# Patient Record
Sex: Male | Born: 1960
Health system: Southern US, Community
[De-identification: ages and names within clinical notes are randomized; demographics above are authoritative.]

## PROBLEM LIST (undated history)

## (undated) DIAGNOSIS — F419 Anxiety disorder, unspecified: Secondary | ICD-10-CM

## (undated) DIAGNOSIS — Z9889 Other specified postprocedural states: Secondary | ICD-10-CM

## (undated) DIAGNOSIS — F1010 Alcohol abuse, uncomplicated: Secondary | ICD-10-CM

## (undated) DIAGNOSIS — F10931 Alcohol use, unspecified with withdrawal delirium: Secondary | ICD-10-CM

## (undated) DIAGNOSIS — R251 Tremor, unspecified: Secondary | ICD-10-CM

## (undated) DIAGNOSIS — I491 Atrial premature depolarization: Secondary | ICD-10-CM

## (undated) DIAGNOSIS — I340 Nonrheumatic mitral (valve) insufficiency: Secondary | ICD-10-CM

## (undated) DIAGNOSIS — C8590 Non-Hodgkin lymphoma, unspecified, unspecified site: Secondary | ICD-10-CM

## (undated) DIAGNOSIS — F10231 Alcohol dependence with withdrawal delirium: Secondary | ICD-10-CM

## (undated) DIAGNOSIS — R011 Cardiac murmur, unspecified: Secondary | ICD-10-CM

## (undated) DIAGNOSIS — M1990 Unspecified osteoarthritis, unspecified site: Secondary | ICD-10-CM

## (undated) DIAGNOSIS — I2699 Other pulmonary embolism without acute cor pulmonale: Secondary | ICD-10-CM

## (undated) DIAGNOSIS — R0600 Dyspnea, unspecified: Secondary | ICD-10-CM

## (undated) DIAGNOSIS — I471 Supraventricular tachycardia: Secondary | ICD-10-CM

## (undated) DIAGNOSIS — F32A Depression, unspecified: Secondary | ICD-10-CM

## (undated) DIAGNOSIS — F329 Major depressive disorder, single episode, unspecified: Secondary | ICD-10-CM

## (undated) DIAGNOSIS — I441 Atrioventricular block, second degree: Secondary | ICD-10-CM

## (undated) DIAGNOSIS — N529 Male erectile dysfunction, unspecified: Secondary | ICD-10-CM

## (undated) DIAGNOSIS — I10 Essential (primary) hypertension: Secondary | ICD-10-CM

## (undated) HISTORY — DX: Other pulmonary embolism without acute cor pulmonale: I26.99

## (undated) HISTORY — DX: Non-Hodgkin lymphoma, unspecified, unspecified site: C85.90

## (undated) HISTORY — DX: Cardiac murmur, unspecified: R01.1

## (undated) HISTORY — DX: Male erectile dysfunction, unspecified: N52.9

## (undated) HISTORY — DX: Atrioventricular block, second degree: I44.1

## (undated) HISTORY — DX: Nonrheumatic mitral (valve) insufficiency: I34.0

## (undated) HISTORY — DX: Supraventricular tachycardia: I47.1

## (undated) HISTORY — PX: TRIGGER FINGER RELEASE: SHX641

## (undated) HISTORY — DX: Atrial premature depolarization: I49.1

## (undated) SURGERY — ECHOCARDIOGRAM, TRANSESOPHAGEAL
Anesthesia: Moderate Sedation

---

## 2003-10-21 ENCOUNTER — Encounter: Admission: RE | Admit: 2003-10-21 | Discharge: 2003-10-21 | Payer: Self-pay | Admitting: Family Medicine

## 2006-11-16 ENCOUNTER — Ambulatory Visit: Payer: Self-pay | Admitting: Family Medicine

## 2007-02-17 ENCOUNTER — Ambulatory Visit: Payer: Self-pay | Admitting: Family Medicine

## 2007-06-19 ENCOUNTER — Ambulatory Visit: Payer: Self-pay | Admitting: Family Medicine

## 2007-06-27 ENCOUNTER — Ambulatory Visit: Payer: Self-pay | Admitting: Family Medicine

## 2008-01-01 ENCOUNTER — Ambulatory Visit: Payer: Self-pay | Admitting: Family Medicine

## 2008-02-07 ENCOUNTER — Ambulatory Visit: Payer: Self-pay | Admitting: Family Medicine

## 2011-03-23 ENCOUNTER — Ambulatory Visit (INDEPENDENT_AMBULATORY_CARE_PROVIDER_SITE_OTHER): Payer: BC Managed Care – PPO | Admitting: Family Medicine

## 2011-03-23 ENCOUNTER — Encounter: Payer: Self-pay | Admitting: Family Medicine

## 2011-03-23 VITALS — BP 120/80 | HR 78 | Wt 186.0 lb

## 2011-03-23 DIAGNOSIS — N529 Male erectile dysfunction, unspecified: Secondary | ICD-10-CM

## 2011-03-23 DIAGNOSIS — H6120 Impacted cerumen, unspecified ear: Secondary | ICD-10-CM

## 2011-03-23 DIAGNOSIS — H9190 Unspecified hearing loss, unspecified ear: Secondary | ICD-10-CM

## 2011-03-23 NOTE — Progress Notes (Addendum)
  Subjective:    Patient ID: Leonard Bentley, male    DOB: 01/19/1961, 50 y.o.   MRN: [id number]  HPI he complains of decreased ability to here in the past has had cerumen impaction.  Would also like a refill on his Cialis.  Review of Systems     Objective:   Physical Exam Cerumen is noted in both canals and was easily removed. The TMs and canals are normal       Assessment & Plan:  Cerumen impaction ED Return here as needed.

## 2011-03-26 ENCOUNTER — Ambulatory Visit (INDEPENDENT_AMBULATORY_CARE_PROVIDER_SITE_OTHER): Payer: BC Managed Care – PPO | Admitting: Family Medicine

## 2011-03-26 ENCOUNTER — Encounter: Payer: Self-pay | Admitting: Family Medicine

## 2011-03-26 VITALS — BP 116/80 | HR 74 | Wt 186.0 lb

## 2011-03-26 DIAGNOSIS — E291 Testicular hypofunction: Secondary | ICD-10-CM

## 2011-03-26 DIAGNOSIS — M839 Adult osteomalacia, unspecified: Secondary | ICD-10-CM

## 2011-03-26 NOTE — Progress Notes (Signed)
  Subjective:    Patient ID: Leonard Bentley, male    DOB: 10-26-1961, 50 y.o.   MRN: [id number]  HPI he is here for a consultation concerning recent blood work which showed a low vitamin D level as well as a testosterone level of 300. He does complain of decreased energy as well as libido. He was placed on vitamin D 50,000 units. He also last weekend had a rather large amount of alcohol.   Review of Systems     Objective:   Physical Exam alert and in no distress otherwise not examined        Assessment & Plan:  Possible hypogonadism. Vitamin D. Deficiency. I discussed the diagnosis and treatment of hypogonadism. He will have this checked again in several weeks and have a morning reading. 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 He is to continue on vitamin D supplement as well as a multivitamin. Encouraged him to avoid excessive alcohol consumption. Over 20 minutes spent discussing these issues with him.

## 2011-03-26 NOTE — Patient Instructions (Signed)
Have your blood drawn at work for testosterone percent free testosterone. Use a good multivitamin. Don't waster money on protein supplements

## 2011-06-02 ENCOUNTER — Other Ambulatory Visit: Payer: BC Managed Care – PPO

## 2011-06-03 LAB — TESTOSTERONE, FREE, TOTAL, SHBG
Sex Hormone Binding: 44 nmol/L (ref 13–71)
Testosterone, Free: 52.1 pg/mL (ref 47.0–244.0)
Testosterone-% Free: 1.6 % (ref 1.6–2.9)
Testosterone: 316.63 ng/dL (ref 250–890)

## 2011-07-29 ENCOUNTER — Encounter: Payer: BC Managed Care – PPO | Admitting: Internal Medicine

## 2011-08-03 ENCOUNTER — Other Ambulatory Visit: Payer: Self-pay | Admitting: Internal Medicine

## 2011-08-03 ENCOUNTER — Encounter (HOSPITAL_BASED_OUTPATIENT_CLINIC_OR_DEPARTMENT_OTHER): Payer: BC Managed Care – PPO | Admitting: Internal Medicine

## 2011-08-03 DIAGNOSIS — C8190 Hodgkin lymphoma, unspecified, unspecified site: Secondary | ICD-10-CM

## 2011-08-03 DIAGNOSIS — C8581 Other specified types of non-Hodgkin lymphoma, lymph nodes of head, face, and neck: Secondary | ICD-10-CM

## 2011-08-03 LAB — CBC WITH DIFFERENTIAL/PLATELET
BASO%: 1.1 % (ref 0.0–2.0)
Basophils Absolute: 0 10*3/uL (ref 0.0–0.1)
EOS%: 0.4 % (ref 0.0–7.0)
Eosinophils Absolute: 0 10*3/uL (ref 0.0–0.5)
HCT: 37.2 % — ABNORMAL LOW (ref 38.4–49.9)
HGB: 12.3 g/dL — ABNORMAL LOW (ref 13.0–17.1)
LYMPH%: 22.1 % (ref 14.0–49.0)
MCH: 26.4 pg — ABNORMAL LOW (ref 27.2–33.4)
MCHC: 33.2 g/dL (ref 32.0–36.0)
MCV: 79.7 fL (ref 79.3–98.0)
MONO#: 0.5 10*3/uL (ref 0.1–0.9)
MONO%: 15.7 % — ABNORMAL HIGH (ref 0.0–14.0)
NEUT#: 2.1 10*3/uL (ref 1.5–6.5)
NEUT%: 60.7 % (ref 39.0–75.0)
Platelets: 233 10*3/uL (ref 140–400)
RBC: 4.66 10*6/uL (ref 4.20–5.82)
RDW: 15.8 % — ABNORMAL HIGH (ref 11.0–14.6)
WBC: 3.5 10*3/uL — ABNORMAL LOW (ref 4.0–10.3)
lymph#: 0.8 10*3/uL — ABNORMAL LOW (ref 0.9–3.3)

## 2011-08-03 LAB — LACTATE DEHYDROGENASE: LDH: 195 U/L (ref 94–250)

## 2011-08-03 LAB — COMPREHENSIVE METABOLIC PANEL
ALT: 32 U/L (ref 0–53)
AST: 52 U/L — ABNORMAL HIGH (ref 0–37)
Albumin: 4.3 g/dL (ref 3.5–5.2)
Alkaline Phosphatase: 34 U/L — ABNORMAL LOW (ref 39–117)
BUN: 12 mg/dL (ref 6–23)
CO2: 28 mEq/L (ref 19–32)
Calcium: 9.1 mg/dL (ref 8.4–10.5)
Chloride: 104 mEq/L (ref 96–112)
Creatinine, Ser: 0.92 mg/dL (ref 0.50–1.35)
Glucose, Bld: 83 mg/dL (ref 70–99)
Potassium: 4.2 mEq/L (ref 3.5–5.3)
Sodium: 142 mEq/L (ref 135–145)
Total Bilirubin: 0.5 mg/dL (ref 0.3–1.2)
Total Protein: 7 g/dL (ref 6.0–8.3)

## 2011-08-03 LAB — URIC ACID: Uric Acid, Serum: 4.6 mg/dL (ref 4.0–7.8)

## 2011-08-09 ENCOUNTER — Encounter (HOSPITAL_COMMUNITY)
Admission: RE | Admit: 2011-08-09 | Discharge: 2011-08-09 | Disposition: A | Discharge status: Home / Self Care | Payer: BC Managed Care – PPO | Source: Ambulatory Visit | Attending: Internal Medicine | Admitting: Internal Medicine

## 2011-08-09 DIAGNOSIS — C8190 Hodgkin lymphoma, unspecified, unspecified site: Secondary | ICD-10-CM

## 2011-08-09 DIAGNOSIS — C8589 Other specified types of non-Hodgkin lymphoma, extranodal and solid organ sites: Secondary | ICD-10-CM | POA: Insufficient documentation

## 2011-08-09 DIAGNOSIS — J984 Other disorders of lung: Secondary | ICD-10-CM | POA: Insufficient documentation

## 2011-08-09 DIAGNOSIS — C8590 Non-Hodgkin lymphoma, unspecified, unspecified site: Secondary | ICD-10-CM

## 2011-08-09 DIAGNOSIS — R599 Enlarged lymph nodes, unspecified: Secondary | ICD-10-CM | POA: Insufficient documentation

## 2011-08-09 MED ORDER — IOHEXOL 300 MG/ML  SOLN
100.0000 mL | Freq: Once | INTRAMUSCULAR | Status: AC | PRN
  Administered 2011-08-09: 100 mL via INTRAVENOUS

## 2011-08-09 MED ORDER — FLUDEOXYGLUCOSE F - 18 (FDG) INJECTION
15.2000 | Freq: Once | INTRAVENOUS | Status: AC | PRN
  Administered 2011-08-09: 15.2 via INTRAVENOUS

## 2011-08-10 ENCOUNTER — Encounter (HOSPITAL_BASED_OUTPATIENT_CLINIC_OR_DEPARTMENT_OTHER): Payer: BC Managed Care – PPO | Admitting: Internal Medicine

## 2011-08-10 ENCOUNTER — Other Ambulatory Visit: Payer: Self-pay | Admitting: Internal Medicine

## 2011-08-10 DIAGNOSIS — C8581 Other specified types of non-Hodgkin lymphoma, lymph nodes of head, face, and neck: Secondary | ICD-10-CM

## 2011-08-10 LAB — CBC WITH DIFFERENTIAL/PLATELET
BASO%: 0.6 % (ref 0.0–2.0)
Basophils Absolute: 0 10*3/uL (ref 0.0–0.1)
EOS%: 0.6 % (ref 0.0–7.0)
Eosinophils Absolute: 0 10*3/uL (ref 0.0–0.5)
HCT: 38.8 % (ref 38.4–49.9)
HGB: 12.7 g/dL — ABNORMAL LOW (ref 13.0–17.1)
LYMPH%: 14.1 % (ref 14.0–49.0)
MCH: 26 pg — ABNORMAL LOW (ref 27.2–33.4)
MCHC: 32.7 g/dL (ref 32.0–36.0)
MCV: 79.4 fL (ref 79.3–98.0)
MONO#: 0.5 10*3/uL (ref 0.1–0.9)
MONO%: 11.7 % (ref 0.0–14.0)
NEUT#: 3.1 10*3/uL (ref 1.5–6.5)
NEUT%: 73 % (ref 39.0–75.0)
Platelets: 181 10*3/uL (ref 140–400)
RBC: 4.89 10*6/uL (ref 4.20–5.82)
RDW: 15.5 % — ABNORMAL HIGH (ref 11.0–14.6)
WBC: 4.2 10*3/uL (ref 4.0–10.3)
lymph#: 0.6 10*3/uL — ABNORMAL LOW (ref 0.9–3.3)

## 2011-08-10 LAB — COMPREHENSIVE METABOLIC PANEL
ALT: 43 U/L (ref 0–53)
AST: 56 U/L — ABNORMAL HIGH (ref 0–37)
Albumin: 4.8 g/dL (ref 3.5–5.2)
Alkaline Phosphatase: 39 U/L (ref 39–117)
BUN: 17 mg/dL (ref 6–23)
CO2: 26 mEq/L (ref 19–32)
Calcium: 10 mg/dL (ref 8.4–10.5)
Chloride: 98 mEq/L (ref 96–112)
Creatinine, Ser: 1.06 mg/dL (ref 0.50–1.35)
Glucose, Bld: 94 mg/dL (ref 70–99)
Potassium: 4.3 mEq/L (ref 3.5–5.3)
Sodium: 140 mEq/L (ref 135–145)
Total Bilirubin: 1.1 mg/dL (ref 0.3–1.2)
Total Protein: 7.5 g/dL (ref 6.0–8.3)

## 2011-08-10 LAB — GLUCOSE, CAPILLARY: Glucose-Capillary: 104 mg/dL — ABNORMAL HIGH (ref 70–99)

## 2011-08-10 LAB — LACTATE DEHYDROGENASE: LDH: 224 U/L (ref 94–250)

## 2011-08-11 ENCOUNTER — Encounter: Payer: Self-pay | Admitting: Family Medicine

## 2011-08-11 ENCOUNTER — Ambulatory Visit (HOSPITAL_COMMUNITY)
Admission: RE | Admit: 2011-08-11 | Discharge: 2011-08-11 | Disposition: A | Discharge status: Home / Self Care | Payer: BC Managed Care – PPO | Source: Ambulatory Visit | Attending: Family Medicine | Admitting: Family Medicine

## 2011-08-11 DIAGNOSIS — I517 Cardiomegaly: Secondary | ICD-10-CM | POA: Insufficient documentation

## 2011-08-13 ENCOUNTER — Other Ambulatory Visit: Payer: Self-pay | Admitting: Internal Medicine

## 2011-08-13 ENCOUNTER — Ambulatory Visit (HOSPITAL_COMMUNITY): Payer: BC Managed Care – PPO | Attending: Internal Medicine

## 2011-08-13 DIAGNOSIS — C8589 Other specified types of non-Hodgkin lymphoma, extranodal and solid organ sites: Secondary | ICD-10-CM

## 2011-08-13 LAB — DIFFERENTIAL
Basophils Absolute: 0 10*3/uL (ref 0.0–0.1)
Basophils Relative: 1 % (ref 0–1)
Eosinophils Absolute: 0.1 10*3/uL (ref 0.0–0.7)
Eosinophils Relative: 2 % (ref 0–5)
Lymphocytes Relative: 20 % (ref 12–46)
Lymphs Abs: 0.9 10*3/uL (ref 0.7–4.0)
Monocytes Absolute: 0.6 10*3/uL (ref 0.1–1.0)
Monocytes Relative: 13 % — ABNORMAL HIGH (ref 3–12)
Neutro Abs: 2.8 10*3/uL (ref 1.7–7.7)
Neutrophils Relative %: 65 % (ref 43–77)

## 2011-08-13 LAB — CBC
HCT: 37.2 % — ABNORMAL LOW (ref 39.0–52.0)
Hemoglobin: 12.4 g/dL — ABNORMAL LOW (ref 13.0–17.0)
MCH: 25.7 pg — ABNORMAL LOW (ref 26.0–34.0)
MCHC: 33.3 g/dL (ref 30.0–36.0)
MCV: 77.2 fL — ABNORMAL LOW (ref 78.0–100.0)
Platelets: 179 10*3/uL (ref 150–400)
RBC: 4.82 MIL/uL (ref 4.22–5.81)
RDW: 15.8 % — ABNORMAL HIGH (ref 11.5–15.5)
WBC: 4.3 10*3/uL (ref 4.0–10.5)

## 2011-08-17 ENCOUNTER — Other Ambulatory Visit: Payer: Self-pay | Admitting: Internal Medicine

## 2011-08-17 ENCOUNTER — Encounter (HOSPITAL_BASED_OUTPATIENT_CLINIC_OR_DEPARTMENT_OTHER): Payer: BC Managed Care – PPO | Admitting: Internal Medicine

## 2011-08-17 DIAGNOSIS — C8581 Other specified types of non-Hodgkin lymphoma, lymph nodes of head, face, and neck: Secondary | ICD-10-CM

## 2011-08-17 DIAGNOSIS — Z5111 Encounter for antineoplastic chemotherapy: Secondary | ICD-10-CM

## 2011-08-17 LAB — CBC WITH DIFFERENTIAL/PLATELET
BASO%: 0.6 % (ref 0.0–2.0)
Basophils Absolute: 0 10*3/uL (ref 0.0–0.1)
EOS%: 0.4 % (ref 0.0–7.0)
Eosinophils Absolute: 0 10*3/uL (ref 0.0–0.5)
HCT: 38.9 % (ref 38.4–49.9)
HGB: 12.9 g/dL — ABNORMAL LOW (ref 13.0–17.1)
LYMPH%: 14.3 % (ref 14.0–49.0)
MCH: 25.5 pg — ABNORMAL LOW (ref 27.2–33.4)
MCHC: 33.2 g/dL (ref 32.0–36.0)
MCV: 77 fL — ABNORMAL LOW (ref 79.3–98.0)
MONO#: 0.6 10*3/uL (ref 0.1–0.9)
MONO%: 12 % (ref 0.0–14.0)
NEUT#: 3.5 10*3/uL (ref 1.5–6.5)
NEUT%: 72.7 % (ref 39.0–75.0)
Platelets: 158 10*3/uL (ref 140–400)
RBC: 5.05 10*6/uL (ref 4.20–5.82)
RDW: 15.6 % — ABNORMAL HIGH (ref 11.0–14.6)
WBC: 4.8 10*3/uL (ref 4.0–10.3)
lymph#: 0.7 10*3/uL — ABNORMAL LOW (ref 0.9–3.3)

## 2011-08-18 ENCOUNTER — Encounter (HOSPITAL_BASED_OUTPATIENT_CLINIC_OR_DEPARTMENT_OTHER): Payer: BC Managed Care – PPO | Admitting: Internal Medicine

## 2011-08-18 DIAGNOSIS — C8581 Other specified types of non-Hodgkin lymphoma, lymph nodes of head, face, and neck: Secondary | ICD-10-CM

## 2011-08-18 LAB — COMPREHENSIVE METABOLIC PANEL
ALT: 37 U/L (ref 0–53)
AST: 70 U/L — ABNORMAL HIGH (ref 0–37)
Albumin: 5.2 g/dL (ref 3.5–5.2)
Alkaline Phosphatase: 41 U/L (ref 39–117)
BUN: 16 mg/dL (ref 6–23)
CO2: 27 mEq/L (ref 19–32)
Calcium: 10.4 mg/dL (ref 8.4–10.5)
Chloride: 96 mEq/L (ref 96–112)
Creatinine, Ser: 1.09 mg/dL (ref 0.50–1.35)
Glucose, Bld: 85 mg/dL (ref 70–99)
Potassium: 4.4 mEq/L (ref 3.5–5.3)
Sodium: 136 mEq/L (ref 135–145)
Total Bilirubin: 1.2 mg/dL (ref 0.3–1.2)
Total Protein: 8.3 g/dL (ref 6.0–8.3)

## 2011-08-18 LAB — HEPATITIS PANEL, ACUTE
HCV Ab: NEGATIVE
Hep A IgM: NEGATIVE
Hep B C IgM: NEGATIVE
Hepatitis B Surface Ag: NEGATIVE

## 2011-08-18 LAB — URIC ACID: Uric Acid, Serum: 5.3 mg/dL (ref 4.0–7.8)

## 2011-08-18 LAB — HIV ANTIBODY (ROUTINE TESTING W REFLEX): HIV: NONREACTIVE

## 2011-08-18 LAB — LACTATE DEHYDROGENASE: LDH: 299 U/L — ABNORMAL HIGH (ref 94–250)

## 2011-08-24 ENCOUNTER — Encounter (HOSPITAL_BASED_OUTPATIENT_CLINIC_OR_DEPARTMENT_OTHER): Payer: BC Managed Care – PPO | Admitting: Internal Medicine

## 2011-08-24 ENCOUNTER — Other Ambulatory Visit: Payer: Self-pay | Admitting: Internal Medicine

## 2011-08-24 DIAGNOSIS — C8581 Other specified types of non-Hodgkin lymphoma, lymph nodes of head, face, and neck: Secondary | ICD-10-CM

## 2011-08-24 LAB — COMPREHENSIVE METABOLIC PANEL
ALT: 30 U/L (ref 0–53)
AST: 34 U/L (ref 0–37)
Albumin: 3.5 g/dL (ref 3.5–5.2)
Alkaline Phosphatase: 52 U/L (ref 39–117)
BUN: 28 mg/dL — ABNORMAL HIGH (ref 6–23)
CO2: 30 mEq/L (ref 19–32)
Calcium: 9.9 mg/dL (ref 8.4–10.5)
Chloride: 98 mEq/L (ref 96–112)
Creatinine, Ser: 1.21 mg/dL (ref 0.50–1.35)
Glucose, Bld: 110 mg/dL — ABNORMAL HIGH (ref 70–99)
Potassium: 3.7 mEq/L (ref 3.5–5.3)
Sodium: 137 mEq/L (ref 135–145)
Total Bilirubin: 0.7 mg/dL (ref 0.3–1.2)
Total Protein: 6.8 g/dL (ref 6.0–8.3)

## 2011-08-24 LAB — CBC WITH DIFFERENTIAL/PLATELET
BASO%: 0.8 % (ref 0.0–2.0)
Basophils Absolute: 0 10*3/uL (ref 0.0–0.1)
EOS%: 3.5 % (ref 0.0–7.0)
Eosinophils Absolute: 0.1 10*3/uL (ref 0.0–0.5)
HCT: 31.9 % — ABNORMAL LOW (ref 38.4–49.9)
HGB: 10.5 g/dL — ABNORMAL LOW (ref 13.0–17.1)
LYMPH%: 41.2 % (ref 14.0–49.0)
MCH: 26.4 pg — ABNORMAL LOW (ref 27.2–33.4)
MCHC: 32.9 g/dL (ref 32.0–36.0)
MCV: 80.2 fL (ref 79.3–98.0)
MONO#: 0.4 10*3/uL (ref 0.1–0.9)
MONO%: 18.3 % — ABNORMAL HIGH (ref 0.0–14.0)
NEUT#: 0.8 10*3/uL — ABNORMAL LOW (ref 1.5–6.5)
NEUT%: 36.2 % — ABNORMAL LOW (ref 39.0–75.0)
Platelets: 111 10*3/uL — ABNORMAL LOW (ref 140–400)
RBC: 3.98 10*6/uL — ABNORMAL LOW (ref 4.20–5.82)
RDW: 14.1 % (ref 11.0–14.6)
WBC: 2.1 10*3/uL — ABNORMAL LOW (ref 4.0–10.3)
lymph#: 0.9 10*3/uL (ref 0.9–3.3)

## 2011-08-24 LAB — URIC ACID: Uric Acid, Serum: 3.8 mg/dL — ABNORMAL LOW (ref 4.0–7.8)

## 2011-08-24 LAB — LACTATE DEHYDROGENASE: LDH: 195 U/L (ref 94–250)

## 2011-08-31 ENCOUNTER — Other Ambulatory Visit: Payer: Self-pay | Admitting: Internal Medicine

## 2011-08-31 ENCOUNTER — Encounter (HOSPITAL_BASED_OUTPATIENT_CLINIC_OR_DEPARTMENT_OTHER): Payer: BC Managed Care – PPO | Admitting: Internal Medicine

## 2011-08-31 DIAGNOSIS — C8581 Other specified types of non-Hodgkin lymphoma, lymph nodes of head, face, and neck: Secondary | ICD-10-CM

## 2011-08-31 LAB — COMPREHENSIVE METABOLIC PANEL
ALT: 32 U/L (ref 0–53)
AST: 46 U/L — ABNORMAL HIGH (ref 0–37)
Albumin: 4.4 g/dL (ref 3.5–5.2)
Alkaline Phosphatase: 62 U/L (ref 39–117)
BUN: 18 mg/dL (ref 6–23)
CO2: 28 mEq/L (ref 19–32)
Calcium: 9.4 mg/dL (ref 8.4–10.5)
Chloride: 103 mEq/L (ref 96–112)
Creatinine, Ser: 1.13 mg/dL (ref 0.50–1.35)
Glucose, Bld: 84 mg/dL (ref 70–99)
Potassium: 4.4 mEq/L (ref 3.5–5.3)
Sodium: 143 mEq/L (ref 135–145)
Total Bilirubin: 0.3 mg/dL (ref 0.3–1.2)
Total Protein: 6.6 g/dL (ref 6.0–8.3)

## 2011-08-31 LAB — CBC WITH DIFFERENTIAL/PLATELET
BASO%: 1 % (ref 0.0–2.0)
Basophils Absolute: 0.1 10*3/uL (ref 0.0–0.1)
EOS%: 0 % (ref 0.0–7.0)
Eosinophils Absolute: 0 10*3/uL (ref 0.0–0.5)
HCT: 34.1 % — ABNORMAL LOW (ref 38.4–49.9)
HGB: 11.1 g/dL — ABNORMAL LOW (ref 13.0–17.1)
LYMPH%: 11.4 % — ABNORMAL LOW (ref 14.0–49.0)
MCH: 25.5 pg — ABNORMAL LOW (ref 27.2–33.4)
MCHC: 32.6 g/dL (ref 32.0–36.0)
MCV: 78.2 fL — ABNORMAL LOW (ref 79.3–98.0)
MONO#: 0.8 10*3/uL (ref 0.1–0.9)
MONO%: 11.3 % (ref 0.0–14.0)
NEUT#: 5.3 10*3/uL (ref 1.5–6.5)
NEUT%: 76.3 % — ABNORMAL HIGH (ref 39.0–75.0)
Platelets: 154 10*3/uL (ref 140–400)
RBC: 4.36 10*6/uL (ref 4.20–5.82)
RDW: 14.6 % (ref 11.0–14.6)
WBC: 7 10*3/uL (ref 4.0–10.3)
lymph#: 0.8 10*3/uL — ABNORMAL LOW (ref 0.9–3.3)
nRBC: 22 % — ABNORMAL HIGH (ref 0–0)

## 2011-08-31 LAB — URIC ACID: Uric Acid, Serum: 3.2 mg/dL — ABNORMAL LOW (ref 4.0–7.8)

## 2011-08-31 LAB — LACTATE DEHYDROGENASE: LDH: 279 U/L — ABNORMAL HIGH (ref 94–250)

## 2011-09-07 ENCOUNTER — Encounter (HOSPITAL_BASED_OUTPATIENT_CLINIC_OR_DEPARTMENT_OTHER): Payer: BC Managed Care – PPO | Admitting: Internal Medicine

## 2011-09-07 ENCOUNTER — Other Ambulatory Visit: Payer: Self-pay | Admitting: Internal Medicine

## 2011-09-07 DIAGNOSIS — C8581 Other specified types of non-Hodgkin lymphoma, lymph nodes of head, face, and neck: Secondary | ICD-10-CM

## 2011-09-07 DIAGNOSIS — Z5111 Encounter for antineoplastic chemotherapy: Secondary | ICD-10-CM

## 2011-09-07 LAB — CBC WITH DIFFERENTIAL/PLATELET
BASO%: 1.2 % (ref 0.0–2.0)
Basophils Absolute: 0.1 10*3/uL (ref 0.0–0.1)
EOS%: 0 % (ref 0.0–7.0)
Eosinophils Absolute: 0 10*3/uL (ref 0.0–0.5)
HCT: 35.8 % — ABNORMAL LOW (ref 38.4–49.9)
HGB: 11.6 g/dL — ABNORMAL LOW (ref 13.0–17.1)
LYMPH%: 16.5 % (ref 14.0–49.0)
MCH: 25.2 pg — ABNORMAL LOW (ref 27.2–33.4)
MCHC: 32.4 g/dL (ref 32.0–36.0)
MCV: 77.7 fL — ABNORMAL LOW (ref 79.3–98.0)
MONO#: 0.8 10*3/uL (ref 0.1–0.9)
MONO%: 16.5 % — ABNORMAL HIGH (ref 0.0–14.0)
NEUT#: 3.2 10*3/uL (ref 1.5–6.5)
NEUT%: 65.8 % (ref 39.0–75.0)
Platelets: 209 10*3/uL (ref 140–400)
RBC: 4.61 10*6/uL (ref 4.20–5.82)
RDW: 15.2 % — ABNORMAL HIGH (ref 11.0–14.6)
WBC: 4.9 10*3/uL (ref 4.0–10.3)
lymph#: 0.8 10*3/uL — ABNORMAL LOW (ref 0.9–3.3)
nRBC: 2 % — ABNORMAL HIGH (ref 0–0)

## 2011-09-07 LAB — COMPREHENSIVE METABOLIC PANEL
ALT: 24 U/L (ref 0–53)
AST: 33 U/L (ref 0–37)
Albumin: 4.1 g/dL (ref 3.5–5.2)
Alkaline Phosphatase: 46 U/L (ref 39–117)
BUN: 14 mg/dL (ref 6–23)
CO2: 25 mEq/L (ref 19–32)
Calcium: 9.5 mg/dL (ref 8.4–10.5)
Chloride: 101 mEq/L (ref 96–112)
Creatinine, Ser: 0.88 mg/dL (ref 0.50–1.35)
Glucose, Bld: 137 mg/dL — ABNORMAL HIGH (ref 70–99)
Potassium: 4.3 mEq/L (ref 3.5–5.3)
Sodium: 137 mEq/L (ref 135–145)
Total Bilirubin: 0.4 mg/dL (ref 0.3–1.2)
Total Protein: 6.6 g/dL (ref 6.0–8.3)

## 2011-09-07 LAB — URIC ACID: Uric Acid, Serum: 3.3 mg/dL — ABNORMAL LOW (ref 4.0–7.8)

## 2011-09-07 LAB — LACTATE DEHYDROGENASE: LDH: 213 U/L (ref 94–250)

## 2011-09-08 ENCOUNTER — Encounter (HOSPITAL_BASED_OUTPATIENT_CLINIC_OR_DEPARTMENT_OTHER): Payer: BC Managed Care – PPO | Admitting: Internal Medicine

## 2011-09-08 DIAGNOSIS — C8581 Other specified types of non-Hodgkin lymphoma, lymph nodes of head, face, and neck: Secondary | ICD-10-CM

## 2011-09-13 NOTE — Progress Notes (Unsigned)
CC:   Rocco Serene, M.D. Sharlot Gowda, M.D.  PRINCIPAL DIAGNOSIS:  Stage II, large B-cell non-Hodgkin's lymphoma diagnosed in September 2012 with a scalp lesion.  In addition, mediastinal lymphadenopathy.  CURRENT THERAPY:  Systemic chemotherapy with CHOP/Rituxan given every 3 weeks with Neulasta support, status post 1 cycle.  INTERIM HISTORY:  Mr. Legate presents to proceed with cycle #2 of his systemic chemotherapy with CHOP/Rituxan.  Overall, he has been doing well.  He states that the scalp lesion is now flush to his scalp.  There is no more lump and it is nontender.  He is having some issues with constipation but is doing some dietary changes to adjust for that.  He has not had any problems with night sweats, fever, or chills.  He is still able to work out but is working at about 2/3 effort.  He is accompanied by his significant other, Bonita Quin, today.  He does report a pain that after the Neulasta injection that lasted for approximately 8 days but was well handled with ibuprofen.  REVIEW OF SYMPTOMS:  Significant for constipation as well as some, some generalized pain after the Neulasta injection.  Remainder of the review of systems is negative.  PERFORMANCE STATUS:  ECOG 1.  PHYSICAL EXAMINATION:  Blood pressure 138/92 with a pulse of 68, temperature 98.2.  The patient is seen in the infusion area. General: Exam reveals a very pleasant 50 year old [place] American male who is awake, alert, no acute distress.  HEENT:  Normocephalic, atraumatic. Pupils equal, round, reactive to light.  Sclerae anicteric.  Oropharynx clear.  No evidence of thrush or mucositis.  Neck:  Supple without palpable lymphadenopathy.  No jugular venous distention is present. Respiratory:  Clear to auscultation.  No wheezes, rales or rhonchi. Cardiovascular:  Normal S1, S2 with a regular rate and rhythm.  No appreciable murmurs, gallops, rubs.  Gastrointestinal:  Soft, nontender, nondistended abdomen  without masses or organomegaly.  Lower extremities: Without point tenderness, pitting edema or palpable cords.  LABORATORY DATA:  White count of 4.9 with an ANC of 3.2, hemoglobin 11.6, hematocrit 35.8, platelets 209.  CMET, LDH and uric acid are pending.  ASSESSMENT AND PLAN:  This is a very pleasant 50 year old African American male recently diagnosed with stage II, large B-cell non- Hodgkin's lymphoma from a biopsy of the scalp nodule.  He also had mediastinal and bilateral hilar adenopathy.  The patient was discussed with Dr. Arbutus Ped.  He will proceed with cycle #2 of his systemic chemotherapy with CHOP/Rituxan as scheduled.  He will return in 3 weeks prior to cycle #3, at which time we will set him up to have a CT of the brain, chest, abdomen and pelvis with contrast to re-evaluate his disease after she has completed his 3rd cycle of systemic chemotherapy with CHOP/Rituxan.  He will continued to have weekly labs drawn consisting of a CBC, differential, and CMET and when he returns in 3 weeks with a CBC, differential, CMET, LDH and uric acid.    ______________________________ Tiana Loft, PA-C AJ/MEDQ  D:  09/09/2011  T:  09/13/2011  Job:  [id number]

## 2011-09-14 ENCOUNTER — Other Ambulatory Visit: Payer: Self-pay | Admitting: Internal Medicine

## 2011-09-14 ENCOUNTER — Telehealth: Payer: Self-pay | Admitting: Internal Medicine

## 2011-09-14 ENCOUNTER — Other Ambulatory Visit (HOSPITAL_BASED_OUTPATIENT_CLINIC_OR_DEPARTMENT_OTHER): Payer: BC Managed Care – PPO

## 2011-09-14 DIAGNOSIS — C8581 Other specified types of non-Hodgkin lymphoma, lymph nodes of head, face, and neck: Secondary | ICD-10-CM

## 2011-09-14 LAB — CBC WITH DIFFERENTIAL/PLATELET
BASO%: 2.2 % — ABNORMAL HIGH (ref 0.0–2.0)
Basophils Absolute: 0 10*3/uL (ref 0.0–0.1)
EOS%: 1.7 % (ref 0.0–7.0)
Eosinophils Absolute: 0 10*3/uL (ref 0.0–0.5)
HCT: 30.9 % — ABNORMAL LOW (ref 38.4–49.9)
HGB: 10.2 g/dL — ABNORMAL LOW (ref 13.0–17.1)
LYMPH%: 31.1 % (ref 14.0–49.0)
MCH: 25.3 pg — ABNORMAL LOW (ref 27.2–33.4)
MCHC: 33 g/dL (ref 32.0–36.0)
MCV: 76.7 fL — ABNORMAL LOW (ref 79.3–98.0)
MONO#: 0.2 10*3/uL (ref 0.1–0.9)
MONO%: 10.6 % (ref 0.0–14.0)
NEUT#: 1 10*3/uL — ABNORMAL LOW (ref 1.5–6.5)
NEUT%: 54.4 % (ref 39.0–75.0)
Platelets: 98 10*3/uL — ABNORMAL LOW (ref 140–400)
RBC: 4.03 10*6/uL — ABNORMAL LOW (ref 4.20–5.82)
RDW: 14.4 % (ref 11.0–14.6)
WBC: 1.8 10*3/uL — ABNORMAL LOW (ref 4.0–10.3)
lymph#: 0.6 10*3/uL — ABNORMAL LOW (ref 0.9–3.3)
nRBC: 0 % (ref 0–0)

## 2011-09-14 LAB — COMPREHENSIVE METABOLIC PANEL
ALT: 22 U/L (ref 0–53)
AST: 21 U/L (ref 0–37)
Albumin: 3.5 g/dL (ref 3.5–5.2)
Alkaline Phosphatase: 67 U/L (ref 39–117)
BUN: 23 mg/dL (ref 6–23)
CO2: 32 mEq/L (ref 19–32)
Calcium: 9.4 mg/dL (ref 8.4–10.5)
Chloride: 100 mEq/L (ref 96–112)
Creatinine, Ser: 1.02 mg/dL (ref 0.50–1.35)
Glucose, Bld: 89 mg/dL (ref 70–99)
Potassium: 3.5 mEq/L (ref 3.5–5.3)
Sodium: 140 mEq/L (ref 135–145)
Total Bilirubin: 0.4 mg/dL (ref 0.3–1.2)
Total Protein: 6.7 g/dL (ref 6.0–8.3)

## 2011-09-14 LAB — URIC ACID: Uric Acid, Serum: 3.9 mg/dL — ABNORMAL LOW (ref 4.0–7.8)

## 2011-09-14 LAB — LACTATE DEHYDROGENASE: LDH: 198 U/L (ref 94–250)

## 2011-09-14 NOTE — Progress Notes (Signed)
Quick Note:  Call patient with the result and advise for Neutropenic precautions. ______

## 2011-09-14 NOTE — Telephone Encounter (Signed)
Left message for pt

## 2011-09-21 ENCOUNTER — Other Ambulatory Visit: Payer: BC Managed Care – PPO

## 2011-09-25 ENCOUNTER — Other Ambulatory Visit: Payer: Self-pay | Admitting: Physician Assistant

## 2011-09-25 ENCOUNTER — Other Ambulatory Visit: Payer: Self-pay | Admitting: Internal Medicine

## 2011-09-25 ENCOUNTER — Encounter: Payer: Self-pay | Admitting: Physician Assistant

## 2011-09-25 DIAGNOSIS — C8590 Non-Hodgkin lymphoma, unspecified, unspecified site: Secondary | ICD-10-CM

## 2011-09-25 DIAGNOSIS — Z8572 Personal history of non-Hodgkin lymphomas: Secondary | ICD-10-CM | POA: Insufficient documentation

## 2011-09-25 HISTORY — DX: Non-Hodgkin lymphoma, unspecified, unspecified site: C85.90

## 2011-09-27 ENCOUNTER — Other Ambulatory Visit: Payer: Self-pay | Admitting: Internal Medicine

## 2011-09-27 ENCOUNTER — Other Ambulatory Visit (HOSPITAL_BASED_OUTPATIENT_CLINIC_OR_DEPARTMENT_OTHER): Payer: BC Managed Care – PPO

## 2011-09-27 ENCOUNTER — Ambulatory Visit (HOSPITAL_BASED_OUTPATIENT_CLINIC_OR_DEPARTMENT_OTHER): Payer: BC Managed Care – PPO | Admitting: Physician Assistant

## 2011-09-27 VITALS — BP 137/90 | HR 69 | Temp 98.8°F | Ht 70.0 in | Wt 184.1 lb

## 2011-09-27 DIAGNOSIS — R599 Enlarged lymph nodes, unspecified: Secondary | ICD-10-CM

## 2011-09-27 DIAGNOSIS — C8581 Other specified types of non-Hodgkin lymphoma, lymph nodes of head, face, and neck: Secondary | ICD-10-CM

## 2011-09-27 DIAGNOSIS — C8590 Non-Hodgkin lymphoma, unspecified, unspecified site: Secondary | ICD-10-CM

## 2011-09-27 LAB — COMPREHENSIVE METABOLIC PANEL
ALT: 24 U/L (ref 0–53)
AST: 31 U/L (ref 0–37)
Albumin: 3.9 g/dL (ref 3.5–5.2)
Alkaline Phosphatase: 59 U/L (ref 39–117)
BUN: 20 mg/dL (ref 6–23)
CO2: 28 mEq/L (ref 19–32)
Calcium: 9.6 mg/dL (ref 8.4–10.5)
Chloride: 99 mEq/L (ref 96–112)
Creatinine, Ser: 0.87 mg/dL (ref 0.50–1.35)
Glucose, Bld: 86 mg/dL (ref 70–99)
Potassium: 3.9 mEq/L (ref 3.5–5.3)
Sodium: 136 mEq/L (ref 135–145)
Total Bilirubin: 0.2 mg/dL — ABNORMAL LOW (ref 0.3–1.2)
Total Protein: 7.1 g/dL (ref 6.0–8.3)

## 2011-09-27 LAB — URIC ACID: Uric Acid, Serum: 3.8 mg/dL — ABNORMAL LOW (ref 4.0–7.8)

## 2011-09-27 LAB — CBC WITH DIFFERENTIAL/PLATELET
BASO%: 0.5 % (ref 0.0–2.0)
Basophils Absolute: 0 10*3/uL (ref 0.0–0.1)
EOS%: 0 % (ref 0.0–7.0)
Eosinophils Absolute: 0 10*3/uL (ref 0.0–0.5)
HCT: 32.5 % — ABNORMAL LOW (ref 38.4–49.9)
HGB: 10.6 g/dL — ABNORMAL LOW (ref 13.0–17.1)
LYMPH%: 12 % — ABNORMAL LOW (ref 14.0–49.0)
MCH: 26 pg — ABNORMAL LOW (ref 27.2–33.4)
MCHC: 32.7 g/dL (ref 32.0–36.0)
MCV: 79.7 fL (ref 79.3–98.0)
MONO#: 0.7 10*3/uL (ref 0.1–0.9)
MONO%: 11.9 % (ref 0.0–14.0)
NEUT#: 4.2 10*3/uL (ref 1.5–6.5)
NEUT%: 75.6 % — ABNORMAL HIGH (ref 39.0–75.0)
Platelets: 203 10*3/uL (ref 140–400)
RBC: 4.08 10*6/uL — ABNORMAL LOW (ref 4.20–5.82)
RDW: 16.3 % — ABNORMAL HIGH (ref 11.0–14.6)
WBC: 5.6 10*3/uL (ref 4.0–10.3)
lymph#: 0.7 10*3/uL — ABNORMAL LOW (ref 0.9–3.3)

## 2011-09-27 LAB — LACTATE DEHYDROGENASE: LDH: 279 U/L — ABNORMAL HIGH (ref 94–250)

## 2011-09-28 ENCOUNTER — Ambulatory Visit (HOSPITAL_BASED_OUTPATIENT_CLINIC_OR_DEPARTMENT_OTHER): Payer: BC Managed Care – PPO

## 2011-09-28 VITALS — BP 121/82 | HR 75 | Temp 98.6°F | Ht 70.0 in | Wt 184.0 lb

## 2011-09-28 DIAGNOSIS — C8581 Other specified types of non-Hodgkin lymphoma, lymph nodes of head, face, and neck: Secondary | ICD-10-CM

## 2011-09-28 DIAGNOSIS — Z5111 Encounter for antineoplastic chemotherapy: Secondary | ICD-10-CM

## 2011-09-28 DIAGNOSIS — Z5112 Encounter for antineoplastic immunotherapy: Secondary | ICD-10-CM

## 2011-09-28 DIAGNOSIS — C8590 Non-Hodgkin lymphoma, unspecified, unspecified site: Secondary | ICD-10-CM

## 2011-09-28 MED ORDER — ONDANSETRON 16 MG/50ML IVPB (CHCC)
16.0000 mg | Freq: Once | INTRAVENOUS | Status: AC
  Administered 2011-09-28: 16 mg via INTRAVENOUS

## 2011-09-28 MED ORDER — VINCRISTINE SULFATE CHEMO INJECTION 1 MG/ML
2.0000 mg | Freq: Once | INTRAVENOUS | Status: AC
  Administered 2011-09-28: 2 mg via INTRAVENOUS
  Filled 2011-09-28: qty 2

## 2011-09-28 MED ORDER — SODIUM CHLORIDE 0.9 % IV SOLN
750.0000 mg/m2 | Freq: Once | INTRAVENOUS | Status: AC
  Administered 2011-09-28: 1520 mg via INTRAVENOUS
  Filled 2011-09-28: qty 76

## 2011-09-28 MED ORDER — DIPHENHYDRAMINE HCL 25 MG PO CAPS
50.0000 mg | ORAL_CAPSULE | Freq: Once | ORAL | Status: AC
  Administered 2011-09-28: 50 mg via ORAL

## 2011-09-28 MED ORDER — SODIUM CHLORIDE 0.9 % IV SOLN: 375.0000 mg/m2 | Freq: Once | INTRAVENOUS | Status: DC

## 2011-09-28 MED ORDER — SODIUM CHLORIDE 0.9 % IV SOLN
375.0000 mg/m2 | Freq: Once | INTRAVENOUS | Status: AC
  Administered 2011-09-28: 800 mg via INTRAVENOUS
  Filled 2011-09-28: qty 80

## 2011-09-28 MED ORDER — SODIUM CHLORIDE 0.9 % IV SOLN
Freq: Once | INTRAVENOUS | Status: AC
  Administered 2011-09-28: 09:00:00 via INTRAVENOUS

## 2011-09-28 MED ORDER — DOXORUBICIN HCL CHEMO IV INJECTION 2 MG/ML
50.0000 mg/m2 | Freq: Once | INTRAVENOUS | Status: AC
  Administered 2011-09-28: 102 mg via INTRAVENOUS
  Filled 2011-09-28: qty 51

## 2011-09-28 MED ORDER — DEXAMETHASONE SODIUM PHOSPHATE 4 MG/ML IJ SOLN
20.0000 mg | Freq: Once | INTRAMUSCULAR | Status: AC
Start: 2011-09-28 — End: 2011-09-28
  Administered 2011-09-28: 20 mg via INTRAVENOUS

## 2011-09-28 MED ORDER — ACETAMINOPHEN 325 MG PO TABS
650.0000 mg | ORAL_TABLET | Freq: Once | ORAL | Status: AC
  Administered 2011-09-28: 650 mg via ORAL

## 2011-09-28 NOTE — Patient Instructions (Signed)
Pt instructed to call for any concerns

## 2011-09-28 NOTE — Progress Notes (Signed)
No images are attached to the encounter. No scans are attached to the encounter. No scans are attached to the encounter. [facility] OFFICE PROGRESS NOTE  Carollee Herter, MD, MD [address] [place] [address]  DIAGNOSIS: Stage II, large B-cell non-Hodgkin's lymphoma diagnosed in September 2012 with a scalp lesion. In addition he had mediastinal lymphadenopathy.  PRIOR THERAPY: None   CURRENT THERAPY: Systemic chemotherapy with CHOP/Rituxan, given every 3 weeks with Neulasta support, status post 2 cycles  INTERVAL HISTORY: Leonard Bentley 50 y.o. male returns for a scheduled regular office visit for followup of his stage II large B cell non-Hodgkin's lymphoma. In general he is tolerating his systemic chemotherapy with CHOP/Rituxan relatively well. He has noticed over the past month that his nose is running more with clear secretions. This is not associated with any fever or chills. He also is aware that his personal body scent has changed since he began chemotherapy. He will be due for restaging scans after this cycle of CHOP/Rituxan. He states he does get some nervousness last anxiousness with the imaging scanning.   MEDICAL HISTORY: Past Medical History  Diagnosis Date  . ED (erectile dysfunction)   . Cancer 07/2011    STAGE II NON- HODGKIN LYMPHOMA  . Lymphoma, non Hodgkin's 09/25/2011    ALLERGIES:   has no known allergies.  MEDICATIONS:  Current Outpatient Prescriptions  Medication Sig Dispense Refill  . allopurinol (ZYLOPRIM) 100 MG tablet Take 100 mg by mouth 3 (three) times daily.        . predniSONE (DELTASONE) 50 MG tablet Take 50 mg by mouth 2 (two) times daily. TAKE 100 MG PO DAILY FOR FIRST 5 DAYS OF EACH CHEM. CYCLE.       Marland Kitchen prochlorperazine (COMPAZINE) 10 MG tablet Take 10 mg by mouth every 6 (six) hours as needed.        . tadalafil (CIALIS) 20 MG tablet Take 20 mg by mouth daily as needed.        No current facility-administered  medications for this visit.   Facility-Administered Medications Ordered in Other Visits  Medication Dose Route Frequency Provider Last Rate Last Dose  . 0.9 %  sodium chloride infusion   Intravenous Once Mohamed K. Mohamed, MD      . acetaminophen (TYLENOL) tablet 650 mg  650 mg Oral Once Mohamed K. Mohamed, MD   650 mg at 09/28/11 1005  . cyclophosphamide (CYTOXAN) 1,520 mg in sodium chloride 0.9 % 250 mL chemo infusion  750 mg/m2 (Treatment Plan Actual) Intravenous Once Mohamed K. Mohamed, MD   1,520 mg at 09/28/11 1045  . dexamethasone (DECADRON) injection 20 mg  20 mg Intravenous Once Mohamed K. Mohamed, MD   20 mg at 09/28/11 1914  . diphenhydrAMINE (BENADRYL) capsule 50 mg  50 mg Oral Once Mohamed K. Mohamed, MD   50 mg at 09/28/11 1054  . DOXOrubicin (ADRIAMYCIN) chemo injection 102 mg  50 mg/m2 (Treatment Plan Actual) Intravenous Once Mohamed K. Mohamed, MD   102 mg at 09/28/11 1021  . ondansetron (ZOFRAN) IVPB 16 mg  16 mg Intravenous Once Mohamed K. Mohamed, MD   16 mg at 09/28/11 7829  . riTUXimab (RITUXAN) 800 mg in sodium chloride 0.9 % 170 mL chemo infusion  375 mg/m2 Intravenous Once Mohamed K. Mohamed, MD   800 mg at 09/28/11 1155  . vinCRIStine (ONCOVIN) 2 mg in sodium chloride 0.9 % 18 mL chemo injection  2 mg Intravenous Once Mohamed K. Arbutus Ped, MD  2 mg at 09/28/11 1040  . DISCONTD: riTUXimab (RITUXAN) 800 mg in sodium chloride 0.9 % 250 mL chemo infusion  375 mg/m2 (Treatment Plan Actual) Intravenous Once Mohamed K. Arbutus Ped, MD        SURGICAL HISTORY: No past surgical history on file.  REVIEW OF SYSTEMS:  A comprehensive review of systems was negative except for: Ears, nose, mouth, throat, and face: positive for Increase in clear ready secretions from the nose   PHYSICAL EXAMINATION: General appearance: alert, cooperative and no distress Head: Normocephalic, without obvious abnormality, atraumatic Neck: no adenopathy, no carotid bruit, no JVD, supple, symmetrical, trachea  midline and thyroid not enlarged, symmetric, no tenderness/mass/nodules Lymph nodes: Cervical, supraclavicular, and axillary nodes normal. Resp: clear to auscultation bilaterally Cardio: regular rate and rhythm, S1, S2 normal, no murmur, click, rub or gallop GI: soft, non-tender; bowel sounds normal; no masses,  no organomegaly Extremities: extremities normal, atraumatic, no cyanosis or edema  ECOG PERFORMANCE STATUS: 1 - Symptomatic but completely ambulatory  Blood pressure 137/90, pulse 69, temperature 98.8 F (37.1 C), height 5\' 10"  (1.778 m), weight 83.507 kg (184 lb 1.6 oz).  LABORATORY DATA: Lab Results  Component Value Date   WBC 5.6 09/27/2011   HGB 10.6* 09/27/2011   HCT 32.5* 09/27/2011   MCV 79.7 09/27/2011   PLT 203 09/27/2011      Chemistry      Component Value Date/Time   NA 136 09/27/2011 1452   NA 136 09/27/2011 1452   K 3.9 09/27/2011 1452   K 3.9 09/27/2011 1452   CL 99 09/27/2011 1452   CL 99 09/27/2011 1452   CO2 28 09/27/2011 1452   CO2 28 09/27/2011 1452   BUN 20 09/27/2011 1452   BUN 20 09/27/2011 1452   CREATININE 0.87 09/27/2011 1452   CREATININE 0.87 09/27/2011 1452      Component Value Date/Time   CALCIUM 9.6 09/27/2011 1452   CALCIUM 9.6 09/27/2011 1452   ALKPHOS 59 09/27/2011 1452   ALKPHOS 59 09/27/2011 1452   AST 31 09/27/2011 1452   AST 31 09/27/2011 1452   ALT 24 09/27/2011 1452   ALT 24 09/27/2011 1452   BILITOT 0.2* 09/27/2011 1452   BILITOT 0.2* 09/27/2011 1452       RADIOGRAPHIC STUDIES:  No results found.  ASSESSMENT/PLAN:  This is a pleasant 50 year old African American male really diagnosed with stage II, large B-cell non-Hodgkin's lymphoma from a biopsy of a scalp nodule. He also had mediastinal and bilateral hilar adenopathy. Patient was discussed with Dr. Arbutus Ped. He will proceed with his third cycle of systemic chemotherapy with CHOP/Rituxan with Neulasta support as scheduled. He'll followup with Dr. Gwenyth Bouillon in 3  weeks with repeat CBC differential C. met and LDH and uric acid as well as a CT of the head with and without contrast CT of the neck chest abdomen and pelvis with contrast to reevaluate his disease. Recommend that he take his Ativan 0.5 mg tablet one hour before his scans and if needed another tablet 30 minutes before his scans for his anxiety.Marland Kitchen     Conni Slipper, PA-C     All questions were answered. The patient knows to call the clinic with any problems, questions or concerns. We can certainly see the patient much sooner if necessary.

## 2011-09-28 NOTE — Progress Notes (Signed)
Excellent blood return before , during and after adriamycin and vincristine

## 2011-09-29 ENCOUNTER — Ambulatory Visit (HOSPITAL_BASED_OUTPATIENT_CLINIC_OR_DEPARTMENT_OTHER): Payer: BC Managed Care – PPO

## 2011-09-29 VITALS — BP 126/78 | HR 75 | Temp 99.5°F

## 2011-09-29 DIAGNOSIS — C8590 Non-Hodgkin lymphoma, unspecified, unspecified site: Secondary | ICD-10-CM

## 2011-09-29 DIAGNOSIS — C8581 Other specified types of non-Hodgkin lymphoma, lymph nodes of head, face, and neck: Secondary | ICD-10-CM

## 2011-09-29 MED ORDER — PEGFILGRASTIM INJECTION 6 MG/0.6ML
6.0000 mg | Freq: Once | SUBCUTANEOUS | Status: AC
  Administered 2011-09-29: 6 mg via SUBCUTANEOUS

## 2011-10-04 ENCOUNTER — Other Ambulatory Visit: Payer: Self-pay | Admitting: Internal Medicine

## 2011-10-05 ENCOUNTER — Other Ambulatory Visit: Payer: Self-pay | Admitting: Internal Medicine

## 2011-10-05 ENCOUNTER — Other Ambulatory Visit (HOSPITAL_BASED_OUTPATIENT_CLINIC_OR_DEPARTMENT_OTHER): Payer: BC Managed Care – PPO | Admitting: Lab

## 2011-10-05 DIAGNOSIS — C8581 Other specified types of non-Hodgkin lymphoma, lymph nodes of head, face, and neck: Secondary | ICD-10-CM

## 2011-10-05 LAB — COMPREHENSIVE METABOLIC PANEL
ALT: 14 U/L (ref 0–53)
AST: 17 U/L (ref 0–37)
Albumin: 3.8 g/dL (ref 3.5–5.2)
Alkaline Phosphatase: 58 U/L (ref 39–117)
BUN: 32 mg/dL — ABNORMAL HIGH (ref 6–23)
CO2: 31 mEq/L (ref 19–32)
Calcium: 9.8 mg/dL (ref 8.4–10.5)
Chloride: 98 mEq/L (ref 96–112)
Creatinine, Ser: 1.13 mg/dL (ref 0.50–1.35)
Glucose, Bld: 96 mg/dL (ref 70–99)
Potassium: 4.4 mEq/L (ref 3.5–5.3)
Sodium: 139 mEq/L (ref 135–145)
Total Bilirubin: 0.5 mg/dL (ref 0.3–1.2)
Total Protein: 6.2 g/dL (ref 6.0–8.3)

## 2011-10-05 LAB — CBC WITH DIFFERENTIAL/PLATELET
BASO%: 1.1 % (ref 0.0–2.0)
Basophils Absolute: 0 10*3/uL (ref 0.0–0.1)
EOS%: 1.5 % (ref 0.0–7.0)
Eosinophils Absolute: 0 10*3/uL (ref 0.0–0.5)
HCT: 28.1 % — ABNORMAL LOW (ref 38.4–49.9)
HGB: 9.2 g/dL — ABNORMAL LOW (ref 13.0–17.1)
LYMPH%: 24.1 % (ref 14.0–49.0)
MCH: 26.1 pg — ABNORMAL LOW (ref 27.2–33.4)
MCHC: 32.7 g/dL (ref 32.0–36.0)
MCV: 79.8 fL (ref 79.3–98.0)
MONO#: 0.1 10*3/uL (ref 0.1–0.9)
MONO%: 4.5 % (ref 0.0–14.0)
NEUT#: 0.9 10*3/uL — ABNORMAL LOW (ref 1.5–6.5)
NEUT%: 68.8 % (ref 39.0–75.0)
Platelets: 81 10*3/uL — ABNORMAL LOW (ref 140–400)
RBC: 3.52 10*6/uL — ABNORMAL LOW (ref 4.20–5.82)
RDW: 16.4 % — ABNORMAL HIGH (ref 11.0–14.6)
WBC: 1.4 10*3/uL — ABNORMAL LOW (ref 4.0–10.3)
lymph#: 0.3 10*3/uL — ABNORMAL LOW (ref 0.9–3.3)

## 2011-10-05 LAB — LACTATE DEHYDROGENASE: LDH: 191 U/L (ref 94–250)

## 2011-10-05 LAB — URIC ACID: Uric Acid, Serum: 4.2 mg/dL (ref 4.0–7.8)

## 2011-10-12 ENCOUNTER — Other Ambulatory Visit: Payer: BC Managed Care – PPO | Admitting: Lab

## 2011-10-14 ENCOUNTER — Other Ambulatory Visit: Payer: Self-pay | Admitting: Internal Medicine

## 2011-10-14 ENCOUNTER — Ambulatory Visit (HOSPITAL_COMMUNITY)
Admission: RE | Admit: 2011-10-14 | Discharge: 2011-10-14 | Disposition: A | Discharge status: Home / Self Care | Payer: BC Managed Care – PPO | Source: Ambulatory Visit | Attending: Physician Assistant | Admitting: Physician Assistant

## 2011-10-14 ENCOUNTER — Other Ambulatory Visit (HOSPITAL_BASED_OUTPATIENT_CLINIC_OR_DEPARTMENT_OTHER): Payer: BC Managed Care – PPO | Admitting: Lab

## 2011-10-14 DIAGNOSIS — C8589 Other specified types of non-Hodgkin lymphoma, extranodal and solid organ sites: Secondary | ICD-10-CM | POA: Insufficient documentation

## 2011-10-14 DIAGNOSIS — R599 Enlarged lymph nodes, unspecified: Secondary | ICD-10-CM | POA: Insufficient documentation

## 2011-10-14 DIAGNOSIS — C8581 Other specified types of non-Hodgkin lymphoma, lymph nodes of head, face, and neck: Secondary | ICD-10-CM

## 2011-10-14 DIAGNOSIS — Z79899 Other long term (current) drug therapy: Secondary | ICD-10-CM | POA: Insufficient documentation

## 2011-10-14 DIAGNOSIS — C8590 Non-Hodgkin lymphoma, unspecified, unspecified site: Secondary | ICD-10-CM

## 2011-10-14 DIAGNOSIS — L989 Disorder of the skin and subcutaneous tissue, unspecified: Secondary | ICD-10-CM | POA: Insufficient documentation

## 2011-10-14 DIAGNOSIS — K7689 Other specified diseases of liver: Secondary | ICD-10-CM | POA: Insufficient documentation

## 2011-10-14 DIAGNOSIS — R918 Other nonspecific abnormal finding of lung field: Secondary | ICD-10-CM | POA: Insufficient documentation

## 2011-10-14 DIAGNOSIS — M899 Disorder of bone, unspecified: Secondary | ICD-10-CM | POA: Insufficient documentation

## 2011-10-14 DIAGNOSIS — M949 Disorder of cartilage, unspecified: Secondary | ICD-10-CM | POA: Insufficient documentation

## 2011-10-14 LAB — CMP (CANCER CENTER ONLY)
ALT(SGPT): 26 U/L (ref 10–47)
AST: 32 U/L (ref 11–38)
Albumin: 3.9 g/dL (ref 3.3–5.5)
Alkaline Phosphatase: 81 U/L (ref 26–84)
BUN, Bld: 16 mg/dL (ref 7–22)
CO2: 30 mEq/L (ref 18–33)
Calcium: 8.7 mg/dL (ref 8.0–10.3)
Chloride: 99 mEq/L (ref 98–108)
Creat: 1 mg/dl (ref 0.6–1.2)
Glucose, Bld: 101 mg/dL (ref 73–118)
Potassium: 4.4 mEq/L (ref 3.3–4.7)
Sodium: 141 mEq/L (ref 128–145)
Total Bilirubin: 0.5 mg/dl (ref 0.20–1.60)
Total Protein: 7.9 g/dL (ref 6.4–8.1)

## 2011-10-14 LAB — CBC WITH DIFFERENTIAL/PLATELET
BASO%: 0.6 % (ref 0.0–2.0)
Basophils Absolute: 0 10*3/uL (ref 0.0–0.1)
EOS%: 0 % (ref 0.0–7.0)
Eosinophils Absolute: 0 10*3/uL (ref 0.0–0.5)
HCT: 33.4 % — ABNORMAL LOW (ref 38.4–49.9)
HGB: 10.9 g/dL — ABNORMAL LOW (ref 13.0–17.1)
LYMPH%: 7.5 % — ABNORMAL LOW (ref 14.0–49.0)
MCH: 25.4 pg — ABNORMAL LOW (ref 27.2–33.4)
MCHC: 32.6 g/dL (ref 32.0–36.0)
MCV: 77.9 fL — ABNORMAL LOW (ref 79.3–98.0)
MONO#: 0.7 10*3/uL (ref 0.1–0.9)
MONO%: 9.2 % (ref 0.0–14.0)
NEUT#: 5.9 10*3/uL (ref 1.5–6.5)
NEUT%: 82.7 % — ABNORMAL HIGH (ref 39.0–75.0)
Platelets: 211 10*3/uL (ref 140–400)
RBC: 4.29 10*6/uL (ref 4.20–5.82)
RDW: 17 % — ABNORMAL HIGH (ref 11.0–14.6)
WBC: 7.1 10*3/uL (ref 4.0–10.3)
lymph#: 0.5 10*3/uL — ABNORMAL LOW (ref 0.9–3.3)
nRBC: 7 % — ABNORMAL HIGH (ref 0–0)

## 2011-10-14 LAB — LACTATE DEHYDROGENASE: LDH: 295 U/L — ABNORMAL HIGH (ref 94–250)

## 2011-10-14 LAB — URIC ACID: Uric Acid, Serum: 3.8 mg/dL — ABNORMAL LOW (ref 4.0–7.8)

## 2011-10-14 MED ORDER — IOHEXOL 300 MG/ML  SOLN
125.0000 mL | Freq: Once | INTRAMUSCULAR | Status: AC | PRN
  Administered 2011-10-14: 125 mL via INTRAVENOUS

## 2011-10-15 ENCOUNTER — Other Ambulatory Visit: Payer: Self-pay | Admitting: Family Medicine

## 2011-10-18 ENCOUNTER — Other Ambulatory Visit: Payer: Self-pay | Admitting: Internal Medicine

## 2011-10-18 ENCOUNTER — Telehealth: Payer: Self-pay | Admitting: *Deleted

## 2011-10-18 ENCOUNTER — Other Ambulatory Visit: Payer: Self-pay

## 2011-10-18 DIAGNOSIS — C8590 Non-Hodgkin lymphoma, unspecified, unspecified site: Secondary | ICD-10-CM

## 2011-10-18 NOTE — Telephone Encounter (Signed)
Pt called stating he is noticing she is more and more fatigued and wants to know if that is normal.  Informed him while on chemotherapy people can become more fatigued and to make sure she does not overdue his daily activities while on active chemo.  He verbalized understanding.  SLJ

## 2011-10-18 NOTE — Telephone Encounter (Signed)
Called med in 

## 2011-10-18 NOTE — Telephone Encounter (Signed)
Is this ok?

## 2011-10-19 ENCOUNTER — Other Ambulatory Visit: Payer: Self-pay | Admitting: Internal Medicine

## 2011-10-19 ENCOUNTER — Other Ambulatory Visit (HOSPITAL_BASED_OUTPATIENT_CLINIC_OR_DEPARTMENT_OTHER): Payer: BC Managed Care – PPO | Admitting: Lab

## 2011-10-19 ENCOUNTER — Ambulatory Visit (HOSPITAL_BASED_OUTPATIENT_CLINIC_OR_DEPARTMENT_OTHER): Payer: BC Managed Care – PPO

## 2011-10-19 VITALS — BP 125/95 | HR 63 | Temp 97.1°F | Ht 70.0 in | Wt 184.0 lb

## 2011-10-19 DIAGNOSIS — C8590 Non-Hodgkin lymphoma, unspecified, unspecified site: Secondary | ICD-10-CM

## 2011-10-19 DIAGNOSIS — C8581 Other specified types of non-Hodgkin lymphoma, lymph nodes of head, face, and neck: Secondary | ICD-10-CM

## 2011-10-19 DIAGNOSIS — Z5112 Encounter for antineoplastic immunotherapy: Secondary | ICD-10-CM

## 2011-10-19 DIAGNOSIS — Z5111 Encounter for antineoplastic chemotherapy: Secondary | ICD-10-CM

## 2011-10-19 LAB — CBC WITH DIFFERENTIAL/PLATELET
BASO%: 1.5 % (ref 0.0–2.0)
Basophils Absolute: 0.1 10*3/uL (ref 0.0–0.1)
EOS%: 0.2 % (ref 0.0–7.0)
Eosinophils Absolute: 0 10*3/uL (ref 0.0–0.5)
HCT: 32.9 % — ABNORMAL LOW (ref 38.4–49.9)
HGB: 10.7 g/dL — ABNORMAL LOW (ref 13.0–17.1)
LYMPH%: 12.3 % — ABNORMAL LOW (ref 14.0–49.0)
MCH: 25.3 pg — ABNORMAL LOW (ref 27.2–33.4)
MCHC: 32.5 g/dL (ref 32.0–36.0)
MCV: 77.8 fL — ABNORMAL LOW (ref 79.3–98.0)
MONO#: 0.6 10*3/uL (ref 0.1–0.9)
MONO%: 12.3 % (ref 0.0–14.0)
NEUT#: 3.3 10*3/uL (ref 1.5–6.5)
NEUT%: 73.7 % (ref 39.0–75.0)
Platelets: 227 10*3/uL (ref 140–400)
RBC: 4.23 10*6/uL (ref 4.20–5.82)
RDW: 17.6 % — ABNORMAL HIGH (ref 11.0–14.6)
WBC: 4.5 10*3/uL (ref 4.0–10.3)
lymph#: 0.6 10*3/uL — ABNORMAL LOW (ref 0.9–3.3)
nRBC: 4 % — ABNORMAL HIGH (ref 0–0)

## 2011-10-19 LAB — COMPREHENSIVE METABOLIC PANEL
ALT: 21 U/L (ref 0–53)
AST: 42 U/L — ABNORMAL HIGH (ref 0–37)
Albumin: 4.3 g/dL (ref 3.5–5.2)
Alkaline Phosphatase: 55 U/L (ref 39–117)
BUN: 17 mg/dL (ref 6–23)
CO2: 25 mEq/L (ref 19–32)
Calcium: 9.8 mg/dL (ref 8.4–10.5)
Chloride: 101 mEq/L (ref 96–112)
Creatinine, Ser: 0.97 mg/dL (ref 0.50–1.35)
Glucose, Bld: 94 mg/dL (ref 70–99)
Potassium: 4 mEq/L (ref 3.5–5.3)
Sodium: 141 mEq/L (ref 135–145)
Total Bilirubin: 0.5 mg/dL (ref 0.3–1.2)
Total Protein: 6.8 g/dL (ref 6.0–8.3)

## 2011-10-19 LAB — URIC ACID: Uric Acid, Serum: 5.3 mg/dL (ref 4.0–7.8)

## 2011-10-19 LAB — LACTATE DEHYDROGENASE: LDH: 292 U/L — ABNORMAL HIGH (ref 94–250)

## 2011-10-19 MED ORDER — ONDANSETRON 16 MG/50ML IVPB (CHCC)
16.0000 mg | Freq: Once | INTRAVENOUS | Status: AC
  Administered 2011-10-19: 16 mg via INTRAVENOUS

## 2011-10-19 MED ORDER — VINCRISTINE SULFATE CHEMO INJECTION 1 MG/ML
2.0000 mg | Freq: Once | INTRAVENOUS | Status: AC
  Administered 2011-10-19: 2 mg via INTRAVENOUS
  Filled 2011-10-19: qty 2

## 2011-10-19 MED ORDER — SODIUM CHLORIDE 0.9 % IV SOLN: 375.0000 mg/m2 | Freq: Once | INTRAVENOUS | Status: DC

## 2011-10-19 MED ORDER — SODIUM CHLORIDE 0.9 % IV SOLN
750.0000 mg/m2 | Freq: Once | INTRAVENOUS | Status: AC
  Administered 2011-10-19: 1520 mg via INTRAVENOUS
  Filled 2011-10-19: qty 76

## 2011-10-19 MED ORDER — DOXORUBICIN HCL CHEMO IV INJECTION 2 MG/ML
50.0000 mg/m2 | Freq: Once | INTRAVENOUS | Status: AC
  Administered 2011-10-19: 102 mg via INTRAVENOUS
  Filled 2011-10-19: qty 51

## 2011-10-19 MED ORDER — ACETAMINOPHEN 325 MG PO TABS
650.0000 mg | ORAL_TABLET | Freq: Once | ORAL | Status: AC
Start: 2011-10-19 — End: 2011-10-19
  Administered 2011-10-19: 650 mg via ORAL

## 2011-10-19 MED ORDER — SODIUM CHLORIDE 0.9 % IV SOLN
Freq: Once | INTRAVENOUS | Status: AC
  Administered 2011-10-19: 09:00:00 via INTRAVENOUS

## 2011-10-19 MED ORDER — DIPHENHYDRAMINE HCL 25 MG PO CAPS
50.0000 mg | ORAL_CAPSULE | Freq: Once | ORAL | Status: AC
  Administered 2011-10-19: 50 mg via ORAL

## 2011-10-19 MED ORDER — DEXAMETHASONE SODIUM PHOSPHATE 4 MG/ML IJ SOLN
20.0000 mg | Freq: Once | INTRAMUSCULAR | Status: AC
  Administered 2011-10-19: 20 mg via INTRAVENOUS

## 2011-10-19 MED ORDER — SODIUM CHLORIDE 0.9 % IV SOLN
375.0000 mg/m2 | Freq: Once | INTRAVENOUS | Status: AC
  Administered 2011-10-19: 800 mg via INTRAVENOUS
  Filled 2011-10-19: qty 80

## 2011-10-20 ENCOUNTER — Telehealth: Payer: Self-pay | Admitting: Internal Medicine

## 2011-10-20 ENCOUNTER — Ambulatory Visit: Payer: BC Managed Care – PPO | Admitting: Internal Medicine

## 2011-10-20 ENCOUNTER — Ambulatory Visit (HOSPITAL_BASED_OUTPATIENT_CLINIC_OR_DEPARTMENT_OTHER): Payer: BC Managed Care – PPO | Admitting: Internal Medicine

## 2011-10-20 DIAGNOSIS — C8590 Non-Hodgkin lymphoma, unspecified, unspecified site: Secondary | ICD-10-CM

## 2011-10-20 DIAGNOSIS — Z5189 Encounter for other specified aftercare: Secondary | ICD-10-CM

## 2011-10-20 DIAGNOSIS — C8589 Other specified types of non-Hodgkin lymphoma, extranodal and solid organ sites: Secondary | ICD-10-CM

## 2011-10-20 MED ORDER — PEGFILGRASTIM INJECTION 6 MG/0.6ML
6.0000 mg | Freq: Once | SUBCUTANEOUS | Status: AC
  Administered 2011-10-20: 6 mg via SUBCUTANEOUS
  Filled 2011-10-20: qty 0.6

## 2011-10-20 NOTE — Telephone Encounter (Signed)
Pt is aware to pick up his dec,jan 2013 appt calendars

## 2011-10-20 NOTE — Progress Notes (Signed)
[facility] OFFICE PROGRESS NOTE  Carollee Herter, MD, MD [address] [place] [address]  DIAGNOSIS: Stage II, large B-cell non-Hodgkin's lymphoma diagnosed in September 2012 with a scalp lesion. In addition he had mediastinal lymphadenopathy.   PRIOR THERAPY: None.   CURRENT THERAPY: Systemic chemotherapy with CHOP/Rituxan, given every 3 weeks with Neulasta support, status post 3 cycles.   INTERVAL HISTORY: Leonard Bentley 50 y.o. male returns to the clinic today for followup visit accompanied by his father. The patient related to previous 3 cycles of systemic chemotherapy with CHOP/Rituxan fairly well. He denied having any significant chest pain or shortness breath. He denied having any weight loss or night sweats, no cough or hemoptysis. He has no nausea or vomiting. The patient has repeat CT scan of the head, chest, abdomen and pelvis performed recently and he is here today for evaluation and discussion of his scan results.  MEDICAL HISTORY: Past Medical History  Diagnosis Date  . ED (erectile dysfunction)   . Cancer 07/2011    STAGE II NON- HODGKIN LYMPHOMA  . Lymphoma, non Hodgkin's 09/25/2011    ALLERGIES:   has no known allergies.  MEDICATIONS:  Current Outpatient Prescriptions  Medication Sig Dispense Refill  . allopurinol (ZYLOPRIM) 100 MG tablet TAKE ONE TABLET BY MOUTH THREE TIMES DAILY  90 tablet  1  . CIALIS 20 MG tablet TAKE 1 TABLET BY MOUTH EVERY Crail AS NEEDED  6 tablet  0  . predniSONE (DELTASONE) 50 MG tablet Take 50 mg by mouth 2 (two) times daily. TAKE 100 MG PO DAILY FOR FIRST 5 DAYS OF EACH CHEM. CYCLE.       Marland Kitchen prochlorperazine (COMPAZINE) 10 MG tablet Take 10 mg by mouth every 6 (six) hours as needed.         No current facility-administered medications for this visit.   Facility-Administered Medications Ordered in Other Visits  Medication Dose Route Frequency Provider Last Rate Last Dose  . 0.9 %  sodium chloride infusion    Intravenous Once Azoria Abbett K. Arbutus Ped, MD      . cyclophosphamide (CYTOXAN) 1,520 mg in sodium chloride 0.9 % 250 mL chemo infusion  750 mg/m2 (Treatment Plan Actual) Intravenous Once Korban Shearer K. Laureano Hetzer, MD   1,520 mg at 10/19/11 1025  . DOXOrubicin (ADRIAMYCIN) chemo injection 102 mg  50 mg/m2 (Treatment Plan Actual) Intravenous Once Rexanna Louthan K. Kasin Tonkinson, MD   102 mg at 10/19/11 0950  . riTUXimab (RITUXAN) 800 mg in sodium chloride 0.9 % 170 mL chemo infusion  375 mg/m2 Intravenous Once Maxmillian Carsey K. Damaso Laday, MD   800 mg at 10/19/11 1102  . vinCRIStine (ONCOVIN) 2 mg in sodium chloride 0.9 % 18 mL chemo injection  2 mg Intravenous Once Lupie Sawa K. Takerra Lupinacci, MD   2 mg at 10/19/11 1015    REVIEW OF SYSTEMS:  A comprehensive review of systems was negative.   PHYSICAL EXAMINATION: General appearance: alert, cooperative and no distress Head: Normocephalic, without obvious abnormality, atraumatic Neck: no adenopathy Lymph nodes: Cervical, supraclavicular, and axillary nodes normal. Resp: clear to auscultation bilaterally Cardio: regular rate and rhythm, S1, S2 normal, no murmur, click, rub or gallop GI: soft, non-tender; bowel sounds normal; no masses,  no organomegaly Extremities: extremities normal, atraumatic, no cyanosis or edema Neurologic: Alert and oriented X 3, normal strength and tone. Normal symmetric reflexes. Normal coordination and gait  ECOG PERFORMANCE STATUS: 0 - Asymptomatic  Blood pressure 143/87, pulse 91, temperature 96.9 F (36.1 C), temperature source Oral, height 5'  10" (1.778 m), weight 187 lb 12.8 oz (85.186 kg).  LABORATORY DATA: Lab Results  Component Value Date   WBC 4.5 10/19/2011   HGB 10.7* 10/19/2011   HCT 32.9* 10/19/2011   MCV 77.8* 10/19/2011   PLT 227 10/19/2011      Chemistry      Component Value Date/Time   NA 141 10/19/2011 0816   NA 141 10/14/2011 0757   K 4.0 10/19/2011 0816   K 4.4 10/14/2011 0757   CL 101 10/19/2011 0816   CL 99 10/14/2011 0757    CO2 25 10/19/2011 0816   CO2 30 10/14/2011 0757   BUN 17 10/19/2011 0816   BUN 16 10/14/2011 0757   CREATININE 0.97 10/19/2011 0816   CREATININE 1.0 10/14/2011 0757      Component Value Date/Time   CALCIUM 9.8 10/19/2011 0816   CALCIUM 8.7 10/14/2011 0757   ALKPHOS 55 10/19/2011 0816   ALKPHOS 81 10/14/2011 0757   AST 42* 10/19/2011 0816   AST 32 10/14/2011 0757   ALT 21 10/19/2011 0816   BILITOT 0.5 10/19/2011 0816   BILITOT 0.50 10/14/2011 0757       RADIOGRAPHIC STUDIES: Ct Head [name]  10/14/2011  *RADIOLOGY REPORT*  Clinical Data:  Restaging non Hodgkin's lymphoma.  Initial diagnosis of September 2012.  Chemotherapy ongoing.  CT HEAD WITH AND WITHOUT CONTRAST CT NECK, CHEST, ABDOMEN, AND PELVIS WITH CONTRAST  Technique:  Multidetector CT imaging of the head was performed following the standard protocol before and during bolus administration of intravenous contrast. Multidetector CT imaging of the neck, chest, abdomen and pelvis was performed following the standard protocol following bolus administration of intravenous contrast.  Contrast: OMNIPAQUE IOHEXOL 300 MG/ML IV SOLN  Comparison:  PET CT scan 08/09/2011  CT HEAD  Findings: There is no enhancing lesions within the brain parenchyma, meningeal surfaces, or ventricles to suggest metastasis.  Orbits are normal.  No aggressive skull lesion.  Within the subcutaneous/cutaneous tissue of the left lateral scalp there is a flattened lesion which is thinned compared to prior measuring 13 x 5 mm compared to 12 x 7 mm.  IMPRESSION: No evidence of brain metastasis.  Flattened subcutaneous lesion in the left lateral scalp is decreased in size.  CT NECK  Findings: There is no cervical lymphadenopathy.  The salivary glands are normal.  The pharyngeal mucosa is symmetric.  Tonsillar pillars are normal.  Orbits are normal.  No aggressive osseous lesions.  IMPRESSION: No evidence of lymphoma recurrence within the neck.  CT CHEST  Findings: No  axillary or supraclavicular lymphadenopathy. Mediastinal lymphadenopathy is similar to prior.  For example,  17 mm precarinal lymph node (image 23) is similar to 19 mm on prior. 9 mm prevascular lymph node (image 23) is similar to 10 mm on prior.  22 mm right hilar lymph node similar to slightly decreased from 23 mm on prior.  No new adenopathy.  No pericardial fluid. The esophagus is normal.  Review of the lung parenchyma demonstrates a 5 mm nodule along the right oblique fissure (image 35).  This is unchanged from comparison.  There is a 6 mm nodule in the inferior lingula which is not seen on prior room and has a fuzzy margin and may be inflammatory.  IMPRESSION:  1.  No evidence of lymphoma progression. 2.  Stable mediastinal hilar lymphadenopathy. 3.  New pulmonary nodule in the inferior lingula is likely infectious or inflammatory. 4.  Stable intrafissural nodule along the right oblique fissure.  CT ABDOMEN AND PELVIS  Findings: Multiple hepatic cysts are unchanged.  Gallbladder, pancreas, spleen, adrenal glands, and kidneys are normal.  Stomach, small bowel, colon are normal.  Abdominal aorta normal caliber.  No retroperitoneal periportal lymphadenopathy.  No free fluid in the pelvis.  Prostate gland and  bladder are normal.  No pelvic lymphadenopathy.  Review of bone windows demonstrates a benign-appearing chondroid lesion within the proximal left femur (image 123).  Lucent lesion within the left femoral head is unchanged (image 103).  This is likely a degenerative cyst.  Likewise a well-circumscribed cystic lesion within the L5 vertebral body (image 84) is also unchanged and likely related to degenerative cystic change.  IMPRESSION:  1.  No evidence of lymphoma within the abdomen or pelvis. 2.  Stable hepatic cysts. 3.  Stable benign appearing bone lesions as described above.  Original Report Authenticated By: Genevive Bi, M.D.   Ct Soft Tissue Neck W Contrast  10/14/2011  *RADIOLOGY REPORT*   Clinical Data:  Restaging non Hodgkin's lymphoma.  Initial diagnosis of September 2012.  Chemotherapy ongoing.  CT HEAD WITH AND WITHOUT CONTRAST CT NECK, CHEST, ABDOMEN, AND PELVIS WITH CONTRAST  Technique:  Multidetector CT imaging of the head was performed following the standard protocol before and during bolus administration of intravenous contrast. Multidetector CT imaging of the neck, chest, abdomen and pelvis was performed following the standard protocol following bolus administration of intravenous contrast.  Contrast: OMNIPAQUE IOHEXOL 300 MG/ML IV SOLN  Comparison:  PET CT scan 08/09/2011  CT HEAD  Findings: There is no enhancing lesions within the brain parenchyma, meningeal surfaces, or ventricles to suggest metastasis.  Orbits are normal.  No aggressive skull lesion.  Within the subcutaneous/cutaneous tissue of the left lateral scalp there is a flattened lesion which is thinned compared to prior measuring 13 x 5 mm compared to 12 x 7 mm.  IMPRESSION: No evidence of brain metastasis.  Flattened subcutaneous lesion in the left lateral scalp is decreased in size.  CT NECK  Findings: There is no cervical lymphadenopathy.  The salivary glands are normal.  The pharyngeal mucosa is symmetric.  Tonsillar pillars are normal.  Orbits are normal.  No aggressive osseous lesions.  IMPRESSION: No evidence of lymphoma recurrence within the neck.  CT CHEST  Findings: No axillary or supraclavicular lymphadenopathy. Mediastinal lymphadenopathy is similar to prior.  For example,  17 mm precarinal lymph node (image 23) is similar to 19 mm on prior. 9 mm prevascular lymph node (image 23) is similar to 10 mm on prior.  22 mm right hilar lymph node similar to slightly decreased from 23 mm on prior.  No new adenopathy.  No pericardial fluid. The esophagus is normal.  Review of the lung parenchyma demonstrates a 5 mm nodule along the right oblique fissure (image 35).  This is unchanged from comparison.  There is a 6 mm  nodule in the inferior lingula which is not seen on prior room and has a fuzzy margin and may be inflammatory.  IMPRESSION:  1.  No evidence of lymphoma progression. 2.  Stable mediastinal hilar lymphadenopathy. 3.  New pulmonary nodule in the inferior lingula is likely infectious or inflammatory. 4.  Stable intrafissural nodule along the right oblique fissure.  CT ABDOMEN AND PELVIS  Findings: Multiple hepatic cysts are unchanged.  Gallbladder, pancreas, spleen, adrenal glands, and kidneys are normal.  Stomach, small bowel, colon are normal.  Abdominal aorta normal caliber.  No retroperitoneal periportal lymphadenopathy.  No free fluid in  the pelvis.  Prostate gland and  bladder are normal.  No pelvic lymphadenopathy.  Review of bone windows demonstrates a benign-appearing chondroid lesion within the proximal left femur (image 123).  Lucent lesion within the left femoral head is unchanged (image 103).  This is likely a degenerative cyst.  Likewise a well-circumscribed cystic lesion within the L5 vertebral body (image 84) is also unchanged and likely related to degenerative cystic change.  IMPRESSION:  1.  No evidence of lymphoma within the abdomen or pelvis. 2.  Stable hepatic cysts. 3.  Stable benign appearing bone lesions as described above.  Original Report Authenticated By: Genevive Bi, M.D.   Ct Chest W Contrast  10/14/2011  *RADIOLOGY REPORT*  Clinical Data:  Restaging non Hodgkin's lymphoma.  Initial diagnosis of September 2012.  Chemotherapy ongoing.  CT HEAD WITH AND WITHOUT CONTRAST CT NECK, CHEST, ABDOMEN, AND PELVIS WITH CONTRAST  Technique:  Multidetector CT imaging of the head was performed following the standard protocol before and during bolus administration of intravenous contrast. Multidetector CT imaging of the neck, chest, abdomen and pelvis was performed following the standard protocol following bolus administration of intravenous contrast.  Contrast: OMNIPAQUE IOHEXOL 300 MG/ML IV  SOLN  Comparison:  PET CT scan 08/09/2011  CT HEAD  Findings: There is no enhancing lesions within the brain parenchyma, meningeal surfaces, or ventricles to suggest metastasis.  Orbits are normal.  No aggressive skull lesion.  Within the subcutaneous/cutaneous tissue of the left lateral scalp there is a flattened lesion which is thinned compared to prior measuring 13 x 5 mm compared to 12 x 7 mm.  IMPRESSION: No evidence of brain metastasis.  Flattened subcutaneous lesion in the left lateral scalp is decreased in size.  CT NECK  Findings: There is no cervical lymphadenopathy.  The salivary glands are normal.  The pharyngeal mucosa is symmetric.  Tonsillar pillars are normal.  Orbits are normal.  No aggressive osseous lesions.  IMPRESSION: No evidence of lymphoma recurrence within the neck.  CT CHEST  Findings: No axillary or supraclavicular lymphadenopathy. Mediastinal lymphadenopathy is similar to prior.  For example,  17 mm precarinal lymph node (image 23) is similar to 19 mm on prior. 9 mm prevascular lymph node (image 23) is similar to 10 mm on prior.  22 mm right hilar lymph node similar to slightly decreased from 23 mm on prior.  No new adenopathy.  No pericardial fluid. The esophagus is normal.  Review of the lung parenchyma demonstrates a 5 mm nodule along the right oblique fissure (image 35).  This is unchanged from comparison.  There is a 6 mm nodule in the inferior lingula which is not seen on prior room and has a fuzzy margin and may be inflammatory.  IMPRESSION:  1.  No evidence of lymphoma progression. 2.  Stable mediastinal hilar lymphadenopathy. 3.  New pulmonary nodule in the inferior lingula is likely infectious or inflammatory. 4.  Stable intrafissural nodule along the right oblique fissure.  CT ABDOMEN AND PELVIS  Findings: Multiple hepatic cysts are unchanged.  Gallbladder, pancreas, spleen, adrenal glands, and kidneys are normal.  Stomach, small bowel, colon are normal.  Abdominal aorta normal  caliber.  No retroperitoneal periportal lymphadenopathy.  No free fluid in the pelvis.  Prostate gland and  bladder are normal.  No pelvic lymphadenopathy.  Review of bone windows demonstrates a benign-appearing chondroid lesion within the proximal left femur (image 123).  Lucent lesion within the left femoral head is unchanged (image 103).  This  is likely a degenerative cyst.  Likewise a well-circumscribed cystic lesion within the L5 vertebral body (image 84) is also unchanged and likely related to degenerative cystic change.  IMPRESSION:  1.  No evidence of lymphoma within the abdomen or pelvis. 2.  Stable hepatic cysts. 3.  Stable benign appearing bone lesions as described above.  Original Report Authenticated By: Genevive Bi, M.D.    ASSESSMENT: This is a very pleasant 50 years old [place] American male with stage II large B-cell non-Hodgkin lymphoma diagnosed in September of 2012 current on systemic chemotherapy with CHOP/Rituxan status post 3 cycles. The patient is doing fine and tolerated his chemotherapy fairly well. He has significant improvement in his disease based on the CT scan. I discussed the scan results with the patient.  PLAN: I recommend for him to proceed with 3 more cycles of the same regimen. The patient come back for followup visit in 3 weeks with the next cycle of his chemotherapy.  All questions were answered. The patient knows to call the clinic with any problems, questions or concerns. We can certainly see the patient much sooner if necessary.

## 2011-10-26 ENCOUNTER — Other Ambulatory Visit: Payer: Self-pay | Admitting: Physician Assistant

## 2011-10-26 ENCOUNTER — Telehealth: Payer: Self-pay | Admitting: *Deleted

## 2011-10-26 ENCOUNTER — Other Ambulatory Visit (HOSPITAL_BASED_OUTPATIENT_CLINIC_OR_DEPARTMENT_OTHER): Payer: BC Managed Care – PPO

## 2011-10-26 DIAGNOSIS — C8589 Other specified types of non-Hodgkin lymphoma, extranodal and solid organ sites: Secondary | ICD-10-CM

## 2011-10-26 DIAGNOSIS — C8590 Non-Hodgkin lymphoma, unspecified, unspecified site: Secondary | ICD-10-CM

## 2011-10-26 LAB — CBC WITH DIFFERENTIAL/PLATELET
BASO%: 1.2 % (ref 0.0–2.0)
Basophils Absolute: 0 10*3/uL (ref 0.0–0.1)
EOS%: 1.2 % (ref 0.0–7.0)
Eosinophils Absolute: 0 10*3/uL (ref 0.0–0.5)
HCT: 27.1 % — ABNORMAL LOW (ref 38.4–49.9)
HGB: 8.8 g/dL — ABNORMAL LOW (ref 13.0–17.1)
LYMPH%: 32.9 % (ref 14.0–49.0)
MCH: 25.3 pg — ABNORMAL LOW (ref 27.2–33.4)
MCHC: 32.5 g/dL (ref 32.0–36.0)
MCV: 77.9 fL — ABNORMAL LOW (ref 79.3–98.0)
MONO#: 0 10*3/uL — ABNORMAL LOW (ref 0.1–0.9)
MONO%: 3.7 % (ref 0.0–14.0)
NEUT#: 0.5 10*3/uL — ABNORMAL LOW (ref 1.5–6.5)
NEUT%: 61 % (ref 39.0–75.0)
Platelets: 57 10*3/uL — ABNORMAL LOW (ref 140–400)
RBC: 3.48 10*6/uL — ABNORMAL LOW (ref 4.20–5.82)
RDW: 16.9 % — ABNORMAL HIGH (ref 11.0–14.6)
WBC: 0.8 10*3/uL — CL (ref 4.0–10.3)
lymph#: 0.3 10*3/uL — ABNORMAL LOW (ref 0.9–3.3)
nRBC: 0 % (ref 0–0)

## 2011-10-26 LAB — COMPREHENSIVE METABOLIC PANEL
ALT: 17 U/L (ref 0–53)
AST: 21 U/L (ref 0–37)
Albumin: 3.9 g/dL (ref 3.5–5.2)
Alkaline Phosphatase: 58 U/L (ref 39–117)
BUN: 21 mg/dL (ref 6–23)
CO2: 31 mEq/L (ref 19–32)
Calcium: 8.8 mg/dL (ref 8.4–10.5)
Chloride: 99 mEq/L (ref 96–112)
Creatinine, Ser: 0.96 mg/dL (ref 0.50–1.35)
Glucose, Bld: 76 mg/dL (ref 70–99)
Potassium: 4.2 mEq/L (ref 3.5–5.3)
Sodium: 139 mEq/L (ref 135–145)
Total Bilirubin: 0.5 mg/dL (ref 0.3–1.2)
Total Protein: 6.2 g/dL (ref 6.0–8.3)

## 2011-10-26 NOTE — Telephone Encounter (Signed)
12/18--ANC 0.5, WBC 0.8.  Per AJ, neutropenic pxs.  Called and spoke with pt, he verbalized understanding.  SLJ

## 2011-11-03 ENCOUNTER — Other Ambulatory Visit: Payer: BC Managed Care – PPO

## 2011-11-04 ENCOUNTER — Telehealth: Payer: Self-pay | Admitting: Internal Medicine

## 2011-11-04 NOTE — Telephone Encounter (Signed)
I returned pts call who said he missed a call from our office. I did not call him but I confirmed his schedule .

## 2011-11-09 ENCOUNTER — Other Ambulatory Visit: Payer: Self-pay | Admitting: Internal Medicine

## 2011-11-10 ENCOUNTER — Ambulatory Visit (HOSPITAL_BASED_OUTPATIENT_CLINIC_OR_DEPARTMENT_OTHER): Payer: BC Managed Care – PPO

## 2011-11-10 ENCOUNTER — Ambulatory Visit (HOSPITAL_BASED_OUTPATIENT_CLINIC_OR_DEPARTMENT_OTHER): Payer: BC Managed Care – PPO | Admitting: Physician Assistant

## 2011-11-10 ENCOUNTER — Telehealth: Payer: Self-pay | Admitting: Internal Medicine

## 2011-11-10 ENCOUNTER — Other Ambulatory Visit: Payer: BC Managed Care – PPO

## 2011-11-10 VITALS — BP 136/93 | HR 65 | Temp 98.7°F | Ht 70.0 in | Wt 187.0 lb

## 2011-11-10 DIAGNOSIS — C8590 Non-Hodgkin lymphoma, unspecified, unspecified site: Secondary | ICD-10-CM

## 2011-11-10 DIAGNOSIS — C8589 Other specified types of non-Hodgkin lymphoma, extranodal and solid organ sites: Secondary | ICD-10-CM

## 2011-11-10 LAB — COMPREHENSIVE METABOLIC PANEL
ALT: 24 U/L (ref 0–53)
AST: 39 U/L — ABNORMAL HIGH (ref 0–37)
Albumin: 4.6 g/dL (ref 3.5–5.2)
Alkaline Phosphatase: 55 U/L (ref 39–117)
BUN: 20 mg/dL (ref 6–23)
CO2: 27 mEq/L (ref 19–32)
Calcium: 9.3 mg/dL (ref 8.4–10.5)
Chloride: 100 mEq/L (ref 96–112)
Creatinine, Ser: 1.03 mg/dL (ref 0.50–1.35)
Glucose, Bld: 88 mg/dL (ref 70–99)
Potassium: 4.3 mEq/L (ref 3.5–5.3)
Sodium: 137 mEq/L (ref 135–145)
Total Bilirubin: 0.5 mg/dL (ref 0.3–1.2)
Total Protein: 6.9 g/dL (ref 6.0–8.3)

## 2011-11-10 LAB — CBC WITH DIFFERENTIAL/PLATELET
BASO%: 0.5 % (ref 0.0–2.0)
Basophils Absolute: 0 10*3/uL (ref 0.0–0.1)
EOS%: 0 % (ref 0.0–7.0)
Eosinophils Absolute: 0 10*3/uL (ref 0.0–0.5)
HCT: 31.4 % — ABNORMAL LOW (ref 38.4–49.9)
HGB: 10.1 g/dL — ABNORMAL LOW (ref 13.0–17.1)
LYMPH%: 5 % — ABNORMAL LOW (ref 14.0–49.0)
MCH: 25.7 pg — ABNORMAL LOW (ref 27.2–33.4)
MCHC: 32.2 g/dL (ref 32.0–36.0)
MCV: 79.9 fL (ref 79.3–98.0)
MONO#: 1.3 10*3/uL — ABNORMAL HIGH (ref 0.1–0.9)
MONO%: 15.4 % — ABNORMAL HIGH (ref 0.0–14.0)
NEUT#: 6.6 10*3/uL — ABNORMAL HIGH (ref 1.5–6.5)
NEUT%: 79.1 % — ABNORMAL HIGH (ref 39.0–75.0)
Platelets: 244 10*3/uL (ref 140–400)
RBC: 3.93 10*6/uL — ABNORMAL LOW (ref 4.20–5.82)
RDW: 19.7 % — ABNORMAL HIGH (ref 11.0–14.6)
WBC: 8.4 10*3/uL (ref 4.0–10.3)
lymph#: 0.4 10*3/uL — ABNORMAL LOW (ref 0.9–3.3)
nRBC: 1 % — ABNORMAL HIGH (ref 0–0)

## 2011-11-10 MED ORDER — DOXORUBICIN HCL CHEMO IV INJECTION 2 MG/ML
50.0000 mg/m2 | Freq: Once | INTRAVENOUS | Status: AC
  Administered 2011-11-10: 102 mg via INTRAVENOUS
  Filled 2011-11-10: qty 51

## 2011-11-10 MED ORDER — SODIUM CHLORIDE 0.9 % IV SOLN: Freq: Once | INTRAVENOUS | Status: DC

## 2011-11-10 MED ORDER — DEXAMETHASONE SODIUM PHOSPHATE 4 MG/ML IJ SOLN
20.0000 mg | Freq: Once | INTRAMUSCULAR | Status: AC
  Administered 2011-11-10: 20 mg via INTRAVENOUS

## 2011-11-10 MED ORDER — SODIUM CHLORIDE 0.9 % IV SOLN: 375.0000 mg/m2 | Freq: Once | INTRAVENOUS | Status: DC

## 2011-11-10 MED ORDER — RITUXIMAB CHEMO INJECTION 10 MG/ML
375.0000 mg/m2 | Freq: Once | INTRAVENOUS | Status: AC
  Administered 2011-11-10: 800 mg via INTRAVENOUS
  Filled 2011-11-10: qty 80

## 2011-11-10 MED ORDER — ONDANSETRON 16 MG/50ML IVPB (CHCC)
16.0000 mg | Freq: Once | INTRAVENOUS | Status: AC
  Administered 2011-11-10: 16 mg via INTRAVENOUS

## 2011-11-10 MED ORDER — VINCRISTINE SULFATE CHEMO INJECTION 1 MG/ML
2.0000 mg | Freq: Once | INTRAVENOUS | Status: AC
  Administered 2011-11-10: 2 mg via INTRAVENOUS
  Filled 2011-11-10: qty 2

## 2011-11-10 MED ORDER — SODIUM CHLORIDE 0.9 % IV SOLN
750.0000 mg/m2 | Freq: Once | INTRAVENOUS | Status: AC
  Administered 2011-11-10: 1520 mg via INTRAVENOUS
  Filled 2011-11-10: qty 76

## 2011-11-10 MED ORDER — ACETAMINOPHEN 325 MG PO TABS
650.0000 mg | ORAL_TABLET | Freq: Once | ORAL | Status: AC
  Administered 2011-11-10: 650 mg via ORAL

## 2011-11-10 MED ORDER — DIPHENHYDRAMINE HCL 25 MG PO CAPS
50.0000 mg | ORAL_CAPSULE | Freq: Once | ORAL | Status: AC
  Administered 2011-11-10: 50 mg via ORAL

## 2011-11-10 NOTE — Telephone Encounter (Signed)
appts made 1/23 rx and adrena,printed for pt   aom

## 2011-11-11 ENCOUNTER — Ambulatory Visit (HOSPITAL_BASED_OUTPATIENT_CLINIC_OR_DEPARTMENT_OTHER): Payer: BC Managed Care – PPO

## 2011-11-11 ENCOUNTER — Encounter: Payer: Self-pay | Admitting: Physician Assistant

## 2011-11-11 VITALS — BP 146/85 | HR 91 | Temp 97.9°F

## 2011-11-11 DIAGNOSIS — C8581 Other specified types of non-Hodgkin lymphoma, lymph nodes of head, face, and neck: Secondary | ICD-10-CM

## 2011-11-11 DIAGNOSIS — Z5189 Encounter for other specified aftercare: Secondary | ICD-10-CM

## 2011-11-11 DIAGNOSIS — C8590 Non-Hodgkin lymphoma, unspecified, unspecified site: Secondary | ICD-10-CM

## 2011-11-11 MED ORDER — PEGFILGRASTIM INJECTION 6 MG/0.6ML
6.0000 mg | Freq: Once | SUBCUTANEOUS | Status: AC
  Administered 2011-11-11: 6 mg via SUBCUTANEOUS
  Filled 2011-11-11: qty 0.6

## 2011-11-11 NOTE — Progress Notes (Signed)
[facility] OFFICE PROGRESS NOTE  Carollee Herter, MD, MD [address] [place] [address]  DIAGNOSIS: Stage II, large B-cell non-Hodgkin's lymphoma diagnosed in September 2012 with a scalp lesion. In addition he had mediastinal lymphadenopathy.   PRIOR THERAPY: None.   CURRENT THERAPY: Systemic chemotherapy with CHOP/Rituxan, given every 3 weeks with Neulasta support, status post 4 cycles.   INTERVAL HISTORY: Leonard Bentley 51 y.o. male returns to the clinic today for followup visit accompanied by his father. Thus far he has tolerated his chemotherapy without difficulty. He has of late with noticed decreased energy and decreased endurance. He is now only able to run approximately 45 minutes although he is still able to run up and down hills. Reports that his appetite remains good. He reports he is less tolerant to the cold.   MEDICAL HISTORY: Past Medical History  Diagnosis Date  . ED (erectile dysfunction)   . Cancer 07/2011    STAGE II NON- HODGKIN LYMPHOMA  . Lymphoma, non Hodgkin's 09/25/2011    ALLERGIES:   has no known allergies.  MEDICATIONS:  Current Outpatient Prescriptions  Medication Sig Dispense Refill  . allopurinol (ZYLOPRIM) 100 MG tablet TAKE ONE TABLET BY MOUTH THREE TIMES DAILY  90 tablet  1  . CIALIS 20 MG tablet TAKE 1 TABLET BY MOUTH EVERY Reasons AS NEEDED  6 tablet  0  . predniSONE (DELTASONE) 50 MG tablet Take 50 mg by mouth 2 (two) times daily. TAKE 100 MG PO DAILY FOR FIRST 5 DAYS OF EACH CHEM. CYCLE.       Marland Kitchen prochlorperazine (COMPAZINE) 10 MG tablet Take 10 mg by mouth every 6 (six) hours as needed.         No current facility-administered medications for this visit.   Facility-Administered Medications Ordered in Other Visits  Medication Dose Route Frequency Provider Last Rate Last Dose  . pegfilgrastim (NEULASTA) injection 6 mg  6 mg Subcutaneous Once Mohamed K. Arbutus Ped, MD   6 mg at 11/11/11 1427    REVIEW OF SYSTEMS:  A  comprehensive review of systems was negative except for: Constitutional: positive for fatigue and Decreased endurance, and decreased energy   PHYSICAL EXAMINATION: General appearance: alert, cooperative and no distress Head: Normocephalic, without obvious abnormality, atraumatic Neck: no adenopathy Lymph nodes: Cervical, supraclavicular, and axillary nodes normal. Resp: clear to auscultation bilaterally Cardio: regular rate and rhythm, S1, S2 normal, no murmur, click, rub or gallop GI: soft, non-tender; bowel sounds normal; no masses,  no organomegaly Extremities: extremities normal, atraumatic, no cyanosis or edema Neurologic: Alert and oriented X 3, normal strength and tone. Normal symmetric reflexes. Normal coordination and gait  ECOG PERFORMANCE STATUS: 0 - Asymptomatic  There were no vitals taken for this visit.  LABORATORY DATA: Lab Results  Component Value Date   WBC 8.4 11/10/2011   HGB 10.1* 11/10/2011   HCT 31.4* 11/10/2011   MCV 79.9 11/10/2011   PLT 244 11/10/2011      Chemistry      Component Value Date/Time   NA 137 11/10/2011 0803   NA 141 10/14/2011 0757   K 4.3 11/10/2011 0803   K 4.4 10/14/2011 0757   CL 100 11/10/2011 0803   CL 99 10/14/2011 0757   CO2 27 11/10/2011 0803   CO2 30 10/14/2011 0757   BUN 20 11/10/2011 0803   BUN 16 10/14/2011 0757   CREATININE 1.03 11/10/2011 0803   CREATININE 1.0 10/14/2011 0757      Component Value Date/Time  CALCIUM 9.3 11/10/2011 0803   CALCIUM 8.7 10/14/2011 0757   ALKPHOS 55 11/10/2011 0803   ALKPHOS 81 10/14/2011 0757   AST 39* 11/10/2011 0803   AST 32 10/14/2011 0757   ALT 24 11/10/2011 0803   BILITOT 0.5 11/10/2011 0803   BILITOT 0.50 10/14/2011 0757       RADIOGRAPHIC STUDIES: Ct Head [name]  10/14/2011  *RADIOLOGY REPORT*  Clinical Data:  Restaging non Hodgkin's lymphoma.  Initial diagnosis of September 2012.  Chemotherapy ongoing.  CT HEAD WITH AND WITHOUT CONTRAST CT NECK, CHEST, ABDOMEN, AND PELVIS WITH CONTRAST  Technique:   Multidetector CT imaging of the head was performed following the standard protocol before and during bolus administration of intravenous contrast. Multidetector CT imaging of the neck, chest, abdomen and pelvis was performed following the standard protocol following bolus administration of intravenous contrast.  Contrast: OMNIPAQUE IOHEXOL 300 MG/ML IV SOLN  Comparison:  PET CT scan 08/09/2011  CT HEAD  Findings: There is no enhancing lesions within the brain parenchyma, meningeal surfaces, or ventricles to suggest metastasis.  Orbits are normal.  No aggressive skull lesion.  Within the subcutaneous/cutaneous tissue of the left lateral scalp there is a flattened lesion which is thinned compared to prior measuring 13 x 5 mm compared to 12 x 7 mm.  IMPRESSION: No evidence of brain metastasis.  Flattened subcutaneous lesion in the left lateral scalp is decreased in size.  CT NECK  Findings: There is no cervical lymphadenopathy.  The salivary glands are normal.  The pharyngeal mucosa is symmetric.  Tonsillar pillars are normal.  Orbits are normal.  No aggressive osseous lesions.  IMPRESSION: No evidence of lymphoma recurrence within the neck.  CT CHEST  Findings: No axillary or supraclavicular lymphadenopathy. Mediastinal lymphadenopathy is similar to prior.  For example,  17 mm precarinal lymph node (image 23) is similar to 19 mm on prior. 9 mm prevascular lymph node (image 23) is similar to 10 mm on prior.  22 mm right hilar lymph node similar to slightly decreased from 23 mm on prior.  No new adenopathy.  No pericardial fluid. The esophagus is normal.  Review of the lung parenchyma demonstrates a 5 mm nodule along the right oblique fissure (image 35).  This is unchanged from comparison.  There is a 6 mm nodule in the inferior lingula which is not seen on prior room and has a fuzzy margin and may be inflammatory.  IMPRESSION:  1.  No evidence of lymphoma progression. 2.  Stable mediastinal hilar lymphadenopathy.  3.  New pulmonary nodule in the inferior lingula is likely infectious or inflammatory. 4.  Stable intrafissural nodule along the right oblique fissure.  CT ABDOMEN AND PELVIS  Findings: Multiple hepatic cysts are unchanged.  Gallbladder, pancreas, spleen, adrenal glands, and kidneys are normal.  Stomach, small bowel, colon are normal.  Abdominal aorta normal caliber.  No retroperitoneal periportal lymphadenopathy.  No free fluid in the pelvis.  Prostate gland and  bladder are normal.  No pelvic lymphadenopathy.  Review of bone windows demonstrates a benign-appearing chondroid lesion within the proximal left femur (image 123).  Lucent lesion within the left femoral head is unchanged (image 103).  This is likely a degenerative cyst.  Likewise a well-circumscribed cystic lesion within the L5 vertebral body (image 84) is also unchanged and likely related to degenerative cystic change.  IMPRESSION:  1.  No evidence of lymphoma within the abdomen or pelvis. 2.  Stable hepatic cysts. 3.  Stable benign  appearing bone lesions as described above.  Original Report Authenticated By: Genevive Bi, M.D.   Ct Soft Tissue Neck W Contrast  10/14/2011  *RADIOLOGY REPORT*  Clinical Data:  Restaging non Hodgkin's lymphoma.  Initial diagnosis of September 2012.  Chemotherapy ongoing.  CT HEAD WITH AND WITHOUT CONTRAST CT NECK, CHEST, ABDOMEN, AND PELVIS WITH CONTRAST  Technique:  Multidetector CT imaging of the head was performed following the standard protocol before and during bolus administration of intravenous contrast. Multidetector CT imaging of the neck, chest, abdomen and pelvis was performed following the standard protocol following bolus administration of intravenous contrast.  Contrast: OMNIPAQUE IOHEXOL 300 MG/ML IV SOLN  Comparison:  PET CT scan 08/09/2011  CT HEAD  Findings: There is no enhancing lesions within the brain parenchyma, meningeal surfaces, or ventricles to suggest metastasis.  Orbits are normal.   No aggressive skull lesion.  Within the subcutaneous/cutaneous tissue of the left lateral scalp there is a flattened lesion which is thinned compared to prior measuring 13 x 5 mm compared to 12 x 7 mm.  IMPRESSION: No evidence of brain metastasis.  Flattened subcutaneous lesion in the left lateral scalp is decreased in size.  CT NECK  Findings: There is no cervical lymphadenopathy.  The salivary glands are normal.  The pharyngeal mucosa is symmetric.  Tonsillar pillars are normal.  Orbits are normal.  No aggressive osseous lesions.  IMPRESSION: No evidence of lymphoma recurrence within the neck.  CT CHEST  Findings: No axillary or supraclavicular lymphadenopathy. Mediastinal lymphadenopathy is similar to prior.  For example,  17 mm precarinal lymph node (image 23) is similar to 19 mm on prior. 9 mm prevascular lymph node (image 23) is similar to 10 mm on prior.  22 mm right hilar lymph node similar to slightly decreased from 23 mm on prior.  No new adenopathy.  No pericardial fluid. The esophagus is normal.  Review of the lung parenchyma demonstrates a 5 mm nodule along the right oblique fissure (image 35).  This is unchanged from comparison.  There is a 6 mm nodule in the inferior lingula which is not seen on prior room and has a fuzzy margin and may be inflammatory.  IMPRESSION:  1.  No evidence of lymphoma progression. 2.  Stable mediastinal hilar lymphadenopathy. 3.  New pulmonary nodule in the inferior lingula is likely infectious or inflammatory. 4.  Stable intrafissural nodule along the right oblique fissure.  CT ABDOMEN AND PELVIS  Findings: Multiple hepatic cysts are unchanged.  Gallbladder, pancreas, spleen, adrenal glands, and kidneys are normal.  Stomach, small bowel, colon are normal.  Abdominal aorta normal caliber.  No retroperitoneal periportal lymphadenopathy.  No free fluid in the pelvis.  Prostate gland and  bladder are normal.  No pelvic lymphadenopathy.  Review of bone windows demonstrates a  benign-appearing chondroid lesion within the proximal left femur (image 123).  Lucent lesion within the left femoral head is unchanged (image 103).  This is likely a degenerative cyst.  Likewise a well-circumscribed cystic lesion within the L5 vertebral body (image 84) is also unchanged and likely related to degenerative cystic change.  IMPRESSION:  1.  No evidence of lymphoma within the abdomen or pelvis. 2.  Stable hepatic cysts. 3.  Stable benign appearing bone lesions as described above.  Original Report Authenticated By: Genevive Bi, M.D.   Ct Chest W Contrast  10/14/2011  *RADIOLOGY REPORT*  Clinical Data:  Restaging non Hodgkin's lymphoma.  Initial diagnosis of September 2012.  Chemotherapy ongoing.  CT HEAD WITH AND WITHOUT CONTRAST CT NECK, CHEST, ABDOMEN, AND PELVIS WITH CONTRAST  Technique:  Multidetector CT imaging of the head was performed following the standard protocol before and during bolus administration of intravenous contrast. Multidetector CT imaging of the neck, chest, abdomen and pelvis was performed following the standard protocol following bolus administration of intravenous contrast.  Contrast: OMNIPAQUE IOHEXOL 300 MG/ML IV SOLN  Comparison:  PET CT scan 08/09/2011  CT HEAD  Findings: There is no enhancing lesions within the brain parenchyma, meningeal surfaces, or ventricles to suggest metastasis.  Orbits are normal.  No aggressive skull lesion.  Within the subcutaneous/cutaneous tissue of the left lateral scalp there is a flattened lesion which is thinned compared to prior measuring 13 x 5 mm compared to 12 x 7 mm.  IMPRESSION: No evidence of brain metastasis.  Flattened subcutaneous lesion in the left lateral scalp is decreased in size.  CT NECK  Findings: There is no cervical lymphadenopathy.  The salivary glands are normal.  The pharyngeal mucosa is symmetric.  Tonsillar pillars are normal.  Orbits are normal.  No aggressive osseous lesions.  IMPRESSION: No evidence of  lymphoma recurrence within the neck.  CT CHEST  Findings: No axillary or supraclavicular lymphadenopathy. Mediastinal lymphadenopathy is similar to prior.  For example,  17 mm precarinal lymph node (image 23) is similar to 19 mm on prior. 9 mm prevascular lymph node (image 23) is similar to 10 mm on prior.  22 mm right hilar lymph node similar to slightly decreased from 23 mm on prior.  No new adenopathy.  No pericardial fluid. The esophagus is normal.  Review of the lung parenchyma demonstrates a 5 mm nodule along the right oblique fissure (image 35).  This is unchanged from comparison.  There is a 6 mm nodule in the inferior lingula which is not seen on prior room and has a fuzzy margin and may be inflammatory.  IMPRESSION:  1.  No evidence of lymphoma progression. 2.  Stable mediastinal hilar lymphadenopathy. 3.  New pulmonary nodule in the inferior lingula is likely infectious or inflammatory. 4.  Stable intrafissural nodule along the right oblique fissure.  CT ABDOMEN AND PELVIS  Findings: Multiple hepatic cysts are unchanged.  Gallbladder, pancreas, spleen, adrenal glands, and kidneys are normal.  Stomach, small bowel, colon are normal.  Abdominal aorta normal caliber.  No retroperitoneal periportal lymphadenopathy.  No free fluid in the pelvis.  Prostate gland and  bladder are normal.  No pelvic lymphadenopathy.  Review of bone windows demonstrates a benign-appearing chondroid lesion within the proximal left femur (image 123).  Lucent lesion within the left femoral head is unchanged (image 103).  This is likely a degenerative cyst.  Likewise a well-circumscribed cystic lesion within the L5 vertebral body (image 84) is also unchanged and likely related to degenerative cystic change.  IMPRESSION:  1.  No evidence of lymphoma within the abdomen or pelvis. 2.  Stable hepatic cysts. 3.  Stable benign appearing bone lesions as described above.  Original Report Authenticated By: Genevive Bi, M.D.     ASSESSMENT/PLAN: This is a very pleasant 51 years old [place] American male with stage II large B-cell non-Hodgkin lymphoma diagnosed in September of 2012 current on systemic chemotherapy with CHOP/Rituxan status post 4 cycles. The patient is doing fine and tolerated his chemotherapy fairly well. He has significant improvement in his disease based on his most recent  CT scan. He'll proceed with cycle #5 of his systemic chemotherapy with CHOP/Rituxan  as scheduled. He'll continue with weekly labs a scheduled return in 3 weeks prior to cycle #6 of his chemotherapy as scheduled he will also have a symptom management visit when he returns in 3 weeks. He mentioned wanting to start tumeric as a supplement. We recommended that he hold off initiating starting the supplement until he has completed all of his chemotherapy. He voices understanding and was in agreement with waiting.  Conni Slipper, PA-C   All questions were answered. The patient knows to call the clinic with any problems, questions or concerns. We can certainly see the patient much sooner if necessary.

## 2011-11-12 ENCOUNTER — Encounter: Payer: Self-pay | Admitting: Family Medicine

## 2011-11-12 ENCOUNTER — Ambulatory Visit (INDEPENDENT_AMBULATORY_CARE_PROVIDER_SITE_OTHER): Payer: BC Managed Care – PPO | Admitting: Family Medicine

## 2011-11-12 VITALS — BP 142/88 | Temp 97.7°F | Ht 69.0 in | Wt 191.0 lb

## 2011-11-12 DIAGNOSIS — H6120 Impacted cerumen, unspecified ear: Secondary | ICD-10-CM

## 2011-11-12 DIAGNOSIS — C8589 Other specified types of non-Hodgkin lymphoma, extranodal and solid organ sites: Secondary | ICD-10-CM

## 2011-11-12 DIAGNOSIS — C8590 Non-Hodgkin lymphoma, unspecified, unspecified site: Secondary | ICD-10-CM

## 2011-11-12 NOTE — Patient Instructions (Signed)
call if further problems

## 2011-11-12 NOTE — Progress Notes (Signed)
  Subjective:    Patient ID: Leonard Bentley, male    DOB: Apr 18, 1961, 52 y.o.   MRN: [id number]  HPI He is here for consult concerning difficulty hearing from the right ear. He has a previous history of cerumen impaction and thinks it is causing trouble. He has had no fever, chills or earache. He is also being followed and treated at the present time for non-Hodgkin's lymphoma. Presently he is on steroids which he has noted interfere with sleep and have made him gain weight   Review of Systems     Objective:   Physical Exam Alert and in no distress. Cerumen is noted in the right canal and was lavaged. The canal and TM appear normal. Left canal and TM are normal.      Assessment & Plan:   1. Cerumen impaction   2. Non Hodgkin's lymphoma    the year was lavaged without difficulty. Recommend Afrin nasal spray he also notes some continued difficulty with fullness and decreased hearing. If this does not resolve, he is to return to

## 2011-11-17 ENCOUNTER — Telehealth: Payer: Self-pay | Admitting: Internal Medicine

## 2011-11-17 ENCOUNTER — Other Ambulatory Visit (HOSPITAL_BASED_OUTPATIENT_CLINIC_OR_DEPARTMENT_OTHER): Payer: BC Managed Care – PPO

## 2011-11-17 DIAGNOSIS — C8590 Non-Hodgkin lymphoma, unspecified, unspecified site: Secondary | ICD-10-CM

## 2011-11-17 DIAGNOSIS — C8581 Other specified types of non-Hodgkin lymphoma, lymph nodes of head, face, and neck: Secondary | ICD-10-CM

## 2011-11-17 LAB — CBC WITH DIFFERENTIAL/PLATELET
BASO%: 0 % (ref 0.0–2.0)
Basophils Absolute: 0 10*3/uL (ref 0.0–0.1)
EOS%: 0.8 % (ref 0.0–7.0)
Eosinophils Absolute: 0 10*3/uL (ref 0.0–0.5)
HCT: 26.2 % — ABNORMAL LOW (ref 38.4–49.9)
HGB: 8.6 g/dL — ABNORMAL LOW (ref 13.0–17.1)
LYMPH%: 49.2 % — ABNORMAL HIGH (ref 14.0–49.0)
MCH: 27.2 pg (ref 27.2–33.4)
MCHC: 32.8 g/dL (ref 32.0–36.0)
MCV: 82.9 fL (ref 79.3–98.0)
MONO#: 0.1 10*3/uL (ref 0.1–0.9)
MONO%: 7.1 % (ref 0.0–14.0)
NEUT#: 0.3 10*3/uL — CL (ref 1.5–6.5)
NEUT%: 42.9 % (ref 39.0–75.0)
Platelets: 70 10*3/uL — ABNORMAL LOW (ref 140–400)
RBC: 3.16 10*6/uL — ABNORMAL LOW (ref 4.20–5.82)
RDW: 19.7 % — ABNORMAL HIGH (ref 11.0–14.6)
WBC: 0.7 10*3/uL — CL (ref 4.0–10.3)
lymph#: 0.4 10*3/uL — ABNORMAL LOW (ref 0.9–3.3)

## 2011-11-17 LAB — COMPREHENSIVE METABOLIC PANEL
ALT: 16 U/L (ref 0–53)
AST: 21 U/L (ref 0–37)
Albumin: 4.1 g/dL (ref 3.5–5.2)
Alkaline Phosphatase: 61 U/L (ref 39–117)
BUN: 24 mg/dL — ABNORMAL HIGH (ref 6–23)
CO2: 28 mEq/L (ref 19–32)
Calcium: 9.4 mg/dL (ref 8.4–10.5)
Chloride: 98 mEq/L (ref 96–112)
Creatinine, Ser: 1.05 mg/dL (ref 0.50–1.35)
Glucose, Bld: 91 mg/dL (ref 70–99)
Potassium: 4.4 mEq/L (ref 3.5–5.3)
Sodium: 136 mEq/L (ref 135–145)
Total Bilirubin: 0.8 mg/dL (ref 0.3–1.2)
Total Protein: 6.3 g/dL (ref 6.0–8.3)

## 2011-11-17 NOTE — Telephone Encounter (Signed)
Notified patient of Neutrapenic  precautions- patient  understanding. Pt understands when to call provider

## 2011-11-24 ENCOUNTER — Other Ambulatory Visit (HOSPITAL_BASED_OUTPATIENT_CLINIC_OR_DEPARTMENT_OTHER): Payer: BC Managed Care – PPO

## 2011-11-24 DIAGNOSIS — C8590 Non-Hodgkin lymphoma, unspecified, unspecified site: Secondary | ICD-10-CM

## 2011-11-24 DIAGNOSIS — C8581 Other specified types of non-Hodgkin lymphoma, lymph nodes of head, face, and neck: Secondary | ICD-10-CM

## 2011-11-24 LAB — CBC WITH DIFFERENTIAL/PLATELET
BASO%: 1 % (ref 0.0–2.0)
Basophils Absolute: 0.1 10*3/uL (ref 0.0–0.1)
EOS%: 0 % (ref 0.0–7.0)
Eosinophils Absolute: 0 10*3/uL (ref 0.0–0.5)
HCT: 29.9 % — ABNORMAL LOW (ref 38.4–49.9)
HGB: 9.4 g/dL — ABNORMAL LOW (ref 13.0–17.1)
LYMPH%: 4.3 % — ABNORMAL LOW (ref 14.0–49.0)
MCH: 25.8 pg — ABNORMAL LOW (ref 27.2–33.4)
MCHC: 31.4 g/dL — ABNORMAL LOW (ref 32.0–36.0)
MCV: 82.1 fL (ref 79.3–98.0)
MONO#: 1.2 10*3/uL — ABNORMAL HIGH (ref 0.1–0.9)
MONO%: 12 % (ref 0.0–14.0)
NEUT#: 8.4 10*3/uL — ABNORMAL HIGH (ref 1.5–6.5)
NEUT%: 82.7 % — ABNORMAL HIGH (ref 39.0–75.0)
Platelets: 185 10*3/uL (ref 140–400)
RBC: 3.64 10*6/uL — ABNORMAL LOW (ref 4.20–5.82)
RDW: 20.1 % — ABNORMAL HIGH (ref 11.0–14.6)
WBC: 10.2 10*3/uL (ref 4.0–10.3)
lymph#: 0.4 10*3/uL — ABNORMAL LOW (ref 0.9–3.3)
nRBC: 15 % — ABNORMAL HIGH (ref 0–0)

## 2011-11-24 LAB — COMPREHENSIVE METABOLIC PANEL
ALT: 18 U/L (ref 0–53)
AST: 29 U/L (ref 0–37)
Albumin: 4.3 g/dL (ref 3.5–5.2)
Alkaline Phosphatase: 70 U/L (ref 39–117)
BUN: 22 mg/dL (ref 6–23)
CO2: 28 mEq/L (ref 19–32)
Calcium: 8.7 mg/dL (ref 8.4–10.5)
Chloride: 104 mEq/L (ref 96–112)
Creatinine, Ser: 1.08 mg/dL (ref 0.50–1.35)
Glucose, Bld: 87 mg/dL (ref 70–99)
Potassium: 4.4 mEq/L (ref 3.5–5.3)
Sodium: 140 mEq/L (ref 135–145)
Total Bilirubin: 0.3 mg/dL (ref 0.3–1.2)
Total Protein: 6.5 g/dL (ref 6.0–8.3)

## 2011-12-01 ENCOUNTER — Other Ambulatory Visit (HOSPITAL_BASED_OUTPATIENT_CLINIC_OR_DEPARTMENT_OTHER): Payer: BC Managed Care – PPO

## 2011-12-01 ENCOUNTER — Ambulatory Visit (HOSPITAL_BASED_OUTPATIENT_CLINIC_OR_DEPARTMENT_OTHER): Payer: BC Managed Care – PPO | Admitting: Physician Assistant

## 2011-12-01 ENCOUNTER — Encounter: Payer: Self-pay | Admitting: Physician Assistant

## 2011-12-01 ENCOUNTER — Ambulatory Visit (HOSPITAL_BASED_OUTPATIENT_CLINIC_OR_DEPARTMENT_OTHER): Payer: BC Managed Care – PPO

## 2011-12-01 VITALS — BP 130/82 | HR 74 | Temp 97.7°F

## 2011-12-01 VITALS — BP 130/83 | HR 70 | Temp 97.0°F | Ht 69.0 in | Wt 187.7 lb

## 2011-12-01 DIAGNOSIS — C8589 Other specified types of non-Hodgkin lymphoma, extranodal and solid organ sites: Secondary | ICD-10-CM

## 2011-12-01 DIAGNOSIS — C8590 Non-Hodgkin lymphoma, unspecified, unspecified site: Secondary | ICD-10-CM

## 2011-12-01 DIAGNOSIS — Z5111 Encounter for antineoplastic chemotherapy: Secondary | ICD-10-CM

## 2011-12-01 DIAGNOSIS — Z09 Encounter for follow-up examination after completed treatment for conditions other than malignant neoplasm: Secondary | ICD-10-CM

## 2011-12-01 LAB — CBC WITH DIFFERENTIAL/PLATELET
BASO%: 0.6 % (ref 0.0–2.0)
Basophils Absolute: 0 10*3/uL (ref 0.0–0.1)
EOS%: 0 % (ref 0.0–7.0)
Eosinophils Absolute: 0 10*3/uL (ref 0.0–0.5)
HCT: 29.4 % — ABNORMAL LOW (ref 38.4–49.9)
HGB: 9.5 g/dL — ABNORMAL LOW (ref 13.0–17.1)
LYMPH%: 6.8 % — ABNORMAL LOW (ref 14.0–49.0)
MCH: 26.2 pg — ABNORMAL LOW (ref 27.2–33.4)
MCHC: 32.3 g/dL (ref 32.0–36.0)
MCV: 81.2 fL (ref 79.3–98.0)
MONO#: 0.7 10*3/uL (ref 0.1–0.9)
MONO%: 13.6 % (ref 0.0–14.0)
NEUT#: 4.2 10*3/uL (ref 1.5–6.5)
NEUT%: 79 % — ABNORMAL HIGH (ref 39.0–75.0)
Platelets: 251 10*3/uL (ref 140–400)
RBC: 3.62 10*6/uL — ABNORMAL LOW (ref 4.20–5.82)
RDW: 19.8 % — ABNORMAL HIGH (ref 11.0–14.6)
WBC: 5.3 10*3/uL (ref 4.0–10.3)
lymph#: 0.4 10*3/uL — ABNORMAL LOW (ref 0.9–3.3)
nRBC: 3 % — ABNORMAL HIGH (ref 0–0)

## 2011-12-01 LAB — COMPREHENSIVE METABOLIC PANEL
ALT: 16 U/L (ref 0–53)
AST: 28 U/L (ref 0–37)
Albumin: 4.5 g/dL (ref 3.5–5.2)
Alkaline Phosphatase: 55 U/L (ref 39–117)
BUN: 14 mg/dL (ref 6–23)
CO2: 25 mEq/L (ref 19–32)
Calcium: 8.8 mg/dL (ref 8.4–10.5)
Chloride: 104 mEq/L (ref 96–112)
Creatinine, Ser: 0.95 mg/dL (ref 0.50–1.35)
Glucose, Bld: 130 mg/dL — ABNORMAL HIGH (ref 70–99)
Potassium: 4.6 mEq/L (ref 3.5–5.3)
Sodium: 140 mEq/L (ref 135–145)
Total Bilirubin: 0.5 mg/dL (ref 0.3–1.2)
Total Protein: 6.7 g/dL (ref 6.0–8.3)

## 2011-12-01 LAB — URIC ACID: Uric Acid, Serum: 4.1 mg/dL (ref 4.0–7.8)

## 2011-12-01 LAB — LACTATE DEHYDROGENASE: LDH: 282 U/L — ABNORMAL HIGH (ref 94–250)

## 2011-12-01 MED ORDER — SODIUM CHLORIDE 0.9 % IV SOLN: 375.0000 mg/m2 | Freq: Once | INTRAVENOUS | Status: DC

## 2011-12-01 MED ORDER — PREDNISONE 50 MG PO TABS: 50.0000 mg | ORAL_TABLET | Freq: Two times a day (BID) | ORAL | Status: DC

## 2011-12-01 MED ORDER — ONDANSETRON 16 MG/50ML IVPB (CHCC)
16.0000 mg | Freq: Once | INTRAVENOUS | Status: AC
  Administered 2011-12-01: 16 mg via INTRAVENOUS

## 2011-12-01 MED ORDER — ACETAMINOPHEN 325 MG PO TABS
650.0000 mg | ORAL_TABLET | Freq: Once | ORAL | Status: AC
  Administered 2011-12-01: 650 mg via ORAL

## 2011-12-01 MED ORDER — DIPHENHYDRAMINE HCL 25 MG PO CAPS
50.0000 mg | ORAL_CAPSULE | Freq: Once | ORAL | Status: AC
  Administered 2011-12-01: 50 mg via ORAL

## 2011-12-01 MED ORDER — LORAZEPAM 0.5 MG PO TABS: ORAL_TABLET | ORAL | Status: DC

## 2011-12-01 MED ORDER — VINCRISTINE SULFATE CHEMO INJECTION 1 MG/ML
2.0000 mg | Freq: Once | INTRAVENOUS | Status: AC
  Administered 2011-12-01: 2 mg via INTRAVENOUS
  Filled 2011-12-01: qty 2

## 2011-12-01 MED ORDER — SODIUM CHLORIDE 0.9 % IV SOLN
750.0000 mg/m2 | Freq: Once | INTRAVENOUS | Status: AC
  Administered 2011-12-01: 1520 mg via INTRAVENOUS
  Filled 2011-12-01: qty 76

## 2011-12-01 MED ORDER — DEXAMETHASONE SODIUM PHOSPHATE 4 MG/ML IJ SOLN
20.0000 mg | Freq: Once | INTRAMUSCULAR | Status: AC
  Administered 2011-12-01: 20 mg via INTRAVENOUS

## 2011-12-01 MED ORDER — DOXORUBICIN HCL CHEMO IV INJECTION 2 MG/ML
50.0000 mg/m2 | Freq: Once | INTRAVENOUS | Status: AC
  Administered 2011-12-01: 102 mg via INTRAVENOUS
  Filled 2011-12-01: qty 51

## 2011-12-01 MED ORDER — SODIUM CHLORIDE 0.9 % IV SOLN
800.0000 mg | Freq: Once | INTRAVENOUS | Status: AC
  Administered 2011-12-01: 800 mg via INTRAVENOUS
  Filled 2011-12-01: qty 80

## 2011-12-01 MED ORDER — SODIUM CHLORIDE 0.9 % IV SOLN
Freq: Once | INTRAVENOUS | Status: AC
  Administered 2011-12-01: 11:00:00 via INTRAVENOUS

## 2011-12-01 NOTE — Progress Notes (Signed)
[facility] OFFICE PROGRESS NOTE  Carollee Herter, MD, MD [address] [place] [address]  DIAGNOSIS: Stage II, large B-cell non-Hodgkin's lymphoma diagnosed in September 2012 with a scalp lesion. In addition he had mediastinal lymphadenopathy.   PRIOR THERAPY: None.   CURRENT THERAPY: Systemic chemotherapy with CHOP/Rituxan, given every 3 weeks with Neulasta support, status post 5 cycles.   INTERVAL HISTORY: Leonard Bentley 51 y.o. male returns to the clinic today for followup visit.  Thus far he has tolerated his chemotherapy without difficulty. He continues to notice decreased endurance particularly with running. He is able to lift the same amount of weight and do the same number pushups however. His appetite remains good. He presents to proceed with his sixth cycle of CHOP Rituxan with Neulasta support. He has noticed some skin discoloration on his hands with some minor numbness/tingling in the fourth and fifth fingertip on the right hand.   MEDICAL HISTORY: Past Medical History  Diagnosis Date  . ED (erectile dysfunction)   . Cancer 07/2011    STAGE II NON- HODGKIN LYMPHOMA  . Lymphoma, non Hodgkin's 09/25/2011    ALLERGIES:   has no known allergies.  MEDICATIONS:  Current Outpatient Prescriptions  Medication Sig Dispense Refill  . allopurinol (ZYLOPRIM) 100 MG tablet TAKE ONE TABLET BY MOUTH THREE TIMES DAILY  90 tablet  1  . CIALIS 20 MG tablet TAKE 1 TABLET BY MOUTH EVERY Norem AS NEEDED  6 tablet  0  . LORazepam (ATIVAN) 0.5 MG tablet Take 1 tablet by mouth 2 hours before PET scan, may repeat 1 hour before scan if needed  2 tablet  0  . predniSONE (DELTASONE) 50 MG tablet Take 1 tablet (50 mg total) by mouth 2 (two) times daily. TAKE 100 MG PO DAILY FOR FIRST 5 DAYS OF EACH CHEM. CYCLE.  8 tablet  0  . prochlorperazine (COMPAZINE) 10 MG tablet Take 10 mg by mouth every 6 (six) hours as needed.         No current facility-administered medications  for this visit.   Facility-Administered Medications Ordered in Other Visits  Medication Dose Route Frequency Provider Last Rate Last Dose  . 0.9 %  sodium chloride infusion   Intravenous Once Conni Slipper, PA 20 mL/hr at 12/01/11 1030    . acetaminophen (TYLENOL) tablet 650 mg  650 mg Oral Once [employer], PA   650 mg at 12/01/11 1037  . cyclophosphamide (CYTOXAN) 1,520 mg in sodium chloride 0.9 % 250 mL chemo infusion  750 mg/m2 (Treatment Plan Actual) Intravenous Once Conni Slipper, PA      . dexamethasone (DECADRON) injection 20 mg  20 mg Intravenous Once Conni Slipper, PA   20 mg at 12/01/11 1039  . diphenhydrAMINE (BENADRYL) capsule 50 mg  50 mg Oral Once Conni Slipper, PA   50 mg at 12/01/11 1037  . DOXOrubicin (ADRIAMYCIN) chemo injection 102 mg  50 mg/m2 (Treatment Plan Actual) Intravenous Once Conni Slipper, PA   102 mg at 12/01/11 1119  . ondansetron (ZOFRAN) IVPB 16 mg  16 mg Intravenous Once Conni Slipper, PA   16 mg at 12/01/11 1039  . riTUXimab (RITUXAN) 800 mg in sodium chloride 0.9 % 170 mL chemo infusion  800 mg Intravenous Once Mohamed K. Mohamed, MD      . vinCRIStine (ONCOVIN) 2 mg in sodium chloride 0.9 % 18 mL chemo injection  2 mg Intravenous Once Conni Slipper, PA      .  DISCONTD: riTUXimab (RITUXAN) 800 mg in sodium chloride 0.9 % 250 mL chemo infusion  375 mg/m2 (Treatment Plan Actual) Intravenous Once Conni Slipper, PA        REVIEW OF SYSTEMS:  A comprehensive review of systems was negative except for: Constitutional: positive for fatigue and Decreased endurance, and decreased energy Neurological: positive for paresthesia Skin discoloration affecting his hands specifically hyperpigmentation   PHYSICAL EXAMINATION: General appearance: alert, cooperative and no distress Head: Normocephalic, without obvious abnormality, atraumatic Neck: no adenopathy Lymph nodes: Cervical, supraclavicular, and axillary nodes normal. Resp: clear to  auscultation bilaterally Cardio: regular rate and rhythm, S1, S2 normal, no murmur, click, rub or gallop GI: soft, non-tender; bowel sounds normal; no masses,  no organomegaly Extremities: extremities normal, atraumatic, no cyanosis or edema Neurologic: Alert and oriented X 3, normal strength and tone. Normal symmetric reflexes. Normal coordination and gait Skin: hyperpigmentation of the palmar and extensor surfaces of the hand  ECOG PERFORMANCE STATUS: 0 - Asymptomatic  Blood pressure 130/83, pulse 70, temperature 97 F (36.1 C), temperature source Oral, height 5\' 9"  (1.753 m), weight 187 lb 11.2 oz (85.14 kg).  LABORATORY DATA: Lab Results  Component Value Date   WBC 5.3 12/01/2011   HGB 9.5* 12/01/2011   HCT 29.4* 12/01/2011   MCV 81.2 12/01/2011   PLT 251 12/01/2011      Chemistry      Component Value Date/Time   NA 140 11/24/2011 0805   NA 141 10/14/2011 0757   K 4.4 11/24/2011 0805   K 4.4 10/14/2011 0757   CL 104 11/24/2011 0805   CL 99 10/14/2011 0757   CO2 28 11/24/2011 0805   CO2 30 10/14/2011 0757   BUN 22 11/24/2011 0805   BUN 16 10/14/2011 0757   CREATININE 1.08 11/24/2011 0805   CREATININE 1.0 10/14/2011 0757      Component Value Date/Time   CALCIUM 8.7 11/24/2011 0805   CALCIUM 8.7 10/14/2011 0757   ALKPHOS 70 11/24/2011 0805   ALKPHOS 81 10/14/2011 0757   AST 29 11/24/2011 0805   AST 32 10/14/2011 0757   ALT 18 11/24/2011 0805   BILITOT 0.3 11/24/2011 0805   BILITOT 0.50 10/14/2011 0757       RADIOGRAPHIC STUDIES: Ct Head [name]  10/14/2011  *RADIOLOGY REPORT*  Clinical Data:  Restaging non Hodgkin's lymphoma.  Initial diagnosis of September 2012.  Chemotherapy ongoing.  CT HEAD WITH AND WITHOUT CONTRAST CT NECK, CHEST, ABDOMEN, AND PELVIS WITH CONTRAST  Technique:  Multidetector CT imaging of the head was performed following the standard protocol before and during bolus administration of intravenous contrast. Multidetector CT imaging of the neck, chest, abdomen and  pelvis was performed following the standard protocol following bolus administration of intravenous contrast.  Contrast: OMNIPAQUE IOHEXOL 300 MG/ML IV SOLN  Comparison:  PET CT scan 08/09/2011  CT HEAD  Findings: There is no enhancing lesions within the brain parenchyma, meningeal surfaces, or ventricles to suggest metastasis.  Orbits are normal.  No aggressive skull lesion.  Within the subcutaneous/cutaneous tissue of the left lateral scalp there is a flattened lesion which is thinned compared to prior measuring 13 x 5 mm compared to 12 x 7 mm.  IMPRESSION: No evidence of brain metastasis.  Flattened subcutaneous lesion in the left lateral scalp is decreased in size.  CT NECK  Findings: There is no cervical lymphadenopathy.  The salivary glands are normal.  The pharyngeal mucosa is symmetric.  Tonsillar pillars are normal.  Orbits  are normal.  No aggressive osseous lesions.  IMPRESSION: No evidence of lymphoma recurrence within the neck.  CT CHEST  Findings: No axillary or supraclavicular lymphadenopathy. Mediastinal lymphadenopathy is similar to prior.  For example,  17 mm precarinal lymph node (image 23) is similar to 19 mm on prior. 9 mm prevascular lymph node (image 23) is similar to 10 mm on prior.  22 mm right hilar lymph node similar to slightly decreased from 23 mm on prior.  No new adenopathy.  No pericardial fluid. The esophagus is normal.  Review of the lung parenchyma demonstrates a 5 mm nodule along the right oblique fissure (image 35).  This is unchanged from comparison.  There is a 6 mm nodule in the inferior lingula which is not seen on prior room and has a fuzzy margin and may be inflammatory.  IMPRESSION:  1.  No evidence of lymphoma progression. 2.  Stable mediastinal hilar lymphadenopathy. 3.  New pulmonary nodule in the inferior lingula is likely infectious or inflammatory. 4.  Stable intrafissural nodule along the right oblique fissure.  CT ABDOMEN AND PELVIS  Findings: Multiple hepatic  cysts are unchanged.  Gallbladder, pancreas, spleen, adrenal glands, and kidneys are normal.  Stomach, small bowel, colon are normal.  Abdominal aorta normal caliber.  No retroperitoneal periportal lymphadenopathy.  No free fluid in the pelvis.  Prostate gland and  bladder are normal.  No pelvic lymphadenopathy.  Review of bone windows demonstrates a benign-appearing chondroid lesion within the proximal left femur (image 123).  Lucent lesion within the left femoral head is unchanged (image 103).  This is likely a degenerative cyst.  Likewise a well-circumscribed cystic lesion within the L5 vertebral body (image 84) is also unchanged and likely related to degenerative cystic change.  IMPRESSION:  1.  No evidence of lymphoma within the abdomen or pelvis. 2.  Stable hepatic cysts. 3.  Stable benign appearing bone lesions as described above.  Original Report Authenticated By: Genevive Bi, M.D.   Ct Soft Tissue Neck W Contrast  10/14/2011  *RADIOLOGY REPORT*  Clinical Data:  Restaging non Hodgkin's lymphoma.  Initial diagnosis of September 2012.  Chemotherapy ongoing.  CT HEAD WITH AND WITHOUT CONTRAST CT NECK, CHEST, ABDOMEN, AND PELVIS WITH CONTRAST  Technique:  Multidetector CT imaging of the head was performed following the standard protocol before and during bolus administration of intravenous contrast. Multidetector CT imaging of the neck, chest, abdomen and pelvis was performed following the standard protocol following bolus administration of intravenous contrast.  Contrast: OMNIPAQUE IOHEXOL 300 MG/ML IV SOLN  Comparison:  PET CT scan 08/09/2011  CT HEAD  Findings: There is no enhancing lesions within the brain parenchyma, meningeal surfaces, or ventricles to suggest metastasis.  Orbits are normal.  No aggressive skull lesion.  Within the subcutaneous/cutaneous tissue of the left lateral scalp there is a flattened lesion which is thinned compared to prior measuring 13 x 5 mm compared to 12 x 7 mm.   IMPRESSION: No evidence of brain metastasis.  Flattened subcutaneous lesion in the left lateral scalp is decreased in size.  CT NECK  Findings: There is no cervical lymphadenopathy.  The salivary glands are normal.  The pharyngeal mucosa is symmetric.  Tonsillar pillars are normal.  Orbits are normal.  No aggressive osseous lesions.  IMPRESSION: No evidence of lymphoma recurrence within the neck.  CT CHEST  Findings: No axillary or supraclavicular lymphadenopathy. Mediastinal lymphadenopathy is similar to prior.  For example,  17 mm precarinal lymph node (image  23) is similar to 19 mm on prior. 9 mm prevascular lymph node (image 23) is similar to 10 mm on prior.  22 mm right hilar lymph node similar to slightly decreased from 23 mm on prior.  No new adenopathy.  No pericardial fluid. The esophagus is normal.  Review of the lung parenchyma demonstrates a 5 mm nodule along the right oblique fissure (image 35).  This is unchanged from comparison.  There is a 6 mm nodule in the inferior lingula which is not seen on prior room and has a fuzzy margin and may be inflammatory.  IMPRESSION:  1.  No evidence of lymphoma progression. 2.  Stable mediastinal hilar lymphadenopathy. 3.  New pulmonary nodule in the inferior lingula is likely infectious or inflammatory. 4.  Stable intrafissural nodule along the right oblique fissure.  CT ABDOMEN AND PELVIS  Findings: Multiple hepatic cysts are unchanged.  Gallbladder, pancreas, spleen, adrenal glands, and kidneys are normal.  Stomach, small bowel, colon are normal.  Abdominal aorta normal caliber.  No retroperitoneal periportal lymphadenopathy.  No free fluid in the pelvis.  Prostate gland and  bladder are normal.  No pelvic lymphadenopathy.  Review of bone windows demonstrates a benign-appearing chondroid lesion within the proximal left femur (image 123).  Lucent lesion within the left femoral head is unchanged (image 103).  This is likely a degenerative cyst.  Likewise a  well-circumscribed cystic lesion within the L5 vertebral body (image 84) is also unchanged and likely related to degenerative cystic change.  IMPRESSION:  1.  No evidence of lymphoma within the abdomen or pelvis. 2.  Stable hepatic cysts. 3.  Stable benign appearing bone lesions as described above.  Original Report Authenticated By: Genevive Bi, M.D.   Ct Chest W Contrast  10/14/2011  *RADIOLOGY REPORT*  Clinical Data:  Restaging non Hodgkin's lymphoma.  Initial diagnosis of September 2012.  Chemotherapy ongoing.  CT HEAD WITH AND WITHOUT CONTRAST CT NECK, CHEST, ABDOMEN, AND PELVIS WITH CONTRAST  Technique:  Multidetector CT imaging of the head was performed following the standard protocol before and during bolus administration of intravenous contrast. Multidetector CT imaging of the neck, chest, abdomen and pelvis was performed following the standard protocol following bolus administration of intravenous contrast.  Contrast: OMNIPAQUE IOHEXOL 300 MG/ML IV SOLN  Comparison:  PET CT scan 08/09/2011  CT HEAD  Findings: There is no enhancing lesions within the brain parenchyma, meningeal surfaces, or ventricles to suggest metastasis.  Orbits are normal.  No aggressive skull lesion.  Within the subcutaneous/cutaneous tissue of the left lateral scalp there is a flattened lesion which is thinned compared to prior measuring 13 x 5 mm compared to 12 x 7 mm.  IMPRESSION: No evidence of brain metastasis.  Flattened subcutaneous lesion in the left lateral scalp is decreased in size.  CT NECK  Findings: There is no cervical lymphadenopathy.  The salivary glands are normal.  The pharyngeal mucosa is symmetric.  Tonsillar pillars are normal.  Orbits are normal.  No aggressive osseous lesions.  IMPRESSION: No evidence of lymphoma recurrence within the neck.  CT CHEST  Findings: No axillary or supraclavicular lymphadenopathy. Mediastinal lymphadenopathy is similar to prior.  For example,  17 mm precarinal lymph node  (image 23) is similar to 19 mm on prior. 9 mm prevascular lymph node (image 23) is similar to 10 mm on prior.  22 mm right hilar lymph node similar to slightly decreased from 23 mm on prior.  No new adenopathy.  No pericardial  fluid. The esophagus is normal.  Review of the lung parenchyma demonstrates a 5 mm nodule along the right oblique fissure (image 35).  This is unchanged from comparison.  There is a 6 mm nodule in the inferior lingula which is not seen on prior room and has a fuzzy margin and may be inflammatory.  IMPRESSION:  1.  No evidence of lymphoma progression. 2.  Stable mediastinal hilar lymphadenopathy. 3.  New pulmonary nodule in the inferior lingula is likely infectious or inflammatory. 4.  Stable intrafissural nodule along the right oblique fissure.  CT ABDOMEN AND PELVIS  Findings: Multiple hepatic cysts are unchanged.  Gallbladder, pancreas, spleen, adrenal glands, and kidneys are normal.  Stomach, small bowel, colon are normal.  Abdominal aorta normal caliber.  No retroperitoneal periportal lymphadenopathy.  No free fluid in the pelvis.  Prostate gland and  bladder are normal.  No pelvic lymphadenopathy.  Review of bone windows demonstrates a benign-appearing chondroid lesion within the proximal left femur (image 123).  Lucent lesion within the left femoral head is unchanged (image 103).  This is likely a degenerative cyst.  Likewise a well-circumscribed cystic lesion within the L5 vertebral body (image 84) is also unchanged and likely related to degenerative cystic change.  IMPRESSION:  1.  No evidence of lymphoma within the abdomen or pelvis. 2.  Stable hepatic cysts. 3.  Stable benign appearing bone lesions as described above.  Original Report Authenticated By: Genevive Bi, M.D.    ASSESSMENT/PLAN: This is a very pleasant 51 years old [place] American male with stage II large B-cell non-Hodgkin lymphoma diagnosed in September of 2012 current on systemic chemotherapy with CHOP/Rituxan  status post 4 cycles. The patient is doing fine and tolerated his chemotherapy fairly well. The patient was discussed with Dr. Arbutus Ped. He'll proceed with cycle #6 of his systemic chemotherapy with CHOP/Rituxan as scheduled. He'll continue with weekly labs a scheduled return in 3 weeks with a repeat CBC differential C. met and LDH as well as a CT of the head with and without contrast and a restaging PET scan to reevaluate his disease. A prescription for an additional 8 tablets of prednisone to complete this current cycle was sent to his pharmacy of record via [facility] and he was given a prescription for 2 tablets of 0.5 mg Ativan to be taken prior to his PET scan for anxiety related to the procedure.  Conni Slipper, PA-C   All questions were answered. The patient knows to call the clinic with any problems, questions or concerns. We can certainly see the patient much sooner if necessary.

## 2011-12-02 ENCOUNTER — Ambulatory Visit (HOSPITAL_BASED_OUTPATIENT_CLINIC_OR_DEPARTMENT_OTHER): Payer: BC Managed Care – PPO

## 2011-12-02 VITALS — BP 116/73 | HR 76 | Temp 98.7°F

## 2011-12-02 DIAGNOSIS — C8590 Non-Hodgkin lymphoma, unspecified, unspecified site: Secondary | ICD-10-CM

## 2011-12-02 DIAGNOSIS — Z5189 Encounter for other specified aftercare: Secondary | ICD-10-CM

## 2011-12-02 DIAGNOSIS — C8589 Other specified types of non-Hodgkin lymphoma, extranodal and solid organ sites: Secondary | ICD-10-CM

## 2011-12-02 MED ORDER — PEGFILGRASTIM INJECTION 6 MG/0.6ML
6.0000 mg | Freq: Once | SUBCUTANEOUS | Status: AC
  Administered 2011-12-02: 6 mg via SUBCUTANEOUS
  Filled 2011-12-02: qty 0.6

## 2011-12-08 ENCOUNTER — Telehealth: Payer: Self-pay | Admitting: *Deleted

## 2011-12-08 ENCOUNTER — Other Ambulatory Visit (HOSPITAL_BASED_OUTPATIENT_CLINIC_OR_DEPARTMENT_OTHER): Payer: BC Managed Care – PPO

## 2011-12-08 DIAGNOSIS — C8590 Non-Hodgkin lymphoma, unspecified, unspecified site: Secondary | ICD-10-CM

## 2011-12-08 DIAGNOSIS — C8589 Other specified types of non-Hodgkin lymphoma, extranodal and solid organ sites: Secondary | ICD-10-CM

## 2011-12-08 LAB — COMPREHENSIVE METABOLIC PANEL
ALT: 13 U/L (ref 0–53)
AST: 20 U/L (ref 0–37)
Albumin: 3.7 g/dL (ref 3.5–5.2)
Alkaline Phosphatase: 51 U/L (ref 39–117)
BUN: 21 mg/dL (ref 6–23)
CO2: 27 mEq/L (ref 19–32)
Calcium: 9.1 mg/dL (ref 8.4–10.5)
Chloride: 100 mEq/L (ref 96–112)
Creatinine, Ser: 0.94 mg/dL (ref 0.50–1.35)
Glucose, Bld: 94 mg/dL (ref 70–99)
Potassium: 3.7 mEq/L (ref 3.5–5.3)
Sodium: 141 mEq/L (ref 135–145)
Total Bilirubin: 0.6 mg/dL (ref 0.3–1.2)
Total Protein: 5.8 g/dL — ABNORMAL LOW (ref 6.0–8.3)

## 2011-12-08 LAB — CBC WITH DIFFERENTIAL/PLATELET
BASO%: 5.4 % — ABNORMAL HIGH (ref 0.0–2.0)
Basophils Absolute: 0 10*3/uL (ref 0.0–0.1)
EOS%: 2.7 % (ref 0.0–7.0)
Eosinophils Absolute: 0 10*3/uL (ref 0.0–0.5)
HCT: 23.3 % — ABNORMAL LOW (ref 38.4–49.9)
HGB: 7.4 g/dL — ABNORMAL LOW (ref 13.0–17.1)
LYMPH%: 56.8 % — ABNORMAL HIGH (ref 14.0–49.0)
MCH: 25.8 pg — ABNORMAL LOW (ref 27.2–33.4)
MCHC: 31.8 g/dL — ABNORMAL LOW (ref 32.0–36.0)
MCV: 81.2 fL (ref 79.3–98.0)
MONO#: 0 10*3/uL — ABNORMAL LOW (ref 0.1–0.9)
MONO%: 5.4 % (ref 0.0–14.0)
NEUT#: 0.1 10*3/uL — CL (ref 1.5–6.5)
NEUT%: 29.7 % — ABNORMAL LOW (ref 39.0–75.0)
Platelets: 24 10*3/uL — ABNORMAL LOW (ref 140–400)
RBC: 2.87 10*6/uL — ABNORMAL LOW (ref 4.20–5.82)
RDW: 17.8 % — ABNORMAL HIGH (ref 11.0–14.6)
WBC: 0.4 10*3/uL — CL (ref 4.0–10.3)
lymph#: 0.2 10*3/uL — ABNORMAL LOW (ref 0.9–3.3)
nRBC: 0 % (ref 0–0)

## 2011-12-08 NOTE — Telephone Encounter (Signed)
Hbg 7.4, WBC 0.4, ANC 0.1, and plts 24.  Called and spoke to pt, no SOB or fatigue above baseline, denies fever, or any bruising/bleeding.  Per Dr Donnald Garre, pt need 2 units PRBC's.  Scheduled pt to be typed and crossed and transfused on Friday.  Pt is to call if he has any questions or concerns before then.  SLJ

## 2011-12-10 ENCOUNTER — Other Ambulatory Visit: Payer: Self-pay | Admitting: Physician Assistant

## 2011-12-10 ENCOUNTER — Encounter (HOSPITAL_COMMUNITY)
Admission: RE | Admit: 2011-12-10 | Discharge: 2011-12-10 | Disposition: A | Discharge status: Home / Self Care | Payer: BC Managed Care – PPO | Source: Ambulatory Visit | Attending: Internal Medicine | Admitting: Internal Medicine

## 2011-12-10 ENCOUNTER — Other Ambulatory Visit: Payer: Self-pay | Admitting: Internal Medicine

## 2011-12-10 ENCOUNTER — Ambulatory Visit (HOSPITAL_BASED_OUTPATIENT_CLINIC_OR_DEPARTMENT_OTHER): Payer: BC Managed Care – PPO

## 2011-12-10 ENCOUNTER — Other Ambulatory Visit: Payer: Self-pay | Admitting: *Deleted

## 2011-12-10 ENCOUNTER — Other Ambulatory Visit (HOSPITAL_BASED_OUTPATIENT_CLINIC_OR_DEPARTMENT_OTHER): Payer: BC Managed Care – PPO

## 2011-12-10 VITALS — BP 137/91 | HR 78 | Temp 98.3°F | Resp 20

## 2011-12-10 DIAGNOSIS — D649 Anemia, unspecified: Secondary | ICD-10-CM

## 2011-12-10 DIAGNOSIS — C8589 Other specified types of non-Hodgkin lymphoma, extranodal and solid organ sites: Secondary | ICD-10-CM

## 2011-12-10 DIAGNOSIS — C8590 Non-Hodgkin lymphoma, unspecified, unspecified site: Secondary | ICD-10-CM

## 2011-12-10 LAB — MANUAL DIFFERENTIAL
ALC: 0.1 10*3/uL — ABNORMAL LOW (ref 0.9–3.3)
ANC (CHCC manual diff): 0.1 10*3/uL — CL (ref 1.5–6.5)
Band Neutrophils: 4 % (ref 0–10)
Basophil: 10 % — ABNORMAL HIGH (ref 0–2)
Blasts: 1 % — ABNORMAL HIGH (ref 0–0)
EOS: 0 % (ref 0–7)
LYMPH: 39 % (ref 14–49)
MONO: 29 % — ABNORMAL HIGH (ref 0–14)
Metamyelocytes: 0 % (ref 0–0)
Myelocytes: 0 % (ref 0–0)
Other Cell: 0 % (ref 0–0)
PLT EST: DECREASED
PROMYELO: 1 % — ABNORMAL HIGH (ref 0–0)
SEG: 16 % — ABNORMAL LOW (ref 38–77)
Variant Lymph: 0 % (ref 0–0)
nRBC: 118 % — ABNORMAL HIGH (ref 0–0)

## 2011-12-10 LAB — CBC WITH DIFFERENTIAL/PLATELET
HCT: 23.7 % — ABNORMAL LOW (ref 38.4–49.9)
HGB: 7.6 g/dL — ABNORMAL LOW (ref 13.0–17.1)
MCH: 26 pg — ABNORMAL LOW (ref 27.2–33.4)
MCHC: 32.1 g/dL (ref 32.0–36.0)
MCV: 81.2 fL (ref 79.3–98.0)
Platelets: 40 10*3/uL — ABNORMAL LOW (ref 140–400)
RBC: 2.92 10*6/uL — ABNORMAL LOW (ref 4.20–5.82)
RDW: 17.4 % — ABNORMAL HIGH (ref 11.0–14.6)
WBC: 0.3 10*3/uL — CL (ref 4.0–10.3)

## 2011-12-10 LAB — PREPARE RBC (CROSSMATCH)

## 2011-12-10 LAB — ABO/RH: ABO/RH(D): A POS

## 2011-12-10 MED ORDER — ACETAMINOPHEN 325 MG PO TABS
650.0000 mg | ORAL_TABLET | Freq: Once | ORAL | Status: AC
  Administered 2011-12-10: 650 mg via ORAL

## 2011-12-10 MED ORDER — DIPHENHYDRAMINE HCL 50 MG/ML IJ SOLN
25.0000 mg | Freq: Once | INTRAMUSCULAR | Status: AC
  Administered 2011-12-10: 25 mg via INTRAVENOUS

## 2011-12-10 MED ORDER — ACETAMINOPHEN 325 MG PO TABS: 650.0000 mg | ORAL_TABLET | Freq: Once | ORAL | Status: DC

## 2011-12-10 MED ORDER — CIPROFLOXACIN HCL 500 MG PO TABS: 500.0000 mg | ORAL_TABLET | Freq: Two times a day (BID) | ORAL | Status: AC

## 2011-12-10 MED ORDER — DIPHENHYDRAMINE HCL 50 MG/ML IJ SOLN: 25.0000 mg | Freq: Once | INTRAMUSCULAR | Status: DC

## 2011-12-10 MED ORDER — SODIUM CHLORIDE 0.9 % IV SOLN: 500.0000 mL | Freq: Once | INTRAVENOUS | Status: DC

## 2011-12-11 LAB — TYPE AND SCREEN
ABO/RH(D): A POS
Antibody Screen: NEGATIVE
Unit division: 0
Unit division: 0

## 2011-12-14 ENCOUNTER — Telehealth: Payer: Self-pay | Admitting: Oncology

## 2011-12-14 NOTE — Telephone Encounter (Signed)
pt called and needed to r/s his 2/6 appt to 2/8  aom

## 2011-12-15 ENCOUNTER — Other Ambulatory Visit: Payer: BC Managed Care – PPO

## 2011-12-17 ENCOUNTER — Other Ambulatory Visit: Payer: BC Managed Care – PPO

## 2011-12-17 DIAGNOSIS — C8589 Other specified types of non-Hodgkin lymphoma, extranodal and solid organ sites: Secondary | ICD-10-CM

## 2011-12-17 DIAGNOSIS — C8590 Non-Hodgkin lymphoma, unspecified, unspecified site: Secondary | ICD-10-CM

## 2011-12-17 LAB — CBC WITH DIFFERENTIAL/PLATELET
BASO%: 1.2 % (ref 0.0–2.0)
Basophils Absolute: 0.1 10*3/uL (ref 0.0–0.1)
EOS%: 0.2 % (ref 0.0–7.0)
Eosinophils Absolute: 0 10*3/uL (ref 0.0–0.5)
HCT: 32.3 % — ABNORMAL LOW (ref 38.4–49.9)
HGB: 10.4 g/dL — ABNORMAL LOW (ref 13.0–17.1)
LYMPH%: 6.8 % — ABNORMAL LOW (ref 14.0–49.0)
MCH: 27.9 pg (ref 27.2–33.4)
MCHC: 32 g/dL (ref 32.0–36.0)
MCV: 87.1 fL (ref 79.3–98.0)
MONO#: 0.6 10*3/uL (ref 0.1–0.9)
MONO%: 11.1 % (ref 0.0–14.0)
NEUT#: 4.7 10*3/uL (ref 1.5–6.5)
NEUT%: 80.7 % — ABNORMAL HIGH (ref 39.0–75.0)
Platelets: 186 10*3/uL (ref 140–400)
RBC: 3.71 10*6/uL — ABNORMAL LOW (ref 4.20–5.82)
RDW: 20 % — ABNORMAL HIGH (ref 11.0–14.6)
WBC: 5.9 10*3/uL (ref 4.0–10.3)
lymph#: 0.4 10*3/uL — ABNORMAL LOW (ref 0.9–3.3)

## 2011-12-17 LAB — COMPREHENSIVE METABOLIC PANEL
ALT: 17 U/L (ref 0–53)
AST: 30 U/L (ref 0–37)
Albumin: 4.1 g/dL (ref 3.5–5.2)
Alkaline Phosphatase: 61 U/L (ref 39–117)
BUN: 16 mg/dL (ref 6–23)
CO2: 25 mEq/L (ref 19–32)
Calcium: 9.3 mg/dL (ref 8.4–10.5)
Chloride: 103 mEq/L (ref 96–112)
Creatinine, Ser: 1.08 mg/dL (ref 0.50–1.35)
Glucose, Bld: 85 mg/dL (ref 70–99)
Potassium: 4.3 mEq/L (ref 3.5–5.3)
Sodium: 141 mEq/L (ref 135–145)
Total Bilirubin: 0.3 mg/dL (ref 0.3–1.2)
Total Protein: 6.4 g/dL (ref 6.0–8.3)

## 2011-12-22 ENCOUNTER — Other Ambulatory Visit (HOSPITAL_COMMUNITY): Payer: BC Managed Care – PPO

## 2011-12-22 ENCOUNTER — Other Ambulatory Visit: Payer: BC Managed Care – PPO

## 2011-12-23 ENCOUNTER — Other Ambulatory Visit: Payer: Self-pay | Admitting: *Deleted

## 2011-12-23 ENCOUNTER — Telehealth: Payer: Self-pay | Admitting: Internal Medicine

## 2011-12-23 NOTE — Telephone Encounter (Signed)
Pt called as he missed his 2/13 pet and ct,r/s to 2/26 and md 2/27  aom

## 2011-12-24 ENCOUNTER — Telehealth: Payer: Self-pay | Admitting: Internal Medicine

## 2011-12-24 NOTE — Telephone Encounter (Signed)
aware of 2/26 lab  aom

## 2011-12-27 ENCOUNTER — Ambulatory Visit: Payer: BC Managed Care – PPO | Admitting: Internal Medicine

## 2012-01-04 ENCOUNTER — Other Ambulatory Visit (HOSPITAL_BASED_OUTPATIENT_CLINIC_OR_DEPARTMENT_OTHER): Payer: BC Managed Care – PPO

## 2012-01-04 ENCOUNTER — Encounter (HOSPITAL_COMMUNITY)
Admission: RE | Admit: 2012-01-04 | Discharge: 2012-01-04 | Disposition: A | Discharge status: Home / Self Care | Payer: BC Managed Care – PPO | Source: Ambulatory Visit | Attending: Physician Assistant | Admitting: Physician Assistant

## 2012-01-04 ENCOUNTER — Ambulatory Visit (HOSPITAL_COMMUNITY)
Admission: RE | Admit: 2012-01-04 | Discharge: 2012-01-04 | Disposition: A | Discharge status: Home / Self Care | Payer: BC Managed Care – PPO | Source: Ambulatory Visit | Attending: Physician Assistant | Admitting: Physician Assistant

## 2012-01-04 ENCOUNTER — Telehealth: Payer: Self-pay | Admitting: Internal Medicine

## 2012-01-04 DIAGNOSIS — G319 Degenerative disease of nervous system, unspecified: Secondary | ICD-10-CM | POA: Insufficient documentation

## 2012-01-04 DIAGNOSIS — E876 Hypokalemia: Secondary | ICD-10-CM

## 2012-01-04 DIAGNOSIS — L989 Disorder of the skin and subcutaneous tissue, unspecified: Secondary | ICD-10-CM | POA: Insufficient documentation

## 2012-01-04 DIAGNOSIS — C8590 Non-Hodgkin lymphoma, unspecified, unspecified site: Secondary | ICD-10-CM

## 2012-01-04 DIAGNOSIS — Z9221 Personal history of antineoplastic chemotherapy: Secondary | ICD-10-CM | POA: Insufficient documentation

## 2012-01-04 DIAGNOSIS — C8589 Other specified types of non-Hodgkin lymphoma, extranodal and solid organ sites: Secondary | ICD-10-CM

## 2012-01-04 LAB — COMPREHENSIVE METABOLIC PANEL
ALT: 24 U/L (ref 0–53)
AST: 41 U/L — ABNORMAL HIGH (ref 0–37)
Albumin: 4.3 g/dL (ref 3.5–5.2)
Alkaline Phosphatase: 44 U/L (ref 39–117)
BUN: 12 mg/dL (ref 6–23)
CO2: 28 mEq/L (ref 19–32)
Calcium: 9.5 mg/dL (ref 8.4–10.5)
Chloride: 101 mEq/L (ref 96–112)
Creatinine, Ser: 0.85 mg/dL (ref 0.50–1.35)
Glucose, Bld: 123 mg/dL — ABNORMAL HIGH (ref 70–99)
Potassium: 2.9 mEq/L — ABNORMAL LOW (ref 3.5–5.3)
Sodium: 140 mEq/L (ref 135–145)
Total Bilirubin: 1 mg/dL (ref 0.3–1.2)
Total Protein: 7 g/dL (ref 6.0–8.3)

## 2012-01-04 LAB — CBC WITH DIFFERENTIAL/PLATELET
BASO%: 1.9 % (ref 0.0–2.0)
Basophils Absolute: 0.1 10*3/uL (ref 0.0–0.1)
EOS%: 1.2 % (ref 0.0–7.0)
Eosinophils Absolute: 0 10*3/uL (ref 0.0–0.5)
HCT: 32.3 % — ABNORMAL LOW (ref 38.4–49.9)
HGB: 10.5 g/dL — ABNORMAL LOW (ref 13.0–17.1)
LYMPH%: 11.5 % — ABNORMAL LOW (ref 14.0–49.0)
MCH: 27 pg — ABNORMAL LOW (ref 27.2–33.4)
MCHC: 32.5 g/dL (ref 32.0–36.0)
MCV: 83 fL (ref 79.3–98.0)
MONO#: 0.9 10*3/uL (ref 0.1–0.9)
MONO%: 34.2 % — ABNORMAL HIGH (ref 0.0–14.0)
NEUT#: 1.3 10*3/uL — ABNORMAL LOW (ref 1.5–6.5)
NEUT%: 51.2 % (ref 39.0–75.0)
Platelets: 161 10*3/uL (ref 140–400)
RBC: 3.89 10*6/uL — ABNORMAL LOW (ref 4.20–5.82)
RDW: 17.5 % — ABNORMAL HIGH (ref 11.0–14.6)
WBC: 2.6 10*3/uL — ABNORMAL LOW (ref 4.0–10.3)
lymph#: 0.3 10*3/uL — ABNORMAL LOW (ref 0.9–3.3)
nRBC: 0 % (ref 0–0)

## 2012-01-04 LAB — LACTATE DEHYDROGENASE: LDH: 301 U/L — ABNORMAL HIGH (ref 94–250)

## 2012-01-04 LAB — GLUCOSE, CAPILLARY: Glucose-Capillary: 129 mg/dL — ABNORMAL HIGH (ref 70–99)

## 2012-01-04 MED ORDER — POTASSIUM CHLORIDE CRYS ER 20 MEQ PO TBCR: 20.0000 meq | EXTENDED_RELEASE_TABLET | Freq: Two times a day (BID) | ORAL | Status: DC

## 2012-01-04 MED ORDER — FLUDEOXYGLUCOSE F - 18 (FDG) INJECTION
19.1000 | Freq: Once | INTRAVENOUS | Status: AC | PRN
  Administered 2012-01-04: 19.1 via INTRAVENOUS

## 2012-01-04 MED ORDER — IOHEXOL 300 MG/ML  SOLN
100.0000 mL | Freq: Once | INTRAMUSCULAR | Status: AC | PRN
  Administered 2012-01-04: 100 mL via INTRAVENOUS

## 2012-01-04 NOTE — Telephone Encounter (Signed)
Called pt about potssium and need for k+ supplement. I called in rx to [facility] for same

## 2012-01-05 ENCOUNTER — Telehealth: Payer: Self-pay | Admitting: Internal Medicine

## 2012-01-05 ENCOUNTER — Ambulatory Visit (HOSPITAL_BASED_OUTPATIENT_CLINIC_OR_DEPARTMENT_OTHER): Payer: BC Managed Care – PPO | Admitting: Internal Medicine

## 2012-01-05 ENCOUNTER — Encounter: Payer: Self-pay | Admitting: Internal Medicine

## 2012-01-05 VITALS — BP 148/81 | HR 81 | Temp 97.0°F | Ht 69.0 in | Wt 191.5 lb

## 2012-01-05 DIAGNOSIS — C8589 Other specified types of non-Hodgkin lymphoma, extranodal and solid organ sites: Secondary | ICD-10-CM

## 2012-01-05 DIAGNOSIS — C8590 Non-Hodgkin lymphoma, unspecified, unspecified site: Secondary | ICD-10-CM

## 2012-01-05 NOTE — Progress Notes (Signed)
Put fmla forms on nurse's desk. °

## 2012-01-05 NOTE — Progress Notes (Signed)
[facility] Telephone:(336) [phone]   Fax:([phone]  OFFICE PROGRESS NOTE  Carollee Herter, MD, MD [address] [place] [address]  DIAGNOSIS: Stage II, large B-cell non-Hodgkin's lymphoma diagnosed in September 2012 with a scalp lesion. In addition he had mediastinal lymphadenopathy.   PRIOR THERAPY: Systemic chemotherapy with CHOP/Rituxan, given every 3 weeks with Neulasta support, status post 6 cycles.   CURRENT THERAPY: None   INTERVAL HISTORY: Leonard Bentley 51 y.o. male returns to the clinic today for followup visit accompanied his father. The patient tolerated the last cycle of his chemotherapy fairly well with no significant complaints. He now exercises at regular basis, but still has mild fatigue. He denied having any significant weight loss or night sweats, no palpable lymphadenopathy. He has no chest pain or shortness of breath. The patient has repeat PET scan performed recently and he is here today for evaluation and discussion of his scan results  MEDICAL HISTORY: Past Medical History  Diagnosis Date  . ED (erectile dysfunction)   . Cancer 07/2011    STAGE II NON- HODGKIN LYMPHOMA  . Lymphoma, non Hodgkin's 09/25/2011    ALLERGIES:   has no known allergies.  MEDICATIONS:  Current Outpatient Prescriptions  Medication Sig Dispense Refill  . allopurinol (ZYLOPRIM) 100 MG tablet TAKE ONE TABLET BY MOUTH THREE TIMES DAILY  90 tablet  1  . CIALIS 20 MG tablet TAKE 1 TABLET BY MOUTH EVERY Dollar AS NEEDED  6 tablet  0  . potassium chloride SA (K-DUR,KLOR-CON) 20 MEQ tablet Take 1 tablet (20 mEq total) by mouth 2 (two) times daily.  14 tablet  0  . predniSONE (DELTASONE) 50 MG tablet Take 1 tablet (50 mg total) by mouth 2 (two) times daily. TAKE 100 MG PO DAILY FOR FIRST 5 DAYS OF EACH CHEM. CYCLE.  8 tablet  0  . prochlorperazine (COMPAZINE) 10 MG tablet Take 10 mg by mouth every 6 (six) hours as needed.        Marland Kitchen LORazepam (ATIVAN) 0.5  MG tablet Take 1 tablet by mouth 2 hours before PET scan, may repeat 1 hour before scan if needed  2 tablet  0   No current facility-administered medications for this visit.   Facility-Administered Medications Ordered in Other Visits  Medication Dose Route Frequency Provider Last Rate Last Dose  . fludeoxyglucose F - 18 (FDG) injection 19.1 milli Curie  19.1 milli Curie Intravenous Once PRN Medication Radiologist, MD   19.1 milli Curie at 01/04/12 1116  . iohexol (OMNIPAQUE) 300 MG/ML solution 100 mL  100 mL Intravenous Once PRN Medication Radiologist, MD   100 mL at 01/04/12 1308    REVIEW OF SYSTEMS:  A comprehensive review of systems was negative.   PHYSICAL EXAMINATION: General appearance: alert, cooperative and no distress Head: Normocephalic, without obvious abnormality, atraumatic Neck: no adenopathy Lymph nodes: Cervical, supraclavicular, and axillary nodes normal. Resp: clear to auscultation bilaterally Cardio: regular rate and rhythm, S1, S2 normal, no murmur, click, rub or gallop GI: soft, non-tender; bowel sounds normal; no masses,  no organomegaly Extremities: extremities normal, atraumatic, no cyanosis or edema Neurologic: Alert and oriented X 3, normal strength and tone. Normal symmetric reflexes. Normal coordination and gait  ECOG PERFORMANCE STATUS: 0 - Asymptomatic  Blood pressure 148/81, pulse 81, temperature 97 F (36.1 C), temperature source Oral, height 5\' 9"  (1.753 m), weight 191 lb 8 oz (86.864 kg).  LABORATORY DATA: Lab Results  Component Value Date   WBC  2.6* 01/04/2012   HGB 10.5* 01/04/2012   HCT 32.3* 01/04/2012   MCV 83.0 01/04/2012   PLT 161 01/04/2012      Chemistry      Component Value Date/Time   NA 140 01/04/2012 1027   NA 141 10/14/2011 0757   K 2.9* 01/04/2012 1027   K 4.4 10/14/2011 0757   CL 101 01/04/2012 1027   CL 99 10/14/2011 0757   CO2 28 01/04/2012 1027   CO2 30 10/14/2011 0757   BUN 12 01/04/2012 1027   BUN 16 10/14/2011 0757    CREATININE 0.85 01/04/2012 1027   CREATININE 1.0 10/14/2011 0757      Component Value Date/Time   CALCIUM 9.5 01/04/2012 1027   CALCIUM 8.7 10/14/2011 0757   ALKPHOS 44 01/04/2012 1027   ALKPHOS 81 10/14/2011 0757   AST 41* 01/04/2012 1027   AST 32 10/14/2011 0757   ALT 24 01/04/2012 1027   BILITOT 1.0 01/04/2012 1027   BILITOT 0.50 10/14/2011 0757       RADIOGRAPHIC STUDIES: Ct Head [name]  01/04/2012  *RADIOLOGY REPORT*  Clinical Data: Restaging non-Hodgkins lymphoma.  CT HEAD WITHOUT AND WITH CONTRAST  Technique:  Contiguous axial images were obtained from the base of the skull through the vertex without and with intravenous contrast.  Contrast:  Omnipaque-300, 100 ml.  Comparison: Prior CT scans from 08/09/2011 and 10/14/2011.  Findings: There is no evidence for acute infarction, intracranial hemorrhage, mass lesion, hydrocephalus, or extra-axial fluid.  Mild atrophy is present, with mild chronic microvascular ischemic change.  Calvarium appears intact.  There is no sinus or mastoid disease.  Post infusion, there is no abnormal intracranial enhancement. There is no acute sinus or mastoid disease.  Redemonstrated is a 12 x 5 mm left temporal scalp lesion,  similar to priors.  Previous 4 mm scalp lesion has essentially resolved.  IMPRESSION: Mild atrophy and chronic microvascular ischemic change.  No acute intracranial findings.  Stable left temporal scalp lesion, 12 x 5 mm. cross-section.  Original Report Authenticated By: Elsie Stain, M.D.   Nm Pet Image Restag (ps) Skull Base To Thigh  01/04/2012  *RADIOLOGY REPORT*  Clinical Data: Subsequent treatment strategy for large B-cell non Hodgkin's lymphoma.  Initial scalp lesion.  Additional mediastinal metastasis.  The patient is completed four cycles of chemotherapy.  NUCLEAR MEDICINE PET SKULL BASE TO THIGH  Fasting Blood Glucose:  129  Technique:  19.1 mCi F-18 FDG was injected intravenously.   CT data was obtained and used for attenuation  correction and anatomic localization only.  (This was not acquired as a diagnostic CT examination.) Additional exam technical data entered  on technologist worksheet.  Comparison: PET CT scan 08/09/2011, CT scan 10/14/2011  Findings:  Neck: No hypermetabolic nodes in the neck.  Chest:   Elongated prevascular lymph nodes are not changed in size measuring 10 mm short axis (image 82).  These do not have significant metabolic activity above blood pool.  Likewise enlarged paratracheal lymph node in the precarinal location measures 15 mm not changed 16 mm on prior.  This also does not have metabolic activity blood above blood pool ( SUV max = 2.5 ).  No suspicious pulmonary nodules.  Abdomen / Pelvis:No abnormal hypermetabolic activity within the solid organs.  No evidence of abdominal or pelvic hypermetabolic nodes.  Multiple low-density cysts within the liver.  Skeleton:No focal hypermetabolic activity to suggest skeletal metastasis.  IMPRESSION:  Complete response to chemotherapy with no evidence of active residual  lymphoma.  Original Report Authenticated By: Genevive Bi, M.D.    ASSESSMENT: This is a very pleasant 51 years old African American male with history of stage II large B-cell non-Hodgkin lymphoma status post 6 cycles of systemic chemotherapy with CHOP/Rituxan. The patient has complete response to this treatment based on the recent PET scan results. I discussed the scan results with the patient and his father.  PLAN: I recommended for him continuous observation for now with repeat CT scan of the head, chest, abdomen and pelvis in 6 months. He would come back for followup visit at that time. I advised addition to call me immediately if he has any concerning symptoms in the interval.  All questions were answered. The patient knows to call the clinic with any problems, questions or concerns. We can certainly see the patient much sooner if necessary.

## 2012-01-05 NOTE — Telephone Encounter (Signed)
Renew both of these. Make sure he has a recent testosterone done.

## 2012-01-06 ENCOUNTER — Telehealth: Payer: Self-pay | Admitting: Internal Medicine

## 2012-01-06 ENCOUNTER — Other Ambulatory Visit: Payer: Self-pay

## 2012-01-06 DIAGNOSIS — C8590 Non-Hodgkin lymphoma, unspecified, unspecified site: Secondary | ICD-10-CM

## 2012-01-06 MED ORDER — LORAZEPAM 0.5 MG PO TABS: ORAL_TABLET | ORAL | Status: DC

## 2012-01-06 NOTE — Telephone Encounter (Signed)
Left message for pt to make appt.

## 2012-01-06 NOTE — Telephone Encounter (Signed)
Called med in 

## 2012-01-06 NOTE — Telephone Encounter (Signed)
gv pt appt schedule for aug including ct scan prior to leaving yesterday.

## 2012-01-07 ENCOUNTER — Telehealth: Payer: Self-pay | Admitting: Internal Medicine

## 2012-01-07 DIAGNOSIS — C8590 Non-Hodgkin lymphoma, unspecified, unspecified site: Secondary | ICD-10-CM

## 2012-01-07 MED ORDER — LORAZEPAM 0.5 MG PO TABS: ORAL_TABLET | ORAL | Status: DC

## 2012-01-07 NOTE — Telephone Encounter (Signed)
Called to say his FMLA papers need to be faxed today and no later than Monday 01/10/12. I told him I will pass on to [facility]

## 2012-01-07 NOTE — Telephone Encounter (Signed)
Called # 30 PILLS IN 0 REFILL PER [facility]

## 2012-01-07 NOTE — Telephone Encounter (Signed)
Go ahead and give him the Ativan

## 2012-01-10 ENCOUNTER — Encounter: Payer: Self-pay | Admitting: Internal Medicine

## 2012-01-10 NOTE — Progress Notes (Signed)
Faxed fmla papers to [employer] attn Kathrynn Running @ [phone]; put originals in registration desk.

## 2012-01-27 ENCOUNTER — Telehealth: Payer: Self-pay | Admitting: Medical Oncology

## 2012-01-27 NOTE — Telephone Encounter (Signed)
Short of breath and dizziness this week a couple of times a [place] and achy when he works out. Per Dr Donnald Garre I told pt that he needs to rest and give his body time to heal . Pt voices understanding.

## 2012-03-08 ENCOUNTER — Ambulatory Visit (INDEPENDENT_AMBULATORY_CARE_PROVIDER_SITE_OTHER): Payer: BC Managed Care – PPO | Admitting: Medical

## 2012-03-08 ENCOUNTER — Encounter: Payer: Self-pay | Admitting: Medical

## 2012-03-08 VITALS — BP 122/80 | HR 64 | Temp 98.1°F | Resp 16 | Wt 186.0 lb

## 2012-03-08 DIAGNOSIS — Z1211 Encounter for screening for malignant neoplasm of colon: Secondary | ICD-10-CM

## 2012-03-08 DIAGNOSIS — K649 Unspecified hemorrhoids: Secondary | ICD-10-CM | POA: Insufficient documentation

## 2012-03-08 DIAGNOSIS — Z125 Encounter for screening for malignant neoplasm of prostate: Secondary | ICD-10-CM

## 2012-03-08 DIAGNOSIS — K921 Melena: Secondary | ICD-10-CM

## 2012-03-08 MED ORDER — HYDROCORTISONE ACETATE 25 MG RE SUPP: 25.0000 mg | Freq: Two times a day (BID) | RECTAL | Status: AC

## 2012-03-08 NOTE — Progress Notes (Signed)
Subjective: Here for c/o hemorrhoids for sevearl months.  4 wk had really big one that lasted 2 wk.  Today when he went to the bathroom, saw blood on the tissue, had soreness in the rectum, and had lot of blood in the toilet.  Blood was bright red.  Thinks he has had hemorrhoids for few years.  Usually gets some external hemorrhoids.  Gets itching, but has only been painful once.  Today had some constipation.   But usually not constipated.  Usually has BMs twice daily.  He runs early in the morning daily for exercise.   Usually goes for BM first in the mornings, then after his run has to go again.  He also does weight lifting, strains some with this. He has never had a colonoscopy.    He has hx/o non hodgkin's lymphoma in remission, sees oncology q42mo, next visit August 2013.    Past Medical History  Diagnosis Date  . ED (erectile dysfunction)   . Cancer 07/2011    STAGE II NON- HODGKIN LYMPHOMA  . Lymphoma, non Hodgkin's 09/25/2011    ROS Gen: no fever, chills, weight changes Heart: no CP, palpitations Lungs: no sob, wheezing GI: no belly pain, NVD GU: no dysuria    Objective:   Physical Exam  Filed Vitals:   03/08/12 1530  BP: 122/80  Pulse: 64  Temp: 98.1 F (36.7 C)  Resp: 16    General appearance: alert, no distress, WD/WN, black male Abdomen: +bs, soft, non tender, non distended, no masses, no hepatomegaly, no splenomegaly Rectal: small inferior external hemorrhoids, internal hemorrhoids palpated, prostate nontender WNL, occult negative stool   Assessment and Plan :    Encounter Diagnoses  Name Primary?  . Hemorrhoid Yes  . Blood in stool   . Prostate cancer screening   . Colon cancer screening    Hemorrhoids - discussed prevention, diet, symptomatic measures, script for Anusol.     Blood in stool - most likely hemorrhoids, but given age over 46yo, hx/o non hodgkin, will refer for first screening colonoscopy  Prostate cancer screening - discussed risk/benefits  of testing, PSA today  Referral to Dr. Bernarda Caffey for first screening colonoscopy  Follow-up pending lab

## 2012-03-08 NOTE — Patient Instructions (Signed)
Hemorrhoids     The key is prevention.  Eat plenty of fiber in your diet, drink plenty of water daily, avoid straining or heavy lifting.   You can use Preparation H OTC for itching.  But for bleeding, worse irritation or pain, use Anusol suppository prescription  Keep the anus clean with soap and water  You can also use Epsom Salt warm water soaks in the bath to help with irritated  Hemorrhoids Hemorrhoids are enlarged (dilated) veins around the rectum. There are 2 types of hemorrhoids, and the type of hemorrhoid is determined by its location. Internal hemorrhoids occur in the veins just inside the rectum.They are usually not painful, but they may bleed.However, they may poke through to the outside and become irritated and painful. External hemorrhoids involve the veins outside the anus and can be felt as a painful swelling or hard lump near the anus.They are often itchy and may crack and bleed. Sometimes clots will form in the veins. This makes them swollen and painful. These are called thrombosed hemorrhoids. CAUSES Causes of hemorrhoids include:  Pregnancy. This increases the pressure in the hemorrhoidal veins.   Constipation.   Straining to have a bowel movement.   Obesity.   Heavy lifting or other activity that caused you to strain.  TREATMENT Most of the time hemorrhoids improve in 1 to 2 weeks. However, if symptoms do not seem to be getting better or if you have a lot of rectal bleeding, your caregiver may perform a procedure to help make the hemorrhoids get smaller or remove them completely.Possible treatments include:  Rubber band ligation. A rubber band is placed at the base of the hemorrhoid to cut off the circulation.   Sclerotherapy. A chemical is injected to shrink the hemorrhoid.   Infrared light therapy. Tools are used to burn the hemorrhoid.   Hemorrhoidectomy. This is surgical removal of the hemorrhoid.  HOME CARE INSTRUCTIONS   Increase fiber in your diet.  Ask your caregiver about using fiber supplements.   Drink enough water and fluids to keep your urine clear or pale yellow.   Exercise regularly.   Go to the bathroom when you have the urge to have a bowel movement. Do not wait.   Avoid straining to have bowel movements.   Keep the anal area dry and clean.   Only take over-the-counter or prescription medicines for pain, discomfort, or fever as directed by your caregiver.  If your hemorrhoids are thrombosed:  Take warm sitz baths for 20 to 30 minutes, 3 to 4 times per [place].   If the hemorrhoids are very tender and swollen, place ice packs on the area as tolerated. Using ice packs between sitz baths may be helpful. Fill a plastic bag with ice. Place a towel between the bag of ice and your skin.   Medicated creams and suppositories may be used or applied as directed.   Do not use a donut-shaped pillow or sit on the toilet for long periods. This increases blood pooling and pain.  SEEK MEDICAL CARE IF:   You have increasing pain and swelling that is not controlled with your medicine.   You have uncontrolled bleeding.   You have difficulty or you are unable to have a bowel movement.   You have pain or inflammation outside the area of the hemorrhoids.   You have chills or an oral temperature above 102 F (38.9 C).  MAKE SURE YOU:   Understand these instructions.   Will watch your condition.  Will get help right away if you are not doing well or get worse.  Document Released: 10/22/2000 Document Revised: 10/14/2011 Document Reviewed: 02/27/2008 [facility] Patient Information 2012 [name], [place].

## 2012-03-09 LAB — PSA: PSA: 1.29 ng/mL (ref <=4.00)

## 2012-04-10 ENCOUNTER — Telehealth: Payer: Self-pay | Admitting: Medical Oncology

## 2012-04-10 NOTE — Telephone Encounter (Signed)
Pt left me a message to return his call.

## 2012-04-10 NOTE — Telephone Encounter (Signed)
Still has numbness/ tingling  in feet and fingertips -  He is a little unbalanced in the am, but as Boniface progresses this seems better.  He denies pain in feet/hands . " Does it take a long time to get better?" I told him I will consult with Dr Donnald Garre and call him back.

## 2012-04-11 ENCOUNTER — Telehealth: Payer: Self-pay | Admitting: Medical Oncology

## 2012-04-11 NOTE — Telephone Encounter (Signed)
Left message for pt with information about numbness and tingling.

## 2012-04-11 NOTE — Telephone Encounter (Signed)
Unable to reach pt

## 2012-04-21 ENCOUNTER — Encounter: Payer: Self-pay | Admitting: Medical

## 2012-05-01 ENCOUNTER — Encounter: Payer: Self-pay | Admitting: Medical

## 2012-06-30 ENCOUNTER — Other Ambulatory Visit: Payer: Self-pay | Admitting: Internal Medicine

## 2012-06-30 ENCOUNTER — Other Ambulatory Visit (HOSPITAL_BASED_OUTPATIENT_CLINIC_OR_DEPARTMENT_OTHER): Payer: BC Managed Care – PPO | Admitting: Lab

## 2012-06-30 ENCOUNTER — Ambulatory Visit (HOSPITAL_COMMUNITY)
Admission: RE | Admit: 2012-06-30 | Discharge: 2012-06-30 | Disposition: A | Discharge status: Home / Self Care | Payer: BC Managed Care – PPO | Source: Ambulatory Visit | Attending: Internal Medicine | Admitting: Internal Medicine

## 2012-06-30 DIAGNOSIS — C8590 Non-Hodgkin lymphoma, unspecified, unspecified site: Secondary | ICD-10-CM

## 2012-06-30 DIAGNOSIS — K7689 Other specified diseases of liver: Secondary | ICD-10-CM | POA: Insufficient documentation

## 2012-06-30 DIAGNOSIS — I771 Stricture of artery: Secondary | ICD-10-CM | POA: Insufficient documentation

## 2012-06-30 DIAGNOSIS — C8589 Other specified types of non-Hodgkin lymphoma, extranodal and solid organ sites: Secondary | ICD-10-CM | POA: Insufficient documentation

## 2012-06-30 DIAGNOSIS — R599 Enlarged lymph nodes, unspecified: Secondary | ICD-10-CM | POA: Insufficient documentation

## 2012-06-30 DIAGNOSIS — Q619 Cystic kidney disease, unspecified: Secondary | ICD-10-CM | POA: Insufficient documentation

## 2012-06-30 LAB — CMP (CANCER CENTER ONLY)
ALT(SGPT): 39 U/L (ref 10–47)
AST: 62 U/L — ABNORMAL HIGH (ref 11–38)
Albumin: 4.4 g/dL (ref 3.3–5.5)
Alkaline Phosphatase: 43 U/L (ref 26–84)
BUN, Bld: 14 mg/dL (ref 7–22)
CO2: 30 mEq/L (ref 18–33)
Calcium: 8.7 mg/dL (ref 8.0–10.3)
Chloride: 97 mEq/L — ABNORMAL LOW (ref 98–108)
Creat: 1 mg/dl (ref 0.6–1.2)
Glucose, Bld: 107 mg/dL (ref 73–118)
Potassium: 3.8 mEq/L (ref 3.3–4.7)
Sodium: 137 mEq/L (ref 128–145)
Total Bilirubin: 1.6 mg/dl (ref 0.20–1.60)
Total Protein: 6.9 g/dL (ref 6.4–8.1)

## 2012-06-30 LAB — CBC WITH DIFFERENTIAL/PLATELET
BASO%: 1.4 % (ref 0.0–2.0)
Basophils Absolute: 0 10*3/uL (ref 0.0–0.1)
EOS%: 1.5 % (ref 0.0–7.0)
Eosinophils Absolute: 0.1 10*3/uL (ref 0.0–0.5)
HCT: 37 % — ABNORMAL LOW (ref 38.4–49.9)
HGB: 11.9 g/dL — ABNORMAL LOW (ref 13.0–17.1)
LYMPH%: 17.2 % (ref 14.0–49.0)
MCH: 26.6 pg — ABNORMAL LOW (ref 27.2–33.4)
MCHC: 32.3 g/dL (ref 32.0–36.0)
MCV: 82.3 fL (ref 79.3–98.0)
MONO#: 0.6 10*3/uL (ref 0.1–0.9)
MONO%: 17.1 % — ABNORMAL HIGH (ref 0.0–14.0)
NEUT#: 2.2 10*3/uL (ref 1.5–6.5)
NEUT%: 62.8 % (ref 39.0–75.0)
Platelets: 155 10*3/uL (ref 140–400)
RBC: 4.49 10*6/uL (ref 4.20–5.82)
RDW: 14.2 % (ref 11.0–14.6)
WBC: 3.6 10*3/uL — ABNORMAL LOW (ref 4.0–10.3)
lymph#: 0.6 10*3/uL — ABNORMAL LOW (ref 0.9–3.3)

## 2012-06-30 MED ORDER — IOHEXOL 300 MG/ML  SOLN
100.0000 mL | Freq: Once | INTRAMUSCULAR | Status: AC | PRN
  Administered 2012-06-30: 100 mL via INTRAVENOUS

## 2012-07-04 ENCOUNTER — Ambulatory Visit (HOSPITAL_BASED_OUTPATIENT_CLINIC_OR_DEPARTMENT_OTHER): Payer: BC Managed Care – PPO | Admitting: Internal Medicine

## 2012-07-04 VITALS — BP 163/109 | HR 90 | Temp 96.7°F | Resp 20 | Ht 69.0 in | Wt 180.2 lb

## 2012-07-04 DIAGNOSIS — C8590 Non-Hodgkin lymphoma, unspecified, unspecified site: Secondary | ICD-10-CM

## 2012-07-04 DIAGNOSIS — C8581 Other specified types of non-Hodgkin lymphoma, lymph nodes of head, face, and neck: Secondary | ICD-10-CM

## 2012-07-04 NOTE — Patient Instructions (Signed)
Your lab and scan results are good today except for mild anemia but better than the previous lab results. Followup in 6 months with repeat CT scan of the head, chest, abdomen and pelvis. Please call if you have any concerning symptoms in the interval.

## 2012-07-04 NOTE — Progress Notes (Signed)
[facility] Telephone:(336) [phone]   Fax:(336) [phone]  OFFICE PROGRESS NOTE  Carollee Herter, MD [address] [place] [address]  DIAGNOSIS: Stage II, large B-cell non-Hodgkin's lymphoma diagnosed in September 2012 with a scalp lesion. In addition he had mediastinal lymphadenopathy.   PRIOR THERAPY: Systemic chemotherapy with CHOP/Rituxan, given every 3 weeks with Neulasta support, status post 6 cycles, last dose was given 12/01/2011 with complete response.   CURRENT THERAPY: None  INTERVAL HISTORY: Leonard Bentley 51 y.o. male returns to the clinic today for routine six-month followup visit. The patient is doing fine with no specific complaints. He started having more stamina and was able to do more physical activity. He denied having any significant weight loss or night sweats. He has no palpable lymphadenopathy. The patient denied having any significant chest pain, shortness breath, cough or hemoptysis. He has repeat CT scan of the head, chest, abdomen and pelvis and he is here today for evaluation and discussion of his scan results.  MEDICAL HISTORY: Past Medical History  Diagnosis Date  . ED (erectile dysfunction)   . Cancer 07/2011    STAGE II NON- HODGKIN LYMPHOMA  . Lymphoma, non Hodgkin's 09/25/2011    ALLERGIES:   has no known allergies.  MEDICATIONS:  Current Outpatient Prescriptions  Medication Sig Dispense Refill  . CIALIS 20 MG tablet TAKE 1 TABLET BY MOUTH EVERY [place] AS NEEDED  6 tablet  0    REVIEW OF SYSTEMS:  A comprehensive review of systems was negative.   PHYSICAL EXAMINATION: General appearance: alert, cooperative and no distress Head: Normocephalic, without obvious abnormality, atraumatic Neck: no adenopathy Lymph nodes: Cervical, supraclavicular, and axillary nodes normal. Resp: clear to auscultation bilaterally Cardio: regular rate and rhythm, S1, S2 normal, no murmur, click, rub or gallop GI: soft, non-tender;  bowel sounds normal; no masses,  no organomegaly Extremities: extremities normal, atraumatic, no cyanosis or edema Neurologic: Alert and oriented X 3, normal strength and tone. Normal symmetric reflexes. Normal coordination and gait  ECOG PERFORMANCE STATUS: 1 - Symptomatic but completely ambulatory  Blood pressure 163/109, pulse 90, temperature 96.7 F (35.9 C), temperature source Oral, resp. rate 20, height 5\' 9"  (1.753 m), weight 180 lb 3.2 oz (81.738 kg).  LABORATORY DATA: Lab Results  Component Value Date   WBC 3.6* 06/30/2012   HGB 11.9* 06/30/2012   HCT 37.0* 06/30/2012   MCV 82.3 06/30/2012   PLT 155 06/30/2012      Chemistry      Component Value Date/Time   NA 137 06/30/2012 0754   NA 140 01/04/2012 1027   K 3.8 06/30/2012 0754   K 2.9* 01/04/2012 1027   CL 97* 06/30/2012 0754   CL 101 01/04/2012 1027   CO2 30 06/30/2012 0754   CO2 28 01/04/2012 1027   BUN 14 06/30/2012 0754   BUN 12 01/04/2012 1027   CREATININE 1.0 06/30/2012 0754   CREATININE 0.85 01/04/2012 1027      Component Value Date/Time   CALCIUM 8.7 06/30/2012 0754   CALCIUM 9.5 01/04/2012 1027   ALKPHOS 43 06/30/2012 0754   ALKPHOS 44 01/04/2012 1027   AST 62* 06/30/2012 0754   AST 41* 01/04/2012 1027   ALT 24 01/04/2012 1027   BILITOT 1.60 06/30/2012 0754   BILITOT 1.0 01/04/2012 1027       RADIOGRAPHIC STUDIES: Ct Head [name]  06/30/2012  *RADIOLOGY REPORT*  Clinical Data: Non-Hodgkins lymphoma.  CT HEAD WITHOUT AND WITH CONTRAST  Technique:  Contiguous axial images were obtained from the base of the skull through the vertex without and with intravenous contrast.  Contrast: OMNIPAQUE IOHEXOL 300 MG/ML  SOLN  Comparison: CT of the head without and with contrast 01/04/2012.  Findings: No acute cortical infarct, hemorrhage, mass lesion is present.  The postcontrast images demonstrate no pathologic enhancement to suggest metastatic disease of the brain or meninges.  The ventricles are of normal size.  No  significant extra-axial fluid collection is present.  The focal soft tissue lesion within the left temporal scalp is less prominent on today's exam.  No other significant extracranial lesions are evident.  IMPRESSION:  1.  Normal CT appearance of the brain without evidence for metastatic disease. 2.  Persistent this soft tissue skin lesion in the left temporal scalp is slightly less prominent than on the prior study.   Original Report Authenticated By: Jamesetta Orleans. MATTERN, M.D.    Ct Chest W Contrast  06/30/2012  *RADIOLOGY REPORT*  Clinical Data:  Non-Hodgkins lymphoma diagnosed in left temporal area.  Chemotherapy complete 02/13.  No chest or abdominal complaints.  CT CHEST, ABDOMEN AND PELVIS WITH CONTRAST  Technique: Contiguous axial images of the chest abdomen and pelvis were obtained after IV contrast administration.  Contrast: 100  ml Omnipaque-300  Comparison: P E T of 01/04/2012.  Chest, abdomen, and pelvic CTs of 10/14/2011.   CT CHEST  Findings: Lung windows demonstrate similar or decrease size of 4 mm perifissural right upper lobe nodule on image 36, which is likely a subpleural lymph node.  Clear left lung.  Soft tissue windows demonstrate no supraclavicular adenopathy.  No axillary adenopathy.  Tortuous descending thoracic aorta.  Heart size upper normal, without pericardial or pleural effusion.  No central pulmonary embolism, on this non-dedicated study.  A pretracheal node measures 1.0 cm on image 18 versus 1.1 cm on 10/14/2011.  1.5 cm on 01/04/2012. Subcarinal node measures 1.7 cm on image 29 versus 1.8 cm on 10/14/2011. Right hilar node measures 1.7 cm on image 27 versus 1.8 cm on the prior (when remeasured).  Left suprahilar adenopathy is similar. Prevascular nodes measure  9 mm short axis on image 25 and is similar to on both prior exams.  IMPRESSION: Similar to minimal improvement in thoracic adenopathy as detailed above.  No evidence of new or progressive disease.   CT ABDOMEN AND  PELVIS  Findings:  Well-circumscribed hypoattenuating liver lesions, consistent with cysts.  Minimal complexity within the right hepatic cyst on image 66.  Normal spleen, stomach.  The pancreatic duct is borderline prominent, unchanged.  No cause identified.  Normal gallbladder, biliary tract, adrenal glands.  Interpolar left renal cyst.  Early excretion of contrast in the renal collecting systems bilaterally.  Too small to characterize left renal lesion.  No retroperitoneal or retrocrural adenopathy.  Normal colon, appendix, and terminal ileum.  Normal small bowel without abdominal ascites.    No evidence of omental or peritoneal disease.  No pelvic adenopathy.  The external iliac arteries are both mildly aneurysmal at 1.6 cm on the left and 1.7 cm on the right.  This may be slightly increased since 10/14/2011.  Left common iliac artery is markedly tortuous and aneurysmal.  2.4 cm on coronal image 43. Unchanged.  The right common iliac artery is mildly aneurysmal 1.5 cm on coronal image 37, also unchanged.  Normal urinary bladder and prostate.  No significant free fluid. No acute osseous abnormality.  IMPRESSION:  1. No acute process or evidence  of active lymphoma within the abdomen. 2.  Aneurysmal dilatation of the pelvic arteries, as detailed above.   Original Report Authenticated By: Consuello Bossier, M.D.     ASSESSMENT: This is a very pleasant 51 years old [place] American male with history of stage II large B-cell non-Hodgkin lymphoma status post 6 cycles of systemic chemotherapy with CHOP/Rituxan and the patient has been observation since January of 2013. The patient is doing fine and he has no evidence for disease recurrence.  PLAN: I discussed the scan results with the patient. I recommended for him to continue on observation for now with repeat CT scan of the head, chest, abdomen and pelvis in 6 months. He would come back for followup visit at that time. He was advised to call me immediately she has  any concerning symptoms in the interval.  All questions were answered. The patient knows to call the clinic with any problems, questions or concerns. We can certainly see the patient much sooner if necessary.

## 2012-07-06 ENCOUNTER — Telehealth: Payer: Self-pay | Admitting: Internal Medicine

## 2012-07-06 NOTE — Telephone Encounter (Signed)
lmonvm for pt re appt for 12/28/2012 lb/ct and 01/01/2013 MM. Schedule mailed.

## 2012-08-10 ENCOUNTER — Telehealth: Payer: Self-pay | Admitting: Medical Oncology

## 2012-08-10 DIAGNOSIS — G629 Polyneuropathy, unspecified: Secondary | ICD-10-CM

## 2012-08-10 MED ORDER — GABAPENTIN 300 MG PO CAPS: 300.0000 mg | ORAL_CAPSULE | Freq: Three times a day (TID) | ORAL | Status: DC

## 2012-08-10 NOTE — Telephone Encounter (Signed)
Last chemo 6 months ago and his feet are still numb. " Is this common?"

## 2012-08-10 NOTE — Telephone Encounter (Signed)
Per Adrena -pt may have residual neuropathy -If he wants,  Adrena will order Neurontin 100 mg po tid. I was unable to reach pt or leave a message with this information

## 2012-08-10 NOTE — Telephone Encounter (Signed)
Pt currently taking flexeril and ibuprophen for 5 days for back injury. i told  Him to start neurontin after he finishes the flexeril and ibuprophen. Called in rx to [facility]

## 2012-08-23 ENCOUNTER — Inpatient Hospital Stay (HOSPITAL_COMMUNITY)
Admission: EM | Admit: 2012-08-23 | Discharge: 2012-08-25 | DRG: 750 | Disposition: A | Discharge status: Home / Self Care | Payer: BC Managed Care – PPO | Attending: Family Medicine | Admitting: Family Medicine

## 2012-08-23 ENCOUNTER — Encounter (HOSPITAL_COMMUNITY): Payer: Self-pay | Admitting: Emergency Medicine

## 2012-08-23 DIAGNOSIS — R112 Nausea with vomiting, unspecified: Secondary | ICD-10-CM | POA: Diagnosis present

## 2012-08-23 DIAGNOSIS — F1010 Alcohol abuse, uncomplicated: Secondary | ICD-10-CM

## 2012-08-23 DIAGNOSIS — F10939 Alcohol use, unspecified with withdrawal, unspecified: Principal | ICD-10-CM | POA: Diagnosis present

## 2012-08-23 DIAGNOSIS — C8589 Other specified types of non-Hodgkin lymphoma, extranodal and solid organ sites: Secondary | ICD-10-CM | POA: Diagnosis present

## 2012-08-23 DIAGNOSIS — F17200 Nicotine dependence, unspecified, uncomplicated: Secondary | ICD-10-CM | POA: Diagnosis present

## 2012-08-23 DIAGNOSIS — F1020 Alcohol dependence, uncomplicated: Secondary | ICD-10-CM | POA: Diagnosis present

## 2012-08-23 DIAGNOSIS — R7402 Elevation of levels of lactic acid dehydrogenase (LDH): Secondary | ICD-10-CM | POA: Diagnosis present

## 2012-08-23 DIAGNOSIS — R7401 Elevation of levels of liver transaminase levels: Secondary | ICD-10-CM

## 2012-08-23 DIAGNOSIS — D6959 Other secondary thrombocytopenia: Secondary | ICD-10-CM | POA: Diagnosis present

## 2012-08-23 DIAGNOSIS — F10231 Alcohol dependence with withdrawal delirium: Secondary | ICD-10-CM

## 2012-08-23 DIAGNOSIS — F10239 Alcohol dependence with withdrawal, unspecified: Principal | ICD-10-CM

## 2012-08-23 DIAGNOSIS — C8590 Non-Hodgkin lymphoma, unspecified, unspecified site: Secondary | ICD-10-CM

## 2012-08-23 DIAGNOSIS — F17210 Nicotine dependence, cigarettes, uncomplicated: Secondary | ICD-10-CM

## 2012-08-23 MED ORDER — LORAZEPAM 2 MG/ML IJ SOLN
1.0000 mg | Freq: Four times a day (QID) | INTRAMUSCULAR | Status: DC | PRN
  Administered 2012-08-23 – 2012-08-25 (×4): 1 mg via INTRAVENOUS
  Filled 2012-08-23 (×5): qty 1

## 2012-08-23 MED ORDER — ADULT MULTIVITAMIN W/MINERALS CH
1.0000 | ORAL_TABLET | Freq: Every day | ORAL | Status: DC
  Filled 2012-08-23: qty 1

## 2012-08-23 MED ORDER — ONDANSETRON HCL 4 MG/2ML IJ SOLN
INTRAMUSCULAR | Status: AC
  Filled 2012-08-23: qty 2

## 2012-08-23 MED ORDER — LORAZEPAM 1 MG PO TABS: 1.0000 mg | ORAL_TABLET | Freq: Four times a day (QID) | ORAL | Status: DC | PRN

## 2012-08-23 MED ORDER — VITAMIN B-1 100 MG PO TABS
100.0000 mg | ORAL_TABLET | Freq: Every day | ORAL | Status: DC
  Administered 2012-08-24 – 2012-08-25 (×3): 100 mg via ORAL
  Filled 2012-08-23 (×3): qty 1

## 2012-08-23 MED ORDER — FOLIC ACID 1 MG PO TABS
1.0000 mg | ORAL_TABLET | Freq: Every day | ORAL | Status: DC
  Administered 2012-08-24 – 2012-08-25 (×3): 1 mg via ORAL
  Filled 2012-08-23 (×3): qty 1

## 2012-08-23 MED ORDER — THIAMINE HCL 100 MG/ML IJ SOLN
100.0000 mg | Freq: Every day | INTRAMUSCULAR | Status: DC
  Filled 2012-08-23 (×2): qty 1

## 2012-08-23 NOTE — ED Notes (Signed)
ZOX:[id number]<AV> Expected date:<BR> Expected time:<BR> Means of arrival:<BR> Comments:<BR> EMS/alcohol withdrawal-vomiting/SI

## 2012-08-23 NOTE — ED Notes (Signed)
Per EMS pt transported from vehicle on side of road after vomiting, last drink noon today. 02 sat 95% initially, then 99% on 2L [place]. IV est #18L AC, NS infusing, Zofran 4mg  IVP given. Pt states he feels SHOB.

## 2012-08-24 ENCOUNTER — Encounter (HOSPITAL_COMMUNITY): Payer: Self-pay | Admitting: Internal Medicine

## 2012-08-24 ENCOUNTER — Emergency Department (HOSPITAL_COMMUNITY): Payer: BC Managed Care – PPO

## 2012-08-24 DIAGNOSIS — F10931 Alcohol use, unspecified with withdrawal delirium: Secondary | ICD-10-CM | POA: Insufficient documentation

## 2012-08-24 DIAGNOSIS — F17200 Nicotine dependence, unspecified, uncomplicated: Secondary | ICD-10-CM

## 2012-08-24 DIAGNOSIS — R7401 Elevation of levels of liver transaminase levels: Secondary | ICD-10-CM

## 2012-08-24 DIAGNOSIS — F1010 Alcohol abuse, uncomplicated: Secondary | ICD-10-CM

## 2012-08-24 DIAGNOSIS — F10231 Alcohol dependence with withdrawal delirium: Secondary | ICD-10-CM

## 2012-08-24 DIAGNOSIS — F17210 Nicotine dependence, cigarettes, uncomplicated: Secondary | ICD-10-CM

## 2012-08-24 DIAGNOSIS — R7402 Elevation of levels of lactic acid dehydrogenase (LDH): Secondary | ICD-10-CM

## 2012-08-24 DIAGNOSIS — F10239 Alcohol dependence with withdrawal, unspecified: Principal | ICD-10-CM

## 2012-08-24 LAB — RAPID URINE DRUG SCREEN, HOSP PERFORMED
Amphetamines: NOT DETECTED
Barbiturates: NOT DETECTED
Benzodiazepines: NOT DETECTED
Cocaine: NOT DETECTED
Opiates: NOT DETECTED
Tetrahydrocannabinol: NOT DETECTED

## 2012-08-24 LAB — CBC WITH DIFFERENTIAL/PLATELET
Basophils Absolute: 0 10*3/uL (ref 0.0–0.1)
Basophils Relative: 1 % (ref 0–1)
Eosinophils Absolute: 0 10*3/uL (ref 0.0–0.7)
Eosinophils Relative: 0 % (ref 0–5)
HCT: 36.9 % — ABNORMAL LOW (ref 39.0–52.0)
Hemoglobin: 12.3 g/dL — ABNORMAL LOW (ref 13.0–17.0)
Lymphocytes Relative: 8 % — ABNORMAL LOW (ref 12–46)
Lymphs Abs: 0.4 10*3/uL — ABNORMAL LOW (ref 0.7–4.0)
MCH: 25.9 pg — ABNORMAL LOW (ref 26.0–34.0)
MCHC: 33.3 g/dL (ref 30.0–36.0)
MCV: 77.7 fL — ABNORMAL LOW (ref 78.0–100.0)
Monocytes Absolute: 0.4 10*3/uL (ref 0.1–1.0)
Monocytes Relative: 10 % (ref 3–12)
Neutro Abs: 3.6 10*3/uL (ref 1.7–7.7)
Neutrophils Relative %: 81 % — ABNORMAL HIGH (ref 43–77)
Platelets: 135 10*3/uL — ABNORMAL LOW (ref 150–400)
RBC: 4.75 MIL/uL (ref 4.22–5.81)
RDW: 15.6 % — ABNORMAL HIGH (ref 11.5–15.5)
WBC: 4.4 10*3/uL (ref 4.0–10.5)

## 2012-08-24 LAB — COMPREHENSIVE METABOLIC PANEL
ALT: 63 U/L — ABNORMAL HIGH (ref 0–53)
ALT: 77 U/L — ABNORMAL HIGH (ref 0–53)
AST: 103 U/L — ABNORMAL HIGH (ref 0–37)
AST: 158 U/L — ABNORMAL HIGH (ref 0–37)
Albumin: 4 g/dL (ref 3.5–5.2)
Albumin: 3.7 g/dL (ref 3.5–5.2)
Alkaline Phosphatase: 48 U/L (ref 39–117)
Alkaline Phosphatase: 45 U/L (ref 39–117)
BUN: 11 mg/dL (ref 6–23)
BUN: 11 mg/dL (ref 6–23)
CO2: 25 mEq/L (ref 19–32)
CO2: 29 mEq/L (ref 19–32)
Calcium: 8.6 mg/dL (ref 8.4–10.5)
Calcium: 8.4 mg/dL (ref 8.4–10.5)
Chloride: 97 mEq/L (ref 96–112)
Chloride: 96 mEq/L (ref 96–112)
Creatinine, Ser: 0.95 mg/dL (ref 0.50–1.35)
Creatinine, Ser: 0.9 mg/dL (ref 0.50–1.35)
GFR calc Af Amer: >90 mL/min (ref >90)
GFR calc Af Amer: >90 mL/min (ref >90)
GFR calc non Af Amer: >90 mL/min (ref >90)
GFR calc non Af Amer: >90 mL/min (ref >90)
Glucose, Bld: 91 mg/dL (ref 70–99)
Glucose, Bld: 95 mg/dL (ref 70–99)
Potassium: 4 mEq/L (ref 3.5–5.1)
Potassium: 4.3 mEq/L (ref 3.5–5.1)
Sodium: 141 mEq/L (ref 135–145)
Sodium: 137 mEq/L (ref 135–145)
Total Bilirubin: 0.7 mg/dL (ref 0.3–1.2)
Total Bilirubin: 1.1 mg/dL (ref 0.3–1.2)
Total Protein: 6.7 g/dL (ref 6.0–8.3)
Total Protein: 6.7 g/dL (ref 6.0–8.3)

## 2012-08-24 LAB — CBC
HCT: 35 % — ABNORMAL LOW (ref 39.0–52.0)
Hemoglobin: 11.7 g/dL — ABNORMAL LOW (ref 13.0–17.0)
MCH: 26.1 pg (ref 26.0–34.0)
MCHC: 33.4 g/dL (ref 30.0–36.0)
MCV: 78.1 fL (ref 78.0–100.0)
Platelets: 127 10*3/uL — ABNORMAL LOW (ref 150–400)
RBC: 4.48 MIL/uL (ref 4.22–5.81)
RDW: 15.8 % — ABNORMAL HIGH (ref 11.5–15.5)
WBC: 3.8 10*3/uL — ABNORMAL LOW (ref 4.0–10.5)

## 2012-08-24 LAB — ETHANOL: Alcohol, Ethyl (B): 98 mg/dL — ABNORMAL HIGH (ref 0–11)

## 2012-08-24 MED ORDER — LORAZEPAM 2 MG/ML IJ SOLN
1.0000 mg | Freq: Once | INTRAMUSCULAR | Status: AC
  Administered 2012-08-24: 1 mg via INTRAVENOUS

## 2012-08-24 MED ORDER — SODIUM CHLORIDE 0.9 % IV SOLN
1000.0000 mL | INTRAVENOUS | Status: DC
  Administered 2012-08-24: 1000 mL via INTRAVENOUS

## 2012-08-24 MED ORDER — ADULT MULTIVITAMIN W/MINERALS CH
1.0000 | ORAL_TABLET | Freq: Every day | ORAL | Status: DC
  Administered 2012-08-24 – 2012-08-25 (×2): 1 via ORAL
  Filled 2012-08-24 (×2): qty 1

## 2012-08-24 MED ORDER — NICOTINE 14 MG/24HR TD PT24
14.0000 mg | MEDICATED_PATCH | Freq: Every day | TRANSDERMAL | Status: DC
  Administered 2012-08-24 – 2012-08-25 (×2): 14 mg via TRANSDERMAL
  Filled 2012-08-24 (×2): qty 1

## 2012-08-24 MED ORDER — LORAZEPAM 2 MG/ML IJ SOLN
2.0000 mg | Freq: Once | INTRAMUSCULAR | Status: AC
  Administered 2012-08-24: 2 mg via INTRAVENOUS
  Filled 2012-08-24: qty 1

## 2012-08-24 MED ORDER — HYDRALAZINE HCL 20 MG/ML IJ SOLN
5.0000 mg | Freq: Four times a day (QID) | INTRAMUSCULAR | Status: DC | PRN
  Administered 2012-08-25: 5 mg via INTRAVENOUS
  Filled 2012-08-24: qty 1

## 2012-08-24 MED ORDER — HEPARIN SODIUM (PORCINE) 5000 UNIT/ML IJ SOLN
5000.0000 [IU] | Freq: Three times a day (TID) | INTRAMUSCULAR | Status: DC
  Administered 2012-08-24 – 2012-08-25 (×4): 5000 [IU] via SUBCUTANEOUS
  Filled 2012-08-24 (×7): qty 1

## 2012-08-24 MED ORDER — SODIUM CHLORIDE 0.9 % IV SOLN
1000.0000 mL | Freq: Once | INTRAVENOUS | Status: AC
  Administered 2012-08-24: 1000 mL via INTRAVENOUS

## 2012-08-24 MED ORDER — TESTOSTERONE 50 MG/5GM (1%) TD GEL
5.0000 g | Freq: Every day | TRANSDERMAL | Status: DC
  Administered 2012-08-24 – 2012-08-25 (×2): 5 g via TRANSDERMAL
  Filled 2012-08-24 (×2): qty 5

## 2012-08-24 NOTE — Progress Notes (Signed)
[facility] PROGRESS NOTE  Leonard Bentley UXL:[id number] DOB: 1961-01-20 DOA: 08/23/2012 PCP: Carollee Herter, MD  Assessment/Plan: 1. Alcohol withdrawal--no evidence of delirium tremens. Improved with 5 mg Ativan last 12 hours. Continue CIWA. Interested in quitting. Consult CSW for treatment options. 2. Alcohol abuse--plan as above 3. Mild transaminitis--presumed secondary to alcohol use. Hepatitis panel and HIV negative 08/17/2011. 4. Cigarette smoker--nicotine patch. I recommended cessation.  Code Status: full code Family Communication: none Disposition Plan: home when improved, likely 10/18  Brendia Sacks, MD  [facility] Team 6 Pager [phone]. If 8PM-8AM, please contact night-coverage at [email or web address], password [facility] 08/24/2012, 11:03 AM  LOS: 1 Leonard Bentley   Brief narrative: 51 year old man PMH alcohol abuse 1/5 liquor/[place] presented to ED approximately 12 hours after last alcoholic drink with complaints of n/v. Admitted for alcohol withdrawal.  Consultants:    Procedures:    HPI/Subjective: Feels better, less tremulous. No n/v.   Objective: Filed Vitals:   08/24/12 0430 08/24/12 0500 08/24/12 0525 08/24/12 0949  BP: 137/116 137/95 152/94 145/94  Pulse: 75 74 77 73  Temp:   98.5 F (36.9 C) 98.4 F (36.9 C)  TempSrc:   Oral Oral  Resp: 21 22 20 20   Height:   5\' 10"  (1.778 m)   Weight:   83.689 kg (184 lb 8 oz)   SpO2: 95% 96% 96% 98%    Intake/Output Summary (Last 24 hours) at 08/24/12 1103 Last data filed at 08/24/12 0829  Gross per 24 hour  Intake    240 ml  Output      0 ml  Net    240 ml   Filed Weights   08/24/12 0525  Weight: 83.689 kg (184 lb 8 oz)    Exam:  General:  Appears calm and comfortable Cardiovascular: RRR, no m/r/g. No LE edema. Respiratory: CTA bilaterally, no w/r/r. Normal respiratory effort. Psychiatric: grossly normal mood and affect, speech fluent and appropriate, mild tremors BUE noted  Data Reviewed: Basic  Metabolic Panel:  Lab 08/24/12 3474 08/24/12 0045  NA 137 141  K 4.3 4.0  CL 96 97  CO2 29 25  GLUCOSE 95 91  BUN 11 11  CREATININE 0.90 0.95  CALCIUM 8.4 8.6  MG -- --  PHOS -- --   Liver Function Tests:  Lab 08/24/12 0618 08/24/12 0045  AST 158* 103*  ALT 77* 63*  ALKPHOS 45 48  BILITOT 1.1 0.7  PROT 6.7 6.7  ALBUMIN 3.7 4.0   CBC:  Lab 08/24/12 0618 08/24/12 0045  WBC 3.8* 4.4  NEUTROABS -- 3.6  HGB 11.7* 12.3*  HCT 35.0* 36.9*  MCV 78.1 77.7*  PLT 127* 135*   Studies: Dg Chest 2 View  08/24/2012  *RADIOLOGY REPORT*  Clinical Data: Delirium tremens. History of lymphoma.  CHEST - 2 VIEW  Comparison: Chest CT of 06/30/2012 and PET of 01/04/2012.  No prior plain films.  Findings: Mild hyperinflation. Lateral view degraded by patient arm position.  Midline trachea.  Normal heart size and mediastinal contours. No pleural effusion or pneumothorax.  Clear lungs.  IMPRESSION: No acute cardiopulmonary disease.   Original Report Authenticated By: Consuello Bossier, M.D.     Scheduled Meds:   . sodium chloride  1,000 mL Intravenous Once  . folic acid  1 mg Oral Daily  . heparin  5,000 Units Subcutaneous Q8H  . LORazepam  1 mg Intravenous Once  . LORazepam  2 mg Intravenous Once  . multivitamin with minerals  1 tablet Oral Daily  .  nicotine  14 mg Transdermal Daily  . ondansetron      . testosterone  5 g Transdermal Daily  . thiamine  100 mg Oral Daily   Or  . thiamine  100 mg Intravenous Daily  . DISCONTD: multivitamin with minerals  1 tablet Oral Daily   Continuous Infusions:   . sodium chloride 1,000 mL (08/24/12 0433)    Principal Problem:  *Alcohol withdrawal Active Problems:  Alcohol abuse  Transaminitis  Cigarette smoker     Brendia Sacks, MD  [facility] Team 6 Pager [phone]. If 8PM-8AM, please contact night-coverage at [email or web address], password [facility] 08/24/2012, 11:03 AM  LOS: 1 Leonard Bentley   Time spent: 20 minutes

## 2012-08-24 NOTE — ED Provider Notes (Signed)
History     CSN: [id number]  Arrival date & time 08/23/12  2253   First MD Initiated Contact with Patient 08/24/12 0002      Chief Complaint  Patient presents with  . Delirium Tremens (DTS)   HPI Pt's last drink was about 12pm.  He has been trying to cut back.  He started feeling poorly later in the [place] today.  He developed nausea and vomiting and started feeling shaky.  Pt drinks a 5th per [place] for at least 3-4 years.  No seizures in the past.  He has had shakes in the past when he tries to stop drinking.  Today was worse.  He also felt lightheaded.  He felt short of breath earlier as well but that has resolved.  No abdominal pain.  NO headaches.  No blood in stool or his emesis.  He has been having some issues with depression but denies any suicidal ideation Past Medical History  Diagnosis Date  . ED (erectile dysfunction)   . Cancer 07/2011    STAGE II NON- HODGKIN LYMPHOMA  . Lymphoma, non Hodgkin's 09/25/2011    History reviewed. No pertinent past surgical history.  Family History  Problem Relation Age of Onset  . Cancer Mother     BREAST(BONE)  . Cancer Father     PANCREATIC    History  Substance Use Topics  . Smoking status: Current Every [place] Smoker -- 1.0 packs/[name] for 32 years    Types: Cigarettes  . Smokeless tobacco: Never Used  . Alcohol Use: Not on file      Review of Systems  All other systems reviewed and are negative.    Allergies  Review of patient's allergies indicates no known allergies.  Home Medications   Current Outpatient Rx  Name Route Sig Dispense Refill  . IBUPROFEN 200 MG PO TABS Oral Take 400 mg by mouth every 8 (eight) hours as needed. For pain    . ADULT MULTIVITAMIN W/MINERALS CH Oral Take 1 tablet by mouth daily.    . TESTOSTERONE 50 MG/5GM TD GEL Transdermal Place 5 g onto the skin daily.    Marland Kitchen GABAPENTIN 300 MG PO CAPS Oral Take 1 capsule (300 mg total) by mouth 3 (three) times daily. 90 capsule 0    BP 102/80  Pulse 66   Temp 97.6 F (36.4 C)  Resp 24  Ht 5\' 10"  (1.778 m)  SpO2 100%  Physical Exam  Nursing note and vitals reviewed. Constitutional: He appears well-developed and well-nourished. No distress.  HENT:  Head: Normocephalic and atraumatic.  Right Ear: External ear normal.  Left Ear: External ear normal.  Eyes: Conjunctivae normal are normal. Right eye exhibits no discharge. Left eye exhibits no discharge. No scleral icterus.  Neck: Neck supple. No tracheal deviation present.  Cardiovascular: Normal rate, regular rhythm and intact distal pulses.   Pulmonary/Chest: Effort normal and breath sounds normal. No stridor. No respiratory distress. He has no wheezes. He has no rales.  Abdominal: Soft. Bowel sounds are normal. He exhibits no distension. There is no tenderness. There is no rebound and no guarding.  Musculoskeletal: He exhibits no edema and no tenderness.  Neurological: He is alert. He has normal strength. He displays tremor. No sensory deficit. Cranial nerve deficit:  no gross defecits noted. He exhibits normal muscle tone. He displays no seizure activity (moderate to severe in all extremities). Coordination normal.  Skin: Skin is warm and dry. No rash noted.  Psychiatric: He has a normal mood  and affect.    ED Course  Procedures (including critical care time)  Medications  ondansetron (ZOFRAN) 4 MG/2ML injection (not administered)  Multiple Vitamin (MULTIVITAMIN WITH MINERALS) TABS (not administered)  ibuprofen (ADVIL,MOTRIN) 200 MG tablet (not administered)  LORazepam (ATIVAN) tablet 1 mg (  Oral See Alternative 08/23/12 2328)    Or  LORazepam (ATIVAN) injection 1 mg (1 mg Intravenous Given 08/23/12 2328)  thiamine (VITAMIN B-1) tablet 100 mg (100 mg Oral Given 08/24/12 0116)    Or  thiamine (B-1) injection 100 mg (  Intravenous See Alternative 08/24/12 0116)  folic acid (FOLVITE) tablet 1 mg (1 mg Oral Given 08/24/12 0116)  multivitamin with minerals tablet 1 tablet (not  administered)  0.9 %  sodium chloride infusion (1000 mL Intravenous New Bag/Given 08/24/12 0124)    Followed by  0.9 %  sodium chloride infusion (not administered)  LORazepam (ATIVAN) injection 2 mg (not administered)  LORazepam (ATIVAN) injection 1 mg (1 mg Intravenous Given 08/24/12 0123)    Labs Reviewed  CBC WITH DIFFERENTIAL - Abnormal; Notable for the following:    Hemoglobin 12.3 (*)     HCT 36.9 (*)     MCV 77.7 (*)     MCH 25.9 (*)     RDW 15.6 (*)     Platelets 135 (*)     Neutrophils Relative 81 (*)     Lymphocytes Relative 8 (*)     Lymphs Abs 0.4 (*)     All other components within normal limits  COMPREHENSIVE METABOLIC PANEL - Abnormal; Notable for the following:    AST 103 (*)     ALT 63 (*)     All other components within normal limits  ETHANOL - Abnormal; Notable for the following:    Alcohol, Ethyl (B) 98 (*)     All other components within normal limits  URINE RAPID DRUG SCREEN (HOSP PERFORMED)  DRUG SCREEN, URINE   Dg Chest 2 View  08/24/2012  *RADIOLOGY REPORT*  Clinical Data: Delirium tremens. History of lymphoma.  CHEST - 2 VIEW  Comparison: Chest CT of 06/30/2012 and PET of 01/04/2012.  No prior plain films.  Findings: Mild hyperinflation. Lateral view degraded by patient arm position.  Midline trachea.  Normal heart size and mediastinal contours. No pleural effusion or pneumothorax.  Clear lungs.  IMPRESSION: No acute cardiopulmonary disease.   Original Report Authenticated By: Consuello Bossier, M.D.      1. Alcohol withdrawal       MDM  The patient has been given several doses of Ativan IV. He has noted some improvement but at the bedside he is mildly tachycardic with a heart rate in the low 100s.  The patient is also very tremulous. Considering his several doses of benzodiazepine and persistent tremor associated with mild tachycardia I feel that he would be best monitored overnight in an inpatient setting for his alcohol withdrawal. Patient is  interested in detox and additional treatment for his alcohol abuse.  At this time he remained stable. There is no evidence of delirium tremens       Celene Kras, MD 08/24/12 0200

## 2012-08-24 NOTE — ED Notes (Signed)
Report given to [facility] on 5th Floor

## 2012-08-24 NOTE — H&P (Signed)
[facility] History and Physical  Leonard Bentley AVW:[id number] DOB: 1961/10/08 DOA: 08/23/2012  Referring physician: ED PCP: Leonard Herter, MD  Specialists: None  Chief Complaint: Delirium Tremens  HPI: Leonard Bentley is a 51 y.o. male who has made the decision to try and quit drinking EtOH.  He drinks about a 5th per [place] for the past 3-4 years, no seizures in the past but has had shakes in the past when he tried to stop drinking or cut down on drinking.  He says he stopped drinking at noon yesterday.  Today his symptoms were severe, associated with lightheadedness.  No abdominal pain, no headaches, no bloody stool or emesis.  Because of his symptoms he presented to the ED where a diagnosis of EtOH withdrawal was confirmed, this despite the patient still having a BAL of 98.  Hospitalist service has been asked to admit the patient for CIWA and detox especially in light of the fact that his already bad withdrawal symptoms are likely to get worse in the coming days.  Review of Systems: 12 systems reviewed and otherwise negative.  Past Medical History  Diagnosis Date  . ED (erectile dysfunction)   . Cancer 07/2011    STAGE II NON- HODGKIN LYMPHOMA  . Lymphoma, non Hodgkin's 09/25/2011   History reviewed. No pertinent past surgical history. Social History:  reports that he has been smoking Cigarettes.  He has a 32 pack-year smoking history. He has never used smokeless tobacco. He reports that he drinks alcohol. He reports that he does not use illicit drugs. 5th a [place] of hard liquor.  No Known Allergies  Family History  Problem Relation Age of Onset  . Cancer Mother     BREAST(BONE)  . Cancer Father     PANCREATIC   Prior to Admission medications   Medication Sig Start Date End Date Taking? Authorizing Provider  ibuprofen (ADVIL,MOTRIN) 200 MG tablet Take 400 mg by mouth every 8 (eight) hours as needed. For pain   Yes Historical Provider, MD  Multiple Vitamin (MULTIVITAMIN  WITH MINERALS) TABS Take 1 tablet by mouth daily.   Yes Historical Provider, MD  testosterone (ANDROGEL) 50 MG/5GM GEL Place 5 g onto the skin daily.   Yes Historical Provider, MD  gabapentin (NEURONTIN) 300 MG capsule Take 1 capsule (300 mg total) by mouth 3 (three) times daily. 08/10/12   Conni Slipper, PA   Physical Exam: Filed Vitals:   08/23/12 2330 08/24/12 0035 08/24/12 0230 08/24/12 0300  BP: 117/71 130/81 117/87 122/80  Pulse: 79 78 93 96  Temp:      Resp: 12  22 22   Height:      SpO2: 100% 100% 97% 97%     General:  NAD, resting comfortably  Eyes: PEERLA EOMI  ENT: mucous membranes moist  Neck: supple w/o JVD  Cardiovascular: RRR w/o MRG  Respiratory: CTA B  Abdomen: soft, nt, nd, bs+  Skin: w/o lesion or rash  Musculoskeletal: MAE full ROM all 4 extremities  Psychiatric: normal mood and affect  Neurologic: AAOx3, diffuse tremors noted on exam  Labs on Admission:  Basic Metabolic Panel:  Lab 08/24/12 8295  NA 141  K 4.0  CL 97  CO2 25  GLUCOSE 91  BUN 11  CREATININE 0.95  CALCIUM 8.6  MG --  PHOS --   Liver Function Tests:  Lab 08/24/12 0045  AST 103*  ALT 63*  ALKPHOS 48  BILITOT 0.7  PROT 6.7  ALBUMIN 4.0  No results found for this basename: LIPASE:5,AMYLASE:5 in the last 168 hours No results found for this basename: AMMONIA:5 in the last 168 hours CBC:  Lab 08/24/12 0045  WBC 4.4  NEUTROABS 3.6  HGB 12.3*  HCT 36.9*  MCV 77.7*  PLT 135*   Cardiac Enzymes: No results found for this basename: CKTOTAL:5,CKMB:5,CKMBINDEX:5,TROPONINI:5 in the last 168 hours  BNP (last 3 results) No results found for this basename: PROBNP:3 in the last 8760 hours CBG: No results found for this basename: GLUCAP:5 in the last 168 hours  Radiological Exams on Admission: Dg Chest 2 View  08/24/2012  *RADIOLOGY REPORT*  Clinical Data: Delirium tremens. History of lymphoma.  CHEST - 2 VIEW  Comparison: Chest CT of 06/30/2012 and PET of  01/04/2012.  No prior plain films.  Findings: Mild hyperinflation. Lateral view degraded by patient arm position.  Midline trachea.  Normal heart size and mediastinal contours. No pleural effusion or pneumothorax.  Clear lungs.  IMPRESSION: No acute cardiopulmonary disease.   Original Report Authenticated By: Consuello Bossier, M.D.     EKG: Independently reviewed.  Assessment/Plan Principal Problem:  *Delirium tremens   1. Delirium Tremens - plan to admit patient for Detox, CIWA per protocol, thiamine, folate, MV per protocol, ativan for symptoms. 2. Mild transaminitis - monitor with AM CMP 3. Stage 2 NHL - in remission last dose chemo 12/01/11 with complete response, as of 8/27 office visit no evidence of recurrence.  Code Status: Full Code Family Communication: No family in room Disposition Plan: Admit to inpatient, likely to need several days to complete detox  Time spent: 30 min  [name] M. [facility] Pager [phone]  If 7PM-7AM, please contact night-coverage [email or web address] Password [email or web address] 08/24/2012, 3:47 AM

## 2012-08-24 NOTE — ED Notes (Signed)
Pt. States that his usual consumption of alcohol is usually 1 fifth of gin per [place]eo.  Pt last drink was 1000 am 08/23/2012

## 2012-08-25 MED ORDER — HYDRALAZINE HCL 20 MG/ML IJ SOLN: 10.0000 mg | Freq: Four times a day (QID) | INTRAMUSCULAR | Status: DC | PRN

## 2012-08-25 MED ORDER — HYDRALAZINE HCL 20 MG/ML IJ SOLN
INTRAMUSCULAR | Status: AC
  Filled 2012-08-25: qty 1

## 2012-08-25 MED ORDER — HYDRALAZINE HCL 20 MG/ML IJ SOLN
5.0000 mg | Freq: Once | INTRAMUSCULAR | Status: AC
  Administered 2012-08-25: 5 mg via INTRAVENOUS

## 2012-08-25 NOTE — Progress Notes (Signed)
Clinical Social Work Department BRIEF PSYCHOSOCIAL ASSESSMENT 08/25/2012  Patient:  Vazguez,Zaydn A     Account Number:  [id number]     Admit date:  08/23/2012  Clinical Social Worker:  Doroteo Glassman  Date/Time:  08/25/2012 10:54 AM  Referred by:  Physician  Date Referred:  08/25/2012 Referred for  Substance Abuse   Other Referral:   Interview type:  Patient Other interview type:    PSYCHOSOCIAL DATA Living Status:  ALONE Admitted from facility:   Level of care:   Primary support name:  Natasha Primary support relationship to patient:  CHILD, ADULT Degree of support available:   adequate    CURRENT CONCERNS Current Concerns  Substance Abuse   Other Concerns:    SOCIAL WORK ASSESSMENT / PLAN Pt reports that he has been drinking a fifth daily for approx 3-4 years and that he decided to abruptly stop drinking prior to admission.    Pt states that he has attended AA meeting by hx but has never received treatment in any form.    Pt reports that he has been employed at [employer] for 25 years and is highly respected in his field, although he minimizes this.  He states that, at his recent performance review, he was upset that his employer rated his quality as "average."  Pt states that he takes his work seriously and felt that this was an unfair assessment.    Pt lights up when talking about his daughter, Marcelle Smiling, who lives in [place] with her 2 children.  Pt is touched that she would drive up here to be with him during this time.    Pt has Non-Hodgkins Lymphoma and was informed in August that he is in remission.  While this is good news for Pt, he is struggling with the financially due to medical bills related to his cancer treatment.    Pt intends to return to AA.  CSW provided Pt with numerous outpt SA information and provided Pt with IOP resources.    CSW thanked Pt for his time.   Assessment/plan status:  No Further Intervention Required Other assessment/  plan:   Information/referral to community resources:   IOP information, outpt SA information, outpt mental health information    PATIENT'S/FAMILY'S RESPONSE TO PLAN OF CARE: Pt thanked CSW for time and assistance.  CSW to sign off.  Providence Crosby, LCSWA Clinical Social Work [phone]

## 2012-08-25 NOTE — Discharge Summary (Signed)
Physician Discharge Summary  Brennden Masten Nokes ZOX:[id number] DOB: January 31, 1961 DOA: 08/23/2012  PCP: Carollee Herter, MD Oncologist: Si Gaul, MD  Admit date: 08/23/2012 Discharge date: 08/25/2012  Recommendations for Outpatient Follow-up:  1. Continue to encourage abstinence from alcohol. 2. Encourage cessation from smoking 3. Consider repeat CBC, LFTs to follow-up mild leukopenia and thrombocytopenia, elevated AST/ALT. Defer to PCP. (include homehealth, outpatient follow-up instructions, specific recommendations for PCP to follow-up on, etc.)  Follow-up Information    Follow up with Carollee Herter, MD. In 1 week.   Contact information:   [address] [place] [address] [phone]         Discharge Diagnoses:  1. Alcohol withdrawal 2. Alcohol abuse 3. Mild transaminitis 4. Cigarette smoker 5. History of stage II large B-cell non-Hodgkin lymphoma   Discharge Condition: improved Disposition: home  Diet recommendation: regular  Filed Weights   08/24/12 0525  Weight: 83.689 kg (184 lb 8 oz)    History of present illness:  51 year old man PMH alcohol abuse 1/5 liquor/[place]ho presented to ED approximately 12 hours after last alcoholic drink with complaints of n/v. Admitted for alcohol withdrawal.  Hospital Course:  Mr. Favor was treated with CIWA/Ativan for alcohol withdrawal. There was no evidence of DTs and he displays no symptoms of alcohol withdrawal now. He has good insight into his abuse of alcohol and is committed to quitting. He was seen by social work and provided with outpatient resources to assist in his determination to remain abstinent.  1. Alcohol withdrawal--resolved. No evidence of delirium tremens. CSW provided treatment options. 2. Alcohol abuse--plan as above, committed to stopping drinking.  3. Mild transaminitis--presumed secondary to alcohol use. Hepatitis panel and HIV negative 08/17/2011.  4. Cigarette smoker--nicotine patch. I  recommended cessation.  5. History of stage II large B-cell non-Hodgkin lymphoma   Suspect mild leukopenia and thrombocytopenia secondary to alcohol abuse. There was no evidence of disease recurrence as of 07/04/3012 office visit.  Consultants:  CSW  Procedures:  None  Discharge Instructions  Discharge Orders    Future Appointments: Provider: Department: Dept Phone: Center:   12/28/2012 8:00 AM [facility] [phone] None   12/28/2012 9:00 AM [facility] [phone] [place]   01/01/2013 9:00 AM Si Gaul, MD [facility] [phone] None     Future Orders Please Complete By Expires   Diet general      Discharge instructions      Comments:   Congratulations on your committment to stopping alcohol use. Please follow-up with resources provided by Child psychotherapist. Call physician or seek immediate medical attention for hallucinations, tremors or worsening of condition.   Activity as tolerated - No restrictions          Medication List     As of 08/25/2012 11:04 AM    TAKE these medications         gabapentin 300 MG capsule   Commonly known as: NEURONTIN   Take 1 capsule (300 mg total) by mouth 3 (three) times daily.      ibuprofen 200 MG tablet   Commonly known as: ADVIL,MOTRIN   Take 400 mg by mouth every 8 (eight) hours as needed. For pain      multivitamin with minerals Tabs   Take 1 tablet by mouth daily.      testosterone 50 MG/5GM Gel   Commonly known as: ANDROGEL   Place 5 g onto the skin daily.       The results of significant diagnostics  from this hospitalization (including imaging, microbiology, ancillary and laboratory) are listed below for reference.    Significant Diagnostic Studies: Dg Chest 2 View  08/24/2012  *RADIOLOGY REPORT*  Clinical Data: Delirium tremens. History of lymphoma.  CHEST - 2 VIEW  Comparison: Chest CT of 06/30/2012 and PET of 01/04/2012.  No prior plain films.  Findings: Mild  hyperinflation. Lateral view degraded by patient arm position.  Midline trachea.  Normal heart size and mediastinal contours. No pleural effusion or pneumothorax.  Clear lungs.  IMPRESSION: No acute cardiopulmonary disease.   Original Report Authenticated By: Consuello Bossier, M.D.    Labs: Basic Metabolic Panel:  Lab 08/24/12 5784 08/24/12 0045  NA 137 141  K 4.3 4.0  CL 96 97  CO2 29 25  GLUCOSE 95 91  BUN 11 11  CREATININE 0.90 0.95  CALCIUM 8.4 8.6  MG -- --  PHOS -- --   Liver Function Tests:  Lab 08/24/12 0618 08/24/12 0045  AST 158* 103*  ALT 77* 63*  ALKPHOS 45 48  BILITOT 1.1 0.7  PROT 6.7 6.7  ALBUMIN 3.7 4.0   CBC:  Lab 08/24/12 0618 08/24/12 0045  WBC 3.8* 4.4  NEUTROABS -- 3.6  HGB 11.7* 12.3*  HCT 35.0* 36.9*  MCV 78.1 77.7*  PLT 127* 135*   Principal Problem:  *Alcohol withdrawal Active Problems:  Alcohol abuse  Transaminitis  Cigarette smoker   Time coordinating discharge: 20 minutes  Signed:  Brendia Sacks, MD [facility] 08/25/2012, 11:04 AM

## 2012-08-25 NOTE — Progress Notes (Signed)
[facility] PROGRESS NOTE  Leonard Bentley WUJ:[id number] DOB: [birth year] DOA: 08/23/2012 PCP: Carollee Herter, MD Oncologist: Si Gaul, MD  Assessment/Plan: 1. Alcohol withdrawal--resolved. No evidence of delirium tremens. CSW provided treatment options. 2. Alcohol abuse--plan as above, committed to stopping drinking. 3. Mild transaminitis--presumed secondary to alcohol use. Hepatitis panel and HIV negative 08/17/2011. 4. Cigarette smoker--nicotine patch. I recommended cessation. 5. History of stage II large B-cell non-Hodgkin lymphoma   Suspect mild leukopenia and thrombocytopenia secondary to alcohol abuse. There was no evidence of disease recurrence as of 07/04/3012 office visit.  Code Status: full code Family Communication: none Disposition Plan: home today  Brendia Sacks, MD  [facility] Team 6 Pager [phone]. If 8PM-8AM, please contact night-coverage at [email or web address], password [facility] 08/25/2012, 10:57 AM  LOS: 2 days   Brief narrative: 51 year old man PMH alcohol abuse 1/5 liquor/[place] presented to ED approximately 12 hours after last alcoholic drink with complaints of n/v. Admitted for alcohol withdrawal.  Consultants:  CSW  Procedures:    HPI/Subjective: Feels well, ready to go home.  Objective: Filed Vitals:   08/25/12 0035 08/25/12 0329 08/25/12 0428 08/25/12 0538  BP: 141/96 152/116 139/81 129/93  Pulse:  0 91 79  Temp:    98.1 F (36.7 C)  TempSrc:    Oral  Resp:    20  Height:      Weight:      SpO2:    99%    Intake/Output Summary (Last 24 hours) at 08/25/12 1057 Last data filed at 08/24/12 1200  Gross per 24 hour  Intake    762 ml  Output      0 ml  Net    762 ml   Filed Weights   08/24/12 0525  Weight: 83.689 kg (184 lb 8 oz)    Exam:  General:  Appears calm and comfortable Cardiovascular: RRR, no m/r/g. No LE edema. Respiratory: CTA bilaterally, no w/r/r. Normal respiratory effort. Psychiatric: grossly normal mood  and affect, speech fluent and appropriate  Data Reviewed: Basic Metabolic Panel:  Lab 08/24/12 8657 08/24/12 0045  NA 137 141  K 4.3 4.0  CL 96 97  CO2 29 25  GLUCOSE 95 91  BUN 11 11  CREATININE 0.90 0.95  CALCIUM 8.4 8.6  MG -- --  PHOS -- --   Liver Function Tests:  Lab 08/24/12 0618 08/24/12 0045  AST 158* 103*  ALT 77* 63*  ALKPHOS 45 48  BILITOT 1.1 0.7  PROT 6.7 6.7  ALBUMIN 3.7 4.0   CBC:  Lab 08/24/12 0618 08/24/12 0045  WBC 3.8* 4.4  NEUTROABS -- 3.6  HGB 11.7* 12.3*  HCT 35.0* 36.9*  MCV 78.1 77.7*  PLT 127* 135*   Studies: Dg Chest 2 View  08/24/2012  *RADIOLOGY REPORT*  Clinical Data: Delirium tremens. History of lymphoma.  CHEST - 2 VIEW  Comparison: Chest CT of 06/30/2012 and PET of 01/04/2012.  No prior plain films.  Findings: Mild hyperinflation. Lateral view degraded by patient arm position.  Midline trachea.  Normal heart size and mediastinal contours. No pleural effusion or pneumothorax.  Clear lungs.  IMPRESSION: No acute cardiopulmonary disease.   Original Report Authenticated By: Consuello Bossier, M.D.     Scheduled Meds:    . folic acid  1 mg Oral Daily  . heparin  5,000 Units Subcutaneous Q8H  . hydrALAZINE      . hydrALAZINE  5 mg Intravenous Once  . multivitamin with minerals  1 tablet Oral Daily  .  nicotine  14 mg Transdermal Daily  . ondansetron      . testosterone  5 g Transdermal Daily  . thiamine  100 mg Oral Daily   Or  . thiamine  100 mg Intravenous Daily   Continuous Infusions:    . DISCONTD: sodium chloride 1,000 mL (08/24/12 0433)    Principal Problem:  *Alcohol withdrawal Active Problems:  Alcohol abuse  Transaminitis  Cigarette smoker     Brendia Sacks, MD  [facility] Team 6 Pager [phone]. If 8PM-8AM, please contact night-coverage at [email or web address], password [facility] 08/25/2012, 10:57 AM  LOS: 2 days

## 2012-08-31 ENCOUNTER — Inpatient Hospital Stay: Payer: BC Managed Care – PPO | Admitting: Family Medicine

## 2012-09-08 ENCOUNTER — Ambulatory Visit (INDEPENDENT_AMBULATORY_CARE_PROVIDER_SITE_OTHER): Payer: BC Managed Care – PPO | Admitting: Family Medicine

## 2012-09-08 ENCOUNTER — Encounter: Payer: Self-pay | Admitting: Family Medicine

## 2012-09-08 VITALS — BP 122/72 | HR 90 | Wt 178.0 lb

## 2012-09-08 DIAGNOSIS — C8590 Non-Hodgkin lymphoma, unspecified, unspecified site: Secondary | ICD-10-CM

## 2012-09-08 DIAGNOSIS — IMO0002 Reserved for concepts with insufficient information to code with codable children: Secondary | ICD-10-CM

## 2012-09-08 DIAGNOSIS — G622 Polyneuropathy due to other toxic agents: Secondary | ICD-10-CM

## 2012-09-08 DIAGNOSIS — F1010 Alcohol abuse, uncomplicated: Secondary | ICD-10-CM

## 2012-09-08 DIAGNOSIS — C8589 Other specified types of non-Hodgkin lymphoma, extranodal and solid organ sites: Secondary | ICD-10-CM

## 2012-09-08 NOTE — Progress Notes (Signed)
  Subjective:    Patient ID: Leonard Bentley, male    DOB: [birth year], 51 y.o.   MRN: [id number]  HPI He is here for consultation after recent emergency room visit for alcohol withdrawal. The ER record was reviewed. He has a long history of drinking wine and recently switching to liquor. This started prior to his diagnosis of a non-Hodgkin's lymphoma but did get worse after this. Presently he is planning in getting involved in a plans to go to his first meeting Sunday. He continues to drink. He has been in counseling [facility] Pacini he plans to check up another counseling service at will be free. He also brought in a less of a least 12 things that he would like to discuss. He is on Neurontin for his peripheral neuropathy secondary to his chemotherapy. He also asked for something for sleep. He admits to using alcohol to help with sleep. He did have a lesion present on his back that he would like me to look at.   Review of Systems     Objective:   Physical Exam Alert and in no distress. Exam of his upper back showed a 1 cm subcutaneous nodule that is quite benign.       Assessment & Plan:   1. Peripheral neuropathy, secondary to drugs or chemicals   2. Alcohol abuse   3. Stage IIB non Hodgkin's lymphoma    at the end of the encounter he then asked that I fill out an FMLA. He states he needs it because sometimes he misses work due to anxiety. I explained that I could not fill it out at this time because it did not have enough information. Discussed the fact that he needs to start to taper off of alcohol and not just abruptly stop. He will check into counseling. Recommend he return here in 2 weeks. Discussed possibly referring him to a psychiatrist especially if we need intervention with antidepressants etc. I explained that I did not feel comfortable giving him an antidepressant, antianxiety or sleep meds since he continues to drink. Sleep hygiene information given.

## 2012-09-08 NOTE — Patient Instructions (Addendum)
Insomnia Insomnia is frequent trouble falling and/or staying asleep. Insomnia can be a long term problem or a short term problem. Both are common. Insomnia can be a short term problem when the wakefulness is related to a certain stress or worry. Long term insomnia is often related to ongoing stress during waking hours and/or poor sleeping habits. Overtime, sleep deprivation itself can make the problem worse. Every little thing feels more severe because you are overtired and your ability to cope is decreased. CAUSES   Stress, anxiety, and depression.  Poor sleeping habits.  Distractions such as TV in the bedroom.  Naps close to bedtime.  Engaging in emotionally charged conversations before bed.  Technical reading before sleep.  Alcohol and other sedatives. They may make the problem worse. They can hurt normal sleep patterns and normal dream activity.  Stimulants such as caffeine for several hours prior to bedtime.  Pain syndromes and shortness of breath can cause insomnia.  Exercise late at night.  Changing time zones may cause sleeping problems (jet lag). It is sometimes helpful to have someone observe your sleeping patterns. They should look for periods of not breathing during the night (sleep apnea). They should also look to see how long those periods last. If you live alone or observers are uncertain, you can also be observed at a sleep clinic where your sleep patterns will be professionally monitored. Sleep apnea requires a checkup and treatment. Give your caregivers your medical history. Give your caregivers observations your family has made about your sleep.  SYMPTOMS   Not feeling rested in the morning.  Anxiety and restlessness at bedtime.  Difficulty falling and staying asleep. TREATMENT   Your caregiver may prescribe treatment for an underlying medical disorders. Your caregiver can give advice or help if you are using alcohol or other drugs for self-medication. Treatment  of underlying problems will usually eliminate insomnia problems.  Medications can be prescribed for short time use. They are generally not recommended for lengthy use.  Over-the-counter sleep medicines are not recommended for lengthy use. They can be habit forming.  You can promote easier sleeping by making lifestyle changes such as:  Using relaxation techniques that help with breathing and reduce muscle tension.  Exercising earlier in the [place].  Changing your diet and the time of your last meal. No night time snacks.  Establish a regular time to go to bed.  Counseling can help with stressful problems and worry.  Soothing music and white noise may be helpful if there are background noises you cannot remove.  Stop tedious detailed work at least one hour before bedtime. HOME CARE INSTRUCTIONS   Keep a diary. Inform your caregiver about your progress. This includes any medication side effects. See your caregiver regularly. Take note of:  Times when you are asleep.  Times when you are awake during the night.  The quality of your sleep.  How you feel the next [place]. This information will help your caregiver care for you.  Get out of bed if you are still awake after 15 minutes. Read or do some quiet activity. Keep the lights down. Wait until you feel sleepy and go back to bed.  Keep regular sleeping and waking hours. Avoid naps.  Exercise regularly.  Avoid distractions at bedtime. Distractions include watching television or engaging in any intense or detailed activity like attempting to balance the household checkbook.  Develop a bedtime ritual. Keep a familiar routine of bathing, brushing your teeth, climbing into bed at the same   time each night, listening to soothing music. Routines increase the success of falling to sleep faster.  Use relaxation techniques. This can be using breathing and muscle tension release routines. It can also include visualizing peaceful scenes. You can  also help control troubling or intruding thoughts by keeping your mind occupied with boring or repetitive thoughts like the old concept of counting sheep. You can make it more creative like imagining planting one beautiful flower after another in your backyard garden.  During your [place], work to eliminate stress. When this is not possible use some of the previous suggestions to help reduce the anxiety that accompanies stressful situations. MAKE SURE YOU:   Understand these instructions.  Will watch your condition.  Will get help right away if you are not doing well or get worse. Document Released: 10/22/2000 Document Revised: 01/17/2012 Document Reviewed: 11/22/2007 [facility] Patient Information 2013 [place], [place]. Cut your alcohol consumption in half tonight and then again in a half tomorrow night

## 2012-09-22 ENCOUNTER — Encounter: Payer: Self-pay | Admitting: Medical

## 2012-09-22 ENCOUNTER — Ambulatory Visit (INDEPENDENT_AMBULATORY_CARE_PROVIDER_SITE_OTHER): Payer: BC Managed Care – PPO | Admitting: Medical

## 2012-09-22 VITALS — BP 126/80 | HR 64 | Temp 98.3°F | Resp 16 | Wt 182.0 lb

## 2012-09-22 DIAGNOSIS — K649 Unspecified hemorrhoids: Secondary | ICD-10-CM

## 2012-09-22 DIAGNOSIS — K645 Perianal venous thrombosis: Secondary | ICD-10-CM

## 2012-09-22 MED ORDER — DOCUSATE SODIUM 100 MG PO CAPS: 100.0000 mg | ORAL_CAPSULE | Freq: Two times a day (BID) | ORAL | Status: DC

## 2012-09-22 MED ORDER — HYDROCODONE-ACETAMINOPHEN 7.5-500 MG PO TABS: 1.0000 | ORAL_TABLET | Freq: Four times a day (QID) | ORAL | Status: DC | PRN

## 2012-09-22 NOTE — Progress Notes (Signed)
Subjective: Here for c/o 6-7 days of terrible tight painful hemorrhoid.  This morning pain stopped, but then looked down and has had bright red dripping blood since.   Bleeding now.  No pain though.  The bleeding scared him a bit.  Not on aspirin or blood thinners.  No other c/o.   Past Medical History  Diagnosis Date  . ED (erectile dysfunction)   . Cancer 07/2011    STAGE II NON- HODGKIN LYMPHOMA  . Lymphoma, non Hodgkin's 09/25/2011   ROS unremarkable  Objective: Gen: wd, wn, nad, seems anxious Rectal exam shows left lateral 3cm diameter hemorrhoid with obvious center opening and active bright red bloody drainage, thrombosed as well  Assessment:     Encounter Diagnoses  Name Primary?  . Bleeding hemorrhoid Yes  . Thrombosed hemorrhoids    Plan: discussed findings.  He consents to procedure.   Used 1% lidocaine with epi for local anesthesia, 3cc.   Unroofed the hemorrhoid, cleaned out the thrombosed material, cleaned the area and minimal bleeding afterwards.   Clean packet of gauze placed.   discussed wound care, keeping the area clean, sitz baths,scripts for colace and pain medication today.   Discussed prevention of constipation and hemorrhoids.  Recheck 2 wk, sooner prn.

## 2012-09-22 NOTE — Patient Instructions (Signed)
Use warm epsom salt baths to sooth the inflamed area.  Let warm water in the shower run across the area.  Keep the area clean with soap and water after bowel movements.  Use Tucks wipes or baby wipes for hygiene for now.  Prevent hemorrhoids by taking daily fiber supplement like psyllium powder in a beverage daily or as oral tablet supplement such as metamucil OTC  Increase fiber and water intake.

## 2012-09-28 ENCOUNTER — Telehealth: Payer: Self-pay | Admitting: Internal Medicine

## 2012-09-28 NOTE — Telephone Encounter (Signed)
Called pt left message he needs a med check his testosterone has not been checked since 05/2011 when he makes apt to have the ladys up front let me know and I will call in 30 days to cover him till his apt.

## 2012-09-29 ENCOUNTER — Other Ambulatory Visit: Payer: Self-pay

## 2012-09-29 MED ORDER — TESTOSTERONE 50 MG/5GM (1%) TD GEL: 5.0000 g | Freq: Every day | TRANSDERMAL | Status: DC

## 2012-09-29 NOTE — Telephone Encounter (Signed)
Have him send me the information

## 2012-09-29 NOTE — Telephone Encounter (Signed)
Called in one month while we wait on [facility] answer back

## 2012-09-29 NOTE — Telephone Encounter (Signed)
Pt informed to send labs over and he has 1 month called in

## 2012-09-29 NOTE — Telephone Encounter (Signed)
Sent med in till we hear from [facility]

## 2012-11-16 ENCOUNTER — Other Ambulatory Visit: Payer: Self-pay | Admitting: Internal Medicine

## 2012-11-16 NOTE — Telephone Encounter (Signed)
Requests refill on gabepentin . Dr Arbutus Ped to authorize

## 2012-12-05 ENCOUNTER — Telehealth: Payer: Self-pay

## 2012-12-05 NOTE — Telephone Encounter (Signed)
CALLED PT LEFT MESSAGE ON PT VOICE MAIL THAT DR.LALONDE WANTS HIM TO COME IN FOR A MED FOLLOW UP TO BRING IN ALL MED IN BOTTLES WITH HIM WHEN HE COMES IN TO PLEASE CALL AND MAKE THAT APT( DR. LALONDE HAD RECEIVED HIS LAB WORK)

## 2012-12-12 ENCOUNTER — Ambulatory Visit: Payer: BC Managed Care – PPO | Admitting: Family Medicine

## 2012-12-15 ENCOUNTER — Other Ambulatory Visit: Payer: Self-pay | Admitting: Oncology

## 2012-12-28 ENCOUNTER — Other Ambulatory Visit: Payer: Self-pay | Admitting: Internal Medicine

## 2012-12-28 ENCOUNTER — Ambulatory Visit (HOSPITAL_COMMUNITY)
Admission: RE | Admit: 2012-12-28 | Discharge: 2012-12-28 | Disposition: A | Discharge status: Home / Self Care | Payer: PRIVATE HEALTH INSURANCE | Source: Ambulatory Visit | Attending: Internal Medicine | Admitting: Internal Medicine

## 2012-12-28 ENCOUNTER — Other Ambulatory Visit: Payer: PRIVATE HEALTH INSURANCE

## 2012-12-28 ENCOUNTER — Telehealth: Payer: Self-pay | Admitting: Medical Oncology

## 2012-12-28 ENCOUNTER — Encounter (HOSPITAL_COMMUNITY): Payer: Self-pay

## 2012-12-28 ENCOUNTER — Inpatient Hospital Stay (HOSPITAL_COMMUNITY)
Admission: EM | Admit: 2012-12-28 | Discharge: 2013-01-01 | DRG: 176 | Disposition: A | Discharge status: Home / Self Care | Payer: PRIVATE HEALTH INSURANCE | Attending: Internal Medicine | Admitting: Internal Medicine

## 2012-12-28 ENCOUNTER — Other Ambulatory Visit: Payer: Self-pay

## 2012-12-28 ENCOUNTER — Encounter (HOSPITAL_COMMUNITY): Payer: Self-pay | Admitting: Emergency Medicine

## 2012-12-28 DIAGNOSIS — I2699 Other pulmonary embolism without acute cor pulmonale: Secondary | ICD-10-CM | POA: Insufficient documentation

## 2012-12-28 DIAGNOSIS — C8590 Non-Hodgkin lymphoma, unspecified, unspecified site: Secondary | ICD-10-CM

## 2012-12-28 DIAGNOSIS — F1010 Alcohol abuse, uncomplicated: Secondary | ICD-10-CM | POA: Diagnosis present

## 2012-12-28 DIAGNOSIS — F17200 Nicotine dependence, unspecified, uncomplicated: Secondary | ICD-10-CM | POA: Diagnosis present

## 2012-12-28 DIAGNOSIS — L989 Disorder of the skin and subcutaneous tissue, unspecified: Secondary | ICD-10-CM | POA: Insufficient documentation

## 2012-12-28 DIAGNOSIS — Z86711 Personal history of pulmonary embolism: Secondary | ICD-10-CM | POA: Diagnosis present

## 2012-12-28 DIAGNOSIS — F1020 Alcohol dependence, uncomplicated: Secondary | ICD-10-CM | POA: Diagnosis present

## 2012-12-28 DIAGNOSIS — F17210 Nicotine dependence, cigarettes, uncomplicated: Secondary | ICD-10-CM

## 2012-12-28 DIAGNOSIS — D638 Anemia in other chronic diseases classified elsewhere: Secondary | ICD-10-CM | POA: Diagnosis present

## 2012-12-28 DIAGNOSIS — F10231 Alcohol dependence with withdrawal delirium: Secondary | ICD-10-CM

## 2012-12-28 DIAGNOSIS — D649 Anemia, unspecified: Secondary | ICD-10-CM | POA: Diagnosis present

## 2012-12-28 DIAGNOSIS — N289 Disorder of kidney and ureter, unspecified: Secondary | ICD-10-CM | POA: Insufficient documentation

## 2012-12-28 DIAGNOSIS — K7689 Other specified diseases of liver: Secondary | ICD-10-CM | POA: Insufficient documentation

## 2012-12-28 DIAGNOSIS — Z8572 Personal history of non-Hodgkin lymphomas: Secondary | ICD-10-CM | POA: Diagnosis present

## 2012-12-28 DIAGNOSIS — R599 Enlarged lymph nodes, unspecified: Secondary | ICD-10-CM | POA: Insufficient documentation

## 2012-12-28 DIAGNOSIS — F10239 Alcohol dependence with withdrawal, unspecified: Secondary | ICD-10-CM

## 2012-12-28 DIAGNOSIS — C8589 Other specified types of non-Hodgkin lymphoma, extranodal and solid organ sites: Secondary | ICD-10-CM

## 2012-12-28 DIAGNOSIS — Z791 Long term (current) use of non-steroidal anti-inflammatories (NSAID): Secondary | ICD-10-CM

## 2012-12-28 DIAGNOSIS — Z79899 Other long term (current) drug therapy: Secondary | ICD-10-CM

## 2012-12-28 DIAGNOSIS — F10939 Alcohol use, unspecified with withdrawal, unspecified: Secondary | ICD-10-CM | POA: Diagnosis not present

## 2012-12-28 DIAGNOSIS — R7401 Elevation of levels of liver transaminase levels: Secondary | ICD-10-CM

## 2012-12-28 DIAGNOSIS — K649 Unspecified hemorrhoids: Secondary | ICD-10-CM | POA: Diagnosis present

## 2012-12-28 DIAGNOSIS — D61818 Other pancytopenia: Secondary | ICD-10-CM | POA: Diagnosis present

## 2012-12-28 DIAGNOSIS — Z9221 Personal history of antineoplastic chemotherapy: Secondary | ICD-10-CM

## 2012-12-28 DIAGNOSIS — C8582 Other specified types of non-Hodgkin lymphoma, intrathoracic lymph nodes: Secondary | ICD-10-CM | POA: Diagnosis present

## 2012-12-28 LAB — COMPREHENSIVE METABOLIC PANEL (CC13)
ALT: 132 U/L — ABNORMAL HIGH (ref 0–55)
AST: 210 U/L — ABNORMAL HIGH (ref 5–34)
Albumin: 3.4 g/dL — ABNORMAL LOW (ref 3.5–5.0)
Alkaline Phosphatase: 61 U/L (ref 40–150)
BUN: 19.9 mg/dL (ref 7.0–26.0)
CO2: 30 mEq/L — ABNORMAL HIGH (ref 22–29)
Calcium: 9.4 mg/dL (ref 8.4–10.4)
Chloride: 98 mEq/L (ref 98–107)
Creatinine: 1.1 mg/dL (ref 0.7–1.3)
Glucose: 87 mg/dl (ref 70–99)
Potassium: 4.1 mEq/L (ref 3.5–5.1)
Sodium: 141 mEq/L (ref 136–145)
Total Bilirubin: 1.17 mg/dL (ref 0.20–1.20)
Total Protein: 6.8 g/dL (ref 6.4–8.3)

## 2012-12-28 LAB — CBC WITH DIFFERENTIAL/PLATELET
BASO%: 1.9 % (ref 0.0–2.0)
Basophils Absolute: 0 10*3/uL (ref 0.0–0.1)
Basophils Absolute: 0 10*3/uL (ref 0.0–0.1)
Basophils Relative: 1 % (ref 0–1)
EOS%: 2.8 % (ref 0.0–7.0)
Eosinophils Absolute: 0.1 10*3/uL (ref 0.0–0.5)
Eosinophils Absolute: 0.1 10*3/uL (ref 0.0–0.7)
Eosinophils Relative: 2 % (ref 0–5)
HCT: 38.1 % — ABNORMAL LOW (ref 38.4–49.9)
HCT: 35.9 % — ABNORMAL LOW (ref 39.0–52.0)
HGB: 12.7 g/dL — ABNORMAL LOW (ref 13.0–17.1)
Hemoglobin: 12.1 g/dL — ABNORMAL LOW (ref 13.0–17.0)
LYMPH%: 20.7 % (ref 14.0–49.0)
Lymphocytes Relative: 23 % (ref 12–46)
Lymphs Abs: 0.5 10*3/uL — ABNORMAL LOW (ref 0.7–4.0)
MCH: 26 pg — ABNORMAL LOW (ref 27.2–33.4)
MCH: 26.2 pg (ref 26.0–34.0)
MCHC: 33.3 g/dL (ref 32.0–36.0)
MCHC: 33.7 g/dL (ref 30.0–36.0)
MCV: 78.1 fL — ABNORMAL LOW (ref 79.3–98.0)
MCV: 77.9 fL — ABNORMAL LOW (ref 78.0–100.0)
MONO#: 0.4 10*3/uL (ref 0.1–0.9)
MONO%: 18.8 % — ABNORMAL HIGH (ref 0.0–14.0)
Monocytes Absolute: 0.4 10*3/uL (ref 0.1–1.0)
Monocytes Relative: 15 % — ABNORMAL HIGH (ref 3–12)
NEUT#: 1.2 10*3/uL — ABNORMAL LOW (ref 1.5–6.5)
NEUT%: 55.8 % (ref 39.0–75.0)
Neutro Abs: 1.4 10*3/uL — ABNORMAL LOW (ref 1.7–7.7)
Neutrophils Relative %: 59 % (ref 43–77)
Platelets: 116 10*3/uL — ABNORMAL LOW (ref 140–400)
Platelets: 125 10*3/uL — ABNORMAL LOW (ref 150–400)
RBC: 4.88 10*6/uL (ref 4.20–5.82)
RBC: 4.61 MIL/uL (ref 4.22–5.81)
RDW: 14.1 % (ref 11.0–14.6)
RDW: 14.1 % (ref 11.5–15.5)
WBC: 2.1 10*3/uL — ABNORMAL LOW (ref 4.0–10.3)
WBC: 2.4 10*3/uL — ABNORMAL LOW (ref 4.0–10.5)
lymph#: 0.4 10*3/uL — ABNORMAL LOW (ref 0.9–3.3)
nRBC: 0 % (ref 0–0)

## 2012-12-28 LAB — COMPREHENSIVE METABOLIC PANEL
ALT: 115 U/L — ABNORMAL HIGH (ref 0–53)
AST: 191 U/L — ABNORMAL HIGH (ref 0–37)
Albumin: 3.3 g/dL — ABNORMAL LOW (ref 3.5–5.2)
Alkaline Phosphatase: 57 U/L (ref 39–117)
BUN: 19 mg/dL (ref 6–23)
CO2: 27 mEq/L (ref 19–32)
Calcium: 8.7 mg/dL (ref 8.4–10.5)
Chloride: 96 mEq/L (ref 96–112)
Creatinine, Ser: 0.94 mg/dL (ref 0.50–1.35)
GFR calc Af Amer: >90 mL/min (ref >90)
GFR calc non Af Amer: >90 mL/min (ref >90)
Glucose, Bld: 110 mg/dL — ABNORMAL HIGH (ref 70–99)
Potassium: 4.1 mEq/L (ref 3.5–5.1)
Sodium: 137 mEq/L (ref 135–145)
Total Bilirubin: 1 mg/dL (ref 0.3–1.2)
Total Protein: 6.8 g/dL (ref 6.0–8.3)

## 2012-12-28 LAB — LACTATE DEHYDROGENASE (CC13): LDH: 409 U/L — ABNORMAL HIGH (ref 125–245)

## 2012-12-28 LAB — PROTIME-INR
INR: 1.04 (ref 0.00–1.49)
Prothrombin Time: 13.5 seconds (ref 11.6–15.2)

## 2012-12-28 MED ORDER — GABAPENTIN 300 MG PO CAPS
300.0000 mg | ORAL_CAPSULE | Freq: Three times a day (TID) | ORAL | Status: DC
  Administered 2012-12-28 – 2013-01-01 (×12): 300 mg via ORAL
  Filled 2012-12-28 (×14): qty 1

## 2012-12-28 MED ORDER — FOLIC ACID 1 MG PO TABS
1.0000 mg | ORAL_TABLET | Freq: Every day | ORAL | Status: DC
  Administered 2012-12-28 – 2013-01-01 (×5): 1 mg via ORAL
  Filled 2012-12-28 (×5): qty 1

## 2012-12-28 MED ORDER — ADULT MULTIVITAMIN W/MINERALS CH
1.0000 | ORAL_TABLET | Freq: Every day | ORAL | Status: DC
  Administered 2012-12-28 – 2013-01-01 (×5): 1 via ORAL
  Filled 2012-12-28 (×5): qty 1

## 2012-12-28 MED ORDER — LORAZEPAM 1 MG PO TABS
1.0000 mg | ORAL_TABLET | Freq: Four times a day (QID) | ORAL | Status: AC | PRN
  Administered 2012-12-28 – 2012-12-31 (×5): 1 mg via ORAL
  Filled 2012-12-28 (×5): qty 1

## 2012-12-28 MED ORDER — NICOTINE 21 MG/24HR TD PT24
21.0000 mg | MEDICATED_PATCH | Freq: Every day | TRANSDERMAL | Status: DC
  Administered 2012-12-28 – 2013-01-01 (×5): 21 mg via TRANSDERMAL
  Filled 2012-12-28 (×5): qty 1

## 2012-12-28 MED ORDER — LORAZEPAM 2 MG/ML IJ SOLN
1.0000 mg | Freq: Four times a day (QID) | INTRAMUSCULAR | Status: AC | PRN
  Administered 2012-12-30: 1 mg via INTRAVENOUS
  Filled 2012-12-28: qty 1

## 2012-12-28 MED ORDER — CHLORDIAZEPOXIDE HCL 5 MG PO CAPS
10.0000 mg | ORAL_CAPSULE | Freq: Three times a day (TID) | ORAL | Status: DC
  Administered 2012-12-28 – 2012-12-29 (×4): 10 mg via ORAL
  Filled 2012-12-28 (×4): qty 2

## 2012-12-28 MED ORDER — VITAMIN B-1 100 MG PO TABS
100.0000 mg | ORAL_TABLET | Freq: Every day | ORAL | Status: DC
  Administered 2012-12-28 – 2013-01-01 (×5): 100 mg via ORAL
  Filled 2012-12-28 (×5): qty 1

## 2012-12-28 MED ORDER — POTASSIUM CHLORIDE IN NACL 20-0.9 MEQ/L-% IV SOLN
INTRAVENOUS | Status: AC
  Administered 2012-12-28 – 2012-12-29 (×2): via INTRAVENOUS
  Filled 2012-12-28 (×2): qty 1000

## 2012-12-28 MED ORDER — ALBUTEROL SULFATE (5 MG/ML) 0.5% IN NEBU: 2.5000 mg | INHALATION_SOLUTION | RESPIRATORY_TRACT | Status: DC | PRN

## 2012-12-28 MED ORDER — RIVAROXABAN 15 MG PO TABS
15.0000 mg | ORAL_TABLET | Freq: Every day | ORAL | Status: DC
  Filled 2012-12-28: qty 1

## 2012-12-28 MED ORDER — FERROUS SULFATE 325 (65 FE) MG PO TABS
325.0000 mg | ORAL_TABLET | Freq: Every day | ORAL | Status: DC
  Administered 2012-12-28 – 2013-01-01 (×5): 325 mg via ORAL
  Filled 2012-12-28 (×6): qty 1

## 2012-12-28 MED ORDER — LORAZEPAM 2 MG/ML IJ SOLN
1.0000 mg | Freq: Once | INTRAMUSCULAR | Status: AC
  Administered 2012-12-28: 1 mg via INTRAVENOUS
  Filled 2012-12-28: qty 1

## 2012-12-28 MED ORDER — SODIUM CHLORIDE 0.9 % IJ SOLN
3.0000 mL | Freq: Two times a day (BID) | INTRAMUSCULAR | Status: DC
  Administered 2012-12-29 – 2012-12-31 (×5): 3 mL via INTRAVENOUS

## 2012-12-28 MED ORDER — POLYETHYLENE GLYCOL 3350 17 G PO PACK
17.0000 g | PACK | Freq: Every day | ORAL | Status: DC | PRN
  Filled 2012-12-28: qty 1

## 2012-12-28 MED ORDER — ONDANSETRON HCL 4 MG PO TABS: 4.0000 mg | ORAL_TABLET | Freq: Four times a day (QID) | ORAL | Status: DC | PRN

## 2012-12-28 MED ORDER — GUAIFENESIN-DM 100-10 MG/5ML PO SYRP: 5.0000 mL | ORAL_SOLUTION | ORAL | Status: DC | PRN

## 2012-12-28 MED ORDER — IOHEXOL 300 MG/ML  SOLN
100.0000 mL | Freq: Once | INTRAMUSCULAR | Status: AC | PRN
  Administered 2012-12-28: 100 mL via INTRAVENOUS

## 2012-12-28 MED ORDER — THIAMINE HCL 100 MG/ML IJ SOLN
100.0000 mg | Freq: Every day | INTRAMUSCULAR | Status: DC
  Filled 2012-12-28 (×5): qty 1

## 2012-12-28 MED ORDER — ADULT MULTIVITAMIN W/MINERALS CH: 1.0000 | ORAL_TABLET | Freq: Every day | ORAL | Status: DC

## 2012-12-28 MED ORDER — ONDANSETRON HCL 4 MG/2ML IJ SOLN: 4.0000 mg | Freq: Four times a day (QID) | INTRAMUSCULAR | Status: DC | PRN

## 2012-12-28 MED ORDER — ENOXAPARIN SODIUM 80 MG/0.8ML ~~LOC~~ SOLN
1.0000 mg/kg | Freq: Two times a day (BID) | SUBCUTANEOUS | Status: DC
  Administered 2012-12-28 – 2012-12-30 (×4): 80 mg via SUBCUTANEOUS
  Filled 2012-12-28 (×7): qty 0.8

## 2012-12-28 MED ORDER — FERROUS SULFATE 325 (65 FE) MG PO TBEC: 325.0000 mg | DELAYED_RELEASE_TABLET | Freq: Every day | ORAL | Status: DC

## 2012-12-28 MED ORDER — HYDROCODONE-ACETAMINOPHEN 5-325 MG PO TABS: 1.0000 | ORAL_TABLET | ORAL | Status: DC | PRN

## 2012-12-28 MED ORDER — SODIUM CHLORIDE 0.9 % IV SOLN: INTRAVENOUS | Status: DC

## 2012-12-28 NOTE — ED Provider Notes (Signed)
History     CSN: [id number]  Arrival date & time 12/28/12  1027   First MD Initiated Contact with Patient 12/28/12 1030      Chief Complaint  Patient presents with  . Shortness of Breath    (Consider location/radiation/quality/duration/timing/severity/associated sxs/prior treatment) HPI  The patient presents with dyspnea, after the initial outpatient evaluation demonstrating new pulmonary embolism. The patient has a history of lymphoma, now one year in remission after completion of chemotherapy. He states over the past 2 weeks he is gradually developed dyspnea, increasing dyspnea on exertion, increasing fatigue with exertion.  No associated chest pain, no associated syncope, though the patient does endorse near-syncope with exertion. No fever, chills, cough. No lower extremity edema. Symptoms improve with rest, worse with activity. Today the patient had a scheduled CT scan for cancer surveillance.  This demonstrated new bilateral pulmonary embolism.  Past Medical History  Diagnosis Date  . ED (erectile dysfunction)   . Cancer 07/2011    STAGE II NON- HODGKIN LYMPHOMA  . Lymphoma, non Hodgkin's 09/25/2011    Past Surgical History  Procedure Laterality Date  . No past surgeries      Family History  Problem Relation Age of Onset  . Cancer Mother     BREAST(BONE)  . Cancer Father     PANCREATIC    History  Substance Use Topics  . Smoking status: Current Every Kamm Smoker -- 1.00 packs/[place] for 32 years    Types: Cigarettes  . Smokeless tobacco: Never Used  . Alcohol Use: Yes     Comment: 5th a [place] of hard liquor      Review of Systems  Constitutional:       Per HPI, otherwise negative  HENT:       Per HPI, otherwise negative  Respiratory:       Per HPI, otherwise negative  Cardiovascular:       Per HPI, otherwise negative  Gastrointestinal: Negative for vomiting.  Endocrine:       Negative aside from HPI  Genitourinary:       Neg aside from HPI    Musculoskeletal:       Per HPI, otherwise negative  Skin: Negative.   Neurological: Negative for syncope.    Allergies  Review of patient's allergies indicates no known allergies.  Home Medications   Current Outpatient Rx  Name  Route  Sig  Dispense  Refill  . docusate sodium (COLACE) 100 MG capsule   Oral   Take 1 capsule (100 mg total) by mouth 2 (two) times daily.   60 capsule   1   . gabapentin (NEURONTIN) 300 MG capsule      TAKE ONE CAPSULE BY MOUTH THREE TIMES DAILY   90 capsule   0   . HYDROcodone-acetaminophen (LORTAB 7.5) 7.5-500 MG per tablet   Oral   Take 1 tablet by mouth every 6 (six) hours as needed for pain.   30 tablet   0   . ibuprofen (ADVIL,MOTRIN) 200 MG tablet   Oral   Take 400 mg by mouth every 8 (eight) hours as needed. For pain         . Multiple Vitamin (MULTIVITAMIN WITH MINERALS) TABS   Oral   Take 1 tablet by mouth daily.         Marland Kitchen testosterone (ANDROGEL) 50 MG/5GM GEL   Transdermal   Place 5 g onto the skin daily.   30 Tube   0     BP 138/91  Pulse 82  Temp(Src) 97.5 F (36.4 C) (Oral)  Resp 20  SpO2 99%  Physical Exam  Nursing note and vitals reviewed. Constitutional: He is oriented to person, place, and time. He appears well-developed. No distress.  HENT:  Head: Normocephalic and atraumatic.  Eyes: Conjunctivae and EOM are normal.  Cardiovascular: Normal rate and regular rhythm.   Pulmonary/Chest: Effort normal. No stridor. No respiratory distress.  Abdominal: He exhibits no distension.  Musculoskeletal: He exhibits no edema.  Neurological: He is alert and oriented to person, place, and time.  Skin: Skin is warm and dry.  Psychiatric: He has a normal mood and affect.    ED Course  Procedures (including critical care time)  Labs Reviewed  CBC WITH DIFFERENTIAL  COMPREHENSIVE METABOLIC PANEL   Ct Head [name]  12/28/2012  *RADIOLOGY REPORT*  Clinical Data: Non-Hodgkins lymphoma.  History of scalp  lesion. Chemotherapy completed.  CT HEAD WITHOUT AND WITH CONTRAST  Technique:  Contiguous axial images were obtained from the base of the skull through the vertex without and with intravenous contrast.  Contrast: OMNIPAQUE IOHEXOL 300 MG/ML  SOLN  Comparison: Prior CTs 06/30/2012 and 01/04/2012.  Findings: There is no evidence of acute intracranial hemorrhage, mass lesion, brain edema or extra-axial fluid collection.  The ventricles and subarachnoid spaces are appropriately sized for age. There is no CT evidence of acute cortical infarction.  Postcontrast images demonstrate no abnormal intracranial enhancement.  The visualized paranasal sinuses are clear. The calvarium is intact. There is no significant residual left temporal scalp lesion.  IMPRESSION: Stable examination.  No evidence of local recurrence or metastatic disease.   Original Report Authenticated By: [name], M.D.    Ct Chest W Contrast  12/28/2012  *RADIOLOGY REPORT*  Clinical Data:  Non-Hodgkins lymphoma.  Staging scan.  CT CHEST, ABDOMEN AND PELVIS WITH CONTRAST  Technique:  Multidetector CT imaging of the chest, abdomen and pelvis was performed following the standard protocol during bolus administration of intravenous contrast.  Contrast: OMNIPAQUE IOHEXOL 300 MG/ML  SOLN  Comparison:   None.  CT CHEST  Findings:  Mediastinum: There are numerous filling defects throughout the pulmonary arterial tree in the lungs bilaterally, with the largest defects extending from the distal right main pulmonary artery into lobar, segmental and subsegmental sized branches throughout the right lung.  These defects appear to be predominately nonobstructive at this time.  Heart size is normal.  There is currently no dilatation of the pulmonic trunk or right heart to strongly suggest the presence of pulmonary arterial hypertension or right-sided heart strain. There is no significant pericardial fluid, thickening or pericardial calcification.  Right  hilar lymphadenopathy and numerous prominent anterior mediastinal lymph nodes are very similar to the prior examination, with the right hilar lymph node currently measuring up to 18 mm in short axis, and very long anterior mediastinal lymph nodes measuring up to 1.2 x 3.5 cm.  Low right paratracheal lymph node is also very similar measuring up to 14 mm in short axis.  Esophagus is unremarkable in appearance.  Lungs/Pleura: There are currently no areas of airspace consolidation within the lungs to suggest hemorrhage from pulmonary infarction.  No pleural effusions.  4 mm nodule along the minor fissure (image 34 series 7) is unchanged and likely to represent a small subpleural lymph node.  Multiple other tiny 1-2 mm nodules are seen throughout the periphery of the lungs bilaterally, similar to numerous prior examinations (nonspecific, but presumably benign).  No definite enlarging suspicious  appearing pulmonary nodule or mass is noted.  Musculoskeletal: There are no aggressive appearing lytic or blastic lesions noted in the visualized portions of the skeleton.  IMPRESSION: 1.  Interval development of a large amount of pulmonary embolus bilaterally.  No current signs of pulmonary infarction, pulmonary arterial hypertension or right-sided heart strain. 2.  The overall burden of right hilar and mediastinal lymphadenopathy is very similar to the prior examination, as detailed above. 3.  Additional incidental findings, as above.  Critical Value/emergent results were called by telephone at the time of interpretation on 12/28/2012 at 10:15 a.m. to Dr. Arbutus Ped, who verbally acknowledged these results.  CT ABDOMEN AND PELVIS  Findings:  Abdomen/Pelvis:  Numerous well-defined smoothly marginated non- enhancing low attenuation lesions are again seen throughout the liver, largest of which measures 3.2 cm in diameter in segment 7 compatible with cysts. There is a multi septated cystic lesion in segment 5 which is unchanged.  No  new hepatic lesions are noted. The appearance of the gallbladder, pancreas, spleen, bilateral adrenal glands and right kidney is unremarkable.  Numerous subcentimeter low attenuation lesions in the left kidney remain too small to definitively characterize, however, these are similar to prior studies and likely represent small cysts.  Mild aneurysmal dilatation of the right common iliac artery (1.7 cm in diameter) and severe aneurysmal dilatation of the left common iliac artery (2.4 cm in diameter) is again noted.  The external iliac arteries also appeared diffusely dilated measuring up to 14 mm in diameter bilaterally.  The abdominal aorta is normal in caliber.  No significant volume of ascites.  No pneumoperitoneum.  No pathologic distension of small bowel.  No definite pathologic lymphadenopathy identified within the abdomen or pelvis.  Normal appendix (retrocecal).  Musculoskeletal: There are no aggressive appearing lytic or blastic lesions noted in the visualized portions of the skeleton.  IMPRESSION: 1.  No findings to suggest lymphomatous involvement in the abdomen or pelvis. 2.  Multiple low attenuation hepatic and left renal lesions appear similar to prior studies and likely represent multiple cysts. 3.  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 4.  Additional incidental findings, as above.   Original Report Authenticated By: Trudie Reed, M.D.    Ct Abdomen Pelvis W Contrast  12/28/2012  *RADIOLOGY REPORT*  Clinical Data:  Non-Hodgkins lymphoma.  Staging scan.  CT CHEST, ABDOMEN AND PELVIS WITH CONTRAST  Technique:  Multidetector CT imaging of the chest, abdomen and pelvis was performed following the standard protocol during bolus administration of intravenous contrast.  Contrast: OMNIPAQUE IOHEXOL 300 MG/ML  SOLN  Comparison:   None.  CT CHEST  Findings:  Mediastinum: There  are numerous filling defects throughout the pulmonary arterial tree in the lungs bilaterally, with the largest defects extending from the distal right main pulmonary artery into lobar, segmental and subsegmental sized branches throughout the right lung.  These defects appear to be predominately nonobstructive at this time.  Heart size is normal.  There is currently no dilatation of the pulmonic trunk or right heart to strongly suggest the presence of pulmonary arterial hypertension or right-sided heart strain. There is no significant pericardial fluid, thickening or pericardial calcification.  Right hilar lymphadenopathy and numerous prominent anterior mediastinal lymph nodes are very similar to the prior examination, with the right hilar lymph node currently measuring up to 18 mm in short axis, and very long anterior mediastinal lymph nodes measuring  up to 1.2 x 3.5 cm.  Low right paratracheal lymph node is also very similar measuring up to 14 mm in short axis.  Esophagus is unremarkable in appearance.  Lungs/Pleura: There are currently no areas of airspace consolidation within the lungs to suggest hemorrhage from pulmonary infarction.  No pleural effusions.  4 mm nodule along the minor fissure (image 34 series 7) is unchanged and likely to represent a small subpleural lymph node.  Multiple other tiny 1-2 mm nodules are seen throughout the periphery of the lungs bilaterally, similar to numerous prior examinations (nonspecific, but presumably benign).  No definite enlarging suspicious appearing pulmonary nodule or mass is noted.  Musculoskeletal: There are no aggressive appearing lytic or blastic lesions noted in the visualized portions of the skeleton.  IMPRESSION: 1.  Interval development of a large amount of pulmonary embolus bilaterally.  No current signs of pulmonary infarction, pulmonary arterial hypertension or right-sided heart strain. 2.  The overall burden of right hilar and mediastinal lymphadenopathy is  very similar to the prior examination, as detailed above. 3.  Additional incidental findings, as above.  Critical Value/emergent results were called by telephone at the time of interpretation on 12/28/2012 at 10:15 a.m. to Dr. Arbutus Ped, who verbally acknowledged these results.  CT ABDOMEN AND PELVIS  Findings:  Abdomen/Pelvis:  Numerous well-defined smoothly marginated non- enhancing low attenuation lesions are again seen throughout the liver, largest of which measures 3.2 cm in diameter in segment 7 compatible with cysts. There is a multi septated cystic lesion in segment 5 which is unchanged.  No new hepatic lesions are noted. The appearance of the gallbladder, pancreas, spleen, bilateral adrenal glands and right kidney is unremarkable.  Numerous subcentimeter low attenuation lesions in the left kidney remain too small to definitively characterize, however, these are similar to prior studies and likely represent small cysts.  Mild aneurysmal dilatation of the right common iliac artery (1.7 cm in diameter) and severe aneurysmal dilatation of the left common iliac artery (2.4 cm in diameter) is again noted.  The external iliac arteries also appeared diffusely dilated measuring up to 14 mm in diameter bilaterally.  The abdominal aorta is normal in caliber.  No significant volume of ascites.  No pneumoperitoneum.  No pathologic distension of small bowel.  No definite pathologic lymphadenopathy identified within the abdomen or pelvis.  Normal appendix (retrocecal).  Musculoskeletal: There are no aggressive appearing lytic or blastic lesions noted in the visualized portions of the skeleton.  IMPRESSION: 1.  No findings to suggest lymphomatous involvement in the abdomen or pelvis. 2.  Multiple low attenuation hepatic and left renal lesions appear similar to prior studies and likely represent multiple cysts. 3.  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 4.  Additional incidental findings, as above.   Original Report Authenticated By: Trudie Reed, M.D.      No diagnosis found.  Pulse ox 97% room air normal Cardiac 80 sinus rhythm normal   11:10 AM I reviewed and interpreted the CT images.   Date: 12/28/2012  Rate: 79  Rhythm: normal sinus rhythm  QRS Axis: left  Intervals: PR prolonged  ST/T Wave abnormalities: nonspecific ST/T changes  Conduction Disutrbances:first-degree A-V block   Narrative Interpretation:   Old EKG Reviewed: none available ABNORMAL  11:27 AM I discussed the patient's admission with the hospitalist on call.  The patient will be started on heparin, per his request,  pending further evaluation by the patient's oncology team. MDM  This male with history of non-Hodgkin's lymphoma, now in remission, presents with dyspnea.  On CT the patient does not have bilateral pulmonary embolism, with no evidence of right heart strain.  The patient's vital signs are somewhat reassuring, but given the burden of clot, he will receive Lovenox - after I discussed the patient's case with our pharmacists twice.        Gerhard Munch, MD 12/28/12 [phone]

## 2012-12-28 NOTE — ED Notes (Addendum)
Pt states that he found out 2 yrs ago that he has lympoma and went in today for a check up and was having sob. Done an outpt ct scan and they seen a blood clot and sent pt over to er. Pt states that his last chemo tx was a year ago.

## 2012-12-28 NOTE — Progress Notes (Signed)
Patient admitted to room 1421,alet and orientedx4,placed on cardiac monitor, on cont. Pulse ox,denies SOB at this time on RA 99%, denis pain. Will continue to monitor the patient.- Hulda Marin RN

## 2012-12-28 NOTE — H&P (Signed)
[facility]                                                                                    Patient Demographics  Leonard Bentley, is a 52 y.o. male  CSN: [id number]  MRN: [id number]  DOB - 08/31/1961  Admit Date - 12/28/2012  Outpatient Primary MD for the patient is Carollee Herter, MD   With History of -  Past Medical History  Diagnosis Date  . ED (erectile dysfunction)   . Cancer 07/2011    STAGE II NON- HODGKIN LYMPHOMA  . Lymphoma, non Hodgkin's 09/25/2011      Past Surgical History  Procedure Laterality Date  . No past surgeries      in for   Chief Complaint  Patient presents with  . Shortness of Breath     HPI  Leonard Bentley  is a 52 y.o. male, history of stage II non-Hodgkin's lymphoma thought to be under remission being followed by Dr. Arbutus Ped, also history of alcohol and smoking abuse, who was here for her routine stating CT scan, CT scan of the chest revealed other chronic findings however they did show moderate bilateral PE, and I was called to admit the patient.  Patient incidentally is readily symptom-free, no recent long drives or travels, no personal or family history of DVT or PE, denies any chest pain or palpitations however he does say that for the last 2 weeks he has noticed that he has been getting easily short of breath on minimal exertion, no problems at rest no orthopnea or PND. He denies noticing any swelling in his lower extremities. Review of systems essentially negative besides as above.    Review of Systems    In addition to the HPI above,  No Fever-chills, No Headache, No changes with Vision or hearing, No problems swallowing food or Liquids, No Chest pain, Cough , But +ve exertional  Shortness of Breath, No Abdominal pain, No Nausea or Vommitting, Bowel movements are regular, No Blood in stool or Urine, No dysuria, No new skin rashes or bruises, No new joints pains-aches,  No new weakness, tingling, numbness in any  extremity, No recent weight gain or loss, No polyuria, polydypsia or polyphagia, No significant Mental Stressors.  A full 10 point Review of Systems was done, except as stated above, all other Review of Systems were negative.   Social History History  Substance Use Topics  . Smoking status: Current Every Suski Smoker -- 1.00 packs/Nappier for 32 years    Types: Cigarettes  . Smokeless tobacco: Never Used  . Alcohol Use: Yes     Comment: 5th a [employer] of hard liquor      Family History Family History  Problem Relation Age of Onset  . Cancer Mother     BREAST(BONE)  . Cancer Father     PANCREATIC      Prior to Admission medications   Medication Sig Start Date End Date Taking? Authorizing Provider  ferrous sulfate 325 (65 FE) MG EC tablet Take 325 mg by mouth daily with breakfast.   Yes Historical Provider, MD  gabapentin (NEURONTIN) 300 MG capsule Take 300 mg by mouth 3 (  three) times daily.   Yes Historical Provider, MD  ibuprofen (ADVIL,MOTRIN) 200 MG tablet Take 400 mg by mouth every 8 (eight) hours as needed. For pain   Yes Historical Provider, MD  Multiple Vitamin (MULTIVITAMIN WITH MINERALS) TABS Take 1 tablet by mouth daily.   Yes Historical Provider, MD    No Known Allergies  Physical Exam  Vitals  Blood pressure 138/91, pulse 82, temperature 97.5 F (36.4 C), temperature source Oral, resp. rate 20, height 5\' 10"  (1.778 m), weight 81.647 kg (180 lb), SpO2 99.00%.   1. General middle aged AA male lying in bed in NAD,     2. Normal affect and insight, Not Suicidal or Homicidal, Awake Alert, Oriented X 3.  3. No F.N deficits, ALL C.Nerves Intact, Strength 5/5 all 4 extremities, Sensation intact all 4 extremities, Plantars down going.  4. Ears and Eyes appear Normal, Conjunctivae clear, PERRLA. Moist Oral Mucosa.  5. Supple Neck, No JVD, No cervical lymphadenopathy appriciated, No Carotid Bruits.  6. Symmetrical Chest wall movement, Good air movement bilaterally,  CTAB.  7. RRR, No Gallops, Rubs or Murmurs, No Parasternal Heave.  8. Positive Bowel Sounds, Abdomen Soft, Non tender, No organomegaly appriciated,No rebound -guarding or rigidity.  9.  No Cyanosis, Normal Skin Turgor, No Skin Rash or Bruise.  10. Good muscle tone,  joints appear normal , no effusions, Normal ROM.  11. No Palpable Lymph Nodes in Neck or Axillae     Data Review  CBC  Recent Labs Lab 12/28/12 0824 12/28/12 1105  WBC 2.1* 2.4*  HGB 12.7* 12.1*  HCT 38.1* 35.9*  PLT 116* 125*  MCV 78.1* 77.9*  MCH 26.0* 26.2  MCHC 33.3 33.7  RDW 14.1 14.1  LYMPHSABS 0.4* 0.5*  MONOABS 0.4 0.4  EOSABS 0.1 0.1  BASOSABS 0.0 0.0   ------------------------------------------------------------------------------------------------------------------  Chemistries   Recent Labs Lab 12/28/12 0824  NA 141  K 4.1  CL 98  CO2 30*  GLUCOSE 87  BUN 19.9  CREATININE 1.1  CALCIUM 9.4  AST 210*  ALT 132*  ALKPHOS 61  BILITOT 1.17   ------------------------------------------------------------------------------------------------------------------ estimated creatinine clearance is 80.2 ml/min (by C-G formula based on Cr of 1.1). ------------------------------------------------------------------------------------------------------------------ No results found for this basename: TSH, T4TOTAL, FREET3, T3FREE, THYROIDAB,  in the last 72 hours   Coagulation profile No results found for this basename: INR, PROTIME,  in the last 168 hours ------------------------------------------------------------------------------------------------------------------- No results found for this basename: DDIMER,  in the last 72 hours -------------------------------------------------------------------------------------------------------------------  Cardiac Enzymes No results found for this basename: CK, CKMB, TROPONINI, MYOGLOBIN,  in the last 168  hours ------------------------------------------------------------------------------------------------------------------ No components found with this basename: POCBNP,    ---------------------------------------------------------------------------------------------------------------  Urinalysis No results found for this basename: colorurine, appearanceur, labspec, phurine, glucoseu, hgbur, bilirubinur, ketonesur, proteinur, urobilinogen, nitrite, leukocytesur    ----------------------------------------------------------------------------------------------------------------  Imaging results:   Ct Head [name]  12/28/2012  *RADIOLOGY REPORT*  Clinical Data: Non-Hodgkins lymphoma.  History of scalp lesion. Chemotherapy completed.  CT HEAD WITHOUT AND WITH CONTRAST  Technique:  Contiguous axial images were obtained from the base of the skull through the vertex without and with intravenous contrast.  Contrast: OMNIPAQUE IOHEXOL 300 MG/ML  SOLN  Comparison: Prior CTs 06/30/2012 and 01/04/2012.  Findings: There is no evidence of acute intracranial hemorrhage, mass lesion, brain edema or extra-axial fluid collection.  The ventricles and subarachnoid spaces are appropriately sized for age. There is no CT evidence of acute cortical infarction.  Postcontrast images demonstrate no abnormal intracranial  enhancement.  The visualized paranasal sinuses are clear. The calvarium is intact. There is no significant residual left temporal scalp lesion.  IMPRESSION: Stable examination.  No evidence of local recurrence or metastatic disease.   Original Report Authenticated By: [name], M.D.    Ct Chest W Contrast  12/28/2012  *RADIOLOGY REPORT*  Clinical Data:  Non-Hodgkins lymphoma.  Staging scan.  CT CHEST, ABDOMEN AND PELVIS WITH CONTRAST  Technique:  Multidetector CT imaging of the chest, abdomen and pelvis was performed following the standard protocol during bolus administration of intravenous  contrast.  Contrast: OMNIPAQUE IOHEXOL 300 MG/ML  SOLN  Comparison:   None.  CT CHEST  Findings:  Mediastinum: There are numerous filling defects throughout the pulmonary arterial tree in the lungs bilaterally, with the largest defects extending from the distal right main pulmonary artery into lobar, segmental and subsegmental sized branches throughout the right lung.  These defects appear to be predominately nonobstructive at this time.  Heart size is normal.  There is currently no dilatation of the pulmonic trunk or right heart to strongly suggest the presence of pulmonary arterial hypertension or right-sided heart strain. There is no significant pericardial fluid, thickening or pericardial calcification.  Right hilar lymphadenopathy and numerous prominent anterior mediastinal lymph nodes are very similar to the prior examination, with the right hilar lymph node currently measuring up to 18 mm in short axis, and very long anterior mediastinal lymph nodes measuring up to 1.2 x 3.5 cm.  Low right paratracheal lymph node is also very similar measuring up to 14 mm in short axis.  Esophagus is unremarkable in appearance.  Lungs/Pleura: There are currently no areas of airspace consolidation within the lungs to suggest hemorrhage from pulmonary infarction.  No pleural effusions.  4 mm nodule along the minor fissure (image 34 series 7) is unchanged and likely to represent a small subpleural lymph node.  Multiple other tiny 1-2 mm nodules are seen throughout the periphery of the lungs bilaterally, similar to numerous prior examinations (nonspecific, but presumably benign).  No definite enlarging suspicious appearing pulmonary nodule or mass is noted.  Musculoskeletal: There are no aggressive appearing lytic or blastic lesions noted in the visualized portions of the skeleton.  IMPRESSION: 1.  Interval development of a large amount of pulmonary embolus bilaterally.  No current signs of pulmonary infarction, pulmonary  arterial hypertension or right-sided heart strain. 2.  The overall burden of right hilar and mediastinal lymphadenopathy is very similar to the prior examination, as detailed above. 3.  Additional incidental findings, as above.  Critical Value/emergent results were called by telephone at the time of interpretation on 12/28/2012 at 10:15 a.m. to Dr. Arbutus Ped, who verbally acknowledged these results.  CT ABDOMEN AND PELVIS  Findings:  Abdomen/Pelvis:  Numerous well-defined smoothly marginated non- enhancing low attenuation lesions are again seen throughout the liver, largest of which measures 3.2 cm in diameter in segment 7 compatible with cysts. There is a multi septated cystic lesion in segment 5 which is unchanged.  No new hepatic lesions are noted. The appearance of the gallbladder, pancreas, spleen, bilateral adrenal glands and right kidney is unremarkable.  Numerous subcentimeter low attenuation lesions in the left kidney remain too small to definitively characterize, however, these are similar to prior studies and likely represent small cysts.  Mild aneurysmal dilatation of the right common iliac artery (1.7 cm in diameter) and severe aneurysmal dilatation of the left common iliac artery (2.4 cm in diameter) is again noted.  The external iliac  arteries also appeared diffusely dilated measuring up to 14 mm in diameter bilaterally.  The abdominal aorta is normal in caliber.  No significant volume of ascites.  No pneumoperitoneum.  No pathologic distension of small bowel.  No definite pathologic lymphadenopathy identified within the abdomen or pelvis.  Normal appendix (retrocecal).  Musculoskeletal: There are no aggressive appearing lytic or blastic lesions noted in the visualized portions of the skeleton.  IMPRESSION: 1.  No findings to suggest lymphomatous involvement in the abdomen or pelvis. 2.  Multiple low attenuation hepatic and left renal lesions appear similar to prior studies and likely represent multiple  cysts. 3.  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 4.  Additional incidental findings, as above.   Original Report Authenticated By: Trudie Reed, M.D.    Ct Abdomen Pelvis W Contrast  12/28/2012  *RADIOLOGY REPORT*  Clinical Data:  Non-Hodgkins lymphoma.  Staging scan.  CT CHEST, ABDOMEN AND PELVIS WITH CONTRAST  Technique:  Multidetector CT imaging of the chest, abdomen and pelvis was performed following the standard protocol during bolus administration of intravenous contrast.  Contrast: OMNIPAQUE IOHEXOL 300 MG/ML  SOLN  Comparison:   None.  CT CHEST  Findings:  Mediastinum: There are numerous filling defects throughout the pulmonary arterial tree in the lungs bilaterally, with the largest defects extending from the distal right main pulmonary artery into lobar, segmental and subsegmental sized branches throughout the right lung.  These defects appear to be predominately nonobstructive at this time.  Heart size is normal.  There is currently no dilatation of the pulmonic trunk or right heart to strongly suggest the presence of pulmonary arterial hypertension or right-sided heart strain. There is no significant pericardial fluid, thickening or pericardial calcification.  Right hilar lymphadenopathy and numerous prominent anterior mediastinal lymph nodes are very similar to the prior examination, with the right hilar lymph node currently measuring up to 18 mm in short axis, and very long anterior mediastinal lymph nodes measuring up to 1.2 x 3.5 cm.  Low right paratracheal lymph node is also very similar measuring up to 14 mm in short axis.  Esophagus is unremarkable in appearance.  Lungs/Pleura: There are currently no areas of airspace consolidation within the lungs to suggest hemorrhage from pulmonary infarction.  No pleural effusions.  4 mm nodule along the minor fissure (image  34 series 7) is unchanged and likely to represent a small subpleural lymph node.  Multiple other tiny 1-2 mm nodules are seen throughout the periphery of the lungs bilaterally, similar to numerous prior examinations (nonspecific, but presumably benign).  No definite enlarging suspicious appearing pulmonary nodule or mass is noted.  Musculoskeletal: There are no aggressive appearing lytic or blastic lesions noted in the visualized portions of the skeleton.  IMPRESSION: 1.  Interval development of a large amount of pulmonary embolus bilaterally.  No current signs of pulmonary infarction, pulmonary arterial hypertension or right-sided heart strain. 2.  The overall burden of right hilar and mediastinal lymphadenopathy is very similar to the prior examination, as detailed above. 3.  Additional incidental findings, as above.  Critical Value/emergent results were called by telephone at the time of interpretation on 12/28/2012 at 10:15 a.m. to Dr. Arbutus Ped, who verbally acknowledged these results.  CT ABDOMEN AND PELVIS  Findings:  Abdomen/Pelvis:  Numerous well-defined smoothly marginated non- enhancing low attenuation lesions are again seen throughout the liver, largest of which measures 3.2 cm  in diameter in segment 7 compatible with cysts. There is a multi septated cystic lesion in segment 5 which is unchanged.  No new hepatic lesions are noted. The appearance of the gallbladder, pancreas, spleen, bilateral adrenal glands and right kidney is unremarkable.  Numerous subcentimeter low attenuation lesions in the left kidney remain too small to definitively characterize, however, these are similar to prior studies and likely represent small cysts.  Mild aneurysmal dilatation of the right common iliac artery (1.7 cm in diameter) and severe aneurysmal dilatation of the left common iliac artery (2.4 cm in diameter) is again noted.  The external iliac arteries also appeared diffusely dilated measuring up to 14 mm in diameter  bilaterally.  The abdominal aorta is normal in caliber.  No significant volume of ascites.  No pneumoperitoneum.  No pathologic distension of small bowel.  No definite pathologic lymphadenopathy identified within the abdomen or pelvis.  Normal appendix (retrocecal).  Musculoskeletal: There are no aggressive appearing lytic or blastic lesions noted in the visualized portions of the skeleton.  IMPRESSION: 1.  No findings to suggest lymphomatous involvement in the abdomen or pelvis. 2.  Multiple low attenuation hepatic and left renal lesions appear similar to prior studies and likely represent multiple cysts. 3.  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 4.  Additional incidental findings, as above.   Original Report Authenticated By: Trudie Reed, M.D.     My personal review of EKG: Rhythm NSR, Rate  79 /min,  no Acute ST changes    Assessment & Plan   1. Incidental finding of bilateral moderate to large PE - no RV strain noted on CT scan, patient not tachycardic on EKG or telemetry, symptom-free at rest, no swelling in lower extremities, no recent travel. His hypercoagulable due to his underlying malignancy. He will be admitted to the hospital, Lovenox per pharmacy, we'll request social work to evaluate him for xaralto, if he qualifies he could easily switch to xaralto and discharged home, have requested Dr. Arbutus Ped to evaluate him while he is in the hospital.   2. History of non-Hodgkin's lymphoma. Thought to be in remission. Dr. Arbutus Ped to evaluate in followup.   3. History of smoking and alcohol abuse, counseled to quit both, will place him on scheduled Librium along with when necessary IV Ativan per CIWA protocol, thiamine and folic acid replacement, nicotine patch.   4. Anemia of chronic disease, thrombocytopenia and leukopenia. Likely due to Hodgkin's lymphoma and also  due to his alcohol abuse. Clinically monitor, any interventions per Dr. Arbutus Ped.   5. Incidental finding of pelvic artery aneurysms, liver and renal cysts, Lung nodules. Outpatient followup by PCP, pulmonary and vascular surgeon.   DVT Prophylaxis    Lovenox    AM Labs Ordered, also please review Full Orders  Family Communication: Admission, patients condition and plan of care including tests being ordered have been discussed with the patient   who indicates understanding and agree with the plan and Code Status.  Code Status Full  Likely DC to Home  Time spent in minutes : 35  Condition Marinell Blight K M.D on 12/28/2012 at 11:45 AM  Between 7am to 7pm - Pager - [phone]  After 7pm go to [email or web address] - password [email or web address]  And look for the night coverage person covering me after hours  [facility] Office  [phone]

## 2012-12-28 NOTE — Telephone Encounter (Signed)
Pt was seen for CT and has PE - I called pt and instructed him to go to [facility] for evaluation. He denies chest pain but does report SOB

## 2012-12-28 NOTE — Progress Notes (Signed)
ANTICOAGULATION CONSULT NOTE - Initial Consult  Pharmacy Consult for Lovenox Indication: pulmonary embolus  No Known Allergies  Patient Measurements:   Heparin Dosing Weight:  RN reports Weight 180 lbs, Height 5'9"  Vital Signs: Temp: 97.5 F (36.4 C) (02/20 1039) Temp src: Oral (02/20 1039) BP: 138/91 mmHg (02/20 1039) Pulse Rate: 82 (02/20 1045)  Labs:  Recent Labs  12/28/12 0824  HGB 12.7*  HCT 38.1*  PLT 116*  CREATININE 1.1    The CrCl is unknown because both a height and weight (above a minimum accepted value) are required for this calculation.   Medical History: Past Medical History  Diagnosis Date  . ED (erectile dysfunction)   . Cancer 07/2011    STAGE II NON- HODGKIN LYMPHOMA  . Lymphoma, non Hodgkin's 09/25/2011    Medications:  Scheduled:  . [DISCONTINUED] rivaroxaban  15 mg Oral Daily   Infusions:    Assessment:  52 yom with history of lymphoma.  During CT scan 2/20 for cancer surveillance, found to have new bilateral pulmonary embolism.  SCr 1.1, CrCl (N) ~ 79 ml/min  CBC shows Hgb 12.7 and Plt 116.  Platelets are chronically low/normal, and pt has hx of thrombocytopenia (with chemo 11/2011).   Goal of Therapy:  Anti-Xa level 0.6-1.2 units/ml 4hrs after LMWH dose given Monitor platelets by anticoagulation protocol: Yes   Plan:   Lovenox 80mg  SQ q12h  Follow up SCr and CBC while on Lovenox.  Lynann Beaver PharmD, BCPS Pager [phone] 12/28/2012 11:34 AM

## 2012-12-28 NOTE — Progress Notes (Signed)
VASCULAR LAB PRELIMINARY  PRELIMINARY  PRELIMINARY  PRELIMINARY  Bilateral lower extremity venous duplex  completed.    Preliminary report:  Right:  DVT noted in the proximal PTV .  No evidence of superficial thrombosis.  No Baker's cyst.  Left:  No evidence of DVT, superficial thrombosis, or Baker's cyst.  Ozell Ferrera, RVT 12/28/2012, 2:30 PM

## 2012-12-28 NOTE — Care Management Note (Signed)
    Page 1 of 1   01/01/2013     11:30:53 AM   CARE MANAGEMENT NOTE 01/01/2013  Patient:  Leonard Bentley,Leonard Bentley   Account Number:  [id number]  Date Initiated:  12/28/2012  Documentation initiated by:  Lanier Clam  Subjective/Objective Assessment:   ADMITTED W/BILAT PULMONARY EMBOLISM.HX:DVT,NON HODGKIN'S LYMPHOMA.     Action/Plan:   FROM HOME.HAS PCP,PHARMACY.   Anticipated DC Date:  01/01/2013   Anticipated DC Plan:  HOME/SELF CARE      DC Planning Services  CM consult      Choice offered to / List presented to:             Status of service:  Completed, signed off Medicare Important Message given?   (If response is "NO", the following Medicare IM given date fields will be blank) Date Medicare IM given:   Date Additional Medicare IM given:    Discharge Disposition:  HOME/SELF CARE  Per UR Regulation:  Reviewed for med. necessity/level of care/duration of stay  If discussed at Long Length of Stay Meetings, dates discussed:    Comments:  01/01/13 Faylinn Schwenn RN,BSN NCM [phone] D/C HOME NO NEEDS. 12/29/12 Victorian Gunn RN,BSN NCM [phone] CHECKED BENEFIT FOR XARELTO-CO-PAY $50.00 FOR 30DAY SUPPLY.LOVENOX $68.00 CO PAY.GENERIC LOVENOX-$10 CO PAY.

## 2012-12-29 DIAGNOSIS — D649 Anemia, unspecified: Secondary | ICD-10-CM

## 2012-12-29 LAB — CBC
HCT: 33.2 % — ABNORMAL LOW (ref 39.0–52.0)
Hemoglobin: 11 g/dL — ABNORMAL LOW (ref 13.0–17.0)
MCH: 26.3 pg (ref 26.0–34.0)
MCHC: 33.1 g/dL (ref 30.0–36.0)
MCV: 79.2 fL (ref 78.0–100.0)
Platelets: ADEQUATE 10*3/uL (ref 150–400)
RBC: 4.19 MIL/uL — ABNORMAL LOW (ref 4.22–5.81)
RDW: 14.1 % (ref 11.5–15.5)
WBC: 2.3 10*3/uL — ABNORMAL LOW (ref 4.0–10.5)

## 2012-12-29 LAB — BASIC METABOLIC PANEL
BUN: 16 mg/dL (ref 6–23)
CO2: 28 mEq/L (ref 19–32)
Calcium: 8.3 mg/dL — ABNORMAL LOW (ref 8.4–10.5)
Chloride: 100 mEq/L (ref 96–112)
Creatinine, Ser: 0.86 mg/dL (ref 0.50–1.35)
GFR calc Af Amer: >90 mL/min (ref >90)
GFR calc non Af Amer: >90 mL/min (ref >90)
Glucose, Bld: 96 mg/dL (ref 70–99)
Potassium: 4.1 mEq/L (ref 3.5–5.1)
Sodium: 136 mEq/L (ref 135–145)

## 2012-12-29 NOTE — Progress Notes (Signed)
[facility] PROGRESS NOTE  Leonard Bentley Leonard Bentley:[id number] DOB: [birth year] DOA: 12/28/2012 PCP: Carollee Herter, MD  Assessment/Plan: Pulmonary embolus -No RV strain on CT or EKG -Dr. Arbutus Ped was consulted to assist with recommendations of anticoagulation--rivaroxaban versus Coumadin -Remained hemodynamically stable Pancytopenia -Check iron studies -Anemia is microcytic -Continue current dose of ferrous sulfate for now Alcohol and tobacco abuse -Cessation discussed -Continue Librium -Nicoderm patch Non-Hodgkin's lymphoma -Patient has been on observation since January 2013 -Status post 6 cycles of CHOP and Rituxan    Family Communication:   Pt at beside Disposition Plan:   Home when medically stable     Procedures/Studies: Ct Head [name]  12/28/2012  *RADIOLOGY REPORT*  Clinical Data: Non-Hodgkins lymphoma.  History of scalp lesion. Chemotherapy completed.  CT HEAD WITHOUT AND WITH CONTRAST  Technique:  Contiguous axial images were obtained from the base of the skull through the vertex without and with intravenous contrast.  Contrast: OMNIPAQUE IOHEXOL 300 MG/ML  SOLN  Comparison: Prior CTs 06/30/2012 and 01/04/2012.  Findings: There is no evidence of acute intracranial hemorrhage, mass lesion, brain edema or extra-axial fluid collection.  The ventricles and subarachnoid spaces are appropriately sized for age. There is no CT evidence of acute cortical infarction.  Postcontrast images demonstrate no abnormal intracranial enhancement.  The visualized paranasal sinuses are clear. The calvarium is intact. There is no significant residual left temporal scalp lesion.  IMPRESSION: Stable examination.  No evidence of local recurrence or metastatic disease.   Original Report Authenticated By: Leonard Bentley, M.D.    Ct Chest W Contrast  12/28/2012  *RADIOLOGY REPORT*  Clinical Data:  Non-Hodgkins lymphoma.  Staging scan.  CT CHEST, ABDOMEN AND PELVIS WITH CONTRAST   Technique:  Multidetector CT imaging of the chest, abdomen and pelvis was performed following the standard protocol during bolus administration of intravenous contrast.  Contrast: OMNIPAQUE IOHEXOL 300 MG/ML  SOLN  Comparison:   None.  CT CHEST  Findings:  Mediastinum: There are numerous filling defects throughout the pulmonary arterial tree in the lungs bilaterally, with the largest defects extending from the distal right main pulmonary artery into lobar, segmental and subsegmental sized branches throughout the right lung.  These defects appear to be predominately nonobstructive at this time.  Heart size is normal.  There is currently no dilatation of the pulmonic trunk or right heart to strongly suggest the presence of pulmonary arterial hypertension or right-sided heart strain. There is no significant pericardial fluid, thickening or pericardial calcification.  Right hilar lymphadenopathy and numerous prominent anterior mediastinal lymph nodes are very similar to the prior examination, with the right hilar lymph node currently measuring up to 18 mm in short axis, and very long anterior mediastinal lymph nodes measuring up to 1.2 x 3.5 cm.  Low right paratracheal lymph node is also very similar measuring up to 14 mm in short axis.  Esophagus is unremarkable in appearance.  Lungs/Pleura: There are currently no areas of airspace consolidation within the lungs to suggest hemorrhage from pulmonary infarction.  No pleural effusions.  4 mm nodule along the minor fissure (image 34 series 7) is unchanged and likely to represent a small subpleural lymph node.  Multiple other tiny 1-2 mm nodules are seen throughout the periphery of the lungs bilaterally, similar to numerous prior examinations (nonspecific, but presumably benign).  No definite enlarging suspicious appearing pulmonary nodule or mass is noted.  Musculoskeletal: There are no aggressive appearing lytic or blastic lesions noted in the visualized portions of  the skeleton.  IMPRESSION: 1.  Interval development of a large amount of pulmonary embolus bilaterally.  No current signs of pulmonary infarction, pulmonary arterial hypertension or right-sided heart strain. 2.  The overall burden of right hilar and mediastinal lymphadenopathy is very similar to the prior examination, as detailed above. 3.  Additional incidental findings, as above.  Critical Value/emergent results were called by telephone at the time of interpretation on 12/28/2012 at 10:15 a.m. to Dr. Arbutus Ped, who verbally acknowledged these results.  CT ABDOMEN AND PELVIS  Findings:  Abdomen/Pelvis:  Numerous well-defined smoothly marginated non- enhancing low attenuation lesions are again seen throughout the liver, largest of which measures 3.2 cm in diameter in segment 7 compatible with cysts. There is a multi septated cystic lesion in segment 5 which is unchanged.  No new hepatic lesions are noted. The appearance of the gallbladder, pancreas, spleen, bilateral adrenal glands and right kidney is unremarkable.  Numerous subcentimeter low attenuation lesions in the left kidney remain too small to definitively characterize, however, these are similar to prior studies and likely represent small cysts.  Mild aneurysmal dilatation of the right common iliac artery (1.7 cm in diameter) and severe aneurysmal dilatation of the left common iliac artery (2.4 cm in diameter) is again noted.  The external iliac arteries also appeared diffusely dilated measuring up to 14 mm in diameter bilaterally.  The abdominal aorta is normal in caliber.  No significant volume of ascites.  No pneumoperitoneum.  No pathologic distension of small bowel.  No definite pathologic lymphadenopathy identified within the abdomen or pelvis.  Normal appendix (retrocecal).  Musculoskeletal: There are no aggressive appearing lytic or blastic lesions noted in the visualized portions of the skeleton.  IMPRESSION: 1.  No findings to suggest lymphomatous  involvement in the abdomen or pelvis. 2.  Multiple low attenuation hepatic and left renal lesions appear similar to prior studies and likely represent multiple cysts. 3.  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 4.  Additional incidental findings, as above.   Original Report Authenticated By: Trudie Reed, M.D.    Ct Abdomen Pelvis W Contrast  12/28/2012  *RADIOLOGY REPORT*  Clinical Data:  Non-Hodgkins lymphoma.  Staging scan.  CT CHEST, ABDOMEN AND PELVIS WITH CONTRAST  Technique:  Multidetector CT imaging of the chest, abdomen and pelvis was performed following the standard protocol during bolus administration of intravenous contrast.  Contrast: OMNIPAQUE IOHEXOL 300 MG/ML  SOLN  Comparison:   None.  CT CHEST  Findings:  Mediastinum: There are numerous filling defects throughout the pulmonary arterial tree in the lungs bilaterally, with the largest defects extending from the distal right main pulmonary artery into lobar, segmental and subsegmental sized branches throughout the right lung.  These defects appear to be predominately nonobstructive at this time.  Heart size is normal.  There is currently no dilatation of the pulmonic trunk or right heart to strongly suggest the presence of pulmonary arterial hypertension or right-sided heart strain. There is no significant pericardial fluid, thickening or pericardial calcification.  Right hilar lymphadenopathy and numerous prominent anterior mediastinal lymph nodes are very similar to the prior examination, with the right hilar lymph node currently measuring up to 18 mm in short axis, and very long anterior mediastinal lymph nodes measuring up to 1.2 x 3.5 cm.  Low right paratracheal lymph node is also very similar measuring up to 14 mm in short axis.  Esophagus is unremarkable in appearance.  Lungs/Pleura: There are currently no areas  of airspace consolidation within the lungs to suggest hemorrhage from pulmonary infarction.  No pleural effusions.  4 mm nodule along the minor fissure (image 34 series 7) is unchanged and likely to represent a small subpleural lymph node.  Multiple other tiny 1-2 mm nodules are seen throughout the periphery of the lungs bilaterally, similar to numerous prior examinations (nonspecific, but presumably benign).  No definite enlarging suspicious appearing pulmonary nodule or mass is noted.  Musculoskeletal: There are no aggressive appearing lytic or blastic lesions noted in the visualized portions of the skeleton.  IMPRESSION: 1.  Interval development of a large amount of pulmonary embolus bilaterally.  No current signs of pulmonary infarction, pulmonary arterial hypertension or right-sided heart strain. 2.  The overall burden of right hilar and mediastinal lymphadenopathy is very similar to the prior examination, as detailed above. 3.  Additional incidental findings, as above.  Critical Value/emergent results were called by telephone at the time of interpretation on 12/28/2012 at 10:15 a.m. to Dr. Arbutus Ped, who verbally acknowledged these results.  CT ABDOMEN AND PELVIS  Findings:  Abdomen/Pelvis:  Numerous well-defined smoothly marginated non- enhancing low attenuation lesions are again seen throughout the liver, largest of which measures 3.2 cm in diameter in segment 7 compatible with cysts. There is a multi septated cystic lesion in segment 5 which is unchanged.  No new hepatic lesions are noted. The appearance of the gallbladder, pancreas, spleen, bilateral adrenal glands and right kidney is unremarkable.  Numerous subcentimeter low attenuation lesions in the left kidney remain too small to definitively characterize, however, these are similar to prior studies and likely represent small cysts.  Mild aneurysmal dilatation of the right common iliac artery (1.7 cm in diameter) and severe aneurysmal dilatation of the  left common iliac artery (2.4 cm in diameter) is again noted.  The external iliac arteries also appeared diffusely dilated measuring up to 14 mm in diameter bilaterally.  The abdominal aorta is normal in caliber.  No significant volume of ascites.  No pneumoperitoneum.  No pathologic distension of small bowel.  No definite pathologic lymphadenopathy identified within the abdomen or pelvis.  Normal appendix (retrocecal).  Musculoskeletal: There are no aggressive appearing lytic or blastic lesions noted in the visualized portions of the skeleton.  IMPRESSION: 1.  No findings to suggest lymphomatous involvement in the abdomen or pelvis. 2.  Multiple low attenuation hepatic and left renal lesions appear similar to prior studies and likely represent multiple cysts. 3.  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 4.  Additional incidental findings, as above.   Original Report Authenticated By: Trudie Reed, M.D.          Subjective: Patient denies fevers, chills, chest pain, shortness breath, nausea, vomiting, diarrhea, dizziness, headache, dysuria, hematuria, rashes.  Objective: Filed Vitals:   12/29/12 0510 12/29/12 1300 12/29/12 1414 12/29/12 1500  BP: 132/92 154/80 125/101 128/92  Pulse: 71 72 77   Temp: 97.6 F (36.4 C)  97.6 F (36.4 C)   TempSrc: Oral  Oral   Resp: 16  18   Height: 5\' 10"  (1.778 m)     Weight: 82.4 kg (181 lb 10.5 oz)     SpO2: 100%  100%     Intake/Output Summary (Last 24 hours) at 12/29/12 1628 Last data filed at 12/29/12 0500  Gross per 24 hour  Intake      0 ml  Output    575 ml  Net   -575 ml   Weight change:  Exam:   General:  Pt is alert, follows commands appropriately, not in acute distress  HEENT: No icterus, No thrush,  [place]/AT  Cardiovascular: RRR, S1/S2, no rubs, no gallops  Respiratory: CTA bilaterally, no wheezing, no  crackles, no rhonchi  Abdomen: Soft/+BS, non tender, non distended, no guarding  Extremities: No edema, No lymphangitis, No petechiae, No rashes, no synovitis  Data Reviewed: Basic Metabolic Panel:  Recent Labs Lab 12/28/12 0824 12/28/12 1105 12/29/12 0446  NA 141 137 136  K 4.1 4.1 4.1  CL 98 96 100  CO2 30* 27 28  GLUCOSE 87 110* 96  BUN 19.9 19 16   CREATININE 1.1 0.94 0.86  CALCIUM 9.4 8.7 8.3*   Liver Function Tests:  Recent Labs Lab 12/28/12 0824 12/28/12 1105  AST 210* 191*  ALT 132* 115*  ALKPHOS 61 57  BILITOT 1.17 1.0  PROT 6.8 6.8  ALBUMIN 3.4* 3.3*   No results found for this basename: LIPASE, AMYLASE,  in the last 168 hours No results found for this basename: AMMONIA,  in the last 168 hours CBC:  Recent Labs Lab 12/28/12 0824 12/28/12 1105 12/29/12 0446  WBC 2.1* 2.4* 2.3*  NEUTROABS 1.2* 1.4*  --   HGB 12.7* 12.1* 11.0*  HCT 38.1* 35.9* 33.2*  MCV 78.1* 77.9* 79.2  PLT 116* 125* PLATELET CLUMPS NOTED ON SMEAR, COUNT APPEARS ADEQUATE   Cardiac Enzymes: No results found for this basename: CKTOTAL, CKMB, CKMBINDEX, TROPONINI,  in the last 168 hours BNP: No components found with this basename: POCBNP,  CBG: No results found for this basename: GLUCAP,  in the last 168 hours  No results found for this or any previous visit (from the past 240 hour(s)).   Scheduled Meds: . chlordiazePOXIDE  10 mg Oral TID  . enoxaparin (LOVENOX) injection  1 mg/kg Subcutaneous Q12H  . ferrous sulfate  325 mg Oral Q breakfast  . folic acid  1 mg Oral Daily  . gabapentin  300 mg Oral TID  . multivitamin with minerals  1 tablet Oral Daily  . nicotine  21 mg Transdermal Daily  . sodium chloride  3 mL Intravenous Q12H  . thiamine  100 mg Oral Daily   Or  . thiamine  100 mg Intravenous Daily   Continuous Infusions:    Rodriquez Thorner, DO  [facility] Pager [phone]  If 7PM-7AM, please contact night-coverage [email or web address] Password [facility] 12/29/2012,  4:28 PM   LOS: 1 [place]

## 2012-12-30 DIAGNOSIS — F10239 Alcohol dependence with withdrawal, unspecified: Secondary | ICD-10-CM

## 2012-12-30 DIAGNOSIS — R7402 Elevation of levels of lactic acid dehydrogenase (LDH): Secondary | ICD-10-CM

## 2012-12-30 DIAGNOSIS — R7401 Elevation of levels of liver transaminase levels: Secondary | ICD-10-CM

## 2012-12-30 LAB — HEPATIC FUNCTION PANEL
ALT: 88 U/L — ABNORMAL HIGH (ref 0–53)
AST: 110 U/L — ABNORMAL HIGH (ref 0–37)
Albumin: 3.6 g/dL (ref 3.5–5.2)
Alkaline Phosphatase: 55 U/L (ref 39–117)
Bilirubin, Direct: 0.3 mg/dL (ref 0.0–0.3)
Indirect Bilirubin: 0.6 mg/dL (ref 0.3–0.9)
Total Bilirubin: 0.9 mg/dL (ref 0.3–1.2)
Total Protein: 6.7 g/dL (ref 6.0–8.3)

## 2012-12-30 MED ORDER — LORAZEPAM 2 MG/ML IJ SOLN
1.0000 mg | Freq: Once | INTRAMUSCULAR | Status: AC
  Administered 2012-12-30: 1 mg via INTRAVENOUS
  Filled 2012-12-30: qty 1

## 2012-12-30 MED ORDER — RIVAROXABAN 15 MG PO TABS
15.0000 mg | ORAL_TABLET | Freq: Two times a day (BID) | ORAL | Status: DC
  Administered 2012-12-30 – 2013-01-01 (×5): 15 mg via ORAL
  Filled 2012-12-30 (×9): qty 1

## 2012-12-30 MED ORDER — CHLORDIAZEPOXIDE HCL 5 MG PO CAPS
10.0000 mg | ORAL_CAPSULE | Freq: Two times a day (BID) | ORAL | Status: DC
  Filled 2012-12-30: qty 2

## 2012-12-30 MED ORDER — CHLORDIAZEPOXIDE HCL 5 MG PO CAPS
10.0000 mg | ORAL_CAPSULE | Freq: Three times a day (TID) | ORAL | Status: DC
  Administered 2012-12-30 – 2012-12-31 (×4): 10 mg via ORAL
  Filled 2012-12-30 (×3): qty 2

## 2012-12-30 NOTE — Progress Notes (Signed)
Pt's HR was sustaining in 140's, per CMT. Pt was up and walking around room, asymptomatic upon assessment. Pt sat down and HR went down to upper 90's. Notified MD of event. Will continue to monitor pt. - Christell Faith, RN

## 2012-12-30 NOTE — Progress Notes (Signed)
Patient alert, follows commands but restless walking around, hand tremors noted PRN ativan and scheduled librium given. Elevated heart rate with activity. Bed alarm in use for safety.

## 2012-12-30 NOTE — Progress Notes (Signed)
[facility] PROGRESS NOTE  Lem Peary Bunker ZOX:[id number] DOB: Nov 29, 1960 DOA: 12/28/2012 PCP: Carollee Herter, MD  Assessment/Plan: Pulmonary embolus  -No RV strain on CT or EKG  -Discussed with Dr. Myna Hidalgo today - Discontinue Lovenox - Start rivaroxaban -Remained hemodynamically stable  Pancytopenia   -Check iron studies  -Anemia is microcytic  -Continue current dose of ferrous sulfate for now  Alcohol withdrawal - Patient had anxiety and tachycardia last night, required one dose of Ativan this morning - Continue Librium Alcohol and tobacco abuse  -Cessation discussed  -Continue Librium  -Nicoderm patch  Transaminasemia -due to acute Etoh ingestion Non-Hodgkin's lymphoma  -Patient has been on observation since January 2013  -Status post 6 cycles of CHOP and Rituxan  Family Communication: Pt at beside  Disposition Plan: Home when medically stable        Procedures/Studies: Ct Head [name]  12/28/2012  *RADIOLOGY REPORT*  Clinical Data: Non-Hodgkins lymphoma.  History of scalp lesion. Chemotherapy completed.  CT HEAD WITHOUT AND WITH CONTRAST  Technique:  Contiguous axial images were obtained from the base of the skull through the vertex without and with intravenous contrast.  Contrast: OMNIPAQUE IOHEXOL 300 MG/ML  SOLN  Comparison: Prior CTs 06/30/2012 and 01/04/2012.  Findings: There is no evidence of acute intracranial hemorrhage, mass lesion, brain edema or extra-axial fluid collection.  The ventricles and subarachnoid spaces are appropriately sized for age. There is no CT evidence of acute cortical infarction.  Postcontrast images demonstrate no abnormal intracranial enhancement.  The visualized paranasal sinuses are clear. The calvarium is intact. There is no significant residual left temporal scalp lesion.  IMPRESSION: Stable examination.  No evidence of local recurrence or metastatic disease.   Original Report Authenticated By: [name], M.D.     Ct Chest W Contrast  12/28/2012  *RADIOLOGY REPORT*  Clinical Data:  Non-Hodgkins lymphoma.  Staging scan.  CT CHEST, ABDOMEN AND PELVIS WITH CONTRAST  Technique:  Multidetector CT imaging of the chest, abdomen and pelvis was performed following the standard protocol during bolus administration of intravenous contrast.  Contrast: OMNIPAQUE IOHEXOL 300 MG/ML  SOLN  Comparison:   None.  CT CHEST  Findings:  Mediastinum: There are numerous filling defects throughout the pulmonary arterial tree in the lungs bilaterally, with the largest defects extending from the distal right main pulmonary artery into lobar, segmental and subsegmental sized branches throughout the right lung.  These defects appear to be predominately nonobstructive at this time.  Heart size is normal.  There is currently no dilatation of the pulmonic trunk or right heart to strongly suggest the presence of pulmonary arterial hypertension or right-sided heart strain. There is no significant pericardial fluid, thickening or pericardial calcification.  Right hilar lymphadenopathy and numerous prominent anterior mediastinal lymph nodes are very similar to the prior examination, with the right hilar lymph node currently measuring up to 18 mm in short axis, and very long anterior mediastinal lymph nodes measuring up to 1.2 x 3.5 cm.  Low right paratracheal lymph node is also very similar measuring up to 14 mm in short axis.  Esophagus is unremarkable in appearance.  Lungs/Pleura: There are currently no areas of airspace consolidation within the lungs to suggest hemorrhage from pulmonary infarction.  No pleural effusions.  4 mm nodule along the minor fissure (image 34 series 7) is unchanged and likely to represent a small subpleural lymph node.  Multiple other tiny 1-2 mm nodules are seen throughout the periphery of the lungs bilaterally, similar to  numerous prior examinations (nonspecific, but presumably benign).  No definite enlarging suspicious  appearing pulmonary nodule or mass is noted.  Musculoskeletal: There are no aggressive appearing lytic or blastic lesions noted in the visualized portions of the skeleton.  IMPRESSION: 1.  Interval development of a large amount of pulmonary embolus bilaterally.  No current signs of pulmonary infarction, pulmonary arterial hypertension or right-sided heart strain. 2.  The overall burden of right hilar and mediastinal lymphadenopathy is very similar to the prior examination, as detailed above. 3.  Additional incidental findings, as above.  Critical Value/emergent results were called by telephone at the time of interpretation on 12/28/2012 at 10:15 a.m. to Dr. Arbutus Ped, who verbally acknowledged these results.  CT ABDOMEN AND PELVIS  Findings:  Abdomen/Pelvis:  Numerous well-defined smoothly marginated non- enhancing low attenuation lesions are again seen throughout the liver, largest of which measures 3.2 cm in diameter in segment 7 compatible with cysts. There is a multi septated cystic lesion in segment 5 which is unchanged.  No new hepatic lesions are noted. The appearance of the gallbladder, pancreas, spleen, bilateral adrenal glands and right kidney is unremarkable.  Numerous subcentimeter low attenuation lesions in the left kidney remain too small to definitively characterize, however, these are similar to prior studies and likely represent small cysts.  Mild aneurysmal dilatation of the right common iliac artery (1.7 cm in diameter) and severe aneurysmal dilatation of the left common iliac artery (2.4 cm in diameter) is again noted.  The external iliac arteries also appeared diffusely dilated measuring up to 14 mm in diameter bilaterally.  The abdominal aorta is normal in caliber.  No significant volume of ascites.  No pneumoperitoneum.  No pathologic distension of small bowel.  No definite pathologic lymphadenopathy identified within the abdomen or pelvis.  Normal appendix (retrocecal).  Musculoskeletal: There  are no aggressive appearing lytic or blastic lesions noted in the visualized portions of the skeleton.  IMPRESSION: 1.  No findings to suggest lymphomatous involvement in the abdomen or pelvis. 2.  Multiple low attenuation hepatic and left renal lesions appear similar to prior studies and likely represent multiple cysts. 3.  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 4.  Additional incidental findings, as above.   Original Report Authenticated By: Trudie Reed, M.D.    Ct Abdomen Pelvis W Contrast  12/28/2012  *RADIOLOGY REPORT*  Clinical Data:  Non-Hodgkins lymphoma.  Staging scan.  CT CHEST, ABDOMEN AND PELVIS WITH CONTRAST  Technique:  Multidetector CT imaging of the chest, abdomen and pelvis was performed following the standard protocol during bolus administration of intravenous contrast.  Contrast: OMNIPAQUE IOHEXOL 300 MG/ML  SOLN  Comparison:   None.  CT CHEST  Findings:  Mediastinum: There are numerous filling defects throughout the pulmonary arterial tree in the lungs bilaterally, with the largest defects extending from the distal right main pulmonary artery into lobar, segmental and subsegmental sized branches throughout the right lung.  These defects appear to be predominately nonobstructive at this time.  Heart size is normal.  There is currently no dilatation of the pulmonic trunk or right heart to strongly suggest the presence of pulmonary arterial hypertension or right-sided heart strain. There is no significant pericardial fluid, thickening or pericardial calcification.  Right hilar lymphadenopathy and numerous prominent anterior mediastinal lymph nodes are very similar to the prior examination, with the right hilar lymph node currently measuring up to 18  mm in short axis, and very long anterior mediastinal lymph nodes measuring up to 1.2 x 3.5 cm.  Low right  paratracheal lymph node is also very similar measuring up to 14 mm in short axis.  Esophagus is unremarkable in appearance.  Lungs/Pleura: There are currently no areas of airspace consolidation within the lungs to suggest hemorrhage from pulmonary infarction.  No pleural effusions.  4 mm nodule along the minor fissure (image 34 series 7) is unchanged and likely to represent a small subpleural lymph node.  Multiple other tiny 1-2 mm nodules are seen throughout the periphery of the lungs bilaterally, similar to numerous prior examinations (nonspecific, but presumably benign).  No definite enlarging suspicious appearing pulmonary nodule or mass is noted.  Musculoskeletal: There are no aggressive appearing lytic or blastic lesions noted in the visualized portions of the skeleton.  IMPRESSION: 1.  Interval development of a large amount of pulmonary embolus bilaterally.  No current signs of pulmonary infarction, pulmonary arterial hypertension or right-sided heart strain. 2.  The overall burden of right hilar and mediastinal lymphadenopathy is very similar to the prior examination, as detailed above. 3.  Additional incidental findings, as above.  Critical Value/emergent results were called by telephone at the time of interpretation on 12/28/2012 at 10:15 a.m. to Dr. Arbutus Ped, who verbally acknowledged these results.  CT ABDOMEN AND PELVIS  Findings:  Abdomen/Pelvis:  Numerous well-defined smoothly marginated non- enhancing low attenuation lesions are again seen throughout the liver, largest of which measures 3.2 cm in diameter in segment 7 compatible with cysts. There is a multi septated cystic lesion in segment 5 which is unchanged.  No new hepatic lesions are noted. The appearance of the gallbladder, pancreas, spleen, bilateral adrenal glands and right kidney is unremarkable.  Numerous subcentimeter low attenuation lesions in the left kidney remain too small to definitively characterize, however, these are similar to  prior studies and likely represent small cysts.  Mild aneurysmal dilatation of the right common iliac artery (1.7 cm in diameter) and severe aneurysmal dilatation of the left common iliac artery (2.4 cm in diameter) is again noted.  The external iliac arteries also appeared diffusely dilated measuring up to 14 mm in diameter bilaterally.  The abdominal aorta is normal in caliber.  No significant volume of ascites.  No pneumoperitoneum.  No pathologic distension of small bowel.  No definite pathologic lymphadenopathy identified within the abdomen or pelvis.  Normal appendix (retrocecal).  Musculoskeletal: There are no aggressive appearing lytic or blastic lesions noted in the visualized portions of the skeleton.  IMPRESSION: 1.  No findings to suggest lymphomatous involvement in the abdomen or pelvis. 2.  Multiple low attenuation hepatic and left renal lesions appear similar to prior studies and likely represent multiple cysts. 3.  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 4.  Additional incidental findings, as above.   Original Report Authenticated By: Trudie Reed, M.D.          Subjective: Patient continues to feel anxious and tremulous, but is improved from the time of admission. Denies fevers, chills, chest pain, shortness breath, nausea, vomiting, diarrhea, abdominal pain, dysuria, hematuria  Objective: Filed Vitals:   12/29/12 1500 12/29/12 2025 12/30/12 0330 12/30/12 0410  BP: 128/92 126/109  116/82  Pulse:  85  100  Temp:  97.7 F (36.5 C)  98.3 F (36.8 C)  TempSrc:  Oral  Oral  Resp:  18  18  Height:   5\' 10"  (1.778 m)   Weight:   83.553 kg (184 lb 3.2 oz)   SpO2:  100%  100%    Intake/Output Summary (Last 24 hours) at 12/30/12 1024 Last data filed at 12/29/12 1806  Gross per 24 hour  Intake    480 ml  Output      0 ml  Net    480 ml   Weight change:  1.905 kg (4 lb 3.2 oz) Exam:   General:  Pt is alert, follows commands appropriately, not in acute distress  HEENT: No icterus, No thrush, No neck mass, [place]/AT  Cardiovascular: RRR, S1/S2, no rubs, no gallops  Respiratory: CTA bilaterally, no wheezing, no crackles, no rhonchi  Abdomen: Soft/+BS, non tender, non distended, no guarding  Extremities: No edema, No lymphangitis, No petechiae, No rashes, no synovitis  Data Reviewed: Basic Metabolic Panel:  Recent Labs Lab 12/28/12 0824 12/28/12 1105 12/29/12 0446  NA 141 137 136  K 4.1 4.1 4.1  CL 98 96 100  CO2 30* 27 28  GLUCOSE 87 110* 96  BUN 19.9 19 16   CREATININE 1.1 0.94 0.86  CALCIUM 9.4 8.7 8.3*   Liver Function Tests:  Recent Labs Lab 12/28/12 0824 12/28/12 1105 12/30/12 0823  AST 210* 191* 110*  ALT 132* 115* 88*  ALKPHOS 61 57 55  BILITOT 1.17 1.0 0.9  PROT 6.8 6.8 6.7  ALBUMIN 3.4* 3.3* 3.6   No results found for this basename: LIPASE, AMYLASE,  in the last 168 hours No results found for this basename: AMMONIA,  in the last 168 hours CBC:  Recent Labs Lab 12/28/12 0824 12/28/12 1105 12/29/12 0446  WBC 2.1* 2.4* 2.3*  NEUTROABS 1.2* 1.4*  --   HGB 12.7* 12.1* 11.0*  HCT 38.1* 35.9* 33.2*  MCV 78.1* 77.9* 79.2  PLT 116* 125* PLATELET CLUMPS NOTED ON SMEAR, COUNT APPEARS ADEQUATE   Cardiac Enzymes: No results found for this basename: CKTOTAL, CKMB, CKMBINDEX, TROPONINI,  in the last 168 hours BNP: No components found with this basename: POCBNP,  CBG: No results found for this basename: GLUCAP,  in the last 168 hours  No results found for this or any previous visit (from the past 240 hour(s)).   Scheduled Meds: . chlordiazePOXIDE  10 mg Oral TID  . ferrous sulfate  325 mg Oral Q breakfast  . folic acid  1 mg Oral Daily  . gabapentin  300 mg Oral TID  . multivitamin with minerals  1 tablet Oral Daily  . nicotine  21 mg Transdermal Daily  . rivaroxaban  15 mg Oral BID WC  . sodium  chloride  3 mL Intravenous Q12H  . thiamine  100 mg Oral Daily   Or  . thiamine  100 mg Intravenous Daily   Continuous Infusions:    Sarena Jezek, DO  [facility] Pager [phone]  If 7PM-7AM, please contact night-coverage [email or web address] Password [facility] 12/30/2012, 10:24 AM   LOS: 2 days

## 2012-12-31 LAB — CBC WITH DIFFERENTIAL/PLATELET
Basophils Absolute: 0 10*3/uL (ref 0.0–0.1)
Basophils Relative: 1 % (ref 0–1)
Eosinophils Absolute: 0.1 10*3/uL (ref 0.0–0.7)
Eosinophils Relative: 3 % (ref 0–5)
HCT: 32 % — ABNORMAL LOW (ref 39.0–52.0)
Hemoglobin: 10.4 g/dL — ABNORMAL LOW (ref 13.0–17.0)
Lymphocytes Relative: 27 % (ref 12–46)
Lymphs Abs: 0.7 10*3/uL (ref 0.7–4.0)
MCH: 25.9 pg — ABNORMAL LOW (ref 26.0–34.0)
MCHC: 32.5 g/dL (ref 30.0–36.0)
MCV: 79.8 fL (ref 78.0–100.0)
Monocytes Absolute: 0.4 10*3/uL (ref 0.1–1.0)
Monocytes Relative: 17 % — ABNORMAL HIGH (ref 3–12)
Neutro Abs: 1.3 10*3/uL — ABNORMAL LOW (ref 1.7–7.7)
Neutrophils Relative %: 52 % (ref 43–77)
Platelets: 127 10*3/uL — ABNORMAL LOW (ref 150–400)
RBC: 4.01 MIL/uL — ABNORMAL LOW (ref 4.22–5.81)
RDW: 13.9 % (ref 11.5–15.5)
WBC: 2.4 10*3/uL — ABNORMAL LOW (ref 4.0–10.5)

## 2012-12-31 LAB — BASIC METABOLIC PANEL
BUN: 10 mg/dL (ref 6–23)
CO2: 28 mEq/L (ref 19–32)
Calcium: 8.5 mg/dL (ref 8.4–10.5)
Chloride: 101 mEq/L (ref 96–112)
Creatinine, Ser: 0.87 mg/dL (ref 0.50–1.35)
GFR calc Af Amer: >90 mL/min (ref >90)
GFR calc non Af Amer: >90 mL/min (ref >90)
Glucose, Bld: 101 mg/dL — ABNORMAL HIGH (ref 70–99)
Potassium: 3.7 mEq/L (ref 3.5–5.1)
Sodium: 136 mEq/L (ref 135–145)

## 2012-12-31 NOTE — Progress Notes (Signed)
[facility] PROGRESS NOTE  Fabyan Loughmiller Burks KGM:[id number] DOB: 1961-08-29 DOA: 12/28/2012 PCP: Carollee Herter, MD  Assessment/Plan: Pulmonary embolus  -No RV strain on CT or EKG  -Discussed with Dr. Myna Hidalgo today  - Discontinue Lovenox  - Start rivaroxaban  -Remained hemodynamically stable  Pancytopenia  -Check iron studies  -Anemia is microcytic  -Continue current dose of ferrous sulfate for now  Alcohol withdrawal  - Patient had anxiety  last night, required one dose of Ativan-->CIWA was 8 last night - much improved this am -d/c librium and observe Alcohol and tobacco abuse  -Cessation discussed  -Continue Librium  -Nicoderm patch  Transaminasemia  -due to acute Etoh ingestion  -continues to improve -continue to monitor Non-Hodgkin's lymphoma  -Patient has been on observation since January 2013  -Status post 6 cycles of CHOP and Rituxan  Family Communication: Pt at beside  Disposition Plan: Home when medically stable         Procedures/Studies: Ct Head [name]  12/28/2012  *RADIOLOGY REPORT*  Clinical Data: Non-Hodgkins lymphoma.  History of scalp lesion. Chemotherapy completed.  CT HEAD WITHOUT AND WITH CONTRAST  Technique:  Contiguous axial images were obtained from the base of the skull through the vertex without and with intravenous contrast.  Contrast: OMNIPAQUE IOHEXOL 300 MG/ML  SOLN  Comparison: Prior CTs 06/30/2012 and 01/04/2012.  Findings: There is no evidence of acute intracranial hemorrhage, mass lesion, brain edema or extra-axial fluid collection.  The ventricles and subarachnoid spaces are appropriately sized for age. There is no CT evidence of acute cortical infarction.  Postcontrast images demonstrate no abnormal intracranial enhancement.  The visualized paranasal sinuses are clear. The calvarium is intact. There is no significant residual left temporal scalp lesion.  IMPRESSION: Stable examination.  No evidence of local recurrence or  metastatic disease.   Original Report Authenticated By: [name], M.D.    Ct Chest W Contrast  12/28/2012  *RADIOLOGY REPORT*  Clinical Data:  Non-Hodgkins lymphoma.  Staging scan.  CT CHEST, ABDOMEN AND PELVIS WITH CONTRAST  Technique:  Multidetector CT imaging of the chest, abdomen and pelvis was performed following the standard protocol during bolus administration of intravenous contrast.  Contrast: OMNIPAQUE IOHEXOL 300 MG/ML  SOLN  Comparison:   None.  CT CHEST  Findings:  Mediastinum: There are numerous filling defects throughout the pulmonary arterial tree in the lungs bilaterally, with the largest defects extending from the distal right main pulmonary artery into lobar, segmental and subsegmental sized branches throughout the right lung.  These defects appear to be predominately nonobstructive at this time.  Heart size is normal.  There is currently no dilatation of the pulmonic trunk or right heart to strongly suggest the presence of pulmonary arterial hypertension or right-sided heart strain. There is no significant pericardial fluid, thickening or pericardial calcification.  Right hilar lymphadenopathy and numerous prominent anterior mediastinal lymph nodes are very similar to the prior examination, with the right hilar lymph node currently measuring up to 18 mm in short axis, and very long anterior mediastinal lymph nodes measuring up to 1.2 x 3.5 cm.  Low right paratracheal lymph node is also very similar measuring up to 14 mm in short axis.  Esophagus is unremarkable in appearance.  Lungs/Pleura: There are currently no areas of airspace consolidation within the lungs to suggest hemorrhage from pulmonary infarction.  No pleural effusions.  4 mm nodule along the minor fissure (image 34 series 7) is unchanged and likely to represent a small subpleural lymph  node.  Multiple other tiny 1-2 mm nodules are seen throughout the periphery of the lungs bilaterally, similar to numerous prior  examinations (nonspecific, but presumably benign).  No definite enlarging suspicious appearing pulmonary nodule or mass is noted.  Musculoskeletal: There are no aggressive appearing lytic or blastic lesions noted in the visualized portions of the skeleton.  IMPRESSION: 1.  Interval development of a large amount of pulmonary embolus bilaterally.  No current signs of pulmonary infarction, pulmonary arterial hypertension or right-sided heart strain. 2.  The overall burden of right hilar and mediastinal lymphadenopathy is very similar to the prior examination, as detailed above. 3.  Additional incidental findings, as above.  Critical Value/emergent results were called by telephone at the time of interpretation on 12/28/2012 at 10:15 a.m. to Dr. Arbutus Ped, who verbally acknowledged these results.  CT ABDOMEN AND PELVIS  Findings:  Abdomen/Pelvis:  Numerous well-defined smoothly marginated non- enhancing low attenuation lesions are again seen throughout the liver, largest of which measures 3.2 cm in diameter in segment 7 compatible with cysts. There is a multi septated cystic lesion in segment 5 which is unchanged.  No new hepatic lesions are noted. The appearance of the gallbladder, pancreas, spleen, bilateral adrenal glands and right kidney is unremarkable.  Numerous subcentimeter low attenuation lesions in the left kidney remain too small to definitively characterize, however, these are similar to prior studies and likely represent small cysts.  Mild aneurysmal dilatation of the right common iliac artery (1.7 cm in diameter) and severe aneurysmal dilatation of the left common iliac artery (2.4 cm in diameter) is again noted.  The external iliac arteries also appeared diffusely dilated measuring up to 14 mm in diameter bilaterally.  The abdominal aorta is normal in caliber.  No significant volume of ascites.  No pneumoperitoneum.  No pathologic distension of small bowel.  No definite pathologic lymphadenopathy identified  within the abdomen or pelvis.  Normal appendix (retrocecal).  Musculoskeletal: There are no aggressive appearing lytic or blastic lesions noted in the visualized portions of the skeleton.  IMPRESSION: 1.  No findings to suggest lymphomatous involvement in the abdomen or pelvis. 2.  Multiple low attenuation hepatic and left renal lesions appear similar to prior studies and likely represent multiple cysts. 3.  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 4.  Additional incidental findings, as above.   Original Report Authenticated By: Trudie Reed, M.D.    Ct Abdomen Pelvis W Contrast  12/28/2012  *RADIOLOGY REPORT*  Clinical Data:  Non-Hodgkins lymphoma.  Staging scan.  CT CHEST, ABDOMEN AND PELVIS WITH CONTRAST  Technique:  Multidetector CT imaging of the chest, abdomen and pelvis was performed following the standard protocol during bolus administration of intravenous contrast.  Contrast: OMNIPAQUE IOHEXOL 300 MG/ML  SOLN  Comparison:   None.  CT CHEST  Findings:  Mediastinum: There are numerous filling defects throughout the pulmonary arterial tree in the lungs bilaterally, with the largest defects extending from the distal right main pulmonary artery into lobar, segmental and subsegmental sized branches throughout the right lung.  These defects appear to be predominately nonobstructive at this time.  Heart size is normal.  There is currently no dilatation of the pulmonic trunk or right heart to strongly suggest the presence of pulmonary arterial hypertension or right-sided heart strain. There is no significant pericardial fluid, thickening or pericardial calcification.  Right hilar lymphadenopathy and numerous prominent anterior mediastinal lymph  nodes are very similar to the prior examination, with the right hilar lymph node currently measuring up to 18 mm in short axis, and very long  anterior mediastinal lymph nodes measuring up to 1.2 x 3.5 cm.  Low right paratracheal lymph node is also very similar measuring up to 14 mm in short axis.  Esophagus is unremarkable in appearance.  Lungs/Pleura: There are currently no areas of airspace consolidation within the lungs to suggest hemorrhage from pulmonary infarction.  No pleural effusions.  4 mm nodule along the minor fissure (image 34 series 7) is unchanged and likely to represent a small subpleural lymph node.  Multiple other tiny 1-2 mm nodules are seen throughout the periphery of the lungs bilaterally, similar to numerous prior examinations (nonspecific, but presumably benign).  No definite enlarging suspicious appearing pulmonary nodule or mass is noted.  Musculoskeletal: There are no aggressive appearing lytic or blastic lesions noted in the visualized portions of the skeleton.  IMPRESSION: 1.  Interval development of a large amount of pulmonary embolus bilaterally.  No current signs of pulmonary infarction, pulmonary arterial hypertension or right-sided heart strain. 2.  The overall burden of right hilar and mediastinal lymphadenopathy is very similar to the prior examination, as detailed above. 3.  Additional incidental findings, as above.  Critical Value/emergent results were called by telephone at the time of interpretation on 12/28/2012 at 10:15 a.m. to Dr. Arbutus Ped, who verbally acknowledged these results.  CT ABDOMEN AND PELVIS  Findings:  Abdomen/Pelvis:  Numerous well-defined smoothly marginated non- enhancing low attenuation lesions are again seen throughout the liver, largest of which measures 3.2 cm in diameter in segment 7 compatible with cysts. There is a multi septated cystic lesion in segment 5 which is unchanged.  No new hepatic lesions are noted. The appearance of the gallbladder, pancreas, spleen, bilateral adrenal glands and right kidney is unremarkable.  Numerous subcentimeter low attenuation lesions in the left kidney remain  too small to definitively characterize, however, these are similar to prior studies and likely represent small cysts.  Mild aneurysmal dilatation of the right common iliac artery (1.7 cm in diameter) and severe aneurysmal dilatation of the left common iliac artery (2.4 cm in diameter) is again noted.  The external iliac arteries also appeared diffusely dilated measuring up to 14 mm in diameter bilaterally.  The abdominal aorta is normal in caliber.  No significant volume of ascites.  No pneumoperitoneum.  No pathologic distension of small bowel.  No definite pathologic lymphadenopathy identified within the abdomen or pelvis.  Normal appendix (retrocecal).  Musculoskeletal: There are no aggressive appearing lytic or blastic lesions noted in the visualized portions of the skeleton.  IMPRESSION: 1.  No findings to suggest lymphomatous involvement in the abdomen or pelvis. 2.  Multiple low attenuation hepatic and left renal lesions appear similar to prior studies and likely represent multiple cysts. 3.  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 4.  Additional incidental findings, as above.   Original Report Authenticated By: Trudie Reed, M.D.          Subjective: Patient required one dose of Ativan last night for CIWA score of 8.  Feeling better this morning. Less jittery. He was asleep when I entered room. Awoke easily. Denies fevers, chills, chest pain, shortness breath, nausea, vomiting, diarrhea, abdominal pain.  Objective: Filed Vitals:   12/31/12 0300 12/31/12 0441 12/31/12 0500 12/31/12 0849  BP:  130/96  118/82  Pulse: 89 63  85  Temp:  98.1 F (36.7 C)    TempSrc:  Oral    Resp:  20    Height:      Weight:   83.9 kg (184 lb 15.5 oz)   SpO2: 100% 100%      Intake/Output Summary (Last 24 hours) at 12/31/12 1151 Last data filed at 12/31/12 0557  Gross per 24 hour   Intake      0 ml  Output    350 ml  Net   -350 ml   Weight change: 0.348 kg (12.3 oz) Exam:   General:  Pt is alert, follows commands appropriately, not in acute distress  HEENT: No icterus, No thrush, [place]/AT  Cardiovascular: RRR, S1/S2, no rubs, no gallops  Respiratory: CTA bilaterally, no wheezing, no crackles, no rhonchi  Abdomen: Soft/+BS, non tender, non distended, no guarding  Extremities: No edema, No lymphangitis, No petechiae, No rashes, no synovitis  Data Reviewed: Basic Metabolic Panel:  Recent Labs Lab 12/28/12 0824 12/28/12 1105 12/29/12 0446 12/31/12 0535  NA 141 137 136 136  K 4.1 4.1 4.1 3.7  CL 98 96 100 101  CO2 30* 27 28 28   GLUCOSE 87 110* 96 101*  BUN 19.9 19 16 10   CREATININE 1.1 0.94 0.86 0.87  CALCIUM 9.4 8.7 8.3* 8.5   Liver Function Tests:  Recent Labs Lab 12/28/12 0824 12/28/12 1105 12/30/12 0823  AST 210* 191* 110*  ALT 132* 115* 88*  ALKPHOS 61 57 55  BILITOT 1.17 1.0 0.9  PROT 6.8 6.8 6.7  ALBUMIN 3.4* 3.3* 3.6   No results found for this basename: LIPASE, AMYLASE,  in the last 168 hours No results found for this basename: AMMONIA,  in the last 168 hours CBC:  Recent Labs Lab 12/28/12 0824 12/28/12 1105 12/29/12 0446 12/31/12 0535  WBC 2.1* 2.4* 2.3* 2.4*  NEUTROABS 1.2* 1.4*  --  1.3*  HGB 12.7* 12.1* 11.0* 10.4*  HCT 38.1* 35.9* 33.2* 32.0*  MCV 78.1* 77.9* 79.2 79.8  PLT 116* 125* PLATELET CLUMPS NOTED ON SMEAR, COUNT APPEARS ADEQUATE 127*   Cardiac Enzymes: No results found for this basename: CKTOTAL, CKMB, CKMBINDEX, TROPONINI,  in the last 168 hours BNP: No components found with this basename: POCBNP,  CBG: No results found for this basename: GLUCAP,  in the last 168 hours  No results found for this or any previous visit (from the past 240 hour(s)).   Scheduled Meds: . ferrous sulfate  325 mg Oral Q breakfast  . folic acid  1 mg Oral Daily  . gabapentin  300 mg Oral TID  . multivitamin with minerals   1 tablet Oral Daily  . nicotine  21 mg Transdermal Daily  . rivaroxaban  15 mg Oral BID WC  . sodium chloride  3 mL Intravenous Q12H  . thiamine  100 mg Oral Daily   Or  . thiamine  100 mg Intravenous Daily   Continuous Infusions:    Heman Que, DO  [facility] Pager [phone]  If 7PM-7AM, please contact night-coverage [email or web address] Password [facility] 12/31/2012, 11:51 AM   LOS: 3 days

## 2013-01-01 ENCOUNTER — Telehealth: Payer: Self-pay | Admitting: Internal Medicine

## 2013-01-01 ENCOUNTER — Encounter: Payer: Self-pay | Admitting: Internal Medicine

## 2013-01-01 ENCOUNTER — Ambulatory Visit: Payer: BC Managed Care – PPO | Admitting: Internal Medicine

## 2013-01-01 ENCOUNTER — Other Ambulatory Visit: Payer: Self-pay | Admitting: Internal Medicine

## 2013-01-01 DIAGNOSIS — C8590 Non-Hodgkin lymphoma, unspecified, unspecified site: Secondary | ICD-10-CM

## 2013-01-01 LAB — BASIC METABOLIC PANEL
BUN: 8 mg/dL (ref 6–23)
CO2: 27 mEq/L (ref 19–32)
Calcium: 8.8 mg/dL (ref 8.4–10.5)
Chloride: 102 mEq/L (ref 96–112)
Creatinine, Ser: 0.86 mg/dL (ref 0.50–1.35)
GFR calc Af Amer: >90 mL/min (ref >90)
GFR calc non Af Amer: >90 mL/min (ref >90)
Glucose, Bld: 101 mg/dL — ABNORMAL HIGH (ref 70–99)
Potassium: 3.9 mEq/L (ref 3.5–5.1)
Sodium: 136 mEq/L (ref 135–145)

## 2013-01-01 LAB — FERRITIN: Ferritin: 870 ng/mL — ABNORMAL HIGH (ref 22–322)

## 2013-01-01 LAB — VITAMIN B12: Vitamin B-12: 1168 pg/mL — ABNORMAL HIGH (ref 211–911)

## 2013-01-01 LAB — IRON AND TIBC
Iron: 72 ug/dL (ref 42–135)
Saturation Ratios: 28 % (ref 20–55)
TIBC: 256 ug/dL (ref 215–435)
UIBC: 184 ug/dL (ref 125–400)

## 2013-01-01 MED ORDER — RIVAROXABAN 15 MG PO TABS: 15.0000 mg | ORAL_TABLET | Freq: Two times a day (BID) | ORAL | Status: DC

## 2013-01-01 MED ORDER — UNABLE TO FIND: Status: DC

## 2013-01-01 MED ORDER — THIAMINE HCL 100 MG PO TABS: 100.0000 mg | ORAL_TABLET | Freq: Every day | ORAL | Status: DC

## 2013-01-01 MED ORDER — RIVAROXABAN 20 MG PO TABS: 20.0000 mg | ORAL_TABLET | Freq: Every day | ORAL | Status: DC

## 2013-01-01 MED ORDER — NICOTINE 21 MG/24HR TD PT24: 1.0000 | MEDICATED_PATCH | Freq: Every day | TRANSDERMAL | Status: DC

## 2013-01-01 NOTE — Telephone Encounter (Signed)
Patient called reporting he was discharged from the hospital today.  Called earlier apologizing for missing appointment and rescheduled as instructed by admitting physician.  Also asked for Gabapentin refill.  Called pharmacist who has this order and will prepare for patient.

## 2013-01-01 NOTE — Progress Notes (Signed)
Put fmla form on nurse's desk °

## 2013-01-01 NOTE — Telephone Encounter (Signed)
FMLA paperwork that has been dropped off by pt given to [facility] in medical management to process.  SLJ

## 2013-01-01 NOTE — Discharge Summary (Signed)
Physician Discharge Summary  Leonard Bentley BJY:[id number] DOB: 27-Jan-1961 DOA: 12/28/2012  PCP: Leonard Herter, MD  Admit date: 12/28/2012 Discharge date: 01/01/2013  Recommendations for Outpatient Follow-up:  1. Pt will need to follow up with PCP in 2 weeks post discharge 2. Please obtain BMP to evaluate electrolytes and kidney function 3. Please also check CBC to evaluate Hg and Hct levels 4. Follow up with Dr. Arbutus Bentley  Discharge Diagnoses:  Principal Problem:   PE (pulmonary embolism) Active Problems:   Lymphoma, non-Hodgkin's   Anemia   Hemorrhoid   Alcohol abuse   Cigarette smoker Pulmonary embolus  -No RV strain on CT or EKG  -Patient was initially started on Lovenox 1 mg per kilogram every 12 hours; this was discontinued after the patient's rivaroxaban was started -Discussed with Dr. Blake Divine who will notify Dr. Arbutus Bentley after which [facility] will contact patient for outpatient followup -Med/Onc agreed with Xarelto tx - Discontinue Lovenox  - Start rivaroxaban (12/30/12)--patient was instructed to continue rivaroxaban 15 mg twice a [place] for 19 additional days, then he will start rivaroxaban 20 mg daily. -Remained hemodynamically stable through hospitalization Pancytopenia  -Check iron studies pending results at time of d/c-->can follow up during appt with Dr. Arbutus Bentley -hemoglobin remained stable through hospitalization -Anemia is microcytic  -Continue current dose of ferrous sulfate for now  Alcohol withdrawal  - Patient had anxiety with symptoms of withdrawal early in the hospitalization the patient was placed on scheduled Librium as well as when necessary Ativan per [facility] protocol - The patient's anxiousness and tremors gradually improved throughout the hospitalization -At baseline, the patient states that he normally has a resting tremor, and he states that this has also improved -Librium was discontinued the [place] prior to discharge. The patient remained  stable without worsening of his tremors nor did he exhibit any additional signs of alcohol withdrawal -Patient went approximately 24 hours without requiring any additional benzodiazepines and remained clinically stable. Alcohol and tobacco abuse  -Cessation discussed  -Nicoderm patch  Transaminasemia  -Initial liver enzymes showed AST 191, ALT 115, alk phosphatase 57, total bilirubin 1.0 -due to acute Etoh ingestion--his last drink was on 12/27/2012 -continues to improve as they were trended throughout the hospitalization -continue to monitor  Non-Hodgkin's lymphoma  -Patient has been on observation since January 2013  -Status post 6 cycles of CHOP and Rituxan  -He had recent staging surveillance CTs on 12/28/2012 -He will followup with Dr. Francee Bentley -CT chest revealed bilateral pulmonary embolus with unchanged overall burden of lymphadenopathy -CT abdomen did not reveal any lymphomatous involvement in the abdomen or pelvis Family Communication: Pt at beside  Disposition Plan: Home when medically stable   Discharge Condition: stable   Disposition:  Follow-up Information   Follow up with [facility] MD In 1 week.   Contact information:   [address] [place] [address] [phone]     home  Diet:regular Wt Readings from Last 3 Encounters:  12/31/12 83.9 kg (184 lb 15.5 oz)  09/22/12 82.555 kg (182 lb)  09/08/12 80.74 kg (178 lb)    History of present illness:   52 y.o. male, history of stage II non-Hodgkin's lymphoma thought to be under remission being followed by Dr. Arbutus Bentley, also history of alcohol and smoking abuse, who was here for her routine stating CT scan, CT scan of the chest revealed other chronic findings however they did show moderate bilateral PE, and [facility] was called to admit the patient.  Patient incidentally is  readily symptom-free, no recent long drives or travels, no personal or family history of DVT or PE, denies any chest pain or  palpitations however he does say that for the last 2 weeks he has noticed that he has been getting easily short of breath on moderate exertion, no problems at rest no orthopnea or PND. He noted decreased exercise tolerance as he was exercising and jogging. He denies noticing any swelling in his lower extremities. There was no hemoptysis, fevers, chills, rashes.   Discharge Exam: Filed Vitals:   01/01/13 0609  BP: 123/98  Pulse: 66  Temp: 97.6 F (36.4 C)  Resp: 18   Filed Vitals:   12/31/12 1218 12/31/12 1357 12/31/12 1949 01/01/13 0609  BP: 113/82 109/93 113/73 123/98  Pulse: 66 80 67 66  Temp:  98.3 F (36.8 C) 97.3 F (36.3 C) 97.6 F (36.4 C)  TempSrc:  Oral Oral Oral  Resp:    18  Height:      Weight:      SpO2:  100% 100% 100%   General: A&O x 3, NAD, pleasant, cooperative Cardiovascular: RRR, no rub, no gallop, no S3 Respiratory: CTAB, no wheeze, no rhonchi Abdomen:soft, nontender, nondistended, positive bowel sounds Extremities: No edema, No lymphangitis, no petechiae  Discharge Instructions      Discharge Orders   Future Appointments Provider Department Dept Phone   01/08/2013 11:30 AM Leonard Bentley Leonard Bentley [facility] [phone]   01/08/2013 12:00 PM Leonard Gaul, MD [place] [phone]   Future Orders Complete By Expires     Diet - low sodium heart healthy  As directed     Discharge instructions  As directed     Comments:      Please call Dr. Arbutus Bentley (oncology) for follow up appointment    Increase activity slowly  As directed         Medication List    STOP taking these medications       ibuprofen 200 MG tablet  Commonly known as:  ADVIL,MOTRIN      TAKE these medications       ferrous sulfate 325 (65 FE) MG EC tablet  Take 325 mg by mouth daily with breakfast.     gabapentin 300 MG capsule  Commonly known as:  NEURONTIN  Take 300 mg by mouth 3 (three) times daily.      multivitamin with minerals Tabs  Take 1 tablet by mouth daily.     nicotine 21 mg/24hr patch  Commonly known as:  NICODERM CQ - dosed in mg/24 hours  Place 1 patch onto the skin daily.     Rivaroxaban 15 MG Tabs tablet  Commonly known as:  XARELTO  Take 1 tablet (15 mg total) by mouth 2 (two) times daily with a meal.     Rivaroxaban 20 MG Tabs  Commonly known as:  XARELTO  Take 1 tablet (20 mg total) by mouth daily with supper. Start AFTER finished with Xarelto 15mg  tablets     thiamine 100 MG tablet  Take 1 tablet (100 mg total) by mouth daily.     UNABLE TO FIND  Mr. Cornelio was admitted to [facility] from 12/28/12 until 01/01/13.  He is able to return to work.         The results of significant diagnostics from this hospitalization (including imaging, microbiology, ancillary and laboratory) are listed below for reference.    Significant Diagnostic Studies: Ct Head W Wo  Contrast  12/28/2012  *RADIOLOGY REPORT*  Clinical Data: Non-Hodgkins lymphoma.  History of scalp lesion. Chemotherapy completed.  CT HEAD WITHOUT AND WITH CONTRAST  Technique:  Contiguous axial images were obtained from the base of the skull through the vertex without and with intravenous contrast.  Contrast: OMNIPAQUE IOHEXOL 300 MG/ML  SOLN  Comparison: Prior CTs 06/30/2012 and 01/04/2012.  Findings: There is no evidence of acute intracranial hemorrhage, mass lesion, brain edema or extra-axial fluid collection.  The ventricles and subarachnoid spaces are appropriately sized for age. There is no CT evidence of acute cortical infarction.  Postcontrast images demonstrate no abnormal intracranial enhancement.  The visualized paranasal sinuses are clear. The calvarium is intact. There is no significant residual left temporal scalp lesion.  IMPRESSION: Stable examination.  No evidence of local recurrence or metastatic disease.   Original Report Authenticated By: [name], M.D.    Ct Chest W  Contrast  12/28/2012  *RADIOLOGY REPORT*  Clinical Data:  Non-Hodgkins lymphoma.  Staging scan.  CT CHEST, ABDOMEN AND PELVIS WITH CONTRAST  Technique:  Multidetector CT imaging of the chest, abdomen and pelvis was performed following the standard protocol during bolus administration of intravenous contrast.  Contrast: OMNIPAQUE IOHEXOL 300 MG/ML  SOLN  Comparison:   None.  CT CHEST  Findings:  Mediastinum: There are numerous filling defects throughout the pulmonary arterial tree in the lungs bilaterally, with the largest defects extending from the distal right main pulmonary artery into lobar, segmental and subsegmental sized branches throughout the right lung.  These defects appear to be predominately nonobstructive at this time.  Heart size is normal.  There is currently no dilatation of the pulmonic trunk or right heart to strongly suggest the presence of pulmonary arterial hypertension or right-sided heart strain. There is no significant pericardial fluid, thickening or pericardial calcification.  Right hilar lymphadenopathy and numerous prominent anterior mediastinal lymph nodes are very similar to the prior examination, with the right hilar lymph node currently measuring up to 18 mm in short axis, and very long anterior mediastinal lymph nodes measuring up to 1.2 x 3.5 cm.  Low right paratracheal lymph node is also very similar measuring up to 14 mm in short axis.  Esophagus is unremarkable in appearance.  Lungs/Pleura: There are currently no areas of airspace consolidation within the lungs to suggest hemorrhage from pulmonary infarction.  No pleural effusions.  4 mm nodule along the minor fissure (image 34 series 7) is unchanged and likely to represent a small subpleural lymph node.  Multiple other tiny 1-2 mm nodules are seen throughout the periphery of the lungs bilaterally, similar to numerous prior examinations (nonspecific, but presumably benign).  No definite enlarging suspicious appearing  pulmonary nodule or mass is noted.  Musculoskeletal: There are no aggressive appearing lytic or blastic lesions noted in the visualized portions of the skeleton.  IMPRESSION: 1.  Interval development of a large amount of pulmonary embolus bilaterally.  No current signs of pulmonary infarction, pulmonary arterial hypertension or right-sided heart strain. 2.  The overall burden of right hilar and mediastinal lymphadenopathy is very similar to the prior examination, as detailed above. 3.  Additional incidental findings, as above.  Critical Value/emergent results were called by telephone at the time of interpretation on 12/28/2012 at 10:15 a.m. to Dr. Arbutus Bentley, who verbally acknowledged these results.  CT ABDOMEN AND PELVIS  Findings:  Abdomen/Pelvis:  Numerous well-defined smoothly marginated non- enhancing low attenuation lesions are again seen throughout the liver, largest of which measures  3.2 cm in diameter in segment 7 compatible with cysts. There is a multi septated cystic lesion in segment 5 which is unchanged.  No new hepatic lesions are noted. The appearance of the gallbladder, pancreas, spleen, bilateral adrenal glands and right kidney is unremarkable.  Numerous subcentimeter low attenuation lesions in the left kidney remain too small to definitively characterize, however, these are similar to prior studies and likely represent small cysts.  Mild aneurysmal dilatation of the right common iliac artery (1.7 cm in diameter) and severe aneurysmal dilatation of the left common iliac artery (2.4 cm in diameter) is again noted.  The external iliac arteries also appeared diffusely dilated measuring up to 14 mm in diameter bilaterally.  The abdominal aorta is normal in caliber.  No significant volume of ascites.  No pneumoperitoneum.  No pathologic distension of small bowel.  No definite pathologic lymphadenopathy identified within the abdomen or pelvis.  Normal appendix (retrocecal).  Musculoskeletal: There are no  aggressive appearing lytic or blastic lesions noted in the visualized portions of the skeleton.  IMPRESSION: 1.  No findings to suggest lymphomatous involvement in the abdomen or pelvis. 2.  Multiple low attenuation hepatic and left renal lesions appear similar to prior studies and likely represent multiple cysts. 3.  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 4.  Additional incidental findings, as above.   Original Report Authenticated By: Trudie Reed, M.D.    Ct Abdomen Pelvis W Contrast  12/28/2012  *RADIOLOGY REPORT*  Clinical Data:  Non-Hodgkins lymphoma.  Staging scan.  CT CHEST, ABDOMEN AND PELVIS WITH CONTRAST  Technique:  Multidetector CT imaging of the chest, abdomen and pelvis was performed following the standard protocol during bolus administration of intravenous contrast.  Contrast: OMNIPAQUE IOHEXOL 300 MG/ML  SOLN  Comparison:   None.  CT CHEST  Findings:  Mediastinum: There are numerous filling defects throughout the pulmonary arterial tree in the lungs bilaterally, with the largest defects extending from the distal right main pulmonary artery into lobar, segmental and subsegmental sized branches throughout the right lung.  These defects appear to be predominately nonobstructive at this time.  Heart size is normal.  There is currently no dilatation of the pulmonic trunk or right heart to strongly suggest the presence of pulmonary arterial hypertension or right-sided heart strain. There is no significant pericardial fluid, thickening or pericardial calcification.  Right hilar lymphadenopathy and numerous prominent anterior mediastinal lymph nodes are very similar to the prior examination, with the right hilar lymph node currently measuring up to 18 mm in short axis, and very long anterior mediastinal lymph nodes measuring up to 1.2 x 3.5 cm.  Low right paratracheal lymph  node is also very similar measuring up to 14 mm in short axis.  Esophagus is unremarkable in appearance.  Lungs/Pleura: There are currently no areas of airspace consolidation within the lungs to suggest hemorrhage from pulmonary infarction.  No pleural effusions.  4 mm nodule along the minor fissure (image 34 series 7) is unchanged and likely to represent a small subpleural lymph node.  Multiple other tiny 1-2 mm nodules are seen throughout the periphery of the lungs bilaterally, similar to numerous prior examinations (nonspecific, but presumably benign).  No definite enlarging suspicious appearing pulmonary nodule or mass is noted.  Musculoskeletal: There are no aggressive appearing lytic or blastic lesions noted in the visualized portions of the skeleton.  IMPRESSION: 1.  Interval  development of a large amount of pulmonary embolus bilaterally.  No current signs of pulmonary infarction, pulmonary arterial hypertension or right-sided heart strain. 2.  The overall burden of right hilar and mediastinal lymphadenopathy is very similar to the prior examination, as detailed above. 3.  Additional incidental findings, as above.  Critical Value/emergent results were called by telephone at the time of interpretation on 12/28/2012 at 10:15 a.m. to Dr. Arbutus Bentley, who verbally acknowledged these results.  CT ABDOMEN AND PELVIS  Findings:  Abdomen/Pelvis:  Numerous well-defined smoothly marginated non- enhancing low attenuation lesions are again seen throughout the liver, largest of which measures 3.2 cm in diameter in segment 7 compatible with cysts. There is a multi septated cystic lesion in segment 5 which is unchanged.  No new hepatic lesions are noted. The appearance of the gallbladder, pancreas, spleen, bilateral adrenal glands and right kidney is unremarkable.  Numerous subcentimeter low attenuation lesions in the left kidney remain too small to definitively characterize, however, these are similar to prior studies and  likely represent small cysts.  Mild aneurysmal dilatation of the right common iliac artery (1.7 cm in diameter) and severe aneurysmal dilatation of the left common iliac artery (2.4 cm in diameter) is again noted.  The external iliac arteries also appeared diffusely dilated measuring up to 14 mm in diameter bilaterally.  The abdominal aorta is normal in caliber.  No significant volume of ascites.  No pneumoperitoneum.  No pathologic distension of small bowel.  No definite pathologic lymphadenopathy identified within the abdomen or pelvis.  Normal appendix (retrocecal).  Musculoskeletal: There are no aggressive appearing lytic or blastic lesions noted in the visualized portions of the skeleton.  IMPRESSION: 1.  No findings to suggest lymphomatous involvement in the abdomen or pelvis. 2.  Multiple low attenuation hepatic and left renal lesions appear similar to prior studies and likely represent multiple cysts. 3.  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 4.  Additional incidental findings, as above.   Original Report Authenticated By: Trudie Reed, M.D.      Microbiology: No results found for this or any previous visit (from the past 240 hour(s)).   Labs: Basic Metabolic Panel:  Recent Labs Lab 12/28/12 0824  12/28/12 1105 12/29/12 0446 12/31/12 0535 01/01/13 0458  NA 141  --  137 136 136 136  K 4.1  < > 4.1 4.1 3.7 3.9  CL 98  --  96 100 101 102  CO2 30*  --  27 28 28 27   GLUCOSE 87  --  110* 96 101* 101*  BUN 19.9  --  19 16 10 8   CREATININE 1.1  --  0.94 0.86 0.87 0.86  CALCIUM 9.4  --  8.7 8.3* 8.5 8.8  < > = values in this interval not displayed. Liver Function Tests:  Recent Labs Lab 12/28/12 0824 12/28/12 1105 12/30/12 0823  AST 210* 191* 110*  ALT 132* 115* 88*  ALKPHOS 61 57 55  BILITOT 1.17 1.0 0.9  PROT 6.8 6.8 6.7  ALBUMIN 3.4* 3.3* 3.6   No  results found for this basename: LIPASE, AMYLASE,  in the last 168 hours No results found for this basename: AMMONIA,  in the last 168 hours CBC:  Recent Labs Lab 12/28/12 0824 12/28/12 1105 12/29/12 0446 12/31/12 0535  WBC 2.1* 2.4* 2.3* 2.4*  NEUTROABS 1.2* 1.4*  --  1.3*  HGB 12.7* 12.1* 11.0* 10.4*  HCT 38.1* 35.9* 33.2* 32.0*  MCV 78.1* 77.9* 79.2 79.8  PLT 116* 125* PLATELET CLUMPS NOTED ON SMEAR, COUNT APPEARS ADEQUATE 127*   Cardiac Enzymes: No results found for this basename: CKTOTAL, CKMB, CKMBINDEX, TROPONINI,  in the last 168 hours BNP: No components found with this basename: POCBNP,  CBG: No results found for this basename: GLUCAP,  in the last 168 hours  Time coordinating discharge:  Greater than 30 minutes  Signed:  Lianna Sitzmann, DO [facility] Pager: [phone] 01/01/2013, 9:01 AM

## 2013-01-04 ENCOUNTER — Encounter: Payer: Self-pay | Admitting: Internal Medicine

## 2013-01-04 ENCOUNTER — Telehealth: Payer: Self-pay | Admitting: *Deleted

## 2013-01-04 NOTE — Telephone Encounter (Signed)
FMLA papers signed by Dr MKM and returned to [place] in medical management.  SLJ 

## 2013-01-04 NOTE — Progress Notes (Signed)
Mailed fmla form to [employer], [place].

## 2013-01-08 ENCOUNTER — Telehealth: Payer: Self-pay | Admitting: Internal Medicine

## 2013-01-08 ENCOUNTER — Encounter: Payer: Self-pay | Admitting: Internal Medicine

## 2013-01-08 ENCOUNTER — Ambulatory Visit (HOSPITAL_BASED_OUTPATIENT_CLINIC_OR_DEPARTMENT_OTHER): Payer: PRIVATE HEALTH INSURANCE | Admitting: Internal Medicine

## 2013-01-08 ENCOUNTER — Other Ambulatory Visit (HOSPITAL_BASED_OUTPATIENT_CLINIC_OR_DEPARTMENT_OTHER): Payer: PRIVATE HEALTH INSURANCE

## 2013-01-08 VITALS — BP 150/105 | HR 83 | Temp 97.7°F | Resp 18 | Ht 70.0 in | Wt 184.2 lb

## 2013-01-08 DIAGNOSIS — C8581 Other specified types of non-Hodgkin lymphoma, lymph nodes of head, face, and neck: Secondary | ICD-10-CM

## 2013-01-08 DIAGNOSIS — I2699 Other pulmonary embolism without acute cor pulmonale: Secondary | ICD-10-CM

## 2013-01-08 DIAGNOSIS — C8590 Non-Hodgkin lymphoma, unspecified, unspecified site: Secondary | ICD-10-CM

## 2013-01-08 LAB — LACTATE DEHYDROGENASE (CC13): LDH: 344 U/L — ABNORMAL HIGH (ref 125–245)

## 2013-01-08 NOTE — Patient Instructions (Signed)
No significant evidence for disease recurrence on the recent scan. Your scan showed bilateral pulmonary embolism and you are currently on anticoagulation with Xarelto. Followup in 6 months with repeat CT scan of the head, chest, abdomen and pelvis.

## 2013-01-08 NOTE — Progress Notes (Signed)
[facility] [phone]   Fax:([phone]  OFFICE PROGRESS NOTE  Carollee Herter, MD [address] [place] [address]  DIAGNOSIS:  1) Stage II, large B-cell non-Hodgkin's lymphoma diagnosed in September 2012 with a scalp lesion. In addition he had mediastinal lymphadenopathy.  2) bilateral pulmonary embolism diagnosed on CT scan of the chest on 12/28/2012.  PRIOR THERAPY: Systemic chemotherapy with CHOP/Rituxan, given every 3 weeks with Neulasta support, status post 6 cycles, last dose was given 12/01/2011 with complete response.   CURRENT THERAPY: Xarelto 15 mg by mouth twice a [place].  INTERVAL HISTORY: Leonard Bentley 52 y.o. male returns to the clinic today for routine six-month followup visit. The patient was recently diagnosed with extensive bilateral pulmonary emboli on recent routine CT scan of the chest. He did not have any symptoms at that time. He was admitted to [facility] and was started on anticoagulation. He is currently on Xarelto 15 mg by mouth twice a [place] for 10 more days before switching to 20 mg by mouth daily. He is feeling fine with no specific complaints. He was on treatment with testosterone for body building and I asked him to discontinue this treatment as may have contributed to his bilateral pulmonary emboli. He denied having any other significant complaints today. He has no significant weight loss or night sweats. He has no palpable lymphadenopathy. He denied having any significant chest pain, shortness breath, cough or hemoptysis.  MEDICAL HISTORY: Past Medical History  Diagnosis Date  . ED (erectile dysfunction)   . Cancer 07/2011    STAGE II NON- HODGKIN LYMPHOMA  . Lymphoma, non Hodgkin's 09/25/2011    ALLERGIES:  has No Known Allergies.  MEDICATIONS:  Current Outpatient Prescriptions  Medication Sig Dispense Refill  . azithromycin (ZITHROMAX) 250 MG tablet Take 250 mg by mouth daily.      .  ferrous sulfate 325 (65 FE) MG EC tablet Take 325 mg by mouth daily with breakfast.      . gabapentin (NEURONTIN) 300 MG capsule TAKE ONE CAPSULE BY MOUTH THREE TIMES DAILY  90 capsule  0  . Multiple Vitamin (MULTIVITAMIN WITH MINERALS) TABS Take 1 tablet by mouth daily.      . nicotine (NICODERM CQ - DOSED IN MG/24 HOURS) 21 mg/24hr patch Place 1 patch onto the skin daily.  28 patch    . Rivaroxaban (XARELTO) 15 MG TABS tablet Take 1 tablet (15 mg total) by mouth 2 (two) times daily with a meal.  38 tablet  0  . Rivaroxaban (XARELTO) 20 MG TABS Take 1 tablet (20 mg total) by mouth daily with supper. Start AFTER finished with Xarelto 15mg  tablets  30 tablet  2  . thiamine 100 MG tablet Take 1 tablet (100 mg total) by mouth daily.  30 tablet  0  . UNABLE TO FIND [name] was admitted to [facility] from 12/28/12 until 01/01/13.  He is able to return to work.  1 Units  0   No current facility-administered medications for this visit.    SURGICAL HISTORY:  Past Surgical History  Procedure Laterality Date  . No past surgeries      REVIEW OF SYSTEMS:  A comprehensive review of systems was negative.   PHYSICAL EXAMINATION: General appearance: alert, cooperative and no distress Head: Normocephalic, without obvious abnormality, atraumatic Neck: no adenopathy Lymph nodes: Cervical, supraclavicular, and axillary nodes normal. Resp: clear to auscultation bilaterally Cardio: regular rate and rhythm, S1,  S2 normal, no murmur, click, rub or gallop GI: soft, non-tender; bowel sounds normal; no masses,  no organomegaly Extremities: extremities normal, atraumatic, no cyanosis or edema  ECOG PERFORMANCE STATUS: 0 - Asymptomatic  Blood pressure 150/105, pulse 83, temperature 97.7 F (36.5 C), temperature source Oral, resp. rate 18, height 5\' 10"  (1.778 m), weight 184 lb 3.2 oz (83.553 kg).  LABORATORY DATA: Lab Results  Component Value Date   WBC 2.4* 12/31/2012   HGB 10.4* 12/31/2012   HCT  32.0* 12/31/2012   MCV 79.8 12/31/2012   PLT 127* 12/31/2012      Chemistry      Component Value Date/Time   NA 136 01/01/2013 0458   NA 141 12/28/2012 0824   NA 137 06/30/2012 0754   K 3.9 01/01/2013 0458   K 4.1 12/28/2012 0824   K 3.8 06/30/2012 0754   CL 102 01/01/2013 0458   CL 98 12/28/2012 0824   CL 97* 06/30/2012 0754   CO2 27 01/01/2013 0458   CO2 30* 12/28/2012 0824   CO2 30 06/30/2012 0754   BUN 8 01/01/2013 0458   BUN 19.9 12/28/2012 0824   BUN 14 06/30/2012 0754   CREATININE 0.86 01/01/2013 0458   CREATININE 1.1 12/28/2012 0824   CREATININE 1.0 06/30/2012 0754      Component Value Date/Time   CALCIUM 8.8 01/01/2013 0458   CALCIUM 9.4 12/28/2012 0824   CALCIUM 8.7 06/30/2012 0754   ALKPHOS 55 12/30/2012 0823   ALKPHOS 61 12/28/2012 0824   ALKPHOS 43 06/30/2012 0754   AST 110* 12/30/2012 0823   AST 210* 12/28/2012 0824   AST 62* 06/30/2012 0754   ALT 88* 12/30/2012 0823   ALT 132* 12/28/2012 0824   BILITOT 0.9 12/30/2012 0823   BILITOT 1.17 12/28/2012 0824   BILITOT 1.60 06/30/2012 0754       RADIOGRAPHIC STUDIES: Ct Head [name]  12/28/2012  *RADIOLOGY REPORT*  Clinical Data: Non-Hodgkins lymphoma.  History of scalp lesion. Chemotherapy completed.  CT HEAD WITHOUT AND WITH CONTRAST  Technique:  Contiguous axial images were obtained from the base of the skull through the vertex without and with intravenous contrast.  Contrast: OMNIPAQUE IOHEXOL 300 MG/ML  SOLN  Comparison: Prior CTs 06/30/2012 and 01/04/2012.  Findings: There is no evidence of acute intracranial hemorrhage, mass lesion, brain edema or extra-axial fluid collection.  The ventricles and subarachnoid spaces are appropriately sized for age. There is no CT evidence of acute cortical infarction.  Postcontrast images demonstrate no abnormal intracranial enhancement.  The visualized paranasal sinuses are clear. The calvarium is intact. There is no significant residual left temporal scalp lesion.  IMPRESSION: Stable  examination.  No evidence of local recurrence or metastatic disease.   Original Report Authenticated By: [name], M.D.    Ct Chest W Contrast  12/28/2012  *RADIOLOGY REPORT*  Clinical Data:  Non-Hodgkins lymphoma.  Staging scan.  CT CHEST, ABDOMEN AND PELVIS WITH CONTRAST  Technique:  Multidetector CT imaging of the chest, abdomen and pelvis was performed following the standard protocol during bolus administration of intravenous contrast.  Contrast: OMNIPAQUE IOHEXOL 300 MG/ML  SOLN  Comparison:   None.  CT CHEST  Findings:  Mediastinum: There are numerous filling defects throughout the pulmonary arterial tree in the lungs bilaterally, with the largest defects extending from the distal right main pulmonary artery into lobar, segmental and subsegmental sized branches throughout the right lung.  These defects appear to be predominately nonobstructive at this time.  Heart  size is normal.  There is currently no dilatation of the pulmonic trunk or right heart to strongly suggest the presence of pulmonary arterial hypertension or right-sided heart strain. There is no significant pericardial fluid, thickening or pericardial calcification.  Right hilar lymphadenopathy and numerous prominent anterior mediastinal lymph nodes are very similar to the prior examination, with the right hilar lymph node currently measuring up to 18 mm in short axis, and very long anterior mediastinal lymph nodes measuring up to 1.2 x 3.5 cm.  Low right paratracheal lymph node is also very similar measuring up to 14 mm in short axis.  Esophagus is unremarkable in appearance.  Lungs/Pleura: There are currently no areas of airspace consolidation within the lungs to suggest hemorrhage from pulmonary infarction.  No pleural effusions.  4 mm nodule along the minor fissure (image 34 series 7) is unchanged and likely to represent a small subpleural lymph node.  Multiple other tiny 1-2 mm nodules are seen throughout the periphery of the  lungs bilaterally, similar to numerous prior examinations (nonspecific, but presumably benign).  No definite enlarging suspicious appearing pulmonary nodule or mass is noted.  Musculoskeletal: There are no aggressive appearing lytic or blastic lesions noted in the visualized portions of the skeleton.  IMPRESSION: 1.  Interval development of a large amount of pulmonary embolus bilaterally.  No current signs of pulmonary infarction, pulmonary arterial hypertension or right-sided heart strain. 2.  The overall burden of right hilar and mediastinal lymphadenopathy is very similar to the prior examination, as detailed above. 3.  Additional incidental findings, as above.  Critical Value/emergent results were called by telephone at the time of interpretation on 12/28/2012 at 10:15 a.m. to Dr. Arbutus Ped, who verbally acknowledged these results.  CT ABDOMEN AND PELVIS  Findings:  Abdomen/Pelvis:  Numerous well-defined smoothly marginated non- enhancing low attenuation lesions are again seen throughout the liver, largest of which measures 3.2 cm in diameter in segment 7 compatible with cysts. There is a multi septated cystic lesion in segment 5 which is unchanged.  No new hepatic lesions are noted. The appearance of the gallbladder, pancreas, spleen, bilateral adrenal glands and right kidney is unremarkable.  Numerous subcentimeter low attenuation lesions in the left kidney remain too small to definitively characterize, however, these are similar to prior studies and likely represent small cysts.  Mild aneurysmal dilatation of the right common iliac artery (1.7 cm in diameter) and severe aneurysmal dilatation of the left common iliac artery (2.4 cm in diameter) is again noted.  The external iliac arteries also appeared diffusely dilated measuring up to 14 mm in diameter bilaterally.  The abdominal aorta is normal in caliber.  No significant volume of ascites.  No pneumoperitoneum.  No pathologic distension of small bowel.  No  definite pathologic lymphadenopathy identified within the abdomen or pelvis.  Normal appendix (retrocecal).  Musculoskeletal: There are no aggressive appearing lytic or blastic lesions noted in the visualized portions of the skeleton.  IMPRESSION: 1.  No findings to suggest lymphomatous involvement in the abdomen or pelvis. 2.  Multiple low attenuation hepatic and left renal lesions appear similar to prior studies and likely represent multiple cysts. 3.  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 4.  Additional incidental findings, as above.   Original Report Authenticated By: Trudie Reed, M.D.    Ct Abdomen Pelvis W Contrast  12/28/2012  *RADIOLOGY REPORT*  Clinical Data:  Non-Hodgkins lymphoma.  Staging scan.  CT CHEST, ABDOMEN AND PELVIS WITH CONTRAST  Technique:  Multidetector CT imaging of the chest, abdomen and pelvis was performed following the standard protocol during bolus administration of intravenous contrast.  Contrast: OMNIPAQUE IOHEXOL 300 MG/ML  SOLN  Comparison:   None.  CT CHEST  Findings:  Mediastinum: There are numerous filling defects throughout the pulmonary arterial tree in the lungs bilaterally, with the largest defects extending from the distal right main pulmonary artery into lobar, segmental and subsegmental sized branches throughout the right lung.  These defects appear to be predominately nonobstructive at this time.  Heart size is normal.  There is currently no dilatation of the pulmonic trunk or right heart to strongly suggest the presence of pulmonary arterial hypertension or right-sided heart strain. There is no significant pericardial fluid, thickening or pericardial calcification.  Right hilar lymphadenopathy and numerous prominent anterior mediastinal lymph nodes are very similar to the prior examination, with the right hilar lymph node currently  measuring up to 18 mm in short axis, and very long anterior mediastinal lymph nodes measuring up to 1.2 x 3.5 cm.  Low right paratracheal lymph node is also very similar measuring up to 14 mm in short axis.  Esophagus is unremarkable in appearance.  Lungs/Pleura: There are currently no areas of airspace consolidation within the lungs to suggest hemorrhage from pulmonary infarction.  No pleural effusions.  4 mm nodule along the minor fissure (image 34 series 7) is unchanged and likely to represent a small subpleural lymph node.  Multiple other tiny 1-2 mm nodules are seen throughout the periphery of the lungs bilaterally, similar to numerous prior examinations (nonspecific, but presumably benign).  No definite enlarging suspicious appearing pulmonary nodule or mass is noted.  Musculoskeletal: There are no aggressive appearing lytic or blastic lesions noted in the visualized portions of the skeleton.  IMPRESSION: 1.  Interval development of a large amount of pulmonary embolus bilaterally.  No current signs of pulmonary infarction, pulmonary arterial hypertension or right-sided heart strain. 2.  The overall burden of right hilar and mediastinal lymphadenopathy is very similar to the prior examination, as detailed above. 3.  Additional incidental findings, as above.  Critical Value/emergent results were called by telephone at the time of interpretation on 12/28/2012 at 10:15 a.m. to Dr. Arbutus Ped, who verbally acknowledged these results.  CT ABDOMEN AND PELVIS  Findings:  Abdomen/Pelvis:  Numerous well-defined smoothly marginated non- enhancing low attenuation lesions are again seen throughout the liver, largest of which measures 3.2 cm in diameter in segment 7 compatible with cysts. There is a multi septated cystic lesion in segment 5 which is unchanged.  No new hepatic lesions are noted. The appearance of the gallbladder, pancreas, spleen, bilateral adrenal glands and right kidney is unremarkable.  Numerous  subcentimeter low attenuation lesions in the left kidney remain too small to definitively characterize, however, these are similar to prior studies and likely represent small cysts.  Mild aneurysmal dilatation of the right common iliac artery (1.7 cm in diameter) and severe aneurysmal dilatation of the left common iliac artery (2.4 cm in diameter) is again noted.  The external iliac arteries also appeared diffusely dilated measuring up to 14 mm in diameter bilaterally.  The abdominal aorta is normal in caliber.  No significant volume of ascites.  No pneumoperitoneum.  No pathologic distension of small bowel.  No definite pathologic lymphadenopathy identified within the abdomen or pelvis.  Normal appendix (retrocecal).  Musculoskeletal: There are no aggressive appearing lytic or blastic  lesions noted in the visualized portions of the skeleton.  IMPRESSION: 1.  No findings to suggest lymphomatous involvement in the abdomen or pelvis. 2.  Multiple low attenuation hepatic and left renal lesions appear similar to prior studies and likely represent multiple cysts. 3.  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 4.  Additional incidental findings, as above.   Original Report Authenticated By: Trudie Reed, M.D.     ASSESSMENT: This is a very pleasant 52 years old African American male with history of stage II large B-cell non-Hodgkin lymphoma involving the scalp status post systemic chemotherapy with CHOP/Rituxan for a total of 6 cycles with complete response. The patient was recently diagnosed with extensive bilateral pulmonary embolism most likely secondary to his hormonal treatment for muscle building.  PLAN: I discussed the scan results with the patient today and recommended for him to continue on observation for the lymphoma with repeat CT scan of the head, chest, abdomen and pelvis in 6  months. For the recently diagnosed extensive bilateral pulmonary emboli, the patient will continue on treatment with Xarelto. He would be followed by his primary care physician Dr. Susann Givens.  He was advised to call me immediately if he has any concerning symptoms in the interval. I advise him to discontinue his treatment with testosterone at this time.  All questions were answered. The patient knows to call the clinic with any problems, questions or concerns. We can certainly see the patient much sooner if necessary.

## 2013-01-08 NOTE — Telephone Encounter (Signed)
Gave pt appt for lab and MD on September 2014 , lab before CT, pt will come by later to get oral contrast

## 2013-01-10 ENCOUNTER — Telehealth: Payer: Self-pay | Admitting: Medical Oncology

## 2013-01-10 NOTE — Telephone Encounter (Signed)
I left message -Per Dr Arbutus Ped take 2 more weeks off from exercising then may do elliptical and light weight lifting.

## 2013-01-19 ENCOUNTER — Telehealth: Payer: Self-pay | Admitting: *Deleted

## 2013-01-19 ENCOUNTER — Ambulatory Visit: Payer: PRIVATE HEALTH INSURANCE | Admitting: Family Medicine

## 2013-01-19 NOTE — Telephone Encounter (Signed)
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  [employer]

## 2013-01-24 ENCOUNTER — Ambulatory Visit: Payer: Self-pay | Admitting: Family Medicine

## 2013-01-25 ENCOUNTER — Encounter (HOSPITAL_COMMUNITY): Payer: Self-pay | Admitting: *Deleted

## 2013-01-25 ENCOUNTER — Emergency Department (HOSPITAL_COMMUNITY): Payer: PRIVATE HEALTH INSURANCE

## 2013-01-25 ENCOUNTER — Ambulatory Visit: Payer: Self-pay | Admitting: Family Medicine

## 2013-01-25 ENCOUNTER — Emergency Department (HOSPITAL_COMMUNITY)
Admission: EM | Admit: 2013-01-25 | Discharge: 2013-01-25 | Disposition: A | Discharge status: Home / Self Care | Payer: PRIVATE HEALTH INSURANCE | Attending: Emergency Medicine | Admitting: Emergency Medicine

## 2013-01-25 DIAGNOSIS — F17200 Nicotine dependence, unspecified, uncomplicated: Secondary | ICD-10-CM | POA: Insufficient documentation

## 2013-01-25 DIAGNOSIS — Z87898 Personal history of other specified conditions: Secondary | ICD-10-CM | POA: Insufficient documentation

## 2013-01-25 DIAGNOSIS — F329 Major depressive disorder, single episode, unspecified: Secondary | ICD-10-CM | POA: Insufficient documentation

## 2013-01-25 DIAGNOSIS — Z79899 Other long term (current) drug therapy: Secondary | ICD-10-CM | POA: Insufficient documentation

## 2013-01-25 DIAGNOSIS — Z87448 Personal history of other diseases of urinary system: Secondary | ICD-10-CM | POA: Insufficient documentation

## 2013-01-25 DIAGNOSIS — F419 Anxiety disorder, unspecified: Secondary | ICD-10-CM

## 2013-01-25 DIAGNOSIS — F411 Generalized anxiety disorder: Secondary | ICD-10-CM | POA: Insufficient documentation

## 2013-01-25 DIAGNOSIS — F3289 Other specified depressive episodes: Secondary | ICD-10-CM | POA: Insufficient documentation

## 2013-01-25 DIAGNOSIS — R0602 Shortness of breath: Secondary | ICD-10-CM | POA: Insufficient documentation

## 2013-01-25 DIAGNOSIS — Z86711 Personal history of pulmonary embolism: Secondary | ICD-10-CM | POA: Insufficient documentation

## 2013-01-25 HISTORY — DX: Depression, unspecified: F32.A

## 2013-01-25 HISTORY — DX: Major depressive disorder, single episode, unspecified: F32.9

## 2013-01-25 LAB — COMPREHENSIVE METABOLIC PANEL
ALT: 101 U/L — ABNORMAL HIGH (ref 0–53)
AST: 181 U/L — ABNORMAL HIGH (ref 0–37)
Albumin: 3.4 g/dL — ABNORMAL LOW (ref 3.5–5.2)
Alkaline Phosphatase: 55 U/L (ref 39–117)
BUN: 19 mg/dL (ref 6–23)
CO2: 26 mEq/L (ref 19–32)
Calcium: 8.7 mg/dL (ref 8.4–10.5)
Chloride: 96 mEq/L (ref 96–112)
Creatinine, Ser: 0.85 mg/dL (ref 0.50–1.35)
GFR calc Af Amer: >90 mL/min (ref >90)
GFR calc non Af Amer: >90 mL/min (ref >90)
Glucose, Bld: 96 mg/dL (ref 70–99)
Potassium: 4.2 mEq/L (ref 3.5–5.1)
Sodium: 135 mEq/L (ref 135–145)
Total Bilirubin: 0.7 mg/dL (ref 0.3–1.2)
Total Protein: 6.7 g/dL (ref 6.0–8.3)

## 2013-01-25 LAB — URINALYSIS, ROUTINE W REFLEX MICROSCOPIC
Bilirubin Urine: NEGATIVE
Glucose, UA: NEGATIVE mg/dL
Hgb urine dipstick: NEGATIVE
Ketones, ur: NEGATIVE mg/dL
Leukocytes, UA: NEGATIVE
Nitrite: NEGATIVE
Protein, ur: NEGATIVE mg/dL
Specific Gravity, Urine: 1.03 (ref 1.005–1.030)
Urobilinogen, UA: 1 mg/dL (ref 0.0–1.0)
pH: 5.5 (ref 5.0–8.0)

## 2013-01-25 LAB — RAPID URINE DRUG SCREEN, HOSP PERFORMED
Amphetamines: NOT DETECTED
Barbiturates: NOT DETECTED
Benzodiazepines: NOT DETECTED
Cocaine: NOT DETECTED
Opiates: NOT DETECTED
Tetrahydrocannabinol: NOT DETECTED

## 2013-01-25 LAB — CBC WITH DIFFERENTIAL/PLATELET
Basophils Absolute: 0.1 10*3/uL (ref 0.0–0.1)
Basophils Relative: 3 % — ABNORMAL HIGH (ref 0–1)
Eosinophils Absolute: 0 10*3/uL (ref 0.0–0.7)
Eosinophils Relative: 2 % (ref 0–5)
HCT: 36.6 % — ABNORMAL LOW (ref 39.0–52.0)
Hemoglobin: 12.1 g/dL — ABNORMAL LOW (ref 13.0–17.0)
Lymphocytes Relative: 27 % (ref 12–46)
Lymphs Abs: 0.6 10*3/uL — ABNORMAL LOW (ref 0.7–4.0)
MCH: 25.8 pg — ABNORMAL LOW (ref 26.0–34.0)
MCHC: 33.1 g/dL (ref 30.0–36.0)
MCV: 78 fL (ref 78.0–100.0)
Monocytes Absolute: 0.3 10*3/uL (ref 0.1–1.0)
Monocytes Relative: 15 % — ABNORMAL HIGH (ref 3–12)
Neutro Abs: 1.1 10*3/uL — ABNORMAL LOW (ref 1.7–7.7)
Neutrophils Relative %: 53 % (ref 43–77)
Platelets: 119 10*3/uL — ABNORMAL LOW (ref 150–400)
RBC: 4.69 MIL/uL (ref 4.22–5.81)
RDW: 15.1 % (ref 11.5–15.5)
WBC: 2.1 10*3/uL — ABNORMAL LOW (ref 4.0–10.5)

## 2013-01-25 LAB — ETHANOL: Alcohol, Ethyl (B): 165 mg/dL — ABNORMAL HIGH (ref 0–11)

## 2013-01-25 NOTE — ED Notes (Signed)
Pt from home with reports of intermittent generalized weakness and shortness of breath that has worsened over the last couple days. Pt endorses hx of being hospitalized about 3 weeks ago for PE and reports not feeling "himself" since.

## 2013-01-25 NOTE — ED Notes (Signed)
Patient transported to X-ray 

## 2013-01-25 NOTE — ED Notes (Signed)
Pt states yesterday started feeling weak and like he was going to pass out, but would go away and he would feel fine, stated at one point he did have some shortness of breath, states this morning feeling the same way, denies dizziness, states he just feels weak like he's going to pass out, denies pain, denies shortness of breath at this time. States also has been having some itching on abdomen since taking blood thinner for recently diagnosed PE's.

## 2013-01-25 NOTE — ED Provider Notes (Signed)
History     CSN: [id number]  Arrival date & time 01/25/13  0719   First MD Initiated Contact with Patient 01/25/13 [phone]      Chief Complaint  Patient presents with  . Weakness  . Shortness of Breath    (Consider location/radiation/quality/duration/timing/severity/associated sxs/prior treatment) HPI Comments: Leonard Bentley is a 52 y.o. Male who prevents for evaluation of weakness. The weakness is intermittent, but ongoing. It has been present since his recent diagnosis of PE, and starting on Xarelto. There is no persistent SOB. He has not been sleeping well. He continues to use alcohol, 1 pint of liquor each [place]. He smokes cigarettes. He denies chest pain, headache, blurred vision, paresthesias, abdominal pain, nausea, vomiting, dysuria, or change in bowel habits. There no specific modifying factors.  Patient is a 52 y.o. male presenting with weakness and shortness of breath. The history is provided by the patient.  Weakness Associated symptoms include shortness of breath.  Shortness of Breath   Past Medical History  Diagnosis Date  . ED (erectile dysfunction)   . Cancer 07/2011    STAGE II NON- HODGKIN LYMPHOMA  . Lymphoma, non Hodgkin's 09/25/2011  . Pulmonary emboli   . Depression     Past Surgical History  Procedure Laterality Date  . No past surgeries      Family History  Problem Relation Age of Onset  . Cancer Mother     BREAST(BONE)  . Cancer Father     PANCREATIC    History  Substance Use Topics  . Smoking status: Current Every [place] Smoker -- 1.00 packs/[place] for 32 years    Types: Cigarettes  . Smokeless tobacco: Never Used  . Alcohol Use: Yes     Comment: 5th a [place] of hard liquor      Review of Systems  Respiratory: Positive for shortness of breath.   Neurological: Positive for weakness.  All other systems reviewed and are negative.    Allergies  Review of patient's allergies indicates no known allergies.  Home Medications   Current Outpatient  Rx  Name  Route  Sig  Dispense  Refill  . ferrous sulfate 325 (65 FE) MG EC tablet   Oral   Take 325 mg by mouth daily with breakfast.         . gabapentin (NEURONTIN) 300 MG capsule      TAKE ONE CAPSULE BY MOUTH THREE TIMES DAILY   90 capsule   0   . Multiple Vitamin (MULTIVITAMIN WITH MINERALS) TABS   Oral   Take 1 tablet by mouth daily.         . Rivaroxaban (XARELTO) 15 MG TABS tablet   Oral   Take 1 tablet (15 mg total) by mouth 2 (two) times daily with a meal.   38 tablet   0   . thiamine 100 MG tablet   Oral   Take 1 tablet (100 mg total) by mouth daily.   30 tablet   0     BP 108/82  Pulse 95  Temp(Src) 97.5 F (36.4 C) (Oral)  Resp 18  SpO2 98%  Physical Exam  Nursing note and vitals reviewed. Constitutional: He is oriented to person, place, and time. He appears well-developed and well-nourished.  HENT:  Head: Normocephalic and atraumatic.  Right Ear: External ear normal.  Left Ear: External ear normal.  Eyes: Conjunctivae and EOM are normal. Pupils are equal, round, and reactive to light.  Neck: Normal range of motion and phonation normal.  Neck supple.  Cardiovascular: Normal rate, regular rhythm, normal heart sounds and intact distal pulses.   Pulmonary/Chest: Effort normal and breath sounds normal. He exhibits no bony tenderness.  Abdominal: Soft. Normal appearance. There is no tenderness.  Musculoskeletal: Normal range of motion.  Neurological: He is alert and oriented to person, place, and time. He has normal strength. No cranial nerve deficit or sensory deficit. He exhibits normal muscle tone. Coordination normal.  Skin: Skin is warm, dry and intact.  Psychiatric: His behavior is normal. Judgment and thought content normal.  He is moderately anxious    ED Course  Procedures (including critical care time)   Reeval: 10:30- no further complaints. Vital signs stable. He continues to be anxious. He has a PCP, to see for followup care.      Date: 08/25/2012  Rate: 73  Rhythm: normal sinus rhythm  QRS Axis: left  PR and QT Intervals: normal  ST/T Wave abnormalities: normal  PR and QRS Conduction Disutrbances:none  Narrative Interpretation:   Old EKG Reviewed: unchanged- 12/28/12   Labs Reviewed  CBC WITH DIFFERENTIAL - Abnormal; Notable for the following:    WBC 2.1 (*)    Hemoglobin 12.1 (*)    HCT 36.6 (*)    MCH 25.8 (*)    Platelets 119 (*)    Monocytes Relative 15 (*)    Basophils Relative 3 (*)    Neutro Abs 1.1 (*)    Lymphs Abs 0.6 (*)    All other components within normal limits  COMPREHENSIVE METABOLIC PANEL - Abnormal; Notable for the following:    Albumin 3.4 (*)    AST 181 (*)    ALT 101 (*)    All other components within normal limits  ETHANOL - Abnormal; Notable for the following:    Alcohol, Ethyl (B) 165 (*)    All other components within normal limits  URINALYSIS, ROUTINE W REFLEX MICROSCOPIC  URINE RAPID DRUG SCREEN (HOSP PERFORMED)   Dg Chest 2 View  01/25/2013  *RADIOLOGY REPORT*  Clinical Data: Weak, short of breath  CHEST - 2 VIEW  Comparison: Prior chest x-ray 08/24/2012  Findings: The lungs are well-aerated and free from pulmonary edema, focal airspace consolidation or pulmonary nodule.  Cardiac and mediastinal contours are within normal limits.  No pneumothorax, or pleural effusion. No acute osseous findings.  IMPRESSION:  No acute cardiopulmonary disease.   Original Report Authenticated By: Malachy Moan, M.D.    Nursing Notes Reviewed/ Care Coordinated, and agree without changes. Applicable Imaging Reviewed Interpretation of Laboratory Data incorporated into ED treatment  1. Anxiety       MDM  Nonspecific complaints with normal evaluation. Doubt exacerbation of venous tunnel embolic disease or pulmonary embolus. The ACAs, pneumonia. Doubt metabolic instability, serious bacterial infection or impending vascular collapse; the patient is stable for discharge.     Plan: Home  Medications- usual; Home Treatments- rest; Recommended follow up- PCP for f/u of anxiety asap   Flint Melter, MD 01/25/13 1047

## 2013-01-30 ENCOUNTER — Inpatient Hospital Stay (HOSPITAL_COMMUNITY)
Admission: EM | Admit: 2013-01-30 | Discharge: 2013-02-02 | DRG: 897 | Disposition: A | Discharge status: Home / Self Care | Payer: PRIVATE HEALTH INSURANCE | Attending: Internal Medicine | Admitting: Internal Medicine

## 2013-01-30 ENCOUNTER — Encounter (HOSPITAL_COMMUNITY): Payer: Self-pay | Admitting: Emergency Medicine

## 2013-01-30 ENCOUNTER — Emergency Department (HOSPITAL_COMMUNITY): Payer: PRIVATE HEALTH INSURANCE

## 2013-01-30 DIAGNOSIS — F10939 Alcohol use, unspecified with withdrawal, unspecified: Principal | ICD-10-CM | POA: Diagnosis present

## 2013-01-30 DIAGNOSIS — F329 Major depressive disorder, single episode, unspecified: Secondary | ICD-10-CM | POA: Diagnosis present

## 2013-01-30 DIAGNOSIS — D72819 Decreased white blood cell count, unspecified: Secondary | ICD-10-CM

## 2013-01-30 DIAGNOSIS — I2699 Other pulmonary embolism without acute cor pulmonale: Secondary | ICD-10-CM

## 2013-01-30 DIAGNOSIS — D649 Anemia, unspecified: Secondary | ICD-10-CM

## 2013-01-30 DIAGNOSIS — F3289 Other specified depressive episodes: Secondary | ICD-10-CM | POA: Diagnosis present

## 2013-01-30 DIAGNOSIS — R7401 Elevation of levels of liver transaminase levels: Secondary | ICD-10-CM

## 2013-01-30 DIAGNOSIS — R45851 Suicidal ideations: Secondary | ICD-10-CM

## 2013-01-30 DIAGNOSIS — F10239 Alcohol dependence with withdrawal, unspecified: Principal | ICD-10-CM

## 2013-01-30 DIAGNOSIS — F17210 Nicotine dependence, cigarettes, uncomplicated: Secondary | ICD-10-CM | POA: Diagnosis present

## 2013-01-30 DIAGNOSIS — F1020 Alcohol dependence, uncomplicated: Secondary | ICD-10-CM | POA: Diagnosis present

## 2013-01-30 DIAGNOSIS — F10988 Alcohol use, unspecified with other alcohol-induced disorder: Secondary | ICD-10-CM | POA: Diagnosis present

## 2013-01-30 DIAGNOSIS — D61818 Other pancytopenia: Secondary | ICD-10-CM | POA: Diagnosis present

## 2013-01-30 DIAGNOSIS — C8589 Other specified types of non-Hodgkin lymphoma, extranodal and solid organ sites: Secondary | ICD-10-CM | POA: Diagnosis present

## 2013-01-30 DIAGNOSIS — Z8572 Personal history of non-Hodgkin lymphomas: Secondary | ICD-10-CM | POA: Diagnosis present

## 2013-01-30 DIAGNOSIS — Z86711 Personal history of pulmonary embolism: Secondary | ICD-10-CM

## 2013-01-30 DIAGNOSIS — F1010 Alcohol abuse, uncomplicated: Secondary | ICD-10-CM

## 2013-01-30 DIAGNOSIS — E875 Hyperkalemia: Secondary | ICD-10-CM | POA: Diagnosis present

## 2013-01-30 DIAGNOSIS — F17200 Nicotine dependence, unspecified, uncomplicated: Secondary | ICD-10-CM | POA: Diagnosis present

## 2013-01-30 LAB — COMPREHENSIVE METABOLIC PANEL
ALT: 248 U/L — ABNORMAL HIGH (ref 0–53)
AST: 452 U/L — ABNORMAL HIGH (ref 0–37)
Albumin: 3.7 g/dL (ref 3.5–5.2)
Alkaline Phosphatase: 80 U/L (ref 39–117)
BUN: 21 mg/dL (ref 6–23)
CO2: 27 mEq/L (ref 19–32)
Calcium: 9.1 mg/dL (ref 8.4–10.5)
Chloride: 98 mEq/L (ref 96–112)
Creatinine, Ser: 1.08 mg/dL (ref 0.50–1.35)
GFR calc Af Amer: 89 mL/min — ABNORMAL LOW (ref >90)
GFR calc non Af Amer: 77 mL/min — ABNORMAL LOW (ref >90)
Glucose, Bld: 118 mg/dL — ABNORMAL HIGH (ref 70–99)
Potassium: 6 mEq/L — ABNORMAL HIGH (ref 3.5–5.1)
Sodium: 139 mEq/L (ref 135–145)
Total Bilirubin: 1.1 mg/dL (ref 0.3–1.2)
Total Protein: 7.6 g/dL (ref 6.0–8.3)

## 2013-01-30 LAB — RAPID URINE DRUG SCREEN, HOSP PERFORMED
Amphetamines: NOT DETECTED
Barbiturates: NOT DETECTED
Benzodiazepines: NOT DETECTED
Cocaine: NOT DETECTED
Opiates: NOT DETECTED
Tetrahydrocannabinol: NOT DETECTED

## 2013-01-30 LAB — CBC
HCT: 41.3 % (ref 39.0–52.0)
Hemoglobin: 13.1 g/dL (ref 13.0–17.0)
MCH: 24.7 pg — ABNORMAL LOW (ref 26.0–34.0)
MCHC: 31.7 g/dL (ref 30.0–36.0)
MCV: 77.8 fL — ABNORMAL LOW (ref 78.0–100.0)
Platelets: 166 10*3/uL (ref 150–400)
RBC: 5.31 MIL/uL (ref 4.22–5.81)
RDW: 15.2 % (ref 11.5–15.5)
WBC: 2 10*3/uL — ABNORMAL LOW (ref 4.0–10.5)

## 2013-01-30 LAB — ETHANOL: Alcohol, Ethyl (B): 191 mg/dL — ABNORMAL HIGH (ref 0–11)

## 2013-01-30 LAB — POTASSIUM: Potassium: 4.6 mEq/L (ref 3.5–5.1)

## 2013-01-30 LAB — SALICYLATE LEVEL: Salicylate Lvl: <2 mg/dL — ABNORMAL LOW (ref 2.8–20.0)

## 2013-01-30 LAB — ACETAMINOPHEN LEVEL: Acetaminophen (Tylenol), Serum: <15 ug/mL (ref 10–30)

## 2013-01-30 MED ORDER — ADULT MULTIVITAMIN W/MINERALS CH
1.0000 | ORAL_TABLET | Freq: Every day | ORAL | Status: DC
  Administered 2013-01-30: 1 via ORAL
  Filled 2013-01-30 (×2): qty 1

## 2013-01-30 MED ORDER — LORAZEPAM 2 MG/ML IJ SOLN
2.0000 mg | Freq: Once | INTRAMUSCULAR | Status: AC
  Administered 2013-01-30: 2 mg via INTRAVENOUS
  Filled 2013-01-30: qty 1

## 2013-01-30 MED ORDER — IBUPROFEN 600 MG PO TABS
600.0000 mg | ORAL_TABLET | Freq: Three times a day (TID) | ORAL | Status: DC | PRN
  Filled 2013-01-30: qty 1

## 2013-01-30 MED ORDER — ONDANSETRON HCL 4 MG PO TABS: 4.0000 mg | ORAL_TABLET | Freq: Three times a day (TID) | ORAL | Status: DC | PRN

## 2013-01-30 MED ORDER — THIAMINE HCL 100 MG/ML IJ SOLN
100.0000 mg | Freq: Every day | INTRAMUSCULAR | Status: DC
  Filled 2013-01-30: qty 1

## 2013-01-30 MED ORDER — LORAZEPAM 1 MG PO TABS: 0.0000 mg | ORAL_TABLET | Freq: Two times a day (BID) | ORAL | Status: DC

## 2013-01-30 MED ORDER — ALUM & MAG HYDROXIDE-SIMETH 200-200-20 MG/5ML PO SUSP: 30.0000 mL | ORAL | Status: DC | PRN

## 2013-01-30 MED ORDER — LORAZEPAM 1 MG PO TABS
1.0000 mg | ORAL_TABLET | Freq: Three times a day (TID) | ORAL | Status: DC | PRN
  Administered 2013-01-31: 1 mg via ORAL
  Filled 2013-01-30: qty 1

## 2013-01-30 MED ORDER — ONDANSETRON HCL 4 MG/2ML IJ SOLN
4.0000 mg | Freq: Once | INTRAMUSCULAR | Status: AC
  Administered 2013-01-30: 4 mg via INTRAVENOUS
  Filled 2013-01-30: qty 2

## 2013-01-30 MED ORDER — LORAZEPAM 1 MG PO TABS
0.0000 mg | ORAL_TABLET | Freq: Four times a day (QID) | ORAL | Status: DC
  Administered 2013-01-30 – 2013-01-31 (×2): 2 mg via ORAL
  Filled 2013-01-30 (×2): qty 2

## 2013-01-30 MED ORDER — FOLIC ACID 1 MG PO TABS
1.0000 mg | ORAL_TABLET | Freq: Every day | ORAL | Status: DC
  Administered 2013-01-30: 1 mg via ORAL
  Filled 2013-01-30 (×2): qty 1

## 2013-01-30 MED ORDER — VITAMIN B-1 100 MG PO TABS
100.0000 mg | ORAL_TABLET | Freq: Every day | ORAL | Status: DC
  Administered 2013-01-30: 100 mg via ORAL
  Filled 2013-01-30 (×2): qty 1

## 2013-01-30 NOTE — ED Notes (Signed)
Rolly Salter, RN called [facility] for sitter.

## 2013-01-30 NOTE — ED Notes (Signed)
MD at bedside. 

## 2013-01-30 NOTE — ED Notes (Signed)
Pt from home requesting detox from alcohol. Pt reports SI but denies HI or AV hallucinations. Pt noted to have tremors of arms and upper body during triage and reports that he took medicine for tremors but threw it up. Pt reports last drink was of wine about 12 hours ago. Pt noted to be pale with cold hands/fingers which pt reports is not new. Pt endorses hx of lymphoma.

## 2013-01-30 NOTE — ED Provider Notes (Signed)
History     CSN: [id number]  Arrival date & time 01/30/13  4098   First MD Initiated Contact with Patient 01/30/13 1837      Chief Complaint  Patient presents with  . Medical Clearance    (Consider location/radiation/quality/duration/timing/severity/associated sxs/prior treatment) The history is provided by the patient.   patient is requesting detox off of alcohol. He states he drinks about a fifth digit a Filler. He's been drinking at this level for years. He is not on a significant period without drinking. He states that in the early afternoon he started to feel tremulous. His shaking now. He states he's had some nausea and feelings can throw up. No previous seizures. He was recently diagnosed with pulmonary embolism and is on treatment. He has a history of non-Hodgkin's lymphoma, but is reportedly cancer free. No suicidal thoughts. No depression.  Past Medical History  Diagnosis Date  . ED (erectile dysfunction)   . Cancer 07/2011    STAGE II NON- HODGKIN LYMPHOMA  . Lymphoma, non Hodgkin's 09/25/2011  . Pulmonary emboli   . Depression     Past Surgical History  Procedure Laterality Date  . No past surgeries      Family History  Problem Relation Age of Onset  . Cancer Mother     BREAST(BONE)  . Cancer Father     PANCREATIC    History  Substance Use Topics  . Smoking status: Current Every Stecher Smoker -- 1.00 packs/Manke for 32 years    Types: Cigarettes  . Smokeless tobacco: Never Used  . Alcohol Use: Yes     Comment: 5th a Coey of hard liquor      Review of Systems  Constitutional: Negative for activity change and appetite change.  HENT: Negative for neck stiffness.   Eyes: Negative for pain.  Respiratory: Negative for chest tightness and shortness of breath.   Cardiovascular: Negative for chest pain and leg swelling.  Gastrointestinal: Positive for nausea and vomiting. Negative for abdominal pain and diarrhea.  Genitourinary: Negative for flank pain.   Musculoskeletal: Negative for back pain.  Skin: Negative for rash.  Neurological: Positive for tremors. Negative for weakness, numbness and headaches.  Psychiatric/Behavioral: Negative for behavioral problems.    Allergies  Review of patient's allergies indicates no known allergies.  Home Medications   Current Outpatient Rx  Name  Route  Sig  Dispense  Refill  . gabapentin (NEURONTIN) 300 MG capsule   Oral   Take 300 mg by mouth 3 (three) times daily.         . Rivaroxaban (XARELTO) 15 MG TABS tablet   Oral   Take 15 mg by mouth 2 (two) times daily with a meal.         . thiamine 100 MG tablet   Oral   Take 1 tablet (100 mg total) by mouth daily.   30 tablet   0     BP 135/74  Pulse 90  Temp(Src) 97 F (36.1 C) (Tympanic)  Resp 18  SpO2 %  Physical Exam  Nursing note and vitals reviewed. Constitutional: He is oriented to person, place, and time. He appears well-developed and well-nourished.  HENT:  Head: Normocephalic and atraumatic.  Eyes: EOM are normal. Pupils are equal, round, and reactive to light.  Neck: Normal range of motion. Neck supple.  Cardiovascular: Normal rate, regular rhythm and normal heart sounds.   No murmur heard. Pulmonary/Chest: Effort normal and breath sounds normal.  Abdominal: Soft. Bowel sounds are normal. He  exhibits no distension and no mass. There is no tenderness. There is no rebound and no guarding.  Musculoskeletal: Normal range of motion. He exhibits no edema.  Neurological: He is alert and oriented to person, place, and time. No cranial nerve deficit.  Mild tremors to bilateral upper extremities. Sensation intact.  Skin: Skin is warm and dry.  Psychiatric: He has a normal mood and affect.    ED Course  Procedures (including critical care time)  Labs Reviewed  CBC - Abnormal; Notable for the following:    WBC 2.0 (*)    MCV 77.8 (*)    MCH 24.7 (*)    All other components within normal limits  COMPREHENSIVE  METABOLIC PANEL - Abnormal; Notable for the following:    Potassium 6.0 (*)    Glucose, Bld 118 (*)    AST 452 (*)    ALT 248 (*)    GFR calc non Af Amer 77 (*)    GFR calc Af Amer 89 (*)    All other components within normal limits  ETHANOL - Abnormal; Notable for the following:    Alcohol, Ethyl (B) 191 (*)    All other components within normal limits  SALICYLATE LEVEL - Abnormal; Notable for the following:    Salicylate Lvl <2.0 (*)    All other components within normal limits  ACETAMINOPHEN LEVEL  URINE RAPID DRUG SCREEN (HOSP PERFORMED)  POTASSIUM   Dg Chest 2 View  01/30/2013  *RADIOLOGY REPORT*  Clinical Data: shortness of breath  CHEST - 2 VIEW  Comparison:  01/25/13  Findings:  The heart size and mediastinal contours are within normal limits.  Both lungs are clear.  The visualized skeletal structures are unremarkable.  IMPRESSION: No active cardiopulmonary disease.   Original Report Authenticated By: Judie Petit. Shick, M.D.      1. Alcohol abuse   2. Transaminitis      Date: 01/30/2013  Rate: 77  Rhythm: normal sinus rhythm and premature atrial contractions (PAC)  QRS Axis: normal  Intervals: normal  ST/T Wave abnormalities: normal  Conduction Disutrbances:none  Narrative Interpretation:   Old EKG Reviewed: unchanged    MDM  Patient is requesting detox off alcohol. He drinks a fifth a [place]. Lab work is overall reassuring but has elevated liver enzymes which appears to be chronic for him. He was somewhat tremulous but is improved with Ativan. He appears to be medically cleared pending continued control of withdrawal symptoms with Ativan and will be seen by the ACT team.        Juliet Rude. Rubin Payor, MD 01/30/13 2329

## 2013-01-30 NOTE — ED Notes (Signed)
Pt reports that he has vomited about 5 times since this morning.

## 2013-01-30 NOTE — ED Notes (Signed)
One book bag and a personal belonging bag in locker 36

## 2013-01-31 ENCOUNTER — Encounter (HOSPITAL_COMMUNITY): Payer: Self-pay | Admitting: Internal Medicine

## 2013-01-31 DIAGNOSIS — R45851 Suicidal ideations: Secondary | ICD-10-CM

## 2013-01-31 DIAGNOSIS — R7401 Elevation of levels of liver transaminase levels: Secondary | ICD-10-CM

## 2013-01-31 DIAGNOSIS — F10239 Alcohol dependence with withdrawal, unspecified: Principal | ICD-10-CM

## 2013-01-31 DIAGNOSIS — F1010 Alcohol abuse, uncomplicated: Secondary | ICD-10-CM

## 2013-01-31 DIAGNOSIS — I2699 Other pulmonary embolism without acute cor pulmonale: Secondary | ICD-10-CM

## 2013-01-31 DIAGNOSIS — F1994 Other psychoactive substance use, unspecified with psychoactive substance-induced mood disorder: Secondary | ICD-10-CM

## 2013-01-31 DIAGNOSIS — R7402 Elevation of levels of lactic acid dehydrogenase (LDH): Secondary | ICD-10-CM

## 2013-01-31 DIAGNOSIS — F1020 Alcohol dependence, uncomplicated: Secondary | ICD-10-CM

## 2013-01-31 DIAGNOSIS — D72819 Decreased white blood cell count, unspecified: Secondary | ICD-10-CM

## 2013-01-31 LAB — CBC WITH DIFFERENTIAL/PLATELET
Basophils Absolute: 0 10*3/uL (ref 0.0–0.1)
Basophils Relative: 0 % (ref 0–1)
Eosinophils Absolute: 0.1 10*3/uL (ref 0.0–0.7)
Eosinophils Relative: 2 % (ref 0–5)
HCT: 34.6 % — ABNORMAL LOW (ref 39.0–52.0)
Hemoglobin: 11.5 g/dL — ABNORMAL LOW (ref 13.0–17.0)
Lymphocytes Relative: 26 % (ref 12–46)
Lymphs Abs: 0.7 10*3/uL (ref 0.7–4.0)
MCH: 25.8 pg — ABNORMAL LOW (ref 26.0–34.0)
MCHC: 33.2 g/dL (ref 30.0–36.0)
MCV: 77.8 fL — ABNORMAL LOW (ref 78.0–100.0)
Monocytes Absolute: 0.4 10*3/uL (ref 0.1–1.0)
Monocytes Relative: 16 % — ABNORMAL HIGH (ref 3–12)
Neutro Abs: 1.5 10*3/uL — ABNORMAL LOW (ref 1.7–7.7)
Neutrophils Relative %: 56 % (ref 43–77)
Platelets: 136 10*3/uL — ABNORMAL LOW (ref 150–400)
RBC: 4.45 MIL/uL (ref 4.22–5.81)
RDW: 15.2 % (ref 11.5–15.5)
WBC: 2.7 10*3/uL — ABNORMAL LOW (ref 4.0–10.5)

## 2013-01-31 LAB — BASIC METABOLIC PANEL
BUN: 18 mg/dL (ref 6–23)
CO2: 29 mEq/L (ref 19–32)
Calcium: 8.9 mg/dL (ref 8.4–10.5)
Chloride: 93 mEq/L — ABNORMAL LOW (ref 96–112)
Creatinine, Ser: 0.87 mg/dL (ref 0.50–1.35)
GFR calc Af Amer: >90 mL/min (ref >90)
GFR calc non Af Amer: >90 mL/min (ref >90)
Glucose, Bld: 104 mg/dL — ABNORMAL HIGH (ref 70–99)
Potassium: 4.3 mEq/L (ref 3.5–5.1)
Sodium: 131 mEq/L — ABNORMAL LOW (ref 135–145)

## 2013-01-31 LAB — ETHANOL: Alcohol, Ethyl (B): 57 mg/dL — ABNORMAL HIGH (ref 0–11)

## 2013-01-31 LAB — LIPASE, BLOOD: Lipase: 131 U/L — ABNORMAL HIGH (ref 11–59)

## 2013-01-31 MED ORDER — RIVAROXABAN 15 MG PO TABS: 15.0000 mg | ORAL_TABLET | Freq: Two times a day (BID) | ORAL | Status: DC

## 2013-01-31 MED ORDER — ADULT MULTIVITAMIN W/MINERALS CH
1.0000 | ORAL_TABLET | Freq: Every day | ORAL | Status: DC
  Administered 2013-01-31 – 2013-02-02 (×3): 1 via ORAL
  Filled 2013-01-31 (×3): qty 1

## 2013-01-31 MED ORDER — LORAZEPAM 1 MG PO TABS: 1.0000 mg | ORAL_TABLET | Freq: Four times a day (QID) | ORAL | Status: DC | PRN

## 2013-01-31 MED ORDER — DIAZEPAM 5 MG/ML IJ SOLN
5.0000 mg | Freq: Four times a day (QID) | INTRAMUSCULAR | Status: DC
  Administered 2013-01-31 – 2013-02-02 (×8): 5 mg via INTRAVENOUS
  Filled 2013-01-31 (×8): qty 2

## 2013-01-31 MED ORDER — SODIUM CHLORIDE 0.9 % IJ SOLN
3.0000 mL | Freq: Two times a day (BID) | INTRAMUSCULAR | Status: DC
  Administered 2013-01-31 – 2013-02-02 (×4): 3 mL via INTRAVENOUS

## 2013-01-31 MED ORDER — VITAMIN B-1 100 MG PO TABS
100.0000 mg | ORAL_TABLET | Freq: Every day | ORAL | Status: DC
  Administered 2013-01-31 – 2013-02-02 (×3): 100 mg via ORAL
  Filled 2013-01-31 (×3): qty 1

## 2013-01-31 MED ORDER — LORAZEPAM 2 MG/ML IJ SOLN
1.0000 mg | Freq: Four times a day (QID) | INTRAMUSCULAR | Status: DC | PRN
  Filled 2013-01-31 (×5): qty 1

## 2013-01-31 MED ORDER — NICOTINE 21 MG/24HR TD PT24
21.0000 mg | MEDICATED_PATCH | Freq: Every morning | TRANSDERMAL | Status: DC
  Administered 2013-01-31: 21 mg via TRANSDERMAL
  Filled 2013-01-31: qty 1

## 2013-01-31 MED ORDER — PANTOPRAZOLE SODIUM 40 MG PO TBEC
40.0000 mg | DELAYED_RELEASE_TABLET | Freq: Every day | ORAL | Status: DC
  Administered 2013-01-31 – 2013-02-01 (×2): 40 mg via ORAL
  Filled 2013-01-31 (×5): qty 1

## 2013-01-31 MED ORDER — LORAZEPAM 2 MG/ML IJ SOLN
0.0000 mg | Freq: Four times a day (QID) | INTRAMUSCULAR | Status: AC
  Administered 2013-01-31 (×3): 1 mg via INTRAVENOUS
  Administered 2013-02-01: 0 mg via INTRAVENOUS
  Administered 2013-02-01: 1 mg via INTRAVENOUS
  Administered 2013-02-01: 2 mg via INTRAVENOUS
  Administered 2013-02-01: 1 mg via INTRAVENOUS
  Filled 2013-01-31: qty 1

## 2013-01-31 MED ORDER — ACETAMINOPHEN 650 MG RE SUPP: 650.0000 mg | Freq: Four times a day (QID) | RECTAL | Status: DC | PRN

## 2013-01-31 MED ORDER — LORAZEPAM 2 MG/ML IJ SOLN: 0.0000 mg | Freq: Two times a day (BID) | INTRAMUSCULAR | Status: DC

## 2013-01-31 MED ORDER — RIVAROXABAN 20 MG PO TABS
20.0000 mg | ORAL_TABLET | Freq: Every day | ORAL | Status: DC
  Administered 2013-01-31 – 2013-02-02 (×3): 20 mg via ORAL
  Filled 2013-01-31 (×3): qty 1

## 2013-01-31 MED ORDER — FOLIC ACID 1 MG PO TABS
1.0000 mg | ORAL_TABLET | Freq: Every day | ORAL | Status: DC
  Administered 2013-01-31 – 2013-02-02 (×3): 1 mg via ORAL
  Filled 2013-01-31 (×3): qty 1

## 2013-01-31 MED ORDER — ACETAMINOPHEN 325 MG PO TABS: 650.0000 mg | ORAL_TABLET | Freq: Four times a day (QID) | ORAL | Status: DC | PRN

## 2013-01-31 MED ORDER — ONDANSETRON HCL 4 MG/2ML IJ SOLN: 4.0000 mg | Freq: Four times a day (QID) | INTRAMUSCULAR | Status: DC | PRN

## 2013-01-31 MED ORDER — GABAPENTIN 300 MG PO CAPS
300.0000 mg | ORAL_CAPSULE | Freq: Three times a day (TID) | ORAL | Status: DC
  Administered 2013-01-31 – 2013-02-02 (×7): 300 mg via ORAL
  Filled 2013-01-31 (×9): qty 1

## 2013-01-31 MED ORDER — PANTOPRAZOLE SODIUM 40 MG IV SOLR
40.0000 mg | Freq: Every day | INTRAVENOUS | Status: DC
  Filled 2013-01-31: qty 40

## 2013-01-31 MED ORDER — ONDANSETRON HCL 4 MG PO TABS: 4.0000 mg | ORAL_TABLET | Freq: Four times a day (QID) | ORAL | Status: DC | PRN

## 2013-01-31 MED ORDER — SODIUM CHLORIDE 0.9 % IV SOLN
INTRAVENOUS | Status: AC
  Administered 2013-01-31 – 2013-02-01 (×3): via INTRAVENOUS

## 2013-01-31 MED ORDER — THIAMINE HCL 100 MG/ML IJ SOLN
100.0000 mg | Freq: Every day | INTRAMUSCULAR | Status: DC
  Filled 2013-01-31 (×3): qty 1

## 2013-01-31 MED ORDER — ONDANSETRON HCL 4 MG/2ML IJ SOLN
4.0000 mg | Freq: Three times a day (TID) | INTRAMUSCULAR | Status: DC | PRN
Start: 2013-01-31 — End: 2013-01-31

## 2013-01-31 MED ORDER — VITAMIN B-1 100 MG PO TABS: 100.0000 mg | ORAL_TABLET | Freq: Every day | ORAL | Status: DC

## 2013-01-31 MED ORDER — SODIUM CHLORIDE 0.9 % IV SOLN: INTRAVENOUS | Status: DC

## 2013-01-31 NOTE — ED Notes (Signed)
Patient complains anxiety. Respirations and unlabored. Skin warm and dry. No acute distress noted.

## 2013-01-31 NOTE — Care Management Note (Addendum)
    Page 1 of 1   02/02/2013     11:02:48 AM   CARE MANAGEMENT NOTE 02/02/2013  Patient:  Leonard Bentley,Leonard Bentley   Account Number:  [id number]  Date Initiated:  01/31/2013  Documentation initiated by:  Lanier Clam  Subjective/Objective Assessment:   ADMITTED W/ALCOHOL ABUSE.ZO:XWRUEAVW IDEATION,PE,NHL.     Action/Plan:   DECLINED FROM [facility] D/T MEDICAL ACUITY.   Anticipated DC Date:  02/02/2013   Anticipated DC Plan:  HOME/SELF CARE      DC Planning Services  CM consult      Choice offered to / List presented to:             Status of service:  Completed, signed off Medicare Important Message given?   (If response is "NO", the following Medicare IM given date fields will be blank) Date Medicare IM given:   Date Additional Medicare IM given:    Discharge Disposition:  HOME/SELF CARE  Per UR Regulation:  Reviewed for med. necessity/level of care/duration of stay  If discussed at Long Length of Stay Meetings, dates discussed:    Comments:  02/02/13 Wilfrido Luedke RN,BSN NCM [phone] D/C HOME W/OTPT REHAB F/U.  02/01/13 Ledarius Leeson RN,BSN NCM [phone] PSYCH SW FOLLOWING-IOP RESOURCES/AA.AWAIT PSYCH CONS.  01/31/13 Grae Cannata RN,BSN NCM [phone] 1:1 SITTER,PSYCH CONS.

## 2013-01-31 NOTE — Progress Notes (Signed)
Chaplain responded to spiritual care consult - pt detox.    Provided emotional and spiritual support around pt's hospitalization, plans for discharge and plans for support.   Pt reported his use of ETOH increases around time of follow up scans for lymphoma.  Pt and chaplain discussed grief around lymphoma and ways in which he felt he had pushed others away and not been able to invest in things that are important to his well-being.  Pt and chaplain spoke about pt's desire to reclaim some of these activities.  Also his tendency to over-invest in interests to the detriment of relationships.   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  Hoping to invest in relationship with daughter.   Leonard Bentley  12:43 PM

## 2013-01-31 NOTE — Progress Notes (Signed)
PHARMACIST - PHYSICIAN COMMUNICATION CONCERNING:  IV to Oral Route Change Policy  RECOMMENDATION: This patient is receiving Protonix by the intravenous route.  Based on criteria approved by the Pharmacy and Therapeutics Committee,Protonix is being converted to the equivalent oral dose form(s).  DESCRIPTION: These criteria include:  Has a documented ability to take oral medications (tolerating diet of full liquids or better or  Gastric tube feedings for >24 hours OR taking other scheduled oral medications for >24 hours).  Expected plan for continued treatment for at least 1 Tippin.  If you have questions about this conversion, please contact the Pharmacy Department  []   [phone] )  [facility] []   [phone] )  [facility]  []   [phone] )  [facility] [x]   [phone] )  [facility]   Geoffry Paradise, PharmD, BCPS Pager: [phone] 7:58 AM Pharmacy #: [phone]

## 2013-01-31 NOTE — BH Assessment (Signed)
Assessment Note   Leonard Bentley is an 52 y.o. male, states that he is here for alcohol poisoning and withdrawal.  Pt reports a twenty year history of drinking wine daily.  For the last two years he reports2/3 of a fifth of GIN daily.  Pt reports his last drink of 1/2 bottle of wine(1.5 liter) this morning.  Pt stated that his drinking begun at the age of 64.  Pt. denied any other substances used.  Pt. denied any substance abuse treatment. Pt. presently denies SI/HI and Visual and auditory hallucinations. Pt appeared disheveled cooperative and pleasant.   Pt appeared logical and coherent.  Pt orientated to person, place, time and situation.  No delusions or loose associations noted.   Pt endorses the following withdrawal symptoms:  hopelessness, guilt, chills, nausea, vomiting and agitation.  Pt initially presented with a  potassium level of 6.6, but now it has decreased to 4.6.  Pt BAL at admission was 191 with elevated LFT.  Pt reports that he detoxed at [facility] in August 2013.  He reports that he has a supportive brother.  Pt denies any criminal charges. Pt reports family history of alcoholism (Father).  Pt reported that his sleep has decreased and that he only get 3 hours of sleep. Pt reports that he stays up drinking and smoking.   Pt denies any history of abuse (verbal and physical).  Pt reported that he has a medical history of lymphoma, which is currently in remission.  Pt requested to be detox in ER, but doesn't want inpatient admission.  ACT assessor spoke with Dr. Maren Reamer regarding disposition and decided to obtain another BAL and then D/C home if patient is refusing inpatient substance abuse treatment.     Axis I: Alcohol dependence and MDD Axis II: Deferred Axis III:  Past Medical History  Diagnosis Date  . ED (erectile dysfunction)   . Cancer 07/2011    STAGE II NON- HODGKIN LYMPHOMA  . Lymphoma, non Hodgkin's 09/25/2011  . Pulmonary emboli   . Depression    Axis IV: other psychosocial or  environmental problems and problems related to social environment Axis V: 41-50 serious symptoms  Past Medical History:  Past Medical History  Diagnosis Date  . ED (erectile dysfunction)   . Cancer 07/2011    STAGE II NON- HODGKIN LYMPHOMA  . Lymphoma, non Hodgkin's 09/25/2011  . Pulmonary emboli   . Depression     Past Surgical History  Procedure Laterality Date  . No past surgeries      Family History:  Family History  Problem Relation Age of Onset  . Cancer Mother     BREAST(BONE)  . Cancer Father     PANCREATIC    Social History:  reports that he has been smoking Cigarettes.  He has a 32 pack-year smoking history. He has never used smokeless tobacco. He reports that  drinks alcohol. He reports that he does not use illicit drugs.  Additional Social History:     CIWA: CIWA-Ar BP: 117/88 mmHg Pulse Rate: 80 Nausea and Vomiting: no nausea and no vomiting Tactile Disturbances: none Tremor: five Auditory Disturbances: not present Paroxysmal Sweats: no sweat visible Visual Disturbances: not present Anxiety: five Headache, Fullness in Head: very mild Agitation: normal activity Orientation and Clouding of Sensorium: oriented and can do serial additions CIWA-Ar Total: 11 COWS:    Allergies: No Known Allergies  Home Medications:  (Not in a hospital admission)  OB/GYN Status:  No LMP for male patient.  General Assessment Data Location of Assessment: [facility] ED ACT Assessment: Yes Living Arrangements: Alone Can pt return to current living arrangement?: Yes Admission Status: Involuntary Is patient capable of signing voluntary admission?: Yes Transfer from: Home Referral Source: Self/Family/Friend     Risk to self Suicidal Ideation: No Suicidal Intent: No Is patient at risk for suicide?: No Suicidal Plan?: No Access to Means: No What has been your use of drugs/alcohol within the last 12 months?:  (Alcohol) Previous Attempts/Gestures: No How many times?:   (none) Triggers for Past Attempts: None known Intentional Self Injurious Behavior: None Family Suicide History: No Recent stressful life event(s): Other (Comment) Persecutory voices/beliefs?: No Depression: Yes Depression Symptoms: Insomnia;Guilt;Feeling worthless/self pity;Feeling angry/irritable Substance abuse history and/or treatment for substance abuse?: Yes Suicide prevention information given to non-admitted patients: Not applicable  Risk to Others Homicidal Ideation: No Thoughts of Harm to Others: No Current Homicidal Intent: No Current Homicidal Plan: No Access to Homicidal Means:  (none noted) Identified Victim:  (none noted) History of harm to others?: No Assessment of Violence: None Noted Violent Behavior Description:  ( none reported) Does patient have access to weapons?: No Criminal Charges Pending?: No Does patient have a court date: No  Psychosis Hallucinations: None noted Delusions: None noted  Mental Status Report Appear/Hygiene: Disheveled Eye Contact: Fair Motor Activity: Tremors Speech: Logical/coherent Level of Consciousness: Alert Mood: Depressed Affect: Anxious Anxiety Level: Moderate Thought Processes: Coherent Judgement: Impaired Orientation: Person;Place;Time;Situation Obsessive Compulsive Thoughts/Behaviors: None  Cognitive Functioning Concentration: Normal Memory: Recent Intact;Remote Intact IQ: Average Insight: Fair Impulse Control: Poor Appetite: Fair Weight Loss:  (none reported) Weight Gain:  (none reported) Sleep: Decreased Total Hours of Sleep:  (3 hours) Vegetative Symptoms: Decreased grooming  ADLScreening [facility] Assessment Services) Patient's cognitive ability adequate to safely complete daily activities?: Yes Patient able to express need for assistance with ADLs?: Yes Independently performs ADLs?: Yes (appropriate for developmental age)  Abuse/Neglect [facility]) Physical Abuse: Denies Verbal Abuse: Denies Sexual Abuse:  Denies  Prior Inpatient Therapy Prior Inpatient Therapy: Yes Prior Therapy Dates:  [facility] in 2013) Prior Therapy Facilty/Provider(s):  ([facility]) Reason for Treatment:  (Alcohol detox)  Prior Outpatient Therapy Prior Outpatient Therapy: Yes Prior Therapy Dates:  (current) Prior Therapy Facilty/Provider(s):  (Pt is seeing a [facility] [employer] [facility]) Reason for Treatment:  (depression and alcohol)  ADL Screening (condition at time of admission) Patient's cognitive ability adequate to safely complete daily activities?: Yes Patient able to express need for assistance with ADLs?: Yes Independently performs ADLs?: Yes (appropriate for developmental age)       Abuse/Neglect Assessment (Assessment to be complete while patient is alone) Physical Abuse: Denies Verbal Abuse: Denies Sexual Abuse: Denies Values / Beliefs Cultural Requests During Hospitalization: None Spiritual Requests During Hospitalization: None        Additional Information 1:1 In Past 12 Months?: No CIRT Risk: No Elopement Risk: No Does patient have medical clearance?: Yes     Disposition:  Disposition Initial Assessment Completed for this Encounter:  (yes) Disposition of Patient: Referred to [facility]  On Site Evaluation by:   Reviewed with Physician:     Tennis Must 01/31/2013 12:44 AM

## 2013-01-31 NOTE — Progress Notes (Signed)
[facility] PROGRESS NOTE  Leonard Bentley ZOX:[id number] DOB: [birth year] DOA: 01/30/2013 PCP: Leonard Herter, MD  Brief narrative: 52 year old male with history of non-Hodgkin's lymphoma she is in remission with a recently diagnosed PE and being treated on xarelto  presented to the ED requesting detox for alcohol. he also had suicidal ideations on  Presentation.  Assessment/Plan: Acute alcohol withdrawal Patient showing early signs of withdrawal. Continue CIWA. I have started him on scheduled Valium. Continue thiamine, folate and multivitamin. Has high risk for wound into DTs. We'll closely monitor on telemetry. I have discussed with him and the option for inpatient alcohol rehabilitation once he is clinically stable and he agrees to discuss with the Child psychotherapist.  Suicidal ideation Patient denies any suicidal thoughts at the present. However feels depressed.psychiatry consult called. Will DC sitter.  Recently diagnosed PE Continue on xarelto . Change dose to 20 mg once a [place]. Given history of heavy alcohol abuse he is at a high risk for fall and injury with bleeding. I haven't discussed about this in detail with him. He informs that there have been instances where he almost fell and hit his head.  Nausea and vomiting Possibly in the setting of alcohol withdrawal. Noted for transaminitis on labs. Exam is benign. Asymptomatic at present. Will trend LFTs.  Leukopenia Likely in the setting off alcohol use. Continue to monitor  Hyperkalemia Present on admission and now resolved.  History of non-Hodgkin's lymphoma in admission. Follows with Leonard Bentley  Code Status: Full code Family Communication: None at bedside Disposition Plan: Discussed option for inpatient alcohol rehabilitation and he agrees to discuss with the social worker.   Consultants:  Psychiatry  Procedures:   none  Antibiotics:  None  HPI/Subjective: Patient denies any suicidal ideations however feels  a depressed. He informs drinking 2/5-3/5 of liquor every Rester. Denies any withdrawal symptoms.  Objective: Filed Vitals:   01/30/13 2348 01/31/13 0422 01/31/13 0520 01/31/13 0604  BP: 117/88 117/81 117/81 120/85  Pulse: 80 98 98 110  Temp: 97.5 F (36.4 C)  97.5 F (36.4 C) 98.2 F (36.8 C)  TempSrc: Oral  Oral Oral  Resp: 18  18 19   Height:    5\' 10"  (1.778 m)  Weight:    77 kg (169 lb 12.1 oz)  SpO2: 99%  97% 100%    Intake/Output Summary (Last 24 hours) at 01/31/13 1357 Last data filed at 01/31/13 0803  Gross per 24 hour  Intake    240 ml  Output      0 ml  Net    240 ml   Filed Weights   01/31/13 0604  Weight: 77 kg (169 lb 12.1 oz)    Exam:   General:  52 year old male appears restless in bed  Cardiovascular: S1&S2 tachycardic, no murmurs, rubs or gallop  Respiratory: Clear to auscultation bilaterally, no added sounds  Abdomen: Soft, nontender, nondistended, bowel sounds present  Musculoskeletal: Warm, no edema  CNS: AAO x3, fine tremors  Data Reviewed: Basic Metabolic Panel:  Recent Labs Lab 01/25/13 0740 01/30/13 1840 01/30/13 2022 01/31/13 0740  NA 135 139  --  131*  K 4.2 6.0* 4.6 4.3  CL 96 98  --  93*  CO2 26 27  --  29  GLUCOSE 96 118*  --  104*  BUN 19 21  --  18  CREATININE 0.85 1.08  --  0.87  CALCIUM 8.7 9.1  --  8.9   Liver Function Tests:  Recent Labs Lab  01/25/13 0740 01/30/13 1840  AST 181* 452*  ALT 101* 248*  ALKPHOS 55 80  BILITOT 0.7 1.1  PROT 6.7 7.6  ALBUMIN 3.4* 3.7    Recent Labs Lab 01/31/13 0740  LIPASE 131*   No results found for this basename: AMMONIA,  in the last 168 hours CBC:  Recent Labs Lab 01/25/13 0740 01/30/13 1840 01/31/13 0740  WBC 2.1* 2.0* 2.7*  NEUTROABS 1.1*  --  1.5*  HGB 12.1* 13.1 11.5*  HCT 36.6* 41.3 34.6*  MCV 78.0 77.8* 77.8*  PLT 119* 166 136*   Cardiac Enzymes: No results found for this basename: CKTOTAL, CKMB, CKMBINDEX, TROPONINI,  in the last 168 hours BNP (last  3 results) No results found for this basename: PROBNP,  in the last 8760 hours CBG: No results found for this basename: GLUCAP,  in the last 168 hours  No results found for this or any previous visit (from the past 240 hour(s)).   Studies: Dg Chest 2 View  01/30/2013  *RADIOLOGY REPORT*  Clinical Data: shortness of breath  CHEST - 2 VIEW  Comparison:  01/25/13  Findings:  The heart size and mediastinal contours are within normal limits.  Both lungs are clear.  The visualized skeletal structures are unremarkable.  IMPRESSION: No active cardiopulmonary disease.   Original Report Authenticated By: Judie Petit. Shick, M.D.     Scheduled Meds: . diazepam  5 mg Intravenous Q6H  . folic acid  1 mg Oral Daily  . gabapentin  300 mg Oral TID  . LORazepam  0-4 mg Intravenous Q6H   Followed by  . [START ON 02/02/2013] LORazepam  0-4 mg Intravenous Q12H  . multivitamin with minerals  1 tablet Oral Daily  . pantoprazole  40 mg Oral QHS  . Rivaroxaban  20 mg Oral Daily  . sodium chloride  3 mL Intravenous Q12H  . thiamine  100 mg Oral Daily   Or  . thiamine  100 mg Intravenous Daily   Continuous Infusions: . sodium chloride 125 mL/hr at 01/31/13 0719       Time spent: 20 minutes    Leonard Bentley  [facility] Pager [phone] If 7PM-7AM, please contact night-coverage at [email or web address], password [email or web address] 01/31/2013, 1:57 PM  LOS: 1 [place]

## 2013-01-31 NOTE — ED Provider Notes (Signed)
Patient declined at behavioral health due to medical acuity. His last drink was 6 PM last night and he has had continuous shakes and tremors. CIWA scores have been 8-12.  Denies any previous history of seizures. Patient has had 7 mg ativan since 730 pm. Dr. Toniann Fail agrees to admit patient for alcohol withdrawal and detox.  BP 117/81  Pulse 98  Temp(Src) 97.5 F (36.4 C) (Oral)  Resp 18  SpO2 99%   Glynn Octave, MD 01/31/13 [phone]

## 2013-01-31 NOTE — H&P (Signed)
[facility] History and Physical  Percy Winterrowd Unterreiner WJX:[id number] DOB: [birth year] DOA: 01/30/2013  Referring physician: Dr.Rancour. PCP: Carollee Herter, MD  Specialists: None.  Chief Complaint: Alcohol detox.  HPI: Leonard Bentley is a 52 y.o. male with history of non-Hodgkin's lymphoma in remission and recently diagnosed pulmonary embolism on xarelto presented to the ER for alcohol detox. Patient has been drinking daily usually gin but recently has been drinking wine. Patient on admission in the ER did have suicidal ideation but had no plans to execute it. Behavioral health was consulted but felt that due to patient's acuity patient is to be managed by medical team. Patient had initially 2-3 episodes of nausea and vomiting but denies any abdominal pain chest pain or shortness of breath. Patient is mildly tremulous but does not have any hallucination or is not agitated at this time. Patient will be admitted for alcohol detox with close monitoring for delirium.  Review of Systems: As presented in the history of presenting illness, rest negative.  Past Medical History  Diagnosis Date  . ED (erectile dysfunction)   . Cancer 07/2011    STAGE II NON- HODGKIN LYMPHOMA  . Lymphoma, non Hodgkin's 09/25/2011  . Pulmonary emboli   . Depression    Past Surgical History  Procedure Laterality Date  . No past surgeries     Social History:  reports that he has been smoking Cigarettes.  He has a 32 pack-year smoking history. He has never used smokeless tobacco. He reports that  drinks alcohol. He reports that he does not use illicit drugs. Lives at home. where does patient live-- Can do ADLs. Can patient participate in ADLs?  No Known Allergies  Family History  Problem Relation Age of Onset  . Cancer Mother     BREAST(BONE)  . Cancer Father     PANCREATIC      Prior to Admission medications   Medication Sig Start Date End Date Taking? Authorizing Provider  gabapentin (NEURONTIN) 300 MG  capsule Take 300 mg by mouth 3 (three) times daily.   Yes Historical Provider, MD  Rivaroxaban (XARELTO) 15 MG TABS tablet Take 15 mg by mouth 2 (two) times daily with a meal. 01/01/13  Yes Catarina Hartshorn, MD  thiamine 100 MG tablet Take 1 tablet (100 mg total) by mouth daily. 01/01/13  Yes Catarina Hartshorn, MD   Physical Exam: Filed Vitals:   01/30/13 2335 01/30/13 2348 01/31/13 0422 01/31/13 0520  BP: 117/88 117/88 117/81 117/81  Pulse: 80 80 98 98  Temp:  97.5 F (36.4 C)  97.5 F (36.4 C)  TempSrc:  Oral  Oral  Resp:  18  18  SpO2:  99%  97%     General:  Well developed and nourished.  Eyes: Anicteric no pallor.  ENT: No discharge from the ears eyes nose or mouth.  Neck: No mass or JVD felt.  Cardiovascular: S1-S2 heard.  Respiratory: No rhonchi or crepitations.  Abdomen: Soft nontender bowel sounds present.  Skin: No rash.  Musculoskeletal: No edema.  Psychiatric: Had suicidal ideations initially.  Neurologic: Alert awake oriented to time place and person. Moves all extremities. Mildly tremulous.  Labs on Admission:  Basic Metabolic Panel:  Recent Labs Lab 01/25/13 0740 01/30/13 1840 01/30/13 2022  NA 135 139  --   K 4.2 6.0* 4.6  CL 96 98  --   CO2 26 27  --   GLUCOSE 96 118*  --   BUN 19 21  --   CREATININE  0.85 1.08  --   CALCIUM 8.7 9.1  --    Liver Function Tests:  Recent Labs Lab 01/25/13 0740 01/30/13 1840  AST 181* 452*  ALT 101* 248*  ALKPHOS 55 80  BILITOT 0.7 1.1  PROT 6.7 7.6  ALBUMIN 3.4* 3.7   No results found for this basename: LIPASE, AMYLASE,  in the last 168 hours No results found for this basename: AMMONIA,  in the last 168 hours CBC:  Recent Labs Lab 01/25/13 0740 01/30/13 1840  WBC 2.1* 2.0*  NEUTROABS 1.1*  --   HGB 12.1* 13.1  HCT 36.6* 41.3  MCV 78.0 77.8*  PLT 119* 166   Cardiac Enzymes: No results found for this basename: CKTOTAL, CKMB, CKMBINDEX, TROPONINI,  in the last 168 hours  BNP (last 3 results) No  results found for this basename: PROBNP,  in the last 8760 hours CBG: No results found for this basename: GLUCAP,  in the last 168 hours  Radiological Exams on Admission: Dg Chest 2 View  01/30/2013  *RADIOLOGY REPORT*  Clinical Data: shortness of breath  CHEST - 2 VIEW  Comparison:  01/25/13  Findings:  The heart size and mediastinal contours are within normal limits.  Both lungs are clear.  The visualized skeletal structures are unremarkable.  IMPRESSION: No active cardiopulmonary disease.   Original Report Authenticated By: Judie Petit. Miles Costain, M.D.      Assessment/Plan Principal Problem:   Alcohol withdrawal Active Problems:   Lymphoma, non-Hodgkin's   Alcohol abuse   Cigarette smoker   PE (pulmonary embolism)   Suicidal ideation   Leucopenia   1. Alcohol withdrawal - closely watch out for delirium tremens. Continue with alcohol withdrawal protocol with IV Ativan. Thiamine. 2. Suicidal ideation - patient is placed on suicidal precaution with sitter. Consult psychiatry in a.m. 3. Recently diagnosed pulmonary embolism -  Continue xarelto. 4. Nausea vomiting - patient's abdomen appears benign on exam. Check LFTs and lipase. 5. Non-Hodgkin's lymphoma in remission - further recommendations per oncology. 6. Leukopenia - recent CBC with differential showed absolute neutrophil count more than 500. Recheck CBC with differential. Most likely leukopenia could be from alcoholism.    Code Status: Full code.  Family Communication: None.  Disposition Plan: Admit to inpatient.    Saagar Tortorella N. [facility] Pager [phone].  If 7PM-7AM, please contact night-coverage [email or web address] Password [facility] 01/31/2013, 6:05 AM

## 2013-01-31 NOTE — Progress Notes (Signed)
Decline admission to [facility] due to marked medical acuity to include acute alcoholic live d/z, neutropenia in setting of pulmonary embolus and non hodgkins lymphoma. Donell Sievert, [facility] from [address] recommend 5 east ,medical for placement and needed psychiatric intervention.  Assessor collaborated with Dr. Maren Reamer regarding the recommendations via phone.  At 0450, Dr. Maren Reamer discussed with the Assessor that he would try and get him medically admitted.

## 2013-01-31 NOTE — Progress Notes (Signed)
INITIAL NUTRITION ASSESSMENT  DOCUMENTATION CODES Per approved criteria  -Not Applicable   INTERVENTION: Encourage PO intake >75% of meals Continue Multivitamin with mineral, Folvite, and thiamine supplements daily  NUTRITION DIAGNOSIS: Inadequate oral intake related to lack of desire to eat as evidenced by 15 lb wt loss (8% wt loss) in 1 month.   Goal: Pt to consume >75% of meals Wt maintenance  Monitor:  PO intake Wt  Reason for Assessment: MST  52 y.o. male  Admitting Dx: Alcohol withdrawal  ASSESSMENT: 52 y.o. male with history of non-Hodgkin's lymphoma in remission and recently diagnosed pulmonary embolism on xarelto presented to the ER for alcohol detox. Patient had initially 2-3 episodes of nausea and vomiting but denies any abdominal pain chest pain or shortness of breath. Patient admitted for alcohol detox with close monitoring for delirium.  Pt reports that he had only been drinking water and alcohol for the past 3 days and has had a decreased appetite for the past month; pt had no desire to eat. Pt reports his usual body weight is 170 lbs but, wt history shows pt usually weighs around 184 lbs. Pt states he wants to lose weight but, feels he his losing muscle mass too. Told pt he is at a healthy weight and 15 lb wt loss in 1 month is unhealthy rate of wt loss. Encouraged pt to eat 3 balanced meals daily with some protein-rich food at each meal. Pt reports having a normal appetite today and eating 100% of meals.   Height: Ht Readings from Last 1 Encounters:  01/31/13 5\' 10"  (1.778 m)    Weight: Wt Readings from Last 1 Encounters:  01/31/13 169 lb 12.1 oz (77 kg)    Ideal Body Weight: 166 lbs  % Ideal Body Weight: 102%  Wt Readings from Last 10 Encounters:  01/31/13 169 lb 12.1 oz (77 kg)  01/08/13 184 lb 3.2 oz (83.553 kg)  12/31/12 184 lb 15.5 oz (83.9 kg)  09/22/12 182 lb (82.555 kg)  09/08/12 178 lb (80.74 kg)  08/24/12 184 lb 8 oz (83.689 kg)   07/04/12 180 lb 3.2 oz (81.738 kg)  03/08/12 186 lb (84.369 kg)  01/05/12 191 lb 8 oz (86.864 kg)  12/01/11 187 lb 11.2 oz (85.14 kg)    Usual Body Weight: 170 lb per pt  % Usual Body Weight: 99%  BMI:  Body mass index is 24.36 kg/(m^2).  Estimated Nutritional Needs: Kcal: 1925-2310 Protein: 77-93 grams Fluid: 2.3 L  Skin: WDL  Diet Order: Cardiac  EDUCATION NEEDS: -No education needs identified at this time   Intake/Output Summary (Last 24 hours) at 01/31/13 1020 Last data filed at 01/31/13 0803  Gross per 24 hour  Intake    240 ml  Output      0 ml  Net    240 ml    Last BM: 3/26   Labs:   Recent Labs Lab 01/25/13 0740 01/30/13 1840 01/30/13 2022 01/31/13 0740  NA 135 139  --  131*  K 4.2 6.0* 4.6 4.3  CL 96 98  --  93*  CO2 26 27  --  29  BUN 19 21  --  18  CREATININE 0.85 1.08  --  0.87  CALCIUM 8.7 9.1  --  8.9  GLUCOSE 96 118*  --  104*    CBG (last 3)  No results found for this basename: GLUCAP,  in the last 72 hours  Scheduled Meds: . diazepam  5 mg Intravenous Q6H  .  folic acid  1 mg Oral Daily  . gabapentin  300 mg Oral TID  . LORazepam  0-4 mg Intravenous Q6H   Followed by  . [START ON 02/02/2013] LORazepam  0-4 mg Intravenous Q12H  . multivitamin with minerals  1 tablet Oral Daily  . pantoprazole  40 mg Oral QHS  . Rivaroxaban  20 mg Oral Daily  . sodium chloride  3 mL Intravenous Q12H  . thiamine  100 mg Oral Daily   Or  . thiamine  100 mg Intravenous Daily    Continuous Infusions: . sodium chloride 125 mL/hr at 01/31/13 0865    Past Medical History  Diagnosis Date  . ED (erectile dysfunction)   . Cancer 07/2011    STAGE II NON- HODGKIN LYMPHOMA  . Lymphoma, non Hodgkin's 09/25/2011  . Pulmonary emboli   . Depression     Past Surgical History  Procedure Laterality Date  . No past surgeries      Ian Malkin RD, LDN Inpatient Clinical Dietitian Pager: [phone] After Hours Pager: [phone]

## 2013-01-31 NOTE — Progress Notes (Signed)
Clinical Social Work Department CLINICAL SOCIAL WORK PSYCHIATRY SERVICE LINE ASSESSMENT 01/31/2013  Patient:  Leonard Bentley  Account:  [id number]  Admit Date:  01/30/2013  Clinical Social Worker:  Unk Lightning, LCSW  Date/Time:  01/31/2013 03:45 PM Referred by:  Physician  Date referred:  01/31/2013 Reason for Referral  Substance Abuse   Presenting Symptoms/Problems (In the person's/family's own words):   Psych consulted due to SI and alcohol abuse   Abuse/Neglect/Trauma History (check all that apply)  Denies history   Abuse/Neglect/Trauma Comments:   Psychiatric History (check all that apply)  Outpatient treatment   Psychiatric medications:  Ativan   Current Mental Health Hospitalizations/Previous Mental Health History:   Patient denies any previous diagnosis. Patient reports feels anxious and depressed after being diagnosed with cancer. Patient reports that he was prescribed Ativan in March 2013 and took pills occassionally before scans and tests.   Current provider:   None   Place and Date:   N/A   Current Medications:   acetaminophen, acetaminophen, LORazepam, LORazepam, ondansetron (ZOFRAN) IV, ondansetron            . diazepam  5 mg Intravenous Q6H  . folic acid  1 mg Oral Daily  . gabapentin  300 mg Oral TID  . LORazepam  0-4 mg Intravenous Q6H   Followed by     . [START ON 02/02/2013] LORazepam  0-4 mg Intravenous Q12H  . multivitamin with minerals  1 tablet Oral Daily  . pantoprazole  40 mg Oral QHS  . Rivaroxaban  20 mg Oral Daily  . sodium chloride  3 mL Intravenous Q12H  . thiamine  100 mg Oral Daily   Or     . thiamine  100 mg Intravenous Daily   Previous Impatient Admission/Date/Reason:   None reported   Emotional Health / Current Symptoms    Suicide/Self Harm  None reported   Suicide attempt in the past:   Patient denies any SI or HI at this time. Patient reports no previous suicide attempts. Patient denied ever SI during hospitalization.   Other  harmful behavior:   None reported   Psychotic/Dissociative Symptoms  None reported   Other Psychotic/Dissociative Symptoms:    Attention/Behavioral Symptoms  Withdrawn   Other Attention / Behavioral Symptoms:   Patient maintained good eye contact and was engaged throughout the entire assessment.    Cognitive Impairment  Within Normal Limits   Other Cognitive Impairment:    Mood and Adjustment  Flat    Stress, Anxiety, Trauma, Any Recent Loss/Stressor  None reported   Anxiety (frequency):   Phobia (specify):   Compulsive behavior (specify):   Obsessive behavior (specify):   Other:   Substance Abuse/Use  Current substance use  Substance abuse treatment needed   SBIRT completed (please refer for detailed history):  Y  Self-reported substance use:   Patient reports he drinks on a daily basis. Patient consumes between half of a fifth to a fifth of liquor daily. Patient has been drinking heavily for the past two years but started drinking around the age of 68 originally. Patient denies any other substance use.   Urinary Drug Screen Completed:  Y Alcohol level:   57    Environmental/Housing/Living Arrangement  Stable housing   Who is in the home:   Alone   Emergency contact:  Engineer, water   Patient's Strengths and Goals (patient's own words):   Patient admitted for detox and desires treatment.   Clinical Social Worker's  Interpretive Summary:   CSW received referral due to patient admitted for detox and for SI. CSW reviewed chart and met with patient at bedside. Patient agreeable to assessment. CSW introduced myself and explained role.    Patient reports he was admitted to the hospital after two friends encouraged him to come and get detox from alcohol. Patient reports that he started drinking around age 38 but started drinking heavily about 2 years ago. Patient reports he was diagnosed with cancer and is currently in remission.  Patient reports increased stress after chemo and getting bills from the hospital.    Patient has been sober for a few days at a time but never for extended amounts of time. Patient reports that he came to [facility] about 6 months ago and detoxed but did not follow through with treatment after detox. Patient has been to [facility] but not on a consistent basis.    Patient is interested in treatment. CSW and patient discussed treatment options. CSW explained outpatient therapy, intensive outpatient therapy and inpatient treatment. Patient reports that due to work he cannot complete an inpatient program. Patient is interested in intensive outpatient but only if there is an evening program. CSW called agencies and left messages to determine who could accept his insurance for night groups. CSW will provide patient with AA meetings listing as well.    CSW and patient discussed if patient's alcohol use was related to depression or anxiety. Patient reports an increase in depression lately. Patient identified symptoms such as decreased sleep and food intake. Patient reports no interest in activities he once enjoyed such as writing music or martial arts. Patient also reports increased fear of dying. Patient reports he wants to talk with psych MD regarding medication options for depression.    Patient engaged throughout assessment and motivated for treatment. Patient able to identify triggers and motivation for remaining sober. Patient able to identify positive informal supports via friends but a possible barrier is that dtr is moving to [place] soon. Patient wants to maintain employment and follow through with treatment in the evenings. Patient does not appear to be a harm to himself or others at this time. Although inpatient treatment would be beneficial for patient, patient cannot afford this option at this time. Patient agreeable to CSW follow up.    CSW will staff case with psych MD and will provide  outpatient resources for patient.   Disposition:  Outpatient referral made/needed

## 2013-01-31 NOTE — Treatment Plan (Cosign Needed)
Decline admission to [facility] due to marked medical acuity to include acute alcoholic live d/z, neutropenia in setting of pulmonary embolus and non hodgkins lymphoma. Recommend [facility] east ,medical for placement and needed psychiatric intervention.

## 2013-01-31 NOTE — Consult Note (Signed)
Reason for Consult: depression, suicidal thoughts and alcohol intoxication Referring Physician: Dr. Waneta Martins Leonard Bentley is an 52 y.o. male.  HPI: Patient was seen and chart reviewed. Patient reported he was brought in by his best friend when he suffering with themultiple medical problems and alcohol dependence. Patient reported he has been drinking over 20 years. He drinks gin 150 during weekdays and more than one fifth of wine on weekends. Patient reportedly lives by himself and has no significant others. Patient reported he has Leonard brother who lives in [place] and able to spend some time if needed. Patient reportedly works for [employer] over 24 years and is proud of his work. Patient has been getting better, without nausea and vomiting. He continued to have fatigue and generalized weakness. He denied symptoms of depression, anxiety and other drug of abuse other than alcohol. Patient was diagnosed with non-Hodgkin's lymphoma about 2 years ago, received chemotherapy and currently in remission. He had one episode of pulmonary embolism.  MSE: Patient mood is fine and affect is appropriate. He has normal speech and thoughts. He denied SI/HI and no evidence of psychosis.   Past Medical History  Diagnosis Date  . ED (erectile dysfunction)   . Cancer 07/2011    STAGE II NON- HODGKIN LYMPHOMA  . Lymphoma, non Hodgkin's 09/25/2011  . Pulmonary emboli   . Depression     Past Surgical History  Procedure Laterality Date  . No past surgeries      Family History  Problem Relation Age of Onset  . Cancer Mother     BREAST(BONE)  . Cancer Father     PANCREATIC    Social History:  reports that he has been smoking Cigarettes.  He has Leonard 32 pack-year smoking history. He has never used smokeless tobacco. He reports that  drinks alcohol. He reports that he does not use illicit drugs.  Allergies: No Known Allergies  Medications: I have reviewed the patient's current  medications.  Results for orders placed during the hospital encounter of 01/30/13 (from the past 48 hour(s))  ACETAMINOPHEN LEVEL     Status: None   Collection Time    01/30/13  6:40 PM      Result Value Range   Acetaminophen (Tylenol), Serum <15.0  10 - 30 ug/mL   Comment:            THERAPEUTIC CONCENTRATIONS VARY     SIGNIFICANTLY. Leonard RANGE OF 10-30     ug/mL MAY BE AN EFFECTIVE     CONCENTRATION FOR MANY PATIENTS.     HOWEVER, SOME ARE BEST TREATED     AT CONCENTRATIONS OUTSIDE THIS     RANGE.     ACETAMINOPHEN CONCENTRATIONS     >150 ug/mL AT 4 HOURS AFTER     INGESTION AND >50 ug/mL AT 12     HOURS AFTER INGESTION ARE     OFTEN ASSOCIATED WITH TOXIC     REACTIONS.  CBC     Status: Abnormal   Collection Time    01/30/13  6:40 PM      Result Value Range   WBC 2.0 (*) 4.0 - 10.5 K/uL   RBC 5.31  4.22 - 5.81 MIL/uL   Hemoglobin 13.1  13.0 - 17.0 g/dL   HCT 16.1  09.6 - 04.5 %   MCV 77.8 (*) 78.0 - 100.0 fL   MCH 24.7 (*) 26.0 - 34.0 pg   MCHC 31.7  30.0 - 36.0 g/dL  RDW 15.2  11.5 - 15.5 %   Platelets 166  150 - 400 K/uL  COMPREHENSIVE METABOLIC PANEL     Status: Abnormal   Collection Time    01/30/13  6:40 PM      Result Value Range   Sodium 139  135 - 145 mEq/L   Potassium 6.0 (*) 3.5 - 5.1 mEq/L   Chloride 98  96 - 112 mEq/L   CO2 27  19 - 32 mEq/L   Glucose, Bld 118 (*) 70 - 99 mg/dL   BUN 21  6 - 23 mg/dL   Creatinine, Ser 1.61  0.50 - 1.35 mg/dL   Calcium 9.1  8.4 - 09.6 mg/dL   Total Protein 7.6  6.0 - 8.3 g/dL   Albumin 3.7  3.5 - 5.2 g/dL   AST 045 (*) 0 - 37 U/L   ALT 248 (*) 0 - 53 U/L   Alkaline Phosphatase 80  39 - 117 U/L   Total Bilirubin 1.1  0.3 - 1.2 mg/dL   GFR calc non Af Amer 77 (*) >90 mL/min   GFR calc Af Amer 89 (*) >90 mL/min   Comment:            The eGFR has been calculated     using the CKD EPI equation.     This calculation has not been     validated in all clinical     situations.     eGFR's persistently     <90 mL/min  signify     possible Chronic Kidney Disease.  ETHANOL     Status: Abnormal   Collection Time    01/30/13  6:40 PM      Result Value Range   Alcohol, Ethyl (B) 191 (*) 0 - 11 mg/dL   Comment:            LOWEST DETECTABLE LIMIT FOR     SERUM ALCOHOL IS 11 mg/dL     FOR MEDICAL PURPOSES ONLY  SALICYLATE LEVEL     Status: Abnormal   Collection Time    01/30/13  6:40 PM      Result Value Range   Salicylate Lvl <2.0 (*) 2.8 - 20.0 mg/dL  URINE RAPID DRUG SCREEN (HOSP PERFORMED)     Status: None   Collection Time    01/30/13  7:16 PM      Result Value Range   Opiates NONE DETECTED  NONE DETECTED   Cocaine NONE DETECTED  NONE DETECTED   Benzodiazepines NONE DETECTED  NONE DETECTED   Amphetamines NONE DETECTED  NONE DETECTED   Tetrahydrocannabinol NONE DETECTED  NONE DETECTED   Barbiturates NONE DETECTED  NONE DETECTED   Comment:            DRUG SCREEN FOR MEDICAL PURPOSES     ONLY.  IF CONFIRMATION IS NEEDED     FOR ANY PURPOSE, NOTIFY LAB     WITHIN 5 DAYS.                LOWEST DETECTABLE LIMITS     FOR URINE DRUG SCREEN     Drug Class       Cutoff (ng/mL)     Amphetamine      1000     Barbiturate      200     Benzodiazepine   200     Tricyclics       300     Opiates  300     Cocaine          300     THC              50  POTASSIUM     Status: None   Collection Time    01/30/13  8:22 PM      Result Value Range   Potassium 4.6  3.5 - 5.1 mEq/L   Comment: DELTA CHECK NOTED  ETHANOL     Status: Abnormal   Collection Time    01/31/13  1:10 AM      Result Value Range   Alcohol, Ethyl (B) 57 (*) 0 - 11 mg/dL   Comment:            LOWEST DETECTABLE LIMIT FOR     SERUM ALCOHOL IS 11 mg/dL     FOR MEDICAL PURPOSES ONLY  BASIC METABOLIC PANEL     Status: Abnormal   Collection Time    01/31/13  7:40 AM      Result Value Range   Sodium 131 (*) 135 - 145 mEq/L   Comment: DELTA CHECK NOTED     REPEATED TO VERIFY   Potassium 4.3  3.5 - 5.1 mEq/L   Chloride 93 (*) 96  - 112 mEq/L   CO2 29  19 - 32 mEq/L   Glucose, Bld 104 (*) 70 - 99 mg/dL   BUN 18  6 - 23 mg/dL   Creatinine, Ser 9.60  0.50 - 1.35 mg/dL   Calcium 8.9  8.4 - 45.4 mg/dL   GFR calc non Af Amer >90  >90 mL/min   GFR calc Af Amer >90  >90 mL/min   Comment:            The eGFR has been calculated     using the CKD EPI equation.     This calculation has not been     validated in all clinical     situations.     eGFR's persistently     <90 mL/min signify     possible Chronic Kidney Disease.  CBC WITH DIFFERENTIAL     Status: Abnormal   Collection Time    01/31/13  7:40 AM      Result Value Range   WBC 2.7 (*) 4.0 - 10.5 K/uL   RBC 4.45  4.22 - 5.81 MIL/uL   Hemoglobin 11.5 (*) 13.0 - 17.0 g/dL   HCT 09.8 (*) 11.9 - 14.7 %   MCV 77.8 (*) 78.0 - 100.0 fL   MCH 25.8 (*) 26.0 - 34.0 pg   MCHC 33.2  30.0 - 36.0 g/dL   RDW 82.9  56.2 - 13.0 %   Platelets 136 (*) 150 - 400 K/uL   Neutrophils Relative 56  43 - 77 %   Neutro Abs 1.5 (*) 1.7 - 7.7 K/uL   Lymphocytes Relative 26  12 - 46 %   Lymphs Abs 0.7  0.7 - 4.0 K/uL   Monocytes Relative 16 (*) 3 - 12 %   Monocytes Absolute 0.4  0.1 - 1.0 K/uL   Eosinophils Relative 2  0 - 5 %   Eosinophils Absolute 0.1  0.0 - 0.7 K/uL   Basophils Relative 0  0 - 1 %   Basophils Absolute 0.0  0.0 - 0.1 K/uL  LIPASE, BLOOD     Status: Abnormal   Collection Time    01/31/13  7:40 AM      Result Value Range   Lipase  131 (*) 11 - 59 U/L    Dg Chest 2 View  01/30/2013  *RADIOLOGY REPORT*  Clinical Data: shortness of breath  CHEST - 2 VIEW  Comparison:  01/25/13  Findings:  The heart size and mediastinal contours are within normal limits.  Both lungs are clear.  The visualized skeletal structures are unremarkable.  IMPRESSION: No active cardiopulmonary disease.   Original Report Authenticated By: Judie Petit. Miles Costain, M.D.     Positive for anxiety, bad mood and excessive alcohol consumption Blood pressure 120/85, pulse 110, temperature 98.2 F (36.8 C),  temperature source Oral, resp. rate 19, height 5\' 10"  (1.778 m), weight 169 lb 12.1 oz (77 kg), SpO2 100.00%.   Assessment/Plan: Substance induced mood disorder Alcohol dependence and withdrawal   Recommendations: Recommended intensive outpatient substance abuse treatment at [facility]. Patient does not need inpatient hospitalization as he has completing alcohol detox treatment in the medical floor. Patient agreed with this plan. Social worker will be requested to make appropriate and admits at the time of discharge. Appreciated Psychiatric consultation on this patient. Will sign off on him and may contact if he needs further assistance.  Ithan Touhey,JANARDHAHA R. 01/31/2013, 2:28 PM

## 2013-01-31 NOTE — ED Notes (Signed)
Sitter now available to transport patient upstairs.

## 2013-01-31 NOTE — ED Notes (Signed)
Maralyn Sago, [facility] contacted and informed that patient needs a sitter due to SI. AC reported to writer that she would not have a sitter until 7:00 am.

## 2013-02-01 DIAGNOSIS — D649 Anemia, unspecified: Secondary | ICD-10-CM

## 2013-02-01 LAB — COMPREHENSIVE METABOLIC PANEL
ALT: 164 U/L — ABNORMAL HIGH (ref 0–53)
AST: 253 U/L — ABNORMAL HIGH (ref 0–37)
Albumin: 2.9 g/dL — ABNORMAL LOW (ref 3.5–5.2)
Alkaline Phosphatase: 55 U/L (ref 39–117)
BUN: 12 mg/dL (ref 6–23)
CO2: 28 mEq/L (ref 19–32)
Calcium: 8.6 mg/dL (ref 8.4–10.5)
Chloride: 100 mEq/L (ref 96–112)
Creatinine, Ser: 0.82 mg/dL (ref 0.50–1.35)
GFR calc Af Amer: >90 mL/min (ref >90)
GFR calc non Af Amer: >90 mL/min (ref >90)
Glucose, Bld: 103 mg/dL — ABNORMAL HIGH (ref 70–99)
Potassium: 4.5 mEq/L (ref 3.5–5.1)
Sodium: 136 mEq/L (ref 135–145)
Total Bilirubin: 1.4 mg/dL — ABNORMAL HIGH (ref 0.3–1.2)
Total Protein: 5.5 g/dL — ABNORMAL LOW (ref 6.0–8.3)

## 2013-02-01 LAB — CBC
HCT: 29.9 % — ABNORMAL LOW (ref 39.0–52.0)
Hemoglobin: 10 g/dL — ABNORMAL LOW (ref 13.0–17.0)
MCH: 26.6 pg (ref 26.0–34.0)
MCHC: 33.4 g/dL (ref 30.0–36.0)
MCV: 79.5 fL (ref 78.0–100.0)
Platelets: 93 10*3/uL — ABNORMAL LOW (ref 150–400)
RBC: 3.76 MIL/uL — ABNORMAL LOW (ref 4.22–5.81)
RDW: 14.9 % (ref 11.5–15.5)
WBC: 2 10*3/uL — ABNORMAL LOW (ref 4.0–10.5)

## 2013-02-01 MED ORDER — NICOTINE 21 MG/24HR TD PT24
21.0000 mg | MEDICATED_PATCH | Freq: Every day | TRANSDERMAL | Status: DC
  Administered 2013-02-01 – 2013-02-02 (×2): 21 mg via TRANSDERMAL
  Filled 2013-02-01 (×2): qty 1

## 2013-02-01 NOTE — Progress Notes (Signed)
[facility] PROGRESS NOTE  Leonard Bentley WUJ:[id number] DOB: 1960/11/13 DOA: 01/30/2013 PCP: Carollee Herter, MD  Brief narrative:  52 year old male with history of non-Hodgkin's lymphoma she is in remission with a recently diagnosed PE and being treated on xarelto presented to the ED requesting detox for alcohol. he also had suicidal ideations on Presentation.    Assessment/Plan:  Acute alcohol withdrawal  Patient showing early signs of withdrawal. Continue CIWA. Continue  on scheduled Valium. Continue thiamine, folate and multivitamin. Has high risk for going  into DTs. We'll closely monitor on telemetry. SW  provided patient with list of AA meetings and patient reports he will follow up with meetings after being dced..  depression  Patient denies any suicidal thoughts at the present. Marland Kitchenpsychiatry consult called.   Recently diagnosed PE  Continue on xarelto . Change dose to 20 mg once a [place]ger. Given history of heavy alcohol abuse he is at a high risk for fall and injury with bleeding. I haven't discussed about this in detail with him. He informs that there have been instances where he almost fell and hit his head.   Nausea and vomiting  Possibly in the setting of alcohol withdrawal. Noted for transaminitis on labs. Exam is benign. Asymptomatic at present. LFTs trending down.   pancytopenia  Likely in the setting of alcohol use. Continue to monitor   Hyperkalemia  Present on admission and now resolved.   History of non-Hodgkin's lymphoma  In remission.  Follows with Dr. Arbutus Ped   Code Status: Full code  Family Communication: None at bedside  Disposition Plan: home once stable Consultants:  Psychiatry Procedures:  none Antibiotics:  None HPI/Subjective:  Patient denies any suicidal ideations however feels a depressed. He informs drinking 2/5-3/5 of liquor every Vizzini. Denies any withdrawal symptoms   Objective: Filed Vitals:   01/31/13 2000 01/31/13 2100 01/31/13 2300  02/01/13 0235  BP: 113/78  115/86 113/80  Pulse: 77 94 73 82  Temp: 98 F (36.7 C)  97.7 F (36.5 C) 98.4 F (36.9 C)  TempSrc: Oral  Oral Oral  Resp: 16  16 20   Height:      Weight:      SpO2: 100%   100%    Intake/Output Summary (Last 24 hours) at 02/01/13 1238 Last data filed at 02/01/13 0900  Gross per 24 hour  Intake 2130.42 ml  Output      0 ml  Net 2130.42 ml   Filed Weights   01/31/13 0604  Weight: 77 kg (169 lb 12.1 oz)    Exam:  General: Middle aged male appears restless in bed  Cardiovascular: S1&S2 tachycardic, no murmurs, rubs or gallop  Respiratory: Clear to auscultation bilaterally, no added sounds  Abdomen: Soft, nontender, nondistended, bowel sounds present  Musculoskeletal: Warm, no edema  CNS: AAO x3, fine tremors  Data Reviewed: Basic Metabolic Panel:  Recent Labs Lab 01/30/13 1840 01/30/13 2022 01/31/13 0740 02/01/13 0512  NA 139  --  131* 136  K 6.0* 4.6 4.3 4.5  CL 98  --  93* 100  CO2 27  --  29 28  GLUCOSE 118*  --  104* 103*  BUN 21  --  18 12  CREATININE 1.08  --  0.87 0.82  CALCIUM 9.1  --  8.9 8.6   Liver Function Tests:  Recent Labs Lab 01/30/13 1840 02/01/13 0512  AST 452* 253*  ALT 248* 164*  ALKPHOS 80 55  BILITOT 1.1 1.4*  PROT 7.6 5.5*  ALBUMIN  3.7 2.9*    Recent Labs Lab 01/31/13 0740  LIPASE 131*   No results found for this basename: AMMONIA,  in the last 168 hours CBC:  Recent Labs Lab 01/30/13 1840 01/31/13 0740 02/01/13 0512  WBC 2.0* 2.7* 2.0*  NEUTROABS  --  1.5*  --   HGB 13.1 11.5* 10.0*  HCT 41.3 34.6* 29.9*  MCV 77.8* 77.8* 79.5  PLT 166 136* 93*   Cardiac Enzymes: No results found for this basename: CKTOTAL, CKMB, CKMBINDEX, TROPONINI,  in the last 168 hours BNP (last 3 results) No results found for this basename: PROBNP,  in the last 8760 hours CBG: No results found for this basename: GLUCAP,  in the last 168 hours  No results found for this or any previous visit (from the  past 240 hour(s)).   Studies: Dg Chest 2 View  01/30/2013  *RADIOLOGY REPORT*  Clinical Data: shortness of breath  CHEST - 2 VIEW  Comparison:  01/25/13  Findings:  The heart size and mediastinal contours are within normal limits.  Both lungs are clear.  The visualized skeletal structures are unremarkable.  IMPRESSION: No active cardiopulmonary disease.   Original Report Authenticated By: Judie Petit. Shick, M.D.     Scheduled Meds: . diazepam  5 mg Intravenous Q6H  . folic acid  1 mg Oral Daily  . gabapentin  300 mg Oral TID  . LORazepam  0-4 mg Intravenous Q6H   Followed by  . [START ON 02/02/2013] LORazepam  0-4 mg Intravenous Q12H  . multivitamin with minerals  1 tablet Oral Daily  . nicotine  21 mg Transdermal Daily  . pantoprazole  40 mg Oral QHS  . Rivaroxaban  20 mg Oral Daily  . sodium chloride  3 mL Intravenous Q12H  . thiamine  100 mg Oral Daily   Or  . thiamine  100 mg Intravenous Daily   Continuous Infusions:     Time spent: 25 minutes    Leonard Bentley  [facility] Pager [phone]. If 7PM-7AM, please contact night-coverage at [email or web address], password [facility] 02/01/2013, 12:38 PM  LOS: 2 days

## 2013-02-01 NOTE — Progress Notes (Signed)
Clinical Social Work Progress Note PSYCHIATRY SERVICE LINE 02/01/2013  Patient:  Leonard Bentley  Account:  [id number]  Admit Date:  01/30/2013  Clinical Social Worker:  Unk Lightning, LCSW  Date/Time:  02/01/2013 11:30 AM  Review of Patient  Overall Medical Condition:   Patient reports he is feeling better today.   Participation Level:  Active  Participation Quality  Appropriate   Other Participation Quality:   Affect  Appropriate   Cognitive  Appropriate   Reaction to Medications/Concerns:   None reported   Modes of Intervention  Support  Problem-solving   Summary of Progress/Plan at Discharge   CSW met with patient at bedside in order to provide support and evaluate patient's mood today. Patient laying in bed when CSW arrived but agreeable to session.    Patient reports that he is feeling better today and has been thinking about plans. Patient reports no depression today and denies any SI or HI. Patient reports he feels scared and anxious about plans to remain sober. CSW and patient discussed available options and patient continues to be interested in intensive outpatient therapy. CSW spoke with [facility] who accepts patient's insurance and provides IOP on M, W, F from 6:30pm-9pm. Patient reports that agency is close to his home and feels it would be best for treatment. CSW also provided patient with list of AA meetings and patient reports he will follow up with meetings at dc.    CSW and patient discussed finding a routine and following through with treatment. Patient feels confident that with working, going to IOP and AA meetings that he can remain sober. CSW placed information for [facility] on AVS and will continue to follow.

## 2013-02-02 DIAGNOSIS — D61818 Other pancytopenia: Secondary | ICD-10-CM | POA: Diagnosis present

## 2013-02-02 LAB — CBC WITH DIFFERENTIAL/PLATELET
Basophils Absolute: 0 10*3/uL (ref 0.0–0.1)
Basophils Relative: 1 % (ref 0–1)
Eosinophils Absolute: 0.1 10*3/uL (ref 0.0–0.7)
Eosinophils Relative: 4 % (ref 0–5)
HCT: 31.1 % — ABNORMAL LOW (ref 39.0–52.0)
Hemoglobin: 10.1 g/dL — ABNORMAL LOW (ref 13.0–17.0)
Lymphocytes Relative: 21 % (ref 12–46)
Lymphs Abs: 0.5 10*3/uL — ABNORMAL LOW (ref 0.7–4.0)
MCH: 26.1 pg (ref 26.0–34.0)
MCHC: 32.5 g/dL (ref 30.0–36.0)
MCV: 80.4 fL (ref 78.0–100.0)
Monocytes Absolute: 0.5 10*3/uL (ref 0.1–1.0)
Monocytes Relative: 21 % — ABNORMAL HIGH (ref 3–12)
Neutro Abs: 1.3 10*3/uL — ABNORMAL LOW (ref 1.7–7.7)
Neutrophils Relative %: 53 % (ref 43–77)
Platelets: 110 10*3/uL — ABNORMAL LOW (ref 150–400)
RBC: 3.87 MIL/uL — ABNORMAL LOW (ref 4.22–5.81)
RDW: 15.1 % (ref 11.5–15.5)
WBC: 2.4 10*3/uL — ABNORMAL LOW (ref 4.0–10.5)

## 2013-02-02 LAB — HEPATIC FUNCTION PANEL
ALT: 134 U/L — ABNORMAL HIGH (ref 0–53)
AST: 162 U/L — ABNORMAL HIGH (ref 0–37)
Albumin: 2.8 g/dL — ABNORMAL LOW (ref 3.5–5.2)
Alkaline Phosphatase: 51 U/L (ref 39–117)
Bilirubin, Direct: 0.3 mg/dL (ref 0.0–0.3)
Indirect Bilirubin: 0.6 mg/dL (ref 0.3–0.9)
Total Bilirubin: 0.9 mg/dL (ref 0.3–1.2)
Total Protein: 5.5 g/dL — ABNORMAL LOW (ref 6.0–8.3)

## 2013-02-02 MED ORDER — RIVAROXABAN 20 MG PO TABS: 20.0000 mg | ORAL_TABLET | Freq: Every day | ORAL | Status: DC

## 2013-02-02 MED ORDER — NICOTINE 21 MG/24HR TD PT24: 1.0000 | MEDICATED_PATCH | Freq: Every day | TRANSDERMAL | Status: DC

## 2013-02-02 MED ORDER — LORAZEPAM 1 MG PO TABS: 1.0000 mg | ORAL_TABLET | Freq: Four times a day (QID) | ORAL | Status: DC | PRN

## 2013-02-02 MED ORDER — ADULT MULTIVITAMIN W/MINERALS CH: 1.0000 | ORAL_TABLET | Freq: Every day | ORAL | Status: DC

## 2013-02-02 NOTE — Progress Notes (Signed)
Clinical Social Work  Per chart review, patient to [employer] home today. Patient has resources for outpatient SA treatment options. CSW is signing off but available if further needs arise.  [place], [place] [phone]

## 2013-02-02 NOTE — Progress Notes (Signed)
Patient discharged home, discharge instructions given and explained to patient and he verbalized understanding, denies any pain/distress. Transported to the car be staff via wheelchair. Skin intact, no wound. Transported home via taxi.

## 2013-02-02 NOTE — Discharge Summary (Addendum)
Physician Discharge Summary  Leonard Bentley ZOX:[id number] DOB: October 11, 1961 DOA: 01/30/2013  PCP: Carollee Herter, MD  Admit date: 01/30/2013 Discharge date: 02/02/2013  Time spent: 40 minutes  Recommendations for Outpatient Follow-up:  Home with outpatient PCP follow up. Needs CBC checked in 1 week for pancytopenia. Referral for outpatient AA programs   Discharge Diagnoses:  Principal Problem:   Alcohol withdrawal symptoms  Active Problems:   Alcohol abuse   Cigarette smoker   Lymphoma, non-Hodgkin's   PE (pulmonary embolism)   Suicidal ideation   Leucopenia   Other pancytopenia   transaminitis   hyperkalemia   Discharge Condition: fair  Diet recommendation: *regular  Filed Weights   01/31/13 0604  Weight: 77 kg (169 lb 12.1 oz)    History of present illness:  Please refer to admission H&P for details , but in brief, 52 year old male with history of non-Hodgkin's lymphoma she is in remission with a recently diagnosed PE and being treated on xarelto presented to the ED requesting detox for alcohol. he also had suicidal ideations on Presentation.    Hospital Course:  Acute alcohol withdrawal  Patient presented with early signs of withdrawal. Placed on CIWA. Continued on scheduled Valium. Continue thiamine, folate and multivitamin. No signs of DTs although has mild tremors. Stable on telemetry. SW provided patient with list of AA meetings and patient reports he will follow up with meetings after being dced.Marland Kitchen  He is clinically stable an can be discharged home with outpt PCP follow up. i will give him a prescription for ativan for withdrawal symptoms. He promises not to drink again.   ?depression  Patient had some suicidal thoughts on presentation . Patient denied further  suicidal thoughts or depressive symptoms.  Recently diagnosed PE  Continue on xarelto . Change dose to 20 mg once a [place]. Given history of heavy alcohol abuse he is at a high risk for fall and injury  with bleeding    Nausea and vomiting  Possibly in the setting of alcohol withdrawal. Noted for transaminitis on labs. Exam is benign. Asymptomatic at present. LFTs trending down. Recommend outpatient follow up.   pancytopenia  Likely in the setting of alcohol use. Slowly improved. Needs outpatient monitoring  Hyperkalemia  Present on admission and now resolved.   History of non-Hodgkin's lymphoma  In remission. Follows with Dr. Arbutus Ped   Tobacco abuse  counseled on cessation. Will prescribe nicotine patch   Procedures:  none  Consultations:  none  Discharge Exam: Filed Vitals:   02/01/13 1300 02/01/13 2055 02/02/13 0628 02/02/13 0855  BP: 110/75 115/80 119/89 119/79  Pulse: 68 70 77 112  Temp: 98.7 F (37.1 C) 98 F (36.7 C) 98.4 F (36.9 C) 97.3 F (36.3 C)  TempSrc: Oral Oral Oral Oral  Resp: 17 16 16 16   Height:      Weight:      SpO2: 100% 100% 100% 100%   General: Middle aged male in no acute distress Cardiovascular: normal S1&S2 , no murmurs, rubs or gallop  Respiratory: Clear to auscultation bilaterally, no added sounds  Abdomen: Soft, nontender, nondistended, bowel sounds present  Musculoskeletal: Warm, no edema CNS: AAO x3, fine tremors   Discharge Instructions   Future Appointments Provider Department Dept Phone   07/11/2013 8:00 AM [facility] Only [facility] [phone]   07/11/2013 9:00 AM [facility] 2 [facility] [phone]   Patient to arrive 15 minutes prior to appointment time. Patient to pick up oral contrast  at least 1 [place] prior to exam. No solid food 4 hours prior to exam. Liquids and Medicines are okay.   07/12/2013 9:15 AM Si Gaul, MD [facility] [phone]       Medication List    TAKE these medications       gabapentin 300 MG capsule  Commonly known as:  NEURONTIN  Take 300 mg by mouth 3 (three) times daily.     LORazepam 1 MG  tablet  Commonly known as:  ATIVAN  Take 1 tablet (1 mg total) by mouth every 6 (six) hours as needed for anxiety.     multivitamin with minerals Tabs  Take 1 tablet by mouth daily.     nicotine 21 mg/24hr patch  Commonly known as:  NICODERM CQ - dosed in mg/24 hours  Place 1 patch onto the skin daily.     Rivaroxaban 20 MG Tabs  Commonly known as:  XARELTO  Take 1 tablet (20 mg total) by mouth daily.     thiamine 100 MG tablet  Take 1 tablet (100 mg total) by mouth daily.           Follow-up Information   Call [facility]. (To schedule an appointment for intensive outpatient therapy (Meets Mon, Wed, Fri from 6:30pm-9:00pm))    Contact information:   [address] [place], [place] [address] [phone]      Follow up with Carollee Herter, MD In 1 week. (needs cbc checked for pancytopenia)    Contact information:   [address] [place] [address] [phone]        The results of significant diagnostics from this hospitalization (including imaging, microbiology, ancillary and laboratory) are listed below for reference.    Significant Diagnostic Studies: Dg Chest 2 View  01/30/2013  *RADIOLOGY REPORT*  Clinical Data: shortness of breath  CHEST - 2 VIEW  Comparison:  01/25/13  Findings:  The heart size and mediastinal contours are within normal limits.  Both lungs are clear.  The visualized skeletal structures are unremarkable.  IMPRESSION: No active cardiopulmonary disease.   Original Report Authenticated By: Judie Petit. Miles Costain, M.D.    Dg Chest 2 View  01/25/2013  *RADIOLOGY REPORT*  Clinical Data: Weak, short of breath  CHEST - 2 VIEW  Comparison: Prior chest x-ray 08/24/2012  Findings: The lungs are well-aerated and free from pulmonary edema, focal airspace consolidation or pulmonary nodule.  Cardiac and mediastinal contours are within normal limits.  No pneumothorax, or pleural effusion. No acute osseous findings.  IMPRESSION:  No acute cardiopulmonary  disease.   Original Report Authenticated By: Malachy Moan, M.D.     Microbiology: No results found for this or any previous visit (from the past 240 hour(s)).   Labs: Basic Metabolic Panel:  Recent Labs Lab 01/30/13 1840 01/30/13 2022 01/31/13 0740 02/01/13 0512  NA 139  --  131* 136  K 6.0* 4.6 4.3 4.5  CL 98  --  93* 100  CO2 27  --  29 28  GLUCOSE 118*  --  104* 103*  BUN 21  --  18 12  CREATININE 1.08  --  0.87 0.82  CALCIUM 9.1  --  8.9 8.6   Liver Function Tests:  Recent Labs Lab 01/30/13 1840 02/01/13 0512 02/02/13 0435  AST 452* 253* 162*  ALT 248* 164* 134*  ALKPHOS 80 55 51  BILITOT 1.1 1.4* 0.9  PROT 7.6 5.5* 5.5*  ALBUMIN 3.7 2.9* 2.8*    Recent  Labs Lab 01/31/13 0740  LIPASE 131*   No results found for this basename: AMMONIA,  in the last 168 hours CBC:  Recent Labs Lab 01/30/13 1840 01/31/13 0740 02/01/13 0512 02/02/13 0435  WBC 2.0* 2.7* 2.0* 2.4*  NEUTROABS  --  1.5*  --  1.3*  HGB 13.1 11.5* 10.0* 10.1*  HCT 41.3 34.6* 29.9* 31.1*  MCV 77.8* 77.8* 79.5 80.4  PLT 166 136* 93* 110*   Cardiac Enzymes: No results found for this basename: CKTOTAL, CKMB, CKMBINDEX, TROPONINI,  in the last 168 hours BNP: BNP (last 3 results) No results found for this basename: PROBNP,  in the last 8760 hours CBG: No results found for this basename: GLUCAP,  in the last 168 hours     Signed:  Eddie North  [facility] 02/02/2013, 9:38 AM

## 2013-02-12 ENCOUNTER — Inpatient Hospital Stay: Payer: Self-pay | Admitting: Family Medicine

## 2013-02-19 ENCOUNTER — Ambulatory Visit: Payer: Self-pay | Admitting: Family Medicine

## 2013-02-21 ENCOUNTER — Ambulatory Visit (INDEPENDENT_AMBULATORY_CARE_PROVIDER_SITE_OTHER): Payer: PRIVATE HEALTH INSURANCE | Admitting: Family Medicine

## 2013-02-21 ENCOUNTER — Encounter: Payer: Self-pay | Admitting: Family Medicine

## 2013-02-21 VITALS — BP 110/70 | HR 74 | Wt 180.0 lb

## 2013-02-21 DIAGNOSIS — F1010 Alcohol abuse, uncomplicated: Secondary | ICD-10-CM

## 2013-02-21 DIAGNOSIS — C8590 Non-Hodgkin lymphoma, unspecified, unspecified site: Secondary | ICD-10-CM

## 2013-02-21 DIAGNOSIS — C8589 Other specified types of non-Hodgkin lymphoma, extranodal and solid organ sites: Secondary | ICD-10-CM

## 2013-02-21 NOTE — Progress Notes (Signed)
  Subjective:    Patient ID: Leonard Bentley, male    DOB: [birth year], 52 y.o.   MRN: [id number]  HPI He is here for consult. He did have a rash several days ago but now the rash has diminished. He also notes that Neurontin does help with his neuropathy secondary to chemotherapy. He has concerns over efficacy of the medication. He is also had a great deal of difficulty in the last several months dealing with alcohol abuse. He is now in counseling for this and has seen psychiatry. He is taking Klonopin. He is also being seen by Felix Pacini.   Review of Systems     Objective:   Physical Exam Alert and in no distress. Exam of his skin shows no obvious lesions.       Assessment & Plan:  Lymphoma, non-Hodgkin's  Alcohol abuse I reassured him that I found nothing to be concerned about in regard to his skin. Talked to him at length concerning the diagnosis of Hodgkin's disease and how he is handled it. He said he was quite calm during oral deal with after was over is when the drinking to place. I discussed his thoughts and feelings in regard to this explaining that anger, bargaining, depression etc. were normal. He seems to understand that he was using alcohol as a way to escape dealing with these thoughts and feelings. He will continue to followup with his psychiatrist and with counseling.

## 2013-02-27 ENCOUNTER — Other Ambulatory Visit: Payer: Self-pay | Admitting: Internal Medicine

## 2013-03-09 ENCOUNTER — Ambulatory Visit (INDEPENDENT_AMBULATORY_CARE_PROVIDER_SITE_OTHER): Payer: PRIVATE HEALTH INSURANCE | Admitting: Family Medicine

## 2013-03-09 DIAGNOSIS — T451X5A Adverse effect of antineoplastic and immunosuppressive drugs, initial encounter: Secondary | ICD-10-CM | POA: Insufficient documentation

## 2013-03-09 DIAGNOSIS — G620 Drug-induced polyneuropathy: Secondary | ICD-10-CM | POA: Insufficient documentation

## 2013-03-09 DIAGNOSIS — K602 Anal fissure, unspecified: Secondary | ICD-10-CM

## 2013-03-09 NOTE — Progress Notes (Signed)
  Subjective:    Patient ID: Leonard Bentley, male    DOB: Mar 23, 1961, 51 y.o.   MRN: [id number]  HPI He has a history of for the last 2 days seeing bright red blood per rectum. The initial encounter occurred after he had a episode of constipation. He did feel some stinging sensation in the anal area. Since then he has noted some blood in the stool. He has had a previous colonoscopy. He continues on medications listed in the chart. His had no bleeding from the nose, gums or hematuria. He continues on his gabapentin but does note that around 5:00 in the afternoon his stinging sensation reoccurs in his hands however later on in the evening it goes away.   Review of Systems     Objective:   Physical Exam aalert and in no distress. Exam of the anal area shows no hemorrhoids. Anoscopy showed no internal hemorrhoids or evidence of fissure.       Assessment & Plan:  Neuropathy due to chemotherapeutic drug, sequela  Rectal fissure recommend he continue present gabapentin dosing and be vigilant concerning his symptoms. Even though I didn't see a fissure his symptoms are certainly suggestive of it. Recommend softer BMs with fluids, bulk in diet and exercise. Return here symptoms continue.

## 2013-03-09 NOTE — Patient Instructions (Signed)
Make sure you have softer bowel movements by drinking plenty of fluids, bulk in your diet, exercise will keep you regular and also listen to your body meaning when you have to go go

## 2013-03-27 ENCOUNTER — Telehealth: Payer: Self-pay | Admitting: Family Medicine

## 2013-03-27 NOTE — Telephone Encounter (Signed)
Left pt message word for word He will be on the XARELTO for a total of 6 months to reduce his risk of recurrent clot. He may start working out but start out slowly.

## 2013-03-27 NOTE — Telephone Encounter (Signed)
He will be on the [facility] for a total of 6 months to reduce his risk of recurrent clot. He may start working out but start out slowly.

## 2013-03-27 NOTE — Telephone Encounter (Signed)
PT CALLED AND WANTED TO KNOW HOW MUCH LONGER HE WOULD BE ON XARELTO. HE ALSO STATES THAT HE LIKES TO WORK OUT AND IS UNSURE OF HOW HARD TO PUSH HIMSELF. HE WANTS TO KNOW IF HE IS STILL AT RISK FROM THE BLOOD CLOTS IN HIS LUNGS. PLEASE CALL PT. IF HE DOESN'T ANSWER LEAVE A MESSAGE ON VOICE MAIL.

## 2013-04-06 ENCOUNTER — Other Ambulatory Visit: Payer: Self-pay | Admitting: Internal Medicine

## 2013-04-27 ENCOUNTER — Telehealth: Payer: Self-pay | Admitting: Family Medicine

## 2013-04-27 ENCOUNTER — Other Ambulatory Visit: Payer: Self-pay

## 2013-04-27 MED ORDER — RIVAROXABAN 20 MG PO TABS: 20.0000 mg | ORAL_TABLET | Freq: Every day | ORAL | Status: DC

## 2013-04-27 NOTE — Telephone Encounter (Signed)
DR.LALONDE IS THIS OK 

## 2013-04-27 NOTE — Telephone Encounter (Signed)
Pt needs refill on xarelto sent to [facility] [address]

## 2013-04-27 NOTE — Telephone Encounter (Signed)
Called xarelto in because jcl printed it

## 2013-05-10 ENCOUNTER — Telehealth: Payer: Self-pay | Admitting: Family Medicine

## 2013-05-10 MED ORDER — GABAPENTIN 300 MG PO CAPS: ORAL_CAPSULE | ORAL | Status: DC

## 2013-05-10 NOTE — Telephone Encounter (Signed)
It appears that this may have been discussed with Dr. Susann Givens at recent visit.  Directions in chart state TID, so insurance will only allow 1 month supply (unless he can get 90 [place] supply locally, in which case we can rx for 270).  Call pt and see if Dr. Susann Givens mentioned titrating up; if he recommended change in dosing, then we can change directions, and alter quantity for monthly amount

## 2013-07-11 ENCOUNTER — Other Ambulatory Visit: Payer: Self-pay | Admitting: Internal Medicine

## 2013-07-11 ENCOUNTER — Ambulatory Visit: Payer: PRIVATE HEALTH INSURANCE | Admitting: Internal Medicine

## 2013-07-11 ENCOUNTER — Other Ambulatory Visit (HOSPITAL_BASED_OUTPATIENT_CLINIC_OR_DEPARTMENT_OTHER): Payer: PRIVATE HEALTH INSURANCE

## 2013-07-11 ENCOUNTER — Encounter (HOSPITAL_COMMUNITY): Payer: Self-pay

## 2013-07-11 ENCOUNTER — Ambulatory Visit (HOSPITAL_COMMUNITY)
Admission: RE | Admit: 2013-07-11 | Discharge: 2013-07-11 | Disposition: A | Discharge status: Home / Self Care | Payer: PRIVATE HEALTH INSURANCE | Source: Ambulatory Visit | Attending: Internal Medicine | Admitting: Internal Medicine

## 2013-07-11 DIAGNOSIS — R918 Other nonspecific abnormal finding of lung field: Secondary | ICD-10-CM | POA: Insufficient documentation

## 2013-07-11 DIAGNOSIS — K7689 Other specified diseases of liver: Secondary | ICD-10-CM | POA: Insufficient documentation

## 2013-07-11 DIAGNOSIS — C8581 Other specified types of non-Hodgkin lymphoma, lymph nodes of head, face, and neck: Secondary | ICD-10-CM

## 2013-07-11 DIAGNOSIS — C8590 Non-Hodgkin lymphoma, unspecified, unspecified site: Secondary | ICD-10-CM

## 2013-07-11 DIAGNOSIS — I2699 Other pulmonary embolism without acute cor pulmonale: Secondary | ICD-10-CM | POA: Insufficient documentation

## 2013-07-11 DIAGNOSIS — C8589 Other specified types of non-Hodgkin lymphoma, extranodal and solid organ sites: Secondary | ICD-10-CM | POA: Insufficient documentation

## 2013-07-11 LAB — CBC WITH DIFFERENTIAL/PLATELET
BASO%: 1.3 % (ref 0.0–2.0)
Basophils Absolute: 0 10*3/uL (ref 0.0–0.1)
EOS%: 3.4 % (ref 0.0–7.0)
Eosinophils Absolute: 0.1 10*3/uL (ref 0.0–0.5)
HCT: 34.3 % — ABNORMAL LOW (ref 38.4–49.9)
HGB: 10.9 g/dL — ABNORMAL LOW (ref 13.0–17.1)
LYMPH%: 19.5 % (ref 14.0–49.0)
MCH: 26.1 pg — ABNORMAL LOW (ref 27.2–33.4)
MCHC: 31.8 g/dL — ABNORMAL LOW (ref 32.0–36.0)
MCV: 82.3 fL (ref 79.3–98.0)
MONO#: 0.4 10*3/uL (ref 0.1–0.9)
MONO%: 14.1 % — ABNORMAL HIGH (ref 0.0–14.0)
NEUT#: 1.8 10*3/uL (ref 1.5–6.5)
NEUT%: 61.7 % (ref 39.0–75.0)
Platelets: 194 10*3/uL (ref 140–400)
RBC: 4.17 10*6/uL — ABNORMAL LOW (ref 4.20–5.82)
RDW: 15.4 % — ABNORMAL HIGH (ref 11.0–14.6)
WBC: 3 10*3/uL — ABNORMAL LOW (ref 4.0–10.3)
lymph#: 0.6 10*3/uL — ABNORMAL LOW (ref 0.9–3.3)
nRBC: 1 % — ABNORMAL HIGH (ref 0–0)

## 2013-07-11 LAB — COMPREHENSIVE METABOLIC PANEL (CC13)
ALT: 80 U/L — ABNORMAL HIGH (ref 0–55)
AST: 66 U/L — ABNORMAL HIGH (ref 5–34)
Albumin: 3.8 g/dL (ref 3.5–5.0)
Alkaline Phosphatase: 56 U/L (ref 40–150)
BUN: 17.3 mg/dL (ref 7.0–26.0)
CO2: 23 mEq/L (ref 22–29)
Calcium: 9.6 mg/dL (ref 8.4–10.4)
Chloride: 104 mEq/L (ref 98–109)
Creatinine: 1.3 mg/dL (ref 0.7–1.3)
Glucose: 100 mg/dl (ref 70–140)
Potassium: 4.2 mEq/L (ref 3.5–5.1)
Sodium: 141 mEq/L (ref 136–145)
Total Bilirubin: 0.5 mg/dL (ref 0.20–1.20)
Total Protein: 7.2 g/dL (ref 6.4–8.3)

## 2013-07-11 MED ORDER — IOHEXOL 300 MG/ML  SOLN
100.0000 mL | Freq: Once | INTRAMUSCULAR | Status: AC | PRN
  Administered 2013-07-11: 100 mL via INTRAVENOUS

## 2013-07-12 ENCOUNTER — Encounter: Payer: Self-pay | Admitting: Internal Medicine

## 2013-07-12 ENCOUNTER — Ambulatory Visit (HOSPITAL_BASED_OUTPATIENT_CLINIC_OR_DEPARTMENT_OTHER): Payer: PRIVATE HEALTH INSURANCE | Admitting: Internal Medicine

## 2013-07-12 ENCOUNTER — Telehealth: Payer: Self-pay | Admitting: Internal Medicine

## 2013-07-12 VITALS — BP 115/78 | HR 71 | Temp 96.8°F | Resp 18 | Ht 70.0 in | Wt 176.9 lb

## 2013-07-12 DIAGNOSIS — C8581 Other specified types of non-Hodgkin lymphoma, lymph nodes of head, face, and neck: Secondary | ICD-10-CM

## 2013-07-12 DIAGNOSIS — I2699 Other pulmonary embolism without acute cor pulmonale: Secondary | ICD-10-CM

## 2013-07-12 DIAGNOSIS — C8590 Non-Hodgkin lymphoma, unspecified, unspecified site: Secondary | ICD-10-CM

## 2013-07-12 NOTE — Telephone Encounter (Signed)
gv and printed appt sched and avs for pt for March 2015...he did not want barium at this time will come back to get.

## 2013-07-12 NOTE — Progress Notes (Signed)
[facility] [phone]   [phone]  OFFICE PROGRESS NOTE  Carollee Herter, MD [address] [place] [address]  DIAGNOSIS:  1) Stage II, large B-cell non-Hodgkin's lymphoma diagnosed in September 2012 with a scalp lesion. In addition he had mediastinal lymphadenopathy.  2) bilateral pulmonary embolism diagnosed on CT scan of the chest on 12/28/2012.   PRIOR THERAPY: Systemic chemotherapy with CHOP/Rituxan, given every 3 weeks with Neulasta support, status post 6 cycles, last dose was given 12/01/2011 with complete response.   CURRENT THERAPY: Xarelto 20 mg by mouth a [place].   INTERVAL HISTORY: Leonard Bentley 52 y.o. male returns to the clinic today for six-month followup visit. The patient is feeling fine today with no specific complaints. He denied having any significant fatigue weakness. He denied having any weight loss or night sweats. The patient exercises at regular basis. He denied having any chest pain, shortness breath, cough or hemoptysis. He has repeat CT scan of the head, chest, abdomen and pelvis performed recently and he is here for evaluation and discussion of his scan results.  MEDICAL HISTORY: Past Medical History  Diagnosis Date  . ED (erectile dysfunction)   . Cancer 07/2011    STAGE II NON- HODGKIN LYMPHOMA  . Lymphoma, non Hodgkin's 09/25/2011  . Pulmonary emboli   . Depression     ALLERGIES:  has No Known Allergies.  MEDICATIONS:  Current Outpatient Prescriptions  Medication Sig Dispense Refill  . Cyanocobalamin (VITAMIN B 12 PO) Take 1 tablet by mouth daily.      Marland Kitchen gabapentin (NEURONTIN) 300 MG capsule TAKE ONE CAPSULE BY MOUTH THREE TIMES DAILY  90 capsule  5  . Multiple Vitamin (MULTIVITAMIN WITH MINERALS) TABS Take 1 tablet by mouth daily.  30 tablet  0  . Rivaroxaban (XARELTO) 20 MG TABS Take 1 tablet (20 mg total) by mouth daily.  30 tablet  1  . clonazePAM (KLONOPIN) 1 MG tablet Take 1 mg by  mouth 2 (two) times daily as needed for anxiety.      Marland Kitchen LORazepam (ATIVAN) 1 MG tablet Take 1 tablet (1 mg total) by mouth every 6 (six) hours as needed for anxiety.  12 tablet  0   No current facility-administered medications for this visit.    SURGICAL HISTORY:  Past Surgical History  Procedure Laterality Date  . No past surgeries      REVIEW OF SYSTEMS:  A comprehensive review of systems was negative.   PHYSICAL EXAMINATION: General appearance: alert, cooperative and no distress Head: Normocephalic, without obvious abnormality, atraumatic Neck: no adenopathy Lymph nodes: Cervical, supraclavicular, and axillary nodes normal. Resp: clear to auscultation bilaterally Cardio: regular rate and rhythm, S1, S2 normal, no murmur, click, rub or gallop GI: soft, non-tender; bowel sounds normal; no masses,  no organomegaly Extremities: extremities normal, atraumatic, no cyanosis or edema  ECOG PERFORMANCE STATUS: 0 - Asymptomatic  Blood pressure 115/78, pulse 71, temperature 96.8 F (36 C), temperature source Oral, resp. rate 18, height 5\' 10"  (1.778 m), weight 176 lb 14.4 oz (80.241 kg).  LABORATORY DATA: Lab Results  Component Value Date   WBC 3.0* 07/11/2013   HGB 10.9* 07/11/2013   HCT 34.3* 07/11/2013   MCV 82.3 07/11/2013   PLT 194 07/11/2013      Chemistry      Component Value Date/Time   NA 141 07/11/2013 0825   NA 136 02/01/2013 0512   NA 137 06/30/2012 0754   K 4.2 07/11/2013  0825   K 4.5 02/01/2013 0512   K 3.8 06/30/2012 0754   CL 100 02/01/2013 0512   CL 98 12/28/2012 0824   CL 97* 06/30/2012 0754   CO2 23 07/11/2013 0825   CO2 28 02/01/2013 0512   CO2 30 06/30/2012 0754   BUN 17.3 07/11/2013 0825   BUN 12 02/01/2013 0512   BUN 14 06/30/2012 0754   CREATININE 1.3 07/11/2013 0825   CREATININE 0.82 02/01/2013 0512   CREATININE 1.0 06/30/2012 0754      Component Value Date/Time   CALCIUM 9.6 07/11/2013 0825   CALCIUM 8.6 02/01/2013 0512   CALCIUM 8.7 06/30/2012 0754   ALKPHOS 56 07/11/2013  0825   ALKPHOS 51 02/02/2013 0435   ALKPHOS 43 06/30/2012 0754   AST 66* 07/11/2013 0825   AST 162* 02/02/2013 0435   AST 62* 06/30/2012 0754   ALT 80* 07/11/2013 0825   ALT 134* 02/02/2013 0435   ALT 39 06/30/2012 0754   BILITOT 0.50 07/11/2013 0825   BILITOT 0.9 02/02/2013 0435   BILITOT 1.60 06/30/2012 0754       RADIOGRAPHIC STUDIES: Ct Head [name]  07/11/2013   CLINICAL DATA:  52 year old male with lymphoma. Restaging. History of scalp lesion.  EXAM: CT HEAD WITHOUT AND WITH CONTRAST  TECHNIQUE: Contiguous axial images were obtained from the base of the skull through the vertex without and with intravenous contrast  CONTRAST:  OMNIPAQUE IOHEXOL 300 MG/ML SOLN. In conjunction with CTs of the chest, abdomen, and pelvis from the same [facility] reported separately.  COMPARISON:  Head CTs without and with contrast 12/2012 and earlier.  FINDINGS: Left lateral convexity superficial scalp soft tissue lesions seen in 2012 have regressed, and are virtually resolved. Faint residual on series 11, image 18. Other scalp soft tissues are within normal limits. Visualized orbit soft tissues are within normal limits.  Visualized paranasal sinuses and mastoids are clear.  Stable cerebral volume. No midline shift, ventriculomegaly, mass effect, evidence of mass lesion, intracranial hemorrhage or evidence of cortically based acute infarction. Gray-white matter differentiation is within normal limits throughout the brain. No abnormal enhancement identified.  IMPRESSION: 1. Stable and negative CT appearance of the brain.  2. Small left lateral scalp convexity soft tissue lesions described on 08/09/2011 are virtually resolved.  3. Chest, abdomen, and pelvis reported separately.   Electronically Signed   By: Augusto Gamble   On: 07/11/2013 09:52   Ct Chest W Contrast  07/11/2013   *RADIOLOGY REPORT*  Clinical Data:  Lymphoma restaging  CT CHEST, ABDOMEN AND PELVIS WITH CONTRAST  Technique:  Multidetector CT imaging of the chest,  abdomen and pelvis was performed following the standard protocol during bolus administration of intravenous contrast.  Contrast: OMNIPAQUE IOHEXOL 300 MG/ML  SOLN  Comparison:  CT to 22,014    CT CHEST  Findings:  No axillary or supraclavicular lymphadenopathy. Mediastinal lymphadenopathy is similar to prior.  For example 17 mm precarinal lymph node (image 21) compares to 16 mm on prior.  11 mm prevascular lymph node compares to 10 mm on prior.  24 mm right hilar lymph node compares to 23 mm on prior.  There is interval clearing of the pulmonary emboli within the right pulmonary arteries.  There is some residual embolus in the right lower lobe pulmonary artery.  Review of the lung parenchyma shows a 4 mm nodule in the right middle lobe (image 33) not changed.  No new pulmonary nodules  IMPRESSION:  1.  Stable  mediastinal lymphadenopathy. 2.  Interval clearing of the  pulmonary emboli with some chronic emboli.    CT ABDOMEN AND PELVIS  Findings:  Multiple low-density lesions within the liver which are not changed in size or number compared to prior most consistent with benign hepatic cysts.  Gallbladder, pancreas, spleen, adrenal glands, and kidneys are normal.  The stomach, small bowel, and colon unremarkable.  Abdominal aorta normal caliber.  No retroperitoneal periportal lymphadenopathy.  No free fluid the pelvis.  No pelvic lymphadenopathy.  Prostate gland and bladder are normal. Review of  bone windows demonstrates no aggressive osseous lesions.  IMPRESSION:  1.  No evidence of abdominal or  pelvic lymphadenopathy. 2.  Multiple benign hepatic cysts.   Original Report Authenticated By: Genevive Bi, M.D.   ASSESSMENT AND PLAN: This is a very pleasant 52 years old African American male with history of stage II large B-cell non-Hodgkin lymphoma status post systemic chemotherapy with CHOP/Rituxan with complete response and has been observation since January of 2013 was no evidence for disease  recurrence. I discussed the scan results with the patient today. I recommended for him to continue on observation with repeat CT scan of the head, neck, chest, abdomen and pelvis in 6 months. Regarding the history of pulmonary embolism, I recommended for the patient to complete a total of one year of treatment with Xarelto,  He was advised to call immediately if he has any concerning symptoms in the interval.  The patient voices understanding of current disease status and treatment options and is in agreement with the current care plan.  All questions were answered. The patient knows to call the clinic with any problems, questions or concerns. We can certainly see the patient much sooner if necessary.

## 2013-07-14 NOTE — Patient Instructions (Signed)
Followup visit in 6 months with repeat CT scan of the head, neck, chest, abdomen and pelvis.

## 2013-11-22 ENCOUNTER — Telehealth: Payer: Self-pay | Admitting: *Deleted

## 2013-11-22 NOTE — Telephone Encounter (Signed)
Pt called stating that he found a bump that is sore on his neck last night that felt like the one he originally found when he was first diagnosed.  It is smaller in size.  However, now it appears to be getting better.  Per Dr Vista Mink, if needed patient can have a f/u with AJ to assess the bump to see if we need to move up his CT scan and f/u that is scheduled in March.  Pt states that since it seems better today he will keep watching it and if it seems to be getting worse or changes he will call back and let us know.  SLJ

## 2013-12-20 ENCOUNTER — Encounter: Payer: Self-pay | Admitting: Family Medicine

## 2013-12-20 ENCOUNTER — Ambulatory Visit (INDEPENDENT_AMBULATORY_CARE_PROVIDER_SITE_OTHER): Payer: PRIVATE HEALTH INSURANCE | Admitting: Family Medicine

## 2013-12-20 VITALS — BP 112/76 | HR 72 | Wt 189.0 lb

## 2013-12-20 DIAGNOSIS — M6530 Trigger finger, unspecified finger: Secondary | ICD-10-CM

## 2013-12-20 DIAGNOSIS — M65312 Trigger thumb, left thumb: Principal | ICD-10-CM

## 2013-12-20 DIAGNOSIS — M65311 Trigger thumb, right thumb: Secondary | ICD-10-CM

## 2013-12-20 NOTE — Progress Notes (Signed)
   Subjective:    Patient ID: Leonard Bentley, male    DOB: 08/24/1961, 53 y.o.   MRN: [id number]  HPI  he has a two-week history of bilateral thumb pain. 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 He describes a clicking sensation bilaterally   Review of Systems     Objective:   Physical Exam Exam of the thumb shows good motion however clicking sensation is noted in the DIPs joint bilaterally. No joint swelling noted. Full motion of the wrist.       Assessment & Plan:  Trigger thumb of both thumbs - Plan: Ambulatory referral to Orthopedic Surgery  also discussed possibly changing the mechanics of doing the grinding in order to take pressure off of the thumb.

## 2013-12-21 ENCOUNTER — Telehealth: Payer: Self-pay | Admitting: Internal Medicine

## 2013-12-21 NOTE — Telephone Encounter (Signed)
lvm for pt regarding to 3.5.15 appt moved to 3.9 per MD request on pal

## 2014-01-07 ENCOUNTER — Telehealth: Payer: Self-pay | Admitting: Family Medicine

## 2014-01-07 MED ORDER — RIVAROXABAN 20 MG PO TABS: 20.0000 mg | ORAL_TABLET | Freq: Every day | ORAL | Status: DC

## 2014-01-07 NOTE — Telephone Encounter (Signed)
Pt needs refill on Xarelto sent to [facility] [address]

## 2014-01-07 NOTE — Telephone Encounter (Signed)
XARELTO renewed

## 2014-01-08 ENCOUNTER — Telehealth: Payer: Self-pay | Admitting: Internal Medicine

## 2014-01-08 NOTE — Telephone Encounter (Signed)
lvm for pt regarding to all March appts

## 2014-01-09 ENCOUNTER — Other Ambulatory Visit: Payer: PRIVATE HEALTH INSURANCE

## 2014-01-09 ENCOUNTER — Ambulatory Visit (HOSPITAL_COMMUNITY): Payer: PRIVATE HEALTH INSURANCE

## 2014-01-10 ENCOUNTER — Ambulatory Visit: Payer: PRIVATE HEALTH INSURANCE | Admitting: Internal Medicine

## 2014-01-14 ENCOUNTER — Ambulatory Visit: Payer: PRIVATE HEALTH INSURANCE | Admitting: Internal Medicine

## 2014-01-14 ENCOUNTER — Other Ambulatory Visit: Payer: PRIVATE HEALTH INSURANCE

## 2014-01-16 ENCOUNTER — Other Ambulatory Visit: Payer: Self-pay | Admitting: Internal Medicine

## 2014-01-16 ENCOUNTER — Encounter (HOSPITAL_COMMUNITY): Payer: Self-pay

## 2014-01-16 ENCOUNTER — Other Ambulatory Visit (HOSPITAL_BASED_OUTPATIENT_CLINIC_OR_DEPARTMENT_OTHER): Payer: PRIVATE HEALTH INSURANCE

## 2014-01-16 ENCOUNTER — Ambulatory Visit (HOSPITAL_COMMUNITY)
Admission: RE | Admit: 2014-01-16 | Discharge: 2014-01-16 | Disposition: A | Discharge status: Home / Self Care | Payer: PRIVATE HEALTH INSURANCE | Source: Ambulatory Visit | Attending: Internal Medicine | Admitting: Internal Medicine

## 2014-01-16 DIAGNOSIS — C8590 Non-Hodgkin lymphoma, unspecified, unspecified site: Secondary | ICD-10-CM

## 2014-01-16 DIAGNOSIS — C8589 Other specified types of non-Hodgkin lymphoma, extranodal and solid organ sites: Secondary | ICD-10-CM | POA: Insufficient documentation

## 2014-01-16 DIAGNOSIS — C8581 Other specified types of non-Hodgkin lymphoma, lymph nodes of head, face, and neck: Secondary | ICD-10-CM

## 2014-01-16 DIAGNOSIS — R918 Other nonspecific abnormal finding of lung field: Secondary | ICD-10-CM | POA: Insufficient documentation

## 2014-01-16 DIAGNOSIS — Z9221 Personal history of antineoplastic chemotherapy: Secondary | ICD-10-CM | POA: Insufficient documentation

## 2014-01-16 DIAGNOSIS — I2782 Chronic pulmonary embolism: Secondary | ICD-10-CM | POA: Insufficient documentation

## 2014-01-16 LAB — CBC WITH DIFFERENTIAL/PLATELET
BASO%: 1.8 % (ref 0.0–2.0)
Basophils Absolute: 0.1 10*3/uL (ref 0.0–0.1)
EOS%: 5.4 % (ref 0.0–7.0)
Eosinophils Absolute: 0.2 10*3/uL (ref 0.0–0.5)
HCT: 36.9 % — ABNORMAL LOW (ref 38.4–49.9)
HGB: 11.7 g/dL — ABNORMAL LOW (ref 13.0–17.1)
LYMPH%: 13.2 % — ABNORMAL LOW (ref 14.0–49.0)
MCH: 25.5 pg — ABNORMAL LOW (ref 27.2–33.4)
MCHC: 31.8 g/dL — ABNORMAL LOW (ref 32.0–36.0)
MCV: 80 fL (ref 79.3–98.0)
MONO#: 0.7 10*3/uL (ref 0.1–0.9)
MONO%: 21.1 % — ABNORMAL HIGH (ref 0.0–14.0)
NEUT#: 1.8 10*3/uL (ref 1.5–6.5)
NEUT%: 58.5 % (ref 39.0–75.0)
Platelets: 181 10*3/uL (ref 140–400)
RBC: 4.61 10*6/uL (ref 4.20–5.82)
RDW: 15.3 % — ABNORMAL HIGH (ref 11.0–14.6)
WBC: 3.1 10*3/uL — ABNORMAL LOW (ref 4.0–10.3)
lymph#: 0.4 10*3/uL — ABNORMAL LOW (ref 0.9–3.3)

## 2014-01-16 LAB — COMPREHENSIVE METABOLIC PANEL (CC13)
ALT: 35 U/L (ref 0–55)
AST: 45 U/L — ABNORMAL HIGH (ref 5–34)
Albumin: 4.1 g/dL (ref 3.5–5.0)
Alkaline Phosphatase: 49 U/L (ref 40–150)
Anion Gap: 14 mEq/L — ABNORMAL HIGH (ref 3–11)
BUN: 17.1 mg/dL (ref 7.0–26.0)
CO2: 23 mEq/L (ref 22–29)
Calcium: 9.6 mg/dL (ref 8.4–10.4)
Chloride: 104 mEq/L (ref 98–109)
Creatinine: 1 mg/dL (ref 0.7–1.3)
Glucose: 97 mg/dl (ref 70–140)
Potassium: 4.2 mEq/L (ref 3.5–5.1)
Sodium: 141 mEq/L (ref 136–145)
Total Bilirubin: 0.34 mg/dL (ref 0.20–1.20)
Total Protein: 7.4 g/dL (ref 6.4–8.3)

## 2014-01-16 LAB — LACTATE DEHYDROGENASE (CC13): LDH: 262 U/L — ABNORMAL HIGH (ref 125–245)

## 2014-01-16 MED ORDER — IOHEXOL 300 MG/ML  SOLN: 100.0000 mL | Freq: Once | INTRAMUSCULAR | Status: DC | PRN

## 2014-01-16 MED ORDER — IOHEXOL 300 MG/ML  SOLN
125.0000 mL | Freq: Once | INTRAMUSCULAR | Status: AC | PRN
  Administered 2014-01-16: 125 mL via INTRAVENOUS

## 2014-01-28 ENCOUNTER — Ambulatory Visit (HOSPITAL_BASED_OUTPATIENT_CLINIC_OR_DEPARTMENT_OTHER): Payer: PRIVATE HEALTH INSURANCE | Admitting: Internal Medicine

## 2014-01-28 ENCOUNTER — Other Ambulatory Visit: Payer: Self-pay

## 2014-01-28 ENCOUNTER — Encounter: Payer: Self-pay | Admitting: Internal Medicine

## 2014-01-28 ENCOUNTER — Telehealth: Payer: Self-pay | Admitting: Internal Medicine

## 2014-01-28 VITALS — BP 118/74 | HR 68 | Temp 97.2°F | Resp 18 | Ht 70.0 in | Wt 191.8 lb

## 2014-01-28 DIAGNOSIS — F329 Major depressive disorder, single episode, unspecified: Secondary | ICD-10-CM

## 2014-01-28 DIAGNOSIS — F3289 Other specified depressive episodes: Secondary | ICD-10-CM

## 2014-01-28 DIAGNOSIS — C8581 Other specified types of non-Hodgkin lymphoma, lymph nodes of head, face, and neck: Secondary | ICD-10-CM

## 2014-01-28 DIAGNOSIS — C8590 Non-Hodgkin lymphoma, unspecified, unspecified site: Secondary | ICD-10-CM

## 2014-01-28 DIAGNOSIS — I2699 Other pulmonary embolism without acute cor pulmonale: Secondary | ICD-10-CM

## 2014-01-28 NOTE — Progress Notes (Signed)
[place] [phone]   Fax:([phone]) [place], [place] [address]  DIAGNOSIS:  1) Stage II, large B-cell non-Hodgkin's lymphoma diagnosed in September 2012 with a scalp lesion. In addition he had mediastinal lymphadenopathy.  2) bilateral pulmonary embolism diagnosed on CT scan of the chest on 12/28/2012.   PRIOR THERAPY: Systemic chemotherapy with CHOP/Rituxan, given every 3 weeks with Neulasta support, status post 6 cycles, last dose was given 12/01/2011 with complete response.   CURRENT THERAPY: Xarelto 20 mg by mouth a Gesner.  INTERVAL HISTORY: Leonard Bentley 53 y.o. male returns to the clinic today for six-month followup visit. The patient is feeling fine today with no specific complaints. He denied having any significant fatigue weakness. He denied having any weight loss or night sweats. The patient exercises at regular basis and he is currently using steroids for body building. He denied having any chest pain, shortness breath, cough or hemoptysis. He has repeat CT scan of the head, chest, abdomen and pelvis performed recently and he is here for evaluation and discussion of his scan results.  MEDICAL HISTORY: Past Medical History  Diagnosis Date  . ED (erectile dysfunction)   . Pulmonary emboli   . Depression   . Cancer 07/2011    STAGE II NON- HODGKIN LYMPHOMA  . Lymphoma, non Hodgkin's 09/25/2011    ALLERGIES:  has No Known Allergies.  MEDICATIONS:  Current Outpatient Prescriptions  Medication Sig Dispense Refill  . Cyanocobalamin (VITAMIN B 12 PO) Take 1 tablet by mouth daily.      . Multiple Vitamin (MULTIVITAMIN WITH MINERALS) TABS Take 1 tablet by mouth daily.  30 tablet  0  . clonazePAM (KLONOPIN) 1 MG tablet Take 1 mg by mouth 2 (two) times daily as needed for anxiety.      . gabapentin (NEURONTIN) 300 MG capsule TAKE ONE CAPSULE BY MOUTH THREE TIMES DAILY  90  capsule  5  . LORazepam (ATIVAN) 1 MG tablet Take 1 tablet (1 mg total) by mouth every 6 (six) hours as needed for anxiety.  12 tablet  0  . Rivaroxaban (XARELTO) 20 MG TABS tablet Take 1 tablet (20 mg total) by mouth daily.  30 tablet  5   No current facility-administered medications for this visit.    SURGICAL HISTORY:  Past Surgical History  Procedure Laterality Date  . No past surgeries      REVIEW OF SYSTEMS:  A comprehensive review of systems was negative.   PHYSICAL EXAMINATION: General appearance: alert, cooperative and no distress Head: Normocephalic, without obvious abnormality, atraumatic Neck: no adenopathy Lymph nodes: Cervical, supraclavicular, and axillary nodes normal. Resp: clear to auscultation bilaterally Cardio: regular rate and rhythm, S1, S2 normal, no murmur, click, rub or gallop GI: soft, non-tender; bowel sounds normal; no masses,  no organomegaly Extremities: extremities normal, atraumatic, no cyanosis or edema  ECOG PERFORMANCE STATUS: 0 - Asymptomatic  Blood pressure 118/74, pulse 68, temperature 97.2 F (36.2 C), temperature source Oral, resp. rate 18, height 5\' 10"  (1.778 m), weight 191 lb 12.8 oz (87 kg), SpO2 100.00%.  LABORATORY DATA: Lab Results  Component Value Date   WBC 3.1* 01/16/2014   HGB 11.7* 01/16/2014   HCT 36.9* 01/16/2014   MCV 80.0 01/16/2014   PLT 181 01/16/2014      Chemistry      Component Value Date/Time   NA 141 01/16/2014 0814   NA 136 02/01/2013 0512  NA 137 06/30/2012 0754   K 4.2 01/16/2014 0814   K 4.5 02/01/2013 0512   K 3.8 06/30/2012 0754   CL 100 02/01/2013 0512   CL 98 12/28/2012 0824   CL 97* 06/30/2012 0754   CO2 23 01/16/2014 0814   CO2 28 02/01/2013 0512   CO2 30 06/30/2012 0754   BUN 17.1 01/16/2014 0814   BUN 12 02/01/2013 0512   BUN 14 06/30/2012 0754   CREATININE 1.0 01/16/2014 0814   CREATININE 0.82 02/01/2013 0512   CREATININE 1.0 06/30/2012 0754      Component Value Date/Time   CALCIUM 9.6 01/16/2014 0814     CALCIUM 8.6 02/01/2013 0512   CALCIUM 8.7 06/30/2012 0754   ALKPHOS 49 01/16/2014 0814   ALKPHOS 51 02/02/2013 0435   ALKPHOS 43 06/30/2012 0754   AST 45* 01/16/2014 0814   AST 162* 02/02/2013 0435   AST 62* 06/30/2012 0754   ALT 35 01/16/2014 0814   ALT 134* 02/02/2013 0435   ALT 39 06/30/2012 0754   BILITOT 0.34 01/16/2014 0814   BILITOT 0.9 02/02/2013 0435   BILITOT 1.60 06/30/2012 0754       RADIOGRAPHIC STUDIES: Ct Head [name]  01/16/2014   CLINICAL DATA Non-Hodgkin's lymphoma diagnosed 9/12 with chemotherapy completed.  EXAM CT HEAD WITHOUT AND WITH CONTRAST  TECHNIQUE Contiguous axial images were obtained from the base of the skull through the vertex without and with intravenous contrast  CONTRAST 182mL OMNIPAQUE IOHEXOL 300 MG/ML  SOLN  COMPARISON 07/11/2013  FINDINGS Sinuses/Soft tissues: No residual scalp soft tissue lesion identified. Clear paranasal sinuses and mastoid air cells.  Intracranial: No mass lesion, hemorrhage, hydrocephalus, acute infarct, intra-axial, or extra-axial fluid collection. Post-contrast images demonstrate no enhancing lesions.  IMPRESSION No acute intracranial abnormality.  No evidence of active lymphoma.  SIGNATURE  Electronically Signed   By: Abigail Miyamoto M.D.   On: 01/16/2014 09:57   Ct Soft Tissue Neck W Contrast  01/16/2014   CLINICAL DATA Lymphoma.  Restaging.  Non-Hodgkin's type.  EXAM CT NECK WITH CONTRAST  TECHNIQUE Multidetector CT imaging of the neck was performed using the standard protocol following the bolus administration of intravenous contrast.  CONTRAST 183mL OMNIPAQUE IOHEXOL 300 MG/ML  SOLN  COMPARISON CT HEAD WO/W CM dated 07/11/2013; NM PET IMAGE RESTAG (PS) SKULL BASE TO THIGH dated 01/04/2012; CT NECK W/CM dated 10/14/2011  FINDINGS Limited intracranial imaging is unremarkable. Normal orbits and globes. Normal nasopharynx, oral pharynx, hypopharynx, and larynx.  The thyroid gland, submandibular glands, and parotid glands enhance  symmetrically.  No cervical adenopathy.  All vascular structures enhance normally.  Bone windows demonstrate mild right maxillary sinus mucosal thickening.  IMPRESSION No cervical adenopathy to suggest residual or recurrent lymphoma.  SIGNATURE  Electronically Signed   By: Abigail Miyamoto M.D.   On: 01/16/2014 10:02   Ct Chest W Contrast  01/16/2014   CLINICAL DATA Non-Hodgkin's lymphoma.  Diagnosed in 2012.  Restaging.  EXAM CT CHEST, ABDOMEN, AND PELVIS WITH CONTRAST  TECHNIQUE Multidetector CT imaging of the chest, abdomen and pelvis was performed following the standard protocol during bolus administration of intravenous contrast.  CONTRAST 167mL OMNIPAQUE IOHEXOL 300 MG/ML  SOLN  COMPARISON CT CHEST W/CM dated 07/11/2013; CT ABD/PELVIS W CM dated 07/11/2013  FINDINGS   CT CHEST FINDINGS  Lungs/Pleura: Stable 1 mm right upper lobe lung nodule on image 22.  Perifissural 4 mm nodule along the right minor fissure on image 35 is unchanged.  No pleural fluid.  Heart/Mediastinum: No axillary adenopathy. Tortuous descending thoracic aorta. Mild cardiomegaly with left atrial enlargement. No pericardial effusion. Minimal residual chronic thrombus suspected in the right lower lobe bronchus on image 33/ series 5. No evidence of acute superimposed embolism.  1.6 cm precarinal node is unchanged on image 27.  A subcarinal node measures 1.6 cm on image 29 versus 1.8 cm on the prior exam (when remeasured).  Right hilar node measures 2.4 cm on image 26 and is similar. Prevascular node measures 1.1 cm on image 24 and is not significantly changed.    CT ABDOMEN AND PELVIS FINDINGS  Abdomen/Pelvis: Hepatic cysts and bile duct hamartomas are again identified. No suspicious liver lesion. Normal spleen, stomach, pancreas, gallbladder, biliary tract, adrenal glands. No retroperitoneal or retrocrural adenopathy. Normal colon and terminal ileum. Normal appendix. Normal small bowel without abdominal ascites.  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 Similar to minimally enlarged from 2.2 cm on the prior. The left external iliac is tortuous and mildly ectatic at 1.3 cm. Right common iliac artery ectasia at 1.7 cm. This is unchanged. Vascular calcifications within the scrotum. No pelvic adenopathy. Normal urinary bladder and prostate.  Bones/Musculoskeletal: No acute osseous abnormality. Thoracic degenerative disc disease at multiple levels. Disc bulges at L4-5 and L3-4.    IMPRESSION CT CHEST IMPRESSION  1. Similar thoracic adenopathy. 2. Similar low volume chronic pulmonary embolism. 3. Similar tiny nonspecific pulmonary nodules.  CT ABDOMEN AND PELVIS IMPRESSION  1. No acute process or evidence of active lymphoma within the abdomen or pelvis. 2. Pelvic aneurysms and ectasia, similar to minimally progressive.  SIGNATURE  Electronically Signed   By: Abigail Miyamoto M.D.   On: 01/16/2014 10:31   ASSESSMENT AND PLAN: This is a very pleasant 53 years old [place] American male with history of stage II large B-cell non-Hodgkin lymphoma status post systemic chemotherapy with CHOP/Rituxan with complete response and has been observation since January of 2013 with no evidence for disease recurrence. I discussed the scan results with the patient today. I recommended for him to continue on observation with repeat CT scan of the head, neck, chest, abdomen and pelvis in 1 year. Regarding the history of pulmonary embolism, he completed one year of treatment with Xarelto, and he can discontinue his treatment at this point. He was advised to call immediately if he has any concerning symptoms in the interval.  The patient voices understanding of current disease status and treatment options and is in agreement with the current care plan.  All questions were answered. The patient knows to call the clinic with any problems, questions or concerns. We can certainly see the patient much sooner if necessary.

## 2014-01-28 NOTE — Telephone Encounter (Signed)
gv and printed appt sched and avs for pt for March 2016...Marland Kitchenpt did not want barium at this time

## 2014-01-30 ENCOUNTER — Telehealth: Payer: Self-pay | Admitting: Family Medicine

## 2014-01-30 NOTE — Telephone Encounter (Signed)
He needs to get the note from orthopedics

## 2014-01-31 NOTE — Telephone Encounter (Signed)
Lm notifing pt of where he needs to get note from

## 2014-02-01 ENCOUNTER — Encounter: Payer: Self-pay | Admitting: Family Medicine

## 2014-02-01 ENCOUNTER — Ambulatory Visit (INDEPENDENT_AMBULATORY_CARE_PROVIDER_SITE_OTHER): Payer: PRIVATE HEALTH INSURANCE | Admitting: Family Medicine

## 2014-02-01 VITALS — BP 110/90 | HR 68 | Wt 187.0 lb

## 2014-02-01 DIAGNOSIS — L0291 Cutaneous abscess, unspecified: Secondary | ICD-10-CM

## 2014-02-01 DIAGNOSIS — L0390 Cellulitis, unspecified: Secondary | ICD-10-CM

## 2014-02-01 MED ORDER — DOXYCYCLINE HYCLATE 100 MG PO TABS: 100.0000 mg | ORAL_TABLET | Freq: Two times a day (BID) | ORAL | Status: DC

## 2014-02-01 NOTE — Progress Notes (Signed)
   Subjective:    Patient ID: Leonard Bentley, male    DOB: December 07, 1960, 53 y.o.   MRN: [id number]  HPI He apparently got too closehearing caused some damage to his left lateral calf area and now has an area that is slightly open, red and tender. He was seen by the company nurse and told to come here.   Review of Systems     Objective:   Physical Exam Mottled appearance is present in the left lateral calf area. There is an area of approximately 3 cm it is erythematous warm and tender.       Assessment & Plan:  Cellulitis - Plan: doxycycline (VIBRA-TABS) 100 MG tablet  he is to keep me informed as to how this is progressing and if this gets worse, he will return here.

## 2014-02-14 ENCOUNTER — Telehealth: Payer: Self-pay | Admitting: Internal Medicine

## 2014-02-14 DIAGNOSIS — L0390 Cellulitis, unspecified: Secondary | ICD-10-CM

## 2014-02-14 MED ORDER — DOXYCYCLINE HYCLATE 100 MG PO TABS: 100.0000 mg | ORAL_TABLET | Freq: Two times a day (BID) | ORAL | Status: DC

## 2014-02-14 NOTE — Telephone Encounter (Signed)
Let him know that I called in another ten-Blunck course of antibiotic and let me know if you still having difficulty when he finishes that.

## 2014-02-14 NOTE — Telephone Encounter (Signed)
LM on pt VM notifying him of message

## 2014-02-14 NOTE — Telephone Encounter (Signed)
Pt called stating that the sore on the leg is better and he finished his antibiotic but his leg is still tenderness and some soreness. Pt wants to know did you need to send in another rx. If so send to [facility] [place]

## 2014-02-25 ENCOUNTER — Telehealth: Payer: Self-pay | Admitting: *Deleted

## 2014-02-25 NOTE — Telephone Encounter (Signed)
Pt called wanting to know if he needs to continue xarelto.  Per Dr Vista Mink office note from 01/28/14, he completed a year of tx and it is okay to d/c tx with xarelto at this time.  Called and left a vm with information.  SLJ

## 2014-03-21 ENCOUNTER — Ambulatory Visit: Payer: PRIVATE HEALTH INSURANCE | Admitting: Family Medicine

## 2014-03-21 ENCOUNTER — Telehealth: Payer: Self-pay | Admitting: *Deleted

## 2014-03-21 NOTE — Telephone Encounter (Signed)
Patient calling in for advice about a knot he has noticed on his back. He states it is the size of a dime, not red, hot or draining. Patient had recent visit with Dr Julien Nordmann in March with scans. Patient has also been seen by PCP Dr Redmond School for this, who he said did not seemed too concerned. Patient asked if he was just being paranoid about the Lymphoma, so I educated patient on signs/symptoms of problems with lymphoma, none of which he currently has. Informed patient that this phone triage assessment would be for him to apply warm compresses several times a [place]. This could be a lipoma or sebaceous cysts that would not be related to his cancer. He then tells me he will be seeing Dr Redmond School again tomorrow for re-assessment. Instructed him that if there is any concern when he sees Dr Redmond School to have him call our office. Patient seemed relieved with given information and will keep appt with Dr Redmond School tomorrow.

## 2014-03-22 ENCOUNTER — Encounter: Payer: Self-pay | Admitting: Family Medicine

## 2014-03-22 ENCOUNTER — Ambulatory Visit (INDEPENDENT_AMBULATORY_CARE_PROVIDER_SITE_OTHER): Payer: PRIVATE HEALTH INSURANCE | Admitting: Family Medicine

## 2014-03-22 VITALS — BP 110/78 | HR 64 | Resp 16 | Ht 70.0 in | Wt 193.0 lb

## 2014-03-22 DIAGNOSIS — Z711 Person with feared health complaint in whom no diagnosis is made: Secondary | ICD-10-CM

## 2014-03-22 NOTE — Progress Notes (Signed)
   Subjective:    Patient ID: Leonard Bentley, male    DOB: 1960/12/04, 53 y.o.   MRN: [id number]  HPI He is here for evaluation of a lump that he feels in his left upper back area. He is very concerned because of his previous history of lymphoma. He also had a fleeting episode of sharp upper thoracic pain that lasted just a few seconds and now is gone.   Review of Systems     Objective:   Physical Exam Exam of his upper back area shows no palpable lesions or skin changes.      Assessment & Plan:  I reassured him that I found nothing to be concerned about. Concern about cancer without diagnosis

## 2014-06-27 ENCOUNTER — Encounter: Payer: Self-pay | Admitting: Family Medicine

## 2014-06-27 ENCOUNTER — Ambulatory Visit (INDEPENDENT_AMBULATORY_CARE_PROVIDER_SITE_OTHER): Payer: PRIVATE HEALTH INSURANCE | Admitting: Family Medicine

## 2014-06-27 VITALS — BP 112/68 | HR 86 | Wt 191.0 lb

## 2014-06-27 DIAGNOSIS — M7710 Lateral epicondylitis, unspecified elbow: Secondary | ICD-10-CM

## 2014-06-27 DIAGNOSIS — D229 Melanocytic nevi, unspecified: Secondary | ICD-10-CM

## 2014-06-27 DIAGNOSIS — M7711 Lateral epicondylitis, right elbow: Secondary | ICD-10-CM

## 2014-06-27 DIAGNOSIS — D239 Other benign neoplasm of skin, unspecified: Secondary | ICD-10-CM

## 2014-06-27 NOTE — Progress Notes (Signed)
   Subjective:    Patient ID: Leonard Bentley, male    DOB: 03/17/1961, 53 y.o.   MRN: [id number]  HPI He is here for evaluation of a lesion present on the right side of the face in the area of the zygomatic arch. He also complains of right lateral elbow pain especially with repetitive motion that occurs at work. He did demonstrate the motion which does place a lot of strain on the extensor tendon mechanism.   Review of Systems     Objective:   Physical Exam He has a 0.5 mm slightly raised pigmented lesion on the right zygomatic arch area. Exam of the right elbow does show some slight tenderness to palpation over the lateral upper condyle with provocative testing being positive.       Assessment & Plan:  Lateral epicondylitis, right  Benign mole  I explained that the mole is benign and no further intervention however if it does change, he will call me. Also discussed treatment of the lateral epicondyle by tying to do as many things as he can palms up and open and also keep his wrist in the neutral position. We also discussed the use of bracing and I demonstrated proper use of the brace.

## 2014-06-27 NOTE — Patient Instructions (Signed)
If the mole does change a look again. Keep your wrist in a flat position. If you have more trouble, he

## 2014-09-04 ENCOUNTER — Other Ambulatory Visit: Payer: Self-pay | Admitting: Nurse Practitioner

## 2014-11-20 ENCOUNTER — Encounter: Payer: Self-pay | Admitting: Family Medicine

## 2014-11-20 ENCOUNTER — Ambulatory Visit (INDEPENDENT_AMBULATORY_CARE_PROVIDER_SITE_OTHER): Payer: PRIVATE HEALTH INSURANCE | Admitting: Family Medicine

## 2014-11-20 VITALS — BP 110/70 | HR 74 | Wt 186.0 lb

## 2014-11-20 DIAGNOSIS — E559 Vitamin D deficiency, unspecified: Secondary | ICD-10-CM

## 2014-11-20 MED ORDER — VITAMIN D (ERGOCALCIFEROL) 1.25 MG (50000 UNIT) PO CAPS: 50000.0000 [IU] | ORAL_CAPSULE | ORAL | Status: DC

## 2014-11-20 NOTE — Patient Instructions (Signed)
You need 11-1998 international units per [employer] of vitamin D.

## 2014-11-20 NOTE — Progress Notes (Signed)
   Subjective:    Patient ID: Leonard Bentley, male    DOB: October 16, 1961, 54 y.o.   MRN: [id number]  HPI He is here for evaluation of a five-Stites history of difficulty with fatigue. His had no fever, chills, cough, sore throat, earache. He states that he is feeling slightly better today. He did have blood drawn at work on January 10. The blood work was reviewed and does show elevated LDH as well as a very low vitamin D level. Previous history indicates she was apparently given 50,000 units of vitamin D weekly several years ago but did not follow-up with continued supplementation.   Review of Systems     Objective:   Physical Exam alert and in no distress. Tympanic membranes and canals are normal. Throat is clear. Tonsils are normal. Neck is supple without adenopathy or thyromegaly. Cardiac exam shows a regular sinus rhythm without murmurs or gallops. Lungs are clear to auscultation. Review of his blood work did show a low vitamin D level as well as elevated LDH. No CBC was seen.       Assessment & Plan:  Vitamin D deficiency - Plan: Vitamin D, Ergocalciferol, (DRISDOL) 50000 UNITS CAPS capsule  I will have him check a vitamin D level as well as a Cmet in several months. The CBC report is supposed to be sent to me.

## 2014-11-21 ENCOUNTER — Encounter: Payer: Self-pay | Admitting: Family Medicine

## 2015-01-07 ENCOUNTER — Other Ambulatory Visit: Payer: Self-pay | Admitting: Medical Oncology

## 2015-01-07 DIAGNOSIS — C8590 Non-Hodgkin lymphoma, unspecified, unspecified site: Secondary | ICD-10-CM

## 2015-01-08 ENCOUNTER — Other Ambulatory Visit (HOSPITAL_BASED_OUTPATIENT_CLINIC_OR_DEPARTMENT_OTHER): Payer: PRIVATE HEALTH INSURANCE

## 2015-01-08 ENCOUNTER — Encounter (HOSPITAL_COMMUNITY): Payer: Self-pay

## 2015-01-08 ENCOUNTER — Other Ambulatory Visit: Payer: Self-pay | Admitting: Internal Medicine

## 2015-01-08 ENCOUNTER — Ambulatory Visit (HOSPITAL_COMMUNITY)
Admission: RE | Admit: 2015-01-08 | Discharge: 2015-01-08 | Disposition: A | Discharge status: Home / Self Care | Payer: No Typology Code available for payment source | Source: Ambulatory Visit | Attending: Internal Medicine | Admitting: Internal Medicine

## 2015-01-08 DIAGNOSIS — C8591 Non-Hodgkin lymphoma, unspecified, lymph nodes of head, face, and neck: Secondary | ICD-10-CM

## 2015-01-08 DIAGNOSIS — C8590 Non-Hodgkin lymphoma, unspecified, unspecified site: Secondary | ICD-10-CM

## 2015-01-08 DIAGNOSIS — R200 Anesthesia of skin: Secondary | ICD-10-CM | POA: Insufficient documentation

## 2015-01-08 LAB — CBC WITH DIFFERENTIAL/PLATELET
BASO%: 1.2 % (ref 0.0–2.0)
Basophils Absolute: 0 10*3/uL (ref 0.0–0.1)
EOS%: 1.6 % (ref 0.0–7.0)
Eosinophils Absolute: 0.1 10*3/uL (ref 0.0–0.5)
HCT: 40.6 % (ref 38.4–49.9)
HGB: 12.5 g/dL — ABNORMAL LOW (ref 13.0–17.1)
LYMPH%: 16.2 % (ref 14.0–49.0)
MCH: 24.7 pg — ABNORMAL LOW (ref 27.2–33.4)
MCHC: 30.9 g/dL — ABNORMAL LOW (ref 32.0–36.0)
MCV: 79.9 fL (ref 79.3–98.0)
MONO#: 0.5 10*3/uL (ref 0.1–0.9)
MONO%: 13.3 % (ref 0.0–14.0)
NEUT#: 2.4 10*3/uL (ref 1.5–6.5)
NEUT%: 67.7 % (ref 39.0–75.0)
Platelets: 147 10*3/uL (ref 140–400)
RBC: 5.08 10*6/uL (ref 4.20–5.82)
RDW: 14.6 % (ref 11.0–14.6)
WBC: 3.5 10*3/uL — ABNORMAL LOW (ref 4.0–10.3)
lymph#: 0.6 10*3/uL — ABNORMAL LOW (ref 0.9–3.3)

## 2015-01-08 LAB — COMPREHENSIVE METABOLIC PANEL (CC13)
ALT: 35 U/L (ref 0–55)
AST: 63 U/L — ABNORMAL HIGH (ref 5–34)
Albumin: 4.3 g/dL (ref 3.5–5.0)
Alkaline Phosphatase: 42 U/L (ref 40–150)
Anion Gap: 13 mEq/L — ABNORMAL HIGH (ref 3–11)
BUN: 19 mg/dL (ref 7.0–26.0)
CO2: 26 mEq/L (ref 22–29)
Calcium: 9.9 mg/dL (ref 8.4–10.4)
Chloride: 102 mEq/L (ref 98–109)
Creatinine: 1.3 mg/dL (ref 0.7–1.3)
EGFR: 74 mL/min/{1.73_m2} — ABNORMAL LOW (ref >90)
Glucose: 99 mg/dl (ref 70–140)
Potassium: 4.4 mEq/L (ref 3.5–5.1)
Sodium: 141 mEq/L (ref 136–145)
Total Bilirubin: 0.75 mg/dL (ref 0.20–1.20)
Total Protein: 7.5 g/dL (ref 6.4–8.3)

## 2015-01-08 MED ORDER — IOHEXOL 300 MG/ML  SOLN
100.0000 mL | Freq: Once | INTRAMUSCULAR | Status: AC | PRN
  Administered 2015-01-08: 100 mL via INTRAVENOUS

## 2015-01-10 ENCOUNTER — Other Ambulatory Visit: Payer: Self-pay

## 2015-01-10 ENCOUNTER — Telehealth: Payer: Self-pay | Admitting: Family Medicine

## 2015-01-10 MED ORDER — GABAPENTIN 300 MG PO CAPS: 300.0000 mg | ORAL_CAPSULE | Freq: Three times a day (TID) | ORAL | Status: DC

## 2015-01-10 NOTE — Telephone Encounter (Signed)
Okay to renew. Check with the pharmacy on the dosing regimen and give a six-month course

## 2015-01-10 NOTE — Telephone Encounter (Signed)
done

## 2015-01-10 NOTE — Telephone Encounter (Signed)
Requesting refill on Gabapentin. 

## 2015-01-27 ENCOUNTER — Other Ambulatory Visit (HOSPITAL_BASED_OUTPATIENT_CLINIC_OR_DEPARTMENT_OTHER): Payer: PRIVATE HEALTH INSURANCE

## 2015-01-27 ENCOUNTER — Other Ambulatory Visit (HOSPITAL_BASED_OUTPATIENT_CLINIC_OR_DEPARTMENT_OTHER): Payer: PRIVATE HEALTH INSURANCE | Admitting: *Deleted

## 2015-01-27 ENCOUNTER — Other Ambulatory Visit: Payer: Self-pay | Admitting: Medical Oncology

## 2015-01-27 ENCOUNTER — Ambulatory Visit (HOSPITAL_COMMUNITY): Payer: PRIVATE HEALTH INSURANCE

## 2015-01-27 DIAGNOSIS — C8590 Non-Hodgkin lymphoma, unspecified, unspecified site: Secondary | ICD-10-CM

## 2015-01-27 DIAGNOSIS — C8591 Non-Hodgkin lymphoma, unspecified, lymph nodes of head, face, and neck: Secondary | ICD-10-CM

## 2015-01-27 LAB — COMPREHENSIVE METABOLIC PANEL (CC13)
ALT: 34 U/L (ref 0–55)
AST: 50 U/L — ABNORMAL HIGH (ref 5–34)
Albumin: 4 g/dL (ref 3.5–5.0)
Alkaline Phosphatase: 41 U/L (ref 40–150)
Anion Gap: 12 mEq/L — ABNORMAL HIGH (ref 3–11)
BUN: 17.8 mg/dL (ref 7.0–26.0)
CO2: 25 mEq/L (ref 22–29)
Calcium: 9.6 mg/dL (ref 8.4–10.4)
Chloride: 105 mEq/L (ref 98–109)
Creatinine: 1.3 mg/dL (ref 0.7–1.3)
EGFR: 70 mL/min/{1.73_m2} — ABNORMAL LOW (ref >90)
Glucose: 92 mg/dl (ref 70–140)
Potassium: 4.5 mEq/L (ref 3.5–5.1)
Sodium: 141 mEq/L (ref 136–145)
Total Bilirubin: 0.54 mg/dL (ref 0.20–1.20)
Total Protein: 7.1 g/dL (ref 6.4–8.3)

## 2015-01-27 LAB — CBC WITH DIFFERENTIAL/PLATELET
BASO%: 1.5 % (ref 0.0–2.0)
Basophils Absolute: 0 10*3/uL (ref 0.0–0.1)
EOS%: 4.8 % (ref 0.0–7.0)
Eosinophils Absolute: 0.1 10*3/uL (ref 0.0–0.5)
HCT: 40.7 % (ref 38.4–49.9)
HGB: 12.4 g/dL — ABNORMAL LOW (ref 13.0–17.1)
LYMPH%: 20.3 % (ref 14.0–49.0)
MCH: 24.6 pg — ABNORMAL LOW (ref 27.2–33.4)
MCHC: 30.4 g/dL — ABNORMAL LOW (ref 32.0–36.0)
MCV: 80.9 fL (ref 79.3–98.0)
MONO#: 0.5 10*3/uL (ref 0.1–0.9)
MONO%: 16.4 % — ABNORMAL HIGH (ref 0.0–14.0)
NEUT#: 1.6 10*3/uL (ref 1.5–6.5)
NEUT%: 57 % (ref 39.0–75.0)
Platelets: 161 10*3/uL (ref 140–400)
RBC: 5.04 10*6/uL (ref 4.20–5.82)
RDW: 14.8 % — ABNORMAL HIGH (ref 11.0–14.6)
WBC: 2.8 10*3/uL — ABNORMAL LOW (ref 4.0–10.3)
lymph#: 0.6 10*3/uL — ABNORMAL LOW (ref 0.9–3.3)

## 2015-01-27 LAB — LACTATE DEHYDROGENASE (CC13): LDH: 238 U/L (ref 125–245)

## 2015-02-03 ENCOUNTER — Encounter: Payer: Self-pay | Admitting: Internal Medicine

## 2015-02-03 ENCOUNTER — Ambulatory Visit (HOSPITAL_BASED_OUTPATIENT_CLINIC_OR_DEPARTMENT_OTHER): Payer: PRIVATE HEALTH INSURANCE | Admitting: Internal Medicine

## 2015-02-03 ENCOUNTER — Telehealth: Payer: Self-pay | Admitting: Internal Medicine

## 2015-02-03 VITALS — BP 116/87 | HR 78 | Temp 97.8°F | Resp 19 | Ht 70.0 in | Wt 191.9 lb

## 2015-02-03 DIAGNOSIS — G622 Polyneuropathy due to other toxic agents: Secondary | ICD-10-CM | POA: Diagnosis not present

## 2015-02-03 DIAGNOSIS — C8330 Diffuse large B-cell lymphoma, unspecified site: Secondary | ICD-10-CM

## 2015-02-03 DIAGNOSIS — C8590 Non-Hodgkin lymphoma, unspecified, unspecified site: Secondary | ICD-10-CM

## 2015-02-03 NOTE — Telephone Encounter (Signed)
Gave avs & calendar for March 2017. °

## 2015-02-03 NOTE — Progress Notes (Signed)
[place] Telephone:([phone]   Fax:([phone]) [place], MD [place] [address]  DIAGNOSIS:  1) Stage II, large B-cell non-Hodgkin's lymphoma diagnosed in September 2012 with a scalp lesion. In addition he had mediastinal lymphadenopathy.  2) bilateral pulmonary embolism diagnosed on CT scan of the chest on 12/28/2012.   PRIOR THERAPY: Systemic chemotherapy with CHOP/Rituxan, given every 3 weeks with Neulasta support, status post 6 cycles, last dose was given 12/01/2011 with complete response.   CURRENT THERAPY: Xarelto 20 mg by mouth a [place].  INTERVAL HISTORY: Leonard Bentley 54 y.o. male returns to the clinic today for annual followup visit. The patient is feeling fine today with no specific complaints. He denied having any significant fatigue or weakness. He still has peripheral neuropathy in the feet. He denied having any weight loss or night sweats. He denied having any chest pain, shortness of breath, cough or hemoptysis. He has repeat CT scan of the head, chest, abdomen and pelvis performed recently and he is here for evaluation and discussion of his scan results.  MEDICAL HISTORY: Past Medical History  Diagnosis Date  . ED (erectile dysfunction)   . Pulmonary emboli   . Depression   . Cancer 07/2011    STAGE II NON- HODGKIN LYMPHOMA  . Lymphoma, non Hodgkin's 09/25/2011    ALLERGIES:  has No Known Allergies.  MEDICATIONS:  Current Outpatient Prescriptions  Medication Sig Dispense Refill  . Cyanocobalamin (VITAMIN B 12 PO) Take 1 tablet by mouth daily.    Marland Kitchen gabapentin (NEURONTIN) 300 MG capsule Take 1 capsule (300 mg total) by mouth 3 (three) times daily. 90 capsule 5  . Multiple Vitamin (MULTIVITAMIN WITH MINERALS) TABS Take 1 tablet by mouth daily. (Patient not taking: Reported on 11/20/2014) 30 tablet 0  . Vitamin D, Ergocalciferol, (DRISDOL) 50000 UNITS CAPS capsule Take 1 capsule  (50,000 Units total) by mouth every 7 (seven) days. 8 capsule 0   No current facility-administered medications for this visit.    SURGICAL HISTORY:  Past Surgical History  Procedure Laterality Date  . No past surgeries    . Trigger finger release Bilateral     REVIEW OF SYSTEMS:  A comprehensive review of systems was negative.   PHYSICAL EXAMINATION: General appearance: alert, cooperative and no distress Head: Normocephalic, without obvious abnormality, atraumatic Neck: no adenopathy Lymph nodes: Cervical, supraclavicular, and axillary nodes normal. Resp: clear to auscultation bilaterally Cardio: regular rate and rhythm, S1, S2 normal, no murmur, click, rub or gallop GI: soft, non-tender; bowel sounds normal; no masses,  no organomegaly Extremities: extremities normal, atraumatic, no cyanosis or edema  ECOG PERFORMANCE STATUS: 0 - Asymptomatic  Blood pressure 116/87, pulse 78, temperature 97.8 F (36.6 C), temperature source Oral, resp. rate 19, height 5\' 10"  (1.778 m), weight 191 lb 14.4 oz (87.045 kg), SpO2 100 %.  LABORATORY DATA: Lab Results  Component Value Date   WBC 2.8* 01/27/2015   HGB 12.4* 01/27/2015   HCT 40.7 01/27/2015   MCV 80.9 01/27/2015   PLT 161 01/27/2015      Chemistry      Component Value Date/Time   NA 141 01/27/2015 0927   NA 136 02/01/2013 0512   NA 137 06/30/2012 0754   K 4.5 01/27/2015 0927   K 4.5 02/01/2013 0512   K 3.8 06/30/2012 0754   CL 100 02/01/2013 0512   CL 98 12/28/2012 0824   CL 97* 06/30/2012 0754  CO2 25 01/27/2015 0927   CO2 28 02/01/2013 0512   CO2 30 06/30/2012 0754   BUN 17.8 01/27/2015 0927   BUN 12 02/01/2013 0512   BUN 14 06/30/2012 0754   CREATININE 1.3 01/27/2015 0927   CREATININE 0.82 02/01/2013 0512   CREATININE 1.0 06/30/2012 0754      Component Value Date/Time   CALCIUM 9.6 01/27/2015 0927   CALCIUM 8.6 02/01/2013 0512   CALCIUM 8.7 06/30/2012 0754   ALKPHOS 41 01/27/2015 0927   ALKPHOS 51  02/02/2013 0435   ALKPHOS 43 06/30/2012 0754   AST 50* 01/27/2015 0927   AST 162* 02/02/2013 0435   AST 62* 06/30/2012 0754   ALT 34 01/27/2015 0927   ALT 134* 02/02/2013 0435   ALT 39 06/30/2012 0754   BILITOT 0.54 01/27/2015 0927   BILITOT 0.9 02/02/2013 0435   BILITOT 1.60 06/30/2012 0754       RADIOGRAPHIC STUDIES: Ct Head [name]  01/08/2015   CLINICAL DATA:  Follow up non-Hodgkin's lymphoma. Chemotherapy completed in 12/2011. Numbness in the hands and feet.  EXAM: CT HEAD WITHOUT AND WITH CONTRAST  TECHNIQUE: Contiguous axial images were obtained from the base of the skull through the vertex without and with intravenous contrast  CONTRAST:  15mL OMNIPAQUE IOHEXOL 300 MG/ML  SOLN  COMPARISON:  01/16/2014  FINDINGS: There is mild generalized cerebral atrophy, unchanged. There is no evidence of acute cortical infarct, intracranial hemorrhage, mass, midline shift, or extra-axial fluid collection. Gray-white differentiation is preserved. No abnormal enhancement is identified.  Visualized orbits are unremarkable. Mastoid air cells and visualized paranasal sinuses are clear. No destructive skull lesions are identified.  IMPRESSION: No evidence of acute intracranial abnormality or mass.   Electronically Signed   By: Logan Bores   On: 01/08/2015 10:27   Ct Chest W Contrast  01/08/2015   CLINICAL DATA:  Followup non-Hodgkin's lymphoma  EXAM: CT CHEST, ABDOMEN, AND PELVIS WITH CONTRAST  TECHNIQUE: Multidetector CT imaging of the chest, abdomen and pelvis was performed following the standard protocol during bolus administration of intravenous contrast.  CONTRAST:  18mL OMNIPAQUE IOHEXOL 300 MG/ML  SOLN  COMPARISON:  01/16/2014.  FINDINGS: CT CHEST FINDINGS  Mediastinum: Normal heart size. No pericardial effusion. Chronic residual thrombus within the right lower lobe pulmonary artery is again noted, image 49/series 603. The trachea appears patent and midline. Normal appearance of the esophagus.  Index right paratracheal lymph node measures 1.8 cm, image 23/series 5. Previously 1.6 cm. Index pre-vascular lymph node is stable measuring 1.1 cm. The index right hilar lymph node measures 2.2 cm, image 26/ series 5. Previously 2.4 cm. The index sub- carinal lymph node measures 1.7 cm, image 28/series 5. Previously 1.6 cm. No axillary or supraclavicular adenopathy noted.  Lungs/Pleura: No pleural effusion identified. Small nonspecific nodule within the right upper lobe measuring 2 mm is unchanged, image 21/series 7. Stable subpleural nodule along the minor fissure measuring 4 mm, image 36/series 7.  Musculoskeletal: Review of the visualized bony structures is negative for aggressive lytic or sclerotic bone lesions.  CT ABDOMEN AND PELVIS FINDINGS  Hepatobiliary: Multifocal liver cysts are again noted and appear unchanged from the previous exam. The gallbladder appears normal. No biliary dilatation.  Pancreas: Normal appearance of the pancreas.  Spleen: The spleen is unremarkable.  Adrenals/Urinary Tract: The adrenal glands are both normal. Normal appearance of the kidneys.  Stomach/Bowel: The stomach is normal. The small bowel loops have a normal course and caliber. No obstruction. Normal appearance of the  appendix. The colon is unremarkable  Vascular/Lymphatic: Aortic tortuosity noted. Ectasia of bilateral iliac arteries. No enlarged retroperitoneal or mesenteric adenopathy. No enlarged pelvic or inguinal lymph nodes.  Reproductive: Normal appearance of the prostate gland and seminal vesicles.  Other: There is no ascites or focal fluid collections within the abdomen or pelvis.  Musculoskeletal: Review of visualized osseous structures is unremarkable. No aggressive lytic or sclerotic bone lesions.  IMPRESSION: 1. Similar appearance of thoracic adenopathy. 2. Stable small nonspecific nodules identified in the right lung. 3. No mass or adenopathy identified within the abdomen or pelvis.   Electronically Signed   By:  Kerby Moors M.D.   On: 01/08/2015 11:06   Ct Abdomen Pelvis W Contrast  01/08/2015   CLINICAL DATA:  Followup non-Hodgkin's lymphoma  EXAM: CT CHEST, ABDOMEN, AND PELVIS WITH CONTRAST  TECHNIQUE: Multidetector CT imaging of the chest, abdomen and pelvis was performed following the standard protocol during bolus administration of intravenous contrast.  CONTRAST:  184mL OMNIPAQUE IOHEXOL 300 MG/ML  SOLN  COMPARISON:  01/16/2014.  FINDINGS: CT CHEST FINDINGS  Mediastinum: Normal heart size. No pericardial effusion. Chronic residual thrombus within the right lower lobe pulmonary artery is again noted, image 49/series 603. The trachea appears patent and midline. Normal appearance of the esophagus. Index right paratracheal lymph node measures 1.8 cm, image 23/series 5. Previously 1.6 cm. Index pre-vascular lymph node is stable measuring 1.1 cm. The index right hilar lymph node measures 2.2 cm, image 26/ series 5. Previously 2.4 cm. The index sub- carinal lymph node measures 1.7 cm, image 28/series 5. Previously 1.6 cm. No axillary or supraclavicular adenopathy noted.  Lungs/Pleura: No pleural effusion identified. Small nonspecific nodule within the right upper lobe measuring 2 mm is unchanged, image 21/series 7. Stable subpleural nodule along the minor fissure measuring 4 mm, image 36/series 7.  Musculoskeletal: Review of the visualized bony structures is negative for aggressive lytic or sclerotic bone lesions.  CT ABDOMEN AND PELVIS FINDINGS  Hepatobiliary: Multifocal liver cysts are again noted and appear unchanged from the previous exam. The gallbladder appears normal. No biliary dilatation.  Pancreas: Normal appearance of the pancreas.  Spleen: The spleen is unremarkable.  Adrenals/Urinary Tract: The adrenal glands are both normal. Normal appearance of the kidneys.  Stomach/Bowel: The stomach is normal. The small bowel loops have a normal course and caliber. No obstruction. Normal appearance of the appendix. The  colon is unremarkable  Vascular/Lymphatic: Aortic tortuosity noted. Ectasia of bilateral iliac arteries. No enlarged retroperitoneal or mesenteric adenopathy. No enlarged pelvic or inguinal lymph nodes.  Reproductive: Normal appearance of the prostate gland and seminal vesicles.  Other: There is no ascites or focal fluid collections within the abdomen or pelvis.  Musculoskeletal: Review of visualized osseous structures is unremarkable. No aggressive lytic or sclerotic bone lesions.  IMPRESSION: 1. Similar appearance of thoracic adenopathy. 2. Stable small nonspecific nodules identified in the right lung. 3. No mass or adenopathy identified within the abdomen or pelvis.   Electronically Signed   By: Kerby Moors M.D.   On: 01/08/2015 11:06   ASSESSMENT AND PLAN: This is a very pleasant 54 years old [place] American male with history of stage II large B-cell non-Hodgkin lymphoma status post systemic chemotherapy with CHOP/Rituxan with complete response and has been observation since January of 2013 with no evidence for disease recurrence. I discussed the scan results with the patient today. I recommended for him to continue on observation with repeat CBC, comprehensive metabolic panel and LDH in 1 year.  I will consider him for repeat scan only of the patient has any concerning symptoms. He was advised to call immediately if he has any concerning symptoms in the interval.  The patient voices understanding of current disease status and treatment options and is in agreement with the current care plan.  All questions were answered. The patient knows to call the clinic with any problems, questions or concerns. We can certainly see the patient much sooner if necessary.  Disclaimer: This note was dictated with voice recognition software. Similar sounding words can inadvertently be transcribed and may be missed upon review.

## 2015-02-26 ENCOUNTER — Telehealth: Payer: Self-pay | Admitting: Family Medicine

## 2015-02-26 MED ORDER — TADALAFIL 20 MG PO TABS: 20.0000 mg | ORAL_TABLET | Freq: Every day | ORAL | Status: DC | PRN

## 2015-02-26 NOTE — Telephone Encounter (Signed)
Pt req refill CIALIS #8 to [facility] (diff pharm).   PT ph [phone]

## 2015-05-08 ENCOUNTER — Ambulatory Visit (INDEPENDENT_AMBULATORY_CARE_PROVIDER_SITE_OTHER): Payer: PRIVATE HEALTH INSURANCE | Admitting: Family Medicine

## 2015-05-08 ENCOUNTER — Encounter: Payer: Self-pay | Admitting: Family Medicine

## 2015-05-08 VITALS — BP 120/82 | Wt 189.0 lb

## 2015-05-08 DIAGNOSIS — E162 Hypoglycemia, unspecified: Secondary | ICD-10-CM

## 2015-05-08 DIAGNOSIS — T451X5A Adverse effect of antineoplastic and immunosuppressive drugs, initial encounter: Secondary | ICD-10-CM | POA: Diagnosis not present

## 2015-05-08 DIAGNOSIS — G620 Drug-induced polyneuropathy: Secondary | ICD-10-CM

## 2015-05-08 NOTE — Patient Instructions (Signed)
Call [employer] and asked them for help with finances. Sit properly when your texting Take the Lyrica

## 2015-05-08 NOTE — Progress Notes (Signed)
   Subjective:    Patient ID: Leonard Bentley, male    DOB: 1961/10/20, 54 y.o.   MRN: [id number]  HPI He is here for consult concerning multiple issues. He does note of the last 4 months that when he gets stressed, his speech becomes more difficult. He has difficulty expressing himself. When he takes himself out of stressful situations The problem goes away. Stress right now is mainly due to financial burden from his previous difficulty with lymphoma. He also describes intermittent weakness and dizziness as well as slight month nausea. This can occur throughout the [place]. He does note that eating does help this. He does not eat breakfast. He also is had difficulty with bilateral lower extremity numbness and tingling. He was told by his oncologist that this was a neuropathy. He was also seen by another physician and Lyrica was recommended however he has not started this medication. Then mentions something about tightness in his neck area when he leans forward while text thing.  Review of Systems     Objective:   Physical Exam Alert and in no distress otherwise not examined       Assessment & Plan:  Neuropathy due to chemotherapeutic drug  Hypoglycemia I strongly encouraged him to use the Lyrica to see if this will help with his symptoms. We discussed the stress that he is under. Recommended that he call [facility] to get their help with his financial issues. Also encouraged him to eat more regular meals as his weakness and dizziness does seem to be blood sugar related. Over 25 minutes, greater than 50% of this spent in coordination of care and counseling. I explained that the symptoms of the tightness is more related to muscle contraction and nothing to be concerned about. Strongly encouraged him to sit properly.

## 2015-06-26 ENCOUNTER — Telehealth: Payer: Self-pay | Admitting: Family Medicine

## 2015-06-26 NOTE — Telephone Encounter (Signed)
Pt will send labs over

## 2015-06-26 NOTE — Telephone Encounter (Signed)
Have him come in for a vitamin D level

## 2015-06-26 NOTE — Telephone Encounter (Signed)
Pt would like refill on vitamin D to [facility]

## 2015-07-26 ENCOUNTER — Emergency Department (HOSPITAL_COMMUNITY)
Admission: EM | Admit: 2015-07-26 | Discharge: 2015-07-26 | Disposition: A | Discharge status: Home / Self Care | Payer: No Typology Code available for payment source | Source: Home / Self Care | Attending: Family Medicine | Admitting: Family Medicine

## 2015-07-26 ENCOUNTER — Encounter (HOSPITAL_COMMUNITY): Payer: Self-pay

## 2015-07-26 DIAGNOSIS — K625 Hemorrhage of anus and rectum: Secondary | ICD-10-CM

## 2015-07-26 LAB — POCT I-STAT, CHEM 8
BUN: 17 mg/dL (ref 6–20)
Calcium, Ion: 1.18 mmol/L (ref 1.12–1.23)
Chloride: 99 mmol/L — ABNORMAL LOW (ref 101–111)
Creatinine, Ser: 1.1 mg/dL (ref 0.61–1.24)
Glucose, Bld: 101 mg/dL — ABNORMAL HIGH (ref 65–99)
HCT: 49 % (ref 39.0–52.0)
Hemoglobin: 16.7 g/dL (ref 13.0–17.0)
Potassium: 4.7 mmol/L (ref 3.5–5.1)
Sodium: 139 mmol/L (ref 135–145)
TCO2: 29 mmol/L (ref 0–100)

## 2015-07-26 NOTE — Discharge Instructions (Signed)
It is a pleasure to meet you today.    The lab work that we did today shows that your hemoglobin is 16.7 (not decreased).    I believe it is important for you to follow up with your family doctor, Dr. Redmond School, for the fatigue and low energy.  I believe the stressors you are encountering are playing a large role in this, and the increased alcohol intake is both a cause and a symptom of this.   As we discussed, reducing the amount of pressure placed on your forearms during the [place] and while you sleep, may be helpful in reducing the numbness in the fourth and fifth fingers of both hands.

## 2015-07-26 NOTE — ED Provider Notes (Addendum)
CSN: [id number]     Arrival date & time 07/26/15  1613 History   None    Chief Complaint  Patient presents with  . Dizziness   (Consider location/radiation/quality/duration/timing/severity/associated sxs/prior Treatment) Patient is a 54 y.o. male presenting with dizziness. The history is provided by the patient. No language interpreter was used.  Dizziness Associated symptoms: no chest pain   Patient presents with chief complaint of vague feeling of being unwell, tired during the [place] and with low energy.  Poor sleep, goes to bed at 10pm and cannot sleep. Worries about financial stressors. Awakens at 3am and cannot go back to sleep. No nocturia.  Usually exercises in morning, however this week has been unable to do this due to feeling tired.   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 Had another BM when he arrived in the [facility], was formed and without blood.  Denies pain with defecation.  Had BM earlier this morning that was formed and non-bloody.   Patient reports a history of lymphoma diagnosed in 2011, underwent chemotherapy until 2012.  Has maintained follow up with his hematologist.  Has had a history of neuropathy in hands and feet since that time, reports tingling in both arms/hands along 4th and 5th digits that persists.   Reports feeling "depressed", endorses loss of interest in things that used to bring him pleasure. More withdrawn.  Denies SI.   Social Hx; Formerly smoked cigarettes, quit 1-2 years ago.  Drinks 1 bottle of wine a [place]tagirone on average, however during recent stress has increased this somewhat.  No other drugs or substances.  He reports that he has not tried to stop alcohol, feels that he ought to try.  Works replacing glass during the week; security work on weekends.  During the week he works in a position that requires him to brace his arms on a metal bar while he works on the glass;  applies fair amount of pressure to forearms in the process.   ROS: No fevers or chills.   Past Medical History  Diagnosis Date  . ED (erectile dysfunction)   . Pulmonary emboli   . Depression   . Cancer 07/2011    STAGE II NON- HODGKIN LYMPHOMA  . Lymphoma, non Hodgkin's 09/25/2011   Past Surgical History  Procedure Laterality Date  . No past surgeries    . Trigger finger release Bilateral    Family History  Problem Relation Age of Onset  . Cancer Mother     BREAST(BONE)  . Cancer Father     PANCREATIC   Social History  Substance Use Topics  . Smoking status: Former Smoker -- 1.00 packs/[place] for 32 years    Types: Cigarettes    Quit date: 07/06/2013  . Smokeless tobacco: Never Used  . Alcohol Use: Yes     Comment: 5th a [place] of hard liquor    Review of Systems  Constitutional: Positive for activity change and fatigue. Negative for fever, chills, diaphoresis and appetite change.  HENT: Negative for congestion.   Respiratory: Negative for chest tightness.   Cardiovascular: Negative for chest pain.  Gastrointestinal: Positive for abdominal pain. Negative for abdominal distention.       Mild periumbilical abdominal pain today. Some mild nausea, no emesis.  Had screening colonoscopy with Dr Benson Norway 1-2 years ago and he reports he was instructed for repeat C-scope in 5 years due to polyp.  Genitourinary: Negative for dysuria, frequency, hematuria, flank pain, discharge and difficulty urinating.  Neurological: Positive for dizziness.  Psychiatric/Behavioral: Positive for dysphoric mood. Negative for hallucinations and decreased concentration.    Allergies  Review of patient's allergies indicates no known allergies.  Home Medications   Prior to Admission medications   Medication Sig Start Date End Date Taking? Authorizing Provider  Cyanocobalamin (VITAMIN B 12 PO) Take 1 tablet by mouth daily.    Historical Provider, MD  gabapentin (NEURONTIN) 300 MG capsule Take 1 capsule  (300 mg total) by mouth 3 (three) times daily. Patient not taking: Reported on 05/08/2015 01/10/15   [name], MD  Multiple Vitamin (MULTIVITAMIN WITH MINERALS) TABS Take 1 tablet by mouth daily. Patient not taking: Reported on 05/08/2015 02/02/13   Nishant Dhungel, MD  pregabalin (LYRICA) 100 MG capsule Take 100 mg by mouth 2 (two) times daily.    Historical Provider, MD  tadalafil (CIALIS) 20 MG tablet Take 1 tablet (20 mg total) by mouth daily as needed for erectile dysfunction. Patient not taking: Reported on 05/08/2015 02/26/15   [name], MD  Vitamin D, Ergocalciferol, (DRISDOL) 50000 UNITS CAPS capsule Take 1 capsule (50,000 Units total) by mouth every 7 (seven) days. Patient not taking: Reported on 05/08/2015 11/20/14   [name], MD   Meds Ordered and Administered this Visit  Medications - No data to display  BP 144/88 mmHg  Pulse 64  Temp(Src) 98.1 F (36.7 C) (Oral)  Resp 18  SpO2 97% No data found.   Physical Exam  Constitutional: He appears well-developed and well-nourished. No distress.  Appears with mild emotional lability during conversation about stressors. Pleasant and responds appropriately to questions.   Neck: Neck supple.  Cardiovascular: Normal rate, regular rhythm and normal heart sounds.   No murmur heard. Pulmonary/Chest: Effort normal and breath sounds normal. No respiratory distress. He has no wheezes. He has no rales. He exhibits no tenderness.  Abdominal: Soft. Bowel sounds are normal. He exhibits no distension and no mass. There is no tenderness. There is no rebound and no guarding.  Genitourinary:  DRE with no findings on inspection. No masses palpated on digital exam. No blood. Good sphincter tone. Prostate symmetric and not enlarged, nontender.   Lymphadenopathy:    He has no cervical adenopathy.  Neurological:  Full hand grip bilat. Decreased sensation ulnar distribution both hands.   Skin: Skin is warm and dry. He is not diaphoretic.   Psychiatric: His behavior is normal. Judgment and thought content normal.  Mildly depressed mood.     ED Course  Procedures (including critical care time)  Labs Review Labs Reviewed - No data to display  Imaging Review No results found.   Visual Acuity Review  Right Eye Distance:   Left Eye Distance:   Bilateral Distance:    Right Eye Near:   Left Eye Near:    Bilateral Near:         MDM   1. Bright red blood per rectum       Willeen Niece, MD 07/26/15 1847  Willeen Niece, MD 07/26/15 [phone]

## 2015-07-26 NOTE — ED Notes (Signed)
Patient complains of having a salty taste in his mouth Bouts of feeling dizzy Patient also states he has been having some bloody stools Patient does have a history of lymphoma that was diagnosed in 2011

## 2015-07-30 ENCOUNTER — Ambulatory Visit
Admission: RE | Admit: 2015-07-30 | Discharge: 2015-07-30 | Disposition: A | Discharge status: Home / Self Care | Payer: No Typology Code available for payment source | Source: Ambulatory Visit | Attending: Physician Assistant | Admitting: Physician Assistant

## 2015-07-30 ENCOUNTER — Other Ambulatory Visit: Payer: Self-pay | Admitting: Physician Assistant

## 2015-07-30 DIAGNOSIS — R202 Paresthesia of skin: Secondary | ICD-10-CM

## 2015-08-14 ENCOUNTER — Ambulatory Visit (INDEPENDENT_AMBULATORY_CARE_PROVIDER_SITE_OTHER): Payer: PRIVATE HEALTH INSURANCE | Admitting: Family Medicine

## 2015-08-14 ENCOUNTER — Encounter: Payer: Self-pay | Admitting: Family Medicine

## 2015-08-14 VITALS — Ht 70.0 in | Wt 192.0 lb

## 2015-08-14 DIAGNOSIS — M1990 Unspecified osteoarthritis, unspecified site: Secondary | ICD-10-CM | POA: Diagnosis not present

## 2015-08-14 NOTE — Progress Notes (Signed)
   Subjective:    Patient ID: Leonard Bentley, male    DOB: 1961-09-28, 54 y.o.   MRN: [id number]  HPI He is here for consultation. Apparently he has been having some neck and foot discomfort and did have recent x-rays of his back which did show evidence of some arthritic changes. He does run regularly. He does not complain of any distinct aches and pains other than generalized aching that does tend to get better as the [place] goes on. He was given diclofenac to help with his foot pain.   Review of Systems     Objective:   Physical Exam Alert and in no distress otherwise not examined       Assessment & Plan:  Arthritis  I reviewed the x-ray report and discussed arthritis care with him. Encouraged him to start with Tylenol and then proceed onto diclofenac or another NSAID. Discussed the fact that x-ray evidence of arthritis does not indicate clinically with the symptoms would be. Strongly encouraged him to continue with his range of motion and strengthening exercises.

## 2015-08-15 ENCOUNTER — Telehealth: Payer: Self-pay

## 2015-08-15 NOTE — Telephone Encounter (Signed)
You had called in Diclofenac for him a few days ago. He said he heard there was a gel from of this that he could like to try. If this is ok, he would like it sent to the [facility] on [address]

## 2015-08-15 NOTE — Telephone Encounter (Signed)
The gel is used only on one joint so the pill is better

## 2015-08-18 ENCOUNTER — Other Ambulatory Visit: Payer: Self-pay | Admitting: Family Medicine

## 2015-08-18 NOTE — Telephone Encounter (Signed)
Pt informed and verbalized understanding

## 2015-08-19 LAB — CMP12+LP+TP+TSH+6AC+PSA+CBC…
ALT: 47 IU/L — ABNORMAL HIGH (ref 0–44)
AST: 70 IU/L — ABNORMAL HIGH (ref 0–40)
Albumin/Globulin Ratio: 1.9 (ref 1.1–2.5)
Albumin: 4.5 g/dL (ref 3.5–5.5)
Alkaline Phosphatase: 36 IU/L — ABNORMAL LOW (ref 39–117)
BUN/Creatinine Ratio: 14 (ref 9–20)
BUN: 14 mg/dL (ref 6–24)
Basophils Absolute: 0.1 10*3/uL (ref 0.0–0.2)
Basos: 3 %
Bilirubin Total: 0.4 mg/dL (ref 0.0–1.2)
Calcium: 9.6 mg/dL (ref 8.7–10.2)
Chloride: 98 mmol/L (ref 97–108)
Chol/HDL Ratio: 1.9 ratio units (ref 0.0–5.0)
Cholesterol, Total: 238 mg/dL — ABNORMAL HIGH (ref 100–199)
Creatinine, Ser: 0.99 mg/dL (ref 0.76–1.27)
EOS (ABSOLUTE): 0.1 10*3/uL (ref 0.0–0.4)
Eos: 4 %
Estimated CHD Risk: <0.5 times avg. (ref 0.0–1.0)
Free Thyroxine Index: 1.5 (ref 1.2–4.9)
GFR calc Af Amer: 99 mL/min/{1.73_m2} (ref >59)
GFR calc non Af Amer: 85 mL/min/{1.73_m2} (ref >59)
GGT: 156 IU/L — ABNORMAL HIGH (ref 0–65)
Globulin, Total: 2.4 g/dL (ref 1.5–4.5)
Glucose: 92 mg/dL (ref 65–99)
HDL: 124 mg/dL (ref >39)
Hematocrit: 39.8 % (ref 37.5–51.0)
Hemoglobin: 12.3 g/dL — ABNORMAL LOW (ref 12.6–17.7)
Immature Grans (Abs): 0 10*3/uL (ref 0.0–0.1)
Immature Granulocytes: 0 %
Iron: 106 ug/dL (ref 38–169)
LDH: 272 IU/L — ABNORMAL HIGH (ref 121–224)
LDL Calculated: 94 mg/dL (ref 0–99)
Lymphocytes Absolute: 0.6 10*3/uL — ABNORMAL LOW (ref 0.7–3.1)
Lymphs: 27 %
MCH: 25.3 pg — ABNORMAL LOW (ref 26.6–33.0)
MCHC: 30.9 g/dL — ABNORMAL LOW (ref 31.5–35.7)
MCV: 82 fL (ref 79–97)
Monocytes Absolute: 0.4 10*3/uL (ref 0.1–0.9)
Monocytes: 15 %
Neutrophils Absolute: 1.2 10*3/uL — ABNORMAL LOW (ref 1.4–7.0)
Neutrophils: 51 %
Phosphorus: 3.7 mg/dL (ref 2.5–4.5)
Platelets: 162 10*3/uL (ref 150–379)
Potassium: 4.9 mmol/L (ref 3.5–5.2)
Prostate Specific Ag, Serum: 2.3 ng/mL (ref 0.0–4.0)
RBC: 4.87 x10E6/uL (ref 4.14–5.80)
RDW: 16.8 % — ABNORMAL HIGH (ref 12.3–15.4)
Sodium: 140 mmol/L (ref 134–144)
T3 Uptake Ratio: 36 % (ref 24–39)
T4, Total: 4.2 ug/dL — ABNORMAL LOW (ref 4.5–12.0)
TSH: 1.59 u[IU]/mL (ref 0.450–4.500)
Total Protein: 6.9 g/dL (ref 6.0–8.5)
Triglycerides: 102 mg/dL (ref 0–149)
Uric Acid: 7.4 mg/dL (ref 3.7–8.6)
VLDL Cholesterol Cal: 20 mg/dL (ref 5–40)
WBC: 2.3 10*3/uL — CL (ref 3.4–10.8)

## 2015-08-19 LAB — HGB A1C W/O EAG: Hgb A1c MFr Bld: 5.9 % — ABNORMAL HIGH (ref 4.8–5.6)

## 2015-08-21 ENCOUNTER — Telehealth: Payer: Self-pay | Admitting: Family Medicine

## 2015-08-21 NOTE — Telephone Encounter (Signed)
I did reviewed his chart and labs.   I didn't see anything urgent, so I placed in Dr. Redmond School' folder to review when he gets back.

## 2015-08-21 NOTE — Telephone Encounter (Signed)
Pt called questioning if he should be concerned about the labs that his job sent over to [place] for Dr Redmond School to review

## 2015-08-21 NOTE — Telephone Encounter (Signed)
Called pt and gave him message from [facility] regarding labs.

## 2015-08-25 ENCOUNTER — Telehealth: Payer: Self-pay

## 2015-08-25 NOTE — Telephone Encounter (Signed)
Left message on labs pt job sent over and Dr.Lalonde said yearly BS checks and Audelia Acton committed nothing urgent after reviewing chart

## 2015-09-04 ENCOUNTER — Telehealth: Payer: Self-pay | Admitting: Family Medicine

## 2015-09-04 MED ORDER — ALPRAZOLAM 0.25 MG PO TABS: 0.2500 mg | ORAL_TABLET | Freq: Two times a day (BID) | ORAL | Status: DC | PRN

## 2015-09-04 NOTE — Telephone Encounter (Signed)
Give him xanax .25mg  #12 one twice a [place] when necessary

## 2015-09-04 NOTE — Telephone Encounter (Signed)
Called med out to pharmacy for pt

## 2015-09-04 NOTE — Telephone Encounter (Signed)
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 Pt uses [facility] at [place].

## 2015-09-08 ENCOUNTER — Encounter: Payer: PRIVATE HEALTH INSURANCE | Admitting: Family Medicine

## 2015-09-15 ENCOUNTER — Ambulatory Visit (INDEPENDENT_AMBULATORY_CARE_PROVIDER_SITE_OTHER): Payer: PRIVATE HEALTH INSURANCE | Admitting: Family Medicine

## 2015-09-15 ENCOUNTER — Encounter: Payer: Self-pay | Admitting: Family Medicine

## 2015-09-15 VITALS — BP 120/70 | HR 60 | Ht 70.0 in | Wt 192.0 lb

## 2015-09-15 DIAGNOSIS — Z8572 Personal history of non-Hodgkin lymphomas: Secondary | ICD-10-CM

## 2015-09-15 DIAGNOSIS — I441 Atrioventricular block, second degree: Secondary | ICD-10-CM | POA: Diagnosis not present

## 2015-09-15 DIAGNOSIS — Z0000 Encounter for general adult medical examination without abnormal findings: Secondary | ICD-10-CM | POA: Diagnosis not present

## 2015-09-15 DIAGNOSIS — Z23 Encounter for immunization: Secondary | ICD-10-CM

## 2015-09-15 DIAGNOSIS — F17210 Nicotine dependence, cigarettes, uncomplicated: Secondary | ICD-10-CM

## 2015-09-15 DIAGNOSIS — M1990 Unspecified osteoarthritis, unspecified site: Secondary | ICD-10-CM | POA: Diagnosis not present

## 2015-09-15 DIAGNOSIS — Z720 Tobacco use: Secondary | ICD-10-CM

## 2015-09-15 DIAGNOSIS — Z1159 Encounter for screening for other viral diseases: Secondary | ICD-10-CM

## 2015-09-15 LAB — CBC WITH DIFFERENTIAL/PLATELET
Basophils Absolute: 0 10*3/uL (ref 0.0–0.1)
Basophils Relative: 1 % (ref 0–1)
Eosinophils Absolute: 0.1 10*3/uL (ref 0.0–0.7)
Eosinophils Relative: 2 % (ref 0–5)
HCT: 38.9 % — ABNORMAL LOW (ref 39.0–52.0)
Hemoglobin: 12.3 g/dL — ABNORMAL LOW (ref 13.0–17.0)
Lymphocytes Relative: 34 % (ref 12–46)
Lymphs Abs: 0.9 10*3/uL (ref 0.7–4.0)
MCH: 25.6 pg — ABNORMAL LOW (ref 26.0–34.0)
MCHC: 31.6 g/dL (ref 30.0–36.0)
MCV: 80.9 fL (ref 78.0–100.0)
MPV: 10.1 fL (ref 8.6–12.4)
Monocytes Absolute: 0.4 10*3/uL (ref 0.1–1.0)
Monocytes Relative: 15 % — ABNORMAL HIGH (ref 3–12)
Neutro Abs: 1.2 10*3/uL — ABNORMAL LOW (ref 1.7–7.7)
Neutrophils Relative %: 48 % (ref 43–77)
Platelets: 153 10*3/uL (ref 150–400)
RBC: 4.81 MIL/uL (ref 4.22–5.81)
RDW: 15.6 % — ABNORMAL HIGH (ref 11.5–15.5)
WBC: 2.6 10*3/uL — ABNORMAL LOW (ref 4.0–10.5)

## 2015-09-15 LAB — COMPREHENSIVE METABOLIC PANEL
ALT: 35 U/L (ref 9–46)
AST: 48 U/L — ABNORMAL HIGH (ref 10–35)
Albumin: 4.1 g/dL (ref 3.6–5.1)
Alkaline Phosphatase: 35 U/L — ABNORMAL LOW (ref 40–115)
BUN: 14 mg/dL (ref 7–25)
CO2: 28 mmol/L (ref 20–31)
Calcium: 8.9 mg/dL (ref 8.6–10.3)
Chloride: 99 mmol/L (ref 98–110)
Creat: 1.1 mg/dL (ref 0.70–1.33)
Glucose, Bld: 81 mg/dL (ref 65–99)
Potassium: 4.5 mmol/L (ref 3.5–5.3)
Sodium: 138 mmol/L (ref 135–146)
Total Bilirubin: 0.9 mg/dL (ref 0.2–1.2)
Total Protein: 6.6 g/dL (ref 6.1–8.1)

## 2015-09-15 LAB — LIPID PANEL
Cholesterol: 211 mg/dL — ABNORMAL HIGH (ref 125–200)
HDL: 125 mg/dL (ref >=40)
LDL Cholesterol: 75 mg/dL (ref <130)
Total CHOL/HDL Ratio: 1.7 Ratio (ref <=5.0)
Triglycerides: 54 mg/dL (ref <150)
VLDL: 11 mg/dL (ref <30)

## 2015-09-15 NOTE — Patient Instructions (Signed)
The next time you get anxious and when she use serenity prayer

## 2015-09-15 NOTE — Progress Notes (Signed)
Subjective:    Patient ID: Leonard Bentley, male    DOB: 08-12-1961, 54 y.o.   MRN: [id number]  HPI He is here for complete examination. Today his main concern is  his anxiety. He does have difficulty with crowds. He also has anxiety issues concerning work and paying bills. He's been using one Xanax roughly every other [place] for this. He also said some difficulty with minor aches and pains and does have a previous history of arthritis. He also has a previous history of non-Hodgkin's lymphoma. He has had chemotherapy for this and has had some difficulty with neuropathy however presently he is not on any medication. He is presently using E cig instead of smoking. He has had no difficulty with chest pain, shortness of breath, abdominal pain. Health maintenance, immunizations, social and family history was reviewed.   Review of Systems  All other systems reviewed and are negative.      Objective:   Physical Exam BP 120/70 mmHg  Pulse 60  Ht 5\' 10"  (1.778 m)  Wt 192 lb (87.091 kg)  BMI 27.55 kg/m2  SpO2 97%  General Appearance:    Alert, cooperative, no distress, appears stated age  Head:    Normocephalic, without obvious abnormality, atraumatic  Eyes:    PERRL, conjunctiva/corneas clear, EOM's intact, fundi    benign  Ears:    Normal TM's and external ear canals  Nose:   Nares normal, mucosa normal, no drainage or sinus   tenderness  Throat:   Lips, mucosa, and tongue normal; teeth  and gums normal  Neck:   Supple, no lymphadenopathy;  thyroid:  no   enlargement/tenderness/nodules; no carotid   bruit or JVD  Back:    Spine nontender, no curvature, ROM normal, no CVA     tenderness  Lungs:     Clear to auscultation bilaterally without wheezes, rales or     ronchi; respirations unlabored  Chest Wall:    No tenderness or defomity   Heart:   irregular rhythm is noted , no murmur, rub   or gallop  Breast Exam:    No chest wall tenderness, masses or gynecomastia  Abdomen:     Soft, non-tender,  nondistended, normoactive bowel sounds,    no masses, no hepatosplenomegaly  Genitalia:    Normal male external genitalia without lesions.  Testicles without masses.  No inguinal hernias.  Rectal:    Normal sphincter tone, no masses or tenderness; guaiac negative stool.  Prostate smooth, no nodules, not enlarged.  Extremities:   No clubbing, cyanosis or edema  Pulses:   2+ and symmetric all extremities  Skin:   Skin color, texture, turgor normal, no rashes or lesions  Lymph nodes:   Cervical, supraclavicular, and axillary nodes normal  Neurologic:   CNII-XII intact, normal strength, sensation and gait; reflexes 2+ and symmetric throughout          Psych:   Normal mood, affect, hygiene and grooming.    EKG shows second-degree block      Assessment & Plan:  Routine general medical examination at a health care facility - Plan: EKG 12-Lead, CBC with Differential/Platelet, Comprehensive metabolic panel, Lipid panel  Need for prophylactic vaccination with combined diphtheria-tetanus-pertussis (DTP) vaccine - Plan: Tdap vaccine greater than or equal to 7yo IM  Need for hepatitis C screening test - Plan: Hepatitis C Antibody  Arthritis  History of non-Hodgkin's lymphoma - Plan: CBC with Differential/Platelet, Comprehensive metabolic panel  Cigarette smoker  Second degree AV block,  Mobitz type I  I will discuss further evaluation of his EKG with cardiology. Presently he is having no other cardiac symptoms. I also discussed the stress that he is under and strongly encouraged him to reevaluate the stresses that he has placed himself under and apply the serenity prayer to it. I will continue to provide Xanax for him but monitor the situation closely

## 2015-09-16 LAB — HEPATITIS C ANTIBODY: HCV Ab: NEGATIVE

## 2015-09-18 ENCOUNTER — Other Ambulatory Visit: Payer: Self-pay

## 2015-09-18 DIAGNOSIS — R9431 Abnormal electrocardiogram [ECG] [EKG]: Secondary | ICD-10-CM

## 2015-10-08 NOTE — Progress Notes (Signed)
Cardiology Office Note   Date:  10/09/2015   ID:  Leonard Bentley, DOB 1961/02/04, MRN [id number]  PCP:  Wyatt Haste, MD  Cardiologist:   Sharol Harness, MD   Chief Complaint  Patient presents with  . New Evaluation    pt states no Sx.//abnormal EKG with Dr. Redmond School      History of Present Illness: Leonard Bentley is a 54 y.o. male with tobacco abuse and non-Hodgkin's lymphoma s/p chemotherapy who presents for and evaluation of second degree heart block.  [name] saw his PCP, [name], on 09/15/15.  At that appointment he was noted to have Mobitz I second degree heart block.  He was referred to cardiology for further evaluation.  [name] has been feeling well.  He runs several miles 5 days per week. He denies any chest pain or shortness of breath with this activity.  He denies lightheadedness, dizziness, lower extremity edema, orthopnea or PND. His only complaint is aches and pains from degenerative joint disease.  He has noticed that he is not quite as fast on his runs as he used to be. However he attributes this to decreasing his running regimen from daily to 5 days per week.     Past Medical History  Diagnosis Date  . ED (erectile dysfunction)   . Pulmonary emboli ([place])   . Depression   . Cancer ([place]) 07/2011    STAGE II NON- HODGKIN LYMPHOMA  . Lymphoma, non Hodgkin's 09/25/2011  . Mobitz I 10/09/2015  . PAC (premature atrial contraction) 10/09/2015    Past Surgical History  Procedure Laterality Date  . No past surgeries    . Trigger finger release Bilateral      Current Outpatient Prescriptions  Medication Sig Dispense Refill  . ALPRAZolam (XANAX) 0.25 MG tablet Take 1 tablet (0.25 mg total) by mouth 2 (two) times daily as needed for anxiety. 15 tablet 0  . DICLOFENAC SODIUM PO Take 1 tablet by mouth daily.     No current facility-administered medications for this visit.    Allergies:   Review of patient's allergies indicates no known allergies.     Social History:  The patient  reports that he quit smoking about 2 years ago. His smoking use included Cigarettes. He has a 32 pack-year smoking history. He has never used smokeless tobacco. He reports that he drinks alcohol. He reports that he does not use illicit drugs.   Family History:  The patient's family history includes Cancer in his father and mother.    ROS:  Please see the history of present illness.   Otherwise, review of systems are positive for none.   All other systems are reviewed and negative.    PHYSICAL EXAM: VS:  BP 132/90 mmHg  Pulse 64  Ht 5\' 8"  (1.727 m)  Wt 85.866 kg (189 lb 4.8 oz)  BMI 28.79 kg/m2 , BMI Body mass index is 28.79 kg/(m^2). GENERAL:  Well appearing HEENT:  Pupils equal round and reactive, fundi not visualized, oral mucosa unremarkable NECK:  No jugular venous distention, waveform within normal limits, carotid upstroke brisk and symmetric, no bruits, no thyromegaly LYMPHATICS:  No cervical adenopathy LUNGS:  Clear to auscultation bilaterally HEART:  RRR.  PMI not displaced or sustained,S1 and S2 within normal limits, no S3, no S4, no clicks, no rubs, no murmurs ABD:  Flat, positive bowel sounds normal in frequency in pitch, no bruits, no rebound, no guarding, no midline pulsatile mass, no hepatomegaly,  no splenomegaly EXT:  2 plus pulses throughout, no edema, no cyanosis no clubbing SKIN:  No rashes no nodules NEURO:  Cranial nerves II through XII grossly intact, motor grossly intact throughout PSYCH:  Cognitively intact, oriented to person place and time    EKG:  EKG is ordered today. The ekg ordered today demonstrates sinus rhythm rate 64 bpm. One PAC.LVH with repolarization abnormalities.LAFB. 09/15/15: sinus rhythm rate 87 bpm. PACs. Second degree heart block, Mobitz I.  LAFB  Recent Labs: 08/18/2015: TSH 1.590 09/15/2015: ALT 35; BUN 14; Creat 1.10; Hemoglobin 12.3*; Platelets 153; Potassium 4.5; Sodium 138    Lipid Panel     Component Value Date/Time   CHOL 211* 09/15/2015 0001   CHOL 238* 08/18/2015 0825   TRIG 54 09/15/2015 0001   HDL 125 09/15/2015 0001   HDL 124 08/18/2015 0825   CHOLHDL 1.7 09/15/2015 0001   CHOLHDL 1.9 08/18/2015 0825   VLDL 11 09/15/2015 0001   LDLCALC 75 09/15/2015 0001   LDLCALC 94 08/18/2015 0825      Wt Readings from Last 3 Encounters:  10/09/15 85.866 kg (189 lb 4.8 oz)  09/15/15 87.091 kg (192 lb)  08/14/15 87.091 kg (192 lb)      ASSESSMENT AND PLAN:  # Mobitz I/LAFB: [name] last EKG showed Mobitz I and LAFB (bifascicular block). He is asymptomatic and denies any lightheadedness or dizziness. He able to exercise at a high level without symptoms.  However he has noticed that his running times are not as good as they were in the past.  We will obtain an exercise Cardiolite to evaluate for ischemia. We will also check a TSH.  Assuming these are normal, we will follow him annually for his conduction system disease. I've advised him that if he develops any lightheadedness or dizziness should be evaluated by a medical professional, as this could be a sign of progressive conduction system disease.    # CV Disease Prevention: Mr. Byars was congratulated on his healthy lifestyle. His lipid panel is excellent.   Current medicines are reviewed at length with the patient today.  The patient does not have concerns regarding medicines.  The following changes have been made:  no change  Labs/ tests ordered today include:   Orders Placed This Encounter  Procedures  . TSH  . Myocardial Perfusion Imaging  . EKG 12-Lead     Disposition:   FU with Juliet Vasbinder C. Oval Linsey, MD, [facility] in 1 year    Signed, Ercole Georg C. Oval Linsey, MD, [facility]  10/09/2015 2:34 PM    [place]

## 2015-10-09 ENCOUNTER — Ambulatory Visit (INDEPENDENT_AMBULATORY_CARE_PROVIDER_SITE_OTHER): Payer: No Typology Code available for payment source | Admitting: Cardiovascular Disease

## 2015-10-09 ENCOUNTER — Encounter: Payer: Self-pay | Admitting: Cardiovascular Disease

## 2015-10-09 VITALS — BP 132/90 | HR 64 | Ht 68.0 in | Wt 189.3 lb

## 2015-10-09 DIAGNOSIS — I491 Atrial premature depolarization: Secondary | ICD-10-CM | POA: Diagnosis not present

## 2015-10-09 DIAGNOSIS — I441 Atrioventricular block, second degree: Secondary | ICD-10-CM | POA: Diagnosis not present

## 2015-10-09 DIAGNOSIS — R9431 Abnormal electrocardiogram [ECG] [EKG]: Secondary | ICD-10-CM | POA: Diagnosis not present

## 2015-10-09 HISTORY — DX: Atrial premature depolarization: I49.1

## 2015-10-09 HISTORY — DX: Atrioventricular block, second degree: I44.1

## 2015-10-09 NOTE — Patient Instructions (Signed)
Medication Instructions:  Your physician recommends that you continue on your current medications as directed. Please refer to the Current Medication list given to you today.  Labwork: NONE  Testing/Procedures: NONE  Follow-Up: Your physician wants you to follow-up in: 1 YEAR OV You will receive a reminder letter in the mail two months in advance. If you don't receive a letter, please call our office to schedule the follow-up appointment.  If you need a refill on your cardiac medications before your next appointment, please call your pharmacy.  

## 2015-10-10 ENCOUNTER — Telehealth: Payer: Self-pay

## 2015-10-10 ENCOUNTER — Other Ambulatory Visit: Payer: Self-pay | Admitting: Family Medicine

## 2015-10-10 NOTE — Telephone Encounter (Signed)
Pt called wanting refills on Diclofenac Sodium PO (no # or dosage given on last refill in chart) and Xanax 0.25mg  #15. He uses the [facility] on [address]

## 2015-10-11 ENCOUNTER — Other Ambulatory Visit: Payer: Self-pay | Admitting: Family Medicine

## 2015-10-11 LAB — T4, FREE: Free T4: 1 ng/dL (ref 0.82–1.77)

## 2015-10-11 LAB — TSH: TSH: 0.687 u[IU]/mL (ref 0.450–4.500)

## 2015-10-13 MED ORDER — DICLOFENAC SODIUM 75 MG PO TBEC: 75.0000 mg | DELAYED_RELEASE_TABLET | Freq: Two times a day (BID) | ORAL | Status: DC

## 2015-10-13 NOTE — Telephone Encounter (Signed)
Okay to renew

## 2015-10-14 ENCOUNTER — Telehealth (HOSPITAL_COMMUNITY): Payer: Self-pay

## 2015-10-14 ENCOUNTER — Telehealth: Payer: Self-pay | Admitting: *Deleted

## 2015-10-14 NOTE — Telephone Encounter (Signed)
Received labs on patient from 10/10/15 done by his employer  TSH 0.687 FT4 1.00  Will forward to Dr Oval Linsey for review

## 2015-10-14 NOTE — Telephone Encounter (Signed)
Normal thyroid function.  No need to repeat.

## 2015-10-14 NOTE — Telephone Encounter (Signed)
Called med into pharmacy

## 2015-10-14 NOTE — Telephone Encounter (Signed)
Encounter complete. 

## 2015-10-15 ENCOUNTER — Ambulatory Visit (HOSPITAL_COMMUNITY)
Admission: RE | Admit: 2015-10-15 | Discharge: 2015-10-15 | Disposition: A | Discharge status: Home / Self Care | Payer: No Typology Code available for payment source | Source: Ambulatory Visit | Attending: Cardiovascular Disease | Admitting: Cardiovascular Disease

## 2015-10-15 DIAGNOSIS — R9431 Abnormal electrocardiogram [ECG] [EKG]: Secondary | ICD-10-CM | POA: Diagnosis not present

## 2015-10-15 DIAGNOSIS — F17200 Nicotine dependence, unspecified, uncomplicated: Secondary | ICD-10-CM | POA: Diagnosis not present

## 2015-10-15 LAB — MYOCARDIAL PERFUSION IMAGING
Estimated workload: 9.3 METS
Exercise duration (min): 7 min
Exercise duration (sec): 31 s
LV dias vol: 161 mL
LV sys vol: 77 mL
MPHR: 165 {beats}/min
Peak HR: 166 {beats}/min
Percent HR: 100 %
RPE: 17
Rest HR: 66 {beats}/min
SDS: 2
SRS: 2
SSS: 4
TID: 1.05

## 2015-10-15 MED ORDER — TECHNETIUM TC 99M SESTAMIBI GENERIC - CARDIOLITE
31.6000 | Freq: Once | INTRAVENOUS | Status: AC | PRN
  Administered 2015-10-15: 31.6 via INTRAVENOUS

## 2015-10-15 MED ORDER — TECHNETIUM TC 99M SESTAMIBI GENERIC - CARDIOLITE
10.9000 | Freq: Once | INTRAVENOUS | Status: AC | PRN
  Administered 2015-10-15: 10.9 via INTRAVENOUS

## 2015-10-16 NOTE — Telephone Encounter (Signed)
Spoke to patient.  STRESS TESTResult given . Verbalized understanding PATIENT AWARE  TSH LAB IS NOT NEEDED.

## 2015-10-16 NOTE — Telephone Encounter (Signed)
-----   Message from [name], MD sent at 10/15/2015  5:53 PM EST ----- Normal stress test.

## 2015-12-09 ENCOUNTER — Ambulatory Visit (INDEPENDENT_AMBULATORY_CARE_PROVIDER_SITE_OTHER): Payer: PRIVATE HEALTH INSURANCE | Admitting: Family Medicine

## 2015-12-09 ENCOUNTER — Encounter: Payer: Self-pay | Admitting: Family Medicine

## 2015-12-09 VITALS — BP 132/90 | HR 68 | Wt 188.0 lb

## 2015-12-09 DIAGNOSIS — L723 Sebaceous cyst: Secondary | ICD-10-CM | POA: Diagnosis not present

## 2015-12-09 DIAGNOSIS — Z566 Other physical and mental strain related to work: Secondary | ICD-10-CM | POA: Diagnosis not present

## 2015-12-09 NOTE — Progress Notes (Signed)
   Subjective:    Patient ID: Leonard Bentley, male    DOB: June 02, 1961, 55 y.o.   MRN: [id number]  HPI Original reason for the exam was to evaluate lesions on his back that he thinks are going. He has a previous history of lymphoma. He then mentioned that he is under a lot of stress at work. After further discussion with him I find me then realized he was also seeing Lona Kettle in counseling and has seen him several times in the last year. At the end of the encounter he then brought up getting an FMLA because he apparently has missed a lot of work because of stress.   Review of Systems     Objective:   Physical Exam [place] and in no distress. Slightly raised round nondescript lesion is noted in his left upper back.       Assessment & Plan:  Sebaceous cyst  Work-related stress Explained that the lesion is most likely a cyst and if it gives him more trouble we can excise it but at this point I would leave it alone. Do not feel that it is indicative any major problem. The majority the encounter was spent in discussing stress and stress management. It wasn't until later into the discussion and he mentioned that he had been seeing Lona Kettle in counseling for this and it wasn't until the very end that he brought up getting an FMLA form filled out because of his being on probation due to missed work from stress. I explained that I could not fill out the FMLA without further consultation with Lona Kettle. Giving someone an FMLA for stress would be difficult if not impossible to do. Recommended he have Gerald Stabs call me so we can discuss this further. Over 25 minutes, greater than 50% spent in counseling and coordination of care.

## 2015-12-24 ENCOUNTER — Telehealth: Payer: Self-pay | Admitting: Family Medicine

## 2015-12-24 NOTE — Telephone Encounter (Signed)
Okay to refill both.

## 2015-12-24 NOTE — Telephone Encounter (Signed)
Pt called and requested Xanax and Dicolfenac to [facility] at [address]

## 2015-12-25 MED ORDER — ALPRAZOLAM 0.25 MG PO TABS: ORAL_TABLET | ORAL | Status: DC

## 2015-12-25 MED ORDER — DICLOFENAC SODIUM 75 MG PO TBEC: 75.0000 mg | DELAYED_RELEASE_TABLET | Freq: Two times a day (BID) | ORAL | Status: DC

## 2015-12-25 NOTE — Telephone Encounter (Signed)
Patient notified medication was sent to pharmacy.

## 2016-01-05 ENCOUNTER — Encounter: Payer: Self-pay | Admitting: Cardiovascular Disease

## 2016-01-05 ENCOUNTER — Ambulatory Visit (INDEPENDENT_AMBULATORY_CARE_PROVIDER_SITE_OTHER): Payer: No Typology Code available for payment source | Admitting: Cardiovascular Disease

## 2016-01-05 ENCOUNTER — Telehealth: Payer: Self-pay | Admitting: *Deleted

## 2016-01-05 VITALS — BP 128/88 | HR 60 | Ht 68.0 in | Wt 186.0 lb

## 2016-01-05 DIAGNOSIS — R0602 Shortness of breath: Secondary | ICD-10-CM | POA: Diagnosis not present

## 2016-01-05 DIAGNOSIS — I471 Supraventricular tachycardia: Secondary | ICD-10-CM | POA: Diagnosis not present

## 2016-01-05 DIAGNOSIS — R011 Cardiac murmur, unspecified: Secondary | ICD-10-CM

## 2016-01-05 MED ORDER — METOPROLOL TARTRATE 25 MG PO TABS: 12.5000 mg | ORAL_TABLET | Freq: Two times a day (BID) | ORAL | Status: DC

## 2016-01-05 NOTE — Patient Instructions (Addendum)
Medication Instructions: Dr Oval Linsey has recommended making the following medication changes: START Metoprolol Tartrate 25 mg - take 0.5 tablet (12.5 mg total) by mouth twice daily  >>A new prescription has been sent to your pharmacy electronically  Labwork: NONE  Testing/Procedures: 1. Echocardiogram - Your physician has requested that you have an echocardiogram. Echocardiography is a painless test that uses sound waves to create images of your heart. It provides your doctor with information about the size and shape of your heart and how well your heart's chambers and valves are working. This procedure takes approximately one hour. There are no restrictions for this procedure.  Follow-up: Dr Oval Linsey recommends that you schedule a follow-up appointment in 1 month.  If you need a refill on your cardiac medications before your next appointment, please call your pharmacy.

## 2016-01-05 NOTE — Telephone Encounter (Signed)
Pt went to pharmacy, notes he was supposed to pick up new Rx but not there when he arrived. Review of chart indicates pt to start on metoprolol tart 12.5mg  BID  Rx sent to [place] at [facility].

## 2016-01-05 NOTE — Progress Notes (Signed)
Cardiology Office Note   Date:  01/06/2016   ID:  Sonya Gim Shi, DOB 1960/12/31, MRN [id number]  PCP:  Wyatt Haste, MD  Cardiologist:   Sharol Harness, MD   Chief Complaint  Patient presents with  . Follow-up    no chest pain, some shortness of breath, no swelling, increased numbness in limbs, cramping in AM, some dizziness & lightheadedness      Patient ID: Leonard Bentley is a 55 y.o. male with tobacco abuse and non-Hodgkin's lymphoma s/p chemotherapy who presents for and evaluation of second degree heart block.    Interval History 01/05/16: After his last appointment  Leonard Bentley underwent exercise Cardiolite that revealed LVEF 52% and no ischemia.  Leonard Bentley reports feeling fatigued lately.  He notes that he is not able to exercise or push himself as much as he used to.  Some days he feels tired for the entire [place] and other days he feels OK.  He denies chest pain but does sometimes feel short of breath with exertion.  He noticed this when walking up stairs recently.  He typically would not have any trouble with this level of exertion.   He also noted some pain in his legs when walking up the stairs.  He has occasionally noted some lightheadedness and dizziness.  He denies lower extremity edema, orthopnea or PND.  History of Present Illness 10/09/15: Leonard Bentley saw his PCP, [name], on 09/15/15.  At that appointment he was noted to have Mobitz I second degree heart block.  He was referred to cardiology for further evaluation.  Leonard Bentley has been feeling well.  He runs several miles 5 days per week. He denies any chest pain or shortness of breath with this activity.  He denies lightheadedness, dizziness, lower extremity edema, orthopnea or PND. His only complaint is aches and pains from degenerative joint disease.  He has noticed that he is not quite as fast on his runs as he used to be. However he attributes this to decreasing his running regimen from daily to 5 days per week.      Past Medical History  Diagnosis Date  . ED (erectile dysfunction)   . Pulmonary emboli ([place])   . Depression   . Cancer ([place]) 07/2011    STAGE II NON- HODGKIN LYMPHOMA  . Lymphoma, non Hodgkin's 09/25/2011  . Mobitz I 10/09/2015  . PAC (premature atrial contraction) 10/09/2015  . Atrial tachycardia ([place]) 01/06/2016  . Murmur 01/06/2016    Past Surgical History  Procedure Laterality Date  . No past surgeries    . Trigger finger release Bilateral      Current Outpatient Prescriptions  Medication Sig Dispense Refill  . ALPRAZolam (XANAX) 0.25 MG tablet TAKE ONE TABLET BY MOUTH TWICE DAILY AS NEEDED FOR ANXIETY. 15 tablet 0  . diclofenac (VOLTAREN) 75 MG EC tablet Take 1 tablet (75 mg total) by mouth 2 (two) times daily. 30 tablet 0  . metoprolol tartrate (LOPRESSOR) 25 MG tablet Take 0.5 tablets (12.5 mg total) by mouth 2 (two) times daily. 60 tablet 3   No current facility-administered medications for this visit.    Allergies:   Review of patient's allergies indicates no known allergies.    Social History:  The patient  reports that he quit smoking about 2 years ago. His smoking use included Cigarettes. He has a 32 pack-year smoking history. He uses smokeless tobacco. He reports that he drinks alcohol. He reports that he does  not use illicit drugs.   Family History:  The patient's family history includes Cancer in his father and mother.    ROS:  Please see the history of present illness.   Otherwise, review of systems are positive for none.   All other systems are reviewed and negative.    PHYSICAL EXAM: VS:  BP 128/88 mmHg  Pulse 60  Ht 5\' 8"  (1.727 m)  Wt 84.369 kg (186 lb)  BMI 28.29 kg/m2 , BMI Body mass index is 28.29 kg/(m^2). GENERAL:  Well appearing HEENT:  Pupils equal round and reactive, fundi not visualized, oral mucosa unremarkable NECK:  No jugular venous distention, waveform within normal limits, carotid upstroke brisk and symmetric, no bruits, no  thyromegaly LYMPHATICS:  No cervical adenopathy LUNGS:  Clear to auscultation bilaterally HEART:  RRR.  PMI not displaced or sustained,S1 and S2 within normal limits, no S3, no S4, no clicks, no rubs, II/VI holosystolic murmur at the apex ABD:  Flat, positive bowel sounds normal in frequency in pitch, no bruits, no rebound, no guarding, no midline pulsatile mass, no hepatomegaly, no splenomegaly EXT:  2 plus pulses throughout, no edema, no cyanosis no clubbing SKIN:  No rashes no nodules NEURO:  Cranial nerves II through XII grossly intact, motor grossly intact throughout PSYCH:  Cognitively intact, oriented to person place and time   EKG:  EKG is ordered today. The ekg ordered today demonstrates sinus rhythm rate ~50 bpm, PAT rate ~100 bpm.  LAFB.  09/15/15: sinus rhythm rate 87 bpm. PACs. Second degree heart block, Mobitz I.  LAFB  Exercise Cardiolite 10/15/15:  Nuclear stress EF: 52%.  There was no ST segment deviation noted during stress.  No T wave inversion was noted during stress.  The study is normal.  This is a low risk study.  Low risk stress nuclear study with normal perfusion and normal left ventricular regional systolic function. Borderline global LVEF 52%.  Recent Labs: 09/15/2015: ALT 35; BUN 14; Creat 1.10; Hemoglobin 12.3*; Platelets 153; Potassium 4.5; Sodium 138 10/10/2015: TSH 0.687    Lipid Panel    Component Value Date/Time   CHOL 211* 09/15/2015 0001   CHOL 238* 08/18/2015 0825   TRIG 54 09/15/2015 0001   HDL 125 09/15/2015 0001   HDL 124 08/18/2015 0825   CHOLHDL 1.7 09/15/2015 0001   CHOLHDL 1.9 08/18/2015 0825   VLDL 11 09/15/2015 0001   LDLCALC 75 09/15/2015 0001   LDLCALC 94 08/18/2015 0825      Wt Readings from Last 3 Encounters:  01/05/16 84.369 kg (186 lb)  12/09/15 85.276 kg (188 lb)  10/15/15 85.73 kg (189 lb)      ASSESSMENT AND PLAN:  # PAT, PACs: In retrospect it appears that Leonard Bentley last EKG may actually show multiple  non-conducted P waves from PACs rather than Mobitz I.  Today he has a rhythm strip that clearly shows sinus rhythm with multiple runs of PAT and frequent PACs.  Given that he reports dizziness and exertional dyspnea, we will try metoprolol 12.5 mg q12h.    # Fatigue: Leonard Bentley had a stress test that was negative for ischemia, normal thyroid function and BMP.  His CBC revealed mild anemia and mild leukocytosis.  He did not have chronotropic incompetence.  It is possible that his arrhythmia is contributing to these symptoms.  We are starting metoprolol as above.   # Murmur: Leonard Bentley has a systolic murmur on exam that is consistent with mitral regurgitation.  We will obtain  an echo to better assess.  He does not have heart failure on exam.   Current medicines are reviewed at length with the patient today.  The patient does not have concerns regarding medicines.  The following changes have been made:  Start metoprolol 12.5 mg bid  Labs/ tests ordered today include:   Orders Placed This Encounter  Procedures  . EKG 12-Lead  . ECHOCARDIOGRAM COMPLETE     Disposition:   FU with Brave Dack C. Oval Linsey, MD, [facility] in 1 month   Signed, Blanchie Zeleznik C. Oval Linsey, MD, [facility]  01/06/2016 8:02 AM    [place]

## 2016-01-06 ENCOUNTER — Encounter: Payer: Self-pay | Admitting: Cardiovascular Disease

## 2016-01-06 DIAGNOSIS — I471 Supraventricular tachycardia: Secondary | ICD-10-CM

## 2016-01-06 DIAGNOSIS — I4719 Other supraventricular tachycardia: Secondary | ICD-10-CM

## 2016-01-06 DIAGNOSIS — R011 Cardiac murmur, unspecified: Secondary | ICD-10-CM

## 2016-01-06 HISTORY — DX: Cardiac murmur, unspecified: R01.1

## 2016-01-06 HISTORY — DX: Other supraventricular tachycardia: I47.19

## 2016-01-06 HISTORY — DX: Supraventricular tachycardia: I47.1

## 2016-01-20 ENCOUNTER — Other Ambulatory Visit (HOSPITAL_COMMUNITY): Payer: No Typology Code available for payment source

## 2016-01-20 ENCOUNTER — Other Ambulatory Visit: Payer: Self-pay

## 2016-01-20 ENCOUNTER — Ambulatory Visit (HOSPITAL_COMMUNITY): Payer: No Typology Code available for payment source | Attending: Internal Medicine

## 2016-01-20 DIAGNOSIS — I071 Rheumatic tricuspid insufficiency: Secondary | ICD-10-CM | POA: Diagnosis not present

## 2016-01-20 DIAGNOSIS — R011 Cardiac murmur, unspecified: Secondary | ICD-10-CM | POA: Diagnosis not present

## 2016-01-20 DIAGNOSIS — I340 Nonrheumatic mitral (valve) insufficiency: Secondary | ICD-10-CM | POA: Insufficient documentation

## 2016-01-20 DIAGNOSIS — I358 Other nonrheumatic aortic valve disorders: Secondary | ICD-10-CM | POA: Diagnosis not present

## 2016-01-20 DIAGNOSIS — Z720 Tobacco use: Secondary | ICD-10-CM | POA: Diagnosis not present

## 2016-01-20 DIAGNOSIS — I517 Cardiomegaly: Secondary | ICD-10-CM | POA: Diagnosis not present

## 2016-01-20 DIAGNOSIS — I341 Nonrheumatic mitral (valve) prolapse: Secondary | ICD-10-CM | POA: Diagnosis not present

## 2016-01-20 DIAGNOSIS — R0602 Shortness of breath: Secondary | ICD-10-CM | POA: Diagnosis not present

## 2016-01-20 DIAGNOSIS — I351 Nonrheumatic aortic (valve) insufficiency: Secondary | ICD-10-CM | POA: Insufficient documentation

## 2016-01-20 DIAGNOSIS — R0600 Dyspnea, unspecified: Secondary | ICD-10-CM | POA: Diagnosis present

## 2016-01-20 HISTORY — DX: Nonrheumatic mitral (valve) insufficiency: I34.0

## 2016-01-23 ENCOUNTER — Telehealth: Payer: Self-pay | Admitting: Family Medicine

## 2016-01-23 MED ORDER — DICLOFENAC SODIUM 75 MG PO TBEC: 75.0000 mg | DELAYED_RELEASE_TABLET | Freq: Two times a day (BID) | ORAL | Status: DC

## 2016-01-23 NOTE — Telephone Encounter (Signed)
Let Him know that I called it in

## 2016-01-23 NOTE — Telephone Encounter (Signed)
Pt called requesting a refill on his rx voltaren pt uses [facility] [place], [place] - [address] pt would like a call after it is sent to the pharmacy since it was so late in the [place] pt can be reached at  [phone] (H) did tell pt to check with the pharmacy late in the [place] to see if it is there.

## 2016-01-23 NOTE — Telephone Encounter (Signed)
Dr. Redmond School this was sent to cheri's basket as it needed to go to you for a refill?

## 2016-01-26 NOTE — Telephone Encounter (Signed)
Pt has already picked up

## 2016-01-29 ENCOUNTER — Telehealth: Payer: Self-pay | Admitting: Cardiovascular Disease

## 2016-01-29 NOTE — Telephone Encounter (Signed)
Advised of results, has ov next week

## 2016-01-29 NOTE — Telephone Encounter (Signed)
-----   Message from [name], MD sent at 01/23/2016 10:52 AM EDT ----- Echo shows that there is significant leaking of his mitral valve.  This is likely the cause of his shortness of breath and why he has a harder time exercising.  Please schedule follow up to discuss.

## 2016-01-29 NOTE — Telephone Encounter (Signed)
F/u  Pt returning RN phone call- echo results. Please call back and discuss.   

## 2016-02-02 ENCOUNTER — Other Ambulatory Visit (HOSPITAL_BASED_OUTPATIENT_CLINIC_OR_DEPARTMENT_OTHER): Payer: No Typology Code available for payment source

## 2016-02-02 ENCOUNTER — Telehealth: Payer: Self-pay | Admitting: Internal Medicine

## 2016-02-02 ENCOUNTER — Ambulatory Visit (HOSPITAL_BASED_OUTPATIENT_CLINIC_OR_DEPARTMENT_OTHER): Payer: No Typology Code available for payment source | Admitting: Internal Medicine

## 2016-02-02 ENCOUNTER — Encounter: Payer: Self-pay | Admitting: Internal Medicine

## 2016-02-02 VITALS — BP 110/72 | HR 66 | Temp 97.9°F | Resp 18 | Ht 68.0 in | Wt 187.2 lb

## 2016-02-02 DIAGNOSIS — D709 Neutropenia, unspecified: Secondary | ICD-10-CM | POA: Diagnosis not present

## 2016-02-02 DIAGNOSIS — D72819 Decreased white blood cell count, unspecified: Secondary | ICD-10-CM

## 2016-02-02 DIAGNOSIS — Z8572 Personal history of non-Hodgkin lymphomas: Secondary | ICD-10-CM

## 2016-02-02 DIAGNOSIS — C8590 Non-Hodgkin lymphoma, unspecified, unspecified site: Secondary | ICD-10-CM

## 2016-02-02 DIAGNOSIS — D630 Anemia in neoplastic disease: Secondary | ICD-10-CM

## 2016-02-02 LAB — CBC WITH DIFFERENTIAL/PLATELET
BASO%: 2.3 % — ABNORMAL HIGH (ref 0.0–2.0)
Basophils Absolute: 0 10*3/uL (ref 0.0–0.1)
EOS%: 6.6 % (ref 0.0–7.0)
Eosinophils Absolute: 0.1 10*3/uL (ref 0.0–0.5)
HCT: 37.2 % — ABNORMAL LOW (ref 38.4–49.9)
HGB: 11.7 g/dL — ABNORMAL LOW (ref 13.0–17.1)
LYMPH%: 17.4 % (ref 14.0–49.0)
MCH: 26 pg — ABNORMAL LOW (ref 27.2–33.4)
MCHC: 31.3 g/dL — ABNORMAL LOW (ref 32.0–36.0)
MCV: 82.8 fL (ref 79.3–98.0)
MONO#: 0.4 10*3/uL (ref 0.1–0.9)
MONO%: 16.8 % — ABNORMAL HIGH (ref 0.0–14.0)
NEUT#: 1.2 10*3/uL — ABNORMAL LOW (ref 1.5–6.5)
NEUT%: 56.9 % (ref 39.0–75.0)
Platelets: 141 10*3/uL (ref 140–400)
RBC: 4.49 10*6/uL (ref 4.20–5.82)
RDW: 15.4 % — ABNORMAL HIGH (ref 11.0–14.6)
WBC: 2.1 10*3/uL — ABNORMAL LOW (ref 4.0–10.3)
lymph#: 0.4 10*3/uL — ABNORMAL LOW (ref 0.9–3.3)

## 2016-02-02 LAB — COMPREHENSIVE METABOLIC PANEL
ALT: 40 U/L (ref 0–55)
AST: 53 U/L — ABNORMAL HIGH (ref 5–34)
Albumin: 3.7 g/dL (ref 3.5–5.0)
Alkaline Phosphatase: 34 U/L — ABNORMAL LOW (ref 40–150)
Anion Gap: 9 mEq/L (ref 3–11)
BUN: 16 mg/dL (ref 7.0–26.0)
CO2: 26 mEq/L (ref 22–29)
Calcium: 8.9 mg/dL (ref 8.4–10.4)
Chloride: 107 mEq/L (ref 98–109)
Creatinine: 1.1 mg/dL (ref 0.7–1.3)
EGFR: 87 mL/min/{1.73_m2} — ABNORMAL LOW (ref >90)
Glucose: 76 mg/dl (ref 70–140)
Potassium: 4.5 mEq/L (ref 3.5–5.1)
Sodium: 142 mEq/L (ref 136–145)
Total Bilirubin: 0.64 mg/dL (ref 0.20–1.20)
Total Protein: 6.7 g/dL (ref 6.4–8.3)

## 2016-02-02 LAB — LACTATE DEHYDROGENASE: LDH: 228 U/L (ref 125–245)

## 2016-02-02 NOTE — Progress Notes (Signed)
Cardiology Office Note   Date:  02/03/2016   ID:  Leonard Bentley, DOB 05/09/1961, MRN [id number]  PCP:  Leonard Haste, MD  Cardiologist:   Leonard Harness, MD   Chief Complaint  Patient presents with  . Follow-up    weak after exercise, feels Metoprolol helping       Patient ID: Leonard Bentley is a 55 y.o. male with tobacco abuse and non-Hodgkin's lymphoma s/p chemotherapy who presents for and evaluation of second degree heart block.    Interval History 02/04/16: At the last appointment Leonard Bentley was started on metoprolol 12.5 mg twice a [place] for symptomatic, non-conducted PACs.  He continues to have some palpitations but thinks that they are significantly improved. They have been less frequent and the duration is shorter since starting metoprolol.  After his last appointment Leonard Bentley had an echo that showed prolapse of the posterior leaflet of the mitral valve with moderate to severe mitral regurgitation.  LVEF was 60-65% and his left ventricle was not dilated.  His left atrium was massively enlarged.  Pulmonary pressures were normal.   He continues to run for exercise and thinks that his endurance is slightly improved since starting metoprolol. However he is not back to baseline. He denies any lower extremity edema, orthopnea, or PND. He has not noted any chest pain or pressure.   Interval History 01/05/16: After his last appointment  Leonard Bentley underwent exercise Cardiolite that revealed LVEF 52% and no ischemia.  Leonard Bentley reports feeling fatigued lately.  He notes that he is not able to exercise or push himself as much as he used to.  Some days he feels tired for the entire [place] and other days he feels OK.  He denies chest pain but does sometimes feel short of breath with exertion.  He noticed this when walking up stairs recently.  He typically would not have any trouble with this level of exertion.   He also noted some pain in his legs when walking up the stairs.  He has occasionally noted  some lightheadedness and dizziness.  He denies lower extremity edema, orthopnea or PND.  History of Present Illness 10/09/15: Leonard Bentley saw his PCP, Leonard Bentley, on 09/15/15.  At that appointment he was noted to have Mobitz I second degree heart block.  He was referred to cardiology for further evaluation.  Leonard Bentley has been feeling well.  He runs several miles 5 days per week. He denies any chest pain or shortness of breath with this activity.  He denies lightheadedness, dizziness, lower extremity edema, orthopnea or PND. His only complaint is aches and pains from degenerative joint disease.  He has noticed that he is not quite as fast on his runs as he used to be. However he attributes this to decreasing his running regimen from daily to 5 days per week.     Past Medical History  Diagnosis Date  . ED (erectile dysfunction)   . Pulmonary emboli ([place])   . Depression   . Cancer ([place]) 07/2011    STAGE II NON- HODGKIN LYMPHOMA  . Lymphoma, non Hodgkin's 09/25/2011  . Mobitz I 10/09/2015  . PAC (premature atrial contraction) 10/09/2015  . Atrial tachycardia ([place]) 01/06/2016  . Murmur 01/06/2016    Past Surgical History  Procedure Laterality Date  . No past surgeries    . Trigger finger release Bilateral      Current Outpatient Prescriptions  Medication Sig Dispense Refill  . ALPRAZolam (  XANAX) 0.25 MG tablet TAKE ONE TABLET BY MOUTH TWICE DAILY AS NEEDED FOR ANXIETY. 15 tablet 0  . metoprolol tartrate (LOPRESSOR) 25 MG tablet Take 0.5 tablets (12.5 mg total) by mouth 2 (two) times daily. 60 tablet 3   No current facility-administered medications for this visit.    Allergies:   Review of patient's allergies indicates no known allergies.    Social History:  The patient  reports that he quit smoking about 2 years ago. His smoking use included Cigarettes. He has a 32 pack-year smoking history. He uses smokeless tobacco. He reports that he drinks alcohol. He reports that he does not use  illicit drugs.   Family History:  The patient's family history includes Cancer in his father and mother.    ROS:  Please see the history of present illness.   Otherwise, review of systems are positive for none.   All other systems are reviewed and negative.    PHYSICAL EXAM: VS:  BP 112/72 mmHg  Pulse 68  Ht 5\' 9"  (1.753 m)  Wt 84.641 kg (186 lb 9.6 oz)  BMI 27.54 kg/m2 , BMI Body mass index is 27.54 kg/(m^2). GENERAL:  Well appearing HEENT:  Pupils equal round and reactive, fundi not visualized, oral mucosa unremarkable NECK:  No jugular venous distention, waveform within normal limits, carotid upstroke brisk and symmetric, no bruits LYMPHATICS:  No cervical adenopathy LUNGS:  Clear to auscultation bilaterally HEART:  RRR.  PMI not displaced or sustained,S1 and S2 within normal limits, no S3, no S4, no clicks, no rubs, II/VI holosystolic murmur at the apex ABD:  Flat, positive bowel sounds normal in frequency in pitch, no bruits, no rebound, no guarding, no midline pulsatile mass, no hepatomegaly, no splenomegaly EXT:  2 plus pulses throughout, no edema, no cyanosis no clubbing SKIN:  No rashes no nodules NEURO:  Cranial nerves II through XII grossly intact, motor grossly intact throughout PSYCH:  Cognitively intact, oriented to person place and time   EKG:  EKG is not ordered today.  09/15/15: sinus rhythm rate 87 bpm. PACs. Second degree heart block, Mobitz I.  LAFB  Exercise Cardiolite 10/15/15:  Nuclear stress EF: 52%.  There was no ST segment deviation noted during stress.  No T wave inversion was noted during stress.  The study is normal.  This is a low risk study.  Low risk stress nuclear study with normal perfusion and normal left ventricular regional systolic function. Borderline global LVEF 52%.  Echo 01/20/16: Study Conclusions  - Left ventricle: The cavity size was normal. Wall thickness was  increased in a pattern of moderate LVH. Systolic function  was  normal. The estimated ejection fraction was in the range of 60%  to 65%. Wall motion was normal; there were no regional wall  motion abnormalities. The study is not technically sufficient to  allow evaluation of LV diastolic function. - Aortic valve: Sclerosis without stenosis. There was trivial  regurgitation. - Mitral valve: Mildly thickened leaflets with predominate prolapse  of the posterior leaflet and anteriorly directed mitral  regurgitation. There was moderate to severe eccentric  regurgitation directed at the atrial septum. - Left atrium: Massively dilated at 85 ml/m2. - Tricuspid valve: There was mild regurgitation. - Pulmonary arteries: PA peak pressure: 27 mm Hg (S). - Inferior vena cava: The vessel was normal in size. The  respirophasic diameter changes were in the normal range (>= 50%),  consistent with normal central venous pressure.  Impressions:  - LVEF 60-65%, moderate LVH,  trivial AI, moderate to more likely  severe eccentric MR directed anteriorly and to the interatrial  septum, posterior mitral valve leaflet prolpase, massive LAE,  mild TR, RVSP 27 mmHg, normal IVC.   Recent Labs: 10/10/2015: TSH 0.687 02/02/2016: ALT 40; BUN 16.0; Creatinine 1.1; HGB 11.7*; Platelets 141; Potassium 4.5; Sodium 142    Lipid Panel    Component Value Date/Time   CHOL 211* 09/15/2015 0001   CHOL 238* 08/18/2015 0825   TRIG 54 09/15/2015 0001   HDL 125 09/15/2015 0001   HDL 124 08/18/2015 0825   CHOLHDL 1.7 09/15/2015 0001   CHOLHDL 1.9 08/18/2015 0825   VLDL 11 09/15/2015 0001   LDLCALC 75 09/15/2015 0001   LDLCALC 94 08/18/2015 0825      Wt Readings from Last 3 Encounters:  02/03/16 84.641 kg (186 lb 9.6 oz)  02/02/16 84.913 kg (187 lb 3.2 oz)  01/05/16 84.369 kg (186 lb)      ASSESSMENT AND PLAN:  # PAT, PACs: Symptoms have improved since starting metoprolol 12.5 mg q12h.    # Severe Mitral Regurgitation: Echo revealed severe mitral valve  regurgitation secondary to prolapse of the posterior leaflet. It also showed left atrial enlargement but he had normal LV systolic function and size. There is no pulmonary hypertension. Given that he has severe, symptomatic mitral regurgitation, we will refer him to Dr. Roxy Manns for consideration of mitral valve repair.  He is very concerned about the financial implications of being out of work for a prolonged period of time. We discussed the importance of doing the surgery sooner than later, as over time his left ventricular function will begin to deteriorate.  In the interim, we will obtain a transesophageal echo. He will need at least a right heart catheterization and possibly a left heart catheterization at the discretion of Dr. Roxy Manns. He did have a normal stress test in December.   Current medicines are reviewed at length with the patient today.  The patient does not have concerns regarding medicines.  The following changes have been made:  Start metoprolol 12.5 mg bid  Labs/ tests ordered today include:   Orders Placed This Encounter  Procedures  . Ambulatory referral to Cardiothoracic Surgery     Disposition:   FU with Metzli Pollick C. Oval Linsey, MD, [facility] in 2 months   Signed, Jeoffrey Eleazer C. Oval Linsey, MD, [facility]  02/03/2016 9:50 AM    [facility]

## 2016-02-02 NOTE — Telephone Encounter (Signed)
per pof to sch pt appt-gave pt copy of avs °

## 2016-02-02 NOTE — Progress Notes (Signed)
[place] Telephone:([phone]) [phone]   Fax:([phone]) [place], [place] [address]  DIAGNOSIS:  1) Stage II, large B-cell non-Hodgkin's lymphoma diagnosed in September 2012 with a scalp lesion. In addition he had mediastinal lymphadenopathy.  2) bilateral pulmonary embolism diagnosed on CT scan of the chest on 12/28/2012.   PRIOR THERAPY:  1) Systemic chemotherapy with CHOP/Rituxan, given every 3 weeks with Neulasta support, status post 6 cycles, last dose was given 12/01/2011 with complete response.  2) Xarelto 20 mg by mouth a [place].   CURRENT THERAPY: Observation.  INTERVAL HISTORY: Leonard Bentley 55 y.o. male returns to the clinic today for annual followup visit. The patient is feeling fine today with no specific complaints. He is currently on diclofenac and Tylenol for arthritis. He denied having any significant fatigue or weakness. He denied having any weight loss or night sweats. He denied having any chest pain, shortness of breath, cough or hemoptysis. He has no nausea or vomiting. He has no fever or chills. The patient had repeat CBC, comprehensive metabolic panel and LDH performed recently and he is here for evaluation and discussion of his lab results.  MEDICAL HISTORY: Past Medical History  Diagnosis Date  . ED (erectile dysfunction)   . Pulmonary emboli ([place])   . Depression   . Cancer ([place]) 07/2011    STAGE II NON- HODGKIN LYMPHOMA  . Lymphoma, non Hodgkin's 09/25/2011  . Mobitz I 10/09/2015  . PAC (premature atrial contraction) 10/09/2015  . Atrial tachycardia ([place]) 01/06/2016  . Murmur 01/06/2016    ALLERGIES:  has No Known Allergies.  MEDICATIONS:  Current Outpatient Prescriptions  Medication Sig Dispense Refill  . ALPRAZolam (XANAX) 0.25 MG tablet TAKE ONE TABLET BY MOUTH TWICE DAILY AS NEEDED FOR ANXIETY. 15 tablet 0  . diclofenac (VOLTAREN) 75 MG EC tablet Take 1 tablet (75 mg  total) by mouth 2 (two) times daily. 30 tablet 1  . metoprolol tartrate (LOPRESSOR) 25 MG tablet Take 0.5 tablets (12.5 mg total) by mouth 2 (two) times daily. 60 tablet 3   No current facility-administered medications for this visit.    SURGICAL HISTORY:  Past Surgical History  Procedure Laterality Date  . No past surgeries    . Trigger finger release Bilateral     REVIEW OF SYSTEMS:  A comprehensive review of systems was negative except for: Musculoskeletal: positive for arthralgias   PHYSICAL EXAMINATION: General appearance: alert, cooperative and no distress Head: Normocephalic, without obvious abnormality, atraumatic Neck: no adenopathy Lymph nodes: Cervical, supraclavicular, and axillary nodes normal. Resp: clear to auscultation bilaterally Cardio: regular rate and rhythm, S1, S2 normal, no murmur, click, rub or gallop GI: soft, non-tender; bowel sounds normal; no masses,  no organomegaly Extremities: extremities normal, atraumatic, no cyanosis or edema  ECOG PERFORMANCE STATUS: 0 - Asymptomatic  Blood pressure 110/72, pulse 66, temperature 97.9 F (36.6 C), temperature source Oral, resp. rate 18, height 5\' 8"  (1.727 m), weight 187 lb 3.2 oz (84.913 kg), SpO2 100 %.  LABORATORY DATA: Lab Results  Component Value Date   WBC 2.1* 02/02/2016   HGB 11.7* 02/02/2016   HCT 37.2* 02/02/2016   MCV 82.8 02/02/2016   PLT 141 02/02/2016      Chemistry      Component Value Date/Time   NA 142 02/02/2016 0840   NA 138 09/15/2015 0001   NA 140 08/18/2015 0825   NA 137 06/30/2012 0754  K 4.5 02/02/2016 0840   K 4.5 09/15/2015 0001   K 3.8 06/30/2012 0754   CL 99 09/15/2015 0001   CL 98 12/28/2012 0824   CL 97* 06/30/2012 0754   CO2 26 02/02/2016 0840   CO2 28 09/15/2015 0001   CO2 30 06/30/2012 0754   BUN 16.0 02/02/2016 0840   BUN 14 09/15/2015 0001   BUN 14 08/18/2015 0825   BUN 14 06/30/2012 0754   CREATININE 1.1 02/02/2016 0840   CREATININE 1.10 09/15/2015 0001     CREATININE 0.99 08/18/2015 0825      Component Value Date/Time   CALCIUM 8.9 02/02/2016 0840   CALCIUM 8.9 09/15/2015 0001   CALCIUM 8.7 06/30/2012 0754   ALKPHOS 34* 02/02/2016 0840   ALKPHOS 35* 09/15/2015 0001   ALKPHOS 43 06/30/2012 0754   AST 53* 02/02/2016 0840   AST 48* 09/15/2015 0001   AST 62* 06/30/2012 0754   ALT 40 02/02/2016 0840   ALT 35 09/15/2015 0001   ALT 39 06/30/2012 0754   BILITOT 0.64 02/02/2016 0840   BILITOT 0.9 09/15/2015 0001   BILITOT 0.4 08/18/2015 0825   BILITOT 1.60 06/30/2012 0754       RADIOGRAPHIC STUDIES: No results found. ASSESSMENT AND PLAN: This is a very pleasant 55 years old African American male with history of stage II large B-cell non-Hodgkin lymphoma status post systemic chemotherapy with CHOP/Rituxan with complete response and has been observation since January of 2013 with no evidence for disease recurrence. His CBC today showed persistent leukocytopenia and neutropenia most likely secondary to treatment with diclofenac. I strongly recommended for the patient to decrease the amount of diclofenac that he takes on daily basis and use more Tylenol if needed. I will see him back for follow-up visit in 6 months for reevaluation with repeat CBC, comprehensive metabolic panel and LDH. I will consider him for repeat scan only of the patient has any concerning symptoms. He was advised to call immediately if he has any concerning symptoms in the interval.  The patient voices understanding of current disease status and treatment options and is in agreement with the current care plan.  All questions were answered. The patient knows to call the clinic with any problems, questions or concerns. We can certainly see the patient much sooner if necessary.  Disclaimer: This note was dictated with voice recognition software. Similar sounding words can inadvertently be transcribed and may be missed upon review.

## 2016-02-03 ENCOUNTER — Other Ambulatory Visit: Payer: Self-pay | Admitting: Family Medicine

## 2016-02-03 ENCOUNTER — Encounter: Payer: Self-pay | Admitting: Cardiovascular Disease

## 2016-02-03 ENCOUNTER — Telehealth: Payer: Self-pay | Admitting: Family Medicine

## 2016-02-03 ENCOUNTER — Ambulatory Visit (INDEPENDENT_AMBULATORY_CARE_PROVIDER_SITE_OTHER): Payer: No Typology Code available for payment source | Admitting: Cardiovascular Disease

## 2016-02-03 VITALS — BP 112/72 | HR 68 | Ht 69.0 in | Wt 186.6 lb

## 2016-02-03 DIAGNOSIS — I340 Nonrheumatic mitral (valve) insufficiency: Secondary | ICD-10-CM | POA: Diagnosis not present

## 2016-02-03 DIAGNOSIS — I491 Atrial premature depolarization: Secondary | ICD-10-CM | POA: Diagnosis not present

## 2016-02-03 NOTE — Telephone Encounter (Signed)
Renew the Xanax. Explained that at this point the best thing to do for his pain is Tylenol until after the heart issue is resolved.

## 2016-02-03 NOTE — Patient Instructions (Addendum)
Medication Instructions:  Your physician recommends that you continue on your current medications as directed. Please refer to the Current Medication list given to you today.  Labwork: none  Your physician recommends that you schedule a follow-up appointment in: 2 month ov  Will arrange for you to see Dr Ria Bush are scheduled for a TEE on 02-11-16 with Dr. Oval Linsey or associate.  Go to [facility] [place] on 02-11-16 at 9:30am Enter thru the [facility] entrance A No food or drink after midnight the night before. You may take your medications with a sip of water on the Large of your procedure.    Transesophageal Echocardiogram Transesophageal echocardiography (TEE) is a picture test of your heart using sound waves. The pictures taken can give very detailed pictures of your heart. This can help your doctor see if there are problems with your heart. TEE can check:  If your heart has blood clots in it.  How well your heart valves are working.  If you have an infection on the inside of your heart.  Some of the major arteries of your heart.  If your heart valve is working after a Office manager.  Your heart before a procedure that uses a shock to your heart to get the rhythm back to normal. BEFORE THE PROCEDURE  Do not eat or drink for 6 hours before the procedure or as told by your doctor.  Make plans to have someone drive you home after the procedure. Do not drive yourself home.  An IV tube will be put in your arm. PROCEDURE  You will be given a medicine to help you relax (sedative). It will be given through the IV tube.  A numbing medicine will be sprayed or gargled in the back of your throat to help numb it.  The tip of the probe is placed into the back of your mouth. You will be asked to swallow. This helps to pass the probe into your esophagus.  Once the tip of the probe is in the right place, your doctor can take pictures of your heart.  You may feel pressure at  the back of your throat. AFTER THE PROCEDURE  You will be taken to a recovery area so the sedative can wear off.  Your throat may be sore and scratchy. This will go away slowly over time.  You will go home when you are fully awake and able to swallow liquids.  You should have someone stay with you for the next 24 hours.  Do not drive or operate machinery for the next 24 hours.   This information is not intended to replace advice given to you by your health care provider. Make sure you discuss any questions you have with your health care provider.   Document Released: 08/22/2009 Document Revised: 10/30/2013 Document Reviewed: 04/26/2013 Elsevier Interactive Patient Education [employer].

## 2016-02-03 NOTE — Telephone Encounter (Signed)
Xanax called in. Patient notified to only take Tylenol for pain if needed.

## 2016-02-03 NOTE — Telephone Encounter (Signed)
Pt come by and states that he went to the cardiologist and that they took him off of the [place], and states that they his primary dr needs to find him a alternative to that medicine, cardiologist is aprt of [facility]. And pt also needs xanax refill, pt uses [facility] [place], [place] - [address] and pt can be reached at [phone] (H)

## 2016-02-04 ENCOUNTER — Other Ambulatory Visit: Payer: Self-pay | Admitting: Family Medicine

## 2016-02-04 NOTE — Telephone Encounter (Signed)
Okay to renew

## 2016-02-04 NOTE — Telephone Encounter (Signed)
Is this ok to refill?  

## 2016-02-05 ENCOUNTER — Institutional Professional Consult (permissible substitution) (INDEPENDENT_AMBULATORY_CARE_PROVIDER_SITE_OTHER): Payer: No Typology Code available for payment source | Admitting: Thoracic Surgery (Cardiothoracic Vascular Surgery)

## 2016-02-05 ENCOUNTER — Encounter: Payer: Self-pay | Admitting: Thoracic Surgery (Cardiothoracic Vascular Surgery)

## 2016-02-05 VITALS — BP 130/84 | HR 72 | Resp 16 | Ht 69.0 in | Wt 187.0 lb

## 2016-02-05 DIAGNOSIS — I340 Nonrheumatic mitral (valve) insufficiency: Secondary | ICD-10-CM

## 2016-02-05 NOTE — Progress Notes (Signed)
[address]       ,[place] [id number]             [place] REPORT  Referring Provider is [name], MD PCP is Wyatt Haste, MD  Chief Complaint  Patient presents with  . Mitral Regurgitation    EVAL FOR SURGERY...ECHO 01/20/16, STRESS TEST 10/15/15    HPI:  Patient is a 55 year old African-American male who was recently found to have mitral regurgitation and was referred for elective surgical consultation. The patient states that he first began to experience a subtle decrease in his exercise tolerance approximately 1 year ago. The patient exercises regularly and noticed that he tended to get fatigued earlier. This gradually progressed. Over the last 3 months the patient has developed further progression of symptoms with frequent tachycardia palpitations, dizzy spells, and occasional episodes of exertional shortness of breath. The patient states that if he had walked up 3 flights of stairs he would be slightly winded. This has occurred despite the fact that the patient exercises regularly and runs several times a week.  Last November he was seen by his primary care physician for a routine examination. 12-lead electrocardiogram revealed Mobitz type I second-degree AV block.  He was referred for cardiology consultation and initially evaluated by Dr. Oval Linsey on 10/09/2015.  He underwent a nuclear stress test that was felt to be low risk with resting ejection fraction 52% and no significant EKG changes or perfusion abnormalities during stress.  He was seen in follow-up by Dr. Oval Linsey in February and reported more symptoms of fatigue and exertional shortness of breath. He was complaining of more palpitations and EKG revealed non-conducted PACs. He was started on low-dose beta blocker at that time. He was also noted have a systolic murmur on physical exam and a transthoracic echocardiogram was performed 01/20/2016.  Echocardiogram revealed mitral valve prolapse with moderate to severe mitral regurgitation. Left ventricular systolic function was normal with ejection fraction estimated 60-65%. There was severe left atrial enlargement.  He was seen in follow-up by Dr. Oval Linsey on 02/03/2016 reported feeling some better since he had started taking metoprolol. He was scheduled for transesophageal echocardiogram and referred for surgical consultation.  The patient is single and lives locally by himself in [place]. He has 1 adult daughter who lives in [place]. He works full-time doing Dealer at [employer]. He has remained physically active all of his adult life.  He enjoys running and typically runs at least 3 or 4 times every week. He reports no significant physical limitations up until the past year. He has mild arthritis in his back that does not limit his activity to any significant degree. 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 He states that he can still go running and does not typically get short of breath with running. However, if he walks up 3 flights of stairs he will be out of breath. He has never had any resting shortness of breath, PND, orthopnea, or lower extremity edema. He has not had any chest pain or chest tightness either with activity or at rest.  He still has palpitations although the frequency of palpitations has decreased since he has been taking metoprolol. He has had some dizzy spells without syncope.  Past Medical History  Diagnosis Date  . ED (erectile dysfunction)   . Pulmonary emboli ([place])   . Depression   . Cancer (  [facility]) 07/2011    STAGE II NON- HODGKIN LYMPHOMA  . Lymphoma, non Hodgkin's 09/25/2011  . Mobitz I 10/09/2015  . PAC (premature atrial contraction) 10/09/2015  . Atrial tachycardia ([facility]) 01/06/2016  . Murmur 01/06/2016  . Mitral regurgitation 01/20/2016    Past Surgical History  Procedure Laterality Date    . No past surgeries    . Trigger finger release Bilateral     Family History  Problem Relation Age of Onset  . Cancer Mother     BREAST(BONE)  . Cancer Father     PANCREATIC    Social History   Social History  . Marital Status: Single    Spouse Name: N/A  . Number of Children: N/A  . Years of Education: N/A   Occupational History  . Not on file.   Social History Main Topics  . Smoking status: Former Smoker -- 1.00 packs/Goodpasture for 32 years    Types: Cigarettes    Quit date: 07/06/2013  . Smokeless tobacco: Current User  . Alcohol Use: 0.0 oz/week    0 Standard drinks or equivalent per week     Comment: 5th a [place] of hard liquor  . Drug Use: No  . Sexual Activity: No   Other Topics Concern  . Not on file   Social History Narrative   Epworth sleepiness scale as of 10/09/15 a 1    Current Outpatient Prescriptions  Medication Sig Dispense Refill  . ALPRAZolam (XANAX) 0.25 MG tablet TAKE ONE TABLET BY MOUTH TWICE DAILY AS NEEDED FOR ANXIETY 15 tablet 0  . metoprolol tartrate (LOPRESSOR) 25 MG tablet Take 0.5 tablets (12.5 mg total) by mouth 2 (two) times daily. 60 tablet 3   No current facility-administered medications for this visit.    No Known Allergies    Review of Systems:   General:  normal appetite, decreased energy, no weight gain, no weight loss, no fever  Cardiac:  no chest pain with exertion, no chest pain at rest, + SOB with exertion, no resting SOB, no PND, no orthopnea, + palpitations, + arrhythmia, no atrial fibrillation, no LE edema, + dizzy spells, no syncope  Respiratory:  + slight exertional shortness of breath, no home oxygen, no productive cough, no dry cough, no bronchitis, no wheezing, no hemoptysis, no asthma, no pain with inspiration or cough, no sleep apnea, no CPAP at night  GI:   no difficulty swallowing, no reflux, no frequent heartburn, no hiatal hernia, no abdominal pain, no constipation, no diarrhea, no hematochezia, no hematemesis, no  melena  GU:   no dysuria,  no frequency, no urinary tract infection, no hematuria, no enlarged prostate, no kidney stones, no kidney disease  Vascular:  no pain suggestive of claudication, + pain in feet, + occasional leg cramps, no varicose veins, no DVT, no non-healing foot ulcer  Neuro:   no stroke, no TIA's, no seizures, no headaches, no temporary blindness one eye,  no slurred speech, + peripheral neuropathy, no chronic pain, no instability of gait, no memory/cognitive dysfunction  Musculoskeletal: + mild arthritis, no joint swelling, no myalgias, no difficulty walking, normal mobility   Skin:   no rash, no itching, no skin infections, no pressure sores or ulcerations  Psych:   + anxiety, no depression, + nervousness, + unusual recent stress  Eyes:   + blurry vision, + floaters, no recent vision changes, + wears glasses or contacts  ENT:   no hearing loss, no loose or painful teeth, no dentures, last saw dentist December  2016  Hematologic:  no easy bruising, no abnormal bleeding, no clotting disorder, no frequent epistaxis  Endocrine:  no diabetes, does not check CBG's at home     Physical Exam:   BP 130/84 mmHg  Pulse 72  Resp 16  Ht 5\' 9"  (1.753 m)  Wt 187 lb (84.823 kg)  BMI 27.60 kg/m2  SpO2 98%  General:    well-appearing  HEENT:  Unremarkable   Neck:   no JVD, no bruits, no adenopathy   Chest:   clear to auscultation, symmetrical breath sounds, no wheezes, no rhonchi   CV:   RRR, blowing IV/VI holosystolic murmur   Abdomen:  soft, non-tender, no masses   Extremities:  warm, well-perfused, pulses palpable, no LE edema  Rectal/GU  Deferred  Neuro:   Grossly non-focal and symmetrical throughout  Skin:   Clean and dry, no rashes, no breakdown   Diagnostic Tests:  Transthoracic Echocardiography  Patient: Rainier, Binger MR #: [id number] Study Date: 01/20/2016 Gender: M Age: 65 Height: 172.7 cm Weight: 84.4 kg BSA: 2.03 m^2 Pt.  Status: Room:  SONOGRAPHER [place], Will ATTENDING Lyman Bishop MD PERFORMING [facility] Outpatient ORDERING [name], MD [place], MD  cc:  ------------------------------------------------------------------- LV EF: 60% - 65%  ------------------------------------------------------------------- Indications: (R06.00).  ------------------------------------------------------------------- History: PMH: Acquired from the patient and from the patient&'s chart. Dyspnea and murmur. Risk factors: Current tobacco use.  ------------------------------------------------------------------- Study Conclusions  - Left ventricle: The cavity size was normal. Wall thickness was  increased in a pattern of moderate LVH. Systolic function was  normal. The estimated ejection fraction was in the range of 60%  to 65%. Wall motion was normal; there were no regional wall  motion abnormalities. The study is not technically sufficient to  allow evaluation of LV diastolic function. - Aortic valve: Sclerosis without stenosis. There was trivial  regurgitation. - Mitral valve: Mildly thickened leaflets with predominate prolapse  of the posterior leaflet and anteriorly directed mitral  regurgitation. There was moderate to severe eccentric  regurgitation directed at the atrial septum. - Left atrium: Massively dilated at 85 ml/m2. - Tricuspid valve: There was mild regurgitation. - Pulmonary arteries: PA peak pressure: 27 mm Hg (S). - Inferior vena cava: The vessel was normal in size. The  respirophasic diameter changes were in the normal range (>= 50%),  consistent with normal central venous pressure.  Impressions:  - LVEF 60-65%, moderate LVH, trivial AI, moderate to more likely  severe eccentric MR directed anteriorly and to the interatrial  septum, posterior mitral valve leaflet prolpase, massive LAE,  mild TR, RVSP 27 mmHg,  normal IVC.  Transthoracic echocardiography. M-mode, complete 2D, spectral Doppler, and color Doppler. Birthdate: Patient birthdate: 08/21/1961. Age: Patient is 55 yr old. Sex: Gender: male. BMI: 28.3 kg/m^2. Blood pressure: 128/88 Patient status: Outpatient. Study date: Study date: 01/20/2016. Study time: 01:22 PM. Location: [facility]  -------------------------------------------------------------------  ------------------------------------------------------------------- Left ventricle: The cavity size was normal. Wall thickness was increased in a pattern of moderate LVH. Systolic function was normal. The estimated ejection fraction was in the range of 60% to 65%. Wall motion was normal; there were no regional wall motion abnormalities. The study is not technically sufficient to allow evaluation of LV diastolic function.  ------------------------------------------------------------------- Aortic valve: Sclerosis without stenosis. Doppler: There was trivial regurgitation.  ------------------------------------------------------------------- Aorta: Aortic root: The aortic root was normal in size. Ascending aorta: The ascending aorta was normal in size.  ------------------------------------------------------------------- Mitral valve: Mildly thickened leaflets with predominate prolapse of  the posterior leaflet and anteriorly directed mitral regurgitation. Doppler: There was moderate to severe eccentric regurgitation directed at the atrial septum. Peak gradient (D): 4 mm Hg.  ------------------------------------------------------------------- Left atrium: Massively dilated at 85 ml/m2.  ------------------------------------------------------------------- Right ventricle: The cavity size was normal. Wall thickness was normal. Systolic function was normal.  ------------------------------------------------------------------- Pulmonic  valve: The valve appears to be grossly normal. Doppler: There was trivial regurgitation.  ------------------------------------------------------------------- Tricuspid valve: Doppler: There was mild regurgitation.  ------------------------------------------------------------------- Pulmonary artery: The main pulmonary artery was normal-sized.  ------------------------------------------------------------------- Right atrium: The atrium was normal in size.  ------------------------------------------------------------------- Pericardium: There was no pericardial effusion.  ------------------------------------------------------------------- Systemic veins: Inferior vena cava: The vessel was normal in size. The respirophasic diameter changes were in the normal range (>= 50%), consistent with normal central venous pressure.  ------------------------------------------------------------------- Measurements  Left ventricle Value Reference LV ID, ED, PLAX chordal 48.4 mm 43 - 52 LV ID, ES, PLAX chordal 30.9 mm 23 - 38 LV fx shortening, PLAX chordal 36 % >=29 LV PW thickness, ED 13.7 mm --------- IVS/LV PW ratio, ED 1 <=1.3  Ventricular septum Value Reference IVS thickness, ED 13.7 mm ---------  LVOT Value Reference LVOT ID, S 27 mm --------- LVOT area 5.73 cm^2 --------- LVOT ID 27 mm --------- LVOT peak velocity, S 66.2 cm/s --------- LVOT mean velocity, S 42.7 cm/s --------- LVOT VTI, S 11.6 cm --------- LVOT peak gradient, S  2 mm Hg --------- Stroke volume (SV), LVOT DP 66.4 ml --------- Stroke index (SV/bsa), LVOT DP 32.7 ml/m^2 ---------  Aorta Value Reference Aortic root ID, ED 38 mm --------- Ascending aorta ID, A-P, S 35 mm ---------  Left atrium Value Reference LA ID, A-P, ES 49 mm --------- LA ID/bsa, A-P (H) 2.41 cm/m^2 <=2.2 LA volume, S 148 ml --------- LA volume/bsa, S 72.9 ml/m^2 --------- LA volume, ES, 1-p A4C 168 ml --------- LA volume/bsa, ES, 1-p A4C 82.7 ml/m^2 --------- LA volume, ES, 1-p A2C 116 ml --------- LA volume/bsa, ES, 1-p A2C 57.1 ml/m^2 ---------  Mitral valve Value Reference Mitral E-wave peak velocity 94.4 cm/s --------- Mitral A-wave peak velocity 42.9 cm/s --------- Mitral deceleration time 194 ms 150 - 230 Mitral peak gradient, D 4 mm Hg --------- Mitral E/A ratio, peak 2.4 ---------  Pulmonary arteries Value Reference PA pressure, S, DP 27 mm Hg <=30  Tricuspid valve Value Reference Tricuspid regurg peak velocity 247 cm/s --------- Tricuspid peak RV-RA gradient 24 mm Hg ---------  Systemic veins Value Reference Estimated CVP 3 mm Hg ---------  Right ventricle Value Reference RV pressure, S, DP 27 mm Hg <=30  Legend: (L) and (H) mark values outside  specified reference range.  ------------------------------------------------------------------- Prepared and Electronically Authenticated by  Lyman Bishop MD 2017-03-14T21:11:38   Impression:  I personally reviewed the patient's recent transthoracic echocardiogram. The patient has mitral valve prolapse with at least moderate if not severe mitral regurgitation. Left ventricular systolic function remains preserved. There is severe left atrial enlargement. The patient presents with very mild symptoms of exertional shortness of breath and decreased exercise tolerance as well as frequent palpitations that have diminished on beta blocker therapy. I feel the patient likely has stage D severe symptomatic aortic stenosis and agree with plans to proceed with transesophageal echocardiogram to better characterize the severity of mitral regurgitation and the likelihood of valve repair with elective surgery.   Plan:  The patient was counseled at length regarding the indications, risks and potential benefits of mitral valve repair.  The rationale for elective surgery has been  explained, including a comparison between surgery and continued medical therapy with close follow-up.  We discussed the role for transesophageal echocardiogram and how findings might affect our recommendations.  The patient would like to return for routine follow-up in 2 weeks once his TEE has been performed.  If TEE confirms the presence of severe mitral regurgitation I would favor proceeding with left and right heart catheterization.  All of his questions have been addressed.   I spent in excess of 90 minutes during the conduct of this office consultation and >50% of this time involved direct face-to-face encounter with the patient for counseling and/or coordination of their care.   Valentina Gu. Roxy Manns, MD 02/05/2016 6:07 PM

## 2016-02-05 NOTE — Patient Instructions (Signed)
Continue all previous medications without any changes at this time  

## 2016-02-06 ENCOUNTER — Ambulatory Visit: Payer: No Typology Code available for payment source | Admitting: Cardiovascular Disease

## 2016-02-06 NOTE — Addendum Note (Signed)
Addended by: [name] C on: 02/06/2016 12:28 AM   Modules accepted: Orders

## 2016-02-11 ENCOUNTER — Ambulatory Visit (HOSPITAL_COMMUNITY)
Admission: RE | Admit: 2016-02-11 | Discharge: 2016-02-11 | Disposition: A | Discharge status: Home / Self Care | Payer: No Typology Code available for payment source | Source: Ambulatory Visit | Attending: Cardiovascular Disease | Admitting: Cardiovascular Disease

## 2016-02-11 ENCOUNTER — Ambulatory Visit (HOSPITAL_BASED_OUTPATIENT_CLINIC_OR_DEPARTMENT_OTHER): Payer: No Typology Code available for payment source

## 2016-02-11 ENCOUNTER — Encounter (HOSPITAL_COMMUNITY)
Admission: RE | Disposition: A | Discharge status: Home / Self Care | Payer: Self-pay | Source: Ambulatory Visit | Attending: Cardiovascular Disease

## 2016-02-11 ENCOUNTER — Encounter (HOSPITAL_COMMUNITY): Payer: Self-pay | Admitting: Cardiovascular Disease

## 2016-02-11 DIAGNOSIS — I441 Atrioventricular block, second degree: Secondary | ICD-10-CM | POA: Diagnosis not present

## 2016-02-11 DIAGNOSIS — I340 Nonrheumatic mitral (valve) insufficiency: Secondary | ICD-10-CM | POA: Diagnosis not present

## 2016-02-11 DIAGNOSIS — I341 Nonrheumatic mitral (valve) prolapse: Secondary | ICD-10-CM | POA: Insufficient documentation

## 2016-02-11 DIAGNOSIS — Z87891 Personal history of nicotine dependence: Secondary | ICD-10-CM | POA: Diagnosis not present

## 2016-02-11 DIAGNOSIS — Z86711 Personal history of pulmonary embolism: Secondary | ICD-10-CM | POA: Insufficient documentation

## 2016-02-11 DIAGNOSIS — F329 Major depressive disorder, single episode, unspecified: Secondary | ICD-10-CM | POA: Insufficient documentation

## 2016-02-11 DIAGNOSIS — Z8572 Personal history of non-Hodgkin lymphomas: Secondary | ICD-10-CM | POA: Diagnosis not present

## 2016-02-11 DIAGNOSIS — Z79899 Other long term (current) drug therapy: Secondary | ICD-10-CM | POA: Diagnosis not present

## 2016-02-11 HISTORY — PX: TEE WITHOUT CARDIOVERSION: SHX5443

## 2016-02-11 SURGERY — ECHOCARDIOGRAM, TRANSESOPHAGEAL
Anesthesia: Moderate Sedation

## 2016-02-11 MED ORDER — MIDAZOLAM HCL 5 MG/ML IJ SOLN
INTRAMUSCULAR | Status: AC
  Filled 2016-02-11: qty 2

## 2016-02-11 MED ORDER — BUTAMBEN-TETRACAINE-BENZOCAINE 2-2-14 % EX AERO
INHALATION_SPRAY | CUTANEOUS | Status: DC | PRN
  Administered 2016-02-11: 2 via TOPICAL

## 2016-02-11 MED ORDER — MIDAZOLAM HCL 10 MG/2ML IJ SOLN
INTRAMUSCULAR | Status: DC | PRN
  Administered 2016-02-11 (×4): 2 mg via INTRAVENOUS

## 2016-02-11 MED ORDER — FENTANYL CITRATE (PF) 100 MCG/2ML IJ SOLN
INTRAMUSCULAR | Status: DC | PRN
  Administered 2016-02-11 (×2): 25 ug via INTRAVENOUS
  Administered 2016-02-11: 50 ug via INTRAVENOUS

## 2016-02-11 MED ORDER — FENTANYL CITRATE (PF) 100 MCG/2ML IJ SOLN
INTRAMUSCULAR | Status: AC
  Filled 2016-02-11: qty 2

## 2016-02-11 MED ORDER — SODIUM CHLORIDE 0.9 % IV SOLN
INTRAVENOUS | Status: DC
  Administered 2016-02-11: 500 mL via INTRAVENOUS

## 2016-02-11 NOTE — Progress Notes (Signed)
  Echocardiogram Echocardiogram Transesophageal has been performed.  Darlina Sicilian M 02/11/2016, 11:52 AM

## 2016-02-11 NOTE — H&P (View-Only) (Signed)
Cardiology Office Note   Date:  02/03/2016   ID:  Leonard Bentley, DOB Jul 15, 1961, MRN [id number]  PCP:  Wyatt Haste, MD  Cardiologist:   Sharol Harness, MD   Chief Complaint  Patient presents with  . Follow-up    weak after exercise, feels Metoprolol helping       Patient ID: Leonard Bentley is a 55 y.o. male with tobacco abuse and non-Hodgkin's lymphoma s/p chemotherapy who presents for and evaluation of second degree heart block.    Interval History 02/04/16: At the last appointment Leonard Bentley was started on metoprolol 12.5 mg twice a [place] for symptomatic, non-conducted PACs.  He continues to have some palpitations but thinks that they are significantly improved. They have been less frequent and the duration is shorter since starting metoprolol.  After his last appointment Leonard Bentley had an echo that showed prolapse of the posterior leaflet of the mitral valve with moderate to severe mitral regurgitation.  LVEF was 60-65% and his left ventricle was not dilated.  His left atrium was massively enlarged.  Pulmonary pressures were normal.   He continues to run for exercise and thinks that his endurance is slightly improved since starting metoprolol. However he is not back to baseline. He denies any lower extremity edema, orthopnea, or PND. He has not noted any chest pain or pressure.   Interval History 01/05/16: After his last appointment  Leonard Bentley underwent exercise Cardiolite that revealed LVEF 52% and no ischemia.  Leonard Bentley reports feeling fatigued lately.  He notes that he is not able to exercise or push himself as much as he used to.  Some days he feels tired for the entire [place] and other days he feels OK.  He denies chest pain but does sometimes feel short of breath with exertion.  He noticed this when walking up stairs recently.  He typically would not have any trouble with this level of exertion.   He also noted some pain in his legs when walking up the stairs.  He has occasionally noted  some lightheadedness and dizziness.  He denies lower extremity edema, orthopnea or PND.  History of Present Illness 10/09/15: Leonard Bentley saw his PCP, Leonard Bentley, on 09/15/15.  At that appointment he was noted to have Mobitz I second degree heart block.  He was referred to cardiology for further evaluation.  Leonard Bentley has been feeling well.  He runs several miles 5 days per week. He denies any chest pain or shortness of breath with this activity.  He denies lightheadedness, dizziness, lower extremity edema, orthopnea or PND. His only complaint is aches and pains from degenerative joint disease.  He has noticed that he is not quite as fast on his runs as he used to be. However he attributes this to decreasing his running regimen from daily to 5 days per week.     Past Medical History  Diagnosis Date  . ED (erectile dysfunction)   . Pulmonary emboli ([place])   . Depression   . Cancer ([place]) 07/2011    STAGE II NON- HODGKIN LYMPHOMA  . Lymphoma, non Hodgkin's 09/25/2011  . Mobitz I 10/09/2015  . PAC (premature atrial contraction) 10/09/2015  . Atrial tachycardia ([place]) 01/06/2016  . Murmur 01/06/2016    Past Surgical History  Procedure Laterality Date  . No past surgeries    . Trigger finger release Bilateral      Current Outpatient Prescriptions  Medication Sig Dispense Refill  . ALPRAZolam (  XANAX) 0.25 MG tablet TAKE ONE TABLET BY MOUTH TWICE DAILY AS NEEDED FOR ANXIETY. 15 tablet 0  . metoprolol tartrate (LOPRESSOR) 25 MG tablet Take 0.5 tablets (12.5 mg total) by mouth 2 (two) times daily. 60 tablet 3   No current facility-administered medications for this visit.    Allergies:   Review of patient's allergies indicates no known allergies.    Social History:  The patient  reports that he quit smoking about 2 years ago. His smoking use included Cigarettes. He has a 32 pack-year smoking history. He uses smokeless tobacco. He reports that he drinks alcohol. He reports that he does not use  illicit drugs.   Family History:  The patient's family history includes Cancer in his father and mother.    ROS:  Please see the history of present illness.   Otherwise, review of systems are positive for none.   All other systems are reviewed and negative.    PHYSICAL EXAM: VS:  BP 112/72 mmHg  Pulse 68  Ht 5\' 9"  (1.753 m)  Wt 84.641 kg (186 lb 9.6 oz)  BMI 27.54 kg/m2 , BMI Body mass index is 27.54 kg/(m^2). GENERAL:  Well appearing HEENT:  Pupils equal round and reactive, fundi not visualized, oral mucosa unremarkable NECK:  No jugular venous distention, waveform within normal limits, carotid upstroke brisk and symmetric, no bruits LYMPHATICS:  No cervical adenopathy LUNGS:  Clear to auscultation bilaterally HEART:  RRR.  PMI not displaced or sustained,S1 and S2 within normal limits, no S3, no S4, no clicks, no rubs, II/VI holosystolic murmur at the apex ABD:  Flat, positive bowel sounds normal in frequency in pitch, no bruits, no rebound, no guarding, no midline pulsatile mass, no hepatomegaly, no splenomegaly EXT:  2 plus pulses throughout, no edema, no cyanosis no clubbing SKIN:  No rashes no nodules NEURO:  Cranial nerves II through XII grossly intact, motor grossly intact throughout PSYCH:  Cognitively intact, oriented to person place and time   EKG:  EKG is not ordered today.  09/15/15: sinus rhythm rate 87 bpm. PACs. Second degree heart block, Mobitz I.  LAFB  Exercise Cardiolite 10/15/15:  Nuclear stress EF: 52%.  There was no ST segment deviation noted during stress.  No T wave inversion was noted during stress.  The study is normal.  This is a low risk study.  Low risk stress nuclear study with normal perfusion and normal left ventricular regional systolic function. Borderline global LVEF 52%.  Echo 01/20/16: Study Conclusions  - Left ventricle: The cavity size was normal. Wall thickness was  increased in a pattern of moderate LVH. Systolic function  was  normal. The estimated ejection fraction was in the range of 60%  to 65%. Wall motion was normal; there were no regional wall  motion abnormalities. The study is not technically sufficient to  allow evaluation of LV diastolic function. - Aortic valve: Sclerosis without stenosis. There was trivial  regurgitation. - Mitral valve: Mildly thickened leaflets with predominate prolapse  of the posterior leaflet and anteriorly directed mitral  regurgitation. There was moderate to severe eccentric  regurgitation directed at the atrial septum. - Left atrium: Massively dilated at 85 ml/m2. - Tricuspid valve: There was mild regurgitation. - Pulmonary arteries: PA peak pressure: 27 mm Hg (S). - Inferior vena cava: The vessel was normal in size. The  respirophasic diameter changes were in the normal range (>= 50%),  consistent with normal central venous pressure.  Impressions:  - LVEF 60-65%, moderate LVH,  trivial AI, moderate to more likely  severe eccentric MR directed anteriorly and to the interatrial  septum, posterior mitral valve leaflet prolpase, massive LAE,  mild TR, RVSP 27 mmHg, normal IVC.   Recent Labs: 10/10/2015: TSH 0.687 02/02/2016: ALT 40; BUN 16.0; Creatinine 1.1; HGB 11.7*; Platelets 141; Potassium 4.5; Sodium 142    Lipid Panel    Component Value Date/Time   CHOL 211* 09/15/2015 0001   CHOL 238* 08/18/2015 0825   TRIG 54 09/15/2015 0001   HDL 125 09/15/2015 0001   HDL 124 08/18/2015 0825   CHOLHDL 1.7 09/15/2015 0001   CHOLHDL 1.9 08/18/2015 0825   VLDL 11 09/15/2015 0001   LDLCALC 75 09/15/2015 0001   LDLCALC 94 08/18/2015 0825      Wt Readings from Last 3 Encounters:  02/03/16 84.641 kg (186 lb 9.6 oz)  02/02/16 84.913 kg (187 lb 3.2 oz)  01/05/16 84.369 kg (186 lb)      ASSESSMENT AND PLAN:  # PAT, PACs: Symptoms have improved since starting metoprolol 12.5 mg q12h.    # Severe Mitral Regurgitation: Echo revealed severe mitral valve  regurgitation secondary to prolapse of the posterior leaflet. It also showed left atrial enlargement but he had normal LV systolic function and size. There is no pulmonary hypertension. Given that he has severe, symptomatic mitral regurgitation, we will refer him to Dr. Roxy Manns for consideration of mitral valve repair.  He is very concerned about the financial implications of being out of work for a prolonged period of time. We discussed the importance of doing the surgery sooner than later, as over time his left ventricular function will begin to deteriorate.  In the interim, we will obtain a transesophageal echo. He will need at least a right heart catheterization and possibly a left heart catheterization at the discretion of Dr. Roxy Manns. He did have a normal stress test in December.   Current medicines are reviewed at length with the patient today.  The patient does not have concerns regarding medicines.  The following changes have been made:  Start metoprolol 12.5 mg bid  Labs/ tests ordered today include:   Orders Placed This Encounter  Procedures  . Ambulatory referral to Cardiothoracic Surgery     Disposition:   FU with Maddoxx Burkitt C. Oval Linsey, MD, [facility] in 2 months   Signed, Omkar Stratmann C. Oval Linsey, MD, [facility]  02/03/2016 9:50 AM    [facility]

## 2016-02-11 NOTE — Interval H&P Note (Signed)
History and Physical Interval Note:  02/11/2016 9:20 AM  Leonard Bentley  has presented today for surgery, with the diagnosis of Severe Mitral Valve  The various methods of treatment have been discussed with the patient and family. After consideration of risks, benefits and other options for treatment, the patient has consented to  Procedure(s): TRANSESOPHAGEAL ECHOCARDIOGRAM (TEE) (N/A) as a surgical intervention .  The patient's history has been reviewed, patient examined, no change in status, stable for surgery.  I have reviewed the patient's chart and labs.  Questions were answered to the patient's satisfaction.     [name] C. Oval Linsey, MD, [facility]

## 2016-02-11 NOTE — CV Procedure (Signed)
Brief TEE Note  LVEF >55% Moderate-severe mitral regurgitation due to posterior prolapse. LA enlargement  Mr. Creason had several runs of atrial tachycardia throughout the procedure.  He tolerated the procedure well.  Sedation: Fentanyl 100 mcg, Versed 8mg  I was present throughout the entire procedure. Sedation time was 9 minutes.  For additional details see full report.   Kensly Bowmer C. Oval Linsey, MD, [facility]  02/11/2016  11:36 AM

## 2016-02-11 NOTE — Discharge Instructions (Signed)
Moderate Conscious Sedation, Adult °Sedation is the use of medicines to promote relaxation and relieve discomfort and anxiety. Moderate conscious sedation is a type of sedation. Under moderate conscious sedation you are less alert than normal but are still able to respond to instructions or stimulation. Moderate conscious sedation is used during short medical and dental procedures. It is milder than deep sedation or general anesthesia and allows you to return to your regular activities sooner. °LET YOUR HEALTH CARE PROVIDER KNOW ABOUT:  °· Any allergies you have. °· All medicines you are taking, including vitamins, herbs, eye drops, creams, and over-the-counter medicines. °· Use of steroids (by mouth or creams). °· Previous problems you or members of your family have had with the use of anesthetics. °· Any blood disorders you have. °· Previous surgeries you have had. °· Medical conditions you have. °· Possibility of pregnancy, if this applies. °· Use of cigarettes, alcohol, or illegal drugs. °RISKS AND COMPLICATIONS °Generally, this is a safe procedure. However, as with any procedure, problems can occur. Possible problems include: °· Oversedation. °· Trouble breathing on your own. You may need to have a breathing tube until you are awake and breathing on your own. °· Allergic reaction to any of the medicines used for the procedure. °BEFORE THE PROCEDURE °· You may have blood tests done. These tests can help show how well your kidneys and liver are working. They can also show how well your blood clots. °· A physical exam will be done.   °· Only take medicines as directed by your health care provider. You may need to stop taking medicines (such as blood thinners, aspirin, or nonsteroidal anti-inflammatory drugs) before the procedure.   °· Do not eat or drink at least 6 hours before the procedure or as directed by your health care provider. °· Arrange for a responsible adult, family member, or friend to take you home  after the procedure. He or she should stay with you for at least 24 hours after the procedure, until the medicine has worn off. °PROCEDURE  °· An intravenous (IV) catheter will be inserted into one of your veins. Medicine will be able to flow directly into your body through this catheter. You may be given medicine through this tube to help prevent pain and help you relax. °· The medical or dental procedure will be done. °AFTER THE PROCEDURE °· You will stay in a recovery area until the medicine has worn off. Your blood pressure and pulse will be checked.   °·  Depending on the procedure you had, you may be allowed to go home when you can tolerate liquids and your pain is under control. °  °This information is not intended to replace advice given to you by your health care provider. Make sure you discuss any questions you have with your health care provider. °  °Document Released: 07/20/2001 Document Revised: 11/15/2014 Document Reviewed: 07/02/2013 °Elsevier Interactive Patient Education ©2016 [employer] Inc[employer] ° °

## 2016-02-12 ENCOUNTER — Encounter (HOSPITAL_COMMUNITY): Payer: Self-pay | Admitting: Cardiovascular Disease

## 2016-02-23 ENCOUNTER — Ambulatory Visit (INDEPENDENT_AMBULATORY_CARE_PROVIDER_SITE_OTHER): Payer: No Typology Code available for payment source | Admitting: Thoracic Surgery (Cardiothoracic Vascular Surgery)

## 2016-02-23 ENCOUNTER — Encounter: Payer: Self-pay | Admitting: Thoracic Surgery (Cardiothoracic Vascular Surgery)

## 2016-02-23 VITALS — BP 120/83 | HR 76 | Resp 16 | Ht 69.0 in | Wt 187.0 lb

## 2016-02-23 DIAGNOSIS — I340 Nonrheumatic mitral (valve) insufficiency: Secondary | ICD-10-CM | POA: Diagnosis not present

## 2016-02-23 NOTE — Progress Notes (Signed)
[address]       [place] [address]             [phone]     CARDIOTHORACIC SURGERY OFFICE NOTE  Referring Provider is [name], MD PCP is Wyatt Haste, MD   HPI:  Patient returns to the office today for follow-up of severe mitral regurgitation. He was originally seen in consultation on 02/05/2016. Since then he underwent transesophageal echocardiogram to further characterize the severity of mitral regurgitation and the likelihood of valve repair with elective surgery. He reports no new problems or complaints over the last few weeks. He continues to exercise regularly although he states that he does not push himself is hard as he used to in the past. He reports no symptoms at all with ordinary activities but he does note a slight decrease in his exercise tolerance and occasional palpitations. The remainder of his review of systems is unchanged from previously.   Current Outpatient Prescriptions  Medication Sig Dispense Refill  . ALPRAZolam (XANAX) 0.25 MG tablet TAKE ONE TABLET BY MOUTH TWICE DAILY AS NEEDED FOR ANXIETY 15 tablet 0  . metoprolol tartrate (LOPRESSOR) 25 MG tablet Take 0.5 tablets (12.5 mg total) by mouth 2 (two) times daily. 60 tablet 3   No current facility-administered medications for this visit.      Physical Exam:   BP 120/83 mmHg  Pulse 76  Resp 16  Ht 5\' 9"  (1.753 m)  Wt 187 lb (84.823 kg)  BMI 27.60 kg/m2  SpO2 98%  General:  All appearing  Chest:   Clear to auscultation  CV:   Regular rate and rhythm with holosystolic murmur  Incisions:  n/a  Abdomen:  Soft nontender  Extremities:  Warm and well-perfused  Diagnostic Tests:  Transesophageal Echocardiography  Patient: Leonard, Bentley MR #: [id number] Study Date: 02/11/2016 Gender: M Age: 55 Height: 175.3 cm Weight: 84.8 kg BSA: 2.05 m^2 Pt. Status: Room:  SONOGRAPHER Darlina Sicilian, RDCS ADMITTING  [name], MD ATTENDING [name], MD ORDERING [name], MD PERFORMING [name], MD [place], MD  cc:  ------------------------------------------------------------------- LV EF: 60% - 65%  ------------------------------------------------------------------- Indications: Mitral regurgitation 424.0.  ------------------------------------------------------------------- History: PMH: Non-Hodgkin Lymphoma. Atrial Tachycardia. Murmur. PMH: Pulmonary Emboli.  ------------------------------------------------------------------- Study Conclusions  - Left ventricle: Systolic function was normal. The estimated  ejection fraction was in the range of 60% to 65%. Wall motion was  normal; there were no regional wall motion abnormalities. - Aortic valve: There was no regurgitation. - Mitral valve: Mild prolapse, involving the middle scallop of the  posterior leaflet. There was moderate to severe regurgitation  directed eccentrically and anteriorly. Valve area by continuity  equation (using LVOT flow): 2.55 cm^2. Effective regurgitant  orifice (PISA): 0.33 cm^2. Regurgitant volume (PISA): 48 ml. - Left atrium: No evidence of thrombus in the atrial cavity or  appendage. No evidence of thrombus in the atrial cavity or  appendage. - Right atrium: No evidence of thrombus in the atrial cavity or  appendage. - Atrial septum: No defect or patent foramen ovale was identified  on saline microcavitation study. - Tricuspid valve: There was no significant regurgitation.  Diagnostic transesophageal echocardiography. 2D and color Doppler. Birthdate: Patient birthdate: August 10, 1961. Age: Patient is 55 yr old. Sex: Gender: male. BMI: 27.6 kg/m^2. Blood pressure: 130/93 Patient status: Inpatient. Study date: Study date: 02/11/2016. Study time: 10:57 AM. Location:  Endoscopy.  -------------------------------------------------------------------  ------------------------------------------------------------------- Left ventricle: Systolic function was normal. The estimated ejection  fraction was in the range of 60% to 65%. Wall motion was normal; there were no regional wall motion abnormalities.  ------------------------------------------------------------------- Aortic valve: Structurally normal valve. Trileaflet; normal thickness leaflets. Cusp separation was normal. Doppler: There was no regurgitation.  ------------------------------------------------------------------- Aorta: The aorta was normal, not dilated, and non-diseased. There was no atheroma. There was no evidence for dissection. Aortic root: The aortic root was not dilated. Ascending aorta: The ascending aorta was normal in size. Aortic arch: The aortic arch was normal in size. Descending aorta: The descending aorta was normal in size.  ------------------------------------------------------------------- Mitral valve: Leaflet separation was normal. Mild prolapse, involving the middle scallop of the posterior leaflet. Doppler: There was moderate to severe regurgitation directed eccentrically and anteriorly. Valve area by continuity equation (using LVOT flow): 2.55 cm^2. Indexed valve area by continuity equation (using LVOT flow): 1.24 cm^2/m^2. Mean gradient (D): 1 mm Hg.  ------------------------------------------------------------------- Left atrium: The atrium was normal in size. No evidence of thrombus in the atrial cavity or appendage. No evidence of thrombus in the atrial cavity or appendage. The appendage was morphologically a left appendage, multilobulated, and of normal size. Emptying velocity was normal.  ------------------------------------------------------------------- Atrial septum: No defect or patent foramen ovale was identified on saline  microcavitation study.  ------------------------------------------------------------------- Right ventricle: The cavity size was normal. Wall thickness was normal. Systolic function was normal.  ------------------------------------------------------------------- Pulmonic valve: Structurally normal valve.  ------------------------------------------------------------------- Tricuspid valve: Structurally normal valve. Leaflet separation was normal. Doppler: There was no significant regurgitation.  ------------------------------------------------------------------- Pulmonary artery: The main pulmonary artery was normal-sized.  ------------------------------------------------------------------- Right atrium: The atrium was normal in size. No evidence of thrombus in the atrial cavity or appendage. The appendage was morphologically a right appendage.  ------------------------------------------------------------------- Pericardium: There was no pericardial effusion.  ------------------------------------------------------------------- Post procedure conclusions Ascending Aorta:  - The aorta was normal, not dilated, and non-diseased.  ------------------------------------------------------------------- Measurements  Left ventricle Value Stroke volume, 2D 52 ml Stroke volume/bsa, 2D 25 ml/m^2  LVOT Value LVOT ID, S 28 mm LVOT area 6.16 cm^2 LVOT peak velocity, S 44.2 cm/s LVOT mean velocity, S 29.3 cm/s LVOT VTI, S 8.41 cm  Mitral valve Value Mitral mean velocity, D 45.4 cm/s Mitral mean gradient, D  1 mm Hg Mitral valve area, LVOT continuity 2.55 cm^2 Mitral valve area/bsa, LVOT continuity 1.24 cm^2/m^2 Mitral annulus VTI, D 20.3 cm Mitral regurg VTI, PISA 144 cm Mitral ERO, PISA 0.33 cm^2 Mitral regurg volume, PISA 48 ml  Legend: (L) and (H) mark values outside specified reference range.  ------------------------------------------------------------------- Prepared and Electronically Authenticated by  [name], MD 2017-04-05T17:33:13   Impression:  Patient has at least stage C and possibly early stage D severe primary mitral regurgitation. I have personally reviewed the transesophageal echocardiogram performed recently by Dr. Oval Linsey. There is mild bileaflet prolapse with severe prolapse involving the posterior leaflet. I do not see any clear evidence for ruptured chordae tendineae and the jet of regurgitation is both eccentric and bidirectional.  The severity of regurgitation meets criteria for being severe with regurgitant volume estimated 48 mL per beat.  Left ventricular size and systolic function remains normal. There is no significant leaflet or subvalvular calcification. Anatomical characteristics appear favorable for valve repair.  Options include continued medical therapy with close follow-up versus proceeding with elective valve repair in the near future. Although the patient has not yet undergone diagnostic cardiac catheterization I would anticipate that he would likely be a good candidate for minimally invasive approach for surgery.    Plan:  The  patient was counseled at length regarding the indications, risks and potential benefits of mitral valve repair.  The rationale for elective surgery has been explained, including a comparison between surgery and continued medical therapy with close follow-up.  The likelihood of successful and durable  valve repair has been discussed with particular reference to the findings of their recent echocardiogram.  Based upon these findings and previous experience, I have quoted them a greater than 95 percent likelihood of successful valve repair.  All of his questions have been addressed. The patient wants to consider matters further but is contemplating proceeding with elective surgery at some point early this summer. He will look into personal arrangements and call us back when he is ready to make final plans. Once a decision has been made to proceed with surgery we will make arrangements for him to undergo diagnostic cardiac catheterization and CT angiography. All of his questions have been addressed.  I spent in excess of 15 minutes during the conduct of this office consultation and >50% of this time involved direct face-to-face encounter with the patient for counseling and/or coordination of their care.    Valentina Gu. Roxy Manns, MD 02/23/2016 11:51 AM

## 2016-03-03 ENCOUNTER — Other Ambulatory Visit: Payer: Self-pay | Admitting: *Deleted

## 2016-03-03 DIAGNOSIS — I340 Nonrheumatic mitral (valve) insufficiency: Secondary | ICD-10-CM

## 2016-03-10 ENCOUNTER — Other Ambulatory Visit: Payer: Self-pay | Admitting: Family Medicine

## 2016-03-10 NOTE — Telephone Encounter (Signed)
OK 

## 2016-03-10 NOTE — Telephone Encounter (Signed)
Is this okay to refill? 

## 2016-03-11 NOTE — Telephone Encounter (Signed)
Called in med to pharmacy  

## 2016-03-12 ENCOUNTER — Telehealth: Payer: Self-pay | Admitting: Cardiovascular Disease

## 2016-03-12 NOTE — Telephone Encounter (Signed)
Spoke with pt and she said he was supposed to call Lauren back to schedule a heart cath with Dr Burt Knack.  Will forward to Theodosia Quay for clarification is possible.

## 2016-03-12 NOTE — Telephone Encounter (Signed)
New Message  Pt stated he was calling to sched heart cath. Please call back and discuss.

## 2016-03-15 NOTE — Telephone Encounter (Signed)
Left message on machine for pt to contact the office in regards to scheduling cardiac catheterization.

## 2016-03-17 ENCOUNTER — Telehealth: Payer: Self-pay | Admitting: Cardiovascular Disease

## 2016-03-17 NOTE — Telephone Encounter (Signed)
OK thank you.  If he is unable to pay the fee I will take care of it.

## 2016-03-17 NOTE — Telephone Encounter (Signed)
Message routed to [facility] Dr. Oval Linsey If so, patient will need to pay fee for FMLA to be completed thru healthport - arranged with medical records

## 2016-03-17 NOTE — Telephone Encounter (Signed)
Pt wants to know if Dr Oval Linsey will do his FMLA papers? If he is not there,please leave him a message.

## 2016-03-18 ENCOUNTER — Encounter: Payer: Self-pay | Admitting: Cardiovascular Disease

## 2016-03-18 NOTE — Telephone Encounter (Signed)
This encounter was created in error - please disregard.

## 2016-03-18 NOTE — Telephone Encounter (Signed)
Leonard Bentley at 03/18/2016 12:07 PM     Status: Signed       Expand All Collapse All   Pt calling c/o scheduling Cath, would like after June 2's CT and prefers afternoons? pls call            I spoke with the pt and he would like to have procedure on 6/8/217.  Cardiac catheterization scheduled and pt given pre-procedure instructions by phone.  Procedure also explained to the patient.

## 2016-03-18 NOTE — Telephone Encounter (Signed)
Pt calling c/o scheduling Cath, would like after June 2's CT and prefers afternoons? pls call

## 2016-03-19 NOTE — Telephone Encounter (Signed)
Left message to call back  

## 2016-03-23 ENCOUNTER — Telehealth: Payer: Self-pay | Admitting: Cardiovascular Disease

## 2016-03-23 NOTE — Telephone Encounter (Signed)
Pt calling to see is he needs to keep 04-07-16 appt or reschedule to after his test-also checking on status of FMLA

## 2016-03-24 NOTE — Telephone Encounter (Signed)
Left message to call back  

## 2016-04-02 ENCOUNTER — Observation Stay (HOSPITAL_COMMUNITY): Payer: No Typology Code available for payment source

## 2016-04-02 ENCOUNTER — Encounter (HOSPITAL_COMMUNITY): Payer: Self-pay | Admitting: *Deleted

## 2016-04-02 ENCOUNTER — Emergency Department (HOSPITAL_COMMUNITY): Payer: No Typology Code available for payment source

## 2016-04-02 ENCOUNTER — Observation Stay (HOSPITAL_COMMUNITY)
Admission: EM | Admit: 2016-04-02 | Discharge: 2016-04-03 | Disposition: A | Discharge status: Home / Self Care | Payer: No Typology Code available for payment source | Attending: Internal Medicine | Admitting: Internal Medicine

## 2016-04-02 ENCOUNTER — Other Ambulatory Visit: Payer: Self-pay

## 2016-04-02 DIAGNOSIS — I340 Nonrheumatic mitral (valve) insufficiency: Secondary | ICD-10-CM | POA: Insufficient documentation

## 2016-04-02 DIAGNOSIS — I341 Nonrheumatic mitral (valve) prolapse: Secondary | ICD-10-CM | POA: Diagnosis not present

## 2016-04-02 DIAGNOSIS — D72819 Decreased white blood cell count, unspecified: Secondary | ICD-10-CM | POA: Diagnosis present

## 2016-04-02 DIAGNOSIS — F411 Generalized anxiety disorder: Secondary | ICD-10-CM | POA: Diagnosis not present

## 2016-04-02 DIAGNOSIS — I441 Atrioventricular block, second degree: Secondary | ICD-10-CM | POA: Diagnosis not present

## 2016-04-02 DIAGNOSIS — F410 Panic disorder [episodic paroxysmal anxiety] without agoraphobia: Secondary | ICD-10-CM | POA: Diagnosis not present

## 2016-04-02 DIAGNOSIS — Z8572 Personal history of non-Hodgkin lymphomas: Secondary | ICD-10-CM

## 2016-04-02 DIAGNOSIS — Z9221 Personal history of antineoplastic chemotherapy: Secondary | ICD-10-CM | POA: Insufficient documentation

## 2016-04-02 DIAGNOSIS — R200 Anesthesia of skin: Secondary | ICD-10-CM | POA: Diagnosis not present

## 2016-04-02 DIAGNOSIS — Z86711 Personal history of pulmonary embolism: Secondary | ICD-10-CM | POA: Insufficient documentation

## 2016-04-02 DIAGNOSIS — G459 Transient cerebral ischemic attack, unspecified: Secondary | ICD-10-CM | POA: Diagnosis not present

## 2016-04-02 DIAGNOSIS — I471 Supraventricular tachycardia: Secondary | ICD-10-CM | POA: Diagnosis present

## 2016-04-02 DIAGNOSIS — I4891 Unspecified atrial fibrillation: Secondary | ICD-10-CM | POA: Insufficient documentation

## 2016-04-02 DIAGNOSIS — E785 Hyperlipidemia, unspecified: Secondary | ICD-10-CM | POA: Diagnosis not present

## 2016-04-02 DIAGNOSIS — D61818 Other pancytopenia: Secondary | ICD-10-CM | POA: Diagnosis not present

## 2016-04-02 DIAGNOSIS — M1990 Unspecified osteoarthritis, unspecified site: Secondary | ICD-10-CM | POA: Diagnosis present

## 2016-04-02 DIAGNOSIS — Z87891 Personal history of nicotine dependence: Secondary | ICD-10-CM | POA: Diagnosis not present

## 2016-04-02 DIAGNOSIS — T451X5A Adverse effect of antineoplastic and immunosuppressive drugs, initial encounter: Secondary | ICD-10-CM | POA: Diagnosis not present

## 2016-04-02 DIAGNOSIS — I10 Essential (primary) hypertension: Secondary | ICD-10-CM | POA: Insufficient documentation

## 2016-04-02 DIAGNOSIS — D649 Anemia, unspecified: Secondary | ICD-10-CM | POA: Diagnosis present

## 2016-04-02 DIAGNOSIS — G620 Drug-induced polyneuropathy: Secondary | ICD-10-CM | POA: Diagnosis present

## 2016-04-02 DIAGNOSIS — I480 Paroxysmal atrial fibrillation: Secondary | ICD-10-CM | POA: Diagnosis not present

## 2016-04-02 DIAGNOSIS — F419 Anxiety disorder, unspecified: Secondary | ICD-10-CM | POA: Insufficient documentation

## 2016-04-02 DIAGNOSIS — G629 Polyneuropathy, unspecified: Secondary | ICD-10-CM | POA: Diagnosis not present

## 2016-04-02 LAB — BASIC METABOLIC PANEL
Anion gap: 10 (ref 5–15)
BUN: 13 mg/dL (ref 6–20)
CO2: 27 mmol/L (ref 22–32)
Calcium: 9.4 mg/dL (ref 8.9–10.3)
Chloride: 102 mmol/L (ref 101–111)
Creatinine, Ser: 0.96 mg/dL (ref 0.61–1.24)
GFR calc Af Amer: >60 mL/min (ref >60)
GFR calc non Af Amer: >60 mL/min (ref >60)
Glucose, Bld: 109 mg/dL — ABNORMAL HIGH (ref 65–99)
Potassium: 4.2 mmol/L (ref 3.5–5.1)
Sodium: 139 mmol/L (ref 135–145)

## 2016-04-02 LAB — RAPID URINE DRUG SCREEN, HOSP PERFORMED
Amphetamines: NOT DETECTED
Barbiturates: NOT DETECTED
Benzodiazepines: NOT DETECTED
Cocaine: NOT DETECTED
Opiates: NOT DETECTED
Tetrahydrocannabinol: NOT DETECTED

## 2016-04-02 LAB — CBC
HCT: 37.8 % — ABNORMAL LOW (ref 39.0–52.0)
Hemoglobin: 12.1 g/dL — ABNORMAL LOW (ref 13.0–17.0)
MCH: 25.5 pg — ABNORMAL LOW (ref 26.0–34.0)
MCHC: 32 g/dL (ref 30.0–36.0)
MCV: 79.6 fL (ref 78.0–100.0)
Platelets: 125 10*3/uL — ABNORMAL LOW (ref 150–400)
RBC: 4.75 MIL/uL (ref 4.22–5.81)
RDW: 14.6 % (ref 11.5–15.5)
WBC: 3.3 10*3/uL — ABNORMAL LOW (ref 4.0–10.5)

## 2016-04-02 LAB — I-STAT TROPONIN, ED: Troponin i, poc: 0 ng/mL (ref 0.00–0.08)

## 2016-04-02 LAB — VITAMIN B12: Vitamin B-12: 901 pg/mL (ref 180–914)

## 2016-04-02 LAB — FOLATE: Folate: 11.1 ng/mL (ref >5.9)

## 2016-04-02 MED ORDER — LORAZEPAM 2 MG/ML IJ SOLN
1.0000 mg | Freq: Four times a day (QID) | INTRAMUSCULAR | Status: DC | PRN
  Administered 2016-04-02: 1 mg via INTRAVENOUS
  Filled 2016-04-02: qty 1

## 2016-04-02 MED ORDER — LORAZEPAM 1 MG PO TABS
1.0000 mg | ORAL_TABLET | Freq: Once | ORAL | Status: AC
  Administered 2016-04-02: 1 mg via ORAL
  Filled 2016-04-02: qty 1

## 2016-04-02 MED ORDER — LORAZEPAM 1 MG PO TABS: 1.0000 mg | ORAL_TABLET | Freq: Four times a day (QID) | ORAL | Status: DC | PRN

## 2016-04-02 MED ORDER — FOLIC ACID 1 MG PO TABS
1.0000 mg | ORAL_TABLET | Freq: Every day | ORAL | Status: DC
  Administered 2016-04-03: 1 mg via ORAL
  Filled 2016-04-02: qty 1

## 2016-04-02 MED ORDER — STROKE: EARLY STAGES OF RECOVERY BOOK: Freq: Once | Status: DC

## 2016-04-02 MED ORDER — ADULT MULTIVITAMIN W/MINERALS CH
1.0000 | ORAL_TABLET | Freq: Every day | ORAL | Status: DC
Start: 2016-04-02 — End: 2016-04-03
  Administered 2016-04-03: 1 via ORAL
  Filled 2016-04-02: qty 1

## 2016-04-02 MED ORDER — LORAZEPAM 1 MG PO TABS
0.0000 mg | ORAL_TABLET | Freq: Two times a day (BID) | ORAL | Status: DC
Start: 2016-04-04 — End: 2016-04-03

## 2016-04-02 MED ORDER — ENOXAPARIN SODIUM 40 MG/0.4ML ~~LOC~~ SOLN
40.0000 mg | SUBCUTANEOUS | Status: DC
  Administered 2016-04-02: 40 mg via SUBCUTANEOUS
  Filled 2016-04-02: qty 0.4

## 2016-04-02 MED ORDER — LORAZEPAM 1 MG PO TABS
0.0000 mg | ORAL_TABLET | Freq: Four times a day (QID) | ORAL | Status: DC
  Administered 2016-04-02: 1 mg via ORAL
  Filled 2016-04-02: qty 1

## 2016-04-02 MED ORDER — VITAMIN B-1 100 MG PO TABS
100.0000 mg | ORAL_TABLET | Freq: Every day | ORAL | Status: DC
Start: 2016-04-02 — End: 2016-04-03
  Administered 2016-04-03: 100 mg via ORAL
  Filled 2016-04-02: qty 1

## 2016-04-02 MED ORDER — LORAZEPAM 1 MG PO TABS: 1.0000 mg | ORAL_TABLET | Freq: Once | ORAL | Status: DC

## 2016-04-02 MED ORDER — BUSPIRONE HCL 15 MG PO TABS
7.5000 mg | ORAL_TABLET | Freq: Two times a day (BID) | ORAL | Status: DC
  Administered 2016-04-02 – 2016-04-03 (×2): 7.5 mg via ORAL
  Filled 2016-04-02 (×4): qty 1

## 2016-04-02 MED ORDER — THIAMINE HCL 100 MG/ML IJ SOLN: 100.0000 mg | Freq: Every day | INTRAMUSCULAR | Status: DC

## 2016-04-02 MED ORDER — NICOTINE 21 MG/24HR TD PT24
21.0000 mg | MEDICATED_PATCH | Freq: Every day | TRANSDERMAL | Status: DC
  Filled 2016-04-02: qty 1

## 2016-04-02 MED ORDER — METOPROLOL TARTRATE 12.5 MG HALF TABLET
12.5000 mg | ORAL_TABLET | Freq: Two times a day (BID) | ORAL | Status: DC
  Administered 2016-04-02 – 2016-04-03 (×2): 12.5 mg via ORAL
  Filled 2016-04-02 (×2): qty 1

## 2016-04-02 MED ORDER — LORAZEPAM 2 MG/ML IJ SOLN
1.0000 mg | Freq: Once | INTRAMUSCULAR | Status: AC
  Administered 2016-04-02: 1 mg via INTRAVENOUS
  Filled 2016-04-02: qty 1

## 2016-04-02 NOTE — ED Provider Notes (Signed)
CSN: [id number]     Arrival date & time 04/02/16  [id number] History   First MD Initiated Contact with Patient 04/02/16 0940     Chief Complaint  Patient presents with  . Chest Pain  . Numbness    L ARM and FACE     (Consider location/radiation/quality/duration/timing/severity/associated sxs/prior Treatment) Patient is a 55 y.o. male presenting with chest pain. The history is provided by the patient.  Chest Pain Associated symptoms: diaphoresis, fatigue, palpitations and weakness   Associated symptoms: no abdominal pain, no back pain and no shortness of breath   Patient presents with feeling like he was walking through water. States he had some shortness of breath and fatigue with it too. He has bad mitral regurg and is due for likely surgery but is due to have a catheterization first. Slight chest pain episode today. Also felt his heart racing. Was reportedly diaphoretic. States he has had over the last few weeks episodes of his left side feeling numb. Since he felt numb everywhere today. History of atrial tachycardia. Patient drinks about 1 bottle wine a Rufer.Patient states he tends to get anxious and have the left-sided tingling.  Past Medical History  Diagnosis Date  . ED (erectile dysfunction)   . Pulmonary emboli ([place])   . Depression   . Cancer ([place]) 07/2011    STAGE II NON- HODGKIN LYMPHOMA  . Lymphoma, non Hodgkin's 09/25/2011  . Mobitz I 10/09/2015  . PAC (premature atrial contraction) 10/09/2015  . Atrial tachycardia ([place]) 01/06/2016  . Murmur 01/06/2016  . Mitral regurgitation 01/20/2016   Past Surgical History  Procedure Laterality Date  . No past surgeries    . Trigger finger release Bilateral   . Tee without cardioversion N/A 02/11/2016    Procedure: TRANSESOPHAGEAL ECHOCARDIOGRAM (TEE);  Surgeon: [name], MD;  Location: [facility] ENDOSCOPY;  Service: Cardiovascular;  Laterality: N/A;   Family History  Problem Relation Age of Onset  . Cancer Mother     BREAST(BONE)  .  Cancer Father     PANCREATIC   Social History  Substance Use Topics  . Smoking status: Former Smoker -- 1.00 packs/[place] for 32 years    Types: Cigarettes    Quit date: 07/06/2013  . Smokeless tobacco: Current User  . Alcohol Use: 0.0 oz/week    0 Standard drinks or equivalent per week     Comment:  1 bottle of wine    Review of Systems  Constitutional: Positive for diaphoresis and fatigue. Negative for appetite change.  Respiratory: Negative for shortness of breath.   Cardiovascular: Positive for chest pain and palpitations.  Gastrointestinal: Negative for abdominal pain.  Musculoskeletal: Negative for back pain.  Skin: Negative for wound.  Neurological: Positive for tremors and weakness. Negative for seizures.      Allergies  Review of patient's allergies indicates no known allergies.  Home Medications   Prior to Admission medications   Medication Sig Start Date End Date Taking? Authorizing Provider  ALPRAZolam Duanne Moron) 0.25 MG tablet TAKE ONE TABLET BY MOUTH TWICE DAILY AS NEEDED FOR ANXIETY 03/10/16  Yes [name], MD  metoprolol tartrate (LOPRESSOR) 25 MG tablet Take 0.5 tablets (12.5 mg total) by mouth 2 (two) times daily. 01/05/16  Yes [name], MD   BP 109/83 mmHg  Pulse 68  Temp(Src) 98 F (36.7 C) (Oral)  Resp 18  Ht 5\' 9"  (1.753 m)  Wt 190 lb (86.183 kg)  BMI 28.05 kg/m2  SpO2 97% Physical Exam  Constitutional: He is oriented  to person, place, and time. He appears well-developed.  HENT:  Head: Atraumatic.  Neck: Neck supple.  Cardiovascular: Normal rate.   Pulmonary/Chest: Effort normal.  Abdominal: Soft. There is no tenderness.  Musculoskeletal: He exhibits no edema or tenderness.  Neurological: He is alert and oriented to person, place, and time.  Bilateral upper extremity tremors. Good grips bilaterally. Good flexion-extension upper extremity's. Finger-nose intact bilaterally.  Skin: Skin is warm.    ED Course  Procedures (including  critical care time) Labs Review Labs Reviewed  BASIC METABOLIC PANEL - Abnormal; Notable for the following:    Glucose, Bld 109 (*)    All other components within normal limits  CBC - Abnormal; Notable for the following:    WBC 3.3 (*)    Hemoglobin 12.1 (*)    HCT 37.8 (*)    MCH 25.5 (*)    Platelets 125 (*)    All other components within normal limits  URINE RAPID DRUG SCREEN, HOSP PERFORMED  I-STAT TROPOININ, ED    Imaging Review Dg Chest 2 View  04/02/2016  CLINICAL DATA:  Chest pain, palpitations. EXAM: CHEST  2 VIEW COMPARISON:  Chest CT 01/08/2015.  Chest x-ray 01/30/2013. FINDINGS: Heart and mediastinal contours are within normal limits. No focal opacities or effusions. No acute bony abnormality. IMPRESSION: No active cardiopulmonary disease. Electronically Signed   By: Rolm Baptise M.D.   On: 04/02/2016 10:56   I have personally reviewed and evaluated these images and lab results as part of my medical decision-making.   EKG Interpretation   Date/Time:  Friday Apr 02 2016 09:52:16 EDT Ventricular Rate:  125 PR Interval:  141 QRS Duration: 89 QT Interval:  339 QTC Calculation: 489 R Axis:   -59 Text Interpretation:  Atrial fibrillation  vs atrial tacnycardia and sinus  rhythm LAD, consider left anterior fascicular block Abnormal R-wave  progression, early transition Borderline prolonged QT interval Reconfirmed  by Alvino Chapel  MD, Ovid Curd [phone]) on 04/02/2016 10:04:32 AM      MDM   Final diagnoses:  Multifocal atrial tachycardia (HCC)  Left sided numbness    Patient presented with the fast heart rate episodes of anxiety. At times he does have left-sided numbness. Now he has some diffuse neuropathies which is not unusual for the patient. Seen by cardiology neurology. Some question of A. fib versus multifocal atrial tachycardia. May need increased beta blocker. Also has a severe mitral regurg and is likely due for surgery. Admit to medicine for TIA rule  out.    Davonna Belling, MD 04/02/16 1257

## 2016-04-02 NOTE — ED Notes (Signed)
Per EMS- Pt arrives from home c/o left sided chest pain, pt recently diagnosed with A-Fib and is on metoprolol for rate control, pt also c/o intermittent left facial and arm numbness for the past two weeks. Pt c/o n/v, diaphoresis, back pain, weakness but denies diarrhea.

## 2016-04-02 NOTE — Care Management Note (Signed)
Case Management Note  Patient Details  Name: Leonard Bentley MRN: [id number] Date of Birth: 02/04/1961  Subjective/Objective:    Pt arrives from home c/o left sided chest pain, pt recently diagnosed with A-Fib and is on metoprolol for rate control, pt also c/o intermittent left facial and arm numbness for the past two weeks. Leonard Bentley alone.                Action/Plan: Follow for disposition needs.   Expected Discharge Date:  04/04/16               Expected Discharge Plan:  Home/Self Care  In-House Referral:     Discharge planning Services  CM Consult  Post Acute Care Choice:    Choice offered to:     DME Arranged:    DME Agency:     HH Arranged:    HH Agency:     Status of Service:  In process, will continue to follow  Medicare Important Message Given:    Date Medicare IM Given:    Medicare IM give by:    Date Additional Medicare IM Given:    Additional Medicare Important Message give by:     If discussed at [place] of Stay Meetings, dates discussed:    Additional Comments:  Fuller Mandril, RN 04/02/2016, 1:54 PM

## 2016-04-02 NOTE — ED Notes (Signed)
Pt went to MRI.

## 2016-04-02 NOTE — Consult Note (Signed)
CARDIOLOGY CONSULT NOTE   Patient ID: Leonard Bentley MRN: [id number] DOB/AGE: 56-Jun-1962 56 y.o.  Admit date: 04/02/2016  Primary Physician   Wyatt Haste, MD Primary Cardiologist: Dr. Oval Linsey Requesting MD: Dr. Alvino Chapel Reason for Consultation: Afib  HPI: Leonard Bentley is a 55 year old male with a past medical history of atrial tachycardia and mitral regurgitation. No prior history of coronary artery disease.  He presents today to the emergency department with complaints of general malaise, and left sided extremity numbness. He woke up this morning to go on his usual run and noticed that his left foot was numb. He suffers from neuropathy so having numbness in his foot is not unusual however it was more pronounced today. Also his left arm was numb. He got up and went for his run and ran for about 20 minutes but stopped due to the numbness. He also felt nauseous and vomited. He says that he feels like he was moving through water and that his reactions were delayed. He attributes this to a panic attack that he had this morning. He says that his chest hurt briefly when he would take a deep breath but this is no longer present. No diaphoresis or shortness of breath.  Upon arrival to the emergency department, his EKG showed sinus tachycardia versus atrial tachycardia versus atrial fibrillation. He reports feeling palpitations all morning.   He also has a history of severe mitral regurgitation for which he was seen in March of this year for surgical consultation with Dr. Roxy Manns. Dr. Roxy Manns notes that patient had reported frequent episodes of palpitations, dizzy spells and exertional shortness of breath. In November 2016 he was seen by his primary care physician and was noted to be in a Mobitz type I second-degree AV block. He was referred to cardiology and underwent a nuclear stress test that was found to be low risk with a resting ejection fraction of 52% and no perfusion abnormalities during stress.  He was seen by Dr. Oval Linsey in February of this year and reported fatigue and increasing palpitations at that time his EKG showed nonconducted PACs. He was started on metoprolol. Echo was done in March 2017 and revealed mitral valve prolapse with moderate to severe mitral regurgitation. His EF was 60-65%, and there was severe left atrial enlargement. Surgical intervention was offered to the patient and he plans to have surgery. He has cardiac catheterization planned for early June.  He remains fairly active at home, gets exercise every Venkatesh. He works at [employer]. Currently, he is highly anxious, tearful and shaking. He suffers from anxiety and panic attacks.    Past Medical History  Diagnosis Date  . ED (erectile dysfunction)   . Pulmonary emboli ([place])   . Depression   . Cancer ([place]) 07/2011    STAGE II NON- HODGKIN LYMPHOMA  . Lymphoma, non Hodgkin's 09/25/2011  . Mobitz I 10/09/2015  . PAC (premature atrial contraction) 10/09/2015  . Atrial tachycardia ([place]) 01/06/2016  . Murmur 01/06/2016  . Mitral regurgitation 01/20/2016     Past Surgical History  Procedure Laterality Date  . No past surgeries    . Trigger finger release Bilateral   . Tee without cardioversion N/A 02/11/2016    Procedure: TRANSESOPHAGEAL ECHOCARDIOGRAM (TEE);  Surgeon: [name], MD;  Location: [facility] ENDOSCOPY;  Service: Cardiovascular;  Laterality: N/A;    No Known Allergies  I have reviewed the patient's current medications     Prior to Admission medications   Medication  Sig Start Date End Date Taking? Authorizing Provider  ALPRAZolam Duanne Moron) 0.25 MG tablet TAKE ONE TABLET BY MOUTH TWICE DAILY AS NEEDED FOR ANXIETY 03/10/16  Yes [name], MD  metoprolol tartrate (LOPRESSOR) 25 MG tablet Take 0.5 tablets (12.5 mg total) by mouth 2 (two) times daily. 01/05/16  Yes [name], MD     Social History   Social History  . Marital Status: Single    Spouse Name: N/A    . Number of Children: N/A  . Years of Education: N/A   Occupational History  . Not on file.   Social History Main Topics  . Smoking status: Former Smoker -- 1.00 packs/[place] for 32 years    Types: Cigarettes    Quit date: 07/06/2013  . Smokeless tobacco: Current User  . Alcohol Use: 0.0 oz/week    0 Standard drinks or equivalent per week     Comment:  1 bottle of wine  . Drug Use: No  . Sexual Activity: No   Other Topics Concern  . Not on file   Social History Narrative   Epworth sleepiness scale as of 10/09/15 a 1    Family Status  Relation Status Death Age  . Mother Deceased 25  . Father Deceased 85  . Maternal Grandmother Deceased   . Maternal Grandfather Deceased   . Sister Deceased UNKNOWN    UNKNOWN  . Brother Alive    Family History  Problem Relation Age of Onset  . Cancer Mother     BREAST(BONE)  . Cancer Father     PANCREATIC     ROS:  Full 14 point review of systems complete and found to be negative unless listed above.  Physical Exam: Blood pressure 102/67, pulse 52, temperature 98 F (36.7 C), temperature source Oral, resp. rate 16, height 5\' 9"  (1.753 m), weight 190 lb (86.183 kg), SpO2 99 %.  General: Well developed, well nourished, male in no acute distress Head: Eyes PERRLA, No xanthomas.   Normocephalic and atraumatic, oropharynx without edema or exudate.  Lungs: CTA Heart: Irregular rate and rhythm with S1, S2. No S3 or S4. 2/6 systolic murmur heard. Pulses +2.    Neck: No carotid bruits. No lymphadenopathy.  No JVD. Abdomen: Bowel sounds present, abdomen soft and non-tender without masses or hernias noted. Msk:  No spine or cva tenderness. No weakness, no joint deformities or effusions. Extremities: No clubbing or cyanosis.  No edema.  Neuro: Alert and oriented X 3. No focal deficits noted. Psych:  Good affect, responds appropriately Skin: No rashes or lesions noted.  Labs:   Lab Results  Component Value Date   WBC 3.3* 04/02/2016   HGB  12.1* 04/02/2016   HCT 37.8* 04/02/2016   MCV 79.6 04/02/2016   PLT 125* 04/02/2016    Recent Labs Lab 04/02/16 0958  NA 139  K 4.2  CL 102  CO2 27  BUN 13  CREATININE 0.96  CALCIUM 9.4  GLUCOSE 109*    Recent Labs  04/02/16 1005  TROPIPOC 0.00    Echo: 01/20/16 - Left ventricle: The cavity size was normal. Wall thickness was  increased in a pattern of moderate LVH. Systolic function was  normal. The estimated ejection fraction was in the range of 60%  to 65%. Wall motion was normal; there were no regional wall  motion abnormalities. The study is not technically sufficient to  allow evaluation of LV diastolic function. - Aortic valve: Sclerosis without stenosis. There was trivial  regurgitation. -  Mitral valve: Mildly thickened leaflets with predominate prolapse  of the posterior leaflet and anteriorly directed mitral  regurgitation. There was moderate to severe eccentric  regurgitation directed at the atrial septum. - Left atrium: Massively dilated at 85 ml/m2. - Tricuspid valve: There was mild regurgitation. - Pulmonary arteries: PA peak pressure: 27 mm Hg (S). - Inferior vena cava: The vessel was normal in size. The  respirophasic diameter changes were in the normal range (>= 50%),  consistent with normal central venous pressure.  Impressions:  - LVEF 60-65%, moderate LVH, trivial AI, moderate to more likely  severe eccentric MR directed anteriorly and to the interatrial  septum, posterior mitral valve leaflet prolpase, massive LAE,  mild TR, RVSP 27 mmHg, normal IVC.   ECG:  Atrial tachycardia   Radiology:  Dg Chest 2 View  04/02/2016  CLINICAL DATA:  Chest pain, palpitations. EXAM: CHEST  2 VIEW COMPARISON:  Chest CT 01/08/2015.  Chest x-ray 01/30/2013. FINDINGS: Heart and mediastinal contours are within normal limits. No focal opacities or effusions. No acute bony abnormality. IMPRESSION: No active cardiopulmonary disease. Electronically  Signed   By: Rolm Baptise M.D.   On: 04/02/2016 10:56    ASSESSMENT AND PLAN:    Active Problems:   * No active hospital problems. *  1. Multifocal atrial tachycardia: Patient presents with palpitations, general malaise. His EKG shows atrial tachycardia, some strips reviewed and there is some question of atrial fibrillation. His EKG from Feb. 2017, shows the same multifocal atrial tachycardia with rate of 66, however currently his rate is in the 100-120's. His left atrium is massively dilated, which is likely contributory to his atrial arrhythmias. He was started on metoprolol in February of this year and has taken his dose today.   His BP is soft with SBP in 90-100's. MD to advise on increasing beta blocker or adding anticoagulation.   2. Left sided numbness: Patient woke up with left foot and left arm numbness, he has a history of neuropathy and review of his chart shows that he has a history of left sided numbness, neurology has been consulted.   3. Mitral valve regurgitation: Patient has been seen by CVTS and has elected for surgical intervention with mitral valve repair. He suffers from dizziness, DOE and frequent feelings of palpitations according to surgical consult note. He has planned cardiac catheterization for 04/15/16 as preoperative evaluation.   Signed: Arbutus Leas, NP 04/02/2016 11:32 AM Pager [phone]  Co-Sign MD Patient seen and examined and history reviewed. Agree with above findings and plan. Patient presented to ED today with complaints of left foot numbness. He tells me it is weak as well but he was able to run on it this am. History of neuropathy. Very anxious. Recently diagnosed with MVP with severe MR. Giant left atrium. Prior history of palpitations and PAT. Noted by EMS to have tachycardia. I have reviewed all EMS tracings and Ecg in ED. He has frequent PACs. There are several tracings with sudden onset tachycardia with P waves preceding QRS consistent with atrial  tachycardia. There is one EMS strip consistent with Afib with irregular rhythm without organized P waves.  Awaiting Neuro evaluation  Impression: Predominant atrial tachycardia with one documented episode of paroxysmal Afib. If Neuro evaluation is benign I would increase metoprolol to 25 mg bid to help with atrial tachycardia. His [place] vasc score is 0 so I would not recommend anticoagulation unless he has a definite Neuro event. He is scheduled for CT chest and  Abd on 6/2 and cardiac cath on 6/8 as prep for MV repair. He has follow up appointment with Dr. Oval Linsey on 5/31. OK to proceed with this evaluation. It would not be surprising for him to develop persistent Afib given his marked LA enlargement.  If Neuro feels this is an acute neurologic event we would need to reassess the above recommendation.  Chalyn Amescua [place], [place] 04/02/2016 12:42 PM

## 2016-04-02 NOTE — Consult Note (Signed)
Requesting Physician: Dr. Alvino Chapel    Chief Complaint: TIA  History obtained from:  Patient     HPI:                                                                                                                                         Leonard Bentley is an 55 y.o. male with new onset Afib while in ED, history of PE, PAC, Atrial tachycardia, mitral regurgitation and anxiety. He states for the last moth he has had intermittent tingling of his face (unilateral and bilateral) left arm (hand) and left foot. At times the tingling will move from face to arm to hand. He notes the sensation often occurs when he is anxious and if this occurs in the morning he will go running which will reslove the sensation. He cannot recall the episodes ever occuring when he is calm. He feels there is associated generalized weakness but at times he feels his left sided is weaker than right --these episodes last for about 30 minutes and have occurred for a few months. . Currently he is asymptomatic. He is very anxious and tremulous.  Upon mentioning MRI he became very anxious and states he will not be able to do this unless he is placed asleep.   States he drinks a bottle of wine a night and no illicit drugs.   Date last known well: Unable to determine Time last known well: Unable to determine tPA Given: No: no symptoms   Past Medical History  Diagnosis Date  . ED (erectile dysfunction)   . Pulmonary emboli ([place])   . Depression   . Cancer ([place]) 07/2011    STAGE II NON- HODGKIN LYMPHOMA  . Lymphoma, non Hodgkin's 09/25/2011  . Mobitz I 10/09/2015  . PAC (premature atrial contraction) 10/09/2015  . Atrial tachycardia ([place]) 01/06/2016  . Murmur 01/06/2016  . Mitral regurgitation 01/20/2016    Past Surgical History  Procedure Laterality Date  . No past surgeries    . Trigger finger release Bilateral   . Tee without cardioversion N/A 02/11/2016    Procedure: TRANSESOPHAGEAL ECHOCARDIOGRAM (TEE);  Surgeon: [name], MD;  Location: [facility] ENDOSCOPY;  Service: Cardiovascular;  Laterality: N/A;    Family History  Problem Relation Age of Onset  . Cancer Mother     BREAST(BONE)  . Cancer Father     PANCREATIC   Social History:  reports that he quit smoking about 2 years ago. His smoking use included Cigarettes. He has a 32 pack-year smoking history. He uses smokeless tobacco. He reports that he drinks alcohol. He reports that he does not use illicit drugs.  Allergies: No Known Allergies  Medications:  Current Facility-Administered Medications  Medication Dose Route Frequency Provider Last Rate Last Dose  . LORazepam (ATIVAN) injection 1 mg  1 mg Intravenous Once Davonna Belling, MD       Current Outpatient Prescriptions  Medication Sig Dispense Refill  . ALPRAZolam (XANAX) 0.25 MG tablet TAKE ONE TABLET BY MOUTH TWICE DAILY AS NEEDED FOR ANXIETY 15 tablet 0  . metoprolol tartrate (LOPRESSOR) 25 MG tablet Take 0.5 tablets (12.5 mg total) by mouth 2 (two) times daily. 60 tablet 3     ROS:                                                                                                                                       History obtained from the patient  General ROS: negative for - chills, fatigue, fever, night sweats, weight gain or weight loss Psychological ROS: negative for - behavioral disorder, hallucinations, memory difficulties, mood swings or suicidal ideation Ophthalmic ROS: negative for - blurry vision, double vision, eye pain or loss of vision ENT ROS: negative for - epistaxis, nasal discharge, oral lesions, sore throat, tinnitus or vertigo Allergy and Immunology ROS: negative for - hives or itchy/watery eyes Hematological and Lymphatic ROS: negative for - bleeding problems, bruising or swollen lymph nodes Endocrine ROS: negative for - galactorrhea, hair pattern  changes, polydipsia/polyuria or temperature intolerance Respiratory ROS: negative for - cough, hemoptysis, shortness of breath or wheezing Cardiovascular ROS: negative for - chest pain, dyspnea on exertion, edema or irregular heartbeat Gastrointestinal ROS: negative for - abdominal pain, diarrhea, hematemesis, nausea/vomiting or stool incontinence Genito-Urinary ROS: negative for - dysuria, hematuria, incontinence or urinary frequency/urgency Musculoskeletal ROS: negative for - joint swelling or muscular weakness Neurological ROS: as noted in HPI Dermatological ROS: negative for rash and skin lesion changes  Neurologic Examination:                                                                                                      Blood pressure 102/67, pulse 52, temperature 98 F (36.7 C), temperature source Oral, resp. rate 16, height 5\' 9"  (1.753 m), weight 86.183 kg (190 lb), SpO2 99 %.  HEENT-  Normocephalic, no lesions, without obvious abnormality.  Normal external eye and conjunctiva.  Normal TM's bilaterally.  Normal auditory canals and external ears. Normal external nose, mucus membranes and septum.  Normal pharynx. Cardiovascular- irregularly irregular rhythm, pulses palpable throughout   Lungs- chest clear, no wheezing, rales, normal symmetric air entry Abdomen- normal findings: bowel sounds normal Extremities-  no edema Lymph-no adenopathy palpable Musculoskeletal-no joint tenderness, deformity or swelling Skin-warm and dry, no hyperpigmentation, vitiligo, or suspicious lesions  Neurological Examination Mental Status: Alert, oriented, thought content appropriate.  Speech fluent without evidence of aphasia.  Able to follow 3 step commands without difficulty. Cranial Nerves: II:  Visual fields grossly normal, pupils equal, round, reactive to light and accommodation III,IV, VI: ptosis not present, extra-ocular motions intact bilaterally V,VII: smile symmetric, facial light touch  sensation normal bilaterally VIII: hearing normal bilaterally IX,X: uvula rises symmetrically XI: bilateral shoulder shrug XII: midline tongue extension Motor: Right : Upper extremity   5/5    Left:     Upper extremity   5/5  Lower extremity   5/5     Lower extremity   5/5 Very anxious and tremulous Sensory: Pinprick and light touch intact throughout, bilaterally Deep Tendon Reflexes: 2+ and symmetric throughout Plantars: Right: downgoing   Left: downgoing Cerebellar: normal finger-to-nose with tremor but no past pointing, and normal heel-to-shin test with tremor Gait: normal gait and station       Lab Results: Basic Metabolic Panel:  Recent Labs Lab 04/02/16 0958  NA 139  K 4.2  CL 102  CO2 27  GLUCOSE 109*  BUN 13  CREATININE 0.96  CALCIUM 9.4    Liver Function Tests: No results for input(s): AST, ALT, ALKPHOS, BILITOT, PROT, ALBUMIN in the last 168 hours. No results for input(s): LIPASE, AMYLASE in the last 168 hours. No results for input(s): AMMONIA in the last 168 hours.  CBC:  Recent Labs Lab 04/02/16 0958  WBC 3.3*  HGB 12.1*  HCT 37.8*  MCV 79.6  PLT 125*    Cardiac Enzymes: No results for input(s): CKTOTAL, CKMB, CKMBINDEX, TROPONINI in the last 168 hours.  Lipid Panel: No results for input(s): CHOL, TRIG, HDL, CHOLHDL, VLDL, LDLCALC in the last 168 hours.  CBG: No results for input(s): GLUCAP in the last 168 hours.  Microbiology: No results found for this or any previous visit.  Coagulation Studies: No results for input(s): LABPROT, INR in the last 72 hours.  Imaging: Dg Chest 2 View  04/02/2016  CLINICAL DATA:  Chest pain, palpitations. EXAM: CHEST  2 VIEW COMPARISON:  Chest CT 01/08/2015.  Chest x-ray 01/30/2013. FINDINGS: Heart and mediastinal contours are within normal limits. No focal opacities or effusions. No acute bony abnormality. IMPRESSION: No active cardiopulmonary disease. Electronically Signed   By: Rolm Baptise M.D.   On:  04/02/2016 10:56       Assessment and plan discussed with with attending physician and they are in agreement.    [name] PA-C [facility] [phone]  04/02/2016, 11:55 AM   Assessment: 55 y.o. male with intermittent left-sided numbness and weakness over the past several weeks. 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 He describes numbness and bilateral ulnar distributions as well as bilateral feet, left worse than right. This is been long-standing issue. He complains today of generalized sensation of malaise and weakness, and in this setting his left foot appears weaker. I suspect that all of this is essentially worsening of chronic neurological conditions due to physiological stressor.  The events that I find more concerning are intermittent episodes that occurred several times in the past couple of months focal left arm leg and face weakness that last for 20-30 minutes before improving. I do think that these are concerning for TIA.  Stroke Risk Factors - atrial fibrillation  1. HgbA1c, fasting lipid panel 2. MRI of  the brain without contrast 3. Frequent neuro checks 4. Echocardiogram 5. MR angiogram of the head and neck 6. Prophylactic therapy-Antiplatelet med: Aspirin - dose 325mg  PO or 300mg  PR 7. Risk factor modification 8. Telemetry monitoring 9. PT consult, OT consult, Speech consult 10. please page stroke NP  Or  PA  Or MD  M-F from 8am -4 pm starting 5/27 as this patient will be followed by the stroke team at this point.   You can look them up on [email or web address]    Roland Rack, MD [facility] [phone]  If 7pm- 7am, please page neurology on call as listed in [place].

## 2016-04-02 NOTE — Progress Notes (Signed)
Pt arrived to 55M via stretcher.  Pt ambulated from stretcher to bed.  No c/o pain at this time.  Telemetry applied and CCMD notified.  VSS.  Will continue to monitor. Cori Razor, RN

## 2016-04-02 NOTE — ED Notes (Signed)
Pt also c/o stomach cramping

## 2016-04-02 NOTE — ED Notes (Signed)
MD at bedside. 

## 2016-04-02 NOTE — H&P (Signed)
History and Physical    Leonard Bentley DOB: 07-23-1961 DOA: 04/02/2016   PCP: Wyatt Haste, MD   Patient coming from/Resides with: Private residence/lives alone  Chief Complaint: Chest pain numbness left arm and face  HPI: Leonard Bentley is a 55 y.o. male with medical history significant for non-Hodgkin's lymphoma status post systemic chemotherapy with CHOP/Rituxan with last dose January 2013, known history of severe mitral regurgitation evaluated as an outpatient by CVTS, no nature of tachycardia, recent issues of fatigue and increasing palpitations, history of PE, depression and anxiety who presented to the ER with reports of chest pain but primarily complaining of left-sided numbness. 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 He reported to the neurology PA that for the past month he has been experiencing intermittent tingling of his face with both unilateral and bilateral well as similar sensations in the left hand and left foot and the symptoms appeared to be associated with onset of increased anxiety. While in the ER he has been evaluated not only by the EDP but by neurology and cardiology and all providers have documented the patient to be very anxious and tremulous. He reports he will need to have some sort of sedation to undergo an MRI was endorsed drinking a bottle of one at night to help him go to sleep.  Per minute discussion with the patient, patient began developing neuropathy of the hands and feet about 6 months after he completed his chemotherapy. He began his tingling with the left hand worse than the right as well as the left foot worse than the right. Patient reports that for the past 2-4 weeks the left leg has felt a little more unsteady when he tries to walk within after he goes for his usual run both legs appear to be more weak than usual. Because of this he has  decreased the speed at which he runs to 3 miles per hour. When he is running he does not express chest pain or shortness of breath. He reports a long-standing history of anxiety and panic attacks and for 2-3 years has been drinking at least one 750 mL bottle of wine per [place] in the evenings.  ED Course:  PO 98.3-119/87-73 and regular (although initial EKG demonstrated atrial tachycardia with peaked T waves and a ventricular response of 125 bpm without ischemic changes)-18-room air 99% CT head without contrast: Pending at time EDP called request for patient evaluation at 1310; resulted as no acute findings at 1324 Two-view chest x-ray: No active cardio pulmonary disease Lab data: Sodium 139, potassium 4.2, BUN 13, creatinine 0.96, glucose 109, troponin point-of-care 0.00 this appears, WBCs 3300 and differential obtained, platelets 125,000, hemoglobin 12.1 Formal neurology and cardiology consultations obtained while in the ER Ativan 1 mg by mouth 1 Ativan 1 mg IV 1  Review of Systems:  In addition to the HPI above,  No Fever-chills, myalgias or other constitutional symptoms No Headache, changes with Vision or hearing No problems swallowing food or Liquids, indigestion/reflux No Chest pain, Cough or Shortness of Breath, orthopnea or DOE No Abdominal pain, N/V; no melena or hematochezia, no dark tarry stools, Bowel movements are regular, No dysuria, hematuria or flank pain No new skin rashes, lesions, masses or bruises, No new joints pains-aches No recent weight gain or loss No polyuria, polydypsia or polyphagia,   Past Medical History  Diagnosis Date  . ED (erectile dysfunction)   .  Pulmonary emboli ([name])   . Depression   . Cancer ([place]) 07/2011    STAGE II NON- HODGKIN LYMPHOMA  . Lymphoma, non Hodgkin's 09/25/2011  . Mobitz I 10/09/2015  . PAC (premature atrial contraction) 10/09/2015  . Atrial tachycardia ([place]) 01/06/2016  . Murmur 01/06/2016  . Mitral regurgitation 01/20/2016     Past Surgical History  Procedure Laterality Date  . No past surgeries    . Trigger finger release Bilateral   . Tee without cardioversion N/A 02/11/2016    Procedure: TRANSESOPHAGEAL ECHOCARDIOGRAM (TEE);  Surgeon: [name], MD;  Location: [facility] ENDOSCOPY;  Service: Cardiovascular;  Laterality: N/A;     reports that he quit smoking about 2 years ago. His smoking use included Cigarettes. He has a 32 pack-year smoking history. He uses smokeless tobacco (Vapes with 3% nicotine-containing products). He reports that he drinks alcohol-one 750 mL bottle of wine nightly. He reports that he does not use illicit drugs.  Mobility: Without assistive devices Work history: Works at [employer]   No Known Allergies  Family History  Problem Relation Age of Onset  . Cancer Mother     BREAST(BONE)  . Cancer Father     PANCREATIC      Prior to Admission medications   Medication Sig Start Date End Date Taking? Authorizing Provider  ALPRAZolam Duanne Moron) 0.25 MG tablet TAKE ONE TABLET BY MOUTH TWICE DAILY AS NEEDED FOR ANXIETY 03/10/16  Yes [name], MD  metoprolol tartrate (LOPRESSOR) 25 MG tablet Take 0.5 tablets (12.5 mg total) by mouth 2 (two) times daily. 01/05/16  Yes [name], MD    Physical Exam: Filed Vitals:   04/02/16 1115 04/02/16 1130 04/02/16 1215 04/02/16 1245  BP: 121/81 133/83 109/83 98/77  Pulse: 55 94 68 89  Temp:      TempSrc:      Resp: 19 18 18 16   Height:      Weight:      SpO2: 96% 98% 97% 97%      Constitutional: NAD, calm, comfortable Eyes: PERRL, lids and conjunctivae normal ENMT: Mucous membranes are moist. Posterior pharynx clear of any exudate or lesions.Normal dentition.  Neck: normal, supple, no masses, no thyromegaly Respiratory: clear to auscultation bilaterally, no wheezing, no crackles. Normal respiratory effort. No accessory muscle use.  Cardiovascular: Regular rate and rhythm, no murmurs / rubs / gallops. No extremity  edema. 2+ pedal pulses. No carotid bruits.  Abdomen: no tenderness, no masses palpated. No hepatosplenomegaly. Bowel sounds positive.  Musculoskeletal: no clubbing / cyanosis. No joint deformity upper and lower extremities. Good ROM, no contractures. Normal muscle tone.  Skin: no rashes, lesions, ulcers. No induration Neurologic: CN 2-12 grossly intact. Sensation intact, DTR normal. Strength 5/5 x all 4 extremities.  Psychiatric: Normal judgment and insight. Alert and oriented x 3. Normal mood.    Labs on Admission: I have personally reviewed following labs and imaging studies  CBC:  Recent Labs Lab 04/02/16 0958  WBC 3.3*  HGB 12.1*  HCT 37.8*  MCV 79.6  PLT 0000000*   Basic Metabolic Panel:  Recent Labs Lab 04/02/16 0958  NA 139  K 4.2  CL 102  CO2 27  GLUCOSE 109*  BUN 13  CREATININE 0.96  CALCIUM 9.4   GFR: Estimated Creatinine Clearance: 93.5 mL/min (by C-G formula based on Cr of 0.96). Liver Function Tests: No results for input(s): AST, ALT, ALKPHOS, BILITOT, PROT, ALBUMIN in the last 168 hours. No results for input(s): LIPASE, AMYLASE in the last 168  hours. No results for input(s): AMMONIA in the last 168 hours. Coagulation Profile: No results for input(s): INR, PROTIME in the last 168 hours. Cardiac Enzymes: No results for input(s): CKTOTAL, CKMB, CKMBINDEX, TROPONINI in the last 168 hours. BNP (last 3 results) No results for input(s): PROBNP in the last 8760 hours. HbA1C: No results for input(s): HGBA1C in the last 72 hours. CBG: No results for input(s): GLUCAP in the last 168 hours. Lipid Profile: No results for input(s): CHOL, HDL, LDLCALC, TRIG, CHOLHDL, LDLDIRECT in the last 72 hours. Thyroid Function Tests: No results for input(s): TSH, T4TOTAL, FREET4, T3FREE, THYROIDAB in the last 72 hours. Anemia Panel: No results for input(s): VITAMINB12, FOLATE, FERRITIN, TIBC, IRON, RETICCTPCT in the last 72 hours. Urine analysis:    Component Value Date/Time    COLORURINE YELLOW 01/25/2013 0825   APPEARANCEUR CLEAR 01/25/2013 0825   LABSPEC 1.030 01/25/2013 0825   PHURINE 5.5 01/25/2013 0825   GLUCOSEU NEGATIVE 01/25/2013 0825   HGBUR NEGATIVE 01/25/2013 0825   BILIRUBINUR NEGATIVE 01/25/2013 0825   KETONESUR NEGATIVE 01/25/2013 0825   PROTEINUR NEGATIVE 01/25/2013 0825   UROBILINOGEN 1.0 01/25/2013 0825   NITRITE NEGATIVE 01/25/2013 0825   LEUKOCYTESUR NEGATIVE 01/25/2013 0825   Sepsis Labs: @LABRCNTIP (procalcitonin:4,lacticidven:4) )No results found for this or any previous visit (from the past 240 hour(s)).   Radiological Exams on Admission: Dg Chest 2 View  04/02/2016  CLINICAL DATA:  Chest pain, palpitations. EXAM: CHEST  2 VIEW COMPARISON:  Chest CT 01/08/2015.  Chest x-ray 01/30/2013. FINDINGS: Heart and mediastinal contours are within normal limits. No focal opacities or effusions. No acute bony abnormality. IMPRESSION: No active cardiopulmonary disease. Electronically Signed   By: Rolm Baptise M.D.   On: 04/02/2016 10:56    EKG: (Independently reviewed) atrial tachycardia with peaked P waves with various morphology, ventricular rate 125 bpm, eventual conversion to sinus rhythm versus slow multifocal atrial tachycardia, QTC 489 ms, no ST segment elevation or T-wave changes that would be concerning for underlying ischemia  Assessment/Plan Principal Problem:   Worsened left-sided weakness and numbness/?? TIA (transient ischemic attack) vs 2/2 to known Neuropathy due to chemotherapeutic drug -Has been experiencing symptoms intermittently for the past 30 days; many of these episodes have been associated with increased anxiety levels -Neurology following -CT head pending -Had 2-D echocardiogram as well as TEE April 2017 and at this juncture repeating does not appear to be indicated -PT/OT/SLP evaluation -Carotid duplex -Hemoglobin A1c and lipid panel -MRI/MRA likely to be recommended by neurology patient will require sedation before  can proceed -Patient has underlying mild leukocytopenia and at previous visit with oncologist it was felt this was related to his diclofenac usage; unclear if candidate for low-dose aspirin  Active Problems:   Mobitz I/Atrial tachycardia  -Long-standing medical problem and is undergoing outpatient evaluation -Recent echo demonstrated massively dilated left atrium which is contributing to his underlying arrhythmia -Tachycardia seems to be worsened by patient's underlying anxiety state -Currently rate controlled -Continue preadmission Lopressor -Appreciate cardiology assistance    Anemia/Leucopenia -Valuated by oncologists 02/02/2016 who suspected these lab abnormalities secondary to diclofenac and recommended to the patient he decreased the amount of diclofenac he takes on a daily basis and utilize Tylenol instead; patient reports that on date of visit with oncologist in March he stopped taking diclofenac -Current platelets are slightly lower than previous reading of 141,000 and now at 125,000 so we'll need to follow closely during the hospitalization    Mitral regurgitation -After evaluation by thoracic surgeon documented as  stage C and possible early stage D to be her primary mitral regurgitation -Options including continued medical therapy with close follow-up versus proceeding with elective valve repair in the near future were discussed with the patient including recommendation that he would be a good candidate for minimally invasive approach to surgery -According to the office documentation patient was considering proceeding with elective surgery at some point this summer and he planned to notify the thoracic surgeon of his decision regarding timing so he could proceed with diagnostic cardiac catheterization and CT angiography (tentatively scheduled for 04/15/16)    History of non-Hodgkin's lymphoma -Completed systemic chemotherapy as above in 2013 -Last evaluated March 2017 with plans to  follow up again in 6 months    Personal history of pulmonary embolism -Not been on Xarelto for at least one year since had solitary episode of PE    Anxiety disorder with panic attacks -Patient has been having significant issues with anxiety and panic attacks are several years and they seemed to have worsened -He reports typically taking Xanax at least once a Filosa but he tries to limit the dosages as much as possible -He is also drinking one 750 mL bottle of wine every night to assist in his anxiety -Suspect recurrent palpitations and likely episodes of MAT at home are contributing physiologically to his anxiety and panic episodes i.e. increased catecholamine release which is contributed to patient's anxiety symptoms -Continue prn benzodiazepines -Initiate BuSpar at 7.5 mg twice a [place] with recommendations to increase by 5 mg per [place] divided every 2-3 days with a max end dose of 30 mg twice a [place]    Daily alcohol use -CIWA protocol    Former tobacco abuse -Patient now states that the solution he utilizes contains a 3% nicotine formula so doubt sold regarding need to taper and discontinue bathing    Arthritis -Continue Tylenol      DVT prophylaxis: Lovenox-we'll need to discontinue if platelets drop below 100,000  Code Status: Full code  Family Communication: No family at bedside Disposition Plan: Anticipate discharge back to preadmission home environment Consults called: Cardiology/[place]; neurology/K[place]patrick  Admission status: Observation/telemetry     Ares Cardozo L. ANP-BC [facility] Pager [phone]   If 7PM-7AM, please contact night-coverage [email or web address] Password [email or web address]  04/02/2016, 1:06 PM

## 2016-04-03 ENCOUNTER — Observation Stay (HOSPITAL_COMMUNITY): Payer: No Typology Code available for payment source

## 2016-04-03 DIAGNOSIS — F411 Generalized anxiety disorder: Secondary | ICD-10-CM | POA: Diagnosis not present

## 2016-04-03 DIAGNOSIS — R200 Anesthesia of skin: Secondary | ICD-10-CM | POA: Insufficient documentation

## 2016-04-03 DIAGNOSIS — I471 Supraventricular tachycardia: Secondary | ICD-10-CM | POA: Diagnosis not present

## 2016-04-03 LAB — LIPID PANEL
Cholesterol: 239 mg/dL — ABNORMAL HIGH (ref 0–200)
HDL: 126 mg/dL (ref >40)
LDL Cholesterol: 100 mg/dL — ABNORMAL HIGH (ref 0–99)
Total CHOL/HDL Ratio: 1.9 RATIO
Triglycerides: 63 mg/dL (ref <150)
VLDL: 13 mg/dL (ref 0–40)

## 2016-04-03 MED ORDER — THIAMINE HCL 100 MG PO TABS
100.0000 mg | ORAL_TABLET | Freq: Every day | ORAL | Status: DC
Start: 2016-04-03 — End: 2016-04-03

## 2016-04-03 MED ORDER — BUSPIRONE HCL 7.5 MG PO TABS: 7.5000 mg | ORAL_TABLET | Freq: Two times a day (BID) | ORAL | Status: DC

## 2016-04-03 MED ORDER — FOLIC ACID 1 MG PO TABS: 1.0000 mg | ORAL_TABLET | Freq: Every day | ORAL | Status: DC

## 2016-04-03 MED ORDER — ASPIRIN EC 325 MG PO TBEC: 325.0000 mg | DELAYED_RELEASE_TABLET | Freq: Every day | ORAL | Status: DC

## 2016-04-03 MED ORDER — IOPAMIDOL (ISOVUE-370) INJECTION 76%
INTRAVENOUS | Status: AC
  Administered 2016-04-03: 50 mL
  Filled 2016-04-03: qty 50

## 2016-04-03 MED ORDER — ATORVASTATIN CALCIUM 20 MG PO TABS: 20.0000 mg | ORAL_TABLET | Freq: Every day | ORAL | Status: DC

## 2016-04-03 MED ORDER — THIAMINE HCL 100 MG PO TABS: 100.0000 mg | ORAL_TABLET | Freq: Every day | ORAL | Status: DC

## 2016-04-03 NOTE — Progress Notes (Signed)
Occupational Therapy Evaluation Patient Details Name: Leonard Bentley MRN: [id number] DOB: 02-07-1961 Today's Date: 04/03/2016    History of Present Illness 55 y.o. male admitted for chest pain and numbness to L arm and face. PMH significant for non-Hodgkin lymphoma stage II, PAC, atrial tachycardia, murmur, mitral reguritation, hx of PE, and depression.    Clinical Impression   Patient has been evaluated by Occupational Therapy with no acute OT needs identified. Pt completed all ADLs and transfers at mod I level and completed standardized balance assessment with no deficits observed. Pt educated on fall prevention and signs/symptoms of a stroke. All education has been completed and pt has no further questions. Pt with no further acute OT needs. OT signing off.    Follow Up Recommendations  No OT follow up    Equipment Recommendations  None recommended by OT    Recommendations for Other Services       Precautions / Restrictions Precautions Precautions: None Restrictions Weight Bearing Restrictions: No      Mobility Bed Mobility               General bed mobility comments: Pt up in chair on OT arrival  Transfers Overall transfer level: Modified independent Equipment used: None             General transfer comment: Mild dizziness when ambulating in hallway - resolved after 1 min break    Balance Overall balance assessment: Needs assistance Sitting-balance support: Feet supported;No upper extremity supported Sitting balance-Leahy Scale: Normal     Standing balance support: No upper extremity supported;During functional activity Standing balance-Leahy Scale: Normal                   Standardized Balance Assessment Standardized Balance Assessment : Dynamic Gait Index   Dynamic Gait Index Level Surface: Normal Change in Gait Speed: Normal Gait with Horizontal Head Turns: Normal Gait with Vertical Head Turns: Normal Gait and Pivot Turn: Normal Step  Over Obstacle: Normal Step Around Obstacles: Normal Steps: Normal Total Score: 24      ADL Overall ADL's : Modified independent                                             Vision Vision Assessment?: Yes Eye Alignment: Within Functional Limits Ocular Range of Motion: Within Functional Limits Alignment/Gaze Preference: Within Defined Limits Tracking/Visual Pursuits: Able to track stimulus in all quads without difficulty Saccades: Additional eye shifts occurred during testing;Additional head turns occurred during testing Convergence: Within functional limits Visual Fields: No apparent deficits   Agricultural engineer Tested?: Yes Perception Deficits: Inattention/neglect Inattention/Neglect: Appears intact   Praxis      Pertinent Vitals/Pain Pain Assessment: No/denies pain     Hand Dominance Right   Extremity/Trunk Assessment Upper Extremity Assessment Upper Extremity Assessment: Overall WFL for tasks assessed;RUE deficits/detail;LUE deficits/detail (Bilateral tremors L>R noted - pt reports this is baseline) RUE Deficits / Details: reports numbness/tingling in 3rd and 4th digit, light touch intact LUE Deficits / Details: reports numbness/tingling in 3rd and 4th digit, light touch intact   Lower Extremity Assessment Lower Extremity Assessment: Overall WFL for tasks assessed   Cervical / Trunk Assessment Cervical / Trunk Assessment: Normal   Communication Communication Communication: No difficulties   Cognition Arousal/Alertness: Awake/alert Behavior During Therapy: Anxious Overall Cognitive Status: Within Functional Limits for tasks assessed  General Comments       Exercises       Shoulder Instructions      Home Living Family/patient expects to be discharged to:: Private residence Living Arrangements: Alone Available Help at Discharge: Family;Available PRN/intermittently Type of Home: House Home  Access: Stairs to enter [employer] of Steps: 3 Entrance Stairs-Rails: Left Home Layout: One level     Bathroom Shower/Tub: Tub/shower unit Shower/tub characteristics: Architectural technologist: Standard     Home Equipment: Shower seat;Hand held shower head          Prior Functioning/Environment Level of Independence: Independent        Comments: Works Retail buyer    OT Diagnosis:     OT Problem List: Impaired sensation;Impaired vision/perception   OT Treatment/Interventions:      OT Goals(Current goals can be found in the care plan section) Acute Rehab OT Goals Patient Stated Goal: to have energy again to be able to run OT Goal Formulation: With patient Time For Goal Achievement: 04/17/16 Potential to Achieve Goals: Good  OT Frequency:     Barriers to D/C:            Co-evaluation              End of Session Equipment Utilized During Treatment: Gait belt Nurse Communication: Mobility status  Activity Tolerance: Patient tolerated treatment well Patient left: in chair;with call bell/phone within reach;with chair alarm set   Time: [id number] OT Time Calculation (min): 33 min Charges:  OT General Charges $OT Visit: 1 Procedure OT Evaluation $OT Eval Low Complexity: 1 Procedure OT Treatments $Self Care/Home Management : 8-22 mins G-Codes: OT G-codes **NOT FOR INPATIENT CLASS** Functional Assessment Tool Used: clinical judgement Functional Limitation: Self care Self Care Current Status [id number]): 0 percent impaired, limited or restricted Self Care Goal Status [id number]): 0 percent impaired, limited or restricted Self Care Discharge Status [id number]): 0 percent impaired, limited or restricted  Redmond Baseman, OTR/L Pager: [phone] 04/03/2016, 3:08 PM

## 2016-04-03 NOTE — Discharge Instructions (Signed)
Follow with Primary MD Wyatt Haste, MD in 7 days   Get CBC, CMP,  checked  by Primary MD next visit.    Activity: As tolerated with Full fall precautions use walker/cane & assistance as needed   Disposition Home    Diet: Heart Healthy  , with feeding assistance and aspiration precautions.  For Heart failure patients - Check your Weight same time everyday, if you gain over 2 pounds, or you develop in leg swelling, experience more shortness of breath or chest pain, call your Primary MD immediately. Follow Cardiac Low Salt Diet and 1.5 lit/Primo fluid restriction.   On your next visit with your primary care physician please Get Medicines reviewed and adjusted.   Please request your Prim.MD to go over all Hospital Tests and Procedure/Radiological results at the follow up, please get all Hospital records sent to your Prim MD by signing hospital release before you go home.   If you experience worsening of your admission symptoms, develop shortness of breath, life threatening emergency, suicidal or homicidal thoughts you must seek medical attention immediately by calling 911 or calling your MD immediately  if symptoms less severe.  You Must read complete instructions/literature along with all the possible adverse reactions/side effects for all the Medicines you take and that have been prescribed to you. Take any new Medicines after you have completely understood and accpet all the possible adverse reactions/side effects.   Do not drive, operating heavy machinery, perform activities at heights, swimming or participation in water activities or provide baby sitting services if your were admitted for syncope or siezures until you have seen by Primary MD or a Neurologist and advised to do so again.  Do not drive when taking Pain medications.    Do not take more than prescribed Pain, Sleep and Anxiety Medications  Special Instructions: If you have smoked or chewed Tobacco  in the last 2  yrs please stop smoking, stop any regular Alcohol  and or any Recreational drug use.  Wear Seat belts while driving.   Please note  You were cared for by a hospitalist during your hospital stay. If you have any questions about your discharge medications or the care you received while you were in the hospital after you are discharged, you can call the unit and asked to speak with the hospitalist on call if the hospitalist that took care of you is not available. Once you are discharged, your primary care physician will handle any further medical issues. Please note that NO REFILLS for any discharge medications will be authorized once you are discharged, as it is imperative that you return to your primary care physician (or establish a relationship with a primary care physician if you do not have one) for your aftercare needs so that they can reassess your need for medications and monitor your lab values.

## 2016-04-03 NOTE — Progress Notes (Signed)
Physical Therapy Evaluation & Discharge Patient Details Name: Eddi Tatar Hollars MRN: [id number] DOB: May 16, 1961 Today's Date: 04/03/2016   History of Present Illness  55 y.o. male admitted for chest pain and numbness to L arm and face. PMH significant for non-Hodgkin lymphoma stage II, PAC, atrial tachycardia, murmur, mitral reguritation, hx of PE, and depression.   Clinical Impression  Patient seen with MD in room, preparing for hospital discharge.  Left side weakness symptoms resolved at this time, MD discussed with patient pending cardiac issues.  Patient has questions about high level balance martial arts and physical activity.  Noted Bilateral tremor in hands and decreased sensation L plantar foot, mild impairment in balance from high level function, but Mod I for overall mobility.  PT evaluation only at this time, did review general balance training principles.  PT not further indicated at this time, but may need rehab services following cardiac care planning.    Follow Up Recommendations      Equipment Recommendations  None recommended by PT    Recommendations for Other Services       Precautions / Restrictions Precautions Precautions: None Restrictions Weight Bearing Restrictions: No      Mobility  Bed Mobility               General bed mobility comments: Pt up in chair on arrival with MD present.  Transfers Overall transfer level: Modified independent Equipment used: None             General transfer comment: Mild dizziness when ambulating in hallway - resolved after 1 min break  Ambulation/Gait Ambulation/Gait assistance: Modified independent (Device/Increase time) Ambulation Distance (Feet): 50 Feet (in room and in hallway without limitation) Assistive device: None Gait Pattern/deviations: Trunk flexed;Decreased stride length (nervous/agitated looking)   Gait velocity interpretation: Below normal speed for age/gender    Stairs            Wheelchair  Mobility    Modified Rankin (Stroke Patients Only) Modified Rankin (Stroke Patients Only) Pre-Morbid Rankin Score: No significant disability Modified Rankin: No significant disability     Balance Overall balance assessment: Needs assistance Sitting-balance support: Feet supported;No upper extremity supported Sitting balance-Leahy Scale: Normal     Standing balance support: No upper extremity supported;During functional activity Standing balance-Leahy Scale: Normal   Single Leg Stance - Right Leg: 10 Single Leg Stance - Left Leg: 3           High Level Balance Comments: Nervous / anxious posture and tremor in standing Standardized Balance Assessment Standardized Balance Assessment : Dynamic Gait Index   Dynamic Gait Index Level Surface: Normal Change in Gait Speed: Normal Gait with Horizontal Head Turns: Normal Gait with Vertical Head Turns: Normal Gait and Pivot Turn: Normal Step Over Obstacle: Normal Step Around Obstacles: Normal Steps: Normal Total Score: 24       Pertinent Vitals/Pain Pain Assessment: No/denies pain    Home Living Family/patient expects to be discharged to:: Private residence Living Arrangements: Alone Available Help at Discharge: Family;Available PRN/intermittently Type of Home: House Home Access: Stairs to enter Entrance Stairs-Rails: Left Entrance Stairs-Number of Steps: 3 Home Layout: One level Home Equipment: Shower seat;Hand held shower head      Prior Function Level of Independence: Independent         Comments: Works Administrator Dominance   Dominant Hand: Right    Extremity/Trunk Assessment   Upper Extremity Assessment: Overall WFL for tasks assessed;Defer to OT evaluation (Tremor  noted Bilaterally, reports present for years) RUE Deficits / Details: reports numbness/tingling in 3rd and 4th digit, light touch intact     LUE Deficits / Details: reports numbness/tingling in 3rd and 4th digit,  light touch intact   Lower Extremity Assessment: Overall WFL for tasks assessed;LLE deficits/detail      Cervical / Trunk Assessment: Normal  Communication   Communication: No difficulties  Cognition Arousal/Alertness: Awake/alert Behavior During Therapy: Anxious Overall Cognitive Status: Within Functional Limits for tasks assessed                      General Comments      Exercises        Assessment/Plan    PT Assessment Patent does not need any further PT services  PT Diagnosis Difficulty walking   PT Problem List    PT Treatment Interventions     PT Goals (Current goals can be found in the Care Plan section) Acute Rehab PT Goals Patient Stated Goal: to have energy again to be able to run    Frequency     Barriers to discharge        Co-evaluation               End of Session     Patient left: in chair Nurse Communication: Mobility status    Functional Assessment Tool Used: Clinical judgement Functional Limitation: Mobility: Walking and moving around Mobility: Walking and Moving Around Current Status [id number]): At least 1 percent but less than 20 percent impaired, limited or restricted Mobility: Walking and Moving Around Goal Status [phone]): 0 percent impaired, limited or restricted Mobility: Walking and Moving Around Discharge Status [phone]): At least 1 percent but less than 20 percent impaired, limited or restricted    Time: 1415-1450 PT Time Calculation (min) (ACUTE ONLY): 35 min   Charges:   PT Evaluation $PT Eval Low Complexity: 1 Procedure PT Treatments $Therapeutic Activity: 8-22 mins   PT G Codes:   PT G-Codes **NOT FOR INPATIENT CLASS** Functional Assessment Tool Used: Clinical judgement Functional Limitation: Mobility: Walking and moving around Mobility: Walking and Moving Around Current Status [id number]): At least 1 percent but less than 20 percent impaired, limited or restricted Mobility: Walking and Moving Around Goal Status  [id number]): 0 percent impaired, limited or restricted Mobility: Walking and Moving Around Discharge Status [phone]): At least 1 percent but less than 20 percent impaired, limited or restricted    Zenia Resides, Lazlo Tunney L 04/03/2016, 3:29 PM

## 2016-04-03 NOTE — Progress Notes (Signed)
Pt for discharge home today. Discharge orders received. IV and telemetry dcd. Discharge instructions and 5 prescriptions given with verbalized understanding. Staff brought patient to lobby at 1519. Pt refused to contact family for ride home and refused further assistance with transportation.

## 2016-04-03 NOTE — Progress Notes (Signed)
STROKE TEAM PROGRESS NOTE   HISTORY OF PRESENT ILLNESS (per record) Leonard Bentley is an 55 y.o. male with new onset Afib while in ED, history of PE, PAC, Atrial tachycardia, mitral regurgitation and anxiety. He states for the last moth he has had intermittent tingling of his face (unilateral and bilateral) left arm (hand) and left foot. At times the tingling will move from face to arm to hand. He notes the sensation often occurs when he is anxious and if this occurs in the morning he will go running which will reslove the sensation. He cannot recall the episodes ever occuring when he is calm. He feels there is associated generalized weakness but at times he feels his left sided is weaker than right --these episodes last for about 30 minutes and have occurred for a few months. . Currently he is asymptomatic. He is very anxious and tremulous.  Upon mentioning MRI he became very anxious and states he will not be able to do this unless he is placed asleep.   States he drinks a bottle of wine a night and no illicit drugs.   Date last known well: Unable to determine Time last known well: Unable to determine tPA Given: No: no symptoms   SUBJECTIVE (INTERVAL HISTORY) His family is not at the bedside.  Overall he feels his condition is completely resolved. He states he a;lways has numbness and that remains but the additional numbness and heaviness in the left foot is gone   OBJECTIVE Temp:  [98 F (36.7 C)-98.4 F (36.9 C)] 98 F (36.7 C) (05/27 0600) Pulse Rate:  [38-110] 61 (05/27 0600) Cardiac Rhythm:  [-] Atrial fibrillation (05/27 0600) Resp:  [16-27] 18 (05/27 0600) BP: (95-133)/(62-105) 107/62 mmHg (05/27 0600) SpO2:  [96 %-100 %] 99 % (05/27 0600) Weight:  [86.183 kg (190 lb)] 86.183 kg (190 lb) (05/26 0927)  CBC:  Recent Labs Lab 04/02/16 0958  WBC 3.3*  HGB 12.1*  HCT 37.8*  MCV 79.6  PLT 125*    Basic Metabolic Panel:  Recent Labs Lab 04/02/16 0958  NA 139  K 4.2  CL  102  CO2 27  GLUCOSE 109*  BUN 13  CREATININE 0.96  CALCIUM 9.4    Lipid Panel:    Component Value Date/Time   CHOL 239* 04/03/2016 0323   CHOL 238* 08/18/2015 0825   TRIG 63 04/03/2016 0323   HDL 126 04/03/2016 0323   HDL 124 08/18/2015 0825   CHOLHDL 1.9 04/03/2016 0323   CHOLHDL 1.9 08/18/2015 0825   VLDL 13 04/03/2016 0323   LDLCALC 100* 04/03/2016 0323   LDLCALC 94 08/18/2015 0825   HgbA1c:  Lab Results  Component Value Date   HGBA1C 5.9* 08/18/2015   Urine Drug Screen:    Component Value Date/Time   LABOPIA NONE DETECTED 04/02/2016 1257   COCAINSCRNUR NONE DETECTED 04/02/2016 1257   LABBENZ NONE DETECTED 04/02/2016 1257   AMPHETMU NONE DETECTED 04/02/2016 1257   THCU NONE DETECTED 04/02/2016 1257   LABBARB NONE DETECTED 04/02/2016 1257      IMAGING  Dg Chest 2 View 04/02/2016   No active cardiopulmonary disease.   Ct Head Wo Contrast 04/02/2016   No acute intracranial findings.   Mr Jodene Nam Head Wo Contrast 04/02/2016    MRI HEAD  No acute infarct or intracranial hemorrhage. No intracranial mass lesion noted on this unenhanced exam. Global atrophy most prominent parietal lobe region. No hydrocephalus. Mild paranasal sinus mucosal thickening most notable inferior aspect right maxillary sinus  measuring up to 6 mm.   MRA HEAD Anterior circulation without medium or large size vessel significant stenosis or occlusion. Mild ectasia portions of the carotid arteries. Middle cerebral artery mild branch vessel irregularity. No significant stenosis of either distal vertebral artery or basilar artery. Minimal irregularity portions of the posterior inferior cerebellar arteries. Anterior inferior cerebellar arteries not visualized.    PHYSICAL EXAM  HEENT- Normocephalic, no lesions, without obvious abnormality. Normal external eye and conjunctiva. Normal TM's bilaterally. Normal auditory canals and external ears. Normal external nose, mucus membranes and septum.  Normal pharynx. Cardiovascular- irregularly irregular rhythm, pulses palpable throughout  Lungs- chest clear, no wheezing, rales, normal symmetric air entry Abdomen- normal findings: bowel sounds normal Extremities- no edema Musculoskeletal-no joint tenderness, deformity or swelling Skin-warm and dry, no hyperpigmentation, vitiligo, or suspicious lesions  Neurological Examination Mental Status: Alert, oriented, thought content appropriate. Speech fluent without evidence of aphasia. Able to follow 3 step commands without difficulty.  Speech slightly pressured  Cranial Nerves: II: Visual fields grossly normal, pupils equal, round, reactive to light and accommodation III,IV, VI: ptosis not present, extra-ocular motions intact bilaterally V,VII: smile symmetric, facial light touch sensation normal bilaterally VIII: hearing normal bilaterally IX,X: uvula rises symmetrically XI: bilateral shoulder shrug XII: midline tongue extension Motor:   Right :Upper extremity 5/5Left: Upper extremity 5/5 Lower extremity 5/5Lower extremity 5/5 Very anxious and tremulous Sensory: Pinprick and light touch intact throughout, bilaterally cerebellar: normal finger-to-nose with tremor but no past pointing, and normal heel-to-shin test with tremor Gait: normal gait and station   ASSESSMENT/PLAN Mr. Leonard Bentley is a 55 y.o. male with history of pulmonary emboli, depression, anxiety, panic attacks, alcohol use, moderate to severe mitral regurgitation, stage II non-Hodgkin's lymphoma, atrial tachycardia, and possible new onset of atrial fibrillation while in the emergency department presenting with facial numbness, left arm, and left foot numbness. He did not receive IV t-PA due to resolution of deficits.  Possible TIA:  Non-dominant - unknown etiology.  Resultant resolution of deficits  MRI  - no acute infarct  MRA  unremarkable  Carotid Doppler - not performed  TEE - 02/11/2016 - EF 60-65%. No cardiac source of emboli identified. Moderate to severe mitral regurgitation.  LDL - 100  HgbA1c pending  VTE prophylaxis - Lovenox  Diet Heart Room service appropriate?: Yes; Fluid consistency:: Thin  No antithrombotic prior to admission, now on No antithrombotic  Ongoing aggressive stroke risk factor management  Therapy recommendations: Pending  Disposition:  Pending  Hypertension  Stable - blood pressure tends to run low  Permissive hypertension (OK if < 220/120) but gradually normalize in 5-7 days  Hyperlipidemia  Home meds: No lipid lowering medications prior to admission  LDL 100, goal < 70  Consider low-dose statin  Continue statin at discharge   New-Onset Atrial Fibrillation  Not anticoagulated  Other Stroke Risk Factors  Cigarette smoker - quit smoking 2 years ago.  ETOH use  Atrial fibrillation   Other Active Problems  Pancytopenia  Cardiac catheterization and possible mitral valve surgery tentatively scheduled for June.  [facility] #   Mikey Bussing PA-C [facility] Pager [phone] 04/03/2016, 11:10 AM  ATTENDING NOTE: Patient was seen and examined by me personally. Documentation reflects findings. The laboratory and radiographic studies reviewed by me. ROS completed by me personally and pertinent positives include chronic bilateral leg numbness left greater than right  Condition: stable  Assessment and plan:  Event less likely to ne cerebrovascular in nature.  After  lengthy review of history, symptoms appear to be in the spectrum of worsning and remitting neuropathic discomfort.  I did discuss the rick of stroke with patient given his heart condition.  Per cardiology consult patient does not need anticoagulation for current hear condition since this event was not clearly cerebrovascular.   ASA 325mg  daily  (given arhythmia)  until cardiology determines otherwise  Recommend statin therapy  Wrote Work excuse for admission dates   SIGNED BY: Dr. Elissa Hefty      To contact Stroke Continuity provider, please refer to [email or web address] After hours, contact General Neurology

## 2016-04-03 NOTE — Discharge Summary (Addendum)
Leonard Bentley, is a 55 y.o. male  DOB 1961/06/29  MRN [id number].  Admission date:  04/02/2016  Admitting Physician  Kinnie Feil, MD  Discharge Date:  04/03/2016   Primary MD  Wyatt Haste, MD  Recommendations for primary care physician for things to follow:  - Check CBC, BMP during next visit - Patient to follow with cardiology as an outpatient   Admission Diagnosis  multifocal atrial tachycardia Left side numbness   Discharge Diagnosis   Multifocal atrial tachycardia Left Side numbness    Active Problems:   History of non-Hodgkin's lymphoma   Anemia   Personal history of pulmonary embolism   Leucopenia   Neuropathy due to chemotherapeutic drug (HCC)   Arthritis   Mobitz I   Atrial tachycardia (HCC)   Mitral regurgitation   Anxiety disorder   Panic attacks   Left sided numbness      Past Medical History  Diagnosis Date  . ED (erectile dysfunction)   . Pulmonary emboli ([place])   . Depression   . Cancer ([place]) 07/2011    STAGE II NON- HODGKIN LYMPHOMA  . Lymphoma, non Hodgkin's 09/25/2011  . Mobitz I 10/09/2015  . PAC (premature atrial contraction) 10/09/2015  . Atrial tachycardia ([place]) 01/06/2016  . Murmur 01/06/2016  . Mitral regurgitation 01/20/2016    Past Surgical History  Procedure Laterality Date  . No past surgeries    . Trigger finger release Bilateral   . Tee without cardioversion N/A 02/11/2016    Procedure: TRANSESOPHAGEAL ECHOCARDIOGRAM (TEE);  Surgeon: [name], MD;  Location: [facility];  Service: Cardiovascular;  Laterality: N/A;       History of present illness and  Hospital Course:     Kindly see H&P for history of present illness and admission details, please review complete Labs, Consult reports and Test reports for all details in brief  HPI  from the history and physical done on the [place] of admission 04/02/2016 Leonard Bentley is a 55 y.o.  male with medical history significant for non-Hodgkin's lymphoma status post systemic chemotherapy with CHOP/Rituxan with last dose January 2013, known history of severe mitral regurgitation evaluated as an outpatient by CVTS, no nature of tachycardia, recent issues of fatigue and increasing palpitations, history of PE, depression and anxiety who presented to the ER with reports of chest pain but primarily complaining of left-sided numbness. 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 He reported to the neurology PA that for the past month he has been experiencing intermittent tingling of his face with both unilateral and bilateral well as similar sensations in the left hand and left foot and the symptoms appeared to be associated with onset of increased anxiety. While in the ER he has been evaluated not only by the EDP but by neurology and cardiology and all providers have documented the patient to be very anxious and tremulous. He reports he will need to have some sort of sedation  to undergo an MRI was endorsed drinking a bottle of one at night to help him go to sleep.  Per minute discussion with the patient, patient began developing neuropathy of the hands and feet about 6 months after he completed his chemotherapy. He began his tingling with the left hand worse than the right as well as the left foot worse than the right. Patient reports that for the past 2-4 weeks the left leg has felt a little more unsteady when he tries to walk within after he goes for his usual run both legs appear to be more weak than usual. Because of this he has decreased the speed at which he runs to 3 miles per hour. When he is running he does not express chest pain or shortness of breath. He reports a long-standing history of anxiety and panic attacks and for 2-3 years has been drinking at least one 750 mL bottle of wine per [place] in  the evenings.  ED Course:  PO 98.3-119/87-73 and regular (although initial EKG demonstrated atrial tachycardia with peaked T waves and a ventricular response of 125 bpm without ischemic changes)-18-room air 99% CT head without contrast: Pending at time EDP called request for patient evaluation at 1310; resulted as no acute findings at 1324 Two-view chest x-ray: No active cardio pulmonary disease Lab data: Sodium 139, potassium 4.2, BUN 13, creatinine 0.96, glucose 109, troponin point-of-care 0.00 this appears, WBCs 3300 and differential obtained, platelets 125,000, hemoglobin 12.1 Formal neurology and cardiology consultations obtained while in the ER Ativan 1 mg by mouth 1 Ativan 1 mg IV 1   Hospital Course   Left side tingling and numbness - Neurology consult appreciated, This is most likely related to worsening  peripheral neuropathy post chemotherapy, , event is less likely cerebrovascular in nature ,no evidence of acute CVA , MRI brain with no acute infarct or intracranial hemorrhage, MRA head with no significant stenosis, Negative CTA of the neck. No significant carotid or vertebral artery disease. - Start on full dose aspirin giving his arrhythmia - Start on low dose statin  Multifocal atrial tachycardia - Cardiology input appreciated, this is most likely related to predominant atrial tachycardia, with one documented episode of atrial fibrillation, per cards [place] Vasc score is  0, so no recommendation for anticoagulation, especially his left side numbness thought to be secondary to peripheral neuropathy and not related to a vascular event. - On beta blockers, blood pressure soft, so will continue on current home dose.  Mitral valve regurgitation - seen by CVTS and has elected for surgical intervention with mitral valve repair, He is scheduled for CT chest and Abd on 6/2 and cardiac cath on 6/8 as prep for MV repair. He has follow up appointment with Dr. Oval Linsey on 5/31  History of  non-Hodgkin's lymphoma -Completed systemic chemotherapy as above in 2013 -Last evaluated March 2017 with plans to follow up again in 6 months   Personal history of pulmonary embolism -Not been on Xarelto for at least one year since had solitary episode of PE  Anxiety disorder with panic attacks - Started on BuSpar  Discharge Condition: stable   Follow UP  Follow-up Information    Follow up with Wyatt Haste, MD.   Specialty:  Family Medicine   Contact information:   [place] [address] [phone]       Follow up with [name], MD. Schedule an appointment as soon as possible for a visit in 1 week.   Specialty:  Cardiology   Contact information:   [address] [place] [address] [phone]         Discharge Instructions  and  Discharge Medications         Discharge Instructions    Discharge instructions    Complete by:  As directed   Follow with Primary MD Wyatt Haste, MD in 7 days   Get CBC, CMP,  checked  by Primary MD next visit.    Activity: As tolerated with Full fall precautions use walker/cane & assistance as needed   Disposition Home    Diet: Heart Healthy  , with feeding assistance and aspiration precautions.  For Heart failure patients - Check your Weight same time everyday, if you gain over 2 pounds, or you develop in leg swelling, experience more shortness of breath or chest pain, call your Primary MD immediately. Follow Cardiac Low Salt Diet and 1.5 lit/Milles fluid restriction.   On your next visit with your primary care physician please Get Medicines reviewed and adjusted.   Please request your Prim.MD to go over all Hospital Tests and Procedure/Radiological results at the follow up, please get all Hospital records sent to your Prim MD by signing hospital release before you go home.   If you experience worsening of your admission symptoms, develop shortness of breath, life  threatening emergency, suicidal or homicidal thoughts you must seek medical attention immediately by calling 911 or calling your MD immediately  if symptoms less severe.  You Must read complete instructions/literature along with all the possible adverse reactions/side effects for all the Medicines you take and that have been prescribed to you. Take any new Medicines after you have completely understood and accpet all the possible adverse reactions/side effects.   Do not drive, operating heavy machinery, perform activities at heights, swimming or participation in water activities or provide baby sitting services if your were admitted for syncope or siezures until you have seen by Primary MD or a Neurologist and advised to do so again.  Do not drive when taking Pain medications.    Do not take more than prescribed Pain, Sleep and Anxiety Medications  Special Instructions: If you have smoked or chewed Tobacco  in the last 2 yrs please stop smoking, stop any regular Alcohol  and or any Recreational drug use.  Wear Seat belts while driving.   Please note  You were cared for by a hospitalist during your hospital stay. If you have any questions about your discharge medications or the care you received while you were in the hospital after you are discharged, you can call the unit and asked to speak with the hospitalist on call if the hospitalist that took care of you is not available. Once you are discharged, your primary care physician will handle any further medical issues. Please note that NO REFILLS for any discharge medications will be authorized once you are discharged, as it is imperative that you return to your primary care physician (or establish a relationship with a primary care physician if you do not have one) for your aftercare needs so that they can reassess your need for medications and monitor your lab values.            Medication List    TAKE these medications        ALPRAZolam  0.25 MG tablet  Commonly known as:  XANAX  TAKE ONE TABLET BY MOUTH TWICE DAILY AS NEEDED FOR ANXIETY     aspirin EC 325  MG tablet  Take 1 tablet (325 mg total) by mouth daily.     atorvastatin 20 MG tablet  Commonly known as:  LIPITOR  Take 1 tablet (20 mg total) by mouth daily.     busPIRone 7.5 MG tablet  Commonly known as:  BUSPAR  Take 1 tablet (7.5 mg total) by mouth 2 (two) times daily.     folic acid 1 MG tablet  Commonly known as:  FOLVITE  Take 1 tablet (1 mg total) by mouth daily.     metoprolol tartrate 25 MG tablet  Commonly known as:  LOPRESSOR  Take 0.5 tablets (12.5 mg total) by mouth 2 (two) times daily.     thiamine 100 MG tablet  Take 1 tablet (100 mg total) by mouth daily.          Diet and Activity recommendation: See Discharge Instructions above   Consults obtained -  Neurology Cardiology  Major procedures and Radiology Reports - PLEASE review detailed and final reports for all details, in brief -     Dg Chest 2 View  04/02/2016  CLINICAL DATA:  Chest pain, palpitations. EXAM: CHEST  2 VIEW COMPARISON:  Chest CT 01/08/2015.  Chest x-ray 01/30/2013. FINDINGS: Heart and mediastinal contours are within normal limits. No focal opacities or effusions. No acute bony abnormality. IMPRESSION: No active cardiopulmonary disease. Electronically Signed   By: Rolm Baptise M.D.   On: 04/02/2016 10:56   Ct Head Wo Contrast  04/02/2016  CLINICAL DATA:  Left-sided arm and leg weakness with some facial numbness and tingling 2 weeks. Nausea, vomiting and diaphoresis today. EXAM: CT HEAD WITHOUT CONTRAST TECHNIQUE: Contiguous axial images were obtained from the base of the skull through the vertex without intravenous contrast. COMPARISON:  01/08/2015 and 01/16/2014 FINDINGS: Ventricles, cisterns and other CSF spaces are normal. There is no mass, mass effect, shift of midline structures or acute hemorrhage. No evidence of acute infarction. Remaining bones and soft  tissues are within normal. IMPRESSION: No acute intracranial findings. Electronically Signed   By: Marin Olp M.D.   On: 04/02/2016 13:19   Ct Angio Neck W/cm &/or Wo/cm  04/03/2016  CLINICAL DATA:  TIA.  Atrial fibrillation. EXAM: CT ANGIOGRAPHY NECK TECHNIQUE: Multidetector CT imaging of the neck was performed using the standard protocol during bolus administration of intravenous contrast. Multiplanar CT image reconstructions and MIPs were obtained to evaluate the vascular anatomy. Carotid stenosis measurements (when applicable) are obtained utilizing NASCET criteria, using the distal internal carotid diameter as the denominator. CONTRAST:  50 mL Isovue 370 IV COMPARISON:  MRI 04/02/2016 FINDINGS: Aortic arch: Normal aortic arch. Proximal great vessels widely patent. Right carotid system: Normal right carotid. Negative for atherosclerotic disease or dissection. Left carotid system: Normal left carotid. Negative for atherosclerotic disease or dissection. Vertebral arteries:Normal vertebral arteries bilaterally. Skeleton: Mild cervical spine degenerative change. No fracture or mass lesion. Multiple dental caries. Other neck: Negative for mass or adenopathy. Lung apices are clear.  Mild subpleural scarring bilaterally. IMPRESSION: Negative CTA of the neck. No significant carotid or vertebral artery disease. Electronically Signed   By: Franchot Gallo M.D.   On: 04/03/2016 10:29   Mr Virgel Paling Wo Contrast  04/02/2016  CLINICAL DATA:  55 year old male with history of pulmonary embolus, new onset atrial fibrillation and intermittent tingling of face bilaterally, left arm and foot. Subsequent encounter. EXAM: MRI HEAD WITHOUT CONTRAST MRA HEAD WITHOUT CONTRAST TECHNIQUE: Multiplanar, multiecho pulse sequences of the brain and surrounding structures were obtained without intravenous  contrast. Angiographic images of the head were obtained using MRA technique without contrast. COMPARISON:  04/02/2016 CT.  No  comparison MR. FINDINGS: MRI HEAD FINDINGS No acute infarct or intracranial hemorrhage. No intracranial mass lesion noted on this unenhanced exam. Global atrophy most prominent parietal lobe region. No hydrocephalus. Major intracranial vascular structures are patent. No acute orbital abnormality.  Minimal exophthalmos. Minimal to mild paranasal sinus mucosal thickening most notable inferior aspect right maxillary sinus measuring up to 6 mm. Cervical medullary junction unremarkable. MRA HEAD FINDINGS Anterior circulation without medium or large size vessel significant stenosis or occlusion. Mild ectasia portions of the carotid arteries. Middle cerebral artery mild branch vessel irregularity. Left vertebral artery is dominant. No significant stenosis of either distal vertebral artery or basilar artery. Minimal irregularity portions of the posterior inferior cerebellar arteries. Anterior inferior cerebellar arteries not visualized. IMPRESSION: MRI HEAD No acute infarct or intracranial hemorrhage. No intracranial mass lesion noted on this unenhanced exam. Global atrophy most prominent parietal lobe region. No hydrocephalus. Mild paranasal sinus mucosal thickening most notable inferior aspect right maxillary sinus measuring up to 6 mm. MRA HEAD FINDINGS Anterior circulation without medium or large size vessel significant stenosis or occlusion. Mild ectasia portions of the carotid arteries. Middle cerebral artery mild branch vessel irregularity. No significant stenosis of either distal vertebral artery or basilar artery. Minimal irregularity portions of the posterior inferior cerebellar arteries. Anterior inferior cerebellar arteries not visualized. Electronically Signed   By: Genia Del M.D.   On: 04/02/2016 17:17   Mr Brain Wo Contrast  04/02/2016  CLINICAL DATA:  55 year old male with history of pulmonary embolus, new onset atrial fibrillation and intermittent tingling of face bilaterally, left arm and foot.  Subsequent encounter. EXAM: MRI HEAD WITHOUT CONTRAST MRA HEAD WITHOUT CONTRAST TECHNIQUE: Multiplanar, multiecho pulse sequences of the brain and surrounding structures were obtained without intravenous contrast. Angiographic images of the head were obtained using MRA technique without contrast. COMPARISON:  04/02/2016 CT.  No comparison MR. FINDINGS: MRI HEAD FINDINGS No acute infarct or intracranial hemorrhage. No intracranial mass lesion noted on this unenhanced exam. Global atrophy most prominent parietal lobe region. No hydrocephalus. Major intracranial vascular structures are patent. No acute orbital abnormality.  Minimal exophthalmos. Minimal to mild paranasal sinus mucosal thickening most notable inferior aspect right maxillary sinus measuring up to 6 mm. Cervical medullary junction unremarkable. MRA HEAD FINDINGS Anterior circulation without medium or large size vessel significant stenosis or occlusion. Mild ectasia portions of the carotid arteries. Middle cerebral artery mild branch vessel irregularity. Left vertebral artery is dominant. No significant stenosis of either distal vertebral artery or basilar artery. Minimal irregularity portions of the posterior inferior cerebellar arteries. Anterior inferior cerebellar arteries not visualized. IMPRESSION: MRI HEAD No acute infarct or intracranial hemorrhage. No intracranial mass lesion noted on this unenhanced exam. Global atrophy most prominent parietal lobe region. No hydrocephalus. Mild paranasal sinus mucosal thickening most notable inferior aspect right maxillary sinus measuring up to 6 mm. MRA HEAD FINDINGS Anterior circulation without medium or large size vessel significant stenosis or occlusion. Mild ectasia portions of the carotid arteries. Middle cerebral artery mild branch vessel irregularity. No significant stenosis of either distal vertebral artery or basilar artery. Minimal irregularity portions of the posterior inferior cerebellar arteries.  Anterior inferior cerebellar arteries not visualized. Electronically Signed   By: Genia Del M.D.   On: 04/02/2016 17:17    Micro Results     No results found for this or any previous visit (from the past 240  hour(s)).     Today   Subjective:   Jamey Keeven today has no headache,no chest abdominal pain, feels much better wants to go home today.   Objective:   Blood pressure 111/76, pulse 60, temperature 97.9 F (36.6 C), temperature source Oral, resp. rate 16, height 5\' 9"  (1.753 m), weight 86.183 kg (190 lb), SpO2 100 %.   Intake/Output Summary (Last 24 hours) at 04/03/16 1501 Last data filed at 04/03/16 0818  Gross per 24 hour  Intake    860 ml  Output      0 ml  Net    860 ml    Exam Awake Alert, Oriented x 3, No new F.N deficits, Normal affect Supple Neck,No JVD,  Symmetrical Chest wall movement, Good air movement bilaterally,  Irregular,No Gallops,Rubs , No Parasternal Heave +ve B.Sounds, Abd Soft, Non tender,  No Cyanosis, Clubbing or edema, No new Rash or bruise  Data Review   CBC w Diff:  Lab Results  Component Value Date   WBC 3.3* 04/02/2016   WBC 2.1* 02/02/2016   WBC 2.3* 08/18/2015   HGB 12.1* 04/02/2016   HGB 11.7* 02/02/2016   HCT 37.8* 04/02/2016   HCT 37.2* 02/02/2016   HCT 39.8 08/18/2015   PLT 125* 04/02/2016   PLT 141 02/02/2016   PLT 162 08/18/2015   LYMPHOPCT 17.4 02/02/2016   LYMPHOPCT 34 09/15/2015   MONOPCT 16.8* 02/02/2016   MONOPCT 15* 09/15/2015   EOSPCT 6.6 02/02/2016   EOSPCT 2 09/15/2015   EOSPCT 0 12/10/2011   BASOPCT 2.3* 02/02/2016   BASOPCT 1 09/15/2015    CMP:  Lab Results  Component Value Date   NA 139 04/02/2016   NA 142 02/02/2016   NA 140 08/18/2015   NA 137 06/30/2012   K 4.2 04/02/2016   K 4.5 02/02/2016   K 3.8 06/30/2012   CL 102 04/02/2016   CL 98 12/28/2012   CL 97* 06/30/2012   CO2 27 04/02/2016   CO2 26 02/02/2016   CO2 30 06/30/2012   BUN 13 04/02/2016   BUN 16.0 02/02/2016   BUN 14  08/18/2015   BUN 14 06/30/2012   CREATININE 0.96 04/02/2016   CREATININE 1.1 02/02/2016   CREATININE 1.10 09/15/2015   PROT 6.7 02/02/2016   PROT 6.6 09/15/2015   PROT 6.9 08/18/2015   PROT 6.9 06/30/2012   ALBUMIN 3.7 02/02/2016   ALBUMIN 4.1 09/15/2015   ALBUMIN 4.5 08/18/2015   BILITOT 0.64 02/02/2016   BILITOT 0.9 09/15/2015   BILITOT 0.4 08/18/2015   BILITOT 1.60 06/30/2012   ALKPHOS 34* 02/02/2016   ALKPHOS 35* 09/15/2015   ALKPHOS 43 06/30/2012   AST 53* 02/02/2016   AST 48* 09/15/2015   AST 62* 06/30/2012   ALT 40 02/02/2016   ALT 35 09/15/2015   ALT 39 06/30/2012  .   Total Time in preparing paper work, data evaluation and todays exam - 35 minutes  Dois Juarbe M.D on 04/03/2016 at 3:01 PM  [facility]   Office  [phone]

## 2016-04-06 LAB — HEMOGLOBIN A1C
Hgb A1c MFr Bld: 6.1 % — ABNORMAL HIGH (ref 4.8–5.6)
Mean Plasma Glucose: 128 mg/dL

## 2016-04-07 ENCOUNTER — Ambulatory Visit: Payer: No Typology Code available for payment source | Admitting: Cardiovascular Disease

## 2016-04-07 NOTE — Telephone Encounter (Signed)
Leonard Bentley at 03/24/2016 4:37 PM     Status: Signed       Expand All Collapse All   Left message to call back

## 2016-04-08 ENCOUNTER — Ambulatory Visit: Payer: No Typology Code available for payment source | Admitting: Physician Assistant

## 2016-04-08 ENCOUNTER — Other Ambulatory Visit: Payer: Self-pay | Admitting: Thoracic Surgery (Cardiothoracic Vascular Surgery)

## 2016-04-09 ENCOUNTER — Ambulatory Visit
Admission: RE | Admit: 2016-04-09 | Discharge: 2016-04-09 | Disposition: A | Discharge status: Home / Self Care | Payer: No Typology Code available for payment source | Source: Ambulatory Visit | Attending: Thoracic Surgery (Cardiothoracic Vascular Surgery) | Admitting: Thoracic Surgery (Cardiothoracic Vascular Surgery)

## 2016-04-09 ENCOUNTER — Telehealth: Payer: Self-pay | Admitting: Cardiovascular Disease

## 2016-04-09 ENCOUNTER — Ambulatory Visit (INDEPENDENT_AMBULATORY_CARE_PROVIDER_SITE_OTHER): Payer: No Typology Code available for payment source | Admitting: Cardiovascular Disease

## 2016-04-09 ENCOUNTER — Telehealth: Payer: Self-pay | Admitting: *Deleted

## 2016-04-09 ENCOUNTER — Encounter: Payer: Self-pay | Admitting: Cardiovascular Disease

## 2016-04-09 VITALS — Ht 69.0 in | Wt 186.6 lb

## 2016-04-09 DIAGNOSIS — I340 Nonrheumatic mitral (valve) insufficiency: Secondary | ICD-10-CM

## 2016-04-09 DIAGNOSIS — I2699 Other pulmonary embolism without acute cor pulmonale: Secondary | ICD-10-CM | POA: Diagnosis not present

## 2016-04-09 DIAGNOSIS — I491 Atrial premature depolarization: Secondary | ICD-10-CM | POA: Diagnosis not present

## 2016-04-09 DIAGNOSIS — I441 Atrioventricular block, second degree: Secondary | ICD-10-CM | POA: Diagnosis not present

## 2016-04-09 DIAGNOSIS — I2609 Other pulmonary embolism with acute cor pulmonale: Secondary | ICD-10-CM | POA: Diagnosis not present

## 2016-04-09 MED ORDER — IOPAMIDOL (ISOVUE-370) INJECTION 76%
75.0000 mL | Freq: Once | INTRAVENOUS | Status: AC | PRN
  Administered 2016-04-09: 75 mL via INTRAVENOUS

## 2016-04-09 MED ORDER — RIVAROXABAN 20 MG PO TABS: 20.0000 mg | ORAL_TABLET | Freq: Every day | ORAL | Status: DC

## 2016-04-09 NOTE — Telephone Encounter (Signed)
Mr. Leonard Bentley was noted to have pulmonary embolism on CT today.  He reports some shortness of breath and episodes of dizziness. He has a history of prior pulmonary embolism that was thought to be due to taking testosterone at that time. He will come today for a nurse visit so that we can assess vital signs and determine whether he is safe to start outpatient anticoagulation.  Leonard Mattern C. Oval Linsey, MD, [facility]  04/09/2016  1:43 PM

## 2016-04-09 NOTE — Progress Notes (Signed)
Cardiology Office Note   Date:  04/11/2016   ID:  Leonard Bentley, DOB [birth year], MRN [id number]  PCP:  Leonard Haste, MD  Cardiologist:   Leonard Latch, MD   No chief complaint on file.     Patient ID: Leonard Bentley is a 55 y.o. male with prior tobacco abuse, prior PE, and non-Hodgkin's lymphoma s/p chemotherapy who presents for follow up.  History of Present Illness:  Leonard Bentley was initially evaluated 10/2015 after his PCP, Leonard Bentley, noted that he had Mobitz I.  He was referred to cardiology, where he reported that he wasn't able to run as long as he used to.  He was referred for exercise Cardiolite that revealed LVEF 52% and no ischemia. On 01/20/16 he had an echo that was revealed LVEF 60-65% with moderate LVH and posterior mitral valve leaflet prolapse. There was moderate to severe mitral regurgitation.  He was referred for transesophageal echocardiography which confirmed moderate to severe mitral regurgitation.  He was referred to Leonard Bentley for consideration of mitral valve replacement/repair.  He had a chest CT for preoperative assessment and was noted to have a pulmonary embolism in the right lower lobe.  Leonard Bentley had a PE several years ago in the setting of testosterone therapy.   Leonard Bentley reports  that for the last 2 weeks he has noted sharp chest pain. Recur several times daily. He continues to run but has had to slow down. He is now only able to run for 2 miles and it takes him 30 minutes. In the past he was able to run for 5 miles. He denies any lower extremity edema, orthopnea or PND. He notes some mild shortness of breath.  He also notes dizziness that is worse with positional changes.    Past Medical History  Diagnosis Date  . ED (erectile dysfunction)   . Pulmonary emboli ([place])   . Depression   . Cancer ([place]) 07/2011    STAGE II NON- HODGKIN LYMPHOMA  . Lymphoma, non Hodgkin's 09/25/2011  . Mobitz I 10/09/2015  . PAC (premature atrial contraction) 10/09/2015    . Atrial tachycardia ([place]) 01/06/2016  . Murmur 01/06/2016  . Mitral regurgitation 01/20/2016  . Acute pulmonary embolism ([place]) 04/11/2016    Past Surgical History  Procedure Laterality Date  . No past surgeries    . Trigger finger release Bilateral   . Tee without cardioversion N/A 02/11/2016    Procedure: TRANSESOPHAGEAL ECHOCARDIOGRAM (TEE);  Surgeon: Leonard Latch, MD;  Location: [facility] ENDOSCOPY;  Service: Cardiovascular;  Laterality: N/A;     Current Outpatient Prescriptions  Medication Sig Dispense Refill  . ALPRAZolam (XANAX) 0.25 MG tablet TAKE ONE TABLET BY MOUTH TWICE DAILY AS NEEDED FOR ANXIETY 15 tablet 0  . atorvastatin (LIPITOR) 20 MG tablet Take 1 tablet (20 mg total) by mouth daily. (Patient taking differently: Take 20 mg by mouth daily. Has not started yet.) 30 tablet 0  . busPIRone (BUSPAR) 7.5 MG tablet Take 1 tablet (7.5 mg total) by mouth 2 (two) times daily. (Patient taking differently: Take 7.5 mg by mouth 2 (two) times daily. Has not started yet.) 60 tablet 0  . folic acid (FOLVITE) 1 MG tablet Take 1 tablet (1 mg total) by mouth daily. (Patient taking differently: Take 1 mg by mouth daily. Has not started yet.) 30 tablet 0  . Rivaroxaban (XARELTO) 15 MG TABS tablet Take 15 mg by mouth 2 (two) times daily with a meal. FOR 21 DAYS AND  THEN START THE XARELTO 20 MG DAILY    . thiamine 100 MG tablet Take 1 tablet (100 mg total) by mouth daily. 30 tablet 0  . rivaroxaban (XARELTO) 20 MG TABS tablet Take 1 tablet (20 mg total) by mouth daily with supper. 30 tablet 5   No current facility-administered medications for this visit.    Allergies:   Review of patient's allergies indicates no known allergies.    Social History:  The patient  reports that he quit smoking about 2 years ago. His smoking use included Cigarettes. He has a 32 pack-year smoking history. He uses smokeless tobacco. He reports that he drinks alcohol. He reports that he does not use illicit drugs.    Family History:  The patient's family history includes Cancer in his father and mother.    ROS:  Please see the history of present illness.   Otherwise, review of systems are positive for none.   All other systems are reviewed and negative.    PHYSICAL EXAM: VS:  BP   Ht 5\' 9"  (1.753 m)  Wt 186 lb 9.6 oz (84.641 kg)  BMI 27.54 kg/m2  SpO2 99% , BMI Body mass index is 27.54 kg/(m^2). GENERAL:  Well appearing HEENT:  Pupils equal round and reactive, fundi not visualized, oral mucosa unremarkable NECK:  No jugular venous distention, waveform within normal limits, carotid upstroke brisk and symmetric, no bruits LYMPHATICS:  No cervical adenopathy LUNGS:  Clear to auscultation bilaterally HEART:  RRR.  PMI not displaced or sustained,S1 and S2 within normal limits, + S3, no S4, no clicks, no rubs, II/VI holosystolic murmur at the apex ABD:  Flat, positive bowel sounds normal in frequency in pitch, no bruits, no rebound, no guarding, no midline pulsatile mass, no hepatomegaly, no splenomegaly EXT:  2 plus pulses throughout, no edema, no cyanosis no clubbing SKIN:  No rashes no nodules NEURO:  Cranial nerves II through XII grossly intact, motor grossly intact throughout PSYCH:  Cognitively intact, oriented to person place and time   EKG:  EKG is ordered today. 04/09/16: Sinus arrhythmia. Rate 92 bpm. First degree AV block. 09/15/15: sinus rhythm rate 87 bpm. PACs. Second degree heart block, Mobitz I.  LAFB  Exercise Cardiolite 10/15/15:  Nuclear stress EF: 52%.  There was no ST segment deviation noted during stress.  No T wave inversion was noted during stress.  The study is normal.  This is a low risk study.  Low risk stress nuclear study with normal perfusion and normal left ventricular regional systolic function. Borderline global LVEF 52%.  Echo 01/20/16: Study Conclusions  - Left ventricle: The cavity size was normal. Wall thickness was  increased in a pattern of  moderate LVH. Systolic function was  normal. The estimated ejection fraction was in the range of 60%  to 65%. Wall motion was normal; there were no regional wall  motion abnormalities. The study is not technically sufficient to  allow evaluation of LV diastolic function. - Aortic valve: Sclerosis without stenosis. There was trivial  regurgitation. - Mitral valve: Mildly thickened leaflets with predominate prolapse  of the posterior leaflet and anteriorly directed mitral  regurgitation. There was moderate to severe eccentric  regurgitation directed at the atrial septum. - Left atrium: Massively dilated at 85 ml/m2. - Tricuspid valve: There was mild regurgitation. - Pulmonary arteries: PA peak pressure: 27 mm Hg (S). - Inferior vena cava: The vessel was normal in size. The  respirophasic diameter changes were in the normal range (>= 50%),  consistent with normal central venous pressure.  Impressions:  - LVEF 60-65%, moderate LVH, trivial AI, moderate to more likely  severe eccentric MR directed anteriorly and to the interatrial  septum, posterior mitral valve leaflet prolpase, massive LAE,  mild TR, RVSP 27 mmHg, normal IVC.  TEE 02/11/16: - Left ventricle: Systolic function was normal. The estimated  ejection fraction was in the range of 60% to 65%. Wall motion was  normal; there were no regional wall motion abnormalities. - Aortic valve: There was no regurgitation. - Mitral valve: Mild prolapse, involving the middle scallop of the  posterior leaflet. There was moderate to severe regurgitation  directed eccentrically and anteriorly. Valve area by continuity  equation (using LVOT flow): 2.55 cm^2. Effective regurgitant  orifice (PISA): 0.33 cm^2. Regurgitant volume (PISA): 48 ml. - Left atrium: No evidence of thrombus in the atrial cavity or  appendage. No evidence of thrombus in the atrial cavity or  appendage. - Right atrium: No evidence of thrombus in the  atrial cavity or  appendage. - Atrial septum: No defect or patent foramen ovale was identified  on saline microcavitation study. - Tricuspid valve: There was no significant regurgitation.   Recent Labs: 10/10/2015: TSH 0.687 02/02/2016: ALT 40 04/02/2016: BUN 13; Creatinine, Ser 0.96; Hemoglobin 12.1*; Platelets 125*; Potassium 4.2; Sodium 139    Lipid Panel    Component Value Date/Time   CHOL 239* 04/03/2016 0323   CHOL 238* 08/18/2015 0825   TRIG 63 04/03/2016 0323   HDL 126 04/03/2016 0323   HDL 124 08/18/2015 0825   CHOLHDL 1.9 04/03/2016 0323   CHOLHDL 1.9 08/18/2015 0825   VLDL 13 04/03/2016 0323   LDLCALC 100* 04/03/2016 0323   LDLCALC 94 08/18/2015 0825      Wt Readings from Last 3 Encounters:  04/09/16 186 lb 9.6 oz (84.641 kg)  04/02/16 190 lb (86.183 kg)  02/23/16 187 lb (84.823 kg)      ASSESSMENT AND PLAN:  # Pulmonary Embolism: Mr. Leeder was incidentally noted to have pulmonary embolism on CT scan today.  This is his second time having a PE.  The first occurred in the setting of testosterone use.  There is no clear cause this time.  He will need lifelong anticoagulation.  We will start Xarelto 15 mg bid x21 days followed by 20 mg daily.  His oxygen saturation is 99% and he does not have evidence of heart failure on exam.  Therefore, It is safe for him to be treated as an outpatient. We will stop his metoprolol as he was profoundly orthostatic.  # PAT, PACs: Symptoms have improved since starting metoprolol 12.5 mg q12h.  However, he is very orthostatic on exam today. Therefore we will stop metoprolol.  # Moderate-Severe Mitral Regurgitation: Mr. Jelks was in the process of being worked up for mitral valve repair/replacement. However, given that he has a new pulmonary embolism he will need to be anticoagulated. We have canceled his right and left heart catheterization. We discussed the fact the surgery will be deferred until after he is treated for the PE.  RHC is not  a good idea at this time.  Surgery can be reconsidered in 3 months or when deemed safe by Leonard Bentley.    Current medicines are reviewed at length with the patient today.  The patient does not have concerns regarding medicines.  The following changes have been made:  Stop metoprolol.  Labs/ tests ordered today include:   Orders Placed This Encounter  Procedures  .  EKG 12-Lead     Disposition:   FU in1 week.   Signed, Shaneice Barsanti C. Oval Linsey, MD, [facility]  04/11/2016 12:20 AM    [place] and

## 2016-04-09 NOTE — Telephone Encounter (Signed)
Spoke to Dr Oval Linsey, Per Dr Hampton Abbot states she will call patient.

## 2016-04-09 NOTE — Telephone Encounter (Signed)
xarelto 15 mg #1 LOT [id number] EXP 10/17             #1 LOT #[id number] EXP 3/18             #1 LOT #[id number] EXP 10/17

## 2016-04-09 NOTE — Patient Instructions (Signed)
Medication Instructions:  STOP METOPROLOL   STOP ASPIRIN   START XARELTO 15 MG TWICE A [place] FOR 21 DAYS THEN START XARELTO 20 MG DAILY   Labwork: NONE  Testing/Procedures: NONE  Follow-Up: 1 WEEK WITH NP/PA  Any Other Special Instructions Will Be Listed Below (If Applicable). YOUR CATH HAS BEEN CANCELLED FOR NOW   If you need a refill on your cardiac medications before your next appointment, please call your pharmacy.

## 2016-04-09 NOTE — Telephone Encounter (Signed)
FOLLOW UP   Pt calling he verbalized that he is returning call for Dr.[place]

## 2016-04-11 ENCOUNTER — Encounter: Payer: Self-pay | Admitting: Cardiovascular Disease

## 2016-04-11 DIAGNOSIS — Z86711 Personal history of pulmonary embolism: Secondary | ICD-10-CM | POA: Insufficient documentation

## 2016-04-11 DIAGNOSIS — I2699 Other pulmonary embolism without acute cor pulmonale: Secondary | ICD-10-CM

## 2016-04-11 HISTORY — DX: Other pulmonary embolism without acute cor pulmonale: I26.99

## 2016-04-14 NOTE — Telephone Encounter (Signed)
Patient seen by Dr Oval Linsey on 6/2

## 2016-04-14 NOTE — Telephone Encounter (Signed)
Patient seen by Dr Oval Linsey

## 2016-04-15 ENCOUNTER — Encounter: Payer: Self-pay | Admitting: Physician Assistant

## 2016-04-15 ENCOUNTER — Ambulatory Visit (INDEPENDENT_AMBULATORY_CARE_PROVIDER_SITE_OTHER): Payer: No Typology Code available for payment source | Admitting: Physician Assistant

## 2016-04-15 ENCOUNTER — Encounter (HOSPITAL_COMMUNITY): Admission: RE | Payer: Self-pay | Source: Ambulatory Visit

## 2016-04-15 ENCOUNTER — Ambulatory Visit (HOSPITAL_COMMUNITY)
Admission: RE | Admit: 2016-04-15 | Payer: No Typology Code available for payment source | Source: Ambulatory Visit | Admitting: Cardiovascular Disease

## 2016-04-15 VITALS — BP 116/78 | HR 109 | Ht 69.0 in | Wt 186.0 lb

## 2016-04-15 DIAGNOSIS — R0789 Other chest pain: Secondary | ICD-10-CM | POA: Diagnosis not present

## 2016-04-15 DIAGNOSIS — I340 Nonrheumatic mitral (valve) insufficiency: Secondary | ICD-10-CM | POA: Diagnosis not present

## 2016-04-15 DIAGNOSIS — I2699 Other pulmonary embolism without acute cor pulmonale: Secondary | ICD-10-CM

## 2016-04-15 SURGERY — RIGHT/LEFT HEART CATH AND CORONARY ANGIOGRAPHY

## 2016-04-15 NOTE — Patient Instructions (Signed)
Medication Instructions:  1) RESTART your Lipitor 20mg  once daily  Labwork: Your physician recommends that you return for lab work in: 1 week (CBC)   Testing/Procedures: Non-Cardiac CT Angiography (CTA), is a special type of CT scan that uses a computer to produce multi-dimensional views of major blood vessels throughout the body. In CT angiography, a contrast material is injected through an IV to help visualize the blood vessels   Follow-Up: Your physician recommends that you schedule a follow-up appointment in: 3 months with Dr. Oval Linsey.   Any Other Special Instructions Will Be Listed Below (If Applicable).     If you need a refill on your cardiac medications before your next appointment, please call your pharmacy.

## 2016-04-15 NOTE — Progress Notes (Addendum)
CARDIOLOGY OFFICE NOTE  Date:  04/15/2016    Leonard Bentley Date of Birth: Apr 25, 1961 Medical Record [id number]  PCP:  Wyatt Haste, MD  Cardiologist:    Dr. Oval Linsey  Chief Complaint  Patient presents with  . Follow-up    seen for Dr. Oval Linsey    History of Present Illness: Leonard Bentley is a 55 y.o. male who presents today for a cardiology visit. He was referred to cardiology group in December 2016 after his PCP noted he had Mobitz 1. During the visit, he complained of exercise intolerance, he was referred for exercise Cardiolite which revealed EF 52% with no ischemia. Echocardiogram obtained on 01/20/2016 showed EF 60-65%, moderate LVH and posterior mitral of leaflet prolapse, moderate to severe mitral regurg. He was referred for TEE which confirmed moderate to severe mitral regurg. He was initially referred to Dr. Roxy Manns for mitral valve replacement/repair. However he had a preoperative chest CT which noted to have PE in the right lower lobe. During the mean time, he has been having occasional sharp chest pains for the past 2 weeks. Given the presence of PE, left and right heart cath was canceled. He was placed on Xarelto. Valve replacement surgery has been delayed until after PE resolved especially since he is unable to have right heart cath. Note, patient had a prior history of PE, given this recurrent episode, he will likely require long-term systemic anticoagulation.  He presents today for 1 week follow-up. He says he has been taking the Xarelto, however did miss last night dose. Otherwise, he continued to have some degree of very atypical chest discomfort, he says it moves around in the upper chest, in the abdomen, the flank area. Only appears to occur 5-10 minutes each. Does not seems to be related to exertion and is not exacerbated by any deep inspiration, body palpation or rotation. He still has dizziness. His heart rate is borderline elevated today.    Past Medical History    Diagnosis Date  . ED (erectile dysfunction)   . Pulmonary emboli ([place])   . Depression   . Cancer ([place]) 07/2011    STAGE II NON- HODGKIN LYMPHOMA  . Lymphoma, non Hodgkin's 09/25/2011  . Mobitz I 10/09/2015  . PAC (premature atrial contraction) 10/09/2015  . Atrial tachycardia ([place]) 01/06/2016  . Murmur 01/06/2016  . Mitral regurgitation 01/20/2016  . Acute pulmonary embolism ([place]) 04/11/2016    Past Surgical History  Procedure Laterality Date  . No past surgeries    . Trigger finger release Bilateral   . Tee without cardioversion N/A 02/11/2016    Procedure: TRANSESOPHAGEAL ECHOCARDIOGRAM (TEE);  Surgeon: [name], MD;  Location: [facility] ENDOSCOPY;  Service: Cardiovascular;  Laterality: N/A;     Medications: Current Outpatient Prescriptions  Medication Sig Dispense Refill  . ALPRAZolam (XANAX) 0.25 MG tablet TAKE ONE TABLET BY MOUTH TWICE DAILY AS NEEDED FOR ANXIETY 15 tablet 0  . atorvastatin (LIPITOR) 20 MG tablet Take 1 tablet (20 mg total) by mouth daily. (Patient taking differently: Take 20 mg by mouth daily. Has not started yet.) 30 tablet 0  . busPIRone (BUSPAR) 7.5 MG tablet Take 1 tablet (7.5 mg total) by mouth 2 (two) times daily. (Patient taking differently: Take 7.5 mg by mouth 2 (two) times daily. Has not started yet.) 60 tablet 0  . folic acid (FOLVITE) 1 MG tablet Take 1 tablet (1 mg total) by mouth daily. (Patient taking differently: Take 1 mg by mouth daily. Has not started yet.)  30 tablet 0  . Rivaroxaban (XARELTO) 15 MG TABS tablet Take 15 mg by mouth 2 (two) times daily with a meal. FOR 21 DAYS AND THEN START THE XARELTO 20 MG DAILY    . rivaroxaban (XARELTO) 20 MG TABS tablet Take 1 tablet (20 mg total) by mouth daily with supper. 30 tablet 5  . thiamine 100 MG tablet Take 1 tablet (100 mg total) by mouth daily. 30 tablet 0   No current facility-administered medications for this visit.    Allergies: No Known Allergies  Social History: The patient  reports  that he quit smoking about 2 years ago. His smoking use included Cigarettes. He has a 32 pack-year smoking history. He uses smokeless tobacco. He reports that he drinks alcohol. He reports that he does not use illicit drugs.   Family History: The patient's family history includes Cancer in his father and mother; Hypertension in his maternal grandmother; Stroke in his maternal aunt. There is no history of Heart attack.   Review of Systems: Please see the history of present illness.   Otherwise, the review of systems is positive for dizziness, SOB.   All other systems are reviewed and negative.   Physical Exam: VS:  BP 116/78 mmHg  Pulse 109  Ht 5\' 9"  (1.753 m)  Wt 186 lb (84.369 kg)  BMI 27.45 kg/m2 .  BMI Body mass index is 27.45 kg/(m^2).  Wt Readings from Last 3 Encounters:  04/15/16 186 lb (84.369 kg)  04/09/16 186 lb 9.6 oz (84.641 kg)  04/02/16 190 lb (86.183 kg)    General: Pleasant. Well developed, well nourished and in no acute distress.  HEENT: Normal. Neck: Supple, no JVD, carotid bruits, or masses noted.  Cardiac: tachycardic, regular. No murmurs, rubs, or gallops. No edema.  Respiratory:  Lungs are clear to auscultation bilaterally with normal work of breathing.  GI: Soft and nontender.  MS: No deformity or atrophy. Gait and ROM intact. Skin: Warm and dry. Color is normal.  Neuro:  Strength and sensation are intact and no gross focal deficits noted.  Psych: Alert, appropriate and with normal affect.   LABORATORY DATA:  EKG:  EKG is not ordered today.   Lab Results  Component Value Date   WBC 3.3* 04/02/2016   HGB 12.1* 04/02/2016   HCT 37.8* 04/02/2016   PLT 125* 04/02/2016   GLUCOSE 109* 04/02/2016   CHOL 239* 04/03/2016   TRIG 63 04/03/2016   HDL 126 04/03/2016   LDLCALC 100* 04/03/2016   ALT 40 02/02/2016   AST 53* 02/02/2016   NA 139 04/02/2016   K 4.2 04/02/2016   CL 102 04/02/2016   CREATININE 0.96 04/02/2016   BUN 13 04/02/2016   CO2 27  04/02/2016   TSH 0.687 10/10/2015   PSA 1.29 03/08/2012   INR 1.04 12/28/2012   HGBA1C 6.1* 04/03/2016     Other Studies Reviewed Today:  CTA of chest   IMPRESSION: 1. Right lower lobe acute pulmonary embolus, new since previous exam.  2. Progression of some mediastinal adenopathy and miliary opacities in the right lung since previous study, suggesting residual/recurrent disease given history of lymphoma.  These results will be called to the ordering clinician or representative by the Radiologist Assistant, and communication documented in the PACS or zVision Dashboard.  3. Tortuous aortoiliac arterial system without dissection or stenosis. 4. 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 5. 17 mm ectatic external iliac arteries bilaterally.   Echo 01/20/2016 LV EF:  60% - 65%  ------------------------------------------------------------------- Indications: (R06.00).  ------------------------------------------------------------------- History: PMH: Acquired from the patient and from the patient&'s chart. Dyspnea and murmur. Risk factors: Current tobacco use.  ------------------------------------------------------------------- Study Conclusions  - Left ventricle: The cavity size was normal. Wall thickness was  increased in a pattern of moderate LVH. Systolic function was  normal. The estimated ejection fraction was in the range of 60%  to 65%. Wall motion was normal; there were no regional wall  motion abnormalities. The study is not technically sufficient to  allow evaluation of LV diastolic function. - Aortic valve: Sclerosis without stenosis. There was trivial  regurgitation. - Mitral valve: Mildly thickened leaflets with predominate prolapse  of the posterior leaflet and anteriorly directed mitral  regurgitation. There was moderate to severe eccentric  regurgitation directed at the atrial septum. - Left atrium:  Massively dilated at 85 ml/m2. - Tricuspid valve: There was mild regurgitation. - Pulmonary arteries: PA peak pressure: 27 mm Hg (S). - Inferior vena cava: The vessel was normal in size. The  respirophasic diameter changes were in the normal range (>= 50%),  consistent with normal central venous pressure.  Impressions:  - LVEF 60-65%, moderate LVH, trivial AI, moderate to more likely  severe eccentric MR directed anteriorly and to the interatrial  septum, posterior mitral valve leaflet prolpase, massive LAE,  mild TR, RVSP 27 mmHg, normal IVC.   Myoview 10/15/2015 Study Highlights     Nuclear stress EF: 52%.  There was no ST segment deviation noted during stress.  No T wave inversion was noted during stress.  The study is normal.  This is a low risk study.  Low risk stress nuclear study with normal perfusion and normal left ventricular regional systolic function. Borderline global LVEF 52%.     Assessment/Plan:   1. Recurrent PE:  - appears to be incidental finding during workup for valve surgery. RLL PE  - discussed with causes of PE, he is very active, and has not had prolonged period where he stayed still. Very surprising to find recurrent PE. He had previous PE in the setting of testosterone therapy  - continue on Xarelto, he is still on 15mg  BID dosing, and plan to switch to 20mg  daily dosing after 21 days  - given no obvious trigger for PE, he will need chronic systemic anticoagulation. May also require genetic workup Factor V Leidon and Protein C&S deficiency  - I will repeat CTA in 3 month, if negative, will arrange L&RHC again. Can't do RHC given pulm artery clot  - he is mildly tachycardic today, but given his dizziness, I am hesitant to add metoprolol which may cause worsening dizziness. Expect tachycardia to improve with resolution of PE.  - given use of Xarelto, recheck CBC in 1 week  2. Moderate to severe MR  - evaluated by Dr. Roxy Manns, previously  planned for [facility], however delayed given new PE. Continue to followup with Dr. Roxy Manns. Workup delayed by recurrent PE  3.  Chest pain, atypical  - he continue to have atypical chest pain, which tend to occur at several locations. Recent negative myoview reassuring.    Current medicines are reviewed with the patient today.  The patient does not have concerns regarding medicines other than what has been noted above.  The following changes have been made:  See above.  Labs/ tests ordered today include:    Orders Placed This Encounter  Procedures  . CT Angio Chest PE W/Cm &/Or Wo Cm  . CBC  Disposition:   FU with Dr. Oval Linsey in 3 months.   Patient is agreeable to this plan and will call if any problems develop in the interim.   Signed: Almyra Deforest PA-C 04/15/2016 10:27 AM  [place] [address] [place], [place]  [address] Phone: [phone] Fax: [phone]

## 2016-04-19 ENCOUNTER — Ambulatory Visit: Payer: No Typology Code available for payment source | Admitting: Thoracic Surgery (Cardiothoracic Vascular Surgery)

## 2016-04-20 ENCOUNTER — Telehealth: Payer: Self-pay | Admitting: Cardiovascular Disease

## 2016-04-20 NOTE — Telephone Encounter (Signed)
Returned call, notified pt fax would be sent to number provided. Asked if I could address anything further for him. Pt had a question about FMLA and I advised to contact employer HR. Pt verbalized understanding.

## 2016-04-20 NOTE — Telephone Encounter (Signed)
New Message   Pt called, states that he is due to have labs on Thursday and he has a way to get his labs completed with the occupational nurse at his place of employment.  Please fax orders to: [phone]  Please call the pt to confirm that the fax was sent.

## 2016-04-20 NOTE — Telephone Encounter (Signed)
Patient brought in [place] for Dr Oval Linsey to sign.  Received signed AUTH/FMLA Forms/Check and forwarded to [employer] @ [place] for processing.  Sent Via Courier 04/20/16. lp

## 2016-04-22 ENCOUNTER — Other Ambulatory Visit: Payer: Self-pay | Admitting: Family Medicine

## 2016-04-22 ENCOUNTER — Other Ambulatory Visit: Payer: No Typology Code available for payment source

## 2016-04-22 ENCOUNTER — Telehealth: Payer: Self-pay | Admitting: Family Medicine

## 2016-04-22 MED ORDER — ALPRAZOLAM 0.25 MG PO TABS: 0.2500 mg | ORAL_TABLET | Freq: Two times a day (BID) | ORAL | Status: DC | PRN

## 2016-04-22 NOTE — Telephone Encounter (Signed)
ok 

## 2016-04-22 NOTE — Telephone Encounter (Signed)
Is this okay to refill? 

## 2016-04-22 NOTE — Telephone Encounter (Signed)
Pls call it out, no refill

## 2016-04-22 NOTE — Telephone Encounter (Signed)
Pt needs refill Xanax

## 2016-04-22 NOTE — Telephone Encounter (Signed)
Called in med to pharmacy  

## 2016-04-23 ENCOUNTER — Telehealth: Payer: Self-pay | Admitting: Cardiovascular Disease

## 2016-04-23 ENCOUNTER — Other Ambulatory Visit: Payer: Self-pay

## 2016-04-23 LAB — CBC WITH DIFFERENTIAL/PLATELET
Basophils Absolute: 0.1 x10E3/uL (ref 0.0–0.2)
Basos: 2 %
EOS (ABSOLUTE): 0.1 x10E3/uL (ref 0.0–0.4)
Eos: 5 %
Hematocrit: 37.9 % (ref 37.5–51.0)
Hemoglobin: 11.7 g/dL — ABNORMAL LOW (ref 12.6–17.7)
Immature Grans (Abs): 0 x10E3/uL (ref 0.0–0.1)
Immature Granulocytes: 0 %
Lymphocytes Absolute: 0.7 x10E3/uL (ref 0.7–3.1)
Lymphs: 25 %
MCH: 25.4 pg — ABNORMAL LOW (ref 26.6–33.0)
MCHC: 30.9 g/dL — ABNORMAL LOW (ref 31.5–35.7)
MCV: 82 fL (ref 79–97)
Monocytes Absolute: 0.6 x10E3/uL (ref 0.1–0.9)
Monocytes: 22 %
Neutrophils Absolute: 1.4 x10E3/uL (ref 1.4–7.0)
Neutrophils: 46 %
Platelets: 158 x10E3/uL (ref 150–379)
RBC: 4.6 x10E6/uL (ref 4.14–5.80)
RDW: 16.3 % — ABNORMAL HIGH (ref 12.3–15.4)
WBC: 2.9 x10E3/uL — ABNORMAL LOW (ref 3.4–10.8)

## 2016-04-23 LAB — PLEASE NOTE

## 2016-04-23 NOTE — Telephone Encounter (Signed)
Received FMLA paperwork back from [facility] for Dr Oval Linsey to review and sign.  Forms given to Dr Oval Linsey.

## 2016-04-29 ENCOUNTER — Telehealth: Payer: Self-pay | Admitting: Cardiovascular Disease

## 2016-04-29 NOTE — Telephone Encounter (Signed)
04/23/16 FMLA forms signed by DR Oval Linsey and Faxed to patients Employer per patient's request. lp

## 2016-04-30 ENCOUNTER — Telehealth: Payer: Self-pay | Admitting: Cardiovascular Disease

## 2016-04-30 NOTE — Telephone Encounter (Signed)
Received call from patient.Stated he has been talking to freinds and he wanted to make sure Xarelto is best to take instead of a aspirin or warfarin.Stated he is not having any problems with Xarelto.Advised Xarelto is appropriate.I will send message to [place] for review.

## 2016-04-30 NOTE — Telephone Encounter (Signed)
04-30-16 1213pm pt returned call-pls call  [phone]

## 2016-04-30 NOTE — Telephone Encounter (Signed)
Left message for pt to call.

## 2016-04-30 NOTE — Telephone Encounter (Signed)
New message   Pt call requesting to speak wit RN. Pt had some concerning questions about blood thinner Xarelto. Please call back to discuss

## 2016-05-02 NOTE — Telephone Encounter (Signed)
Yes, Xarelto is best.  Aspirin is not adequate.  Warfarin is harder to manage and bleeding risk is higher.

## 2016-05-03 ENCOUNTER — Other Ambulatory Visit: Payer: Self-pay

## 2016-05-03 ENCOUNTER — Encounter (HOSPITAL_COMMUNITY): Payer: Self-pay | Admitting: Emergency Medicine

## 2016-05-03 ENCOUNTER — Emergency Department (HOSPITAL_COMMUNITY)
Admission: EM | Admit: 2016-05-03 | Discharge: 2016-05-03 | Disposition: A | Discharge status: Home / Self Care | Payer: No Typology Code available for payment source | Attending: Emergency Medicine | Admitting: Emergency Medicine

## 2016-05-03 ENCOUNTER — Emergency Department (HOSPITAL_COMMUNITY): Payer: No Typology Code available for payment source

## 2016-05-03 DIAGNOSIS — M545 Low back pain, unspecified: Secondary | ICD-10-CM

## 2016-05-03 DIAGNOSIS — Z7901 Long term (current) use of anticoagulants: Secondary | ICD-10-CM | POA: Insufficient documentation

## 2016-05-03 DIAGNOSIS — R42 Dizziness and giddiness: Secondary | ICD-10-CM | POA: Insufficient documentation

## 2016-05-03 DIAGNOSIS — F10129 Alcohol abuse with intoxication, unspecified: Secondary | ICD-10-CM | POA: Diagnosis not present

## 2016-05-03 DIAGNOSIS — F17210 Nicotine dependence, cigarettes, uncomplicated: Secondary | ICD-10-CM | POA: Insufficient documentation

## 2016-05-03 DIAGNOSIS — Z8572 Personal history of non-Hodgkin lymphomas: Secondary | ICD-10-CM | POA: Insufficient documentation

## 2016-05-03 DIAGNOSIS — F10920 Alcohol use, unspecified with intoxication, uncomplicated: Secondary | ICD-10-CM

## 2016-05-03 DIAGNOSIS — M549 Dorsalgia, unspecified: Secondary | ICD-10-CM | POA: Diagnosis present

## 2016-05-03 DIAGNOSIS — F419 Anxiety disorder, unspecified: Secondary | ICD-10-CM | POA: Diagnosis not present

## 2016-05-03 LAB — COMPREHENSIVE METABOLIC PANEL
ALT: 39 U/L (ref 17–63)
AST: 62 U/L — ABNORMAL HIGH (ref 15–41)
Albumin: 3.9 g/dL (ref 3.5–5.0)
Alkaline Phosphatase: 35 U/L — ABNORMAL LOW (ref 38–126)
Anion gap: 9 (ref 5–15)
BUN: 14 mg/dL (ref 6–20)
CO2: 28 mmol/L (ref 22–32)
Calcium: 8.9 mg/dL (ref 8.9–10.3)
Chloride: 103 mmol/L (ref 101–111)
Creatinine, Ser: 1 mg/dL (ref 0.61–1.24)
GFR calc Af Amer: >60 mL/min (ref >60)
GFR calc non Af Amer: >60 mL/min (ref >60)
Glucose, Bld: 92 mg/dL (ref 65–99)
Potassium: 3.9 mmol/L (ref 3.5–5.1)
Sodium: 140 mmol/L (ref 135–145)
Total Bilirubin: 0.6 mg/dL (ref 0.3–1.2)
Total Protein: 7 g/dL (ref 6.5–8.1)

## 2016-05-03 LAB — CBC WITH DIFFERENTIAL/PLATELET
Basophils Absolute: 0 10*3/uL (ref 0.0–0.1)
Basophils Relative: 1 %
Eosinophils Absolute: 0.2 10*3/uL (ref 0.0–0.7)
Eosinophils Relative: 6 %
HCT: 38.1 % — ABNORMAL LOW (ref 39.0–52.0)
Hemoglobin: 12.3 g/dL — ABNORMAL LOW (ref 13.0–17.0)
Lymphocytes Relative: 21 %
Lymphs Abs: 0.7 10*3/uL (ref 0.7–4.0)
MCH: 25.3 pg — ABNORMAL LOW (ref 26.0–34.0)
MCHC: 32.3 g/dL (ref 30.0–36.0)
MCV: 78.2 fL (ref 78.0–100.0)
Monocytes Absolute: 0.4 10*3/uL (ref 0.1–1.0)
Monocytes Relative: 13 %
Neutro Abs: 1.8 10*3/uL (ref 1.7–7.7)
Neutrophils Relative %: 59 %
Platelets: 142 10*3/uL — ABNORMAL LOW (ref 150–400)
RBC: 4.87 MIL/uL (ref 4.22–5.81)
RDW: 15.6 % — ABNORMAL HIGH (ref 11.5–15.5)
WBC: 3.1 10*3/uL — ABNORMAL LOW (ref 4.0–10.5)

## 2016-05-03 LAB — URINALYSIS, ROUTINE W REFLEX MICROSCOPIC
Bilirubin Urine: NEGATIVE
Glucose, UA: NEGATIVE mg/dL
Hgb urine dipstick: NEGATIVE
Ketones, ur: NEGATIVE mg/dL
Leukocytes, UA: NEGATIVE
Nitrite: NEGATIVE
Protein, ur: 30 mg/dL — AB
Specific Gravity, Urine: 1.022 (ref 1.005–1.030)
pH: 6.5 (ref 5.0–8.0)

## 2016-05-03 LAB — URINE MICROSCOPIC-ADD ON: RBC / HPF: NONE SEEN RBC/hpf (ref 0–5)

## 2016-05-03 LAB — ETHANOL: Alcohol, Ethyl (B): 127 mg/dL — ABNORMAL HIGH (ref <5)

## 2016-05-03 MED ORDER — OXYCODONE-ACETAMINOPHEN 5-325 MG PO TABS: 2.0000 | ORAL_TABLET | Freq: Once | ORAL | Status: DC

## 2016-05-03 MED ORDER — METOPROLOL SUCCINATE ER 25 MG PO TB24: ORAL_TABLET | ORAL | Status: DC

## 2016-05-03 MED ORDER — DIAZEPAM 5 MG PO TABS
5.0000 mg | ORAL_TABLET | Freq: Once | ORAL | Status: AC
  Administered 2016-05-03: 5 mg via ORAL
  Filled 2016-05-03: qty 1

## 2016-05-03 MED ORDER — LORAZEPAM 2 MG/ML IJ SOLN
1.0000 mg | Freq: Once | INTRAMUSCULAR | Status: AC
  Administered 2016-05-03: 1 mg via INTRAVENOUS
  Filled 2016-05-03: qty 1

## 2016-05-03 MED ORDER — OXYCODONE-ACETAMINOPHEN 5-325 MG PO TABS
1.0000 | ORAL_TABLET | Freq: Once | ORAL | Status: AC
  Administered 2016-05-03: 1 via ORAL
  Filled 2016-05-03: qty 1

## 2016-05-03 MED ORDER — SODIUM CHLORIDE 0.9 % IV BOLUS (SEPSIS)
500.0000 mL | Freq: Once | INTRAVENOUS | Status: AC
  Administered 2016-05-03: 500 mL via INTRAVENOUS

## 2016-05-03 NOTE — ED Notes (Signed)
Pt ambulatory at discharge without any difficulty. Wheeled to waiting room to wait for ride home. A/o x4. Verbalizes understanding of discharge instructions.

## 2016-05-03 NOTE — ED Notes (Signed)
PER [facility]: Patient to ED from home c/o lower back pain. Denies urinary symptoms, was ambulatory on scene - met EMS outside of his house. Upon arrival, patient now reports that he passed out about an hour ago and he has been feeling very weak. Pt did not tell this to EMS, only when this RN entered room to triage patient, so no EKG or CBG had been done. Patient A&O x 4.

## 2016-05-03 NOTE — Telephone Encounter (Signed)
Advised patient and he will get a blood pressure monitor for home use Will call back if any further issues

## 2016-05-03 NOTE — ED Notes (Signed)
MD at bedside. 

## 2016-05-03 NOTE — Telephone Encounter (Signed)
OK to start metoprolol succinate 12.5mg  daily.  If his BP gets low, he is lightheaded or dizzy, will need to stop.

## 2016-05-03 NOTE — ED Provider Notes (Signed)
CSN: [id number]     Arrival date & time 05/03/16  0404 History   First MD Initiated Contact with Patient 05/03/16 [phone]     Chief Complaint  Patient presents with  . Back Pain  . Weakness     (Consider location/radiation/quality/duration/timing/severity/associated sxs/prior Treatment) HPI Comments: Patient with a complicated medical history including lymphoma (completed chemo in 2013), severe mitral valve regurgitation pending surgical repair, PE (off Xarelto for the last year) with new diagnosis of recurrent PE on 04/09/16 on Xarelto now, atrial tachycardia, anxiety who presents via EMS called out for complaint of back pain. The patient reports back pain on waking tonight causing difficulty with getting out of bed. No LE weakness, urinary incontinence. Here the patient reports weakness that is generalized and also present when he woke tonight. He then states he "might have passed out" and woke up on the kitchen floor and states "I didn't remember going to the kitchen". He was able to get up, shower, get dressed and then called for EMS transportation to the emergency department to be evaluated. He denies chest pain, SOB, fever, nausea, vomiting, cough, headache.   Patient is a 55 y.o. male presenting with back pain and weakness. The history is provided by the patient. No language interpreter was used.  Back Pain Associated symptoms: weakness   Associated symptoms: no abdominal pain, no chest pain, no fever and no headaches   Weakness Associated symptoms include weakness. Pertinent negatives include no abdominal pain, chest pain, chills, coughing, fever, headaches, myalgias, nausea, neck pain or vomiting.    Past Medical History  Diagnosis Date  . ED (erectile dysfunction)   . Pulmonary emboli ([place])   . Depression   . Cancer ([place]) 07/2011    STAGE II NON- HODGKIN LYMPHOMA  . Lymphoma, non Hodgkin's 09/25/2011  . [place] I 10/09/2015  . PAC (premature atrial contraction) 10/09/2015  . Atrial  tachycardia ([place]) 01/06/2016  . Murmur 01/06/2016  . Mitral regurgitation 01/20/2016  . Acute pulmonary embolism ([place]) 04/11/2016   Past Surgical History  Procedure Laterality Date  . No past surgeries    . Trigger finger release Bilateral   . Tee without cardioversion N/A 02/11/2016    Procedure: TRANSESOPHAGEAL ECHOCARDIOGRAM (TEE);  Surgeon: [name], MD;  Location: [facility] ENDOSCOPY;  Service: Cardiovascular;  Laterality: N/A;   Family History  Problem Relation Age of Onset  . Cancer Mother     BREAST(BONE)  . Cancer Father     PANCREATIC  . Heart attack Neg Hx   . Hypertension Maternal Grandmother   . Stroke Maternal Aunt    Social History  Substance Use Topics  . Smoking status: Former Smoker -- 1.00 packs/[place] for 32 years    Types: Cigarettes    Quit date: 07/06/2013  . Smokeless tobacco: Current User  . Alcohol Use: 0.0 oz/week    0 Standard drinks or equivalent per week     Comment:  1 bottle of wine    Review of Systems  Constitutional: Negative for fever and chills.  Respiratory: Negative.  Negative for cough and shortness of breath.   Cardiovascular: Negative.  Negative for chest pain.  Gastrointestinal: Negative.  Negative for nausea, vomiting and abdominal pain.  Genitourinary: Negative.   Musculoskeletal: Positive for back pain. Negative for myalgias and neck pain.  Skin: Negative.  Negative for wound.  Neurological: Positive for syncope and weakness. Negative for headaches.      Allergies  Review of patient's allergies indicates no known allergies.  Home  Medications   Prior to Admission medications   Medication Sig Start Date End Date Taking? Authorizing Provider  ALPRAZolam (XANAX) 0.25 MG tablet Take 1 tablet (0.25 mg total) by mouth 2 (two) times daily as needed. for anxiety 04/22/16  Yes Camelia Eng Tysinger, PA-C  Rivaroxaban (XARELTO) 15 MG TABS tablet Take 15 mg by mouth 2 (two) times daily with a meal. FOR 21 DAYS AND THEN START THE XARELTO 20 MG  DAILY   Yes Historical Provider, MD  rivaroxaban (XARELTO) 20 MG TABS tablet Take 1 tablet (20 mg total) by mouth daily with supper. Patient not taking: Reported on 05/03/2016 04/09/16   [name], MD   BP 117/95 mmHg  Pulse 69  Temp(Src) 97.6 F (36.4 C) (Oral)  Resp 17  SpO2 97% Physical Exam  Constitutional: He is oriented to person, place, and time. He appears well-developed and well-nourished. No distress.  HENT:  Head: Normocephalic.  Neck: Normal range of motion. Neck supple.  Cardiovascular: Normal rate and regular rhythm.   Pulmonary/Chest: Effort normal and breath sounds normal. He has no wheezes. He has no rales. He exhibits no tenderness.  Abdominal: Soft. Bowel sounds are normal. There is no tenderness. There is no rebound and no guarding.  Musculoskeletal: Normal range of motion. He exhibits no edema.  Back atraumatic in appearance, no swelling.  Neurological: He is alert and oriented to person, place, and time.  Patient has sporadic tremor to bilateral upper extremities. No tremor in lower extremities. No lateralizing weakness or facial asymmetry. CN's 3-12 grossly intact. Speech clear and focused.   Skin: Skin is warm and dry. No rash noted.  Psychiatric: He has a normal mood and affect.    ED Course  Procedures (including critical care time) Labs Review Labs Reviewed  COMPREHENSIVE METABOLIC PANEL  CBC WITH DIFFERENTIAL/PLATELET  URINALYSIS, ROUTINE W REFLEX MICROSCOPIC (NOT AT [facility])   Results for orders placed or performed during the hospital encounter of 05/03/16  Comprehensive metabolic panel  Result Value Ref Range   Sodium 140 135 - 145 mmol/L   Potassium 3.9 3.5 - 5.1 mmol/L   Chloride 103 101 - 111 mmol/L   CO2 28 22 - 32 mmol/L   Glucose, Bld 92 65 - 99 mg/dL   BUN 14 6 - 20 mg/dL   Creatinine, Ser 1.00 0.61 - 1.24 mg/dL   Calcium 8.9 8.9 - 10.3 mg/dL   Total Protein 7.0 6.5 - 8.1 g/dL   Albumin 3.9 3.5 - 5.0 g/dL   AST 62 (H) 15 - 41 U/L    ALT 39 17 - 63 U/L   Alkaline Phosphatase 35 (L) 38 - 126 U/L   Total Bilirubin 0.6 0.3 - 1.2 mg/dL   GFR calc non Af Amer >60 >60 mL/min   GFR calc Af Amer >60 >60 mL/min   Anion gap 9 5 - 15  CBC with Differential  Result Value Ref Range   WBC 3.1 (L) 4.0 - 10.5 K/uL   RBC 4.87 4.22 - 5.81 MIL/uL   Hemoglobin 12.3 (L) 13.0 - 17.0 g/dL   HCT 38.1 (L) 39.0 - 52.0 %   MCV 78.2 78.0 - 100.0 fL   MCH 25.3 (L) 26.0 - 34.0 pg   MCHC 32.3 30.0 - 36.0 g/dL   RDW 15.6 (H) 11.5 - 15.5 %   Platelets 142 (L) 150 - 400 K/uL   Neutrophils Relative % 59 %   Neutro Abs 1.8 1.7 - 7.7 K/uL   Lymphocytes Relative 21 %  Lymphs Abs 0.7 0.7 - 4.0 K/uL   Monocytes Relative 13 %   Monocytes Absolute 0.4 0.1 - 1.0 K/uL   Eosinophils Relative 6 %   Eosinophils Absolute 0.2 0.0 - 0.7 K/uL   Basophils Relative 1 %   Basophils Absolute 0.0 0.0 - 0.1 K/uL  Urinalysis, Routine w reflex microscopic  Result Value Ref Range   Color, Urine YELLOW YELLOW   APPearance CLEAR CLEAR   Specific Gravity, Urine 1.022 1.005 - 1.030   pH 6.5 5.0 - 8.0   Glucose, UA NEGATIVE NEGATIVE mg/dL   Hgb urine dipstick NEGATIVE NEGATIVE   Bilirubin Urine NEGATIVE NEGATIVE   Ketones, ur NEGATIVE NEGATIVE mg/dL   Protein, ur 30 (A) NEGATIVE mg/dL   Nitrite NEGATIVE NEGATIVE   Leukocytes, UA NEGATIVE NEGATIVE  Ethanol  Result Value Ref Range   Alcohol, Ethyl (B) 127 (H) <5 mg/dL  Urine microscopic-add on  Result Value Ref Range   Squamous Epithelial / LPF 0-5 (A) NONE SEEN   WBC, UA 0-5 0 - 5 WBC/hpf   RBC / HPF NONE SEEN 0 - 5 RBC/hpf   Bacteria, UA RARE (A) NONE SEEN   Casts HYALINE CASTS (A) NEGATIVE   Dg Lumbar Spine Complete  05/03/2016  CLINICAL DATA:  Awakened by low back pain.  No trauma. EXAM: LUMBAR SPINE - COMPLETE 4+ VIEW COMPARISON:  None. FINDINGS: The lumbar vertebrae are normal in height. There is no bone lesion or bony destruction. There is no spondylolisthesis. There is no spondylolysis. Good  preservation of the intervertebral disc spaces. IMPRESSION: Negative. Electronically Signed   By: Andreas Newport M.D.   On: 05/03/2016 05:40   Ct Angio Chest Aorta W/cm &/or Wo/cm  04/09/2016  CLINICAL DATA:  Mitral regurgitation for 2 months, fatigue for one year, former smoker quit 2 years ago, cough, SOB, nausea, upper abdomen pain, no hx of surgery, hx of non hodgkin's lymphoma 07-2011, prev in pacs, EXAM: CT ANGIOGRAPHY CHEST, ABDOMEN, PELVIS WITH CONTRAST TECHNIQUE: Multidetector CT imaging of the chest, abdomen, pelvis was performed using the standard protocol during bolus administration of intravenous contrast. Multiplanar CT image reconstructions and MIPs were obtained to evaluate the vascular anatomy. CONTRAST:  75 ml of Isovue 370 COMPARISON:  01/08/2015 and previous FINDINGS: CHEST Vascular: Left arm IV contrast injection. Innominate vein and SVC patent. Four-chamber cardiac enlargement. Mildly dilated central pulmonary arteries. Fairly good contrast opacification of pulmonary artery branches. There is a partially occlusive central embolus in right lower lobe branch of the pulmonary artery extending into superior segment and basilar segmental branches, this is new and separate from the minimal chronic PE seen on the prior study. Patent pulmonary veins drain into the left atrium. No left atrial thrombus is identified. Adequate contrast opacification of the thoracic aorta with no evidence of dissection, aneurysm, or stenosis. There is classic 3-vessel brachiocephalic arch anatomy without proximal stenosis. Mediastinum/Lymph Nodes: Multiple enlarged lymph nodes including prevascular 12 mm short axis diameter, previously 11 mm ; precarinal 17 mm previously 18; right hilar 22 mm, stable ; AP window 16 mm, previously 11; sub carinal 11 mm, previously 17. Subcentimeter left hilar nodes are stable. No pericardial effusion. Lungs/Pleura: Miliary opacities in right lower lobe and anterior right upper lobe,  slightly more conspicuous than on previous exam. No pleural effusion. No pneumothorax. Intrapulmonary Lymph node image 70/5, previously 4 mm. Musculoskeletal: Mild spondylitic changes at multiple levels in the mid thoracic spine. Sternum intact. ABDOMEN Arterial Aorta: Moderate tortuosity. No aneurysm, dissection, or stenosis.  No significant atheromatous plaque. Celiac axis:          Patent Superior mesenteric:  Patent Left renal:           Duplicated, Co dominant, both widely patent. Right renal:          Duplicated, Co dominant, both widely patent. Inferior mesenteric:  Patent Left iliac: Moderate tortuosity. Minimal scattered calcified plaque in the common iliac. Fusiform dilatation of the distal common iliac 26 mm diameter. Patent internal iliac. Ectatic external iliac 17 mm diameter proximally. Common femoral and proximal SFA patent. Right iliac: Short common iliac. Patent internal iliac. Marked tortuosity of the external iliac external iliac which is ectatic up to 17 mm diameter, without significant atheromatous plaque. No dissection or stenosis. Common femoral and proximal SFA widely patent. Venous Dedicated venous phase imaging not obtained. Patent portal vein, bilateral renal veins, IVC. Bilateral pelvic phleboliths. Review of the MIP images confirms the above findings. Nonvasular Hepatobiliary: Multiple hepatic cysts as previously demonstrated. No definite new lesion. Gallbladder is nondistended. Pancreas: No mass, inflammatory changes, or other significant abnormality. Spleen: Within normal limits in size and appearance. Adrenals/Urinary Tract: Normal adrenal glands. Sub cm probable cyst, upper pole left kidney, stable since 06/30/2012. No hydronephrosis. Urinary bladder incompletely distended. Stomach/Bowel: Stomach, small bowel, colon nondilated. Normal appendix. Lymphatic: No pathologically enlarged lymph nodes. Reproductive: No mass or other significant abnormality. Other: No ascites.  No free air.  Musculoskeletal: Bilateral sacroiliitis. Degenerative disc disease L5-S1. Facet DJD bilaterally L4-5. Mild degenerative changes in bilateral hips. IMPRESSION: 1. Right lower lobe acute pulmonary embolus, new since previous exam. 2. Progression of some mediastinal adenopathy and miliary opacities in the right lung since previous study, suggesting residual/recurrent disease given history of lymphoma. These results will be called to the ordering clinician or representative by the Radiologist Assistant, and communication documented in the PACS or zVision Dashboard. 3. Tortuous aortoiliac arterial system without dissection or stenosis. 4. 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 5. 17 mm ectatic external iliac arteries bilaterally. Electronically Signed   By: Lucrezia Europe M.D.   On: 04/09/2016 10:16   Ct Angio Abd/pel W/ And/or W/o  04/09/2016  CLINICAL DATA:  Mitral regurgitation for 2 months, fatigue for one year, former smoker quit 2 years ago, cough, SOB, nausea, upper abdomen pain, no hx of surgery, hx of non hodgkin's lymphoma 07-2011, prev in pacs, EXAM: CT ANGIOGRAPHY CHEST, ABDOMEN, PELVIS WITH CONTRAST TECHNIQUE: Multidetector CT imaging of the chest, abdomen, pelvis was performed using the standard protocol during bolus administration of intravenous contrast. Multiplanar CT image reconstructions and MIPs were obtained to evaluate the vascular anatomy. CONTRAST:  75 ml of Isovue 370 COMPARISON:  01/08/2015 and previous FINDINGS: CHEST Vascular: Left arm IV contrast injection. Innominate vein and SVC patent. Four-chamber cardiac enlargement. Mildly dilated central pulmonary arteries. Fairly good contrast opacification of pulmonary artery branches. There is a partially occlusive central embolus in right lower lobe branch of the pulmonary artery extending into superior segment and basilar segmental branches, this is new and separate from the minimal chronic PE seen on the prior study. Patent  pulmonary veins drain into the left atrium. No left atrial thrombus is identified. Adequate contrast opacification of the thoracic aorta with no evidence of dissection, aneurysm, or stenosis. There is classic 3-vessel brachiocephalic arch anatomy without proximal stenosis. Mediastinum/Lymph Nodes: Multiple enlarged lymph nodes including prevascular 12 mm short axis diameter, previously 11 mm ; precarinal 17 mm previously 18; right hilar 22 mm,  stable ; AP window 16 mm, previously 11; sub carinal 11 mm, previously 17. Subcentimeter left hilar nodes are stable. No pericardial effusion. Lungs/Pleura: Miliary opacities in right lower lobe and anterior right upper lobe, slightly more conspicuous than on previous exam. No pleural effusion. No pneumothorax. Intrapulmonary Lymph node image 70/5, previously 4 mm. Musculoskeletal: Mild spondylitic changes at multiple levels in the mid thoracic spine. Sternum intact. ABDOMEN Arterial Aorta: Moderate tortuosity. No aneurysm, dissection, or stenosis. No significant atheromatous plaque. Celiac axis:          Patent Superior mesenteric:  Patent Left renal:           Duplicated, Co dominant, both widely patent. Right renal:          Duplicated, Co dominant, both widely patent. Inferior mesenteric:  Patent Left iliac: Moderate tortuosity. Minimal scattered calcified plaque in the common iliac. Fusiform dilatation of the distal common iliac 26 mm diameter. Patent internal iliac. Ectatic external iliac 17 mm diameter proximally. Common femoral and proximal SFA patent. Right iliac: Short common iliac. Patent internal iliac. Marked tortuosity of the external iliac external iliac which is ectatic up to 17 mm diameter, without significant atheromatous plaque. No dissection or stenosis. Common femoral and proximal SFA widely patent. Venous Dedicated venous phase imaging not obtained. Patent portal vein, bilateral renal veins, IVC. Bilateral pelvic phleboliths. Review of the MIP images  confirms the above findings. Nonvasular Hepatobiliary: Multiple hepatic cysts as previously demonstrated. No definite new lesion. Gallbladder is nondistended. Pancreas: No mass, inflammatory changes, or other significant abnormality. Spleen: Within normal limits in size and appearance. Adrenals/Urinary Tract: Normal adrenal glands. Sub cm probable cyst, upper pole left kidney, stable since 06/30/2012. No hydronephrosis. Urinary bladder incompletely distended. Stomach/Bowel: Stomach, small bowel, colon nondilated. Normal appendix. Lymphatic: No pathologically enlarged lymph nodes. Reproductive: No mass or other significant abnormality. Other: No ascites.  No free air. Musculoskeletal: Bilateral sacroiliitis. Degenerative disc disease L5-S1. Facet DJD bilaterally L4-5. Mild degenerative changes in bilateral hips. IMPRESSION: 1. Right lower lobe acute pulmonary embolus, new since previous exam. 2. Progression of some mediastinal adenopathy and miliary opacities in the right lung since previous study, suggesting residual/recurrent disease given history of lymphoma. These results will be called to the ordering clinician or representative by the Radiologist Assistant, and communication documented in the PACS or zVision Dashboard. 3. Tortuous aortoiliac arterial system without dissection or stenosis. 4. 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 5. 17 mm ectatic external iliac arteries bilaterally. Electronically Signed   By: Lucrezia Europe M.D.   On: 04/09/2016 10:16    Imaging Review No results found. I have personally reviewed and evaluated these images and lab results as part of my medical decision-making.   EKG Interpretation None      MDM   Final diagnoses:  None    1. Back pain 2. Anxiety  The patient presents with multiple complaints, initially to EMS, back pain. Unclear history of back pain. He then states he believes he had a syncopal episode, waking up on the kitchen floor.  Doesn't remember having walked to kitchen. He reports a tremor in UE's "I think from anxiety".   Patient has labs pending and will need reassessment. Patient care signed out to Jola Schmidt, MD, for recheck and disposition.     Charlann Lange, PA-C 05/03/16 [place], MD 05/04/16 [phone]

## 2016-05-03 NOTE — ED Notes (Signed)
Tremors noted to patient extremities with arms extended. Pt reports daily ETOH consumption (a couple of glasses of wine) for "many years" but denies consuming yesterday.

## 2016-05-03 NOTE — ED Provider Notes (Signed)
Medical screening examination/treatment/procedure(s) were conducted as a shared visit with non-physician practitioner(s) and myself.  I personally evaluated the patient during the encounter.   EKG Interpretation   Date/Time:  Monday May 03 2016 05:40:20 EDT Ventricular Rate:  57 PR Interval:    QRS Duration: 174 QT Interval:  456 QTC Calculation: 444 R Axis:   -40 Text Interpretation:  Atrial fibrillation Nonspecific IVCD with LAD Left  ventricular hypertrophy Anterior Q waves, possibly due to LVH No  significant change was found Confirmed by Kyrese Gartman  MD, Lennette Bihari ([id number]) on  05/03/2016 6:43:38 AM Also confirmed by Venora Maples  MD, Lennette Bihari ([id number]), editor  [name], CCT, [place] ([id number])  on 05/03/2016 7:05:27 AM       Patient admits to ongoing alcohol abuse.  He doesn't drinking wine last night.  He has had no recent withdrawal of his alcohol consumption.  His back pain seems more musculoskeletal as he is tender in the left paraspinal muscles.  No weakness of his arms or legs.  EKG is A. fib.  Jola Schmidt, MD 05/03/16 0800

## 2016-05-03 NOTE — Telephone Encounter (Signed)
Spoke with patient and reviewed Dr Blenda Mounts recommendations  He was asking if he could restart the Metoprolol Feels like his heart racing several times a [place]. When he took Metoprolol felt his heart rate was under better control  He does not check his blood pressure or heart rate at home  Will forward to Dr Oval Linsey for review

## 2016-05-03 NOTE — ED Notes (Signed)
Ambulated pt in hallway, tolerated well.

## 2016-05-13 ENCOUNTER — Telehealth: Payer: Self-pay

## 2016-05-13 NOTE — Telephone Encounter (Signed)
Message rcvd to cancel appt on 05/14/2016 with JCL.  LM on pt VCM that appt has been cancelled and to please call the office back to r/s. Victorino December

## 2016-05-14 ENCOUNTER — Ambulatory Visit: Payer: PRIVATE HEALTH INSURANCE | Admitting: Family Medicine

## 2016-06-14 ENCOUNTER — Encounter (HOSPITAL_COMMUNITY): Payer: Self-pay

## 2016-06-14 ENCOUNTER — Emergency Department (HOSPITAL_COMMUNITY)
Admission: EM | Admit: 2016-06-14 | Discharge: 2016-06-14 | Disposition: A | Discharge status: Home / Self Care | Payer: No Typology Code available for payment source | Attending: Emergency Medicine | Admitting: Emergency Medicine

## 2016-06-14 DIAGNOSIS — Z7901 Long term (current) use of anticoagulants: Secondary | ICD-10-CM | POA: Diagnosis not present

## 2016-06-14 DIAGNOSIS — Z87891 Personal history of nicotine dependence: Secondary | ICD-10-CM | POA: Insufficient documentation

## 2016-06-14 DIAGNOSIS — R42 Dizziness and giddiness: Secondary | ICD-10-CM

## 2016-06-14 DIAGNOSIS — Z8572 Personal history of non-Hodgkin lymphomas: Secondary | ICD-10-CM | POA: Diagnosis not present

## 2016-06-14 DIAGNOSIS — R319 Hematuria, unspecified: Secondary | ICD-10-CM | POA: Diagnosis present

## 2016-06-14 LAB — CBC WITH DIFFERENTIAL/PLATELET
Basophils Absolute: 0.1 10*3/uL (ref 0.0–0.1)
Basophils Relative: 2 %
Eosinophils Absolute: 0.1 10*3/uL (ref 0.0–0.7)
Eosinophils Relative: 2 %
HCT: 39 % (ref 39.0–52.0)
Hemoglobin: 12.6 g/dL — ABNORMAL LOW (ref 13.0–17.0)
Lymphocytes Relative: 17 %
Lymphs Abs: 0.6 10*3/uL — ABNORMAL LOW (ref 0.7–4.0)
MCH: 25.9 pg — ABNORMAL LOW (ref 26.0–34.0)
MCHC: 32.3 g/dL (ref 30.0–36.0)
MCV: 80.1 fL (ref 78.0–100.0)
Monocytes Absolute: 0.3 10*3/uL (ref 0.1–1.0)
Monocytes Relative: 10 %
Neutro Abs: 2.2 10*3/uL (ref 1.7–7.7)
Neutrophils Relative %: 69 %
Platelets: 148 10*3/uL — ABNORMAL LOW (ref 150–400)
RBC: 4.87 MIL/uL (ref 4.22–5.81)
RDW: 15.7 % — ABNORMAL HIGH (ref 11.5–15.5)
WBC: 3.2 10*3/uL — ABNORMAL LOW (ref 4.0–10.5)

## 2016-06-14 LAB — COMPREHENSIVE METABOLIC PANEL
ALT: 45 U/L (ref 17–63)
AST: 64 U/L — ABNORMAL HIGH (ref 15–41)
Albumin: 4 g/dL (ref 3.5–5.0)
Alkaline Phosphatase: 39 U/L (ref 38–126)
Anion gap: 12 (ref 5–15)
BUN: 12 mg/dL (ref 6–20)
CO2: 28 mmol/L (ref 22–32)
Calcium: 9.2 mg/dL (ref 8.9–10.3)
Chloride: 100 mmol/L — ABNORMAL LOW (ref 101–111)
Creatinine, Ser: 0.95 mg/dL (ref 0.61–1.24)
GFR calc Af Amer: >60 mL/min (ref >60)
GFR calc non Af Amer: >60 mL/min (ref >60)
Glucose, Bld: 95 mg/dL (ref 65–99)
Potassium: 4.2 mmol/L (ref 3.5–5.1)
Sodium: 140 mmol/L (ref 135–145)
Total Bilirubin: 0.5 mg/dL (ref 0.3–1.2)
Total Protein: 6.8 g/dL (ref 6.5–8.1)

## 2016-06-14 LAB — URINALYSIS, ROUTINE W REFLEX MICROSCOPIC
Bilirubin Urine: NEGATIVE
Glucose, UA: NEGATIVE mg/dL
Hgb urine dipstick: NEGATIVE
Ketones, ur: NEGATIVE mg/dL
Nitrite: NEGATIVE
Protein, ur: 100 mg/dL — AB
Specific Gravity, Urine: 1.026 (ref 1.005–1.030)
pH: 7 (ref 5.0–8.0)

## 2016-06-14 LAB — URINE MICROSCOPIC-ADD ON

## 2016-06-14 LAB — SAMPLE TO BLOOD BANK

## 2016-06-14 LAB — POC OCCULT BLOOD, ED: Fecal Occult Bld: NEGATIVE

## 2016-06-14 MED ORDER — DICYCLOMINE HCL 10 MG PO CAPS
10.0000 mg | ORAL_CAPSULE | Freq: Once | ORAL | Status: AC
  Administered 2016-06-14: 10 mg via ORAL
  Filled 2016-06-14: qty 1

## 2016-06-14 MED ORDER — LORAZEPAM 2 MG/ML IJ SOLN
1.0000 mg | Freq: Once | INTRAMUSCULAR | Status: AC
  Administered 2016-06-14: 1 mg via INTRAVENOUS
  Filled 2016-06-14: qty 1

## 2016-06-14 MED ORDER — SODIUM CHLORIDE 0.9 % IV SOLN
8.0000 mg/h | INTRAVENOUS | Status: DC
  Filled 2016-06-14 (×2): qty 80

## 2016-06-14 MED ORDER — SODIUM CHLORIDE 0.9 % IV SOLN
80.0000 mg | Freq: Once | INTRAVENOUS | Status: DC
  Filled 2016-06-14: qty 80

## 2016-06-14 NOTE — ED Notes (Signed)
Patient verbalized understanding of discharge instructions and denies any further needs or questions at this time. VS stable. Patient ambulatory with steady gait.  

## 2016-06-14 NOTE — ED Notes (Signed)
Helyn Numbers, PA for rectal examination and collection of stool on patient; sample was sent to mini lab for testing; patient tolerated well

## 2016-06-14 NOTE — ED Triage Notes (Addendum)
Pt presents to the ed with complaints of blood in his urine that started this morning, patient takes xarelto, pt denies pain, vs wnl, alert oriented ambulatory, patient also reports dark stools and feeling dizzy

## 2016-06-14 NOTE — ED Notes (Signed)
Patient undressed, in gown, on continuous pulse oximetry and blood pressure cuff 

## 2016-06-14 NOTE — ED Provider Notes (Signed)
[facility] DEPT Provider Note   CSN: [id number] Arrival date & time: 06/14/16  1304  First Provider Contact:  First MD Initiated Contact with Patient 06/14/16 1306        History   Chief Complaint Chief Complaint  Patient presents with  . Hematuria    HPI  Blood pressure 128/100, pulse 81, temperature 98.9 F (37.2 C), temperature source Oral, resp. rate 18, SpO2 96 %.  Leonard Bentley is a 55 y.o. male with past medical history significant for non-Hodgkin's lymphoma, pulmonary embolism complaining of hematuria which she noticed this morning. No dysuria but he does endorse urinary frequency. Patient is anticoagulated with Eliquis for pulmonary embolism. No fever, chills, flank pain, shortness of breath, nausea or vomiting. Patient reports darker than normal, normally formed stool without diarrhea onset 2 days ago. He also has a cramping abdominal pain which she's had in the past he says that there is a piece of bowel that can sometimes get stuck in his abdomen. He denies fever, chills, vomiting. She states that he drinks approximately every Kreisler. His never had any seizures or DTs from alcohol withdrawals. He does not take any NSAIDs including aspirin. Does not have a gastroenterologist. Safety doesn't have any chest pain or shortness of breath but he feels lightheaded. Has not syncopized.  HPI  Past Medical History:  Diagnosis Date  . Acute pulmonary embolism ([place]) 04/11/2016  . Atrial tachycardia ([place]) 01/06/2016  . Cancer ([place]) 07/2011   STAGE II NON- HODGKIN LYMPHOMA  . Depression   . ED (erectile dysfunction)   . Lymphoma, non Hodgkin's 09/25/2011  . Mitral regurgitation 01/20/2016  . Mobitz I 10/09/2015  . Murmur 01/06/2016  . PAC (premature atrial contraction) 10/09/2015  . Pulmonary emboli [facility])     Patient Active Problem List   Diagnosis Date Noted  . Acute pulmonary embolism ([place]) 04/11/2016  . Left sided numbness   . Anxiety disorder 04/02/2016  . Panic attacks  04/02/2016  . Mitral valve prolapse   . Mitral regurgitation 01/20/2016  . Atrial tachycardia ([place]) 01/06/2016  . Murmur 01/06/2016  . Mobitz I 10/09/2015  . PAC (premature atrial contraction) 10/09/2015  . Arthritis 08/14/2015  . Neuropathy due to chemotherapeutic drug ([place]) 03/09/2013  . Leucopenia 01/31/2013  . Personal history of pulmonary embolism 12/28/2012  . Cigarette smoker 08/24/2012  . Hemorrhoid 03/08/2012  . Anemia 12/10/2011  . History of non-Hodgkin's lymphoma 09/25/2011    Past Surgical History:  Procedure Laterality Date  . NO PAST SURGERIES    . TEE WITHOUT CARDIOVERSION N/A 02/11/2016   Procedure: TRANSESOPHAGEAL ECHOCARDIOGRAM (TEE);  Surgeon: [name], MD;  Location: [facility] ENDOSCOPY;  Service: Cardiovascular;  Laterality: N/A;  . TRIGGER FINGER RELEASE Bilateral       Home Medications    Prior to Admission medications   Medication Sig Start Date End Date Taking? Authorizing Provider  ALPRAZolam (XANAX) 0.25 MG tablet Take 1 tablet (0.25 mg total) by mouth 2 (two) times daily as needed. for anxiety 04/22/16   Camelia Eng Tysinger, PA-C  metoprolol succinate (TOPROL XL) 25 MG 24 hr tablet 1/2 tablet by mouth daily 05/03/16   [name], MD  Rivaroxaban (XARELTO) 15 MG TABS tablet Take 15 mg by mouth 2 (two) times daily with a meal. FOR 21 DAYS AND THEN START THE XARELTO 20 MG DAILY    Historical Provider, MD  rivaroxaban (XARELTO) 20 MG TABS tablet Take 1 tablet (20 mg total) by mouth daily with supper. Patient not taking: Reported  on 05/03/2016 04/09/16   [name], MD    Family History Family History  Problem Relation Age of Onset  . Cancer Mother     BREAST(BONE)  . Cancer Father     PANCREATIC  . Hypertension Maternal Grandmother   . Stroke Maternal Aunt   . Heart attack Neg Hx     Social History Social History  Substance Use Topics  . Smoking status: Former Smoker    [place]: 1.00    Years: 32.00    Types: Cigarettes    Quit  date: 07/06/2013  . Smokeless tobacco: Current User  . Alcohol use 0.0 oz/week     Comment:  1 bottle of wine     Allergies   Review of patient's allergies indicates no known allergies.   Review of Systems Review of Systems  10 systems reviewed and found to be negative, except as noted in the HPI.   Physical Exam Updated Vital Signs BP 128/100 (BP Location: Right Arm)   Pulse 81   Temp 98.9 F (37.2 C) (Oral)   Resp 18   SpO2 96%   Physical Exam  Constitutional: He is oriented to person, place, and time. He appears well-developed and well-nourished. No distress.  HENT:  Head: Normocephalic and atraumatic.  Mouth/Throat: Oropharynx is clear and moist.  Eyes: Conjunctivae and EOM are normal. Pupils are equal, round, and reactive to light.  Neck: Normal range of motion.  Cardiovascular: Normal rate, regular rhythm and intact distal pulses.   Pulmonary/Chest: Effort normal and breath sounds normal.  Abdominal: Soft. There is no tenderness.  Genitourinary: Rectal exam shows guaiac negative stool.  Genitourinary Comments: Digital rectal exam chaperoned by technician: No rashes or lesions, normally formed dark stool  Musculoskeletal: Normal range of motion.  Neurological: He is alert and oriented to person, place, and time.  Skin: He is not diaphoretic.  Psychiatric: He has a normal mood and affect.  Nursing note and vitals reviewed.    ED Treatments / Results  Labs (all labs ordered are listed, but only abnormal results are displayed) Labs Reviewed - No data to display  EKG  EKG Interpretation None       Radiology No results found.  Procedures Procedures (including critical care time)  Medications Ordered in ED Medications - No data to display   Initial Impression / Assessment and Plan / ED Course  I have reviewed the triage vital signs and the nursing notes.  Pertinent labs & imaging results that were available during my care of the patient were  reviewed by me and considered in my medical decision making (see chart for details).  Clinical Course    Vitals:   06/14/16 1305  BP: 128/100  Pulse: 81  Resp: 18  Temp: 98.9 F (37.2 C)  TempSrc: Oral  SpO2: 96%     Leonard Bentley is 55 y.o. male presenting with hematuria and urinary frequency, he also reports dark stools onset 2 days ago normally formed, digital rectal exam is performed and patient does have a dark stool however the guaiac is negative. Blood work reassuring with no anemia or significant electrolyte abnormality, urinalysis is without hematuria. Patient states that he still feels light headed, denies chest pain or shortness of breath, will check EKG.  EKG unchanged.  Evaluation does not show pathology that would require ongoing emergent intervention or inpatient treatment. Pt is hemodynamically stable and mentating appropriately. Discussed findings and plan with patient/guardian, who agrees with care plan. All questions answered. Return  precautions discussed and outpatient follow up given.    Final Clinical Impressions(s) / ED Diagnoses   Final diagnoses:  Light headedness      Monico Blitz, PA-C 06/14/16 [place], MD 06/14/16 [phone]

## 2016-06-14 NOTE — Discharge Instructions (Signed)
Please follow with your primary care doctor in the next 2 days for a check-up. They must obtain records for further management.  ° °Do not hesitate to return to the Emergency Department for any new, worsening or concerning symptoms.  ° °

## 2016-06-17 ENCOUNTER — Other Ambulatory Visit: Payer: Self-pay | Admitting: Medical

## 2016-06-17 NOTE — Telephone Encounter (Signed)
Okay to refill? 

## 2016-06-17 NOTE — Telephone Encounter (Signed)
Dr.Lalonde is this okay to refill 

## 2016-07-13 ENCOUNTER — Other Ambulatory Visit: Payer: No Typology Code available for payment source

## 2016-07-14 ENCOUNTER — Telehealth: Payer: Self-pay | Admitting: *Deleted

## 2016-07-14 NOTE — Telephone Encounter (Signed)
Returning call,would like to have appointment next week after Tuesday. He is scheduled to have a scan next Tuesday.

## 2016-07-14 NOTE — Telephone Encounter (Signed)
Dr Oval Linsey requested patients follow up appointment be moved from Friday morning to Thursday morning secondary to scheduling conflict.  Left message to call back to see if he could come tomorrow at 9:15

## 2016-07-14 NOTE — Telephone Encounter (Signed)
Spoke with patient and rescheduled his appointment until after his follow up CT scan

## 2016-07-16 ENCOUNTER — Ambulatory Visit: Payer: No Typology Code available for payment source | Admitting: Cardiovascular Disease

## 2016-07-20 ENCOUNTER — Ambulatory Visit
Admission: RE | Admit: 2016-07-20 | Discharge: 2016-07-20 | Disposition: A | Discharge status: Home / Self Care | Payer: No Typology Code available for payment source | Source: Ambulatory Visit | Attending: Physician Assistant | Admitting: Physician Assistant

## 2016-07-20 ENCOUNTER — Telehealth: Payer: Self-pay | Admitting: *Deleted

## 2016-07-20 DIAGNOSIS — I2699 Other pulmonary embolism without acute cor pulmonale: Secondary | ICD-10-CM

## 2016-07-20 MED ORDER — IOPAMIDOL (ISOVUE-370) INJECTION 76%
100.0000 mL | Freq: Once | INTRAVENOUS | Status: AC | PRN
  Administered 2016-07-20: 100 mL via INTRAVENOUS

## 2016-07-20 NOTE — Telephone Encounter (Signed)
Received call from CT department at [facility] with call report  IMPRESSION: 1. The burden of pulmonary embolus in the right lower lobe has significantly diminished with only minimal linear thrombus remaining. The findings are consistent with chronic thrombus with no acute or new thrombus identified. 2. Persistent adenopathy in the mediastinum and hila correlates with the patient's history of non-Hodgkin's lymphoma. There has been no definitive interval change. 3. Persistent miliary pattern of nodularity in the lungs as well as numerous tiny nodules abutting the pleural surfaces. These findings are nonspecific. Sequela of malignancy including lymphoma could result in these findings. However, a variety of atypical infectious and inflammatory causes could present with a miliary pattern as well. Again, these findings are unchanged since June of 2017. Recommend clinical correlation and follow-up as clinically warranted. These results will be called to the ordering clinician or representative by the Radiologist Assistant, and communication documented in the PACS or zVision Dashboard   Will forward to Dr Oval Linsey for review

## 2016-07-27 ENCOUNTER — Encounter: Payer: Self-pay | Admitting: Cardiovascular Disease

## 2016-07-27 ENCOUNTER — Ambulatory Visit (INDEPENDENT_AMBULATORY_CARE_PROVIDER_SITE_OTHER): Payer: No Typology Code available for payment source | Admitting: Cardiovascular Disease

## 2016-07-27 VITALS — BP 124/80 | HR 64 | Ht 69.0 in | Wt 192.0 lb

## 2016-07-27 DIAGNOSIS — Z79899 Other long term (current) drug therapy: Secondary | ICD-10-CM

## 2016-07-27 DIAGNOSIS — D689 Coagulation defect, unspecified: Secondary | ICD-10-CM | POA: Diagnosis not present

## 2016-07-27 DIAGNOSIS — I491 Atrial premature depolarization: Secondary | ICD-10-CM

## 2016-07-27 DIAGNOSIS — I340 Nonrheumatic mitral (valve) insufficiency: Secondary | ICD-10-CM | POA: Diagnosis not present

## 2016-07-27 DIAGNOSIS — Z01818 Encounter for other preprocedural examination: Secondary | ICD-10-CM

## 2016-07-27 NOTE — Patient Instructions (Addendum)
Medication Instructions:  Hold Xarelto 3 days before Heart Cath and Take an 81 mg aspirin in place of Xarelto  Labwork: Pre Op Labs  Testing/Procedures: Your physician has requested that you have a Left and Right Cardiac Catheterization. Cardiac catheterization is used to diagnose and/or treat various heart conditions. Doctors may recommend this procedure for a number of different reasons. The most common reason is to evaluate chest pain. Chest pain can be a symptom of coronary artery disease (CAD), and cardiac catheterization can show whether plaque is narrowing or blocking your heart's arteries. This procedure is also used to evaluate the valves, as well as measure the blood flow and oxygen levels in different parts of your heart. For further information please visit [email or web address]. Please follow instruction sheet, as given.   Follow-Up: Your physician recommends that you schedule a follow-up appointment in: 3 Months   Any Other Special Instructions Will Be Listed Below (If Applicable).   If you need a refill on your cardiac medications before your next appointment, please call your pharmacy.

## 2016-07-27 NOTE — Progress Notes (Signed)
Cardiology Office Note   Date:  07/28/2016   ID:  Leonard Bentley, DOB 06-01-1961, MRN [id number]  PCP:  Wyatt Haste, MD  Cardiologist:   [name], MD   No chief complaint on file.     Patient ID: Leonard Bentley is a 55 y.o. male with prior tobacco abuse, prior PE, and non-Hodgkin's lymphoma s/p chemotherapy who presents for follow up.   History of Present Illness:  Leonard Bentley was initially evaluated 10/2015 after his PCP, [name], noted that he had Mobitz I.  He was referred to cardiology, where he reported that he wasn't able to run as long as he used to.  He was referred for exercise Cardiolite that revealed LVEF 52% and no ischemia. On 01/20/16 he had an echo that was revealed LVEF 60-65% with moderate LVH and posterior mitral valve leaflet prolapse. There was moderate to severe mitral regurgitation.  He was referred for transesophageal echocardiography which confirmed moderate to severe mitral regurgitation. The left atrium was massively dilated but there was no left ventricle dilation.  Pulmonary pressures were normal.   He was referred to Dr. [name] for consideration of mitral valve replacement/repair.  He had a chest CT on 04/09/16 for preoperative assessment and was noted to have a pulmonary embolism in the right lower lobe.  Leonard Bentley had a PE several years ago in the setting of testosterone therapy.  He was started on Xarelto anticoagulation indefinitely.  He had a follow up CT 07/20/16 that showed no acute thrombus and resolution of most of the prior PE.  There was a minimal, chronic linear thrombus in the RLL.  Since his last appointment Leonard Bentley has been doing well.  He went to the ED 06/2016 with concern for hematuria and melena, but his blood counts were stable, his stool was guaiac negative and there was not blood on urinalysis.  He continues to exercise regularly.  However, he is only able to run 1.5 miles due to fatigue and shortness of breath.  He also likes to do  pushups and does 150 daily.  He feels week and notes shortness of breath with exertion.  He denies lower extremity edema, orthopnea or PND.  He restarted metoprolol and notes that his palpitations are better.  He only takes it as needed.   Past Medical History:  Diagnosis Date  . Acute pulmonary embolism ([place]) 04/11/2016  . Atrial tachycardia ([place]) 01/06/2016  . Cancer ([place]) 07/2011   STAGE II NON- HODGKIN LYMPHOMA  . Depression   . ED (erectile dysfunction)   . Lymphoma, non Hodgkin's 09/25/2011  . Mitral regurgitation 01/20/2016  . Mobitz I 10/09/2015  . Murmur 01/06/2016  . PAC (premature atrial contraction) 10/09/2015  . Pulmonary emboli [facility])     Past Surgical History:  Procedure Laterality Date  . NO PAST SURGERIES    . TEE WITHOUT CARDIOVERSION N/A 02/11/2016   Procedure: TRANSESOPHAGEAL ECHOCARDIOGRAM (TEE);  Surgeon: [name], MD;  Location: [facility] ENDOSCOPY;  Service: Cardiovascular;  Laterality: N/A;  . TRIGGER FINGER RELEASE Bilateral      Current Outpatient Prescriptions  Medication Sig Dispense Refill  . metoprolol succinate (TOPROL XL) 25 MG 24 hr tablet 1/2 tablet by mouth daily 15 tablet 3  . rivaroxaban (XARELTO) 20 MG TABS tablet Take 1 tablet (20 mg total) by mouth daily with supper. 30 tablet 5   No current facility-administered medications for this visit.     Allergies:   Review of patient's allergies indicates  no known allergies.    Social History:  The patient  reports that he quit smoking about 3 years ago. His smoking use included Cigarettes. He has a 32.00 pack-year smoking history. He uses smokeless tobacco. He reports that he drinks alcohol. He reports that he does not use drugs.   Family History:  The patient's family history includes Cancer in his father and mother; Hypertension in his maternal grandmother; Stroke in his maternal aunt.    ROS:  Please see the history of present illness.   Otherwise, review of systems are positive for none.   All  other systems are reviewed and negative.    PHYSICAL EXAM: VS:  BP 124/80   Pulse 64   Ht 5\' 9"  (1.753 m)   Wt 192 lb (87.1 kg)   BMI 28.35 kg/m  , BMI Body mass index is 28.35 kg/m. GENERAL:  Well appearing HEENT:  Pupils equal round and reactive, fundi not visualized, oral mucosa unremarkable NECK:  No jugular venous distention, waveform within normal limits, carotid upstroke brisk and symmetric, no bruits LYMPHATICS:  No cervical adenopathy LUNGS:  Clear to auscultation bilaterally HEART:  RRR.  PMI not displaced or sustained,S1 and S2 within normal limits, + S3, no S4, no clicks, no rubs, II/VI holosystolic murmur at the apex ABD:  Flat, positive bowel sounds normal in frequency in pitch, no bruits, no rebound, no guarding, no midline pulsatile mass, no hepatomegaly, no splenomegaly EXT:  2 plus pulses throughout, no edema, no cyanosis no clubbing SKIN:  No rashes no nodules NEURO:  Cranial nerves II through XII grossly intact, motor grossly intact throughout PSYCH:  Cognitively intact, oriented to person place and time   EKG:  EKG is ordered today. 04/09/16: Sinus arrhythmia. Rate 92 bpm. First degree AV block. 09/15/15: sinus rhythm rate 87 bpm. PACs. Second degree heart block, Mobitz I.  LAFB  Exercise Cardiolite 10/15/15:  Nuclear stress EF: 52%.  There was no ST segment deviation noted during stress.  No T wave inversion was noted during stress.  The study is normal.  This is a low risk study.  Low risk stress nuclear study with normal perfusion and normal left ventricular regional systolic function. Borderline global LVEF 52%.  Echo 01/20/16: Study Conclusions  - Left ventricle: The cavity size was normal. Wall thickness was  increased in a pattern of moderate LVH. Systolic function was  normal. The estimated ejection fraction was in the range of 60%  to 65%. Wall motion was normal; there were no regional wall  motion abnormalities. The study is not  technically sufficient to  allow evaluation of LV diastolic function. - Aortic valve: Sclerosis without stenosis. There was trivial  regurgitation. - Mitral valve: Mildly thickened leaflets with predominate prolapse  of the posterior leaflet and anteriorly directed mitral  regurgitation. There was moderate to severe eccentric  regurgitation directed at the atrial septum. - Left atrium: Massively dilated at 85 ml/m2. - Tricuspid valve: There was mild regurgitation. - Pulmonary arteries: PA peak pressure: 27 mm Hg (S). - Inferior vena cava: The vessel was normal in size. The  respirophasic diameter changes were in the normal range (>= 50%),  consistent with normal central venous pressure.  Impressions:  - LVEF 60-65%, moderate LVH, trivial AI, moderate to more likely  severe eccentric MR directed anteriorly and to the interatrial  septum, posterior mitral valve leaflet prolpase, massive LAE,  mild TR, RVSP 27 mmHg, normal IVC.  TEE 02/11/16: - Left ventricle: Systolic function was  normal. The estimated  ejection fraction was in the range of 60% to 65%. Wall motion was  normal; there were no regional wall motion abnormalities. - Aortic valve: There was no regurgitation. - Mitral valve: Mild prolapse, involving the middle scallop of the  posterior leaflet. There was moderate to severe regurgitation  directed eccentrically and anteriorly. Valve area by continuity  equation (using LVOT flow): 2.55 cm^2. Effective regurgitant  orifice (PISA): 0.33 cm^2. Regurgitant volume (PISA): 48 ml. - Left atrium: No evidence of thrombus in the atrial cavity or  appendage. No evidence of thrombus in the atrial cavity or  appendage. - Right atrium: No evidence of thrombus in the atrial cavity or  appendage. - Atrial septum: No defect or patent foramen ovale was identified  on saline microcavitation study. - Tricuspid valve: There was no significant regurgitation.   Recent  Labs: 10/10/2015: TSH 0.687 06/14/2016: ALT 45; BUN 12; Creatinine, Ser 0.95; Hemoglobin 12.6; Platelets 148; Potassium 4.2; Sodium 140    Lipid Panel    Component Value Date/Time   CHOL 239 (H) 04/03/2016 0323   CHOL 238 (H) 08/18/2015 0825   TRIG 63 04/03/2016 0323   HDL 126 04/03/2016 0323   HDL 124 08/18/2015 0825   CHOLHDL 1.9 04/03/2016 0323   VLDL 13 04/03/2016 0323   LDLCALC 100 (H) 04/03/2016 0323   LDLCALC 94 08/18/2015 0825      Wt Readings from Last 3 Encounters:  07/27/16 192 lb (87.1 kg)  04/15/16 186 lb (84.4 kg)  04/09/16 186 lb 9.6 oz (84.6 kg)      ASSESSMENT AND PLAN:  # Pulmonary Embolism: Mr. Doenges had a recurrent PE and will therefore require lifelong anticoagulation.  He has an upcoming appointment with his Oncologist, at which time secondary causes may be considered.  There was no evidence of recurrent malignancy on his chest CT.  This CT only showed minimal chronic thrombus in the RLL.  # Moderate-Severe Mitral Regurgitation: Mr. Avellino was in the process of being worked up for mitral valve repair/replacement when he was noted to have a PE.  He continues to have exertional symptoms.  We will refer him for LHC/RHC to advance his pre-surgical planning.  He has an appointment with Dr. Roxy Manns next month.  Although he has only minimal chronic thrombus, would avoid the RLL during his RHC.  He will hold Xarelto 3 days prior to cath.  He will take  Aspirin 81mg  daily while holding Xarelto.  # PAT, PACs: Symptoms have improved since restarting metoprolol 12.5 mg prn.  He had orthostatic hypotension on bid dosing.     Current medicines are reviewed at length with the patient today.  The patient does not have concerns regarding medicines.  The following changes have been made:  none  Labs/ tests ordered today include:   Orders Placed This Encounter  Procedures  . Procedural/ Surgical Case Request: Right/Left Heart Cath and Coronary Angiography  . COMPLETE METABOLIC  PANEL WITH GFR  . CBC  . INR/PT  . APTT  . TSH  . LEFT AND RIGHT HEART CATHETERIZATION WITH CORONARY ANGIOGRAM     Disposition:   FU in 3 months.   Signed, Sherica Paternostro C. Oval Linsey, MD, [facility]  07/28/2016 6:01 AM    [place] and

## 2016-07-28 SURGERY — RIGHT/LEFT HEART CATH AND CORONARY ANGIOGRAPHY
Anesthesia: Moderate Sedation

## 2016-08-02 ENCOUNTER — Telehealth: Payer: Self-pay | Admitting: Internal Medicine

## 2016-08-02 ENCOUNTER — Telehealth: Payer: Self-pay | Admitting: *Deleted

## 2016-08-02 ENCOUNTER — Ambulatory Visit: Payer: No Typology Code available for payment source | Admitting: Internal Medicine

## 2016-08-02 ENCOUNTER — Other Ambulatory Visit: Payer: No Typology Code available for payment source

## 2016-08-02 NOTE — Telephone Encounter (Signed)
09/25 Appointments rescheduled per patient request to 10/04.

## 2016-08-02 NOTE — Telephone Encounter (Signed)
"  I've been trying to call to reschedule today's appointments.  I have transportation problems and can't get there this morning."  Call transferred to scheduling.

## 2016-08-11 ENCOUNTER — Telehealth: Payer: Self-pay | Admitting: Internal Medicine

## 2016-08-11 ENCOUNTER — Telehealth: Payer: Self-pay | Admitting: *Deleted

## 2016-08-11 ENCOUNTER — Encounter: Payer: Self-pay | Admitting: Internal Medicine

## 2016-08-11 ENCOUNTER — Other Ambulatory Visit (HOSPITAL_BASED_OUTPATIENT_CLINIC_OR_DEPARTMENT_OTHER): Payer: No Typology Code available for payment source

## 2016-08-11 ENCOUNTER — Ambulatory Visit (HOSPITAL_BASED_OUTPATIENT_CLINIC_OR_DEPARTMENT_OTHER): Payer: No Typology Code available for payment source | Admitting: Internal Medicine

## 2016-08-11 VITALS — BP 133/97 | HR 78 | Temp 97.9°F | Resp 20 | Ht 69.0 in | Wt 192.1 lb

## 2016-08-11 DIAGNOSIS — I2699 Other pulmonary embolism without acute cor pulmonale: Secondary | ICD-10-CM

## 2016-08-11 DIAGNOSIS — Z8572 Personal history of non-Hodgkin lymphomas: Secondary | ICD-10-CM

## 2016-08-11 DIAGNOSIS — T451X5A Adverse effect of antineoplastic and immunosuppressive drugs, initial encounter: Secondary | ICD-10-CM

## 2016-08-11 DIAGNOSIS — Z7901 Long term (current) use of anticoagulants: Secondary | ICD-10-CM | POA: Diagnosis not present

## 2016-08-11 DIAGNOSIS — D630 Anemia in neoplastic disease: Secondary | ICD-10-CM

## 2016-08-11 DIAGNOSIS — I2782 Chronic pulmonary embolism: Secondary | ICD-10-CM | POA: Diagnosis not present

## 2016-08-11 DIAGNOSIS — G620 Drug-induced polyneuropathy: Secondary | ICD-10-CM

## 2016-08-11 DIAGNOSIS — D72819 Decreased white blood cell count, unspecified: Secondary | ICD-10-CM

## 2016-08-11 LAB — COMPREHENSIVE METABOLIC PANEL
ALT: 38 U/L (ref 0–55)
AST: 72 U/L — ABNORMAL HIGH (ref 5–34)
Albumin: 4.3 g/dL (ref 3.5–5.0)
Alkaline Phosphatase: 46 U/L (ref 40–150)
Anion Gap: 11 mEq/L (ref 3–11)
BUN: 13.7 mg/dL (ref 7.0–26.0)
CO2: 28 mEq/L (ref 22–29)
Calcium: 9.9 mg/dL (ref 8.4–10.4)
Chloride: 102 mEq/L (ref 98–109)
Creatinine: 1 mg/dL (ref 0.7–1.3)
EGFR: >90 mL/min/{1.73_m2} (ref >90)
Glucose: 96 mg/dl (ref 70–140)
Potassium: 4.7 mEq/L (ref 3.5–5.1)
Sodium: 141 mEq/L (ref 136–145)
Total Bilirubin: 1.09 mg/dL (ref 0.20–1.20)
Total Protein: 7.8 g/dL (ref 6.4–8.3)

## 2016-08-11 LAB — CBC WITH DIFFERENTIAL/PLATELET
BASO%: 1 % (ref 0.0–2.0)
Basophils Absolute: 0 10*3/uL (ref 0.0–0.1)
EOS%: 2.3 % (ref 0.0–7.0)
Eosinophils Absolute: 0.1 10*3/uL (ref 0.0–0.5)
HCT: 36.7 % — ABNORMAL LOW (ref 38.4–49.9)
HGB: 12 g/dL — ABNORMAL LOW (ref 13.0–17.1)
LYMPH%: 25.2 % (ref 14.0–49.0)
MCH: 26.5 pg — ABNORMAL LOW (ref 27.2–33.4)
MCHC: 32.7 g/dL (ref 32.0–36.0)
MCV: 81 fL (ref 79.3–98.0)
MONO#: 0.6 10*3/uL (ref 0.1–0.9)
MONO%: 18.8 % — ABNORMAL HIGH (ref 0.0–14.0)
NEUT#: 1.6 10*3/uL (ref 1.5–6.5)
NEUT%: 52.7 % (ref 39.0–75.0)
Platelets: 162 10*3/uL (ref 140–400)
RBC: 4.53 10*6/uL (ref 4.20–5.82)
RDW: 16.1 % — ABNORMAL HIGH (ref 11.0–14.6)
WBC: 3 10*3/uL — ABNORMAL LOW (ref 4.0–10.3)
lymph#: 0.8 10*3/uL — ABNORMAL LOW (ref 0.9–3.3)
nRBC: 1 % — ABNORMAL HIGH (ref 0–0)

## 2016-08-11 LAB — LACTATE DEHYDROGENASE: LDH: 314 U/L — ABNORMAL HIGH (ref 125–245)

## 2016-08-11 NOTE — Telephone Encounter (Signed)
"  I need to check on an appointment."  Provided today's appt times for lab at 1:30 and MD at 2:00.  No further questions.

## 2016-08-11 NOTE — Telephone Encounter (Signed)
Avs report and appointment schedule given to patient, per 08/11/16 los. °

## 2016-08-11 NOTE — Progress Notes (Signed)
[facility] [phone]   Fax:([phone]) [place], [place] [address]  DIAGNOSIS:  1) Stage II, large B-cell non-Hodgkin's lymphoma diagnosed in September 2012 with a scalp lesion. In addition he had mediastinal lymphadenopathy.  2) bilateral pulmonary embolism diagnosed on CT scan of the chest on 12/28/2012.   PRIOR THERAPY:  1) Systemic chemotherapy with CHOP/Rituxan, given every 3 weeks with Neulasta support, status post 6 cycles, last dose was given 12/01/2011 with complete response.  2) Xarelto 20 mg by mouth a [place].   CURRENT THERAPY: Observation.  INTERVAL HISTORY: Leonard Bentley 55 y.o. male returns to the clinic today for annual followup visit. The patient is feeling fine today with no specific complaints. He was diagnosed in June 2017 with recurrent pulmonary embolism and the patient is currently on Xarelto. He denied having any significant fatigue or weakness. He denied having any weight loss or night sweats. He denied having any chest pain, shortness of breath, cough or hemoptysis. He has no nausea or vomiting. He has no fever or chills. He had repeat CT scan of the chest last month in addition to lab work earlier today and he is here for evaluation and discussion of his scan and lab results.  MEDICAL HISTORY: Past Medical History:  Diagnosis Date  . Acute pulmonary embolism ([facility]) 04/11/2016  . Atrial tachycardia ([facility]) 01/06/2016  . Cancer ([facility]) 07/2011   STAGE II NON- HODGKIN LYMPHOMA  . Depression   . ED (erectile dysfunction)   . Lymphoma, non Hodgkin's 09/25/2011  . Mitral regurgitation 01/20/2016  . Mobitz I 10/09/2015  . Murmur 01/06/2016  . PAC (premature atrial contraction) 10/09/2015  . Pulmonary emboli ([facility])     ALLERGIES:  has No Known Allergies.  MEDICATIONS:  Current Outpatient Prescriptions  Medication Sig Dispense Refill  . acetaminophen (TYLENOL) 325 MG  tablet Take 650 mg by mouth every 6 (six) hours as needed for mild pain.    Marland Kitchen aspirin EC 81 MG tablet Take 81 mg by mouth daily.    . metoprolol succinate (TOPROL XL) 25 MG 24 hr tablet 1/2 tablet by mouth daily (Patient taking differently: Take 12.5 mg by mouth daily as needed. ) 15 tablet 3  . rivaroxaban (XARELTO) 20 MG TABS tablet Take 1 tablet (20 mg total) by mouth daily with supper. (Patient taking differently: Take 20 mg by mouth daily with supper. Will stop prior to surgery) 30 tablet 5   No current facility-administered medications for this visit.     SURGICAL HISTORY:  Past Surgical History:  Procedure Laterality Date  . NO PAST SURGERIES    . TEE WITHOUT CARDIOVERSION N/A 02/11/2016   Procedure: TRANSESOPHAGEAL ECHOCARDIOGRAM (TEE);  Surgeon: [name], MD;  Location: [facility] ENDOSCOPY;  Service: Cardiovascular;  Laterality: N/A;  . TRIGGER FINGER RELEASE Bilateral     REVIEW OF SYSTEMS:  A comprehensive review of systems was negative.   PHYSICAL EXAMINATION: General appearance: alert, cooperative and no distress Head: Normocephalic, without obvious abnormality, atraumatic Neck: no adenopathy Lymph nodes: Cervical, supraclavicular, and axillary nodes normal. Resp: clear to auscultation bilaterally Cardio: regular rate and rhythm, S1, S2 normal, no murmur, click, rub or gallop GI: soft, non-tender; bowel sounds normal; no masses,  no organomegaly Extremities: extremities normal, atraumatic, no cyanosis or edema  ECOG PERFORMANCE STATUS: 0 - Asymptomatic  Blood pressure (!) 133/97, pulse 78, temperature 97.9 F (36.6 C), temperature source Oral, resp. rate  20, height 5\' 9"  (1.753 m), weight 192 lb 1.6 oz (87.1 kg), SpO2 100 %.  LABORATORY DATA: Lab Results  Component Value Date   WBC 3.0 (L) 08/11/2016   HGB 12.0 (L) 08/11/2016   HCT 36.7 (L) 08/11/2016   MCV 81.0 08/11/2016   PLT 162 08/11/2016      Chemistry      Component Value Date/Time   NA 140 06/14/2016  1424   NA 142 02/02/2016 0840   K 4.2 06/14/2016 1424   K 4.5 02/02/2016 0840   CL 100 (L) 06/14/2016 1424   CL 98 12/28/2012 0824   CO2 28 06/14/2016 1424   CO2 26 02/02/2016 0840   BUN 12 06/14/2016 1424   BUN 16.0 02/02/2016 0840   CREATININE 0.95 06/14/2016 1424   CREATININE 1.1 02/02/2016 0840      Component Value Date/Time   CALCIUM 9.2 06/14/2016 1424   CALCIUM 8.9 02/02/2016 0840   ALKPHOS 39 06/14/2016 1424   ALKPHOS 34 (L) 02/02/2016 0840   AST 64 (H) 06/14/2016 1424   AST 53 (H) 02/02/2016 0840   ALT 45 06/14/2016 1424   ALT 40 02/02/2016 0840   BILITOT 0.5 06/14/2016 1424   BILITOT 0.64 02/02/2016 0840       RADIOGRAPHIC STUDIES: Ct Angio Chest Pe W/cm &/or Wo Cm  Result Date: 07/20/2016 CLINICAL DATA:  Pulmonary embolus in the right lower lobe in June of 2017. Follow-up. History of non-Hodgkin's lymphoma in remission. EXAM: CT ANGIOGRAPHY CHEST WITH CONTRAST TECHNIQUE: Multidetector CT imaging of the chest was performed using the standard protocol during bolus administration of intravenous contrast. Multiplanar CT image reconstructions and MIPs were obtained to evaluate the vascular anatomy. CONTRAST:  100 mL of Isovue 370 COMPARISON:  April 09, 2016 FINDINGS: Cardiovascular: The thoracic aorta is normal in caliber with no aneurysm or dissection. The previously seen pulmonary embolus in the right lower lobe is much smaller in the interval with only a small amount of residual linear thrombus remaining. No new emboli. The heart is unchanged. Mediastinum/Nodes: No adenopathy in the bilateral axilla, retropectoral regions, or base of neck. Adenopathy persists within the mediastinum and hila, similar in the interval. A representative precarinal node on series 5, image 36 measures 18 mm in AP dimension, stable. An AP window node on image 37 measures 15 mm in short axis versus 16 mm previously. A prevascular node on image 39 measures 11 mm, unchanged in the interval. A right hilar  node measures 2.4 cm in greatest dimension versus 2.2 cm previously, similar in the interval. A subcarinal node measures 1.8 cm today versus 1.7 cm previously. No definitive new or enlarging nodes. The esophagus and trachea are unremarkable. Lungs/Pleura: The central airways are normal. No pneumothorax. Innumerable tiny pulmonary nodules seen in the lungs similar in the interval. Most of these mild nodules measure 2 or 3 mm. There is also nodularity along the pleural surfaces which is stable. The nodularity does not extend along the fissures and is not in a perivascular distribution. No mass or focal infiltrate. Upper Abdomen: Numerous hepatic cysts with no suspicious hepatic masses. Evaluation of the upper abdomen is otherwise limited due to timing of contrast but no other abnormalities are seen. No enlarged lymph nodes are identified in the upper abdomen. Musculoskeletal: No chest wall mass or suspicious bone lesions identified. Review of the MIP images confirms the above findings. IMPRESSION: 1. The burden of pulmonary embolus in the right lower lobe has significantly diminished with only minimal linear  thrombus remaining. The findings are consistent with chronic thrombus with no acute or new thrombus identified. 2. Persistent adenopathy in the mediastinum and hila correlates with the patient's history of non-Hodgkin's lymphoma. There has been no definitive interval change. 3. Persistent miliary pattern of nodularity in the lungs as well as numerous tiny nodules abutting the pleural surfaces. These findings are nonspecific. Sequela of malignancy including lymphoma could result in these findings. However, a variety of atypical infectious and inflammatory causes could present with a miliary pattern as well. Again, these findings are unchanged since June of 2017. Recommend clinical correlation and follow-up as clinically warranted. These results will be called to the ordering clinician or representative by the  Radiologist Assistant, and communication documented in the PACS or zVision Dashboard. Electronically Signed   By: Dorise Bullion III M.D   On: 07/20/2016 16:25   ASSESSMENT AND PLAN: This is a very pleasant 55 years old African American male with history of stage II large B-cell non-Hodgkin lymphoma status post systemic chemotherapy with CHOP/Rituxan with complete response and has been observation since January of 2013 with no evidence for disease recurrence. His CBC today showed persistent leukocytopenia, neutropenia and anemia most likely secondary to treatment with diclofenac as well as previous chemotherapy.  I recommended for the patient to continue on observation for now. I will see him back for follow-up visit in 6 months for reevaluation with repeat CBC, comprehensive metabolic panel and LDH and CT scan of the chest, abdomen and pelvis for restaging of his disease. For the recurrent pulmonary embolism, he will continue on Xarelto. I will consider him for repeat scan only of the patient has any concerning symptoms. He was advised to call immediately if he has any concerning symptoms in the interval.  The patient voices understanding of current disease status and treatment options and is in agreement with the current care plan.  All questions were answered. The patient knows to call the clinic with any problems, questions or concerns. We can certainly see the patient much sooner if necessary.  Disclaimer: This note was dictated with voice recognition software. Similar sounding words can inadvertently be transcribed and may be missed upon review.

## 2016-08-13 ENCOUNTER — Telehealth: Payer: Self-pay | Admitting: Cardiovascular Disease

## 2016-08-13 ENCOUNTER — Ambulatory Visit (HOSPITAL_COMMUNITY)
Admission: RE | Admit: 2016-08-13 | Discharge: 2016-08-13 | Disposition: A | Discharge status: Home / Self Care | Payer: No Typology Code available for payment source | Source: Ambulatory Visit | Attending: Interventional Cardiology | Admitting: Interventional Cardiology

## 2016-08-13 ENCOUNTER — Encounter (HOSPITAL_COMMUNITY)
Admission: RE | Disposition: A | Discharge status: Home / Self Care | Payer: Self-pay | Source: Ambulatory Visit | Attending: Interventional Cardiology

## 2016-08-13 ENCOUNTER — Encounter (HOSPITAL_COMMUNITY): Payer: Self-pay | Admitting: Interventional Cardiology

## 2016-08-13 DIAGNOSIS — Z87891 Personal history of nicotine dependence: Secondary | ICD-10-CM | POA: Insufficient documentation

## 2016-08-13 DIAGNOSIS — Z823 Family history of stroke: Secondary | ICD-10-CM | POA: Diagnosis not present

## 2016-08-13 DIAGNOSIS — Z86711 Personal history of pulmonary embolism: Secondary | ICD-10-CM | POA: Insufficient documentation

## 2016-08-13 DIAGNOSIS — I491 Atrial premature depolarization: Secondary | ICD-10-CM | POA: Insufficient documentation

## 2016-08-13 DIAGNOSIS — F329 Major depressive disorder, single episode, unspecified: Secondary | ICD-10-CM | POA: Insufficient documentation

## 2016-08-13 DIAGNOSIS — I340 Nonrheumatic mitral (valve) insufficiency: Secondary | ICD-10-CM | POA: Diagnosis not present

## 2016-08-13 DIAGNOSIS — Z9221 Personal history of antineoplastic chemotherapy: Secondary | ICD-10-CM | POA: Insufficient documentation

## 2016-08-13 DIAGNOSIS — Z8572 Personal history of non-Hodgkin lymphomas: Secondary | ICD-10-CM | POA: Insufficient documentation

## 2016-08-13 DIAGNOSIS — I441 Atrioventricular block, second degree: Secondary | ICD-10-CM | POA: Diagnosis not present

## 2016-08-13 DIAGNOSIS — I341 Nonrheumatic mitral (valve) prolapse: Secondary | ICD-10-CM | POA: Insufficient documentation

## 2016-08-13 DIAGNOSIS — I951 Orthostatic hypotension: Secondary | ICD-10-CM | POA: Insufficient documentation

## 2016-08-13 DIAGNOSIS — Z7901 Long term (current) use of anticoagulants: Secondary | ICD-10-CM | POA: Insufficient documentation

## 2016-08-13 DIAGNOSIS — Z8249 Family history of ischemic heart disease and other diseases of the circulatory system: Secondary | ICD-10-CM | POA: Insufficient documentation

## 2016-08-13 DIAGNOSIS — N529 Male erectile dysfunction, unspecified: Secondary | ICD-10-CM | POA: Diagnosis not present

## 2016-08-13 HISTORY — PX: CARDIAC CATHETERIZATION: SHX172

## 2016-08-13 LAB — POCT I-STAT 3, ART BLOOD GAS (G3+)
Bicarbonate: 24.9 mmol/L (ref 20.0–28.0)
O2 Saturation: 94 %
TCO2: 26 mmol/L (ref 0–100)
pCO2 arterial: 39.9 mmHg (ref 32.0–48.0)
pH, Arterial: 7.403 (ref 7.350–7.450)
pO2, Arterial: 70 mmHg — ABNORMAL LOW (ref 83.0–108.0)

## 2016-08-13 LAB — POCT I-STAT 3, VENOUS BLOOD GAS (G3P V)
Acid-Base Excess: 1 mmol/L (ref 0.0–2.0)
Bicarbonate: 26.2 mmol/L (ref 20.0–28.0)
O2 Saturation: 65 %
TCO2: 27 mmol/L (ref 0–100)
pCO2, Ven: 43.7 mmHg — ABNORMAL LOW (ref 44.0–60.0)
pH, Ven: 7.385 (ref 7.250–7.430)
pO2, Ven: 35 mmHg (ref 32.0–45.0)

## 2016-08-13 SURGERY — RIGHT/LEFT HEART CATH AND CORONARY ANGIOGRAPHY

## 2016-08-13 MED ORDER — FENTANYL CITRATE (PF) 100 MCG/2ML IJ SOLN
INTRAMUSCULAR | Status: DC | PRN
  Administered 2016-08-13: 50 ug via INTRAVENOUS

## 2016-08-13 MED ORDER — MIDAZOLAM HCL 2 MG/2ML IJ SOLN
INTRAMUSCULAR | Status: AC
  Filled 2016-08-13: qty 2

## 2016-08-13 MED ORDER — SODIUM CHLORIDE 0.9 % IV SOLN: 250.0000 mL | INTRAVENOUS | Status: DC | PRN

## 2016-08-13 MED ORDER — SODIUM CHLORIDE 0.9 % WEIGHT BASED INFUSION
3.0000 mL/kg/h | INTRAVENOUS | Status: DC
  Administered 2016-08-13: 3 mL/kg/h via INTRAVENOUS

## 2016-08-13 MED ORDER — LIDOCAINE HCL (PF) 1 % IJ SOLN
INTRAMUSCULAR | Status: DC | PRN
  Administered 2016-08-13 (×2): 2 mL via INTRADERMAL

## 2016-08-13 MED ORDER — HEPARIN SODIUM (PORCINE) 1000 UNIT/ML IJ SOLN
INTRAMUSCULAR | Status: AC
  Filled 2016-08-13: qty 1

## 2016-08-13 MED ORDER — ASPIRIN 81 MG PO CHEW
CHEWABLE_TABLET | ORAL | Status: AC
  Filled 2016-08-13: qty 1

## 2016-08-13 MED ORDER — RIVAROXABAN 20 MG PO TABS: 20.0000 mg | ORAL_TABLET | Freq: Every day | ORAL | 5 refills | Status: DC

## 2016-08-13 MED ORDER — HEPARIN SODIUM (PORCINE) 1000 UNIT/ML IJ SOLN
INTRAMUSCULAR | Status: DC | PRN
  Administered 2016-08-13: 4500 [IU] via INTRAVENOUS

## 2016-08-13 MED ORDER — HEPARIN (PORCINE) IN NACL 2-0.9 UNIT/ML-% IJ SOLN
INTRAMUSCULAR | Status: AC
  Filled 2016-08-13: qty 500

## 2016-08-13 MED ORDER — HEPARIN (PORCINE) IN NACL 2-0.9 UNIT/ML-% IJ SOLN
INTRAMUSCULAR | Status: DC | PRN
  Administered 2016-08-13: 1000 mL

## 2016-08-13 MED ORDER — MIDAZOLAM HCL 2 MG/2ML IJ SOLN
INTRAMUSCULAR | Status: DC | PRN
  Administered 2016-08-13: 2 mg via INTRAVENOUS

## 2016-08-13 MED ORDER — SODIUM CHLORIDE 0.9 % WEIGHT BASED INFUSION: 1.0000 mL/kg/h | INTRAVENOUS | Status: AC

## 2016-08-13 MED ORDER — SODIUM CHLORIDE 0.9% FLUSH: 3.0000 mL | INTRAVENOUS | Status: DC | PRN

## 2016-08-13 MED ORDER — HEPARIN (PORCINE) IN NACL 2-0.9 UNIT/ML-% IJ SOLN
INTRAMUSCULAR | Status: DC | PRN
  Administered 2016-08-13: 10 mL via INTRA_ARTERIAL

## 2016-08-13 MED ORDER — IOPAMIDOL (ISOVUE-370) INJECTION 76%
INTRAVENOUS | Status: DC | PRN
  Administered 2016-08-13: 100 mL via INTRA_ARTERIAL

## 2016-08-13 MED ORDER — SODIUM CHLORIDE 0.9 % WEIGHT BASED INFUSION: 1.0000 mL/kg/h | INTRAVENOUS | Status: DC

## 2016-08-13 MED ORDER — SODIUM CHLORIDE 0.9% FLUSH: 3.0000 mL | Freq: Two times a day (BID) | INTRAVENOUS | Status: DC

## 2016-08-13 MED ORDER — ASPIRIN 81 MG PO CHEW
81.0000 mg | CHEWABLE_TABLET | ORAL | Status: AC
  Administered 2016-08-13: 81 mg via ORAL

## 2016-08-13 MED ORDER — LIDOCAINE HCL (PF) 1 % IJ SOLN
INTRAMUSCULAR | Status: AC
  Filled 2016-08-13: qty 30

## 2016-08-13 MED ORDER — IOPAMIDOL (ISOVUE-370) INJECTION 76%
INTRAVENOUS | Status: AC
  Filled 2016-08-13: qty 100

## 2016-08-13 MED ORDER — VERAPAMIL HCL 2.5 MG/ML IV SOLN
INTRAVENOUS | Status: AC
  Filled 2016-08-13: qty 2

## 2016-08-13 MED ORDER — FENTANYL CITRATE (PF) 100 MCG/2ML IJ SOLN
INTRAMUSCULAR | Status: AC
  Filled 2016-08-13: qty 2

## 2016-08-13 SURGICAL SUPPLY — 12 items
CATH 5FR JL3.5 JR4 ANG PIG MP (CATHETERS) ×1 IMPLANT
CATH BALLN WEDGE 5F 110CM (CATHETERS) ×1 IMPLANT
DEVICE RAD COMP TR BAND LRG (VASCULAR PRODUCTS) ×1 IMPLANT
GLIDESHEATH SLEND SS 6F .021 (SHEATH) ×1 IMPLANT
KIT HEART LEFT (KITS) ×2 IMPLANT
PACK CARDIAC CATHETERIZATION (CUSTOM PROCEDURE TRAY) ×2 IMPLANT
SHEATH FAST CATH BRACH 5F 5CM (SHEATH) ×1 IMPLANT
SYR MEDRAD MARK V 150ML (SYRINGE) ×2 IMPLANT
TRANSDUCER W/STOPCOCK (MISCELLANEOUS) ×3 IMPLANT
TUBING CIL FLEX 10 FLL-RA (TUBING) ×2 IMPLANT
WIRE HI TORQ VERSACORE-J 145CM (WIRE) ×1 IMPLANT
WIRE SAFE-T 1.5MM-J .035X260CM (WIRE) ×1 IMPLANT

## 2016-08-13 NOTE — Progress Notes (Signed)
Site area: right brachial  Site Prior to Removal:  Level 0  Pressure Applied For 20 MINUTES    Minutes Beginning a [id number]  Manual:   yes  Patient Status During Pull :excellent  Post Pull Groin Site:  Level 0  Post Pull Instructions Given:  yes  Post Pull Pulses Present:  yes  Dressing Applied:  yes  Comments:  Pt. Tolerated procedure well.

## 2016-08-13 NOTE — Interval H&P Note (Signed)
Cath Lab Visit (complete for each Cath Lab visit)  Clinical Evaluation Leading to the Procedure:   ACS: No.  Non-ACS:    Anginal Classification: CCS II  Anti-ischemic medical therapy: Minimal Therapy (1 class of medications)  Non-Invasive Test Results: No non-invasive testing performed  Prior CABG: No previous CABG      History and Physical Interval Note:  08/13/2016 7:41 AM  Leonard Bentley  has presented today for surgery, with the diagnosis of cp  The various methods of treatment have been discussed with the patient and family. After consideration of risks, benefits and other options for treatment, the patient has consented to  Procedure(s): Right/Left Heart Cath and Coronary Angiography (N/A) as a surgical intervention .  The patient's history has been reviewed, patient examined, no change in status, stable for surgery.  I have reviewed the patient's chart and labs.  Questions were answered to the patient's satisfaction.     Larae Grooms

## 2016-08-13 NOTE — Discharge Instructions (Signed)
Radial Site Care °Refer to this sheet in the next few weeks. These instructions provide you with information about caring for yourself after your procedure. Your health care provider may also give you more specific instructions. Your treatment has been planned according to current medical practices, but problems sometimes occur. Call your health care provider if you have any problems or questions after your procedure. °WHAT TO EXPECT AFTER THE PROCEDURE °After your procedure, it is typical to have the following: °· Bruising at the radial site that usually fades within 1-2 weeks. °· Blood collecting in the tissue (hematoma) that may be painful to the touch. It should usually decrease in size and tenderness within 1-2 weeks. °HOME CARE INSTRUCTIONS °· Take medicines only as directed by your health care provider. °· You may shower 24-48 hours after the procedure or as directed by your health care provider. Remove the bandage (dressing) and gently wash the site with plain soap and water. Pat the area dry with a clean towel. Do not rub the site, because this may cause bleeding. °· Do not take baths, swim, or use a hot tub until your health care provider approves. °· Check your insertion site every [place] for redness, swelling, or drainage. °· Do not apply powder or lotion to the site. °· Do not flex or bend the affected arm for 24 hours or as directed by your health care provider. °· Do not push or pull heavy objects with the affected arm for 24 hours or as directed by your health care provider. °· Do not lift over 10 lb (4.5 kg) for 5 days after your procedure or as directed by your health care provider. °· Ask your health care provider when it is okay to: °¨ Return to work or school. °¨ Resume usual physical activities or sports. °¨ Resume sexual activity. °· Do not drive home if you are discharged the same [place] as the procedure. Have someone else drive you. °· You may drive 24 hours after the procedure unless otherwise  instructed by your health care provider. °· Do not operate machinery or power tools for 24 hours after the procedure. °· If your procedure was done as an outpatient procedure, which means that you went home the same [place] as your procedure, a responsible adult should be with you for the first 24 hours after you arrive home. °· Keep all follow-up visits as directed by your health care provider. This is important. °SEEK MEDICAL CARE IF: °· You have a fever. °· You have chills. °· You have increased bleeding from the radial site. Hold pressure on the site. °SEEK IMMEDIATE MEDICAL CARE IF: °· You have unusual pain at the radial site. °· You have redness, warmth, or swelling at the radial site. °· You have drainage (other than a small amount of blood on the dressing) from the radial site. °· The radial site is bleeding, and the bleeding does not stop after 30 minutes of holding steady pressure on the site. °· Your arm or hand becomes pale, cool, tingly, or numb. °  °This information is not intended to replace advice given to you by your health care provider. Make sure you discuss any questions you have with your health care provider. °  °Document Released: 11/27/2010 Document Revised: 11/15/2014 Document Reviewed: 05/13/2014 °Elsevier Interactive Patient Education ©2016 [employer] ° ° ° ° °Angiogram, Care After °Refer to this sheet in the next few weeks. These instructions provide you with information about caring for yourself after your procedure.   Your health care provider may also give you more specific instructions. Your treatment has been planned according to current medical practices, but problems sometimes occur. Call your health care provider if you have any problems or questions after your procedure. °WHAT TO EXPECT AFTER THE PROCEDURE °After your procedure, it is typical to have the following: °· Bruising at the catheter insertion site that usually fades within 1-2 weeks. °· Blood collecting in the tissue  (hematoma) that may be painful to the touch. It should usually decrease in size and tenderness within 1-2 weeks. °HOME CARE INSTRUCTIONS °· Take medicines only as directed by your health care provider. °· You may shower 24-48 hours after the procedure or as directed by your health care provider. Remove the bandage (dressing) and gently wash the site with plain soap and water. Pat the area dry with a clean towel. Do not rub the site, because this may cause bleeding. °· Do not take baths, swim, or use a hot tub until your health care provider approves. °· Check your insertion site every [place] for redness, swelling, or drainage. °· Do not apply powder or lotion to the site. °· Do not lift over 10 lb (4.5 kg) for 5 days after your procedure or as directed by your health care provider. °· Ask your health care provider when it is okay to: °¨ Return to work or school. °¨ Resume usual physical activities or sports. °¨ Resume sexual activity. °· Do not drive home if you are discharged the same [place] as the procedure. Have someone else drive you. °· You may drive 24 hours after the procedure unless otherwise instructed by your health care provider. °· Do not operate machinery or power tools for 24 hours after the procedure or as directed by your health care provider. °· If your procedure was done as an outpatient procedure, which means that you went home the same [place] as your procedure, a responsible adult should be with you for the first 24 hours after you arrive home. °· Keep all follow-up visits as directed by your health care provider. This is important. °SEEK MEDICAL CARE IF: °· You have a fever. °· You have chills. °· You have increased bleeding from the catheter insertion site. Hold pressure on the site. °SEEK IMMEDIATE MEDICAL CARE IF: °· You have unusual pain at the catheter insertion site. °· You have redness, warmth, or swelling at the catheter insertion site. °· You have drainage (other than a small amount of blood on  the dressing) from the catheter insertion site. °· The catheter insertion site is bleeding, and the bleeding does not stop after 30 minutes of holding steady pressure on the site. °· The area near or just beyond the catheter insertion site becomes pale, cool, tingly, or numb. °  °This information is not intended to replace advice given to you by your health care provider. Make sure you discuss any questions you have with your health care provider. °  °Document Released: 05/13/2005 Document Revised: 11/15/2014 Document Reviewed: 03/28/2013 °Elsevier Interactive Patient Education ©2016 [employer] ° ° °

## 2016-08-13 NOTE — Telephone Encounter (Signed)
New message  Pt call requesting to peak with RN to reschedule appt on 10/9. Pt also asked for a information about bp appt. Please call back to discuss

## 2016-08-13 NOTE — H&P (View-Only) (Signed)
Cardiology Office Note   Date:  07/28/2016   ID:  Bailor Dargin Lorson, DOB 1961-09-21, MRN [id number]  PCP:  Wyatt Haste, MD  Cardiologist:   [name], MD   No chief complaint on file.     Patient ID: Leonard Bentley is a 55 y.o. male with prior tobacco abuse, prior PE, and non-Hodgkin's lymphoma s/p chemotherapy who presents for follow up.   History of Present Illness:  Leonard Bentley was initially evaluated 10/2015 after his PCP, [name], noted that he had Mobitz I.  He was referred to cardiology, where he reported that he wasn't able to run as long as he used to.  He was referred for exercise Cardiolite that revealed LVEF 52% and no ischemia. On 01/20/16 he had an echo that was revealed LVEF 60-65% with moderate LVH and posterior mitral valve leaflet prolapse. There was moderate to severe mitral regurgitation.  He was referred for transesophageal echocardiography which confirmed moderate to severe mitral regurgitation. The left atrium was massively dilated but there was no left ventricle dilation.  Pulmonary pressures were normal.   He was referred to Dr. [name] for consideration of mitral valve replacement/repair.  He had a chest CT on 04/09/16 for preoperative assessment and was noted to have a pulmonary embolism in the right lower lobe.  Leonard Bentley had a PE several years ago in the setting of testosterone therapy.  He was started on Xarelto anticoagulation indefinitely.  He had a follow up CT 07/20/16 that showed no acute thrombus and resolution of most of the prior PE.  There was a minimal, chronic linear thrombus in the RLL.  Since his last appointment Leonard Bentley has been doing well.  He went to the ED 06/2016 with concern for hematuria and melena, but his blood counts were stable, his stool was guaiac negative and there was not blood on urinalysis.  He continues to exercise regularly.  However, he is only able to run 1.5 miles due to fatigue and shortness of breath.  He also likes to do  pushups and does 150 daily.  He feels week and notes shortness of breath with exertion.  He denies lower extremity edema, orthopnea or PND.  He restarted metoprolol and notes that his palpitations are better.  He only takes it as needed.   Past Medical History:  Diagnosis Date  . Acute pulmonary embolism ([place]) 04/11/2016  . Atrial tachycardia ([place]) 01/06/2016  . Cancer ([place]) 07/2011   STAGE II NON- HODGKIN LYMPHOMA  . Depression   . ED (erectile dysfunction)   . Lymphoma, non Hodgkin's 09/25/2011  . Mitral regurgitation 01/20/2016  . Mobitz I 10/09/2015  . Murmur 01/06/2016  . PAC (premature atrial contraction) 10/09/2015  . Pulmonary emboli [facility])     Past Surgical History:  Procedure Laterality Date  . NO PAST SURGERIES    . TEE WITHOUT CARDIOVERSION N/A 02/11/2016   Procedure: TRANSESOPHAGEAL ECHOCARDIOGRAM (TEE);  Surgeon: [name], MD;  Location[facility] ENDOSCOPY;  Service: Cardiovascular;  Laterality: N/A;  . TRIGGER FINGER RELEASE Bilateral      Current Outpatient Prescriptions  Medication Sig Dispense Refill  . metoprolol succinate (TOPROL XL) 25 MG 24 hr tablet 1/2 tablet by mouth daily 15 tablet 3  . rivaroxaban (XARELTO) 20 MG TABS tablet Take 1 tablet (20 mg total) by mouth daily with supper. 30 tablet 5   No current facility-administered medications for this visit.     Allergies:   Review of patient's allergies indicates  no known allergies.    Social History:  The patient  reports that he quit smoking about 3 years ago. His smoking use included Cigarettes. He has a 32.00 pack-year smoking history. He uses smokeless tobacco. He reports that he drinks alcohol. He reports that he does not use drugs.   Family History:  The patient's family history includes Cancer in his father and mother; Hypertension in his maternal grandmother; Stroke in his maternal aunt.    ROS:  Please see the history of present illness.   Otherwise, review of systems are positive for none.   All  other systems are reviewed and negative.    PHYSICAL EXAM: VS:  BP 124/80   Pulse 64   Ht 5\' 9"  (1.753 m)   Wt 192 lb (87.1 kg)   BMI 28.35 kg/m  , BMI Body mass index is 28.35 kg/m. GENERAL:  Well appearing HEENT:  Pupils equal round and reactive, fundi not visualized, oral mucosa unremarkable NECK:  No jugular venous distention, waveform within normal limits, carotid upstroke brisk and symmetric, no bruits LYMPHATICS:  No cervical adenopathy LUNGS:  Clear to auscultation bilaterally HEART:  RRR.  PMI not displaced or sustained,S1 and S2 within normal limits, + S3, no S4, no clicks, no rubs, II/VI holosystolic murmur at the apex ABD:  Flat, positive bowel sounds normal in frequency in pitch, no bruits, no rebound, no guarding, no midline pulsatile mass, no hepatomegaly, no splenomegaly EXT:  2 plus pulses throughout, no edema, no cyanosis no clubbing SKIN:  No rashes no nodules NEURO:  Cranial nerves II through XII grossly intact, motor grossly intact throughout PSYCH:  Cognitively intact, oriented to person place and time   EKG:  EKG is ordered today. 04/09/16: Sinus arrhythmia. Rate 92 bpm. First degree AV block. 09/15/15: sinus rhythm rate 87 bpm. PACs. Second degree heart block, Mobitz I.  LAFB  Exercise Cardiolite 10/15/15:  Nuclear stress EF: 52%.  There was no ST segment deviation noted during stress.  No T wave inversion was noted during stress.  The study is normal.  This is a low risk study.  Low risk stress nuclear study with normal perfusion and normal left ventricular regional systolic function. Borderline global LVEF 52%.  Echo 01/20/16: Study Conclusions  - Left ventricle: The cavity size was normal. Wall thickness was  increased in a pattern of moderate LVH. Systolic function was  normal. The estimated ejection fraction was in the range of 60%  to 65%. Wall motion was normal; there were no regional wall  motion abnormalities. The study is not  technically sufficient to  allow evaluation of LV diastolic function. - Aortic valve: Sclerosis without stenosis. There was trivial  regurgitation. - Mitral valve: Mildly thickened leaflets with predominate prolapse  of the posterior leaflet and anteriorly directed mitral  regurgitation. There was moderate to severe eccentric  regurgitation directed at the atrial septum. - Left atrium: Massively dilated at 85 ml/m2. - Tricuspid valve: There was mild regurgitation. - Pulmonary arteries: PA peak pressure: 27 mm Hg (S). - Inferior vena cava: The vessel was normal in size. The  respirophasic diameter changes were in the normal range (>= 50%),  consistent with normal central venous pressure.  Impressions:  - LVEF 60-65%, moderate LVH, trivial AI, moderate to more likely  severe eccentric MR directed anteriorly and to the interatrial  septum, posterior mitral valve leaflet prolpase, massive LAE,  mild TR, RVSP 27 mmHg, normal IVC.  TEE 02/11/16: - Left ventricle: Systolic function was  normal. The estimated  ejection fraction was in the range of 60% to 65%. Wall motion was  normal; there were no regional wall motion abnormalities. - Aortic valve: There was no regurgitation. - Mitral valve: Mild prolapse, involving the middle scallop of the  posterior leaflet. There was moderate to severe regurgitation  directed eccentrically and anteriorly. Valve area by continuity  equation (using LVOT flow): 2.55 cm^2. Effective regurgitant  orifice (PISA): 0.33 cm^2. Regurgitant volume (PISA): 48 ml. - Left atrium: No evidence of thrombus in the atrial cavity or  appendage. No evidence of thrombus in the atrial cavity or  appendage. - Right atrium: No evidence of thrombus in the atrial cavity or  appendage. - Atrial septum: No defect or patent foramen ovale was identified  on saline microcavitation study. - Tricuspid valve: There was no significant regurgitation.   Recent  Labs: 10/10/2015: TSH 0.687 06/14/2016: ALT 45; BUN 12; Creatinine, Ser 0.95; Hemoglobin 12.6; Platelets 148; Potassium 4.2; Sodium 140    Lipid Panel    Component Value Date/Time   CHOL 239 (H) 04/03/2016 0323   CHOL 238 (H) 08/18/2015 0825   TRIG 63 04/03/2016 0323   HDL 126 04/03/2016 0323   HDL 124 08/18/2015 0825   CHOLHDL 1.9 04/03/2016 0323   VLDL 13 04/03/2016 0323   LDLCALC 100 (H) 04/03/2016 0323   LDLCALC 94 08/18/2015 0825      Wt Readings from Last 3 Encounters:  07/27/16 192 lb (87.1 kg)  04/15/16 186 lb (84.4 kg)  04/09/16 186 lb 9.6 oz (84.6 kg)      ASSESSMENT AND PLAN:  # Pulmonary Embolism: Leonard Bentley had a recurrent PE and will therefore require lifelong anticoagulation.  He has an upcoming appointment with his Oncologist, at which time secondary causes may be considered.  There was no evidence of recurrent malignancy on his chest CT.  This CT only showed minimal chronic thrombus in the RLL.  # Moderate-Severe Mitral Regurgitation: Leonard Bentley was in the process of being worked up for mitral valve repair/replacement when he was noted to have a PE.  He continues to have exertional symptoms.  We will refer him for LHC/RHC to advance his pre-surgical planning.  He has an appointment with Dr. Roxy Manns next month.  Although he has only minimal chronic thrombus, would avoid the RLL during his RHC.  He will hold Xarelto 3 days prior to cath.  He will take  Aspirin 81mg  daily while holding Xarelto.  # PAT, PACs: Symptoms have improved since restarting metoprolol 12.5 mg prn.  He had orthostatic hypotension on bid dosing.     Current medicines are reviewed at length with the patient today.  The patient does not have concerns regarding medicines.  The following changes have been made:  none  Labs/ tests ordered today include:   Orders Placed This Encounter  Procedures  . Procedural/ Surgical Case Request: Right/Left Heart Cath and Coronary Angiography  . COMPLETE METABOLIC  PANEL WITH GFR  . CBC  . INR/PT  . APTT  . TSH  . LEFT AND RIGHT HEART CATHETERIZATION WITH CORONARY ANGIOGRAM     Disposition:   FU in 3 months.   Signed, Dessirae Scarola C. Oval Linsey, MD, [facility]  07/28/2016 6:01 AM    [place] and

## 2016-08-13 NOTE — Telephone Encounter (Signed)
BP appt cancelled at patient request. He is aware his BP was good at today's cath procedure. Will call if he wants to reschedule a visit.

## 2016-08-23 ENCOUNTER — Encounter: Payer: Self-pay | Admitting: Thoracic Surgery (Cardiothoracic Vascular Surgery)

## 2016-08-23 ENCOUNTER — Ambulatory Visit (INDEPENDENT_AMBULATORY_CARE_PROVIDER_SITE_OTHER): Payer: No Typology Code available for payment source | Admitting: Thoracic Surgery (Cardiothoracic Vascular Surgery)

## 2016-08-23 ENCOUNTER — Other Ambulatory Visit: Payer: Self-pay | Admitting: *Deleted

## 2016-08-23 VITALS — BP 130/90 | HR 100 | Resp 20 | Ht 69.0 in | Wt 195.0 lb

## 2016-08-23 DIAGNOSIS — I340 Nonrheumatic mitral (valve) insufficiency: Secondary | ICD-10-CM

## 2016-08-23 DIAGNOSIS — I341 Nonrheumatic mitral (valve) prolapse: Secondary | ICD-10-CM | POA: Diagnosis not present

## 2016-08-23 NOTE — Progress Notes (Signed)
[address]       [place] [address]             [phone]     CARDIOTHORACIC SURGERY OFFICE NOTE  Referring Provider is [name], MD PCP is Wyatt Haste, MD   HPI:  Patient is a 55 year old male with history of mitral valve prolapse and severe mitral regurgitation, stage II large B cell non-Hodgkin's lymphoma diagnosed in 2012 treated with systemic chemotherapy, recurrent pulmonary embolism on long-term anticoagulation, and Mobitz type I second-degree AV block with frequent non-conducted PAC's associated with symptoms of palpitations who returns to the office today for follow-up of severe mitral regurgitation. He was originally seen in consultation on 02/05/2016 after he had undergone transesophageal echocardiogram demonstrating the presence of mitral valve prolapse with severe mitral regurgitation. He underwent CT angiogram of the chest, abdomen, and pelvis as part of his routine preoperative workup for possible elective namely invasive mitral valve repair. He was found to have a recurrent pulmonary embolus and was placed back on anticoagulation using Xarelto, and plans for elective mitral valve repair were postponed.  He was seen in follow-up recently by Dr. Oval Linsey and reportedly doing quite well, although the patient does complain of mild exertional shortness of breath associated with more strenuous exertion. The patient exercises routinely and has noticed a significant change in his exercise tolerance. Palpitations have been much improved on beta blocker therapy.  Follow-up CT angiogram of the chest revealed resolution of the pulmonary embolus with only minimal residual linear atelectasis and clot at the right lung base. The patient is now interested in proceeding with elective mitral valve repair and he recently underwent routine left and right heart catheterization. He was found to have no significant coronary artery disease and normal pulmonary  artery pressures.  The patient has also been seen recently by Dr. Inda Merlin follows him because of his history of non-Hodgkin's lymphoma. The patient was noted to have stable leukocytopenia, neutropenia, and anemia that were felt to be secondary to his treatment with diclofenac and previous chemotherapy. The patient returns to our office and reports that he feels well. He exercises routinely on a regular basis. He states that he gets short of breath with strenuous activity only. He denies any resting shortness of breath, PND, orthopnea, or lower extremity edema. He has not been having palpitations recently. He does report occasional cramps in his hands and legs that do not occur with activity but typically occur only at rest.  The remainder of the patient's review of systems is unremarkable in the remainder of his past medical history is unchanged from previously.   Current Outpatient Prescriptions  Medication Sig Dispense Refill  . acetaminophen (TYLENOL) 325 MG tablet Take 650 mg by mouth every 6 (six) hours as needed for mild pain.    Marland Kitchen aspirin EC 81 MG tablet Take 81 mg by mouth daily.    . metoprolol succinate (TOPROL XL) 25 MG 24 hr tablet 1/2 tablet by mouth daily (Patient not taking: Reported on 08/23/2016) 15 tablet 3  . rivaroxaban (XARELTO) 20 MG TABS tablet Take 1 tablet (20 mg total) by mouth daily with supper. 30 tablet 5   No current facility-administered medications for this visit.       Physical Exam:   BP 130/90   Pulse 100   Resp 20   Ht 5\' 9"  (1.753 m)   Wt 195 lb (88.5 kg)   BMI 28.80 kg/m   General:  Well-appearing  Chest:   Clear to auscultation  CV:   Regular rate and rhythm with holosystolic murmur  Incisions:  n/a  Abdomen:  Soft nontender  Extremities:  Warm and well-perfused, no edema  Diagnostic Tests:  CT ANGIOGRAPHY CHEST WITH CONTRAST  TECHNIQUE: Multidetector CT imaging of the chest was performed using the standard protocol during bolus  administration of intravenous contrast. Multiplanar CT image reconstructions and MIPs were obtained to evaluate the vascular anatomy.  CONTRAST:  100 mL of Isovue 370  COMPARISON:  April 09, 2016  FINDINGS: Cardiovascular: The thoracic aorta is normal in caliber with no aneurysm or dissection. The previously seen pulmonary embolus in the right lower lobe is much smaller in the interval with only a small amount of residual linear thrombus remaining. No new emboli. The heart is unchanged.  Mediastinum/Nodes: No adenopathy in the bilateral axilla, retropectoral regions, or base of neck. Adenopathy persists within the mediastinum and hila, similar in the interval. A representative precarinal node on series 5, image 36 measures 18 mm in AP dimension, stable. An AP window node on image 37 measures 15 mm in short axis versus 16 mm previously. A prevascular node on image 39 measures 11 mm, unchanged in the interval. A right hilar node measures 2.4 cm in greatest dimension versus 2.2 cm previously, similar in the interval. A subcarinal node measures 1.8 cm today versus 1.7 cm previously. No definitive new or enlarging nodes. The esophagus and trachea are unremarkable.  Lungs/Pleura: The central airways are normal. No pneumothorax. Innumerable tiny pulmonary nodules seen in the lungs similar in the interval. Most of these mild nodules measure 2 or 3 mm. There is also nodularity along the pleural surfaces which is stable. The nodularity does not extend along the fissures and is not in a perivascular distribution. No mass or focal infiltrate.  Upper Abdomen: Numerous hepatic cysts with no suspicious hepatic masses. Evaluation of the upper abdomen is otherwise limited due to timing of contrast but no other abnormalities are seen. No enlarged lymph nodes are identified in the upper abdomen.  Musculoskeletal: No chest wall mass or suspicious bone lesions identified.  Review of the  MIP images confirms the above findings.  IMPRESSION: 1. The burden of pulmonary embolus in the right lower lobe has significantly diminished with only minimal linear thrombus remaining. The findings are consistent with chronic thrombus with no acute or new thrombus identified. 2. Persistent adenopathy in the mediastinum and hila correlates with the patient's history of non-Hodgkin's lymphoma. There has been no definitive interval change. 3. Persistent miliary pattern of nodularity in the lungs as well as numerous tiny nodules abutting the pleural surfaces. These findings are nonspecific. Sequela of malignancy including lymphoma could result in these findings. However, a variety of atypical infectious and inflammatory causes could present with a miliary pattern as well. Again, these findings are unchanged since June of 2017. Recommend clinical correlation and follow-up as clinically warranted. These results will be called to the ordering clinician or representative by the Radiologist Assistant, and communication documented in the PACS or zVision Dashboard.   Electronically Signed   By: Dorise Bullion III M.D   On: 07/20/2016 16:25    Right/Left Heart Cath and Coronary Angiography  Conclusion     The left ventricular systolic function is normal.  LV end diastolic pressure is normal.  The left ventricular ejection fraction is 55-65% by visual estimate.  There is moderate mitral valve prolapse.  LV end diastolic pressure is normal.  Normal right heart  pressures. CO 5.7 L/min. Cardiac index 2.8. PA sat 65%.  Tortuous abdominal aorta without aneurysm.  No significant coronary artery disease. 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   Restart Xarelto tomorrow.  Aspirin also continued per his home meds.  Followup with Dr. Oval Linsey.    Indications   Mitral valve insufficiency, unspecified etiology [I34.0 (ICD-10-CM)]  Procedural  Details/Technique   Technical Details The risks, benefits, and details of the procedure were explained to the patient. The patient verbalized understanding and wanted to proceed. Informed written consent was obtained.  PROCEDURE TECHNIQUE: After Xylocaine anesthesia, a 5 French sheath was placed in the right antecubital area in exchange for a peripheral IV. A 5 French balloontipped Swan-Ganz catheter was advanced to the pulmonary artery under fluoroscopic guidance. Hemodynamic pressures were obtained. Oxygen saturations were obtained. After Xylocaine anesthesia, a 46F sheath was placed in the right radial artery with a single anterior needle wall stick. Left coronary angiography was done using a Judkins L3.5 guide catheter. Right coronary angiography was done using a Judkins R4 guide catheter. Left heart cath/ventriculogram was done using a pigtail catheter and power injector. Abdominal aortogram was done using a pigtail catheter and power injector.    Contrast: 100 cc     Estimated blood loss <50 mL.  During this procedure the patient was administered the following to achieve and maintain moderate conscious sedation: Versed 2 mg, Fentanyl 50 mcg, while the patient's heart rate, blood pressure, and oxygen saturation were continuously monitored. The period of conscious sedation was 44 minutes, of which I was present face-to-face 100% of this time.    Complications   Complications documented before study signed (08/13/2016 8:58 AM EDT)    No complications were associated with this study.  Documented by [name], MD - 08/13/2016 8:46 AM EDT    Coronary Findings   Dominance: Right  Left Main  Vessel is large. Aneurysmal in the mid to distal vessel. There is a bend at the ostium. There is no pressure dampening.  Right Coronary Artery  Vessel is large.  Right Heart   Right Heart Pressures LV EDP is normal.    Right Atrium Right atrial pressure is normal.    Left Heart    Left Ventricle The left ventricular size is in the upper limits of normal. The left ventricular systolic function is normal. LV end diastolic pressure is normal. The left ventricular ejection fraction is 55-65% by visual estimate. No regional wall motion abnormalities.    Mitral Valve There is moderate mitral valve prolapse. significant mitral regurgitation noted.    Coronary Diagrams   Diagnostic Diagram     Implants     No implant documentation for this case.  PACS Images   Show images for Cardiac catheterization   Link to Procedure Log   Procedure Log    Hemo Data   Flowsheet Row Most Recent Value  Fick Cardiac Output 5.7 L/min  Fick Cardiac Output Index 2.81 (L/min)/BSA  RA A Wave 5 mmHg  RA V Wave 2 mmHg  RA Mean 1 mmHg  RV Systolic Pressure 29 mmHg  RV Diastolic Pressure -1 mmHg  RV EDP 4 mmHg  PA Systolic Pressure 24 mmHg  PA Diastolic Pressure 7 mmHg  PA Mean 15 mmHg  PW A Wave 6 mmHg  PW V Wave 6 mmHg  PW Mean 4 mmHg  AO Systolic Pressure 89 mmHg  AO Diastolic Pressure 65 mmHg  AO Mean  78 mmHg  LV Systolic Pressure [id number] mmHg  LV Diastolic Pressure 1 mmHg  LV EDP 9 mmHg  Arterial Occlusion Pressure Extended Systolic Pressure [id number] mmHg  Arterial Occlusion Pressure Extended Diastolic Pressure 76 mmHg  Arterial Occlusion Pressure Extended Mean Pressure 92 mmHg  Left Ventricular Apex Extended Systolic Pressure 123XX123 mmHg  Left Ventricular Apex Extended Diastolic Pressure 4 mmHg  Left Ventricular Apex Extended EDP Pressure 10 mmHg  QP/QS 1  TPVR Index 5.34 HRUI  TSVR Index 27.76 HRUI  PVR SVR Ratio 0.14  TPVR/TSVR Ratio 0.19      Impression:  Patient has at least stage C and possibly early stage D severe primary mitral regurgitation. I have previoiusly reviewed the transesophageal echocardiogram performed recently by Dr. Oval Linsey. There is mild bileaflet prolapse with severe prolapse involving the posterior leaflet. I do not see any clear evidence for  ruptured chordae tendineae and the jet of regurgitation is both eccentric and bidirectional.  The severity of regurgitation meets criteria for being severe with regurgitant volume estimated 48 mL per beat.  Left ventricular size and systolic function remains normal. There is no significant leaflet or subvalvular calcification. Anatomical characteristics appear favorable for valve repair.  Options include continued medical therapy with close follow-up versus proceeding with elective valve repair.  Risks associated with surgery should be very low. The patient appears to be a good candidate for minimally invasive approach for surgery.    Plan:  The patient is interested in proceeding with surgery soon after Thanksgiving. We tented we plan to proceed with minimally invasive mitral valve repair on Wednesday, 10/06/2016. The patient will return to our office for follow-up on Monday, 09/27/2016.  At that time we will make final plans with regards to stopping his anticoagulation using Xarelto.  All of his questions have been addressed.  I spent in excess of 15 minutes during the conduct of this office consultation and >50% of this time involved direct face-to-face encounter with the patient for counseling and/or coordination of their care.   Valentina Gu. Roxy Manns, MD 08/23/2016 1:21 PM

## 2016-08-23 NOTE — Patient Instructions (Signed)
Continue all previous medications without any changes at this time  

## 2016-08-25 ENCOUNTER — Telehealth: Payer: Self-pay | Admitting: Cardiovascular Disease

## 2016-08-25 NOTE — Telephone Encounter (Signed)
Pt wants Dr Oval Linsey to extend his FMLA ,pt having surgery on 10-06-16. He needs it extended to the end of January please.

## 2016-08-25 NOTE — Telephone Encounter (Signed)
Will forward to Dr [place] for review  

## 2016-08-26 NOTE — Telephone Encounter (Signed)
No problem.  This is very reasonable.

## 2016-08-27 NOTE — Telephone Encounter (Signed)
Advised patient and he will call his benefits department to find out what needs to submit

## 2016-09-08 ENCOUNTER — Encounter: Payer: Self-pay | Admitting: *Deleted

## 2016-09-20 NOTE — Telephone Encounter (Signed)
Patient seen 07-27-2016

## 2016-09-24 ENCOUNTER — Telehealth: Payer: Self-pay | Admitting: Family Medicine

## 2016-09-24 NOTE — Telephone Encounter (Signed)
Pt called and is requesting something for pain, states he normally takes xarelto for his arthritis, states he is in a lot of pain, esp in his feet, informed him you may want to see him for this but he wanted me to send you a message first, pt uses [place], [place] - [address] and pt can be reached at [phone]

## 2016-09-24 NOTE — Telephone Encounter (Signed)
Since he is on Xarelto he can mainly use Tylenol. If this doesn't work, he will need an appointment

## 2016-09-24 NOTE — Telephone Encounter (Signed)
Pt informed and verbalized understanding

## 2016-09-27 ENCOUNTER — Encounter: Payer: No Typology Code available for payment source | Admitting: Thoracic Surgery (Cardiothoracic Vascular Surgery)

## 2016-10-04 ENCOUNTER — Other Ambulatory Visit (HOSPITAL_COMMUNITY): Payer: No Typology Code available for payment source

## 2016-10-04 ENCOUNTER — Encounter (HOSPITAL_COMMUNITY): Payer: No Typology Code available for payment source

## 2016-10-18 ENCOUNTER — Other Ambulatory Visit: Payer: Self-pay | Admitting: *Deleted

## 2016-10-18 ENCOUNTER — Encounter: Payer: Self-pay | Admitting: Thoracic Surgery (Cardiothoracic Vascular Surgery)

## 2016-10-18 ENCOUNTER — Ambulatory Visit (INDEPENDENT_AMBULATORY_CARE_PROVIDER_SITE_OTHER): Payer: No Typology Code available for payment source | Admitting: Thoracic Surgery (Cardiothoracic Vascular Surgery)

## 2016-10-18 VITALS — BP 130/88 | HR 123 | Resp 16 | Ht 69.0 in | Wt 195.0 lb

## 2016-10-18 DIAGNOSIS — I340 Nonrheumatic mitral (valve) insufficiency: Secondary | ICD-10-CM

## 2016-10-18 DIAGNOSIS — I341 Nonrheumatic mitral (valve) prolapse: Secondary | ICD-10-CM

## 2016-10-18 MED ORDER — ENOXAPARIN SODIUM 150 MG/ML ~~LOC~~ SOLN: 1.0000 mg/kg | SUBCUTANEOUS | Status: DC

## 2016-10-18 MED ORDER — ENOXAPARIN (LOVENOX) PATIENT EDUCATION KIT: PACK | Freq: Once | Status: DC

## 2016-10-18 NOTE — Patient Instructions (Signed)
Resume taking Toprol as previously prescribed  Stop taking Xarelto after you take your dose on Wednesday 12/13  Begin Lovenox injections on Friday 12/15 and take them daily through Monday 12/18.  Do not take Lovenox on Tuesday 12/19   Continue taking all other medications without change through the [facility] before surgery.  Have nothing to eat or drink after midnight the night before surgery.  On the morning of surgery take only Toprol with a sip of water.

## 2016-10-18 NOTE — Progress Notes (Signed)
[address]       [place] [address]             [phone]     CARDIOTHORACIC SURGERY OFFICE NOTE  Referring Provider is [name], MD PCP is Wyatt Haste, MD   HPI:  Patient returns to the office today for follow-up of mitral valve prolapse with severe mitral regurgitation. He was originally seen in consultation on 02/05/2016 at which time he remained asymptomatic. More recently he was seen in follow-up on 08/23/2016.  At that time he reported a significant decrease in his exercise tolerance with a tendency to get short of breath with more strenuous physical exertion. He previously had problems with palpitations, but that had improved on low-dose beta blocker therapy. After our last office visit the patient made a decision to proceed with elective mitral valve repair over the Christmas holiday. He returns to the office today with tentative plans to proceed with surgery next week. He states that over the last few weeks he has not been taking his beta blocker, and he has had increased palpitations. He denies any resting shortness of breath or chest discomfort. He denies any fevers, chills, or productive cough. The remainder of his review of systems is unchanged from previously.   Current Outpatient Prescriptions  Medication Sig Dispense Refill  . acetaminophen (TYLENOL) 325 MG tablet Take 650 mg by mouth every 6 (six) hours as needed for mild pain.    Marland Kitchen aspirin EC 81 MG tablet Take 81 mg by mouth daily.    . rivaroxaban (XARELTO) 20 MG TABS tablet Take 1 tablet (20 mg total) by mouth daily with supper. 30 tablet 5  . metoprolol succinate (TOPROL XL) 25 MG 24 hr tablet 1/2 tablet by mouth daily (Patient not taking: Reported on 10/18/2016) 15 tablet 3   No current facility-administered medications for this visit.       Physical Exam:   BP 130/88 (BP Location: Right Arm, Patient Position: Sitting, Cuff Size: Large)   Pulse (!) 123   Resp 16    Ht 5\' 9"  (1.753 m)   Wt 195 lb (88.5 kg)   SpO2 99% Comment: RA  BMI 28.80 kg/m   General:  Well-appearing  Chest:   Clear to auscultation  CV:   Tachycardic with holosystolic murmur  Incisions:  n/a  Abdomen:  Soft and nontender  Extremities:  Warm and well-perfused  Diagnostic Tests:  2 channel telemetry rhythm strip demonstrates sinus tachycardia   Impression:  Patient has at least stage C and possibly early stage D severe primary mitral regurgitation. I have previoiusly reviewed the transesophageal echocardiogram performed recently by Dr. Oval Linsey. There is mild bileaflet prolapse with severe prolapse involving the posterior leaflet. I do not see any clear evidence for ruptured chordae tendineae and the jet of regurgitation is both eccentric and bidirectional. The severity of regurgitation meets criteria for being severe with regurgitant volume estimated 48 mL per beat. Left ventricular size and systolic function remains normal. There is no significant leaflet or subvalvular calcification. Anatomical characteristics appear favorable for valve repair. Options include continued medical therapy with close follow-up versus proceeding with elective valve repair.  Risks associated with surgery should be very low. The patient appears to be a good candidate for minimally invasive approach for surgery.  Symptoms of tachycardia and palpitations have recurred since the patient stop taking his beta blocker recently.  Plan:  The patient was again counseled at length regarding the indications,  risks and potential benefits of mitral valve repair.  The rationale for elective surgery has been explained, including a comparison between surgery and continued medical therapy with close follow-up.  The likelihood of successful and durable valve repair has been discussed with particular reference to the findings of their recent echocardiogram.  Based upon these findings and previous experience, I have quoted  them a greater than 95 percent likelihood of successful valve repair.  In the unlikely event that their valve cannot be successfully repaired, we discussed the possibility of replacing the mitral valve using a mechanical prosthesis with the attendant need for long-term anticoagulation versus the alternative of replacing it using a bioprosthetic tissue valve with its potential for late structural valve deterioration and failure, depending upon the patient's longevity.  The patient specifically requests that if the mitral valve must be replaced that it be done using a mechanical valve.   The patient understands and accepts all potential risks of surgery including but not limited to risk of death, stroke or other neurologic complication, myocardial infarction, congestive heart failure, respiratory failure, renal failure, bleeding requiring transfusion and/or reexploration, arrhythmia, infection or other wound complications, pneumonia, pleural and/or pericardial effusion, pulmonary embolus, aortic dissection or other major vascular complication, or delayed complications related to valve repair or replacement including but not limited to structural valve deterioration and failure, thrombosis, embolization, endocarditis, or paravalvular leak.  Alternative surgical approaches have been discussed including a comparison between conventional sternotomy and minimally-invasive techniques.  The relative risks and benefits of each have been reviewed as they pertain to the patient's specific circumstances, and all of their questions have been addressed.  Specific risks potentially related to the minimally-invasive approach were discussed at length, including but not limited to risk of conversion to full or partial sternotomy, aortic dissection or other major vascular complication, unilateral acute lung injury or pulmonary edema, phrenic nerve dysfunction or paralysis, rib fracture, chronic pain, lung hernia, or lymphocele.   Expectations for his postoperative convalescence have been discussed. All of his questions have been answered.  We plan to proceed with surgery on Wednesday, 10/27/2016. The patient has been instructed to stop taking Xarelto after he takes his dose on Wednesday, 10/20/2016. He has been given a prescription for Lovenox injections for bridging therapy. The patient has also been instructed to resume taking Toprol-XL as previously prescribed.   I spent in excess of 30 minutes during the conduct of this office consultation and >50% of this time involved direct face-to-face encounter with the patient for counseling and/or coordination of their care.   Valentina Gu. Roxy Manns, MD 10/18/2016 4:06 PM

## 2016-10-20 ENCOUNTER — Other Ambulatory Visit: Payer: Self-pay | Admitting: *Deleted

## 2016-10-20 DIAGNOSIS — Z7901 Long term (current) use of anticoagulants: Secondary | ICD-10-CM

## 2016-10-20 MED ORDER — ENOXAPARIN SODIUM 150 MG/ML ~~LOC~~ SOLN: 130.0000 mg | SUBCUTANEOUS | 0 refills | Status: DC

## 2016-10-20 MED ORDER — ENOXAPARIN (LOVENOX) PATIENT EDUCATION KIT: 1.0000 | PACK | Freq: Once | 0 refills | Status: AC

## 2016-10-25 ENCOUNTER — Ambulatory Visit (HOSPITAL_COMMUNITY)
Admission: RE | Admit: 2016-10-25 | Discharge: 2016-10-25 | Disposition: A | Discharge status: Home / Self Care | Payer: No Typology Code available for payment source | Source: Ambulatory Visit | Attending: Thoracic Surgery (Cardiothoracic Vascular Surgery) | Admitting: Thoracic Surgery (Cardiothoracic Vascular Surgery)

## 2016-10-25 ENCOUNTER — Ambulatory Visit (HOSPITAL_BASED_OUTPATIENT_CLINIC_OR_DEPARTMENT_OTHER)
Admission: RE | Admit: 2016-10-25 | Discharge: 2016-10-25 | Disposition: A | Discharge status: Home / Self Care | Payer: No Typology Code available for payment source | Source: Ambulatory Visit | Attending: Thoracic Surgery (Cardiothoracic Vascular Surgery) | Admitting: Thoracic Surgery (Cardiothoracic Vascular Surgery)

## 2016-10-25 ENCOUNTER — Encounter (HOSPITAL_COMMUNITY)
Admission: RE | Admit: 2016-10-25 | Discharge: 2016-10-25 | Disposition: A | Discharge status: Home / Self Care | Payer: No Typology Code available for payment source | Source: Ambulatory Visit | Attending: Thoracic Surgery (Cardiothoracic Vascular Surgery) | Admitting: Thoracic Surgery (Cardiothoracic Vascular Surgery)

## 2016-10-25 ENCOUNTER — Encounter (HOSPITAL_COMMUNITY): Payer: Self-pay

## 2016-10-25 DIAGNOSIS — Z01818 Encounter for other preprocedural examination: Secondary | ICD-10-CM | POA: Insufficient documentation

## 2016-10-25 DIAGNOSIS — Z01812 Encounter for preprocedural laboratory examination: Secondary | ICD-10-CM

## 2016-10-25 DIAGNOSIS — Z01810 Encounter for preprocedural cardiovascular examination: Secondary | ICD-10-CM | POA: Insufficient documentation

## 2016-10-25 DIAGNOSIS — R9431 Abnormal electrocardiogram [ECG] [EKG]: Secondary | ICD-10-CM | POA: Insufficient documentation

## 2016-10-25 DIAGNOSIS — I340 Nonrheumatic mitral (valve) insufficiency: Secondary | ICD-10-CM

## 2016-10-25 HISTORY — DX: Dyspnea, unspecified: R06.00

## 2016-10-25 HISTORY — DX: Tremor, unspecified: R25.1

## 2016-10-25 HISTORY — DX: Unspecified osteoarthritis, unspecified site: M19.90

## 2016-10-25 HISTORY — DX: Anxiety disorder, unspecified: F41.9

## 2016-10-25 LAB — VAS US DOPPLER PRE CABG
LEFT ECA DIAS: -11 cm/s
LEFT VERTEBRAL DIAS: 11 cm/s
Left CCA dist dias: -21 cm/s
Left CCA dist sys: -69 cm/s
Left CCA prox dias: 9 cm/s
Left CCA prox sys: 67 cm/s
Left ICA dist dias: -17 cm/s
Left ICA dist sys: -55 cm/s
Left ICA prox dias: -28 cm/s
Left ICA prox sys: -60 cm/s
RIGHT ECA DIAS: -9 cm/s
RIGHT VERTEBRAL DIAS: -12 cm/s
Right CCA prox dias: 12 cm/s
Right CCA prox sys: 61 cm/s
Right cca dist sys: -53 cm/s

## 2016-10-25 LAB — PULMONARY FUNCTION TEST
DL/VA % pred: 85 %
DL/VA: 3.9 ml/min/mmHg/L
DLCO unc % pred: 73 %
DLCO unc: 22.65 ml/min/mmHg
FEF 25-75 Post: 2.81 L/sec
FEF 25-75 Pre: 2.14 L/sec
FEF2575-%Change-Post: 31 %
FEF2575-%Pred-Post: 95 %
FEF2575-%Pred-Pre: 72 %
FEV1-%Change-Post: 8 %
FEV1-%Pred-Post: 101 %
FEV1-%Pred-Pre: 93 %
FEV1-Post: 3.14 L
FEV1-Pre: 2.89 L
FEV1FVC-%Change-Post: 4 %
FEV1FVC-%Pred-Pre: 92 %
FEV6-%Change-Post: 4 %
FEV6-%Pred-Post: 107 %
FEV6-%Pred-Pre: 102 %
FEV6-Post: 4.08 L
FEV6-Pre: 3.9 L
FEV6FVC-%Change-Post: 0 %
FEV6FVC-%Pred-Post: 102 %
FEV6FVC-%Pred-Pre: 101 %
FVC-%Change-Post: 4 %
FVC-%Pred-Post: 104 %
FVC-%Pred-Pre: 100 %
FVC-Post: 4.11 L
FVC-Pre: 3.94 L
Post FEV1/FVC ratio: 76 %
Post FEV6/FVC ratio: 99 %
Pre FEV1/FVC ratio: 73 %
Pre FEV6/FVC Ratio: 99 %

## 2016-10-25 LAB — BLOOD GAS, ARTERIAL
Acid-base deficit: 0.8 mmol/L (ref 0.0–2.0)
Bicarbonate: 22.7 mmol/L (ref 20.0–28.0)
Drawn by: 449841
FIO2: 21
O2 Saturation: 95.9 %
Patient temperature: 98.6
pCO2 arterial: 33.5 mmHg (ref 32.0–48.0)
pH, Arterial: 7.445 (ref 7.350–7.450)
pO2, Arterial: 77.7 mmHg — ABNORMAL LOW (ref 83.0–108.0)

## 2016-10-25 LAB — URINALYSIS, ROUTINE W REFLEX MICROSCOPIC
Bilirubin Urine: NEGATIVE
Glucose, UA: NEGATIVE mg/dL
Hgb urine dipstick: NEGATIVE
Ketones, ur: 20 mg/dL — AB
Leukocytes, UA: NEGATIVE
Nitrite: NEGATIVE
Protein, ur: 30 mg/dL — AB
Specific Gravity, Urine: 1.025 (ref 1.005–1.030)
pH: 5 (ref 5.0–8.0)

## 2016-10-25 LAB — COMPREHENSIVE METABOLIC PANEL
ALT: 58 U/L (ref 17–63)
AST: 64 U/L — ABNORMAL HIGH (ref 15–41)
Albumin: 4 g/dL (ref 3.5–5.0)
Alkaline Phosphatase: 36 U/L — ABNORMAL LOW (ref 38–126)
Anion gap: 12 (ref 5–15)
BUN: 9 mg/dL (ref 6–20)
CO2: 23 mmol/L (ref 22–32)
Calcium: 9.3 mg/dL (ref 8.9–10.3)
Chloride: 106 mmol/L (ref 101–111)
Creatinine, Ser: 0.98 mg/dL (ref 0.61–1.24)
GFR calc Af Amer: >60 mL/min (ref >60)
GFR calc non Af Amer: >60 mL/min (ref >60)
Glucose, Bld: 95 mg/dL (ref 65–99)
Potassium: 3.7 mmol/L (ref 3.5–5.1)
Sodium: 141 mmol/L (ref 135–145)
Total Bilirubin: 1.4 mg/dL — ABNORMAL HIGH (ref 0.3–1.2)
Total Protein: 6.6 g/dL (ref 6.5–8.1)

## 2016-10-25 LAB — CBC
HCT: 35.7 % — ABNORMAL LOW (ref 39.0–52.0)
Hemoglobin: 11.4 g/dL — ABNORMAL LOW (ref 13.0–17.0)
MCH: 26.5 pg (ref 26.0–34.0)
MCHC: 31.9 g/dL (ref 30.0–36.0)
MCV: 83 fL (ref 78.0–100.0)
Platelets: 180 10*3/uL (ref 150–400)
RBC: 4.3 MIL/uL (ref 4.22–5.81)
RDW: 16 % — ABNORMAL HIGH (ref 11.5–15.5)
WBC: 3.1 10*3/uL — ABNORMAL LOW (ref 4.0–10.5)

## 2016-10-25 LAB — ABO/RH: ABO/RH(D): A POS

## 2016-10-25 LAB — APTT: aPTT: 34 seconds (ref 24–36)

## 2016-10-25 LAB — PROTIME-INR
INR: 1.08
Prothrombin Time: 14.1 seconds (ref 11.4–15.2)

## 2016-10-25 LAB — SURGICAL PCR SCREEN
MRSA, PCR: NEGATIVE
Staphylococcus aureus: NEGATIVE

## 2016-10-25 MED ORDER — ALBUTEROL SULFATE (2.5 MG/3ML) 0.083% IN NEBU
2.5000 mg | INHALATION_SOLUTION | Freq: Once | RESPIRATORY_TRACT | Status: AC
Start: 2016-10-25 — End: 2016-10-25
  Administered 2016-10-25: 2.5 mg via RESPIRATORY_TRACT

## 2016-10-25 NOTE — Progress Notes (Signed)
Pre-op Cardiac Surgery  Carotid Findings:  No evidence if extracranial ICA stenosis. Vertebral artery flow is antegrade.  Upper Extremity Right Left  Brachial Pressures    Radial Waveforms    Ulnar Waveforms    Palmar Arch (Allen's Test)     Findings:  Doppler waveforms remained normal bilaterally with both radial and ulnar compressions.   [facility], [place] 10/25/2016 11:40 AM

## 2016-10-25 NOTE — Pre-Procedure Instructions (Signed)
    Leonard Bentley  10/25/2016      [facility] [place], [place] - [name] [place] [address] Phone: [phone] Fax: [phone]    Your procedure is scheduled on 10/27/16.  Report to [facility] Admitting at 630 A.M.  Call this number if you have problems the morning of surgery:  [phone]   Remember:  Do not eat food or drink liquids after midnight.  Take these medicines the morning of surgery with A SIP OF WATER --tylenol,metoprolol   Do not wear jewelry, make-up or nail polish.  Do not wear lotions, powders, or perfumes, or deoderant.  Do not shave 48 hours prior to surgery.  Men may shave face and neck.  Do not bring valuables to the hospital.  [facility] is not responsible for any belongings or valuables.  Contacts, dentures or bridgework may not be worn into surgery.  Leave your suitcase in the car.  After surgery it may be brought to your room.  For patients admitted to the hospital, discharge time will be determined by your treatment team.  Patients discharged the [facility] of surgery will not be allowed to drive home.   Name and phone number of your driver:   Special instructions:   Please read over the following fact sheets that you were given. MRSA Information

## 2016-10-26 LAB — HEMOGLOBIN A1C
Hgb A1c MFr Bld: 5.2 % (ref 4.8–5.6)
Mean Plasma Glucose: 103 mg/dL

## 2016-10-26 MED ORDER — PHENYLEPHRINE HCL 10 MG/ML IJ SOLN
30.0000 ug/min | INTRAMUSCULAR | Status: DC
  Filled 2016-10-26 (×2): qty 2

## 2016-10-26 MED ORDER — TRANEXAMIC ACID (OHS) BOLUS VIA INFUSION
15.0000 mg/kg | INTRAVENOUS | Status: AC
  Administered 2016-10-27: 1261.5 mg via INTRAVENOUS
  Filled 2016-10-26 (×2): qty 1262

## 2016-10-26 MED ORDER — NITROGLYCERIN IN D5W 200-5 MCG/ML-% IV SOLN
2.0000 ug/min | INTRAVENOUS | Status: DC
  Filled 2016-10-26 (×2): qty 250

## 2016-10-26 MED ORDER — VANCOMYCIN HCL 1000 MG IV SOLR
INTRAVENOUS | Status: AC
  Administered 2016-10-27: 1000 mL
  Filled 2016-10-26 (×2): qty 1000

## 2016-10-26 MED ORDER — POTASSIUM CHLORIDE 2 MEQ/ML IV SOLN
80.0000 meq | INTRAVENOUS | Status: DC
  Filled 2016-10-26 (×2): qty 40

## 2016-10-26 MED ORDER — TRANEXAMIC ACID 1000 MG/10ML IV SOLN
1.5000 mg/kg/h | INTRAVENOUS | Status: AC
  Administered 2016-10-27: 1.5 mg/kg/h via INTRAVENOUS
  Filled 2016-10-26 (×2): qty 25

## 2016-10-26 MED ORDER — DEXTROSE 5 % IV SOLN
1.5000 g | INTRAVENOUS | Status: AC
  Administered 2016-10-27: 1.5 g via INTRAVENOUS
  Administered 2016-10-27: .75 g via INTRAVENOUS
  Filled 2016-10-26 (×2): qty 1.5

## 2016-10-26 MED ORDER — DEXMEDETOMIDINE HCL IN NACL 400 MCG/100ML IV SOLN
0.1000 ug/kg/h | INTRAVENOUS | Status: AC
  Administered 2016-10-27: .3 ug/kg/h via INTRAVENOUS
  Filled 2016-10-26 (×2): qty 100

## 2016-10-26 MED ORDER — GLUTARALDEHYDE 0.625% SOAKING SOLUTION
TOPICAL | Status: DC | PRN
  Filled 2016-10-26: qty 50

## 2016-10-26 MED ORDER — DEXTROSE 5 % IV SOLN
750.0000 mg | INTRAVENOUS | Status: DC
  Filled 2016-10-26 (×2): qty 750

## 2016-10-26 MED ORDER — VANCOMYCIN HCL 10 G IV SOLR
1250.0000 mg | INTRAVENOUS | Status: DC
  Filled 2016-10-26 (×3): qty 1250

## 2016-10-26 MED ORDER — DOPAMINE-DEXTROSE 3.2-5 MG/ML-% IV SOLN
0.0000 ug/kg/min | INTRAVENOUS | Status: DC
  Filled 2016-10-26 (×2): qty 250

## 2016-10-26 MED ORDER — SODIUM CHLORIDE 0.9 % IV SOLN
INTRAVENOUS | Status: DC
  Filled 2016-10-26 (×2): qty 2.5

## 2016-10-26 MED ORDER — MAGNESIUM SULFATE 50 % IJ SOLN
40.0000 meq | INTRAMUSCULAR | Status: DC
  Filled 2016-10-26 (×2): qty 10

## 2016-10-26 MED ORDER — TRANEXAMIC ACID (OHS) PUMP PRIME SOLUTION
2.0000 mg/kg | INTRAVENOUS | Status: DC
  Filled 2016-10-26 (×2): qty 1.68

## 2016-10-26 MED ORDER — EPINEPHRINE PF 1 MG/ML IJ SOLN
0.0000 ug/min | INTRAMUSCULAR | Status: DC
  Filled 2016-10-26 (×2): qty 4

## 2016-10-26 MED ORDER — PLASMA-LYTE 148 IV SOLN
INTRAVENOUS | Status: DC
  Filled 2016-10-26 (×2): qty 2.5

## 2016-10-26 MED ORDER — SODIUM CHLORIDE 0.9 % IV SOLN
INTRAVENOUS | Status: DC
  Filled 2016-10-26 (×2): qty 30

## 2016-10-26 NOTE — H&P (Signed)
[address]       [place] [address]             [phone]          CARDIOTHORACIC SURGERY HISTORY AND PHYSICAL EXAM  Referring Provider is [name], MD PCP is Wyatt Haste, MD   HPI:  Patient is a 55 year old male with history of mitral valve prolapse and severe mitral regurgitation, stage II large B cell non-Hodgkin's lymphoma diagnosed in 2012 treated with systemic chemotherapy, recurrent pulmonary embolism on long-term anticoagulation, and Mobitz type I second-degree AV block with frequent non-conducted PAC's associated with symptoms of palpitations who returns to the office today for follow-up of severe mitral regurgitation. He was originally seen in consultation on 02/05/2016 after he had undergone transesophageal echocardiogram demonstrating the presence of mitral valve prolapse with severe mitral regurgitation. He underwent CT angiogram of the chest, abdomen, and pelvis as part of his routine preoperative workup for possible elective namely invasive mitral valve repair. He was found to have a recurrent pulmonary embolus and was placed back on anticoagulation using Xarelto, and plans for elective mitral valve repair were postponed.  He was seen in follow-up recently by Dr. Oval Linsey and reportedly doing quite well, although the patient does complain of mild exertional shortness of breath associated with more strenuous exertion. The patient exercises routinely and has noticed a significant change in his exercise tolerance. Palpitations have been much improved on beta blocker therapy.  Follow-up CT angiogram of the chest revealed resolution of the pulmonary embolus with only minimal residual linear atelectasis and clot at the right lung base. The patient is now interested in proceeding with elective mitral valve repair and he recently underwent routine left and right heart catheterization. He was found to have no significant coronary artery disease  and normal pulmonary artery pressures.  The patient has also been seen recently by Dr. Inda Merlin follows him because of his history of non-Hodgkin's lymphoma. The patient was noted to have stable leukocytopenia, neutropenia, and anemia that were felt to be secondary to his treatment with diclofenac and previous chemotherapy. The patient returns to our office and reports that he feels well. He exercises routinely on a regular basis. He states that he gets short of breath with strenuous activity only. He denies any resting shortness of breath, PND, orthopnea, or lower extremity edema. He has not been having palpitations recently. He does report occasional cramps in his hands and legs that do not occur with activity but typically occur only at rest.  The remainder of the patient's review of systems is unremarkable in the remainder of his past medical history is unchanged from previously.  Patient returns to the office today for follow-up of mitral valve prolapse with severe mitral regurgitation. He was originally seen in consultation on 02/05/2016 at which time he remained asymptomatic. More recently he was seen in follow-up on 08/23/2016.  At that time he reported a significant decrease in his exercise tolerance with a tendency to get short of breath with more strenuous physical exertion. He previously had problems with palpitations, but that had improved on low-dose beta blocker therapy. After our last office visit the patient made a decision to proceed with elective mitral valve repair over the Christmas holiday. He returns to the office today with tentative plans to proceed with surgery next week. He states that over the last few weeks he has not been taking his beta blocker, and he has had increased palpitations. He denies  any resting shortness of breath or chest discomfort. He denies any fevers, chills, or productive cough. The remainder of his review of systems is unchanged from previously.  The patient is  single and lives locally by himself in [place]. He has 1 adult daughter who lives in [place]. He works full-time doing Dealer at [employer]. He has remained physically active all of his adult life.  He enjoys running and typically runs at least 3 or 4 times every week. He reports no significant physical limitations up until the past year. He has mild arthritis in his back that does not limit his activity to any significant degree. 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 He states that he can still go running and does not typically get short of breath with running. However, if he walks up 3 flights of stairs he will be out of breath. He has never had any resting shortness of breath, PND, orthopnea, or lower extremity edema. He has not had any chest pain or chest tightness either with activity or at rest.  He still has palpitations although the frequency of palpitations has decreased since he has been taking metoprolol. He has had some dizzy spells without syncope.    Past Medical History:  Diagnosis Date  . Acute pulmonary embolism ([facility]) 04/11/2016  . Anxiety   . Arthritis   . Atrial tachycardia ([facility]) 01/06/2016  . Cancer ([facility]) 07/2011   STAGE II NON- HODGKIN LYMPHOMA  . Depression   . Dyspnea   . ED (erectile dysfunction)   . Lymphoma, non Hodgkin's 09/25/2011  . Mitral regurgitation 01/20/2016  . Mobitz I 10/09/2015  . Murmur 01/06/2016  . Occasional tremors   . PAC (premature atrial contraction) 10/09/2015  . Pulmonary emboli [facility])     Past Surgical History:  Procedure Laterality Date  . CARDIAC CATHETERIZATION N/A 08/13/2016   Procedure: Right/Left Heart Cath and Coronary Angiography;  Surgeon: [name], MD;  Location: [facility];  Service: Cardiovascular;  Laterality: N/A;  . NO PAST SURGERIES    . TEE WITHOUT CARDIOVERSION N/A 02/11/2016   Procedure: TRANSESOPHAGEAL ECHOCARDIOGRAM (TEE);  Surgeon:  [name], MD;  Location: [facility] ENDOSCOPY;  Service: Cardiovascular;  Laterality: N/A;  . TRIGGER FINGER RELEASE Bilateral     Family History  Problem Relation Age of Onset  . Cancer Mother     BREAST(BONE)  . Cancer Father     PANCREATIC  . Hypertension Maternal Grandmother   . Stroke Maternal Aunt   . Heart attack Neg Hx     Social History Social History  Substance Use Topics  . Smoking status: Former Smoker    Packs/Cecere: 1.00    Years: 32.00    Types: Cigarettes, E-cigarettes    Quit date: 07/06/2013  . Smokeless tobacco: Current User     Comment: smokes vapor cig. 3 times [place]  . Alcohol use 0.0 oz/week     Comment:  1 bottle of wine    Prior to Admission medications   Medication Sig Start Date End Date Taking? Authorizing Provider  acetaminophen (TYLENOL) 500 MG tablet Take 1,000 mg by mouth every 6 (six) hours as needed (for arthritis pain.).   Yes Historical Provider, MD  fluticasone (FLONASE) 50 MCG/ACT nasal spray Place 1 spray into both nostrils 2 (two) times daily as needed (for nasal symptoms.).   Yes Historical Provider, MD  metoprolol succinate (TOPROL XL) 25 MG 24 hr tablet 1/2 tablet  by mouth daily Patient taking differently: Take 12.5 mg by mouth 2 (two) times daily.  05/03/16  Yes [name], MD  rivaroxaban (XARELTO) 20 MG TABS tablet Take 1 tablet (20 mg total) by mouth daily with supper. 08/14/16  Yes [name], MD  enoxaparin (LOVENOX) 150 MG/ML injection Inject 0.87 mLs (130 mg total) into the skin daily. 10/22/16 10/26/16  [name], MD    No Known Allergies  Review of Systems:              General:                      normal appetite, decreased energy, no weight gain, no weight loss, no fever             Cardiac:                       no chest pain with exertion, no chest pain at rest, + SOB with exertion, no resting SOB, no PND, no orthopnea, + palpitations, + arrhythmia, no atrial fibrillation, no LE edema, + dizzy spells,  no syncope             Respiratory:                 + slight exertional shortness of breath, no home oxygen, no productive cough, no dry cough, no bronchitis, no wheezing, no hemoptysis, no asthma, no pain with inspiration or cough, no sleep apnea, no CPAP at night             GI:                               no difficulty swallowing, no reflux, no frequent heartburn, no hiatal hernia, no abdominal pain, no constipation, no diarrhea, no hematochezia, no hematemesis, no melena             GU:                              no dysuria,  no frequency, no urinary tract infection, no hematuria, no enlarged prostate, no kidney stones, no kidney disease             Vascular:                     no pain suggestive of claudication, + pain in feet, + occasional leg cramps, no varicose veins, no DVT, no non-healing foot ulcer             Neuro:                         no stroke, no TIA's, no seizures, no headaches, no temporary blindness one eye,  no slurred speech, + peripheral neuropathy, no chronic pain, no instability of gait, no memory/cognitive dysfunction             Musculoskeletal:         + mild arthritis, no joint swelling, no myalgias, no difficulty walking, normal mobility              Skin:                            no rash, no itching, no skin infections, no pressure sores  or ulcerations             Psych:                         + anxiety, no depression, + nervousness, + unusual recent stress             Eyes:                           + blurry vision, + floaters, no recent vision changes, + wears glasses or contacts             ENT:                            no hearing loss, no loose or painful teeth, no dentures, last saw dentist December 2016             Hematologic:               no easy bruising, no abnormal bleeding, no clotting disorder, no frequent epistaxis             Endocrine:                   no diabetes, does not check CBG's at home                           Physical Exam:               BP 130/84 mmHg  Pulse 72  Resp 16  Ht 5\' 9"  (1.753 m)  Wt 187 lb (84.823 kg)  BMI 27.60 kg/m2  SpO2 98%             General:                        well-appearing             HEENT:                       Unremarkable              Neck:                           no JVD, no bruits, no adenopathy              Chest:                          clear to auscultation, symmetrical breath sounds, no wheezes, no rhonchi              CV:                              RRR, blowing IV/VI holosystolic murmur              Abdomen:                    soft, non-tender, no masses              Extremities:                 warm, well-perfused, pulses palpable, no LE edema  Rectal/GU                   Deferred             Neuro:                         Grossly non-focal and symmetrical throughout             Skin:                            Clean and dry, no rashes, no breakdown   Diagnostic Tests:  Transthoracic Echocardiography  Patient: Kevins, Jafri MR #: [id number] Study Date: 01/20/2016 Gender: M Age: 79 Height: 172.7 cm Weight: 84.4 kg BSA: 2.03 m^2 Pt. Status: Room:  SONOGRAPHER [place], Will ATTENDING Lyman Bishop MD PERFORMING [facility] Outpatient ORDERING [name], MD [place], MD  cc:  ------------------------------------------------------------------- LV EF: 60% - 65%  ------------------------------------------------------------------- Indications: (R06.00).  ------------------------------------------------------------------- History: PMH: Acquired from the patient and from the patient&'s chart. Dyspnea and murmur. Risk factors: Current tobacco use.  ------------------------------------------------------------------- Study Conclusions  - Left ventricle: The cavity size was normal. Wall thickness was  increased in a pattern of moderate LVH. Systolic  function was  normal. The estimated ejection fraction was in the range of 60%  to 65%. Wall motion was normal; there were no regional wall  motion abnormalities. The study is not technically sufficient to  allow evaluation of LV diastolic function. - Aortic valve: Sclerosis without stenosis. There was trivial  regurgitation. - Mitral valve: Mildly thickened leaflets with predominate prolapse  of the posterior leaflet and anteriorly directed mitral  regurgitation. There was moderate to severe eccentric  regurgitation directed at the atrial septum. - Left atrium: Massively dilated at 85 ml/m2. - Tricuspid valve: There was mild regurgitation. - Pulmonary arteries: PA peak pressure: 27 mm Hg (S). - Inferior vena cava: The vessel was normal in size. The  respirophasic diameter changes were in the normal range (>= 50%),  consistent with normal central venous pressure.  Impressions:  - LVEF 60-65%, moderate LVH, trivial AI, moderate to more likely  severe eccentric MR directed anteriorly and to the interatrial  septum, posterior mitral valve leaflet prolpase, massive LAE,  mild TR, RVSP 27 mmHg, normal IVC.  Transthoracic echocardiography. M-mode, complete 2D, spectral Doppler, and color Doppler. Birthdate: Patient birthdate: July 05, 1961. Age: Patient is 55 yr old. Sex: Gender: male. BMI: 28.3 kg/m^2. Blood pressure: 128/88 Patient status: Outpatient. Study date: Study date: 01/20/2016. Study time: 01:22 PM. Location: [facility]  -------------------------------------------------------------------  ------------------------------------------------------------------- Left ventricle: The cavity size was normal. Wall thickness was increased in a pattern of moderate LVH. Systolic function was normal. The estimated ejection fraction was in the range of 60% to 65%. Wall motion was normal; there were no regional wall motion abnormalities. The study is  not technically sufficient to allow evaluation of LV diastolic function.  ------------------------------------------------------------------- Aortic valve: Sclerosis without stenosis. Doppler: There was trivial regurgitation.  ------------------------------------------------------------------- Aorta: Aortic root: The aortic root was normal in size. Ascending aorta: The ascending aorta was normal in size.  ------------------------------------------------------------------- Mitral valve: Mildly thickened leaflets with predominate prolapse of the posterior leaflet and anteriorly directed mitral regurgitation. Doppler: There was moderate to severe eccentric regurgitation directed at the atrial septum. Peak gradient (D): 4 mm Hg.  ------------------------------------------------------------------- Left atrium: Massively dilated at 85 ml/m2.  ------------------------------------------------------------------- Right ventricle: The cavity size  was normal. Wall thickness was normal. Systolic function was normal.  ------------------------------------------------------------------- Pulmonic valve: The valve appears to be grossly normal. Doppler: There was trivial regurgitation.  ------------------------------------------------------------------- Tricuspid valve: Doppler: There was mild regurgitation.  ------------------------------------------------------------------- Pulmonary artery: The main pulmonary artery was normal-sized.  ------------------------------------------------------------------- Right atrium: The atrium was normal in size.  ------------------------------------------------------------------- Pericardium: There was no pericardial effusion.  ------------------------------------------------------------------- Systemic veins: Inferior vena cava: The vessel was normal in size. The respirophasic diameter changes were in the normal range  (>= 50%), consistent with normal central venous pressure.  ------------------------------------------------------------------- Measurements  Left ventricle Value Reference LV ID, ED, PLAX chordal 48.4 mm 43 - 52 LV ID, ES, PLAX chordal 30.9 mm 23 - 38 LV fx shortening, PLAX chordal 36 % >=29 LV PW thickness, ED 13.7 mm --------- IVS/LV PW ratio, ED 1 <=1.3  Ventricular septum Value Reference IVS thickness, ED 13.7 mm ---------  LVOT Value Reference LVOT ID, S 27 mm --------- LVOT area 5.73 cm^2 --------- LVOT ID 27 mm --------- LVOT peak velocity, S 66.2 cm/s --------- LVOT mean velocity, S 42.7 cm/s --------- LVOT VTI, S 11.6 cm --------- LVOT peak gradient, S 2 mm Hg --------- Stroke volume (SV), LVOT DP 66.4 ml --------- Stroke index (SV/bsa), LVOT DP 32.7 ml/m^2 ---------  Aorta Value Reference Aortic root ID, ED 38 mm --------- Ascending aorta ID, A-P, S 35 mm ---------  Left atrium Value Reference LA ID, A-P, ES 49 mm --------- LA ID/bsa, A-P (H) 2.41 cm/m^2 <=2.2 LA volume, S 148 ml --------- LA volume/bsa, S 72.9 ml/m^2 --------- LA volume, ES, 1-p A4C 168 ml --------- LA volume/bsa, ES, 1-p A4C 82.7 ml/m^2 --------- LA  volume, ES, 1-p A2C 116 ml --------- LA volume/bsa, ES, 1-p A2C 57.1 ml/m^2 ---------  Mitral valve Value Reference Mitral E-wave peak velocity 94.4 cm/s --------- Mitral A-wave peak velocity 42.9 cm/s --------- Mitral deceleration time 194 ms 150 - 230 Mitral peak gradient, D 4 mm Hg --------- Mitral E/A ratio, peak 2.4 ---------  Pulmonary arteries Value Reference PA pressure, S, DP 27 mm Hg <=30  Tricuspid valve Value Reference Tricuspid regurg peak velocity 247 cm/s --------- Tricuspid peak RV-RA gradient 24 mm Hg ---------  Systemic veins Value Reference Estimated CVP 3 mm Hg ---------  Right ventricle Value Reference RV pressure, S, DP 27 mm Hg <=30  Legend: (L) and (H) mark values outside specified reference range.  ------------------------------------------------------------------- Prepared and Electronically Authenticated by  Lyman Bishop MD 2017-03-14T21:11:38    Transesophageal Echocardiography  Patient: Randol, Bermel MR #: [id number] Study Date: 02/11/2016 Gender: M Age: 66 Height: 175.3 cm Weight: 84.8 kg BSA: 2.05 m^2 Pt. Status: Room:  SONOGRAPHER Darlina Sicilian, RDCS ADMITTING [name], MD ATTENDING [name], MD ORDERING [name], MD PERFORMING [name], MD [place], MD  cc:  ------------------------------------------------------------------- LV EF: 60% -  65%  ------------------------------------------------------------------- Indications: Mitral regurgitation 424.0.  ------------------------------------------------------------------- History: PMH: Non-Hodgkin Lymphoma. Atrial Tachycardia. Murmur. PMH: Pulmonary Emboli.  ------------------------------------------------------------------- Study Conclusions  - Left ventricle: Systolic function was normal. The estimated  ejection fraction was in the range of 60% to 65%. Wall motion was  normal; there were no regional wall motion abnormalities. - Aortic valve: There was no regurgitation. - Mitral valve: Mild prolapse, involving the middle scallop of the  posterior leaflet. There was moderate to severe regurgitation  directed eccentrically and anteriorly. Valve area by continuity  equation (using LVOT flow): 2.55 cm^2. Effective regurgitant  orifice (PISA): 0.33 cm^2. Regurgitant volume (PISA): 48 ml. -  Left atrium: No evidence of thrombus in the atrial cavity or  appendage. No evidence of thrombus in the atrial cavity or  appendage. - Right atrium: No evidence of thrombus in the atrial cavity or  appendage. - Atrial septum: No defect or patent foramen ovale was identified  on saline microcavitation study. - Tricuspid valve: There was no significant regurgitation.  Diagnostic transesophageal echocardiography. 2D and color Doppler. Birthdate: Patient birthdate: [birth year]. Age: Patient is 55 yr old. Sex: Gender: male. BMI: 27.6 kg/m^2. Blood pressure: 130/93 Patient status: Inpatient. Study date: Study date: 02/11/2016. Study time: 10:57 AM. Location: Endoscopy.  -------------------------------------------------------------------  ------------------------------------------------------------------- Left ventricle: Systolic function was normal. The estimated ejection fraction was in the range of 60% to 65%. Wall motion was normal; there  were no regional wall motion abnormalities.  ------------------------------------------------------------------- Aortic valve: Structurally normal valve. Trileaflet; normal thickness leaflets. Cusp separation was normal. Doppler: There was no regurgitation.  ------------------------------------------------------------------- Aorta: The aorta was normal, not dilated, and non-diseased. There was no atheroma. There was no evidence for dissection. Aortic root: The aortic root was not dilated. Ascending aorta: The ascending aorta was normal in size. Aortic arch: The aortic arch was normal in size. Descending aorta: The descending aorta was normal in size.  ------------------------------------------------------------------- Mitral valve: Leaflet separation was normal. Mild prolapse, involving the middle scallop of the posterior leaflet. Doppler: There was moderate to severe regurgitation directed eccentrically and anteriorly. Valve area by continuity equation (using LVOT flow): 2.55 cm^2. Indexed valve area by continuity equation (using LVOT flow): 1.24 cm^2/m^2. Mean gradient (D): 1 mm Hg.  ------------------------------------------------------------------- Left atrium: The atrium was normal in size. No evidence of thrombus in the atrial cavity or appendage. No evidence of thrombus in the atrial cavity or appendage. The appendage was morphologically a left appendage, multilobulated, and of normal size. Emptying velocity was normal.  ------------------------------------------------------------------- Atrial septum: No defect or patent foramen ovale was identified on saline microcavitation study.  ------------------------------------------------------------------- Right ventricle: The cavity size was normal. Wall thickness was normal. Systolic function was normal.  ------------------------------------------------------------------- Pulmonic valve:  Structurally normal valve.  ------------------------------------------------------------------- Tricuspid valve: Structurally normal valve. Leaflet separation was normal. Doppler: There was no significant regurgitation.  ------------------------------------------------------------------- Pulmonary artery: The main pulmonary artery was normal-sized.  ------------------------------------------------------------------- Right atrium: The atrium was normal in size. No evidence of thrombus in the atrial cavity or appendage. The appendage was morphologically a right appendage.  ------------------------------------------------------------------- Pericardium: There was no pericardial effusion.  ------------------------------------------------------------------- Post procedure conclusions Ascending Aorta:  - The aorta was normal, not dilated, and non-diseased.  ------------------------------------------------------------------- Measurements  Left ventricle Value Stroke volume, 2D 52 ml Stroke volume/bsa, 2D 25 ml/m^2  LVOT Value LVOT ID, S 28 mm LVOT area 6.16 cm^2 LVOT peak velocity, S 44.2 cm/s LVOT mean velocity, S 29.3 cm/s LVOT VTI, S 8.41 cm  Mitral valve Value Mitral mean velocity, D 45.4 cm/s Mitral mean gradient, D 1 mm Hg Mitral valve area, LVOT continuity 2.55 cm^2 Mitral valve area/bsa, LVOT continuity 1.24 cm^2/m^2 Mitral annulus VTI, D 20.3 cm Mitral regurg VTI, PISA 144 cm Mitral ERO, PISA 0.33  cm^2 Mitral regurg volume, PISA 48 ml  Legend: (L) and (H) mark values outside specified reference range.  ------------------------------------------------------------------- Prepared and Electronically Authenticated by  [name], MD 2017-04-05T17:33:13   CT ANGIOGRAPHY CHEST WITH CONTRAST  TECHNIQUE: Multidetector CT imaging of the chest was performed using the standard protocol during bolus administration of intravenous contrast. Multiplanar CT image reconstructions and MIPs were obtained to evaluate the vascular anatomy.  CONTRAST: 100 mL of Isovue 370  COMPARISON: April 09, 2016  FINDINGS: Cardiovascular: The thoracic aorta is normal in caliber with no aneurysm or dissection. The previously seen pulmonary embolus in the right lower lobe is much smaller in the interval with only a small amount of residual linear thrombus remaining. No new emboli. The heart is unchanged.  Mediastinum/Nodes: No adenopathy in the bilateral axilla, retropectoral regions, or base of neck. Adenopathy persists within the mediastinum and hila, similar in the interval. A representative precarinal node on series 5, image 36 measures 18 mm in AP dimension, stable. An AP window node on image 37 measures 15 mm in short axis versus 16 mm previously. A prevascular node on image 39 measures 11 mm, unchanged in the interval. A right hilar node measures 2.4 cm in greatest dimension versus 2.2 cm previously, similar in the interval. A subcarinal node measures 1.8 cm today versus 1.7 cm previously. No definitive new or enlarging nodes. The esophagus and trachea are unremarkable.  Lungs/Pleura: The central airways are normal. No pneumothorax. Innumerable tiny pulmonary nodules seen in the lungs similar in the interval. Most of these mild nodules measure 2 or 3 mm. There is also nodularity along the pleural surfaces which is stable. The nodularity does not extend  along the fissures and is not in a perivascular distribution. No mass or focal infiltrate.  Upper Abdomen: Numerous hepatic cysts with no suspicious hepatic masses. Evaluation of the upper abdomen is otherwise limited due to timing of contrast but no other abnormalities are seen. No enlarged lymph nodes are identified in the upper abdomen.  Musculoskeletal: No chest wall mass or suspicious bone lesions identified.  Review of the MIP images confirms the above findings.  IMPRESSION: 1. The burden of pulmonary embolus in the right lower lobe has significantly diminished with only minimal linear thrombus remaining. The findings are consistent with chronic thrombus with no acute or new thrombus identified. 2. Persistent adenopathy in the mediastinum and hila correlates with the patient's history of non-Hodgkin's lymphoma. There has been no definitive interval change. 3. Persistent miliary pattern of nodularity in the lungs as well as numerous tiny nodules abutting the pleural surfaces. These findings are nonspecific. Sequela of malignancy including lymphoma could result in these findings. However, a variety of atypical infectious and inflammatory causes could present with a miliary pattern as well. Again, these findings are unchanged since June of 2017. Recommend clinical correlation and follow-up as clinically warranted. These results will be called to the ordering clinician or representative by the Radiologist Assistant, and communication documented in the PACS or zVision Dashboard.   Electronically Signed By: Dorise Bullion III M.D On: 07/20/2016 16:25    Right/Left Heart Cath and Coronary Angiography  Conclusion     The left ventricular systolic function is normal.  LV end diastolic pressure is normal.  The left ventricular ejection fraction is 55-65% by visual estimate.  There is moderate mitral valve prolapse.  LV end diastolic pressure is  normal.  Normal right heart pressures. CO 5.7 L/min. Cardiac index 2.8. PA sat 65%.  Tortuous abdominal aorta without aneurysm.  No significant coronary artery disease. 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  Restart Xarelto tomorrow. Aspirin also continued per his home meds. Followup with Dr. Oval Linsey.    Indications   Mitral valve insufficiency, unspecified etiology [I34.0 (ICD-10-CM)]  Procedural Details/Technique   Technical Details The risks, benefits, and details of the procedure were explained to the  patient. The patient verbalized understanding and wanted to proceed. Informed written consent was obtained.  PROCEDURE TECHNIQUE: After Xylocaine anesthesia, a 5 French sheath was placed in the right antecubital area in exchange for a peripheral IV. A 5 French balloontipped Swan-Ganz catheter was advanced to the pulmonary artery under fluoroscopic guidance. Hemodynamic pressures were obtained. Oxygen saturations were obtained. After Xylocaine anesthesia, a 52F sheath was placed in the right radial artery with a single anterior needle wall stick. Left coronary angiography was done using a Judkins L3.5 guide catheter. Right coronary angiography was done using a Judkins R4 guide catheter. Left heart cath/ventriculogram was done using a pigtail catheter and power injector. Abdominal aortogram was done using a pigtail catheter and power injector.    Contrast: 100 cc     Estimated blood loss <50 mL.  During this procedure the patient was administered the following to achieve and maintain moderate conscious sedation: Versed 2 mg, Fentanyl 50 mcg, while the patient's heart rate, blood pressure, and oxygen saturation were continuously monitored. The period of conscious sedation was 44 minutes, of which I was present face-to-face 100% of this time.    Complications   Complications documented before study signed (08/13/2016 8:58 AM EDT)   No  complications were associated with this study.  Documented by [name], MD - 08/13/2016 8:46 AM EDT    Coronary Findings   Dominance: Right  Left Main  Vessel is large. Aneurysmal in the mid to distal vessel. There is a bend at the ostium. There is no pressure dampening.  Right Coronary Artery  Vessel is large.  Right Heart   Right Heart Pressures LV EDP is normal.    Right Atrium Right atrial pressure is normal.    Left Heart   Left Ventricle The left ventricular size is in the upper limits of normal. The left ventricular systolic function is normal. LV end diastolic pressure is normal. The left ventricular ejection fraction is 55-65% by visual estimate. No regional wall motion abnormalities.    Mitral Valve There is moderate mitral valve prolapse. significant mitral regurgitation noted.    Coronary Diagrams   Diagnostic Diagram     Implants        No implant documentation for this case.  PACS Images   Show images for Cardiac catheterization   Link to Procedure Log   Procedure Log    Hemo Data   Flowsheet Row Most Recent Value  Fick Cardiac Output 5.7 L/min  Fick Cardiac Output Index 2.81 (L/min)/BSA  RA A Wave 5 mmHg  RA V Wave 2 mmHg  RA Mean 1 mmHg  RV Systolic Pressure 29 mmHg  RV Diastolic Pressure -1 mmHg  RV EDP 4 mmHg  PA Systolic Pressure 24 mmHg  PA Diastolic Pressure 7 mmHg  PA Mean 15 mmHg  PW A Wave 6 mmHg  PW V Wave 6 mmHg  PW Mean 4 mmHg  AO Systolic Pressure 89 mmHg  AO Diastolic Pressure 65 mmHg  AO Mean 78 mmHg  LV Systolic Pressure [id number] mmHg  LV Diastolic Pressure 1 mmHg  LV EDP 9 mmHg  Arterial Occlusion Pressure Extended Systolic Pressure [id number] mmHg  Arterial Occlusion Pressure Extended Diastolic Pressure 76 mmHg  Arterial Occlusion Pressure Extended Mean Pressure 92 mmHg  Left Ventricular Apex Extended Systolic Pressure 123XX123 mmHg  Left Ventricular Apex Extended Diastolic Pressure 4 mmHg  Left Ventricular Apex  Extended EDP Pressure 10 mmHg  QP/QS 1  TPVR Index 5.34 HRUI  TSVR Index 27.76  HRUI  PVR SVR Ratio 0.14  TPVR/TSVR Ratio 0.19   Patient has at least stage C and possibly early stage D severe primary mitral regurgitation. I have previoiusly reviewed the transesophageal echocardiogram performed recently by Dr. Oval Linsey. There is mild bileaflet prolapse with severe prolapse involving the posterior leaflet. I do not see any clear evidence for ruptured chordae tendineae and the jet of regurgitation is both eccentric and bidirectional. The severity of regurgitation meets criteria for being severe with regurgitant volume estimated 48 mL per beat. Left ventricular size and systolic function remains normal. There is no significant leaflet or subvalvular calcification. Anatomical characteristics appear favorable for valve repair. Options include continued medical therapy with close follow-up versus proceeding with elective valve repair. Risks associated with surgery should be very low. The patient appears to be a good candidate for minimally invasive approach for surgery.  Symptoms of tachycardia and palpitations have recurred since the patient stop taking his beta blocker recently.  Plan:  The patient was again counseled at length regarding the indications, risks and potential benefits of mitral valve repair.  The rationale for elective surgery has been explained, including a comparison between surgery and continued medical therapy with close follow-up.  The likelihood of successful and durable valve repair has been discussed with particular reference to the findings of their recent echocardiogram.  Based upon these findings and previous experience, I have quoted them a greater than 95 percent likelihood of successful valve repair.  In the unlikely event that their valve cannot be successfully repaired, we discussed the possibility of replacing the mitral valve using a mechanical prosthesis with the  attendant need for long-term anticoagulation versus the alternative of replacing it using a bioprosthetic tissue valve with its potential for late structural valve deterioration and failure, depending upon the patient's longevity.  The patient specifically requests that if the mitral valve must be replaced that it be done using a mechanical valve.   The patient understands and accepts all potential risks of surgery including but not limited to risk of death, stroke or other neurologic complication, myocardial infarction, congestive heart failure, respiratory failure, renal failure, bleeding requiring transfusion and/or reexploration, arrhythmia, infection or other wound complications, pneumonia, pleural and/or pericardial effusion, pulmonary embolus, aortic dissection or other major vascular complication, or delayed complications related to valve repair or replacement including but not limited to structural valve deterioration and failure, thrombosis, embolization, endocarditis, or paravalvular leak.  Alternative surgical approaches have been discussed including a comparison between conventional sternotomy and minimally-invasive techniques.  The relative risks and benefits of each have been reviewed as they pertain to the patient's specific circumstances, and all of their questions have been addressed.  Specific risks potentially related to the minimally-invasive approach were discussed at length, including but not limited to risk of conversion to full or partial sternotomy, aortic dissection or other major vascular complication, unilateral acute lung injury or pulmonary edema, phrenic nerve dysfunction or paralysis, rib fracture, chronic pain, lung hernia, or lymphocele.  Expectations for his postoperative convalescence have been discussed. All of his questions have been answered.  We plan to proceed with surgery on Wednesday, 10/27/2016. The patient has been instructed to stop taking Xarelto after he takes his  dose on Wednesday, 10/20/2016. He has been given a prescription for Lovenox injections for bridging therapy. The patient has also been instructed to resume taking Toprol-XL as previously prescribed.   Valentina Gu. Roxy Manns, MD 10/18/2016 4:06 PM

## 2016-10-27 ENCOUNTER — Encounter (HOSPITAL_COMMUNITY): Payer: Self-pay | Admitting: *Deleted

## 2016-10-27 ENCOUNTER — Inpatient Hospital Stay (HOSPITAL_COMMUNITY): Payer: No Typology Code available for payment source | Admitting: Anesthesiology

## 2016-10-27 ENCOUNTER — Inpatient Hospital Stay (HOSPITAL_COMMUNITY): Payer: No Typology Code available for payment source

## 2016-10-27 ENCOUNTER — Encounter (HOSPITAL_COMMUNITY)
Admission: RE | Disposition: A | Discharge status: Home / Self Care | Payer: Self-pay | Source: Ambulatory Visit | Attending: Thoracic Surgery (Cardiothoracic Vascular Surgery)

## 2016-10-27 ENCOUNTER — Inpatient Hospital Stay (HOSPITAL_COMMUNITY)
Admission: RE | Admit: 2016-10-27 | Discharge: 2016-10-31 | DRG: 220 | Disposition: A | Discharge status: Home / Self Care | Payer: No Typology Code available for payment source | Source: Ambulatory Visit | Attending: Thoracic Surgery (Cardiothoracic Vascular Surgery) | Admitting: Thoracic Surgery (Cardiothoracic Vascular Surgery)

## 2016-10-27 DIAGNOSIS — Z8249 Family history of ischemic heart disease and other diseases of the circulatory system: Secondary | ICD-10-CM | POA: Diagnosis not present

## 2016-10-27 DIAGNOSIS — Z01818 Encounter for other preprocedural examination: Secondary | ICD-10-CM | POA: Diagnosis not present

## 2016-10-27 DIAGNOSIS — Z79899 Other long term (current) drug therapy: Secondary | ICD-10-CM

## 2016-10-27 DIAGNOSIS — Z01810 Encounter for preprocedural cardiovascular examination: Secondary | ICD-10-CM

## 2016-10-27 DIAGNOSIS — I340 Nonrheumatic mitral (valve) insufficiency: Principal | ICD-10-CM | POA: Diagnosis present

## 2016-10-27 DIAGNOSIS — T451X5A Adverse effect of antineoplastic and immunosuppressive drugs, initial encounter: Secondary | ICD-10-CM | POA: Diagnosis present

## 2016-10-27 DIAGNOSIS — F329 Major depressive disorder, single episode, unspecified: Secondary | ICD-10-CM | POA: Diagnosis present

## 2016-10-27 DIAGNOSIS — Z86711 Personal history of pulmonary embolism: Secondary | ICD-10-CM | POA: Diagnosis present

## 2016-10-27 DIAGNOSIS — F419 Anxiety disorder, unspecified: Secondary | ICD-10-CM | POA: Diagnosis present

## 2016-10-27 DIAGNOSIS — Z9889 Other specified postprocedural states: Secondary | ICD-10-CM

## 2016-10-27 DIAGNOSIS — I341 Nonrheumatic mitral (valve) prolapse: Secondary | ICD-10-CM | POA: Diagnosis present

## 2016-10-27 DIAGNOSIS — Z720 Tobacco use: Secondary | ICD-10-CM | POA: Diagnosis not present

## 2016-10-27 DIAGNOSIS — Z4682 Encounter for fitting and adjustment of non-vascular catheter: Secondary | ICD-10-CM

## 2016-10-27 DIAGNOSIS — D709 Neutropenia, unspecified: Secondary | ICD-10-CM | POA: Diagnosis present

## 2016-10-27 DIAGNOSIS — G629 Polyneuropathy, unspecified: Secondary | ICD-10-CM | POA: Diagnosis present

## 2016-10-27 DIAGNOSIS — Z7901 Long term (current) use of anticoagulants: Secondary | ICD-10-CM

## 2016-10-27 DIAGNOSIS — E8770 Fluid overload, unspecified: Secondary | ICD-10-CM | POA: Diagnosis not present

## 2016-10-27 DIAGNOSIS — D649 Anemia, unspecified: Secondary | ICD-10-CM | POA: Diagnosis present

## 2016-10-27 DIAGNOSIS — C8590 Non-Hodgkin lymphoma, unspecified, unspecified site: Secondary | ICD-10-CM | POA: Diagnosis present

## 2016-10-27 DIAGNOSIS — I441 Atrioventricular block, second degree: Secondary | ICD-10-CM | POA: Diagnosis present

## 2016-10-27 DIAGNOSIS — Z01812 Encounter for preprocedural laboratory examination: Secondary | ICD-10-CM

## 2016-10-27 DIAGNOSIS — J9811 Atelectasis: Secondary | ICD-10-CM

## 2016-10-27 HISTORY — DX: Other specified postprocedural states: Z98.890

## 2016-10-27 HISTORY — PX: TEE WITHOUT CARDIOVERSION: SHX5443

## 2016-10-27 HISTORY — PX: MITRAL VALVE REPAIR: SHX2039

## 2016-10-27 LAB — POCT I-STAT 3, ART BLOOD GAS (G3+)
Acid-Base Excess: 2 mmol/L (ref 0.0–2.0)
Acid-base deficit: 1 mmol/L (ref 0.0–2.0)
Acid-base deficit: 1 mmol/L (ref 0.0–2.0)
Acid-base deficit: 4 mmol/L — ABNORMAL HIGH (ref 0.0–2.0)
Acid-base deficit: 4 mmol/L — ABNORMAL HIGH (ref 0.0–2.0)
Bicarbonate: 25.4 mmol/L (ref 20.0–28.0)
Bicarbonate: 26.9 mmol/L (ref 20.0–28.0)
Bicarbonate: 24 mmol/L (ref 20.0–28.0)
Bicarbonate: 23 mmol/L (ref 20.0–28.0)
Bicarbonate: 21.8 mmol/L (ref 20.0–28.0)
Bicarbonate: 21.7 mmol/L (ref 20.0–28.0)
O2 Saturation: 100 %
O2 Saturation: 100 %
O2 Saturation: 95 %
O2 Saturation: 99 %
O2 Saturation: 97 %
O2 Saturation: 96 %
Patient temperature: 34.5
Patient temperature: 36.9
Patient temperature: 37.2
TCO2: 27 mmol/L (ref 0–100)
TCO2: 28 mmol/L (ref 0–100)
TCO2: 25 mmol/L (ref 0–100)
TCO2: 24 mmol/L (ref 0–100)
TCO2: 23 mmol/L (ref 0–100)
TCO2: 23 mmol/L (ref 0–100)
pCO2 arterial: 44.7 mmHg (ref 32.0–48.0)
pCO2 arterial: 44.9 mmHg (ref 32.0–48.0)
pCO2 arterial: 38.6 mmHg (ref 32.0–48.0)
pCO2 arterial: 32.8 mmHg (ref 32.0–48.0)
pCO2 arterial: 40.4 mmHg (ref 32.0–48.0)
pCO2 arterial: 40.3 mmHg (ref 32.0–48.0)
pH, Arterial: 7.362 (ref 7.350–7.450)
pH, Arterial: 7.387 (ref 7.350–7.450)
pH, Arterial: 7.403 (ref 7.350–7.450)
pH, Arterial: 7.443 (ref 7.350–7.450)
pH, Arterial: 7.34 — ABNORMAL LOW (ref 7.350–7.450)
pH, Arterial: 7.34 — ABNORMAL LOW (ref 7.350–7.450)
pO2, Arterial: 400 mmHg — ABNORMAL HIGH (ref 83.0–108.0)
pO2, Arterial: 515 mmHg — ABNORMAL HIGH (ref 83.0–108.0)
pO2, Arterial: 77 mmHg — ABNORMAL LOW (ref 83.0–108.0)
pO2, Arterial: 143 mmHg — ABNORMAL HIGH (ref 83.0–108.0)
pO2, Arterial: 91 mmHg (ref 83.0–108.0)
pO2, Arterial: 89 mmHg (ref 83.0–108.0)

## 2016-10-27 LAB — CBC
HCT: 31.3 % — ABNORMAL LOW (ref 39.0–52.0)
HCT: 26.1 % — ABNORMAL LOW (ref 39.0–52.0)
Hemoglobin: 10.2 g/dL — ABNORMAL LOW (ref 13.0–17.0)
Hemoglobin: 8.3 g/dL — ABNORMAL LOW (ref 13.0–17.0)
MCH: 27 pg (ref 26.0–34.0)
MCH: 26.9 pg (ref 26.0–34.0)
MCHC: 32.6 g/dL (ref 30.0–36.0)
MCHC: 31.8 g/dL (ref 30.0–36.0)
MCV: 82.8 fL (ref 78.0–100.0)
MCV: 84.7 fL (ref 78.0–100.0)
Platelets: 103 10*3/uL — ABNORMAL LOW (ref 150–400)
Platelets: 103 10*3/uL — ABNORMAL LOW (ref 150–400)
RBC: 3.78 MIL/uL — ABNORMAL LOW (ref 4.22–5.81)
RBC: 3.08 MIL/uL — ABNORMAL LOW (ref 4.22–5.81)
RDW: 15.7 % — ABNORMAL HIGH (ref 11.5–15.5)
RDW: 16.3 % — ABNORMAL HIGH (ref 11.5–15.5)
WBC: 3.8 10*3/uL — ABNORMAL LOW (ref 4.0–10.5)
WBC: 3.5 10*3/uL — ABNORMAL LOW (ref 4.0–10.5)

## 2016-10-27 LAB — POCT I-STAT, CHEM 8
BUN: 9 mg/dL (ref 6–20)
BUN: 8 mg/dL (ref 6–20)
BUN: 7 mg/dL (ref 6–20)
BUN: 7 mg/dL (ref 6–20)
BUN: 7 mg/dL (ref 6–20)
BUN: 5 mg/dL — ABNORMAL LOW (ref 6–20)
Calcium, Ion: 1.24 mmol/L (ref 1.15–1.40)
Calcium, Ion: 1.2 mmol/L (ref 1.15–1.40)
Calcium, Ion: 1.08 mmol/L — ABNORMAL LOW (ref 1.15–1.40)
Calcium, Ion: 1.08 mmol/L — ABNORMAL LOW (ref 1.15–1.40)
Calcium, Ion: 1.11 mmol/L — ABNORMAL LOW (ref 1.15–1.40)
Calcium, Ion: 1.27 mmol/L (ref 1.15–1.40)
Chloride: 106 mmol/L (ref 101–111)
Chloride: 105 mmol/L (ref 101–111)
Chloride: 104 mmol/L (ref 101–111)
Chloride: 103 mmol/L (ref 101–111)
Chloride: 104 mmol/L (ref 101–111)
Chloride: 110 mmol/L (ref 101–111)
Creatinine, Ser: 0.6 mg/dL — ABNORMAL LOW (ref 0.61–1.24)
Creatinine, Ser: 0.8 mg/dL (ref 0.61–1.24)
Creatinine, Ser: 0.5 mg/dL — ABNORMAL LOW (ref 0.61–1.24)
Creatinine, Ser: 0.6 mg/dL — ABNORMAL LOW (ref 0.61–1.24)
Creatinine, Ser: 0.7 mg/dL (ref 0.61–1.24)
Creatinine, Ser: 0.8 mg/dL (ref 0.61–1.24)
Glucose, Bld: 105 mg/dL — ABNORMAL HIGH (ref 65–99)
Glucose, Bld: 133 mg/dL — ABNORMAL HIGH (ref 65–99)
Glucose, Bld: 124 mg/dL — ABNORMAL HIGH (ref 65–99)
Glucose, Bld: 167 mg/dL — ABNORMAL HIGH (ref 65–99)
Glucose, Bld: 184 mg/dL — ABNORMAL HIGH (ref 65–99)
Glucose, Bld: 92 mg/dL (ref 65–99)
HCT: 32 % — ABNORMAL LOW (ref 39.0–52.0)
HCT: 32 % — ABNORMAL LOW (ref 39.0–52.0)
HCT: 30 % — ABNORMAL LOW (ref 39.0–52.0)
HCT: 26 % — ABNORMAL LOW (ref 39.0–52.0)
HCT: 27 % — ABNORMAL LOW (ref 39.0–52.0)
HCT: 27 % — ABNORMAL LOW (ref 39.0–52.0)
Hemoglobin: 10.9 g/dL — ABNORMAL LOW (ref 13.0–17.0)
Hemoglobin: 10.9 g/dL — ABNORMAL LOW (ref 13.0–17.0)
Hemoglobin: 10.2 g/dL — ABNORMAL LOW (ref 13.0–17.0)
Hemoglobin: 8.8 g/dL — ABNORMAL LOW (ref 13.0–17.0)
Hemoglobin: 9.2 g/dL — ABNORMAL LOW (ref 13.0–17.0)
Hemoglobin: 9.2 g/dL — ABNORMAL LOW (ref 13.0–17.0)
Potassium: 3.5 mmol/L (ref 3.5–5.1)
Potassium: 3.5 mmol/L (ref 3.5–5.1)
Potassium: 3.7 mmol/L (ref 3.5–5.1)
Potassium: 4.6 mmol/L (ref 3.5–5.1)
Potassium: 3.6 mmol/L (ref 3.5–5.1)
Potassium: 4 mmol/L (ref 3.5–5.1)
Sodium: 141 mmol/L (ref 135–145)
Sodium: 140 mmol/L (ref 135–145)
Sodium: 140 mmol/L (ref 135–145)
Sodium: 137 mmol/L (ref 135–145)
Sodium: 138 mmol/L (ref 135–145)
Sodium: 142 mmol/L (ref 135–145)
TCO2: 27 mmol/L (ref 0–100)
TCO2: 23 mmol/L (ref 0–100)
TCO2: 26 mmol/L (ref 0–100)
TCO2: 25 mmol/L (ref 0–100)
TCO2: 23 mmol/L (ref 0–100)
TCO2: 21 mmol/L (ref 0–100)

## 2016-10-27 LAB — GLUCOSE, CAPILLARY
Glucose-Capillary: 107 mg/dL — ABNORMAL HIGH (ref 65–99)
Glucose-Capillary: 57 mg/dL — ABNORMAL LOW (ref 65–99)
Glucose-Capillary: 58 mg/dL — ABNORMAL LOW (ref 65–99)
Glucose-Capillary: 91 mg/dL (ref 65–99)
Glucose-Capillary: 76 mg/dL (ref 65–99)
Glucose-Capillary: 90 mg/dL (ref 65–99)
Glucose-Capillary: 76 mg/dL (ref 65–99)

## 2016-10-27 LAB — MAGNESIUM: Magnesium: 2.3 mg/dL (ref 1.7–2.4)

## 2016-10-27 LAB — PREPARE RBC (CROSSMATCH)

## 2016-10-27 LAB — CREATININE, SERUM
Creatinine, Ser: 0.88 mg/dL (ref 0.61–1.24)
GFR calc Af Amer: >60 mL/min (ref >60)
GFR calc non Af Amer: >60 mL/min (ref >60)

## 2016-10-27 LAB — PROTIME-INR
INR: 1.42
Prothrombin Time: 17.4 seconds — ABNORMAL HIGH (ref 11.4–15.2)

## 2016-10-27 LAB — HEMOGLOBIN AND HEMATOCRIT, BLOOD
HCT: 24.6 % — ABNORMAL LOW (ref 39.0–52.0)
Hemoglobin: 7.9 g/dL — ABNORMAL LOW (ref 13.0–17.0)

## 2016-10-27 LAB — ECHO INTRAOPERATIVE TEE

## 2016-10-27 LAB — PLATELET COUNT: Platelets: 119 10*3/uL — ABNORMAL LOW (ref 150–400)

## 2016-10-27 LAB — APTT: aPTT: 33 seconds (ref 24–36)

## 2016-10-27 SURGERY — REPAIR, MITRAL VALVE, MINIMALLY INVASIVE
Anesthesia: General | Site: Chest | Laterality: Right

## 2016-10-27 MED ORDER — METOPROLOL TARTRATE 12.5 MG HALF TABLET: 12.5000 mg | ORAL_TABLET | Freq: Once | ORAL | Status: DC

## 2016-10-27 MED ORDER — FENTANYL CITRATE (PF) 250 MCG/5ML IJ SOLN
INTRAMUSCULAR | Status: AC
  Filled 2016-10-27: qty 5

## 2016-10-27 MED ORDER — METOPROLOL TARTRATE 25 MG/10 ML ORAL SUSPENSION: 12.5000 mg | Freq: Two times a day (BID) | ORAL | Status: DC

## 2016-10-27 MED ORDER — PHENYLEPHRINE HCL 10 MG/ML IJ SOLN
0.0000 ug/min | INTRAVENOUS | Status: DC
  Filled 2016-10-27 (×2): qty 2

## 2016-10-27 MED ORDER — LACTATED RINGERS IV SOLN: 500.0000 mL | Freq: Once | INTRAVENOUS | Status: DC | PRN

## 2016-10-27 MED ORDER — ALBUMIN HUMAN 5 % IV SOLN
INTRAVENOUS | Status: DC | PRN
  Administered 2016-10-27: 13:00:00 via INTRAVENOUS

## 2016-10-27 MED ORDER — ACETAMINOPHEN 160 MG/5ML PO SOLN: 1000.0000 mg | Freq: Four times a day (QID) | ORAL | Status: DC

## 2016-10-27 MED ORDER — SODIUM CHLORIDE 0.9 % IV SOLN
INTRAVENOUS | Status: DC
  Administered 2016-10-27: 15:00:00 via INTRAVENOUS

## 2016-10-27 MED ORDER — VECURONIUM BROMIDE 10 MG IV SOLR
INTRAVENOUS | Status: AC
  Filled 2016-10-27: qty 20

## 2016-10-27 MED ORDER — DEXMEDETOMIDINE HCL IN NACL 200 MCG/50ML IV SOLN
0.0000 ug/kg/h | INTRAVENOUS | Status: DC
  Administered 2016-10-28: 0.2 ug/kg/h via INTRAVENOUS
  Filled 2016-10-27 (×2): qty 50

## 2016-10-27 MED ORDER — ACETAMINOPHEN 500 MG PO TABS
1000.0000 mg | ORAL_TABLET | Freq: Four times a day (QID) | ORAL | Status: DC
  Administered 2016-10-27 – 2016-10-30 (×10): 1000 mg via ORAL
  Filled 2016-10-27 (×13): qty 2

## 2016-10-27 MED ORDER — MORPHINE SULFATE (PF) 2 MG/ML IV SOLN
1.0000 mg | INTRAVENOUS | Status: DC | PRN
  Administered 2016-10-27 – 2016-10-28 (×5): 2 mg via INTRAVENOUS
  Filled 2016-10-27 (×5): qty 1

## 2016-10-27 MED ORDER — SODIUM CHLORIDE 0.9 % IV SOLN: 250.0000 mL | INTRAVENOUS | Status: DC

## 2016-10-27 MED ORDER — VANCOMYCIN HCL 1000 MG IV SOLR
INTRAVENOUS | Status: DC | PRN
  Administered 2016-10-27: 1250 mg via INTRAVENOUS

## 2016-10-27 MED ORDER — MIDAZOLAM HCL 2 MG/2ML IJ SOLN
2.0000 mg | INTRAMUSCULAR | Status: DC | PRN
  Filled 2016-10-27: qty 2

## 2016-10-27 MED ORDER — CHLORHEXIDINE GLUCONATE 0.12 % MT SOLN
OROMUCOSAL | Status: AC
  Administered 2016-10-27: 15 mL via OROMUCOSAL
  Filled 2016-10-27: qty 15

## 2016-10-27 MED ORDER — ONDANSETRON HCL 4 MG/2ML IJ SOLN: 4.0000 mg | Freq: Four times a day (QID) | INTRAMUSCULAR | Status: DC | PRN

## 2016-10-27 MED ORDER — ACETAMINOPHEN 650 MG RE SUPP
650.0000 mg | Freq: Once | RECTAL | Status: AC
  Administered 2016-10-27: 650 mg via RECTAL

## 2016-10-27 MED ORDER — PROPOFOL 10 MG/ML IV BOLUS
INTRAVENOUS | Status: DC | PRN
  Administered 2016-10-27: 70 mg via INTRAVENOUS

## 2016-10-27 MED ORDER — INSULIN ASPART 100 UNIT/ML ~~LOC~~ SOLN
0.0000 [IU] | SUBCUTANEOUS | Status: DC
  Administered 2016-10-28 (×3): 2 [IU] via SUBCUTANEOUS

## 2016-10-27 MED ORDER — ORAL CARE MOUTH RINSE: 15.0000 mL | Freq: Four times a day (QID) | OROMUCOSAL | Status: DC

## 2016-10-27 MED ORDER — PROTAMINE SULFATE 10 MG/ML IV SOLN
INTRAVENOUS | Status: AC
  Filled 2016-10-27: qty 10

## 2016-10-27 MED ORDER — INSULIN REGULAR HUMAN 100 UNIT/ML IJ SOLN
INTRAMUSCULAR | Status: DC | PRN
  Administered 2016-10-27: 2.1 [IU]/h via INTRAVENOUS

## 2016-10-27 MED ORDER — PROTAMINE SULFATE 10 MG/ML IV SOLN
INTRAVENOUS | Status: DC | PRN
  Administered 2016-10-27: 340 mg via INTRAVENOUS

## 2016-10-27 MED ORDER — FENTANYL CITRATE (PF) 250 MCG/5ML IJ SOLN
INTRAMUSCULAR | Status: AC
  Filled 2016-10-27: qty 25

## 2016-10-27 MED ORDER — ALBUMIN HUMAN 5 % IV SOLN
250.0000 mL | INTRAVENOUS | Status: AC | PRN
  Administered 2016-10-27 (×4): 250 mL via INTRAVENOUS
  Filled 2016-10-27: qty 250

## 2016-10-27 MED ORDER — PHENYLEPHRINE HCL 10 MG/ML IJ SOLN
INTRAVENOUS | Status: DC | PRN
  Administered 2016-10-27: 25 ug/min via INTRAVENOUS

## 2016-10-27 MED ORDER — SODIUM CHLORIDE 0.45 % IV SOLN: INTRAVENOUS | Status: DC | PRN

## 2016-10-27 MED ORDER — SODIUM CHLORIDE 0.9% FLUSH
3.0000 mL | Freq: Two times a day (BID) | INTRAVENOUS | Status: DC
  Administered 2016-10-28 – 2016-10-30 (×3): 3 mL via INTRAVENOUS

## 2016-10-27 MED ORDER — HEPARIN SODIUM (PORCINE) 1000 UNIT/ML IJ SOLN
INTRAMUSCULAR | Status: AC
  Filled 2016-10-27: qty 1

## 2016-10-27 MED ORDER — ORAL CARE MOUTH RINSE
15.0000 mL | Freq: Two times a day (BID) | OROMUCOSAL | Status: DC
  Administered 2016-10-28 – 2016-10-30 (×4): 15 mL via OROMUCOSAL

## 2016-10-27 MED ORDER — LACTATED RINGERS IV SOLN
INTRAVENOUS | Status: DC
  Administered 2016-10-27: 15:00:00 via INTRAVENOUS

## 2016-10-27 MED ORDER — LACTATED RINGERS IV SOLN: INTRAVENOUS | Status: DC

## 2016-10-27 MED ORDER — TRAMADOL HCL 50 MG PO TABS
50.0000 mg | ORAL_TABLET | ORAL | Status: DC | PRN
  Administered 2016-10-30: 50 mg via ORAL
  Filled 2016-10-27: qty 1

## 2016-10-27 MED ORDER — PANTOPRAZOLE SODIUM 40 MG PO TBEC
40.0000 mg | DELAYED_RELEASE_TABLET | Freq: Every day | ORAL | Status: DC
  Administered 2016-10-28 – 2016-10-31 (×4): 40 mg via ORAL
  Filled 2016-10-27 (×4): qty 1

## 2016-10-27 MED ORDER — PROPOFOL 10 MG/ML IV BOLUS
INTRAVENOUS | Status: AC
  Filled 2016-10-27: qty 20

## 2016-10-27 MED ORDER — CHLORHEXIDINE GLUCONATE 0.12% ORAL RINSE (MEDLINE KIT)
15.0000 mL | Freq: Two times a day (BID) | OROMUCOSAL | Status: DC
  Administered 2016-10-27: 15 mL via OROMUCOSAL

## 2016-10-27 MED ORDER — METOPROLOL TARTRATE 12.5 MG HALF TABLET
12.5000 mg | ORAL_TABLET | Freq: Two times a day (BID) | ORAL | Status: DC
  Administered 2016-10-29 (×2): 12.5 mg via ORAL
  Filled 2016-10-27 (×4): qty 1

## 2016-10-27 MED ORDER — VECURONIUM BROMIDE 10 MG IV SOLR
INTRAVENOUS | Status: DC | PRN
  Administered 2016-10-27 (×4): 4 mg via INTRAVENOUS

## 2016-10-27 MED ORDER — ALBUMIN HUMAN 5 % IV SOLN
250.0000 mL | INTRAVENOUS | Status: DC | PRN
  Administered 2016-10-27: 250 mL via INTRAVENOUS
  Filled 2016-10-27: qty 250

## 2016-10-27 MED ORDER — MAGNESIUM SULFATE 4 GM/100ML IV SOLN
4.0000 g | Freq: Once | INTRAVENOUS | Status: AC
  Administered 2016-10-27: 4 g via INTRAVENOUS
  Filled 2016-10-27: qty 100

## 2016-10-27 MED ORDER — METOPROLOL TARTRATE 5 MG/5ML IV SOLN: 2.5000 mg | INTRAVENOUS | Status: DC | PRN

## 2016-10-27 MED ORDER — HEPARIN SODIUM (PORCINE) 1000 UNIT/ML IJ SOLN
INTRAMUSCULAR | Status: DC | PRN
  Administered 2016-10-27: 34000 [IU] via INTRAVENOUS
  Administered 2016-10-27: 10000 [IU] via INTRAVENOUS

## 2016-10-27 MED ORDER — FENTANYL CITRATE (PF) 250 MCG/5ML IJ SOLN
INTRAMUSCULAR | Status: DC | PRN
  Administered 2016-10-27: 500 ug via INTRAVENOUS
  Administered 2016-10-27: 200 ug via INTRAVENOUS
  Administered 2016-10-27: 50 ug via INTRAVENOUS
  Administered 2016-10-27 (×4): 250 ug via INTRAVENOUS

## 2016-10-27 MED ORDER — ALBUMIN HUMAN 5 % IV SOLN: 250.0000 mL | INTRAVENOUS | Status: AC | PRN

## 2016-10-27 MED ORDER — 0.9 % SODIUM CHLORIDE (POUR BTL) OPTIME
TOPICAL | Status: DC | PRN
  Administered 2016-10-27: 6000 mL

## 2016-10-27 MED ORDER — SODIUM CHLORIDE 0.9 % IJ SOLN
INTRAMUSCULAR | Status: AC
  Filled 2016-10-27: qty 10

## 2016-10-27 MED ORDER — SODIUM CHLORIDE 0.9 % IV SOLN
1.0000 g | Freq: Once | INTRAVENOUS | Status: AC
  Administered 2016-10-27: 1 g via INTRAVENOUS

## 2016-10-27 MED ORDER — DEXTROSE 5 % IV SOLN
1.5000 g | Freq: Two times a day (BID) | INTRAVENOUS | Status: AC
Start: 2016-10-27 — End: 2016-10-29
  Administered 2016-10-27 – 2016-10-29 (×4): 1.5 g via INTRAVENOUS
  Filled 2016-10-27 (×4): qty 1.5

## 2016-10-27 MED ORDER — NITROGLYCERIN IN D5W 200-5 MCG/ML-% IV SOLN
0.0000 ug/min | INTRAVENOUS | Status: DC
  Administered 2016-10-27: 10 ug/min via INTRAVENOUS
  Filled 2016-10-27: qty 250

## 2016-10-27 MED ORDER — SODIUM CHLORIDE 0.9 % IV SOLN
INTRAVENOUS | Status: DC
  Filled 2016-10-27 (×2): qty 2.5

## 2016-10-27 MED ORDER — DEXTROSE 50 % IV SOLN
INTRAVENOUS | Status: AC
  Administered 2016-10-27: 17 mL
  Filled 2016-10-27: qty 50

## 2016-10-27 MED ORDER — LACTATED RINGERS IV SOLN
INTRAVENOUS | Status: DC | PRN
  Administered 2016-10-27: 08:00:00 via INTRAVENOUS

## 2016-10-27 MED ORDER — MIDAZOLAM HCL 10 MG/2ML IJ SOLN
INTRAMUSCULAR | Status: AC
  Filled 2016-10-27: qty 2

## 2016-10-27 MED ORDER — BISACODYL 10 MG RE SUPP: 10.0000 mg | Freq: Every day | RECTAL | Status: DC

## 2016-10-27 MED ORDER — FAMOTIDINE IN NACL 20-0.9 MG/50ML-% IV SOLN
20.0000 mg | Freq: Two times a day (BID) | INTRAVENOUS | Status: DC
  Administered 2016-10-27: 20 mg via INTRAVENOUS

## 2016-10-27 MED ORDER — SODIUM CHLORIDE 0.9% FLUSH: 3.0000 mL | INTRAVENOUS | Status: DC | PRN

## 2016-10-27 MED ORDER — MORPHINE SULFATE (PF) 2 MG/ML IV SOLN: 1.0000 mg | INTRAVENOUS | Status: DC | PRN

## 2016-10-27 MED ORDER — ASPIRIN 81 MG PO CHEW: 324.0000 mg | CHEWABLE_TABLET | Freq: Every day | ORAL | Status: DC

## 2016-10-27 MED ORDER — OXYCODONE HCL 5 MG PO TABS
5.0000 mg | ORAL_TABLET | ORAL | Status: DC | PRN
  Administered 2016-10-28 (×4): 10 mg via ORAL
  Administered 2016-10-29: 5 mg via ORAL
  Administered 2016-10-29 – 2016-10-30 (×3): 10 mg via ORAL
  Filled 2016-10-27 (×6): qty 2
  Filled 2016-10-27: qty 1
  Filled 2016-10-27: qty 2

## 2016-10-27 MED ORDER — BUPIVACAINE 0.5 % ON-Q PUMP SINGLE CATH 400 ML
400.0000 mL | INJECTION | Status: DC
  Filled 2016-10-27: qty 400

## 2016-10-27 MED ORDER — SODIUM CHLORIDE 0.9 % IV SOLN
INTRAVENOUS | Status: DC | PRN
  Administered 2016-10-27: 13:00:00 via INTRAVENOUS

## 2016-10-27 MED ORDER — ASPIRIN EC 325 MG PO TBEC
325.0000 mg | DELAYED_RELEASE_TABLET | Freq: Every day | ORAL | Status: DC
  Administered 2016-10-28 – 2016-10-29 (×2): 325 mg via ORAL
  Filled 2016-10-27 (×2): qty 1

## 2016-10-27 MED ORDER — ROCURONIUM BROMIDE 100 MG/10ML IV SOLN
INTRAVENOUS | Status: DC | PRN
  Administered 2016-10-27: 50 mg via INTRAVENOUS

## 2016-10-27 MED ORDER — ROCURONIUM BROMIDE 50 MG/5ML IV SOSY
PREFILLED_SYRINGE | INTRAVENOUS | Status: AC
  Filled 2016-10-27: qty 5

## 2016-10-27 MED ORDER — VANCOMYCIN HCL IN DEXTROSE 1-5 GM/200ML-% IV SOLN
1000.0000 mg | Freq: Once | INTRAVENOUS | Status: AC
  Administered 2016-10-27: 1000 mg via INTRAVENOUS
  Filled 2016-10-27: qty 200

## 2016-10-27 MED ORDER — CHLORHEXIDINE GLUCONATE 0.12 % MT SOLN
15.0000 mL | Freq: Once | OROMUCOSAL | Status: AC
  Administered 2016-10-27: 15 mL via OROMUCOSAL

## 2016-10-27 MED ORDER — SODIUM CHLORIDE 0.9 % IR SOLN
Status: DC | PRN
  Administered 2016-10-27: 3000 mL

## 2016-10-27 MED ORDER — DOCUSATE SODIUM 100 MG PO CAPS
200.0000 mg | ORAL_CAPSULE | Freq: Every day | ORAL | Status: DC
  Administered 2016-10-28 – 2016-10-30 (×2): 200 mg via ORAL
  Filled 2016-10-27 (×2): qty 2

## 2016-10-27 MED ORDER — BISACODYL 5 MG PO TBEC
10.0000 mg | DELAYED_RELEASE_TABLET | Freq: Every day | ORAL | Status: DC
  Administered 2016-10-28 – 2016-10-30 (×2): 10 mg via ORAL
  Filled 2016-10-27 (×2): qty 2

## 2016-10-27 MED ORDER — INSULIN REGULAR BOLUS VIA INFUSION
0.0000 [IU] | Freq: Three times a day (TID) | INTRAVENOUS | Status: DC
  Filled 2016-10-27: qty 10

## 2016-10-27 MED ORDER — CHLORHEXIDINE GLUCONATE 4 % EX LIQD: 30.0000 mL | CUTANEOUS | Status: DC

## 2016-10-27 MED ORDER — BUPIVACAINE 0.5 % ON-Q PUMP SINGLE CATH 400 ML
INJECTION | Status: DC | PRN
  Administered 2016-10-27: 400 mL

## 2016-10-27 MED ORDER — SODIUM CHLORIDE 0.9 % IV SOLN
30.0000 meq | Freq: Once | INTRAVENOUS | Status: AC
  Administered 2016-10-27: 30 meq via INTRAVENOUS
  Filled 2016-10-27: qty 15

## 2016-10-27 MED ORDER — CHLORHEXIDINE GLUCONATE 0.12 % MT SOLN
15.0000 mL | OROMUCOSAL | Status: AC
  Administered 2016-10-27: 15 mL via OROMUCOSAL

## 2016-10-27 MED ORDER — ACETAMINOPHEN 160 MG/5ML PO SOLN: 650.0000 mg | Freq: Once | ORAL | Status: AC

## 2016-10-27 MED ORDER — MIDAZOLAM HCL 5 MG/5ML IJ SOLN
INTRAMUSCULAR | Status: DC | PRN
  Administered 2016-10-27: 1 mg via INTRAVENOUS
  Administered 2016-10-27 (×2): 2 mg via INTRAVENOUS
  Administered 2016-10-27: 3 mg via INTRAVENOUS
  Administered 2016-10-27: 2 mg via INTRAVENOUS

## 2016-10-27 MED ORDER — PROTAMINE SULFATE 10 MG/ML IV SOLN
INTRAVENOUS | Status: AC
  Filled 2016-10-27: qty 25

## 2016-10-27 MED FILL — Magnesium Sulfate Inj 50%: INTRAMUSCULAR | Qty: 10 | Status: AC

## 2016-10-27 MED FILL — Potassium Chloride Inj 2 mEq/ML: INTRAVENOUS | Qty: 40 | Status: AC

## 2016-10-27 MED FILL — Heparin Sodium (Porcine) Inj 1000 Unit/ML: INTRAMUSCULAR | Qty: 30 | Status: AC

## 2016-10-27 SURGICAL SUPPLY — 100 items
ADAPTER CARDIO PERF ANTE/RETRO (ADAPTER) ×3 IMPLANT
ADH SKN CLS APL DERMABOND .7 (GAUZE/BANDAGES/DRESSINGS) ×2
ADPR PRFSN 84XANTGRD RTRGD (ADAPTER) ×2
BAG DECANTER FOR FLEXI CONT (MISCELLANEOUS) ×5 IMPLANT
BLADE STERNUM SYSTEM 6 (BLADE) ×1 IMPLANT
BLADE SURG 11 STRL SS (BLADE) ×3 IMPLANT
CANISTER SUCTION 2500CC (MISCELLANEOUS) ×6 IMPLANT
CANNULA FEM VENOUS REMOTE 22FR (CANNULA) ×1 IMPLANT
CANNULA FEMORAL ART 14 SM (MISCELLANEOUS) ×3 IMPLANT
CANNULA GUNDRY RCSP 15FR (MISCELLANEOUS) ×3 IMPLANT
CANNULA OPTISITE PERFUSION 16F (CANNULA) IMPLANT
CANNULA OPTISITE PERFUSION 18F (CANNULA) ×1 IMPLANT
CANNULA SUMP PERICARDIAL (CANNULA) ×6 IMPLANT
CATH KIT ON Q 5IN SLV (PAIN MANAGEMENT) IMPLANT
CATH KIT ON-Q SILVERSOAK 5 (CATHETERS) IMPLANT
CATH KIT ON-Q SILVERSOAK 5IN (CATHETERS) ×3 IMPLANT
CONN ST 1/4X3/8  BEN (MISCELLANEOUS) ×2
CONN ST 1/4X3/8 BEN (MISCELLANEOUS) ×4 IMPLANT
CONNECTOR 1/2X3/8X1/2 3 WAY (MISCELLANEOUS) ×1
CONNECTOR 1/2X3/8X1/2 3WAY (MISCELLANEOUS) ×2 IMPLANT
CONT SPEC STER OR (MISCELLANEOUS) ×3 IMPLANT
COVER BACK TABLE 24X17X13 BIG (DRAPES) ×3 IMPLANT
CRADLE DONUT ADULT HEAD (MISCELLANEOUS) ×3 IMPLANT
DERMABOND ADVANCED (GAUZE/BANDAGES/DRESSINGS) ×1
DERMABOND ADVANCED .7 DNX12 (GAUZE/BANDAGES/DRESSINGS) ×4 IMPLANT
DEVICE PMI PUNCTURE CLOSURE (MISCELLANEOUS) ×3 IMPLANT
DEVICE SUT CK QUICK LOAD MINI (Prosthesis & Implant Heart) ×2 IMPLANT
DEVICE TROCAR PUNCTURE CLOSURE (ENDOMECHANICALS) ×4 IMPLANT
DRAIN CHANNEL 28F RND 3/8 FF (WOUND CARE) ×6 IMPLANT
DRAPE BILATERAL SPLIT (DRAPES) ×3 IMPLANT
DRAPE C-ARM 42X72 X-RAY (DRAPES) ×3 IMPLANT
DRAPE CV SPLIT W-CLR ANES SCRN (DRAPES) ×3 IMPLANT
DRAPE INCISE IOBAN 66X45 STRL (DRAPES) ×9 IMPLANT
DRAPE SLUSH/WARMER DISC (DRAPES) ×3 IMPLANT
DRSG COVADERM 4X10 (GAUZE/BANDAGES/DRESSINGS) ×1 IMPLANT
DRSG COVADERM 4X8 (GAUZE/BANDAGES/DRESSINGS) ×3 IMPLANT
ELECT BLADE 6.5 EXT (BLADE) ×3 IMPLANT
ELECT REM PT RETURN 9FT ADLT (ELECTROSURGICAL) ×6
ELECTRODE REM PT RTRN 9FT ADLT (ELECTROSURGICAL) ×4 IMPLANT
FELT TEFLON 1X6 (MISCELLANEOUS) ×5 IMPLANT
FEMORAL VENOUS CANN RAP (CANNULA) IMPLANT
GAUZE SPONGE 4X4 12PLY STRL (GAUZE/BANDAGES/DRESSINGS) ×1 IMPLANT
GLOVE BIOGEL PI IND STRL 6.5 (GLOVE) IMPLANT
GLOVE BIOGEL PI INDICATOR 6.5 (GLOVE) ×2
GLOVE ORTHO TXT STRL SZ7.5 (GLOVE) ×9 IMPLANT
GOWN STRL REUS W/ TWL LRG LVL3 (GOWN DISPOSABLE) ×8 IMPLANT
GOWN STRL REUS W/TWL LRG LVL3 (GOWN DISPOSABLE) ×24
KIT BASIN OR (CUSTOM PROCEDURE TRAY) ×3 IMPLANT
KIT DILATOR VASC 18G NDL (KITS) ×3 IMPLANT
KIT DRAINAGE VACCUM ASSIST (KITS) ×1 IMPLANT
KIT ROOM TURNOVER OR (KITS) ×3 IMPLANT
KIT SUCTION CATH 14FR (SUCTIONS) ×3 IMPLANT
KIT SUT CK MINI COMBO 4X17 (Prosthesis & Implant Heart) ×1 IMPLANT
LEAD PACING MYOCARDI (MISCELLANEOUS) ×3 IMPLANT
LINE VENT (MISCELLANEOUS) ×1 IMPLANT
NDL AORTIC ROOT 14G 7F (CATHETERS) ×2 IMPLANT
NEEDLE AORTIC ROOT 14G 7F (CATHETERS) ×3 IMPLANT
NS IRRIG 1000ML POUR BTL (IV SOLUTION) ×16 IMPLANT
PACK OPEN HEART (CUSTOM PROCEDURE TRAY) ×3 IMPLANT
PAD ARMBOARD 7.5X6 YLW CONV (MISCELLANEOUS) ×6 IMPLANT
PAD ELECT DEFIB RADIOL ZOLL (MISCELLANEOUS) ×3 IMPLANT
PATCH CORMATRIX 4CMX7CM (Prosthesis & Implant Heart) ×1 IMPLANT
RING ANLPLS CARP-EDW PHY II 34 (Prosthesis & Implant Heart) IMPLANT
RING ANNULOPLASTY PHY II (Prosthesis & Implant Heart) ×3 IMPLANT
RING HOLDER ANNULOPLASTY (MISCELLANEOUS) ×1 IMPLANT
SET CANNULATION TOURNIQUET (MISCELLANEOUS) ×3 IMPLANT
SET CARDIOPLEGIA MPS 5001102 (MISCELLANEOUS) ×1 IMPLANT
SET IRRIG TUBING LAPAROSCOPIC (IRRIGATION / IRRIGATOR) ×3 IMPLANT
SOLUTION ANTI FOG 6CC (MISCELLANEOUS) ×3 IMPLANT
SPONGE GAUZE 4X4 12PLY STER LF (GAUZE/BANDAGES/DRESSINGS) ×3 IMPLANT
STOPCOCK 4 WAY LG BORE MALE ST (IV SETS) ×1 IMPLANT
SUT BONE WAX W31G (SUTURE) ×3 IMPLANT
SUT E-PACK MINIMALLY INVASIVE (SUTURE) ×3 IMPLANT
SUT ETHIBOND (SUTURE) ×2 IMPLANT
SUT ETHIBOND 2 0 SH (SUTURE) ×1 IMPLANT
SUT ETHIBOND 2-0 RB-1 WHT (SUTURE) ×2 IMPLANT
SUT ETHIBOND X763 2 0 SH 1 (SUTURE) ×3 IMPLANT
SUT GORETEX CV 4 TH 22 36 (SUTURE) ×3 IMPLANT
SUT GORETEX CV4 TH-18 (SUTURE) ×6 IMPLANT
SUT PROLENE 3 0 SH1 36 (SUTURE) ×12 IMPLANT
SUT PTFE CHORD X 24MM (SUTURE) ×1 IMPLANT
SUT SILK  1 MH (SUTURE) ×2
SUT SILK 1 MH (SUTURE) IMPLANT
SUT SILK 2 0 SH CR/8 (SUTURE) IMPLANT
SUT SILK 3 0 SH CR/8 (SUTURE) IMPLANT
SUT VIC AB 2-0 CTX 36 (SUTURE) IMPLANT
SUT VIC AB 3-0 SH 8-18 (SUTURE) ×1 IMPLANT
SUT VICRYL 2 TP 1 (SUTURE) IMPLANT
SYRINGE 10CC LL (SYRINGE) ×3 IMPLANT
SYSTEM SAHARA CHEST DRAIN ATS (WOUND CARE) ×3 IMPLANT
TOWEL OR 17X24 6PK STRL BLUE (TOWEL DISPOSABLE) ×6 IMPLANT
TOWEL OR 17X26 10 PK STRL BLUE (TOWEL DISPOSABLE) ×6 IMPLANT
TRAY FOLEY IC TEMP SENS 16FR (CATHETERS) ×3 IMPLANT
TROCAR XCEL BLADELESS 5X75MML (TROCAR) ×3 IMPLANT
TROCAR XCEL NON-BLD 11X100MML (ENDOMECHANICALS) ×6 IMPLANT
TUBE SUCT INTRACARD DLP 20F (MISCELLANEOUS) ×3 IMPLANT
TUNNELER SHEATH ON-Q 11GX8 DSP (PAIN MANAGEMENT) ×1 IMPLANT
UNDERPAD 30X30 (UNDERPADS AND DIAPERS) ×3 IMPLANT
WATER STERILE IRR 1000ML POUR (IV SOLUTION) ×6 IMPLANT
WIRE .035 3MM-J 145CM (WIRE) ×3 IMPLANT

## 2016-10-27 NOTE — Procedures (Signed)
Extubation Procedure Note  Patient Details:   Name: Leonard Bentley Seat DOB: 1961/11/04 MRN: [id number]   Airway Documentation:     Evaluation  O2 sats: stable throughout Complications: No apparent complications Patient did tolerate procedure well. Bilateral Breath Sounds: Diminished, Clear   Yes  Estill Bamberg 10/27/2016, 8:53 PM

## 2016-10-27 NOTE — CV Procedure (Signed)
Intra-operative Transesophageal Echocardiography Report:  Leonard Bentley is a 55 year old male with a history of non-Hodgkin's lymphoma treated in 2012 with chemotherapy, recurrent pulmonary embolism on long-term anticoagulation, and Mobitz type I second-degree AV block  with nonconducted PACs associated with palpitations. He was found to have mitral valve prolapse and severe mitral regurgitation and is now scheduled to undergo elective mitral valve repair by Dr. Roxy Manns. Previous left heart catheterization showed no significant coronary artery disease and normal pulmonary artery pressures. Intraoperative transesophageal echocardiography was indicated to evaluate the mitral valve and assist with the repair, to assess for any other valvular pathology, and to serve as a monitor for right and left ventricular function and intraoperative volume status.   The patient was brought to the operating room at [facility] and general anesthesia was induced without difficulty. Following endotracheal intubation and orogastric suctioning, the transesophageal echocardiography probe was inserted into the esophagus without difficulty.  Impression: Pre-bypass findings:  1. Aortic valve: The aortic valve was trileaflet. The leaflets were thin and pliable and opened normally without restriction. There was no aortic insufficiency.  2. Mitral valve: There was moderate bileaflet thickening. There was redundancy of the leaflets which resulted in billowing of the leaflets upon coaptation. It appeared that the posterior leaflet was overriding the anterior leaflet in some views. There was a jet of mitral regurgitation which was anteriorly directed and another jet which was centrally directed. There was some suggestion in some views of  a possible small flail segment of the P2 scallop of the posterior leaflet. This could not be conclusively confirmed. The degree of mitral regurgitation was graded as moderate to severe. The vena  contracta measured 0.72 cm and the right and left upper pulmonary vein PW Doppler tracings showed blunting of the systolic component of flow.  3. Left ventricle: The left ventricular cavity was dilated and measured 6.42 cm at end diastole at the mid-papillary level in the transgastric short axis view. Left ventricular wall thickness was normal. The posterior wall measured 0.78 cm in the anterior wall measured 0.76 cm at end diastole at the mid-papillary level transgastric short axis view. There was normal appearing systolic function with an ejection fraction was estimated 55-60%. No regional wall motion abnormalities were appreciated.   4. Right ventricle: The right ventricular cavity was moderately dilated. There was decreased contractility of the right ventricular free wall and infundibulum consistent with moderate right ventricular dysfunction.  5. Tricuspid valve: Tricuspid valve appeared structurally normal and there was trace tricuspid insufficiency.  6. Interatrial septum: Interatrial septum was intact without evidence of patent foramen ovale or atrial septal defect by color Doppler and bubble study.   7. Left atrium: There was no thrombus noted within the left atrium or left atrial appendage.   8. The ascending aorta: There was a well-defined aortic root and sinotubular ridge without dilatation or effacement. The proximal ascending aorta measured 3.38 cm. The aortic root at the sinotubular ridge measured 3.61 cm and at the sinuses of Valsalva measured 3.67 cm. There was no atheromatous disease noted in the ascending aorta.   9. Descending aorta: The descending aorta appeared normal and measured 2.4 cm in diameter. There was no atheromatous disease noted.  Post-bypass findings:  1. Aortic valve: The aortic valve was unchanged from the pre-bypass study. The leaflets were thin and pliable and opened normally without restriction. There was no aortic insufficiency.  2. Mitral valve: There was  an annuloplasty ring in the mitral position. The mean transmitral gradient was  3 mmHg.   3. Left ventricle: Left ventricular cavity was reduced in size and measured 5.1 cm at end diastole at the mid-papillary level in the transgastric view. There was normal appearing systolic function and the ejection fraction was estimated at 55-60%. There were no regional wall motion abnormalities detected.   4. Right ventricle: The right ventricular cavity again appeared moderately mild to moderately enlarged. There was  reduced contractility the right ventricular free wall and infundibulum which appeared unchanged from the pre-bypass study.   5. Tricuspid valve: The tricuspid valve appeared structurally normal with trace tricuspid insufficiency.    Roberts Gaudy, M.D.

## 2016-10-27 NOTE — Procedures (Signed)
NIF (-45) and VC (1.5L) obtain from pt with great effort.

## 2016-10-27 NOTE — Brief Op Note (Signed)
10/27/2016  1:32 PM  PATIENT:  Leonard Bentley  55 y.o. 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

## 2016-10-27 NOTE — Anesthesia Preprocedure Evaluation (Signed)
Anesthesia Evaluation    Airway        Dental   Pulmonary former smoker,          Cardiovascular     Neuro/Psych    GI/Hepatic   Endo/Other    Renal/GU      Musculoskeletal   Abdominal   Peds  Hematology   Anesthesia Other Findings   Reproductive/Obstetrics                             Anesthesia Physical Anesthesia Plan Anesthesia Quick Evaluation  

## 2016-10-27 NOTE — Anesthesia Procedure Notes (Signed)
Central Venous Catheter Insertion Performed by: Roberts Gaudy, anesthesiologist Start/End12/20/2017 9:09 AM, 10/27/2016 9:15 AM Patient location: Pre-op. Preanesthetic checklist: patient identified, IV checked, site marked, risks and benefits discussed, surgical consent, monitors and equipment checked, pre-op evaluation, timeout performed and anesthesia consent Position: Trendelenburg Hand hygiene performed  and maximum sterile barriers used  Catheter size: 8.5 Fr PA cath was placed.Sheath introducer Swan type:thermodilution PA Cath depth:45 Procedure performed without using ultrasound guided technique. Attempts: 1 Following insertion, line sutured, dressing applied and Biopatch. Post procedure assessment: blood return through all ports, free fluid flow and no air  Patient tolerated the procedure well with no immediate complications.

## 2016-10-27 NOTE — Anesthesia Preprocedure Evaluation (Addendum)
Anesthesia Evaluation  Patient identified by MRN, date of birth, ID band Patient awake    Reviewed: Allergy & Precautions, NPO status , Patient's Chart, lab work & pertinent test results  Airway Mallampati: II  TM Distance: >3 FB Neck ROM: Full    Dental  (+) Teeth Intact, Dental Advisory Given   Pulmonary former smoker,    breath sounds clear to auscultation       Cardiovascular  Rhythm:Regular Rate:Normal     Neuro/Psych    GI/Hepatic   Endo/Other    Renal/GU      Musculoskeletal   Abdominal   Peds  Hematology   Anesthesia Other Findings   Reproductive/Obstetrics                             Anesthesia Physical Anesthesia Plan  ASA: III  Anesthesia Plan: General   Post-op Pain Management:    Induction: Intravenous  Airway Management Planned: Double Lumen EBT  Additional Equipment: Arterial line  Intra-op Plan:   Post-operative Plan: Extubation in OR  Informed Consent: I have reviewed the patients History and Physical, chart, labs and discussed the procedure including the risks, benefits and alternatives for the proposed anesthesia with the patient or authorized representative who has indicated his/her understanding and acceptance.   Dental advisory given  Plan Discussed with: CRNA and Anesthesiologist  Anesthesia Plan Comments:        Anesthesia Quick Evaluation

## 2016-10-27 NOTE — Anesthesia Procedure Notes (Signed)
Procedure Name: Intubation Date/Time: 10/27/2016 8:46 AM Performed by: Rebekah Chesterfield L Pre-anesthesia Checklist: Patient identified, Emergency Drugs available, Suction available, Patient being monitored and Timeout performed Patient Re-evaluated:Patient Re-evaluated prior to inductionOxygen Delivery Method: Circle system utilized Preoxygenation: Pre-oxygenation with 100% oxygen Intubation Type: IV induction Ventilation: Oral airway inserted - appropriate to patient size and Mask ventilation with difficulty Laryngoscope Size: Mac and 4 Grade View: Grade I Endobronchial tube: EBT position confirmed by auscultation, EBT position confirmed by fiberoptic bronchoscope and Double lumen EBT and 39 Fr Number of attempts: 1 Airway Equipment and Method: Stylet and Fiberoptic brochoscope Placement Confirmation: ETT inserted through vocal cords under direct vision,  positive ETCO2 and breath sounds checked- equal and bilateral Dental Injury: Teeth and Oropharynx as per pre-operative assessment

## 2016-10-27 NOTE — OR Nursing (Signed)
Twenty minute call to SICU charge nurse at 1326. Spoke to Leonard Bentley.

## 2016-10-27 NOTE — Progress Notes (Signed)
CT surgery p.m. Rounds  Stable after mitral valve repair Sinus rhythm atrially paced Cardiac index 1.9 Patient comfortable on ventilator with adequate oxygenation

## 2016-10-27 NOTE — Interval H&P Note (Signed)
History and Physical Interval Note:  10/27/2016 6:19 AM  Leonard Bentley  has presented today for surgery, with the diagnosis of MR  The various methods of treatment have been discussed with the patient and family. After consideration of risks, benefits and other options for treatment, the patient has consented to  Procedure(s): MINIMALLY INVASIVE MITRAL VALVE REPAIR (MVR) (Right) TRANSESOPHAGEAL ECHOCARDIOGRAM (TEE) (N/A) as a surgical intervention .  The patient's history has been reviewed, patient examined, no change in status, stable for surgery.  I have reviewed the patient's chart and labs.  Questions were answered to the patient's satisfaction.     [name]

## 2016-10-27 NOTE — Op Note (Signed)
CARDIOTHORACIC SURGERY OPERATIVE NOTE  Date of Procedure:  10/27/2016  Preoperative Diagnosis: Severe Mitral Regurgitation  Postoperative Diagnosis: Same  Procedure:    Minimally-Invasive Mitral Valve Repair  Complex valvuloplasty including artificial Gore-tex neochord placement x6  Edwards Physio II Ring Annuloplasty (size 109mm, model # [id number], serial # [id number])    Surgeon: Valentina Gu. 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  Bileaflet billowing and prolapse with severe prolapse of P2 segment of posterior leaflet  Type II dysfunction with severe mitral regurgitation  LV chamber enlargement with normal LV systolic function  No residual mitral regurgitation after successful valve repair                BRIEF CLINICAL NOTE AND INDICATIONS FOR SURGERY  Patient is a 55 year old male with history of mitral valve prolapse and severe mitral regurgitation, stage II large B cell non-Hodgkin's lymphoma diagnosed in 2012 treated with systemic chemotherapy, recurrent pulmonary embolism on long-term anticoagulation, and Mobitz type I second-degree AV block with frequent non-conducted PAC's associated with symptoms of palpitationswho returns to the office today for follow-up of severe mitral regurgitation. He was originally seen in consultation on 02/05/2016 after he had undergone transesophageal echocardiogram demonstrating the presence of mitral valve prolapse with severe mitral regurgitation. He underwent CT angiogram of the chest, abdomen, and pelvis as part of his routine preoperative workup for possible elective namely invasive mitral valve repair. He was found to have a recurrent pulmonary embolus and was placed back on anticoagulation using Xarelto, and plans for elective mitral valve repair were postponed. He was seen in follow-up recently by Dr. Oval Linsey  and reportedly doing quite well, although the patient does complain of mild exertional shortness of breath associated with more strenuous exertion. The patient exercises routinely and has noticed a significant change in his exercise tolerance. Palpitations have been much improved on beta blocker therapy. The patient has been seen in consultation and counseled at length regarding the indications, risks and potential benefits of surgery.  All questions have been answered, and the patient provides full informed consent for the operation as described.   DETAILS OF THE OPERATIVE PROCEDURE  Preparation:  The patient is brought to the operating room on the above mentioned date and central monitoring was established by the anesthesia team including placement of Swan-Ganz catheter through the left internal jugular vein.  A radial arterial line is placed. The patient is placed in the supine position on the operating table.  Intravenous antibiotics are administered. General endotracheal anesthesia is induced uneventfully. The patient is initially intubated using a dual lumen endotracheal tube.  A Foley catheter is placed.  Baseline transesophageal echocardiogram was performed.  Findings were notable for Bileaflet prolapse of the mitral valve with severe prolapse involving a small portion of the middle scallop of the posterior leaflet. There was an eccentric jet of mitral regurgitation directed anteriorly. There was severe mitral regurgitation. There was suggestion of possible ruptured chordae tendineae but no obvious flail segment. There was no calcification. The left ventricle was dilated. There was normal left ventricular systolic function. The aortic valve appeared normal. No other abnormalities were noted.  A soft roll is placed behind the patient's left scapula and the neck gently extended and turned to the left.   The patient's right neck, chest, abdomen, both groins, and both lower extremities are prepared  and draped in a sterile manner. A time out procedure  is performed.  Surgical Approach:  A right miniature anterolateral thoracotomy incision is performed. The incision is placed just lateral to and superior to the right nipple. The pectoralis major muscle is retracted medially and completely preserved. The right pleural space is entered through the 3rd intercostal space. A soft tissue retractor is placed.  Two 11 mm ports are placed through separate stab incisions inferiorly. The right pleural space is insufflated continuously with carbon dioxide gas through the posterior port during the remainder of the operation.  A pledgeted sutures placed through the dome of the right hemidiaphragm and retracted inferiorly to facilitate exposure.  A longitudinal incision is made in the pericardium 3 cm anterior to the phrenic nerve and silk traction sutures are placed on either side of the incision for exposure.   Extracorporeal Cardiopulmonary Bypass and Myocardial Protection:  A small incision is made in the right inguinal crease and the anterior surface of the right common femoral artery and right common femoral vein are identified.  The patient is placed in Trendelenburg position. The right internal jugular vein is cannulated with Seldinger technique and a guidewire advanced into the right atrium. The patient is heparinized systemically. The right internal jugular vein is cannulated with a 14 [place] pediatric femoral venous cannula. Pursestring sutures are placed on the anterior surface of the right common femoral vein and right common femoral artery. The right common femoral vein is cannulated with the Seldinger technique and a guidewire is advanced under transesophageal echocardiogram guidance through the right atrium. The femoral vein is cannulated with a long 22 French femoral venous cannula. The right common femoral artery is cannulated with Seldinger technique and a flexible guidewire is advanced until it can  be appreciated intraluminally in the descending thoracic aorta on transesophageal echocardiogram. The femoral artery is cannulated with an 18 French femoral arterial cannula.  Adequate heparinization is verified.     The entire pre-bypass portion of the operation was notable for stable hemodynamics.  Cardiopulmonary bypass was begun.  Vacuum assist venous drainage is utilized. The incision in the pericardium is extended in both directions. Venous drainage and exposure are notably excellent. A retrograde cardioplegia cannula is placed through the right atrium into the coronary sinus using transesophageal echocardiogram guidance.  An antegrade cardioplegia cannula is placed in the ascending aorta.    The patient is cooled to 28C systemic temperature.  The aortic cross clamp is applied and cold blood cardioplegia is delivered initially in an antegrade fashion through the aortic root.   Supplemental cardioplegia is given retrograde through the coronary sinus catheter. The initial cardioplegic arrest is rapid with early diastolic arrest.  Repeat doses of cardioplegia are administered intermittently every 20 to 30 minutes throughout the entire cross clamp portion of the operation through the aortic root and through the coronary sinus catheter in order to maintain completely flat electrocardiogram.  Myocardial protection was felt to be excellent.   Mitral Valve Repair:  A left atriotomy incision was performed through the interatrial groove and extended partially across the back wall of the left atrium after opening the oblique sinus inferiorly.  The mitral valve is exposed using a self-retaining retractor.  The mitral valve was inspected and notable for forme fruste variant of Barlow's type myxomatous degenerative disease.  The mitral valve was relatively large with somewhat redundant leaflet tissue. There was Bileaflet billowing with severe prolapse involving a portion of the middle scallop of the posterior  leaflet. There were no ruptured chordae tendineae but there were several elongated  chordae tendineae. There was no calcification. The annulus was dilated.  Interrupted 2-0 Ethibond horizontal mattress sutures are placed circumferentially around the entire mitral valve annulus. The sutures will ultimately be utilized for ring annuloplasty, and at this juncture there are utilized to suspend the valve symmetrically.  Artificial neochord placement was performed using Chord-X multi-strand CV-4 Goretex pre-measured loops.  The appropriate cord length was measured from corresponding normal length primary cords from the P1 segment of the posterior leaflet. The papillary muscle suture of the Chord-X multi-strand suture was placed through the head of the posterior papillary muscle in a horizontal mattress fashion and tied over Teflon felt pledgets. Each of the three pre-measured loops were then reimplanted into the free margin of the P2 segment of the posterior leaflet.    The valve was tested with saline and appeared competent even without ring annuloplasty complete. The valve was sized to a 34 mm annuloplasty ring, based upon the transverse distance between the left and right commissures and the height of the anterior leaflet, corresponding to a size just slightly larger than the overall surface area of the anterior leaflet.  An Edwards Physio II annuloplasty ring (size 71mm, model #5200, serial [id number]) was secured in place uneventfully. All ring sutures were secured using a Cor-knot device.  The valve was tested with saline and appeared competent.   The valve is again tested with saline and appears to be perfectly competent with a broad symmetrical line of coaptation of the anterior and posterior leaflet. There is no residual leak. There was a broad, symmetrical line of coaptation of the anterior and posterior leaflet which was confirmed using the blue ink test.  Rewarming is begun.   Procedure  Completion:  The atriotomy was closed using a 2-layer closure of running 3-0 Prolene suture after placing a sump drain across the mitral valve to serve as a left ventricular vent.  One final dose of warm retrograde "hot shot" cardioplegia was administered retrograde through the coronary sinus catheter while all air was evacuated through the aortic root.  The aortic cross clamp was removed after a total cross clamp time of 91 minutes.  Epicardial pacing wires are fixed to the inferior wall of the right ventricule and to the right atrial appendage. The patient is rewarmed to 37C temperature. The left ventricular vent is removed.  The patient is ventilated and flow volumes turndown while the mitral valve repair is inspected using transesophageal echocardiogram. The valve repair appears intact with no residual leak. The antegrade cardioplegia cannula is now removed. The patient is weaned and disconnected from cardiopulmonary bypass.  The patient's rhythm at separation from bypass was sinus.  The patient was weaned from bypass without any inotropic support. Total cardiopulmonary bypass time for the operation was 127 minutes.  Followup transesophageal echocardiogram performed after separation from bypass revealed a well-seated annuloplasty ring in the mitral position with a normal functioning mitral valve. There was no residual leak.  Left ventricular function was unchanged from preoperatively.  The mean gradient across the mitral valve was estimated to be 3 mmHg.  The femoral arterial and venous cannulae were removed uneventfully. There was a palpable pulse in the distal right common femoral artery after removal of the cannula. Protamine was administered to reverse the anticoagulation. The right internal jugular cannula was removed and manual pressure held on the neck for 15 minutes.  Single lung ventilation was begun. The atriotomy closure was inspected for hemostasis. The pericardial sac was drained using a 28  [place]  Bard drain placed through the anterior port incision.  The pericardium was closed using a patch of core matrix bovine submucosal tissue patch. The right pleural space is irrigated with saline solution and inspected for hemostasis. The right pleural space was drained using a 28 French Bard drain placed through the posterior port incision. The miniature thoracotomy incision was closed in multiple layers in routine fashion. The right groin incision was inspected for hemostasis and closed in multiple layers in routine fashion.  The post-bypass portion of the operation was notable for stable rhythm and hemodynamics.  No blood products were administered during the operation.   Disposition:  The patient tolerated the procedure well.  The patient was reintubated using a single lumen endotracheal tube and subsequently transported to the surgical intensive care unit in stable condition. There were no intraoperative complications. All sponge instrument and needle counts are verified correct at completion of the operation.     Valentina Gu. Roxy Manns MD 10/27/2016 1:38 PM

## 2016-10-27 NOTE — OR Nursing (Signed)
Forty-five minute call to SICU charge nurse at 1305. Spoke to [place].

## 2016-10-27 NOTE — Anesthesia Procedure Notes (Signed)
Procedure Name: Intubation Date/Time: 10/27/2016 1:53 PM Performed by: Greggory Stallion, [place] L Pre-anesthesia Checklist: Patient identified, Emergency Drugs available, Suction available and Patient being monitored Patient Re-evaluated:Patient Re-evaluated prior to inductionOxygen Delivery Method: Circle System Utilized Preoxygenation: Pre-oxygenation with 100% oxygen Laryngoscope Size: Mac and 4 Grade View: Grade I Tube type: Oral Tube size: 8.0 mm Number of attempts: 1 Airway Equipment and Method: Stylet Placement Confirmation: ETT inserted through vocal cords under direct vision,  positive ETCO2 and breath sounds checked- equal and bilateral Secured at: 22 cm Tube secured with: Tape Dental Injury: Teeth and Oropharynx as per pre-operative assessment

## 2016-10-27 NOTE — Progress Notes (Signed)
Dr. Roxy Manns paged about pt's CO continuing to not meet protocol parameters despite Albumin given x3. Orders received for another albumin and 1 gram of Calcium Chloride mixed in.  MD to come to bedside to address need for additional albumin.  Orders carried out. Will continue to monitor pt closely.

## 2016-10-27 NOTE — Anesthesia Procedure Notes (Addendum)
Central Venous Catheter Insertion Performed by: Roderic Palau, anesthesiologist Start/End12/20/2017 7:50 AM, 10/27/2016 8:00 AM Patient location: Pre-op. Preanesthetic checklist: patient identified, IV checked, site marked, risks and benefits discussed, surgical consent, monitors and equipment checked, pre-op evaluation, timeout performed and anesthesia consent Position: Trendelenburg Lidocaine 1% used for infiltration and patient sedated Hand hygiene performed , maximum sterile barriers used  and Seldinger technique used Catheter size: 9 Fr Total catheter length 10. Central line and PA cath was placed.MAC introducer Swan type:thermodilution PA Cath depth:50 Procedure performed using ultrasound guided technique. Ultrasound Notes:anatomy identified, needle tip was noted to be adjacent to the nerve/plexus identified, no ultrasound evidence of intravascular and/or intraneural injection and image(s) printed for medical record Attempts: 1 Following insertion, line sutured and dressing applied. Post procedure assessment: blood return through all ports, free fluid flow and no air  Patient tolerated the procedure well with no immediate complications.

## 2016-10-27 NOTE — Transfer of Care (Signed)
Immediate Anesthesia Transfer of Care Note  Patient: Leonard Bentley  Procedure(s) Performed: Procedure(s): MINIMALLY INVASIVE MITRAL VALVE REPAIR (MVR) USING 34 PHYSIO II ANNULOPLASTY RING (Right) TRANSESOPHAGEAL ECHOCARDIOGRAM (TEE) (N/A)  Patient Location: SICU  Anesthesia Type:General  Level of Consciousness: unresponsive and Patient remains intubated per anesthesia plan  Airway & Oxygen Therapy: Patient remains intubated per anesthesia plan and Patient placed on Ventilator (see vital sign flow sheet for setting)  Post-op Assessment: Report given to RN and Post -op Vital signs reviewed and stable  Post vital signs: Reviewed and stable  Last Vitals:  Vitals:   10/27/16 0640  BP: 129/83  Pulse: 72  Resp: 20  Temp: 36.2 C    Last Pain: There were no vitals filed for this visit.       Complications: No apparent anesthesia complications

## 2016-10-28 ENCOUNTER — Encounter (HOSPITAL_COMMUNITY): Payer: Self-pay | Admitting: Thoracic Surgery (Cardiothoracic Vascular Surgery)

## 2016-10-28 ENCOUNTER — Inpatient Hospital Stay (HOSPITAL_COMMUNITY): Payer: No Typology Code available for payment source

## 2016-10-28 LAB — POCT I-STAT 4, (NA,K, GLUC, HGB,HCT)
Glucose, Bld: 100 mg/dL — ABNORMAL HIGH (ref 65–99)
HCT: 35 % — ABNORMAL LOW (ref 39.0–52.0)
Hemoglobin: 11.9 g/dL — ABNORMAL LOW (ref 13.0–17.0)
Potassium: 3.5 mmol/L (ref 3.5–5.1)
Sodium: 143 mmol/L (ref 135–145)

## 2016-10-28 LAB — POCT I-STAT, CHEM 8
BUN: 8 mg/dL (ref 6–20)
Calcium, Ion: 1.23 mmol/L (ref 1.15–1.40)
Chloride: 101 mmol/L (ref 101–111)
Creatinine, Ser: 1 mg/dL (ref 0.61–1.24)
Glucose, Bld: 104 mg/dL — ABNORMAL HIGH (ref 65–99)
HCT: 32 % — ABNORMAL LOW (ref 39.0–52.0)
Hemoglobin: 10.9 g/dL — ABNORMAL LOW (ref 13.0–17.0)
Potassium: 4.1 mmol/L (ref 3.5–5.1)
Sodium: 138 mmol/L (ref 135–145)
TCO2: 25 mmol/L (ref 0–100)

## 2016-10-28 LAB — BASIC METABOLIC PANEL
Anion gap: 4 — ABNORMAL LOW (ref 5–15)
BUN: 6 mg/dL (ref 6–20)
CO2: 23 mmol/L (ref 22–32)
Calcium: 8.3 mg/dL — ABNORMAL LOW (ref 8.9–10.3)
Chloride: 111 mmol/L (ref 101–111)
Creatinine, Ser: 0.86 mg/dL (ref 0.61–1.24)
GFR calc Af Amer: >60 mL/min (ref >60)
GFR calc non Af Amer: >60 mL/min (ref >60)
Glucose, Bld: 106 mg/dL — ABNORMAL HIGH (ref 65–99)
Potassium: 4.4 mmol/L (ref 3.5–5.1)
Sodium: 138 mmol/L (ref 135–145)

## 2016-10-28 LAB — CBC
HCT: 28 % — ABNORMAL LOW (ref 39.0–52.0)
HCT: 28.7 % — ABNORMAL LOW (ref 39.0–52.0)
Hemoglobin: 8.9 g/dL — ABNORMAL LOW (ref 13.0–17.0)
Hemoglobin: 9.2 g/dL — ABNORMAL LOW (ref 13.0–17.0)
MCH: 26.6 pg (ref 26.0–34.0)
MCH: 27 pg (ref 26.0–34.0)
MCHC: 31.8 g/dL (ref 30.0–36.0)
MCHC: 32.1 g/dL (ref 30.0–36.0)
MCV: 83.8 fL (ref 78.0–100.0)
MCV: 84.2 fL (ref 78.0–100.0)
Platelets: 121 10*3/uL — ABNORMAL LOW (ref 150–400)
Platelets: 112 10*3/uL — ABNORMAL LOW (ref 150–400)
RBC: 3.34 MIL/uL — ABNORMAL LOW (ref 4.22–5.81)
RBC: 3.41 MIL/uL — ABNORMAL LOW (ref 4.22–5.81)
RDW: 15.9 % — ABNORMAL HIGH (ref 11.5–15.5)
RDW: 16 % — ABNORMAL HIGH (ref 11.5–15.5)
WBC: 4.1 10*3/uL (ref 4.0–10.5)
WBC: 6.5 10*3/uL (ref 4.0–10.5)

## 2016-10-28 LAB — GLUCOSE, CAPILLARY
Glucose-Capillary: 102 mg/dL — ABNORMAL HIGH (ref 65–99)
Glucose-Capillary: 108 mg/dL — ABNORMAL HIGH (ref 65–99)
Glucose-Capillary: 111 mg/dL — ABNORMAL HIGH (ref 65–99)
Glucose-Capillary: 124 mg/dL — ABNORMAL HIGH (ref 65–99)
Glucose-Capillary: 146 mg/dL — ABNORMAL HIGH (ref 65–99)
Glucose-Capillary: 152 mg/dL — ABNORMAL HIGH (ref 65–99)
Glucose-Capillary: 95 mg/dL (ref 65–99)

## 2016-10-28 LAB — MAGNESIUM
Magnesium: 1.8 mg/dL (ref 1.7–2.4)
Magnesium: 1.7 mg/dL (ref 1.7–2.4)

## 2016-10-28 LAB — CREATININE, SERUM
Creatinine, Ser: 1.01 mg/dL (ref 0.61–1.24)
GFR calc Af Amer: >60 mL/min (ref >60)
GFR calc non Af Amer: >60 mL/min (ref >60)

## 2016-10-28 MED ORDER — LORAZEPAM 0.5 MG PO TABS
0.5000 mg | ORAL_TABLET | Freq: Four times a day (QID) | ORAL | Status: DC | PRN
Start: 2016-10-28 — End: 2016-10-31
  Administered 2016-10-28 (×2): 0.5 mg via ORAL
  Filled 2016-10-28 (×2): qty 1

## 2016-10-28 MED ORDER — LACTATED RINGERS IV SOLN: INTRAVENOUS | Status: DC

## 2016-10-28 MED ORDER — FUROSEMIDE 10 MG/ML IJ SOLN
20.0000 mg | Freq: Two times a day (BID) | INTRAMUSCULAR | Status: AC
  Administered 2016-10-28 – 2016-10-29 (×3): 20 mg via INTRAVENOUS
  Filled 2016-10-28 (×3): qty 2

## 2016-10-28 MED ORDER — KETOROLAC TROMETHAMINE 15 MG/ML IJ SOLN
15.0000 mg | Freq: Four times a day (QID) | INTRAMUSCULAR | Status: AC
  Administered 2016-10-28 – 2016-10-29 (×5): 15 mg via INTRAVENOUS
  Filled 2016-10-28 (×5): qty 1

## 2016-10-28 MED ORDER — ENOXAPARIN SODIUM 30 MG/0.3ML ~~LOC~~ SOLN
30.0000 mg | SUBCUTANEOUS | Status: DC
  Administered 2016-10-29 – 2016-10-30 (×2): 30 mg via SUBCUTANEOUS
  Filled 2016-10-28 (×2): qty 0.3

## 2016-10-28 MED ORDER — WARFARIN SODIUM 2.5 MG PO TABS
2.5000 mg | ORAL_TABLET | Freq: Every day | ORAL | Status: DC
  Administered 2016-10-28: 2.5 mg via ORAL
  Filled 2016-10-28: qty 1

## 2016-10-28 MED ORDER — WARFARIN - PHYSICIAN DOSING INPATIENT: Freq: Every day | Status: DC

## 2016-10-28 NOTE — Progress Notes (Signed)
[address]       [place] [address]             [phone]        CARDIOTHORACIC SURGERY PROGRESS NOTE   R1 Reznik Post-Op Procedure(s) (LRB): MINIMALLY INVASIVE MITRAL VALVE REPAIR (MVR) USING 34 PHYSIO II ANNULOPLASTY RING (Right) TRANSESOPHAGEAL ECHOCARDIOGRAM (TEE) (N/A)  Subjective: Looks good and feels well.  Mild soreness in chest.  No SOB.  Hungry.  Objective: Vital signs: BP Readings from Last 1 Encounters:  10/28/16 102/77   Pulse Readings from Last 1 Encounters:  10/28/16 (!) 126   Resp Readings from Last 1 Encounters:  10/28/16 (!) 25   Temp Readings from Last 1 Encounters:  10/28/16 99 F (37.2 C)    Hemodynamics: PAP: (18-46)/(10-24) 39/12 CO:  [2.7 L/min-5 L/min] 4.6 L/min CI:  [1.4 L/min/m2-2.5 L/min/m2] 2.3 L/min/m2  Physical Exam:  Rhythm:   sinus  Breath sounds: clear  Heart sounds:  RRR  Incisions:  Dressings dry, intact  Abdomen:  Soft, non-distended, non-tender  Extremities:  Warm, well-perfused  Chest tubes:  low volume thin serosanguinous output, no air leak    Intake/Output from previous [place]: 12/20 0701 - 12/21 0700 In: 7714.8 [I.V.:4593.8; Blood:846; IV Piggyback:2275] Out: [id number] [Urine:4530; Blood:1200; Chest Tube:790] Intake/Output this shift: Total I/O In: 506.4 [I.V.:506.4] Out: -   Lab Results:  CBC: Recent Labs  10/27/16 2020 10/27/16 2022 10/28/16 0355  WBC 3.5*  --  4.1  HGB 8.3* 9.2* 8.9*  HCT 26.1* 27.0* 28.0*  PLT 103*  --  121*    BMET:  Recent Labs  10/25/16 1224  10/27/16 2022 10/28/16 0355  NA 141  < > 142 138  K 3.7  < > 4.0 4.4  CL 106  < > 110 111  CO2 23  --   --  23  GLUCOSE 95  < > 92 106*  BUN 9  < > 5* 6  CREATININE 0.98  < > 0.80 0.86  CALCIUM 9.3  --   --  8.3*  < > = values in this interval not displayed.   PT/INR:   Recent Labs  10/27/16 1415  LABPROT 17.4*  INR 1.42    CBG (last 3)   Recent Labs  10/27/16 1949 10/27/16 2339 10/28/16 0358    GLUCAP 76 111* 102*    ABG    Component Value Date/Time   PHART 7.340 (L) 10/27/2016 2156   PCO2ART 40.3 10/27/2016 2156   PO2ART 89.0 10/27/2016 2156   HCO3 21.7 10/27/2016 2156   TCO2 23 10/27/2016 2156   ACIDBASEDEF 4.0 (H) 10/27/2016 2156   O2SAT 96.0 10/27/2016 2156    CXR: PORTABLE CHEST 1 VIEW  COMPARISON:  10/27/2016  FINDINGS: Endotracheal and nasogastric tubes of been removed. Mitral valve prosthesis noted. Left subclavian Swan-Ganz catheter tip is in the right pulmonary artery. Right-sided chest tube noted, a second right-sided tube crosses the midline, as before, and could represent a mediastinal drain. Left IJ introducer sheath is noted.  Improved aeration in the right mid lung. Persistent low lung volumes. Mild enlargement of the cardiopericardial silhouette. No visible pneumothorax. Epicardial pacer leads noted. Degenerative glenohumeral arthropathy bilaterally.  IMPRESSION: 1. Improved aeration in the right mid lung. Removal of the endotracheal and nasogastric tubes. No visible pneumothorax.   Electronically Signed   By: Van Clines M.D.   On: 10/28/2016 07:33   Assessment/Plan: S/P Procedure(s) (LRB): MINIMALLY INVASIVE MITRAL VALVE REPAIR (MVR) USING 34  PHYSIO II ANNULOPLASTY RING (Right) TRANSESOPHAGEAL ECHOCARDIOGRAM (TEE) (N/A)  Doing well POD1 Maintaining NSR w/ stable hemodynamics, on NTG for hypertension Breathing comfortably w/ O2 sats 95% on room air Expected post op acute blood loss anemia, Hgb stable 8.9 Expected post op volume excess, mild Expected post op atelectasis, mild Post op thrombocytopenia, mild History of PE in past   Mobilize  Diuresis  D/C lines and foley  Leave chest tubes in   Start coumadin   [name], MD 10/28/2016 8:15 AM

## 2016-10-28 NOTE — Progress Notes (Signed)
Patient ID: Leonard Bentley, male   DOB: 10/29/1961, 55 y.o.   MRN: [id number] EVENING ROUNDS NOTE :     [address]       [place] [address]             [phone]                 1 [place] Post-Op Procedure(s) (LRB): MINIMALLY INVASIVE MITRAL VALVE REPAIR (MVR) USING 34 PHYSIO II ANNULOPLASTY RING (Right) TRANSESOPHAGEAL ECHOCARDIOGRAM (TEE) (N/A)  Total Length of Stay:  LOS: 1 Leonard Bentley  BP 115/84   Pulse 72   Temp 98 F (36.7 C)   Resp (!) 28   Ht 5\' 9"  (1.753 m)   Wt 200 lb 2.8 oz (90.8 kg)   SpO2 99%   BMI 29.56 kg/m   .Intake/Output      12/20 0701 - 12/21 0700 12/21 0701 - 12/22 0700   P.O.  1020   I.V. (mL/kg) 4593.8 (50.6) 306.4 (3.4)   Blood 846    IV Piggyback 2275 50   Total Intake(mL/kg) 7714.8 (85) 1376.4 (15.2)   Urine (mL/kg/hr) 4530 (2.1) 875 (0.8)   Blood 1200 (0.6)    Chest Tube 790 (0.4) 450 (0.4)   Total Output 6520 1325   Net +1194.8 +51.4          . sodium chloride Stopped (10/28/16 0600)  . lactated ringers    . nitroGLYCERIN Stopped (10/28/16 0928)     Lab Results  Component Value Date   WBC 6.5 10/28/2016   HGB 9.2 (L) 10/28/2016   HCT 28.7 (L) 10/28/2016   PLT 112 (L) 10/28/2016   GLUCOSE 104 (H) 10/28/2016   CHOL 239 (H) 04/03/2016   TRIG 63 04/03/2016   HDL 126 04/03/2016   LDLCALC 100 (H) 04/03/2016   ALT 58 10/25/2016   AST 64 (H) 10/25/2016   NA 138 10/28/2016   K 4.1 10/28/2016   CL 101 10/28/2016   CREATININE 1.01 10/28/2016   BUN 8 10/28/2016   CO2 23 10/28/2016   TSH 0.687 10/10/2015   PSA 1.29 03/08/2012   INR 1.42 10/27/2016   HGBA1C 5.2 10/25/2016   Stable Leonard Bentley , feels well  Grace Isaac MD  Beeper [phone] Office [phone] 10/28/2016 6:46 PM

## 2016-10-29 ENCOUNTER — Inpatient Hospital Stay (HOSPITAL_COMMUNITY): Payer: No Typology Code available for payment source

## 2016-10-29 LAB — BASIC METABOLIC PANEL
Anion gap: 4 — ABNORMAL LOW (ref 5–15)
BUN: 10 mg/dL (ref 6–20)
CO2: 27 mmol/L (ref 22–32)
Calcium: 8.3 mg/dL — ABNORMAL LOW (ref 8.9–10.3)
Chloride: 107 mmol/L (ref 101–111)
Creatinine, Ser: 0.98 mg/dL (ref 0.61–1.24)
GFR calc Af Amer: >60 mL/min (ref >60)
GFR calc non Af Amer: >60 mL/min (ref >60)
Glucose, Bld: 72 mg/dL (ref 65–99)
Potassium: 3.5 mmol/L (ref 3.5–5.1)
Sodium: 138 mmol/L (ref 135–145)

## 2016-10-29 LAB — GLUCOSE, CAPILLARY
Glucose-Capillary: 218 mg/dL — ABNORMAL HIGH (ref 65–99)
Glucose-Capillary: 85 mg/dL (ref 65–99)

## 2016-10-29 LAB — CBC
HCT: 27.3 % — ABNORMAL LOW (ref 39.0–52.0)
Hemoglobin: 8.8 g/dL — ABNORMAL LOW (ref 13.0–17.0)
MCH: 27 pg (ref 26.0–34.0)
MCHC: 32.2 g/dL (ref 30.0–36.0)
MCV: 83.7 fL (ref 78.0–100.0)
Platelets: 104 10*3/uL — ABNORMAL LOW (ref 150–400)
RBC: 3.26 MIL/uL — ABNORMAL LOW (ref 4.22–5.81)
RDW: 16 % — ABNORMAL HIGH (ref 11.5–15.5)
WBC: 4.2 10*3/uL (ref 4.0–10.5)

## 2016-10-29 LAB — PROTIME-INR
INR: 1.27
Prothrombin Time: 16 seconds — ABNORMAL HIGH (ref 11.4–15.2)

## 2016-10-29 MED ORDER — POTASSIUM CHLORIDE CRYS ER 20 MEQ PO TBCR
40.0000 meq | EXTENDED_RELEASE_TABLET | Freq: Once | ORAL | Status: AC
  Administered 2016-10-29: 40 meq via ORAL

## 2016-10-29 MED ORDER — SODIUM CHLORIDE 0.9 % IV SOLN
30.0000 meq | Freq: Once | INTRAVENOUS | Status: AC
  Administered 2016-10-29: 30 meq via INTRAVENOUS
  Filled 2016-10-29: qty 15

## 2016-10-29 MED ORDER — SODIUM CHLORIDE 0.9% FLUSH: 3.0000 mL | INTRAVENOUS | Status: DC | PRN

## 2016-10-29 MED ORDER — MOVING RIGHT ALONG BOOK
Freq: Once | Status: DC
  Filled 2016-10-29: qty 1

## 2016-10-29 MED ORDER — POTASSIUM CHLORIDE CRYS ER 20 MEQ PO TBCR
40.0000 meq | EXTENDED_RELEASE_TABLET | Freq: Once | ORAL | Status: AC
  Administered 2016-10-29: 40 meq via ORAL
  Filled 2016-10-29: qty 2

## 2016-10-29 MED ORDER — SODIUM CHLORIDE 0.9 % IV SOLN: 250.0000 mL | INTRAVENOUS | Status: DC | PRN

## 2016-10-29 MED ORDER — SODIUM CHLORIDE 0.9% FLUSH
3.0000 mL | Freq: Two times a day (BID) | INTRAVENOUS | Status: DC
  Administered 2016-10-29 – 2016-10-30 (×4): 3 mL via INTRAVENOUS

## 2016-10-29 MED ORDER — POLYSACCHARIDE IRON COMPLEX 150 MG PO CAPS
150.0000 mg | ORAL_CAPSULE | Freq: Every day | ORAL | Status: DC
  Administered 2016-10-29 – 2016-10-31 (×3): 150 mg via ORAL
  Filled 2016-10-29 (×3): qty 1

## 2016-10-29 MED ORDER — FA-PYRIDOXINE-CYANOCOBALAMIN 2.5-25-2 MG PO TABS
1.0000 | ORAL_TABLET | Freq: Every day | ORAL | Status: DC
Start: 2016-10-29 — End: 2016-10-31
  Administered 2016-10-29 – 2016-10-31 (×3): 1 via ORAL
  Filled 2016-10-29 (×3): qty 1

## 2016-10-29 MED ORDER — FUROSEMIDE 10 MG/ML IJ SOLN
20.0000 mg | Freq: Once | INTRAMUSCULAR | Status: AC
  Administered 2016-10-29: 20 mg via INTRAVENOUS
  Filled 2016-10-29: qty 2

## 2016-10-29 MED ORDER — FUROSEMIDE 40 MG PO TABS
40.0000 mg | ORAL_TABLET | Freq: Every day | ORAL | Status: DC
  Administered 2016-10-30 – 2016-10-31 (×2): 40 mg via ORAL
  Filled 2016-10-29 (×2): qty 1

## 2016-10-29 MED ORDER — POTASSIUM CHLORIDE CRYS ER 20 MEQ PO TBCR
20.0000 meq | EXTENDED_RELEASE_TABLET | Freq: Every day | ORAL | Status: DC
  Administered 2016-10-30 – 2016-10-31 (×2): 20 meq via ORAL
  Filled 2016-10-29 (×2): qty 1

## 2016-10-29 MED ORDER — WARFARIN SODIUM 5 MG PO TABS
5.0000 mg | ORAL_TABLET | Freq: Every day | ORAL | Status: DC
  Administered 2016-10-29 – 2016-10-30 (×2): 5 mg via ORAL
  Filled 2016-10-29 (×2): qty 1

## 2016-10-29 NOTE — Progress Notes (Signed)
      [address]       [place], [address]             [phone]        CARDIOTHORACIC SURGERY PROGRESS NOTE   R2 Days Post-Op Procedure(s) (LRB): MINIMALLY INVASIVE MITRAL VALVE REPAIR (MVR) USING 34 PHYSIO II ANNULOPLASTY RING (Right) TRANSESOPHAGEAL ECHOCARDIOGRAM (TEE) (N/A)  Subjective: Feels well.  Minimal pain.  Already walked 3 laps around SICU  Objective: Vital signs: BP Readings from Last 1 Encounters:  10/29/16 110/72   Pulse Readings from Last 1 Encounters:  10/29/16 72   Resp Readings from Last 1 Encounters:  10/29/16 19   Temp Readings from Last 1 Encounters:  10/29/16 98 F (36.7 C) (Oral)    Hemodynamics: PAP: (52)/(24) 52/24  Physical Exam:  Rhythm:   sinus  Breath sounds: clear  Heart sounds:  RRR  Incisions:  Dressings dry, intact  Abdomen:  Soft, non-distended, non-tender  Extremities:  Warm, well-perfused  Chest tubes:  Decreasing but significant volume thin serosanguinous output, no air leak    Intake/Output from previous [place]: 12/21 0701 - 12/22 0700 In: 1866.4 [P.O.:1260; I.V.:506.4; IV Piggyback:100] Out: 1925 [id number]; Chest Tube:550] Intake/Output this shift: No intake/output data recorded.  Lab Results:  CBC: Recent Labs  10/28/16 1559 10/29/16 0328  WBC 6.5 4.2  HGB 9.2* 8.8*  HCT 28.7* 27.3*  PLT 112* 104*    BMET:  Recent Labs  10/28/16 0355 10/28/16 1553 10/28/16 1559 10/29/16 0328  NA 138 138  --  138  K 4.4 4.1  --  3.5  CL 111 101  --  107  CO2 23  --   --  27  GLUCOSE 106* 104*  --  72  BUN 6 8  --  10  CREATININE 0.86 1.00 1.01 0.98  CALCIUM 8.3*  --   --  8.3*     PT/INR:   Recent Labs  10/29/16 0328  LABPROT 16.0*  INR 1.27    CBG (last 3)   Recent Labs  10/28/16 2318 10/29/16 0349 10/29/16 0454  GLUCAP 124* 218* 85    ABG    Component Value Date/Time   PHART 7.340 (L) 10/27/2016 2156   PCO2ART 40.3 10/27/2016 2156   PO2ART 89.0 10/27/2016 2156   HCO3  21.7 10/27/2016 2156   TCO2 25 10/28/2016 1553   ACIDBASEDEF 4.0 (H) 10/27/2016 2156   O2SAT 96.0 10/27/2016 2156    CXR: PORTABLE CHEST 1 VIEW  COMPARISON:  Chest radiograph from one [place] prior.  FINDINGS: Right chest tube terminates over the right lung apex. Mitral valve prosthesis is in place. Stable cardiomediastinal silhouette with mild cardiomegaly. No pneumothorax. No significant pleural effusions. No overt pulmonary edema. Stable mild bibasilar atelectasis. No new consolidative airspace disease.  IMPRESSION: 1. Right apical chest tube in place with no pneumothorax . 2. Stable mild bibasilar atelectasis. 3. Stable mild cardiomegaly without pulmonary edema.   Electronically Signed   By: Ilona Sorrel M.D.   On: 10/29/2016 07:19   Assessment/Plan: S/P Procedure(s) (LRB): MINIMALLY INVASIVE MITRAL VALVE REPAIR (MVR) USING 34 PHYSIO II ANNULOPLASTY RING (Right) TRANSESOPHAGEAL ECHOCARDIOGRAM (TEE) (N/A)  Doing well POD1 Maintaining NSR w/ stable BP Breathing comfortably w/ O2 sats 100% on RA   Mobilize  Diuresis  Coumadin  Leave chest tubes in at least 1 more [place]  Transfer step down  [name], MD 10/29/2016 8:29 AM

## 2016-10-29 NOTE — Discharge Summary (Signed)
Physician Discharge Summary  Patient ID: Leonard Bentley MRN: [id number] DOB/AGE: 07/15/1961 55 y.o.  Admit date: 10/27/2016 Discharge date: 10/31/2016  Admission Diagnoses: Patient Active Problem List   Diagnosis Date Noted    10/27/2016  . Acute pulmonary embolism ([place]) 04/11/2016  . Left sided numbness   . Anxiety disorder 04/02/2016  . Panic attacks 04/02/2016  . Mitral valve prolapse   . Mitral regurgitation 01/20/2016  . Atrial tachycardia ([place]) 01/06/2016  . Murmur 01/06/2016  . Mobitz I 10/09/2015  . PAC (premature atrial contraction) 10/09/2015  . Arthritis 08/14/2015  . Neuropathy due to chemotherapeutic drug ([place]) 03/09/2013  . Leucopenia 01/31/2013  . Personal history of pulmonary embolism 12/28/2012  . Cigarette smoker 08/24/2012  . Hemorrhoid 03/08/2012  . Anemia 12/10/2011  . History of non-Hodgkin's lymphoma 09/25/2011    Discharge Diagnoses:  Principal Problem:   S/P minimally-invasive mitral valve repair Active Problems:   Anemia   Personal history of pulmonary embolism   Mitral regurgitation   Mitral valve prolapse   Anxiety disorder   Discharged Condition: good  HPI:  Patient is a 55 year old male with history of mitral valve prolapse and severe mitral regurgitation, stage II large B cell non-Hodgkin's lymphoma diagnosed in 2012 treated with systemic chemotherapy, recurrent pulmonary embolism on long-term anticoagulation, and Mobitz type I second-degree AV block with frequent non-conducted PAC's associated with symptoms of palpitationswho returns to the office today for follow-up of severe mitral regurgitation. He was originally seen in consultation on 02/05/2016 after he had undergone transesophageal echocardiogram demonstrating the presence of mitral valve prolapse with severe mitral regurgitation. He underwent CT angiogram of the chest, abdomen, and pelvis as part of his routine preoperative workup for possible elective namely invasive mitral valve  repair. He was found to have a recurrent pulmonary embolus and was placed back on anticoagulation using Xarelto, and plans for elective mitral valve repair were postponed. He was seen in follow-up recently by Dr. Oval Bentley and reportedly doing quite well, although the patient does complain of mild exertional shortness of breath associated with more strenuous exertion. The patient exercises routinely and has noticed a significant change in his exercise tolerance. Palpitations have been much improved on beta blocker therapy. Follow-up CT angiogram of the chest revealed resolution of the pulmonary embolus with only minimal residual linear atelectasis and clot at the right lung base. The patient is now interested in proceeding with elective mitral valve repair and he recently underwent routine left and right heart catheterization. He was found to have no significant coronary artery disease and normal pulmonary artery pressures. The patient has also been seen recently by Dr. Inda Bentley follows him because of his history of non-Hodgkin's lymphoma. The patient was noted to have stable leukocytopenia, neutropenia, and anemia that were felt to be secondary to his treatment with diclofenac and previous chemotherapy. The patient returns to our office and reports that he feels well. He exercises routinely on a regular basis. He states that he gets short of breath with strenuous activity only. He denies any resting shortness of breath, PND, orthopnea, or lower extremity edema. He has not been having palpitations recently. He does report occasional cramps in his hands and legs that do not occur with activity but typically occur only at rest. The remainder of the patient's review of systems is unremarkable in the remainder of his past medical history is unchanged from previously.  Patient returns to the office today for follow-up of mitral valve prolapse with severe mitral regurgitation. He was originally  seen in consultation  on 02/05/2016 at which time he remained asymptomatic. More recently he was seen in follow-up on 08/23/2016. At that time he reported a significant decrease in his exercise tolerance with a tendency to get short of breath with more strenuous physical exertion. He previously had problems with palpitations, but that had improved on low-dose beta blocker therapy. After our last office visit the patient made a decision to proceed with elective mitral valve repair over the Christmas holiday. He returns to the office today with tentative plans to proceed with surgery next week. He states that over the last few weeks he has not been taking his beta blocker, and he has had increased palpitations. He denies any resting shortness of breath or chest discomfort. He denies any fevers, chills, or productive cough. The remainder of his review of systems is unchanged from previously.  The patient is single and lives locally by himself in [place]. He has 1 adult daughter who lives in [place]. He works full-time doing Dealer at [employer]. He has remained physically active all of his adult life. He enjoys running and typically runs at least 3 or 4 times every week. He reports no significant physical limitations up until the past year. He has mild arthritis in his back that does not limit his activity to any significant degree. 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 He states that he can still go running and does not typically get short of breath with running. However, if he walks up 3 flights of stairs he will be out of breath. He has never had any resting shortness of breath, PND, orthopnea, or lower extremity edema. He has not had any chest pain or chest tightness either with activity or at rest. He still has palpitations although the frequency of palpitations has decreased since he has been taking metoprolol. He has had some dizzy spells without  syncope.  Hospital Course:  On 10/27/2016 Leonard Bentley underwent a minimally invasive mitral valve repair with Dr. Roxy Manns. He tolerated the procedure well and was transferred to ICU. He was extubated in a timely manner. POD 1 he remained hemodynamically stable. We worked on mobilization. We continued to wean his oxygen to room air.  We discontinued his line and foley. We initiated coumadin for his mitral valve repair.  His chest tube output remained low and they were successfully removed on POD #3.  He is in NSR, but does have occasional PVCs.  Repeat EKG was obtained and is stable from his baseline EKGs.  He will be restarted on Toprol XL at 12.5 mg daily.  He remains on coumadin but has not shown much response.  His INR is 1.06 and he will be discharged home on 5 mg daily.  He will also be discharged home on Lovenox to bridge until INR is therapeutic.  He is ambulating independently.  His pain is well controlled.  He is medically stable for discharge home today.      Consults: respiratory therapy  Significant Diagnostic Studies:  CLINICAL DATA:  Atelectasis.  EXAM: PORTABLE CHEST 1 VIEW  COMPARISON:  Chest radiograph from one [place] prior.  FINDINGS: Right chest tube terminates over the right lung apex. Mitral valve prosthesis is in place. Stable cardiomediastinal silhouette with mild cardiomegaly. No pneumothorax. No significant pleural effusions. No overt pulmonary edema. Stable mild bibasilar atelectasis. No new consolidative airspace disease.  IMPRESSION: 1. Right apical chest tube in place with no pneumothorax .  2. Stable mild bibasilar atelectasis. 3. Stable mild cardiomegaly without pulmonary edema.   Electronically Signed   By: Ilona Sorrel M.D.   On: 10/29/2016 07:19  Treatments:  Date of Procedure:                10/27/2016  Preoperative Diagnosis:      Severe Mitral Regurgitation  Postoperative Diagnosis:    Same  Procedure:        Minimally-Invasive Mitral  Valve Repair             Complex valvuloplasty including artificial Gore-tex neochord placement x6             Edwards Physio II Ring Annuloplasty (size 32mm, model # [id number], serial # [id number])               Surgeon:        Valentina Gu. 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  Bileaflet billowing and prolapse with severe prolapse of P2 segment of posterior leaflet  Type II dysfunction with severe mitral regurgitation  LV chamber enlargement with normal LV systolic function  No residual mitral regurgitation after successful valve repair  Disposition: 01-Home or Self Care   Discharge Medications:  The patient has been discharged on:   1.Beta Blocker:  Yes [ x  ]                              No   [   ]                              If No, reason:  2.Ace Inhibitor/ARB: Yes [   ]                                     No  [ x   ]                                     If No, reason: labile BP  3.Statin:   Yes [   ]                  No  [x   ]                  If No, reason: NO CAD  4.[place]:  Yes  [ x  ]                  No   [   ]                  If No, reason:  Allergies as of 10/31/2016   No Known Allergies     Medication List    STOP taking these medications   enoxaparin 150 MG/ML injection Commonly known as:  LOVENOX Replaced by:  enoxaparin 40 MG/0.4ML injection   rivaroxaban 20 MG Tabs tablet Commonly known as:  XARELTO     TAKE these medications   acetaminophen 500 MG tablet Commonly known as:  TYLENOL Take 1,000 mg by mouth every 6 (six) hours as  needed (for arthritis pain.).   aspirin 81 MG EC tablet Take 1 tablet (81 mg total) by mouth daily. Start taking on:  11/01/2016   enoxaparin 40 MG/0.4ML injection Commonly known as:  LOVENOX Inject 0.4 mLs (40 mg total) into the skin daily. Replaces:  enoxaparin 150 MG/ML  injection   fluticasone 50 MCG/ACT nasal spray Commonly known as:  FLONASE Place 1 spray into both nostrils 2 (two) times daily as needed (for nasal symptoms.).   folic acid-pyridoxine-cyancobalamin 2.5-25-2 MG Tabs tablet Commonly known as:  FOLTX Take 1 tablet by mouth daily. Start taking on:  11/01/2016   furosemide 40 MG tablet Commonly known as:  LASIX Take 1 tablet (40 mg total) by mouth daily. For 3 days Start taking on:  11/01/2016   iron polysaccharides 150 MG capsule Commonly known as:  NIFEREX Take 1 capsule (150 mg total) by mouth daily. Start taking on:  11/01/2016   metoprolol succinate 25 MG 24 hr tablet Commonly known as:  TOPROL XL Take 0.5 tablets (12.5 mg total) by mouth daily. What changed:  how much to take  how to take this  when to take this  additional instructions   oxyCODONE 5 MG immediate release tablet Commonly known as:  Oxy IR/ROXICODONE Take 1-2 tablets (5-10 mg total) by mouth every 3 (three) hours as needed for severe pain.   potassium chloride SA 20 MEQ tablet Commonly known as:  K-DUR,KLOR-CON Take 1 tablet (20 mEq total) by mouth daily. For 3 Days Start taking on:  11/01/2016   warfarin 5 MG tablet Commonly known as:  COUMADIN Take 1 tablet (5 mg total) by mouth daily at 6 PM.        Allergies as of 10/31/2016   No Known Allergies           Discharge Instructions    Amb Referral to Cardiac Rehabilitation    Complete by:  As directed    Diagnosis:  Valve Repair   Valve:  Mitral     Allergies as of 10/31/2016   No Known Allergies       Follow-up Information    Almyra Deforest, PA Follow up.   Specialties:  Cardiology, Radiology Why:  Cardiology Hospital Follow-up on 11/27/2015 at 11:30AM. (Dr. Blenda Mounts Office). Contact information: [address] [place] [address] [phone]          The patient has been discharged on:   1.Beta Blocker:  Yes [ x  ]                               No   [   ]                              If No, reason:  2.Ace Inhibitor/ARB: Yes [   ]                                     No  [ x   ]                                     If No, reason: titration of BB  3.Statin:   Yes [   ]  No  [ x  ]                  If No, reason:  4.[place]:  Yes  [ x  ]                  No   [   ]                  If No, reason:   Signed: Chamika Cunanan 10/31/2016, 10:24 AM

## 2016-10-29 NOTE — Progress Notes (Signed)
Anesthesiology Follow-up:  Awake and alert in good spirits, feels better with activity, no palpitaions,  good pain relief with On-Q pump. Moved to 2W today.  VS: T-36.9 BP-90/68 HR- 71(SR) RR 18 O2 sat 98% on RA  Na- 138 K-3.5 BUN/Cr 10/0.98 glucose- 103 H/H-  8.8/27 Platelets- 104,000  Extubated 6 hours post-op.  55 year old male, 2 days S/P minimally invasive MV repair, doing well. No apparent complications.  Roberts Gaudy, MD

## 2016-10-30 LAB — CBC
HCT: 27.7 % — ABNORMAL LOW (ref 39.0–52.0)
Hemoglobin: 8.8 g/dL — ABNORMAL LOW (ref 13.0–17.0)
MCH: 26.6 pg (ref 26.0–34.0)
MCHC: 31.8 g/dL (ref 30.0–36.0)
MCV: 83.7 fL (ref 78.0–100.0)
Platelets: 112 10*3/uL — ABNORMAL LOW (ref 150–400)
RBC: 3.31 MIL/uL — ABNORMAL LOW (ref 4.22–5.81)
RDW: 15.8 % — ABNORMAL HIGH (ref 11.5–15.5)
WBC: 4.9 10*3/uL (ref 4.0–10.5)

## 2016-10-30 LAB — BASIC METABOLIC PANEL
Anion gap: 6 (ref 5–15)
BUN: 11 mg/dL (ref 6–20)
CO2: 26 mmol/L (ref 22–32)
Calcium: 8.6 mg/dL — ABNORMAL LOW (ref 8.9–10.3)
Chloride: 108 mmol/L (ref 101–111)
Creatinine, Ser: 0.99 mg/dL (ref 0.61–1.24)
GFR calc Af Amer: >60 mL/min (ref >60)
GFR calc non Af Amer: >60 mL/min (ref >60)
Glucose, Bld: 102 mg/dL — ABNORMAL HIGH (ref 65–99)
Potassium: 4.6 mmol/L (ref 3.5–5.1)
Sodium: 140 mmol/L (ref 135–145)

## 2016-10-30 LAB — PROTIME-INR
INR: 1.14
Prothrombin Time: 14.7 seconds (ref 11.4–15.2)

## 2016-10-30 MED ORDER — WARFARIN VIDEO: Freq: Once | Status: DC

## 2016-10-30 MED ORDER — METOPROLOL SUCCINATE ER 25 MG PO TB24: 12.5000 mg | ORAL_TABLET | Freq: Every day | ORAL | Status: DC

## 2016-10-30 MED ORDER — COUMADIN BOOK
Freq: Once | Status: AC
  Administered 2016-10-30: 20:00:00
  Filled 2016-10-30: qty 1

## 2016-10-30 MED ORDER — ASPIRIN EC 81 MG PO TBEC
81.0000 mg | DELAYED_RELEASE_TABLET | Freq: Every day | ORAL | Status: DC
  Administered 2016-10-30 – 2016-10-31 (×2): 81 mg via ORAL
  Filled 2016-10-30 (×2): qty 1

## 2016-10-30 NOTE — Progress Notes (Signed)
CARDIAC REHAB PHASE I   PRE:  Rate/Rhythm: 70 SR  BP:  Supine:   Sitting: 127/97  Standing:    SaO2: 100% RA  MODE:  Ambulation: 550 ft   POST:  Rate/Rhythm: 80 SR  BP:  Supine:   Sitting: 140/101  Standing:    SaO2: 100% RA  1040-1120 Pt tolerated ambulation well with assist x1. Gait steady, c/o mild burning sensation in upper right thigh during walk, informed patient's nurse Jeneen Rinks. Blood pressure elevated before and after ambulation. To bed after ambulation, CT intact, call bell within reach. Reviewed restrictions, IS use, risk factor modification and activity guidelines with patient, and pt verbalizes understanding of instructions given. Discussed Phase 2 cardiac rehab, and pt is interested in the program at [facility], will send referral.  Sol Passer, MS, ACSM CEP

## 2016-10-30 NOTE — Progress Notes (Signed)
dc'ed pacing wires pt. tolerated well 

## 2016-10-30 NOTE — Progress Notes (Signed)
dc'ed both chest tubes pt tolerated well

## 2016-10-30 NOTE — Progress Notes (Addendum)
      [address]       [place] [address]             [phone]      3 Days Post-Op Procedure(s) (LRB): MINIMALLY INVASIVE MITRAL VALVE REPAIR (MVR) USING 34 PHYSIO II ANNULOPLASTY RING (Right) TRANSESOPHAGEAL ECHOCARDIOGRAM (TEE) (N/A)   Subjective:  Complains of feeling "bloated"  Otherwise has no complaints.    Objective: Vital signs in last 24 hours: Temp:  [98 F (36.7 C)-98.7 F (37.1 C)] 98.7 F (37.1 C) (12/23 0522) Pulse Rate:  [71-74] 71 (12/23 0522) Cardiac Rhythm: Normal sinus rhythm;Heart block (12/22 1900) Resp:  [17-18] 18 (12/23 0522) BP: (90-148)/(68-114) 114/90 (12/23 0522) SpO2:  [92 %-100 %] 100 % (12/23 0522) Weight:  [196 lb 6.4 oz (89.1 kg)] 196 lb 6.4 oz (89.1 kg) (12/23 0522)  Intake/Output from previous [place]: 12/22 0701 - 12/23 0700 In: 890 [P.O.:840; IV Piggyback:50] Out: 820 [Urine:700; Chest Tube:120]  General appearance: alert, cooperative and no distress Heart: regular rate and rhythm  Lungs: clear to auscultation bilaterally Abdomen: soft, non-tender; bowel sounds normal; no masses,  no organomegaly Extremities: edema trace Wound: clean and dry  Lab Results:  Recent Labs  10/29/16 0328 10/30/16 0422  WBC 4.2 4.9  HGB 8.8* 8.8*  HCT 27.3* 27.7*  PLT 104* 112*   BMET:  Recent Labs  10/29/16 0328 10/30/16 0422  NA 138 140  K 3.5 4.6  CL 107 108  CO2 27 26  GLUCOSE 72 102*  BUN 10 11  CREATININE 0.98 0.99  CALCIUM 8.3* 8.6*    PT/INR:  Recent Labs  10/30/16 0422  LABPROT 14.7  INR 1.14   ABG    Component Value Date/Time   PHART 7.340 (L) 10/27/2016 2156   HCO3 21.7 10/27/2016 2156   TCO2 25 10/28/2016 1553   ACIDBASEDEF 4.0 (H) 10/27/2016 2156   O2SAT 96.0 10/27/2016 2156   CBG (last 3)   Recent Labs  10/28/16 2318 10/29/16 0349 10/29/16 0454  GLUCAP 124* 218* 85    Assessment/Plan: S/P Procedure(s) (LRB): MINIMALLY INVASIVE MITRAL VALVE REPAIR (MVR) USING 34 PHYSIO II  ANNULOPLASTY RING (Right) TRANSESOPHAGEAL ECHOCARDIOGRAM (TEE) (N/A)  1. CV- NSR with 1st degree AV block, occasional dropped complex- will d/c Lopressor, will discuss pacing wire removal with Dr. Roxy Manns 2. INR 1.14, repeat coumadin at 5 mg daily 3. Pulm- chest tube with 120cc output yesterday- will plan to remove today if ok with Dr. Roxy Manns, good use of IS, off oxygen 4. Renal- creatinine WNL, + hypervolemia will continue Lasix 5. Dispo- will discuss rhythm with Dr. Roxy Manns, continue Coumadin, likely remove chest tubes today   LOS: 3 days    BARRETT, ERIN 10/30/2016  I have seen and examined the patient and agree with the assessment and plan as outlined.  D/C chest tubes.  [name], MD 10/30/2016 9:36 AM

## 2016-10-31 ENCOUNTER — Inpatient Hospital Stay (HOSPITAL_COMMUNITY): Payer: No Typology Code available for payment source

## 2016-10-31 LAB — PROTIME-INR
INR: 1.06
Prothrombin Time: 13.9 seconds (ref 11.4–15.2)

## 2016-10-31 MED ORDER — OXYCODONE HCL 5 MG PO TABS: 5.0000 mg | ORAL_TABLET | ORAL | 0 refills | Status: DC | PRN

## 2016-10-31 MED ORDER — METOPROLOL SUCCINATE ER 25 MG PO TB24: 12.5000 mg | ORAL_TABLET | Freq: Every day | ORAL | Status: DC

## 2016-10-31 MED ORDER — METOPROLOL SUCCINATE ER 25 MG PO TB24: 12.5000 mg | ORAL_TABLET | Freq: Every day | ORAL | 3 refills | Status: DC

## 2016-10-31 MED ORDER — WARFARIN SODIUM 7.5 MG PO TABS: 7.5000 mg | ORAL_TABLET | Freq: Every day | ORAL | Status: DC

## 2016-10-31 MED ORDER — POTASSIUM CHLORIDE CRYS ER 20 MEQ PO TBCR: 20.0000 meq | EXTENDED_RELEASE_TABLET | Freq: Every day | ORAL | 0 refills | Status: DC

## 2016-10-31 MED ORDER — ASPIRIN 81 MG PO TBEC: 81.0000 mg | DELAYED_RELEASE_TABLET | Freq: Every day | ORAL | Status: DC

## 2016-10-31 MED ORDER — FUROSEMIDE 40 MG PO TABS: 40.0000 mg | ORAL_TABLET | Freq: Every day | ORAL | 0 refills | Status: DC

## 2016-10-31 MED ORDER — ENOXAPARIN SODIUM 40 MG/0.4ML ~~LOC~~ SOLN: 40.0000 mg | SUBCUTANEOUS | 1 refills | Status: DC

## 2016-10-31 MED ORDER — POLYSACCHARIDE IRON COMPLEX 150 MG PO CAPS: 150.0000 mg | ORAL_CAPSULE | Freq: Every day | ORAL | 0 refills | Status: DC

## 2016-10-31 MED ORDER — FA-PYRIDOXINE-CYANOCOBALAMIN 2.5-25-2 MG PO TABS: 1.0000 | ORAL_TABLET | Freq: Every day | ORAL | 0 refills | Status: DC

## 2016-10-31 MED ORDER — ENOXAPARIN SODIUM 40 MG/0.4ML ~~LOC~~ SOLN
40.0000 mg | SUBCUTANEOUS | Status: DC
  Administered 2016-10-31: 40 mg via SUBCUTANEOUS
  Filled 2016-10-31: qty 0.4
  Filled 2016-10-31: qty 0.27

## 2016-10-31 MED ORDER — WARFARIN SODIUM 5 MG PO TABS: 5.0000 mg | ORAL_TABLET | Freq: Every day | ORAL | 1 refills | Status: DC

## 2016-10-31 NOTE — Progress Notes (Addendum)
      [address]       [place] [address]             [phone]      4 Days Post-Op Procedure(s) (LRB): MINIMALLY INVASIVE MITRAL VALVE REPAIR (MVR) USING 34 PHYSIO II ANNULOPLASTY RING (Right) TRANSESOPHAGEAL ECHOCARDIOGRAM (TEE) (N/A)   Subjective:  Complains of burning pain in right leg with ambulation in upper portion of thigh.  He also states his right breast is tender and swollen  Objective: Vital signs in last 24 hours: Temp:  [98.1 F (36.7 C)-98.7 F (37.1 C)] 98.1 F (36.7 C) (12/24 0500) Pulse Rate:  [69-80] 70 (12/24 0500) Cardiac Rhythm: Normal sinus rhythm (12/23 1900) Resp:  [18-20] 18 (12/24 0500) BP: (108-132)/(55-90) 117/84 (12/24 0500) SpO2:  [100 %] 100 % (12/24 0500) Weight:  [191 lb 11.2 oz (87 kg)] 191 lb 11.2 oz (87 kg) (12/24 0500)  Intake/Output from previous [place]: 12/23 0701 - 12/24 0700 In: 360 [P.O.:360] Out: -   General appearance: alert, cooperative and no distress Heart: regular rate and rhythm Lungs: clear to auscultation bilaterally Abdomen: soft, non-tender; bowel sounds normal; no masses,  no organomegaly Extremities: edema trace Wound: clean and dry, edema of right breast present  Lab Results:  Recent Labs  10/29/16 0328 10/30/16 0422  WBC 4.2 4.9  HGB 8.8* 8.8*  HCT 27.3* 27.7*  PLT 104* 112*   BMET:  Recent Labs  10/29/16 0328 10/30/16 0422  NA 138 140  K 3.5 4.6  CL 107 108  CO2 27 26  GLUCOSE 72 102*  BUN 10 11  CREATININE 0.98 0.99  CALCIUM 8.3* 8.6*    PT/INR:  Recent Labs  10/31/16 0306  LABPROT 13.9  INR 1.06   ABG    Component Value Date/Time   PHART 7.340 (L) 10/27/2016 2156   HCO3 21.7 10/27/2016 2156   TCO2 25 10/28/2016 1553   ACIDBASEDEF 4.0 (H) 10/27/2016 2156   O2SAT 96.0 10/27/2016 2156   CBG (last 3)   Recent Labs  10/28/16 2318 10/29/16 0349 10/29/16 0454  GLUCAP 124* 218* 85    Assessment/Plan: S/P Procedure(s) (LRB): MINIMALLY INVASIVE MITRAL  VALVE REPAIR (MVR) USING 34 PHYSIO II ANNULOPLASTY RING (Right) TRANSESOPHAGEAL ECHOCARDIOGRAM (TEE) (N/A)  1. CV- NSR, with occasional pause-no BB for now, 12 lead EKG ordered 2. INR 1.06, will increase coumadin to 7.5 mg daily...  3. Pulm- no acute issues, chest tubes removed yesterday, will get CXR for baseline  4. Renal- weight improving, continue diuretics 5. Dispo- patient stable, continues to drop complexes, awaiting 12 lead EKG, increase coumadin to 7.5 mg daily, patient needs to be closer to therapeutic with history of PE, continue current care   LOS: 4 days    BARRETT, ERIN 10/31/2016  I have seen and examined the patient and agree with the assessment and plan as outlined.  D/C home today if CXR okay - still pending.  Will continue daily lovenox injections and ASA until INR > 2.0   [name], MD 10/31/2016 10:33 AM

## 2016-10-31 NOTE — Discharge Instructions (Signed)
1. Please take 7.5 mg of Coumadin tonight 12/24, then resume 5 mg daily.   2. Must get PT/INR checked on Wednesday 12/27, please contact coumadin clinic first thing Tuesday Morning to schedule appointment  Mitral Valve Repair, Care After   This sheet gives you information about how to care for yourself after your procedure. Your health care provider may also give you more specific instructions. If you have problems or questions, contact your health care provider. What can I expect after the procedure? After the procedure, it is common to have:  Pain at the incision area that may last for several weeks. Follow these instructions at home: Incision care  Follow instructions from your health care provider about how to take care of your incision. Make sure you:  Wash your hands with soap and water before you change your bandage (dressing). If soap and water are not available, use hand sanitizer.  Change your dressing as told by your health care provider.  Leave stitches (sutures), skin glue, or adhesive strips in place. These skin closures may need to stay in place for 2 weeks or longer. If adhesive strip edges start to loosen and curl up, you may trim the loose edges. Do not remove adhesive strips completely unless your health care provider tells you to do that.  Check your incision area every Bouler for signs of infection. Check for:  More redness, swelling, or pain.  More fluid or blood.  Warmth.  Pus or a bad smell.  Do not apply powder or lotion to the area. Driving  Do not drive until your health care provider approves.  Do not drive or use heavy machinery while taking prescription pain medicines. Bathing  Do not take baths, swim, or use a hot tub for 2-4 weeks after surgery, or until your health care provider approves. Ask your health care provider if you may take showers.  To wash the incision site, gently wash with soap and water and pat the area dry with a clean towel. Do  not rub the incision area. That may cause bleeding. Activity  Rest as told by your health care provider. Ask your health care provider when you can resume normal activities, including sexual activity.  Avoid the following activities for 6-8 weeks, or as long as directed:  Lifting anything that is heavier than 10 lb (4.5 kg), or the limit that your health care provider tells you.  Pushing or pulling things with your arms.  Avoid climbing stairs and using the handrail to pull yourself up for the first 2-3 weeks after surgery.  Avoid airplane travel for 4-6 weeks, or as long as directed.  Avoid sitting for long periods of time and crossing your legs. Get up and move around at least once every 1-2 hours.  If you are taking blood thinners (anticoagulants), avoid activities that have a high risk of injury. Ask your health care provider what activities are safe for you. Lifestyle  Limit alcohol intake to no more than 1 drink a Vilardi for nonpregnant women and 2 drinks a Masek for men. One drink equals 12 oz of beer, 5 oz of wine, or 1 oz of hard liquor.  Do not use any products that contain nicotine or tobacco, such as cigarettes and e-cigarettes. If you need help quitting, ask your health care provider. General instructions  Take your temperature every Orman and weigh yourself every morning for the first 7 days after surgery. Write your temperatures and weight down and take this record  with you to any follow-up visits.  Take over-the-counter and prescription medicines only as told by your health care provider.  To prevent or treat constipation while you are taking prescription pain medicine, your health care provider may recommend that you:  Drink enough fluid to keep your urine clear or pale yellow.  Take over-the-counter or prescription medicines.  Eat foods that are high in fiber, such as fresh fruits and vegetables, whole grains, and beans.  Limit foods that are high in fat and processed  sugars, such as fried and sweet foods.  Follow instructions from your health care provider about eating or drinking restrictions.  Wear compression stockings for at least 2 weeks, or as long as told by your health care provider. These stockings help to prevent blood clots and reduce swelling in your legs. If your ankles are swollen after 2 weeks, continue to wear the stockings.  Keep all follow-up visits as told by your health care provider. This is important. Contact a health care provider if:  You develop a skin rash.  Your weight is increasing each Hagner over 2-3 days.  You gain 2 lb (1 kg) or more in a single Rac[place].  You have a fever. Get help right away if:  You develop chest pain that feels different from the pain caused by your incision.  You develop shortness of breath or difficulty breathing.  You have more redness, swelling, or pain around your incision.  You have more fluid or blood coming from your incision.  Your incision feels warm to the touch.  You have pus or a bad smell coming from your incision.  You feel light-headed. This information is not intended to replace advice given to you by your health care provider. Make sure you discuss any questions you have with your health care provider. Document Released: 05/14/2005 Document Revised: 08/06/2016 Document Reviewed: 08/06/2016 Elsevier Interactive Patient Education  2017 [employer] and Warfarin Warfarin is a blood thinner (anticoagulant). Anticoagulant medicines help prevent the formation of blood clots. These medicines work by decreasing the activity of vitamin K, which promotes normal blood clotting. When you take warfarin, problems can occur from suddenly increasing or decreasing the amount of vitamin K that you eat from one Leavelle to the next. Problems may include:  Blood clots.  Bleeding. What general guidelines do I need to follow? To avoid problems when taking warfarin:  Eat a  balanced diet that includes:  Fresh fruits and vegetables.  Whole grains.  Low-fat dairy products.  Lean proteins, such as fish, eggs, and lean cuts of meat.  Keep your intake of vitamin K consistent from [place] to [place]. To do this:  Avoid eating large amounts of vitamin K one F[place]an and low amounts of vitamin K the next [place].  If you take a multivitamin that contains vitamin K, be sure to take it every [place].  Know which foods contain vitamin K. Use the lists below to understand serving sizes and the amount of vitamin K in one serving.  Avoid major changes in your diet. If you are going to change your diet, talk with your health care provider before making changes.  Work with a Financial planner (dietitian) to develop a meal plan that works best for you. High vitamin K foods Foods that are high in vitamin K contain more than 100 mcg (micrograms) per serving. These include:  Broccoli (cooked) -  cup has 110 mcg.  Brussels sprouts (cooked) -  cup has  109 mcg.  Greens, beet (cooked) -  cup has 350 mcg.  Greens, collard (cooked) -  cup has 418 mcg.  Greens, turnip (cooked) -  cup has 265 mcg.  Green onions or scallions -  cup has 105 mcg.  Kale (fresh or frozen) -  cup has 531 mcg.  Parsley (raw) - 10 sprigs has 164 mcg.  Spinach (cooked) -  cup has 444 mcg.  Swiss chard (cooked) -  cup has 287 mcg. Moderate vitamin K foods Foods that have a moderate amount of vitamin K contain 25-100 mcg per serving. These include:  Asparagus (cooked) - 5 spears have 38 mcg.  Black-eyed peas (dried) -  cup has 32 mcg.  Cabbage (cooked) -  cup has 37 mcg.  Kiwi fruit - 1 medium has 31 mcg.  Lettuce - 1 cup has 57-63 mcg.  Okra (frozen) -  cup has 44 mcg.  Prunes (dried) - 5 prunes have 25 mcg.  Watercress (raw) - 1 cup has 85 mcg. Low vitamin K foods Foods low in vitamin K contain less than 25 mcg per serving. These include:  Artichoke - 1 medium has 18  mcg.  Avocado - 1 oz. has 6 mcg.  Blueberries -  cup has 14 mcg.  Cabbage (raw) -  cup has 21 mcg.  Carrots (cooked) -  cup has 11 mcg.  Cauliflower (raw) -  cup has 11 mcg.  Cucumber with peel (raw) -  cup has 9 mcg.  Grapes -  cup has 12 mcg.  Mango - 1 medium has 9 mcg.  Nuts - 1 oz. has 15 mcg.  Pear - 1 medium has 8 mcg.  Peas (cooked) -  cup has 19 mcg.  Pickles - 1 spear has 14 mcg.  Pumpkin seeds - 1 oz. has 13 mcg.  Sauerkraut (canned) -  cup has 16 mcg.  Soybeans (cooked) -  cup has 16 mcg.  Tomato (raw) - 1 medium has 10 mcg.  Tomato sauce -  cup has 17 mcg. Vitamin K-free foods If a food contain less than 5 mcg per serving, it is considered to have no vitamin K. These foods include:  Bread and cereal products.  Cheese.  Eggs.  Fish and shellfish.  Meat and poultry.  Milk and dairy products.  Sunflower seeds. Actual amounts of vitamin K in foods may be different depending on processing. Talk with your dietitian about what foods you can eat and what foods you should avoid. This information is not intended to replace advice given to you by your health care provider. Make sure you discuss any questions you have with your health care provider. Document Released: 08/22/2009 Document Revised: 05/16/2016 Document Reviewed: 01/28/2016 Elsevier Interactive Patient Education  2017 [employer].

## 2016-10-31 NOTE — Progress Notes (Signed)
Pt declines to watch the coumadin video tonight and prefers to watch it in the morning.

## 2016-11-02 ENCOUNTER — Telehealth: Payer: Self-pay | Admitting: Pharmacist

## 2016-11-02 LAB — TYPE AND SCREEN
Blood Product Expiration Date: 201801032359
Blood Product Expiration Date: 201801032359
Blood Product Expiration Date: 201801032359
Blood Product Expiration Date: 201801032359
ISSUE DATE / TIME: 201712200933
ISSUE DATE / TIME: 201712200933
Unit Type and Rh: 6200
Unit Type and Rh: 6200
Unit Type and Rh: 6200
Unit Type and Rh: 6200

## 2016-11-02 NOTE — Telephone Encounter (Signed)
Pt called unsure what to do about his coumadin. He states he was started on lovenox and coumadin and has been taking both since discharge on 12.24.17. He started on warfarin on 12.21.17 inpatient.  Advised he continue both until INR check in office. He is unable to come to our [address] location for a check today. He will come for INR check tomorrow at our [facility] location. Appt made. Pt states understanding and appreciation.

## 2016-11-02 NOTE — Anesthesia Postprocedure Evaluation (Signed)
Anesthesia Post Note  Patient: Leonard Bentley  Procedure(s) Performed: Procedure(s) (LRB): MINIMALLY INVASIVE MITRAL VALVE REPAIR (MVR) USING 34 PHYSIO II ANNULOPLASTY RING (Right) TRANSESOPHAGEAL ECHOCARDIOGRAM (TEE) (N/A)  Patient location during evaluation: SICU Anesthesia Type: General Level of consciousness: awake, awake and alert and oriented Pain management: pain level controlled Vital Signs Assessment: post-procedure vital signs reviewed and stable Respiratory status: spontaneous breathing, nonlabored ventilation and respiratory function stable Cardiovascular status: blood pressure returned to baseline Anesthetic complications: no       Last Vitals:  Vitals:   10/30/16 1951 10/31/16 0500  BP: 118/81 117/84  Pulse: 69 70  Resp: 18 18  Temp: 36.8 C 36.7 C    Last Pain:  Vitals:   10/31/16 0500  TempSrc: Oral  PainSc:                  Leonard Bentley

## 2016-11-03 ENCOUNTER — Ambulatory Visit (INDEPENDENT_AMBULATORY_CARE_PROVIDER_SITE_OTHER): Payer: No Typology Code available for payment source | Admitting: Pharmacist

## 2016-11-03 DIAGNOSIS — Z9889 Other specified postprocedural states: Secondary | ICD-10-CM | POA: Diagnosis not present

## 2016-11-03 DIAGNOSIS — Z86711 Personal history of pulmonary embolism: Secondary | ICD-10-CM | POA: Diagnosis not present

## 2016-11-03 LAB — POCT INR: INR: 1.4

## 2016-11-03 MED FILL — Lidocaine HCl IV Inj 20 MG/ML: INTRAVENOUS | Qty: 5 | Status: AC

## 2016-11-03 MED FILL — Heparin Sodium (Porcine) Inj 1000 Unit/ML: INTRAMUSCULAR | Qty: 30 | Status: AC

## 2016-11-03 MED FILL — Heparin Sodium (Porcine) Inj 1000 Unit/ML: INTRAMUSCULAR | Qty: 2500 | Status: AC

## 2016-11-03 MED FILL — Mannitol IV Soln 20%: INTRAVENOUS | Qty: 500 | Status: AC

## 2016-11-03 MED FILL — Sodium Chloride IV Soln 0.9%: INTRAVENOUS | Qty: 2000 | Status: AC

## 2016-11-03 MED FILL — Electrolyte-R (PH 7.4) Solution: INTRAVENOUS | Qty: 3000 | Status: AC

## 2016-11-03 MED FILL — Heparin Sodium (Porcine) Inj 1000 Unit/ML: INTRAMUSCULAR | Qty: 10 | Status: AC

## 2016-11-09 ENCOUNTER — Ambulatory Visit (INDEPENDENT_AMBULATORY_CARE_PROVIDER_SITE_OTHER)
Payer: No Typology Code available for payment source | Admitting: Pharmacist Clinician (PhC)/ Clinical Pharmacy Specialist

## 2016-11-09 DIAGNOSIS — Z9889 Other specified postprocedural states: Secondary | ICD-10-CM

## 2016-11-09 DIAGNOSIS — Z86711 Personal history of pulmonary embolism: Secondary | ICD-10-CM

## 2016-11-09 LAB — POCT INR: INR: 2.5

## 2016-11-10 ENCOUNTER — Other Ambulatory Visit: Payer: Self-pay | Admitting: *Deleted

## 2016-11-10 DIAGNOSIS — G8918 Other acute postprocedural pain: Secondary | ICD-10-CM

## 2016-11-10 MED ORDER — OXYCODONE HCL 5 MG PO TABS: 5.0000 mg | ORAL_TABLET | ORAL | 0 refills | Status: DC | PRN

## 2016-11-12 ENCOUNTER — Other Ambulatory Visit: Payer: Self-pay | Admitting: *Deleted

## 2016-11-12 DIAGNOSIS — R0989 Other specified symptoms and signs involving the circulatory and respiratory systems: Secondary | ICD-10-CM

## 2016-11-12 MED ORDER — GUAIFENESIN ER 600 MG PO TB12: 600.0000 mg | ORAL_TABLET | Freq: Two times a day (BID) | ORAL | 0 refills | Status: DC

## 2016-11-19 ENCOUNTER — Emergency Department (HOSPITAL_COMMUNITY)
Admission: EM | Admit: 2016-11-19 | Discharge: 2016-11-19 | Disposition: A | Discharge status: Home / Self Care | Payer: No Typology Code available for payment source | Attending: Emergency Medicine | Admitting: Emergency Medicine

## 2016-11-19 ENCOUNTER — Other Ambulatory Visit: Payer: Self-pay | Admitting: Cardiology

## 2016-11-19 ENCOUNTER — Emergency Department (HOSPITAL_COMMUNITY): Payer: No Typology Code available for payment source

## 2016-11-19 ENCOUNTER — Other Ambulatory Visit: Payer: Self-pay | Admitting: Thoracic Surgery (Cardiothoracic Vascular Surgery)

## 2016-11-19 ENCOUNTER — Encounter (HOSPITAL_COMMUNITY): Payer: Self-pay

## 2016-11-19 DIAGNOSIS — Z7901 Long term (current) use of anticoagulants: Secondary | ICD-10-CM | POA: Insufficient documentation

## 2016-11-19 DIAGNOSIS — Z87891 Personal history of nicotine dependence: Secondary | ICD-10-CM | POA: Diagnosis not present

## 2016-11-19 DIAGNOSIS — Z7982 Long term (current) use of aspirin: Secondary | ICD-10-CM | POA: Insufficient documentation

## 2016-11-19 DIAGNOSIS — I471 Supraventricular tachycardia: Secondary | ICD-10-CM | POA: Diagnosis not present

## 2016-11-19 DIAGNOSIS — Z8571 Personal history of Hodgkin lymphoma: Secondary | ICD-10-CM | POA: Diagnosis not present

## 2016-11-19 DIAGNOSIS — Z79899 Other long term (current) drug therapy: Secondary | ICD-10-CM | POA: Insufficient documentation

## 2016-11-19 DIAGNOSIS — R0602 Shortness of breath: Secondary | ICD-10-CM | POA: Insufficient documentation

## 2016-11-19 DIAGNOSIS — Z952 Presence of prosthetic heart valve: Secondary | ICD-10-CM

## 2016-11-19 LAB — BASIC METABOLIC PANEL
Anion gap: 14 (ref 5–15)
BUN: 7 mg/dL (ref 6–20)
CO2: 26 mmol/L (ref 22–32)
Calcium: 9.4 mg/dL (ref 8.9–10.3)
Chloride: 96 mmol/L — ABNORMAL LOW (ref 101–111)
Creatinine, Ser: 0.89 mg/dL (ref 0.61–1.24)
GFR calc Af Amer: >60 mL/min (ref >60)
GFR calc non Af Amer: >60 mL/min (ref >60)
Glucose, Bld: 111 mg/dL — ABNORMAL HIGH (ref 65–99)
Potassium: 5.8 mmol/L — ABNORMAL HIGH (ref 3.5–5.1)
Sodium: 136 mmol/L (ref 135–145)

## 2016-11-19 LAB — CBC
HCT: 37.8 % — ABNORMAL LOW (ref 39.0–52.0)
Hemoglobin: 12 g/dL — ABNORMAL LOW (ref 13.0–17.0)
MCH: 25.1 pg — ABNORMAL LOW (ref 26.0–34.0)
MCHC: 31.7 g/dL (ref 30.0–36.0)
MCV: 78.9 fL (ref 78.0–100.0)
Platelets: 360 10*3/uL (ref 150–400)
RBC: 4.79 MIL/uL (ref 4.22–5.81)
RDW: 14.6 % (ref 11.5–15.5)
WBC: 4.3 10*3/uL (ref 4.0–10.5)

## 2016-11-19 LAB — BRAIN NATRIURETIC PEPTIDE: B Natriuretic Peptide: 71.3 pg/mL (ref 0.0–100.0)

## 2016-11-19 LAB — MAGNESIUM: Magnesium: 1.6 mg/dL — ABNORMAL LOW (ref 1.7–2.4)

## 2016-11-19 LAB — PROTIME-INR
INR: 3.86
Prothrombin Time: 38.9 seconds — ABNORMAL HIGH (ref 11.4–15.2)

## 2016-11-19 LAB — I-STAT TROPONIN, ED: Troponin i, poc: 0 ng/mL (ref 0.00–0.08)

## 2016-11-19 MED ORDER — METOPROLOL SUCCINATE ER 25 MG PO TB24: 25.0000 mg | ORAL_TABLET | Freq: Every day | ORAL | 3 refills | Status: DC

## 2016-11-19 MED ORDER — DILTIAZEM HCL 25 MG/5ML IV SOLN
10.0000 mg | Freq: Once | INTRAVENOUS | Status: DC
  Filled 2016-11-19: qty 5

## 2016-11-19 MED ORDER — DEXTROMETHORPHAN HBR 15 MG/5ML PO SYRP: 10.0000 mL | ORAL_SOLUTION | Freq: Four times a day (QID) | ORAL | 0 refills | Status: DC | PRN

## 2016-11-19 MED ORDER — MAGNESIUM SULFATE 2 GM/50ML IV SOLN
2.0000 g | Freq: Once | INTRAVENOUS | Status: AC
  Administered 2016-11-19: 2 g via INTRAVENOUS
  Filled 2016-11-19: qty 50

## 2016-11-19 MED ORDER — SODIUM CHLORIDE 0.9 % IV BOLUS (SEPSIS)
1000.0000 mL | Freq: Once | INTRAVENOUS | Status: AC
  Administered 2016-11-19: 1000 mL via INTRAVENOUS

## 2016-11-19 MED ORDER — LORAZEPAM 2 MG/ML IJ SOLN
1.0000 mg | Freq: Once | INTRAMUSCULAR | Status: AC
  Administered 2016-11-19: 1 mg via INTRAVENOUS
  Filled 2016-11-19: qty 1

## 2016-11-19 MED ORDER — METOPROLOL TARTRATE 25 MG PO TABS
12.5000 mg | ORAL_TABLET | Freq: Once | ORAL | Status: AC
  Administered 2016-11-19: 12.5 mg via ORAL
  Filled 2016-11-19: qty 1

## 2016-11-19 NOTE — Consult Note (Signed)
Consult note    Patient ID: Leonard Bentley MRN: [id number], DOB/AGE: 57/17/1962   Admit date: 11/19/2016   Primary Physician: Wyatt Haste, MD Primary Cardiologist: Dr. Oval Linsey  Patient Profile    56 yo male with PMH of tobacco abuse, prior PE, and non-Hodgkin's lymphoma s/p chemotherapy and MVR (10/27/16) who presented to the ED with reports of sudden onset of tachycardia and dyspnea at 4am this morning.   Past Medical History    Past Medical History:  Diagnosis Date  . Acute pulmonary embolism ([place]) 04/11/2016  . Anxiety   . Arthritis   . Atrial tachycardia ([place]) 01/06/2016  . Cancer ([place]) 07/2011   STAGE II NON- HODGKIN LYMPHOMA  . Depression   . Dyspnea   . ED (erectile dysfunction)   . Lymphoma, non Hodgkin's 09/25/2011  . Mitral regurgitation 01/20/2016  . Mobitz I 10/09/2015  . Murmur 01/06/2016  . Occasional tremors   . PAC (premature atrial contraction) 10/09/2015  . Pulmonary emboli ([place])   . S/P minimally-invasive mitral valve repair 10/27/2016   Complex valvuloplasty including artificial Gore-tex neochord placement x6 and 34 mm Edwards Physio II ring annuloplasty via right mini thoracotomy approach    Past Surgical History:  Procedure Laterality Date  . CARDIAC CATHETERIZATION N/A 08/13/2016   Procedure: Right/Left Heart Cath and Coronary Angiography;  Surgeon: [name], MD;  Location: [place] [facility];  Service: Cardiovascular;  Laterality: N/A;  . MITRAL VALVE REPAIR Right 10/27/2016   Procedure: MINIMALLY INVASIVE MITRAL VALVE REPAIR (MVR) USING 34 PHYSIO II ANNULOPLASTY RING;  Surgeon: [name], MD;  Location: [place];  Service: Open Heart Surgery;  Laterality: Right;  . NO PAST SURGERIES    . TEE WITHOUT CARDIOVERSION N/A 02/11/2016   Procedure: TRANSESOPHAGEAL ECHOCARDIOGRAM (TEE);  Surgeon: [name], MD;  Location: [place];  Service: Cardiovascular;  Laterality: N/A;  . TEE WITHOUT CARDIOVERSION N/A 10/27/2016   Procedure: TRANSESOPHAGEAL ECHOCARDIOGRAM (TEE);  Surgeon: [name], MD;  Location: [place];  Service: Open Heart Surgery;  Laterality: N/A;  . TRIGGER FINGER RELEASE Bilateral      Allergies  No Known Allergies  History of Present Illness    [name] is a 56 yo male with PMH of  tobacco abuse, prior PE, and non-Hodgkin's lymphoma s/p chemotherapy and MVR (10/27/16). He was initially referred to cardiology after found to have Morbitz I and reported activity fatigue. He was referred for exercise Cardiolite that revealed LVEF 52% and no ischemia. On 01/20/16 he had an echo that was revealed LVEF 60-65% with moderate LVH and posterior mitral valve leaflet prolapse. There was moderate to severe mitral regurgitation.  He was referred for transesophageal echocardiography which confirmed moderate to severe mitral regurgitation. The left atrium was massively dilated but there was no left ventricle dilation.  Pulmonary pressures were normal.   He was referred to Dr. [name] for consideration of mitral valve replacement/repair.  He had a chest CT on 04/09/16 for preoperative assessment and was noted to have a pulmonary embolism in the right lower lobe.  Mr. Teixeira had a PE several years ago in the setting of testosterone therapy.  He was started on Xarelto anticoagulation indefinitely.  He had a follow up CT 07/20/16 that showed no acute thrombus and resolution of most of the prior PE.     Had [facility] 10/17 showing no CAD, EF of 55-65% with normal LVEDP.  He saw Dr. Roxy Manns and underwent successful MVR on 10/27/16, at this time he  was placed back on coumadin. Reports he has been doing well since surgery. Energy level has improved, breathing has been good.   Reports this morning at 4am he was getting up and developed a sudden onset of palpitations with dyspnea. Called EMS. In the ED his labs showed K 5.8, Cr 0.89, BNP 71.3, trop 0, Hgb 12.0, INR 3.86, and CXR was negative. He was given ativan and mag sulfate in the ED.     Home Medications    Prior to Admission medications   Medication Sig Start Date End Date Taking? Authorizing Provider  fluticasone (FLONASE) 50 MCG/ACT nasal spray Place 1 spray into both nostrils 2 (two) times daily as needed (for nasal symptoms.).   Yes Historical Provider, MD  metoprolol succinate (TOPROL XL) 25 MG 24 hr tablet Take 0.5 tablets (12.5 mg total) by mouth daily. 10/31/16  Yes Erin R Barrett, PA-C  oxyCODONE (OXY IR/ROXICODONE) 5 MG immediate release tablet Take 1-2 tablets (5-10 mg total) by mouth every 4 (four) hours as needed for severe pain. 11/10/16  Yes Ivin Poot, MD  warfarin (COUMADIN) 5 MG tablet Take 1 tablet (5 mg total) by mouth daily at 6 PM. 10/31/16  Yes Erin R Barrett, PA-C  aspirin EC 81 MG EC tablet Take 1 tablet (81 mg total) by mouth daily. Patient not taking: Reported on 11/19/2016 11/01/16   Erin R Barrett, PA-C  enoxaparin (LOVENOX) 40 MG/0.4ML injection Inject 0.4 mLs (40 mg total) into the skin daily. Patient not taking: Reported on 11/19/2016 10/31/16   Erin R Barrett, PA-C  folic acid-pyridoxine-cyancobalamin (FOLTX) 2.5-25-2 MG TABS tablet Take 1 tablet by mouth daily. Patient not taking: Reported on 11/19/2016 11/01/16   Erin R Barrett, PA-C  guaiFENesin (MUCINEX) 600 MG 12 hr tablet Take 1 tablet (600 mg total) by mouth 2 (two) times daily. Patient not taking: Reported on 11/19/2016 11/12/16   [name], MD  iron polysaccharides (NIFEREX) 150 MG capsule Take 1 capsule (150 mg total) by mouth daily. Patient not taking: Reported on 11/19/2016 11/01/16   Lodema Hong Barrett, PA-C    Family History    Family History  Problem Relation Age of Onset  . Cancer Mother     BREAST(BONE)  . Cancer Father     PANCREATIC  . Hypertension Maternal Grandmother   . Stroke Maternal Aunt   . Heart attack Neg Hx     Social History    Social History   Social History  . Marital status: Single    Spouse name: N/A  . Number of children: N/A  . Years of  education: N/A   Occupational History  . Not on file.   Social History Main Topics  . Smoking status: Former Smoker    Packs/Gonzalo: 1.00    Years: 32.00    Types: Cigarettes, E-cigarettes    Quit date: 07/06/2013  . Smokeless tobacco: Current User     Comment: smokes vapor cig. 3 times [place]  . Alcohol use 0.0 oz/week     Comment:  1 bottle of wine  . Drug use: No  . Sexual activity: No   Other Topics Concern  . Not on file   Social History Narrative   Epworth sleepiness scale as of 10/09/15 a 1     Review of Systems    General:  No chills, fever, night sweats or weight changes.  Cardiovascular: See HPI Dermatological: No rash, lesions/masses Respiratory: No cough, dyspnea Urologic: No hematuria, dysuria Abdominal:   No  nausea, vomiting, diarrhea, bright red blood per rectum, melena, or hematemesis Neurologic:  No visual changes, wkns, changes in mental status. All other systems reviewed and are otherwise negative except as noted above.  Physical Exam    Blood pressure 115/94, pulse 110, temperature 98.1 F (36.7 C), temperature source Oral, resp. rate 24, SpO2 99 %.  General: Pleasant AAM, NAD Psych: Normal affect. Neuro: Alert and oriented X 3. Moves all extremities spontaneously. HEENT: Normal  Neck: Supple without bruits or JVD. Lungs:  Resp regular and unlabored, Expiratory wheezing. Heart: RRR no s3, s4, prosthetic valve click or murmurs. Abdomen: Soft, non-tender, non-distended, BS + x 4.  Extremities: No clubbing, cyanosis or edema. DP/PT/Radials 2+ and equal bilaterally.  Labs    Troponin [facility] Test)  Recent Labs  11/19/16 1032  TROPIPOC 0.00   No results for input(s): CKTOTAL, CKMB, TROPONINI in the last 72 hours. Lab Results  Component Value Date   WBC 4.3 11/19/2016   HGB 12.0 (L) 11/19/2016   HCT 37.8 (L) 11/19/2016   MCV 78.9 11/19/2016   PLT 360 11/19/2016    Recent Labs Lab 11/19/16 1013  NA 136  K 5.8*  CL 96*  CO2 26  BUN  7  CREATININE 0.89  CALCIUM 9.4  GLUCOSE 111*   Lab Results  Component Value Date   CHOL 239 (H) 04/03/2016   HDL 126 04/03/2016   LDLCALC 100 (H) 04/03/2016   TRIG 63 04/03/2016   No results found for: [facility]   Radiology Studies    Dg Chest Portable 1 View  Result Date: 11/19/2016 CLINICAL DATA:  Tachycardia.  Shortness of breath. EXAM: PORTABLE CHEST 1 VIEW COMPARISON:  Previous examinations, the most recent dated 10/31/2016. Chest CTA dated 04/09/2016. FINDINGS: Normal sized heart. Stable prosthetic mitral valve. Small nodular densities in the right lung, most pronounced in the mid and lower lung zones lung zones and in the left lower lung zone, similar to previous examinations. Interval linear atelectasis or scarring in the right lower lung zone. Mild right glenohumeral joint degenerative changes. IMPRESSION: 1. Stable miliary pattern in both lungs, remaining greater on the right. 2. Interval linear atelectasis or scarring in the right lower lung zone. Electronically Signed   By: Claudie Revering M.D.   On: 11/19/2016 11:02   ECG & Cardiac Imaging    EKG: Initial shows SVT rate-138. Follow up with slower rate SR with 1st degree AV block.   Assessment & Plan    56 yo male with PMH of tobacco abuse, prior PE, and non-Hodgkin's lymphoma s/p chemotherapy and MVR (10/27/16) who presented to the ED with reports of sudden onset of tachycardia and dyspnea at 4am this morning.   1. Palpitations/dyspnea: Reports he was getting up around 4am and developed a sudden onset of palpitations and dyspnea. No chest pain, n/v, dizziness, or lightheadedness. In the ED initial EKG showed SVT, with follow up EKG with slower rate noting SR with known 1 degree AV block. Reports he has been compliant with home medications.  -- currently on metoprolol 12.5mg  BID, recommend increasing to 25mg  BID and having him follow up in the office next week.   2. MR s/p MVR: underwent valve replacement in 12/17 with Dr. Roxy Manns.  Has been feeling well since that time. Reports being compliant with medications.  -- does not appear to be volume overloaded on exam, BNP 71. INR today 3.8. States he has been compliant. Will have him hold his dose today. Take 2.5mg  tomorrow,  and Tuesday and 5mg  other days.  -- Will arrange for INR check next week.   3. Cough: Reports a productive cough over the past 2-3 weeks. Will send Rx for cough.   4. Hx of PE: on coumadin  Signed, Reino Bellis, NP-C Pager [phone] 11/19/2016, 3:49 PM

## 2016-11-19 NOTE — ED Provider Notes (Signed)
Cardiology has evaluated patient, feels stable for discharge.  Metoprolol increased to 25mg  daily.  INR is supratherapeutic so adjustments to coumadin dosing made.  Will follow-up in clinic.  Discharged home in stable condition.   Larene Pickett, PA-C 11/19/16 Yevette Edwards    Virgel Manifold, MD 11/29/16 [phone]

## 2016-11-19 NOTE — ED Notes (Signed)
EKG given to Dr. Isaacs 

## 2016-11-19 NOTE — ED Provider Notes (Signed)
[facility] DEPT Provider Note   CSN: [id number] Arrival date & time: 11/19/16  1001     History   Chief Complaint Chief Complaint  Patient presents with  . Shortness of Breath  . Tachycardia    HPI Leonard Bentley is a 56 y.o. male who presents with SOB and tachycardia. PMH significant for mitral valve prolapse and severe mitral regurgitation s/p recent repair (10/27/2016), non-Hodgkin's lymphoma diagnosed in 2012 treated with systemic chemotherapy, recurrent pulmonary embolism on Coumadin, and Mobitz type I second-degree AV block with frequent non-conducted PAC's. INR was 2.5 on Jan 2. He states he woke up this morning at 4 am and felt very lightheaded, short of breath, and had palpitations. He reports a headache as well and right sided chest soreness from where his incision is. Denies syncope. Some possible swelling in his right leg. No fever, chills, abdominal pain. He has been compliant with his medicines, including coumadin. Dr. Roxy Manns is cardiothoracic surgeon. Dr. Oval Linsey is primary cardiologist. Cath in Oct 2017 showed EF 55-65%   HPI  Past Medical History:  Diagnosis Date  . Acute pulmonary embolism ([place]) 04/11/2016  . Anxiety   . Arthritis   . Atrial tachycardia ([place]) 01/06/2016  . Cancer ([place]) 07/2011   STAGE II NON- HODGKIN LYMPHOMA  . Depression   . Dyspnea   . ED (erectile dysfunction)   . Lymphoma, non Hodgkin's 09/25/2011  . Mitral regurgitation 01/20/2016  . Mobitz I 10/09/2015  . Murmur 01/06/2016  . Occasional tremors   . PAC (premature atrial contraction) 10/09/2015  . Pulmonary emboli ([place])   . S/P minimally-invasive mitral valve repair 10/27/2016   Complex valvuloplasty including artificial Gore-tex neochord placement x6 and 34 mm Edwards Physio II ring annuloplasty via right mini thoracotomy approach    Patient Active Problem List   Diagnosis Date Noted  . S/P minimally-invasive mitral valve repair 10/27/2016  . Acute pulmonary embolism ([place]) 04/11/2016    . Left sided numbness   . Anxiety disorder 04/02/2016  . Panic attacks 04/02/2016  . Mitral valve prolapse   . Mitral regurgitation 01/20/2016  . Atrial tachycardia ([place]) 01/06/2016  . Murmur 01/06/2016  . Mobitz I 10/09/2015  . PAC (premature atrial contraction) 10/09/2015  . Arthritis 08/14/2015  . Neuropathy due to chemotherapeutic drug ([place]) 03/09/2013  . Leucopenia 01/31/2013  . Personal history of pulmonary embolism 12/28/2012  . Cigarette smoker 08/24/2012  . Hemorrhoid 03/08/2012  . Anemia 12/10/2011  . History of non-Hodgkin's lymphoma 09/25/2011    Past Surgical History:  Procedure Laterality Date  . CARDIAC CATHETERIZATION N/A 08/13/2016   Procedure: Right/Left Heart Cath and Coronary Angiography;  Surgeon: [name], MD;  Location: [facility];  Service: Cardiovascular;  Laterality: N/A;  . MITRAL VALVE REPAIR Right 10/27/2016   Procedure: MINIMALLY INVASIVE MITRAL VALVE REPAIR (MVR) USING 34 PHYSIO II ANNULOPLASTY RING;  Surgeon: [name], MD;  Location: [facility];  Service: Open Heart Surgery;  Laterality: Right;  . NO PAST SURGERIES    . TEE WITHOUT CARDIOVERSION N/A 02/11/2016   Procedure: TRANSESOPHAGEAL ECHOCARDIOGRAM (TEE);  Surgeon: [name], MD;  Location: [facility];  Service: Cardiovascular;  Laterality: N/A;  . TEE WITHOUT CARDIOVERSION N/A 10/27/2016   Procedure: TRANSESOPHAGEAL ECHOCARDIOGRAM (TEE);  Surgeon: [name], MD;  Location: [facility];  Service: Open Heart Surgery;  Laterality: N/A;  . TRIGGER FINGER RELEASE Bilateral      Home Medications    Prior to Admission medications   Medication Sig Start  Date End Date Taking? Authorizing Provider  acetaminophen (TYLENOL) 500 MG tablet Take 1,000 mg by mouth every 6 (six) hours as needed (for arthritis pain.).    Historical Provider, MD  aspirin EC 81 MG EC tablet Take 1 tablet (81 mg total) by mouth daily. 11/01/16   Erin R Barrett, PA-C  enoxaparin (LOVENOX) 40  MG/0.4ML injection Inject 0.4 mLs (40 mg total) into the skin daily. 10/31/16   Erin R Barrett, PA-C  fluticasone (FLONASE) 50 MCG/ACT nasal spray Place 1 spray into both nostrils 2 (two) times daily as needed (for nasal symptoms.).    Historical Provider, MD  folic acid-pyridoxine-cyancobalamin (FOLTX) 2.5-25-2 MG TABS tablet Take 1 tablet by mouth daily. 11/01/16   Erin R Barrett, PA-C  furosemide (LASIX) 40 MG tablet Take 1 tablet (40 mg total) by mouth daily. For 3 days 11/01/16   Lodema Hong Barrett, PA-C  guaiFENesin (MUCINEX) 600 MG 12 hr tablet Take 1 tablet (600 mg total) by mouth 2 (two) times daily. 11/12/16   [name], MD  iron polysaccharides (NIFEREX) 150 MG capsule Take 1 capsule (150 mg total) by mouth daily. 11/01/16   Erin R Barrett, PA-C  metoprolol succinate (TOPROL XL) 25 MG 24 hr tablet Take 0.5 tablets (12.5 mg total) by mouth daily. 10/31/16   Erin R Barrett, PA-C  oxyCODONE (OXY IR/ROXICODONE) 5 MG immediate release tablet Take 1-2 tablets (5-10 mg total) by mouth every 4 (four) hours as needed for severe pain. 11/10/16   Ivin Poot, MD  potassium chloride SA (K-DUR,KLOR-CON) 20 MEQ tablet Take 1 tablet (20 mEq total) by mouth daily. For 3 Days 11/01/16   Lodema Hong Barrett, PA-C  warfarin (COUMADIN) 5 MG tablet Take 1 tablet (5 mg total) by mouth daily at 6 PM. 10/31/16   Erin R Barrett, PA-C    Family History Family History  Problem Relation Age of Onset  . Cancer Mother     BREAST(BONE)  . Cancer Father     PANCREATIC  . Hypertension Maternal Grandmother   . Stroke Maternal Aunt   . Heart attack Neg Hx     Social History Social History  Substance Use Topics  . Smoking status: Former Smoker    Packs/Ju: 1.00    Years: 32.00    Types: Cigarettes, E-cigarettes    Quit date: 07/06/2013  . Smokeless tobacco: Current User     Comment: smokes vapor cig. 3 times [place]  . Alcohol use 0.0 oz/week     Comment:  1 bottle of wine     Allergies   Patient has no  known allergies.   Review of Systems Review of Systems  Constitutional: Negative for chills and fever.  Respiratory: Positive for shortness of breath. Negative for cough and wheezing.   Cardiovascular: Positive for chest pain and palpitations. Negative for leg swelling.  Gastrointestinal: Positive for nausea and vomiting. Negative for abdominal pain.  Neurological: Positive for light-headedness. Negative for syncope.     Physical Exam Updated Vital Signs There were no vitals taken for this visit.  Physical Exam  Constitutional: He is oriented to person, place, and time. He appears well-developed and well-nourished. He appears distressed (mild).  HENT:  Head: Normocephalic and atraumatic.  Eyes: Conjunctivae are normal. Pupils are equal, round, and reactive to light. Right eye exhibits no discharge. Left eye exhibits no discharge. No scleral icterus.  Neck: Normal range of motion.  Cardiovascular: Regular rhythm and intact distal pulses.  Tachycardia present.  Exam reveals  no gallop and no friction rub.   No murmur heard. Pulmonary/Chest: Effort normal and breath sounds normal. No respiratory distress. He has no wheezes. He has no rales. He exhibits tenderness.  Inicisonal site appears to be healing well. Some mild tenderness over site and just above it  Abdominal: Soft. Bowel sounds are normal. He exhibits no distension and no mass. There is no tenderness. There is no rebound and no guarding. No hernia.  Neurological: He is alert and oriented to person, place, and time.  Skin: Skin is warm and dry.  Psychiatric: He has a normal mood and affect. His behavior is normal.  Nursing note and vitals reviewed.    ED Treatments / Results  Labs (all labs ordered are listed, but only abnormal results are displayed) Labs Reviewed  BASIC METABOLIC PANEL - Abnormal; Notable for the following:       Result Value   Potassium 5.8 (*)    Chloride 96 (*)    Glucose, Bld 111 (*)    All other  components within normal limits  CBC - Abnormal; Notable for the following:    Hemoglobin 12.0 (*)    HCT 37.8 (*)    MCH 25.1 (*)    All other components within normal limits  PROTIME-INR - Abnormal; Notable for the following:    Prothrombin Time 38.9 (*)    All other components within normal limits  MAGNESIUM - Abnormal; Notable for the following:    Magnesium 1.6 (*)    All other components within normal limits  BRAIN NATRIURETIC PEPTIDE  I-STAT TROPOININ, ED    EKG  EKG Interpretation None       Radiology No results found.  Procedures Procedures (including critical care time)  Medications Ordered in ED Medications  sodium chloride 0.9 % bolus 1,000 mL (0 mLs Intravenous Stopped 11/19/16 1235)  magnesium sulfate IVPB 2 g 50 mL (0 g Intravenous Stopped 11/19/16 1250)  LORazepam (ATIVAN) injection 1 mg (1 mg Intravenous Given 11/19/16 1157)  metoprolol tartrate (LOPRESSOR) tablet 12.5 mg (12.5 mg Oral Given 11/19/16 1654)     Initial Impression / Assessment and Plan / ED Course  I have reviewed the triage vital signs and the nursing notes.  Pertinent labs & imaging results that were available during my care of the patient were reviewed by me and considered in my medical decision making (see chart for details).  Clinical Course    56 year old male presents with SVT vs A. Fib with initial HR in the 140s. Otherwise labs are normal. Possibly due to recent procedure. IVF, mg sulfate, ativan given. CBC remarkable for mild anemia, improved from baseline. CMP remarkable for hyperkalemia however sample appears hemolyzed. Troponin is 0. BNP is normal. After rate was slowed with vagal techniques it appears he is in SR. CXR negative. INR is supratherapeutic so low suspicion for PE. Consult to Cardiology placed. Pt reports subjective improvement after rate was slowed.  Cardiology consult pending. Will sign out patient to L Sanders PA-C at shift change who will determine dispo.  Final  Clinical Impressions(s) / ED Diagnoses   Final diagnoses:  SVT (supraventricular tachycardia) [facility])    New Prescriptions New Prescriptions   No medications on file     Recardo Evangelist, PA-C 11/20/16 0715    Duffy Bruce, MD 11/20/16 1306

## 2016-11-19 NOTE — Discharge Instructions (Signed)
Increase your metoprolol from 12.5 mg to 25 mg  Drink plenty of fluids  Follow-up with your doctor for INR check, as it was elevated today  Return to the ED with any new or concerning symptoms

## 2016-11-19 NOTE — ED Triage Notes (Signed)
Pt arrives [facility] with c/o sudden onset SHOB with exertion at 0400. Pt had mitral valve repair in December 2017. Pt had rate change of 112-140 per EMS with atrial fibrilation and no previous hx of same.

## 2016-11-22 ENCOUNTER — Ambulatory Visit: Payer: Self-pay

## 2016-11-23 ENCOUNTER — Encounter: Payer: Self-pay | Admitting: *Deleted

## 2016-11-23 ENCOUNTER — Ambulatory Visit: Payer: No Typology Code available for payment source | Admitting: Physician Assistant

## 2016-11-23 NOTE — Progress Notes (Unsigned)
This encounter was created in error - please disregard.

## 2016-11-23 NOTE — Progress Notes (Deleted)
Cardiology Office Note   Date:  11/23/2016   ID:  Leonard Bentley, DOB [birth year], MRN [id number]  PCP:  Wyatt Haste, MD  Cardiologist:  Dr. Barbara Cower, Suanne Marker, PA-C   No chief complaint on file.   History of Present Illness: Leonard Bentley is a 56 y.o. male with a history of tobacco abuse, prior PE on Coumadin, PAT/PACs, non-Hodgkin's lymphoma s/p chemotherapy and MV repair (10/27/16)   ER visit 11/19/2016 for atrial tach, metoprolol increased, Coumadin adjusted  Leonard Bentley presents for ***   Past Medical History:  Diagnosis Date  . Acute pulmonary embolism ([place]) 04/11/2016  . Anxiety   . Arthritis   . Atrial tachycardia ([place]) 01/06/2016  . Cancer ([place]) 07/2011   STAGE II NON- HODGKIN LYMPHOMA  . Depression   . Dyspnea   . ED (erectile dysfunction)   . Lymphoma, non Hodgkin's 09/25/2011  . Mitral regurgitation 01/20/2016  . Mobitz I 10/09/2015  . Murmur 01/06/2016  . Occasional tremors   . PAC (premature atrial contraction) 10/09/2015  . Pulmonary emboli ([place])   . S/P minimally-invasive mitral valve repair 10/27/2016   Complex valvuloplasty including artificial Gore-tex neochord placement x6 and 34 mm Edwards Physio II ring annuloplasty via right mini thoracotomy approach    Past Surgical History:  Procedure Laterality Date  . CARDIAC CATHETERIZATION N/A 08/13/2016   Procedure: Right/Left Heart Cath and Coronary Angiography;  Surgeon: [name], MD;  Location: [facility];  Service: Cardiovascular;  Laterality: N/A;  . MITRAL VALVE REPAIR Right 10/27/2016   Procedure: MINIMALLY INVASIVE MITRAL VALVE REPAIR (MVR) USING 34 PHYSIO II ANNULOPLASTY RING;  Surgeon: [name], MD;  Location: [facility];  Service: Open Heart Surgery;  Laterality: Right;  . NO PAST SURGERIES    . TEE WITHOUT CARDIOVERSION N/A 02/11/2016   Procedure: TRANSESOPHAGEAL ECHOCARDIOGRAM (TEE);  Surgeon: [name], MD;  Location: [facility];  Service: Cardiovascular;   Laterality: N/A;  . TEE WITHOUT CARDIOVERSION N/A 10/27/2016   Procedure: TRANSESOPHAGEAL ECHOCARDIOGRAM (TEE);  Surgeon: [name], MD;  Location: [facility];  Service: Open Heart Surgery;  Laterality: N/A;  . TRIGGER FINGER RELEASE Bilateral     Current Outpatient Prescriptions  Medication Sig Dispense Refill  . aspirin EC 81 MG EC tablet Take 1 tablet (81 mg total) by mouth daily. (Patient not taking: Reported on 11/19/2016)    . dextromethorphan 15 MG/5ML syrup Take 10 mLs (30 mg total) by mouth 4 (four) times daily as needed for cough. 120 mL 0  . enoxaparin (LOVENOX) 40 MG/0.4ML injection Inject 0.4 mLs (40 mg total) into the skin daily. (Patient not taking: Reported on 11/19/2016) 10 Syringe 1  . fluticasone (FLONASE) 50 MCG/ACT nasal spray Place 1 spray into both nostrils 2 (two) times daily as needed (for nasal symptoms.).    Marland Kitchen folic acid-pyridoxine-cyancobalamin (FOLTX) 2.5-25-2 MG TABS tablet Take 1 tablet by mouth daily. (Patient not taking: Reported on 11/19/2016) 30 each 0  . guaiFENesin (MUCINEX) 600 MG 12 hr tablet Take 1 tablet (600 mg total) by mouth 2 (two) times daily. (Patient not taking: Reported on 11/19/2016) 20 tablet 0  . iron polysaccharides (NIFEREX) 150 MG capsule Take 1 capsule (150 mg total) by mouth daily. (Patient not taking: Reported on 11/19/2016) 30 capsule 0  . metoprolol succinate (TOPROL XL) 25 MG 24 hr tablet Take 1 tablet (25 mg total) by mouth daily. 30 tablet 3  . oxyCODONE (OXY IR/ROXICODONE) 5 MG immediate release  tablet Take 1-2 tablets (5-10 mg total) by mouth every 4 (four) hours as needed for severe pain. 30 tablet 0  . warfarin (COUMADIN) 5 MG tablet Take 1 tablet (5 mg total) by mouth daily at 6 PM. 90 tablet 1   No current facility-administered medications for this visit.     Allergies:   Patient has no known allergies.    Social History:  The patient  reports that he quit smoking about 3 years ago. His smoking use included Cigarettes and  E-cigarettes. He has a 32.00 pack-year smoking history. He uses smokeless tobacco. He reports that he drinks alcohol. He reports that he does not use drugs.   Family History:  The patient's family history includes Cancer in his father and mother; Hypertension in his maternal grandmother; Stroke in his maternal aunt.    ROS:  Please see the history of present illness. All other systems are reviewed and negative.    PHYSICAL EXAM: VS:  There were no vitals taken for this visit. , BMI There is no height or weight on file to calculate BMI. GEN: Well nourished, well developed, male in no acute distress  HEENT: normal for age  Neck: no JVD, no carotid bruit, no masses Cardiac: RRR; no murmur, no rubs, or gallops Respiratory:  clear to auscultation bilaterally, normal work of breathing GI: soft, nontender, nondistended, + BS MS: no deformity or atrophy; no edema; distal pulses are 2+ in all 4 extremities   Skin: warm and dry, no rash Neuro:  Strength and sensation are intact Psych: euthymic mood, full affect   EKG:  EKG {ACTION; IS/IS GI:[id number] ordered today. The ekg ordered today demonstrates ***   Recent Labs: 10/25/2016: ALT 58 11/19/2016: B Natriuretic Peptide 71.3; BUN 7; Creatinine, Ser 0.89; Hemoglobin 12.0; Magnesium 1.6; Platelets 360; Potassium 5.8; Sodium 136    Lipid Panel    Component Value Date/Time   CHOL 239 (H) 04/03/2016 0323   CHOL 238 (H) 08/18/2015 0825   TRIG 63 04/03/2016 0323   HDL 126 04/03/2016 0323   HDL 124 08/18/2015 0825   CHOLHDL 1.9 04/03/2016 0323   VLDL 13 04/03/2016 0323   LDLCALC 100 (H) 04/03/2016 0323   LDLCALC 94 08/18/2015 0825     Wt Readings from Last 3 Encounters:  11/22/16 192 lb (87.1 kg)  10/31/16 191 lb 11.2 oz (87 kg)  10/25/16 185 lb 6.4 oz (84.1 kg)     Other studies Reviewed: Additional studies/ records that were reviewed today include: ***.  ASSESSMENT AND PLAN:  1.  ***   Current medicines are reviewed at  length with the patient today.  The patient {ACTIONS; HAS/DOES NOT HAVE:19233} concerns regarding medicines.  The following changes have been made:  {PLAN; NO CHANGE:13088:s}  Labs/ tests ordered today include: *** No orders of the defined types were placed in this encounter.    Disposition:   FU with ***  Signed, [name], PA-C  11/23/2016 8:25 AM    [facility] Phone: [phone]; Fax: [phone]  This note was written with the assistance of speech recognition software. Please excuse any transcriptional errors.

## 2016-11-26 ENCOUNTER — Ambulatory Visit: Payer: No Typology Code available for payment source | Admitting: Physician Assistant

## 2016-11-26 NOTE — Progress Notes (Deleted)
Cardiology Office Note    Date:  11/26/2016   ID:  Leonard Bentley, DOB 21-Aug-1961, MRN [id number]  PCP:  Wyatt Haste, MD  Cardiologist:  Dr. Oval Linsey  No chief complaint on file.   History of Present Illness:  Leonard Bentley is a 56 y.o. male with PMH of non-hodgkin lymphoma, h/o Mobitz I, h/o PE, and severe MR s/p MVR. He was initially evaluated by his PCP in December 2016 were noted he had Mobitz 1. He was referred for exercise Cardiolite that revealed EF 52% and no ischemia. He had echocardiogram in March 2017 showed EF 60-65% with moderate LVH and posterior mitral valve leaflet prolapse, and also showed moderate to severe mitral regurgitation. TEE confirmed moderate to severe MR. Left atrium was massively dilated. He was referred to Dr. [name] for consideration of mitral valve replacement/repair. Chest CT obtained as part of preoperative assessment in June 2017 noted PE in the right lower lobe. Apparently he had another episode of PE several years ago in the setting of testosterone therapy. He was started on Xarelto and accommodation indefinitely. He had follow-up CT in September that showed no further PE. She had a cardiac catheterization on 08/13/2016 showed EF 55-65%, moderate mitral valve prolapse, cardiac index 2.8, cardiac output 5.7, no significant coronary artery disease left main was aneurysmal in the mid to distal segment with a bend at the ostium. He eventually underwent minimally invasive complex valvuloplasty of mitral valve including artificial Gore-Tex neochord placement and Edwards Physio II Ring annuloplasty (size 47mm). Intraoperative TEE showed postop, his mean transmitral gradient was 3 mmHg. Postop, his Xarelto was switched to Coumadin instead. He was eventually discharged on 10/29/2016. He presented back to the hospital on 11/19/2016 with dyspnea and palpitation. He was found to have sinus versus atrial tachycardia with heart rate 1:30. This was subdued without any  intervention other than lorazepam. His metoprolol was increased to 25 mg BID. INR was noted to be supratherapeutic, his Coumadin level was reduced.  PT/INR Yes EKG    Past Medical History:  Diagnosis Date  . Acute pulmonary embolism ([place]) 04/11/2016  . Anxiety   . Arthritis   . Atrial tachycardia ([place]) 01/06/2016  . Cancer ([place]) 07/2011   STAGE II NON- HODGKIN LYMPHOMA  . Depression   . Dyspnea   . ED (erectile dysfunction)   . Lymphoma, non Hodgkin's 09/25/2011  . Mitral regurgitation 01/20/2016  . Mobitz I 10/09/2015  . Murmur 01/06/2016  . Occasional tremors   . PAC (premature atrial contraction) 10/09/2015  . Pulmonary emboli ([place])   . S/P minimally-invasive mitral valve repair 10/27/2016   Complex valvuloplasty including artificial Gore-tex neochord placement x6 and 34 mm Edwards Physio II ring annuloplasty via right mini thoracotomy approach    Past Surgical History:  Procedure Laterality Date  . CARDIAC CATHETERIZATION N/A 08/13/2016   Procedure: Right/Left Heart Cath and Coronary Angiography;  Surgeon: [name], MD;  Location: [facility];  Service: Cardiovascular;  Laterality: N/A;  . MITRAL VALVE REPAIR Right 10/27/2016   Procedure: MINIMALLY INVASIVE MITRAL VALVE REPAIR (MVR) USING 34 PHYSIO II ANNULOPLASTY RING;  Surgeon: [name], MD;  Location: [facility];  Service: Open Heart Surgery;  Laterality: Right;  . NO PAST SURGERIES    . TEE WITHOUT CARDIOVERSION N/A 02/11/2016   Procedure: TRANSESOPHAGEAL ECHOCARDIOGRAM (TEE);  Surgeon: [name], MD;  Location: [facility];  Service: Cardiovascular;  Laterality: N/A;  . TEE WITHOUT CARDIOVERSION N/A 10/27/2016   Procedure: TRANSESOPHAGEAL  ECHOCARDIOGRAM (TEE);  Surgeon: [name], MD;  Location: [facility];  Service: Open Heart Surgery;  Laterality: N/A;  . TRIGGER FINGER RELEASE Bilateral     Current Medications: Outpatient Medications Prior to Visit  Medication Sig Dispense Refill  . aspirin  EC 81 MG EC tablet Take 1 tablet (81 mg total) by mouth daily. (Patient not taking: Reported on 11/19/2016)    . dextromethorphan 15 MG/5ML syrup Take 10 mLs (30 mg total) by mouth 4 (four) times daily as needed for cough. 120 mL 0  . enoxaparin (LOVENOX) 40 MG/0.4ML injection Inject 0.4 mLs (40 mg total) into the skin daily. (Patient not taking: Reported on 11/19/2016) 10 Syringe 1  . fluticasone (FLONASE) 50 MCG/ACT nasal spray Place 1 spray into both nostrils 2 (two) times daily as needed (for nasal symptoms.).    Marland Kitchen folic acid-pyridoxine-cyancobalamin (FOLTX) 2.5-25-2 MG TABS tablet Take 1 tablet by mouth daily. (Patient not taking: Reported on 11/19/2016) 30 each 0  . guaiFENesin (MUCINEX) 600 MG 12 hr tablet Take 1 tablet (600 mg total) by mouth 2 (two) times daily. (Patient not taking: Reported on 11/19/2016) 20 tablet 0  . iron polysaccharides (NIFEREX) 150 MG capsule Take 1 capsule (150 mg total) by mouth daily. (Patient not taking: Reported on 11/19/2016) 30 capsule 0  . metoprolol succinate (TOPROL XL) 25 MG 24 hr tablet Take 1 tablet (25 mg total) by mouth daily. 30 tablet 3  . oxyCODONE (OXY IR/ROXICODONE) 5 MG immediate release tablet Take 1-2 tablets (5-10 mg total) by mouth every 4 (four) hours as needed for severe pain. 30 tablet 0  . warfarin (COUMADIN) 5 MG tablet Take 1 tablet (5 mg total) by mouth daily at 6 PM. 90 tablet 1   No facility-administered medications prior to visit.      Allergies:   Patient has no known allergies.   Social History   Social History  . Marital status: Single    Spouse name: N/A  . Number of children: N/A  . Years of education: N/A   Social History Main Topics  . Smoking status: Former Smoker    Packs/Fatula: 1.00    Years: 32.00    Types: Cigarettes, E-cigarettes    Quit date: 07/06/2013  . Smokeless tobacco: Current User     Comment: smokes vapor cig. 3 times [place]  . Alcohol use 0.0 oz/week     Comment:  1 bottle of wine  . Drug use: No  .  Sexual activity: No   Other Topics Concern  . Not on file   Social History Narrative   Epworth sleepiness scale as of 10/09/15 a 1     Family History:  The patient's ***family history includes Cancer in his father and mother; Hypertension in his maternal grandmother; Stroke in his maternal aunt.   ROS:   Please see the history of present illness.    ROS All other systems reviewed and are negative.   PHYSICAL EXAM:   VS:  There were no vitals taken for this visit.   GEN: Well nourished, well developed, in no acute distress  HEENT: normal  Neck: no JVD, carotid bruits, or masses Cardiac: ***RRR; no murmurs, rubs, or gallops,no edema  Respiratory:  clear to auscultation bilaterally, normal work of breathing GI: soft, nontender, nondistended, + BS MS: no deformity or atrophy  Skin: warm and dry, no rash Neuro:  Alert and Oriented x 3, Strength and sensation are intact Psych: euthymic mood, full affect  Wt Readings  from Last 3 Encounters:  10/31/16 191 lb 11.2 oz (87 kg)  10/25/16 185 lb 6.4 oz (84.1 kg)  10/18/16 195 lb (88.5 kg)      Studies/Labs Reviewed:   EKG:  EKG is*** ordered today.  The ekg ordered today demonstrates ***  Recent Labs: 10/25/2016: ALT 58 11/19/2016: B Natriuretic Peptide 71.3; BUN 7; Creatinine, Ser 0.89; Hemoglobin 12.0; Magnesium 1.6; Platelets 360; Potassium 5.8; Sodium 136   Lipid Panel    Component Value Date/Time   CHOL 239 (H) 04/03/2016 0323   CHOL 238 (H) 08/18/2015 0825   TRIG 63 04/03/2016 0323   HDL 126 04/03/2016 0323   HDL 124 08/18/2015 0825   CHOLHDL 1.9 04/03/2016 0323   VLDL 13 04/03/2016 0323   LDLCALC 100 (H) 04/03/2016 0323   LDLCALC 94 08/18/2015 0825    Additional studies/ records that were reviewed today include:   Cath 08/13/2016 Conclusion     The left ventricular systolic function is normal.  LV end diastolic pressure is normal.  The left ventricular ejection fraction is 55-65% by visual  estimate.  There is moderate mitral valve prolapse.  LV end diastolic pressure is normal.  Normal right heart pressures. CO 5.7 L/min. Cardiac index 2.8. PA sat 65%.  Tortuous abdominal aorta without aneurysm.  No significant coronary artery disease. 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   Restart Xarelto tomorrow.  Aspirin also continued per his home meds.  Followup with Dr. Oval Linsey.      Mitral valve repair by Dr. Roxy Manns 10/27/2016  Minimally-Invasive Mitral Valve Repair             Complex valvuloplasty including artificial Gore-tex neochord placement x6             Edwards Physio II Ring Annuloplasty (size 98mm, model # [id number], serial # [id number])               Surgeon:        Valentina Gu. 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 Aortic valve: The aortic valve was unchanged from the pre-bypass study. The leaflets were thin and pliable and opened normally without restriction. There was no aortic insufficiency.  2. Mitral valve: There was an annuloplasty ring in the mitral position. The mean transmitral gradient was 3 mmHg.   3. Left ventricle: Left ventricular cavity was reduced in size and measured 5.1 cm at end diastole at the mid-papillary level in the transgastric view. There was normal appearing systolic function and the ejection fraction was estimated at 55-60%. There were no regional wall motion abnormalities detected.   4. Right ventricle: The right ventricular cavity again  appeared moderately mild to moderately enlarged. There was  reduced contractility the right ventricular free wall and infundibulum which appeared unchanged from the pre-bypass study.   5. Tricuspid valve: The tricuspid valve appeared structurally normal with trace tricuspid insufficiency.   ASSESSMENT:    No diagnosis found.   PLAN:  In order of problems listed above:  1. ***  Medication Adjustments/Labs and Tests Ordered: Current medicines are reviewed at length with the patient today.  Concerns regarding medicines are outlined above.  Medication changes, Labs and Tests ordered today are listed in the Patient Instructions below. There are no Patient Instructions on file for this visit.   Hilbert Corrigan, [place]  11/26/2016 7:09 AM    [address], [place], [place]  [address] Phone: [phone]; Fax: [phone]

## 2016-11-27 ENCOUNTER — Emergency Department (HOSPITAL_COMMUNITY): Payer: No Typology Code available for payment source

## 2016-11-27 ENCOUNTER — Encounter (HOSPITAL_COMMUNITY): Payer: Self-pay | Admitting: Emergency Medicine

## 2016-11-27 ENCOUNTER — Observation Stay (HOSPITAL_COMMUNITY)
Admission: EM | Admit: 2016-11-27 | Discharge: 2016-11-28 | Disposition: A | Discharge status: Home / Self Care | Payer: No Typology Code available for payment source | Attending: Internal Medicine | Admitting: Internal Medicine

## 2016-11-27 DIAGNOSIS — Z86711 Personal history of pulmonary embolism: Secondary | ICD-10-CM | POA: Diagnosis present

## 2016-11-27 DIAGNOSIS — Z8249 Family history of ischemic heart disease and other diseases of the circulatory system: Secondary | ICD-10-CM | POA: Insufficient documentation

## 2016-11-27 DIAGNOSIS — E860 Dehydration: Secondary | ICD-10-CM | POA: Diagnosis present

## 2016-11-27 DIAGNOSIS — Z7901 Long term (current) use of anticoagulants: Secondary | ICD-10-CM | POA: Insufficient documentation

## 2016-11-27 DIAGNOSIS — F17210 Nicotine dependence, cigarettes, uncomplicated: Secondary | ICD-10-CM | POA: Diagnosis not present

## 2016-11-27 DIAGNOSIS — Z9221 Personal history of antineoplastic chemotherapy: Secondary | ICD-10-CM | POA: Diagnosis not present

## 2016-11-27 DIAGNOSIS — I441 Atrioventricular block, second degree: Secondary | ICD-10-CM | POA: Diagnosis not present

## 2016-11-27 DIAGNOSIS — Z7951 Long term (current) use of inhaled steroids: Secondary | ICD-10-CM | POA: Insufficient documentation

## 2016-11-27 DIAGNOSIS — F329 Major depressive disorder, single episode, unspecified: Secondary | ICD-10-CM | POA: Diagnosis not present

## 2016-11-27 DIAGNOSIS — K7689 Other specified diseases of liver: Secondary | ICD-10-CM | POA: Insufficient documentation

## 2016-11-27 DIAGNOSIS — F419 Anxiety disorder, unspecified: Secondary | ICD-10-CM | POA: Diagnosis not present

## 2016-11-27 DIAGNOSIS — T451X5A Adverse effect of antineoplastic and immunosuppressive drugs, initial encounter: Secondary | ICD-10-CM | POA: Diagnosis present

## 2016-11-27 DIAGNOSIS — R112 Nausea with vomiting, unspecified: Secondary | ICD-10-CM | POA: Diagnosis not present

## 2016-11-27 DIAGNOSIS — G620 Drug-induced polyneuropathy: Secondary | ICD-10-CM | POA: Diagnosis present

## 2016-11-27 DIAGNOSIS — N529 Male erectile dysfunction, unspecified: Secondary | ICD-10-CM | POA: Diagnosis not present

## 2016-11-27 DIAGNOSIS — Z803 Family history of malignant neoplasm of breast: Secondary | ICD-10-CM | POA: Insufficient documentation

## 2016-11-27 DIAGNOSIS — F1010 Alcohol abuse, uncomplicated: Secondary | ICD-10-CM | POA: Diagnosis not present

## 2016-11-27 DIAGNOSIS — I2782 Chronic pulmonary embolism: Secondary | ICD-10-CM | POA: Diagnosis not present

## 2016-11-27 DIAGNOSIS — R791 Abnormal coagulation profile: Secondary | ICD-10-CM | POA: Diagnosis present

## 2016-11-27 DIAGNOSIS — F10231 Alcohol dependence with withdrawal delirium: Secondary | ICD-10-CM | POA: Diagnosis not present

## 2016-11-27 DIAGNOSIS — Z7982 Long term (current) use of aspirin: Secondary | ICD-10-CM | POA: Insufficient documentation

## 2016-11-27 DIAGNOSIS — Z8572 Personal history of non-Hodgkin lymphomas: Secondary | ICD-10-CM

## 2016-11-27 DIAGNOSIS — I951 Orthostatic hypotension: Principal | ICD-10-CM | POA: Insufficient documentation

## 2016-11-27 DIAGNOSIS — F411 Generalized anxiety disorder: Secondary | ICD-10-CM | POA: Diagnosis not present

## 2016-11-27 DIAGNOSIS — M1990 Unspecified osteoarthritis, unspecified site: Secondary | ICD-10-CM | POA: Diagnosis not present

## 2016-11-27 DIAGNOSIS — Z952 Presence of prosthetic heart valve: Secondary | ICD-10-CM | POA: Insufficient documentation

## 2016-11-27 DIAGNOSIS — I491 Atrial premature depolarization: Secondary | ICD-10-CM | POA: Diagnosis not present

## 2016-11-27 DIAGNOSIS — Z823 Family history of stroke: Secondary | ICD-10-CM | POA: Insufficient documentation

## 2016-11-27 DIAGNOSIS — Z808 Family history of malignant neoplasm of other organs or systems: Secondary | ICD-10-CM | POA: Diagnosis not present

## 2016-11-27 DIAGNOSIS — I471 Supraventricular tachycardia: Secondary | ICD-10-CM | POA: Diagnosis not present

## 2016-11-27 LAB — RAPID URINE DRUG SCREEN, HOSP PERFORMED
Amphetamines: NOT DETECTED
Barbiturates: NOT DETECTED
Benzodiazepines: NOT DETECTED
Cocaine: NOT DETECTED
Opiates: POSITIVE — AB
Tetrahydrocannabinol: NOT DETECTED

## 2016-11-27 LAB — COMPREHENSIVE METABOLIC PANEL
ALT: 19 U/L (ref 17–63)
AST: 30 U/L (ref 15–41)
Albumin: 3.6 g/dL (ref 3.5–5.0)
Alkaline Phosphatase: 57 U/L (ref 38–126)
Anion gap: 14 (ref 5–15)
BUN: 5 mg/dL — ABNORMAL LOW (ref 6–20)
CO2: 21 mmol/L — ABNORMAL LOW (ref 22–32)
Calcium: 9.4 mg/dL (ref 8.9–10.3)
Chloride: 103 mmol/L (ref 101–111)
Creatinine, Ser: 0.89 mg/dL (ref 0.61–1.24)
GFR calc Af Amer: >60 mL/min (ref >60)
GFR calc non Af Amer: >60 mL/min (ref >60)
Glucose, Bld: 133 mg/dL — ABNORMAL HIGH (ref 65–99)
Potassium: 4.6 mmol/L (ref 3.5–5.1)
Sodium: 138 mmol/L (ref 135–145)
Total Bilirubin: 0.6 mg/dL (ref 0.3–1.2)
Total Protein: 7.1 g/dL (ref 6.5–8.1)

## 2016-11-27 LAB — CBC
HCT: 38.9 % — ABNORMAL LOW (ref 39.0–52.0)
Hemoglobin: 12.1 g/dL — ABNORMAL LOW (ref 13.0–17.0)
MCH: 25.3 pg — ABNORMAL LOW (ref 26.0–34.0)
MCHC: 31.1 g/dL (ref 30.0–36.0)
MCV: 81.4 fL (ref 78.0–100.0)
Platelets: 246 10*3/uL (ref 150–400)
RBC: 4.78 MIL/uL (ref 4.22–5.81)
RDW: 16.4 % — ABNORMAL HIGH (ref 11.5–15.5)
WBC: 4.4 10*3/uL (ref 4.0–10.5)

## 2016-11-27 LAB — I-STAT TROPONIN, ED: Troponin i, poc: 0.01 ng/mL (ref 0.00–0.08)

## 2016-11-27 LAB — ETHANOL: Alcohol, Ethyl (B): 6 mg/dL — ABNORMAL HIGH (ref <5)

## 2016-11-27 LAB — PROTIME-INR
INR: 10.32
Prothrombin Time: 85.4 seconds — ABNORMAL HIGH (ref 11.4–15.2)

## 2016-11-27 LAB — TROPONIN I: Troponin I: <0.03 ng/mL (ref <0.03)

## 2016-11-27 MED ORDER — THIAMINE HCL 100 MG/ML IJ SOLN
Freq: Once | INTRAVENOUS | Status: AC
  Administered 2016-11-27: 15:00:00 via INTRAVENOUS
  Filled 2016-11-27: qty 1000

## 2016-11-27 MED ORDER — SODIUM CHLORIDE 0.9 % IV SOLN
INTRAVENOUS | Status: DC
  Administered 2016-11-27: 23:00:00 via INTRAVENOUS

## 2016-11-27 MED ORDER — ONDANSETRON HCL 4 MG/2ML IJ SOLN
4.0000 mg | Freq: Four times a day (QID) | INTRAMUSCULAR | Status: DC | PRN
Start: 2016-11-27 — End: 2016-11-28

## 2016-11-27 MED ORDER — LORAZEPAM 1 MG PO TABS: 1.0000 mg | ORAL_TABLET | Freq: Four times a day (QID) | ORAL | Status: DC | PRN

## 2016-11-27 MED ORDER — LORAZEPAM 2 MG/ML IJ SOLN
0.0000 mg | Freq: Four times a day (QID) | INTRAMUSCULAR | Status: DC
  Administered 2016-11-28: 1 mg via INTRAVENOUS
  Filled 2016-11-27: qty 1

## 2016-11-27 MED ORDER — SODIUM CHLORIDE 0.9 % IV BOLUS (SEPSIS)
1000.0000 mL | Freq: Once | INTRAVENOUS | Status: AC
  Administered 2016-11-27: 1000 mL via INTRAVENOUS

## 2016-11-27 MED ORDER — METOPROLOL SUCCINATE ER 25 MG PO TB24: 25.0000 mg | ORAL_TABLET | Freq: Every day | ORAL | Status: DC

## 2016-11-27 MED ORDER — ONDANSETRON HCL 4 MG PO TABS: 4.0000 mg | ORAL_TABLET | Freq: Four times a day (QID) | ORAL | Status: DC | PRN

## 2016-11-27 MED ORDER — CHLORDIAZEPOXIDE HCL 25 MG PO CAPS
25.0000 mg | ORAL_CAPSULE | Freq: Once | ORAL | Status: AC
  Administered 2016-11-27: 25 mg via ORAL
  Filled 2016-11-27: qty 1

## 2016-11-27 MED ORDER — VITAMIN B-1 100 MG PO TABS
100.0000 mg | ORAL_TABLET | Freq: Every day | ORAL | Status: DC
  Administered 2016-11-28: 100 mg via ORAL
  Filled 2016-11-27: qty 1

## 2016-11-27 MED ORDER — THIAMINE HCL 100 MG/ML IJ SOLN: 100.0000 mg | Freq: Every day | INTRAMUSCULAR | Status: DC

## 2016-11-27 MED ORDER — ACETAMINOPHEN 325 MG PO TABS: 650.0000 mg | ORAL_TABLET | Freq: Four times a day (QID) | ORAL | Status: DC | PRN

## 2016-11-27 MED ORDER — OXYCODONE HCL 5 MG PO TABS: 5.0000 mg | ORAL_TABLET | ORAL | Status: DC | PRN

## 2016-11-27 MED ORDER — LORAZEPAM 2 MG/ML IJ SOLN: 1.0000 mg | Freq: Four times a day (QID) | INTRAMUSCULAR | Status: DC | PRN

## 2016-11-27 MED ORDER — LORAZEPAM 2 MG/ML IJ SOLN: 0.0000 mg | Freq: Two times a day (BID) | INTRAMUSCULAR | Status: DC

## 2016-11-27 MED ORDER — SODIUM CHLORIDE 0.9% FLUSH: 3.0000 mL | Freq: Two times a day (BID) | INTRAVENOUS | Status: DC

## 2016-11-27 MED ORDER — FOLIC ACID 1 MG PO TABS
1.0000 mg | ORAL_TABLET | Freq: Every day | ORAL | Status: DC
  Administered 2016-11-28: 1 mg via ORAL
  Filled 2016-11-27: qty 1

## 2016-11-27 MED ORDER — IOPAMIDOL (ISOVUE-370) INJECTION 76%
INTRAVENOUS | Status: AC
  Administered 2016-11-27: 100 mL
  Filled 2016-11-27: qty 100

## 2016-11-27 MED ORDER — ACETAMINOPHEN 650 MG RE SUPP: 650.0000 mg | Freq: Four times a day (QID) | RECTAL | Status: DC | PRN

## 2016-11-27 MED ORDER — ADULT MULTIVITAMIN W/MINERALS CH
1.0000 | ORAL_TABLET | Freq: Every day | ORAL | Status: DC
  Administered 2016-11-28: 1 via ORAL
  Filled 2016-11-27: qty 1

## 2016-11-27 MED ORDER — LORAZEPAM 2 MG/ML IJ SOLN
1.0000 mg | Freq: Once | INTRAMUSCULAR | Status: AC
  Administered 2016-11-27: 1 mg via INTRAVENOUS
  Filled 2016-11-27: qty 1

## 2016-11-27 NOTE — H&P (Signed)
Leonard Bentley [id number] DOB: [birth year] DOA: 11/27/2016     PCP: Leonard Haste, MD   Outpatient Specialists: CT surgery Leonard Bentley Patient coming from: home Lives alone,        Chief Complaint: lightheaded  HPI: Leonard Bentley is a 56 y.o. male with medical history significant of mitral valve prolapse and severe mitral regurgitation sp Angioplasty, stage II large B cell non-Hodgkin's lymphoma diagnosed in 2012 treated with systemic chemotherapy, recurrent pulmonary embolism on long-term anticoagulation, and Mobitz type I second-degree AV block with frequent non-conducted PAC's      Presented with drinking 1-1/2 L of wine yesterday reports vomiting and being anxious reporting some tremors. He have had similar presentation secondary to chronic setting of past patient usually takes Xanax and add above as needed but has been out of his medications for a few months. Feels lightheaded especially when he stands up. This morning he woke up vomiting 2 he's been having right-sided chest pain ever since his procedure when he had his mitral valve repair. Otherwise no shortness of breath abdominal pain   Denies diarrhea.  Regarding pertinent Chronic problems:  In December 2016 He was referred for exercise Cardiolite that revealed LVEF 52% and no ischemia. On 01/20/16 he had an echo that was revealed LVEF 60-65% with moderate LVH and posterior mitral valve leaflet prolapse. There was moderate to severe mitral regurgitation. He was referred for transesophageal echocardiography which confirmed moderate to severe mitral regurgitation. The left atrium was massively dilated but there was no left ventricle dilation. Pulmonary pressures were normal. He was referred to Dr. Ricard Bentley for consideration of mitral valve replacement/repair. He had a chest CT on 04/09/16 for preoperative assessment and was noted to have a pulmonary embolism in the right lower lobe. Leonard Bentley had a PE several years ago in the setting of  testosterone therapy. He was started on Xarelto anticoagulation indefinitely. He had a follow up CT 07/20/16 that showed no acute thrombus and resolution of most of the prior PE. There was a minimal, chronic linear thrombus in the RLL On 10/27/2016 Leonard Bentley underwent a minimally invasive mitral valve repair with Dr. Roxy Bentley Cardiac cath was negative.  Patient has known history of lymphoma followed by oncology sp chemotherapy now in remission.  IN ER:  Temp (24hrs), Avg:97.6 F (36.4 C), Min:97.6 F (36.4 C), Max:97.6 F (36.4 C)    Noted to be orthostatic while in emergency department BP 108/84 Troponin 0.01 INR 10.32 U tox positive for opioids Sodium 138 BUN 5  WBC 4.4 hemoglobin 12.1   CT chest showing no acute PE but there is a chronic P within the right lower lobe pulmonary artery evidence of lymphadenopathy compatible with history lymphoma  Following Medications were ordered in ER: Medications  LORazepam (ATIVAN) injection 1 mg (not administered)  sodium chloride 0.9 % 1,000 mL with thiamine [id number] mg, folic acid 1 mg, multivitamins adult 10 mL infusion ( Intravenous New Bag/Given 11/27/16 1512)  chlordiazePOXIDE (LIBRIUM) capsule 25 mg (25 mg Oral Given 11/27/16 1504)  sodium chloride 0.9 % bolus 1,000 mL (0 mLs Intravenous Stopped 11/27/16 1654)  iopamidol (ISOVUE-370) 76 % injection (100 mLs  Contrast Given 11/27/16 1705)     discussed case with:  Cardiology stated that given mitral valve repair patient should be on anticoagulation with Coumadin for 6 weeks but in case of bleeding complication this could be discontinued  Hospitalist was called for admission for dehydration and lightheadedness and supratherapeutic INR  Review of Systems:  Pertinent positives include: lightheaded, nausea, vomiting,  Constitutional:  No weight loss, night sweats, Fevers, chills, fatigue, weight loss  HEENT:  No headaches, Difficulty swallowing,Tooth/dental problems,Sore throat,  No sneezing, itching,  ear ache, nasal congestion, post nasal drip,  Cardio-vascular:  No chest pain, Orthopnea, PND, anasarca, dizziness, palpitations.no Bilateral lower extremity swelling  GI:  No heartburn, indigestion, abdominal pain diarrhea, change in bowel habits, loss of appetite, melena, blood in stool, hematemesis Resp:  no shortness of breath at rest. No dyspnea on exertion, No excess mucus, no productive cough, No non-productive cough, No coughing up of blood.No change in color of mucus.No wheezing. Skin:  no rash or lesions. No jaundice GU:  no dysuria, change in color of urine, no urgency or frequency. No straining to urinate.  No flank pain.  Musculoskeletal:  No joint pain or no joint swelling. No decreased range of motion. No back pain.  Psych:  No change in mood or affect. No depression or anxiety. No memory loss.  Neuro: no localizing neurological complaints, no tingling, no weakness, no double vision, no gait abnormality, no slurred speech, no confusion  As per HPI otherwise 10 point review of systems negative.   Past Medical History: Past Medical History:  Diagnosis Date  . Acute pulmonary embolism ([facility]) 04/11/2016  . Anxiety   . Arthritis   . Atrial tachycardia ([facility]) 01/06/2016  . Cancer ([facility]) 07/2011   STAGE II NON- HODGKIN LYMPHOMA  . Depression   . Dyspnea   . ED (erectile dysfunction)   . Lymphoma, non Hodgkin's 09/25/2011  . Mitral regurgitation 01/20/2016  . Mobitz I 10/09/2015  . Murmur 01/06/2016  . Occasional tremors   . PAC (premature atrial contraction) 10/09/2015  . Pulmonary emboli ([facility])   . S/P minimally-invasive mitral valve repair 10/27/2016   Complex valvuloplasty including artificial Gore-tex neochord placement x6 and 34 mm Edwards Physio II ring annuloplasty via right mini thoracotomy approach   Past Surgical History:  Procedure Laterality Date  . CARDIAC CATHETERIZATION N/A 08/13/2016   Procedure: Right/Left Heart Cath and Coronary Angiography;  Surgeon:  Leonard Booze, MD;  Location: [facility];  Service: Cardiovascular;  Laterality: N/A;  . MITRAL VALVE REPAIR Right 10/27/2016   Procedure: MINIMALLY INVASIVE MITRAL VALVE REPAIR (MVR) USING 34 PHYSIO II ANNULOPLASTY RING;  Surgeon: [name], MD;  Location: [facility];  Service: Open Heart Surgery;  Laterality: Right;  . NO PAST SURGERIES    . TEE WITHOUT CARDIOVERSION N/A 02/11/2016   Procedure: TRANSESOPHAGEAL ECHOCARDIOGRAM (TEE);  Surgeon: [name], MD;  Location: [facility];  Service: Cardiovascular;  Laterality: N/A;  . TEE WITHOUT CARDIOVERSION N/A 10/27/2016   Procedure: TRANSESOPHAGEAL ECHOCARDIOGRAM (TEE);  Surgeon: [name], MD;  Location: [name];  Service: Open Heart Surgery;  Laterality: N/A;  . TRIGGER FINGER RELEASE Bilateral      Social History:  Ambulatory  independently      reports that he quit smoking about 3 years ago. His smoking use included Cigarettes and E-cigarettes. He has a 32.00 pack-year smoking history. He uses smokeless tobacco. He reports that he drinks alcohol. He reports that he does not use drugs.  Allergies:  No Known Allergies     Family History:   Family History  Problem Relation Age of Onset  . Cancer Mother     BREAST(BONE)  . Cancer Father     PANCREATIC  . Hypertension Maternal Grandmother   . Stroke Maternal Aunt   . Heart attack Neg Hx  Medications: Prior to Admission medications   Medication Sig Start Date End Date Taking? Authorizing Provider  Chlorphen-Pseudoephed-APAP (CORICIDIN D PO) Take 1 tablet by mouth daily as needed (cold).   Yes Historical Provider, MD  fluticasone (FLONASE) 50 MCG/ACT nasal spray Place 1 spray into both nostrils 2 (two) times daily as needed (for nasal symptoms.).   Yes Historical Provider, MD  metoprolol succinate (TOPROL XL) 25 MG 24 hr tablet Take 1 tablet (25 mg total) by mouth daily. 11/19/16  Yes Duffy Bruce, MD  oxyCODONE (OXY IR/ROXICODONE) 5 MG immediate  release tablet Take 1-2 tablets (5-10 mg total) by mouth every 4 (four) hours as needed for severe pain. 11/10/16  Yes Ivin Poot, MD  warfarin (COUMADIN) 5 MG tablet Take 1 tablet (5 mg total) by mouth daily at 6 PM. 10/31/16  Yes Erin R Barrett, PA-C  aspirin EC 81 MG EC tablet Take 1 tablet (81 mg total) by mouth daily. Patient not taking: Reported on 11/19/2016 11/01/16   Erin R Barrett, PA-C  dextromethorphan 15 MG/5ML syrup Take 10 mLs (30 mg total) by mouth 4 (four) times daily as needed for cough. Patient not taking: Reported on 11/27/2016 11/19/16   Cheryln Manly, NP  enoxaparin (LOVENOX) 40 MG/0.4ML injection Inject 0.4 mLs (40 mg total) into the skin daily. Patient not taking: Reported on 11/19/2016 10/31/16   Erin R Barrett, PA-C  folic acid-pyridoxine-cyancobalamin (FOLTX) 2.5-25-2 MG TABS tablet Take 1 tablet by mouth daily. Patient not taking: Reported on 11/19/2016 11/01/16   Erin R Barrett, PA-C  guaiFENesin (MUCINEX) 600 MG 12 hr tablet Take 1 tablet (600 mg total) by mouth 2 (two) times daily. Patient not taking: Reported on 11/19/2016 11/12/16   [name], MD  iron polysaccharides (NIFEREX) 150 MG capsule Take 1 capsule (150 mg total) by mouth daily. Patient not taking: Reported on 11/19/2016 11/01/16   Freddrick March, PA-C    Physical Exam: Patient Vitals for the past 24 hrs:  BP Temp Temp src Pulse Resp SpO2 Height Weight  11/27/16 1846 - - - (!) 56 14 100 % - -  11/27/16 1801 - - - - - 100 % - -  11/27/16 1800 108/84 - - 72 12 100 % - -  11/27/16 1545 - - - 88 12 98 % - -  11/27/16 1544 120/87 - - - - - - -  11/27/16 1500 114/93 - - 84 19 100 % - -  11/27/16 1400 114/83 - - 87 12 100 % - -  11/27/16 1354 110/84 - - 97 12 94 % - -  11/27/16 1350 110/84 - - 113 - - - -  11/27/16 1243 109/75 - - 102 12 100 % - -  11/27/16 1120 - - - - - - 5\' 9"  (1.753 m) 86.2 kg (190 lb)  11/27/16 1119 - - - - - 98 % - -  11/27/16 1109 (!) 229/203 97.6 F (36.4 C) Oral (!) 58  16 100 % - -    1. General:  in No Acute distress 2. Psychological: Alert and  Oriented 3. Head/ENT:    Dry Mucous Membranes                          Head Non traumatic, neck supple                          Normal   Dentition  4. SKIN:   decreased Skin turgor,  Skin clean Dry and intact no rash Well. Incision site sutures in place 5. Heart: Regular rate and rhythm Murmur, Rub or gallop 6. Lungs:  clear to auscultation bilaterally, no wheezes or crackles   7. Abdomen: Soft,  non-tender, Non distended 8. Lower extremities: no clubbing, cyanosis, or edema 9. Neurologically Grossly intact, moving all 4 extremities equally  10. MSK: Normal range of motion   body mass index is 28.06 kg/m.  Labs on Admission:   Labs on Admission: I have personally reviewed following labs and imaging studies  CBC:  Recent Labs Lab 11/27/16 1124  WBC 4.4  HGB 12.1*  HCT 38.9*  MCV 81.4  PLT 0000000   Basic Metabolic Panel:  Recent Labs Lab 11/27/16 1124  NA 138  K 4.6  CL 103  CO2 21*  GLUCOSE 133*  BUN 5*  CREATININE 0.89  CALCIUM 9.4   GFR: Estimated Creatinine Clearance: 99.6 mL/min (by C-G formula based on SCr of 0.89 mg/dL). Liver Function Tests:  Recent Labs Lab 11/27/16 1124  AST 30  ALT 19  ALKPHOS 57  BILITOT 0.6  PROT 7.1  ALBUMIN 3.6   No results for input(s): LIPASE, AMYLASE in the last 168 hours. No results for input(s): AMMONIA in the last 168 hours. Coagulation Profile:  Recent Labs Lab 11/27/16 1620  INR 10.32*   Cardiac Enzymes: No results for input(s): CKTOTAL, CKMB, CKMBINDEX, TROPONINI in the last 168 hours. BNP (last 3 results) No results for input(s): PROBNP in the last 8760 hours. HbA1C: No results for input(s): HGBA1C in the last 72 hours. CBG: No results for input(s): GLUCAP in the last 168 hours. Lipid Profile: No results for input(s): CHOL, HDL, LDLCALC, TRIG, CHOLHDL, LDLDIRECT in the last 72 hours. Thyroid Function Tests: No results for  input(s): TSH, T4TOTAL, FREET4, T3FREE, THYROIDAB in the last 72 hours. Anemia Panel: No results for input(s): VITAMINB12, FOLATE, FERRITIN, TIBC, IRON, RETICCTPCT in the last 72 hours.  Sepsis Labs: @LABRCNTIP (procalcitonin:4,lacticidven:4) )No results found for this or any previous visit (from the past 240 hour(s)).    UA  not ordered  Lab Results  Component Value Date   HGBA1C 5.2 10/25/2016    Estimated Creatinine Clearance: 99.6 mL/min (by C-G formula based on SCr of 0.89 mg/dL).  BNP (last 3 results) No results for input(s): PROBNP in the last 8760 hours.   ECG REPORT  Independently reviewed Rate: 56  Rhythm: Sinus rhythm ST&T Change: No acute ischemic changes   QTC 451   Filed Weights   11/27/16 1120  Weight: 86.2 kg (190 lb)     Cultures: No results found for: SDES, SPECREQUEST, CULT, REPTSTATUS   Radiological Exams on Admission: Ct Head Wo Contrast  Result Date: 11/27/2016 CLINICAL DATA:  Patient has had a headache for the past couple of days, with N/V. "he has increased anxiety and tremors today" Patient unable to hold still for exam EXAM: CT HEAD WITHOUT CONTRAST TECHNIQUE: Contiguous axial images were obtained from the base of the skull through the vertex without intravenous contrast. COMPARISON:  Head CT 04/02/2016 FINDINGS: Brain: No acute intracranial hemorrhage. No focal mass lesion. No CT evidence of acute infarction. No midline shift or mass effect. No hydrocephalus. Basilar cisterns are patent. Vascular: No hyperdense vessel or unexpected calcification. Skull: Normal. Negative for fracture or focal lesion. Sinuses/Orbits: Paranasal sinuses and mastoid air cells are clear. Orbits are clear. Other: None. IMPRESSION: No acute intracranial findings.  No change from prior  head CT. Electronically Signed   By: Suzy Bouchard M.D.   On: 11/27/2016 16:58   Ct Angio Chest Pe W/cm &/or Wo Cm  Result Date: 11/27/2016 CLINICAL DATA:  Chest pain, shortness of breath  and dizziness EXAM: CT ANGIOGRAPHY CHEST WITH CONTRAST TECHNIQUE: Multidetector CT imaging of the chest was performed using the standard protocol during bolus administration of intravenous contrast. Multiplanar CT image reconstructions and MIPs were obtained to evaluate the vascular anatomy. CONTRAST:  100 cc of Isovue 370 COMPARISON:  07/20/2016 FINDINGS: Cardiovascular: The heart size is mildly enlarged. No pericardial effusion. Linear filling defect within the right lower lobe pulmonary artery is identified compatible with a Weber secondary to chronic pulmonary embolus no focal pulmonary artery filling defects characteristics of acute embolus identified. Mediastinum/Nodes: Mediastinal and bilateral hilar adenopathy identified compatible with history of lymphoma. Index right hilar lymph node measures 1.6 cm, image 117 of series 6. Unchanged from previous exam. Index right paratracheal lymph node measures 1.6 cm, image 102 of series 6. Also unchanged from previous exam. Left hilar lymph node measures 1.6 cm, image 112 of series 6. Previously 1.1 cm. Lungs/Pleura: No pleural fluid identified. No atelectasis or airspace consolidation. Miliary pattern of pulmonary nodularity within both lungs is again noted, right greater than left. There also multiple small nodules abutting the pleural surface bilaterally. Upper Abdomen: Multiple liver cysts are again identified. No acute abnormality noted within the upper abdomen. Musculoskeletal: Degenerative disc disease is identified within the thoracic spine. No aggressive lytic or sclerotic bone lesions identified. Review of the MIP images confirms the above findings. IMPRESSION: 1. No evidence for acute pulmonary embolus. Linear filling defect within the right lower lobe pulmonary artery compatible with chronic pulmonary embolus. 2. Similar appearance of mediastinal and hilar adenopathy compatible with known lymphoma. 3. Again noted is a miliary pattern pulmonary nodularity,  favored to represent sequelae of an atypical infectious or inflammatory process. 4. Liver cysts. Electronically Signed   By: Kerby Moors M.D.   On: 11/27/2016 18:34    Chart has been reviewed    Assessment/Plan  56 y.o. male with medical history significant of mitral valve prolapse and severe mitral regurgitation sp Angioplasty, stage II large B cell non-Hodgkin's lymphoma diagnosed in 2012 treated with systemic chemotherapy, recurrent pulmonary embolism on long-term anticoagulation, and Mobitz type I second-degree AV block with frequent non-conducted PAC's  admitted for dehydration supratherapeutic INR  Present on Admission: . Chronic pulmonary embolism ([place]) - continue to monitor patient endorses lightheadedness CTA did not show any new PE patient on long-term anticoagulation . Anxiety disorder Ativan when necessary . Cigarette smoker spoke about importance of quitting . Neuropathy due to chemotherapeutic drug ([place]) . Elevated INR -spoke to cardiology and no evidence of bleeding. We will allow to trend down in case patient develops life-threatening bleeding complication anticoagulation can be reversed . Alcohol abuse spoke about importance of quitting patient currently [place] is unclear if this is an anxiety related resident trough will order CIWA protocol . Dehydration secondary to nausea and vomiting though rehydrate and provide supportive care  Mitral valve repair - patient and her daughter endorsing lightheadedness most likely secondary to dehydration cardiac etiology. Less likely with given recent procedure well check echo gram. We'll need to consult CT surgery to see if okay to remove sutures prior to discharge  Other plan as per orders.  DVT prophylaxis:  SCD   Code Status:  FULL CODE  as per patient    Family Communication:   Family not  at  Bedside    Disposition Plan:    To home once workup is complete and patient is stable                             Consults  called: discussed with cardiology     Admission status:  obs   Level of care     tele       I have spent a total of 56 min on this admission  Eldrige Pitkin 11/27/2016, 9:20 PM    [facility]  Pager [phone]   after 2 AM please page floor coverage PA If 7AM-7PM, please contact the [facility] team taking care of the patient  [email or web address]  Password [email or web address]

## 2016-11-27 NOTE — Progress Notes (Signed)
Pt arrived to unit from ED.  Telemetry applied and CCMD notified x 2.  Pt oriented to room including call light and telephone.  Discussed Pt current plan of care.  Pt indicates understanding.  Pt with no s/s of distress.  Will cont to monitor.

## 2016-11-27 NOTE — ED Triage Notes (Signed)
Received pt from home with c/o drank 1 1/2 liters of wine yesterday. Today pt is vomiting and having anxiety. Pt with tremors in triage. Pt had similar episode couple weeks ago and had open heart surgery 1 month ago.

## 2016-11-27 NOTE — ED Provider Notes (Signed)
Leonard Bentley   CSN: [id number] Arrival date & time: 11/27/16  1104     History   Chief Complaint Chief Complaint  Patient presents with  . Delirium Tremens (DTS)  . Anxiety  . Emesis    HPI Leonard Bentley is a 56 y.o. male with history of pulmonary embolism, recent mitral valve repair, alcohol abuse who presents with tremors, emesis, anxiety, headache, and lightheadedness with ambulation. Patient reports he has felt this way in the past from his anxiety. Patient used to take Xanax and Ativan as needed for his anxiety, however has been out of his prescriptions for a few months. Patient reports he feels like he's going to pass out when he walks around. Patient reports 2 episodes of large amounts of emesis this morning. Patient also reports continued right-sided chest pain, which she has had since his mitral valve repair over his incision. Patient denies any shortness of breath, abdominal pain, urinary symptoms, blood in stools, or hemoptysis.  HPI  Past Medical History:  Diagnosis Date  . Acute pulmonary embolism ([place]) 04/11/2016  . Anxiety   . Arthritis   . Atrial tachycardia ([place]) 01/06/2016  . Cancer ([place]) 07/2011   STAGE II NON- HODGKIN LYMPHOMA  . Depression   . Dyspnea   . ED (erectile dysfunction)   . Lymphoma, non Hodgkin's 09/25/2011  . Mitral regurgitation 01/20/2016  . Mobitz I 10/09/2015  . Murmur 01/06/2016  . Occasional tremors   . PAC (premature atrial contraction) 10/09/2015  . Pulmonary emboli ([place])   . S/P minimally-invasive mitral valve repair 10/27/2016   Complex valvuloplasty including artificial Gore-tex neochord placement x6 and 34 mm Edwards Physio II ring annuloplasty via right mini thoracotomy approach    Patient Active Problem List   Diagnosis Date Noted  . SVT (supraventricular tachycardia) ([place])   . S/P minimally-invasive mitral valve repair 10/27/2016  . Acute pulmonary embolism ([place]) 04/11/2016  . Left sided numbness   .  Anxiety disorder 04/02/2016  . Panic attacks 04/02/2016  . Mitral valve prolapse   . Mitral regurgitation 01/20/2016  . Atrial tachycardia ([place]) 01/06/2016  . Murmur 01/06/2016  . Mobitz I 10/09/2015  . PAC (premature atrial contraction) 10/09/2015  . Arthritis 08/14/2015  . Neuropathy due to chemotherapeutic drug ([place]) 03/09/2013  . Leucopenia 01/31/2013  . Personal history of pulmonary embolism 12/28/2012  . Cigarette smoker 08/24/2012  . Hemorrhoid 03/08/2012  . Anemia 12/10/2011  . History of non-Hodgkin's lymphoma 09/25/2011    Past Surgical History:  Procedure Laterality Date  . CARDIAC CATHETERIZATION N/A 08/13/2016   Procedure: Right/Left Heart Cath and Coronary Angiography;  Surgeon: [name], MD;  Location: [facility];  Service: Cardiovascular;  Laterality: N/A;  . MITRAL VALVE REPAIR Right 10/27/2016   Procedure: MINIMALLY INVASIVE MITRAL VALVE REPAIR (MVR) USING 34 PHYSIO II ANNULOPLASTY RING;  Surgeon: [name], MD;  Location: [facility];  Service: Open Heart Surgery;  Laterality: Right;  . NO PAST SURGERIES    . TEE WITHOUT CARDIOVERSION N/A 02/11/2016   Procedure: TRANSESOPHAGEAL ECHOCARDIOGRAM (TEE);  Surgeon: [name], MD;  Location: [facility];  Service: Cardiovascular;  Laterality: N/A;  . TEE WITHOUT CARDIOVERSION N/A 10/27/2016   Procedure: TRANSESOPHAGEAL ECHOCARDIOGRAM (TEE);  Surgeon: [name], MD;  Location: [facility];  Service: Open Heart Surgery;  Laterality: N/A;  . TRIGGER FINGER RELEASE Bilateral        Home Medications    Prior to Admission medications   Medication Sig Start  Date End Date Taking? Authorizing Provider  Chlorphen-Pseudoephed-APAP (CORICIDIN D PO) Take 1 tablet by mouth daily as needed (cold).   Yes Historical Provider, MD  fluticasone (FLONASE) 50 MCG/ACT nasal spray Place 1 spray into both nostrils 2 (two) times daily as needed (for nasal symptoms.).   Yes Historical Provider, MD  metoprolol  succinate (TOPROL XL) 25 MG 24 hr tablet Take 1 tablet (25 mg total) by mouth daily. 11/19/16  Yes Duffy Bruce, MD  oxyCODONE (OXY IR/ROXICODONE) 5 MG immediate release tablet Take 1-2 tablets (5-10 mg total) by mouth every 4 (four) hours as needed for severe pain. 11/10/16  Yes Ivin Poot, MD  warfarin (COUMADIN) 5 MG tablet Take 1 tablet (5 mg total) by mouth daily at 6 PM. 10/31/16  Yes Erin R Barrett, PA-C  aspirin EC 81 MG EC tablet Take 1 tablet (81 mg total) by mouth daily. Patient not taking: Reported on 11/19/2016 11/01/16   Erin R Barrett, PA-C  dextromethorphan 15 MG/5ML syrup Take 10 mLs (30 mg total) by mouth 4 (four) times daily as needed for cough. Patient not taking: Reported on 11/27/2016 11/19/16   Cheryln Manly, NP  enoxaparin (LOVENOX) 40 MG/0.4ML injection Inject 0.4 mLs (40 mg total) into the skin daily. Patient not taking: Reported on 11/19/2016 10/31/16   Erin R Barrett, PA-C  folic acid-pyridoxine-cyancobalamin (FOLTX) 2.5-25-2 MG TABS tablet Take 1 tablet by mouth daily. Patient not taking: Reported on 11/19/2016 11/01/16   Erin R Barrett, PA-C  guaiFENesin (MUCINEX) 600 MG 12 hr tablet Take 1 tablet (600 mg total) by mouth 2 (two) times daily. Patient not taking: Reported on 11/19/2016 11/12/16   [name], MD  iron polysaccharides (NIFEREX) 150 MG capsule Take 1 capsule (150 mg total) by mouth daily. Patient not taking: Reported on 11/19/2016 11/01/16   Lodema Hong Barrett, PA-C    Family History Family History  Problem Relation Age of Onset  . Cancer Mother     BREAST(BONE)  . Cancer Father     PANCREATIC  . Hypertension Maternal Grandmother   . Stroke Maternal Aunt   . Heart attack Neg Hx     Social History Social History  Substance Use Topics  . Smoking status: Former Smoker    Packs/Unrein: 1.00    Years: 32.00    Types: Cigarettes, E-cigarettes    Quit date: 07/06/2013  . Smokeless tobacco: Current User     Comment: smokes vapor cig. 3 times [place]  .  Alcohol use 0.0 oz/week     Comment:  1 bottle of wine     Allergies   Patient has no known allergies.   Review of Systems Review of Systems  Constitutional: Negative for chills and fever.  HENT: Negative for facial swelling and sore throat.   Respiratory: Negative for shortness of breath.   Cardiovascular: Positive for chest pain.  Gastrointestinal: Positive for nausea and vomiting. Negative for abdominal pain.  Genitourinary: Negative for dysuria.  Musculoskeletal: Negative for back pain.  Skin: Negative for rash and wound.  Neurological: Positive for tremors and light-headedness. Negative for headaches.  Psychiatric/Behavioral: The patient is not nervous/anxious.      Physical Exam Updated Vital Signs BP 110/84 (BP Location: Right Arm)   Pulse 97   Temp 97.6 F (36.4 C) (Oral)   Resp 12   Ht 5\' 9"  (1.753 m)   Wt 86.2 kg   SpO2 94%   BMI 28.06 kg/m   Physical Exam  Constitutional: He appears  well-developed and well-nourished. No distress.  HENT:  Head: Normocephalic and atraumatic.  Mouth/Throat: Oropharynx is clear and moist. No oropharyngeal exudate.  Eyes: Conjunctivae are normal. Pupils are equal, round, and reactive to light. Right eye exhibits no discharge. Left eye exhibits no discharge. No scleral icterus.  Neck: Normal range of motion. Neck supple. No thyromegaly present.  Cardiovascular: Normal rate, regular rhythm, normal heart sounds and intact distal pulses.  Exam reveals no gallop and no friction rub.   No murmur heard. Pulmonary/Chest: Effort normal and breath sounds normal. No stridor. No respiratory distress. He has no wheezes. He has no rales. He exhibits tenderness (R sided over incision).  Abdominal: Soft. Bowel sounds are normal. He exhibits no distension. There is no tenderness. There is no rebound and no guarding.  Musculoskeletal: He exhibits no edema.  Lymphadenopathy:    He has no cervical adenopathy.  Neurological: He is alert.  Coordination normal.  CN 3-12 intact; normal sensation throughout; 5/5 strength in all 4 extremities; equal bilateral grip strength; ataxia with finger-to-nose due to patient's tremors  Skin: Skin is warm and dry. No rash noted. He is not diaphoretic. No pallor.  Psychiatric: He has a normal mood and affect.  Nursing Bentley and vitals reviewed.    ED Treatments / Results  Labs (all labs ordered are listed, but only abnormal results are displayed) Labs Reviewed  COMPREHENSIVE METABOLIC PANEL - Abnormal; Notable for the following:       Result Value   CO2 21 (*)    Glucose, Bld 133 (*)    BUN 5 (*)    All other components within normal limits  ETHANOL - Abnormal; Notable for the following:    Alcohol, Ethyl (B) 6 (*)    All other components within normal limits  CBC - Abnormal; Notable for the following:    Hemoglobin 12.1 (*)    HCT 38.9 (*)    MCH 25.3 (*)    RDW 16.4 (*)    All other components within normal limits  RAPID URINE DRUG SCREEN, HOSP PERFORMED  PROTIME-INR  I-STAT TROPOININ, ED    EKG  EKG Interpretation None       Radiology No results found.  Procedures Procedures (including critical care time)  Medications Ordered in ED Medications  sodium chloride 0.9 % 1,000 mL with thiamine 123XX123 mg, folic acid 1 mg, multivitamins adult 10 mL infusion (not administered)  chlordiazePOXIDE (LIBRIUM) capsule 25 mg (not administered)     Initial Impression / Assessment and Plan / ED Course  I have reviewed the triage vital signs and the nursing notes.  Pertinent labs & imaging results that were available during my care of the patient were reviewed by me and considered in my medical decision making (see chart for details).     Patient with Coumadin toxicity, INR 10.32. CBC shows hemoglobin 12.1. CMP shows CO2 21, glucose 133, BUN 5. Troponin negative. UDS shows positive opiates. Ethanol 6. Patient with orthostatic hypotension and tachycardia. CT head negative. CT  angio shows no acute pulmonary embolus, however linear filling defect within the right lower lobe pulmonary artery compatible with chronic PE. Patient given 1 L of normal saline, banana bag, PO Librium, 1 mg Ativan in the ED. Patient's tremulousness improved, however patient continues to be lightheaded and orthostatic. I discussed case with hospitalist, Dr. Roel Cluck, who will admit the patient for further evaluation and treatment.  Final Clinical Impressions(s) / ED Diagnoses   Final diagnoses:  None    New  Prescriptions New Prescriptions   No medications on file     Caryl Ada 11/27/16 2006    Quintella Reichert, MD 11/29/16 1227

## 2016-11-27 NOTE — ED Notes (Signed)
EKG given to Dr. Knapp. 

## 2016-11-27 NOTE — ED Notes (Signed)
Pt to CT

## 2016-11-27 NOTE — ED Notes (Signed)
Patient transported to CT 

## 2016-11-28 ENCOUNTER — Observation Stay (HOSPITAL_BASED_OUTPATIENT_CLINIC_OR_DEPARTMENT_OTHER): Payer: No Typology Code available for payment source

## 2016-11-28 DIAGNOSIS — F411 Generalized anxiety disorder: Secondary | ICD-10-CM | POA: Diagnosis not present

## 2016-11-28 DIAGNOSIS — I2782 Chronic pulmonary embolism: Secondary | ICD-10-CM | POA: Diagnosis not present

## 2016-11-28 DIAGNOSIS — I371 Nonrheumatic pulmonary valve insufficiency: Secondary | ICD-10-CM

## 2016-11-28 DIAGNOSIS — F1010 Alcohol abuse, uncomplicated: Secondary | ICD-10-CM | POA: Diagnosis not present

## 2016-11-28 DIAGNOSIS — F17210 Nicotine dependence, cigarettes, uncomplicated: Secondary | ICD-10-CM | POA: Diagnosis not present

## 2016-11-28 LAB — COMPREHENSIVE METABOLIC PANEL
ALT: 16 U/L — ABNORMAL LOW (ref 17–63)
AST: 26 U/L (ref 15–41)
Albumin: 3 g/dL — ABNORMAL LOW (ref 3.5–5.0)
Alkaline Phosphatase: 49 U/L (ref 38–126)
Anion gap: 12 (ref 5–15)
BUN: 6 mg/dL (ref 6–20)
CO2: 25 mmol/L (ref 22–32)
Calcium: 8.5 mg/dL — ABNORMAL LOW (ref 8.9–10.3)
Chloride: 102 mmol/L (ref 101–111)
Creatinine, Ser: 0.85 mg/dL (ref 0.61–1.24)
GFR calc Af Amer: >60 mL/min (ref >60)
GFR calc non Af Amer: >60 mL/min (ref >60)
Glucose, Bld: 115 mg/dL — ABNORMAL HIGH (ref 65–99)
Potassium: 3.8 mmol/L (ref 3.5–5.1)
Sodium: 139 mmol/L (ref 135–145)
Total Bilirubin: 0.4 mg/dL (ref 0.3–1.2)
Total Protein: 5.7 g/dL — ABNORMAL LOW (ref 6.5–8.1)

## 2016-11-28 LAB — ECHOCARDIOGRAM COMPLETE
E decel time: 132 msec
FS: 26 % — AB (ref 28–44)
Height: 69 in
IVS/LV PW RATIO, ED: 1.07
LA ID, A-P, ES: 44 mm
LA diam end sys: 44 mm
LA diam index: 2.22 cm/m2
LA vol A4C: 73.7 ml
LA vol index: 34.8 mL/m2
LA vol: 68.9 mL
LV PW d: 12.2 mm — AB (ref 0.6–1.1)
LV dias vol index: 41 mL/m2
LV dias vol: 80 mL (ref 62–150)
LV sys vol index: 23 mL/m2
LV sys vol: 45 mL (ref 21–61)
LVOT area: 4.91 cm2
LVOT diameter: 25 mm
Lateral S' vel: 5.46 cm/s
MV Dec: 132
MV Peak grad: 5 mmHg
MV pk A vel: 114 m/s
MV pk E vel: 115 m/s
RV sys press: 27 mmHg
Reg peak vel: 244 cm/s
Simpson's disk: 45
Stroke v: 36 ml
TR max vel: 244 cm/s
Weight: 2907.2 oz

## 2016-11-28 LAB — CBC
HCT: 32.8 % — ABNORMAL LOW (ref 39.0–52.0)
Hemoglobin: 10.2 g/dL — ABNORMAL LOW (ref 13.0–17.0)
MCH: 25.2 pg — ABNORMAL LOW (ref 26.0–34.0)
MCHC: 31.1 g/dL (ref 30.0–36.0)
MCV: 81 fL (ref 78.0–100.0)
Platelets: 189 10*3/uL (ref 150–400)
RBC: 4.05 MIL/uL — ABNORMAL LOW (ref 4.22–5.81)
RDW: 15.9 % — ABNORMAL HIGH (ref 11.5–15.5)
WBC: 4.5 10*3/uL (ref 4.0–10.5)

## 2016-11-28 LAB — URINALYSIS, ROUTINE W REFLEX MICROSCOPIC
Bilirubin Urine: NEGATIVE
Glucose, UA: NEGATIVE mg/dL
Hgb urine dipstick: NEGATIVE
Ketones, ur: NEGATIVE mg/dL
Leukocytes, UA: NEGATIVE
Nitrite: NEGATIVE
Protein, ur: NEGATIVE mg/dL
Specific Gravity, Urine: >1.046 — ABNORMAL HIGH (ref 1.005–1.030)
pH: 7 (ref 5.0–8.0)

## 2016-11-28 LAB — PHOSPHORUS: Phosphorus: 3.5 mg/dL (ref 2.5–4.6)

## 2016-11-28 LAB — PROTIME-INR
INR: 6.84
Prothrombin Time: 61.4 seconds — ABNORMAL HIGH (ref 11.4–15.2)

## 2016-11-28 LAB — MAGNESIUM: Magnesium: 1.8 mg/dL (ref 1.7–2.4)

## 2016-11-28 LAB — TSH: TSH: 0.586 u[IU]/mL (ref 0.350–4.500)

## 2016-11-28 MED ORDER — WARFARIN SODIUM 5 MG PO TABS: 5.0000 mg | ORAL_TABLET | Freq: Every day | ORAL | 0 refills | Status: DC

## 2016-11-28 MED ORDER — METOPROLOL SUCCINATE ER 25 MG PO TB24
25.0000 mg | ORAL_TABLET | Freq: Every day | ORAL | Status: DC
  Administered 2016-11-28: 25 mg via ORAL
  Filled 2016-11-28: qty 1

## 2016-11-28 MED ORDER — SODIUM CHLORIDE 0.9 % IV BOLUS (SEPSIS)
500.0000 mL | Freq: Once | INTRAVENOUS | Status: AC
  Administered 2016-11-28: 500 mL via INTRAVENOUS

## 2016-11-28 NOTE — Progress Notes (Signed)
  Echocardiogram 2D Echocardiogram has been performed.  Johny Chess 11/28/2016, 8:53 AM

## 2016-11-28 NOTE — Progress Notes (Addendum)
Patient heart rate up to 150-170 on monitor  ST on monitor, patient had recently been in rest room and was standing up in room, Patient sitting in bed now resting and heart rate back down to 108 will continue to monitor patient. Isaiha Asare, Bettina Gavia RN  Dr. Candiss Norse made aware of this through [facility] paging system. Zion Ta, Bettina Gavia RN

## 2016-11-28 NOTE — Discharge Instructions (Signed)
Follow with Primary MD Wyatt Haste, MD in 2 days   Get CBC, CMP, INR checked  by Primary MD  in 2 days ( we routinely change or add medications that can affect your baseline labs and fluid status, therefore we recommend that you get the mentioned basic workup next visit with your PCP, your PCP may decide not to get them or add new tests based on their clinical decision)   Activity: As tolerated with Full fall precautions use walker/cane & assistance as needed   Disposition Home     Diet:   Heart Healthy   For Heart failure patients - Check your Weight same time everyday, if you gain over 2 pounds, or you develop in leg swelling, experience more shortness of breath or chest pain, call your Primary MD immediately. Follow Cardiac Low Salt Diet and 1.5 lit/[place] fluid restriction.   On your next visit with your primary care physician please Get Medicines reviewed and adjusted.   Please request your Prim.MD to go over all Hospital Tests and Procedure/Radiological results at the follow up, please get all Hospital records sent to your Prim MD by signing hospital release before you go home.   If you experience worsening of your admission symptoms, develop shortness of breath, life threatening emergency, suicidal or homicidal thoughts you must seek medical attention immediately by calling 911 or calling your MD immediately  if symptoms less severe.  You Must read complete instructions/literature along with all the possible adverse reactions/side effects for all the Medicines you take and that have been prescribed to you. Take any new Medicines after you have completely understood and accpet all the possible adverse reactions/side effects.   Do not drive, operate heavy machinery, perform activities at heights, swimming or participation in water activities or provide baby sitting services if your were admitted for syncope or siezures until you have seen by Primary MD or a Neurologist and  advised to do so again.  Do not drive when taking Pain medications.    Do not take more than prescribed Pain, Sleep and Anxiety Medications  Special Instructions: If you have smoked or chewed Tobacco  in the last 2 yrs please stop smoking, stop any regular Alcohol  and or any Recreational drug use.  Wear Seat belts while driving.   Please note  You were cared for by a hospitalist during your hospital stay. If you have any questions about your discharge medications or the care you received while you were in the hospital after you are discharged, you can call the unit and asked to speak with the hospitalist on call if the hospitalist that took care of you is not available. Once you are discharged, your primary care physician will handle any further medical issues. Please note that NO REFILLS for any discharge medications will be authorized once you are discharged, as it is imperative that you return to your primary care physician (or establish a relationship with a primary care physician if you do not have one) for your aftercare needs so that they can reassess your need for medications and monitor your lab values.

## 2016-11-28 NOTE — Progress Notes (Signed)
Call received from lab with critical INR 6.84.  Value much improved from admission, attending aware of increased values.  No s/s of bleeding.  Will continue to closely monitor.

## 2016-11-28 NOTE — Discharge Summary (Signed)
Leonard Bentley [id number] DOB: 1961/08/03 DOA: 11/27/2016  PCP: Leonard Haste, MD  Admit date: 11/27/2016  Discharge date: 11/28/2016  Admitted From: Home   Disposition:  Home   Recommendations for Outpatient Follow-up:   Follow up with PCP in 1-2 weeks  PCP Please obtain BMP/CBC, 2 view CXR in 1week,  (see Discharge instructions)   PCP Please follow up on the following pending results: Monitor echo report and check INR in 1-2 days   Home Health: None  Equipment/Devices: None  Consultations: None Discharge Condition: Stable CODE STATUS: Full  Diet Recommendation:  Heart Healthy    Chief Complaint  Patient presents with  . Delirium Tremens (DTS)  . Anxiety  . Emesis     Brief history of present illness from the [place] of admission and additional interim summary    Leonard Bentley is a 56 y.o. male with medical history significant of mitral valve prolapse and severe mitral regurgitation sp Angioplasty, stage II large B cell non-Hodgkin's lymphoma diagnosed in 2012 treated with systemic chemotherapy, recurrent pulmonary embolism on long-term anticoagulation, and Mobitz type I second-degree AV block with frequent non-conducted PAC's, ongoing alcohol abuse, was admitted for dehydration causing light headed sensation.    Hospital issues addressed     1. Lightheadedness. Caused by dehydration and mild orthostatic hypotension, hydrated with IV fluids TED stockings applied, orthostatics are stable and he is symptom free. Will be discharged home.  2. Recent history of mitral valve repair. Echo as belowShowed EF of 45% with some global hypokinesis, this was discussed with cardiologist Dr. Luther Bentley, this is expected change post recent mitral valve repair, I have requested the patient to follow with his primary  cardiologist and to follow with his cardiothoracic surgeon within the next few days for postop follow-up. No acute issues.  3. History of recurrent PE. Supratherapeutic INR this admission. No bleeding. As to hold Coumadin one more Butzin and follow with Coumadin clinic tomorrow. INR now is close to 6.7 down from 10.  4. Alcohol abuse, anxiety, depression, smoking. Counseled to quit all. Stable continue home regimen and follow with PCP. No signs of DTs.   Discharge diagnosis     Active Problems:   History of non-Hodgkin's lymphoma   Alcohol abuse   Cigarette smoker   Neuropathy due to chemotherapeutic drug ([place])   Anxiety disorder   Acute pulmonary embolism (HCC)   Elevated INR   Dehydration    Discharge instructions    Discharge Instructions    Diet - low sodium heart healthy    Complete by:  As directed    Discharge instructions    Complete by:  As directed    Follow with Primary MD Leonard Haste, MD in 2 days   Get CBC, CMP, INR checked  by Primary MD  in 2 days ( we routinely change or add medications that can affect your baseline labs and fluid status, therefore we recommend that you get the mentioned basic workup next visit with your PCP, your PCP may decide not  to get them or add new tests based on their clinical decision)   Activity: As tolerated with Full fall precautions use walker/cane & assistance as needed   Disposition Home     Diet:   Heart Healthy   For Heart failure patients - Check your Weight same time everyday, if you gain over 2 pounds, or you develop in leg swelling, experience more shortness of breath or chest pain, call your Primary MD immediately. Follow Cardiac Low Salt Diet and 1.5 lit/Stead fluid restriction.   On your next visit with your primary care physician please Get Medicines reviewed and adjusted.   Please request your Prim.MD to go over all Hospital Tests and Procedure/Radiological results at the follow up, please get all Hospital  records sent to your Prim MD by signing hospital release before you go home.   If you experience worsening of your admission symptoms, develop shortness of breath, life threatening emergency, suicidal or homicidal thoughts you must seek medical attention immediately by calling 911 or calling your MD immediately  if symptoms less severe.  You Must read complete instructions/literature along with all the possible adverse reactions/side effects for all the Medicines you take and that have been prescribed to you. Take any new Medicines after you have completely understood and accpet all the possible adverse reactions/side effects.   Do not drive, operate heavy machinery, perform activities at heights, swimming or participation in water activities or provide baby sitting services if your were admitted for syncope or siezures until you have seen by Primary MD or a Neurologist and advised to do so again.  Do not drive when taking Pain medications.    Do not take more than prescribed Pain, Sleep and Anxiety Medications  Special Instructions: If you have smoked or chewed Tobacco  in the last 2 yrs please stop smoking, stop any regular Alcohol  and or any Recreational drug use.  Wear Seat belts while driving.   Please note  You were cared for by a hospitalist during your hospital stay. If you have any questions about your discharge medications or the care you received while you were in the hospital after you are discharged, you can call the unit and asked to speak with the hospitalist on call if the hospitalist that took care of you is not available. Once you are discharged, your primary care physician will handle any further medical issues. Please note that NO REFILLS for any discharge medications will be authorized once you are discharged, as it is imperative that you return to your primary care physician (or establish a relationship with a primary care physician if you do not have one) for your  aftercare needs so that they can reassess your need for medications and monitor your lab values.   Increase activity slowly    Complete by:  As directed       Discharge Medications   Allergies as of 11/28/2016   No Known Allergies     Medication List    STOP taking these medications   dextromethorphan 15 MG/5ML syrup   enoxaparin 40 MG/0.4ML injection Commonly known as:  LOVENOX   guaiFENesin 600 MG 12 hr tablet Commonly known as:  MUCINEX     TAKE these medications   aspirin 81 MG EC tablet Take 1 tablet (81 mg total) by mouth daily.   CORICIDIN D PO Take 1 tablet by mouth daily as needed (cold).   fluticasone 50 MCG/ACT nasal spray Commonly known as:  FLONASE Place 1 spray  into both nostrils 2 (two) times daily as needed (for nasal symptoms.).   folic acid-pyridoxine-cyancobalamin 2.5-25-2 MG Tabs tablet Commonly known as:  FOLTX Take 1 tablet by mouth daily.   iron polysaccharides 150 MG capsule Commonly known as:  NIFEREX Take 1 capsule (150 mg total) by mouth daily.   metoprolol succinate 25 MG 24 hr tablet Commonly known as:  TOPROL XL Take 1 tablet (25 mg total) by mouth daily.   oxyCODONE 5 MG immediate release tablet Commonly known as:  Oxy IR/ROXICODONE Take 1-2 tablets (5-10 mg total) by mouth every 4 (four) hours as needed for severe pain.   warfarin 5 MG tablet Commonly known as:  COUMADIN Take 1 tablet (5 mg total) by mouth daily at 6 PM. Get your INR checked on 11/29/2016 at the Coumadin clinic and then restart Coumadin as instructed by the Coumadin clinic Start taking on:  11/29/2016 What changed:  additional instructions       Follow-up Information    Leonard Haste, MD. Schedule an appointment as soon as possible for a visit in 2 [place](s).   Specialty:  Family Medicine Contact information: [place] [address] [phone]        [name], MD. Schedule an appointment as soon as possible for a  visit in 1 [place](s).   Specialty:  Cardiology Why:  INR check and follow on Echo results Contact information: [address] [place] [address] [phone]        Clarence H Owen, MD. Schedule an appointment as soon as possible for a visit in 2 [place](s).   Specialty:  Cardiothoracic Surgery Why:  post op follow up Contact information: [place] [address] [place] [address] [phone]           Major procedures and Radiology Reports - PLEASE review detailed and final reports thoroughly  -       TTE  - - Left ventricle: The cavity size was normal. There was mild concentric hypertrophy. Systolic function was mildly reduced. The estimated ejection fraction was in the range of 45% to 50%. Diffuse hypokinesis. The study is not technically sufficient to allow evaluation of LV diastolic function. - Aorta: Aortic root dimension: 38 mm (ED). - Ascending aorta: The ascending aorta was mildly dilated. - Left atrium: The atrium was mildly dilated.   Dg Chest 2 View  Result Date: 10/31/2016 CLINICAL DATA:  Chest tube removal yesterday.  Mitral valve repair. EXAM: CHEST  2 VIEW COMPARISON:  10/29/2016 FINDINGS: The right chest tubes have been removed. Negative for a pneumothorax. Surgical changes compatible with mitral valve repair. Mild blunting at the costophrenic angles could represent tiny effusions. Slightly prominent interstitial markings in the right lung could represent area of atelectasis or mild asymmetric edema. Heart size is within normal limits and stable. The trachea is midline. IMPRESSION: Negative for pneumothorax. Question tiny pleural effusions. Asymmetric interstitial densities in the right lung as described. Electronically Signed   By: Markus Daft M.D.   On: 10/31/2016 11:22      Micro Results     No results found for this or any previous visit (from the past 240 hour(s)).  Today   Subjective    Kaveh Rahal today has no headache,no chest  abdominal pain,no new weakness tingling or numbness, feels much better wants to go home today.     Objective   Blood pressure 113/79, pulse (!) 120, temperature 98.1 F (36.7 C), temperature source Oral, resp. rate  18, height 5\' 9"  (1.753 m), weight 82.4 kg (181 lb 11.2 oz), SpO2 100 %.   Intake/Output Summary (Last 24 hours) at 11/28/16 0956 Last data filed at 11/27/16 1654  Gross per 24 hour  Intake             1000 ml  Output                0 ml  Net             1000 ml    Exam Awake Alert, Oriented x 3, No new F.N deficits, Normal affect [place].AT,PERRAL Supple Neck,No JVD, No cervical lymphadenopathy appriciated.  Symmetrical Chest wall movement, Good air movement bilaterally, CTAB RRR,No Gallops,Rubs, +ve mitral valve Murmur, No Parasternal Heave +ve B.Sounds, Abd Soft, Non tender, No organomegaly appriciated, No rebound -guarding or rigidity. No Cyanosis, Clubbing or edema, No new Rash or bruise   Data Review   CBC w Diff: Lab Results  Component Value Date   WBC 4.5 11/28/2016   HGB 10.2 (L) 11/28/2016   HGB 12.0 (L) 08/11/2016   HCT 32.8 (L) 11/28/2016   HCT 36.7 (L) 08/11/2016   PLT 189 11/28/2016   PLT 162 08/11/2016   PLT 158 04/22/2016   LYMPHOPCT 25.2 08/11/2016   MONOPCT 18.8 (H) 08/11/2016   EOSPCT 2.3 08/11/2016   BASOPCT 1.0 08/11/2016    CMP: Lab Results  Component Value Date   NA 139 11/28/2016   NA 141 08/11/2016   K 3.8 11/28/2016   K 4.7 08/11/2016   CL 102 11/28/2016   CL 98 12/28/2012   CO2 25 11/28/2016   CO2 28 08/11/2016   BUN 6 11/28/2016   BUN 13.7 08/11/2016   CREATININE 0.85 11/28/2016   CREATININE 1.0 08/11/2016   PROT 5.7 (L) 11/28/2016   PROT 7.8 08/11/2016   ALBUMIN 3.0 (L) 11/28/2016   ALBUMIN 4.3 08/11/2016   BILITOT 0.4 11/28/2016   BILITOT 1.09 08/11/2016   ALKPHOS 49 11/28/2016   ALKPHOS 46 08/11/2016   AST 26 11/28/2016   AST 72 (H) 08/11/2016   ALT 16 (L) 11/28/2016   ALT 38 08/11/2016  .   Total Time in  preparing paper work, data evaluation and todays exam - 35 minutes  [name] M.D on 11/28/2016 at 9:56 AM  [facility]   Office  [phone]

## 2016-11-28 NOTE — Progress Notes (Signed)
Patient given discharge instructions, medication list, and follow up appointments. Along with AVS given to patient. IV and tele dcd and all questions answered will discharge home as ordered. Alli Jasmer, Bettina Gavia rN

## 2016-11-29 ENCOUNTER — Encounter: Payer: Self-pay | Admitting: *Deleted

## 2016-11-29 ENCOUNTER — Ambulatory Visit: Payer: Self-pay

## 2016-11-29 ENCOUNTER — Telehealth: Payer: Self-pay | Admitting: Family Medicine

## 2016-11-29 NOTE — Progress Notes (Unsigned)
This encounter was created in error - please disregard.

## 2016-11-29 NOTE — Telephone Encounter (Signed)
Left message for pt to call needs a TOC visit.

## 2016-12-16 ENCOUNTER — Encounter: Payer: Self-pay | Admitting: Family Medicine

## 2016-12-16 ENCOUNTER — Other Ambulatory Visit: Payer: Self-pay

## 2016-12-16 ENCOUNTER — Ambulatory Visit (INDEPENDENT_AMBULATORY_CARE_PROVIDER_SITE_OTHER): Payer: PRIVATE HEALTH INSURANCE | Admitting: Family Medicine

## 2016-12-16 VITALS — BP 120/82 | HR 142 | Wt 183.0 lb

## 2016-12-16 DIAGNOSIS — K2100 Gastro-esophageal reflux disease with esophagitis, without bleeding: Secondary | ICD-10-CM

## 2016-12-16 DIAGNOSIS — F418 Other specified anxiety disorders: Secondary | ICD-10-CM

## 2016-12-16 DIAGNOSIS — Z4802 Encounter for removal of sutures: Secondary | ICD-10-CM

## 2016-12-16 DIAGNOSIS — I471 Supraventricular tachycardia: Secondary | ICD-10-CM

## 2016-12-16 DIAGNOSIS — R066 Hiccough: Secondary | ICD-10-CM | POA: Diagnosis not present

## 2016-12-16 MED ORDER — OMEPRAZOLE 40 MG PO CPDR: 40.0000 mg | DELAYED_RELEASE_CAPSULE | Freq: Every day | ORAL | 1 refills | Status: DC

## 2016-12-16 MED ORDER — ALPRAZOLAM 0.25 MG PO TABS: 0.2500 mg | ORAL_TABLET | Freq: Two times a day (BID) | ORAL | 0 refills | Status: DC | PRN

## 2016-12-16 NOTE — Telephone Encounter (Signed)
Called in xanax per jcl 

## 2016-12-16 NOTE — Patient Instructions (Addendum)
Take the Prilosec every night next week or 2 and see if that will quiet down Make an appointment with Dr. Oval Linsey to follow-up on your heart rate

## 2016-12-16 NOTE — Progress Notes (Signed)
   Subjective:    Patient ID: Leonard Bentley, male    DOB: [birth year], 56 y.o.   MRN: [id number]  HPI He states that yesterday evening he started noting difficulty with hiccups that were intermittent in nature. He did note that the hiccups occurred when he had reflux type symptoms. He states that he has had difficulty with slight cough since his surgery December 20 does occasionally get dizzy when he coughs. No fever, chills, weakness. He presently is on metoprolol for treatment of SVT. He also is on Coumadin. He states that he is having difficulty with anxiety especially DM with all the medical issues that he has had 2 into her over the last several years. He has used Xanax in the past and would like a refill on that. Also of note was 2 sutures that were still in place and surgery December 20.   Review of Systems     Objective:   Physical Exam Alert and shaking. Tachycardia is noted. Lungs are clear to auscultation.       Assessment & Plan:  Hiccoughs  Gastroesophageal reflux disease with esophagitis - Plan: omeprazole (PRILOSEC) 40 MG capsule  SVT (supraventricular tachycardia) (HCC)  Anxiety about health - Plan: ALPRAZolam (XANAX) 0.25 MG tablet  Encounter for removal of sutures He was given some Maalox which helped soothe his esophageal distress and help get rid of the hiccups. Recommend that he use Prilosec regularly for the next week or so to see if this will quiet down and stop the hiccups. Also he is to increase his metoprolol to 50 mg and follow-up with Dr. Oval Linsey concerning this.  2 sutures were removed from the right lateral chest wall. We'll follow him closely concerning Xanax as he does have an alcohol abuse history.

## 2016-12-17 ENCOUNTER — Ambulatory Visit (INDEPENDENT_AMBULATORY_CARE_PROVIDER_SITE_OTHER): Payer: No Typology Code available for payment source | Admitting: Pharmacist

## 2016-12-17 DIAGNOSIS — Z9889 Other specified postprocedural states: Secondary | ICD-10-CM | POA: Diagnosis not present

## 2016-12-17 DIAGNOSIS — Z86711 Personal history of pulmonary embolism: Secondary | ICD-10-CM

## 2016-12-17 LAB — POCT INR: INR: 1.8

## 2016-12-20 ENCOUNTER — Ambulatory Visit: Payer: No Typology Code available for payment source | Admitting: Cardiovascular Disease

## 2016-12-20 NOTE — Progress Notes (Deleted)
Cardiology Office Note   Date:  12/20/2016   ID:  Leonard Bentley, DOB December 06, 1960, MRN [id number]  PCP:  Leonard Haste, MD  Cardiologist:   Leonard Latch, MD   No chief complaint on file.     History of Present Illness:  Leonard Bentley is a 56 y.o. male with severe mitral regurgitation s/p mitral valve repair, chronic systolic heart failure LVEF 45-50%, alcohol abuse, tobacco abuse, recurrent PE, and non-Hodgkin's lymphoma s/p chemotherapy who presents for follow up.  Leonard Bentley was initially evaluated 10/2015 after his PCP, Dr. Jill Bentley, noted that he had Mobitz I.  He was referred to cardiology, where he reported that he wasn't able to run as long as he used to.  He was referred for exercise Cardiolite that revealed LVEF 52% and no ischemia. On 01/20/16 he had an echo that was revealed LVEF 60-65% with moderate LVH and posterior mitral valve leaflet prolapse. There was moderate to severe mitral regurgitation.  He was referred for transesophageal echocardiography which confirmed moderate to severe mitral regurgitation. The left atrium was massively dilated but there was no left ventricle dilation.  Pulmonary pressures were normal.   He was referred to Dr. [name] for consideration of mitral valve replacement/repair.  He had a chest CT on 04/09/16 for preoperative assessment and was noted to have a pulmonary embolism in the right lower lobe.  Leonard Bentley had a PE several years ago in the setting of testosterone therapy.  He was started on Xarelto anticoagulation indefinitely.  He had a follow up CT 07/20/16 that showed no acute thrombus and resolution of most of the prior PE.  There was a minimal, chronic linear thrombus in the RLL.  On 10/27/16 he underwent minimally invasive mitral valve repair with a 34 mm Edwards ring.  Echo 11/28/16 revealed LVEF 45-50% with diffuse hypokinesis and a mildly dilated ascending aorta.  Since the surgery Leonard Bentley was seen in the ED 11/19/16 with an episode of SVT.  He  was noted ot have sinus tachycardia vs atrial tachycardia with rates in the 130s that improved with lorazepam.  There was no evidence of heart failure and metoprolol was increased to 25 mg daily.   ?EtOH, tob  Past Medical History:  Diagnosis Date  . Acute pulmonary embolism ([place]) 04/11/2016  . Anxiety   . Arthritis   . Atrial tachycardia ([place]) 01/06/2016  . Cancer ([place]) 07/2011   STAGE II NON- HODGKIN LYMPHOMA  . Depression   . Dyspnea   . ED (erectile dysfunction)   . Lymphoma, non Hodgkin's 09/25/2011  . Mitral regurgitation 01/20/2016  . Mobitz I 10/09/2015  . Murmur 01/06/2016  . Occasional tremors   . PAC (premature atrial contraction) 10/09/2015  . Pulmonary emboli ([place])   . S/P minimally-invasive mitral valve repair 10/27/2016   Complex valvuloplasty including artificial Gore-tex neochord placement x6 and 34 mm Edwards Physio II ring annuloplasty via right mini thoracotomy approach    Past Surgical History:  Procedure Laterality Date  . CARDIAC CATHETERIZATION N/A 08/13/2016   Procedure: Right/Left Heart Cath and Coronary Angiography;  Surgeon: Leonard Booze, MD;  Location: [place] [facility];  Service: Cardiovascular;  Laterality: N/A;  . MITRAL VALVE REPAIR Right 10/27/2016   Procedure: MINIMALLY INVASIVE MITRAL VALVE REPAIR (MVR) USING 34 PHYSIO II ANNULOPLASTY RING;  Surgeon: Leonard Alberts, MD;  Location: [place];  Service: Open Heart Surgery;  Laterality: Right;  . NO PAST SURGERIES    . TEE WITHOUT  CARDIOVERSION N/A 02/11/2016   Procedure: TRANSESOPHAGEAL ECHOCARDIOGRAM (TEE);  Surgeon: Leonard Latch, MD;  Location: [place];  Service: Cardiovascular;  Laterality: N/A;  . TEE WITHOUT CARDIOVERSION N/A 10/27/2016   Procedure: TRANSESOPHAGEAL ECHOCARDIOGRAM (TEE);  Surgeon: Leonard Alberts, MD;  Location: [place];  Service: Open Heart Surgery;  Laterality: N/A;  . TRIGGER FINGER RELEASE Bilateral      Current Outpatient Prescriptions  Medication Sig Dispense  Refill  . ALPRAZolam (XANAX) 0.25 MG tablet Take 1 tablet (0.25 mg total) by mouth 2 (two) times daily as needed for anxiety. 30 tablet 0  . metoprolol succinate (TOPROL XL) 25 MG 24 hr tablet Take 1 tablet (25 mg total) by mouth daily. 30 tablet 3  . omeprazole (PRILOSEC) 40 MG capsule Take 1 capsule (40 mg total) by mouth daily. 30 capsule 1  . warfarin (COUMADIN) 5 MG tablet Take 1 tablet (5 mg total) by mouth daily at 6 PM. Get your INR checked on 11/29/2016 at the Coumadin clinic and then restart Coumadin as instructed by the Coumadin clinic 1 tablet 0   No current facility-administered medications for this visit.     Allergies:   Patient has no known allergies.    Social History:  The patient  reports that he quit smoking about 3 years ago. His smoking use included Cigarettes and E-cigarettes. He has a 32.00 pack-year smoking history. He uses smokeless tobacco. He reports that he drinks alcohol. He reports that he does not use drugs.   Family History:  The patient's family history includes Cancer in his father and mother; Hypertension in his maternal grandmother; Stroke in his maternal aunt.    ROS:  Please see the history of present illness.   Otherwise, review of systems are positive for none.   All other systems are reviewed and negative.    PHYSICAL EXAM: VS:  There were no vitals taken for this visit. , BMI There is no height or weight on file to calculate BMI. GENERAL:  Well appearing HEENT:  Pupils equal round and reactive, fundi not visualized, oral mucosa unremarkable NECK:  No jugular venous distention, waveform within normal limits, carotid upstroke brisk and symmetric, no bruits LYMPHATICS:  No cervical adenopathy LUNGS:  Clear to auscultation bilaterally HEART:  RRR.  PMI not displaced or sustained,S1 and S2 within normal limits, + S3, no S4, no clicks, no rubs, II/VI holosystolic murmur at the apex ABD:  Flat, positive bowel sounds normal in frequency in pitch, no  bruits, no rebound, no guarding, no midline pulsatile mass, no hepatomegaly, no splenomegaly EXT:  2 plus pulses throughout, no edema, no cyanosis no clubbing SKIN:  No rashes no nodules NEURO:  Cranial nerves II through XII grossly intact, motor grossly intact throughout PSYCH:  Cognitively intact, oriented to person place and time   EKG:  EKG is ordered today. 04/09/16: Sinus arrhythmia. Rate 92 bpm. First degree AV block. 09/15/15: sinus rhythm rate 87 bpm. PACs. Second degree heart block, Mobitz I.  LAFB  Exercise Cardiolite 10/15/15:  Nuclear stress EF: 52%.  There was no ST segment deviation noted during stress.  No T wave inversion was noted during stress.  The study is normal.  This is a low risk study.  Low risk stress nuclear study with normal perfusion and normal left ventricular regional systolic function. Borderline global LVEF 52%.  Echo 01/20/16: Study Conclusions  - Left ventricle: The cavity size was normal. Wall thickness was  increased in a pattern of moderate LVH. Systolic  function was  normal. The estimated ejection fraction was in the range of 60%  to 65%. Wall motion was normal; there were no regional wall  motion abnormalities. The study is not technically sufficient to  allow evaluation of LV diastolic function. - Aortic valve: Sclerosis without stenosis. There was trivial  regurgitation. - Mitral valve: Mildly thickened leaflets with predominate prolapse  of the posterior leaflet and anteriorly directed mitral  regurgitation. There was moderate to severe eccentric  regurgitation directed at the atrial septum. - Left atrium: Massively dilated at 85 ml/m2. - Tricuspid valve: There was mild regurgitation. - Pulmonary arteries: PA peak pressure: 27 mm Hg (S). - Inferior vena cava: The vessel was normal in size. The  respirophasic diameter changes were in the normal range (>= 50%),  consistent with normal central venous  pressure.  Impressions:  - LVEF 60-65%, moderate LVH, trivial AI, moderate to more likely  severe eccentric MR directed anteriorly and to the interatrial  septum, posterior mitral valve leaflet prolpase, massive LAE,  mild TR, RVSP 27 mmHg, normal IVC.  Echo 1/21/118:  - Left ventricle: The cavity size was normal. There was mild   concentric hypertrophy. Systolic function was mildly reduced. The   estimated ejection fraction was in the range of 45% to 50%.   Diffuse hypokinesis. The study is not technically sufficient to   allow evaluation of LV diastolic function. - Aorta: Aortic root dimension: 38 mm (ED). - Ascending aorta: The ascending aorta was mildly dilated. - Left atrium: The atrium was mildly dilated.  Recent Labs: 11/19/2016: B Natriuretic Peptide 71.3 11/28/2016: ALT 16; BUN 6; Creatinine, Ser 0.85; Hemoglobin 10.2; Magnesium 1.8; Platelets 189; Potassium 3.8; Sodium 139; TSH 0.586    Lipid Panel    Component Value Date/Time   CHOL 239 (H) 04/03/2016 0323   CHOL 238 (H) 08/18/2015 0825   TRIG 63 04/03/2016 0323   HDL 126 04/03/2016 0323   HDL 124 08/18/2015 0825   CHOLHDL 1.9 04/03/2016 0323   VLDL 13 04/03/2016 0323   LDLCALC 100 (H) 04/03/2016 0323   LDLCALC 94 08/18/2015 0825      Wt Readings from Last 3 Encounters:  12/16/16 83 kg (183 lb)  11/27/16 82.4 kg (181 lb 11.2 oz)  10/31/16 87 kg (191 lb 11.2 oz)      ASSESSMENT AND PLAN:  # Pulmonary Embolism: Mr. Canepa had a recurrent PE and will therefore require lifelong anticoagulation.  He has an upcoming appointment with his Oncologist, at which time secondary causes may be considered.  There was no evidence of recurrent malignancy on his chest CT.  This CT only showed minimal chronic thrombus in the RLL.  # Moderate-Severe Mitral Regurgitation: Mr. Hasman was in the process of being worked up for mitral valve repair/replacement when he was noted to have a PE.  He continues to have exertional  symptoms.  We will refer him for LHC/RHC to advance his pre-surgical planning.  He has an appointment with Dr. Roxy Manns next month.  Although he has only minimal chronic thrombus, would avoid the RLL during his RHC.  He will hold Xarelto 3 days prior to cath.  He will take  Aspirin 81mg  daily while holding Xarelto.  # PAT, PACs: Symptoms have improved since restarting metoprolol 12.5 mg prn.  He had orthostatic hypotension on bid dosing.     Current medicines are reviewed at length with the patient today.  The patient does not have concerns regarding medicines.  The following changes have  been made:  none  Labs/ tests ordered today include:   No orders of the defined types were placed in this encounter.    Disposition:   FU in 3 months.   Signed, Liannah Yarbough C. Oval Linsey, MD, [facility]  12/20/2016 5:21 AM    [place] and

## 2016-12-31 ENCOUNTER — Encounter (HOSPITAL_COMMUNITY): Payer: Self-pay | Admitting: Family Medicine

## 2016-12-31 NOTE — Progress Notes (Signed)
Mailed patient letter with information about Cardiac Rehab program. MW °

## 2017-01-07 ENCOUNTER — Telehealth: Payer: Self-pay | Admitting: Cardiovascular Disease

## 2017-01-07 ENCOUNTER — Ambulatory Visit (INDEPENDENT_AMBULATORY_CARE_PROVIDER_SITE_OTHER)
Payer: No Typology Code available for payment source | Admitting: Pharmacist Clinician (PhC)/ Clinical Pharmacy Specialist

## 2017-01-07 ENCOUNTER — Telehealth: Payer: Self-pay | Admitting: *Deleted

## 2017-01-07 ENCOUNTER — Encounter: Payer: Self-pay | Admitting: Physician Assistant

## 2017-01-07 ENCOUNTER — Ambulatory Visit (INDEPENDENT_AMBULATORY_CARE_PROVIDER_SITE_OTHER): Payer: No Typology Code available for payment source | Admitting: Physician Assistant

## 2017-01-07 VITALS — BP 98/74 | HR 141 | Ht 69.0 in | Wt 188.0 lb

## 2017-01-07 DIAGNOSIS — Z7901 Long term (current) use of anticoagulants: Secondary | ICD-10-CM | POA: Diagnosis not present

## 2017-01-07 DIAGNOSIS — Z86711 Personal history of pulmonary embolism: Secondary | ICD-10-CM

## 2017-01-07 DIAGNOSIS — Z9889 Other specified postprocedural states: Secondary | ICD-10-CM | POA: Diagnosis not present

## 2017-01-07 DIAGNOSIS — I471 Supraventricular tachycardia: Secondary | ICD-10-CM | POA: Diagnosis not present

## 2017-01-07 LAB — POCT INR: INR: 2.2

## 2017-01-07 MED ORDER — FLECAINIDE ACETATE 50 MG PO TABS
50.0000 mg | ORAL_TABLET | Freq: Two times a day (BID) | ORAL | 3 refills | Status: DC
Start: 2017-01-07 — End: 2017-06-10

## 2017-01-07 MED ORDER — METOPROLOL SUCCINATE ER 50 MG PO TB24: 50.0000 mg | ORAL_TABLET | Freq: Every day | ORAL | 5 refills | Status: DC

## 2017-01-07 NOTE — Progress Notes (Signed)
Cardiology Office Note   Date:  01/07/2017   ID:  Leonard Bentley, DOB 04-25-1961, MRN [id number]  PCP:  Wyatt Haste, MD  Cardiologist:  Dr. Oval Linsey 07/28/2016  [name], PA-C   Chief Complaint  Patient presents with  . Follow-up    had some chest pressure and chest tightness, had some shortness of breath, no edema, had some cramping in legs, occassional lightheaded and dizziness    History of Present Illness: Leonard Bentley is a 56 y.o. male with a history of prior tobacco abuse, recurrent PE>>lifelong anticoag, and non-Hodgkin's lymphoma s/p chemotherapy, atrial tach, MR, Mobitz I, MVP s/p repair 10/2016, ongoing EtOH abuse; cath 08/2016 w/ nl cors, L main was aneurysmal.   Admit 01/20-01/21/2018 for dehydration, orthostatic hypotension, echo performed with results below, patient to follow-up with cardiology and TCTS   Leonard Bentley presents for cardiology follow up  He was not completely consistent with coumadin but has not missed but one dose in the last week. No bleeding issues.   His PCP increased the Toprol XL to 50 mg qd, he has been on that dose for 3 weeks. His HR has been 80 at rest recently, was 48 at rest a long time ago.   He is exercising as he is able, today he was able to do 3/4 mile and some isometrics. He had chest discomfort with jogging today, it was tightness and a "pinch". He did not check his HR during this time. He does not have an awareness of a rapid HR,Does not routinely get lightheaded or dizzy.   He wonders if he is able to travel, he heard that he could not.   Past Medical History:  Diagnosis Date  . Acute pulmonary embolism ([place]) 04/11/2016  . Anxiety   . Arthritis   . Atrial tachycardia ([place]) 01/06/2016  . Cancer ([place]) 07/2011   STAGE II NON- HODGKIN LYMPHOMA  . Depression   . Dyspnea   . ED (erectile dysfunction)   . Lymphoma, non Hodgkin's 09/25/2011  . Mitral regurgitation 01/20/2016  . Mobitz I 10/09/2015  . Murmur 01/06/2016  .  Occasional tremors   . PAC (premature atrial contraction) 10/09/2015  . S/P minimally-invasive mitral valve repair 10/27/2016   Complex valvuloplasty including artificial Gore-tex neochord placement x6 and 34 mm Edwards Physio II ring annuloplasty via right mini thoracotomy approach    Past Surgical History:  Procedure Laterality Date  . CARDIAC CATHETERIZATION N/A 08/13/2016   Procedure: Right/Left Heart Cath and Coronary Angiography;  Surgeon: [name], MD;  Location: [place] [facility];  Service: Cardiovascular;  Laterality: N/A;  . MITRAL VALVE REPAIR Right 10/27/2016   Procedure: MINIMALLY INVASIVE MITRAL VALVE REPAIR (MVR) USING 34 PHYSIO II ANNULOPLASTY RING;  Surgeon: [name], MD;  Location: [place];  Service: Open Heart Surgery;  Laterality: Right;  . NO PAST SURGERIES    . TEE WITHOUT CARDIOVERSION N/A 02/11/2016   Procedure: TRANSESOPHAGEAL ECHOCARDIOGRAM (TEE);  Surgeon: [name], MD;  Location: [place];  Service: Cardiovascular;  Laterality: N/A;  . TEE WITHOUT CARDIOVERSION N/A 10/27/2016   Procedure: TRANSESOPHAGEAL ECHOCARDIOGRAM (TEE);  Surgeon: [name], MD;  Location: [place];  Service: Open Heart Surgery;  Laterality: N/A;  . TRIGGER FINGER RELEASE Bilateral     Current Outpatient Prescriptions  Medication Sig Dispense Refill  . ALPRAZolam (XANAX) 0.25 MG tablet Take 1 tablet (0.25 mg total) by mouth 2 (two) times daily as needed for anxiety. 30 tablet 0  .  metoprolol succinate (TOPROL XL) 25 MG 24 hr tablet Take 1 tablet (25 mg total) by mouth daily. 30 tablet 3  . omeprazole (PRILOSEC) 40 MG capsule Take 1 capsule (40 mg total) by mouth daily. 30 capsule 1  . warfarin (COUMADIN) 5 MG tablet Take 1 tablet (5 mg total) by mouth daily at 6 PM. Get your INR checked on 11/29/2016 at the Coumadin clinic and then restart Coumadin as instructed by the Coumadin clinic 1 tablet 0   No current facility-administered medications for this visit.      Allergies:   Patient has no known allergies.    Social History:  The patient  reports that he quit smoking about 3 years ago. His smoking use included Cigarettes and E-cigarettes. He has a 32.00 pack-year smoking history. He uses smokeless tobacco. He reports that he drinks alcohol. He reports that he does not use drugs.   Family History:  The patient's family history includes Cancer in his father and mother; Hypertension in his maternal grandmother; Stroke in his maternal aunt.    ROS:  Please see the history of present illness. All other systems are reviewed and negative.    PHYSICAL EXAM: VS:  BP 98/74   Pulse (!) 141   Ht 5\' 9"  (1.753 m)   Wt 188 lb (85.3 kg)   BMI 27.76 kg/m  , BMI Body mass index is 27.76 kg/m. GEN: Well nourished, well developed, male in no acute distress  HEENT: normal for age  Neck: no JVD, no carotid bruit, no masses, bounding R carotid pulse Cardiac: rapid RRR; + murmur but difficult to assess 2nd rapid HR, no rubs, or gallops Respiratory:  clear to auscultation bilaterally, normal work of breathing GI: soft, nontender, nondistended, + BS MS: no deformity or atrophy; no edema; distal pulses are 2+ in all 4 extremities   Skin: warm and dry, no rash Neuro:  Strength and sensation are intact Psych: euthymic mood, full affect   EKG:  EKG is ordered today. The ekg ordered today demonstrates atrial tach, HR 141.  ECHO: 11/28/2016 - Left ventricle: The cavity size was normal. There was mild   concentric hypertrophy. Systolic function was mildly reduced. The   estimated ejection fraction was in the range of 45% to 50%.   Diffuse hypokinesis. The study is not technically sufficient to   allow evaluation of LV diastolic function. - Aorta: Aortic root dimension: 38 mm (ED). - Ascending aorta: The ascending aorta was mildly dilated. - Left atrium: The atrium was mildly dilated.   Recent Labs: 11/19/2016: B Natriuretic Peptide 71.3 11/28/2016: ALT 16;  BUN 6; Creatinine, Ser 0.85; Hemoglobin 10.2; Magnesium 1.8; Platelets 189; Potassium 3.8; Sodium 139; TSH 0.586    Lipid Panel    Component Value Date/Time   CHOL 239 (H) 04/03/2016 0323   CHOL 238 (H) 08/18/2015 0825   TRIG 63 04/03/2016 0323   HDL 126 04/03/2016 0323   HDL 124 08/18/2015 0825   CHOLHDL 1.9 04/03/2016 0323   VLDL 13 04/03/2016 0323   LDLCALC 100 (H) 04/03/2016 0323   LDLCALC 94 08/18/2015 0825     Wt Readings from Last 3 Encounters:  01/07/17 188 lb (85.3 kg)  12/16/16 183 lb (83 kg)  11/27/16 181 lb 11.2 oz (82.4 kg)     Other studies Reviewed: Additional studies/ records that were reviewed today include: office notes and hospital records.  ASSESSMENT AND PLAN: Dr. Oval Linsey reviewed the the data and evaluated the patient  1.  Atrial  tachycardia: He took his metoprolol XL 50 mg this morning but his heart rate is still 140. He is not aware of what it was this morning before he took the medication or when it got to be this fast. I believe that the tachycardia was what caused him to be symptomatic with chest pain when he was exercising today.  Dr. Oval Linsey was in to see Mr. Mignogna and prescribed flecainide 50 mg twice a [place]. Hopefully, this will help him maintain SR  He should be out of work for another week, to make sure he can increase his activity and maintain HR/BP. Ok to increase activity as tolerated. He is encouraged in some kind of a device to track his heart rate as an outpatient.  On the Flecainide, he will need a treadmill in <10 days. He should get this before he goes back to work.  2. Chronic anticoagulation: This should be lifelong, he is tolerating Coumadin well. Dr. Oval Linsey stated that it was okay for him to fly because the acute PE had resolved on his most recent chest CT. This to now stat checked his Coumadin today and is managing it.   Current medicines are reviewed at length with the patient today.  The patient does not have concerns regarding  medicines.  The following changes have been made:  Add flecainide  Labs/ tests ordered today include:   Orders Placed This Encounter  Procedures  . Exercise Tolerance Test     Disposition:   FU with Dr. Oval Linsey  Signed, Marvelle Caudill, Suanne Marker, PA-C  01/07/2017 4:04 PM    [place] Phone: [phone]; Fax: [phone]  This note was written with the assistance of speech recognition software. Please excuse any transcriptional errors.

## 2017-01-07 NOTE — Patient Instructions (Addendum)
Medication Instructions:  START Flecainide 50 mg Take 1 tablet by mouth twice a [place]   Labwork: None   Testing/Procedures: Your physician has requested that you have an exercise tolerance test. For further information please visit [email or web address]. Please also follow instruction sheet, as given.  SCHEDULE IN 2 WEEKS  Follow-Up: Your physician recommends that you schedule a follow-up appointment in: [place], [place]  Any Other Special Instructions Will Be Listed Below (If Applicable).  If you need a refill on your cardiac medications before your next appointment, please call your pharmacy.

## 2017-01-07 NOTE — Telephone Encounter (Signed)
New Message  Pt voiced wanting to know if we can write a brief note as to why he cannot return to work and he'll pick it up on Tuesday.  Please f/u

## 2017-01-07 NOTE — Telephone Encounter (Signed)
Will ask T to do this, it will be ready. Thanks

## 2017-01-07 NOTE — Telephone Encounter (Signed)
New message   Per pt is at [place] now waiting  *STAT* If patient is at the pharmacy, call can be transferred to refill team.   1. Which medications need to be refilled? (please list name of each medication and dose if known)   metoprolol succinate (TOPROL XL) 25 MG 24 hr tablet   2. Which pharmacy/location (including street and city if local pharmacy) is medication to be sent to?[facility], [facility] - [address]  3. Do they need a [address] or [address] supply? [address]

## 2017-01-07 NOTE — Telephone Encounter (Signed)
Patient seen in the office today by Donnie Aho PA and Dr Oval Linsey Patient had been instructed by PCP in increase Metoprolol to 50 mg daily and requested refill  Ok per Dr Oval Linsey to refill at 50 mg daily  Rx sent to pharmacy

## 2017-01-10 ENCOUNTER — Ambulatory Visit: Payer: Self-pay

## 2017-01-10 VITALS — Ht 69.0 in | Wt 188.0 lb

## 2017-01-11 ENCOUNTER — Ambulatory Visit (INDEPENDENT_AMBULATORY_CARE_PROVIDER_SITE_OTHER): Payer: No Typology Code available for payment source | Admitting: Cardiovascular Disease

## 2017-01-11 ENCOUNTER — Encounter: Payer: Self-pay | Admitting: Cardiovascular Disease

## 2017-01-11 VITALS — BP 135/91 | HR 95 | Ht 69.0 in | Wt 189.4 lb

## 2017-01-11 DIAGNOSIS — Z5181 Encounter for therapeutic drug level monitoring: Secondary | ICD-10-CM | POA: Diagnosis not present

## 2017-01-11 DIAGNOSIS — I471 Supraventricular tachycardia: Secondary | ICD-10-CM | POA: Diagnosis not present

## 2017-01-11 DIAGNOSIS — I5042 Chronic combined systolic (congestive) and diastolic (congestive) heart failure: Secondary | ICD-10-CM

## 2017-01-11 DIAGNOSIS — Z9889 Other specified postprocedural states: Secondary | ICD-10-CM | POA: Diagnosis not present

## 2017-01-11 LAB — MAGNESIUM: Magnesium: 1.6 mg/dL (ref 1.5–2.5)

## 2017-01-11 LAB — BASIC METABOLIC PANEL
BUN: 8 mg/dL (ref 7–25)
CO2: 27 mmol/L (ref 20–31)
Calcium: 9.4 mg/dL (ref 8.6–10.3)
Chloride: 100 mmol/L (ref 98–110)
Creat: 1.2 mg/dL (ref 0.70–1.33)
Glucose, Bld: 88 mg/dL (ref 65–99)
Potassium: 4.5 mmol/L (ref 3.5–5.3)
Sodium: 138 mmol/L (ref 135–146)

## 2017-01-11 NOTE — Progress Notes (Signed)
Cardiology Office Note   Date:  01/12/2017   ID:  Leonard Bentley, DOB 1961/10/03, MRN [id number]  PCP:  Wyatt Haste, MD  Cardiologist:   [name], MD   Chief Complaint  Patient presents with  . Follow-up    1 week;      History of Present Illness:  Leonard Bentley is a 56 y.o. male with severe mitral regurgitation s/p mitral valve repair, chronic systolic heart failure LVEF 45-50%, alcohol abuse, tobacco abuse, recurrent PE, and non-Hodgkin's lymphoma s/p chemotherapy who presents for follow up.  Mr. Leonard Bentley was initially evaluated 10/2015 after his PCP, Dr. Jill Bentley, noted that he had Mobitz I.  He was referred to cardiology, where he reported that he wasn't able to run as long as he used to.  He was referred for exercise Cardiolite that revealed LVEF 52% and no ischemia. On 01/20/16 he had an echo that was revealed LVEF 60-65% with moderate LVH and posterior mitral valve leaflet prolapse. There was moderate to severe mitral regurgitation.  He was referred for transesophageal echocardiography which confirmed moderate to severe mitral regurgitation. The left atrium was massively dilated but there was no left ventricle dilation.  Pulmonary pressures were normal.   He was referred to Dr. Ricard Bentley for consideration of mitral valve replacement/repair.  He had a chest CT on 04/09/16 for preoperative assessment and was noted to have a pulmonary embolism in the right lower lobe.  Leonard Bentley had a PE several years ago in the setting of testosterone therapy.  He was started on Xarelto anticoagulation indefinitely.  He had a follow up CT 07/20/16 that showed no acute thrombus and resolution of most of the prior PE.  There was a minimal, chronic linear thrombus in the RLL.  On 10/27/16 he underwent minimally invasive mitral valve repair with a 34 mm Edwards ring.  Echo 11/28/16 revealed LVEF 45-50% with diffuse hypokinesis and a mildly dilated ascending aorta.  Since the surgery Leonard Bentley was seen in the ED  11/19/16 with an episode of SVT.  He was noted ot have sinus tachycardia vs atrial tachycardia with rates in the 130s that improved with lorazepam.  There was no evidence of heart failure and metoprolol was increased to 50 mg daily.  He followed up with [name], PA-C on 01/07/17 and was noted to be in atrial tachycardia with a heart rate of 141 bpm.  His BP was 98/74 so he was started on flecainide 50 mg bid. Since starting flecainide he has been feeling much better. He no longer hears his heart racing when he sleeps. He hasn't had any episodes of breath. Heart rates that he is aware of. He is feeling better overall and has started back exercising. He notes that he is deconditioned and isn't able to run as long as he used to in the past.  Prior to starting flecainide his exercise was limited by tachycardia. He denies any recent lower extremity edema, orthopnea, or PND.   Past Medical History:  Diagnosis Date  . Acute pulmonary embolism ([place]) 04/11/2016  . Anxiety   . Arthritis   . Atrial tachycardia ([place]) 01/06/2016  . Cancer ([place]) 07/2011   STAGE II NON- HODGKIN LYMPHOMA  . Depression   . Dyspnea   . ED (erectile dysfunction)   . Lymphoma, non Hodgkin's 09/25/2011  . Mitral regurgitation 01/20/2016  . Mobitz I 10/09/2015  . Murmur 01/06/2016  . Occasional tremors   . PAC (premature atrial contraction) 10/09/2015  .  S/P minimally-invasive mitral valve repair 10/27/2016   Complex valvuloplasty including artificial Gore-tex neochord placement x6 and 34 mm Edwards Physio II ring annuloplasty via right mini thoracotomy approach    Past Surgical History:  Procedure Laterality Date  . CARDIAC CATHETERIZATION N/A 08/13/2016   Procedure: Right/Left Heart Cath and Coronary Angiography;  Surgeon: [name], MD;  Location: [facility];  Service: Cardiovascular;  Laterality: N/A;  . MITRAL VALVE REPAIR Right 10/27/2016   Procedure: MINIMALLY INVASIVE MITRAL VALVE REPAIR (MVR) USING 34  PHYSIO II ANNULOPLASTY RING;  Surgeon: [name], MD;  Location: [facility];  Service: Open Heart Surgery;  Laterality: Right;  . NO PAST SURGERIES    . TEE WITHOUT CARDIOVERSION N/A 02/11/2016   Procedure: TRANSESOPHAGEAL ECHOCARDIOGRAM (TEE);  Surgeon: [name], MD;  Location: [facility];  Service: Cardiovascular;  Laterality: N/A;  . TEE WITHOUT CARDIOVERSION N/A 10/27/2016   Procedure: TRANSESOPHAGEAL ECHOCARDIOGRAM (TEE);  Surgeon: [name], MD;  Location: [facility];  Service: Open Heart Surgery;  Laterality: N/A;  . TRIGGER FINGER RELEASE Bilateral      Current Outpatient Prescriptions  Medication Sig Dispense Refill  . ALPRAZolam (XANAX) 0.25 MG tablet Take 1 tablet (0.25 mg total) by mouth 2 (two) times daily as needed for anxiety. 30 tablet 0  . flecainide (TAMBOCOR) 50 MG tablet Take 1 tablet (50 mg total) by mouth 2 (two) times daily. 60 tablet 3  . metoprolol succinate (TOPROL-XL) 50 MG 24 hr tablet Take 1 tablet (50 mg total) by mouth daily. 30 tablet 5  . omeprazole (PRILOSEC) 40 MG capsule Take 1 capsule (40 mg total) by mouth daily. 30 capsule 1  . warfarin (COUMADIN) 5 MG tablet Take 1 tablet (5 mg total) by mouth daily at 6 PM. Get your INR checked on 11/29/2016 at the Coumadin clinic and then restart Coumadin as instructed by the Coumadin clinic 1 tablet 0   No current facility-administered medications for this visit.     Allergies:   Patient has no known allergies.    Social History:  The patient  reports that he quit smoking about 3 years ago. His smoking use included Cigarettes and E-cigarettes. He has a 32.00 pack-year smoking history. He uses smokeless tobacco. He reports that he drinks alcohol. He reports that he does not use drugs.   Family History:  The patient's family history includes Cancer in his father and mother; Hypertension in his maternal grandmother; Stroke in his maternal aunt.    ROS:  Please see the history of present illness.    Otherwise, review of systems are positive for anxiety.   All other systems are reviewed and negative.    PHYSICAL EXAM: VS:  BP (!) 135/91   Pulse 95   Ht 5\' 9"  (1.753 m)   Wt 85.9 kg (189 lb 6.4 oz)   BMI 27.97 kg/m  , BMI Body mass index is 27.97 kg/m. GENERAL:  Well appearing HEENT:  Pupils equal round and reactive, fundi not visualized, oral mucosa unremarkable NECK:  No jugular venous distention, waveform within normal limits, carotid upstroke brisk and symmetric, no bruits LYMPHATICS:  No cervical adenopathy LUNGS:  Clear to auscultation bilaterally HEART:  RRR.  PMI not displaced or sustained,S1 and S2 within normal limits, + S3, no S4, no clicks, no rubs, II/VI holosystolic murmur at the apex ABD:  Flat, positive bowel sounds normal in frequency in pitch, no bruits, no rebound, no guarding, no midline pulsatile mass, no hepatomegaly, no splenomegaly  EXT:  2 plus pulses throughout, no edema, no cyanosis no clubbing SKIN:  No rashes no nodules NEURO:  Cranial nerves II through XII grossly intact, motor grossly intact throughout PSYCH:  Cognitively intact, oriented to person place and time   EKG:  EKG is ordered today. 04/09/16: Sinus arrhythmia. Rate 92 bpm. First degree AV block. 09/15/15: sinus rhythm rate 87 bpm. PACs. Second degree heart block, Mobitz I.  LAFB 01/11/17: Sinus rhythm.  Rate 74 bpm.  PACs.  LAFB.  Non-specific T wave abnormalities  Exercise Cardiolite 10/15/15:  Nuclear stress EF: 52%.  There was no ST segment deviation noted during stress.  No T wave inversion was noted during stress.  The study is normal.  This is a low risk study.  Low risk stress nuclear study with normal perfusion and normal left ventricular regional systolic function. Borderline global LVEF 52%.  Echo 01/20/16: Study Conclusions  - Left ventricle: The cavity size was normal. Wall thickness was  increased in a pattern of moderate LVH. Systolic function was  normal. The  estimated ejection fraction was in the range of 60%  to 65%. Wall motion was normal; there were no regional wall  motion abnormalities. The study is not technically sufficient to  allow evaluation of LV diastolic function. - Aortic valve: Sclerosis without stenosis. There was trivial  regurgitation. - Mitral valve: Mildly thickened leaflets with predominate prolapse  of the posterior leaflet and anteriorly directed mitral  regurgitation. There was moderate to severe eccentric  regurgitation directed at the atrial septum. - Left atrium: Massively dilated at 85 ml/m2. - Tricuspid valve: There was mild regurgitation. - Pulmonary arteries: PA peak pressure: 27 mm Hg (S). - Inferior vena cava: The vessel was normal in size. The  respirophasic diameter changes were in the normal range (>= 50%),  consistent with normal central venous pressure.  Impressions:  - LVEF 60-65%, moderate LVH, trivial AI, moderate to more likely  severe eccentric MR directed anteriorly and to the interatrial  septum, posterior mitral valve leaflet prolpase, massive LAE,  mild TR, RVSP 27 mmHg, normal IVC.  Echo 1/21/118:  - Left ventricle: The cavity size was normal. There was mild   concentric hypertrophy. Systolic function was mildly reduced. The   estimated ejection fraction was in the range of 45% to 50%.   Diffuse hypokinesis. The study is not technically sufficient to   allow evaluation of LV diastolic function. - Aorta: Aortic root dimension: 38 mm (ED). - Ascending aorta: The ascending aorta was mildly dilated. - Left atrium: The atrium was mildly dilated.  Recent Labs: 11/19/2016: B Natriuretic Peptide 71.3 11/28/2016: ALT 16; Hemoglobin 10.2; Platelets 189; TSH 0.586 01/11/2017: BUN 8; Creat 1.20; Magnesium 1.6; Potassium 4.5; Sodium 138    Lipid Panel    Component Value Date/Time   CHOL 239 (H) 04/03/2016 0323   CHOL 238 (H) 08/18/2015 0825   TRIG 63 04/03/2016 0323   HDL 126  04/03/2016 0323   HDL 124 08/18/2015 0825   CHOLHDL 1.9 04/03/2016 0323   VLDL 13 04/03/2016 0323   LDLCALC 100 (H) 04/03/2016 0323   LDLCALC 94 08/18/2015 0825      Wt Readings from Last 3 Encounters:  01/11/17 85.9 kg (189 lb 6.4 oz)  01/10/17 85.3 kg (188 lb)  01/07/17 85.3 kg (188 lb)      ASSESSMENT AND PLAN:  # PAT, PACs: Much better-controlled since starting flecainide. Continue metoprolol and flecainide. We will check a basic metabolic panel and magnesium today.  #  Chronic systolic and diastolic heart failure:  LVEF was reduced to 45-50% after mitral valve surgery. This is likely result of the mitral regurgitation. Hopefully this will improve over time. We will plan to repeat his echocardiogram 02/2017. He does not have any evidence of heart failure on examination this time. If his heart failure does not improve and he continues to have atrial tachyarrhythmias we will likely need to switch flecainide to an alternative agent.  # Pulmonary Embolism: Mr. Snook had a recurrent PE and will therefore require lifelong anticoagulation.    # Mitral Regurgitation s/p repair: Mr. Wible underwent successful minimally invasive mitral valve repair with Dr. Roxy Manns on 10/27/16. He has been doing well clinically but continues to struggle with atrial arrhythmias.  Postoperative echo showed only trivial mitral regurgitation.  # Anxiety: Mr. Malmberg struggles with significant anxiety and is only taking Xanax as needed. Recommended that he discuss this with his PCP. He would likely benefit from a preventative medication.      Current medicines are reviewed at length with the patient today.  The patient does not have concerns regarding medicines.  The following changes have been made:  none  Labs/ tests ordered today include:   Orders Placed This Encounter  Procedures  . Basic metabolic panel  . Magnesium  . EKG 12-Lead  . ECHOCARDIOGRAM COMPLETE     Disposition:   FU in 3  months.   Signed, Roselyn Doby C. Oval Linsey, MD, [facility]  01/12/2017 8:31 AM    [place] and

## 2017-01-11 NOTE — Patient Instructions (Addendum)
Medication Instructions:  Your physician recommends that you continue on your current medications as directed. Please refer to the Current Medication list given to you today.  Labwork: BMET/MAGNESIUM AT [facility] ON THE FIRST FLOOR  Testing/Procedures: Your physician has requested that you have an echocardiogram. Echocardiography is a painless test that uses sound waves to create images of your heart. It provides your doctor with information about the size and shape of your heart and how well your heart's chambers and valves are working. This procedure takes approximately one hour. There are no restrictions for this procedure. [address]  END OF MARCH OR FIRST OF April   Follow-Up: Your physician recommends that you schedule a follow-up appointment in: [place]  If you need a refill on your cardiac medications before your next appointment, please call your pharmacy.

## 2017-01-12 ENCOUNTER — Telehealth (HOSPITAL_COMMUNITY): Payer: Self-pay

## 2017-01-12 NOTE — Telephone Encounter (Signed)
Encounter complete. 

## 2017-01-13 ENCOUNTER — Telehealth (HOSPITAL_COMMUNITY): Payer: Self-pay

## 2017-01-13 NOTE — Telephone Encounter (Signed)
Encounter complete. 

## 2017-01-13 NOTE — Addendum Note (Signed)
Addended by: Vennie Homans on: 01/13/2017 08:13 AM   Modules accepted: Orders

## 2017-01-14 ENCOUNTER — Inpatient Hospital Stay (HOSPITAL_COMMUNITY): Admission: RE | Admit: 2017-01-14 | Payer: No Typology Code available for payment source | Source: Ambulatory Visit

## 2017-01-14 ENCOUNTER — Telehealth: Payer: Self-pay

## 2017-01-14 NOTE — Telephone Encounter (Signed)
Pt request refill of alprazolam to [facility]. He knows you are out of town until Monday. Victor[place] December

## 2017-01-14 NOTE — Telephone Encounter (Signed)
ok 

## 2017-01-17 ENCOUNTER — Other Ambulatory Visit: Payer: Self-pay

## 2017-01-17 DIAGNOSIS — F418 Other specified anxiety disorders: Secondary | ICD-10-CM

## 2017-01-17 MED ORDER — ALPRAZOLAM 0.25 MG PO TABS: 0.2500 mg | ORAL_TABLET | Freq: Two times a day (BID) | ORAL | 0 refills | Status: DC | PRN

## 2017-01-17 NOTE — Telephone Encounter (Signed)
Called in xanax per jlc

## 2017-01-19 ENCOUNTER — Telehealth: Payer: Self-pay | Admitting: Cardiovascular Disease

## 2017-01-19 NOTE — Telephone Encounter (Signed)
Patient states he is returning call in regards to results. Please call, thanks.

## 2017-01-19 NOTE — Telephone Encounter (Signed)
-----   Message from [name], MD sent at 01/12/2017 10:34 PM EST ----- Labs are all OK

## 2017-01-19 NOTE — Telephone Encounter (Signed)
Notes Recorded by Earvin Hansen on 01/19/2017 at 9:03 AM EDT Advised patient of lab results ------

## 2017-01-20 ENCOUNTER — Encounter (HOSPITAL_COMMUNITY): Payer: No Typology Code available for payment source

## 2017-01-24 ENCOUNTER — Encounter: Payer: Self-pay | Admitting: Thoracic Surgery (Cardiothoracic Vascular Surgery)

## 2017-01-26 NOTE — Progress Notes (Unsigned)
This encounter was created in error - please disregard.

## 2017-01-27 NOTE — Progress Notes (Signed)
This encounter was created in error - please disregard.

## 2017-02-01 ENCOUNTER — Encounter (HOSPITAL_COMMUNITY): Payer: Self-pay | Admitting: Physician Assistant

## 2017-02-01 ENCOUNTER — Encounter: Payer: Self-pay | Admitting: Thoracic Surgery (Cardiothoracic Vascular Surgery)

## 2017-02-08 ENCOUNTER — Other Ambulatory Visit (HOSPITAL_COMMUNITY): Payer: No Typology Code available for payment source

## 2017-02-09 ENCOUNTER — Ambulatory Visit (HOSPITAL_COMMUNITY): Admission: RE | Admit: 2017-02-09 | Payer: No Typology Code available for payment source | Source: Ambulatory Visit

## 2017-02-09 ENCOUNTER — Telehealth: Payer: Self-pay | Admitting: Internal Medicine

## 2017-02-09 ENCOUNTER — Other Ambulatory Visit: Payer: No Typology Code available for payment source

## 2017-02-09 NOTE — Telephone Encounter (Signed)
Rescheduled lab appt per Rn Diane sch message to go before CT appt

## 2017-02-14 ENCOUNTER — Other Ambulatory Visit: Payer: No Typology Code available for payment source

## 2017-02-14 ENCOUNTER — Ambulatory Visit (HOSPITAL_COMMUNITY): Admission: RE | Admit: 2017-02-14 | Payer: No Typology Code available for payment source | Source: Ambulatory Visit

## 2017-02-16 ENCOUNTER — Ambulatory Visit: Payer: No Typology Code available for payment source | Admitting: Internal Medicine

## 2017-02-16 ENCOUNTER — Telehealth (HOSPITAL_COMMUNITY): Payer: Self-pay | Admitting: Physician Assistant

## 2017-02-22 ENCOUNTER — Encounter (INDEPENDENT_AMBULATORY_CARE_PROVIDER_SITE_OTHER): Payer: Self-pay

## 2017-02-22 ENCOUNTER — Other Ambulatory Visit: Payer: Self-pay

## 2017-02-22 ENCOUNTER — Telehealth: Payer: Self-pay | Admitting: Cardiovascular Disease

## 2017-02-22 ENCOUNTER — Ambulatory Visit (HOSPITAL_COMMUNITY): Payer: No Typology Code available for payment source | Attending: Cardiovascular Disease

## 2017-02-22 DIAGNOSIS — I441 Atrioventricular block, second degree: Secondary | ICD-10-CM | POA: Insufficient documentation

## 2017-02-22 DIAGNOSIS — I071 Rheumatic tricuspid insufficiency: Secondary | ICD-10-CM | POA: Insufficient documentation

## 2017-02-22 DIAGNOSIS — Z720 Tobacco use: Secondary | ICD-10-CM | POA: Diagnosis not present

## 2017-02-22 DIAGNOSIS — I371 Nonrheumatic pulmonary valve insufficiency: Secondary | ICD-10-CM | POA: Insufficient documentation

## 2017-02-22 DIAGNOSIS — I5042 Chronic combined systolic (congestive) and diastolic (congestive) heart failure: Secondary | ICD-10-CM

## 2017-02-22 NOTE — Telephone Encounter (Signed)
  02/16/2017 02:23 PM Phone ([place]) [place], Giorgio Chabot (Self) [phone] (H)   Left Message - Called pt and lmsg for him to CB to sch ETT.    By Verdene Rio

## 2017-02-22 NOTE — Telephone Encounter (Signed)
Pt is having an echo today. He wants to know if it is alright for him to take his Metoprolol and Flecainide before the test? If he does not answer,please leave a message to let him know.

## 2017-02-22 NOTE — Telephone Encounter (Signed)
Returned call to patient-advised no restrictions for echo-verbalized understanding.

## 2017-02-24 ENCOUNTER — Encounter: Payer: Self-pay | Admitting: Family Medicine

## 2017-02-24 ENCOUNTER — Ambulatory Visit (INDEPENDENT_AMBULATORY_CARE_PROVIDER_SITE_OTHER): Payer: PRIVATE HEALTH INSURANCE | Admitting: Family Medicine

## 2017-02-24 VITALS — BP 116/80 | HR 85 | Ht 69.0 in | Wt 191.0 lb

## 2017-02-24 DIAGNOSIS — B351 Tinea unguium: Secondary | ICD-10-CM | POA: Diagnosis not present

## 2017-02-24 DIAGNOSIS — S31809A Unspecified open wound of unspecified buttock, initial encounter: Secondary | ICD-10-CM | POA: Diagnosis not present

## 2017-02-24 NOTE — Progress Notes (Signed)
   Subjective:    Patient ID: Leonard Bentley, male    DOB: 1961-06-15, 56 y.o.   MRN: [id number]  HPI He noted a lesion in the midportion of his gluteal area approximately one week ago that did get large but then the swelling went away. He noted no drainage from that area. He is here to be checked to make sure there is nothing to worry about. He also has noted scaling and thickening of his nails on his feet.   Review of Systems     Objective:   Physical Exam  Alert and in no distress. Exam of the gluteal cleft does show an area of slight erythema and rawness but no palpable induration or drainage.       Assessment & Plan:  Wound of gluteal cleft, unspecified laterality, initial encounter  Onychomycosis I explained that this could've been a small little cyst but since it went away, no further intervention necessary. Also discussed treatment of the onychomycosis and under the present circumstances do not feel as if placing him on a medication at this point would be appropriate. He was comfortable with that.

## 2017-02-28 ENCOUNTER — Telehealth (HOSPITAL_COMMUNITY): Payer: Self-pay | Admitting: Cardiovascular Disease

## 2017-02-28 NOTE — Telephone Encounter (Signed)
User: Verdene Rio Date/time: 02/16/2017 2:24 PM  Comment: Called pt and lmsg for him to CB to sch ETT  Context: [place] Schedule Orders/Appt Requests Outcome: Left Message  Phone number: [phone] Phone Type: Home Phone  Comm. type: Telephone Call type: Outgoing  Contact: Moralez, Lina Sayre Relation to patient: Self  Letter:      User: Verdene Rio Date/time: 02/01/2017 10:12 AM  Comment: Called pt but his mailbox was full. Will send letter remove after 02/15/17.Marland KitchenRG   Context: [place] Schedule Orders/Appt Requests Outcome: Not Available  Phone number: [phone] Phone Type: Home Phone  Comm. type: Telephone Call type: Outgoing  Contact: Conklin, Lina Sayre Relation to patient: Self  Letter:

## 2017-03-14 ENCOUNTER — Ambulatory Visit: Payer: PRIVATE HEALTH INSURANCE | Admitting: Family Medicine

## 2017-03-15 ENCOUNTER — Ambulatory Visit: Payer: Self-pay | Admitting: Registered Nurse

## 2017-03-15 ENCOUNTER — Other Ambulatory Visit: Payer: Self-pay | Admitting: Medical Oncology

## 2017-03-15 VITALS — BP 120/90 | HR 76 | Temp 98.3°F

## 2017-03-15 DIAGNOSIS — L089 Local infection of the skin and subcutaneous tissue, unspecified: Secondary | ICD-10-CM

## 2017-03-15 DIAGNOSIS — Z8572 Personal history of non-Hodgkin lymphomas: Secondary | ICD-10-CM

## 2017-03-15 MED ORDER — TRIPLE ANTIBIOTIC 5-400-5000 EX OINT: TOPICAL_OINTMENT | Freq: Four times a day (QID) | CUTANEOUS | 0 refills | Status: AC | PRN

## 2017-03-15 MED ORDER — HYDROCORTISONE 1 % EX LOTN: 1.0000 "application " | TOPICAL_LOTION | Freq: Two times a day (BID) | CUTANEOUS | 0 refills | Status: AC | PRN

## 2017-03-15 NOTE — Progress Notes (Signed)
Subjective:    Patient ID: Leonard Bentley, male    DOB: 12/13/1960, 56 y.o.   MRN: [id number]  HPI 56y/o african american male Pt presents with small bump to L temple area x6 days. Reports he has been placing alcohol on it and it has been getting smaller. Non painful. Non-hardened. Non-fluid filled. Slight erythema to central bump but not extending. Denied fever/headache, facial swelling, discharge, visual changes, nausea, vomiting, and/or ear discharge   Review of Systems  Constitutional: Negative for chills, diaphoresis and fever.  HENT: Negative for congestion, dental problem, ear pain and trouble swallowing.   Eyes: Negative for pain and discharge.  Respiratory: Negative for cough and wheezing.   Cardiovascular: Negative for chest pain.  Gastrointestinal: Negative for nausea and vomiting.  Genitourinary: Negative for difficulty urinating.  Musculoskeletal: Negative for neck pain and neck stiffness.  Skin: Positive for color change and rash. Negative for pallor and wound.  Allergic/Immunologic: Negative for environmental allergies and food allergies.  Neurological: Negative for dizziness, tremors, seizures, syncope, facial asymmetry, speech difficulty, weakness, light-headedness, numbness and headaches.  Hematological: Negative for adenopathy. Does not bruise/bleed easily.  Psychiatric/Behavioral: Negative for agitation and confusion. The patient is not nervous/anxious.        Objective:   Physical Exam  Constitutional: He is oriented to person, place, and time. Vital signs are normal. He appears well-developed and well-nourished. He is cooperative.  Non-toxic appearance. He does not have a sickly appearance. He does not appear ill. No distress.  HENT:  Head: Normocephalic and atraumatic.    Right Ear: Hearing and external ear normal.  Left Ear: Hearing and external ear normal.  Nose: Nose normal. Right sinus exhibits no maxillary sinus tenderness and no frontal sinus tenderness.  Left sinus exhibits no maxillary sinus tenderness and no frontal sinus tenderness.  Mouth/Throat: Uvula is midline and mucous membranes are normal. No oropharyngeal exudate, posterior oropharyngeal edema, posterior oropharyngeal erythema or tonsillar abscesses.  68mm pustule/papule left temple surrounded by macular erythema 19mm no fluctuance not TTP dry  Eyes: Conjunctivae, EOM and lids are normal. Pupils are equal, round, and reactive to light. Right eye exhibits no discharge. Left eye exhibits no discharge. No scleral icterus.  Neck: Trachea normal, normal range of motion and phonation normal. Neck supple. No spinous process tenderness and no muscular tenderness present. No neck rigidity. No tracheal deviation, no edema, no erythema and normal range of motion present.  Cardiovascular: Normal rate, regular rhythm, normal heart sounds and intact distal pulses.   Pulmonary/Chest: Effort normal and breath sounds normal. No stridor. No respiratory distress. He has no wheezes.  Abdominal: Soft. Bowel sounds are normal.  Musculoskeletal: Normal range of motion. He exhibits no edema, tenderness or deformity.       Right shoulder: Normal.       Left shoulder: Normal.       Right elbow: Normal.      Left elbow: Normal.       Right hip: Normal.       Left hip: Normal.       Right knee: Normal.       Left knee: Normal.       Cervical back: Normal.       Right hand: Normal.       Left hand: Normal.  Lymphadenopathy:       Head (right side): No submental, no submandibular, no tonsillar, no preauricular, no posterior auricular and no occipital adenopathy present.       Head (left  side): No submental, no submandibular, no tonsillar, no preauricular, no posterior auricular and no occipital adenopathy present.    He has no cervical adenopathy.       Right cervical: No superficial cervical, no deep cervical and no posterior cervical adenopathy present.      Left cervical: No superficial cervical, no deep  cervical and no posterior cervical adenopathy present.  Neurological: He is alert and oriented to person, place, and time. He has normal strength. He is not disoriented. He displays no atrophy and no tremor. No cranial nerve deficit or sensory deficit. He exhibits normal muscle tone. He displays no seizure activity. Coordination and gait normal. GCS eye subscore is 4. GCS verbal subscore is 5. GCS motor subscore is 6.  Skin: Skin is warm, dry and intact. Rash noted. No abrasion, no bruising, no burn, no ecchymosis, no laceration, no lesion, no petechiae and no purpura noted. Rash is macular, papular, maculopapular and pustular. Rash is not nodular, not vesicular and not urticarial. He is not diaphoretic. There is erythema. No cyanosis. No pallor. Nails show no clubbing.     Psychiatric: He has a normal mood and affect. His speech is normal and behavior is normal. Judgment and thought content normal. Cognition and memory are normal.  Nursing note and vitals reviewed.         Assessment & Plan:  A-pustule facial  P- Do not pick/squeeze/scratch pimple Wash twice a [place] with soap and water May apply hydrocortisone 1% lotion twice a [place] for itching/inflammation x 1 week given 3 UD packets from clinic stock May apply triple antibiotic to affected area if drainage/opens up four times a [place] for 7-10 days until healed given 4 UD packets from clinic stock May apply ice as needed for swelling/pain 15 minutes up to three times per [place] Follow up re-evaluation if  facial swelling, purulent discharge, fever, headache.  Patient given exitcare handout on cellulitis.  Patient verbalized understanding information/instructions, agreed with plan of care and had no further questions at this time.

## 2017-03-15 NOTE — Patient Instructions (Signed)
Do not pick/squeeze/scratch pimple Wash twice a [place] with soap and water May apply hydrocortisone 1% lotion twice a [place] for itching/inflammation x 1 week May apply triple antibiotic to affected area if drainage/opens up four times a [place] for 7-10 days until healed May apply ice as needed for swelling/pain 15 minutes up to three times per [place] Follow up re-evaluation facial swelling, purulent discharge, fever, headache  Cellulitis, Adult Cellulitis is a skin infection. The infected area is usually red and tender. This condition occurs most often in the arms and lower legs. The infection can travel to the muscles, blood, and underlying tissue and become serious. It is very important to get treated for this condition. What are the causes? Cellulitis is caused by bacteria. The bacteria enter through a break in the skin, such as a cut, burn, insect bite, open sore, or crack. What increases the risk? This condition is more likely to occur in people who:  Have a weak defense system (immune system).  Have open wounds on the skin such as cuts, burns, bites, and scrapes. Bacteria can enter the body through these open wounds.  Are older.  Have diabetes.  Have a type of long-lasting (chronic) liver disease (cirrhosis) or kidney disease.  Use IV drugs. What are the signs or symptoms? Symptoms of this condition include:  Redness, streaking, or spotting on the skin.  Swollen area of the skin.  Tenderness or pain when an area of the skin is touched.  Warm skin.  Fever.  Chills.  Blisters. How is this diagnosed? This condition is diagnosed based on a medical history and physical exam. You may also have tests, including:  Blood tests.  Lab tests.  Imaging tests. How is this treated? Treatment for this condition may include:  Medicines, such as antibiotic medicines or antihistamines.  Supportive care, such as rest and application of cold or warm cloths (cold or warm compresses) to the  skin.  Hospital care, if the condition is severe. The infection usually gets better within 1-2 days of treatment. Follow these instructions at home:  Take over-the-counter and prescription medicines only as told by your health care provider.  If you were prescribed an antibiotic medicine, take it as told by your health care provider. Do not stop taking the antibiotic even if you start to feel better.  Drink enough fluid to keep your urine clear or pale yellow.  Do not touch or rub the infected area.  Raise (elevate) the infected area above the level of your heart while you are sitting or lying down.  Apply warm or cold compresses to the affected area as told by your health care provider.  Keep all follow-up visits as told by your health care provider. This is important. These visits let your health care provider make sure a more serious infection is not developing. Contact a health care provider if:  You have a fever.  Your symptoms do not improve within 1-2 days of starting treatment.  Your bone or joint underneath the infected area becomes painful after the skin has healed.  Your infection returns in the same area or another area.  You notice a swollen bump in the infected area.  You develop new symptoms.  You have a general ill feeling (malaise) with muscle aches and pains. Get help right away if:  Your symptoms get worse.  You feel very sleepy.  You develop vomiting or diarrhea that persists.  You notice red streaks coming from the infected area.  Your  red area gets larger or turns dark in color. This information is not intended to replace advice given to you by your health care provider. Make sure you discuss any questions you have with your health care provider. Document Released: 08/04/2005 Document Revised: 03/04/2016 Document Reviewed: 09/03/2015 Elsevier Interactive Patient Education  2017 [employer].

## 2017-03-16 ENCOUNTER — Encounter: Payer: Self-pay | Admitting: Family Medicine

## 2017-03-21 ENCOUNTER — Other Ambulatory Visit (HOSPITAL_BASED_OUTPATIENT_CLINIC_OR_DEPARTMENT_OTHER): Payer: No Typology Code available for payment source

## 2017-03-21 ENCOUNTER — Encounter (HOSPITAL_COMMUNITY): Payer: Self-pay

## 2017-03-21 ENCOUNTER — Ambulatory Visit (HOSPITAL_COMMUNITY)
Admission: RE | Admit: 2017-03-21 | Discharge: 2017-03-21 | Disposition: A | Discharge status: Home / Self Care | Payer: No Typology Code available for payment source | Source: Ambulatory Visit | Attending: Internal Medicine | Admitting: Internal Medicine

## 2017-03-21 DIAGNOSIS — R590 Localized enlarged lymph nodes: Secondary | ICD-10-CM | POA: Insufficient documentation

## 2017-03-21 DIAGNOSIS — I2699 Other pulmonary embolism without acute cor pulmonale: Secondary | ICD-10-CM

## 2017-03-21 DIAGNOSIS — G620 Drug-induced polyneuropathy: Secondary | ICD-10-CM

## 2017-03-21 DIAGNOSIS — T451X5A Adverse effect of antineoplastic and immunosuppressive drugs, initial encounter: Secondary | ICD-10-CM

## 2017-03-21 DIAGNOSIS — C8590 Non-Hodgkin lymphoma, unspecified, unspecified site: Secondary | ICD-10-CM | POA: Diagnosis not present

## 2017-03-21 DIAGNOSIS — R918 Other nonspecific abnormal finding of lung field: Secondary | ICD-10-CM | POA: Insufficient documentation

## 2017-03-21 DIAGNOSIS — Z8572 Personal history of non-Hodgkin lymphomas: Secondary | ICD-10-CM | POA: Diagnosis not present

## 2017-03-21 HISTORY — DX: Essential (primary) hypertension: I10

## 2017-03-21 LAB — COMPREHENSIVE METABOLIC PANEL
ALT: 41 U/L (ref 0–55)
AST: 62 U/L — ABNORMAL HIGH (ref 5–34)
Albumin: 3.9 g/dL (ref 3.5–5.0)
Alkaline Phosphatase: 61 U/L (ref 40–150)
Anion Gap: 9 mEq/L (ref 3–11)
BUN: 10.5 mg/dL (ref 7.0–26.0)
CO2: 24 mEq/L (ref 22–29)
Calcium: 9.3 mg/dL (ref 8.4–10.4)
Chloride: 104 mEq/L (ref 98–109)
Creatinine: 0.9 mg/dL (ref 0.7–1.3)
EGFR: >90 mL/min/{1.73_m2} (ref >90)
Glucose: 96 mg/dl (ref 70–140)
Potassium: 4.2 mEq/L (ref 3.5–5.1)
Sodium: 138 mEq/L (ref 136–145)
Total Bilirubin: 0.52 mg/dL (ref 0.20–1.20)
Total Protein: 7.8 g/dL (ref 6.4–8.3)

## 2017-03-21 LAB — CBC WITH DIFFERENTIAL/PLATELET
BASO%: 2 % (ref 0.0–2.0)
Basophils Absolute: 0.1 10*3/uL (ref 0.0–0.1)
EOS%: 2.8 % (ref 0.0–7.0)
Eosinophils Absolute: 0.1 10*3/uL (ref 0.0–0.5)
HCT: 40.4 % (ref 38.4–49.9)
HGB: 13.2 g/dL (ref 13.0–17.1)
LYMPH%: 22.1 % (ref 14.0–49.0)
MCH: 26.5 pg — ABNORMAL LOW (ref 27.2–33.4)
MCHC: 32.7 g/dL (ref 32.0–36.0)
MCV: 81.1 fL (ref 79.3–98.0)
MONO#: 0.5 10*3/uL (ref 0.1–0.9)
MONO%: 20.9 % — ABNORMAL HIGH (ref 0.0–14.0)
NEUT#: 1.3 10*3/uL — ABNORMAL LOW (ref 1.5–6.5)
NEUT%: 52.2 % (ref 39.0–75.0)
Platelets: 164 10*3/uL (ref 140–400)
RBC: 4.98 10*6/uL (ref 4.20–5.82)
RDW: 17.6 % — ABNORMAL HIGH (ref 11.0–14.6)
WBC: 2.5 10*3/uL — ABNORMAL LOW (ref 4.0–10.3)
lymph#: 0.6 10*3/uL — ABNORMAL LOW (ref 0.9–3.3)
nRBC: 0 % (ref 0–0)

## 2017-03-21 LAB — LACTATE DEHYDROGENASE: LDH: 285 U/L — ABNORMAL HIGH (ref 125–245)

## 2017-03-21 LAB — TECHNOLOGIST REVIEW

## 2017-03-21 MED ORDER — IOPAMIDOL (ISOVUE-300) INJECTION 61%
INTRAVENOUS | Status: AC
  Filled 2017-03-21: qty 100

## 2017-03-21 MED ORDER — IOPAMIDOL (ISOVUE-300) INJECTION 61%
100.0000 mL | Freq: Once | INTRAVENOUS | Status: AC | PRN
  Administered 2017-03-21: 100 mL via INTRAVENOUS

## 2017-03-24 ENCOUNTER — Ambulatory Visit (HOSPITAL_BASED_OUTPATIENT_CLINIC_OR_DEPARTMENT_OTHER): Payer: No Typology Code available for payment source | Admitting: Internal Medicine

## 2017-03-24 ENCOUNTER — Encounter: Payer: Self-pay | Admitting: Internal Medicine

## 2017-03-24 ENCOUNTER — Telehealth: Payer: Self-pay | Admitting: Internal Medicine

## 2017-03-24 VITALS — BP 120/84 | HR 85 | Temp 97.9°F | Resp 20 | Ht 69.0 in | Wt 195.1 lb

## 2017-03-24 DIAGNOSIS — Z86711 Personal history of pulmonary embolism: Secondary | ICD-10-CM | POA: Diagnosis not present

## 2017-03-24 DIAGNOSIS — Z8572 Personal history of non-Hodgkin lymphomas: Secondary | ICD-10-CM | POA: Diagnosis not present

## 2017-03-24 NOTE — Telephone Encounter (Signed)
Gave patient AVS and calender per 5/17 los. - [facility] to contact patient with Ct schedule.  

## 2017-03-24 NOTE — Progress Notes (Signed)
[place] Telephone:([phone]) [phone]   Fax:([phone]  OFFICE PROGRESS NOTE  Leonard Bentley, Leonard Bentley [id number]  DIAGNOSIS:  1) Stage II, large B-cell non-Hodgkin's lymphoma diagnosed in September 2012 with a scalp lesion. In addition he had mediastinal lymphadenopathy.  2) bilateral pulmonary embolism diagnosed on CT scan of the chest on 12/28/2012.   PRIOR THERAPY:  1) Systemic chemotherapy with CHOP/Rituxan, given every 3 weeks with Neulasta support, status post 6 cycles, last dose was given 12/01/2011 with complete response.  2) Xarelto 20 mg by mouth a Farrier.  CURRENT THERAPY: Observation.  INTERVAL HISTORY: Leonard Bentley 56 y.o. male returns to the clinic today for follow-up visit. The patient is feeling fine today was no specific complaints. He had mitral valve repair under the care of Dr. Roxy Manns in December 2017. He is feeling much better. He denied having any recent weight loss or night sweats. He has no nausea, vomiting, diarrhea or constipation. He has no fever or chills. He has no chest pain, shortness of breath, cough or hemoptysis. He is here today for evaluation with repeat CT scan of the chest, abdomen and pelvis for restaging of his disease.  MEDICAL HISTORY: Past Medical History:  Diagnosis Date  . Acute pulmonary embolism ([place]) 04/11/2016  . Anxiety   . Arthritis   . Atrial tachycardia ([name]) 01/06/2016  . Cancer ([place]) 07/2011   STAGE II NON- HODGKIN LYMPHOMA  . Depression   . Dyspnea   . ED (erectile dysfunction)   . Hypertension   . Lymphoma, non Hodgkin's 09/25/2011  . Mitral regurgitation 01/20/2016  . Mobitz I 10/09/2015  . Murmur 01/06/2016  . Occasional tremors   . PAC (premature atrial contraction) 10/09/2015  . S/P minimally-invasive mitral valve repair 10/27/2016   Complex valvuloplasty including artificial Gore-tex neochord placement x6 and 34 mm Edwards Physio II ring annuloplasty via right mini  thoracotomy approach    ALLERGIES:  has No Known Allergies.  MEDICATIONS:  Current Outpatient Prescriptions  Medication Sig Dispense Refill  . ALPRAZolam (XANAX) 0.25 MG tablet Take 1 tablet (0.25 mg total) by mouth 2 (two) times daily as needed for anxiety. 30 tablet 0  . flecainide (TAMBOCOR) 50 MG tablet Take 1 tablet (50 mg total) by mouth 2 (two) times daily. 60 tablet 3  . metoprolol succinate (TOPROL-XL) 50 MG 24 hr tablet Take 1 tablet (50 mg total) by mouth daily. 30 tablet 5  . neomycin-bacitracin-polymyxin (NEOSPORIN) [phone] ointment Apply topically 4 (four) times daily as needed. 28.3 g 0  . omeprazole (PRILOSEC) 40 MG capsule Take 1 capsule (40 mg total) by mouth daily. 30 capsule 1  . warfarin (COUMADIN) 5 MG tablet Take 1 tablet (5 mg total) by mouth daily at 6 PM. Get your INR checked on 11/29/2016 at the Coumadin clinic and then restart Coumadin as instructed by the Coumadin clinic 1 tablet 0   No current facility-administered medications for this visit.     SURGICAL HISTORY:  Past Surgical History:  Procedure Laterality Date  . CARDIAC CATHETERIZATION N/A 08/13/2016   Procedure: Right/Left Heart Cath and Coronary Angiography;  Surgeon: [name], MD;  Location: [facility];  Service: Cardiovascular;  Laterality: N/A;  . MITRAL VALVE REPAIR Right 10/27/2016   Procedure: MINIMALLY INVASIVE MITRAL VALVE REPAIR (MVR) USING 34 PHYSIO II ANNULOPLASTY RING;  Surgeon: [name], MD;  Location: [facility];  Service: Open Heart Surgery;  Laterality: Right;  .  NO PAST SURGERIES    . TEE WITHOUT CARDIOVERSION N/A 02/11/2016   Procedure: TRANSESOPHAGEAL ECHOCARDIOGRAM (TEE);  Surgeon: [name], MD;  Location: [facility];  Service: Cardiovascular;  Laterality: N/A;  . TEE WITHOUT CARDIOVERSION N/A 10/27/2016   Procedure: TRANSESOPHAGEAL ECHOCARDIOGRAM (TEE);  Surgeon: [name], MD;  Location: [facility];  Service: Open Heart Surgery;  Laterality: N/A;    . TRIGGER FINGER RELEASE Bilateral     REVIEW OF SYSTEMS:  A comprehensive review of systems was negative.   PHYSICAL EXAMINATION: General appearance: alert, cooperative and no distress Head: Normocephalic, without obvious abnormality, atraumatic Neck: no adenopathy Lymph nodes: Cervical, supraclavicular, and axillary nodes normal. Resp: clear to auscultation bilaterally Back: symmetric, no curvature. ROM normal. No CVA tenderness. Cardio: regular rate and rhythm, S1, S2 normal, no murmur, click, rub or gallop GI: soft, non-tender; bowel sounds normal; no masses,  no organomegaly Extremities: extremities normal, atraumatic, no cyanosis or edema  ECOG PERFORMANCE STATUS: 0 - Asymptomatic  Blood pressure 120/84, pulse 85, temperature 97.9 F (36.6 C), temperature source Oral, resp. rate 20, height 5\' 9"  (1.753 m), weight 195 lb 1.6 oz (88.5 kg), SpO2 99 %.  LABORATORY DATA: Lab Results  Component Value Date   WBC 2.5 (L) 03/21/2017   HGB 13.2 03/21/2017   HCT 40.4 03/21/2017   MCV 81.1 03/21/2017   PLT 164 03/21/2017      Chemistry      Component Value Date/Time   NA 138 03/21/2017 1236   K 4.2 03/21/2017 1236   CL 100 01/11/2017 1111   CL 98 12/28/2012 0824   CO2 24 03/21/2017 1236   BUN 10.5 03/21/2017 1236   CREATININE 0.9 03/21/2017 1236      Component Value Date/Time   CALCIUM 9.3 03/21/2017 1236   ALKPHOS 61 03/21/2017 1236   AST 62 (H) 03/21/2017 1236   ALT 41 03/21/2017 1236   BILITOT 0.52 03/21/2017 1236       RADIOGRAPHIC STUDIES: Ct Chest W Contrast  Result Date: 03/21/2017 CLINICAL DATA:  Non-Hodgkin's lymphoma. Chemotherapy complete. Pain around chest incision site. Mitral valve prolapse repair. Short of breath on exertion. Neuropathy. History of pulmonary embolism. EXAM: CT CHEST, ABDOMEN, AND PELVIS WITH CONTRAST TECHNIQUE: Multidetector CT imaging of the chest, abdomen and pelvis was performed following the standard protocol during bolus  administration of intravenous contrast. CONTRAST:  138mL ISOVUE-300 IOPAMIDOL (ISOVUE-300) INJECTION 61% COMPARISON:  Chest CT 11/27/2016. Most recent abdominopelvic CT of 04/09/2016. FINDINGS: CT CHEST FINDINGS Cardiovascular: Tortuous thoracic aorta. Mild cardiomegaly with mitral valve repair. No pericardial effusion. No central pulmonary embolism, on this non-dedicated study. Mediastinum/Nodes: No supraclavicular adenopathy. No axillary adenopathy. Index AP window node measures 1.3 cm on image 26/series 2 versus 1.5 cm on the prior. Precarinal node measures 1.7 cm on image 26/series 2 versus 1.8 cm on the prior. Subcarinal node measures 1.8 cm on image 30/series 2 versus similar. Right hilar node measures 2.4 cm on image 28/series 2 and is not significantly changed. Prevascular nodes measure maximally 11 mm on image 27/series 2 and are similar. Lungs/Pleura: No pleural fluid. Re- demonstration of pulmonary micro nodularity. This appears more distinct today, favored to be due to thinner slice collimation compared to the prior. Slightly upper lobe and peribronchovascular predominant. Musculoskeletal: No acute osseous abnormality. CT ABDOMEN PELVIS FINDINGS Hepatobiliary: Hepatic cysts. Probable mild hepatic steatosis. Normal gallbladder, without biliary ductal dilatation. Pancreas: Pancreatic duct upper normal, similar. No cause identified. No acute pancreatitis. Spleen: Normal in size, without focal abnormality.  Adrenals/Urinary Tract: Normal adrenal glands. Too small to characterize lesions in the left kidney. Normal right kidney, without hydronephrosis. Normal urinary bladder. Stomach/Bowel: Portions of the proximal stomach are underdistended. Normal colon, appendix, and terminal ileum. Normal small bowel. Vascular/Lymphatic: Normal aortic caliber. Tortuous common iliac arteries bilaterally. Ectasia on the right at 17 mm. The left common iliac is aneurysmal at 2.0 cm this is chronic. Both external iliac arteries  are dilated at 1.6 cm. No abdominopelvic adenopathy. Reproductive: Mild prostatomegaly. Calcifications in the left hemiscrotum. Other: No significant free fluid. Musculoskeletal: Degenerative disc disease at the lumbosacral junction. Disc bulge at L3-4 and L4-5. IMPRESSION: 1. Similar to minimal improvement in thoracic adenopathy. 2. Pulmonary nodularity which is more distinct today, primarily felt to be secondary to thinner slice collimation compared to 07/20/16. Etiologies include prior infection or pulmonary sarcoidosis. 3. No abdominopelvic adenopathy to suggest subdiaphragmatic lymphoma. 4. Chronic dilatation of the pelvic vasculature. Electronically Signed   By: Abigail Miyamoto M.D.   On: 03/21/2017 17:01   Ct Abdomen Pelvis W Contrast  Result Date: 03/21/2017 CLINICAL DATA:  Non-Hodgkin's lymphoma. Chemotherapy complete. Pain around chest incision site. Mitral valve prolapse repair. Short of breath on exertion. Neuropathy. History of pulmonary embolism. EXAM: CT CHEST, ABDOMEN, AND PELVIS WITH CONTRAST TECHNIQUE: Multidetector CT imaging of the chest, abdomen and pelvis was performed following the standard protocol during bolus administration of intravenous contrast. CONTRAST:  130mL ISOVUE-300 IOPAMIDOL (ISOVUE-300) INJECTION 61% COMPARISON:  Chest CT 11/27/2016. Most recent abdominopelvic CT of 04/09/2016. FINDINGS: CT CHEST FINDINGS Cardiovascular: Tortuous thoracic aorta. Mild cardiomegaly with mitral valve repair. No pericardial effusion. No central pulmonary embolism, on this non-dedicated study. Mediastinum/Nodes: No supraclavicular adenopathy. No axillary adenopathy. Index AP window node measures 1.3 cm on image 26/series 2 versus 1.5 cm on the prior. Precarinal node measures 1.7 cm on image 26/series 2 versus 1.8 cm on the prior. Subcarinal node measures 1.8 cm on image 30/series 2 versus similar. Right hilar node measures 2.4 cm on image 28/series 2 and is not significantly changed. Prevascular  nodes measure maximally 11 mm on image 27/series 2 and are similar. Lungs/Pleura: No pleural fluid. Re- demonstration of pulmonary micro nodularity. This appears more distinct today, favored to be due to thinner slice collimation compared to the prior. Slightly upper lobe and peribronchovascular predominant. Musculoskeletal: No acute osseous abnormality. CT ABDOMEN PELVIS FINDINGS Hepatobiliary: Hepatic cysts. Probable mild hepatic steatosis. Normal gallbladder, without biliary ductal dilatation. Pancreas: Pancreatic duct upper normal, similar. No cause identified. No acute pancreatitis. Spleen: Normal in size, without focal abnormality. Adrenals/Urinary Tract: Normal adrenal glands. Too small to characterize lesions in the left kidney. Normal right kidney, without hydronephrosis. Normal urinary bladder. Stomach/Bowel: Portions of the proximal stomach are underdistended. Normal colon, appendix, and terminal ileum. Normal small bowel. Vascular/Lymphatic: Normal aortic caliber. Tortuous common iliac arteries bilaterally. Ectasia on the right at 17 mm. The left common iliac is aneurysmal at 2.0 cm this is chronic. Both external iliac arteries are dilated at 1.6 cm. No abdominopelvic adenopathy. Reproductive: Mild prostatomegaly. Calcifications in the left hemiscrotum. Other: No significant free fluid. Musculoskeletal: Degenerative disc disease at the lumbosacral junction. Disc bulge at L3-4 and L4-5. IMPRESSION: 1. Similar to minimal improvement in thoracic adenopathy. 2. Pulmonary nodularity which is more distinct today, primarily felt to be secondary to thinner slice collimation compared to 07/20/16. Etiologies include prior infection or pulmonary sarcoidosis. 3. No abdominopelvic adenopathy to suggest subdiaphragmatic lymphoma. 4. Chronic dilatation of the pelvic vasculature. Electronically Signed   By: Marylyn Ishihara  Jobe Igo M.D.   On: 03/21/2017 17:01   ASSESSMENT AND PLAN:  This is a very pleasant 55 years old  African-American male with history of stage II large B-cell non-Hodgkin lymphoma status post chemotherapy with CHOP/Rituxan with complete response. Has been observation since 2013 and the patient has no evidence for disease recurrence but continues to have persistent mediastinal lymph nodes. His CT scan of the chest, abdomen and pelvis showed no evidence for disease progression. The patient also continues to have mild leukocytopenia and neutropenia likely drug-induced. I recommended for him to continue on observation with repeat CT scan of the chest, abdomen and pelvis in 6 months for reevaluation of his disease. He was advised to call immediately if he has any concerning symptoms in the interval. The patient voices understanding of current disease status and treatment options and is in agreement with the current care plan. All questions were answered. The patient knows to call the clinic with any problems, questions or concerns. We can certainly see the patient much sooner if necessary. I spent 10 minutes counseling the patient face to face. The total time spent in the appointment was 15 minutes.  Disclaimer: This note was dictated with voice recognition software. Similar sounding words can inadvertently be transcribed and may be missed upon review.

## 2017-04-15 ENCOUNTER — Other Ambulatory Visit: Payer: Self-pay | Admitting: Family Medicine

## 2017-04-15 DIAGNOSIS — F418 Other specified anxiety disorders: Secondary | ICD-10-CM

## 2017-04-15 NOTE — Telephone Encounter (Signed)
Called in xanax per [employer]

## 2017-04-15 NOTE — Telephone Encounter (Signed)
Is this okay to refill? 

## 2017-04-15 NOTE — Telephone Encounter (Signed)
ok 

## 2017-04-19 ENCOUNTER — Ambulatory Visit (INDEPENDENT_AMBULATORY_CARE_PROVIDER_SITE_OTHER): Payer: No Typology Code available for payment source | Admitting: Cardiovascular Disease

## 2017-04-19 ENCOUNTER — Ambulatory Visit (INDEPENDENT_AMBULATORY_CARE_PROVIDER_SITE_OTHER)
Payer: No Typology Code available for payment source | Admitting: Pharmacist Clinician (PhC)/ Clinical Pharmacy Specialist

## 2017-04-19 ENCOUNTER — Encounter: Payer: Self-pay | Admitting: Cardiovascular Disease

## 2017-04-19 VITALS — BP 124/87 | HR 73 | Ht 69.0 in | Wt 192.0 lb

## 2017-04-19 DIAGNOSIS — Z9889 Other specified postprocedural states: Secondary | ICD-10-CM

## 2017-04-19 DIAGNOSIS — I4719 Other supraventricular tachycardia: Secondary | ICD-10-CM

## 2017-04-19 DIAGNOSIS — I5042 Chronic combined systolic (congestive) and diastolic (congestive) heart failure: Secondary | ICD-10-CM | POA: Diagnosis not present

## 2017-04-19 DIAGNOSIS — Z86711 Personal history of pulmonary embolism: Secondary | ICD-10-CM

## 2017-04-19 DIAGNOSIS — I471 Supraventricular tachycardia: Secondary | ICD-10-CM | POA: Diagnosis not present

## 2017-04-19 DIAGNOSIS — Z7901 Long term (current) use of anticoagulants: Secondary | ICD-10-CM

## 2017-04-19 LAB — POCT INR: INR: 2.1

## 2017-04-19 NOTE — Progress Notes (Signed)
Cardiology Office Note   Date:  04/20/2017   ID:  Leonard Bentley, DOB Nov 03, 1961, MRN [id number]  PCP:  [name], MD  Cardiologist:   [name], MD   Chief Complaint  Patient presents with  . Follow-up    3 MONTHS;      History of Present Illness:  Leonard Bentley is a 56 y.o. male with severe mitral regurgitation s/p mitral valve repair, chronic systolic heart failure LVEF 45-50%, alcohol abuse, tobacco abuse, recurrent PE, and non-Hodgkin's lymphoma s/p chemotherapy who presents for follow up.  Mr. Leonard Bentley was initially evaluated 10/2015 after his PCP, Dr. Jill Bentley, noted that he had Mobitz I.  He was referred to cardiology, where he reported that he wasn't able to run as long as he used to.  He was referred for exercise Cardiolite that revealed LVEF 52% and no ischemia. On 01/20/16 he had an echo that was revealed LVEF 60-65% with moderate LVH and posterior mitral valve leaflet prolapse. There was moderate to severe mitral regurgitation.  He was referred for transesophageal echocardiography which confirmed moderate to severe mitral regurgitation. The left atrium was massively dilated but there was no left ventricle dilation.  Pulmonary pressures were normal.   He was referred to Dr. [name] for consideration of mitral valve replacement/repair.  He had a chest CT on 04/09/16 for preoperative assessment and was noted to have a pulmonary embolism in the right lower lobe.  Mr. Leonard Bentley had a PE several years ago in the setting of testosterone therapy.  He was started on Xarelto anticoagulation indefinitely.  He had a follow up CT 07/20/16 that showed no acute thrombus and resolution of most of the prior PE.  There was a minimal, chronic linear thrombus in the RLL.  On 10/27/16 he underwent minimally invasive mitral valve repair with a 34 mm Edwards ring.  Echo 11/28/16 revealed LVEF 45-50% with diffuse hypokinesis and a mildly dilated ascending aorta.  Since the surgery Mr. Leonard Bentley was seen in the ED  11/19/16 with an episode of SVT.  He was noted ot have sinus tachycardia vs atrial tachycardia with rates in the 130s that improved with lorazepam.  There was no evidence of heart failure and metoprolol was increased to 50 mg daily.  He followed up with [name], PA-C on 01/07/17 and was noted to be in atrial tachycardia with a heart rate of 141 bpm.  He notes that his breathing has improved.  He is able to run two miles, though at a much slower pace than prior to surgery.  He also does 180 push ups daily.  He hasn't experienced any heart racing.  He has occasional sharp pain at his incision site.  This is worse at rest and never with exertion.  He denies chest pain, shortness of breath, lower extremity edema, orthopnea or PND.  He continues to struggle with anxiety and has not discussed this with his PCP.   Past Medical History:  Diagnosis Date  . Acute pulmonary embolism ([place]) 04/11/2016  . Anxiety   . Arthritis   . Atrial tachycardia ([place]) 01/06/2016  . Cancer ([place]) 07/2011   STAGE II NON- HODGKIN LYMPHOMA  . Depression   . Dyspnea   . ED (erectile dysfunction)   . Hypertension   . Lymphoma, non Hodgkin's 09/25/2011  . Mitral regurgitation 01/20/2016  . Mobitz I 10/09/2015  . Murmur 01/06/2016  . Occasional tremors   . PAC (premature atrial contraction) 10/09/2015  . S/P minimally-invasive  mitral valve repair 10/27/2016   Complex valvuloplasty including artificial Gore-tex neochord placement x6 and 34 mm Edwards Physio II ring annuloplasty via right mini thoracotomy approach    Past Surgical History:  Procedure Laterality Date  . CARDIAC CATHETERIZATION N/A 08/13/2016   Procedure: Right/Left Heart Cath and Coronary Angiography;  Surgeon: [name], MD;  Location: [facility];  Service: Cardiovascular;  Laterality: N/A;  . MITRAL VALVE REPAIR Right 10/27/2016   Procedure: MINIMALLY INVASIVE MITRAL VALVE REPAIR (MVR) USING 34 PHYSIO II ANNULOPLASTY RING;  Surgeon: [name], MD;  Location: [facility];  Service: Open Heart Surgery;  Laterality: Right;  . NO PAST SURGERIES    . TEE WITHOUT CARDIOVERSION N/A 02/11/2016   Procedure: TRANSESOPHAGEAL ECHOCARDIOGRAM (TEE);  Surgeon: [name], MD;  Location: [facility];  Service: Cardiovascular;  Laterality: N/A;  . TEE WITHOUT CARDIOVERSION N/A 10/27/2016   Procedure: TRANSESOPHAGEAL ECHOCARDIOGRAM (TEE);  Surgeon: [name], MD;  Location: [facility];  Service: Open Heart Surgery;  Laterality: N/A;  . TRIGGER FINGER RELEASE Bilateral      Current Outpatient Prescriptions  Medication Sig Dispense Refill  . ALPRAZolam (XANAX) 0.25 MG tablet TAKE 1 TABLET BY MOUTH TWICE DAILY AS NEEDED FOR ANXIETY 30 tablet 0  . flecainide (TAMBOCOR) 50 MG tablet Take 1 tablet (50 mg total) by mouth 2 (two) times daily. 60 tablet 3  . metoprolol succinate (TOPROL-XL) 50 MG 24 hr tablet Take 1 tablet (50 mg total) by mouth daily. 30 tablet 5  . omeprazole (PRILOSEC) 40 MG capsule Take 1 capsule (40 mg total) by mouth daily. 30 capsule 1  . warfarin (COUMADIN) 5 MG tablet Take 1 tablet (5 mg total) by mouth daily at 6 PM. Get your INR checked on 11/29/2016 at the Coumadin clinic and then restart Coumadin as instructed by the Coumadin clinic 1 tablet 0   No current facility-administered medications for this visit.     Allergies:   Patient has no known allergies.    Social History:  The patient  reports that he quit smoking about 3 years ago. His smoking use included Cigarettes and E-cigarettes. He has a 32.00 pack-year smoking history. He uses smokeless tobacco. He reports that he drinks alcohol. He reports that he does not use drugs.   Family History:  The patient's family history includes Cancer in his father and mother; Hypertension in his maternal grandmother; Stroke in his maternal aunt.    ROS:  Please see the history of present illness.  Otherwise, review of systems are positive for anxiety.   All other systems are  reviewed and negative.    PHYSICAL EXAM: VS:  BP 124/87   Pulse 73   Ht 5\' 9"  (1.753 m)   Wt 87.1 kg (192 lb)   BMI 28.35 kg/m  , BMI Body mass index is 28.35 kg/m. GENERAL:  Well appearing.  No acute distress. HEENT:  Pupils equal round and reactive, fundi not visualized, oral mucosa unremarkable NECK:  No jugular venous distention, waveform within normal limits, carotid upstroke brisk and symmetric, no bruits LUNGS:  Clear to auscultation bilaterally.  No crackles, wheezes or rhonchi HEART:  RRR.  PMI not displaced or sustained,S1 and S2 within normal limits, + S3, no S4, no clicks, no rubs, II/VI holosystolic murmur at the apex ABD:  Flat, positive bowel sounds normal in frequency in pitch, no bruits, no rebound, no guarding, no midline pulsatile mass, no hepatomegaly, no splenomegaly EXT:  2 plus pulses throughout,  no edema, no cyanosis no clubbing SKIN:  No rashes no nodules NEURO:  Cranial nerves II through XII grossly intact, motor grossly intact throughout PSYCH:  Cognitively intact, oriented to person place and time   EKG:  EKG is ordered today. 04/09/16: Sinus arrhythmia. Rate 92 bpm. First degree AV block. 09/15/15: sinus rhythm rate 87 bpm. PACs. Second degree heart block, Mobitz I.  LAFB 01/11/17: Sinus rhythm.  Rate 74 bpm.  PACs.  LAFB.  Non-specific T wave abnormalities  Exercise Cardiolite 10/15/15:  Nuclear stress EF: 52%.  There was no ST segment deviation noted during stress.  No T wave inversion was noted during stress.  The study is normal.  This is a low risk study.  Low risk stress nuclear study with normal perfusion and normal left ventricular regional systolic function. Borderline global LVEF 52%.  Echo 01/20/16: Study Conclusions  - Left ventricle: The cavity size was normal. Wall thickness was  increased in a pattern of moderate LVH. Systolic function was  normal. The estimated ejection fraction was in the range of 60%  to 65%. Wall motion was  normal; there were no regional wall  motion abnormalities. The study is not technically sufficient to  allow evaluation of LV diastolic function. - Aortic valve: Sclerosis without stenosis. There was trivial  regurgitation. - Mitral valve: Mildly thickened leaflets with predominate prolapse  of the posterior leaflet and anteriorly directed mitral  regurgitation. There was moderate to severe eccentric  regurgitation directed at the atrial septum. - Left atrium: Massively dilated at 85 ml/m2. - Tricuspid valve: There was mild regurgitation. - Pulmonary arteries: PA peak pressure: 27 mm Hg (S). - Inferior vena cava: The vessel was normal in size. The  respirophasic diameter changes were in the normal range (>= 50%),  consistent with normal central venous pressure.  Impressions:  - LVEF 60-65%, moderate LVH, trivial AI, moderate to more likely  severe eccentric MR directed anteriorly and to the interatrial  septum, posterior mitral valve leaflet prolpase, massive LAE,  mild TR, RVSP 27 mmHg, normal IVC.  Echo 1/21/118:  - Left ventricle: The cavity size was normal. There was mild   concentric hypertrophy. Systolic function was mildly reduced. The   estimated ejection fraction was in the range of 45% to 50%.   Diffuse hypokinesis. The study is not technically sufficient to   allow evaluation of LV diastolic function. - Aorta: Aortic root dimension: 38 mm (ED). - Ascending aorta: The ascending aorta was mildly dilated. - Left atrium: The atrium was mildly dilated.  Recent Labs: 11/19/2016: B Natriuretic Peptide 71.3 11/28/2016: TSH 0.586 01/11/2017: Magnesium 1.6 03/21/2017: ALT 41; BUN 10.5; Creatinine 0.9; HGB 13.2; Platelets 164; Potassium 4.2; Sodium 138    Lipid Panel    Component Value Date/Time   CHOL 239 (H) 04/03/2016 0323   CHOL 238 (H) 08/18/2015 0825   TRIG 63 04/03/2016 0323   HDL 126 04/03/2016 0323   HDL 124 08/18/2015 0825   CHOLHDL 1.9 04/03/2016  0323   VLDL 13 04/03/2016 0323   LDLCALC 100 (H) 04/03/2016 0323   LDLCALC 94 08/18/2015 0825      Wt Readings from Last 3 Encounters:  04/19/17 87.1 kg (192 lb)  03/24/17 88.5 kg (195 lb 1.6 oz)  02/24/17 86.6 kg (191 lb)      ASSESSMENT AND PLAN:  # PAT, PACs: Controlled on flecainide. Continue metoprolol and flecainide.   # Chronic systolic and diastolic heart failure:  LVEF was reduced to 45-50% after mitral  valve surgery. This is likely result of the mitral regurgitation.  He is clinically stable and has no evidence of heart failure on examination this time.   # Pulmonary Embolism: Mr. Witman had a recurrent PE and will therefore require lifelong anticoagulation.    # Mitral Regurgitation s/p repair: Mr. Revelo underwent successful minimally invasive mitral valve repair with Dr. Roxy Manns on 10/27/16.  Postoperative echo showed only trivial mitral regurgitation.    Current medicines are reviewed at length with the patient today.  The patient does not have concerns regarding medicines.  The following changes have been made:  none  Labs/ tests ordered today include:   No orders of the defined types were placed in this encounter.    Disposition:   FU in 6 months.   Signed, Lain Tetterton C. Oval Linsey, MD, [facility]  04/20/2017 10:37 PM    [place] [facility]

## 2017-04-19 NOTE — Patient Instructions (Signed)
Medication Instructions:  Your physician recommends that you continue on your current medications as directed. Please refer to the Current Medication list given to you today.  Labwork: NONE  Testing/Procedures: NONE  Follow-Up: Your physician wants you to follow-up in: 6 MONTH OV  You will receive a reminder letter in the mail two months in advance. If you don't receive a letter, please call our office to schedule the follow-up appointment.  If you need a refill on your cardiac medications before your next appointment, please call your pharmacy.  

## 2017-04-26 ENCOUNTER — Ambulatory Visit (INDEPENDENT_AMBULATORY_CARE_PROVIDER_SITE_OTHER): Payer: PRIVATE HEALTH INSURANCE | Admitting: Family Medicine

## 2017-04-26 ENCOUNTER — Encounter: Payer: Self-pay | Admitting: Family Medicine

## 2017-04-26 VITALS — BP 122/82 | HR 73 | Wt 192.4 lb

## 2017-04-26 DIAGNOSIS — M549 Dorsalgia, unspecified: Secondary | ICD-10-CM

## 2017-04-26 NOTE — Progress Notes (Signed)
   Subjective:    Patient ID: Leonard Bentley, male    DOB: [birth year], 56 y.o.   MRN: [id number]  HPI He states that Friday after work he experienced some left upper shoulder and neck discomfort lasted roughly 3 hours. This occurred again on Monday any noted some numbness in the neck and upper back area. Today he is feeling better.   Review of Systems     Objective:   Physical Exam Alert and in no distress. Full motion of the neck without pain. No palpable tenderness to his neck but a trigger point was noted in the mid trapezius area. Normal motor, sensory and DTRs.       Assessment & Plan:  Upper back pain on left side  I reassured him that I found nothing of any significance specifically no evidence of a pinched nerve. Recommend stretching exercises for his neck. He is to return here as needed.

## 2017-06-03 ENCOUNTER — Other Ambulatory Visit: Payer: Self-pay | Admitting: Medical

## 2017-06-03 ENCOUNTER — Other Ambulatory Visit: Payer: Self-pay | Admitting: Physician Assistant

## 2017-06-03 ENCOUNTER — Telehealth: Payer: Self-pay | Admitting: Family Medicine

## 2017-06-03 MED ORDER — WARFARIN SODIUM 5 MG PO TABS: 5.0000 mg | ORAL_TABLET | Freq: Every day | ORAL | 0 refills | Status: DC

## 2017-06-03 NOTE — Telephone Encounter (Signed)
done

## 2017-06-03 NOTE — Telephone Encounter (Signed)
Pt out of his Warfarin 5 mg #90 & needs refilled today to [facility]

## 2017-06-03 NOTE — Telephone Encounter (Signed)
Left message for pt

## 2017-06-10 ENCOUNTER — Telehealth: Payer: Self-pay | Admitting: Medical

## 2017-06-10 ENCOUNTER — Other Ambulatory Visit: Payer: Self-pay | Admitting: Cardiovascular Disease

## 2017-06-10 ENCOUNTER — Other Ambulatory Visit: Payer: Self-pay | Admitting: Physician Assistant

## 2017-06-10 MED ORDER — FLECAINIDE ACETATE 50 MG PO TABS: 50.0000 mg | ORAL_TABLET | Freq: Two times a day (BID) | ORAL | 5 refills | Status: DC

## 2017-06-10 NOTE — Telephone Encounter (Signed)
It appears cardiology refilled this today per chart record

## 2017-06-10 NOTE — Telephone Encounter (Signed)
Rx(s) sent to pharmacy electronically.  

## 2017-06-10 NOTE — Telephone Encounter (Signed)
New message     *STAT* If patient is at the pharmacy, call can be transferred to refill team.   1. Which medications need to be refilled? (please list name of each medication and dose if known) Flecainide 50 mg  2. Which pharmacy/location (including street and city if local pharmacy) is medication to be sent to? [facility]   3. Do they need a [address] [place] or [address] [place] supply? [place]

## 2017-06-10 NOTE — Telephone Encounter (Signed)
Pt called and requested a refill on Tambocor. Pt was informed that this medication was filled by cardio. Pt states he still wanted me to send message as he always has trouble getting in touch with them. Pt was advised to contact them in the meantime.   Pt can be reached at [phone] and uses [facility] on [address].

## 2017-07-26 ENCOUNTER — Encounter: Payer: Self-pay | Admitting: Family Medicine

## 2017-07-26 ENCOUNTER — Ambulatory Visit (INDEPENDENT_AMBULATORY_CARE_PROVIDER_SITE_OTHER): Payer: PRIVATE HEALTH INSURANCE | Admitting: Family Medicine

## 2017-07-26 VITALS — BP 110/70 | HR 80 | Resp 18 | Wt 193.2 lb

## 2017-07-26 DIAGNOSIS — R0982 Postnasal drip: Secondary | ICD-10-CM | POA: Diagnosis not present

## 2017-07-26 DIAGNOSIS — T148XXA Other injury of unspecified body region, initial encounter: Secondary | ICD-10-CM

## 2017-07-26 NOTE — Progress Notes (Signed)
   Subjective:    Patient ID: Leonard Bentley, male    DOB: [birth year], 56 y.o.   MRN: [id number]  HPI Approximately one month ago while stretching he noted some pain in the right medial thigh area. He didn't pay much attention to it after that but yesterday he noted swelling in that same area. Also over the last month he complains of some slight chest congestion, slight cough but no sore throat, earache, fever, chills. He does not smoke but is gaping.   Review of Systems     Objective:   Physical Exam Alert and in no distress. Tympanic membranes and canals are normal. Pharyngeal area is normal. Neck is supple without adenopathy or thyromegaly. Cardiac exam shows a regular sinus rhythm without murmurs or gallops. Lungs are clear to auscultation. Exam of the right medial thigh does show a 9 x 11 cm firm slightly tender smooth round movable lesion. No inguinal adenopathy. Penis and testes normal.       Assessment & Plan:  Hematoma  PND (post-nasal drip) I explained that I think is is having difficulty with PND causing the slight coughing. I then discussed the fact that I think this is a hematoma from abductor strain. Recommend heat for 20 minutes 3 times per [place] and recheck this in 2 weeks. If need be we can get an ultrasound on this.

## 2017-08-09 ENCOUNTER — Encounter: Payer: Self-pay | Admitting: Family Medicine

## 2017-08-09 ENCOUNTER — Ambulatory Visit (INDEPENDENT_AMBULATORY_CARE_PROVIDER_SITE_OTHER): Payer: PRIVATE HEALTH INSURANCE | Admitting: Family Medicine

## 2017-08-09 VITALS — BP 108/68 | HR 114 | Wt 195.0 lb

## 2017-08-09 DIAGNOSIS — T148XXA Other injury of unspecified body region, initial encounter: Secondary | ICD-10-CM | POA: Diagnosis not present

## 2017-08-09 NOTE — Patient Instructions (Signed)
Call Dr. Katy Fitch [phone]

## 2017-08-09 NOTE — Progress Notes (Signed)
   Subjective:    Patient ID: Leonard Bentley, male    DOB: Jan 12, 1961, 56 y.o.   MRN: [id number]  HPI He is here for a recheck. He has not been using heat on the area but does note that the medial right thigh is much smaller than on his last visit.   Review of Systems     Objective:   Physical Exam Alert and in no distress. Exam of the area shows that it has diminished by at least one half in size. It is now roughly 4 x 5 cm.       Assessment & Plan:  Hematoma  I told him that it is definitely shrinking but want him to use heat on this several times a [place] and it should slowly go away and cause no further trouble.

## 2017-08-29 ENCOUNTER — Other Ambulatory Visit: Payer: Self-pay | Admitting: Medical

## 2017-08-29 ENCOUNTER — Telehealth: Payer: Self-pay | Admitting: Family Medicine

## 2017-08-29 ENCOUNTER — Other Ambulatory Visit: Payer: Self-pay | Admitting: Cardiovascular Disease

## 2017-08-29 NOTE — Telephone Encounter (Signed)
Refill Request.  

## 2017-08-29 NOTE — Telephone Encounter (Signed)
REFILL 

## 2017-08-29 NOTE — Telephone Encounter (Signed)
Recv'd refill request for Warfarin 5mg  #90 to [facility] (tried to fax back several times today but their fax wasn't working)

## 2017-08-30 MED ORDER — WARFARIN SODIUM 5 MG PO TABS: 5.0000 mg | ORAL_TABLET | Freq: Every day | ORAL | 0 refills | Status: DC

## 2017-08-31 ENCOUNTER — Ambulatory Visit (INDEPENDENT_AMBULATORY_CARE_PROVIDER_SITE_OTHER)
Payer: No Typology Code available for payment source | Admitting: Pharmacist Clinician (PhC)/ Clinical Pharmacy Specialist

## 2017-08-31 DIAGNOSIS — Z86711 Personal history of pulmonary embolism: Secondary | ICD-10-CM | POA: Diagnosis not present

## 2017-08-31 DIAGNOSIS — Z7901 Long term (current) use of anticoagulants: Secondary | ICD-10-CM

## 2017-08-31 DIAGNOSIS — Z9889 Other specified postprocedural states: Secondary | ICD-10-CM | POA: Diagnosis not present

## 2017-08-31 LAB — POCT INR: INR: 2.9

## 2017-09-21 ENCOUNTER — Other Ambulatory Visit (HOSPITAL_BASED_OUTPATIENT_CLINIC_OR_DEPARTMENT_OTHER): Payer: No Typology Code available for payment source

## 2017-09-21 ENCOUNTER — Encounter (HOSPITAL_COMMUNITY): Payer: Self-pay

## 2017-09-21 ENCOUNTER — Ambulatory Visit (HOSPITAL_COMMUNITY)
Admission: RE | Admit: 2017-09-21 | Discharge: 2017-09-21 | Disposition: A | Discharge status: Home / Self Care | Payer: No Typology Code available for payment source | Source: Ambulatory Visit | Attending: Internal Medicine | Admitting: Internal Medicine

## 2017-09-21 DIAGNOSIS — R918 Other nonspecific abnormal finding of lung field: Secondary | ICD-10-CM | POA: Diagnosis not present

## 2017-09-21 DIAGNOSIS — K7689 Other specified diseases of liver: Secondary | ICD-10-CM | POA: Diagnosis not present

## 2017-09-21 DIAGNOSIS — Z8572 Personal history of non-Hodgkin lymphomas: Secondary | ICD-10-CM | POA: Diagnosis not present

## 2017-09-21 DIAGNOSIS — R590 Localized enlarged lymph nodes: Secondary | ICD-10-CM | POA: Diagnosis not present

## 2017-09-21 LAB — COMPREHENSIVE METABOLIC PANEL
ALT: 37 U/L (ref 0–55)
AST: 58 U/L — ABNORMAL HIGH (ref 5–34)
Albumin: 3.7 g/dL (ref 3.5–5.0)
Alkaline Phosphatase: 63 U/L (ref 40–150)
Anion Gap: 11 mEq/L (ref 3–11)
BUN: 18.1 mg/dL (ref 7.0–26.0)
CO2: 27 mEq/L (ref 22–29)
Calcium: 9.8 mg/dL (ref 8.4–10.4)
Chloride: 102 mEq/L (ref 98–109)
Creatinine: 1 mg/dL (ref 0.7–1.3)
EGFR: >60 mL/min/{1.73_m2} (ref >60)
Glucose: 86 mg/dl (ref 70–140)
Potassium: 4.5 mEq/L (ref 3.5–5.1)
Sodium: 140 mEq/L (ref 136–145)
Total Bilirubin: 0.62 mg/dL (ref 0.20–1.20)
Total Protein: 8 g/dL (ref 6.4–8.3)

## 2017-09-21 LAB — CBC WITH DIFFERENTIAL/PLATELET
BASO%: 0.6 % (ref 0.0–2.0)
Basophils Absolute: 0 10*3/uL (ref 0.0–0.1)
EOS%: 6.4 % (ref 0.0–7.0)
Eosinophils Absolute: 0.2 10*3/uL (ref 0.0–0.5)
HCT: 40.2 % (ref 38.4–49.9)
HGB: 12.8 g/dL — ABNORMAL LOW (ref 13.0–17.1)
LYMPH%: 17.5 % (ref 14.0–49.0)
MCH: 25.9 pg — ABNORMAL LOW (ref 27.2–33.4)
MCHC: 31.8 g/dL — ABNORMAL LOW (ref 32.0–36.0)
MCV: 81.4 fL (ref 79.3–98.0)
MONO#: 0.5 10*3/uL (ref 0.1–0.9)
MONO%: 15.6 % — ABNORMAL HIGH (ref 0.0–14.0)
NEUT#: 1.9 10*3/uL (ref 1.5–6.5)
NEUT%: 59.9 % (ref 39.0–75.0)
Platelets: 221 10*3/uL (ref 140–400)
RBC: 4.94 10*6/uL (ref 4.20–5.82)
RDW: 15.2 % — ABNORMAL HIGH (ref 11.0–14.6)
WBC: 3.1 10*3/uL — ABNORMAL LOW (ref 4.0–10.3)
lymph#: 0.6 10*3/uL — ABNORMAL LOW (ref 0.9–3.3)

## 2017-09-21 LAB — LACTATE DEHYDROGENASE: LDH: 289 U/L — ABNORMAL HIGH (ref 125–245)

## 2017-09-21 MED ORDER — IOPAMIDOL (ISOVUE-300) INJECTION 61%
INTRAVENOUS | Status: AC
  Administered 2017-09-21: 100 mL via INTRAVENOUS
  Filled 2017-09-21: qty 100

## 2017-09-21 MED ORDER — IOPAMIDOL (ISOVUE-300) INJECTION 61%
100.0000 mL | Freq: Once | INTRAVENOUS | Status: AC | PRN
  Administered 2017-09-21: 100 mL via INTRAVENOUS

## 2017-09-26 ENCOUNTER — Ambulatory Visit: Payer: Self-pay | Admitting: Internal Medicine

## 2017-10-03 ENCOUNTER — Ambulatory Visit (INDEPENDENT_AMBULATORY_CARE_PROVIDER_SITE_OTHER)
Payer: No Typology Code available for payment source | Admitting: Pharmacist Clinician (PhC)/ Clinical Pharmacy Specialist

## 2017-10-03 DIAGNOSIS — I2699 Other pulmonary embolism without acute cor pulmonale: Secondary | ICD-10-CM

## 2017-10-03 DIAGNOSIS — Z7901 Long term (current) use of anticoagulants: Secondary | ICD-10-CM | POA: Diagnosis not present

## 2017-10-03 DIAGNOSIS — Z9889 Other specified postprocedural states: Secondary | ICD-10-CM

## 2017-10-03 DIAGNOSIS — Z86711 Personal history of pulmonary embolism: Secondary | ICD-10-CM | POA: Diagnosis not present

## 2017-10-03 LAB — POCT INR: INR: 5.3

## 2017-10-06 ENCOUNTER — Encounter: Payer: Self-pay | Admitting: Internal Medicine

## 2017-10-06 ENCOUNTER — Telehealth: Payer: Self-pay | Admitting: Internal Medicine

## 2017-10-06 ENCOUNTER — Ambulatory Visit (HOSPITAL_BASED_OUTPATIENT_CLINIC_OR_DEPARTMENT_OTHER): Payer: No Typology Code available for payment source | Admitting: Internal Medicine

## 2017-10-06 VITALS — BP 133/95 | HR 100 | Temp 98.2°F | Resp 18 | Ht 69.0 in | Wt 200.4 lb

## 2017-10-06 DIAGNOSIS — Z8572 Personal history of non-Hodgkin lymphomas: Secondary | ICD-10-CM | POA: Diagnosis not present

## 2017-10-06 DIAGNOSIS — Z86711 Personal history of pulmonary embolism: Secondary | ICD-10-CM

## 2017-10-06 DIAGNOSIS — T451X5A Adverse effect of antineoplastic and immunosuppressive drugs, initial encounter: Secondary | ICD-10-CM

## 2017-10-06 DIAGNOSIS — Z7901 Long term (current) use of anticoagulants: Secondary | ICD-10-CM

## 2017-10-06 DIAGNOSIS — G620 Drug-induced polyneuropathy: Secondary | ICD-10-CM

## 2017-10-06 NOTE — Telephone Encounter (Signed)
Gave avs and calendar for November 2019 °

## 2017-10-06 NOTE — Progress Notes (Signed)
[facility] [phone]   [phone]  OFFICE PROGRESS NOTE  Leonard Bentley, [place] [address]  DIAGNOSIS:  1) Stage II, large B-cell non-Hodgkin's lymphoma diagnosed in September 2012 with a scalp lesion. In addition he had mediastinal lymphadenopathy.  2) bilateral pulmonary embolism diagnosed on CT scan of the chest on 12/28/2012.   PRIOR THERAPY:  1) Systemic chemotherapy with CHOP/Rituxan, given every 3 weeks with Neulasta support, status post 6 cycles, last dose was given 12/01/2011 with complete response.  2) Xarelto 20 mg by mouth a Bednarz.  CURRENT THERAPY: Observation.  INTERVAL HISTORY: Leonard Bentley 56 y.o. male returns to the clinic today for annual follow-up visit.  The patient is feeling fine with no specific complaints.  He denied having any chest pain, shortness of breath except with exertion, cough or hemoptysis.  He denied having any fever or chills.  He has no nausea, vomiting, diarrhea or constipation.  He has no significant weight loss or night sweats.  He has no palpable lymphadenopathy.  He is here today for evaluation with repeat CT scan of the chest, abdomen and pelvis for restaging of his disease.  MEDICAL HISTORY: Past Medical History:  Diagnosis Date  . Acute pulmonary embolism ([facility]) 04/11/2016  . Anxiety   . Arthritis   . Atrial tachycardia ([facility]) 01/06/2016  . Cancer ([facility]) 07/2011   STAGE II NON- HODGKIN LYMPHOMA  . Depression   . Dyspnea   . ED (erectile dysfunction)   . Hypertension   . Lymphoma, non Hodgkin's 09/25/2011  . Mitral regurgitation 01/20/2016  . Mobitz I 10/09/2015  . Murmur 01/06/2016  . Occasional tremors   . PAC (premature atrial contraction) 10/09/2015  . S/P minimally-invasive mitral valve repair 10/27/2016   Complex valvuloplasty including artificial Gore-tex neochord placement x6 and 34 mm Edwards Physio II ring annuloplasty via right mini thoracotomy approach     ALLERGIES:  has No Known Allergies.  MEDICATIONS:  Current Outpatient Medications  Medication Sig Dispense Refill  . metoprolol succinate (TOPROL-XL) 50 MG 24 hr tablet TAKE 1 TABLET BY MOUTH ONCE DAILY (NEW  DOSE) 90 tablet 2  . warfarin (COUMADIN) 5 MG tablet Take 1 tablet (5 mg total) by mouth daily at 6 PM. 90 tablet 0  . ALPRAZolam (XANAX) 0.25 MG tablet TAKE 1 TABLET BY MOUTH TWICE DAILY AS NEEDED FOR ANXIETY (Patient not taking: Reported on 10/06/2017) 30 tablet 0  . LORATADINE PO Take by mouth.    Marland Kitchen omeprazole (PRILOSEC) 40 MG capsule Take 1 capsule (40 mg total) by mouth daily. (Patient not taking: Reported on 10/06/2017) 30 capsule 1   No current facility-administered medications for this visit.     SURGICAL HISTORY:  Past Surgical History:  Procedure Laterality Date  . CARDIAC CATHETERIZATION N/A 08/13/2016   Procedure: Right/Left Heart Cath and Coronary Angiography;  Surgeon: [name], MD;  Location: [facility];  Service: Cardiovascular;  Laterality: N/A;  . MITRAL VALVE REPAIR Right 10/27/2016   Procedure: MINIMALLY INVASIVE MITRAL VALVE REPAIR (MVR) USING 34 PHYSIO II ANNULOPLASTY RING;  Surgeon: [name], MD;  Location: [facility];  Service: Open Heart Surgery;  Laterality: Right;  . NO PAST SURGERIES    . TEE WITHOUT CARDIOVERSION N/A 02/11/2016   Procedure: TRANSESOPHAGEAL ECHOCARDIOGRAM (TEE);  Surgeon: [name], MD;  Location: [facility];  Service: Cardiovascular;  Laterality: N/A;  . TEE WITHOUT CARDIOVERSION N/A 10/27/2016   Procedure: TRANSESOPHAGEAL  ECHOCARDIOGRAM (TEE);  Surgeon: [name], MD;  Location: [facility];  Service: Open Heart Surgery;  Laterality: N/A;  . TRIGGER FINGER RELEASE Bilateral     REVIEW OF SYSTEMS:  A comprehensive review of systems was negative.   PHYSICAL EXAMINATION: General appearance: alert, cooperative and no distress Head: Normocephalic, without obvious abnormality, atraumatic Neck: no  adenopathy Lymph nodes: Cervical, supraclavicular, and axillary nodes normal. Resp: clear to auscultation bilaterally Back: symmetric, no curvature. ROM normal. No CVA tenderness. Cardio: regular rate and rhythm, S1, S2 normal, no murmur, click, rub or gallop GI: soft, non-tender; bowel sounds normal; no masses,  no organomegaly Extremities: extremities normal, atraumatic, no cyanosis or edema  ECOG PERFORMANCE STATUS: 0 - Asymptomatic  Blood pressure (!) 133/95, pulse 100, temperature 98.2 F (36.8 C), temperature source Oral, resp. rate 18, height 5\' 9"  (1.753 m), weight 200 lb 6.4 oz (90.9 kg), SpO2 98 %.  LABORATORY DATA: Lab Results  Component Value Date   WBC 3.1 (L) 09/21/2017   HGB 12.8 (L) 09/21/2017   HCT 40.2 09/21/2017   MCV 81.4 09/21/2017   PLT 221 09/21/2017      Chemistry      Component Value Date/Time   NA 140 09/21/2017 0825   K 4.5 09/21/2017 0825   CL 100 01/11/2017 1111   CL 98 12/28/2012 0824   CO2 27 09/21/2017 0825   BUN 18.1 09/21/2017 0825   CREATININE 1.0 09/21/2017 0825      Component Value Date/Time   CALCIUM 9.8 09/21/2017 0825   ALKPHOS 63 09/21/2017 0825   AST 58 (H) 09/21/2017 0825   ALT 37 09/21/2017 0825   BILITOT 0.62 09/21/2017 0825       RADIOGRAPHIC STUDIES: Ct Chest W Contrast  Result Date: 09/21/2017 CLINICAL DATA:  Non-Hodgkin's lymphoma diagnosed 2011, chemotherapy complete EXAM: CT CHEST, ABDOMEN, AND PELVIS WITH CONTRAST TECHNIQUE: Multidetector CT imaging of the chest, abdomen and pelvis was performed following the standard protocol during bolus administration of intravenous contrast. CONTRAST:  100 cc Isovue COMPARISON:  CT 03/21/2017 FINDINGS: CT CHEST FINDINGS Cardiovascular: No significant vascular findings. Normal heart size. No pericardial effusion. Mediastinum/Nodes: No axillary supraclavicular adenopathy. Mild mediastinal lymphadenopathy. For example 14 mm RIGHT paratracheal lymph node compares to 14 mm on prior 21  mm RIGHT hilar lymph node compares 23 mm node 17 mm LEFT hilar node compares 17 mm Lungs/Pleura: No lobe pulmonary nodules in the RIGHT upper lobe and RIGHT lower lobe in a peripheral pattern and not changed from comparison exam. Similar nodularity in LEFT Bentley but to a lesser degree Musculoskeletal: No aggressive osseous lesion. CT ABDOMEN AND PELVIS FINDINGS Hepatobiliary: Multiple low-density lesions in the liver. Normal gallbladder. Pancreas: Pancreas is normal. No ductal dilatation. No pancreatic inflammation. Spleen: Normal spleen Adrenals/urinary tract: Adrenal glands and kidneys are normal. The ureters and bladder normal. Stomach/Bowel: Stomach, small bowel, appendix, and cecum are normal. The colon and rectosigmoid colon are normal. Vascular/Lymphatic: Abdominal aorta is normal caliber. There is no retroperitoneal or periportal lymphadenopathy. No pelvic lymphadenopathy. Reproductive: Prostate normal Other: No free fluid. Musculoskeletal: No aggressive osseous lesion. IMPRESSION: Chest Impression: 1. Mild mediastinal lymphadenopathy is stable. 2. Multiple tiny bilateral pulmonary nodules greater within the RIGHT Bentley are not changed. Abdomen / Pelvis Impression: 1. No evidence of lymphadenopathy in the abdomen pelvis. 2. Normal volume spleen. 3. Benign cysts in the liver. Electronically Signed   By: Suzy Bouchard M.D.   On: 09/21/2017 14:23   Ct Abdomen Pelvis W Contrast  Result  Date: 09/21/2017 CLINICAL DATA:  Non-Hodgkin's lymphoma diagnosed 2011, chemotherapy complete EXAM: CT CHEST, ABDOMEN, AND PELVIS WITH CONTRAST TECHNIQUE: Multidetector CT imaging of the chest, abdomen and pelvis was performed following the standard protocol during bolus administration of intravenous contrast. CONTRAST:  100 cc Isovue COMPARISON:  CT 03/21/2017 FINDINGS: CT CHEST FINDINGS Cardiovascular: No significant vascular findings. Normal heart size. No pericardial effusion. Mediastinum/Nodes: No axillary  supraclavicular adenopathy. Mild mediastinal lymphadenopathy. For example 14 mm RIGHT paratracheal lymph node compares to 14 mm on prior 21 mm RIGHT hilar lymph node compares 23 mm node 17 mm LEFT hilar node compares 17 mm Lungs/Pleura: No lobe pulmonary nodules in the RIGHT upper lobe and RIGHT lower lobe in a peripheral pattern and not changed from comparison exam. Similar nodularity in LEFT Bentley but to a lesser degree Musculoskeletal: No aggressive osseous lesion. CT ABDOMEN AND PELVIS FINDINGS Hepatobiliary: Multiple low-density lesions in the liver. Normal gallbladder. Pancreas: Pancreas is normal. No ductal dilatation. No pancreatic inflammation. Spleen: Normal spleen Adrenals/urinary tract: Adrenal glands and kidneys are normal. The ureters and bladder normal. Stomach/Bowel: Stomach, small bowel, appendix, and cecum are normal. The colon and rectosigmoid colon are normal. Vascular/Lymphatic: Abdominal aorta is normal caliber. There is no retroperitoneal or periportal lymphadenopathy. No pelvic lymphadenopathy. Reproductive: Prostate normal Other: No free fluid. Musculoskeletal: No aggressive osseous lesion. IMPRESSION: Chest Impression: 1. Mild mediastinal lymphadenopathy is stable. 2. Multiple tiny bilateral pulmonary nodules greater within the RIGHT Bentley are not changed. Abdomen / Pelvis Impression: 1. No evidence of lymphadenopathy in the abdomen pelvis. 2. Normal volume spleen. 3. Benign cysts in the liver. Electronically Signed   By: Suzy Bouchard M.D.   On: 09/21/2017 14:23   ASSESSMENT AND PLAN:  This is a very pleasant 56 years old African-American male with history of stage II large B-cell non-Hodgkin lymphoma status post chemotherapy with CHOP/Rituxan with complete response. Has been observation since 2013. The patient is feeling well today with no specific complaints. Repeat CT scan of the chest, abdomen and pelvis showed no concerning findings for disease recurrence. I discussed the scan  results with the patient and recommended for him to continue on observation with repeat CBC, comprehensive metabolic panel and LDH in 1 year. He was advised to call immediately if he has any concerning symptoms in the interval. The patient voices understanding of current disease status and treatment options and is in agreement with the current care plan. All questions were answered. The patient knows to call the clinic with any problems, questions or concerns. We can certainly see the patient much sooner if necessary. I spent 10 minutes counseling the patient face to face. The total time spent in the appointment was 15 minutes.  Disclaimer: This note was dictated with voice recognition software. Similar sounding words can inadvertently be transcribed and may be missed upon review.

## 2017-11-02 ENCOUNTER — Ambulatory Visit: Payer: PRIVATE HEALTH INSURANCE | Admitting: Family Medicine

## 2017-11-09 ENCOUNTER — Ambulatory Visit (INDEPENDENT_AMBULATORY_CARE_PROVIDER_SITE_OTHER): Payer: No Typology Code available for payment source | Admitting: Pharmacist

## 2017-11-09 DIAGNOSIS — Z9889 Other specified postprocedural states: Secondary | ICD-10-CM | POA: Diagnosis not present

## 2017-11-09 DIAGNOSIS — Z7901 Long term (current) use of anticoagulants: Secondary | ICD-10-CM

## 2017-11-09 DIAGNOSIS — Z86711 Personal history of pulmonary embolism: Secondary | ICD-10-CM | POA: Diagnosis not present

## 2017-11-09 LAB — POCT INR: INR: 3

## 2017-11-25 ENCOUNTER — Ambulatory Visit: Payer: PRIVATE HEALTH INSURANCE | Admitting: Family Medicine

## 2017-11-25 ENCOUNTER — Encounter: Payer: Self-pay | Admitting: Family Medicine

## 2017-11-25 VITALS — BP 110/86 | HR 100 | Wt 198.4 lb

## 2017-11-25 DIAGNOSIS — L739 Follicular disorder, unspecified: Secondary | ICD-10-CM | POA: Diagnosis not present

## 2017-11-25 DIAGNOSIS — R609 Edema, unspecified: Secondary | ICD-10-CM

## 2017-11-25 DIAGNOSIS — R229 Localized swelling, mass and lump, unspecified: Secondary | ICD-10-CM

## 2017-11-25 NOTE — Progress Notes (Signed)
   Subjective:    Patient ID: Leonard Bentley, male    DOB: Nov 10, 1960, 57 y.o.   MRN: [id number]  HPI He is here for consult concerning multiple issues.  He complains of scattered reddish slightly tender lesions that develop and then go away relatively quickly.  These occur mainly on his legs.  He also notes a lesion present on the bottom of his left foot that he would like evaluated.  He also states that he sees swelling in his lower extremities especially when he stands for long periods of time.   Review of Systems     Objective:   Physical Exam Alert and in no distress.  One small erythematous follicular type lesion is noted on his right thigh.  Lower extremities do show some 1+ pitting edema.  He does have a nodule present on the palmar surface of his left foot.       Assessment & Plan:  Folliculitis  Peripheral edema  Nodule, subcutaneous I explained that he probably has a very low-grade folliculitis and did recommend either using Lever 2000 or Dial soap.  Also mention the possibility of using Hibiclens periodically to help this.  Discussed that if this gets worse and develops into a larger more painful lesion, surgical intervention may be needed.  Recommended support hose for the peripheral edema.  Explained that this is not unusual when standing for long periods of time. I explained that the nodule is benign and probably an early Dupuytren's type lesion.  No intervention at the present time.

## 2017-11-25 NOTE — Patient Instructions (Signed)
Use either Dial soap or Lever 2000 .  You can also use Hibiclens when he have an outbreak

## 2017-12-07 ENCOUNTER — Other Ambulatory Visit: Payer: Self-pay | Admitting: Family Medicine

## 2017-12-07 NOTE — Telephone Encounter (Signed)
Pt called for refill too

## 2017-12-26 ENCOUNTER — Encounter (HOSPITAL_COMMUNITY): Payer: Self-pay | Admitting: Emergency Medicine

## 2017-12-26 ENCOUNTER — Ambulatory Visit (HOSPITAL_COMMUNITY)
Admission: EM | Admit: 2017-12-26 | Discharge: 2017-12-26 | Disposition: A | Discharge status: Home / Self Care | Payer: No Typology Code available for payment source | Attending: Family Medicine | Admitting: Family Medicine

## 2017-12-26 ENCOUNTER — Other Ambulatory Visit: Payer: Self-pay

## 2017-12-26 ENCOUNTER — Telehealth: Payer: Self-pay

## 2017-12-26 DIAGNOSIS — J0100 Acute maxillary sinusitis, unspecified: Secondary | ICD-10-CM

## 2017-12-26 MED ORDER — FLUTICASONE PROPIONATE 50 MCG/ACT NA SUSP: 2.0000 | Freq: Every day | NASAL | 12 refills | Status: DC

## 2017-12-26 MED ORDER — BENZONATATE 100 MG PO CAPS: 100.0000 mg | ORAL_CAPSULE | Freq: Three times a day (TID) | ORAL | 0 refills | Status: DC

## 2017-12-26 NOTE — ED Provider Notes (Signed)
[facility]   [id number] 12/26/17 Arrival Time: 1859   SUBJECTIVE:  Leonard Bentley is a 57 y.o. male who presents to the urgent care with complaint of Sinus issues for a week.  Reports symptoms of green sinus congestion, feeling hot and cold, weakness.  Patient has a cough and coughing episodes.    Patient thinks he saw bright red streaks of blood in stool.  Reports stool as soft.  Patient is on Coumadin and drinks heavily on the weekends.  He has fallen recently and abraded his right chest.    Past Medical History:  Diagnosis Date  . Acute pulmonary embolism ([facility]) 04/11/2016  . Anxiety   . Arthritis   . Atrial tachycardia ([facility]) 01/06/2016  . Cancer ([facility]) 07/2011   STAGE II NON- HODGKIN LYMPHOMA  . Depression   . Dyspnea   . ED (erectile dysfunction)   . Hypertension   . Lymphoma, non Hodgkin's 09/25/2011  . Mitral regurgitation 01/20/2016  . Mobitz I 10/09/2015  . Murmur 01/06/2016  . Occasional tremors   . PAC (premature atrial contraction) 10/09/2015  . S/P minimally-invasive mitral valve repair 10/27/2016   Complex valvuloplasty including artificial Gore-tex neochord placement x6 and 34 mm Edwards Physio II ring annuloplasty via right mini thoracotomy approach   Family History  Problem Relation Age of Onset  . Cancer Mother        BREAST(BONE)  . Cancer Father        PANCREATIC  . Hypertension Maternal Grandmother   . Stroke Maternal Aunt   . Heart attack Neg Hx    Social History   Socioeconomic History  . Marital status: Single    Spouse name: Not on file  . Number of children: Not on file  . Years of education: Not on file  . Highest education level: Not on file  Social Needs  . Financial resource strain: Not on file  . Food insecurity - worry: Not on file  . Food insecurity - inability: Not on file  . Transportation needs - medical: Not on file  . Transportation needs - non-medical: Not on file  Occupational History  . Not on file  Tobacco Use  .  Smoking status: Former Smoker    Packs/Tomaro: 1.00    Years: 32.00    Pack years: 32.00    Types: Cigarettes, E-cigarettes    Last attempt to quit: 07/06/2013    Years since quitting: 4.4  . Smokeless tobacco: Current User  . Tobacco comment: smokes vapor cig. 3 times [place]  Substance and Sexual Activity  . Alcohol use: Yes    Alcohol/week: 0.0 oz    Comment:  1/2 bottle of wine a [place]  . Drug use: No  . Sexual activity: No  Other Topics Concern  . Not on file  Social History Narrative   Epworth sleepiness scale as of 10/09/15 a 1   Current Meds  Medication Sig  . warfarin (COUMADIN) 5 MG tablet TAKE 1 TABLET BY MOUTH ONCE DAILY AT  6PM.   No Known Allergies    ROS: As per HPI, remainder of ROS negative.   OBJECTIVE:   Vitals:   12/26/17 1946  BP: 134/86  Pulse: 80  Resp: 18  Temp: 98.3 F (36.8 C)  TempSrc: Oral  SpO2: 96%     General appearance: alert; no distress Eyes: PERRL; EOMI; conjunctiva normal HENT: normocephalic; atraumatic; TMs normal, canal normal, external ears normal without trauma; nasal mucosa normal; oral mucosa normal Neck: supple  Lungs: clear to auscultation bilaterally Heart: regular rate and rhythm Abdomen: soft, non-tender; bowel sounds normal; no masses or organomegaly; no guarding or rebound tenderness Back: no CVA tenderness Extremities: no cyanosis or edema; symmetrical with no gross deformities Skin: warm and dry Neurologic: normal gait; grossly normal; very coarse tremor Psychological: alert and cooperative; normal mood and affect      Labs:  Results for orders placed or performed in visit on 11/09/17  POCT INR  Result Value Ref Range   INR 3.0     Labs Reviewed - No data to display  No results found.     ASSESSMENT & PLAN:  1. Acute maxillary sinusitis, recurrence not specified     Meds ordered this encounter  Medications  . fluticasone (FLONASE) 50 MCG/ACT nasal spray    Sig: Place 2 sprays into both nostrils  daily.    Dispense:  16 g    Refill:  12  . benzonatate (TESSALON) 100 MG capsule    Sig: Take 1 capsule (100 mg total) by mouth every 8 (eight) hours.    Dispense:  21 capsule    Refill:  0    Reviewed expectations re: course of current medical issues. Questions answered. Outlined signs and symptoms indicating need for more acute intervention. Patient verbalized understanding. After Visit Summary given.    Procedures:      Robyn Haber, MD 12/26/17 2001

## 2017-12-26 NOTE — Discharge Instructions (Signed)
Avoid alcohol while you are taking Coumadin.  I advise you to get your ProTime checked with Dr. Redmond School at your earliest convenience.

## 2017-12-26 NOTE — Telephone Encounter (Signed)
Pt is taking warfarin having some bleeding issues. Bleeding is coming from his bottom during a bowel movement. Pt is going to urgent care just wanted you to know .

## 2017-12-26 NOTE — ED Triage Notes (Signed)
Sinus issues for a week.  Reports symptoms of green sinus congestion, feeling hot and cold, weakness.  Patient has a cough and coughing episodes.    Patient thinks he saw bright red streaks of blood in stool.  Reports stool as soft

## 2017-12-27 ENCOUNTER — Telehealth: Payer: Self-pay | Admitting: Family Medicine

## 2017-12-27 NOTE — Telephone Encounter (Signed)
Pt called and stated that he sees Dr. [name] over at [facility] care and they take care of his coumadin. He would like to know if that is something we can handle for him. Please advise pt at [phone].

## 2017-12-29 NOTE — Telephone Encounter (Signed)
He can switch over to [place] but make sure he knows that there will still be a charge

## 2017-12-30 NOTE — Telephone Encounter (Signed)
Pt called and voice mail was left . [place]

## 2018-01-09 ENCOUNTER — Encounter: Payer: Self-pay | Admitting: Family Medicine

## 2018-01-09 ENCOUNTER — Ambulatory Visit: Payer: PRIVATE HEALTH INSURANCE | Admitting: Family Medicine

## 2018-01-09 VITALS — BP 124/90 | HR 68 | Ht 69.0 in | Wt 191.0 lb

## 2018-01-09 DIAGNOSIS — B349 Viral infection, unspecified: Secondary | ICD-10-CM

## 2018-01-09 DIAGNOSIS — J302 Other seasonal allergic rhinitis: Secondary | ICD-10-CM | POA: Diagnosis not present

## 2018-01-09 MED ORDER — LORATADINE 10 MG PO TABS: 10.0000 mg | ORAL_TABLET | Freq: Every day | ORAL | 11 refills | Status: DC

## 2018-01-09 NOTE — Patient Instructions (Signed)
  You do not have any evidence of ongoing infection. Consider also resuming claritin/loratidine, if needed for ongoing runny nose/congestion/drainage.

## 2018-01-09 NOTE — Progress Notes (Signed)
Chief Complaint  Patient presents with  . Consult    had sinusitis and missed more than 3 days of work-needs note to return to work.   . Other    would also like to know if we can check is PT/INR-normally gets checked by his cardiologist Dr [name].   . Flu Vaccine    declined.    Went to the Urgent Care on 2/18 for a sinus infection. He was prescribed flonase and tessalon.  ABX were not prescribed (pt states due to him being on coumadin, I suspect it was felt to be viral in origin).  The following week he continued to feel badly--He felt weak, dizzy, ongoing nasal drainage, postnasal drainage and cough.  He also had some nausea, vomiting and diarrhea last week. He stayed home 4 days that week--he needs a note if he misses more than 3. He missed 2/25-28.  He wasn't allowed back to work on 3/1 without a note.  He reports he is not 100% better, but significantly improved.  Some residual PND. Some intermittent cough, productive of white phlegm (sometimes yellow-green in the morning only).  Nasal drainage is also clear, but discolored in mornings.  Denies fevers. Denies chest pain, shortness of breath  He took tessalon the first week, no longer needing. Continues to use Flonase. Not currently using claritin.  PMH, PSH, SH reviewed   Outpatient Encounter Medications as of 01/09/2018  Medication Sig Note  . ALPRAZolam (XANAX) 0.25 MG tablet TAKE 1 TABLET BY MOUTH TWICE DAILY AS NEEDED FOR ANXIETY   . benzonatate (TESSALON) 100 MG capsule Take 1 capsule (100 mg total) by mouth every 8 (eight) hours.   . fluticasone (FLONASE) 50 MCG/ACT nasal spray Place 2 sprays into both nostrils daily.   Marland Kitchen warfarin (COUMADIN) 5 MG tablet TAKE 1 TABLET BY MOUTH ONCE DAILY AT  6PM.   . LORATADINE PO Take by mouth.   . metoprolol succinate (TOPROL-XL) 50 MG 24 hr tablet TAKE 1 TABLET BY MOUTH ONCE DAILY (NEW  DOSE) (Patient not taking: Reported on 01/09/2018) 10/06/2017: Takes prn   No  facility-administered encounter medications on file as of 01/09/2018.    No Known Allergies  ROS: He is on coumadin.  Denies bleeding. No fever, chills, no headaches.  Intermittent dizziness (off balance, denies lightheadedness).  Denies falls.  No nausea, vomiting, diarrhea--he did have these last week, now resolved. No chest pain, shortness of breath, rash.  PHYSICAL EXAM:  BP 124/90   Pulse 68   Ht 5\' 9"  (1.753 m)   Wt 191 lb (86.6 kg)   BMI 28.21 kg/m    Well appearing ,pleasant male in no distress HEENT: PERRL, EOMI, conjunctiva and sclera are clear. TM's and EAC's normal. OP is clear. Nasal mucosa is normal, no erythema or purulence. Sinuses are nontender Neck: no lymphadenopathy or mass Heart: regular rate and rhythm Lungs: clear bilaterally Abdomen: soft, normal bowel sounds, nontender, no mass Extremities: no edema Skin: normal turgor, no rash Neuro: alert and oriented, cranial nerves intact, normal gait Psych: normal mood, affect, hygiene and grooming   ASSESSMENT/PLAN:  Acute viral syndrome - resolved; note to return to work without restrictions  Seasonal allergies - Plan: loratadine (CLARITIN) 10 MG tablet     You do not have any evidence of ongoing infection. Consider also resuming claritin/loratidine, if needed for ongoing runny nose/congestion/drainage.

## 2018-01-20 ENCOUNTER — Ambulatory Visit: Payer: PRIVATE HEALTH INSURANCE | Admitting: Medical

## 2018-01-20 ENCOUNTER — Encounter: Payer: Self-pay | Admitting: Medical

## 2018-01-20 VITALS — BP 122/84 | HR 95 | Temp 98.0°F | Wt 192.4 lb

## 2018-01-20 DIAGNOSIS — R21 Rash and other nonspecific skin eruption: Secondary | ICD-10-CM | POA: Diagnosis not present

## 2018-01-20 DIAGNOSIS — M25572 Pain in left ankle and joints of left foot: Secondary | ICD-10-CM

## 2018-01-20 DIAGNOSIS — M21962 Unspecified acquired deformity of left lower leg: Secondary | ICD-10-CM | POA: Diagnosis not present

## 2018-01-20 DIAGNOSIS — M25472 Effusion, left ankle: Secondary | ICD-10-CM

## 2018-01-20 MED ORDER — TRIAMCINOLONE ACETONIDE 0.1 % EX CREA: 1.0000 "application " | TOPICAL_CREAM | Freq: Two times a day (BID) | CUTANEOUS | 0 refills | Status: DC

## 2018-01-20 NOTE — Progress Notes (Signed)
Subjective: Chief Complaint  Patient presents with  . other    hair folliculiti issues , ankle issues   Here for 2 issues.  He reports seeing Dr. Redmond School back in January for a rash on his forearms and thighs, they come and go.  They are not itchy.  They start out as bumps with surrounding red brown coloration but not hot.  No drainage.  He has been using the Hibiclens washes and Dial soap baths advised by Dr. Redmond School but not improving.  No other aggravating or relieving factors.  No prior similar.   Has had these ongoing for a few months now.  He reports several month history of localized swelling of left medial ankle.   Denies trauma, fall, injury.   He notes in remote past had pain in left ankle relieved with shoe inserts per ortho.  He doesn't recall having bony growth or swelling like this prior.  He runs for exercise somewhat regularly and running seems to help.  No other aggravating or relieving factors. No other complaint.  Past Medical History:  Diagnosis Date  . Acute pulmonary embolism ([place]) 04/11/2016  . Anxiety   . Arthritis   . Atrial tachycardia ([place]) 01/06/2016  . Cancer ([place]) 07/2011   STAGE II NON- HODGKIN LYMPHOMA  . Depression   . Dyspnea   . ED (erectile dysfunction)   . Hypertension   . Lymphoma, non Hodgkin's 09/25/2011  . Mitral regurgitation 01/20/2016  . Mobitz I 10/09/2015  . Murmur 01/06/2016  . Occasional tremors   . PAC (premature atrial contraction) 10/09/2015  . S/P minimally-invasive mitral valve repair 10/27/2016   Complex valvuloplasty including artificial Gore-tex neochord placement x6 and 34 mm Edwards Physio II ring annuloplasty via right mini thoracotomy approach   Current Outpatient Medications on File Prior to Visit  Medication Sig Dispense Refill  . ALPRAZolam (XANAX) 0.25 MG tablet TAKE 1 TABLET BY MOUTH TWICE DAILY AS NEEDED FOR ANXIETY 30 tablet 0  . fluticasone (FLONASE) 50 MCG/ACT nasal spray Place 2 sprays into both nostrils daily. 16 g 12   . loratadine (CLARITIN) 10 MG tablet Take 1 tablet (10 mg total) by mouth daily. 30 tablet 11  . metoprolol succinate (TOPROL-XL) 50 MG 24 hr tablet TAKE 1 TABLET BY MOUTH ONCE DAILY (NEW  DOSE) 90 tablet 2  . warfarin (COUMADIN) 5 MG tablet TAKE 1 TABLET BY MOUTH ONCE DAILY AT  6PM. 90 tablet 0  . benzonatate (TESSALON) 100 MG capsule Take 1 capsule (100 mg total) by mouth every 8 (eight) hours. (Patient not taking: Reported on 01/09/2018) 21 capsule 0   No current facility-administered medications on file prior to visit.    ROS as in subjective  Objective: BP 122/84 (BP Location: Left Arm, Patient Position: Sitting)   Pulse 95   Temp 98 F (36.7 C)   Wt 192 lb 6.4 oz (87.3 kg)   SpO2 98%   BMI 28.41 kg/m   Wt Readings from Last 3 Encounters:  01/20/18 192 lb 6.4 oz (87.3 kg)  01/09/18 191 lb (86.6 kg)  11/25/17 198 lb 6.4 oz (90 kg)   Gen: wd, wn, nad Skin: several scattered individual patches of brownish coloration with slightly raised central area of the lesions.  They are located on right forearm, less so left forearm, right medial knee and medial thigh and medial lower leg.   Most are 4-9 mm diameter.  Lesions are non tender, no induration, no warmth, no fluctuance, no drainage, no vesicles, no  pustules.    Left medial ankle with what appears to be bony deformity/enlargement, slight puffy swelling inferior to left medial malleolus.  The prominence protrudes medially.  There is somewhat loss of arch of both feet.   Area of bony deformity left ankle medially is tender to touch.  Left ankle ROM somewhat reduced due to bony prominence but otherwise ankle normal ROM Pedal pulses 1+ bilat, cap refill normal, no skin lesions of feet Neuro: slight action tremor noted, otherwise non focal exam   Assessment: Encounter Diagnoses  Name Primary?  . Acute left ankle pain Yes  . Ankle swelling, left   . Foot deformity, acquired, left   . Rash     Plan: Ankle pain and swelling,  deformity - will send for xray.  I suspect arthritis changes and deformity possibly related to acquired foot deformity changes, flattening of the arch.   Advised he wear shoes with supportive arch.  Rash - unclear etiology.    Begin TAC cream below, and if not improving or resolved in the next 10-14 days, then refer to dermatology  Edger was seen today for other.  Diagnoses and all orders for this visit:  Acute left ankle pain -     DG Ankle Complete Left; Future  Ankle swelling, left -     DG Ankle Complete Left; Future  Foot deformity, acquired, left -     DG Ankle Complete Left; Future  Rash  Other orders -     triamcinolone cream (KENALOG) 0.1 %; Apply 1 application topically 2 (two) times daily.

## 2018-02-10 ENCOUNTER — Ambulatory Visit
Admission: RE | Admit: 2018-02-10 | Discharge: 2018-02-10 | Disposition: A | Discharge status: Home / Self Care | Payer: No Typology Code available for payment source | Source: Ambulatory Visit | Attending: Medical | Admitting: Medical

## 2018-02-10 ENCOUNTER — Telehealth: Payer: Self-pay | Admitting: Family Medicine

## 2018-02-10 DIAGNOSIS — M25472 Effusion, left ankle: Secondary | ICD-10-CM

## 2018-02-10 DIAGNOSIS — M25572 Pain in left ankle and joints of left foot: Secondary | ICD-10-CM

## 2018-02-10 DIAGNOSIS — M21962 Unspecified acquired deformity of left lower leg: Secondary | ICD-10-CM

## 2018-02-10 NOTE — Telephone Encounter (Signed)
Pt called and is requesting a refill on his kenalog cream, pt uses [facility], [place] - [address] pt can be reached at [phone]

## 2018-02-13 ENCOUNTER — Other Ambulatory Visit: Payer: Self-pay | Admitting: Medical

## 2018-02-13 ENCOUNTER — Telehealth: Payer: Self-pay

## 2018-02-13 DIAGNOSIS — M1990 Unspecified osteoarthritis, unspecified site: Secondary | ICD-10-CM

## 2018-02-13 MED ORDER — TRIAMCINOLONE ACETONIDE 0.1 % EX CREA: 1.0000 "application " | TOPICAL_CREAM | Freq: Two times a day (BID) | CUTANEOUS | 0 refills | Status: DC

## 2018-02-13 NOTE — Telephone Encounter (Signed)
Called patient, LVM to call back to discuss lab work. Left call back number. Referral placed.

## 2018-02-13 NOTE — Telephone Encounter (Signed)
-----   Message from Carlena Hurl, PA-C sent at 02/13/2018 11:04 AM EDT ----- There is soft tissue swelling and some degenerative changes noted.  I suspect there is some issues with alignment of ankle and foot and this is likely contributing to inflammation and swelling.   I recommend a referral to orthopedist for additional eval and treatment.  In the meantime, I recommend a shoe with good arch support.

## 2018-02-14 ENCOUNTER — Ambulatory Visit: Payer: PRIVATE HEALTH INSURANCE | Admitting: Family Medicine

## 2018-02-14 ENCOUNTER — Encounter: Payer: Self-pay | Admitting: Family Medicine

## 2018-02-14 VITALS — BP 116/68 | HR 82 | Temp 97.6°F | Ht 69.0 in | Wt 190.6 lb

## 2018-02-14 DIAGNOSIS — M2142 Flat foot [pes planus] (acquired), left foot: Secondary | ICD-10-CM | POA: Diagnosis not present

## 2018-02-14 DIAGNOSIS — M720 Palmar fascial fibromatosis [Dupuytren]: Secondary | ICD-10-CM | POA: Diagnosis not present

## 2018-02-14 DIAGNOSIS — Z86711 Personal history of pulmonary embolism: Secondary | ICD-10-CM

## 2018-02-14 DIAGNOSIS — M2141 Flat foot [pes planus] (acquired), right foot: Secondary | ICD-10-CM

## 2018-02-14 NOTE — Progress Notes (Signed)
   Subjective:    Patient ID: Axton Cihlar Co, male    DOB: 09-13-1961, 57 y.o.   MRN: [id number]  HPI He is here for consult concerning multiple issues.  He does have several brownish lesions present on his legs that he states starts out usually is ready slightly tender lesions.  He presently is on Coumadin but admits not having it tested in since January.  He also complains of a nodule in his left hand near the MCP joint.  It intermittently causes difficulty.  He also continues have left ankle discomfort.  Recent x-ray did show some medial swelling.   Review of Systems     Objective:   Physical Exam Exam of the skin does show several lesions that look like easy bruising probably from his Coumadin.  He does have a 0.5 cm slightly tender easily movable lesion present at the base of the fourth MCP joint.  Exam of both feet does show a flat feet with some swelling to the medial aspect of the left ankle but no tenderness to palpation.      Assessment & Plan:  Dupuytren's contracture of hand  Pes planus of both feet  Left sided numbness I explained that the contracture is not a big issue until it starts to cause difficulty with ADLs on a regular basis then I would recommend getting it removed but not at the present time.  He was comfortable with that. He is to go to the Coumadin clinic tomorrow as I think that the lesions present on his skin I really probably related to Coumadin therapy. Recommend he get well fitting shoes that give good arch supports.  He has done this in the past and it did help.  Recommend over-the-counter supports rather than getting a more expensive ones but will ship to podiatry if need be.

## 2018-02-15 ENCOUNTER — Ambulatory Visit (INDEPENDENT_AMBULATORY_CARE_PROVIDER_SITE_OTHER)
Payer: No Typology Code available for payment source | Admitting: Pharmacist Clinician (PhC)/ Clinical Pharmacy Specialist

## 2018-02-15 DIAGNOSIS — Z86711 Personal history of pulmonary embolism: Secondary | ICD-10-CM | POA: Diagnosis not present

## 2018-02-15 DIAGNOSIS — Z7901 Long term (current) use of anticoagulants: Secondary | ICD-10-CM | POA: Diagnosis not present

## 2018-02-15 DIAGNOSIS — Z9889 Other specified postprocedural states: Secondary | ICD-10-CM | POA: Diagnosis not present

## 2018-02-15 LAB — POCT INR: INR: 3.3

## 2018-02-21 ENCOUNTER — Encounter (INDEPENDENT_AMBULATORY_CARE_PROVIDER_SITE_OTHER): Payer: Self-pay | Admitting: Orthopaedic Surgery

## 2018-02-21 ENCOUNTER — Ambulatory Visit (INDEPENDENT_AMBULATORY_CARE_PROVIDER_SITE_OTHER): Payer: PRIVATE HEALTH INSURANCE | Admitting: Orthopaedic Surgery

## 2018-02-21 DIAGNOSIS — M76822 Posterior tibial tendinitis, left leg: Secondary | ICD-10-CM

## 2018-02-21 DIAGNOSIS — M76829 Posterior tibial tendinitis, unspecified leg: Secondary | ICD-10-CM | POA: Insufficient documentation

## 2018-02-21 MED ORDER — DICLOFENAC SODIUM 1 % TD GEL: 2.0000 g | Freq: Four times a day (QID) | TRANSDERMAL | Status: DC | PRN

## 2018-02-21 MED ORDER — DICLOFENAC SODIUM 1 % TD GEL: TRANSDERMAL | 0 refills | Status: DC

## 2018-02-21 NOTE — Progress Notes (Signed)
Office Visit Note   Patient: Leonard Bentley           Date of Birth: 03/30/1961           MRN: [id number] Visit Date: 02/21/2018              Requested by: [name], MD [place], [place] [address] PCP: [name], MD   Assessment & Plan: Visit Diagnoses:  1. Posterior tibial tendon dysfunction     Plan: Impression is left foot posterior tibial tendon dysfunction.  At this point, we will place the patient in a cam walker weightbearing as tolerated for the next 2 to 3 weeks.  I will also call in a prescription of Voltaren gel to use as needed.  He will follow-up with [place] in 2 to 3 weeks time for repeat evaluation.  Call if concerns or questions in the meantime  Follow-Up Instructions: Return in about 3 weeks (around 03/14/2018).   Orders:  No orders of the defined types were placed in this encounter.  No orders of the defined types were placed in this encounter.     Procedures: No procedures performed   Clinical Data: No additional findings.   Subjective: Chief Complaint  Patient presents with  . Left Ankle - Pain  . Left Foot - Pain    HPI patient is a pleasant 57 year old gentleman who presents to our clinic today with left foot and ankle pain.  This began 3 to 4 months ago without any known injury or change in activity.  All the pain is over the medial side of his left ankle/foot.  This is worse with ambulation and at the end of the [place].  Rest seems to make it a little better.  He has been given orthotics in the past which have seemed to help then but are not helping now.  No numbness tingling burning.  Review of Systems as detailed in HPI.  All others are negative.   Objective: Vital Signs: There were no vitals taken for this visit.  Physical Exam well-developed well-nourished gentleman no acute distress.  Alert and oriented x3.  Ortho Exam examination of the left foot reveals rigid pes planus.  Marked tenderness along the posterior  tibial tendon.  Increased pain with resisted inversion and eversion.  Also of note, he has a nontender plantar fibromatosis.  He is neurovascularly intact distally.  Specialty Comments:  No specialty comments available.  Imaging: No new imaging today   PMFS History: Patient Active Problem List   Diagnosis Date Noted  . Posterior tibial tendon dysfunction 02/21/2018  . Elevated INR 11/27/2016  . Dehydration 11/27/2016  . SVT (supraventricular tachycardia) ([place])   . S/P minimally-invasive mitral valve repair 10/27/2016  . History of pulmonary embolus (PE) 04/11/2016  . Left sided numbness   . Anxiety disorder 04/02/2016  . Panic attacks 04/02/2016  . Mitral valve prolapse   . Mitral regurgitation 01/20/2016  . Atrial tachycardia ([place]) 01/06/2016  . Murmur 01/06/2016  . Mobitz I 10/09/2015  . PAC (premature atrial contraction) 10/09/2015  . Arthritis 08/14/2015  . Neuropathy due to chemotherapeutic drug ([facility]) 03/09/2013  . Leucopenia 01/31/2013  . Personal history of pulmonary embolism 12/28/2012  . Alcohol abuse 08/24/2012  . Cigarette smoker 08/24/2012  . Hemorrhoid 03/08/2012  . Anemia 12/10/2011  . History of non-Hodgkin's lymphoma 09/25/2011   Past Medical History:  Diagnosis Date  . Acute pulmonary embolism ([place]) 04/11/2016  . Anxiety   . Arthritis   .  Atrial tachycardia ([place]) 01/06/2016  . Cancer ([facility]) 07/2011   STAGE II NON- HODGKIN LYMPHOMA  . Depression   . Dyspnea   . ED (erectile dysfunction)   . Hypertension   . Lymphoma, non Hodgkin's 09/25/2011  . Mitral regurgitation 01/20/2016  . Mobitz I 10/09/2015  . Murmur 01/06/2016  . Occasional tremors   . PAC (premature atrial contraction) 10/09/2015  . S/P minimally-invasive mitral valve repair 10/27/2016   Complex valvuloplasty including artificial Gore-tex neochord placement x6 and 34 mm Edwards Physio II ring annuloplasty via right mini thoracotomy approach    Family History  Problem Relation Age of Onset    . Cancer Mother        BREAST(BONE)  . Cancer Father        PANCREATIC  . Hypertension Maternal Grandmother   . Stroke Maternal Aunt   . Heart attack Neg Hx     Past Surgical History:  Procedure Laterality Date  . CARDIAC CATHETERIZATION N/A 08/13/2016   Procedure: Right/Left Heart Cath and Coronary Angiography;  Surgeon: [name], MD;  Location: [facility];  Service: Cardiovascular;  Laterality: N/A;  . MITRAL VALVE REPAIR Right 10/27/2016   Procedure: MINIMALLY INVASIVE MITRAL VALVE REPAIR (MVR) USING 34 PHYSIO II ANNULOPLASTY RING;  Surgeon: [name], MD;  Location: [facility];  Service: Open Heart Surgery;  Laterality: Right;  . NO PAST SURGERIES    . TEE WITHOUT CARDIOVERSION N/A 02/11/2016   Procedure: TRANSESOPHAGEAL ECHOCARDIOGRAM (TEE);  Surgeon: [name], MD;  Location: [facility];  Service: Cardiovascular;  Laterality: N/A;  . TEE WITHOUT CARDIOVERSION N/A 10/27/2016   Procedure: TRANSESOPHAGEAL ECHOCARDIOGRAM (TEE);  Surgeon: [name], MD;  Location: [facility];  Service: Open Heart Surgery;  Laterality: N/A;  . TRIGGER FINGER RELEASE Bilateral    Social History   Occupational History  . Not on file  Tobacco Use  . Smoking status: Former Smoker    Packs/Weltz: 1.00    Years: 32.00    Pack years: 32.00    Types: Cigarettes, E-cigarettes    Last attempt to quit: 07/06/2013    Years since quitting: 4.6  . Smokeless tobacco: Current User  . Tobacco comment: smokes vapor cig. 3 times [place]  Substance and Sexual Activity  . Alcohol use: Yes    Alcohol/week: 0.0 oz    Comment:  1/2 bottle of wine a [place]  . Drug use: No  . Sexual activity: Never

## 2018-03-13 ENCOUNTER — Telehealth: Payer: Self-pay | Admitting: Cardiovascular Disease

## 2018-03-13 MED ORDER — WARFARIN SODIUM 5 MG PO TABS: ORAL_TABLET | ORAL | 0 refills | Status: DC

## 2018-03-13 NOTE — Telephone Encounter (Signed)
New Message:       *STAT* If patient is at the pharmacy, call can be transferred to refill team.   1. Which medications need to be refilled? (please list name of each medication and dose if known) warfarin (COUMADIN) 5 MG tablet  2. Which pharmacy/location (including street and city if local pharmacy) is medication to be sent to?[place], [place] - [address]  3. Do they need a [address] or [address] supply? [place]

## 2018-03-14 ENCOUNTER — Encounter (INDEPENDENT_AMBULATORY_CARE_PROVIDER_SITE_OTHER): Payer: Self-pay | Admitting: Orthopaedic Surgery

## 2018-03-14 ENCOUNTER — Ambulatory Visit (INDEPENDENT_AMBULATORY_CARE_PROVIDER_SITE_OTHER): Payer: PRIVATE HEALTH INSURANCE | Admitting: Orthopaedic Surgery

## 2018-03-14 DIAGNOSIS — M76829 Posterior tibial tendinitis, unspecified leg: Secondary | ICD-10-CM

## 2018-03-14 NOTE — Progress Notes (Signed)
Office Visit Note   Patient: Leonard Bentley           Date of Birth: [birth year]           MRN: [id number] Visit Date: 03/14/2018              Requested by: [name], MD [address] [place], [place] [address] PCP: [name], MD   Assessment & Plan: Visit Diagnoses:  1. Posterior tibial tendon dysfunction     Plan: Impression is posterior tibial tendinitis left foot.  I have reinforced the need to stabilize the left ankle to reduce the severity of the symptoms.  He states that he would like to continue wearing the full foot orthotic.  We will start him in outpatient physical therapy for range of motion and modalities.  I have given him samples of pen said he will follow-up with [place] in 4 weeks time for recheck.  Follow-Up Instructions: Return in about 1 month (around 04/11/2018).   Orders:  No orders of the defined types were placed in this encounter.  No orders of the defined types were placed in this encounter.     Procedures: No procedures performed   Clinical Data: No additional findings.   Subjective: Chief Complaint  Patient presents with  . Left Foot - Pain  . Left Ankle - Pain    HPI patient comes in for follow-up of his left ankle.  I saw him about a month ago for posterior tibial tendinitis.  I placed him in a cam walker weightbearing as tolerated.  He really did not wear this up very much but did go out my full foot orthotic.  He has been wearing this for the past 3 to 4 weeks with some relief of symptoms.  He notes pain only when standing or walking.  This is resolved when he sits down to rest.  All the pain is still to the medial aspect of the ankle along the posterior tibial tendon.  He has also been using Voltaren gel which has been helping somewhat.  He has been unable to take oral anti-inflammatories as he is on Coumadin for mitral valve repair.  Review of Systems as detailed in HPI.  All others reviewed and are  negative.   Objective: Vital Signs: There were no vitals taken for this visit.  Physical Exam well-developed well-nourished gentleman no acute distress.  Alert and oriented x3.  Ortho Exam examination of his left foot shows moderate tenderness along the posterior tibial tendon.  Pes planus.  Pain with inversion and eversion of the ankle.  Specialty Comments:  No specialty comments available.  Imaging: No new imaging   PMFS History: Patient Active Problem List   Diagnosis Date Noted  . Posterior tibial tendon dysfunction 02/21/2018  . Elevated INR 11/27/2016  . Dehydration 11/27/2016  . SVT (supraventricular tachycardia) ([facility])   . S/P minimally-invasive mitral valve repair 10/27/2016  . History of pulmonary embolus (PE) 04/11/2016  . Left sided numbness   . Anxiety disorder 04/02/2016  . Panic attacks 04/02/2016  . Mitral valve prolapse   . Mitral regurgitation 01/20/2016  . Atrial tachycardia ([facility]) 01/06/2016  . Murmur 01/06/2016  . Mobitz I 10/09/2015  . PAC (premature atrial contraction) 10/09/2015  . Arthritis 08/14/2015  . Neuropathy due to chemotherapeutic drug ([place]) 03/09/2013  . Leucopenia 01/31/2013  . Personal history of pulmonary embolism 12/28/2012  . Alcohol abuse 08/24/2012  . Cigarette smoker 08/24/2012  . Hemorrhoid 03/08/2012  .  Anemia 12/10/2011  . History of non-Hodgkin's lymphoma 09/25/2011   Past Medical History:  Diagnosis Date  . Acute pulmonary embolism ([facility]) 04/11/2016  . Anxiety   . Arthritis   . Atrial tachycardia ([place]) 01/06/2016  . Cancer ([facility]) 07/2011   STAGE II NON- HODGKIN LYMPHOMA  . Depression   . Dyspnea   . ED (erectile dysfunction)   . Hypertension   . Lymphoma, non Hodgkin's 09/25/2011  . Mitral regurgitation 01/20/2016  . Mobitz I 10/09/2015  . Murmur 01/06/2016  . Occasional tremors   . PAC (premature atrial contraction) 10/09/2015  . S/P minimally-invasive mitral valve repair 10/27/2016   Complex valvuloplasty including  artificial Gore-tex neochord placement x6 and 34 mm Edwards Physio II ring annuloplasty via right mini thoracotomy approach    Family History  Problem Relation Age of Onset  . Cancer Mother        BREAST(BONE)  . Cancer Father        PANCREATIC  . Hypertension Maternal Grandmother   . Stroke Maternal Aunt   . Heart attack Neg Hx     Past Surgical History:  Procedure Laterality Date  . CARDIAC CATHETERIZATION N/A 08/13/2016   Procedure: Right/Left Heart Cath and Coronary Angiography;  Surgeon: [name], MD;  Location: [facility];  Service: Cardiovascular;  Laterality: N/A;  . MITRAL VALVE REPAIR Right 10/27/2016   Procedure: MINIMALLY INVASIVE MITRAL VALVE REPAIR (MVR) USING 34 PHYSIO II ANNULOPLASTY RING;  Surgeon: [name], MD;  Location: [facility];  Service: Open Heart Surgery;  Laterality: Right;  . NO PAST SURGERIES    . TEE WITHOUT CARDIOVERSION N/A 02/11/2016   Procedure: TRANSESOPHAGEAL ECHOCARDIOGRAM (TEE);  Surgeon: [name], MD;  Location: [facility];  Service: Cardiovascular;  Laterality: N/A;  . TEE WITHOUT CARDIOVERSION N/A 10/27/2016   Procedure: TRANSESOPHAGEAL ECHOCARDIOGRAM (TEE);  Surgeon: [name], MD;  Location: [facility];  Service: Open Heart Surgery;  Laterality: N/A;  . TRIGGER FINGER RELEASE Bilateral    Social History   Occupational History  . Not on file  Tobacco Use  . Smoking status: Former Smoker    Packs/[place]oopmann: 1.00    Years: 32.00    Pack years: 32.00    Types: Cigarettes, E-cigarettes    Last attempt to quit: 07/06/2013    Years since quitting: 4.6  . Smokeless tobacco: Current User  . Tobacco comment: smokes vapor cig. 3 times [place]  Substance and Sexual Activity  . Alcohol use: Yes    Alcohol/week: 0.0 oz    Comment:  1/2 bottle of wine a [place]  . Drug use: No  . Sexual activity: Never

## 2018-03-15 ENCOUNTER — Ambulatory Visit (INDEPENDENT_AMBULATORY_CARE_PROVIDER_SITE_OTHER): Payer: No Typology Code available for payment source | Admitting: Pharmacist

## 2018-03-15 DIAGNOSIS — Z86711 Personal history of pulmonary embolism: Secondary | ICD-10-CM

## 2018-03-15 DIAGNOSIS — Z9889 Other specified postprocedural states: Secondary | ICD-10-CM | POA: Diagnosis not present

## 2018-03-15 DIAGNOSIS — Z7901 Long term (current) use of anticoagulants: Secondary | ICD-10-CM | POA: Diagnosis not present

## 2018-03-15 LAB — POCT INR: INR: 2.4

## 2018-03-17 ENCOUNTER — Telehealth (INDEPENDENT_AMBULATORY_CARE_PROVIDER_SITE_OTHER): Payer: Self-pay | Admitting: Physician Assistant

## 2018-03-17 NOTE — Telephone Encounter (Signed)
Ok to fill 

## 2018-03-17 NOTE — Telephone Encounter (Signed)
Patient called needing Rx called in for him (Pennsaid) The number to contact patient is [phone]

## 2018-03-17 NOTE — Telephone Encounter (Signed)
yes

## 2018-03-20 NOTE — Telephone Encounter (Signed)
Did you want to fax a form for this or try it electronically?

## 2018-03-20 NOTE — Telephone Encounter (Signed)
FAXED FORM

## 2018-03-22 ENCOUNTER — Telehealth (INDEPENDENT_AMBULATORY_CARE_PROVIDER_SITE_OTHER): Payer: Self-pay | Admitting: Physician Assistant

## 2018-03-22 NOTE — Telephone Encounter (Signed)
Patient called advised he called the pharmacy and they do not have the Rx yet. Patient asked if the Rx can be sent again. The number to contact patient is [phone]

## 2018-03-22 NOTE — Telephone Encounter (Signed)
Filled out form for pennsaid. How ling will it take for them to contact patient or [place]?   In the meantime left sample at the front desk for patient. He is aware of all.

## 2018-03-22 NOTE — Telephone Encounter (Signed)
Call them to check on

## 2018-03-24 NOTE — Telephone Encounter (Signed)
Called to check and they state fax was not received so I refaxed Rx through [facility] fax

## 2018-03-29 ENCOUNTER — Ambulatory Visit: Payer: PRIVATE HEALTH INSURANCE | Admitting: Family Medicine

## 2018-03-29 ENCOUNTER — Encounter: Payer: Self-pay | Admitting: Family Medicine

## 2018-03-29 VITALS — BP 110/76 | HR 86 | Temp 98.0°F | Ht 69.0 in | Wt 187.8 lb

## 2018-03-29 DIAGNOSIS — H1132 Conjunctival hemorrhage, left eye: Secondary | ICD-10-CM | POA: Diagnosis not present

## 2018-03-29 NOTE — Progress Notes (Signed)
   Subjective:    Patient ID: Leonard Bentley, male    DOB: 1961-07-26, 57 y.o.   MRN: [id number]  HPI He woke up today and noted blood in his left eye medially.  No history of injury to the eye.  No other bruising noted.  He is on Coumadin.  No headache, blurred vision, double vision or weakness.  He states that his PT/INR numbers usually run between 2.4 and 3.5   Review of Systems     Objective:   Physical Exam Alert and in no distress.  Extraocular muscles intact.  Conjunctival hemorrhages noted medially in the eye.  Cornea is normal.  No purpuric lesions noted on his arms or face.       Assessment & Plan:  Subconjunctival hemorrhage of left eye I explained that this is probably spontaneous and not related to his Coumadin dosing.  Reassured him that this should go away on its own.

## 2018-03-30 ENCOUNTER — Ambulatory Visit: Payer: Self-pay | Admitting: Registered Nurse

## 2018-03-30 VITALS — BP 124/93 | HR 96 | Temp 97.1°F

## 2018-03-30 DIAGNOSIS — B360 Pityriasis versicolor: Secondary | ICD-10-CM

## 2018-03-30 DIAGNOSIS — H1132 Conjunctival hemorrhage, left eye: Secondary | ICD-10-CM

## 2018-03-30 MED ORDER — SALICYLIC ACID 3 % EX SHAM: 1.0000 | MEDICATED_SHAMPOO | Freq: Every day | CUTANEOUS | 0 refills | Status: DC | PRN

## 2018-03-30 NOTE — Patient Instructions (Signed)
Tinea Versicolor Tinea versicolor is a common fungal infection of the skin. It causes a rash that appears as light or dark patches on the skin. The rash most often occurs on the chest, back, neck, or upper arms. This condition is more common during warm weather. Other than affecting how your skin looks, tinea versicolor usually does not cause other problems. In most cases, the infection goes away in a few weeks with treatment. It may take a few months for the patches on your skin to clear up. What are the causes? Tinea versicolor occurs when a type of fungus that is normally present on the skin starts to overgrow. This fungus is a kind of yeast. The exact cause of the overgrowth is not known. This condition cannot be passed from one person to another (noncontagious). What increases the risk? This condition is more likely to develop when certain factors are present, such as:  Heat and humidity.  Sweating too much.  Hormone changes.  Oily skin.  A weak defense (immune) system.  What are the signs or symptoms? Symptoms of this condition may include:  A rash on your skin that is made up of light or dark patches. The rash may have: ? Patches of tan or pink spots on light skin. ? Patches of white or brown spots on dark skin. ? Patches of skin that do not tan. ? Well-marked edges. ? Scales on the discolored areas.  Mild itching.  How is this diagnosed? A health care provider can usually diagnose this condition by looking at your skin. During the exam, he or she may use ultraviolet light to help determine the extent of the infection. In some cases, a skin sample may be taken by scraping the rash. This sample will be viewed under a microscope to check for yeast overgrowth. How is this treated? Treatment for this condition may include:  Dandruff shampoo that is applied to the affected skin during showers or bathing.  Over-the-counter medicated skin cream, lotion, or soaps.  Prescription  antifungal medicine in the form of skin cream or pills.  Medicine to help reduce itching.  Follow these instructions at home:  Take medicines only as directed by your health care provider.  Apply dandruff shampoo to the affected area if told to do so by your health care provider. You may be instructed to scrub the affected skin for several minutes each [place].  Do not scratch the affected area of skin.  Avoid hot and humid conditions.  Do not use tanning booths.  Try to avoid sweating a lot. Contact a health care provider if:  Your symptoms get worse.  You have a fever.  You have redness, swelling, or pain at the site of your rash.  You have fluid, blood, or pus coming from your rash.  Your rash returns after treatment. This information is not intended to replace advice given to you by your health care provider. Make sure you discuss any questions you have with your health care provider. Document Released: 10/22/2000 Document Revised: 06/27/2016 Document Reviewed: 08/06/2014 Elsevier Interactive Patient Education  2018 [employer]  

## 2018-03-30 NOTE — Progress Notes (Signed)
Subjective:    Patient ID: Leonard Bentley, male    DOB: Jan 23, 1961, 57 y.o.   MRN: [id number]  57 y/o African-American established male pt c/o erythemic rash to chest and abd that he noticed this morning. Not itchy or painful. No new shampoos, lotions, foods. New body soap a few weeks ago, no issues until now. Was in heat and sweaty clothes yesterday and wonder if that is related to rash.  Put some hydrocortisone cream he had at home on rash and it worsened the next [place].  Also with subconjunctival hemorrhage to L eye that he woke up with yesterday. Saw pcp yesterday as well. On coumadin, INR within goal 2.5-3.5. Reports he did have a coughing fit the [place] before this occurred. Is nonpainful, not affecting vision.      Review of Systems  Constitutional: Negative for activity change, appetite change, chills, diaphoresis, fatigue, fever and unexpected weight change.  HENT: Negative for trouble swallowing and voice change.   Eyes: Positive for redness. Negative for photophobia, pain, discharge, itching and visual disturbance.  Respiratory: Positive for cough. Negative for shortness of breath, wheezing and stridor.   Cardiovascular: Negative for chest pain.  Gastrointestinal: Negative for diarrhea, nausea and vomiting.  Endocrine: Negative for cold intolerance and heat intolerance.  Genitourinary: Negative for difficulty urinating.  Musculoskeletal: Negative for arthralgias, back pain, gait problem, joint swelling, myalgias, neck pain and neck stiffness.  Skin: Positive for color change and rash. Negative for pallor and wound.  Allergic/Immunologic: Negative for environmental allergies and food allergies.  Neurological: Negative for dizziness, tremors, seizures, syncope, facial asymmetry, speech difficulty, weakness, light-headedness, numbness and headaches.  Hematological: Negative for adenopathy. Does not bruise/bleed easily.  Psychiatric/Behavioral: Negative for agitation, confusion and sleep  disturbance.       Objective:   Physical Exam  Constitutional: He is oriented to person, place, and time. Vital signs are normal. He appears well-developed and well-nourished. He is active and cooperative.  Non-toxic appearance. He does not have a sickly appearance. He does not appear ill. No distress.  HENT:  Head: Normocephalic and atraumatic.  Right Ear: Hearing and external ear normal.  Left Ear: Hearing and external ear normal.  Nose: Nose normal.  Mouth/Throat: Uvula is midline, oropharynx is clear and moist and mucous membranes are normal. No oropharyngeal exudate.  Eyes: Pupils are equal, round, and reactive to light. EOM and lids are normal. Right eye exhibits no chemosis, no discharge, no exudate and no hordeolum. No foreign body present in the right eye. Left eye exhibits no chemosis, no discharge, no exudate and no hordeolum. No foreign body present in the left eye. Right conjunctiva is not injected. Right conjunctiva has no hemorrhage. Left conjunctiva is not injected. Left conjunctiva has a hemorrhage. No scleral icterus. Right eye exhibits normal extraocular motion and no nystagmus. Left eye exhibits normal extraocular motion and no nystagmus. Right pupil is round and reactive. Left pupil is round and reactive. Pupils are equal.    Medial left subconjunctival hemorrhage not covering pupil or iris  Neck: Trachea normal, normal range of motion and phonation normal. Neck supple. No neck rigidity. No tracheal deviation, no edema, no erythema and normal range of motion present.  Cardiovascular: Normal rate, regular rhythm, normal heart sounds and intact distal pulses.  Pulmonary/Chest: Effort normal and breath sounds normal. No stridor. No respiratory distress. He has no decreased breath sounds. He has no wheezes. He has no rhonchi. He has no rales.  Abdominal: Soft. Normal appearance. He  exhibits no distension, no fluid wave and no ascites. There is no rigidity and no guarding.   Musculoskeletal: Normal range of motion. He exhibits no edema, tenderness or deformity.       Right shoulder: Normal.       Left shoulder: Normal.       Right elbow: Normal.      Left elbow: Normal.       Right hip: Normal.       Left hip: Normal.       Right knee: Normal.       Left knee: Normal.       Cervical back: Normal.       Right hand: Normal.       Left hand: Normal.  Lymphadenopathy:       Head (right side): No submental, no submandibular, no tonsillar, no preauricular, no posterior auricular and no occipital adenopathy present.       Head (left side): No submental, no submandibular, no tonsillar, no preauricular, no posterior auricular and no occipital adenopathy present.    He has no cervical adenopathy.       Right cervical: No superficial cervical, no deep cervical and no posterior cervical adenopathy present.      Left cervical: No superficial cervical, no deep cervical and no posterior cervical adenopathy present.  Neurological: He is alert and oriented to person, place, and time. He has normal strength. He is not disoriented. He displays no atrophy and no tremor. No cranial nerve deficit or sensory deficit. He exhibits normal muscle tone. He displays no seizure activity. Coordination and gait normal. GCS eye subscore is 4. GCS verbal subscore is 5. GCS motor subscore is 6.  In/out of chair; on/off exam table without difficulty; gait sure and steady in hallway  Skin: Skin is warm, dry and intact. Capillary refill takes less than 2 seconds. Rash noted. No abrasion, no bruising, no burn, no ecchymosis, no laceration, no petechiae and no purpura noted. Rash is macular, papular and maculopapular. Rash is not nodular, not pustular, not vesicular and not urticarial. He is not diaphoretic. No cyanosis or erythema. No pallor. Nails show no clubbing.     Grouped macular papular hyperpigmented lesions anterior upper chest not coalescing no erythema or discharge no induration no  fluctuance  Psychiatric: He has a normal mood and affect. His speech is normal and behavior is normal. Judgment and thought content normal. Cognition and memory are normal.  Nursing note and vitals reviewed.    Chest macular hyperpgimented anterior from armpit to armpit to rib line.      Assessment & Plan:  A-tinea versicolor chest; subconjunctival hemorrhage left  P-Apply selsum blue shampoo daily in shower; change out shirt mid Howerter with new shirt if sweat soaked. Medication as directed. Call or return to clinic as needed if these symptoms worsen or fail to improve as anticipated. Exitcare handout on tinea versicolor printed and given to patient. Discussed hygiene with patient e.g. do not stay in sweat soaked clothes and shower with selsun blue shampoo prn. Reoccurrence of condition common and may require retreatment. Do not apply hydrocortisone to rash as it could worsen pain/spread/itching.  May use blow dryer to help dry skin thoroughly after shower  Consider oral meds if no improvement with topical and hygiene changes.  Patient verbalized agreement and understanding of treatment plan and had no further questions at this time.  P2: Avoidance and hand washing.   If vision changes, bleeding from eye seek follow up evaluation.  Patient verbalized understanding information/instructions and had no further questions at this time.

## 2018-04-04 HISTORY — PX: SKIN BIOPSY: SHX1

## 2018-04-10 ENCOUNTER — Encounter: Payer: Self-pay | Admitting: Family Medicine

## 2018-04-11 ENCOUNTER — Ambulatory Visit (INDEPENDENT_AMBULATORY_CARE_PROVIDER_SITE_OTHER): Payer: PRIVATE HEALTH INSURANCE | Admitting: Orthopaedic Surgery

## 2018-04-14 ENCOUNTER — Ambulatory Visit (INDEPENDENT_AMBULATORY_CARE_PROVIDER_SITE_OTHER): Payer: No Typology Code available for payment source | Admitting: Pharmacist

## 2018-04-14 DIAGNOSIS — Z9889 Other specified postprocedural states: Secondary | ICD-10-CM

## 2018-04-14 DIAGNOSIS — Z86711 Personal history of pulmonary embolism: Secondary | ICD-10-CM

## 2018-04-14 DIAGNOSIS — Z7901 Long term (current) use of anticoagulants: Secondary | ICD-10-CM

## 2018-04-14 LAB — POCT INR: INR: 3 (ref 2.0–3.0)

## 2018-04-14 MED ORDER — WARFARIN SODIUM 5 MG PO TABS: ORAL_TABLET | ORAL | 0 refills | Status: DC

## 2018-04-18 ENCOUNTER — Ambulatory Visit (INDEPENDENT_AMBULATORY_CARE_PROVIDER_SITE_OTHER): Payer: PRIVATE HEALTH INSURANCE | Admitting: Orthopaedic Surgery

## 2018-04-24 ENCOUNTER — Other Ambulatory Visit: Payer: Self-pay

## 2018-04-24 ENCOUNTER — Emergency Department (HOSPITAL_COMMUNITY): Payer: No Typology Code available for payment source

## 2018-04-24 ENCOUNTER — Emergency Department (HOSPITAL_COMMUNITY)
Admission: EM | Admit: 2018-04-24 | Discharge: 2018-04-24 | Disposition: A | Discharge status: Home / Self Care | Payer: No Typology Code available for payment source | Attending: Emergency Medicine | Admitting: Emergency Medicine

## 2018-04-24 ENCOUNTER — Encounter (HOSPITAL_COMMUNITY): Payer: Self-pay | Admitting: *Deleted

## 2018-04-24 DIAGNOSIS — I10 Essential (primary) hypertension: Secondary | ICD-10-CM | POA: Insufficient documentation

## 2018-04-24 DIAGNOSIS — Z7901 Long term (current) use of anticoagulants: Secondary | ICD-10-CM | POA: Insufficient documentation

## 2018-04-24 DIAGNOSIS — Z79899 Other long term (current) drug therapy: Secondary | ICD-10-CM | POA: Insufficient documentation

## 2018-04-24 DIAGNOSIS — R361 Hematospermia: Secondary | ICD-10-CM

## 2018-04-24 DIAGNOSIS — Z87891 Personal history of nicotine dependence: Secondary | ICD-10-CM | POA: Insufficient documentation

## 2018-04-24 LAB — URINALYSIS, ROUTINE W REFLEX MICROSCOPIC
Bilirubin Urine: NEGATIVE
Glucose, UA: NEGATIVE mg/dL
Ketones, ur: NEGATIVE mg/dL
Nitrite: NEGATIVE
Protein, ur: 100 mg/dL — AB
Specific Gravity, Urine: 1.023 (ref 1.005–1.030)
pH: 6 (ref 5.0–8.0)

## 2018-04-24 LAB — CBC
HCT: 38.8 % — ABNORMAL LOW (ref 39.0–52.0)
Hemoglobin: 13 g/dL (ref 13.0–17.0)
MCH: 25.4 pg — ABNORMAL LOW (ref 26.0–34.0)
MCHC: 33.5 g/dL (ref 30.0–36.0)
MCV: 75.8 fL — ABNORMAL LOW (ref 78.0–100.0)
Platelets: 182 10*3/uL (ref 150–400)
RBC: 5.12 MIL/uL (ref 4.22–5.81)
RDW: 16.7 % — ABNORMAL HIGH (ref 11.5–15.5)
WBC: 2.8 10*3/uL — ABNORMAL LOW (ref 4.0–10.5)

## 2018-04-24 LAB — BASIC METABOLIC PANEL
Anion gap: 14 (ref 5–15)
BUN: 11 mg/dL (ref 6–20)
CO2: 24 mmol/L (ref 22–32)
Calcium: 8.9 mg/dL (ref 8.9–10.3)
Chloride: 100 mmol/L — ABNORMAL LOW (ref 101–111)
Creatinine, Ser: 0.9 mg/dL (ref 0.61–1.24)
GFR calc Af Amer: >60 mL/min (ref >60)
GFR calc non Af Amer: >60 mL/min (ref >60)
Glucose, Bld: 104 mg/dL — ABNORMAL HIGH (ref 65–99)
Potassium: 4.4 mmol/L (ref 3.5–5.1)
Sodium: 138 mmol/L (ref 135–145)

## 2018-04-24 LAB — PROTIME-INR
INR: 3.01
Prothrombin Time: 31 seconds — ABNORMAL HIGH (ref 11.4–15.2)

## 2018-04-24 MED ORDER — DOXYCYCLINE HYCLATE 100 MG PO CAPS: 100.0000 mg | ORAL_CAPSULE | Freq: Two times a day (BID) | ORAL | 0 refills | Status: AC

## 2018-04-24 MED ORDER — MECLIZINE HCL 25 MG PO TABS: 25.0000 mg | ORAL_TABLET | Freq: Three times a day (TID) | ORAL | 0 refills | Status: DC | PRN

## 2018-04-24 NOTE — Discharge Planning (Signed)
[facility] consulted regarding uninsured pt.  [facility] met with pt at bedside to discuss orange card procedure and placed resources on AVS.

## 2018-04-24 NOTE — ED Notes (Signed)
Urinal at bedside.  

## 2018-04-24 NOTE — ED Provider Notes (Signed)
[facility] [place] [facility] EMERGENCY DEPARTMENT Provider Note   CSN: [id number] Arrival date & time: 04/24/18  8850     History   Chief Complaint Chief Complaint  Patient presents with  . Hematuria  . Dizziness    HPI Leonard Bentley is a 57 y.o. male with a h/o of PE on coumadin (goal 2.0-3.0), mitral valve regurgitation s/p minimally invasive mitral valve repair, and non-Hodgkin lymphoma in remission since 20012, and depression who presents to the emergency department with a chief complaint of hematospermia with associated dizziness and lightheadedness.   He endorses painless bleeding after ejaculation that has progressively worsened since onset 4 days ago. He noticed a faint pink discoloration for the last 3 days that became red this morning.   He denies fever, chills, back pain, hematuria, dysuria, urinary frequency or hesitancy, abdominal pain, melena, hematochezia, penile or testicular pain or swelling, penile discharge, chest pain, dyspnea, headaches, or syncope.  He reports associated intermittent dizziness, which he describes as feeling off balance.  He reports this is been present since he had chemotherapy several years ago, but has gradually gotten worse.  He denies tinnitus, otalgia, headache, visual changes  He denies recent falls, injuries, or trauma to the GU area.   He ejaculates 2-3, on average, per [place]. No history or similar. He reports that he drank a bottle of wine last night.  He reports that he will typically drink a bottle of wine nightly on the weekends, but does not drink for the week.  No recent increase in alcohol use.  He reports that he lost his job last week and no longer has insurance coverage.  The history is provided by the patient. No language interpreter was used.  Dizziness  Associated symptoms: no blood in stool, no chest pain, no diarrhea, no headaches, no nausea, no shortness of breath, no vomiting and no weakness     Past Medical History:    Diagnosis Date  . Acute pulmonary embolism ([place]) 04/11/2016  . Anxiety   . Arthritis   . Atrial tachycardia ([place]) 01/06/2016  . Cancer ([place]) 07/2011   STAGE II NON- HODGKIN LYMPHOMA  . Depression   . Dyspnea   . ED (erectile dysfunction)   . Hypertension   . Lymphoma, non Hodgkin's 09/25/2011  . Mitral regurgitation 01/20/2016  . Mobitz I 10/09/2015  . Murmur 01/06/2016  . Occasional tremors   . PAC (premature atrial contraction) 10/09/2015  . S/P minimally-invasive mitral valve repair 10/27/2016   Complex valvuloplasty including artificial Gore-tex neochord placement x6 and 34 mm Edwards Physio II ring annuloplasty via right mini thoracotomy approach    Patient Active Problem List   Diagnosis Date Noted  . Posterior tibial tendon dysfunction 02/21/2018  . Elevated INR 11/27/2016  . Dehydration 11/27/2016  . SVT (supraventricular tachycardia) ([place])   . S/P minimally-invasive mitral valve repair 10/27/2016  . History of pulmonary embolus (PE) 04/11/2016  . Left sided numbness   . Anxiety disorder 04/02/2016  . Panic attacks 04/02/2016  . Mitral valve prolapse   . Mitral regurgitation 01/20/2016  . Atrial tachycardia ([place]) 01/06/2016  . Murmur 01/06/2016  . Mobitz I 10/09/2015  . PAC (premature atrial contraction) 10/09/2015  . Arthritis 08/14/2015  . Neuropathy due to chemotherapeutic drug ([place]) 03/09/2013  . Leucopenia 01/31/2013  . Personal history of pulmonary embolism 12/28/2012  . Alcohol abuse 08/24/2012  . Cigarette smoker 08/24/2012  . Hemorrhoid 03/08/2012  . Anemia 12/10/2011  . History of non-Hodgkin's lymphoma 09/25/2011  Past Surgical History:  Procedure Laterality Date  . CARDIAC CATHETERIZATION N/A 08/13/2016   Procedure: Right/Left Heart Cath and Coronary Angiography;  Surgeon: [name], MD;  Location: [facility];  Service: Cardiovascular;  Laterality: N/A;  . MITRAL VALVE REPAIR Right 10/27/2016   Procedure: MINIMALLY INVASIVE  MITRAL VALVE REPAIR (MVR) USING 34 PHYSIO II ANNULOPLASTY RING;  Surgeon: [name], MD;  Location: [facility];  Service: Open Heart Surgery;  Laterality: Right;  . NO PAST SURGERIES    . SKIN BIOPSY Right 04/04/2018   right mid medial anterior tibial shave  see report in chart  . TEE WITHOUT CARDIOVERSION N/A 02/11/2016   Procedure: TRANSESOPHAGEAL ECHOCARDIOGRAM (TEE);  Surgeon: [name], MD;  Location: [facility];  Service: Cardiovascular;  Laterality: N/A;  . TEE WITHOUT CARDIOVERSION N/A 10/27/2016   Procedure: TRANSESOPHAGEAL ECHOCARDIOGRAM (TEE);  Surgeon: [name], MD;  Location: [facility];  Service: Open Heart Surgery;  Laterality: N/A;  . TRIGGER FINGER RELEASE Bilateral         Home Medications    Prior to Admission medications   Medication Sig Start Date End Date Taking? Authorizing Provider  diclofenac sodium (VOLTAREN) 1 % GEL Apply two grams. Use four times daily for pain Patient taking differently: Apply 1 g topically 2 (two) times daily as needed (for pain).  02/21/18  Yes Aundra Dubin, PA-C  POTASSIUM PO Take 3 tablets by mouth daily.   Yes [provider]  warfarin (COUMADIN) 5 MG tablet Take 1 tablet by mouth daily or as directed by coumadin clinic Patient taking differently: Take 5 mg by mouth daily. clinic 04/14/18  Yes [name], MD  doxycycline (VIBRAMYCIN) 100 MG capsule Take 1 capsule (100 mg total) by mouth 2 (two) times daily for 10 days. 04/24/18 05/04/18  Joniel Graumann A, PA-C  meclizine (ANTIVERT) 25 MG tablet Take 1 tablet (25 mg total) by mouth 3 (three) times daily as needed for dizziness. 04/24/18   Jahvier Aldea A, PA-C  triamcinolone cream (KENALOG) 0.1 % Apply 1 application topically 2 (two) times daily. Patient not taking: Reported on 02/21/2018 02/13/18   Tysinger, Camelia Eng, PA-C    Family History Family History  Problem Relation Age of Onset  . Cancer Mother        BREAST(BONE)  . Cancer Father        PANCREATIC  .  Hypertension Maternal Grandmother   . Stroke Maternal Aunt   . Heart attack Neg Hx     Social History Social History   Tobacco Use  . Smoking status: Former Smoker    Packs/Suit: 1.00    Years: 32.00    Pack years: 32.00    Types: Cigarettes, E-cigarettes    Last attempt to quit: 07/06/2013    Years since quitting: 4.8  . Smokeless tobacco: Current User  . Tobacco comment: smokes vapor cig. 3 times [place]  Substance Use Topics  . Alcohol use: Yes    Alcohol/week: 0.0 oz    Comment:  1/2 bottle of wine a [place]  . Drug use: No     Allergies   Patient has no known allergies.   Review of Systems Review of Systems  Constitutional: Negative for appetite change and fever.  HENT: Negative for congestion, postnasal drip and sore throat.   Eyes: Negative for visual disturbance.  Respiratory: Negative for shortness of breath.   Cardiovascular: Negative for chest pain.  Gastrointestinal: Negative for abdominal distention, abdominal pain, anal bleeding, blood  in stool, constipation, diarrhea, nausea, rectal pain and vomiting.  Endocrine: Negative for polyuria.  Genitourinary: Negative for difficulty urinating, dysuria, frequency, hematuria, penile pain, penile swelling, scrotal swelling, testicular pain and urgency.       Hematospermia   Musculoskeletal: Negative for back pain.  Skin: Negative for rash.  Allergic/Immunologic: Negative for immunocompromised state.  Neurological: Positive for dizziness. Negative for syncope, weakness, light-headedness and headaches.  Hematological: Bruises/bleeds easily.  Psychiatric/Behavioral: Negative for confusion.   Physical Exam Updated Vital Signs BP (!) 136/105 (BP Location: Right Arm)   Pulse 93   Temp 98.4 F (36.9 C) (Oral)   Resp 18   SpO2 97%   Physical Exam  Constitutional: He appears well-developed.  HENT:  Head: Normocephalic.  Eyes: Conjunctivae are normal.  Neck: Neck supple.  Cardiovascular: Normal rate, regular rhythm and  intact distal pulses. Exam reveals no gallop and no friction rub.  Murmur heard. Pulmonary/Chest: Effort normal and breath sounds normal. No stridor. No respiratory distress. He has no wheezes. He has no rales. He exhibits no tenderness.  Abdominal: Soft. Bowel sounds are normal. He exhibits no distension and no mass. There is no tenderness. There is no rebound and no guarding. No hernia.  Abdomen is soft, nondistended.  No CVA tenderness bilaterally.  Genitourinary: Rectal exam shows external hemorrhoid. Rectal exam shows no fissure, no mass, no tenderness and anal tone normal.  Genitourinary Comments: Chaperoned exam.  There is one non-thrombosed external hemorrhoid in the 6-8 o'clock area. Prostate not able to be palpated on exam. Light brown stool noted.   Neurological: He is alert.  Skin: Skin is warm and dry. Capillary refill takes less than 2 seconds. No erythema.  Psychiatric: His behavior is normal.  Nursing note and vitals reviewed.    ED Treatments / Results  Labs (all labs ordered are listed, but only abnormal results are displayed) Labs Reviewed  BASIC METABOLIC PANEL - Abnormal; Notable for the following components:      Result Value   Chloride 100 (*)    Glucose, Bld 104 (*)    All other components within normal limits  CBC - Abnormal; Notable for the following components:   WBC 2.8 (*)    HCT 38.8 (*)    MCV 75.8 (*)    MCH 25.4 (*)    RDW 16.7 (*)    All other components within normal limits  URINALYSIS, ROUTINE W REFLEX MICROSCOPIC - Abnormal; Notable for the following components:   APPearance HAZY (*)    Hgb urine dipstick SMALL (*)    Protein, ur 100 (*)    Leukocytes, UA TRACE (*)    Bacteria, UA RARE (*)    All other components within normal limits  PROTIME-INR - Abnormal; Notable for the following components:   Prothrombin Time 31.0 (*)    All other components within normal limits  URINE CULTURE    EKG None  Radiology US Scrotum  W/doppler  Result Date: 04/24/2018 CLINICAL DATA:  Bleeding after ejaculation. EXAM: SCROTAL ULTRASOUND DOPPLER ULTRASOUND OF THE TESTICLES TECHNIQUE: Complete ultrasound examination of the testicles, epididymis, and other scrotal structures was performed. Color and spectral Doppler ultrasound were also utilized to evaluate blood flow to the testicles. COMPARISON:  None. FINDINGS: Right testicle Measurements: 4.4 x 2.3 x 3.0 cm. No mass or microlithiasis visualized. Left testicle Measurements: 4.5 x 2.0 x 2.6 cm. No mass or microlithiasis visualized. Right epididymis: Several small echogenic foci in the epididymal head without increased epididymal vascularity. Left epididymis:  Normal in size and appearance. Hydrocele:  None visualized. Varicocele:  None visualized. Pulsed Doppler interrogation of both testes demonstrates normal low resistance arterial and venous waveforms bilaterally. IMPRESSION: 1. No acute abnormality identified. 2. Small calcifications in the right epididymal head, nonspecific but can be seen with chronic epididymitis and prior trauma. Electronically Signed   By: Logan Bores M.D.   On: 04/24/2018 08:26    Procedures Procedures (including critical care time)  Medications Ordered in ED Medications - No data to display   Initial Impression / Assessment and Plan / ED Course  I have reviewed the triage vital signs and the nursing notes.  Pertinent labs & imaging results that were available during my care of the patient were reviewed by me and considered in my medical decision making (see chart for details).     57 year old male with a h/o of PE on coumadin (goal 2.0-3.0), mitral valve regurgitation s/p minimally invasive mitral valve repair, and non-Hodgkin lymphoma in remission since 20012, and depression who presents to the emergency department with a chief complaint of hematospermia x4 days.  Differential diagnosis includes hemorrhage, infection, and malignancy.  The  patient was discussed with Dr. Betsey Holiday, attending physician.  On physical exam, prostate is not able to be adequately examined.  No edema, warmth, redness to the penis or scrotum.  Normal scrotum exam.   Scrotal ultrasound with small calcifications in the right epididymal head, which can be seen with chronic epididymis and prior trauma.  Labs are notable for UA with hemoglobinuria, mild proteinuria, and leukocyte esterase.  Urine culture sent.  INR is therapeutic at 3.01.  Will cover the patient for possible early prostatitis.  Spoke with Nicole Kindred from pharmacy who recommended doxycycline given that the patient is on Coumadin.  He has been given a referral to urology and strict return precautions to the ED.  He also reports that he was terminated from his job last week and no longer has medical insurance.  Case management consult placed.    The patient is otherwise hemodynamically stable and in no acute distress.  He is safe for discharge to home with outpatient follow-up at this time.  Final Clinical Impressions(s) / ED Diagnoses   Final diagnoses:  Hematospermia    ED Discharge Orders        Ordered    meclizine (ANTIVERT) 25 MG tablet  3 times daily PRN     04/24/18 1029    doxycycline (VIBRAMYCIN) 100 MG capsule  2 times daily     04/24/18 1029       Laveda Demedeiros A, PA-C 04/24/18 1707    Orpah Greek, MD 04/25/18 [phone]

## 2018-04-24 NOTE — Discharge Instructions (Addendum)
Thank you for allowing me to provide your care today in the emergency department.  There are many different causes of bleeding after ejaculation.  Sometimes this can happen after an early infection.  Take 1 tablet of doxycycline 2 times daily for the next 10 days.  It is important that you keep close follow-up with the Coumadin clinic as this medication can sometimes cause your Coumadin levels to fluctuate.  Sometimes you may need to be on a longer course of doxycycline so it is important follow-up.  Take 1 tablet of meclizine every 8 hours as needed for dizziness.  Please call and schedule a follow-up appointment with urology.  This is Dr. Jackson Latino office.   Use the resources provided by case management to get established with primary care.  Return to the emergency department if you develop new or worsening symptoms including blood in your urine or stool, lightheadedness, shortness of breath, fever, chills, or other new concerning symptoms.

## 2018-04-24 NOTE — ED Notes (Signed)
Patient transported to Ultrasound 

## 2018-04-24 NOTE — ED Triage Notes (Signed)
Pt arrived by EMS for hematuria and dizziness for the past 4 days. Reports dark red blood, more after ejaculation. Dizziness worse with ambulation, is on coumadin, last INR 2 weeks ago and was at 3 per pt. Also c/o feeling shaky. Drinks a bottle of wine per night, last drink was last night. EMS reported normal orthostatics

## 2018-04-25 LAB — URINE CULTURE: Culture: <10000 — AB

## 2018-06-04 ENCOUNTER — Inpatient Hospital Stay (HOSPITAL_COMMUNITY)
Admission: EM | Admit: 2018-06-04 | Discharge: 2018-06-12 | DRG: 896 | Disposition: A | Discharge status: Home Health | Payer: Self-pay | Attending: Family Medicine | Admitting: Family Medicine

## 2018-06-04 ENCOUNTER — Encounter (HOSPITAL_COMMUNITY): Payer: Self-pay

## 2018-06-04 ENCOUNTER — Other Ambulatory Visit: Payer: Self-pay

## 2018-06-04 DIAGNOSIS — K7010 Alcoholic hepatitis without ascites: Secondary | ICD-10-CM | POA: Diagnosis present

## 2018-06-04 DIAGNOSIS — I480 Paroxysmal atrial fibrillation: Secondary | ICD-10-CM | POA: Diagnosis present

## 2018-06-04 DIAGNOSIS — R7989 Other specified abnormal findings of blood chemistry: Secondary | ICD-10-CM

## 2018-06-04 DIAGNOSIS — H6122 Impacted cerumen, left ear: Secondary | ICD-10-CM | POA: Diagnosis present

## 2018-06-04 DIAGNOSIS — F10231 Alcohol dependence with withdrawal delirium: Principal | ICD-10-CM | POA: Diagnosis present

## 2018-06-04 DIAGNOSIS — F17290 Nicotine dependence, other tobacco product, uncomplicated: Secondary | ICD-10-CM | POA: Diagnosis present

## 2018-06-04 DIAGNOSIS — R0609 Other forms of dyspnea: Secondary | ICD-10-CM

## 2018-06-04 DIAGNOSIS — R945 Abnormal results of liver function studies: Secondary | ICD-10-CM

## 2018-06-04 DIAGNOSIS — Z7901 Long term (current) use of anticoagulants: Secondary | ICD-10-CM

## 2018-06-04 DIAGNOSIS — Z8572 Personal history of non-Hodgkin lymphomas: Secondary | ICD-10-CM

## 2018-06-04 DIAGNOSIS — R443 Hallucinations, unspecified: Secondary | ICD-10-CM | POA: Diagnosis present

## 2018-06-04 DIAGNOSIS — F1010 Alcohol abuse, uncomplicated: Secondary | ICD-10-CM | POA: Diagnosis present

## 2018-06-04 DIAGNOSIS — Z8249 Family history of ischemic heart disease and other diseases of the circulatory system: Secondary | ICD-10-CM

## 2018-06-04 DIAGNOSIS — R0600 Dyspnea, unspecified: Secondary | ICD-10-CM

## 2018-06-04 DIAGNOSIS — I110 Hypertensive heart disease with heart failure: Secondary | ICD-10-CM | POA: Diagnosis present

## 2018-06-04 DIAGNOSIS — Z86711 Personal history of pulmonary embolism: Secondary | ICD-10-CM

## 2018-06-04 DIAGNOSIS — F10939 Alcohol use, unspecified with withdrawal, unspecified: Secondary | ICD-10-CM | POA: Diagnosis present

## 2018-06-04 DIAGNOSIS — F10931 Alcohol use, unspecified with withdrawal delirium: Secondary | ICD-10-CM

## 2018-06-04 DIAGNOSIS — M76829 Posterior tibial tendinitis, unspecified leg: Secondary | ICD-10-CM

## 2018-06-04 DIAGNOSIS — Z952 Presence of prosthetic heart valve: Secondary | ICD-10-CM

## 2018-06-04 DIAGNOSIS — R42 Dizziness and giddiness: Secondary | ICD-10-CM

## 2018-06-04 DIAGNOSIS — R3129 Other microscopic hematuria: Secondary | ICD-10-CM | POA: Diagnosis present

## 2018-06-04 DIAGNOSIS — C8590 Non-Hodgkin lymphoma, unspecified, unspecified site: Secondary | ICD-10-CM | POA: Diagnosis present

## 2018-06-04 DIAGNOSIS — F419 Anxiety disorder, unspecified: Secondary | ICD-10-CM | POA: Diagnosis present

## 2018-06-04 DIAGNOSIS — I5042 Chronic combined systolic (congestive) and diastolic (congestive) heart failure: Secondary | ICD-10-CM | POA: Diagnosis present

## 2018-06-04 DIAGNOSIS — F10239 Alcohol dependence with withdrawal, unspecified: Secondary | ICD-10-CM | POA: Diagnosis present

## 2018-06-04 DIAGNOSIS — D61818 Other pancytopenia: Secondary | ICD-10-CM | POA: Diagnosis present

## 2018-06-04 DIAGNOSIS — R319 Hematuria, unspecified: Secondary | ICD-10-CM

## 2018-06-04 DIAGNOSIS — G9341 Metabolic encephalopathy: Secondary | ICD-10-CM | POA: Diagnosis present

## 2018-06-04 DIAGNOSIS — Z560 Unemployment, unspecified: Secondary | ICD-10-CM

## 2018-06-04 DIAGNOSIS — F329 Major depressive disorder, single episode, unspecified: Secondary | ICD-10-CM | POA: Diagnosis present

## 2018-06-04 DIAGNOSIS — Z9889 Other specified postprocedural states: Secondary | ICD-10-CM

## 2018-06-04 DIAGNOSIS — I2699 Other pulmonary embolism without acute cor pulmonale: Secondary | ICD-10-CM

## 2018-06-04 DIAGNOSIS — Z79899 Other long term (current) drug therapy: Secondary | ICD-10-CM

## 2018-06-04 DIAGNOSIS — R9431 Abnormal electrocardiogram [ECG] [EKG]: Secondary | ICD-10-CM

## 2018-06-04 LAB — URINALYSIS, ROUTINE W REFLEX MICROSCOPIC
Bacteria, UA: NONE SEEN
Bilirubin Urine: NEGATIVE
Glucose, UA: NEGATIVE mg/dL
Ketones, ur: NEGATIVE mg/dL
Leukocytes, UA: NEGATIVE
Nitrite: NEGATIVE
Protein, ur: 100 mg/dL — AB
RBC / HPF: >50 RBC/hpf — ABNORMAL HIGH (ref 0–5)
Specific Gravity, Urine: 1.025 (ref 1.005–1.030)
pH: 6 (ref 5.0–8.0)

## 2018-06-04 LAB — I-STAT TROPONIN, ED: Troponin i, poc: 0.01 ng/mL (ref 0.00–0.08)

## 2018-06-04 LAB — CBC
HCT: 36.6 % — ABNORMAL LOW (ref 39.0–52.0)
Hemoglobin: 12.4 g/dL — ABNORMAL LOW (ref 13.0–17.0)
MCH: 26.1 pg (ref 26.0–34.0)
MCHC: 33.9 g/dL (ref 30.0–36.0)
MCV: 76.9 fL — ABNORMAL LOW (ref 78.0–100.0)
Platelets: 111 10*3/uL — ABNORMAL LOW (ref 150–400)
RBC: 4.76 MIL/uL (ref 4.22–5.81)
RDW: 17.8 % — ABNORMAL HIGH (ref 11.5–15.5)
WBC: 3.3 10*3/uL — ABNORMAL LOW (ref 4.0–10.5)

## 2018-06-04 LAB — HEPATIC FUNCTION PANEL
ALT: 110 U/L — ABNORMAL HIGH (ref 0–44)
AST: 172 U/L — ABNORMAL HIGH (ref 15–41)
Albumin: 3.7 g/dL (ref 3.5–5.0)
Alkaline Phosphatase: 66 U/L (ref 38–126)
Bilirubin, Direct: 0.3 mg/dL — ABNORMAL HIGH (ref 0.0–0.2)
Indirect Bilirubin: 0.9 mg/dL (ref 0.3–0.9)
Total Bilirubin: 1.2 mg/dL (ref 0.3–1.2)
Total Protein: 7.1 g/dL (ref 6.5–8.1)

## 2018-06-04 LAB — BASIC METABOLIC PANEL
Anion gap: 15 (ref 5–15)
BUN: 9 mg/dL (ref 6–20)
CO2: 22 mmol/L (ref 22–32)
Calcium: 8.6 mg/dL — ABNORMAL LOW (ref 8.9–10.3)
Chloride: 102 mmol/L (ref 98–111)
Creatinine, Ser: 0.89 mg/dL (ref 0.61–1.24)
GFR calc Af Amer: >60 mL/min (ref >60)
GFR calc non Af Amer: >60 mL/min (ref >60)
Glucose, Bld: 91 mg/dL (ref 70–99)
Potassium: 4.1 mmol/L (ref 3.5–5.1)
Sodium: 139 mmol/L (ref 135–145)

## 2018-06-04 LAB — CBG MONITORING, ED: Glucose-Capillary: 92 mg/dL (ref 70–99)

## 2018-06-04 LAB — CK: Total CK: 262 U/L (ref 49–397)

## 2018-06-04 LAB — PROTIME-INR
INR: 3.73
Prothrombin Time: 36.6 seconds — ABNORMAL HIGH (ref 11.4–15.2)

## 2018-06-04 LAB — LIPASE, BLOOD: Lipase: 59 U/L — ABNORMAL HIGH (ref 11–51)

## 2018-06-04 LAB — ETHANOL: Alcohol, Ethyl (B): 15 mg/dL — ABNORMAL HIGH (ref <10)

## 2018-06-04 LAB — TROPONIN I: Troponin I: <0.03 ng/mL (ref <0.03)

## 2018-06-04 LAB — POC OCCULT BLOOD, ED: Fecal Occult Bld: NEGATIVE

## 2018-06-04 MED ORDER — ONDANSETRON HCL 4 MG PO TABS: 4.0000 mg | ORAL_TABLET | Freq: Four times a day (QID) | ORAL | Status: DC | PRN

## 2018-06-04 MED ORDER — ONDANSETRON HCL 4 MG/2ML IJ SOLN: 4.0000 mg | Freq: Four times a day (QID) | INTRAMUSCULAR | Status: DC | PRN

## 2018-06-04 MED ORDER — LORAZEPAM 2 MG/ML IJ SOLN
1.0000 mg | Freq: Once | INTRAMUSCULAR | Status: AC
  Administered 2018-06-04: 1 mg via INTRAVENOUS
  Filled 2018-06-04: qty 1

## 2018-06-04 MED ORDER — LORAZEPAM 1 MG PO TABS: 0.0000 mg | ORAL_TABLET | Freq: Two times a day (BID) | ORAL | Status: DC

## 2018-06-04 MED ORDER — LORAZEPAM 2 MG/ML IJ SOLN
1.0000 mg | Freq: Once | INTRAMUSCULAR | Status: AC
  Administered 2018-06-04: 1 mg via INTRAVENOUS

## 2018-06-04 MED ORDER — VITAMIN B-1 100 MG PO TABS
100.0000 mg | ORAL_TABLET | Freq: Every day | ORAL | Status: DC
  Administered 2018-06-05 – 2018-06-12 (×7): 100 mg via ORAL
  Filled 2018-06-04 (×7): qty 1

## 2018-06-04 MED ORDER — FOLIC ACID 1 MG PO TABS
1.0000 mg | ORAL_TABLET | Freq: Every day | ORAL | Status: DC
  Administered 2018-06-05 – 2018-06-12 (×7): 1 mg via ORAL
  Filled 2018-06-04 (×8): qty 1

## 2018-06-04 MED ORDER — THIAMINE HCL 100 MG/ML IJ SOLN
100.0000 mg | Freq: Every day | INTRAMUSCULAR | Status: DC
  Administered 2018-06-06: 100 mg via INTRAVENOUS
  Filled 2018-06-04 (×2): qty 2

## 2018-06-04 MED ORDER — LORAZEPAM 1 MG PO TABS: 1.0000 mg | ORAL_TABLET | Freq: Four times a day (QID) | ORAL | Status: DC | PRN

## 2018-06-04 MED ORDER — SODIUM CHLORIDE 0.9 % IV BOLUS
1000.0000 mL | Freq: Once | INTRAVENOUS | Status: AC
  Administered 2018-06-04: 1000 mL via INTRAVENOUS

## 2018-06-04 MED ORDER — LORAZEPAM 2 MG/ML IJ SOLN
1.0000 mg | Freq: Once | INTRAMUSCULAR | Status: AC
Start: 2018-06-04 — End: 2018-06-04
  Administered 2018-06-04: 1 mg via INTRAVENOUS
  Filled 2018-06-04: qty 1

## 2018-06-04 MED ORDER — ONDANSETRON HCL 4 MG/2ML IJ SOLN
4.0000 mg | Freq: Once | INTRAMUSCULAR | Status: AC
  Administered 2018-06-04: 4 mg via INTRAVENOUS
  Filled 2018-06-04: qty 2

## 2018-06-04 MED ORDER — ADULT MULTIVITAMIN W/MINERALS CH
1.0000 | ORAL_TABLET | Freq: Every day | ORAL | Status: DC
  Administered 2018-06-05 – 2018-06-12 (×7): 1 via ORAL
  Filled 2018-06-04 (×7): qty 1

## 2018-06-04 MED ORDER — SODIUM CHLORIDE 0.9 % IV BOLUS
500.0000 mL | Freq: Once | INTRAVENOUS | Status: AC
  Administered 2018-06-04: 500 mL via INTRAVENOUS

## 2018-06-04 MED ORDER — LORAZEPAM 2 MG/ML IJ SOLN
1.0000 mg | Freq: Four times a day (QID) | INTRAMUSCULAR | Status: DC | PRN
  Administered 2018-06-06: 1 mg via INTRAVENOUS
  Filled 2018-06-04: qty 1

## 2018-06-04 MED ORDER — SODIUM CHLORIDE 0.9 % IV SOLN
INTRAVENOUS | Status: AC
  Administered 2018-06-04 – 2018-06-05 (×2): via INTRAVENOUS

## 2018-06-04 MED ORDER — DICLOFENAC SODIUM 1 % TD GEL
2.0000 g | Freq: Four times a day (QID) | TRANSDERMAL | Status: DC | PRN
  Filled 2018-06-04: qty 100

## 2018-06-04 MED ORDER — LORAZEPAM 1 MG PO TABS
0.0000 mg | ORAL_TABLET | Freq: Four times a day (QID) | ORAL | Status: DC
  Administered 2018-06-04 – 2018-06-05 (×4): 1 mg via ORAL
  Filled 2018-06-04: qty 2
  Filled 2018-06-04 (×4): qty 1

## 2018-06-04 NOTE — ED Notes (Signed)
While doing Orthostatic Vitals on Pt he stated he was weak and dizzy while standing. Pt still continuing to shake and is having to hold on to a table to hold himself up. Did not keep Pt standing longer than his 1st standing orthostatic

## 2018-06-04 NOTE — Progress Notes (Signed)
Patient arrived to unit, no complaints of pain. Still with tremors and states he thinks it is due to anxiety and he's had this happen before. Patient A&O x4, no n/v or pain.  Cardiac monitoring intiated and verified, call bell in reach, patient updated on plan of care. MD will be in to see patient. Pharmacy notified of coumadin and home coumadin taken down to pharmacy.

## 2018-06-04 NOTE — ED Provider Notes (Signed)
Patient increasingly tremulous and tachycardic.  Starting to have sensory hallucinations.  Started on CIWA protocol.  Hospitalist to admit.   Julianne Rice, MD 06/04/18 2217

## 2018-06-04 NOTE — ED Notes (Signed)
oob to bathroom via wc.

## 2018-06-04 NOTE — ED Notes (Addendum)
Left ear irrigated with H2O2 . Minmal return of wax, Pt tolerates well and denies pain at present.

## 2018-06-04 NOTE — ED Notes (Signed)
Pt CBG 92.  

## 2018-06-04 NOTE — Progress Notes (Signed)
ANTICOAGULATION CONSULT NOTE - Initial Consult  Pharmacy Consult for Coumadin Indication: h/o PE and mitral valve repair  No Known Allergies  Patient Measurements: Height: 5\' 9"  (175.3 cm) Weight: 190 lb 14.4 oz (86.6 kg) IBW/kg (Calculated) : 70.7  Vital Signs: Temp: 98.3 F (36.8 C) (07/28 2305) Temp Source: Oral (07/28 2305) BP: 125/94 (07/28 2305) Pulse Rate: 101 (07/28 2305)  Labs: Recent Labs    06/04/18 1519 06/04/18 1528  HGB 12.4*  --   HCT 36.6*  --   PLT 111*  --   LABPROT 36.6*  --   INR 3.73  --   CREATININE 0.89  --   CKTOTAL  --  262  TROPONINI <0.03  --     Estimated Creatinine Clearance: 98.7 mL/min (by C-G formula based on SCr of 0.89 mg/dL).   Medical History: Past Medical History:  Diagnosis Date  . Acute pulmonary embolism ([place]) 04/11/2016  . Anxiety   . Arthritis   . Atrial tachycardia ([place]) 01/06/2016  . Cancer ([place]) 07/2011   STAGE II NON- HODGKIN LYMPHOMA  . Depression   . Dyspnea   . ED (erectile dysfunction)   . Hypertension   . Lymphoma, non Hodgkin's 09/25/2011  . Mitral regurgitation 01/20/2016  . Mobitz I 10/09/2015  . Murmur 01/06/2016  . Occasional tremors   . PAC (premature atrial contraction) 10/09/2015  . S/P minimally-invasive mitral valve repair 10/27/2016   Complex valvuloplasty including artificial Gore-tex neochord placement x6 and 34 mm Edwards Physio II ring annuloplasty via right mini thoracotomy approach    Medications:  Facility-Administered Medications Prior to Admission  Medication Dose Route Frequency Provider Last Rate Last Dose  . diclofenac sodium (VOLTAREN) 1 % transdermal gel 2 g  2 g Topical QID PRN Aundra Dubin, PA-C       Medications Prior to Admission  Medication Sig Dispense Refill Last Dose  . diclofenac sodium (VOLTAREN) 1 % GEL Apply two grams. Use four times daily for pain (Patient taking differently: Apply 1 g topically 2 (two) times daily as needed (for pain). ) 1 Tube 0 Past Month at  Unknown time  . tetrahydrozoline (REDNESS RELIEVER EYE DROPS) 0.05 % ophthalmic solution Place 1 drop into both eyes daily as needed (red eyes).   unk at Unknown time  . warfarin (COUMADIN) 5 MG tablet Take 1 tablet by mouth daily or as directed by coumadin clinic (Patient taking differently: Take 5 mg by mouth every other [place]. clinic) 90 tablet 0 06/03/2018 at 2100  . meclizine (ANTIVERT) 25 MG tablet Take 1 tablet (25 mg total) by mouth 3 (three) times daily as needed for dizziness. (Patient not taking: Reported on 06/04/2018) 30 tablet 0 Not Taking at Unknown time  . triamcinolone cream (KENALOG) 0.1 % Apply 1 application topically 2 (two) times daily. (Patient not taking: Reported on 02/21/2018) 454 g 0 Not Taking at Unknown time   Scheduled:  . [START ON 3/61/4431] folic acid  1 mg Oral Daily  . LORazepam  0-4 mg Oral Q6H   Followed by  . [START ON 06/06/2018] LORazepam  0-4 mg Oral Q12H  . [START ON 06/05/2018] multivitamin with minerals  1 tablet Oral Daily  . [START ON 06/05/2018] thiamine  100 mg Oral Daily   Or  . [START ON 06/05/2018] thiamine  100 mg Intravenous Daily   Infusions:  . sodium chloride 75 mL/hr at 06/04/18 2356    Assessment: 57yo male c/o tremors, dizziness, and nausea, admitted for further w/u, to  continue Coumadin for h/o PE and mitral valve repair; current INR above goal w/ last dose of Coumadin taken 7/27.  Goal of Therapy:  INR2-3 per outpt [facility] clinic notes   Plan:  Will hold Coumadin for now and monitor INR for dose adjustments.  Wynona Neat, PharmD, BCPS  06/04/2018,11:57 PM
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
## 2018-06-04 NOTE — ED Notes (Signed)
Pt ambulated with unsteady gait. Pt was shaking while ambulating. Asked pt why he was shaking pt stated "im just nervous." Pt needed 1 assist.

## 2018-06-04 NOTE — ED Notes (Signed)
Assisted Pt to bathroom in wheelchair.

## 2018-06-04 NOTE — ED Provider Notes (Signed)
[place] EMERGENCY DEPARTMENT Provider Note   CSN: [id number] Arrival date & time: 06/04/18  1407     History   Chief Complaint Chief Complaint  Patient presents with  . Dizziness  . Nausea    HPI Leonard Bentley is a 57 y.o. male.  Pt presents to the ED today with dizziness and nausea.  The pt said sx started last night, but are worse today.  The pt has had periodic dizziness since he had chemo several years ago.  The pt said he drinks alcohol frequently, last dose last night.  The pt has a hx of PE and is on coumadin.  The pt has been compliant with his meds.  Pt denies cp or sob.     Past Medical History:  Diagnosis Date  . Acute pulmonary embolism ([place]) 04/11/2016  . Anxiety   . Arthritis   . Atrial tachycardia ([place]) 01/06/2016  . Cancer ([place]) 07/2011   STAGE II NON- HODGKIN LYMPHOMA  . Depression   . Dyspnea   . ED (erectile dysfunction)   . Hypertension   . Lymphoma, non Hodgkin's 09/25/2011  . Mitral regurgitation 01/20/2016  . Mobitz I 10/09/2015  . Murmur 01/06/2016  . Occasional tremors   . PAC (premature atrial contraction) 10/09/2015  . S/P minimally-invasive mitral valve repair 10/27/2016   Complex valvuloplasty including artificial Gore-tex neochord placement x6 and 34 mm Edwards Physio II ring annuloplasty via right mini thoracotomy approach    Patient Active Problem List   Diagnosis Date Noted  . Posterior tibial tendon dysfunction 02/21/2018  . Elevated INR 11/27/2016  . Dehydration 11/27/2016  . SVT (supraventricular tachycardia) ([place])   . S/P minimally-invasive mitral valve repair 10/27/2016  . History of pulmonary embolus (PE) 04/11/2016  . Left sided numbness   . Anxiety disorder 04/02/2016  . Panic attacks 04/02/2016  . Mitral valve prolapse   . Mitral regurgitation 01/20/2016  . Atrial tachycardia ([place]) 01/06/2016  . Murmur 01/06/2016  . Mobitz I 10/09/2015  . PAC (premature atrial contraction) 10/09/2015  . Arthritis  08/14/2015  . Neuropathy due to chemotherapeutic drug ([place]) 03/09/2013  . Leucopenia 01/31/2013  . Personal history of pulmonary embolism 12/28/2012  . Alcohol abuse 08/24/2012  . Cigarette smoker 08/24/2012  . Hemorrhoid 03/08/2012  . Anemia 12/10/2011  . History of non-Hodgkin's lymphoma 09/25/2011    Past Surgical History:  Procedure Laterality Date  . CARDIAC CATHETERIZATION N/A 08/13/2016   Procedure: Right/Left Heart Cath and Coronary Angiography;  Surgeon: [name], MD;  Location: [place] [facility];  Service: Cardiovascular;  Laterality: N/A;  . MITRAL VALVE REPAIR Right 10/27/2016   Procedure: MINIMALLY INVASIVE MITRAL VALVE REPAIR (MVR) USING 34 PHYSIO II ANNULOPLASTY RING;  Surgeon: [name], MD;  Location: [place];  Service: Open Heart Surgery;  Laterality: Right;  . NO PAST SURGERIES    . SKIN BIOPSY Right 04/04/2018   right mid medial anterior tibial shave  see report in chart  . TEE WITHOUT CARDIOVERSION N/A 02/11/2016   Procedure: TRANSESOPHAGEAL ECHOCARDIOGRAM (TEE);  Surgeon: [name], MD;  Location: [place];  Service: Cardiovascular;  Laterality: N/A;  . TEE WITHOUT CARDIOVERSION N/A 10/27/2016   Procedure: TRANSESOPHAGEAL ECHOCARDIOGRAM (TEE);  Surgeon: [name], MD;  Location: [place];  Service: Open Heart Surgery;  Laterality: N/A;  . TRIGGER FINGER RELEASE Bilateral         Home Medications    Prior to Admission medications   Medication Sig Start Date  End Date Taking? Authorizing Provider  diclofenac sodium (VOLTAREN) 1 % GEL Apply two grams. Use four times daily for pain Patient taking differently: Apply 1 g topically 2 (two) times daily as needed (for pain).  02/21/18  Yes Aundra Dubin, PA-C  tetrahydrozoline (REDNESS RELIEVER EYE DROPS) 0.05 % ophthalmic solution Place 1 drop into both eyes daily as needed (red eyes).   Yes [provider]  warfarin (COUMADIN) 5 MG tablet Take 1 tablet by mouth daily or as  directed by coumadin clinic Patient taking differently: Take 5 mg by mouth every other [place]. clinic 04/14/18  Yes [name], MD  meclizine (ANTIVERT) 25 MG tablet Take 1 tablet (25 mg total) by mouth 3 (three) times daily as needed for dizziness. Patient not taking: Reported on 06/04/2018 04/24/18   McDonald, Mia A, PA-C  triamcinolone cream (KENALOG) 0.1 % Apply 1 application topically 2 (two) times daily. Patient not taking: Reported on 02/21/2018 02/13/18   Tysinger, Camelia Eng, PA-C    Family History Family History  Problem Relation Age of Onset  . Cancer Mother        BREAST(BONE)  . Cancer Father        PANCREATIC  . Hypertension Maternal Grandmother   . Stroke Maternal Aunt   . Heart attack Neg Hx     Social History Social History   Tobacco Use  . Smoking status: Former Smoker    Packs/Saadeh: 1.00    Years: 32.00    Pack years: 32.00    Types: Cigarettes, E-cigarettes    Last attempt to quit: 07/06/2013    Years since quitting: 4.9  . Smokeless tobacco: Current User  . Tobacco comment: smokes vapor cig. 3 times [place]  Substance Use Topics  . Alcohol use: Yes    Alcohol/week: 0.0 oz    Comment:  1/2 bottle of wine a [place]  . Drug use: No     Allergies   Patient has no known allergies.   Review of Systems Review of Systems  Gastrointestinal: Positive for nausea.  Neurological: Positive for tremors.  All other systems reviewed and are negative.    Physical Exam Updated Vital Signs BP 128/90   Pulse (!) 106   Temp 98.3 F (36.8 C) (Oral)   Resp 20   Ht 5\' 9"  (1.753 m)   Wt 86.2 kg (190 lb)   SpO2 99%   BMI 28.06 kg/m   Physical Exam  Constitutional: He is oriented to person, place, and time. He appears well-developed and well-nourished.  HENT:  Head: Normocephalic and atraumatic.  Right Ear: External ear normal.  Left Ear: External ear normal.  Nose: Nose normal.  Mouth/Throat: Mucous membranes are dry.  Eyes: Pupils are equal, round, and reactive to  light. Conjunctivae and EOM are normal.  Neck: Normal range of motion. Neck supple.  Cardiovascular: Normal rate, regular rhythm, normal heart sounds and intact distal pulses.  Pulmonary/Chest: Effort normal and breath sounds normal.  Abdominal: Soft. Bowel sounds are normal.  Musculoskeletal: Normal range of motion.  Neurological: He is alert and oriented to person, place, and time. He displays tremor.  Skin: Skin is warm. Capillary refill takes less than 2 seconds.  Psychiatric: His behavior is normal. Judgment and thought content normal. His mood appears anxious.  Nursing note and vitals reviewed.    ED Treatments / Results  Labs (all labs ordered are listed, but only abnormal results are displayed) Labs Reviewed  BASIC METABOLIC PANEL - Abnormal; Notable for the  following components:      Result Value   Calcium 8.6 (*)    All other components within normal limits  CBC - Abnormal; Notable for the following components:   WBC 3.3 (*)    Hemoglobin 12.4 (*)    HCT 36.6 (*)    MCV 76.9 (*)    RDW 17.8 (*)    Platelets 111 (*)    All other components within normal limits  PROTIME-INR - Abnormal; Notable for the following components:   Prothrombin Time 36.6 (*)    All other components within normal limits  ETHANOL - Abnormal; Notable for the following components:   Alcohol, Ethyl (B) 15 (*)    All other components within normal limits  TROPONIN I  URINALYSIS, ROUTINE W REFLEX MICROSCOPIC  CBG MONITORING, ED  I-STAT TROPONIN, ED    EKG EKG Interpretation  Date/Time:  Sunday June 04 2018 14:08:39 EDT Ventricular Rate:  94 PR Interval:    QRS Duration: 84 QT Interval:  391 QTC Calculation: 489 R Axis:   -48 Text Interpretation:  Sinus rhythm Atrial premature complex LAD, consider left anterior fascicular block Borderline prolonged QT interval No significant change since last tracing Confirmed by Isla Pence [phone]) on 06/04/2018 2:23:44 PM Also confirmed by Isla Pence [phone]), editor Philomena Doheny [phone])  on 06/04/2018 3:45:11 PM   Radiology No results found.  Procedures Procedures (including critical care time)  Medications Ordered in ED Medications  sodium chloride 0.9 % bolus 1,000 mL (1,000 mLs Intravenous New Bag/Given 06/04/18 1619)  LORazepam (ATIVAN) injection 1 mg (has no administration in time range)  sodium chloride 0.9 % bolus 1,000 mL (0 mLs Intravenous Stopped 06/04/18 1609)  LORazepam (ATIVAN) injection 1 mg (1 mg Intravenous Given 06/04/18 1501)  ondansetron (ZOFRAN) injection 4 mg (4 mg Intravenous Given 06/04/18 1500)     Initial Impression / Assessment and Plan / ED Course  I have reviewed the triage vital signs and the nursing notes.  Pertinent labs & imaging results that were available during my care of the patient were reviewed by me and considered in my medical decision making (see chart for details).   Pt is feeling slightly better.  Waiting on labs/ua.  Pt signed out to Dr. Lita Mains.   Final Clinical Impressions(s) / ED Diagnoses   Final diagnoses:  Dizziness  Impacted cerumen of left ear    ED Discharge Orders    None       Isla Pence, MD 06/05/18 [phone]

## 2018-06-04 NOTE — ED Triage Notes (Addendum)
Pt arrives EMS from [facility]me with c/o tremors, dizziness and nauseA. A&O x 3 maew. NAD

## 2018-06-05 ENCOUNTER — Inpatient Hospital Stay (HOSPITAL_COMMUNITY): Payer: Self-pay

## 2018-06-05 ENCOUNTER — Encounter (HOSPITAL_COMMUNITY): Payer: Self-pay | Admitting: Nephrology

## 2018-06-05 DIAGNOSIS — F419 Anxiety disorder, unspecified: Secondary | ICD-10-CM

## 2018-06-05 DIAGNOSIS — F10230 Alcohol dependence with withdrawal, uncomplicated: Secondary | ICD-10-CM

## 2018-06-05 DIAGNOSIS — R945 Abnormal results of liver function studies: Secondary | ICD-10-CM

## 2018-06-05 DIAGNOSIS — R7989 Other specified abnormal findings of blood chemistry: Secondary | ICD-10-CM

## 2018-06-05 DIAGNOSIS — K7010 Alcoholic hepatitis without ascites: Secondary | ICD-10-CM

## 2018-06-05 LAB — HEPATIC FUNCTION PANEL
ALT: 113 U/L — ABNORMAL HIGH (ref 0–44)
AST: 192 U/L — ABNORMAL HIGH (ref 15–41)
Albumin: 3.8 g/dL (ref 3.5–5.0)
Alkaline Phosphatase: 68 U/L (ref 38–126)
Bilirubin, Direct: 0.4 mg/dL — ABNORMAL HIGH (ref 0.0–0.2)
Indirect Bilirubin: 1.5 mg/dL — ABNORMAL HIGH (ref 0.3–0.9)
Total Bilirubin: 1.9 mg/dL — ABNORMAL HIGH (ref 0.3–1.2)
Total Protein: 6.8 g/dL (ref 6.5–8.1)

## 2018-06-05 LAB — BASIC METABOLIC PANEL
Anion gap: 14 (ref 5–15)
BUN: 8 mg/dL (ref 6–20)
CO2: 26 mmol/L (ref 22–32)
Calcium: 8.7 mg/dL — ABNORMAL LOW (ref 8.9–10.3)
Chloride: 98 mmol/L (ref 98–111)
Creatinine, Ser: 1.04 mg/dL (ref 0.61–1.24)
GFR calc Af Amer: >60 mL/min (ref >60)
GFR calc non Af Amer: >60 mL/min (ref >60)
Glucose, Bld: 88 mg/dL (ref 70–99)
Potassium: 3.6 mmol/L (ref 3.5–5.1)
Sodium: 138 mmol/L (ref 135–145)

## 2018-06-05 LAB — CBC
HCT: 37.7 % — ABNORMAL LOW (ref 39.0–52.0)
Hemoglobin: 12.6 g/dL — ABNORMAL LOW (ref 13.0–17.0)
MCH: 25.9 pg — ABNORMAL LOW (ref 26.0–34.0)
MCHC: 33.4 g/dL (ref 30.0–36.0)
MCV: 77.4 fL — ABNORMAL LOW (ref 78.0–100.0)
Platelets: 104 10*3/uL — ABNORMAL LOW (ref 150–400)
RBC: 4.87 MIL/uL (ref 4.22–5.81)
RDW: 18.4 % — ABNORMAL HIGH (ref 11.5–15.5)
WBC: 2.6 10*3/uL — ABNORMAL LOW (ref 4.0–10.5)

## 2018-06-05 LAB — PROTIME-INR
INR: 2.62
Prothrombin Time: 27.8 seconds — ABNORMAL HIGH (ref 11.4–15.2)

## 2018-06-05 LAB — TROPONIN I
Troponin I: <0.03 ng/mL (ref <0.03)
Troponin I: 0.03 ng/mL (ref <0.03)
Troponin I: <0.03 ng/mL (ref <0.03)

## 2018-06-05 LAB — HIV ANTIBODY (ROUTINE TESTING W REFLEX): HIV Screen 4th Generation wRfx: NONREACTIVE

## 2018-06-05 LAB — MAGNESIUM: Magnesium: 1.5 mg/dL — ABNORMAL LOW (ref 1.7–2.4)

## 2018-06-05 LAB — ACETAMINOPHEN LEVEL: Acetaminophen (Tylenol), Serum: <10 ug/mL — ABNORMAL LOW (ref 10–30)

## 2018-06-05 LAB — TSH: TSH: 1.553 u[IU]/mL (ref 0.350–4.500)

## 2018-06-05 MED ORDER — LORAZEPAM 2 MG/ML IJ SOLN
2.0000 mg | Freq: Once | INTRAMUSCULAR | Status: AC
Start: 2018-06-05 — End: 2018-06-05
  Administered 2018-06-05: 2 mg via INTRAVENOUS
  Filled 2018-06-05: qty 1

## 2018-06-05 MED ORDER — ESCITALOPRAM OXALATE 10 MG PO TABS
10.0000 mg | ORAL_TABLET | Freq: Every day | ORAL | Status: DC
  Administered 2018-06-05 – 2018-06-12 (×7): 10 mg via ORAL
  Filled 2018-06-05 (×7): qty 1

## 2018-06-05 MED ORDER — IOPAMIDOL (ISOVUE-370) INJECTION 76%
INTRAVENOUS | Status: AC
  Administered 2018-06-05: 100 mL
  Filled 2018-06-05: qty 100

## 2018-06-05 MED ORDER — WARFARIN - PHARMACIST DOSING INPATIENT
Freq: Every day | Status: DC
  Administered 2018-06-05 – 2018-06-07 (×2)

## 2018-06-05 MED ORDER — LOPERAMIDE HCL 2 MG PO CAPS
4.0000 mg | ORAL_CAPSULE | Freq: Once | ORAL | Status: AC
  Administered 2018-06-05: 4 mg via ORAL
  Filled 2018-06-05: qty 2

## 2018-06-05 MED ORDER — ALUM & MAG HYDROXIDE-SIMETH 200-200-20 MG/5ML PO SUSP
15.0000 mL | Freq: Once | ORAL | Status: AC
  Administered 2018-06-05: 15 mL via ORAL
  Filled 2018-06-05: qty 30

## 2018-06-05 MED ORDER — WARFARIN SODIUM 5 MG PO TABS
5.0000 mg | ORAL_TABLET | Freq: Once | ORAL | Status: AC
  Administered 2018-06-05: 5 mg via ORAL
  Filled 2018-06-05: qty 1

## 2018-06-05 NOTE — Progress Notes (Signed)
Patient back from CT.

## 2018-06-05 NOTE — Progress Notes (Signed)
Patient's HR is 150bpm,  I checked on pt he is in the bathroom but he answered he was alright, told him to call if he needed help.MD on call paged,will continue to monitor.

## 2018-06-05 NOTE — Consult Note (Signed)
Cardiology Consult    Patient ID: Leonard Bentley MRN: [id number], DOB/AGE: October 08, 1961   Admit date: 06/04/2018 Date of Consult: 06/05/2018  Primary Physician: Leonard Lung, MD Primary Cardiologist: Dr. Oval Bentley Requesting Provider: Dr. Hal Bentley Reason for Consultation: Shortness of breath, MVR  Leonard Bentley is a 57 y.o. male who is being seen today for the evaluation of shortness of breath/MVR at the request of Dr. Hal Bentley.   Patient Profile    57 yo male with PMH of severe mitral regurg s/p mitral valve repair, Chronic systolic HF, ETOH/Tobacco abuse, PE and non-Hodgkin's lymphoma who presented with weakness, pain in his legs and shortness of breath.   Past Medical History   Past Medical History:  Diagnosis Date  . Acute pulmonary embolism ([place]) 04/11/2016  . Anxiety   . Arthritis   . Atrial tachycardia ([place]) 01/06/2016  . Cancer ([place]) 07/2011   STAGE II NON- HODGKIN LYMPHOMA  . Depression   . Dyspnea   . ED (erectile dysfunction)   . Hypertension   . Lymphoma, non Hodgkin's 09/25/2011  . Mitral regurgitation 01/20/2016  . Mobitz I 10/09/2015  . Murmur 01/06/2016  . Occasional tremors   . PAC (premature atrial contraction) 10/09/2015  . S/P minimally-invasive mitral valve repair 10/27/2016   Complex valvuloplasty including artificial Gore-tex neochord placement x6 and 34 mm Edwards Physio II ring annuloplasty via right mini thoracotomy approach    Past Surgical History:  Procedure Laterality Date  . CARDIAC CATHETERIZATION N/A 08/13/2016   Procedure: Right/Left Heart Cath and Coronary Angiography;  Surgeon: Leonard Booze, MD;  Location: [facility];  Service: Cardiovascular;  Laterality: N/A;  . MITRAL VALVE REPAIR Right 10/27/2016   Procedure: MINIMALLY INVASIVE MITRAL VALVE REPAIR (MVR) USING 34 PHYSIO II ANNULOPLASTY RING;  Surgeon: Leonard Alberts, MD;  Location: [facility];  Service: Open Heart Surgery;  Laterality: Right;  . NO PAST SURGERIES    . SKIN  BIOPSY Right 04/04/2018   right mid medial anterior tibial shave  see report in chart  . TEE WITHOUT CARDIOVERSION N/A 02/11/2016   Procedure: TRANSESOPHAGEAL ECHOCARDIOGRAM (TEE);  Surgeon: Leonard Latch, MD;  Location: [facility];  Service: Cardiovascular;  Laterality: N/A;  . TEE WITHOUT CARDIOVERSION N/A 10/27/2016   Procedure: TRANSESOPHAGEAL ECHOCARDIOGRAM (TEE);  Surgeon: Leonard Alberts, MD;  Location: [facility];  Service: Open Heart Surgery;  Laterality: N/A;  . TRIGGER FINGER RELEASE Bilateral      Allergies  No Known Allergies  History of Present Illness    [name] is a 57 yo male with PMH of severe mitral regurg s/p mitral valve repair, Chronic systolic HF, ETOH/Tobacco abuse, PE and non-Hodgkin's lymphoma. He was initally referred by his PCP after finding he had Mobitz 1 on EKG. Underwent exercise stress test back in 2016 that showed a normal EF and no ischemia. Back in 3/17 he had an echo showing normal EF with a posterior mitral valve leaflet prolapse. Referred to Dr. Roxy Bentley and underwent a pre op CT that showed a PE in the right lower lobe. Hx of PE back several years prior after being on testosterone. He was placed on Xarelto. Had a follow up CT showing resolution of PE in 9/17 and underwent minimally invasive mitral valve repair back in 12/17.   Saw seen in the ED on 1/18 with SVT, along with ST and atrial tachycardia. His metoprolol was increased to 50mg  daily. Was last seen in the office on 6/18 by Dr. Oval Bentley and reported  overall doing well at that time. Did note he struggled with anxiety but had not discussed this with his PCP. Last echo on 02/22/17 showed EF of 50-55% with no WMA, and no mitral regurg noted. He reports being transitioned from Xarelto to coumadin 2/2 to cost.   In talking with the patient he was fired from his job of 29 years about a month ago and has not been coping well with this. He lost his insurance and has not been able to follow up with his MDs as a  result. Appears that [facility] has been following his INR with last visit was from back in 04/14/18. Presented to the ED with dizziness, weakness and nausea. History of his symptoms are somewhat vague and scattered. In review of ED notes, he was tachycardiac and had sensory hallucinations. He was started on the CIWA protocol. He denies any chest pain. Labs showed stable electrolytes, AST 192/ALT 113, Hgb 12.6, Trop 0.03 x3. EKG showed SR with 1st degree AVB, and prolonged QTC unchanged from previous tracings. There was concern over him possibly missing coumadin doses and he underwent a CT chest which was negative for PE. INR was actually 3.73 on admission.   Inpatient Medications    . folic acid  1 mg Oral Daily  . LORazepam  0-4 mg Oral Q6H   Followed by  . [START ON 06/06/2018] LORazepam  0-4 mg Oral Q12H  . multivitamin with minerals  1 tablet Oral Daily  . thiamine  100 mg Oral Daily   Or  . thiamine  100 mg Intravenous Daily  . warfarin  5 mg Oral ONCE-1800  . Warfarin - Pharmacist Dosing Inpatient   Does not apply q1800    Family History    Family History  Problem Relation Age of Onset  . Cancer Mother        BREAST(BONE)  . Cancer Father        PANCREATIC  . Hypertension Maternal Grandmother   . Stroke Maternal Aunt   . Heart attack Neg Hx     Social History    Social History   Socioeconomic History  . Marital status: Single    Spouse name: Not on file  . Number of children: Not on file  . Years of education: Not on file  . Highest education level: Not on file  Occupational History  . Not on file  Social Needs  . Financial resource strain: Not on file  . Food insecurity:    Worry: Not on file    Inability: Not on file  . Transportation needs:    Medical: Not on file    Non-medical: Not on file  Tobacco Use  . Smoking status: Former Smoker    Packs/Cromley: 1.00    Years: 32.00    Pack years: 32.00    Types: Cigarettes, E-cigarettes    Last attempt to quit:  07/06/2013    Years since quitting: 4.9  . Smokeless tobacco: Current User  . Tobacco comment: smokes vapor cig. 3 times [place]  Substance and Sexual Activity  . Alcohol use: Yes    Alcohol/week: 0.0 oz    Comment:  1/2 bottle of wine a [place]  . Drug use: No  . Sexual activity: Never  Lifestyle  . Physical activity:    Days per week: Not on file    Minutes per session: Not on file  . Stress: Not on file  Relationships  . Social connections:    Talks on phone: Not  on file    Gets together: Not on file    Attends religious service: Not on file    Active member of club or organization: Not on file    Attends meetings of clubs or organizations: Not on file    Relationship status: Not on file  . Intimate partner violence:    Fear of current or ex partner: Not on file    Emotionally abused: Not on file    Physically abused: Not on file    Forced sexual activity: Not on file  Other Topics Concern  . Not on file  Social History Narrative   Epworth sleepiness scale as of 10/09/15 a 1     Review of Systems    See HPI  All other systems reviewed and are otherwise negative except as noted above.  Physical Exam    Blood pressure 101/78, pulse 97, temperature 98.4 F (36.9 C), temperature source Oral, resp. rate 18, height 5\' 9"  (1.753 m), weight 187 lb 12.8 oz (85.2 kg), SpO2 100 %.  General: Pleasant, AAM, NAD Psych: Normal affect. Neuro: Alert and oriented X 3. Moves all extremities spontaneously. HEENT: Normal  Neck: Supple, no JVD. Lungs:  Resp regular and unlabored, CTA. Heart: RRR no murmurs. Abdomen: Soft, non-tender, non-distended, BS + x 4.  Extremities: No clubbing, cyanosis or edema. DP/PT/Radials 2+ and equal bilaterally.  Labs    Troponin [facility] Test) Recent Labs    06/04/18 1528  TROPIPOC 0.01   Recent Labs    06/04/18 1519 06/04/18 1528 06/05/18 0203 06/05/18 0618  CKTOTAL  --  262  --   --   TROPONINI <0.03  --  <0.03 0.03*   Lab Results    Component Value Date   WBC 2.6 (L) 06/05/2018   HGB 12.6 (L) 06/05/2018   HCT 37.7 (L) 06/05/2018   MCV 77.4 (L) 06/05/2018   PLT 104 (L) 06/05/2018    Recent Labs  Lab 06/05/18 0618  NA 138  K 3.6  CL 98  CO2 26  BUN 8  CREATININE 1.04  CALCIUM 8.7*  PROT 6.8  BILITOT 1.9*  ALKPHOS 68  ALT 113*  AST 192*  GLUCOSE 88   Lab Results  Component Value Date   CHOL 239 (H) 04/03/2016   HDL 126 04/03/2016   LDLCALC 100 (H) 04/03/2016   TRIG 63 04/03/2016   No results found for: [facility]   Radiology Studies    Ct Angio Chest Pe W Or Wo Contrast  Result Date: 06/05/2018 CLINICAL DATA:  Shortness of breath. History of pulmonary embolism, mitral valve repair, non-Hodgkin's lymphoma. EXAM: CT ANGIOGRAPHY CHEST WITH CONTRAST TECHNIQUE: Multidetector CT imaging of the chest was performed using the standard protocol during bolus administration of intravenous contrast. Multiplanar CT image reconstructions and MIPs were obtained to evaluate the vascular anatomy. CONTRAST:  <See Chart> ISOVUE-370 IOPAMIDOL (ISOVUE-370) INJECTION 76% COMPARISON:  Chest CT September 21, 2017 FINDINGS: CARDIOVASCULAR: Adequate contrast opacification of the pulmonary artery's. Main pulmonary artery is not enlarged. No pulmonary arterial filling defects to the level of the subsegmental branches. Moderate cardiomegaly. No pericardial effusion. Mitral valve replacement. No pericardial effusion. Thoracic aorta is normal course and caliber, unremarkable. MEDIASTINUM/NODES: Similar mediastinal and hilar lymphadenopathy. LUNGS/PLEURA: Tracheobronchial tree is patent, no pneumothorax. Innumerable subcentimeter pulmonary nodules similar to prior CT. No pleural effusion or focal consolidation. RIGHT middle lobe atelectasis/scarring. UPPER ABDOMEN: Nonacute.  Hepatic steatosis and calcified granuloma. MUSCULOSKELETAL: Nonacute.  Moderate thoracic spondylosis. Review of the MIP images confirms  the above findings. IMPRESSION: 1. No  acute pulmonary embolism. 2. Stable mediastinal and hilar lymphadenopathy. 3. Similar innumerable small pulmonary nodules. Electronically Signed   By: Elon Alas M.D.   On: 06/05/2018 04:19    ECG & Cardiac Imaging    EKG:  The EKG was personally reviewed and demonstrates SR with 1st degree AVB, and prolonged QTc  Echo: 02/22/17  Study Conclusions  - Left ventricle: The cavity size was normal. There was mild   concentric hypertrophy. Systolic function was normal. The   estimated ejection fraction was in the range of 50% to 55%. Wall   motion was normal; there were no regional wall motion   abnormalities. Doppler parameters are consistent with abnormal   left ventricular relaxation (grade 1 diastolic dysfunction).   Doppler parameters are consistent with indeterminate ventricular   filling pressure. - Aortic valve: Transvalvular velocity was within the normal range.   There was no stenosis. There was no regurgitation. - Mitral valve: Prior procedures included surgical repair.   Transvalvular velocity was within the normal range. There was no   evidence for stenosis. There was no regurgitation. - Left atrium: The atrium was moderately dilated. - Right ventricle: Systolic function was normal. - Atrial septum: No defect or patent foramen ovale was identified   by color flow Doppler. - Tricuspid valve: There was mild regurgitation. - Pulmonary arteries: Systolic pressure was within the normal   range. PA peak pressure: 15 mm Hg (S).  Impressions:  - Since echo 01/8328, systolic function has improved and there is no   evidence of ascending aorta aneurysm.   Assessment & Plan    57 yo male with PMH of severe mitral regurg s/p mitral valve repair, Chronic systolic HF, ETOH/Tobacco abuse, PE and non-Hodgkin's lymphoma who presented with weakness, pain in his legs and shortness of breath.   1. Dizziness/weakness/tremors: his symptoms are somewhat vague. Difficult to discern  what actually brought him to the ED. Notes mention concern for ETOH withdrawal. He does report recently losing his job about a month ago and has not been doing well since that time. He is currently on the CIWA protocol.   2. S/p mitral valve repair (12/17): Last echo was back in 4/18 and noted EF of 50-55% with no WMA and no mitral regurg. He has no signs of volume overload on exam. Chest CT with no edema. Does report some dyspnea but he continues to use a vape as he has for the past 5 years.   3. Chronic systolic HF: Last echo in 4/18 with essential normal EF. No signs of volume overload on exam, or chest CT.   4. Hx of PE: Had been on Xarelto in the past, but unable to afford. Was switched to Coumadin, but INR has not been followed since 04/14/18. There was concern over missed doses, but INR was 3.78 on admission. Lost his job about a month ago. Will need CM to assistance as he has lost his insurance.  -- coumadin per PharmD  5. ETOH/tobacco use: cessation advised.   6. Elevated LFTs: Denies any abd pain. Further work up per primary.   Barnet Pall, NP-C Pager [phone] 06/05/2018, 12:47 PM
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
## 2018-06-05 NOTE — Care Management Note (Signed)
Case Management Note  Patient Details  Name: Leonard Bentley MRN: [id number] Date of Birth: May 01, 1961  Subjective/Objective:  Alcohol Withdrawl                Action/Plan: CM following for progression of care; on CIWA protocol for withdrawal. PCP: [name], MD; noted no medical insurance; Financial Counselor to see the patient to determine what he might qualify for.  Expected Discharge Date:  Possibly 8/2/109              Expected Discharge Plan:  Home/Self Care  Discharge planning Services  CM Consult  Status of Service:  In process, will continue to follow  Sherrilyn Rist [phone] 06/05/2018, 11:05 AM

## 2018-06-05 NOTE — Progress Notes (Signed)
ANTICOAGULATION CONSULT NOTE   Pharmacy Consult for Coumadin Indication: h/o PE and mitral valve repair  No Known Allergies  Patient Measurements: Height: 5\' 9"  (175.3 cm) Weight: 187 lb 12.8 oz (85.2 kg) IBW/kg (Calculated) : 70.7  Vital Signs: Temp: 97.7 F (36.5 C) (07/29 0826) Temp Source: Oral (07/29 0826) BP: 116/88 (07/29 0826) Pulse Rate: 74 (07/29 0826)  Labs: Recent Labs    06/04/18 1519 06/04/18 1528 06/05/18 0203 06/05/18 0618  HGB 12.4*  --   --  12.6*  HCT 36.6*  --   --  37.7*  PLT 111*  --   --  104*  LABPROT 36.6*  --   --  27.8*  INR 3.73  --   --  2.62  CREATININE 0.89  --   --  1.04  CKTOTAL  --  262  --   --   TROPONINI <0.03  --  <0.03 0.03*   Assessment: 57yo male c/o tremors, dizziness, and nausea, admitted for further w/u, to continue Coumadin for h/o PE and mitral valve repair.  Admitting INR above goal (3.77) but dow to 2.62 today.  Dose was held last evening.  His CBC is stable but platelets have dropped (182K a month ago and 104K today).  Goal of Therapy:  INR2-3 per outpt [facility] clinic notes   Plan:  Will give Warfarin 5mg  x 1  F/U AM labs and adjust as needed.  Rober Minion, PharmD., MS Clinical Pharmacist Pager:  [phone] Thank you for allowing pharmacy to be part of this patients care team. 06/05/2018,10:04 AM

## 2018-06-05 NOTE — Progress Notes (Signed)
Patient having loose stool, says he has loose stools quite often and asked for something to help relieve this. NP paged.

## 2018-06-05 NOTE — Plan of Care (Signed)
  Problem: Education: Goal: Knowledge of General Education information will improve Description Including pain rating scale, medication(s)/side effects and non-pharmacologic comfort measures Outcome: Progressing   Problem: Health Behavior/Discharge Planning: Goal: Ability to manage health-related needs will improve Outcome: Progressing   

## 2018-06-05 NOTE — Evaluation (Signed)
Physical Therapy Evaluation Patient Details Name: Leonard Bentley MRN: [id number] DOB: 1961/03/10 Today's Date: 06/05/2018   History of Present Illness  57 yo admitted with ETOH withdrawal and SOB. PMHx: MVR, CHF, PE, tremors, non Hodgkin lymphoma  Clinical Impression  Pt very pleasant able to move and walk without assist but states he has had 1 fall in the last year and enjoys running short distances. Pt reports he has been feeling unsteady for some time at least a year and maybe even as long as 6 years. Pt does not demonstrate vestibular dysfunction but rather balance disturbance with underlying neuropathy with decreased function, balance and independence who will benefit from acute therapy to maximize independence prior to D/C.      Follow Up Recommendations Outpatient PT    Equipment Recommendations       Recommendations for Other Services       Precautions / Restrictions Precautions Precautions: Fall Restrictions Weight Bearing Restrictions: No      Mobility  Bed Mobility Overal bed mobility: Modified Independent                Transfers Overall transfer level: Modified independent                  Ambulation/Gait Ambulation/Gait assistance: Supervision Gait Distance (Feet): 250 Feet Assistive device: None Gait Pattern/deviations: Step-through pattern   Gait velocity interpretation: >2.62 ft/sec, indicative of community ambulatory General Gait Details: pt with good stride and functional speed with slight imbalance x 1 with pt able to recover without assist  Stairs            Wheelchair Mobility    Modified Rankin (Stroke Patients Only)       Balance Overall balance assessment: Needs assistance   Sitting balance-Leahy Scale: Good       Standing balance-Leahy Scale: Good   Single Leg Stance - Right Leg: 4 Single Leg Stance - Left Leg: 2 Tandem Stance - Right Leg: 2 Tandem Stance - Left Leg: 0 Rhomberg - Eyes Opened: 60 Rhomberg -  Eyes Closed: 15                 Pertinent Vitals/Pain Pain Assessment: No/denies pain    Home Living Family/patient expects to be discharged to:: Private residence Living Arrangements: Alone Available Help at Discharge: Friend(s);Available PRN/intermittently Type of Home: House Home Access: Stairs to enter Entrance Stairs-Rails: Left Entrance Stairs-Number of Steps: 3 Home Layout: One level Home Equipment: None      Prior Function Level of Independence: Independent         Comments: pt just laid off from [employer] limited     Hand Dominance        Extremity/Trunk Assessment   Upper Extremity Assessment Upper Extremity Assessment: Overall WFL for tasks assessed    Lower Extremity Assessment Lower Extremity Assessment: Overall WFL for tasks assessed    Cervical / Trunk Assessment Cervical / Trunk Assessment: Normal  Communication   Communication: No difficulties  Cognition Arousal/Alertness: Awake/alert Behavior During Therapy: WFL for tasks assessed/performed Overall Cognitive Status: Within Functional Limits for tasks assessed                                        General Comments      Exercises     Assessment/Plan    PT Assessment Patient needs continued PT services  PT Problem List Decreased mobility;Decreased activity  tolerance;Decreased balance;Decreased knowledge of use of DME       PT Treatment Interventions Gait training;Therapeutic exercise;Patient/family education;Stair training;Balance training;Functional mobility training;Neuromuscular re-education;DME instruction;Therapeutic activities    PT Goals (Current goals can be found in the Care Plan section)  Acute Rehab PT Goals Patient Stated Goal: return home PT Goal Formulation: With patient Time For Goal Achievement: 06/19/18 Potential to Achieve Goals: Good    Frequency Min 3X/week   Barriers to discharge Decreased caregiver support      Co-evaluation                AM-PAC PT "6 Clicks" Daily Activity  Outcome Measure Difficulty turning over in bed (including adjusting bedclothes, sheets and blankets)?: None Difficulty moving from lying on back to sitting on the side of the bed? : None Difficulty sitting down on and standing up from a chair with arms (e.g., wheelchair, bedside commode, etc,.)?: A Little Help needed moving to and from a bed to chair (including a wheelchair)?: A Little Help needed walking in hospital room?: A Little Help needed climbing 3-5 steps with a railing? : A Little 6 Click Score: 20    End of Session Equipment Utilized During Treatment: Gait belt Activity Tolerance: Patient tolerated treatment well Patient left: in chair;with call bell/phone within reach Nurse Communication: Mobility status PT Visit Diagnosis: Other abnormalities of gait and mobility (R26.89);Unsteadiness on feet (R26.81)    Time: 0942-1000 PT Time Calculation (min) (ACUTE ONLY): 18 min   Charges:   PT Evaluation $PT Eval Moderate Complexity: 1 [address], [place]   Atiyah Bauer B Rosmery Duggin 06/05/2018, 11:01 AM

## 2018-06-05 NOTE — Progress Notes (Signed)
Troponin 0.03, no chest pain, MD paged.

## 2018-06-06 DIAGNOSIS — Z9889 Other specified postprocedural states: Secondary | ICD-10-CM

## 2018-06-06 DIAGNOSIS — F1010 Alcohol abuse, uncomplicated: Secondary | ICD-10-CM

## 2018-06-06 DIAGNOSIS — F10231 Alcohol dependence with withdrawal delirium: Secondary | ICD-10-CM

## 2018-06-06 DIAGNOSIS — Z8572 Personal history of non-Hodgkin lymphomas: Secondary | ICD-10-CM

## 2018-06-06 DIAGNOSIS — F10931 Alcohol use, unspecified with withdrawal delirium: Secondary | ICD-10-CM

## 2018-06-06 DIAGNOSIS — R9431 Abnormal electrocardiogram [ECG] [EKG]: Secondary | ICD-10-CM

## 2018-06-06 DIAGNOSIS — F10239 Alcohol dependence with withdrawal, unspecified: Secondary | ICD-10-CM

## 2018-06-06 DIAGNOSIS — Z86711 Personal history of pulmonary embolism: Secondary | ICD-10-CM

## 2018-06-06 DIAGNOSIS — R319 Hematuria, unspecified: Secondary | ICD-10-CM

## 2018-06-06 DIAGNOSIS — R42 Dizziness and giddiness: Secondary | ICD-10-CM

## 2018-06-06 LAB — PROTIME-INR
INR: 2.26
Prothrombin Time: 24.8 seconds — ABNORMAL HIGH (ref 11.4–15.2)

## 2018-06-06 LAB — GLUCOSE, CAPILLARY: Glucose-Capillary: 93 mg/dL (ref 70–99)

## 2018-06-06 LAB — BASIC METABOLIC PANEL
Anion gap: 10 (ref 5–15)
BUN: 10 mg/dL (ref 6–20)
CO2: 24 mmol/L (ref 22–32)
Calcium: 8.6 mg/dL — ABNORMAL LOW (ref 8.9–10.3)
Chloride: 103 mmol/L (ref 98–111)
Creatinine, Ser: 0.99 mg/dL (ref 0.61–1.24)
GFR calc Af Amer: >60 mL/min (ref >60)
GFR calc non Af Amer: >60 mL/min (ref >60)
Glucose, Bld: 129 mg/dL — ABNORMAL HIGH (ref 70–99)
Potassium: 4.1 mmol/L (ref 3.5–5.1)
Sodium: 137 mmol/L (ref 135–145)

## 2018-06-06 LAB — CBC
HCT: 34.7 % — ABNORMAL LOW (ref 39.0–52.0)
Hemoglobin: 11.7 g/dL — ABNORMAL LOW (ref 13.0–17.0)
MCH: 26.1 pg (ref 26.0–34.0)
MCHC: 33.7 g/dL (ref 30.0–36.0)
MCV: 77.3 fL — ABNORMAL LOW (ref 78.0–100.0)
Platelets: 89 10*3/uL — ABNORMAL LOW (ref 150–400)
RBC: 4.49 MIL/uL (ref 4.22–5.81)
RDW: 17.5 % — ABNORMAL HIGH (ref 11.5–15.5)
WBC: 4.1 10*3/uL (ref 4.0–10.5)

## 2018-06-06 LAB — HEPATITIS PANEL, ACUTE
HCV Ab: <0.1 s/co ratio (ref 0.0–0.9)
Hep A IgM: NEGATIVE
Hep B C IgM: NEGATIVE
Hepatitis B Surface Ag: NEGATIVE

## 2018-06-06 LAB — MAGNESIUM: Magnesium: 1.5 mg/dL — ABNORMAL LOW (ref 1.7–2.4)

## 2018-06-06 LAB — MRSA PCR SCREENING: MRSA by PCR: NEGATIVE

## 2018-06-06 MED ORDER — LORAZEPAM 2 MG/ML IJ SOLN
2.0000 mg | INTRAMUSCULAR | Status: AC
  Administered 2018-06-06: 2 mg via INTRAVENOUS
  Filled 2018-06-06: qty 1

## 2018-06-06 MED ORDER — LORAZEPAM 2 MG/ML IJ SOLN
2.0000 mg | INTRAMUSCULAR | Status: DC | PRN
  Administered 2018-06-06 (×3): 3 mg via INTRAVENOUS
  Administered 2018-06-06: 2 mg via INTRAVENOUS
  Administered 2018-06-07: 3 mg via INTRAVENOUS
  Administered 2018-06-07: 2 mg via INTRAVENOUS
  Filled 2018-06-06 (×3): qty 2
  Filled 2018-06-06: qty 1
  Filled 2018-06-06 (×2): qty 2
  Filled 2018-06-06: qty 1
  Filled 2018-06-06: qty 2

## 2018-06-06 MED ORDER — MAGNESIUM SULFATE 2 GM/50ML IV SOLN
2.0000 g | Freq: Once | INTRAVENOUS | Status: AC
  Administered 2018-06-06: 2 g via INTRAVENOUS
  Filled 2018-06-06: qty 50

## 2018-06-06 MED ORDER — HALOPERIDOL LACTATE 5 MG/ML IJ SOLN
5.0000 mg | Freq: Once | INTRAMUSCULAR | Status: AC
  Administered 2018-06-06: 5 mg via INTRAVENOUS
  Filled 2018-06-06: qty 1

## 2018-06-06 MED ORDER — DEXMEDETOMIDINE HCL IN NACL 400 MCG/100ML IV SOLN
0.4000 ug/kg/h | INTRAVENOUS | Status: DC
  Administered 2018-06-06: 1.2 ug/kg/h via INTRAVENOUS
  Administered 2018-06-06 (×3): 1 ug/kg/h via INTRAVENOUS
  Administered 2018-06-06: 0.6 ug/kg/h via INTRAVENOUS
  Administered 2018-06-07: 0.8 ug/kg/h via INTRAVENOUS
  Administered 2018-06-07: 1 ug/kg/h via INTRAVENOUS
  Administered 2018-06-07: 1.2 ug/kg/h via INTRAVENOUS
  Filled 2018-06-06 (×6): qty 100

## 2018-06-06 MED ORDER — DEXMEDETOMIDINE HCL IN NACL 400 MCG/100ML IV SOLN
INTRAVENOUS | Status: AC
  Administered 2018-06-06: 1 ug/kg/h via INTRAVENOUS
  Filled 2018-06-06: qty 100

## 2018-06-06 MED ORDER — METOPROLOL TARTRATE 5 MG/5ML IV SOLN
5.0000 mg | Freq: Three times a day (TID) | INTRAVENOUS | Status: DC
  Administered 2018-06-06 – 2018-06-08 (×6): 5 mg via INTRAVENOUS
  Filled 2018-06-06 (×6): qty 5

## 2018-06-06 MED ORDER — SODIUM CHLORIDE 0.45 % IV SOLN
INTRAVENOUS | Status: DC
Start: 2018-06-06 — End: 2018-06-09
  Administered 2018-06-06 – 2018-06-09 (×5): via INTRAVENOUS

## 2018-06-06 MED ORDER — ORAL CARE MOUTH RINSE
15.0000 mL | Freq: Two times a day (BID) | OROMUCOSAL | Status: DC
  Administered 2018-06-06 – 2018-06-11 (×10): 15 mL via OROMUCOSAL

## 2018-06-06 MED ORDER — WARFARIN SODIUM 5 MG PO TABS: 5.0000 mg | ORAL_TABLET | Freq: Once | ORAL | Status: DC

## 2018-06-06 MED ORDER — WARFARIN SODIUM 7.5 MG PO TABS
7.5000 mg | ORAL_TABLET | Freq: Once | ORAL | Status: AC
  Administered 2018-06-06: 7.5 mg via ORAL
  Filled 2018-06-06: qty 1

## 2018-06-06 NOTE — Care Management (Signed)
CM provided [facility] information on AVS for assistance with orange card.   Pt currently on Precedex drip - CM will follow back up with pt when pt is able to discuss needs

## 2018-06-06 NOTE — Progress Notes (Addendum)
ANTICOAGULATION CONSULT NOTE   Pharmacy Consult for Coumadin Indication: h/o PE and mitral valve repair  No Known Allergies  Patient Measurements: Height: 5\' 9"  (175.3 cm) Weight: 187 lb 12.8 oz (85.2 kg) IBW/kg (Calculated) : 70.7  Vital Signs: BP: 133/99 (07/30 0800) Pulse Rate: 72 (07/30 0800)  Labs: Recent Labs    06/04/18 1519 06/04/18 1528 06/05/18 0203 06/05/18 0618 06/05/18 1219 06/06/18 0413  HGB 12.4*  --   --  12.6*  --  11.7*  HCT 36.6*  --   --  37.7*  --  34.7*  PLT 111*  --   --  104*  --  89*  LABPROT 36.6*  --   --  27.8*  --  24.8*  INR 3.73  --   --  2.62  --  2.26  CREATININE 0.89  --   --  1.04  --  0.99  CKTOTAL  --  262  --   --   --   --   TROPONINI <0.03  --  <0.03 0.03* <0.03  --    Assessment: 57yo male c/o tremors, dizziness, and nausea, admitted for further w/u, to continue Coumadin for h/o PE and mitral valve repair.   Admitting INR above goal (3.77) but dow to 2.2 today. Dose was held 7/28. His CBC is stable. No bleeding issues noted overnight.   Goal of Therapy:  INR 2-3 per outpt [facility] clinic notes   Plan:  Will give Warfarin 5mg  x 1  F/U AM labs and adjust as needed.  Erin Hearing PharmD., BCPS Clinical Pharmacist 06/06/2018 9:30 AM

## 2018-06-06 NOTE — Progress Notes (Signed)
CSW received consult to assist pt with getting orange card- CSW not involved in this process.  CSW requested assistance from [facility] who is following up regarding how pt would get orange card and will discuss with pt.  CSW signing off- please reconsult if CSW needs arise  Jorge Ny, LCSW Clinical Social Worker [phone]

## 2018-06-06 NOTE — Progress Notes (Signed)
PT Cancellation Note  Patient Details Name: Leonard Bentley MRN: [id number] DOB: 1961/05/18   Cancelled Treatment:    Reason Eval/Treat Not Completed: Medical issues which prohibited therapy(pt remains on precedex drip and unresponsive, not appropriate at this time)   Meagen Limones B Toriann Spadoni 06/06/2018, 11:43 AM  Elwyn Reach, [place]

## 2018-06-06 NOTE — Progress Notes (Addendum)
Patient is very anxious and agitated MD notified, another 2mg  ativan IV given. Patient still having  tachypnea Will continue to monitor.

## 2018-06-06 NOTE — Consult Note (Signed)
PULMONARY / CRITICAL CARE MEDICINE  Name: Leonard Bentley MRN: [id number] DOB: 1961-03-11    ADMISSION DATE:  06/04/2018 CONSULTATION DATE:  06/06/18  REFERRING MD :  Jonnie Finner  CHIEF COMPLAINT:  EtOH withdrawal   HISTORY OF PRESENT ILLNESS:  Robinson Brinkley Fritcher is a 57 y.o. male with a PMH as outlined below including but not limited to recurrent PE (on coumadin), MV repair in Dec 2017, EtOH abuse, non-hodgkins lymphoma currently in remision.  He was admitted 7/28 with EtOH withdrawal and exertional dyspnea.  He apparently lost a job 1 month ago and has not been doing well since that time / event.  He was treated with ativan per CIWA; however, early AM 7/30 he became extremely agitated and required transfer to ICU with initiation of precedex.  PCCM subsequently consulted.   PAST MEDICAL HISTORY :   has a past medical history of Acute pulmonary embolism ([place]) (04/11/2016), Anxiety, Arthritis, Atrial tachycardia ([place]) (01/06/2016), Cancer ([place]) (07/2011), Depression, Dyspnea, ED (erectile dysfunction), Hypertension, Lymphoma, non Hodgkin's (09/25/2011), Mitral regurgitation (01/20/2016), [place]itz I (10/09/2015), Murmur (01/06/2016), Occasional tremors, PAC (premature atrial contraction) (10/09/2015), and S/P minimally-invasive mitral valve repair (10/27/2016).  has a past surgical history that includes No past surgeries; Trigger finger release (Bilateral); TEE without cardioversion (N/A, 02/11/2016); Cardiac catheterization (N/A, 08/13/2016); Mitral valve repair (Right, 10/27/2016); TEE without cardioversion (N/A, 10/27/2016); and Skin biopsy (Right, 04/04/2018). Prior to Admission medications   Medication Sig Start Date End Date Taking? Authorizing Provider  diclofenac sodium (VOLTAREN) 1 % GEL Apply two grams. Use four times daily for pain Patient taking differently: Apply 1 g topically 2 (two) times daily as needed (for pain).  02/21/18  Yes Aundra Dubin, PA-C  tetrahydrozoline (REDNESS RELIEVER EYE DROPS) 0.05 %  ophthalmic solution Place 1 drop into both eyes daily as needed (red eyes).   Yes [provider]  warfarin (COUMADIN) 5 MG tablet Take 1 tablet by mouth daily or as directed by coumadin clinic Patient taking differently: Take 5 mg by mouth every other Depaz. clinic 04/14/18  Yes [name], MD  meclizine (ANTIVERT) 25 MG tablet Take 1 tablet (25 mg total) by mouth 3 (three) times daily as needed for dizziness. Patient not taking: Reported on 06/04/2018 04/24/18   McDonald, Mia A, PA-C  triamcinolone cream (KENALOG) 0.1 % Apply 1 application topically 2 (two) times daily. Patient not taking: Reported on 02/21/2018 02/13/18   Tysinger, Camelia Eng, PA-C   No Known Allergies  FAMILY HISTORY:  family history includes Cancer in his father and mother; Hypertension in his maternal grandmother; Stroke in his maternal aunt. SOCIAL HISTORY:  reports that he quit smoking about 4 years ago. His smoking use included cigarettes and e-cigarettes. He has a 32.00 pack-year smoking history. He uses smokeless tobacco. He reports that he drinks alcohol. He reports that he does not use drugs.  REVIEW OF SYSTEMS:  Unable to obtain as pt is asleep.   SUBJECTIVE: Precedex started, currently at 1.  Pt resting comfortably.  VITAL SIGNS: Temp:  [97.7 F (36.5 C)-98.4 F (36.9 C)] 97.8 F (36.6 C) (07/29 1940) Pulse Rate:  [72-144] 113 (07/30 0330) Resp:  [18-27] 22 (07/30 0330) BP: (101-147)/(78-107) 127/107 (07/30 0300) SpO2:  [97 %-100 %] 99 % (07/30 0330) Weight:  [85.2 kg (187 lb 12.8 oz)] 85.2 kg (187 lb 12.8 oz) (07/29 0616)  PHYSICAL EXAMINATION: General: Adult male, resting in bed, in NAD. Neuro: Asleep. HEENT: [place]/AT, MMM. Cardiovascular: RRR, no M/R/G.  Lungs: Respirations even and  unlabored.  CTA bilaterally, No W/R/R. Abdomen: BS x 4, soft, NT/ND.  Musculoskeletal: No gross deformities, no edema.  Skin: Intact, warm, no rashes.   Recent Labs  Lab 06/04/18 1519 06/05/18 0618  NA 139  138  K 4.1 3.6  CL 102 98  CO2 22 26  BUN 9 8  CREATININE 0.89 1.04  GLUCOSE 91 88   Recent Labs  Lab 06/04/18 1519 06/05/18 0618 06/06/18 0413  HGB 12.4* 12.6* 11.7*  HCT 36.6* 37.7* 34.7*  WBC 3.3* 2.6* 4.1  PLT 111* 104* 89*   Ct Angio Chest Pe W Or Wo Contrast  Result Date: 06/05/2018 CLINICAL DATA:  Shortness of breath. History of pulmonary embolism, mitral valve repair, non-Hodgkin's lymphoma. EXAM: CT ANGIOGRAPHY CHEST WITH CONTRAST TECHNIQUE: Multidetector CT imaging of the chest was performed using the standard protocol during bolus administration of intravenous contrast. Multiplanar CT image reconstructions and MIPs were obtained to evaluate the vascular anatomy. CONTRAST:  <See Chart> ISOVUE-370 IOPAMIDOL (ISOVUE-370) INJECTION 76% COMPARISON:  Chest CT September 21, 2017 FINDINGS: CARDIOVASCULAR: Adequate contrast opacification of the pulmonary artery's. Main pulmonary artery is not enlarged. No pulmonary arterial filling defects to the level of the subsegmental branches. Moderate cardiomegaly. No pericardial effusion. Mitral valve replacement. No pericardial effusion. Thoracic aorta is normal course and caliber, unremarkable. MEDIASTINUM/NODES: Similar mediastinal and hilar lymphadenopathy. LUNGS/PLEURA: Tracheobronchial tree is patent, no pneumothorax. Innumerable subcentimeter pulmonary nodules similar to prior CT. No pleural effusion or focal consolidation. RIGHT middle lobe atelectasis/scarring. UPPER ABDOMEN: Nonacute.  Hepatic steatosis and calcified granuloma. MUSCULOSKELETAL: Nonacute.  Moderate thoracic spondylosis. Review of the MIP images confirms the above findings. IMPRESSION: 1. No acute pulmonary embolism. 2. Stable mediastinal and hilar lymphadenopathy. 3. Similar innumerable small pulmonary nodules. Electronically Signed   By: Elon Alas M.D.   On: 06/05/2018 04:19    STUDIES:  CTA chest 7/28 > neg for PE  SIGNIFICANT EVENTS  7/28 > admit. 7/30 >  transfer to ICU and started on precedex.  ASSESSMENT / PLAN:  EtOH withdrawal - initially treated with ativan per CIWA; however, transitioned to precedex early Am 7/30 due to extreme agitation. Plan: Cont precedex gtt with PRN ativan. Thiamine / Folate. EtOH cessation counseling.  Hx recurrent PE - on coumadin.  CTA on admit neg for PE. Plan: Continue coumadin.  Chronic combined heart failure - echo from April 2018 with 50 - 55%, G1DD. Hx MV repair. Plan: Cardiology following.  Hypomagnesemia. Plan: 2g Mag.  Hematuria. Plan: F/u with urology as an outpatient.   Montey Hora, [place] Pager: [phone]  or [phone] 06/06/2018, 4:47 AM

## 2018-06-06 NOTE — Progress Notes (Signed)
Progress Note  Patient Name: Leonard Bentley Date of Encounter: 06/06/2018  Primary Cardiologist: [name], MD   Subjective   Unable to obtain.  Patient sedated and sleeping.   Inpatient Medications    Scheduled Meds: . escitalopram  10 mg Oral Daily  . folic acid  1 mg Oral Daily  . multivitamin with minerals  1 tablet Oral Daily  . thiamine  100 mg Oral Daily   Or  . thiamine  100 mg Intravenous Daily  . Warfarin - Pharmacist Dosing Inpatient   Does not apply q1800   Continuous Infusions: . dexmedetomidine (PRECEDEX) IV infusion 1.2 mcg/kg/hr (06/06/18 0800)   PRN Meds: diclofenac sodium, LORazepam **OR** LORazepam, LORazepam, ondansetron **OR** ondansetron (ZOFRAN) IV   Vital Signs    Vitals:   06/06/18 0620 06/06/18 0630 06/06/18 0700 06/06/18 0800  BP:  (!) 159/119 (!) 150/113 (!) 133/99  Pulse: (!) 131 63 75 72  Resp:  (!) 37 (!) 24 (!) 27  Temp:      TempSrc:      SpO2:  100% 96% 94%  Weight:      Height:        Intake/Output Summary (Last 24 hours) at 06/06/2018 0919 Last data filed at 06/06/2018 0800 Gross per 24 hour  Intake 1596.56 ml  Output 140 ml  Net 1456.56 ml   Filed Weights   06/04/18 1422 06/04/18 2300 06/05/18 0616  Weight: 190 lb (86.2 kg) 190 lb 14.4 oz (86.6 kg) 187 lb 12.8 oz (85.2 kg)    Telemetry    Sinus rhythm, sinus tachycardia.  Rate 60s-140s.  Blocked PACs.   - Personally Reviewed  ECG    n/a  Physical Exam   VS:  BP (!) 133/99 (BP Location: Right Arm)   Pulse 72   Temp 97.8 F (36.6 C) (Oral)   Resp (!) 27   Ht 5\' 9"  (1.753 m)   Wt 187 lb 12.8 oz (85.2 kg)   SpO2 94%   BMI 27.73 kg/m  , BMI Body mass index is 27.73 kg/m. GENERAL:  Lying flat in bed sleeping and minimally arousable. HEENT: NCAT NECK:  No jugular venous distention, waveform within normal limits, carotid upstroke brisk and symmetric, no bruits LUNGS:  Clear to auscultation bilaterally HEART:  RRR.  PMI not displaced or sustained,S1 and S2  within normal limits, no S3, no S4, no clicks, no rubs, no murmurs ABD:  Flat, positive bowel sounds normal in frequency in pitch, no bruits, no rebound, no guarding, no midline pulsatile mass, no hepatomegaly, no splenomegaly EXT:  2 plus pulses throughout, no edema, no cyanosis no clubbing SKIN:  No rashes no nodules NEURO:  Unable to assess.  Sedated. PSYCH:  Unable to assess.  Sedated.   Labs    Chemistry Recent Labs  Lab 06/04/18 1519 06/04/18 1528 06/05/18 0618 06/06/18 0413  NA 139  --  138 137  K 4.1  --  3.6 4.1  CL 102  --  98 103  CO2 22  --  26 24  GLUCOSE 91  --  88 129*  BUN 9  --  8 10  CREATININE 0.89  --  1.04 0.99  CALCIUM 8.6*  --  8.7* 8.6*  PROT  --  7.1 6.8  --   ALBUMIN  --  3.7 3.8  --   AST  --  172* 192*  --   ALT  --  110* 113*  --   ALKPHOS  --  66 68  --   BILITOT  --  1.2 1.9*  --   GFRNONAA >60  --  >60 >60  GFRAA >60  --  >60 >60  ANIONGAP 15  --  14 10     Hematology Recent Labs  Lab 06/04/18 1519 06/05/18 0618 06/06/18 0413  WBC 3.3* 2.6* 4.1  RBC 4.76 4.87 4.49  HGB 12.4* 12.6* 11.7*  HCT 36.6* 37.7* 34.7*  MCV 76.9* 77.4* 77.3*  MCH 26.1 25.9* 26.1  MCHC 33.9 33.4 33.7  RDW 17.8* 18.4* 17.5*  PLT 111* 104* 89*    Cardiac Enzymes Recent Labs  Lab 06/04/18 1519 06/05/18 0203 06/05/18 0618 06/05/18 1219  TROPONINI <0.03 <0.03 0.03* <0.03    Recent Labs  Lab 06/04/18 1528  TROPIPOC 0.01     BNPNo results for input(s): BNP, PROBNP in the last 168 hours.   DDimer No results for input(s): DDIMER in the last 168 hours.   Radiology    Ct Angio Chest Pe W Or Wo Contrast  Result Date: 06/05/2018 CLINICAL DATA:  Shortness of breath. History of pulmonary embolism, mitral valve repair, non-Hodgkin's lymphoma. EXAM: CT ANGIOGRAPHY CHEST WITH CONTRAST TECHNIQUE: Multidetector CT imaging of the chest was performed using the standard protocol during bolus administration of intravenous contrast. Multiplanar CT image  reconstructions and MIPs were obtained to evaluate the vascular anatomy. CONTRAST:  <See Chart> ISOVUE-370 IOPAMIDOL (ISOVUE-370) INJECTION 76% COMPARISON:  Chest CT September 21, 2017 FINDINGS: CARDIOVASCULAR: Adequate contrast opacification of the pulmonary artery's. Main pulmonary artery is not enlarged. No pulmonary arterial filling defects to the level of the subsegmental branches. Moderate cardiomegaly. No pericardial effusion. Mitral valve replacement. No pericardial effusion. Thoracic aorta is normal course and caliber, unremarkable. MEDIASTINUM/NODES: Similar mediastinal and hilar lymphadenopathy. LUNGS/PLEURA: Tracheobronchial tree is patent, no pneumothorax. Innumerable subcentimeter pulmonary nodules similar to prior CT. No pleural effusion or focal consolidation. RIGHT middle lobe atelectasis/scarring. UPPER ABDOMEN: Nonacute.  Hepatic steatosis and calcified granuloma. MUSCULOSKELETAL: Nonacute.  Moderate thoracic spondylosis. Review of the MIP images confirms the above findings. IMPRESSION: 1. No acute pulmonary embolism. 2. Stable mediastinal and hilar lymphadenopathy. 3. Similar innumerable small pulmonary nodules. Electronically Signed   By: Elon Alas M.D.   On: 06/05/2018 04:19    Cardiac Studies   Echo 02/22/17: Study Conclusions  - Left ventricle: The cavity size was normal. There was mild   concentric hypertrophy. Systolic function was normal. The   estimated ejection fraction was in the range of 50% to 55%. Wall   motion was normal; there were no regional wall motion   abnormalities. Doppler parameters are consistent with abnormal   left ventricular relaxation (grade 1 diastolic dysfunction).   Doppler parameters are consistent with indeterminate ventricular   filling pressure. - Aortic valve: Transvalvular velocity was within the normal range.   There was no stenosis. There was no regurgitation. - Mitral valve: Prior procedures included surgical repair.    Transvalvular velocity was within the normal range. There was no   evidence for stenosis. There was no regurgitation. - Left atrium: The atrium was moderately dilated. - Right ventricle: Systolic function was normal. - Atrial septum: No defect or patent foramen ovale was identified   by color flow Doppler. - Tricuspid valve: There was mild regurgitation. - Pulmonary arteries: Systolic pressure was within the normal   range. PA peak pressure: 15 mm Hg (S).  Impressions:  - Since echo 0/8676, systolic function has improved and there is no   evidence of ascending aorta  aneurysm.  Patient Profile     Mr. Warf is a 8M with chronic systolic and diastolic heart failure (LVEF improved to 50-55%), s/p mitral valve repair, prior PE on warfarin, non-Hodgkin's lymphoma in remission, and EtOH/tobaccos abuse here with weakness, leg pain and shortness of breath in the setting of EtOH withdrawal.  Assessment & Plan    # EtOH withdrawal: He became acutely agitated and combative overnight Requiring sedation with Haldol, lorazepam, and Precedex. Per IM.  # Anxiety/depression: Started on lexapro this admission.  # s/p mitral valve repair: Mr. Hulsebus appears stable on exam but reports increasing dyspnea on exertion.  Echo pending.  # Prior PE on warfarin:  Will need to ensure he is getting INR checks at follow up.  He was missing appointments due to financial cost.  Case management is involved.  ?Orange card.   [facility] will follow from [place]. Medication Recommendations:  n/a Other recommendations (labs, testing, etc):  Echo pending Follow up as an outpatient:  As scheduled  For questions or updates, please contact [facility] Please consult [email or web address] for contact info under Cardiology/STEMI.      Signed, [name], MD  06/06/2018, 9:19 AM

## 2018-06-06 NOTE — Progress Notes (Addendum)
Shift event: NP paged by RRRN due to pt's agitation and signs of ETOH withdrawal. CIWA 35. Total of Ativan 4mg  and 5mg  haldol given and pt calmed down. Transferred pt to [facility] step down overflow in case a Precedex drip was needed. SDU CIWA Ativan orders placed. VSS.  Pt sleeping when NP went to room. Did not wake him. Asked the RN to call for any issues or if Ativan is not helping.  KJKG, NP [facility] Addendum: 10mg  Ativan and 10 mg Haldol have not helped for much time. Pt awakened and 4 RNs were trying to keep him in the bed. He is tremulous and HR goes up with CIWA score. PCCM called for Precedex orders and will see the pt at some point.   Total critical care time: 15 minutes Critical care time was exclusive of separately billable procedures and treating other patients. Critical care was necessary to treat or prevent imminent or life-threatening deterioration. Critical care was time spent personally by me on the following activities: development of treatment plan with patient and/or surrogate as well as nursing, discussions with consultants, evaluation of patient's response to treatment, examination of patient, obtaining history from patient or surrogate, ordering and performing treatments and interventions, ordering and review of laboratory studies, ordering and review of radiographic studies, pulse oximetry and re-evaluation of patient's condition.

## 2018-06-06 NOTE — Significant Event (Signed)
Rapid Response Event Note  Overview: CIWA   Initial Focused Assessment: Called by 3E RN about patient being very altered, tachypneic, and confused. I was with another patient and asked that RN can TRH NP and make NP aware and that I would come as soon as I could. When I arrived, CIWA was 35 (see flowsheet), HR 150-160s, only VS I could obtain, patient was given total of 3mg  Ativan IV with no improvement. TRH NP ordered 5mg  Haldol IV and transfer orders to PCU for SDU CIWA orders.  Interventions: - Transfer to ICU as SDU overflow  Plan of Care:  - Transfer to ICU as SDU overflow  Event Summary:   at     Call Time 0012 Arrival Time 0045 End Time [place], [place]

## 2018-06-06 NOTE — Plan of Care (Signed)
Patient becoming increasing agitated.  Able to state that he is in [place], and that he drinks at least 2 bottles of red wine a [place].  Keeps saying that he has to get out of here.  Exhibiting severe tremors, with mild hallucinations.   Patient given Ativan and increased Precidex as charted.  Sitter at bedside to maintain safety.  Will continue to monitor and evaluate.
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
## 2018-06-07 ENCOUNTER — Encounter (HOSPITAL_COMMUNITY): Payer: Self-pay | Admitting: *Deleted

## 2018-06-07 DIAGNOSIS — I2699 Other pulmonary embolism without acute cor pulmonale: Secondary | ICD-10-CM

## 2018-06-07 DIAGNOSIS — K7010 Alcoholic hepatitis without ascites: Secondary | ICD-10-CM

## 2018-06-07 DIAGNOSIS — F10231 Alcohol dependence with withdrawal delirium: Principal | ICD-10-CM

## 2018-06-07 LAB — AMMONIA: Ammonia: 26 umol/L (ref 9–35)

## 2018-06-07 LAB — GLUCOSE, CAPILLARY
Glucose-Capillary: 122 mg/dL — ABNORMAL HIGH (ref 70–99)
Glucose-Capillary: 100 mg/dL — ABNORMAL HIGH (ref 70–99)
Glucose-Capillary: 108 mg/dL — ABNORMAL HIGH (ref 70–99)
Glucose-Capillary: 123 mg/dL — ABNORMAL HIGH (ref 70–99)
Glucose-Capillary: 103 mg/dL — ABNORMAL HIGH (ref 70–99)
Glucose-Capillary: 96 mg/dL (ref 70–99)
Glucose-Capillary: 109 mg/dL — ABNORMAL HIGH (ref 70–99)

## 2018-06-07 LAB — BASIC METABOLIC PANEL
Anion gap: 13 (ref 5–15)
BUN: 10 mg/dL (ref 6–20)
CO2: 20 mmol/L — ABNORMAL LOW (ref 22–32)
Calcium: 8.7 mg/dL — ABNORMAL LOW (ref 8.9–10.3)
Chloride: 105 mmol/L (ref 98–111)
Creatinine, Ser: 0.91 mg/dL (ref 0.61–1.24)
GFR calc Af Amer: >60 mL/min (ref >60)
GFR calc non Af Amer: >60 mL/min (ref >60)
Glucose, Bld: 105 mg/dL — ABNORMAL HIGH (ref 70–99)
Potassium: 4.3 mmol/L (ref 3.5–5.1)
Sodium: 138 mmol/L (ref 135–145)

## 2018-06-07 LAB — CBC
HCT: 36.6 % — ABNORMAL LOW (ref 39.0–52.0)
Hemoglobin: 12.3 g/dL — ABNORMAL LOW (ref 13.0–17.0)
MCH: 26.2 pg (ref 26.0–34.0)
MCHC: 33.6 g/dL (ref 30.0–36.0)
MCV: 77.9 fL — ABNORMAL LOW (ref 78.0–100.0)
Platelets: 101 10*3/uL — ABNORMAL LOW (ref 150–400)
RBC: 4.7 MIL/uL (ref 4.22–5.81)
RDW: 18.9 % — ABNORMAL HIGH (ref 11.5–15.5)
WBC: 4 10*3/uL (ref 4.0–10.5)

## 2018-06-07 LAB — PROTIME-INR
INR: 2.03
Prothrombin Time: 22.8 seconds — ABNORMAL HIGH (ref 11.4–15.2)

## 2018-06-07 LAB — PHOSPHORUS: Phosphorus: 2.9 mg/dL (ref 2.5–4.6)

## 2018-06-07 LAB — MAGNESIUM: Magnesium: 1.7 mg/dL (ref 1.7–2.4)

## 2018-06-07 MED ORDER — CHLORDIAZEPOXIDE HCL 5 MG PO CAPS
25.0000 mg | ORAL_CAPSULE | Freq: Four times a day (QID) | ORAL | Status: AC
  Administered 2018-06-07 – 2018-06-08 (×4): 25 mg via ORAL
  Filled 2018-06-07 (×4): qty 5

## 2018-06-07 MED ORDER — CHLORDIAZEPOXIDE HCL 25 MG PO CAPS
25.0000 mg | ORAL_CAPSULE | ORAL | Status: AC
  Administered 2018-06-09 – 2018-06-10 (×2): 25 mg via ORAL
  Filled 2018-06-07 (×2): qty 1

## 2018-06-07 MED ORDER — CHLORDIAZEPOXIDE HCL 5 MG PO CAPS
50.0000 mg | ORAL_CAPSULE | Freq: Once | ORAL | Status: AC
  Administered 2018-06-07: 50 mg via ORAL
  Filled 2018-06-07: qty 10

## 2018-06-07 MED ORDER — WARFARIN SODIUM 7.5 MG PO TABS
7.5000 mg | ORAL_TABLET | Freq: Once | ORAL | Status: AC
  Administered 2018-06-07: 7.5 mg via ORAL
  Filled 2018-06-07: qty 1

## 2018-06-07 MED ORDER — CHLORDIAZEPOXIDE HCL 5 MG PO CAPS
25.0000 mg | ORAL_CAPSULE | Freq: Three times a day (TID) | ORAL | Status: AC
  Administered 2018-06-08 – 2018-06-09 (×3): 25 mg via ORAL
  Filled 2018-06-07 (×3): qty 5

## 2018-06-07 MED ORDER — CHLORDIAZEPOXIDE HCL 25 MG PO CAPS
25.0000 mg | ORAL_CAPSULE | Freq: Every day | ORAL | Status: AC
  Administered 2018-06-10: 25 mg via ORAL
  Filled 2018-06-07: qty 1

## 2018-06-07 MED ORDER — CHLORDIAZEPOXIDE HCL 5 MG PO CAPS: 25.0000 mg | ORAL_CAPSULE | Freq: Every day | ORAL | Status: DC

## 2018-06-07 MED ORDER — CHLORDIAZEPOXIDE HCL 5 MG PO CAPS: 25.0000 mg | ORAL_CAPSULE | Freq: Four times a day (QID) | ORAL | Status: DC

## 2018-06-07 MED ORDER — CHLORDIAZEPOXIDE HCL 25 MG PO CAPS: 25.0000 mg | ORAL_CAPSULE | Freq: Four times a day (QID) | ORAL | Status: AC | PRN

## 2018-06-07 MED ORDER — CHLORDIAZEPOXIDE HCL 5 MG PO CAPS: 25.0000 mg | ORAL_CAPSULE | Freq: Three times a day (TID) | ORAL | Status: DC

## 2018-06-07 MED ORDER — CHLORDIAZEPOXIDE HCL 5 MG PO CAPS: 25.0000 mg | ORAL_CAPSULE | ORAL | Status: DC

## 2018-06-07 NOTE — Plan of Care (Signed)

## 2018-06-07 NOTE — Progress Notes (Signed)
PT Cancellation Note  Patient Details Name: Leonard Bentley MRN: [id number] DOB: August 11, 1961   Cancelled Treatment:     Pt remains lethargic, will cont to follow. RN requests we re-attempt tomorrow.   Reinaldo Berber, PT, DPT Acute Rehab Services Pager: [phone]     Reinaldo Berber 06/07/2018, 4:56 PM

## 2018-06-07 NOTE — Progress Notes (Signed)
Leonard Bentley for Coumadin Indication: h/o PE and mitral valve repair  No Known Allergies  Patient Measurements: Height: 5\' 9"  (175.3 cm) Weight: 184 lb 4.9 oz (83.6 kg) IBW/kg (Calculated) : 70.7  Vital Signs: Temp: 98.2 F (36.8 C) (07/31 0731) Temp Source: Oral (07/31 0731) BP: 117/95 (07/31 0800) Pulse Rate: 82 (07/31 0800)  Labs: Recent Labs    06/04/18 1528 06/05/18 0203 06/05/18 0618 06/05/18 1219 06/06/18 0413 06/07/18 0410 06/07/18 0730  HGB  --   --  12.6*  --  11.7*  --  12.3*  HCT  --   --  37.7*  --  34.7*  --  36.6*  PLT  --   --  104*  --  89*  --  101*  LABPROT  --   --  27.8*  --  24.8* 22.8*  --   INR  --   --  2.62  --  2.26 2.03  --   CREATININE  --   --  1.04  --  0.99 0.91  --   CKTOTAL 262  --   --   --   --   --   --   TROPONINI  --  <0.03 0.03* <0.03  --   --   --    Assessment: 57yo male c/o tremors, dizziness, and nausea, admitted for further w/u, to continue Coumadin for h/o PE and mitral valve repair.   Admitting INR above goal (3.77) but dow to 2.0 today. Dose was held 7/28. His CBC is stable. Higher dose given last night. No bleeding issues noted overnight.   Goal of Therapy:  INR 2-3 per outpt [facility] clinic notes   Plan:  Will give Warfarin 7.5mg  again tonight  F/U AM labs and adjust as needed.  Erin Hearing PharmD., BCPS Clinical Pharmacist 06/07/2018 9:16 AM

## 2018-06-07 NOTE — Progress Notes (Signed)
PROGRESS NOTE    Leonard Bentley  UYQ:[id number] DOB: July 26, 1961 DOA: 06/04/2018 PCP: [name], MD      Brief Narrative:  Mr. Yearwood is a 57 y.o. M with hx alcoholism, PE on warfarin, chronic systolic and diastolic CHF and MVR 6387 who presents with 3 weeks progressive dyspnea on exertion, found to be withdrawing heavily from alcohol.  Overnight 7/29, patient developed delirium, required initiation of Precedex and ICU transfer.     Assessment & Plan:  Delirium tremens Alcohol withdrawal -Continue Precedex -Continue Librium taper  -Stop SSRI, this is not a home med   Acute on chronic systolic and diastolic CHF EF 50 to 56% in April 2018.  Appears euvolemic at this time. -Strict I/Os, daily weights  Transaminitis Tylenol undetectable, hepatitis panel negative, HIV negative.  Presumably from alcohol. -Trend LFTs  Hypertension -Continue IV metoprolol for now  History of recurrent VTE -Continue warfarin -Daily INR  Smoking -We will recommend smoking cessation when patient more alert  Remote history of lymphoma CT angiogram shows unchanged hilar and mediastinal lymphadenopathy and pulmonary nodules.  Pancytopenia Suspect this is from alcoholism.  No signs of blood loss.  Hemoglobin stable.   DVT prophylaxis: SCDs Code Status: FULL Family Communication: None present MDM and disposition Plan: The below labs and imaging reports were reviewed and summarized above.    The patient was admitted with dyspnea, found to have alcohol withdrawal progressing to delirium.  Now on Precedex.  Will need PT eval after off Precedex, then possible placement.   Consultants:   CCM  Procedures:   None  Antimicrobials:   None    Subjective: No fever overnight, no respiratory distress.  Disoriented still, as he is in the hospital, thinks that he has something in his hand, cannot feel his feet, unsure of who I am, what he is doing.  No cough, sputum, dyspnea.  No vmoiting,  diarrhea.  Objective: Vitals:   06/07/18 1100 06/07/18 1200 06/07/18 1300 06/07/18 1400  BP: 95/82 105/90 (!) 107/94 (!) 109/97  Pulse: 70 67 66 65  Resp: (!) 28 (!) 27 (!) 31 (!) 27  Temp:  99.2 F (37.3 C)    TempSrc:  Axillary    SpO2: 97% 98% 96% 99%  Weight:      Height:        Intake/Output Summary (Last 24 hours) at 06/07/2018 1519 Last data filed at 06/07/2018 1400 Gross per 24 hour  Intake 2018.64 ml  Output 975 ml  Net 1043.64 ml   Filed Weights   06/04/18 2300 06/05/18 0616 06/07/18 0500  Weight: 86.6 kg (190 lb 14.4 oz) 85.2 kg (187 lb 12.8 oz) 83.6 kg (184 lb 4.9 oz)    Examination: General appearance:  adult male, sleeping, rousable, in no obvious distress.   HEENT: Anicteric, conjunctiva pink, lids and lashes normal. No nasal deformity, discharge, epistaxis.  Lips moist, teeth normal, oropharynx dry, no oral lesions, hearing normal.   Skin: Warm and dry.  no jaundice.  No suspicious rashes or lesions. Cardiac: Tachycardic, regular, nl S1-S2, no murmurs appreciated.  Capillary refill is brisk.  JVP normal.  No LE edema.  Radia  pulses 2+ and symmetric. Respiratory: Normal respiratory rate and rhythm.  CTAB without rales or wheezes. Abdomen: Abdomen soft.  no TTP. No ascites, distension, hepatosplenomegaly.   MSK: No deformities or effusions. Neuro: Sleeping but rousable, oriented to hospital, year, that is all.  Disoriented about why he is here, who I am, who is in  room with him, what he has in his hands (nothing).  PERRL, moves upper extremities symmetrically but globally weak. Speech fluent.    Psych: Responding to questions, but sedated.  Attention diminished.  Affect blunted.      Data Reviewed: I have personally reviewed following labs and imaging studies:  CBC: Recent Labs  Lab 06/04/18 1519 06/05/18 0618 06/06/18 0413 06/07/18 0730  WBC 3.3* 2.6* 4.1 4.0  HGB 12.4* 12.6* 11.7* 12.3*  HCT 36.6* 37.7* 34.7* 36.6*  MCV 76.9* 77.4* 77.3* 77.9*  PLT  111* 104* 89* 829*   Basic Metabolic Panel: Recent Labs  Lab 06/04/18 1519 06/05/18 0618 06/06/18 0413 06/06/18 0613 06/07/18 0410  NA 139 138 137  --  138  K 4.1 3.6 4.1  --  4.3  CL 102 98 103  --  105  CO2 22 26 24   --  20*  GLUCOSE 91 88 129*  --  105*  BUN 9 8 10   --  10  CREATININE 0.89 1.04 0.99  --  0.91  CALCIUM 8.6* 8.7* 8.6*  --  8.7*  MG  --  1.5*  --  1.5*  --    GFR: Estimated Creatinine Clearance: 88.5 mL/min (by C-G formula based on SCr of 0.91 mg/dL). Liver Function Tests: Recent Labs  Lab 06/04/18 1528 06/05/18 0618  AST 172* 192*  ALT 110* 113*  ALKPHOS 66 68  BILITOT 1.2 1.9*  PROT 7.1 6.8  ALBUMIN 3.7 3.8   Recent Labs  Lab 06/04/18 1528  LIPASE 59*   No results for input(s): AMMONIA in the last 168 hours. Coagulation Profile: Recent Labs  Lab 06/04/18 1519 06/05/18 0618 06/06/18 0413 06/07/18 0410  INR 3.73 2.62 2.26 2.03   Cardiac Enzymes: Recent Labs  Lab 06/04/18 1519 06/04/18 1528 06/05/18 0203 06/05/18 0618 06/05/18 1219  CKTOTAL  --  262  --   --   --   TROPONINI <0.03  --  <0.03 0.03* <0.03   BNP (last 3 results) No results for input(s): PROBNP in the last 8760 hours. HbA1C: No results for input(s): HGBA1C in the last 72 hours. CBG: Recent Labs  Lab 06/06/18 2049 06/06/18 2358 06/07/18 0424 06/07/18 0738 06/07/18 1216  GLUCAP 93 108* 122* 100* 103*   Lipid Profile: No results for input(s): CHOL, HDL, LDLCALC, TRIG, CHOLHDL, LDLDIRECT in the last 72 hours. Thyroid Function Tests: Recent Labs    06/05/18 0203  TSH 1.553   Anemia Panel: No results for input(s): VITAMINB12, FOLATE, FERRITIN, TIBC, IRON, RETICCTPCT in the last 72 hours. Urine analysis:    Component Value Date/Time   COLORURINE AMBER (A) 06/04/2018 1818   APPEARANCEUR CLEAR 06/04/2018 1818   LABSPEC 1.025 06/04/2018 1818   PHURINE 6.0 06/04/2018 1818   GLUCOSEU NEGATIVE 06/04/2018 1818   HGBUR LARGE (A) 06/04/2018 1818   BILIRUBINUR  NEGATIVE 06/04/2018 1818   KETONESUR NEGATIVE 06/04/2018 1818   PROTEINUR 100 (A) 06/04/2018 1818   UROBILINOGEN 1.0 01/25/2013 0825   NITRITE NEGATIVE 06/04/2018 1818   LEUKOCYTESUR NEGATIVE 06/04/2018 1818   Sepsis Labs: @LABRCNTIP (procalcitonin:4,lacticacidven:4)  ) Recent Results (from the past 240 hour(s))  MRSA PCR Screening     Status: None   Collection Time: 06/06/18  1:47 AM  Result Value Ref Range Status   MRSA by PCR NEGATIVE NEGATIVE Final    Comment:        The GeneXpert MRSA Assay (FDA approved for NASAL specimens only), is one component of a comprehensive MRSA colonization surveillance program. It  is not intended to diagnose MRSA infection nor to guide or monitor treatment for MRSA infections. Performed at [facility], [place] [address] [place], [place] [address]          Radiology Studies: No results found.      Scheduled Meds: . chlordiazePOXIDE  25 mg Oral QID   Followed by  . [START ON 06/08/2018] chlordiazePOXIDE  25 mg Oral TID   Followed by  . [START ON 06/09/2018] chlordiazePOXIDE  25 mg Oral BH-qamhs   Followed by  . [START ON 06/10/2018] chlordiazePOXIDE  25 mg Oral Daily  . escitalopram  10 mg Oral Daily  . folic acid  1 mg Oral Daily  . mouth rinse  15 mL Mouth Rinse BID  . metoprolol tartrate  5 mg Intravenous Q8H  . multivitamin with minerals  1 tablet Oral Daily  . thiamine  100 mg Oral Daily   Or  . thiamine  100 mg Intravenous Daily  . warfarin  7.5 mg Oral ONCE-1800  . Warfarin - Pharmacist Dosing Inpatient   Does not apply q1800   Continuous Infusions: . sodium chloride 75 mL/hr at 06/07/18 1509  . dexmedetomidine (PRECEDEX) IV infusion 0.8 mcg/kg/hr (06/07/18 1100)     LOS: 3 days    Time spent: 25 minutes    Edwin Dada, MD [facility] 06/07/2018, 3:19 PM     Pager [phone] --- please page though AMION:  [email or web address] Password [email or web address] If 7PM-7AM, please contact night-coverage

## 2018-06-07 NOTE — Progress Notes (Signed)
PULMONARY / CRITICAL CARE MEDICINE  Name: Leonard Bentley MRN: [id number] DOB: Feb 11, 1961    ADMISSION DATE:  06/04/2018 CONSULTATION DATE:  06/06/18  REFERRING MD :  Jonnie Finner  CHIEF COMPLAINT:  EtOH withdrawal   HISTORY OF PRESENT ILLNESS:   Leonard Bentley is a 57 y.o. male with a PMH as outlined below including but not limited to recurrent PE (on coumadin), MV repair in Dec 2017, EtOH abuse, non-hodgkins lymphoma currently in remision.  He was admitted 7/28 with EtOH withdrawal and exertional dyspnea.  He apparently lost a job 1 month ago and has not been doing well since that time / event.  He was treated with ativan per CIWA; however, early AM 7/30 he became extremely agitated and required transfer to ICU with initiation of precedex. PCCM subsequently consulted.    SUBJECTIVE/INTERVAL 7/30 placed on precedex infusion 7/31 still intermittently agitated and tremulous.  Required PRN Ativan this a.m.  VITAL SIGNS: Temp:  [93.5 F (34.2 C)-98.9 F (37.2 C)] 98.2 F (36.8 C) (07/31 0731) Pulse Rate:  [62-86] 82 (07/31 0800) Resp:  [13-35] 35 (07/31 0800) BP: (103-128)/(82-115) 117/95 (07/31 0800) SpO2:  [89 %-100 %] 98 % (07/31 0800) Weight:  [184 lb 4.9 oz (83.6 kg)] 184 lb 4.9 oz (83.6 kg) (07/31 0500)  Intake/Output Summary (Last 24 hours) at 06/07/2018 0932 Last data filed at 06/07/2018 0849 Gross per 24 hour  Intake 1705.79 ml  Output 1075 ml  Net 630.79 ml    PHYSICAL EXAMINATION: General: This is a 57 year old African-American male is currently sedated on Precedex infusion.  He just received IV Ativan about half an hour prior to my evaluation HEENT normocephalic atraumatic his mucous membranes are dry Pulmonary: Clear to auscultation no accessory use Cardiac: Regular rate and rhythm Abdomen: Soft nontender Extremities: Warm, dry, no edema, brisk cap refill Neuro: Opens eyes to verbal stimulus, speaks in one-word sentences/phrases.  Still confused.   Recent Labs  Lab  06/05/18 0618 06/06/18 0413 06/07/18 0410  NA 138 137 138  K 3.6 4.1 4.3  CL 98 103 105  CO2 26 24 20*  BUN 8 10 10   CREATININE 1.04 0.99 0.91  GLUCOSE 88 129* 105*   Recent Labs  Lab 06/05/18 0618 06/06/18 0413 06/07/18 0730  HGB 12.6* 11.7* 12.3*  HCT 37.7* 34.7* 36.6*  WBC 2.6* 4.1 4.0  PLT 104* 89* 101*   No results found.  STUDIES:  CTA chest 7/28 > neg for PE  SIGNIFICANT EVENTS  7/28 > admit. 7/30 > transfer to ICU and started on precedex.  ASSESSMENT / PLAN:  EtOH withdrawal - initially treated with ativan per CIWA; however, transitioned to precedex early Am 7/30 due to extreme agitation.  Plan: Continue Precedex, will overlap with Librium protocol  Thiamine and folate Supportive care  Hx recurrent PE - on coumadin.  CTA on admit neg for PE. Plan: Continue warfarin  Chronic combined heart failure - echo from April 2018 with 50 - 55%, G1DD. Hx MV repair. Plan: Per cardiology  Fluid and electrolyte imbalance: Hypomagnesemia Plan Recheck following replacement  Hematuria. Plan:  Follow-up with urology as outpatient   DVT prophylaxis: coumadin SUP: NA  Diet: advance as able Activity: BR Disposition : ICU   Erick Colace ACNP-BC [place] Pager # [phone] OR # [phone] if no answer

## 2018-06-08 LAB — CBC
HCT: 37.2 % — ABNORMAL LOW (ref 39.0–52.0)
Hemoglobin: 12.1 g/dL — ABNORMAL LOW (ref 13.0–17.0)
MCH: 25.6 pg — ABNORMAL LOW (ref 26.0–34.0)
MCHC: 32.5 g/dL (ref 30.0–36.0)
MCV: 78.6 fL (ref 78.0–100.0)
Platelets: 100 10*3/uL — ABNORMAL LOW (ref 150–400)
RBC: 4.73 MIL/uL (ref 4.22–5.81)
RDW: 18 % — ABNORMAL HIGH (ref 11.5–15.5)
WBC: 3.4 10*3/uL — ABNORMAL LOW (ref 4.0–10.5)

## 2018-06-08 LAB — GLUCOSE, CAPILLARY
Glucose-Capillary: 109 mg/dL — ABNORMAL HIGH (ref 70–99)
Glucose-Capillary: 83 mg/dL (ref 70–99)
Glucose-Capillary: 89 mg/dL (ref 70–99)
Glucose-Capillary: 94 mg/dL (ref 70–99)
Glucose-Capillary: 95 mg/dL (ref 70–99)
Glucose-Capillary: 97 mg/dL (ref 70–99)

## 2018-06-08 LAB — HEPATIC FUNCTION PANEL
ALT: 63 U/L — ABNORMAL HIGH (ref 0–44)
AST: 72 U/L — ABNORMAL HIGH (ref 15–41)
Albumin: 3 g/dL — ABNORMAL LOW (ref 3.5–5.0)
Alkaline Phosphatase: 58 U/L (ref 38–126)
Bilirubin, Direct: 0.3 mg/dL — ABNORMAL HIGH (ref 0.0–0.2)
Indirect Bilirubin: 0.9 mg/dL (ref 0.3–0.9)
Total Bilirubin: 1.2 mg/dL (ref 0.3–1.2)
Total Protein: 5.8 g/dL — ABNORMAL LOW (ref 6.5–8.1)

## 2018-06-08 LAB — BASIC METABOLIC PANEL
Anion gap: 10 (ref 5–15)
BUN: 11 mg/dL (ref 6–20)
CO2: 24 mmol/L (ref 22–32)
Calcium: 8.7 mg/dL — ABNORMAL LOW (ref 8.9–10.3)
Chloride: 103 mmol/L (ref 98–111)
Creatinine, Ser: 0.87 mg/dL (ref 0.61–1.24)
GFR calc Af Amer: >60 mL/min (ref >60)
GFR calc non Af Amer: >60 mL/min (ref >60)
Glucose, Bld: 93 mg/dL (ref 70–99)
Potassium: 3.6 mmol/L (ref 3.5–5.1)
Sodium: 137 mmol/L (ref 135–145)

## 2018-06-08 LAB — PROTIME-INR
INR: 2.5
Prothrombin Time: 26.8 seconds — ABNORMAL HIGH (ref 11.4–15.2)

## 2018-06-08 MED ORDER — WARFARIN SODIUM 5 MG PO TABS
5.0000 mg | ORAL_TABLET | Freq: Once | ORAL | Status: AC
  Administered 2018-06-08: 5 mg via ORAL
  Filled 2018-06-08: qty 1

## 2018-06-08 NOTE — Plan of Care (Signed)
Pt currently in bed eating dinner. Reports feeling much better today and having less tremors. More alert and answering questions appropriately this shift. Vital signs are stable and no new complaints at this time. Meds given as scheduled. Will continue to monitor.   Problem: Education: Goal: Knowledge of General Education information will improve Description Including pain rating scale, medication(s)/side effects and non-pharmacologic comfort measures Outcome: Progressing   Problem: Health Behavior/Discharge Planning: Goal: Ability to manage health-related needs will improve Outcome: Progressing   Problem: Clinical Measurements: Goal: Ability to maintain clinical measurements within normal limits will improve Outcome: Progressing Goal: Will remain free from infection Outcome: Progressing Goal: Diagnostic test results will improve Outcome: Progressing Goal: Respiratory complications will improve Outcome: Progressing Goal: Cardiovascular complication will be avoided Outcome: Progressing   Problem: Activity: Goal: Risk for activity intolerance will decrease Outcome: Progressing   Problem: Nutrition: Goal: Adequate nutrition will be maintained Outcome: Progressing   Problem: Coping: Goal: Level of anxiety will decrease Outcome: Progressing   Problem: Elimination: Goal: Will not experience complications related to bowel motility Outcome: Progressing Goal: Will not experience complications related to urinary retention Outcome: Progressing   Problem: Pain Managment: Goal: General experience of comfort will improve Outcome: Progressing   Problem: Safety: Goal: Ability to remain free from injury will improve Outcome: Progressing   Problem: Skin Integrity: Goal: Risk for impaired skin integrity will decrease Outcome: Progressing

## 2018-06-08 NOTE — Progress Notes (Signed)
Physical Therapy Treatment Patient Details Name: Leonard Bentley [place] MRN: [id number] DOB: [birth year] Today's Date: 06/08/2018    History of Present Illness 57 yo admitted with ETOH withdrawal and SOB. PT with acute agitation and encephalopathy with withdrawal and precedex drip. PMHx: MVR, CHF, PE, tremors, non Hodgkin lymphoma    PT Comments    Arrived to patient sitting up in bed, appeared pleasant and motivated for therapy. Pt had some residual tremor and generalized weakness with standing and ambulation, required assistance to maintain steady balance. Will benefit from further physical therapy to address balance and strength deficits to continue toward independence, increased functional mobility, and decreased fall risk. Decreased balance and gait since evaluation, discharge plan updated to reflect changes   Follow Up Recommendations  SNF;Supervision for mobility/OOB     Equipment Recommendations       Recommendations for Other Services       Precautions / Restrictions Precautions Precautions: Fall Restrictions Weight Bearing Restrictions: No    Mobility  Bed Mobility Overal bed mobility: Modified Independent                Transfers Overall transfer level: Needs assistance Equipment used: Rolling walker (2 wheeled) Transfers: Sit to/from Stand Sit to Stand: Min assist         General transfer comment: requires assist for balance on standing, cues for hand placement  Ambulation/Gait Ambulation/Gait assistance: Min assist;+2 safety/equipment Gait Distance (Feet): 160 Feet Assistive device: Rolling walker (2 wheeled);IV Pole Gait Pattern/deviations: Step-to pattern;Decreased stride length;Trunk flexed   Gait velocity interpretation: <1.8 ft/sec, indicate of risk for recurrent falls General Gait Details: slight imbalance secondary to tremor, slow and mild apprehension due to feeling shaky. Additional assistance needed during turn/change of direction needed mod cues for  feet placement within walker    Stairs             Wheelchair Mobility    Modified Rankin (Stroke Patients Only)       Balance Overall balance assessment: Needs assistance Sitting-balance support: Single extremity supported Sitting balance-Leahy Scale: Fair     Standing balance support: Bilateral upper extremity supported Standing balance-Leahy Scale: Poor                              Cognition Arousal/Alertness: Awake/alert Behavior During Therapy: WFL for tasks assessed/performed Overall Cognitive Status: Impaired/Different from baseline Area of Impairment: Orientation                 Orientation Level: Place;Time                    Exercises      General Comments        Pertinent Vitals/Pain Pain Assessment: No/denies pain    Home Living                      Prior Function            PT Goals (current goals can now be found in the care plan section) Progress towards PT goals: Progressing toward goals    Frequency    Min 3X/week      PT Plan Discharge plan needs to be updated    Co-evaluation              AM-PAC PT "6 Clicks" Daily Activity  Outcome Measure  Difficulty turning over in bed (including adjusting bedclothes, sheets and blankets)?: A Little Difficulty moving from  lying on back to sitting on the side of the bed? : A Little Difficulty sitting down on and standing up from a chair with arms (e.g., wheelchair, bedside commode, etc,.)?: Unable Help needed moving to and from a bed to chair (including a wheelchair)?: A Little Help needed walking in hospital room?: A Little Help needed climbing 3-5 steps with a railing? : A Lot 6 Click Score: 15    End of Session Equipment Utilized During Treatment: Gait belt Activity Tolerance: Patient tolerated treatment well;Patient limited by fatigue Patient left: in chair;with call bell/phone within reach;with nursing/sitter in room Nurse  Communication: Mobility status PT Visit Diagnosis: Other abnormalities of gait and mobility (R26.89);Unsteadiness on feet (R26.81)     Time: 0071-2197 PT Time Calculation (min) (ACUTE ONLY): 26 min  Charges:  $Gait Training: 8-22 mins $Therapeutic Activity: 8-22 mins                        Samuella Bruin, SPT 06/08/2018, 12:15 PM  Acute Rehab [phone]

## 2018-06-08 NOTE — Clinical Social Work Note (Signed)
Clinical Social Work Assessment  Patient Details  Name: Leonard Bentley MRN: [id number] Date of Birth: 02-13-1961  Date of referral:  06/08/18               Reason for consult:  Facility Placement, Substance Use/ETOH Abuse                Permission sought to share information with:  Chartered certified accountant granted to share information::  Yes, Verbal Permission Granted  Name::        Agency::  [facility]  Relationship::     Contact Information:     Housing/Transportation Living arrangements for the past 2 months:  Single Family Home Source of Information:  Patient Patient Interpreter Needed:  None Criminal Activity/Legal Involvement Pertinent to Current Situation/Hospitalization:  No - Comment as needed Significant Relationships:  Siblings Lives with:  Self Do you feel safe going back to the place where you live?  Yes Need for family participation in patient care:  No (Coment)  Care giving concerns:  Pt lives at home alone- currently somewhat unsteady with mobility.  Also concerns of ETOH abuse.   Social Worker assessment / plan:  CSW met with pt to discuss above.  First discussed how pt did with PT today.  Pt feels as if he did well and thinks he could manage at home in a few more days.  CSW informed pt of recommendation for SNF but barriers to placement due to lack of insurance- pt expressed understanding and states he feels as if he can return home safely.  CSW then spoke with pt about concerns with ETOH abuse.  Pt reports that he has been drinking regularly for past 6 years- states he drinks 1 bottle of wine every other Camps.  Pt states he has attempted to quit before and went to meetings but started back after a month or so.   CSW provided with a list of outpatient and inpatient options and discussed ones that accept pts without insurance.  Employment status:  Unemployed Forensic scientist:  Self Pay (Medicaid Pending) PT Recommendations:  [facility] / Referral to community resources:  Residential Substance Abuse Treatment Options  Patient/Family's Response to care:  At this time pt is agreeable to rehab for his alcohol use- states that this current episode has scared him and he understands the importance of stopping.  Pt is agreeable to inpatient rehab referral at [facility] made appt for next available (august 22nd).  CSW also informed pt that other local inpatient options, [facility], would require him to do phone assessment and then set up in person assessment.  Patient/Family's Understanding of and Emotional Response to Diagnosis, Current Treatment, and Prognosis:  Pt seems to have good understanding of his condition and need to make lifestyle changes- hopeful that he can go to rehab and be successful in quitting alcohol.  Emotional Assessment Appearance:  Appears stated age Attitude/Demeanor/Rapport:    Affect (typically observed):  Appropriate, Pleasant, Accepting Orientation:  Oriented to Self, Oriented to Place, Oriented to  Time, Oriented to Situation Alcohol / Substance use:  Alcohol Use Psych involvement (Current and /or in the community):  No (Comment)  Discharge Needs  Concerns to be addressed:  Substance Abuse Concerns Readmission within the last 30 days:  No Current discharge risk:  Substance Abuse Barriers to Discharge:  Continued Medical Work up   [employer], LCSW 06/08/2018, 3:10 PM

## 2018-06-08 NOTE — Progress Notes (Signed)
PROGRESS NOTE    Leonard Bentley  WUJ:[id number] DOB: 07-Jul-1961 DOA: 06/04/2018 PCP: [name], MD      Brief Narrative:  Leonard Bentley is a 57 y.o. M with hx alcoholism, PE on warfarin, chronic systolic and diastolic CHF and MVR 9562 who presents with 3 weeks progressive dyspnea on exertion, found to be withdrawing heavily from alcohol.  Overnight 7/29, patient developed delirium, required initiation of Precedex and ICU transfer.     Assessment & Plan:  Delirium tremens Alcohol withdrawal Precedex stopped yesterday afternoon, patient no delirium overnight. -Continue Librium taper -Consult SW re: alcohol  Acute on chronic systolic and diastolic CHF EF 50 to 13% in April 2018.  Cr stable, appears euvolemic.   -Strict I/Os, daily weights  Transaminitis Tylenol undetectable, hepatitis panel negative, HIV negative.  Presumably from alcohol. -Trend LFTs  Hypertension -Stop IV metoprolol  History of recurrent VTE -Continue warfarin -Daily INR  Smoking -Smoking cessation recommended, modalities discussed  Remote history of lymphoma CT angiogram shows unchanged hilar and mediastinal lymphadenopathy and pulmonary nodules.  Pancytopenia Suspect this is from alcoholism.  No signs of blood loss.  Hemoglobin stable.   DVT prophylaxis: SCDs Code Status: FULL Family Communication: None present MDM and disposition Plan: The below labs and imaging reports were reviewed and summarized above.    The patient was admitted with dyspnea, found to have EtOH withdrawal that progressed to delirium, transferred to ICU on Precedex.    Now precedex weaned, PT eval today, dispo pending PT eval. If strong enough, home.  Otherwise to SNF.        Consultants:   CCM  Cardiology  Procedures:   None  Antimicrobials:   None    Subjective: No fever overnight. No cough, sputum.  No dyspnea. Still dizzy, unsteady, but no chest symptoms.  More oriented today.  Objective: Vitals:     06/08/18 0700 06/08/18 0800 06/08/18 0945 06/08/18 1000  BP: (!) 117/104 105/78 (!) 151/100 (!) 167/113  Pulse: 73 (!) 57    Resp: (!) 24 (!) 22 (!) 28 (!) 32  Temp:  98.4 F (36.9 C)    TempSrc:  Oral    SpO2: 100% 100%    Weight:      Height:        Intake/Output Summary (Last 24 hours) at 06/08/2018 1017 Last data filed at 06/08/2018 1000 Gross per 24 hour  Intake 2094.56 ml  Output 850 ml  Net 1244.56 ml   Filed Weights   06/05/18 0616 06/07/18 0500 06/08/18 0500  Weight: 85.2 kg (187 lb 12.8 oz) 83.6 kg (184 lb 4.9 oz) 88.9 kg (195 lb 15.8 oz)    Examination: General appearance: Adult male, sitting up, interactive, no acute distress.   HEENT: Anicteric, conjunctiva normal, lids and lashes normal, Lips moist, teeth normal, no oral lesions, OP moist, hearing normal.    Skin: Warm and dry, no suspicious rashes. Cardiac: RRR, no murmurs, no LE edema.   JVP normal. Respiratory: Respirations normal, no rales or wheezes. Abdomen: Abdomen soft without TTP, gaurding.  No HSM. MSK: No deformities or effusions. Neuro: Awake, alert.  Oriented to person, place time.  Strength 5/5 in upper and lower extremities, appears a little discoordinated.    Psych: Responding to questions, slightly sedated.  No hallucinations.  Attention normal.    Data Reviewed: I have personally reviewed following labs and imaging studies:  CBC: Recent Labs  Lab 06/04/18 1519 06/05/18 0618 06/06/18 0413 06/07/18 0730 06/08/18 0525  WBC  3.3* 2.6* 4.1 4.0 3.4*  HGB 12.4* 12.6* 11.7* 12.3* 12.1*  HCT 36.6* 37.7* 34.7* 36.6* 37.2*  MCV 76.9* 77.4* 77.3* 77.9* 78.6  PLT 111* 104* 89* 101* 235*   Basic Metabolic Panel: Recent Labs  Lab 06/04/18 1519 06/05/18 0618 06/06/18 0413 06/06/18 0613 06/07/18 0410 06/08/18 0525  NA 139 138 137  --  138 137  K 4.1 3.6 4.1  --  4.3 3.6  CL 102 98 103  --  105 103  CO2 22 26 24   --  20* 24  GLUCOSE 91 88 129*  --  105* 93  BUN 9 8 10   --  10 11   CREATININE 0.89 1.04 0.99  --  0.91 0.87  CALCIUM 8.6* 8.7* 8.6*  --  8.7* 8.7*  MG  --  1.5*  --  1.5* 1.7  --   PHOS  --   --   --   --  2.9  --    GFR: Estimated Creatinine Clearance: 102.1 mL/min (by C-G formula based on SCr of 0.87 mg/dL). Liver Function Tests: Recent Labs  Lab 06/04/18 1528 06/05/18 0618  AST 172* 192*  ALT 110* 113*  ALKPHOS 66 68  BILITOT 1.2 1.9*  PROT 7.1 6.8  ALBUMIN 3.7 3.8   Recent Labs  Lab 06/04/18 1528  LIPASE 59*   Recent Labs  Lab 06/07/18 1643  AMMONIA 26   Coagulation Profile: Recent Labs  Lab 06/04/18 1519 06/05/18 0618 06/06/18 0413 06/07/18 0410 06/08/18 0525  INR 3.73 2.62 2.26 2.03 2.50   Cardiac Enzymes: Recent Labs  Lab 06/04/18 1519 06/04/18 1528 06/05/18 0203 06/05/18 0618 06/05/18 1219  CKTOTAL  --  262  --   --   --   TROPONINI <0.03  --  <0.03 0.03* <0.03   BNP (last 3 results) No results for input(s): PROBNP in the last 8760 hours. HbA1C: No results for input(s): HGBA1C in the last 72 hours. CBG: Recent Labs  Lab 06/07/18 1556 06/07/18 1959 06/07/18 2355 06/08/18 0402 06/08/18 0740  GLUCAP 96 109* 109* 97 83   Lipid Profile: No results for input(s): CHOL, HDL, LDLCALC, TRIG, CHOLHDL, LDLDIRECT in the last 72 hours. Thyroid Function Tests: No results for input(s): TSH, T4TOTAL, FREET4, T3FREE, THYROIDAB in the last 72 hours. Anemia Panel: No results for input(s): VITAMINB12, FOLATE, FERRITIN, TIBC, IRON, RETICCTPCT in the last 72 hours. Urine analysis:    Component Value Date/Time   COLORURINE AMBER (A) 06/04/2018 1818   APPEARANCEUR CLEAR 06/04/2018 1818   LABSPEC 1.025 06/04/2018 1818   PHURINE 6.0 06/04/2018 1818   GLUCOSEU NEGATIVE 06/04/2018 1818   HGBUR LARGE (A) 06/04/2018 1818   BILIRUBINUR NEGATIVE 06/04/2018 1818   KETONESUR NEGATIVE 06/04/2018 1818   PROTEINUR 100 (A) 06/04/2018 1818   UROBILINOGEN 1.0 01/25/2013 0825   NITRITE NEGATIVE 06/04/2018 1818   LEUKOCYTESUR  NEGATIVE 06/04/2018 1818   Sepsis Labs: @LABRCNTIP (procalcitonin:4,lacticacidven:4)  ) Recent Results (from the past 240 hour(s))  MRSA PCR Screening     Status: None   Collection Time: 06/06/18  1:47 AM  Result Value Ref Range Status   MRSA by PCR NEGATIVE NEGATIVE Final    Comment:        The GeneXpert MRSA Assay (FDA approved for NASAL specimens only), is one component of a comprehensive MRSA colonization surveillance program. It is not intended to diagnose MRSA infection nor to guide or monitor treatment for MRSA infections. Performed at [facility], [place] [address], [place], [place]  [place]          Radiology Studies: No results found.      Scheduled Meds: . chlordiazePOXIDE  25 mg Oral TID   Followed by  . [START ON 06/09/2018] chlordiazePOXIDE  25 mg Oral BH-qamhs   Followed by  . [START ON 06/10/2018] chlordiazePOXIDE  25 mg Oral Daily  . escitalopram  10 mg Oral Daily  . folic acid  1 mg Oral Daily  . mouth rinse  15 mL Mouth Rinse BID  . metoprolol tartrate  5 mg Intravenous Q8H  . multivitamin with minerals  1 tablet Oral Daily  . thiamine  100 mg Oral Daily   Or  . thiamine  100 mg Intravenous Daily  . Warfarin - Pharmacist Dosing Inpatient   Does not apply q1800   Continuous Infusions: . sodium chloride 75 mL/hr at 06/08/18 1000  . dexmedetomidine (PRECEDEX) IV infusion Stopped (06/07/18 1600)     LOS: 4 days    Time spent: 25 minmutes   Edwin Dada, MD [facility] 06/08/2018, 10:17 AM     Pager [phone] --- please page though AMION:  [email or web address] Password [email or web address] If 7PM-7AM, please contact night-coverage

## 2018-06-08 NOTE — Progress Notes (Signed)
CSW met with pt to discuss rehab and substance abuse resources.  Pt walking 89f with PT and has no insurance- not eligible for LOG SNF placement.  PT unable to afford private pay SNF.  Pt reports feelings safe returning home after a few more days to recover.  CSW provided pt with substance abuse resources and [facility] added to AVS  See full assessment for more details  CSW signing off  JJorge Ny LSobieskiSocial Worker [phone]

## 2018-06-08 NOTE — Progress Notes (Signed)
[facility] for Coumadin Indication: h/o PE and mitral valve repair  No Known Allergies  Patient Measurements: Height: 5\' 9"  (175.3 cm) Weight: 195 lb 15.8 oz (88.9 kg) IBW/kg (Calculated) : 70.7  Vital Signs: Temp: 98.4 F (36.9 C) (08/01 0800) Temp Source: Oral (08/01 0800) BP: 167/113 (08/01 1000) Pulse Rate: 57 (08/01 0800)  Labs: Recent Labs    06/05/18 1219  06/06/18 0413 06/07/18 0410 06/07/18 0730 06/08/18 0525  HGB  --    < > 11.7*  --  12.3* 12.1*  HCT  --   --  34.7*  --  36.6* 37.2*  PLT  --   --  89*  --  101* 100*  LABPROT  --   --  24.8* 22.8*  --  26.8*  INR  --   --  2.26 2.03  --  2.50  CREATININE  --   --  0.99 0.91  --  0.87  TROPONINI <0.03  --   --   --   --   --    < > = values in this interval not displayed.   Assessment: 57yo male c/o tremors, dizziness, and nausea, admitted for further w/u, to continue Coumadin for h/o PE and mitral valve repair. On warfarin 5 mg daily PTA.   Admitting INR above goal (3.73) decreased to 2.0 yesterday. Dose was held 7/28. INR therapeutic at 2.5 s/p 5 mg then 7.5 mg x2. H/H stable. No bleeding issues noted.   Goal of Therapy:  INR 2-3 per outpt [facility] clinic notes   Plan: Will give Warfarin 5mg  tonight  F/U AM labs and adjust as needed.  Claiborne Billings, PharmD PGY2 Cardiology Pharmacy Resident Phone [phone] 06/08/2018 11:21 AM

## 2018-06-08 NOTE — Progress Notes (Signed)
[facility] will sign off.   Medication Recommendations:  Continue warfarin Other recommendations (labs, testing, etc):  n/a Follow up as an outpatient:  As scheduled   Eliazer Hemphill C. Oval Linsey, MD, [facility] 06/08/2018 8:32 AM

## 2018-06-08 NOTE — Progress Notes (Signed)
Off from Precedex.  Calm and cooperative.  CCM available as needed  Erick Colace ACNP-BC [place] Pager # [phone] OR # [phone] if no answer

## 2018-06-08 NOTE — Plan of Care (Signed)
  Problem: Education: ?Goal: Knowledge of General Education information will improve ?Description: Including pain rating scale, medication(s)/side effects and non-pharmacologic comfort measures ?Outcome: Progressing ?  ?Problem: Health Behavior/Discharge Planning: ?Goal: Ability to manage health-related needs will improve ?Outcome: Progressing ?  ?Problem: Clinical Measurements: ?Goal: Ability to maintain clinical measurements within normal limits will improve ?Outcome: Progressing ?  ?Problem: Activity: ?Goal: Risk for activity intolerance will decrease ?Outcome: Progressing ?  ?Problem: Coping: ?Goal: Level of anxiety will decrease ?Outcome: Progressing ?  ?Problem: Pain Managment: ?Goal: General experience of comfort will improve ?Outcome: Progressing ?  ?

## 2018-06-09 LAB — GLUCOSE, CAPILLARY
Glucose-Capillary: 110 mg/dL — ABNORMAL HIGH (ref 70–99)
Glucose-Capillary: 110 mg/dL — ABNORMAL HIGH (ref 70–99)
Glucose-Capillary: 87 mg/dL (ref 70–99)
Glucose-Capillary: 92 mg/dL (ref 70–99)
Glucose-Capillary: 95 mg/dL (ref 70–99)
Glucose-Capillary: 95 mg/dL (ref 70–99)

## 2018-06-09 LAB — BASIC METABOLIC PANEL
Anion gap: 11 (ref 5–15)
BUN: 12 mg/dL (ref 6–20)
CO2: 22 mmol/L (ref 22–32)
Calcium: 8.6 mg/dL — ABNORMAL LOW (ref 8.9–10.3)
Chloride: 102 mmol/L (ref 98–111)
Creatinine, Ser: 0.88 mg/dL (ref 0.61–1.24)
GFR calc Af Amer: >60 mL/min (ref >60)
GFR calc non Af Amer: >60 mL/min (ref >60)
Glucose, Bld: 88 mg/dL (ref 70–99)
Potassium: 3.6 mmol/L (ref 3.5–5.1)
Sodium: 135 mmol/L (ref 135–145)

## 2018-06-09 LAB — CBC
HCT: 35.1 % — ABNORMAL LOW (ref 39.0–52.0)
Hemoglobin: 11.9 g/dL — ABNORMAL LOW (ref 13.0–17.0)
MCH: 26.1 pg (ref 26.0–34.0)
MCHC: 33.9 g/dL (ref 30.0–36.0)
MCV: 77 fL — ABNORMAL LOW (ref 78.0–100.0)
Platelets: 113 10*3/uL — ABNORMAL LOW (ref 150–400)
RBC: 4.56 MIL/uL (ref 4.22–5.81)
RDW: 17.5 % — ABNORMAL HIGH (ref 11.5–15.5)
WBC: 3.6 10*3/uL — ABNORMAL LOW (ref 4.0–10.5)

## 2018-06-09 LAB — PROTIME-INR
INR: 2.46
Prothrombin Time: 26.5 seconds — ABNORMAL HIGH (ref 11.4–15.2)

## 2018-06-09 MED ORDER — POLYVINYL ALCOHOL 1.4 % OP SOLN: 1.0000 [drp] | OPHTHALMIC | Status: DC | PRN

## 2018-06-09 MED ORDER — HYPROMELLOSE (GONIOSCOPIC) 2.5 % OP SOLN
1.0000 [drp] | OPHTHALMIC | Status: DC | PRN
  Filled 2018-06-09: qty 15

## 2018-06-09 MED ORDER — METOPROLOL TARTRATE 25 MG PO TABS
25.0000 mg | ORAL_TABLET | Freq: Two times a day (BID) | ORAL | Status: DC
  Administered 2018-06-09 – 2018-06-12 (×6): 25 mg via ORAL
  Filled 2018-06-09 (×7): qty 1

## 2018-06-09 MED ORDER — WARFARIN SODIUM 5 MG PO TABS
5.0000 mg | ORAL_TABLET | Freq: Once | ORAL | Status: AC
  Administered 2018-06-09: 5 mg via ORAL
  Filled 2018-06-09: qty 1

## 2018-06-09 NOTE — Progress Notes (Signed)
PROGRESS NOTE    Leonard Bentley  YIR:[id number] DOB: 09-Feb-1961 DOA: 06/04/2018 PCP: Leonard Lung, MD      Brief Narrative:  Leonard Bentley is a 57 y.o. M with hx alcoholism, PE on warfarin, chronic systolic and diastolic CHF and MVR 5009 who presents with 3 weeks progressive dyspnea on exertion, found to be withdrawing heavily from alcohol.  Overnight 7/29, patient developed delirium, required initiation of Precedex and ICU transfer.     Assessment & Plan:  Delirium tremens Alcohol withdrawal Precedex stopped two days ago, patient improving.   -Finish Librium taper -Consult SW re: alcohol -Alcohol cessation complete recommended and dsicussed   Acute on chronic systolic and diastolic CHF EF 50 to 38% in April 2018.  Cr stable, appears euvolemic.   -Strict I/Os, daily weights  Alcoholic hepatitis Tylenol undetectable, hepatitis panel negative, HIV negative.  Presumably from alcohol.  Improved considerably by yesterday. -Repeat LFTs as an outpatient  Hypertension -Restart metoprolol  Atrial fibrillation, paroxysmal Mitral valve replacement, Edwards Physio II ring This was noted on tele overnight.  He has a history of atrial tachycardia before, although I don't see this being called "AFib" by his Cardiologist.  Patient reports he has been diagnosed with 'afib' in the past.  CHA2DS2-Vasc at least 2.  On AC anyway for recurrent VTE. -Continue warfarin  History of recurrent VTE -Continue warfarin -Daily INR  Smoking -Smoking cessation recommended, modalities discussed  Remote history of lymphoma CT angiogram shows unchanged hilar and mediastinal lymphadenopathy and pulmonary nodules.  Alcoholic pancytopenia Suspect this is from alcoholism.  No signs of blood loss.  Blood counts stable.      DVT prophylaxis: SCDs Code Status: FULL Family Communication: None present MDM and disposition Plan: The below labs and imaging reports were reviewed and summarized above.  The  patient was admitted with dyspnea, found to have alcohol withdrawal progressed to delirium requiring ICU admission and Precedex.  He is now been weaned off of Precedex and is improving, but is significantly debilitated from baseline (he runs 2 miles several times a week at baseline, at present he can barely get out of bed or brush his teeth).  He will continue work with nursing and physical therapy on strengthening, then home with home health.     Consultants:   CCM  Cardiology  Procedures:   None  Antimicrobials:   None    Subjective: No new fever, cough, sputum, dyspnea, dizziness.  No confusion, disorientation.  No chest pain, palpitations.  When he walks he is unsteady, but not terribly dyspneic, or with severe palpitatations (nursing note his HR jumpts to 150s with exertion, but he is asymptomatic, monitor reads irregular narrow complex tachcyardia).        Objective: Vitals:   06/09/18 1200 06/09/18 1300 06/09/18 1400 06/09/18 1500  BP: (!) 133/102 (!) 142/88 113/86 (!) 117/98  Pulse: 86 89 86 85  Resp: (!) 25 (!) 23 20 (!) 23  Temp: 98.1 F (36.7 C)   98.5 F (36.9 C)  TempSrc: Oral   Oral  SpO2: 100% 100% 99% 99%  Weight:      Height:        Intake/Output Summary (Last 24 hours) at 06/09/2018 1641 Last data filed at 06/09/2018 1425 Gross per 24 hour  Intake 1360.58 ml  Output -  Net 1360.58 ml   Filed Weights   06/07/18 0500 06/08/18 0500 06/09/18 0600  Weight: 83.6 kg (184 lb 4.9 oz) 88.9 kg (195 lb 15.8 oz) 89  kg (196 lb 3.4 oz)    Examination: General appearance: Thin adult male, sitting up in bed, interactive, no acute distress, psychomotor slowing noted. HEENT: Anicteric, conjunctive are normal, lids and lashes normal.  Lips moist, teeth normal.  No oral lesions, oropharynx moist, hearing normal.    Skin: Skin warm and dry, no suspicious rashes or lesions.  No spider telangiectasias, no stigmata of chronic liver disease. Cardiac: Regular rate and  rhythm, no murmurs, no lower extremity edema.  JVP normal. Respiratory: Respiratory rate is normal, lungs clear to auscultation without rales or wheezes. Abdomen: Abdomen soft without tenderness to palpation or guarding.  No hepatospleno megaly. MSK: No deformities or effusions. Neuro: Awake, responsive to questions, oriented to person, place, and time.  Strength 5/5 in the upper and lower extremities bilaterally.  Still appears very tremulous and discoordinated.   Gait unsteady, shuffling and tremulous. Psych: Sensorium intact and responding to questions, but marked psychomotor slowing, no hallucinations, attention normal.    Data Reviewed: I have personally reviewed following labs and imaging studies:  CBC: Recent Labs  Lab 06/05/18 0618 06/06/18 0413 06/07/18 0730 06/08/18 0525 06/09/18 0306  WBC 2.6* 4.1 4.0 3.4* 3.6*  HGB 12.6* 11.7* 12.3* 12.1* 11.9*  HCT 37.7* 34.7* 36.6* 37.2* 35.1*  MCV 77.4* 77.3* 77.9* 78.6 77.0*  PLT 104* 89* 101* 100* 412*   Basic Metabolic Panel: Recent Labs  Lab 06/05/18 0618 06/06/18 0413 06/06/18 0613 06/07/18 0410 06/08/18 0525 06/09/18 0306  NA 138 137  --  138 137 135  K 3.6 4.1  --  4.3 3.6 3.6  CL 98 103  --  105 103 102  CO2 26 24  --  20* 24 22  GLUCOSE 88 129*  --  105* 93 88  BUN 8 10  --  10 11 12   CREATININE 1.04 0.99  --  0.91 0.87 0.88  CALCIUM 8.7* 8.6*  --  8.7* 8.7* 8.6*  MG 1.5*  --  1.5* 1.7  --   --   PHOS  --   --   --  2.9  --   --    GFR: Estimated Creatinine Clearance: 100.9 mL/min (by C-G formula based on SCr of 0.88 mg/dL). Liver Function Tests: Recent Labs  Lab 06/04/18 1528 06/05/18 0618 06/08/18 0525  AST 172* 192* 72*  ALT 110* 113* 63*  ALKPHOS 66 68 58  BILITOT 1.2 1.9* 1.2  PROT 7.1 6.8 5.8*  ALBUMIN 3.7 3.8 3.0*   Recent Labs  Lab 06/04/18 1528  LIPASE 59*   Recent Labs  Lab 06/07/18 1643  AMMONIA 26   Coagulation Profile: Recent Labs  Lab 06/05/18 0618 06/06/18 0413  06/07/18 0410 06/08/18 0525 06/09/18 0306  INR 2.62 2.26 2.03 2.50 2.46   Cardiac Enzymes: Recent Labs  Lab 06/04/18 1519 06/04/18 1528 06/05/18 0203 06/05/18 0618 06/05/18 1219  CKTOTAL  --  262  --   --   --   TROPONINI <0.03  --  <0.03 0.03* <0.03   BNP (last 3 results) No results for input(s): PROBNP in the last 8760 hours. HbA1C: No results for input(s): HGBA1C in the last 72 hours. CBG: Recent Labs  Lab 06/09/18 0023 06/09/18 0315 06/09/18 0727 06/09/18 1258 06/09/18 1559  GLUCAP 92 87 95 95 110*   Lipid Profile: No results for input(s): CHOL, HDL, LDLCALC, TRIG, CHOLHDL, LDLDIRECT in the last 72 hours. Thyroid Function Tests: No results for input(s): TSH, T4TOTAL, FREET4, T3FREE, THYROIDAB in the last 72 hours. Anemia  Panel: No results for input(s): VITAMINB12, FOLATE, FERRITIN, TIBC, IRON, RETICCTPCT in the last 72 hours. Urine analysis:    Component Value Date/Time   COLORURINE AMBER (A) 06/04/2018 1818   APPEARANCEUR CLEAR 06/04/2018 1818   LABSPEC 1.025 06/04/2018 1818   PHURINE 6.0 06/04/2018 1818   GLUCOSEU NEGATIVE 06/04/2018 1818   HGBUR LARGE (A) 06/04/2018 1818   BILIRUBINUR NEGATIVE 06/04/2018 1818   KETONESUR NEGATIVE 06/04/2018 1818   PROTEINUR 100 (A) 06/04/2018 1818   UROBILINOGEN 1.0 01/25/2013 0825   NITRITE NEGATIVE 06/04/2018 1818   LEUKOCYTESUR NEGATIVE 06/04/2018 1818   Sepsis Labs: @LABRCNTIP (procalcitonin:4,lacticacidven:4)  ) Recent Results (from the past 240 hour(s))  MRSA PCR Screening     Status: None   Collection Time: 06/06/18  1:47 AM  Result Value Ref Range Status   MRSA by PCR NEGATIVE NEGATIVE Final    Comment:        The GeneXpert MRSA Assay (FDA approved for NASAL specimens only), is one component of a comprehensive MRSA colonization surveillance program. It is not intended to diagnose MRSA infection nor to guide or monitor treatment for MRSA infections. Performed at [facility], [place] [address] [place], [place] [address]          Radiology Studies: No results found.      Scheduled Meds: . chlordiazePOXIDE  25 mg Oral BH-qamhs   Followed by  . [START ON 06/10/2018] chlordiazePOXIDE  25 mg Oral Daily  . escitalopram  10 mg Oral Daily  . folic acid  1 mg Oral Daily  . mouth rinse  15 mL Mouth Rinse BID  . metoprolol tartrate  25 mg Oral BID  . multivitamin with minerals  1 tablet Oral Daily  . thiamine  100 mg Oral Daily   Or  . thiamine  100 mg Intravenous Daily  . warfarin  5 mg Oral ONCE-1800  . Warfarin - Pharmacist Dosing Inpatient   Does not apply q1800   Continuous Infusions: . dexmedetomidine (PRECEDEX) IV infusion Stopped (06/07/18 1600)     LOS: 5 days    Time spent: 25 minutes   Edwin Dada, MD [facility] 06/09/2018, 4:41 PM     Pager [phone] --- please page though AMION:  [email or web address] Password [email or web address] If 7PM-7AM, please contact night-coverage

## 2018-06-09 NOTE — Progress Notes (Signed)
[facility] for Coumadin Indication: h/o PE and mitral valve repair  No Known Allergies  Patient Measurements: Height: 5\' 9"  (175.3 cm) Weight: 196 lb 3.4 oz (89 kg) IBW/kg (Calculated) : 70.7  Vital Signs: Temp: 97.9 F (36.6 C) (08/02 0700) Temp Source: Oral (08/02 0700) BP: 133/102 (08/02 1200) Pulse Rate: 86 (08/02 1200)  Labs: Recent Labs    06/07/18 0410  06/07/18 0730 06/08/18 0525 06/09/18 0306  HGB  --    < > 12.3* 12.1* 11.9*  HCT  --   --  36.6* 37.2* 35.1*  PLT  --   --  101* 100* 113*  LABPROT 22.8*  --   --  26.8* 26.5*  INR 2.03  --   --  2.50 2.46  CREATININE 0.91  --   --  0.87 0.88   < > = values in this interval not displayed.   Assessment: 57yo male c/o tremors, dizziness, and nausea, admitted for further w/u, to continue Coumadin for h/o PE and mitral valve repair. On warfarin 5 mg daily PTA.   Admitting INR above goal (3.73) decreased to 2.0 yesterday. Dose was held 7/28. INR therapeutic at 2.46 s/p 5 mg then 7.5 mg x2 then 5 mg x1. H/H stable. No bleeding issues noted.   Goal of Therapy:  INR 2-3 per outpt [facility] clinic notes   Plan: Will give Warfarin 5mg  tonight  F/U AM labs and adjust as needed.  Claiborne Billings, PharmD PGY2 Cardiology Pharmacy Resident Phone [phone] 06/09/2018 12:50 PM

## 2018-06-09 NOTE — Progress Notes (Signed)
Patient trasfered from [facility] to [phone] via wheelchair; alert and oriented x 4; no complaints of pain; IV saline locked in RFA; skin intact. Orient patient to room and unit; instructed how to use the call bell and  fall risk precautions. Will continue to monitor the patient.

## 2018-06-10 LAB — CBC
HCT: 33.4 % — ABNORMAL LOW (ref 39.0–52.0)
Hemoglobin: 11.1 g/dL — ABNORMAL LOW (ref 13.0–17.0)
MCH: 25.9 pg — ABNORMAL LOW (ref 26.0–34.0)
MCHC: 33.2 g/dL (ref 30.0–36.0)
MCV: 77.9 fL — ABNORMAL LOW (ref 78.0–100.0)
Platelets: 137 10*3/uL — ABNORMAL LOW (ref 150–400)
RBC: 4.29 MIL/uL (ref 4.22–5.81)
RDW: 17.7 % — ABNORMAL HIGH (ref 11.5–15.5)
WBC: 3.6 10*3/uL — ABNORMAL LOW (ref 4.0–10.5)

## 2018-06-10 LAB — BASIC METABOLIC PANEL
Anion gap: 8 (ref 5–15)
BUN: 8 mg/dL (ref 6–20)
CO2: 25 mmol/L (ref 22–32)
Calcium: 8.6 mg/dL — ABNORMAL LOW (ref 8.9–10.3)
Chloride: 105 mmol/L (ref 98–111)
Creatinine, Ser: 0.84 mg/dL (ref 0.61–1.24)
GFR calc Af Amer: >60 mL/min (ref >60)
GFR calc non Af Amer: >60 mL/min (ref >60)
Glucose, Bld: 109 mg/dL — ABNORMAL HIGH (ref 70–99)
Potassium: 3.5 mmol/L (ref 3.5–5.1)
Sodium: 138 mmol/L (ref 135–145)

## 2018-06-10 LAB — PROTIME-INR
INR: 2.47
Prothrombin Time: 26.5 seconds — ABNORMAL HIGH (ref 11.4–15.2)

## 2018-06-10 LAB — GLUCOSE, CAPILLARY
Glucose-Capillary: 96 mg/dL (ref 70–99)
Glucose-Capillary: 142 mg/dL — ABNORMAL HIGH (ref 70–99)

## 2018-06-10 MED ORDER — CHLORDIAZEPOXIDE HCL 25 MG PO CAPS: 25.0000 mg | ORAL_CAPSULE | Freq: Once | ORAL | Status: DC

## 2018-06-10 MED ORDER — WARFARIN SODIUM 5 MG PO TABS
5.0000 mg | ORAL_TABLET | Freq: Once | ORAL | Status: AC
  Administered 2018-06-10: 5 mg via ORAL
  Filled 2018-06-10: qty 1

## 2018-06-10 MED ORDER — GABAPENTIN 300 MG PO CAPS
300.0000 mg | ORAL_CAPSULE | Freq: Three times a day (TID) | ORAL | Status: DC
  Administered 2018-06-10 – 2018-06-11 (×4): 300 mg via ORAL
  Filled 2018-06-10 (×4): qty 1

## 2018-06-10 NOTE — Progress Notes (Signed)
PROGRESS NOTE    Ronav Furney Sailors  HMC:[id number] DOB: 09/02/1961 DOA: 06/04/2018 PCP: [name], MD      Brief Narrative:  Mr. Panas is a 57 y.o. M with hx alcoholism, PE on warfarin, chronic systolic and diastolic CHF and MVR 6629 who presents with 3 weeks progressive dyspnea on exertion, found to be withdrawing heavily from alcohol.  Overnight 7/29, patient developed delirium, required initiation of Precedex and ICU transfer.     Assessment & Plan:  Delirium tremens Alcohol withdrawal Precedex stopped two days ago, patient improving.   -Complete Librium taper tomorrow -Start gabapentin 3 times daily -Consult SW re: alcohol -Alcohol cessation complete recommended and dsicussed   Acute on chronic systolic and diastolic CHF EF 50 to 47% in April 2018.  Cr stable, appears euvolemic.   -Strict I/Os, daily weights  Alcoholic hepatitis Tylenol undetectable, hepatitis panel negative, HIV negative.  Presumably from alcohol.  LFTs were improving -Complete alcohol abstinence -Repeat LFTs as an outpatient  Hypertension -Continue metoprolol  Atrial fibrillation, paroxysmal Mitral valve replacement, Edwards Physio II ring This was noted on tele overnight.  He has a history of atrial tachycardia before, although I don't see this being called "AFib" by his Cardiologist.  Patient reports he has been diagnosed with 'afib' in the past.  CHA2DS2-Vasc at least 2.  On AC anyway for recurrent VTE. -Continue warfarin  History of recurrent VTE -Continue warfarin -Daily INR  Smoking -Smoking cessation recommended, modalities discussed  Remote history of lymphoma CT angiogram shows unchanged hilar and mediastinal lymphadenopathy and pulmonary nodules.  Alcoholic pancytopenia Suspect this is from alcoholism.  No signs of blood loss.  Blood counts no change      DVT prophylaxis: SCDs Code Status: FULL Family Communication: None present MDM and disposition Plan: The below labs  and imaging reports were reviewed and summarized above.  Medication adjustments as above.  The patient was admitted with dyspnea, found to have alcohol withdrawal progressed to delirium requiring ICU admission and Precedex.  He is now been weaned off of Precedex and is improving, but is significantly debilitated from baseline (he runs 2 miles several times a week at baseline, but at present he can barely get out of bed or precious teeth).  He will continue working with nursing and physical therapy and strengthening, then likely home in the next 1 to 2 days.   Consultants:   CCM  Cardiology  Procedures:   None  Antimicrobials:   None    Subjective: No new fever, cough, sputum, dyspnea, dizziness, confusion, disorientation.  His tremor persists, but he has no change in his global weakness, and is actually feeling stronger.      Objective: Vitals:   06/10/18 0008 06/10/18 0602 06/10/18 0828 06/10/18 1407  BP: 98/83 107/78 (!) 119/95 102/83  Pulse: 74 78 94 (!) 55  Resp: 15 16  18   Temp: 98.3 F (36.8 C) 98.3 F (36.8 C)  98 F (36.7 C)  TempSrc: Oral Oral  Oral  SpO2:  100%  99%  Weight:  88.1 kg (194 lb 3.6 oz)    Height:        Intake/Output Summary (Last 24 hours) at 06/10/2018 1531 Last data filed at 06/09/2018 1922 Gross per 24 hour  Intake 300 ml  Output 250 ml  Net 50 ml   Filed Weights   06/08/18 0500 06/09/18 0600 06/10/18 0602  Weight: 88.9 kg (195 lb 15.8 oz) 89 kg (196 lb 3.4 oz) 88.1 kg (194 lb  3.6 oz)    Examination: General appearance: Thin adult male, no acute distress, sitting up in bed, interactive. HEENT: Anicteric, conjunctival normal, lids and lashes normal.  Lips moist, teeth normal, no oral lesions, oropharynx moist, hearing normal. Skin: Skin warm and dry, no suspicious rashes or lesions.  No spider telangiectasias, no stigmata of chronic liver disease. Cardiac: Regular rate and rhythm, no murmurs, no lower extremity edema Respiratory:  Respiratory rate and rhythm, lungs clear to auscultation without rales or wheezes Abdomen: Abdomen soft without tenderness to palpation or guarding.  No hepatospleno megaly. MSK: No deformities or effusions. Neuro: Awake, responsive to questions, oriented to person, place, and time.  Strength 5/5 in the upper and lower extremities bilaterally.  Still appears very tremulous and discoordinated.   Gait unsteady, shuffling and tremulous. Psych: Sensorium intact and responding to questions, but marked psychomotor slowing, no hallucinations, attention normal.    Data Reviewed: I have personally reviewed following labs and imaging studies:  CBC: Recent Labs  Lab 06/06/18 0413 06/07/18 0730 06/08/18 0525 06/09/18 0306 06/10/18 0422  WBC 4.1 4.0 3.4* 3.6* 3.6*  HGB 11.7* 12.3* 12.1* 11.9* 11.1*  HCT 34.7* 36.6* 37.2* 35.1* 33.4*  MCV 77.3* 77.9* 78.6 77.0* 77.9*  PLT 89* 101* 100* 113* 094*   Basic Metabolic Panel: Recent Labs  Lab 06/05/18 0618 06/06/18 0413 06/06/18 7096 06/07/18 0410 06/08/18 0525 06/09/18 0306 06/10/18 0422  NA 138 137  --  138 137 135 138  K 3.6 4.1  --  4.3 3.6 3.6 3.5  CL 98 103  --  105 103 102 105  CO2 26 24  --  20* 24 22 25   GLUCOSE 88 129*  --  105* 93 88 109*  BUN 8 10  --  10 11 12 8   CREATININE 1.04 0.99  --  0.91 0.87 0.88 0.84  CALCIUM 8.7* 8.6*  --  8.7* 8.7* 8.6* 8.6*  MG 1.5*  --  1.5* 1.7  --   --   --   PHOS  --   --   --  2.9  --   --   --    GFR: Estimated Creatinine Clearance: 105.3 mL/min (by C-G formula based on SCr of 0.84 mg/dL). Liver Function Tests: Recent Labs  Lab 06/04/18 1528 06/05/18 0618 06/08/18 0525  AST 172* 192* 72*  ALT 110* 113* 63*  ALKPHOS 66 68 58  BILITOT 1.2 1.9* 1.2  PROT 7.1 6.8 5.8*  ALBUMIN 3.7 3.8 3.0*   Recent Labs  Lab 06/04/18 1528  LIPASE 59*   Recent Labs  Lab 06/07/18 1643  AMMONIA 26   Coagulation Profile: Recent Labs  Lab 06/06/18 0413 06/07/18 0410 06/08/18 0525  06/09/18 0306 06/10/18 0422  INR 2.26 2.03 2.50 2.46 2.47   Cardiac Enzymes: Recent Labs  Lab 06/04/18 1519 06/04/18 1528 06/05/18 0203 06/05/18 0618 06/05/18 1219  CKTOTAL  --  262  --   --   --   TROPONINI <0.03  --  <0.03 0.03* <0.03   BNP (last 3 results) No results for input(s): PROBNP in the last 8760 hours. HbA1C: No results for input(s): HGBA1C in the last 72 hours. CBG: Recent Labs  Lab 06/09/18 0727 06/09/18 1258 06/09/18 1559 06/09/18 2224 06/10/18 0633  GLUCAP 95 95 110* 110* 96   Lipid Profile: No results for input(s): CHOL, HDL, LDLCALC, TRIG, CHOLHDL, LDLDIRECT in the last 72 hours. Thyroid Function Tests: No results for input(s): TSH, T4TOTAL, FREET4, T3FREE, THYROIDAB in the last 72  hours. Anemia Panel: No results for input(s): VITAMINB12, FOLATE, FERRITIN, TIBC, IRON, RETICCTPCT in the last 72 hours. Urine analysis:    Component Value Date/Time   COLORURINE AMBER (A) 06/04/2018 1818   APPEARANCEUR CLEAR 06/04/2018 1818   LABSPEC 1.025 06/04/2018 1818   PHURINE 6.0 06/04/2018 1818   GLUCOSEU NEGATIVE 06/04/2018 1818   HGBUR LARGE (A) 06/04/2018 1818   BILIRUBINUR NEGATIVE 06/04/2018 1818   KETONESUR NEGATIVE 06/04/2018 1818   PROTEINUR 100 (A) 06/04/2018 1818   UROBILINOGEN 1.0 01/25/2013 0825   NITRITE NEGATIVE 06/04/2018 1818   LEUKOCYTESUR NEGATIVE 06/04/2018 1818   Sepsis Labs: @LABRCNTIP (procalcitonin:4,lacticacidven:4)  ) Recent Results (from the past 240 hour(s))  MRSA PCR Screening     Status: None   Collection Time: 06/06/18  1:47 AM  Result Value Ref Range Status   MRSA by PCR NEGATIVE NEGATIVE Final    Comment:        The GeneXpert MRSA Assay (FDA approved for NASAL specimens only), is one component of a comprehensive MRSA colonization surveillance program. It is not intended to diagnose MRSA infection nor to guide or monitor treatment for MRSA infections. Performed at [facility], [place] [address],  [place], [place] [address]          Radiology Studies: No results found.      Scheduled Meds: . chlordiazePOXIDE  25 mg Oral Daily  . escitalopram  10 mg Oral Daily  . folic acid  1 mg Oral Daily  . gabapentin  300 mg Oral TID  . mouth rinse  15 mL Mouth Rinse BID  . metoprolol tartrate  25 mg Oral BID  . multivitamin with minerals  1 tablet Oral Daily  . thiamine  100 mg Oral Daily   Or  . thiamine  100 mg Intravenous Daily  . warfarin  5 mg Oral ONCE-1800  . Warfarin - Pharmacist Dosing Inpatient   Does not apply q1800   Continuous Infusions:    LOS: 6 days    Time spent: 25 minutes   Edwin Dada, MD [facility] 06/10/2018, 3:31 PM     Pager [phone] --- please page though AMION:  [email or web address] Password [email or web address] If 7PM-7AM, please contact night-coverage

## 2018-06-10 NOTE — Progress Notes (Signed)
ANTICOAGULATION CONSULT NOTE   Pharmacy Consult for Coumadin Indication: h/o PE and mitral valve repair  No Known Allergies  Patient Measurements: Height: 5\' 9"  (175.3 cm) Weight: 194 lb 3.6 oz (88.1 kg) IBW/kg (Calculated) : 70.7  Vital Signs: Temp: 98.3 F (36.8 C) (08/03 0602) Temp Source: Oral (08/03 0602) BP: 119/95 (08/03 0828) Pulse Rate: 94 (08/03 0828)  Labs: Recent Labs    06/08/18 0525 06/09/18 0306 06/10/18 0422  HGB 12.1* 11.9* 11.1*  HCT 37.2* 35.1* 33.4*  PLT 100* 113* 137*  LABPROT 26.8* 26.5* 26.5*  INR 2.50 2.46 2.47  CREATININE 0.87 0.88 0.84   Assessment: 57yo male c/o tremors, dizziness, and nausea, admitted for further w/u, to continue Coumadin for h/o PE and mitral valve repair. On warfarin 5 mg daily PTA.   Admitting INR above goal (3.73) decreased to 2.0 yesterday. Dose was held 7/28. INR therapeutic at 2.47. H/H stable. No bleeding issues noted.   Goal of Therapy:  INR 2-3 per outpt [facility] clinic notes   Plan: Will give Warfarin 5mg  tonight (home dose) F/U AM labs and adjust as needed.  Marlon Suleiman A. Levada Dy, PharmD, [place] Pager: [phone] Please utilize Amion for appropriate phone number to reach the unit pharmacist ([place])   06/10/2018 11:01 AM

## 2018-06-11 LAB — BASIC METABOLIC PANEL
Anion gap: 8 (ref 5–15)
BUN: 9 mg/dL (ref 6–20)
CO2: 26 mmol/L (ref 22–32)
Calcium: 8.5 mg/dL — ABNORMAL LOW (ref 8.9–10.3)
Chloride: 105 mmol/L (ref 98–111)
Creatinine, Ser: 0.91 mg/dL (ref 0.61–1.24)
GFR calc Af Amer: >60 mL/min (ref >60)
GFR calc non Af Amer: >60 mL/min (ref >60)
Glucose, Bld: 97 mg/dL (ref 70–99)
Potassium: 3.5 mmol/L (ref 3.5–5.1)
Sodium: 139 mmol/L (ref 135–145)

## 2018-06-11 LAB — CBC
HCT: 32.7 % — ABNORMAL LOW (ref 39.0–52.0)
Hemoglobin: 10.5 g/dL — ABNORMAL LOW (ref 13.0–17.0)
MCH: 25.5 pg — ABNORMAL LOW (ref 26.0–34.0)
MCHC: 32.1 g/dL (ref 30.0–36.0)
MCV: 79.6 fL (ref 78.0–100.0)
Platelets: 158 10*3/uL (ref 150–400)
RBC: 4.11 MIL/uL — ABNORMAL LOW (ref 4.22–5.81)
RDW: 18 % — ABNORMAL HIGH (ref 11.5–15.5)
WBC: 3.2 10*3/uL — ABNORMAL LOW (ref 4.0–10.5)

## 2018-06-11 LAB — GLUCOSE, CAPILLARY
Glucose-Capillary: 114 mg/dL — ABNORMAL HIGH (ref 70–99)
Glucose-Capillary: 119 mg/dL — ABNORMAL HIGH (ref 70–99)
Glucose-Capillary: 122 mg/dL — ABNORMAL HIGH (ref 70–99)
Glucose-Capillary: 123 mg/dL — ABNORMAL HIGH (ref 70–99)
Glucose-Capillary: 132 mg/dL — ABNORMAL HIGH (ref 70–99)
Glucose-Capillary: 99 mg/dL (ref 70–99)

## 2018-06-11 LAB — PROTIME-INR
INR: 2.22
Prothrombin Time: 24.4 seconds — ABNORMAL HIGH (ref 11.4–15.2)

## 2018-06-11 MED ORDER — WARFARIN SODIUM 5 MG PO TABS
5.0000 mg | ORAL_TABLET | Freq: Once | ORAL | Status: AC
  Administered 2018-06-11: 5 mg via ORAL
  Filled 2018-06-11: qty 1

## 2018-06-11 MED ORDER — GABAPENTIN 300 MG PO CAPS: 300.0000 mg | ORAL_CAPSULE | Freq: Every day | ORAL | Status: DC

## 2018-06-11 NOTE — Progress Notes (Signed)
[place] for Coumadin Indication: h/o PE and mitral valve repair  No Known Allergies  Patient Measurements: Height: 5\' 9"  (175.3 cm) Weight: 200 lb 13.4 oz (91.1 kg) IBW/kg (Calculated) : 70.7  Vital Signs: Temp: 97.7 F (36.5 C) (08/04 0519) Temp Source: Oral (08/04 0519) BP: 115/95 (08/04 0804) Pulse Rate: 81 (08/04 0804)  Labs: Recent Labs    06/09/18 0306 06/10/18 0422 06/11/18 0455  HGB 11.9* 11.1* 10.5*  HCT 35.1* 33.4* 32.7*  PLT 113* 137* 158  LABPROT 26.5* 26.5* 24.4*  INR 2.46 2.47 2.22  CREATININE 0.88 0.84 0.91   Assessment: 57yo male c/o tremors, dizziness, and nausea, admitted for further w/u, to continue Coumadin for h/o PE and mitral valve repair. On warfarin 5 mg daily PTA.   Admitting INR above goal (3.73) decreased to 2.0 yesterday. Dose was held 7/28. INR therapeutic at 2.22. H/H stable. No bleeding issues noted.   Goal of Therapy:  INR 2-3 per outpt [facility] clinic notes   Plan: Will give Warfarin 5mg  tonight (home dose) F/U AM labs and adjust as needed.  Kahlen Morais A. Levada Dy, PharmD, [place] Pager: [phone] Please utilize Amion for appropriate phone number to reach the unit pharmacist ([place])   06/11/2018 11:29 AM

## 2018-06-11 NOTE — Progress Notes (Signed)
PROGRESS NOTE    Leonard Bentley  YFV:[id number] DOB: July 26, 1961 DOA: 06/04/2018 PCP: [name], MD      Brief Narrative:  Leonard Bentley is a 57 y.o. M with hx alcoholism, PE on warfarin, chronic systolic and diastolic CHF and MVR 1638 who presents with 3 weeks progressive dyspnea on exertion, found to be withdrawing heavily from alcohol.  Overnight 7/29, patient developed delirium, required initiation of Precedex and ICU transfer.     Assessment & Plan:  Delirium tremens Alcohol withdrawal Precedex weaned several days ago.  Patient improving.  Completed Librium taper today.  Gabapentin made him very sleepy. -Taper gabapentin to 300 mg nightly -Consult SW re: alcohol -Alcohol cessation complete recommended and dsicussed   Acute on chronic systolic and diastolic CHF EF 50 to 46% in April 2018.  Cr stable.  Still appears euvolemic. -Strict I/Os, daily weights  Alcoholic hepatitis Tylenol undetectable, hepatitis panel negative, HIV negative.  Presumably from alcohol.  LFTs were improving -Complete alcohol abstinence -Repeat LFTs as an outpatient  Hypertension Blood pressure normal.     Atrial fibrillation, paroxysmal Mitral valve replacement, Edwards Physio II ring He has a history of atrial tachycardia before, although I don't see this being called "AFib" by his Cardiologist.  Patient reports he has been diagnosed with 'afib' in the past.  CHA2DS2-Vasc at least 2.  On AC anyway for recurrent VTE. -Continue metoprolol -Continue warfarin  History of recurrent VTE -Continue warfarin -Daily INR  Smoking -Smoking cessation recommended, modalities discussed  Remote history of lymphoma CT angiogram shows unchanged hilar and mediastinal lymphadenopathy and pulmonary nodules.  Alcoholic pancytopenia Suspect this is from alcoholism.  No signs of blood loss.        DVT prophylaxis: SCDs Code Status: FULL Family Communication: None present MDM and disposition Plan: Labs  and imaging reports were reviewed and summarized above, medication management as above.  The patient was admitted with dyspnea, found to have alcohol withdrawal progressed to delirium requiring ICU admission and Precedex.  He is now been weaned off of Precedex and is improving, but is significantly debilitated from baseline (he runs 2 miles several times a week at baseline, but at present he can barely get out of bed or precious teeth).  He will continue working with nursing and physical therapy and strengthening, probably home tomorrow.   Consultants:   CCM  Cardiology  Procedures:   None  Antimicrobials:   None    Subjective: No new fever, cough, sputum, dyspnea, dizziness, confusion, disorientation, orthopnea, leg swelling, palpitations.  He is slowly improving in his strength.  Will walk in the hall today with sitting.  Per nursing, still unable to walk to the bathroom from bed without assistance.      Objective: Vitals:   06/10/18 2025 06/11/18 0519 06/11/18 0804 06/11/18 1337  BP: 109/82 110/83 (!) 115/95 (!) 117/96  Pulse: 94 67 81 80  Resp: 17 17  16   Temp: 98 F (36.7 C) 97.7 F (36.5 C)  98.2 F (36.8 C)  TempSrc: Oral Oral  Oral  SpO2: 98% 98%  98%  Weight:  91.1 kg (200 lb 13.4 oz)    Height:        Intake/Output Summary (Last 24 hours) at 06/11/2018 1615 Last data filed at 06/11/2018 1435 Gross per 24 hour  Intake 800 ml  Output -  Net 800 ml   Filed Weights   06/09/18 0600 06/10/18 0602 06/11/18 0519  Weight: 89 kg (196 lb 3.4 oz) 88.1  kg (194 lb 3.6 oz) 91.1 kg (200 lb 13.4 oz)    Examination: General appearance: Thin adult male, no acute distress, sitting up in bed, interactive HEENT: Anicteric, conjunctival pink, lids and lashes normal.  No teeth, no oral lesions, oropharynx moist, hearing normal Skin: Skin warm and dry, no suspicious rashes or lesions.  No spider telling ectasias or other stigmata of chronic liver disease Cardiac: Regular rate  and rhythm, no murmurs, no lower extremity edema Respiratory: Respiratory rate and rhythm are normal, lungs clear to auscultation without rales or wheezes  abdomen: No right upper quadrant tenderness, guarding. MSK: No deformities or effusions. Neuro: Awake and responsive to questions, oriented to person, place, and time.  Strength 5/5 in upper extremities, lower extremities are weak, with ambulation he is tremulous shuffling and unsteady. Psych: Sensorium intact responding to questions, but psychomoto slowing is noted, no hallucinations, no confusion.    Data Reviewed: I have personally reviewed following labs and imaging studies:  CBC: Recent Labs  Lab 06/07/18 0730 06/08/18 0525 06/09/18 0306 06/10/18 0422 06/11/18 0455  WBC 4.0 3.4* 3.6* 3.6* 3.2*  HGB 12.3* 12.1* 11.9* 11.1* 10.5*  HCT 36.6* 37.2* 35.1* 33.4* 32.7*  MCV 77.9* 78.6 77.0* 77.9* 79.6  PLT 101* 100* 113* 137* 008   Basic Metabolic Panel: Recent Labs  Lab 06/05/18 0618  06/06/18 6761 06/07/18 0410 06/08/18 0525 06/09/18 0306 06/10/18 0422 06/11/18 0455  NA 138   < >  --  138 137 135 138 139  K 3.6   < >  --  4.3 3.6 3.6 3.5 3.5  CL 98   < >  --  105 103 102 105 105  CO2 26   < >  --  20* 24 22 25 26   GLUCOSE 88   < >  --  105* 93 88 109* 97  BUN 8   < >  --  10 11 12 8 9   CREATININE 1.04   < >  --  0.91 0.87 0.88 0.84 0.91  CALCIUM 8.7*   < >  --  8.7* 8.7* 8.6* 8.6* 8.5*  MG 1.5*  --  1.5* 1.7  --   --   --   --   PHOS  --   --   --  2.9  --   --   --   --    < > = values in this interval not displayed.   GFR: Estimated Creatinine Clearance: 98.7 mL/min (by C-G formula based on SCr of 0.91 mg/dL). Liver Function Tests: Recent Labs  Lab 06/05/18 0618 06/08/18 0525  AST 192* 72*  ALT 113* 63*  ALKPHOS 68 58  BILITOT 1.9* 1.2  PROT 6.8 5.8*  ALBUMIN 3.8 3.0*   No results for input(s): LIPASE, AMYLASE in the last 168 hours. Recent Labs  Lab 06/07/18 1643  AMMONIA 26   Coagulation  Profile: Recent Labs  Lab 06/07/18 0410 06/08/18 0525 06/09/18 0306 06/10/18 0422 06/11/18 0455  INR 2.03 2.50 2.46 2.47 2.22   Cardiac Enzymes: Recent Labs  Lab 06/05/18 0203 06/05/18 0618 06/05/18 1219  TROPONINI <0.03 0.03* <0.03   BNP (last 3 results) No results for input(s): PROBNP in the last 8760 hours. HbA1C: No results for input(s): HGBA1C in the last 72 hours. CBG: Recent Labs  Lab 06/11/18 0004 06/11/18 0613 06/11/18 0749 06/11/18 1203 06/11/18 1609  GLUCAP 119* 99 122* 114* 123*   Lipid Profile: No results for input(s): CHOL, HDL, LDLCALC, TRIG, CHOLHDL, LDLDIRECT in  the last 72 hours. Thyroid Function Tests: No results for input(s): TSH, T4TOTAL, FREET4, T3FREE, THYROIDAB in the last 72 hours. Anemia Panel: No results for input(s): VITAMINB12, FOLATE, FERRITIN, TIBC, IRON, RETICCTPCT in the last 72 hours. Urine analysis:    Component Value Date/Time   COLORURINE AMBER (A) 06/04/2018 1818   APPEARANCEUR CLEAR 06/04/2018 1818   LABSPEC 1.025 06/04/2018 1818   PHURINE 6.0 06/04/2018 1818   GLUCOSEU NEGATIVE 06/04/2018 1818   HGBUR LARGE (A) 06/04/2018 1818   BILIRUBINUR NEGATIVE 06/04/2018 1818   KETONESUR NEGATIVE 06/04/2018 1818   PROTEINUR 100 (A) 06/04/2018 1818   UROBILINOGEN 1.0 01/25/2013 0825   NITRITE NEGATIVE 06/04/2018 1818   LEUKOCYTESUR NEGATIVE 06/04/2018 1818   Sepsis Labs: @LABRCNTIP (procalcitonin:4,lacticacidven:4)  ) Recent Results (from the past 240 hour(s))  MRSA PCR Screening     Status: None   Collection Time: 06/06/18  1:47 AM  Result Value Ref Range Status   MRSA by PCR NEGATIVE NEGATIVE Final    Comment:        The GeneXpert MRSA Assay (FDA approved for NASAL specimens only), is one component of a comprehensive MRSA colonization surveillance program. It is not intended to diagnose MRSA infection nor to guide or monitor treatment for MRSA infections. Performed at [facility], [place] [address]  [place], [place] [address]          Radiology Studies: No results found.      Scheduled Meds: . escitalopram  10 mg Oral Daily  . folic acid  1 mg Oral Daily  . [START ON 06/12/2018] gabapentin  300 mg Oral QHS  . mouth rinse  15 mL Mouth Rinse BID  . metoprolol tartrate  25 mg Oral BID  . multivitamin with minerals  1 tablet Oral Daily  . thiamine  100 mg Oral Daily   Or  . thiamine  100 mg Intravenous Daily  . warfarin  5 mg Oral ONCE-1800  . Warfarin - Pharmacist Dosing Inpatient   Does not apply q1800   Continuous Infusions:    LOS: 7 days    Time spent: 25 minutes   Edwin Dada, MD [facility] 06/11/2018, 4:15 PM     Pager [phone] --- please page though AMION:  [email or web address] Password [email or web address] If 7PM-7AM, please contact night-coverage

## 2018-06-12 LAB — GLUCOSE, CAPILLARY
Glucose-Capillary: 102 mg/dL — ABNORMAL HIGH (ref 70–99)
Glucose-Capillary: 105 mg/dL — ABNORMAL HIGH (ref 70–99)
Glucose-Capillary: 109 mg/dL — ABNORMAL HIGH (ref 70–99)
Glucose-Capillary: 93 mg/dL (ref 70–99)
Glucose-Capillary: 98 mg/dL (ref 70–99)

## 2018-06-12 LAB — CBC
HCT: 35.3 % — ABNORMAL LOW (ref 39.0–52.0)
Hemoglobin: 11.4 g/dL — ABNORMAL LOW (ref 13.0–17.0)
MCH: 25.7 pg — ABNORMAL LOW (ref 26.0–34.0)
MCHC: 32.3 g/dL (ref 30.0–36.0)
MCV: 79.5 fL (ref 78.0–100.0)
Platelets: 190 10*3/uL (ref 150–400)
RBC: 4.44 MIL/uL (ref 4.22–5.81)
RDW: 18.1 % — ABNORMAL HIGH (ref 11.5–15.5)
WBC: 3.2 10*3/uL — ABNORMAL LOW (ref 4.0–10.5)

## 2018-06-12 LAB — BASIC METABOLIC PANEL
Anion gap: 8 (ref 5–15)
BUN: 8 mg/dL (ref 6–20)
CO2: 28 mmol/L (ref 22–32)
Calcium: 8.8 mg/dL — ABNORMAL LOW (ref 8.9–10.3)
Chloride: 104 mmol/L (ref 98–111)
Creatinine, Ser: 0.85 mg/dL (ref 0.61–1.24)
GFR calc Af Amer: >60 mL/min (ref >60)
GFR calc non Af Amer: >60 mL/min (ref >60)
Glucose, Bld: 119 mg/dL — ABNORMAL HIGH (ref 70–99)
Potassium: 3.6 mmol/L (ref 3.5–5.1)
Sodium: 140 mmol/L (ref 135–145)

## 2018-06-12 LAB — PROTIME-INR
INR: 1.98
Prothrombin Time: 22.3 seconds — ABNORMAL HIGH (ref 11.4–15.2)

## 2018-06-12 MED ORDER — CHLORDIAZEPOXIDE HCL 5 MG PO CAPS
10.0000 mg | ORAL_CAPSULE | Freq: Once | ORAL | Status: AC | PRN
Start: 2018-06-12 — End: 2018-06-12
  Administered 2018-06-12: 10 mg via ORAL
  Filled 2018-06-12: qty 2

## 2018-06-12 MED ORDER — GABAPENTIN 300 MG PO CAPS: 300.0000 mg | ORAL_CAPSULE | Freq: Every day | ORAL | 3 refills | Status: DC

## 2018-06-12 MED ORDER — WARFARIN SODIUM 7.5 MG PO TABS: 7.5000 mg | ORAL_TABLET | Freq: Once | ORAL | Status: DC

## 2018-06-12 MED ORDER — WARFARIN SODIUM 7.5 MG PO TABS: ORAL_TABLET | ORAL | 3 refills | Status: DC

## 2018-06-12 MED ORDER — WARFARIN SODIUM 7.5 MG PO TABS
7.5000 mg | ORAL_TABLET | ORAL | Status: AC
  Administered 2018-06-12: 7.5 mg via ORAL
  Filled 2018-06-12: qty 1

## 2018-06-12 MED ORDER — METOPROLOL TARTRATE 25 MG PO TABS: 25.0000 mg | ORAL_TABLET | Freq: Two times a day (BID) | ORAL | 3 refills | Status: DC

## 2018-06-12 NOTE — Discharge Summary (Addendum)
Physician Discharge Summary  Leonard Bentley XVQ:[id number] DOB: 1961/05/01 DOA: 06/04/2018  PCP: [name], MD  Admit date: 06/04/2018 Discharge date: 06/12/2018  Admitted From: Home  Disposition:  Home    Recommendations for Outpatient Follow-up:  1. Follow up with new PCP in 1 week 2. Please obtain INR on 06/22/18; INR has been stable in hospital, repeat INR can be at intervals as determined by outpatient clinic 3. Follow up anxiety, and consider initiating SSRI 4. Wean gabapentin in 1-2 months 5. Please repeat CBC and LFTs in 4-6 weeks 6. Please repeat UA for microscopic hematuria in 6 weeks    Home Health: Yes  Equipment/Devices: None  Discharge Condition: Fair  CODE STATUS: FULL Diet recommendation: Regular  Brief/Interim Summary: Leonard Bentley is a 57 y.o. 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  Overnight 7/29, patient developed delirium, required initiation of Precedex and ICU transfer.       Discharge Diagnoses:   Delirium tremens Alcohol withdrawal Patient has no history of delirium from alcohol withdrawal.  Presented with tremors, anxiety, withdrawal symptoms, progressed during the second night in hospital to agitation, confusion.  Transferred to ICU, started on Precedex.  Precedex weaned after 48 hours.  Treated with 4 [place] Librium taper.  Complete alcohol cessation recommended and discussed.   Started on gabapentin given residual withdrawal symptoms, recommended outpatient taper.   Anxiety Long-standing problem.  Not previously treated except with benzodiazepines on PRN basis before, per patient.  Was started briefly on escitalopram in hospital by Cardiology, this was stopped at discharge, although certainly could be  considered again if proper follow up in place.    Chronic systolic and diastolic CHF EF 50 to 67% in April 2018.  Evaluated by Cardiology, not felt to be in HF.  Cr stable.    Alcoholic hepatitis Tylenol undetectable at admission, hepatitis panel negative, HIV negative.  Presumably from alcohol.  LFTs were improving.   Recommended alcohol abstinence.  Atrial fibrillation, paroxysmal Mitral valve replacement, Edwards Physio II ring He has a history of atrial tachycardia before, although I don't see this being called "AFib" by his Cardiologist.  Patient reports he has been diagnosed with 'afib' in the past.  CHA2DS2-Vasc at least 2.  On AC anyway for recurrent VTE.  Rates controlled with metoprolol here.   History of recurrent VTE On warfarin.  To have post-hospital INR checked now at [facility] given loss of insurance.  Smoking Smoking cessation recommended, modalities discussed  Remote history of lymphoma CT angiogram shows unchanged hilar and mediastinal lymphadenopathy and pulmonary nodules.  Alcoholic pancytopenia Suspect this is from alcoholism.  No signs of blood loss.     Microscopic hematuria Needs repeat UA in 6 weeks.  If still positive, referral to Urology.         Discharge Instructions  Discharge Instructions    Diet general   Complete by:  As directed    Discharge instructions   Complete by:  As directed    From Dr. Loleta Books: You were admitted for trouble breathing and dizziness, and developed delirium tremens (DTs) from alcohol withdrawal. Your trouble breathing and dizziness were from mild congestive heart failure we believed, perhaps precipitated by withdrawal and you were treated for this and back to normal.  The delirium required high doses of  sedatives, called Precedex, and ICU care.  This also resolved.  You were treated then with Librium until your symptoms were more manageable.   For the next few weeks or months, you should take Gabapentin 300 mg at  night for withdrawal symptoms.   Eventually, you should stop taking this medicine and use over the counter sleep aids for sleep.  It is absolutely necessary that you stop any and all alcohol use. If needed, I recommend treatment through [facility] or [facility], but I encourage you to follow up with Francis Dowse at your next appointment for this.  See the [facility] in 1 week August 15 for appointment and INR check. Take warfarin 5 mg daily except Mondays take 7.5 mg that [place]. Follow up with the [facility] for INR checks.   Increase activity slowly   Complete by:  As directed      Allergies as of 06/12/2018   No Known Allergies     Medication List    STOP taking these medications   meclizine 25 MG tablet Commonly known as:  ANTIVERT     TAKE these medications   diclofenac sodium 1 % Gel Commonly known as:  VOLTAREN Apply two grams. Use four times daily for pain What changed:    how much to take  how to take this  when to take this  reasons to take this  additional instructions   gabapentin 300 MG capsule Commonly known as:  NEURONTIN Take 1 capsule (300 mg total) by mouth at bedtime.   metoprolol tartrate 25 MG tablet Commonly known as:  LOPRESSOR Take 1 tablet (25 mg total) by mouth 2 (two) times daily.   REDNESS RELIEVER EYE DROPS 0.05 % ophthalmic solution Generic drug:  tetrahydrozoline Place 1 drop into both eyes daily as needed (red eyes).   triamcinolone cream 0.1 % Commonly known as:  KENALOG Apply 1 application topically 2 (two) times daily.   warfarin 7.5 MG tablet Commonly known as:  COUMADIN Take as directed. If you are unsure how to take this medication, talk to your nurse or doctor. Original instructions:  Take 5 mg (one tablet) once daily except Mondays take 7.5 mg (1 1/2 tabs). What changed:    medication strength  additional instructions      Follow-up Information    [name], MD. Schedule  an appointment as soon as possible for a visit in 2 days.   Specialty:  Family Medicine Why:  As needed Contact information: [place] [address] [phone]        Network, [facility] Follow up.   Why:  Please contact or go to office to apply for orange card. Contact information: [place] [address] [phone]        Services, [facility]. Go on 06/29/2018.   Why:  be at facility by 8am- bring [place] ID, 30 [address] supply of medication plus refill, hospital DC paperwork, clothing.  Please call and cancel if you obtain placement elsewhere. Contact information: [address] [place] [address] [phone]        [facility]. Go on 06/22/2018.   Why:  10:30 am with Freeman Caldron PA Contact information: [place] [address] [phone]         No Known Allergies  Consultations:  Cardiology  Critical care   Procedures/Studies: Ct Angio Chest Pe W  Or Wo Contrast  Result Date: 06/05/2018 CLINICAL DATA:  Shortness of breath. History of pulmonary embolism, mitral valve repair, non-Hodgkin's lymphoma. EXAM: CT ANGIOGRAPHY CHEST WITH CONTRAST TECHNIQUE: Multidetector CT imaging of the chest was performed using the standard protocol during bolus administration of intravenous contrast. Multiplanar CT image reconstructions and MIPs were obtained to evaluate the vascular anatomy. CONTRAST:  <See Chart> ISOVUE-370 IOPAMIDOL (ISOVUE-370) INJECTION 76% COMPARISON:  Chest CT September 21, 2017 FINDINGS: CARDIOVASCULAR: Adequate contrast opacification of the pulmonary artery's. Main pulmonary artery is not enlarged. No pulmonary arterial filling defects to the level of the subsegmental branches. Moderate cardiomegaly. No pericardial effusion. Mitral valve replacement. No pericardial effusion. Thoracic aorta is normal course and caliber,  unremarkable. MEDIASTINUM/NODES: Similar mediastinal and hilar lymphadenopathy. LUNGS/PLEURA: Tracheobronchial tree is patent, no pneumothorax. Innumerable subcentimeter pulmonary nodules similar to prior CT. No pleural effusion or focal consolidation. RIGHT middle lobe atelectasis/scarring. UPPER ABDOMEN: Nonacute.  Hepatic steatosis and calcified granuloma. MUSCULOSKELETAL: Nonacute.  Moderate thoracic spondylosis. Review of the MIP images confirms the above findings. IMPRESSION: 1. No acute pulmonary embolism. 2. Stable mediastinal and hilar lymphadenopathy. 3. Similar innumerable small pulmonary nodules. Electronically Signed   By: Elon Alas M.D.   On: 06/05/2018 04:19       Subjective: Feeling better today.  Still tremor, still anxious, but no confusion.  No headache, chest pain, dyspnea on exertion, leg sewlling.  Discharge Exam: Vitals:   06/12/18 0806 06/12/18 1424  BP: (!) 116/94 109/90  Pulse: 66 61  Resp: 19 19  Temp: 98.3 F (36.8 C) (!) 97.5 F (36.4 C)  SpO2: 100% 100%   Vitals:   06/11/18 2133 06/12/18 0437 06/12/18 0806 06/12/18 1424  BP: 107/90 (!) 116/94 (!) 116/94 109/90  Pulse: 78 68 66 61  Resp:  18 19 19   Temp:  97.9 F (36.6 C) 98.3 F (36.8 C) (!) 97.5 F (36.4 C)  TempSrc:  Oral Oral Oral  SpO2:  100% 100% 100%  Weight:  87.5 kg (192 lb 14.4 oz)    Height:        General: Pt is alert, awake, not in acute distress, sitting on edge of bed, stands easily Cardiovascular: RRR, S1/S2 +, no rubs, no gallops Respiratory: CTA bilaterally, no wheezing, no rhonchi Abdominal: Soft, NT, ND, bowel sounds + Extremities: no edema, no cyanosis    The results of significant diagnostics from this hospitalization (including imaging, microbiology, ancillary and laboratory) are listed below for reference.     Microbiology: Recent Results (from the past 240 hour(s))  MRSA PCR Screening     Status: None   Collection Time: 06/06/18  1:47 AM  Result Value Ref  Range Status   MRSA by PCR NEGATIVE NEGATIVE Final    Comment:        The GeneXpert MRSA Assay (FDA approved for NASAL specimens only), is one component of a comprehensive MRSA colonization surveillance program. It is not intended to diagnose MRSA infection nor to guide or monitor treatment for MRSA infections. Performed at [facility], [place] [address], [place], [place] [address]      Labs: BNP (last 3 results) No results for input(s): BNP in the last 8760 hours. Basic Metabolic Panel: Recent Labs  Lab 06/06/18 0613 06/07/18 0410 06/08/18 0525 06/09/18 0306 06/10/18 0422 06/11/18 0455 06/12/18 0500  NA  --  138 137 135 138 139 140  K  --  4.3 3.6 3.6 3.5 3.5 3.6  CL  --  105 103 102 105  105 104  CO2  --  20* 24 22 25 26 28   GLUCOSE  --  105* 93 88 109* 97 119*  BUN  --  10 11 12 8 9 8   CREATININE  --  0.91 0.87 0.88 0.84 0.91 0.85  CALCIUM  --  8.7* 8.7* 8.6* 8.6* 8.5* 8.8*  MG 1.5* 1.7  --   --   --   --   --   PHOS  --  2.9  --   --   --   --   --    Liver Function Tests: Recent Labs  Lab 06/08/18 0525  AST 72*  ALT 63*  ALKPHOS 58  BILITOT 1.2  PROT 5.8*  ALBUMIN 3.0*   No results for input(s): LIPASE, AMYLASE in the last 168 hours. Recent Labs  Lab 06/07/18 1643  AMMONIA 26   CBC: Recent Labs  Lab 06/08/18 0525 06/09/18 0306 06/10/18 0422 06/11/18 0455 06/12/18 0500  WBC 3.4* 3.6* 3.6* 3.2* 3.2*  HGB 12.1* 11.9* 11.1* 10.5* 11.4*  HCT 37.2* 35.1* 33.4* 32.7* 35.3*  MCV 78.6 77.0* 77.9* 79.6 79.5  PLT 100* 113* 137* 158 190   Cardiac Enzymes: No results for input(s): CKTOTAL, CKMB, CKMBINDEX, TROPONINI in the last 168 hours. BNP: Invalid input(s): POCBNP CBG: Recent Labs  Lab 06/12/18 0034 06/12/18 0433 06/12/18 0742 06/12/18 1230 06/12/18 1603  GLUCAP 102* 109* 105* 93 98   D-Dimer No results for input(s): DDIMER in the last 72 hours. Hgb A1c No results for input(s): HGBA1C in the last 72 hours. Lipid Profile No  results for input(s): CHOL, HDL, LDLCALC, TRIG, CHOLHDL, LDLDIRECT in the last 72 hours. Thyroid function studies No results for input(s): TSH, T4TOTAL, T3FREE, THYROIDAB in the last 72 hours.  Invalid input(s): FREET3 Anemia work up No results for input(s): VITAMINB12, FOLATE, FERRITIN, TIBC, IRON, RETICCTPCT in the last 72 hours. Urinalysis    Component Value Date/Time   COLORURINE AMBER (A) 06/04/2018 1818   APPEARANCEUR CLEAR 06/04/2018 1818   LABSPEC 1.025 06/04/2018 1818   PHURINE 6.0 06/04/2018 1818   GLUCOSEU NEGATIVE 06/04/2018 1818   HGBUR LARGE (A) 06/04/2018 1818   BILIRUBINUR NEGATIVE 06/04/2018 1818   KETONESUR NEGATIVE 06/04/2018 1818   PROTEINUR 100 (A) 06/04/2018 1818   UROBILINOGEN 1.0 01/25/2013 0825   NITRITE NEGATIVE 06/04/2018 1818   LEUKOCYTESUR NEGATIVE 06/04/2018 1818   Sepsis Labs Invalid input(s): PROCALCITONIN,  WBC,  LACTICIDVEN Microbiology Recent Results (from the past 240 hour(s))  MRSA PCR Screening     Status: None   Collection Time: 06/06/18  1:47 AM  Result Value Ref Range Status   MRSA by PCR NEGATIVE NEGATIVE Final    Comment:        The GeneXpert MRSA Assay (FDA approved for NASAL specimens only), is one component of a comprehensive MRSA colonization surveillance program. It is not intended to diagnose MRSA infection nor to guide or monitor treatment for MRSA infections. Performed at [facility], [place] [address], [place], [place] [address]      Time coordinating discharge: 50 minutes       SIGNED:   Edwin Dada, MD  [facility] 06/12/2018, 5:00 PM

## 2018-06-12 NOTE — Care Management Note (Signed)
Case Management Note  Patient Details  Name: Leonard Bentley MRN: [id number] Date of Birth: 1961-06-12  Subjective/Objective:     Admitted with ETOH withdrawal and SOB. PT with acute agitation and encephalopathy with withdrawal and precedex drip. PMHx: MVR, CHF, PE, tremors, non Hodgkin lymphoma.              Action/Plan: Transition to home. Pt declined home health services.Pt f/u with [facility] 06/22/2018  @ 10:30am. Pt states brother to provide transportation to home.  Expected Discharge Date:  06/12/18               Expected Discharge Plan:  Home/Self Care  In-House Referral:     Discharge planning Services  CM Consult, Follow-up appt scheduled, [facility]  Post Acute Care Choice:    Choice offered to:     DME Arranged:    DME Agency:     HH Arranged:   declined [place] Agency:     Status of Service:  Completed, signed off  If discussed at [employer], dates discussed:    Additional Comments:  Sharin Mons, RN 06/12/2018, 3:42 PM

## 2018-06-12 NOTE — Progress Notes (Signed)
Patient discharged to home. IV removed, AVS discussed with pt, pt verbalized understanding. Patient taken down to main lobby in wheelchair and assisted into taxi ride home. Patient in stable condition.

## 2018-06-12 NOTE — Progress Notes (Signed)
Pt resting in bed, no needs voiced. Pt scored 5 CIWA for tremors and anxiety. Pt reports that "I've got a lot on my mind". VS as below. Telesitter at bedside, bed alarm on. Discussed plan of care with patient. Will continue to monitor.  BP (!) 116/94 (BP Location: Right Arm)   Pulse 66   Temp 98.3 F (36.8 C) (Oral)   Resp 19   Ht 5\' 9"  (1.753 m)   Wt 87.5 kg (192 lb 14.4 oz)   SpO2 100%   BMI 28.49 kg/m

## 2018-06-12 NOTE — Progress Notes (Signed)
CSW received another consult regarding SNF placement. CSW staffed case with [place] Surveyor, quantity. He stated that physically patient does not qualify for a Letter of Guarantee. Cognitively, patient is alert and oriented and does not qualify for an Assisted Living Letter of Guarantee either. RNCM aware and asking patient if he needs home health.   CSW signing off.  Percell Locus Alexie Samson LCSW [phone]

## 2018-06-12 NOTE — Progress Notes (Signed)
Messaged  Traid MD per pt c/o tremorous hands, BP 116/94., HR 68, anxiety self desribed at 6/10

## 2018-06-12 NOTE — Progress Notes (Signed)
[facility] for Coumadin Indication: h/o PE and mitral valve repair  No Known Allergies  Patient Measurements: Height: 5\' 9"  (175.3 cm) Weight: 192 lb 14.4 oz (87.5 kg) IBW/kg (Calculated) : 70.7  Vital Signs: Temp: 98.3 F (36.8 C) (08/05 0806) Temp Source: Oral (08/05 0806) BP: 116/94 (08/05 0806) Pulse Rate: 66 (08/05 0806)  Labs: Recent Labs    06/10/18 0422 06/11/18 0455 06/12/18 0500  HGB 11.1* 10.5* 11.4*  HCT 33.4* 32.7* 35.3*  PLT 137* 158 190  LABPROT 26.5* 24.4* 22.3*  INR 2.47 2.22 1.98  CREATININE 0.84 0.91 0.85   Assessment: 57yo male c/o tremors, dizziness, and nausea, admitted for further w/u, to continue Coumadin for h/o PE and mitral valve repair. On warfarin 5 mg daily PTA.  Admitting INR above goal - 3.73   Current INR below goal at 1.98, patient previously within goal past 7 days.  H/H stable. No bleeding issues noted.   Goal of Therapy:  INR 2-3 per outpt AC clinic notes   Plan: Warfarin 7.5mg  tonight F/U AM labs and adjust as needed.  Tamela Gammon, PharmD PGY1 Pharmacy Resident Direct Phone: [phone] 06/12/2018  12:57 PM

## 2018-06-12 NOTE — Progress Notes (Signed)
Physical Therapy Treatment Patient Details Name: Leonard Bentley MRN: [id number] DOB: 24-Jan-1961 Today's Date: 06/12/2018    History of Present Illness 57 yo admitted with ETOH withdrawal and SOB. PT with acute agitation and encephalopathy with withdrawal and precedex drip. PMHx: MVR, CHF, PE, tremors, non Hodgkin lymphoma    PT Comments    Pt ambulated in hall with RW and performed high level balance activities at hand rail. Pt with obvious balance deficits requiring min guard for safety with gait. No overt LOB noted. Pt reports he lives alone and would only have family assist 1-2x a week. Continue to feel short term SNF placement would be appropriate as pt is a fall risk.     Follow Up Recommendations  SNF;Supervision for mobility/OOB     Equipment Recommendations       Recommendations for Other Services       Precautions / Restrictions Precautions Precautions: Fall Restrictions Weight Bearing Restrictions: No    Mobility  Bed Mobility Overal bed mobility: Modified Independent                Transfers Overall transfer level: Needs assistance   Transfers: Sit to/from Stand Sit to Stand: Min guard         General transfer comment: min guard for safety  Ambulation/Gait Ambulation/Gait assistance: Min guard Gait Distance (Feet): 150 Feet Assistive device: Rolling walker (2 wheeled);None Gait Pattern/deviations: Decreased stride length;Trunk flexed;Step-through pattern     General Gait Details: Pt ambulated 10 ft in room w/o AD. Obvious balance deficits noted. Transitioned pt to RW for stability and safety. Cues for RW proximity and postural control.    Stairs             Wheelchair Mobility    Modified Rankin (Stroke Patients Only)       Balance Overall balance assessment: Needs assistance Sitting-balance support: Single extremity supported Sitting balance-Leahy Scale: Fair     Standing balance support: Bilateral upper extremity  supported Standing balance-Leahy Scale: Fair Standing balance comment: able to stand statically w/o UE support             High level balance activites: Side stepping;Braiding;Backward walking;Other (comment)(Tandem talking, Toe walking) High Level Balance Comments: High level balance activities performed at hand rail with either single or bil UE support. Instability noted, though no overt LOB. min guard for safety.            Cognition Arousal/Alertness: Awake/alert Behavior During Therapy: WFL for tasks assessed/performed Overall Cognitive Status: Within Functional Limits for tasks assessed                                        Exercises      General Comments        Pertinent Vitals/Pain Pain Assessment: No/denies pain    Home Living                      Prior Function            PT Goals (current goals can now be found in the care plan section) Acute Rehab PT Goals Patient Stated Goal: return home PT Goal Formulation: With patient Time For Goal Achievement: 06/19/18 Potential to Achieve Goals: Good Progress towards PT goals: Progressing toward goals    Frequency    Min 3X/week      PT Plan Current plan remains appropriate  Co-evaluation              AM-PAC PT "6 Clicks" Daily Activity  Outcome Measure  Difficulty turning over in bed (including adjusting bedclothes, sheets and blankets)?: A Little Difficulty moving from lying on back to sitting on the side of the bed? : A Little Difficulty sitting down on and standing up from a chair with arms (e.g., wheelchair, bedside commode, etc,.)?: Unable Help needed moving to and from a bed to chair (including a wheelchair)?: A Little Help needed walking in hospital room?: A Little Help needed climbing 3-5 steps with a railing? : A Lot 6 Click Score: 15    End of Session Equipment Utilized During Treatment: Gait belt Activity Tolerance: Patient tolerated treatment  well Patient left: with call bell/phone within reach;in bed(sitting EOB) Nurse Communication: Mobility status PT Visit Diagnosis: Other abnormalities of gait and mobility (R26.89);Unsteadiness on feet (R26.81)     Time: 9826-4158 PT Time Calculation (min) (ACUTE ONLY): 20 min  Charges:  $Gait Training: 8-22 mins                     Benjiman Core, [place] Pager [phone] Acute Rehab   Allena Katz 06/12/2018, 2:17 PM

## 2018-06-21 NOTE — Progress Notes (Deleted)
Patient ID: Leonard Bentley, male   DOB: Oct 28, 1961, 57 y.o.   MRN: [id number]    After hospitalization 7/28-8/03/2018: From discharge summary: Patient has no history of delirium from alcohol withdrawal.  Presented with tremors, anxiety, withdrawal symptoms, progressed during the second night in hospital to agitation, confusion.  Transferred to ICU, started on Precedex.  Precedex weaned after 48 hours. Treated with 4 Lung Librium taper. Complete alcohol cessation recommended and discussed.   Started on gabapentin given residual withdrawal symptoms, recommended outpatient taper.   Anxiety Long-standing problem.  Not previously treated except with benzodiazepines on PRN basis before, per patient.  Was started briefly on escitalopram in hospital by Cardiology, this was stopped at discharge, although certainly could be considered again if proper follow up in place.    Chronic systolic and diastolic CHF EF 50 to 21% in April 2018. Evaluated by Cardiology, not felt to be in HF.  Cr stable.    Alcoholic hepatitis Tylenol undetectable at admission, hepatitis panel negative, HIV negative. Presumably from alcohol. LFTs were improving.   Recommended alcohol abstinence.  Atrial fibrillation, paroxysmal Mitral valve replacement, Edwards Physio II ring He has a history of atrial tachycardia before, although I don't see this being called "AFib" by his Cardiologist. Patient reports he has been diagnosed with 'afib' in the past. CHA2DS2-Vasc at least 2. On AC anyway for recurrent VTE.  Rates controlled with metoprolol here.   History of recurrent VTE On warfarin.  To have post-hospital INR checked now at [facility] given loss of insurance.  Smoking Smoking cessation recommended, modalities discussed  Remote history of lymphoma CT angiogram shows unchanged hilar and mediastinal lymphadenopathy and pulmonary nodules.  Alcoholic pancytopenia Suspect this is from alcoholism. No signs of blood loss.    Microscopic hematuria Needs repeat UA in 6 weeks.  If still positive, referral to Urology.

## 2018-06-22 ENCOUNTER — Inpatient Hospital Stay: Payer: Self-pay

## 2018-07-25 ENCOUNTER — Other Ambulatory Visit: Payer: Self-pay

## 2018-07-25 ENCOUNTER — Inpatient Hospital Stay (HOSPITAL_COMMUNITY)
Admission: EM | Admit: 2018-07-25 | Discharge: 2018-07-30 | DRG: 897 | Disposition: A | Discharge status: Home Health | Payer: Self-pay | Attending: Internal Medicine | Admitting: Internal Medicine

## 2018-07-25 ENCOUNTER — Emergency Department (HOSPITAL_COMMUNITY): Payer: Self-pay

## 2018-07-25 ENCOUNTER — Encounter (HOSPITAL_COMMUNITY): Payer: Self-pay | Admitting: Emergency Medicine

## 2018-07-25 DIAGNOSIS — Z8249 Family history of ischemic heart disease and other diseases of the circulatory system: Secondary | ICD-10-CM

## 2018-07-25 DIAGNOSIS — I4581 Long QT syndrome: Secondary | ICD-10-CM | POA: Diagnosis present

## 2018-07-25 DIAGNOSIS — D6959 Other secondary thrombocytopenia: Secondary | ICD-10-CM | POA: Diagnosis present

## 2018-07-25 DIAGNOSIS — R791 Abnormal coagulation profile: Secondary | ICD-10-CM | POA: Diagnosis present

## 2018-07-25 DIAGNOSIS — K7010 Alcoholic hepatitis without ascites: Secondary | ICD-10-CM | POA: Diagnosis present

## 2018-07-25 DIAGNOSIS — M1990 Unspecified osteoarthritis, unspecified site: Secondary | ICD-10-CM | POA: Diagnosis present

## 2018-07-25 DIAGNOSIS — I110 Hypertensive heart disease with heart failure: Secondary | ICD-10-CM | POA: Diagnosis present

## 2018-07-25 DIAGNOSIS — F10230 Alcohol dependence with withdrawal, uncomplicated: Secondary | ICD-10-CM

## 2018-07-25 DIAGNOSIS — I5042 Chronic combined systolic (congestive) and diastolic (congestive) heart failure: Secondary | ICD-10-CM | POA: Diagnosis present

## 2018-07-25 DIAGNOSIS — R9431 Abnormal electrocardiogram [ECG] [EKG]: Secondary | ICD-10-CM | POA: Diagnosis present

## 2018-07-25 DIAGNOSIS — Z79899 Other long term (current) drug therapy: Secondary | ICD-10-CM

## 2018-07-25 DIAGNOSIS — F17210 Nicotine dependence, cigarettes, uncomplicated: Secondary | ICD-10-CM | POA: Diagnosis present

## 2018-07-25 DIAGNOSIS — Z86711 Personal history of pulmonary embolism: Secondary | ICD-10-CM | POA: Diagnosis present

## 2018-07-25 DIAGNOSIS — Z7901 Long term (current) use of anticoagulants: Secondary | ICD-10-CM

## 2018-07-25 DIAGNOSIS — F10931 Alcohol use, unspecified with withdrawal delirium: Secondary | ICD-10-CM | POA: Diagnosis present

## 2018-07-25 DIAGNOSIS — F10231 Alcohol dependence with withdrawal delirium: Principal | ICD-10-CM | POA: Diagnosis present

## 2018-07-25 DIAGNOSIS — Z8572 Personal history of non-Hodgkin lymphomas: Secondary | ICD-10-CM

## 2018-07-25 DIAGNOSIS — R441 Visual hallucinations: Secondary | ICD-10-CM | POA: Diagnosis present

## 2018-07-25 DIAGNOSIS — F10930 Alcohol use, unspecified with withdrawal, uncomplicated: Secondary | ICD-10-CM

## 2018-07-25 DIAGNOSIS — I471 Supraventricular tachycardia: Secondary | ICD-10-CM | POA: Diagnosis present

## 2018-07-25 HISTORY — DX: Alcohol use, unspecified with withdrawal delirium: F10.931

## 2018-07-25 HISTORY — DX: Alcohol dependence with withdrawal delirium: F10.231

## 2018-07-25 LAB — CBC WITH DIFFERENTIAL/PLATELET
Abs Immature Granulocytes: 0 10*3/uL (ref 0.0–0.1)
Basophils Absolute: 0 10*3/uL (ref 0.0–0.1)
Basophils Relative: 1 %
Eosinophils Absolute: 0 10*3/uL (ref 0.0–0.7)
Eosinophils Relative: 1 %
HCT: 35.1 % — ABNORMAL LOW (ref 39.0–52.0)
Hemoglobin: 11.9 g/dL — ABNORMAL LOW (ref 13.0–17.0)
Immature Granulocytes: 0 %
Lymphocytes Relative: 7 %
Lymphs Abs: 0.3 10*3/uL — ABNORMAL LOW (ref 0.7–4.0)
MCH: 26.6 pg (ref 26.0–34.0)
MCHC: 33.9 g/dL (ref 30.0–36.0)
MCV: 78.3 fL (ref 78.0–100.0)
Monocytes Absolute: 0.5 10*3/uL (ref 0.1–1.0)
Monocytes Relative: 15 %
Neutro Abs: 2.7 10*3/uL (ref 1.7–7.7)
Neutrophils Relative %: 76 %
Platelets: 81 10*3/uL — ABNORMAL LOW (ref 150–400)
RBC: 4.48 MIL/uL (ref 4.22–5.81)
RDW: 18 % — ABNORMAL HIGH (ref 11.5–15.5)
WBC: 3.6 10*3/uL — ABNORMAL LOW (ref 4.0–10.5)

## 2018-07-25 LAB — PROTIME-INR
INR: 8.38
Prothrombin Time: 68.9 seconds — ABNORMAL HIGH (ref 11.4–15.2)

## 2018-07-25 LAB — I-STAT TROPONIN, ED: Troponin i, poc: 0 ng/mL (ref 0.00–0.08)

## 2018-07-25 LAB — URINALYSIS, ROUTINE W REFLEX MICROSCOPIC
Bacteria, UA: NONE SEEN
Bilirubin Urine: NEGATIVE
Glucose, UA: NEGATIVE mg/dL
Ketones, ur: 5 mg/dL — AB
Leukocytes, UA: NEGATIVE
Nitrite: NEGATIVE
Protein, ur: 100 mg/dL — AB
Specific Gravity, Urine: 1.025 (ref 1.005–1.030)
pH: 5 (ref 5.0–8.0)

## 2018-07-25 LAB — RAPID URINE DRUG SCREEN, HOSP PERFORMED
Amphetamines: NOT DETECTED
Barbiturates: NOT DETECTED
Benzodiazepines: POSITIVE — AB
Cocaine: NOT DETECTED
Opiates: NOT DETECTED
Tetrahydrocannabinol: NOT DETECTED

## 2018-07-25 LAB — COMPREHENSIVE METABOLIC PANEL
ALT: 79 U/L — ABNORMAL HIGH (ref 0–44)
AST: 157 U/L — ABNORMAL HIGH (ref 15–41)
Albumin: 4 g/dL (ref 3.5–5.0)
Alkaline Phosphatase: 52 U/L (ref 38–126)
Anion gap: 18 — ABNORMAL HIGH (ref 5–15)
BUN: 11 mg/dL (ref 6–20)
CO2: 26 mmol/L (ref 22–32)
Calcium: 9.6 mg/dL (ref 8.9–10.3)
Chloride: 93 mmol/L — ABNORMAL LOW (ref 98–111)
Creatinine, Ser: 1.35 mg/dL — ABNORMAL HIGH (ref 0.61–1.24)
GFR calc Af Amer: >60 mL/min (ref >60)
GFR calc non Af Amer: 56 mL/min — ABNORMAL LOW (ref >60)
Glucose, Bld: 103 mg/dL — ABNORMAL HIGH (ref 70–99)
Potassium: 3.4 mmol/L — ABNORMAL LOW (ref 3.5–5.1)
Sodium: 137 mmol/L (ref 135–145)
Total Bilirubin: 2.3 mg/dL — ABNORMAL HIGH (ref 0.3–1.2)
Total Protein: 7.1 g/dL (ref 6.5–8.1)

## 2018-07-25 LAB — CK: Total CK: 507 U/L — ABNORMAL HIGH (ref 49–397)

## 2018-07-25 LAB — TSH: TSH: 1.318 u[IU]/mL (ref 0.350–4.500)

## 2018-07-25 LAB — LIPASE, BLOOD: Lipase: 71 U/L — ABNORMAL HIGH (ref 11–51)

## 2018-07-25 MED ORDER — LORAZEPAM 2 MG/ML IJ SOLN
2.0000 mg | Freq: Once | INTRAMUSCULAR | Status: AC
  Administered 2018-07-25: 2 mg via INTRAVENOUS
  Filled 2018-07-25: qty 1

## 2018-07-25 MED ORDER — ONDANSETRON HCL 4 MG/2ML IJ SOLN: 4.0000 mg | Freq: Four times a day (QID) | INTRAMUSCULAR | Status: DC | PRN

## 2018-07-25 MED ORDER — FOLIC ACID 1 MG PO TABS
1.0000 mg | ORAL_TABLET | Freq: Every day | ORAL | Status: DC
  Administered 2018-07-26 – 2018-07-30 (×5): 1 mg via ORAL
  Filled 2018-07-25 (×5): qty 1

## 2018-07-25 MED ORDER — ONDANSETRON HCL 4 MG PO TABS: 4.0000 mg | ORAL_TABLET | Freq: Four times a day (QID) | ORAL | Status: DC | PRN

## 2018-07-25 MED ORDER — DIAZEPAM 5 MG PO TABS
5.0000 mg | ORAL_TABLET | Freq: Four times a day (QID) | ORAL | Status: DC
  Administered 2018-07-25 – 2018-07-26 (×2): 5 mg via ORAL
  Filled 2018-07-25 (×2): qty 1

## 2018-07-25 MED ORDER — METOPROLOL TARTRATE 25 MG PO TABS
25.0000 mg | ORAL_TABLET | Freq: Two times a day (BID) | ORAL | Status: DC
  Administered 2018-07-26: 25 mg via ORAL
  Filled 2018-07-25 (×2): qty 1

## 2018-07-25 MED ORDER — DOCUSATE SODIUM 100 MG PO CAPS
100.0000 mg | ORAL_CAPSULE | Freq: Two times a day (BID) | ORAL | Status: DC
  Administered 2018-07-25 – 2018-07-30 (×9): 100 mg via ORAL
  Filled 2018-07-25 (×10): qty 1

## 2018-07-25 MED ORDER — LACTATED RINGERS IV BOLUS
1000.0000 mL | Freq: Once | INTRAVENOUS | Status: AC
  Administered 2018-07-25: 1000 mL via INTRAVENOUS

## 2018-07-25 MED ORDER — NICOTINE 14 MG/24HR TD PT24
14.0000 mg | MEDICATED_PATCH | Freq: Every day | TRANSDERMAL | Status: DC
  Administered 2018-07-26 – 2018-07-30 (×5): 14 mg via TRANSDERMAL
  Filled 2018-07-25 (×6): qty 1

## 2018-07-25 MED ORDER — SODIUM CHLORIDE 0.9 % IV BOLUS
1000.0000 mL | Freq: Once | INTRAVENOUS | Status: AC
  Administered 2018-07-25: 1000 mL via INTRAVENOUS

## 2018-07-25 MED ORDER — LACTATED RINGERS IV SOLN
INTRAVENOUS | Status: DC
  Administered 2018-07-26: 06:00:00 via INTRAVENOUS

## 2018-07-25 MED ORDER — OXYCODONE HCL 5 MG PO TABS
5.0000 mg | ORAL_TABLET | ORAL | Status: DC | PRN
  Administered 2018-07-28 – 2018-07-29 (×2): 5 mg via ORAL
  Filled 2018-07-25 (×2): qty 1

## 2018-07-25 MED ORDER — VITAMIN B-1 100 MG PO TABS
100.0000 mg | ORAL_TABLET | Freq: Every day | ORAL | Status: DC
  Administered 2018-07-26 – 2018-07-30 (×5): 100 mg via ORAL
  Filled 2018-07-25 (×5): qty 1

## 2018-07-25 MED ORDER — SODIUM CHLORIDE 0.9% FLUSH
3.0000 mL | Freq: Two times a day (BID) | INTRAVENOUS | Status: DC
  Administered 2018-07-25 – 2018-07-30 (×6): 3 mL via INTRAVENOUS

## 2018-07-25 MED ORDER — POTASSIUM CHLORIDE CRYS ER 20 MEQ PO TBCR
40.0000 meq | EXTENDED_RELEASE_TABLET | Freq: Once | ORAL | Status: AC
  Administered 2018-07-25: 40 meq via ORAL
  Filled 2018-07-25: qty 2

## 2018-07-25 MED ORDER — DIAZEPAM 5 MG PO TABS
10.0000 mg | ORAL_TABLET | Freq: Once | ORAL | Status: AC
  Administered 2018-07-25: 10 mg via ORAL
  Filled 2018-07-25: qty 2

## 2018-07-25 MED ORDER — LORAZEPAM 2 MG/ML IJ SOLN
2.0000 mg | INTRAMUSCULAR | Status: DC | PRN
  Administered 2018-07-25 – 2018-07-26 (×8): 2 mg via INTRAVENOUS
  Filled 2018-07-25 (×9): qty 1

## 2018-07-25 MED ORDER — ADULT MULTIVITAMIN W/MINERALS CH
1.0000 | ORAL_TABLET | Freq: Every day | ORAL | Status: DC
  Administered 2018-07-26 – 2018-07-30 (×5): 1 via ORAL
  Filled 2018-07-25 (×5): qty 1

## 2018-07-25 MED ORDER — THIAMINE HCL 100 MG/ML IJ SOLN
Freq: Once | INTRAVENOUS | Status: AC
  Administered 2018-07-25: 18:00:00 via INTRAVENOUS
  Filled 2018-07-25: qty 1000
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
## 2018-07-25 NOTE — ED Notes (Addendum)
Pt ambulated to bathroom with some assistance to give urine sample. Told pt to pull call light when completed to help him back to his room.

## 2018-07-25 NOTE — Progress Notes (Signed)
Pt arrived to [facility]. Pt identified appropriately. Alert and oriented x 4, VS stable, HR elevated to 120s but not sustained. Pt placed on progressive care monitor, CCMD notified and verified. Pt very agitated, tremor present in entire body. Ativan given as ordered.  Pt oriented to room and equipment, advised about valuable policy, instructed to use call bell for assistance and call bell left with in reach.  Due to agitation and anxiety, pt placed on low bed with floor mats on both sides of the bed. Will continue to monitor and treat pt.

## 2018-07-25 NOTE — ED Notes (Signed)
Admitting MD is here to see pt 

## 2018-07-25 NOTE — ED Notes (Addendum)
Pt aware he needs to give urine

## 2018-07-25 NOTE — ED Notes (Signed)
EMT Ericson Nafziger noted that pt was not shaky nor off balance when walking to the bathroom. Felt comfortable walking with assistance, checked on him in the bathroom and he said he was fine he was just going to the bathroom still.

## 2018-07-25 NOTE — Significant Event (Addendum)
Rapid Response Event Note  Overview: Other - CIWA  Initial Focused Assessment: Called by RN about patient having a CIWA score of 18. Since arrival at the hospital, after reviewing the record, patient has received a total of 15mg  Valium and 14mg  of Ativan. Patient is alert, oriented 2-3, confused at times about the situation. I remember him from another admission (for DTs), he is not combative, but is hallucinating visual and auditory, significant tremors, and is anxious. RN just administered 2 mg Ativan IV. Previously patient has required Precedex infusion in the ICU. VS: HR low 100s, not in acute respiratory distress, saturations are maintaining > 98% RA.  Interventions: - None  Plan of Care: - Monitor CIWA every hour and treat as needed.  - I did paged [place] NP, updated her on patient's condition  Event Summary:  Call Time 2249 Arrival Time 2305 End Time 2325  Sp[place]er Cong R

## 2018-07-25 NOTE — ED Triage Notes (Signed)
Pt reports substernal CP and SOB off and on for the last several months. Patient reports the pain is muscular, does not radiate. Pt c/o generalized weakness. Denies recent fever/cough. Reports recent heart valve repair. Reports stopped drinking alcohol 2 weeks ago, was drinking approx 1 bottle of wine/[place].  Pt alert, oriented x4, shaking noted in upper extremities. Lungs CTA, CBG 104. 18g LAC, on coumadin for hx of PE.

## 2018-07-25 NOTE — ED Provider Notes (Signed)
[facility] EMERGENCY DEPARTMENT Provider Note   CSN: [id number] Arrival date & time: 07/25/18  1136     History   Chief Complaint Chief Complaint  Patient presents with  . Chest Pain    HPI Leonard Bentley is a 57 y.o. male.  HPI  57 year old male with a history of PE currently on warfarin, non-Hodgkin lymphoma, atrial tachycardia presents with increased heart rate.  He is been feeling his heart racing for at least a couple weeks.  Significantly worse over the last few days including especially today.  He does have some shortness of breath whenever his heart seems to race.  However he denies chest pain.  He is noted to have a tremor which he states is due to being anxious being in the ED.  He typically drinks 1 wine bottle per [place] but has not had any alcohol in about 2 weeks.  He denies any focal weakness but has been having some dizziness.  However no headache.  Some on and off blurry vision.  He states he had some abdominal pain over the last 2 days that is periumbilical and seems to wax and wane.  Currently has no abdominal pain.  Vomited once a couple days ago but none since.  Past Medical History:  Diagnosis Date  . Acute pulmonary embolism ([place]) 04/11/2016  . Anxiety   . Arthritis   . Atrial tachycardia ([place]) 01/06/2016  . Depression   . DTs (delirium tremens) ([place])   . Dyspnea   . ED (erectile dysfunction)   . Hypertension   . Lymphoma, non Hodgkin's 09/25/2011   Stage 2  . Mitral regurgitation 01/20/2016  . Mobitz I 10/09/2015  . Murmur 01/06/2016  . Occasional tremors   . PAC (premature atrial contraction) 10/09/2015  . S/P minimally-invasive mitral valve repair 10/27/2016   Complex valvuloplasty including artificial Gore-tex neochord placement x6 and 34 mm Edwards Physio II ring annuloplasty via right mini thoracotomy approach    Patient Active Problem List   Diagnosis Date Noted  . Recurrent pulmonary emboli ([place])   . Hematuria   . Prolonged Q-T  interval on ECG   . Delirium tremens ([place])   . LFTs abnormal   . Alcoholic hepatitis without ascites   . Alcohol withdrawal ([place]) 06/04/2018  . Dizziness   . Posterior tibial tendon dysfunction 02/21/2018  . Elevated INR 11/27/2016  . Dehydration 11/27/2016  . SVT (supraventricular tachycardia) ([place])   . S/P minimally-invasive mitral valve repair 10/27/2016  . History of pulmonary embolus (PE) 04/11/2016  . Left sided numbness   . Anxiety 04/02/2016  . Panic attacks 04/02/2016  . Mitral valve prolapse   . Mitral regurgitation 01/20/2016  . Atrial tachycardia ([place]) 01/06/2016  . Murmur 01/06/2016  . Mobitz I 10/09/2015  . PAC (premature atrial contraction) 10/09/2015  . Arthritis 08/14/2015  . Neuropathy due to chemotherapeutic drug ([place]) 03/09/2013  . Leucopenia 01/31/2013  . Personal history of pulmonary embolism 12/28/2012  . Alcohol abuse 08/24/2012  . Cigarette smoker 08/24/2012  . Hemorrhoid 03/08/2012  . Anemia 12/10/2011  . History of non-Hodgkin's lymphoma 09/25/2011    Past Surgical History:  Procedure Laterality Date  . CARDIAC CATHETERIZATION N/A 08/13/2016   Procedure: Right/Left Heart Cath and Coronary Angiography;  Surgeon: [name], MD;  Location: [facility];  Service: Cardiovascular;  Laterality: N/A;  . MITRAL VALVE REPAIR Right 10/27/2016   Procedure: MINIMALLY INVASIVE MITRAL VALVE REPAIR (MVR) USING 34 PHYSIO II ANNULOPLASTY RING;  Surgeon: [name], MD;  Location: [facility];  Service: Open Heart Surgery;  Laterality: Right;  . NO PAST SURGERIES    . SKIN BIOPSY Right 04/04/2018   right mid medial anterior tibial shave  see report in chart  . TEE WITHOUT CARDIOVERSION N/A 02/11/2016   Procedure: TRANSESOPHAGEAL ECHOCARDIOGRAM (TEE);  Surgeon: [name], MD;  Location: [facility];  Service: Cardiovascular;  Laterality: N/A;  . TEE WITHOUT CARDIOVERSION N/A 10/27/2016   Procedure: TRANSESOPHAGEAL ECHOCARDIOGRAM (TEE);  Surgeon:  [name], MD;  Location: [facility];  Service: Open Heart Surgery;  Laterality: N/A;  . TRIGGER FINGER RELEASE Bilateral         Home Medications    Prior to Admission medications   Medication Sig Start Date End Date Taking? Authorizing Provider  metoprolol tartrate (LOPRESSOR) 25 MG tablet Take 1 tablet (25 mg total) by mouth 2 (two) times daily. 06/12/18  Yes Danford, Suann Larry, MD  tetrahydrozoline (REDNESS RELIEVER EYE DROPS) 0.05 % ophthalmic solution Place 1 drop into both eyes daily as needed (red eyes).   Yes [provider]  triamcinolone cream (KENALOG) 0.1 % Apply 1 application topically 2 (two) times daily. 02/13/18  Yes Tysinger, Camelia Eng, PA-C  warfarin (COUMADIN) 7.5 MG tablet Take 5 mg (one tablet) once daily except Mondays take 7.5 mg (1 1/2 tabs). Patient taking differently: Take 5 mg by mouth one time only at 6 PM.  06/12/18  Yes Danford, Suann Larry, MD  diclofenac sodium (VOLTAREN) 1 % GEL Apply two grams. Use four times daily for pain Patient not taking: Reported on 07/25/2018 02/21/18   Aundra Dubin, PA-C  gabapentin (NEURONTIN) 300 MG capsule Take 1 capsule (300 mg total) by mouth at bedtime. Patient not taking: Reported on 07/25/2018 06/12/18   Edwin Dada, MD    Family History Family History  Problem Relation Age of Onset  . Cancer Mother        BREAST(BONE)  . Cancer Father        PANCREATIC  . Hypertension Maternal Grandmother   . Stroke Maternal Aunt   . Heart attack Neg Hx     Social History Social History   Tobacco Use  . Smoking status: Current Every [place] Smoker    Packs/[place]: 1.00    Years: 40.00    Pack years: 40.00    Types: Cigarettes, E-cigarettes  . Smokeless tobacco: Former Systems developer    Quit date: 1985  . Tobacco comment: vapes all [place]  Substance Use Topics  . Alcohol use: Yes    Alcohol/week: 0.0 standard drinks    Comment: 1-2 bottles of red wine a [place]  . Drug use: No     Allergies   Patient has no known  allergies.   Review of Systems Review of Systems  Constitutional: Negative for fever.  Respiratory: Positive for shortness of breath. Negative for cough.   Cardiovascular: Positive for palpitations. Negative for chest pain.  Gastrointestinal: Positive for abdominal pain and vomiting. Negative for diarrhea.  Genitourinary: Negative for dysuria.  Musculoskeletal: Negative for back pain.  Neurological: Positive for dizziness and tremors.  All other systems reviewed and are negative.    Physical Exam Updated Vital Signs BP 96/82   Pulse (!) 135   Temp (!) 97.5 F (36.4 C) (Oral)   Resp (!) 21   Ht 5\' 9"  (1.753 m)   Wt 93 kg   SpO2 100%   BMI 30.27 kg/m   Physical Exam  Constitutional: He  is oriented to person, place, and time. He appears well-developed and well-nourished.  HENT:  Head: Normocephalic and atraumatic.  Right Ear: External ear normal.  Left Ear: External ear normal.  Nose: Nose normal.  Eyes: Pupils are equal, round, and reactive to light. EOM are normal. Right eye exhibits no discharge. Left eye exhibits no discharge.  Neck: Neck supple.  Cardiovascular: Regular rhythm and normal heart sounds. Tachycardia present.  Pulmonary/Chest: Effort normal and breath sounds normal.  Abdominal: Soft. There is no tenderness.  Musculoskeletal: He exhibits no edema.  Neurological: He is alert and oriented to person, place, and time.  CN 3-12 grossly intact. 5/5 strength in all 4 extremities. Grossly normal sensation. Normal finger to nose. Significant tremor with movement, mild tremor at rest in BUE  Skin: Skin is warm and dry.  Nursing note and vitals reviewed.    ED Treatments / Results  Labs (all labs ordered are listed, but only abnormal results are displayed) Labs Reviewed  COMPREHENSIVE METABOLIC PANEL - Abnormal; Notable for the following components:      Result Value   Potassium 3.4 (*)    Chloride 93 (*)    Glucose, Bld 103 (*)    Creatinine, Ser 1.35 (*)     AST 157 (*)    ALT 79 (*)    Total Bilirubin 2.3 (*)    GFR calc non Af Amer 56 (*)    Anion gap 18 (*)    All other components within normal limits  LIPASE, BLOOD - Abnormal; Notable for the following components:   Lipase 71 (*)    All other components within normal limits  CBC WITH DIFFERENTIAL/PLATELET - Abnormal; Notable for the following components:   WBC 3.6 (*)    Hemoglobin 11.9 (*)    HCT 35.1 (*)    RDW 18.0 (*)    Platelets 81 (*)    Lymphs Abs 0.3 (*)    All other components within normal limits  PROTIME-INR - Abnormal; Notable for the following components:   Prothrombin Time 68.9 (*)    INR 8.38 (*)    All other components within normal limits  CK - Abnormal; Notable for the following components:   Total CK 507 (*)    All other components within normal limits  URINALYSIS, ROUTINE W REFLEX MICROSCOPIC  RAPID URINE DRUG SCREEN, HOSP PERFORMED  I-STAT TROPONIN, ED    EKG EKG Interpretation  Date/Time:  Tuesday July 25 2018 11:51:20 EDT Ventricular Rate:  137 PR Interval:    QRS Duration: 86 QT Interval:  327 QTC Calculation: 494 R Axis:   -39 Text Interpretation:  Sinus tachycardia Atrial premature complexes Left axis deviation Borderline prolonged QT interval Artifact rate increased compared to July 2019 Confirmed by Sherwood Gambler [phone]) on 07/25/2018 11:57:43 AM   Radiology Dg Chest 2 View  Result Date: 07/25/2018 CLINICAL DATA:  57 year old male with shortness of breath, palpitations. EXAM: CHEST - 2 VIEW COMPARISON:  CTA chest 06/05/2018 and earlier. FINDINGS: Stable cardiac and mediastinal contours with prostatic mitral valve. Lung volumes are within normal limits. Visualized tracheal air column is within normal limits. No pneumothorax, pleural effusion or consolidation. Chronic pulmonary reticulonodular opacity, greater in the right lung, stable since 2017. No acute or confluent pulmonary opacity. Negative visible bowel gas pattern. No acute  osseous abnormality identified. IMPRESSION: No acute findings. Chronic small miliary lung nodules, more apparent in the right lung. Electronically Signed   By: Genevie Ann M.D.   On: 07/25/2018 13:04  Procedures .Critical Care Performed by: Sherwood Gambler, MD Authorized by: Sherwood Gambler, MD   Critical care provider statement:    Critical care time (minutes):  30   Critical care was necessary to treat or prevent imminent or life-threatening deterioration of the following conditions:  Metabolic crisis and CNS failure or compromise   Critical care was time spent personally by me on the following activities:  Development of treatment plan with patient or surrogate, discussions with consultants, evaluation of patient's response to treatment, examination of patient, obtaining history from patient or surrogate, ordering and performing treatments and interventions, ordering and review of laboratory studies, ordering and review of radiographic studies, pulse oximetry, re-evaluation of patient's condition and review of old charts   (including critical care time)  Medications Ordered in ED Medications  lactated ringers bolus 1,000 mL (1,000 mLs Intravenous New Bag/Given 07/25/18 1539)  sodium chloride 0.9 % bolus 1,000 mL (0 mLs Intravenous Stopped 07/25/18 1534)  LORazepam (ATIVAN) injection 2 mg (2 mg Intravenous Given 07/25/18 1234)  potassium chloride SA (K-DUR,KLOR-CON) CR tablet 40 mEq (40 mEq Oral Given 07/25/18 1355)  LORazepam (ATIVAN) injection 2 mg (2 mg Intravenous Given 07/25/18 1406)  diazepam (VALIUM) tablet 10 mg (10 mg Oral Given 07/25/18 1548)     Initial Impression / Assessment and Plan / ED Course  I have reviewed the triage vital signs and the nursing notes.  Pertinent labs & imaging results that were available during my care of the patient were reviewed by me and considered in my medical decision making (see chart for details).     Patient later confirms/admits to recent  alcohol use only 2 days ago.  This makes more sense concerning his acute alcohol withdrawal presentation.  Initially quite hypertensive with tachycardia and the tremors.  Was given to IV Ativan twice with significant improvement however he is still quite tachycardic.  Appears to have a mild acute kidney injury as well.  Potassium repleted.  He does not appear to have an acute infection.  He is noted to have a supratherapeutic INR but no headache or focal neuro deficits.  I do not think CT head needed.  Abdominal exam is benign and he continues to have no abdominal pain on repeat exam.  At this point, he has improved but still needs further management of his acute alcohol withdrawal syndrome.  Dr. Lorin Mercy to admit.  Final Clinical Impressions(s) / ED Diagnoses   Final diagnoses:  Alcohol withdrawal syndrome without complication ([place])  Supratherapeutic INR    ED Discharge Orders    None       Sherwood Gambler, MD 07/25/18 1558

## 2018-07-25 NOTE — H&P (Signed)
History and Physical    Leonard Bentley NKN:[id number] DOB: 11-04-1961 DOA: 07/25/2018  PCP: Argentina Donovan, PA-C Consultants:  Oval Linsey - cardiology; [place] - hand surgery Patient coming from:  Home - lives alone; [place]:  Brother, [phone]  Chief Complaint: chest pain  HPI: Leonard Bentley is a 57 y.o. male with medical history significant of MVP; NHL; HTN; depression; ETOH dependence; chronic combined heart failure; and PE on Coumadin presenting with chest pain.  He started his AM really good but he was having difficulty standing up by the time he got out of the shower due to weakness in his legs and lack of balance.  Slight pinpoint chest pain.  +SOB, even while trying to get dressed.  He has been feeling anxious/nervous.  +tremors when he gets aggravated.  He endorses visual hallucinations.  His last drink was 3-4 days ago.  He wants to quit drinking.  He was admitted from 7/28-8/5 for DTs from ETOH withdrawal; he required Precedex infusion in the ICU at that time.  ED Course:  ETOH withdrawal.  Complains of heart racing - has happened before.  HTN, tachycardia, tremors.  Reports no drinking x 2 weeks - but then says 2 days ago.  Initial CIWA 14.  Now less tremulous, still with tachycardia.  Mild lab abnormalities.  INR is 8, no headache, transient abdominal pain (resolved), was not scanned.  Review of Systems: As per HPI; otherwise review of systems reviewed and negative.   Ambulatory Status:  Ambulates without assistance  Past Medical History:  Diagnosis Date  . Acute pulmonary embolism ([place]) 04/11/2016  . Anxiety   . Arthritis   . Atrial tachycardia ([place]) 01/06/2016  . Depression   . DTs (delirium tremens) ([place])   . Dyspnea   . ED (erectile dysfunction)   . Hypertension   . Lymphoma, non Hodgkin's 09/25/2011   Stage 2  . Mitral regurgitation 01/20/2016  . Mobitz I 10/09/2015  . Murmur 01/06/2016  . Occasional tremors   . PAC (premature atrial contraction) 10/09/2015  . S/P  minimally-invasive mitral valve repair 10/27/2016   Complex valvuloplasty including artificial Gore-tex neochord placement x6 and 34 mm Edwards Physio II ring annuloplasty via right mini thoracotomy approach    Past Surgical History:  Procedure Laterality Date  . CARDIAC CATHETERIZATION N/A 08/13/2016   Procedure: Right/Left Heart Cath and Coronary Angiography;  Surgeon: [name], MD;  Location: [facility];  Service: Cardiovascular;  Laterality: N/A;  . MITRAL VALVE REPAIR Right 10/27/2016   Procedure: MINIMALLY INVASIVE MITRAL VALVE REPAIR (MVR) USING 34 PHYSIO II ANNULOPLASTY RING;  Surgeon: [name], MD;  Location: [facility];  Service: Open Heart Surgery;  Laterality: Right;  . NO PAST SURGERIES    . SKIN BIOPSY Right 04/04/2018   right mid medial anterior tibial shave  see report in chart  . TEE WITHOUT CARDIOVERSION N/A 02/11/2016   Procedure: TRANSESOPHAGEAL ECHOCARDIOGRAM (TEE);  Surgeon: [name], MD;  Location: [facility];  Service: Cardiovascular;  Laterality: N/A;  . TEE WITHOUT CARDIOVERSION N/A 10/27/2016   Procedure: TRANSESOPHAGEAL ECHOCARDIOGRAM (TEE);  Surgeon: [name], MD;  Location: [facility];  Service: Open Heart Surgery;  Laterality: N/A;  . TRIGGER FINGER RELEASE Bilateral     Social History   Socioeconomic History  . Marital status: Single    Spouse name: Not on file  . Number of children: Not on file  . Years of education: Not on file  . Highest education level: Not  on file  Occupational History  . Occupation: unemployed  Social Needs  . Financial resource strain: Not on file  . Food insecurity:    Worry: Not on file    Inability: Not on file  . Transportation needs:    Medical: Not on file    Non-medical: Not on file  Tobacco Use  . Smoking status: Current Every Lesch Smoker    Packs/Deutschman: 1.00    Years: 40.00    Pack years: 40.00    Types: Cigarettes, E-cigarettes  . Smokeless tobacco: Former Systems developer    Quit date: 1985    . Tobacco comment: vapes all Dauphine  Substance and Sexual Activity  . Alcohol use: Yes    Alcohol/week: 0.0 standard drinks    Comment: 1-2 bottles of red wine a [place]  . Drug use: No  . Sexual activity: Never  Lifestyle  . Physical activity:    Days per week: Not on file    Minutes per session: Not on file  . Stress: Not on file  Relationships  . Social connections:    Talks on phone: Not on file    Gets together: Not on file    Attends religious service: Not on file    Active member of club or organization: Not on file    Attends meetings of clubs or organizations: Not on file    Relationship status: Not on file  . Intimate partner violence:    Fear of current or ex partner: Not on file    Emotionally abused: Not on file    Physically abused: Not on file    Forced sexual activity: Not on file  Other Topics Concern  . Not on file  Social History Narrative   Epworth sleepiness scale as of 10/09/15 a 1    No Known Allergies  Family History  Problem Relation Age of Onset  . Cancer Mother        BREAST(BONE)  . Cancer Father        PANCREATIC  . Hypertension Maternal Grandmother   . Stroke Maternal Aunt   . Heart attack Neg Hx     Prior to Admission medications   Medication Sig Start Date End Date Taking? Authorizing Provider  metoprolol tartrate (LOPRESSOR) 25 MG tablet Take 1 tablet (25 mg total) by mouth 2 (two) times daily. 06/12/18  Yes Danford, Suann Larry, MD  tetrahydrozoline (REDNESS RELIEVER EYE DROPS) 0.05 % ophthalmic solution Place 1 drop into both eyes daily as needed (red eyes).   Yes [provider]  triamcinolone cream (KENALOG) 0.1 % Apply 1 application topically 2 (two) times daily. 02/13/18  Yes Tysinger, Camelia Eng, PA-C  warfarin (COUMADIN) 7.5 MG tablet Take 5 mg (one tablet) once daily except Mondays take 7.5 mg (1 1/2 tabs). Patient taking differently: Take 5 mg by mouth one time only at 6 PM.  06/12/18  Yes Danford, Suann Larry, MD  diclofenac  sodium (VOLTAREN) 1 % GEL Apply two grams. Use four times daily for pain Patient not taking: Reported on 07/25/2018 02/21/18   Aundra Dubin, PA-C  gabapentin (NEURONTIN) 300 MG capsule Take 1 capsule (300 mg total) by mouth at bedtime. Patient not taking: Reported on 07/25/2018 06/12/18   Edwin Dada, MD    Physical Exam: Vitals:   07/25/18 1301 07/25/18 1333 07/25/18 1534 07/25/18 1537  BP: 101/82 95/67 96/82    Pulse:   (!) 133 (!) 135  Resp: (!) 31 (!) 26 (!) 21   Temp:   Marland Kitchen)  97.5 F (36.4 C)   TempSrc:   Oral   SpO2:   100%   Weight:      Height:         General:  Appears disheveled, anxious, agitated Eyes:  PERRL, EOMI, normal lids, iris ENT:  grossly normal hearing, lips & tongue, mmm; appropriate dentition Neck:  no LAD, masses or thyromegaly Cardiovascular:  Significant persistent tachycardia, no m/r/g. No LE edema.  Respiratory:   CTA bilaterally with no wheezes/rales/rhonchi.  Normal to mildly increased respiratory effort. Abdomen:  soft, NT other than mild TTP in RUQ, ND, NABS Back:   normal alignment, no CVAT Skin:  no rash or induration seen on limited exam Musculoskeletal:  grossly normal tone BUE/BLE, good ROM, no bony abnormality Psychiatric: anxious mood and affect, speech fluent and appropriate, AOx3, very tremulous, occasionally responding to internal stimuli (saw his legs and thought they belonged to someone else, and was agitated that there was another person in the room) Neurologic:  CN 2-12 grossly intact, moves all extremities in coordinated fashion, sensation intact    Radiological Exams on Admission: Dg Chest 2 View  Result Date: 07/25/2018 CLINICAL DATA:  57 year old male with shortness of breath, palpitations. EXAM: CHEST - 2 VIEW COMPARISON:  CTA chest 06/05/2018 and earlier. FINDINGS: Stable cardiac and mediastinal contours with prostatic mitral valve. Lung volumes are within normal limits. Visualized tracheal air column is within normal  limits. No pneumothorax, pleural effusion or consolidation. Chronic pulmonary reticulonodular opacity, greater in the right lung, stable since 2017. No acute or confluent pulmonary opacity. Negative visible bowel gas pattern. No acute osseous abnormality identified. IMPRESSION: No acute findings. Chronic small miliary lung nodules, more apparent in the right lung. Electronically Signed   By: Genevie Ann M.D.   On: 07/25/2018 13:04    EKG: Independently reviewed.  NSR with rate 137;rate related changes with no evidence of acute ischemia   Labs on Admission: I have personally reviewed the available labs and imaging studies at the time of the admission.  Pertinent labs:   BUN 11/Creatinine 1.35/GFR 56; 8/0.85/>60 on 8/5 Anion gap 18 Lipase 71 AST 157/ALT 79/Bili 2.3 CK 507 Troponin 0.00 WBC 3.6 Hgb 11.9 Platelets 81 INR 8.38; 1.98 on 8/5   Assessment/Plan Principal Problem:   Delirium tremens (HCC) Active Problems:   Cigarette smoker   History of pulmonary embolus (PE)   Elevated INR   Alcoholic hepatitis without ascites   Prolonged Q-T interval on ECG   Delirium tremens -Patient with h/o DTs on prior admission in late July -He stopped drinking 2-3 days ago and appears to be in frank DTs at this time -He has tachycardia, tremors, and hallucinations -Will observe in SDU for now -Will give a 1-time dose of Valium 10 mg PO -I have spoken with PCCM and if the Valium does not curtail his symptoms, he appears likely to need transfer to the ICU with Precedex infusion (note: he DID require this during his last admission) -CIWA protocol with PRN BZD as well as standing PO Valium for now -He seems to have a high risk for needing Precedex to help him get through DTs -Folate and thiamine ordered -Patient reports desire to quit.  Will likely need long-term treatment program.  I have provided him with the contact information for [facility] [phone]) and (with his  permission) have notified them that he might be in touch. -SW consult requested. -Will also check UDS.  Alcoholic hepatitis -Mild alcoholic hepatitis -Patient with  chronic ETOH dependence -Thrombocytopenia due to bone marrow suppression from alcohol abuse  Elevated INR with h/o PE, on Coumadin -He has had more than 1 prior clot and so should be on lifelong anticoagulation -That said, if he continues to drink ETOH, with his current baseline liver dysfunction, Coumadin is a problem in and of itself -For now, his INR is quite elevated but he is not actively bleeding -Will hold Coumadin without additional therapy and trend INR  Borderline prolonged QT on EKG -It is not high enough at this time to justify holding medications that can prolong the QT interval -Will repeat tomorrow  Tobacco dependence -Encourage cessation.  This was discussed with the patient and should be reviewed on an ongoing basis.   -Patch ordered at patient request.    DVT prophylaxis: Supratherapeutic INR - not needed at this time Code Status:  Full - confirmed with patient Family Communication: None present  Disposition Plan:  To be determined Consults called: Social work; Education officer, museum by telephone only  Admission status: It is my clinical opinion that referral for OBSERVATION is reasonable and necessary in this patient based on the above information provided. The aforementioned taken together are felt to place the patient at high risk for further clinical deterioration. However it is anticipated that the patient may be medically stable for discharge from the hospital within 24 to 48 hours.    [name] MD [facility]  If note is complete, please contact covering daytime or nighttime physician. [email or web address] Password [email or web address]  07/25/2018, 4:02 PM

## 2018-07-25 NOTE — ED Notes (Signed)
Pt unable to give urine sample at this time 

## 2018-07-26 ENCOUNTER — Other Ambulatory Visit: Payer: Self-pay

## 2018-07-26 LAB — CBC
HCT: 32.2 % — ABNORMAL LOW (ref 39.0–52.0)
Hemoglobin: 10.6 g/dL — ABNORMAL LOW (ref 13.0–17.0)
MCH: 26 pg (ref 26.0–34.0)
MCHC: 32.9 g/dL (ref 30.0–36.0)
MCV: 79.1 fL (ref 78.0–100.0)
Platelets: 67 10*3/uL — ABNORMAL LOW (ref 150–400)
RBC: 4.07 MIL/uL — ABNORMAL LOW (ref 4.22–5.81)
RDW: 17.7 % — ABNORMAL HIGH (ref 11.5–15.5)
WBC: 2.4 10*3/uL — ABNORMAL LOW (ref 4.0–10.5)

## 2018-07-26 LAB — COMPREHENSIVE METABOLIC PANEL
ALT: 65 U/L — ABNORMAL HIGH (ref 0–44)
AST: 109 U/L — ABNORMAL HIGH (ref 15–41)
Albumin: 3.2 g/dL — ABNORMAL LOW (ref 3.5–5.0)
Alkaline Phosphatase: 45 U/L (ref 38–126)
Anion gap: 11 (ref 5–15)
BUN: 10 mg/dL (ref 6–20)
CO2: 26 mmol/L (ref 22–32)
Calcium: 8.3 mg/dL — ABNORMAL LOW (ref 8.9–10.3)
Chloride: 101 mmol/L (ref 98–111)
Creatinine, Ser: 0.95 mg/dL (ref 0.61–1.24)
GFR calc Af Amer: >60 mL/min (ref >60)
GFR calc non Af Amer: >60 mL/min (ref >60)
Glucose, Bld: 85 mg/dL (ref 70–99)
Potassium: 3.4 mmol/L — ABNORMAL LOW (ref 3.5–5.1)
Sodium: 138 mmol/L (ref 135–145)
Total Bilirubin: 1.7 mg/dL — ABNORMAL HIGH (ref 0.3–1.2)
Total Protein: 5.7 g/dL — ABNORMAL LOW (ref 6.5–8.1)

## 2018-07-26 LAB — PROTIME-INR
INR: 6.51
Prothrombin Time: 56.6 seconds — ABNORMAL HIGH (ref 11.4–15.2)

## 2018-07-26 LAB — MRSA PCR SCREENING: MRSA by PCR: NEGATIVE

## 2018-07-26 MED ORDER — VITAMIN K1 10 MG/ML IJ SOLN: 1.0000 mg | Freq: Once | INTRAVENOUS | Status: DC

## 2018-07-26 MED ORDER — LACTATED RINGERS IV SOLN: INTRAVENOUS | Status: DC

## 2018-07-26 MED ORDER — CLONIDINE HCL 0.1 MG PO TABS
0.1000 mg | ORAL_TABLET | Freq: Two times a day (BID) | ORAL | Status: DC
  Administered 2018-07-26 – 2018-07-27 (×2): 0.1 mg via ORAL
  Filled 2018-07-26 (×2): qty 1

## 2018-07-26 MED ORDER — HALOPERIDOL LACTATE 5 MG/ML IJ SOLN
5.0000 mg | Freq: Once | INTRAMUSCULAR | Status: AC
  Administered 2018-07-26: 5 mg via INTRAVENOUS
  Filled 2018-07-26: qty 1

## 2018-07-26 MED ORDER — CLONIDINE HCL 0.1 MG PO TABS: 0.1000 mg | ORAL_TABLET | Freq: Three times a day (TID) | ORAL | Status: DC

## 2018-07-26 MED ORDER — CHLORDIAZEPOXIDE HCL 5 MG PO CAPS
15.0000 mg | ORAL_CAPSULE | Freq: Three times a day (TID) | ORAL | Status: DC
  Administered 2018-07-26 – 2018-07-28 (×7): 15 mg via ORAL
  Filled 2018-07-26 (×7): qty 3

## 2018-07-26 MED ORDER — CLONIDINE HCL 0.2 MG PO TABS: 0.2000 mg | ORAL_TABLET | Freq: Three times a day (TID) | ORAL | Status: DC

## 2018-07-26 MED ORDER — SODIUM CHLORIDE 0.9 % IV BOLUS
1000.0000 mL | Freq: Once | INTRAVENOUS | Status: AC
  Administered 2018-07-26: 1000 mL via INTRAVENOUS

## 2018-07-26 MED ORDER — LACTATED RINGERS IV SOLN
INTRAVENOUS | Status: DC
  Administered 2018-07-26 – 2018-07-27 (×2): via INTRAVENOUS
  Administered 2018-07-27: 1000 mL via INTRAVENOUS

## 2018-07-26 MED ORDER — WARFARIN - PHARMACIST DOSING INPATIENT
Freq: Every day | Status: DC
  Administered 2018-07-29: 18:00:00

## 2018-07-26 NOTE — Progress Notes (Signed)
ANTICOAGULATION CONSULT NOTE - Initial Consult  Pharmacy Consult for Coumadin Indication: hx recurrent PE  No Known Allergies  Patient Measurements: Height: 5\' 9"  (175.3 cm) Weight: 205 lb (93 kg) IBW/kg (Calculated) : 70.7  Vital Signs: Temp: 97.5 F (36.4 C) (09/18 0858) Temp Source: Oral (09/18 0858) BP: 85/70 (09/18 1430) Pulse Rate: 126 (09/18 1430)  Labs: Recent Labs    07/25/18 1230 07/25/18 1343 07/26/18 0652  HGB 11.9*  --  10.6*  HCT 35.1*  --  32.2*  PLT 81*  --  67*  LABPROT 68.9*  --  56.6*  INR 8.38*  --  6.51*  CREATININE 1.35*  --  0.95  CKTOTAL  --  507*  --     Estimated Creatinine Clearance: 95.4 mL/min (by C-G formula based on SCr of 0.95 mg/dL).   Medical History: Past Medical History:  Diagnosis Date  . Acute pulmonary embolism ([place]) 04/11/2016  . Anxiety   . Arthritis   . Atrial tachycardia ([place]) 01/06/2016  . Depression   . DTs (delirium tremens) ([place])   . Dyspnea   . ED (erectile dysfunction)   . Hypertension   . Lymphoma, non Hodgkin's 09/25/2011   Stage 2  . Mitral regurgitation 01/20/2016  . Mobitz I 10/09/2015  . Murmur 01/06/2016  . Occasional tremors   . PAC (premature atrial contraction) 10/09/2015  . S/P minimally-invasive mitral valve repair 10/27/2016   Complex valvuloplasty including artificial Gore-tex neochord placement x6 and 34 mm Edwards Physio II ring annuloplasty via right mini thoracotomy approach   Assessment:  57 yr old male on Coumadin prior to admission for recurrent PE.  Admitted 9/17 with chest pain.  INR 8.38 on admit, down to 6.51 today. Hx ETOH and trying to quit. Platelet count low as noted. No bleeding noted.   Home Coumadin regimen:  5 mg daily except 7.5 mg on Mondays.   Last dose 9/16 (Monday)  Goal of Therapy:  INR 2-3 Monitor platelets by anticoagulation protocol: Yes   Plan:   No Coumadin again today.  Daily PT/INR.  Confirmed home Coumadin regimen with patient.  Last filled with 5 mg tablets  on 04/14/18. He reports that he only was 6 or 7 tablets left and will need a new prescription at discharge.  Arty Baumgartner, [place] Pager: [phone] or phone: [phone] 07/26/2018,3:57 PM

## 2018-07-26 NOTE — Discharge Instructions (Addendum)
Follow with Primary MD Argentina Donovan, PA-C in 3 days   Get CBC, CMP, Magnesium, INR checked  by Primary MD in 3 days   Activity: As tolerated with Full fall precautions use walker/cane & assistance as needed  Disposition Home    Diet: Heart Healthy    For Heart failure patients - Check your Weight same time everyday, if you gain over 2 pounds, or you develop in leg swelling, experience more shortness of breath or chest pain, call your Primary MD immediately. Follow Cardiac Low Salt Diet and 1.5 lit/Lejeune fluid restriction.  Special Instructions: If you have smoked or chewed Tobacco  in the last 2 yrs please stop smoking, stop any regular Alcohol  and or any Recreational drug use.  On your next visit with your primary care physician please Get Medicines reviewed and adjusted.  Please request your Prim.MD to go over all Hospital Tests and Procedure/Radiological results at the follow up, please get all Hospital records sent to your Prim MD by signing hospital release before you go home.  If you experience worsening of your admission symptoms, develop shortness of breath, life threatening emergency, suicidal or homicidal thoughts you must seek medical attention immediately by calling 911 or calling your MD immediately  if symptoms less severe.  You Must read complete instructions/literature along with all the possible adverse reactions/side effects for all the Medicines you take and that have been prescribed to you. Take any new Medicines after you have completely understood and accpet all the possible adverse reactions/side effects.   Do not drive when taking Pain medications or on Librium. Do not take more than prescribed Pain, Sleep and Anxiety Medications     Information on my medicine - Coumadin   (Warfarin)  Why was Coumadin prescribed for you? Coumadin was prescribed for you because you have a blood clot or a medical condition that can cause an increased risk of forming blood  clots. Blood clots can cause serious health problems by blocking the flow of blood to the heart, lung, or brain. Coumadin can prevent harmful blood clots from forming. As a reminder your indication for Coumadin is:   Pulmonary Embolism Treatment pulmonary embolism in the past.  What test will check on my response to Coumadin? While on Coumadin (warfarin) you will need to have an INR test regularly to ensure that your dose is keeping you in the desired range. The INR (international normalized ratio) number is calculated from the result of the laboratory test called prothrombin time (PT).  If an INR APPOINTMENT HAS NOT ALREADY BEEN MADE FOR YOU please schedule an appointment to have this lab work done by your health care provider within 7 days. Your INR goal is usually a number between:  2 to 3 or your provider may give you a more narrow range like 2-2.5.  Ask your health care provider during an office visit what your goal INR is.  What  do you need to  know  About  COUMADIN? Take Coumadin (warfarin) exactly as prescribed by your healthcare provider about the same time each [place].  DO NOT stop taking without talking to the doctor who prescribed the medication.  Stopping without other blood clot prevention medication to take the place of Coumadin may increase your risk of developing a new clot or stroke.  Get refills before you run out.  What do you do if you miss a dose? If you miss a dose, take it as soon as you remember on the  same [place] then continue your regularly scheduled regimen the next Sk[place].  Do not take two doses of Coumadin at the same time.  Important Safety Information A possible side effect of Coumadin (Warfarin) is an increased risk of bleeding. You should call your healthcare provider right away if you experience any of the following: ? Bleeding from an injury or your nose that does not stop. ? Unusual colored urine (red or dark brown) or unusual colored stools (red or black). ? Unusual  bruising for unknown reasons. ? A serious fall or if you hit your head (even if there is no bleeding).  Some foods or medicines interact with Coumadin (warfarin) and might alter your response to warfarin. To help avoid this: ? Eat a balanced diet, maintaining a consistent amount of Vitamin K. ? Notify your provider about major diet changes you plan to make. ? Avoid alcohol or limit your intake to 1 drink for women and 2 drinks for men per Artz. (1 drink is 5 oz. wine, 12 oz. beer, or 1.5 oz. liquor.)  Make sure that ANY health care provider who prescribes medication for you knows that you are taking Coumadin (warfarin).  Also make sure the healthcare provider who is monitoring your Coumadin knows when you have started a new medication including herbals and non-prescription products.  Coumadin (Warfarin)  Major Drug Interactions  Increased Warfarin Effect Decreased Warfarin Effect  Alcohol (large quantities) Antibiotics (esp. Septra/Bactrim, Flagyl, Cipro) Amiodarone (Cordarone) Aspirin (ASA) Cimetidine (Tagamet) Megestrol (Megace) NSAIDs (ibuprofen, naproxen, etc.) Piroxicam (Feldene) Propafenone (Rythmol SR) Propranolol (Inderal) Isoniazid (INH) Posaconazole (Noxafil) Barbiturates (Phenobarbital) Carbamazepine (Tegretol) Chlordiazepoxide (Librium) Cholestyramine (Questran) Griseofulvin Oral Contraceptives Rifampin Sucralfate (Carafate) Vitamin K   Coumadin (Warfarin) Major Herbal Interactions  Increased Warfarin Effect Decreased Warfarin Effect  Garlic Ginseng Ginkgo biloba Coenzyme Q10 Green tea St. Johns wort    Coumadin (Warfarin) FOOD Interactions  Eat a consistent number of servings per week of foods HIGH in Vitamin K (1 serving =  cup)  Collards (cooked, or boiled & drained) Kale (cooked, or boiled & drained) Mustard greens (cooked, or boiled & drained) Parsley *serving size only =  cup Spinach (cooked, or boiled & drained) Swiss chard (cooked, or boiled  & drained) Turnip greens (cooked, or boiled & drained)  Eat a consistent number of servings per week of foods MEDIUM-HIGH in Vitamin K (1 serving = 1 cup)  Asparagus (cooked, or boiled & drained) Broccoli (cooked, boiled & drained, or raw & chopped) Brussel sprouts (cooked, or boiled & drained) *serving size only =  cup Lettuce, raw (green leaf, endive, romaine) Spinach, raw Turnip greens, raw & chopped   These websites have more information on Coumadin (warfarin):  FailFactory.se; VeganReport.com.au;

## 2018-07-26 NOTE — Progress Notes (Addendum)
@IPLOG @        PROGRESS NOTE                                                                                                                                                                                                             Patient Demographics:    Leonard Bentley, is a 57 y.o. male, DOB - 1961-09-10, SWF:[id number]  Admit date - 07/25/2018   Admitting Physician [name], MD  Outpatient Primary MD for the patient is Argentina Donovan, PA-C  LOS - 0  Chief Complaint  Patient presents with  . Chest Pain       Brief Narrative  Leonard Bentley is a 57 y.o. male with medical history significant of MVP; NHL; HTN; depression; ETOH dependence; chronic combined heart failure; and PE on Coumadin presenting with chest pain.  He started his AM really good but he was having difficulty standing up by the time he got out of the shower due to weakness in his legs and lack of balance.  Slight pinpoint chest pain.  +SOB, even while trying to get dressed.  He has been feeling anxious/nervous.  +tremors when he gets aggravated.  He endorses visual hallucinations.  His last drink was 3-4 days ago.  He wants to quit drinking.   Subjective:    Caid Kaczmarczyk today has, No headache, No chest pain, No abdominal pain - No Nausea, No new weakness tingling or numbness, No Cough - SOB.     Assessment  & Plan :     1.  Severe DTs.  Placed on Librium scheduled along with clonidine scheduled low-dose, continue CIWA protocol.  Counseled to quit alcohol.  2.  Mild alcoholic hepatitis.  Improving.  3.  HX of PE.  On Coumadin.  INR supratherapeutic.  Trend improving we will continue to monitor, likely skip Coumadin for the next few days.  Pharmacy will be consulted.  4.  Hypertension with tachycardia.  Treat DTs, give IV fluid bolus and maintenance, stop Lopressor for now, low-dose clonidine with holding parameters.  5.  HX of alcohol abuse and smoking.  NicoDerm patch, counseled to quit both.  CIWA protocol as a  #1 above.    Family Communication  :  None  Code Status :  Full  Disposition Plan  :  Tele  Consults  :  None  Procedures  :  None  DVT Prophylaxis  : SCDs   Lab Results  Component Value Date   PLT 67 (L) 07/26/2018    Diet :  Diet Order            Diet Heart Room service appropriate? Yes; Fluid consistency: Thin  Diet effective now               Inpatient Medications Scheduled Meds: . chlordiazePOXIDE  15 mg Oral TID  . docusate sodium  100 mg Oral BID  . folic acid  1 mg Oral Daily  . metoprolol tartrate  25 mg Oral BID  . multivitamin with minerals  1 tablet Oral Daily  . nicotine  14 mg Transdermal Daily  . sodium chloride flush  3 mL Intravenous Q12H  . thiamine  100 mg Oral Daily   Continuous Infusions: . lactated ringers    . phytonadione (VITAMIN K) IV     PRN Meds:.LORazepam, [DISCONTINUED] ondansetron **OR** ondansetron (ZOFRAN) IV, oxyCODONE  Antibiotics  :   Anti-infectives (From admission, onward)   None          Objective:   Vitals:   07/26/18 0844 07/26/18 0858 07/26/18 1100 07/26/18 1144  BP: (!) 88/73   96/76  Pulse: 81   (!) 112  Resp:   19   Temp:  (!) 97.5 F (36.4 C)    TempSrc:  Oral    SpO2:    98%  Weight:      Height:        Wt Readings from Last 3 Encounters:  07/25/18 93 kg  06/12/18 87.5 kg  03/29/18 85.2 kg     Intake/Output Summary (Last 24 hours) at 07/26/2018 1251 Last data filed at 07/26/2018 1000 Gross per 24 hour  Intake 2307.38 ml  Output 50 ml  Net 2257.38 ml     Physical Exam  Awake Alert, Oriented X 3, No new F.N deficits, ++ tremors .AT,PERRAL Supple Neck,No JVD, No cervical lymphadenopathy appriciated.  Symmetrical Chest wall movement, Good air movement bilaterally, CTAB RRR,No Gallops,Rubs or new Murmurs, No Parasternal Heave +ve B.Sounds, Abd Soft, No tenderness, No organomegaly appriciated, No rebound - guarding or rigidity. No Cyanosis, Clubbing or edema, No new Rash or bruise       Data Review:    CBC Recent Labs  Lab 07/25/18 1230 07/26/18 0652  WBC 3.6* 2.4*  HGB 11.9* 10.6*  HCT 35.1* 32.2*  PLT 81* 67*  MCV 78.3 79.1  MCH 26.6 26.0  MCHC 33.9 32.9  RDW 18.0* 17.7*  LYMPHSABS 0.3*  --   MONOABS 0.5  --   EOSABS 0.0  --   BASOSABS 0.0  --     Chemistries  Recent Labs  Lab 07/25/18 1230 07/26/18 0652  NA 137 138  K 3.4* 3.4*  CL 93* 101  CO2 26 26  GLUCOSE 103* 85  BUN 11 10  CREATININE 1.35* 0.95  CALCIUM 9.6 8.3*  AST 157* 109*  ALT 79* 65*  ALKPHOS 52 45  BILITOT 2.3* 1.7*   ------------------------------------------------------------------------------------------------------------------ No results for input(s): CHOL, HDL, LDLCALC, TRIG, CHOLHDL, LDLDIRECT in the last 72 hours.  Lab Results  Component Value Date   HGBA1C 5.2 10/25/2016   ------------------------------------------------------------------------------------------------------------------ Recent Labs    07/25/18 2002  TSH 1.318   ------------------------------------------------------------------------------------------------------------------ No results for input(s): VITAMINB12, FOLATE, FERRITIN, TIBC, IRON, RETICCTPCT in the last 72 hours.  Coagulation profile Recent Labs  Lab 07/25/18 1230 07/26/18 0652  INR 8.38* 6.51*    No results for input(s): DDIMER in the last 72 hours.  Cardiac Enzymes No results for input(s): CKMB, TROPONINI, MYOGLOBIN in the last 168 hours.  Invalid input(s):  CK ------------------------------------------------------------------------------------------------------------------    Component Value Date/Time   BNP 71.3 11/19/2016 1013    Micro Results Recent Results (from the past 240 hour(s))  MRSA PCR Screening     Status: None   Collection Time: 07/26/18  5:43 AM  Result Value Ref Range Status   MRSA by PCR NEGATIVE NEGATIVE Final    Comment:        The GeneXpert MRSA Assay (FDA approved for NASAL  specimens only), is one component of a comprehensive MRSA colonization surveillance program. It is not intended to diagnose MRSA infection nor to guide or monitor treatment for MRSA infections. Performed at [facility], [place] [address], [place], [place] [address]     Radiology Reports Dg Chest 2 View  Result Date: 07/25/2018 CLINICAL DATA:  57 year old male with shortness of breath, palpitations. EXAM: CHEST - 2 VIEW COMPARISON:  CTA chest 06/05/2018 and earlier. FINDINGS: Stable cardiac and mediastinal contours with prostatic mitral valve. Lung volumes are within normal limits. Visualized tracheal air column is within normal limits. No pneumothorax, pleural effusion or consolidation. Chronic pulmonary reticulonodular opacity, greater in the right lung, stable since 2017. No acute or confluent pulmonary opacity. Negative visible bowel gas pattern. No acute osseous abnormality identified. IMPRESSION: No acute findings. Chronic small miliary lung nodules, more apparent in the right lung. Electronically Signed   By: Genevie Ann M.D.   On: 07/25/2018 13:04    Time Spent in minutes  30   Lala Lund M.D on 07/26/2018 at 12:51 PM  To page go to [email or web address] - password [facility]

## 2018-07-26 NOTE — Progress Notes (Signed)
Pt very agitated, attempting to get out of bed. Alert and oriented to self and time only. Taking telemetry leads off and trying to pull IV out. Baltazar Najjar, NP notified and order for haldol received.  Pt medicated, will continue to monitor

## 2018-07-27 LAB — PROTIME-INR
INR: 5.43
Prothrombin Time: 49.1 seconds — ABNORMAL HIGH (ref 11.4–15.2)

## 2018-07-27 LAB — COMPREHENSIVE METABOLIC PANEL
ALT: 73 U/L — ABNORMAL HIGH (ref 0–44)
AST: 121 U/L — ABNORMAL HIGH (ref 15–41)
Albumin: 3.1 g/dL — ABNORMAL LOW (ref 3.5–5.0)
Alkaline Phosphatase: 50 U/L (ref 38–126)
Anion gap: 8 (ref 5–15)
BUN: 9 mg/dL (ref 6–20)
CO2: 27 mmol/L (ref 22–32)
Calcium: 8.2 mg/dL — ABNORMAL LOW (ref 8.9–10.3)
Chloride: 105 mmol/L (ref 98–111)
Creatinine, Ser: 0.92 mg/dL (ref 0.61–1.24)
GFR calc Af Amer: >60 mL/min (ref >60)
GFR calc non Af Amer: >60 mL/min (ref >60)
Glucose, Bld: 109 mg/dL — ABNORMAL HIGH (ref 70–99)
Potassium: 3.4 mmol/L — ABNORMAL LOW (ref 3.5–5.1)
Sodium: 140 mmol/L (ref 135–145)
Total Bilirubin: 1.2 mg/dL (ref 0.3–1.2)
Total Protein: 5.6 g/dL — ABNORMAL LOW (ref 6.5–8.1)

## 2018-07-27 LAB — CBC
HCT: 32.6 % — ABNORMAL LOW (ref 39.0–52.0)
Hemoglobin: 10.8 g/dL — ABNORMAL LOW (ref 13.0–17.0)
MCH: 26.5 pg (ref 26.0–34.0)
MCHC: 33.1 g/dL (ref 30.0–36.0)
MCV: 79.9 fL (ref 78.0–100.0)
Platelets: 79 10*3/uL — ABNORMAL LOW (ref 150–400)
RBC: 4.08 MIL/uL — ABNORMAL LOW (ref 4.22–5.81)
RDW: 18.1 % — ABNORMAL HIGH (ref 11.5–15.5)
WBC: 3 10*3/uL — ABNORMAL LOW (ref 4.0–10.5)

## 2018-07-27 LAB — MAGNESIUM: Magnesium: 1.1 mg/dL — ABNORMAL LOW (ref 1.7–2.4)

## 2018-07-27 MED ORDER — LACTATED RINGERS IV SOLN
INTRAVENOUS | Status: DC
  Administered 2018-07-27: 1000 mL via INTRAVENOUS
  Administered 2018-07-28: via INTRAVENOUS

## 2018-07-27 MED ORDER — CLONIDINE HCL 0.2 MG PO TABS
0.2000 mg | ORAL_TABLET | Freq: Three times a day (TID) | ORAL | Status: DC
  Administered 2018-07-27 – 2018-07-28 (×5): 0.2 mg via ORAL
  Filled 2018-07-27 (×5): qty 1

## 2018-07-27 MED ORDER — POTASSIUM CHLORIDE CRYS ER 20 MEQ PO TBCR
40.0000 meq | EXTENDED_RELEASE_TABLET | Freq: Once | ORAL | Status: AC
  Administered 2018-07-27: 40 meq via ORAL
  Filled 2018-07-27: qty 2

## 2018-07-27 MED ORDER — MAGNESIUM SULFATE 4 GM/100ML IV SOLN
4.0000 g | Freq: Once | INTRAVENOUS | Status: AC
  Administered 2018-07-27: 4 g via INTRAVENOUS
  Filled 2018-07-27: qty 100

## 2018-07-27 NOTE — Evaluation (Signed)
Occupational Therapy Evaluation Patient Details Name: Leonard Bentley MRN: [id number] DOB: December 17, 1960 Today's Date: 07/27/2018    History of Present Illness Leonard Bentley is a 57 y.o. male with medical history significant of MVP; NHL; HTN; depression; ETOH dependence; chronic combined heart failure; and PE on Coumadin presenting with chest pain.  He started his AM really good but he was having difficulty standing up by the time he got out of the shower due to weakness in his legs and lack of balance.  Slight pinpoint chest pain.  +SOB, even while trying to get dressed.  He has been feeling anxious/nervous.  +tremors when he gets aggravated.  He endorses visual hallucinations.  His last drink was 3-4+ days ago.  He wants to quit drinking.   Clinical Impression   Pt admitted as above with deficits as listed below (please see OT problem list) impacting his ability to perform ADL's and functional mobility related to ADL's. Tremors in both upper and lower extremities are causing increased risk of falls and deficits with fm/coordination. He lives alone & reports that he does not have family assist and is unsure of level of assist that friends may or may not be able to provide at d/c. Currently recommend acute OT followed by 24/7 assistance PRN. ? Consider weighted utensils and ADL's next therapy session.    Follow Up Recommendations  SNF;Supervision/Assistance - 24 hour    Equipment Recommendations  Other (comment)(Defer to next venue of care)    Recommendations for Other Services       Precautions / Restrictions Precautions Precautions: Fall Precaution Comments: Bilateral upper and lower body tremors noted Restrictions Weight Bearing Restrictions: No      Mobility Bed Mobility Overal bed mobility: Needs Assistance Bed Mobility: Supine to Sit;Sit to Supine     Supine to sit: Modified independent (Device/Increase time);Supervision Sit to supine: Supervision   General bed mobility comments:  VC's for safety and sequencing.  Transfers Overall transfer level: Needs assistance Equipment used: Rolling walker (2 wheeled) Transfers: Sit to/from Stand Sit to Stand: Min assist         General transfer comment: [place] for safety, sequencing & hand placement secondary to bilateral upper and lower body tremors impacting functional mobility.    Balance Overall balance assessment: Needs assistance Sitting-balance support: Single extremity supported Sitting balance-Leahy Scale: Fair     Standing balance support: No upper extremity supported Standing balance-Leahy Scale: Poor Standing balance comment: Pt with increased support and fair minus assist with use of RW in standing                           ADL either performed or assessed with clinical judgement   ADL Overall ADL's : Needs assistance/impaired Eating/Feeding: Set up;Bed level Eating/Feeding Details (indicate cue type and reason): Pt requires assistance for set up (opening packages, cutting meat etc secondary to tremors. ??consider weighted utensils for increased independence. Grooming: Set up;Sitting;Minimal assistance   Upper Body Bathing: Set up;Minimal assistance;Sitting   Lower Body Bathing: Sit to/from stand;Minimal assistance   Upper Body Dressing : Minimal assistance;Sitting Upper Body Dressing Details (indicate cue type and reason): FM/coordination tasks are difficult for pt secondary to tremors Lower Body Dressing: Minimal assistance;Sit to/from stand   Toilet Transfer: Minimal assistance;RW;Ambulation;BSC(Simulated with sit to stand from bed and taking several steps and then back to bed using RW. Pt was assisted back to bed secondary to INR >5 noted per chart  review. Pt also declined need to use 3:1 at bedside )   Toileting- Clothing Manipulation and Hygiene: Minimal assistance;Sit to/from stand;Sitting/lateral lean       Functional mobility during ADLs: Minimal assistance;Rolling  walker;Cueing for safety General ADL Comments: Pt was seen for OT evaluation followed by brief participation in ADL retraining session with focus on bed mobility & functional transfers in preparation for increased participation in ADL's. Pt was also educated verbally in role of OT and plan of care at bedside. Note limited functional mobility during OT Eval secondary to elevated INR >5.     Vision Baseline Vision/History: Wears glasses Wears Glasses: Distance only(wears glasses when driving) Patient Visual Report: Blurring of vision       Perception     Praxis      Pertinent Vitals/Pain Pain Assessment: No/denies pain     Hand Dominance Right   Extremity/Trunk Assessment Upper Extremity Assessment Upper Extremity Assessment: Overall WFL for tasks assessed(WFL's for strength however Bilateral UE tremors impacting pt ability to perform ADL's, FM/coordination tasks noted)   Lower Extremity Assessment Lower Extremity Assessment: Defer to PT evaluation   Cervical / Trunk Assessment Cervical / Trunk Assessment: Normal   Communication Communication Communication: No difficulties   Cognition Arousal/Alertness: Awake/alert Behavior During Therapy: WFL for tasks assessed/performed Overall Cognitive Status: Within Functional Limits for tasks assessed Area of Impairment: Orientation                 Orientation Level: Time             General Comments: Pt stated correct [place], date but said 1999 for year, was able to correct with verbal cues.   General Comments  Limited functional mobility performed this date secondary to bilateral upper and lower body tremor combined with INR >5 noted upon chart review and bleeding precautions.    Exercises     Shoulder Instructions      Home Living Family/patient expects to be discharged to:: Private residence Living Arrangements: Alone Available Help at Discharge: Friend(s)(Possibly available, pt unsure) Type of Home: House Home  Access: Stairs to enter [employer] of Steps: 3 Entrance Stairs-Rails: Left Home Layout: One level     Bathroom Shower/Tub: Teacher, early years/pre: Standard     Home Equipment: None          Prior Functioning/Environment Level of Independence: Independent        Comments: Pt reports that he took increased time for all ADL tasks especially FM/coordination. States that he does not have any DME or A/E        OT Problem List: Decreased activity tolerance;Decreased knowledge of use of DME or AE;Impaired UE functional use;Decreased coordination;Impaired balance (sitting and/or standing);Decreased knowledge of precautions      OT Treatment/Interventions: Self-care/ADL training;DME and/or AE instruction;Therapeutic activities;Patient/family education(Balance to be addressed in functional context of ADL's)    OT Goals(Current goals can be found in the care plan section) Acute Rehab OT Goals Patient Stated Goal: Go home OT Goal Formulation: With patient Time For Goal Achievement: 08/10/18 Potential to Achieve Goals: Good  OT Frequency: Min 2X/week   Barriers to D/C: Other (comment)(Lives alone and is uncertain if friends can assist him at d/c)          Co-evaluation              AM-PAC PT "6 Clicks" Daily Activity     Outcome Measure Help from another person eating meals?: A Little Help from another person  taking care of personal grooming?: A Little Help from another person toileting, which includes using toliet, bedpan, or urinal?: A Little Help from another person bathing (including washing, rinsing, drying)?: A Little Help from another person to put on and taking off regular upper body clothing?: A Little Help from another person to put on and taking off regular lower body clothing?: A Little 6 Click Score: 18   End of Session Equipment Utilized During Treatment: Rolling walker  Activity Tolerance: Patient tolerated treatment well Patient  left: in bed;with call bell/phone within reach;with bed alarm set;with nursing/sitter in room  OT Visit Diagnosis: Unsteadiness on feet (R26.81);Muscle weakness (generalized) (M62.81);Other (comment)(Increased Fall risk)                Time: 1005-1030 OT Time Calculation (min): 25 min Charges:  OT General Charges $OT Visit: 1 Visit OT Evaluation $OT Eval Moderate Complexity: 1 Mod OT Treatments $Self Care/Home Management : 8-22 mins   Raysa Bosak Beth Dixon, OTR/L 07/27/2018, 10:52 AM

## 2018-07-27 NOTE — Progress Notes (Signed)
INR 5.35. No signs of active bleeding. Dr. Candiss Norse made aware.

## 2018-07-27 NOTE — Progress Notes (Signed)
@IPLOG @        PROGRESS NOTE                                                                                                                                                                                                             Patient Demographics:    Leonard Bentley, is a 57 y.o. male, DOB - 26-Sep-1961, KNL:[id number]  Admit date - 07/25/2018   Admitting Physician [name], MD  Outpatient Primary MD for the patient is Argentina Donovan, PA-C  LOS - 1  Chief Complaint  Patient presents with  . Chest Pain       Brief Narrative  Leonard Bentley is a 57 y.o. male with medical history significant of MVP; NHL; HTN; depression; ETOH dependence; chronic combined heart failure; and PE on Coumadin presenting with chest pain.  He started his AM really good but he was having difficulty standing up by the time he got out of the shower due to weakness in his legs and lack of balance.  Slight pinpoint chest pain.  +SOB, even while trying to get dressed.  He has been feeling anxious/nervous.  +tremors when he gets aggravated.  He endorses visual hallucinations.  His last drink was 3-4 days ago.  He wants to quit drinking.   Subjective:   Patient in bed, appears comfortable, denies any headache, no fever, no chest pain or pressure, no shortness of breath , no abdominal pain. No focal weakness.   Assessment  & Plan :     1.  Severe DTs.  Placed on Librium scheduled along with clonidine scheduled low-dose, continue CIWA protocol.  Counseled to quit alcohol.  2.  Mild alcoholic hepatitis.  Proving with supportive care.  3.  HX of PE.  On Coumadin.  INR supratherapeutic.  Trend improving we will continue to monitor, likely skip Coumadin for the next few days.  Pharmacy has been consulted.  4.  Hypertension with tachycardia.  Continue to treat DTs, continue hydration, placed on clonidine and dose increased on 07/27/2018 for better control.  5.  HX of alcohol abuse and smoking.  NicoDerm patch,  counseled to quit both.  CIWA protocol as a #1 above.    Family Communication  :  None  Code Status :  Full  Disposition Plan  :  Med  Consults  :  None  Procedures  :  None  DVT Prophylaxis  : SCDs   Lab Results  Component Value Date   PLT 79 (L) 07/27/2018    Diet :  Diet Order            DIET SOFT Room service appropriate? Yes; Fluid consistency: Thin  Diet effective now               Inpatient Medications Scheduled Meds: . chlordiazePOXIDE  15 mg Oral TID  . cloNIDine  0.1 mg Oral BID  . docusate sodium  100 mg Oral BID  . folic acid  1 mg Oral Daily  . multivitamin with minerals  1 tablet Oral Daily  . nicotine  14 mg Transdermal Daily  . sodium chloride flush  3 mL Intravenous Q12H  . thiamine  100 mg Oral Daily  . Warfarin - Pharmacist Dosing Inpatient   Does not apply q1800   Continuous Infusions: . lactated ringers 1,000 mL (07/27/18 1004)   PRN Meds:.LORazepam, [DISCONTINUED] ondansetron **OR** ondansetron (ZOFRAN) IV, oxyCODONE  Antibiotics  :   Anti-infectives (From admission, onward)   None          Objective:   Vitals:   07/26/18 1751 07/26/18 2048 07/26/18 2325 07/27/18 0800  BP: 127/89 (!) 137/114 97/79 105/87  Pulse: 80 90  72  Resp: 20 18  20   Temp:  33.0 F (07.6 C)  (!) 97.5 F (36.4 C)  TempSrc:  Oral  Oral  SpO2: 100% 100%  99%  Weight:      Height:        Wt Readings from Last 3 Encounters:  07/25/18 93 kg  06/12/18 87.5 kg  03/29/18 85.2 kg     Intake/Output Summary (Last 24 hours) at 07/27/2018 1020 Last data filed at 07/27/2018 1010 Gross per 24 hour  Intake 2009.75 ml  Output -  Net 2009.75 ml     Physical Exam  Awake Alert, Oriented X 3, No new F.N deficits, Normal affect, improved tremors Lloyd.AT,PERRAL Supple Neck,No JVD, No cervical lymphadenopathy appriciated.  Symmetrical Chest wall movement, Good air movement bilaterally, CTAB RRR,No Gallops, Rubs or new Murmurs, No Parasternal Heave +ve  B.Sounds, Abd Soft, No tenderness, No organomegaly appriciated, No rebound - guarding or rigidity. No Cyanosis, Clubbing or edema, No new Rash or bruise     Data Review:    CBC Recent Labs  Lab 07/25/18 1230 07/26/18 0652 07/27/18 0458  WBC 3.6* 2.4* 3.0*  HGB 11.9* 10.6* 10.8*  HCT 35.1* 32.2* 32.6*  PLT 81* 67* 79*  MCV 78.3 79.1 79.9  MCH 26.6 26.0 26.5  MCHC 33.9 32.9 33.1  RDW 18.0* 17.7* 18.1*  LYMPHSABS 0.3*  --   --   MONOABS 0.5  --   --   EOSABS 0.0  --   --   BASOSABS 0.0  --   --     Chemistries  Recent Labs  Lab 07/25/18 1230 07/26/18 0652 07/27/18 0458  NA 137 138 140  K 3.4* 3.4* 3.4*  CL 93* 101 105  CO2 26 26 27   GLUCOSE 103* 85 109*  BUN 11 10 9   CREATININE 1.35* 0.95 0.92  CALCIUM 9.6 8.3* 8.2*  MG  --   --  1.1*  AST 157* 109* 121*  ALT 79* 65* 73*  ALKPHOS 52 45 50  BILITOT 2.3* 1.7* 1.2   ------------------------------------------------------------------------------------------------------------------ No results for input(s): CHOL, HDL, LDLCALC, TRIG, CHOLHDL, LDLDIRECT in the last 72 hours.  Lab Results  Component Value Date   HGBA1C 5.2 10/25/2016   ------------------------------------------------------------------------------------------------------------------ Recent Labs    07/25/18 2002  TSH 1.318   ------------------------------------------------------------------------------------------------------------------ No results for input(s): VITAMINB12, FOLATE, FERRITIN,  TIBC, IRON, RETICCTPCT in the last 72 hours.  Coagulation profile Recent Labs  Lab 07/25/18 1230 07/26/18 0652 07/27/18 0458  INR 8.38* 6.51* 5.43*    No results for input(s): DDIMER in the last 72 hours.  Cardiac Enzymes No results for input(s): CKMB, TROPONINI, MYOGLOBIN in the last 168 hours.  Invalid input(s): CK ------------------------------------------------------------------------------------------------------------------    Component  Value Date/Time   BNP 71.3 11/19/2016 1013    Micro Results Recent Results (from the past 240 hour(s))  MRSA PCR Screening     Status: None   Collection Time: 07/26/18  5:43 AM  Result Value Ref Range Status   MRSA by PCR NEGATIVE NEGATIVE Final    Comment:        The GeneXpert MRSA Assay (FDA approved for NASAL specimens only), is one component of a comprehensive MRSA colonization surveillance program. It is not intended to diagnose MRSA infection nor to guide or monitor treatment for MRSA infections. Performed at [facility], [place] [address], [place], [place] [address]     Radiology Reports Dg Chest 2 View  Result Date: 07/25/2018 CLINICAL DATA:  57 year old male with shortness of breath, palpitations. EXAM: CHEST - 2 VIEW COMPARISON:  CTA chest 06/05/2018 and earlier. FINDINGS: Stable cardiac and mediastinal contours with prostatic mitral valve. Lung volumes are within normal limits. Visualized tracheal air column is within normal limits. No pneumothorax, pleural effusion or consolidation. Chronic pulmonary reticulonodular opacity, greater in the right lung, stable since 2017. No acute or confluent pulmonary opacity. Negative visible bowel gas pattern. No acute osseous abnormality identified. IMPRESSION: No acute findings. Chronic small miliary lung nodules, more apparent in the right lung. Electronically Signed   By: Genevie Ann M.D.   On: 07/25/2018 13:04    Time Spent in minutes  30   Lala Lund M.D on 07/27/2018 at 10:20 AM  To page go to [email or web address] - password [facility]

## 2018-07-27 NOTE — Progress Notes (Signed)
ANTICOAGULATION CONSULT NOTE - Follow Up Consult  Pharmacy Consult for Coumadin Indication: atrial fibrillation  No Known Allergies  Patient Measurements: Height: 5\' 9"  (175.3 cm) Weight: 205 lb (93 kg) IBW/kg (Calculated) : 70.7  Vital Signs: Temp: 97.5 F (36.4 C) (09/19 0800) Temp Source: Oral (09/19 0800) BP: 92/82 (09/19 1100) Pulse Rate: 105 (09/19 1100)  Labs: Recent Labs    07/25/18 1230 07/25/18 1343 07/26/18 0652 07/27/18 0458  HGB 11.9*  --  10.6* 10.8*  HCT 35.1*  --  32.2* 32.6*  PLT 81*  --  67* 79*  LABPROT 68.9*  --  56.6* 49.1*  INR 8.38*  --  6.51* 5.43*  CREATININE 1.35*  --  0.95 0.92  CKTOTAL  --  507*  --   --     Estimated Creatinine Clearance: 98.5 mL/min (by C-G formula based on SCr of 0.92 mg/dL).  Assessment:   57 yr old male on Coumadin prior to admission for recurrent PE.  Admitted 9/17 with chest pain.  INR 8.38 on admit, down to 5.43 after holding Coumadin for 2 days. Platelet count low as noted. No bleeding noted.   Home Coumadin regimen:  5 mg daily except 7.5 mg on Mondays.  Confirmed with patient. Last dose 9/16 (Monday)  Goal of Therapy:  INR 2-3 Monitor platelets by anticoagulation protocol: Yes   Plan:   No Coumadin again today.  Daily PT/INR.  Arty Baumgartner, [place] Pager: [phone] or phone: [phone] 07/27/2018,11:58 AM

## 2018-07-27 NOTE — Evaluation (Signed)
Physical Therapy Evaluation Patient Details Name: Leonard Bentley MRN: [id number] DOB: 29-Jan-1961 Today's Date: 07/27/2018   History of Present Illness  Leonard Bentley is a 57 y.o. male with medical history significant of MVP; NHL; HTN; depression; ETOH dependence; chronic combined heart failure; and PE on Coumadin presenting with chest pain.  He started his AM really good but he was having difficulty standing up by the time he got out of the shower due to weakness in his legs and lack of balance.  Slight pinpoint chest pain.  +SOB, even while trying to get dressed.  He has been feeling anxious/nervous.  +tremors when he gets aggravated.  He endorses visual hallucinations.  His last drink was 3-4+ days ago.  He wants to quit drinking.    Clinical Impression  Leonard Bentley admitted with the above listed diagnosis. Patient reports independence with mobility prior to admission. Patient today requiring general min A/guard level assist for safety and stability. B UE/LE tremors affecting safety with mobility increasing patients risk for falls. Will recommend SNF at discharge as patient with limited social support and need for higher level of care currently rather than returning home independently. PT to follow acutely to maximize safe functional mobility.     Follow Up Recommendations SNF;Supervision/Assistance - 24 hour    Equipment Recommendations  Other (comment)(defer)    Recommendations for Other Services OT consult     Precautions / Restrictions Precautions Precautions: Fall Precaution Comments: Bilateral upper and lower body tremors noted Restrictions Weight Bearing Restrictions: No      Mobility  Bed Mobility Overal bed mobility: Needs Assistance Bed Mobility: Supine to Sit     Supine to sit: Modified independent (Device/Increase time) Sit to supine: Supervision   General bed mobility comments: increased time and effort  Transfers Overall transfer level: Needs assistance Equipment used:  Rolling walker (2 wheeled) Transfers: Sit to/from Stand Sit to Stand: Min assist         General transfer comment: Min A to power up at bedside and for immedaite standing balance  Ambulation/Gait Ambulation/Gait assistance: Min assist Gait Distance (Feet): (hallway ambulation) Assistive device: Rolling walker (2 wheeled);None Gait Pattern/deviations: Step-to pattern;Step-through pattern;Decreased stride length;Trunk flexed Gait velocity: decreased   General Gait Details: ambulated in hallway with RW - very unsteady with B UE/LE tremors increasing fall risk  Stairs            Wheelchair Mobility    Modified Rankin (Stroke Patients Only)       Balance Overall balance assessment: Needs assistance Sitting-balance support: Single extremity supported Sitting balance-Leahy Scale: Fair     Standing balance support: Bilateral upper extremity supported;During functional activity Standing balance-Leahy Scale: Poor Standing balance comment: reliant on UE support                             Pertinent Vitals/Pain Pain Assessment: No/denies pain    Home Living Family/patient expects to be discharged to:: Private residence Living Arrangements: Alone Available Help at Discharge: Friend(s) Type of Home: House Home Access: Stairs to enter Entrance Stairs-Rails: Left Entrance Stairs-Number of Steps: 3 Home Layout: One level Home Equipment: None      Prior Function Level of Independence: Independent         Comments: Pt reports that he took increased time for all ADL tasks especially FM/coordination. States that he does not have any DME or A/E     Hand Dominance   Dominant Hand:  Right    Extremity/Trunk Assessment   Upper Extremity Assessment Upper Extremity Assessment: Defer to OT evaluation    Lower Extremity Assessment Lower Extremity Assessment: Generalized weakness(noted tremors, instability)    Cervical / Trunk Assessment Cervical / Trunk  Assessment: Normal  Communication   Communication: No difficulties  Cognition Arousal/Alertness: Awake/alert Behavior During Therapy: WFL for tasks assessed/performed Overall Cognitive Status: Within Functional Limits for tasks assessed Area of Impairment: Orientation                 Orientation Level: Person;Place;Time;Situation             General Comments: A&O x 4      General Comments General comments (skin integrity, edema, etc.): nursing stating PT allowance to see patient; performed mobility with increased caution due to elevated INR    Exercises     Assessment/Plan    PT Assessment Patient needs continued PT services  PT Problem List Decreased mobility;Decreased activity tolerance;Decreased balance;Decreased knowledge of use of DME;Decreased coordination       PT Treatment Interventions Gait training;Therapeutic exercise;Patient/family education;Stair training;Balance training;Functional mobility training;Neuromuscular re-education;DME instruction;Therapeutic activities    PT Goals (Current goals can be found in the Care Plan section)  Acute Rehab PT Goals Patient Stated Goal: Go home PT Goal Formulation: With patient Time For Goal Achievement: 08/10/18 Potential to Achieve Goals: Good    Frequency Min 3X/week   Barriers to discharge Decreased caregiver support      Co-evaluation               AM-PAC PT "6 Clicks" Daily Activity  Outcome Measure Difficulty turning over in bed (including adjusting bedclothes, sheets and blankets)?: A Little Difficulty moving from lying on back to sitting on the side of the bed? : A Little Difficulty sitting down on and standing up from a chair with arms (e.g., wheelchair, bedside commode, etc,.)?: Unable Help needed moving to and from a bed to chair (including a wheelchair)?: A Little Help needed walking in hospital room?: A Little Help needed climbing 3-5 steps with a railing? : A Lot 6 Click Score: 15     End of Session Equipment Utilized During Treatment: Gait belt Activity Tolerance: Patient tolerated treatment well Patient left: in chair;with call bell/phone within reach;with chair alarm set;with nursing/sitter in room Nurse Communication: Mobility status PT Visit Diagnosis: Other abnormalities of gait and mobility (R26.89);Unsteadiness on feet (R26.81)    Time: 6811-5726 PT Time Calculation (min) (ACUTE ONLY): 32 min   Charges:   PT Evaluation $PT Eval Moderate Complexity: 1 Mod PT Treatments $Gait Training: 8-22 mins       Lanney Gins, PT, DPT Supplemental Physical Therapist 07/27/18 1:32 PM Pager: [phone] Office: [phone]

## 2018-07-28 LAB — CBC
HCT: 30.8 % — ABNORMAL LOW (ref 39.0–52.0)
Hemoglobin: 10.2 g/dL — ABNORMAL LOW (ref 13.0–17.0)
MCH: 26.4 pg (ref 26.0–34.0)
MCHC: 33.1 g/dL (ref 30.0–36.0)
MCV: 79.6 fL (ref 78.0–100.0)
Platelets: 86 10*3/uL — ABNORMAL LOW (ref 150–400)
RBC: 3.87 MIL/uL — ABNORMAL LOW (ref 4.22–5.81)
RDW: 18.1 % — ABNORMAL HIGH (ref 11.5–15.5)
WBC: 3 10*3/uL — ABNORMAL LOW (ref 4.0–10.5)

## 2018-07-28 LAB — COMPREHENSIVE METABOLIC PANEL
ALT: 85 U/L — ABNORMAL HIGH (ref 0–44)
AST: 115 U/L — ABNORMAL HIGH (ref 15–41)
Albumin: 3 g/dL — ABNORMAL LOW (ref 3.5–5.0)
Alkaline Phosphatase: 43 U/L (ref 38–126)
Anion gap: 8 (ref 5–15)
BUN: 8 mg/dL (ref 6–20)
CO2: 25 mmol/L (ref 22–32)
Calcium: 8.6 mg/dL — ABNORMAL LOW (ref 8.9–10.3)
Chloride: 107 mmol/L (ref 98–111)
Creatinine, Ser: 0.79 mg/dL (ref 0.61–1.24)
GFR calc Af Amer: >60 mL/min (ref >60)
GFR calc non Af Amer: >60 mL/min (ref >60)
Glucose, Bld: 119 mg/dL — ABNORMAL HIGH (ref 70–99)
Potassium: 3.8 mmol/L (ref 3.5–5.1)
Sodium: 140 mmol/L (ref 135–145)
Total Bilirubin: 1.4 mg/dL — ABNORMAL HIGH (ref 0.3–1.2)
Total Protein: 5.4 g/dL — ABNORMAL LOW (ref 6.5–8.1)

## 2018-07-28 LAB — PROTIME-INR
INR: 3.93
Prothrombin Time: 38.2 seconds — ABNORMAL HIGH (ref 11.4–15.2)

## 2018-07-28 LAB — MAGNESIUM: Magnesium: 1.6 mg/dL — ABNORMAL LOW (ref 1.7–2.4)

## 2018-07-28 MED ORDER — CHLORDIAZEPOXIDE HCL 5 MG PO CAPS
10.0000 mg | ORAL_CAPSULE | Freq: Three times a day (TID) | ORAL | Status: DC
  Administered 2018-07-28 – 2018-07-29 (×3): 10 mg via ORAL
  Filled 2018-07-28 (×3): qty 2

## 2018-07-28 NOTE — Progress Notes (Signed)
@IPLOG @        PROGRESS NOTE                                                                                                                                                                                                             Patient Demographics:    Leonard Bentley, is a 57 y.o. male, DOB - [birth year], QJJ:[id number]  Admit date - 07/25/2018   Admitting Physician [name], MD  Outpatient Primary MD for the patient is Mathis Dad  LOS - 2  Chief Complaint  Patient presents with  . Chest Pain       Brief Narrative  Leonard Bentley is a 57 y.o. male with medical history significant of MVP; NHL; HTN; depression; ETOH dependence; chronic combined heart failure; and PE on Coumadin presenting with chest pain.  He started his AM really good but he was having difficulty standing up by the time he got out of the shower due to weakness in his legs and lack of balance.  Slight pinpoint chest pain.  +SOB, even while trying to get dressed.  He has been feeling anxious/nervous.  +tremors when he gets aggravated.  He endorses visual hallucinations.  His last drink was 3-4 days ago.  He wants to quit drinking.   Subjective:   Patient in bed, appears comfortable, denies any headache, no fever, no chest pain or pressure, no shortness of breath , no abdominal pain. No focal weakness.   Assessment  & Plan :     1.  Severe DTs.  Placed on Librium scheduled along with clonidine scheduled low-dose, continue CIWA protocol.  Counseled to quit alcohol.  He still has significant weakness and poor balance, working with PT, likely discharge in the next 1 to 2 days.  Will continue tapering off Librium.  His tremors have improved.  2.  Mild alcoholic hepatitis.  Proving with supportive care.  3.  HX of PE.  On Coumadin.  INR supratherapeutic.  Trend improving we will continue to monitor, likely skip Coumadin for the next few days.  Pharmacy has been consulted.  4.  Hypertension with tachycardia.   Continue to treat DTs, continue hydration, placed on clonidine and dose increased on 07/27/2018 for better control.  5.  HX of alcohol abuse and smoking.  NicoDerm patch, counseled to quit both.  CIWA protocol as a #1 above.    Family Communication  :  None  Code Status :  Full  Disposition Plan  :  Med  Consults  :  None  Procedures  :  None  DVT Prophylaxis  : SCDs   Lab Results  Component Value Date   PLT 86 (L) 07/28/2018    Diet :  Diet Order            DIET SOFT Room service appropriate? Yes; Fluid consistency: Thin  Diet effective now               Inpatient Medications Scheduled Meds: . chlordiazePOXIDE  10 mg Oral TID  . cloNIDine  0.2 mg Oral TID  . docusate sodium  100 mg Oral BID  . folic acid  1 mg Oral Daily  . multivitamin with minerals  1 tablet Oral Daily  . nicotine  14 mg Transdermal Daily  . sodium chloride flush  3 mL Intravenous Q12H  . thiamine  100 mg Oral Daily  . Warfarin - Pharmacist Dosing Inpatient   Does not apply q1800   Continuous Infusions:  PRN Meds:.LORazepam, [DISCONTINUED] ondansetron **OR** ondansetron (ZOFRAN) IV, oxyCODONE  Antibiotics  :   Anti-infectives (From admission, onward)   None          Objective:   Vitals:   07/27/18 1416 07/27/18 1536 07/27/18 2108 07/28/18 0558  BP: 99/75 101/75 (!) 119/97 (!) 127/100  Pulse: 73 82 74 67  Resp: 18   19  Temp: 98.7 F (37.1 C)  98.3 F (36.8 C) 98.4 F (36.9 C)  TempSrc:      SpO2: 100%  100% 99%  Weight:      Height:        Wt Readings from Last 3 Encounters:  07/25/18 93 kg  06/12/18 87.5 kg  03/29/18 85.2 kg     Intake/Output Summary (Last 24 hours) at 07/28/2018 1018 Last data filed at 07/28/2018 1000 Gross per 24 hour  Intake 1430.85 ml  Output 1225 ml  Net 205.85 ml     Physical Exam  Awake Alert, Oriented X 3, No new F.N deficits, Normal affect, improved tremors Stuart.AT,PERRAL Supple Neck,No JVD, No cervical lymphadenopathy appriciated.   Symmetrical Chest wall movement, Good air movement bilaterally, CTAB RRR,No Gallops, Rubs or new Murmurs, No Parasternal Heave +ve B.Sounds, Abd Soft, No tenderness, No organomegaly appriciated, No rebound - guarding or rigidity. No Cyanosis, Clubbing or edema, No new Rash or bruise    Data Review:    CBC Recent Labs  Lab 07/25/18 1230 07/26/18 0652 07/27/18 0458 07/28/18 0508  WBC 3.6* 2.4* 3.0* 3.0*  HGB 11.9* 10.6* 10.8* 10.2*  HCT 35.1* 32.2* 32.6* 30.8*  PLT 81* 67* 79* 86*  MCV 78.3 79.1 79.9 79.6  MCH 26.6 26.0 26.5 26.4  MCHC 33.9 32.9 33.1 33.1  RDW 18.0* 17.7* 18.1* 18.1*  LYMPHSABS 0.3*  --   --   --   MONOABS 0.5  --   --   --   EOSABS 0.0  --   --   --   BASOSABS 0.0  --   --   --     Chemistries  Recent Labs  Lab 07/25/18 1230 07/26/18 0652 07/27/18 0458 07/28/18 0508  NA 137 138 140 140  K 3.4* 3.4* 3.4* 3.8  CL 93* 101 105 107  CO2 26 26 27 25   GLUCOSE 103* 85 109* 119*  BUN 11 10 9 8   CREATININE 1.35* 0.95 0.92 0.79  CALCIUM 9.6 8.3* 8.2* 8.6*  MG  --   --  1.1* 1.6*  AST 157* 109* 121* 115*  ALT 79* 65* 73* 85*  ALKPHOS 52 45 50 43  BILITOT 2.3* 1.7* 1.2 1.4*   ------------------------------------------------------------------------------------------------------------------ No results for input(s): CHOL, HDL, LDLCALC, TRIG, CHOLHDL, LDLDIRECT in the last 72 hours.  Lab Results  Component Value Date   HGBA1C 5.2 10/25/2016   ------------------------------------------------------------------------------------------------------------------ Recent Labs    07/25/18 2002  TSH 1.318   ------------------------------------------------------------------------------------------------------------------ No results for input(s): VITAMINB12, FOLATE, FERRITIN, TIBC, IRON, RETICCTPCT in the last 72 hours.  Coagulation profile Recent Labs  Lab 07/25/18 1230 07/26/18 0652 07/27/18 0458 07/28/18 0508  INR 8.38* 6.51* 5.43* 3.93    No  results for input(s): DDIMER in the last 72 hours.  Cardiac Enzymes No results for input(s): CKMB, TROPONINI, MYOGLOBIN in the last 168 hours.  Invalid input(s): CK ------------------------------------------------------------------------------------------------------------------    Component Value Date/Time   BNP 71.3 11/19/2016 1013    Micro Results Recent Results (from the past 240 hour(s))  MRSA PCR Screening     Status: None   Collection Time: 07/26/18  5:43 AM  Result Value Ref Range Status   MRSA by PCR NEGATIVE NEGATIVE Final    Comment:        The GeneXpert MRSA Assay (FDA approved for NASAL specimens only), is one component of a comprehensive MRSA colonization surveillance program. It is not intended to diagnose MRSA infection nor to guide or monitor treatment for MRSA infections. Performed at [facility], [place] [address] [place], [place] [address]     Radiology Reports Dg Chest 2 View  Result Date: 07/25/2018 CLINICAL DATA:  57 year old male with shortness of breath, palpitations. EXAM: CHEST - 2 VIEW COMPARISON:  CTA chest 06/05/2018 and earlier. FINDINGS: Stable cardiac and mediastinal contours with prostatic mitral valve. Lung volumes are within normal limits. Visualized tracheal air column is within normal limits. No pneumothorax, pleural effusion or consolidation. Chronic pulmonary reticulonodular opacity, greater in the right lung, stable since 2017. No acute or confluent pulmonary opacity. Negative visible bowel gas pattern. No acute osseous abnormality identified. IMPRESSION: No acute findings. Chronic small miliary lung nodules, more apparent in the right lung. Electronically Signed   By: Genevie Ann M.D.   On: 07/25/2018 13:04    Time Spent in minutes  30   Lala Lund M.D on 07/28/2018 at 10:18 AM  To page go to [email or web address] - password [email or web address]

## 2018-07-28 NOTE — Progress Notes (Signed)
ANTICOAGULATION CONSULT NOTE - Follow Up Consult  Pharmacy Consult for Coumadin Indication: atrial fibrillation  No Known Allergies  Patient Measurements: Height: 5\' 9"  (175.3 cm) Weight: 205 lb (93 kg) IBW/kg (Calculated) : 70.7  Vital Signs: Temp: 98.4 F (36.9 C) (09/20 0558) BP: 127/100 (09/20 0558) Pulse Rate: 67 (09/20 0558)  Labs: Recent Labs    07/25/18 1343 07/26/18 0652 07/27/18 0458 07/28/18 0508  HGB  --  10.6* 10.8* 10.2*  HCT  --  32.2* 32.6* 30.8*  PLT  --  67* 79* 86*  LABPROT  --  56.6* 49.1* 38.2*  INR  --  6.51* 5.43* 3.93  CREATININE  --  0.95 0.92 0.79  CKTOTAL 507*  --   --   --     Estimated Creatinine Clearance: 113.3 mL/min (by C-G formula based on SCr of 0.79 mg/dL).  Assessment:   57 yr old male on Coumadin prior to admission for recurrent PE.  Admitted 9/17 with chest pain.  INR 8.38 on admit, down to 3.93 after holding Coumadin for 3 days. Platelet count low as noted. No bleeding noted.  LFTs slightly elevated, trended down from admit.   Home Coumadin regimen:  5 mg daily except 7.5 mg on Mondays.  Confirmed with patient. Last dose 9/16 (Monday)  Goal of Therapy:  INR 2-3 Monitor platelets by anticoagulation protocol: Yes   Plan:   No Coumadin again today.  Daily PT/INR.  Arty Baumgartner, [place] Pager: [phone] or phone: [phone] 07/28/2018,12:28 PM

## 2018-07-28 NOTE — Progress Notes (Signed)
Occupational Therapy Treatment Patient Details Name: Leonard Bentley MRN: [id number] DOB: 10-25-1961 Today's Date: 07/28/2018    History of present illness Leonard Bentley is a 57 y.o. male with medical history significant of MVP; NHL; HTN; depression; ETOH dependence; chronic combined heart failure; and PE on Coumadin presenting with chest pain.  He started his AM really good but he was having difficulty standing up by the time he got out of the shower due to weakness in his legs and lack of balance.  Slight pinpoint chest pain.  +SOB, even while trying to get dressed.  He has been feeling anxious/nervous.  +tremors when he gets aggravated.  He endorses visual hallucinations.  His last drink was 3-4+ days ago.  He wants to quit drinking.   OT comments  Pt progressing towards OT therapy goals, presents supine in bed agreeable to therapy session. Pt requires minA for functional mobility within room using RW during toileting and standing grooming ADLs. He continues to require hands on assist throughout for balance as pt continues to demonstrate unsteadiness with dynamic tasks. Issued pt weighted utensil to trial during self-feeding as pt continues to have difficulty due to bil UE tremors; pt reporting improvements with use of AE. Given pt lives alone and his current level of assist continue to recommend SNF level therapies at time of discharge to maximize his overall safety and independence with ADLs and mobility. Will continue to follow acutely to progress pt towards established OT goals.    Follow Up Recommendations  SNF;Supervision/Assistance - 24 hour    Equipment Recommendations  3 in 1 bedside commode;Other (comment)(TBD in next venue )          Precautions / Restrictions Precautions Precautions: Fall Precaution Comments: Bilateral upper and lower body tremors noted Restrictions Weight Bearing Restrictions: No       Mobility Bed Mobility Overal bed mobility: Needs Assistance Bed Mobility:  Supine to Sit     Supine to sit: Modified independent (Device/Increase time)     General bed mobility comments: increased time and effort  Transfers Overall transfer level: Needs assistance Equipment used: Rolling walker (2 wheeled) Transfers: Sit to/from Stand Sit to Stand: Min assist         General transfer comment: Min A to power up at bedside and for immedaite standing balance    Balance Overall balance assessment: Needs assistance Sitting-balance support: Single extremity supported Sitting balance-Leahy Scale: Fair     Standing balance support: Bilateral upper extremity supported;During functional activity Standing balance-Leahy Scale: Poor Standing balance comment: reliant on UE support                           ADL either performed or assessed with clinical judgement   ADL Overall ADL's : Needs assistance/impaired Eating/Feeding: Set up;Sitting Eating/Feeding Details (indicate cue type and reason): trialed weighted fork during this session; pt continue to demonstrate tremors with bringing hand to mouth using utensil, but pt reports increased ease of task completion with AE. Will continue to follow up  Grooming: Wash/dry hands;Minimal assistance;Standing;Set up;Sitting;Wash/dry face Grooming Details (indicate cue type and reason): pt washing hands standing at sink with minA for standing balance; setup assist for brushing teeth and washing face after sitting in recliner                  Toilet Transfer: Minimal assistance;Ambulation;Regular Toilet;Grab bars;RW   Toileting- Clothing Manipulation and Hygiene: Minimal assistance;Sit to/from stand Electrical engineer  Details (indicate cue type and reason): assist for gown management     Functional mobility during ADLs: Minimal assistance;Rolling walker;Cueing for safety General ADL Comments: pt ambulating within room using RW with overall minA, requires consistent hands on assist due to  unsteadiness; pt also noted to bump into items on the L with RW requiring increased cues for safety      Vision   Additional Comments: pt noted to occasionally bump into items on the L during mobility; no apparent peripheral visual deficits noted during formal testing, will continue to assess    Perception     Praxis      Cognition Arousal/Alertness: Awake/alert Behavior During Therapy: WFL for tasks assessed/performed Overall Cognitive Status: Within Functional Limits for tasks assessed                                                            Pertinent Vitals/ Pain       Pain Assessment: No/denies pain  Home Living                                          Prior Functioning/Environment              Frequency  Min 2X/week        Progress Toward Goals  OT Goals(current goals can now be found in the care plan section)  Progress towards OT goals: Progressing toward goals  Acute Rehab OT Goals Patient Stated Goal: Go home OT Goal Formulation: With patient Time For Goal Achievement: 08/10/18 Potential to Achieve Goals: Good  Plan Discharge plan remains appropriate    Co-evaluation                 AM-PAC PT "6 Clicks" Daily Activity     Outcome Measure   Help from another person eating meals?: A Little Help from another person taking care of personal grooming?: A Little Help from another person toileting, which includes using toliet, bedpan, or urinal?: A Little Help from another person bathing (including washing, rinsing, drying)?: A Little Help from another person to put on and taking off regular upper body clothing?: A Little Help from another person to put on and taking off regular lower body clothing?: A Little 6 Click Score: 18    End of Session Equipment Utilized During Treatment: Rolling walker;Gait belt  OT Visit Diagnosis: Unsteadiness on feet (R26.81);Muscle weakness (generalized) (M62.81)    Activity Tolerance Patient tolerated treatment well   Patient Left in chair;with call bell/phone within reach;with chair alarm set   Nurse Communication Mobility status        Time: 1610-9604 OT Time Calculation (min): 36 min  Charges: OT General Charges $OT Visit: 1 Visit OT Treatments $Self Care/Home Management : 23-37 mins  Lou Cal, OT Supplemental Rehabilitation Services Pager [phone] Office [phone]     Raymondo Band 07/28/2018, 1:19 PM

## 2018-07-29 LAB — COMPREHENSIVE METABOLIC PANEL
ALT: 95 U/L — ABNORMAL HIGH (ref 0–44)
AST: 115 U/L — ABNORMAL HIGH (ref 15–41)
Albumin: 3 g/dL — ABNORMAL LOW (ref 3.5–5.0)
Alkaline Phosphatase: 45 U/L (ref 38–126)
Anion gap: 8 (ref 5–15)
BUN: 8 mg/dL (ref 6–20)
CO2: 25 mmol/L (ref 22–32)
Calcium: 8.7 mg/dL — ABNORMAL LOW (ref 8.9–10.3)
Chloride: 105 mmol/L (ref 98–111)
Creatinine, Ser: 0.84 mg/dL (ref 0.61–1.24)
GFR calc Af Amer: >60 mL/min (ref >60)
GFR calc non Af Amer: >60 mL/min (ref >60)
Glucose, Bld: 115 mg/dL — ABNORMAL HIGH (ref 70–99)
Potassium: 3.7 mmol/L (ref 3.5–5.1)
Sodium: 138 mmol/L (ref 135–145)
Total Bilirubin: 1.4 mg/dL — ABNORMAL HIGH (ref 0.3–1.2)
Total Protein: 5.8 g/dL — ABNORMAL LOW (ref 6.5–8.1)

## 2018-07-29 LAB — CBC
HCT: 31.4 % — ABNORMAL LOW (ref 39.0–52.0)
Hemoglobin: 10.5 g/dL — ABNORMAL LOW (ref 13.0–17.0)
MCH: 26.7 pg (ref 26.0–34.0)
MCHC: 33.4 g/dL (ref 30.0–36.0)
MCV: 79.9 fL (ref 78.0–100.0)
Platelets: 108 10*3/uL — ABNORMAL LOW (ref 150–400)
RBC: 3.93 MIL/uL — ABNORMAL LOW (ref 4.22–5.81)
RDW: 18.4 % — ABNORMAL HIGH (ref 11.5–15.5)
WBC: 3.4 10*3/uL — ABNORMAL LOW (ref 4.0–10.5)

## 2018-07-29 LAB — PROTIME-INR
INR: 2.17
Prothrombin Time: 24 seconds — ABNORMAL HIGH (ref 11.4–15.2)

## 2018-07-29 MED ORDER — WARFARIN SODIUM 6 MG PO TABS
6.0000 mg | ORAL_TABLET | Freq: Once | ORAL | Status: AC
  Administered 2018-07-29: 6 mg via ORAL
  Filled 2018-07-29: qty 1

## 2018-07-29 MED ORDER — CLONIDINE HCL 0.1 MG PO TABS
0.1000 mg | ORAL_TABLET | Freq: Two times a day (BID) | ORAL | Status: DC
  Administered 2018-07-29 (×2): 0.1 mg via ORAL
  Filled 2018-07-29 (×2): qty 1

## 2018-07-29 MED ORDER — MAGNESIUM SULFATE 2 GM/50ML IV SOLN
2.0000 g | Freq: Once | INTRAVENOUS | Status: AC
  Administered 2018-07-29: 2 g via INTRAVENOUS
  Filled 2018-07-29: qty 50

## 2018-07-29 MED ORDER — CHLORDIAZEPOXIDE HCL 5 MG PO CAPS
5.0000 mg | ORAL_CAPSULE | Freq: Three times a day (TID) | ORAL | Status: DC
  Administered 2018-07-29 (×2): 5 mg via ORAL
  Filled 2018-07-29 (×2): qty 1

## 2018-07-29 NOTE — Progress Notes (Signed)
@IPLOG @        PROGRESS NOTE                                                                                                                                                                                                             Patient Demographics:    Leonard Bentley, is a 57 y.o. male, DOB - [birth year], IHK:[id number]  Admit date - 07/25/2018   Admitting Physician [name], MD  Outpatient Primary MD for the patient is Mathis Dad  LOS - 3  Chief Complaint  Patient presents with  . Chest Pain       Brief Narrative  Leonard Bentley is a 57 y.o. male with medical history significant of MVP; NHL; HTN; depression; ETOH dependence; chronic combined heart failure; and PE on Coumadin presenting with chest pain.  He started his AM really good but he was having difficulty standing up by the time he got out of the shower due to weakness in his legs and lack of balance.  Slight pinpoint chest pain.  +SOB, even while trying to get dressed.  He has been feeling anxious/nervous.  +tremors when he gets aggravated.  He endorses visual hallucinations.  His last drink was 3-4 days ago.  He wants to quit drinking.   Subjective:   Patient in bed, appears comfortable, denies any headache, no fever, no chest pain or pressure, no shortness of breath , no abdominal pain. No focal weakness.   Assessment  & Plan :     1.  Severe DTs.  Placed on Librium scheduled along with clonidine scheduled low-dose, continue CIWA protocol.  Counseled to quit alcohol.  He still has significant weakness and poor balance, working with PT, likely discharge in the next 1 to 2 days.  Will continue tapering off Librium.  Balance and tremors have improved working with PT, likely discharge in the next 1 to 2 days to home with supervision versus SNF.  2.  Mild alcoholic hepatitis.  Proving with supportive care.  3.  HX of PE.  On Coumadin.  INR supratherapeutic.  Trend improving we will continue to monitor, likely  skip Coumadin for the next few days.  Pharmacy has been consulted.  4.  Hypertension with tachycardia.  Continue to treat DTs, continue hydration, placed on clonidine and dose increased on 07/27/2018 for better control.  5.  HX of alcohol abuse and smoking.  NicoDerm patch, counseled to quit both.  CIWA protocol as a #1 above.  6.  Hypomagnesemia.  Replaced will  monitor.    Family Communication  :  None  Code Status :  Full  Disposition Plan  :  Med  Consults  :  None  Procedures  :  None  DVT Prophylaxis  : SCDs   Lab Results  Component Value Date   PLT 108 (L) 07/29/2018   Lab Results  Component Value Date   INR 2.17 07/29/2018   INR 3.93 07/28/2018   INR 5.43 (HH) 07/27/2018     Diet :  Diet Order            DIET SOFT Room service appropriate? Yes; Fluid consistency: Thin  Diet effective now               Inpatient Medications Scheduled Meds: . chlordiazePOXIDE  10 mg Oral TID  . cloNIDine  0.1 mg Oral BID  . docusate sodium  100 mg Oral BID  . folic acid  1 mg Oral Daily  . multivitamin with minerals  1 tablet Oral Daily  . nicotine  14 mg Transdermal Daily  . sodium chloride flush  3 mL Intravenous Q12H  . thiamine  100 mg Oral Daily  . Warfarin - Pharmacist Dosing Inpatient   Does not apply q1800   Continuous Infusions:  PRN Meds:.LORazepam, [DISCONTINUED] ondansetron **OR** ondansetron (ZOFRAN) IV, oxyCODONE  Antibiotics  :   Anti-infectives (From admission, onward)   None          Objective:   Vitals:   07/28/18 2027 07/28/18 2256 07/29/18 0616 07/29/18 0829  BP: (!) 115/99 (!) 111/96 109/70 113/87  Pulse: 61 63 68 66  Resp: 18  18   Temp: 98.2 F (36.8 C)  98.2 F (36.8 C) 98.3 F (36.8 C)  TempSrc: Oral  Oral Oral  SpO2: 100%  100% 100%  Weight:      Height:        Wt Readings from Last 3 Encounters:  07/25/18 93 kg  06/12/18 87.5 kg  03/29/18 85.2 kg     Intake/Output Summary (Last 24 hours) at 07/29/2018 1149 Last  data filed at 07/29/2018 1100 Gross per 24 hour  Intake 170 ml  Output 600 ml  Net -430 ml     Physical Exam  Awake Alert, Oriented X 3, No new F.N deficits, improving balance and tremors [place].AT,PERRAL Supple Neck,No JVD, No cervical lymphadenopathy appriciated.  Symmetrical Chest wall movement, Good air movement bilaterally, CTAB RRR,No Gallops, Rubs or new Murmurs, No Parasternal Heave +ve B.Sounds, Abd Soft, No tenderness, No organomegaly appriciated, No rebound - guarding or rigidity. No Cyanosis, Clubbing or edema, No new Rash or bruise     Data Review:    CBC Recent Labs  Lab 07/25/18 1230 07/26/18 0652 07/27/18 0458 07/28/18 0508 07/29/18 0517  WBC 3.6* 2.4* 3.0* 3.0* 3.4*  HGB 11.9* 10.6* 10.8* 10.2* 10.5*  HCT 35.1* 32.2* 32.6* 30.8* 31.4*  PLT 81* 67* 79* 86* 108*  MCV 78.3 79.1 79.9 79.6 79.9  MCH 26.6 26.0 26.5 26.4 26.7  MCHC 33.9 32.9 33.1 33.1 33.4  RDW 18.0* 17.7* 18.1* 18.1* 18.4*  LYMPHSABS 0.3*  --   --   --   --   MONOABS 0.5  --   --   --   --   EOSABS 0.0  --   --   --   --   BASOSABS 0.0  --   --   --   --     Chemistries  Recent Labs  Lab 07/25/18 1230  07/26/18 0347 07/27/18 0458 07/28/18 0508 07/29/18 0517  NA 137 138 140 140 138  K 3.4* 3.4* 3.4* 3.8 3.7  CL 93* 101 105 107 105  CO2 26 26 27 25 25   GLUCOSE 103* 85 109* 119* 115*  BUN 11 10 9 8 8   CREATININE 1.35* 0.95 0.92 0.79 0.84  CALCIUM 9.6 8.3* 8.2* 8.6* 8.7*  MG  --   --  1.1* 1.6*  --   AST 157* 109* 121* 115* 115*  ALT 79* 65* 73* 85* 95*  ALKPHOS 52 45 50 43 45  BILITOT 2.3* 1.7* 1.2 1.4* 1.4*   ------------------------------------------------------------------------------------------------------------------ No results for input(s): CHOL, HDL, LDLCALC, TRIG, CHOLHDL, LDLDIRECT in the last 72 hours.  Lab Results  Component Value Date   HGBA1C 5.2 10/25/2016    ------------------------------------------------------------------------------------------------------------------ No results for input(s): TSH, T4TOTAL, T3FREE, THYROIDAB in the last 72 hours.  Invalid input(s): FREET3 ------------------------------------------------------------------------------------------------------------------ No results for input(s): VITAMINB12, FOLATE, FERRITIN, TIBC, IRON, RETICCTPCT in the last 72 hours.  Coagulation profile Recent Labs  Lab 07/25/18 1230 07/26/18 0652 07/27/18 0458 07/28/18 0508 07/29/18 0517  INR 8.38* 6.51* 5.43* 3.93 2.17    No results for input(s): DDIMER in the last 72 hours.  Cardiac Enzymes No results for input(s): CKMB, TROPONINI, MYOGLOBIN in the last 168 hours.  Invalid input(s): CK ------------------------------------------------------------------------------------------------------------------    Component Value Date/Time   BNP 71.3 11/19/2016 1013    Micro Results Recent Results (from the past 240 hour(s))  MRSA PCR Screening     Status: None   Collection Time: 07/26/18  5:43 AM  Result Value Ref Range Status   MRSA by PCR NEGATIVE NEGATIVE Final    Comment:        The GeneXpert MRSA Assay (FDA approved for NASAL specimens only), is one component of a comprehensive MRSA colonization surveillance program. It is not intended to diagnose MRSA infection nor to guide or monitor treatment for MRSA infections. Performed at [facility], [place] [address], [place], [place] [address]     Radiology Reports Dg Chest 2 View  Result Date: 07/25/2018 CLINICAL DATA:  57 year old male with shortness of breath, palpitations. EXAM: CHEST - 2 VIEW COMPARISON:  CTA chest 06/05/2018 and earlier. FINDINGS: Stable cardiac and mediastinal contours with prostatic mitral valve. Lung volumes are within normal limits. Visualized tracheal air column is within normal limits. No pneumothorax, pleural effusion or consolidation.  Chronic pulmonary reticulonodular opacity, greater in the right lung, stable since 2017. No acute or confluent pulmonary opacity. Negative visible bowel gas pattern. No acute osseous abnormality identified. IMPRESSION: No acute findings. Chronic small miliary lung nodules, more apparent in the right lung. Electronically Signed   By: Genevie Ann M.D.   On: 07/25/2018 13:04    Time Spent in minutes  30   Lala Lund M.D on 07/29/2018 at 11:49 AM  To page go to [email or web address] - password [email or web address]

## 2018-07-29 NOTE — Progress Notes (Signed)
ANTICOAGULATION CONSULT NOTE - Follow Up Consult  Pharmacy Consult for Coumadin Indication: h/o PE  No Known Allergies  Patient Measurements: Height: 5\' 9"  (175.3 cm) Weight: 205 lb (93 kg) IBW/kg (Calculated) : 70.7  Vital Signs: Temp: 98.3 F (36.8 C) (09/21 0829) Temp Source: Oral (09/21 0829) BP: 113/87 (09/21 0829) Pulse Rate: 66 (09/21 0829)  Labs: Recent Labs    07/27/18 0458 07/28/18 0508 07/29/18 0517  HGB 10.8* 10.2* 10.5*  HCT 32.6* 30.8* 31.4*  PLT 79* 86* 108*  LABPROT 49.1* 38.2* 24.0*  INR 5.43* 3.93 2.17  CREATININE 0.92 0.79 0.84    Estimated Creatinine Clearance: 107.9 mL/min (by C-G formula based on SCr of 0.84 mg/dL).  Assessment:  Anticoag: warfarin for hx recurrent PE (12/28/12),  INR 8.38 on admit 9/17>LFTs up a little. No reversal given. INR down to 2.17. Hgb stable and plts improving 108. - PTA Coumadin: 5 mg daily except 7.5 mg on Mondays, with admit INR 8.28.  Goal of Therapy:  INR 2-3 Monitor platelets by anticoagulation protocol: Yes   Plan:  Resume Coumadin 6mg  po x 1 tonight (none since 9/16)  Daily PT/INR  Montrail Mehrer S. Alford Highland, PharmD, BCPS Clinical Staff Pharmacist  [address], [place] 07/29/2018,1:55 PM

## 2018-07-29 NOTE — Progress Notes (Signed)
Patient ID: Leonard Bentley, male   DOB: [birth year], 57 y.o.   MRN: [id number] Patient ambulated approximately 160 feet from his room with the assistance of the tech and a gate belt. Patient was very unsteady at the beginning of the walk but improved with distance. Patient tolerated the walk well.

## 2018-07-29 NOTE — Progress Notes (Signed)
Physical Therapy Treatment Patient Details Name: Leonard Bentley MRN: [id number] DOB: [birth year] Today's Date: 07/29/2018    History of Present Illness Leonard Bentley is a 57 y.o. male with medical history significant of MVP; NHL; HTN; depression; ETOH dependence; chronic combined heart failure; and PE on Coumadin presenting with chest pain.  He started his AM really good but he was having difficulty standing up by the time he got out of the shower due to weakness in his legs and lack of balance.  Slight pinpoint chest pain.  +SOB, even while trying to get dressed.  He has been feeling anxious/nervous.  +tremors when he gets aggravated.  He endorses visual hallucinations.  His last drink was 3-4+ days ago.  He wants to quit drinking.    PT Comments    Pt making fair progress with functional mobility; however, he remains very unsteady and appears to be at an increased risk for falls. Pt would greatly benefit from further intensive therapy services at a SNF prior to returning home. Pt would continue to benefit from skilled physical therapy services at this time while admitted and after d/c to address the below listed limitations in order to improve overall safety and independence with functional mobility.    Follow Up Recommendations  SNF;Supervision/Assistance - 24 hour     Equipment Recommendations  None recommended by PT    Recommendations for Other Services       Precautions / Restrictions Precautions Precautions: Fall Restrictions Weight Bearing Restrictions: No    Mobility  Bed Mobility               General bed mobility comments: pt OOB in recliner chair upon arrival  Transfers Overall transfer level: Needs assistance Equipment used: None Transfers: Sit to/from Stand Sit to Stand: Min guard         General transfer comment: min guard for safety as pt was tremulous and very unsteady  Ambulation/Gait Ambulation/Gait assistance: Min assist Gait Distance (Feet): 100  Feet Assistive device: None Gait Pattern/deviations: Step-through pattern;Decreased step length - right;Decreased step length - left;Decreased stride length;Ataxic;Drifts right/left Gait velocity: decreased Gait velocity interpretation: <1.31 ft/sec, indicative of household ambulator General Gait Details: pt ambulated in hallway without use of an AD per pt request. pt very unsteady and ataxic throughout requiring min A for balance   Stairs             Wheelchair Mobility    Modified Rankin (Stroke Patients Only)       Balance Overall balance assessment: Needs assistance Sitting-balance support: Feet supported Sitting balance-Leahy Scale: Fair     Standing balance support: During functional activity;No upper extremity supported Standing balance-Leahy Scale: Poor Standing balance comment: static is fair, dynamic is poor                            Cognition Arousal/Alertness: Awake/alert Behavior During Therapy: WFL for tasks assessed/performed Overall Cognitive Status: Impaired/Different from baseline Area of Impairment: Safety/judgement                         Safety/Judgement: Decreased awareness of deficits;Decreased awareness of safety            Exercises Total Joint Exercises Knee Flexion: AROM;Strengthening;Both;10 reps;Seated;Other (comment)(with therapist providing resistance) General Exercises - Lower Extremity Long Arc Quad: AROM;Strengthening;Both;10 reps;Other (comment)(with resistance provided by therapist) Mini-Sqauts: AROM;Strengthening;15 reps    General Comments  Pertinent Vitals/Pain Pain Assessment: No/denies pain    Home Living                      Prior Function            PT Goals (current goals can now be found in the care plan section) Acute Rehab PT Goals PT Goal Formulation: With patient Time For Goal Achievement: 08/10/18 Potential to Achieve Goals: Good Progress towards PT goals:  Progressing toward goals    Frequency    Min 3X/week      PT Plan Current plan remains appropriate    Co-evaluation              AM-PAC PT "6 Clicks" Daily Activity  Outcome Measure  Difficulty turning over in bed (including adjusting bedclothes, sheets and blankets)?: None Difficulty moving from lying on back to sitting on the side of the bed? : None Difficulty sitting down on and standing up from a chair with arms (e.g., wheelchair, bedside commode, etc,.)?: Unable Help needed moving to and from a bed to chair (including a wheelchair)?: None Help needed walking in hospital room?: A Little Help needed climbing 3-5 steps with a railing? : A Little 6 Click Score: 19    End of Session Equipment Utilized During Treatment: Gait belt Activity Tolerance: Patient tolerated treatment well Patient left: in chair;with call bell/phone within reach;with chair alarm set Nurse Communication: Mobility status PT Visit Diagnosis: Other abnormalities of gait and mobility (R26.89);Unsteadiness on feet (R26.81)     Time: 6256-3893 PT Time Calculation (min) (ACUTE ONLY): 23 min  Charges:  $Gait Training: 8-22 mins $Therapeutic Activity: 8-22 mins                     Sherie Don, [place], DPT  Acute Rehabilitation Services Pager [phone] Office [place] 07/29/2018, 12:53 PM

## 2018-07-30 DIAGNOSIS — F10231 Alcohol dependence with withdrawal delirium: Principal | ICD-10-CM

## 2018-07-30 LAB — COMPREHENSIVE METABOLIC PANEL
ALT: 86 U/L — ABNORMAL HIGH (ref 0–44)
AST: 82 U/L — ABNORMAL HIGH (ref 15–41)
Albumin: 3.1 g/dL — ABNORMAL LOW (ref 3.5–5.0)
Alkaline Phosphatase: 46 U/L (ref 38–126)
Anion gap: 9 (ref 5–15)
BUN: 14 mg/dL (ref 6–20)
CO2: 25 mmol/L (ref 22–32)
Calcium: 8.4 mg/dL — ABNORMAL LOW (ref 8.9–10.3)
Chloride: 102 mmol/L (ref 98–111)
Creatinine, Ser: 0.84 mg/dL (ref 0.61–1.24)
GFR calc Af Amer: >60 mL/min (ref >60)
GFR calc non Af Amer: >60 mL/min (ref >60)
Glucose, Bld: 99 mg/dL (ref 70–99)
Potassium: 3.5 mmol/L (ref 3.5–5.1)
Sodium: 136 mmol/L (ref 135–145)
Total Bilirubin: 1.4 mg/dL — ABNORMAL HIGH (ref 0.3–1.2)
Total Protein: 6 g/dL — ABNORMAL LOW (ref 6.5–8.1)

## 2018-07-30 LAB — CBC
HCT: 31.6 % — ABNORMAL LOW (ref 39.0–52.0)
Hemoglobin: 10.2 g/dL — ABNORMAL LOW (ref 13.0–17.0)
MCH: 26.2 pg (ref 26.0–34.0)
MCHC: 32.3 g/dL (ref 30.0–36.0)
MCV: 81.2 fL (ref 78.0–100.0)
Platelets: 156 10*3/uL (ref 150–400)
RBC: 3.89 MIL/uL — ABNORMAL LOW (ref 4.22–5.81)
RDW: 18.3 % — ABNORMAL HIGH (ref 11.5–15.5)
WBC: 3.8 10*3/uL — ABNORMAL LOW (ref 4.0–10.5)

## 2018-07-30 LAB — PROTIME-INR
INR: 1.38
Prothrombin Time: 16.9 seconds — ABNORMAL HIGH (ref 11.4–15.2)

## 2018-07-30 LAB — MAGNESIUM: Magnesium: 1.6 mg/dL — ABNORMAL LOW (ref 1.7–2.4)

## 2018-07-30 MED ORDER — NICOTINE 14 MG/24HR TD PT24: 14.0000 mg | MEDICATED_PATCH | Freq: Every day | TRANSDERMAL | 0 refills | Status: DC

## 2018-07-30 MED ORDER — FOLIC ACID 1 MG PO TABS: 1.0000 mg | ORAL_TABLET | Freq: Every day | ORAL | 0 refills | Status: DC

## 2018-07-30 MED ORDER — THIAMINE HCL 100 MG PO TABS: 100.0000 mg | ORAL_TABLET | Freq: Every day | ORAL | 0 refills | Status: DC

## 2018-07-30 MED ORDER — CHLORDIAZEPOXIDE HCL 5 MG PO CAPS: ORAL_CAPSULE | ORAL | 0 refills | Status: DC

## 2018-07-30 MED ORDER — CHLORDIAZEPOXIDE HCL 5 MG PO CAPS
5.0000 mg | ORAL_CAPSULE | Freq: Two times a day (BID) | ORAL | Status: DC
  Administered 2018-07-30: 5 mg via ORAL
  Filled 2018-07-30: qty 1

## 2018-07-30 MED ORDER — MAGNESIUM SULFATE 2 GM/50ML IV SOLN
2.0000 g | Freq: Once | INTRAVENOUS | Status: AC
  Administered 2018-07-30: 2 g via INTRAVENOUS
  Filled 2018-07-30: qty 50

## 2018-07-30 NOTE — Care Management Note (Signed)
Case Management Note  Patient Details  Name: Leonard Bentley MRN: [id number] Date of Birth: 07-Aug-1961  Subjective/Objective:     Pt presented for DT. Pt inappropriate for SNF due to substance abuse.  Pt is uninsured.                 Action/Plan: Jermaine with [facility] notified of patient needs for walker and [place] services.  RW will be delivered to room prior to d/c.  Charity application for [facility] will be initiated.   Expected Discharge Date:  07/30/18               Expected Discharge Plan:  [place]  In-House Referral:  NA  Discharge planning Services  CM Consult  Post Acute Care Choice:  Durable Medical Equipment, Home Health Choice offered to:  NA(Charity care is only through [place])  DME Arranged:  [name] rolling DME Agency:  [place]:  RN, PT, OT [facility] Agency:  [place]  Status of Service:  Completed, signed off  If discussed at [place] of Stay Meetings, dates discussed:    Additional Comments:  Claudie Leach, RN 07/30/2018, 11:39 AM

## 2018-07-30 NOTE — Progress Notes (Signed)
ANTICOAGULATION CONSULT NOTE - Follow Up Consult  Pharmacy Consult for Coumadin Indication: h/o PE  No Known Allergies  Patient Measurements: Height: 5\' 9"  (175.3 cm) Weight: 205 lb (93 kg) IBW/kg (Calculated) : 70.7  Vital Signs: Temp: 97.7 F (36.5 C) (09/22 1327) Temp Source: Oral (09/22 1327) BP: 91/70 (09/22 1327) Pulse Rate: 82 (09/22 1327)  Labs: Recent Labs    07/28/18 0508 07/29/18 0517 07/30/18 0518  HGB 10.2* 10.5* 10.2*  HCT 30.8* 31.4* 31.6*  PLT 86* 108* 156  LABPROT 38.2* 24.0* 16.9*  INR 3.93 2.17 1.38  CREATININE 0.79 0.84 0.84    Estimated Creatinine Clearance: 107.9 mL/min (by C-G formula based on SCr of 0.84 mg/dL).  Assessment:  Anticoag: warfarin for hx recurrent PE (12/28/12),  INR 8.38 on admit 9/17>LFTs up a little. No reversal given. INR down to 1.38. Dc home on home dose today and f/u with primary in 3d - PTA Coumadin: 5 mg daily except 7.5 mg on Mondays, with admit INR 8.28.  Goal of Therapy:  INR 2-3 Monitor platelets by anticoagulation protocol: Yes   Plan:   Dc on home dose then f/u with primary in 3d  Calina Patrie, PharmD, [place], AAHIVP, CPP Infectious Disease Pharmacist Pager: [phone] 07/30/2018 2:00 PM

## 2018-07-30 NOTE — Progress Notes (Signed)
Nsg Discharge Note  Admit Date:  07/25/2018 Discharge date: 07/30/2018   Richardson Landry A [place] to be D/C'd Home per MD order.  AVS completed.  Copy for chart, and copy for patient signed, and dated. Patient/caregiver able to verbalize understanding.  Discharge Medication: Allergies as of 07/30/2018   No Known Allergies     Medication List    TAKE these medications   chlordiazePOXIDE 5 MG capsule Commonly known as:  LIBRIUM Please dispense 9 pills -  Take 1 pill two times a [place] for 3 days, then Take 1 pill once a [place] for 3 days and stop.   folic acid 1 MG tablet Commonly known as:  FOLVITE Take 1 tablet (1 mg total) by mouth daily. Start taking on:  07/31/2018   metoprolol tartrate 25 MG tablet Commonly known as:  LOPRESSOR Take 1 tablet (25 mg total) by mouth 2 (two) times daily.   nicotine 14 mg/24hr patch Commonly known as:  NICODERM CQ - dosed in mg/24 hours Place 1 patch (14 mg total) onto the skin daily. Start taking on:  07/31/2018 Notes to patient:  Remove patch from right arm. Please alternate patches between locations. Leave patch on for 24 hours.    REDNESS RELIEVER EYE DROPS 0.05 % ophthalmic solution Generic drug:  tetrahydrozoline Place 1 drop into both eyes daily as needed (red eyes).   thiamine 100 MG tablet Take 1 tablet (100 mg total) by mouth daily. Start taking on:  07/31/2018   triamcinolone cream 0.1 % Commonly known as:  KENALOG Apply 1 application topically 2 (two) times daily.   warfarin 5 MG tablet Commonly known as:  COUMADIN Take as directed. If you are unsure how to take this medication, talk to your nurse or doctor. Original instructions:  Take 5-7.5 mg by mouth daily. 5 mg daily except 7.5 mg on Mondays            Durable Medical Equipment  (From admission, onward)         Start     Ordered   07/30/18 1046  For home use only DME Walker rolling  Once    Question:  Patient needs a walker to treat with the following condition  Answer:   Weakness   07/30/18 1045          Discharge Assessment: Vitals:   07/29/18 2136 07/30/18 0553  BP: (!) 130/96 114/81  Pulse: 69 (!) 57  Resp: 18 18  Temp: 98.5 F (36.9 C) 98.7 F (37.1 C)  SpO2: 100% 100%   Skin clean, dry and intact without evidence of skin break down, no evidence of skin tears noted. IV catheter discontinued intact. Site without signs and symptoms of complications - no redness or edema noted at insertion site, patient denies c/o pain - only slight tenderness at site.  Dressing with slight pressure applied.  D/c Instructions-Education: Discharge instructions given to patient/family with verbalized understanding. D/c education completed with patient/family including follow up instructions, medication list, d/c activities limitations if indicated, with other d/c instructions as indicated by MD - patient able to verbalize understanding, all questions fully answered. Patient instructed to return to ED, call 911, or call MD for any changes in condition.  Patient escorted via [place], and D/C home via private auto.  Hiram Comber, RN 07/30/2018 11:21 AM

## 2018-07-30 NOTE — Discharge Summary (Signed)
[name] UDJ:[id number] DOB: 1961-06-02 DOA: 07/25/2018  PCP: Argentina Donovan, PA-C  Admit date: 07/25/2018  Discharge date: 07/30/2018  Admitted From: Home   Disposition:  Home   Recommendations for Outpatient Follow-up:   Follow up with PCP in 1-2 weeks  PCP Please obtain BMP/CBC, 2 view CXR in 1week,  (see Discharge instructions)   PCP Please follow up on the following pending results:    Home Health: PT,RN   Equipment/Devices: Rolling walker  Consultations: None Discharge Condition: Stable   CODE STATUS: Full   Diet Recommendation: Heart Healthy    Diet Order            Diet Heart Room service appropriate? Yes; Fluid consistency: Thin  Diet effective now               Chief Complaint  Patient presents with  . Chest Pain     Brief history of present illness from the [place] of admission and additional interim summary    Leonard Bentley a 57 y.o.malewith medical history significant ofMVP; NHL; HTN; depression; ETOH dependence; chronic combined heart failure; and PE on Coumadin presenting with chest pain.He started his AM really good but he was having difficulty standing up by the time he got out of the shower due to weakness in his legs and lack of balance. Slight pinpoint chest pain. +SOB, even while trying to get dressed. He has been feeling anxious/nervous. +tremors when he gets aggravated. He endorses visual hallucinations.His last drink was 3-4 days ago. He wants to quit drinking.                                                                 Hospital Course    1.  Severe DTs.    He was counseled to quit alcohol, he was placed on scheduled Librium along with CIWA protocol with good results, DTs almost completely resolved, balance has considerably improved.  Does not want to go to  SNF and wants to be discharged home today as he is close to baseline, will be discharged on folic acid and thiamine along with a low-dose Librium taper, does not want referral to AA.  Home PT and RN also ordered upon discharge along with a rolling walker.  2.  Mild alcoholic hepatitis.    Improved with supportive care, follow with PCP.  3.  HX of PE.  On Coumadin.  INR was supratherapeutic upon admission now improved, requested to resume home Coumadin dose and follow with PCP in 3 days for repeat INR check.  4.  Hypertension with tachycardia.  Due to DTs resolved completely.  5.  HX of alcohol abuse and smoking. Counseled to quit both.  CIWA protocol as a #1 above.  6.  Hypomagnesemia.    Replaced, PCP to recheck  and monitor.   Discharge diagnosis     Principal Problem:   Delirium tremens ([place]) Active Problems:   Cigarette smoker   History of pulmonary embolus (PE)   Elevated INR   Alcoholic hepatitis without ascites   Prolonged Q-T interval on ECG    Discharge instructions    Discharge Instructions    Discharge instructions   Complete by:  As directed    Follow with Primary MD Argentina Donovan, PA-C in 3 days   Get CBC, CMP, Magnesium, INR checked  by Primary MD in 3 days   Activity: As tolerated with Full fall precautions use walker/cane & assistance as needed  Disposition Home    Diet: Heart Healthy    For Heart failure patients - Check your Weight same time everyday, if you gain over 2 pounds, or you develop in leg swelling, experience more shortness of breath or chest pain, call your Primary MD immediately. Follow Cardiac Low Salt Diet and 1.5 lit/[name].  Special Instructions: If you have smoked or chewed Tobacco  in the last 2 yrs please stop smoking, stop any regular Alcohol  and or any Recreational drug use.  On your next visit with your primary care physician please Get Medicines reviewed and adjusted.  Please request your Prim.MD to go  over all Hospital Tests and Procedure/Radiological results at the follow up, please get all Hospital records sent to your Prim MD by signing hospital release before you go home.  If you experience worsening of your admission symptoms, develop shortness of breath, life threatening emergency, suicidal or homicidal thoughts you must seek medical attention immediately by calling 911 or calling your MD immediately  if symptoms less severe.  You Must read complete instructions/literature along with all the possible adverse reactions/side effects for all the Medicines you take and that have been prescribed to you. Take any new Medicines after you have completely understood and accpet all the possible adverse reactions/side effects.   Do not drive when taking Pain medications or on Librium. Do not take more than prescribed Pain, Sleep and Anxiety Medications   Increase activity slowly   Complete by:  As directed       Discharge Medications   Allergies as of 07/30/2018   No Known Allergies     Medication List    TAKE these medications   chlordiazePOXIDE 5 MG capsule Commonly known as:  LIBRIUM Please dispense 9 pills -  Take 1 pill two times a [place] for 3 days, then Take 1 pill once a B[place] for 3 days and stop.   folic acid 1 MG tablet Commonly known as:  FOLVITE Take 1 tablet (1 mg total) by mouth daily. Start taking on:  07/31/2018   metoprolol tartrate 25 MG tablet Commonly known as:  LOPRESSOR Take 1 tablet (25 mg total) by mouth 2 (two) times daily.   nicotine 14 mg/24hr patch Commonly known as:  NICODERM CQ - dosed in mg/24 hours Place 1 patch (14 mg total) onto the skin daily. Start taking on:  07/31/2018   REDNESS RELIEVER EYE DROPS 0.05 % ophthalmic solution Generic drug:  tetrahydrozoline Place 1 drop into both eyes daily as needed (red eyes).   thiamine 100 MG tablet Take 1 tablet (100 mg total) by mouth daily. Start taking on:  07/31/2018   triamcinolone cream 0.1  % Commonly known as:  KENALOG Apply 1 application topically 2 (two) times daily.   warfarin 5 MG tablet Commonly known as:  COUMADIN  Take as directed. If you are unsure how to take this medication, talk to your nurse or doctor. Original instructions:  Take 5-7.5 mg by mouth daily. 5 mg daily except 7.5 mg on Mondays            Durable Medical Equipment  (From admission, onward)         Start     Ordered   07/30/18 1046  For home use only DME Walker rolling  Once    Question:  Patient needs a walker to treat with the following condition  Answer:  Weakness   07/30/18 1045          Follow-up Information    [facility]. Call on 07/31/2018.   Why:  call to arrange hospital follow up appointment on 07/31/2018 at 8:30 am Contact information: [address] [place] [address] [phone]          Major procedures and Radiology Reports - PLEASE review detailed and final reports thoroughly  -        Dg Chest 2 View  Result Date: 07/25/2018 CLINICAL DATA:  57 year old male with shortness of breath, palpitations. EXAM: CHEST - 2 VIEW COMPARISON:  CTA chest 06/05/2018 and earlier. FINDINGS: Stable cardiac and mediastinal contours with prostatic mitral valve. Lung volumes are within normal limits. Visualized tracheal air column is within normal limits. No pneumothorax, pleural effusion or consolidation. Chronic pulmonary reticulonodular opacity, greater in the right lung, stable since 2017. No acute or confluent pulmonary opacity. Negative visible bowel gas pattern. No acute osseous abnormality identified. IMPRESSION: No acute findings. Chronic small miliary lung nodules, more apparent in the right lung. Electronically Signed   By: Genevie Ann M.D.   On: 07/25/2018 13:04    Micro Results    Recent Results (from the past 240 hour(s))  MRSA PCR Screening     Status: None   Collection Time: 07/26/18  5:43 AM  Result Value Ref  Range Status   MRSA by PCR NEGATIVE NEGATIVE Final    Comment:        The GeneXpert MRSA Assay (FDA approved for NASAL specimens only), is one component of a comprehensive MRSA colonization surveillance program. It is not intended to diagnose MRSA infection nor to guide or monitor treatment for MRSA infections. Performed at [facility], [place] [address] [place], [place] [address]     Today   Subjective    Leonard Bentley today has no headache,no chest abdominal pain,no new weakness tingling or numbness, feels much better wants to go home today.    Objective   Blood pressure 114/81, pulse (!) 57, temperature 98.7 F (37.1 C), temperature source Oral, resp. rate 18, height 5\' 9"  (1.753 m), weight 93 kg, SpO2 100 %.   Intake/Output Summary (Last 24 hours) at 07/30/2018 1046 Last data filed at 07/30/2018 0746 Gross per 24 hour  Intake 450 ml  Output 300 ml  Net 150 ml    Exam Awake Alert, Oriented x 3, No new F.N deficits, Normal affect Orient.AT,PERRAL Supple Neck,No JVD, No cervical lymphadenopathy appriciated.  Symmetrical Chest wall movement, Good air movement bilaterally, CTAB RRR,No Gallops,Rubs or new Murmurs, No Parasternal Heave +ve B.Sounds, Abd Soft, Non tender, No organomegaly appriciated, No rebound -guarding or rigidity. No Cyanosis, Clubbing or edema, No new Rash or bruise   Data Review   CBC w Diff:  Lab Results  Component Value Date   WBC 3.8 (L) 07/30/2018   HGB  10.2 (L) 07/30/2018   HGB 12.8 (L) 09/21/2017   HCT 31.6 (L) 07/30/2018   HCT 40.2 09/21/2017   PLT 156 07/30/2018   PLT 221 09/21/2017   PLT 158 04/22/2016   LYMPHOPCT 7 07/25/2018   LYMPHOPCT 17.5 09/21/2017   MONOPCT 15 07/25/2018   MONOPCT 15.6 (H) 09/21/2017   EOSPCT 1 07/25/2018   EOSPCT 6.4 09/21/2017   BASOPCT 1 07/25/2018   BASOPCT 0.6 09/21/2017    CMP:  Lab Results  Component Value Date   NA 136 07/30/2018   NA 140 09/21/2017   K 3.5 07/30/2018   K 4.5 09/21/2017    CL 102 07/30/2018   CL 98 12/28/2012   CO2 25 07/30/2018   CO2 27 09/21/2017   BUN 14 07/30/2018   BUN 18.1 09/21/2017   CREATININE 0.84 07/30/2018   CREATININE 1.0 09/21/2017   PROT 6.0 (L) 07/30/2018   PROT 8.0 09/21/2017   ALBUMIN 3.1 (L) 07/30/2018   ALBUMIN 3.7 09/21/2017   BILITOT 1.4 (H) 07/30/2018   BILITOT 0.62 09/21/2017   ALKPHOS 46 07/30/2018   ALKPHOS 63 09/21/2017   AST 82 (H) 07/30/2018   AST 58 (H) 09/21/2017   ALT 86 (H) 07/30/2018   ALT 37 09/21/2017  .   Total Time in preparing paper work, data evaluation and todays exam - 67 minutes  Lala Lund M.D on 07/30/2018 at 10:46 AM  [facility]   Office  [phone]

## 2018-07-30 NOTE — Progress Notes (Signed)
Patient ambulated 175 feet in hallway. Patient unsteady at times but required minimal assistance from staff. Patient tolerated walk well.

## 2018-08-15 ENCOUNTER — Telehealth: Payer: Self-pay | Admitting: Pharmacist Clinician (PhC)/ Clinical Pharmacy Specialist

## 2018-08-15 NOTE — Telephone Encounter (Signed)
Pt on warfarin, last INR in May 2019, [facility] for him to return call

## 2018-08-17 NOTE — Telephone Encounter (Signed)
Appointment scheduled for Monday.  Patient without insurance, will check [place] x 1 while he works out Insurance underwriter

## 2018-08-30 ENCOUNTER — Ambulatory Visit: Payer: Self-pay | Attending: Nurse Practitioner | Admitting: Nurse Practitioner

## 2018-08-30 ENCOUNTER — Other Ambulatory Visit: Payer: Self-pay

## 2018-08-30 ENCOUNTER — Encounter (HOSPITAL_COMMUNITY): Payer: Self-pay | Admitting: Emergency Medicine

## 2018-08-30 ENCOUNTER — Inpatient Hospital Stay (HOSPITAL_COMMUNITY): Payer: Self-pay

## 2018-08-30 ENCOUNTER — Inpatient Hospital Stay (HOSPITAL_COMMUNITY)
Admission: EM | Admit: 2018-08-30 | Discharge: 2018-08-31 | DRG: 894 | Discharge status: Against Medical Advice | Payer: Self-pay | Attending: Internal Medicine | Admitting: Internal Medicine

## 2018-08-30 ENCOUNTER — Encounter: Payer: Self-pay | Admitting: Nurse Practitioner

## 2018-08-30 ENCOUNTER — Encounter (HOSPITAL_COMMUNITY): Payer: Self-pay

## 2018-08-30 VITALS — BP 122/82 | HR 164 | Temp 97.8°F | Ht 69.5 in | Wt 187.4 lb

## 2018-08-30 DIAGNOSIS — Z5329 Procedure and treatment not carried out because of patient's decision for other reasons: Secondary | ICD-10-CM | POA: Diagnosis not present

## 2018-08-30 DIAGNOSIS — R220 Localized swelling, mass and lump, head: Secondary | ICD-10-CM | POA: Insufficient documentation

## 2018-08-30 DIAGNOSIS — Z8249 Family history of ischemic heart disease and other diseases of the circulatory system: Secondary | ICD-10-CM | POA: Insufficient documentation

## 2018-08-30 DIAGNOSIS — F10930 Alcohol use, unspecified with withdrawal, uncomplicated: Secondary | ICD-10-CM

## 2018-08-30 DIAGNOSIS — E860 Dehydration: Secondary | ICD-10-CM | POA: Diagnosis present

## 2018-08-30 DIAGNOSIS — Z7901 Long term (current) use of anticoagulants: Secondary | ICD-10-CM

## 2018-08-30 DIAGNOSIS — R9431 Abnormal electrocardiogram [ECG] [EKG]: Secondary | ICD-10-CM | POA: Diagnosis present

## 2018-08-30 DIAGNOSIS — I444 Left anterior fascicular block: Secondary | ICD-10-CM | POA: Insufficient documentation

## 2018-08-30 DIAGNOSIS — Z952 Presence of prosthetic heart valve: Secondary | ICD-10-CM

## 2018-08-30 DIAGNOSIS — T451X5A Adverse effect of antineoplastic and immunosuppressive drugs, initial encounter: Secondary | ICD-10-CM | POA: Diagnosis present

## 2018-08-30 DIAGNOSIS — R000 Tachycardia, unspecified: Secondary | ICD-10-CM | POA: Diagnosis present

## 2018-08-30 DIAGNOSIS — Z598 Other problems related to housing and economic circumstances: Secondary | ICD-10-CM

## 2018-08-30 DIAGNOSIS — Z9114 Patient's other noncompliance with medication regimen: Secondary | ICD-10-CM

## 2018-08-30 DIAGNOSIS — I493 Ventricular premature depolarization: Secondary | ICD-10-CM | POA: Insufficient documentation

## 2018-08-30 DIAGNOSIS — Z8572 Personal history of non-Hodgkin lymphomas: Secondary | ICD-10-CM

## 2018-08-30 DIAGNOSIS — Z79899 Other long term (current) drug therapy: Secondary | ICD-10-CM | POA: Insufficient documentation

## 2018-08-30 DIAGNOSIS — F419 Anxiety disorder, unspecified: Secondary | ICD-10-CM | POA: Insufficient documentation

## 2018-08-30 DIAGNOSIS — F1010 Alcohol abuse, uncomplicated: Secondary | ICD-10-CM | POA: Diagnosis present

## 2018-08-30 DIAGNOSIS — Z86711 Personal history of pulmonary embolism: Secondary | ICD-10-CM | POA: Diagnosis present

## 2018-08-30 DIAGNOSIS — I110 Hypertensive heart disease with heart failure: Secondary | ICD-10-CM | POA: Diagnosis present

## 2018-08-30 DIAGNOSIS — I440 Atrioventricular block, first degree: Secondary | ICD-10-CM | POA: Diagnosis present

## 2018-08-30 DIAGNOSIS — I5042 Chronic combined systolic (congestive) and diastolic (congestive) heart failure: Secondary | ICD-10-CM | POA: Diagnosis present

## 2018-08-30 DIAGNOSIS — K047 Periapical abscess without sinus: Secondary | ICD-10-CM | POA: Diagnosis present

## 2018-08-30 DIAGNOSIS — R791 Abnormal coagulation profile: Secondary | ICD-10-CM | POA: Diagnosis present

## 2018-08-30 DIAGNOSIS — F17210 Nicotine dependence, cigarettes, uncomplicated: Secondary | ICD-10-CM | POA: Diagnosis present

## 2018-08-30 DIAGNOSIS — R251 Tremor, unspecified: Secondary | ICD-10-CM | POA: Insufficient documentation

## 2018-08-30 DIAGNOSIS — F10239 Alcohol dependence with withdrawal, unspecified: Secondary | ICD-10-CM | POA: Diagnosis present

## 2018-08-30 DIAGNOSIS — G620 Drug-induced polyneuropathy: Secondary | ICD-10-CM | POA: Diagnosis present

## 2018-08-30 DIAGNOSIS — R079 Chest pain, unspecified: Secondary | ICD-10-CM

## 2018-08-30 DIAGNOSIS — F410 Panic disorder [episodic paroxysmal anxiety] without agoraphobia: Secondary | ICD-10-CM | POA: Insufficient documentation

## 2018-08-30 DIAGNOSIS — M1990 Unspecified osteoarthritis, unspecified site: Secondary | ICD-10-CM | POA: Diagnosis present

## 2018-08-30 DIAGNOSIS — F329 Major depressive disorder, single episode, unspecified: Secondary | ICD-10-CM | POA: Diagnosis present

## 2018-08-30 DIAGNOSIS — I10 Essential (primary) hypertension: Secondary | ICD-10-CM | POA: Insufficient documentation

## 2018-08-30 DIAGNOSIS — F10230 Alcohol dependence with withdrawal, uncomplicated: Secondary | ICD-10-CM

## 2018-08-30 DIAGNOSIS — I471 Supraventricular tachycardia: Secondary | ICD-10-CM | POA: Diagnosis present

## 2018-08-30 DIAGNOSIS — F10231 Alcohol dependence with withdrawal delirium: Principal | ICD-10-CM | POA: Diagnosis present

## 2018-08-30 DIAGNOSIS — F10939 Alcohol use, unspecified with withdrawal, unspecified: Secondary | ICD-10-CM | POA: Diagnosis present

## 2018-08-30 LAB — COMPREHENSIVE METABOLIC PANEL
ALT: 47 U/L — ABNORMAL HIGH (ref 0–44)
AST: 119 U/L — ABNORMAL HIGH (ref 15–41)
Albumin: 4.3 g/dL (ref 3.5–5.0)
Alkaline Phosphatase: 57 U/L (ref 38–126)
Anion gap: 12 (ref 5–15)
BUN: 8 mg/dL (ref 6–20)
CO2: 23 mmol/L (ref 22–32)
Calcium: 9.3 mg/dL (ref 8.9–10.3)
Chloride: 100 mmol/L (ref 98–111)
Creatinine, Ser: 1.04 mg/dL (ref 0.61–1.24)
GFR calc Af Amer: >60 mL/min (ref >60)
GFR calc non Af Amer: >60 mL/min (ref >60)
Glucose, Bld: 95 mg/dL (ref 70–99)
Potassium: 4.9 mmol/L (ref 3.5–5.1)
Sodium: 135 mmol/L (ref 135–145)
Total Bilirubin: 1.4 mg/dL — ABNORMAL HIGH (ref 0.3–1.2)
Total Protein: 7.3 g/dL (ref 6.5–8.1)

## 2018-08-30 LAB — PROTIME-INR
INR: >10
Prothrombin Time: >90 seconds — ABNORMAL HIGH (ref 11.4–15.2)

## 2018-08-30 LAB — MAGNESIUM: Magnesium: 1.6 mg/dL — ABNORMAL LOW (ref 1.7–2.4)

## 2018-08-30 LAB — CBC
HCT: 38.2 % — ABNORMAL LOW (ref 39.0–52.0)
Hemoglobin: 12.2 g/dL — ABNORMAL LOW (ref 13.0–17.0)
MCH: 25.4 pg — ABNORMAL LOW (ref 26.0–34.0)
MCHC: 31.9 g/dL (ref 30.0–36.0)
MCV: 79.6 fL — ABNORMAL LOW (ref 80.0–100.0)
Platelets: UNDETERMINED 10*3/uL (ref 150–400)
RBC: 4.8 MIL/uL (ref 4.22–5.81)
RDW: 18.5 % — ABNORMAL HIGH (ref 11.5–15.5)
WBC: 3.5 10*3/uL — ABNORMAL LOW (ref 4.0–10.5)
nRBC: 2 % — ABNORMAL HIGH (ref 0.0–0.2)

## 2018-08-30 LAB — RAPID URINE DRUG SCREEN, HOSP PERFORMED
Amphetamines: NOT DETECTED
Barbiturates: NOT DETECTED
Benzodiazepines: POSITIVE — AB
Cocaine: NOT DETECTED
Opiates: NOT DETECTED
Tetrahydrocannabinol: NOT DETECTED

## 2018-08-30 LAB — TROPONIN I: Troponin I: <0.03 ng/mL (ref <0.03)

## 2018-08-30 LAB — PHOSPHORUS: Phosphorus: 3.8 mg/dL (ref 2.5–4.6)

## 2018-08-30 LAB — TSH: TSH: 0.806 u[IU]/mL (ref 0.350–4.500)

## 2018-08-30 LAB — ETHANOL: Alcohol, Ethyl (B): 10 mg/dL — ABNORMAL HIGH (ref <10)

## 2018-08-30 MED ORDER — NICOTINE 14 MG/24HR TD PT24
14.0000 mg | MEDICATED_PATCH | Freq: Every day | TRANSDERMAL | Status: DC
  Administered 2018-08-31: 14 mg via TRANSDERMAL
  Filled 2018-08-30: qty 1

## 2018-08-30 MED ORDER — THIAMINE HCL 100 MG/ML IJ SOLN
100.0000 mg | Freq: Every day | INTRAMUSCULAR | Status: DC
  Filled 2018-08-30: qty 2

## 2018-08-30 MED ORDER — VITAMIN B-1 100 MG PO TABS
100.0000 mg | ORAL_TABLET | Freq: Every day | ORAL | Status: DC
  Administered 2018-08-30 – 2018-08-31 (×2): 100 mg via ORAL
  Filled 2018-08-30 (×2): qty 1

## 2018-08-30 MED ORDER — HYDROCODONE-ACETAMINOPHEN 5-325 MG PO TABS: 1.0000 | ORAL_TABLET | ORAL | Status: DC | PRN

## 2018-08-30 MED ORDER — METOPROLOL TARTRATE 25 MG PO TABS
25.0000 mg | ORAL_TABLET | Freq: Two times a day (BID) | ORAL | Status: DC
  Administered 2018-08-31 (×3): 25 mg via ORAL
  Filled 2018-08-30 (×3): qty 1

## 2018-08-30 MED ORDER — ONDANSETRON HCL 4 MG/2ML IJ SOLN: 4.0000 mg | Freq: Four times a day (QID) | INTRAMUSCULAR | Status: DC | PRN

## 2018-08-30 MED ORDER — LORAZEPAM 2 MG/ML IJ SOLN: 0.0000 mg | Freq: Two times a day (BID) | INTRAMUSCULAR | Status: DC

## 2018-08-30 MED ORDER — PHYTONADIONE 5 MG PO TABS
5.0000 mg | ORAL_TABLET | Freq: Once | ORAL | Status: AC
  Administered 2018-08-30: 5 mg via ORAL
  Filled 2018-08-30: qty 1

## 2018-08-30 MED ORDER — LORAZEPAM 1 MG PO TABS
0.0000 mg | ORAL_TABLET | Freq: Four times a day (QID) | ORAL | Status: DC
  Administered 2018-08-30 (×2): 1 mg via ORAL
  Administered 2018-08-31: 2 mg via ORAL
  Filled 2018-08-30 (×3): qty 1
  Filled 2018-08-30: qty 2

## 2018-08-30 MED ORDER — LORAZEPAM 1 MG PO TABS: 0.0000 mg | ORAL_TABLET | Freq: Two times a day (BID) | ORAL | Status: DC

## 2018-08-30 MED ORDER — IOHEXOL 300 MG/ML  SOLN
75.0000 mL | Freq: Once | INTRAMUSCULAR | Status: AC | PRN
  Administered 2018-08-30: 75 mL via INTRAVENOUS

## 2018-08-30 MED ORDER — ONDANSETRON HCL 4 MG PO TABS: 4.0000 mg | ORAL_TABLET | Freq: Four times a day (QID) | ORAL | Status: DC | PRN

## 2018-08-30 MED ORDER — SODIUM CHLORIDE 0.9 % IV SOLN
INTRAVENOUS | Status: DC
  Administered 2018-08-31: 01:00:00 via INTRAVENOUS

## 2018-08-30 MED ORDER — LORAZEPAM 2 MG/ML IJ SOLN
0.0000 mg | Freq: Four times a day (QID) | INTRAMUSCULAR | Status: DC
  Filled 2018-08-30: qty 1

## 2018-08-30 MED ORDER — AMOXICILLIN-POT CLAVULANATE 875-125 MG PO TABS
1.0000 | ORAL_TABLET | Freq: Two times a day (BID) | ORAL | Status: DC
  Administered 2018-08-30 – 2018-08-31 (×3): 1 via ORAL
  Filled 2018-08-30 (×3): qty 1

## 2018-08-30 MED ORDER — MAGNESIUM SULFATE 2 GM/50ML IV SOLN
2.0000 g | Freq: Once | INTRAVENOUS | Status: AC
  Administered 2018-08-30: 2 g via INTRAVENOUS
  Filled 2018-08-30: qty 50

## 2018-08-30 MED ORDER — ACETAMINOPHEN 650 MG RE SUPP: 650.0000 mg | Freq: Four times a day (QID) | RECTAL | Status: DC | PRN

## 2018-08-30 MED ORDER — ACETAMINOPHEN 325 MG PO TABS: 650.0000 mg | ORAL_TABLET | Freq: Four times a day (QID) | ORAL | Status: DC | PRN

## 2018-08-30 NOTE — ED Notes (Signed)
Admitting just left room

## 2018-08-30 NOTE — Progress Notes (Addendum)
Assessment & Plan:  Leonard Bentley was seen today for hospitalization follow-up.  Diagnoses and all orders for this visit:  Tachycardia Sent to ED by ambulance  Patient has been counseled on age-appropriate routine health concerns for screening and prevention. These are reviewed and up-to-date. Referrals have been placed accordingly. Immunizations are up-to-date or declined.    Subjective:   Chief Complaint  Patient presents with  . Hospitalization Follow-up    Pt. is here for HFU. Pt. stated he have a lump on the side of his face and his feet feel numb.    HPI Leonard Bentley 57 y.o. male presents to office today to establish care. He has a history of PE (on coumadin), MVR, depression, HTN, anxiety with panic attacks, ETOH abuse, and NHL (remission since 2012). Currently seeing cardiology however he lost his job and is now uninsured. Last PT/INR check was 07-30-2018 16.9/1.38 during a hospital admission related to  DTs secondary to alcohol withdrawal. At the time of discharge he declined referral to [facility] and SNF.   Today Leonard Bentley is experiencing severe tremulousness of the bilateral upper extremities. Tremors are so severe that they are preventing accurate readings from BP monitor. Manual pulse is 164. EKG shows sinus tachycardia 116. He does endorses increased shortness of breath but denies chest pain. He is compliant with coumadin. Due to his history of PE, ETOH abuse (he is aware he should not consume alcohol and take coumadin), cardiac history and tremors; he was transferred by ambulance to the hospital. He is not taking librium or thiamine.  He reports his last drink was yesterday. The longest time frame he has not had a drink was 2 weeks.  Patient has been advised to apply for financial assistance and schedule to see our financial counselor.   Review of Systems  Constitutional: Negative for fever, malaise/fatigue and weight loss.  HENT: Negative.  Negative for nosebleeds.   Eyes: Negative.   Negative for blurred vision, double vision and photophobia.  Respiratory: Positive for shortness of breath. Negative for cough.   Cardiovascular: Negative.  Negative for chest pain, palpitations and leg swelling.  Gastrointestinal: Negative.  Negative for heartburn, nausea and vomiting.  Musculoskeletal: Negative.  Negative for myalgias.  Skin:       Soft tissue mass on right temple  Neurological: Negative.  Negative for dizziness, focal weakness, seizures and headaches.  Psychiatric/Behavioral: Positive for depression. Negative for suicidal ideas. The patient is nervous/anxious.     Past Medical History:  Diagnosis Date  . Acute pulmonary embolism ([place]) 04/11/2016  . Anxiety   . Arthritis   . Atrial tachycardia ([place]) 01/06/2016  . Depression   . DTs (delirium tremens) ([place])   . Dyspnea   . ED (erectile dysfunction)   . Hypertension   . Lymphoma, non Hodgkin's 09/25/2011   Stage 2  . Mitral regurgitation 01/20/2016  . Mobitz I 10/09/2015  . Murmur 01/06/2016  . Occasional tremors   . PAC (premature atrial contraction) 10/09/2015  . S/P minimally-invasive mitral valve repair 10/27/2016   Complex valvuloplasty including artificial Gore-tex neochord placement x6 and 34 mm Edwards Physio II ring annuloplasty via right mini thoracotomy approach    Past Surgical History:  Procedure Laterality Date  . CARDIAC CATHETERIZATION N/A 08/13/2016   Procedure: Right/Left Heart Cath and Coronary Angiography;  Surgeon: [name], MD;  Location: [facility];  Service: Cardiovascular;  Laterality: N/A;  . MITRAL VALVE REPAIR Right 10/27/2016   Procedure: MINIMALLY INVASIVE MITRAL VALVE  REPAIR (MVR) USING 34 PHYSIO II ANNULOPLASTY RING;  Surgeon: [name], MD;  Location: [facility];  Service: Open Heart Surgery;  Laterality: Right;  . NO PAST SURGERIES    . SKIN BIOPSY Right 04/04/2018   right mid medial anterior tibial shave  see report in chart  . TEE WITHOUT CARDIOVERSION N/A  02/11/2016   Procedure: TRANSESOPHAGEAL ECHOCARDIOGRAM (TEE);  Surgeon: [name], MD;  Location: [facility];  Service: Cardiovascular;  Laterality: N/A;  . TEE WITHOUT CARDIOVERSION N/A 10/27/2016   Procedure: TRANSESOPHAGEAL ECHOCARDIOGRAM (TEE);  Surgeon: [name], MD;  Location: [facility];  Service: Open Heart Surgery;  Laterality: N/A;  . TRIGGER FINGER RELEASE Bilateral     Family History  Problem Relation Age of Onset  . Cancer Mother        BREAST(BONE)  . Cancer Father        PANCREATIC  . Hypertension Maternal Grandmother   . Stroke Maternal Aunt   . Heart attack Neg Hx     Social History Reviewed with no changes to be made today.   Outpatient Medications Prior to Visit  Medication Sig Dispense Refill  . chlordiazePOXIDE (LIBRIUM) 5 MG capsule Please dispense 9 pills -  Take 1 pill two times a [place]ke for 3 days, then Take 1 pill once a Offer for 3 days and stop. 9 capsule 0  . metoprolol tartrate (LOPRESSOR) 25 MG tablet Take 1 tablet (25 mg total) by mouth 2 (two) times daily. 60 tablet 3  . warfarin (COUMADIN) 5 MG tablet Take 5-7.5 mg by mouth daily. 5 mg daily except 7.5 mg on Mondays    . folic acid (FOLVITE) 1 MG tablet Take 1 tablet (1 mg total) by mouth daily. (Patient not taking: Reported on 08/30/2018) 30 tablet 0  . nicotine (NICODERM CQ - DOSED IN MG/24 HOURS) 14 mg/24hr patch Place 1 patch (14 mg total) onto the skin daily. (Patient not taking: Reported on 08/30/2018) 28 patch 0  . tetrahydrozoline (REDNESS RELIEVER EYE DROPS) 0.05 % ophthalmic solution Place 1 drop into both eyes daily as needed (red eyes).    . thiamine 100 MG tablet Take 1 tablet (100 mg total) by mouth daily. (Patient not taking: Reported on 08/30/2018) 30 tablet 0  . triamcinolone cream (KENALOG) 0.1 % Apply 1 application topically 2 (two) times daily. (Patient not taking: Reported on 08/30/2018) 454 g 0   No facility-administered medications prior to visit.     No Known  Allergies     Objective:    BP 122/82 (BP Location: Right Arm, Patient Position: Sitting, Cuff Size: Normal)   Pulse (!) 200   Temp 97.8 F (36.6 C) (Oral)   Ht 5' 9.5" (1.765 m)   Wt 187 lb 6.4 oz (85 kg)   SpO2 96%   BMI 27.28 kg/m  Wt Readings from Last 3 Encounters:  08/30/18 187 lb (84.8 kg)  08/30/18 187 lb 6.4 oz (85 kg)  07/25/18 205 lb (93 kg)    Physical Exam  Constitutional: He is oriented to person, place, and time. He appears well-developed and well-nourished. He is cooperative.  HENT:  Head: Normocephalic and atraumatic.    Mouth/Throat: Abnormal dentition. Dental caries present.  Eyes: EOM are normal.  Neck: Normal range of motion.  Cardiovascular: Regular rhythm. Tachycardia present. Exam reveals no gallop and no friction rub.  No murmur heard. Pulmonary/Chest: No tachypnea. No respiratory distress. He has no decreased breath sounds. He has no wheezes. He has no  rhonchi. He has no rales. He exhibits no tenderness.  Abdominal: Soft. Bowel sounds are normal.  Musculoskeletal: Normal range of motion. He exhibits no edema.  Neurological: He is oriented to person, place, and time. He displays tremor. Coordination normal.  Skin: Skin is warm and dry.  Psychiatric: He has a normal mood and affect. His behavior is normal. Judgment and thought content normal.  Nursing note and vitals reviewed.        Patient has been counseled extensively about nutrition and exercise as well as the importance of adherence with medications and regular follow-up. The patient was given clear instructions to go to ER or return to medical center if symptoms don't improve, worsen or new problems develop. The patient verbalized understanding.   Follow-up: No follow-ups on file.   [name], FNP-BC [facility], [place]   08/30/2018, 12:39 PM

## 2018-08-30 NOTE — ED Notes (Signed)
Pt returned from CT °

## 2018-08-30 NOTE — H&P (Signed)
Leonard Bentley FKC:[id number] DOB: Feb 23, 1961 DOA: 08/30/2018     PCP: Argentina Donovan, PA-C   Outpatient Specialists:   CARDS:   Dr. Oval Linsey    Patient arrived to ER on 08/30/18 at 1215  Patient coming from: home Lives alone,   Chief Complaint:  Chief Complaint  Patient presents with  . Alcohol Intoxication  . Oral Swelling    HPI: Leonard Bentley is a 57 y.o. male with medical history significant of MVP; NHL; HTN; depression; ETOH dependence; chronic combined heart failure; and PE on Coumadin     Presented today to his primary care provider for hospitalization follow-up.  He has not been taking his Librium or thiamine but has been compliant with Coumadin. Continues to drink last alcoholic drink was yesterday continues to drink 3 to 4 glasses of wine a January He was found to have significantly tremulous pulse was noted to be obtuse 164 EKG showed sinus tachycardia in 116's he has been having worsening shortness of breath and chest pain states he has been taking his Coumadin after being evaluated by his PCP this was of note his first visit to them.  They called ambulance for his transfer to the hospital Patient also endorses he been having tooth pain and have been spitting up blood around his tooth. Has chronic neuropathy in that has not been changed Endorsing significant jaw pain secondary to bed to from the upper jaw has been having trouble opening his mouth completely secondary to pain , Lightheaded and his usual self started to have tremors today as he has not had any alcohol On arrival to emergency department patient denied any chest pain   Stem admitted in September with shortness of breath and chest pain was found to have severe DTs requiring scheduled Librium and CIWA protocol patient refused admission to SNF and wanted to be discharged to home on Librium taper used referral to AA.  Home PT was ordered.  Reports hx of Non Hodkinds lymphoma with facial swelling  reports had  treatment but now have swelling of his face back reports chills and difficulty opening his mouth.    Regarding pertinent Chronic problems: Patient has alcoholic hepatitis History of pulmonary embolism on Coumadin has had supratherapeutic INRs in the past History of mitral regurgitation had a surgery for repair in 2017  Used to see cardiologist but lost insurance 1 year ago Had a stress test done  He was told he had irregular beat and Mitral valve disease needing repair  While in ER: Noted to have elevated LFTs INR up to 10 CIWA protocol started Given Vitamin k 5 mg oral   The following Work up has been ordered so far:  Orders Placed This Encounter  Procedures  . Comprehensive metabolic panel  . Ethanol  . cbc  . Rapid urine drug screen (hospital performed)  . Protime-INR  . Magnesium  . Phosphorus  . Diet regular Room service appropriate? Yes; Fluid consistency: Thin  . Clinical institute withdrawal assessment  . Vital signs  . Notify physician - Call MD / PA if:  . Initiate Carrier Fluid Protocol  . Clinical institute withdrawal assessment every 6 hours X 48 hours, then every 12 hours x 48 hours  . Notify MD if CIWA-AR is greater than 10 for 2 consecutive assessments.  . May administer Ativan PO versus IV if patient is tolerating PO intake well  . Vital signs every 6 hours X 48 hours, then per unit protocol  .  Cardiac monitoring  . Full code  . Consult for [facility] Admission  . ED EKG  . EKG 12-Lead  . EKG 12-Lead  . EKG 12-Lead  . EKG 12-Lead     Following Medications were ordered in ER: Medications  LORazepam (ATIVAN) injection 0-4 mg ( Intravenous See Alternative 08/30/18 1725)    Or  LORazepam (ATIVAN) tablet 0-4 mg (1 mg Oral Given 08/30/18 1725)  LORazepam (ATIVAN) injection 0-4 mg (has no administration in time range)    Or  LORazepam (ATIVAN) tablet 0-4 mg (has no administration in time range)  thiamine (VITAMIN B-1) tablet 100 mg (100 mg  Oral Given 08/30/18 1725)    Or  thiamine (B-1) injection 100 mg ( Intravenous See Alternative 08/30/18 1725)  phytonadione (VITAMIN K) tablet 5 mg (has no administration in time range)  magnesium sulfate IVPB 2 g 50 mL (has no administration in time range)    Significant initial  Findings: Abnormal Labs Reviewed  COMPREHENSIVE METABOLIC PANEL - Abnormal; Notable for the following components:      Result Value   AST 119 (*)    ALT 47 (*)    Total Bilirubin 1.4 (*)    All other components within normal limits  ETHANOL - Abnormal; Notable for the following components:   Alcohol, Ethyl (B) 10 (*)    All other components within normal limits  CBC - Abnormal; Notable for the following components:   WBC 3.5 (*)    Hemoglobin 12.2 (*)    HCT 38.2 (*)    MCV 79.6 (*)    MCH 25.4 (*)    RDW 18.5 (*)    nRBC 2.0 (*)    All other components within normal limits  PROTIME-INR - Abnormal; Notable for the following components:   Prothrombin Time >90.0 (*)    INR >10.00 (*)    All other components within normal limits  MAGNESIUM - Abnormal; Notable for the following components:   Magnesium 1.6 (*)    All other components within normal limits     Na 135 K 4.9  Cr Up from baseline see below Lab Results  Component Value Date   CREATININE 1.04 08/30/2018   CREATININE 0.84 07/30/2018   CREATININE 0.84 07/29/2018      WBC 3.5  HG/HCT stable,     Component Value Date/Time   HGB 12.2 (L) 08/30/2018 1238   HGB 12.8 (L) 09/21/2017 0825   HCT 38.2 (L) 08/30/2018 1238   HCT 40.2 09/21/2017 0825   Mag 1.6    Troponin (Point of Care Test) No results for input(s): TROPIPOC in the last 72 hours.     BNP (last 3 results) No results for input(s): BNP in the last 8760 hours.  ProBNP (last 3 results) No results for input(s): PROBNP in the last 8760 hours.  Lactic Acid, Venous No results found for: LATICACIDVEN    UA   ordered  CT  maxillofacial  CXR - Stable miliary  nodules  CT maxilo facial Inflammation in the right parapharyngeal space, masticator space, superficial facial soft tissues, and the right submandibular space  odontogenic infection no abscess  ECG:  Personally reviewed by me showing: HR : 106 Rhythm:  Sinus tachycardia, prolonged PR   no evidence of ischemic changes QTC 456     ED Triage Vitals  Enc Vitals Group     BP 08/30/18 1218 (!) 132/94     Pulse Rate 08/30/18 1218 97     Resp 08/30/18 1218 (!)  22     Temp 08/30/18 1218 97.7 F (36.5 C)     Temp Source 08/30/18 1218 Oral     SpO2 08/30/18 1218 97 %     Weight 08/30/18 1225 187 lb (84.8 kg)     Height 08/30/18 1225 5\' 9"  (1.753 m)     Head Circumference --      Peak Flow --      Pain Score 08/30/18 1225 0     Pain Loc --      Pain Edu? --      Excl. in [place]? --   TMAX(24)@       Latest  Blood pressure (!) 128/102, pulse (!) 105, temperature 97.7 F (36.5 C), temperature source Oral, resp. rate 20, height 5\' 9"  (1.753 m), weight 84.8 kg, SpO2 98 %.    Hospitalist was called for admission for Alcohol withdrawal   Review of Systems:    Pertinent positives include:  chills, fatigue,  Constitutional:  No weight loss, night sweats, Fevers, weight loss  HEENT:  No headaches, Difficulty swallowing,Tooth/dental problems, Sore throat,  No sneezing, itching, ear ache, nasal congestion, post nasal drip,  Cardio-vascular:  No chest pain, Orthopnea, PND, anasarca, dizziness, palpitations.no Bilateral lower extremity swelling  GI:  No heartburn, indigestion, abdominal pain, nausea, vomiting, diarrhea, change in bowel habits, loss of appetite, melena, blood in stool, hematemesis Resp:  no shortness of breath at rest. No dyspnea on exertion, No excess mucus, no productive cough, No non-productive cough, No coughing up of blood.No change in color of mucus.No wheezing. Skin:  no rash or lesions. No jaundice GU:  no dysuria, change in color of urine, no urgency or  frequency. No straining to urinate.  No flank pain.  Musculoskeletal:  No joint pain or no joint swelling. No decreased range of motion. No back pain.  Psych:  No change in mood or affect. No depression or anxiety. No memory loss.  Neuro: no localizing neurological complaints, no tingling, no weakness, no double vision, no gait abnormality, no slurred speech, no confusion  All systems reviewed and apart from [place] all are negative  Past Medical History:   Past Medical History:  Diagnosis Date  . Acute pulmonary embolism ([place]) 04/11/2016  . Anxiety   . Arthritis   . Atrial tachycardia ([place]) 01/06/2016  . Depression   . DTs (delirium tremens) ([place])   . Dyspnea   . ED (erectile dysfunction)   . Hypertension   . Lymphoma, non Hodgkin's 09/25/2011   Stage 2  . Mitral regurgitation 01/20/2016  . Mobitz I 10/09/2015  . Murmur 01/06/2016  . Occasional tremors   . PAC (premature atrial contraction) 10/09/2015  . S/P minimally-invasive mitral valve repair 10/27/2016   Complex valvuloplasty including artificial Gore-tex neochord placement x6 and 34 mm Edwards Physio II ring annuloplasty via right mini thoracotomy approach    Past Surgical History:  Procedure Laterality Date  . CARDIAC CATHETERIZATION N/A 08/13/2016   Procedure: Right/Left Heart Cath and Coronary Angiography;  Surgeon: [name], MD;  Location: [facility];  Service: Cardiovascular;  Laterality: N/A;  . MITRAL VALVE REPAIR Right 10/27/2016   Procedure: MINIMALLY INVASIVE MITRAL VALVE REPAIR (MVR) USING 34 PHYSIO II ANNULOPLASTY RING;  Surgeon: [name], MD;  Location: [facility];  Service: Open Heart Surgery;  Laterality: Right;  . NO PAST SURGERIES    . SKIN BIOPSY Right 04/04/2018   right mid medial anterior tibial shave  see report in chart  .  TEE WITHOUT CARDIOVERSION N/A 02/11/2016   Procedure: TRANSESOPHAGEAL ECHOCARDIOGRAM (TEE);  Surgeon: [name], MD;  Location: [facility];  Service:  Cardiovascular;  Laterality: N/A;  . TEE WITHOUT CARDIOVERSION N/A 10/27/2016   Procedure: TRANSESOPHAGEAL ECHOCARDIOGRAM (TEE);  Surgeon: [name], MD;  Location: [name];  Service: Open Heart Surgery;  Laterality: N/A;  . TRIGGER FINGER RELEASE Bilateral     Social History:  Ambulatory  independently      reports that he has been smoking cigarettes and e-cigarettes. He has a 40.00 pack-year smoking history. He quit smokeless tobacco use about 34 years ago. He reports that he drinks alcohol. He reports that he does not use drugs.   Family History:   Family History  Problem Relation Age of Onset  . Cancer Mother        BREAST(BONE)  . Cancer Father        PANCREATIC  . Hypertension Maternal Grandmother   . Stroke Maternal Aunt   . Heart attack Neg Hx     Allergies: Allergies  Allergen Reactions  . Other Other (See Comments)    "Cactus" blisters on tongue     Prior to Admission medications   Medication Sig Start Date End Date Taking? Authorizing Provider  metoprolol tartrate (LOPRESSOR) 25 MG tablet Take 1 tablet (25 mg total) by mouth 2 (two) times daily. Patient taking differently: Take 25 mg by mouth 2 (two) times a week.  06/12/18  Yes Danford, Suann Larry, MD  tetrahydrozoline (REDNESS RELIEVER EYE DROPS) 0.05 % ophthalmic solution Place 1 drop into both eyes daily as needed (red eyes).    Yes [provider]  warfarin (COUMADIN) 5 MG tablet Take 5 mg by mouth daily.    Yes [provider]  chlordiazePOXIDE (LIBRIUM) 5 MG capsule Please dispense 9 pills -  Take 1 pill two times a [place] for 3 days, then Take 1 pill once a [place] for 3 days and stop. Patient not taking: Reported on 08/30/2018 07/30/18   [name], MD  folic acid (FOLVITE) 1 MG tablet Take 1 tablet (1 mg total) by mouth daily. Patient not taking: Reported on 08/30/2018 07/31/18   [name], MD  nicotine (NICODERM CQ - DOSED IN MG/24 HOURS) 14 mg/24hr patch Place 1 patch (14  mg total) onto the skin daily. Patient not taking: Reported on 08/30/2018 07/31/18   [name], MD  thiamine 100 MG tablet Take 1 tablet (100 mg total) by mouth daily. Patient not taking: Reported on 08/30/2018 07/31/18   [name], MD  triamcinolone cream (KENALOG) 0.1 % Apply 1 application topically 2 (two) times daily. Patient not taking: Reported on 08/30/2018 02/13/18   Tysinger, Camelia Eng, PA-C   Physical Exam: Blood pressure (!) 128/102, pulse (!) 105, temperature 97.7 F (36.5 C), temperature source Oral, resp. rate 20, height 5\' 9"  (1.753 m), weight 84.8 kg, SpO2 98 %. 1. General:  in No Acute distress   Chronically ill  -appearing Right facial swelling  2. Psychological: Alert and Oriented 3. Head/ENT:   Dry Mucous Membranes, right facial swelling                          Head Non traumatic, neck supple                           Poor Dentition 4. SKIN: normal   Skin turgor,  Skin  clean Dry and intact no rash 5. Heart: Regular rate and rhythm no  Murmur, no Rub or gallop 6. Lungs:  Clear to auscultation bilaterally, no wheezes or crackles   7. Abdomen: Soft,  non-tender,    distended   obese  8. Lower extremities: no clubbing, cyanosis, or edema 9. Neurologically Grossly intact, moving all 4 extremities equally  tremoulus 10. MSK: Normal range of motion   LABS:     Recent Labs  Lab 08/30/18 1238  WBC 3.5*  HGB 12.2*  HCT 38.2*  MCV 79.6*  PLT PLATELET CLUMPS NOTED ON SMEAR, UNABLE TO ESTIMATE   Basic Metabolic Panel: Recent Labs  Lab 08/30/18 1238  NA 135  K 4.9  CL 100  CO2 23  GLUCOSE 95  BUN 8  CREATININE 1.04  CALCIUM 9.3  MG 1.6*  PHOS 3.8      Recent Labs  Lab 08/30/18 1238  AST 119*  ALT 47*  ALKPHOS 57  BILITOT 1.4*  PROT 7.3  ALBUMIN 4.3   No results for input(s): LIPASE, AMYLASE in the last 168 hours. No results for input(s): AMMONIA in the last 168 hours.    HbA1C: No results for input(s): HGBA1C in the last 72  hours. CBG: No results for input(s): GLUCAP in the last 168 hours.    Urine analysis:    Component Value Date/Time   COLORURINE AMBER (A) 07/25/2018 1929   APPEARANCEUR HAZY (A) 07/25/2018 1929   LABSPEC 1.025 07/25/2018 1929   PHURINE 5.0 07/25/2018 1929   GLUCOSEU NEGATIVE 07/25/2018 1929   HGBUR MODERATE (A) 07/25/2018 1929   BILIRUBINUR NEGATIVE 07/25/2018 1929   KETONESUR 5 (A) 07/25/2018 1929   PROTEINUR 100 (A) 07/25/2018 1929   UROBILINOGEN 1.0 01/25/2013 0825   NITRITE NEGATIVE 07/25/2018 1929   LEUKOCYTESUR NEGATIVE 07/25/2018 1929   Cultures:    Component Value Date/Time   SDES URINE, CLEAN CATCH 04/24/2018 1016   SPECREQUEST NONE 04/24/2018 1016   CULT (A) 04/24/2018 1016    <10,000 COLONIES/mL INSIGNIFICANT GROWTH Performed at [facility], [place] [address], [place], [place] [id number]    REPTSTATUS 04/25/2018 FINAL 04/24/2018 1016     Radiological Exams on Admission: No results found.  Chart has been reviewed    Assessment/Plan  57 y.o. male with medical history significant of MVP; NHL; HTN; depression; ETOH dependence; chronic combined heart failure; and PE on Coumadin   Admitted for ET [facility] withdrawal severe tachycardia possibly intermittent SVT  Present on Admission: . Alcohol withdrawal ([place]) -treat severe DTs in the past will order CIWA protocol social work consult at this point patient is interested in placement . Personal history of pulmonary embolism -currently supratherapeutic on Coumadin has had recurrent episodes of being supratherapeutic in the setting of ongoing alcohol abuse Spoke again about importance of abstaining . Neuropathy due to chemotherapeutic drug ([place]) stable continue home medications as . Elevated INR -given vitamin K in the emergency department will repeat INR in a.m. . Dehydration -rehydrate and follow fluid status . Cigarette smoker -  - Spoke about importance of quitting spent 5 minutes discussing options for treatment,  prior attempts at quitting, and dangers of smoking  -At this point patient is   interested in quitting  - order nicotine patch   - nursing tobacco cessation protocol   . Tachyarrhythmia -EKG showing sinus tachycardia but also other unclear tachyarrhythmia with narrow complex and regular suspect SVT patient has history of SVT.  Will restart home medications would benefit from  cardiology consult doubt A. fib given regular rhythm.  Patient is currently supra therapeutic on anticoagulation . Hypomagnesemia-we will replace and follow Poor dentition -patient will likely need to follow-up with dentist for tooth extractions once INR has come down. Given significant inflammation and and other infectious process will cover with oral antibiotics for now - Augmentin  Other plan as per orders.  DVT prophylaxis: super therapeutic on coumadin  Code Status:  FULL CODE   as per patient  I had personally discussed CODE STATUS with patient    Family Communication:   Family not  at  Bedside    Disposition Plan:     likely will need placement for rehabilitation                                               Would benefit from PT/OT eval prior to DC  Ordered                                      Social Work  consulted                   Nutrition    consulted                                      Consults called: email cardiology     Admission status:    inpatient     Expect 2 midnight stay secondary to severity of patient's current illness including hemodynamic instability despite optimal treatment (tachycardia  )   and extensive comorbidities including:  substance abuse     malignancy,   That are currently affecting medical management.  I expect  patient to be hospitalized for 2 midnights requiring inpatient medical care.  Patient is at high risk for adverse outcome (such as loss of life or disability) if not treated.  Indication for inpatient stay as follows:  Hemodynamic instability despite  maximal medical therapy,    Need for IV fluids,  IV medications      Level of care   SDU tele indefinitely please discontinue once patient no longer qualifies     Leonard Bentley 08/30/2018, 9:26 PM    [facility]  Pager [phone]   after 2 AM please page floor coverage PA If 7AM-7PM, please contact the [facility] team taking care of the patient  [email or web address]  Password [email or web address]

## 2018-08-30 NOTE — ED Provider Notes (Signed)
[place] [facility] EMERGENCY DEPARTMENT Provider Note   CSN: [id number] Arrival date & time: 08/30/18  1215     History   Chief Complaint Chief Complaint  Patient presents with  . Alcohol Intoxication  . Oral Swelling    HPI Leonard Bentley is a 57 y.o. male with history of hypertension, pulmonary embolism anticoagulated on Coumadin, mitral regurgitation with surgery for repair in 2017 [facility] of tachycardia and tremors.  Patient also has multiple complaints that he plan to see his PCP about today.  He reports 2 to 3-Amorin history of a painless lump on the right temple, a new pain in bilateral feet, as well as some bleeding and swelling from a right upper molar.  Patient reports his feet began hurting a couple weeks ago.  He recently had been working a job where he was on his feet all the time.  His pain is worse in the morning and gets better as he walks on it.  The pain is from his heel to his toes.  He has baseline neuropathy and this is unchanged.  Patient had a dental appointment scheduled for today, however he was unable to go because he was sent here for further evaluation.  Patient has had some pain in his jaw and trouble opening his mouth completely due to the pain.  Patient has had a mild headache.  Patient drinks a bottle of wine a night and has not changed that.  He reports he feels weak and lightheaded and having a little more tremor than usual.  He reports he always has some tremor from his anxiety, however it is worse when he has alcohol withdrawal.  He has baseline shortness of breath.  He denies any new chest pain.  He denies any recent long trips, surgeries, new leg pain or swelling.  HPI  Past Medical History:  Diagnosis Date  . Acute pulmonary embolism ([place]) 04/11/2016  . Anxiety   . Arthritis   . Atrial tachycardia ([place]) 01/06/2016  . Depression   . DTs (delirium tremens) ([place])   . Dyspnea   . ED  (erectile dysfunction)   . Hypertension   . Lymphoma, non Hodgkin's 09/25/2011   Stage 2  . Mitral regurgitation 01/20/2016  . Mobitz I 10/09/2015  . Murmur 01/06/2016  . Occasional tremors   . PAC (premature atrial contraction) 10/09/2015  . S/P minimally-invasive mitral valve repair 10/27/2016   Complex valvuloplasty including artificial Gore-tex neochord placement x6 and 34 mm [place] Physio II ring annuloplasty via right mini thoracotomy approach    Patient Active Problem List   Diagnosis Date Noted  . Tachyarrhythmia 08/30/2018  . Hypomagnesemia 08/30/2018  . Recurrent pulmonary emboli ([place])   . Hematuria   . Prolonged Q-T interval on ECG   . Delirium tremens ([place])   . LFTs abnormal   . Alcoholic hepatitis without ascites   . Alcohol withdrawal ([place]) 06/04/2018  . Dizziness   . Posterior tibial tendon dysfunction 02/21/2018  . Elevated INR 11/27/2016  . Dehydration 11/27/2016  . SVT (supraventricular tachycardia) ([place])   . S/P minimally-invasive mitral valve repair 10/27/2016  . History of pulmonary embolus (PE) 04/11/2016  . Left sided numbness   . Anxiety 04/02/2016  . Panic attacks 04/02/2016  . Mitral valve prolapse   . Mitral regurgitation 01/20/2016  . Atrial tachycardia ([place]) 01/06/2016  . Murmur 01/06/2016  . Mobitz I 10/09/2015  . PAC (premature atrial contraction) 10/09/2015  .  Arthritis 08/14/2015  . Neuropathy due to chemotherapeutic drug ([place]) 03/09/2013  . Leucopenia 01/31/2013  . Personal history of pulmonary embolism 12/28/2012  . Alcohol abuse 08/24/2012  . Cigarette smoker 08/24/2012  . Hemorrhoid 03/08/2012  . Anemia 12/10/2011  . History of non-Hodgkin's lymphoma 09/25/2011    Past Surgical History:  Procedure Laterality Date  . CARDIAC CATHETERIZATION N/A 08/13/2016   Procedure: Right/Left Heart Cath and Coronary Angiography;  Surgeon: [name], MD;  Location: [facility];  Service: Cardiovascular;  Laterality: N/A;  .  MITRAL VALVE REPAIR Right 10/27/2016   Procedure: MINIMALLY INVASIVE MITRAL VALVE REPAIR (MVR) USING 34 PHYSIO II ANNULOPLASTY RING;  Surgeon: [name], MD;  Location: [name];  Service: Open Heart Surgery;  Laterality: Right;  . NO PAST SURGERIES    . SKIN BIOPSY Right 04/04/2018   right mid medial anterior tibial shave  see report in chart  . TEE WITHOUT CARDIOVERSION N/A 02/11/2016   Procedure: TRANSESOPHAGEAL ECHOCARDIOGRAM (TEE);  Surgeon: [name], MD;  Location: [facility];  Service: Cardiovascular;  Laterality: N/A;  . TEE WITHOUT CARDIOVERSION N/A 10/27/2016   Procedure: TRANSESOPHAGEAL ECHOCARDIOGRAM (TEE);  Surgeon: [name], MD;  Location: [facility];  Service: Open Heart Surgery;  Laterality: N/A;  . TRIGGER FINGER RELEASE Bilateral         Home Medications    Prior to Admission medications   Medication Sig Start Date End Date Taking? Authorizing Provider  metoprolol tartrate (LOPRESSOR) 25 MG tablet Take 1 tablet (25 mg total) by mouth 2 (two) times daily. Patient taking differently: Take 25 mg by mouth 2 (two) times a week.  06/12/18  Yes Danford, Suann Larry, MD  tetrahydrozoline (REDNESS RELIEVER EYE DROPS) 0.05 % ophthalmic solution Place 1 drop into both eyes daily as needed (red eyes).    Yes [provider]  warfarin (COUMADIN) 5 MG tablet Take 5 mg by mouth daily.    Yes [provider]  chlordiazePOXIDE (LIBRIUM) 5 MG capsule Please dispense 9 pills -  Take 1 pill two times a [place] for 3 days, then Take 1 pill once a [place] for 3 days and stop. Patient not taking: Reported on 08/30/2018 07/30/18   [name], MD  folic acid (FOLVITE) 1 MG tablet Take 1 tablet (1 mg total) by mouth daily. Patient not taking: Reported on 08/30/2018 07/31/18   [name], MD  nicotine (NICODERM CQ - DOSED IN MG/24 HOURS) 14 mg/24hr patch Place 1 patch (14 mg total) onto the skin daily. Patient not taking: Reported on 08/30/2018 07/31/18   [name], MD  thiamine 100 MG tablet Take 1 tablet (100 mg total) by mouth daily. Patient not taking: Reported on 08/30/2018 07/31/18   [name], MD  triamcinolone cream (KENALOG) 0.1 % Apply 1 application topically 2 (two) times daily. Patient not taking: Reported on 08/30/2018 02/13/18   Tysinger, Camelia Eng, PA-C    Family History Family History  Problem Relation Age of Onset  . Cancer Mother        BREAST(BONE)  . Cancer Father        PANCREATIC  . Hypertension Maternal Grandmother   . Stroke Maternal Aunt   . Heart attack Neg Hx     Social History Social History   Tobacco Use  . Smoking status: Current Every Rimmer Smoker    Packs/Bringle: 1.00    Years: 40.00    Pack years: 40.00    Types: Cigarettes,  E-cigarettes  . Smokeless tobacco: Former Systems developer    Quit date: 1985  . Tobacco comment: vapes all [place]  Substance Use Topics  . Alcohol use: Yes    Alcohol/week: 0.0 standard drinks    Comment: 1-2 bottles of red wine a [place]  . Drug use: No     Allergies   Other   Review of Systems Review of Systems  Constitutional: Negative for chills and fever.  HENT: Positive for dental problem and facial swelling. Negative for sore throat.   Respiratory: Positive for shortness of breath (baseline, no worse).   Cardiovascular: Positive for chest pain (intermittnet since 2017, none now and not new).  Gastrointestinal: Negative for abdominal pain, nausea and vomiting.  Genitourinary: Negative for dysuria.  Musculoskeletal: Positive for arthralgias. Negative for back pain.  Skin: Negative for rash and wound.  Neurological: Positive for tremors and numbness. Negative for headaches.  Psychiatric/Behavioral: The patient is nervous/anxious.      Physical Exam Updated Vital Signs BP (!) 128/102   Pulse (!) 106   Temp 97.7 F (36.5 C) (Oral)   Resp 19   Ht 5\' 9"  (1.753 m)   Wt 84.8 kg   SpO2 100%   BMI 27.62 kg/m   Physical Exam  Constitutional: He appears well-developed  and well-nourished. No distress.  HENT:  Head: Normocephalic and atraumatic.    Mouth/Throat: Oropharynx is clear and moist. No oropharyngeal exudate.    Eyes: Pupils are equal, round, and reactive to light. Conjunctivae are normal. Right eye exhibits no discharge. Left eye exhibits no discharge. No scleral icterus.  Neck: Normal range of motion. Neck supple. No thyromegaly present.  Cardiovascular: Normal rate, regular rhythm, normal heart sounds and intact distal pulses. Exam reveals no gallop and no friction rub.  No murmur heard. Pulmonary/Chest: Effort normal and breath sounds normal. No stridor. No respiratory distress. He has no wheezes. He has no rales.  Abdominal: Soft. Bowel sounds are normal. He exhibits no distension. There is no tenderness. There is no rebound and no guarding.  Musculoskeletal: He exhibits no edema.  Lymphadenopathy:    He has no cervical adenopathy.  Neurological: He is alert. Coordination normal.  Skin: Skin is warm and dry. No rash noted. He is not diaphoretic. No pallor.  Psychiatric: He has a normal mood and affect.  Nursing note and vitals reviewed.    ED Treatments / Results  Labs (all labs ordered are listed, but only abnormal results are displayed) Labs Reviewed  COMPREHENSIVE METABOLIC PANEL - Abnormal; Notable for the following components:      Result Value   AST 119 (*)    ALT 47 (*)    Total Bilirubin 1.4 (*)    All other components within normal limits  ETHANOL - Abnormal; Notable for the following components:   Alcohol, Ethyl (B) 10 (*)    All other components within normal limits  CBC - Abnormal; Notable for the following components:   WBC 3.5 (*)    Hemoglobin 12.2 (*)    HCT 38.2 (*)    MCV 79.6 (*)    MCH 25.4 (*)    RDW 18.5 (*)    nRBC 2.0 (*)    All other components within normal limits  RAPID URINE DRUG SCREEN, HOSP PERFORMED - Abnormal; Notable for the following components:   Benzodiazepines POSITIVE (*)    All  other components within normal limits  PROTIME-INR - Abnormal; Notable for the following components:   Prothrombin Time >90.0 (*)  INR >10.00 (*)    All other components within normal limits  MAGNESIUM - Abnormal; Notable for the following components:   Magnesium 1.6 (*)    All other components within normal limits  PHOSPHORUS  TROPONIN I  TROPONIN I  TROPONIN I  TSH  URINALYSIS, ROUTINE W REFLEX MICROSCOPIC  HIV-1 RNA QUANT-NO REFLEX-BLD  PROTIME-INR    EKG EKG Interpretation  Date/Time:  Wednesday August 30 2018 16:54:35 EDT Ventricular Rate:  85 PR Interval:    QRS Duration: 82 QT Interval:  382 QTC Calculation: 455 R Axis:   -44 Text Interpretation:  Sinus rhythm Atrial premature complex Left anterior fascicular block Minimal ST elevation, anterior leads Baseline wander in lead(s) V1 V6 When compared to prior, no significant changes seen.  No STEMI Confirmed by Antony Blackbird [phone]) on 08/30/2018 5:05:03 PM   Radiology No results found.  Procedures Procedures (including critical care time)  EMERGENCY DEPARTMENT US SOFT TISSUE INTERPRETATION "Study: Limited Soft Tissue Ultrasound"  INDICATIONS: Soft tissue infection Multiple views of the body part were obtained in real-time with a multi-frequency linear probe  PERFORMED BY: Myself IMAGES ARCHIVED?: Yes SIDE:Right  BODY PART:R temple INTERPRETATION:  No abcess noted and No cellulitis noted  Medications Ordered in ED Medications  LORazepam (ATIVAN) injection 0-4 mg ( Intravenous See Alternative 08/30/18 1725)    Or  LORazepam (ATIVAN) tablet 0-4 mg (1 mg Oral Given 08/30/18 1725)  LORazepam (ATIVAN) injection 0-4 mg (has no administration in time range)    Or  LORazepam (ATIVAN) tablet 0-4 mg (has no administration in time range)  thiamine (VITAMIN B-1) tablet 100 mg (100 mg Oral Given 08/30/18 1725)    Or  thiamine (B-1) injection 100 mg ( Intravenous See Alternative 08/30/18 1725)  magnesium sulfate  IVPB 2 g 50 mL (2 g Intravenous New Bag/Given 08/30/18 1939)  phytonadione (VITAMIN K) tablet 5 mg (5 mg Oral Given 08/30/18 1926)     Initial Impression / Assessment and Plan / ED Course  I have reviewed the triage vital signs and the nursing notes.  Pertinent labs & imaging results that were available during my care of the patient were reviewed by me and considered in my medical decision making (see chart for details).     Patient presenting with multiple complaints from his PCP office.  Reportedly, he had severe tremulousness and tachycardia.  Patient reports he has been feeling weak.  He also reports an area of swelling on his right temple, bilateral feet pain, as well as dental pain with bleeding from his teeth, and ongoing neuropathy.  Patient found to have an INR greater than 10 today.  Patient's given 5 mg vitamin K.  Bedside ultrasound shows no abscess on the temple.  It is not pulsatile.  Patient also found to have runs of tachycardia up to 140s in the ED.  He quickly resolved back to normal sinus rhythm. I consulted Dr. Roel Cluck with [facility] who accepts patient for admission.  Patient case discussed with my attending, Dr. Sherry Ruffing, who guided the patient's management and agrees with plan.  Regarding patient's foot pain, suspect plantar fasciitis.  I discussed ice, massage, and orthotics.  Referral to podiatry after discharge from hospital.  Final Clinical Impressions(s) / ED Diagnoses   Final diagnoses:  Elevated INR  Alcohol withdrawal syndrome without complication [facility])  Tachycardia    ED Discharge Orders    None       [name], PA-C 08/30/18 2025    Tegeler, Gwenyth Allegra,  MD 08/30/18 2209

## 2018-08-30 NOTE — ED Triage Notes (Signed)
Pt arrived via [facility]. Pt was called r/t concern about ETOH withdrawal. Pt has mild tremor, reports last drink was last night 3-4 glasses of red wine. Pt states he only drinks at night, also tried to detox about 4 weeks ago. Pt was going to be evaluated for a lump on the right side of his face, tingling and dry skin on his feet, oral swelling with dental pain, and spitting some blood "from the bad tooth".

## 2018-08-31 ENCOUNTER — Inpatient Hospital Stay (HOSPITAL_COMMUNITY): Payer: Self-pay

## 2018-08-31 DIAGNOSIS — Z9119 Patient's noncompliance with other medical treatment and regimen: Secondary | ICD-10-CM

## 2018-08-31 DIAGNOSIS — I4891 Unspecified atrial fibrillation: Secondary | ICD-10-CM

## 2018-08-31 DIAGNOSIS — R791 Abnormal coagulation profile: Secondary | ICD-10-CM

## 2018-08-31 DIAGNOSIS — F1010 Alcohol abuse, uncomplicated: Secondary | ICD-10-CM

## 2018-08-31 DIAGNOSIS — F10230 Alcohol dependence with withdrawal, uncomplicated: Secondary | ICD-10-CM

## 2018-08-31 DIAGNOSIS — Z86711 Personal history of pulmonary embolism: Secondary | ICD-10-CM

## 2018-08-31 DIAGNOSIS — F10231 Alcohol dependence with withdrawal delirium: Principal | ICD-10-CM

## 2018-08-31 DIAGNOSIS — I471 Supraventricular tachycardia: Secondary | ICD-10-CM

## 2018-08-31 LAB — URINALYSIS, ROUTINE W REFLEX MICROSCOPIC
Bacteria, UA: NONE SEEN
Bilirubin Urine: NEGATIVE
Glucose, UA: NEGATIVE mg/dL
Ketones, ur: 5 mg/dL — AB
Leukocytes, UA: NEGATIVE
Nitrite: NEGATIVE
Protein, ur: 30 mg/dL — AB
Specific Gravity, Urine: 1.039 — ABNORMAL HIGH (ref 1.005–1.030)
pH: 6 (ref 5.0–8.0)

## 2018-08-31 LAB — CBC
HCT: 33.1 % — ABNORMAL LOW (ref 39.0–52.0)
Hemoglobin: 11.1 g/dL — ABNORMAL LOW (ref 13.0–17.0)
MCH: 26.3 pg (ref 26.0–34.0)
MCHC: 33.5 g/dL (ref 30.0–36.0)
MCV: 78.4 fL — ABNORMAL LOW (ref 80.0–100.0)
Platelets: DECREASED 10*3/uL (ref 150–400)
RBC: 4.22 MIL/uL (ref 4.22–5.81)
RDW: 17.5 % — ABNORMAL HIGH (ref 11.5–15.5)
WBC: 3.1 10*3/uL — ABNORMAL LOW (ref 4.0–10.5)
nRBC: 1.3 % — ABNORMAL HIGH (ref 0.0–0.2)

## 2018-08-31 LAB — COMPREHENSIVE METABOLIC PANEL
ALT: 48 U/L — ABNORMAL HIGH (ref 0–44)
AST: 92 U/L — ABNORMAL HIGH (ref 15–41)
Albumin: 3.8 g/dL (ref 3.5–5.0)
Alkaline Phosphatase: 52 U/L (ref 38–126)
Anion gap: 10 (ref 5–15)
BUN: 11 mg/dL (ref 6–20)
CO2: 22 mmol/L (ref 22–32)
Calcium: 8.8 mg/dL — ABNORMAL LOW (ref 8.9–10.3)
Chloride: 101 mmol/L (ref 98–111)
Creatinine, Ser: 0.94 mg/dL (ref 0.61–1.24)
GFR calc Af Amer: >60 mL/min (ref >60)
GFR calc non Af Amer: >60 mL/min (ref >60)
Glucose, Bld: 100 mg/dL — ABNORMAL HIGH (ref 70–99)
Potassium: 3.9 mmol/L (ref 3.5–5.1)
Sodium: 133 mmol/L — ABNORMAL LOW (ref 135–145)
Total Bilirubin: 1.5 mg/dL — ABNORMAL HIGH (ref 0.3–1.2)
Total Protein: 6.6 g/dL (ref 6.5–8.1)

## 2018-08-31 LAB — CBC WITH DIFFERENTIAL/PLATELET
Abs Immature Granulocytes: 0.01 10*3/uL (ref 0.00–0.07)
Basophils Absolute: 0 10*3/uL (ref 0.0–0.1)
Basophils Relative: 1 %
Eosinophils Absolute: 0.1 10*3/uL (ref 0.0–0.5)
Eosinophils Relative: 2 %
HCT: 36.3 % — ABNORMAL LOW (ref 39.0–52.0)
Hemoglobin: 11.6 g/dL — ABNORMAL LOW (ref 13.0–17.0)
Immature Granulocytes: 0 %
Lymphocytes Relative: 17 %
Lymphs Abs: 0.6 10*3/uL — ABNORMAL LOW (ref 0.7–4.0)
MCH: 25.2 pg — ABNORMAL LOW (ref 26.0–34.0)
MCHC: 32 g/dL (ref 30.0–36.0)
MCV: 78.9 fL — ABNORMAL LOW (ref 80.0–100.0)
Monocytes Absolute: 0.7 10*3/uL (ref 0.1–1.0)
Monocytes Relative: 18 %
Neutro Abs: 2.2 10*3/uL (ref 1.7–7.7)
Neutrophils Relative %: 62 %
Platelets: 97 10*3/uL — ABNORMAL LOW (ref 150–400)
RBC: 4.6 MIL/uL (ref 4.22–5.81)
RDW: 17.8 % — ABNORMAL HIGH (ref 11.5–15.5)
WBC: 3.6 10*3/uL — ABNORMAL LOW (ref 4.0–10.5)
nRBC: 1.4 % — ABNORMAL HIGH (ref 0.0–0.2)

## 2018-08-31 LAB — TROPONIN I
Troponin I: <0.03 ng/mL (ref <0.03)
Troponin I: <0.03 ng/mL (ref <0.03)

## 2018-08-31 LAB — PHOSPHORUS: Phosphorus: 3.2 mg/dL (ref 2.5–4.6)

## 2018-08-31 LAB — MRSA PCR SCREENING: MRSA by PCR: NEGATIVE

## 2018-08-31 LAB — ECHOCARDIOGRAM COMPLETE
Height: 69.5 in
Weight: 2952.4 oz

## 2018-08-31 LAB — MAGNESIUM: Magnesium: 1.8 mg/dL (ref 1.7–2.4)

## 2018-08-31 MED ORDER — LORAZEPAM 2 MG/ML IJ SOLN
2.0000 mg | INTRAMUSCULAR | Status: DC | PRN
  Administered 2018-08-31 (×2): 2 mg via INTRAVENOUS
  Filled 2018-08-31: qty 1

## 2018-08-31 MED ORDER — VITAMIN B-1 100 MG PO TABS: 100.0000 mg | ORAL_TABLET | Freq: Every day | ORAL | Status: DC

## 2018-08-31 MED ORDER — MAGNESIUM SULFATE 2 GM/50ML IV SOLN
2.0000 g | Freq: Once | INTRAVENOUS | Status: AC
  Administered 2018-08-31: 2 g via INTRAVENOUS
  Filled 2018-08-31: qty 50

## 2018-08-31 MED ORDER — FOLIC ACID 1 MG PO TABS: 1.0000 mg | ORAL_TABLET | Freq: Every day | ORAL | Status: DC

## 2018-08-31 NOTE — Care Management Note (Addendum)
Case Management Note  Patient Details  Name: Leonard Bentley MRN: [id number] Date of Birth: 1960-12-01  Subjective/Objective:   From home presents with etoh withdrawal, etoh intoxication, supra therapeutic inr of 10.17. He is established at the [facility] clinic, he has a follow up apt scheduled for 10/30 at 11 am, per MD he will be on eliquis, NCM gave him the 30 [place] savings coupon and the patient ast application to take to apt at [facility] clinic for MD to fill out.  Also if dc tomorrow , he will need to go to [facility] clinic for his medications. If dc over weekend will need a Match letter.    10/25 Leonard Bamberger RN, BSN - patient left AMA on 10/24.               Action/Plan: DC home when ready   Expected Discharge Date:                  Expected Discharge Plan:  Home/Self Care  In-House Referral:     Discharge planning Services  CM Consult, Follow-up appt scheduled, Medication Assistance, [facility]  Post Acute Care Choice:    Choice offered to:     DME Arranged:    DME Agency:     HH Arranged:    HH Agency:     Status of Service:  Completed, signed off  If discussed at [employer], dates discussed:    Additional Comments:  Leonard Mayo, RN 08/31/2018, 4:01 PM

## 2018-08-31 NOTE — Progress Notes (Signed)
   08/31/18 1050  Clinical Encounter Type  Visited With Patient  Visit Type Initial  Referral From Chaplain  Consult/Referral To Chaplain  Chaplain responded to spiritual care consult  for Pt. AD. Chaplain was welcomed into Pt. room. Chaplain confirmed Pt. interest in AD.  Chaplain completed AD education and left brochure with Pt. Pt. indicated to chaplain he wanted time to contact his daughter with the information Pt. and chaplain discussed.  The chaplain received the Pt.daughter's contact information, Natasha Simmons-[phone]. The chaplain shared the daughter's contact info with RN for updating in Pt. emergency contacts. The chaplain shared her availability to support Pt. in his decision through the end of the work [place].

## 2018-08-31 NOTE — Progress Notes (Signed)
  Echocardiogram 2D Echocardiogram has been performed.  Leonard Bentley 08/31/2018, 3:14 PM

## 2018-08-31 NOTE — Consult Note (Addendum)
Cardiology Consultation:   Patient ID: Leonard Bentley Tech MRN: [id number]; DOB: [birth year]  Admit date: 08/30/2018 Date of Consult: 08/31/2018  Primary Care Provider: Argentina Donovan, PA-C Primary Cardiologist: [name], MD  Primary Electrophysiologist:  None    Patient Profile:   Leonard Bentley is a 57 y.o. male with a hx of chronic systolic and diastolic heart failure (LVEF improved to 50-55%), hx of SVT 11/2016, s/p mitral valve repair (10/2016), prior PE on coumadin for cost, non-Hodgkin's lymphoma in remission, and alcohol and tobacco abuse  who is being seen today for the evaluation of abnormal EKG at the request of Dr. Posey Pronto.  History of Present Illness:   Leonard Bentley was last hospitalized on 06/05/18, cardiology was consulted for leg pain, weakness, tremors, and SOB. CTA negative for PE. He had lost his job of 29 years and was not coping well at that time, battling anxiety and depression previously untreated. Due to loss of insurance, he was not keeping follow up appt or following up with INR checks. EKG with first degree heart block and prolonged QTc, unchanged from prior. He was treated for alcohol withdrawal and anxiety and depression (lexapro). He refused discharge to SNF and chose to D/C home with librium taper.   He presented to PCP for follow up on 08/30/18 with chest pain and SOB. EKG with sinus tachycardia 116 bpm. EMS was dispatched to transport to ER. On arrival, INR was supratherapeutic > 10, elevated LFTs. CIWA was started and vitamin K administered.   On my interview, he has upper extremity tremors, but is alert and oriented. He denies palpitations, feelings of heart racing, chest pain, dizziness, and syncope. He reports shortness of breath when he runs - he runs 40 min most days of the week. No other complaints. He admits to missing INR checks.    Past Medical History:  Diagnosis Date  . Acute pulmonary embolism ([place]) 04/11/2016  . Anxiety   . Arthritis   . Atrial  tachycardia ([place]) 01/06/2016  . Depression   . DTs (delirium tremens) ([place])   . Dyspnea   . ED (erectile dysfunction)   . Hypertension   . Lymphoma, non Hodgkin's 09/25/2011   Stage 2  . Mitral regurgitation 01/20/2016  . Mobitz I 10/09/2015  . Murmur 01/06/2016  . Occasional tremors   . PAC (premature atrial contraction) 10/09/2015  . S/P minimally-invasive mitral valve repair 10/27/2016   Complex valvuloplasty including artificial Gore-tex neochord placement x6 and 34 mm Edwards Physio II ring annuloplasty via right mini thoracotomy approach    Past Surgical History:  Procedure Laterality Date  . CARDIAC CATHETERIZATION N/A 08/13/2016   Procedure: Right/Left Heart Cath and Coronary Angiography;  Surgeon: [name], MD;  Location: [facility];  Service: Cardiovascular;  Laterality: N/A;  . MITRAL VALVE REPAIR Right 10/27/2016   Procedure: MINIMALLY INVASIVE MITRAL VALVE REPAIR (MVR) USING 34 PHYSIO II ANNULOPLASTY RING;  Surgeon: [name], MD;  Location: [facility];  Service: Open Heart Surgery;  Laterality: Right;  . NO PAST SURGERIES    . SKIN BIOPSY Right 04/04/2018   right mid medial anterior tibial shave  see report in chart  . TEE WITHOUT CARDIOVERSION N/A 02/11/2016   Procedure: TRANSESOPHAGEAL ECHOCARDIOGRAM (TEE);  Surgeon: [name], MD;  Location: [facility];  Service: Cardiovascular;  Laterality: N/A;  . TEE WITHOUT CARDIOVERSION N/A 10/27/2016   Procedure: TRANSESOPHAGEAL ECHOCARDIOGRAM (TEE);  Surgeon: [name], MD;  Location: [facility];  Service: Open Heart  Surgery;  Laterality: N/A;  . TRIGGER FINGER RELEASE Bilateral      Home Medications:  Prior to Admission medications   Medication Sig Start Date End Date Taking? Authorizing Provider  metoprolol tartrate (LOPRESSOR) 25 MG tablet Take 1 tablet (25 mg total) by mouth 2 (two) times daily. Patient taking differently: Take 25 mg by mouth 2 (two) times a week.  06/12/18  Yes Danford, Suann Larry, MD  tetrahydrozoline (REDNESS RELIEVER EYE DROPS) 0.05 % ophthalmic solution Place 1 drop into both eyes daily as needed (red eyes).    Yes [provider]  warfarin (COUMADIN) 5 MG tablet Take 5 mg by mouth daily.    Yes [provider]  chlordiazePOXIDE (LIBRIUM) 5 MG capsule Please dispense 9 pills -  Take 1 pill two times a [place] for 3 days, then Take 1 pill once a [place] for 3 days and stop. Patient not taking: Reported on 08/30/2018 07/30/18   [name], MD  folic acid (FOLVITE) 1 MG tablet Take 1 tablet (1 mg total) by mouth daily. Patient not taking: Reported on 08/30/2018 07/31/18   [name], MD  nicotine (NICODERM CQ - DOSED IN MG/24 HOURS) 14 mg/24hr patch Place 1 patch (14 mg total) onto the skin daily. Patient not taking: Reported on 08/30/2018 07/31/18   [name], MD  thiamine 100 MG tablet Take 1 tablet (100 mg total) by mouth daily. Patient not taking: Reported on 08/30/2018 07/31/18   [name], MD  triamcinolone cream (KENALOG) 0.1 % Apply 1 application topically 2 (two) times daily. Patient not taking: Reported on 08/30/2018 02/13/18   Carlena Hurl, PA-C    Inpatient Medications: Scheduled Meds: . amoxicillin-clavulanate  1 tablet Oral Q12H  . LORazepam  0-4 mg Intravenous Q6H   Or  . LORazepam  0-4 mg Oral Q6H  . [START ON 09/02/2018] LORazepam  0-4 mg Intravenous Q12H   Or  . [START ON 09/02/2018] LORazepam  0-4 mg Oral Q12H  . metoprolol tartrate  25 mg Oral BID  . nicotine  14 mg Transdermal Daily  . thiamine  100 mg Oral Daily   Or  . thiamine  100 mg Intravenous Daily   Continuous Infusions: . sodium chloride 100 mL/hr at 08/31/18 0400   PRN Meds: acetaminophen **OR** acetaminophen, HYDROcodone-acetaminophen, ondansetron **OR** ondansetron (ZOFRAN) IV  Allergies:    Allergies  Allergen Reactions  . Other Other (See Comments)    "Cactus" blisters on tongue    Social History:   Social History    Socioeconomic History  . Marital status: Single    Spouse name: Not on file  . Number of children: Not on file  . Years of education: Not on file  . Highest education level: Not on file  Occupational History  . Occupation: unemployed  Social Needs  . Financial resource strain: Not on file  . Food insecurity:    Worry: Not on file    Inability: Not on file  . Transportation needs:    Medical: Not on file    Non-medical: Not on file  Tobacco Use  . Smoking status: Current Every [place] Smoker    Packs/[place]: 1.00    Years: 40.00    Pack years: 40.00    Types: Cigarettes, E-cigarettes  . Smokeless tobacco: Former Systems developer    Quit date: 1985  . Tobacco comment: vapes all [place]  Substance and Sexual Activity  . Alcohol use: Yes    Alcohol/week: 0.0 standard  drinks    Comment: 1-2 bottles of red wine a [place]  . Drug use: No  . Sexual activity: Never  Lifestyle  . Physical activity:    Days per week: Not on file    Minutes per session: Not on file  . Stress: Not on file  Relationships  . Social connections:    Talks on phone: Not on file    Gets together: Not on file    Attends religious service: Not on file    Active member of club or organization: Not on file    Attends meetings of clubs or organizations: Not on file    Relationship status: Not on file  . Intimate partner violence:    Fear of current or ex partner: Not on file    Emotionally abused: Not on file    Physically abused: Not on file    Forced sexual activity: Not on file  Other Topics Concern  . Not on file  Social History Narrative   Epworth sleepiness scale as of 10/09/15 a 1    Family History:    Family History  Problem Relation Age of Onset  . Cancer Mother        BREAST(BONE)  . Cancer Father        PANCREATIC  . Hypertension Maternal Grandmother   . Stroke Maternal Aunt   . Heart attack Neg Hx      ROS:  Please see the history of present illness.   All other ROS reviewed and negative.      Physical Exam/Data:   Vitals:   08/31/18 0509 08/31/18 0600 08/31/18 0740 08/31/18 1005  BP: 115/89  113/88 110/76  Pulse: (!) 102 97 98 (!) 104  Resp:      Temp:   98.1 F (36.7 C)   TempSrc:   Oral   SpO2: 99%  97%   Weight:      Height:        Intake/Output Summary (Last 24 hours) at 08/31/2018 1010 Last data filed at 08/31/2018 0600 Gross per 24 hour  Intake 824.66 ml  Output 200 ml  Net 624.66 ml   Filed Weights   08/30/18 1225 08/30/18 2234  Weight: 84.8 kg 83.7 kg   Body mass index is 26.86 kg/m.  General:  Well nourished, well developed, in no acute distress HEENT: normal Neck: no JVD Vascular: No carotid bruits Cardiac:  Irregular rhythm, regular rate, soft murmur Lungs:  clear to auscultation bilaterally, no wheezing, rhonchi or rales  Abd: soft, nontender, no hepatomegaly  Ext: no edema Musculoskeletal:  No deformities, BUE and BLE strength normal and equal Skin: warm and dry  Neuro:  CNs 2-12 intact, no focal abnormalities noted Psych:  Normal affect   EKG:  The EKG was personally reviewed and demonstrates:  Sinus tachcyardia with PVCs, PACs, repeat with 2:1 flutter vs Afib Telemetry:  Telemetry was personally reviewed and demonstrates:  Sinus 1st degree heart block, bouts of SVT yesterday in the afternoon - rate controlled  Relevant CV Studies:  Echo 02/22/17: Study Conclusions - Left ventricle: The cavity size was normal. There was mild   concentric hypertrophy. Systolic function was normal. The   estimated ejection fraction was in the range of 50% to 55%. Wall   motion was normal; there were no regional wall motion   abnormalities. Doppler parameters are consistent with abnormal   left ventricular relaxation (grade 1 diastolic dysfunction).   Doppler parameters are consistent with indeterminate ventricular   filling  pressure. - Aortic valve: Transvalvular velocity was within the normal range.   There was no stenosis. There was no  regurgitation. - Mitral valve: Prior procedures included surgical repair.   Transvalvular velocity was within the normal range. There was no   evidence for stenosis. There was no regurgitation. - Left atrium: The atrium was moderately dilated. - Right ventricle: Systolic function was normal. - Atrial septum: No defect or patent foramen ovale was identified   by color flow Doppler. - Tricuspid valve: There was mild regurgitation. - Pulmonary arteries: Systolic pressure was within the normal   range. PA peak pressure: 15 mm Hg (S).  Impressions: - Since echo 04/453, systolic function has improved and there is no   evidence of ascending aorta aneurysm.  Right and left heart cath 08/13/16:  The left ventricular systolic function is normal.  LV end diastolic pressure is normal.  The left ventricular ejection fraction is 55-65% by visual estimate.  There is moderate mitral valve prolapse.  LV end diastolic pressure is normal.  Normal right heart pressures. CO 5.7 L/min. Cardiac index 2.8. PA sat 65%.  Tortuous abdominal aorta without aneurysm.  No significant coronary artery disease. 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   Restart Xarelto tomorrow.  Aspirin also continued per his home meds.  Followup with Dr. Oval Linsey.   Laboratory Data:  Chemistry Recent Labs  Lab 08/30/18 1238 08/31/18 0710  NA 135 133*  K 4.9 3.9  CL 100 101  CO2 23 22  GLUCOSE 95 100*  BUN 8 11  CREATININE 1.04 0.94  CALCIUM 9.3 8.8*  GFRNONAA >60 >60  GFRAA >60 >60  ANIONGAP 12 10    Recent Labs  Lab 08/30/18 1238 08/31/18 0710  PROT 7.3 6.6  ALBUMIN 4.3 3.8  AST 119* 92*  ALT 47* 48*  ALKPHOS 57 52  BILITOT 1.4* 1.5*   Hematology Recent Labs  Lab 08/30/18 1238 08/31/18 0228 08/31/18 0710  WBC 3.5* 3.6* 3.1*  RBC 4.80 4.60 4.22  HGB 12.2* 11.6* 11.1*  HCT 38.2* 36.3* 33.1*  MCV 79.6* 78.9* 78.4*  MCH 25.4* 25.2* 26.3  MCHC 31.9 32.0  33.5  RDW 18.5* 17.8* 17.5*  PLT PLATELET CLUMPS NOTED ON SMEAR, UNABLE TO ESTIMATE 97* PLATELET CLUMPS NOTED ON SMEAR, COUNT APPEARS DECREASED   Cardiac Enzymes Recent Labs  Lab 08/30/18 1937 08/31/18 0228 08/31/18 0710  TROPONINI <0.03 <0.03 <0.03   No results for input(s): TROPIPOC in the last 168 hours.  BNPNo results for input(s): BNP, PROBNP in the last 168 hours.  DDimer No results for input(s): DDIMER in the last 168 hours.  Radiology/Studies:  Dg Chest 2 View  Result Date: 08/30/2018 CLINICAL DATA:  Chest pain EXAM: CHEST - 2 VIEW COMPARISON:  07/25/2018 FINDINGS: Prior valve replacement. Heart is borderline in size. Reticulonodular/miliary nodules again noted throughout the lungs, stable since prior studies. No effusions or acute bony abnormality. No acute confluent opacity. IMPRESSION: Stable miliary nodules throughout the lungs. Cardiomegaly. No acute findings. Electronically Signed   By: Rolm Baptise M.D.   On: 08/30/2018 21:17   Ct Maxillofacial W Contrast  Result Date: 08/30/2018 CLINICAL DATA:  57 y/o  M; lump on the right-sided face. EXAM: CT MAXILLOFACIAL WITH CONTRAST TECHNIQUE: Multidetector CT imaging of the maxillofacial structures was performed with intravenous contrast. Multiplanar CT image reconstructions were also generated. CONTRAST:  64mL OMNIPAQUE IOHEXOL 300 MG/ML  SOLN COMPARISON:  11/27/2016 CT head. FINDINGS: Osseous: Left  C3-4 advanced facet degenerative changes. Dental disease with multiple caries and absent crowns. No acute fracture or mandibular dislocation. Orbits: Negative. No traumatic or inflammatory finding of the orbital compartments. Sinuses: Right maxillary sinus mucosal thickening and fluid level. Normal aeration of mastoid air cells. Right frontal sinus mucous retention cyst. Soft tissues: There is inflammation in the right parapharyngeal space, masticator compartment, superficial right facial soft tissues, and the right submandibular  compartment. No discrete rim enhancing abscess. Mild asymmetric enhancement of the right parotid gland. Limited intracranial: No significant or unexpected finding. IMPRESSION: 1. Inflammation in the right parapharyngeal space, masticator space, superficial facial soft tissues, and the right submandibular space. No discrete rim enhancing abscess at this time. Findings may represent an odontogenic infection as there is extensive dental disease or possibly parotiditis given mild enhancement of the right parotid gland. 2. Right maxillary sinus sinus disease with a small fluid level. Electronically Signed   By: Kristine Garbe M.D.   On: 08/30/2018 21:09     Assessment and Plan:   1. Abnormal EKGs  - EKG with sinus tachycardia and frequent PVCs, repeat with sinus and PACs, another repeat appears to be SVT with long PR - continue lopressor 25 mg BID - anticoagulated with coumadin as below  - no medication changes, SVT will resolve as alcohol withdrawal resolves - now in sinus tachycardia - will repeat an echocardiogram - TSH WNL, Mg and K WNL  2. Supratherapeutic INR - admitted with INR > 10 - given vitamin K  - coumadin held  3. Hx of PE - was on coumadin - did not follow up with INR checks or doc appts  4. Chronic systolic and diastolic heart failure, EF since improved - BNP pending - CXR with cardiomegaly, but no signs of CHF exacerbation  For questions or updates, please contact [place] Please consult [email or web address] for contact info under   Signed, Ledora Bottcher, PA  08/31/2018 10:10 AM  The patient was seen, examined and discussed with Minette Brine , PA-C and I agree with the above.   57 y.o. male with a hx of chronic systolic and diastolic heart failure (LVEF improved to 50-55%), hx of SVT 11/2016, s/p mitral valve repair (10/2016), prior PE on coumadin for cost, non-Hodgkin's lymphoma in remission, and alcohol and tobacco abuse  who is being seen today for  the evaluation of abnormal EKG. Due to recent loss of insurance, he was not keeping follow up appt or following up with INR checks. EKG with first degree heart block and prolonged QTc, unchanged from prior. He was treated for alcohol withdrawal and anxiety and depression (lexapro). He refused discharge to SNF and chose to D/C home. He presented to PCP for follow up on 08/30/18 with chest pain and SOB. EKG with sinus tachycardia 116 bpm. EMS was dispatched to transport to ER. On arrival, INR was supratherapeutic > 10, elevated LFTs. CIWA was started and vitamin K administered.  On physical exam patient has no JVDs, his regular rate and rhythm, mild systolic murmur, lungs are clear to auscultation, he has no lower extremity edema good peripheral pulses. On review of EKGs patient has baseline sinus tachycardia and yesterday was in long PR SVT with ventricular rate 138 bpm, the patient states that he felt slightly dizzy and he felt some palpitations at the time. This has now resolved.  I would recommend to continue metoprolol 25 mg p.o. twice daily, continue hydration and treatment for DTs, replace potassium.  Hold Coumadin as  per pharmacy recommendations until INR less than 2.  Ena Dawley, MD 08/31/2018

## 2018-08-31 NOTE — Progress Notes (Signed)
Patient agitated restless pulled telemetry monitor off and refused to put it back on, CIWA 18 ativan 2mg  IV given. Educated on importance of telemetry monitoring.  Patient changed into his street clothes and packing his belongings. Alert  Oriented to person, place, time and situation.

## 2018-08-31 NOTE — Progress Notes (Addendum)
CRITICAL VALUE ALERT  Critical Value:  INR 10.7  Date & Time Notied:  08/31/18 8:31 AM   Provider Notified: Dr. Posey Pronto  08/31/18 8:31 AM   Orders Received/Actions taken: pending  Kinnie Scales, RN 08/31/18 8:31 AM

## 2018-08-31 NOTE — Progress Notes (Signed)
[facility] Progress Note  Patient: Leonard Bentley Dimon PXT:[id number]   PCP: [place] Donovan, PA-C DOB: 1961/10/21   DOA: 08/30/2018   DOS: 08/31/2018   Date of Service: the patient was seen and examined on 08/31/2018  Brief hospital course: Pt. with PMH of mitral valve repair, non-Hodgkin's lymphoma, HTN, depression, EtOH abuse, chronic combined CHF, PE on Coumadin; admitted on 08/30/2018, presented with complaint of alcohol withdrawal, was found to have alcohol withdrawals with high CIWA. Currently further plan is monitor closely.  Subjective: Feeling better.  No nausea no vomiting no fever no chills.  No chest pain no abdominal pain.  Assessment and Plan: 1.  Alcohol withdrawal. CIWA less than 10 and last 12 hours. Continue CIWA protocol. Continue to monitor on telemetry. Discontinue IV fluids. Patient mentioned that he has a plan to go to alcohol rehabilitation and he also has alcohol Anonymous close to his house where he plans to go on discharge.  2.  History of pulmonary embolism. It appears from the chart review that patient had pulmonary embolism in 2014 and 2017. Patient was initially on Xarelto, was taken off Xarelto as PE was suspected to be a provoked PE in the setting of testosterone use. After second episode of PE in 2017 patient was placed back on Xarelto and was recommended to be on lifelong anticoagulation. Back in December 2017 patient underwent mitral valve repair and following the procedure the patient was placed on Coumadin. I could not find any documentation regarding patient having any problems with him being on Xarelto and I also do not see any documentation of Xarelto failure. Suspect that the Coumadin was preferred after mitral valve repair as NOAC are not recommended for that condition. Discussed with patient whether the patient will be willing to go back on NOAC and patient is currently agreeable. Discussing with case manager and the pharmacy regarding safest  option. Given patient's concern for compliance with oral intake and medicines due to his ongoing alcohol abuse problem would recommend Eliquis.  3. Tachyarrhythmia Hypomagnesemia   EKG showing sinus tachycardia but also other unclear tachyarrhythmia with narrow complex and regular suspect SVT patient has history of SVT.  Will restart home medications would benefit from cardiology consult doubt A. fib given regular rhythm.  Patient is currently supra therapeutic on anticoagulation Appreciate cardiology consultation Replace mag  4. Poor dentition  Concern for dental abscess Pt was started on oral augmentin  Will continue for 5 days   5. Smoker Nicotine patch   6. Chronic systolic and diastolic heart failure, EF since improved - BNP pending - CXR with cardiomegaly, but no signs of CHF exacerbation  Diet: regular diet   DVT Prophylaxis: on therapeutic anticoagulation.  Advance goals of care discussion: full code  Family Communication: no family was present at bedside, at the time of interview.   Disposition:  Discharge to home tomorrow.  Consultants: cardiology  Procedures: Echocardiogram   Scheduled Meds: . amoxicillin-clavulanate  1 tablet Oral Q12H  . LORazepam  0-4 mg Intravenous Q6H   Or  . LORazepam  0-4 mg Oral Q6H  . [START ON 09/02/2018] LORazepam  0-4 mg Intravenous Q12H   Or  . [START ON 09/02/2018] LORazepam  0-4 mg Oral Q12H  . metoprolol tartrate  25 mg Oral BID  . nicotine  14 mg Transdermal Daily  . thiamine  100 mg Oral Daily   Or  . thiamine  100 mg Intravenous Daily   Continuous Infusions: . sodium chloride 100 mL/hr at  08/31/18 0900  . magnesium sulfate 1 - 4 g bolus IVPB     PRN Meds: acetaminophen **OR** acetaminophen, HYDROcodone-acetaminophen, ondansetron **OR** ondansetron (ZOFRAN) IV Antibiotics: Anti-infectives (From admission, onward)   Start     Dose/Rate Route Frequency Ordered Stop   08/30/18 2200  amoxicillin-clavulanate (AUGMENTIN)  875-125 MG per tablet 1 tablet     1 tablet Oral Every 12 hours 08/30/18 2136         Objective: Physical Exam: Vitals:   08/31/18 0600 08/31/18 0740 08/31/18 1005 08/31/18 1242  BP:  113/88 110/76   Pulse: 97 98 (!) 104   Resp:      Temp:  98.1 F (36.7 C)  (!) 97.5 F (36.4 C)  TempSrc:  Oral  Oral  SpO2:  97%    Weight:      Height:        Intake/Output Summary (Last 24 hours) at 08/31/2018 1736 Last data filed at 08/31/2018 0600 Gross per 24 hour  Intake 824.66 ml  Output 200 ml  Net 624.66 ml   Filed Weights   08/30/18 1225 08/30/18 2234  Weight: 84.8 kg 83.7 kg   General: Alert, Awake and Oriented to Time, Place and Person. Appear in mild distress, affect appropriate Eyes: PERRL, Conjunctiva normal ENT: Oral Mucosa clear moist. Neck: no JVD, no Abnormal Mass Or lumps Cardiovascular: S1 and S2 Present, no Murmur, Peripheral Pulses Present Respiratory: normal respiratory effort, Bilateral Air entry equal and Decreased, no use of accessory muscle, Clear to Auscultation, no Crackles, no wheezes Abdomen: Bowel Sound present, Soft and no tenderness, no hernia Skin: no redness, no Rash, no induration Extremities: no Pedal edema, no calf tenderness Neurologic: Grossly no focal neuro deficit. Bilaterally Equal motor strength  Data Reviewed: CBC: Recent Labs  Lab 08/30/18 1238 08/31/18 0228 08/31/18 0710  WBC 3.5* 3.6* 3.1*  NEUTROABS  --  2.2  --   HGB 12.2* 11.6* 11.1*  HCT 38.2* 36.3* 33.1*  MCV 79.6* 78.9* 78.4*  PLT PLATELET CLUMPS NOTED ON SMEAR, UNABLE TO ESTIMATE 97* PLATELET CLUMPS NOTED ON SMEAR, COUNT APPEARS DECREASED   Basic Metabolic Panel: Recent Labs  Lab 08/30/18 1238 08/31/18 0710  NA 135 133*  K 4.9 3.9  CL 100 101  CO2 23 22  GLUCOSE 95 100*  BUN 8 11  CREATININE 1.04 0.94  CALCIUM 9.3 8.8*  MG 1.6* 1.8  PHOS 3.8 3.2    Liver Function Tests: Recent Labs  Lab 08/30/18 1238 08/31/18 0710  AST 119* 92*  ALT 47* 48*  ALKPHOS  57 52  BILITOT 1.4* 1.5*  PROT 7.3 6.6  ALBUMIN 4.3 3.8   No results for input(s): LIPASE, AMYLASE in the last 168 hours. No results for input(s): AMMONIA in the last 168 hours. Coagulation Profile: Recent Labs  Lab 08/30/18 1238 08/31/18 0710  INR >10.00* >10.00*   Cardiac Enzymes: Recent Labs  Lab 08/30/18 1937 08/31/18 0228 08/31/18 0710  TROPONINI <0.03 <0.03 <0.03   BNP (last 3 results) No results for input(s): PROBNP in the last 8760 hours. CBG: No results for input(s): GLUCAP in the last 168 hours. Studies: Dg Chest 2 View  Result Date: 08/30/2018 CLINICAL DATA:  Chest pain EXAM: CHEST - 2 VIEW COMPARISON:  07/25/2018 FINDINGS: Prior valve replacement. Heart is borderline in size. Reticulonodular/miliary nodules again noted throughout the lungs, stable since prior studies. No effusions or acute bony abnormality. No acute confluent opacity. IMPRESSION: Stable miliary nodules throughout the lungs. Cardiomegaly. No acute findings. Electronically Signed  By: Rolm Baptise M.D.   On: 08/30/2018 21:17   Ct Maxillofacial W Contrast  Result Date: 08/30/2018 CLINICAL DATA:  57 y/o  M; lump on the right-sided face. EXAM: CT MAXILLOFACIAL WITH CONTRAST TECHNIQUE: Multidetector CT imaging of the maxillofacial structures was performed with intravenous contrast. Multiplanar CT image reconstructions were also generated. CONTRAST:  22mL OMNIPAQUE IOHEXOL 300 MG/ML  SOLN COMPARISON:  11/27/2016 CT head. FINDINGS: Osseous: Left C3-4 advanced facet degenerative changes. Dental disease with multiple caries and absent crowns. No acute fracture or mandibular dislocation. Orbits: Negative. No traumatic or inflammatory finding of the orbital compartments. Sinuses: Right maxillary sinus mucosal thickening and fluid level. Normal aeration of mastoid air cells. Right frontal sinus mucous retention cyst. Soft tissues: There is inflammation in the right parapharyngeal space, masticator compartment,  superficial right facial soft tissues, and the right submandibular compartment. No discrete rim enhancing abscess. Mild asymmetric enhancement of the right parotid gland. Limited intracranial: No significant or unexpected finding. IMPRESSION: 1. Inflammation in the right parapharyngeal space, masticator space, superficial facial soft tissues, and the right submandibular space. No discrete rim enhancing abscess at this time. Findings may represent an odontogenic infection as there is extensive dental disease or possibly parotiditis given mild enhancement of the right parotid gland. 2. Right maxillary sinus sinus disease with a small fluid level. Electronically Signed   By: Kristine Garbe M.D.   On: 08/30/2018 21:09     Time spent: 35 minutes  Author: Berle Mull, MD [facility] Pager: [phone] 08/31/2018 5:36 PM  Between 7PM-7AM, please contact night-coverage at [email or web address], password [facility]

## 2018-08-31 NOTE — Progress Notes (Signed)
PT Cancellation Note  Patient Details Name: Leonard Bentley MRN: [id number] DOB: Apr 03, 1961   Cancelled Treatment:     Pt noted to have INR=10, per OT pt unsteady on feet, will cont to follow.   Reinaldo Berber, PT, DPT Acute Rehabilitation Services Pager: [phone] Office: [phone]     Reinaldo Berber 08/31/2018, 1:35 PM

## 2018-08-31 NOTE — Evaluation (Signed)
Occupational Therapy Evaluation Patient Details Name: Leonard Bentley MRN: [id number] DOB: 03-24-1961 Today's Date: 08/31/2018    History of Present Illness 57 y.o. male with medical history significant of MVP; NHL; HTN; depression; ETOH dependence; chronic combined heart failure; and PE on Coumadin   Admitted for ETOH withdrawal, severe tachycardia, possibly intermittent SVT   Clinical Impression   This 57 yo male admitted with above seen today with pt just getting back to side of bed with RN A from bathroom. Noted that INR 10 in chart so was possibly going to hold eval, but with pt already up proceeded with eval in room only. Pt at an overall S level for basic ADLs except for min guard A level with ambulating (a little off balance but without LOB in room environment). Feel he will progress back to his baseline without further OT intervention needed so we will sign off.    Follow Up Recommendations  No OT follow up;Supervision - Intermittent    Equipment Recommendations  None recommended by OT       Precautions / Restrictions Precautions Precautions: Fall Restrictions Weight Bearing Restrictions: No      Mobility Bed Mobility Overal bed mobility: Independent                Transfers Overall transfer level: Needs assistance Equipment used: None Transfers: Sit to/from Stand Sit to Stand: Supervision         General transfer comment: A little unsteady with ambulation in room requiring min guard A     Balance Overall balance assessment: Needs assistance Sitting-balance support: No upper extremity supported;Feet supported Sitting balance-Leahy Scale: Good     Standing balance support: No upper extremity supported;During functional activity Standing balance-Leahy Scale: Fair                             ADL either performed or assessed with clinical judgement   ADL Overall ADL's : Needs assistance/impaired Eating/Feeding: Independent;Sitting    Grooming: Set up;Supervision/safety;Standing;Oral care   Upper Body Bathing: Supervision/ safety;Set up;Sitting;Standing   Lower Body Bathing: Supervison/ safety;Set up;Sit to/from stand   Upper Body Dressing : Set up;Supervision/safety;Standing;Sitting   Lower Body Dressing: Supervision/safety;Set up;Sit to/from stand   Toilet Transfer: Min guard A;Ambulation;Regular Toilet;Comfort height toilet   Toileting- Clothing Manipulation and Hygiene: Supervision/safety;Sit to/from stand               Vision Patient Visual Report: No change from baseline              Pertinent Vitals/Pain Pain Assessment: No/denies pain     Hand Dominance Right   Extremity/Trunk Assessment Upper Extremity Assessment Upper Extremity Assessment: (Bil hand tremors (pt reports they interfere sometimes more than others)           Communication Communication Communication: No difficulties   Cognition Arousal/Alertness: Awake/alert Behavior During Therapy: WFL for tasks assessed/performed Overall Cognitive Status: Within Functional Limits for tasks assessed                                                Home Living Family/patient expects to be discharged to:: Private residence Living Arrangements: Alone Available Help at Discharge: Family(can call brother for help if needed) Type of Home: House Home Access: Stairs to enter [employer] of Steps: 3 Entrance Stairs-Rails: Right Home  Layout: One level     Bathroom Shower/Tub: Teacher, early years/pre: Standard     Home Equipment: None          Prior Functioning/Environment Level of Independence: Independent        Comments: Sometimes takes increased time for tasks due to tremors in hands (worse sometimes over others), drives        OT Problem List: Impaired balance (sitting and/or standing)      OT Treatment/Interventions: Self-care/ADL training;Patient/family education    OT  Goals(Current goals can be found in the care plan section) Acute Rehab OT Goals Patient Stated Goal: to go home OT Goal Formulation: With patient Time For Goal Achievement: 09/14/18 Potential to Achieve Goals: Good  OT Frequency: Min 2X/week              AM-PAC PT "6 Clicks" Daily Activity     Outcome Measure Help from another person eating meals?: None Help from another person taking care of personal grooming?: A Little Help from another person toileting, which includes using toliet, bedpan, or urinal?: A Little Help from another person bathing (including washing, rinsing, drying)?: A Little Help from another person to put on and taking off regular upper body clothing?: A Little Help from another person to put on and taking off regular lower body clothing?: A Little 6 Click Score: 19   End of Session Nurse Communication: Mobility status(pt not happy I turned bed alarm on)  Activity Tolerance: Patient tolerated treatment well Patient left: with call bell/phone within reach;with bed alarm set(sitting EOB (pt not happy I turned bed alarm on))  OT Visit Diagnosis: Unsteadiness on feet (R26.81)                Time: 0037-0488 OT Time Calculation (min): 19 min Charges:  OT General Charges $OT Visit: 1 Visit OT Evaluation $OT Eval Moderate Complexity: 1 Mod  Golden Circle, OTR/L Acute [employer] Pager [phone] Office [phone]     Almon Register 08/31/2018, 1:34 PM

## 2018-08-31 NOTE — Progress Notes (Signed)
Patient left AMA. Signed AMA form. IV taken out prior to leaving.

## 2018-09-01 LAB — PROTIME-INR
INR: >10
Prothrombin Time: 82.2 seconds — ABNORMAL HIGH (ref 11.4–15.2)

## 2018-09-01 LAB — HIV-1 RNA QUANT-NO REFLEX-BLD
HIV 1 RNA Quant: <20 {copies}/mL
LOG10 HIV-1 RNA: UNDETERMINED {Log_copies}/mL

## 2018-09-02 NOTE — Progress Notes (Signed)
Pt called the [name] after D/C and requested that his medications be sent to the [facility] at [facility] because he "lost them".  CSW after verifying pt's identity called pt's provider from 10/25 and provider stated pt left AMA in the middle of the night and now being the following [place] the provider can't prescribe the pt medications after such a duration of time following the pt's D/C'ing AMA on 10/25.  CSW informed pt who voiced understanding and thanked the CSW.  CSW will continue to follow for D/C needs.  Leonard Guild. Altariq Goodall, LCSW, LCAS, CSI Clinical Social Worker Ph: [phone]

## 2018-09-02 NOTE — Discharge Summary (Addendum)
[facility] Discharge Summary   Patient: Leonard Bentley OVF:[id number]   PCP: [place] Donovan, PA-C DOB: 1961/10/20   Date of admission: 08/30/2018   Date of discharge: 09/02/2018    Discharge Diagnoses:  Principal diagnosis Alcohol withdrawal Active Problems:   History of non-Hodgkin's lymphoma   Alcohol abuse   Cigarette smoker   Personal history of pulmonary embolism   Neuropathy due to chemotherapeutic drug ([place])   Elevated INR   Dehydration   Alcohol withdrawal (HCC)   Tachyarrhythmia   Hypomagnesemia   Discharge Condition: fair  History of present illness: As per the H and P dictated on admission, "Leonard Bentley is a 57 y.o. male with medical history significant of MVP; NHL; HTN; depression; ETOH dependence; chronic combined heart failure; and PE on Coumadin     Presented today to his primary care provider for hospitalization follow-up.  He has not been taking his Librium or thiamine but has been compliant with Coumadin. Continues to drink last alcoholic drink was yesterday continues to drink 3 to 4 glasses of wine a [place] He was found to have significantly tremulous pulse was noted to be obtuse 164 EKG showed sinus tachycardia in 116's he has been having worsening shortness of breath and chest pain states he has been taking his Coumadin after being evaluated by his PCP this was of note his first visit to them.  They called ambulance for his transfer to the hospital Patient also endorses he been having tooth pain and have been spitting up blood around his tooth. Has chronic neuropathy in that has not been changed Endorsing significant jaw pain secondary to bed to from the upper jaw has been having trouble opening his mouth completely secondary to pain , Lightheaded and his usual self started to have tremors today as he has not had any alcohol On arrival to emergency department patient denied any chest pain   Stem admitted in September with shortness of breath and chest  pain was found to have severe DTs requiring scheduled Librium and CIWA protocol patient refused admission to SNF and wanted to be discharged to home on Librium taper used referral to AA.  Home PT was ordered.  Reports hx of Non Hodkinds lymphoma with facial swelling  reports had treatment but now have swelling of his face back reports chills and difficulty opening his mouth. "  Hospital Course:  Patient presented with complaints of tremors and agitation.  Found to have alcohol withdrawal.  Also had subtherapeutic INR.  Patient was treated with vitamin K.  Overnight on 08/31/2018 patient becomes agitated per documentation and left AMA.  Summary of his active problems in the hospital is as following. 1.  Alcohol withdrawal. CIWA less than 10 and last 12 hours.  Reportedly 18 before leaving AMA. Continue CIWA protocol. Continue to monitor on telemetry. Discontinue IV fluids. Patient mentioned that he has a plan to go to alcohol rehabilitation and he also has alcohol Anonymous close to his house where he plans to go on discharge.  2.  History of pulmonary embolism. It appears from the chart review that patient had pulmonary embolism in 2014 and 2017. Patient was initially on Xarelto, was taken off Xarelto as PE was suspected to be a provoked PE in the setting of testosterone use. After second episode of PE in 2017 patient was placed back on Xarelto and was recommended to be on lifelong anticoagulation. Back in December 2017 patient underwent mitral valve repair and following the procedure the patient  was placed on Coumadin. I could not find any documentation regarding patient having any problems with him being on Xarelto and I also do not see any documentation of Xarelto failure. Suspect that the Coumadin was preferred after mitral valve repair as NOAC are not recommended for that condition. Discussed with patient whether the patient will be willing to go back on NOAC and patient is currently  agreeable. Discussing with case manager and the pharmacy regarding safest option. Given patient's concern for compliance with oral intake and medicines due to his ongoing alcohol abuse problem would recommend Eliquis.  3. Tachyarrhythmia Hypomagnesemia   EKG showing sinus tachycardia but also other unclear tachyarrhythmia with narrow complex and regular suspect SVT patient has history of SVT. Will restart home medications  Appreciate cardiology consultation Replace mag  4. Poor dentition  Concern for dental abscess Pt was started on oral augmentin  Plan was to continue for 5 days   5. Smoker Nicotine patch   6. Chronic systolic and diastolic heart failure, EF since improved - BNP pending - CXR with cardiomegaly, but no signs of CHF exacerbation  patient left AMA during night hours, this summary is for documentation purposes only.  Procedures and Results:  none   Consultations:  Cardiology  The results of significant diagnostics from this hospitalization (including imaging, microbiology, ancillary and laboratory) are listed below for reference.    Significant Diagnostic Studies: Dg Chest 2 View  Result Date: 08/30/2018 CLINICAL DATA:  Chest pain EXAM: CHEST - 2 VIEW COMPARISON:  07/25/2018 FINDINGS: Prior valve replacement. Heart is borderline in size. Reticulonodular/miliary nodules again noted throughout the lungs, stable since prior studies. No effusions or acute bony abnormality. No acute confluent opacity. IMPRESSION: Stable miliary nodules throughout the lungs. Cardiomegaly. No acute findings. Electronically Signed   By: Rolm Baptise M.D.   On: 08/30/2018 21:17   Ct Maxillofacial W Contrast  Result Date: 08/30/2018 CLINICAL DATA:  57 y/o  M; lump on the right-sided face. EXAM: CT MAXILLOFACIAL WITH CONTRAST TECHNIQUE: Multidetector CT imaging of the maxillofacial structures was performed with intravenous contrast. Multiplanar CT image reconstructions were also  generated. CONTRAST:  71mL OMNIPAQUE IOHEXOL 300 MG/ML  SOLN COMPARISON:  11/27/2016 CT head. FINDINGS: Osseous: Left C3-4 advanced facet degenerative changes. Dental disease with multiple caries and absent crowns. No acute fracture or mandibular dislocation. Orbits: Negative. No traumatic or inflammatory finding of the orbital compartments. Sinuses: Right maxillary sinus mucosal thickening and fluid level. Normal aeration of mastoid air cells. Right frontal sinus mucous retention cyst. Soft tissues: There is inflammation in the right parapharyngeal space, masticator compartment, superficial right facial soft tissues, and the right submandibular compartment. No discrete rim enhancing abscess. Mild asymmetric enhancement of the right parotid gland. Limited intracranial: No significant or unexpected finding. IMPRESSION: 1. Inflammation in the right parapharyngeal space, masticator space, superficial facial soft tissues, and the right submandibular space. No discrete rim enhancing abscess at this time. Findings may represent an odontogenic infection as there is extensive dental disease or possibly parotiditis given mild enhancement of the right parotid gland. 2. Right maxillary sinus sinus disease with a small fluid level. Electronically Signed   By: Kristine Garbe M.D.   On: 08/30/2018 21:09    Microbiology: Recent Results (from the past 240 hour(s))  MRSA PCR Screening     Status: None   Collection Time: 08/30/18 10:48 PM  Result Value Ref Range Status   MRSA by PCR NEGATIVE NEGATIVE Final    Comment:  The GeneXpert MRSA Assay (FDA approved for NASAL specimens only), is one component of a comprehensive MRSA colonization surveillance program. It is not intended to diagnose MRSA infection nor to guide or monitor treatment for MRSA infections. Performed at [facility], [place] [address], [place], [place] [address]      Labs: CBC: Recent Labs  Lab 08/30/18 1238  08/31/18 0228 08/31/18 0710  WBC 3.5* 3.6* 3.1*  NEUTROABS  --  2.2  --   HGB 12.2* 11.6* 11.1*  HCT 38.2* 36.3* 33.1*  MCV 79.6* 78.9* 78.4*  PLT PLATELET CLUMPS NOTED ON SMEAR, UNABLE TO ESTIMATE 97* PLATELET CLUMPS NOTED ON SMEAR, COUNT APPEARS DECREASED   Basic Metabolic Panel: Recent Labs  Lab 08/30/18 1238 08/31/18 0710  NA 135 133*  K 4.9 3.9  CL 100 101  CO2 23 22  GLUCOSE 95 100*  BUN 8 11  CREATININE 1.04 0.94  CALCIUM 9.3 8.8*  MG 1.6* 1.8  PHOS 3.8 3.2   Liver Function Tests: Recent Labs  Lab 08/30/18 1238 08/31/18 0710  AST 119* 92*  ALT 47* 48*  ALKPHOS 57 52  BILITOT 1.4* 1.5*  PROT 7.3 6.6  ALBUMIN 4.3 3.8   No results for input(s): LIPASE, AMYLASE in the last 168 hours. No results for input(s): AMMONIA in the last 168 hours. Cardiac Enzymes: Recent Labs  Lab 08/30/18 1937 08/31/18 0228 08/31/18 0710  TROPONINI <0.03 <0.03 <0.03   BNP (last 3 results) No results for input(s): BNP in the last 8760 hours. CBG: No results for input(s): GLUCAP in the last 168 hours. Time spent: 15 minutes,  Signed:  Berle Mull  [facility] 09/02/2018, 4:35 PM

## 2018-09-06 ENCOUNTER — Inpatient Hospital Stay: Payer: Self-pay | Admitting: Critical Care Medicine

## 2018-09-27 ENCOUNTER — Telehealth: Payer: Self-pay

## 2018-09-29 ENCOUNTER — Ambulatory Visit: Payer: Self-pay | Admitting: Pharmacist

## 2018-09-29 DIAGNOSIS — Z9889 Other specified postprocedural states: Secondary | ICD-10-CM

## 2018-09-29 DIAGNOSIS — Z86711 Personal history of pulmonary embolism: Secondary | ICD-10-CM

## 2018-09-29 NOTE — Telephone Encounter (Signed)
Patient not able to come to clinic today 11/22. Next visit scheduled for 10/02/18

## 2018-09-29 NOTE — Telephone Encounter (Signed)
Called x 2; no answer and unable to leave message due to "mailbox full"

## 2018-10-03 ENCOUNTER — Inpatient Hospital Stay: Payer: Self-pay | Attending: Family Medicine

## 2018-10-03 ENCOUNTER — Inpatient Hospital Stay: Payer: Self-pay | Admitting: Internal Medicine

## 2018-10-09 ENCOUNTER — Ambulatory Visit: Payer: Self-pay | Admitting: Internal Medicine

## 2018-10-09 ENCOUNTER — Other Ambulatory Visit: Payer: Self-pay

## 2018-10-11 NOTE — Telephone Encounter (Signed)
Called left msg overdue inr and to come in even for free

## 2018-10-11 NOTE — Telephone Encounter (Signed)
No problem. Okay to see patient once at "no charge" to assure safety.

## 2018-10-30 ENCOUNTER — Telehealth: Payer: Self-pay

## 2018-10-30 NOTE — Telephone Encounter (Signed)
Schedule an appointment.

## 2018-10-30 NOTE — Telephone Encounter (Signed)
Patient called requesting an appointment to have INR check due to Hematoma. Please advise scheduling this appointment.

## 2018-10-30 NOTE — Telephone Encounter (Signed)
lvm for pt to call back to make an appt. [place]

## 2018-10-30 NOTE — Telephone Encounter (Signed)
Patient called back to say that he would like to see you. He don't have insurance but he will pay for the appt.

## 2018-11-01 ENCOUNTER — Other Ambulatory Visit: Payer: Self-pay

## 2018-11-01 ENCOUNTER — Inpatient Hospital Stay (HOSPITAL_COMMUNITY)
Admission: EM | Admit: 2018-11-01 | Discharge: 2018-11-08 | DRG: 897 | Disposition: A | Discharge status: Home / Self Care | Payer: Self-pay | Attending: Internal Medicine | Admitting: Internal Medicine

## 2018-11-01 ENCOUNTER — Emergency Department (HOSPITAL_COMMUNITY): Payer: Self-pay

## 2018-11-01 ENCOUNTER — Encounter (HOSPITAL_COMMUNITY): Payer: Self-pay | Admitting: Emergency Medicine

## 2018-11-01 DIAGNOSIS — Z8249 Family history of ischemic heart disease and other diseases of the circulatory system: Secondary | ICD-10-CM

## 2018-11-01 DIAGNOSIS — Z7901 Long term (current) use of anticoagulants: Secondary | ICD-10-CM

## 2018-11-01 DIAGNOSIS — F10239 Alcohol dependence with withdrawal, unspecified: Secondary | ICD-10-CM | POA: Diagnosis present

## 2018-11-01 DIAGNOSIS — R58 Hemorrhage, not elsewhere classified: Secondary | ICD-10-CM

## 2018-11-01 DIAGNOSIS — Z8572 Personal history of non-Hodgkin lymphomas: Secondary | ICD-10-CM

## 2018-11-01 DIAGNOSIS — E872 Acidosis: Secondary | ICD-10-CM | POA: Diagnosis present

## 2018-11-01 DIAGNOSIS — S7011XA Contusion of right thigh, initial encounter: Secondary | ICD-10-CM | POA: Diagnosis present

## 2018-11-01 DIAGNOSIS — Z9889 Other specified postprocedural states: Secondary | ICD-10-CM

## 2018-11-01 DIAGNOSIS — R000 Tachycardia, unspecified: Secondary | ICD-10-CM | POA: Diagnosis present

## 2018-11-01 DIAGNOSIS — F10231 Alcohol dependence with withdrawal delirium: Principal | ICD-10-CM | POA: Diagnosis present

## 2018-11-01 DIAGNOSIS — M25572 Pain in left ankle and joints of left foot: Secondary | ICD-10-CM

## 2018-11-01 DIAGNOSIS — N390 Urinary tract infection, site not specified: Secondary | ICD-10-CM | POA: Diagnosis present

## 2018-11-01 DIAGNOSIS — R5381 Other malaise: Secondary | ICD-10-CM | POA: Diagnosis present

## 2018-11-01 DIAGNOSIS — N179 Acute kidney failure, unspecified: Secondary | ICD-10-CM | POA: Diagnosis present

## 2018-11-01 DIAGNOSIS — F17210 Nicotine dependence, cigarettes, uncomplicated: Secondary | ICD-10-CM | POA: Diagnosis present

## 2018-11-01 DIAGNOSIS — R791 Abnormal coagulation profile: Secondary | ICD-10-CM | POA: Diagnosis present

## 2018-11-01 DIAGNOSIS — D649 Anemia, unspecified: Secondary | ICD-10-CM | POA: Diagnosis present

## 2018-11-01 DIAGNOSIS — I10 Essential (primary) hypertension: Secondary | ICD-10-CM | POA: Diagnosis present

## 2018-11-01 DIAGNOSIS — F329 Major depressive disorder, single episode, unspecified: Secondary | ICD-10-CM | POA: Diagnosis present

## 2018-11-01 DIAGNOSIS — F419 Anxiety disorder, unspecified: Secondary | ICD-10-CM | POA: Diagnosis present

## 2018-11-01 DIAGNOSIS — Z79899 Other long term (current) drug therapy: Secondary | ICD-10-CM

## 2018-11-01 DIAGNOSIS — I340 Nonrheumatic mitral (valve) insufficiency: Secondary | ICD-10-CM | POA: Diagnosis present

## 2018-11-01 DIAGNOSIS — F10939 Alcohol use, unspecified with withdrawal, unspecified: Secondary | ICD-10-CM | POA: Diagnosis present

## 2018-11-01 DIAGNOSIS — D500 Iron deficiency anemia secondary to blood loss (chronic): Secondary | ICD-10-CM | POA: Diagnosis present

## 2018-11-01 DIAGNOSIS — Z86711 Personal history of pulmonary embolism: Secondary | ICD-10-CM | POA: Diagnosis present

## 2018-11-01 LAB — BASIC METABOLIC PANEL
Anion gap: 15 (ref 5–15)
BUN: 9 mg/dL (ref 6–20)
CO2: 23 mmol/L (ref 22–32)
Calcium: 9.2 mg/dL (ref 8.9–10.3)
Chloride: 100 mmol/L (ref 98–111)
Creatinine, Ser: 0.92 mg/dL (ref 0.61–1.24)
GFR calc Af Amer: >60 mL/min (ref >60)
GFR calc non Af Amer: >60 mL/min (ref >60)
Glucose, Bld: 109 mg/dL — ABNORMAL HIGH (ref 70–99)
Potassium: 4.6 mmol/L (ref 3.5–5.1)
Sodium: 138 mmol/L (ref 135–145)

## 2018-11-01 LAB — URINALYSIS, ROUTINE W REFLEX MICROSCOPIC
Bilirubin Urine: NEGATIVE
Glucose, UA: NEGATIVE mg/dL
Ketones, ur: NEGATIVE mg/dL
Leukocytes, UA: NEGATIVE
Nitrite: NEGATIVE
Protein, ur: 100 mg/dL — AB
RBC / HPF: >50 RBC/hpf — ABNORMAL HIGH (ref 0–5)
Specific Gravity, Urine: 1.018 (ref 1.005–1.030)
pH: 7 (ref 5.0–8.0)

## 2018-11-01 LAB — CBC WITH DIFFERENTIAL/PLATELET
Basophils Absolute: 0 10*3/uL (ref 0.0–0.1)
Basophils Relative: 1 %
Eosinophils Absolute: 0 10*3/uL (ref 0.0–0.5)
Eosinophils Relative: 1 %
HCT: 33.2 % — ABNORMAL LOW (ref 39.0–52.0)
Hemoglobin: 10.7 g/dL — ABNORMAL LOW (ref 13.0–17.0)
Lymphocytes Relative: 7 %
Lymphs Abs: 0.3 10*3/uL — ABNORMAL LOW (ref 0.7–4.0)
MCH: 25.6 pg — ABNORMAL LOW (ref 26.0–34.0)
MCHC: 32.2 g/dL (ref 30.0–36.0)
MCV: 79.4 fL — ABNORMAL LOW (ref 80.0–100.0)
Monocytes Absolute: 0.5 10*3/uL (ref 0.1–1.0)
Monocytes Relative: 12 %
Neutro Abs: 3.1 10*3/uL (ref 1.7–7.7)
Neutrophils Relative %: 79 %
Platelets: 167 10*3/uL (ref 150–400)
RBC: 4.18 MIL/uL — ABNORMAL LOW (ref 4.22–5.81)
RDW: 18.1 % — ABNORMAL HIGH (ref 11.5–15.5)
WBC: 3.9 10*3/uL — ABNORMAL LOW (ref 4.0–10.5)
nRBC: 4.6 % — ABNORMAL HIGH (ref 0.0–0.2)

## 2018-11-01 LAB — ETHANOL: Alcohol, Ethyl (B): 34 mg/dL — ABNORMAL HIGH (ref <10)

## 2018-11-01 LAB — MAGNESIUM: Magnesium: 1.6 mg/dL — ABNORMAL LOW (ref 1.7–2.4)

## 2018-11-01 MED ORDER — MAGNESIUM OXIDE 400 (241.3 MG) MG PO TABS
800.0000 mg | ORAL_TABLET | Freq: Once | ORAL | Status: AC
  Administered 2018-11-01: 800 mg via ORAL
  Filled 2018-11-01: qty 2

## 2018-11-01 MED ORDER — LORAZEPAM 2 MG/ML IJ SOLN
1.0000 mg | Freq: Once | INTRAMUSCULAR | Status: AC
  Administered 2018-11-01: 1 mg via INTRAVENOUS
  Filled 2018-11-01: qty 1

## 2018-11-01 MED ORDER — LACTATED RINGERS IV BOLUS
1000.0000 mL | Freq: Once | INTRAVENOUS | Status: AC
  Administered 2018-11-01: 1000 mL via INTRAVENOUS

## 2018-11-01 MED ORDER — SODIUM CHLORIDE 0.9 % IV BOLUS
1000.0000 mL | Freq: Once | INTRAVENOUS | Status: AC
  Administered 2018-11-01: 1000 mL via INTRAVENOUS

## 2018-11-01 MED ORDER — SODIUM CHLORIDE 0.9 % IV SOLN
1.0000 g | Freq: Once | INTRAVENOUS | Status: AC
  Administered 2018-11-01: 1 g via INTRAVENOUS
  Filled 2018-11-01: qty 10

## 2018-11-01 NOTE — ED Provider Notes (Signed)
[facility] EMERGENCY DEPARTMENT Provider Note   CSN: [id number] Arrival date & time: 11/01/18  1807     History   Chief Complaint No chief complaint on file.   HPI Leonard Bentley is a 57 y.o. male.  HPI   58 year old male with numerous complaints.  He is primarily concerned about some ecchymosis he has to his right proximal/medial thigh.  First noticed about 3 days ago.  Denies any acute trauma or strain.  He is concerned he may potentially have a blood clot she has a past history of them.  Per review of records, let us most recent discharge 2 months ago they recommended Eliquis given his history of alcohol abuse and questionable compliance with regards to taking his medicine and INR checks.  Patient tells me that he is taking Coumadin though.  He reports compliance.  His INR has not been checked in quite some time though.  Complains subjective fevers.  Feels shaky.  Patient drinks alcohol daily.  Past Medical History:  Diagnosis Date  . Acute pulmonary embolism ([place]) 04/11/2016  . Anxiety   . Arthritis   . Atrial tachycardia ([place]) 01/06/2016  . Depression   . DTs (delirium tremens) ([place])   . Dyspnea   . ED (erectile dysfunction)   . Hypertension   . Lymphoma, non Hodgkin's 09/25/2011   Stage 2  . Mitral regurgitation 01/20/2016  . Mobitz I 10/09/2015  . Murmur 01/06/2016  . Occasional tremors   . PAC (premature atrial contraction) 10/09/2015  . S/P minimally-invasive mitral valve repair 10/27/2016   Complex valvuloplasty including artificial Gore-tex neochord placement x6 and 34 mm Edwards Physio II ring annuloplasty via right mini thoracotomy approach    Patient Active Problem List   Diagnosis Date Noted  . Tachyarrhythmia 08/30/2018  . Hypomagnesemia 08/30/2018  . Recurrent pulmonary emboli ([place])   . Hematuria   . Prolonged Q-T interval on ECG   . Delirium tremens ([place])   . LFTs abnormal   . Alcoholic hepatitis without ascites   . Alcohol withdrawal  ([place]) 06/04/2018  . Dizziness   . Posterior tibial tendon dysfunction 02/21/2018  . Elevated INR 11/27/2016  . Dehydration 11/27/2016  . SVT (supraventricular tachycardia) ([place])   . S/P minimally-invasive mitral valve repair 10/27/2016  . History of pulmonary embolus (PE) 04/11/2016  . Left sided numbness   . Anxiety 04/02/2016  . Panic attacks 04/02/2016  . Mitral valve prolapse   . Mitral regurgitation 01/20/2016  . Atrial tachycardia ([place]) 01/06/2016  . Murmur 01/06/2016  . Mobitz I 10/09/2015  . PAC (premature atrial contraction) 10/09/2015  . Arthritis 08/14/2015  . Neuropathy due to chemotherapeutic drug ([place]) 03/09/2013  . Leucopenia 01/31/2013  . Personal history of pulmonary embolism 12/28/2012  . Alcohol abuse 08/24/2012  . Cigarette smoker 08/24/2012  . Hemorrhoid 03/08/2012  . Anemia 12/10/2011  . History of non-Hodgkin's lymphoma 09/25/2011    Past Surgical History:  Procedure Laterality Date  . CARDIAC CATHETERIZATION N/A 08/13/2016   Procedure: Right/Left Heart Cath and Coronary Angiography;  Surgeon: [name], MD;  Location: [facility];  Service: Cardiovascular;  Laterality: N/A;  . MITRAL VALVE REPAIR Right 10/27/2016   Procedure: MINIMALLY INVASIVE MITRAL VALVE REPAIR (MVR) USING 34 PHYSIO II ANNULOPLASTY RING;  Surgeon: [name], MD;  Location: [facility];  Service: Open Heart Surgery;  Laterality: Right;  . NO PAST SURGERIES    . SKIN BIOPSY Right 04/04/2018   right mid medial anterior tibial  shave  see report in chart  . TEE WITHOUT CARDIOVERSION N/A 02/11/2016   Procedure: TRANSESOPHAGEAL ECHOCARDIOGRAM (TEE);  Surgeon: [name], MD;  Location: [facility];  Service: Cardiovascular;  Laterality: N/A;  . TEE WITHOUT CARDIOVERSION N/A 10/27/2016   Procedure: TRANSESOPHAGEAL ECHOCARDIOGRAM (TEE);  Surgeon: [name], MD;  Location: [facility];  Service: Open Heart Surgery;  Laterality: N/A;  . TRIGGER FINGER RELEASE Bilateral          Home Medications    Prior to Admission medications   Medication Sig Start Date End Date Taking? Authorizing Provider  acetaminophen (TYLENOL) 650 MG CR tablet Take 650 mg by mouth every 8 (eight) hours as needed for pain.   Yes [provider]  metoprolol tartrate (LOPRESSOR) 25 MG tablet Take 1 tablet (25 mg total) by mouth 2 (two) times daily. Patient taking differently: Take 25 mg by mouth daily as needed (weakness/dizziness).  06/12/18  Yes Danford, Suann Larry, MD  tadalafil (ADCIRCA/CIALIS) 20 MG tablet Take 20 mg by mouth daily as needed for erectile dysfunction.   Yes [provider]  tetrahydrozoline (REDNESS RELIEVER EYE DROPS) 0.05 % ophthalmic solution Place 1 drop into both eyes daily as needed (red eyes).    Yes [provider]  triamcinolone cream (KENALOG) 0.1 % Apply 1 application topically 2 (two) times daily. Patient taking differently: Apply 1 application topically every 4 (four) hours as needed (bruising).  02/13/18  Yes Tysinger, Camelia Eng, PA-C  warfarin (COUMADIN) 5 MG tablet Take 5 mg by mouth at bedtime.    Yes [provider]  chlordiazePOXIDE (LIBRIUM) 5 MG capsule Please dispense 9 pills -  Take 1 pill two times a [place] for 3 days, then Take 1 pill once a [place] for 3 days and stop. Patient not taking: Reported on 08/30/2018 07/30/18   [name], MD  folic acid (FOLVITE) 1 MG tablet Take 1 tablet (1 mg total) by mouth daily. Patient not taking: Reported on 08/30/2018 07/31/18   [name], MD  nicotine (NICODERM CQ - DOSED IN MG/24 HOURS) 14 mg/24hr patch Place 1 patch (14 mg total) onto the skin daily. Patient not taking: Reported on 08/30/2018 07/31/18   [name], MD  thiamine 100 MG tablet Take 1 tablet (100 mg total) by mouth daily. Patient not taking: Reported on 08/30/2018 07/31/18   [name], MD    Family History Family History  Problem Relation Age of Onset  . Cancer Mother         BREAST(BONE)  . Cancer Father        PANCREATIC  . Hypertension Maternal Grandmother   . Stroke Maternal Aunt   . Heart attack Neg Hx     Social History Social History   Tobacco Use  . Smoking status: Current Every [place] Smoker    Packs/[place]: 1.00    Years: 40.00    Pack years: 40.00    Types: Cigarettes, E-cigarettes  . Smokeless tobacco: Former Systems developer    Quit date: 1985  . Tobacco comment: vapes all [place]  Substance Use Topics  . Alcohol use: Yes    Alcohol/week: 0.0 standard drinks    Comment: 1-2 bottles of red wine a [place]  . Drug use: No     Allergies   Other   Review of Systems Review of Systems  All systems reviewed and negative, other than as noted in HPI.  Physical Exam Updated Vital Signs BP 97/78   Pulse (!) 101  Temp 98.3 F (36.8 C) (Oral)   Resp 18   Ht 5\' 9"  (1.753 m)   Wt 86.2 kg   SpO2 96%   BMI 28.06 kg/m   Physical Exam Vitals signs and nursing note reviewed.  Constitutional:      General: He is not in acute distress.    Appearance: He is well-developed.  HENT:     Head: Normocephalic and atraumatic.  Eyes:     General:        Right eye: No discharge.        Left eye: No discharge.     Conjunctiva/sclera: Conjunctivae normal.  Neck:     Musculoskeletal: Neck supple.  Cardiovascular:     Rate and Rhythm: Regular rhythm. Tachycardia present.     Heart sounds: Normal heart sounds. No murmur. No friction rub. No gallop.   Pulmonary:     Effort: Pulmonary effort is normal. No respiratory distress.     Breath sounds: Normal breath sounds.  Abdominal:     General: There is no distension.     Palpations: Abdomen is soft.     Tenderness: There is no abdominal tenderness.  Musculoskeletal:     Comments: Ecchymosis noted to proximal/medial right thigh.  No obviously palpable hematoma.  No appreciable swelling as compared to the left.  No calf tenderness.  Skin:    General: Skin is warm and dry.  Neurological:     Mental Status: He is  alert.     Comments: Mild tremor  Psychiatric:        Behavior: Behavior normal.        Thought Content: Thought content normal.      ED Treatments / Results  Labs (all labs ordered are listed, but only abnormal results are displayed) Labs Reviewed  CBC WITH DIFFERENTIAL/PLATELET - Abnormal; Notable for the following components:      Result Value   WBC 3.9 (*)    RBC 4.18 (*)    Hemoglobin 10.7 (*)    HCT 33.2 (*)    MCV 79.4 (*)    MCH 25.6 (*)    RDW 18.1 (*)    nRBC 4.6 (*)    Lymphs Abs 0.3 (*)    All other components within normal limits  BASIC METABOLIC PANEL - Abnormal; Notable for the following components:   Glucose, Bld 109 (*)    All other components within normal limits  MAGNESIUM - Abnormal; Notable for the following components:   Magnesium 1.6 (*)    All other components within normal limits  URINALYSIS, ROUTINE W REFLEX MICROSCOPIC - Abnormal; Notable for the following components:   Color, Urine RED (*)    APPearance TURBID (*)    Hgb urine dipstick MODERATE (*)    Protein, ur 100 (*)    RBC / HPF >50 (*)    Bacteria, UA FEW (*)    All other components within normal limits  ETHANOL - Abnormal; Notable for the following components:   Alcohol, Ethyl (B) 34 (*)    All other components within normal limits  URINE CULTURE  PROTIME-INR    EKG EKG Interpretation  Date/Time:  Wednesday November 01 2018 19:01:53 EST Ventricular Rate:  117 PR Interval:    QRS Duration: 83 QT Interval:  330 QTC Calculation: 461 R Axis:   -26 Text Interpretation:  Sinus tachycardia Atrial premature complex Borderline left axis deviation Low voltage, precordial leads Confirmed by Virgel Manifold [phone]) on 11/01/2018 7:24:41 PM   Radiology Dg  Chest 2 View  Result Date: 11/01/2018 CLINICAL DATA:  Right leg groin pain.  History of blood clots. EXAM: CHEST - 2 VIEW COMPARISON:  08/30/2018, 11/19/2016 and CT 11/27/2016 as well as CT 06/05/2018 FINDINGS: Lungs are adequately  inflated without focal airspace consolidation or effusion. Subtle hazy reticulonodular density over the mid to lower lungs without significant change as demonstrated on previous CT scans. Stable prominence of the right hilar region. Borderline stable cardiomegaly. Remainder of the exam is unchanged. IMPRESSION: No acute cardiopulmonary disease. Mild stable hazy reticulonodular density over the mid to lower lungs as demonstrated on previous CT scans thought to represent chronic inflammatory versus atypical infectious process. Electronically Signed   By: Marin Olp M.D.   On: 11/01/2018 20:01    Procedures Procedures (including critical care time)  Medications Ordered in ED Medications  sodium chloride 0.9 % bolus 1,000 mL (1,000 mLs Intravenous New Bag/Given 11/01/18 2242)  lactated ringers bolus 1,000 mL (0 mLs Intravenous Stopped 11/01/18 2030)  LORazepam (ATIVAN) injection 1 mg (1 mg Intravenous Given 11/01/18 1924)  magnesium oxide (MAG-OX) tablet 800 mg (800 mg Oral Given 11/01/18 2139)  LORazepam (ATIVAN) injection 1 mg (1 mg Intravenous Given 11/01/18 2244)  cefTRIAXone (ROCEPHIN) 1 g in sodium chloride 0.9 % 100 mL IVPB (1 g Intravenous New Bag/Given 11/01/18 2246)     Initial Impression / Assessment and Plan / ED Course  I have reviewed the triage vital signs and the nursing notes.  Pertinent labs & imaging results that were available during my care of the patient were reviewed by me and considered in my medical decision making (see chart for details).     57 year old male with numerous complaints but primarily concerned about atraumatic ecchymosis to right proximal thigh.  He is concerned for possible DVT although he says he is anticoagulated.  Unsure as to what exactly he takes.  He reports warfarin although recent discharge summary recommended Eliquis given history noncompliance with medicines and INR checks.  INR level is pending.  Clinically, I suspect he is having some  symptoms of alcohol withdrawal.  He is very tachycardic and mildly tremulous.  He is not encephalopathic though.  Blood pressure is fine.  He denies any hallucinations.  Improvement of symptoms with Ativan.  Also complains of mild dysuria and hematuria.  UA noted.  Will send urine culture.  Rocephin for possible UTI.  Unable to obtain duplex ultrasound to evaluate for possible DVT.  If he is truly compliant with his medications, this makes it less likely.  We will have him return tomorrow though for an ultrasound if discharged. Care signed out to oncoming provider with INR pending. Disposition per their discretion.  Final Clinical Impressions(s) / ED Diagnoses   Final diagnoses:  Ecchymosis    ED Discharge Orders    None       Virgel Manifold, MD 11/03/18 1810

## 2018-11-01 NOTE — ED Notes (Signed)
Patient transported to X-ray 

## 2018-11-01 NOTE — ED Triage Notes (Signed)
Pt presents to ED from home via [facility]. Pt has a history of blood clots and notices bruising when he begins to get one. Pt complains of right leg and groin pain. Pt states he cant walk because his balance is awful.   BP 176/86 HR 122 O2 96%RA

## 2018-11-01 NOTE — ED Notes (Signed)
In addition to leg/groin pain and bruising, pt c/o L arm pain, numbness in hands and feet, sob (sob for approx 3 weeks w/ exertion)

## 2018-11-02 ENCOUNTER — Observation Stay (HOSPITAL_COMMUNITY): Payer: Self-pay

## 2018-11-02 ENCOUNTER — Ambulatory Visit (HOSPITAL_BASED_OUTPATIENT_CLINIC_OR_DEPARTMENT_OTHER)
Admit: 2018-11-02 | Discharge: 2018-11-02 | Disposition: A | Discharge status: Home / Self Care | Payer: Self-pay | Attending: Emergency Medicine | Admitting: Emergency Medicine

## 2018-11-02 DIAGNOSIS — R58 Hemorrhage, not elsewhere classified: Secondary | ICD-10-CM

## 2018-11-02 DIAGNOSIS — F17210 Nicotine dependence, cigarettes, uncomplicated: Secondary | ICD-10-CM

## 2018-11-02 DIAGNOSIS — F1010 Alcohol abuse, uncomplicated: Secondary | ICD-10-CM

## 2018-11-02 DIAGNOSIS — I10 Essential (primary) hypertension: Secondary | ICD-10-CM | POA: Diagnosis present

## 2018-11-02 DIAGNOSIS — N390 Urinary tract infection, site not specified: Secondary | ICD-10-CM | POA: Diagnosis present

## 2018-11-02 DIAGNOSIS — R52 Pain, unspecified: Secondary | ICD-10-CM

## 2018-11-02 DIAGNOSIS — Z86711 Personal history of pulmonary embolism: Secondary | ICD-10-CM

## 2018-11-02 DIAGNOSIS — N3001 Acute cystitis with hematuria: Secondary | ICD-10-CM

## 2018-11-02 DIAGNOSIS — Z8572 Personal history of non-Hodgkin lymphomas: Secondary | ICD-10-CM

## 2018-11-02 LAB — CBC
HCT: 27.9 % — ABNORMAL LOW (ref 39.0–52.0)
Hemoglobin: 9.1 g/dL — ABNORMAL LOW (ref 13.0–17.0)
MCH: 25.8 pg — ABNORMAL LOW (ref 26.0–34.0)
MCHC: 32.6 g/dL (ref 30.0–36.0)
MCV: 79 fL — ABNORMAL LOW (ref 80.0–100.0)
Platelets: 141 10*3/uL — ABNORMAL LOW (ref 150–400)
RBC: 3.53 MIL/uL — ABNORMAL LOW (ref 4.22–5.81)
RDW: 17.4 % — ABNORMAL HIGH (ref 11.5–15.5)
WBC: 3.5 10*3/uL — ABNORMAL LOW (ref 4.0–10.5)
nRBC: 5.1 % — ABNORMAL HIGH (ref 0.0–0.2)

## 2018-11-02 LAB — BASIC METABOLIC PANEL
Anion gap: 9 (ref 5–15)
BUN: 8 mg/dL (ref 6–20)
CO2: 24 mmol/L (ref 22–32)
Calcium: 8.4 mg/dL — ABNORMAL LOW (ref 8.9–10.3)
Chloride: 103 mmol/L (ref 98–111)
Creatinine, Ser: 0.89 mg/dL (ref 0.61–1.24)
GFR calc Af Amer: >60 mL/min (ref >60)
GFR calc non Af Amer: >60 mL/min (ref >60)
Glucose, Bld: 96 mg/dL (ref 70–99)
Potassium: 4.7 mmol/L (ref 3.5–5.1)
Sodium: 136 mmol/L (ref 135–145)

## 2018-11-02 LAB — LACTIC ACID, PLASMA
Lactic Acid, Venous: 1.5 mmol/L (ref 0.5–1.9)
Lactic Acid, Venous: 2.2 mmol/L (ref 0.5–1.9)

## 2018-11-02 LAB — PROTIME-INR
INR: 1.4
INR: >10
INR: >10
Prothrombin Time: 17 seconds — ABNORMAL HIGH (ref 11.4–15.2)
Prothrombin Time: >90 seconds — ABNORMAL HIGH (ref 11.4–15.2)
Prothrombin Time: >90 seconds — ABNORMAL HIGH (ref 11.4–15.2)

## 2018-11-02 LAB — PROCALCITONIN: Procalcitonin: <0.1 ng/mL

## 2018-11-02 MED ORDER — ZOLPIDEM TARTRATE 5 MG PO TABS: 5.0000 mg | ORAL_TABLET | Freq: Every evening | ORAL | Status: DC | PRN

## 2018-11-02 MED ORDER — ACETAMINOPHEN 325 MG PO TABS: 650.0000 mg | ORAL_TABLET | Freq: Four times a day (QID) | ORAL | Status: DC | PRN

## 2018-11-02 MED ORDER — ACETAMINOPHEN 325 MG PO TABS
650.0000 mg | ORAL_TABLET | Freq: Four times a day (QID) | ORAL | Status: DC | PRN
  Administered 2018-11-02: 650 mg via ORAL
  Filled 2018-11-02: qty 2

## 2018-11-02 MED ORDER — ACETAMINOPHEN 650 MG RE SUPP: 650.0000 mg | Freq: Four times a day (QID) | RECTAL | Status: DC | PRN

## 2018-11-02 MED ORDER — VITAMIN K1 10 MG/ML IJ SOLN
10.0000 mg | Freq: Once | INTRAVENOUS | Status: AC
  Administered 2018-11-02: 10 mg via INTRAVENOUS
  Filled 2018-11-02: qty 1

## 2018-11-02 MED ORDER — LORAZEPAM 1 MG PO TABS
0.0000 mg | ORAL_TABLET | Freq: Four times a day (QID) | ORAL | Status: DC
  Administered 2018-11-02: 2 mg via ORAL
  Filled 2018-11-02: qty 2

## 2018-11-02 MED ORDER — ADULT MULTIVITAMIN W/MINERALS CH
1.0000 | ORAL_TABLET | Freq: Every day | ORAL | Status: DC
  Administered 2018-11-02 – 2018-11-08 (×7): 1 via ORAL
  Filled 2018-11-02 (×7): qty 1

## 2018-11-02 MED ORDER — MAGNESIUM SULFATE 2 GM/50ML IV SOLN
2.0000 g | Freq: Once | INTRAVENOUS | Status: AC
  Administered 2018-11-02: 2 g via INTRAVENOUS
  Filled 2018-11-02: qty 50

## 2018-11-02 MED ORDER — CHLORDIAZEPOXIDE HCL 5 MG PO CAPS
10.0000 mg | ORAL_CAPSULE | Freq: Once | ORAL | Status: AC
  Administered 2018-11-02: 10 mg via ORAL
  Filled 2018-11-02: qty 2

## 2018-11-02 MED ORDER — CHLORDIAZEPOXIDE HCL 5 MG PO CAPS: 5.0000 mg | ORAL_CAPSULE | Freq: Three times a day (TID) | ORAL | Status: DC

## 2018-11-02 MED ORDER — HYDRALAZINE HCL 20 MG/ML IJ SOLN: 5.0000 mg | INTRAMUSCULAR | Status: DC | PRN

## 2018-11-02 MED ORDER — PROTHROMBIN COMPLEX CONC HUMAN 500 UNITS IV KIT
2146.0000 [IU] | PACK | Freq: Once | Status: AC
  Administered 2018-11-02: 2146 [IU] via INTRAVENOUS
  Filled 2018-11-02: qty 2146

## 2018-11-02 MED ORDER — PHYTONADIONE 5 MG PO TABS
5.0000 mg | ORAL_TABLET | Freq: Once | ORAL | Status: AC
  Administered 2018-11-02: 5 mg via ORAL
  Filled 2018-11-02: qty 1

## 2018-11-02 MED ORDER — VITAMIN B-1 100 MG PO TABS
100.0000 mg | ORAL_TABLET | Freq: Every day | ORAL | Status: DC
  Administered 2018-11-02 – 2018-11-08 (×7): 100 mg via ORAL
  Filled 2018-11-02 (×7): qty 1

## 2018-11-02 MED ORDER — NICOTINE 21 MG/24HR TD PT24
21.0000 mg | MEDICATED_PATCH | Freq: Every day | TRANSDERMAL | Status: DC
  Administered 2018-11-02 – 2018-11-05 (×4): 21 mg via TRANSDERMAL
  Filled 2018-11-02 (×4): qty 1

## 2018-11-02 MED ORDER — ONDANSETRON HCL 4 MG/2ML IJ SOLN: 4.0000 mg | Freq: Four times a day (QID) | INTRAMUSCULAR | Status: DC | PRN

## 2018-11-02 MED ORDER — LORAZEPAM 2 MG/ML IJ SOLN
2.0000 mg | Freq: Four times a day (QID) | INTRAMUSCULAR | Status: DC | PRN
  Filled 2018-11-02: qty 1

## 2018-11-02 MED ORDER — LORAZEPAM 2 MG/ML IJ SOLN
0.0000 mg | Freq: Four times a day (QID) | INTRAMUSCULAR | Status: DC
  Administered 2018-11-02 (×3): 2 mg via INTRAVENOUS
  Filled 2018-11-02: qty 2
  Filled 2018-11-02 (×2): qty 1

## 2018-11-02 MED ORDER — FOLIC ACID 1 MG PO TABS
1.0000 mg | ORAL_TABLET | Freq: Every day | ORAL | Status: DC
  Administered 2018-11-02: 1 mg via ORAL
  Filled 2018-11-02: qty 1

## 2018-11-02 MED ORDER — TRIAMCINOLONE ACETONIDE 0.1 % EX CREA
1.0000 "application " | TOPICAL_CREAM | CUTANEOUS | Status: DC | PRN
  Filled 2018-11-02: qty 15

## 2018-11-02 MED ORDER — OXYCODONE HCL 5 MG PO TABS: 5.0000 mg | ORAL_TABLET | Freq: Four times a day (QID) | ORAL | Status: DC | PRN

## 2018-11-02 MED ORDER — SODIUM CHLORIDE 0.9 % IV SOLN
1.0000 g | INTRAVENOUS | Status: DC
  Administered 2018-11-02 – 2018-11-04 (×3): 1 g via INTRAVENOUS
  Filled 2018-11-02 (×3): qty 10

## 2018-11-02 MED ORDER — THIAMINE HCL 100 MG/ML IJ SOLN: 100.0000 mg | Freq: Every day | INTRAMUSCULAR | Status: DC

## 2018-11-02 MED ORDER — LORAZEPAM 1 MG PO TABS: 0.0000 mg | ORAL_TABLET | Freq: Two times a day (BID) | ORAL | Status: DC

## 2018-11-02 MED ORDER — SODIUM CHLORIDE 0.9 % IV BOLUS
1500.0000 mL | Freq: Once | INTRAVENOUS | Status: AC
  Administered 2018-11-02: 1500 mL via INTRAVENOUS

## 2018-11-02 MED ORDER — LORAZEPAM 2 MG/ML IJ SOLN: 0.0000 mg | Freq: Two times a day (BID) | INTRAMUSCULAR | Status: DC

## 2018-11-02 MED ORDER — METOPROLOL TARTRATE 25 MG PO TABS
25.0000 mg | ORAL_TABLET | Freq: Two times a day (BID) | ORAL | Status: DC
  Administered 2018-11-02 (×3): 25 mg via ORAL
  Filled 2018-11-02 (×3): qty 1

## 2018-11-02 MED ORDER — SODIUM CHLORIDE 0.9 % IV SOLN
INTRAVENOUS | Status: DC
  Administered 2018-11-02 – 2018-11-03 (×4): via INTRAVENOUS

## 2018-11-02 MED ORDER — ONDANSETRON HCL 4 MG PO TABS: 4.0000 mg | ORAL_TABLET | Freq: Four times a day (QID) | ORAL | Status: DC | PRN

## 2018-11-02 MED ORDER — NAPHAZOLINE-GLYCERIN 0.012-0.2 % OP SOLN
1.0000 [drp] | Freq: Four times a day (QID) | OPHTHALMIC | Status: DC | PRN
  Filled 2018-11-02: qty 15

## 2018-11-02 MED ORDER — CHLORDIAZEPOXIDE HCL 5 MG PO CAPS
10.0000 mg | ORAL_CAPSULE | Freq: Three times a day (TID) | ORAL | Status: DC
  Administered 2018-11-02: 10 mg via ORAL
  Filled 2018-11-02: qty 2

## 2018-11-02 MED ORDER — LORAZEPAM 2 MG/ML IJ SOLN
2.0000 mg | INTRAMUSCULAR | Status: DC | PRN
  Administered 2018-11-02 (×3): 2 mg via INTRAVENOUS
  Administered 2018-11-02 – 2018-11-03 (×2): 3 mg via INTRAVENOUS
  Administered 2018-11-03: 2 mg via INTRAVENOUS
  Administered 2018-11-03 (×5): 3 mg via INTRAVENOUS
  Filled 2018-11-02 (×2): qty 2
  Filled 2018-11-02 (×3): qty 1
  Filled 2018-11-02 (×5): qty 2
  Filled 2018-11-02: qty 1

## 2018-11-02 NOTE — ED Provider Notes (Signed)
I assumed care of this patient from Dr. Wilson Singer at [facility].  Please see their note for further details of Hx, PE.  Briefly patient is a 57 y.o. male who presented with right thigh ecchymosis noted earlier this morning.  Patient is on Coumadin due to history of prior PEs.  Hemoglobin appears to be stable.  Patient noted to have hematuria. currently pending INR  Additionally patient is daily alcoholic and currently tremulous and tachycardic appears to be in early withdrawal.  He was given Ativan.  Current plan is to follow-up INR and reassess patient  INR greater than 10.  Will give patient of vitamin K and admit for hemoglobin and INR trending, as well as management of withdrawal symptoms.        [name], MD 11/02/18 [phone]

## 2018-11-02 NOTE — Progress Notes (Signed)
Right lower leg venous study completed. See preliminary findings under CV PROC. Oda Cogan, BS, RDMS, RVT

## 2018-11-02 NOTE — Progress Notes (Signed)
CRITICAL VALUE ALERT  Critical Value:  Lactic acid 2.2  Date & Time Notied:  11/02/18 @ 05:00  Provider Notified: Baltazar Najjar, NP  Orders Received/Actions taken: No orders received

## 2018-11-02 NOTE — ED Notes (Signed)
Delay in lab draw,  Admitting MD at bedside. 

## 2018-11-02 NOTE — Progress Notes (Signed)
Patient becoming more confused and restless as Leonard Bentley progressed. Called rapid response to assess as CIWA protocol was not working. MD aware and CIWA protocol updated to stepdown CIWA, etoh withdrawal. Pt initially trying to refuse bed alarm. Stated this is why he walked home last time because we used bed alarm. Patient continues to have tremors which he says is not new. They come and go at home? Patient aware that he needs sitter for safety as he continues to try to get out of bed. Per MD patient needs to stay in bed today and will assess tomorrow for PT needs. Pt is maximum assist when trying to get out of bed. Sitter ordered and patient has Air cabin crew at present

## 2018-11-02 NOTE — Progress Notes (Signed)
Pt received from ED,  AO x4. Denied any pain, severe tremors present, unsteady gait. Pt receiving NS bolus. CHG bath completed, ccmd notified. orinted pt to call bell system and room. Call light within reach. Will continue to monitor.

## 2018-11-02 NOTE — H&P (Signed)
History and Physical    Leonard Bentley GGE:[id number] DOB: 1961-09-15 DOA: 11/01/2018  Referring MD/NP/PA:   PCP: [name], NP   Patient coming from:  The patient is coming from home.  At baseline, pt is independent for most of ADL.        Chief Complaint: pain and ecchymosis in right medial thigh  HPI: Leonard Bentley is a 57 y.o. male with medical history significant of PE on Coumadin, non-Hodgkin lymphoma, mitral valve regurgitation (s/p of mitral valve repair), tobacco and alcohol abuse, depression, anxiety, hypertension, who presents with pain and ecchymosis in the right medial thigh.  Pt states that he had hematoma in the right medial thigh one month ago and is under observation.  He states that he feels worsening pain in this area and slightly enlarged ecchymosis area.  Denies any injury.  He also reports left ankle pain after possible injury.  He states that he has left arm pain after he stretched muscle in left arm.  He states that he has exertional shortness of breath sometimes, currently no chest pain or shortness of breath at rest.  No cough, fever or chills.  He has nausea, no vomiting, diarrhea or abdominal pain.  He said he noted a little blood in urine today.  Denies dysuria or burning on urination.  No unilateral weakness.  Patient is tremulous.  He states last drink was in Christmas Eve.  ED Course: pt was found to have INR>10 WBC 3.9, hemoglobin 10.7 (11.1 on 08/31/2018), alcohol level 34, positive urinalysis (turbid appearance, no leukocytes, few bacteria, WBC 11-20), electrolytes renal function okay, temperature 99.9, soft blood pressure, tachycardia with heart rate up to 140s, tachypnea, oxygen saturation 95% on room air.  Chest x-ray showed mild stable hazy reticulonodular density over the mid to lower lungs. Pt is placed on telemetry bed of observation.  Review of Systems:   General: no fevers, chills, no body weight gain, has fatigue HEENT: no blurry vision, hearing  changes or sore throat Respiratory: has dyspnea, no coughing, wheezing CV: no chest pain, no palpitations GI: has nausea, no vomiting, abdominal pain, diarrhea, constipation GU: no dysuria, burning on urination, increased urinary frequency, has hematuria  Ext: no leg edema Neuro: no unilateral weakness, numbness, or tingling, no vision change or hearing loss.  Skin: no skin tear. Has large area of ecchymosis in the right medial thigh, small area of ecchymosis in both forearms. MSK: has left arm pain, left ankle pain and pain in right medial thigh. Heme: No easy bruising.  Travel history: No recent long distant travel.  Allergy:  Allergies  Allergen Reactions  . Other Other (See Comments)    "Cactus" blisters on tongue    Past Medical History:  Diagnosis Date  . Acute pulmonary embolism ([place]) 04/11/2016  . Anxiety   . Arthritis   . Atrial tachycardia ([place]) 01/06/2016  . Depression   . DTs (delirium tremens) ([place])   . Dyspnea   . ED (erectile dysfunction)   . Hypertension   . Lymphoma, non Hodgkin's 09/25/2011   Stage 2  . Mitral regurgitation 01/20/2016  . [place] I 10/09/2015  . Murmur 01/06/2016  . Occasional tremors   . PAC (premature atrial contraction) 10/09/2015  . S/P minimally-invasive mitral valve repair 10/27/2016   Complex valvuloplasty including artificial Gore-tex neochord placement x6 and 34 mm Edwards Physio II ring annuloplasty via right mini thoracotomy approach    Past Surgical History:  Procedure Laterality Date  . CARDIAC CATHETERIZATION  N/A 08/13/2016   Procedure: Right/Left Heart Cath and Coronary Angiography;  Surgeon: [name], MD;  Location: [facility];  Service: Cardiovascular;  Laterality: N/A;  . MITRAL VALVE REPAIR Right 10/27/2016   Procedure: MINIMALLY INVASIVE MITRAL VALVE REPAIR (MVR) USING 34 PHYSIO II ANNULOPLASTY RING;  Surgeon: [name], MD;  Location: [facility];  Service: Open Heart Surgery;  Laterality: Right;  . NO  PAST SURGERIES    . SKIN BIOPSY Right 04/04/2018   right mid medial anterior tibial shave  see report in chart  . TEE WITHOUT CARDIOVERSION N/A 02/11/2016   Procedure: TRANSESOPHAGEAL ECHOCARDIOGRAM (TEE);  Surgeon: [name], MD;  Location: [facility];  Service: Cardiovascular;  Laterality: N/A;  . TEE WITHOUT CARDIOVERSION N/A 10/27/2016   Procedure: TRANSESOPHAGEAL ECHOCARDIOGRAM (TEE);  Surgeon: [name], MD;  Location: [facility];  Service: Open Heart Surgery;  Laterality: N/A;  . TRIGGER FINGER RELEASE Bilateral     Social History:  reports that he has been smoking cigarettes and e-cigarettes. He has a 40.00 pack-year smoking history. He quit smokeless tobacco use about 35 years ago. He reports current alcohol use. He reports that he does not use drugs.  Family History:  Family History  Problem Relation Age of Onset  . Cancer Mother        BREAST(BONE)  . Cancer Father        PANCREATIC  . Hypertension Maternal Grandmother   . Stroke Maternal Aunt   . Heart attack Neg Hx      Prior to Admission medications   Medication Sig Start Date End Date Taking? Authorizing Provider  acetaminophen (TYLENOL) 650 MG CR tablet Take 650 mg by mouth every 8 (eight) hours as needed for pain.   Yes [provider]  metoprolol tartrate (LOPRESSOR) 25 MG tablet Take 1 tablet (25 mg total) by mouth 2 (two) times daily. Patient taking differently: Take 25 mg by mouth daily as needed (weakness/dizziness).  06/12/18  Yes Danford, Suann Larry, MD  tadalafil (ADCIRCA/CIALIS) 20 MG tablet Take 20 mg by mouth daily as needed for erectile dysfunction.   Yes [provider]  tetrahydrozoline (REDNESS RELIEVER EYE DROPS) 0.05 % ophthalmic solution Place 1 drop into both eyes daily as needed (red eyes).    Yes [provider]  triamcinolone cream (KENALOG) 0.1 % Apply 1 application topically 2 (two) times daily. Patient taking differently: Apply 1 application topically every  4 (four) hours as needed (bruising).  02/13/18  Yes Tysinger, Camelia Eng, PA-C  warfarin (COUMADIN) 5 MG tablet Take 5 mg by mouth at bedtime.    Yes [provider]  chlordiazePOXIDE (LIBRIUM) 5 MG capsule Please dispense 9 pills -  Take 1 pill two times a [place] for 3 days, then Take 1 pill once a [place] for 3 days and stop. Patient not taking: Reported on 08/30/2018 07/30/18   [name], MD  folic acid (FOLVITE) 1 MG tablet Take 1 tablet (1 mg total) by mouth daily. Patient not taking: Reported on 08/30/2018 07/31/18   [name], MD  nicotine (NICODERM CQ - DOSED IN MG/24 HOURS) 14 mg/24hr patch Place 1 patch (14 mg total) onto the skin daily. Patient not taking: Reported on 08/30/2018 07/31/18   [name], MD  thiamine 100 MG tablet Take 1 tablet (100 mg total) by mouth daily. Patient not taking: Reported on 08/30/2018 07/31/18   [name], MD    Physical Exam: Vitals:   11/02/18  0045 11/02/18 0115 11/02/18 0126 11/02/18 0130  BP: 118/80 112/80 112/80 105/89  Pulse: (!) 121 (!) 116 (!) 117 (!) 113  Resp: (!) 30 18  (!) 26  Temp:      TempSrc:      SpO2: 100% 98%  100%  Weight:      Height:       General: Not in acute distress.  Patient is tremulous. HEENT:       Eyes: PERRL, EOMI, no scleral icterus.       ENT: No discharge from the ears and nose, no pharynx injection, no tonsillar enlargement.        Neck: No JVD, no bruit, no mass felt. Heme: No neck lymph node enlargement. Cardiac: S1/S2, RRR, tachycardia, no murmurs, No gallops or rubs. Respiratory:  No rales, wheezing, rhonchi or rubs. GI: Soft, nondistended, nontender, no rebound pain, no organomegaly, BS present. GU: No hematuria Ext: No pitting leg edema bilaterally. 2+DP/PT pulse bilaterally. Musculoskeletal: left ankle is tender and mildly swelling. Skin:  Has large area of ecchymosis in the right medial thigh, small area of ecchymosis in both forearms. Neuro: Alert, oriented X3, cranial  nerves II-XII grossly intact, moves all extremities normally. Psych: Patient is not psychotic, no suicidal or hemocidal ideation.  Labs on Admission: I have personally reviewed following labs and imaging studies  CBC: Recent Labs  Lab 11/01/18 1917 11/02/18 0152  WBC 3.9* 3.5*  NEUTROABS 3.1  --   HGB 10.7* 9.1*  HCT 33.2* 27.9*  MCV 79.4* 79.0*  PLT 167 062*   Basic Metabolic Panel: Recent Labs  Lab 11/01/18 1917  NA 138  K 4.6  CL 100  CO2 23  GLUCOSE 109*  BUN 9  CREATININE 0.92  CALCIUM 9.2  MG 1.6*   GFR: Estimated Creatinine Clearance: 95.2 mL/min (by C-G formula based on SCr of 0.92 mg/dL). Liver Function Tests: No results for input(s): AST, ALT, ALKPHOS, BILITOT, PROT, ALBUMIN in the last 168 hours. No results for input(s): LIPASE, AMYLASE in the last 168 hours. No results for input(s): AMMONIA in the last 168 hours. Coagulation Profile: Recent Labs  Lab 11/01/18 2349  INR >10.00*   Cardiac Enzymes: No results for input(s): CKTOTAL, CKMB, CKMBINDEX, TROPONINI in the last 168 hours. BNP (last 3 results) No results for input(s): PROBNP in the last 8760 hours. HbA1C: No results for input(s): HGBA1C in the last 72 hours. CBG: No results for input(s): GLUCAP in the last 168 hours. Lipid Profile: No results for input(s): CHOL, HDL, LDLCALC, TRIG, CHOLHDL, LDLDIRECT in the last 72 hours. Thyroid Function Tests: No results for input(s): TSH, T4TOTAL, FREET4, T3FREE, THYROIDAB in the last 72 hours. Anemia Panel: No results for input(s): VITAMINB12, FOLATE, FERRITIN, TIBC, IRON, RETICCTPCT in the last 72 hours. Urine analysis:    Component Value Date/Time   COLORURINE RED (A) 11/01/2018 2149   APPEARANCEUR TURBID (A) 11/01/2018 2149   LABSPEC 1.018 11/01/2018 2149   PHURINE 7.0 11/01/2018 2149   GLUCOSEU NEGATIVE 11/01/2018 2149   HGBUR MODERATE (A) 11/01/2018 2149   BILIRUBINUR NEGATIVE 11/01/2018 2149   [place] NEGATIVE 11/01/2018 2149   PROTEINUR  100 (A) 11/01/2018 2149   UROBILINOGEN 1.0 01/25/2013 0825   NITRITE NEGATIVE 11/01/2018 2149   LEUKOCYTESUR NEGATIVE 11/01/2018 2149   Sepsis Labs: @LABRCNTIP (procalcitonin:4,lacticidven:4) )No results found for this or any previous visit (from the past 240 hour(s)).   Radiological Exams on Admission: Dg Chest 2 View  Result Date: 11/01/2018 CLINICAL DATA:  Right leg groin  pain.  History of blood clots. EXAM: CHEST - 2 VIEW COMPARISON:  08/30/2018, 11/19/2016 and CT 11/27/2016 as well as CT 06/05/2018 FINDINGS: Lungs are adequately inflated without focal airspace consolidation or effusion. Subtle hazy reticulonodular density over the mid to lower lungs without significant change as demonstrated on previous CT scans. Stable prominence of the right hilar region. Borderline stable cardiomegaly. Remainder of the exam is unchanged. IMPRESSION: No acute cardiopulmonary disease. Mild stable hazy reticulonodular density over the mid to lower lungs as demonstrated on previous CT scans thought to represent chronic inflammatory versus atypical infectious process. Electronically Signed   By: Marin Olp M.D.   On: 11/01/2018 20:01     EKG: Independently reviewed.  Sinus rhythm, tachycardia, QTC 461, LAD, low voltage.  Assessment/Plan Principal Problem:   Supratherapeutic INR Active Problems:   Anemia   Cigarette smoker   History of pulmonary embolus (PE)   S/P minimally-invasive mitral valve repair   Alcohol withdrawal (HCC)   Tachyarrhythmia   Hypomagnesemia   HTN (hypertension)   UTI (urinary tract infection)   Supratherapeutic INR: INR>10. Pt has a large area of ecchymosis in the right medial thigh, small area of ecchymosis in both forearms. He had hematoma in right medial thigh recently. He may have a small hematoma in this area now. His hemoglobin slightly dropped from 11.1-10.7.  Patient has hematuria, otherwise no active bleeding. -will place on tele bed for obs -5 mg of Vk was  ordered by EDP ( I also discussed with pharmacist, who agreed with oral vitamin K, 5 mg at this time, and recheck INR in the morning. -Daily INR -Observe left medial thigh closely to make sure patient does not have expanding hematoma. -f/u X-ray of left ankle - prn oxycodone for pain  UTI: has hematuria and positive UA -IV rocephin -f/u Bx and Ux  Anemia: hgb 11.1-->10.7 -f/u by CBC  Cigarette smoker: -Nicotine pathc  Alcohol withdrawal: pt is tremulous, tachycardic.  At risk of developing DT. -CiWA protocol -Librium 10 mg 3 times daily -IV fluid hydration  History of pulmonary embolus (PE): INR>10. -hold coumadin -check INR daily  S/P minimally-invasive mitral valve repair: -no acute issue  Tachyarrhythmia: has hx of SVT. Now HR is 100-140s. Partially due to alcohol withdrawal. Mg =1.6. K=4.6 -continue metoprolol -Replete low magnesium  Hypomagnesemia: Mg 1.6 -replete with 2 g magnesium sulfate  HTN (hypertension): -on metoprolol -prn hydralazine IV    DVT ppx: none  (INR>10) Code Status: Full code Family Communication: None at bed side.    Disposition Plan:  Anticipate discharge back to previous home environment Consults called:  none Admission status: Obs / tele   Date of Service 11/02/2018    [name] [facility] Pager [phone]  If 7PM-7AM, please contact night-coverage [email or web address] Password [email or web address] 11/02/2018, 2:28 AM

## 2018-11-02 NOTE — ED Notes (Signed)
Patient transported to X-ray 

## 2018-11-02 NOTE — Progress Notes (Addendum)
PROGRESS NOTE    Leonard Bentley  NIO:[id number] DOB: 08-13-1961 DOA: 11/01/2018 PCP: [name], NP   Brief Narrative: Patient is a 57 year old male with past medical history of pulmonary embolism on Coumadin, non-Hodgkin's lymphoma, mitral valve regurgitation status post repair, tobacco and alcohol abuse, depression, anxiety, hypertension who presented to the emergency room with complaints of pain and ecchymosis on his right medial thigh.  He was found to have supratherapeutic INR on presentation.  His INR was more than 10 .  He was given 5 mg vitamin K oral on presentation.  This morning his INR is still more than 10.  We have ordered K Centra and IV 10 mg vitamin K today.  Assessment & Plan:   Principal Problem:   Supratherapeutic INR Active Problems:   Anemia   Cigarette smoker   History of pulmonary embolus (PE)   S/P minimally-invasive mitral valve repair   Alcohol withdrawal (HCC)   Tachyarrhythmia   Hypomagnesemia   HTN (hypertension)   UTI (urinary tract infection)  Supratherapeutic INR:Has large area of ecchymosis in the right medial thigh suggestive of hematoma.  He has this for last 1 month.  As per him it is under observation.  INR this morning is still more than 10.  Ordered Kcentra and vitamin K 10 mg IV.  We will continue to monitor daily INR. Also was found to have hematuria. Patient on Coumadin at home for history of PE.  Looks like he is not compliant with his medications, and might have taken additional doses accidentally which he does not remember. He is has history of alcohol abuse.  Acute kidney injury: Expect improvement with IV fluids.  Will check BMP tomorrow  Lactic acidosis: Mild.  Continue IV fluids  History of pulmonary embolism: On Coumadin at home.  Will continue on discharge.  Chronic alcohol abuse: Drinks half a bottle of wine every [place].  Found to be tremulous and tachycardic.  Most likely withdrawing.  Continue CIWA protocol.  Continue thiamine  and folic acid.  Suspected urinary tract infection: Denies any dysuria, but patient is a poor historian .Urinalysis suggestive of  urinary tract infection.  Started on Rocephin.  Follow-up blood culture, urine culture.Wil consider discontinuing  the antibiotics soon.  Anemia: Mild.  Continue to monitor on the background of supratherapeutic INR.  Tobacco abuse: Continue nicotine patch  Mitral regurgitation: Status post minimally invasive mitral valve repair.  Sinus tachycardia: Secondary to alcohol withdrawal.  On metoprolol.  Hypomagnesemia: Supplemented  Hypertension: Compounded by alcohol withdrawal.  Continue current medications along with as needed meds .  Debility/deconditioning: Requested for PT evaluation            DVT prophylaxis:INR is supratherapeutic Code Status: Full Family Communication: None present at the bedside Disposition Plan: Home after reversal of INR, resolution of alcohol withdrawal   Consultants: None  Procedures:None  Antimicrobials: Ceftriaxone  Subjective: Patient seen and examined the bedside this morning.  Remains hemodynamically stable.  Hematoma of the left thigh has not progressed.  Looks like he is going into DTs.  Remains tremulous, tachycardic.  Objective: Vitals:   11/02/18 0230 11/02/18 0338 11/02/18 0918 11/02/18 1200  BP:  110/87 109/75   Pulse: (!) 140 (!) 101 (!) 127 (!) 109  Resp: (!) 32 20    Temp:  97.8 F (36.6 C)    TempSrc:  Oral    SpO2: 94% 100%    Weight:  86.1 kg    Height:  5' 9.5" (1.765 m)  Intake/Output Summary (Last 24 hours) at 11/02/2018 1323 Last data filed at 11/02/2018 0600 Gross per 24 hour  Intake 4617.37 ml  Output 500 ml  Net 4117.37 ml   Filed Weights   11/01/18 1809 11/02/18 0338  Weight: 86.2 kg 86.1 kg    Examination:  General exam:Not in distress,average built, anxious HEENT:PERRL,Oral mucosa moist, Ear/Nose normal on gross exam Respiratory system: Bilateral equal air  entry, normal vesicular breath sounds, no wheezes or crackles  Cardiovascular system: Sinus tachycardia, RRR. No JVD, murmurs, rubs, gallops or clicks. No pedal edema. Gastrointestinal system: Abdomen is nondistended, soft and nontender. No organomegaly or masses felt. Normal bowel sounds heard. Central nervous system: Alert and oriented. No focal neurological deficits. Extremities: No edema, no clubbing ,no cyanosis, distal peripheral pulses palpable. Ecchymosis on the left medial thigh Skin: No ulcers,no icterus ,no pallor MSK: Normal muscle bulk,tone ,power  Data Reviewed: I have personally reviewed following labs and imaging studies  CBC: Recent Labs  Lab 11/01/18 1917 11/02/18 0152  WBC 3.9* 3.5*  NEUTROABS 3.1  --   HGB 10.7* 9.1*  HCT 33.2* 27.9*  MCV 79.4* 79.0*  PLT 167 338*   Basic Metabolic Panel: Recent Labs  Lab 11/01/18 1917 11/02/18 0152  NA 138 136  K 4.6 4.7  CL 100 103  CO2 23 24  GLUCOSE 109* 96  BUN 9 8  CREATININE 0.92 0.89  CALCIUM 9.2 8.4*  MG 1.6*  --    GFR: Estimated Creatinine Clearance: 92 mL/min (by C-G formula based on SCr of 0.89 mg/dL). Liver Function Tests: No results for input(s): AST, ALT, ALKPHOS, BILITOT, PROT, ALBUMIN in the last 168 hours. No results for input(s): LIPASE, AMYLASE in the last 168 hours. No results for input(s): AMMONIA in the last 168 hours. Coagulation Profile: Recent Labs  Lab 11/01/18 2349 11/02/18 0539  INR >10.00* >10.00*   Cardiac Enzymes: No results for input(s): CKTOTAL, CKMB, CKMBINDEX, TROPONINI in the last 168 hours. BNP (last 3 results) No results for input(s): PROBNP in the last 8760 hours. HbA1C: No results for input(s): HGBA1C in the last 72 hours. CBG: No results for input(s): GLUCAP in the last 168 hours. Lipid Profile: No results for input(s): CHOL, HDL, LDLCALC, TRIG, CHOLHDL, LDLDIRECT in the last 72 hours. Thyroid Function Tests: No results for input(s): TSH, T4TOTAL, FREET4,  T3FREE, THYROIDAB in the last 72 hours. Anemia Panel: No results for input(s): VITAMINB12, FOLATE, FERRITIN, TIBC, IRON, RETICCTPCT in the last 72 hours. Sepsis Labs: Recent Labs  Lab 11/02/18 0152 11/02/18 0424  PROCALCITON <0.10  --   LATICACIDVEN 1.5 2.2*    No results found for this or any previous visit (from the past 240 hour(s)).       Radiology Studies: Dg Chest 2 View  Result Date: 11/01/2018 CLINICAL DATA:  Right leg groin pain.  History of blood clots. EXAM: CHEST - 2 VIEW COMPARISON:  08/30/2018, 11/19/2016 and CT 11/27/2016 as well as CT 06/05/2018 FINDINGS: Lungs are adequately inflated without focal airspace consolidation or effusion. Subtle hazy reticulonodular density over the mid to lower lungs without significant change as demonstrated on previous CT scans. Stable prominence of the right hilar region. Borderline stable cardiomegaly. Remainder of the exam is unchanged. IMPRESSION: No acute cardiopulmonary disease. Mild stable hazy reticulonodular density over the mid to lower lungs as demonstrated on previous CT scans thought to represent chronic inflammatory versus atypical infectious process. Electronically Signed   By: Marin Olp M.D.   On: 11/01/2018 20:01  Dg Ankle 2 Views Left  Result Date: 11/02/2018 CLINICAL DATA:  Subacute onset of left medial ankle pain, swelling and bruising. EXAM: LEFT ANKLE - 2 VIEW COMPARISON:  Left ankle radiographs performed 02/10/2018 FINDINGS: A tiny osseous fragment dorsal to the anterior talus is new from April and may reflect an avulsion fracture. The ankle mortise is intact; the interosseous space is within normal limits. No talar tilt or subluxation is seen. The joint spaces are preserved. Soft tissue swelling is noted dorsal to the anterior talus. IMPRESSION: Tiny osseous fragment dorsal to the anterior talus is new from April and may reflect an avulsion fracture. Electronically Signed   By: Garald Balding M.D.   On: 11/02/2018  03:10   Vas [place] Lower Extremity Venous (dvt)  Result Date: 11/02/2018  Lower Venous Study Indications: Swelling, and Pain.  Risk Factors: History of PE and non-Hodgkin lymphoma. Anticoagulation: Coumadin. Performing Technologist: Oda Cogan RDMS, RVT  Examination Guidelines: A complete evaluation includes B-mode imaging, spectral Doppler, color Doppler, and power Doppler as needed of all accessible portions of each vessel. Bilateral testing is considered an integral part of a complete examination. Limited examinations for reoccurring indications may be performed as noted.  Right Venous Findings: +---------+---------------+---------+-----------+----------+-------+          CompressibilityPhasicitySpontaneityPropertiesSummary +---------+---------------+---------+-----------+----------+-------+ CFV      Full           Yes      Yes                          +---------+---------------+---------+-----------+----------+-------+ SFJ      Full                                                 +---------+---------------+---------+-----------+----------+-------+ FV Prox  Full                                                 +---------+---------------+---------+-----------+----------+-------+ FV Mid   Full                                                 +---------+---------------+---------+-----------+----------+-------+ FV DistalFull                                                 +---------+---------------+---------+-----------+----------+-------+ PFV      Full                                                 +---------+---------------+---------+-----------+----------+-------+ POP      Full           Yes      Yes                          +---------+---------------+---------+-----------+----------+-------+ PTV      Full                                                 +---------+---------------+---------+-----------+----------+-------+  PERO     Full                                                  +---------+---------------+---------+-----------+----------+-------+ GSV      Full                                                 +---------+---------------+---------+-----------+----------+-------+    Summary: Right: There is no evidence of deep vein thrombosis in the lower extremity. No cystic structure found in the popliteal fossa.  *See table(s) above for measurements and observations.    Preliminary         Scheduled Meds: . chlordiazePOXIDE  10 mg Oral TID  . folic acid  1 mg Oral Daily  . LORazepam  0-4 mg Intravenous Q6H   Or  . LORazepam  0-4 mg Oral Q6H  . [START ON 11/04/2018] LORazepam  0-4 mg Intravenous Q12H   Or  . [START ON 11/04/2018] LORazepam  0-4 mg Oral Q12H  . metoprolol tartrate  25 mg Oral BID  . multivitamin with minerals  1 tablet Oral Daily  . nicotine  21 mg Transdermal Daily  . thiamine  100 mg Oral Daily   Or  . thiamine  100 mg Intravenous Daily   Continuous Infusions: . sodium chloride 125 mL/hr at 11/02/18 0338  . cefTRIAXone (ROCEPHIN)  IV       LOS: 0 days    Time spent:35 mins. More than 50% of that time was spent in counseling and/or coordination of care.      Shelly Coss, MD [facility] Pager [phone]  If 7PM-7AM, please contact night-coverage [email or web address] Password [email or web address] 11/02/2018, 1:23 PM

## 2018-11-03 ENCOUNTER — Ambulatory Visit: Payer: Self-pay | Admitting: Family Medicine

## 2018-11-03 DIAGNOSIS — R791 Abnormal coagulation profile: Secondary | ICD-10-CM

## 2018-11-03 DIAGNOSIS — F10231 Alcohol dependence with withdrawal delirium: Principal | ICD-10-CM

## 2018-11-03 LAB — GLUCOSE, CAPILLARY: Glucose-Capillary: 148 mg/dL — ABNORMAL HIGH (ref 70–99)

## 2018-11-03 LAB — HEMOGLOBIN AND HEMATOCRIT, BLOOD
HCT: 22.3 % — ABNORMAL LOW (ref 39.0–52.0)
Hemoglobin: 7.3 g/dL — ABNORMAL LOW (ref 13.0–17.0)

## 2018-11-03 LAB — CBC WITH DIFFERENTIAL/PLATELET
Abs Immature Granulocytes: 0.03 10*3/uL (ref 0.00–0.07)
Basophils Absolute: 0 10*3/uL (ref 0.0–0.1)
Basophils Relative: 0 %
Eosinophils Absolute: 0 10*3/uL (ref 0.0–0.5)
Eosinophils Relative: 1 %
HCT: 24.6 % — ABNORMAL LOW (ref 39.0–52.0)
Hemoglobin: 7.8 g/dL — ABNORMAL LOW (ref 13.0–17.0)
Immature Granulocytes: 1 %
Lymphocytes Relative: 11 %
Lymphs Abs: 0.6 10*3/uL — ABNORMAL LOW (ref 0.7–4.0)
MCH: 25.6 pg — ABNORMAL LOW (ref 26.0–34.0)
MCHC: 31.7 g/dL (ref 30.0–36.0)
MCV: 80.7 fL (ref 80.0–100.0)
Monocytes Absolute: 0.8 10*3/uL (ref 0.1–1.0)
Monocytes Relative: 15 %
Neutro Abs: 3.8 10*3/uL (ref 1.7–7.7)
Neutrophils Relative %: 72 %
Platelets: 144 10*3/uL — ABNORMAL LOW (ref 150–400)
RBC: 3.05 MIL/uL — ABNORMAL LOW (ref 4.22–5.81)
RDW: 17.1 % — ABNORMAL HIGH (ref 11.5–15.5)
WBC: 5.3 10*3/uL (ref 4.0–10.5)
nRBC: 6.2 % — ABNORMAL HIGH (ref 0.0–0.2)

## 2018-11-03 LAB — URINE CULTURE

## 2018-11-03 LAB — MRSA PCR SCREENING: MRSA by PCR: NEGATIVE

## 2018-11-03 LAB — PROTIME-INR
INR: 1.24
Prothrombin Time: 15.5 seconds — ABNORMAL HIGH (ref 11.4–15.2)

## 2018-11-03 LAB — LACTIC ACID, PLASMA: Lactic Acid, Venous: 3.1 mmol/L (ref 0.5–1.9)

## 2018-11-03 MED ORDER — FOLIC ACID 5 MG/ML IJ SOLN
1.0000 mg | Freq: Every day | INTRAMUSCULAR | Status: DC
  Filled 2018-11-03: qty 0.2

## 2018-11-03 MED ORDER — FENTANYL CITRATE (PF) 100 MCG/2ML IJ SOLN: 25.0000 ug | INTRAMUSCULAR | Status: DC | PRN

## 2018-11-03 MED ORDER — DEXTROSE-NACL 5-0.9 % IV SOLN
INTRAVENOUS | Status: DC
  Administered 2018-11-03 (×2): via INTRAVENOUS

## 2018-11-03 MED ORDER — FOLIC ACID 1 MG PO TABS
1.0000 mg | ORAL_TABLET | Freq: Every day | ORAL | Status: DC
  Administered 2018-11-03 – 2018-11-08 (×6): 1 mg via ORAL
  Filled 2018-11-03 (×6): qty 1

## 2018-11-03 MED ORDER — DEXMEDETOMIDINE HCL IN NACL 200 MCG/50ML IV SOLN
0.4000 ug/kg/h | INTRAVENOUS | Status: DC
  Filled 2018-11-03: qty 50

## 2018-11-03 MED ORDER — LORAZEPAM 2 MG/ML IJ SOLN: 1.0000 mg | INTRAMUSCULAR | Status: DC | PRN

## 2018-11-03 MED ORDER — DEXMEDETOMIDINE HCL IN NACL 400 MCG/100ML IV SOLN
0.4000 ug/kg/h | INTRAVENOUS | Status: DC
  Administered 2018-11-03: 0.5 ug/kg/h via INTRAVENOUS
  Administered 2018-11-03: 0.4 ug/kg/h via INTRAVENOUS
  Filled 2018-11-03 (×2): qty 100

## 2018-11-03 MED ORDER — LORAZEPAM 2 MG/ML IJ SOLN
1.0000 mg | INTRAMUSCULAR | Status: DC | PRN
  Administered 2018-11-03: 4 mg via INTRAVENOUS
  Administered 2018-11-05: 1 mg via INTRAVENOUS
  Filled 2018-11-03: qty 1
  Filled 2018-11-03: qty 2

## 2018-11-03 NOTE — Consult Note (Signed)
NAME:  Leonard Bentley, MRN:  [id number], DOB:  10-17-1961, LOS: 1 ADMISSION DATE:  11/01/2018, CONSULTATION DATE:  11/03/2018 REFERRING MD:  Dr. Tawanna Solo, [facility], CHIEF COMPLAINT:  Alcohol withdrawal   History   57 yo male smoker drinks one bottle of wine per [place] present with bruise on Rt thigh.  He is on chronic coumadin for hx of PE and MVR.  Found to have elevated INR.  Developed progressive agitation and tremors from alcohol withdrawal.  Not controlled with ativan and PCCM consulted to transfer to ICU.  Past Medical History  PE on coumadin, MVR, Non Hodgkin's lymphoma, Depression, Anxiety, HTN  Significant Hospital Events   12/26 Admit, [place] 12/27 transfer to ICU  Consults:    Procedures:    Significant Diagnostic Tests:  Rt leg doppler 12/26 >> no DVT  Micro Data:  Urine 12/25 >> multiple species Blood 12/26 >>   Antimicrobials:  Rocephin 12/25 >>   Interim history/subjective:    Objective   Blood pressure 106/79, pulse (!) 135, temperature 98 F (36.7 C), temperature source Oral, resp. rate (!) 33, height 5' 9.5" (1.765 m), weight 86.1 kg, SpO2 95 %.        Intake/Output Summary (Last 24 hours) at 11/03/2018 0851 Last data filed at 11/03/2018 9983 Gross per 24 hour  Intake -  Output 475 ml  Net -475 ml   Filed Weights   11/01/18 1809 11/02/18 0338  Weight: 86.2 kg 86.1 kg    Examination:  General - alert Eyes - pupils reactive ENT - no sinus tenderness, no stridor Cardiac - regular, tachycardic, 2/6 SM Chest - equal breath sounds b/l, no wheezing or rales Abdomen - soft, non tender, + bowel sounds, no hepatosplenomegaly GU - no lesions noted Extremities - bruise on Rt thigh Skin - no rashes Lymphatics - no lymphadenopathy Neuro - tremulous, follows simple commands, oriented to person and place but not time     Resolved Hospital Problem list     Assessment & Plan:   Acute alcohol withdrawal. Plan - transfer to ICU - add precedex - prn  ativan, fentanyl - RASS goal 0 to -1 - thiamine, folic acid, MVI - monitor electrolytes - continue IV fluids with dextrose  Anemia with Rt thigh bruise. Plan - f/u CBC - monitor size of Rt thigh bruise  Supra-therapeutic INR from coumadin therapy s/p Kcentra. Plan - f/u INR  Hx of HTN, PE, MVR. Plan - hold lopressor - hold coumadin  UTI. Plan - [place] 3/3 of rocephin >> d/c after dose on 12/27  Hx of Non Hodgkin's lymphoma. Plan - will need outpt f/u with oncology   Best practice:  Diet: clear liquids DVT prophylaxis: SCDs GI prophylaxis: not indicated Mobility: bed rest Code Status: full code Family Communication: no family at bedside   Labs   CBC: Recent Labs  Lab 11/01/18 1917 11/02/18 0152 11/03/18 0310  WBC 3.9* 3.5* 5.3  NEUTROABS 3.1  --  3.8  HGB 10.7* 9.1* 7.8*  HCT 33.2* 27.9* 24.6*  MCV 79.4* 79.0* 80.7  PLT 167 141* 144*    Basic Metabolic Panel: Recent Labs  Lab 11/01/18 1917 11/02/18 0152  NA 138 136  K 4.6 4.7  CL 100 103  CO2 23 24  GLUCOSE 109* 96  BUN 9 8  CREATININE 0.92 0.89  CALCIUM 9.2 8.4*  MG 1.6*  --    GFR: Estimated Creatinine Clearance: 92 mL/min (by C-G formula based on SCr of 0.89 mg/dL). Recent Labs  Lab 11/01/18 1917 11/02/18 0152 11/02/18 0424 11/03/18 0310  PROCALCITON  --  <0.10  --   --   WBC 3.9* 3.5*  --  5.3  LATICACIDVEN  --  1.5 2.2* 3.1*    Liver Function Tests: No results for input(s): AST, ALT, ALKPHOS, BILITOT, PROT, ALBUMIN in the last 168 hours. No results for input(s): LIPASE, AMYLASE in the last 168 hours. No results for input(s): AMMONIA in the last 168 hours.  ABG    Component Value Date/Time   PHART 7.340 (L) 10/27/2016 2156   PCO2ART 40.3 10/27/2016 2156   PO2ART 89.0 10/27/2016 2156   HCO3 21.7 10/27/2016 2156   TCO2 25 10/28/2016 1553   ACIDBASEDEF 4.0 (H) 10/27/2016 2156   O2SAT 96.0 10/27/2016 2156     Coagulation Profile: Recent Labs  Lab 11/01/18 2349  11/02/18 0539 11/02/18 1332 11/03/18 0310  INR >10.00* >10.00* 1.40 1.24    Cardiac Enzymes: No results for input(s): CKTOTAL, CKMB, CKMBINDEX, TROPONINI in the last 168 hours.  HbA1C: Hgb A1c MFr Bld  Date/Time Value Ref Range Status  10/25/2016 12:23 PM 5.2 4.8 - 5.6 % Final    Comment:    (NOTE)         Pre-diabetes: 5.7 - 6.4         Diabetes: >6.4         Glycemic control for adults with diabetes: <7.0   04/03/2016 03:25 AM 6.1 (H) 4.8 - 5.6 % Final    Comment:    (NOTE)         Pre-diabetes: 5.7 - 6.4         Diabetes: >6.4         Glycemic control for adults with diabetes: <7.0     CBG: No results for input(s): GLUCAP in the last 168 hours.  Review of Systems:   Unable to obtain due to altered mental status  Past Medical History  He,  has a past medical history of Acute pulmonary embolism ([place]) (04/11/2016), Anxiety, Arthritis, Atrial tachycardia ([place]) (01/06/2016), Depression, DTs (delirium tremens) ([place]), Dyspnea, ED (erectile dysfunction), Hypertension, Lymphoma, non Hodgkin's (09/25/2011), Mitral regurgitation (01/20/2016), [place] I (10/09/2015), Murmur (01/06/2016), Occasional tremors, PAC (premature atrial contraction) (10/09/2015), and S/P minimally-invasive mitral valve repair (10/27/2016).   Surgical History    Past Surgical History:  Procedure Laterality Date  . CARDIAC CATHETERIZATION N/A 08/13/2016   Procedure: Right/Left Heart Cath and Coronary Angiography;  Surgeon: [name], MD;  Location: [facility];  Service: Cardiovascular;  Laterality: N/A;  . MITRAL VALVE REPAIR Right 10/27/2016   Procedure: MINIMALLY INVASIVE MITRAL VALVE REPAIR (MVR) USING 34 PHYSIO II ANNULOPLASTY RING;  Surgeon: [name], MD;  Location: [facility];  Service: Open Heart Surgery;  Laterality: Right;  . NO PAST SURGERIES    . SKIN BIOPSY Right 04/04/2018   right mid medial anterior tibial shave  see report in chart  . TEE WITHOUT CARDIOVERSION N/A 02/11/2016    Procedure: TRANSESOPHAGEAL ECHOCARDIOGRAM (TEE);  Surgeon: [name], MD;  Location: [facility];  Service: Cardiovascular;  Laterality: N/A;  . TEE WITHOUT CARDIOVERSION N/A 10/27/2016   Procedure: TRANSESOPHAGEAL ECHOCARDIOGRAM (TEE);  Surgeon: [name], MD;  Location: [facility];  Service: Open Heart Surgery;  Laterality: N/A;  . TRIGGER FINGER RELEASE Bilateral      Social History   reports that he has been smoking cigarettes and e-cigarettes. He has a 40.00 pack-year smoking history. He quit smokeless tobacco use about 35 years ago.  He reports current alcohol use. He reports that he does not use drugs.   Family History   His family history includes Cancer in his father and mother; Hypertension in his maternal grandmother; Stroke in his maternal aunt. There is no history of Heart attack.   Allergies Allergies  Allergen Reactions  . Other Other (See Comments)    "Cactus" blisters on tongue     Home Medications  Prior to Admission medications   Medication Sig Start Date End Date Taking? Authorizing Provider  acetaminophen (TYLENOL) 650 MG CR tablet Take 650 mg by mouth every 8 (eight) hours as needed for pain.   Yes [provider]  metoprolol tartrate (LOPRESSOR) 25 MG tablet Take 1 tablet (25 mg total) by mouth 2 (two) times daily. Patient taking differently: Take 25 mg by mouth daily as needed (weakness/dizziness).  06/12/18  Yes Danford, Suann Larry, MD  tadalafil (ADCIRCA/CIALIS) 20 MG tablet Take 20 mg by mouth daily as needed for erectile dysfunction.   Yes [provider]  tetrahydrozoline (REDNESS RELIEVER EYE DROPS) 0.05 % ophthalmic solution Place 1 drop into both eyes daily as needed (red eyes).    Yes [provider]  triamcinolone cream (KENALOG) 0.1 % Apply 1 application topically 2 (two) times daily. Patient taking differently: Apply 1 application topically every 4 (four) hours as needed (bruising).  02/13/18  Yes Tysinger, Camelia Eng,  PA-C  warfarin (COUMADIN) 5 MG tablet Take 5 mg by mouth at bedtime.    Yes [provider]  chlordiazePOXIDE (LIBRIUM) 5 MG capsule Please dispense 9 pills -  Take 1 pill two times a [place] for 3 days, then Take 1 pill once a [place] for 3 days and stop. Patient not taking: Reported on 08/30/2018 07/30/18   [name], MD  folic acid (FOLVITE) 1 MG tablet Take 1 tablet (1 mg total) by mouth daily. Patient not taking: Reported on 08/30/2018 07/31/18   [name], MD  nicotine (NICODERM CQ - DOSED IN MG/24 HOURS) 14 mg/24hr patch Place 1 patch (14 mg total) onto the skin daily. Patient not taking: Reported on 08/30/2018 07/31/18   [name], MD  thiamine 100 MG tablet Take 1 tablet (100 mg total) by mouth daily. Patient not taking: Reported on 08/30/2018 07/31/18   [name], MD     Critical care time: 58 minutes    Chesley Mires, MD [place] 11/03/2018, 9:03 AM

## 2018-11-03 NOTE — Progress Notes (Signed)
Patient having varying episodes of orientation as well as anxiety and is sometimes more calm and sometimes very agitated, cussing and trying to pull restraints loose and get out of bed. States he is going home or to various random places in the community. Bilateral wrists and arms are red due to pulling on restraints. Have instructed patient multiple times on skin and tissue damage potential and at times he verbalizes understanding will remain calm but does return to agitated and pulling state soon after.

## 2018-11-03 NOTE — Progress Notes (Signed)
Patient continues trying to get out on bed,becoming more confused,agitated and become aggressive to staff inspite of PRN ativan was given. Patient family (broher) called and Leonard Killings NP.made aware with order to put on restraint. Bilateral both upper and lower extremities restraints applied.Will continue to monitor closely.

## 2018-11-03 NOTE — Progress Notes (Signed)
PROGRESS NOTE    Leonard Bentley  IRW:[id number] DOB: 08/05/1961 DOA: 11/01/2018 PCP: [name], NP   Brief Narrative: Patient is a 57 year old male with past medical history of pulmonary embolism on Coumadin, non-Hodgkin's lymphoma, mitral valve regurgitation status post repair, tobacco and alcohol abuse, depression, anxiety, hypertension who presented to the emergency room with complaints of pain and ecchymosis on his right medial thigh.  He was found to have supratherapeutic INR on presentation.  His INR was more than 10 .  Reversed with Kcentra and vitamin K.  This morning he was found to be in persistent delirium tremens despite receiving several doses of Ativan.  I requested for PCCM consultation and he was moved to ICU for Precedex drip.  Assessment & Plan:   Principal Problem:   Supratherapeutic INR Active Problems:   Anemia   Cigarette smoker   History of pulmonary embolus (PE)   S/P minimally-invasive mitral valve repair   Alcohol withdrawal (HCC)   Tachyarrhythmia   Hypomagnesemia   HTN (hypertension)   UTI (urinary tract infection)  Delirium tremens: History of chronic alcohol abuse.  Found to be withdrawing since yesterday.  Remained agitated despite several dose of Ativan.  Moved to ICU for Precedex.  ICU team taking over.  Supratherapeutic INR:Has large area of ecchymosis in the right medial thigh suggestive of hematoma.  He has this for last 1 month.  INR reversed with Kcentra and vitamin K IV Also was found to have hematuria. Patient on Coumadin at home for history of PE.  Looks like he is not compliant with his medications, and might have taken additional doses accidentally which he does not remember. He is has history of alcohol abuse.  Acute kidney injury: Expect improvement with IV fluids.  Will check BMP tomorrow  Lactic acidosis: Slightly worsened today.  Continue IV fluids  History of pulmonary embolism: On Coumadin at home.  Continue on  discharge.  Chronic alcohol abuse: Drinks half a bottle of wine every [place].  Continue thiamine and folic acid.  Suspected urinary tract infection: Denies any dysuria, but patient is a poor historian .Urinalysis suggestive of  urinary tract infection.  Started on Rocephin.  Follow-up blood culture, urine culture.Consider discontinuing  the antibiotics soon.  Anemia: Mild.  Could be associated  with left thigh hematoma.  Continue to monitor.  Tobacco abuse: Continue nicotine patch  Mitral regurgitation: Status post minimally invasive mitral valve repair.  Sinus tachycardia: Secondary to alcohol withdrawal.  On metoprolol.  Hypomagnesemia: Supplemented  Hypertension: Compounded by alcohol withdrawal.  Continue current medications along with as needed meds .  Debility/deconditioning: Requested for PT evaluation  We will sign off. Patient is under PCCM service now .Please call us when he is appropriate for [facility] transfer.          DVT prophylaxis:INR was supratherapeutic Code Status: Full Family Communication: None present at the bedside Disposition Plan: Likely home after resolution of alcohol withdrawal/DTs  Procedures:None  Antimicrobials: Ceftriaxone  Subjective: Patient seen and examined the bedside this morning.He is on full blown DTs.  Remains tremulous, tachycardic.Agitated  Objective: Vitals:   11/03/18 0805 11/03/18 0946 11/03/18 1015 11/03/18 1100  BP: 106/79  (!) 135/95 109/84  Pulse: (!) 135 (!) 115 (!) 112 100  Resp: (!) 33  (!) 26 (!) 22  Temp: 98 F (36.7 C)  98.6 F (37 C)   TempSrc: Oral  Oral   SpO2: 95%  99% 99%  Weight:      Height:  Intake/Output Summary (Last 24 hours) at 11/03/2018 1136 Last data filed at 11/03/2018 1100 Gross per 24 hour  Intake 3265.36 ml  Output 475 ml  Net 2790.36 ml   Filed Weights   11/01/18 1809 11/02/18 0338  Weight: 86.2 kg 86.1 kg    Examination:  General exam:Not in  distress,agitated,anxious HEENT:PERRL,Oral mucosa moist, Ear/Nose normal on gross exam Respiratory system: Bilateral equal air entry, normal vesicular breath sounds, no wheezes or crackles  Cardiovascular system: Sinus tachycardia, RRR. No JVD, murmurs, rubs, gallops or clicks. No pedal edema. Gastrointestinal system: Abdomen is nondistended, soft and nontender. No organomegaly or masses felt. Normal bowel sounds heard. Central nervous system: Alert and oriented. No focal neurological deficits. Extremities: No edema, no clubbing ,no cyanosis, distal peripheral pulses palpable. Ecchymosis on the left medial thigh Skin: No ulcers,no icterus ,no pallor MSK: Normal muscle bulk,tone ,power  Data Reviewed: I have personally reviewed following labs and imaging studies  CBC: Recent Labs  Lab 11/01/18 1917 11/02/18 0152 11/03/18 0310  WBC 3.9* 3.5* 5.3  NEUTROABS 3.1  --  3.8  HGB 10.7* 9.1* 7.8*  HCT 33.2* 27.9* 24.6*  MCV 79.4* 79.0* 80.7  PLT 167 141* 270*   Basic Metabolic Panel: Recent Labs  Lab 11/01/18 1917 11/02/18 0152  NA 138 136  K 4.6 4.7  CL 100 103  CO2 23 24  GLUCOSE 109* 96  BUN 9 8  CREATININE 0.92 0.89  CALCIUM 9.2 8.4*  MG 1.6*  --    GFR: Estimated Creatinine Clearance: 92 mL/min (by C-G formula based on SCr of 0.89 mg/dL). Liver Function Tests: No results for input(s): AST, ALT, ALKPHOS, BILITOT, PROT, ALBUMIN in the last 168 hours. No results for input(s): LIPASE, AMYLASE in the last 168 hours. No results for input(s): AMMONIA in the last 168 hours. Coagulation Profile: Recent Labs  Lab 11/01/18 2349 11/02/18 0539 11/02/18 1332 11/03/18 0310  INR >10.00* >10.00* 1.40 1.24   Cardiac Enzymes: No results for input(s): CKTOTAL, CKMB, CKMBINDEX, TROPONINI in the last 168 hours. BNP (last 3 results) No results for input(s): PROBNP in the last 8760 hours. HbA1C: No results for input(s): HGBA1C in the last 72 hours. CBG: No results for input(s):  GLUCAP in the last 168 hours. Lipid Profile: No results for input(s): CHOL, HDL, LDLCALC, TRIG, CHOLHDL, LDLDIRECT in the last 72 hours. Thyroid Function Tests: No results for input(s): TSH, T4TOTAL, FREET4, T3FREE, THYROIDAB in the last 72 hours. Anemia Panel: No results for input(s): VITAMINB12, FOLATE, FERRITIN, TIBC, IRON, RETICCTPCT in the last 72 hours. Sepsis Labs: Recent Labs  Lab 11/02/18 0152 11/02/18 0424 11/03/18 0310  PROCALCITON <0.10  --   --   LATICACIDVEN 1.5 2.2* 3.1*    Recent Results (from the past 240 hour(s))  Urine culture     Status: Abnormal   Collection Time: 11/01/18 10:48 PM  Result Value Ref Range Status   Specimen Description URINE, RANDOM  Final   Special Requests   Final    NONE Performed at [facility], [address] [place], [place] [address]    Culture MULTIPLE SPECIES PRESENT, SUGGEST RECOLLECTION (A)  Final   Report Status 11/03/2018 FINAL  Final         Radiology Studies: Dg Chest 2 View  Result Date: 11/01/2018 CLINICAL DATA:  Right leg groin pain.  History of blood clots. EXAM: CHEST - 2 VIEW COMPARISON:  08/30/2018, 11/19/2016 and CT 11/27/2016 as well as CT 06/05/2018 FINDINGS: Lungs are adequately inflated without  focal airspace consolidation or effusion. Subtle hazy reticulonodular density over the mid to lower lungs without significant change as demonstrated on previous CT scans. Stable prominence of the right hilar region. Borderline stable cardiomegaly. Remainder of the exam is unchanged. IMPRESSION: No acute cardiopulmonary disease. Mild stable hazy reticulonodular density over the mid to lower lungs as demonstrated on previous CT scans thought to represent chronic inflammatory versus atypical infectious process. Electronically Signed   By: Marin Olp M.D.   On: 11/01/2018 20:01   Dg Ankle 2 Views Left  Result Date: 11/02/2018 CLINICAL DATA:  Subacute onset of left medial ankle pain, swelling and bruising. EXAM:  LEFT ANKLE - 2 VIEW COMPARISON:  Left ankle radiographs performed 02/10/2018 FINDINGS: A tiny osseous fragment dorsal to the anterior talus is new from April and may reflect an avulsion fracture. The ankle mortise is intact; the interosseous space is within normal limits. No talar tilt or subluxation is seen. The joint spaces are preserved. Soft tissue swelling is noted dorsal to the anterior talus. IMPRESSION: Tiny osseous fragment dorsal to the anterior talus is new from April and may reflect an avulsion fracture. Electronically Signed   By: Garald Balding M.D.   On: 11/02/2018 03:10   Vas [place] Lower Extremity Venous (dvt)  Result Date: 11/02/2018  Lower Venous Study Indications: Swelling, and Pain.  Risk Factors: History of PE and non-Hodgkin lymphoma. Anticoagulation: Coumadin. Performing Technologist: Oda Cogan RDMS, RVT  Examination Guidelines: A complete evaluation includes B-mode imaging, spectral Doppler, color Doppler, and power Doppler as needed of all accessible portions of each vessel. Bilateral testing is considered an integral part of a complete examination. Limited examinations for reoccurring indications may be performed as noted.  Right Venous Findings: +---------+---------------+---------+-----------+----------+-------+          CompressibilityPhasicitySpontaneityPropertiesSummary +---------+---------------+---------+-----------+----------+-------+ CFV      Full           Yes      Yes                          +---------+---------------+---------+-----------+----------+-------+ SFJ      Full                                                 +---------+---------------+---------+-----------+----------+-------+ FV Prox  Full                                                 +---------+---------------+---------+-----------+----------+-------+ FV Mid   Full                                                  +---------+---------------+---------+-----------+----------+-------+ FV DistalFull                                                 +---------+---------------+---------+-----------+----------+-------+ PFV      Full                                                 +---------+---------------+---------+-----------+----------+-------+  POP      Full           Yes      Yes                          +---------+---------------+---------+-----------+----------+-------+ PTV      Full                                                 +---------+---------------+---------+-----------+----------+-------+ PERO     Full                                                 +---------+---------------+---------+-----------+----------+-------+ GSV      Full                                                 +---------+---------------+---------+-----------+----------+-------+    Summary: Right: There is no evidence of deep vein thrombosis in the lower extremity. No cystic structure found in the popliteal fossa.  *See table(s) above for measurements and observations. Electronically signed by Monica Martinez MD on 11/02/2018 at 2:06:32 PM.    Final         Scheduled Meds: . folic acid  1 mg Oral Daily  . multivitamin with minerals  1 tablet Oral Daily  . nicotine  21 mg Transdermal Daily  . thiamine  100 mg Oral Daily   Continuous Infusions: . cefTRIAXone (ROCEPHIN)  IV Stopped (11/02/18 2202)  . dexmedetomidine (PRECEDEX) IV infusion 0.5 mcg/kg/hr (11/03/18 1100)  . dextrose 5 % and 0.9% NaCl 100 mL/hr at 11/03/18 1100     LOS: 1 [place]    Time spent:35 mins. More than 50% of that time was spent in counseling and/or coordination of care.      Shelly Coss, MD [facility] Pager [phone]  If 7PM-7AM, please contact night-coverage [email or web address] Password [facility] 11/03/2018, 11:36 AM

## 2018-11-03 NOTE — Progress Notes (Signed)
Patient transferred to [facility] ICU with all personal belongings.

## 2018-11-04 DIAGNOSIS — I10 Essential (primary) hypertension: Secondary | ICD-10-CM

## 2018-11-04 DIAGNOSIS — D630 Anemia in neoplastic disease: Secondary | ICD-10-CM

## 2018-11-04 LAB — PROTIME-INR
INR: 1.23
Prothrombin Time: 15.4 seconds — ABNORMAL HIGH (ref 11.4–15.2)

## 2018-11-04 LAB — COMPREHENSIVE METABOLIC PANEL
ALT: 104 U/L — ABNORMAL HIGH (ref 0–44)
AST: 129 U/L — ABNORMAL HIGH (ref 15–41)
Albumin: 3.1 g/dL — ABNORMAL LOW (ref 3.5–5.0)
Alkaline Phosphatase: 76 U/L (ref 38–126)
Anion gap: 9 (ref 5–15)
BUN: 5 mg/dL — ABNORMAL LOW (ref 6–20)
CO2: 26 mmol/L (ref 22–32)
Calcium: 8.6 mg/dL — ABNORMAL LOW (ref 8.9–10.3)
Chloride: 104 mmol/L (ref 98–111)
Creatinine, Ser: 0.81 mg/dL (ref 0.61–1.24)
GFR calc Af Amer: >60 mL/min (ref >60)
GFR calc non Af Amer: >60 mL/min (ref >60)
Glucose, Bld: 123 mg/dL — ABNORMAL HIGH (ref 70–99)
Potassium: 3.8 mmol/L (ref 3.5–5.1)
Sodium: 139 mmol/L (ref 135–145)
Total Bilirubin: 1.5 mg/dL — ABNORMAL HIGH (ref 0.3–1.2)
Total Protein: 5.9 g/dL — ABNORMAL LOW (ref 6.5–8.1)

## 2018-11-04 LAB — CBC
HCT: 25.4 % — ABNORMAL LOW (ref 39.0–52.0)
Hemoglobin: 8.2 g/dL — ABNORMAL LOW (ref 13.0–17.0)
MCH: 26.5 pg (ref 26.0–34.0)
MCHC: 32.3 g/dL (ref 30.0–36.0)
MCV: 82.2 fL (ref 80.0–100.0)
Platelets: 170 10*3/uL (ref 150–400)
RBC: 3.09 MIL/uL — ABNORMAL LOW (ref 4.22–5.81)
RDW: 17.8 % — ABNORMAL HIGH (ref 11.5–15.5)
WBC: 4.6 10*3/uL (ref 4.0–10.5)
nRBC: 8.3 % — ABNORMAL HIGH (ref 0.0–0.2)

## 2018-11-04 LAB — PHOSPHORUS: Phosphorus: 3.4 mg/dL (ref 2.5–4.6)

## 2018-11-04 LAB — MAGNESIUM: Magnesium: 1.7 mg/dL (ref 1.7–2.4)

## 2018-11-04 LAB — GLUCOSE, CAPILLARY: Glucose-Capillary: 109 mg/dL — ABNORMAL HIGH (ref 70–99)

## 2018-11-04 MED ORDER — MAGNESIUM SULFATE 2 GM/50ML IV SOLN
2.0000 g | Freq: Once | INTRAVENOUS | Status: AC
  Administered 2018-11-04: 2 g via INTRAVENOUS
  Filled 2018-11-04: qty 50

## 2018-11-04 MED ORDER — WHITE PETROLATUM EX OINT
TOPICAL_OINTMENT | CUTANEOUS | Status: AC
  Administered 2018-11-04: 18:00:00
  Filled 2018-11-04: qty 28.35

## 2018-11-04 MED ORDER — METOPROLOL TARTRATE 12.5 MG HALF TABLET
12.5000 mg | ORAL_TABLET | Freq: Two times a day (BID) | ORAL | Status: DC
  Administered 2018-11-04 – 2018-11-08 (×8): 12.5 mg via ORAL
  Filled 2018-11-04 (×9): qty 1

## 2018-11-04 MED ORDER — CHLORDIAZEPOXIDE HCL 5 MG PO CAPS
5.0000 mg | ORAL_CAPSULE | Freq: Four times a day (QID) | ORAL | Status: DC
  Administered 2018-11-04 (×4): 5 mg via ORAL
  Filled 2018-11-04 (×4): qty 1

## 2018-11-04 NOTE — Progress Notes (Signed)
NAME:  Leonard Bentley, MRN:  [id number], DOB:  May 13, 1961, LOS: 2 ADMISSION DATE:  11/01/2018, CONSULTATION DATE:  11/03/2018 REFERRING MD:  Dr. Tawanna Solo, [facility], CHIEF COMPLAINT:  Alcohol withdrawal   History   57 yo male smoker drinks one bottle of wine per [place] present with bruise on Rt thigh.  He is on chronic coumadin for hx of PE and MVR.  Found to have elevated INR.  Developed progressive agitation and tremors from alcohol withdrawal.  Not controlled with ativan and PCCM consulted to transfer to ICU.  Past Medical History  PE on coumadin, MVR, Non Hodgkin's lymphoma, Depression, Anxiety, HTN  Significant Hospital Events   12/26 Admit, [facility] 12/27 transfer to ICU  Consults:    Procedures:    Significant Diagnostic Tests:  Rt leg doppler 12/26 >> no DVT  Micro Data:  Urine 12/25 >> multiple species Blood 12/26 >>   Antimicrobials:  Rocephin 12/25 >>   Interim history/subjective:  Agitated overnight and started on precedex  Objective   Blood pressure (!) 116/98, pulse 67, temperature 98.9 F (37.2 C), temperature source Oral, resp. rate (!) 33, height 5' 9.5" (1.765 m), weight 87.7 kg, SpO2 100 %.        Intake/Output Summary (Last 24 hours) at 11/04/2018 0930 Last data filed at 11/04/2018 0800 Gross per 24 hour  Intake 5562.28 ml  Output 1605 ml  Net 3957.28 ml   Filed Weights   11/01/18 1809 11/02/18 0338 11/04/18 0603  Weight: 86.2 kg 86.1 kg 87.7 kg    Examination:  General - Sedate, acutely ill appearing, NAD HEENT: [place]/AT, PERRL, EOM-I and MMM Cardiac - RRR, Nl S1/S2 and -M/R/G Chest - CTA bilaterally Abdomen - Soft, NT, ND and +BS Extremities - bruise on Rt thigh Skin - Intact Lymphatics - no lymphadenopathy Neuro - Arousable, moving all ext to command  I reviewed CXR myself, no acute disease noted  Resolved Hospital Problem list     Assessment & Plan:   Acute alcohol withdrawal. Plan - Transfer to progressive care - D/C precedex -  PRN ativan, fentanyl - RASS goal 0 to -1 - Thiamine, folic acid, MVI - Monitor electrolytes - IVF as ordered - Schedule librium added low dose  Anemia with Rt thigh bruise. Plan - F/U CBC - Monitor bruising of the right thigh  Supra-therapeutic INR from coumadin therapy s/p Kcentra. Plan - INR in AM  Hx of HTN, PE, MVR. Plan - Restart PO lopressor at a low dose - hold coumadin  UTI. Plan - [place] 3/3 of rocephin >> d/c after dose on 12/27  Hx of Non Hodgkin's lymphoma. Plan - Will need outpt f/u with oncology  Labs   CBC: Recent Labs  Lab 11/01/18 1917 11/02/18 0152 11/03/18 0310 11/03/18 1650 11/04/18 0241  WBC 3.9* 3.5* 5.3  --  4.6  NEUTROABS 3.1  --  3.8  --   --   HGB 10.7* 9.1* 7.8* 7.3* 8.2*  HCT 33.2* 27.9* 24.6* 22.3* 25.4*  MCV 79.4* 79.0* 80.7  --  82.2  PLT 167 141* 144*  --  378   Basic Metabolic Panel: Recent Labs  Lab 11/01/18 1917 11/02/18 0152 11/04/18 0241  NA 138 136 139  K 4.6 4.7 3.8  CL 100 103 104  CO2 23 24 26   GLUCOSE 109* 96 123*  BUN 9 8 5*  CREATININE 0.92 0.89 0.81  CALCIUM 9.2 8.4* 8.6*  MG 1.6*  --  1.7  PHOS  --   --  3.4   GFR: Estimated Creatinine Clearance: 110 mL/min (by C-G formula based on SCr of 0.81 mg/dL). Recent Labs  Lab 11/01/18 1917 11/02/18 0152 11/02/18 0424 11/03/18 0310 11/04/18 0241  PROCALCITON  --  <0.10  --   --   --   WBC 3.9* 3.5*  --  5.3 4.6  LATICACIDVEN  --  1.5 2.2* 3.1*  --    Liver Function Tests: Recent Labs  Lab 11/04/18 0241  AST 129*  ALT 104*  ALKPHOS 76  BILITOT 1.5*  PROT 5.9*  ALBUMIN 3.1*   No results for input(s): LIPASE, AMYLASE in the last 168 hours. No results for input(s): AMMONIA in the last 168 hours.  ABG    Component Value Date/Time   PHART 7.340 (L) 10/27/2016 2156   PCO2ART 40.3 10/27/2016 2156   PO2ART 89.0 10/27/2016 2156   HCO3 21.7 10/27/2016 2156   TCO2 25 10/28/2016 1553   ACIDBASEDEF 4.0 (H) 10/27/2016 2156   O2SAT 96.0 10/27/2016 2156    Coagulation Profile: Recent Labs  Lab 11/01/18 2349 11/02/18 0539 11/02/18 1332 11/03/18 0310 11/04/18 0241  INR >10.00* >10.00* 1.40 1.24 1.23   Cardiac Enzymes: No results for input(s): CKTOTAL, CKMB, CKMBINDEX, TROPONINI in the last 168 hours.  HbA1C: Hgb A1c MFr Bld  Date/Time Value Ref Range Status  10/25/2016 12:23 PM 5.2 4.8 - 5.6 % Final    Comment:    (NOTE)         Pre-diabetes: 5.7 - 6.4         Diabetes: >6.4         Glycemic control for adults with diabetes: <7.0   04/03/2016 03:25 AM 6.1 (H) 4.8 - 5.6 % Final    Comment:    (NOTE)         Pre-diabetes: 5.7 - 6.4         Diabetes: >6.4         Glycemic control for adults with diabetes: <7.0    CBG: Recent Labs  Lab 11/03/18 2025 11/04/18 0342  GLUCAP 148* 109*   Review of Systems:   Unable to obtain due to altered mental status  Past Medical History  He,  has a past medical history of Acute pulmonary embolism ([place]) (04/11/2016), Anxiety, Arthritis, Atrial tachycardia ([place]) (01/06/2016), Depression, DTs (delirium tremens) ([place]), Dyspnea, ED (erectile dysfunction), Hypertension, Lymphoma, non Hodgkin's (09/25/2011), Mitral regurgitation (01/20/2016), Mobitz I (10/09/2015), Murmur (01/06/2016), Occasional tremors, PAC (premature atrial contraction) (10/09/2015), and S/P minimally-invasive mitral valve repair (10/27/2016).   Surgical History    Past Surgical History:  Procedure Laterality Date  . CARDIAC CATHETERIZATION N/A 08/13/2016   Procedure: Right/Left Heart Cath and Coronary Angiography;  Surgeon: [name], MD;  Location: [facility];  Service: Cardiovascular;  Laterality: N/A;  . MITRAL VALVE REPAIR Right 10/27/2016   Procedure: MINIMALLY INVASIVE MITRAL VALVE REPAIR (MVR) USING 34 PHYSIO II ANNULOPLASTY RING;  Surgeon: [name], MD;  Location: [facility];  Service: Open Heart Surgery;  Laterality: Right;  . NO PAST SURGERIES    . SKIN BIOPSY Right 04/04/2018   right mid medial  anterior tibial shave  see report in chart  . TEE WITHOUT CARDIOVERSION N/A 02/11/2016   Procedure: TRANSESOPHAGEAL ECHOCARDIOGRAM (TEE);  Surgeon: [name], MD;  Location: [facility];  Service: Cardiovascular;  Laterality: N/A;  . TEE WITHOUT CARDIOVERSION N/A 10/27/2016   Procedure: TRANSESOPHAGEAL ECHOCARDIOGRAM (TEE);  Surgeon: [name], MD;  Location: [facility];  Service: Open Heart Surgery;  Laterality: N/A;  . TRIGGER FINGER RELEASE Bilateral    Social History   reports that he has been smoking cigarettes and e-cigarettes. He has a 40.00 pack-year smoking history. He quit smokeless tobacco use about 35 years ago. He reports current alcohol use. He reports that he does not use drugs.   Family History   His family history includes Cancer in his father and mother; Hypertension in his maternal grandmother; Stroke in his maternal aunt. There is no history of Heart attack.   Allergies Allergies  Allergen Reactions  . Other Other (See Comments)    "Cactus" blisters on tongue   Home Medications  Prior to Admission medications   Medication Sig Start Date End Date Taking? Authorizing Provider  acetaminophen (TYLENOL) 650 MG CR tablet Take 650 mg by mouth every 8 (eight) hours as needed for pain.   Yes [provider]  metoprolol tartrate (LOPRESSOR) 25 MG tablet Take 1 tablet (25 mg total) by mouth 2 (two) times daily. Patient taking differently: Take 25 mg by mouth daily as needed (weakness/dizziness).  06/12/18  Yes Danford, Suann Larry, MD  tadalafil (ADCIRCA/CIALIS) 20 MG tablet Take 20 mg by mouth daily as needed for erectile dysfunction.   Yes [provider]  tetrahydrozoline (REDNESS RELIEVER EYE DROPS) 0.05 % ophthalmic solution Place 1 drop into both eyes daily as needed (red eyes).    Yes [provider]  triamcinolone cream (KENALOG) 0.1 % Apply 1 application topically 2 (two) times daily. Patient taking differently: Apply 1 application  topically every 4 (four) hours as needed (bruising).  02/13/18  Yes Tysinger, Camelia Eng, PA-C  warfarin (COUMADIN) 5 MG tablet Take 5 mg by mouth at bedtime.    Yes [provider]  chlordiazePOXIDE (LIBRIUM) 5 MG capsule Please dispense 9 pills -  Take 1 pill two times a [place] for 3 days, then Take 1 pill once a [place] for 3 days and stop. Patient not taking: Reported on 08/30/2018 07/30/18   [name], MD  folic acid (FOLVITE) 1 MG tablet Take 1 tablet (1 mg total) by mouth daily. Patient not taking: Reported on 08/30/2018 07/31/18   [name], MD  nicotine (NICODERM CQ - DOSED IN MG/24 HOURS) 14 mg/24hr patch Place 1 patch (14 mg total) onto the skin daily. Patient not taking: Reported on 08/30/2018 07/31/18   [name], MD  thiamine 100 MG tablet Take 1 tablet (100 mg total) by mouth daily. Patient not taking: Reported on 08/30/2018 07/31/18   [name], MD    Discussed with TRH-MD Transfer to SDU and to [facility] service with PCCM off 12/29  Rush Farmer, M.D. [facility] Pulmonary/Critical Care Medicine. Pager: [phone]. After hours pager: [phone].

## 2018-11-05 LAB — MAGNESIUM: Magnesium: 1.8 mg/dL (ref 1.7–2.4)

## 2018-11-05 LAB — CBC
HCT: 24.8 % — ABNORMAL LOW (ref 39.0–52.0)
Hemoglobin: 7.8 g/dL — ABNORMAL LOW (ref 13.0–17.0)
MCH: 25.9 pg — ABNORMAL LOW (ref 26.0–34.0)
MCHC: 31.5 g/dL (ref 30.0–36.0)
MCV: 82.4 fL (ref 80.0–100.0)
Platelets: 184 10*3/uL (ref 150–400)
RBC: 3.01 MIL/uL — ABNORMAL LOW (ref 4.22–5.81)
RDW: 18.1 % — ABNORMAL HIGH (ref 11.5–15.5)
WBC: 3.9 10*3/uL — ABNORMAL LOW (ref 4.0–10.5)
nRBC: 9.9 % — ABNORMAL HIGH (ref 0.0–0.2)

## 2018-11-05 LAB — BASIC METABOLIC PANEL
Anion gap: 9 (ref 5–15)
BUN: 8 mg/dL (ref 6–20)
CO2: 25 mmol/L (ref 22–32)
Calcium: 8.5 mg/dL — ABNORMAL LOW (ref 8.9–10.3)
Chloride: 105 mmol/L (ref 98–111)
Creatinine, Ser: 0.98 mg/dL (ref 0.61–1.24)
GFR calc Af Amer: >60 mL/min (ref >60)
GFR calc non Af Amer: >60 mL/min (ref >60)
Glucose, Bld: 96 mg/dL (ref 70–99)
Potassium: 3.5 mmol/L (ref 3.5–5.1)
Sodium: 139 mmol/L (ref 135–145)

## 2018-11-05 LAB — PHOSPHORUS: Phosphorus: 3.4 mg/dL (ref 2.5–4.6)

## 2018-11-05 LAB — PROTIME-INR
INR: 1.35
Prothrombin Time: 16.6 seconds — ABNORMAL HIGH (ref 11.4–15.2)

## 2018-11-05 MED ORDER — WARFARIN - PHARMACIST DOSING INPATIENT
Freq: Every day | Status: DC
  Administered 2018-11-05: 19:00:00

## 2018-11-05 MED ORDER — WARFARIN SODIUM 5 MG PO TABS
5.0000 mg | ORAL_TABLET | Freq: Once | ORAL | Status: AC
  Administered 2018-11-05: 5 mg via ORAL
  Filled 2018-11-05: qty 1

## 2018-11-05 MED ORDER — NICOTINE 21 MG/24HR TD PT24
21.0000 mg | MEDICATED_PATCH | Freq: Every day | TRANSDERMAL | Status: DC
  Administered 2018-11-05 – 2018-11-07 (×3): 21 mg via TRANSDERMAL
  Filled 2018-11-05 (×4): qty 1

## 2018-11-05 MED ORDER — CHLORDIAZEPOXIDE HCL 5 MG PO CAPS
5.0000 mg | ORAL_CAPSULE | Freq: Two times a day (BID) | ORAL | Status: DC
  Administered 2018-11-05 – 2018-11-08 (×7): 5 mg via ORAL
  Filled 2018-11-05 (×7): qty 1

## 2018-11-05 NOTE — Progress Notes (Signed)
Pt transferred to 5W without complication.

## 2018-11-05 NOTE — Progress Notes (Signed)
ANTICOAGULATION CONSULT NOTE - Initial Consult  Pharmacy Consult for warfarin Indication: pulmonary embolus and MVR  Allergies  Allergen Reactions  . Other Other (See Comments)    "Cactus" blisters on tongue    Patient Measurements: Height: 5' 9.5" (176.5 cm) Weight: 193 lb 5.5 oz (87.7 kg) IBW/kg (Calculated) : 71.85  Vital Signs: Temp: 98.2 F (36.8 C) (12/29 0700) Temp Source: Oral (12/29 0700) BP: 108/88 (12/29 0800) Pulse Rate: 103 (12/29 0800)  Labs: Recent Labs    11/02/18 1332  11/03/18 0310 11/03/18 1650 11/04/18 0241 11/05/18 0433  HGB  --    < > 7.8* 7.3* 8.2* 7.8*  HCT  --    < > 24.6* 22.3* 25.4* 24.8*  PLT  --   --  144*  --  170 184  LABPROT 17.0*  --  15.5*  --  15.4*  --   INR 1.40  --  1.24  --  1.23  --   CREATININE  --   --   --   --  0.81 0.98   < > = values in this interval not displayed.    Estimated Creatinine Clearance: 90.9 mL/min (by C-G formula based on SCr of 0.98 mg/dL).  Assessment: CC/HPI: 57 yo m presenting initially with hematoma to right thigh, then developed tremors from etoh withdrawal  PMH: PE MVR, NHL, Depression, HTN  Anticoag:   Coumadin PTA for PE history (2014/2017), placed on Warfarin after MVR repair (no issues with Xarelto)  -12/26: INR > 10 on admit and repeat INR > 10, s/p Vit K 5 mg po, Kcentra, Vitamin K Reversed INR = 1.4- > 1.24  Renal: SCr 0.98  Heme/Onc: H&H 7.8/24.8, Plt 184  Goal of Therapy:  INR 2-3 Monitor platelets by anticoagulation protocol: Yes   Plan:  Warfarin 5 mg x 1  Daily INR  Levester Fresh, PharmD, BCPS, BCCCP Clinical Pharmacist [phone]  Please check AMION for all [place] numbers  11/05/2018 9:34 AM

## 2018-11-05 NOTE — Progress Notes (Addendum)
Pt transferred to unit from 47M via wheelchair w/ staff. Pt in stable condition and oriented to room. Telemetry transferred to box 29. IV flushed and saline locked. Bed in low position, call light within reach, and pt instructed to call for help when needed. Will continue to monitor.

## 2018-11-05 NOTE — Progress Notes (Signed)
PROGRESS NOTE    Leonard Bentley  WSF:[id number] DOB: September 29, 1961 DOA: 11/01/2018 PCP: [name], NP   Brief Narrative: Patient is a 57 year old male with past medical history of pulmonary embolism on Coumadin, non-Hodgkin's lymphoma, mitral valve regurgitation status post repair, tobacco and alcohol abuse, depression, anxiety, hypertension who presented to the emergency room with complaints of pain and ecchymosis on his right medial thigh.  He was found to have supratherapeutic INR on presentation.  His INR was more than 10 .  Reversed with Kcentra and vitamin K.  He developed  delirium tremens and remained agitated despite receiving several doses of Ativan.  He was moved to ICU for Precedex drip.TRH resuming care on 11/05/18.  Assessment & Plan:   Principal Problem:   Supratherapeutic INR Active Problems:   Anemia   Cigarette smoker   History of pulmonary embolus (PE)   S/P minimally-invasive mitral valve repair   Alcohol withdrawal (HCC)   Tachyarrhythmia   Hypomagnesemia   HTN (hypertension)   UTI (urinary tract infection)  Delirium tremens: History of chronic alcohol abuse.  Remained agitated despite several dose of Ativan.  Moved to ICU for Precedex.  Currently his mental status stable.  Alert and oriented. He was started on oral Librium.  Continue to taper and stop. Continue to monitor CIWA.  Supratherapeutic INR:Has large area of ecchymosis in the right medial thigh suggestive of hematoma.  He has this for last 1 month.  INR reversed with Kcentra and vitamin K IV Also was found to have hematuria. Patient on Coumadin at home for history of PE.  Looks like he is not compliant with his medications, and might have taken additional doses accidentally which he does not remember. He is has history of alcohol abuse. Will restart Coumadin today.  Acute kidney injury: Resolved  History of pulmonary embolism: On Coumadin at home.  Continue on discharge.  Chronic alcohol abuse:  Drinks half a bottle of wine every [place].  Continue thiamine and folic acid.Counselled for cessation.  Suspected urinary tract infection: Denied any dysuria, but patient is a poor historian .Urinalysis suggestive of  urinary tract infection.  Started on Rocephin.  Culture grew mixed organisms.  Antibiotics stopped.  Normocytic Anemia:  Could be associated  with left thigh hematoma.Also received lots of fluids ,so might be dilutional.  Will check iron panel.  Tobacco abuse: Continue nicotine patch  Mitral regurgitation: Status post minimally invasive mitral valve repair.  Sinus tachycardia: Secondary to alcohol withdrawal.  On metoprolol.  Hypomagnesemia: Supplemented  Hypertension: Currently blood pressure is soft.  Continue to monitor.  Debility/deconditioning: Requested for PT evaluation          DVT prophylaxis:Warfarin Code Status: Full Family Communication: None present at the bedside Disposition Plan: Likely home in 1-2 days after complete resolution of alcohol withdrawal syndrome.  Needs PT evaluation.  Procedures:None  Antimicrobials: None  Subjective: Patient seen and examined the bedside this morning.mental status has improved.  Currently alert and oriented.  Still has generalized weakness.  In sinus tachycardia.  Denies any chest pain or shortness of breath. Objective: Vitals:   11/05/18 0023 11/05/18 0410 11/05/18 0700 11/05/18 0800  BP:    108/88  Pulse:    (!) 103  Resp:    (!) 33  Temp: 98.3 F (36.8 C) 98.1 F (36.7 C) 98.2 F (36.8 C)   TempSrc: Oral Oral Oral   SpO2:    98%  Weight:      Height:  Intake/Output Summary (Last 24 hours) at 11/05/2018 0907 Last data filed at 11/05/2018 0407 Gross per 24 hour  Intake 308.79 ml  Output 125 ml  Net 183.79 ml   Filed Weights   11/01/18 1809 11/02/18 0338 11/04/18 0603  Weight: 86.2 kg 86.1 kg 87.7 kg    Examination:  General exam:Not in distress,calmer HEENT:PERRL,Oral mucosa moist,  Ear/Nose normal on gross exam Respiratory system: Bilateral equal air entry, normal vesicular breath sounds, no wheezes or crackles  Cardiovascular system: Sinus tachycardia, RRR. No JVD, murmurs, rubs, gallops or clicks. No pedal edema. Gastrointestinal system: Abdomen is nondistended, soft and nontender. No organomegaly or masses felt. Normal bowel sounds heard. Central nervous system: Alert and oriented. No focal neurological deficits. Extremities: No edema, no clubbing ,no cyanosis, distal peripheral pulses palpable. Ecchymosis on the left medial thigh Skin: No ulcers,no icterus ,no pallor   Data Reviewed: I have personally reviewed following labs and imaging studies  CBC: Recent Labs  Lab 11/01/18 1917 11/02/18 0152 11/03/18 0310 11/03/18 1650 11/04/18 0241 11/05/18 0433  WBC 3.9* 3.5* 5.3  --  4.6 3.9*  NEUTROABS 3.1  --  3.8  --   --   --   HGB 10.7* 9.1* 7.8* 7.3* 8.2* 7.8*  HCT 33.2* 27.9* 24.6* 22.3* 25.4* 24.8*  MCV 79.4* 79.0* 80.7  --  82.2 82.4  PLT 167 141* 144*  --  170 154   Basic Metabolic Panel: Recent Labs  Lab 11/01/18 1917 11/02/18 0152 11/04/18 0241 11/05/18 0433  NA 138 136 139 139  K 4.6 4.7 3.8 3.5  CL 100 103 104 105  CO2 23 24 26 25   GLUCOSE 109* 96 123* 96  BUN 9 8 5* 8  CREATININE 0.92 0.89 0.81 0.98  CALCIUM 9.2 8.4* 8.6* 8.5*  MG 1.6*  --  1.7 1.8  PHOS  --   --  3.4 3.4   GFR: Estimated Creatinine Clearance: 90.9 mL/min (by C-G formula based on SCr of 0.98 mg/dL). Liver Function Tests: Recent Labs  Lab 11/04/18 0241  AST 129*  ALT 104*  ALKPHOS 76  BILITOT 1.5*  PROT 5.9*  ALBUMIN 3.1*   No results for input(s): LIPASE, AMYLASE in the last 168 hours. No results for input(s): AMMONIA in the last 168 hours. Coagulation Profile: Recent Labs  Lab 11/01/18 2349 11/02/18 0539 11/02/18 1332 11/03/18 0310 11/04/18 0241  INR >10.00* >10.00* 1.40 1.24 1.23   Cardiac Enzymes: No results for input(s): CKTOTAL, CKMB,  CKMBINDEX, TROPONINI in the last 168 hours. BNP (last 3 results) No results for input(s): PROBNP in the last 8760 hours. HbA1C: No results for input(s): HGBA1C in the last 72 hours. CBG: Recent Labs  Lab 11/03/18 2025 11/04/18 0342  GLUCAP 148* 109*   Lipid Profile: No results for input(s): CHOL, HDL, LDLCALC, TRIG, CHOLHDL, LDLDIRECT in the last 72 hours. Thyroid Function Tests: No results for input(s): TSH, T4TOTAL, FREET4, T3FREE, THYROIDAB in the last 72 hours. Anemia Panel: No results for input(s): VITAMINB12, FOLATE, FERRITIN, TIBC, IRON, RETICCTPCT in the last 72 hours. Sepsis Labs: Recent Labs  Lab 11/02/18 0152 11/02/18 0424 11/03/18 0310  PROCALCITON <0.10  --   --   LATICACIDVEN 1.5 2.2* 3.1*    Recent Results (from the past 240 hour(s))  Urine culture     Status: Abnormal   Collection Time: 11/01/18 10:48 PM  Result Value Ref Range Status   Specimen Description URINE, RANDOM  Final   Special Requests   Final    NONE  Performed at [facility], [place] [address] [place], [place] [address]    Culture MULTIPLE SPECIES PRESENT, SUGGEST RECOLLECTION (A)  Final   Report Status 11/03/2018 FINAL  Final  Culture, blood (x 2)     Status: None (Preliminary result)   Collection Time: 11/02/18  1:50 AM  Result Value Ref Range Status   Specimen Description BLOOD RIGHT HAND  Final   Special Requests   Final    BOTTLES DRAWN AEROBIC AND ANAEROBIC Blood Culture results may not be optimal due to an excessive volume of blood received in culture bottles   Culture   Final    NO GROWTH 2 DAYS Performed at [facility], [place] [address], [place], [place] [address]    Report Status PENDING  Incomplete  Culture, blood (x 2)     Status: None (Preliminary result)   Collection Time: 11/02/18  2:10 AM  Result Value Ref Range Status   Specimen Description BLOOD RIGHT HAND  Final   Special Requests   Final    BOTTLES DRAWN AEROBIC AND ANAEROBIC Blood Culture adequate volume    Culture   Final    NO GROWTH 2 DAYS Performed at [facility], [place] [address] [place], [place] [address]    Report Status PENDING  Incomplete  MRSA PCR Screening     Status: None   Collection Time: 11/03/18  8:50 PM  Result Value Ref Range Status   MRSA by PCR NEGATIVE NEGATIVE Final    Comment:        The GeneXpert MRSA Assay (FDA approved for NASAL specimens only), is one component of a comprehensive MRSA colonization surveillance program. It is not intended to diagnose MRSA infection nor to guide or monitor treatment for MRSA infections. Performed at [facility], [place] [address] [place], [name] [address]          Radiology Studies: No results found.      Scheduled Meds: . chlordiazePOXIDE  5 mg Oral [id number]  . folic acid  1 mg Oral Daily  . metoprolol tartrate  12.5 mg Oral BID  . multivitamin with minerals  1 tablet Oral Daily  . nicotine  21 mg Transdermal Daily  . thiamine  100 mg Oral Daily   Continuous Infusions:    LOS: 3 days    Time spent:35 mins. More than 50% of that time was spent in counseling and/or coordination of care.      Shelly Coss, MD [facility] Pager [phone]  If 7PM-7AM, please contact night-coverage [email or web address] Password [email or web address] 11/05/2018, 9:07 AM

## 2018-11-05 NOTE — Progress Notes (Signed)
I agree with previous RN's assessment. 

## 2018-11-06 LAB — IRON AND TIBC
Iron: 85 ug/dL (ref 45–182)
Saturation Ratios: 31 % (ref 17.9–39.5)
TIBC: 279 ug/dL (ref 250–450)
UIBC: 194 ug/dL

## 2018-11-06 LAB — CBC WITH DIFFERENTIAL/PLATELET
Abs Immature Granulocytes: 0.02 10*3/uL (ref 0.00–0.07)
Basophils Absolute: 0 10*3/uL (ref 0.0–0.1)
Basophils Relative: 1 %
Eosinophils Absolute: 0.1 10*3/uL (ref 0.0–0.5)
Eosinophils Relative: 2 %
HCT: 25 % — ABNORMAL LOW (ref 39.0–52.0)
Hemoglobin: 7.8 g/dL — ABNORMAL LOW (ref 13.0–17.0)
Immature Granulocytes: 1 %
Lymphocytes Relative: 12 %
Lymphs Abs: 0.5 10*3/uL — ABNORMAL LOW (ref 0.7–4.0)
MCH: 25.7 pg — ABNORMAL LOW (ref 26.0–34.0)
MCHC: 31.2 g/dL (ref 30.0–36.0)
MCV: 82.5 fL (ref 80.0–100.0)
Monocytes Absolute: 0.9 10*3/uL (ref 0.1–1.0)
Monocytes Relative: 23 %
Neutro Abs: 2.3 10*3/uL (ref 1.7–7.7)
Neutrophils Relative %: 61 %
Platelets: 200 10*3/uL (ref 150–400)
RBC: 3.03 MIL/uL — ABNORMAL LOW (ref 4.22–5.81)
RDW: 18.4 % — ABNORMAL HIGH (ref 11.5–15.5)
WBC: 3.7 10*3/uL — ABNORMAL LOW (ref 4.0–10.5)
nRBC: 9.2 % — ABNORMAL HIGH (ref 0.0–0.2)

## 2018-11-06 LAB — FERRITIN: Ferritin: 705 ng/mL — ABNORMAL HIGH (ref 24–336)

## 2018-11-06 LAB — PROTIME-INR
INR: 1.25
Prothrombin Time: 15.6 seconds — ABNORMAL HIGH (ref 11.4–15.2)

## 2018-11-06 LAB — LACTIC ACID, PLASMA: Lactic Acid, Venous: 1.2 mmol/L (ref 0.5–1.9)

## 2018-11-06 LAB — GLUCOSE, CAPILLARY: Glucose-Capillary: 125 mg/dL — ABNORMAL HIGH (ref 70–99)

## 2018-11-06 MED ORDER — LORAZEPAM 2 MG/ML IJ SOLN: 1.0000 mg | Freq: Four times a day (QID) | INTRAMUSCULAR | Status: DC | PRN

## 2018-11-06 MED ORDER — WARFARIN SODIUM 5 MG PO TABS
5.0000 mg | ORAL_TABLET | Freq: Once | ORAL | Status: AC
  Administered 2018-11-06: 5 mg via ORAL
  Filled 2018-11-06: qty 1

## 2018-11-06 MED ORDER — ENOXAPARIN SODIUM 40 MG/0.4ML ~~LOC~~ SOLN
40.0000 mg | SUBCUTANEOUS | Status: DC
  Administered 2018-11-06 – 2018-11-08 (×3): 40 mg via SUBCUTANEOUS
  Filled 2018-11-06 (×3): qty 0.4

## 2018-11-06 MED ORDER — LORAZEPAM 1 MG PO TABS
1.0000 mg | ORAL_TABLET | Freq: Four times a day (QID) | ORAL | Status: DC | PRN
  Administered 2018-11-06 – 2018-11-08 (×3): 1 mg via ORAL
  Filled 2018-11-06 (×4): qty 1

## 2018-11-06 NOTE — Progress Notes (Signed)
ANTICOAGULATION CONSULT NOTE - Initial Consult  Pharmacy Consult for warfarin Indication: pulmonary embolus and MVR  Allergies  Allergen Reactions  . Other Other (See Comments)    "Cactus" blisters on tongue    Patient Measurements: Height: 5' 9.5" (176.5 cm) Weight: 193 lb 5.5 oz (87.7 kg) IBW/kg (Calculated) : 71.85  Vital Signs: Temp: 98.5 F (36.9 C) (12/30 0517) Temp Source: Oral (12/30 0517) BP: 114/81 (12/30 0903) Pulse Rate: 87 (12/30 0903)  Labs: Recent Labs    11/04/18 0241 11/05/18 0433 11/06/18 0450  HGB 8.2* 7.8* 7.8*  HCT 25.4* 24.8* 25.0*  PLT 170 184 200  LABPROT 15.4* 16.6* 15.6*  INR 1.23 1.35 1.25  CREATININE 0.81 0.98  --     Estimated Creatinine Clearance: 90.9 mL/min (by C-G formula based on SCr of 0.98 mg/dL).  Assessment: CC/HPI: 57 yo m presenting initially with hematoma to right thigh, then developed tremors from etoh withdrawal  PMH: PE MVR, NHL, Depression, HTN  Anticoag:   Coumadin PTA for PE history (2014/2017), placed on Warfarin after MVR repair (no issues with Xarelto)  -12/26: INR > 10 on admit and repeat INR > 10, s/p Vit K 5 mg po, Kcentra, Vitamin K Reversed INR = 1.25  Renal: SCr 0.98  Heme/Onc: H&H 7.8/25, Plt 200  Goal of Therapy:  INR 2-3 Monitor platelets by anticoagulation protocol: Yes   Plan:  Warfarin 5 mg x 1  Daily INR  Onnie Boer, PharmD, BCIDP, AAHIVP, CPP Infectious Disease Pharmacist 11/06/2018 10:58 AM

## 2018-11-06 NOTE — Evaluation (Signed)
Physical Therapy Evaluation Patient Details Name: Leonard Bentley MRN: [id number] DOB: 28-Jan-1961 Today's Date: 11/06/2018   History of Present Illness  57yo male with pain and ecchymosis R medial thigh, found to have INR >10, tachycardic in ED. Admitted for supratherapeutic INR, UTI, alcohol withdrawal. PMH hx PE, tachycardia, HTN, non Hodgkins lymphoma, mitral valve repair, cardiac cath   Clinical Impression   Patient received in bed, A&Ox4 and willing to participate in PT, states "I usually run 3 miles a [place] so I want to work out!". Able to complete bed mobility with S and extended time, functional transfers with MinA fading to Min guard, and gait approximately 168f with RW and min guard, Mod cues for safety and sequencing. HR to 150BPM following gait, recovered to 90-100BPM with seated rest following activity. Cues provided throughout session for safety as patient is impulsive at times, poor awareness of safety. He was left up in the recliner with all needs met, chair alarm active and educated not to get up without staff assistance. He will continue to benefit from skilled PT services in the acute setting, recommend HHPT and 24/7 S moving forward; if he does not have access to 24/7 S, recommend ST-SNF due to poor safety awareness.     Follow Up Recommendations Home health PT;Supervision/Assistance - 24 hour;Other (comment)(if does not have 24/7 S, may need ST-SNF )    Equipment Recommendations  Rolling walker with 5" wheels;3in1 (PT)    Recommendations for Other Services       Precautions / Restrictions Precautions Precautions: Fall;Other (comment) Precaution Comments: watch HR  Restrictions Weight Bearing Restrictions: No      Mobility  Bed Mobility Overal bed mobility: Needs Assistance Bed Mobility: Supine to Sit     Supine to sit: Supervision     General bed mobility comments: S for safety   Transfers Overall transfer level: Needs assistance Equipment used: Rolling  walker (2 wheeled);None Transfers: Sit to/from Stand Sit to Stand: Min guard;Min assist         General transfer comment: initially MinA fading to Min guard with repeated sit to stands at edge of bed and from chair; able to perform with and without RW   Ambulation/Gait Ambulation/Gait assistance: Min guard Gait Distance (Feet): 150 Feet Assistive device: Rolling walker (2 wheeled) Gait Pattern/deviations: Step-through pattern;Decreased step length - right;Decreased step length - left;Decreased stride length;Decreased dorsiflexion - right;Decreased dorsiflexion - left;Drifts right/left;Trunk flexed;Wide base of support Gait velocity: decreased    General Gait Details: wide BOS, some tremors noted possibly from DTs, very motivated to walk in hallway and keeps requesting to go further. Cues to maintain safe distance from RW and for obstacle navigation in hallway   Stairs            Wheelchair Mobility    Modified Rankin (Stroke Patients Only)       Balance Overall balance assessment: Needs assistance Sitting-balance support: Bilateral upper extremity supported;Feet supported Sitting balance-Leahy Scale: Good Sitting balance - Comments: SBA for safety    Standing balance support: During functional activity;No upper extremity supported Standing balance-Leahy Scale: Fair Standing balance comment: able to pull underwear up above hips with no UE support and close min guard for balance                              Pertinent Vitals/Pain Pain Assessment: No/denies pain    Home Living Family/patient expects to be discharged to:: Private residence Living  Arrangements: Alone Available Help at Discharge: Family;Available PRN/intermittently Type of Home: House Home Access: Stairs to enter Entrance Stairs-Rails: Right Entrance Stairs-Number of Steps: 3 Home Layout: One level Home Equipment: None      Prior Function Level of Independence: Independent          Comments: Sometimes takes increased time for tasks due to tremors in hands (worse sometimes over others), drives, runs 3 miles daily      Hand Dominance   Dominant Hand: Right    Extremity/Trunk Assessment   Upper Extremity Assessment Upper Extremity Assessment: Defer to OT evaluation    Lower Extremity Assessment Lower Extremity Assessment: Overall WFL for tasks assessed    Cervical / Trunk Assessment Cervical / Trunk Assessment: Normal  Communication   Communication: No difficulties  Cognition Arousal/Alertness: Awake/alert Behavior During Therapy: Impulsive;Flat affect Overall Cognitive Status: No family/caregiver present to determine baseline cognitive functioning                                 General Comments: A&Ox4 but poor safety awareness, requires cues to reduce impulsivity and for safety/sequencing       General Comments General comments (skin integrity, edema, etc.): HR WNL at rest in bed, up to 150BPM with gait, recovered to 90-100BPM seated in chair     Exercises     Assessment/Plan    PT Assessment Patient needs continued PT services  PT Problem List Decreased balance;Decreased mobility;Decreased knowledge of use of DME;Decreased activity tolerance;Decreased coordination;Decreased safety awareness       PT Treatment Interventions DME instruction;Functional mobility training;Balance training;Patient/family education;Gait training;Therapeutic activities;Neuromuscular re-education;Stair training;Therapeutic exercise;Cognitive remediation    PT Goals (Current goals can be found in the Care Plan section)  Acute Rehab PT Goals Patient Stated Goal: to go home, start running again  PT Goal Formulation: With patient Time For Goal Achievement: 11/20/18 Potential to Achieve Goals: Good    Frequency Min 3X/week   Barriers to discharge Decreased caregiver support right now would recommend 24/7 S, unsure if family can provide this      Co-evaluation               AM-PAC PT "6 Clicks" Mobility  Outcome Measure Help needed turning from your back to your side while in a flat bed without using bedrails?: None Help needed moving from lying on your back to sitting on the side of a flat bed without using bedrails?: A Little Help needed moving to and from a bed to a chair (including a wheelchair)?: A Little Help needed standing up from a chair using your arms (e.g., wheelchair or bedside chair)?: A Little Help needed to walk in hospital room?: A Lot Help needed climbing 3-5 steps with a railing? : A Lot 6 Click Score: 17    End of Session Equipment Utilized During Treatment: Gait belt Activity Tolerance: Patient tolerated treatment well Patient left: in chair;with call bell/phone within reach;with chair alarm set   PT Visit Diagnosis: Unsteadiness on feet (R26.81);Muscle weakness (generalized) (M62.81);Difficulty in walking, not elsewhere classified (R26.2)    Time: 4818-5631 PT Time Calculation (min) (ACUTE ONLY): 29 min   Charges:   PT Evaluation $PT Eval Moderate Complexity: 1 Mod PT Treatments $Gait Training: 8-22 mins        Deniece Ree PT, DPT, CBIS  Supplemental Physical Therapist [place]    Pager [phone] Acute Rehab Office [phone]

## 2018-11-06 NOTE — Telephone Encounter (Signed)
Patient no showed appt he made.

## 2018-11-06 NOTE — Progress Notes (Signed)
PROGRESS NOTE    Leonard Bentley  ZOX:[id number] DOB: [birth year] DOA: 11/01/2018 PCP: [name], NP   Brief Narrative: Patient is a 57 year old male with past medical history of pulmonary embolism on Coumadin, non-Hodgkin's lymphoma, mitral valve regurgitation status post repair, tobacco and alcohol abuse, depression, anxiety, hypertension who presented to the emergency room with complaints of pain and ecchymosis on his right medial thigh.  He was found to have supratherapeutic INR on presentation.  His INR was more than 10 .  Reversed with Kcentra and vitamin K.  He developed  delirium tremens and remained agitated despite receiving several doses of Ativan.  He was moved to ICU for Precedex drip.TRH resuming care on 11/05/18.  Assessment & Plan:   Principal Problem:   Supratherapeutic INR Active Problems:   Anemia   Cigarette smoker   History of pulmonary embolus (PE)   S/P minimally-invasive mitral valve repair   Alcohol withdrawal (HCC)   Tachyarrhythmia   Hypomagnesemia   HTN (hypertension)   UTI (urinary tract infection)  Delirium tremens: History of chronic alcohol abuse.  Remained agitated despite several dose of Ativan.  Moved to ICU for Precedex.  Currently his mental status stable.  Alert and oriented. He was started on oral Librium.  Continue to taper and stop. Continue to monitor CIWA.  Supratherapeutic INR Right thigh hematoma. Has large area of ecchymosis in the right medial thigh suggestive of hematoma.  He has this for last 1 month.   Patient's INR reversed with Kcentra and vitamin K IV (15 mg) Also was found to have hematuria. Patient on Coumadin at home for history of PE Definitely his alcohol abuse is primarily behind his supratherapeutic INR. INR is now subtherapeutic.  Monitor.  Acute kidney injury: Resolved  History of pulmonary embolism: On Coumadin at home.  Continue on discharge. Monitor for therapeutic INR prior to discharge given the patient  history of right thigh hematoma requiring reversal with K Centra  Chronic alcohol abuse: Drinks half a bottle of wine every Billig.  Continue thiamine and folic acid.Counselled for cessation.  Suspected urinary tract infection: Denied any dysuria, but patient is a poor historian .Urinalysis suggestive of  urinary tract infection.  Started on Rocephin.  Culture grew mixed organisms.  Antibiotics stopped.  Normocytic Anemia:  Could be associated  with left thigh hematoma.Also received lots of fluids ,so might be dilutional.   Tobacco abuse: Continue nicotine patch  Mitral regurgitation:  Status post minimally invasive mitral valve repair. Does not require anticoagulation for this.  Sinus tachycardia: Secondary to alcohol withdrawal.  On metoprolol.  Hypomagnesemia: Supplemented  Hypertension: Currently blood pressure is soft.  Continue to monitor.  Debility/deconditioning: Requested for PT evaluation   DVT prophylaxis:Warfarin Code Status: Full Family Communication: None present at the bedside Disposition Plan: Likely home in 1-2 days after complete resolution of alcohol withdrawal syndrome.  Needs PT evaluation.  Procedures:None  Antimicrobials: None  Subjective: Anxious.  No hallucination.  Unable to sleep last night.  No nausea no vomiting no fever no chills.  No pain in the thigh area.  Objective: Vitals:   11/06/18 0517 11/06/18 0542 11/06/18 0903 11/06/18 1423  BP: (!) 121/92  114/81 (!) 136/104  Pulse: (!) 124 84 87 93  Resp: 18     Temp: 98.5 F (36.9 C)   98 F (36.7 C)  TempSrc: Oral   Oral  SpO2: 99%     Weight:      Height:  No intake or output data in the 24 hours ending 11/06/18 1757 Filed Weights   11/01/18 1809 11/02/18 0338 11/04/18 0603  Weight: 86.2 kg 86.1 kg 87.7 kg    Examination:  General exam:Not in distress,calmer HEENT:PERRL,Oral mucosa moist, Ear/Nose normal on gross exam Respiratory system: Bilateral equal air entry, normal  vesicular breath sounds, no wheezes or crackles  Cardiovascular system: Sinus tachycardia, RRR. No JVD, murmurs, rubs, gallops or clicks. No pedal edema. Gastrointestinal system: Abdomen is nondistended, soft and nontender. No organomegaly or masses felt. Normal bowel sounds heard. Central nervous system: Alert and oriented. No focal neurological deficits. Extremities: No edema, no clubbing ,no cyanosis, distal peripheral pulses palpable. Ecchymosis on the left medial thigh, no hematoma identified. Skin: No ulcers,no icterus ,no pallor   Data Reviewed: I have personally reviewed following labs and imaging studies  CBC: Recent Labs  Lab 11/01/18 1917 11/02/18 0152 11/03/18 0310 11/03/18 1650 11/04/18 0241 11/05/18 0433 11/06/18 0450  WBC 3.9* 3.5* 5.3  --  4.6 3.9* 3.7*  NEUTROABS 3.1  --  3.8  --   --   --  2.3  HGB 10.7* 9.1* 7.8* 7.3* 8.2* 7.8* 7.8*  HCT 33.2* 27.9* 24.6* 22.3* 25.4* 24.8* 25.0*  MCV 79.4* 79.0* 80.7  --  82.2 82.4 82.5  PLT 167 141* 144*  --  170 184 086   Basic Metabolic Panel: Recent Labs  Lab 11/01/18 1917 11/02/18 0152 11/04/18 0241 11/05/18 0433  NA 138 136 139 139  K 4.6 4.7 3.8 3.5  CL 100 103 104 105  CO2 23 24 26 25   GLUCOSE 109* 96 123* 96  BUN 9 8 5* 8  CREATININE 0.92 0.89 0.81 0.98  CALCIUM 9.2 8.4* 8.6* 8.5*  MG 1.6*  --  1.7 1.8  PHOS  --   --  3.4 3.4   GFR: Estimated Creatinine Clearance: 90.9 mL/min (by C-G formula based on SCr of 0.98 mg/dL). Liver Function Tests: Recent Labs  Lab 11/04/18 0241  AST 129*  ALT 104*  ALKPHOS 76  BILITOT 1.5*  PROT 5.9*  ALBUMIN 3.1*   No results for input(s): LIPASE, AMYLASE in the last 168 hours. No results for input(s): AMMONIA in the last 168 hours. Coagulation Profile: Recent Labs  Lab 11/02/18 1332 11/03/18 0310 11/04/18 0241 11/05/18 0433 11/06/18 0450  INR 1.40 1.24 1.23 1.35 1.25   Cardiac Enzymes: No results for input(s): CKTOTAL, CKMB, CKMBINDEX, TROPONINI in the  last 168 hours. BNP (last 3 results) No results for input(s): PROBNP in the last 8760 hours. HbA1C: No results for input(s): HGBA1C in the last 72 hours. CBG: Recent Labs  Lab 11/03/18 2025 11/04/18 0342 11/06/18 1229  GLUCAP 148* 109* 125*   Lipid Profile: No results for input(s): CHOL, HDL, LDLCALC, TRIG, CHOLHDL, LDLDIRECT in the last 72 hours. Thyroid Function Tests: No results for input(s): TSH, T4TOTAL, FREET4, T3FREE, THYROIDAB in the last 72 hours. Anemia Panel: Recent Labs    11/06/18 0450  FERRITIN 705*  TIBC 279  IRON 85   Sepsis Labs: Recent Labs  Lab 11/02/18 0152 11/02/18 0424 11/03/18 0310 11/06/18 0450  PROCALCITON <0.10  --   --   --   LATICACIDVEN 1.5 2.2* 3.1* 1.2    Recent Results (from the past 240 hour(s))  Urine culture     Status: Abnormal   Collection Time: 11/01/18 10:48 PM  Result Value Ref Range Status   Specimen Description URINE, RANDOM  Final   Special Requests   Final  NONE Performed at [facility], [place] [address], [place], [place] [address]    Culture MULTIPLE SPECIES PRESENT, SUGGEST RECOLLECTION (A)  Final   Report Status 11/03/2018 FINAL  Final  Culture, blood (x 2)     Status: None (Preliminary result)   Collection Time: 11/02/18  1:50 AM  Result Value Ref Range Status   Specimen Description BLOOD RIGHT HAND  Final   Special Requests   Final    BOTTLES DRAWN AEROBIC AND ANAEROBIC Blood Culture results may not be optimal due to an excessive volume of blood received in culture bottles   Culture   Final    NO GROWTH 4 DAYS Performed at [facility], [place] [address] [place], [place] [address]    Report Status PENDING  Incomplete  Culture, blood (x 2)     Status: None (Preliminary result)   Collection Time: 11/02/18  2:10 AM  Result Value Ref Range Status   Specimen Description BLOOD RIGHT HAND  Final   Special Requests   Final    BOTTLES DRAWN AEROBIC AND ANAEROBIC Blood Culture adequate volume   Culture    Final    NO GROWTH 4 DAYS Performed at [facility], [place] [address] [place], [place] [address]    Report Status PENDING  Incomplete  MRSA PCR Screening     Status: None   Collection Time: 11/03/18  8:50 PM  Result Value Ref Range Status   MRSA by PCR NEGATIVE NEGATIVE Final    Comment:        The GeneXpert MRSA Assay (FDA approved for NASAL specimens only), is one component of a comprehensive MRSA colonization surveillance program. It is not intended to diagnose MRSA infection nor to guide or monitor treatment for MRSA infections. Performed at [facility], [place] [address], [place], [place] [address]          Radiology Studies: No results found.      Scheduled Meds: . chlordiazePOXIDE  5 mg Oral q12n4p  . enoxaparin (LOVENOX) injection  40 mg Subcutaneous Q24H  . folic acid  1 mg Oral Daily  . metoprolol tartrate  12.5 mg Oral BID  . multivitamin with minerals  1 tablet Oral Daily  . nicotine  21 mg Transdermal Daily  . thiamine  100 mg Oral Daily  . Warfarin - Pharmacist Dosing Inpatient   Does not apply q1800   Continuous Infusions:    LOS: 4 days    Time spent:35 mins. More than 50% of that time was spent in counseling and/or coordination of care.    Author:  Berle Mull, MD [facility] Pager: [phone] 11/06/2018 6:01 PM     If 7PM-7AM, please contact night-coverage [email or web address] Password [facility] 11/06/2018, 5:57 PM

## 2018-11-07 LAB — COMPREHENSIVE METABOLIC PANEL
ALT: 84 U/L — ABNORMAL HIGH (ref 0–44)
AST: 90 U/L — ABNORMAL HIGH (ref 15–41)
Albumin: 3.3 g/dL — ABNORMAL LOW (ref 3.5–5.0)
Alkaline Phosphatase: 72 U/L (ref 38–126)
Anion gap: 9 (ref 5–15)
BUN: 7 mg/dL (ref 6–20)
CO2: 24 mmol/L (ref 22–32)
Calcium: 9.1 mg/dL (ref 8.9–10.3)
Chloride: 105 mmol/L (ref 98–111)
Creatinine, Ser: 0.78 mg/dL (ref 0.61–1.24)
GFR calc Af Amer: >60 mL/min (ref >60)
GFR calc non Af Amer: >60 mL/min (ref >60)
Glucose, Bld: 109 mg/dL — ABNORMAL HIGH (ref 70–99)
Potassium: 3.8 mmol/L (ref 3.5–5.1)
Sodium: 138 mmol/L (ref 135–145)
Total Bilirubin: 1.7 mg/dL — ABNORMAL HIGH (ref 0.3–1.2)
Total Protein: 6.4 g/dL — ABNORMAL LOW (ref 6.5–8.1)

## 2018-11-07 LAB — CBC WITH DIFFERENTIAL/PLATELET
Abs Immature Granulocytes: 0.03 10*3/uL (ref 0.00–0.07)
Basophils Absolute: 0 10*3/uL (ref 0.0–0.1)
Basophils Relative: 1 %
Eosinophils Absolute: 0.1 10*3/uL (ref 0.0–0.5)
Eosinophils Relative: 3 %
HCT: 27.5 % — ABNORMAL LOW (ref 39.0–52.0)
Hemoglobin: 8.4 g/dL — ABNORMAL LOW (ref 13.0–17.0)
Immature Granulocytes: 1 %
Lymphocytes Relative: 14 %
Lymphs Abs: 0.6 10*3/uL — ABNORMAL LOW (ref 0.7–4.0)
MCH: 25.4 pg — ABNORMAL LOW (ref 26.0–34.0)
MCHC: 30.5 g/dL (ref 30.0–36.0)
MCV: 83.1 fL (ref 80.0–100.0)
Monocytes Absolute: 0.9 10*3/uL (ref 0.1–1.0)
Monocytes Relative: 20 %
Neutro Abs: 2.7 10*3/uL (ref 1.7–7.7)
Neutrophils Relative %: 61 %
Platelets: 243 10*3/uL (ref 150–400)
RBC: 3.31 MIL/uL — ABNORMAL LOW (ref 4.22–5.81)
RDW: 18.7 % — ABNORMAL HIGH (ref 11.5–15.5)
WBC: 4.4 10*3/uL (ref 4.0–10.5)
nRBC: 11.5 % — ABNORMAL HIGH (ref 0.0–0.2)

## 2018-11-07 LAB — CULTURE, BLOOD (ROUTINE X 2)
Culture: NO GROWTH
Culture: NO GROWTH
Special Requests: ADEQUATE

## 2018-11-07 LAB — PROTIME-INR
INR: 1.25
Prothrombin Time: 15.6 seconds — ABNORMAL HIGH (ref 11.4–15.2)

## 2018-11-07 LAB — MAGNESIUM: Magnesium: 1.6 mg/dL — ABNORMAL LOW (ref 1.7–2.4)

## 2018-11-07 MED ORDER — MAGNESIUM SULFATE 2 GM/50ML IV SOLN
2.0000 g | Freq: Once | INTRAVENOUS | Status: AC
  Administered 2018-11-07: 2 g via INTRAVENOUS
  Filled 2018-11-07: qty 50

## 2018-11-07 MED ORDER — WARFARIN SODIUM 7.5 MG PO TABS
7.5000 mg | ORAL_TABLET | Freq: Once | ORAL | Status: AC
  Administered 2018-11-07: 7.5 mg via ORAL
  Filled 2018-11-07: qty 1

## 2018-11-07 NOTE — Progress Notes (Signed)
Physical Therapy Treatment Patient Details Name: Leonard Bentley MRN: [id number] DOB: 1961/08/20 Today's Date: 11/07/2018    History of Present Illness 57yo male with pain and ecchymosis R medial thigh, found to have INR >10, tachycardic in ED. Admitted for supratherapeutic INR, UTI, alcohol withdrawal. PMH hx PE, tachycardia, HTN, non Hodgkins lymphoma, mitral valve repair, cardiac cath     PT Comments    Progressing with mobility. Pt remains unsteady and at risk for falls. Discussed the need for 24 hour supervision/assist with pt. He stated he could arrange for some help at home. Recommend continued RW use for ambulation safety. If pt cannot arrange for assistance in home, he may need to d/c to a SNF until he is safe to be at home alone.    Follow Up Recommendations  Home health PT;Supervision/Assistance - 24 hour     Equipment Recommendations  Rolling walker with 5" wheels    Recommendations for Other Services       Precautions / Restrictions Precautions Precautions: Fall Precaution Comments: watch HR  Restrictions Weight Bearing Restrictions: No    Mobility  Bed Mobility   Bed Mobility: Supine to Sit     Supine to sit: Supervision     General bed mobility comments:  for safety   Transfers Overall transfer level: Needs assistance Equipment used: Rolling walker (2 wheeled);None Transfers: Sit to/from Stand Sit to Stand: Min assist         General transfer comment: Assist to steady. VCs safety. [place] with RW use.   Ambulation/Gait Ambulation/Gait assistance: Min assist;Min guard Gait Distance (Feet): 300 Feet Assistive device: Rolling walker (2 wheeled) Gait Pattern/deviations: Step-through pattern;Decreased stride length     General Gait Details: Intermittent assist to stabilize-mostly Min guard. Fair gait speed. Pt tolerated increased distance   Stairs             Wheelchair Mobility    Modified Rankin (Stroke Patients Only)        Balance Overall balance assessment: Needs assistance   Sitting balance-Leahy Scale: Good       Standing balance-Leahy Scale: Fair Standing balance comment: pt can stand statically unsupported but loses balance when trying to mobilize without RW                            Cognition Arousal/Alertness: Awake/alert Behavior During Therapy: WFL for tasks assessed/performed Overall Cognitive Status: Impaired/Different from baseline Area of Impairment: Safety/judgement                         Safety/Judgement: Decreased awareness of safety;Decreased awareness of deficits            Exercises General Exercises - Lower Extremity Hip ABduction/ADduction: AROM;Both;10 reps;Standing Hip Flexion/Marching: AROM;Both;10 reps;Standing Heel Raises: AROM;Both;10 reps;Standing Mini-Sqauts: 10 reps;Standing    General Comments        Pertinent Vitals/Pain Pain Assessment: No/denies pain    Home Living                      Prior Function            PT Goals (current goals can now be found in the care plan section) Progress towards PT goals: Progressing toward goals    Frequency    Min 3X/week      PT Plan Current plan remains appropriate    Co-evaluation  AM-PAC PT "6 Clicks" Mobility   Outcome Measure  Help needed turning from your back to your side while in a flat bed without using bedrails?: None Help needed moving from lying on your back to sitting on the side of a flat bed without using bedrails?: None Help needed moving to and from a bed to a chair (including a wheelchair)?: A Little Help needed standing up from a chair using your arms (e.g., wheelchair or bedside chair)?: A Little Help needed to walk in hospital room?: A Little Help needed climbing 3-5 steps with a railing? : A Lot 6 Click Score: 19    End of Session Equipment Utilized During Treatment: Gait belt Activity Tolerance: Patient tolerated treatment  well Patient left: in bed;with call bell/phone within reach;with bed alarm set   PT Visit Diagnosis: Unsteadiness on feet (R26.81);Muscle weakness (generalized) (M62.81);Difficulty in walking, not elsewhere classified (R26.2)     Time: 7619-5093 PT Time Calculation (min) (ACUTE ONLY): 25 min  Charges:  $Gait Training: 8-22 mins $Therapeutic Exercise: 8-22 mins                        Weston Anna, PT Acute Rehabilitation Services Pager: [phone] Office: [phone]

## 2018-11-07 NOTE — Progress Notes (Signed)
ANTICOAGULATION CONSULT NOTE - Initial Consult  Pharmacy Consult for warfarin Indication: pulmonary embolus and MVR repair  Allergies  Allergen Reactions  . Other Other (See Comments)    "Cactus" blisters on tongue    Patient Measurements: Height: 5' 9.5" (176.5 cm) Weight: 193 lb 5.5 oz (87.7 kg) IBW/kg (Calculated) : 71.85  Vital Signs: Temp: 97.9 F (36.6 C) (12/31 0550) Temp Source: Oral (12/31 0550) BP: 141/114 (12/31 0550) Pulse Rate: 83 (12/31 0550)  Labs: Recent Labs    11/05/18 0433 11/06/18 0450 11/07/18 0408  HGB 7.8* 7.8* 8.4*  HCT 24.8* 25.0* 27.5*  PLT 184 200 243  LABPROT 16.6* 15.6* 15.6*  INR 1.35 1.25 1.25  CREATININE 0.98  --  0.78    Estimated Creatinine Clearance: 111.3 mL/min (by C-G formula based on SCr of 0.78 mg/dL).  Assessment: CC/HPI: 57 yo m presenting initially with hematoma to right thigh, then developed tremors from etoh withdrawal  Anticoag:   Coumadin PTA for PE history (2014/2017), placed on Warfarin after MVR repair (no issues with Xarelto)  -12/26: INR > 10 on admit and repeat INR > 10, s/p Vit K 5 mg po, Kcentra, Vitamin K  INR 1.25 today. This probably from the effect of the large dose of vit k. We will give a slightly higher dose today.    Goal of Therapy:  INR 2-3 Monitor platelets by anticoagulation protocol: Yes   Plan:  Warfarin 7.5 mg x 1  Lovenox 40mg  SQ qday Daily INR  Onnie Boer, PharmD, BCIDP, AAHIVP, CPP Infectious Disease Pharmacist 11/07/2018 1:13 PM

## 2018-11-07 NOTE — Plan of Care (Signed)
  Problem: Education: Goal: Knowledge of General Education information will improve Description Including pain rating scale, medication(s)/side effects and non-pharmacologic comfort measures Outcome: Progressing Note:  POC reviewed with.

## 2018-11-07 NOTE — Progress Notes (Signed)
PROGRESS NOTE    Sai Zinn Coyne  TIW:[id number] DOB: Mar 24, 1961 DOA: 11/01/2018 PCP: [name], NP   Brief Narrative:   Patient is a 57 year old male with past medical history of pulmonary embolism on Coumadin, non-Hodgkin's lymphoma, mitral valve regurgitation status post repair, tobacco and alcohol abuse, depression, anxiety, hypertension who presented to the emergency room with complaints of pain and ecchymosis on his right medial thigh.  He was found to have supratherapeutic INR on presentation. His INR was more than 10 . Reversed with Kcentra and vitamin K. He developed  delirium tremens and remained agitated despite receiving several doses of Ativan.  He was moved to ICU for Precedex drip.TRH resuming care on 11/05/18.   Assessment & Plan:    Delirium tremens: History of chronic alcohol abuse.  Multiple admissions for DTs in the past, currently on CIWA protocol along with scheduled Librium, gradually improving.  Was counseled to quit alcohol.  Not motivated to seek any help at this time.  Supratherapeutic INR  Right thigh hematoma.  INR was earlier this admission with Kcentra and vitamin K, he is not a ideal candidate for long-term anticoagulation unfortunately he provides a history of recurrent PE.  He has been clearly told that if he does not quit alcohol and stop going into DTs he is at risk for hurting his head and killing himself due to bleed.  He understands it.  He accepts the risks and benefits and wants to continue Coumadin with caution.  Lab Results  Component Value Date   INR 1.25 11/07/2018   INR 1.25 11/06/2018   INR 1.35 11/05/2018     Acute kidney injury: Resolved  History of pulmonary embolism: see above.  Chronic alcohol abuse: Drinks half a bottle of wine every Krahn.  Continue thiamine and folic acid.Counselled for cessation.  Suspected urinary tract infection: Denied any dysuria, but patient is a poor historian .Urinalysis suggestive of  urinary tract  infection.  Started on Rocephin.  Culture grew mixed organisms.  Antibiotics stopped.  Normocytic Anemia:  Could be associated  with left thigh hematoma.Also received lots of fluids ,so might be dilutional.   Tobacco abuse: Continue nicotine patch  Mitral regurgitation:  Status post minimally invasive mitral valve repair.  Does not require anticoagulation for this.  Sinus tachycardia: Secondary to alcohol withdrawal.  On metoprolol.  Hypomagnesemia: Placed will recheck in the morning.  Hypertension: Able on low-dose beta-blocker.  Debility/deconditioning: Requested for PT evaluation     DVT prophylaxis:Warfarin Code Status: Full Family Communication: None present at the bedside Disposition Plan: Likely home in the morning if stable.  Procedures:None  Antimicrobials: None  Subjective: Anxious.  No hallucination.  Unable to sleep last night.  No nausea no vomiting no fever no chills.  No pain in the thigh area.  Objective: Vitals:   11/06/18 0903 11/06/18 1423 11/06/18 1857 11/07/18 0550  BP: 114/81 (!) 136/104 (!) 115/93 (!) 141/114  Pulse: 87 93 90 83  Resp:   16 18  Temp:  98 F (36.7 C) 97.8 F (36.6 C) 97.9 F (36.6 C)  TempSrc:  Oral Oral Oral  SpO2:   100% 100%  Weight:      Height:       No intake or output data in the 24 hours ending 11/07/18 1143 Filed Weights   11/01/18 1809 11/02/18 0338 11/04/18 0603  Weight: 86.2 kg 86.1 kg 87.7 kg    Examination:  Awake mildly confused, No new F.N deficits, Normal affect [place].AT,PERRAL Supple  Neck,No JVD, No cervical lymphadenopathy appriciated.  Symmetrical Chest wall movement, Good air movement bilaterally, CTAB RRR,No Gallops, Rubs or new Murmurs, No Parasternal Heave +ve B.Sounds, Abd Soft, No tenderness, No organomegaly appriciated, No rebound - guarding or rigidity. No Cyanosis, Clubbing or edema, Large R thigh bruise    Data Reviewed: I have personally reviewed following labs and imaging  studies  CBC: Recent Labs  Lab 11/01/18 1917  11/03/18 0310 11/03/18 1650 11/04/18 0241 11/05/18 0433 11/06/18 0450 11/07/18 0408  WBC 3.9*   < > 5.3  --  4.6 3.9* 3.7* 4.4  NEUTROABS 3.1  --  3.8  --   --   --  2.3 2.7  HGB 10.7*   < > 7.8* 7.3* 8.2* 7.8* 7.8* 8.4*  HCT 33.2*   < > 24.6* 22.3* 25.4* 24.8* 25.0* 27.5*  MCV 79.4*   < > 80.7  --  82.2 82.4 82.5 83.1  PLT 167   < > 144*  --  170 184 200 243   < > = values in this interval not displayed.   Basic Metabolic Panel: Recent Labs  Lab 11/01/18 1917 11/02/18 0152 11/04/18 0241 11/05/18 0433 11/07/18 0408  NA 138 136 139 139 138  K 4.6 4.7 3.8 3.5 3.8  CL 100 103 104 105 105  CO2 23 24 26 25 24   GLUCOSE 109* 96 123* 96 109*  BUN 9 8 5* 8 7  CREATININE 0.92 0.89 0.81 0.98 0.78  CALCIUM 9.2 8.4* 8.6* 8.5* 9.1  MG 1.6*  --  1.7 1.8 1.6*  PHOS  --   --  3.4 3.4  --    GFR: Estimated Creatinine Clearance: 111.3 mL/min (by C-G formula based on SCr of 0.78 mg/dL). Liver Function Tests: Recent Labs  Lab 11/04/18 0241 11/07/18 0408  AST 129* 90*  ALT 104* 84*  ALKPHOS 76 72  BILITOT 1.5* 1.7*  PROT 5.9* 6.4*  ALBUMIN 3.1* 3.3*   No results for input(s): LIPASE, AMYLASE in the last 168 hours. No results for input(s): AMMONIA in the last 168 hours. Coagulation Profile: Recent Labs  Lab 11/03/18 0310 11/04/18 0241 11/05/18 0433 11/06/18 0450 11/07/18 0408  INR 1.24 1.23 1.35 1.25 1.25   Cardiac Enzymes: No results for input(s): CKTOTAL, CKMB, CKMBINDEX, TROPONINI in the last 168 hours. BNP (last 3 results) No results for input(s): PROBNP in the last 8760 hours. HbA1C: No results for input(s): HGBA1C in the last 72 hours. CBG: Recent Labs  Lab 11/03/18 2025 11/04/18 0342 11/06/18 1229  GLUCAP 148* 109* 125*   Lipid Profile: No results for input(s): CHOL, HDL, LDLCALC, TRIG, CHOLHDL, LDLDIRECT in the last 72 hours. Thyroid Function Tests: No results for input(s): TSH, T4TOTAL, FREET4, T3FREE,  THYROIDAB in the last 72 hours. Anemia Panel: Recent Labs    11/06/18 0450  FERRITIN 705*  TIBC 279  IRON 85   Sepsis Labs: Recent Labs  Lab 11/02/18 0152 11/02/18 0424 11/03/18 0310 11/06/18 0450  PROCALCITON <0.10  --   --   --   LATICACIDVEN 1.5 2.2* 3.1* 1.2    Recent Results (from the past 240 hour(s))  Urine culture     Status: Abnormal   Collection Time: 11/01/18 10:48 PM  Result Value Ref Range Status   Specimen Description URINE, RANDOM  Final   Special Requests   Final    NONE Performed at [facility], [place] [address], [place], [name] [address]    Culture MULTIPLE SPECIES PRESENT, SUGGEST RECOLLECTION (  A)  Final   Report Status 11/03/2018 FINAL  Final  Culture, blood (x 2)     Status: None   Collection Time: 11/02/18  1:50 AM  Result Value Ref Range Status   Specimen Description BLOOD RIGHT HAND  Final   Special Requests   Final    BOTTLES DRAWN AEROBIC AND ANAEROBIC Blood Culture results may not be optimal due to an excessive volume of blood received in culture bottles   Culture   Final    NO GROWTH 5 DAYS Performed at [facility], [place] [address], [place], [place] [address]    Report Status 11/07/2018 FINAL  Final  Culture, blood (x 2)     Status: None   Collection Time: 11/02/18  2:10 AM  Result Value Ref Range Status   Specimen Description BLOOD RIGHT HAND  Final   Special Requests   Final    BOTTLES DRAWN AEROBIC AND ANAEROBIC Blood Culture adequate volume   Culture   Final    NO GROWTH 5 DAYS Performed at [facility], [place] [address] [place], [name] [address]    Report Status 11/07/2018 FINAL  Final  MRSA PCR Screening     Status: None   Collection Time: 11/03/18  8:50 PM  Result Value Ref Range Status   MRSA by PCR NEGATIVE NEGATIVE Final    Comment:        The GeneXpert MRSA Assay (FDA approved for NASAL specimens only), is one component of a comprehensive MRSA colonization surveillance program. It is not intended  to diagnose MRSA infection nor to guide or monitor treatment for MRSA infections. Performed at [facility], [place] [address], [place], [place] [address]       Radiology Studies: No results found.  Scheduled Meds: . chlordiazePOXIDE  5 mg Oral q12n4p  . enoxaparin (LOVENOX) injection  40 mg Subcutaneous Q24H  . folic acid  1 mg Oral Daily  . metoprolol tartrate  12.5 mg Oral BID  . multivitamin with minerals  1 tablet Oral Daily  . nicotine  21 mg Transdermal Daily  . thiamine  100 mg Oral Daily  . Warfarin - Pharmacist Dosing Inpatient   Does not apply q1800   Continuous Infusions:    LOS: 5 days    Time spent:35 mins. More than 50% of that time was spent in counseling and/or coordination of care.  Signature  Lala Lund M.D on 11/07/2018 at 11:43 AM   -  To page go to [email or web address] - password [facility]

## 2018-11-07 NOTE — Care Management Note (Addendum)
Case Management Note  Patient Details  Name: Leonard Bentley MRN: [id number] Date of Birth: Jan 25, 1961  Subjective/Objective:                 ETOH, fell, hematoma to leg   Action/Plan:  Patient from home alone, PT eval rec HH PT and RW. Spoke w patient. He states that he does not need a RW, he uses canes at home. Provided with Medicare ratings for [facility]. Placed referral to [facility] to evaluate if patient will qualify for [facility] pt through his Medicaid coverage. Awaiting to hear back.  Addendum- [facility] declined providing [place] services, as patient was referred before and they could not ever get ahold of him or any family members to initiate therapy.   Spoke w patient to update him that [facility] services were not available. He is agreeable to Outpatient PT. Referral placed to [facility].    Expected Discharge Date:                  Expected Discharge Plan:  Home/Self Care  In-House Referral:     Discharge planning Services  CM Consult  Post Acute Care Choice:    Choice offered to:     DME Arranged:    DME Agency:     HH Arranged:    [place] Agency:     Status of Service:  In process, will continue to follow  If discussed at Long Length of Stay Meetings, dates discussed:    Additional Comments:  [name], RN 11/07/2018, 1:57 PM

## 2018-11-08 LAB — MAGNESIUM: Magnesium: 1.8 mg/dL (ref 1.7–2.4)

## 2018-11-08 LAB — PROTIME-INR
INR: 1.15
Prothrombin Time: 14.6 seconds (ref 11.4–15.2)

## 2018-11-08 MED ORDER — WARFARIN SODIUM 7.5 MG PO TABS: 7.5000 mg | ORAL_TABLET | Freq: Once | ORAL | Status: DC

## 2018-11-08 MED ORDER — CHLORDIAZEPOXIDE HCL 5 MG PO CAPS: ORAL_CAPSULE | ORAL | 0 refills | Status: DC

## 2018-11-08 NOTE — Progress Notes (Signed)
ANTICOAGULATION CONSULT NOTE - Consult  Pharmacy Consult for warfarin Indication: pulmonary embolus and MVR repair  Allergies  Allergen Reactions  . Other Other (See Comments)    "Cactus" blisters on tongue    Patient Measurements: Height: 5' 9.5" (176.5 cm) Weight: 193 lb 5.5 oz (87.7 kg) IBW/kg (Calculated) : 71.85  Vital Signs: Temp: 98 F (36.7 C) (01/01 0603) Temp Source: Oral (01/01 0603) BP: 131/109 (01/01 0603) Pulse Rate: 83 (01/01 0603)  Labs: Recent Labs    11/06/18 0450 11/07/18 0408 11/08/18 0357  HGB 7.8* 8.4*  --   HCT 25.0* 27.5*  --   PLT 200 243  --   LABPROT 15.6* 15.6* 14.6  INR 1.25 1.25 1.15  CREATININE  --  0.78  --     Estimated Creatinine Clearance: 111.3 mL/min (by C-G formula based on SCr of 0.78 mg/dL).  Assessment: CC/HPI: 58 yo m presenting initially with hematoma to right thigh, then developed tremors from etoh withdrawal.  Anticoag:   Warfarin PTA for PE history (2014/2017), placed on Warfarin after MVR repair (no issues with Xarelto)  -12/26: INR > 10 on admit, s/p Vit K 5 mg po and 10 mg IV, Kcentra  INR 1.15 today. This probably from the effect of the large dose of vit k. Consider effect of long term EtOH abuse as well  Goal of Therapy:  INR 2-3 Monitor platelets by anticoagulation protocol: Yes   Plan:  Warfarin 7.5 mg x 1  Lovenox 40mg  SQ qday Monitor daily INR, CBC, clinical course, s/sx of bleed, PO intake, DDI   Thank you for allowing [place] to participate in this patients care.   Jens Som, PharmD Please utilize Amion (under [place]) for appropriate number for your unit pharmacist. 11/08/2018 9:40 AM

## 2018-11-08 NOTE — Progress Notes (Signed)
Patient informed of discharge by doctor. Patient given discharge instructions and prescriptions. IVs removed without complications. Patient dressed and rolled down in wheelchair by SWOT nurse. Patient took belongings with him at discharge and taking taxi home.

## 2018-11-08 NOTE — Discharge Instructions (Signed)
Follow with Primary MD [name], NP in 2 days   Get CBC, CMP, INR checked  by Primary MD in 2 days    Activity: As tolerated with Full fall precautions use walker/cane & assistance as needed  Disposition Home    Diet: Heart Healthy    Special Instructions: If you have smoked or chewed Tobacco  in the last 2 yrs please stop smoking, stop any regular Alcohol  and or any Recreational drug use.  On your next visit with your primary care physician please Get Medicines reviewed and adjusted.  Please request your Prim.MD to go over all Hospital Tests and Procedure/Radiological results at the follow up, please get all Hospital records sent to your Prim MD by signing hospital release before you go home.  If you experience worsening of your admission symptoms, develop shortness of breath, life threatening emergency, suicidal or homicidal thoughts you must seek medical attention immediately by calling 911 or calling your MD immediately  if symptoms less severe.  You Must read complete instructions/literature along with all the possible adverse reactions/side effects for all the Medicines you take and that have been prescribed to you. Take any new Medicines after you have completely understood and accpet all the possible adverse reactions/side effects.   Do not drive, operate heavy machinery, perform activities at heights, swimming or participation in water activities or provide baby sitting services if your were admitted for syncope or siezures until you have seen by Primary MD or a Neurologist and advised to do so again.

## 2018-11-08 NOTE — Discharge Summary (Signed)
Leonard Bentley ZHG:[id number] DOB: 27-Feb-1961 DOA: 11/01/2018  PCP: [name], NP  Admit date: 11/01/2018  Discharge date: 11/08/2018  Admitted From: Home   Disposition:  Home   Recommendations for Outpatient Follow-up:   Follow up with PCP in 1-2 weeks  PCP Please obtain BMP/CBC, 2 view CXR in 1week,  (see Discharge instructions)   PCP Please follow up on the following pending results: Monitor INR closely   Home Health: None   Equipment/Devices: None  Consultations: None Discharge Condition: Fair   CODE STATUS: Full   Diet Recommendation: Heart Healthy     CC - AMS  Brief history of present illness from the [place] of admission and additional interim summary    Patient is a 58 year old male with past medical history of pulmonary embolism on Coumadin, non-Hodgkin's lymphoma, mitral valve regurgitation status post repair, tobacco and alcohol abuse, depression, anxiety, hypertension who presented to the emergency room with complaints of pain and ecchymosis on his right medial thigh.  He was found to have supratherapeutic INR on presentation. His INR was more than 10 . Reversed with Kcentra and vitamin K. He developed  delirium tremens and remained agitated despite receiving several doses of Ativan.  He was moved to ICU for Precedex drip.TRH resuming care on 11/05/18.                                                                  Hospital Course   Delirium tremens: History of chronic alcohol abuse.  Multiple admissions for DTs in the past, he was placed here on Librium along with CIWA protocol with good effect, DTs have resolved, he has been counseled to quit alcohol, he has been counseled in detail about the risk of being on Coumadin, not getting his INR checked and drinking alcohol, he refused any home PT RN, he  refused any help like AA for alcohol abuse, he was taking his Coumadin without getting his INR checked for several weeks he has been counseled not to do that again and has been suggested to follow with outpatient clinic, appointment was set for him for tomorrow, he agrees with the plan and says he is not going to be drinking alcohol anymore.  Supratherapeutic INR  Right thigh hematoma.  INR was earlier this admission with Kcentra and vitamin K, he is not a ideal candidate for long-term anticoagulation unfortunately he provides a history of recurrent PE.  He has been clearly told that if he does not quit alcohol and stop going into DTs he is at risk for hurting his head and killing himself due to bleed.  He understands it.  He accepts the risks and benefits and wants to continue Coumadin with caution.  Lab Results  Component Value Date   INR 1.15 11/08/2018   INR  1.25 11/07/2018   INR 1.25 11/06/2018    Acute kidney injury: Resolved  History of pulmonary embolism: see above.  Chronic alcohol abuse: Drinks half a bottle of wine every Kerstein.  Continue thiamine and folic acid.Counselled for cessation.  Suspected urinary tract infection: Denied any dysuria, but patient is a poor historian .Urinalysis suggestive of  urinary tract infection.  Started on Rocephin.  Culture grew mixed organisms.  Antibiotics stopped.  Normocytic Anemia:  Could be associated  with left thigh hematoma.Also received lots of fluids ,so might be dilutional.   Tobacco abuse: Continue nicotine patch  Mitral regurgitation:  Status post minimally invasive mitral valve repair.  Does not require anticoagulation for this.  Sinus tachycardia: due to DTs, stable.  Hypomagnesemia:  replaced in stable.  Hypertension: Continue home regimen.  Debility/deconditioning: Requested for PT evaluation, patient refused home PT RN,  Discharge diagnosis     Principal Problem:   Supratherapeutic INR Active Problems:    Anemia   Cigarette smoker   History of pulmonary embolus (PE)   S/P minimally-invasive mitral valve repair   Alcohol withdrawal (HCC)   Tachyarrhythmia   Hypomagnesemia   HTN (hypertension)   UTI (urinary tract infection)    Discharge instructions    Discharge Instructions    Ambulatory referral to Physical Therapy   Complete by:  As directed    Diet - low sodium heart healthy   Complete by:  As directed    Discharge instructions   Complete by:  As directed    Follow with Primary MD [name], NP in 2 days   Get CBC, CMP, INR checked  by Primary MD in 2 days    Activity: As tolerated with Full fall precautions use walker/cane & assistance as needed  Disposition Home    Diet: Heart Healthy    Special Instructions: If you have smoked or chewed Tobacco  in the last 2 yrs please stop smoking, stop any regular Alcohol  and or any Recreational drug use.  On your next visit with your primary care physician please Get Medicines reviewed and adjusted.  Please request your Prim.MD to go over all Hospital Tests and Procedure/Radiological results at the follow up, please get all Hospital records sent to your Prim MD by signing hospital release before you go home.  If you experience worsening of your admission symptoms, develop shortness of breath, life threatening emergency, suicidal or homicidal thoughts you must seek medical attention immediately by calling 911 or calling your MD immediately  if symptoms less severe.  You Must read complete instructions/literature along with all the possible adverse reactions/side effects for all the Medicines you take and that have been prescribed to you. Take any new Medicines after you have completely understood and accpet all the possible adverse reactions/side effects.   Do not drive, operate heavy machinery, perform activities at heights, swimming or participation in water activities or provide baby sitting services if your were admitted  for syncope or siezures until you have seen by Primary MD or a Neurologist and advised to do so again.   Increase activity slowly   Complete by:  As directed       Discharge Medications   Allergies as of 11/08/2018      Reactions   Other Other (See Comments)   "Cactus" blisters on tongue      Medication List    TAKE these medications   acetaminophen 650 MG CR tablet Commonly known as:  TYLENOL Take 650 mg by  mouth every 8 (eight) hours as needed for pain.   chlordiazePOXIDE 5 MG capsule Commonly known as:  LIBRIUM Please dispense 9 pills -  Take 1 pill two times a Frix for 3 days, then Take 1 pill once a Kirschenbaum for 3 days and stop.   folic acid 1 MG tablet Commonly known as:  FOLVITE Take 1 tablet (1 mg total) by mouth daily.   metoprolol tartrate 25 MG tablet Commonly known as:  LOPRESSOR Take 1 tablet (25 mg total) by mouth 2 (two) times daily. What changed:    when to take this  reasons to take this   nicotine 14 mg/24hr patch Commonly known as:  NICODERM CQ - dosed in mg/24 hours Place 1 patch (14 mg total) onto the skin daily.   REDNESS RELIEVER EYE DROPS 0.05 % ophthalmic solution Generic drug:  tetrahydrozoline Place 1 drop into both eyes daily as needed (red eyes).   tadalafil 20 MG tablet Commonly known as:  ADCIRCA/CIALIS Take 20 mg by mouth daily as needed for erectile dysfunction.   thiamine 100 MG tablet Take 1 tablet (100 mg total) by mouth daily.   triamcinolone cream 0.1 % Commonly known as:  KENALOG Apply 1 application topically 2 (two) times daily. What changed:    when to take this  reasons to take this   warfarin 5 MG tablet Commonly known as:  COUMADIN Take as directed. If you are unsure how to take this medication, talk to your nurse or doctor. Original instructions:  Take 5 mg by mouth at bedtime.       Follow-up Information    [facility] Follow up.   Why:  Follow up appointment Thursday November 09, 2018 at 3:45 pm  - INR check Contact information: [place] Follow up.   Specialty:  Rehabilitation Why:  They will call you this week to schedule an appointment.  Contact information: [address] [id number] mc [place] [phone]       [facility]. Schedule an appointment as soon as possible for a visit in 2 Weyers(s).   Contact information: [address] [place] [address] [phone]          Major procedures and Radiology Reports - PLEASE review detailed and final reports thoroughly  -         Dg Chest 2 View  Result Date: 11/01/2018 CLINICAL DATA:  Right leg groin pain.  History of blood clots. EXAM: CHEST - 2 VIEW COMPARISON:  08/30/2018, 11/19/2016 and CT 11/27/2016 as well as CT 06/05/2018 FINDINGS: Lungs are adequately inflated without focal airspace consolidation or effusion. Subtle hazy reticulonodular density over the mid to lower lungs without significant change as demonstrated on previous CT scans. Stable prominence of the right hilar region. Borderline stable cardiomegaly. Remainder of the exam is unchanged. IMPRESSION: No acute cardiopulmonary disease. Mild stable hazy reticulonodular density over the mid to lower lungs as demonstrated on previous CT scans thought to represent chronic inflammatory versus atypical infectious process. Electronically Signed   By: Marin Olp M.D.   On: 11/01/2018 20:01   Dg Ankle 2 Views Left  Result Date: 11/02/2018 CLINICAL DATA:  Subacute onset of left medial ankle pain, swelling and bruising. EXAM: LEFT ANKLE - 2 VIEW COMPARISON:  Left ankle radiographs performed 02/10/2018 FINDINGS: A tiny osseous fragment dorsal to the anterior talus is  new from April and may reflect an avulsion fracture. The ankle mortise is intact; the interosseous space is  within normal limits. No talar tilt or subluxation is seen. The joint spaces are preserved. Soft tissue swelling is noted dorsal to the anterior talus. IMPRESSION: Tiny osseous fragment dorsal to the anterior talus is new from April and may reflect an avulsion fracture. Electronically Signed   By: Garald Balding M.D.   On: 11/02/2018 03:10   Vas [place] Lower Extremity Venous (dvt)  Result Date: 11/02/2018  Lower Venous Study Indications: Swelling, and Pain.  Risk Factors: History of PE and non-Hodgkin lymphoma. Anticoagulation: Coumadin. Performing Technologist: Oda Cogan RDMS, RVT  Examination Guidelines: A complete evaluation includes B-mode imaging, spectral Doppler, color Doppler, and power Doppler as needed of all accessible portions of each vessel. Bilateral testing is considered an integral part of a complete examination. Limited examinations for reoccurring indications may be performed as noted.  Right Venous Findings: +---------+---------------+---------+-----------+----------+-------+          CompressibilityPhasicitySpontaneityPropertiesSummary +---------+---------------+---------+-----------+----------+-------+ CFV      Full           Yes      Yes                          +---------+---------------+---------+-----------+----------+-------+ SFJ      Full                                                 +---------+---------------+---------+-----------+----------+-------+ FV Prox  Full                                                 +---------+---------------+---------+-----------+----------+-------+ FV Mid   Full                                                 +---------+---------------+---------+-----------+----------+-------+ FV DistalFull                                                 +---------+---------------+---------+-----------+----------+-------+ PFV      Full                                                  +---------+---------------+---------+-----------+----------+-------+ POP      Full           Yes      Yes                          +---------+---------------+---------+-----------+----------+-------+ PTV      Full                                                 +---------+---------------+---------+-----------+----------+-------+  PERO     Full                                                 +---------+---------------+---------+-----------+----------+-------+ GSV      Full                                                 +---------+---------------+---------+-----------+----------+-------+    Summary: Right: There is no evidence of deep vein thrombosis in the lower extremity. No cystic structure found in the popliteal fossa.  *See table(s) above for measurements and observations. Electronically signed by Monica Martinez MD on 11/02/2018 at 2:06:32 PM.    Final     Micro Results     Recent Results (from the past 240 hour(s))  Urine culture     Status: Abnormal   Collection Time: 11/01/18 10:48 PM  Result Value Ref Range Status   Specimen Description URINE, RANDOM  Final   Special Requests   Final    NONE Performed at [facility], [address], [place], [place] [address]    Culture MULTIPLE SPECIES PRESENT, SUGGEST RECOLLECTION (A)  Final   Report Status 11/03/2018 FINAL  Final  Culture, blood (x 2)     Status: None   Collection Time: 11/02/18  1:50 AM  Result Value Ref Range Status   Specimen Description BLOOD RIGHT HAND  Final   Special Requests   Final    BOTTLES DRAWN AEROBIC AND ANAEROBIC Blood Culture results may not be optimal due to an excessive volume of blood received in culture bottles   Culture   Final    NO GROWTH 5 DAYS Performed at [facility], [place] [address], [place], [place] [address]    Report Status 11/07/2018 FINAL  Final  Culture, blood (x 2)     Status: None   Collection Time: 11/02/18  2:10 AM  Result Value Ref Range Status     Specimen Description BLOOD RIGHT HAND  Final   Special Requests   Final    BOTTLES DRAWN AEROBIC AND ANAEROBIC Blood Culture adequate volume   Culture   Final    NO GROWTH 5 DAYS Performed at [facility], [place] [address], [place], [place] [address]    Report Status 11/07/2018 FINAL  Final  MRSA PCR Screening     Status: None   Collection Time: 11/03/18  8:50 PM  Result Value Ref Range Status   MRSA by PCR NEGATIVE NEGATIVE Final    Comment:        The GeneXpert MRSA Assay (FDA approved for NASAL specimens only), is one component of a comprehensive MRSA colonization surveillance program. It is not intended to diagnose MRSA infection nor to guide or monitor treatment for MRSA infections. Performed at [facility], [place] [address] [place],  [address]     Today   Subjective    Fardeen Folkes today has no headache,no chest abdominal pain,no new weakness tingling or numbness, feels much better wants to go home today.     Objective   Blood pressure 125/90, pulse 83, temperature 98 F (36.7 C), temperature source Oral, resp. rate 18, height 5' 9.5" (1.765 m), weight 87.7 kg, SpO2  100 %.  No intake or output data in the 24 hours ending 11/08/18 1018  Exam  Awake Alert, Oriented x 3, No new F.N deficits, Normal affect [place].AT,PERRAL Supple Neck,No JVD, No cervical lymphadenopathy appriciated.  Symmetrical Chest wall movement, Good air movement bilaterally, CTAB RRR,No Gallops,Rubs or new Murmurs, No Parasternal Heave +ve B.Sounds, Abd Soft, Non tender, No organomegaly appriciated, No rebound -guarding or rigidity. No Cyanosis, Clubbing or edema, No new Rash, Large R thigh bruise - improving   Data Review   CBC w Diff:  Lab Results  Component Value Date   WBC 4.4 11/07/2018   HGB 8.4 (L) 11/07/2018   HGB 12.8 (L) 09/21/2017   HCT 27.5 (L) 11/07/2018   HCT 40.2 09/21/2017   PLT 243 11/07/2018   PLT 221 09/21/2017   PLT 158 04/22/2016   LYMPHOPCT 14  11/07/2018   LYMPHOPCT 17.5 09/21/2017   MONOPCT 20 11/07/2018   MONOPCT 15.6 (H) 09/21/2017   EOSPCT 3 11/07/2018   EOSPCT 6.4 09/21/2017   BASOPCT 1 11/07/2018   BASOPCT 0.6 09/21/2017    CMP:  Lab Results  Component Value Date   NA 138 11/07/2018   NA 140 09/21/2017   K 3.8 11/07/2018   K 4.5 09/21/2017   CL 105 11/07/2018   CL 98 12/28/2012   CO2 24 11/07/2018   CO2 27 09/21/2017   BUN 7 11/07/2018   BUN 18.1 09/21/2017   CREATININE 0.78 11/07/2018   CREATININE 1.0 09/21/2017   PROT 6.4 (L) 11/07/2018   PROT 8.0 09/21/2017   ALBUMIN 3.3 (L) 11/07/2018   ALBUMIN 3.7 09/21/2017   BILITOT 1.7 (H) 11/07/2018   BILITOT 0.62 09/21/2017   ALKPHOS 72 11/07/2018   ALKPHOS 63 09/21/2017   AST 90 (H) 11/07/2018   AST 58 (H) 09/21/2017   ALT 84 (H) 11/07/2018   ALT 37 09/21/2017  . Lab Results  Component Value Date   INR 1.15 11/08/2018   INR 1.25 11/07/2018   INR 1.25 11/06/2018     Total Time in preparing paper work, data evaluation and todays exam - 70 minutes  Lala Lund M.D on 11/08/2018 at 10:18 AM  [facility]   Office  [phone]

## 2018-11-09 ENCOUNTER — Encounter: Payer: Self-pay | Admitting: Family Medicine

## 2018-11-09 ENCOUNTER — Inpatient Hospital Stay: Payer: Self-pay | Admitting: Family Medicine

## 2018-11-13 ENCOUNTER — Encounter: Payer: Self-pay | Admitting: Family Medicine

## 2018-11-13 ENCOUNTER — Ambulatory Visit (INDEPENDENT_AMBULATORY_CARE_PROVIDER_SITE_OTHER): Payer: Self-pay | Admitting: Family Medicine

## 2018-11-13 VITALS — BP 106/68 | HR 123 | Temp 97.3°F | Wt 189.6 lb

## 2018-11-13 DIAGNOSIS — M1990 Unspecified osteoarthritis, unspecified site: Secondary | ICD-10-CM

## 2018-11-13 DIAGNOSIS — F1010 Alcohol abuse, uncomplicated: Secondary | ICD-10-CM

## 2018-11-13 DIAGNOSIS — Z7901 Long term (current) use of anticoagulants: Secondary | ICD-10-CM

## 2018-11-13 DIAGNOSIS — N529 Male erectile dysfunction, unspecified: Secondary | ICD-10-CM

## 2018-11-13 LAB — PROTIME-INR
INR: 2.6 — ABNORMAL HIGH (ref 0.8–1.2)
Prothrombin Time: 25 s — ABNORMAL HIGH (ref 9.1–12.0)

## 2018-11-13 MED ORDER — TADALAFIL 20 MG PO TABS: 20.0000 mg | ORAL_TABLET | Freq: Every day | ORAL | 0 refills | Status: DC | PRN

## 2018-11-13 MED ORDER — DICLOFENAC SODIUM 1 % TD CREA: 1.0000 "application " | TOPICAL_CREAM | Freq: Two times a day (BID) | TRANSDERMAL | 1 refills | Status: DC

## 2018-11-13 NOTE — Progress Notes (Addendum)
   Subjective:    Patient ID: Leonard Bentley, male    DOB: January 15, 1961, 58 y.o.   MRN: [id number]  HPI He is here for a recheck on his PT/INR.  He was recently seen in the hospital and admitted due to supratherapeutic PT/INR.  He also continued to have difficulty with alcohol consumption.  He has had no difficulty with excessive bleeding or bruising but has had difficulty with anxiety.  He plans to get involved with AA and get involved with alcohol rehab.  Also he is without insurance and plans to go to [facility] and wellness.  He continues have difficulty with some foot pain that is probably arthritis in nature.  He apparently has used diclofenac topical for that.  He would also like a refill on Cialis.   Review of Systems     Objective:   Physical Exam Alert and in no distress.  Ecchymotic changes noted in the right mid arm area.       Assessment & Plan:  Current use of long term anticoagulation - Plan: Protime-INR  Alcohol abuse  Arthritis - Plan: Diclofenac Sodium 1 % CREA  Erectile dysfunction, unspecified erectile dysfunction type - Plan: tadalafil (ADCIRCA/CIALIS) 20 MG tablet Strongly encouraged him to get involved in AA as well as counseling for his alcohol abuse.  Cautioned with the use of diclofenac and his Coumadin.  He is to follow-up with the [facility] wellness center for further care since he does not have insurance.  He is to set up an appointment for another PT/INR in probably 2 weeks based on his results today.  PT/INR is 2.6.  He is to stay on his present regimen and recheck this in 2 weeks

## 2018-11-13 NOTE — Patient Instructions (Signed)
Good Rx Take tylenol for pain

## 2018-11-17 NOTE — Telephone Encounter (Signed)
Called pt to let them know that inr overdue pt stated no insurance and wants to be checked for free but has already been given one free visit

## 2018-11-22 ENCOUNTER — Other Ambulatory Visit: Payer: Self-pay

## 2018-11-22 DIAGNOSIS — I471 Supraventricular tachycardia: Secondary | ICD-10-CM

## 2018-11-22 NOTE — Telephone Encounter (Signed)
Patient scheduled for free charge f/u.

## 2018-11-22 NOTE — Telephone Encounter (Signed)
Called pt to schedule overdue inr free last one 1/22

## 2018-11-27 ENCOUNTER — Other Ambulatory Visit: Payer: Self-pay

## 2018-11-27 ENCOUNTER — Inpatient Hospital Stay (HOSPITAL_COMMUNITY)
Admission: EM | Admit: 2018-11-27 | Discharge: 2018-11-28 | DRG: 896 | Disposition: A | Discharge status: Home / Self Care | Payer: Self-pay | Attending: Internal Medicine | Admitting: Internal Medicine

## 2018-11-27 ENCOUNTER — Encounter (HOSPITAL_COMMUNITY): Payer: Self-pay | Admitting: Emergency Medicine

## 2018-11-27 DIAGNOSIS — H9222 Otorrhagia, left ear: Secondary | ICD-10-CM

## 2018-11-27 DIAGNOSIS — F10230 Alcohol dependence with withdrawal, uncomplicated: Secondary | ICD-10-CM

## 2018-11-27 DIAGNOSIS — I959 Hypotension, unspecified: Secondary | ICD-10-CM | POA: Diagnosis present

## 2018-11-27 DIAGNOSIS — Z9889 Other specified postprocedural states: Secondary | ICD-10-CM

## 2018-11-27 DIAGNOSIS — F329 Major depressive disorder, single episode, unspecified: Secondary | ICD-10-CM | POA: Diagnosis present

## 2018-11-27 DIAGNOSIS — I10 Essential (primary) hypertension: Secondary | ICD-10-CM | POA: Diagnosis present

## 2018-11-27 DIAGNOSIS — F10231 Alcohol dependence with withdrawal delirium: Principal | ICD-10-CM | POA: Diagnosis present

## 2018-11-27 DIAGNOSIS — F419 Anxiety disorder, unspecified: Secondary | ICD-10-CM | POA: Diagnosis present

## 2018-11-27 DIAGNOSIS — D72819 Decreased white blood cell count, unspecified: Secondary | ICD-10-CM | POA: Diagnosis present

## 2018-11-27 DIAGNOSIS — R791 Abnormal coagulation profile: Secondary | ICD-10-CM | POA: Diagnosis present

## 2018-11-27 DIAGNOSIS — R9431 Abnormal electrocardiogram [ECG] [EKG]: Secondary | ICD-10-CM | POA: Diagnosis present

## 2018-11-27 DIAGNOSIS — Z7901 Long term (current) use of anticoagulants: Secondary | ICD-10-CM

## 2018-11-27 DIAGNOSIS — Z86711 Personal history of pulmonary embolism: Secondary | ICD-10-CM

## 2018-11-27 DIAGNOSIS — F10239 Alcohol dependence with withdrawal, unspecified: Secondary | ICD-10-CM | POA: Diagnosis present

## 2018-11-27 DIAGNOSIS — F10930 Alcohol use, unspecified with withdrawal, uncomplicated: Secondary | ICD-10-CM

## 2018-11-27 DIAGNOSIS — Z8249 Family history of ischemic heart disease and other diseases of the circulatory system: Secondary | ICD-10-CM

## 2018-11-27 DIAGNOSIS — Z8572 Personal history of non-Hodgkin lymphomas: Secondary | ICD-10-CM

## 2018-11-27 DIAGNOSIS — Z888 Allergy status to other drugs, medicaments and biological substances status: Secondary | ICD-10-CM

## 2018-11-27 DIAGNOSIS — F17210 Nicotine dependence, cigarettes, uncomplicated: Secondary | ICD-10-CM | POA: Diagnosis present

## 2018-11-27 DIAGNOSIS — I340 Nonrheumatic mitral (valve) insufficiency: Secondary | ICD-10-CM | POA: Diagnosis present

## 2018-11-27 DIAGNOSIS — I2699 Other pulmonary embolism without acute cor pulmonale: Secondary | ICD-10-CM | POA: Diagnosis present

## 2018-11-27 DIAGNOSIS — Z79899 Other long term (current) drug therapy: Secondary | ICD-10-CM

## 2018-11-27 DIAGNOSIS — Z823 Family history of stroke: Secondary | ICD-10-CM

## 2018-11-27 DIAGNOSIS — F10939 Alcohol use, unspecified with withdrawal, unspecified: Secondary | ICD-10-CM | POA: Diagnosis present

## 2018-11-27 HISTORY — DX: Alcohol abuse, uncomplicated: F10.10

## 2018-11-27 LAB — COMPREHENSIVE METABOLIC PANEL
ALT: 59 U/L — ABNORMAL HIGH (ref 0–44)
AST: 114 U/L — ABNORMAL HIGH (ref 15–41)
Albumin: 4 g/dL (ref 3.5–5.0)
Alkaline Phosphatase: 62 U/L (ref 38–126)
Anion gap: 15 (ref 5–15)
BUN: 7 mg/dL (ref 6–20)
CO2: 25 mmol/L (ref 22–32)
Calcium: 9.6 mg/dL (ref 8.9–10.3)
Chloride: 99 mmol/L (ref 98–111)
Creatinine, Ser: 0.91 mg/dL (ref 0.61–1.24)
GFR calc Af Amer: >60 mL/min (ref >60)
GFR calc non Af Amer: >60 mL/min (ref >60)
Glucose, Bld: 109 mg/dL — ABNORMAL HIGH (ref 70–99)
Potassium: 4.2 mmol/L (ref 3.5–5.1)
Sodium: 139 mmol/L (ref 135–145)
Total Bilirubin: 0.7 mg/dL (ref 0.3–1.2)
Total Protein: 7.5 g/dL (ref 6.5–8.1)

## 2018-11-27 LAB — CBC
HCT: 39.2 % (ref 39.0–52.0)
Hemoglobin: 12.7 g/dL — ABNORMAL LOW (ref 13.0–17.0)
MCH: 27.3 pg (ref 26.0–34.0)
MCHC: 32.4 g/dL (ref 30.0–36.0)
MCV: 84.1 fL (ref 80.0–100.0)
Platelets: 185 10*3/uL (ref 150–400)
RBC: 4.66 MIL/uL (ref 4.22–5.81)
RDW: 18.2 % — ABNORMAL HIGH (ref 11.5–15.5)
WBC: 2.5 10*3/uL — ABNORMAL LOW (ref 4.0–10.5)
nRBC: 5.2 % — ABNORMAL HIGH (ref 0.0–0.2)

## 2018-11-27 LAB — PROTIME-INR
INR: >10
Prothrombin Time: >90 seconds — ABNORMAL HIGH (ref 11.4–15.2)

## 2018-11-27 LAB — RAPID URINE DRUG SCREEN, HOSP PERFORMED
Amphetamines: NOT DETECTED
Barbiturates: NOT DETECTED
Benzodiazepines: POSITIVE — AB
Cocaine: NOT DETECTED
Opiates: NOT DETECTED
Tetrahydrocannabinol: NOT DETECTED

## 2018-11-27 LAB — ETHANOL: Alcohol, Ethyl (B): 97 mg/dL — ABNORMAL HIGH (ref <10)

## 2018-11-27 MED ORDER — LORAZEPAM 2 MG/ML IJ SOLN
0.0000 mg | Freq: Four times a day (QID) | INTRAMUSCULAR | Status: DC
  Administered 2018-11-27: 2 mg via INTRAVENOUS
  Filled 2018-11-27: qty 1

## 2018-11-27 MED ORDER — SODIUM CHLORIDE 0.9 % IV SOLN
INTRAVENOUS | Status: DC
  Administered 2018-11-27: 08:00:00 via INTRAVENOUS

## 2018-11-27 MED ORDER — ACETAMINOPHEN 500 MG PO TABS: 500.0000 mg | ORAL_TABLET | Freq: Four times a day (QID) | ORAL | Status: DC | PRN

## 2018-11-27 MED ORDER — LORAZEPAM 2 MG/ML IJ SOLN: 0.0000 mg | Freq: Two times a day (BID) | INTRAMUSCULAR | Status: DC

## 2018-11-27 MED ORDER — VITAMIN B-1 100 MG PO TABS
100.0000 mg | ORAL_TABLET | Freq: Every day | ORAL | Status: DC
  Administered 2018-11-27 – 2018-11-28 (×2): 100 mg via ORAL
  Filled 2018-11-27 (×2): qty 1

## 2018-11-27 MED ORDER — NICOTINE 21 MG/24HR TD PT24
21.0000 mg | MEDICATED_PATCH | Freq: Every day | TRANSDERMAL | Status: DC
  Administered 2018-11-27 – 2018-11-28 (×2): 21 mg via TRANSDERMAL
  Filled 2018-11-27 (×2): qty 1

## 2018-11-27 MED ORDER — PHYTONADIONE 5 MG PO TABS
5.0000 mg | ORAL_TABLET | Freq: Once | ORAL | Status: AC
  Administered 2018-11-27: 5 mg via ORAL
  Filled 2018-11-27: qty 1

## 2018-11-27 MED ORDER — CHLORDIAZEPOXIDE HCL 25 MG PO CAPS
25.0000 mg | ORAL_CAPSULE | Freq: Three times a day (TID) | ORAL | Status: DC
  Administered 2018-11-27 – 2018-11-28 (×4): 25 mg via ORAL
  Filled 2018-11-27 (×4): qty 1

## 2018-11-27 MED ORDER — SODIUM CHLORIDE 0.9 % IV BOLUS
1000.0000 mL | Freq: Once | INTRAVENOUS | Status: AC
  Administered 2018-11-27: 1000 mL via INTRAVENOUS

## 2018-11-27 MED ORDER — LORAZEPAM 1 MG PO TABS
0.0000 mg | ORAL_TABLET | Freq: Four times a day (QID) | ORAL | Status: DC
  Administered 2018-11-27 – 2018-11-28 (×4): 1 mg via ORAL
  Filled 2018-11-27 (×4): qty 1

## 2018-11-27 MED ORDER — LORAZEPAM 1 MG PO TABS: 0.0000 mg | ORAL_TABLET | Freq: Two times a day (BID) | ORAL | Status: DC

## 2018-11-27 MED ORDER — THIAMINE HCL 100 MG/ML IJ SOLN: 100.0000 mg | Freq: Every day | INTRAMUSCULAR | Status: DC

## 2018-11-27 NOTE — H&P (Signed)
History and Physical    Leonard Bentley SAY:[id number] DOB: Feb 02, 1961 DOA: 11/27/2018  PCP: [name], NP  Patient coming from: home  Chief Complaint:  etoh withdrawal  HPI: Leonard Bentley is a 58 y.o. male with medical history significant of recurrent PE on Coumadin, alcohol abuse drinks a bottle of wine a [place], anxiety comes in because he scratches ear and it was bleeding.  He continue to bleed and would not stop so therefore he came to the ED.  He denies any bleeding from elsewhere.  No melanotic stool no bright red blood per rectum no vomiting blood.  He is on Coumadin he reports he takes his Coumadin as prescribed.  His INR was checked and is over 10.  While he was waiting in the ED however patient started to go through some mild alcohol withdrawal so was therefore referred for admission for alcohol withdrawal.  Review of Systems: As per HPI otherwise 10 point review of systems negative.   Past Medical History:  Diagnosis Date  . Acute pulmonary embolism ([place]) 04/11/2016  . Anxiety   . Arthritis   . Atrial tachycardia ([place]) 01/06/2016  . Depression   . DTs (delirium tremens) ([place])   . Dyspnea   . ED (erectile dysfunction)   . ETOH abuse   . Hypertension   . Lymphoma, non Hodgkin's 09/25/2011   Stage 2  . Mitral regurgitation 01/20/2016  . Mobitz I 10/09/2015  . Murmur 01/06/2016  . Occasional tremors   . PAC (premature atrial contraction) 10/09/2015  . S/P minimally-invasive mitral valve repair 10/27/2016   Complex valvuloplasty including artificial Gore-tex neochord placement x6 and 34 mm Edwards Physio II ring annuloplasty via right mini thoracotomy approach    Past Surgical History:  Procedure Laterality Date  . CARDIAC CATHETERIZATION N/A 08/13/2016   Procedure: Right/Left Heart Cath and Coronary Angiography;  Surgeon: [name], MD;  Location: [facility];  Service: Cardiovascular;  Laterality: N/A;  . MITRAL VALVE REPAIR Right 10/27/2016   Procedure:  MINIMALLY INVASIVE MITRAL VALVE REPAIR (MVR) USING 34 PHYSIO II ANNULOPLASTY RING;  Surgeon: [name], MD;  Location: [facility];  Service: Open Heart Surgery;  Laterality: Right;  . NO PAST SURGERIES    . SKIN BIOPSY Right 04/04/2018   right mid medial anterior tibial shave  see report in chart  . TEE WITHOUT CARDIOVERSION N/A 02/11/2016   Procedure: TRANSESOPHAGEAL ECHOCARDIOGRAM (TEE);  Surgeon: [name], MD;  Location: [facility];  Service: Cardiovascular;  Laterality: N/A;  . TEE WITHOUT CARDIOVERSION N/A 10/27/2016   Procedure: TRANSESOPHAGEAL ECHOCARDIOGRAM (TEE);  Surgeon: [name], MD;  Location: [facility];  Service: Open Heart Surgery;  Laterality: N/A;  . TRIGGER FINGER RELEASE Bilateral      reports that he has been smoking cigarettes and e-cigarettes. He has a 40.00 pack-year smoking history. He quit smokeless tobacco use about 35 years ago. He reports current alcohol use. He reports that he does not use drugs.  Allergies  Allergen Reactions  . Other Other (See Comments)    "Cactus" blisters on tongue    Family History  Problem Relation Age of Onset  . Cancer Mother        BREAST(BONE)  . Cancer Father        PANCREATIC  . Hypertension Maternal Grandmother   . Stroke Maternal Aunt   . Heart attack Neg Hx     Prior to Admission medications   Medication Sig Start Date End Date  Taking? Authorizing Provider  acetaminophen (TYLENOL) 650 MG CR tablet Take 650 mg by mouth every 8 (eight) hours as needed for pain.    [provider]  chlordiazePOXIDE (LIBRIUM) 5 MG capsule Please dispense 9 pills -  Take 1 pill two times a [place] for 3 days, then Take 1 pill once a [place] for 3 days and stop. 11/08/18   [name], MD  Diclofenac Sodium 1 % CREA Place 1 application onto the skin 2 (two) times daily. 11/13/18   [name], MD  folic acid (FOLVITE) 1 MG tablet Take 1 tablet (1 mg total) by mouth daily. Patient not taking: Reported on 08/30/2018 07/31/18    [name], MD  metoprolol tartrate (LOPRESSOR) 25 MG tablet Take 1 tablet (25 mg total) by mouth 2 (two) times daily. Patient taking differently: Take 25 mg by mouth daily as needed (weakness/dizziness).  06/12/18   Danford, Suann Larry, MD  nicotine (NICODERM CQ - DOSED IN MG/24 HOURS) 14 mg/24hr patch Place 1 patch (14 mg total) onto the skin daily. Patient not taking: Reported on 08/30/2018 07/31/18   [name], MD  tadalafil (ADCIRCA/CIALIS) 20 MG tablet Take 1 tablet (20 mg total) by mouth daily as needed for erectile dysfunction. 11/13/18   [name], MD  tetrahydrozoline (REDNESS RELIEVER EYE DROPS) 0.05 % ophthalmic solution Place 1 drop into both eyes daily as needed (red eyes).     [provider]  thiamine 100 MG tablet Take 1 tablet (100 mg total) by mouth daily. 07/31/18   [name], MD  triamcinolone cream (KENALOG) 0.1 % Apply 1 application topically 2 (two) times daily. Patient taking differently: Apply 1 application topically every 4 (four) hours as needed (bruising).  02/13/18   Tysinger, Camelia Eng, PA-C  warfarin (COUMADIN) 5 MG tablet Take 5 mg by mouth at bedtime.     [provider]    Physical Exam: Vitals:   11/27/18 0315 11/27/18 0330 11/27/18 0415 11/27/18 0445  BP: 112/85 126/77 114/90 (!) 115/93  Pulse: 91 87 84 (!) 134  Resp: 12 15 (!) 22 (!) 23  Temp:      TempSrc:      SpO2: 100% 99% 100% 98%  Weight:      Height:          Constitutional: NAD, calm, comfortable Vitals:   11/27/18 0315 11/27/18 0330 11/27/18 0415 11/27/18 0445  BP: 112/85 126/77 114/90 (!) 115/93  Pulse: 91 87 84 (!) 134  Resp: 12 15 (!) 22 (!) 23  Temp:      TempSrc:      SpO2: 100% 99% 100% 98%  Weight:      Height:       Eyes: PERRL, lids and conjunctivae normal ENMT: Mucous membranes are moist. Posterior pharynx clear of any exudate or lesions.Normal dentition.  Neck: normal, supple, no masses, no thyromegaly Respiratory: clear to  auscultation bilaterally, no wheezing, no crackles. Normal respiratory effort. No accessory muscle use.  Cardiovascular: Regular rate and rhythm, no murmurs / rubs / gallops. No extremity edema. 2+ pedal pulses. No carotid bruits.  Abdomen: no tenderness, no masses palpated. No hepatosplenomegaly. Bowel sounds positive.  Musculoskeletal: no clubbing / cyanosis. No joint deformity upper and lower extremities. Good ROM, no contractures. Normal muscle tone.  Skin: no rashes, lesions, ulcers. No induration Neurologic: CN 2-12 grossly intact. Sensation intact, DTR normal. Strength 5/5 in all 4.  Psychiatric: Normal judgment and insight. Alert and oriented  x 3. Normal mood.    Labs on Admission: I have personally reviewed following labs and imaging studies  CBC: Recent Labs  Lab 11/27/18 0241  WBC 2.5*  HGB 12.7*  HCT 39.2  MCV 84.1  PLT 734   Basic Metabolic Panel: Recent Labs  Lab 11/27/18 0241  NA 139  K 4.2  CL 99  CO2 25  GLUCOSE 109*  BUN 7  CREATININE 0.91  CALCIUM 9.6   GFR: Estimated Creatinine Clearance: 95.1 mL/min (by C-G formula based on SCr of 0.91 mg/dL). Liver Function Tests: Recent Labs  Lab 11/27/18 0241  AST 114*  ALT 59*  ALKPHOS 62  BILITOT 0.7  PROT 7.5  ALBUMIN 4.0   No results for input(s): LIPASE, AMYLASE in the last 168 hours. No results for input(s): AMMONIA in the last 168 hours. Coagulation Profile: Recent Labs  Lab 11/27/18 0240  INR >10.00*   Cardiac Enzymes: No results for input(s): CKTOTAL, CKMB, CKMBINDEX, TROPONINI in the last 168 hours. BNP (last 3 results) No results for input(s): PROBNP in the last 8760 hours. HbA1C: No results for input(s): HGBA1C in the last 72 hours. CBG: No results for input(s): GLUCAP in the last 168 hours. Lipid Profile: No results for input(s): CHOL, HDL, LDLCALC, TRIG, CHOLHDL, LDLDIRECT in the last 72 hours. Thyroid Function Tests: No results for input(s): TSH, T4TOTAL, FREET4, T3FREE,  THYROIDAB in the last 72 hours. Anemia Panel: No results for input(s): VITAMINB12, FOLATE, FERRITIN, TIBC, IRON, RETICCTPCT in the last 72 hours. Urine analysis:    Component Value Date/Time   COLORURINE RED (A) 11/01/2018 2149   APPEARANCEUR TURBID (A) 11/01/2018 2149   LABSPEC 1.018 11/01/2018 2149   PHURINE 7.0 11/01/2018 2149   GLUCOSEU NEGATIVE 11/01/2018 2149   HGBUR MODERATE (A) 11/01/2018 2149   BILIRUBINUR NEGATIVE 11/01/2018 2149   [place] NEGATIVE 11/01/2018 2149   PROTEINUR 100 (A) 11/01/2018 2149   UROBILINOGEN 1.0 01/25/2013 0825   NITRITE NEGATIVE 11/01/2018 2149   LEUKOCYTESUR NEGATIVE 11/01/2018 2149   Sepsis Labs: !!!!!!!!!!!!!!!!!!!!!!!!!!!!!!!!!!!!!!!!!!!! @LABRCNTIP (procalcitonin:4,lacticidven:4) )No results found for this or any previous visit (from the past 240 hour(s)).   Radiological Exams on Admission: No results found.  Old chart reviewed Case discussed with ms sanders in the ED PA  Assessment/Plan 58 year old male with supratherapeutic  INR with some mild alcohol withdrawal Principal Problem:   Alcohol withdrawal (HCC)-heart rate is improved already with some Ativan given in the ED.  Start him on oral Librium and place him on CIWA protocol.  No tremors right now and mental status is normal.  Possibly be able to discharge later today.  Active Problems:   Supratherapeutic INR-hold Coumadin.  Patient given vitamin K 5 mg p.o. in the ED.  Repeat INR daily.  No overt bleeding.  Reports compliance.    Anxiety-noted    Recurrent pulmonary emboli (HCC)-as above holding Coumadin at this time due to INR over 10   DVT prophylaxis: None INR over 10 Code Status: Full Family Communication: None Disposition Plan: 24 hours or less Consults called: None Admission status: Observation   Dorethia Jeanmarie A MD [facility]  If 7PM-7AM, please contact night-coverage [email or web address] Password [email or web address]  11/27/2018, 5:04 AM

## 2018-11-27 NOTE — ED Notes (Signed)
Lattie Haw, [place] notified of critical INR>10.

## 2018-11-27 NOTE — ED Provider Notes (Signed)
[facility] EMERGENCY DEPARTMENT Provider Note   CSN: [id number] Arrival date & time: 11/27/18  3300     History   Chief Complaint Chief Complaint  Patient presents with  . Bleeding from Ear  . Tremors    HPI Leonard Bentley is a 58 y.o. male.  The history is provided by the patient and medical records.    58 year old male with history of anxiety, depression, hypertension, alcohol abuse with history of DTs, history of PE on Coumadin, presenting to the ED for bleeding from his left ear.  Patient states he noticed this around noon yesterday, was very mild but states he has continued to notice a few drops of blood in his ear throughout the [place].  Reports he did cut his nails yesterday and is concerned he may have scratched the inside of his ear, but wanted to be sure nothing more severe had happened.  He has not had any changes in his hearing.  No headache or dizziness.  He is not have any significant ear pain.  He is on Coumadin, last INR check was about 2 weeks ago and was 2.6.    No adjustments to medications.  Of note, patient is very tremulous here in the ED.  States he has been like this since last evening.  He has a history of chronic alcohol abuse, has cut back significantly on his drinking.  He now drinks about a bottle of wine a [place], not at one sitting but sips on it throughout the [place].  States he is trying to quit.  He does have history of withdrawal seizures in the past but denies any seizures recently.  He was admitted earlier in the month for similar clinical picture.  He has not had any hallucinations.  Past Medical History:  Diagnosis Date  . Acute pulmonary embolism ([place]) 04/11/2016  . Anxiety   . Arthritis   . Atrial tachycardia ([place]) 01/06/2016  . Depression   . DTs (delirium tremens) ([place])   . Dyspnea   . ED (erectile dysfunction)   . ETOH abuse   . Hypertension   . Lymphoma, non Hodgkin's 09/25/2011   Stage 2  . Mitral regurgitation 01/20/2016  .  Mobitz I 10/09/2015  . Murmur 01/06/2016  . Occasional tremors   . PAC (premature atrial contraction) 10/09/2015  . S/P minimally-invasive mitral valve repair 10/27/2016   Complex valvuloplasty including artificial Gore-tex neochord placement x6 and 34 mm Edwards Physio II ring annuloplasty via right mini thoracotomy approach    Patient Active Problem List   Diagnosis Date Noted  . HTN (hypertension) 11/02/2018  . UTI (urinary tract infection) 11/02/2018  . Ecchymosis   . Tachyarrhythmia 08/30/2018  . Hypomagnesemia 08/30/2018  . Recurrent pulmonary emboli ([place])   . Hematuria   . Prolonged Q-T interval on ECG   . Delirium tremens ([place])   . LFTs abnormal   . Alcoholic hepatitis without ascites   . Alcohol withdrawal ([place]) 06/04/2018  . Dizziness   . Posterior tibial tendon dysfunction 02/21/2018  . Supratherapeutic INR 11/27/2016  . Dehydration 11/27/2016  . SVT (supraventricular tachycardia) ([place])   . S/P minimally-invasive mitral valve repair 10/27/2016  . History of pulmonary embolus (PE) 04/11/2016  . Left sided numbness   . Anxiety 04/02/2016  . Panic attacks 04/02/2016  . Mitral valve prolapse   . Mitral regurgitation 01/20/2016  . Atrial tachycardia ([place]) 01/06/2016  . Murmur 01/06/2016  . Mobitz I 10/09/2015  . PAC (premature atrial contraction)  10/09/2015  . Arthritis 08/14/2015  . Neuropathy due to chemotherapeutic drug ([place]) 03/09/2013  . Leucopenia 01/31/2013  . Personal history of pulmonary embolism 12/28/2012  . Alcohol abuse 08/24/2012  . Cigarette smoker 08/24/2012  . Hemorrhoid 03/08/2012  . Anemia 12/10/2011  . History of non-Hodgkin's lymphoma 09/25/2011    Past Surgical History:  Procedure Laterality Date  . CARDIAC CATHETERIZATION N/A 08/13/2016   Procedure: Right/Left Heart Cath and Coronary Angiography;  Surgeon: [name], MD;  Location: [facility];  Service: Cardiovascular;  Laterality: N/A;  . MITRAL VALVE REPAIR Right  10/27/2016   Procedure: MINIMALLY INVASIVE MITRAL VALVE REPAIR (MVR) USING 34 PHYSIO II ANNULOPLASTY RING;  Surgeon: [name], MD;  Location: [facility];  Service: Open Heart Surgery;  Laterality: Right;  . NO PAST SURGERIES    . SKIN BIOPSY Right 04/04/2018   right mid medial anterior tibial shave  see report in chart  . TEE WITHOUT CARDIOVERSION N/A 02/11/2016   Procedure: TRANSESOPHAGEAL ECHOCARDIOGRAM (TEE);  Surgeon: [name], MD;  Location: [facility];  Service: Cardiovascular;  Laterality: N/A;  . TEE WITHOUT CARDIOVERSION N/A 10/27/2016   Procedure: TRANSESOPHAGEAL ECHOCARDIOGRAM (TEE);  Surgeon: [name], MD;  Location: [facility];  Service: Open Heart Surgery;  Laterality: N/A;  . TRIGGER FINGER RELEASE Bilateral         Home Medications    Prior to Admission medications   Medication Sig Start Date End Date Taking? Authorizing Provider  acetaminophen (TYLENOL) 650 MG CR tablet Take 650 mg by mouth every 8 (eight) hours as needed for pain.    [provider]  chlordiazePOXIDE (LIBRIUM) 5 MG capsule Please dispense 9 pills -  Take 1 pill two times a [place] for 3 days, then Take 1 pill once a [place] for 3 days and stop. 11/08/18   [name], MD  Diclofenac Sodium 1 % CREA Place 1 application onto the skin 2 (two) times daily. 11/13/18   [name], MD  folic acid (FOLVITE) 1 MG tablet Take 1 tablet (1 mg total) by mouth daily. Patient not taking: Reported on 08/30/2018 07/31/18   [name], MD  metoprolol tartrate (LOPRESSOR) 25 MG tablet Take 1 tablet (25 mg total) by mouth 2 (two) times daily. Patient taking differently: Take 25 mg by mouth daily as needed (weakness/dizziness).  06/12/18   Danford, Suann Larry, MD  nicotine (NICODERM CQ - DOSED IN MG/24 HOURS) 14 mg/24hr patch Place 1 patch (14 mg total) onto the skin daily. Patient not taking: Reported on 08/30/2018 07/31/18   [name], MD  tadalafil (ADCIRCA/CIALIS) 20 MG tablet Take 1  tablet (20 mg total) by mouth daily as needed for erectile dysfunction. 11/13/18   [name], MD  tetrahydrozoline (REDNESS RELIEVER EYE DROPS) 0.05 % ophthalmic solution Place 1 drop into both eyes daily as needed (red eyes).     [provider]  thiamine 100 MG tablet Take 1 tablet (100 mg total) by mouth daily. 07/31/18   [name], MD  triamcinolone cream (KENALOG) 0.1 % Apply 1 application topically 2 (two) times daily. Patient taking differently: Apply 1 application topically every 4 (four) hours as needed (bruising).  02/13/18   Tysinger, Camelia Eng, PA-C  warfarin (COUMADIN) 5 MG tablet Take 5 mg by mouth at bedtime.     [provider]    Family History Family History  Problem Relation Age of Onset  . Cancer Mother  BREAST(BONE)  . Cancer Father        PANCREATIC  . Hypertension Maternal Grandmother   . Stroke Maternal Aunt   . Heart attack Neg Hx     Social History Social History   Tobacco Use  . Smoking status: Current Every [place] Smoker    Packs/[place]: 1.00    Years: 40.00    Pack years: 40.00    Types: Cigarettes, E-cigarettes  . Smokeless tobacco: Former Systems developer    Quit date: 1985  . Tobacco comment: vapes all [place]  Substance Use Topics  . Alcohol use: Yes    Alcohol/week: 0.0 standard drinks    Comment: 1-2 bottles of red wine a [place]  . Drug use: No     Allergies   Other   Review of Systems Review of Systems  HENT: Positive for ear pain.   Neurological: Positive for tremors.  All other systems reviewed and are negative.    Physical Exam Updated Vital Signs BP 115/89   Pulse (!) 134   Temp 97.8 F (36.6 C) (Oral)   Resp (!) 22   Ht 5\' 9"  (1.753 m)   Wt 86.2 kg   SpO2 100%   BMI 28.06 kg/m   Physical Exam Vitals signs and nursing note reviewed.  Constitutional:      Appearance: He is well-developed.  HENT:     Head: Normocephalic and atraumatic.     Right Ear: Tympanic membrane normal.     Left Ear: Tympanic  membrane normal.     Ears:     Comments: Both TM's are normal in appearance without signs of rupture; there is a small abrasion just inside the left ear canal; there is no active bleeding but dried blood present surrounding area and along tragus Eyes:     Conjunctiva/sclera: Conjunctivae normal.     Pupils: Pupils are equal, round, and reactive to light.  Neck:     Musculoskeletal: Normal range of motion.  Cardiovascular:     Rate and Rhythm: Regular rhythm. Tachycardia present.     Heart sounds: Normal heart sounds.  Pulmonary:     Effort: Pulmonary effort is normal.     Breath sounds: Normal breath sounds.  Abdominal:     General: Bowel sounds are normal.     Palpations: Abdomen is soft.  Musculoskeletal: Normal range of motion.  Skin:    General: Skin is warm and dry.  Neurological:     Mental Status: He is alert and oriented to person, place, and time.     Motor: Tremor present.     Comments: AAOx3, tremulous throughout exam in all 4 extremities, no seizure activity      ED Treatments / Results  Labs (all labs ordered are listed, but only abnormal results are displayed) Labs Reviewed  COMPREHENSIVE METABOLIC PANEL - Abnormal; Notable for the following components:      Result Value   Glucose, Bld 109 (*)    AST 114 (*)    ALT 59 (*)    All other components within normal limits  ETHANOL - Abnormal; Notable for the following components:   Alcohol, Ethyl (B) 97 (*)    All other components within normal limits  CBC - Abnormal; Notable for the following components:   WBC 2.5 (*)    Hemoglobin 12.7 (*)    RDW 18.2 (*)    nRBC 5.2 (*)    All other components within normal limits  PROTIME-INR - Abnormal; Notable for the following components:  Prothrombin Time >90.0 (*)    INR >10.00 (*)    All other components within normal limits  RAPID URINE DRUG SCREEN, HOSP PERFORMED    EKG EKG Interpretation  Date/Time:  Monday November 27 2018 02:59:18 EST Ventricular Rate:    89 PR Interval:    QRS Duration: 84 QT Interval:  393 QTC Calculation: 479 R Axis:   -37 Text Interpretation:  Sinus rhythm Left axis deviation ST elevation, consider inferior injury Borderline prolonged QT interval No significant change since last tracing Confirmed by Pryor Curia [phone]) on 11/27/2018 3:21:53 AM   Radiology No results found.  Procedures Procedures (including critical care time)  CRITICAL CARE Performed by: Larene Pickett   Total critical care time: 45 minutes  Critical care time was exclusive of separately billable procedures and treating other patients.  Critical care was necessary to treat or prevent imminent or life-threatening deterioration.  Critical care was time spent personally by me on the following activities: development of treatment plan with patient and/or surrogate as well as nursing, discussions with consultants, evaluation of patient's response to treatment, examination of patient, obtaining history from patient or surrogate, ordering and performing treatments and interventions, ordering and review of laboratory studies, ordering and review of radiographic studies, pulse oximetry and re-evaluation of patient's condition.   Medications Ordered in ED Medications  LORazepam (ATIVAN) injection 0-4 mg (2 mg Intravenous Given 11/27/18 0349)    Or  LORazepam (ATIVAN) tablet 0-4 mg ( Oral See Alternative 11/27/18 0349)  LORazepam (ATIVAN) injection 0-4 mg (has no administration in time range)    Or  LORazepam (ATIVAN) tablet 0-4 mg (has no administration in time range)  thiamine (VITAMIN B-1) tablet 100 mg (has no administration in time range)    Or  thiamine (B-1) injection 100 mg (has no administration in time range)  phytonadione (VITAMIN K) tablet 5 mg (has no administration in time range)  sodium chloride 0.9 % bolus 1,000 mL (0 mLs Intravenous Stopped 11/27/18 0425)     Initial Impression / Assessment and Plan / ED Course  I have reviewed  the triage vital signs and the nursing notes.  Pertinent labs & imaging results that were available during my care of the patient were reviewed by me and considered in my medical decision making (see chart for details).  58 year old male here with bleeding from his left ear canal.  Reports this began around noon yesterday.  Does report trimming his nails and thinks he scratched the inside of his left ear.  On exam, patient does have small abrasion inside the left ear canal but there is no active bleeding.  There is dried blood in the canal and surrounding the tragus.  TM appears intact without signs of rupture.  Patient is notably tremulous.  He has a longstanding history of alcohol abuse, reports he is trying to cut back.  He is tachycardic here but blood pressure is stable.  He is not hallucinating.  He is tremulous but no reported seizure activity.  Last drink was yesterday afternoon, drinks approx 1 bottle of wine daily.  Clinical concern for withdrawal.  Labs pending including INR.  Initial CIWA score 16, started on CIWA protocol, given IVF.  Will monitor.  4:46 AM INR > 10.  Was WNL 2 weeks ago.  Patient reassessed--he is adamant that he has been taking his medications only as directed at 6 PM every night, states sometimes he is late but never doubles up his dose.  He denies any  other bleeding such as nosebleeds, blood in stool, bleeding of the gums, etc.  during prior admission, felt this was due to his alcohol abuse.  He still remains tachycardic into the 130's after IVF and ativan.  Still tremulous but not as bad as initial evaluation.  No hallucinations.  With INR > 10 and continued withdrawal symptoms, feel he will require admission.  Discussed with Dr. Shanon Brow-- will admit for ongoing care.  Final Clinical Impressions(s) / ED Diagnoses   Final diagnoses:  Supratherapeutic INR  Alcohol withdrawal syndrome without complication [facility])    ED Discharge Orders    None       Larene Pickett,  PA-C 11/27/18 0603    Ward, Delice Bison, DO 11/27/18 [phone]

## 2018-11-27 NOTE — Progress Notes (Signed)
[facility]  This patient was admitted by my colleague earlier this morning. I have reviewed the chart and examined the patient.   Today's Vitals   11/27/18 0730 11/27/18 0814 11/27/18 0843 11/27/18 1131  BP: 109/87 (!) 127/92  107/85  Pulse: (!) 130 (!) 128  (!) 131  Resp: (!) 21 (!) 21    Temp:  98.6 F (37 C)    TempSrc:  Oral    SpO2: 99% 100%    Weight:      Height:      PainSc:   3     Body mass index is 28.06 kg/m.   Principal Problem:   Alcohol withdrawal  - drinks 1 bottle of wine a [place] - in active withdrawal at this time- cont CIWA scale - social work consult  Active Problems:      Recurrent pulmonary emboli -   Supratherapeutic INR with bleeding from ear - INR > 10- it was > 10 on 1/1 at [facility] when he was admitted to the hospital for a right thigh hematoma - recommend closer follow ups with PCP to check INR (weekly for now) - he states he has had PEs twice in his life (2014 and 2017) and was told by Dr Julien Nordmann that he needed life long anticoagulation-  - bleeding has stopped  - given vit K - f/u INR tomorrow - he states DOACs were too expensive for him- states he is compliant with Coumadin   Nicotine abuse - start Nicotine patch  H/o HTN - BP low- hold off on Metoprolol  Chronic leukopenia - follow  Chronic anemia - interestingly Hb is up to 12.7 from a baseline of 7-8 (last checked on 12/19)-may be inaccurate- will recheck tomorrow  H/o Non Hodgkin's Lymphoma - treated   h/o minimally invasive mitral valve repair  Debbe Odea, MD Pager : [email or web address]

## 2018-11-27 NOTE — ED Triage Notes (Signed)
BIB EMS from home. Called out for small amount of blood coming from ear. Pt noted to be significantly tremoring in triage, pt has hx of ETOH abuse. Pt states he last drank yesterday.

## 2018-11-28 ENCOUNTER — Telehealth: Payer: Self-pay

## 2018-11-28 DIAGNOSIS — F419 Anxiety disorder, unspecified: Secondary | ICD-10-CM

## 2018-11-28 DIAGNOSIS — I340 Nonrheumatic mitral (valve) insufficiency: Secondary | ICD-10-CM

## 2018-11-28 DIAGNOSIS — Z8572 Personal history of non-Hodgkin lymphomas: Secondary | ICD-10-CM

## 2018-11-28 DIAGNOSIS — Z9889 Other specified postprocedural states: Secondary | ICD-10-CM

## 2018-11-28 DIAGNOSIS — F17210 Nicotine dependence, cigarettes, uncomplicated: Secondary | ICD-10-CM

## 2018-11-28 DIAGNOSIS — H9222 Otorrhagia, left ear: Secondary | ICD-10-CM

## 2018-11-28 LAB — BASIC METABOLIC PANEL
Anion gap: 9 (ref 5–15)
BUN: 12 mg/dL (ref 6–20)
CO2: 26 mmol/L (ref 22–32)
Calcium: 9.2 mg/dL (ref 8.9–10.3)
Chloride: 103 mmol/L (ref 98–111)
Creatinine, Ser: 0.95 mg/dL (ref 0.61–1.24)
GFR calc Af Amer: >60 mL/min (ref >60)
GFR calc non Af Amer: >60 mL/min (ref >60)
Glucose, Bld: 105 mg/dL — ABNORMAL HIGH (ref 70–99)
Potassium: 4.2 mmol/L (ref 3.5–5.1)
Sodium: 138 mmol/L (ref 135–145)

## 2018-11-28 LAB — PROTIME-INR
INR: 6.19
Prothrombin Time: 53.8 seconds — ABNORMAL HIGH (ref 11.4–15.2)

## 2018-11-28 LAB — CBC
HCT: 33.3 % — ABNORMAL LOW (ref 39.0–52.0)
Hemoglobin: 10.6 g/dL — ABNORMAL LOW (ref 13.0–17.0)
MCH: 26.7 pg (ref 26.0–34.0)
MCHC: 31.8 g/dL (ref 30.0–36.0)
MCV: 83.9 fL (ref 80.0–100.0)
Platelets: 142 10*3/uL — ABNORMAL LOW (ref 150–400)
RBC: 3.97 MIL/uL — ABNORMAL LOW (ref 4.22–5.81)
RDW: 17.9 % — ABNORMAL HIGH (ref 11.5–15.5)
WBC: 2.5 10*3/uL — ABNORMAL LOW (ref 4.0–10.5)
nRBC: 2.4 % — ABNORMAL HIGH (ref 0.0–0.2)

## 2018-11-28 MED ORDER — NICOTINE 21 MG/24HR TD PT24: 21.0000 mg | MEDICATED_PATCH | Freq: Every day | TRANSDERMAL | 0 refills | Status: DC

## 2018-11-28 MED ORDER — THIAMINE HCL 100 MG PO TABS: 100.0000 mg | ORAL_TABLET | Freq: Every day | ORAL | 0 refills | Status: DC

## 2018-11-28 MED ORDER — CHLORDIAZEPOXIDE HCL 25 MG PO CAPS
25.0000 mg | ORAL_CAPSULE | Freq: Four times a day (QID) | ORAL | Status: DC
  Administered 2018-11-28: 25 mg via ORAL
  Filled 2018-11-28: qty 1

## 2018-11-28 MED ORDER — CHLORDIAZEPOXIDE HCL 10 MG PO CAPS: ORAL_CAPSULE | ORAL | 0 refills | Status: DC

## 2018-11-28 NOTE — Telephone Encounter (Signed)
Pharmacy was called to advise. [place]

## 2018-11-28 NOTE — Discharge Summary (Signed)
Physician Discharge Summary  Alden Bensinger Pennie WER:[id number] DOB: 07-Apr-1961 DOA: 11/27/2018  PCP: [name], NP  Admit date: 11/27/2018 Discharge date: 11/28/2018  Admitted From: home  Disposition:  home   Recommendations for Outpatient Follow-up:  1. F/u INR on Friday- Coumadin is on hold 2. F/u BP on Friday- Metoprolol is on hold due to hypotension 3. F/u on left ear on Friday- see below  Home Health:  none    Discharge Condition:  stable   CODE STATUS:  Full code   Diet recommendation:  Heart healthy Consultations:  none    Discharge Diagnoses:  Principal Problem:   Alcohol withdrawal ([place]) Active Problems:   Supratherapeutic INR   Recurrent pulmonary emboli (HCC)   Bleeding from left ear   History of non-Hodgkin's lymphoma   Cigarette smoker   Mitral regurgitation   Anxiety   S/P minimally-invasive mitral valve repair    HPI: Leonard Bentley is a 58 y.o. male with medical history significant of recurrent PE on Coumadin, alcohol abuse drinks a bottle of wine a [place], anxiety comes in because he scratches ear and it was bleeding.  He continue to bleed and would not stop so therefore he came to the ED.  He denies any bleeding from elsewhere.  No melanotic stool no bright red blood per rectum no vomiting blood.  He is on Coumadin he reports he takes his Coumadin as prescribed.  His INR was checked and is over 10.  While he was waiting in the ED however patient started to go through some mild alcohol withdrawal so was therefore referred for admission for alcohol withdrawal.  Hospital Course:  Acquired coagulopathy- h/o pulmonary emboli - INR > 10 on admission and now 6.19 after 5 mg of Vit K - of note, INR was > 10 on 1/1 at [facility] when he was admitted to the hospital for a right thigh hematoma - states he last had his INR checked 3 weeks ago by Dr Redmond School and it was in the 2-3 range - have asked him to hold Coumadin for now and have INR checked in 3 days by Dr Redmond School -  recommend closer follow ups with PCP to check INR (maybe weekly for now) - he states he has had PEs twice in his life (2014 and 2017) and was told by Dr Julien Nordmann that he needed life long anticoagulation-  - he states DOACs were too expensive for him-   Alcohol withdrawal - has nearly resolved- will d/c with Librium taper  Left ear bleeding - has resolved- he still has a dried clot in the ear canal which I have advised him not to disturb for now and allow to fall off on its own  Hypotension with h/o HTN - holding Metoprolol for now- will need BP check on Friday and resumption of Metoprolol if needed  Anxiety - he is asking me for Xanax for his underlying anxiety- discussed risks of mixing Xanax with Alcohol -  I feel he needs to to start an SSIR or SNRI once his withdrawal resolves- should be followed by PCP  Nicotine abuse -21 mg  Nicotine patches prescribed- I have told the patient that further can be prescribed by PCP if he is serious about quitting smoking  Discharge Exam: Vitals:   11/27/18 2107 11/28/18 0501  BP: (!) 119/93 (!) 116/96  Pulse: (!) 114 (!) 129  Resp: (!) 30 (!) 22  Temp: 98.8 F (37.1 C) 98.2 F (36.8 C)  SpO2: 99% 98%  Vitals:   11/27/18 0814 11/27/18 1131 11/27/18 2107 11/28/18 0501  BP: (!) 127/92 107/85 (!) 119/93 (!) 116/96  Pulse: (!) 128 (!) 131 (!) 114 (!) 129  Resp: (!) 21  (!) 30 (!) 22  Temp: 98.6 F (37 C)  98.8 F (37.1 C) 98.2 F (36.8 C)  TempSrc: Oral  Oral Oral  SpO2: 100%  99% 98%  Weight:      Height:        General: Pt is alert, awake, not in acute distress Cardiovascular: RRR, S1/S2 +, no rubs, no gallops Respiratory: CTA bilaterally, no wheezing, no rhonchi Abdominal: Soft, NT, ND, bowel sounds + Extremities: no edema, no cyanosis   Discharge Instructions  Discharge Instructions    Diet - low sodium heart healthy   Complete by:  As directed    Increase activity slowly   Complete by:  As directed      Allergies as  of 11/28/2018      Reactions   Other Other (See Comments)   "Cactus" blisters on tongue      Medication List    STOP taking these medications   metoprolol tartrate 25 MG tablet Commonly known as:  LOPRESSOR   nicotine 14 mg/24hr patch Commonly known as:  NICODERM CQ - dosed in mg/24 hours Replaced by:  nicotine 21 mg/24hr patch   tadalafil 20 MG tablet Commonly known as:  ADCIRCA/CIALIS   warfarin 5 MG tablet Commonly known as:  COUMADIN     TAKE these medications   acetaminophen 650 MG CR tablet Commonly known as:  TYLENOL Take 650 mg by mouth every 8 (eight) hours as needed for pain.   chlordiazePOXIDE 10 MG capsule Commonly known as:  LIBRIUM Take 2 tab 4 x a [place] as needed for withdrawal symptoms for the next 2 days Decrease to 1 tab every 4 hrs as needed for the following 2 days Stop after 4 days of use. What changed:    medication strength  additional instructions   Diclofenac Sodium 1 % Crea Place 1 application onto the skin 2 (two) times daily.   nicotine 21 mg/24hr patch Commonly known as:  NICODERM CQ - dosed in mg/24 hours Place 1 patch (21 mg total) onto the skin daily. Your physician will need to prescribe subsequent patches Replaces:  nicotine 14 mg/24hr patch   REDNESS RELIEVER EYE DROPS 0.05 % ophthalmic solution Generic drug:  tetrahydrozoline Place 1 drop into both eyes daily as needed (red eyes).   thiamine 100 MG tablet Take 1 tablet (100 mg total) by mouth daily.   triamcinolone cream 0.1 % Commonly known as:  KENALOG Apply 1 application topically 2 (two) times daily. What changed:    when to take this  reasons to take this      Follow-up Information    [name], MD Follow up.   Specialty:  Family Medicine Why:  see on Friday.  Contact information: [address] [place] [address] [phone]          Allergies  Allergen Reactions  . Other Other (See Comments)    "Cactus" blisters on tongue      Procedures/Studies:    Dg Chest 2 View  Result Date: 11/01/2018 CLINICAL DATA:  Right leg groin pain.  History of blood clots. EXAM: CHEST - 2 VIEW COMPARISON:  08/30/2018, 11/19/2016 and CT 11/27/2016 as well as CT 06/05/2018 FINDINGS: Lungs are adequately inflated without focal airspace consolidation or effusion. Subtle hazy reticulonodular density over the  mid to lower lungs without significant change as demonstrated on previous CT scans. Stable prominence of the right hilar region. Borderline stable cardiomegaly. Remainder of the exam is unchanged. IMPRESSION: No acute cardiopulmonary disease. Mild stable hazy reticulonodular density over the mid to lower lungs as demonstrated on previous CT scans thought to represent chronic inflammatory versus atypical infectious process. Electronically Signed   By: Marin Olp M.D.   On: 11/01/2018 20:01   Dg Ankle 2 Views Left  Result Date: 11/02/2018 CLINICAL DATA:  Subacute onset of left medial ankle pain, swelling and bruising. EXAM: LEFT ANKLE - 2 VIEW COMPARISON:  Left ankle radiographs performed 02/10/2018 FINDINGS: A tiny osseous fragment dorsal to the anterior talus is new from April and may reflect an avulsion fracture. The ankle mortise is intact; the interosseous space is within normal limits. No talar tilt or subluxation is seen. The joint spaces are preserved. Soft tissue swelling is noted dorsal to the anterior talus. IMPRESSION: Tiny osseous fragment dorsal to the anterior talus is new from April and may reflect an avulsion fracture. Electronically Signed   By: Garald Balding M.D.   On: 11/02/2018 03:10   Vas [place] Lower Extremity Venous (dvt)  Result Date: 11/02/2018  Lower Venous Study Indications: Swelling, and Pain.  Risk Factors: History of PE and non-Hodgkin lymphoma. Anticoagulation: Coumadin. Performing Technologist: Oda Cogan RDMS, RVT  Examination Guidelines: A complete evaluation includes B-mode imaging, spectral  Doppler, color Doppler, and power Doppler as needed of all accessible portions of each vessel. Bilateral testing is considered an integral part of a complete examination. Limited examinations for reoccurring indications may be performed as noted.  Right Venous Findings: +---------+---------------+---------+-----------+----------+-------+          CompressibilityPhasicitySpontaneityPropertiesSummary +---------+---------------+---------+-----------+----------+-------+ CFV      Full           Yes      Yes                          +---------+---------------+---------+-----------+----------+-------+ SFJ      Full                                                 +---------+---------------+---------+-----------+----------+-------+ FV Prox  Full                                                 +---------+---------------+---------+-----------+----------+-------+ FV Mid   Full                                                 +---------+---------------+---------+-----------+----------+-------+ FV DistalFull                                                 +---------+---------------+---------+-----------+----------+-------+ PFV      Full                                                 +---------+---------------+---------+-----------+----------+-------+  POP      Full           Yes      Yes                          +---------+---------------+---------+-----------+----------+-------+ PTV      Full                                                 +---------+---------------+---------+-----------+----------+-------+ PERO     Full                                                 +---------+---------------+---------+-----------+----------+-------+ GSV      Full                                                 +---------+---------------+---------+-----------+----------+-------+    Summary: Right: There is no evidence of deep vein thrombosis in the lower extremity. No  cystic structure found in the popliteal fossa.  *See table(s) above for measurements and observations. Electronically signed by Monica Martinez MD on 11/02/2018 at 2:06:32 PM.    Final      The results of significant diagnostics from this hospitalization (including imaging, microbiology, ancillary and laboratory) are listed below for reference.     Microbiology: No results found for this or any previous visit (from the past 240 hour(s)).   Labs: BNP (last 3 results) No results for input(s): BNP in the last 8760 hours. Basic Metabolic Panel: Recent Labs  Lab 11/27/18 0241 11/28/18 0642  NA 139 138  K 4.2 4.2  CL 99 103  CO2 25 26  GLUCOSE 109* 105*  BUN 7 12  CREATININE 0.91 0.95  CALCIUM 9.6 9.2   Liver Function Tests: Recent Labs  Lab 11/27/18 0241  AST 114*  ALT 59*  ALKPHOS 62  BILITOT 0.7  PROT 7.5  ALBUMIN 4.0   No results for input(s): LIPASE, AMYLASE in the last 168 hours. No results for input(s): AMMONIA in the last 168 hours. CBC: Recent Labs  Lab 11/27/18 0241 11/28/18 0642  WBC 2.5* 2.5*  HGB 12.7* 10.6*  HCT 39.2 33.3*  MCV 84.1 83.9  PLT 185 142*   Cardiac Enzymes: No results for input(s): CKTOTAL, CKMB, CKMBINDEX, TROPONINI in the last 168 hours. BNP: Invalid input(s): POCBNP CBG: No results for input(s): GLUCAP in the last 168 hours. D-Dimer No results for input(s): DDIMER in the last 72 hours. Hgb A1c No results for input(s): HGBA1C in the last 72 hours. Lipid Profile No results for input(s): CHOL, HDL, LDLCALC, TRIG, CHOLHDL, LDLDIRECT in the last 72 hours. Thyroid function studies No results for input(s): TSH, T4TOTAL, T3FREE, THYROIDAB in the last 72 hours.  Invalid input(s): FREET3 Anemia work up No results for input(s): VITAMINB12, FOLATE, FERRITIN, TIBC, IRON, RETICCTPCT in the last 72 hours. Urinalysis    Component Value Date/Time   COLORURINE RED (A) 11/01/2018 2149   APPEARANCEUR TURBID (A) 11/01/2018 2149   LABSPEC  1.018 11/01/2018 2149   PHURINE 7.0 11/01/2018 2149   GLUCOSEU NEGATIVE 11/01/2018 2149  HGBUR MODERATE (A) 11/01/2018 2149   BILIRUBINUR NEGATIVE 11/01/2018 2149   [place] NEGATIVE 11/01/2018 2149   PROTEINUR 100 (A) 11/01/2018 2149   UROBILINOGEN 1.0 01/25/2013 0825   NITRITE NEGATIVE 11/01/2018 2149   LEUKOCYTESUR NEGATIVE 11/01/2018 2149   Sepsis Labs Invalid input(s): PROCALCITONIN,  WBC,  LACTICIDVEN Microbiology No results found for this or any previous visit (from the past 240 hour(s)).   Time coordinating discharge in minutes: 65  SIGNED:   Debbe Odea, MD  [facility] 11/28/2018, 9:15 AM Pager   If 7PM-7AM, please contact night-coverage [email or web address] Password [email or web address]

## 2018-11-28 NOTE — Telephone Encounter (Signed)
Patient called stating that pharmacy would not release Diclofenac sodium because it did not have the measure of how much he should be using. Please advise.

## 2018-11-28 NOTE — Telephone Encounter (Signed)
Pharmacy wants to know how much diclofenac cream should pt use. One application could either be 2 or 4 grams . Please advise [facility]

## 2018-11-28 NOTE — Telephone Encounter (Signed)
2 grams

## 2018-11-28 NOTE — Progress Notes (Signed)
Discharged home today. [place] belongings ,discharged instructions given to patient.Verbalized understanding of instructions

## 2018-11-28 NOTE — Discharge Instructions (Signed)
It is imperative that you see your PCP by Friday. You will need your INR checked and advice on how to take your Coumadin. You will need your BP checked and advice on when to resume your Metoprolol. Please discuss your anxiety issues with your PCP as well. Do not use Tadalafil until withdrawal has resolved. Your PCP will also need to prescribe you subsequent Nicotine patches at lower dose (if you continue to abstain from smoking).   You were cared for by a hospitalist during your hospital stay. If you have any questions about your discharge medications or the care you received while you were in the hospital after you are discharged, you can call the unit and asked to speak with the hospitalist on call if the hospitalist that took care of you is not available. Once you are discharged, your primary care physician will handle any further medical issues.   Please note that NO REFILLS for any discharge medications will be authorized once you are discharged, as it is imperative that you return to your primary care physician (or establish a relationship with a primary care physician if you do not have one) for your aftercare needs so that they can reassess your need for medications and monitor your lab values.  Please take all your medications with you for your next visit with your Primary MD. Please ask your Primary MD to get all Hospital records sent to his/her office. Please request your Primary MD to go over all hospital test results at the follow up.   If you experience worsening of your admission symptoms, develop shortness of breath, chest pain, suicidal or homicidal thoughts or a life threatening emergency, you must seek medical attention immediately by calling 911 or calling your MD.   Dennis Bast must read the complete instructions/literature along with all the possible adverse reactions/side effects for all the medicines you take including new medications that have been prescribed to you. Take new medicines  after you have completely understood and accpet all the possible adverse reactions/side effects.    Do not drive when taking pain medications or sedatives.     Do not take more than prescribed Pain, Sleep and Anxiety Medications   If you have smoked or chewed Tobacco in the last 2 yrs please stop. Stop any regular alcohol  and or recreational drug use.   Wear Seat belts while driving.

## 2018-11-28 NOTE — Telephone Encounter (Signed)
Waiting for Dr. Redmond School to reply to message and pharmacy will be called. [place]

## 2018-11-29 NOTE — Telephone Encounter (Signed)
Other messages stated that pharmacy was contacted.

## 2018-12-06 NOTE — Progress Notes (Signed)
Patient ID: Leonard Bentley, male   DOB: [birth year], 58 y.o.   MRN: [id number]   Leonard Bentley, is a 58 y.o. male  WPY:[id number]  KNL:[id number]  DOB - 1961-09-10  Subjective:  Chief Complaint and HPI: Leonard Bentley is a 58 y.o. male here today  for a follow up visit After hospitalization 1/20-1/21/2020.  Presents with palpitations, tremor, chest pressure.  Last drink was last night.  INR >8 here in office.  I was brought in emergently due to how the patient looked.  H/o PE.  On coumadin. No radiating pain. Felt acutely worse this afternoon.  L foot pain.  H/O DT  From discharge summary: Discharge Diagnoses:  Principal Problem:   Alcohol withdrawal ([place]) Active Problems:   Supratherapeutic INR   Recurrent pulmonary emboli (HCC)   Bleeding from left ear   History of non-Hodgkin's lymphoma   Cigarette smoker   Mitral regurgitation   Anxiety   S/P minimally-invasive mitral valve repair    HPI: Leonard Bentley a 58 y.o.malewith medical history significant ofrecurrent PE on Coumadin, alcohol abuse drinks a bottle of wine a [place], anxiety comes in because he scratches ear and it was bleeding. He continue to bleed and would not stop so therefore he came to the ED. He denies any bleeding from elsewhere. No melanotic stool no bright red blood per rectum no vomiting blood. He is on Coumadin he reports he takes his Coumadin as prescribed. His INR was checked and is over 10. While he was waiting in the ED however patient started to go through some mild alcohol withdrawal so was therefore referred for admission for alcohol withdrawal.  Hospital Course:  Acquired coagulopathy- h/o pulmonary emboli - INR > 10 on admission and now 6.19 after 5 mg of Vit K - of note, INR was > 10 on 1/1 at well when he was admitted to the hospital for a right thigh hematoma - states he last had his INR checked 3 weeks ago by Dr Redmond School and it was in the 2-3 range - have asked him to hold Coumadin for now and have INR  checked in 3 days by Dr Redmond School - recommend closer follow ups with PCP to check INR (maybe weekly for now) - he states he has had PEs twice in his life (2014 and 2017) and was told by Dr Julien Nordmann that he needed life long anticoagulation-  - he states DOACs were too expensive for him-   Alcohol withdrawal - has nearly resolved- will d/c with Librium taper  Left ear bleeding - has resolved- he still has a dried clot in the ear canal which I have advised him not to disturb for now and allow to fall off on its own  Hypotension with h/o HTN - holding Metoprolol for now- will need BP check on Friday and resumption of Metoprolol if needed  Anxiety - he is asking me for Xanax for his underlying anxiety- discussed risks of mixing Xanax with Alcohol -  I feel he needs to to start an SSIR or SNRI once his withdrawal resolves- should be followed by PCP  Nicotine abuse -21 mg  Nicotine patches prescribed- I have told the patient that further can be prescribed by PCP if he is serious about quitting smoking  ED/Hospital notes reviewed.   Social History:  No drugs, drinks alcohol daily  ROS:   Constitutional:  No f/c, No night sweats, No unexplained weight loss. EENT:  No vision changes, No blurry vision, No hearing changes.  No mouth, throat, or ear problems.  Respiratory: No cough, + SOB Cardiac: + CP, + palpitations GI:  No abd pain, No N/V/D. GU: No Urinary s/sx Musculoskeletal: No joint pain Neuro: No headache, + dizziness, no motor weakness.  Skin: No rash Endocrine:  No polydipsia. No polyuria.  Psych: Denies SI/HI  No problems updated.  ALLERGIES: Allergies  Allergen Reactions  . Other Other (See Comments)    "Cactus" blisters on tongue    PAST MEDICAL HISTORY: Past Medical History:  Diagnosis Date  . Acute pulmonary embolism ([place]) 04/11/2016  . Anxiety   . Arthritis   . Atrial tachycardia ([place]) 01/06/2016  . Depression   . DTs (delirium tremens) ([place])   . Dyspnea   .  ED (erectile dysfunction)   . ETOH abuse   . Hypertension   . Lymphoma, non Hodgkin's 09/25/2011   Stage 2  . Mitral regurgitation 01/20/2016  . Mobitz I 10/09/2015  . Murmur 01/06/2016  . Occasional tremors   . PAC (premature atrial contraction) 10/09/2015  . S/P minimally-invasive mitral valve repair 10/27/2016   Complex valvuloplasty including artificial Gore-tex neochord placement x6 and 34 mm Edwards Physio II ring annuloplasty via right mini thoracotomy approach    MEDICATIONS AT HOME: Prior to Admission medications   Medication Sig Start Date End Date Taking? Authorizing Provider  warfarin (COUMADIN) 7.5 MG tablet TAKE ONE TABLET BY MOUTH ONCE DAILY EXCEPT MONDAYS TAKE 7.5 MG (1 AND ONE HALF TABS). 10/20/18  Yes [provider]  acetaminophen (TYLENOL) 650 MG CR tablet Take 650 mg by mouth every 8 (eight) hours as needed for pain.    [provider]  chlordiazePOXIDE (LIBRIUM) 10 MG capsule Take 2 tab 4 x a [place] as needed for withdrawal symptoms for the next 2 days Decrease to 1 tab every 4 hrs as needed for the following 2 days Stop after 4 days of use. Patient not taking: Reported on 12/07/2018 11/28/18   Debbe Odea, MD  Diclofenac Sodium 1 % CREA Place 1 application onto the skin 2 (two) times daily. Patient not taking: Reported on 11/27/2018 11/13/18   [name], MD  nicotine (NICODERM CQ - DOSED IN MG/24 HOURS) 21 mg/24hr patch Place 1 patch (21 mg total) onto the skin daily. Your physician will need to prescribe subsequent patches Patient not taking: Reported on 12/07/2018 11/28/18   Debbe Odea, MD  tetrahydrozoline (REDNESS RELIEVER EYE DROPS) 0.05 % ophthalmic solution Place 1 drop into both eyes daily as needed (red eyes).     [provider]  thiamine 100 MG tablet Take 1 tablet (100 mg total) by mouth daily. Patient not taking: Reported on 12/07/2018 11/28/18   Debbe Odea, MD  triamcinolone cream (KENALOG) 0.1 % Apply 1 application topically  2 (two) times daily. Patient not taking: Reported on 12/07/2018 02/13/18   Carlena Hurl, PA-C     Objective:  EXAM:   Vitals:   12/07/18 1525  BP: 108/77  Pulse: (!) 135  Temp: 98.2 F (36.8 C)  TempSrc: Oral  SpO2: 97%    General appearance : A&OX3. NAD. Appears tremulous/anxious HEENT: Atraumatic and Normocephalic.  PERRLA. EOM intact.  Mouth-MMM, post pharynx WNL w/o erythema, No PND. Neck: supple, no JVD. No cervical lymphadenopathy. No thyromegaly Chest/Lungs:  Breathing-non-labored, Good air entry bilaterally, breath sounds normal without rales, rhonchi, or wheezing  CVS: increased rate, regular rhythm Abdomen: Bowel sounds present, Non tender and not distended with no gaurding, rigidity or rebound. Extremities: Bilateral Lower  Ext shows no edema, both legs are warm to touch with = pulse throughout Neurology:  CN II-XII grossly intact, Non focal.   Psych:  TP linear. J/I WNL. Normal speech. Appropriate eye contact and affect.  Skin:  No Rash  Data Review Lab Results  Component Value Date   HGBA1C 5.2 10/25/2016   HGBA1C 6.1 (H) 04/03/2016   HGBA1C 5.9 (H) 08/18/2015     Assessment & Plan   1. Palpitations EKG with sinus tachycardia, 1st degree A-V block-EMS called.  No ST changes  2. Personal history of pulmonary embolism - INR>8-risk of bleeding/ h/o PE-likely needs Vitamin K  3. Tremors of nervous system Concern may precipitate DT  4. Tachycardia Likely dehydrated  5. Dizziness  6. Hospital discharge follow-up Worsened and sent back to hospital today   Patient have been counseled extensively about nutrition and exercise  Return in about 2 weeks (around 12/21/2018) for [place]-hospital follow-up.  The patient was given clear instructions to go to ER or return to medical center if symptoms don't improve, worsen or new problems develop. The patient verbalized understanding. The patient was told to call to get lab results if they haven't heard  anything in the next week.     Freeman Caldron, PA-C [facility] and [place], [place]   12/07/2018, 4:06 PM

## 2018-12-07 ENCOUNTER — Inpatient Hospital Stay (HOSPITAL_COMMUNITY)
Admission: EM | Admit: 2018-12-07 | Discharge: 2018-12-10 | DRG: 603 | Disposition: A | Discharge status: Home / Self Care | Payer: Self-pay | Attending: Internal Medicine | Admitting: Internal Medicine

## 2018-12-07 ENCOUNTER — Encounter (HOSPITAL_COMMUNITY): Payer: Self-pay | Admitting: Emergency Medicine

## 2018-12-07 ENCOUNTER — Emergency Department (HOSPITAL_COMMUNITY): Payer: Self-pay

## 2018-12-07 ENCOUNTER — Ambulatory Visit: Payer: Self-pay | Attending: Family Medicine | Admitting: Physician Assistant

## 2018-12-07 ENCOUNTER — Other Ambulatory Visit: Payer: Self-pay

## 2018-12-07 VITALS — BP 108/77 | HR 135 | Temp 98.2°F

## 2018-12-07 DIAGNOSIS — F17210 Nicotine dependence, cigarettes, uncomplicated: Secondary | ICD-10-CM | POA: Diagnosis present

## 2018-12-07 DIAGNOSIS — F419 Anxiety disorder, unspecified: Secondary | ICD-10-CM | POA: Diagnosis present

## 2018-12-07 DIAGNOSIS — Z9221 Personal history of antineoplastic chemotherapy: Secondary | ICD-10-CM

## 2018-12-07 DIAGNOSIS — F10239 Alcohol dependence with withdrawal, unspecified: Secondary | ICD-10-CM | POA: Diagnosis present

## 2018-12-07 DIAGNOSIS — F10931 Alcohol use, unspecified with withdrawal delirium: Secondary | ICD-10-CM

## 2018-12-07 DIAGNOSIS — L03119 Cellulitis of unspecified part of limb: Secondary | ICD-10-CM

## 2018-12-07 DIAGNOSIS — Z09 Encounter for follow-up examination after completed treatment for conditions other than malignant neoplasm: Secondary | ICD-10-CM

## 2018-12-07 DIAGNOSIS — R000 Tachycardia, unspecified: Secondary | ICD-10-CM | POA: Insufficient documentation

## 2018-12-07 DIAGNOSIS — Z823 Family history of stroke: Secondary | ICD-10-CM

## 2018-12-07 DIAGNOSIS — Z8572 Personal history of non-Hodgkin lymphomas: Secondary | ICD-10-CM

## 2018-12-07 DIAGNOSIS — Z79899 Other long term (current) drug therapy: Secondary | ICD-10-CM

## 2018-12-07 DIAGNOSIS — Z808 Family history of malignant neoplasm of other organs or systems: Secondary | ICD-10-CM

## 2018-12-07 DIAGNOSIS — Z7901 Long term (current) use of anticoagulants: Secondary | ICD-10-CM

## 2018-12-07 DIAGNOSIS — Z86711 Personal history of pulmonary embolism: Secondary | ICD-10-CM

## 2018-12-07 DIAGNOSIS — F1010 Alcohol abuse, uncomplicated: Secondary | ICD-10-CM | POA: Diagnosis present

## 2018-12-07 DIAGNOSIS — I10 Essential (primary) hypertension: Secondary | ICD-10-CM | POA: Insufficient documentation

## 2018-12-07 DIAGNOSIS — R251 Tremor, unspecified: Secondary | ICD-10-CM | POA: Insufficient documentation

## 2018-12-07 DIAGNOSIS — H6122 Impacted cerumen, left ear: Secondary | ICD-10-CM | POA: Diagnosis present

## 2018-12-07 DIAGNOSIS — F10939 Alcohol use, unspecified with withdrawal, unspecified: Secondary | ICD-10-CM | POA: Diagnosis present

## 2018-12-07 DIAGNOSIS — F10231 Alcohol dependence with withdrawal delirium: Secondary | ICD-10-CM | POA: Diagnosis present

## 2018-12-07 DIAGNOSIS — M1990 Unspecified osteoarthritis, unspecified site: Secondary | ICD-10-CM | POA: Insufficient documentation

## 2018-12-07 DIAGNOSIS — I2699 Other pulmonary embolism without acute cor pulmonale: Secondary | ICD-10-CM | POA: Diagnosis present

## 2018-12-07 DIAGNOSIS — C8590 Non-Hodgkin lymphoma, unspecified, unspecified site: Secondary | ICD-10-CM | POA: Diagnosis present

## 2018-12-07 DIAGNOSIS — L03032 Cellulitis of left toe: Principal | ICD-10-CM | POA: Diagnosis present

## 2018-12-07 DIAGNOSIS — I440 Atrioventricular block, first degree: Secondary | ICD-10-CM | POA: Insufficient documentation

## 2018-12-07 DIAGNOSIS — F329 Major depressive disorder, single episode, unspecified: Secondary | ICD-10-CM | POA: Insufficient documentation

## 2018-12-07 DIAGNOSIS — R002 Palpitations: Secondary | ICD-10-CM | POA: Insufficient documentation

## 2018-12-07 DIAGNOSIS — Z8249 Family history of ischemic heart disease and other diseases of the circulatory system: Secondary | ICD-10-CM

## 2018-12-07 DIAGNOSIS — R791 Abnormal coagulation profile: Secondary | ICD-10-CM | POA: Diagnosis present

## 2018-12-07 DIAGNOSIS — L0390 Cellulitis, unspecified: Secondary | ICD-10-CM | POA: Diagnosis present

## 2018-12-07 DIAGNOSIS — Z800 Family history of malignant neoplasm of digestive organs: Secondary | ICD-10-CM

## 2018-12-07 DIAGNOSIS — R42 Dizziness and giddiness: Secondary | ICD-10-CM | POA: Insufficient documentation

## 2018-12-07 LAB — COMPREHENSIVE METABOLIC PANEL
ALT: 40 U/L (ref 0–44)
AST: 63 U/L — ABNORMAL HIGH (ref 15–41)
Albumin: 3.5 g/dL (ref 3.5–5.0)
Alkaline Phosphatase: 46 U/L (ref 38–126)
Anion gap: 12 (ref 5–15)
BUN: 9 mg/dL (ref 6–20)
CO2: 22 mmol/L (ref 22–32)
Calcium: 8.7 mg/dL — ABNORMAL LOW (ref 8.9–10.3)
Chloride: 102 mmol/L (ref 98–111)
Creatinine, Ser: 0.86 mg/dL (ref 0.61–1.24)
GFR calc Af Amer: >60 mL/min (ref >60)
GFR calc non Af Amer: >60 mL/min (ref >60)
Glucose, Bld: 83 mg/dL (ref 70–99)
Potassium: 4.2 mmol/L (ref 3.5–5.1)
Sodium: 136 mmol/L (ref 135–145)
Total Bilirubin: 0.8 mg/dL (ref 0.3–1.2)
Total Protein: 6.5 g/dL (ref 6.5–8.1)

## 2018-12-07 LAB — CBC
HCT: 36.7 % — ABNORMAL LOW (ref 39.0–52.0)
Hemoglobin: 12 g/dL — ABNORMAL LOW (ref 13.0–17.0)
MCH: 26.5 pg (ref 26.0–34.0)
MCHC: 32.7 g/dL (ref 30.0–36.0)
MCV: 81 fL (ref 80.0–100.0)
Platelets: 321 10*3/uL (ref 150–400)
RBC: 4.53 MIL/uL (ref 4.22–5.81)
RDW: 17.6 % — ABNORMAL HIGH (ref 11.5–15.5)
WBC: 4.5 10*3/uL (ref 4.0–10.5)
nRBC: 0.9 % — ABNORMAL HIGH (ref 0.0–0.2)

## 2018-12-07 LAB — PROTIME-INR
INR: 6.68
Prothrombin Time: 57.1 seconds — ABNORMAL HIGH (ref 11.4–15.2)

## 2018-12-07 LAB — LACTIC ACID, PLASMA: Lactic Acid, Venous: 1.8 mmol/L (ref 0.5–1.9)

## 2018-12-07 LAB — ETHANOL: Alcohol, Ethyl (B): <10 mg/dL (ref <10)

## 2018-12-07 LAB — POCT INR: INR: 8 — AB (ref 2.0–3.0)

## 2018-12-07 LAB — TROPONIN I: Troponin I: <0.03 ng/mL (ref <0.03)

## 2018-12-07 MED ORDER — THIAMINE HCL 100 MG/ML IJ SOLN
100.0000 mg | Freq: Every day | INTRAMUSCULAR | Status: DC
  Administered 2018-12-08: 100 mg via INTRAVENOUS
  Filled 2018-12-07: qty 2

## 2018-12-07 MED ORDER — SODIUM CHLORIDE 0.9 % IV BOLUS
1000.0000 mL | Freq: Once | INTRAVENOUS | Status: AC
  Administered 2018-12-07: 1000 mL via INTRAVENOUS

## 2018-12-07 MED ORDER — THIAMINE HCL 100 MG/ML IJ SOLN
Freq: Once | INTRAVENOUS | Status: AC
  Administered 2018-12-08: 01:00:00 via INTRAVENOUS
  Filled 2018-12-07: qty 1000

## 2018-12-07 MED ORDER — PIPERACILLIN-TAZOBACTAM 3.375 G IVPB 30 MIN
3.3750 g | Freq: Once | INTRAVENOUS | Status: AC
  Administered 2018-12-07: 3.375 g via INTRAVENOUS
  Filled 2018-12-07: qty 50

## 2018-12-07 MED ORDER — VANCOMYCIN HCL 10 G IV SOLR
1500.0000 mg | Freq: Once | INTRAVENOUS | Status: AC
  Administered 2018-12-07: 1500 mg via INTRAVENOUS
  Filled 2018-12-07: qty 1500

## 2018-12-07 MED ORDER — LORAZEPAM 2 MG/ML IJ SOLN
1.0000 mg | Freq: Four times a day (QID) | INTRAMUSCULAR | Status: DC | PRN
  Administered 2018-12-08 (×2): 1 mg via INTRAVENOUS
  Filled 2018-12-07 (×2): qty 1

## 2018-12-07 MED ORDER — LORAZEPAM 2 MG/ML IJ SOLN
0.0000 mg | Freq: Two times a day (BID) | INTRAMUSCULAR | Status: DC
  Administered 2018-12-10 (×2): 1 mg via INTRAVENOUS
  Filled 2018-12-07 (×2): qty 1

## 2018-12-07 MED ORDER — ACETAMINOPHEN 325 MG PO TABS
650.0000 mg | ORAL_TABLET | Freq: Three times a day (TID) | ORAL | Status: DC
  Administered 2018-12-08 (×3): 650 mg via ORAL
  Filled 2018-12-07 (×3): qty 2

## 2018-12-07 MED ORDER — LORAZEPAM 1 MG PO TABS: 1.0000 mg | ORAL_TABLET | Freq: Four times a day (QID) | ORAL | Status: DC | PRN

## 2018-12-07 MED ORDER — MORPHINE SULFATE (PF) 4 MG/ML IV SOLN
4.0000 mg | Freq: Once | INTRAVENOUS | Status: AC
  Administered 2018-12-07: 4 mg via INTRAVENOUS
  Filled 2018-12-07: qty 1

## 2018-12-07 MED ORDER — LORAZEPAM 2 MG/ML IJ SOLN
2.0000 mg | Freq: Once | INTRAMUSCULAR | Status: AC
  Administered 2018-12-07: 2 mg via INTRAVENOUS
  Filled 2018-12-07: qty 1

## 2018-12-07 MED ORDER — FOLIC ACID 1 MG PO TABS
1.0000 mg | ORAL_TABLET | Freq: Every day | ORAL | Status: DC
  Administered 2018-12-08 – 2018-12-10 (×3): 1 mg via ORAL
  Filled 2018-12-07 (×3): qty 1

## 2018-12-07 MED ORDER — ADULT MULTIVITAMIN W/MINERALS CH
1.0000 | ORAL_TABLET | Freq: Every day | ORAL | Status: DC
  Administered 2018-12-08 – 2018-12-10 (×3): 1 via ORAL
  Filled 2018-12-07 (×3): qty 1

## 2018-12-07 MED ORDER — VITAMIN B-1 100 MG PO TABS
100.0000 mg | ORAL_TABLET | Freq: Every day | ORAL | Status: DC
  Administered 2018-12-09 – 2018-12-10 (×2): 100 mg via ORAL
  Filled 2018-12-07 (×2): qty 1

## 2018-12-07 MED ORDER — LORAZEPAM 2 MG/ML IJ SOLN
1.0000 mg | Freq: Once | INTRAMUSCULAR | Status: AC
  Administered 2018-12-07: 1 mg via INTRAVENOUS
  Filled 2018-12-07: qty 1

## 2018-12-07 MED ORDER — LORAZEPAM 2 MG/ML IJ SOLN
0.0000 mg | Freq: Four times a day (QID) | INTRAMUSCULAR | Status: AC
  Administered 2018-12-08: 1 mg via INTRAVENOUS
  Administered 2018-12-09 (×2): 2 mg via INTRAVENOUS
  Administered 2018-12-09: 1 mg via INTRAVENOUS
  Filled 2018-12-07 (×5): qty 1

## 2018-12-07 NOTE — Patient Instructions (Signed)
Out via EMS today

## 2018-12-07 NOTE — ED Triage Notes (Signed)
Per [facility], Pt had valve replacement the end of last year. Pt reports central Chest tightness with no radiation starting around 2 pm. Pt reports feeling nauseous, short of breath and intermittent lightheadedness. Pt reports he drinks alcohol everyday 1L of wine a [place]. Pt reports his last drink was 16 hours ago. Pt received 324 ASA en route.

## 2018-12-07 NOTE — H&P (Signed)
History and Physical    Xaden Kaufman [place] JGG:[id number] DOB: November 10, 1960 DOA: 12/07/2018  PCP: [name], NP  Patient coming from: Home  Chief Complaint: Multiple including left great toe swelling and red feeling anxious and his heart racing, left index finger smashed in a car door, drainage from his ear  HPI: Rutherford Alarie Shaul is a 58 y.o. male with medical history significant of alcohol abuse, multiple PEs on Coumadin, lymphoma status post chemotherapy comes in with multiple complaints.  Patient found to have infection of his left great toe and tachycardic in the ED suspected to be withdrawing from alcohol.  He drinks daily.  He is a extremely poor historian and provides very vague answers.  He denies any other drug use.  He denies any fevers.  He denies any nausea vomiting or diarrhea.  He says he feels very anxious and he feels like his heart is racing.  He denies stopping any of his medications recently he is not on any cardiac medicines except Coumadin.  Patient is being referred for admission for his tachycardia which is suspected to be due to some alcohol withdrawal in addition to left toe cellulitis.  Is not a diabetic.  Review of Systems: As per HPI otherwise 10 point review of systems negative.   Past Medical History:  Diagnosis Date  . Acute pulmonary embolism ([place]) 04/11/2016  . Anxiety   . Arthritis   . Atrial tachycardia ([place]) 01/06/2016  . Depression   . DTs (delirium tremens) ([place])   . Dyspnea   . ED (erectile dysfunction)   . ETOH abuse   . Hypertension   . Lymphoma, non Hodgkin's 09/25/2011   Stage 2  . Mitral regurgitation 01/20/2016  . Mobitz I 10/09/2015  . Murmur 01/06/2016  . Occasional tremors   . PAC (premature atrial contraction) 10/09/2015  . S/P minimally-invasive mitral valve repair 10/27/2016   Complex valvuloplasty including artificial Gore-tex neochord placement x6 and 34 mm Edwards Physio II ring annuloplasty via right mini thoracotomy approach    Past  Surgical History:  Procedure Laterality Date  . CARDIAC CATHETERIZATION N/A 08/13/2016   Procedure: Right/Left Heart Cath and Coronary Angiography;  Surgeon: [name], MD;  Location: [facility];  Service: Cardiovascular;  Laterality: N/A;  . MITRAL VALVE REPAIR Right 10/27/2016   Procedure: MINIMALLY INVASIVE MITRAL VALVE REPAIR (MVR) USING 34 PHYSIO II ANNULOPLASTY RING;  Surgeon: [name], MD;  Location: [facility];  Service: Open Heart Surgery;  Laterality: Right;  . SKIN BIOPSY Right 04/04/2018   right mid medial anterior tibial shave  see report in chart  . TEE WITHOUT CARDIOVERSION N/A 02/11/2016   Procedure: TRANSESOPHAGEAL ECHOCARDIOGRAM (TEE);  Surgeon: [name], MD;  Location: [facility];  Service: Cardiovascular;  Laterality: N/A;  . TEE WITHOUT CARDIOVERSION N/A 10/27/2016   Procedure: TRANSESOPHAGEAL ECHOCARDIOGRAM (TEE);  Surgeon: [name], MD;  Location: [facility];  Service: Open Heart Surgery;  Laterality: N/A;  . TRIGGER FINGER RELEASE Bilateral      reports that he has been smoking cigarettes and e-cigarettes. He has a 40.00 pack-year smoking history. He quit smokeless tobacco use about 35 years ago. He reports current alcohol use. He reports that he does not use drugs.  Allergies  Allergen Reactions  . Other Other (See Comments)    "Cactus" blisters on tongue    Family History  Problem Relation Age of Onset  . Cancer Mother        BREAST(BONE)  .  Cancer Father        PANCREATIC  . Hypertension Maternal Grandmother   . Stroke Maternal Aunt   . Heart attack Neg Hx     Prior to Admission medications   Medication Sig Start Date End Date Taking? Authorizing Provider  acetaminophen (TYLENOL) 650 MG CR tablet Take 650 mg by mouth every 8 (eight) hours as needed for pain.   Yes [provider]  warfarin (COUMADIN) 5 MG tablet Take 2.5-5 mg by mouth See admin instructions. Take 1/2 tablet by mouth every Sun Take 1 tablet by mouth  all other days   Yes [provider]  chlordiazePOXIDE (LIBRIUM) 10 MG capsule Take 2 tab 4 x a [place] as needed for withdrawal symptoms for the next 2 days Decrease to 1 tab every 4 hrs as needed for the following 2 days Stop after 4 days of use. Patient not taking: Reported on 12/07/2018 11/28/18   Debbe Odea, MD  Diclofenac Sodium 1 % CREA Place 1 application onto the skin 2 (two) times daily. Patient not taking: Reported on 11/27/2018 11/13/18   [name], MD  nicotine (NICODERM CQ - DOSED IN MG/24 HOURS) 21 mg/24hr patch Place 1 patch (21 mg total) onto the skin daily. Your physician will need to prescribe subsequent patches Patient not taking: Reported on 12/07/2018 11/28/18   Debbe Odea, MD  thiamine 100 MG tablet Take 1 tablet (100 mg total) by mouth daily. Patient not taking: Reported on 12/07/2018 11/28/18   Debbe Odea, MD  triamcinolone cream (KENALOG) 0.1 % Apply 1 application topically 2 (two) times daily. Patient not taking: Reported on 12/07/2018 02/13/18   Carlena Hurl, PA-C    Physical Exam: Vitals:   12/07/18 2045 12/07/18 2100 12/07/18 2115 12/07/18 2218  BP: 121/90 121/85 122/87 105/86  Pulse: (!) 128 (!) 127 (!) 129 (!) 128  Resp: 17 (!) 21 18   Temp:      TempSrc:      SpO2: 97% 96% 97%       Constitutional: NAD, calm, comfortable mildly tremulous Vitals:   12/07/18 2045 12/07/18 2100 12/07/18 2115 12/07/18 2218  BP: 121/90 121/85 122/87 105/86  Pulse: (!) 128 (!) 127 (!) 129 (!) 128  Resp: 17 (!) 21 18   Temp:      TempSrc:      SpO2: 97% 96% 97%    Eyes: PERRL, lids and conjunctivae normal ENMT: Mucous membranes are moist. Posterior pharynx clear of any exudate or lesions.Normal dentition.  Neck: normal, supple, no masses, no thyromegaly Respiratory: clear to auscultation bilaterally, no wheezing, no crackles. Normal respiratory effort. No accessory muscle use.  Cardiovascular: Tacky rate and regular rhythm, no murmurs / rubs / gallops.  No extremity edema. 2+ pedal pulses. No carotid bruits.  Abdomen: no tenderness, no masses palpated. No hepatosplenomegaly. Bowel sounds positive.  Musculoskeletal: no clubbing / cyanosis. No joint deformity upper and lower extremities. Good ROM, no contractures. Normal muscle tone.  Skin: no rashes, lesions, ulcers. No induration except left great toe swollen and red with no drainage Neurologic: CN 2-12 grossly intact. Sensation intact, DTR normal. Strength 5/5 in all 4.  Psychiatric: Normal judgment and insight. Alert and oriented x 3. Normal mood.    Labs on Admission: I have personally reviewed following labs and imaging studies  CBC: Recent Labs  Lab 12/07/18 1644  WBC 4.5  HGB 12.0*  HCT 36.7*  MCV 81.0  PLT 836   Basic Metabolic Panel: Recent Labs  Lab 12/07/18 1910  NA 136  K 4.2  CL 102  CO2 22  GLUCOSE 83  BUN 9  CREATININE 0.86  CALCIUM 8.7*   GFR: Estimated Creatinine Clearance: 100.6 mL/min (by C-G formula based on SCr of 0.86 mg/dL). Liver Function Tests: Recent Labs  Lab 12/07/18 1910  AST 63*  ALT 40  ALKPHOS 46  BILITOT 0.8  PROT 6.5  ALBUMIN 3.5   No results for input(s): LIPASE, AMYLASE in the last 168 hours. No results for input(s): AMMONIA in the last 168 hours. Coagulation Profile: Recent Labs  Lab 12/07/18 1603 12/07/18 1910  INR 8.0* 6.68*   Cardiac Enzymes: Recent Labs  Lab 12/07/18 1910  TROPONINI <0.03   BNP (last 3 results) No results for input(s): PROBNP in the last 8760 hours. HbA1C: No results for input(s): HGBA1C in the last 72 hours. CBG: No results for input(s): GLUCAP in the last 168 hours. Lipid Profile: No results for input(s): CHOL, HDL, LDLCALC, TRIG, CHOLHDL, LDLDIRECT in the last 72 hours. Thyroid Function Tests: No results for input(s): TSH, T4TOTAL, FREET4, T3FREE, THYROIDAB in the last 72 hours. Anemia Panel: No results for input(s): VITAMINB12, FOLATE, FERRITIN, TIBC, IRON, RETICCTPCT in the last 72  hours. Urine analysis:    Component Value Date/Time   COLORURINE RED (A) 11/01/2018 2149   APPEARANCEUR TURBID (A) 11/01/2018 2149   LABSPEC 1.018 11/01/2018 2149   PHURINE 7.0 11/01/2018 2149   GLUCOSEU NEGATIVE 11/01/2018 2149   HGBUR MODERATE (A) 11/01/2018 2149   BILIRUBINUR NEGATIVE 11/01/2018 2149   [place] NEGATIVE 11/01/2018 2149   PROTEINUR 100 (A) 11/01/2018 2149   UROBILINOGEN 1.0 01/25/2013 0825   NITRITE NEGATIVE 11/01/2018 2149   LEUKOCYTESUR NEGATIVE 11/01/2018 2149   Sepsis Labs: !!!!!!!!!!!!!!!!!!!!!!!!!!!!!!!!!!!!!!!!!!!! @LABRCNTIP (procalcitonin:4,lacticidven:4) )No results found for this or any previous visit (from the past 240 hour(s)).   Radiological Exams on Admission: Dg Chest 2 View  Result Date: 12/07/2018 CLINICAL DATA:  Great toe infection.  Shortness of breath. EXAM: CHEST - 2 VIEW COMPARISON:  11/01/2018 FINDINGS: Prior mitral valve replacement. Heart is upper limits normal in size. Continued reticulonodular densities project over the mid and lower lungs, right greater than left as shown on prior study and CT. No confluent airspace opacities or effusions. No acute bony abnormality. IMPRESSION: Stable chronic reticulonodular densities in the lungs, right greater than left, favor chronic infectious process or inflammation. No acute cardiopulmonary disease. Electronically Signed   By: Rolm Baptise M.D.   On: 12/07/2018 18:09   Dg Foot Complete Left  Result Date: 12/07/2018 CLINICAL DATA:  Great toe pain. EXAM: LEFT FOOT - COMPLETE 3+ VIEW COMPARISON:  None. FINDINGS: There is no evidence of fracture or dislocation. There is no evidence of arthropathy or other focal bone abnormality. Soft tissues are unremarkable. IMPRESSION: Negative. Electronically Signed   By: Rolm Baptise M.D.   On: 12/07/2018 18:10    EKG: Independently reviewed.  Sinus tachycardia Old chart reviewed Case discussed with Dr. Venora Maples in the ED Chest x-ray reviewed no edema or  infiltrate  Assessment/Plan 58 year old male with left lower extremity cellulitis likely early alcohol withdrawal Principal Problem:   Cellulitis-patient not septic.  Lactic acid normal.  Patient IV Vanco and Zosyn.  Likely will improve in the next 24 to 48 hours.  Active Problems:   Alcohol withdrawal (HCC)-placed on CIWA protocol.  Give banana bag.  Monitor closely for worsening alcohol withdrawal.    Supratherapeutic INR-Coumadin over 6.  Patient reports he is taking Coumadin again.  Will  allow to trend down on its own in the setting of no evidence of active bleeding from anywhere.    History of non-Hodgkin's lymphoma-noted    Alcohol abuse-CIWA as above    Anxiety-noted Ativan will help with this    Recurrent pulmonary emboli (HCC)-INR over 6 holding Coumadin at this time    DVT prophylaxis: INR over 6 Code Status: Full Family Communication: None Disposition Plan: 1 to 2 days Consults called: None Admission status: Admission   Pacer Dorn A MD [facility]  If 7PM-7AM, please contact night-coverage [email or web address] Password [email or web address]  12/07/2018, 10:50 PM

## 2018-12-07 NOTE — ED Notes (Signed)
Recollected blue and light green and sent to lab

## 2018-12-07 NOTE — ED Provider Notes (Signed)
[facility] EMERGENCY DEPARTMENT Provider Note   CSN: [id number] Arrival date & time: 12/07/18  1627     History   Chief Complaint Chief Complaint  Patient presents with  . Chest Pain    HPI Leonard Bentley is a 58 y.o. male.  HPI Patient is a 58 year old male who presents to the emergency department with complaints of increasing left great toe and left foot pain over the past 4 to 5 days.  He states it is painful and presents with a erythematous warm and swollen left great toe.  He also reports generalized joint pain and transient chest tightness without radiation.  His chest tightness began around 2 PM.  He had some nausea today and lightheadedness.  He drinks wine every Miah.  He reports his last drink was 17 hours ago.  He presents tachycardic and tremulous.  He was given aspirin by EMS.  He does have a history of pulmonary embolism for which he takes Coumadin.  He was recently hospitalized for alcohol withdrawal and supratherapeutic INR with an INR greater than 9.  He denies bleeding.  Denies melena or hematochezia.  Chills without documented fever.  Symptoms are moderate in severity.  No active chest pain at this time   Past Medical History:  Diagnosis Date  . Acute pulmonary embolism ([place]) 04/11/2016  . Anxiety   . Arthritis   . Atrial tachycardia ([place]) 01/06/2016  . Depression   . DTs (delirium tremens) ([place])   . Dyspnea   . ED (erectile dysfunction)   . ETOH abuse   . Hypertension   . Lymphoma, non Hodgkin's 09/25/2011   Stage 2  . Mitral regurgitation 01/20/2016  . Mobitz I 10/09/2015  . Murmur 01/06/2016  . Occasional tremors   . PAC (premature atrial contraction) 10/09/2015  . S/P minimally-invasive mitral valve repair 10/27/2016   Complex valvuloplasty including artificial Gore-tex neochord placement x6 and 34 mm Edwards Physio II ring annuloplasty via right mini thoracotomy approach    Patient Active Problem List   Diagnosis Date Noted  . Cellulitis  12/07/2018  . Bleeding from left ear 11/28/2018  . HTN (hypertension) 11/02/2018  . UTI (urinary tract infection) 11/02/2018  . Ecchymosis   . Tachyarrhythmia 08/30/2018  . Hypomagnesemia 08/30/2018  . Recurrent pulmonary emboli ([place])   . Hematuria   . Delirium tremens ([place])   . LFTs abnormal   . Alcoholic hepatitis without ascites   . Alcohol withdrawal ([place]) 06/04/2018  . Dizziness   . Posterior tibial tendon dysfunction 02/21/2018  . Supratherapeutic INR 11/27/2016  . Dehydration 11/27/2016  . SVT (supraventricular tachycardia) ([place])   . S/P minimally-invasive mitral valve repair 10/27/2016  . History of pulmonary embolus ([place]) 04/11/2016  . Left sided numbness   . Anxiety 04/02/2016  . Panic attacks 04/02/2016  . Mitral valve prolapse   . Mitral regurgitation 01/20/2016  . Atrial tachycardia ([place]) 01/06/2016  . Murmur 01/06/2016  . Mobitz I 10/09/2015  . PAC (premature atrial contraction) 10/09/2015  . Arthritis 08/14/2015  . Neuropathy due to chemotherapeutic drug ([place]) 03/09/2013  . Leucopenia 01/31/2013  . Personal history of pulmonary embolism 12/28/2012  . Alcohol abuse 08/24/2012  . Cigarette smoker 08/24/2012  . Hemorrhoid 03/08/2012  . Anemia 12/10/2011  . History of non-Hodgkin's lymphoma 09/25/2011    Past Surgical History:  Procedure Laterality Date  . CARDIAC CATHETERIZATION N/A 08/13/2016   Procedure: Right/Left Heart Cath and Coronary Angiography;  Surgeon: [name], MD;  Location: [facility]  CV LAB;  Service: Cardiovascular;  Laterality: N/A;  . MITRAL VALVE REPAIR Right 10/27/2016   Procedure: MINIMALLY INVASIVE MITRAL VALVE REPAIR (MVR) USING 34 PHYSIO II ANNULOPLASTY RING;  Surgeon: [name], MD;  Location: [facility];  Service: Open Heart Surgery;  Laterality: Right;  . SKIN BIOPSY Right 04/04/2018   right mid medial anterior tibial shave  see report in chart  . TEE WITHOUT CARDIOVERSION N/A 02/11/2016   Procedure: TRANSESOPHAGEAL  ECHOCARDIOGRAM (TEE);  Surgeon: [name], MD;  Location: [facility];  Service: Cardiovascular;  Laterality: N/A;  . TEE WITHOUT CARDIOVERSION N/A 10/27/2016   Procedure: TRANSESOPHAGEAL ECHOCARDIOGRAM (TEE);  Surgeon: [name], MD;  Location: [facility];  Service: Open Heart Surgery;  Laterality: N/A;  . TRIGGER FINGER RELEASE Bilateral         Home Medications    Prior to Admission medications   Medication Sig Start Date End Date Taking? Authorizing Provider  acetaminophen (TYLENOL) 650 MG CR tablet Take 650 mg by mouth every 8 (eight) hours as needed for pain.   Yes [provider]  warfarin (COUMADIN) 5 MG tablet Take 2.5-5 mg by mouth See admin instructions. Take 1/2 tablet by mouth every Sun Take 1 tablet by mouth all other days   Yes [provider]  chlordiazePOXIDE (LIBRIUM) 10 MG capsule Take 2 tab 4 x a [place] as needed for withdrawal symptoms for the next 2 days Decrease to 1 tab every 4 hrs as needed for the following 2 days Stop after 4 days of use. Patient not taking: Reported on 12/07/2018 11/28/18   Debbe Odea, MD  Diclofenac Sodium 1 % CREA Place 1 application onto the skin 2 (two) times daily. Patient not taking: Reported on 11/27/2018 11/13/18   [name], MD  nicotine (NICODERM CQ - DOSED IN MG/24 HOURS) 21 mg/24hr patch Place 1 patch (21 mg total) onto the skin daily. Your physician will need to prescribe subsequent patches Patient not taking: Reported on 12/07/2018 11/28/18   Debbe Odea, MD  thiamine 100 MG tablet Take 1 tablet (100 mg total) by mouth daily. Patient not taking: Reported on 12/07/2018 11/28/18   Debbe Odea, MD  triamcinolone cream (KENALOG) 0.1 % Apply 1 application topically 2 (two) times daily. Patient not taking: Reported on 12/07/2018 02/13/18   Tysinger, Camelia Eng, PA-C    Family History Family History  Problem Relation Age of Onset  . Cancer Mother        BREAST(BONE)  . Cancer Father        PANCREATIC  .  Hypertension Maternal Grandmother   . Stroke Maternal Aunt   . Heart attack Neg Hx     Social History Social History   Tobacco Use  . Smoking status: Current Every [place] Smoker    Packs/Ruttan: 1.00    Years: 40.00    Pack years: 40.00    Types: Cigarettes, E-cigarettes  . Smokeless tobacco: Former Systems developer    Quit date: 1985  . Tobacco comment: vapes all [place]  Substance Use Topics  . Alcohol use: Yes    Alcohol/week: 0.0 standard drinks    Comment: 1-2 bottles of red wine a [place]  . Drug use: No     Allergies   Other   Review of Systems Review of Systems  All other systems reviewed and are negative.    Physical Exam Updated Vital Signs BP 105/86   Pulse (!) 128   Temp 98.5 F (36.9 C) (Oral)   Resp  18   SpO2 97%   Physical Exam Vitals signs and nursing note reviewed.  Constitutional:      Appearance: He is well-developed.  HENT:     Head: Normocephalic and atraumatic.  Neck:     Musculoskeletal: Normal range of motion.  Cardiovascular:     Rate and Rhythm: Regular rhythm. Tachycardia present.     Heart sounds: Normal heart sounds.  Pulmonary:     Effort: Pulmonary effort is normal. No respiratory distress.     Breath sounds: Normal breath sounds.  Abdominal:     General: There is no distension.     Palpations: Abdomen is soft.     Tenderness: There is no abdominal tenderness.  Musculoskeletal: Normal range of motion.     Comments: Erythema and swelling of the left great toe with erythema warmth and swelling extending onto the dorsum of his left foot.  Skin:    General: Skin is warm and dry.  Neurological:     Mental Status: He is alert and oriented to person, place, and time.     Comments: Tremulous  Psychiatric:        Judgment: Judgment normal.      ED Treatments / Results  Labs (all labs ordered are listed, but only abnormal results are displayed) Labs Reviewed  CBC - Abnormal; Notable for the following components:      Result Value    Hemoglobin 12.0 (*)    HCT 36.7 (*)    RDW 17.6 (*)    nRBC 0.9 (*)    All other components within normal limits  COMPREHENSIVE METABOLIC PANEL - Abnormal; Notable for the following components:   Calcium 8.7 (*)    AST 63 (*)    All other components within normal limits  PROTIME-INR - Abnormal; Notable for the following components:   Prothrombin Time 57.1 (*)    INR 6.68 (*)    All other components within normal limits  CULTURE, BLOOD (ROUTINE X 2)  CULTURE, BLOOD (ROUTINE X 2)  ETHANOL  LACTIC ACID, PLASMA  TROPONIN I    EKG None  Radiology Dg Chest 2 View  Result Date: 12/07/2018 CLINICAL DATA:  Great toe infection.  Shortness of breath. EXAM: CHEST - 2 VIEW COMPARISON:  11/01/2018 FINDINGS: Prior mitral valve replacement. Heart is upper limits normal in size. Continued reticulonodular densities project over the mid and lower lungs, right greater than left as shown on prior study and CT. No confluent airspace opacities or effusions. No acute bony abnormality. IMPRESSION: Stable chronic reticulonodular densities in the lungs, right greater than left, favor chronic infectious process or inflammation. No acute cardiopulmonary disease. Electronically Signed   By: Rolm Baptise M.D.   On: 12/07/2018 18:09   Dg Foot Complete Left  Result Date: 12/07/2018 CLINICAL DATA:  Great toe pain. EXAM: LEFT FOOT - COMPLETE 3+ VIEW COMPARISON:  None. FINDINGS: There is no evidence of fracture or dislocation. There is no evidence of arthropathy or other focal bone abnormality. Soft tissues are unremarkable. IMPRESSION: Negative. Electronically Signed   By: Rolm Baptise M.D.   On: 12/07/2018 18:10    Procedures .Critical Care Performed by: Jola Schmidt, MD Authorized by: Jola Schmidt, MD   Critical care provider statement:    Critical care time (minutes):  32   Critical care was time spent personally by me on the following activities:  Discussions with consultants, evaluation of patient's  response to treatment, examination of patient, ordering and performing treatments and interventions, ordering and  review of laboratory studies, ordering and review of radiographic studies, pulse oximetry, re-evaluation of patient's condition, obtaining history from patient or surrogate and review of old charts   (including critical care time)  Medications Ordered in ED Medications  sodium chloride 0.9 % bolus 1,000 mL (0 mLs Intravenous Stopped 12/07/18 1910)  LORazepam (ATIVAN) injection 1 mg (1 mg Intravenous Given 12/07/18 1716)  piperacillin-tazobactam (ZOSYN) IVPB 3.375 g (0 g Intravenous Stopped 12/07/18 1807)  vancomycin (VANCOCIN) 1,500 mg in sodium chloride 0.9 % 500 mL IVPB (0 mg Intravenous Stopped 12/07/18 2116)  morphine 4 MG/ML injection 4 mg (4 mg Intravenous Given 12/07/18 2042)  LORazepam (ATIVAN) injection 2 mg (2 mg Intravenous Given 12/07/18 2218)  sodium chloride 0.9 % bolus 1,000 mL (1,000 mLs Intravenous New Bag/Given 12/07/18 2215)     Initial Impression / Assessment and Plan / ED Course  I have reviewed the triage vital signs and the nursing notes.  Pertinent labs & imaging results that were available during my care of the patient were reviewed by me and considered in my medical decision making (see chart for details).     Presentation concerning for developing and worsening cellulitis of the left great toe extending up to the left foot.  He is tachycardic on arrival to emergency department but much of this I think is more related to alcohol withdrawal.  His heart rate improved initially with IV fluids and a dose of Ativan and now he is tachycardic again.  He continues to have tremor.  Additional IV Ativan and initiation of CIWA protocol.  Additional IV fluids at this time.  I am not convinced that his tachycardia is secondary to sepsis given the reassuring lactate. Blood cultures to be obtained. Will need hospitalization and CIWA protocol. Pt to managed in stepdown  Final  Clinical Impressions(s) / ED Diagnoses   Final diagnoses:  Cellulitis of great toe of left foot  Alcohol withdrawal delirium, acute, hyperactive [facility])    ED Discharge Orders    None       Jola Schmidt, MD 12/07/18 2246

## 2018-12-07 NOTE — ED Notes (Signed)
INR results given to Dr. Venora Maples

## 2018-12-08 DIAGNOSIS — F10231 Alcohol dependence with withdrawal delirium: Secondary | ICD-10-CM

## 2018-12-08 LAB — BLOOD CULTURE ID PANEL (REFLEXED)

## 2018-12-08 LAB — BASIC METABOLIC PANEL
Anion gap: 8 (ref 5–15)
BUN: 10 mg/dL (ref 6–20)
CO2: 23 mmol/L (ref 22–32)
Calcium: 8.5 mg/dL — ABNORMAL LOW (ref 8.9–10.3)
Chloride: 109 mmol/L (ref 98–111)
Creatinine, Ser: 0.89 mg/dL (ref 0.61–1.24)
GFR calc Af Amer: >60 mL/min (ref >60)
GFR calc non Af Amer: >60 mL/min (ref >60)
Glucose, Bld: 91 mg/dL (ref 70–99)
Potassium: 3.9 mmol/L (ref 3.5–5.1)
Sodium: 140 mmol/L (ref 135–145)

## 2018-12-08 LAB — CBC
HCT: 32.3 % — ABNORMAL LOW (ref 39.0–52.0)
Hemoglobin: 10.1 g/dL — ABNORMAL LOW (ref 13.0–17.0)
MCH: 26.2 pg (ref 26.0–34.0)
MCHC: 31.3 g/dL (ref 30.0–36.0)
MCV: 83.7 fL (ref 80.0–100.0)
Platelets: 179 10*3/uL (ref 150–400)
RBC: 3.86 MIL/uL — ABNORMAL LOW (ref 4.22–5.81)
RDW: 17.3 % — ABNORMAL HIGH (ref 11.5–15.5)
WBC: 2.5 10*3/uL — ABNORMAL LOW (ref 4.0–10.5)
nRBC: 1.6 % — ABNORMAL HIGH (ref 0.0–0.2)

## 2018-12-08 LAB — PROTIME-INR
INR: 4.07
Prothrombin Time: 38.9 seconds — ABNORMAL HIGH (ref 11.4–15.2)

## 2018-12-08 MED ORDER — NICOTINE 14 MG/24HR TD PT24
14.0000 mg | MEDICATED_PATCH | Freq: Every day | TRANSDERMAL | Status: DC
  Administered 2018-12-08 – 2018-12-09 (×2): 14 mg via TRANSDERMAL
  Filled 2018-12-08 (×2): qty 1

## 2018-12-08 MED ORDER — PIPERACILLIN-TAZOBACTAM 3.375 G IVPB
3.3750 g | Freq: Three times a day (TID) | INTRAVENOUS | Status: DC
  Administered 2018-12-08 (×2): 3.375 g via INTRAVENOUS
  Filled 2018-12-08 (×2): qty 50

## 2018-12-08 MED ORDER — ACETAMINOPHEN 325 MG PO TABS
650.0000 mg | ORAL_TABLET | Freq: Four times a day (QID) | ORAL | Status: DC | PRN
  Administered 2018-12-10: 650 mg via ORAL
  Filled 2018-12-08: qty 2

## 2018-12-08 MED ORDER — CARBAMIDE PEROXIDE 6.5 % OT SOLN
5.0000 [drp] | Freq: Two times a day (BID) | OTIC | Status: DC
  Administered 2018-12-08 – 2018-12-10 (×4): 5 [drp] via OTIC
  Filled 2018-12-08: qty 15

## 2018-12-08 MED ORDER — VANCOMYCIN HCL 10 G IV SOLR
1250.0000 mg | Freq: Two times a day (BID) | INTRAVENOUS | Status: DC
  Administered 2018-12-08: 1250 mg via INTRAVENOUS
  Filled 2018-12-08 (×2): qty 1250

## 2018-12-08 MED ORDER — TRAMADOL HCL 50 MG PO TABS: 50.0000 mg | ORAL_TABLET | Freq: Four times a day (QID) | ORAL | Status: DC | PRN

## 2018-12-08 MED ORDER — CEFAZOLIN SODIUM-DEXTROSE 1-4 GM/50ML-% IV SOLN
1.0000 g | Freq: Three times a day (TID) | INTRAVENOUS | Status: DC
  Administered 2018-12-08 – 2018-12-10 (×6): 1 g via INTRAVENOUS
  Filled 2018-12-08 (×7): qty 50

## 2018-12-08 MED ORDER — CLONIDINE HCL 0.1 MG PO TABS
0.1000 mg | ORAL_TABLET | Freq: Three times a day (TID) | ORAL | Status: DC
  Administered 2018-12-08 – 2018-12-10 (×6): 0.1 mg via ORAL
  Filled 2018-12-08 (×6): qty 1

## 2018-12-08 NOTE — ED Notes (Signed)
Attempted report x1. 

## 2018-12-08 NOTE — Progress Notes (Signed)
PHARMACY - PHYSICIAN COMMUNICATION CRITICAL VALUE ALERT - BLOOD CULTURE IDENTIFICATION (BCID)  Leonard Bentley is an 58 y.o. male who presented to [facility] on 12/07/2018 with a chief complaint of multiple complaints.  Assessment:  58 yo M presents with multiple complaints. Has L great toe swelling and cellulitis. Blood cx is 2/4 with staph species. Probable contaminant.   Name of physician (or Provider) Contacted: Sheral Flow  Current antibiotics: Vancomycin and Zosyn  Changes to prescribed antibiotics recommended:  Depending on cellulitis, could consider stopping Zosyn and vancomycin and just starting cefazolin if found to be non-purulent cellulitis Otherwise could consider vancomycin and cefazolin combination for now  Results for orders placed or performed during the hospital encounter of 12/07/18  Blood Culture ID Panel (Reflexed) (Collected: 12/07/2018  5:09 PM)  Result Value Ref Range   Enterococcus species NOT DETECTED NOT DETECTED   Listeria monocytogenes NOT DETECTED NOT DETECTED   Staphylococcus species DETECTED (A) NOT DETECTED   Staphylococcus aureus (BCID) NOT DETECTED NOT DETECTED   Methicillin resistance DETECTED (A) NOT DETECTED   Streptococcus species NOT DETECTED NOT DETECTED   Streptococcus agalactiae NOT DETECTED NOT DETECTED   Streptococcus pneumoniae NOT DETECTED NOT DETECTED   Streptococcus pyogenes NOT DETECTED NOT DETECTED   Acinetobacter baumannii NOT DETECTED NOT DETECTED   Enterobacteriaceae species NOT DETECTED NOT DETECTED   Enterobacter cloacae complex NOT DETECTED NOT DETECTED   Escherichia coli NOT DETECTED NOT DETECTED   Klebsiella oxytoca NOT DETECTED NOT DETECTED   Klebsiella pneumoniae NOT DETECTED NOT DETECTED   Proteus species NOT DETECTED NOT DETECTED   Serratia marcescens NOT DETECTED NOT DETECTED   Haemophilus influenzae NOT DETECTED NOT DETECTED   Neisseria meningitidis NOT DETECTED NOT DETECTED   Pseudomonas aeruginosa NOT DETECTED NOT  DETECTED   Candida albicans NOT DETECTED NOT DETECTED   Candida glabrata NOT DETECTED NOT DETECTED   Candida krusei NOT DETECTED NOT DETECTED   Candida parapsilosis NOT DETECTED NOT DETECTED   Candida tropicalis NOT DETECTED NOT DETECTED    Kennadie Brenner J 12/08/2018  10:56 AM

## 2018-12-08 NOTE — Progress Notes (Signed)
Pharmacy Antibiotic Note  Leonard Bentley is a 58 y.o. male admitted on 12/07/2018 with cellulitis.  Pharmacy has been consulted for Vancomycin/Zosyn dosing. WBC WNL. Renal function OK.   Plan: Vancomycin 1250 mg IV q12h >>Esimated AUC: 498 Zosyn 3.375G IV q8h to be infused over 4 hours Trend WBC, temp, renal function  F/U infectious work-up Drug levels as indicated  Temp (24hrs), Avg:98.4 F (36.9 C), Min:98.2 F (36.8 C), Max:98.5 F (36.9 C)  Recent Labs  Lab 12/07/18 1644 12/07/18 1718 12/07/18 1910  WBC 4.5  --   --   CREATININE  --   --  0.86  LATICACIDVEN  --  1.8  --     Estimated Creatinine Clearance: 100.6 mL/min (by C-G formula based on SCr of 0.86 mg/dL).    Allergies  Allergen Reactions  . Other Other (See Comments)    "Cactus" blisters on tongue    Narda Bonds 12/08/2018 1:23 AM

## 2018-12-08 NOTE — ED Notes (Signed)
ED TO INPATIENT HANDOFF REPORT  Name/Age/Gender Leonard Bentley 58 y.o. male  Code Status    Code Status Orders  (From admission, onward)         Start     Ordered   12/07/18 2252  Full code  Continuous     12/07/18 2252        Code Status History    Date Active Date Inactive Code Status Order ID Comments User Context   11/27/2018 0528 11/28/2018 1548 Full Code [id number]  [name], MD ED   11/02/2018 0135 11/08/2018 1511 Full Code [id number]  [name], MD ED   11/02/2018 0053 11/02/2018 0135 Full Code [id number]  [name], MD ED   08/30/2018 2305 09/01/2018 0042 Full Code [id number]  [name], MD Inpatient   08/30/2018 1620 08/30/2018 2305 Full Code [id number]  [name], PA-C ED   07/25/2018 1557 07/30/2018 1714 Full Code [id number]  [name], MD ED   06/04/2018 2302 06/13/2018 0204 Full Code [id number]  [name], MD Inpatient   11/27/2016 2247 11/28/2016 1715 Full Code [id number]  [name], MD Inpatient   10/27/2016 1420 10/31/2016 1519 Full Code [id number]  [name], MD Inpatient   08/13/2016 0855 08/13/2016 1559 Full Code [id number]  [name], MD Inpatient   04/02/2016 1736 04/03/2016 1820 Full Code [id number]  [name], NP Inpatient   01/31/2013 0615 02/02/2013 1800 Full Code [id number]  [name], MD Inpatient   01/30/2013 2133 01/31/2013 0615 Full Code [id number]  [name], MD ED   12/28/2012 1235 01/01/2013 1336 Full Code [id number]  [name], MD ED   08/24/2012 0423 08/25/2012 1426 Full Code [id number]  [name]., DO ED      Home/SNF/Other Home  Chief Complaint cp  Level of Care/Admitting Diagnosis ED Disposition    ED Disposition Condition [facility]: [place] [[id number]]  Level of Care: Medical Telemetry [104]  Diagnosis: Alcohol withdrawal ([place]) [291.81.ICD-9-CM]  Admitting Physician: Thereasa Solo, JEFFREY T [[id number]]  Attending  Physician: Thereasa Solo, JEFFREY T [[id number]]  Estimated length of stay: past midnight tomorrow  Certification:: I certify this patient will need inpatient services for at least 2 midnights  PT Class (Do Not Modify): Inpatient [101]  PT Acc Code (Do Not Modify): Private [1]       Medical History Past Medical History:  Diagnosis Date  . Acute pulmonary embolism ([place]) 04/11/2016  . Anxiety   . Arthritis   . Atrial tachycardia ([place]) 01/06/2016  . Depression   . DTs (delirium tremens) ([place])   . Dyspnea   . ED (erectile dysfunction)   . ETOH abuse   . Hypertension   . Lymphoma, non Hodgkin's 09/25/2011   Stage 2  . Mitral regurgitation 01/20/2016  . Mobitz I 10/09/2015  . Murmur 01/06/2016  . Occasional tremors   . PAC (premature atrial contraction) 10/09/2015  . S/P minimally-invasive mitral valve repair 10/27/2016   Complex valvuloplasty including artificial Gore-tex neochord placement x6 and 34 mm Edwards Physio II ring annuloplasty via right mini thoracotomy approach    Allergies Allergies  Allergen Reactions  . Other Other (See Comments)    "Cactus" blisters on tongue    IV Location/Drains/Wounds Patient Lines/Drains/Airways Status   Active Line/Drains/Airways    Name:   Placement date:   Placement time:   Site:   Days:   Peripheral IV 12/07/18 Left Forearm   12/07/18  1630    Forearm   1   Peripheral IV 12/07/18 Right Forearm   12/07/18    1905    Forearm   1          Labs/Imaging Results for orders placed or performed during the hospital encounter of 12/07/18 (from the past 48 hour(s))  CBC     Status: Abnormal   Collection Time: 12/07/18  4:44 PM  Result Value Ref Range   WBC 4.5 4.0 - 10.5 K/uL   RBC 4.53 4.22 - 5.81 MIL/uL   Hemoglobin 12.0 (L) 13.0 - 17.0 g/dL   HCT 36.7 (L) 39.0 - 52.0 %   MCV 81.0 80.0 - 100.0 fL   MCH 26.5 26.0 - 34.0 pg   MCHC 32.7 30.0 - 36.0 g/dL   RDW 17.6 (H) 11.5 - 15.5 %   Platelets 321 150 - 400 K/uL   nRBC 0.9 (H) 0.0 - 0.2 %     Comment: Performed at [facility], [address] [place], [place] [address]  Ethanol     Status: None   Collection Time: 12/07/18  4:44 PM  Result Value Ref Range   Alcohol, Ethyl (B) <10 <10 mg/dL    Comment: (NOTE) Lowest detectable limit for serum alcohol is 10 mg/dL. For medical purposes only. Performed at [facility], [place] [address] [place], [place] [address]   Blood culture (routine x 2)     Status: None (Preliminary result)   Collection Time: 12/07/18  5:09 PM  Result Value Ref Range   Specimen Description BLOOD LEFT FOREARM    Special Requests      BOTTLES DRAWN AEROBIC AND ANAEROBIC Blood Culture adequate volume Performed at [facility], [place] [address], [place], [place] [address]    Culture  Setup Time      GRAM POSITIVE COCCI IN BOTH AEROBIC AND ANAEROBIC BOTTLES CRITICAL RESULT CALLED TO, READ BACK BY AND VERIFIED WITH: PHARMD BATCHELDER, N 1056 [id number] FCP    Culture GRAM POSITIVE COCCI    Report Status PENDING   Blood Culture ID Panel (Reflexed)     Status: Abnormal   Collection Time: 12/07/18  5:09 PM  Result Value Ref Range   Enterococcus species NOT DETECTED NOT DETECTED   Listeria monocytogenes NOT DETECTED NOT DETECTED   Staphylococcus species DETECTED (A) NOT DETECTED    Comment: Methicillin (oxacillin) resistant coagulase negative staphylococcus. Possible blood culture contaminant (unless isolated from more than one blood culture draw or clinical case suggests pathogenicity). No antibiotic treatment is indicated for blood  culture contaminants. CRITICAL RESULT CALLED TO, READ BACK BY AND VERIFIED WITH: PHARMD BATCHELDER, N [phone] FCP    Staphylococcus aureus (BCID) NOT DETECTED NOT DETECTED   Methicillin resistance DETECTED (A) NOT DETECTED    Comment: CRITICAL RESULT CALLED TO, READ BACK BY AND VERIFIED WITH: PHARMD BATCHELDER, N 1056 [id number] FCP    Streptococcus species NOT DETECTED NOT DETECTED   Streptococcus agalactiae NOT  DETECTED NOT DETECTED   Streptococcus pneumoniae NOT DETECTED NOT DETECTED   Streptococcus pyogenes NOT DETECTED NOT DETECTED   Acinetobacter baumannii NOT DETECTED NOT DETECTED   Enterobacteriaceae species NOT DETECTED NOT DETECTED   Enterobacter cloacae complex NOT DETECTED NOT DETECTED   Escherichia coli NOT DETECTED NOT DETECTED   Klebsiella oxytoca NOT DETECTED NOT DETECTED   Klebsiella pneumoniae NOT DETECTED NOT DETECTED   Proteus species NOT DETECTED NOT DETECTED   Serratia marcescens NOT DETECTED NOT DETECTED   Haemophilus  influenzae NOT DETECTED NOT DETECTED   Neisseria meningitidis NOT DETECTED NOT DETECTED   Pseudomonas aeruginosa NOT DETECTED NOT DETECTED   Candida albicans NOT DETECTED NOT DETECTED   Candida glabrata NOT DETECTED NOT DETECTED   Candida krusei NOT DETECTED NOT DETECTED   Candida parapsilosis NOT DETECTED NOT DETECTED   Candida tropicalis NOT DETECTED NOT DETECTED  Lactic acid, plasma     Status: None   Collection Time: 12/07/18  5:18 PM  Result Value Ref Range   Lactic Acid, Venous 1.8 0.5 - 1.9 mmol/L    Comment: Performed at [facility] [address] [place], [place] [address]  Blood culture (routine x 2)     Status: None (Preliminary result)   Collection Time: 12/07/18  5:19 PM  Result Value Ref Range   Specimen Description BLOOD LEFT FOREARM    Special Requests      BOTTLES DRAWN AEROBIC AND ANAEROBIC Blood Culture adequate volume   Culture      NO GROWTH < 24 HOURS Performed at [facility], [place] [address] [place], [place] [address]    Report Status PENDING   Comprehensive metabolic panel     Status: Abnormal   Collection Time: 12/07/18  7:10 PM  Result Value Ref Range   Sodium 136 135 - 145 mmol/L   Potassium 4.2 3.5 - 5.1 mmol/L   Chloride 102 98 - 111 mmol/L   CO2 22 22 - 32 mmol/L   Glucose, Bld 83 70 - 99 mg/dL   BUN 9 6 - 20 mg/dL   Creatinine, Ser 0.86 0.61 - 1.24 mg/dL   Calcium 8.7 (L) 8.9 - 10.3 mg/dL   Total  Protein 6.5 6.5 - 8.1 g/dL   Albumin 3.5 3.5 - 5.0 g/dL   AST 63 (H) 15 - 41 U/L   ALT 40 0 - 44 U/L   Alkaline Phosphatase 46 38 - 126 U/L   Total Bilirubin 0.8 0.3 - 1.2 mg/dL   GFR calc non Af Amer >60 >60 mL/min   GFR calc Af Amer >60 >60 mL/min   Anion gap 12 5 - 15    Comment: Performed at [facility], [place] [address], [place], [place] [address]  Protime-INR     Status: Abnormal   Collection Time: 12/07/18  7:10 PM  Result Value Ref Range   Prothrombin Time 57.1 (H) 11.4 - 15.2 seconds    Comment: REPEATED TO VERIFY   INR 6.68 (HH)     Comment: REPEATED TO VERIFY CRITICAL RESULT CALLED TO, READ BACK BY AND VERIFIED WITH: Liana Gerold 7209 12/07/2018 D BRADLEY Performed at [facility], [place] [address], [place], [place] [address]   Troponin I -     Status: None   Collection Time: 12/07/18  7:10 PM  Result Value Ref Range   Troponin I <0.03 <0.03 ng/mL    Comment: Performed at [facility] [address] [place], [place] [address]  Basic metabolic panel     Status: Abnormal   Collection Time: 12/08/18  3:39 AM  Result Value Ref Range   Sodium 140 135 - 145 mmol/L   Potassium 3.9 3.5 - 5.1 mmol/L   Chloride 109 98 - 111 mmol/L   CO2 23 22 - 32 mmol/L   Glucose, Bld 91 70 - 99 mg/dL   BUN 10 6 - 20 mg/dL   Creatinine, Ser 0.89 0.61 - 1.24 mg/dL   Calcium 8.5 (L) 8.9 -  10.3 mg/dL   GFR calc non Af Amer >60 >60 mL/min   GFR calc Af Amer >60 >60 mL/min   Anion gap 8 5 - 15    Comment: Performed at [facility] [address] [place], [place] [address]  CBC     Status: Abnormal   Collection Time: 12/08/18  3:39 AM  Result Value Ref Range   WBC 2.5 (L) 4.0 - 10.5 K/uL   RBC 3.86 (L) 4.22 - 5.81 MIL/uL   Hemoglobin 10.1 (L) 13.0 - 17.0 g/dL   HCT 32.3 (L) 39.0 - 52.0 %   MCV 83.7 80.0 - 100.0 fL   MCH 26.2 26.0 - 34.0 pg   MCHC 31.3 30.0 - 36.0 g/dL   RDW 17.3 (H) 11.5 - 15.5 %   Platelets 179 150 - 400 K/uL   nRBC 1.6 (H) 0.0 - 0.2 %     Comment: Performed at [facility] [address] [place], [place] [address]   Dg Chest 2 View  Result Date: 12/07/2018 CLINICAL DATA:  Great toe infection.  Shortness of breath. EXAM: CHEST - 2 VIEW COMPARISON:  11/01/2018 FINDINGS: Prior mitral valve replacement. Heart is upper limits normal in size. Continued reticulonodular densities project over the mid and lower lungs, right greater than left as shown on prior study and CT. No confluent airspace opacities or effusions. No acute bony abnormality. IMPRESSION: Stable chronic reticulonodular densities in the lungs, right greater than left, favor chronic infectious process or inflammation. No acute cardiopulmonary disease. Electronically Signed   By: Rolm Baptise M.D.   On: 12/07/2018 18:09   Dg Foot Complete Left  Result Date: 12/07/2018 CLINICAL DATA:  Great toe pain. EXAM: LEFT FOOT - COMPLETE 3+ VIEW COMPARISON:  None. FINDINGS: There is no evidence of fracture or dislocation. There is no evidence of arthropathy or other focal bone abnormality. Soft tissues are unremarkable. IMPRESSION: Negative. Electronically Signed   By: Rolm Baptise M.D.   On: 12/07/2018 18:10    Pending Labs Unresulted Labs (From admission, onward)    Start     Ordered   12/08/18 1004  Protime-INR  Daily,   R     12/08/18 1003          Vitals/Pain Today's Vitals   12/08/18 1045 12/08/18 1100 12/08/18 1115 12/08/18 1130  BP: 105/78 105/78 111/72 (!) 136/98  Pulse: 77 76 81 87  Resp: 17 17 20  (!) 21  Temp:      TempSrc:      SpO2: 99% 97% 98% 99%  PainSc:        Isolation Precautions No active isolations  Medications Medications  acetaminophen (TYLENOL) tablet 650 mg (650 mg Oral Given 12/08/18 0700)  LORazepam (ATIVAN) tablet 1 mg ( Oral See Alternative 12/08/18 0935)    Or  LORazepam (ATIVAN) injection 1 mg (1 mg Intravenous Given 12/08/18 0935)  thiamine (VITAMIN B-1) tablet 100 mg ( Oral See Alternative 12/08/18 0935)    Or  thiamine (B-1)  injection 100 mg (100 mg Intravenous Given 6/96/78 9381)  folic acid (FOLVITE) tablet 1 mg (1 mg Oral Given 12/08/18 0934)  multivitamin with minerals tablet 1 tablet (1 tablet Oral Given 12/08/18 0934)  LORazepam (ATIVAN) injection 0-4 mg (0 mg Intravenous Not Given 12/08/18 0600)    Followed by  LORazepam (ATIVAN) injection 0-4 mg (has no administration in time range)  piperacillin-tazobactam (ZOSYN) IVPB 3.375 g (0 g Intravenous Stopped 12/08/18 0551)  vancomycin (VANCOCIN) 1,250 mg  in sodium chloride 0.9 % 250 mL IVPB (0 mg Intravenous Stopped 12/08/18 1133)  sodium chloride 0.9 % bolus 1,000 mL (0 mLs Intravenous Stopped 12/07/18 1910)  LORazepam (ATIVAN) injection 1 mg (1 mg Intravenous Given 12/07/18 1716)  piperacillin-tazobactam (ZOSYN) IVPB 3.375 g (0 g Intravenous Stopped 12/07/18 1807)  vancomycin (VANCOCIN) 1,500 mg in sodium chloride 0.9 % 500 mL IVPB (0 mg Intravenous Stopped 12/07/18 2116)  morphine 4 MG/ML injection 4 mg (4 mg Intravenous Given 12/07/18 2042)  LORazepam (ATIVAN) injection 2 mg (2 mg Intravenous Given 12/07/18 2218)  sodium chloride 0.9 % bolus 1,000 mL (0 mLs Intravenous Stopped 12/07/18 2352)  sodium chloride 0.9 % 1,000 mL with thiamine 103 mg, folic acid 1 mg, multivitamins adult 10 mL infusion ( Intravenous Stopped 12/08/18 1049)    Mobility walks

## 2018-12-08 NOTE — Progress Notes (Signed)
Leonard Bentley TEAM 1 - Stepdown/ICU TEAM  Leonard Bentley  VOJ:[id number] DOB: Mar 24, 1961 DOA: 12/07/2018 PCP: [name], NP    Brief Narrative:  58 y.o. male with a hx of alcohol abuse, multiple PEs on Coumadin, and lymphoma status post chemotherapy who presented w/ tachycardia, infection of his left great toe, and suspected withdrawal from alcohol.    Significant Events: 1/30 admit   Subjective: The pt is tremulous, but alert and conversant. He denies cp, n/v, or abdom pain. He reports ongoing pain in his L great toe, which is erythematous.   Assessment & Plan:  Cellulitis of L halux  not septic - lactic acid normal - cont empiric abx and follow - narrow abx coverage   Alcohol withdrawal on CIWA protocol - tremulous but not yet delirious - follow   Supratherapeutic INR INR over 6 - holding warfarin for now but not reversing as pt has hx of recurrent PE and no present evidence of bleeding   Non-Hodgkin's lymphoma  Recurrent pulmonary emboli  Cont warfarin per Pharmacy - no sx to suggest recurrence, and pt is no chronic anticoag   L ear cerumen impaction Pt c/o fullness in the L ear w/ diminished auditory acuity - inspected w/ otoscope which noted a dense cerumen impaction - will give trial of debrox to soften up impaction    DVT prophylaxis: warfarin  Code Status: FULL CODE Family Communication: no family present at time of exam  Disposition Plan: stable for med tele bed   Consultants:  none  Antimicrobials:  Zosyn Vanc    Objective: Blood pressure 101/73, pulse 86, temperature 98.5 F (36.9 C), temperature source Oral, resp. rate 19, SpO2 97 %.  Intake/Output Summary (Last 24 hours) at 12/08/2018 0955 Last data filed at 12/07/2018 2116 Gross per 24 hour  Intake 1600 ml  Output -  Net 1600 ml   There were no vitals filed for this visit.  Examination: General: No acute respiratory distress Lungs: Clear to auscultation bilaterally without wheezes or  crackles Cardiovascular: Regular rate and rhythm without murmur gallop or rub normal S1 and S2 Abdomen: Nontender, nondistended, soft, bowel sounds positive, no rebound, no ascites, no appreciable mass Extremities: 1+ B LE edema - L great toe red and warm but w/o purulent drainage or obvious acute wounds   CBC: Recent Labs  Lab 12/07/18 1644 12/08/18 0339  WBC 4.5 2.5*  HGB 12.0* 10.1*  HCT 36.7* 32.3*  MCV 81.0 83.7  PLT 321 993   Basic Metabolic Panel: Recent Labs  Lab 12/07/18 1910 12/08/18 0339  NA 136 140  K 4.2 3.9  CL 102 109  CO2 22 23  GLUCOSE 83 91  BUN 9 10  CREATININE 0.86 0.89  CALCIUM 8.7* 8.5*   GFR: Estimated Creatinine Clearance: 97.2 mL/min (by C-G formula based on SCr of 0.89 mg/dL).  Liver Function Tests: Recent Labs  Lab 12/07/18 1910  AST 63*  ALT 40  ALKPHOS 46  BILITOT 0.8  PROT 6.5  ALBUMIN 3.5    Coagulation Profile: Recent Labs  Lab 12/07/18 1603 12/07/18 1910  INR 8.0* 6.68*    Cardiac Enzymes: Recent Labs  Lab 12/07/18 1910  TROPONINI <0.03    HbA1C: Hgb A1c MFr Bld  Date/Time Value Ref Range Status  10/25/2016 12:23 PM 5.2 4.8 - 5.6 % Final    Comment:    (NOTE)         Pre-diabetes: 5.7 - 6.4  Diabetes: >6.4         Glycemic control for adults with diabetes: <7.0   04/03/2016 03:25 AM 6.1 (H) 4.8 - 5.6 % Final    Comment:    (NOTE)         Pre-diabetes: 5.7 - 6.4         Diabetes: >6.4         Glycemic control for adults with diabetes: <7.0      Recent Results (from the past 240 hour(s))  Blood culture (routine x 2)     Status: None (Preliminary result)   Collection Time: 12/07/18  5:09 PM  Result Value Ref Range Status   Specimen Description BLOOD LEFT FOREARM  Final   Special Requests   Final    BOTTLES DRAWN AEROBIC AND ANAEROBIC Blood Culture adequate volume   Culture  Setup Time   Final    GRAM POSITIVE COCCI AEROBIC BOTTLE ONLY Organism ID to follow    Culture   Final    NO GROWTH  < 24 HOURS Performed at [facility], [address] [place], [place] [address]    Report Status PENDING  Incomplete  Blood culture (routine x 2)     Status: None (Preliminary result)   Collection Time: 12/07/18  5:19 PM  Result Value Ref Range Status   Specimen Description BLOOD LEFT FOREARM  Final   Special Requests   Final    BOTTLES DRAWN AEROBIC AND ANAEROBIC Blood Culture adequate volume   Culture   Final    NO GROWTH < 24 HOURS Performed at [facility], [place] [address] [place], [place] [address]    Report Status PENDING  Incomplete     Scheduled Meds: . acetaminophen  650 mg Oral R7E  . folic acid  1 mg Oral Daily  . LORazepam  0-4 mg Intravenous Q6H   Followed by  . [START ON 12/10/2018] LORazepam  0-4 mg Intravenous Q12H  . multivitamin with minerals  1 tablet Oral Daily  . thiamine  100 mg Oral Daily   Or  . thiamine  100 mg Intravenous Daily     LOS: 1 [place]   Cherene Altes, MD [facility] Office  [phone] Pager - Text Page per Amion  If 7PM-7AM, please contact night-coverage per Amion 12/08/2018, 9:55 AM

## 2018-12-09 LAB — COMPREHENSIVE METABOLIC PANEL
ALT: 31 U/L (ref 0–44)
AST: 51 U/L — ABNORMAL HIGH (ref 15–41)
Albumin: 3 g/dL — ABNORMAL LOW (ref 3.5–5.0)
Alkaline Phosphatase: 40 U/L (ref 38–126)
Anion gap: 10 (ref 5–15)
BUN: 7 mg/dL (ref 6–20)
CO2: 20 mmol/L — ABNORMAL LOW (ref 22–32)
Calcium: 8.9 mg/dL (ref 8.9–10.3)
Chloride: 107 mmol/L (ref 98–111)
Creatinine, Ser: 0.79 mg/dL (ref 0.61–1.24)
GFR calc Af Amer: >60 mL/min (ref >60)
GFR calc non Af Amer: >60 mL/min (ref >60)
Glucose, Bld: 97 mg/dL (ref 70–99)
Potassium: 4.1 mmol/L (ref 3.5–5.1)
Sodium: 137 mmol/L (ref 135–145)
Total Bilirubin: 1.1 mg/dL (ref 0.3–1.2)
Total Protein: 5.7 g/dL — ABNORMAL LOW (ref 6.5–8.1)

## 2018-12-09 LAB — CBC
HCT: 32.4 % — ABNORMAL LOW (ref 39.0–52.0)
Hemoglobin: 10.2 g/dL — ABNORMAL LOW (ref 13.0–17.0)
MCH: 26.1 pg (ref 26.0–34.0)
MCHC: 31.5 g/dL (ref 30.0–36.0)
MCV: 82.9 fL (ref 80.0–100.0)
Platelets: 176 10*3/uL (ref 150–400)
RBC: 3.91 MIL/uL — ABNORMAL LOW (ref 4.22–5.81)
RDW: 16.9 % — ABNORMAL HIGH (ref 11.5–15.5)
WBC: 2.9 10*3/uL — ABNORMAL LOW (ref 4.0–10.5)
nRBC: 1 % — ABNORMAL HIGH (ref 0.0–0.2)

## 2018-12-09 LAB — URIC ACID: Uric Acid, Serum: 5.2 mg/dL (ref 3.7–8.6)

## 2018-12-09 LAB — PHOSPHORUS: Phosphorus: 3.1 mg/dL (ref 2.5–4.6)

## 2018-12-09 LAB — PROTIME-INR
INR: 2.42
Prothrombin Time: 26 seconds — ABNORMAL HIGH (ref 11.4–15.2)

## 2018-12-09 LAB — MAGNESIUM: Magnesium: 1.5 mg/dL — ABNORMAL LOW (ref 1.7–2.4)

## 2018-12-09 MED ORDER — MAGNESIUM SULFATE 2 GM/50ML IV SOLN
2.0000 g | Freq: Once | INTRAVENOUS | Status: AC
  Administered 2018-12-09: 2 g via INTRAVENOUS
  Filled 2018-12-09: qty 50

## 2018-12-09 NOTE — Progress Notes (Signed)
Patient ID: Leonard Bentley, male   DOB: [birth year], 58 y.o.   MRN: [id number]  PROGRESS NOTE    Leonard Bentley  KXF:[id number] DOB: 1961-04-05 DOA: 12/07/2018 PCP: [name], NP   Brief Narrative:  58 year old male with history of alcohol abuse, multiple PEs on Coumadin and lymphoma status post chemotherapy presented with left great toe redness and swelling and was admitted for cellulitis and alcohol withdrawal.  Assessment & Plan:   Principal Problem:   Cellulitis Active Problems:   History of non-Hodgkin's lymphoma   Alcohol abuse   Anxiety   Supratherapeutic INR   Alcohol withdrawal (HCC)   Recurrent pulmonary emboli (HCC)   Left hallux cellulitis -Continue Ancef.  Cellulitis is improving.  Pain is improving.  Will start ambulating the patient.  Alcohol withdrawal -On CIWA protocol.  Improving.  Only very minimally tremulous.  DC telemetry.  Continue thiamine, multivitamin and folate.  Counseled about abstinence from alcohol.  Social worker consult.  Supratherapeutic INR -From Coumadin use.  Resolved.  INR is 2.4 today.  Resume Coumadin.  No signs of bleeding  Recurrent pulmonary embolism  -Resume Coumadin.  Monitor INR.  Non-Hodgkin's lymphoma -Status post chemotherapy.  Outpatient follow-up  Hypomagnesemia -Replace.  Repeat a.m. labs  DVT prophylaxis: Resume Coumadin Code Status: Full Family Communication: None at bedside Disposition Plan: Probable discharge tomorrow  Consultants: None  Procedures: None  Antimicrobials:  Vanco and Zosyn 12/07/2018-12/08/2018 Ancef from 12/08/2018 onwards   Subjective: Patient seen and examined at bedside.  He feels slightly better.  His left toe swelling and pain is improving.  He has not walked much.  He is a poor historian but denies any hallucinations.  No overnight fever or vomiting.  Objective: Vitals:   12/08/18 1531 12/08/18 1818 12/08/18 2126 12/09/18 0500  BP: (!) 127/94 (!) 112/93 113/89 (!) 119/97  Pulse: 80  (!) 131 86 69  Resp: 18  16 16   Temp: 98.1 F (36.7 C)  (!) 97.5 F (36.4 C) 97.7 F (36.5 C)  TempSrc: Oral  Oral Oral  SpO2: 100%  99%     Intake/Output Summary (Last 24 hours) at 12/09/2018 1120 Last data filed at 12/09/2018 0600 Gross per 24 hour  Intake -  Output 250 ml  Net -250 ml   There were no vitals filed for this visit.  Examination:  General exam: Appears calm and comfortable.  Poor historian.  Very minimal tremors Respiratory system: Bilateral decreased breath sounds at bases Cardiovascular system: S1 & S2 heard, Rate controlled currently Gastrointestinal system: Abdomen is nondistended, soft and nontender. Normal bowel sounds heard. Extremities: No cyanosis, clubbing; left great toe is only slightly warm, erythema is improving.  No purulent discharge.  Data Reviewed: I have personally reviewed following labs and imaging studies  CBC: Recent Labs  Lab 12/07/18 1644 12/08/18 0339 12/09/18 0906  WBC 4.5 2.5* 2.9*  HGB 12.0* 10.1* 10.2*  HCT 36.7* 32.3* 32.4*  MCV 81.0 83.7 82.9  PLT 321 179 696   Basic Metabolic Panel: Recent Labs  Lab 12/07/18 1910 12/08/18 0339 12/09/18 0702  NA 136 140 137  K 4.2 3.9 4.1  CL 102 109 107  CO2 22 23 20*  GLUCOSE 83 91 97  BUN 9 10 7   CREATININE 0.86 0.89 0.79  CALCIUM 8.7* 8.5* 8.9  MG  --   --  1.5*  PHOS  --   --  3.1   GFR: Estimated Creatinine Clearance: 108.1 mL/min (by C-G formula based on SCr of  0.79 mg/dL). Liver Function Tests: Recent Labs  Lab 12/07/18 1910 12/09/18 0702  AST 63* 51*  ALT 40 31  ALKPHOS 46 40  BILITOT 0.8 1.1  PROT 6.5 5.7*  ALBUMIN 3.5 3.0*   No results for input(s): LIPASE, AMYLASE in the last 168 hours. No results for input(s): AMMONIA in the last 168 hours. Coagulation Profile: Recent Labs  Lab 12/07/18 1603 12/07/18 1910 12/08/18 1210 12/09/18 0702  INR 8.0* 6.68* 4.07* 2.42   Cardiac Enzymes: Recent Labs  Lab 12/07/18 1910  TROPONINI <0.03   BNP (last 3  results) No results for input(s): PROBNP in the last 8760 hours. HbA1C: No results for input(s): HGBA1C in the last 72 hours. CBG: No results for input(s): GLUCAP in the last 168 hours. Lipid Profile: No results for input(s): CHOL, HDL, LDLCALC, TRIG, CHOLHDL, LDLDIRECT in the last 72 hours. Thyroid Function Tests: No results for input(s): TSH, T4TOTAL, FREET4, T3FREE, THYROIDAB in the last 72 hours. Anemia Panel: No results for input(s): VITAMINB12, FOLATE, FERRITIN, TIBC, IRON, RETICCTPCT in the last 72 hours. Sepsis Labs: Recent Labs  Lab 12/07/18 1718  LATICACIDVEN 1.8    Recent Results (from the past 240 hour(s))  Blood culture (routine x 2)     Status: Abnormal (Preliminary result)   Collection Time: 12/07/18  5:09 PM  Result Value Ref Range Status   Specimen Description BLOOD LEFT FOREARM  Final   Special Requests   Final    BOTTLES DRAWN AEROBIC AND ANAEROBIC Blood Culture adequate volume Performed at [facility], [place] [address] [place], [place] [address]    Culture  Setup Time   Final    GRAM POSITIVE COCCI IN BOTH AEROBIC AND ANAEROBIC BOTTLES CRITICAL RESULT CALLED TO, READ BACK BY AND VERIFIED WITH: [place], N 1056 [id number] FCP    Culture (A)  Final    STAPHYLOCOCCUS SPECIES (COAGULASE NEGATIVE) THE SIGNIFICANCE OF ISOLATING THIS ORGANISM FROM A SINGLE SET OF BLOOD CULTURES WHEN MULTIPLE SETS ARE DRAWN IS UNCERTAIN. PLEASE NOTIFY THE MICROBIOLOGY DEPARTMENT WITHIN ONE WEEK IF SPECIATION AND SENSITIVITIES ARE REQUIRED.    Report Status PENDING  Incomplete  Blood Culture ID Panel (Reflexed)     Status: Abnormal   Collection Time: 12/07/18  5:09 PM  Result Value Ref Range Status   Enterococcus species NOT DETECTED NOT DETECTED Final   Listeria monocytogenes NOT DETECTED NOT DETECTED Final   Staphylococcus species DETECTED (A) NOT DETECTED Final    Comment: Methicillin (oxacillin) resistant coagulase negative staphylococcus. Possible blood culture  contaminant (unless isolated from more than one blood culture draw or clinical case suggests pathogenicity). No antibiotic treatment is indicated for blood  culture contaminants. CRITICAL RESULT CALLED TO, READ BACK BY AND VERIFIED WITH: PHARMD BATCHELDER, N [phone] FCP    Staphylococcus aureus (BCID) NOT DETECTED NOT DETECTED Final   Methicillin resistance DETECTED (A) NOT DETECTED Final    Comment: CRITICAL RESULT CALLED TO, READ BACK BY AND VERIFIED WITH: PHARMD BATCHELDER, N [id number] FCP    Streptococcus species NOT DETECTED NOT DETECTED Final   Streptococcus agalactiae NOT DETECTED NOT DETECTED Final   Streptococcus pneumoniae NOT DETECTED NOT DETECTED Final   Streptococcus pyogenes NOT DETECTED NOT DETECTED Final   Acinetobacter baumannii NOT DETECTED NOT DETECTED Final   Enterobacteriaceae species NOT DETECTED NOT DETECTED Final   Enterobacter cloacae complex NOT DETECTED NOT DETECTED Final   Escherichia coli NOT DETECTED NOT DETECTED Final   Klebsiella oxytoca NOT DETECTED NOT DETECTED Final  Klebsiella pneumoniae NOT DETECTED NOT DETECTED Final   Proteus species NOT DETECTED NOT DETECTED Final   Serratia marcescens NOT DETECTED NOT DETECTED Final   Haemophilus influenzae NOT DETECTED NOT DETECTED Final   Neisseria meningitidis NOT DETECTED NOT DETECTED Final   Pseudomonas aeruginosa NOT DETECTED NOT DETECTED Final   Candida albicans NOT DETECTED NOT DETECTED Final   Candida glabrata NOT DETECTED NOT DETECTED Final   Candida krusei NOT DETECTED NOT DETECTED Final   Candida parapsilosis NOT DETECTED NOT DETECTED Final   Candida tropicalis NOT DETECTED NOT DETECTED Final  Blood culture (routine x 2)     Status: None (Preliminary result)   Collection Time: 12/07/18  5:19 PM  Result Value Ref Range Status   Specimen Description BLOOD LEFT FOREARM  Final   Special Requests   Final    BOTTLES DRAWN AEROBIC AND ANAEROBIC Blood Culture adequate volume Performed at [facility], [address], [place], [place] [address]    Culture NO GROWTH 2 DAYS  Final   Report Status PENDING  Incomplete         Radiology Studies: Dg Chest 2 View  Result Date: 12/07/2018 CLINICAL DATA:  Great toe infection.  Shortness of breath. EXAM: CHEST - 2 VIEW COMPARISON:  11/01/2018 FINDINGS: Prior mitral valve replacement. Heart is upper limits normal in size. Continued reticulonodular densities project over the mid and lower lungs, right greater than left as shown on prior study and CT. No confluent airspace opacities or effusions. No acute bony abnormality. IMPRESSION: Stable chronic reticulonodular densities in the lungs, right greater than left, favor chronic infectious process or inflammation. No acute cardiopulmonary disease. Electronically Signed   By: Rolm Baptise M.D.   On: 12/07/2018 18:09   Dg Foot Complete Left  Result Date: 12/07/2018 CLINICAL DATA:  Great toe pain. EXAM: LEFT FOOT - COMPLETE 3+ VIEW COMPARISON:  None. FINDINGS: There is no evidence of fracture or dislocation. There is no evidence of arthropathy or other focal bone abnormality. Soft tissues are unremarkable. IMPRESSION: Negative. Electronically Signed   By: Rolm Baptise M.D.   On: 12/07/2018 18:10        Scheduled Meds: . carbamide peroxide  5 drop Left EAR BID  . cloNIDine  0.1 mg Oral TID  . folic acid  1 mg Oral Daily  . LORazepam  0-4 mg Intravenous Q6H   Followed by  . [START ON 12/10/2018] LORazepam  0-4 mg Intravenous Q12H  . multivitamin with minerals  1 tablet Oral Daily  . nicotine  14 mg Transdermal QHS  . thiamine  100 mg Oral Daily   Continuous Infusions: .  ceFAZolin (ANCEF) IV 1 g (12/09/18 0541)  . magnesium sulfate 1 - 4 g bolus IVPB 2 g (12/09/18 1049)     LOS: 2 days        Aline August, MD [facility] Pager [phone]  If 7PM-7AM, please contact night-coverage [email or web address] Password [email or web address] 12/09/2018, 11:20 AM

## 2018-12-10 DIAGNOSIS — F1010 Alcohol abuse, uncomplicated: Secondary | ICD-10-CM

## 2018-12-10 DIAGNOSIS — I2699 Other pulmonary embolism without acute cor pulmonale: Secondary | ICD-10-CM

## 2018-12-10 DIAGNOSIS — F10239 Alcohol dependence with withdrawal, unspecified: Secondary | ICD-10-CM

## 2018-12-10 DIAGNOSIS — L03032 Cellulitis of left toe: Principal | ICD-10-CM

## 2018-12-10 DIAGNOSIS — Z8572 Personal history of non-Hodgkin lymphomas: Secondary | ICD-10-CM

## 2018-12-10 DIAGNOSIS — R791 Abnormal coagulation profile: Secondary | ICD-10-CM

## 2018-12-10 LAB — BASIC METABOLIC PANEL
Anion gap: 8 (ref 5–15)
BUN: 6 mg/dL (ref 6–20)
CO2: 22 mmol/L (ref 22–32)
Calcium: 8.8 mg/dL — ABNORMAL LOW (ref 8.9–10.3)
Chloride: 108 mmol/L (ref 98–111)
Creatinine, Ser: 0.84 mg/dL (ref 0.61–1.24)
GFR calc Af Amer: >60 mL/min (ref >60)
GFR calc non Af Amer: >60 mL/min (ref >60)
Glucose, Bld: 101 mg/dL — ABNORMAL HIGH (ref 70–99)
Potassium: 3.7 mmol/L (ref 3.5–5.1)
Sodium: 138 mmol/L (ref 135–145)

## 2018-12-10 LAB — CULTURE, BLOOD (ROUTINE X 2): Special Requests: ADEQUATE

## 2018-12-10 LAB — CBC WITH DIFFERENTIAL/PLATELET
Abs Immature Granulocytes: 0.01 10*3/uL (ref 0.00–0.07)
Basophils Absolute: 0 10*3/uL (ref 0.0–0.1)
Basophils Relative: 1 %
Eosinophils Absolute: 0.1 10*3/uL (ref 0.0–0.5)
Eosinophils Relative: 4 %
HCT: 30.4 % — ABNORMAL LOW (ref 39.0–52.0)
Hemoglobin: 9.7 g/dL — ABNORMAL LOW (ref 13.0–17.0)
Immature Granulocytes: 0 %
Lymphocytes Relative: 14 %
Lymphs Abs: 0.4 10*3/uL — ABNORMAL LOW (ref 0.7–4.0)
MCH: 26.2 pg (ref 26.0–34.0)
MCHC: 31.9 g/dL (ref 30.0–36.0)
MCV: 82.2 fL (ref 80.0–100.0)
Monocytes Absolute: 0.6 10*3/uL (ref 0.1–1.0)
Monocytes Relative: 21 %
Neutro Abs: 1.6 10*3/uL — ABNORMAL LOW (ref 1.7–7.7)
Neutrophils Relative %: 60 %
Platelets: 172 10*3/uL (ref 150–400)
RBC: 3.7 MIL/uL — ABNORMAL LOW (ref 4.22–5.81)
RDW: 16.5 % — ABNORMAL HIGH (ref 11.5–15.5)
WBC: 2.8 10*3/uL — ABNORMAL LOW (ref 4.0–10.5)
nRBC: 1.4 % — ABNORMAL HIGH (ref 0.0–0.2)

## 2018-12-10 LAB — MAGNESIUM: Magnesium: 1.6 mg/dL — ABNORMAL LOW (ref 1.7–2.4)

## 2018-12-10 LAB — PROTIME-INR
INR: 1.76
Prothrombin Time: 20.3 seconds — ABNORMAL HIGH (ref 11.4–15.2)

## 2018-12-10 MED ORDER — MAGNESIUM SULFATE 2 GM/50ML IV SOLN
2.0000 g | Freq: Once | INTRAVENOUS | Status: AC
  Administered 2018-12-10: 2 g via INTRAVENOUS
  Filled 2018-12-10: qty 50

## 2018-12-10 MED ORDER — CARBAMIDE PEROXIDE 6.5 % OT SOLN: 5.0000 [drp] | Freq: Two times a day (BID) | OTIC | 0 refills | Status: DC

## 2018-12-10 MED ORDER — CEPHALEXIN 500 MG PO CAPS: 500.0000 mg | ORAL_CAPSULE | Freq: Three times a day (TID) | ORAL | 0 refills | Status: AC

## 2018-12-10 MED ORDER — ADULT MULTIVITAMIN W/MINERALS CH: 1.0000 | ORAL_TABLET | Freq: Every day | ORAL | Status: DC

## 2018-12-10 MED ORDER — TRAMADOL HCL 50 MG PO TABS: 50.0000 mg | ORAL_TABLET | Freq: Four times a day (QID) | ORAL | 0 refills | Status: DC | PRN

## 2018-12-10 MED ORDER — THIAMINE HCL 100 MG PO TABS: 100.0000 mg | ORAL_TABLET | Freq: Every day | ORAL | 0 refills | Status: DC

## 2018-12-10 MED ORDER — FOLIC ACID 1 MG PO TABS: 1.0000 mg | ORAL_TABLET | Freq: Every day | ORAL | 0 refills | Status: DC

## 2018-12-10 NOTE — Discharge Summary (Signed)
Physician Discharge Summary  Leonard Bentley YWV:[id number] DOB: Mar 19, 1961 DOA: 12/07/2018  PCP: [name], NP  Admit date: 12/07/2018 Discharge date: 12/10/2018  Admitted From: Home Disposition:  Home  Recommendations for Outpatient Follow-up:  1. Follow up with PCP in 1 week with repeat INR 2. followup with podiatrist as an outpatient 3. Follow-up in the ED if symptoms worsen or new. 4. Comply with medications and follow-up   Home Health: No Equipment/Devices: None  Discharge Condition: Stable CODE STATUS: Full Diet recommendation: Heart Healthy  Brief/Interim Summary:58 year old male with history of alcohol abuse, multiple PEs on Coumadin and lymphoma status post chemotherapy presented with left great toe redness and swelling and was admitted for cellulitis and alcohol withdrawal.  He was treated with IV antibiotics which were changed to IV Ancef.  His alcohol withdrawal is improving.  His cellulitis is improving.  He will be discharged on oral Keflex.    Discharge Diagnoses:  Principal Problem:   Cellulitis Active Problems:   History of non-Hodgkin's lymphoma   Alcohol abuse   Anxiety   Supratherapeutic INR   Alcohol withdrawal (HCC)   Recurrent pulmonary emboli (HCC)  Left hallux cellulitis -Started on broad-spectrum antibiotics which were changed to Ancef.  Cellulitis is improving.  Pain is improving.    Ambulating better.  Discharged on oral Keflex.  Outpatient follow-up.  Might need podiatry evaluation.  Alcohol withdrawal -On CIWA protocol.  Improving.    Currently not tremulous. Continue thiamine, multivitamin and folate.  Counseled about abstinence from alcohol.  Social worker consult.  Supratherapeutic INR -From Coumadin use.  Resolved.    Continue Coumadin on discharge.  Recurrent pulmonary embolism  -Continue Coumadin.  Outpatient INR monitoring.  Non-Hodgkin's lymphoma -Status post chemotherapy.  Outpatient follow-up  Hypomagnesemia -Replace.    Discharge Instructions  Discharge Instructions    Call MD for:  extreme fatigue   Complete by:  As directed    Call MD for:  persistant nausea and vomiting   Complete by:  As directed    Call MD for:  severe uncontrolled pain   Complete by:  As directed    Call MD for:  temperature >100.4   Complete by:  As directed    Diet - low sodium heart healthy   Complete by:  As directed    Increase activity slowly   Complete by:  As directed      Allergies as of 12/10/2018      Reactions   Other Other (See Comments)   "Cactus" blisters on tongue      Medication List    STOP taking these medications   warfarin 5 MG tablet Commonly known as:  COUMADIN     TAKE these medications   acetaminophen 650 MG CR tablet Commonly known as:  TYLENOL Take 650 mg by mouth every 8 (eight) hours as needed for pain.   carbamide peroxide 6.5 % OTIC solution Commonly known as:  DEBROX Place 5 drops into the left ear 2 (two) times daily.   cephALEXin 500 MG capsule Commonly known as:  KEFLEX Take 1 capsule (500 mg total) by mouth 3 (three) times daily for 5 days.   folic acid 1 MG tablet Commonly known as:  FOLVITE Take 1 tablet (1 mg total) by mouth daily.   multivitamin with minerals Tabs tablet Take 1 tablet by mouth daily.   thiamine 100 MG tablet Take 1 tablet (100 mg total) by mouth daily.   traMADol 50 MG tablet Commonly known as:  ULTRAM Take 1 tablet (50 mg total) by mouth every 6 (six) hours as needed for moderate pain.      Follow-up Information    [name], NP. Schedule an appointment as soon as possible for a visit in 1 week(s).   Specialty:  Nurse Practitioner Contact information: [address] [address] [phone]        Podiatrist. Schedule an appointment as soon as possible for a visit in 1 week(s).          Allergies  Allergen Reactions  . Other Other (See Comments)    "Cactus" blisters on tongue     Consultations:  None   Procedures/Studies: Dg Chest 2 View  Result Date: 12/07/2018 CLINICAL DATA:  Great toe infection.  Shortness of breath. EXAM: CHEST - 2 VIEW COMPARISON:  11/01/2018 FINDINGS: Prior mitral valve replacement. Heart is upper limits normal in size. Continued reticulonodular densities project over the mid and lower lungs, right greater than left as shown on prior study and CT. No confluent airspace opacities or effusions. No acute bony abnormality. IMPRESSION: Stable chronic reticulonodular densities in the lungs, right greater than left, favor chronic infectious process or inflammation. No acute cardiopulmonary disease. Electronically Signed   By: Rolm Baptise M.D.   On: 12/07/2018 18:09   Dg Foot Complete Left  Result Date: 12/07/2018 CLINICAL DATA:  Great toe pain. EXAM: LEFT FOOT - COMPLETE 3+ VIEW COMPARISON:  None. FINDINGS: There is no evidence of fracture or dislocation. There is no evidence of arthropathy or other focal bone abnormality. Soft tissues are unremarkable. IMPRESSION: Negative. Electronically Signed   By: Rolm Baptise M.D.   On: 12/07/2018 18:10     Subjective: Patient seen and examined at bedside.  He denies worsening fever, nausea, vomiting or foot pain.  Discharge Exam: Vitals:   12/09/18 2213 12/10/18 0521  BP: 116/88 (!) 133/107  Pulse: 75 84  Resp: 18 18  Temp: 98.4 F (36.9 C) 98.2 F (36.8 C)  SpO2: 100% 98%   Vitals:   12/09/18 0500 12/09/18 1401 12/09/18 2213 12/10/18 0521  BP: (!) 119/97 105/80 116/88 (!) 133/107  Pulse: 69 84 75 84  Resp: 16 18 18 18   Temp: 97.7 F (36.5 C) 98.8 F (37.1 C) 98.4 F (36.9 C) 98.2 F (36.8 C)  TempSrc: Oral Oral Oral Oral  SpO2:  100% 100% 98%  Weight:    87.2 kg    General: Pt is alert, awake, not in acute distress Cardiovascular: rate controlled, S1/S2 + Respiratory: bilateral decreased breath sounds at bases Abdominal: Soft, NT, ND, bowel sounds + Extremities: Trace edema, no  cyanosis. left great toe is only slightly warm, erythema is improving.  No purulent discharge.    The results of significant diagnostics from this hospitalization (including imaging, microbiology, ancillary and laboratory) are listed below for reference.     Microbiology: Recent Results (from the past 240 hour(s))  Blood culture (routine x 2)     Status: Abnormal (Preliminary result)   Collection Time: 12/07/18  5:09 PM  Result Value Ref Range Status   Specimen Description BLOOD LEFT FOREARM  Final   Special Requests   Final    BOTTLES DRAWN AEROBIC AND ANAEROBIC Blood Culture adequate volume Performed at [facility], [address], [place], [place] [address]    Culture  Setup Time   Final    GRAM POSITIVE COCCI IN BOTH AEROBIC AND ANAEROBIC BOTTLES CRITICAL RESULT CALLED TO, READ BACK BY  AND VERIFIED WITH: [place], N 1056 [id number] FCP    Culture (A)  Final    STAPHYLOCOCCUS SPECIES (COAGULASE NEGATIVE) THE SIGNIFICANCE OF ISOLATING THIS ORGANISM FROM A SINGLE SET OF BLOOD CULTURES WHEN MULTIPLE SETS ARE DRAWN IS UNCERTAIN. PLEASE NOTIFY THE MICROBIOLOGY DEPARTMENT WITHIN ONE WEEK IF SPECIATION AND SENSITIVITIES ARE REQUIRED.    Report Status PENDING  Incomplete  Blood Culture ID Panel (Reflexed)     Status: Abnormal   Collection Time: 12/07/18  5:09 PM  Result Value Ref Range Status   Enterococcus species NOT DETECTED NOT DETECTED Final   Listeria monocytogenes NOT DETECTED NOT DETECTED Final   Staphylococcus species DETECTED (A) NOT DETECTED Final    Comment: Methicillin (oxacillin) resistant coagulase negative staphylococcus. Possible blood culture contaminant (unless isolated from more than one blood culture draw or clinical case suggests pathogenicity). No antibiotic treatment is indicated for blood  culture contaminants. CRITICAL RESULT CALLED TO, READ BACK BY AND VERIFIED WITH: PHARMD BATCHELDER, N 1056 [id number] FCP    Staphylococcus aureus (BCID) NOT  DETECTED NOT DETECTED Final   Methicillin resistance DETECTED (A) NOT DETECTED Final    Comment: CRITICAL RESULT CALLED TO, READ BACK BY AND VERIFIED WITH: PHARMD BATCHELDER, N [phone] FCP    Streptococcus species NOT DETECTED NOT DETECTED Final   Streptococcus agalactiae NOT DETECTED NOT DETECTED Final   Streptococcus pneumoniae NOT DETECTED NOT DETECTED Final   Streptococcus pyogenes NOT DETECTED NOT DETECTED Final   Acinetobacter baumannii NOT DETECTED NOT DETECTED Final   Enterobacteriaceae species NOT DETECTED NOT DETECTED Final   Enterobacter cloacae complex NOT DETECTED NOT DETECTED Final   Escherichia coli NOT DETECTED NOT DETECTED Final   Klebsiella oxytoca NOT DETECTED NOT DETECTED Final   Klebsiella pneumoniae NOT DETECTED NOT DETECTED Final   Proteus species NOT DETECTED NOT DETECTED Final   Serratia marcescens NOT DETECTED NOT DETECTED Final   Haemophilus influenzae NOT DETECTED NOT DETECTED Final   Neisseria meningitidis NOT DETECTED NOT DETECTED Final   Pseudomonas aeruginosa NOT DETECTED NOT DETECTED Final   Candida albicans NOT DETECTED NOT DETECTED Final   Candida glabrata NOT DETECTED NOT DETECTED Final   Candida krusei NOT DETECTED NOT DETECTED Final   Candida parapsilosis NOT DETECTED NOT DETECTED Final   Candida tropicalis NOT DETECTED NOT DETECTED Final  Blood culture (routine x 2)     Status: None (Preliminary result)   Collection Time: 12/07/18  5:19 PM  Result Value Ref Range Status   Specimen Description BLOOD LEFT FOREARM  Final   Special Requests   Final    BOTTLES DRAWN AEROBIC AND ANAEROBIC Blood Culture adequate volume Performed at [facility], [address], [place], [place] [address]    Culture NO GROWTH 3 DAYS  Final   Report Status PENDING  Incomplete     Labs: BNP (last 3 results) No results for input(s): BNP in the last 8760 hours. Basic Metabolic Panel: Recent Labs  Lab 12/07/18 1910 12/08/18 0339 12/09/18 0702  12/10/18 0430  NA 136 140 137 138  K 4.2 3.9 4.1 3.7  CL 102 109 107 108  CO2 22 23 20* 22  GLUCOSE 83 91 97 101*  BUN 9 10 7 6   CREATININE 0.86 0.89 0.79 0.84  CALCIUM 8.7* 8.5* 8.9 8.8*  MG  --   --  1.5* 1.6*  PHOS  --   --  3.1  --    Liver Function Tests: Recent Labs  Lab 12/07/18 1910 12/09/18 2831  AST 63* 51*  ALT 40 31  ALKPHOS 46 40  BILITOT 0.8 1.1  PROT 6.5 5.7*  ALBUMIN 3.5 3.0*   No results for input(s): LIPASE, AMYLASE in the last 168 hours. No results for input(s): AMMONIA in the last 168 hours. CBC: Recent Labs  Lab 12/07/18 1644 12/08/18 0339 12/09/18 0906 12/10/18 0430  WBC 4.5 2.5* 2.9* 2.8*  NEUTROABS  --   --   --  1.6*  HGB 12.0* 10.1* 10.2* 9.7*  HCT 36.7* 32.3* 32.4* 30.4*  MCV 81.0 83.7 82.9 82.2  PLT 321 179 176 172   Cardiac Enzymes: Recent Labs  Lab 12/07/18 1910  TROPONINI <0.03   BNP: Invalid input(s): POCBNP CBG: No results for input(s): GLUCAP in the last 168 hours. D-Dimer No results for input(s): DDIMER in the last 72 hours. Hgb A1c No results for input(s): HGBA1C in the last 72 hours. Lipid Profile No results for input(s): CHOL, HDL, LDLCALC, TRIG, CHOLHDL, LDLDIRECT in the last 72 hours. Thyroid function studies No results for input(s): TSH, T4TOTAL, T3FREE, THYROIDAB in the last 72 hours.  Invalid input(s): FREET3 Anemia work up No results for input(s): VITAMINB12, FOLATE, FERRITIN, TIBC, IRON, RETICCTPCT in the last 72 hours. Urinalysis    Component Value Date/Time   COLORURINE RED (A) 11/01/2018 2149   APPEARANCEUR TURBID (A) 11/01/2018 2149   LABSPEC 1.018 11/01/2018 2149   PHURINE 7.0 11/01/2018 2149   GLUCOSEU NEGATIVE 11/01/2018 2149   HGBUR MODERATE (A) 11/01/2018 2149   BILIRUBINUR NEGATIVE 11/01/2018 2149   [place] NEGATIVE 11/01/2018 2149   PROTEINUR 100 (A) 11/01/2018 2149   UROBILINOGEN 1.0 01/25/2013 0825   NITRITE NEGATIVE 11/01/2018 2149   LEUKOCYTESUR NEGATIVE 11/01/2018 2149    Sepsis Labs Invalid input(s): PROCALCITONIN,  WBC,  LACTICIDVEN Microbiology Recent Results (from the past 240 hour(s))  Blood culture (routine x 2)     Status: Abnormal (Preliminary result)   Collection Time: 12/07/18  5:09 PM  Result Value Ref Range Status   Specimen Description BLOOD LEFT FOREARM  Final   Special Requests   Final    BOTTLES DRAWN AEROBIC AND ANAEROBIC Blood Culture adequate volume Performed at [facility], [place] [address], [place], [place] [address]    Culture  Setup Time   Final    GRAM POSITIVE COCCI IN BOTH AEROBIC AND ANAEROBIC BOTTLES CRITICAL RESULT CALLED TO, READ BACK BY AND VERIFIED WITH: [place], N 1056 [id number] FCP    Culture (A)  Final    STAPHYLOCOCCUS SPECIES (COAGULASE NEGATIVE) THE SIGNIFICANCE OF ISOLATING THIS ORGANISM FROM A SINGLE SET OF BLOOD CULTURES WHEN MULTIPLE SETS ARE DRAWN IS UNCERTAIN. PLEASE NOTIFY THE MICROBIOLOGY DEPARTMENT WITHIN ONE WEEK IF SPECIATION AND SENSITIVITIES ARE REQUIRED.    Report Status PENDING  Incomplete  Blood Culture ID Panel (Reflexed)     Status: Abnormal   Collection Time: 12/07/18  5:09 PM  Result Value Ref Range Status   Enterococcus species NOT DETECTED NOT DETECTED Final   Listeria monocytogenes NOT DETECTED NOT DETECTED Final   Staphylococcus species DETECTED (A) NOT DETECTED Final    Comment: Methicillin (oxacillin) resistant coagulase negative staphylococcus. Possible blood culture contaminant (unless isolated from more than one blood culture draw or clinical case suggests pathogenicity). No antibiotic treatment is indicated for blood  culture contaminants. CRITICAL RESULT CALLED TO, READ BACK BY AND VERIFIED WITH: [place], N 1056 [id number] FCP    Staphylococcus aureus (BCID) NOT DETECTED NOT DETECTED Final   Methicillin resistance DETECTED (A) NOT  DETECTED Final    Comment: CRITICAL RESULT CALLED TO, READ BACK BY AND VERIFIED WITH: PHARMD BATCHELDER, N [id number] FCP     Streptococcus species NOT DETECTED NOT DETECTED Final   Streptococcus agalactiae NOT DETECTED NOT DETECTED Final   Streptococcus pneumoniae NOT DETECTED NOT DETECTED Final   Streptococcus pyogenes NOT DETECTED NOT DETECTED Final   Acinetobacter baumannii NOT DETECTED NOT DETECTED Final   Enterobacteriaceae species NOT DETECTED NOT DETECTED Final   Enterobacter cloacae complex NOT DETECTED NOT DETECTED Final   Escherichia coli NOT DETECTED NOT DETECTED Final   Klebsiella oxytoca NOT DETECTED NOT DETECTED Final   Klebsiella pneumoniae NOT DETECTED NOT DETECTED Final   Proteus species NOT DETECTED NOT DETECTED Final   Serratia marcescens NOT DETECTED NOT DETECTED Final   Haemophilus influenzae NOT DETECTED NOT DETECTED Final   Neisseria meningitidis NOT DETECTED NOT DETECTED Final   Pseudomonas aeruginosa NOT DETECTED NOT DETECTED Final   Candida albicans NOT DETECTED NOT DETECTED Final   Candida glabrata NOT DETECTED NOT DETECTED Final   Candida krusei NOT DETECTED NOT DETECTED Final   Candida parapsilosis NOT DETECTED NOT DETECTED Final   Candida tropicalis NOT DETECTED NOT DETECTED Final  Blood culture (routine x 2)     Status: None (Preliminary result)   Collection Time: 12/07/18  5:19 PM  Result Value Ref Range Status   Specimen Description BLOOD LEFT FOREARM  Final   Special Requests   Final    BOTTLES DRAWN AEROBIC AND ANAEROBIC Blood Culture adequate volume Performed at [facility], [address], [place], [place] [address]    Culture NO GROWTH 3 DAYS  Final   Report Status PENDING  Incomplete     Time coordinating discharge: 35 minutes  SIGNED:   Aline August, MD  [facility] 12/10/2018, 9:03 AM Pager: [phone]  If 7PM-7AM, please contact night-coverage [email or web address] Password [email or web address]

## 2018-12-10 NOTE — Progress Notes (Signed)
Pt given discharge instructions, prescriptions, and care notes. Pt verbalized understanding AEB no further questions or concerns at this time. IV was discontinued, no redness, pain, or swelling noted at this time. Pt left the floor via wheelchair with staff in stable condition. 

## 2018-12-10 NOTE — Progress Notes (Signed)
Patient has follow up [facility], provided with [facility] letter. No further CM needs.

## 2018-12-12 LAB — CULTURE, BLOOD (ROUTINE X 2)
Culture: NO GROWTH
Special Requests: ADEQUATE

## 2018-12-15 ENCOUNTER — Ambulatory Visit: Payer: Self-pay | Admitting: Family Medicine

## 2018-12-18 ENCOUNTER — Ambulatory Visit: Payer: Self-pay | Admitting: Family Medicine

## 2018-12-19 ENCOUNTER — Ambulatory Visit: Payer: Self-pay | Admitting: Family Medicine

## 2018-12-21 ENCOUNTER — Encounter: Payer: Self-pay | Admitting: Family Medicine

## 2018-12-27 ENCOUNTER — Other Ambulatory Visit: Payer: Self-pay

## 2018-12-27 ENCOUNTER — Inpatient Hospital Stay (HOSPITAL_COMMUNITY): Payer: Self-pay

## 2018-12-27 ENCOUNTER — Inpatient Hospital Stay (HOSPITAL_COMMUNITY)
Admission: EM | Admit: 2018-12-27 | Discharge: 2018-12-31 | DRG: 603 | Disposition: A | Discharge status: Home / Self Care | Payer: Self-pay | Attending: Internal Medicine | Admitting: Internal Medicine

## 2018-12-27 ENCOUNTER — Emergency Department (HOSPITAL_COMMUNITY): Payer: Self-pay

## 2018-12-27 ENCOUNTER — Encounter (HOSPITAL_COMMUNITY): Payer: Self-pay | Admitting: Emergency Medicine

## 2018-12-27 ENCOUNTER — Ambulatory Visit: Payer: Self-pay | Admitting: Family Medicine

## 2018-12-27 ENCOUNTER — Ambulatory Visit (INDEPENDENT_AMBULATORY_CARE_PROVIDER_SITE_OTHER): Payer: Self-pay | Admitting: Family Medicine

## 2018-12-27 ENCOUNTER — Encounter: Payer: Self-pay | Admitting: Family Medicine

## 2018-12-27 VITALS — BP 110/80 | HR 80 | Temp 98.2°F | Ht 69.5 in | Wt 187.8 lb

## 2018-12-27 DIAGNOSIS — Z8249 Family history of ischemic heart disease and other diseases of the circulatory system: Secondary | ICD-10-CM

## 2018-12-27 DIAGNOSIS — D61818 Other pancytopenia: Secondary | ICD-10-CM | POA: Diagnosis present

## 2018-12-27 DIAGNOSIS — I341 Nonrheumatic mitral (valve) prolapse: Secondary | ICD-10-CM | POA: Diagnosis present

## 2018-12-27 DIAGNOSIS — S6000XA Contusion of unspecified finger without damage to nail, initial encounter: Secondary | ICD-10-CM

## 2018-12-27 DIAGNOSIS — R569 Unspecified convulsions: Secondary | ICD-10-CM | POA: Diagnosis present

## 2018-12-27 DIAGNOSIS — Z79899 Other long term (current) drug therapy: Secondary | ICD-10-CM

## 2018-12-27 DIAGNOSIS — Z823 Family history of stroke: Secondary | ICD-10-CM

## 2018-12-27 DIAGNOSIS — Z952 Presence of prosthetic heart valve: Secondary | ICD-10-CM

## 2018-12-27 DIAGNOSIS — F10239 Alcohol dependence with withdrawal, unspecified: Secondary | ICD-10-CM | POA: Diagnosis present

## 2018-12-27 DIAGNOSIS — I10 Essential (primary) hypertension: Secondary | ICD-10-CM | POA: Diagnosis present

## 2018-12-27 DIAGNOSIS — L03032 Cellulitis of left toe: Principal | ICD-10-CM | POA: Diagnosis present

## 2018-12-27 DIAGNOSIS — Z809 Family history of malignant neoplasm, unspecified: Secondary | ICD-10-CM

## 2018-12-27 DIAGNOSIS — I959 Hypotension, unspecified: Secondary | ICD-10-CM

## 2018-12-27 DIAGNOSIS — L97529 Non-pressure chronic ulcer of other part of left foot with unspecified severity: Secondary | ICD-10-CM

## 2018-12-27 DIAGNOSIS — Z86711 Personal history of pulmonary embolism: Secondary | ICD-10-CM

## 2018-12-27 DIAGNOSIS — Z8572 Personal history of non-Hodgkin lymphomas: Secondary | ICD-10-CM

## 2018-12-27 DIAGNOSIS — Z7901 Long term (current) use of anticoagulants: Secondary | ICD-10-CM

## 2018-12-27 DIAGNOSIS — L03119 Cellulitis of unspecified part of limb: Secondary | ICD-10-CM

## 2018-12-27 DIAGNOSIS — L97523 Non-pressure chronic ulcer of other part of left foot with necrosis of muscle: Secondary | ICD-10-CM

## 2018-12-27 DIAGNOSIS — G629 Polyneuropathy, unspecified: Secondary | ICD-10-CM | POA: Diagnosis present

## 2018-12-27 DIAGNOSIS — S60122A Contusion of left index finger with damage to nail, initial encounter: Secondary | ICD-10-CM | POA: Diagnosis present

## 2018-12-27 DIAGNOSIS — Z79891 Long term (current) use of opiate analgesic: Secondary | ICD-10-CM

## 2018-12-27 DIAGNOSIS — R791 Abnormal coagulation profile: Secondary | ICD-10-CM

## 2018-12-27 DIAGNOSIS — F1010 Alcohol abuse, uncomplicated: Secondary | ICD-10-CM

## 2018-12-27 DIAGNOSIS — F17210 Nicotine dependence, cigarettes, uncomplicated: Secondary | ICD-10-CM | POA: Diagnosis present

## 2018-12-27 DIAGNOSIS — X58XXXA Exposure to other specified factors, initial encounter: Secondary | ICD-10-CM | POA: Diagnosis present

## 2018-12-27 LAB — CBC WITH DIFFERENTIAL/PLATELET
Abs Immature Granulocytes: 0.03 10*3/uL (ref 0.00–0.07)
Basophils Absolute: 0 10*3/uL (ref 0.0–0.1)
Basophils Relative: 1 %
Eosinophils Absolute: 0 10*3/uL (ref 0.0–0.5)
Eosinophils Relative: 1 %
HCT: 38.6 % — ABNORMAL LOW (ref 39.0–52.0)
Hemoglobin: 12.6 g/dL — ABNORMAL LOW (ref 13.0–17.0)
Immature Granulocytes: 1 %
Lymphocytes Relative: 17 %
Lymphs Abs: 0.9 10*3/uL (ref 0.7–4.0)
MCH: 26.8 pg (ref 26.0–34.0)
MCHC: 32.6 g/dL (ref 30.0–36.0)
MCV: 82 fL (ref 80.0–100.0)
Monocytes Absolute: 0.6 10*3/uL (ref 0.1–1.0)
Monocytes Relative: 12 %
Neutro Abs: 3.6 10*3/uL (ref 1.7–7.7)
Neutrophils Relative %: 68 %
Platelets: 139 10*3/uL — ABNORMAL LOW (ref 150–400)
RBC: 4.71 MIL/uL (ref 4.22–5.81)
RDW: 16.4 % — ABNORMAL HIGH (ref 11.5–15.5)
WBC: 5.1 10*3/uL (ref 4.0–10.5)
nRBC: 1.4 % — ABNORMAL HIGH (ref 0.0–0.2)

## 2018-12-27 LAB — PROTIME-INR
INR: >10
Prothrombin Time: >90 seconds — ABNORMAL HIGH (ref 11.4–15.2)

## 2018-12-27 LAB — COMPREHENSIVE METABOLIC PANEL
ALT: 52 U/L — ABNORMAL HIGH (ref 0–44)
AST: 85 U/L — ABNORMAL HIGH (ref 15–41)
Albumin: 4 g/dL (ref 3.5–5.0)
Alkaline Phosphatase: 54 U/L (ref 38–126)
Anion gap: 16 — ABNORMAL HIGH (ref 5–15)
BUN: 9 mg/dL (ref 6–20)
CO2: 20 mmol/L — ABNORMAL LOW (ref 22–32)
Calcium: 9.2 mg/dL (ref 8.9–10.3)
Chloride: 102 mmol/L (ref 98–111)
Creatinine, Ser: 0.89 mg/dL (ref 0.61–1.24)
GFR calc Af Amer: >60 mL/min (ref >60)
GFR calc non Af Amer: >60 mL/min (ref >60)
Glucose, Bld: 108 mg/dL — ABNORMAL HIGH (ref 70–99)
Potassium: 4.6 mmol/L (ref 3.5–5.1)
Sodium: 138 mmol/L (ref 135–145)
Total Bilirubin: 0.5 mg/dL (ref 0.3–1.2)
Total Protein: 7.3 g/dL (ref 6.5–8.1)

## 2018-12-27 LAB — ETHANOL: Alcohol, Ethyl (B): 54 mg/dL — ABNORMAL HIGH

## 2018-12-27 LAB — LIPASE, BLOOD: Lipase: 85 U/L — ABNORMAL HIGH (ref 11–51)

## 2018-12-27 MED ORDER — THIAMINE HCL 100 MG/ML IJ SOLN
100.0000 mg | Freq: Every day | INTRAMUSCULAR | Status: DC
  Administered 2018-12-27: 100 mg via INTRAVENOUS
  Filled 2018-12-27: qty 2

## 2018-12-27 MED ORDER — LORAZEPAM 2 MG/ML IJ SOLN
1.0000 mg | Freq: Four times a day (QID) | INTRAMUSCULAR | Status: AC | PRN
  Administered 2018-12-27 – 2018-12-28 (×2): 1 mg via INTRAVENOUS
  Filled 2018-12-27 (×3): qty 1

## 2018-12-27 MED ORDER — ONDANSETRON HCL 4 MG PO TABS: 4.0000 mg | ORAL_TABLET | Freq: Four times a day (QID) | ORAL | Status: DC | PRN

## 2018-12-27 MED ORDER — PIPERACILLIN-TAZOBACTAM 3.375 G IVPB 30 MIN
3.3750 g | Freq: Once | INTRAVENOUS | Status: AC
  Administered 2018-12-28: 3.375 g via INTRAVENOUS

## 2018-12-27 MED ORDER — SODIUM CHLORIDE 0.9 % IV BOLUS
500.0000 mL | Freq: Once | INTRAVENOUS | Status: AC
  Administered 2018-12-27: 500 mL via INTRAVENOUS

## 2018-12-27 MED ORDER — PIPERACILLIN-TAZOBACTAM 3.375 G IVPB
3.3750 g | Freq: Three times a day (TID) | INTRAVENOUS | Status: DC
  Administered 2018-12-28 – 2018-12-31 (×11): 3.375 g via INTRAVENOUS
  Filled 2018-12-27 (×13): qty 50

## 2018-12-27 MED ORDER — VITAMIN B-1 100 MG PO TABS: 100.0000 mg | ORAL_TABLET | Freq: Every day | ORAL | Status: DC

## 2018-12-27 MED ORDER — GADOBUTROL 1 MMOL/ML IV SOLN
8.0000 mL | Freq: Once | INTRAVENOUS | Status: AC | PRN
  Administered 2018-12-27: 8 mL via INTRAVENOUS

## 2018-12-27 MED ORDER — VITAMIN K1 10 MG/ML IJ SOLN
5.0000 mg | Freq: Once | INTRAVENOUS | Status: AC
  Administered 2018-12-27: 5 mg via INTRAVENOUS
  Filled 2018-12-27: qty 0.5

## 2018-12-27 MED ORDER — VANCOMYCIN HCL 10 G IV SOLR
1250.0000 mg | Freq: Two times a day (BID) | INTRAVENOUS | Status: DC
  Administered 2018-12-28 – 2018-12-31 (×7): 1250 mg via INTRAVENOUS
  Filled 2018-12-27 (×9): qty 1250

## 2018-12-27 MED ORDER — LORAZEPAM 2 MG/ML IJ SOLN: 0.0000 mg | Freq: Four times a day (QID) | INTRAMUSCULAR | Status: DC

## 2018-12-27 MED ORDER — CHLORDIAZEPOXIDE HCL 25 MG PO CAPS
100.0000 mg | ORAL_CAPSULE | Freq: Once | ORAL | Status: AC
  Administered 2018-12-27: 100 mg via ORAL
  Filled 2018-12-27: qty 4

## 2018-12-27 MED ORDER — THIAMINE HCL 100 MG/ML IJ SOLN
100.0000 mg | Freq: Every day | INTRAMUSCULAR | Status: DC
  Filled 2018-12-27: qty 2

## 2018-12-27 MED ORDER — FOLIC ACID 1 MG PO TABS
1.0000 mg | ORAL_TABLET | Freq: Every day | ORAL | Status: DC
  Administered 2018-12-27 – 2018-12-31 (×5): 1 mg via ORAL
  Filled 2018-12-27 (×5): qty 1

## 2018-12-27 MED ORDER — LORAZEPAM 1 MG PO TABS
1.0000 mg | ORAL_TABLET | Freq: Four times a day (QID) | ORAL | Status: AC | PRN
  Administered 2018-12-28 – 2018-12-30 (×7): 1 mg via ORAL
  Filled 2018-12-27 (×7): qty 1

## 2018-12-27 MED ORDER — LORAZEPAM 2 MG/ML IJ SOLN: 0.0000 mg | Freq: Two times a day (BID) | INTRAMUSCULAR | Status: DC

## 2018-12-27 MED ORDER — ONDANSETRON HCL 4 MG/2ML IJ SOLN: 4.0000 mg | Freq: Four times a day (QID) | INTRAMUSCULAR | Status: DC | PRN

## 2018-12-27 MED ORDER — LORAZEPAM 1 MG PO TABS: 0.0000 mg | ORAL_TABLET | Freq: Four times a day (QID) | ORAL | Status: DC

## 2018-12-27 MED ORDER — MORPHINE SULFATE (PF) 2 MG/ML IV SOLN
1.0000 mg | INTRAVENOUS | Status: DC | PRN
  Administered 2018-12-27 – 2018-12-28 (×3): 1 mg via INTRAVENOUS
  Filled 2018-12-27 (×3): qty 1

## 2018-12-27 MED ORDER — VITAMIN B-1 100 MG PO TABS
100.0000 mg | ORAL_TABLET | Freq: Every day | ORAL | Status: DC
  Administered 2018-12-28 – 2018-12-31 (×4): 100 mg via ORAL
  Filled 2018-12-27 (×4): qty 1

## 2018-12-27 MED ORDER — VANCOMYCIN HCL 10 G IV SOLR
1500.0000 mg | Freq: Once | INTRAVENOUS | Status: AC
  Administered 2018-12-27: 1500 mg via INTRAVENOUS
  Filled 2018-12-27: qty 1500

## 2018-12-27 MED ORDER — LORAZEPAM 2 MG/ML IJ SOLN
2.0000 mg | Freq: Once | INTRAMUSCULAR | Status: AC
  Administered 2018-12-27: 2 mg via INTRAVENOUS
  Filled 2018-12-27: qty 1

## 2018-12-27 MED ORDER — SODIUM CHLORIDE 0.9 % IV SOLN
INTRAVENOUS | Status: DC
  Administered 2018-12-27 – 2018-12-29 (×3): via INTRAVENOUS

## 2018-12-27 MED ORDER — ADULT MULTIVITAMIN W/MINERALS CH
1.0000 | ORAL_TABLET | Freq: Every day | ORAL | Status: DC
  Administered 2018-12-27 – 2018-12-31 (×5): 1 via ORAL
  Filled 2018-12-27 (×5): qty 1

## 2018-12-27 MED ORDER — LORAZEPAM 1 MG PO TABS: 0.0000 mg | ORAL_TABLET | Freq: Two times a day (BID) | ORAL | Status: DC

## 2018-12-27 MED ORDER — MORPHINE SULFATE (PF) 4 MG/ML IV SOLN
4.0000 mg | Freq: Once | INTRAVENOUS | Status: AC
  Administered 2018-12-27: 4 mg via INTRAVENOUS
  Filled 2018-12-27: qty 1

## 2018-12-27 NOTE — Consult Note (Signed)
ORTHOPAEDIC CONSULTATION  REQUESTING PHYSICIAN: Oswald Hillock, MD  Chief Complaint: Ulceration left great toe medial border.  HPI: Leonard Bentley is a 58 y.o. male who presents with acute ulceration left great toe medial border.  Patient is status post chemotherapy for non-Hodgkin's lymphoma.  Has developed a neuropathy from this complains of burning pain in his feet and has developed a blister over the medial border from his insensate neuropathy.  Past Medical History:  Diagnosis Date  . Acute pulmonary embolism ([place]) 04/11/2016  . Anxiety   . Arthritis   . Atrial tachycardia ([place]) 01/06/2016  . Depression   . DTs (delirium tremens) ([place])   . Dyspnea   . ED (erectile dysfunction)   . ETOH abuse   . Hypertension   . Lymphoma, non Hodgkin's 09/25/2011   Stage 2  . Mitral regurgitation 01/20/2016  . Mobitz I 10/09/2015  . Murmur 01/06/2016  . Occasional tremors   . PAC (premature atrial contraction) 10/09/2015  . S/P minimally-invasive mitral valve repair 10/27/2016   Complex valvuloplasty including artificial Gore-tex neochord placement x6 and 34 mm Edwards Physio II ring annuloplasty via right mini thoracotomy approach   Past Surgical History:  Procedure Laterality Date  . CARDIAC CATHETERIZATION N/A 08/13/2016   Procedure: Right/Left Heart Cath and Coronary Angiography;  Surgeon: [name], MD;  Location: [place] [facility];  Service: Cardiovascular;  Laterality: N/A;  . MITRAL VALVE REPAIR Right 10/27/2016   Procedure: MINIMALLY INVASIVE MITRAL VALVE REPAIR (MVR) USING 34 PHYSIO II ANNULOPLASTY RING;  Surgeon: [name], MD;  Location: [place];  Service: Open Heart Surgery;  Laterality: Right;  . SKIN BIOPSY Right 04/04/2018   right mid medial anterior tibial shave  see report in chart  . TEE WITHOUT CARDIOVERSION N/A 02/11/2016   Procedure: TRANSESOPHAGEAL ECHOCARDIOGRAM (TEE);  Surgeon: [name], MD;  Location: [place];  Service: Cardiovascular;   Laterality: N/A;  . TEE WITHOUT CARDIOVERSION N/A 10/27/2016   Procedure: TRANSESOPHAGEAL ECHOCARDIOGRAM (TEE);  Surgeon: [name], MD;  Location: [place];  Service: Open Heart Surgery;  Laterality: N/A;  . TRIGGER FINGER RELEASE Bilateral    Social History   Socioeconomic History  . Marital status: Single    Spouse name: Not on file  . Number of children: Not on file  . Years of education: Not on file  . Highest education level: Not on file  Occupational History  . Occupation: unemployed  Social Needs  . Financial resource strain: Not on file  . Food insecurity:    Worry: Not on file    Inability: Not on file  . Transportation needs:    Medical: Not on file    Non-medical: Not on file  Tobacco Use  . Smoking status: Current Every Hodgkin Smoker    Packs/Cassity: 1.00    Years: 40.00    Pack years: 40.00    Types: Cigarettes, E-cigarettes  . Smokeless tobacco: Former Systems developer    Quit date: 1985  . Tobacco comment: vapes all [place]  Substance and Sexual Activity  . Alcohol use: Yes    Alcohol/week: 0.0 standard drinks    Comment: 1-2 bottles of red wine a [place]  . Drug use: No  . Sexual activity: Not Currently  Lifestyle  . Physical activity:    Days per week: Not on file    Minutes per session: Not on file  . Stress: Not on file  Relationships  . Social connections:    Talks on  phone: Not on file    Gets together: Not on file    Attends religious service: Not on file    Active member of club or organization: Not on file    Attends meetings of clubs or organizations: Not on file    Relationship status: Not on file  Other Topics Concern  . Not on file  Social History Narrative   Epworth sleepiness scale as of 10/09/15 a 1   Family History  Problem Relation Age of Onset  . Cancer Mother        BREAST(BONE)  . Cancer Father        PANCREATIC  . Hypertension Maternal Grandmother   . Stroke Maternal Aunt   . Heart attack Neg Hx    - negative except otherwise stated in  the family history section Allergies  Allergen Reactions  . Other Other (See Comments)    "Cactus" blisters on tongue   Prior to Admission medications   Medication Sig Start Date End Date Taking? Authorizing Provider  acetaminophen (TYLENOL) 650 MG CR tablet Take 650 mg by mouth every 8 (eight) hours as needed for pain.   Yes [provider]  warfarin (COUMADIN) 5 MG tablet Take 5 mg by mouth daily.    Yes [provider]  traMADol (ULTRAM) 50 MG tablet Take 1 tablet (50 mg total) by mouth every 6 (six) hours as needed for moderate pain. Patient not taking: Reported on 12/27/2018 12/10/18   Aline August, MD   Dg Toe Great Left  Result Date: 12/27/2018 CLINICAL DATA:  necrotic great toe, please eval for possible osteomyelitis/gasPatient reports pain in left great toe for 3 weeks. Patient states it started as blister along dorsal surface of great toe 3 weeks ago, he placed a band-aid on it and 2 weeks ago his distal great toe started to turn black. Pain is localized to great toe and does not radiate into rest of foot. No prior surgery or injury to great toe. EXAM: LEFT GREAT TOE COMPARISON:  12/07/2018 FINDINGS: There is no evidence of fracture or dislocation. No focal cortical erosion to suggest osteomyelitis. There is no evidence of arthropathy or other focal bone abnormality. Soft tissue swelling about the distal phalanx. No subcutaneous gas or radiodense foreign body. IMPRESSION: 1. Soft tissue swelling without bony abnormality. 2. No radiographic evidence of osteomyelitis. The Electronically Signed   By: Lucrezia Europe M.D.   On: 12/27/2018 14:55   - pertinent xrays, CT, MRI studies were reviewed and independently interpreted  Positive ROS: All other systems have been reviewed and were otherwise negative with the exception of those mentioned in the HPI and as above.  Physical Exam: General: Alert, no acute distress Psychiatric: Patient is competent for consent with normal mood  and affect Lymphatic: No axillary or cervical lymphadenopathy Cardiovascular: No pedal edema Respiratory: No cyanosis, no use of accessory musculature GI: No organomegaly, abdomen is soft and non-tender    Images:  @ENCIMAGES @  Labs:  Lab Results  Component Value Date   HGBA1C 5.2 10/25/2016   HGBA1C 6.1 (H) 04/03/2016   HGBA1C 5.9 (H) 08/18/2015   LABURIC 5.2 12/09/2018   LABURIC 7.4 08/18/2015   LABURIC 4.1 12/01/2011   REPTSTATUS 12/12/2018 FINAL 12/07/2018   CULT  12/07/2018    NO GROWTH 5 DAYS Performed at [facility], [place] [address], [place], [place] [address]     Lab Results  Component Value Date   ALBUMIN 4.0 12/27/2018   ALBUMIN 3.0 (L)  12/09/2018   ALBUMIN 3.5 12/07/2018   LABURIC 5.2 12/09/2018   LABURIC 7.4 08/18/2015   LABURIC 4.1 12/01/2011    Neurologic: Patient does not have protective sensation bilateral lower extremities.   MUSCULOSKELETAL:   Skin: Examination patient has a blood blister over the medial border of the left great toe.  After informed consent this was unroofed and patient has some ischemic changes beneath the blood blister but there is no cellulitis no abscess no exposed bone or tendon.  Patient has palpable dorsalis pedis pulses bilaterally.  Patient does not have sausage digit swelling of the toe there is no tenderness to palpation no clinical signs of infection.  Review of the radiographs shows no signs of osteomyelitis.  Patient is on vancomycin and Zosyn and an MRI scan is ordered.  Assessment: Assessment: Insensate neuropathy bilateral feet secondary to chemotherapy for non-Hodgkin's lymphoma with a blood blister over the medial border of the left great toe with ischemic soft tissue changes.  Plan: Plan: I will write orders for a postoperative shoe patient may be weightbearing as tolerated I will follow-up as an outpatient.  I will review the MRI scan.  Anticipate patient could be discharged on oral antibiotics after a  few days of IV antibiotics.  Thank you for the consult and the opportunity to see Mr. [place], MD [facility] [phone] 5:44 PM

## 2018-12-27 NOTE — Progress Notes (Signed)
Pharmacy Antibiotic Note  Leonard Bentley is a 58 y.o. male admitted on 12/27/2018 with cellulitis.  Pharmacy has been consulted for vancomycin and zosyn dosing. Pt is afebrile and WBc is WNL. SCr is WNL.   Plan: Vancomycin 1500mg  IV x 1 then 1250mg  IV Q12H Zosyn 3.375gm IV Q8H (4 hr inf) F/u renal fxn, C&S, clinical status and peak/trough at SS  Height: 5' 9.5" (176.5 cm) Weight: 187 lb 12.8 oz (85.2 kg) IBW/kg (Calculated) : 71.85  Temp (24hrs), Avg:98.2 F (36.8 C), Min:98.2 F (36.8 C), Max:98.2 F (36.8 C)  Recent Labs  Lab 12/27/18 1306  WBC 5.1  CREATININE 0.89    Estimated Creatinine Clearance: 90.9 mL/min (by C-G formula based on SCr of 0.89 mg/dL).    Allergies  Allergen Reactions  . Other Other (See Comments)    "Cactus" blisters on tongue    Antimicrobials this admission: Vanc 2/19>> Zosyn 2/19>>  Dose adjustments this admission: N/A  Microbiology results: Pending  Thank you for allowing pharmacy to be a part of this patient's care.  Pragya Lofaso, Rande Lawman 12/27/2018 4:35 PM

## 2018-12-27 NOTE — Plan of Care (Signed)

## 2018-12-27 NOTE — Progress Notes (Addendum)
Chief Complaint  Patient presents with  . Wound Infection    left foot, great toe being the worst. He states that both feet are numb. Symptoms started about 3 weeks ago. Has been using Athlete's Foot spray but not working, tramadol not helping.  . Other    would like to know if he can get his INR done here today.     Admitted 2/2 with left great toe cellulitis and alcohol withdrawal.  Treated with IV ABX. Was discharged on Keflex. He reports he completed the antibiotic course, reports it never got completely better. He missed a couple of appointments last week, hasn't been seen since hospital discharge.   2 weeks ago noticed a flap of skin on the top of the left great toe--since then has gotten worse.  He is in a lot of pain, related to his toe, and across the top of both feet.  He doesn't have any feeling in his toes.  Alcohol--1 bottle of wine/night.  He is on warfarin, taking 5mg  daily. He denies bleeding. Lab Results  Component Value Date   INR 1.76 12/10/2018   INR 2.42 12/09/2018   INR 4.07 ([place]) 12/08/2018   PMH, PSH, SH reviewed  Outpatient Encounter Medications as of 12/27/2018  Medication Sig  . warfarin (COUMADIN) 5 MG tablet Take 5 mg by mouth daily. Take 1/2 tablet by mouth every Sun Take 1 tablet by mouth all other days   . [DISCONTINUED] folic acid (FOLVITE) 1 MG tablet Take 1 tablet (1 mg total) by mouth daily.  Marland Kitchen acetaminophen (TYLENOL) 650 MG CR tablet Take 650 mg by mouth every 8 (eight) hours as needed for pain.  . traMADol (ULTRAM) 50 MG tablet Take 1 tablet (50 mg total) by mouth every 6 (six) hours as needed for moderate pain. (Patient not taking: Reported on 12/27/2018)  . [DISCONTINUED] carbamide peroxide (DEBROX) 6.5 % OTIC solution Place 5 drops into the left ear 2 (two) times daily.  . [DISCONTINUED] Multiple Vitamin (MULTIVITAMIN WITH MINERALS) TABS tablet Take 1 tablet by mouth daily.  . [DISCONTINUED] thiamine 100 MG tablet Take 1 tablet (100 mg total)  by mouth daily.   No facility-administered encounter medications on file as of 12/27/2018.    Not taking MVI, folate, thiamine  Allergies  Allergen Reactions  . Other Other (See Comments)    "Cactus" blisters on tongue    ROS:  Subjective fever/chills. +pain in foot, as well as numbness. Some nausea, decreased appetite.  Eating once/[place]. Denies shortness of breath, chest pain.  PHYSICAL EXAM:  BP 110/80   Pulse 80   Temp 98.2 F (36.8 C) (Tympanic)   Ht 5' 9.5" (1.765 m)   Wt 187 lb 12.8 oz (85.2 kg)   BMI 27.34 kg/m   Tremulous patient, in moderate distress due to pain. During visit he report he felt like he was going to pass out-- BP was 66 on recheck. Pulse 90. Felt better laying supine, with cool compress. 97% oxygen saturation  Left great toe has ulcer over proximal phalanx, with some soft tissue swelling and erythema.  Slight swelling and erythema of the left foot noted. There is also callous present medially on great toe, dark in color. Heart: regular rate and rhythm Lungs: clear Neuro: patient is alert and oriented.  He is tremulous in upper and lower extremities.  He has decreased sensation over his feet.  Tender around the ulcer. 2+ pulses noted  ASSESSMENT/PLAN:  Skin ulcer of left great toe, unspecified ulcer  stage ([place])  Cellulitis of foot  Hypotensive episode  Current use of long term anticoagulation  Alcohol abuse   Paramedics called to transport pt to ED

## 2018-12-27 NOTE — Progress Notes (Signed)
Orthopedic Tech Progress Note Patient Details:  Snyder Colavito Swinger 1961/02/19 [id number]  Ortho Devices Type of Ortho Device: Postop shoe/boot Ortho Device/Splint Interventions: Ordered, Application, Adjustment   Post Interventions Patient Tolerated: Well Instructions Provided: Care of device   Jadynn Epping J Jeimy Bickert 12/27/2018, 6:47 PM

## 2018-12-27 NOTE — ED Provider Notes (Signed)
[facility] EMERGENCY DEPARTMENT Provider Note   CSN: [id number] Arrival date & time: 12/27/18  1244    History   Chief Complaint Chief Complaint  Patient presents with  . Seizures  . Loss of Consciousness  . Wound Infection    HPI Leonard Bentley is a 58 y.o. male with a pmhx of PE on coumadin, ETOH abuse with DTs, lymphoma s/p chemotherapy, HTN, MVP s/p mitral valve repair 2017, who presented to the ED sent by his PCP for gangrenous L great toe and hypotension. Pt reports that about 3 weeks ago he noticed a "white spot" on his toe which then progressed to black tissue and ulceration which spread over his toe. He endorses subjective fevers and chills. Denies any prior trauma or injury. He saw his PCP today and was hypotensive in clinic so he was sent to ED for further workup. While in triage he had witnessed seizure like activity x2. He has been persistently tremulous sand tachycardic since that time. Denies any chest pain, vision changes. Last ETOH use was last night. Denies prior seizure activity.     HPI  Past Medical History:  Diagnosis Date  . Acute pulmonary embolism ([place]) 04/11/2016  . Anxiety   . Arthritis   . Atrial tachycardia ([employer]) 01/06/2016  . Depression   . DTs (delirium tremens) ([place])   . Dyspnea   . ED (erectile dysfunction)   . ETOH abuse   . Hypertension   . Lymphoma, non Hodgkin's 09/25/2011   Stage 2  . Mitral regurgitation 01/20/2016  . Mobitz I 10/09/2015  . Murmur 01/06/2016  . Occasional tremors   . PAC (premature atrial contraction) 10/09/2015  . S/P minimally-invasive mitral valve repair 10/27/2016   Complex valvuloplasty including artificial Gore-tex neochord placement x6 and 34 mm Edwards Physio II ring annuloplasty via right mini thoracotomy approach    Patient Active Problem List   Diagnosis Date Noted  . Cellulitis 12/07/2018  . Bleeding from left ear 11/28/2018  . HTN (hypertension) 11/02/2018  . UTI (urinary tract infection)  11/02/2018  . Ecchymosis   . Tachyarrhythmia 08/30/2018  . Hypomagnesemia 08/30/2018  . Recurrent pulmonary emboli ([place])   . Hematuria   . Delirium tremens ([place])   . LFTs abnormal   . Alcoholic hepatitis without ascites   . Alcohol withdrawal ([place]) 06/04/2018  . Dizziness   . Posterior tibial tendon dysfunction 02/21/2018  . Supratherapeutic INR 11/27/2016  . Dehydration 11/27/2016  . SVT (supraventricular tachycardia) ([place])   . S/P minimally-invasive mitral valve repair 10/27/2016  . History of pulmonary embolus (PE) 04/11/2016  . Left sided numbness   . Anxiety 04/02/2016  . Panic attacks 04/02/2016  . Mitral valve prolapse   . Mitral regurgitation 01/20/2016  . Atrial tachycardia ([place]) 01/06/2016  . Murmur 01/06/2016  . Mobitz I 10/09/2015  . PAC (premature atrial contraction) 10/09/2015  . Arthritis 08/14/2015  . Neuropathy due to chemotherapeutic drug ([place]) 03/09/2013  . Leucopenia 01/31/2013  . Personal history of pulmonary embolism 12/28/2012  . Alcohol abuse 08/24/2012  . Cigarette smoker 08/24/2012  . Hemorrhoid 03/08/2012  . Anemia 12/10/2011  . History of non-Hodgkin's lymphoma 09/25/2011    Past Surgical History:  Procedure Laterality Date  . CARDIAC CATHETERIZATION N/A 08/13/2016   Procedure: Right/Left Heart Cath and Coronary Angiography;  Surgeon: [name], MD;  Location: [facility];  Service: Cardiovascular;  Laterality: N/A;  . MITRAL VALVE REPAIR Right 10/27/2016   Procedure: MINIMALLY INVASIVE MITRAL VALVE  REPAIR (MVR) USING 34 PHYSIO II ANNULOPLASTY RING;  Surgeon: [name], MD;  Location: [facility];  Service: Open Heart Surgery;  Laterality: Right;  . SKIN BIOPSY Right 04/04/2018   right mid medial anterior tibial shave  see report in chart  . TEE WITHOUT CARDIOVERSION N/A 02/11/2016   Procedure: TRANSESOPHAGEAL ECHOCARDIOGRAM (TEE);  Surgeon: [name], MD;  Location: [facility];  Service: Cardiovascular;  Laterality:  N/A;  . TEE WITHOUT CARDIOVERSION N/A 10/27/2016   Procedure: TRANSESOPHAGEAL ECHOCARDIOGRAM (TEE);  Surgeon: [name], MD;  Location: [facility];  Service: Open Heart Surgery;  Laterality: N/A;  . TRIGGER FINGER RELEASE Bilateral         Home Medications    Prior to Admission medications   Medication Sig Start Date End Date Taking? Authorizing Provider  acetaminophen (TYLENOL) 650 MG CR tablet Take 650 mg by mouth every 8 (eight) hours as needed for pain.   Yes [provider]  warfarin (COUMADIN) 5 MG tablet Take 5 mg by mouth daily.    Yes [provider]  traMADol (ULTRAM) 50 MG tablet Take 1 tablet (50 mg total) by mouth every 6 (six) hours as needed for moderate pain. Patient not taking: Reported on 12/27/2018 12/10/18   Aline August, MD    Family History Family History  Problem Relation Age of Onset  . Cancer Mother        BREAST(BONE)  . Cancer Father        PANCREATIC  . Hypertension Maternal Grandmother   . Stroke Maternal Aunt   . Heart attack Neg Hx     Social History Social History   Tobacco Use  . Smoking status: Current Every Price Smoker    Packs/[place]: 1.00    Years: 40.00    Pack years: 40.00    Types: Cigarettes, E-cigarettes  . Smokeless tobacco: Former Systems developer    Quit date: 1985  . Tobacco comment: vapes all [place]  Substance Use Topics  . Alcohol use: Yes    Alcohol/week: 0.0 standard drinks    Comment: 1-2 bottles of red wine a [place]  . Drug use: No     Allergies   Other   Review of Systems Review of Systems  All other systems reviewed and are negative.    Physical Exam Updated Vital Signs BP 110/90   Pulse (!) 113   Resp 18   Ht 5' 9.5" (1.765 m)   Wt 85.2 kg   SpO2 99%   BMI 27.34 kg/m   Physical Exam Vitals signs and nursing note reviewed.  Constitutional:      General: He is not in acute distress.    Appearance: He is well-developed. He is ill-appearing. He is not diaphoretic.     Comments: tremulous    HENT:     Head: Normocephalic and atraumatic.     Mouth/Throat:     Pharynx: No oropharyngeal exudate.  Eyes:     General: No scleral icterus.       Right eye: No discharge.        Left eye: No discharge.     Conjunctiva/sclera: Conjunctivae normal.     Pupils: Pupils are equal, round, and reactive to light.  Cardiovascular:     Rate and Rhythm: Regular rhythm. Tachycardia present.     Heart sounds: Normal heart sounds. No murmur. No friction rub. No gallop.   Pulmonary:     Effort: Pulmonary effort is normal. No respiratory distress.     Breath  sounds: Normal breath sounds. No wheezing or rales.  Chest:     Chest wall: No tenderness.  Abdominal:     General: There is no distension.     Palpations: Abdomen is soft.     Tenderness: There is no abdominal tenderness. There is no guarding.  Musculoskeletal: Normal range of motion.     Right lower leg: Edema present.     Left lower leg: Edema present.  Skin:    General: Skin is warm and dry.     Coloration: Skin is not pale.     Findings: No erythema or rash.     Comments: Black tissue with superficial ulceration of left great toe  Neurological:     Mental Status: He is alert and oriented to person, place, and time.  Psychiatric:        Behavior: Behavior normal.      ED Treatments / Results  Labs (all labs ordered are listed, but only abnormal results are displayed) Labs Reviewed  CBC WITH DIFFERENTIAL/PLATELET - Abnormal; Notable for the following components:      Result Value   Hemoglobin 12.6 (*)    HCT 38.6 (*)    RDW 16.4 (*)    Platelets 139 (*)    nRBC 1.4 (*)    All other components within normal limits  COMPREHENSIVE METABOLIC PANEL - Abnormal; Notable for the following components:   CO2 20 (*)    Glucose, Bld 108 (*)    AST 85 (*)    ALT 52 (*)    Anion gap 16 (*)    All other components within normal limits  LIPASE, BLOOD - Abnormal; Notable for the following components:   Lipase 85 (*)    All other  components within normal limits  PROTIME-INR - Abnormal; Notable for the following components:   Prothrombin Time >90.0 (*)    INR >10.00 (*)    All other components within normal limits  ETHANOL - Abnormal; Notable for the following components:   Alcohol, Ethyl (B) 54 (*)    All other components within normal limits    EKG EKG Interpretation  Date/Time:  Wednesday December 27 2018 13:44:08 EST Ventricular Rate:  93 PR Interval:    QRS Duration: 96 QT Interval:  369 QTC Calculation: 459 R Axis:   -26 Text Interpretation:  Atrial fibrillation Borderline left axis deviation Baseline wander in lead(s) V2 No significant change since last tracing Confirmed by Deno Etienne [phone]) on 12/27/2018 2:34:50 PM   Radiology Dg Toe Great Left  Result Date: 12/27/2018 CLINICAL DATA:  necrotic great toe, please eval for possible osteomyelitis/gasPatient reports pain in left great toe for 3 weeks. Patient states it started as blister along dorsal surface of great toe 3 weeks ago, he placed a band-aid on it and 2 weeks ago his distal great toe started to turn black. Pain is localized to great toe and does not radiate into rest of foot. No prior surgery or injury to great toe. EXAM: LEFT GREAT TOE COMPARISON:  12/07/2018 FINDINGS: There is no evidence of fracture or dislocation. No focal cortical erosion to suggest osteomyelitis. There is no evidence of arthropathy or other focal bone abnormality. Soft tissue swelling about the distal phalanx. No subcutaneous gas or radiodense foreign body. IMPRESSION: 1. Soft tissue swelling without bony abnormality. 2. No radiographic evidence of osteomyelitis. The Electronically Signed   By: Lucrezia Europe M.D.   On: 12/27/2018 14:55    Procedures Procedures (including critical care time)  Medications Ordered in ED Medications  LORazepam (ATIVAN) injection 0-4 mg (has no administration in time range)    Or  LORazepam (ATIVAN) tablet 0-4 mg (has no administration in time  range)  LORazepam (ATIVAN) injection 0-4 mg (has no administration in time range)    Or  LORazepam (ATIVAN) tablet 0-4 mg (has no administration in time range)  thiamine (VITAMIN B-1) tablet 100 mg ( Oral See Alternative 12/27/18 1505)    Or  thiamine (B-1) injection 100 mg (100 mg Intravenous Given 12/27/18 1505)  sodium chloride 0.9 % bolus 500 mL (500 mLs Intravenous New Bag/Given 12/27/18 1506)  morphine 4 MG/ML injection 4 mg (has no administration in time range)  LORazepam (ATIVAN) injection 2 mg (2 mg Intravenous Given 12/27/18 1336)  chlordiazePOXIDE (LIBRIUM) capsule 100 mg (100 mg Oral Given 12/27/18 1505)     Initial Impression / Assessment and Plan / ED Course  I have reviewed the triage vital signs and the nursing notes.  Pertinent labs & imaging results that were available during my care of the patient were reviewed by me and considered in my medical decision making (see chart for details).        58 y.o M with pmhx of B cell lymphoma s/p chemo, ETOh abuse w/ hx DTs, PE on coumadin, who presented to the ED sent by PCP for necrotic/gangrenous Left great toe and hypotension in office today. While in triage he had witnessed seizure like activity. He remains tachycardic and very tremulous. ?possible withdrawal seizure. He was started on CIWA protocol and given ativan and librium. Last ETOh drink was last night.   No leukocytosos. He does have sequelae of liver disease including mild thrombocytopenia. INR >10 which I suspect is most likely driven by his coumadin although could be exacerbated in setting of liver disease. Last abdominal imaging was from 05/2018 CT at which time his liver was described as hepatic steatosis. On CT A/p from 09/2017 multiple low density lesions were also seen in the liver. AST slightly >ALt 2/2 ETOH use.    He was recently admitted from 1/30-2/2 for cellulitis of same toe and received IV antibiotics and was discharged with OP follow up. Toe now necrotic.Xray  of toe shows soft tissue abnormality no radiographic evidence of osteomyelitis. However, clinically it does appear to be necrotic and I suspect that he may need more urgent amputation. Will consult hospitalist for admission.  Spoke with hospitalist who will admit pt.   Final Clinical Impressions(s) / ED Diagnoses   Final diagnoses:  None    ED Discharge Orders    None       Mahaila Tischer, Dondra Spry, PA-C 12/27/18 [place], [place], DO 12/29/18 2307

## 2018-12-27 NOTE — H&P (Addendum)
TRH H&P    Patient Demographics:    Leonard Bentley, is a 58 y.o. male  MRN: [id number]  DOB - 1961-06-24  Admit Date - 12/27/2018  Referring MD/NP/PA: Donnald Garre  Outpatient Primary MD for the patient is [name], MD  Patient coming from: Home  Chief complaint-left foot pain   HPI:    Leonard Bentley  is a 58 y.o. male, with a history of pulmonary embolism on anticoagulation with Coumadin, EtOH abuse with DTs, lymphoma status post chemotherapy, hypertension mitral prolapse status post mitral valve repair 2017 went to his PCP with gangrenous-looking left big toe.  Patient says that he was discharged on antibiotics last month after he was diagnosed with cellulitis of big toe.  Patient completed his antibiotics, but continued to have worsening of infection involving left big toe.  He was sent to the ED by his PCP.  In the triage patient had a witnessed seizure-like activity x2.  He does drink 1 bottle of wine every night for past 30 years.  No previous history of seizures. He denies chest pain or shortness of breath. Denies  vomiting or diarrhea. Does complain of nausea. No history of fever or chills. Denies dysuria.  In the ED x-ray of the foot showed soft tissue swelling involving the left big toe but no osteomyelitis.    Review of systems:    In addition to the HPI above,   All other systems reviewed and are negative.    Past History of the following :    Past Medical History:  Diagnosis Date  . Acute pulmonary embolism ([place]) 04/11/2016  . Anxiety   . Arthritis   . Atrial tachycardia ([place]) 01/06/2016  . Depression   . DTs (delirium tremens) ([place])   . Dyspnea   . ED (erectile dysfunction)   . ETOH abuse   . Hypertension   . Lymphoma, non Hodgkin's 09/25/2011   Stage 2  . Mitral regurgitation 01/20/2016  . Mobitz I 10/09/2015  . Murmur 01/06/2016  . Occasional tremors   . PAC (premature  atrial contraction) 10/09/2015  . S/P minimally-invasive mitral valve repair 10/27/2016   Complex valvuloplasty including artificial Gore-tex neochord placement x6 and 34 mm Edwards Physio II ring annuloplasty via right mini thoracotomy approach      Past Surgical History:  Procedure Laterality Date  . CARDIAC CATHETERIZATION N/A 08/13/2016   Procedure: Right/Left Heart Cath and Coronary Angiography;  Surgeon: [name], MD;  Location: [facility];  Service: Cardiovascular;  Laterality: N/A;  . MITRAL VALVE REPAIR Right 10/27/2016   Procedure: MINIMALLY INVASIVE MITRAL VALVE REPAIR (MVR) USING 34 PHYSIO II ANNULOPLASTY RING;  Surgeon: [name], MD;  Location: [facility];  Service: Open Heart Surgery;  Laterality: Right;  . SKIN BIOPSY Right 04/04/2018   right mid medial anterior tibial shave  see report in chart  . TEE WITHOUT CARDIOVERSION N/A 02/11/2016   Procedure: TRANSESOPHAGEAL ECHOCARDIOGRAM (TEE);  Surgeon: [name], MD;  Location: [facility];  Service: Cardiovascular;  Laterality: N/A;  . TEE  WITHOUT CARDIOVERSION N/A 10/27/2016   Procedure: TRANSESOPHAGEAL ECHOCARDIOGRAM (TEE);  Surgeon: [name], MD;  Location: [facility];  Service: Open Heart Surgery;  Laterality: N/A;  . TRIGGER FINGER RELEASE Bilateral       Social History:      Social History   Tobacco Use  . Smoking status: Current Every [place] Smoker    Packs/Kimble: 1.00    Years: 40.00    Pack years: 40.00    Types: Cigarettes, E-cigarettes  . Smokeless tobacco: Former Systems developer    Quit date: 1985  . Tobacco comment: vapes all [employer]  Substance Use Topics  . Alcohol use: Yes    Alcohol/week: 0.0 standard drinks    Comment: 1-2 bottles of red wine a [place]       Family History :     Family History  Problem Relation Age of Onset  . Cancer Mother        BREAST(BONE)  . Cancer Father        PANCREATIC  . Hypertension Maternal Grandmother   . Stroke Maternal Aunt   . Heart attack Neg Hx         Home Medications:   Prior to Admission medications   Medication Sig Start Date End Date Taking? Authorizing Provider  acetaminophen (TYLENOL) 650 MG CR tablet Take 650 mg by mouth every 8 (eight) hours as needed for pain.   Yes [provider]  warfarin (COUMADIN) 5 MG tablet Take 5 mg by mouth daily.    Yes [provider]  traMADol (ULTRAM) 50 MG tablet Take 1 tablet (50 mg total) by mouth every 6 (six) hours as needed for moderate pain. Patient not taking: Reported on 12/27/2018 12/10/18   Aline August, MD     Allergies:     Allergies  Allergen Reactions  . Other Other (See Comments)    "Cactus" blisters on tongue     Physical Exam:   Vitals  Blood pressure 129/83, pulse (!) 111, resp. rate 14, height 5' 9.5" (1.765 m), weight 85.2 kg, SpO2 98 %.  1.  General: Appears in no acute distress  2. Psychiatric: Alert, oriented x3, intact insight and judgment  3. Neurologic: Cranial nerves II through XII are grossly intact, motor strength 5/5 in all extremities  4. HEENMT:  Atraumatic normocephalic, extraocular muscle intact, oral mucosa is moist.  5. Respiratory : Clear to auscultation bilaterally, no wheezing or crackles.  6. Cardiovascular : S1-S2, regular, no murmur auscultated.  Peripheral pulses are palpable bilaterally.  7. Gastrointestinal:  Abdomen is soft, nontender, no organomegaly  8. Skin:  Large ulcer noted on the dorsal aspect of left big toe, tip of the left big toe is blackish discoloration.     Data Review:    CBC Recent Labs  Lab 12/27/18 1306  WBC 5.1  HGB 12.6*  HCT 38.6*  PLT 139*  MCV 82.0  MCH 26.8  MCHC 32.6  RDW 16.4*  LYMPHSABS 0.9  MONOABS 0.6  EOSABS 0.0  BASOSABS 0.0   ------------------------------------------------------------------------------------------------------------------  Results for orders placed or performed during the hospital encounter of 12/27/18 (from the past 48 hour(s))  CBC  with Differential     Status: Abnormal   Collection Time: 12/27/18  1:06 PM  Result Value Ref Range   WBC 5.1 4.0 - 10.5 K/uL   RBC 4.71 4.22 - 5.81 MIL/uL   Hemoglobin 12.6 (L) 13.0 - 17.0 g/dL   HCT 38.6 (L) 39.0 - 52.0 %   MCV 82.0  80.0 - 100.0 fL   MCH 26.8 26.0 - 34.0 pg   MCHC 32.6 30.0 - 36.0 g/dL   RDW 16.4 (H) 11.5 - 15.5 %   Platelets 139 (L) 150 - 400 K/uL   nRBC 1.4 (H) 0.0 - 0.2 %   Neutrophils Relative % 68 %   Neutro Abs 3.6 1.7 - 7.7 K/uL   Lymphocytes Relative 17 %   Lymphs Abs 0.9 0.7 - 4.0 K/uL   Monocytes Relative 12 %   Monocytes Absolute 0.6 0.1 - 1.0 K/uL   Eosinophils Relative 1 %   Eosinophils Absolute 0.0 0.0 - 0.5 K/uL   Basophils Relative 1 %   Basophils Absolute 0.0 0.0 - 0.1 K/uL   Immature Granulocytes 1 %   Abs Immature Granulocytes 0.03 0.00 - 0.07 K/uL    Comment: Performed at [facility] [address] [place], [place] [address]  Comprehensive metabolic panel     Status: Abnormal   Collection Time: 12/27/18  1:06 PM  Result Value Ref Range   Sodium 138 135 - 145 mmol/L   Potassium 4.6 3.5 - 5.1 mmol/L   Chloride 102 98 - 111 mmol/L   CO2 20 (L) 22 - 32 mmol/L   Glucose, Bld 108 (H) 70 - 99 mg/dL   BUN 9 6 - 20 mg/dL   Creatinine, Ser 0.89 0.61 - 1.24 mg/dL   Calcium 9.2 8.9 - 10.3 mg/dL   Total Protein 7.3 6.5 - 8.1 g/dL   Albumin 4.0 3.5 - 5.0 g/dL   AST 85 (H) 15 - 41 U/L   ALT 52 (H) 0 - 44 U/L   Alkaline Phosphatase 54 38 - 126 U/L   Total Bilirubin 0.5 0.3 - 1.2 mg/dL   GFR calc non Af Amer >60 >60 mL/min   GFR calc Af Amer >60 >60 mL/min   Anion gap 16 (H) 5 - 15    Comment: Performed at [facility] [address], [place], [place] [address]  Lipase, blood     Status: Abnormal   Collection Time: 12/27/18  1:06 PM  Result Value Ref Range   Lipase 85 (H) 11 - 51 U/L    Comment: Performed at [facility] [address] [place], [place] [address]  Protime-INR     Status: Abnormal   Collection Time:  12/27/18  1:06 PM  Result Value Ref Range   Prothrombin Time >90.0 (H) 11.4 - 15.2 seconds    Comment: RESULT REPEATED AND VERIFIED   INR >10.00 (HH)     Comment: RESULT REPEATED AND VERIFIED CRITICAL RESULT CALLED TO, READ BACK BY AND VERIFIED WITH: A.JASPER,RN 1427 12/27/2018 CLARK,S Performed at [facility] [address], [place], [place] [address]   Ethanol     Status: Abnormal   Collection Time: 12/27/18  1:06 PM  Result Value Ref Range   Alcohol, Ethyl (B) 54 (H) <10 mg/dL    Comment: (NOTE) Lowest detectable limit for serum alcohol is 10 mg/dL. For medical purposes only. Performed at [facility], [place] [address] [place], [place] [address]     Chemistries  Recent Labs  Lab 12/27/18 1306  NA 138  K 4.6  CL 102  CO2 20*  GLUCOSE 108*  BUN 9  CREATININE 0.89  CALCIUM 9.2  AST 85*  ALT 52*  ALKPHOS 54  BILITOT 0.5   ------------------------------------------------------------------------------------------------------------------  ------------------------------------------------------------------------------------------------------------------ GFR: Estimated Creatinine Clearance: 90.9 mL/min (by C-G formula based on  SCr of 0.89 mg/dL). Liver Function Tests: Recent Labs  Lab 12/27/18 1306  AST 85*  ALT 52*  ALKPHOS 54  BILITOT 0.5  PROT 7.3  ALBUMIN 4.0   Recent Labs  Lab 12/27/18 1306  LIPASE 85*   No results for input(s): AMMONIA in the last 168 hours. Coagulation Profile: Recent Labs  Lab 12/27/18 1306  INR >10.00*    --------------------------------------------------------------------------------------------------------------- Urine analysis:    Component Value Date/Time   COLORURINE RED (A) 11/01/2018 2149   APPEARANCEUR TURBID (A) 11/01/2018 2149   LABSPEC 1.018 11/01/2018 2149   PHURINE 7.0 11/01/2018 2149   GLUCOSEU NEGATIVE 11/01/2018 2149   HGBUR MODERATE (A) 11/01/2018 2149   BILIRUBINUR NEGATIVE 11/01/2018 2149     [place] NEGATIVE 11/01/2018 2149   PROTEINUR 100 (A) 11/01/2018 2149   UROBILINOGEN 1.0 01/25/2013 0825   NITRITE NEGATIVE 11/01/2018 2149   LEUKOCYTESUR NEGATIVE 11/01/2018 2149      Imaging Results:    Dg Toe Great Left  Result Date: 12/27/2018 CLINICAL DATA:  necrotic great toe, please eval for possible osteomyelitis/gasPatient reports pain in left great toe for 3 weeks. Patient states it started as blister along dorsal surface of great toe 3 weeks ago, he placed a band-aid on it and 2 weeks ago his distal great toe started to turn black. Pain is localized to great toe and does not radiate into rest of foot. No prior surgery or injury to great toe. EXAM: LEFT GREAT TOE COMPARISON:  12/07/2018 FINDINGS: There is no evidence of fracture or dislocation. No focal cortical erosion to suggest osteomyelitis. There is no evidence of arthropathy or other focal bone abnormality. Soft tissue swelling about the distal phalanx. No subcutaneous gas or radiodense foreign body. IMPRESSION: 1. Soft tissue swelling without bony abnormality. 2. No radiographic evidence of osteomyelitis. The Electronically Signed   By: Lucrezia Europe M.D.   On: 12/27/2018 14:55    My personal review of EKG: Rhythm NSR   Assessment & Plan:    Active Problems:   Foot ulcer, left ([place])   1. Infected ulcer left big toe-x-ray of the left foot shows no osteomyelitis.  Patient does not have history of diabetes mellitus.  Will start vancomycin and Zosyn.  Patient does have developing gangrene of the left big toe, will consult orthopedic surgery.  Patient does have palpable pulses bilaterally.  Will obtain MRI of the foot.  2. Seizure-alcohol withdrawal-patient had back-to-back seizures in the triage, he does have longstanding history of alcohol use.  Patient drinks 1 bottle of wine every night.  Will start CIWA protocol.  3. Supratherapeutic INR-patient is on anticoagulation with Coumadin for pulmonary embolism.  INR greater than  10.0.  Will give vitamin K 5 mg IV x1.  If Ortho decides to take patient for surgery, patient will require fresh frozen plasma.  4. History of pulmonary embolism-patient on Coumadin as above, which is currently on hold.    DVT Prophylaxis-   Lovenox   AM Labs Ordered, also please review Full Orders  Family Communication: Admission, patients condition and plan of care including tests being ordered have been discussed with the patient  who indicate understanding and agree with the plan and Code Status.  Code Status: Full code  Admission status: Inpatient: Based on patients clinical presentation and evaluation of above clinical data, I have made determination that patient meets Inpatient criteria at this time.  Time spent in minutes : 50 min   Oswald Hillock M.D on 12/27/2018 at 4:29 PM

## 2018-12-27 NOTE — ED Triage Notes (Addendum)
Pt presents with left foot and toe pain for the past 3 weeks. Pt has an open would to the left great toe and escar. Pt was at the doctor's office when 9-1-1 was called for transport.   While sitting in the triage chair pt has a syncopal episode and started to complain of nausea with some dry heaving. Pt started to stare off into the distance. Shortly after pt had a left sided gaze and began to seize, tonic clonic, for 30-45 seconds. Pt regained consciousness and stopped seizing. Pt was able to answer questions. Pt was hypotensive as noted. Pt then again started to have a tonic clonic seizure as witnessed by multiple staff members. Pt urinated on himself and was immediately transferred back to [facility].

## 2018-12-28 ENCOUNTER — Inpatient Hospital Stay (HOSPITAL_COMMUNITY): Payer: Self-pay

## 2018-12-28 DIAGNOSIS — L02619 Cutaneous abscess of unspecified foot: Secondary | ICD-10-CM

## 2018-12-28 DIAGNOSIS — L03119 Cellulitis of unspecified part of limb: Secondary | ICD-10-CM

## 2018-12-28 DIAGNOSIS — Z86711 Personal history of pulmonary embolism: Secondary | ICD-10-CM

## 2018-12-28 DIAGNOSIS — F10129 Alcohol abuse with intoxication, unspecified: Secondary | ICD-10-CM

## 2018-12-28 LAB — COMPREHENSIVE METABOLIC PANEL
ALT: 41 U/L (ref 0–44)
AST: 64 U/L — ABNORMAL HIGH (ref 15–41)
Albumin: 3.4 g/dL — ABNORMAL LOW (ref 3.5–5.0)
Alkaline Phosphatase: 46 U/L (ref 38–126)
Anion gap: 13 (ref 5–15)
BUN: 9 mg/dL (ref 6–20)
CO2: 20 mmol/L — ABNORMAL LOW (ref 22–32)
Calcium: 8.9 mg/dL (ref 8.9–10.3)
Chloride: 106 mmol/L (ref 98–111)
Creatinine, Ser: 0.91 mg/dL (ref 0.61–1.24)
GFR calc Af Amer: >60 mL/min (ref >60)
GFR calc non Af Amer: >60 mL/min (ref >60)
Glucose, Bld: 88 mg/dL (ref 70–99)
Potassium: 4 mmol/L (ref 3.5–5.1)
Sodium: 139 mmol/L (ref 135–145)
Total Bilirubin: 1.4 mg/dL — ABNORMAL HIGH (ref 0.3–1.2)
Total Protein: 5.8 g/dL — ABNORMAL LOW (ref 6.5–8.1)

## 2018-12-28 LAB — CBC
HCT: 31.9 % — ABNORMAL LOW (ref 39.0–52.0)
Hemoglobin: 10.3 g/dL — ABNORMAL LOW (ref 13.0–17.0)
MCH: 25.9 pg — ABNORMAL LOW (ref 26.0–34.0)
MCHC: 32.3 g/dL (ref 30.0–36.0)
MCV: 80.2 fL (ref 80.0–100.0)
Platelets: 144 10*3/uL — ABNORMAL LOW (ref 150–400)
RBC: 3.98 MIL/uL — ABNORMAL LOW (ref 4.22–5.81)
RDW: 15.6 % — ABNORMAL HIGH (ref 11.5–15.5)
WBC: 2.3 10*3/uL — ABNORMAL LOW (ref 4.0–10.5)
nRBC: 2.1 % — ABNORMAL HIGH (ref 0.0–0.2)

## 2018-12-28 LAB — PROTIME-INR
INR: 2.95
Prothrombin Time: 30.3 seconds — ABNORMAL HIGH (ref 11.4–15.2)

## 2018-12-28 MED ORDER — JUVEN PO PACK
1.0000 | PACK | Freq: Two times a day (BID) | ORAL | Status: DC
  Administered 2018-12-29 – 2018-12-31 (×4): 1 via ORAL
  Filled 2018-12-28 (×8): qty 1

## 2018-12-28 MED ORDER — ENSURE MAX PROTEIN PO LIQD
11.0000 [oz_av] | Freq: Every day | ORAL | Status: DC
  Administered 2018-12-28 – 2018-12-30 (×3): 11 [oz_av] via ORAL
  Filled 2018-12-28 (×3): qty 330

## 2018-12-28 MED ORDER — WARFARIN SODIUM 2.5 MG PO TABS
2.5000 mg | ORAL_TABLET | Freq: Once | ORAL | Status: AC
  Administered 2018-12-28: 2.5 mg via ORAL
  Filled 2018-12-28: qty 1

## 2018-12-28 MED ORDER — NICOTINE 21 MG/24HR TD PT24
21.0000 mg | MEDICATED_PATCH | Freq: Every day | TRANSDERMAL | Status: DC
  Administered 2018-12-28 – 2018-12-31 (×4): 21 mg via TRANSDERMAL
  Filled 2018-12-28 (×4): qty 1

## 2018-12-28 NOTE — Progress Notes (Addendum)
Initial Nutrition Assessment  DOCUMENTATION CODES:   Not applicable  INTERVENTION:  Ensure Max, Once a [place]  Juven Fruit Punch BID, each packet provides 80 calories, 8 grams of carbohydrate, and 14 grams of amino acids; supplement contains CaHMB, glutamine, and arginine, to promote wound healing   NUTRITION DIAGNOSIS:   Increased nutrient needs related to wound healing as evidenced by estimated needs.  GOAL:   Patient will meet greater than or equal to 90% of their needs  MONITOR:   PO intake, Supplement acceptance, Weight trends  REASON FOR ASSESSMENT:   Malnutrition Screening Tool(MST 2)    ASSESSMENT:   Pt is a 33y M with PMH of pulmonary embolism (coumadin) EtOH abuse with DTs, Non-Hodgkin's lymphoma s/p chemotherapy with neuropthy, HTN mitral prolapse s/p mitral valve repair 2017, gangrenous-looking L big toe (antibotics). Presented at ED, had a witnessed seizure-like activity x2. Admitted with Ulceration L Great Toe Medial boarder.    Pt is on CIWA protocol. Per chart pt drinks 1 bottle of wine every night for 30y. MD notes withdrawal symptoms, visibly tremulous. No previous history of seizures. No Osteomyelitis per ED x-ray notes.   Pt notes that his appetite has improved to good since being admitted from home, stating, "the food here is good." Pt wasn't very forthcoming about a typical [place]'s diet but states that he eats one solid meal a [place] consisting of seafood/shell fish like squid/calamari or smoked mussels. Pt reports not  really snacking throughout the [place]. Pt had 100% meal completion for lunch today 12/28/18. Pt stated that his appetite was down and lost 'unintentionally' 5# over a month but expressed that it was for personal/emotional reasons but wasn't very forthcoming about it. Overall Pt has fairly stable weight encounters that he has a goal weight of 175#, currently at 187.4#. Per weight encounters in chart since 06/2018 he gains some weight, then loses some, but  stays around 87-85kg.   Pt states that he does try to visit the gym 2-3 times a week for interval training and takes protein power [employer]). Pt states that he doesn't take any supplements such as MVI nor meal supplements.    Pt is agreeable to have Ensure Max once a [place] and Juven BID to promote wound healing of L Great Toe Ucleration.   Medications reviewed and include:  Folic Acid MVI with Minerals Thiamine   0.9% Sodium Chloride IV 49mL/Hr   NUTRITION - FOCUSED PHYSICAL EXAM:    Most Recent Value  Orbital Region  No depletion  Upper Arm Region  No depletion  Thoracic and Lumbar Region  No depletion  Buccal Region  No depletion  Temple Region  No depletion  Clavicle Bone Region  Mild depletion  Clavicle and Acromion Bone Region  Mild depletion  Scapular Bone Region  No depletion  Dorsal Hand  No depletion  Patellar Region  No depletion  Anterior Thigh Region  No depletion  Posterior Calf Region  No depletion  Edema (RD Assessment)  Mild [L Foot/Ankle, L Great Toe Unlceration]  Hair  Reviewed  Eyes  Reviewed  Mouth  Reviewed  Skin  Reviewed  Nails  Reviewed       Diet Order:   Diet Order            Diet regular Room service appropriate? Yes; Fluid consistency: Thin  Diet effective now              EDUCATION NEEDS:   Education needs have been addressed  Skin:  Skin Assessment: Reviewed RN Assessment  Last BM:  2/19  Height:   Ht Readings from Last 1 Encounters:  12/27/18 5' 9.5" (1.765 m)    Weight:   Wt Readings from Last 1 Encounters:  12/27/18 85.2 kg    Ideal Body Weight:  74.1 kg  BMI:  Body mass index is 27.34 kg/m.  Estimated Nutritional Needs:   Kcal:  2100-2400  Protein:  110-130  Fluid:  >2L    Herma Carson, [place] Dietetic Intern

## 2018-12-28 NOTE — Progress Notes (Addendum)
PROGRESS NOTE  Attending MD note  Patient was seen, examined,treatment plan was discussed with the PA-S.  I have personally reviewed the clinical findings, lab, imaging studies and management of this patient in detail. I agree with the documentation, as recorded by the PA-S  Patient is 58 year old male with history of alcohol abuse, non-Hodgkin's lymphoma status post chemotherapy in 2012 with residual bilateral lower extremity neuropathy, PE on chronic anticoagulation with Coumadin, hypertension, mitral prolapse status post repair in 2017 who comes to the hospital with chief complaint of an infected ulcer of his left toe.  He was placed on broad-spectrum antibiotics and orthopedic surgery was consulted.  Constitutional: NAD, appears tremulous Vitals:   12/27/18 1645 12/27/18 1736 12/28/18 0200 12/28/18 1344  BP: 121/81 (!) 117/94 117/88 113/79  Pulse: (!) 130 (!) 129 92 (!) 102  Resp: 20  20   Temp:  97.7 F (36.5 C) 98.8 F (37.1 C) 99.3 F (37.4 C)  TempSrc:  Oral Oral Oral  SpO2: 97% 100% 97%   Weight:      Height:       Eyes: PERRL, lids and conjunctivae normal ENMT: Mucous membranes are moist.  Neck: normal, supple Respiratory: clear to auscultation bilaterally, no wheezing, no crackles.  Cardiovascular: Regular rate and rhythm, no murmurs / rubs / gallops.  Abdomen: no tenderness, no masses palpated. Bowel sounds positive.  Musculoskeletal: no clubbing / cyanosis.  Skin: no rashes, lesions, ulcers. No induration Neurologic: CN 2-12 grossly intact. Strength 5/5 in all 4.  Psychiatric: Normal judgment and insight. Alert and oriented x 3. Normal mood.   Plan Left toe ulceration with surrounding cellulitis -Continue broad-spectrum antibiotics, discussed with Dr. [name] he recommends IV antibiotics for a few days then convert to p.o.  Alcohol abuse with withdrawal -Continue CIWA, he is visibly tremulous  Mild pancytopenia -His degree of chronic anemia in the setting of  chronic illness, his platelets are low likely in the setting of alcohol withdrawal.  I will check an anemia panel as well as B12 level  History of PE -Continue Coumadin  History of non-Hodgkin's lymphoma -Status post treatment  Left index finger trauma -Happened as an outpatient, x-ray without acute fractures   Scheduled Meds: . folic acid  1 mg Oral Daily  . multivitamin with minerals  1 tablet Oral Daily  . nutrition supplement (JUVEN)  1 packet Oral BID BM  . ENSURE MAX PROTEIN  11 oz Oral Daily  . thiamine  100 mg Oral Daily   Or  . thiamine  100 mg Intravenous Daily   Continuous Infusions: . sodium chloride 75 mL/hr at 12/28/18 0500  . piperacillin-tazobactam (ZOSYN)  IV 3.375 g (12/28/18 0800)  . vancomycin 1,250 mg (12/28/18 0636)   PRN Meds:.LORazepam **OR** LORazepam, morphine injection, ondansetron **OR** ondansetron (ZOFRAN) IV   Rest as below  Leonard Bentley M. Cruzita Lederer, MD, PhD [facility]  Contact via  [email or web address]  [facility] Office Info P: [phone]  F: [phone]   Leonard Bentley SWN:[id number] DOB: 1961-03-11 DOA: 12/27/2018 PCP: [name], MD  HPI/Brief Narrative  Leonard Bentley is a 58 y.o. year old male with medical history significant for pulmonary embolism on anticoagulation with Coumadin, EtOH abuse, lymphoma status post chemotherapy (finished 2012), hypertension mitral prolapse status post mitral valve repair 2017; was admitted from the ED on 12/27/2018 for infected ulcer of left big toe, developing gangrene. Following admission, pt was started on Vancomycin and Zosyn. Pt with history of alcohol abuse (1 bottle of wine  every night); seizure-alcohol withdrawal was noted in the ED. CIWA protocol was started upon admission. Orthopedic consult 12/27/2018; blood blister on L great toe was unroofed, and postoperative shoe was ordered. Shoe has been fitted, and pt is able to bear weight on L foot with minimal discomfort. Of note, pt suffers chronic neuropathy  and decreased sensation in bilateral lower and upper extremities. No history of diabetes.   12/27/2018 Xray L foot showed soft tissue swelling involving the left big toe, but no osteomyelitis. 12/27/2018 MRI L foot showed diffuse subcutaneous soft tissue edema the forefoot consistent with history of cellulitis; subtle cortical bone loss along the dorsal medial aspect of the distal phalanx of the great toe more uniform marrow edema of thedistal phalanx raising concern for changes of osteomyelitis; small ganglion cyst along the dorsum the first proximal phalanx measuring 11 x 6 x 9 mm; degenerative subcortical cyst of the lateral cuneiform measuring 8 x 5 x 6 mm.  12/28/2018 Xray L hand showed no evidence of fracture or dislocation. There is no evidence of arthropathy or other focal bone abnormality. Soft tissues are unremarkable.  Subjective Pt reports his L foot discomfort is somewhat improved following blood blister removal yesterday. He is able to bear weight on the foot with his postoperative shoe. He he experiencing tremors in all 4 extremities due to alcohol withdrawal, but he denies significant discomfort.   Pt also notes that he recently shut his left pointer finger in the car door, resulting in blackening of the nail and surrounding cuticle.  Assessment/Plan:  Infected ulcer left big toe Pt with insensate neuropathy bilateral feet secondary to chemotherapy for non-Hodgkin's lymphoma (last chemo 2012), now with infected ulcer left big toe with surrounding gangrene. Xray left foot shows no osteomyelitis; however, MRI left foot showed subcortical bone loss raising concern for changes of osteomyelitis. Continue Vancomycin and Zosyn, continue postoperative shoe.   Subungual hematoma left second finger Xray left hand negative for fracture or dislocation. Will proceed with buddy taping.   Alcohol withdrawal Pt had back to back seizues in triage; he reports drinking 1 bottle of wine nightly x30  years. Continue CIWA protocol, Ativan.   History of PE Coumadin was held yesterday due to INR of >10.00. INR at 2.95 today; restarted Coumadin.   Supratherapeutic INR Pt on anticoagulation with Coumadin for history of pulmonary embolism. INR now in back in normal range. Coumadin restarted.   History of non-Hodgkin's Lymphoma Pt with insensate neuropathy bilateral feet secondary to chemotherapy for non-Hodgkin's lymphoma (last chemo 2012).   DVT prophylaxis: Coumadin Antimicrobials: Zosyn, Vancomycin  Code Status: full code  Objective: Vitals:   12/27/18 1645 12/27/18 1736 12/28/18 0200 12/28/18 1344  BP: 121/81 (!) 117/94 117/88 113/79  Pulse: (!) 130 (!) 129 92 (!) 102  Resp: 20  20   Temp:  97.7 F (36.5 C) 98.8 F (37.1 C) 99.3 F (37.4 C)  TempSrc:  Oral Oral Oral  SpO2: 97% 100% 97%   Weight:      Height:        Intake/Output Summary (Last 24 hours) at 12/28/2018 1347 Last data filed at 12/28/2018 0500 Gross per 24 hour  Intake 793.89 ml  Output -  Net 793.89 ml   Filed Weights   12/27/18 1305  Weight: 85.2 kg    Exam:  Constitutional: appears in no acute distress HEENT: grossly normal Cardiovascular: RRR no MRGs, with no peripheral edema. DP pulses palpable bilaterally. Respiratory: Normal respiratory effort, clear breath sounds  Abdomen:  Soft,non-tender, with no HSM Skin: Large ulcer noted on the dorsal aspect of left big toe, tip of the left big toe is blackish discoloration. L hand second finger with blackened fingernail. Neurologic: Grossly no focal neuro deficit. Tremor of all 4 extremities. Psychiatric: Appropriate affect, and mood. Mental status AAOx3  Data Reviewed: CBC: Recent Labs  Lab 12/27/18 1306 12/28/18 0423  WBC 5.1 2.3*  NEUTROABS 3.6  --   HGB 12.6* 10.3*  HCT 38.6* 31.9*  MCV 82.0 80.2  PLT 139* 573*   Basic Metabolic Panel: Recent Labs  Lab 12/27/18 1306 12/28/18 0423  NA 138 139  K 4.6 4.0  CL 102 106  CO2 20* 20*    GLUCOSE 108* 88  BUN 9 9  CREATININE 0.89 0.91  CALCIUM 9.2 8.9   GFR: Estimated Creatinine Clearance: 88.9 mL/min (by C-G formula based on SCr of 0.91 mg/dL). Liver Function Tests: Recent Labs  Lab 12/27/18 1306 12/28/18 0423  AST 85* 64*  ALT 52* 41  ALKPHOS 54 46  BILITOT 0.5 1.4*  PROT 7.3 5.8*  ALBUMIN 4.0 3.4*   Recent Labs  Lab 12/27/18 1306  LIPASE 85*   No results for input(s): AMMONIA in the last 168 hours. Coagulation Profile: Recent Labs  Lab 12/27/18 1306 12/28/18 1103  INR >10.00* 2.95   Cardiac Enzymes: No results for input(s): CKTOTAL, CKMB, CKMBINDEX, TROPONINI in the last 168 hours. BNP (last 3 results) No results for input(s): PROBNP in the last 8760 hours. HbA1C: No results for input(s): HGBA1C in the last 72 hours. CBG: No results for input(s): GLUCAP in the last 168 hours. Lipid Profile: No results for input(s): CHOL, HDL, LDLCALC, TRIG, CHOLHDL, LDLDIRECT in the last 72 hours. Thyroid Function Tests: No results for input(s): TSH, T4TOTAL, FREET4, T3FREE, THYROIDAB in the last 72 hours. Anemia Panel: No results for input(s): VITAMINB12, FOLATE, FERRITIN, TIBC, IRON, RETICCTPCT in the last 72 hours. Urine analysis:    Component Value Date/Time   COLORURINE RED (A) 11/01/2018 2149   APPEARANCEUR TURBID (A) 11/01/2018 2149   LABSPEC 1.018 11/01/2018 2149   PHURINE 7.0 11/01/2018 2149   GLUCOSEU NEGATIVE 11/01/2018 2149   HGBUR MODERATE (A) 11/01/2018 2149   BILIRUBINUR NEGATIVE 11/01/2018 2149   [place] NEGATIVE 11/01/2018 2149   PROTEINUR 100 (A) 11/01/2018 2149   UROBILINOGEN 1.0 01/25/2013 0825   NITRITE NEGATIVE 11/01/2018 2149   LEUKOCYTESUR NEGATIVE 11/01/2018 2149   Sepsis Labs: @LABRCNTIP (procalcitonin:4,lacticidven:4)  )No results found for this or any previous visit (from the past 240 hour(s)).    Studies: Dg Hand 2 View Left  Result Date: 12/28/2018 CLINICAL DATA:  Distal left index finger pain following a crush  injury 3 weeks ago. EXAM: LEFT HAND - 2 VIEW COMPARISON:  None. FINDINGS: There is no evidence of fracture or dislocation. There is no evidence of arthropathy or other focal bone abnormality. Soft tissues are unremarkable. IMPRESSION: Negative. Electronically Signed   By: Claudie Revering M.D.   On: 12/28/2018 12:44   Mr Foot Left [name]  Result Date: 12/27/2018 CLINICAL DATA:  Worsening infection of the left great toe despite antibiotics. EXAM: MRI OF THE LEFT FOREFOOT WITHOUT AND WITH CONTRAST TECHNIQUE: Multiplanar, multisequence MR imaging of the left foot was performed both before and after administration of intravenous contrast. CONTRAST:  8 mm mild Gadavist IV COMPARISON:  Left foot radiographs 12/07/2018 and great toe radiographs 12/27/2018 FINDINGS: Bones/Joint/Cartilage Subtle cortical bone loss along the dorsal medial aspect of the distal phalanx of the left  great toe, series 4/11. Marrow edema of the distal phalanx concerning for early changes of osteomyelitis are noted, series 8/15. No acute fracture joint dislocations. Subcortical cyst involving the lateral cuneiform is identified measuring approximately 8 x 5 x 6 mm. Ligaments Negative Muscles and Tendons No evidence of pyomyositis. Mild intramuscular edema likely neurogenic. Soft tissues Small ganglion cyst along the dorsum of the first proximal phalanx measuring approximately 11 x 6 x 9 mm. Diffuse soft tissue edema is noted consistent with cellulitis. IMPRESSION: 1. Diffuse subcutaneous soft tissue edema the forefoot consistent with history of cellulitis. 2. Subtle cortical bone loss along the dorsal medial aspect of the distal phalanx of the great toe more uniform marrow edema of the distal phalanx raising concern for changes of osteomyelitis. 3. Small ganglion cyst along the dorsum the first proximal phalanx measuring 11 x 6 x 9 mm. 4. Degenerative subcortical cyst of the lateral cuneiform measuring 8 x 5 x 6 mm. Electronically Signed    By: Ashley Royalty M.D.   On: 12/27/2018 21:28   Dg Toe Great Left  Result Date: 12/27/2018 CLINICAL DATA:  necrotic great toe, please eval for possible osteomyelitis/gasPatient reports pain in left great toe for 3 weeks. Patient states it started as blister along dorsal surface of great toe 3 weeks ago, he placed a band-aid on it and 2 weeks ago his distal great toe started to turn black. Pain is localized to great toe and does not radiate into rest of foot. No prior surgery or injury to great toe. EXAM: LEFT GREAT TOE COMPARISON:  12/07/2018 FINDINGS: There is no evidence of fracture or dislocation. No focal cortical erosion to suggest osteomyelitis. There is no evidence of arthropathy or other focal bone abnormality. Soft tissue swelling about the distal phalanx. No subcutaneous gas or radiodense foreign body. IMPRESSION: 1. Soft tissue swelling without bony abnormality. 2. No radiographic evidence of osteomyelitis. The Electronically Signed   By: Lucrezia Europe M.D.   On: 12/27/2018 14:55    Scheduled Meds: . folic acid  1 mg Oral Daily  . multivitamin with minerals  1 tablet Oral Daily  . nutrition supplement (JUVEN)  1 packet Oral BID BM  . ENSURE MAX PROTEIN  11 oz Oral Daily  . thiamine  100 mg Oral Daily   Or  . thiamine  100 mg Intravenous Daily    Continuous Infusions: . sodium chloride 75 mL/hr at 12/28/18 0500  . piperacillin-tazobactam (ZOSYN)  IV 3.375 g (12/28/18 0800)  . vancomycin 1,250 mg (12/28/18 0636)     LOS: 1 [place]    Marin Roberts, PA-S 12/28/18

## 2018-12-28 NOTE — Progress Notes (Addendum)
ANTICOAGULATION CONSULT NOTE - Initial Consult  Pharmacy Consult for Warfarin Indication: history of PE  Allergies  Allergen Reactions  . Other Other (See Comments)    "Cactus" blisters on tongue    Patient Measurements: Height: 5' 9.5" (176.5 cm) Weight: 187 lb 12.8 oz (85.2 kg) IBW/kg (Calculated) : 71.85  Vital Signs: Temp: 98.8 F (37.1 C) (02/20 0200) Temp Source: Oral (02/20 0200) BP: 117/88 (02/20 0200) Pulse Rate: 92 (02/20 0200)  Labs: Recent Labs    12/27/18 1306 12/28/18 0423 12/28/18 1103  HGB 12.6* 10.3*  --   HCT 38.6* 31.9*  --   PLT 139* 144*  --   LABPROT >90.0*  --  30.3*  INR >10.00*  --  2.95  CREATININE 0.89 0.91  --     Estimated Creatinine Clearance: 88.9 mL/min (by C-G formula based on SCr of 0.91 mg/dL).   Medical History: Past Medical History:  Diagnosis Date  . Acute pulmonary embolism ([place]) 04/11/2016  . Anxiety   . Arthritis   . Atrial tachycardia ([place]) 01/06/2016  . Depression   . DTs (delirium tremens) ([name])   . Dyspnea   . ED (erectile dysfunction)   . ETOH abuse   . Hypertension   . Lymphoma, non Hodgkin's 09/25/2011   Stage 2  . Mitral regurgitation 01/20/2016  . Mobitz I 10/09/2015  . Murmur 01/06/2016  . Occasional tremors   . PAC (premature atrial contraction) 10/09/2015  . S/P minimally-invasive mitral valve repair 10/27/2016   Complex valvuloplasty including artificial Gore-tex neochord placement x6 and 34 mm Edwards Physio II ring annuloplasty via right mini thoracotomy approach    Medications:  Scheduled:  . folic acid  1 mg Oral Daily  . multivitamin with minerals  1 tablet Oral Daily  . nutrition supplement (JUVEN)  1 packet Oral BID BM  . ENSURE MAX PROTEIN  11 oz Oral Daily  . thiamine  100 mg Oral Daily   Or  . thiamine  100 mg Intravenous Daily    Assessment: Leonard Bentley is a 58 year old male here for cellulitis of big toe with no plans for surgery. Patient has history of PE on Warfarin 5mg  daily PTA,  last dose on 2/18.  Patient's INR yesterday was >10 and received Vitamin K 5mg  for reversal.  Today INR is now 2.95, currently therapeutic. Primary team consulted pharmacy to restart warfarin. CBC this morning was slightly down since yesterday: Hgb 12.6 > 10.3 and Hct 38.6 > 31.9; confirmed with nurse no signs or symptoms of bleeding.   Due to being at the higher end of INR goal, will start low dose warfarin tonight   Goal of Therapy:  INR 2-3 Monitor platelets by anticoagulation protocol: Yes   Plan:  Warfarin 2.5mg  tonight PO x1 INR / CBC in the morning   Thank you for allowing pharmacy to be a part of this patient's care.  Tamela Gammon, PharmD 12/28/2018 1:57 PM PGY-1 Pharmacy Resident Direct Phone: [phone] Please check [email or web address] for unit-specific pharmacist phone numbers

## 2018-12-28 NOTE — Plan of Care (Signed)
  Problem: Pain Managment: Goal: General experience of comfort will improve Outcome: Progressing   Problem: Safety: Goal: Ability to remain free from injury will improve Outcome: Progressing   Problem: Skin Integrity: Goal: Risk for impaired skin integrity will decrease Outcome: Progressing   

## 2018-12-29 LAB — CBC
HCT: 31.5 % — ABNORMAL LOW (ref 39.0–52.0)
Hemoglobin: 10.5 g/dL — ABNORMAL LOW (ref 13.0–17.0)
MCH: 27 pg (ref 26.0–34.0)
MCHC: 33.3 g/dL (ref 30.0–36.0)
MCV: 81 fL (ref 80.0–100.0)
Platelets: 144 10*3/uL — ABNORMAL LOW (ref 150–400)
RBC: 3.89 MIL/uL — ABNORMAL LOW (ref 4.22–5.81)
RDW: 15.8 % — ABNORMAL HIGH (ref 11.5–15.5)
WBC: 2.6 10*3/uL — ABNORMAL LOW (ref 4.0–10.5)
nRBC: 1.5 % — ABNORMAL HIGH (ref 0.0–0.2)

## 2018-12-29 LAB — IRON AND TIBC
Iron: 103 ug/dL (ref 45–182)
Saturation Ratios: 37 % (ref 17.9–39.5)
TIBC: 277 ug/dL (ref 250–450)
UIBC: 174 ug/dL

## 2018-12-29 LAB — RETICULOCYTES
Immature Retic Fract: 20.9 % — ABNORMAL HIGH (ref 2.3–15.9)
RBC.: 3.89 MIL/uL — ABNORMAL LOW (ref 4.22–5.81)
Retic Count, Absolute: 50.6 10*3/uL (ref 19.0–186.0)
Retic Ct Pct: 1.3 % (ref 0.4–3.1)

## 2018-12-29 LAB — VITAMIN B12: Vitamin B-12: 521 pg/mL (ref 180–914)

## 2018-12-29 LAB — BASIC METABOLIC PANEL
Anion gap: 9 (ref 5–15)
BUN: 11 mg/dL (ref 6–20)
CO2: 26 mmol/L (ref 22–32)
Calcium: 9.1 mg/dL (ref 8.9–10.3)
Chloride: 105 mmol/L (ref 98–111)
Creatinine, Ser: 0.99 mg/dL (ref 0.61–1.24)
GFR calc Af Amer: >60 mL/min (ref >60)
GFR calc non Af Amer: >60 mL/min (ref >60)
Glucose, Bld: 110 mg/dL — ABNORMAL HIGH (ref 70–99)
Potassium: 3.6 mmol/L (ref 3.5–5.1)
Sodium: 140 mmol/L (ref 135–145)

## 2018-12-29 LAB — PROTIME-INR
INR: 2.39
Prothrombin Time: 25.7 seconds — ABNORMAL HIGH (ref 11.4–15.2)

## 2018-12-29 LAB — FERRITIN: Ferritin: 212 ng/mL (ref 24–336)

## 2018-12-29 LAB — FOLATE: Folate: 7.7 ng/mL (ref >5.9)

## 2018-12-29 MED ORDER — WHITE PETROLATUM EX OINT
TOPICAL_OINTMENT | CUTANEOUS | Status: AC
  Administered 2018-12-29: 0.2
  Filled 2018-12-29: qty 28.35

## 2018-12-29 MED ORDER — WARFARIN - PHARMACIST DOSING INPATIENT
Freq: Every day | Status: DC
  Administered 2018-12-30: 17:00:00

## 2018-12-29 MED ORDER — WARFARIN SODIUM 4 MG PO TABS
4.0000 mg | ORAL_TABLET | Freq: Once | ORAL | Status: AC
  Filled 2018-12-29: qty 1

## 2018-12-29 NOTE — Progress Notes (Signed)
ANTICOAGULATION CONSULT NOTE - Initial Consult  Pharmacy Consult for Warfarin Indication: history of PE  Allergies  Allergen Reactions  . Other Other (See Comments)    "Cactus" blisters on tongue    Patient Measurements: Height: 5' 9.5" (176.5 cm) Weight: 187 lb 12.8 oz (85.2 kg) IBW/kg (Calculated) : 71.85  Vital Signs: Temp: 99 F (37.2 C) (02/21 0426) Temp Source: Oral (02/21 0426) BP: 127/102 (02/21 0426) Pulse Rate: 125 (02/21 0426)  Labs: Recent Labs    12/27/18 1306 12/28/18 0423 12/28/18 1103 12/29/18 0341  HGB 12.6* 10.3*  --  10.5*  HCT 38.6* 31.9*  --  31.5*  PLT 139* 144*  --  144*  LABPROT >90.0*  --  30.3* 25.7*  INR >10.00*  --  2.95 2.39  CREATININE 0.89 0.91  --  0.99    Estimated Creatinine Clearance: 81.7 mL/min (by C-G formula based on SCr of 0.99 mg/dL).   Medical History: Past Medical History:  Diagnosis Date  . Acute pulmonary embolism ([place]) 04/11/2016  . Anxiety   . Arthritis   . Atrial tachycardia ([place]) 01/06/2016  . Depression   . DTs (delirium tremens) ([place])   . Dyspnea   . ED (erectile dysfunction)   . ETOH abuse   . Hypertension   . Lymphoma, non Hodgkin's 09/25/2011   Stage 2  . Mitral regurgitation 01/20/2016  . Mobitz I 10/09/2015  . Murmur 01/06/2016  . Occasional tremors   . PAC (premature atrial contraction) 10/09/2015  . S/P minimally-invasive mitral valve repair 10/27/2016   Complex valvuloplasty including artificial Gore-tex neochord placement x6 and 34 mm Edwards Physio II ring annuloplasty via right mini thoracotomy approach    Medications:  Scheduled:  . folic acid  1 mg Oral Daily  . multivitamin with minerals  1 tablet Oral Daily  . nicotine  21 mg Transdermal Daily  . nutrition supplement (JUVEN)  1 packet Oral BID BM  . ENSURE MAX PROTEIN  11 oz Oral Daily  . thiamine  100 mg Oral Daily   Or  . thiamine  100 mg Intravenous Daily    Assessment: Leonard Bentley is a 58 year old male here for cellulitis of big  toe with no plans for surgery. Patient has history of PE on Warfarin 5mg  daily PTA, last dose on 2/18.  Patient's INR yesterday was >10 and received Vitamin K 5mg  for reversal.  Today INR is now 2.39, currently therapeutic. Primary team consulted pharmacy to restart warfarin. CBC this morning was slightly down since yesterday: Hgb 12.6 > 10.3>10.5; confirmed with nurse no signs or symptoms of bleeding.    Goal of Therapy:  INR 2-3 Monitor platelets by anticoagulation protocol: Yes   Plan:  Warfarin 4 mg tonight PO x1 INR / CBC in the morning   Thank you for allowing pharmacy to be a part of this patient's care.  Alanda Slim, PharmD, [facility] [place] Clinical Pharmacist Please see AMION for all Pharmacists' Contact Phone Numbers 12/29/2018, 12:28 PM

## 2018-12-29 NOTE — Progress Notes (Signed)
PROGRESS NOTE  Leonard Bentley WER:[id number] DOB: Feb 26, 1961 DOA: 12/27/2018 PCP: [name], MD   LOS: 2 days   Brief Narrative / Interim history: 58 year old male with history of alcohol abuse, non-Hodgkin's lymphoma status post chemotherapy in 2012 with residual bilateral lower extremity neuropathy, PE on chronic anticoagulation with Coumadin, hypertension, mitral prolapse status post repair in 2017 who comes to the hospital with chief complaint of an infected ulcer of his left toe.  He was placed on broad-spectrum antibiotics and orthopedic surgery was consulted.  Subjective: Denies any fever chills, no chest pain, shortness of breath.  Feels a little bit tremulous this morning.  No abdominal pain, nausea or vomiting.  Assessment & Plan: Active Problems:   Foot ulcer, left (HCC)   Principal Problem: Left toe ulceration with surrounding cellulitis -Continue broad-spectrum antibiotics, anticipate ability to change to p.o. in 1 to 2 days.  Discussed with Dr. [name] over the phone this morning, he reviewed the MRI, this is less likely osteomyelitis in his opinion and recommended oral antibiotics and outpatient follow-up  Active problems: Alcohol abuse with withdrawal -He remains visibly tremulous and appears to be withdrawing, he was last alcoholic drink was on Wednesday which is less than 48 hours ago.  He is at high risk for decompensation given his seizures x2 back-to-back in the ED, continue to monitor in the inpatient setting  Mild pancytopenia -Has a degree of chronic anemia in the setting of chronic illness -Anemia panel this morning shows fairly good iron levels, folate and B12 are normal, suggesting that his pancytopenia is probably related to alcohol induced bone marrow suppression  History of PE -Continue Coumadin, INR stable at 2.39  History of non-Hodgkin's lymphoma -Status post treatment in 2012  Left index finger trauma -Happened as an outpatient, x-ray without  acute fractures   Scheduled Meds: . folic acid  1 mg Oral Daily  . multivitamin with minerals  1 tablet Oral Daily  . nicotine  21 mg Transdermal Daily  . nutrition supplement (JUVEN)  1 packet Oral BID BM  . ENSURE MAX PROTEIN  11 oz Oral Daily  . thiamine  100 mg Oral Daily   Or  . thiamine  100 mg Intravenous Daily   Continuous Infusions: . sodium chloride 75 mL/hr at 12/28/18 1741  . piperacillin-tazobactam (ZOSYN)  IV 3.375 g (12/29/18 0800)  . vancomycin 1,250 mg (12/29/18 0623)   PRN Meds:.LORazepam **OR** LORazepam, morphine injection, ondansetron **OR** ondansetron (ZOFRAN) IV   DVT prophylaxis: On Coumadin Code Status: Full code Family Communication: No family present at bedside Disposition Plan: Home when ready  Consultants:   Orthopedic surgery  Procedures:   None   Antimicrobials:  Vancomycin / Zosyn 2/19 >>   Objective: Vitals:   12/28/18 0200 12/28/18 1344 12/28/18 2017 12/29/18 0426  BP: 117/88 113/79 124/90 (!) 127/102  Pulse: 92 (!) 102 89 (!) 125  Resp: 20  18 18   Temp: 98.8 F (37.1 C) 99.3 F (37.4 C) 98.7 F (37.1 C) 99 F (37.2 C)  TempSrc: Oral Oral Oral Oral  SpO2: 97%  99% 100%  Weight:      Height:        Intake/Output Summary (Last 24 hours) at 12/29/2018 0930 Last data filed at 12/29/2018 0900 Gross per 24 hour  Intake 680 ml  Output 625 ml  Net 55 ml   Filed Weights   12/27/18 1305  Weight: 85.2 kg    Examination:  Constitutional: NAD, appears tremulous Eyes: No scleral  icterus ENMT: Mucous membranes are moist. No oropharyngeal exudates Neck: normal, supple Respiratory: clear to auscultation bilaterally, no wheezing, no crackles.  Cardiovascular: Regular rate and rhythm, no murmurs / rubs / gallops. No LE edema.  Abdomen: no tenderness. Bowel sounds positive.  Skin: no rashes, left great toe ulcer with mild surrounding cellulitis Neurologic: Grossly nonfocal   Data Reviewed: I have independently reviewed  following labs and imaging studies   CBC: Recent Labs  Lab 12/27/18 1306 12/28/18 0423 12/29/18 0341  WBC 5.1 2.3* 2.6*  NEUTROABS 3.6  --   --   HGB 12.6* 10.3* 10.5*  HCT 38.6* 31.9* 31.5*  MCV 82.0 80.2 81.0  PLT 139* 144* 409*   Basic Metabolic Panel: Recent Labs  Lab 12/27/18 1306 12/28/18 0423 12/29/18 0341  NA 138 139 140  K 4.6 4.0 3.6  CL 102 106 105  CO2 20* 20* 26  GLUCOSE 108* 88 110*  BUN 9 9 11   CREATININE 0.89 0.91 0.99  CALCIUM 9.2 8.9 9.1   GFR: Estimated Creatinine Clearance: 81.7 mL/min (by C-G formula based on SCr of 0.99 mg/dL). Liver Function Tests: Recent Labs  Lab 12/27/18 1306 12/28/18 0423  AST 85* 64*  ALT 52* 41  ALKPHOS 54 46  BILITOT 0.5 1.4*  PROT 7.3 5.8*  ALBUMIN 4.0 3.4*   Recent Labs  Lab 12/27/18 1306  LIPASE 85*   No results for input(s): AMMONIA in the last 168 hours. Coagulation Profile: Recent Labs  Lab 12/27/18 1306 12/28/18 1103 12/29/18 0341  INR >10.00* 2.95 2.39   Cardiac Enzymes: No results for input(s): CKTOTAL, CKMB, CKMBINDEX, TROPONINI in the last 168 hours. BNP (last 3 results) No results for input(s): PROBNP in the last 8760 hours. HbA1C: No results for input(s): HGBA1C in the last 72 hours. CBG: No results for input(s): GLUCAP in the last 168 hours. Lipid Profile: No results for input(s): CHOL, HDL, LDLCALC, TRIG, CHOLHDL, LDLDIRECT in the last 72 hours. Thyroid Function Tests: No results for input(s): TSH, T4TOTAL, FREET4, T3FREE, THYROIDAB in the last 72 hours. Anemia Panel: Recent Labs    12/29/18 0341  VITAMINB12 521  FOLATE 7.7  FERRITIN 212  TIBC 277  IRON 103  RETICCTPCT 1.3   Urine analysis:    Component Value Date/Time   COLORURINE RED (A) 11/01/2018 2149   APPEARANCEUR TURBID (A) 11/01/2018 2149   LABSPEC 1.018 11/01/2018 2149   PHURINE 7.0 11/01/2018 2149   GLUCOSEU NEGATIVE 11/01/2018 2149   HGBUR MODERATE (A) 11/01/2018 2149   [place] NEGATIVE 11/01/2018 2149    [place] NEGATIVE 11/01/2018 2149   PROTEINUR 100 (A) 11/01/2018 2149   UROBILINOGEN 1.0 01/25/2013 0825   NITRITE NEGATIVE 11/01/2018 2149   LEUKOCYTESUR NEGATIVE 11/01/2018 2149   Sepsis Labs: Invalid input(s): PROCALCITONIN, LACTICIDVEN  No results found for this or any previous visit (from the past 240 hour(s)).    Radiology Studies: Dg Hand 2 View Left  Result Date: 12/28/2018 CLINICAL DATA:  Distal left index finger pain following a crush injury 3 weeks ago. EXAM: LEFT HAND - 2 VIEW COMPARISON:  None. FINDINGS: There is no evidence of fracture or dislocation. There is no evidence of arthropathy or other focal bone abnormality. Soft tissues are unremarkable. IMPRESSION: Negative. Electronically Signed   By: Claudie Revering M.D.   On: 12/28/2018 12:44   Mr Foot Left [name]  Result Date: 12/27/2018 CLINICAL DATA:  Worsening infection of the left great toe despite antibiotics. EXAM: MRI OF THE LEFT FOREFOOT WITHOUT AND  WITH CONTRAST TECHNIQUE: Multiplanar, multisequence MR imaging of the left foot was performed both before and after administration of intravenous contrast. CONTRAST:  8 mm mild Gadavist IV COMPARISON:  Left foot radiographs 12/07/2018 and great toe radiographs 12/27/2018 FINDINGS: Bones/Joint/Cartilage Subtle cortical bone loss along the dorsal medial aspect of the distal phalanx of the left great toe, series 4/11. Marrow edema of the distal phalanx concerning for early changes of osteomyelitis are noted, series 8/15. No acute fracture joint dislocations. Subcortical cyst involving the lateral cuneiform is identified measuring approximately 8 x 5 x 6 mm. Ligaments Negative Muscles and Tendons No evidence of pyomyositis. Mild intramuscular edema likely neurogenic. Soft tissues Small ganglion cyst along the dorsum of the first proximal phalanx measuring approximately 11 x 6 x 9 mm. Diffuse soft tissue edema is noted consistent with cellulitis. IMPRESSION: 1. Diffuse  subcutaneous soft tissue edema the forefoot consistent with history of cellulitis. 2. Subtle cortical bone loss along the dorsal medial aspect of the distal phalanx of the great toe more uniform marrow edema of the distal phalanx raising concern for changes of osteomyelitis. 3. Small ganglion cyst along the dorsum the first proximal phalanx measuring 11 x 6 x 9 mm. 4. Degenerative subcortical cyst of the lateral cuneiform measuring 8 x 5 x 6 mm. Electronically Signed   By: Ashley Royalty M.D.   On: 12/27/2018 21:28   Dg Toe Great Left  Result Date: 12/27/2018 CLINICAL DATA:  necrotic great toe, please eval for possible osteomyelitis/gasPatient reports pain in left great toe for 3 weeks. Patient states it started as blister along dorsal surface of great toe 3 weeks ago, he placed a band-aid on it and 2 weeks ago his distal great toe started to turn black. Pain is localized to great toe and does not radiate into rest of foot. No prior surgery or injury to great toe. EXAM: LEFT GREAT TOE COMPARISON:  12/07/2018 FINDINGS: There is no evidence of fracture or dislocation. No focal cortical erosion to suggest osteomyelitis. There is no evidence of arthropathy or other focal bone abnormality. Soft tissue swelling about the distal phalanx. No subcutaneous gas or radiodense foreign body. IMPRESSION: 1. Soft tissue swelling without bony abnormality. 2. No radiographic evidence of osteomyelitis. The Electronically Signed   By: Lucrezia Europe M.D.   On: 12/27/2018 14:55    Marzetta Board, MD, PhD [facility]  Contact via  [email or web address]  [place] P: [phone]  F: [phone]

## 2018-12-30 LAB — BASIC METABOLIC PANEL
Anion gap: 10 (ref 5–15)
BUN: 15 mg/dL (ref 6–20)
CO2: 22 mmol/L (ref 22–32)
Calcium: 9.3 mg/dL (ref 8.9–10.3)
Chloride: 107 mmol/L (ref 98–111)
Creatinine, Ser: 0.89 mg/dL (ref 0.61–1.24)
GFR calc Af Amer: >60 mL/min (ref >60)
GFR calc non Af Amer: >60 mL/min (ref >60)
Glucose, Bld: 135 mg/dL — ABNORMAL HIGH (ref 70–99)
Potassium: 3.5 mmol/L (ref 3.5–5.1)
Sodium: 139 mmol/L (ref 135–145)

## 2018-12-30 LAB — PROTIME-INR
INR: 1.93
Prothrombin Time: 21.8 seconds — ABNORMAL HIGH (ref 11.4–15.2)

## 2018-12-30 LAB — CBC
HCT: 34.2 % — ABNORMAL LOW (ref 39.0–52.0)
Hemoglobin: 10.9 g/dL — ABNORMAL LOW (ref 13.0–17.0)
MCH: 26.5 pg (ref 26.0–34.0)
MCHC: 31.9 g/dL (ref 30.0–36.0)
MCV: 83 fL (ref 80.0–100.0)
Platelets: 145 10*3/uL — ABNORMAL LOW (ref 150–400)
RBC: 4.12 MIL/uL — ABNORMAL LOW (ref 4.22–5.81)
RDW: 16.1 % — ABNORMAL HIGH (ref 11.5–15.5)
WBC: 3.8 10*3/uL — ABNORMAL LOW (ref 4.0–10.5)
nRBC: 1 % — ABNORMAL HIGH (ref 0.0–0.2)

## 2018-12-30 MED ORDER — LORAZEPAM 2 MG/ML IJ SOLN
1.0000 mg | Freq: Four times a day (QID) | INTRAMUSCULAR | Status: DC | PRN
  Administered 2018-12-31: 1 mg via INTRAVENOUS
  Filled 2018-12-30: qty 1

## 2018-12-30 MED ORDER — WARFARIN SODIUM 5 MG PO TABS
5.0000 mg | ORAL_TABLET | Freq: Once | ORAL | Status: AC
  Administered 2018-12-30: 5 mg via ORAL
  Filled 2018-12-30: qty 1

## 2018-12-30 NOTE — Progress Notes (Signed)
[name] for Warfarin Indication: history of PE  Allergies  Allergen Reactions  . Other Other (See Comments)    "Cactus" blisters on tongue    Patient Measurements: Height: 5' 9.5" (176.5 cm) Weight: 187 lb 12.8 oz (85.2 kg) IBW/kg (Calculated) : 71.85  Vital Signs:    Labs: Recent Labs    12/28/18 0423 12/28/18 1103 12/29/18 0341 12/30/18 0213  HGB 10.3*  --  10.5* 10.9*  HCT 31.9*  --  31.5* 34.2*  PLT 144*  --  144* 145*  LABPROT  --  30.3* 25.7* 21.8*  INR  --  2.95 2.39 1.93  CREATININE 0.91  --  0.99 0.89    Estimated Creatinine Clearance: 90.9 mL/min (by C-G formula based on SCr of 0.89 mg/dL).   Assessment: Leonard Bentley is a 58 year old male here for cellulitis of big toe with no plans for surgery. Patient has history of PE on Warfarin 5mg  daily PTA, last dose on 2/18.  Patient's INR yesterday was >10 and received Vitamin K 5mg  for reversal.   INR down to 1.93 (dose not given 2/21)  Antibiotics continue for toe ulceration / cellulitis  Goal of Therapy:  INR 2-3 Monitor platelets by anticoagulation protocol: Yes   Plan:  Warfarin 5 mg tonight PO x1 INR / CBC in the morning  Thank you Anette Guarneri, PharmD [phone] 12/30/2018, 10:55 AM

## 2018-12-30 NOTE — Progress Notes (Signed)
PROGRESS NOTE  Leonard Bentley ZSW:[id number] DOB: February 24, 1961 DOA: 12/27/2018 PCP: [name], MD   LOS: 3 days   Brief Narrative / Interim history: 58 year old male with history of alcohol abuse, non-Hodgkin's lymphoma status post chemotherapy in 2012 with residual bilateral lower extremity neuropathy, PE on chronic anticoagulation with Coumadin, hypertension, mitral prolapse status post repair in 2017 who comes to the hospital with chief complaint of an infected ulcer of his left toe.  He was placed on broad-spectrum antibiotics and orthopedic surgery was consulted.  Subjective: Feels well, still with tremors.  Denies any fever but complains of being "hot and cold" all the time  Assessment & Plan: Active Problems:   Foot ulcer, left (HCC)   Principal Problem: Left toe ulceration with surrounding cellulitis -Continue broad-spectrum antibiotics, anticipate ability to change to p.o. in 1 [place].  Discussed with Dr. [name] over the phone 2/21, he reviewed the MRI, this is less likely osteomyelitis in his opinion and recommended oral antibiotics and outpatient follow-up -Patient requires to stay in the hospital due to withdrawals, will keep on IV antibiotics until discharge  Active problems: Alcohol abuse with seizure withdrawal -Still tremulous this morning, improved compared to yesterday.  Last alcoholic drink was less than 72-hour, will continue to closely monitor in the inpatient setting, continue Ativan. -He is determined to quit  Mild pancytopenia -Has a degree of chronic anemia in the setting of chronic illness -Anemia pane shows fairly good iron levels, folate and B12 are normal, suggesting that his pancytopenia is probably related to alcohol induced bone marrow suppression -Hemoglobin stable  History of PE -Continue Coumadin per pharmacy  History of non-Hodgkin's lymphoma -Status post treatment in 2012  Left index finger trauma -Happened as an outpatient, x-ray without  acute fractures   Scheduled Meds: . folic acid  1 mg Oral Daily  . multivitamin with minerals  1 tablet Oral Daily  . nicotine  21 mg Transdermal Daily  . nutrition supplement (JUVEN)  1 packet Oral BID BM  . ENSURE MAX PROTEIN  11 oz Oral Daily  . thiamine  100 mg Oral Daily   Or  . thiamine  100 mg Intravenous Daily  . warfarin  4 mg Oral ONCE-1800  . warfarin  5 mg Oral ONCE-1800  . Warfarin - Pharmacist Dosing Inpatient   Does not apply q1800   Continuous Infusions: . piperacillin-tazobactam (ZOSYN)  IV 3.375 g (12/30/18 0745)  . vancomycin 1,250 mg (12/30/18 0659)   PRN Meds:.LORazepam **OR** LORazepam, morphine injection, ondansetron **OR** ondansetron (ZOFRAN) IV   DVT prophylaxis: On Coumadin Code Status: Full code Family Communication: No family present at bedside Disposition Plan: Home when ready  Consultants:   Orthopedic surgery  Procedures:   None   Antimicrobials:  Vancomycin / Zosyn 2/19 >>   Objective: Vitals:   12/29/18 0426 12/29/18 1200 12/29/18 1413 12/29/18 2010  BP: (!) 127/102 126/83 (!) 126/93 (!) 133/105  Pulse: (!) 125 (!) 112 (!) 118 81  Resp: 18  16   Temp: 99 F (37.2 C)  98.3 F (36.8 C) (!) 97.5 F (36.4 C)  TempSrc: Oral  Oral Oral  SpO2: 100%  100% 100%  Weight:      Height:       No intake or output data in the 24 hours ending 12/30/18 1138 Filed Weights   12/27/18 1305  Weight: 85.2 kg    Examination:  Constitutional: Appears tremulous but improved Eyes: No scleral icterus ENMT: Moist mucous membranes Neck: normal,  supple Respiratory: Clear to auscultation bilaterally without wheezing or crackles Cardiovascular: Regular rate and rhythm, no peripheral edema Abdomen: Soft, nontender, nondistended, bowel sounds positive Skin: No new rashes, left great toe ulcer with mild surrounding cellulitis Neurologic: Equal strength, nonfocal, ambulatory   Data Reviewed: I have independently reviewed following labs and  imaging studies   CBC: Recent Labs  Lab 12/27/18 1306 12/28/18 0423 12/29/18 0341 12/30/18 0213  WBC 5.1 2.3* 2.6* 3.8*  NEUTROABS 3.6  --   --   --   HGB 12.6* 10.3* 10.5* 10.9*  HCT 38.6* 31.9* 31.5* 34.2*  MCV 82.0 80.2 81.0 83.0  PLT 139* 144* 144* 315*   Basic Metabolic Panel: Recent Labs  Lab 12/27/18 1306 12/28/18 0423 12/29/18 0341 12/30/18 0213  NA 138 139 140 139  K 4.6 4.0 3.6 3.5  CL 102 106 105 107  CO2 20* 20* 26 22  GLUCOSE 108* 88 110* 135*  BUN 9 9 11 15   CREATININE 0.89 0.91 0.99 0.89  CALCIUM 9.2 8.9 9.1 9.3   GFR: Estimated Creatinine Clearance: 90.9 mL/min (by C-G formula based on SCr of 0.89 mg/dL). Liver Function Tests: Recent Labs  Lab 12/27/18 1306 12/28/18 0423  AST 85* 64*  ALT 52* 41  ALKPHOS 54 46  BILITOT 0.5 1.4*  PROT 7.3 5.8*  ALBUMIN 4.0 3.4*   Recent Labs  Lab 12/27/18 1306  LIPASE 85*   No results for input(s): AMMONIA in the last 168 hours. Coagulation Profile: Recent Labs  Lab 12/27/18 1306 12/28/18 1103 12/29/18 0341 12/30/18 0213  INR >10.00* 2.95 2.39 1.93   Cardiac Enzymes: No results for input(s): CKTOTAL, CKMB, CKMBINDEX, TROPONINI in the last 168 hours. BNP (last 3 results) No results for input(s): PROBNP in the last 8760 hours. HbA1C: No results for input(s): HGBA1C in the last 72 hours. CBG: No results for input(s): GLUCAP in the last 168 hours. Lipid Profile: No results for input(s): CHOL, HDL, LDLCALC, TRIG, CHOLHDL, LDLDIRECT in the last 72 hours. Thyroid Function Tests: No results for input(s): TSH, T4TOTAL, FREET4, T3FREE, THYROIDAB in the last 72 hours. Anemia Panel: Recent Labs    12/29/18 0341  VITAMINB12 521  FOLATE 7.7  FERRITIN 212  TIBC 277  IRON 103  RETICCTPCT 1.3   Urine analysis:    Component Value Date/Time   COLORURINE RED (A) 11/01/2018 2149   APPEARANCEUR TURBID (A) 11/01/2018 2149   LABSPEC 1.018 11/01/2018 2149   PHURINE 7.0 11/01/2018 2149   GLUCOSEU  NEGATIVE 11/01/2018 2149   HGBUR MODERATE (A) 11/01/2018 2149   [place] NEGATIVE 11/01/2018 2149   [place] NEGATIVE 11/01/2018 2149   PROTEINUR 100 (A) 11/01/2018 2149   UROBILINOGEN 1.0 01/25/2013 0825   NITRITE NEGATIVE 11/01/2018 2149   LEUKOCYTESUR NEGATIVE 11/01/2018 2149   Sepsis Labs: Invalid input(s): PROCALCITONIN, LACTICIDVEN  No results found for this or any previous visit (from the past 240 hour(s)).    Radiology Studies: Dg Hand 2 View Left  Result Date: 12/28/2018 CLINICAL DATA:  Distal left index finger pain following a crush injury 3 weeks ago. EXAM: LEFT HAND - 2 VIEW COMPARISON:  None. FINDINGS: There is no evidence of fracture or dislocation. There is no evidence of arthropathy or other focal bone abnormality. Soft tissues are unremarkable. IMPRESSION: Negative. Electronically Signed   By: Claudie Revering M.D.   On: 12/28/2018 12:44    Marzetta Board, MD, PhD [facility]  Contact via  [email or web address]  [place] P: [phone]  F: [phone]

## 2018-12-30 NOTE — Plan of Care (Signed)

## 2018-12-31 DIAGNOSIS — L97522 Non-pressure chronic ulcer of other part of left foot with fat layer exposed: Secondary | ICD-10-CM

## 2018-12-31 DIAGNOSIS — D61818 Other pancytopenia: Secondary | ICD-10-CM

## 2018-12-31 DIAGNOSIS — Z720 Tobacco use: Secondary | ICD-10-CM

## 2018-12-31 DIAGNOSIS — F1010 Alcohol abuse, uncomplicated: Secondary | ICD-10-CM

## 2018-12-31 DIAGNOSIS — S60122D Contusion of left index finger with damage to nail, subsequent encounter: Secondary | ICD-10-CM

## 2018-12-31 LAB — PROTIME-INR
INR: 1.63
Prothrombin Time: 19.1 seconds — ABNORMAL HIGH (ref 11.4–15.2)

## 2018-12-31 MED ORDER — CHLORDIAZEPOXIDE HCL 5 MG PO CAPS: 5.0000 mg | ORAL_CAPSULE | Freq: Three times a day (TID) | ORAL | 0 refills | Status: DC | PRN

## 2018-12-31 MED ORDER — GABAPENTIN 100 MG PO CAPS: 100.0000 mg | ORAL_CAPSULE | Freq: Three times a day (TID) | ORAL | 2 refills | Status: DC

## 2018-12-31 MED ORDER — ACETAMINOPHEN 325 MG PO TABS
650.0000 mg | ORAL_TABLET | Freq: Four times a day (QID) | ORAL | Status: DC | PRN
  Administered 2018-12-31: 650 mg via ORAL
  Filled 2018-12-31: qty 2

## 2018-12-31 MED ORDER — AMOXICILLIN-POT CLAVULANATE 875-125 MG PO TABS: 1.0000 | ORAL_TABLET | Freq: Two times a day (BID) | ORAL | 0 refills | Status: DC

## 2018-12-31 NOTE — Plan of Care (Signed)

## 2018-12-31 NOTE — Discharge Instructions (Signed)
Follow with [name], MD in 5-7 days Please monitor INR in 3 days while on antibiotics  Please get a complete blood count and chemistry panel checked by your Primary MD at your next visit, and again as instructed by your Primary MD. Please get your medications reviewed and adjusted by your Primary MD.  Please request your Primary MD to go over all Hospital Tests and Procedure/Radiological results at the follow up, please get all Hospital records sent to your Prim MD by signing hospital release before you go home.  In some cases, there will be blood work, cultures and biopsy results pending at the time of your discharge. Please request that your primary care M.D. goes through all the records of your hospital data and follows up on these results.  If you had Pneumonia of Lung problems at the Hospital: Please get a 2 view Chest X ray done in 6-8 weeks after hospital discharge or sooner if instructed by your Primary MD.  If you have Congestive Heart Failure: Please call your Cardiologist or Primary MD anytime you have any of the following symptoms:  1) 3 pound weight gain in 24 hours or 5 pounds in 1 week  2) shortness of breath, with or without a dry hacking cough  3) swelling in the hands, feet or stomach  4) if you have to sleep on extra pillows at night in order to breathe  Follow cardiac low salt diet and 1.5 lit/[place] fluid restriction.  If you have diabetes Accuchecks 4 times/[place], Once in AM empty stomach and then before each meal. Log in all results and show them to your primary doctor at your next visit. If any glucose reading is under 80 or above 300 call your primary MD immediately.  If you have Seizure/Convulsions/Epilepsy: Please do not drive, operate heavy machinery, participate in activities at heights or participate in high speed sports until you have seen by Primary MD or a Neurologist and advised to do so again.  If you had Gastrointestinal Bleeding: Please ask your  Primary MD to check a complete blood count within one week of discharge or at your next visit. Your endoscopic/colonoscopic biopsies that are pending at the time of discharge, will also need to followed by your Primary MD.  Get Medicines reviewed and adjusted. Please take all your medications with you for your next visit with your Primary MD  Please request your Primary MD to go over all hospital tests and procedure/radiological results at the follow up, please ask your Primary MD to get all Hospital records sent to his/her office.  If you experience worsening of your admission symptoms, develop shortness of breath, life threatening emergency, suicidal or homicidal thoughts you must seek medical attention immediately by calling 911 or calling your MD immediately  if symptoms less severe.  You must read complete instructions/literature along with all the possible adverse reactions/side effects for all the Medicines you take and that have been prescribed to you. Take any new Medicines after you have completely understood and accpet all the possible adverse reactions/side effects.   Do not drive or operate heavy machinery when taking Pain medications.   Do not take more than prescribed Pain, Sleep and Anxiety Medications  Special Instructions: If you have smoked or chewed Tobacco  in the last 2 yrs please stop smoking, stop any regular Alcohol  and or any Recreational drug use.  Wear Seat belts while driving.  Please note You were cared for by a hospitalist during your  hospital stay. If you have any questions about your discharge medications or the care you received while you were in the hospital after you are discharged, you can call the unit and asked to speak with the hospitalist on call if the hospitalist that took care of you is not available. Once you are discharged, your primary care physician will handle any further medical issues. Please note that NO REFILLS for any discharge medications  will be authorized once you are discharged, as it is imperative that you return to your primary care physician (or establish a relationship with a primary care physician if you do not have one) for your aftercare needs so that they can reassess your need for medications and monitor your lab values.  You can reach the hospitalist office at phone [phone] or fax [phone]   If you do not have a primary care physician, you can call [phone] for a physician referral.  Activity: As tolerated with Full fall precautions use walker/cane & assistance as needed    Diet: regular  Disposition Home

## 2018-12-31 NOTE — Progress Notes (Signed)
Provided discharge education/instructions, all questions and concerns addressed, Pt not in distress, discharged home with belongings. 

## 2018-12-31 NOTE — Care Management (Addendum)
DC plan discussed with patient.  Patient states he cannot get antibiotic filled- with [facility] coupon, it is $23.  Pt used MATCH after previous visit in February.  Will override so that patient can use MATCH program again. Advised patient that [place] is only supposed to be used once/year and he needs to make another plan to fill future prescriptions.   Pt advised to use [facility] app and advised of [employer], industrial/product prescription card program.  Pt states he has been to [place] twice and knows about the pharmacy there to fill scripts during the week.

## 2018-12-31 NOTE — Discharge Summary (Signed)
Physician Discharge Summary  Leonard Bentley NWG:[id number] DOB: [birth year] DOA: 12/27/2018  PCP: [name], MD  Admit date: 12/27/2018 Discharge date: 12/31/2018  Admitted From: Home Disposition: Home  Recommendations for Outpatient Follow-up:  1. Follow up with Dr. [name] in 1 week 2. Continue Augmentin for 10 days  Home Health: None Equipment/Devices: None  Discharge Condition: Stable CODE STATUS: Full code Diet recommendation: Regular diet  HPI: Per admitting MD, Leonard Bentley  is a 58 y.o. male, with a history of pulmonary embolism on anticoagulation with Coumadin, EtOH abuse with DTs, lymphoma status post chemotherapy, hypertension mitral prolapse status post mitral valve repair 2017 went to his PCP with gangrenous-looking left big toe.  Patient says that he was discharged on antibiotics last month after he was diagnosed with cellulitis of big toe.  Patient completed his antibiotics, but continued to have worsening of infection involving left big toe.  He was sent to the ED by his PCP.  In the triage patient had a witnessed seizure-like activity x2.  He does drink 1 bottle of wine every night for past 30 years.  No previous history of seizures. He denies chest pain or shortness of breath. Denies  vomiting or diarrhea. Does complain of nausea. No history of fever or chills. Denies dysuria. In the ED x-ray of the foot showed soft tissue swelling involving the left big toe but no osteomyelitis.  Hospital Course: Principal Problem: Left toe ulceration with surrounding cellulitis -patient was admitted to the hospital with left toe ulceration with surrounding cellulitis.  Orthopedic surgery was consulted and followed patient while hospitalized.  He underwent an MRI which raised a small probability of osteomyelitis, I have discussed with Dr. [name], he reviewed the MRI and feels like this is likely osteomyelitis and recommends intravenous antibiotics for a few days then to be transitioned to oral.   He was maintained on broad-spectrum antibiotics with vancomycin and Zosyn, improved, he has been afebrile and was narrowed to Augmentin.  He will be discharged home in stable condition with close outpatient follow-up with Dr. [name] in office.  Active problems: Alcohol abuse with seizure withdrawal -patient had seizures in the emergency room in the setting of alcohol withdrawal.  He was placed on CIWA, was monitored for 3 days, his withdrawal symptoms have significantly improved, he has had no recurrent seizures.  He tells me that he is determined to quit and not drink alcohol again.  He was prescribed a short Librium course to assist with cessation Mild pancytopenia -Has a degree of chronic anemia in the setting of chronic illness. Anemia pane shows fairly good iron levels, folate and B12 are normal, suggesting that his pancytopenia is probably related to alcohol induced bone marrow suppression. Hemoglobin stable History of PE -Continue Coumadin, patient was advised to follow-up with his INR clinic in 2 to 3 days as antibiotics can interfere with Coumadin History of non-Hodgkin's lymphoma -Status post treatment in 2012 Left index finger trauma -Happened as an outpatient, x-ray without acute fractures Lower extremity neuropathy -he used to be on gabapentin in the past, thinks it it was discontinued due to abnormal labs but he is not sure.  Currently takes nothing for his neuropathy and he has significant pain.  I will start again his gabapentin and advised to follow-up with PCP   Discharge Diagnoses:  Active Problems:   Foot ulcer, left [facility])     Discharge Instructions   Allergies as of 12/31/2018      Reactions  Other Other (See Comments)   "Cactus" blisters on tongue      Medication List    STOP taking these medications   traMADol 50 MG tablet Commonly known as:  ULTRAM     TAKE these medications   acetaminophen 650 MG CR tablet Commonly known as:  TYLENOL Take 650 mg by mouth  every 8 (eight) hours as needed for pain.   amoxicillin-clavulanate 875-125 MG tablet Commonly known as:  AUGMENTIN Take 1 tablet by mouth 2 (two) times daily for 10 days.   chlordiazePOXIDE 5 MG capsule Commonly known as:  LIBRIUM Take 1 capsule (5 mg total) by mouth 3 (three) times daily as needed for anxiety.   gabapentin 100 MG capsule Commonly known as:  NEURONTIN Take 1 capsule (100 mg total) by mouth 3 (three) times daily.   warfarin 5 MG tablet Commonly known as:  COUMADIN Take as directed. If you are unsure how to take this medication, talk to your nurse or doctor. Original instructions:  Take 5 mg by mouth daily.      Follow-up Information    [name], MD. Schedule an appointment as soon as possible for a visit in 1 week(s).   Specialty:  Orthopedic Surgery Contact information: [place] [address] [phone]           Consultations:  Orthopedic surgery  Procedures/Studies:  Dg Chest 2 View  Result Date: 12/07/2018 CLINICAL DATA:  Great toe infection.  Shortness of breath. EXAM: CHEST - 2 VIEW COMPARISON:  11/01/2018 FINDINGS: Prior mitral valve replacement. Heart is upper limits normal in size. Continued reticulonodular densities project over the mid and lower lungs, right greater than left as shown on prior study and CT. No confluent airspace opacities or effusions. No acute bony abnormality. IMPRESSION: Stable chronic reticulonodular densities in the lungs, right greater than left, favor chronic infectious process or inflammation. No acute cardiopulmonary disease. Electronically Signed   By: Rolm Baptise M.D.   On: 12/07/2018 18:09   Dg Hand 2 View Left  Result Date: 12/28/2018 CLINICAL DATA:  Distal left index finger pain following a crush injury 3 weeks ago. EXAM: LEFT HAND - 2 VIEW COMPARISON:  None. FINDINGS: There is no evidence of fracture or dislocation. There is no evidence of arthropathy or other focal bone  abnormality. Soft tissues are unremarkable. IMPRESSION: Negative. Electronically Signed   By: Claudie Revering M.D.   On: 12/28/2018 12:44   Mr Foot Left [name]  Result Date: 12/27/2018 CLINICAL DATA:  Worsening infection of the left great toe despite antibiotics. EXAM: MRI OF THE LEFT FOREFOOT WITHOUT AND WITH CONTRAST TECHNIQUE: Multiplanar, multisequence MR imaging of the left foot was performed both before and after administration of intravenous contrast. CONTRAST:  8 mm mild Gadavist IV COMPARISON:  Left foot radiographs 12/07/2018 and great toe radiographs 12/27/2018 FINDINGS: Bones/Joint/Cartilage Subtle cortical bone loss along the dorsal medial aspect of the distal phalanx of the left great toe, series 4/11. Marrow edema of the distal phalanx concerning for early changes of osteomyelitis are noted, series 8/15. No acute fracture joint dislocations. Subcortical cyst involving the lateral cuneiform is identified measuring approximately 8 x 5 x 6 mm. Ligaments Negative Muscles and Tendons No evidence of pyomyositis. Mild intramuscular edema likely neurogenic. Soft tissues Small ganglion cyst along the dorsum of the first proximal phalanx measuring approximately 11 x 6 x 9 mm. Diffuse soft tissue edema is noted consistent with cellulitis. IMPRESSION: 1. Diffuse subcutaneous soft tissue  edema the forefoot consistent with history of cellulitis. 2. Subtle cortical bone loss along the dorsal medial aspect of the distal phalanx of the great toe more uniform marrow edema of the distal phalanx raising concern for changes of osteomyelitis. 3. Small ganglion cyst along the dorsum the first proximal phalanx measuring 11 x 6 x 9 mm. 4. Degenerative subcortical cyst of the lateral cuneiform measuring 8 x 5 x 6 mm. Electronically Signed   By: Ashley Royalty M.D.   On: 12/27/2018 21:28   Dg Foot Complete Left  Result Date: 12/07/2018 CLINICAL DATA:  Great toe pain. EXAM: LEFT FOOT - COMPLETE 3+ VIEW COMPARISON:   None. FINDINGS: There is no evidence of fracture or dislocation. There is no evidence of arthropathy or other focal bone abnormality. Soft tissues are unremarkable. IMPRESSION: Negative. Electronically Signed   By: Rolm Baptise M.D.   On: 12/07/2018 18:10   Dg Toe Great Left  Result Date: 12/27/2018 CLINICAL DATA:  necrotic great toe, please eval for possible osteomyelitis/gasPatient reports pain in left great toe for 3 weeks. Patient states it started as blister along dorsal surface of great toe 3 weeks ago, he placed a band-aid on it and 2 weeks ago his distal great toe started to turn black. Pain is localized to great toe and does not radiate into rest of foot. No prior surgery or injury to great toe. EXAM: LEFT GREAT TOE COMPARISON:  12/07/2018 FINDINGS: There is no evidence of fracture or dislocation. No focal cortical erosion to suggest osteomyelitis. There is no evidence of arthropathy or other focal bone abnormality. Soft tissue swelling about the distal phalanx. No subcutaneous gas or radiodense foreign body. IMPRESSION: 1. Soft tissue swelling without bony abnormality. 2. No radiographic evidence of osteomyelitis. The Electronically Signed   By: Lucrezia Europe M.D.   On: 12/27/2018 14:55      Subjective: - no chest pain, shortness of breath, no abdominal pain, nausea or vomiting.   Discharge Exam: BP 109/83 (BP Location: Left Arm)   Pulse 92   Temp 97.7 F (36.5 C) (Oral)   Resp 15   Ht 5' 9.5" (1.765 m)   Wt 85.2 kg   SpO2 100%   BMI 27.34 kg/m   General: Pt is alert, awake, not in acute distress Cardiovascular: RRR, S1/S2 +, no rubs, no gallops Respiratory: CTA bilaterally, no wheezing, no rhonchi Abdominal: Soft, NT, ND, bowel sounds + Extremities: no edema, no cyanosis    The results of significant diagnostics from this hospitalization (including imaging, microbiology, ancillary and laboratory) are listed below for reference.     Microbiology: No results found for this  or any previous visit (from the past 240 hour(s)).   Labs: BNP (last 3 results) No results for input(s): BNP in the last 8760 hours. Basic Metabolic Panel: Recent Labs  Lab 12/27/18 1306 12/28/18 0423 12/29/18 0341 12/30/18 0213  NA 138 139 140 139  K 4.6 4.0 3.6 3.5  CL 102 106 105 107  CO2 20* 20* 26 22  GLUCOSE 108* 88 110* 135*  BUN 9 9 11 15   CREATININE 0.89 0.91 0.99 0.89  CALCIUM 9.2 8.9 9.1 9.3   Liver Function Tests: Recent Labs  Lab 12/27/18 1306 12/28/18 0423  AST 85* 64*  ALT 52* 41  ALKPHOS 54 46  BILITOT 0.5 1.4*  PROT 7.3 5.8*  ALBUMIN 4.0 3.4*   Recent Labs  Lab 12/27/18 1306  LIPASE 85*   No results for input(s): AMMONIA in the last  168 hours. CBC: Recent Labs  Lab 12/27/18 1306 12/28/18 0423 12/29/18 0341 12/30/18 0213  WBC 5.1 2.3* 2.6* 3.8*  NEUTROABS 3.6  --   --   --   HGB 12.6* 10.3* 10.5* 10.9*  HCT 38.6* 31.9* 31.5* 34.2*  MCV 82.0 80.2 81.0 83.0  PLT 139* 144* 144* 145*   Cardiac Enzymes: No results for input(s): CKTOTAL, CKMB, CKMBINDEX, TROPONINI in the last 168 hours. BNP: Invalid input(s): POCBNP CBG: No results for input(s): GLUCAP in the last 168 hours. D-Dimer No results for input(s): DDIMER in the last 72 hours. Hgb A1c No results for input(s): HGBA1C in the last 72 hours. Lipid Profile No results for input(s): CHOL, HDL, LDLCALC, TRIG, CHOLHDL, LDLDIRECT in the last 72 hours. Thyroid function studies No results for input(s): TSH, T4TOTAL, T3FREE, THYROIDAB in the last 72 hours.  Invalid input(s): FREET3 Anemia work up Recent Labs    12/29/18 0341  VITAMINB12 521  FOLATE 7.7  FERRITIN 212  TIBC 277  IRON 103  RETICCTPCT 1.3   Urinalysis    Component Value Date/Time   COLORURINE RED (A) 11/01/2018 2149   APPEARANCEUR TURBID (A) 11/01/2018 2149   LABSPEC 1.018 11/01/2018 2149   PHURINE 7.0 11/01/2018 2149   GLUCOSEU NEGATIVE 11/01/2018 2149   HGBUR MODERATE (A) 11/01/2018 2149   [place]  NEGATIVE 11/01/2018 2149   [place] NEGATIVE 11/01/2018 2149   PROTEINUR 100 (A) 11/01/2018 2149   UROBILINOGEN 1.0 01/25/2013 0825   NITRITE NEGATIVE 11/01/2018 2149   LEUKOCYTESUR NEGATIVE 11/01/2018 2149   Sepsis Labs Invalid input(s): PROCALCITONIN,  WBC,  LACTICIDVEN  FURTHER DISCHARGE INSTRUCTIONS:   Get Medicines reviewed and adjusted: Please take all your medications with you for your next visit with your Primary MD   Laboratory/radiological data: Please request your Primary MD to go over all hospital tests and procedure/radiological results at the follow up, please ask your Primary MD to get all Hospital records sent to his/her office.   In some cases, they will be blood work, cultures and biopsy results pending at the time of your discharge. Please request that your primary care M.D. goes through all the records of your hospital data and follows up on these results.   Also Note the following: If you experience worsening of your admission symptoms, develop shortness of breath, life threatening emergency, suicidal or homicidal thoughts you must seek medical attention immediately by calling 911 or calling your MD immediately  if symptoms less severe.   You must read complete instructions/literature along with all the possible adverse reactions/side effects for all the Medicines you take and that have been prescribed to you. Take any new Medicines after you have completely understood and accpet all the possible adverse reactions/side effects.    Do not drive when taking Pain medications or sleeping medications (Benzodaizepines)   Do not take more than prescribed Pain, Sleep and Anxiety Medications. It is not advisable to combine anxiety,sleep and pain medications without talking with your primary care practitioner   Special Instructions: If you have smoked or chewed Tobacco  in the last 2 yrs please stop smoking, stop any regular Alcohol  and or any Recreational drug use.   Wear  Seat belts while driving.   Please note: You were cared for by a hospitalist during your hospital stay. Once you are discharged, your primary care physician will handle any further medical issues. Please note that NO REFILLS for any discharge medications will be authorized once you are discharged, as it is imperative  that you return to your primary care physician (or establish a relationship with a primary care physician if you do not have one) for your post hospital discharge needs so that they can reassess your need for medications and monitor your lab values.  Time coordinating discharge: 40 minutes  SIGNED:  Marzetta Board, MD  [facility] 12/31/2018, 11:11 AM

## 2019-01-08 ENCOUNTER — Ambulatory Visit: Payer: Self-pay | Admitting: Pharmacist

## 2019-01-09 ENCOUNTER — Ambulatory Visit (INDEPENDENT_AMBULATORY_CARE_PROVIDER_SITE_OTHER): Payer: Self-pay | Admitting: Orthopedic Surgery

## 2019-01-09 ENCOUNTER — Emergency Department (HOSPITAL_COMMUNITY): Payer: Self-pay

## 2019-01-09 ENCOUNTER — Encounter (INDEPENDENT_AMBULATORY_CARE_PROVIDER_SITE_OTHER): Payer: Self-pay | Admitting: Orthopedic Surgery

## 2019-01-09 ENCOUNTER — Encounter (HOSPITAL_COMMUNITY): Payer: Self-pay | Admitting: Emergency Medicine

## 2019-01-09 ENCOUNTER — Inpatient Hospital Stay (HOSPITAL_COMMUNITY)
Admission: EM | Admit: 2019-01-09 | Discharge: 2019-01-21 | DRG: 504 | Disposition: A | Discharge status: Home Health | Payer: Self-pay | Source: Ambulatory Visit | Attending: Internal Medicine | Admitting: Internal Medicine

## 2019-01-09 ENCOUNTER — Telehealth (INDEPENDENT_AMBULATORY_CARE_PROVIDER_SITE_OTHER): Payer: Self-pay | Admitting: Orthopedic Surgery

## 2019-01-09 VITALS — Ht 69.0 in | Wt 187.0 lb

## 2019-01-09 DIAGNOSIS — I471 Supraventricular tachycardia, unspecified: Secondary | ICD-10-CM | POA: Diagnosis present

## 2019-01-09 DIAGNOSIS — I739 Peripheral vascular disease, unspecified: Secondary | ICD-10-CM | POA: Diagnosis present

## 2019-01-09 DIAGNOSIS — F17210 Nicotine dependence, cigarettes, uncomplicated: Secondary | ICD-10-CM | POA: Diagnosis present

## 2019-01-09 DIAGNOSIS — Z809 Family history of malignant neoplasm, unspecified: Secondary | ICD-10-CM

## 2019-01-09 DIAGNOSIS — I10 Essential (primary) hypertension: Secondary | ICD-10-CM | POA: Diagnosis present

## 2019-01-09 DIAGNOSIS — M86172 Other acute osteomyelitis, left ankle and foot: Principal | ICD-10-CM | POA: Diagnosis present

## 2019-01-09 DIAGNOSIS — F419 Anxiety disorder, unspecified: Secondary | ICD-10-CM | POA: Diagnosis present

## 2019-01-09 DIAGNOSIS — F1010 Alcohol abuse, uncomplicated: Secondary | ICD-10-CM | POA: Diagnosis present

## 2019-01-09 DIAGNOSIS — Z8249 Family history of ischemic heart disease and other diseases of the circulatory system: Secondary | ICD-10-CM

## 2019-01-09 DIAGNOSIS — L97524 Non-pressure chronic ulcer of other part of left foot with necrosis of bone: Secondary | ICD-10-CM | POA: Diagnosis present

## 2019-01-09 DIAGNOSIS — Z79899 Other long term (current) drug therapy: Secondary | ICD-10-CM

## 2019-01-09 DIAGNOSIS — Z823 Family history of stroke: Secondary | ICD-10-CM

## 2019-01-09 DIAGNOSIS — M869 Osteomyelitis, unspecified: Secondary | ICD-10-CM | POA: Diagnosis present

## 2019-01-09 DIAGNOSIS — I2699 Other pulmonary embolism without acute cor pulmonale: Secondary | ICD-10-CM | POA: Diagnosis present

## 2019-01-09 DIAGNOSIS — Z86711 Personal history of pulmonary embolism: Secondary | ICD-10-CM

## 2019-01-09 DIAGNOSIS — Z8572 Personal history of non-Hodgkin lymphomas: Secondary | ICD-10-CM

## 2019-01-09 DIAGNOSIS — F10239 Alcohol dependence with withdrawal, unspecified: Secondary | ICD-10-CM | POA: Diagnosis present

## 2019-01-09 LAB — BASIC METABOLIC PANEL
Anion gap: 9 (ref 5–15)
BUN: 14 mg/dL (ref 6–20)
CO2: 26 mmol/L (ref 22–32)
Calcium: 9.3 mg/dL (ref 8.9–10.3)
Chloride: 103 mmol/L (ref 98–111)
Creatinine, Ser: 0.85 mg/dL (ref 0.61–1.24)
GFR calc Af Amer: >60 mL/min (ref >60)
GFR calc non Af Amer: >60 mL/min (ref >60)
Glucose, Bld: 98 mg/dL (ref 70–99)
Potassium: 4.3 mmol/L (ref 3.5–5.1)
Sodium: 138 mmol/L (ref 135–145)

## 2019-01-09 LAB — CBC WITH DIFFERENTIAL/PLATELET
Abs Immature Granulocytes: 0 10*3/uL (ref 0.00–0.07)
Basophils Absolute: 0.1 10*3/uL (ref 0.0–0.1)
Basophils Relative: 2 %
Eosinophils Absolute: 0.1 10*3/uL (ref 0.0–0.5)
Eosinophils Relative: 2 %
HCT: 34.7 % — ABNORMAL LOW (ref 39.0–52.0)
Hemoglobin: 11.3 g/dL — ABNORMAL LOW (ref 13.0–17.0)
Immature Granulocytes: 0 %
Lymphocytes Relative: 11 %
Lymphs Abs: 0.4 10*3/uL — ABNORMAL LOW (ref 0.7–4.0)
MCH: 26.5 pg (ref 26.0–34.0)
MCHC: 32.6 g/dL (ref 30.0–36.0)
MCV: 81.3 fL (ref 80.0–100.0)
Monocytes Absolute: 0.8 10*3/uL (ref 0.1–1.0)
Monocytes Relative: 20 %
Neutro Abs: 2.5 10*3/uL (ref 1.7–7.7)
Neutrophils Relative %: 65 %
Platelets: 251 10*3/uL (ref 150–400)
RBC: 4.27 MIL/uL (ref 4.22–5.81)
RDW: 15.8 % — ABNORMAL HIGH (ref 11.5–15.5)
WBC: 3.9 10*3/uL — ABNORMAL LOW (ref 4.0–10.5)
nRBC: 0.5 % — ABNORMAL HIGH (ref 0.0–0.2)

## 2019-01-09 LAB — LACTIC ACID, PLASMA: Lactic Acid, Venous: 1.4 mmol/L (ref 0.5–1.9)

## 2019-01-09 MED ORDER — KETOROLAC TROMETHAMINE 15 MG/ML IJ SOLN
15.0000 mg | Freq: Four times a day (QID) | INTRAMUSCULAR | Status: AC | PRN
  Administered 2019-01-10 – 2019-01-14 (×3): 15 mg via INTRAVENOUS
  Filled 2019-01-09 (×4): qty 1

## 2019-01-09 MED ORDER — VANCOMYCIN HCL 10 G IV SOLR
1750.0000 mg | INTRAVENOUS | Status: AC
  Administered 2019-01-09: 1750 mg via INTRAVENOUS
  Filled 2019-01-09: qty 1750

## 2019-01-09 MED ORDER — SODIUM CHLORIDE 0.9 % IV SOLN
2.0000 g | Freq: Three times a day (TID) | INTRAVENOUS | Status: DC
  Administered 2019-01-10 – 2019-01-15 (×16): 2 g via INTRAVENOUS
  Filled 2019-01-09 (×18): qty 2

## 2019-01-09 MED ORDER — ONDANSETRON HCL 4 MG PO TABS: 4.0000 mg | ORAL_TABLET | Freq: Four times a day (QID) | ORAL | Status: DC | PRN

## 2019-01-09 MED ORDER — ONDANSETRON HCL 4 MG/2ML IJ SOLN
4.0000 mg | Freq: Four times a day (QID) | INTRAMUSCULAR | Status: DC | PRN
  Administered 2019-01-12: 4 mg via INTRAVENOUS

## 2019-01-09 MED ORDER — THIAMINE HCL 100 MG/ML IJ SOLN
100.0000 mg | Freq: Every day | INTRAMUSCULAR | Status: DC
  Administered 2019-01-09: 100 mg via INTRAVENOUS
  Filled 2019-01-09 (×2): qty 2

## 2019-01-09 MED ORDER — MORPHINE SULFATE (PF) 4 MG/ML IV SOLN
4.0000 mg | Freq: Once | INTRAVENOUS | Status: AC
  Administered 2019-01-09: 4 mg via INTRAVENOUS
  Filled 2019-01-09: qty 1

## 2019-01-09 MED ORDER — LORAZEPAM 1 MG PO TABS
0.0000 mg | ORAL_TABLET | Freq: Four times a day (QID) | ORAL | Status: AC
  Administered 2019-01-10 – 2019-01-11 (×4): 1 mg via ORAL
  Filled 2019-01-09 (×4): qty 1

## 2019-01-09 MED ORDER — VANCOMYCIN HCL 10 G IV SOLR
1250.0000 mg | Freq: Two times a day (BID) | INTRAVENOUS | Status: DC
  Administered 2019-01-10 – 2019-01-14 (×9): 1250 mg via INTRAVENOUS
  Filled 2019-01-09 (×9): qty 1250

## 2019-01-09 MED ORDER — LORAZEPAM 1 MG PO TABS
0.0000 mg | ORAL_TABLET | Freq: Two times a day (BID) | ORAL | Status: AC
  Administered 2019-01-12: 2 mg via ORAL
  Administered 2019-01-12: 1 mg via ORAL
  Administered 2019-01-13: 2 mg via ORAL
  Filled 2019-01-09: qty 4
  Filled 2019-01-09: qty 1
  Filled 2019-01-09: qty 2

## 2019-01-09 MED ORDER — SODIUM CHLORIDE 0.45 % IV SOLN
INTRAVENOUS | Status: DC
  Administered 2019-01-09 – 2019-01-11 (×2): via INTRAVENOUS

## 2019-01-09 MED ORDER — LORAZEPAM 2 MG/ML IJ SOLN: 0.0000 mg | Freq: Two times a day (BID) | INTRAMUSCULAR | Status: AC

## 2019-01-09 MED ORDER — VITAMIN B-1 100 MG PO TABS
100.0000 mg | ORAL_TABLET | Freq: Every day | ORAL | Status: DC
  Administered 2019-01-10 – 2019-01-21 (×12): 100 mg via ORAL
  Filled 2019-01-09 (×12): qty 1

## 2019-01-09 MED ORDER — LORAZEPAM 2 MG/ML IJ SOLN
0.0000 mg | Freq: Four times a day (QID) | INTRAMUSCULAR | Status: AC
  Administered 2019-01-09 – 2019-01-11 (×4): 1 mg via INTRAVENOUS
  Filled 2019-01-09 (×4): qty 1

## 2019-01-09 MED ORDER — ONDANSETRON HCL 4 MG/2ML IJ SOLN
4.0000 mg | Freq: Once | INTRAMUSCULAR | Status: AC
  Administered 2019-01-09: 4 mg via INTRAVENOUS
  Filled 2019-01-09: qty 2

## 2019-01-09 MED ORDER — SODIUM CHLORIDE 0.9 % IV BOLUS
1000.0000 mL | Freq: Once | INTRAVENOUS | Status: AC
  Administered 2019-01-09: 1000 mL via INTRAVENOUS

## 2019-01-09 NOTE — ED Triage Notes (Signed)
Pt BIB [facility] from ortho follow up appointment. Pt stated that he went for a follow up today for a wound after he finished his antibiotics. Pt stated that Dr. [name] asked him to come to the ER for evaluation. Pt has a wound on his left great toe for the last 4 weeks.

## 2019-01-09 NOTE — Progress Notes (Signed)
A consult was received from an ED physician for Vancomycin per pharmacy dosing.  The patient's profile has been reviewed for ht/wt/allergies/indication/available labs.    A one time order has been placed for Vancomycin 1750mg  IV.  Further antibiotics/pharmacy consults should be ordered by admitting physician if indicated.                       Thank you, Everette Rank, PharmD 01/09/2019  8:46 PM

## 2019-01-09 NOTE — ED Notes (Signed)
ED TO INPATIENT HANDOFF REPORT  ED Nurse Name and Phone #: Earnest Bailey [phone]  S Name/Age/Gender Leonard Bentley 58 y.o. male Room/Bed: WHALD/WHALD  Code Status   Code Status: Prior  Home/SNF/Other Home Patient oriented to: self Is this baseline? Yes   Triage Complete: Triage complete  Chief Complaint Left Toe Infection   Triage Note Pt BIB [facility] from ortho follow up appointment. Pt stated that he went for a follow up today for a wound after he finished his antibiotics. Pt stated that Dr. [name] asked him to come to the ER for evaluation. Pt has a wound on his left great toe for the last 4 weeks.      Allergies Allergies  Allergen Reactions  . Other Other (See Comments)    "Cactus" blisters on tongue    Level of Care/Admitting Diagnosis ED Disposition    ED Disposition Condition [place]: [place] [[id number]]  Level of Care: Med-Surg [16]  Diagnosis: Osteomyelitis of great toe of left foot [facility]) [[id number]]  Admitting Physician: Elwyn Reach [[id number]]  Attending Physician: Elwyn Reach [[id number]]  Estimated length of stay: past midnight tomorrow  Certification:: I certify this patient will need inpatient services for at least 2 midnights  PT Class (Do Not Modify): Inpatient [101]  PT Acc Code (Do Not Modify): Private [1]       B Medical/Surgery History Past Medical History:  Diagnosis Date  . Acute pulmonary embolism ([place]) 04/11/2016  . Anxiety   . Arthritis   . Atrial tachycardia ([place]) 01/06/2016  . Depression   . DTs (delirium tremens) ([place])   . Dyspnea   . ED (erectile dysfunction)   . ETOH abuse   . Hypertension   . Lymphoma, non Hodgkin's 09/25/2011   Stage 2  . Mitral regurgitation 01/20/2016  . [place]itz I 10/09/2015  . Murmur 01/06/2016  . Occasional tremors   . PAC (premature atrial contraction) 10/09/2015  . S/P minimally-invasive mitral valve repair 10/27/2016   Complex valvuloplasty including artificial Gore-tex  neochord placement x6 and 34 mm Edwards Physio II ring annuloplasty via right mini thoracotomy approach   Past Surgical History:  Procedure Laterality Date  . CARDIAC CATHETERIZATION N/A 08/13/2016   Procedure: Right/Left Heart Cath and Coronary Angiography;  Surgeon: [name], MD;  Location: [facility];  Service: Cardiovascular;  Laterality: N/A;  . MITRAL VALVE REPAIR Right 10/27/2016   Procedure: MINIMALLY INVASIVE MITRAL VALVE REPAIR (MVR) USING 34 PHYSIO II ANNULOPLASTY RING;  Surgeon: [name], MD;  Location: [facility];  Service: Open Heart Surgery;  Laterality: Right;  . SKIN BIOPSY Right 04/04/2018   right mid medial anterior tibial shave  see report in chart  . TEE WITHOUT CARDIOVERSION N/A 02/11/2016   Procedure: TRANSESOPHAGEAL ECHOCARDIOGRAM (TEE);  Surgeon: [name], MD;  Location: [facility];  Service: Cardiovascular;  Laterality: N/A;  . TEE WITHOUT CARDIOVERSION N/A 10/27/2016   Procedure: TRANSESOPHAGEAL ECHOCARDIOGRAM (TEE);  Surgeon: [name], MD;  Location: [facility];  Service: Open Heart Surgery;  Laterality: N/A;  . TRIGGER FINGER RELEASE Bilateral      A IV Location/Drains/Wounds Patient Lines/Drains/Airways Status   Active Line/Drains/Airways    Name:   Placement date:   Placement time:   Site:   Days:   Peripheral IV 01/09/19 Left Antecubital   01/09/19    1830    Antecubital   less than 1   Wound / Incision (Open or Dehisced) 12/27/18  Toe (Comment  which one) Left   12/27/18    1848    Toe (Comment  which one)   13          Intake/Output Last 24 hours No intake or output data in the 24 hours ending 01/09/19 2201  Labs/Imaging Results for orders placed or performed during the hospital encounter of 01/09/19 (from the past 48 hour(s))  CBC with Differential     Status: Abnormal   Collection Time: 01/09/19  6:07 PM  Result Value Ref Range   WBC 3.9 (L) 4.0 - 10.5 K/uL   RBC 4.27 4.22 - 5.81 MIL/uL   Hemoglobin 11.3 (L) 13.0  - 17.0 g/dL   HCT 34.7 (L) 39.0 - 52.0 %   MCV 81.3 80.0 - 100.0 fL   MCH 26.5 26.0 - 34.0 pg   MCHC 32.6 30.0 - 36.0 g/dL   RDW 15.8 (H) 11.5 - 15.5 %   Platelets 251 150 - 400 K/uL   nRBC 0.5 (H) 0.0 - 0.2 %   Neutrophils Relative % 65 %   Neutro Abs 2.5 1.7 - 7.7 K/uL   Lymphocytes Relative 11 %   Lymphs Abs 0.4 (L) 0.7 - 4.0 K/uL   Monocytes Relative 20 %   Monocytes Absolute 0.8 0.1 - 1.0 K/uL   Eosinophils Relative 2 %   Eosinophils Absolute 0.1 0.0 - 0.5 K/uL   Basophils Relative 2 %   Basophils Absolute 0.1 0.0 - 0.1 K/uL   Immature Granulocytes 0 %   Abs Immature Granulocytes 0.00 0.00 - 0.07 K/uL    Comment: Performed at [facility], [place] [address] [place], [place] [address]  Basic metabolic panel     Status: None   Collection Time: 01/09/19  6:07 PM  Result Value Ref Range   Sodium 138 135 - 145 mmol/L   Potassium 4.3 3.5 - 5.1 mmol/L   Chloride 103 98 - 111 mmol/L   CO2 26 22 - 32 mmol/L   Glucose, Bld 98 70 - 99 mg/dL   BUN 14 6 - 20 mg/dL   Creatinine, Ser 0.85 0.61 - 1.24 mg/dL   Calcium 9.3 8.9 - 10.3 mg/dL   GFR calc non Af Amer >60 >60 mL/min   GFR calc Af Amer >60 >60 mL/min   Anion gap 9 5 - 15    Comment: Performed at [facility], [place] [address] [place], [place] [address]  Lactic acid, plasma     Status: None   Collection Time: 01/09/19  6:27 PM  Result Value Ref Range   Lactic Acid, Venous 1.4 0.5 - 1.9 mmol/L    Comment: Performed at [facility], [place] [address], [place], [place] [address]   Dg Foot Complete Left  Result Date: 01/09/2019 CLINICAL DATA:  Great toe wound for 4 weeks. Pain and infection. EXAM: LEFT FOOT - COMPLETE 3+ VIEW COMPARISON:  Great toe radiographs and forefoot MRI 12/27/2018 FINDINGS: No bony destructive change. Equivocal decreased density involving the lateral aspect of the great toe distal tuft at site of osteomyelitis on prior MRI. No periosteal reaction or frank  osseous erosions. No soft tissue air or radiopaque foreign body. Soft tissue thickening of the digit. Hammertoe deformity of the second through fifth digits. No findings elsewhere to suggest osteomyelitis. Well corticated density about the dorsal talus may represent sequela of remote prior injury or accessory ossicle. IMPRESSION: Equivocal decreased density involving the lateral aspect of the great toe distal tuft  at site of osteomyelitis on prior MRI. No frank bony destructive change. No soft tissue air or radiopaque foreign body. Electronically Signed   By: Keith Rake M.D.   On: 01/09/2019 19:07    Pending Labs Unresulted Labs (From admission, onward)    Start     Ordered   01/09/19 1807  Culture, blood (routine x 2)  BLOOD CULTURE X 2,   STAT     01/09/19 1806          Vitals/Pain Today's Vitals   01/09/19 1920 01/09/19 2056 01/09/19 2123 01/09/19 2134  BP: 114/86 (!) 116/96 (!) 117/104   Pulse: (!) 132 (!) 128 (!) 124   Resp: 20 12    Temp:      TempSrc:      SpO2: 98% 97%    PainSc:    7     Isolation Precautions No active isolations  Medications Medications  vancomycin (VANCOCIN) 1,750 mg in sodium chloride 0.9 % 500 mL IVPB (1,750 mg Intravenous New Bag/Given 01/09/19 2128)  LORazepam (ATIVAN) injection 0-4 mg (1 mg Intravenous Given 01/09/19 2129)    Or  LORazepam (ATIVAN) tablet 0-4 mg ( Oral See Alternative 01/09/19 2129)  LORazepam (ATIVAN) injection 0-4 mg (has no administration in time range)    Or  LORazepam (ATIVAN) tablet 0-4 mg (has no administration in time range)  thiamine (VITAMIN B-1) tablet 100 mg ( Oral See Alternative 01/09/19 2128)    Or  thiamine (B-1) injection 100 mg (100 mg Intravenous Given 01/09/19 2128)  ondansetron (ZOFRAN) injection 4 mg (4 mg Intravenous Given 01/09/19 1912)  morphine 4 MG/ML injection 4 mg (4 mg Intravenous Given 01/09/19 1912)  sodium chloride 0.9 % bolus 1,000 mL (1,000 mLs Intravenous New Bag/Given 01/09/19 2124)     Mobility walks Low fall risk   Focused Assessments    R Recommendations: See Admitting Provider Note  Report given to: Louie Casa RN   Additional Notes: [facility] to transfer

## 2019-01-09 NOTE — ED Notes (Signed)
[facility] called for transport. 

## 2019-01-09 NOTE — ED Notes (Addendum)
EDPA Shawn aware of HR previously charted @ 2056

## 2019-01-09 NOTE — ED Provider Notes (Addendum)
[name] DEPT Provider Note   CSN: [id number] Arrival date & time: 01/09/19  1628    History   Chief Complaint Chief Complaint  Patient presents with  . Wound Infection    HPI Leonard Bentley is a 58 y.o. male.     HPI   Leonard Bentley is a 58 y.o. male, with a history of mitral regurg, PE, HTN, non-Hodgkin's lymphoma in remission, presenting to the ED with left big toe pain for the last couple weeks.  States he has been taking Augmentin for the past 10 days.  His pain has continued to increase.  It is currently sharp and burning, 8/10, radiating toward the foot. He states he is under the care of Dr. [name] with [facility].  He was seen in the office today for checkup on the wound.  Dr. [name] had concerns for persistent and perhaps worsening infection despite the oral antibiotics and advised the patient to go to the emergency department. Patient states he had originally discussed amputation with Dr. [name] as a possibility, but they opted for trying antibiotics first. Patient endorses intermittent fever. He also notes he is a daily alcohol drinker.  Consumes about a bottle of wine a night with his last alcohol last night. Denies N/V/D, seizures, confusion, diaphoresis, weakness, chest pain, shortness of breath, abdominal pain, or any other complaints.    Past Medical History:  Diagnosis Date  . Acute pulmonary embolism ([place]) 04/11/2016  . Anxiety   . Arthritis   . Atrial tachycardia ([place]) 01/06/2016  . Depression   . DTs (delirium tremens) ([place])   . Dyspnea   . ED (erectile dysfunction)   . ETOH abuse   . Hypertension   . Lymphoma, non Hodgkin's 09/25/2011   Stage 2  . Mitral regurgitation 01/20/2016  . Mobitz I 10/09/2015  . Murmur 01/06/2016  . Occasional tremors   . PAC (premature atrial contraction) 10/09/2015  . S/P minimally-invasive mitral valve repair 10/27/2016   Complex valvuloplasty including artificial Gore-tex neochord placement  x6 and 34 mm Edwards Physio II ring annuloplasty via right mini thoracotomy approach    Patient Active Problem List   Diagnosis Date Noted  . Osteomyelitis of great toe of left foot ([place]) 01/09/2019  . Foot ulcer, left ([place]) 12/27/2018  . Cellulitis 12/07/2018  . Bleeding from left ear 11/28/2018  . HTN (hypertension) 11/02/2018  . UTI (urinary tract infection) 11/02/2018  . Ecchymosis   . Tachyarrhythmia 08/30/2018  . Hypomagnesemia 08/30/2018  . Recurrent pulmonary emboli ([place])   . Hematuria   . Delirium tremens ([place])   . LFTs abnormal   . Alcoholic hepatitis without ascites   . Alcohol withdrawal ([place]) 06/04/2018  . Dizziness   . Posterior tibial tendon dysfunction 02/21/2018  . Supratherapeutic INR 11/27/2016  . Dehydration 11/27/2016  . SVT (supraventricular tachycardia) ([place])   . S/P minimally-invasive mitral valve repair 10/27/2016  . History of pulmonary embolus (PE) 04/11/2016  . Left sided numbness   . Anxiety 04/02/2016  . Panic attacks 04/02/2016  . Mitral valve prolapse   . Mitral regurgitation 01/20/2016  . Atrial tachycardia ([place]) 01/06/2016  . Murmur 01/06/2016  . Mobitz I 10/09/2015  . PAC (premature atrial contraction) 10/09/2015  . Arthritis 08/14/2015  . Neuropathy due to chemotherapeutic drug ([place]) 03/09/2013  . Leucopenia 01/31/2013  . Personal history of pulmonary embolism 12/28/2012  . Alcohol abuse 08/24/2012  . Cigarette smoker 08/24/2012  . Hemorrhoid 03/08/2012  . Anemia 12/10/2011  .  History of non-Hodgkin's lymphoma 09/25/2011    Past Surgical History:  Procedure Laterality Date  . CARDIAC CATHETERIZATION N/A 08/13/2016   Procedure: Right/Left Heart Cath and Coronary Angiography;  Surgeon: [name], MD;  Location: [facility];  Service: Cardiovascular;  Laterality: N/A;  . MITRAL VALVE REPAIR Right 10/27/2016   Procedure: MINIMALLY INVASIVE MITRAL VALVE REPAIR (MVR) USING 34 PHYSIO II ANNULOPLASTY RING;  Surgeon:  [name], MD;  Location: [facility];  Service: Open Heart Surgery;  Laterality: Right;  . SKIN BIOPSY Right 04/04/2018   right mid medial anterior tibial shave  see report in chart  . TEE WITHOUT CARDIOVERSION N/A 02/11/2016   Procedure: TRANSESOPHAGEAL ECHOCARDIOGRAM (TEE);  Surgeon: [name], MD;  Location: [facility];  Service: Cardiovascular;  Laterality: N/A;  . TEE WITHOUT CARDIOVERSION N/A 10/27/2016   Procedure: TRANSESOPHAGEAL ECHOCARDIOGRAM (TEE);  Surgeon: [name], MD;  Location: [facility];  Service: Open Heart Surgery;  Laterality: N/A;  . TRIGGER FINGER RELEASE Bilateral         Home Medications    Prior to Admission medications   Medication Sig Start Date End Date Taking? Authorizing Provider  acetaminophen (TYLENOL) 650 MG CR tablet Take 650 mg by mouth every 8 (eight) hours as needed for pain.   Yes [provider]  gabapentin (NEURONTIN) 100 MG capsule Take 1 capsule (100 mg total) by mouth 3 (three) times daily. Patient taking differently: Take 100 mg by mouth 2 (two) times daily.  12/31/18 12/31/19 Yes Gherghe, Vella Redhead, MD  warfarin (COUMADIN) 5 MG tablet Take 5 mg by mouth daily.    Yes [provider]  amoxicillin-clavulanate (AUGMENTIN) 875-125 MG tablet Take 1 tablet by mouth 2 (two) times daily for 10 days. Patient not taking: Reported on 01/09/2019 12/31/18 01/10/19  Caren Griffins, MD  chlordiazePOXIDE (LIBRIUM) 5 MG capsule Take 1 capsule (5 mg total) by mouth 3 (three) times daily as needed for anxiety. Patient not taking: Reported on 01/09/2019 12/31/18   Caren Griffins, MD    Family History Family History  Problem Relation Age of Onset  . Cancer Mother        BREAST(BONE)  . Cancer Father        PANCREATIC  . Hypertension Maternal Grandmother   . Stroke Maternal Aunt   . Heart attack Neg Hx     Social History Social History   Tobacco Use  . Smoking status: Current Every Pape Smoker    Packs/[place]aga: 1.00    Years:  40.00    Pack years: 40.00    Types: Cigarettes, E-cigarettes  . Smokeless tobacco: Former Systems developer    Quit date: 1985  . Tobacco comment: vapes all [place]  Substance Use Topics  . Alcohol use: Yes    Alcohol/week: 0.0 standard drinks    Comment: 1-2 bottles of red wine a [place]  . Drug use: No     Allergies   Other   Review of Systems Review of Systems  Constitutional: Positive for fever (subjective, intermittent).  Respiratory: Negative for shortness of breath.   Cardiovascular: Negative for chest pain.  Gastrointestinal: Negative for abdominal pain, diarrhea, nausea and vomiting.  Musculoskeletal: Positive for arthralgias.  Neurological: Positive for tremors (occasional, mild). Negative for dizziness, seizures, syncope, weakness and numbness.  All other systems reviewed and are negative.    Physical Exam Updated Vital Signs BP 127/75 (BP Location: Left Arm)   Pulse 98   Temp 98.1 F (36.7 C) (Oral)  Resp 20   SpO2 97%   Physical Exam Vitals signs and nursing note reviewed.  Constitutional:      General: He is not in acute distress.    Appearance: He is well-developed. He is not diaphoretic.  HENT:     Head: Normocephalic and atraumatic.     Mouth/Throat:     Mouth: Mucous membranes are moist.     Pharynx: Oropharynx is clear.  Eyes:     Conjunctiva/sclera: Conjunctivae normal.  Neck:     Musculoskeletal: Neck supple.  Cardiovascular:     Rate and Rhythm: Normal rate and regular rhythm.     Pulses: Normal pulses.          Radial pulses are 2+ on the right side and 2+ on the left side.       Dorsalis pedis pulses are 2+ on the right side and 2+ on the left side.       Posterior tibial pulses are 2+ on the right side and 2+ on the left side.     Heart sounds: Normal heart sounds.     Comments: Tactile temperature in the extremities appropriate and equal bilaterally. Patient was not tachycardic upon my exam. Pulmonary:     Effort: Pulmonary effort is normal. No  respiratory distress.     Breath sounds: Normal breath sounds.  Abdominal:     Palpations: Abdomen is soft.     Tenderness: There is no abdominal tenderness. There is no guarding.  Musculoskeletal:     Right lower leg: No edema.     Left lower leg: No edema.     Comments: Patient has wounds to his left first toe, as shown in the photos.  Swelling, tenderness, and erythema to the toe and some of the foot.    Lymphadenopathy:     Cervical: No cervical adenopathy.  Skin:    General: Skin is warm and dry.     Capillary Refill: Capillary refill takes less than 2 seconds.  Neurological:     Mental Status: He is alert.     Comments: Sensation to light touch grossly intact in bilateral lower extremities. Motor function intact in the lower extremities.  Psychiatric:        Mood and Affect: Mood and affect normal.        Speech: Speech normal.        Behavior: Behavior normal.              ED Treatments / Results  Labs (all labs ordered are listed, but only abnormal results are displayed) Labs Reviewed  CBC WITH DIFFERENTIAL/PLATELET - Abnormal; Notable for the following components:      Result Value   WBC 3.9 (*)    Hemoglobin 11.3 (*)    HCT 34.7 (*)    RDW 15.8 (*)    nRBC 0.5 (*)    Lymphs Abs 0.4 (*)    All other components within normal limits  CULTURE, BLOOD (ROUTINE X 2)  CULTURE, BLOOD (ROUTINE X 2)  LACTIC ACID, PLASMA  BASIC METABOLIC PANEL    EKG None  Radiology Dg Foot Complete Left  Result Date: 01/09/2019 CLINICAL DATA:  Great toe wound for 4 weeks. Pain and infection. EXAM: LEFT FOOT - COMPLETE 3+ VIEW COMPARISON:  Great toe radiographs and forefoot MRI 12/27/2018 FINDINGS: No bony destructive change. Equivocal decreased density involving the lateral aspect of the great toe distal tuft at site of osteomyelitis on prior MRI. No periosteal reaction or frank osseous erosions.  No soft tissue air or radiopaque foreign body. Soft tissue thickening of the  digit. Hammertoe deformity of the second through fifth digits. No findings elsewhere to suggest osteomyelitis. Well corticated density about the dorsal talus may represent sequela of remote prior injury or accessory ossicle. IMPRESSION: Equivocal decreased density involving the lateral aspect of the great toe distal tuft at site of osteomyelitis on prior MRI. No frank bony destructive change. No soft tissue air or radiopaque foreign body. Electronically Signed   By: Keith Rake M.D.   On: 01/09/2019 19:07    Procedures Procedures (including critical care time)  Medications Ordered in ED Medications  sodium chloride 0.9 % bolus 1,000 mL (has no administration in time range)  vancomycin (VANCOCIN) 1,750 mg in sodium chloride 0.9 % 500 mL IVPB (has no administration in time range)  LORazepam (ATIVAN) injection 0-4 mg (has no administration in time range)    Or  LORazepam (ATIVAN) tablet 0-4 mg (has no administration in time range)  LORazepam (ATIVAN) injection 0-4 mg (has no administration in time range)    Or  LORazepam (ATIVAN) tablet 0-4 mg (has no administration in time range)  thiamine (VITAMIN B-1) tablet 100 mg (has no administration in time range)    Or  thiamine (B-1) injection 100 mg (has no administration in time range)  ondansetron (ZOFRAN) injection 4 mg (4 mg Intravenous Given 01/09/19 1912)  morphine 4 MG/ML injection 4 mg (4 mg Intravenous Given 01/09/19 1912)     Initial Impression / Assessment and Plan / ED Course  I have reviewed the triage vital signs and the nursing notes.  Pertinent labs & imaging results that were available during my care of the patient were reviewed by me and considered in my medical decision making (see chart for details).  Clinical Course as of Jan 08 2126  Tue Jan 09, 2019  1825 Spoke with [place] with [facility] answering service.    [SJ]  1914 Spoke with Dr. Lorin Mercy, orthopedist on call for [facility]. Gave me contact number  for Dr. [name] and suggested I speak with him directly.   [SJ]  [id number] Spoke with Dr. [name]. States patient was seen in the office today, noted to be tachycardic and temperature of 100F. Also had tremors. Concerned for DTs and/or worsening infection, despite oral antibiotics. Advises for [place] to admit patient to [facility] and start IV antibiotics (vancomycin is fine). He will assess patient and plan for amputation on Friday.   [SJ]  2000 Mildly increased from recent values.  WBC(!): 3.9 [SJ]  2056 Spoke with Dr. Jonelle Sidle, hospitalist. Agrees to admit the patient.    [SJ]    Clinical Course User Index [SJ] Nazariah Cadet C, PA-C       Patient presents with known osteomyelitis of the left big toe.  There is a concern for lack of improvement and possible worsening despite oral antibiotics.  Thankfully, patient is nontoxic appearing, afebrile, not hypotensive, not tachypneic, and in no apparent distress.  Recommended for IV antibiotics and admission to [facility].  CIWA also initiated due to patient's alcohol consumption.  Findings and plan of care discussed with Daleen Bo, MD.    Vitals:   01/09/19 1633 01/09/19 1649 01/09/19 1920 01/09/19 2056  BP:  127/75 114/86 (!) 116/96  Pulse:  98 (!) 132 (!) 128  Resp:  20 20 12   Temp:  98.1 F (36.7 C)    TempSrc:  Oral    SpO2: 98% 97% 98% 97%  Final Clinical Impressions(s) / ED Diagnoses   Final diagnoses:  Osteomyelitis of left foot, unspecified type [facility])    ED Discharge Orders    None       Layla Maw 01/09/19 2128    Lorayne Bender, PA-C 01/09/19 2129    Daleen Bo, MD 01/10/19 1109

## 2019-01-09 NOTE — H&P (Signed)
History and Physical   Leonard Bentley SWH:[id number] DOB: [birth year] DOA: 01/09/2019  Referring MD/NP/PA: Dr. Eulis Foster  PCP: [name], MD   Outpatient Specialists: Dr. [name]  Patient coming from: Home  Chief Complaint: Left foot pain  HPI: Leonard Bentley is a 58 y.o. male with medical history significant of recurrent pulmonary embolism, hypertension, alcoholism, tobacco abuse, peripheral vascular disease, non-Hodgkin's lymphoma who was sent over to the ER by Dr. [name] of orthopedics due to osteomyelitis of the left big toe.  He was being followed for nonhealing wound in the area.  He was on trial of of antibiotics treatment which has not decreased his risk.  He was last on Augmentin.  He has been quite having continued pain pain is 8 out of 10 with medications not helping.  He was having some mild tachycardia also when he visited the orthopedics office so he was sent over to the ER for evaluation.  Patient noted to have osteomyelitis with open wound.  He has been admitted to [facility] for schedule amputation of the left big toe this Friday..  ED Course: Temperature 98.1 blood pressure is 129/103 pulse 132 respirate 20 oxygen sat 97% on room air.  White count 3.9 hemoglobin 11.3 and platelets 251.  Chemistry otherwise is within normal lactic acid 1.5.  PT 19 point INR 1.63.  X-ray of the left foot showed acute focal decreased density involving the lateral aspect of the great toe consistent with osteomyelitis previously seen on MRI.  Review of Systems: As per HPI otherwise 10 point review of systems negative.    Past Medical History:  Diagnosis Date  . Acute pulmonary embolism ([place]) 04/11/2016  . Anxiety   . Arthritis   . Atrial tachycardia ([place]) 01/06/2016  . Depression   . DTs (delirium tremens) ([place])   . Dyspnea   . ED (erectile dysfunction)   . ETOH abuse   . Hypertension   . Lymphoma, non Hodgkin's 09/25/2011   Stage 2  . Mitral regurgitation 01/20/2016  . Mobitz I 10/09/2015  . Murmur  01/06/2016  . Occasional tremors   . PAC (premature atrial contraction) 10/09/2015  . S/P minimally-invasive mitral valve repair 10/27/2016   Complex valvuloplasty including artificial Gore-tex neochord placement x6 and 34 mm Edwards Physio II ring annuloplasty via right mini thoracotomy approach    Past Surgical History:  Procedure Laterality Date  . CARDIAC CATHETERIZATION N/A 08/13/2016   Procedure: Right/Left Heart Cath and Coronary Angiography;  Surgeon: [name], MD;  Location: [place] [facility];  Service: Cardiovascular;  Laterality: N/A;  . MITRAL VALVE REPAIR Right 10/27/2016   Procedure: MINIMALLY INVASIVE MITRAL VALVE REPAIR (MVR) USING 34 PHYSIO II ANNULOPLASTY RING;  Surgeon: [name], MD;  Location: [facility];  Service: Open Heart Surgery;  Laterality: Right;  . SKIN BIOPSY Right 04/04/2018   right mid medial anterior tibial shave  see report in chart  . TEE WITHOUT CARDIOVERSION N/A 02/11/2016   Procedure: TRANSESOPHAGEAL ECHOCARDIOGRAM (TEE);  Surgeon: [name], MD;  Location: [place];  Service: Cardiovascular;  Laterality: N/A;  . TEE WITHOUT CARDIOVERSION N/A 10/27/2016   Procedure: TRANSESOPHAGEAL ECHOCARDIOGRAM (TEE);  Surgeon: [name], MD;  Location: [facility];  Service: Open Heart Surgery;  Laterality: N/A;  . TRIGGER FINGER RELEASE Bilateral      reports that he has been smoking cigarettes and e-cigarettes. He has a 40.00 pack-year smoking history. He quit smokeless tobacco use about 35 years ago. He reports current  alcohol use. He reports that he does not use drugs.  Allergies  Allergen Reactions  . Other Other (See Comments)    "Cactus" blisters on tongue    Family History  Problem Relation Age of Onset  . Cancer Mother        BREAST(BONE)  . Cancer Father        PANCREATIC  . Hypertension Maternal Grandmother   . Stroke Maternal Aunt   . Heart attack Neg Hx      Prior to Admission medications   Medication Sig Start Date  End Date Taking? Authorizing Provider  acetaminophen (TYLENOL) 650 MG CR tablet Take 650 mg by mouth every 8 (eight) hours as needed for pain.   Yes [provider]  gabapentin (NEURONTIN) 100 MG capsule Take 1 capsule (100 mg total) by mouth 3 (three) times daily. Patient taking differently: Take 100 mg by mouth 2 (two) times daily.  12/31/18 12/31/19 Yes Gherghe, Vella Redhead, MD  warfarin (COUMADIN) 5 MG tablet Take 5 mg by mouth daily.    Yes [provider]  amoxicillin-clavulanate (AUGMENTIN) 875-125 MG tablet Take 1 tablet by mouth 2 (two) times daily for 10 days. Patient not taking: Reported on 01/09/2019 12/31/18 01/10/19  Caren Griffins, MD  chlordiazePOXIDE (LIBRIUM) 5 MG capsule Take 1 capsule (5 mg total) by mouth 3 (three) times daily as needed for anxiety. Patient not taking: Reported on 01/09/2019 12/31/18   Caren Griffins, MD    Physical Exam: Vitals:   01/09/19 1920 01/09/19 2056 01/09/19 2123 01/09/19 2210  BP: 114/86 (!) 116/96 (!) 117/104 (!) 129/103  Pulse: (!) 132 (!) 128 (!) 124 88  Resp: 20 12  13   Temp:      TempSrc:      SpO2: 98% 97%  98%      Constitutional: NAD, calm, comfortable, tremors Vitals:   01/09/19 1920 01/09/19 2056 01/09/19 2123 01/09/19 2210  BP: 114/86 (!) 116/96 (!) 117/104 (!) 129/103  Pulse: (!) 132 (!) 128 (!) 124 88  Resp: 20 12  13   Temp:      TempSrc:      SpO2: 98% 97%  98%   Eyes: PERRL, lids and conjunctivae normal ENMT: Mucous membranes are moist. Posterior pharynx clear of any exudate or lesions.Normal dentition.  Neck: normal, supple, no masses, no thyromegaly Respiratory: clear to auscultation bilaterally, no wheezing, no crackles. Normal respiratory effort. No accessory muscle use.  Cardiovascular: Regular rate and rhythm, no murmurs / rubs / gallops. No extremity edema. 2+ pedal pulses. No carotid bruits.  Abdomen: no tenderness, no masses palpated. No hepatosplenomegaly. Bowel sounds positive.    Musculoskeletal: no clubbing / cyanosis. No joint deformity upper and lower extremities. Good ROM, no contractures. Normal muscle tone.  Skin: Large left big toe ulcer with draining wound. No induration Neurologic: CN 2-12 grossly intact. Sensation intact, DTR normal. Strength 5/5 in all 4.  Psychiatric: Normal judgment and insight. Alert and oriented x 3. Normal mood.     Labs on Admission: I have personally reviewed following labs and imaging studies  CBC: Recent Labs  Lab 01/09/19 1807  WBC 3.9*  NEUTROABS 2.5  HGB 11.3*  HCT 34.7*  MCV 81.3  PLT 497   Basic Metabolic Panel: Recent Labs  Lab 01/09/19 1807  NA 138  K 4.3  CL 103  CO2 26  GLUCOSE 98  BUN 14  CREATININE 0.85  CALCIUM 9.3   GFR: Estimated Creatinine Clearance: 93.6 mL/min (by C-G formula  based on SCr of 0.85 mg/dL). Liver Function Tests: No results for input(s): AST, ALT, ALKPHOS, BILITOT, PROT, ALBUMIN in the last 168 hours. No results for input(s): LIPASE, AMYLASE in the last 168 hours. No results for input(s): AMMONIA in the last 168 hours. Coagulation Profile: No results for input(s): INR, PROTIME in the last 168 hours. Cardiac Enzymes: No results for input(s): CKTOTAL, CKMB, CKMBINDEX, TROPONINI in the last 168 hours. BNP (last 3 results) No results for input(s): PROBNP in the last 8760 hours. HbA1C: No results for input(s): HGBA1C in the last 72 hours. CBG: No results for input(s): GLUCAP in the last 168 hours. Lipid Profile: No results for input(s): CHOL, HDL, LDLCALC, TRIG, CHOLHDL, LDLDIRECT in the last 72 hours. Thyroid Function Tests: No results for input(s): TSH, T4TOTAL, FREET4, T3FREE, THYROIDAB in the last 72 hours. Anemia Panel: No results for input(s): VITAMINB12, FOLATE, FERRITIN, TIBC, IRON, RETICCTPCT in the last 72 hours. Urine analysis:    Component Value Date/Time   COLORURINE RED (A) 11/01/2018 2149   APPEARANCEUR TURBID (A) 11/01/2018 2149   LABSPEC 1.018  11/01/2018 2149   PHURINE 7.0 11/01/2018 2149   GLUCOSEU NEGATIVE 11/01/2018 2149   HGBUR MODERATE (A) 11/01/2018 2149   BILIRUBINUR NEGATIVE 11/01/2018 2149   [place] NEGATIVE 11/01/2018 2149   PROTEINUR 100 (A) 11/01/2018 2149   UROBILINOGEN 1.0 01/25/2013 0825   NITRITE NEGATIVE 11/01/2018 2149   LEUKOCYTESUR NEGATIVE 11/01/2018 2149   Sepsis Labs: @LABRCNTIP (procalcitonin:4,lacticidven:4) )No results found for this or any previous visit (from the past 240 hour(s)).   Radiological Exams on Admission: Dg Foot Complete Left  Result Date: 01/09/2019 CLINICAL DATA:  Great toe wound for 4 weeks. Pain and infection. EXAM: LEFT FOOT - COMPLETE 3+ VIEW COMPARISON:  Great toe radiographs and forefoot MRI 12/27/2018 FINDINGS: No bony destructive change. Equivocal decreased density involving the lateral aspect of the great toe distal tuft at site of osteomyelitis on prior MRI. No periosteal reaction or frank osseous erosions. No soft tissue air or radiopaque foreign body. Soft tissue thickening of the digit. Hammertoe deformity of the second through fifth digits. No findings elsewhere to suggest osteomyelitis. Well corticated density about the dorsal talus may represent sequela of remote prior injury or accessory ossicle. IMPRESSION: Equivocal decreased density involving the lateral aspect of the great toe distal tuft at site of osteomyelitis on prior MRI. No frank bony destructive change. No soft tissue air or radiopaque foreign body. Electronically Signed   By: Keith Rake M.D.   On: 01/09/2019 19:07    Assessment/Plan Principal Problem:   Osteomyelitis of great toe of left foot (HCC) Active Problems:   History of non-Hodgkin's lymphoma   Alcohol abuse   Cigarette smoker   SVT (supraventricular tachycardia) (HCC)   Recurrent pulmonary emboli (HCC)   HTN (hypertension)     #1 osteomyelitis of the left big toe: Patient has failed outpatient antibiotic treatment.  He will be admitted  to the hospital.  Initiated on IV vancomycin and cefepime.  Blood cultures have been obtained.  Plan surgery on Friday.  #2 history of pulmonary embolism: Currently on Coumadin and subtherapeutic.  With surgery coming on we will discontinue warfarin.  Initiate IV heparin which can be turned off [place] of surgery.  #3 alcohol abuse: Patient denied taking alcohol today.  He has had recent DTs.  We will initiate CIWA protocol empirically.  In the meantime continue monitoring.  #4 tobacco abuse: Nicotine patch will be added with tobacco cessation counseling.  #5 hypertension: Continue  blood pressure control.  #6 supraventricular tachycardia: Most likely secondary to alcohol withdrawals.  Patient is currently stable.  He has chronic SVT.   DVT prophylaxis: Heparin drip Code Status: Full code Family Communication: No family at bedside Disposition Plan: To be determined Consults called: Dr. [name] of orthopedics Admission status: Inpatient  Severity of Illness: The appropriate patient status for this patient is INPATIENT. Inpatient status is judged to be reasonable and necessary in order to provide the required intensity of service to ensure the patient's safety. The patient's presenting symptoms, physical exam findings, and initial radiographic and laboratory data in the context of their chronic comorbidities is felt to place them at high risk for further clinical deterioration. Furthermore, it is not anticipated that the patient will be medically stable for discharge from the hospital within 2 midnights of admission. The following factors support the patient status of inpatient.   " The patient's presenting symptoms include left foot infection. " The worrisome physical exam findings include draining and the left big toe. " The initial radiographic and laboratory data are worrisome because of x-ray confirming osteomyelitis. " The chronic co-morbidities include cellulitis chronically with pulmonary  embolism.   * I certify that at the point of admission it is my clinical judgment that the patient will require inpatient hospital care spanning beyond 2 midnights from the point of admission due to high intensity of service, high risk for further deterioration and high frequency of surveillance required.Barbette Merino MD [facility] Pager [phone]  If 7PM-7AM, please contact night-coverage [email or web address] Password [email or web address]  01/09/2019, 10:12 PM

## 2019-01-09 NOTE — Progress Notes (Addendum)
Pharmacy Antibiotic and Anticoagulation Note  Leonard Bentley is a 58 y.o. male with left foot pain admitted on 01/09/2019 with osteomyelitis.  Pharmacy has been consulted for cefepime and vancomycin and IV heparin. (on PTA warfarin for PE)  Plan: Baseline PT/INR and aptt STAT Cefepime 2 Gm IV q8h Vancomycin 1750 mg x1 then 1250 mg IV q12h for est AUC = 498  Goal AUC = 400-550 F/u scr/cultures/levels/HL/CBC Heparin pending baseline labs     Temp (24hrs), Avg:98.1 F (36.7 C), Min:98.1 F (36.7 C), Max:98.1 F (36.7 C)  Recent Labs  Lab 01/09/19 1807 01/09/19 1827  WBC 3.9*  --   CREATININE 0.85  --   LATICACIDVEN  --  1.4    Estimated Creatinine Clearance: 93.6 mL/min (by C-G formula based on SCr of 0.85 mg/dL).    Allergies  Allergen Reactions  . Other Other (See Comments)    "Cactus" blisters on tongue    Antimicrobials this admission: 3/3 cefepime >>  3/3 vancomycin >>   Dose adjustments this admission:   Microbiology results:  BCx:   UCx:    Sputum:    MRSA PCR:   Thank you for allowing pharmacy to be a part of this patient's care.  Dorrene German 01/09/2019 10:27 PM

## 2019-01-09 NOTE — ED Notes (Signed)
ED PA at bedside

## 2019-01-10 ENCOUNTER — Encounter (INDEPENDENT_AMBULATORY_CARE_PROVIDER_SITE_OTHER): Payer: Self-pay | Admitting: Orthopedic Surgery

## 2019-01-10 DIAGNOSIS — I2699 Other pulmonary embolism without acute cor pulmonale: Secondary | ICD-10-CM

## 2019-01-10 DIAGNOSIS — F17200 Nicotine dependence, unspecified, uncomplicated: Secondary | ICD-10-CM

## 2019-01-10 DIAGNOSIS — M869 Osteomyelitis, unspecified: Secondary | ICD-10-CM

## 2019-01-10 DIAGNOSIS — I471 Supraventricular tachycardia: Secondary | ICD-10-CM

## 2019-01-10 DIAGNOSIS — I10 Essential (primary) hypertension: Secondary | ICD-10-CM

## 2019-01-10 LAB — CBC
HCT: 32.2 % — ABNORMAL LOW (ref 39.0–52.0)
Hemoglobin: 10.4 g/dL — ABNORMAL LOW (ref 13.0–17.0)
MCH: 25.7 pg — ABNORMAL LOW (ref 26.0–34.0)
MCHC: 32.3 g/dL (ref 30.0–36.0)
MCV: 79.5 fL — ABNORMAL LOW (ref 80.0–100.0)
Platelets: 217 10*3/uL (ref 150–400)
RBC: 4.05 MIL/uL — ABNORMAL LOW (ref 4.22–5.81)
RDW: 15.3 % (ref 11.5–15.5)
WBC: 3 10*3/uL — ABNORMAL LOW (ref 4.0–10.5)
nRBC: 0 % (ref 0.0–0.2)

## 2019-01-10 LAB — COMPREHENSIVE METABOLIC PANEL
ALT: 25 U/L (ref 0–44)
AST: 38 U/L (ref 15–41)
Albumin: 3.2 g/dL — ABNORMAL LOW (ref 3.5–5.0)
Alkaline Phosphatase: 41 U/L (ref 38–126)
Anion gap: 7 (ref 5–15)
BUN: 12 mg/dL (ref 6–20)
CO2: 25 mmol/L (ref 22–32)
Calcium: 8.8 mg/dL — ABNORMAL LOW (ref 8.9–10.3)
Chloride: 104 mmol/L (ref 98–111)
Creatinine, Ser: 0.96 mg/dL (ref 0.61–1.24)
GFR calc Af Amer: >60 mL/min (ref >60)
GFR calc non Af Amer: >60 mL/min (ref >60)
Glucose, Bld: 103 mg/dL — ABNORMAL HIGH (ref 70–99)
Potassium: 4.2 mmol/L (ref 3.5–5.1)
Sodium: 136 mmol/L (ref 135–145)
Total Bilirubin: 1.1 mg/dL (ref 0.3–1.2)
Total Protein: 5.9 g/dL — ABNORMAL LOW (ref 6.5–8.1)

## 2019-01-10 LAB — HEPARIN LEVEL (UNFRACTIONATED)
Heparin Unfractionated: 0.23 IU/mL — ABNORMAL LOW (ref 0.30–0.70)
Heparin Unfractionated: 0.57 IU/mL (ref 0.30–0.70)

## 2019-01-10 LAB — PROTIME-INR
INR: 1.5 — ABNORMAL HIGH (ref 0.8–1.2)
Prothrombin Time: 18 seconds — ABNORMAL HIGH (ref 11.4–15.2)

## 2019-01-10 LAB — APTT: aPTT: 31 seconds (ref 24–36)

## 2019-01-10 MED ORDER — OXYCODONE-ACETAMINOPHEN 5-325 MG PO TABS
2.0000 | ORAL_TABLET | ORAL | Status: DC | PRN
  Administered 2019-01-10 – 2019-01-21 (×20): 2 via ORAL
  Filled 2019-01-10 (×20): qty 2

## 2019-01-10 MED ORDER — NICOTINE 21 MG/24HR TD PT24
21.0000 mg | MEDICATED_PATCH | Freq: Every day | TRANSDERMAL | Status: DC
  Administered 2019-01-10 – 2019-01-21 (×13): 21 mg via TRANSDERMAL
  Filled 2019-01-10 (×13): qty 1

## 2019-01-10 MED ORDER — HEPARIN (PORCINE) 25000 UT/250ML-% IV SOLN
1450.0000 [IU]/h | INTRAVENOUS | Status: AC
  Administered 2019-01-10: 1450 [IU]/h via INTRAVENOUS
  Administered 2019-01-10: 1300 [IU]/h via INTRAVENOUS
  Administered 2019-01-11: 1450 [IU]/h via INTRAVENOUS
  Filled 2019-01-10 (×3): qty 250

## 2019-01-10 MED ORDER — OXYCODONE-ACETAMINOPHEN 5-325 MG PO TABS
2.0000 | ORAL_TABLET | Freq: Once | ORAL | Status: AC
  Administered 2019-01-10: 2 via ORAL
  Filled 2019-01-10: qty 2

## 2019-01-10 NOTE — Progress Notes (Signed)
Office Visit Note   Patient: Leonard Bentley           Date of Birth: 1961/10/21           MRN: [id number] Visit Date: 01/09/2019              Requested by: [name], MD [address] [place], [place] [address] PCP: [name], MD  Chief Complaint  Patient presents with  . Right Foot - Pain  . Left Foot - Pain      HPI: Patient is a 58 year old gentleman who presents in follow-up from the hospital.  Patient had a ulcer over the dorsum of the left great toe as well as a necrotic ulcer over the tip of the toe.  Patient was treated with IV antibiotics and patient is seen for initial evaluation to see if there is any improvement with his foot.  Patient complains of fever and chills he is shaking uncontrollably.  Assessment & Plan: Visit Diagnoses:  1. Osteomyelitis of great toe of left foot ([place])     Plan: With patient's current condition I feel the patient does need evaluation in a hospital setting for treatment of his DVT shaking treatment of his temperature and treatment of his tachycardia.  I will follow-up in the office and anticipate needing to schedule patient for a great toe amputation of the left once he is medically stable.  Follow-Up Instructions: Return in about 1 week (around 01/16/2019).   Ortho Exam  Patient is alert, oriented, no adenopathy, well-dressed, normal affect, normal respiratory effort. Patient has an antalgic gait he is shaking all extremities uncontrollably.  His temperature is 100.0.  Patient's blood pressure is 108/79 his pulse is 142.  I cannot palpate a pulse but with a Doppler patient does have a strong triphasic dorsalis pedis pulse.  Patient has been on Keflex 500 mg twice a [place] that he states he finished yesterday.  Patient is status post a mitral valve repair in 2017.  Imaging: Dg Foot Complete Left  Result Date: 01/09/2019 CLINICAL DATA:  Great toe wound for 4 weeks. Pain and infection. EXAM: LEFT FOOT - COMPLETE 3+ VIEW  COMPARISON:  Great toe radiographs and forefoot MRI 12/27/2018 FINDINGS: No bony destructive change. Equivocal decreased density involving the lateral aspect of the great toe distal tuft at site of osteomyelitis on prior MRI. No periosteal reaction or frank osseous erosions. No soft tissue air or radiopaque foreign body. Soft tissue thickening of the digit. Hammertoe deformity of the second through fifth digits. No findings elsewhere to suggest osteomyelitis. Well corticated density about the dorsal talus may represent sequela of remote prior injury or accessory ossicle. IMPRESSION: Equivocal decreased density involving the lateral aspect of the great toe distal tuft at site of osteomyelitis on prior MRI. No frank bony destructive change. No soft tissue air or radiopaque foreign body. Electronically Signed   By: Keith Rake M.D.   On: 01/09/2019 19:07   No images are attached to the encounter.  Labs: Lab Results  Component Value Date   HGBA1C 5.2 10/25/2016   HGBA1C 6.1 (H) 04/03/2016   HGBA1C 5.9 (H) 08/18/2015   LABURIC 5.2 12/09/2018   LABURIC 7.4 08/18/2015   LABURIC 4.1 12/01/2011   REPTSTATUS PENDING 01/09/2019   CULT  01/09/2019    NO GROWTH < 24 HOURS Performed at [facility] [address], [place], [place] [address]      Lab Results  Component Value Date  ALBUMIN 3.2 (L) 01/10/2019   ALBUMIN 3.4 (L) 12/28/2018   ALBUMIN 4.0 12/27/2018   LABURIC 5.2 12/09/2018   LABURIC 7.4 08/18/2015   LABURIC 4.1 12/01/2011    Body mass index is 27.62 kg/m.  Orders:  No orders of the defined types were placed in this encounter.  No orders of the defined types were placed in this encounter.    Procedures: No procedures performed  Clinical Data: No additional findings.  ROS:  All other systems negative, except as noted in the HPI. Review of Systems  Objective: Vital Signs: Ht 5\' 9"  (1.753 m)   Wt 187 lb (84.8 kg)   BMI 27.62 kg/m   Specialty Comments:    No specialty comments available.  PMFS History: Patient Active Problem List   Diagnosis Date Noted  . Osteomyelitis of great toe of left foot ([place]) 01/09/2019  . Foot ulcer, left ([place]) 12/27/2018  . Cellulitis 12/07/2018  . Bleeding from left ear 11/28/2018  . HTN (hypertension) 11/02/2018  . UTI (urinary tract infection) 11/02/2018  . Ecchymosis   . Tachyarrhythmia 08/30/2018  . Hypomagnesemia 08/30/2018  . Recurrent pulmonary emboli ([place])   . Hematuria   . Delirium tremens ([place])   . LFTs abnormal   . Alcoholic hepatitis without ascites   . Alcohol withdrawal ([place]) 06/04/2018  . Dizziness   . Posterior tibial tendon dysfunction 02/21/2018  . Supratherapeutic INR 11/27/2016  . Dehydration 11/27/2016  . SVT (supraventricular tachycardia) ([place])   . S/P minimally-invasive mitral valve repair 10/27/2016  . History of pulmonary embolus (PE) 04/11/2016  . Left sided numbness   . Anxiety 04/02/2016  . Panic attacks 04/02/2016  . Mitral valve prolapse   . Mitral regurgitation 01/20/2016  . Atrial tachycardia ([place]) 01/06/2016  . Murmur 01/06/2016  . Mobitz I 10/09/2015  . PAC (premature atrial contraction) 10/09/2015  . Arthritis 08/14/2015  . Neuropathy due to chemotherapeutic drug ([place]) 03/09/2013  . Leucopenia 01/31/2013  . Personal history of pulmonary embolism 12/28/2012  . Alcohol abuse 08/24/2012  . Cigarette smoker 08/24/2012  . Hemorrhoid 03/08/2012  . Anemia 12/10/2011  . History of non-Hodgkin's lymphoma 09/25/2011   Past Medical History:  Diagnosis Date  . Acute pulmonary embolism ([place]) 04/11/2016  . Anxiety   . Arthritis   . Atrial tachycardia ([place]) 01/06/2016  . Depression   . DTs (delirium tremens) ([place])   . Dyspnea   . ED (erectile dysfunction)   . ETOH abuse   . Hypertension   . Lymphoma, non Hodgkin's 09/25/2011   Stage 2  . Mitral regurgitation 01/20/2016  . Mobitz I 10/09/2015  . Murmur 01/06/2016  . Occasional tremors   . PAC (premature atrial  contraction) 10/09/2015  . S/P minimally-invasive mitral valve repair 10/27/2016   Complex valvuloplasty including artificial Gore-tex neochord placement x6 and 34 mm Edwards Physio II ring annuloplasty via right mini thoracotomy approach    Family History  Problem Relation Age of Onset  . Cancer Mother        BREAST(BONE)  . Cancer Father        PANCREATIC  . Hypertension Maternal Grandmother   . Stroke Maternal Aunt   . Heart attack Neg Hx     Past Surgical History:  Procedure Laterality Date  . CARDIAC CATHETERIZATION N/A 08/13/2016   Procedure: Right/Left Heart Cath and Coronary Angiography;  Surgeon: [name], MD;  Location: [facility];  Service: Cardiovascular;  Laterality: N/A;  . MITRAL VALVE REPAIR Right 10/27/2016  Procedure: MINIMALLY INVASIVE MITRAL VALVE REPAIR (MVR) USING 34 PHYSIO II ANNULOPLASTY RING;  Surgeon: [name], MD;  Location: [facility];  Service: Open Heart Surgery;  Laterality: Right;  . SKIN BIOPSY Right 04/04/2018   right mid medial anterior tibial shave  see report in chart  . TEE WITHOUT CARDIOVERSION N/A 02/11/2016   Procedure: TRANSESOPHAGEAL ECHOCARDIOGRAM (TEE);  Surgeon: [name], MD;  Location: [facility];  Service: Cardiovascular;  Laterality: N/A;  . TEE WITHOUT CARDIOVERSION N/A 10/27/2016   Procedure: TRANSESOPHAGEAL ECHOCARDIOGRAM (TEE);  Surgeon: [name], MD;  Location: [facility];  Service: Open Heart Surgery;  Laterality: N/A;  . TRIGGER FINGER RELEASE Bilateral    Social History   Occupational History  . Occupation: unemployed  Tobacco Use  . Smoking status: Current [place] Smoker    Packs/[place]: 1.00    Years: 40.00    Pack years: 40.00    Types: Cigarettes, E-cigarettes  . Smokeless tobacco: Former Systems developer    Quit date: 1985  . Tobacco comment: vapes all [place]  Substance and Sexual Activity  . Alcohol use: Yes    Alcohol/week: 0.0 standard drinks    Comment: 1-2 bottles of red wine a [place]  . Drug  use: No  . Sexual activity: Not Currently

## 2019-01-10 NOTE — Plan of Care (Signed)
  Problem: Pain Managment: Goal: General experience of comfort will improve Outcome: Progressing   Problem: Safety: Goal: Ability to remain free from injury will improve Outcome: Progressing   

## 2019-01-10 NOTE — H&P (View-Only) (Signed)
ORTHOPAEDIC CONSULTATION  REQUESTING PHYSICIAN: Marcell Anger*  Chief Complaint: Osteomyelitis ulceration left great toe.  HPI: Leonard Bentley is a 58 y.o. male who presents with patient was initially seen in the hospital and was treated with IV antibiotics.  He showed up at my office yesterday with shaking tremors heart rate 145 temperature 100 patient was referred to the emergency room at [facility].  Patient instead called 911 and was taken by EMS to [facility].  Patient presents at [facility] for treatment of the osteomyelitis ulceration left great toe as well as for treatment of his fever chills and delirium tremors.  Past Medical History:  Diagnosis Date  . Acute pulmonary embolism ([place]) 04/11/2016  . Anxiety   . Arthritis   . Atrial tachycardia ([place]) 01/06/2016  . Depression   . DTs (delirium tremens) ([place])   . Dyspnea   . ED (erectile dysfunction)   . ETOH abuse   . Hypertension   . Lymphoma, non Hodgkin's 09/25/2011   Stage 2  . Mitral regurgitation 01/20/2016  . Mobitz I 10/09/2015  . Murmur 01/06/2016  . Occasional tremors   . PAC (premature atrial contraction) 10/09/2015  . S/P minimally-invasive mitral valve repair 10/27/2016   Complex valvuloplasty including artificial Gore-tex neochord placement x6 and 34 mm Edwards Physio II ring annuloplasty via right mini thoracotomy approach   Past Surgical History:  Procedure Laterality Date  . CARDIAC CATHETERIZATION N/A 08/13/2016   Procedure: Right/Left Heart Cath and Coronary Angiography;  Surgeon: [name], MD;  Location: [place] [facility];  Service: Cardiovascular;  Laterality: N/A;  . MITRAL VALVE REPAIR Right 10/27/2016   Procedure: MINIMALLY INVASIVE MITRAL VALVE REPAIR (MVR) USING 34 PHYSIO II ANNULOPLASTY RING;  Surgeon: [name], MD;  Location: [place];  Service: Open Heart Surgery;  Laterality: Right;  . SKIN BIOPSY Right 04/04/2018   right mid medial anterior tibial shave  see report in  chart  . TEE WITHOUT CARDIOVERSION N/A 02/11/2016   Procedure: TRANSESOPHAGEAL ECHOCARDIOGRAM (TEE);  Surgeon: [name], MD;  Location: [place];  Service: Cardiovascular;  Laterality: N/A;  . TEE WITHOUT CARDIOVERSION N/A 10/27/2016   Procedure: TRANSESOPHAGEAL ECHOCARDIOGRAM (TEE);  Surgeon: [name], MD;  Location: [place];  Service: Open Heart Surgery;  Laterality: N/A;  . TRIGGER FINGER RELEASE Bilateral    Social History   Socioeconomic History  . Marital status: Single    Spouse name: Not on file  . Number of children: Not on file  . Years of education: Not on file  . Highest education level: Not on file  Occupational History  . Occupation: unemployed  Social Needs  . Financial resource strain: Not on file  . Food insecurity:    Worry: Not on file    Inability: Not on file  . Transportation needs:    Medical: Not on file    Non-medical: Not on file  Tobacco Use  . Smoking status: Current Every [place]ais Smoker    Packs/[place]: 1.00    Years: 40.00    Pack years: 40.00    Types: Cigarettes, E-cigarettes  . Smokeless tobacco: Former Systems developer    Quit date: 1985  . Tobacco comment: vapes all [place]  Substance and Sexual Activity  . Alcohol use: Yes    Alcohol/week: 0.0 standard drinks    Comment: 1-2 bottles of red wine a [place]  . Drug use: No  . Sexual activity: Not Currently  Lifestyle  . Physical activity:  Days per week: Not on file    Minutes per session: Not on file  . Stress: Not on file  Relationships  . Social connections:    Talks on phone: Not on file    Gets together: Not on file    Attends religious service: Not on file    Active member of club or organization: Not on file    Attends meetings of clubs or organizations: Not on file    Relationship status: Not on file  Other Topics Concern  . Not on file  Social History Narrative   Epworth sleepiness scale as of 10/09/15 a 1   Family History  Problem Relation Age of Onset  . Cancer Mother         BREAST(BONE)  . Cancer Father        PANCREATIC  . Hypertension Maternal Grandmother   . Stroke Maternal Aunt   . Heart attack Neg Hx    - negative except otherwise stated in the family history section Allergies  Allergen Reactions  . Other Other (See Comments)    "Cactus" blisters on tongue   Prior to Admission medications   Medication Sig Start Date End Date Taking? Authorizing Provider  acetaminophen (TYLENOL) 650 MG CR tablet Take 650 mg by mouth every 8 (eight) hours as needed for pain.   Yes [provider]  gabapentin (NEURONTIN) 100 MG capsule Take 1 capsule (100 mg total) by mouth 3 (three) times daily. Patient taking differently: Take 100 mg by mouth 2 (two) times daily.  12/31/18 12/31/19 Yes Gherghe, Vella Redhead, MD  warfarin (COUMADIN) 5 MG tablet Take 5 mg by mouth daily.    Yes [provider]  amoxicillin-clavulanate (AUGMENTIN) 875-125 MG tablet Take 1 tablet by mouth 2 (two) times daily for 10 days. Patient not taking: Reported on 01/09/2019 12/31/18 01/10/19  Caren Griffins, MD  chlordiazePOXIDE (LIBRIUM) 5 MG capsule Take 1 capsule (5 mg total) by mouth 3 (three) times daily as needed for anxiety. Patient not taking: Reported on 01/09/2019 12/31/18   Caren Griffins, MD   Dg Foot Complete Left  Result Date: 01/09/2019 CLINICAL DATA:  Great toe wound for 4 weeks. Pain and infection. EXAM: LEFT FOOT - COMPLETE 3+ VIEW COMPARISON:  Great toe radiographs and forefoot MRI 12/27/2018 FINDINGS: No bony destructive change. Equivocal decreased density involving the lateral aspect of the great toe distal tuft at site of osteomyelitis on prior MRI. No periosteal reaction or frank osseous erosions. No soft tissue air or radiopaque foreign body. Soft tissue thickening of the digit. Hammertoe deformity of the second through fifth digits. No findings elsewhere to suggest osteomyelitis. Well corticated density about the dorsal talus may represent sequela of remote prior injury  or accessory ossicle. IMPRESSION: Equivocal decreased density involving the lateral aspect of the great toe distal tuft at site of osteomyelitis on prior MRI. No frank bony destructive change. No soft tissue air or radiopaque foreign body. Electronically Signed   By: Keith Rake M.D.   On: 01/09/2019 19:07   - pertinent xrays, CT, MRI studies were reviewed and independently interpreted  Positive ROS: All other systems have been reviewed and were otherwise negative with the exception of those mentioned in the HPI and as above.  Physical Exam: General: Alert, no acute distress Psychiatric: Patient is competent for consent with normal mood and affect Lymphatic: No axillary or cervical lymphadenopathy Cardiovascular: No pedal edema Respiratory: No cyanosis, no use of accessory musculature GI: No organomegaly, abdomen  is soft and non-tender    Images:  @ENCIMAGES @  Labs:  Lab Results  Component Value Date   HGBA1C 5.2 10/25/2016   HGBA1C 6.1 (H) 04/03/2016   HGBA1C 5.9 (H) 08/18/2015   LABURIC 5.2 12/09/2018   LABURIC 7.4 08/18/2015   LABURIC 4.1 12/01/2011   REPTSTATUS PENDING 01/09/2019   CULT  01/09/2019    NO GROWTH < 24 HOURS Performed at [facility], [place] [address], [place], [place] [address]     Lab Results  Component Value Date   ALBUMIN 3.2 (L) 01/10/2019   ALBUMIN 3.4 (L) 12/28/2018   ALBUMIN 4.0 12/27/2018   LABURIC 5.2 12/09/2018   LABURIC 7.4 08/18/2015   LABURIC 4.1 12/01/2011    Neurologic: Patient does not have protective sensation bilateral lower extremities.   MUSCULOSKELETAL:   Skin: Examination patient has necrotic ulcers of the left great toe dorsally and distally.  Patient has a good dorsalis pedis pulse.  Radiographs and MRI scan shows osteomyelitis of the tuft of the left great toe.    Assessment: Assessment: Osteomyelitis ulceration left great toe.  Plan: Plan: Discussed with the patient we will plan for amputation of the  left great toe on Friday.  Risk and benefits were discussed including risk of the wound not healing the importance of nonweightbearing.  Thank you for the consult and the opportunity to see Mr. Carmichael, MD [facility] [phone] 2:49 PM

## 2019-01-10 NOTE — Consult Note (Signed)
ORTHOPAEDIC CONSULTATION  REQUESTING PHYSICIAN: Marcell Anger*  Chief Complaint: Osteomyelitis ulceration left great toe.  HPI: Leonard Bentley is a 59 y.o. male who presents with patient was initially seen in the hospital and was treated with IV antibiotics.  He showed up at my office yesterday with shaking tremors heart rate 145 temperature 100 patient was referred to the emergency room at [facility].  Patient instead called 911 and was taken by EMS to [facility].  Patient presents at [facility] for treatment of the osteomyelitis ulceration left great toe as well as for treatment of his fever chills and delirium tremors.  Past Medical History:  Diagnosis Date  . Acute pulmonary embolism ([place]) 04/11/2016  . Anxiety   . Arthritis   . Atrial tachycardia ([place]) 01/06/2016  . Depression   . DTs (delirium tremens) ([place])   . Dyspnea   . ED (erectile dysfunction)   . ETOH abuse   . Hypertension   . Lymphoma, non Hodgkin's 09/25/2011   Stage 2  . Mitral regurgitation 01/20/2016  . Mobitz I 10/09/2015  . Murmur 01/06/2016  . Occasional tremors   . PAC (premature atrial contraction) 10/09/2015  . S/P minimally-invasive mitral valve repair 10/27/2016   Complex valvuloplasty including artificial Gore-tex neochord placement x6 and 34 mm Edwards Physio II ring annuloplasty via right mini thoracotomy approach   Past Surgical History:  Procedure Laterality Date  . CARDIAC CATHETERIZATION N/A 08/13/2016   Procedure: Right/Left Heart Cath and Coronary Angiography;  Surgeon: [name], MD;  Location: [place] [facility];  Service: Cardiovascular;  Laterality: N/A;  . MITRAL VALVE REPAIR Right 10/27/2016   Procedure: MINIMALLY INVASIVE MITRAL VALVE REPAIR (MVR) USING 34 PHYSIO II ANNULOPLASTY RING;  Surgeon: [name], MD;  Location: [place];  Service: Open Heart Surgery;  Laterality: Right;  . SKIN BIOPSY Right 04/04/2018   right mid medial anterior tibial shave  see report in  chart  . TEE WITHOUT CARDIOVERSION N/A 02/11/2016   Procedure: TRANSESOPHAGEAL ECHOCARDIOGRAM (TEE);  Surgeon: [name], MD;  Location: [place];  Service: Cardiovascular;  Laterality: N/A;  . TEE WITHOUT CARDIOVERSION N/A 10/27/2016   Procedure: TRANSESOPHAGEAL ECHOCARDIOGRAM (TEE);  Surgeon: [name], MD;  Location: [place];  Service: Open Heart Surgery;  Laterality: N/A;  . TRIGGER FINGER RELEASE Bilateral    Social History   Socioeconomic History  . Marital status: Single    Spouse name: Not on file  . Number of children: Not on file  . Years of education: Not on file  . Highest education level: Not on file  Occupational History  . Occupation: unemployed  Social Needs  . Financial resource strain: Not on file  . Food insecurity:    Worry: Not on file    Inability: Not on file  . Transportation needs:    Medical: Not on file    Non-medical: Not on file  Tobacco Use  . Smoking status: Current Every [place] Smoker    Packs/[place]: 1.00    Years: 40.00    Pack years: 40.00    Types: Cigarettes, E-cigarettes  . Smokeless tobacco: Former Systems developer    Quit date: 1985  . Tobacco comment: vapes all [place]  Substance and Sexual Activity  . Alcohol use: Yes    Alcohol/week: 0.0 standard drinks    Comment: 1-2 bottles of red wine a [place]  . Drug use: No  . Sexual activity: Not Currently  Lifestyle  . Physical activity:  Days per week: Not on file    Minutes per session: Not on file  . Stress: Not on file  Relationships  . Social connections:    Talks on phone: Not on file    Gets together: Not on file    Attends religious service: Not on file    Active member of club or organization: Not on file    Attends meetings of clubs or organizations: Not on file    Relationship status: Not on file  Other Topics Concern  . Not on file  Social History Narrative   Epworth sleepiness scale as of 10/09/15 a 1   Family History  Problem Relation Age of Onset  . Cancer Mother         BREAST(BONE)  . Cancer Father        PANCREATIC  . Hypertension Maternal Grandmother   . Stroke Maternal Aunt   . Heart attack Neg Hx    - negative except otherwise stated in the family history section Allergies  Allergen Reactions  . Other Other (See Comments)    "Cactus" blisters on tongue   Prior to Admission medications   Medication Sig Start Date End Date Taking? Authorizing Provider  acetaminophen (TYLENOL) 650 MG CR tablet Take 650 mg by mouth every 8 (eight) hours as needed for pain.   Yes [provider]  gabapentin (NEURONTIN) 100 MG capsule Take 1 capsule (100 mg total) by mouth 3 (three) times daily. Patient taking differently: Take 100 mg by mouth 2 (two) times daily.  12/31/18 12/31/19 Yes Gherghe, Vella Redhead, MD  warfarin (COUMADIN) 5 MG tablet Take 5 mg by mouth daily.    Yes [provider]  amoxicillin-clavulanate (AUGMENTIN) 875-125 MG tablet Take 1 tablet by mouth 2 (two) times daily for 10 days. Patient not taking: Reported on 01/09/2019 12/31/18 01/10/19  Caren Griffins, MD  chlordiazePOXIDE (LIBRIUM) 5 MG capsule Take 1 capsule (5 mg total) by mouth 3 (three) times daily as needed for anxiety. Patient not taking: Reported on 01/09/2019 12/31/18   Caren Griffins, MD   Dg Foot Complete Left  Result Date: 01/09/2019 CLINICAL DATA:  Great toe wound for 4 weeks. Pain and infection. EXAM: LEFT FOOT - COMPLETE 3+ VIEW COMPARISON:  Great toe radiographs and forefoot MRI 12/27/2018 FINDINGS: No bony destructive change. Equivocal decreased density involving the lateral aspect of the great toe distal tuft at site of osteomyelitis on prior MRI. No periosteal reaction or frank osseous erosions. No soft tissue air or radiopaque foreign body. Soft tissue thickening of the digit. Hammertoe deformity of the second through fifth digits. No findings elsewhere to suggest osteomyelitis. Well corticated density about the dorsal talus may represent sequela of remote prior injury  or accessory ossicle. IMPRESSION: Equivocal decreased density involving the lateral aspect of the great toe distal tuft at site of osteomyelitis on prior MRI. No frank bony destructive change. No soft tissue air or radiopaque foreign body. Electronically Signed   By: Keith Rake M.D.   On: 01/09/2019 19:07   - pertinent xrays, CT, MRI studies were reviewed and independently interpreted  Positive ROS: All other systems have been reviewed and were otherwise negative with the exception of those mentioned in the HPI and as above.  Physical Exam: General: Alert, no acute distress Psychiatric: Patient is competent for consent with normal mood and affect Lymphatic: No axillary or cervical lymphadenopathy Cardiovascular: No pedal edema Respiratory: No cyanosis, no use of accessory musculature GI: No organomegaly, abdomen  is soft and non-tender    Images:  @ENCIMAGES @  Labs:  Lab Results  Component Value Date   HGBA1C 5.2 10/25/2016   HGBA1C 6.1 (H) 04/03/2016   HGBA1C 5.9 (H) 08/18/2015   LABURIC 5.2 12/09/2018   LABURIC 7.4 08/18/2015   LABURIC 4.1 12/01/2011   REPTSTATUS PENDING 01/09/2019   CULT  01/09/2019    NO GROWTH < 24 HOURS Performed at [place] [address], [place], [place] [address]     Lab Results  Component Value Date   ALBUMIN 3.2 (L) 01/10/2019   ALBUMIN 3.4 (L) 12/28/2018   ALBUMIN 4.0 12/27/2018   LABURIC 5.2 12/09/2018   LABURIC 7.4 08/18/2015   LABURIC 4.1 12/01/2011    Neurologic: Patient does not have protective sensation bilateral lower extremities.   MUSCULOSKELETAL:   Skin: Examination patient has necrotic ulcers of the left great toe dorsally and distally.  Patient has a good dorsalis pedis pulse.  Radiographs and MRI scan shows osteomyelitis of the tuft of the left great toe.    Assessment: Assessment: Osteomyelitis ulceration left great toe.  Plan: Plan: Discussed with the patient we will plan for amputation of the  left great toe on Friday.  Risk and benefits were discussed including risk of the wound not healing the importance of nonweightbearing.  Thank you for the consult and the opportunity to see Mr. [place], MD [facility] [phone] 2:49 PM

## 2019-01-10 NOTE — Progress Notes (Signed)
ANTICOAGULATION CONSULT NOTE - Initial Consult  Pharmacy Consult for heparin Indication: pulmonary embolus  Allergies  Allergen Reactions  . Other Other (See Comments)    "Cactus" blisters on tongue    Patient Measurements:   Heparin Dosing Weight: 84.8 kg  Vital Signs: Temp: 98.6 F (37 C) (03/03 2321) Temp Source: Oral (03/03 2321) BP: 128/96 (03/03 2321) Pulse Rate: 120 (03/03 2321)  Labs: Recent Labs    01/09/19 1807 01/10/19 0123  HGB 11.3* 10.4*  HCT 34.7* 32.2*  PLT 251 217  APTT  --  31  LABPROT  --  18.0*  INR  --  1.5*  CREATININE 0.85 0.96    Estimated Creatinine Clearance: 82.9 mL/min (by C-G formula based on SCr of 0.96 mg/dL).   Medical History: Past Medical History:  Diagnosis Date  . Acute pulmonary embolism ([place]) 04/11/2016  . Anxiety   . Arthritis   . Atrial tachycardia ([place]) 01/06/2016  . Depression   . DTs (delirium tremens) ([place])   . Dyspnea   . ED (erectile dysfunction)   . ETOH abuse   . Hypertension   . Lymphoma, non Hodgkin's 09/25/2011   Stage 2  . Mitral regurgitation 01/20/2016  . Mobitz I 10/09/2015  . Murmur 01/06/2016  . Occasional tremors   . PAC (premature atrial contraction) 10/09/2015  . S/P minimally-invasive mitral valve repair 10/27/2016   Complex valvuloplasty including artificial Gore-tex neochord placement x6 and 34 mm Edwards Physio II ring annuloplasty via right mini thoracotomy approach    Medications:  Medications Prior to Admission  Medication Sig Dispense Refill Last Dose  . acetaminophen (TYLENOL) 650 MG CR tablet Take 650 mg by mouth every 8 (eight) hours as needed for pain.   01/08/2019 at Unknown time  . gabapentin (NEURONTIN) 100 MG capsule Take 1 capsule (100 mg total) by mouth 3 (three) times daily. (Patient taking differently: Take 100 mg by mouth 2 (two) times daily. ) 90 capsule 2 01/08/2019 at Unknown time  . warfarin (COUMADIN) 5 MG tablet Take 5 mg by mouth daily.    01/08/2019 at 2130  .  amoxicillin-clavulanate (AUGMENTIN) 875-125 MG tablet Take 1 tablet by mouth 2 (two) times daily for 10 days. (Patient not taking: Reported on 01/09/2019) 20 tablet 0 Not Taking at Unknown time  . chlordiazePOXIDE (LIBRIUM) 5 MG capsule Take 1 capsule (5 mg total) by mouth 3 (three) times daily as needed for anxiety. (Patient not taking: Reported on 01/09/2019) 12 capsule 0 Not Taking at Unknown time    Assessment: 58 yo man to start heparin for h/o PE.  INR 1.5 on admission Goal of Therapy:  Heparin level 0.3-0.7 units/ml Monitor platelets by anticoagulation protocol: Yes   Plan:  Heparin drip at 1350 units/hr Check heparin level ~ 6 hours after start and daily while on heparin Monitor for bleeding complications  Thanks for allowing pharmacy to be a part of this patient's care.  Excell Seltzer, PharmD Clinical Pharmacist  01/10/2019,1:56 AM

## 2019-01-10 NOTE — Progress Notes (Signed)
[place] for heparin Indication: pulmonary embolus  Allergies  Allergen Reactions  . Other Other (See Comments)    "Cactus" blisters on tongue    Patient Measurements: Wt: 84.8 kg Ht: 5'9" Heparin Dosing Weight: 84.8 kg  Vital Signs: Temp: 98.5 F (36.9 C) (03/04 0527) Temp Source: Oral (03/04 0527) BP: 122/86 (03/04 0527) Pulse Rate: 115 (03/04 0527)  Labs: Recent Labs    01/09/19 1807 01/10/19 0123 01/10/19 0823 01/10/19 1549  HGB 11.3* 10.4*  --   --   HCT 34.7* 32.2*  --   --   PLT 251 217  --   --   APTT  --  31  --   --   LABPROT  --  18.0*  --   --   INR  --  1.5*  --   --   HEPARINUNFRC  --   --  0.23* 0.57  CREATININE 0.85 0.96  --   --     Estimated Creatinine Clearance: 82.9 mL/min (by C-G formula based on SCr of 0.96 mg/dL).   Medical History: Past Medical History:  Diagnosis Date  . Acute pulmonary embolism ([place]) 04/11/2016  . Anxiety   . Arthritis   . Atrial tachycardia ([place]) 01/06/2016  . Depression   . DTs (delirium tremens) ([place])   . Dyspnea   . ED (erectile dysfunction)   . ETOH abuse   . Hypertension   . Lymphoma, non Hodgkin's 09/25/2011   Stage 2  . Mitral regurgitation 01/20/2016  . Mobitz I 10/09/2015  . Murmur 01/06/2016  . Occasional tremors   . PAC (premature atrial contraction) 10/09/2015  . S/P minimally-invasive mitral valve repair 10/27/2016   Complex valvuloplasty including artificial Gore-tex neochord placement x6 and 34 mm Edwards Physio II ring annuloplasty via right mini thoracotomy approach    Medications:  Medications Prior to Admission  Medication Sig Dispense Refill Last Dose  . acetaminophen (TYLENOL) 650 MG CR tablet Take 650 mg by mouth every 8 (eight) hours as needed for pain.   01/08/2019 at Unknown time  . gabapentin (NEURONTIN) 100 MG capsule Take 1 capsule (100 mg total) by mouth 3 (three) times daily. (Patient taking differently: Take 100 mg by mouth 2 (two) times daily.  ) 90 capsule 2 01/08/2019 at Unknown time  . warfarin (COUMADIN) 5 MG tablet Take 5 mg by mouth daily.    01/08/2019 at 2130  . amoxicillin-clavulanate (AUGMENTIN) 875-125 MG tablet Take 1 tablet by mouth 2 (two) times daily for 10 days. (Patient not taking: Reported on 01/09/2019) 20 tablet 0 Not Taking at Unknown time  . chlordiazePOXIDE (LIBRIUM) 5 MG capsule Take 1 capsule (5 mg total) by mouth 3 (three) times daily as needed for anxiety. (Patient not taking: Reported on 01/09/2019) 12 capsule 0 Not Taking at Unknown time    Assessment: 58 YOM with h/o recurrent PE who presented to [name] with osteomyelitis of left big toe. Patient was on warfarin at home which has been discontinued for surgery. Pharmacy consulted to switch to IV heparin until after surgery. INR today is down to 1.5.   Heparin level came back therapeutic at 0.57, on 1450 units/hr. Hgb 10.4, plt 217. No s/sx of bleeding. No infusion issues.  Goal of Therapy:  Heparin level 0.3-0.7 units/ml Monitor platelets by anticoagulation protocol: Yes   Plan:  Continue heparin drip at 1450 units/hr Monitor daily HL, CBC and s/s of bleeding  Monitor for bleeding complications  Thanks for allowing pharmacy  to be a part of this patient's care.  Antonietta Jewel, PharmD, [place] Clinical Pharmacist  Pager: [phone] Phone: [phone] If after 3:30pm, please refer to [facility] for unit-specific pharmacist

## 2019-01-10 NOTE — Progress Notes (Signed)
ANTICOAGULATION CONSULT NOTE - Initial Consult  Pharmacy Consult for heparin Indication: pulmonary embolus  Allergies  Allergen Reactions  . Other Other (See Comments)    "Cactus" blisters on tongue    Patient Measurements:   Heparin Dosing Weight: 84.8 kg  Vital Signs: Temp: 98.5 F (36.9 C) (03/04 0527) Temp Source: Oral (03/04 0527) BP: 122/86 (03/04 0527) Pulse Rate: 115 (03/04 0527)  Labs: Recent Labs    01/09/19 1807 01/10/19 0123 01/10/19 0823  HGB 11.3* 10.4*  --   HCT 34.7* 32.2*  --   PLT 251 217  --   APTT  --  31  --   LABPROT  --  18.0*  --   INR  --  1.5*  --   HEPARINUNFRC  --   --  0.23*  CREATININE 0.85 0.96  --     Estimated Creatinine Clearance: 82.9 mL/min (by C-G formula based on SCr of 0.96 mg/dL).   Medical History: Past Medical History:  Diagnosis Date  . Acute pulmonary embolism ([place]) 04/11/2016  . Anxiety   . Arthritis   . Atrial tachycardia ([place]) 01/06/2016  . Depression   . DTs (delirium tremens) ([place])   . Dyspnea   . ED (erectile dysfunction)   . ETOH abuse   . Hypertension   . Lymphoma, non Hodgkin's 09/25/2011   Stage 2  . Mitral regurgitation 01/20/2016  . Mobitz I 10/09/2015  . Murmur 01/06/2016  . Occasional tremors   . PAC (premature atrial contraction) 10/09/2015  . S/P minimally-invasive mitral valve repair 10/27/2016   Complex valvuloplasty including artificial Gore-tex neochord placement x6 and 34 mm Edwards Physio II ring annuloplasty via right mini thoracotomy approach    Medications:  Medications Prior to Admission  Medication Sig Dispense Refill Last Dose  . acetaminophen (TYLENOL) 650 MG CR tablet Take 650 mg by mouth every 8 (eight) hours as needed for pain.   01/08/2019 at Unknown time  . gabapentin (NEURONTIN) 100 MG capsule Take 1 capsule (100 mg total) by mouth 3 (three) times daily. (Patient taking differently: Take 100 mg by mouth 2 (two) times daily. ) 90 capsule 2 01/08/2019 at Unknown time  . warfarin  (COUMADIN) 5 MG tablet Take 5 mg by mouth daily.    01/08/2019 at 2130  . amoxicillin-clavulanate (AUGMENTIN) 875-125 MG tablet Take 1 tablet by mouth 2 (two) times daily for 10 days. (Patient not taking: Reported on 01/09/2019) 20 tablet 0 Not Taking at Unknown time  . chlordiazePOXIDE (LIBRIUM) 5 MG capsule Take 1 capsule (5 mg total) by mouth 3 (three) times daily as needed for anxiety. (Patient not taking: Reported on 01/09/2019) 12 capsule 0 Not Taking at Unknown time    Assessment: 70 YOM with h/o recurrent PE who presented to [facility] with osteomyelitis of left big toe. Patient was on warfarin at home which has been discontinued for surgery. Pharmacy consulted to switch to IV heparin until after surgery. INR today is down to 1.5. Initial HL is subtherapeutic this AM on 1300 units/hr of IV heparin. H/H down, Plt wnl. No bleeding noted per RN   Goal of Therapy:  Heparin level 0.3-0.7 units/ml Monitor platelets by anticoagulation protocol: Yes   Plan:  Increase Heparin drip at 1450 units/hr Check 6 hr heparin level  Monitor daily HL, CBC and s/s of bleeding  Monitor for bleeding complications  Thanks for allowing pharmacy to be a part of this patient's care.  Albertina Parr, PharmD., BCPS Clinical Pharmacist Clinical phone for 01/10/19  until 3:30pm: [id number] If after 3:30pm, please refer to [facility] for unit-specific pharmacist

## 2019-01-10 NOTE — Progress Notes (Signed)
PROGRESS NOTE    Leonard Bentley  EYC:[id number] DOB: [birth year] DOA: 01/09/2019 PCP: [name], MD   Brief Narrative:  Per admitting physician: Leonard Bentley is a 58 y.o. male with medical history significant of recurrent pulmonary embolism, hypertension, alcoholism, tobacco abuse, peripheral vascular disease, non-Hodgkin's lymphoma who was sent over to the ER by Dr. [name] of orthopedics due to osteomyelitis of the left big toe.  He was being followed for nonhealing wound in the area.  He was on trial of of antibiotics treatment which has not decreased his risk.  He was last on Augmentin.  He has been quite having continued pain pain is 8 out of 10 with medications not helping.  He was having some mild tachycardia also when he visited the orthopedics office so he was sent over to the ER for evaluation.  Patient noted to have osteomyelitis with open wound.  He has been admitted to [facility] for schedule amputation of the left big toe this Friday..  ED Course: Temperature 98.1 blood pressure is 129/103 pulse 132 respirate 20 oxygen sat 97% on room air.  White count 3.9 hemoglobin 11.3 and platelets 251.  Chemistry otherwise is within normal lactic acid 1.5.  PT 19 point INR 1.63.  X-ray of the left Bentley showed acute focal decreased density involving the lateral aspect of the great toe consistent with osteomyelitis previously seen on MRI.   Assessment & Plan:   Principal Problem:   Osteomyelitis of great toe of left Bentley (HCC) Active Problems:   History of non-Hodgkin's lymphoma   Alcohol abuse   Cigarette smoker   SVT (supraventricular tachycardia) (HCC)   Recurrent pulmonary emboli (HCC)   HTN (hypertension)   #1 osteomyelitis of the left big toe: Patient has failed outpatient antibiotic treatment.    He will remain inpatient, continue on IV vancomycin and cefepime.  Blood cultures have been obtained and remain pending,.  Plan surgery on Friday.  #2 history of pulmonary embolism:  Currently on Coumadin and subtherapeutic.  With surgery coming admitting physician discontinued warfarin.  Initiated IV heparin which can be turned off [place] of surgery.  #3 alcohol abuse: Patient denied taking alcohol today.  He has had recent DTs.  We will continue with CIWA protocol empirically.  In the meantime continue monitoring.  #4 tobacco abuse: Nicotine patch will be continued with tobacco cessation counseling.  #5 hypertension: Continue blood pressure control.  #6 supraventricular tachycardia: Most likely secondary to alcohol withdrawals.  Patient is currently stable.  He has chronic SVT.  DVT prophylaxis: On:hep gtt  Code Status: Full code    Code Status Orders  (From admission, onward)         Start     Ordered   01/09/19 2321  Full code  Continuous     01/09/19 2320        Code Status History    Date Active Date Inactive Code Status Order ID Comments User Context   12/27/2018 1619 12/31/2018 1428 Full Code [id number]  Leonard Hillock, MD ED   12/07/2018 2253 12/10/2018 1723 Full Code [id number]  Leonard Grout, MD ED   11/27/2018 0528 11/28/2018 1548 Full Code [id number]  Leonard Grout, MD ED   11/02/2018 0135 11/08/2018 1511 Full Code [id number]  Leonard Costa, MD ED   11/02/2018 0053 11/02/2018 0135 Full Code [id number]  Leonard Blank, MD ED   08/30/2018 2305 09/01/2018 0042 Full Code [id number]  Leonard Baker, MD Inpatient   08/30/2018  1620 08/30/2018 2305 Full Code [id number]  Leonard Kuster, PA-C ED   07/25/2018 1557 07/30/2018 1714 Full Code [id number]  Leonard Bongo, MD ED   06/04/2018 2302 06/13/2018 0204 Full Code [id number]  Leonard Patience, MD Inpatient   11/27/2016 2247 11/28/2016 1715 Full Code [id number]  Leonard Baker, MD Inpatient   10/27/2016 1420 10/31/2016 1519 Full Code [id number]  Leonard Alberts, MD Inpatient   08/13/2016 0855 08/13/2016 1559 Full Code [id number]  Leonard Booze, MD Inpatient   04/02/2016 1736 04/03/2016 1820 Full Code  [id number]  Leonard Parr, NP Inpatient   01/31/2013 0615 02/02/2013 1800 Full Code [id number]  Leonard Patience, MD Inpatient   01/30/2013 2133 01/31/2013 0615 Full Code [id number]  Leonard Bentley., MD ED   12/28/2012 1235 01/01/2013 1336 Full Code [id number]  Leonard Lose, MD ED   08/24/2012 0423 08/25/2012 1426 Full Code [id number]  Leonard Bentley., DO ED     Family Communication: None present Disposition Plan:   Likely discharge home pending postoperative recovery Consults called: Orthopedics on admission Admission status: Inpatient   Consultants:   ortho  Procedures:  Dg Hand 2 View Left  Result Date: 12/28/2018 CLINICAL DATA:  Distal left index finger pain following a crush injury 3 weeks ago. EXAM: LEFT HAND - 2 VIEW COMPARISON:  None. FINDINGS: There is no evidence of fracture or dislocation. There is no evidence of arthropathy or other focal bone abnormality. Soft tissues are unremarkable. IMPRESSION: Negative. Electronically Signed   By: Claudie Revering M.D.   On: 12/28/2018 12:44   Leonard Bentley Left [name]  Result Date: 12/27/2018 CLINICAL DATA:  Worsening infection of the left great toe despite antibiotics. EXAM: MRI OF THE LEFT FOREFOOT WITHOUT AND WITH CONTRAST TECHNIQUE: Multiplanar, multisequence Leonard imaging of the left Bentley was performed both before and after administration of intravenous contrast. CONTRAST:  8 mm mild Gadavist IV COMPARISON:  Left Bentley radiographs 12/07/2018 and great toe radiographs 12/27/2018 FINDINGS: Bones/Joint/Cartilage Subtle cortical bone loss along the dorsal medial aspect of the distal phalanx of the left great toe, series 4/11. Marrow edema of the distal phalanx concerning for early changes of osteomyelitis are noted, series 8/15. No acute fracture joint dislocations. Subcortical cyst involving the lateral cuneiform is identified measuring approximately 8 x 5 x 6 mm. Ligaments Negative Muscles and Tendons No evidence of pyomyositis. Mild  intramuscular edema likely neurogenic. Soft tissues Small ganglion cyst along the dorsum of the first proximal phalanx measuring approximately 11 x 6 x 9 mm. Diffuse soft tissue edema is noted consistent with cellulitis. IMPRESSION: 1. Diffuse subcutaneous soft tissue edema the forefoot consistent with history of cellulitis. 2. Subtle cortical bone loss along the dorsal medial aspect of the distal phalanx of the great toe more uniform marrow edema of the distal phalanx raising concern for changes of osteomyelitis. 3. Small ganglion cyst along the dorsum the first proximal phalanx measuring 11 x 6 x 9 mm. 4. Degenerative subcortical cyst of the lateral cuneiform measuring 8 x 5 x 6 mm. Electronically Signed   By: Ashley Royalty M.D.   On: 12/27/2018 21:28   Dg Bentley Complete Left  Result Date: 01/09/2019 CLINICAL DATA:  Great toe wound for 4 weeks. Pain and infection. EXAM: LEFT Bentley - COMPLETE 3+ VIEW COMPARISON:  Great toe radiographs and forefoot MRI 12/27/2018 FINDINGS: No bony destructive change. Equivocal decreased density involving the lateral aspect of the great toe distal tuft at site of osteomyelitis on prior MRI.  No periosteal reaction or frank osseous erosions. No soft tissue air or radiopaque foreign body. Soft tissue thickening of the digit. Hammertoe deformity of the second through fifth digits. No findings elsewhere to suggest osteomyelitis. Well corticated density about the dorsal talus may represent sequela of remote prior injury or accessory ossicle. IMPRESSION: Equivocal decreased density involving the lateral aspect of the great toe distal tuft at site of osteomyelitis on prior MRI. No frank bony destructive change. No soft tissue air or radiopaque foreign body. Electronically Signed   By: Keith Rake M.D.   On: 01/09/2019 19:07   Dg Toe Great Left  Result Date: 12/27/2018 CLINICAL DATA:  necrotic great toe, please eval for possible osteomyelitis/gasPatient reports pain in left great toe  for 3 weeks. Patient states it started as blister along dorsal surface of great toe 3 weeks ago, he placed a band-aid on it and 2 weeks ago his distal great toe started to turn black. Pain is localized to great toe and does not radiate into rest of Bentley. No prior surgery or injury to great toe. EXAM: LEFT GREAT TOE COMPARISON:  12/07/2018 FINDINGS: There is no evidence of fracture or dislocation. No focal cortical erosion to suggest osteomyelitis. There is no evidence of arthropathy or other focal bone abnormality. Soft tissue swelling about the distal phalanx. No subcutaneous gas or radiodense foreign body. IMPRESSION: 1. Soft tissue swelling without bony abnormality. 2. No radiographic evidence of osteomyelitis. The Electronically Signed   By: Lucrezia Europe M.D.   On: 12/27/2018 14:55     Antimicrobials:   Vancomycin and cefepime [place] #2   Subjective: Patient reported pain on Bentley at site of osteomyelitis and plans amputation.  Otherwise no acute events overnight  Objective: Vitals:   01/09/19 2123 01/09/19 2210 01/09/19 2321 01/10/19 0527  BP: (!) 117/104 (!) 129/103 (!) 128/96 122/86  Pulse: (!) 124 88 (!) 120 (!) 115  Resp:  13  18  Temp:   98.6 F (37 C) 98.5 F (36.9 C)  TempSrc:   Oral Oral  SpO2:  98% 100% 99%    Intake/Output Summary (Last 24 hours) at 01/10/2019 1214 Last data filed at 01/09/2019 2211 Gross per 24 hour  Intake 1000 ml  Output -  Net 1000 ml   There were no vitals filed for this visit.  Examination:  General exam: Appears calm and comfortable  Respiratory system: Clear to auscultation. Respiratory effort normal. Cardiovascular system: S1 & S2 heard, RRR. No JVD, murmurs, rubs, gallops or clicks. No pedal edema. Gastrointestinal system: Abdomen is nondistended, soft and nontender. No organomegaly or masses felt. Normal bowel sounds heard. Central nervous system: Alert and oriented. No focal neurological deficits. Extremities: Symmetric 5 x 5 power. Skin:  Ulceration on left big toe.  Open wound with purulence Psychiatry: Judgement and insight appear normal. Mood & affect appropriate.     Data Reviewed: I have personally reviewed following labs and imaging studies  CBC: Recent Labs  Lab 01/09/19 1807 01/10/19 0123  WBC 3.9* 3.0*  NEUTROABS 2.5  --   HGB 11.3* 10.4*  HCT 34.7* 32.2*  MCV 81.3 79.5*  PLT 251 924   Basic Metabolic Panel: Recent Labs  Lab 01/09/19 1807 01/10/19 0123  NA 138 136  K 4.3 4.2  CL 103 104  CO2 26 25  GLUCOSE 98 103*  BUN 14 12  CREATININE 0.85 0.96  CALCIUM 9.3 8.8*   GFR: Estimated Creatinine Clearance: 82.9 mL/min (by C-G formula based on SCr of  0.96 mg/dL). Liver Function Tests: Recent Labs  Lab 01/10/19 0123  AST 38  ALT 25  ALKPHOS 41  BILITOT 1.1  PROT 5.9*  ALBUMIN 3.2*   No results for input(s): LIPASE, AMYLASE in the last 168 hours. No results for input(s): AMMONIA in the last 168 hours. Coagulation Profile: Recent Labs  Lab 01/10/19 0123  INR 1.5*   Cardiac Enzymes: No results for input(s): CKTOTAL, CKMB, CKMBINDEX, TROPONINI in the last 168 hours. BNP (last 3 results) No results for input(s): PROBNP in the last 8760 hours. HbA1C: No results for input(s): HGBA1C in the last 72 hours. CBG: No results for input(s): GLUCAP in the last 168 hours. Lipid Profile: No results for input(s): CHOL, HDL, LDLCALC, TRIG, CHOLHDL, LDLDIRECT in the last 72 hours. Thyroid Function Tests: No results for input(s): TSH, T4TOTAL, FREET4, T3FREE, THYROIDAB in the last 72 hours. Anemia Panel: No results for input(s): VITAMINB12, FOLATE, FERRITIN, TIBC, IRON, RETICCTPCT in the last 72 hours. Sepsis Labs: Recent Labs  Lab 01/09/19 1827  LATICACIDVEN 1.4    Recent Results (from the past 240 hour(s))  Culture, blood (routine x 2)     Status: None (Preliminary result)   Collection Time: 01/09/19  6:27 PM  Result Value Ref Range Status   Specimen Description   Final    BLOOD LEFT  ANTECUBITAL Performed at [facility] [address] [place], [place] [address]    Special Requests   Final    BOTTLES DRAWN AEROBIC AND ANAEROBIC Blood Culture adequate volume Performed at [facility] [address], [place], [place] [address]    Culture   Final    NO GROWTH < 24 HOURS Performed at [facility] [address], [place], [place] [address]    Report Status PENDING  Incomplete  Culture, blood (routine x 2)     Status: None (Preliminary result)   Collection Time: 01/09/19  6:50 PM  Result Value Ref Range Status   Specimen Description   Final    BLOOD LEFT ANTECUBITAL Performed at [facility] [address], [place], [place] [address]    Special Requests   Final    BOTTLES DRAWN AEROBIC AND ANAEROBIC Blood Culture adequate volume Performed at [facility] [address] [place], [place] [address]    Culture   Final    NO GROWTH < 24 HOURS Performed at [facility] [address], [place], [place] [address]    Report Status PENDING  Incomplete         Radiology Studies: Dg Bentley Complete Left  Result Date: 01/09/2019 CLINICAL DATA:  Great toe wound for 4 weeks. Pain and infection. EXAM: LEFT Bentley - COMPLETE 3+ VIEW COMPARISON:  Great toe radiographs and forefoot MRI 12/27/2018 FINDINGS: No bony destructive change. Equivocal decreased density involving the lateral aspect of the great toe distal tuft at site of osteomyelitis on prior MRI. No periosteal reaction or frank osseous erosions. No soft tissue air or radiopaque foreign body. Soft tissue thickening of the digit. Hammertoe deformity of the second through fifth digits. No findings elsewhere to suggest osteomyelitis. Well corticated density about the dorsal talus may represent sequela of remote prior injury or accessory ossicle. IMPRESSION: Equivocal decreased density involving the lateral aspect of the great toe distal  tuft at site of osteomyelitis on prior MRI. No frank bony destructive change. No soft tissue air or radiopaque foreign body. Electronically Signed   By: Threasa Beards  Sanford M.D.   On: 01/09/2019 19:07        Scheduled Meds: . LORazepam  0-4 mg Intravenous Q6H   Or  . LORazepam  0-4 mg Oral Q6H  . [START ON 01/12/2019] LORazepam  0-4 mg Intravenous Q12H   Or  . [START ON 01/12/2019] LORazepam  0-4 mg Oral Q12H  . nicotine  21 mg Transdermal Daily  . thiamine  100 mg Oral Daily   Or  . thiamine  100 mg Intravenous Daily   Continuous Infusions: . sodium chloride 75 mL/hr at 01/09/19 2344  . ceFEPime (MAXIPIME) IV 2 g (01/10/19 0402)  . heparin 1,450 Units/hr (01/10/19 0952)  . vancomycin 1,250 mg (01/10/19 0949)     LOS: 1 [place]    Time spent: 34 min    Nicolette Bang, MD [facility]  If 7PM-7AM, please contact night-coverage  01/10/2019, 12:14 PM

## 2019-01-10 NOTE — Care Management Note (Signed)
Case Management Note  Patient Details  Name: Leonard Bentley MRN: [id number] Date of Birth: 08-09-1961  Subjective/Objective:                 Planned amputation of toe Friday   Action/Plan:  Patient admitted from home, lives alone in house. States that he has cane and "maybe" a RW at home. He has had 7 admissions in 6 months and used [facility] program twice already this year. He states that he goes to [facility] for PCP and pays for his meds out of pocket. He denies having orange card but says that he keeps meaning to go get it. CM will continue to follow Expected Discharge Date:  (unknown)               Expected Discharge Plan:     In-House Referral:     Discharge planning Services  CM Consult  Post Acute Care Choice:    Choice offered to:     DME Arranged:    DME Agency:     HH Arranged:    HH Agency:     Status of Service:  In process, will continue to follow  If discussed at Long Length of Stay Meetings, dates discussed:    Additional Comments:  Leonard Collet, RN 01/10/2019, 4:25 PM

## 2019-01-11 ENCOUNTER — Other Ambulatory Visit: Payer: Self-pay

## 2019-01-11 ENCOUNTER — Ambulatory Visit (INDEPENDENT_AMBULATORY_CARE_PROVIDER_SITE_OTHER): Payer: Self-pay | Admitting: Physician Assistant

## 2019-01-11 DIAGNOSIS — F17210 Nicotine dependence, cigarettes, uncomplicated: Secondary | ICD-10-CM

## 2019-01-11 DIAGNOSIS — Z8572 Personal history of non-Hodgkin lymphomas: Secondary | ICD-10-CM

## 2019-01-11 DIAGNOSIS — F1010 Alcohol abuse, uncomplicated: Secondary | ICD-10-CM

## 2019-01-11 LAB — HEPARIN LEVEL (UNFRACTIONATED): Heparin Unfractionated: 0.64 IU/mL (ref 0.30–0.70)

## 2019-01-11 MED ORDER — ENSURE ENLIVE PO LIQD
237.0000 mL | Freq: Two times a day (BID) | ORAL | Status: DC
  Administered 2019-01-11 – 2019-01-21 (×16): 237 mL via ORAL
  Filled 2019-01-11: qty 237

## 2019-01-11 MED ORDER — CHLORHEXIDINE GLUCONATE 4 % EX LIQD
60.0000 mL | Freq: Once | CUTANEOUS | Status: AC
  Administered 2019-01-12: 4 via TOPICAL
  Filled 2019-01-11: qty 60

## 2019-01-11 MED ORDER — PRO-STAT SUGAR FREE PO LIQD
30.0000 mL | Freq: Two times a day (BID) | ORAL | Status: DC
  Administered 2019-01-11 – 2019-01-21 (×19): 30 mL via ORAL
  Filled 2019-01-11 (×21): qty 30

## 2019-01-11 NOTE — Progress Notes (Signed)
PROGRESS NOTE    Leonard Bentley  ONG:[id number] DOB: Jun 18, 1961 DOA: 01/09/2019 PCP: [name], MD   Brief Narrative:  Per admitting physician: [name] a 58 y.o.malewith medical history significant ofrecurrent pulmonary embolism, hypertension, alcoholism, tobacco abuse, peripheral vascular disease, non-Hodgkin's lymphoma who was sent over to the ER by [facility] due to osteomyelitis of the left big toe. He was being followed for nonhealing wound in the area. He was on trial of of antibiotics treatment which has not decreased his risk. He was last on Augmentin. He has been quite having continued pain pain is 8 out of 10 with medications not helping. He was having some mild tachycardia also when he visited the orthopedics office so he was sent over to the ER for evaluation. Patient noted to have osteomyelitis with open wound. He has been admitted to [facility] for schedule amputation of the left big toe this Friday..  ED Course:Temperature 98.1 blood pressure is 129/103 pulse 132 respirate 20 oxygen sat 97% on room air. White count 3.9 hemoglobin 11.3 and platelets 251. Chemistry otherwise is within normal lactic acid 1.5. PT 19 point INR 1.63. X-ray of the left foot showed acute focal decreased density involving the lateral aspect of the great toe consistent with osteomyelitis previously seen on MRI.   Assessment & Plan:   Principal Problem:   Osteomyelitis of great toe of left foot (HCC) Active Problems:   History of non-Hodgkin's lymphoma   Alcohol abuse   Cigarette smoker   SVT (supraventricular tachycardia) (HCC)   Recurrent pulmonary emboli (HCC)   HTN (hypertension)   #1 osteomyelitis of the left big GMW:NUUVOZD has failed outpatient antibiotic treatment.   He will remain inpatient, continue on IV vancomycin and cefepime. Blood cultures have been obtained and are negative,. Plan surgery on Friday -.ortho saw in consultation.  #2 history of  pulmonary embolism: Currently on Coumadin and subtherapeutic. With surgery coming admitting physician discontinued warfarin. Initiated IV heparin on admission, discussed with pharmacy and am stopping heparin tonight at midnight in anticipation of surgery in the morning.  #3 alcohol abuse:Patient denied taking alcohol PTA although he has had recent DTs. We will continue with CIWA protocol empirically. In the meantime continue monitoring.  #4 tobacco abuse:Nicotine patch will be continued with tobacco cessation counseling.  #5 hypertension:Continue blood pressure control.  #6 supraventricular tachycardia:Most likely secondary to alcohol withdrawals. Patient is currently stable. He has chronic SVT.  DVT prophylaxis: Heparin drip Code Status: Full code    Code Status Orders  (From admission, onward)         Start     Ordered   01/09/19 2321  Full code  Continuous     01/09/19 2320        Code Status History    Date Active Date Inactive Code Status Order ID Comments User Context   12/27/2018 1619 12/31/2018 1428 Full Code [id number]  Oswald Hillock, MD ED   12/07/2018 2253 12/10/2018 1723 Full Code [id number]  [name], MD ED   11/27/2018 0528 11/28/2018 1548 Full Code [id number]  [name], MD ED   11/02/2018 0135 11/08/2018 1511 Full Code [id number]  [name], MD ED   11/02/2018 0053 11/02/2018 0135 Full Code [id number]  [name], MD ED   08/30/2018 2305 09/01/2018 0042 Full Code [id number]  [name], MD Inpatient   08/30/2018 1620 08/30/2018 2305 Full Code [id number]  [name], PA-C ED   07/25/2018 1557 07/30/2018 1714  Full Code [id number]  [name], MD ED   06/04/2018 2302 06/13/2018 0204 Full Code [id number]  [name], MD Inpatient   11/27/2016 2247 11/28/2016 1715 Full Code [id number]  [name], MD Inpatient   10/27/2016 1420 10/31/2016 1519 Full Code [id number]  [name], MD Inpatient   08/13/2016 0855  08/13/2016 1559 Full Code [id number]  [name], MD Inpatient   04/02/2016 1736 04/03/2016 1820 Full Code [id number]  [name], NP Inpatient   01/31/2013 0615 02/02/2013 1800 Full Code [id number]  [name], MD Inpatient   01/30/2013 2133 01/31/2013 0615 Full Code [id number]  [name], MD ED   12/28/2012 1235 01/01/2013 1336 Full Code [id number]  [name], MD ED   08/24/2012 0423 08/25/2012 1426 Full Code [id number]  [name]., DO ED     Family Communication: None present Disposition Plan:   Plans amputation tomorrow disposition pending clinical improvement dissipate likely 2 days postop Consults called: None today Admission status: Inpatient   Consultants:   Orthopedics  Procedures:  Dg Hand 2 View Left  Result Date: 12/28/2018 CLINICAL DATA:  Distal left index finger pain following a crush injury 3 weeks ago. EXAM: LEFT HAND - 2 VIEW COMPARISON:  None. FINDINGS: There is no evidence of fracture or dislocation. There is no evidence of arthropathy or other focal bone abnormality. Soft tissues are unremarkable. IMPRESSION: Negative. Electronically Signed   By: Claudie Revering M.D.   On: 12/28/2018 12:44   Mr Foot Left [name]  Result Date: 12/27/2018 CLINICAL DATA:  Worsening infection of the left great toe despite antibiotics. EXAM: MRI OF THE LEFT FOREFOOT WITHOUT AND WITH CONTRAST TECHNIQUE: Multiplanar, multisequence MR imaging of the left foot was performed both before and after administration of intravenous contrast. CONTRAST:  8 mm mild Gadavist IV COMPARISON:  Left foot radiographs 12/07/2018 and great toe radiographs 12/27/2018 FINDINGS: Bones/Joint/Cartilage Subtle cortical bone loss along the dorsal medial aspect of the distal phalanx of the left great toe, series 4/11. Marrow edema of the distal phalanx concerning for early changes of osteomyelitis are noted, series 8/15. No acute fracture joint dislocations. Subcortical cyst involving  the lateral cuneiform is identified measuring approximately 8 x 5 x 6 mm. Ligaments Negative Muscles and Tendons No evidence of pyomyositis. Mild intramuscular edema likely neurogenic. Soft tissues Small ganglion cyst along the dorsum of the first proximal phalanx measuring approximately 11 x 6 x 9 mm. Diffuse soft tissue edema is noted consistent with cellulitis. IMPRESSION: 1. Diffuse subcutaneous soft tissue edema the forefoot consistent with history of cellulitis. 2. Subtle cortical bone loss along the dorsal medial aspect of the distal phalanx of the great toe more uniform marrow edema of the distal phalanx raising concern for changes of osteomyelitis. 3. Small ganglion cyst along the dorsum the first proximal phalanx measuring 11 x 6 x 9 mm. 4. Degenerative subcortical cyst of the lateral cuneiform measuring 8 x 5 x 6 mm. Electronically Signed   By: Ashley Royalty M.D.   On: 12/27/2018 21:28   Dg Foot Complete Left  Result Date: 01/09/2019 CLINICAL DATA:  Great toe wound for 4 weeks. Pain and infection. EXAM: LEFT FOOT - COMPLETE 3+ VIEW COMPARISON:  Great toe radiographs and forefoot MRI 12/27/2018 FINDINGS: No bony destructive change. Equivocal decreased density involving the lateral aspect of the great toe distal tuft at site of osteomyelitis on prior MRI. No periosteal reaction or frank osseous erosions. No soft tissue air or radiopaque  foreign body. Soft tissue thickening of the digit. Hammertoe deformity of the second through fifth digits. No findings elsewhere to suggest osteomyelitis. Well corticated density about the dorsal talus may represent sequela of remote prior injury or accessory ossicle. IMPRESSION: Equivocal decreased density involving the lateral aspect of the great toe distal tuft at site of osteomyelitis on prior MRI. No frank bony destructive change. No soft tissue air or radiopaque foreign body. Electronically Signed   By: Keith Rake M.D.   On: 01/09/2019 19:07   Dg Toe Great  Left  Result Date: 12/27/2018 CLINICAL DATA:  necrotic great toe, please eval for possible osteomyelitis/gasPatient reports pain in left great toe for 3 weeks. Patient states it started as blister along dorsal surface of great toe 3 weeks ago, he placed a band-aid on it and 2 weeks ago his distal great toe started to turn black. Pain is localized to great toe and does not radiate into rest of foot. No prior surgery or injury to great toe. EXAM: LEFT GREAT TOE COMPARISON:  12/07/2018 FINDINGS: There is no evidence of fracture or dislocation. No focal cortical erosion to suggest osteomyelitis. There is no evidence of arthropathy or other focal bone abnormality. Soft tissue swelling about the distal phalanx. No subcutaneous gas or radiodense foreign body. IMPRESSION: 1. Soft tissue swelling without bony abnormality. 2. No radiographic evidence of osteomyelitis. The Electronically Signed   By: Lucrezia Europe M.D.   On: 12/27/2018 14:55     Antimicrobials:   Vancomycin and cefepime [place] #3   Subjective: Patient reported pain control improved with Percocet.  Resting in bed comfortably no acute events overnight mildly anxious about surgical procedure  Objective: Vitals:   01/10/19 2010 01/11/19 0433 01/11/19 0447 01/11/19 1350  BP: 98/70 (!) 114/91 102/68 114/75  Pulse: 77 81 82 75  Resp:  16  17  Temp: 98.1 F (36.7 C) 98.1 F (36.7 C) 98.2 F (36.8 C) 98.3 F (36.8 C)  TempSrc: Oral Oral Oral Oral  SpO2:  96%  96%    Intake/Output Summary (Last 24 hours) at 01/11/2019 1357 Last data filed at 01/11/2019 1000 Gross per 24 hour  Intake 2976.55 ml  Output 575 ml  Net 2401.55 ml   There were no vitals filed for this visit.  Examination:  General exam: Appears calm and comfortable  Respiratory system: Clear to auscultation. Respiratory effort normal. Cardiovascular system: S1 & S2 heard, RRR. No JVD, murmurs, rubs, gallops or clicks. No pedal edema. Gastrointestinal system: Abdomen is  nondistended, soft and nontender. No organomegaly or masses felt. Normal bowel sounds heard. Central nervous system: Alert and oriented. No focal neurological deficits. Extremities: Ulceration on great big toe left side as previously noted no changes. Skin: No rashes, lesions or ulcers other than as noted above Psychiatry: Judgement and insight appear normal. Mood & affect appropriate.     Data Reviewed: I have personally reviewed following labs and imaging studies  CBC: Recent Labs  Lab 01/09/19 1807 01/10/19 0123  WBC 3.9* 3.0*  NEUTROABS 2.5  --   HGB 11.3* 10.4*  HCT 34.7* 32.2*  MCV 81.3 79.5*  PLT 251 053   Basic Metabolic Panel: Recent Labs  Lab 01/09/19 1807 01/10/19 0123  NA 138 136  K 4.3 4.2  CL 103 104  CO2 26 25  GLUCOSE 98 103*  BUN 14 12  CREATININE 0.85 0.96  CALCIUM 9.3 8.8*   GFR: Estimated Creatinine Clearance: 82.9 mL/min (by C-G formula based on SCr of 0.96  mg/dL). Liver Function Tests: Recent Labs  Lab 01/10/19 0123  AST 38  ALT 25  ALKPHOS 41  BILITOT 1.1  PROT 5.9*  ALBUMIN 3.2*   No results for input(s): LIPASE, AMYLASE in the last 168 hours. No results for input(s): AMMONIA in the last 168 hours. Coagulation Profile: Recent Labs  Lab 01/10/19 0123  INR 1.5*   Cardiac Enzymes: No results for input(s): CKTOTAL, CKMB, CKMBINDEX, TROPONINI in the last 168 hours. BNP (last 3 results) No results for input(s): PROBNP in the last 8760 hours. HbA1C: No results for input(s): HGBA1C in the last 72 hours. CBG: No results for input(s): GLUCAP in the last 168 hours. Lipid Profile: No results for input(s): CHOL, HDL, LDLCALC, TRIG, CHOLHDL, LDLDIRECT in the last 72 hours. Thyroid Function Tests: No results for input(s): TSH, T4TOTAL, FREET4, T3FREE, THYROIDAB in the last 72 hours. Anemia Panel: No results for input(s): VITAMINB12, FOLATE, FERRITIN, TIBC, IRON, RETICCTPCT in the last 72 hours. Sepsis Labs: Recent Labs  Lab  01/09/19 1827  LATICACIDVEN 1.4    Recent Results (from the past 240 hour(s))  Culture, blood (routine x 2)     Status: None (Preliminary result)   Collection Time: 01/09/19  6:27 PM  Result Value Ref Range Status   Specimen Description   Final    BLOOD LEFT ANTECUBITAL Performed at [facility] [address] [place], [place] [address]    Special Requests   Final    BOTTLES DRAWN AEROBIC AND ANAEROBIC Blood Culture adequate volume Performed at [facility] [address], [place], [place] [address]    Culture   Final    NO GROWTH 2 DAYS Performed at [facility] [address] [place], [place] [address]    Report Status PENDING  Incomplete  Culture, blood (routine x 2)     Status: None (Preliminary result)   Collection Time: 01/09/19  6:50 PM  Result Value Ref Range Status   Specimen Description   Final    BLOOD LEFT ANTECUBITAL Performed at [facility] [address] [place], [place] [address]    Special Requests   Final    BOTTLES DRAWN AEROBIC AND ANAEROBIC Blood Culture adequate volume Performed at [facility] [address], [place], [place] [address]    Culture   Final    NO GROWTH 2 DAYS Performed at [facility] [address] [place], [place] [address]    Report Status PENDING  Incomplete         Radiology Studies: Dg Foot Complete Left  Result Date: 01/09/2019 CLINICAL DATA:  Great toe wound for 4 weeks. Pain and infection. EXAM: LEFT FOOT - COMPLETE 3+ VIEW COMPARISON:  Great toe radiographs and forefoot MRI 12/27/2018 FINDINGS: No bony destructive change. Equivocal decreased density involving the lateral aspect of the great toe distal tuft at site of osteomyelitis on prior MRI. No periosteal reaction or frank osseous erosions. No soft tissue air or radiopaque foreign body. Soft tissue thickening of the digit. Hammertoe deformity of the second through fifth  digits. No findings elsewhere to suggest osteomyelitis. Well corticated density about the dorsal talus may represent sequela of remote prior injury or accessory ossicle. IMPRESSION: Equivocal decreased density involving the lateral aspect of the great toe distal tuft at site of osteomyelitis on prior MRI. No frank bony destructive change. No soft tissue air or radiopaque foreign body. Electronically Signed   By: Aurther Loft.D.  On: 01/09/2019 19:07        Scheduled Meds: . [START ON 01/12/2019] chlorhexidine  60 mL Topical Once  . feeding supplement (ENSURE ENLIVE)  237 mL Oral BID BM  . feeding supplement (PRO-STAT SUGAR FREE 64)  30 mL Oral BID  . LORazepam  0-4 mg Intravenous Q6H   Or  . LORazepam  0-4 mg Oral Q6H  . [START ON 01/12/2019] LORazepam  0-4 mg Intravenous Q12H   Or  . [START ON 01/12/2019] LORazepam  0-4 mg Oral Q12H  . nicotine  21 mg Transdermal Daily  . thiamine  100 mg Oral Daily   Or  . thiamine  100 mg Intravenous Daily   Continuous Infusions: . sodium chloride 75 mL/hr at 01/09/19 2344  . ceFEPime (MAXIPIME) IV 2 g (01/11/19 1607)  . heparin 1,450 Units/hr (01/10/19 2037)  . vancomycin 1,250 mg (01/11/19 1116)     LOS: 2 days    Time spent: 2 min    Nicolette Bang, MD [facility]  If 7PM-7AM, please contact night-coverage  01/11/2019, 1:57 PM

## 2019-01-11 NOTE — Progress Notes (Signed)
[facility] for heparin Indication: pulmonary embolus  Allergies  Allergen Reactions  . Other Other (See Comments)    "Cactus" blisters on tongue    Patient Measurements: Wt: 84.8 kg Ht: 5'9" Heparin Dosing Weight: 84.8 kg  Vital Signs: Temp: 98.2 F (36.8 C) (03/05 0447) Temp Source: Oral (03/05 0447) BP: 102/68 (03/05 0447) Pulse Rate: 82 (03/05 0447)  Labs: Recent Labs    01/09/19 1807 01/10/19 0123 01/10/19 0823 01/10/19 1549 01/11/19 0254  HGB 11.3* 10.4*  --   --   --   HCT 34.7* 32.2*  --   --   --   PLT 251 217  --   --   --   APTT  --  31  --   --   --   LABPROT  --  18.0*  --   --   --   INR  --  1.5*  --   --   --   HEPARINUNFRC  --   --  0.23* 0.57 0.64  CREATININE 0.85 0.96  --   --   --     Estimated Creatinine Clearance: 82.9 mL/min (by C-G formula based on SCr of 0.96 mg/dL).   Medical History: Past Medical History:  Diagnosis Date  . Acute pulmonary embolism ([facility]) 04/11/2016  . Anxiety   . Arthritis   . Atrial tachycardia ([facility]) 01/06/2016  . Depression   . DTs (delirium tremens) ([facility])   . Dyspnea   . ED (erectile dysfunction)   . ETOH abuse   . Hypertension   . Lymphoma, non Hodgkin's 09/25/2011   Stage 2  . Mitral regurgitation 01/20/2016  . Mobitz I 10/09/2015  . Murmur 01/06/2016  . Occasional tremors   . PAC (premature atrial contraction) 10/09/2015  . S/P minimally-invasive mitral valve repair 10/27/2016   Complex valvuloplasty including artificial Gore-tex neochord placement x6 and 34 mm Edwards Physio II ring annuloplasty via right mini thoracotomy approach    Medications:  Medications Prior to Admission  Medication Sig Dispense Refill Last Dose  . acetaminophen (TYLENOL) 650 MG CR tablet Take 650 mg by mouth every 8 (eight) hours as needed for pain.   01/08/2019 at Unknown time  . gabapentin (NEURONTIN) 100 MG capsule Take 1 capsule (100 mg total) by mouth 3 (three) times daily. (Patient taking  differently: Take 100 mg by mouth 2 (two) times daily. ) 90 capsule 2 01/08/2019 at Unknown time  . warfarin (COUMADIN) 5 MG tablet Take 5 mg by mouth daily.    01/08/2019 at 2130  . [EXPIRED] amoxicillin-clavulanate (AUGMENTIN) 875-125 MG tablet Take 1 tablet by mouth 2 (two) times daily for 10 days. (Patient not taking: Reported on 01/09/2019) 20 tablet 0 Not Taking at Unknown time  . chlordiazePOXIDE (LIBRIUM) 5 MG capsule Take 1 capsule (5 mg total) by mouth 3 (three) times daily as needed for anxiety. (Patient not taking: Reported on 01/09/2019) 12 capsule 0 Not Taking at Unknown time    Assessment: 30 YOM with h/o recurrent PE who presented to [facility] with osteomyelitis of left big toe. Patient was on warfarin at home which has been discontinued for surgery. Pharmacy consulted to switch to IV heparin until after surgery. INR today is down to 1.5.   Heparin level this AM remains therapeutic. No s/s of bleeding noted by RN   Goal of Therapy:  Heparin level 0.3-0.7 units/ml Monitor platelets by anticoagulation protocol: Yes   Plan:  Continue heparin drip at 1450 units/hr  Monitor daily HL, CBC and s/s of bleeding  Monitor for bleeding complications  Thanks for allowing pharmacy to be a part of this patient's care.  Albertina Parr, PharmD., BCPS Clinical Pharmacist Clinical phone for 01/11/19 until 3:30pm: [phone] If after 3:30pm, please refer to [facility] for unit-specific pharmacist

## 2019-01-11 NOTE — Progress Notes (Signed)
Initial Nutrition Assessment  DOCUMENTATION CODES:   Not applicable  INTERVENTION:  Ensure Enlive po BID, each supplement provides 350 kcal and 20 grams of protein (vanilla) Prostat po BID, each supplement provides 100 kcal and 15 grams of protein   NUTRITION DIAGNOSIS:   Increased nutrient needs related to wound healing as evidenced by estimated needs.   GOAL:   Patient will meet greater than or equal to 90% of their needs  MONITOR:   PO intake, Supplement acceptance, Labs, Skin  REASON FOR ASSESSMENT:   Malnutrition Screening Tool    ASSESSMENT:  58 year old male with medical history significant of recurrent pulmonary embolism, HTN, EtOH, non-Hodgkin's lymphoma, recent [facility] d/c 2/19 for non healing wound of left big toe admitted for osteomyelitis with open wound; scheduled for amputation on Friday  Patient seen by dietetic intern on 2/20; note stated that patient had lost 5lbs over the previous month d/t emotional stressors and goes to the gym 2-3x/week. No fat or muscle depletion upon last NFPE.   Very pleasant patient who reports feeling pretty good today but feeling somewhat anxious about the amputation tomorrow. He reports he has been eating well during his stay and trying to make better food choices. He recalls grilled chx salads and [place] burgers during admission. At home patient reports 1 meal/Scroggin consisting of canned calamari on triscuits, 1/2 gal of H20, and 1 bottle of red wine daily.   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 He reports UBW of 187 lbs and stated that he eats once a Cropper to promote his IBW of 170lb. RD encouraged patient to increase meal intake and implement the healthier eating patterns he is engaged in during his recent admissions once he returns home. RD encouraged patient to increase H2O intake and decrease EtOH consumption and discussed the empty calories that it provides.    Patient open to ONS during his stay and stated that he received Pro-stat during his last admission and he really enjoyed it.   Medications reviewed and include: Ativan, Thiamine, Oxycodone  Labs: non pertinent at this time  3/6: Scheduled amputation of great toe NUTRITION - FOCUSED PHYSICAL EXAM:    Most Recent Value  Orbital Region  No depletion  Upper Arm Region  No depletion  Thoracic and Lumbar Region  No depletion  Buccal Region  No depletion  Temple Region  No depletion  Clavicle Bone Region  No depletion  Clavicle and Acromion Bone Region  No depletion  Scapular Bone Region  No depletion  Dorsal Hand  No depletion  Patellar Region  No depletion  Anterior Thigh Region  No depletion  Posterior Calf Region  No depletion  Edema (RD Assessment)  Mild  Hair  Unable to assess [pt wearing fitting cap]  Eyes  Reviewed  Mouth  Reviewed  Skin  Reviewed [swelling to bilateral feet/ankles]  Nails  Reviewed [left index finger with dead nail]       Diet Order:  93% x 3 recorded meals Diet Order            Diet Heart Room service appropriate? Yes; Fluid consistency: Thin  Diet effective now              EDUCATION NEEDS:   Education needs have been addressed  Skin:  Skin Assessment: Reviewed RN Assessment(cellulitis; open wound; left big toe)  Last BM:  3/3  Height:   Ht Readings from Last  1 Encounters:  01/09/19 5\' 9"  (1.753 m)    Weight:   Wt Readings from Last 1 Encounters:  01/09/19 84.8 kg    Ideal Body Weight:  72.7 kg  BMI:  27.62  Estimated Nutritional Needs:   Kcal:  2150-2500  Protein:  115-127  Fluid:  >2L   Lajuan Lines, RD, LDN  After Hours/Weekend Pager: [phone]

## 2019-01-11 NOTE — Plan of Care (Signed)

## 2019-01-12 ENCOUNTER — Inpatient Hospital Stay (HOSPITAL_COMMUNITY): Payer: Self-pay | Admitting: Anesthesiology

## 2019-01-12 ENCOUNTER — Encounter (HOSPITAL_COMMUNITY)
Admission: EM | Disposition: A | Discharge status: Home Health | Payer: Self-pay | Source: Ambulatory Visit | Attending: Internal Medicine

## 2019-01-12 ENCOUNTER — Encounter (HOSPITAL_COMMUNITY): Payer: Self-pay | Admitting: Surgery

## 2019-01-12 HISTORY — PX: AMPUTATION: SHX166

## 2019-01-12 LAB — HEPARIN LEVEL (UNFRACTIONATED): Heparin Unfractionated: 0.45 IU/mL (ref 0.30–0.70)

## 2019-01-12 LAB — CBC
HCT: 30 % — ABNORMAL LOW (ref 39.0–52.0)
Hemoglobin: 9.6 g/dL — ABNORMAL LOW (ref 13.0–17.0)
MCH: 26.2 pg (ref 26.0–34.0)
MCHC: 32 g/dL (ref 30.0–36.0)
MCV: 82 fL (ref 80.0–100.0)
Platelets: 174 10*3/uL (ref 150–400)
RBC: 3.66 MIL/uL — ABNORMAL LOW (ref 4.22–5.81)
RDW: 15.6 % — ABNORMAL HIGH (ref 11.5–15.5)
WBC: 3.6 10*3/uL — ABNORMAL LOW (ref 4.0–10.5)
nRBC: 0 % (ref 0.0–0.2)

## 2019-01-12 LAB — SURGICAL PCR SCREEN
MRSA, PCR: NEGATIVE
Staphylococcus aureus: NEGATIVE

## 2019-01-12 LAB — GLUCOSE, CAPILLARY: Glucose-Capillary: 87 mg/dL (ref 70–99)

## 2019-01-12 SURGERY — AMPUTATION DIGIT
Anesthesia: General | Site: Toe | Laterality: Left

## 2019-01-12 MED ORDER — FENTANYL CITRATE (PF) 250 MCG/5ML IJ SOLN
INTRAMUSCULAR | Status: AC
  Filled 2019-01-12: qty 5

## 2019-01-12 MED ORDER — ACETAMINOPHEN 500 MG PO TABS
1000.0000 mg | ORAL_TABLET | Freq: Once | ORAL | Status: AC
  Administered 2019-01-12: 1000 mg via ORAL
  Filled 2019-01-12: qty 2

## 2019-01-12 MED ORDER — METOCLOPRAMIDE HCL 5 MG PO TABS: 5.0000 mg | ORAL_TABLET | Freq: Three times a day (TID) | ORAL | Status: DC | PRN

## 2019-01-12 MED ORDER — OXYCODONE HCL 5 MG PO TABS
5.0000 mg | ORAL_TABLET | ORAL | Status: DC | PRN
  Administered 2019-01-13: 10 mg via ORAL
  Administered 2019-01-14 (×2): 5 mg via ORAL
  Administered 2019-01-16 (×2): 10 mg via ORAL
  Administered 2019-01-17: 5 mg via ORAL
  Administered 2019-01-18: 10 mg via ORAL
  Administered 2019-01-18: 5 mg via ORAL
  Administered 2019-01-18 – 2019-01-19 (×2): 10 mg via ORAL
  Filled 2019-01-12 (×3): qty 2
  Filled 2019-01-12: qty 1
  Filled 2019-01-12 (×5): qty 2
  Filled 2019-01-12: qty 1
  Filled 2019-01-12 (×5): qty 2

## 2019-01-12 MED ORDER — OXYCODONE HCL 5 MG PO TABS
10.0000 mg | ORAL_TABLET | ORAL | Status: DC | PRN
  Administered 2019-01-15: 10 mg via ORAL
  Administered 2019-01-17 – 2019-01-18 (×5): 15 mg via ORAL
  Administered 2019-01-19 – 2019-01-20 (×6): 10 mg via ORAL
  Filled 2019-01-12: qty 2
  Filled 2019-01-12: qty 3
  Filled 2019-01-12: qty 2
  Filled 2019-01-12 (×2): qty 3
  Filled 2019-01-12: qty 2
  Filled 2019-01-12 (×2): qty 3
  Filled 2019-01-12: qty 2

## 2019-01-12 MED ORDER — WARFARIN - PHARMACIST DOSING INPATIENT
Freq: Every day | Status: DC
  Administered 2019-01-13 – 2019-01-17 (×2)

## 2019-01-12 MED ORDER — LIDOCAINE 2% (20 MG/ML) 5 ML SYRINGE
INTRAMUSCULAR | Status: DC | PRN
  Administered 2019-01-12: 60 mg via INTRAVENOUS

## 2019-01-12 MED ORDER — METHOCARBAMOL 500 MG PO TABS: 500.0000 mg | ORAL_TABLET | Freq: Four times a day (QID) | ORAL | Status: DC | PRN

## 2019-01-12 MED ORDER — ACETAMINOPHEN 325 MG PO TABS: 325.0000 mg | ORAL_TABLET | Freq: Four times a day (QID) | ORAL | Status: DC | PRN

## 2019-01-12 MED ORDER — POLYETHYLENE GLYCOL 3350 17 G PO PACK: 17.0000 g | PACK | Freq: Every day | ORAL | Status: DC | PRN

## 2019-01-12 MED ORDER — MIDAZOLAM HCL 2 MG/2ML IJ SOLN
INTRAMUSCULAR | Status: AC
  Filled 2019-01-12: qty 2

## 2019-01-12 MED ORDER — LORAZEPAM 2 MG/ML IJ SOLN
2.0000 mg | Freq: Once | INTRAMUSCULAR | Status: AC
  Administered 2019-01-12: 2 mg via INTRAVENOUS
  Filled 2019-01-12: qty 1

## 2019-01-12 MED ORDER — FENTANYL CITRATE (PF) 250 MCG/5ML IJ SOLN
INTRAMUSCULAR | Status: DC | PRN
  Administered 2019-01-12 (×3): 50 ug via INTRAVENOUS

## 2019-01-12 MED ORDER — METHOCARBAMOL 1000 MG/10ML IJ SOLN: 500.0000 mg | Freq: Four times a day (QID) | INTRAVENOUS | Status: DC | PRN

## 2019-01-12 MED ORDER — METHOCARBAMOL 1000 MG/10ML IJ SOLN
500.0000 mg | Freq: Four times a day (QID) | INTRAVENOUS | Status: DC | PRN
  Filled 2019-01-12: qty 5

## 2019-01-12 MED ORDER — ONDANSETRON HCL 4 MG PO TABS: 4.0000 mg | ORAL_TABLET | Freq: Four times a day (QID) | ORAL | Status: DC | PRN

## 2019-01-12 MED ORDER — METOCLOPRAMIDE HCL 5 MG/ML IJ SOLN: 5.0000 mg | Freq: Three times a day (TID) | INTRAMUSCULAR | Status: DC | PRN

## 2019-01-12 MED ORDER — DEXAMETHASONE SODIUM PHOSPHATE 10 MG/ML IJ SOLN
INTRAMUSCULAR | Status: DC | PRN
  Administered 2019-01-12: 5 mg via INTRAVENOUS

## 2019-01-12 MED ORDER — ONDANSETRON HCL 4 MG/2ML IJ SOLN: 4.0000 mg | Freq: Four times a day (QID) | INTRAMUSCULAR | Status: DC | PRN

## 2019-01-12 MED ORDER — HYDROMORPHONE HCL 1 MG/ML IJ SOLN
0.5000 mg | INTRAMUSCULAR | Status: DC | PRN
  Administered 2019-01-15 – 2019-01-16 (×4): 1 mg via INTRAVENOUS
  Filled 2019-01-12 (×4): qty 1

## 2019-01-12 MED ORDER — METHOCARBAMOL 500 MG PO TABS
500.0000 mg | ORAL_TABLET | Freq: Four times a day (QID) | ORAL | Status: DC | PRN
  Administered 2019-01-13 – 2019-01-20 (×15): 500 mg via ORAL
  Filled 2019-01-12 (×16): qty 1

## 2019-01-12 MED ORDER — HYDROMORPHONE HCL 1 MG/ML IJ SOLN: 0.2500 mg | INTRAMUSCULAR | Status: DC | PRN

## 2019-01-12 MED ORDER — PHENYLEPHRINE 40 MCG/ML (10ML) SYRINGE FOR IV PUSH (FOR BLOOD PRESSURE SUPPORT)
PREFILLED_SYRINGE | INTRAVENOUS | Status: DC | PRN
  Administered 2019-01-12: 80 ug via INTRAVENOUS

## 2019-01-12 MED ORDER — SODIUM CHLORIDE 0.9 % IV SOLN: INTRAVENOUS | Status: DC

## 2019-01-12 MED ORDER — HYDROMORPHONE HCL 1 MG/ML IJ SOLN: 0.5000 mg | INTRAMUSCULAR | Status: DC | PRN

## 2019-01-12 MED ORDER — MIDAZOLAM HCL 2 MG/2ML IJ SOLN
INTRAMUSCULAR | Status: DC | PRN
  Administered 2019-01-12: 2 mg via INTRAVENOUS

## 2019-01-12 MED ORDER — CEFAZOLIN SODIUM-DEXTROSE 2-4 GM/100ML-% IV SOLN: 2.0000 g | INTRAVENOUS | Status: DC

## 2019-01-12 MED ORDER — OXYCODONE HCL 5 MG PO TABS: 10.0000 mg | ORAL_TABLET | ORAL | Status: DC | PRN

## 2019-01-12 MED ORDER — CALCIUM CARBONATE ANTACID 500 MG PO CHEW
1.0000 | CHEWABLE_TABLET | Freq: Three times a day (TID) | ORAL | Status: DC
  Administered 2019-01-12 – 2019-01-21 (×28): 200 mg via ORAL
  Filled 2019-01-12 (×29): qty 1

## 2019-01-12 MED ORDER — MAGNESIUM CITRATE PO SOLN: 1.0000 | Freq: Once | ORAL | Status: DC | PRN

## 2019-01-12 MED ORDER — LACTATED RINGERS IV SOLN
INTRAVENOUS | Status: DC
  Administered 2019-01-12: 09:00:00 via INTRAVENOUS

## 2019-01-12 MED ORDER — DOCUSATE SODIUM 100 MG PO CAPS
100.0000 mg | ORAL_CAPSULE | Freq: Two times a day (BID) | ORAL | Status: DC
  Administered 2019-01-12 – 2019-01-21 (×16): 100 mg via ORAL
  Filled 2019-01-12 (×19): qty 1

## 2019-01-12 MED ORDER — MUPIROCIN 2 % EX OINT
1.0000 "application " | TOPICAL_OINTMENT | Freq: Two times a day (BID) | CUTANEOUS | Status: AC
  Administered 2019-01-12 – 2019-01-16 (×9): 1 via NASAL
  Filled 2019-01-12 (×2): qty 22

## 2019-01-12 MED ORDER — PROPOFOL 10 MG/ML IV BOLUS
INTRAVENOUS | Status: DC | PRN
  Administered 2019-01-12: 120 mg via INTRAVENOUS
  Administered 2019-01-12: 30 mg via INTRAVENOUS

## 2019-01-12 MED ORDER — DOCUSATE SODIUM 100 MG PO CAPS: 100.0000 mg | ORAL_CAPSULE | Freq: Two times a day (BID) | ORAL | Status: DC

## 2019-01-12 MED ORDER — WARFARIN SODIUM 7.5 MG PO TABS
7.5000 mg | ORAL_TABLET | Freq: Once | ORAL | Status: AC
  Administered 2019-01-12: 7.5 mg via ORAL
  Filled 2019-01-12: qty 1

## 2019-01-12 MED ORDER — BISACODYL 10 MG RE SUPP: 10.0000 mg | Freq: Every day | RECTAL | Status: DC | PRN

## 2019-01-12 MED ORDER — OXYCODONE HCL 5 MG PO TABS: 5.0000 mg | ORAL_TABLET | ORAL | Status: DC | PRN

## 2019-01-12 SURGICAL SUPPLY — 31 items
BLADE SURG 21 STRL SS (BLADE) ×2 IMPLANT
BNDG CMPR 9X4 STRL LF SNTH (GAUZE/BANDAGES/DRESSINGS)
BNDG COHESIVE 3X5 TAN STRL LF (GAUZE/BANDAGES/DRESSINGS) ×1 IMPLANT
BNDG COHESIVE 4X5 TAN STRL (GAUZE/BANDAGES/DRESSINGS) ×2 IMPLANT
BNDG ESMARK 4X9 LF (GAUZE/BANDAGES/DRESSINGS) IMPLANT
BNDG GAUZE ELAST 4 BULKY (GAUZE/BANDAGES/DRESSINGS) ×2 IMPLANT
COVER SURGICAL LIGHT HANDLE (MISCELLANEOUS) ×4 IMPLANT
COVER WAND RF STERILE (DRAPES) ×2 IMPLANT
DRAPE U-SHAPE 47X51 STRL (DRAPES) ×2 IMPLANT
DRSG ADAPTIC 3X8 NADH LF (GAUZE/BANDAGES/DRESSINGS) ×1 IMPLANT
DRSG PAD ABDOMINAL 8X10 ST (GAUZE/BANDAGES/DRESSINGS) ×2 IMPLANT
DURAPREP 26ML APPLICATOR (WOUND CARE) ×2 IMPLANT
ELECT REM PT RETURN 9FT ADLT (ELECTROSURGICAL) ×2
ELECTRODE REM PT RTRN 9FT ADLT (ELECTROSURGICAL) ×1 IMPLANT
GAUZE SPONGE 4X4 12PLY STRL (GAUZE/BANDAGES/DRESSINGS) ×1 IMPLANT
GLOVE BIOGEL PI IND STRL 9 (GLOVE) ×1 IMPLANT
GLOVE BIOGEL PI INDICATOR 9 (GLOVE) ×1
GLOVE SURG ORTHO 9.0 STRL STRW (GLOVE) ×2 IMPLANT
GOWN STRL REUS W/ TWL XL LVL3 (GOWN DISPOSABLE) ×2 IMPLANT
GOWN STRL REUS W/TWL XL LVL3 (GOWN DISPOSABLE) ×4
KIT BASIN OR (CUSTOM PROCEDURE TRAY) ×2 IMPLANT
KIT TURNOVER KIT B (KITS) ×2 IMPLANT
MANIFOLD NEPTUNE II (INSTRUMENTS) ×2 IMPLANT
NEEDLE 22X1 1/2 (OR ONLY) (NEEDLE) IMPLANT
NS IRRIG 1000ML POUR BTL (IV SOLUTION) ×2 IMPLANT
PACK ORTHO EXTREMITY (CUSTOM PROCEDURE TRAY) ×2 IMPLANT
PAD ABD 8X10 STRL (GAUZE/BANDAGES/DRESSINGS) ×1 IMPLANT
PAD ARMBOARD 7.5X6 YLW CONV (MISCELLANEOUS) ×4 IMPLANT
SUT ETHILON 2 0 PSLX (SUTURE) ×2 IMPLANT
SYR CONTROL 10ML LL (SYRINGE) IMPLANT
TOWEL OR 17X26 10 PK STRL BLUE (TOWEL DISPOSABLE) ×2 IMPLANT

## 2019-01-12 NOTE — Anesthesia Postprocedure Evaluation (Signed)
Anesthesia Post Note  Patient: Leonard Bentley  Procedure(s) Performed: LEFT GREAT TOE AMPUTATION (Left Toe)     Patient location during evaluation: PACU Anesthesia Type: General Level of consciousness: awake and alert Pain management: pain level controlled Vital Signs Assessment: post-procedure vital signs reviewed and stable Respiratory status: spontaneous breathing, nonlabored ventilation and respiratory function stable Cardiovascular status: blood pressure returned to baseline and stable Postop Assessment: no apparent nausea or vomiting Anesthetic complications: no    Last Vitals:  Vitals:   01/12/19 1027 01/12/19 1043  BP: (!) 115/96 119/85  Pulse: (!) 109 (!) 114  Resp: 15 19  Temp:    SpO2: 98% 100%    Last Pain:  Vitals:   01/12/19 1043  TempSrc:   PainSc: 0-No pain                 Geet Hosking,W. EDMOND

## 2019-01-12 NOTE — Plan of Care (Signed)
Problem: Education: Goal: Knowledge of General Education information will improve Description Including pain rating scale, medication(s)/side effects and non-pharmacologic comfort measures Outcome: Progressing   Problem: Health Behavior/Discharge Planning: Goal: Ability to manage health-related needs will improve Outcome: Progressing   Problem: Clinical Measurements: Goal: Respiratory complications will improve Outcome: Progressing   Problem: Nutrition: Goal: Adequate nutrition will be maintained Outcome: Progressing   Problem: Coping: Goal: Level of anxiety will decrease Outcome: Progressing   Problem: Elimination: Goal: Will not experience complications related to urinary retention Outcome: Progressing   Problem: Pain Managment: Goal: General experience of comfort will improve Outcome: Progressing   Problem: Safety: Goal: Ability to remain free from injury will improve Outcome: Progressing   Problem: Skin Integrity: Goal: Risk for impaired skin integrity will decrease Outcome: Progressing

## 2019-01-12 NOTE — Op Note (Signed)
01/12/2019  3:11 PM  PATIENT:  Leonard Bentley    PRE-OPERATIVE DIAGNOSIS:  Osteomyelitis Left Great Toe  POST-OPERATIVE DIAGNOSIS:  Same  PROCEDURE:  LEFT GREAT TOE AMPUTATION  SURGEON:  [name], MD  PHYSICIAN ASSISTANT:None ANESTHESIA:   General  PREOPERATIVE INDICATIONS:  Leonard Bentley is a  58 y.o. male with a diagnosis of Osteomyelitis Left Great Toe who failed conservative measures and elected for surgical management.    The risks benefits and alternatives were discussed with the patient preoperatively including but not limited to the risks of infection, bleeding, nerve injury, cardiopulmonary complications, the need for revision surgery, among others, and the patient was willing to proceed.  OPERATIVE IMPLANTS: None  @ENCIMAGES @  OPERATIVE FINDINGS: Good petechial bleeding  OPERATIVE PROCEDURE: Patient was brought the operating room underwent a regional anesthetic.  After adequate levels anesthesia obtained patient's left lower extremity was prepped using ChloraPrep draped into a sterile field a timeout was called.  A fishmouth incision was made just distal to the first metatarsal.  The great toe was amputated through the MTP joint.  Electrocautery was used for hemostasis the wound was irrigated with normal saline the incision was closed using 2-0 nylon a sterile dressing was applied patient was taken the PACU in stable condition.   DISCHARGE PLANNING:  Antibiotic duration: Preoperative antibiotics  Weightbearing: Touchdown weightbearing on the left  Pain medication: Opioid pathway  Dressing care/ Wound VAC: Continue dry dressing  Ambulatory devices: Walker or crutches  Discharge to: Home  Follow-up: In the office 1 week post operative.

## 2019-01-12 NOTE — Anesthesia Preprocedure Evaluation (Addendum)
Anesthesia Evaluation  Patient identified by MRN, date of birth, ID band Patient awake    Reviewed: Allergy & Precautions, H&P , NPO status , Patient's Chart, lab work & pertinent test results  Airway Mallampati: II  TM Distance: >3 FB Neck ROM: Full    Dental no notable dental hx. (+) Poor Dentition, Dental Advisory Given   Pulmonary Current Smoker,    Pulmonary exam normal breath sounds clear to auscultation       Cardiovascular hypertension, Pt. on medications + dysrhythmias Atrial Fibrillation  Rhythm:Regular Rate:Normal     Neuro/Psych Anxiety Depression negative neurological ROS     GI/Hepatic negative GI ROS, Neg liver ROS,   Endo/Other  negative endocrine ROS  Renal/GU negative Renal ROS  negative genitourinary   Musculoskeletal  (+) Arthritis , Osteoarthritis,    Abdominal   Peds  Hematology  (+) Blood dyscrasia, anemia ,   Anesthesia Other Findings   Reproductive/Obstetrics negative OB ROS                            Anesthesia Physical Anesthesia Plan  ASA: III  Anesthesia Plan: General   Post-op Pain Management:    Induction: Intravenous  PONV Risk Score and Plan: 2 and Ondansetron, Dexamethasone and Midazolam  Airway Management Planned: LMA  Additional Equipment:   Intra-op Plan:   Post-operative Plan: Extubation in OR  Informed Consent: I have reviewed the patients History and Physical, chart, labs and discussed the procedure including the risks, benefits and alternatives for the proposed anesthesia with the patient or authorized representative who has indicated his/her understanding and acceptance.     Dental advisory given  Plan Discussed with: CRNA  Anesthesia Plan Comments:         Anesthesia Quick Evaluation

## 2019-01-12 NOTE — Anesthesia Procedure Notes (Signed)
Procedure Name: LMA Insertion Date/Time: 01/12/2019 9:52 AM Performed by: Elayne Snare, CRNA Pre-anesthesia Checklist: Patient identified, Emergency Drugs available, Suction available and Patient being monitored Patient Re-evaluated:Patient Re-evaluated prior to induction Oxygen Delivery Method: Circle System Utilized Preoxygenation: Pre-oxygenation with 100% oxygen Induction Type: IV induction Ventilation: Mask ventilation without difficulty LMA: LMA inserted LMA Size: 4.0 Number of attempts: 1 Airway Equipment and Method: Bite block Placement Confirmation: positive ETCO2 Tube secured with: Tape Dental Injury: Teeth and Oropharynx as per pre-operative assessment

## 2019-01-12 NOTE — Progress Notes (Signed)
Orthopedic Tech Progress Note Patient Details:  Leonard Bentley 08-25-1961 [id number]  Ortho Devices Type of Ortho Device: Postop shoe/boot Ortho Device/Splint Location: LLE Ortho Device/Splint Interventions: Ordered, Application   Post Interventions Patient Tolerated: Well Instructions Provided: Adjustment of device, Care of device   Janit Pagan 01/12/2019, 10:48 AM

## 2019-01-12 NOTE — Progress Notes (Signed)
Pre-op report called and given to Horris Latino, RN in Short Stay. All questions answered to satisfaction. OR personnel transported pt.

## 2019-01-12 NOTE — Transfer of Care (Signed)
Immediate Anesthesia Transfer of Care Note  Patient: Leonard Bentley  Procedure(s) Performed: LEFT GREAT TOE AMPUTATION (Left Toe)  Patient Location: PACU  Anesthesia Type:General  Level of Consciousness: awake, alert  and patient cooperative  Airway & Oxygen Therapy: Patient Spontanous Breathing  Post-op Assessment: Report given to RN and Post -op Vital signs reviewed and stable  Post vital signs: Reviewed and stable  Last Vitals:  Vitals Value Taken Time  BP 106/82 01/12/2019 10:13 AM  Temp    Pulse 108 01/12/2019 10:14 AM  Resp 18 01/12/2019 10:14 AM  SpO2 100 % 01/12/2019 10:14 AM  Vitals shown include unvalidated device data.  Last Pain:  Vitals:   01/12/19 0528  TempSrc: Oral  PainSc:       Patients Stated Pain Goal: 3 (50/15/86 8257)  Complications: No apparent anesthesia complications

## 2019-01-12 NOTE — Interval H&P Note (Signed)
History and Physical Interval Note:  01/12/2019 6:42 AM  Leonard Bentley  has presented today for surgery, with the diagnosis of Osteomyelitis Left Great Toe  The various methods of treatment have been discussed with the patient and family. After consideration of risks, benefits and other options for treatment, the patient has consented to  Procedure(s): LEFT GREAT TOE AMPUTATION (Left) as a surgical intervention .  The patient's history has been reviewed, patient examined, no change in status, stable for surgery.  I have reviewed the patient's chart and labs.  Questions were answered to the patient's satisfaction.     [name]

## 2019-01-12 NOTE — Progress Notes (Signed)
[place] for warfarin Indication: hx recurrent PE  Allergies  Allergen Reactions  . Other Other (See Comments)    "Cactus" blisters on tongue    Patient Measurements: Height: 5\' 9"  (175.3 cm) Weight: 187 lb (84.8 kg) IBW/kg (Calculated) : 70.7  Vital Signs: Temp: 97.7 F (36.5 C) (03/06 1121) Temp Source: Oral (03/06 1121) BP: 102/87 (03/06 1121) Pulse Rate: 75 (03/06 1121)  Labs: Recent Labs    01/09/19 1807 01/10/19 0123  01/10/19 1549 01/11/19 0254 01/12/19 0156  HGB 11.3* 10.4*  --   --   --  9.6*  HCT 34.7* 32.2*  --   --   --  30.0*  PLT 251 217  --   --   --  174  APTT  --  31  --   --   --   --   LABPROT  --  18.0*  --   --   --   --   INR  --  1.5*  --   --   --   --   HEPARINUNFRC  --   --    < > 0.57 0.64 0.45  CREATININE 0.85 0.96  --   --   --   --    < > = values in this interval not displayed.    Estimated Creatinine Clearance: 82.9 mL/min (by C-G formula based on SCr of 0.96 mg/dL).   Assessment: 42 YOM with h/o recurrent PE who presented to [facility] with osteomyelitis of left big toe. Patient was on warfarin at home which was discontinued for surgery. He is now s/p left great toe amputation. Pharmacy consulted to resume warfarin; not resuming heparin drip post-op per discussion with Dr. Wyonia Hough. No overt bleeding noted, Hgb 9.6 this am, platelets are normal. INR was subtherapeutic at 1.5 on 3/4.  PTA: 5 mg daily  Goal of Therapy:  INR 2-3 Monitor platelets by anticoagulation protocol: Yes   Plan:  Warfarin 7.5 mg PO tonight Daily INR Monitor for s/sx of bleeding   Renold Genta, PharmD, BCPS Clinical Pharmacist Clinical phone for 01/12/2019 until 10p is [phone] 01/12/2019 3:41 PM  **Pharmacist phone directory can now be found on [email or web address] listed under [place]**

## 2019-01-12 NOTE — Progress Notes (Signed)
Pt returned to room 5N29 after surgery. Received report from CJ, RN in PACU. See assessment. Will continue to monitor. 

## 2019-01-12 NOTE — Progress Notes (Signed)
OT Cancellation Note  Patient Details Name: Leonard Bentley MRN: [id number] DOB: 1961/09/17   Cancelled Treatment:    Reason Eval/Treat Not Completed: Other (comment)(hold OT eval until after surg) Pt could benefit most from OT evaluation s/p surg.    Jeri Modena, OTR/L  Acute Rehabilitation Services Pager: [phone] Office: [phone] .   Jeri Modena 01/12/2019, 8:00 AM

## 2019-01-12 NOTE — Progress Notes (Signed)
PROGRESS NOTE    Leonard Bentley  IRS:[id number] DOB: 02/15/1961 DOA: 01/09/2019 PCP: [name], MD   Brief Narrative:  Per admitting physician: [name] a 58 y.o.malewith medical history significant ofrecurrent pulmonary embolism, hypertension, alcoholism, tobacco abuse, peripheral vascular disease, non-Hodgkin's lymphoma who was sent over to the ER by [facility] due to osteomyelitis of the left big toe. He was being followed for nonhealing wound in the area. He was on trial of of antibiotics treatment which has not decreased his risk. He was last on Augmentin. He has been quite having continued pain pain is 8 out of 10 with medications not helping. He was having some mild tachycardia also when he visited the orthopedics office so he was sent over to the ER for evaluation. Patient noted to have osteomyelitis with open wound. He has been admitted to [facility] for schedule amputation of the left big toe this Friday..  ED Course:Temperature 98.1 blood pressure is 129/103 pulse 132 respirate 20 oxygen sat 97% on room air. White count 3.9 hemoglobin 11.3 and platelets 251. Chemistry otherwise is within normal lactic acid 1.5. PT 19 point INR 1.63. X-ray of the left foot showed acute focal decreased density involving the lateral aspect of the great toe consistent with osteomyelitis previously seen on MRI.   Assessment & Plan:   Principal Problem:   Osteomyelitis of great toe of left foot (HCC) Active Problems:   History of non-Hodgkin's lymphoma   Alcohol abuse   Cigarette smoker   SVT (supraventricular tachycardia) (HCC)   Recurrent pulmonary emboli (HCC)   HTN (hypertension)   #1 osteomyelitis of the left big toe now postop Konz 0:Patient has failed outpatient antibiotic treatment.He will remain inpatient, continue onIV vancomycin and cefepime. Blood cultures have been obtainedand are negative,. Orthopedics on consultation with surgery today March  6.  #2 history of pulmonary embolism: Currently on Coumadin and subtherapeutic. With surgery comingadmitting physiciandiscontinuedwarfarin. InitiatedIV heparin on admission, discussed with pharmacy and am stopping heparin tonight at midnight in anticipation of surgery in the morning, will discuss with surgery when they feel its okay to restart anticoagulation.  #3 alcohol abuse:Patient denied taking alcohol PTA although he has had recent DTs. We willcontinue withCIWA protocol empirically. In the meantime continue monitoring.  #4 tobacco abuse:Nicotine patch will becontinuedwith tobacco cessation counseling.  #5 hypertension:Continue blood pressure control.  #6 supraventricular tachycardia:Most likely secondary to alcohol withdrawals. Patient is currently stable. He has chronic SVT.  DVT prophylaxis: SCD/Compression stockings  Code Status: full    Code Status Orders  (From admission, onward)         Start     Ordered   01/09/19 2321  Full code  Continuous     01/09/19 2320        Code Status History    Date Active Date Inactive Code Status Order ID Comments User Context   12/27/2018 1619 12/31/2018 1428 Full Code [id number]  Oswald Hillock, MD ED   12/07/2018 2253 12/10/2018 1723 Full Code [id number]  [name], MD ED   11/27/2018 0528 11/28/2018 1548 Full Code [id number]  [name], MD ED   11/02/2018 0135 11/08/2018 1511 Full Code [id number]  [name], MD ED   11/02/2018 0053 11/02/2018 0135 Full Code [id number]  [name], MD ED   08/30/2018 2305 09/01/2018 0042 Full Code [id number]  [name], MD Inpatient   08/30/2018 1620 08/30/2018 2305 Full Code [id number]  [name], PA-C ED   07/25/2018  1557 07/30/2018 1714 Full Code [id number]  [name], MD ED   06/04/2018 2302 06/13/2018 0204 Full Code [id number]  [name], MD Inpatient   11/27/2016 2247 11/28/2016 1715 Full Code [id number]  [name], MD Inpatient     10/27/2016 1420 10/31/2016 1519 Full Code [id number]  [name], MD Inpatient   08/13/2016 0855 08/13/2016 1559 Full Code [id number]  [name], MD Inpatient   04/02/2016 1736 04/03/2016 1820 Full Code [id number]  [name], NP Inpatient   01/31/2013 0615 02/02/2013 1800 Full Code [id number]  [name], MD Inpatient   01/30/2013 2133 01/31/2013 0615 Full Code [id number]  [name], MD ED   12/28/2012 1235 01/01/2013 1336 Full Code [id number]  [name], MD ED   08/24/2012 0423 08/25/2012 1426 Full Code [id number]  [name]., DO ED     Family Communication: none present  Disposition Plan:   remain inpt for postop surgical management with iv pain control Consults called: none today Admission status: Inpatient   Consultants:   orthopedics  Procedures:  Dg Hand 2 View Left  Result Date: 12/28/2018 CLINICAL DATA:  Distal left index finger pain following a crush injury 3 weeks ago. EXAM: LEFT HAND - 2 VIEW COMPARISON:  None. FINDINGS: There is no evidence of fracture or dislocation. There is no evidence of arthropathy or other focal bone abnormality. Soft tissues are unremarkable. IMPRESSION: Negative. Electronically Signed   By: Claudie Revering M.D.   On: 12/28/2018 12:44   Mr Foot Left [name]  Result Date: 12/27/2018 CLINICAL DATA:  Worsening infection of the left great toe despite antibiotics. EXAM: MRI OF THE LEFT FOREFOOT WITHOUT AND WITH CONTRAST TECHNIQUE: Multiplanar, multisequence MR imaging of the left foot was performed both before and after administration of intravenous contrast. CONTRAST:  8 mm mild Gadavist IV COMPARISON:  Left foot radiographs 12/07/2018 and great toe radiographs 12/27/2018 FINDINGS: Bones/Joint/Cartilage Subtle cortical bone loss along the dorsal medial aspect of the distal phalanx of the left great toe, series 4/11. Marrow edema of the distal phalanx concerning for early changes of osteomyelitis are noted, series  8/15. No acute fracture joint dislocations. Subcortical cyst involving the lateral cuneiform is identified measuring approximately 8 x 5 x 6 mm. Ligaments Negative Muscles and Tendons No evidence of pyomyositis. Mild intramuscular edema likely neurogenic. Soft tissues Small ganglion cyst along the dorsum of the first proximal phalanx measuring approximately 11 x 6 x 9 mm. Diffuse soft tissue edema is noted consistent with cellulitis. IMPRESSION: 1. Diffuse subcutaneous soft tissue edema the forefoot consistent with history of cellulitis. 2. Subtle cortical bone loss along the dorsal medial aspect of the distal phalanx of the great toe more uniform marrow edema of the distal phalanx raising concern for changes of osteomyelitis. 3. Small ganglion cyst along the dorsum the first proximal phalanx measuring 11 x 6 x 9 mm. 4. Degenerative subcortical cyst of the lateral cuneiform measuring 8 x 5 x 6 mm. Electronically Signed   By: Ashley Royalty M.D.   On: 12/27/2018 21:28   Dg Foot Complete Left  Result Date: 01/09/2019 CLINICAL DATA:  Great toe wound for 4 weeks. Pain and infection. EXAM: LEFT FOOT - COMPLETE 3+ VIEW COMPARISON:  Great toe radiographs and forefoot MRI 12/27/2018 FINDINGS: No bony destructive change. Equivocal decreased density involving the lateral aspect of the great toe distal tuft at site of osteomyelitis on prior MRI. No periosteal reaction or frank osseous erosions. No soft tissue  air or radiopaque foreign body. Soft tissue thickening of the digit. Hammertoe deformity of the second through fifth digits. No findings elsewhere to suggest osteomyelitis. Well corticated density about the dorsal talus may represent sequela of remote prior injury or accessory ossicle. IMPRESSION: Equivocal decreased density involving the lateral aspect of the great toe distal tuft at site of osteomyelitis on prior MRI. No frank bony destructive change. No soft tissue air or radiopaque foreign body. Electronically Signed    By: Keith Rake M.D.   On: 01/09/2019 19:07   Dg Toe Great Left  Result Date: 12/27/2018 CLINICAL DATA:  necrotic great toe, please eval for possible osteomyelitis/gasPatient reports pain in left great toe for 3 weeks. Patient states it started as blister along dorsal surface of great toe 3 weeks ago, he placed a band-aid on it and 2 weeks ago his distal great toe started to turn black. Pain is localized to great toe and does not radiate into rest of foot. No prior surgery or injury to great toe. EXAM: LEFT GREAT TOE COMPARISON:  12/07/2018 FINDINGS: There is no evidence of fracture or dislocation. No focal cortical erosion to suggest osteomyelitis. There is no evidence of arthropathy or other focal bone abnormality. Soft tissue swelling about the distal phalanx. No subcutaneous gas or radiodense foreign body. IMPRESSION: 1. Soft tissue swelling without bony abnormality. 2. No radiographic evidence of osteomyelitis. The Electronically Signed   By: Lucrezia Europe M.D.   On: 12/27/2018 14:55     Antimicrobials:   vanc and cefepime Rachal 2   Subjective: Patient now postoperative left great toe amputation.  Reports pain is well controlled.  Objective: Vitals:   01/12/19 1027 01/12/19 1043 01/12/19 1112 01/12/19 1121  BP: (!) 115/96 119/85 (!) 140/125 102/87  Pulse: (!) 109 (!) 114 (!) 176 75  Resp: 15 19  18   Temp:   (!) 97.5 F (36.4 C) 97.7 F (36.5 C)  TempSrc:   Oral Oral  SpO2: 98% 100% 97% 98%  Weight:      Height:        Intake/Output Summary (Last 24 hours) at 01/12/2019 1351 Last data filed at 01/12/2019 1257 Gross per 24 hour  Intake 1761.4 ml  Output 1335 ml  Net 426.4 ml   Filed Weights   01/11/19 1637  Weight: 84.8 kg    Examination:  General exam: Appears calm and comfortable  Respiratory system: Clear to auscultation. Respiratory effort normal. Cardiovascular system: S1 & S2 heard, RRR. No JVD, murmurs, rubs, gallops or clicks. No pedal edema. Gastrointestinal  system: Abdomen is nondistended, soft and nontender. No organomegaly or masses felt. Normal bowel sounds heard. Central nervous system: Alert and oriented. No focal neurological deficits. Extremities: Symmetric 5 x 5 power left great toe amputation in bandage, no bleed thru Skin: No rashes, lesions or ulcers Psychiatry: Judgement and insight appear normal. Mood & affect appropriate.     Data Reviewed: I have personally reviewed following labs and imaging studies  CBC: Recent Labs  Lab 01/09/19 1807 01/10/19 0123 01/12/19 0156  WBC 3.9* 3.0* 3.6*  NEUTROABS 2.5  --   --   HGB 11.3* 10.4* 9.6*  HCT 34.7* 32.2* 30.0*  MCV 81.3 79.5* 82.0  PLT 251 217 989   Basic Metabolic Panel: Recent Labs  Lab 01/09/19 1807 01/10/19 0123  NA 138 136  K 4.3 4.2  CL 103 104  CO2 26 25  GLUCOSE 98 103*  BUN 14 12  CREATININE 0.85 0.96  CALCIUM 9.3  8.8*   GFR: Estimated Creatinine Clearance: 82.9 mL/min (by C-G formula based on SCr of 0.96 mg/dL). Liver Function Tests: Recent Labs  Lab 01/10/19 0123  AST 38  ALT 25  ALKPHOS 41  BILITOT 1.1  PROT 5.9*  ALBUMIN 3.2*   No results for input(s): LIPASE, AMYLASE in the last 168 hours. No results for input(s): AMMONIA in the last 168 hours. Coagulation Profile: Recent Labs  Lab 01/10/19 0123  INR 1.5*   Cardiac Enzymes: No results for input(s): CKTOTAL, CKMB, CKMBINDEX, TROPONINI in the last 168 hours. BNP (last 3 results) No results for input(s): PROBNP in the last 8760 hours. HbA1C: No results for input(s): HGBA1C in the last 72 hours. CBG: Recent Labs  Lab 01/12/19 1014  GLUCAP 87   Lipid Profile: No results for input(s): CHOL, HDL, LDLCALC, TRIG, CHOLHDL, LDLDIRECT in the last 72 hours. Thyroid Function Tests: No results for input(s): TSH, T4TOTAL, FREET4, T3FREE, THYROIDAB in the last 72 hours. Anemia Panel: No results for input(s): VITAMINB12, FOLATE, FERRITIN, TIBC, IRON, RETICCTPCT in the last 72 hours. Sepsis  Labs: Recent Labs  Lab 01/09/19 1827  LATICACIDVEN 1.4    Recent Results (from the past 240 hour(s))  Culture, blood (routine x 2)     Status: None (Preliminary result)   Collection Time: 01/09/19  6:27 PM  Result Value Ref Range Status   Specimen Description   Final    BLOOD LEFT ANTECUBITAL Performed at [facility] [address] [place], [place] [address]    Special Requests   Final    BOTTLES DRAWN AEROBIC AND ANAEROBIC Blood Culture adequate volume Performed at [facility] [address], [place], [place] [address]    Culture   Final    NO GROWTH 3 DAYS Performed at [facility], [place] [address], [place], [place] [address]    Report Status PENDING  Incomplete  Culture, blood (routine x 2)     Status: None (Preliminary result)   Collection Time: 01/09/19  6:50 PM  Result Value Ref Range Status   Specimen Description   Final    BLOOD LEFT ANTECUBITAL Performed at [facility] [address] [place], [place] [address]    Special Requests   Final    BOTTLES DRAWN AEROBIC AND ANAEROBIC Blood Culture adequate volume Performed at [facility] [address], [place], [place] [address]    Culture   Final    NO GROWTH 3 DAYS Performed at [facility], [place] [address], [place], [place] [address]    Report Status PENDING  Incomplete  Surgical PCR screen     Status: None   Collection Time: 01/12/19  7:25 AM  Result Value Ref Range Status   MRSA, PCR NEGATIVE NEGATIVE Final   Staphylococcus aureus NEGATIVE NEGATIVE Final    Comment: (NOTE) The Xpert SA Assay (FDA approved for NASAL specimens in patients 19 years of age and older), is one component of a comprehensive surveillance program. It is not intended to diagnose infection nor to guide or monitor treatment. Performed at [facility], [place] [address], [place], [place] [address]          Radiology Studies: No  results found.      Scheduled Meds: . calcium carbonate  1 tablet Oral TID  . docusate sodium  100 mg Oral BID  . feeding supplement (ENSURE ENLIVE)  237 mL Oral BID BM  . feeding supplement (PRO-STAT  SUGAR FREE 64)  30 mL Oral BID  . LORazepam  0-4 mg Intravenous Q12H   Or  . LORazepam  0-4 mg Oral Q12H  . mupirocin ointment  1 application Nasal BID  . nicotine  21 mg Transdermal Daily  . thiamine  100 mg Oral Daily   Or  . thiamine  100 mg Intravenous Daily   Continuous Infusions: . sodium chloride 75 mL/hr at 01/11/19 1718  . sodium chloride 10 mL/hr at 01/12/19 1125  . ceFEPime (MAXIPIME) IV 2 g (01/12/19 1332)  . lactated ringers 10 mL/hr at 01/12/19 0838  . methocarbamol (ROBAXIN) IV    . vancomycin 1,250 mg (01/12/19 1130)     LOS: 3 days    Time spent: 35 min    Nicolette Bang, MD [facility]  If 7PM-7AM, please contact night-coverage  01/12/2019, 1:51 PM

## 2019-01-13 ENCOUNTER — Encounter (HOSPITAL_COMMUNITY): Payer: Self-pay | Admitting: Orthopedic Surgery

## 2019-01-13 LAB — CBC
HCT: 30 % — ABNORMAL LOW (ref 39.0–52.0)
Hemoglobin: 9.3 g/dL — ABNORMAL LOW (ref 13.0–17.0)
MCH: 25.4 pg — ABNORMAL LOW (ref 26.0–34.0)
MCHC: 31 g/dL (ref 30.0–36.0)
MCV: 82 fL (ref 80.0–100.0)
Platelets: 175 10*3/uL (ref 150–400)
RBC: 3.66 MIL/uL — ABNORMAL LOW (ref 4.22–5.81)
RDW: 15.4 % (ref 11.5–15.5)
WBC: 4.7 10*3/uL (ref 4.0–10.5)
nRBC: 0.6 % — ABNORMAL HIGH (ref 0.0–0.2)

## 2019-01-13 LAB — PROTIME-INR
INR: 1.1 (ref 0.8–1.2)
Prothrombin Time: 13.6 seconds (ref 11.4–15.2)

## 2019-01-13 LAB — VANCOMYCIN, TROUGH: Vancomycin Tr: 14 ug/mL — ABNORMAL LOW (ref 15–20)

## 2019-01-13 LAB — HEPARIN LEVEL (UNFRACTIONATED)
Heparin Unfractionated: <0.1 IU/mL — ABNORMAL LOW (ref 0.30–0.70)
Heparin Unfractionated: 0.25 IU/mL — ABNORMAL LOW (ref 0.30–0.70)

## 2019-01-13 LAB — VANCOMYCIN, PEAK: Vancomycin Pk: 32 ug/mL (ref 30–40)

## 2019-01-13 MED ORDER — ALUM & MAG HYDROXIDE-SIMETH 200-200-20 MG/5ML PO SUSP
30.0000 mL | Freq: Four times a day (QID) | ORAL | Status: DC | PRN
  Administered 2019-01-13: 30 mL via ORAL
  Filled 2019-01-13: qty 30

## 2019-01-13 MED ORDER — HEPARIN (PORCINE) 25000 UT/250ML-% IV SOLN
1750.0000 [IU]/h | INTRAVENOUS | Status: DC
  Administered 2019-01-13: 1450 [IU]/h via INTRAVENOUS
  Administered 2019-01-14 – 2019-01-20 (×10): 1600 [IU]/h via INTRAVENOUS
  Filled 2019-01-13 (×14): qty 250

## 2019-01-13 MED ORDER — WARFARIN SODIUM 7.5 MG PO TABS
7.5000 mg | ORAL_TABLET | Freq: Once | ORAL | Status: AC
  Administered 2019-01-13: 7.5 mg via ORAL
  Filled 2019-01-13: qty 1

## 2019-01-13 NOTE — Evaluation (Signed)
Physical Therapy Evaluation Patient Details Name: Leonard Bentley MRN: [id number] DOB: 30-Sep-1961 Today's Date: 01/13/2019   History of Present Illness  Pt is 58 yo male who presents for L great toe amp due to osteomyelitis. PMH: EtOH abuse, HTN, Non Hodgkins, PE, Tachycardia, MVR.   Clinical Impression  Pt admitted with above diagnosis. Pt currently with functional limitations due to the deficits listed below (see PT Problem List). Pt had difficulty comprehending and maintaining TDWB status LLE, especially on stairs. Recommend use of RW to assist him in keeping wt off L foot, especially since he has neuropathic pain R foot. Pt supervision level for mobility will put him on caseload to reinforce WB status and safety.  Pt will benefit from skilled PT to increase their independence and safety with mobility to allow discharge to the venue listed below.       Follow Up Recommendations No PT follow up    Equipment Recommendations  Rolling walker with 5" wheels    Recommendations for Other Services       Precautions / Restrictions Precautions Precautions: None Required Braces or Orthoses: Other Brace Other Brace: has post op shoe for both feet Restrictions Weight Bearing Restrictions: Yes LLE Weight Bearing: Touchdown weight bearing Other Position/Activity Restrictions: pt seeming to have a hard time understanding this, especially in regards to how it applies to function      Mobility  Bed Mobility Overal bed mobility: Independent                Transfers Overall transfer level: Modified independent Equipment used: None;Rolling walker (2 wheeled)             General transfer comment: no difficulties with sit<>stand but pt putting full wt on LLE, instructed to use RW to keep wt more on R and wt only in L heel  Ambulation/Gait Ambulation/Gait assistance: Supervision Gait Distance (Feet): 100 Feet Assistive device: Rolling walker (2 wheeled) Gait Pattern/deviations:  Decreased step length - right Gait velocity: decreased Gait velocity interpretation: 1.31 - 2.62 ft/sec, indicative of limited community ambulator General Gait Details: instructed to decreased R step length to not transfer wt to L forefoot  Stairs Stairs: Yes Stairs assistance: Min guard Stair Management: One rail Left;Step to pattern;Forwards Number of Stairs: 5 General stair comments: had to practice stairs 3x to help pt understand sequencing and step to pattern to keep TDWB status  Wheelchair Mobility    Modified Rankin (Stroke Patients Only)       Balance Overall balance assessment: No apparent balance deficits (not formally assessed)                                           Pertinent Vitals/Pain Pain Assessment: Faces Faces Pain Scale: Hurts even more Pain Location: L foot, also reports neuropathic pain R foot Pain Descriptors / Indicators: Burning;Aching Pain Intervention(s): Limited activity within patient's tolerance;Monitored during session    Home Living Family/patient expects to be discharged to:: Private residence Living Arrangements: Alone Available Help at Discharge: Family;Available PRN/intermittently Type of Home: House Home Access: Stairs to enter Entrance Stairs-Rails: Left Entrance Stairs-Number of Steps: 3 Home Layout: One level Home Equipment: None Additional Comments: pt does not seem to have much support, says that getting groceries will be difficult    Prior Function Level of Independence: Independent         Comments: increased time for  tasks due to tremors in hands     Hand Dominance   Dominant Hand: Right    Extremity/Trunk Assessment   Upper Extremity Assessment Upper Extremity Assessment: Defer to OT evaluation    Lower Extremity Assessment Lower Extremity Assessment: Overall WFL for tasks assessed    Cervical / Trunk Assessment Cervical / Trunk Assessment: Normal  Communication   Communication: No  difficulties  Cognition Arousal/Alertness: Awake/alert Behavior During Therapy: WFL for tasks assessed/performed Overall Cognitive Status: No family/caregiver present to determine baseline cognitive functioning                                 General Comments: pt appropriate but seems to process slowly and had difficulty maintaining WB as well as sequencing stairs      General Comments General comments (skin integrity, edema, etc.): instructed pt in elevation of LLE, again, pt needed frequent reinforcement to understand the importance of elevating LLE    Exercises     Assessment/Plan    PT Assessment Patient needs continued PT services  PT Problem List Decreased mobility;Pain;Decreased knowledge of use of DME;Decreased knowledge of precautions       PT Treatment Interventions DME instruction;Gait training;Stair training;Functional mobility training;Therapeutic activities;Therapeutic exercise;Patient/family education;Cognitive remediation    PT Goals (Current goals can be found in the Care Plan section)  Acute Rehab PT Goals Patient Stated Goal: get back to martial arts PT Goal Formulation: With patient Time For Goal Achievement: 01/20/19 Potential to Achieve Goals: Good    Frequency Min 3X/week   Barriers to discharge Decreased caregiver support      Co-evaluation               AM-PAC PT "6 Clicks" Mobility  Outcome Measure Help needed turning from your back to your side while in a flat bed without using bedrails?: None Help needed moving from lying on your back to sitting on the side of a flat bed without using bedrails?: None Help needed moving to and from a bed to a chair (including a wheelchair)?: None Help needed standing up from a chair using your arms (e.g., wheelchair or bedside chair)?: None Help needed to walk in hospital room?: A Little Help needed climbing 3-5 steps with a railing? : A Little 6 Click Score: 22    End of Session  Equipment Utilized During Treatment: Gait belt Activity Tolerance: Patient tolerated treatment well Patient left: in bed;with call bell/phone within reach Nurse Communication: Mobility status PT Visit Diagnosis: Pain;Difficulty in walking, not elsewhere classified (R26.2) Pain - Right/Left: Left Pain - part of body: Ankle and joints of foot    Time: 0812-0839 PT Time Calculation (min) (ACUTE ONLY): 27 min   Charges:   PT Evaluation $PT Eval Moderate Complexity: 1 Mod PT Treatments $Gait Training: 8-22 mins        Leighton Roach, [place]  Pager [phone] Office [place] 01/13/2019, 8:56 AM

## 2019-01-13 NOTE — Evaluation (Signed)
Occupational Therapy Evaluation Patient Details Name: Leonard Bentley MRN: [id number] DOB: [birth year] Today's Date: 01/13/2019    History of Present Illness Pt is 58 yo male who presents for L great toe amp due to osteomyelitis. PMH: EtOH abuse, HTN, Non Hodgkins, PE, Tachycardia, MVR.    Clinical Impression   Pt is typically independent and lives alone. Presents with impaired cognition, pain in feet L>R and difficulty maintaining TDWB on R foot. Will follow acutely.    Follow Up Recommendations  No OT follow up    Equipment Recommendations  Tub/shower bench    Recommendations for Other Services       Precautions / Restrictions Precautions Precautions: None Required Braces or Orthoses: Other Brace Other Brace: has post op shoe for both feet Restrictions Weight Bearing Restrictions: Yes LLE Weight Bearing: Touchdown weight bearing Other Position/Activity Restrictions: pt seeming to have a hard time understanding this, especially in regards to how it applies to function      Mobility Bed Mobility Overal bed mobility: Independent                Transfers Overall transfer level: Modified independent Equipment used: Rolling walker (2 wheeled)             General transfer comment: pt needing cues for WB status, technique with RW    Balance Overall balance assessment: No apparent balance deficits (not formally assessed)                                         ADL either performed or assessed with clinical judgement   ADL Overall ADL's : Modified independent                                       General ADL Comments: Educated in availability of tub transfer bench and how to transport items safely with RW. Recommended grocery delivery or ordering for family to pick up.     Vision Patient Visual Report: No change from baseline       Perception     Praxis      Pertinent Vitals/Pain Pain Assessment: Faces Faces Pain Scale:  Hurts even more Pain Location: both feet Pain Descriptors / Indicators: Burning;Aching Pain Intervention(s): Monitored during session;Repositioned     Hand Dominance Right   Extremity/Trunk Assessment Upper Extremity Assessment Upper Extremity Assessment: Overall WFL for tasks assessed   Lower Extremity Assessment Lower Extremity Assessment: Defer to PT evaluation   Cervical / Trunk Assessment Cervical / Trunk Assessment: Normal   Communication Communication Communication: No difficulties   Cognition Arousal/Alertness: Awake/alert Behavior During Therapy: WFL for tasks assessed/performed Overall Cognitive Status: No family/caregiver present to determine baseline cognitive functioning                                 General Comments: pt with slow processing and difficulty recalling WB status   General Comments  instructed pt in elevation of LLE, again, pt needed frequent reinforcement to understand the importance of elevating LLE    Exercises     Shoulder Instructions      Home Living Family/patient expects to be discharged to:: Private residence Living Arrangements: Alone Available Help at Discharge: Family;Available PRN/intermittently Type of Home: House Home Access: Stairs  to enter Entrance Stairs-Number of Steps: 3 Entrance Stairs-Rails: Left Home Layout: One level     Bathroom Shower/Tub: Teacher, early years/pre: Standard     Home Equipment: Cane - single point   Additional Comments: pt does not seem to have much support, says that getting groceries will be difficult      Prior Functioning/Environment Level of Independence: Independent with assistive device(s)        Comments: walks with cane        OT Problem List: Pain;Decreased knowledge of precautions;Decreased knowledge of use of DME or AE      OT Treatment/Interventions: DME and/or AE instruction    OT Goals(Current goals can be found in the care plan section)  Acute Rehab OT Goals Patient Stated Goal: get back to martial arts OT Goal Formulation: With patient Time For Goal Achievement: 01/20/19 Potential to Achieve Goals: Good ADL Goals Pt Will Perform Tub/Shower Transfer: with modified independence;tub bench;rolling walker;ambulating Additional ADL Goal #1: Pt will adhere to TDWB on L foot as he gathers items necessary for ADL around his room with RW.  OT Frequency: Min 2X/week   Barriers to D/C: Decreased caregiver support          Co-evaluation              AM-PAC OT "6 Clicks" Daily Activity     Outcome Measure Help from another person eating meals?: None Help from another person taking care of personal grooming?: None Help from another person toileting, which includes using toliet, bedpan, or urinal?: None Help from another person bathing (including washing, rinsing, drying)?: None Help from another person to put on and taking off regular upper body clothing?: None Help from another person to put on and taking off regular lower body clothing?: None 6 Click Score: 24   End of Session    Activity Tolerance: Patient tolerated treatment well Patient left: in bed;with call bell/phone within reach  OT Visit Diagnosis: Pain;Other abnormalities of gait and mobility (R26.89)                Time: 6606-3016 OT Time Calculation (min): 21 min Charges:  OT General Charges $OT Visit: 1 Visit OT Evaluation $OT Eval Moderate Complexity: 1 Mod  Malka So 01/13/2019, 9:29 AM  Nestor Lewandowsky, OTR/L Acute Rehabilitation Services Pager: [phone] Office: [phone]

## 2019-01-13 NOTE — Progress Notes (Signed)
Patients blood pressure has been running high. On call MD paged. Waiting to see if any actions should be taken. Patient is not complaining of any headache or dizziness. Will continue to monitor.

## 2019-01-13 NOTE — Progress Notes (Signed)
PROGRESS NOTE    Leonard Bentley  YPP:[id number] DOB: [birth year] DOA: 01/09/2019 PCP: [name], MD   Brief Narrative:  Per admitting physician: [name] a 58 y.o.malewith medical history significant ofrecurrent pulmonary embolism, hypertension, alcoholism, tobacco abuse, peripheral vascular disease, non-Hodgkin's lymphoma who was sent over to the ER by [facility] due to osteomyelitis of the left big toe. He was being followed for nonhealing wound in the area. He was on trial of of antibiotics treatment which has not decreased his risk. He was last on Augmentin. He has been quite having continued pain pain is 8 out of 10 with medications not helping. He was having some mild tachycardia also when he visited the orthopedics office so he was sent over to the ER for evaluation. Patient noted to have osteomyelitis with open wound. He has been admitted to [facility] for schedule amputation of the left big toe this Friday..  ED Course:Temperature 98.1 blood pressure is 129/103 pulse 132 respirate 20 oxygen sat 97% on room air. White count 3.9 hemoglobin 11.3 and platelets 251. Chemistry otherwise is within normal lactic acid 1.5. PT 19 point INR 1.63. X-ray of the left foot showed acute focal decreased density involving the lateral aspect of the great toe consistent with osteomyelitis previously seen on MRI.   Assessment & Plan:   Principal Problem:   Osteomyelitis of great toe of left foot (HCC) Active Problems:   History of non-Hodgkin's lymphoma   Alcohol abuse   Cigarette smoker   SVT (supraventricular tachycardia) (HCC)   Recurrent pulmonary emboli (HCC)   HTN (hypertension)   #1 osteomyelitis of the left big toe now postop [place] 1:Patient has failed outpatient antibiotic treatment.He will remain inpatient,Blood cultures have been obtainedandare negative,. Orthopedics on consultation with surgery March 6.  With subsequent left big toe amputation.  No  intraoperative complications, cleared to restart anticoagulation.  #2 history of pulmonary embolism: Preoperatively patient was on Coumadin and subtherapeutic. With surgery comingadmitting physiciandiscontinuedwarfarin. InitiatedIV heparinon admission, heparin was stopped for surgery.  Because of history of recurrent PEs bilateral and multiple patient will be restarted on Coumadin with bridge.  Patient reported inability to afford additional long-term anticoagulants so we will continue with Coumadin.  #3 alcohol abuse:Patient denied taking alcohol PTA although he has had recent DTs. We willcontinue withCIWA protocol empirically. In the meantime continue monitoring, to this point no evidence of withdrawals  #4 tobacco abuse:Nicotine patch will becontinuedwith tobacco cessation counseling.  #5 hypertension:Continue blood pressure control.  #6 supraventricular tachycardia:Most likely secondary to alcohol withdrawals. Patient is currently stable. He has chronic SVT.  DVT prophylaxis: [id number]  Code Status: Full code    Code Status Orders  (From admission, onward)         Start     Ordered   01/09/19 2321  Full code  Continuous     01/09/19 2320        Code Status History    Date Active Date Inactive Code Status Order ID Comments User Context   12/27/2018 1619 12/31/2018 1428 Full Code [id number]  Oswald Hillock, MD ED   12/07/2018 2253 12/10/2018 1723 Full Code [id number]  [name], MD ED   11/27/2018 0528 11/28/2018 1548 Full Code [id number]  [name], MD ED   11/02/2018 0135 11/08/2018 1511 Full Code [id number]  [name], MD ED   11/02/2018 0053 11/02/2018 0135 Full Code [id number]  [name], MD ED   08/30/2018 2305 09/01/2018 0042 Full Code  [id number]  [name], MD Inpatient   08/30/2018 1620 08/30/2018 2305 Full Code [id number]  [name], PA-C ED   07/25/2018 1557 07/30/2018 1714 Full Code [id number]  [name], MD ED    06/04/2018 2302 06/13/2018 0204 Full Code [id number]  [name], MD Inpatient   11/27/2016 2247 11/28/2016 1715 Full Code [id number]  [name], MD Inpatient   10/27/2016 1420 10/31/2016 1519 Full Code [id number]  [name], MD Inpatient   08/13/2016 0855 08/13/2016 1559 Full Code [id number]  [name], MD Inpatient   04/02/2016 1736 04/03/2016 1820 Full Code [id number]  [name], NP Inpatient   01/31/2013 0615 02/02/2013 1800 Full Code [id number]  [name], MD Inpatient   01/30/2013 2133 01/31/2013 0615 Full Code [id number]  [name], MD ED   12/28/2012 1235 01/01/2013 1336 Full Code [id number]  [name], MD ED   08/24/2012 0423 08/25/2012 1426 Full Code [id number]  [name]., DO ED     Family Communication: None present Disposition Plan:   Patient will remain inpatient because of high risk of recurrent PEs and possible death.  He will be placed on heparin drip as a bridge until Coumadin therapeutic. Consults called: None today Admission status: Inpatient   Consultants:   Orthopedics  Procedures:  Dg Hand 2 View Left  Result Date: 12/28/2018 CLINICAL DATA:  Distal left index finger pain following a crush injury 3 weeks ago. EXAM: LEFT HAND - 2 VIEW COMPARISON:  None. FINDINGS: There is no evidence of fracture or dislocation. There is no evidence of arthropathy or other focal bone abnormality. Soft tissues are unremarkable. IMPRESSION: Negative. Electronically Signed   By: Claudie Revering M.D.   On: 12/28/2018 12:44   Mr Foot Left [name]  Result Date: 12/27/2018 CLINICAL DATA:  Worsening infection of the left great toe despite antibiotics. EXAM: MRI OF THE LEFT FOREFOOT WITHOUT AND WITH CONTRAST TECHNIQUE: Multiplanar, multisequence MR imaging of the left foot was performed both before and after administration of intravenous contrast. CONTRAST:  8 mm mild Gadavist IV COMPARISON:  Left foot radiographs 12/07/2018 and great toe  radiographs 12/27/2018 FINDINGS: Bones/Joint/Cartilage Subtle cortical bone loss along the dorsal medial aspect of the distal phalanx of the left great toe, series 4/11. Marrow edema of the distal phalanx concerning for early changes of osteomyelitis are noted, series 8/15. No acute fracture joint dislocations. Subcortical cyst involving the lateral cuneiform is identified measuring approximately 8 x 5 x 6 mm. Ligaments Negative Muscles and Tendons No evidence of pyomyositis. Mild intramuscular edema likely neurogenic. Soft tissues Small ganglion cyst along the dorsum of the first proximal phalanx measuring approximately 11 x 6 x 9 mm. Diffuse soft tissue edema is noted consistent with cellulitis. IMPRESSION: 1. Diffuse subcutaneous soft tissue edema the forefoot consistent with history of cellulitis. 2. Subtle cortical bone loss along the dorsal medial aspect of the distal phalanx of the great toe more uniform marrow edema of the distal phalanx raising concern for changes of osteomyelitis. 3. Small ganglion cyst along the dorsum the first proximal phalanx measuring 11 x 6 x 9 mm. 4. Degenerative subcortical cyst of the lateral cuneiform measuring 8 x 5 x 6 mm. Electronically Signed   By: Ashley Royalty M.D.   On: 12/27/2018 21:28   Dg Foot Complete Left  Result Date: 01/09/2019 CLINICAL DATA:  Great toe wound for 4 weeks. Pain and infection. EXAM: LEFT FOOT - COMPLETE 3+ VIEW COMPARISON:  Great toe  radiographs and forefoot MRI 12/27/2018 FINDINGS: No bony destructive change. Equivocal decreased density involving the lateral aspect of the great toe distal tuft at site of osteomyelitis on prior MRI. No periosteal reaction or frank osseous erosions. No soft tissue air or radiopaque foreign body. Soft tissue thickening of the digit. Hammertoe deformity of the second through fifth digits. No findings elsewhere to suggest osteomyelitis. Well corticated density about the dorsal talus may represent sequela of remote prior  injury or accessory ossicle. IMPRESSION: Equivocal decreased density involving the lateral aspect of the great toe distal tuft at site of osteomyelitis on prior MRI. No frank bony destructive change. No soft tissue air or radiopaque foreign body. Electronically Signed   By: Keith Rake M.D.   On: 01/09/2019 19:07   Dg Toe Great Left  Result Date: 12/27/2018 CLINICAL DATA:  necrotic great toe, please eval for possible osteomyelitis/gasPatient reports pain in left great toe for 3 weeks. Patient states it started as blister along dorsal surface of great toe 3 weeks ago, he placed a band-aid on it and 2 weeks ago his distal great toe started to turn black. Pain is localized to great toe and does not radiate into rest of foot. No prior surgery or injury to great toe. EXAM: LEFT GREAT TOE COMPARISON:  12/07/2018 FINDINGS: There is no evidence of fracture or dislocation. No focal cortical erosion to suggest osteomyelitis. There is no evidence of arthropathy or other focal bone abnormality. Soft tissue swelling about the distal phalanx. No subcutaneous gas or radiodense foreign body. IMPRESSION: 1. Soft tissue swelling without bony abnormality. 2. No radiographic evidence of osteomyelitis. The Electronically Signed   By: Lucrezia Europe M.D.   On: 12/27/2018 14:55     Antimicrobials:   Vanc and cefepime [place] #3, will discontinue vancomycin today   Subjective: Did well overnight did report some pain, no bleeding.  Is starting to ambulate with limited weightbearing status  Objective: Vitals:   01/12/19 2206 01/12/19 2251 01/13/19 0036 01/13/19 0644  BP: (!) 142/106 (!) 142/106 (!) 132/93 (!) 127/101  Pulse: 91 91  (!) 104  Resp:      Temp:      TempSrc:      SpO2: 98%   100%  Weight:      Height:        Intake/Output Summary (Last 24 hours) at 01/13/2019 1102 Last data filed at 01/13/2019 0900 Gross per 24 hour  Intake 7246.52 ml  Output 225 ml  Net 7021.52 ml   Filed Weights   01/11/19 1637    Weight: 84.8 kg    Examination:  General exam: Appears calm and comfortable  Respiratory system: Clear to auscultation. Respiratory effort normal. Cardiovascular system: S1 & S2 heard, RRR. No JVD, murmurs, rubs, gallops or clicks. No pedal edema. Gastrointestinal system: Abdomen is nondistended, soft and nontender. No organomegaly or masses felt. Normal bowel sounds heard. Central nervous system: Alert and oriented. No focal neurological deficits. Extremities: Symmetric 5 x 5 power.  Distal extremities neurovascularly intact bilaterally cap refills difficult to measure secondary to fungal toenail infection Skin: No rashes, lesions or ulcers, no bleedthrough at site did not take dressing down Psychiatry: Judgement and insight appear normal. Mood & affect appropriate.     Data Reviewed: I have personally reviewed following labs and imaging studies  CBC: Recent Labs  Lab 01/09/19 1807 01/10/19 0123 01/12/19 0156  WBC 3.9* 3.0* 3.6*  NEUTROABS 2.5  --   --   HGB 11.3* 10.4* 9.6*  HCT 34.7* 32.2* 30.0*  MCV 81.3 79.5* 82.0  PLT 251 217 664   Basic Metabolic Panel: Recent Labs  Lab 01/09/19 1807 01/10/19 0123  NA 138 136  K 4.3 4.2  CL 103 104  CO2 26 25  GLUCOSE 98 103*  BUN 14 12  CREATININE 0.85 0.96  CALCIUM 9.3 8.8*   GFR: Estimated Creatinine Clearance: 82.9 mL/min (by C-G formula based on SCr of 0.96 mg/dL). Liver Function Tests: Recent Labs  Lab 01/10/19 0123  AST 38  ALT 25  ALKPHOS 41  BILITOT 1.1  PROT 5.9*  ALBUMIN 3.2*   No results for input(s): LIPASE, AMYLASE in the last 168 hours. No results for input(s): AMMONIA in the last 168 hours. Coagulation Profile: Recent Labs  Lab 01/10/19 0123 01/13/19 0345  INR 1.5* 1.1   Cardiac Enzymes: No results for input(s): CKTOTAL, CKMB, CKMBINDEX, TROPONINI in the last 168 hours. BNP (last 3 results) No results for input(s): PROBNP in the last 8760 hours. HbA1C: No results for input(s): HGBA1C in  the last 72 hours. CBG: Recent Labs  Lab 01/12/19 1014  GLUCAP 87   Lipid Profile: No results for input(s): CHOL, HDL, LDLCALC, TRIG, CHOLHDL, LDLDIRECT in the last 72 hours. Thyroid Function Tests: No results for input(s): TSH, T4TOTAL, FREET4, T3FREE, THYROIDAB in the last 72 hours. Anemia Panel: No results for input(s): VITAMINB12, FOLATE, FERRITIN, TIBC, IRON, RETICCTPCT in the last 72 hours. Sepsis Labs: Recent Labs  Lab 01/09/19 1827  LATICACIDVEN 1.4    Recent Results (from the past 240 hour(s))  Culture, blood (routine x 2)     Status: None (Preliminary result)   Collection Time: 01/09/19  6:27 PM  Result Value Ref Range Status   Specimen Description   Final    BLOOD LEFT ANTECUBITAL Performed at [facility] [address], [place], [place] [address]    Special Requests   Final    BOTTLES DRAWN AEROBIC AND ANAEROBIC Blood Culture adequate volume Performed at [facility] [address], [place], [place] [address]    Culture   Final    NO GROWTH 4 DAYS Performed at [facility], [place] [address], [place], [place] [address]    Report Status PENDING  Incomplete  Culture, blood (routine x 2)     Status: None (Preliminary result)   Collection Time: 01/09/19  6:50 PM  Result Value Ref Range Status   Specimen Description   Final    BLOOD LEFT ANTECUBITAL Performed at [facility] [address], [place], [place] [address]    Special Requests   Final    BOTTLES DRAWN AEROBIC AND ANAEROBIC Blood Culture adequate volume Performed at [facility] [address] [place], [place] [address]    Culture   Final    NO GROWTH 4 DAYS Performed at [facility], [place] [address], [place],  [address]    Report Status PENDING  Incomplete  Surgical PCR screen     Status: None   Collection Time: 01/12/19  7:25 AM  Result Value Ref Range Status   MRSA, PCR NEGATIVE NEGATIVE Final    Staphylococcus aureus NEGATIVE NEGATIVE Final    Comment: (NOTE) The Xpert SA Assay (FDA approved for NASAL specimens in patients 31 years of age and older), is one component of a comprehensive surveillance program. It is not intended to diagnose infection nor to guide or monitor treatment. Performed at [facility], [address] [place], [place] [address]          Radiology Studies: No results found.      Scheduled Meds: . calcium carbonate  1 tablet Oral TID  . docusate sodium  100 mg Oral BID  . feeding supplement (ENSURE ENLIVE)  237 mL Oral BID BM  . feeding supplement (PRO-STAT SUGAR FREE 64)  30 mL Oral BID  . LORazepam  0-4 mg Intravenous Q12H   Or  . LORazepam  0-4 mg Oral Q12H  . mupirocin ointment  1 application Nasal BID  . nicotine  21 mg Transdermal Daily  . thiamine  100 mg Oral Daily   Or  . thiamine  100 mg Intravenous Daily  . warfarin  7.5 mg Oral ONCE-1800  . Warfarin - Pharmacist Dosing Inpatient   Does not apply q1800   Continuous Infusions: . sodium chloride 75 mL/hr at 01/11/19 1718  . sodium chloride 10 mL/hr at 01/12/19 1125  . sodium chloride 10 mL/hr at 01/12/19 1545  . ceFEPime (MAXIPIME) IV 2 g (01/13/19 0543)  . heparin    . lactated ringers 10 mL/hr at 01/12/19 0838  . methocarbamol (ROBAXIN) IV    . vancomycin 1,250 mg (01/13/19 1020)     LOS: 4 days    Time spent: 88 min    Nicolette Bang, MD [facility]  If 7PM-7AM, please contact night-coverage  01/13/2019, 11:02 AM

## 2019-01-13 NOTE — Progress Notes (Signed)
[place] for warfarin with IV Heparin bridgeIndication: hx recurrent PE  Allergies  Allergen Reactions  . Other Other (See Comments)    "Cactus" blisters on tongue    Patient Measurements: Height: 5\' 9"  (175.3 cm) Weight: 187 lb (84.8 kg) IBW/kg (Calculated) : 70.7  Vital Signs: BP: 127/101 (03/07 0644) Pulse Rate: 104 (03/07 0644)  Labs: Recent Labs    01/10/19 1549 01/11/19 0254 01/12/19 0156 01/13/19 0345  HGB  --   --  9.6*  --   HCT  --   --  30.0*  --   PLT  --   --  174  --   LABPROT  --   --   --  13.6  INR  --   --   --  1.1  HEPARINUNFRC 0.57 0.64 0.45  --     Estimated Creatinine Clearance: 82.9 mL/min (by C-G formula based on SCr of 0.96 mg/dL).   Assessment: 1 YOM with h/o recurrent PE who presented to [facility] with osteomyelitis of left big toe. Patient was on warfarin at home which was discontinued for surgery.  s/p left great toe amputation on 01/12/19.[facility] to resume warfarin last night 3/6.  Today INR is down to 1.1,   Restarting IV heparin bridge , no bolus.  Hgb 9.6, platelets are normal on 01/12/19. No overt bleeding noted.  PTA: 5 mg daily  Goal of Therapy:  INR 2-3 Monitor platelets by anticoagulation protocol: Yes   Plan:  Restart IV heparin drip 1450 units/hr, no bolus. Check heparin level in 6 hours Warfarin 7.5 mg PO tonight Daily INR, HL, CBC Monitor for s/sx of bleeding   Nicole Cella, RPh Clinical Pharmacist Please check AMION for all [place] phone numbers After 10:00 PM, call [place] [phone] 01/13/2019 10:39 AM

## 2019-01-13 NOTE — Progress Notes (Signed)
[place] for warfarin with IV Heparin bridgeIndication: hx recurrent PE  Allergies  Allergen Reactions  . Other Other (See Comments)    "Cactus" blisters on tongue    Patient Measurements: Height: 5\' 9"  (175.3 cm) Weight: 187 lb (84.8 kg) IBW/kg (Calculated) : 70.7  Vital Signs: Temp: 98.1 F (36.7 C) (03/07 1804) Temp Source: Oral (03/07 1804) BP: 106/81 (03/07 1804) Pulse Rate: 74 (03/07 1804)  Labs: Recent Labs    01/12/19 0156 01/13/19 0345 01/13/19 1051 01/13/19 1850  HGB 9.6*  --   --  9.3*  HCT 30.0*  --   --  30.0*  PLT 174  --   --  175  LABPROT  --  13.6  --   --   INR  --  1.1  --   --   HEPARINUNFRC 0.45  --  <0.10* 0.25*    Estimated Creatinine Clearance: 82.9 mL/min (by C-G formula based on SCr of 0.96 mg/dL).   Assessment: 2 YOM with h/o recurrent PE who presented to [facility] with osteomyelitis of left big toe. Patient was on warfarin at home which was discontinued for surgery.  s/p left great toe amputation on 01/12/19.[facility] to resume warfarin last night 3/6.  Today INR is down to 1.1,   Restarting IV heparin bridge , no bolus.  Hgb 9.6, platelets are normal on 01/12/19. No overt bleeding noted.  Initial hep lvl low 0.25  PTA: 5 mg daily  Goal of Therapy:  INR 2-3 Monitor platelets by anticoagulation protocol: Yes   Plan:  Increase heparin drip 1600 units/hr, no bolus. Daily hep lvl cbc Warfarin as previously ordered  Levester Fresh, PharmD, BCPS, BCCCP Clinical Pharmacist [phone]  Please check AMION for all [place] numbers  01/13/2019 7:35 PM

## 2019-01-13 NOTE — Progress Notes (Signed)
Subjective: 1 [place] Post-Op Procedure(s) (LRB): LEFT GREAT TOE AMPUTATION (Left) Patient reports pain as moderate to severe leftt foot pain.  Objective: Vital signs in last 24 hours: Temp:  [97.3 F (36.3 C)-97.8 F (36.6 C)] 97.8 F (36.6 C) (03/06 1758) Pulse Rate:  [75-176] 104 (03/07 0644) Resp:  [15-19] 18 (03/06 1121) BP: (102-142)/(82-125) 127/101 (03/07 0644) SpO2:  [97 %-100 %] 100 % (03/07 0644)  Intake/Output from previous [place]: 03/06 0701 - 03/07 0700 In: 7806.5 [P.O.:680; I.V.:3924; IV Piggyback:3202.6] Out: 235 [Urine:225; Blood:10] Intake/Output this shift: No intake/output data recorded.  Recent Labs    01/12/19 0156  HGB 9.6*   Recent Labs    01/12/19 0156  WBC 3.6*  RBC 3.66*  HCT 30.0*  PLT 174   No results for input(s): NA, K, CL, CO2, BUN, CREATININE, GLUCOSE, CALCIUM in the last 72 hours. Recent Labs    01/13/19 0345  INR 1.1   Left lower extremity: Dorsiflexion/Plantar flexion intact Incision: dressing C/D/I Compartment soft   Assessment/Plan: 1 [place] Post-Op Procedure(s) (LRB): LEFT GREAT TOE AMPUTATION (Left) Up with therapy touch down weight bearing left lower extremity Keep dressing intact       Tomislav Micale 01/13/2019, 8:00 AM

## 2019-01-14 LAB — CBC
HCT: 28 % — ABNORMAL LOW (ref 39.0–52.0)
Hemoglobin: 9.1 g/dL — ABNORMAL LOW (ref 13.0–17.0)
MCH: 26.7 pg (ref 26.0–34.0)
MCHC: 32.5 g/dL (ref 30.0–36.0)
MCV: 82.1 fL (ref 80.0–100.0)
Platelets: 172 10*3/uL (ref 150–400)
RBC: 3.41 MIL/uL — ABNORMAL LOW (ref 4.22–5.81)
RDW: 15.7 % — ABNORMAL HIGH (ref 11.5–15.5)
WBC: 4.2 10*3/uL (ref 4.0–10.5)
nRBC: 0 % (ref 0.0–0.2)

## 2019-01-14 LAB — CULTURE, BLOOD (ROUTINE X 2)
Culture: NO GROWTH
Culture: NO GROWTH
Special Requests: ADEQUATE
Special Requests: ADEQUATE

## 2019-01-14 LAB — PROTIME-INR
INR: 1.3 — ABNORMAL HIGH (ref 0.8–1.2)
Prothrombin Time: 15.6 seconds — ABNORMAL HIGH (ref 11.4–15.2)

## 2019-01-14 LAB — HEPARIN LEVEL (UNFRACTIONATED)
Heparin Unfractionated: 0.5 IU/mL (ref 0.30–0.70)
Heparin Unfractionated: 0.69 IU/mL (ref 0.30–0.70)

## 2019-01-14 MED ORDER — WARFARIN SODIUM 7.5 MG PO TABS
7.5000 mg | ORAL_TABLET | Freq: Once | ORAL | Status: AC
  Administered 2019-01-14: 7.5 mg via ORAL
  Filled 2019-01-14: qty 1

## 2019-01-14 NOTE — Progress Notes (Signed)
Physical Therapy Treatment Patient Details Name: Leonard Bentley MRN: [id number] DOB: 11/06/1961 Today's Date: 01/14/2019    History of Present Illness Pt is 58 yo male who presents for L great toe amp due to osteomyelitis. PMH: EtOH abuse, HTN, Non Hodgkins, PE, Tachycardia, MVR.     PT Comments    Continuing work on functional mobility and activity tolerance;   Session focused on gait and stair training, needing extra time for education on TDWB LLE; Continues to require cueing to adhere to LLE TDWB precautions throughout session, using RW to compensate for weight through L LE with poor adherence.  Reviewed home safety and fall prevention, pt hesitant about dc home (reports 'clutter' at home), informed case manager.   Follow Up Recommendations  Home health PT;Other (comment)(if able to get and pt agrees)     Equipment Recommendations  Rolling walker with 5" wheels;Other (comment)(tub transfer bench per OT)    Recommendations for Other Services       Precautions / Restrictions Precautions Precautions: None Required Braces or Orthoses: Other Brace Other Brace: has post op shoe for both feet Restrictions Weight Bearing Restrictions: Yes LLE Weight Bearing: Touchdown weight bearing Other Position/Activity Restrictions: pt seeming to have a hard time understanding this, especially in regards to how it applies to function    Mobility  Bed Mobility Overal bed mobility: Independent                Transfers Overall transfer level: Needs assistance Equipment used: Rolling walker (2 wheeled) Transfers: Sit to/from Stand Sit to Stand: Supervision         General transfer comment: pt requires cueing for hand placement and weightbearing restriction adherance during transfers   Ambulation/Gait Ambulation/Gait assistance: Supervision Gait Distance (Feet): 50 Feet(2x25) Assistive device: Rolling walker (2 wheeled) Gait Pattern/deviations: Decreased step length - right Gait  velocity: decreased   General Gait Details: instructed to decreased R step length to not transfer wt to L forefoot; Verbal cues and demo cues given; extra time to educate on TDWB   Stairs Stairs: Yes Stairs assistance: Min guard Stair Management: One rail Left;Step to pattern;Forwards Number of Stairs: 3 General stair comments: Little carryover from previous session; Demonstrated technqiue and gave rationale for using just one rail, but he still tended to reach for second rail; Not maintaining TDWB LLE during stair trainigng   Wheelchair Mobility    Modified Rankin (Stroke Patients Only)       Balance Overall balance assessment: No apparent balance deficits (not formally assessed)                                          Cognition Arousal/Alertness: Awake/alert Behavior During Therapy: WFL for tasks assessed/performed Overall Cognitive Status: No family/caregiver present to determine baseline cognitive functioning                                 General Comments: pt with increased time for processing, poor problem sovling, and difficulty maintaining precautions      Exercises      General Comments General comments (skin integrity, edema, etc.): pt with poor adherance to precautions, unclear if fully understanding WB restriction, reports "unsure" if he will be able to do "all this" at home      Pertinent Vitals/Pain Pain Assessment: 0-10 Pain Score: 7  Pain Location: both feet Pain Descriptors / Indicators: Burning;Aching Pain Intervention(s): Monitored during session;Premedicated before session    Home Living                      Prior Function            PT Goals (current goals can now be found in the care plan section) Acute Rehab PT Goals Patient Stated Goal: get back to martial arts PT Goal Formulation: With patient Time For Goal Achievement: 01/20/19 Potential to Achieve Goals: Good Progress towards PT goals:  Progressing toward goals    Frequency    Min 3X/week      PT Plan Other (comment);Current plan remains appropriate(Discussed with Case Mgr and OT)    Co-evaluation              AM-PAC PT "6 Clicks" Mobility   Outcome Measure  Help needed turning from your back to your side while in a flat bed without using bedrails?: None Help needed moving from lying on your back to sitting on the side of a flat bed without using bedrails?: None Help needed moving to and from a bed to a chair (including a wheelchair)?: None Help needed standing up from a chair using your arms (e.g., wheelchair or bedside chair)?: None Help needed to walk in hospital room?: A Little Help needed climbing 3-5 steps with a railing? : A Little 6 Click Score: 22    End of Session Equipment Utilized During Treatment: Gait belt Activity Tolerance: Patient tolerated treatment well Patient left: Other (comment)(With OT) Nurse Communication: Mobility status PT Visit Diagnosis: Pain;Difficulty in walking, not elsewhere classified (R26.2) Pain - Right/Left: Left Pain - part of body: Ankle and joints of foot     Time: 9191-6606 PT Time Calculation (min) (ACUTE ONLY): 29 min  Charges:  $Gait Training: 23-37 mins                     Roney Marion, [place]  Acute Rehabilitation Services Pager [phone] Office [place] 01/14/2019, 12:10 PM

## 2019-01-14 NOTE — Progress Notes (Signed)
Pharmacy Antibiotic Note  Leonard Bentley is a 58 y.o. male admitted on 01/09/2019 with osteomyelitis.  Pharmacy has been consulted for vancomycin and cefepime dosing.  Vanc peak of 32 and trough of 14 yields calculated AUC of 579, within goal.  Plan: This patient's current antibiotics will be continued without adjustments.  Height: 5\' 9"  (175.3 cm) Weight: 187 lb (84.8 kg) IBW/kg (Calculated) : 70.7  Temp (24hrs), Avg:98 F (36.7 C), Min:97.5 F (36.4 C), Max:98.3 F (36.8 C)  Recent Labs  Lab 01/09/19 1807 01/09/19 1827 01/10/19 0123 01/12/19 0156 01/13/19 1225 01/13/19 1850 01/13/19 2205  WBC 3.9*  --  3.0* 3.6*  --  4.7  --   CREATININE 0.85  --  0.96  --   --   --   --   LATICACIDVEN  --  1.4  --   --   --   --   --   VANCOTROUGH  --   --   --   --   --   --  14*  VANCOPEAK  --   --   --   --  32  --   --     Estimated Creatinine Clearance: 82.9 mL/min (by C-G formula based on SCr of 0.96 mg/dL).    Allergies  Allergen Reactions  . Other Other (See Comments)    "Cactus" blisters on tongue    Thank you for allowing pharmacy to be a part of this patient's care.  Wynona Neat, PharmD, BCPS  01/14/2019 12:14 AM

## 2019-01-14 NOTE — Plan of Care (Signed)
  Problem: Clinical Measurements: Goal: Ability to maintain clinical measurements within normal limits will improve Outcome: Progressing   Problem: Activity: Goal: Risk for activity intolerance will decrease Outcome: Progressing   Problem: Nutrition: Goal: Adequate nutrition will be maintained Outcome: Progressing   Problem: Elimination: Goal: Will not experience complications related to bowel motility Outcome: Progressing   Problem: Safety: Goal: Ability to remain free from injury will improve Outcome: Progressing   Problem: Skin Integrity: Goal: Risk for impaired skin integrity will decrease Outcome: Progressing   

## 2019-01-14 NOTE — Progress Notes (Signed)
PROGRESS NOTE    Leonard Bentley  SEG:[id number] DOB: [birth year] DOA: 01/09/2019 PCP: Leonard Lung, MD   Brief Narrative:  Per admitting physician: Leonard Bentley a 58 y.o.malewith medical history significant ofrecurrent pulmonary embolism, hypertension, alcoholism, tobacco abuse, peripheral vascular disease, non-Hodgkin's lymphoma who was sent over to the ER by [facility] due to osteomyelitis of the left big toe. He was being followed for nonhealing wound in the area. He was on trial of of antibiotics treatment which has not decreased his risk. He was last on Augmentin. He has been quite having continued pain pain is 8 out of 10 with medications not helping. He was having some mild tachycardia also when he visited the orthopedics office so he was sent over to the ER for evaluation. Patient noted to have osteomyelitis with open wound. He has been admitted to [facility] for schedule amputation of the left big toe this Friday..  ED Course:Temperature 98.1 blood pressure is 129/103 pulse 132 respirate 20 oxygen sat 97% on room air. White count 3.9 hemoglobin 11.3 and platelets 251. Chemistry otherwise is within normal lactic acid 1.5. PT 19 point INR 1.63. X-ray of the left foot showed acute focal decreased density involving the lateral aspect of the great toe consistent with osteomyelitis previously seen on MRI.   Assessment & Plan:   Principal Problem:   Osteomyelitis of great toe of left foot (HCC) Active Problems:   History of non-Hodgkin's lymphoma   Alcohol abuse   Cigarette smoker   SVT (supraventricular tachycardia) (HCC)   Recurrent pulmonary emboli (HCC)   HTN (hypertension)   #1 osteomyelitis of the left big toenow postop Manas 1:Patient has failed outpatient antibiotic treatment.He will remain inpatient,Blood cultures have been obtainedandare negative,. Orthopedics on consultation with surgery March 6.  With subsequent left big toe amputation.  No  intraoperative complications, cleared to restart anticoagulation.  #2 history of pulmonary embolism: Preoperatively patient was on Coumadin and subtherapeutic. With surgery comingadmitting physiciandiscontinuedwarfarin. InitiatedIV heparinon admission, heparin was stopped for surgery.  Because of history of recurrent PEs bilateral and multiple patient will be restarted on Coumadin with bridge.  Patient reported inability to afford additional long-term anticoagulants so we will continue with Coumadin.  #3 alcohol abuse:Patient denied taking alcohol PTA although he has had recent DTs. We willcontinue withCIWA protocol empirically. In the meantime continue monitoring, to this point no evidence of withdrawals  #4 tobacco abuse:Nicotine patch will becontinuedwith tobacco cessation counseling.  #5 hypertension:Continue blood pressure control.  #6 supraventricular tachycardia:Most likely secondary to alcohol withdrawals. Patient is currently stable. He has chronic SVT.  DVT prophylaxis: VP:[id number] and hep gtt  Code Status: full    Code Status Orders  (From admission, onward)         Start     Ordered   01/09/19 2321  Full code  Continuous     01/09/19 2320        Code Status History    Date Active Date Inactive Code Status Order ID Comments User Context   12/27/2018 1619 12/31/2018 1428 Full Code [id number]  Leonard Hillock, MD ED   12/07/2018 2253 12/10/2018 1723 Full Code [id number]  Leonard Grout, MD ED   11/27/2018 0528 11/28/2018 1548 Full Code [id number]  Leonard Grout, MD ED   11/02/2018 0135 11/08/2018 1511 Full Code [id number]  Leonard Costa, MD ED   11/02/2018 0053 11/02/2018 0135 Full Code [id number]  Leonard Blank, MD ED   08/30/2018 2305 09/01/2018 0042 Full  Code [id number]  Leonard Baker, MD Inpatient   08/30/2018 1620 08/30/2018 2305 Full Code [id number]  Leonard Kuster, PA-C ED   07/25/2018 1557 07/30/2018 1714 Full Code [id number]  Leonard Bongo,  MD ED   06/04/2018 2302 06/13/2018 0204 Full Code [id number]  Leonard Patience, MD Inpatient   11/27/2016 2247 11/28/2016 1715 Full Code [id number]  Leonard Baker, MD Inpatient   10/27/2016 1420 10/31/2016 1519 Full Code [id number]  Leonard Alberts, MD Inpatient   08/13/2016 0855 08/13/2016 1559 Full Code [id number]  Leonard Booze, MD Inpatient   04/02/2016 1736 04/03/2016 1820 Full Code [id number]  Leonard Parr, NP Inpatient   01/31/2013 0615 02/02/2013 1800 Full Code [id number]  Leonard Patience, MD Inpatient   01/30/2013 2133 01/31/2013 0615 Full Code [id number]  Leonard Pai., MD ED   12/28/2012 1235 01/01/2013 1336 Full Code [id number]  Leonard Lose, MD ED   08/24/2012 0423 08/25/2012 1426 Full Code [id number]  Leonard Bentley., DO ED     Family Communication: none present  Disposition Plan:   Patient remained inpatient additional [place] for continued heparin drip with bridging to Coumadin in the setting of multiple PEs.  Anticipate disposition home tomorrow Consults called: None Admission status: Inpatient   Consultants:   Orthopedics  Procedures:  Dg Hand 2 View Left  Result Date: 12/28/2018 CLINICAL DATA:  Distal left index finger pain following a crush injury 3 weeks ago. EXAM: LEFT HAND - 2 VIEW COMPARISON:  None. FINDINGS: There is no evidence of fracture or dislocation. There is no evidence of arthropathy or other focal bone abnormality. Soft tissues are unremarkable. IMPRESSION: Negative. Electronically Signed   By: Claudie Revering M.D.   On: 12/28/2018 12:44   Mr Foot Left [name]  Result Date: 12/27/2018 CLINICAL DATA:  Worsening infection of the left great toe despite antibiotics. EXAM: MRI OF THE LEFT FOREFOOT WITHOUT AND WITH CONTRAST TECHNIQUE: Multiplanar, multisequence MR imaging of the left foot was performed both before and after administration of intravenous contrast. CONTRAST:  8 mm mild Gadavist IV COMPARISON:  Left foot radiographs 12/07/2018 and  great toe radiographs 12/27/2018 FINDINGS: Bones/Joint/Cartilage Subtle cortical bone loss along the dorsal medial aspect of the distal phalanx of the left great toe, series 4/11. Marrow edema of the distal phalanx concerning for early changes of osteomyelitis are noted, series 8/15. No acute fracture joint dislocations. Subcortical cyst involving the lateral cuneiform is identified measuring approximately 8 x 5 x 6 mm. Ligaments Negative Muscles and Tendons No evidence of pyomyositis. Mild intramuscular edema likely neurogenic. Soft tissues Small ganglion cyst along the dorsum of the first proximal phalanx measuring approximately 11 x 6 x 9 mm. Diffuse soft tissue edema is noted consistent with cellulitis. IMPRESSION: 1. Diffuse subcutaneous soft tissue edema the forefoot consistent with history of cellulitis. 2. Subtle cortical bone loss along the dorsal medial aspect of the distal phalanx of the great toe more uniform marrow edema of the distal phalanx raising concern for changes of osteomyelitis. 3. Small ganglion cyst along the dorsum the first proximal phalanx measuring 11 x 6 x 9 mm. 4. Degenerative subcortical cyst of the lateral cuneiform measuring 8 x 5 x 6 mm. Electronically Signed   By: Ashley Royalty M.D.   On: 12/27/2018 21:28   Dg Foot Complete Left  Result Date: 01/09/2019 CLINICAL DATA:  Great toe wound for 4 weeks. Pain and infection. EXAM: LEFT FOOT - COMPLETE 3+ VIEW COMPARISON:  Great toe radiographs and forefoot  MRI 12/27/2018 FINDINGS: No bony destructive change. Equivocal decreased density involving the lateral aspect of the great toe distal tuft at site of osteomyelitis on prior MRI. No periosteal reaction or frank osseous erosions. No soft tissue air or radiopaque foreign body. Soft tissue thickening of the digit. Hammertoe deformity of the second through fifth digits. No findings elsewhere to suggest osteomyelitis. Well corticated density about the dorsal talus may represent sequela of  remote prior injury or accessory ossicle. IMPRESSION: Equivocal decreased density involving the lateral aspect of the great toe distal tuft at site of osteomyelitis on prior MRI. No frank bony destructive change. No soft tissue air or radiopaque foreign body. Electronically Signed   By: Keith Rake M.D.   On: 01/09/2019 19:07   Dg Toe Great Left  Result Date: 12/27/2018 CLINICAL DATA:  necrotic great toe, please eval for possible osteomyelitis/gasPatient reports pain in left great toe for 3 weeks. Patient states it started as blister along dorsal surface of great toe 3 weeks ago, he placed a band-aid on it and 2 weeks ago his distal great toe started to turn black. Pain is localized to great toe and does not radiate into rest of foot. No prior surgery or injury to great toe. EXAM: LEFT GREAT TOE COMPARISON:  12/07/2018 FINDINGS: There is no evidence of fracture or dislocation. No focal cortical erosion to suggest osteomyelitis. There is no evidence of arthropathy or other focal bone abnormality. Soft tissue swelling about the distal phalanx. No subcutaneous gas or radiodense foreign body. IMPRESSION: 1. Soft tissue swelling without bony abnormality. 2. No radiographic evidence of osteomyelitis. The Electronically Signed   By: Lucrezia Europe M.D.   On: 12/27/2018 14:55     Antimicrobials:   cefepime Bierlein 4   Subjective: Reports mild pain this morning.  Otherwise no acute events, pt is ambulating with limited weightbearing status.  Objective: Vitals:   01/13/19 1804 01/13/19 2012 01/14/19 0552 01/14/19 0824  BP: 106/81 (!) 114/98 (!) 121/104 (!) 135/98  Pulse: 74 74 74 88  Resp: 16   16  Temp: 98.1 F (36.7 C) 98.3 F (36.8 C) (!) 97.4 F (36.3 C) (!) 97.4 F (36.3 C)  TempSrc: Oral Oral Oral Oral  SpO2: 100% 100% 99% 99%  Weight:      Height:        Intake/Output Summary (Last 24 hours) at 01/14/2019 1136 Last data filed at 01/14/2019 0552 Gross per 24 hour  Intake 240 ml  Output 400  ml  Net -160 ml   Filed Weights   01/11/19 1637  Weight: 84.8 kg    Examination:  General exam: Appears calm and comfortable  Respiratory system: Clear to auscultation. Respiratory effort normal. Cardiovascular system: S1 & S2 heard, RRR. No JVD, murmurs, rubs, gallops or clicks. No pedal edema. Gastrointestinal system: Abdomen is nondistended, soft and nontender. No organomegaly or masses felt. Normal bowel sounds heard. Central nervous system: Alert and oriented. No focal neurological deficits. Extremities: Symmetric 5 x 5 power.  Status post big toe amputation, no bleedthrough left lower extremity, neurovascular intact. Skin: No rashes, lesions or ulcers Psychiatry: Judgement and insight appear normal. Mood & affect appropriate.     Data Reviewed: I have personally reviewed following labs and imaging studies  CBC: Recent Labs  Lab 01/09/19 1807 01/10/19 0123 01/12/19 0156 01/13/19 1850 01/14/19 0526  WBC 3.9* 3.0* 3.6* 4.7 4.2  NEUTROABS 2.5  --   --   --   --   HGB 11.3* 10.4*  9.6* 9.3* 9.1*  HCT 34.7* 32.2* 30.0* 30.0* 28.0*  MCV 81.3 79.5* 82.0 82.0 82.1  PLT 251 217 174 175 161   Basic Metabolic Panel: Recent Labs  Lab 01/09/19 1807 01/10/19 0123  NA 138 136  K 4.3 4.2  CL 103 104  CO2 26 25  GLUCOSE 98 103*  BUN 14 12  CREATININE 0.85 0.96  CALCIUM 9.3 8.8*   GFR: Estimated Creatinine Clearance: 82.9 mL/min (by C-G formula based on SCr of 0.96 mg/dL). Liver Function Tests: Recent Labs  Lab 01/10/19 0123  AST 38  ALT 25  ALKPHOS 41  BILITOT 1.1  PROT 5.9*  ALBUMIN 3.2*   No results for input(s): LIPASE, AMYLASE in the last 168 hours. No results for input(s): AMMONIA in the last 168 hours. Coagulation Profile: Recent Labs  Lab 01/10/19 0123 01/13/19 0345 01/14/19 0526  INR 1.5* 1.1 1.3*   Cardiac Enzymes: No results for input(s): CKTOTAL, CKMB, CKMBINDEX, TROPONINI in the last 168 hours. BNP (last 3 results) No results for input(s):  PROBNP in the last 8760 hours. HbA1C: No results for input(s): HGBA1C in the last 72 hours. CBG: Recent Labs  Lab 01/12/19 1014  GLUCAP 87   Lipid Profile: No results for input(s): CHOL, HDL, LDLCALC, TRIG, CHOLHDL, LDLDIRECT in the last 72 hours. Thyroid Function Tests: No results for input(s): TSH, T4TOTAL, FREET4, T3FREE, THYROIDAB in the last 72 hours. Anemia Panel: No results for input(s): VITAMINB12, FOLATE, FERRITIN, TIBC, IRON, RETICCTPCT in the last 72 hours. Sepsis Labs: Recent Labs  Lab 01/09/19 1827  LATICACIDVEN 1.4    Recent Results (from the past 240 hour(s))  Culture, blood (routine x 2)     Status: None   Collection Time: 01/09/19  6:27 PM  Result Value Ref Range Status   Specimen Description   Final    BLOOD LEFT ANTECUBITAL Performed at [facility] [address], [place], [place] [address]    Special Requests   Final    BOTTLES DRAWN AEROBIC AND ANAEROBIC Blood Culture adequate volume Performed at [facility] [address], [place], [place] [address]    Culture   Final    NO GROWTH 5 DAYS Performed at [facility], [place] [address] [place], [place] [address]    Report Status 01/14/2019 FINAL  Final  Culture, blood (routine x 2)     Status: None   Collection Time: 01/09/19  6:50 PM  Result Value Ref Range Status   Specimen Description   Final    BLOOD LEFT ANTECUBITAL Performed at [facility] [address], [place], [place] [address]    Special Requests   Final    BOTTLES DRAWN AEROBIC AND ANAEROBIC Blood Culture adequate volume Performed at [facility] [address] [place], [place] [address]    Culture   Final    NO GROWTH 5 DAYS Performed at [facility], [place] [address] [place], [place] [address]    Report Status 01/14/2019 FINAL  Final  Surgical PCR screen     Status: None   Collection Time: 01/12/19  7:25 AM  Result Value Ref Range Status    MRSA, PCR NEGATIVE NEGATIVE Final   Staphylococcus aureus NEGATIVE NEGATIVE Final    Comment: (NOTE) The Xpert SA Assay (FDA approved for NASAL specimens in patients 12 years of age and older), is one component of a comprehensive surveillance program. It is not intended to diagnose infection nor to  guide or monitor treatment. Performed at [facility], [place] [address] [place], [place] [address]          Radiology Studies: No results found.      Scheduled Meds: . calcium carbonate  1 tablet Oral TID  . docusate sodium  100 mg Oral BID  . feeding supplement (ENSURE ENLIVE)  237 mL Oral BID BM  . feeding supplement (PRO-STAT SUGAR FREE 64)  30 mL Oral BID  . mupirocin ointment  1 application Nasal BID  . nicotine  21 mg Transdermal Daily  . thiamine  100 mg Oral Daily   Or  . thiamine  100 mg Intravenous Daily  . warfarin  7.5 mg Oral ONCE-1800  . Warfarin - Pharmacist Dosing Inpatient   Does not apply q1800   Continuous Infusions: . sodium chloride 75 mL/hr at 01/11/19 1718  . sodium chloride 10 mL/hr at 01/12/19 1125  . sodium chloride 10 mL/hr at 01/12/19 1545  . ceFEPime (MAXIPIME) IV 2 g (01/14/19 0551)  . heparin 1,600 Units/hr (01/14/19 0550)  . lactated ringers 10 mL/hr at 01/12/19 0838  . methocarbamol (ROBAXIN) IV    . vancomycin 1,250 mg (01/14/19 0925)     LOS: 5 days    Time spent: 28 min    Nicolette Bang, MD [facility]  If 7PM-7AM, please contact night-coverage  01/14/2019, 11:36 AM

## 2019-01-14 NOTE — Progress Notes (Signed)
Occupational Therapy Treatment Patient Details Name: Leonard Bentley MRN: [id number] DOB: [birth year] Today's Date: 01/14/2019    History of present illness Pt is 58 yo male who presents for L great toe amp due to osteomyelitis. PMH: EtOH abuse, HTN, Non Hodgkins, PE, Tachycardia, MVR.    OT comments  Patient handoff from PT, pleasant and cooperative.  Completed tub transfer using tub bench with min guard, given cueing for safety and technique.  Continues to require cueing to adhere to LLE TDWB precautions throughout session, using RW to compensate for weight through L LE with poor adherence.  Reviewed home safety and fall prevention, pt hesitant about dc home (reports 'clutter' at home), informed case manager. Encouraged use of chair at sink to bathe/ complete grooming until cleared to shower by MD.  Highly recommended home safety eval, but patient declining any form of assistance at home. Recommend further cognitive assessment. Will follow.   Follow Up Recommendations  No OT follow up    Equipment Recommendations  Tub/shower bench    Recommendations for Other Services Other (comment)(HOME SAFETY EVAL)    Precautions / Restrictions Precautions Precautions: None Required Braces or Orthoses: Other Brace Other Brace: has post op shoe for both feet Restrictions Weight Bearing Restrictions: Yes LLE Weight Bearing: Touchdown weight bearing Other Position/Activity Restrictions: pt seeming to have a hard time understanding this, especially in regards to how it applies to function       Mobility Bed Mobility Overal bed mobility: Independent                Transfers Overall transfer level: Needs assistance Equipment used: Rolling walker (2 wheeled) Transfers: Sit to/from Stand Sit to Stand: Supervision         General transfer comment: pt requires cueing for hand placement and weightbearing restriction adherance during transfers     Balance Overall balance assessment: No  apparent balance deficits (not formally assessed)                                         ADL either performed or assessed with clinical judgement   ADL Overall ADL's : Needs assistance/impaired     Grooming: Supervision/safety;Standing Grooming Details (indicate cue type and reason): reviewed safety and use of chair at sink, completing seated in order to adhere to precautions to L LE                  Toilet Transfer: Modified Independent;Ambulation;RW;Comfort height toilet Toilet Transfer Details (indicate cue type and reason): safety concerns-poor adherance to precautions  Toileting- Clothing Manipulation and Hygiene: Modified independent;Sit to/from stand Toileting - Clothing Manipulation Details (indicate cue type and reason): safety concerns- poor adherance to precautions  Tub/ Shower Transfer: Tub transfer;Min guard;Ambulation;Tub bench;Rolling walker Tub/Shower Transfer Details (indicate cue type and reason): educated on technique and safety, but not showering until cleared by MD (agreeable to basin bathe until cleared), requires min guard for safety and hand placement to ascend from bench  Functional mobility during ADLs: Rolling walker;Supervision/safety;Cueing for safety General ADL Comments: reviewed safety with ADLs, home safety and fall prevention (discussed clearing pathways, appropriate lighting, and removing throw rugs), discussed meal delivery/meals on wheels, and encouraged patient to reach out to family for more support      Vision       Perception     Praxis      Cognition Arousal/Alertness: Awake/alert Behavior During  Therapy: WFL for tasks assessed/performed Overall Cognitive Status: No family/caregiver present to determine baseline cognitive functioning                                 General Comments: pt with increased time for processing, poor problem sovling, and difficulty maintaining precautions; further cognitive  assessment recommended         Exercises     Shoulder Instructions       General Comments pt with poor adherance to precautions, unclear if fully understanding WB restriction, reports "unsure" if he will be able to do "all this" at home    Pertinent Vitals/ Pain       Pain Assessment: 0-10 Pain Score: 7  Pain Location: both feet Pain Descriptors / Indicators: Burning;Aching Pain Intervention(s): Monitored during session;Repositioned;Patient requesting pain meds-RN notified  Home Living                                          Prior Functioning/Environment              Frequency  Min 2X/week        Progress Toward Goals  OT Goals(current goals can now be found in the care plan section)  Progress towards OT goals: Progressing toward goals  Acute Rehab OT Goals Patient Stated Goal: get back to martial arts OT Goal Formulation: With patient Time For Goal Achievement: 01/20/19 Potential to Achieve Goals: Good  Plan Discharge plan remains appropriate;Frequency remains appropriate    Co-evaluation                 AM-PAC OT "6 Clicks" Daily Activity     Outcome Measure   Help from another person eating meals?: None Help from another person taking care of personal grooming?: None Help from another person toileting, which includes using toliet, bedpan, or urinal?: None Help from another person bathing (including washing, rinsing, drying)?: None Help from another person to put on and taking off regular upper body clothing?: None Help from another person to put on and taking off regular lower body clothing?: None 6 Click Score: 24    End of Session Equipment Utilized During Treatment: Gait belt;Rolling walker;Other (comment)(post op shoes)  OT Visit Diagnosis: Pain;Other abnormalities of gait and mobility (R26.89) Pain - part of body: (feet)   Activity Tolerance Patient tolerated treatment well   Patient Left in bed;with call  bell/phone within reach   Nurse Communication Mobility status;Patient requests pain meds        Time: 8768-1157 OT Time Calculation (min): 23 min  Charges: OT General Charges $OT Visit: 1 Visit OT Treatments $Self Care/Home Management : 23-37 mins  Delight Stare, [place] Pager [phone] Office [phone]    Delight Stare 01/14/2019, 9:30 AM

## 2019-01-14 NOTE — Progress Notes (Addendum)
Leonard Bentley for warfarin with IV Heparin bridgeIndication: hx recurrent PE  Allergies  Allergen Reactions  . Other Other (See Comments)    "Cactus" blisters on tongue    Patient Measurements: Height: 5\' 9"  (175.3 cm) Weight: 187 lb (84.8 kg) IBW/kg (Calculated) : 70.7  Vital Signs: Temp: 97.4 F (36.3 C) (03/08 0824) Temp Source: Oral (03/08 0824) BP: 135/98 (03/08 0824) Pulse Rate: 88 (03/08 0824)  Labs: Recent Labs    01/12/19 0156 01/13/19 0345 01/13/19 1051 01/13/19 1850 01/14/19 0526  HGB 9.6*  --   --  9.3* 9.1*  HCT 30.0*  --   --  30.0* 28.0*  PLT 174  --   --  175 172  LABPROT  --  13.6  --   --  15.6*  INR  --  1.1  --   --  1.3*  HEPARINUNFRC 0.45  --  <0.10* 0.25* 0.50    Estimated Creatinine Clearance: 82.9 mL/min (by C-G formula based on SCr of 0.96 mg/dL).   Assessment: 46 YOM with h/o recurrent PE who presented to [facility] with osteomyelitis of left big toe. Patient was on warfarin at home which was discontinued for surgery.  s/p left great toe amputation on 01/12/19. Pharmacy consulted to resume warfarin 3/6.  Restarted IV heparin bridge 01/13/19, no bolus.   Heparin level = 0.50, therapeutic this AM on heparin drip 1600 units/hr.  INR = 1.3 Hgb low/stable at 9.1 and pltc wnl. No bleeding noted.  PTA: 5 mg daily  Goal of Therapy:  Heparin level = 0.3-0.7 units/ml INR 2-3 Monitor platelets by anticoagulation protocol: Yes   Plan:  Continue IV heparin drip 1600 units/hr, no bolus. Repeat 6-8 hr heparin level to confirm remains therapeutic. Daily hep lvl cbc, INR Warfarin 7.5 mg today x1  Nicole Cella, [place] Clinical Pharmacist [phone] Please check AMION for all [place] numbers 01/14/2019 11:17 AM    Addendum:  confirmatory 6h HL= 0.69 on 1600 ut/hr heparin drip. No bleeding noted.  Level remains therapeutic.  Goal HL 0.3-0.7 units/ml   Nicole Cella, RPh Clinical Pharmacist [phone] Please check AMION for  all [place] numbers 01/14/2019 3:56 PM

## 2019-01-14 NOTE — Plan of Care (Signed)
  Problem: Activity: Goal: Risk for activity intolerance will decrease Outcome: Progressing   Problem: Coping: Goal: Level of anxiety will decrease Outcome: Progressing   Problem: Pain Managment: Goal: General experience of comfort will improve Outcome: Progressing   Problem: Safety: Goal: Ability to remain free from injury will improve Outcome: Progressing   Problem: Skin Integrity: Goal: Risk for impaired skin integrity will decrease Outcome: Progressing   

## 2019-01-14 NOTE — Progress Notes (Signed)
Subjective: 2 Days Post-Op Procedure(s) (LRB): LEFT GREAT TOE AMPUTATION (Left) Patient reports pain as moderate.    Objective: Vital signs in last 24 hours: Temp:  [97.4 F (36.3 C)-98.3 F (36.8 C)] 97.4 F (36.3 C) (03/08 0552) Pulse Rate:  [73-74] 74 (03/08 0552) Resp:  [16] 16 (03/07 1804) BP: (106-121)/(81-104) 121/104 (03/08 0552) SpO2:  [99 %-100 %] 99 % (03/08 0552)  Intake/Output from previous [place]: 03/07 0701 - 03/08 0700 In: 480 [P.O.:480] Out: 400 [Urine:400] Intake/Output this shift: No intake/output data recorded.  Recent Labs    01/12/19 0156 01/13/19 1850 01/14/19 0526  HGB 9.6* 9.3* 9.1*   Recent Labs    01/13/19 1850 01/14/19 0526  WBC 4.7 4.2  RBC 3.66* 3.41*  HCT 30.0* 28.0*  PLT 175 172   No results for input(s): NA, K, CL, CO2, BUN, CREATININE, GLUCOSE, CALCIUM in the last 72 hours. Recent Labs    01/13/19 0345 01/14/19 0526  INR 1.1 1.3*    Dorsiflexion/Plantar flexion intact Incision: dressing C/D/I Compartment soft   Assessment/Plan: 2 Days Post-Op Procedure(s) (LRB): LEFT GREAT TOE AMPUTATION (Left) Up with therapy touch down weight bearing left lower extremity      Rateel Beldin 01/14/2019, 7:44 AM

## 2019-01-14 NOTE — Progress Notes (Addendum)
Pt developed grade 3 phlebitis on his right forearm. IV removed. IV team consulted, assessed and marked the site.This RN notified on call triad hospitalist with no further orders noted. Warm packs applied to the site. New IV inserted on left hand and left forearm for antibiotics and heparin drip. Will continue to monitor.

## 2019-01-14 NOTE — Care Management (Signed)
Discussed d/c plan with patient.  Patient is uninsured and has not follow up with recommended avenues of assistance.    Pt declines home health and wheelchair as recommended by PT.  Pt is interested in a tub/shower seat and states he has a debit card to pay the $40 on delivery.  He will consider this and let CM know.  He states he cannot get foot wet at this time and may also wait until he can fully bathe to purchase this.   Reminded patient that he may need to save money for prescriptions written at d/c and to use [employer] or [employer] prescription plan.  Pt verbalized understanding.

## 2019-01-15 LAB — PROTIME-INR
INR: 1.3 — ABNORMAL HIGH (ref 0.8–1.2)
INR: 1.3 — ABNORMAL HIGH (ref 0.8–1.2)
Prothrombin Time: 16.1 seconds — ABNORMAL HIGH (ref 11.4–15.2)
Prothrombin Time: 16.4 seconds — ABNORMAL HIGH (ref 11.4–15.2)

## 2019-01-15 LAB — CBC
HCT: 28.9 % — ABNORMAL LOW (ref 39.0–52.0)
Hemoglobin: 9.2 g/dL — ABNORMAL LOW (ref 13.0–17.0)
MCH: 26.1 pg (ref 26.0–34.0)
MCHC: 31.8 g/dL (ref 30.0–36.0)
MCV: 81.9 fL (ref 80.0–100.0)
Platelets: 165 10*3/uL (ref 150–400)
RBC: 3.53 MIL/uL — ABNORMAL LOW (ref 4.22–5.81)
RDW: 15.6 % — ABNORMAL HIGH (ref 11.5–15.5)
WBC: 3.9 10*3/uL — ABNORMAL LOW (ref 4.0–10.5)
nRBC: 0.5 % — ABNORMAL HIGH (ref 0.0–0.2)

## 2019-01-15 LAB — HEPARIN LEVEL (UNFRACTIONATED): Heparin Unfractionated: 0.45 IU/mL (ref 0.30–0.70)

## 2019-01-15 MED ORDER — WARFARIN SODIUM 7.5 MG PO TABS
7.5000 mg | ORAL_TABLET | Freq: Once | ORAL | Status: AC
  Administered 2019-01-15: 7.5 mg via ORAL
  Filled 2019-01-15: qty 1

## 2019-01-15 NOTE — Plan of Care (Signed)
  Problem: Clinical Measurements: Goal: Ability to maintain clinical measurements within normal limits will improve Outcome: Progressing   Problem: Activity: Goal: Risk for activity intolerance will decrease Outcome: Progressing   Problem: Elimination: Goal: Will not experience complications related to bowel motility Outcome: Progressing   Problem: Pain Managment: Goal: General experience of comfort will improve Outcome: Progressing   Problem: Skin Integrity: Goal: Risk for impaired skin integrity will decrease Description Risk for impaired skin integrity will decrease, skin will remain C/D I  Outcome: Progressing

## 2019-01-15 NOTE — Plan of Care (Signed)
Pt with progressive behaviors for optimal outcomes

## 2019-01-15 NOTE — Progress Notes (Signed)
Occupational Therapy Treatment Patient Details Name: Leonard Bentley MRN: [id number] DOB: Jun 12, 1961 Today's Date: 01/15/2019    History of present illness Pt is 58 yo male who presents for L great toe amp due to osteomyelitis. PMH: EtOH abuse, HTN, Non Hodgkins, PE, Tachycardia, MVR.    OT comments  Patient seated EOB, verbalizes just completing bathing at sink, presents with increased fatigue and B UE tremors.  Pt reports tremors related to anxiety, "its happens sometimes". Reports pushing IV pole out of bathroom without assistance, and NOT using RW.  Also reports using the cane went well with PT, and reports plan to use it at home. Reviewed safety with seated ADLs, engaged in functional mobility with poor adherence to precautions and carryover of techniques after education.    Medi Cog completed: scoring 4/10, revealing concerns with cognition and medication mgmt at discharge (cut off score 8/10).  Patient able to recall 1/3 words, able to draw clock correctly, and able to manage 1/4 medications listed.  Pt able to understand task (completed example), but related medications listed to his own mediations and unable to process completion of task (even when given increased verbal cueing).  Encouraged patient to have support with medication mgmt at home, RN notified, and pt reports his brother may be able to assist. Further assessment of functional cognition will be beneficial using pill box test.   Discussed cognitive concerns, and patient reports "I just have a lot on my mind.".    Will follow.  Updated dc recommendations to [place], if able, pt declined when discussed with patient.         Follow Up Recommendations  Home health OT;Supervision - Intermittent(assist with medication mgmt)    Equipment Recommendations  Tub/shower bench    Recommendations for Other Services (home safety eval )    Precautions / Restrictions Precautions Precautions: Fall Required Braces or Orthoses: Other Brace Other  Brace: has post op shoe for both feet Restrictions Weight Bearing Restrictions: Yes LLE Weight Bearing: Touchdown weight bearing Other Position/Activity Restrictions: pt seeming to have a hard time understanding this, especially in regards to how it applies to function       Mobility Bed Mobility               General bed mobility comments: EOB upon entry  Transfers Overall transfer level: Needs assistance Equipment used: Rolling walker (2 wheeled) Transfers: Sit to/from Stand Sit to Stand: Supervision         General transfer comment: cues for safe hand placement; supervision for safety and to maintain weight bearing status    Balance Overall balance assessment: Needs assistance   Sitting balance-Leahy Scale: Good       Standing balance-Leahy Scale: Poor Standing balance comment: reliant on external support                           ADL either performed or assessed with clinical judgement   ADL Overall ADL's : Needs assistance/impaired                     Lower Body Dressing: Modified independent Lower Body Dressing Details (indicate cue type and reason): donned B shoes with no assist Toilet Transfer: Modified Independent;Ambulation;RW Toilet Transfer Details (indicate cue type and reason): safety concerns-poor adherance to precautions; with poor carryover of technique to follow precautions          Functional mobility during ADLs: Rolling walker;Supervision/safety;Cueing for safety General ADL  Comments: reviewed energy conservation techniques and weightbearing precaution techniques, pt with poor adherance and fatigues easily during all activities in standing.  reviewed having assist for medication mgmt using pill box, reports his brother could probably assist him.      Vision       Perception     Praxis      Cognition Arousal/Alertness: Awake/alert Behavior During Therapy: WFL for tasks assessed/performed Overall Cognitive  Status: No family/caregiver present to determine baseline cognitive functioning                                 General Comments: pt with increased time for processing, poor problem sovling, and difficulty maintaining precautions; medi cog completed- see general comments   Medi Cog completed: scoring 4/10, revealing concerns with cognition and medication mgmt at discharge (cut off score 8/10).  Patient able to recall 1/3 words, able to draw clock correctly, and able to manage 1/4 medications listed.  Pt able to understand task (completed example), but related medications listed to his own mediations and unable to process completion of task (even when given increased verbal cueing).  Encouraged patient to have support with medication mgmt at home, RN notified, and pt reports his brother may be able to assist.           Exercises     Shoulder Instructions       General Comments      Pertinent Vitals/ Pain       Pain Assessment: Faces Faces Pain Scale: Hurts a little bit Pain Location: both feet Pain Descriptors / Indicators: Aching Pain Intervention(s): Limited activity within patient's tolerance;Monitored during session;Repositioned  Home Living                                          Prior Functioning/Environment              Frequency  Min 2X/week        Progress Toward Goals  OT Goals(current goals can now be found in the care plan section)  Progress towards OT goals: Not progressing toward goals - comment  Acute Rehab OT Goals Patient Stated Goal: get back to martial arts OT Goal Formulation: With patient Time For Goal Achievement: 01/20/19 Potential to Achieve Goals: Good  Plan Frequency remains appropriate;Discharge plan needs to be updated    Co-evaluation                 AM-PAC OT "6 Clicks" Daily Activity     Outcome Measure   Help from another person eating meals?: None Help from another person taking care of  personal grooming?: None Help from another person toileting, which includes using toliet, bedpan, or urinal?: None Help from another person bathing (including washing, rinsing, drying)?: None Help from another person to put on and taking off regular upper body clothing?: None Help from another person to put on and taking off regular lower body clothing?: None 6 Click Score: 24    End of Session Equipment Utilized During Treatment: Gait belt;Rolling walker;Other (comment)(post op shoes)  OT Visit Diagnosis: Pain;Other abnormalities of gait and mobility (R26.89) Pain - part of body: (feet)   Activity Tolerance Patient tolerated treatment well   Patient Left in bed;with call bell/phone within reach   Nurse Communication Mobility status;Precautions  Time: 9147-8295 OT Time Calculation (min): 25 min  Charges: OT General Charges $OT Visit: 1 Visit OT Treatments $Self Care/Home Management : 8-22 mins $Cognitive Funtion inital: Initial 15 mins  Delight Stare, OT Acute Rehabilitation Services Pager [phone] Office [phone]    Delight Stare 01/15/2019, 2:15 PM

## 2019-01-15 NOTE — Progress Notes (Signed)
PROGRESS NOTE    Leonard Bentley  ZHG:[id number] DOB: November 25, 1960 DOA: 01/09/2019 PCP: [name], MD   Brief Narrative:  This is a 58 year old African-American male with medical history significant for recurrent PEs, hypertension, alcoholism, tobacco abuse and peripheral vascular disease as well as non-Hodgkin's lymphoma who was sent to the ER by orthopedics acute osteomyelitis of big toe.  He was seen and patient underwent left big toe amputation.  Remains in the hospital as we bridge with heparin till his Coumadin becomes therapeutic.  As patient reported he has limited financial resources and limited ability to afford NOAC long term   Assessment & Plan:   Principal Problem:   Osteomyelitis of great toe of left foot (HCC) Active Problems:   History of non-Hodgkin's lymphoma   Alcohol abuse   Cigarette smoker   SVT (supraventricular tachycardia) (HCC)   Recurrent pulmonary emboli (HCC)   HTN (hypertension)   #1 osteomyelitis of the left big toenow postop day3:Patient has failed outpatient antibiotic treatment.He will remain inpatient,Blood cultures have been obtainedandare negative,. Orthopedics saw in consultation with surgery March 6.With subsequent left big toe amputation. No intraoperative complications, cleared to restart anticoagulation.  #2 history of pulmonary embolism:Preoperatively patient was onCoumadin and subtherapeutic. With surgery planned, admitting physiciandiscontinuedwarfarin.and InitiatedIV heparinon admission,heparin was stopped for surgery. Because of history of recurrent PEs bilateral and multiple patient will be restarted on Coumadin with bridge. Patient reported inability to afford additional long-term anticoagulants so we will continue with Coumadin.inr 1.3, rechecking 2pm  #3 alcohol abuse: No evidence of acute withdrawal, as previously notedpatient denied taking alcohol PTA although he has had recent DTs. We willcontinue withCIWA  protocol empirically. In the meantime continue monitoring, to this point no evidence of withdrawals  #4 tobacco abuse:Nicotine patch will becontinuedwith tobacco cessation counseling.  #5 hypertension:Continue blood pressure control.  #6 supraventricular tachycardia:Most likely secondary to alcohol withdrawals. Patient is currently stable. He has chronic SVT.  DVT prophylaxis: HD:[id number] and Heparin drip  Code Status: full    Code Status Orders  (From admission, onward)         Start     Ordered   01/09/19 2321  Full code  Continuous     01/09/19 2320        Code Status History    Date Active Date Inactive Code Status Order ID Comments User Context   12/27/2018 1619 12/31/2018 1428 Full Code [id number]  Oswald Hillock, MD ED   12/07/2018 2253 12/10/2018 1723 Full Code [id number]  [name], MD ED   11/27/2018 0528 11/28/2018 1548 Full Code [id number]  [name], MD ED   11/02/2018 0135 11/08/2018 1511 Full Code [id number]  [name], MD ED   11/02/2018 0053 11/02/2018 0135 Full Code [id number]  [name], MD ED   08/30/2018 2305 09/01/2018 0042 Full Code [id number]  [name], MD Inpatient   08/30/2018 1620 08/30/2018 2305 Full Code [id number]  [name], PA-C ED   07/25/2018 1557 07/30/2018 1714 Full Code [id number]  [name], MD ED   06/04/2018 2302 06/13/2018 0204 Full Code [id number]  [name], MD Inpatient   11/27/2016 2247 11/28/2016 1715 Full Code [id number]  [name], MD Inpatient   10/27/2016 1420 10/31/2016 1519 Full Code [id number]  [name], MD Inpatient   08/13/2016 0855 08/13/2016 1559 Full Code [id number]  [name], MD Inpatient   04/02/2016 1736 04/03/2016 1820 Full Code [id number]  [name], NP Inpatient   01/31/2013 [phone]  02/02/2013 1800 Full Code [id number]  [name], MD Inpatient   01/30/2013 2133 01/31/2013 0615 Full Code [id number]  [name], MD ED   12/28/2012  1235 01/01/2013 1336 Full Code [id number]  [name], MD ED   08/24/2012 0423 08/25/2012 1426 Full Code [id number]  [name]., DO ED     Family Communication: None present Disposition Plan:   Patient remained inpatient secondary to IV heparin required as bridging to Coumadin is therapeutic in this patient with history of recurrent multiple PEs and the inability to maintain treatment from a financial standpoint with NOACs Consults called: None Admission status: Inpatient   Consultants:   ortho  Procedures:  Dg Hand 2 View Left  Result Date: 12/28/2018 CLINICAL DATA:  Distal left index finger pain following a crush injury 3 weeks ago. EXAM: LEFT HAND - 2 VIEW COMPARISON:  None. FINDINGS: There is no evidence of fracture or dislocation. There is no evidence of arthropathy or other focal bone abnormality. Soft tissues are unremarkable. IMPRESSION: Negative. Electronically Signed   By: Claudie Revering M.D.   On: 12/28/2018 12:44   Mr Foot Left [name]  Result Date: 12/27/2018 CLINICAL DATA:  Worsening infection of the left great toe despite antibiotics. EXAM: MRI OF THE LEFT FOREFOOT WITHOUT AND WITH CONTRAST TECHNIQUE: Multiplanar, multisequence MR imaging of the left foot was performed both before and after administration of intravenous contrast. CONTRAST:  8 mm mild Gadavist IV COMPARISON:  Left foot radiographs 12/07/2018 and great toe radiographs 12/27/2018 FINDINGS: Bones/Joint/Cartilage Subtle cortical bone loss along the dorsal medial aspect of the distal phalanx of the left great toe, series 4/11. Marrow edema of the distal phalanx concerning for early changes of osteomyelitis are noted, series 8/15. No acute fracture joint dislocations. Subcortical cyst involving the lateral cuneiform is identified measuring approximately 8 x 5 x 6 mm. Ligaments Negative Muscles and Tendons No evidence of pyomyositis. Mild intramuscular edema likely neurogenic. Soft tissues Small ganglion cyst  along the dorsum of the first proximal phalanx measuring approximately 11 x 6 x 9 mm. Diffuse soft tissue edema is noted consistent with cellulitis. IMPRESSION: 1. Diffuse subcutaneous soft tissue edema the forefoot consistent with history of cellulitis. 2. Subtle cortical bone loss along the dorsal medial aspect of the distal phalanx of the great toe more uniform marrow edema of the distal phalanx raising concern for changes of osteomyelitis. 3. Small ganglion cyst along the dorsum the first proximal phalanx measuring 11 x 6 x 9 mm. 4. Degenerative subcortical cyst of the lateral cuneiform measuring 8 x 5 x 6 mm. Electronically Signed   By: Ashley Royalty M.D.   On: 12/27/2018 21:28   Dg Foot Complete Left  Result Date: 01/09/2019 CLINICAL DATA:  Great toe wound for 4 weeks. Pain and infection. EXAM: LEFT FOOT - COMPLETE 3+ VIEW COMPARISON:  Great toe radiographs and forefoot MRI 12/27/2018 FINDINGS: No bony destructive change. Equivocal decreased density involving the lateral aspect of the great toe distal tuft at site of osteomyelitis on prior MRI. No periosteal reaction or frank osseous erosions. No soft tissue air or radiopaque foreign body. Soft tissue thickening of the digit. Hammertoe deformity of the second through fifth digits. No findings elsewhere to suggest osteomyelitis. Well corticated density about the dorsal talus may represent sequela of remote prior injury or accessory ossicle. IMPRESSION: Equivocal decreased density involving the lateral aspect of the great toe distal tuft at site of osteomyelitis on prior MRI. No frank bony destructive  change. No soft tissue air or radiopaque foreign body. Electronically Signed   By: Keith Rake M.D.   On: 01/09/2019 19:07   Dg Toe Great Left  Result Date: 12/27/2018 CLINICAL DATA:  necrotic great toe, please eval for possible osteomyelitis/gasPatient reports pain in left great toe for 3 weeks. Patient states it started as blister along dorsal surface  of great toe 3 weeks ago, he placed a band-aid on it and 2 weeks ago his distal great toe started to turn black. Pain is localized to great toe and does not radiate into rest of foot. No prior surgery or injury to great toe. EXAM: LEFT GREAT TOE COMPARISON:  12/07/2018 FINDINGS: There is no evidence of fracture or dislocation. No focal cortical erosion to suggest osteomyelitis. There is no evidence of arthropathy or other focal bone abnormality. Soft tissue swelling about the distal phalanx. No subcutaneous gas or radiodense foreign body. IMPRESSION: 1. Soft tissue swelling without bony abnormality. 2. No radiographic evidence of osteomyelitis. The Electronically Signed   By: Lucrezia Europe M.D.   On: 12/27/2018 14:55     Antimicrobials:   Cefepime completed abx   Subjective: Resting in bed comfortably, ambulating more, still with moderate pain,  Objective: Vitals:   01/14/19 1402 01/14/19 1509 01/14/19 1933 01/15/19 0629  BP: (!) 125/100 (!) 130/97 (!) 134/99 (!) 137/104  Pulse: 69 75 68 62  Resp:  16  18  Temp: 98.3 F (36.8 C) 97.6 F (36.4 C) 97.8 F (36.6 C) (!) 97.3 F (36.3 C)  TempSrc: Oral Oral Oral Oral  SpO2: 100% 100% 100% 100%  Weight:      Height:        Intake/Output Summary (Last 24 hours) at 01/15/2019 1238 Last data filed at 01/15/2019 0800 Gross per 24 hour  Intake 2236.29 ml  Output 600 ml  Net 1636.29 ml   Filed Weights   01/11/19 1637  Weight: 84.8 kg    Examination:  General exam: Appears calm and comfortable  Respiratory system: Clear to auscultation. Respiratory effort normal. Cardiovascular system: S1 & S2 heard, RRR. No JVD, murmurs, rubs, gallops or clicks. No pedal edema. Gastrointestinal system: Abdomen is nondistended, soft and nontender. No organomegaly or masses felt. Normal bowel sounds heard. Central nervous system: Alert and oriented. No focal neurological deficits. Extremities: Symmetric 5 x 5 power, lower extremity wrapping, distally  neurovascularly intact.  Moving all 4 extremities freely without limitation. Skin: No rashes, lesions or ulcers Psychiatry: Judgement and insight appear normal. Mood & affect appropriate.     Data Reviewed: I have personally reviewed following labs and imaging studies  CBC: Recent Labs  Lab 01/09/19 1807 01/10/19 0123 01/12/19 0156 01/13/19 1850 01/14/19 0526 01/15/19 0333  WBC 3.9* 3.0* 3.6* 4.7 4.2 3.9*  NEUTROABS 2.5  --   --   --   --   --   HGB 11.3* 10.4* 9.6* 9.3* 9.1* 9.2*  HCT 34.7* 32.2* 30.0* 30.0* 28.0* 28.9*  MCV 81.3 79.5* 82.0 82.0 82.1 81.9  PLT 251 217 174 175 172 093   Basic Metabolic Panel: Recent Labs  Lab 01/09/19 1807 01/10/19 0123  NA 138 136  K 4.3 4.2  CL 103 104  CO2 26 25  GLUCOSE 98 103*  BUN 14 12  CREATININE 0.85 0.96  CALCIUM 9.3 8.8*   GFR: Estimated Creatinine Clearance: 82.9 mL/min (by C-G formula based on SCr of 0.96 mg/dL). Liver Function Tests: Recent Labs  Lab 01/10/19 0123  AST 38  ALT  25  ALKPHOS 41  BILITOT 1.1  PROT 5.9*  ALBUMIN 3.2*   No results for input(s): LIPASE, AMYLASE in the last 168 hours. No results for input(s): AMMONIA in the last 168 hours. Coagulation Profile: Recent Labs  Lab 01/10/19 0123 01/13/19 0345 01/14/19 0526 01/15/19 0333  INR 1.5* 1.1 1.3* 1.3*   Cardiac Enzymes: No results for input(s): CKTOTAL, CKMB, CKMBINDEX, TROPONINI in the last 168 hours. BNP (last 3 results) No results for input(s): PROBNP in the last 8760 hours. HbA1C: No results for input(s): HGBA1C in the last 72 hours. CBG: Recent Labs  Lab 01/12/19 1014  GLUCAP 87   Lipid Profile: No results for input(s): CHOL, HDL, LDLCALC, TRIG, CHOLHDL, LDLDIRECT in the last 72 hours. Thyroid Function Tests: No results for input(s): TSH, T4TOTAL, FREET4, T3FREE, THYROIDAB in the last 72 hours. Anemia Panel: No results for input(s): VITAMINB12, FOLATE, FERRITIN, TIBC, IRON, RETICCTPCT in the last 72 hours. Sepsis  Labs: Recent Labs  Lab 01/09/19 1827  LATICACIDVEN 1.4    Recent Results (from the past 240 hour(s))  Culture, blood (routine x 2)     Status: None   Collection Time: 01/09/19  6:27 PM  Result Value Ref Range Status   Specimen Description   Final    BLOOD LEFT ANTECUBITAL Performed at [facility] [address], [place], [place] [address]    Special Requests   Final    BOTTLES DRAWN AEROBIC AND ANAEROBIC Blood Culture adequate volume Performed at [facility] [address], [place], [place] [address]    Culture   Final    NO GROWTH 5 DAYS Performed at [facility], [place] [address] [place], [place] [address]    Report Status 01/14/2019 FINAL  Final  Culture, blood (routine x 2)     Status: None   Collection Time: 01/09/19  6:50 PM  Result Value Ref Range Status   Specimen Description   Final    BLOOD LEFT ANTECUBITAL Performed at [facility] [address], [place], [place] [address]    Special Requests   Final    BOTTLES DRAWN AEROBIC AND ANAEROBIC Blood Culture adequate volume Performed at [facility] [address], [place], [place] [address]    Culture   Final    NO GROWTH 5 DAYS Performed at [facility], [place] [address], [place],  [address]    Report Status 01/14/2019 FINAL  Final  Surgical PCR screen     Status: None   Collection Time: 01/12/19  7:25 AM  Result Value Ref Range Status   MRSA, PCR NEGATIVE NEGATIVE Final   Staphylococcus aureus NEGATIVE NEGATIVE Final    Comment: (NOTE) The Xpert SA Assay (FDA approved for NASAL specimens in patients 29 years of age and older), is one component of a comprehensive surveillance program. It is not intended to diagnose infection nor to guide or monitor treatment. Performed at [facility], [place] [address], [place],  [address]          Radiology Studies: No results found.      Scheduled  Meds: . calcium carbonate  1 tablet Oral TID  . docusate sodium  100 mg Oral BID  . feeding supplement (ENSURE ENLIVE)  237 mL Oral BID BM  . feeding supplement (PRO-STAT SUGAR FREE 64)  30 mL Oral BID  . mupirocin ointment  1 application Nasal BID  . nicotine  21 mg Transdermal Daily  .  thiamine  100 mg Oral Daily   Or  . thiamine  100 mg Intravenous Daily  . warfarin  7.5 mg Oral ONCE-1800  . Warfarin - Pharmacist Dosing Inpatient   Does not apply q1800   Continuous Infusions: . sodium chloride 75 mL/hr at 01/11/19 1718  . sodium chloride 10 mL/hr at 01/12/19 1125  . sodium chloride 10 mL/hr at 01/12/19 1545  . heparin 1,600 Units/hr (01/14/19 2128)  . lactated ringers 10 mL/hr at 01/12/19 0838  . methocarbamol (ROBAXIN) IV       LOS: 6 days    Time spent: 63 min    Nicolette Bang, MD [facility]  If 7PM-7AM, please contact night-coverage  01/15/2019, 12:38 PM

## 2019-01-15 NOTE — Progress Notes (Signed)
Ok to dc cefepime per Dr. Epifania Gore, PharmD, BCIDP, AAHIVP, CPP Infectious Disease Pharmacist 01/15/2019 9:54 AM

## 2019-01-15 NOTE — Progress Notes (Signed)
[name] for warfarin with IV Heparin bridgeIndication: hx recurrent PE  Allergies  Allergen Reactions  . Other Other (See Comments)    "Cactus" blisters on tongue    Patient Measurements: Height: 5\' 9"  (175.3 cm) Weight: 187 lb (84.8 kg) IBW/kg (Calculated) : 70.7  Vital Signs: Temp: 97.3 F (36.3 C) (03/09 0629) Temp Source: Oral (03/09 0629) BP: 137/104 (03/09 0629) Pulse Rate: 62 (03/09 0629)  Labs: Recent Labs    01/13/19 0345  01/13/19 1850 01/14/19 0526 01/14/19 1309 01/15/19 0333  HGB  --    < > 9.3* 9.1*  --  9.2*  HCT  --   --  30.0* 28.0*  --  28.9*  PLT  --   --  175 172  --  165  LABPROT 13.6  --   --  15.6*  --  16.1*  INR 1.1  --   --  1.3*  --  1.3*  HEPARINUNFRC  --    < > 0.25* 0.50 0.69 0.45   < > = values in this interval not displayed.    Estimated Creatinine Clearance: 82.9 mL/min (by C-G formula based on SCr of 0.96 mg/dL).   Assessment: 56 YOM with h/o recurrent PE who presented to [facility] with osteomyelitis of left big toe. Patient was on warfarin at home which was discontinued for surgery.  s/p left great toe amputation on 01/12/19. Pharmacy consulted to resume warfarin 3/6.  Restarted IV heparin bridge 01/13/19, no bolus.   Heparin level = 0.45, therapeutic this AM on heparin drip 1600 units/hr.  INR = 1.3 Hgb low/stable at 9.2 and pltc wnl. No bleeding noted.  PTA: 5 mg daily  Goal of Therapy:  Heparin level = 0.3-0.7 units/ml INR 2-3 Monitor platelets by anticoagulation protocol: Yes   Plan:  Continue IV heparin drip 1600 units/hr, no bolus. Daily hep lvl, cbc, INR Warfarin 7.5 mg today x1  Alanda Slim, PharmD, [facility] Clinical Pharmacist Please see AMION for all Pharmacists' Contact Phone Numbers 01/15/2019, 10:04 AM

## 2019-01-15 NOTE — Progress Notes (Signed)
Overheard patient's bed alarm going off. When I entered patient's room, he requested for his bed alarm to be turned off. Per patient bed alarm make his anxious. Bed alarm turned off per patient's request. I educated this patient on  falls precaution  and the importance of calling for help before getting out of bed.  Patient verbalizes understanding

## 2019-01-15 NOTE — Progress Notes (Signed)
Physical Therapy Treatment Patient Details Name: Leonard Bentley MRN: [id number] DOB: [birth year] Today's Date: 01/15/2019    History of Present Illness Pt is 58 yo male who presents for L great toe amp due to osteomyelitis. PMH: EtOH abuse, HTN, Non Hodgkins, PE, Tachycardia, MVR.     PT Comments    Patient seen for mobility progression. Pt continues to require mod/max cues for maintaining TDWB status L LE. Pt has difficulty maintaining precautions and up in room without AD upon arrival. Pt educated on importance of maintaining precautions and to use AD when OOB. Pt reports that he will likely not be able to use RW in home due to "clutter". Attempted gait training using SPC however pt does require increased assist for balance without bilat UE support. Pt will benefit from further skilled PT services to maximize independence and safety with mobility.     Follow Up Recommendations  Home health PT;Other (comment)(if able to get and pt agrees)     Equipment Recommendations  Rolling walker with 5" wheels;Other (comment)(tub transfer bench per OT)    Recommendations for Other Services       Precautions / Restrictions Precautions Precautions: Fall Required Braces or Orthoses: Other Brace Other Brace: has post op shoe for both feet Restrictions Weight Bearing Restrictions: Yes LLE Weight Bearing: Touchdown weight bearing Other Position/Activity Restrictions: pt seeming to have a hard time understanding this, especially in regards to how it applies to function    Mobility  Bed Mobility Overal bed mobility: Independent             General bed mobility comments: pt up in room without AD upon arrival; pt educated on importance of maintaining TDWB precautions and using RW   Transfers Overall transfer level: Needs assistance Equipment used: Rolling walker (2 wheeled) Transfers: Sit to/from Stand Sit to Stand: Supervision         General transfer comment: cues for safe hand  placement; supervision for safety and to maintain weight bearing status  Ambulation/Gait Ambulation/Gait assistance: Min guard;Min assist;Mod assist Gait Distance (Feet): (100 ft total ) Assistive device: Rolling walker (2 wheeled) Gait Pattern/deviations: Decreased stance time - left;Decreased step length - right;Decreased step length - left;Decreased weight shift to left;Antalgic Gait velocity: decreased   General Gait Details: cues for sequencing and technique to maintain TDWB; needs mod/max cues for weight bearing status; pt reports not being able to fit RW around house and requested to use cane for gait training; pt requires min/mod A for balance using single UE support for short distance; pt really is more steady with bilat UE support     Stairs   Stairs assistance: Min guard Stair Management: One rail Left;Step to pattern;Forwards;With cane Number of Stairs: (3 steps X 2 trials ) General stair comments: cues for sequencing and technique using L hand rail and SPC; cues for use of bilat UE support to [employer] L LE   Wheelchair Mobility    Modified Rankin (Stroke Patients Only)       Balance Overall balance assessment: Needs assistance   Sitting balance-Leahy Scale: Good       Standing balance-Leahy Scale: Poor                              Cognition Arousal/Alertness: Awake/alert Behavior During Therapy: WFL for tasks assessed/performed Overall Cognitive Status: No family/caregiver present to determine baseline cognitive functioning  General Comments: pt with increased time for processing, poor problem sovling, and difficulty maintaining precautions      Exercises      General Comments General comments (skin integrity, edema, etc.): pt educated on keeping L LE elevated when resting to decrease swelling       Pertinent Vitals/Pain Pain Assessment: Faces Faces Pain Scale: Hurts a little bit Pain  Location: both feet Pain Descriptors / Indicators: Aching Pain Intervention(s): Limited activity within patient's tolerance;Monitored during session;Repositioned    Home Living                      Prior Function            PT Goals (current goals can now be found in the care plan section) Progress towards PT goals: Progressing toward goals    Frequency    Min 3X/week      PT Plan Current plan remains appropriate    Co-evaluation              AM-PAC PT "6 Clicks" Mobility   Outcome Measure  Help needed turning from your back to your side while in a flat bed without using bedrails?: None Help needed moving from lying on your back to sitting on the side of a flat bed without using bedrails?: None Help needed moving to and from a bed to a chair (including a wheelchair)?: None Help needed standing up from a chair using your arms (e.g., wheelchair or bedside chair)?: None Help needed to walk in hospital room?: A Little Help needed climbing 3-5 steps with a railing? : A Little 6 Click Score: 22    End of Session Equipment Utilized During Treatment: Gait belt Activity Tolerance: Patient tolerated treatment well Patient left: in bed;with call bell/phone within reach Nurse Communication: Mobility status PT Visit Diagnosis: Pain;Difficulty in walking, not elsewhere classified (R26.2) Pain - Right/Left: Left Pain - part of body: Ankle and joints of foot     Time: 0240-9735 PT Time Calculation (min) (ACUTE ONLY): 35 min  Charges:  $Gait Training: 23-37 mins                     Earney Navy, PTA Acute Rehabilitation Services Pager: [phone] Office: [phone]     Darliss Cheney 01/15/2019, 9:59 AM

## 2019-01-15 NOTE — Progress Notes (Signed)
Patient ID: Leonard Bentley, male   DOB: 1961/08/28, 58 y.o.   MRN: [id number] Patient is postoperative Torti 3 left great toe amputation.  Patient does complain of pain.  Examination the dressing is clean and dry no complicating features no ulcers on the right foot.  Patient can discharge to home once he is safe with therapy minimizing weightbearing on the left foot.

## 2019-01-16 ENCOUNTER — Encounter (HOSPITAL_COMMUNITY): Payer: Self-pay | Admitting: *Deleted

## 2019-01-16 LAB — PROTIME-INR
INR: 1.4 — ABNORMAL HIGH (ref 0.8–1.2)
Prothrombin Time: 17.3 seconds — ABNORMAL HIGH (ref 11.4–15.2)

## 2019-01-16 LAB — CBC
HCT: 29.9 % — ABNORMAL LOW (ref 39.0–52.0)
Hemoglobin: 9.4 g/dL — ABNORMAL LOW (ref 13.0–17.0)
MCH: 25.6 pg — ABNORMAL LOW (ref 26.0–34.0)
MCHC: 31.4 g/dL (ref 30.0–36.0)
MCV: 81.5 fL (ref 80.0–100.0)
Platelets: 190 10*3/uL (ref 150–400)
RBC: 3.67 MIL/uL — ABNORMAL LOW (ref 4.22–5.81)
RDW: 15.6 % — ABNORMAL HIGH (ref 11.5–15.5)
WBC: 3.9 10*3/uL — ABNORMAL LOW (ref 4.0–10.5)
nRBC: 0.8 % — ABNORMAL HIGH (ref 0.0–0.2)

## 2019-01-16 LAB — HEPARIN LEVEL (UNFRACTIONATED): Heparin Unfractionated: 0.39 IU/mL (ref 0.30–0.70)

## 2019-01-16 MED ORDER — WARFARIN SODIUM 5 MG PO TABS
10.0000 mg | ORAL_TABLET | Freq: Once | ORAL | Status: AC
  Administered 2019-01-16: 10 mg via ORAL
  Filled 2019-01-16: qty 2

## 2019-01-16 NOTE — Progress Notes (Signed)
[place] for warfarin with IV Heparin bridgeIndication: hx recurrent PE  Allergies  Allergen Reactions  . Other Other (See Comments)    "Cactus" blisters on tongue    Patient Measurements: Height: 5\' 9"  (175.3 cm) Weight: 187 lb (84.8 kg) IBW/kg (Calculated) : 70.7  Vital Signs: Temp: 98.9 F (37.2 C) (03/10 0900) Temp Source: Oral (03/10 0900) BP: 126/97 (03/10 0421) Pulse Rate: 74 (03/10 0900)  Labs: Recent Labs    01/14/19 0526 01/14/19 1309 01/15/19 0333 01/15/19 1342 01/16/19 0306  HGB 9.1*  --  9.2*  --  9.4*  HCT 28.0*  --  28.9*  --  29.9*  PLT 172  --  165  --  190  LABPROT 15.6*  --  16.1* 16.4* 17.3*  INR 1.3*  --  1.3* 1.3* 1.4*  HEPARINUNFRC 0.50 0.69 0.45  --  0.39    Estimated Creatinine Clearance: 82.9 mL/min (by C-G formula based on SCr of 0.96 mg/dL).   Assessment: 53 YOM with h/o recurrent PE who presented to [facility] with osteomyelitis of left big toe. Patient was on warfarin at home which was discontinued for surgery.  s/p left great toe amputation on 01/12/19. Pharmacy consulted to resume warfarin 3/6.  Restarted IV heparin bridge 01/13/19, no bolus.   Heparin level = 0.39, therapeutic this AM on heparin drip 1600 units/hr.  INR = 1.4 Hgb low/stable at 9.2 and pltc wnl. No bleeding noted.  PTA: 5 mg daily  Goal of Therapy:  Heparin level = 0.3-0.7 units/ml INR 2-3 Monitor platelets by anticoagulation protocol: Yes   Plan:  Continue IV heparin drip 1600 units/hr, no bolus. Daily hep lvl, cbc, INR Warfarin 10 mg today x 1  Alanda Slim, PharmD, [facility] Clinical Pharmacist Please see AMION for all Pharmacists' Contact Phone Numbers 01/16/2019, 9:18 AM

## 2019-01-16 NOTE — Progress Notes (Signed)
PROGRESS NOTE    Leonard Bentley  HMC:[id number] DOB: Aug 05, 1961 DOA: 01/09/2019 PCP: [name], MD   Brief Narrative:  This is a 58 year old African-American male with medical history significant for recurrent PEs, hypertension, alcoholism, tobacco abuse and peripheral vascular disease as well as non-Hodgkin's lymphoma who was sent to the ER by orthopedics acute osteomyelitis of big toe.  He was seen and patient underwent left big toe amputation.  Remains in the hospital as we bridge with heparin till his Coumadin becomes therapeutic.  As patient reported he has limited financial resources and no ability to afford NOAC long term which will be required in this patient with recurrent lateral and multiple PEs   Assessment & Plan:   Principal Problem:   Osteomyelitis of great toe of left foot (HCC) Active Problems:   History of non-Hodgkin's lymphoma   Alcohol abuse   Cigarette smoker   SVT (supraventricular tachycardia) (HCC)   Recurrent pulmonary emboli (HCC)   HTN (hypertension)   #1 osteomyelitis of the left big toenow postop day4:Patient had failed outpatient antibiotic treatment.Blood cultures have been obtainedandare negative,. Orthopedics saw in consultation with surgery March 6 withsubsequent left big toe amputation. No intraoperative complications, cleared to restart anticoagulation.  #2 history of pulmonary embolism:Preoperatively patient was onCoumadin and subtherapeutic. With surgery planned, admitting physiciandiscontinuedwarfarin.and InitiatedIV heparinon admission,heparin was stopped for surgery. Because of history of recurrent PEs bilateral and multiple patient will be restarted on Coumadin with bridge. Patient reported inability to afford additional long-term anticoagulants secondary to no payer source, we will continue with Coumadin.inr 1.4 today.  Pharmacy increasing Coumadin dosing tonight.  #3 alcohol abuse: No evidence of acute withdrawal, as  previously notedpatient denied taking alcohol PTA although he has had recent DTs. We willcontinue withCIWA protocol empirically. In the meantime continue monitoring, to this point no evidence of withdrawals  #4 tobacco abuse:Nicotine patch will becontinuedwith tobacco cessation counseling.  #5 hypertension:Continue blood pressure control.  #6 supraventricular tachycardia:Most likely secondary to alcohol withdrawals. Patient is currently stable. He has chronic SVT.  Medical decision making.  Patient will remain in the hospital as an inpatient for continued management of complex medical problems to include the following -Recurrent bilateral PEs requiring heparin bridge to Coumadin is therapeutic.  A complicating factor is patient has no payer source for long-term management with alternate oral anticoagulants -Continued wound care on left big toe amputation.  DVT prophylaxis: MO:[id number]  Code Status: Full code    Code Status Orders  (From admission, onward)         Start     Ordered   01/09/19 2321  Full code  Continuous     01/09/19 2320        Code Status History    Date Active Date Inactive Code Status Order ID Comments User Context   12/27/2018 1619 12/31/2018 1428 Full Code [id number]  Oswald Hillock, MD ED   12/07/2018 2253 12/10/2018 1723 Full Code [id number]  [name], MD ED   11/27/2018 0528 11/28/2018 1548 Full Code [id number]  [name], MD ED   11/02/2018 0135 11/08/2018 1511 Full Code [id number]  [name], MD ED   11/02/2018 0053 11/02/2018 0135 Full Code [id number]  [name], MD ED   08/30/2018 2305 09/01/2018 0042 Full Code [id number]  [name], MD Inpatient   08/30/2018 1620 08/30/2018 2305 Full Code [id number]  [name], PA-C ED   07/25/2018 1557 07/30/2018 1714 Full Code [id number]  [name], MD ED  06/04/2018 2302 06/13/2018 0204 Full Code [id number]  [name], MD Inpatient   11/27/2016 2247 11/28/2016  1715 Full Code [id number]  [name], MD Inpatient   10/27/2016 1420 10/31/2016 1519 Full Code [id number]  [name], MD Inpatient   08/13/2016 0855 08/13/2016 1559 Full Code [id number]  [name], MD Inpatient   04/02/2016 1736 04/03/2016 1820 Full Code [id number]  [name], NP Inpatient   01/31/2013 0615 02/02/2013 1800 Full Code [id number]  [name], MD Inpatient   01/30/2013 2133 01/31/2013 0615 Full Code [id number]  [name], MD ED   12/28/2012 1235 01/01/2013 1336 Full Code [id number]  [name], MD ED   08/24/2012 0423 08/25/2012 1426 Full Code [id number]  [name]., DO ED     Family Communication: None present Disposition Plan:   Patient remained inpatient for management of complex medical problems as noted above. Consults called: None today Admission status: Inpatient   Consultants:   Orthopedics  Procedures:  Dg Hand 2 View Left  Result Date: 12/28/2018 CLINICAL DATA:  Distal left index finger pain following a crush injury 3 weeks ago. EXAM: LEFT HAND - 2 VIEW COMPARISON:  None. FINDINGS: There is no evidence of fracture or dislocation. There is no evidence of arthropathy or other focal bone abnormality. Soft tissues are unremarkable. IMPRESSION: Negative. Electronically Signed   By: Claudie Revering M.D.   On: 12/28/2018 12:44   Mr Foot Left [name]  Result Date: 12/27/2018 CLINICAL DATA:  Worsening infection of the left great toe despite antibiotics. EXAM: MRI OF THE LEFT FOREFOOT WITHOUT AND WITH CONTRAST TECHNIQUE: Multiplanar, multisequence MR imaging of the left foot was performed both before and after administration of intravenous contrast. CONTRAST:  8 mm mild Gadavist IV COMPARISON:  Left foot radiographs 12/07/2018 and great toe radiographs 12/27/2018 FINDINGS: Bones/Joint/Cartilage Subtle cortical bone loss along the dorsal medial aspect of the distal phalanx of the left great toe, series 4/11. Marrow edema of the  distal phalanx concerning for early changes of osteomyelitis are noted, series 8/15. No acute fracture joint dislocations. Subcortical cyst involving the lateral cuneiform is identified measuring approximately 8 x 5 x 6 mm. Ligaments Negative Muscles and Tendons No evidence of pyomyositis. Mild intramuscular edema likely neurogenic. Soft tissues Small ganglion cyst along the dorsum of the first proximal phalanx measuring approximately 11 x 6 x 9 mm. Diffuse soft tissue edema is noted consistent with cellulitis. IMPRESSION: 1. Diffuse subcutaneous soft tissue edema the forefoot consistent with history of cellulitis. 2. Subtle cortical bone loss along the dorsal medial aspect of the distal phalanx of the great toe more uniform marrow edema of the distal phalanx raising concern for changes of osteomyelitis. 3. Small ganglion cyst along the dorsum the first proximal phalanx measuring 11 x 6 x 9 mm. 4. Degenerative subcortical cyst of the lateral cuneiform measuring 8 x 5 x 6 mm. Electronically Signed   By: Ashley Royalty M.D.   On: 12/27/2018 21:28   Dg Foot Complete Left  Result Date: 01/09/2019 CLINICAL DATA:  Great toe wound for 4 weeks. Pain and infection. EXAM: LEFT FOOT - COMPLETE 3+ VIEW COMPARISON:  Great toe radiographs and forefoot MRI 12/27/2018 FINDINGS: No bony destructive change. Equivocal decreased density involving the lateral aspect of the great toe distal tuft at site of osteomyelitis on prior MRI. No periosteal reaction or frank osseous erosions. No soft tissue air or radiopaque foreign body. Soft tissue thickening of the digit. Hammertoe deformity  of the second through fifth digits. No findings elsewhere to suggest osteomyelitis. Well corticated density about the dorsal talus may represent sequela of remote prior injury or accessory ossicle. IMPRESSION: Equivocal decreased density involving the lateral aspect of the great toe distal tuft at site of osteomyelitis on prior MRI. No frank bony  destructive change. No soft tissue air or radiopaque foreign body. Electronically Signed   By: Keith Rake M.D.   On: 01/09/2019 19:07   Dg Toe Great Left  Result Date: 12/27/2018 CLINICAL DATA:  necrotic great toe, please eval for possible osteomyelitis/gasPatient reports pain in left great toe for 3 weeks. Patient states it started as blister along dorsal surface of great toe 3 weeks ago, he placed a band-aid on it and 2 weeks ago his distal great toe started to turn black. Pain is localized to great toe and does not radiate into rest of foot. No prior surgery or injury to great toe. EXAM: LEFT GREAT TOE COMPARISON:  12/07/2018 FINDINGS: There is no evidence of fracture or dislocation. No focal cortical erosion to suggest osteomyelitis. There is no evidence of arthropathy or other focal bone abnormality. Soft tissue swelling about the distal phalanx. No subcutaneous gas or radiodense foreign body. IMPRESSION: 1. Soft tissue swelling without bony abnormality. 2. No radiographic evidence of osteomyelitis. The Electronically Signed   By: Lucrezia Europe M.D.   On: 12/27/2018 14:55     Antimicrobials:   Completed a course of vancomycin and cefepime   Subjective: No acute events overnight, patient reports pain is well controlled, expressed hesitancy about being discharged home.  Explained to him the current need for additional heparin bridging but anticipate discharge home tomorrow if INR increases  Objective: Vitals:   01/15/19 2052 01/16/19 0421 01/16/19 0900 01/16/19 1208  BP: (!) 137/94 (!) 126/97 (!) 139/103 (!) 125/101  Pulse: 86 60 74 69  Resp: 18 18 18 16   Temp: 99.5 F (37.5 C) 99.3 F (37.4 C) 98.9 F (37.2 C) 98.8 F (37.1 C)  TempSrc: Oral Oral Oral Oral  SpO2: 100% 100% 98% 98%  Weight:      Height:        Intake/Output Summary (Last 24 hours) at 01/16/2019 1423 Last data filed at 01/16/2019 1100 Gross per 24 hour  Intake 960 ml  Output 700 ml  Net 260 ml   Filed  Weights   01/11/19 1637  Weight: 84.8 kg    Examination:  General exam: Appears calm and comfortable  Respiratory system: Clear to auscultation. Respiratory effort normal. Cardiovascular system: S1 & S2 heard, RRR. No JVD, murmurs, rubs, gallops or clicks. No pedal edema. Gastrointestinal system: Abdomen is nondistended, soft and nontender. No organomegaly or masses felt. Normal bowel sounds heard. Central nervous system: Alert and oriented. No focal neurological deficits. Extremities: Symmetric 5 x 5 power.  Left big toe and wrapping, no bleedthrough, neurovascularly intact on exposed digits, Skin: No rashes, lesions or ulcers, skin findings postsurgically did not take down wrapping Psychiatry: Judgement and insight appear normal. Mood & affect appropriate.     Data Reviewed: I have personally reviewed following labs and imaging studies  CBC: Recent Labs  Lab 01/09/19 1807  01/12/19 0156 01/13/19 1850 01/14/19 0526 01/15/19 0333 01/16/19 0306  WBC 3.9*   < > 3.6* 4.7 4.2 3.9* 3.9*  NEUTROABS 2.5  --   --   --   --   --   --   HGB 11.3*   < > 9.6* 9.3* 9.1* 9.2*  9.4*  HCT 34.7*   < > 30.0* 30.0* 28.0* 28.9* 29.9*  MCV 81.3   < > 82.0 82.0 82.1 81.9 81.5  PLT 251   < > 174 175 172 165 190   < > = values in this interval not displayed.   Basic Metabolic Panel: Recent Labs  Lab 01/09/19 1807 01/10/19 0123  NA 138 136  K 4.3 4.2  CL 103 104  CO2 26 25  GLUCOSE 98 103*  BUN 14 12  CREATININE 0.85 0.96  CALCIUM 9.3 8.8*   GFR: Estimated Creatinine Clearance: 82.9 mL/min (by C-G formula based on SCr of 0.96 mg/dL). Liver Function Tests: Recent Labs  Lab 01/10/19 0123  AST 38  ALT 25  ALKPHOS 41  BILITOT 1.1  PROT 5.9*  ALBUMIN 3.2*   No results for input(s): LIPASE, AMYLASE in the last 168 hours. No results for input(s): AMMONIA in the last 168 hours. Coagulation Profile: Recent Labs  Lab 01/13/19 0345 01/14/19 0526 01/15/19 0333 01/15/19 1342  01/16/19 0306  INR 1.1 1.3* 1.3* 1.3* 1.4*   Cardiac Enzymes: No results for input(s): CKTOTAL, CKMB, CKMBINDEX, TROPONINI in the last 168 hours. BNP (last 3 results) No results for input(s): PROBNP in the last 8760 hours. HbA1C: No results for input(s): HGBA1C in the last 72 hours. CBG: Recent Labs  Lab 01/12/19 1014  GLUCAP 87   Lipid Profile: No results for input(s): CHOL, HDL, LDLCALC, TRIG, CHOLHDL, LDLDIRECT in the last 72 hours. Thyroid Function Tests: No results for input(s): TSH, T4TOTAL, FREET4, T3FREE, THYROIDAB in the last 72 hours. Anemia Panel: No results for input(s): VITAMINB12, FOLATE, FERRITIN, TIBC, IRON, RETICCTPCT in the last 72 hours. Sepsis Labs: Recent Labs  Lab 01/09/19 1827  LATICACIDVEN 1.4    Recent Results (from the past 240 hour(s))  Culture, blood (routine x 2)     Status: None   Collection Time: 01/09/19  6:27 PM  Result Value Ref Range Status   Specimen Description   Final    BLOOD LEFT ANTECUBITAL Performed at [facility] [address], [place], [place] [address]    Special Requests   Final    BOTTLES DRAWN AEROBIC AND ANAEROBIC Blood Culture adequate volume Performed at [facility] [address] [place], [place] [address]    Culture   Final    NO GROWTH 5 DAYS Performed at [facility], [place] [address] [place], [place] [address]    Report Status 01/14/2019 FINAL  Final  Culture, blood (routine x 2)     Status: None   Collection Time: 01/09/19  6:50 PM  Result Value Ref Range Status   Specimen Description   Final    BLOOD LEFT ANTECUBITAL Performed at [facility] [address], [place], [place] [address]    Special Requests   Final    BOTTLES DRAWN AEROBIC AND ANAEROBIC Blood Culture adequate volume Performed at [facility] [address] [place], [place] [address]    Culture   Final    NO GROWTH 5 DAYS Performed at [facility], [place] [address] [place], [place] [address]    Report Status 01/14/2019 FINAL  Final  Surgical PCR screen     Status: None   Collection Time: 01/12/19  7:25 AM  Result Value Ref Range Status   MRSA, PCR NEGATIVE NEGATIVE Final   Staphylococcus aureus NEGATIVE NEGATIVE Final    Comment: (NOTE) The Xpert SA Assay (  FDA approved for NASAL specimens in patients 22 years of age and older), is one component of a comprehensive surveillance program. It is not intended to diagnose infection nor to guide or monitor treatment. Performed at [facility], [place] [address] [place], [place] [address]          Radiology Studies: No results found.      Scheduled Meds: . calcium carbonate  1 tablet Oral TID  . docusate sodium  100 mg Oral BID  . feeding supplement (ENSURE ENLIVE)  237 mL Oral BID BM  . feeding supplement (PRO-STAT SUGAR FREE 64)  30 mL Oral BID  . mupirocin ointment  1 application Nasal BID  . nicotine  21 mg Transdermal Daily  . thiamine  100 mg Oral Daily   Or  . thiamine  100 mg Intravenous Daily  . warfarin  10 mg Oral ONCE-1800  . Warfarin - Pharmacist Dosing Inpatient   Does not apply q1800   Continuous Infusions: . sodium chloride 75 mL/hr at 01/11/19 1718  . sodium chloride 10 mL/hr at 01/12/19 1125  . sodium chloride 10 mL/hr at 01/12/19 1545  . heparin 1,600 Units/hr (01/16/19 0838)  . lactated ringers 10 mL/hr at 01/12/19 0838  . methocarbamol (ROBAXIN) IV       LOS: 7 days    Time spent: 35 min    Nicolette Bang, MD [facility]  If 7PM-7AM, please contact night-coverage  01/16/2019, 2:23 PM

## 2019-01-16 NOTE — Plan of Care (Signed)
  Problem: Clinical Measurements: Goal: Ability to maintain clinical measurements within normal limits will improve Outcome: Progressing   Problem: Activity: Goal: Risk for activity intolerance will decrease Outcome: Progressing   Problem: Coping: Goal: Level of anxiety will decrease Outcome: Progressing   Problem: Elimination: Goal: Will not experience complications related to bowel motility Outcome: Progressing   Problem: Pain Managment: Goal: General experience of comfort will improve Outcome: Progressing   Problem: Skin Integrity: Goal: Risk for impaired skin integrity will decrease Description Risk for impaired skin integrity will decrease, skin will remain C/D I  Outcome: Progressing

## 2019-01-16 NOTE — Progress Notes (Signed)
Physical Therapy Treatment Patient Details Name: Leonard Bentley MRN: [id number] DOB: [birth year] Today's Date: 01/16/2019    History of Present Illness Pt is 58 yo male who presents for L great toe amp due to osteomyelitis. PMH: EtOH abuse, HTN, Non Hodgkins, PE, Tachycardia, MVR.     PT Comments    Continuing work on functional mobility and activity tolerance;  Noting very nice improvement in keeping the weight off his L foot today; We discussed the need to maintain TDWB LLE for the best possible opprotunity for healing -- he seems to understand better, and this session that did translate into much better adherence to WB precautions; plan to revisit stairs tomorrow   Follow Up Recommendations  Home health PT;Other (comment)(if able to get and pt agrees)     Equipment Recommendations  Rolling walker with 5" wheels;Other (comment)(tub transfer bench per OT)    Recommendations for Other Services       Precautions / Restrictions Precautions Precautions: Fall Precaution Comments: Fall risk reduced with use of RW Required Braces or Orthoses: Other Brace Other Brace: has post op shoe for both feet Restrictions LLE Weight Bearing: Touchdown weight bearing Other Position/Activity Restrictions: Better understanding of keeping weight off of L LE    Mobility  Bed Mobility Overal bed mobility: Independent                Transfers Overall transfer level: Needs assistance Equipment used: Rolling walker (2 wheeled) Transfers: Sit to/from [facility] Sit to Stand: Supervision Stand pivot transfers: Min guard       General transfer comment: cues for safe hand placement; supervision for safety and to maintain weight bearing status; stood to RW keeping NWB LLE; performe basice stand pivot transer chair back to bed at end of session  Ambulation/Gait Ambulation/Gait assistance: Min guard Gait Distance (Feet): 100 Feet(with one seated rest break) Assistive device:  Rolling walker (2 wheeled) Gait Pattern/deviations: (Hop-to pattern)     General Gait Details: We briefly talked about cane use, and ultimately it is nearly impossible to keep NWB with a unilateral device, so at this point, Im recommending RW full time; Maintained NWB consistently with today's walk; shaky and a bit more unstable with fatigue   Stairs         General stair comments: Plan to revisit stairs next session   Wheelchair Mobility    Modified Rankin (Stroke Patients Only)       Balance     Sitting balance-Leahy Scale: Good       Standing balance-Leahy Scale: Poor(but improving)                              Cognition Arousal/Alertness: Awake/alert Behavior During Therapy: WFL for tasks assessed/performed Overall Cognitive Status: History of cognitive impairments - at baseline                                 General Comments: Noting improvements in awareness of weight bearign precautions      Exercises      General Comments General comments (skin integrity, edema, etc.): Mr. Messimer asked me if there are times when he could put weight on his L foot ("just a few steps"); I took the opportunity to reinforce that if he puts more than wouchdown weight bearing on his L foot he risks not healing well  Pertinent Vitals/Pain Pain Assessment: Faces Faces Pain Scale: Hurts a little bit Pain Location: both feet Pain Descriptors / Indicators: Aching Pain Intervention(s): Limited activity within patient's tolerance    Home Living                      Prior Function            PT Goals (current goals can now be found in the care plan section) Acute Rehab PT Goals Patient Stated Goal: Hopes to be able to manage at home PT Goal Formulation: With patient Time For Goal Achievement: 01/20/19 Potential to Achieve Goals: Good Progress towards PT goals: Progressing toward goals    Frequency    Min 3X/week      PT Plan  Current plan remains appropriate    Co-evaluation              AM-PAC PT "6 Clicks" Mobility   Outcome Measure  Help needed turning from your back to your side while in a flat bed without using bedrails?: None Help needed moving from lying on your back to sitting on the side of a flat bed without using bedrails?: None Help needed moving to and from a bed to a chair (including a wheelchair)?: None Help needed standing up from a chair using your arms (e.g., wheelchair or bedside chair)?: None Help needed to walk in hospital room?: A Little Help needed climbing 3-5 steps with a railing? : A Little 6 Click Score: 22    End of Session Equipment Utilized During Treatment: Gait belt Activity Tolerance: Patient tolerated treatment well Patient left: in bed;with call bell/phone within reach Nurse Communication: Mobility status PT Visit Diagnosis: Pain;Difficulty in walking, not elsewhere classified (R26.2) Pain - Right/Left: Left Pain - part of body: Ankle and joints of foot     Time: 1452-1510 PT Time Calculation (min) (ACUTE ONLY): 18 min  Charges:  $Gait Training: 8-22 mins                     Roney Marion, [place]  Acute Rehabilitation Services Pager [phone] Office [place] 01/16/2019, 4:35 PM

## 2019-01-16 NOTE — Evaluation (Signed)
Speech Language Pathology Evaluation Patient Details Name: Leonard Bentley MRN: [id number] DOB: 1961-07-14 Today's Date: 01/16/2019 Time: 0825-0850 SLP Time Calculation (min) (ACUTE ONLY): 25 min  Problem List:  Patient Active Problem List   Diagnosis Date Noted  . Osteomyelitis of great toe of left foot ([place]) 01/09/2019  . Foot ulcer, left ([place]) 12/27/2018  . Cellulitis 12/07/2018  . Bleeding from left ear 11/28/2018  . HTN (hypertension) 11/02/2018  . UTI (urinary tract infection) 11/02/2018  . Ecchymosis   . Tachyarrhythmia 08/30/2018  . Hypomagnesemia 08/30/2018  . Recurrent pulmonary emboli ([place])   . Hematuria   . Delirium tremens ([place])   . LFTs abnormal   . Alcoholic hepatitis without ascites   . Alcohol withdrawal ([place]) 06/04/2018  . Dizziness   . Posterior tibial tendon dysfunction 02/21/2018  . Supratherapeutic INR 11/27/2016  . Dehydration 11/27/2016  . SVT (supraventricular tachycardia) ([place])   . S/P minimally-invasive mitral valve repair 10/27/2016  . History of pulmonary embolus (PE) 04/11/2016  . Left sided numbness   . Anxiety 04/02/2016  . Panic attacks 04/02/2016  . Mitral valve prolapse   . Mitral regurgitation 01/20/2016  . Atrial tachycardia ([place]) 01/06/2016  . Murmur 01/06/2016  . Mobitz I 10/09/2015  . PAC (premature atrial contraction) 10/09/2015  . Arthritis 08/14/2015  . Neuropathy due to chemotherapeutic drug ([place]) 03/09/2013  . Leucopenia 01/31/2013  . Personal history of pulmonary embolism 12/28/2012  . Alcohol abuse 08/24/2012  . Cigarette smoker 08/24/2012  . Hemorrhoid 03/08/2012  . Anemia 12/10/2011  . History of non-Hodgkin's lymphoma 09/25/2011   Past Medical History:  Past Medical History:  Diagnosis Date  . Acute pulmonary embolism ([place]) 04/11/2016  . Anxiety   . Arthritis   . Atrial tachycardia ([place]) 01/06/2016  . Depression   . DTs (delirium tremens) ([place])   . Dyspnea   . ED (erectile dysfunction)   . ETOH abuse   .  Hypertension   . Lymphoma, non Hodgkin's 09/25/2011   Stage 2  . Mitral regurgitation 01/20/2016  . Mobitz I 10/09/2015  . Murmur 01/06/2016  . Occasional tremors   . PAC (premature atrial contraction) 10/09/2015  . S/P minimally-invasive mitral valve repair 10/27/2016   Complex valvuloplasty including artificial Gore-tex neochord placement x6 and 34 mm Edwards Physio II ring annuloplasty via right mini thoracotomy approach   Past Surgical History:  Past Surgical History:  Procedure Laterality Date  . AMPUTATION Left 01/12/2019   Procedure: LEFT GREAT TOE AMPUTATION;  Surgeon: [name], MD;  Location: [facility];  Service: Orthopedics;  Laterality: Left;  . CARDIAC CATHETERIZATION N/A 08/13/2016   Procedure: Right/Left Heart Cath and Coronary Angiography;  Surgeon: [name], MD;  Location: [facility];  Service: Cardiovascular;  Laterality: N/A;  . MITRAL VALVE REPAIR Right 10/27/2016   Procedure: MINIMALLY INVASIVE MITRAL VALVE REPAIR (MVR) USING 34 PHYSIO II ANNULOPLASTY RING;  Surgeon: [name], MD;  Location: [facility];  Service: Open Heart Surgery;  Laterality: Right;  . SKIN BIOPSY Right 04/04/2018   right mid medial anterior tibial shave  see report in chart  . TEE WITHOUT CARDIOVERSION N/A 02/11/2016   Procedure: TRANSESOPHAGEAL ECHOCARDIOGRAM (TEE);  Surgeon: [name], MD;  Location: [facility];  Service: Cardiovascular;  Laterality: N/A;  . TEE WITHOUT CARDIOVERSION N/A 10/27/2016   Procedure: TRANSESOPHAGEAL ECHOCARDIOGRAM (TEE);  Surgeon: [name], MD;  Location: [facility];  Service: Open Heart Surgery;  Laterality: N/A;  . TRIGGER FINGER RELEASE Bilateral  HPI:  Pt is 58 yo male who presents for L great toe amp due to osteomyelitis. PMH: EtOH abuse, HTN, Non Hodgkins, PE, Tachycardia, MVR. Pt has complex medical history which he has not be able to effectively manage d/t costs and ETOH abuse. Pt with multiple admissions for ETOH withdrawal with  reports of seizures during withdrawal. Per chart review, long standing recommendation for placement at SNF or for pt to have 24 hour supervision  within home d/t decreased safety awareness, history of short term memory deficits (d/t ETOH use) and inability to provide effective care for himself. No acute neurological deficits identified.    Assessment / Plan / Recommendation Clinical Impression  Pt with cognitive deficits that appear baseline per chart review. Pt continues to exhibit severe anxious that exhibits itself with mild dysfluencies and overall visible discomfort with evaluation. Pt denies memory deficits and demonstrates poor overall awareness of ETOH use and financial deficits. SLP provided list of compensatory memory strategies, reiterated use of pill box and left pt with notepad to use to aid with memory. Pt politely declines further services. ST to sign off at this time.     SLP Assessment  SLP Recommendation/Assessment: Patient does not need any further Speech Lanaguage Pathology Services SLP Visit Diagnosis: Cognitive communication deficit (R41.841)    Follow Up Recommendations  None          SLP Evaluation Cognition  Overall Cognitive Status: History of cognitive impairments - at baseline Arousal/Alertness: Awake/alert Orientation Level: Oriented X4 Attention: Selective       Comprehension  Auditory Comprehension Overall Auditory Comprehension: Appears within functional limits for tasks assessed Reading Comprehension Reading Status: Within funtional limits    Expression Expression Primary Mode of Expression: Verbal Verbal Expression Overall Verbal Expression: Appears within functional limits for tasks assessed Written Expression Dominant Hand: Right Written Expression: Not tested   Oral / Motor  Oral Motor/Sensory Function Overall Oral Motor/Sensory Function: Within functional limits Motor Speech Overall Motor Speech: Appears within functional limits for tasks  assessed Respiration: Within functional limits Phonation: Normal Resonance: Within functional limits Articulation: Within functional limitis Intelligibility: Intelligible Motor Planning: Witnin functional limits Motor Speech Errors: Not applicable   GO                    Josmar Messimer 01/16/2019, 10:13 AM

## 2019-01-17 LAB — CBC
HCT: 28.2 % — ABNORMAL LOW (ref 39.0–52.0)
Hemoglobin: 9.2 g/dL — ABNORMAL LOW (ref 13.0–17.0)
MCH: 26.6 pg (ref 26.0–34.0)
MCHC: 32.6 g/dL (ref 30.0–36.0)
MCV: 81.5 fL (ref 80.0–100.0)
Platelets: 190 10*3/uL (ref 150–400)
RBC: 3.46 MIL/uL — ABNORMAL LOW (ref 4.22–5.81)
RDW: 15.5 % (ref 11.5–15.5)
WBC: 3.6 10*3/uL — ABNORMAL LOW (ref 4.0–10.5)
nRBC: 0.6 % — ABNORMAL HIGH (ref 0.0–0.2)

## 2019-01-17 LAB — PROTIME-INR
INR: 1.6 — ABNORMAL HIGH (ref 0.8–1.2)
Prothrombin Time: 19.1 seconds — ABNORMAL HIGH (ref 11.4–15.2)

## 2019-01-17 LAB — HEPARIN LEVEL (UNFRACTIONATED): Heparin Unfractionated: 0.47 IU/mL (ref 0.30–0.70)

## 2019-01-17 MED ORDER — WARFARIN SODIUM 7.5 MG PO TABS
7.5000 mg | ORAL_TABLET | Freq: Once | ORAL | Status: AC
  Administered 2019-01-17: 7.5 mg via ORAL
  Filled 2019-01-17: qty 1

## 2019-01-17 NOTE — Progress Notes (Signed)
Leonard Bentley for warfarin with IV Heparin bridge Indication: hx recurrent PE  Allergies  Allergen Reactions  . Other Other (See Comments)    "Cactus" blisters on tongue    Patient Measurements: Height: 5\' 9"  (175.3 cm) Weight: 187 lb (84.8 kg) IBW/kg (Calculated) : 70.7  Vital Signs: Temp: 98.5 F (36.9 C) (03/11 0418) Temp Source: Oral (03/11 0418) BP: 132/93 (03/11 0418) Pulse Rate: 70 (03/11 0418)  Labs: Recent Labs    01/15/19 0333 01/15/19 1342 01/16/19 0306 01/17/19 0430  HGB 9.2*  --  9.4* 9.2*  HCT 28.9*  --  29.9* 28.2*  PLT 165  --  190 190  LABPROT 16.1* 16.4* 17.3* 19.1*  INR 1.3* 1.3* 1.4* 1.6*  HEPARINUNFRC 0.45  --  0.39 0.47    Estimated Creatinine Clearance: 82.9 mL/min (by C-G formula based on SCr of 0.96 mg/dL).   Assessment: 66 YOM with h/o recurrent PE who presented to [facility] with osteomyelitis of left big toe. Patient was on warfarin at home which was discontinued for surgery.  s/p left great toe amputation on 01/12/19. Pharmacy consulted to resume warfarin 3/6.  Restarted IV heparin bridge 01/13/19, no bolus.   Heparin level = 0.47, therapeutic this AM on heparin drip 1600 units/hr.  INR = 1.6 (slow to increase, higher dose of 10 mg given 3/10) Hgb low/stable at 9.2 and pltc wnl. No bleeding noted.  PTA: 5 mg daily (however, did not appear to be therapeutic on this dose)  Goal of Therapy:  Heparin level = 0.3-0.7 units/ml INR 2-3 Monitor platelets by anticoagulation protocol: Yes   Plan:  Continue IV heparin drip 1600 units/hr, no bolus. Daily hep lvl, cbc, INR Warfarin 7.5mg  po x1  Sloan Leiter, PharmD, BCPS, BCCCP Clinical Pharmacist Please refer to [facility] for Leonard Bentley numbers 01/17/2019, 7:32 AM

## 2019-01-17 NOTE — Progress Notes (Signed)
[facility] PROGRESS NOTE    Progress Note  Leonard Bentley Signer  UXL:[id number] DOB: 03-Dec-1960 DOA: 01/09/2019 PCP: [name], MD     Brief Narrative:   Leonard Bentley is an 58 y.o. male has medical history of recent recurrent PEs, alcohol abuse tobacco peripheral vascular disease as well as non-Hodgkin's lymphoma who was sent to the ER for acute osteomyelitis by his orthopedic physician.  Orthopedic surgery evaluated the patient and performed a left big toe amputation.  Remains in the hospital as he is being bridge with IV heparin and Coumadin until INR becomes therapeutic for at least 24 hours.  Assessment/Plan:   Recurrent Osteomyelitis of great toe of left foot [facility]): Failed outpatient antibiotic treatments. Blood cultures have remained negative till date. Orthopedic surgery was consulted on 01/12/2019 subsequently underwent left big toe amputation no apparent intraoperative complications. AntiBiotics stopped on 01/14/2019 Orthopedic surgery deemed him safe to restart anticoagulation.  History of recurrent pulmonary emboli: Coumadin was discontinued and started on IV heparin which was held before surgical procedure and restarted. Currently on Coumadin and heparin until INR is therapeutic for at least 24 hours. Is 1.6.  History of alcohol abuse: He has been stable no signs of withdrawal continue to monitor with the CIWA protocol  Tobacco abuse: Nicotine patch placed.  Essential hypertension: Continue current regimen.  SVT: Most likely due to alcohol withdrawal patient is currently stable. Currently on no rate controlling medication. It is no rate controlling medication at home.  Disposition: To his recurrent PEs patient requires heparin and Coumadin until INR becomes therapeutic. Patient has no payer source.   DVT prophylaxis: IV heparin and Coumadin Family Communication:none Disposition Plan/Barrier to D/C: once INR Therapeutic Code Status:     Code Status Orders    (From admission, onward)         Start     Ordered   01/09/19 2321  Full code  Continuous     01/09/19 2320        Code Status History    Date Active Date Inactive Code Status Order ID Comments User Context   12/27/2018 1619 12/31/2018 1428 Full Code [id number]  Oswald Hillock, MD ED   12/07/2018 2253 12/10/2018 1723 Full Code [id number]  [name], MD ED   11/27/2018 0528 11/28/2018 1548 Full Code [id number]  [name], MD ED   11/02/2018 0135 11/08/2018 1511 Full Code [id number]  [name], MD ED   11/02/2018 0053 11/02/2018 0135 Full Code [id number]  [name], MD ED   08/30/2018 2305 09/01/2018 0042 Full Code [id number]  [name], MD Inpatient   08/30/2018 1620 08/30/2018 2305 Full Code [id number]  [name], PA-C ED   07/25/2018 1557 07/30/2018 1714 Full Code [id number]  [name], MD ED   06/04/2018 2302 06/13/2018 0204 Full Code [id number]  [name], MD Inpatient   11/27/2016 2247 11/28/2016 1715 Full Code [id number]  [name], MD Inpatient   10/27/2016 1420 10/31/2016 1519 Full Code [id number]  [name], MD Inpatient   08/13/2016 0855 08/13/2016 1559 Full Code [id number]  [name], MD Inpatient   04/02/2016 1736 04/03/2016 1820 Full Code [id number]  [name], NP Inpatient   01/31/2013 0615 02/02/2013 1800 Full Code [id number]  [name], MD Inpatient   01/30/2013 2133 01/31/2013 0615 Full Code [id number]  [name], MD ED   12/28/2012 1235 01/01/2013 1336 Full Code [id number]  [name], MD ED   08/24/2012  0423 08/25/2012 1426 Full Code [id number]  [name]., DO ED        IV Access:    Peripheral IV   Procedures and diagnostic studies:   No results found.   Medical Consultants:    None.  Anti-Infectives:   None  Subjective:    Leonard Bentley he relates he feels great no new complaints pain is controlled  Objective:    Vitals:   01/16/19 1208 01/16/19 1834  01/16/19 2021 01/17/19 0418  BP: (!) 125/101 (!) 134/92 (!) 129/101 (!) 132/93  Pulse: 69 77 90 70  Resp: 16 18 20 18   Temp: 98.8 F (37.1 C) 98.1 F (36.7 C) 98.6 F (37 C) 98.5 F (36.9 C)  TempSrc: Oral Oral Oral Oral  SpO2: 98% 100% 99% 100%  Weight:      Height:        Intake/Output Summary (Last 24 hours) at 01/17/2019 0748 Last data filed at 01/16/2019 2130 Gross per 24 hour  Intake 1484.78 ml  Output 900 ml  Net 584.78 ml   Filed Weights   01/11/19 1637  Weight: 84.8 kg    Exam: General exam: In no acute distress. Respiratory system: Good air movement and clear to auscultation. Cardiovascular system: S1 & S2 heard, RRR.  Gastrointestinal system: Abdomen is nondistended, soft and nontender.  Central nervous system: Alert and oriented. No focal neurological deficits. Extremities:  left foot is wrapped Skin: No rashes, lesions or ulcers Psychiatry: Judgement and insight appear normal. Mood & affect appropriate.    Data Reviewed:    Labs: Basic Metabolic Panel: No results for input(s): NA, K, CL, CO2, GLUCOSE, BUN, CREATININE, CALCIUM, MG, PHOS in the last 168 hours. GFR Estimated Creatinine Clearance: 82.9 mL/min (by C-G formula based on SCr of 0.96 mg/dL). Liver Function Tests: No results for input(s): AST, ALT, ALKPHOS, BILITOT, PROT, ALBUMIN in the last 168 hours. No results for input(s): LIPASE, AMYLASE in the last 168 hours. No results for input(s): AMMONIA in the last 168 hours. Coagulation profile Recent Labs  Lab 01/14/19 0526 01/15/19 0333 01/15/19 1342 01/16/19 0306 01/17/19 0430  INR 1.3* 1.3* 1.3* 1.4* 1.6*    CBC: Recent Labs  Lab 01/13/19 1850 01/14/19 0526 01/15/19 0333 01/16/19 0306 01/17/19 0430  WBC 4.7 4.2 3.9* 3.9* 3.6*  HGB 9.3* 9.1* 9.2* 9.4* 9.2*  HCT 30.0* 28.0* 28.9* 29.9* 28.2*  MCV 82.0 82.1 81.9 81.5 81.5  PLT 175 172 165 190 190   Cardiac Enzymes: No results for input(s): CKTOTAL, CKMB, CKMBINDEX, TROPONINI in  the last 168 hours. BNP (last 3 results) No results for input(s): PROBNP in the last 8760 hours. CBG: Recent Labs  Lab 01/12/19 1014  GLUCAP 87   D-Dimer: No results for input(s): DDIMER in the last 72 hours. Hgb A1c: No results for input(s): HGBA1C in the last 72 hours. Lipid Profile: No results for input(s): CHOL, HDL, LDLCALC, TRIG, CHOLHDL, LDLDIRECT in the last 72 hours. Thyroid function studies: No results for input(s): TSH, T4TOTAL, T3FREE, THYROIDAB in the last 72 hours.  Invalid input(s): FREET3 Anemia work up: No results for input(s): VITAMINB12, FOLATE, FERRITIN, TIBC, IRON, RETICCTPCT in the last 72 hours. Sepsis Labs: Recent Labs  Lab 01/14/19 0526 01/15/19 0333 01/16/19 0306 01/17/19 0430  WBC 4.2 3.9* 3.9* 3.6*   Microbiology Recent Results (from the past 240 hour(s))  Culture, blood (routine x 2)     Status: None   Collection Time: 01/09/19  6:27 PM  Result Value Ref  Range Status   Specimen Description   Final    BLOOD LEFT ANTECUBITAL Performed at [facility] [address] [place], [place] [address]    Special Requests   Final    BOTTLES DRAWN AEROBIC AND ANAEROBIC Blood Culture adequate volume Performed at [facility] [address], [place], [place] [address]    Culture   Final    NO GROWTH 5 DAYS Performed at [facility], [place] [address] [place], [place] [address]    Report Status 01/14/2019 FINAL  Final  Culture, blood (routine x 2)     Status: None   Collection Time: 01/09/19  6:50 PM  Result Value Ref Range Status   Specimen Description   Final    BLOOD LEFT ANTECUBITAL Performed at [facility] [address] [place], [place] [address]    Special Requests   Final    BOTTLES DRAWN AEROBIC AND ANAEROBIC Blood Culture adequate volume Performed at [facility] [address], [place], [place] [address]    Culture   Final    NO GROWTH 5  DAYS Performed at [facility], [place] [address], [place], [place] [address]    Report Status 01/14/2019 FINAL  Final  Surgical PCR screen     Status: None   Collection Time: 01/12/19  7:25 AM  Result Value Ref Range Status   MRSA, PCR NEGATIVE NEGATIVE Final   Staphylococcus aureus NEGATIVE NEGATIVE Final    Comment: (NOTE) The Xpert SA Assay (FDA approved for NASAL specimens in patients 38 years of age and older), is one component of a comprehensive surveillance program. It is not intended to diagnose infection nor to guide or monitor treatment. Performed at [facility], [place] [address] [place],  [address]      Medications:   . calcium carbonate  1 tablet Oral TID  . docusate sodium  100 mg Oral BID  . feeding supplement (ENSURE ENLIVE)  237 mL Oral BID BM  . feeding supplement (PRO-STAT SUGAR FREE 64)  30 mL Oral BID  . mupirocin ointment  1 application Nasal BID  . nicotine  21 mg Transdermal Daily  . thiamine  100 mg Oral Daily   Or  . thiamine  100 mg Intravenous Daily  . warfarin  7.5 mg Oral ONCE-1800  . Warfarin - Pharmacist Dosing Inpatient   Does not apply q1800   Continuous Infusions: . sodium chloride 75 mL/hr at 01/11/19 1718  . sodium chloride 10 mL/hr at 01/12/19 1125  . sodium chloride 10 mL/hr at 01/12/19 1545  . heparin 1,600 Units/hr (01/17/19 0205)  . lactated ringers 10 mL/hr at 01/12/19 0838  . methocarbamol (ROBAXIN) IV       LOS: 8 days   Charlynne Cousins  [facility]  01/17/2019, 7:48 AM

## 2019-01-17 NOTE — Progress Notes (Signed)
1350 BP 141/117 pt verbalized of feeling anxious. Message sent to Dr Aileen Fass.

## 2019-01-17 NOTE — Progress Notes (Signed)
Nutrition Follow-up  DOCUMENTATION CODES:   Not applicable  INTERVENTION:    Continue Ensure Enlive po BID, each supplement provides 350 kcal and 20 grams of protein  Continue Pro-stat 30 ml PO BID, each supplement provides 100 kcal and 15 gm protein  NUTRITION DIAGNOSIS:   Increased nutrient needs related to wound healing as evidenced by estimated needs.  Ongoing   GOAL:   Patient will meet greater than or equal to 90% of their needs  Met   MONITOR:   PO intake, Supplement acceptance, Labs, Skin  REASON FOR ASSESSMENT:   Malnutrition Screening Tool    ASSESSMENT:   58 year old male with medical history significant of recurrent pulmonary embolism, HTN, EtOH, non-Hodgkin's lymphoma, recent [place] d/c 2/19 for non healing wound of left big toe admitted for osteomyelitis with open wound; scheduled for amputation on Friday  S/P left great toe amputation 3/6.  Remains on coumadin and heparin. Awaiting therapeutic INR level.   Eating very well, good appetite, consuming 100% of all meals. He is being offered Ensure Enlive BID, drinking 1-2 per [place]. Also receiving Pro-stat 30 ml PO BID.   Labs and medications reviewed.  Diet Order:   Diet Order            Diet Carb Modified Fluid consistency: Thin; Room service appropriate? Yes  Diet effective now              EDUCATION NEEDS:   Education needs have been addressed  Skin:  Skin Assessment: Skin Integrity Issues: Skin Integrity Issues:: Incisions Incisions: L great toe wound, s/p amputation  Last BM:  3/10  Height:   Ht Readings from Last 1 Encounters:  01/11/19 _0  (1.753 m)    Weight:   Wt Readings from Last 1 Encounters:  01/11/19 84.8 kg    Ideal Body Weight:  72.7 kg  BMI:  Body mass index is 27.62 kg/m.  Estimated Nutritional Needs:   Kcal:  2150-2500  Protein:  115-127 gm  Fluid:  >2L    Molli Barrows, RD, LDN, [place] Pager [phone] After Hours Pager [phone]

## 2019-01-17 NOTE — Progress Notes (Signed)
Physical Therapy Treatment Patient Details Name: Leonard Bentley MRN: [id number] DOB: 07-30-1961 Today's Date: 01/17/2019    History of Present Illness Pt is 58 yo male who presents for L great toe amp due to osteomyelitis. PMH: EtOH abuse, HTN, Non Hodgkins, PE, Tachycardia, MVR.     PT Comments    Patient seen for mobility progression and stair training. Pt is making progress with maintaining TDWB status while ambulating with bilat UE support and min guard/min A. Pt with increased shakiness all over this session. Pt continues to have difficulty with stair training and is unable to truly maintain TDWB to ascend/descend steps simulating home entrance. Given pt's cognitive deficits, decreased safety awareness, difficulty maintaining TDWB status, reported inability to use RW in home, and decreased caregiver assistance recommending SNF for further skilled PT services to maximize independence and safety with mobility prior to d/c home alone.   Follow Up Recommendations  SNF     Equipment Recommendations  Rolling walker with 5" wheels;Other (comment)(tub transfer bench per OT)    Recommendations for Other Services       Precautions / Restrictions Precautions Precautions: Fall Precaution Comments: Fall risk reduced with use of RW Required Braces or Orthoses: Other Brace Other Brace: has post op shoe for both feet Restrictions Weight Bearing Restrictions: Yes LLE Weight Bearing: Touchdown weight bearing    Mobility  Bed Mobility Overal bed mobility: Independent                Transfers Overall transfer level: Needs assistance Equipment used: Rolling walker (2 wheeled) Transfers: Sit to/from [facility] Sit to Stand: Min guard;Supervision         General transfer comment: assist for safety  Ambulation/Gait Ambulation/Gait assistance: Min guard;Min assist Gait Distance (Feet): 75 Feet Assistive device: Rolling walker (2 wheeled) Gait Pattern/deviations:  Step-to pattern;Decreased stance time - left;Decreased step length - right;Decreased weight shift to left Gait velocity: decreased   General Gait Details: cues for maintaining TDWB status; assist at times for balance due to shakiness   Stairs Stairs: Yes Stairs assistance: Min assist;Mod assist Stair Management: One rail Left;Step to pattern;Forwards;With cane Number of Stairs: 2 General stair comments: cues for sequencing and technique; pt has difficulty remembering which foot to lead with and has difficulty maintaining TDWB status. the best way for pt to get up/down steps will be to use L hand rail and SPC however pt does not have SPC at home; discussed a friend/family member getting SPC before d/c home but pt is vague about if anyone will be able to do this   Wheelchair Mobility    Modified Rankin (Stroke Patients Only)       Balance     Sitting balance-Leahy Scale: Good       Standing balance-Leahy Scale: Poor                              Cognition Arousal/Alertness: Awake/alert Behavior During Therapy: WFL for tasks assessed/performed Overall Cognitive Status: History of cognitive impairments - at baseline                                        Exercises      General Comments        Pertinent Vitals/Pain Pain Assessment: Faces Faces Pain Scale: Hurts little more Pain Location: L foot Pain Descriptors /  Indicators: Aching Pain Intervention(s): Limited activity within patient's tolerance;Monitored during session;Repositioned;Premedicated before session    Home Living                      Prior Function            PT Goals (current goals can now be found in the care plan section) Acute Rehab PT Goals Patient Stated Goal: Hopes to be able to manage at home Progress towards PT goals: Progressing toward goals    Frequency    Min 3X/week      PT Plan Discharge plan needs to be updated    Co-evaluation               AM-PAC PT "6 Clicks" Mobility   Outcome Measure  Help needed turning from your back to your side while in a flat bed without using bedrails?: None Help needed moving from lying on your back to sitting on the side of a flat bed without using bedrails?: None Help needed moving to and from a bed to a chair (including a wheelchair)?: None Help needed standing up from a chair using your arms (e.g., wheelchair or bedside chair)?: None Help needed to walk in hospital room?: A Little Help needed climbing 3-5 steps with a railing? : A Lot 6 Click Score: 21    End of Session Equipment Utilized During Treatment: Gait belt Activity Tolerance: Patient tolerated treatment well Patient left: with call bell/phone within reach;in chair Nurse Communication: Mobility status PT Visit Diagnosis: Pain;Difficulty in walking, not elsewhere classified (R26.2) Pain - Right/Left: Left Pain - part of body: Ankle and joints of foot     Time: 0623-7628 PT Time Calculation (min) (ACUTE ONLY): 30 min  Charges:  $Gait Training: 23-37 mins                     Earney Navy, PTA Acute Rehabilitation Services Pager: [phone] Office: [phone]     Darliss Cheney 01/17/2019, 3:53 PM

## 2019-01-18 LAB — CBC
HCT: 30.6 % — ABNORMAL LOW (ref 39.0–52.0)
Hemoglobin: 9.6 g/dL — ABNORMAL LOW (ref 13.0–17.0)
MCH: 25.6 pg — ABNORMAL LOW (ref 26.0–34.0)
MCHC: 31.4 g/dL (ref 30.0–36.0)
MCV: 81.6 fL (ref 80.0–100.0)
Platelets: 188 10*3/uL (ref 150–400)
RBC: 3.75 MIL/uL — ABNORMAL LOW (ref 4.22–5.81)
RDW: 15.3 % (ref 11.5–15.5)
WBC: 3.6 10*3/uL — ABNORMAL LOW (ref 4.0–10.5)
nRBC: 0 % (ref 0.0–0.2)

## 2019-01-18 LAB — PROTIME-INR
INR: 1.6 — ABNORMAL HIGH (ref 0.8–1.2)
Prothrombin Time: 18.6 seconds — ABNORMAL HIGH (ref 11.4–15.2)

## 2019-01-18 LAB — HEPARIN LEVEL (UNFRACTIONATED): Heparin Unfractionated: 0.38 IU/mL (ref 0.30–0.70)

## 2019-01-18 MED ORDER — WARFARIN SODIUM 5 MG PO TABS
10.0000 mg | ORAL_TABLET | Freq: Once | ORAL | Status: AC
  Administered 2019-01-18: 10 mg via ORAL
  Filled 2019-01-18: qty 2

## 2019-01-18 NOTE — Progress Notes (Signed)
Occupational Therapy Treatment Patient Details Name: Leonard Bentley MRN: [id number] DOB: 1961/06/01 Today's Date: 01/18/2019    History of present illness Pt is 58 yo male who presents for L great toe amp due to osteomyelitis. PMH: EtOH abuse, HTN, Non Hodgkins, PE, Tachycardia, MVR.    OT comments  Pt appears to be making progress. Pt continues to have difficulty at times maintaining TWB status LLE. Concerns regarding pt's ability to complete IADL tasks after DC while maintaining TDWB status. Best DC plan would be for pt to DC home with family/freind who could assist with IADL tasks. Pt may need rehab at SNF to facilitate return to PLOF. Will continue to follow acutely.   Follow Up Recommendations  Supervision - Intermittent;SNF vs HHOT(pending progress)  If DC home, recommend [facility] for wound and medication management   Equipment Recommendations  Tub/shower bench    Recommendations for Other Services      Precautions / Restrictions Precautions Precautions: Fall Required Braces or Orthoses: Other Brace Other Brace: has post op shoe for both feet Restrictions Weight Bearing Restrictions: Yes LLE Weight Bearing: Touchdown weight bearing       Mobility Bed Mobility Overal bed mobility: Modified Independent                Transfers Overall transfer level: Needs assistance Equipment used: Rolling walker (2 wheeled) Transfers: Sit to/from Stand Sit to Stand: Supervision              Balance Overall balance assessment: Needs assistance   Sitting balance-Leahy Scale: Good       Standing balance-Leahy Scale: Fair                             ADL either performed or assessed with clinical judgement   ADL Overall ADL's : Needs assistance/impaired     Grooming: Supervision/safety;Standing   Upper Body Bathing: Set up;Sitting   Lower Body Bathing: Supervison/ safety;Set up;Sit to/from stand   Upper Body Dressing : Set up;Sitting   Lower Body  Dressing: Set up;Supervision/safety;Sit to/from stand   Toilet Transfer: Supervision/safety;RW;Ambulation   Toileting- Water quality scientist and Hygiene: Modified independent;Sit to/from stand       Functional mobility during ADLs: Rolling walker;Supervision/safety;Cueing for safety(stepping back wtih operated L LE) General ADL Comments: issued reacher; may need long handled sponge to help maintain WBS     Vision       Perception     Praxis      Cognition Arousal/Alertness: Awake/alert Behavior During Therapy: Anxious Overall Cognitive Status: No family/caregiver present to determine baseline cognitive functioning                                 General Comments: Noting improvements in awareness of weight bearign precautions        Exercises     Shoulder Instructions       General Comments      Pertinent Vitals/ Pain       Pain Assessment: 0-10 Pain Score: 7  Pain Location: L foot Pain Descriptors / Indicators: Aching Pain Intervention(s): Limited activity within patient's tolerance  Home Living                                          Prior Functioning/Environment  Frequency  Min 3X/week        Progress Toward Goals  OT Goals(current goals can now be found in the care plan section)  Progress towards OT goals: Progressing toward goals  Acute Rehab OT Goals Patient Stated Goal: Hopes to be able to manage at home OT Goal Formulation: With patient Time For Goal Achievement: 01/20/19 Potential to Achieve Goals: Good ADL Goals Pt Will Perform Tub/Shower Transfer: with modified independence;tub bench;rolling walker;ambulating Additional ADL Goal #1: Pt will adhere to TDWB on L foot as he gathers items necessary for ADL around his room with RW.  Plan Frequency needs to be updated;Discharge plan needs to be updated    Co-evaluation                 AM-PAC OT "6 Clicks" Daily Activity     Outcome  Measure   Help from another person eating meals?: None Help from another person taking care of personal grooming?: None Help from another person toileting, which includes using toliet, bedpan, or urinal?: A Little Help from another person bathing (including washing, rinsing, drying)?: A Little Help from another person to put on and taking off regular upper body clothing?: None Help from another person to put on and taking off regular lower body clothing?: A Little 6 Click Score: 21    End of Session Equipment Utilized During Treatment: Gait belt;Rolling walker  OT Visit Diagnosis: Pain;Other abnormalities of gait and mobility (R26.89) Pain - Right/Left: Left Pain - part of body: Ankle and joints of foot   Activity Tolerance Patient tolerated treatment well   Patient Left in bed;with call bell/phone within reach(pt refuused to have bed alarm on )   Nurse Communication Mobility status        Time: 1532-1600 OT Time Calculation (min): 28 min  Charges: OT General Charges $OT Visit: 1 Visit OT Treatments $Self Care/Home Management : 23-37 mins  Maurie Boettcher, OT/L   Acute OT Clinical Specialist [place] Pager [phone] Office [phone]    [facility] 01/18/2019, 4:16 PM

## 2019-01-18 NOTE — Progress Notes (Signed)
[place] for warfarin with IV Heparin bridge Indication: hx recurrent PE  Allergies  Allergen Reactions  . Other Other (See Comments)    "Cactus" blisters on tongue    Patient Measurements: Height: 5\' 9"  (175.3 cm) Weight: 187 lb (84.8 kg) IBW/kg (Calculated) : 70.7  Vital Signs: Temp: 98.6 F (37 C) (03/12 0357) Temp Source: Oral (03/12 0357) BP: 145/107 (03/12 0357) Pulse Rate: 80 (03/12 0357)  Labs: Recent Labs    01/16/19 0306 01/17/19 0430 01/18/19 0340  HGB 9.4* 9.2* 9.6*  HCT 29.9* 28.2* 30.6*  PLT 190 190 188  LABPROT 17.3* 19.1* 18.6*  INR 1.4* 1.6* 1.6*  HEPARINUNFRC 0.39 0.47 0.38    Estimated Creatinine Clearance: 82.9 mL/min (by C-G formula based on SCr of 0.96 mg/dL).   Assessment: 16 YOM with h/o recurrent PE who presented to [facility] with osteomyelitis of left big toe. Patient was on warfarin at home which was discontinued for surgery.  s/p left great toe amputation on 01/12/19. Pharmacy consulted to resume warfarin 3/6.  Restarted IV heparin bridge 01/13/19, no bolus.   Heparin level = 0.38, therapeutic this AM on heparin drip 1600 units/hr.  INR = 1.6 (slow to increase, higher dose of 10 mg given 3/10; will given again today) Hgb low/stable at 9.6 and pltc wnl. No bleeding noted.  PTA: 5 mg daily (however, did not appear to be therapeutic on this dose)  Goal of Therapy:  Heparin level = 0.3-0.7 units/ml INR 2-3 Monitor platelets by anticoagulation protocol: Yes   Plan:  Continue IV heparin drip 1600 units/hr, no bolus. Daily hep level, cbc, INR Warfarin 10mg  po x1  Sloan Leiter, PharmD, BCPS, BCCCP Clinical Pharmacist Please refer to [facility] for [place] numbers 01/18/2019, 8:04 AM

## 2019-01-18 NOTE — Progress Notes (Signed)
[facility] PROGRESS NOTE    Progress Note  Leonard Bentley  KGM:[id number] DOB: 1961-03-08 DOA: 01/09/2019 PCP: [name], MD     Brief Narrative:   Leonard Bentley is an 58 y.o. male has medical history of recent recurrent PEs, alcohol abuse tobacco peripheral vascular disease as well as non-Hodgkin's lymphoma who was sent to the ER for acute osteomyelitis by his orthopedic physician.  Orthopedic surgery evaluated the patient and performed a left big toe amputation.  Remains in the hospital as he is being bridge with IV heparin and Coumadin until INR becomes therapeutic for at least 24 hours.  Assessment/Plan:   Recurrent Osteomyelitis of great toe of left foot ([place]): Blood cultures have remained negative till date. Orthopedic surgery was consulted on 01/12/2019 subsequently underwent left big toe amputation no apparent intraoperative complications. AntiBiotics stopped on 01/14/2019  History of recurrent pulmonary emboli: Continue IV heparin and Coumadin INR subtherapeutic. We will need to overlap with IV heparin for 24 hours once INR therapeutic. No signs of overt bleeding.  History of alcohol abuse: He has been stable no signs of withdrawal continue to monitor with the CIWA protocol  Tobacco abuse: Nicotine patch placed.  Essential hypertension: Continue current regimen.  SVT: Most likely due to alcohol withdrawal patient is currently stable. Currently on no rate controlling medication at home.  Disposition: To his recurrent PEs patient requires heparin and Coumadin until INR becomes therapeutic. Patient has no payer source.   DVT prophylaxis: IV heparin and Coumadin Family Communication:none Disposition Plan/Barrier to D/C: once INR Therapeutic Code Status:     Code Status Orders  (From admission, onward)         Start     Ordered   01/09/19 2321  Full code  Continuous     01/09/19 2320        Code Status History    Date Active Date Inactive Code  Status Order ID Comments User Context   12/27/2018 1619 12/31/2018 1428 Full Code [id number]  Oswald Hillock, MD ED   12/07/2018 2253 12/10/2018 1723 Full Code [id number]  [name], MD ED   11/27/2018 0528 11/28/2018 1548 Full Code [id number]  [name], MD ED   11/02/2018 0135 11/08/2018 1511 Full Code [id number]  [name], MD ED   11/02/2018 0053 11/02/2018 0135 Full Code [id number]  [name], MD ED   08/30/2018 2305 09/01/2018 0042 Full Code [id number]  [name], MD Inpatient   08/30/2018 1620 08/30/2018 2305 Full Code [id number]  [name], PA-C ED   07/25/2018 1557 07/30/2018 1714 Full Code [id number]  [name], MD ED   06/04/2018 2302 06/13/2018 0204 Full Code [id number]  [name], MD Inpatient   11/27/2016 2247 11/28/2016 1715 Full Code [id number]  [name], MD Inpatient   10/27/2016 1420 10/31/2016 1519 Full Code [id number]  [name], MD Inpatient   08/13/2016 0855 08/13/2016 1559 Full Code [id number]  [name], MD Inpatient   04/02/2016 1736 04/03/2016 1820 Full Code [id number]  [name], NP Inpatient   01/31/2013 0615 02/02/2013 1800 Full Code [id number]  [name], MD Inpatient   01/30/2013 2133 01/31/2013 0615 Full Code [id number]  [name], MD ED   12/28/2012 1235 01/01/2013 1336 Full Code [id number]  [name], MD ED   08/24/2012 0423 08/25/2012 1426 Full Code [id number]  [name]., DO ED        IV Access:    Peripheral  IV   Procedures and diagnostic studies:   No results found.   Medical Consultants:    None.  Anti-Infectives:   None  Subjective:    Leonard Bentley relates his pain is controlled by oxycodone.  Objective:    Vitals:   01/17/19 1346 01/17/19 1553 01/17/19 2103 01/18/19 0357  BP: (!) 141/117 (!) 133/107 138/86 (!) 145/107  Pulse: 74 94 91 80  Resp:  16 17 16   Temp:   98 F (36.7 C) 98.6 F (37 C)  TempSrc:   Oral Oral  SpO2:   100% 100%   Weight:      Height:        Intake/Output Summary (Last 24 hours) at 01/18/2019 1131 Last data filed at 01/18/2019 0500 Gross per 24 hour  Intake 831.72 ml  Output 450 ml  Net 381.72 ml   Filed Weights   01/11/19 1637  Weight: 84.8 kg    Exam: General exam: In no acute distress. Respiratory system: Good air movement and clear to auscultation. Cardiovascular system: S1 & S2 heard, RRR.  Gastrointestinal system: Abdomen is nondistended, soft and nontender.  Central nervous system: Alert and oriented. No focal neurological deficits. Extremities:  left foot is wrapped Skin: No rashes, lesions or ulcers Psychiatry: Judgement and insight appear normal. Mood & affect appropriate.    Data Reviewed:    Labs: Basic Metabolic Panel: No results for input(s): NA, K, CL, CO2, GLUCOSE, BUN, CREATININE, CALCIUM, MG, PHOS in the last 168 hours. GFR Estimated Creatinine Clearance: 82.9 mL/min (by C-G formula based on SCr of 0.96 mg/dL). Liver Function Tests: No results for input(s): AST, ALT, ALKPHOS, BILITOT, PROT, ALBUMIN in the last 168 hours. No results for input(s): LIPASE, AMYLASE in the last 168 hours. No results for input(s): AMMONIA in the last 168 hours. Coagulation profile Recent Labs  Lab 01/15/19 0333 01/15/19 1342 01/16/19 0306 01/17/19 0430 01/18/19 0340  INR 1.3* 1.3* 1.4* 1.6* 1.6*    CBC: Recent Labs  Lab 01/14/19 0526 01/15/19 0333 01/16/19 0306 01/17/19 0430 01/18/19 0340  WBC 4.2 3.9* 3.9* 3.6* 3.6*  HGB 9.1* 9.2* 9.4* 9.2* 9.6*  HCT 28.0* 28.9* 29.9* 28.2* 30.6*  MCV 82.1 81.9 81.5 81.5 81.6  PLT 172 165 190 190 188   Cardiac Enzymes: No results for input(s): CKTOTAL, CKMB, CKMBINDEX, TROPONINI in the last 168 hours. BNP (last 3 results) No results for input(s): PROBNP in the last 8760 hours. CBG: Recent Labs  Lab 01/12/19 1014  GLUCAP 87   D-Dimer: No results for input(s): DDIMER in the last 72 hours. Hgb A1c: No results for input(s):  HGBA1C in the last 72 hours. Lipid Profile: No results for input(s): CHOL, HDL, LDLCALC, TRIG, CHOLHDL, LDLDIRECT in the last 72 hours. Thyroid function studies: No results for input(s): TSH, T4TOTAL, T3FREE, THYROIDAB in the last 72 hours.  Invalid input(s): FREET3 Anemia work up: No results for input(s): VITAMINB12, FOLATE, FERRITIN, TIBC, IRON, RETICCTPCT in the last 72 hours. Sepsis Labs: Recent Labs  Lab 01/15/19 0333 01/16/19 0306 01/17/19 0430 01/18/19 0340  WBC 3.9* 3.9* 3.6* 3.6*   Microbiology Recent Results (from the past 240 hour(s))  Culture, blood (routine x 2)     Status: None   Collection Time: 01/09/19  6:27 PM  Result Value Ref Range Status   Specimen Description   Final    BLOOD LEFT ANTECUBITAL Performed at [facility], [place] [address] [place], [place] [address]    Special Requests   Final  BOTTLES DRAWN AEROBIC AND ANAEROBIC Blood Culture adequate volume Performed at [place] [address] [place], [place] [address]    Culture   Final    NO GROWTH 5 DAYS Performed at [facility], [place] [address] [place], [place] [address]    Report Status 01/14/2019 FINAL  Final  Culture, blood (routine x 2)     Status: None   Collection Time: 01/09/19  6:50 PM  Result Value Ref Range Status   Specimen Description   Final    BLOOD LEFT ANTECUBITAL Performed at [place] [address] [place], [place] [address]    Special Requests   Final    BOTTLES DRAWN AEROBIC AND ANAEROBIC Blood Culture adequate volume Performed at [place] [address], [place], [place] [address]    Culture   Final    NO GROWTH 5 DAYS Performed at [facility], [place] [address], [place], [place] [address]    Report Status 01/14/2019 FINAL  Final  Surgical PCR screen     Status: None   Collection Time: 01/12/19  7:25 AM  Result Value Ref Range Status   MRSA, PCR NEGATIVE NEGATIVE  Final   Staphylococcus aureus NEGATIVE NEGATIVE Final    Comment: (NOTE) The Xpert SA Assay (FDA approved for NASAL specimens in patients 42 years of age and older), is one component of a comprehensive surveillance program. It is not intended to diagnose infection nor to guide or monitor treatment. Performed at [facility], [place] [address], [place],  [address]      Medications:   . calcium carbonate  1 tablet Oral TID  . docusate sodium  100 mg Oral BID  . feeding supplement (ENSURE ENLIVE)  237 mL Oral BID BM  . feeding supplement (PRO-STAT SUGAR FREE 64)  30 mL Oral BID  . nicotine  21 mg Transdermal Daily  . thiamine  100 mg Oral Daily   Or  . thiamine  100 mg Intravenous Daily  . warfarin  10 mg Oral ONCE-1800  . Warfarin - Pharmacist Dosing Inpatient   Does not apply q1800   Continuous Infusions: . sodium chloride 10 mL/hr at 01/17/19 0823  . sodium chloride 10 mL/hr at 01/12/19 1125  . sodium chloride 10 mL/hr at 01/12/19 1545  . heparin 1,600 Units/hr (01/18/19 0500)  . lactated ringers 10 mL/hr at 01/12/19 0838  . methocarbamol (ROBAXIN) IV       LOS: 9 days   Charlynne Cousins  [facility]  01/18/2019, 11:31 AM

## 2019-01-19 LAB — CBC
HCT: 30 % — ABNORMAL LOW (ref 39.0–52.0)
Hemoglobin: 9.6 g/dL — ABNORMAL LOW (ref 13.0–17.0)
MCH: 26.1 pg (ref 26.0–34.0)
MCHC: 32 g/dL (ref 30.0–36.0)
MCV: 81.5 fL (ref 80.0–100.0)
Platelets: 210 10*3/uL (ref 150–400)
RBC: 3.68 MIL/uL — ABNORMAL LOW (ref 4.22–5.81)
RDW: 15.3 % (ref 11.5–15.5)
WBC: 3.8 10*3/uL — ABNORMAL LOW (ref 4.0–10.5)
nRBC: 0 % (ref 0.0–0.2)

## 2019-01-19 LAB — HEPARIN LEVEL (UNFRACTIONATED): Heparin Unfractionated: 0.44 IU/mL (ref 0.30–0.70)

## 2019-01-19 LAB — PROTIME-INR
INR: 1.7 — ABNORMAL HIGH (ref 0.8–1.2)
Prothrombin Time: 19.4 seconds — ABNORMAL HIGH (ref 11.4–15.2)

## 2019-01-19 MED ORDER — WARFARIN SODIUM 5 MG PO TABS
10.0000 mg | ORAL_TABLET | Freq: Once | ORAL | Status: AC
  Administered 2019-01-19: 10 mg via ORAL
  Filled 2019-01-19: qty 2

## 2019-01-19 NOTE — Progress Notes (Signed)
Subjective: 7 Days Post-Op Procedure(s) (LRB): LEFT GREAT TOE AMPUTATION (Left) Patient reports pain as mild.    Objective: Vital signs in last 24 hours: Temp:  [98.3 F (36.8 C)-98.6 F (37 C)] 98.6 F (37 C) (03/13 0436) Pulse Rate:  [69-78] 76 (03/13 0436) Resp:  [12-20] 20 (03/13 0436) BP: (127-157)/(98-109) 157/109 (03/13 0436) SpO2:  [98 %-100 %] 100 % (03/13 0436)  Intake/Output from previous [place]: 03/12 0701 - 03/13 0700 In: -  Out: 650 [Urine:650] Intake/Output this shift: No intake/output data recorded.  Recent Labs    01/17/19 0430 01/18/19 0340 01/19/19 0144  HGB 9.2* 9.6* 9.6*   Recent Labs    01/18/19 0340 01/19/19 0144  WBC 3.6* 3.8*  RBC 3.75* 3.68*  HCT 30.6* 30.0*  PLT 188 210   No results for input(s): NA, K, CL, CO2, BUN, CREATININE, GLUCOSE, CALCIUM in the last 72 hours. Recent Labs    01/18/19 0340 01/19/19 0144  INR 1.6* 1.7*    Operative dressings removed and left great toe amputation site is healing well without drainage and sutures intact. No signs of cellulitis. Mild edema.    Assessment/Plan: 7 Days Post-Op Procedure(s) (LRB): LEFT GREAT TOE AMPUTATION (Left) Up with therapy Daily dry dressing to the left great toe amputation site and Ace wrapping.  Elevate and off load the left foot as much as possible.  Follow up in the office in 1 week following discharge.      Erlinda Hong , PA-C 01/19/2019, 9:01 AM  [place]

## 2019-01-19 NOTE — Progress Notes (Signed)
[place] for warfarin with IV Heparin bridge Indication: hx recurrent PE  Allergies  Allergen Reactions  . Other Other (See Comments)    "Cactus" blisters on tongue    Patient Measurements: Height: 5\' 9"  (175.3 cm) Weight: 187 lb (84.8 kg) IBW/kg (Calculated) : 70.7  Vital Signs: Temp: 98.6 F (37 C) (03/13 0436) Temp Source: Oral (03/13 0436) BP: 157/109 (03/13 0436) Pulse Rate: 76 (03/13 0436)  Labs: Recent Labs    01/17/19 0430 01/18/19 0340 01/19/19 0144  HGB 9.2* 9.6* 9.6*  HCT 28.2* 30.6* 30.0*  PLT 190 188 210  LABPROT 19.1* 18.6* 19.4*  INR 1.6* 1.6* 1.7*  HEPARINUNFRC 0.47 0.38 0.44    Estimated Creatinine Clearance: 82.9 mL/min (by C-G formula based on SCr of 0.96 mg/dL).   Assessment: 12 YOM with h/o recurrent PE who presented to [facility] with osteomyelitis of left big toe. Patient was on warfarin at home which was discontinued for surgery.  s/p left great toe amputation on 01/12/19. Pharmacy consulted to resume warfarin 3/6.  Restarted IV heparin bridge 01/13/19, no bolus.   Heparin level = 0.44, therapeutic this AM on heparin drip 1600 units/hr.  INR = 1.7 (slow to increase, higher dose of 10 mg given 3/10 and 3/12; will given again today) Hgb low/stable at 9.6 and pltc wnl. No bleeding noted.  PTA: 5 mg daily (however, did not appear to be therapeutic on this dose)  Goal of Therapy:  Heparin level = 0.3-0.7 units/ml INR 2-3 Monitor platelets by anticoagulation protocol: Yes   Plan:  Continue IV heparin drip 1600 units/hr, no bolus. Daily hep level, cbc, INR Warfarin 10mg  po x1 again tonight  Sloan Leiter, PharmD, BCPS, BCCCP Clinical Pharmacist Please refer to [facility] for [place] numbers 01/19/2019, 7:50 AM

## 2019-01-19 NOTE — Discharge Instructions (Signed)
Keep left foot elevated as much as possible and avoid weight bearing on the area as much as possible. You may put weight through the left heel for balance.

## 2019-01-19 NOTE — Progress Notes (Signed)
[facility] PROGRESS NOTE    Progress Note  Leonard Bentley  VVZ:[id number] DOB: [birth year] DOA: 01/09/2019 PCP: [name], MD     Brief Narrative:   Leonard Bentley is an 58 y.o. male has medical history of recent recurrent PEs, alcohol abuse tobacco peripheral vascular disease as well as non-Hodgkin's lymphoma who was sent to the ER for acute osteomyelitis by his orthopedic physician.  Orthopedic surgery evaluated the patient and performed a left big toe amputation.  Remains in the hospital as he is being bridge with IV heparin and Coumadin until INR becomes therapeutic for at least 24 hours.  Assessment/Plan:   Recurrent Osteomyelitis of great toe of left foot ([place]): Blood cultures have remained negative till date. Orthopedic surgery was consulted on 01/12/2019 subsequently underwent left big toe amputation no apparent intraoperative complications. AntiBiotics stopped on 01/14/2019  History of recurrent pulmonary emboli: Continue IV heparin and Coumadin INR subtherapeutic. We will need to overlap with IV heparin for 24 hours once INR therapeutic. No signs of overt bleeding. Goal INR 2-3.  History of alcohol abuse: He has been stable no signs of withdrawal continue to monitor with the CIWA protocol  Tobacco abuse: Nicotine patch placed.  Essential hypertension: Continue current regimen.  SVT: Most likely due to alcohol withdrawal patient is currently stable. Currently on no rate controlling medication at home.  Disposition: To his recurrent PEs patient requires heparin and Coumadin until INR becomes therapeutic. Patient has no payer source.   DVT prophylaxis: IV heparin and Coumadin Family Communication:none Disposition Plan/Barrier to D/C: once INR Therapeutic Code Status:     Code Status Orders  (From admission, onward)         Start     Ordered   01/09/19 2321  Full code  Continuous     01/09/19 2320        Code Status History    Date Active Date  Inactive Code Status Order ID Comments User Context   12/27/2018 1619 12/31/2018 1428 Full Code [id number]  Oswald Hillock, MD ED   12/07/2018 2253 12/10/2018 1723 Full Code [id number]  [name], MD ED   11/27/2018 0528 11/28/2018 1548 Full Code [id number]  [name], MD ED   11/02/2018 0135 11/08/2018 1511 Full Code [id number]  [name], MD ED   11/02/2018 0053 11/02/2018 0135 Full Code [id number]  [name], MD ED   08/30/2018 2305 09/01/2018 0042 Full Code [id number]  [name], MD Inpatient   08/30/2018 1620 08/30/2018 2305 Full Code [id number]  [name], PA-C ED   07/25/2018 1557 07/30/2018 1714 Full Code [id number]  [name], MD ED   06/04/2018 2302 06/13/2018 0204 Full Code [id number]  [name], MD Inpatient   11/27/2016 2247 11/28/2016 1715 Full Code [id number]  [name], MD Inpatient   10/27/2016 1420 10/31/2016 1519 Full Code [id number]  [name], MD Inpatient   08/13/2016 0855 08/13/2016 1559 Full Code [id number]  [name], MD Inpatient   04/02/2016 1736 04/03/2016 1820 Full Code [id number]  [name], NP Inpatient   01/31/2013 0615 02/02/2013 1800 Full Code [id number]  [name], MD Inpatient   01/30/2013 2133 01/31/2013 0615 Full Code [id number]  [name], MD ED   12/28/2012 1235 01/01/2013 1336 Full Code [id number]  [name], MD ED   08/24/2012 0423 08/25/2012 1426 Full Code [id number]  [name]., DO ED        IV Access:  Peripheral IV   Procedures and diagnostic studies:   No results found.   Medical Consultants:    None.  Anti-Infectives:   None  Subjective:    Leonard Bentley relates his pain is controlled by oxycodone.  Objective:    Vitals:   01/18/19 0357 01/18/19 1757 01/18/19 2031 01/19/19 0436  BP: (!) 145/107 (!) 127/106 (!) 138/98 (!) 157/109  Pulse: 80 69 78 76  Resp: 16 12 20 20   Temp: 98.6 F (37 C) 98.3 F (36.8 C) 98.3 F (36.8 C) 98.6  F (37 C)  TempSrc: Oral Oral Oral Oral  SpO2: 100% 98% 99% 100%  Weight:      Height:        Intake/Output Summary (Last 24 hours) at 01/19/2019 1036 Last data filed at 01/19/2019 0900 Gross per 24 hour  Intake 240 ml  Output 650 ml  Net -410 ml   Filed Weights   01/11/19 1637  Weight: 84.8 kg    Exam: General exam: In no acute distress. Respiratory system: Good air movement and clear to auscultation. Cardiovascular system: S1 & S2 heard, RRR.  Gastrointestinal system: Abdomen is nondistended, soft and nontender.  Central nervous system: Alert and oriented. No focal neurological deficits. Extremities:  left foot is wrapped Skin: No rashes, lesions or ulcers Psychiatry: Judgement and insight appear normal. Mood & affect appropriate.    Data Reviewed:    Labs: Basic Metabolic Panel: No results for input(s): NA, K, CL, CO2, GLUCOSE, BUN, CREATININE, CALCIUM, MG, PHOS in the last 168 hours. GFR Estimated Creatinine Clearance: 82.9 mL/min (by C-G formula based on SCr of 0.96 mg/dL). Liver Function Tests: No results for input(s): AST, ALT, ALKPHOS, BILITOT, PROT, ALBUMIN in the last 168 hours. No results for input(s): LIPASE, AMYLASE in the last 168 hours. No results for input(s): AMMONIA in the last 168 hours. Coagulation profile Recent Labs  Lab 01/15/19 1342 01/16/19 0306 01/17/19 0430 01/18/19 0340 01/19/19 0144  INR 1.3* 1.4* 1.6* 1.6* 1.7*    CBC: Recent Labs  Lab 01/15/19 0333 01/16/19 0306 01/17/19 0430 01/18/19 0340 01/19/19 0144  WBC 3.9* 3.9* 3.6* 3.6* 3.8*  HGB 9.2* 9.4* 9.2* 9.6* 9.6*  HCT 28.9* 29.9* 28.2* 30.6* 30.0*  MCV 81.9 81.5 81.5 81.6 81.5  PLT 165 190 190 188 210   Cardiac Enzymes: No results for input(s): CKTOTAL, CKMB, CKMBINDEX, TROPONINI in the last 168 hours. BNP (last 3 results) No results for input(s): PROBNP in the last 8760 hours. CBG: Recent Labs  Lab 01/12/19 1014  GLUCAP 87   D-Dimer: No results for input(s):  DDIMER in the last 72 hours. Hgb A1c: No results for input(s): HGBA1C in the last 72 hours. Lipid Profile: No results for input(s): CHOL, HDL, LDLCALC, TRIG, CHOLHDL, LDLDIRECT in the last 72 hours. Thyroid function studies: No results for input(s): TSH, T4TOTAL, T3FREE, THYROIDAB in the last 72 hours.  Invalid input(s): FREET3 Anemia work up: No results for input(s): VITAMINB12, FOLATE, FERRITIN, TIBC, IRON, RETICCTPCT in the last 72 hours. Sepsis Labs: Recent Labs  Lab 01/16/19 0306 01/17/19 0430 01/18/19 0340 01/19/19 0144  WBC 3.9* 3.6* 3.6* 3.8*   Microbiology Recent Results (from the past 240 hour(s))  Culture, blood (routine x 2)     Status: None   Collection Time: 01/09/19  6:27 PM  Result Value Ref Range Status   Specimen Description   Final    BLOOD LEFT ANTECUBITAL Performed at [facility], [place] [address], [place], [place] [address]  Special Requests   Final    BOTTLES DRAWN AEROBIC AND ANAEROBIC Blood Culture adequate volume Performed at [facility] [address] [place], [place] [address]    Culture   Final    NO GROWTH 5 DAYS Performed at [facility], [place] [address], [place], [place] [address]    Report Status 01/14/2019 FINAL  Final  Culture, blood (routine x 2)     Status: None   Collection Time: 01/09/19  6:50 PM  Result Value Ref Range Status   Specimen Description   Final    BLOOD LEFT ANTECUBITAL Performed at [facility] [address], [place], [place] [address]    Special Requests   Final    BOTTLES DRAWN AEROBIC AND ANAEROBIC Blood Culture adequate volume Performed at [facility] [address] [place], [place] [address]    Culture   Final    NO GROWTH 5 DAYS Performed at [facility], [place] [address], [place], [place] [address]    Report Status 01/14/2019 FINAL  Final  Surgical PCR screen     Status: None   Collection Time: 01/12/19  7:25  AM  Result Value Ref Range Status   MRSA, PCR NEGATIVE NEGATIVE Final   Staphylococcus aureus NEGATIVE NEGATIVE Final    Comment: (NOTE) The Xpert SA Assay (FDA approved for NASAL specimens in patients 6 years of age and older), is one component of a comprehensive surveillance program. It is not intended to diagnose infection nor to guide or monitor treatment. Performed at [facility], [place] [address] [place], [place] [address]      Medications:   . calcium carbonate  1 tablet Oral TID  . docusate sodium  100 mg Oral BID  . feeding supplement (ENSURE ENLIVE)  237 mL Oral BID BM  . feeding supplement (PRO-STAT SUGAR FREE 64)  30 mL Oral BID  . nicotine  21 mg Transdermal Daily  . thiamine  100 mg Oral Daily   Or  . thiamine  100 mg Intravenous Daily  . warfarin  10 mg Oral ONCE-1800  . Warfarin - Pharmacist Dosing Inpatient   Does not apply q1800   Continuous Infusions: . sodium chloride 10 mL/hr at 01/17/19 0823  . sodium chloride 10 mL/hr at 01/12/19 1125  . sodium chloride 10 mL/hr at 01/12/19 1545  . heparin 1,600 Units/hr (01/18/19 1623)  . lactated ringers 10 mL/hr at 01/12/19 0838  . methocarbamol (ROBAXIN) IV       LOS: 10 days   Charlynne Cousins  [facility]  01/19/2019, 10:36 AM

## 2019-01-19 NOTE — Progress Notes (Addendum)
Physical Therapy Treatment Patient Details Name: Leonard Bentley MRN: [id number] DOB: [birth year] Today's Date: 01/19/2019    History of Present Illness Pt is 58 yo male who presents for L great toe amp due to osteomyelitis. PMH: EtOH abuse, HTN, Non Hodgkins, PE, Tachycardia, MVR.     PT Comments    Patient seen for mobility progression. Pt continues to have difficulty maintaining TDWB status. Pt is able to state precautions however unable to process how weight bearing status pertains to functional mobility. Pt is able to maintain TDWB for short distances but is unsteady likely due to shakiness/tremors. Pt is reliant on RW for safe mobility and pt reports not being able to fit RW through his home. Pt encouraged to ask family/friends for assistance at d/c. Continue to progress as tolerated.    Follow Up Recommendations  SNF     Equipment Recommendations  Rolling walker with 5" wheels;Other (comment)(tub transfer bench per OT)    Recommendations for Other Services       Precautions / Restrictions Precautions Precautions: Fall Required Braces or Orthoses: Other Brace Other Brace: post op shoe  Restrictions Weight Bearing Restrictions: Yes LLE Weight Bearing: Touchdown weight bearing    Mobility  Bed Mobility Overal bed mobility: Modified Independent                Transfers Overall transfer level: Needs assistance Equipment used: Rolling walker (2 wheeled) Transfers: Sit to/from Stand Sit to Stand: Min guard         General transfer comment: min guard for safety; cues for positioning to maintain TDWB status  Ambulation/Gait Ambulation/Gait assistance: Min assist;Min guard Gait Distance (Feet): 80 Feet Assistive device: Rolling walker (2 wheeled) Gait Pattern/deviations: Step-to pattern;Decreased stance time - left;Decreased step length - right;Decreased weight shift to left Gait velocity: decreased   General Gait Details: max cues required for maintaining TDWB  status; pt is able to maintain for short distances but is unsteady when attempting to hop on R foot likely due to shakiness/tremors; distance limited by therapist due to inability to maintain weight bearing status; pt unable to adress need for rest break    Stairs             Wheelchair Mobility    Modified [place] (Stroke Patients Only)       Balance Overall balance assessment: Needs assistance   Sitting balance-Leahy Scale: Good       Standing balance-Leahy Scale: Poor Standing balance comment: reliant on external support                            Cognition Arousal/Alertness: Awake/alert Behavior During Therapy: WFL for tasks assessed/performed Overall Cognitive Status: No family/caregiver present to determine baseline cognitive functioning                                 General Comments: pt is able to state what precautions are however is unable to process how precautions pertain to functional mobility/ADLs and is unable to maintain precautions       Exercises      General Comments        Pertinent Vitals/Pain Faces Pain Scale: Hurts little more Pain Location: L foot Pain Descriptors / Indicators: Aching    Home Living                      Prior Function  PT Goals (current goals can now be found in the care plan section) Acute Rehab PT Goals Patient Stated Goal: Hopes to be able to manage at home Progress towards PT goals: Progressing toward goals    Frequency    Min 3X/week      PT Plan Current plan remains appropriate    Co-evaluation              AM-PAC PT "6 Clicks" Mobility   Outcome Measure  Help needed turning from your back to your side while in a flat bed without using bedrails?: None Help needed moving from lying on your back to sitting on the side of a flat bed without using bedrails?: None Help needed moving to and from a bed to a chair (including a wheelchair)?: None Help  needed standing up from a chair using your arms (e.g., wheelchair or bedside chair)?: A Little Help needed to walk in hospital room?: A Little Help needed climbing 3-5 steps with a railing? : A Lot 6 Click Score: 20    End of Session Equipment Utilized During Treatment: Gait belt Activity Tolerance: Patient tolerated treatment well Patient left: with call bell/phone within reach;in chair Nurse Communication: Mobility status PT Visit Diagnosis: Pain;Difficulty in walking, not elsewhere classified (R26.2) Pain - Right/Left: Left Pain - part of body: Ankle and joints of foot     Time: 7124-5809 PT Time Calculation (min) (ACUTE ONLY): 23 min  Charges:  $Gait Training: 23-37 mins                     Earney Navy, PTA Acute Rehabilitation Services Pager: [phone] Office: [phone]     Darliss Cheney 01/19/2019, 4:33 PM

## 2019-01-20 LAB — CBC
HCT: 31.1 % — ABNORMAL LOW (ref 39.0–52.0)
Hemoglobin: 10.1 g/dL — ABNORMAL LOW (ref 13.0–17.0)
MCH: 26.4 pg (ref 26.0–34.0)
MCHC: 32.5 g/dL (ref 30.0–36.0)
MCV: 81.4 fL (ref 80.0–100.0)
Platelets: 203 10*3/uL (ref 150–400)
RBC: 3.82 MIL/uL — ABNORMAL LOW (ref 4.22–5.81)
RDW: 15.2 % (ref 11.5–15.5)
WBC: 3.8 10*3/uL — ABNORMAL LOW (ref 4.0–10.5)
nRBC: 0 % (ref 0.0–0.2)

## 2019-01-20 LAB — HEPARIN LEVEL (UNFRACTIONATED): Heparin Unfractionated: 0.62 IU/mL (ref 0.30–0.70)

## 2019-01-20 LAB — PROTIME-INR
INR: 1.8 — ABNORMAL HIGH (ref 0.8–1.2)
Prothrombin Time: 20.8 seconds — ABNORMAL HIGH (ref 11.4–15.2)

## 2019-01-20 MED ORDER — WARFARIN SODIUM 7.5 MG PO TABS
12.5000 mg | ORAL_TABLET | Freq: Once | ORAL | Status: AC
  Administered 2019-01-20: 12.5 mg via ORAL
  Filled 2019-01-20: qty 1

## 2019-01-20 NOTE — Progress Notes (Signed)
Orthopedics  When up had some bleeding thru dressing. Rewrapped by nurses. No rebleed.  Pt will elevate foot when not OOB to BR.

## 2019-01-20 NOTE — Progress Notes (Signed)
[place] for warfarin with IV Heparin bridge Indication: hx recurrent PE  Allergies  Allergen Reactions  . Other Other (See Comments)    "Cactus" blisters on tongue    Patient Measurements: Height: 5\' 9"  (175.3 cm) Weight: 187 lb (84.8 kg) IBW/kg (Calculated) : 70.7  Vital Signs: Temp: 98.4 F (36.9 C) (03/14 0554) Temp Source: Oral (03/14 0554) BP: 134/101 (03/14 0554) Pulse Rate: 70 (03/14 0554)  Labs: Recent Labs    01/18/19 0340 01/19/19 0144 01/20/19 0413  HGB 9.6* 9.6* 10.1*  HCT 30.6* 30.0* 31.1*  PLT 188 210 203  LABPROT 18.6* 19.4* 20.8*  INR 1.6* 1.7* 1.8*  HEPARINUNFRC 0.38 0.44 0.62    Estimated Creatinine Clearance: 82.9 mL/min (by C-G formula based on SCr of 0.96 mg/dL).   Assessment: 19 YOM with h/o recurrent PE who presented to [facility] with osteomyelitis of left big toe. Patient was on warfarin at home which was discontinued for surgery.  s/p left great toe amputation on 01/12/19. Pharmacy consulted to resume warfarin 3/6.  Restarted IV heparin bridge 01/13/19, no bolus.   Heparin level = 0.62, therapeutic this AM on heparin drip 1600 units/hr.  INR = 1.8 (slow to increase) Hgb low/stable at 9.6 and pltc wnl. No bleeding noted.  PTA: 5 mg daily (however, did not appear to be therapeutic on this dose)  Goal of Therapy:  Heparin level = 0.3-0.7 units/ml INR 2-3 Monitor platelets by anticoagulation protocol: Yes   Plan:  Continue IV heparin drip 1600 units/hr, no bolus. Daily hep level, cbc, INR Warfarin 12.5 mg po x1 again tonight  Sloan Leiter, PharmD, BCPS, BCCCP Clinical Pharmacist Please refer to [facility] for [place] numbers 01/20/2019, 7:15 AM

## 2019-01-20 NOTE — Progress Notes (Signed)
[facility] PROGRESS NOTE    Progress Note  Leonard Bentley  WUJ:[id number] DOB: 02-Jun-1961 DOA: 01/09/2019 PCP: [name], MD     Brief Narrative:   Leonard Bentley is an 58 y.o. male has medical history of recent recurrent PEs, alcohol abuse tobacco peripheral vascular disease as well as non-Hodgkin's lymphoma who was sent to the ER for acute osteomyelitis by his orthopedic physician.  Orthopedic surgery evaluated the patient and performed a left big toe amputation.  Remains in the hospital as he is being bridge with IV heparin and Coumadin until INR becomes therapeutic for at least 24 hours.  Assessment/Plan:   Recurrent Osteomyelitis of great toe of left foot ([place]): Blood cultures have remained negative till date. Orthopedic surgery was consulted on 01/12/2019 subsequently underwent left big toe amputation no apparent intraoperative complications. AntiBiotics stopped on 01/14/2019  History of recurrent pulmonary emboli: Continue IV heparin and Coumadin INR subtherapeutic. We will need to overlap with IV heparin for 24 hours once INR therapeutic. No signs of overt bleeding. Goal INR 2-3.  History of alcohol abuse: He has been stable no signs of withdrawal continue to monitor with the CIWA protocol  Tobacco abuse: Nicotine patch placed.  Essential hypertension: Continue current regimen.  SVT: Most likely due to alcohol withdrawal patient is currently stable. Currently on no rate controlling medication at home.  Disposition: To his recurrent PEs patient requires heparin and Coumadin until INR becomes therapeutic. Patient has no payer source.   DVT prophylaxis: IV heparin and Coumadin Family Communication:none Disposition Plan/Barrier to D/C: once INR Therapeutic Code Status:     Code Status Orders  (From admission, onward)         Start     Ordered   01/09/19 2321  Full code  Continuous     01/09/19 2320        Code Status History    Date Active Date  Inactive Code Status Order ID Comments User Context   12/27/2018 1619 12/31/2018 1428 Full Code [id number]  Oswald Hillock, MD ED   12/07/2018 2253 12/10/2018 1723 Full Code [id number]  [name], MD ED   11/27/2018 0528 11/28/2018 1548 Full Code [id number]  [name], MD ED   11/02/2018 0135 11/08/2018 1511 Full Code [id number]  [name], MD ED   11/02/2018 0053 11/02/2018 0135 Full Code [id number]  [name], MD ED   08/30/2018 2305 09/01/2018 0042 Full Code [id number]  [name], MD Inpatient   08/30/2018 1620 08/30/2018 2305 Full Code [id number]  [name], PA-C ED   07/25/2018 1557 07/30/2018 1714 Full Code [id number]  [name], MD ED   06/04/2018 2302 06/13/2018 0204 Full Code [id number]  [name], MD Inpatient   11/27/2016 2247 11/28/2016 1715 Full Code [id number]  [name], MD Inpatient   10/27/2016 1420 10/31/2016 1519 Full Code [id number]  [name], MD Inpatient   08/13/2016 0855 08/13/2016 1559 Full Code [id number]  [name], MD Inpatient   04/02/2016 1736 04/03/2016 1820 Full Code [id number]  [name], NP Inpatient   01/31/2013 0615 02/02/2013 1800 Full Code [id number]  [name], MD Inpatient   01/30/2013 2133 01/31/2013 0615 Full Code [id number]  [name], MD ED   12/28/2012 1235 01/01/2013 1336 Full Code [id number]  [name], MD ED   08/24/2012 0423 08/25/2012 1426 Full Code [id number]  [name]., DO ED        IV Access:  Peripheral IV   Procedures and diagnostic studies:   No results found.   Medical Consultants:    None.  Anti-Infectives:   None  Subjective:    Teryn A Pavlovic relates his pain is controlled.  Objective:    Vitals:   01/19/19 1228 01/19/19 2129 01/19/19 2145 01/20/19 0554  BP: (!) 134/100 (!) 134/102 (!) 132/95 (!) 134/101  Pulse: 95 83  70  Resp: 20 16    Temp: 98.7 F (37.1 C) 98.2 F (36.8 C)  98.4 F (36.9 C)  TempSrc: Oral Oral   Oral  SpO2: 98% 98%  100%  Weight:      Height:        Intake/Output Summary (Last 24 hours) at 01/20/2019 0840 Last data filed at 01/20/2019 0429 Gross per 24 hour  Intake 991.92 ml  Output 1100 ml  Net -108.08 ml   Filed Weights   01/11/19 1637  Weight: 84.8 kg    Exam: General exam: In no acute distress. Respiratory system: Good air movement and clear to auscultation. Cardiovascular system: S1 & S2 heard, RRR.  Gastrointestinal system: Abdomen is nondistended, soft and nontender.  Central nervous system: Alert and oriented. No focal neurological deficits. Extremities:  left foot is wrapped Skin: No rashes, lesions or ulcers Psychiatry: Judgement and insight appear normal. Mood & affect appropriate.    Data Reviewed:    Labs: Basic Metabolic Panel: No results for input(s): NA, K, CL, CO2, GLUCOSE, BUN, CREATININE, CALCIUM, MG, PHOS in the last 168 hours. GFR Estimated Creatinine Clearance: 82.9 mL/min (by C-G formula based on SCr of 0.96 mg/dL). Liver Function Tests: No results for input(s): AST, ALT, ALKPHOS, BILITOT, PROT, ALBUMIN in the last 168 hours. No results for input(s): LIPASE, AMYLASE in the last 168 hours. No results for input(s): AMMONIA in the last 168 hours. Coagulation profile Recent Labs  Lab 01/16/19 0306 01/17/19 0430 01/18/19 0340 01/19/19 0144 01/20/19 0413  INR 1.4* 1.6* 1.6* 1.7* 1.8*    CBC: Recent Labs  Lab 01/16/19 0306 01/17/19 0430 01/18/19 0340 01/19/19 0144 01/20/19 0413  WBC 3.9* 3.6* 3.6* 3.8* 3.8*  HGB 9.4* 9.2* 9.6* 9.6* 10.1*  HCT 29.9* 28.2* 30.6* 30.0* 31.1*  MCV 81.5 81.5 81.6 81.5 81.4  PLT 190 190 188 210 203   Cardiac Enzymes: No results for input(s): CKTOTAL, CKMB, CKMBINDEX, TROPONINI in the last 168 hours. BNP (last 3 results) No results for input(s): PROBNP in the last 8760 hours. CBG: No results for input(s): GLUCAP in the last 168 hours. D-Dimer: No results for input(s): DDIMER in the last 72  hours. Hgb A1c: No results for input(s): HGBA1C in the last 72 hours. Lipid Profile: No results for input(s): CHOL, HDL, LDLCALC, TRIG, CHOLHDL, LDLDIRECT in the last 72 hours. Thyroid function studies: No results for input(s): TSH, T4TOTAL, T3FREE, THYROIDAB in the last 72 hours.  Invalid input(s): FREET3 Anemia work up: No results for input(s): VITAMINB12, FOLATE, FERRITIN, TIBC, IRON, RETICCTPCT in the last 72 hours. Sepsis Labs: Recent Labs  Lab 01/17/19 0430 01/18/19 0340 01/19/19 0144 01/20/19 0413  WBC 3.6* 3.6* 3.8* 3.8*   Microbiology Recent Results (from the past 240 hour(s))  Surgical PCR screen     Status: None   Collection Time: 01/12/19  7:25 AM  Result Value Ref Range Status   MRSA, PCR NEGATIVE NEGATIVE Final   Staphylococcus aureus NEGATIVE NEGATIVE Final    Comment: (NOTE) The Xpert SA Assay (FDA approved for NASAL specimens in patients 14 years of age  and older), is one component of a comprehensive surveillance program. It is not intended to diagnose infection nor to guide or monitor treatment. Performed at [facility], [place] [address] [place], [place] [address]      Medications:   . calcium carbonate  1 tablet Oral TID  . docusate sodium  100 mg Oral BID  . feeding supplement (ENSURE ENLIVE)  237 mL Oral BID BM  . feeding supplement (PRO-STAT SUGAR FREE 64)  30 mL Oral BID  . nicotine  21 mg Transdermal Daily  . thiamine  100 mg Oral Daily   Or  . thiamine  100 mg Intravenous Daily  . warfarin  12.5 mg Oral ONCE-1800  . Warfarin - Pharmacist Dosing Inpatient   Does not apply q1800   Continuous Infusions: . sodium chloride 10 mL/hr at 01/17/19 0823  . sodium chloride 10 mL/hr at 01/12/19 1125  . sodium chloride 10 mL/hr at 01/12/19 1545  . heparin 1,600 Units/hr (01/20/19 0429)  . lactated ringers 10 mL/hr at 01/12/19 0838  . methocarbamol (ROBAXIN) IV       LOS: 11 days   Charlynne Cousins  [facility]  01/20/2019,  8:40 AM

## 2019-01-20 NOTE — Progress Notes (Signed)
Occupational Therapy Treatment Patient Details Name: Leonard Bentley MRN: [id number] DOB: December 27, 1960 Today's Date: 01/20/2019    History of present illness Pt is 58 yo male who presents for L great toe amp due to osteomyelitis. PMH: EtOH abuse, HTN, Non Hodgkins, PE, Tachycardia, MVR.    OT comments  Pt. Seen for skilled OT session.  Focus was completion of adls while maintaining wbs of LLE.  Pt. Able to complete grooming tasks at sink with good awareness of TDWB while in standing.  Educated on option of seated tasks to decrease fatigue and prevent break in precautions.  Pt. Verbalized understanding.     Follow Up Recommendations  Supervision - Intermittent;SNF    Equipment Recommendations  Tub/shower bench    Recommendations for Other Services      Precautions / Restrictions Precautions Precautions: Fall Other Brace: post op shoe  Restrictions LLE Weight Bearing: Touchdown weight bearing Other Position/Activity Restrictions: Better understanding of keeping weight off of L LE       Mobility Bed Mobility Overal bed mobility: Modified Independent                Transfers Overall transfer level: Needs assistance Equipment used: Rolling walker (2 wheeled) Transfers: Sit to/from [facility] Sit to Stand: Min guard Stand pivot transfers: Total assist            Balance                                           ADL either performed or assessed with clinical judgement   ADL Overall ADL's : Needs assistance/impaired     Grooming: Supervision/safety;Standing Grooming Details (indicate cue type and reason): discussed options of using chair at the sink in attempts for mainting wbs and reducing fall risk. pt. opted to stand, with focus on maintaining wbs LLE               Lower Body Dressing Details (indicate cue type and reason): donned B shoes with no assist           Tub/Shower Transfer Details (indicate cue type and  reason): pt. reports he will sponge bathe at home   General ADL Comments: noted improvement with wbs during standing tasks but reviewed for safety and energy conservation sitting is recommended esp. during longer tasks ie: grooming and bathing     Vision       Perception     Praxis      Cognition Arousal/Alertness: Awake/alert Behavior During Therapy: WFL for tasks assessed/performed Overall Cognitive Status: No family/caregiver present to determine baseline cognitive functioning                                          Exercises     Shoulder Instructions       General Comments      Pertinent Vitals/ Pain       Pain Score: 4  Pain Location: L foot Pain Descriptors / Indicators: Aching  Home Living                                          Prior Functioning/Environment  Frequency  Min 3X/week        Progress Toward Goals  OT Goals(current goals can now be found in the care plan section)  Progress towards OT goals: Progressing toward goals     Plan      Co-evaluation                 AM-PAC OT "6 Clicks" Daily Activity     Outcome Measure   Help from another person eating meals?: None Help from another person taking care of personal grooming?: None Help from another person toileting, which includes using toliet, bedpan, or urinal?: A Little Help from another person bathing (including washing, rinsing, drying)?: A Little Help from another person to put on and taking off regular upper body clothing?: None Help from another person to put on and taking off regular lower body clothing?: A Little 6 Click Score: 21    End of Session Equipment Utilized During Treatment: Gait belt;Rolling walker  OT Visit Diagnosis: Pain;Other abnormalities of gait and mobility (R26.89) Pain - Right/Left: Left Pain - part of body: Ankle and joints of foot   Activity Tolerance Patient tolerated treatment well    Patient Left in bed;with call bell/phone within reach   Nurse Communication          Time: 8811-0315 OT Time Calculation (min): 13 min  Charges: OT General Charges $OT Visit: 1 Visit OT Treatments $Self Care/Home Management : 8-22 mins  Janice Coffin, COTA/L 01/20/2019, 9:46 AM

## 2019-01-20 NOTE — Plan of Care (Signed)
Problem: Education: Goal: Knowledge of General Education information will improve Description Including pain rating scale, medication(s)/side effects and non-pharmacologic comfort measures Outcome: Progressing   Problem: Health Behavior/Discharge Planning: Goal: Ability to manage health-related needs will improve Outcome: Progressing   Problem: Activity: Goal: Risk for activity intolerance will decrease Outcome: Progressing   Problem: Coping: Goal: Level of anxiety will decrease Outcome: Progressing   Problem: Pain Managment: Goal: General experience of comfort will improve Outcome: Progressing   Problem: Skin Integrity: Goal: Risk for impaired skin integrity will decrease Description Risk for impaired skin integrity will decrease, skin will remain C/D I  Outcome: Progressing

## 2019-01-21 LAB — CBC
HCT: 30.9 % — ABNORMAL LOW (ref 39.0–52.0)
Hemoglobin: 9.7 g/dL — ABNORMAL LOW (ref 13.0–17.0)
MCH: 25.2 pg — ABNORMAL LOW (ref 26.0–34.0)
MCHC: 31.4 g/dL (ref 30.0–36.0)
MCV: 80.3 fL (ref 80.0–100.0)
Platelets: 216 10*3/uL (ref 150–400)
RBC: 3.85 MIL/uL — ABNORMAL LOW (ref 4.22–5.81)
RDW: 14.9 % (ref 11.5–15.5)
WBC: 3.7 10*3/uL — ABNORMAL LOW (ref 4.0–10.5)
nRBC: 0 % (ref 0.0–0.2)

## 2019-01-21 LAB — HEPARIN LEVEL (UNFRACTIONATED): Heparin Unfractionated: 0.27 IU/mL — ABNORMAL LOW (ref 0.30–0.70)

## 2019-01-21 LAB — PROTIME-INR
INR: 2 — ABNORMAL HIGH (ref 0.8–1.2)
Prothrombin Time: 22.1 seconds — ABNORMAL HIGH (ref 11.4–15.2)

## 2019-01-21 MED ORDER — POLYETHYLENE GLYCOL 3350 17 G PO PACK: 17.0000 g | PACK | Freq: Every day | ORAL | 0 refills | Status: DC | PRN

## 2019-01-21 MED ORDER — OXYCODONE HCL 10 MG PO TABS: 10.0000 mg | ORAL_TABLET | ORAL | 0 refills | Status: DC | PRN

## 2019-01-21 MED ORDER — WARFARIN SODIUM 7.5 MG PO TABS: 12.5000 mg | ORAL_TABLET | Freq: Once | ORAL | Status: DC

## 2019-01-21 NOTE — Discharge Summary (Signed)
Physician Discharge Summary  Taylor Levick Caley FUX:[id number] DOB: [birth year] DOA: 01/09/2019  PCP: [name], MD  Admit date: 01/09/2019 Discharge date: 01/21/2019  Admitted From: Home Disposition:  Home  Recommendations for Outpatient Follow-up:  1. Follow up with PCP in 1-2 weeks 2. Please obtain BMP/CBC in one week   Home Health:Yes Equipment/Devices:None  Discharge Condition:stable CODE STATUS:Full Diet recommendation: Heart Healthy  Brief/Interim Summary: 58 y.o. male has medical history of recent recurrent PEs, alcohol abuse tobacco peripheral vascular disease as well as non-Hodgkin's lymphoma who was sent to the ER for acute osteomyelitis by his orthopedic physician.  Orthopedic surgery evaluated the patient and performed a left big toe amputation.   Discharge Diagnoses:  Principal Problem:   Osteomyelitis of great toe of left foot (HCC) Active Problems:   History of non-Hodgkin's lymphoma   Alcohol abuse   Cigarette smoker   SVT (supraventricular tachycardia) (HCC)   Recurrent pulmonary emboli (HCC)   HTN (hypertension) Recurrent osteomyelitis of the great toe of the left foot: Orthopedic surgery was consulted and he underwent left big toe amputation with no apparent complications on 0/12/5425. On admission he was started empirically on antibiotics which were stopped on 01/14/2019.  History of recurrent pulmonary emboli: His Coumadin was held for surgical procedure. Then he was placed on IV heparin and oral Coumadin until his INR was a goal of 2.  History of alcohol abuse: No signs of withdrawal.  Nicotine use: Nicotine patch was placed.  Essential hypertension: No changes made to his medication.  Episode of SVT most likely due to withdrawals this resolved spontaneously.     Discharge Instructions  Discharge Instructions    Diet - low sodium heart healthy   Complete by:  As directed    Increase activity slowly   Complete by:  As directed    Touch down  weight bearing   Complete by:  As directed    Laterality:  left   Extremity:  Lower   Touch down weight bearing   Complete by:  As directed    Laterality:  left   Extremity:  Lower     Allergies as of 01/21/2019      Reactions   Other Other (See Comments)   "Cactus" blisters on tongue      Medication List    STOP taking these medications   amoxicillin-clavulanate 875-125 MG tablet Commonly known as:  Augmentin     TAKE these medications   acetaminophen 650 MG CR tablet Commonly known as:  TYLENOL Take 650 mg by mouth every 8 (eight) hours as needed for pain.   chlordiazePOXIDE 5 MG capsule Commonly known as:  LIBRIUM Take 1 capsule (5 mg total) by mouth 3 (three) times daily as needed for anxiety.   gabapentin 100 MG capsule Commonly known as:  Neurontin Take 1 capsule (100 mg total) by mouth 3 (three) times daily. What changed:  when to take this   Oxycodone HCl 10 MG Tabs Take 1-1.5 tablets (10-15 mg total) by mouth every 4 (four) hours as needed for severe pain (pain score 7-10).   polyethylene glycol packet Commonly known as:  MIRALAX / GLYCOLAX Take 17 g by mouth daily as needed for mild constipation.   warfarin 5 MG tablet Commonly known as:  COUMADIN Take as directed. If you are unsure how to take this medication, talk to your nurse or doctor. Original instructions:  Take 5 mg by mouth daily.  Discharge Care Instructions  (From admission, onward)         Start     Ordered   01/15/19 0000  Touch down weight bearing    Question Answer Comment  Laterality left   Extremity Lower      01/15/19 0701   01/12/19 0000  Touch down weight bearing    Question Answer Comment  Laterality left   Extremity Lower      01/12/19 1001         Follow-up Information    [name], MD In 1 week.   Specialty:  Orthopedic Surgery Contact information: [address] [place] [address] [phone]          Allergies   Allergen Reactions  . Other Other (See Comments)    "Cactus" blisters on tongue    Consultations:  Orhtopedics   Procedures/Studies: Dg Hand 2 View Left  Result Date: 12/28/2018 CLINICAL DATA:  Distal left index finger pain following a crush injury 3 weeks ago. EXAM: LEFT HAND - 2 VIEW COMPARISON:  None. FINDINGS: There is no evidence of fracture or dislocation. There is no evidence of arthropathy or other focal bone abnormality. Soft tissues are unremarkable. IMPRESSION: Negative. Electronically Signed   By: Claudie Revering M.D.   On: 12/28/2018 12:44   Mr Foot Left [name]  Result Date: 12/27/2018 CLINICAL DATA:  Worsening infection of the left great toe despite antibiotics. EXAM: MRI OF THE LEFT FOREFOOT WITHOUT AND WITH CONTRAST TECHNIQUE: Multiplanar, multisequence MR imaging of the left foot was performed both before and after administration of intravenous contrast. CONTRAST:  8 mm mild Gadavist IV COMPARISON:  Left foot radiographs 12/07/2018 and great toe radiographs 12/27/2018 FINDINGS: Bones/Joint/Cartilage Subtle cortical bone loss along the dorsal medial aspect of the distal phalanx of the left great toe, series 4/11. Marrow edema of the distal phalanx concerning for early changes of osteomyelitis are noted, series 8/15. No acute fracture joint dislocations. Subcortical cyst involving the lateral cuneiform is identified measuring approximately 8 x 5 x 6 mm. Ligaments Negative Muscles and Tendons No evidence of pyomyositis. Mild intramuscular edema likely neurogenic. Soft tissues Small ganglion cyst along the dorsum of the first proximal phalanx measuring approximately 11 x 6 x 9 mm. Diffuse soft tissue edema is noted consistent with cellulitis. IMPRESSION: 1. Diffuse subcutaneous soft tissue edema the forefoot consistent with history of cellulitis. 2. Subtle cortical bone loss along the dorsal medial aspect of the distal phalanx of the great toe more uniform marrow edema of the  distal phalanx raising concern for changes of osteomyelitis. 3. Small ganglion cyst along the dorsum the first proximal phalanx measuring 11 x 6 x 9 mm. 4. Degenerative subcortical cyst of the lateral cuneiform measuring 8 x 5 x 6 mm. Electronically Signed   By: Ashley Royalty M.D.   On: 12/27/2018 21:28   Dg Foot Complete Left  Result Date: 01/09/2019 CLINICAL DATA:  Great toe wound for 4 weeks. Pain and infection. EXAM: LEFT FOOT - COMPLETE 3+ VIEW COMPARISON:  Great toe radiographs and forefoot MRI 12/27/2018 FINDINGS: No bony destructive change. Equivocal decreased density involving the lateral aspect of the great toe distal tuft at site of osteomyelitis on prior MRI. No periosteal reaction or frank osseous erosions. No soft tissue air or radiopaque foreign body. Soft tissue thickening of the digit. Hammertoe deformity of the second through fifth digits. No findings elsewhere to suggest osteomyelitis. Well corticated density about the dorsal talus may represent sequela  of remote prior injury or accessory ossicle. IMPRESSION: Equivocal decreased density involving the lateral aspect of the great toe distal tuft at site of osteomyelitis on prior MRI. No frank bony destructive change. No soft tissue air or radiopaque foreign body. Electronically Signed   By: Keith Rake M.D.   On: 01/09/2019 19:07   Dg Toe Great Left  Result Date: 12/27/2018 CLINICAL DATA:  necrotic great toe, please eval for possible osteomyelitis/gasPatient reports pain in left great toe for 3 weeks. Patient states it started as blister along dorsal surface of great toe 3 weeks ago, he placed a band-aid on it and 2 weeks ago his distal great toe started to turn black. Pain is localized to great toe and does not radiate into rest of foot. No prior surgery or injury to great toe. EXAM: LEFT GREAT TOE COMPARISON:  12/07/2018 FINDINGS: There is no evidence of fracture or dislocation. No focal cortical erosion to suggest osteomyelitis. There  is no evidence of arthropathy or other focal bone abnormality. Soft tissue swelling about the distal phalanx. No subcutaneous gas or radiodense foreign body. IMPRESSION: 1. Soft tissue swelling without bony abnormality. 2. No radiographic evidence of osteomyelitis. The Electronically Signed   By: Lucrezia Europe M.D.   On: 12/27/2018 14:55      Subjective: No complains  Discharge Exam: Vitals:   01/20/19 1950 01/21/19 0613  BP: (!) 125/93 (!) 146/110  Pulse: 72 74  Resp:    Temp: 98.3 F (36.8 C) 98.8 F (37.1 C)  SpO2: 98% 98%     General: Pt is alert, awake, not in acute distress Cardiovascular: RRR, S1/S2 +, no rubs, no gallops Respiratory: CTA bilaterally, no wheezing, no rhonchi Abdominal: Soft, NT, ND, bowel sounds + Extremities: no edema, no cyanosis    The results of significant diagnostics from this hospitalization (including imaging, microbiology, ancillary and laboratory) are listed below for reference.     Microbiology: Recent Results (from the past 240 hour(s))  Surgical PCR screen     Status: None   Collection Time: 01/12/19  7:25 AM  Result Value Ref Range Status   MRSA, PCR NEGATIVE NEGATIVE Final   Staphylococcus aureus NEGATIVE NEGATIVE Final    Comment: (NOTE) The Xpert SA Assay (FDA approved for NASAL specimens in patients 20 years of age and older), is one component of a comprehensive surveillance program. It is not intended to diagnose infection nor to guide or monitor treatment. Performed at [facility], [place] [address], [place], [place] [address]      Labs: BNP (last 3 results) No results for input(s): BNP in the last 8760 hours. Basic Metabolic Panel: No results for input(s): NA, K, CL, CO2, GLUCOSE, BUN, CREATININE, CALCIUM, MG, PHOS in the last 168 hours. Liver Function Tests: No results for input(s): AST, ALT, ALKPHOS, BILITOT, PROT, ALBUMIN in the last 168 hours. No results for input(s): LIPASE, AMYLASE in the last 168 hours. No  results for input(s): AMMONIA in the last 168 hours. CBC: Recent Labs  Lab 01/17/19 0430 01/18/19 0340 01/19/19 0144 01/20/19 0413 01/21/19 0703  WBC 3.6* 3.6* 3.8* 3.8* 3.7*  HGB 9.2* 9.6* 9.6* 10.1* 9.7*  HCT 28.2* 30.6* 30.0* 31.1* 30.9*  MCV 81.5 81.6 81.5 81.4 80.3  PLT 190 188 210 203 216   Cardiac Enzymes: No results for input(s): CKTOTAL, CKMB, CKMBINDEX, TROPONINI in the last 168 hours. BNP: Invalid input(s): POCBNP CBG: No results for input(s): GLUCAP in the last 168 hours. D-Dimer No results for  input(s): DDIMER in the last 72 hours. Hgb A1c No results for input(s): HGBA1C in the last 72 hours. Lipid Profile No results for input(s): CHOL, HDL, LDLCALC, TRIG, CHOLHDL, LDLDIRECT in the last 72 hours. Thyroid function studies No results for input(s): TSH, T4TOTAL, T3FREE, THYROIDAB in the last 72 hours.  Invalid input(s): FREET3 Anemia work up No results for input(s): VITAMINB12, FOLATE, FERRITIN, TIBC, IRON, RETICCTPCT in the last 72 hours. Urinalysis    Component Value Date/Time   COLORURINE RED (A) 11/01/2018 2149   APPEARANCEUR TURBID (A) 11/01/2018 2149   LABSPEC 1.018 11/01/2018 2149   PHURINE 7.0 11/01/2018 2149   GLUCOSEU NEGATIVE 11/01/2018 2149   HGBUR MODERATE (A) 11/01/2018 2149   BILIRUBINUR NEGATIVE 11/01/2018 2149   [place] NEGATIVE 11/01/2018 2149   PROTEINUR 100 (A) 11/01/2018 2149   UROBILINOGEN 1.0 01/25/2013 0825   NITRITE NEGATIVE 11/01/2018 2149   LEUKOCYTESUR NEGATIVE 11/01/2018 2149   Sepsis Labs Invalid input(s): PROCALCITONIN,  WBC,  LACTICIDVEN Microbiology Recent Results (from the past 240 hour(s))  Surgical PCR screen     Status: None   Collection Time: 01/12/19  7:25 AM  Result Value Ref Range Status   MRSA, PCR NEGATIVE NEGATIVE Final   Staphylococcus aureus NEGATIVE NEGATIVE Final    Comment: (NOTE) The Xpert SA Assay (FDA approved for NASAL specimens in patients 20 years of age and older), is one component of a  comprehensive surveillance program. It is not intended to diagnose infection nor to guide or monitor treatment. Performed at [facility], [place] [address], [place], [place] [address]      Time coordinating discharge: 40 minutes  SIGNED:   Charlynne Cousins, MD  [facility]

## 2019-01-21 NOTE — Progress Notes (Signed)
AVS given and reviewed with pt. Medications and daily dressing change instructions discussed and reviewed. All questions answered to satisfaction. Pt verbalized understanding of information. Pt to be escorted off the unit via wheelchair by staff member.

## 2019-01-21 NOTE — Progress Notes (Signed)
Leonard Bentley for warfarin with IV Heparin bridge Indication: hx recurrent PE  Allergies  Allergen Reactions  . Other Other (See Comments)    "Cactus" blisters on tongue    Patient Measurements: Height: 5\' 9"  (175.3 cm) Weight: 187 lb (84.8 kg) IBW/kg (Calculated) : 70.7  Vital Signs: Temp: 98.8 F (37.1 C) (03/15 0613) Temp Source: Oral (03/15 0613) BP: 146/110 (03/15 0613) Pulse Rate: 74 (03/15 0613)  Labs: Recent Labs    01/19/19 0144 01/20/19 0413 01/21/19 0703  HGB 9.6* 10.1* 9.7*  HCT 30.0* 31.1* 30.9*  PLT 210 203 216  LABPROT 19.4* 20.8* 22.1*  INR 1.7* 1.8* 2.0*  HEPARINUNFRC 0.44 0.62 0.27*    Estimated Creatinine Clearance: 82.9 mL/min (by C-G formula based on SCr of 0.96 mg/dL).   Assessment: 33 YOM with h/o recurrent PE who presented to [facility] with osteomyelitis of left big toe. Patient was on warfarin at home which was discontinued for surgery.  s/p left great toe amputation on 01/12/19. Pharmacy consulted to resume warfarin 3/6.  Restarted IV heparin bridge 01/13/19, no bolus.   Heparin level =0.27 INR = 2 (slow to increase) CBC stable  PTA: 5 mg daily (however, did not appear to be therapeutic on this dose)  Goal of Therapy:  Heparin level = 0.3-0.7 units/ml INR 2-3 Monitor platelets by anticoagulation protocol: Yes   Plan:  Heparin to 1750 units / hr for 24 hour overlap Daily hep level, cbc, INR Repeat Warfarin 12.5 mg po x1 again tonight  Thank you Anette Guarneri, PharmD [phone] Please refer to [facility] for [place] numbers 01/21/2019, 11:19 AM

## 2019-01-21 NOTE — TOC Transition Note (Signed)
Transition of Care [facility]) - CM/SW Discharge Note   Patient Details  Name: Leonard Bentley MRN: [id number] Date of Birth: 1960-12-30  Transition of Care [facility] CM/SW Contact:  Claudie Leach, RN Phone Number: 01/21/2019, 2:38 PM   Clinical Narrative:   Pt to d/c home with daily dressing changes.  [facility] will contact patient tomorrow to arrange charity home health RN and PT per Dorian Pod.   Pt to do daily dressing changes with RN to provide occasional oversight and assessment.     Final next level of care: [place] Barriers to Discharge: Inadequate or no insurance, Barriers Resolved   Patient Goals and CMS Choice Patient states their goals for this hospitalization and ongoing recovery are:: to return home CMS Medicare.gov Compare Post Acute Care list provided to:: Patient Choice offered to / list presented to : NA   Discharge Plan and Services Discharge Planning Services: CM Consult Post Acute Care Choice: Home Health          DME Arranged: N/A DME Agency: NA HH Arranged: RN, PT [place] Agency: [facility] [place] Determinants of Health (SDOH) Interventions     Readmission Risk Interventions Readmission Risk Prevention Plan 01/18/2019  Transportation Screening Complete  Medication Review ([place]) Complete  HRI or [place] Complete  [place] Not Applicable  Some recent data might be hidden

## 2019-01-21 NOTE — Plan of Care (Signed)
Problem: Education: Goal: Knowledge of General Education information will improve Description Including pain rating scale, medication(s)/side effects and non-pharmacologic comfort measures Outcome: Progressing   Problem: Health Behavior/Discharge Planning: Goal: Ability to manage health-related needs will improve Outcome: Progressing   Problem: Activity: Goal: Risk for activity intolerance will decrease Outcome: Progressing   Problem: Coping: Goal: Level of anxiety will decrease Outcome: Progressing   Problem: Pain Managment: Goal: General experience of comfort will improve Outcome: Progressing   Problem: Skin Integrity: Goal: Risk for impaired skin integrity will decrease Description Risk for impaired skin integrity will decrease, skin will remain C/D I  Outcome: Progressing

## 2019-02-01 ENCOUNTER — Encounter (INDEPENDENT_AMBULATORY_CARE_PROVIDER_SITE_OTHER): Payer: Self-pay | Admitting: Orthopedic Surgery

## 2019-02-01 ENCOUNTER — Ambulatory Visit (INDEPENDENT_AMBULATORY_CARE_PROVIDER_SITE_OTHER): Payer: Self-pay | Admitting: Physician Assistant

## 2019-02-01 ENCOUNTER — Other Ambulatory Visit: Payer: Self-pay

## 2019-02-01 VITALS — Ht 69.0 in | Wt 187.0 lb

## 2019-02-01 DIAGNOSIS — Z89412 Acquired absence of left great toe: Secondary | ICD-10-CM

## 2019-02-01 MED ORDER — DOXYCYCLINE HYCLATE 100 MG PO CAPS: 100.0000 mg | ORAL_CAPSULE | Freq: Two times a day (BID) | ORAL | 1 refills | Status: DC

## 2019-02-01 MED ORDER — OXYCODONE HCL 5 MG PO CAPS: 5.0000 mg | ORAL_CAPSULE | Freq: Four times a day (QID) | ORAL | 0 refills | Status: DC | PRN

## 2019-02-01 MED ORDER — OXYCODONE HCL 5 MG PO TABS: 5.0000 mg | ORAL_TABLET | ORAL | 0 refills | Status: DC | PRN

## 2019-02-01 NOTE — Progress Notes (Signed)
Office Visit Note   Patient: Leonard Bentley           Date of Birth: May 06, 1961           MRN: [id number] Visit Date: 02/01/2019              Requested by: [name], MD [address] [place], [place] [address] PCP: [name], MD  Chief Complaint  Patient presents with  . Left Foot - Routine Post Op    01/12/2019 left great toe amputation       HPI: The patient is a 58 yo gentleman who is seen for post operative follow up following a left great toe amputation for osteomyelitis on 01/12/2019. He has been walking with a post op shoe and a single cane. He has noted some bloody drainage on his dressings and moderate pain over the area at times.    Assessment & Plan: Visit Diagnoses:  1. Status post amputation of left great toe (HCC)     Plan: Sutures were removed today.Will begin Doxycycline 100 mg BID.  The patient was instructed to begin washing the foot with Dial soap and water daily and apply a dry gauze dressing and ace wrap.  Counseled to off load the foot as much as possible and elevate as much as possible.Counseled and given written information for whey protein supplements, probiotics, vitamins and zinc given to the patient as well.  Follow  Up in 1 week.   Follow-Up Instructions: Return in about 1 week (around 02/08/2019).   Ortho Exam  Patient is alert, oriented, no adenopathy, well-dressed, normal affect, normal respiratory effort. Sutures were removed from left great toe amputation site. There is mild dehiscence medially prior to suture removal and this did not change much following suture removal. There is good dorsalis pedis pulse. Dark peri incisional necrotic skin noted, but adherent. Moderate edema and odor noted.   Imaging: No results found.   Labs: Lab Results  Component Value Date   HGBA1C 5.2 10/25/2016   HGBA1C 6.1 (H) 04/03/2016   HGBA1C 5.9 (H) 08/18/2015   LABURIC 5.2 12/09/2018   LABURIC 7.4 08/18/2015   LABURIC 4.1 12/01/2011   REPTSTATUS 01/14/2019 FINAL 01/09/2019   CULT  01/09/2019    NO GROWTH 5 DAYS Performed at [facility], [place] [address] [place], [place] [address]      Lab Results  Component Value Date   ALBUMIN 3.2 (L) 01/10/2019   ALBUMIN 3.4 (L) 12/28/2018   ALBUMIN 4.0 12/27/2018   LABURIC 5.2 12/09/2018   LABURIC 7.4 08/18/2015   LABURIC 4.1 12/01/2011    Body mass index is 27.62 kg/m.  Orders:  No orders of the defined types were placed in this encounter.  Meds ordered this encounter  Medications  . DISCONTD: oxycodone (OXY-IR) 5 MG capsule    Sig: Take 1 capsule (5 mg total) by mouth every 6 (six) hours as needed for up to 7 days for pain.    Dispense:  28 capsule    Refill:  0  . doxycycline (VIBRAMYCIN) 100 MG capsule    Sig: Take 1 capsule (100 mg total) by mouth 2 (two) times daily.    Dispense:  28 capsule    Refill:  1  . oxyCODONE (OXY IR/ROXICODONE) 5 MG immediate release tablet    Sig: Take 1 tablet (5 mg total) by mouth every 4 (four) hours as needed for severe pain.    Dispense:  30 tablet  Refill:  0     Procedures: No procedures performed  Clinical Data: No additional findings.  ROS:  All other systems negative, except as noted in the HPI. Review of Systems  Objective: Vital Signs: Ht 5\' 9"  (1.753 m)   Wt 187 lb (84.8 kg)   BMI 27.62 kg/m   Specialty Comments:  No specialty comments available.  PMFS History: Patient Active Problem List   Diagnosis Date Noted  . Osteomyelitis of great toe of left foot ([place]) 01/09/2019  . Foot ulcer, left ([place]) 12/27/2018  . Cellulitis 12/07/2018  . Bleeding from left ear 11/28/2018  . HTN (hypertension) 11/02/2018  . UTI (urinary tract infection) 11/02/2018  . Ecchymosis   . Tachyarrhythmia 08/30/2018  . Hypomagnesemia 08/30/2018  . Recurrent pulmonary emboli ([place])   . Hematuria   . Delirium tremens ([place])   . LFTs abnormal   . Alcoholic hepatitis without ascites   . Alcohol withdrawal ([place])  06/04/2018  . Dizziness   . Posterior tibial tendon dysfunction 02/21/2018  . Supratherapeutic INR 11/27/2016  . Dehydration 11/27/2016  . SVT (supraventricular tachycardia) ([place])   . S/P minimally-invasive mitral valve repair 10/27/2016  . History of pulmonary embolus (PE) 04/11/2016  . Left sided numbness   . Anxiety 04/02/2016  . Panic attacks 04/02/2016  . Mitral valve prolapse   . Mitral regurgitation 01/20/2016  . Atrial tachycardia ([place]) 01/06/2016  . Murmur 01/06/2016  . Mobitz I 10/09/2015  . PAC (premature atrial contraction) 10/09/2015  . Arthritis 08/14/2015  . Neuropathy due to chemotherapeutic drug ([place]) 03/09/2013  . Leucopenia 01/31/2013  . Personal history of pulmonary embolism 12/28/2012  . Alcohol abuse 08/24/2012  . Cigarette smoker 08/24/2012  . Hemorrhoid 03/08/2012  . Anemia 12/10/2011  . History of non-Hodgkin's lymphoma 09/25/2011   Past Medical History:  Diagnosis Date  . Acute pulmonary embolism ([place]) 04/11/2016  . Anxiety   . Arthritis   . Atrial tachycardia ([place]) 01/06/2016  . Depression   . DTs (delirium tremens) ([place])   . Dyspnea   . ED (erectile dysfunction)   . ETOH abuse   . Hypertension   . Lymphoma, non Hodgkin's 09/25/2011   Stage 2  . Mitral regurgitation 01/20/2016  . Mobitz I 10/09/2015  . Murmur 01/06/2016  . Occasional tremors   . PAC (premature atrial contraction) 10/09/2015  . S/P minimally-invasive mitral valve repair 10/27/2016   Complex valvuloplasty including artificial Gore-tex neochord placement x6 and 34 mm Edwards Physio II ring annuloplasty via right mini thoracotomy approach    Family History  Problem Relation Age of Onset  . Cancer Mother        BREAST(BONE)  . Cancer Father        PANCREATIC  . Hypertension Maternal Grandmother   . Stroke Maternal Aunt   . Heart attack Neg Hx     Past Surgical History:  Procedure Laterality Date  . AMPUTATION Left 01/12/2019   Procedure: LEFT GREAT TOE AMPUTATION;  Surgeon:  [name], MD;  Location: [facility];  Service: Orthopedics;  Laterality: Left;  . CARDIAC CATHETERIZATION N/A 08/13/2016   Procedure: Right/Left Heart Cath and Coronary Angiography;  Surgeon: [name], MD;  Location: [facility];  Service: Cardiovascular;  Laterality: N/A;  . MITRAL VALVE REPAIR Right 10/27/2016   Procedure: MINIMALLY INVASIVE MITRAL VALVE REPAIR (MVR) USING 34 PHYSIO II ANNULOPLASTY RING;  Surgeon: [name], MD;  Location: [facility];  Service: Open Heart Surgery;  Laterality: Right;  . SKIN  BIOPSY Right 04/04/2018   right mid medial anterior tibial shave  see report in chart  . TEE WITHOUT CARDIOVERSION N/A 02/11/2016   Procedure: TRANSESOPHAGEAL ECHOCARDIOGRAM (TEE);  Surgeon: [name], MD;  Location: [facility];  Service: Cardiovascular;  Laterality: N/A;  . TEE WITHOUT CARDIOVERSION N/A 10/27/2016   Procedure: TRANSESOPHAGEAL ECHOCARDIOGRAM (TEE);  Surgeon: [name], MD;  Location: [facility];  Service: Open Heart Surgery;  Laterality: N/A;  . TRIGGER FINGER RELEASE Bilateral    Social History   Occupational History  . Occupation: unemployed  Tobacco Use  . Smoking status: Current Every [place] Smoker    Packs/[place]: 1.00    Years: 40.00    Pack years: 40.00    Types: Cigarettes, E-cigarettes  . Smokeless tobacco: Former Systems developer    Quit date: 1985  . Tobacco comment: vapes all [place]  Substance and Sexual Activity  . Alcohol use: Yes    Alcohol/week: 0.0 standard drinks    Comment: 1-2 bottles of red wine a [place]  . Drug use: No  . Sexual activity: Not Currently

## 2019-02-07 ENCOUNTER — Telehealth (INDEPENDENT_AMBULATORY_CARE_PROVIDER_SITE_OTHER): Payer: Self-pay | Admitting: Orthopedic Surgery

## 2019-02-07 ENCOUNTER — Telehealth (INDEPENDENT_AMBULATORY_CARE_PROVIDER_SITE_OTHER): Payer: Self-pay

## 2019-02-07 ENCOUNTER — Telehealth: Payer: Self-pay | Admitting: Family Medicine

## 2019-02-07 MED ORDER — WARFARIN SODIUM 5 MG PO TABS: 5.0000 mg | ORAL_TABLET | Freq: Every day | ORAL | 1 refills | Status: DC

## 2019-02-07 NOTE — Telephone Encounter (Signed)
Have him come in for a PT/INR

## 2019-02-07 NOTE — Telephone Encounter (Signed)
Pt called for refills of coumadin. He states last time it was check was in the hospital about 2 weeks ago. ALSO pt would like to know when he should have it checked and can he do that here? Pt can be reached [phone]. Pt uses [facility] on [address]

## 2019-02-07 NOTE — Telephone Encounter (Signed)
Patient called asked if he can get his INR level checked when he come to his appointment tomorrow. The number to contact patient is [phone]

## 2019-02-07 NOTE — Telephone Encounter (Signed)
I called pt and spoke to him about his INR and informed him that he has to get his primary care physician to take care of those tests, pt understood. I also called to pre-screen for COVID-19 and stated no to all questions.

## 2019-02-07 NOTE — Telephone Encounter (Signed)
Returned call to patient left message to call back. 

## 2019-02-07 NOTE — Telephone Encounter (Signed)
Pt was called and informed about this situation.

## 2019-02-08 ENCOUNTER — Other Ambulatory Visit: Payer: Self-pay

## 2019-02-08 ENCOUNTER — Ambulatory Visit (INDEPENDENT_AMBULATORY_CARE_PROVIDER_SITE_OTHER): Payer: Self-pay | Admitting: Physician Assistant

## 2019-02-08 ENCOUNTER — Encounter (INDEPENDENT_AMBULATORY_CARE_PROVIDER_SITE_OTHER): Payer: Self-pay | Admitting: Orthopedic Surgery

## 2019-02-08 VITALS — Ht 69.0 in | Wt 187.0 lb

## 2019-02-08 DIAGNOSIS — Z89412 Acquired absence of left great toe: Secondary | ICD-10-CM

## 2019-02-08 NOTE — Progress Notes (Signed)
Office Visit Note   Patient: Leonard Bentley           Date of Birth: [birth year]           MRN: [id number] Visit Date: 02/08/2019              Requested by: [name], MD [address] [place], [place] [address] PCP: [name], MD  Chief Complaint  Patient presents with  . Left Foot - Routine Post Op    01/12/2019 left great toe amputation       HPI: The patient is a 58 yo gentleman who is seen for post operative follow up following left great toe amputation on 01/12/2019. He is walking with a cane and post op shoe on the left.  He is also concerned about a new blister over the right lateral foot.   Assessment & Plan: Visit Diagnoses:  1. Status post amputation of left great toe (HCC)     Plan: Continue to wash feet daily in Dial soap and water and apply iodorsorb ointment to the left great toe amputation site and the right lateral foot ulcer/blister .  Continue to off load the feet as much as possible and elevated as much as possible.  Continue doxycycline. Counseled and gave written instructions for vitamins, whey protein supplement and probiotics again given to the patient.  Follow up in 1 week.   Follow-Up Instructions: Return in about 1 week (around 02/15/2019).   Ortho Exam  Patient is alert, oriented, no adenopathy, well-dressed, normal affect, normal respiratory effort. The left great toe amputation site is dehisced with minimal serous appearing drainage and maceration over the site. No signs of cellulitis of the foot. Palpable pedal pulses.  Right lateral foot with small ulcer/blister over the lateral foot without signs of infection or cellulitis. Palpable pedal pulses.   Imaging: No results found.    Labs: Lab Results  Component Value Date   HGBA1C 5.2 10/25/2016   HGBA1C 6.1 (H) 04/03/2016   HGBA1C 5.9 (H) 08/18/2015   LABURIC 5.2 12/09/2018   LABURIC 7.4 08/18/2015   LABURIC 4.1 12/01/2011   REPTSTATUS 01/14/2019 FINAL 01/09/2019   CULT   01/09/2019    NO GROWTH 5 DAYS Performed at [facility], [place] [address] [place], [place] [address]      Lab Results  Component Value Date   ALBUMIN 3.2 (L) 01/10/2019   ALBUMIN 3.4 (L) 12/28/2018   ALBUMIN 4.0 12/27/2018   LABURIC 5.2 12/09/2018   LABURIC 7.4 08/18/2015   LABURIC 4.1 12/01/2011    Body mass index is 27.62 kg/m.  Orders:  No orders of the defined types were placed in this encounter.  No orders of the defined types were placed in this encounter.    Procedures: No procedures performed  Clinical Data: No additional findings.  ROS:  All other systems negative, except as noted in the HPI. Review of Systems  Objective: Vital Signs: Ht 5\' 9"  (1.753 m)   Wt 187 lb (84.8 kg)   BMI 27.62 kg/m   Specialty Comments:  No specialty comments available.  PMFS History: Patient Active Problem List   Diagnosis Date Noted  . Osteomyelitis of great toe of left foot ([place]) 01/09/2019  . Foot ulcer, left ([place]) 12/27/2018  . Cellulitis 12/07/2018  . Bleeding from left ear 11/28/2018  . HTN (hypertension) 11/02/2018  . UTI (urinary tract infection) 11/02/2018  . Ecchymosis   . Tachyarrhythmia 08/30/2018  . Hypomagnesemia 08/30/2018  .  Recurrent pulmonary emboli ([place])   . Hematuria   . Delirium tremens ([place])   . LFTs abnormal   . Alcoholic hepatitis without ascites   . Alcohol withdrawal ([place]) 06/04/2018  . Dizziness   . Posterior tibial tendon dysfunction 02/21/2018  . Supratherapeutic INR 11/27/2016  . Dehydration 11/27/2016  . SVT (supraventricular tachycardia) ([place])   . S/P minimally-invasive mitral valve repair 10/27/2016  . History of pulmonary embolus (PE) 04/11/2016  . Left sided numbness   . Anxiety 04/02/2016  . Panic attacks 04/02/2016  . Mitral valve prolapse   . Mitral regurgitation 01/20/2016  . Atrial tachycardia ([place]) 01/06/2016  . Murmur 01/06/2016  . Mobitz I 10/09/2015  . PAC (premature atrial contraction) 10/09/2015  .  Arthritis 08/14/2015  . Neuropathy due to chemotherapeutic drug ([place]) 03/09/2013  . Leucopenia 01/31/2013  . Personal history of pulmonary embolism 12/28/2012  . Alcohol abuse 08/24/2012  . Cigarette smoker 08/24/2012  . Hemorrhoid 03/08/2012  . Anemia 12/10/2011  . History of non-Hodgkin's lymphoma 09/25/2011   Past Medical History:  Diagnosis Date  . Acute pulmonary embolism ([place]) 04/11/2016  . Anxiety   . Arthritis   . Atrial tachycardia ([place]) 01/06/2016  . Depression   . DTs (delirium tremens) ([place])   . Dyspnea   . ED (erectile dysfunction)   . ETOH abuse   . Hypertension   . Lymphoma, non Hodgkin's 09/25/2011   Stage 2  . Mitral regurgitation 01/20/2016  . Mobitz I 10/09/2015  . Murmur 01/06/2016  . Occasional tremors   . PAC (premature atrial contraction) 10/09/2015  . S/P minimally-invasive mitral valve repair 10/27/2016   Complex valvuloplasty including artificial Gore-tex neochord placement x6 and 34 mm Edwards Physio II ring annuloplasty via right mini thoracotomy approach    Family History  Problem Relation Age of Onset  . Cancer Mother        BREAST(BONE)  . Cancer Father        PANCREATIC  . Hypertension Maternal Grandmother   . Stroke Maternal Aunt   . Heart attack Neg Hx     Past Surgical History:  Procedure Laterality Date  . AMPUTATION Left 01/12/2019   Procedure: LEFT GREAT TOE AMPUTATION;  Surgeon: [name], MD;  Location: [facility];  Service: Orthopedics;  Laterality: Left;  . CARDIAC CATHETERIZATION N/A 08/13/2016   Procedure: Right/Left Heart Cath and Coronary Angiography;  Surgeon: [name], MD;  Location: [facility];  Service: Cardiovascular;  Laterality: N/A;  . MITRAL VALVE REPAIR Right 10/27/2016   Procedure: MINIMALLY INVASIVE MITRAL VALVE REPAIR (MVR) USING 34 PHYSIO II ANNULOPLASTY RING;  Surgeon: [name], MD;  Location: [facility];  Service: Open Heart Surgery;  Laterality: Right;  . SKIN BIOPSY Right 04/04/2018   right  mid medial anterior tibial shave  see report in chart  . TEE WITHOUT CARDIOVERSION N/A 02/11/2016   Procedure: TRANSESOPHAGEAL ECHOCARDIOGRAM (TEE);  Surgeon: [name], MD;  Location: [facility];  Service: Cardiovascular;  Laterality: N/A;  . TEE WITHOUT CARDIOVERSION N/A 10/27/2016   Procedure: TRANSESOPHAGEAL ECHOCARDIOGRAM (TEE);  Surgeon: [name], MD;  Location: [facility];  Service: Open Heart Surgery;  Laterality: N/A;  . TRIGGER FINGER RELEASE Bilateral    Social History   Occupational History  . Occupation: unemployed  Tobacco Use  . Smoking status: Current Every [place] Smoker    Packs/[place]: 1.00    Years: 40.00    Pack years: 40.00    Types: Cigarettes, E-cigarettes  . Smokeless  tobacco: Former Systems developer    Quit date: 1985  . Tobacco comment: vapes all [place]  Substance and Sexual Activity  . Alcohol use: Yes    Alcohol/week: 0.0 standard drinks    Comment: 1-2 bottles of red wine a [place]  . Drug use: No  . Sexual activity: Not Currently

## 2019-02-08 NOTE — Telephone Encounter (Signed)
Pt going to [facility] wellness tomorrow and will have PT/INR done there and will get refills sent In from there since he does not have insurance

## 2019-02-09 ENCOUNTER — Other Ambulatory Visit: Payer: Self-pay

## 2019-02-09 ENCOUNTER — Telehealth (INDEPENDENT_AMBULATORY_CARE_PROVIDER_SITE_OTHER): Payer: Self-pay

## 2019-02-09 ENCOUNTER — Observation Stay (HOSPITAL_COMMUNITY)
Admission: EM | Admit: 2019-02-09 | Discharge: 2019-02-10 | Disposition: A | Discharge status: Home / Self Care | Payer: Self-pay | Attending: Internal Medicine | Admitting: Internal Medicine

## 2019-02-09 ENCOUNTER — Ambulatory Visit: Payer: Self-pay | Attending: Family Medicine | Admitting: Pharmacist

## 2019-02-09 DIAGNOSIS — I2699 Other pulmonary embolism without acute cor pulmonale: Secondary | ICD-10-CM

## 2019-02-09 DIAGNOSIS — I471 Supraventricular tachycardia, unspecified: Secondary | ICD-10-CM | POA: Diagnosis present

## 2019-02-09 DIAGNOSIS — H6123 Impacted cerumen, bilateral: Secondary | ICD-10-CM | POA: Diagnosis present

## 2019-02-09 DIAGNOSIS — Z86711 Personal history of pulmonary embolism: Secondary | ICD-10-CM

## 2019-02-09 DIAGNOSIS — Z9889 Other specified postprocedural states: Secondary | ICD-10-CM

## 2019-02-09 DIAGNOSIS — R791 Abnormal coagulation profile: Secondary | ICD-10-CM | POA: Diagnosis present

## 2019-02-09 DIAGNOSIS — R000 Tachycardia, unspecified: Secondary | ICD-10-CM

## 2019-02-09 DIAGNOSIS — F10939 Alcohol use, unspecified with withdrawal, unspecified: Secondary | ICD-10-CM | POA: Diagnosis present

## 2019-02-09 DIAGNOSIS — Z7901 Long term (current) use of anticoagulants: Secondary | ICD-10-CM | POA: Insufficient documentation

## 2019-02-09 DIAGNOSIS — I10 Essential (primary) hypertension: Secondary | ICD-10-CM | POA: Insufficient documentation

## 2019-02-09 DIAGNOSIS — F1010 Alcohol abuse, uncomplicated: Secondary | ICD-10-CM | POA: Diagnosis present

## 2019-02-09 DIAGNOSIS — Z79899 Other long term (current) drug therapy: Secondary | ICD-10-CM | POA: Insufficient documentation

## 2019-02-09 DIAGNOSIS — F17210 Nicotine dependence, cigarettes, uncomplicated: Secondary | ICD-10-CM | POA: Insufficient documentation

## 2019-02-09 DIAGNOSIS — F10239 Alcohol dependence with withdrawal, unspecified: Secondary | ICD-10-CM | POA: Insufficient documentation

## 2019-02-09 LAB — COMPREHENSIVE METABOLIC PANEL
ALT: 18 U/L (ref 0–44)
AST: 28 U/L (ref 15–41)
Albumin: 3.7 g/dL (ref 3.5–5.0)
Alkaline Phosphatase: 53 U/L (ref 38–126)
Anion gap: 8 (ref 5–15)
BUN: 10 mg/dL (ref 6–20)
CO2: 23 mmol/L (ref 22–32)
Calcium: 9.2 mg/dL (ref 8.9–10.3)
Chloride: 105 mmol/L (ref 98–111)
Creatinine, Ser: 0.86 mg/dL (ref 0.61–1.24)
GFR calc Af Amer: >60 mL/min (ref >60)
GFR calc non Af Amer: >60 mL/min (ref >60)
Glucose, Bld: 106 mg/dL — ABNORMAL HIGH (ref 70–99)
Potassium: 4.5 mmol/L (ref 3.5–5.1)
Sodium: 136 mmol/L (ref 135–145)
Total Bilirubin: 0.3 mg/dL (ref 0.3–1.2)
Total Protein: 6.9 g/dL (ref 6.5–8.1)

## 2019-02-09 LAB — CBC
HCT: 39.2 % (ref 39.0–52.0)
Hemoglobin: 12.6 g/dL — ABNORMAL LOW (ref 13.0–17.0)
MCH: 24.9 pg — ABNORMAL LOW (ref 26.0–34.0)
MCHC: 32.1 g/dL (ref 30.0–36.0)
MCV: 77.3 fL — ABNORMAL LOW (ref 80.0–100.0)
Platelets: 229 10*3/uL (ref 150–400)
RBC: 5.07 MIL/uL (ref 4.22–5.81)
RDW: 14.9 % (ref 11.5–15.5)
WBC: 3.4 10*3/uL — ABNORMAL LOW (ref 4.0–10.5)
nRBC: 0.9 % — ABNORMAL HIGH (ref 0.0–0.2)

## 2019-02-09 LAB — PROTIME-INR
INR: >10 (ref 0.8–1.2)
Prothrombin Time: >90 seconds — ABNORMAL HIGH (ref 11.4–15.2)
Prothrombin Time: >120 s — ABNORMAL HIGH (ref 9.1–12.0)

## 2019-02-09 LAB — TSH: TSH: 0.619 u[IU]/mL (ref 0.350–4.500)

## 2019-02-09 LAB — POCT INR: INR: 8 — AB (ref 2.0–3.0)

## 2019-02-09 MED ORDER — THIAMINE HCL 100 MG/ML IJ SOLN
Freq: Once | INTRAVENOUS | Status: AC
  Administered 2019-02-09: 19:00:00 via INTRAVENOUS
  Filled 2019-02-09: qty 1000

## 2019-02-09 MED ORDER — SODIUM CHLORIDE 0.9% FLUSH: 3.0000 mL | Freq: Two times a day (BID) | INTRAVENOUS | Status: DC

## 2019-02-09 MED ORDER — PHYTONADIONE 5 MG PO TABS
2.5000 mg | ORAL_TABLET | Freq: Once | ORAL | Status: AC
  Administered 2019-02-09: 2.5 mg via ORAL
  Filled 2019-02-09: qty 1

## 2019-02-09 MED ORDER — ADULT MULTIVITAMIN W/MINERALS CH: 1.0000 | ORAL_TABLET | Freq: Every day | ORAL | Status: DC

## 2019-02-09 MED ORDER — CARBAMIDE PEROXIDE 6.5 % OT SOLN
5.0000 [drp] | Freq: Two times a day (BID) | OTIC | Status: DC
  Administered 2019-02-09 – 2019-02-10 (×2): 5 [drp] via OTIC
  Filled 2019-02-09: qty 15

## 2019-02-09 MED ORDER — ACETAMINOPHEN 500 MG PO TABS
1000.0000 mg | ORAL_TABLET | Freq: Once | ORAL | Status: AC
  Administered 2019-02-09: 1000 mg via ORAL
  Filled 2019-02-09: qty 2

## 2019-02-09 MED ORDER — SODIUM CHLORIDE 0.9 % IV BOLUS
1000.0000 mL | Freq: Once | INTRAVENOUS | Status: AC
  Administered 2019-02-09: 1000 mL via INTRAVENOUS

## 2019-02-09 MED ORDER — LORAZEPAM 1 MG PO TABS
1.0000 mg | ORAL_TABLET | Freq: Four times a day (QID) | ORAL | Status: DC | PRN
  Administered 2019-02-10: 1 mg via ORAL
  Filled 2019-02-09: qty 1

## 2019-02-09 MED ORDER — THIAMINE HCL 100 MG/ML IJ SOLN: 100.0000 mg | Freq: Every day | INTRAMUSCULAR | Status: DC

## 2019-02-09 MED ORDER — ADULT MULTIVITAMIN W/MINERALS CH
1.0000 | ORAL_TABLET | Freq: Every day | ORAL | Status: DC
  Administered 2019-02-10: 1 via ORAL
  Filled 2019-02-09: qty 1

## 2019-02-09 MED ORDER — LORAZEPAM 2 MG/ML IJ SOLN: 0.0000 mg | Freq: Two times a day (BID) | INTRAMUSCULAR | Status: DC

## 2019-02-09 MED ORDER — LORAZEPAM 2 MG/ML IJ SOLN
0.0000 mg | Freq: Four times a day (QID) | INTRAMUSCULAR | Status: DC
  Administered 2019-02-10: 1 mg via INTRAVENOUS
  Filled 2019-02-09: qty 1

## 2019-02-09 MED ORDER — LORAZEPAM 1 MG PO TABS
1.0000 mg | ORAL_TABLET | Freq: Once | ORAL | Status: AC
  Administered 2019-02-09: 1 mg via ORAL
  Filled 2019-02-09: qty 1

## 2019-02-09 MED ORDER — LORAZEPAM 2 MG/ML IJ SOLN
1.0000 mg | Freq: Four times a day (QID) | INTRAMUSCULAR | Status: DC | PRN
  Administered 2019-02-10: 1 mg via INTRAVENOUS
  Filled 2019-02-09: qty 1

## 2019-02-09 MED ORDER — FOLIC ACID 1 MG PO TABS: 1.0000 mg | ORAL_TABLET | Freq: Every day | ORAL | Status: DC

## 2019-02-09 MED ORDER — VITAMIN B-1 100 MG PO TABS
100.0000 mg | ORAL_TABLET | Freq: Every day | ORAL | Status: DC
  Administered 2019-02-10: 100 mg via ORAL
  Filled 2019-02-09: qty 1

## 2019-02-09 MED ORDER — NICOTINE 21 MG/24HR TD PT24
21.0000 mg | MEDICATED_PATCH | Freq: Every day | TRANSDERMAL | Status: DC
  Administered 2019-02-09 – 2019-02-10 (×2): 21 mg via TRANSDERMAL
  Filled 2019-02-09 (×2): qty 1

## 2019-02-09 MED ORDER — OXYCODONE HCL 5 MG PO TABS
5.0000 mg | ORAL_TABLET | Freq: Once | ORAL | Status: AC
  Administered 2019-02-09: 5 mg via ORAL
  Filled 2019-02-09: qty 1

## 2019-02-09 MED ORDER — POTASSIUM CHLORIDE IN NACL 20-0.9 MEQ/L-% IV SOLN
INTRAVENOUS | Status: DC
  Administered 2019-02-10: 03:00:00 via INTRAVENOUS
  Filled 2019-02-09 (×2): qty 1000

## 2019-02-09 MED ORDER — FOLIC ACID 1 MG PO TABS
1.0000 mg | ORAL_TABLET | Freq: Every day | ORAL | Status: DC
  Administered 2019-02-10: 1 mg via ORAL
  Filled 2019-02-09: qty 1

## 2019-02-09 MED ORDER — VITAMIN B-1 100 MG PO TABS: 100.0000 mg | ORAL_TABLET | Freq: Every day | ORAL | Status: DC

## 2019-02-09 NOTE — ED Provider Notes (Signed)
Medical screening examination/treatment/procedure(s) were conducted as a shared visit with non-physician practitioner(s) and myself.  I personally evaluated the patient during the encounter.  EKG Interpretation  Date/Time:  Friday February 09 2019 12:16:59 EDT Ventricular Rate:  133 PR Interval:    QRS Duration: 82 QT Interval:  323 QTC Calculation: 481 R Axis:   -55 Text Interpretation:  Sinus tachycardia Left anterior fascicular block Abnormal R-wave progression, late transition Borderline prolonged QT interval tachycardia, no sig change. Confirmed by Charlesetta Shanks [phone]) on 02/09/2019 12:23:05 PM Patient with history of anticoagulation due to recurrent pulmonary embolus.  Evaluation today showed INR to be significantly elevated.  Patient referred to the emergency department for management.  Patient denies chest pain or shortness of breath.  He has not had fever.  Patient does have history of alcohol use.  He reports last alcohol consumption last night.  Chart review shows history of DTs.  Patient reports also he has been taking Xanax but reports he has been out of it for several months.  He denies he has had any Xanax for several months and denies that he gets it from illicit sources.  He denies IV drug abuse.  Patient is alert and appropriate with clear mental status.  Heart tachycardic 130s no gross rub or gallop.  Lungs grossly clear.  Abdomen soft and nontender.  Patient's left lower extremity is wrapped in a dressing and Ace wrap.  Patient is slightly tremulous in the hands.  His mental status is clear.  He shows no signs of active hallucinating.  Patient presents with supratherapeutic INR.  This time no sign of active bleeding.  Given Vit  K.  Patient is also is tachycardic to the 130s.  He is slightly tremulous.  Consideration is for alcohol withdrawal.  Lower suspicion for infectious etiology at this time.  Patient is actively being treated for osteomyelitis but is afebrile and wound  appears stable.  I agree with plan of management.   Charlesetta Shanks, MD 02/09/19 1630

## 2019-02-09 NOTE — Telephone Encounter (Signed)
Pt was called and informed that he should call his pharmacy because he has one refill left. He understood

## 2019-02-09 NOTE — ED Triage Notes (Signed)
[facility] level. Pt reports that he does take coumadin and has been taking it as prescribed. Pt reports that he hasn't had his INR level checked in 3 weeks. Pt reporting pain in his left foot where he had a toe amputated about three weeks ago but denies any other complaints.

## 2019-02-09 NOTE — ED Provider Notes (Signed)
[facility] EMERGENCY DEPARTMENT Provider Note   CSN: [id number] Arrival date & time: 02/09/19  1210    History   Chief Complaint Chief Complaint  Patient presents with   Abnormal Lab    HPI Leonard Bentley is a 58 y.o. male.     Leonard Bentley is a 58 y.o. male with a history of hypertension, mitral valve repair, PE, atrial tachycardia, alcohol abuse and anxiety, who presents to the emergency department from the [facility] clinic for evaluation of elevated INR.  Patient has been on warfarin chronically due to mitral valve repair and PE, reports he had not had his INR checked in over 3 weeks, today it came back at 8, he reports that he currently takes 5 mg of warfarin daily.  Not had any recent dietary changes or medication changes to contribute to this.  He has not had any abnormal bleeding, no recent trauma or injury is not having any abdominal pain, back pain, chest pain or shortness of breath.  No headache, vision changes, numbness or weakness, no recent head injury.  No bleeding from gums.  No swollen or painful joints.  Patient is noted to be tachycardic on arrival, reports that this typically happens when he goes to the doctor's office or hospital as he is currently feeling very anxious.  Patient has history of alcohol abuse, reports yesterday drink an entire bottle of wine has not had any alcohol today.  No other aggravating or alleviating factors.  He has not had any fevers or recent infectious symptoms, no cough or nasal congestion.  He recently had first and second toe amputations with Dr. [name] and has been following up appropriately for monitoring of this surgical site as it heals, had appointment yesterday, and was prescribed ointment, but overall seems to be healing well.  Reports some pain at the site and has not taken his home pain medication yet today.  Additional history obtained by reviewing notes from community health and wellness  anticoagulation clinic visit today, with elevated INR, had initially plan to give patient p.o. vitamin K but they did not have any available in the office so sent to the emergency department for further evaluation.  Patient has follow-up INR check scheduled for first thing Monday morning.     Past Medical History:  Diagnosis Date   Acute pulmonary embolism ([place]) 04/11/2016   Anxiety    Arthritis    Atrial tachycardia ([place]) 01/06/2016   Depression    DTs (delirium tremens) ([place])    Dyspnea    ED (erectile dysfunction)    ETOH abuse    Hypertension    Lymphoma, non Hodgkin's 09/25/2011   Stage 2   Mitral regurgitation 01/20/2016   Mobitz I 10/09/2015   Murmur 01/06/2016   Occasional tremors    PAC (premature atrial contraction) 10/09/2015   S/P minimally-invasive mitral valve repair 10/27/2016   Complex valvuloplasty including artificial Gore-tex neochord placement x6 and 34 mm Edwards Physio II ring annuloplasty via right mini thoracotomy approach    Patient Active Problem List   Diagnosis Date Noted   Osteomyelitis of great toe of left foot ([place]) 01/09/2019   Foot ulcer, left ([place]) 12/27/2018   Cellulitis 12/07/2018   Bleeding from left ear 11/28/2018   HTN (hypertension) 11/02/2018   UTI (urinary tract infection) 11/02/2018   Ecchymosis    Tachyarrhythmia 08/30/2018   Hypomagnesemia 08/30/2018   Recurrent pulmonary emboli (H[facility])    Hematuria    Delirium  tremens ([place])    LFTs abnormal    Alcoholic hepatitis without ascites    Alcohol withdrawal ([place]) 06/04/2018   Dizziness    Posterior tibial tendon dysfunction 02/21/2018   Supratherapeutic INR 11/27/2016   Dehydration 11/27/2016   SVT (supraventricular tachycardia) (HCC)    S/P minimally-invasive mitral valve repair 10/27/2016   History of pulmonary embolus (PE) 04/11/2016   Left sided numbness    Anxiety 04/02/2016   Panic attacks 04/02/2016   Mitral valve prolapse     Mitral regurgitation 01/20/2016   Atrial tachycardia ([place]) 01/06/2016   Murmur 01/06/2016   Mobitz I 10/09/2015   PAC (premature atrial contraction) 10/09/2015   Arthritis 08/14/2015   Neuropathy due to chemotherapeutic drug ([place]) 03/09/2013   Leucopenia 01/31/2013   Personal history of pulmonary embolism 12/28/2012   Alcohol abuse 08/24/2012   Cigarette smoker 08/24/2012   Hemorrhoid 03/08/2012   Anemia 12/10/2011   History of non-Hodgkin's lymphoma 09/25/2011    Past Surgical History:  Procedure Laterality Date   AMPUTATION Left 01/12/2019   Procedure: LEFT GREAT TOE AMPUTATION;  Surgeon: [name], MD;  Location: [facility];  Service: Orthopedics;  Laterality: Left;   CARDIAC CATHETERIZATION N/A 08/13/2016   Procedure: Right/Left Heart Cath and Coronary Angiography;  Surgeon: [name], MD;  Location: [facility];  Service: Cardiovascular;  Laterality: N/A;   MITRAL VALVE REPAIR Right 10/27/2016   Procedure: MINIMALLY INVASIVE MITRAL VALVE REPAIR (MVR) USING 34 PHYSIO II ANNULOPLASTY RING;  Surgeon: [name], MD;  Location: [facility];  Service: Open Heart Surgery;  Laterality: Right;   SKIN BIOPSY Right 04/04/2018   right mid medial anterior tibial shave  see report in chart   TEE WITHOUT CARDIOVERSION N/A 02/11/2016   Procedure: TRANSESOPHAGEAL ECHOCARDIOGRAM (TEE);  Surgeon: [name], MD;  Location: [facility];  Service: Cardiovascular;  Laterality: N/A;   TEE WITHOUT CARDIOVERSION N/A 10/27/2016   Procedure: TRANSESOPHAGEAL ECHOCARDIOGRAM (TEE);  Surgeon: [name], MD;  Location: [facility];  Service: Open Heart Surgery;  Laterality: N/A;   TRIGGER FINGER RELEASE Bilateral         Home Medications    Prior to Admission medications   Medication Sig Start Date End Date Taking? Authorizing Provider  acetaminophen (TYLENOL) 650 MG CR tablet Take 650 mg by mouth every 8 (eight) hours as needed for pain.    [provider]  chlordiazePOXIDE (LIBRIUM) 5 MG capsule Take 1 capsule (5 mg total) by mouth 3 (three) times daily as needed for anxiety. 12/31/18   Caren Griffins, MD  doxycycline (VIBRAMYCIN) 100 MG capsule Take 1 capsule (100 mg total) by mouth 2 (two) times daily. 02/01/19   Rayburn, Neta Mends, PA-C  gabapentin (NEURONTIN) 100 MG capsule Take 1 capsule (100 mg total) by mouth 3 (three) times daily. Patient taking differently: Take 100 mg by mouth 2 (two) times daily.  12/31/18 12/31/19  Caren Griffins, MD  oxyCODONE (OXY IR/ROXICODONE) 5 MG immediate release tablet Take 1 tablet (5 mg total) by mouth every 4 (four) hours as needed for severe pain. 02/01/19   Rayburn, Neta Mends, PA-C  polyethylene glycol (MIRALAX / GLYCOLAX) packet Take 17 g by mouth daily as needed for mild constipation. 01/21/19   Charlynne Cousins, MD  warfarin (COUMADIN) 5 MG tablet Take 1 tablet (5 mg total) by mouth daily. 02/07/19   [name], MD    Family History Family History  Problem Relation Age of Onset  Cancer Mother        BREAST(BONE)   Cancer Father        PANCREATIC   Hypertension Maternal Grandmother    Stroke Maternal Aunt    Heart attack Neg Hx     Social History Social History   Tobacco Use   Smoking status: Current Every Franzel Smoker    Packs/Dolinski: 1.00    Years: 40.00    Pack years: 40.00    Types: Cigarettes, E-cigarettes   Smokeless tobacco: Former Systems developer    Quit date: 1985   Tobacco comment: vapes all [place]  Substance Use Topics   Alcohol use: Yes    Alcohol/week: 0.0 standard drinks    Comment: 1-2 bottles of red wine a [place]   Drug use: No     Allergies   Other   Review of Systems Review of Systems  Constitutional: Negative for chills and fever.  HENT: Negative.   Respiratory: Negative for cough and shortness of breath.   Cardiovascular: Negative for chest pain.  Gastrointestinal: Negative for abdominal pain, nausea and vomiting.  Genitourinary: Negative  for dysuria, flank pain and frequency.  Musculoskeletal: Positive for arthralgias. Negative for back pain, joint swelling and myalgias.  Skin: Negative for color change and rash.  Neurological: Negative for dizziness, syncope, light-headedness and headaches.  Hematological: Does not bruise/bleed easily.     Physical Exam Updated Vital Signs BP 120/89    Pulse (!) 132    Temp 98.1 F (36.7 C) (Oral)    Resp 19    Ht 5\' 9"  (1.753 m)    Wt 84.8 kg    SpO2 99%    BMI 27.62 kg/m   Physical Exam Vitals signs and nursing note reviewed.  Constitutional:      General: He is not in acute distress.    Appearance: Normal appearance. He is well-developed and normal weight. He is not ill-appearing or diaphoretic.  HENT:     Head: Normocephalic and atraumatic.     Nose: Nose normal.     Mouth/Throat:     Mouth: Mucous membranes are moist.     Pharynx: Oropharynx is clear.     Comments: There is a small hematoma over the right buccal mucosa where patient bit his lip and had a small amount of bleeding.  No surrounding signs of infection, no purulent drainage. Eyes:     General:        Right eye: No discharge.        Left eye: No discharge.     Pupils: Pupils are equal, round, and reactive to light.  Neck:     Musculoskeletal: Neck supple.  Cardiovascular:     Rate and Rhythm: Regular rhythm. Tachycardia present.     Heart sounds: Normal heart sounds. No murmur. No friction rub. No gallop.      Comments: Tachycardia with regular rhythm Pulmonary:     Effort: Pulmonary effort is normal. No respiratory distress.     Breath sounds: Normal breath sounds. No wheezing or rales.     Comments: Respirations equal and unlabored, patient able to speak in full sentences, lungs clear to auscultation bilaterally Abdominal:     General: Abdomen is flat. Bowel sounds are normal. There is no distension.     Palpations: Abdomen is soft. There is no mass.     Tenderness: There is no abdominal tenderness.  There is no guarding.     Comments: Abdomen soft, nondistended, nontender to palpation in all quadrants without guarding  or peritoneal signs  Musculoskeletal:     Comments: Left foot with amputation of first and second toe, wound is healing well, consistent with recent orthopedic follow-up visit, see photo below.  No purulent drainage, erythema or signs of cellulitis or infection. All joints are supple and easily movable without swelling, no signs of hemarthrosis.  Skin:    General: Skin is warm and dry.     Capillary Refill: Capillary refill takes less than 2 seconds.  Neurological:     Mental Status: He is alert and oriented to person, place, and time.     Coordination: Coordination normal.     Comments: Speech is clear, able to follow commands CN III-XII intact Normal strength in upper and lower extremities bilaterally including dorsiflexion and plantar flexion, strong and equal grip strength Sensation normal to light and sharp touch Moves extremities without ataxia, coordination intact   Psychiatric:        Mood and Affect: Mood normal.        Behavior: Behavior normal.        ED Treatments / Results  Labs (all labs ordered are listed, but only abnormal results are displayed) Labs Reviewed  COMPREHENSIVE METABOLIC PANEL - Abnormal; Notable for the following components:      Result Value   Glucose, Bld 106 (*)    All other components within normal limits  CBC - Abnormal; Notable for the following components:   WBC 3.4 (*)    Hemoglobin 12.6 (*)    MCV 77.3 (*)    MCH 24.9 (*)    nRBC 0.9 (*)    All other components within normal limits  PROTIME-INR - Abnormal; Notable for the following components:   Prothrombin Time >90.0 (*)    INR >10.0 (*)    All other components within normal limits    EKG EKG Interpretation  Date/Time:  Friday February 09 2019 12:16:59 EDT Ventricular Rate:  133 PR Interval:    QRS Duration: 82 QT Interval:  323 QTC Calculation: 481 R  Axis:   -55 Text Interpretation:  Sinus tachycardia Left anterior fascicular block Abnormal R-wave progression, late transition Borderline prolonged QT interval tachycardia, no sig change. Confirmed by Charlesetta Shanks [phone]) on 02/09/2019 12:23:05 PM   Radiology No results found.  Procedures Procedures (including critical care time)  Medications Ordered in ED Medications  LORazepam (ATIVAN) tablet 1 mg (has no administration in time range)  phytonadione (VITAMIN K) tablet 2.5 mg (2.5 mg Oral Given 02/09/19 1514)  oxyCODONE (Oxy IR/ROXICODONE) immediate release tablet 5 mg (5 mg Oral Given 02/09/19 1514)  acetaminophen (TYLENOL) tablet 1,000 mg (1,000 mg Oral Given 02/09/19 1514)     Initial Impression / Assessment and Plan / ED Course  I have reviewed the triage vital signs and the nursing notes.  Pertinent labs & imaging results that were available during my care of the patient were reviewed by me and considered in my medical decision making (see chart for details).  Patient presents from [facility] for elevated INR, confirmed here to be greater than 10 he is not having any focal bleeding symptoms or pain.Patient noted to be persistently tachycardic, afebrile and all other vitals normal, no infectious symptoms.  Initially patient noted that he always gets extremely anxious when coming to the hospital and thought that may be why his heart rate has elevated but he is remained persistently tachycardic during his stay.  He does not have any sources of infection, labs overall reassuring with no  leukocytosis and stable hemoglobin.  Normal renal and liver function.  INR greater than 10 but no evidence of bleeding.  Patient had recent amputation of toes but wound is well-appearing with no signs of infection.  Suspect he may have some degree of tachycardia related to pain, pain medication ordered here in the ED as he has been on this regularly at home for his healing amputation wound.   Patient also has known history of anxiety and typically takes benzos but has ran out of these, and he also intermittently uses alcohol and last drink yesterday and may have some mild withdrawal.  He has known history of atrial tachycardia and SVT, EKG here shows sinus tachycardia with borderline QT prolongation.  Labs overall reassuring.  Discussed elevated INR with pharmacy and they recommend giving 2.5 mg of p.o. potassium and rechecking INR, holding warfarin at this time.  Given persistent tachycardia will give dose of IV Ativan and fluid bolus as I suspect patient may be having some mild alcohol withdrawal, I have also added a TSH onto labs, will admit to medicine for observation given significantly elevated INR and persistent tachycardia for further evaluation.  Case discussed with Dr. Evangeline Gula with [facility] hospitalist who will see and admit the patient.  Discussed with Dr. Vallery Ridge who saw and evaluated patient as well and is in agreement with plan.  Final Clinical Impressions(s) / ED Diagnoses   Final diagnoses:  Supratherapeutic INR  Sinus tachycardia    ED Discharge Orders    None       Jacqlyn Larsen, [place] 02/11/19 [id number]    Charlesetta Shanks, MD 02/11/19 1130

## 2019-02-09 NOTE — H&P (Addendum)
History and Physical    Leonard Bentley BTD:[id number] DOB: 04-Dec-1960 DOA: 02/09/2019  PCP: [facility] Patient coming from: [facility]  I have personally briefly reviewed patient's old medical records in [place]  Chief Complaint: Abnormal blood work  HPI: Leonard Bentley is a 58 y.o. male with medical history significant of current pulmonary embolism on warfarin, hypertension, alcoholism last drank a bottle of wine last night, tobacco abuse, peripheral vascular disease, non-Hodgkin's lymphoma and chronic tachycardia who was seen at community clinic today to have his INR checked.  It came back greater than 10 and he was referred here for evaluation.  Upon arrival the patient was noted to be in SVT with a heart rate of 134.  In the emergency department he received vitamin K 2.5 mg 1 mg of oral lorazepam (this was given at 4 PM and his heart rate came down to 92 at 5:15 p.m).  Referred to me for further evaluation and management of minor alcohol withdrawal and elevated INR. Patient denies any chest pain, shortness of breath, headache, nausea, vomiting, rash, fever, chills, cough, sputum production   Review of Systems: As per HPI otherwise all other systems reviewed and  negative.  Past Medical History:  Diagnosis Date  . Acute pulmonary embolism ([place]) 04/11/2016  . Anxiety   . Arthritis   . Atrial tachycardia ([place]) 01/06/2016  . Depression   . DTs (delirium tremens) ([place])   . Dyspnea   . ED (erectile dysfunction)   . ETOH abuse   . Hypertension   . Lymphoma, non Hodgkin's 09/25/2011   Stage 2  . Mitral regurgitation 01/20/2016  . Mobitz I 10/09/2015  . Murmur 01/06/2016  . Occasional tremors   . PAC (premature atrial contraction) 10/09/2015  . S/P minimally-invasive mitral valve repair 10/27/2016   Complex valvuloplasty including artificial Gore-tex neochord placement x6 and 34 mm Edwards Physio II ring annuloplasty via right mini thoracotomy approach    Past Surgical  History:  Procedure Laterality Date  . AMPUTATION Left 01/12/2019   Procedure: LEFT GREAT TOE AMPUTATION;  Surgeon: [name], MD;  Location: [facility];  Service: Orthopedics;  Laterality: Left;  . CARDIAC CATHETERIZATION N/A 08/13/2016   Procedure: Right/Left Heart Cath and Coronary Angiography;  Surgeon: [name], MD;  Location: [facility];  Service: Cardiovascular;  Laterality: N/A;  . MITRAL VALVE REPAIR Right 10/27/2016   Procedure: MINIMALLY INVASIVE MITRAL VALVE REPAIR (MVR) USING 34 PHYSIO II ANNULOPLASTY RING;  Surgeon: [name], MD;  Location: [facility];  Service: Open Heart Surgery;  Laterality: Right;  . SKIN BIOPSY Right 04/04/2018   right mid medial anterior tibial shave  see report in chart  . TEE WITHOUT CARDIOVERSION N/A 02/11/2016   Procedure: TRANSESOPHAGEAL ECHOCARDIOGRAM (TEE);  Surgeon: [name], MD;  Location: [facility];  Service: Cardiovascular;  Laterality: N/A;  . TEE WITHOUT CARDIOVERSION N/A 10/27/2016   Procedure: TRANSESOPHAGEAL ECHOCARDIOGRAM (TEE);  Surgeon: [name], MD;  Location: [facility];  Service: Open Heart Surgery;  Laterality: N/A;  . TRIGGER FINGER RELEASE Bilateral     Social History   Social History Narrative   Epworth sleepiness scale as of 10/09/15 a 1     reports that he has been smoking cigarettes and e-cigarettes. He has a 40.00 pack-year smoking history. He quit smokeless tobacco use about 35 years ago. He reports current alcohol use. He reports that he does not use drugs.  Allergies  Allergen Reactions  . Other  Other (See Comments)    "Cactus" blisters on tongue    Family History  Problem Relation Age of Onset  . Cancer Mother        BREAST(BONE)  . Cancer Father        PANCREATIC  . Hypertension Maternal Grandmother   . Stroke Maternal Aunt   . Heart attack Neg Hx     Prior to Admission medications   Medication Sig Start Date End Date Taking? Authorizing Provider  acetaminophen (TYLENOL) 650  MG CR tablet Take 650 mg by mouth every 8 (eight) hours as needed for pain.    [provider]  chlordiazePOXIDE (LIBRIUM) 5 MG capsule Take 1 capsule (5 mg total) by mouth 3 (three) times daily as needed for anxiety. 12/31/18   Caren Griffins, MD  doxycycline (VIBRAMYCIN) 100 MG capsule Take 1 capsule (100 mg total) by mouth 2 (two) times daily. 02/01/19   Rayburn, Neta Mends, PA-C  gabapentin (NEURONTIN) 100 MG capsule Take 1 capsule (100 mg total) by mouth 3 (three) times daily. Patient taking differently: Take 100 mg by mouth 2 (two) times daily.  12/31/18 12/31/19  Caren Griffins, MD  oxyCODONE (OXY IR/ROXICODONE) 5 MG immediate release tablet Take 1 tablet (5 mg total) by mouth every 4 (four) hours as needed for severe pain. 02/01/19   Rayburn, Neta Mends, PA-C  polyethylene glycol (MIRALAX / GLYCOLAX) packet Take 17 g by mouth daily as needed for mild constipation. 01/21/19   Charlynne Cousins, MD  warfarin (COUMADIN) 5 MG tablet Take 1 tablet (5 mg total) by mouth daily. 02/07/19   [name], MD    Physical Exam:  Constitutional: NAD, calm, comfortable Vitals:   02/09/19 1545 02/09/19 1600 02/09/19 1613 02/09/19 1630  BP: 113/89 (!) 108/94 (!) 108/94 123/76  Pulse: (!) 129 (!) 130 (!) 129 (!) 127  Resp: 17 19  20   Temp:      TempSrc:      SpO2: 96% 100%  99%  Weight:      Height:       Eyes: PERRL, lids and conjunctivae normal ENMT: Mucous membranes are moist. Posterior pharynx clear of any exudate or lesions.Normal dentition.  Neck: normal, supple, no masses, no thyromegaly Respiratory: clear to auscultation bilaterally, no wheezing, no crackles. Normal respiratory effort. No accessory muscle use.  Cardiovascular: Tachycardic rate and rhythm, no murmurs / rubs / gallops. No extremity edema. 2+ pedal pulses. No carotid bruits.  Abdomen: no tenderness, no masses palpated. No hepatosplenomegaly. Bowel sounds positive.  Musculoskeletal: no clubbing /  cyanosis. No joint deformity upper and lower extremities. Good ROM, no contractures. Normal muscle tone.  Left foot wound clean and dry Skin: no rashes, lesions, ulcers. No induration Neurologic: CN 2-12 grossly intact. Sensation intact, DTR normal. Strength 5/5 in all 4.  Psychiatric: Normal judgment and insight. Alert and oriented x 3. Normal mood.    Labs on Admission: I have personally reviewed following labs and imaging studies  CBC: Recent Labs  Lab 02/09/19 1224  WBC 3.4*  HGB 12.6*  HCT 39.2  MCV 77.3*  PLT 096   Basic Metabolic Panel: Recent Labs  Lab 02/09/19 1224  NA 136  K 4.5  CL 105  CO2 23  GLUCOSE 106*  BUN 10  CREATININE 0.86  CALCIUM 9.2   GFR: Estimated Creatinine Clearance: 92.5 mL/min (by C-G formula based on SCr of 0.86 mg/dL). Liver Function Tests: Recent Labs  Lab 02/09/19 1224  AST 28  ALT 18  ALKPHOS 53  BILITOT 0.3  PROT 6.9  ALBUMIN 3.7   No results for input(s): LIPASE, AMYLASE in the last 168 hours. No results for input(s): AMMONIA in the last 168 hours. Coagulation Profile: Recent Labs  Lab 02/09/19 1130 02/09/19 1144 02/09/19 1224  INR NCD 8.0* >10.0*   Urine analysis:    Component Value Date/Time   COLORURINE RED (A) 11/01/2018 2149   APPEARANCEUR TURBID (A) 11/01/2018 2149   LABSPEC 1.018 11/01/2018 2149   PHURINE 7.0 11/01/2018 2149   GLUCOSEU NEGATIVE 11/01/2018 2149   HGBUR MODERATE (A) 11/01/2018 2149   [place] NEGATIVE 11/01/2018 2149   [place] NEGATIVE 11/01/2018 2149   PROTEINUR 100 (A) 11/01/2018 2149   UROBILINOGEN 1.0 01/25/2013 0825   NITRITE NEGATIVE 11/01/2018 2149   LEUKOCYTESUR NEGATIVE 11/01/2018 2149    Radiological Exams on Admission: No results found.  EKG: Independently reviewed.  It is tach with left anterior fascicular block and abnormal ration unchanged when compared to prior  Assessment/Plan Principal Problem:   SVT (supraventricular tachycardia) (HCC) Active Problems:    Supratherapeutic INR   Alcohol withdrawal (HCC)   Alcohol abuse   Personal history of pulmonary embolism   Impacted cerumen of both ears   1.  SVT: Appears to be chronic previous admissions have shown that his heart rate have been elevated.  He responded very well to Ativan in the emergency department.  They be related to mild alcohol withdrawal as the patient drinks a bottle of wine a [place].  Patient started on alcohol withdrawal protocol  2.  Supratherapeutic INR: Patient received 2.5 mg of vitamin K in the emergency department will recheck PT and INR in the morning.  3.  Possible alcohol withdrawal: Patient responded very well to low-dose Ativan in the emergency department we will continue to monitor CIWA scale and treat accordingly.  4.  Alcohol abuse: Noted.  Patient would benefit from AA.  5.  Personal history of pulmonary embolism: Noted patient is on lifelong anticoagulation with warfarin.  6.  Impacted cerumen of both ears: Patient does use Debrox but has not had very much success lately will order Debrox with bulb syringe.  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 if INR is less than 7 and heart rate is stable Consults called: None Admission status: Observation   Medication reconciliation pending at the time of admission  Lady Deutscher MD [place] [facility] Pager [phone]  How to contact the [facility] Attending or Consulting provider [place] or covering provider during after hours [place], for this patient?  1. Check the care team in [facility] and look for a) attending/consulting TR[facility] provider listed and b) the [facility] team listed 2. Log into [email or web address] and use [place]'s universal password to access. If you do not have the password, please contact the hospital operator. 3. Locate the [facility] provider you are looking for under [facility] and page to a number that you can be directly reached. 4. If you still have difficulty reaching the provider, please page the [facility] (Director on Call) for the Hospitalists listed on amion for assistance.  If 7PM-7AM, please contact night-coverage [email or web address] Password [facility]  02/09/2019, 4:57 PM

## 2019-02-09 NOTE — Plan of Care (Signed)
  Problem: Education: Goal: Knowledge of General Education information will improve Description Including pain rating scale, medication(s)/side effects and non-pharmacologic comfort measures Outcome: Progressing   Problem: Health Behavior/Discharge Planning: Goal: Ability to manage health-related needs will improve Outcome: Progressing   Problem: Clinical Measurements: Goal: Ability to maintain clinical measurements within normal limits will improve Outcome: Progressing Goal: Will remain free from infection Outcome: Progressing Goal: Diagnostic test results will improve Outcome: Progressing   Problem: Clinical Measurements: Goal: Respiratory complications will improve Outcome: Completed/Met

## 2019-02-09 NOTE — ED Notes (Signed)
ED TO INPATIENT HANDOFF REPORT  ED Nurse Name and Phone #: Elmyra Ricks, RN  S Name/Age/Gender Leonard Bentley 58 y.o. male Room/Bed: 018C/018C  Code Status   Code Status: Full Code  Home/SNF/Other Home Patient oriented to: self, place, time and situation Is this baseline? Yes   Triage Complete: Triage complete  Chief Complaint Elevated INR  Triage Note Pt coming from [facility] for an elevated INR level. Pt reports that he does take coumadin and has been taking it as prescribed. Pt reports that he hasn't had his INR level checked in 3 weeks. Pt reporting pain in his left foot where he had a toe amputated about three weeks ago but denies any other complaints.    Allergies Allergies  Allergen Reactions  . Other Other (See Comments)    "Cactus" blisters on tongue    Level of Care/Admitting Diagnosis ED Disposition    ED Disposition Condition [place]: [place] [[id number]]  Level of Care: Telemetry Medical [104]  I expect the patient will be discharged within 24 hours: No (not a candidate for 5C-Observation unit)  Diagnosis: SVT (supraventricular tachycardia) [facility]) [937169]  Admitting Physician: Lady Deutscher [[id number]]  Attending Physician: Lady Deutscher [[id number]]  PT Class (Do Not Modify): Observation [104]  PT Acc Code (Do Not Modify): Observation [10022]       B Medical/Surgery History Past Medical History:  Diagnosis Date  . Acute pulmonary embolism ([place]) 04/11/2016  . Anxiety   . Arthritis   . Atrial tachycardia ([place]) 01/06/2016  . Depression   . DTs (delirium tremens) ([place])   . Dyspnea   . ED (erectile dysfunction)   . ETOH abuse   . Hypertension   . Lymphoma, non Hodgkin's 09/25/2011   Stage 2  . Mitral regurgitation 01/20/2016  . Mobitz I 10/09/2015  . Murmur 01/06/2016  . Occasional tremors   . PAC (premature atrial contraction) 10/09/2015  . S/P minimally-invasive mitral valve repair  10/27/2016   Complex valvuloplasty including artificial Gore-tex neochord placement x6 and 34 mm Edwards Physio II ring annuloplasty via right mini thoracotomy approach   Past Surgical History:  Procedure Laterality Date  . AMPUTATION Left 01/12/2019   Procedure: LEFT GREAT TOE AMPUTATION;  Surgeon: [name], MD;  Location: [facility];  Service: Orthopedics;  Laterality: Left;  . CARDIAC CATHETERIZATION N/A 08/13/2016   Procedure: Right/Left Heart Cath and Coronary Angiography;  Surgeon: [name], MD;  Location: [place] [facility];  Service: Cardiovascular;  Laterality: N/A;  . MITRAL VALVE REPAIR Right 10/27/2016   Procedure: MINIMALLY INVASIVE MITRAL VALVE REPAIR (MVR) USING 34 PHYSIO II ANNULOPLASTY RING;  Surgeon: [name], MD;  Location: [place];  Service: Open Heart Surgery;  Laterality: Right;  . SKIN BIOPSY Right 04/04/2018   right mid medial anterior tibial shave  see report in chart  . TEE WITHOUT CARDIOVERSION N/A 02/11/2016   Procedure: TRANSESOPHAGEAL ECHOCARDIOGRAM (TEE);  Surgeon: [name], MD;  Location: [facility];  Service: Cardiovascular;  Laterality: N/A;  . TEE WITHOUT CARDIOVERSION N/A 10/27/2016   Procedure: TRANSESOPHAGEAL ECHOCARDIOGRAM (TEE);  Surgeon: [name], MD;  Location: [place];  Service: Open Heart Surgery;  Laterality: N/A;  . TRIGGER FINGER RELEASE Bilateral      A IV Location/Drains/Wounds Patient Lines/Drains/Airways Status   Active Line/Drains/Airways    Name:   Placement date:   Placement time:   Site:   Days:   Peripheral IV  02/09/19 Right Forearm   02/09/19    1224    Forearm   less than 1   Incision (Closed) 01/12/19 Foot Left   01/12/19    0957     28          Intake/Output Last 24 hours No intake or output data in the 24 hours ending 02/09/19 1707  Labs/Imaging Results for orders placed or performed during the hospital encounter of 02/09/19 (from the past 48 hour(s))  Comprehensive metabolic panel     Status:  Abnormal   Collection Time: 02/09/19 12:24 PM  Result Value Ref Range   Sodium 136 135 - 145 mmol/L   Potassium 4.5 3.5 - 5.1 mmol/L   Chloride 105 98 - 111 mmol/L   CO2 23 22 - 32 mmol/L   Glucose, Bld 106 (H) 70 - 99 mg/dL   BUN 10 6 - 20 mg/dL   Creatinine, Ser 0.86 0.61 - 1.24 mg/dL   Calcium 9.2 8.9 - 10.3 mg/dL   Total Protein 6.9 6.5 - 8.1 g/dL   Albumin 3.7 3.5 - 5.0 g/dL   AST 28 15 - 41 U/L   ALT 18 0 - 44 U/L   Alkaline Phosphatase 53 38 - 126 U/L   Total Bilirubin 0.3 0.3 - 1.2 mg/dL   GFR calc non Af Amer >60 >60 mL/min   GFR calc Af Amer >60 >60 mL/min   Anion gap 8 5 - 15    Comment: Performed at [facility], [address] [place], [place] [address]  CBC     Status: Abnormal   Collection Time: 02/09/19 12:24 PM  Result Value Ref Range   WBC 3.4 (L) 4.0 - 10.5 K/uL   RBC 5.07 4.22 - 5.81 MIL/uL   Hemoglobin 12.6 (L) 13.0 - 17.0 g/dL   HCT 39.2 39.0 - 52.0 %   MCV 77.3 (L) 80.0 - 100.0 fL   MCH 24.9 (L) 26.0 - 34.0 pg   MCHC 32.1 30.0 - 36.0 g/dL   RDW 14.9 11.5 - 15.5 %   Platelets 229 150 - 400 K/uL   nRBC 0.9 (H) 0.0 - 0.2 %    Comment: Performed at [facility] [address] [place], [place] [address]  Protime-INR     Status: Abnormal   Collection Time: 02/09/19 12:24 PM  Result Value Ref Range   Prothrombin Time >90.0 (H) 11.4 - 15.2 seconds    Comment: REPEATED TO VERIFY   INR >10.0 (HH) 0.8 - 1.2    Comment: REPEATED TO VERIFY CRITICAL RESULT CALLED TO, READ BACK BY AND VERIFIED WITH: WELCH B AT 1348 ON 02/09/2019 BY SAINVILUS S Performed at [facility], [place] [address] [place], [place] [address]   TSH     Status: None   Collection Time: 02/09/19  4:00 PM  Result Value Ref Range   TSH 0.619 0.350 - 4.500 uIU/mL    Comment: Performed by a 3rd Generation assay with a functional sensitivity of <=0.01 uIU/mL. Performed at [facility], [place] [address], [place], [place] [address]    No results found.  Pending  Labs Unresulted Labs (From admission, onward)    Start     Ordered   02/10/19 0500  CBC with Differential/Platelet  Tomorrow morning,   R     02/09/19 1613   02/10/19 0500  TSH  Tomorrow morning,   R     02/09/19 1657   02/10/19 0500  Basic metabolic panel  Tomorrow morning,   R     02/09/19 1657   02/10/19 0500  CBC  Tomorrow morning,   R     02/09/19 1657   02/10/19 0500  Protime-INR  Tomorrow morning,   R     02/09/19 1657          Vitals/Pain Today's Vitals   02/09/19 1545 02/09/19 1600 02/09/19 1613 02/09/19 1630  BP: 113/89 (!) 108/94 (!) 108/94 123/76  Pulse: (!) 129 (!) 130 (!) 129 (!) 127  Resp: 17 19  20   Temp:      TempSrc:      SpO2: 96% 100%  99%  Weight:      Height:      PainSc:        Isolation Precautions No active isolations  Medications Medications  LORazepam (ATIVAN) tablet 1 mg (has no administration in time range)    Or  LORazepam (ATIVAN) injection 1 mg (has no administration in time range)  thiamine (VITAMIN B-1) tablet 100 mg (has no administration in time range)    Or  thiamine (B-1) injection 100 mg (has no administration in time range)  folic acid (FOLVITE) tablet 1 mg (has no administration in time range)  multivitamin with minerals tablet 1 tablet (has no administration in time range)  LORazepam (ATIVAN) injection 0-4 mg (has no administration in time range)    Followed by  LORazepam (ATIVAN) injection 0-4 mg (has no administration in time range)  sodium chloride flush (NS) 0.9 % injection 3 mL (has no administration in time range)  sodium chloride 0.9 % 1,000 mL with thiamine 696 mg, folic acid 1 mg, multivitamins adult 10 mL infusion (has no administration in time range)  0.9 % NaCl with KCl 20 mEq/ L  infusion (has no administration in time range)  phytonadione (VITAMIN K) tablet 2.5 mg (2.5 mg Oral Given 02/09/19 1514)  oxyCODONE (Oxy IR/ROXICODONE) immediate release tablet 5 mg (5 mg Oral Given 02/09/19 1514)  acetaminophen (TYLENOL)  tablet 1,000 mg (1,000 mg Oral Given 02/09/19 1514)  LORazepam (ATIVAN) tablet 1 mg (1 mg Oral Given 02/09/19 1556)  sodium chloride 0.9 % bolus 1,000 mL (1,000 mLs Intravenous New Bag/Given 02/09/19 1617)    Mobility walks with device Low fall risk   Focused Assessments Cardiac Assessment Handoff:    Lab Results  Component Value Date   CKTOTAL 507 (H) 07/25/2018   TROPONINI <0.03 12/07/2018   No results found for: DDIMER Does the Patient currently have chest pain? No     R Recommendations: See Admitting Provider Note  Report given to:   Additional Notes:

## 2019-02-09 NOTE — Progress Notes (Addendum)
Consulted with Dr. Wynetta Emery in clinic. Prefer to administer oral vitamin K, however, no supply in the clinic. Will send off stat INR. Patient will be transferred to ED for further evaluation/management of supratherapeutic INR.  Addendum: Stat INR critical results returned - PT >120s , INR: no clotting factors detected. Informed PCP in clinic. EMS was called and patient transferred to ED after my appt with him earlier today. They will continue management of his supratherapeutic INR.

## 2019-02-10 LAB — CBC WITH DIFFERENTIAL/PLATELET
Abs Immature Granulocytes: 0.01 10*3/uL (ref 0.00–0.07)
Basophils Absolute: 0 10*3/uL (ref 0.0–0.1)
Basophils Relative: 1 %
Eosinophils Absolute: 0.1 10*3/uL (ref 0.0–0.5)
Eosinophils Relative: 4 %
HCT: 34.7 % — ABNORMAL LOW (ref 39.0–52.0)
Hemoglobin: 11.5 g/dL — ABNORMAL LOW (ref 13.0–17.0)
Immature Granulocytes: 0 %
Lymphocytes Relative: 15 %
Lymphs Abs: 0.5 10*3/uL — ABNORMAL LOW (ref 0.7–4.0)
MCH: 25.3 pg — ABNORMAL LOW (ref 26.0–34.0)
MCHC: 33.1 g/dL (ref 30.0–36.0)
MCV: 76.4 fL — ABNORMAL LOW (ref 80.0–100.0)
Monocytes Absolute: 0.8 10*3/uL (ref 0.1–1.0)
Monocytes Relative: 23 %
Neutro Abs: 1.9 10*3/uL (ref 1.7–7.7)
Neutrophils Relative %: 57 %
Platelets: 199 10*3/uL (ref 150–400)
RBC: 4.54 MIL/uL (ref 4.22–5.81)
RDW: 14.7 % (ref 11.5–15.5)
WBC: 3.3 10*3/uL — ABNORMAL LOW (ref 4.0–10.5)
nRBC: 0.9 % — ABNORMAL HIGH (ref 0.0–0.2)

## 2019-02-10 LAB — TSH: TSH: 1.203 u[IU]/mL (ref 0.350–4.500)

## 2019-02-10 LAB — BASIC METABOLIC PANEL
Anion gap: 9 (ref 5–15)
BUN: 11 mg/dL (ref 6–20)
CO2: 23 mmol/L (ref 22–32)
Calcium: 8.9 mg/dL (ref 8.9–10.3)
Chloride: 105 mmol/L (ref 98–111)
Creatinine, Ser: 0.83 mg/dL (ref 0.61–1.24)
GFR calc Af Amer: >60 mL/min (ref >60)
GFR calc non Af Amer: >60 mL/min (ref >60)
Glucose, Bld: 115 mg/dL — ABNORMAL HIGH (ref 70–99)
Potassium: 3.9 mmol/L (ref 3.5–5.1)
Sodium: 137 mmol/L (ref 135–145)

## 2019-02-10 MED ORDER — OXYCODONE HCL 5 MG PO TABS: 5.0000 mg | ORAL_TABLET | ORAL | Status: DC | PRN

## 2019-02-10 MED ORDER — PHYTONADIONE 5 MG PO TABS
2.5000 mg | ORAL_TABLET | Freq: Once | ORAL | Status: AC
  Administered 2019-02-10: 2.5 mg via ORAL
  Filled 2019-02-10: qty 1

## 2019-02-10 MED ORDER — THIAMINE HCL 100 MG PO TABS: 100.0000 mg | ORAL_TABLET | Freq: Every day | ORAL | 0 refills | Status: DC

## 2019-02-10 MED ORDER — ACETAMINOPHEN 325 MG PO TABS: 650.0000 mg | ORAL_TABLET | Freq: Four times a day (QID) | ORAL | Status: DC | PRN

## 2019-02-10 NOTE — Progress Notes (Signed)
Critical INR 7.2

## 2019-02-10 NOTE — Progress Notes (Signed)
Patient discharge order received and discharge instructions reviewed with and given to patient.  Patient provided cab voucher.  Patient taken to cab via wheelchair.

## 2019-02-10 NOTE — Discharge Summary (Signed)
Physician Discharge Summary  Leonard Bentley FAO:[id number] DOB: 02/10/1961 DOA: 02/09/2019  PCP: System, Pcp Not In  Admit date: 02/09/2019 Discharge date: 02/10/2019  Admitted From: home Discharge disposition: home   Recommendations for Outpatient Follow-Up:   1. [facility] for consideration of changing from warfarin to xarelto/eliquis (seems to have been changed due to affordability) 2. INR check on Monday-- holding warfarin until then-- patient to call [facility] to get this done 3. Alcohol moderation   Discharge Diagnosis:   Principal Problem:   SVT (supraventricular tachycardia) (HCC) Active Problems:   Alcohol abuse   Personal history of pulmonary embolism   Supratherapeutic INR   Alcohol withdrawal (HCC)   Impacted cerumen of both ears    Discharge Condition: Improved.  Diet recommendation: Low sodium, heart healthy  Wound care: None.  Code status: Full.   History of Present Illness:   Leonard Bentley is a 57 y.o. male with medical history significant of current pulmonary embolism on warfarin, hypertension, alcoholism last drank a bottle of wine last night, tobacco abuse, peripheral vascular disease, non-Hodgkin's lymphoma and chronic tachycardia who was seen at community clinic today to have his INR checked.  It came back greater than 10 and he was referred here for evaluation.  Upon arrival the patient was noted to be in SVT with a heart rate of 134.  In the emergency department he received vitamin K 2.5 mg 1 mg of oral lorazepam (this was given at 4 PM and his heart rate came down to 92 at 5:15 p.m).  Referred to me for further evaluation and management of minor alcohol withdrawal and elevated INR. Patient denies any chest pain, shortness of breath, headache, nausea, vomiting, rash, fever, chills, cough, sputum production   Hospital Course by Problem:   1.  SVT: Appears to be chronic previous admissions have shown that his heart  rate have been elevated.  He responded very well to Ativan in the emergency department.  They be related to mild alcohol withdrawal as the patient drinks a bottle of wine a Tep.   -no further epsiodes  2.  Supratherapeutic INR: Patient received 2.5 mg of vitamin K in the emergency department and INR down to 7.2-- plan to give 2.5 mg more and recheck INR on Monday  3.  Possible alcohol withdrawal:  -not planning on stopping alcohol consumption but I have encouraged cessation/moderation  4.  Alcohol abuse: Noted.  Patient would benefit from AA.  5.  Personal history of pulmonary embolism: Noted patient is on lifelong anticoagulation -? If cost concerns can be limited, can he be changed to NOAC?     Medical Consultants:      Discharge Exam:   Vitals:   02/10/19 0532 02/10/19 1044  BP: (!) 110/93 (!) 133/95  Pulse: (!) 115 95  Resp: (!) 22   Temp: 98 F (36.7 C) 97.6 F (36.4 C)  SpO2: 99% 99%   Vitals:   02/09/19 1800 02/09/19 2020 02/10/19 0532 02/10/19 1044  BP:  108/74 (!) 110/93 (!) 133/95  Pulse:  86 (!) 115 95  Resp:  18 (!) 22   Temp:  98.4 F (36.9 C) 98 F (36.7 C) 97.6 F (36.4 C)  TempSrc:  Oral Oral Oral  SpO2:  99% 99% 99%  Weight: 84.1 kg  84.4 kg   Height:        General exam: Appears calm and comfortable.   The results of  significant diagnostics from this hospitalization (including imaging, microbiology, ancillary and laboratory) are listed below for reference.     Procedures and Diagnostic Studies:   No results found.   Labs:   Basic Metabolic Panel: Recent Labs  Lab 02/09/19 1224 02/10/19 0253  NA 136 137  K 4.5 3.9  CL 105 105  CO2 23 23  GLUCOSE 106* 115*  BUN 10 11  CREATININE 0.86 0.83  CALCIUM 9.2 8.9   GFR Estimated Creatinine Clearance: 95.8 mL/min (by C-G formula based on SCr of 0.83 mg/dL). Liver Function Tests: Recent Labs  Lab 02/09/19 1224  AST 28  ALT 18  ALKPHOS 53  BILITOT 0.3  PROT 6.9  ALBUMIN  3.7   No results for input(s): LIPASE, AMYLASE in the last 168 hours. No results for input(s): AMMONIA in the last 168 hours. Coagulation profile Recent Labs  Lab 02/09/19 1130 02/09/19 1144 02/09/19 1224 02/10/19 0253  INR NCD 8.0* >10.0* 7.2*    CBC: Recent Labs  Lab 02/09/19 1224 02/10/19 0253  WBC 3.4* 3.3*  NEUTROABS  --  1.9  HGB 12.6* 11.5*  HCT 39.2 34.7*  MCV 77.3* 76.4*  PLT 229 199   Cardiac Enzymes: No results for input(s): CKTOTAL, CKMB, CKMBINDEX, TROPONINI in the last 168 hours. BNP: Invalid input(s): POCBNP CBG: No results for input(s): GLUCAP in the last 168 hours. D-Dimer No results for input(s): DDIMER in the last 72 hours. Hgb A1c No results for input(s): HGBA1C in the last 72 hours. Lipid Profile No results for input(s): CHOL, HDL, LDLCALC, TRIG, CHOLHDL, LDLDIRECT in the last 72 hours. Thyroid function studies Recent Labs    02/10/19 0253  TSH 1.203   Anemia work up No results for input(s): VITAMINB12, FOLATE, FERRITIN, TIBC, IRON, RETICCTPCT in the last 72 hours. Microbiology No results found for this or any previous visit (from the past 240 hour(s)).   Discharge Instructions:   Discharge Instructions    Diet - low sodium heart healthy   Complete by:  As directed    Discharge instructions   Complete by:  As directed    Hold coumadin until Monday-- get repeat INR at that time The antibiotic you are on (doxycycline) can cause your INR to elevate.   I have included phone numbers for "free" clinics in [place], please call and schedule appointment-- they have a pharmacy and can get most medications for either 4 or 10$   Increase activity slowly   Complete by:  As directed      Allergies as of 02/10/2019      Reactions   Other Other (See Comments)   "Cactus" caused blisters on tongue      Medication List    STOP taking these medications   chlordiazePOXIDE 5 MG capsule Commonly known as:  LIBRIUM   gabapentin 100 MG  capsule Commonly known as:  Neurontin   polyethylene glycol packet Commonly known as:  MIRALAX / GLYCOLAX   warfarin 5 MG tablet Commonly known as:  COUMADIN     TAKE these medications   acetaminophen 650 MG CR tablet Commonly known as:  TYLENOL Take 1,300 mg by mouth every 8 (eight) hours as needed for pain.   doxycycline 100 MG capsule Commonly known as:  VIBRAMYCIN Take 1 capsule (100 mg total) by mouth 2 (two) times daily.   oxyCODONE 5 MG immediate release tablet Commonly known as:  Oxy IR/ROXICODONE Take 1 tablet (5 mg total) by mouth every 4 (four) hours as needed for  severe pain.   thiamine 100 MG tablet Take 1 tablet (100 mg total) by mouth daily. Start taking on:  February 11, 2019      [facility] Follow up.   Contact information: [place] [phone] [place] [facility].   Specialty:  Internal Medicine Contact information: [place] [id number] [place] [phone]       [facility] Follow up.   Contact information: [address] [place] [address]       [facility] Follow up.   Contact information: [place] [address] [phone]           Time coordinating discharge: 25 min  Signed:  [name] DO  [facility] 02/10/2019, 10:52 AM

## 2019-02-10 NOTE — Discharge Instructions (Signed)
Get INR checked on Monday

## 2019-02-11 LAB — PROTIME-INR
INR: 7.2 (ref 0.8–1.2)
Prothrombin Time: 60.4 seconds — ABNORMAL HIGH (ref 11.4–15.2)

## 2019-02-12 ENCOUNTER — Ambulatory Visit: Payer: Self-pay | Attending: Family Medicine | Admitting: Pharmacist

## 2019-02-12 ENCOUNTER — Telehealth (INDEPENDENT_AMBULATORY_CARE_PROVIDER_SITE_OTHER): Payer: Self-pay

## 2019-02-12 ENCOUNTER — Other Ambulatory Visit: Payer: Self-pay

## 2019-02-12 DIAGNOSIS — Z86711 Personal history of pulmonary embolism: Secondary | ICD-10-CM

## 2019-02-12 DIAGNOSIS — Z9889 Other specified postprocedural states: Secondary | ICD-10-CM

## 2019-02-12 LAB — POCT INR: INR: 4.8 — AB (ref 2.0–3.0)

## 2019-02-12 NOTE — Telephone Encounter (Signed)
I called and sw pt. He answered NO to all COVID -19 questions. Has an appt tomorrow at 1pm

## 2019-02-13 ENCOUNTER — Encounter: Payer: Self-pay | Admitting: Family Medicine

## 2019-02-13 ENCOUNTER — Ambulatory Visit (INDEPENDENT_AMBULATORY_CARE_PROVIDER_SITE_OTHER): Payer: Self-pay | Admitting: Family Medicine

## 2019-02-13 ENCOUNTER — Ambulatory Visit (INDEPENDENT_AMBULATORY_CARE_PROVIDER_SITE_OTHER): Payer: Self-pay | Admitting: Physician Assistant

## 2019-02-13 ENCOUNTER — Encounter (INDEPENDENT_AMBULATORY_CARE_PROVIDER_SITE_OTHER): Payer: Self-pay | Admitting: Orthopedic Surgery

## 2019-02-13 VITALS — Wt 190.0 lb

## 2019-02-13 VITALS — Ht 69.0 in | Wt 190.0 lb

## 2019-02-13 DIAGNOSIS — Z89412 Acquired absence of left great toe: Secondary | ICD-10-CM

## 2019-02-13 DIAGNOSIS — H6122 Impacted cerumen, left ear: Secondary | ICD-10-CM

## 2019-02-13 DIAGNOSIS — L97511 Non-pressure chronic ulcer of other part of right foot limited to breakdown of skin: Secondary | ICD-10-CM

## 2019-02-13 DIAGNOSIS — S90221A Contusion of right lesser toe(s) with damage to nail, initial encounter: Secondary | ICD-10-CM

## 2019-02-13 DIAGNOSIS — R791 Abnormal coagulation profile: Secondary | ICD-10-CM

## 2019-02-13 DIAGNOSIS — S6010XA Contusion of unspecified finger with damage to nail, initial encounter: Secondary | ICD-10-CM

## 2019-02-13 NOTE — Progress Notes (Signed)
   Subjective:    Patient ID: Rennie Rouch Boord, male    DOB: 08-11-1961, 58 y.o.   MRN: [id number]  HPI Documentation for virtual telephone encounter.  Documentation for virtual audio and video telecommunications through zoom encounter: The patient was located at home. The provider was located in the office. The patient did consent to this visit and is aware of possible charges through their insurance for this visit.  The other persons participating in this telemedicine service were none. This virtual service is not related to other E/M service within previous 7 days. He complains of difficulty hearing from his left ear.  He was noted to have cerumen in his ear while he was in the hospital and has been using drops for that.  No ear pain, fever, chills.   Review of Systems     Objective:   Physical Exam Alert and in no distress however could not examine the ear.      Assessment & Plan:  Impacted cerumen of left ear By his history he has a cerumen impaction.  I recommend that he go to the drugstore and asked the pharmacist to help him with finding an instrument that he can lavage his ear and if he still has difficulty, he is to call and we will have him come in for lavage here.

## 2019-02-13 NOTE — Progress Notes (Addendum)
Office Visit Note   Patient: Leonard Bentley           Date of Birth: November 04, 1961           MRN: [id number] Visit Date: 02/13/2019              Requested by: [name], MD [address] [place], [place] [address] PCP: [name], MD  Chief Complaint  Patient presents with  . Left Foot - Follow-up  . Right Foot - Follow-up      HPI: The patient is a 58 yo gentleman who is seen for post operative follow up following left great toe amputation on 01/12/2019. He also developed an ulcer over the right lateral foot and has been using iodosorb ointment to both areas daily. He has been taking Doxycycline, but recently was hospitalized with an INR > 10 and reports he is off of warfarin for now. He is an alcoholic and drinks a bottle of wine daily. He reports his INR yesterday was 4.8. He has been walking weight bearing in a post op shoe with a cane. He is also concerned about a dark toe nail on his right 2nd toe.     Assessment & Plan: Visit Diagnoses:  1. Status post amputation of left great toe ([place])   2. Skin ulcer of foot, limited to breakdown of skin, right ([place])   3. Supratherapeutic INR   4. Subungual hematoma of second toe of right foot, initial encounter   5. Subungual hematoma of finger of left hand, initial encounter     Plan: Continue iodosorb ointment to the left foot amputation site and right foot lateral ulcer daily after showering. Complete Doxycycline. Off load feet as much as possible and elevate. Counseled patient the dark areas under the toe nail and finger nail are likely due to subungual hematoma due to high INR. The patient reports the left index finger did get smashed in a door. He does not know of any trauma to the right 2nd toe.   Follow up in 2 weeks.   Follow-Up Instructions: Return in about 2 weeks (around 02/27/2019).   Ortho Exam  Patient is alert, oriented, no adenopathy, well-dressed, normal affect, normal respiratory effort. The left 1st toe  amputation site is less macerated today and has scant drainage . No cellulitis of the left foot . Good palpable pedal pulses. The right lateral foot ulcer is smaller and without signs of infection today. The right 2nd toe nail has a horizontal dark line in the mid toenail.   Imaging: No results found.    Labs: Lab Results  Component Value Date   HGBA1C 5.2 10/25/2016   HGBA1C 6.1 (H) 04/03/2016   HGBA1C 5.9 (H) 08/18/2015   LABURIC 5.2 12/09/2018   LABURIC 7.4 08/18/2015   LABURIC 4.1 12/01/2011   REPTSTATUS 01/14/2019 FINAL 01/09/2019   CULT  01/09/2019    NO GROWTH 5 DAYS Performed at [facility], [place] [address], [place], [place] [address]      Lab Results  Component Value Date   ALBUMIN 3.7 02/09/2019   ALBUMIN 3.2 (L) 01/10/2019   ALBUMIN 3.4 (L) 12/28/2018   LABURIC 5.2 12/09/2018   LABURIC 7.4 08/18/2015   LABURIC 4.1 12/01/2011    Body mass index is 28.06 kg/m.  Orders:  No orders of the defined types were placed in this encounter.  No orders of the defined types were placed in this encounter.    Procedures: No procedures  performed  Clinical Data: No additional findings.  ROS:  All other systems negative, except as noted in the HPI. Review of Systems  Objective: Vital Signs: Ht 5\' 9"  (1.753 m)   Wt 190 lb (86.2 kg)   BMI 28.06 kg/m   Specialty Comments:  No specialty comments available.  PMFS History: Patient Active Problem List   Diagnosis Date Noted  . Impacted cerumen of both ears 02/09/2019  . Osteomyelitis of great toe of left foot ([place]) 01/09/2019  . Foot ulcer, left ([place]) 12/27/2018  . Cellulitis 12/07/2018  . Bleeding from left ear 11/28/2018  . HTN (hypertension) 11/02/2018  . UTI (urinary tract infection) 11/02/2018  . Ecchymosis   . Tachyarrhythmia 08/30/2018  . Hypomagnesemia 08/30/2018  . Recurrent pulmonary emboli ([place])   . Hematuria   . Delirium tremens ([place])   . LFTs abnormal   . Alcoholic hepatitis without  ascites   . Alcohol withdrawal ([place]) 06/04/2018  . Dizziness   . Posterior tibial tendon dysfunction 02/21/2018  . Supratherapeutic INR 11/27/2016  . Dehydration 11/27/2016  . SVT (supraventricular tachycardia) ([place])   . S/P minimally-invasive mitral valve repair 10/27/2016  . History of pulmonary embolus (PE) 04/11/2016  . Left sided numbness   . Anxiety 04/02/2016  . Panic attacks 04/02/2016  . Mitral valve prolapse   . Mitral regurgitation 01/20/2016  . Atrial tachycardia ([place]) 01/06/2016  . Murmur 01/06/2016  . Mobitz I 10/09/2015  . PAC (premature atrial contraction) 10/09/2015  . Arthritis 08/14/2015  . Neuropathy due to chemotherapeutic drug ([place]) 03/09/2013  . Leucopenia 01/31/2013  . Personal history of pulmonary embolism 12/28/2012  . Alcohol abuse 08/24/2012  . Cigarette smoker 08/24/2012  . Hemorrhoid 03/08/2012  . Anemia 12/10/2011  . History of non-Hodgkin's lymphoma 09/25/2011   Past Medical History:  Diagnosis Date  . Acute pulmonary embolism ([place]) 04/11/2016  . Anxiety   . Arthritis   . Atrial tachycardia ([place]) 01/06/2016  . Depression   . DTs (delirium tremens) ([place])   . Dyspnea   . ED (erectile dysfunction)   . ETOH abuse   . Hypertension   . Lymphoma, non Hodgkin's 09/25/2011   Stage 2  . Mitral regurgitation 01/20/2016  . Mobitz I 10/09/2015  . Murmur 01/06/2016  . Occasional tremors   . PAC (premature atrial contraction) 10/09/2015  . S/P minimally-invasive mitral valve repair 10/27/2016   Complex valvuloplasty including artificial Gore-tex neochord placement x6 and 34 mm Edwards Physio II ring annuloplasty via right mini thoracotomy approach    Family History  Problem Relation Age of Onset  . Cancer Mother        BREAST(BONE)  . Cancer Father        PANCREATIC  . Hypertension Maternal Grandmother   . Stroke Maternal Aunt   . Heart attack Neg Hx     Past Surgical History:  Procedure Laterality Date  . AMPUTATION Left 01/12/2019   Procedure:  LEFT GREAT TOE AMPUTATION;  Surgeon: [name], MD;  Location: [facility];  Service: Orthopedics;  Laterality: Left;  . CARDIAC CATHETERIZATION N/A 08/13/2016   Procedure: Right/Left Heart Cath and Coronary Angiography;  Surgeon: [name], MD;  Location: [facility];  Service: Cardiovascular;  Laterality: N/A;  . MITRAL VALVE REPAIR Right 10/27/2016   Procedure: MINIMALLY INVASIVE MITRAL VALVE REPAIR (MVR) USING 34 PHYSIO II ANNULOPLASTY RING;  Surgeon: [name], MD;  Location: [facility];  Service: Open Heart Surgery;  Laterality: Right;  . SKIN BIOPSY Right  04/04/2018   right mid medial anterior tibial shave  see report in chart  . TEE WITHOUT CARDIOVERSION N/A 02/11/2016   Procedure: TRANSESOPHAGEAL ECHOCARDIOGRAM (TEE);  Surgeon: [name], MD;  Location: [facility];  Service: Cardiovascular;  Laterality: N/A;  . TEE WITHOUT CARDIOVERSION N/A 10/27/2016   Procedure: TRANSESOPHAGEAL ECHOCARDIOGRAM (TEE);  Surgeon: [name], MD;  Location: [facility];  Service: Open Heart Surgery;  Laterality: N/A;  . TRIGGER FINGER RELEASE Bilateral    Social History   Occupational History  . Occupation: unemployed  Tobacco Use  . Smoking status: Current Every Cadle Smoker    Packs/[place]: 1.00    Years: 40.00    Pack years: 40.00    Types: Cigarettes, E-cigarettes  . Smokeless tobacco: Former Systems developer    Quit date: 1985  . Tobacco comment: vapes all [place]  Substance and Sexual Activity  . Alcohol use: Yes    Alcohol/week: 0.0 standard drinks    Comment: 1-2 bottles of red wine a [place]  . Drug use: No  . Sexual activity: Not Currently

## 2019-02-14 ENCOUNTER — Other Ambulatory Visit: Payer: Self-pay

## 2019-02-14 ENCOUNTER — Ambulatory Visit: Payer: Self-pay | Attending: Family Medicine | Admitting: Pharmacist

## 2019-02-14 DIAGNOSIS — Z86711 Personal history of pulmonary embolism: Secondary | ICD-10-CM

## 2019-02-14 DIAGNOSIS — Z9889 Other specified postprocedural states: Secondary | ICD-10-CM

## 2019-02-14 LAB — POCT INR: INR: 3.4 — AB (ref 2.0–3.0)

## 2019-02-14 MED ORDER — WARFARIN SODIUM 2.5 MG PO TABS: ORAL_TABLET | ORAL | 2 refills | Status: DC

## 2019-02-20 ENCOUNTER — Encounter: Payer: Self-pay | Admitting: Pharmacist

## 2019-02-22 ENCOUNTER — Ambulatory Visit: Payer: Self-pay | Attending: Family Medicine | Admitting: Pharmacist

## 2019-02-22 ENCOUNTER — Other Ambulatory Visit: Payer: Self-pay

## 2019-02-22 DIAGNOSIS — Z9889 Other specified postprocedural states: Secondary | ICD-10-CM

## 2019-02-22 DIAGNOSIS — Z86711 Personal history of pulmonary embolism: Secondary | ICD-10-CM

## 2019-02-22 LAB — POCT INR: INR: 1.9 — AB (ref 2.0–3.0)

## 2019-02-23 ENCOUNTER — Emergency Department (HOSPITAL_COMMUNITY): Payer: Self-pay

## 2019-02-23 ENCOUNTER — Other Ambulatory Visit: Payer: Self-pay

## 2019-02-23 ENCOUNTER — Ambulatory Visit (HOSPITAL_COMMUNITY)
Admission: EM | Admit: 2019-02-23 | Discharge: 2019-02-23 | Disposition: A | Discharge status: Home / Self Care | Payer: Self-pay

## 2019-02-23 ENCOUNTER — Encounter (HOSPITAL_COMMUNITY): Payer: Self-pay | Admitting: Emergency Medicine

## 2019-02-23 ENCOUNTER — Encounter (HOSPITAL_COMMUNITY): Payer: Self-pay | Admitting: *Deleted

## 2019-02-23 ENCOUNTER — Emergency Department (HOSPITAL_COMMUNITY)
Admission: EM | Admit: 2019-02-23 | Discharge: 2019-02-23 | Disposition: A | Discharge status: Home / Self Care | Payer: Self-pay | Attending: Emergency Medicine | Admitting: Emergency Medicine

## 2019-02-23 DIAGNOSIS — Z86711 Personal history of pulmonary embolism: Secondary | ICD-10-CM | POA: Insufficient documentation

## 2019-02-23 DIAGNOSIS — R002 Palpitations: Secondary | ICD-10-CM | POA: Insufficient documentation

## 2019-02-23 DIAGNOSIS — I441 Atrioventricular block, second degree: Secondary | ICD-10-CM | POA: Insufficient documentation

## 2019-02-23 DIAGNOSIS — R0602 Shortness of breath: Secondary | ICD-10-CM | POA: Insufficient documentation

## 2019-02-23 DIAGNOSIS — F17200 Nicotine dependence, unspecified, uncomplicated: Secondary | ICD-10-CM | POA: Insufficient documentation

## 2019-02-23 DIAGNOSIS — R000 Tachycardia, unspecified: Secondary | ICD-10-CM

## 2019-02-23 DIAGNOSIS — I10 Essential (primary) hypertension: Secondary | ICD-10-CM | POA: Insufficient documentation

## 2019-02-23 DIAGNOSIS — Z856 Personal history of leukemia: Secondary | ICD-10-CM | POA: Insufficient documentation

## 2019-02-23 DIAGNOSIS — Z7901 Long term (current) use of anticoagulants: Secondary | ICD-10-CM | POA: Insufficient documentation

## 2019-02-23 DIAGNOSIS — H6122 Impacted cerumen, left ear: Secondary | ICD-10-CM | POA: Insufficient documentation

## 2019-02-23 LAB — CBC WITH DIFFERENTIAL/PLATELET
Abs Immature Granulocytes: 0 10*3/uL (ref 0.00–0.07)
Basophils Absolute: 0.1 10*3/uL (ref 0.0–0.1)
Basophils Relative: 2 %
Eosinophils Absolute: 0.1 10*3/uL (ref 0.0–0.5)
Eosinophils Relative: 5 %
HCT: 39.8 % (ref 39.0–52.0)
Hemoglobin: 12.6 g/dL — ABNORMAL LOW (ref 13.0–17.0)
Immature Granulocytes: 0 %
Lymphocytes Relative: 17 %
Lymphs Abs: 0.5 10*3/uL — ABNORMAL LOW (ref 0.7–4.0)
MCH: 24.4 pg — ABNORMAL LOW (ref 26.0–34.0)
MCHC: 31.7 g/dL (ref 30.0–36.0)
MCV: 77.1 fL — ABNORMAL LOW (ref 80.0–100.0)
Monocytes Absolute: 0.5 10*3/uL (ref 0.1–1.0)
Monocytes Relative: 15 %
Neutro Abs: 1.9 10*3/uL (ref 1.7–7.7)
Neutrophils Relative %: 61 %
Platelets: 246 10*3/uL (ref 150–400)
RBC: 5.16 MIL/uL (ref 4.22–5.81)
RDW: 16.3 % — ABNORMAL HIGH (ref 11.5–15.5)
WBC: 3 10*3/uL — ABNORMAL LOW (ref 4.0–10.5)
nRBC: 1 % — ABNORMAL HIGH (ref 0.0–0.2)

## 2019-02-23 LAB — COMPREHENSIVE METABOLIC PANEL
ALT: 21 U/L (ref 0–44)
AST: 37 U/L (ref 15–41)
Albumin: 3.8 g/dL (ref 3.5–5.0)
Alkaline Phosphatase: 58 U/L (ref 38–126)
Anion gap: 13 (ref 5–15)
BUN: 12 mg/dL (ref 6–20)
CO2: 23 mmol/L (ref 22–32)
Calcium: 9.7 mg/dL (ref 8.9–10.3)
Chloride: 102 mmol/L (ref 98–111)
Creatinine, Ser: 0.95 mg/dL (ref 0.61–1.24)
GFR calc Af Amer: >60 mL/min (ref >60)
GFR calc non Af Amer: >60 mL/min (ref >60)
Glucose, Bld: 125 mg/dL — ABNORMAL HIGH (ref 70–99)
Potassium: 4.4 mmol/L (ref 3.5–5.1)
Sodium: 138 mmol/L (ref 135–145)
Total Bilirubin: 0.7 mg/dL (ref 0.3–1.2)
Total Protein: 7.6 g/dL (ref 6.5–8.1)

## 2019-02-23 LAB — PROTIME-INR
INR: 1.8 — ABNORMAL HIGH (ref 0.8–1.2)
Prothrombin Time: 20.9 seconds — ABNORMAL HIGH (ref 11.4–15.2)

## 2019-02-23 LAB — I-STAT TROPONIN, ED: Troponin i, poc: 0.01 ng/mL (ref 0.00–0.08)

## 2019-02-23 MED ORDER — IOHEXOL 350 MG/ML SOLN
75.0000 mL | Freq: Once | INTRAVENOUS | Status: AC | PRN
  Administered 2019-02-23: 75 mL via INTRAVENOUS

## 2019-02-23 NOTE — ED Provider Notes (Signed)
[facility] EMERGENCY DEPARTMENT Provider Note   CSN: [id number] Arrival date & time: 02/23/19  1443    History   Chief Complaint Chief Complaint  Patient presents with   Ear Fullness    HPI Leonard Bentley is a 58 y.o. male with a PMH of PE, Anxiety, Atrial Tachycardia, alcohol abuse, Mobitz I, and s/p minimally invasive mitral valve repair on warfarin presenting with left sided ear fullness onset months ago. Patient reports he was evaluated at urgent care and had an episode palpitations and shortness of breath, and was advised to come to the ER for further evaluation. Patient denies chest pain. Patient reports symptoms have resolved on their own. Patient states he becomes very anxious at doctor appointments. Patient reports tobacco use and alcohol use. Patient denies drug use. Patient reports constant shortness of breath, but states this is chronic. Patient denies leg pain or leg edema. Patient reports a left foot amputation 4 weeks ago. Patient reports he has been sedentary at home. Patient states he has had 2 pulmonary embolisms in the past. Patient states he has been taking his warfarin as prescribed but his INR yesterday was 1.9. Patient denies fever, cough, congestion, rhinorrhea, nausea, vomiting, abdominal pain, sick contacts, or recent travel. Patient denies ear pain, facial swelling, or hearing loss.      HPI  Past Medical History:  Diagnosis Date   Acute pulmonary embolism ([place]) 04/11/2016   Anxiety    Arthritis    Atrial tachycardia ([place]) 01/06/2016   Depression    DTs (delirium tremens) ([place])    Dyspnea    ED (erectile dysfunction)    ETOH abuse    Hypertension    Lymphoma, non Hodgkin's 09/25/2011   Stage 2   Mitral regurgitation 01/20/2016   Mobitz I 10/09/2015   Murmur 01/06/2016   Occasional tremors    PAC (premature atrial contraction) 10/09/2015   S/P minimally-invasive mitral valve repair 10/27/2016   Complex valvuloplasty  including artificial Gore-tex neochord placement x6 and 34 mm Edwards Physio II ring annuloplasty via right mini thoracotomy approach    Patient Active Problem List   Diagnosis Date Noted   Impacted cerumen of both ears 02/09/2019   Osteomyelitis of great toe of left foot (Leonard Bentley) 01/09/2019   Foot ulcer, left (Leonard Bentley) 12/27/2018   Cellulitis 12/07/2018   Bleeding from left ear 11/28/2018   HTN (hypertension) 11/02/2018   UTI (urinary tract infection) 11/02/2018   Ecchymosis    Tachyarrhythmia 08/30/2018   Hypomagnesemia 08/30/2018   Recurrent pulmonary emboli (HCC)    Hematuria    Delirium tremens (HCC)    LFTs abnormal    Alcoholic hepatitis without ascites    Alcohol withdrawal (Leonard Bentley) 06/04/2018   Dizziness    Posterior tibial tendon dysfunction 02/21/2018   Supratherapeutic INR 11/27/2016   Dehydration 11/27/2016   SVT (supraventricular tachycardia) ([name])    S/P minimally-invasive mitral valve repair 10/27/2016   History of pulmonary embolus (PE) 04/11/2016   Left sided numbness    Anxiety 04/02/2016   Panic attacks 04/02/2016   Mitral valve prolapse    Mitral regurgitation 01/20/2016   Atrial tachycardia (Leonard Bentley) 01/06/2016   Murmur 01/06/2016   Mobitz I 10/09/2015   PAC (premature atrial contraction) 10/09/2015   Arthritis 08/14/2015   Neuropathy due to chemotherapeutic drug (Leonard Bentley) 03/09/2013   Leucopenia 01/31/2013   Personal history of pulmonary embolism 12/28/2012   Alcohol abuse 08/24/2012   Cigarette smoker 08/24/2012   Hemorrhoid 03/08/2012   Anemia 12/10/2011  History of non-Hodgkin's lymphoma 09/25/2011    Past Surgical History:  Procedure Laterality Date   AMPUTATION Left 01/12/2019   Procedure: LEFT GREAT TOE AMPUTATION;  Surgeon: Leonard Minion, MD;  Location: [facility];  Service: Orthopedics;  Laterality: Left;   CARDIAC CATHETERIZATION N/A 08/13/2016   Procedure: Right/Left Heart Cath and Coronary Angiography;  Surgeon:  Leonard Booze, MD;  Location: [facility];  Service: Cardiovascular;  Laterality: N/A;   MITRAL VALVE REPAIR Right 10/27/2016   Procedure: MINIMALLY INVASIVE MITRAL VALVE REPAIR (MVR) USING 34 PHYSIO II ANNULOPLASTY RING;  Surgeon: Leonard Alberts, MD;  Location: [facility];  Service: Open Heart Surgery;  Laterality: Right;   SKIN BIOPSY Right 04/04/2018   right mid medial anterior tibial shave  see report in chart   TEE WITHOUT CARDIOVERSION N/A 02/11/2016   Procedure: TRANSESOPHAGEAL ECHOCARDIOGRAM (TEE);  Surgeon: Leonard Latch, MD;  Location: [place];  Service: Cardiovascular;  Laterality: N/A;   TEE WITHOUT CARDIOVERSION N/A 10/27/2016   Procedure: TRANSESOPHAGEAL ECHOCARDIOGRAM (TEE);  Surgeon: Leonard Alberts, MD;  Location: [facility];  Service: Open Heart Surgery;  Laterality: N/A;   TRIGGER FINGER RELEASE Bilateral         Home Medications    Prior to Admission medications   Medication Sig Start Date End Date Taking? Authorizing Provider  acetaminophen (TYLENOL) 650 MG CR tablet Take 1,300 mg by mouth every 8 (eight) hours as needed for pain.    Yes [provider]  carbamide peroxide (DEBROX) 6.5 % OTIC solution Place 5 drops into the left ear 2 (two) times daily as needed (to cleanse the ear canal).    Yes [provider]  doxycycline (VIBRAMYCIN) 100 MG capsule Take 1 capsule (100 mg total) by mouth 2 (two) times daily. 02/01/19  Yes Rayburn, Neta Mends, PA-C  oxyCODONE (OXY IR/ROXICODONE) 5 MG immediate release tablet Take 1 tablet (5 mg total) by mouth every 4 (four) hours as needed for severe pain. 02/01/19  Yes Rayburn, Neta Mends, PA-C  warfarin (COUMADIN) 2.5 MG tablet Take 1 tablet daily or as directed by the Coumadin clinic. Patient taking differently: Take 2.5 mg by mouth every evening.  02/14/19  Yes Charlott Rakes, MD  thiamine 100 MG tablet Take 1 tablet (100 mg total) by mouth daily. Patient not taking: Reported on 02/23/2019  02/11/19   [name], DO    Family History Family History  Problem Relation Age of Onset   Cancer Mother        BREAST(BONE)   Cancer Father        PANCREATIC   Hypertension Maternal Grandmother    Stroke Maternal Aunt    Heart attack Neg Hx     Social History Social History   Tobacco Use   Smoking status: Current Every Gras Smoker    Packs/Beeck: 1.00    Years: 40.00    Pack years: 40.00    Types: Cigarettes, E-cigarettes   Smokeless tobacco: Former Systems developer    Quit date: 1985   Tobacco comment: vapes all [place]  Substance Use Topics   Alcohol use: Yes    Alcohol/week: 0.0 standard drinks    Comment: 1-2 bottles of red wine a [place]   Drug use: No     Allergies   Other   Review of Systems Review of Systems  Constitutional: Negative for chills, diaphoresis, fatigue, fever and unexpected weight change.  HENT: Negative for congestion, ear pain, facial swelling, hearing loss, rhinorrhea, sore throat and  trouble swallowing.        Pt reports ear fullness.  Eyes: Negative for visual disturbance.  Respiratory: Positive for shortness of breath. Negative for cough, chest tightness, wheezing and stridor.   Cardiovascular: Positive for palpitations. Negative for chest pain and leg swelling.  Gastrointestinal: Negative for abdominal pain, blood in stool, diarrhea, nausea and vomiting.  Endocrine: Negative for cold intolerance and heat intolerance.  Skin: Negative for pallor and rash.  Allergic/Immunologic: Negative for environmental allergies, food allergies and immunocompromised state.  Neurological: Negative for dizziness, syncope, speech difficulty, weakness, light-headedness and numbness.  Psychiatric/Behavioral: The patient is not nervous/anxious.     Physical Exam Updated Vital Signs BP 127/86 (BP Location: Right Arm)    Pulse 93    Temp 98.2 F (36.8 C) (Oral)    Resp 18    Ht 5\' 9"  (1.753 m)    Wt 86.2 kg    SpO2 100%    BMI 28.06 kg/m   Physical  Exam Vitals signs and nursing note reviewed.  Constitutional:      General: He is not in acute distress.    Appearance: He is well-developed. He is not diaphoretic.  HENT:     Head: Normocephalic and atraumatic.     Right Ear: Tympanic membrane, ear canal and external ear normal.     Left Ear: There is impacted cerumen.     Nose: Nose normal. No congestion or rhinorrhea.     Mouth/Throat:     Mouth: Mucous membranes are moist.     Pharynx: No posterior oropharyngeal erythema.  Eyes:     Extraocular Movements: Extraocular movements intact.     Conjunctiva/sclera: Conjunctivae normal.     Pupils: Pupils are equal, round, and reactive to light.  Neck:     Musculoskeletal: Normal range of motion.  Cardiovascular:     Rate and Rhythm: Normal rate and regular rhythm.     Heart sounds: Normal heart sounds. No murmur. No friction rub. No gallop.   Pulmonary:     Effort: Pulmonary effort is normal. No respiratory distress.     Breath sounds: Normal breath sounds. No wheezing or rales.  Abdominal:     Palpations: Abdomen is soft.     Tenderness: There is no abdominal tenderness.  Musculoskeletal:     Comments: Amputation of left great toe noted.  Skin:    General: Skin is warm.     Findings: No erythema or rash.  Neurological:     Mental Status: He is alert.    ED Treatments / Results  Labs (all labs ordered are listed, but only abnormal results are displayed) Labs Reviewed  CBC WITH DIFFERENTIAL/PLATELET - Abnormal; Notable for the following components:      Result Value   WBC 3.0 (*)    Hemoglobin 12.6 (*)    MCV 77.1 (*)    MCH 24.4 (*)    RDW 16.3 (*)    nRBC 1.0 (*)    Lymphs Abs 0.5 (*)    All other components within normal limits  COMPREHENSIVE METABOLIC PANEL - Abnormal; Notable for the following components:   Glucose, Bld 125 (*)    All other components within normal limits  PROTIME-INR - Abnormal; Notable for the following components:   Prothrombin Time 20.9 (*)     INR 1.8 (*)    All other components within normal limits  I-STAT TROPONIN, ED    EKG EKG Interpretation  Date/Time:  Friday February 23 2019 18:05:21 EDT Ventricular Rate:  89 PR Interval:    QRS Duration: 128 QT Interval:  415 QTC Calculation: 505 R Axis:   -59 Text Interpretation:  Sinus rhythm RBBB and LAFB Borderline ST elevation, lateral leads Since last tracing rate slower Otherwise no significant change Confirmed by Deno Etienne [phone]) on 02/23/2019 6:10:57 PM   Radiology Dg Chest 2 View  Result Date: 02/23/2019 CLINICAL DATA:  Shortness of breath EXAM: CHEST - 2 VIEW COMPARISON:  12/07/2018 FINDINGS: Bilateral interstitial thickening. No focal consolidation. No pleural effusion or pneumothorax. Stable cardiomediastinal silhouette. Prior mitral valve replacement. No acute osseous abnormality. IMPRESSION: Mild bilateral chronic interstitial disease. No acute cardiopulmonary disease. Electronically Signed   By: Kathreen Devoid   On: 02/23/2019 19:47   Ct Angio Chest Pe W/cm &/or Wo Cm  Result Date: 02/23/2019 CLINICAL DATA:  Shortness of breath.  History of pulmonary embolus. EXAM: CT ANGIOGRAPHY CHEST WITH CONTRAST TECHNIQUE: Multidetector CT imaging of the chest was performed using the standard protocol during bolus administration of intravenous contrast. Multiplanar CT image reconstructions and MIPs were obtained to evaluate the vascular anatomy. CONTRAST:  35mL OMNIPAQUE IOHEXOL 350 MG/ML SOLN COMPARISON:  06/05/2018 FINDINGS: Cardiovascular: Heart size upper normal. No substantial pericardial effusion. No thoracic aortic aneurysm. No filling defect within the opacified pulmonary arteries to suggest the presence of an acute pulmonary embolus. Mediastinum/Nodes: Stable 14 mm precarinal lymph node. No change 13 mm AP window lymph node. 10 mm short axis prevascular lymph node is unchanged. 14 mm short axis right hilar lymph node was 15 mm previously. The esophagus has normal imaging  features. There is no axillary lymphadenopathy. Lungs/Pleura: Innumerable tiny bilateral pulmonary nodules involving all lobes of both lungs are stable since 03/21/2017. 15 mm calcified granuloma noted at the right lung base, as before. No pleural effusion. No focal airspace consolidation. No pulmonary edema. Upper Abdomen: Multiple low-density liver lesions measure water attenuation and are stable since a CT of 03/21/2017, compatible with cyst. Musculoskeletal: No worrisome lytic or sclerotic osseous abnormality. Review of the MIP images confirms the above findings. IMPRESSION: 1. No CT evidence for acute pulmonary embolus. 2. Stable appearance of the innumerable bilateral tiny pulmonary nodules. Given at least 2 years of imaging stability, imaging features most compatible with benign etiology. 3. Stable mediastinal and hilar lymphadenopathy. Electronically Signed   By: Misty Stanley M.D.   On: 02/23/2019 21:42    Procedures Procedures (including critical care time)  Medications Ordered in ED Medications  iohexol (OMNIPAQUE) 350 MG/ML injection 75 mL (75 mLs Intravenous Contrast Given 02/23/19 2117)     Initial Impression / Assessment and Plan / ED Course  I have reviewed the triage vital signs and the nursing notes.  Pertinent labs & imaging results that were available during my care of the patient were reviewed by me and considered in my medical decision making (see chart for details).  Clinical Course as of Feb 22 2253  Fri Feb 23, 2019  2006 Mild bilateral chronic interstitial disease. No acute cardiopulmonary disease.    DG Chest 2 View [AH]  2252 No CT evidence for acute pulmonary embolus. Stable appearance of the innumerable bilateral tiny pulmonary nodules. Given at least 2 years of imaging stability, imaging features most compatible with benign etiology. Stable mediastinal and hilar lymphadenopathy. Discussed findings with patient and advised patient to follow up with PCP.     CT  Angio Chest PE W/Cm &/Or Wo Cm [AH]    Clinical Course User Index [AH] Arville Lime, PA-C  Patient presents with palpitations and shortness of breath. Patient is to be discharged with recommendation to follow up with PCP in regards to today's hospital visit. Palpitations and shortness of breath are not likely of cardiac or pulmonary etiology d/t presentation, CTA is negative for PE, VSS, no tracheal deviation, no JVD or new murmur, RRR, breath sounds equal bilaterally, EKG without acute abnormalities, negative troponin, and negative CXR. Suspect palpitations and shortness of breath were due to anxiety. Patient has been asymptomatic while in the ER. Pt has been advised to return to the ED if symptoms worsen or becomes concerning in any way. Cerumen impaction was removed and patient states ear fullness has completely resolved. Pt appears reliable for follow up and is agreeable to discharge.   Final Clinical Impressions(s) / ED Diagnoses   Final diagnoses:  Impacted cerumen of left ear  Palpitations  Shortness of breath    ED Discharge Orders    None       Arville Lime, PA-C 02/23/19 [place], [place], DO 02/24/19 1104

## 2019-02-23 NOTE — ED Triage Notes (Signed)
States he was seen at urgent care for fullness in left ear, onset long time ago, states while he was up there his heart started racing. Heart NSR at present.

## 2019-02-23 NOTE — ED Notes (Signed)
Per Maudie Mercury NP, pt needs eval in ER, will wheel him down.

## 2019-02-23 NOTE — ED Notes (Signed)
Wasunable to palpate pulse or get a reading for level on monitor. Attached EKG leads which showed HR of 145. EKG given to Lavell Anchors, NP, who is with the patient now.

## 2019-02-23 NOTE — ED Provider Notes (Signed)
  [facility]    CSN: [id number] Arrival date & time: 02/23/19  1411     History   Chief Complaint Chief Complaint  Patient presents with  . Ear Problem    HPI Trea Latner Fissel is a 58 y.o. male.   HPI  Patient is being directed to the ER for further evaluation of SVT and shortness of breath. Current heart rate is 146 bpm.  He is stable and currently being transported by RN to [facility] ER.  Molli Barrows, FNP    Scot Jun, [place] 02/23/19 [phone]

## 2019-02-23 NOTE — ED Notes (Signed)
Patient transported to CT 

## 2019-02-23 NOTE — Discharge Instructions (Signed)
You have been seen today for ear fullness, racing heart, and shortness of breath. Please read and follow all provided instructions.   1. Medications: usual home medications 2. Treatment: rest, drink plenty of fluids 3. Follow Up: Please follow up with your primary doctor in 2 days for discussion of your diagnoses and further evaluation after today's visit; if you do not have a primary care doctor use the resource guide provided to find one; Please return to the ER for any new or worsening symptoms. Please obtain all of your results from medical records or have your doctors office obtain the results - share them with your doctor - you should be seen at your doctors office. Call today to arrange your follow up.   Take medications as prescribed. Please review all of the medicines and only take them if you do not have an allergy to them. Return to the emergency room for worsening condition or new concerning symptoms. Follow up with your regular doctor. If you don't have a regular doctor use one of the numbers below to establish a primary care doctor.  Please be aware that if you are taking birth control pills, taking other prescriptions, ESPECIALLY ANTIBIOTICS may make the birth control ineffective - if this is the case, either do not engage in sexual activity or use alternative methods of birth control such as condoms until you have finished the medicine and your family doctor says it is OK to restart them. If you are on a blood thinner such as COUMADIN, be aware that any other medicine that you take may cause the coumadin to either work too much, or not enough - you should have your coumadin level rechecked in next 7 days if this is the case.  ?  It is also a possibility that you have an allergic reaction to any of the medicines that you have been prescribed - Everybody reacts differently to medications and while MOST people have no trouble with most medicines, you may have a reaction such as nausea,  vomiting, rash, swelling, shortness of breath. If this is the case, please stop taking the medicine immediately and contact your physician.  ?  You should return to the ER if you develop severe or worsening symptoms.   Emergency Department Resource Guide 1) Find a Doctor and Pay Out of Pocket Although you won't have to find out who is covered by your insurance plan, it is a good idea to ask around and get recommendations. You will then need to call the office and see if the doctor you have chosen will accept you as a new patient and what types of options they offer for patients who are self-pay. Some doctors offer discounts or will set up payment plans for their patients who do not have insurance, but you will need to ask so you aren't surprised when you get to your appointment.  2) Contact Your Local Health Department Not all health departments have doctors that can see patients for sick visits, but many do, so it is worth a call to see if yours does. If you don't know where your local health department is, you can check in your phone book. The CDC also has a tool to help you locate your state's health department, and many state websites also have listings of all of their local health departments.  3) Find a [facility] If your illness is not likely to be very severe or complicated, you may want to try a walk in clinic.  These are popping up all over the country in pharmacies, drugstores, and shopping centers. They're usually staffed by nurse practitioners or physician assistants that have been trained to treat common illnesses and complaints. They're usually fairly quick and inexpensive. However, if you have serious medical issues or chronic medical problems, these are probably not your best option.  No Primary Care Doctor: Call Health Connect at  [phone] - they can help you locate a primary care doctor that  accepts your insurance, provides certain services, etc. Physician Referral Service-  [phone]  Chronic Pain Problems: Organization         Address  Phone   Notes  [facility]  [phone] Patients need to be referred by their primary care doctor.   Medication Assistance: Organization         Address  Phone   Notes  [facility] Medication [facility] [address] [place], [place] [address] [phone] --Must be a resident of [facility] -- Must have NO insurance coverage whatsoever (no Medicaid/ Medicare, etc.) -- The pt. MUST have a primary care doctor that directs their care regularly and follows them in the community   [facility]  [phone]   [employer]  [phone]    Agencies that provide inexpensive medical care: Organization         Address  Phone   Notes  [place]  [phone]   [facility]    [phone]   [facility] [place], [place] [address] [phone]   [place] [address] [place] [phone]   [facility]    [phone]   [facility]    [phone]   [place] and [place] [address], [place] Phone:  [phone], Fax:  [phone] Hours of Operation:  9 am - 6 pm, M-F.  Also accepts Medicaid/Medicare and self-pay.  [facility] for [place] [address], [address], [place] Phone: [phone], Fax: [phone]. Hours of Operation:  8:30 am - 5:30 pm, M-F.  Also accepts Medicaid and self-pay.  [facility] [address], [place] Phone: [phone]   [place], [place], [place] [phone], Ext. [phone] Mondays & Thursdays: 7-9 AM.  First 15 patients are seen on a first come, first serve basis.    [place] Providers:  Organization         Address  Phone   Notes  [facility] [address],  [place] [phone] Also accepts self-pay patients.  [facility] [address], [place]  [phone]   [place], [address], [place] [phone]   [facility] [address], [place] [phone]   Lucianne Lei [address], [place]   [phone] Only accepts [place] [facility] [place] patients after they have their name applied to their card.   Self-Pay (no insurance) in [facility]:  Organization         Address  Phone   Notes  Sickle Cell Patients, [facility] [place] [phone]   [facility] [place] [phone]   [facility] [place]  [place], [address], [place] [phone]   [facility]/Dr. Osei-Bonsu  [address] [place] or [address], [place] [phone] Phone number for both [place] and [place] locations is the same.  Urgent Medical and [facility] [address] [place] [phone]   [facility] [address] [place] or [address] [phone]   [facility] [address], [place] [phone], phone; [phone], fax Sees patients 1st and 3rd Saturday of every month.  Must not qualify for public or private insurance (i.e. Medicaid, Medicare, [place] Health Choice, Veterans' Benefits)  Household income should be no more than 200% of the poverty level The clinic cannot treat you if you are pregnant or think you are pregnant  Sexually transmitted diseases are not treated at the clinic.

## 2019-02-23 NOTE — ED Triage Notes (Signed)
Pt c/o L ear problem, states hes been trying to use drops. Problem for several weeks.

## 2019-02-27 ENCOUNTER — Encounter: Payer: Self-pay | Admitting: Pharmacist

## 2019-02-28 ENCOUNTER — Encounter: Payer: Self-pay | Admitting: Pharmacist

## 2019-03-01 ENCOUNTER — Encounter: Payer: Self-pay | Admitting: Pharmacist

## 2019-03-02 ENCOUNTER — Ambulatory Visit (INDEPENDENT_AMBULATORY_CARE_PROVIDER_SITE_OTHER): Payer: Self-pay | Admitting: Physician Assistant

## 2019-03-05 ENCOUNTER — Encounter: Payer: Self-pay | Admitting: Pharmacist

## 2019-03-06 ENCOUNTER — Ambulatory Visit: Payer: Self-pay | Attending: Nurse Practitioner | Admitting: Nurse Practitioner

## 2019-03-06 ENCOUNTER — Other Ambulatory Visit: Payer: Self-pay

## 2019-03-06 ENCOUNTER — Encounter: Payer: Self-pay | Admitting: Nurse Practitioner

## 2019-03-06 ENCOUNTER — Encounter: Payer: Self-pay | Admitting: Pharmacist

## 2019-03-06 DIAGNOSIS — Z1211 Encounter for screening for malignant neoplasm of colon: Secondary | ICD-10-CM

## 2019-03-06 DIAGNOSIS — D689 Coagulation defect, unspecified: Secondary | ICD-10-CM | POA: Insufficient documentation

## 2019-03-06 DIAGNOSIS — I471 Supraventricular tachycardia: Secondary | ICD-10-CM

## 2019-03-06 DIAGNOSIS — F10239 Alcohol dependence with withdrawal, unspecified: Secondary | ICD-10-CM

## 2019-03-06 DIAGNOSIS — F10939 Alcohol use, unspecified with withdrawal, unspecified: Secondary | ICD-10-CM

## 2019-03-06 DIAGNOSIS — I5042 Chronic combined systolic (congestive) and diastolic (congestive) heart failure: Secondary | ICD-10-CM | POA: Insufficient documentation

## 2019-03-06 MED ORDER — METOPROLOL SUCCINATE ER 50 MG PO TB24: 50.0000 mg | ORAL_TABLET | Freq: Every day | ORAL | 3 refills | Status: DC

## 2019-03-06 NOTE — Patient Instructions (Signed)
Behavioral Health Resources:  ? ?What if I or someone I know is in crisis? ? ?If you are thinking about harming yourself or having thoughts of suicide, or if you know someone who is, seek help right away. ? ?Call your doctor or mental health care provider. ? ?Call 911 or go to a hospital emergency room to get immediate help, or ask a friend or family member to help you do these things. ? ?Call the [place] National Suicide Prevention Lifeline?s toll-free, 24-hour hotline at 1-800-273-TALK (1-800-273-8255) or TTY: 1-800-799-4 TTY (1-800-799-4889) to talk to a trained counselor. ? ?If you are in crisis, make sure you are not left alone.  ? ?If someone else is in crisis, make sure he or she is not left alone ? ? ?24 Hour Availability ? ?[facility]  ?[address], [place], [place] [address]  ?336-832-9700 or 1-800-711-2635 ? ?[facility] Crisis Line ?(Domestic Violence, Rape & Victim Assistance ?336-273-7273 ? ?[facility]  ?[address] [place], [place]  [address]               1-855-788-8787 or 336-676-6840 ? ?[facility] [place] [facility]    ?(ONLY from 8am-4pm)    ?336-899-1505 ? ?[facility] (24/7)   ?1-877-626-1772 ? ?[place] National Suicide Hotline   ?1-800-273-8255 (TALK) ? ?Support from local police to aid getting patient to hospital (http://www.-[place].gov/index.aspx?page=2797) ? ? ?      ?ONGOING BEHAVIORAL HEALTH SUPPORT FOR UNINSURED and UNDERINSURED:  ?[facility]  ?336-676-6840  ?[address]  ?Walk-in first time, Monday-Friday, 8:30am-5:00pm  ?*Bring snack, drink, something to do, long wait at first visit, they do have pharmacy for behavioral health medications/ Bring own interpreter at first visit, if needed ?[facility]  ?336-387-6161  ?[address]  ?Walk-in Monday-Friday, 8:30am-12pm & 1-2:30pm  ?*pacientes que hablen espanol, favor comunicarse con el Sr.  M[place]ragon, extension 2244 o la [address], extension 3331 para hacer una cita  ?[facility]:  ?336-429-5600 or kellinfoundation@gmail.com  ?[address], [address]  ?Call or email, may self-refer  ?* uninsured/underinsured, 19-64yo, have both mental health and substance use challenges  ?[facility]:  ?Phone (336) [phone]; Fax [phone]  ?*Call to schedule an appointment  ?3rd Floor located @?[address]  ?Mon-Thursday: 8:30am-8:00pm Friday: 8:30am-7:00pm  ?* Be sure to park in a space labeled ?Psychology Department,? located to the right of the main door of the building. Enter the main doors facing the parking lot and take the elevator or stairs to the 3rd Floor.  ?[facility]:  ?336-832-9700 or  ?1-800-711-2635 (24/hour helpline)  ?[address]  ?Call to make appointment, tends to be a long wait to begin services, depending on insurance  ?[facility]  ?(336) 333-6860 ??  ?*Call to schedule an appointment  ?[address]  ?Monday-Friday, 8:00am-5:00pm  ?[facility]  ?([phone]  ?[address], [place]  ?Monday-Friday, walk-in 8am-3pm  ?First appointment is assessment, then will make appointment for psychiatry   ?  ?

## 2019-03-06 NOTE — Progress Notes (Signed)
Virtual Visit via Telephone Note Due to national recommendations of social distancing due to [place] 19, telehealth visit is felt to be most appropriate for this patient at this time.  I discussed the limitations, risks, security and privacy concerns of performing an evaluation and management service by telephone and the availability of in person appointments. I also discussed with the patient that there may be a patient responsible charge related to this service. The patient expressed understanding and agreed to proceed.    I connected with Leonard Bentley on 03/06/19  at   4:10 PM EDT  EDT by telephone and verified that I am speaking with the correct person using two identifiers.   Consent I discussed the limitations, risks, security and privacy concerns of performing an evaluation and management service by telephone and the availability of in person appointments. I also discussed with the patient that there may be a patient responsible charge related to this service. The patient expressed understanding and agreed to proceed.   Location of Patient: Private  Residence   Location of Provider: [place] [facility] [employer] Office    Persons participating in Telemedicine visit: [name] FNP-BC Biggers Sada    History of Present Illness: Telemedicine visit for: Follow up  Leonard Bentley a 58 y.o.malewith medical history significant ofcurrent pulmonary embolism on warfarin, hypertension, tobacco abuse, peripheral vascular disease, non-Hodgkin's lymphoma, MR with MVR, chronic tachycardia and alcoholism. Continues to drink several glasses of wine daily. I have instructed him that it is good he has weaned down the amount of drinks however he need to eliminate alcohol totally. He is being seen at the community clinic to have his INR checked. Sees pharmacist on Tuesday @ 3pm. He needs to be seen by Cardiology due to his chronic tachycardia/SVT. Also needs to see GI for  anemia/colonoscopy. Patient has been advised to apply for financial assistance and schedule to see our financial counselor. He is currently uninsured.   Past Medical History:  Diagnosis Date  . Acute pulmonary embolism ([place]) 04/11/2016  . Anxiety   . Arthritis   . Atrial tachycardia ([place]) 01/06/2016  . Depression   . DTs (delirium tremens) ([place])   . Dyspnea   . ED (erectile dysfunction)   . ETOH abuse   . Hypertension   . Lymphoma, non Hodgkin's 09/25/2011   Stage 2  . Mitral regurgitation 01/20/2016  . Mobitz I 10/09/2015  . Murmur 01/06/2016  . Occasional tremors   . PAC (premature atrial contraction) 10/09/2015  . S/P minimally-invasive mitral valve repair 10/27/2016   Complex valvuloplasty including artificial Gore-tex neochord placement x6 and 34 mm Edwards Physio II ring annuloplasty via right mini thoracotomy approach    Past Surgical History:  Procedure Laterality Date  . AMPUTATION Left 01/12/2019   Procedure: LEFT GREAT TOE AMPUTATION;  Surgeon: [name], MD;  Location: [facility];  Service: Orthopedics;  Laterality: Left;  . CARDIAC CATHETERIZATION N/A 08/13/2016   Procedure: Right/Left Heart Cath and Coronary Angiography;  Surgeon: [name], MD;  Location: [facility];  Service: Cardiovascular;  Laterality: N/A;  . MITRAL VALVE REPAIR Right 10/27/2016   Procedure: MINIMALLY INVASIVE MITRAL VALVE REPAIR (MVR) USING 34 PHYSIO II ANNULOPLASTY RING;  Surgeon: [name], MD;  Location: [facility];  Service: Open Heart Surgery;  Laterality: Right;  . SKIN BIOPSY Right 04/04/2018   right mid medial anterior tibial shave  see report in chart  . TEE WITHOUT CARDIOVERSION N/A  02/11/2016   Procedure: TRANSESOPHAGEAL ECHOCARDIOGRAM (TEE);  Surgeon: [name], MD;  Location: [facility];  Service: Cardiovascular;  Laterality: N/A;  . TEE WITHOUT CARDIOVERSION N/A 10/27/2016   Procedure: TRANSESOPHAGEAL ECHOCARDIOGRAM (TEE);  Surgeon: [name], MD;  Location:  [facility];  Service: Open Heart Surgery;  Laterality: N/A;  . TRIGGER FINGER RELEASE Bilateral     Family History  Problem Relation Age of Onset  . Cancer Mother        BREAST(BONE)  . Cancer Father        PANCREATIC  . Hypertension Maternal Grandmother   . Stroke Maternal Aunt   . Heart attack Neg Hx     Social History   Socioeconomic History  . Marital status: Single    Spouse name: Not on file  . Number of children: Not on file  . Years of education: Not on file  . Highest education level: Not on file  Occupational History  . Occupation: unemployed  Social Needs  . Financial resource strain: Not on file  . Food insecurity:    Worry: Not on file    Inability: Not on file  . Transportation needs:    Medical: Not on file    Non-medical: Not on file  Tobacco Use  . Smoking status: Current Every Stay Smoker    Packs/[place]: 1.00    Years: 40.00    Pack years: 40.00    Types: Cigarettes, E-cigarettes  . Smokeless tobacco: Former Systems developer    Quit date: 1985  . Tobacco comment: vapes all S[place]  Substance and Sexual Activity  . Alcohol use: Yes    Alcohol/week: 0.0 standard drinks    Comment: 1-2 bottles of red wine a [place]  . Drug use: No  . Sexual activity: Not Currently  Lifestyle  . Physical activity:    Days per week: Not on file    Minutes per session: Not on file  . Stress: Not on file  Relationships  . Social connections:    Talks on phone: Not on file    Gets together: Not on file    Attends religious service: Not on file    Active member of club or organization: Not on file    Attends meetings of clubs or organizations: Not on file    Relationship status: Not on file  Other Topics Concern  . Not on file  Social History Narrative   Epworth sleepiness scale as of 10/09/15 a 1     Observations/Objective: Awake, alert and oriented x 3   Review of Systems  Constitutional: Negative for fever, malaise/fatigue and weight loss.  HENT: Negative.  Negative for  nosebleeds.   Eyes: Negative.  Negative for blurred vision, double vision and photophobia.  Respiratory: Negative.  Negative for cough and shortness of breath.   Cardiovascular: Negative.  Negative for chest pain and leg swelling.       Denies palpitations  Gastrointestinal: Negative.  Negative for heartburn, nausea and vomiting.  Musculoskeletal: Negative.  Negative for myalgias.  Neurological: Negative.  Negative for dizziness, focal weakness, seizures and headaches.  Psychiatric/Behavioral: Positive for substance abuse. Negative for suicidal ideas.    Assessment and Plan: Patricia was seen today for hospitalization follow-up.  Diagnoses and all orders for this visit:  SVT (supraventricular tachycardia) (HCC) -     metoprolol succinate (TOPROL-XL) 50 MG 24 hr tablet; Take 1 tablet (50 mg total) by mouth daily. Take with or immediately following a meal. -     Ambulatory  referral to Cardiology  Coagulation disorder ([place]) Continue Warfarin. Scheduled for PT/INR visit this week  Chronic combined systolic and diastolic heart failure ([place]) -     Ambulatory referral to Cardiology  Colon cancer screening -     Ambulatory referral to Gastroenterology  Alcohol withdrawal syndrome with complication ([place]) -     thiamine 100 MG tablet; Take 1 tablet (100 mg total) by mouth daily. Declines AA or ETOH abuse resources   Follow Up Instructions Return in about 3 months (around 06/05/2019).     I discussed the assessment and treatment plan with the patient. The patient was provided an opportunity to ask questions and all were answered. The patient agreed with the plan and demonstrated an understanding of the instructions.   The patient was advised to call back or seek an in-person evaluation if the symptoms worsen or if the condition fails to improve as anticipated.  I provided 24 minutes of non-face-to-face time during this encounter including median intraservice time, reviewing previous notes,  labs, imaging, medications and explaining diagnosis and management.  [name], FNP-BC

## 2019-03-07 ENCOUNTER — Ambulatory Visit: Payer: Self-pay | Attending: Nurse Practitioner | Admitting: Pharmacist

## 2019-03-07 ENCOUNTER — Other Ambulatory Visit: Payer: Self-pay

## 2019-03-07 DIAGNOSIS — Z86711 Personal history of pulmonary embolism: Secondary | ICD-10-CM

## 2019-03-07 DIAGNOSIS — Z9889 Other specified postprocedural states: Secondary | ICD-10-CM

## 2019-03-07 LAB — POCT INR: INR: 2.2 (ref 2.0–3.0)

## 2019-03-08 ENCOUNTER — Ambulatory Visit (INDEPENDENT_AMBULATORY_CARE_PROVIDER_SITE_OTHER): Payer: Self-pay | Admitting: Orthopedic Surgery

## 2019-03-09 ENCOUNTER — Encounter: Payer: Self-pay | Admitting: Nurse Practitioner

## 2019-03-09 ENCOUNTER — Telehealth: Payer: Self-pay | Admitting: Orthopedic Surgery

## 2019-03-09 MED ORDER — THIAMINE HCL 100 MG PO TABS: 100.0000 mg | ORAL_TABLET | Freq: Every day | ORAL | 1 refills | Status: AC

## 2019-03-09 NOTE — Telephone Encounter (Signed)
Ok to rf? 

## 2019-03-09 NOTE — Telephone Encounter (Signed)
Pt called in said if he could get a refill on his medication for doxycycline 100 mg

## 2019-03-11 NOTE — Telephone Encounter (Signed)
Lets see him in the office to look at his foot

## 2019-03-12 NOTE — Telephone Encounter (Signed)
Called and lm on vm to advise pt that per Dr. [name] he would like for the pt to  Come in for follow up prior to refill of ABX to evaluate the need for refill. To call and make appt.

## 2019-03-13 NOTE — Telephone Encounter (Signed)
Pt has an appt on 03/15/19

## 2019-03-14 ENCOUNTER — Telehealth: Payer: Self-pay | Admitting: Orthopedic Surgery

## 2019-03-14 ENCOUNTER — Other Ambulatory Visit (INDEPENDENT_AMBULATORY_CARE_PROVIDER_SITE_OTHER): Payer: Self-pay | Admitting: Physician Assistant

## 2019-03-14 DIAGNOSIS — Z89412 Acquired absence of left great toe: Secondary | ICD-10-CM

## 2019-03-14 NOTE — Telephone Encounter (Signed)
Patient called left voicemail message needing Rx refilled for his antibiotic. Patient said he think the Rx will be for the Doxycycline. The number to contact patient is [phone]

## 2019-03-14 NOTE — Telephone Encounter (Signed)
I called and lm on vm to advise that we had called and left several messages to advise Dr. [name] wants to see him in the office first. Pt has an appt sch for tomorrow at 3:30 to come in for eval and can decide if refill is needed at that time.

## 2019-03-15 ENCOUNTER — Other Ambulatory Visit: Payer: Self-pay

## 2019-03-15 ENCOUNTER — Ambulatory Visit (INDEPENDENT_AMBULATORY_CARE_PROVIDER_SITE_OTHER): Payer: Self-pay | Admitting: Orthopedic Surgery

## 2019-03-15 ENCOUNTER — Encounter: Payer: Self-pay | Admitting: Orthopedic Surgery

## 2019-03-15 VITALS — Ht 69.0 in | Wt 190.0 lb

## 2019-03-15 DIAGNOSIS — S90221A Contusion of right lesser toe(s) with damage to nail, initial encounter: Secondary | ICD-10-CM

## 2019-03-15 DIAGNOSIS — Z89412 Acquired absence of left great toe: Secondary | ICD-10-CM

## 2019-03-15 DIAGNOSIS — L97511 Non-pressure chronic ulcer of other part of right foot limited to breakdown of skin: Secondary | ICD-10-CM

## 2019-03-18 ENCOUNTER — Encounter: Payer: Self-pay | Admitting: Orthopedic Surgery

## 2019-03-18 NOTE — Progress Notes (Signed)
Office Visit Note   Patient: Leonard Bentley           Date of Birth: 02-08-1961           MRN: [id number] Visit Date: 03/15/2019              Requested by: [name], [place], [place] [address] PCP: [name], NP  Chief Complaint  Patient presents with   Left Foot - Routine Post Op    01/12/19 left foot great toe amp      HPI: The patient is a 58 year old gentleman who is seen for postoperative follow-up following left great toe amputation on 01/12/2019.  He developed a small ulcer over the right lateral foot and had been using Iodosorb ointment to both areas daily.  He completed a course of doxycycline.  He has been weightbearing as tolerated in a postop shoe.  He ambulates with a cane.  He is also been concerned about a dark toenail on his right second toe.  Assessment & Plan: Visit Diagnoses:  1. Status post amputation of left great toe ([place])   2. Skin ulcer of foot, limited to breakdown of skin, right (HCC)   3. Subungual hematoma of second toe of right foot, initial encounter     Plan: Counseled the patient to continue a small amount of Iodosorb to the left great toe small open area daily and then apply his medical compression socks and wear these around-the-clock except for showering.  We will continue to monitor the right second toe dark toenail, the band of dark nail does appear to be growing out as the nail grows out and is reassuring that this is likely just a old subungual hematoma.  Do not feel that the patient currently requires antibiotics.  He will follow-up here in 4 weeks or sooner should he develop difficulties in the interim.  Follow-Up Instructions: Return in about 4 weeks (around 04/12/2019).   Ortho Exam  Patient is alert, oriented, no adenopathy, well-dressed, normal affect, normal respiratory effort. The left great toe operative site has a very small 2 to 3 mm area which is open but does not appear to track deeply.  There is no  visible bone or tendon.  There is no signs of cellulitis or infection.  The right great toe and fifth toe plantar surface has calluses but no areas of ulceration currently.  The right second toenail has a dark band which appears to be growing out with the toenail but we will continue to monitor this.  He has palpable pedal pulses bilaterally and again no signs of infection or cellulitis currently.  Imaging: No results found.   Labs: Lab Results  Component Value Date   HGBA1C 5.2 10/25/2016   HGBA1C 6.1 (H) 04/03/2016   HGBA1C 5.9 (H) 08/18/2015   LABURIC 5.2 12/09/2018   LABURIC 7.4 08/18/2015   LABURIC 4.1 12/01/2011   REPTSTATUS 01/14/2019 FINAL 01/09/2019   CULT  01/09/2019    NO GROWTH 5 DAYS Performed at [facility], [place] [address] [place], [place] [address]      Lab Results  Component Value Date   ALBUMIN 3.8 02/23/2019   ALBUMIN 3.7 02/09/2019   ALBUMIN 3.2 (L) 01/10/2019   LABURIC 5.2 12/09/2018   LABURIC 7.4 08/18/2015   LABURIC 4.1 12/01/2011    Body mass index is 28.06 kg/m.  Orders:  No orders of the defined types were placed in this encounter.  No orders  of the defined types were placed in this encounter.    Procedures: No procedures performed  Clinical Data: No additional findings.  ROS:  All other systems negative, except as noted in the HPI. Review of Systems  Objective: Vital Signs: Ht 5\' 9"  (1.753 m)    Wt 190 lb (86.2 kg)    BMI 28.06 kg/m   Specialty Comments:  No specialty comments available.  PMFS History: Patient Active Problem List   Diagnosis Date Noted   Chronic combined systolic and diastolic heart failure ([place]) 03/06/2019   Coagulation disorder ([place]) 03/06/2019   Impacted cerumen of both ears 02/09/2019   Osteomyelitis of great toe of left foot ([place]) 01/09/2019   Foot ulcer, left ([place]) 12/27/2018   Cellulitis 12/07/2018   Bleeding from left ear 11/28/2018   HTN (hypertension) 11/02/2018   UTI (urinary  tract infection) 11/02/2018   Ecchymosis    Tachyarrhythmia 08/30/2018   Hypomagnesemia 08/30/2018   Recurrent pulmonary emboli (HCC)    Hematuria    Delirium tremens ([place])    LFTs abnormal    Alcoholic hepatitis without ascites    Alcohol withdrawal ([place]) 06/04/2018   Dizziness    Posterior tibial tendon dysfunction 02/21/2018   Supratherapeutic INR 11/27/2016   Dehydration 11/27/2016   SVT (supraventricular tachycardia) (HCC)    S/P minimally-invasive mitral valve repair 10/27/2016   History of pulmonary embolus ([facility]) 04/11/2016   Left sided numbness    Anxiety 04/02/2016   Panic attacks 04/02/2016   Mitral valve prolapse    Mitral regurgitation 01/20/2016   Atrial tachycardia ([place]) 01/06/2016   Murmur 01/06/2016   Mobitz I 10/09/2015   PAC (premature atrial contraction) 10/09/2015   Arthritis 08/14/2015   Neuropathy due to chemotherapeutic drug ([place]) 03/09/2013   Leucopenia 01/31/2013   Personal history of pulmonary embolism 12/28/2012   Alcohol abuse 08/24/2012   Cigarette smoker 08/24/2012   Hemorrhoid 03/08/2012   Anemia 12/10/2011   History of non-Hodgkin's lymphoma 09/25/2011   Past Medical History:  Diagnosis Date   Acute pulmonary embolism ([place]) 04/11/2016   Anxiety    Arthritis    Atrial tachycardia ([place]) 01/06/2016   Depression    DTs (delirium tremens) ([place])    Dyspnea    ED (erectile dysfunction)    ETOH abuse    Hypertension    Lymphoma, non Hodgkin's 09/25/2011   Stage 2   Mitral regurgitation 01/20/2016   Mobitz I 10/09/2015   Murmur 01/06/2016   Occasional tremors    PAC (premature atrial contraction) 10/09/2015   S/P minimally-invasive mitral valve repair 10/27/2016   Complex valvuloplasty including artificial Gore-tex neochord placement x6 and 34 mm Edwards Physio II ring annuloplasty via right mini thoracotomy approach    Family History  Problem Relation Age of Onset   Cancer Mother         BREAST(BONE)   Cancer Father        PANCREATIC   Hypertension Maternal Grandmother    Stroke Maternal Aunt    Heart attack Neg Hx     Past Surgical History:  Procedure Laterality Date   AMPUTATION Left 01/12/2019   Procedure: LEFT GREAT TOE AMPUTATION;  Surgeon: [name], MD;  Location: [facility];  Service: Orthopedics;  Laterality: Left;   CARDIAC CATHETERIZATION N/A 08/13/2016   Procedure: Right/Left Heart Cath and Coronary Angiography;  Surgeon: [name], MD;  Location: [facility];  Service: Cardiovascular;  Laterality: N/A;   MITRAL VALVE REPAIR Right 10/27/2016   Procedure: MINIMALLY  INVASIVE MITRAL VALVE REPAIR (MVR) USING 34 PHYSIO II ANNULOPLASTY RING;  Surgeon: [name], MD;  Location: [facility];  Service: Open Heart Surgery;  Laterality: Right;   SKIN BIOPSY Right 04/04/2018   right mid medial anterior tibial shave  see report in chart   TEE WITHOUT CARDIOVERSION N/A 02/11/2016   Procedure: TRANSESOPHAGEAL ECHOCARDIOGRAM (TEE);  Surgeon: [name], MD;  Location: [facility];  Service: Cardiovascular;  Laterality: N/A;   TEE WITHOUT CARDIOVERSION N/A 10/27/2016   Procedure: TRANSESOPHAGEAL ECHOCARDIOGRAM (TEE);  Surgeon: [name], MD;  Location: [facility];  Service: Open Heart Surgery;  Laterality: N/A;   TRIGGER FINGER RELEASE Bilateral    Social History   Occupational History   Occupation: unemployed  Tobacco Use   Smoking status: Current Every [place] Smoker    Packs/Simon: 1.00    Years: 40.00    Pack years: 40.00    Types: Cigarettes, E-cigarettes   Smokeless tobacco: Former Systems developer    Quit date: 1985   Tobacco comment: vapes all [place]  Substance and Sexual Activity   Alcohol use: Yes    Alcohol/week: 0.0 standard drinks    Comment: 1-2 bottles of red wine a [place]   Drug use: No   Sexual activity: Not Currently

## 2019-03-30 ENCOUNTER — Telehealth: Payer: Self-pay | Admitting: Cardiovascular Disease

## 2019-03-30 NOTE — Telephone Encounter (Signed)
PHONE ONLY no video pre-reg complete, mychart pending, verbal consent given 03/30/2019 MS

## 2019-04-03 ENCOUNTER — Encounter: Payer: Self-pay | Admitting: Pharmacist

## 2019-04-03 ENCOUNTER — Ambulatory Visit: Payer: Self-pay | Admitting: Nurse Practitioner

## 2019-04-04 ENCOUNTER — Telehealth (INDEPENDENT_AMBULATORY_CARE_PROVIDER_SITE_OTHER): Payer: Self-pay | Admitting: Cardiovascular Disease

## 2019-04-04 VITALS — Ht 69.5 in | Wt 190.0 lb

## 2019-04-04 DIAGNOSIS — I2782 Chronic pulmonary embolism: Secondary | ICD-10-CM

## 2019-04-04 DIAGNOSIS — F1010 Alcohol abuse, uncomplicated: Secondary | ICD-10-CM

## 2019-04-04 DIAGNOSIS — I471 Supraventricular tachycardia: Secondary | ICD-10-CM

## 2019-04-04 DIAGNOSIS — F419 Anxiety disorder, unspecified: Secondary | ICD-10-CM

## 2019-04-04 NOTE — Progress Notes (Signed)
Virtual Visit via Telephone Note   This visit type was conducted due to national recommendations for restrictions regarding the COVID-19 Pandemic (e.g. social distancing) in an effort to limit this patient's exposure and mitigate transmission in our community.  Due to his co-morbid illnesses, this patient is at least at moderate risk for complications without adequate follow up.  This format is felt to be most appropriate for this patient at this time.  The patient did not have access to video technology/had technical difficulties with video requiring transitioning to audio format only (telephone).  All issues noted in this document were discussed and addressed.  No physical exam could be performed with this format.  Please refer to the patient's chart for his  consent to telehealth for [facility].   Date:  04/04/2019   ID:  Leonard Bentley, DOB [birth year], MRN [id number]  Patient Location: Home Provider Location: Home  PCP:  [name], NP  Cardiologist:  [name], MD  Electrophysiologist:  None   Evaluation Performed:  Follow-Up Visit  Chief Complaint:  Follow up  History of Present Illness:    Leonard Bentley is a 58 y.o. male with severe mitral regurgitation s/p mitral valve repair, chronic systolic heart failure LVEF 45-50%, alcohol abuse, tobacco abuse, recurrent PE, and non-Hodgkin's lymphoma s/p chemotherapy who presents for follow up.  Leonard Bentley was initially evaluated 10/2015 after his PCP, [name], noted that he had Mobitz I.  He was referred to cardiology, where he reported that he wasn't able to run as long as he used to.  He was referred for exercise Cardiolite that revealed LVEF 52% and no ischemia. On 01/20/16 he had an echo that was revealed LVEF 60-65% with moderate LVH and posterior mitral valve leaflet prolapse. There was moderate to severe mitral regurgitation.  He was referred for transesophageal echocardiography which confirmed moderate to severe mitral  regurgitation. The left atrium was massively dilated but there was no left ventricle dilation.  Pulmonary pressures were normal.   He was referred to Dr. [name] for consideration of mitral valve replacement/repair.  He had a chest CT on 04/09/16 for preoperative assessment and was noted to have a pulmonary embolism in the right lower lobe.  Leonard Bentley had a PE several years ago in the setting of testosterone therapy.  He was started on Xarelto anticoagulation indefinitely.  He had a follow up CT 07/20/16 that showed no acute thrombus and resolution of most of the prior PE.  There was a minimal, chronic linear thrombus in the RLL.  On 10/27/16 he underwent minimally invasive mitral valve repair with a 34 mm Edwards ring.  Echo 11/28/16 revealed LVEF 45-50% with diffuse hypokinesis and a mildly dilated ascending aorta.  Since the surgery Leonard Bentley was seen in the ED 11/19/16 with an episode of SVT.  He was noted ot have sinus tachycardia vs atrial tachycardia with rates in the 130s that improved with lorazepam.  There was no evidence of heart failure and metoprolol was increased to 50 mg daily.  He followed up with [name], PA-C on 01/07/17 and was noted to be in atrial tachycardia with a heart rate of 141 bpm.  He notes that his breathing has improved.  He is able to run two miles, though at a much slower pace than prior to surgery.  He also does 180 push ups daily.  He hasn't experienced any heart racing.  He has occasional sharp pain at his incision site.  This is worse at rest and never with exertion.  He denies chest pain, shortness of breath, lower extremity edema, orthopnea or PND.  He continues to struggle with anxiety and has not discussed this with his PCP.  Since his last appointment he had his L great toe amputated 01/12/19 2/2 osteomyelitis. No vascular testing was performed at that time as he had excellent peripheral pulses.  He continues to walk with a cane and is frustrated that he can't run.  He walks  two miles daily without exertional chest pain or shortness of breath.  He notes that his stamina isn't as good given that he can't exercise as vigorously anymore. That hospitalization was complicated by alcohol withdrawal and SVT.  He was admitted to the hospital 02/2019 with an episode of SVT.  This occurred in the setting of drinking a bottle of wine.  His heart rate was 134 bpm.  His heart rate improved with Lorazepam and was thought to be triggered by alcohol withdrawal.  He was admitted 08/2018 with chest pain and shortness of breath in the setting of tachycardia to 116.  INR was >10 and LFTs were elevated. EKG showed sinus tachycardia with frequent PVCs and PACs.  Also had SVT.  Echo 08/2018 revealed LVEF 55-60% and mitral valve repair was stable.  His symptoms were thought to be due to EtOH withdrawal.  Leonard Bentley saw his PCP, Ms. Raul Del, who recommended that he restart metoprolol.  He hasn't yet started it due to concern about side effects.  He feels his heart racing when he his anxiouls.  This is typically when he has an upcoming doctor's appointment.  He has limited EtOH to 1-2 glasses of wine nightly.    The patient does not have symptoms concerning for COVID-19 infection (fever, chills, cough, or new shortness of breath).    Past Medical History:  Diagnosis Date   Acute pulmonary embolism ([place]) 04/11/2016   Anxiety    Arthritis    Atrial tachycardia ([place]) 01/06/2016   Depression    DTs (delirium tremens) ([place])    Dyspnea    ED (erectile dysfunction)    ETOH abuse    Hypertension    Lymphoma, non Hodgkin's 09/25/2011   Stage 2   Mitral regurgitation 01/20/2016   Mobitz I 10/09/2015   Murmur 01/06/2016   Occasional tremors    PAC (premature atrial contraction) 10/09/2015   S/P minimally-invasive mitral valve repair 10/27/2016   Complex valvuloplasty including artificial Gore-tex neochord placement x6 and 34 mm Edwards Physio II ring annuloplasty via right mini thoracotomy  approach   Past Surgical History:  Procedure Laterality Date   AMPUTATION Left 01/12/2019   Procedure: LEFT GREAT TOE AMPUTATION;  Surgeon: [name], MD;  Location: [facility];  Service: Orthopedics;  Laterality: Left;   CARDIAC CATHETERIZATION N/A 08/13/2016   Procedure: Right/Left Heart Cath and Coronary Angiography;  Surgeon: [name], MD;  Location: [facility];  Service: Cardiovascular;  Laterality: N/A;   MITRAL VALVE REPAIR Right 10/27/2016   Procedure: MINIMALLY INVASIVE MITRAL VALVE REPAIR (MVR) USING 34 PHYSIO II ANNULOPLASTY RING;  Surgeon: [name], MD;  Location: [facility];  Service: Open Heart Surgery;  Laterality: Right;   SKIN BIOPSY Right 04/04/2018   right mid medial anterior tibial shave  see report in chart   TEE WITHOUT CARDIOVERSION N/A 02/11/2016   Procedure: TRANSESOPHAGEAL ECHOCARDIOGRAM (TEE);  Surgeon: [name], MD;  Location: [facility];  Service: Cardiovascular;  Laterality: N/A;  TEE WITHOUT CARDIOVERSION N/A 10/27/2016   Procedure: TRANSESOPHAGEAL ECHOCARDIOGRAM (TEE);  Surgeon: [name], MD;  Location: [facility];  Service: Open Heart Surgery;  Laterality: N/A;   TRIGGER FINGER RELEASE Bilateral      Current Meds  Medication Sig   acetaminophen (TYLENOL) 650 MG CR tablet Take 1,300 mg by mouth every 8 (eight) hours as needed for pain.    carbamide peroxide (DEBROX) 6.5 % OTIC solution Place 5 drops into the left ear 2 (two) times daily as needed (to cleanse the ear canal).    doxycycline (VIBRAMYCIN) 100 MG capsule Take 1 capsule (100 mg total) by mouth 2 (two) times daily.   metoprolol succinate (TOPROL-XL) 50 MG 24 hr tablet Take 1 tablet (50 mg total) by mouth daily. Take with or immediately following a meal.   oxyCODONE (OXY IR/ROXICODONE) 5 MG immediate release tablet Take 1 tablet (5 mg total) by mouth every 4 (four) hours as needed for severe pain.   thiamine 100 MG tablet Take 1 tablet (100 mg total) by mouth  daily.   warfarin (COUMADIN) 2.5 MG tablet Take 1 tablet daily or as directed by the Coumadin clinic. (Patient taking differently: Take 2.5 mg by mouth every evening. )     Allergies:   Other   Social History   Tobacco Use   Smoking status: Current Every Teed Smoker    Packs/[place]: 1.00    Years: 40.00    Pack years: 40.00    Types: Cigarettes, E-cigarettes   Smokeless tobacco: Former Systems developer    Quit date: 1985   Tobacco comment: vapes all [place]  Substance Use Topics   Alcohol use: Yes    Alcohol/week: 0.0 standard drinks    Comment: 1-2 bottles of red wine a [place]   Drug use: No     Family Hx: The patient's family history includes Cancer in his father and mother; Hypertension in his maternal grandmother; Stroke in his maternal aunt. There is no history of Heart attack.  ROS:   Please see the history of present illness.     All other systems reviewed and are negative.   Prior CV studies:   The following studies were reviewed today:  Echo 08/2018:  Study Conclusions  - Left ventricle: The cavity size was normal. Wall thickness was   normal. Systolic function was normal. The estimated ejection   fraction was in the range of 55% to 60%. Wall motion was normal;   there were no regional wall motion abnormalities. The study is   not technically sufficient to allow evaluation of LV diastolic   function. - Ventricular septum: Septal motion showed paradox. - Mitral valve: Prior procedures included surgical repair. An   annular ring prosthesis was present. Valve area by continuity   equation (using LVOT flow): 3.14 cm^2. - Left atrium: The atrium was mildly dilated. - Pericardium, extracardiac: A trivial pericardial effusion was   identified.  Impressions:  - The proximal left pulmonary artery appears mildly dilated.   Labs/Other Tests and Data Reviewed:    EKG:  No ECG reviewed.  Recent Labs: 12/10/2018: Magnesium 1.6 02/10/2019: TSH 1.203 02/23/2019: ALT 21; BUN  12; Creatinine, Ser 0.95; Hemoglobin 12.6; Platelets 246; Potassium 4.4; Sodium 138   Recent Lipid Panel Lab Results  Component Value Date/Time   CHOL 239 (H) 04/03/2016 03:23 AM   CHOL 238 (H) 08/18/2015 08:25 AM   TRIG 63 04/03/2016 03:23 AM   HDL 126 04/03/2016 03:23 AM   HDL 124 08/18/2015  08:25 AM   CHOLHDL 1.9 04/03/2016 03:23 AM   LDLCALC 100 (H) 04/03/2016 03:23 AM   LDLCALC 94 08/18/2015 08:25 AM    Wt Readings from Last 3 Encounters:  04/04/19 190 lb (86.2 kg)  03/15/19 190 lb (86.2 kg)  02/23/19 190 lb (86.2 kg)     Objective:   Ht 5' 9.5" (1.765 m)    Wt 190 lb (86.2 kg)    BMI 27.66 kg/m  GENERAL: Sounds well.  No acute distress. NEURO:  Speech fluent.  Cranial nerves grossly intact.  Moves all 4 extremities freely PSYCH:  Cognitively intact, oriented to person place and time   ASSESSMENT & PLAN:    # SVT:  # PAT, PACs:  Symptoms are often associated with EtOH withdrawal.  We discussed the importance of continuing to limit and preferably eliminate EtOH.  Agree that he should be back on metoprolol.  He will consider taking it.  We discussed that it may also help with his anxiety.  # Chronic systolic and diastolic heart failure:  LVEF has improved to 55-60%.  He denies heart failure symptoms.   # Pulmonary Embolism: Leonard Bentley had a recurrent PE and will therefore require lifelong anticoagulation. Continue warfarin.   # Mitral Regurgitation s/p repair: Leonard Bentley underwent successful minimally invasive mitral valve repair with Dr. Roxy Manns on 10/27/16.  Postoperative echo showed only trivial mitral regurgitation and no stenosis.   COVID-19 Education: The signs and symptoms of COVID-19 were discussed with the patient and how to seek care for testing (follow up with PCP or arrange E-visit).  The importance of social distancing was discussed today.  Time:   Today, I have spent 15 minutes with the patient with telehealth technology discussing the above problems.      Medication Adjustments/Labs and Tests Ordered: Current medicines are reviewed at length with the patient today.  Concerns regarding medicines are outlined above.   Tests Ordered: No orders of the defined types were placed in this encounter.   Medication Changes: No orders of the defined types were placed in this encounter.   Disposition:  Follow up in 6 months  Signed, [name], MD  04/04/2019 11:43 AM    [place]

## 2019-04-04 NOTE — Patient Instructions (Addendum)
  Medication Instructions:  START METOPROLOL AS DIRECTED BY YOUR PRIMARY CARE   If you need a refill on your cardiac medications before your next appointment, please call your pharmacy.   Lab work: NONE  Testing/Procedures: NONE   Follow-Up: At [facility], you and your health needs are our priority.  As part of our continuing mission to provide you with exceptional heart care, we have created designated Provider Care Teams.  These Care Teams include your primary Cardiologist (physician) and Advanced Practice Providers (APPs -  Physician Assistants and Nurse Practitioners) who all work together to provide you with the care you need, when you need it. You will need a follow up appointment in 6 months.  Please call our office 2 months in advance to schedule this appointment.  You may see [name], MD or one of the following Advanced Practice Providers on your designated Care Team:   Kerin Ransom, PA-C Roby Lofts, [place] . Sande Rives, PA-C

## 2019-04-09 ENCOUNTER — Encounter: Payer: Self-pay | Admitting: Pharmacist

## 2019-04-09 ENCOUNTER — Ambulatory Visit: Payer: Self-pay | Admitting: Nurse Practitioner

## 2019-04-10 ENCOUNTER — Other Ambulatory Visit: Payer: Self-pay

## 2019-04-10 ENCOUNTER — Ambulatory Visit: Payer: Self-pay | Attending: Family Medicine | Admitting: Pharmacist

## 2019-04-10 DIAGNOSIS — Z86711 Personal history of pulmonary embolism: Secondary | ICD-10-CM

## 2019-04-10 DIAGNOSIS — Z9889 Other specified postprocedural states: Secondary | ICD-10-CM

## 2019-04-10 LAB — POCT INR: INR: 1.7 — AB (ref 2.0–3.0)

## 2019-04-16 ENCOUNTER — Ambulatory Visit: Payer: Self-pay | Admitting: Physician Assistant

## 2019-04-19 ENCOUNTER — Ambulatory Visit: Payer: Self-pay | Admitting: Physician Assistant

## 2019-04-19 ENCOUNTER — Telehealth: Payer: Self-pay | Admitting: Nurse Practitioner

## 2019-04-19 ENCOUNTER — Ambulatory Visit: Payer: Self-pay | Attending: Nurse Practitioner

## 2019-04-19 ENCOUNTER — Other Ambulatory Visit: Payer: Self-pay

## 2019-04-19 NOTE — Telephone Encounter (Signed)
I called Pt since he had an TELE visit for apply for CAF and OC, Pt was informed that he only submitted the application an unfortunate he can not be qualified just with out any documentation he need to present, Pt understood and will bring no later than the 04/26/2019 or he need to reapply

## 2019-04-23 ENCOUNTER — Ambulatory Visit: Payer: Self-pay | Admitting: Physician Assistant

## 2019-04-24 ENCOUNTER — Ambulatory Visit: Payer: Self-pay | Admitting: Physician Assistant

## 2019-04-24 ENCOUNTER — Encounter: Payer: Self-pay | Admitting: Pharmacist

## 2019-04-26 ENCOUNTER — Other Ambulatory Visit: Payer: Self-pay

## 2019-04-26 ENCOUNTER — Encounter: Payer: Self-pay | Admitting: Physician Assistant

## 2019-04-26 ENCOUNTER — Ambulatory Visit (INDEPENDENT_AMBULATORY_CARE_PROVIDER_SITE_OTHER): Payer: Self-pay | Admitting: Orthopedic Surgery

## 2019-04-26 DIAGNOSIS — M6702 Short Achilles tendon (acquired), left ankle: Secondary | ICD-10-CM

## 2019-04-26 DIAGNOSIS — M6701 Short Achilles tendon (acquired), right ankle: Secondary | ICD-10-CM

## 2019-04-26 DIAGNOSIS — Z89412 Acquired absence of left great toe: Secondary | ICD-10-CM

## 2019-04-26 DIAGNOSIS — M67432 Ganglion, left wrist: Secondary | ICD-10-CM

## 2019-04-27 ENCOUNTER — Ambulatory Visit: Payer: Self-pay | Attending: Nurse Practitioner | Admitting: Pharmacist

## 2019-04-27 DIAGNOSIS — Z86711 Personal history of pulmonary embolism: Secondary | ICD-10-CM

## 2019-04-27 DIAGNOSIS — Z9889 Other specified postprocedural states: Secondary | ICD-10-CM

## 2019-04-27 LAB — POCT INR: INR: 1.5 — AB (ref 2.0–3.0)

## 2019-04-27 MED ORDER — WARFARIN SODIUM 2.5 MG PO TABS: ORAL_TABLET | ORAL | 2 refills | Status: DC

## 2019-04-30 ENCOUNTER — Ambulatory Visit: Payer: Self-pay

## 2019-04-30 ENCOUNTER — Encounter: Payer: Self-pay | Admitting: Orthopedic Surgery

## 2019-04-30 NOTE — Progress Notes (Signed)
Office Visit Note   Patient: Leonard Bentley           Date of Birth: [birth year]           MRN: [id number] Visit Date: 04/26/2019              Requested by: [name], NP [address] [place],  [place] [address] PCP: [name], NP  Chief Complaint  Patient presents with  . Left Foot - Follow-up  . Right Foot - Follow-up      HPI: Patient is a 58 year old gentleman who is status post left great toe amputation who has progressive clawing of the second toe left foot stating that he does have increased callus and pain.  Patient also states he has a new ganglion cyst on the volar aspect radial left wrist.  Assessment & Plan: Visit Diagnoses:  1. Status post amputation of left great toe (HCC)   2. Ganglion cyst of volar aspect of left wrist     Plan: Recommended observation for the ganglion cyst.  Recommended stiff soled cushioned sneakers to unload the forefoot recommended Achilles stretching.  Follow-Up Instructions: No follow-ups on file.   Ortho Exam  Patient is alert, oriented, no adenopathy, well-dressed, normal affect, normal respiratory effort. Examination patient has Achilles tendon contractures bilaterally with dorsiflexion only to about neutral.  The left great toe amputation is healed well he has clawing of the lesser toes with progressive clawing of the second toe.  There is callus but no ulcers.  Patient has a good dorsalis pedis pulse.  Examination the left wrist he has a ganglion cyst over the volar radial aspect of the wrist this is about 5 mm in diameter.  It is directly over the neurovascular bundle.  Imaging: No results found. No images are attached to the encounter.  Labs: Lab Results  Component Value Date   HGBA1C 5.2 10/25/2016   HGBA1C 6.1 (H) 04/03/2016   HGBA1C 5.9 (H) 08/18/2015   LABURIC 5.2 12/09/2018   LABURIC 7.4 08/18/2015   LABURIC 4.1 12/01/2011   REPTSTATUS 01/14/2019 FINAL 01/09/2019   CULT  01/09/2019    NO GROWTH 5 DAYS  Performed at [facility], [place] [address] [place], [place] [address]      Lab Results  Component Value Date   ALBUMIN 3.8 02/23/2019   ALBUMIN 3.7 02/09/2019   ALBUMIN 3.2 (L) 01/10/2019   LABURIC 5.2 12/09/2018   LABURIC 7.4 08/18/2015   LABURIC 4.1 12/01/2011    There is no height or weight on file to calculate BMI.  Orders:  No orders of the defined types were placed in this encounter.  No orders of the defined types were placed in this encounter.    Procedures: No procedures performed  Clinical Data: No additional findings.  ROS:  All other systems negative, except as noted in the HPI. Review of Systems  Objective: Vital Signs: There were no vitals taken for this visit.  Specialty Comments:  No specialty comments available.  PMFS History: Patient Active Problem List   Diagnosis Date Noted  . Chronic combined systolic and diastolic heart failure ([place]) 03/06/2019  . Coagulation disorder ([place]) 03/06/2019  . Impacted cerumen of both ears 02/09/2019  . Osteomyelitis of great toe of left foot ([place]) 01/09/2019  . Foot ulcer, left ([place]) 12/27/2018  . Cellulitis 12/07/2018  . Bleeding from left ear 11/28/2018  . HTN (hypertension) 11/02/2018  . UTI (urinary tract infection) 11/02/2018  . Ecchymosis   .  Tachyarrhythmia 08/30/2018  . Hypomagnesemia 08/30/2018  . Recurrent pulmonary emboli ([place])   . Hematuria   . Delirium tremens ([place])   . LFTs abnormal   . Alcoholic hepatitis without ascites   . Alcohol withdrawal ([place]) 06/04/2018  . Dizziness   . Posterior tibial tendon dysfunction 02/21/2018  . Supratherapeutic INR 11/27/2016  . Dehydration 11/27/2016  . SVT (supraventricular tachycardia) ([place])   . S/P minimally-invasive mitral valve repair 10/27/2016  . History of pulmonary embolus (PE) 04/11/2016  . Left sided numbness   . Anxiety 04/02/2016  . Panic attacks 04/02/2016  . Mitral valve prolapse   . Mitral regurgitation 01/20/2016  . Atrial  tachycardia ([place]) 01/06/2016  . Murmur 01/06/2016  . Mobitz I 10/09/2015  . PAC (premature atrial contraction) 10/09/2015  . Arthritis 08/14/2015  . Neuropathy due to chemotherapeutic drug ([place]) 03/09/2013  . Leucopenia 01/31/2013  . Personal history of pulmonary embolism 12/28/2012  . Alcohol abuse 08/24/2012  . Cigarette smoker 08/24/2012  . Hemorrhoid 03/08/2012  . Anemia 12/10/2011  . History of non-Hodgkin's lymphoma 09/25/2011   Past Medical History:  Diagnosis Date  . Acute pulmonary embolism ([place]) 04/11/2016  . Anxiety   . Arthritis   . Atrial tachycardia ([place]) 01/06/2016  . Depression   . DTs (delirium tremens) ([place])   . Dyspnea   . ED (erectile dysfunction)   . ETOH abuse   . Hypertension   . Lymphoma, non Hodgkin's 09/25/2011   Stage 2  . Mitral regurgitation 01/20/2016  . Mobitz I 10/09/2015  . Murmur 01/06/2016  . Occasional tremors   . PAC (premature atrial contraction) 10/09/2015  . S/P minimally-invasive mitral valve repair 10/27/2016   Complex valvuloplasty including artificial Gore-tex neochord placement x6 and 34 mm Edwards Physio II ring annuloplasty via right mini thoracotomy approach    Family History  Problem Relation Age of Onset  . Cancer Mother        BREAST(BONE)  . Cancer Father        PANCREATIC  . Hypertension Maternal Grandmother   . Stroke Maternal Aunt   . Heart attack Neg Hx     Past Surgical History:  Procedure Laterality Date  . AMPUTATION Left 01/12/2019   Procedure: LEFT GREAT TOE AMPUTATION;  Surgeon: [name], MD;  Location: [facility];  Service: Orthopedics;  Laterality: Left;  . CARDIAC CATHETERIZATION N/A 08/13/2016   Procedure: Right/Left Heart Cath and Coronary Angiography;  Surgeon: [name], MD;  Location: [facility];  Service: Cardiovascular;  Laterality: N/A;  . MITRAL VALVE REPAIR Right 10/27/2016   Procedure: MINIMALLY INVASIVE MITRAL VALVE REPAIR (MVR) USING 34 PHYSIO II ANNULOPLASTY RING;  Surgeon:  [name], MD;  Location: [facility];  Service: Open Heart Surgery;  Laterality: Right;  . SKIN BIOPSY Right 04/04/2018   right mid medial anterior tibial shave  see report in chart  . TEE WITHOUT CARDIOVERSION N/A 02/11/2016   Procedure: TRANSESOPHAGEAL ECHOCARDIOGRAM (TEE);  Surgeon: [name], MD;  Location: [facility];  Service: Cardiovascular;  Laterality: N/A;  . TEE WITHOUT CARDIOVERSION N/A 10/27/2016   Procedure: TRANSESOPHAGEAL ECHOCARDIOGRAM (TEE);  Surgeon: [name], MD;  Location: [facility];  Service: Open Heart Surgery;  Laterality: N/A;  . TRIGGER FINGER RELEASE Bilateral    Social History   Occupational History  . Occupation: unemployed  Tobacco Use  . Smoking status: Current Every [place] Smoker    Packs/[place]: 1.00    Years: 40.00    Pack years: 40.00  Types: Cigarettes, E-cigarettes  . Smokeless tobacco: Former Systems developer    Quit date: 1985  . Tobacco comment: vapes all [place]  Substance and Sexual Activity  . Alcohol use: Yes    Alcohol/week: 0.0 standard drinks    Comment: 1-2 bottles of red wine a [place]  . Drug use: No  . Sexual activity: Not Currently

## 2019-05-01 ENCOUNTER — Other Ambulatory Visit: Payer: Self-pay

## 2019-05-01 ENCOUNTER — Other Ambulatory Visit: Payer: Self-pay | Admitting: Orthopedic Surgery

## 2019-05-01 ENCOUNTER — Telehealth: Payer: Self-pay | Admitting: Orthopedic Surgery

## 2019-05-01 MED ORDER — DICLOFENAC SODIUM 1 % TD GEL: 2.0000 g | Freq: Four times a day (QID) | TRANSDERMAL | 3 refills | Status: DC | PRN

## 2019-05-01 NOTE — Telephone Encounter (Signed)
Please advise if you would like the patient to start this medication. He has not had it before. Thank you.

## 2019-05-01 NOTE — Telephone Encounter (Signed)
rx sent to [facility] in [place]

## 2019-05-01 NOTE — Telephone Encounter (Signed)
Patient called asked if he can have a Rx for Diclofenac gel called into the pharmacy at [facility]. The number to contact patient is [phone]

## 2019-05-01 NOTE — Telephone Encounter (Signed)
Patient was called and informed.

## 2019-05-22 ENCOUNTER — Ambulatory Visit: Payer: Self-pay | Admitting: Nurse Practitioner

## 2019-05-25 ENCOUNTER — Other Ambulatory Visit: Payer: Self-pay

## 2019-05-25 ENCOUNTER — Ambulatory Visit: Payer: Self-pay | Attending: Family Medicine | Admitting: Pharmacist

## 2019-05-25 DIAGNOSIS — Z9889 Other specified postprocedural states: Secondary | ICD-10-CM

## 2019-05-25 DIAGNOSIS — Z86711 Personal history of pulmonary embolism: Secondary | ICD-10-CM

## 2019-05-25 LAB — POCT INR: INR: 2.9 (ref 2.0–3.0)

## 2019-06-01 ENCOUNTER — Ambulatory Visit: Payer: Self-pay | Admitting: Nurse Practitioner

## 2019-06-13 ENCOUNTER — Telehealth: Payer: Self-pay | Admitting: Family Medicine

## 2019-06-13 NOTE — Telephone Encounter (Signed)
Dismissal letter in guarantor snapshot  °

## 2019-06-22 ENCOUNTER — Encounter: Payer: Self-pay | Admitting: Pharmacist

## 2019-06-26 ENCOUNTER — Encounter: Payer: Self-pay | Admitting: Pharmacist

## 2019-06-29 ENCOUNTER — Encounter: Payer: Self-pay | Admitting: Pharmacist

## 2019-07-09 ENCOUNTER — Encounter: Payer: Self-pay | Admitting: Pharmacist

## 2019-07-10 ENCOUNTER — Encounter: Payer: Self-pay | Admitting: Pharmacist

## 2019-07-12 ENCOUNTER — Encounter: Payer: Self-pay | Admitting: Pharmacist

## 2019-07-13 ENCOUNTER — Encounter: Payer: Self-pay | Admitting: Pharmacist

## 2019-07-17 ENCOUNTER — Ambulatory Visit: Payer: Self-pay | Attending: Nurse Practitioner | Admitting: Pharmacist

## 2019-07-17 ENCOUNTER — Other Ambulatory Visit: Payer: Self-pay

## 2019-07-17 DIAGNOSIS — Z86711 Personal history of pulmonary embolism: Secondary | ICD-10-CM

## 2019-07-17 DIAGNOSIS — Z9889 Other specified postprocedural states: Secondary | ICD-10-CM

## 2019-07-17 LAB — POCT INR: INR: 3.4 — AB (ref 2.0–3.0)

## 2019-07-23 ENCOUNTER — Ambulatory Visit: Payer: Self-pay | Admitting: Nurse Practitioner

## 2019-07-31 ENCOUNTER — Encounter: Payer: Self-pay | Admitting: Pharmacist

## 2019-08-02 ENCOUNTER — Other Ambulatory Visit: Payer: Self-pay

## 2019-08-02 ENCOUNTER — Ambulatory Visit: Payer: Self-pay | Attending: Family Medicine | Admitting: Pharmacist

## 2019-08-02 DIAGNOSIS — Z9889 Other specified postprocedural states: Secondary | ICD-10-CM

## 2019-08-02 DIAGNOSIS — Z86711 Personal history of pulmonary embolism: Secondary | ICD-10-CM

## 2019-08-02 LAB — POCT INR: INR: 3.9 — AB (ref 2.0–3.0)

## 2019-08-03 ENCOUNTER — Ambulatory Visit: Payer: Self-pay | Attending: Nurse Practitioner | Admitting: Nurse Practitioner

## 2019-08-03 ENCOUNTER — Encounter: Payer: Self-pay | Admitting: Nurse Practitioner

## 2019-08-03 VITALS — BP 130/89 | HR 81 | Temp 98.5°F | Ht 69.5 in | Wt 193.0 lb

## 2019-08-03 DIAGNOSIS — M2550 Pain in unspecified joint: Secondary | ICD-10-CM

## 2019-08-03 DIAGNOSIS — D689 Coagulation defect, unspecified: Secondary | ICD-10-CM

## 2019-08-03 DIAGNOSIS — N399 Disorder of urinary system, unspecified: Secondary | ICD-10-CM

## 2019-08-03 DIAGNOSIS — IMO0002 Reserved for concepts with insufficient information to code with codable children: Secondary | ICD-10-CM

## 2019-08-03 DIAGNOSIS — F419 Anxiety disorder, unspecified: Secondary | ICD-10-CM

## 2019-08-03 DIAGNOSIS — R7989 Other specified abnormal findings of blood chemistry: Secondary | ICD-10-CM

## 2019-08-03 DIAGNOSIS — I471 Supraventricular tachycardia: Secondary | ICD-10-CM

## 2019-08-03 DIAGNOSIS — R945 Abnormal results of liver function studies: Secondary | ICD-10-CM

## 2019-08-03 DIAGNOSIS — F17200 Nicotine dependence, unspecified, uncomplicated: Secondary | ICD-10-CM

## 2019-08-03 DIAGNOSIS — D72819 Decreased white blood cell count, unspecified: Secondary | ICD-10-CM

## 2019-08-03 DIAGNOSIS — Z125 Encounter for screening for malignant neoplasm of prostate: Secondary | ICD-10-CM

## 2019-08-03 DIAGNOSIS — R000 Tachycardia, unspecified: Secondary | ICD-10-CM

## 2019-08-03 DIAGNOSIS — Z1211 Encounter for screening for malignant neoplasm of colon: Secondary | ICD-10-CM

## 2019-08-03 LAB — CBC
Hematocrit: 40.2 % (ref 37.5–51.0)
Hemoglobin: 13 g/dL (ref 13.0–17.7)
MCH: 25.6 pg — ABNORMAL LOW (ref 26.6–33.0)
MCHC: 32.3 g/dL (ref 31.5–35.7)
MCV: 79 fL (ref 79–97)
NRBC: 3 % — ABNORMAL HIGH (ref 0–0)
Platelets: 169 10*3/uL (ref 150–450)
RBC: 5.07 x10E6/uL (ref 4.14–5.80)
RDW: 18.4 % — ABNORMAL HIGH (ref 11.6–15.4)
WBC: 2.9 10*3/uL — ABNORMAL LOW (ref 3.4–10.8)

## 2019-08-03 LAB — POCT URINALYSIS DIP (CLINITEK)
Glucose, UA: NEGATIVE mg/dL
Leukocytes, UA: NEGATIVE
Nitrite, UA: NEGATIVE
POC PROTEIN,UA: 100 — AB
Spec Grav, UA: >=1.03 — AB (ref 1.010–1.025)
Urobilinogen, UA: 1 E.U./dL
pH, UA: 5.5 (ref 5.0–8.0)

## 2019-08-03 MED ORDER — DICLOFENAC SODIUM 1 % TD GEL: 2.0000 g | Freq: Four times a day (QID) | TRANSDERMAL | 3 refills | Status: DC | PRN

## 2019-08-03 MED ORDER — HYDROXYZINE HCL 25 MG PO TABS: 25.0000 mg | ORAL_TABLET | Freq: Three times a day (TID) | ORAL | 1 refills | Status: DC | PRN

## 2019-08-03 NOTE — Progress Notes (Signed)
Assessment & Plan:  Leonard Bentley was seen today for follow-up.  Diagnoses and all orders for this visit:  Essential Hypertension Continue all antihypertensives as prescribed.  Remember to bring in your blood pressure log with you for your follow up appointment.  DASH/Mediterranean Diets are healthier choices for HTN.    Tachycardia Continue metoprolol XL 50 mg daily as prescribed.   Anxiety -     hydrOXYzine (ATARAX/VISTARIL) 25 MG tablet; Take 1 tablet (25 mg total) by mouth 3 (three) times daily as needed.  Prostate cancer screening -     PSA  LFTs abnormal -     CMP14+EGFR -     Lipid panel  Coagulation disorder (HCC) Continue coumadin as prescribed.  Do not skip doses Stop drinking alcohol and smokig  Tobacco dependence Erric was counseled on the dangers of tobacco use, and was advised to quit. Reviewed strategies to maximize success, including removing cigarettes and smoking materials from environment, stress management and support of family/friends as well as pharmacological alternatives including: Wellbutrin, Chantix, Nicotine patch, Nicotine gum or lozenges. Smoking cessation support: smoking cessation hotline: 1-800-QUIT-NOW.  Smoking cessation classes are also available through [facility]. Call [phone] or visit our website at [email or web address]   A total of 3 minutes was spent on counseling for smoking cessation and Aiman is not ready to quit.   Colon cancer screening -     Fecal occult blood, imunochemical([facility])  SVT (supraventricular tachycardia) (HCC) RESOLVED  Hematuria, unspecified type -     POCT URINALYSIS DIP (CLINITEK) -     Urinalysis, Complete -     Microscopic Examination  Arthralgia of multiple sites -     diclofenac sodium (VOLTAREN) 1 % GEL; Apply 2 g topically 4 (four) times daily as needed.    Patient has been counseled on age-appropriate routine health concerns for screening and prevention. These  are reviewed and up-to-date. Referrals have been placed accordingly. Immunizations are up-to-date or declined.    Subjective:   Chief Complaint  Patient presents with  . Follow-up    Pt. is here for a follow up. Pt. would like to see if PCP can give him a medication for anxiety.    HPI [name] 58 y.o. male presents to office today for follow up.  has a past medical history of Acute pulmonary embolism ([place]) (04/11/2016), Anxiety, Arthritis, Atrial tachycardia ([place]) (01/06/2016), Depression, DTs (delirium tremens) ([place]), Dyspnea, ED (erectile dysfunction), ETOH abuse, Hypertension, Lymphoma, non Hodgkin's (09/25/2011), Mitral regurgitation (01/20/2016), Mobitz I (10/09/2015), Murmur (01/06/2016), Occasional tremors, PAC (premature atrial contraction) (10/09/2015), and S/P minimally-invasive mitral valve repair (10/27/2016).  Coumadin is being adjusted by our pharmacist. He continues to drink daily. Wine 2-3 glasses. I have instructed him that it is not advisable to consume any alcohol or wine for patients on coumadin. He also has not been consistent with his coumadin appointments.   Essential Hypertension Taking metoprolol XL. Blood pressure is well controlled today. He does not monitor at home. Denies chest pain, shortness of breath,  lightheadedness, dizziness, headaches or BLE edema. Endorses intermittent palpitations however attributes this to anxiety.  BP Readings from Last 3 Encounters:  08/03/19 130/89  02/23/19 127/86  02/23/19 112/77   Anxiety  History of panic attacks. Has tried buspar in the past with no relief of symptoms. Not interested in antidepressant today. Endorses increased stress with his finances. He is having to move out of his house in a few months and unsure about his future  residence.  When he gets anxious he experiences racing thoughts, palpitations, nervousness.     GU problem 7 days ago he noticed dried blood in his underwear and around the tip of his penis. This has  occurred 3 times int the past. However he denies any recent occurrences since last week. Likely related to coumadin, PT/INR levels. He has not been consistent with his office appointments.  Will check UA. He denies any dysuria, frequency, hesitancy, or any other GU symptoms.       Review of Systems  Constitutional: Negative for fever, malaise/fatigue and weight loss.  HENT: Negative.  Negative for nosebleeds.   Eyes: Negative.  Negative for blurred vision, double vision and photophobia.  Respiratory: Negative.  Negative for cough and shortness of breath.   Cardiovascular: Positive for palpitations. Negative for chest pain and leg swelling.  Gastrointestinal: Negative.  Negative for heartburn, nausea and vomiting.  Genitourinary:       SEE HPI  Musculoskeletal: Positive for joint pain. Negative for myalgias.  Neurological: Negative.  Negative for dizziness, focal weakness, seizures and headaches.  Psychiatric/Behavioral: Positive for depression. Negative for suicidal ideas. The patient is nervous/anxious.     Past Medical History:  Diagnosis Date  . Acute pulmonary embolism ([place]) 04/11/2016  . Anxiety   . Arthritis   . Atrial tachycardia ([place]) 01/06/2016  . Depression   . DTs (delirium tremens) ([place])   . Dyspnea   . ED (erectile dysfunction)   . ETOH abuse   . Hypertension   . Lymphoma, non Hodgkin's 09/25/2011   Stage 2  . Mitral regurgitation 01/20/2016  . Mobitz I 10/09/2015  . Murmur 01/06/2016  . Occasional tremors   . PAC (premature atrial contraction) 10/09/2015  . S/P minimally-invasive mitral valve repair 10/27/2016   Complex valvuloplasty including artificial Gore-tex neochord placement x6 and 34 mm Edwards Physio II ring annuloplasty via right mini thoracotomy approach    Past Surgical History:  Procedure Laterality Date  . AMPUTATION Left 01/12/2019   Procedure: LEFT GREAT TOE AMPUTATION;  Surgeon: [name], MD;  Location: [place];  Service: Orthopedics;   Laterality: Left;  . CARDIAC CATHETERIZATION N/A 08/13/2016   Procedure: Right/Left Heart Cath and Coronary Angiography;  Surgeon: [name], MD;  Location: [place] [facility];  Service: Cardiovascular;  Laterality: N/A;  . MITRAL VALVE REPAIR Right 10/27/2016   Procedure: MINIMALLY INVASIVE MITRAL VALVE REPAIR (MVR) USING 34 PHYSIO II ANNULOPLASTY RING;  Surgeon: [name], MD;  Location: [place];  Service: Open Heart Surgery;  Laterality: Right;  . SKIN BIOPSY Right 04/04/2018   right mid medial anterior tibial shave  see report in chart  . TEE WITHOUT CARDIOVERSION N/A 02/11/2016   Procedure: TRANSESOPHAGEAL ECHOCARDIOGRAM (TEE);  Surgeon: [name], MD;  Location: [place];  Service: Cardiovascular;  Laterality: N/A;  . TEE WITHOUT CARDIOVERSION N/A 10/27/2016   Procedure: TRANSESOPHAGEAL ECHOCARDIOGRAM (TEE);  Surgeon: [name], MD;  Location: [place];  Service: Open Heart Surgery;  Laterality: N/A;  . TRIGGER FINGER RELEASE Bilateral     Family History  Problem Relation Age of Onset  . Cancer Mother        BREAST(BONE)  . Cancer Father        PANCREATIC  . Hypertension Maternal Grandmother   . Stroke Maternal Aunt   . Heart attack Neg Hx     Social History Reviewed with no changes to be made today.   Outpatient Medications Prior to Visit  Medication  Sig Dispense Refill  . acetaminophen (TYLENOL) 650 MG CR tablet Take 1,300 mg by mouth every 8 (eight) hours as needed for pain.     . metoprolol succinate (TOPROL-XL) 50 MG 24 hr tablet Take 1 tablet (50 mg total) by mouth daily. Take with or immediately following a meal. 90 tablet 3  . warfarin (COUMADIN) 2.5 MG tablet Take 1 tablet daily or as directed by the Coumadin clinic. 30 tablet 2  . diclofenac sodium (VOLTAREN) 1 % GEL Apply 2 g topically 4 (four) times daily as needed. 100 g 3  . oxyCODONE (OXY IR/ROXICODONE) 5 MG immediate release tablet Take 1 tablet (5 mg total) by mouth every 4 (four) hours  as needed for severe pain. 30 tablet 0  . carbamide peroxide (DEBROX) 6.5 % OTIC solution Place 5 drops into the left ear 2 (two) times daily as needed (to cleanse the ear canal).      No facility-administered medications prior to visit.     Allergies  Allergen Reactions  . Other Other (See Comments)    "Cactus" caused blisters on tongue (if prepared as food)       Objective:    BP 130/89 (BP Location: Left Arm, Patient Position: Sitting, Cuff Size: Normal)   Pulse 81   Temp 98.5 F (36.9 C) (Oral)   Ht 5' 9.5" (1.765 m)   Wt 193 lb (87.5 kg)   SpO2 97%   BMI 28.09 kg/m  Wt Readings from Last 3 Encounters:  08/03/19 193 lb (87.5 kg)  04/04/19 190 lb (86.2 kg)  03/15/19 190 lb (86.2 kg)    Physical Exam Vitals signs and nursing note reviewed.  Constitutional:      Appearance: He is well-developed.  HENT:     Head: Normocephalic and atraumatic.  Neck:     Musculoskeletal: Normal range of motion.  Cardiovascular:     Rate and Rhythm: Normal rate and regular rhythm.     Heart sounds: Normal heart sounds. No murmur. No friction rub. No gallop.   Pulmonary:     Effort: Pulmonary effort is normal. No tachypnea or respiratory distress.     Breath sounds: Normal breath sounds. No decreased breath sounds, wheezing, rhonchi or rales.  Chest:     Chest wall: No tenderness.  Abdominal:     General: Bowel sounds are normal.     Palpations: Abdomen is soft.  Skin:    General: Skin is warm and dry.  Neurological:     Mental Status: He is alert and oriented to person, place, and time.     Coordination: Coordination normal.  Psychiatric:        Behavior: Behavior normal. Behavior is cooperative.        Thought Content: Thought content normal.        Judgment: Judgment normal.        Patient has been counseled extensively about nutrition and exercise as well as the importance of adherence with medications and regular follow-up. The patient was given clear instructions to go  to ER or return to medical center if symptoms don't improve, worsen or new problems develop. The patient verbalized understanding.   Follow-up: Return in about 3 months (around 11/02/2019) for HTN; , Needs FA paperwork.   [name], FNP-BC [facility] [place], [place]   08/05/2019, 12:04 AM

## 2019-08-04 LAB — CMP14+EGFR
ALT: 129 IU/L — ABNORMAL HIGH (ref 0–44)
AST: 129 IU/L — ABNORMAL HIGH (ref 0–40)
Albumin/Globulin Ratio: 1.7 (ref 1.2–2.2)
Albumin: 4.7 g/dL (ref 3.8–4.9)
Alkaline Phosphatase: 95 IU/L (ref 39–117)
BUN/Creatinine Ratio: 15 (ref 9–20)
BUN: 14 mg/dL (ref 6–24)
Bilirubin Total: 1.5 mg/dL — ABNORMAL HIGH (ref 0.0–1.2)
CO2: 25 mmol/L (ref 20–29)
Calcium: 9.6 mg/dL (ref 8.7–10.2)
Chloride: 94 mmol/L — ABNORMAL LOW (ref 96–106)
Creatinine, Ser: 0.95 mg/dL (ref 0.76–1.27)
GFR calc Af Amer: 101 mL/min/{1.73_m2} (ref >59)
GFR calc non Af Amer: 87 mL/min/{1.73_m2} (ref >59)
Globulin, Total: 2.8 g/dL (ref 1.5–4.5)
Glucose: 101 mg/dL — ABNORMAL HIGH (ref 65–99)
Potassium: 4.5 mmol/L (ref 3.5–5.2)
Sodium: 138 mmol/L (ref 134–144)
Total Protein: 7.5 g/dL (ref 6.0–8.5)

## 2019-08-04 LAB — LIPID PANEL
Chol/HDL Ratio: 2.2 ratio (ref 0.0–5.0)
Cholesterol, Total: 222 mg/dL — ABNORMAL HIGH (ref 100–199)
HDL: 99 mg/dL (ref >39)
LDL Chol Calc (NIH): 111 mg/dL — ABNORMAL HIGH (ref 0–99)
Triglycerides: 68 mg/dL (ref 0–149)
VLDL Cholesterol Cal: 12 mg/dL (ref 5–40)

## 2019-08-04 LAB — URINALYSIS, COMPLETE
Bilirubin, UA: NEGATIVE
Glucose, UA: NEGATIVE
Leukocytes,UA: NEGATIVE
Nitrite, UA: NEGATIVE
Specific Gravity, UA: 1.026 (ref 1.005–1.030)
Urobilinogen, Ur: 1 mg/dL (ref 0.2–1.0)
pH, UA: 5.5 (ref 5.0–7.5)

## 2019-08-04 LAB — PSA: Prostate Specific Ag, Serum: 2.5 ng/mL (ref 0.0–4.0)

## 2019-08-04 LAB — MICROSCOPIC EXAMINATION
Bacteria, UA: NONE SEEN
Casts: NONE SEEN /lpf

## 2019-08-05 ENCOUNTER — Encounter: Payer: Self-pay | Admitting: Nurse Practitioner

## 2019-08-06 ENCOUNTER — Other Ambulatory Visit: Payer: Self-pay | Admitting: Family Medicine

## 2019-08-06 ENCOUNTER — Telehealth: Payer: Self-pay | Admitting: Nurse Practitioner

## 2019-08-06 MED ORDER — WARFARIN SODIUM 2.5 MG PO TABS: ORAL_TABLET | ORAL | 2 refills | Status: DC

## 2019-08-06 NOTE — Telephone Encounter (Signed)
New Message   Pt called wanting to let you know that he is still bleeding. Not much just on and off

## 2019-08-06 NOTE — Telephone Encounter (Signed)
Wanted to route this message to you. We both saw him last week. Labs did  Not reveal anemia but did reveal impaired hepatic function and hematuria. I had him hold Coumadin for 1 [place] and then restart. Do we need to do anything additional at this time?

## 2019-08-06 NOTE — Telephone Encounter (Signed)
New Messsage   1) Medication(s) Requested (by name): Coumadin  2) Pharmacy of Choice: [facility] on [address]  3) Special Requests:   Approved medications will be sent to the pharmacy, we will reach out if there is an issue.  Requests made after 3pm may not be addressed until the following business [place]!  If a patient is unsure of the name of the medication(s) please note and ask patient to call back when they are able to provide all info, do not send to responsible party until all information is available!

## 2019-08-06 NOTE — Telephone Encounter (Signed)
Refills sent

## 2019-08-07 NOTE — Telephone Encounter (Signed)
Not at this time. Thank you Lurena Joiner. Unfortunately he is still consuming alcohol daily which will likely interfere with his coumadin/PT/INR levels and most recently his liver function. I will have him return for labs in 2 weeks. Bien please let him know he needs to come in for labs. Thanks!

## 2019-08-09 ENCOUNTER — Ambulatory Visit: Payer: Self-pay | Admitting: Pharmacist

## 2019-08-10 NOTE — Telephone Encounter (Signed)
Pt. Scheduled a lab appt.  Pt. Is aware to come for his lab drawn.

## 2019-08-14 ENCOUNTER — Ambulatory Visit: Payer: Self-pay | Admitting: Pharmacist

## 2019-08-17 ENCOUNTER — Ambulatory Visit: Payer: Self-pay | Admitting: Pharmacist

## 2019-08-20 ENCOUNTER — Other Ambulatory Visit: Payer: Self-pay

## 2019-08-29 ENCOUNTER — Other Ambulatory Visit: Payer: Self-pay

## 2019-08-29 ENCOUNTER — Ambulatory Visit: Payer: Self-pay | Admitting: Pharmacist

## 2019-08-30 ENCOUNTER — Ambulatory Visit: Payer: Self-pay | Attending: Nurse Practitioner | Admitting: Pharmacist

## 2019-08-30 ENCOUNTER — Other Ambulatory Visit: Payer: Self-pay

## 2019-08-30 ENCOUNTER — Ambulatory Visit: Payer: Self-pay

## 2019-08-30 DIAGNOSIS — Z9889 Other specified postprocedural states: Secondary | ICD-10-CM

## 2019-08-30 DIAGNOSIS — D72819 Decreased white blood cell count, unspecified: Secondary | ICD-10-CM

## 2019-08-30 DIAGNOSIS — Z86711 Personal history of pulmonary embolism: Secondary | ICD-10-CM

## 2019-08-30 LAB — POCT INR: INR: 4.6 — AB (ref 2.0–3.0)

## 2019-08-31 LAB — CBC WITH DIFFERENTIAL/PLATELET
Basophils Absolute: 0.1 10*3/uL (ref 0.0–0.2)
Basos: 1 %
EOS (ABSOLUTE): 0.1 10*3/uL (ref 0.0–0.4)
Eos: 3 %
Hematocrit: 40.2 % (ref 37.5–51.0)
Hemoglobin: 13.4 g/dL (ref 13.0–17.7)
Immature Grans (Abs): 0 10*3/uL (ref 0.0–0.1)
Immature Granulocytes: 0 %
Lymphocytes Absolute: 0.7 10*3/uL (ref 0.7–3.1)
Lymphs: 19 %
MCH: 26.6 pg (ref 26.6–33.0)
MCHC: 33.3 g/dL (ref 31.5–35.7)
MCV: 80 fL (ref 79–97)
Monocytes Absolute: 0.6 10*3/uL (ref 0.1–0.9)
Monocytes: 18 %
NRBC: 2 % — ABNORMAL HIGH (ref 0–0)
Neutrophils Absolute: 2.1 10*3/uL (ref 1.4–7.0)
Neutrophils: 59 %
Platelets: 179 10*3/uL (ref 150–450)
RBC: 5.03 x10E6/uL (ref 4.14–5.80)
RDW: 19.1 % — ABNORMAL HIGH (ref 11.6–15.4)
WBC: 3.6 10*3/uL (ref 3.4–10.8)

## 2019-08-31 LAB — VITAMIN B12: Vitamin B-12: 1684 pg/mL — ABNORMAL HIGH (ref 232–1245)

## 2019-09-01 ENCOUNTER — Other Ambulatory Visit: Payer: Self-pay | Admitting: Nurse Practitioner

## 2019-09-01 DIAGNOSIS — Z8572 Personal history of non-Hodgkin lymphomas: Secondary | ICD-10-CM

## 2019-09-03 ENCOUNTER — Encounter: Payer: Self-pay | Admitting: Pharmacist

## 2019-09-04 ENCOUNTER — Encounter: Payer: Self-pay | Admitting: Pharmacist

## 2019-09-04 ENCOUNTER — Telehealth: Payer: Self-pay

## 2019-09-04 NOTE — Telephone Encounter (Signed)
PCP spoke to patient regarding his lab concerns and referral.   Pt. Understood.

## 2019-09-05 ENCOUNTER — Encounter: Payer: Self-pay | Admitting: Pharmacist

## 2019-09-05 ENCOUNTER — Telehealth: Payer: Self-pay | Admitting: Internal Medicine

## 2019-09-05 NOTE — Telephone Encounter (Signed)
Scheduled appt per 10/27 sch message - unable to reach pt . Left message with appt date and time   

## 2019-09-06 ENCOUNTER — Encounter: Payer: Self-pay | Admitting: Pharmacist

## 2019-09-07 ENCOUNTER — Encounter: Payer: Self-pay | Admitting: Pharmacist

## 2019-09-08 IMAGING — CR DG TOE GREAT 2+V*L*
3 series · 3 of 3 positions shown · non-contrast
Comparison: 12/07/2018

CLINICAL DATA: necrotic great toe, please eval for possible
osteomyelitis/gasPatient reports pain in left great toe for 3 weeks.
Patient states it started as blister along dorsal surface of great
toe 3 weeks ago, he placed a band-aid on it and 2 weeks ago his
distal great toe started to turn black. Pain is localized to great
toe and does not radiate into rest of foot. No prior surgery or
injury to great toe.

EXAM:
LEFT GREAT TOE

[toe ap]
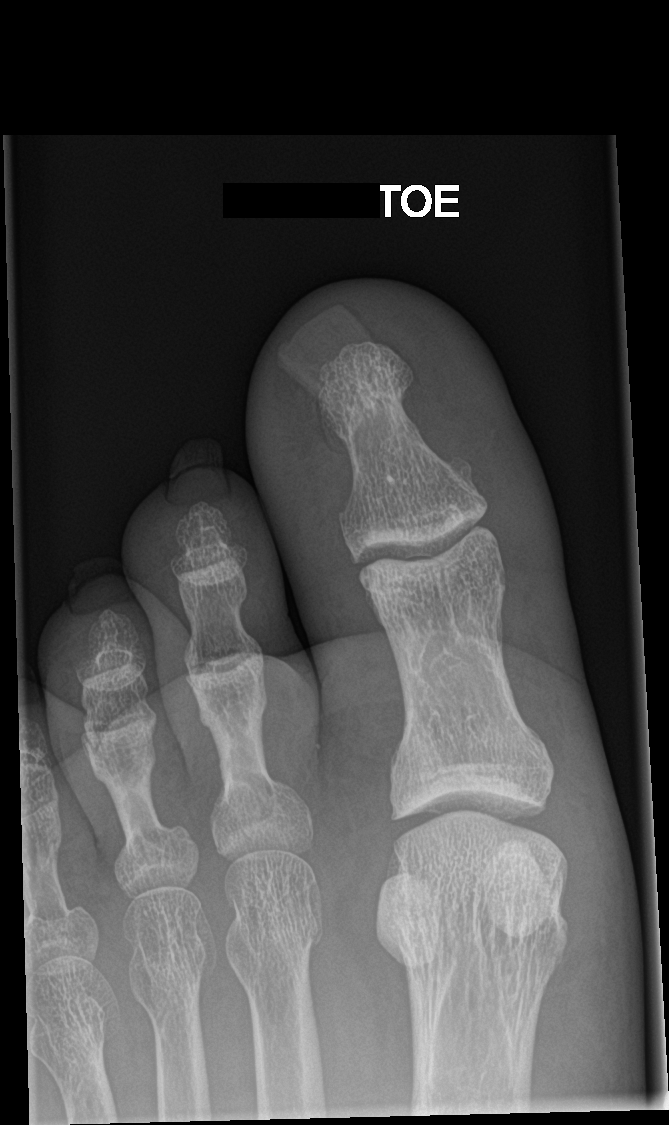

[toe obl]
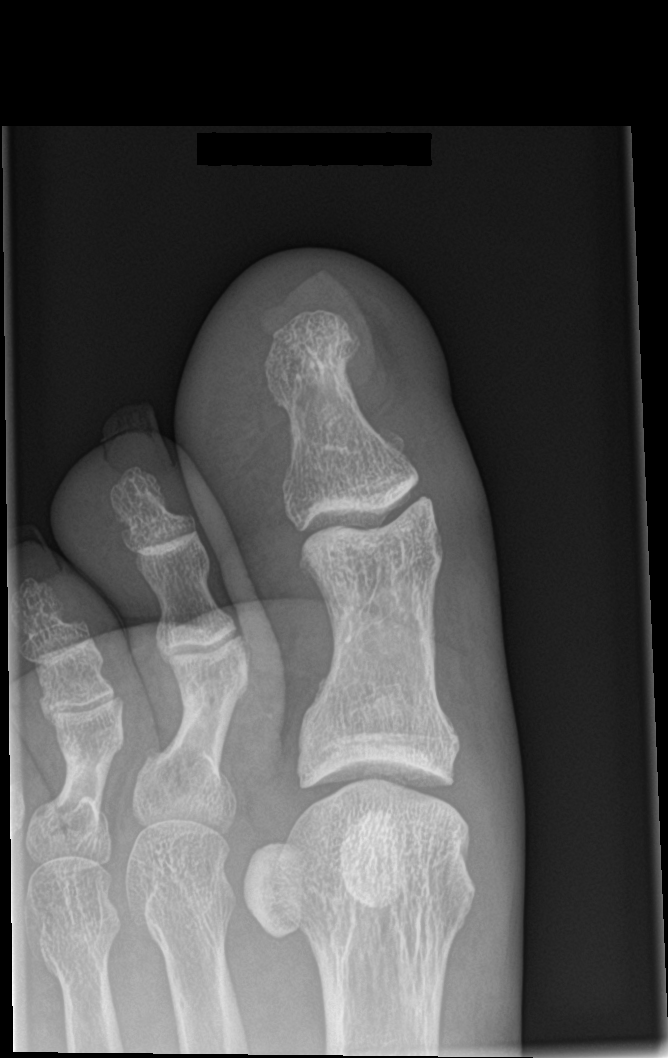

[toe lat]
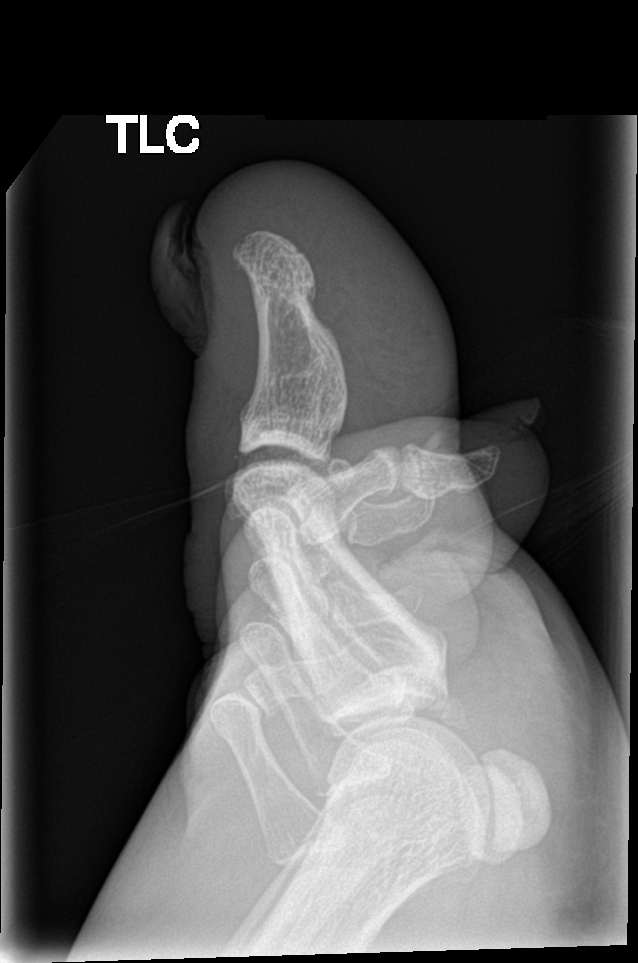

[3 of 3 positions shown; findings below may reference images not displayed]

FINDINGS: There is no evidence of fracture or dislocation. No focal cortical
erosion to suggest osteomyelitis. There is no evidence of
arthropathy or other focal bone abnormality. Soft tissue swelling
about the distal phalanx. No subcutaneous gas or radiodense foreign
body.
IMPRESSION: 1. Soft tissue swelling without bony abnormality.
2. No radiographic evidence of osteomyelitis.

The

## 2019-09-09 IMAGING — DX DG HAND 2V*L*
2 series · 2 of 2 positions shown · non-contrast
Comparison: None.

CLINICAL DATA: Distal left index finger pain following a crush
injury 3 weeks ago.

EXAM:
LEFT HAND - 2 VIEW

[x hand pa left]
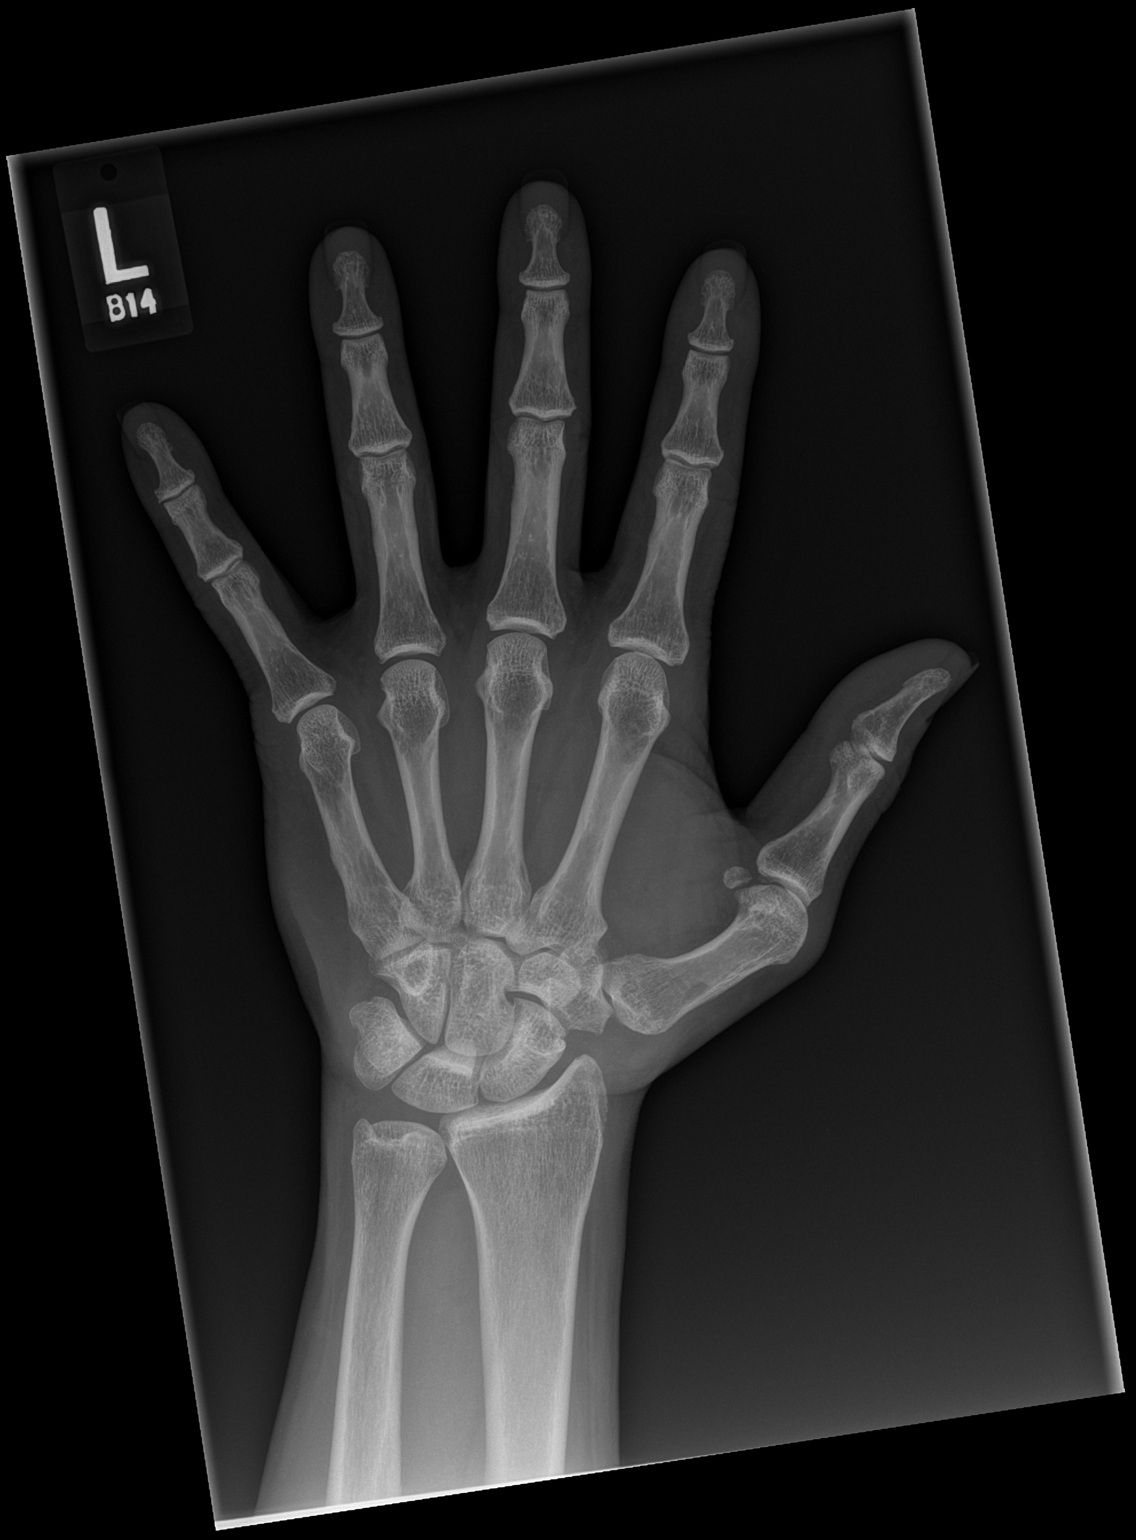

[x hand lat left]
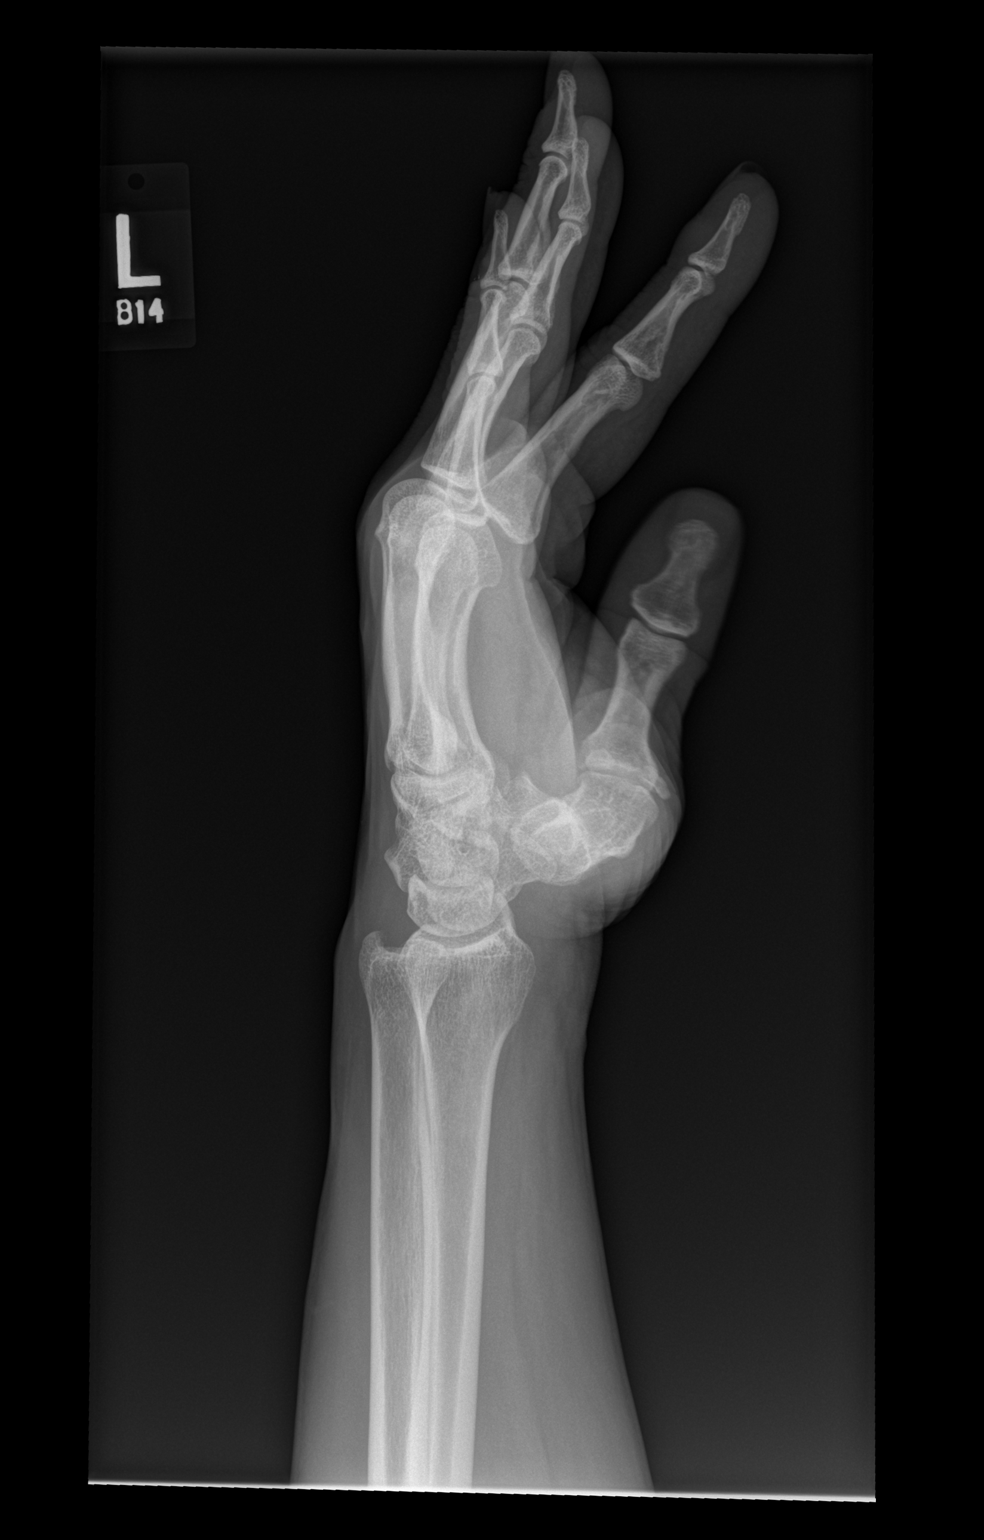

[2 of 2 positions shown; findings below may reference images not displayed]

FINDINGS: There is no evidence of fracture or dislocation. There is no
evidence of arthropathy or other focal bone abnormality. Soft
tissues are unremarkable.
IMPRESSION: Negative.

## 2019-09-13 ENCOUNTER — Ambulatory Visit: Payer: Self-pay | Admitting: Pharmacist

## 2019-09-14 ENCOUNTER — Encounter: Payer: Self-pay | Admitting: Pharmacist

## 2019-09-18 ENCOUNTER — Other Ambulatory Visit: Payer: Self-pay | Admitting: Medical Oncology

## 2019-09-18 DIAGNOSIS — Z8572 Personal history of non-Hodgkin lymphomas: Secondary | ICD-10-CM

## 2019-09-19 ENCOUNTER — Ambulatory Visit: Payer: Self-pay | Attending: Nurse Practitioner | Admitting: Pharmacist

## 2019-09-19 ENCOUNTER — Other Ambulatory Visit: Payer: Self-pay

## 2019-09-19 ENCOUNTER — Inpatient Hospital Stay: Payer: Self-pay | Attending: Internal Medicine | Admitting: Internal Medicine

## 2019-09-19 ENCOUNTER — Inpatient Hospital Stay: Payer: Self-pay

## 2019-09-19 ENCOUNTER — Encounter: Payer: Self-pay | Admitting: Internal Medicine

## 2019-09-19 VITALS — BP 126/92 | HR 105 | Temp 97.9°F | Resp 18 | Ht 69.5 in | Wt 197.4 lb

## 2019-09-19 DIAGNOSIS — Z8572 Personal history of non-Hodgkin lymphomas: Secondary | ICD-10-CM | POA: Insufficient documentation

## 2019-09-19 DIAGNOSIS — F419 Anxiety disorder, unspecified: Secondary | ICD-10-CM | POA: Insufficient documentation

## 2019-09-19 DIAGNOSIS — Z86711 Personal history of pulmonary embolism: Secondary | ICD-10-CM

## 2019-09-19 DIAGNOSIS — Z9889 Other specified postprocedural states: Secondary | ICD-10-CM

## 2019-09-19 DIAGNOSIS — D72819 Decreased white blood cell count, unspecified: Secondary | ICD-10-CM | POA: Insufficient documentation

## 2019-09-19 DIAGNOSIS — Z79899 Other long term (current) drug therapy: Secondary | ICD-10-CM | POA: Insufficient documentation

## 2019-09-19 DIAGNOSIS — D649 Anemia, unspecified: Secondary | ICD-10-CM | POA: Insufficient documentation

## 2019-09-19 DIAGNOSIS — I10 Essential (primary) hypertension: Secondary | ICD-10-CM | POA: Insufficient documentation

## 2019-09-19 DIAGNOSIS — F329 Major depressive disorder, single episode, unspecified: Secondary | ICD-10-CM | POA: Insufficient documentation

## 2019-09-19 DIAGNOSIS — M1990 Unspecified osteoarthritis, unspecified site: Secondary | ICD-10-CM | POA: Insufficient documentation

## 2019-09-19 DIAGNOSIS — Z9221 Personal history of antineoplastic chemotherapy: Secondary | ICD-10-CM | POA: Insufficient documentation

## 2019-09-19 LAB — CBC WITH DIFFERENTIAL (CANCER CENTER ONLY)
Abs Immature Granulocytes: 0.02 K/uL (ref 0.00–0.07)
Basophils Absolute: 0 K/uL (ref 0.0–0.1)
Basophils Relative: 1 %
Eosinophils Absolute: 0.1 K/uL (ref 0.0–0.5)
Eosinophils Relative: 3 %
HCT: 38.3 % — ABNORMAL LOW (ref 39.0–52.0)
Hemoglobin: 12.9 g/dL — ABNORMAL LOW (ref 13.0–17.0)
Immature Granulocytes: 1 %
Lymphocytes Relative: 19 %
Lymphs Abs: 0.6 K/uL — ABNORMAL LOW (ref 0.7–4.0)
MCH: 26.4 pg (ref 26.0–34.0)
MCHC: 33.7 g/dL (ref 30.0–36.0)
MCV: 78.5 fL — ABNORMAL LOW (ref 80.0–100.0)
Monocytes Absolute: 0.6 K/uL (ref 0.1–1.0)
Monocytes Relative: 18 %
Neutro Abs: 1.9 K/uL (ref 1.7–7.7)
Neutrophils Relative %: 58 %
Platelet Count: 163 K/uL (ref 150–400)
RBC: 4.88 MIL/uL (ref 4.22–5.81)
RDW: 18 % — ABNORMAL HIGH (ref 11.5–15.5)
WBC Count: 3.2 K/uL — ABNORMAL LOW (ref 4.0–10.5)
nRBC: 4.9 % — ABNORMAL HIGH (ref 0.0–0.2)

## 2019-09-19 LAB — CMP (CANCER CENTER ONLY)
ALT: 84 U/L — ABNORMAL HIGH (ref 0–44)
AST: 101 U/L — ABNORMAL HIGH (ref 15–41)
Albumin: 4.1 g/dL (ref 3.5–5.0)
Alkaline Phosphatase: 66 U/L (ref 38–126)
Anion gap: 12 (ref 5–15)
BUN: 10 mg/dL (ref 6–20)
CO2: 25 mmol/L (ref 22–32)
Calcium: 9.5 mg/dL (ref 8.9–10.3)
Chloride: 101 mmol/L (ref 98–111)
Creatinine: 1.1 mg/dL (ref 0.61–1.24)
GFR, Est AFR Am: >60 mL/min
GFR, Estimated: >60 mL/min
Glucose, Bld: 116 mg/dL — ABNORMAL HIGH (ref 70–99)
Potassium: 4.3 mmol/L (ref 3.5–5.1)
Sodium: 138 mmol/L (ref 135–145)
Total Bilirubin: 1.1 mg/dL (ref 0.3–1.2)
Total Protein: 7.8 g/dL (ref 6.5–8.1)

## 2019-09-19 LAB — POCT INR: INR: 2.7 (ref 2.0–3.0)

## 2019-09-19 LAB — LACTATE DEHYDROGENASE: LDH: 237 U/L — ABNORMAL HIGH (ref 98–192)

## 2019-09-19 NOTE — Progress Notes (Signed)
[place] [phone]   [phone]  OFFICE PROGRESS NOTE  [name], NP [place] [address]  DIAGNOSIS:  1) Stage II, large B-cell non-Hodgkin's lymphoma diagnosed in September 2012 with a scalp lesion. In addition he had mediastinal lymphadenopathy.  2) bilateral pulmonary embolism diagnosed on CT scan of the chest on 12/28/2012.   PRIOR THERAPY:  1) Systemic chemotherapy with CHOP/Rituxan, given every 3 weeks with Neulasta support, status post 6 cycles, last dose was given 12/01/2011 with complete response.  2) Xarelto 20 mg by mouth a [place].  CURRENT THERAPY: Observation.  INTERVAL HISTORY: Leonard Bentley 58 y.o. male returns to the clinic today for follow-up visit.  The patient was last seen 2 years ago.  He was lost to follow-up secondary to lack of insurance.  He had cardiac surgery as well as amputation of the left great toe secondary to osteomyelitis.  He is feeling much better today but he does not exercise as before.  He gained a lot of weight.  He denied having any current chest pain, shortness of breath, cough or hemoptysis.  He denied having any palpable lymphadenopathy or night sweats.  He has no nausea, vomiting, diarrhea or constipation.  He had CT angiogram of the chest in April 2017 that showed no concerning findings for disease recurrence in the chest.  He is here today for evaluation and repeat blood work.  MEDICAL HISTORY: Past Medical History:  Diagnosis Date  . Acute pulmonary embolism ([place]) 04/11/2016  . Anxiety   . Arthritis   . Atrial tachycardia ([place]) 01/06/2016  . Depression   . DTs (delirium tremens) ([place])   . Dyspnea   . ED (erectile dysfunction)   . ETOH abuse   . Hypertension   . Lymphoma, non Hodgkin's 09/25/2011   Stage 2  . Mitral regurgitation 01/20/2016  . Mobitz I 10/09/2015  . Murmur 01/06/2016  . Occasional tremors   . PAC (premature atrial contraction) 10/09/2015  . S/P  minimally-invasive mitral valve repair 10/27/2016   Complex valvuloplasty including artificial Gore-tex neochord placement x6 and 34 mm Edwards Physio II ring annuloplasty via right mini thoracotomy approach    ALLERGIES:  is allergic to other.  MEDICATIONS:  Current Outpatient Medications  Medication Sig Dispense Refill  . acetaminophen (TYLENOL) 650 MG CR tablet Take 1,300 mg by mouth every 8 (eight) hours as needed for pain.     Marland Kitchen diclofenac sodium (VOLTAREN) 1 % GEL Apply 2 g topically 4 (four) times daily as needed. 100 g 3  . metoprolol succinate (TOPROL-XL) 50 MG 24 hr tablet Take 1 tablet (50 mg total) by mouth daily. Take with or immediately following a meal. 90 tablet 3  . warfarin (COUMADIN) 2.5 MG tablet TAKE ONE TABLET BY MOUTH DAILY AS DIRECTED BY THE COUMADIN CLINIC. 30 tablet 2  . hydrOXYzine (ATARAX/VISTARIL) 25 MG tablet Take 1 tablet (25 mg total) by mouth 3 (three) times daily as needed. 60 tablet 1   No current facility-administered medications for this visit.     SURGICAL HISTORY:  Past Surgical History:  Procedure Laterality Date  . AMPUTATION Left 01/12/2019   Procedure: LEFT GREAT TOE AMPUTATION;  Surgeon: [name], MD;  Location: [place];  Service: Orthopedics;  Laterality: Left;  . CARDIAC CATHETERIZATION N/A 08/13/2016   Procedure: Right/Left Heart Cath and Coronary Angiography;  Surgeon: [name], MD;  Location: [facility] [facility];  Service: Cardiovascular;  Laterality: N/A;  . MITRAL VALVE REPAIR Right 10/27/2016   Procedure: MINIMALLY INVASIVE MITRAL VALVE REPAIR (MVR) USING 34 PHYSIO II ANNULOPLASTY RING;  Surgeon: [name], MD;  Location: [place];  Service: Open Heart Surgery;  Laterality: Right;  . SKIN BIOPSY Right 04/04/2018   right mid medial anterior tibial shave  see report in chart  . TEE WITHOUT CARDIOVERSION N/A 02/11/2016   Procedure: TRANSESOPHAGEAL ECHOCARDIOGRAM (TEE);  Surgeon: [name], MD;  Location: [place];   Service: Cardiovascular;  Laterality: N/A;  . TEE WITHOUT CARDIOVERSION N/A 10/27/2016   Procedure: TRANSESOPHAGEAL ECHOCARDIOGRAM (TEE);  Surgeon: [name], MD;  Location: [facility];  Service: Open Heart Surgery;  Laterality: N/A;  . TRIGGER FINGER RELEASE Bilateral     REVIEW OF SYSTEMS:  A comprehensive review of systems was negative except for: Constitutional: positive for fatigue   PHYSICAL EXAMINATION: General appearance: alert, cooperative, fatigued and no distress Head: Normocephalic, without obvious abnormality, atraumatic Neck: no adenopathy Lymph nodes: Cervical, supraclavicular, and axillary nodes normal. Resp: clear to auscultation bilaterally Back: symmetric, no curvature. ROM normal. No CVA tenderness. Cardio: regular rate and rhythm, S1, S2 normal, no murmur, click, rub or gallop GI: soft, non-tender; bowel sounds normal; no masses,  no organomegaly Extremities: extremities normal, atraumatic, no cyanosis or edema  ECOG PERFORMANCE STATUS: 1 - Symptomatic but completely ambulatory  Blood pressure (!) 126/92, pulse (!) 105, temperature 97.9 F (36.6 C), temperature source Temporal, resp. rate 18, height 5' 9.5" (1.765 m), weight 197 lb 6.4 oz (89.5 kg), SpO2 98 %.  LABORATORY DATA: Lab Results  Component Value Date   WBC 3.2 (L) 09/19/2019   HGB 12.9 (L) 09/19/2019   HCT 38.3 (L) 09/19/2019   MCV 78.5 (L) 09/19/2019   PLT 163 09/19/2019      Chemistry      Component Value Date/Time   NA 138 08/03/2019 1121   NA 140 09/21/2017 0825   K 4.5 08/03/2019 1121   K 4.5 09/21/2017 0825   CL 94 (L) 08/03/2019 1121   CL 98 12/28/2012 0824   CO2 25 08/03/2019 1121   CO2 27 09/21/2017 0825   BUN 14 08/03/2019 1121   BUN 18.1 09/21/2017 0825   CREATININE 0.95 08/03/2019 1121   CREATININE 1.0 09/21/2017 0825      Component Value Date/Time   CALCIUM 9.6 08/03/2019 1121   CALCIUM 9.8 09/21/2017 0825   ALKPHOS 95 08/03/2019 1121   ALKPHOS 63 09/21/2017 0825    AST 129 (H) 08/03/2019 1121   AST 58 (H) 09/21/2017 0825   ALT 129 (H) 08/03/2019 1121   ALT 37 09/21/2017 0825   BILITOT 1.5 (H) 08/03/2019 1121   BILITOT 0.62 09/21/2017 0825       RADIOGRAPHIC STUDIES: No results found. ASSESSMENT AND PLAN:  This is a very pleasant 58 years old African-American male with history of stage II large B-cell non-Hodgkin lymphoma status post chemotherapy with CHOP/Rituxan with complete response. Has been observation since 2013. The patient is feeling fine today with no concerning complaints except for fatigue. CBC today is unremarkable except for mild anemia and leukocytopenia. I recommended for him to continue on observation with repeat CT scan of the chest, abdomen pelvis in 1 year. He was advised to call immediately if he has any concerning symptoms in the interval. The patient voices understanding of current disease status and treatment options and is in agreement with the current care plan. All questions were answered. The patient knows to call  the clinic with any problems, questions or concerns. We can certainly see the patient much sooner if necessary. I spent 10 minutes counseling the patient face to face. The total time spent in the appointment was 15 minutes.  Disclaimer: This note was dictated with voice recognition software. Similar sounding words can inadvertently be transcribed and may be missed upon review.

## 2019-09-19 NOTE — Patient Instructions (Signed)
Steps to Quit Smoking Smoking tobacco is the leading cause of preventable death. It can affect almost every organ in the body. Smoking puts you and people around you at risk for many serious, long-lasting (chronic) diseases. Quitting smoking can be hard, but it is one of the best things that you can do for your health. It is never too late to quit. How do I get ready to quit? When you decide to quit smoking, make a plan to help you succeed. Before you quit:  Pick a date to quit. Set a date within the next 2 weeks to give you time to prepare.  Write down the reasons why you are quitting. Keep this list in places where you will see it often.  Tell your family, friends, and co-workers that you are quitting. Their support is important.  Talk with your doctor about the choices that may help you quit.  Find out if your health insurance will pay for these treatments.  Know the people, places, things, and activities that make you want to smoke (triggers). Avoid them. What first steps can I take to quit smoking?  Throw away all cigarettes at home, at work, and in your car.  Throw away the things that you use when you smoke, such as ashtrays and lighters.  Clean your car. Make sure to empty the ashtray.  Clean your home, including curtains and carpets. What can I do to help me quit smoking? Talk with your doctor about taking medicines and seeing a counselor at the same time. You are more likely to succeed when you do both.  If you are pregnant or breastfeeding, talk with your doctor about counseling or other ways to quit smoking. Do not take medicine to help you quit smoking unless your doctor tells you to do so. To quit smoking: Quit right away  Quit smoking totally, instead of slowly cutting back on how much you smoke over a period of time.  Go to counseling. You are more likely to quit if you go to counseling sessions regularly. Take medicine You may take medicines to help you quit. Some  medicines need a prescription, and some you can buy over-the-counter. Some medicines may contain a drug called nicotine to replace the nicotine in cigarettes. Medicines may:  Help you to stop having the desire to smoke (cravings).  Help to stop the problems that come when you stop smoking (withdrawal symptoms). Your doctor may ask you to use:  Nicotine patches, gum, or lozenges.  Nicotine inhalers or sprays.  Non-nicotine medicine that is taken by mouth. Find resources Find resources and other ways to help you quit smoking and remain smoke-free after you quit. These resources are most helpful when you use them often. They include:  Online chats with a counselor.  Phone quitlines.  Printed self-help materials.  Support groups or group counseling.  Text messaging programs.  Mobile phone apps. Use apps on your mobile phone or tablet that can help you stick to your quit plan. There are many free apps for mobile phones and tablets as well as websites. Examples include Quit Guide from the CDC and smokefree.gov  What things can I do to make it easier to quit?   Talk to your family and friends. Ask them to support and encourage you.  Call a phone quitline (1-800-QUIT-NOW), reach out to support groups, or work with a counselor.  Ask people who smoke to not smoke around you.  Avoid places that make you want to smoke,   such as: ? Bars. ? Parties. ? Smoke-break areas at work.  Spend time with people who do not smoke.  Lower the stress in your life. Stress can make you want to smoke. Try these things to help your stress: ? Getting regular exercise. ? Doing deep-breathing exercises. ? Doing yoga. ? Meditating. ? Doing a body scan. To do this, close your eyes, focus on one area of your body at a time from head to toe. Notice which parts of your body are tense. Try to relax the muscles in those areas. How will I feel when I quit smoking? [place] 1 to 3 weeks Within the first 24 hours,  you may start to have some problems that come from quitting tobacco. These problems are very bad 2-3 days after you quit, but they do not often last for more than 2-3 weeks. You may get these symptoms:  Mood swings.  Feeling restless, nervous, angry, or annoyed.  Trouble concentrating.  Dizziness.  Strong desire for high-sugar foods and nicotine.  Weight gain.  Trouble pooping (constipation).  Feeling like you may vomit (nausea).  Coughing or a sore throat.  Changes in how the medicines that you take for other issues work in your body.  Depression.  Trouble sleeping (insomnia). Week 3 and afterward After the first 2-3 weeks of quitting, you may start to notice more positive results, such as:  Better sense of smell and taste.  Less coughing and sore throat.  Slower heart rate.  Lower blood pressure.  Clearer skin.  Better breathing.  Fewer sick days. Quitting smoking can be hard. Do not give up if you fail the first time. Some people need to try a few times before they succeed. Do your best to stick to your quit plan, and talk with your doctor if you have any questions or concerns. Summary  Smoking tobacco is the leading cause of preventable death. Quitting smoking can be hard, but it is one of the best things that you can do for your health.  When you decide to quit smoking, make a plan to help you succeed.  Quit smoking right away, not slowly over a period of time.  When you start quitting, seek help from your doctor, family, or friends. This information is not intended to replace advice given to you by your health care provider. Make sure you discuss any questions you have with your health care provider. Document Released: 08/21/2009 Document Revised: 01/12/2019 Document Reviewed: 01/13/2019 Elsevier Patient Education  2020 Elsevier Inc.  

## 2019-09-26 ENCOUNTER — Encounter: Payer: Self-pay | Admitting: Pharmacist

## 2019-09-28 ENCOUNTER — Emergency Department (HOSPITAL_COMMUNITY)
Admission: EM | Admit: 2019-09-28 | Discharge: 2019-09-29 | Disposition: A | Discharge status: Home / Self Care | Payer: Self-pay | Attending: Emergency Medicine | Admitting: Emergency Medicine

## 2019-09-28 ENCOUNTER — Ambulatory Visit: Payer: Self-pay | Admitting: Pharmacist

## 2019-09-28 ENCOUNTER — Emergency Department (HOSPITAL_COMMUNITY): Payer: Self-pay

## 2019-09-28 ENCOUNTER — Other Ambulatory Visit: Payer: Self-pay

## 2019-09-28 DIAGNOSIS — Y906 Blood alcohol level of 120-199 mg/100 ml: Secondary | ICD-10-CM | POA: Insufficient documentation

## 2019-09-28 DIAGNOSIS — R55 Syncope and collapse: Secondary | ICD-10-CM | POA: Insufficient documentation

## 2019-09-28 DIAGNOSIS — Z86711 Personal history of pulmonary embolism: Secondary | ICD-10-CM | POA: Insufficient documentation

## 2019-09-28 DIAGNOSIS — I10 Essential (primary) hypertension: Secondary | ICD-10-CM | POA: Insufficient documentation

## 2019-09-28 DIAGNOSIS — Z7901 Long term (current) use of anticoagulants: Secondary | ICD-10-CM | POA: Insufficient documentation

## 2019-09-28 DIAGNOSIS — F17210 Nicotine dependence, cigarettes, uncomplicated: Secondary | ICD-10-CM | POA: Insufficient documentation

## 2019-09-28 DIAGNOSIS — R05 Cough: Secondary | ICD-10-CM | POA: Insufficient documentation

## 2019-09-28 DIAGNOSIS — F10929 Alcohol use, unspecified with intoxication, unspecified: Secondary | ICD-10-CM | POA: Insufficient documentation

## 2019-09-28 LAB — CBC
HCT: 39.4 % (ref 39.0–52.0)
Hemoglobin: 13.3 g/dL (ref 13.0–17.0)
MCH: 27.4 pg (ref 26.0–34.0)
MCHC: 33.8 g/dL (ref 30.0–36.0)
MCV: 81.2 fL (ref 80.0–100.0)
Platelets: 210 10*3/uL (ref 150–400)
RBC: 4.85 MIL/uL (ref 4.22–5.81)
RDW: 18.6 % — ABNORMAL HIGH (ref 11.5–15.5)
WBC: 2.6 10*3/uL — ABNORMAL LOW (ref 4.0–10.5)
nRBC: 3.1 % — ABNORMAL HIGH (ref 0.0–0.2)

## 2019-09-28 LAB — TROPONIN I (HIGH SENSITIVITY)
Troponin I (High Sensitivity): 6 ng/L (ref <18)
Troponin I (High Sensitivity): 6 ng/L (ref <18)

## 2019-09-28 LAB — ETHANOL: Alcohol, Ethyl (B): 155 mg/dL — ABNORMAL HIGH (ref <10)

## 2019-09-28 LAB — PROTIME-INR
INR: 2.8 — ABNORMAL HIGH (ref 0.8–1.2)
Prothrombin Time: 29.3 seconds — ABNORMAL HIGH (ref 11.4–15.2)

## 2019-09-28 LAB — POC OCCULT BLOOD, ED: Fecal Occult Bld: NEGATIVE

## 2019-09-28 MED ORDER — LORAZEPAM 1 MG PO TABS: 0.0000 mg | ORAL_TABLET | Freq: Four times a day (QID) | ORAL | Status: DC

## 2019-09-28 MED ORDER — LORAZEPAM 2 MG/ML IJ SOLN: 0.0000 mg | Freq: Two times a day (BID) | INTRAMUSCULAR | Status: DC

## 2019-09-28 MED ORDER — LORAZEPAM 2 MG/ML IJ SOLN
0.0000 mg | Freq: Four times a day (QID) | INTRAMUSCULAR | Status: DC
  Administered 2019-09-28: 2 mg via INTRAVENOUS
  Filled 2019-09-28: qty 1

## 2019-09-28 MED ORDER — VITAMIN B-1 100 MG PO TABS: 100.0000 mg | ORAL_TABLET | Freq: Every day | ORAL | Status: DC

## 2019-09-28 MED ORDER — THIAMINE HCL 100 MG/ML IJ SOLN
100.0000 mg | Freq: Every day | INTRAMUSCULAR | Status: DC
  Administered 2019-09-28: 100 mg via INTRAVENOUS
  Filled 2019-09-28: qty 2

## 2019-09-28 MED ORDER — LORAZEPAM 1 MG PO TABS: 0.0000 mg | ORAL_TABLET | Freq: Two times a day (BID) | ORAL | Status: DC

## 2019-09-28 MED ORDER — SODIUM CHLORIDE 0.9 % IV BOLUS
1000.0000 mL | Freq: Once | INTRAVENOUS | Status: AC
  Administered 2019-09-28: 1000 mL via INTRAVENOUS

## 2019-09-28 NOTE — ED Triage Notes (Signed)
Pt coming by EMS after shopping at [facility] when bystander saw him bend over, then fall forward and began having seizure like activity. Incontinent of stool and bladder. Alert and oriented upon EMS arrival.  Pt has hx of one previous seizure a year ago. Hx of a fib, HTN, valve repair, and takes Warfarin daily. Complaining of HA. Hit head and LOC+. 500 cc bolus given. Upon assessment pt states he drinks everyday and has approx "half a bottle of wine". Last drink was last night

## 2019-09-28 NOTE — ED Provider Notes (Signed)
I have personally seen and examined the patient. I have reviewed the documentation on PMH/FH/Soc Hx. I have discussed the plan of care with the resident and patient.  I have reviewed and agree with the resident's documentation. Please see associated encounter note.  Briefly, the patient is a 58 y.o. male here with near syncopal event.  Patient with overall unremarkable vitals.  No fever.  Patient states that he was walking around in [place] when he felt lightheaded.  He went to bend over to pick something up and passed out.  States that he is a daily drinker of wine.  States that he last drank maybe this morning.  He is on Coumadin for atrial fibrillation.  EKG shows atrial fibrillation that is rate controlled.  No signs of trauma on examination.  No infectious symptoms.  Suspect polysubstance/alcohol and dehydration as cause of the event today.  He appears to have some tremors on examination.  Alcohol level is 155 but will still give Ativan given some mild tachycardia and tremors.  Will give IV fluids.  Will get lab work, CT scan to evaluate for intracranial injury.  CT scan of the head and neck is unremarkable.  Chest x-ray without any signs of infection.  Troponin normal.  No chest pain and doubt ACS.  Coumadin at baseline.  Doubt PE.  Will hydrate and anticipate discharge to home.  Suspect dehydration/alcohol abuse as cause of symptoms today.  Patient given thiamine.  This chart was dictated using voice recognition software.  Despite best efforts to proofread,  errors can occur which can change the documentation meaning.     EKG Interpretation  Date/Time:  Friday September 28 2019 21:00:05 EST Ventricular Rate:  89 PR Interval:    QRS Duration: 166 QT Interval:  406 QTC Calculation: [id number] Axis:   -67 Text Interpretation: Atrial fibrillation RBBB and LAFB Confirmed by Lennice Sites [phone]) on 09/28/2019 9:39:46 PM         Lennice Sites, DO 09/28/19 2310

## 2019-09-28 NOTE — ED Provider Notes (Signed)
[facility] EMERGENCY DEPARTMENT Provider Note   CSN: [id number] Arrival date & time: 09/28/19  2035     History   Chief Complaint Chief Complaint  Patient presents with   Loss of Consciousness    HPI Leonard Bentley is a 58 y.o. male.     HPI  Leonard Bentley is 58 year old male with past medical history of prior PE and prior mitral valve repair (on warfarin), anxiety, atrial tachycardia, alcohol use who presents to the ED after a fall.  Per nursing report from EMS, there is concern for syncope rather than seizure given bystander report and no post-ictal state.  Patient states he was bending down to pick up his credit card when he felt lightheaded and subsequently thinks he may have passed out.  Patient reports he was incontinent of urine.  He states in the past he had 1 prior seizure but reports this was not in the setting of alcohol withdrawal.  Patient is unclear if he has ever had alcohol withdrawal symptoms but DTs as listed in his patient list.  He states he drinks a bottle of wine every [place].  Patient's last drink was last night.  He has had mild cough.  Patient endorses intermittent shortness of breath from time to time but is not new.  No fever.  No nausea or vomiting.  Patient denies any headache or vision changes.  No numbness, tingling, weakness. No chest pain.    Past Medical History:  Diagnosis Date   Acute pulmonary embolism ([place]) 04/11/2016   Anxiety    Arthritis    Atrial tachycardia ([place]) 01/06/2016   Depression    DTs (delirium tremens) ([place])    Dyspnea    ED (erectile dysfunction)    ETOH abuse    Hypertension    Lymphoma, non Hodgkin's 09/25/2011   Stage 2   Mitral regurgitation 01/20/2016   Mobitz I 10/09/2015   Murmur 01/06/2016   Occasional tremors    PAC (premature atrial contraction) 10/09/2015   S/P minimally-invasive mitral valve repair 10/27/2016   Complex valvuloplasty including artificial Gore-tex neochord placement x6 and  34 mm Edwards Physio II ring annuloplasty via right mini thoracotomy approach    Patient Active Problem List   Diagnosis Date Noted   Chronic combined systolic and diastolic heart failure ([place]) 03/06/2019   Coagulation disorder ([place]) 03/06/2019   Impacted cerumen of both ears 02/09/2019   Osteomyelitis of great toe of left foot ([place]) 01/09/2019   Foot ulcer, left ([place]) 12/27/2018   Cellulitis 12/07/2018   Bleeding from left ear 11/28/2018   HTN (hypertension) 11/02/2018   UTI (urinary tract infection) 11/02/2018   Ecchymosis    Tachyarrhythmia 08/30/2018   Hypomagnesemia 08/30/2018   Recurrent pulmonary emboli (HCC)    Hematuria    Delirium tremens (HCC)    LFTs abnormal    Alcoholic hepatitis without ascites    Alcohol withdrawal ([place]) 06/04/2018   Dizziness    Posterior tibial tendon dysfunction 02/21/2018   Supratherapeutic INR 11/27/2016   Dehydration 11/27/2016   SVT (supraventricular tachycardia) (HCC)    S/P minimally-invasive mitral valve repair 10/27/2016   History of pulmonary embolus (PE) 04/11/2016   Left sided numbness    Anxiety 04/02/2016   Panic attacks 04/02/2016   Mitral valve prolapse    Mitral regurgitation 01/20/2016   Atrial tachycardia ([place]) 01/06/2016   Murmur 01/06/2016   Mobitz I 10/09/2015   PAC (premature atrial contraction) 10/09/2015   Arthritis 08/14/2015   Neuropathy due  to chemotherapeutic drug ([place]) 03/09/2013   Leucopenia 01/31/2013   Personal history of pulmonary embolism 12/28/2012   Alcohol abuse 08/24/2012   Cigarette smoker 08/24/2012   Hemorrhoid 03/08/2012   Anemia 12/10/2011   History of non-Hodgkin's lymphoma 09/25/2011    Past Surgical History:  Procedure Laterality Date   AMPUTATION Left 01/12/2019   Procedure: LEFT GREAT TOE AMPUTATION;  Surgeon: [name], MD;  Location: [facility];  Service: Orthopedics;  Laterality: Left;   CARDIAC CATHETERIZATION N/A 08/13/2016    Procedure: Right/Left Heart Cath and Coronary Angiography;  Surgeon: [name], MD;  Location: [facility];  Service: Cardiovascular;  Laterality: N/A;   MITRAL VALVE REPAIR Right 10/27/2016   Procedure: MINIMALLY INVASIVE MITRAL VALVE REPAIR (MVR) USING 34 PHYSIO II ANNULOPLASTY RING;  Surgeon: [name], MD;  Location: [facility];  Service: Open Heart Surgery;  Laterality: Right;   SKIN BIOPSY Right 04/04/2018   right mid medial anterior tibial shave  see report in chart   TEE WITHOUT CARDIOVERSION N/A 02/11/2016   Procedure: TRANSESOPHAGEAL ECHOCARDIOGRAM (TEE);  Surgeon: [name], MD;  Location: [facility];  Service: Cardiovascular;  Laterality: N/A;   TEE WITHOUT CARDIOVERSION N/A 10/27/2016   Procedure: TRANSESOPHAGEAL ECHOCARDIOGRAM (TEE);  Surgeon: [name], MD;  Location: [facility];  Service: Open Heart Surgery;  Laterality: N/A;   TRIGGER FINGER RELEASE Bilateral         Home Medications    Prior to Admission medications   Medication Sig Start Date End Date Taking? Authorizing Provider  acetaminophen (TYLENOL) 650 MG CR tablet Take 1,300 mg by mouth every 8 (eight) hours as needed for pain.    Yes [provider]  diclofenac sodium (VOLTAREN) 1 % GEL Apply 2 g topically 4 (four) times daily as needed. Patient taking differently: Apply 2 g topically 4 (four) times daily as needed (Feet pain).  08/03/19  Yes [name], NP  hydrOXYzine (ATARAX/VISTARIL) 25 MG tablet Take 1 tablet (25 mg total) by mouth 3 (three) times daily as needed. Patient taking differently: Take 25 mg by mouth 3 (three) times daily as needed for itching.  08/03/19  Yes [name], NP  metoprolol succinate (TOPROL-XL) 50 MG 24 hr tablet Take 1 tablet (50 mg total) by mouth daily. Take with or immediately following a meal. 03/06/19  Yes [name], NP  warfarin (COUMADIN) 2.5 MG tablet TAKE ONE TABLET BY MOUTH DAILY AS DIRECTED BY THE COUMADIN  CLINIC. Patient taking differently: Take 2.5 mg by mouth daily. . 08/06/19  Yes Charlott Rakes, MD    Family History Family History  Problem Relation Age of Onset   Cancer Mother        BREAST(BONE)   Cancer Father        PANCREATIC   Hypertension Maternal Grandmother    Stroke Maternal Aunt    Heart attack Neg Hx     Social History Social History   Tobacco Use   Smoking status: Current Every Schafer Smoker    Packs/[place]: 1.00    Years: 40.00    Pack years: 40.00    Types: Cigarettes, E-cigarettes   Smokeless tobacco: Former Systems developer    Quit date: 1985   Tobacco comment: vapes all [place]  Substance Use Topics   Alcohol use: Yes    Alcohol/week: 0.0 standard drinks    Comment: 1-2 bottles of red wine a [place]   Drug use: No     Allergies   Other  Review of Systems Review of Systems  Constitutional: Negative for chills and fever.  HENT: Negative for ear pain.   Eyes: Negative for pain and visual disturbance.  Respiratory: Positive for cough and shortness of breath.   Cardiovascular: Negative for chest pain and palpitations.  Gastrointestinal: Negative for abdominal pain and vomiting.  Genitourinary: Negative for dysuria and hematuria.  Musculoskeletal: Negative for arthralgias and back pain.  Skin: Negative for color change and rash.  Neurological: Positive for syncope and light-headedness. Negative for speech difficulty.  All other systems reviewed and are negative.    Physical Exam Updated Vital Signs BP 107/65 (BP Location: Left Arm)    Pulse 97    Temp 97.7 F (36.5 C) (Oral)    Resp 18    Ht 5\' 9"  (1.753 m)    Wt 89.4 kg    SpO2 99%    BMI 29.09 kg/m   Physical Exam Vitals signs and nursing note reviewed.  Constitutional:      Appearance: He is well-developed.  HENT:     Head: Normocephalic and atraumatic.  Eyes:     Extraocular Movements: Extraocular movements intact.     Conjunctiva/sclera: Conjunctivae normal.     Pupils: Pupils are equal,  round, and reactive to light.  Neck:     Musculoskeletal: Neck supple.  Cardiovascular:     Rate and Rhythm: Normal rate and regular rhythm.     Heart sounds: No murmur.  Pulmonary:     Effort: Pulmonary effort is normal. No respiratory distress.     Breath sounds: Normal breath sounds.  Abdominal:     Palpations: Abdomen is soft.     Tenderness: There is no abdominal tenderness. There is no guarding.  Musculoskeletal: Normal range of motion.        General: No swelling or tenderness.  Skin:    General: Skin is warm and dry.  Neurological:     General: No focal deficit present.     Mental Status: He is alert and oriented to person, place, and time.     Cranial Nerves: No cranial nerve deficit.     Sensory: No sensory deficit.     Motor: No weakness.     Comments: Alert, oriented, amnestic to full event Mildly tremulous on arrival       ED Treatments / Results  Labs (all labs ordered are listed, but only abnormal results are displayed) Labs Reviewed  CBC - Abnormal; Notable for the following components:      Result Value   WBC 2.6 (*)    RDW 18.6 (*)    nRBC 3.1 (*)    All other components within normal limits  PROTIME-INR - Abnormal; Notable for the following components:   Prothrombin Time 29.3 (*)    INR 2.8 (*)    All other components within normal limits  ETHANOL - Abnormal; Notable for the following components:   Alcohol, Ethyl (B) 155 (*)    All other components within normal limits  POC OCCULT BLOOD, ED  TROPONIN I (HIGH SENSITIVITY)  TROPONIN I (HIGH SENSITIVITY)    EKG EKG Interpretation  Date/Time:  Friday September 28 2019 21:00:05 EST Ventricular Rate:  89 PR Interval:    QRS Duration: 166 QT Interval:  406 QTC Calculation: 494 R Axis:   -67 Text Interpretation: Atrial fibrillation RBBB and LAFB Confirmed by Lennice Sites [phone]) on 09/28/2019 9:39:46 PM   Radiology Ct Head Wo Contrast  Result Date: 09/28/2019 CLINICAL DATA:  Seizure EXAM:  CT HEAD WITHOUT CONTRAST CT CERVICAL SPINE WITHOUT CONTRAST TECHNIQUE: Multidetector CT imaging of the head and cervical spine was performed following the standard protocol without intravenous contrast. Multiplanar CT image reconstructions of the cervical spine were also generated. COMPARISON:  CT brain 11/27/2016 FINDINGS: CT HEAD FINDINGS Brain: No acute territorial infarction, hemorrhage or intracranial mass. Atrophy and mild hypodensity in the subcortical white matter presumably small-vessel ischemic change. Stable ventricle size. Vascular: No hyperdense vessels.  No unexpected calcification Skull: Normal. Negative for fracture or focal lesion. Sinuses/Orbits: Mucosal thickening in the ethmoid sphenoid and frontal sinuses Other: Small forehead swelling CT CERVICAL SPINE FINDINGS Alignment: No subluxation.  Facet alignment within normal limits. Skull base and vertebrae: No acute fracture. No primary bone lesion or focal pathologic process. Soft tissues and spinal canal: No prevertebral fluid or swelling. No visible canal hematoma. Disc levels: Moderate diffuse degenerative changes C5-C6, C6-C7 and C7-T1. Mild disc space narrowing at C4-C5. Upper chest: Apical emphysema Other: None IMPRESSION: 1. No CT evidence for acute intracranial abnormality. Atrophy and mild small vessel ischemic change of the white matter. 2. Degenerative changes of the cervical spine. No definite acute osseous abnormality 3. Emphysema 4. Electronically Signed   By: Donavan Foil M.D.   On: 09/28/2019 22:20   Ct Cervical Spine Wo Contrast  Result Date: 09/28/2019 CLINICAL DATA:  Seizure EXAM: CT HEAD WITHOUT CONTRAST CT CERVICAL SPINE WITHOUT CONTRAST TECHNIQUE: Multidetector CT imaging of the head and cervical spine was performed following the standard protocol without intravenous contrast. Multiplanar CT image reconstructions of the cervical spine were also generated. COMPARISON:  CT brain 11/27/2016 FINDINGS: CT HEAD FINDINGS Brain:  No acute territorial infarction, hemorrhage or intracranial mass. Atrophy and mild hypodensity in the subcortical white matter presumably small-vessel ischemic change. Stable ventricle size. Vascular: No hyperdense vessels.  No unexpected calcification Skull: Normal. Negative for fracture or focal lesion. Sinuses/Orbits: Mucosal thickening in the ethmoid sphenoid and frontal sinuses Other: Small forehead swelling CT CERVICAL SPINE FINDINGS Alignment: No subluxation.  Facet alignment within normal limits. Skull base and vertebrae: No acute fracture. No primary bone lesion or focal pathologic process. Soft tissues and spinal canal: No prevertebral fluid or swelling. No visible canal hematoma. Disc levels: Moderate diffuse degenerative changes C5-C6, C6-C7 and C7-T1. Mild disc space narrowing at C4-C5. Upper chest: Apical emphysema Other: None IMPRESSION: 1. No CT evidence for acute intracranial abnormality. Atrophy and mild small vessel ischemic change of the white matter. 2. Degenerative changes of the cervical spine. No definite acute osseous abnormality 3. Emphysema 4. Electronically Signed   By: Donavan Foil M.D.   On: 09/28/2019 22:20   Dg Chest Portable 1 View  Result Date: 09/28/2019 CLINICAL DATA:  Syncope EXAM: PORTABLE CHEST 1 VIEW COMPARISON:  02/23/2019, 12/07/2018 FINDINGS: No focal opacity or pleural effusion. Micro nodular parenchymal pattern again noted. Stable cardiomediastinal silhouette. No pneumothorax. Valve prosthesis. IMPRESSION: No active disease. Stable borderline to mild cardiomegaly and chronic reticulonodular opacities in the right greater than left lung. Electronically Signed   By: Donavan Foil M.D.   On: 09/28/2019 21:49    Procedures Procedures (including critical care time)  Medications Ordered in ED Medications  sodium chloride 0.9 % bolus 1,000 mL (0 mLs Intravenous Stopped 09/28/19 2314)     Initial Impression / Assessment and Plan / ED Course  I have reviewed the  triage vital signs and the nursing notes.  Pertinent labs & imaging results that were available during my care of the patient were reviewed by  me and considered in my medical decision making (see chart for details).     On arrival, patient is alert and oriented.  Patient appears mildly intoxicated.  Vital signs stable  No evidence of external trauma.  No gross neuro deficits.  Abdomen is soft and benign and nonfocal.  Lungs clear to auscultation bilaterally.  Leukopenia similar to prior.  Hemoglobin within normal limits INR 2.8  In setting of syncope versus seizure, troponin x2 -.  Patient denies any chest pain.  EKG: Poor baseline but appears to be regular rhythm vs afib/flutter; RBBB, similar to prior Low suspicion for cardiac event.  EtOH found to be elevated at 155.  Upon further assessment, patient appears to be more stable. He initially had some tremors of his extremities and was given ativan.   After time to metabolize, pt was reassessed. He is awake and alert and reports he is feeling well. Appears clinically sober. Pt initially had what appeared to be factitious tremors upon standing and when given a task and asked to walk to get his closed, tremors abated and patient ambulated without difficulty. Stable for discharge at this time. Overall concern for syncope in setting of intoxication. He was given local resources to help aid with alcohol abuse. Pt reports he will call a cab for transport. Strict return precautions given.    Final Clinical Impressions(s) / ED Diagnoses   Final diagnoses:  Syncope, unspecified syncope type  Alcoholic intoxication with complication [facility])    ED Discharge Orders    None       Burns Spain, MD 09/29/19 [place], [place], DO 09/30/19 [id number]

## 2019-09-28 NOTE — ED Notes (Signed)
Liter of fluid infused

## 2019-09-29 NOTE — ED Notes (Signed)
Pt sleeping since the ativan   

## 2019-11-05 ENCOUNTER — Encounter: Payer: Self-pay | Admitting: Nurse Practitioner

## 2019-11-05 ENCOUNTER — Ambulatory Visit: Payer: Self-pay | Attending: Nurse Practitioner | Admitting: Nurse Practitioner

## 2019-11-05 ENCOUNTER — Other Ambulatory Visit: Payer: Self-pay

## 2019-11-05 DIAGNOSIS — F411 Generalized anxiety disorder: Secondary | ICD-10-CM

## 2019-11-05 MED ORDER — BUSPIRONE HCL 15 MG PO TABS: 15.0000 mg | ORAL_TABLET | Freq: Three times a day (TID) | ORAL | 1 refills | Status: AC

## 2019-11-05 NOTE — Progress Notes (Signed)
Virtual Visit via Telephone Note Due to national recommendations of social distancing due to [place] 19, telehealth visit is felt to be most appropriate for this patient at this time.  I discussed the limitations, risks, security and privacy concerns of performing an evaluation and management service by telephone and the availability of in person appointments. I also discussed with the patient that there may be a patient responsible charge related to this service. The patient expressed understanding and agreed to proceed.    I connected with Ezelle Delashmutt Reeder on 11/05/19  at   1:30 PM EST  EDT by telephone and verified that I am speaking with the correct person using two identifiers.   Consent I discussed the limitations, risks, security and privacy concerns of performing an evaluation and management service by telephone and the availability of in person appointments. I also discussed with the patient that there may be a patient responsible charge related to this service. The patient expressed understanding and agreed to proceed.   Location of Patient: Private Residence   Location of Provider: [place] and [place] participating in Telemedicine visit: [name] FNP-BC Parmele Henricksen    History of Present Illness: Telemedicine visit for: Follow Up He does not monitor his blood pressure at home.  Requested turmeric or ginger for arthralgias. I recommended he not take either of these as he is on coumadin. He verbalized understanding.   Smoking 1/2 pack of cigarettes per [place]. Not vaping anymore. Endorses shortness of breath with activity but not with rest. Denies any BLE edema. Also endorses increased anxiety and fatigue. Hydroxyzine not helpful in relieving his symptoms. Will try buspar. He was seen in the ED last month for syncopal activity however denies any recent occurrences since discharge.   Past Medical History:  Diagnosis Date  . Acute  pulmonary embolism ([place]) 04/11/2016  . Anxiety   . Arthritis   . Atrial tachycardia ([place]) 01/06/2016  . Depression   . DTs (delirium tremens) ([place])   . Dyspnea   . ED (erectile dysfunction)   . ETOH abuse   . Hypertension   . Lymphoma, non Hodgkin's 09/25/2011   Stage 2  . Mitral regurgitation 01/20/2016  . Mobitz I 10/09/2015  . Murmur 01/06/2016  . Occasional tremors   . PAC (premature atrial contraction) 10/09/2015  . S/P minimally-invasive mitral valve repair 10/27/2016   Complex valvuloplasty including artificial Gore-tex neochord placement x6 and 34 mm Edwards Physio II ring annuloplasty via right mini thoracotomy approach    Past Surgical History:  Procedure Laterality Date  . AMPUTATION Left 01/12/2019   Procedure: LEFT GREAT TOE AMPUTATION;  Surgeon: [name], MD;  Location: [facility];  Service: Orthopedics;  Laterality: Left;  . CARDIAC CATHETERIZATION N/A 08/13/2016   Procedure: Right/Left Heart Cath and Coronary Angiography;  Surgeon: [name], MD;  Location: [facility];  Service: Cardiovascular;  Laterality: N/A;  . MITRAL VALVE REPAIR Right 10/27/2016   Procedure: MINIMALLY INVASIVE MITRAL VALVE REPAIR (MVR) USING 34 PHYSIO II ANNULOPLASTY RING;  Surgeon: [name], MD;  Location: [facility];  Service: Open Heart Surgery;  Laterality: Right;  . SKIN BIOPSY Right 04/04/2018   right mid medial anterior tibial shave  see report in chart  . TEE WITHOUT CARDIOVERSION N/A 02/11/2016   Procedure: TRANSESOPHAGEAL ECHOCARDIOGRAM (TEE);  Surgeon: [name], MD;  Location: [facility];  Service: Cardiovascular;  Laterality: N/A;  . TEE WITHOUT CARDIOVERSION  N/A 10/27/2016   Procedure: TRANSESOPHAGEAL ECHOCARDIOGRAM (TEE);  Surgeon: [name], MD;  Location: [facility];  Service: Open Heart Surgery;  Laterality: N/A;  . TRIGGER FINGER RELEASE Bilateral     Family History  Problem Relation Age of Onset  . Cancer Mother        BREAST(BONE)  . Cancer Father         PANCREATIC  . Hypertension Maternal Grandmother   . Stroke Maternal Aunt   . Heart attack Neg Hx     Social History   Socioeconomic History  . Marital status: Single    Spouse name: Not on file  . Number of children: Not on file  . Years of education: Not on file  . Highest education level: Not on file  Occupational History  . Occupation: unemployed  Tobacco Use  . Smoking status: Current Every Lennon Smoker    Packs/[place]: 1.00    Years: 40.00    Pack years: 40.00    Types: Cigarettes, E-cigarettes  . Smokeless tobacco: Former Systems developer    Quit date: 1985  . Tobacco comment: vapes all Ditmars  Substance and Sexual Activity  . Alcohol use: Yes    Alcohol/week: 0.0 standard drinks    Comment: 1-2 bottles of red wine a Kush  . Drug use: No  . Sexual activity: Not Currently  Other Topics Concern  . Not on file  Social History Narrative   Epworth sleepiness scale as of 10/09/15 a 1   Social Determinants of Health   Financial Resource Strain:   . Difficulty of Paying Living Expenses: Not on file  Food Insecurity:   . Worried About Charity fundraiser in the Last Year: Not on file  . Ran Out of Food in the Last Year: Not on file  Transportation Needs:   . Lack of Transportation (Medical): Not on file  . Lack of Transportation (Non-Medical): Not on file  Physical Activity:   . Days of Exercise per Week: Not on file  . Minutes of Exercise per Session: Not on file  Stress:   . Feeling of Stress : Not on file  Social Connections:   . Frequency of Communication with Friends and Family: Not on file  . Frequency of Social Gatherings with Friends and Family: Not on file  . Attends Religious Services: Not on file  . Active Member of Clubs or Organizations: Not on file  . Attends Archivist Meetings: Not on file  . Marital Status: Not on file     Observations/Objective: Awake, alert and oriented x 3   Review of Systems  Constitutional: Negative for fever,  malaise/fatigue and weight loss.  HENT: Negative.  Negative for nosebleeds.   Eyes: Negative.  Negative for blurred vision, double vision and photophobia.  Respiratory: Negative.  Negative for cough and shortness of breath.   Cardiovascular: Negative.  Negative for chest pain, palpitations and leg swelling.  Gastrointestinal: Negative.  Negative for heartburn, nausea and vomiting.  Musculoskeletal: Negative.  Negative for myalgias.  Neurological: Negative.  Negative for dizziness, focal weakness, seizures and headaches.  Psychiatric/Behavioral: Negative for suicidal ideas. The patient is nervous/anxious and has insomnia.     Assessment and Plan: Azaiah was seen today for follow-up.  Diagnoses and all orders for this visit:  GAD (generalized anxiety disorder) -     busPIRone (BUSPAR) 15 MG tablet; Take 1 tablet (15 mg total) by mouth 3 (three) times daily.     Follow Up Instructions Return  in about 2 months (around 01/06/2020).     I discussed the assessment and treatment plan with the patient. The patient was provided an opportunity to ask questions and all were answered. The patient agreed with the plan and demonstrated an understanding of the instructions.   The patient was advised to call back or seek an in-person evaluation if the symptoms worsen or if the condition fails to improve as anticipated.  I provided 19 minutes of non-face-to-face time during this encounter including median intraservice time, reviewing previous notes, labs, imaging, medications and explaining diagnosis and management.  [name], FNP-BC

## 2019-11-14 ENCOUNTER — Ambulatory Visit: Payer: Self-pay | Admitting: Pharmacist

## 2019-11-15 ENCOUNTER — Telehealth: Payer: Self-pay | Admitting: Nurse Practitioner

## 2019-11-15 MED ORDER — WARFARIN SODIUM 2.5 MG PO TABS: ORAL_TABLET | ORAL | 2 refills | Status: DC

## 2019-11-15 NOTE — Telephone Encounter (Signed)
Refills sent

## 2019-11-15 NOTE — Telephone Encounter (Signed)
1) Medication(s) Requested (by name): -warfarin (COUMADIN) 2.5 MG tablet   2) Pharmacy of Choice: -[facility] ([place]), [place] - [address]  3) Special Requests:

## 2019-12-27 ENCOUNTER — Other Ambulatory Visit: Payer: Self-pay

## 2019-12-27 ENCOUNTER — Ambulatory Visit: Payer: Self-pay | Admitting: Nurse Practitioner

## 2019-12-28 ENCOUNTER — Ambulatory Visit: Payer: Self-pay | Admitting: Nurse Practitioner

## 2020-01-02 ENCOUNTER — Ambulatory Visit: Payer: Self-pay

## 2020-01-07 ENCOUNTER — Ambulatory Visit: Payer: Self-pay | Admitting: Nurse Practitioner

## 2020-01-11 ENCOUNTER — Inpatient Hospital Stay (HOSPITAL_COMMUNITY)
Admission: EM | Admit: 2020-01-11 | Discharge: 2020-01-18 | DRG: 475 | Disposition: A | Discharge status: Home Health | Payer: Medicaid Other | Attending: Family Medicine | Admitting: Family Medicine

## 2020-01-11 ENCOUNTER — Other Ambulatory Visit: Payer: Self-pay

## 2020-01-11 ENCOUNTER — Encounter (HOSPITAL_COMMUNITY): Payer: Self-pay

## 2020-01-11 ENCOUNTER — Emergency Department (HOSPITAL_COMMUNITY): Payer: Medicaid Other

## 2020-01-11 DIAGNOSIS — Z86711 Personal history of pulmonary embolism: Secondary | ICD-10-CM | POA: Diagnosis present

## 2020-01-11 DIAGNOSIS — Z803 Family history of malignant neoplasm of breast: Secondary | ICD-10-CM

## 2020-01-11 DIAGNOSIS — L089 Local infection of the skin and subcutaneous tissue, unspecified: Secondary | ICD-10-CM | POA: Diagnosis present

## 2020-01-11 DIAGNOSIS — D689 Coagulation defect, unspecified: Secondary | ICD-10-CM

## 2020-01-11 DIAGNOSIS — R52 Pain, unspecified: Secondary | ICD-10-CM

## 2020-01-11 DIAGNOSIS — Z20822 Contact with and (suspected) exposure to covid-19: Secondary | ICD-10-CM | POA: Diagnosis present

## 2020-01-11 DIAGNOSIS — Z9221 Personal history of antineoplastic chemotherapy: Secondary | ICD-10-CM

## 2020-01-11 DIAGNOSIS — Z800 Family history of malignant neoplasm of digestive organs: Secondary | ICD-10-CM

## 2020-01-11 DIAGNOSIS — F1010 Alcohol abuse, uncomplicated: Secondary | ICD-10-CM | POA: Diagnosis present

## 2020-01-11 DIAGNOSIS — I4891 Unspecified atrial fibrillation: Secondary | ICD-10-CM | POA: Diagnosis present

## 2020-01-11 DIAGNOSIS — I96 Gangrene, not elsewhere classified: Secondary | ICD-10-CM | POA: Diagnosis present

## 2020-01-11 DIAGNOSIS — Z823 Family history of stroke: Secondary | ICD-10-CM

## 2020-01-11 DIAGNOSIS — L03116 Cellulitis of left lower limb: Secondary | ICD-10-CM

## 2020-01-11 DIAGNOSIS — M869 Osteomyelitis, unspecified: Principal | ICD-10-CM | POA: Diagnosis present

## 2020-01-11 DIAGNOSIS — Z8572 Personal history of non-Hodgkin lymphomas: Secondary | ICD-10-CM

## 2020-01-11 DIAGNOSIS — L97529 Non-pressure chronic ulcer of other part of left foot with unspecified severity: Secondary | ICD-10-CM | POA: Diagnosis present

## 2020-01-11 DIAGNOSIS — I471 Supraventricular tachycardia: Secondary | ICD-10-CM

## 2020-01-11 DIAGNOSIS — Z9889 Other specified postprocedural states: Secondary | ICD-10-CM

## 2020-01-11 DIAGNOSIS — I110 Hypertensive heart disease with heart failure: Secondary | ICD-10-CM | POA: Diagnosis present

## 2020-01-11 DIAGNOSIS — Z7901 Long term (current) use of anticoagulants: Secondary | ICD-10-CM

## 2020-01-11 DIAGNOSIS — Z89412 Acquired absence of left great toe: Secondary | ICD-10-CM

## 2020-01-11 DIAGNOSIS — E860 Dehydration: Secondary | ICD-10-CM | POA: Diagnosis present

## 2020-01-11 DIAGNOSIS — M1990 Unspecified osteoarthritis, unspecified site: Secondary | ICD-10-CM | POA: Diagnosis present

## 2020-01-11 DIAGNOSIS — I5042 Chronic combined systolic (congestive) and diastolic (congestive) heart failure: Secondary | ICD-10-CM | POA: Diagnosis present

## 2020-01-11 DIAGNOSIS — F17210 Nicotine dependence, cigarettes, uncomplicated: Secondary | ICD-10-CM | POA: Diagnosis present

## 2020-01-11 DIAGNOSIS — I491 Atrial premature depolarization: Secondary | ICD-10-CM | POA: Diagnosis present

## 2020-01-11 DIAGNOSIS — I4510 Unspecified right bundle-branch block: Secondary | ICD-10-CM | POA: Diagnosis present

## 2020-01-11 DIAGNOSIS — Z8249 Family history of ischemic heart disease and other diseases of the circulatory system: Secondary | ICD-10-CM

## 2020-01-11 LAB — COMPREHENSIVE METABOLIC PANEL
ALT: 79 U/L — ABNORMAL HIGH (ref 0–44)
AST: 105 U/L — ABNORMAL HIGH (ref 15–41)
Albumin: 3.4 g/dL — ABNORMAL LOW (ref 3.5–5.0)
Alkaline Phosphatase: 57 U/L (ref 38–126)
Anion gap: 14 (ref 5–15)
BUN: 6 mg/dL (ref 6–20)
CO2: 22 mmol/L (ref 22–32)
Calcium: 8.7 mg/dL — ABNORMAL LOW (ref 8.9–10.3)
Chloride: 100 mmol/L (ref 98–111)
Creatinine, Ser: 0.88 mg/dL (ref 0.61–1.24)
GFR calc Af Amer: >60 mL/min (ref >60)
GFR calc non Af Amer: >60 mL/min (ref >60)
Glucose, Bld: 114 mg/dL — ABNORMAL HIGH (ref 70–99)
Potassium: 4.3 mmol/L (ref 3.5–5.1)
Sodium: 136 mmol/L (ref 135–145)
Total Bilirubin: 0.8 mg/dL (ref 0.3–1.2)
Total Protein: 6.6 g/dL (ref 6.5–8.1)

## 2020-01-11 LAB — PROTIME-INR
INR: 2.2 — ABNORMAL HIGH (ref 0.8–1.2)
Prothrombin Time: 24.5 seconds — ABNORMAL HIGH (ref 11.4–15.2)

## 2020-01-11 LAB — CBC WITH DIFFERENTIAL/PLATELET
Abs Immature Granulocytes: 0.01 10*3/uL (ref 0.00–0.07)
Basophils Absolute: 0 10*3/uL (ref 0.0–0.1)
Basophils Relative: 1 %
Eosinophils Absolute: 0 10*3/uL (ref 0.0–0.5)
Eosinophils Relative: 1 %
HCT: 41 % (ref 39.0–52.0)
Hemoglobin: 13.6 g/dL (ref 13.0–17.0)
Immature Granulocytes: 0 %
Lymphocytes Relative: 9 %
Lymphs Abs: 0.4 10*3/uL — ABNORMAL LOW (ref 0.7–4.0)
MCH: 27.5 pg (ref 26.0–34.0)
MCHC: 33.2 g/dL (ref 30.0–36.0)
MCV: 83 fL (ref 80.0–100.0)
Monocytes Absolute: 0.5 10*3/uL (ref 0.1–1.0)
Monocytes Relative: 12 %
Neutro Abs: 3.4 10*3/uL (ref 1.7–7.7)
Neutrophils Relative %: 77 %
Platelets: 209 10*3/uL (ref 150–400)
RBC: 4.94 MIL/uL (ref 4.22–5.81)
RDW: 16.4 % — ABNORMAL HIGH (ref 11.5–15.5)
WBC: 4.3 10*3/uL (ref 4.0–10.5)
nRBC: 0.9 % — ABNORMAL HIGH (ref 0.0–0.2)

## 2020-01-11 LAB — LACTIC ACID, PLASMA: Lactic Acid, Venous: 3.3 mmol/L (ref 0.5–1.9)

## 2020-01-11 MED ORDER — OXYCODONE HCL 5 MG PO TABS
5.0000 mg | ORAL_TABLET | ORAL | Status: DC | PRN
  Administered 2020-01-12 – 2020-01-18 (×13): 5 mg via ORAL
  Filled 2020-01-11 (×13): qty 1

## 2020-01-11 MED ORDER — LORAZEPAM 2 MG/ML IJ SOLN
1.0000 mg | INTRAMUSCULAR | Status: AC | PRN
  Administered 2020-01-12 (×2): 1 mg via INTRAVENOUS
  Filled 2020-01-11 (×2): qty 1

## 2020-01-11 MED ORDER — ONDANSETRON HCL 4 MG/2ML IJ SOLN
4.0000 mg | Freq: Once | INTRAMUSCULAR | Status: AC
  Administered 2020-01-11: 23:00:00 4 mg via INTRAVENOUS
  Filled 2020-01-11: qty 2

## 2020-01-11 MED ORDER — ACETAMINOPHEN 650 MG RE SUPP: 650.0000 mg | Freq: Four times a day (QID) | RECTAL | Status: DC | PRN

## 2020-01-11 MED ORDER — LORAZEPAM 1 MG PO TABS
1.0000 mg | ORAL_TABLET | ORAL | Status: AC | PRN
  Administered 2020-01-12: 1 mg via ORAL
  Administered 2020-01-12: 2 mg via ORAL
  Administered 2020-01-12 – 2020-01-14 (×8): 1 mg via ORAL
  Filled 2020-01-11 (×2): qty 1
  Filled 2020-01-11: qty 2
  Filled 2020-01-11 (×7): qty 1

## 2020-01-11 MED ORDER — HYDROMORPHONE HCL 1 MG/ML IJ SOLN
0.5000 mg | INTRAMUSCULAR | Status: DC | PRN
  Administered 2020-01-12 – 2020-01-17 (×4): 0.5 mg via INTRAVENOUS
  Filled 2020-01-11 (×4): qty 1

## 2020-01-11 MED ORDER — THIAMINE HCL 100 MG/ML IJ SOLN
100.0000 mg | Freq: Every day | INTRAMUSCULAR | Status: DC
  Administered 2020-01-12: 10:00:00 100 mg via INTRAVENOUS
  Filled 2020-01-11: qty 2

## 2020-01-11 MED ORDER — SODIUM CHLORIDE 0.9 % IV BOLUS
1000.0000 mL | Freq: Once | INTRAVENOUS | Status: AC
  Administered 2020-01-12: 1000 mL via INTRAVENOUS

## 2020-01-11 MED ORDER — THIAMINE HCL 100 MG PO TABS
100.0000 mg | ORAL_TABLET | Freq: Every day | ORAL | Status: DC
  Administered 2020-01-13 – 2020-01-14 (×2): 100 mg via ORAL
  Filled 2020-01-11 (×3): qty 1

## 2020-01-11 MED ORDER — ADULT MULTIVITAMIN W/MINERALS CH
1.0000 | ORAL_TABLET | Freq: Every day | ORAL | Status: DC
  Administered 2020-01-12 – 2020-01-14 (×3): 1 via ORAL
  Filled 2020-01-11 (×3): qty 1

## 2020-01-11 MED ORDER — ACETAMINOPHEN 325 MG PO TABS
650.0000 mg | ORAL_TABLET | Freq: Four times a day (QID) | ORAL | Status: DC | PRN
  Administered 2020-01-17: 20:00:00 650 mg via ORAL
  Filled 2020-01-11: qty 2

## 2020-01-11 MED ORDER — ONDANSETRON HCL 4 MG/2ML IJ SOLN: 4.0000 mg | Freq: Four times a day (QID) | INTRAMUSCULAR | Status: DC | PRN

## 2020-01-11 MED ORDER — HYDROMORPHONE HCL 1 MG/ML IJ SOLN
0.5000 mg | Freq: Once | INTRAMUSCULAR | Status: AC
  Administered 2020-01-11: 0.5 mg via INTRAVENOUS
  Filled 2020-01-11: qty 1

## 2020-01-11 MED ORDER — FOLIC ACID 1 MG PO TABS
1.0000 mg | ORAL_TABLET | Freq: Every day | ORAL | Status: DC
  Administered 2020-01-12 – 2020-01-14 (×3): 1 mg via ORAL
  Filled 2020-01-11 (×3): qty 1

## 2020-01-11 MED ORDER — NICOTINE 21 MG/24HR TD PT24
21.0000 mg | MEDICATED_PATCH | Freq: Every day | TRANSDERMAL | Status: DC
  Administered 2020-01-12 – 2020-01-18 (×8): 21 mg via TRANSDERMAL
  Filled 2020-01-11 (×8): qty 1

## 2020-01-11 MED ORDER — ONDANSETRON HCL 4 MG PO TABS: 4.0000 mg | ORAL_TABLET | Freq: Four times a day (QID) | ORAL | Status: DC | PRN

## 2020-01-11 MED ORDER — SODIUM CHLORIDE 0.9 % IV SOLN: INTRAVENOUS | Status: AC

## 2020-01-11 MED ORDER — METOPROLOL SUCCINATE ER 50 MG PO TB24
50.0000 mg | ORAL_TABLET | Freq: Every day | ORAL | Status: DC
  Administered 2020-01-12 – 2020-01-18 (×7): 50 mg via ORAL
  Filled 2020-01-11 (×2): qty 1
  Filled 2020-01-11: qty 2
  Filled 2020-01-11 (×5): qty 1

## 2020-01-11 NOTE — ED Triage Notes (Signed)
Pt arrives via [facility] for CC of L foot wound infection. Pt had toe amputation approx 10 months ago due to neuropathy and noticed clear oozing and bleeding from site. Pt endorses increased weakness, SOB, and R elbow pain. VSS en route. GCS15, aox4.

## 2020-01-11 NOTE — H&P (Signed)
History and Physical    Leonard Bentley YTK:[id number] DOB: 11-12-1960 DOA: 01/11/2020  PCP: Leonard Pounds, NP  Patient coming from: Home  I have personally briefly reviewed patient's old medical records in [place]  Chief Complaint: Left foot infection  HPI: Leonard Bentley is a 59 y.o. male with medical history significant for severe mitral regurgitation s/p mitral valve repair and recurrent PE on Coumadin, history of SVT/PAT/PACs, chronic combined systolic and diastolic CHF (EF improved to 55-60%), history of stage II non-Hodgkin's lymphoma s/p chemotherapy with CHOP/Rituxan on observation, left first toe osteomyelitis s/p left great toe amputation, depression/anxiety, alcohol use, and tobacco use who presents to the ED for evaluation of left foot infection.  Patient reports progressive pain on his left foot.  He has noticed change in appearance of his left second toe with clear drainage.  He has been having difficulty ambulating because of the pain.  He is feeling generally weak and tired.  He says he becomes fatigued easily after walking for short time.  He denies any fevers but reports chills.  He denies any chest pain, cough, nausea, vomiting, abdominal pain, dysuria, diarrhea.  He is on Coumadin for his history of mitral valve repair and prior PE.  He reports falling on his right elbow 3 months ago with continued pain whenever he puts pressure on his elbow.  He reports smoking 1 pack/[place] of cigarettes.  He reports drinking at least 1 bottle of wine daily with last drink 12 hours ago.  ED Course:  Initial vitals showed BP 130/85, pulse 84, RR 20, temp 98.5 Fahrenheit, SPO2 99% on room air.  Labs notable for WBC 4.3, hemoglobin 13.6, platelets 209,000, sodium 136, potassium 4.3, bicarb 22, BUN 6, creatinine 0.88, AST 105, ALT 79, alk phos 57, total bilirubin 0.8, serum glucose 114, lactic acid 3.3, INR 2.2.  Blood cultures were obtained and pending.  SARS-CoV-2 PCR was ordered and  pending.  Left foot x-ray showed prior amputation of the great toe at the MTP joint with some rare fraction of the distal aspect of the MTP without focal destruction.  2 view chest x-ray showed prior mitral valve replacement without active disease.  Right elbow x-ray was negative for evidence of fracture, dislocation, or joint effusion.  Patient was given IV Zofran and Dilaudid.  Hospitalist service was consulted admit for further evaluation and management.  Review of Systems: All systems reviewed and are negative except as documented in history of present illness above.   Past Medical History:  Diagnosis Date  . Acute pulmonary embolism ([place]) 04/11/2016  . Anxiety   . Arthritis   . Atrial tachycardia ([place]) 01/06/2016  . Depression   . DTs (delirium tremens) ([place])   . Dyspnea   . ED (erectile dysfunction)   . ETOH abuse   . Hypertension   . Lymphoma, non Hodgkin's 09/25/2011   Stage 2  . Mitral regurgitation 01/20/2016  . Mobitz I 10/09/2015  . Murmur 01/06/2016  . Occasional tremors   . PAC (premature atrial contraction) 10/09/2015  . S/P minimally-invasive mitral valve repair 10/27/2016   Complex valvuloplasty including artificial Gore-tex neochord placement x6 and 34 mm Edwards Physio II ring annuloplasty via right mini thoracotomy approach    Past Surgical History:  Procedure Laterality Date  . AMPUTATION Left 01/12/2019   Procedure: LEFT GREAT TOE AMPUTATION;  Surgeon: [name], MD;  Location: [facility];  Service: Orthopedics;  Laterality: Left;  . CARDIAC CATHETERIZATION N/A 08/13/2016  Procedure: Right/Left Heart Cath and Coronary Angiography;  Surgeon: [name], MD;  Location: [facility];  Service: Cardiovascular;  Laterality: N/A;  . MITRAL VALVE REPAIR Right 10/27/2016   Procedure: MINIMALLY INVASIVE MITRAL VALVE REPAIR (MVR) USING 34 PHYSIO II ANNULOPLASTY RING;  Surgeon: [name], MD;  Location: [facility];  Service: Open Heart Surgery;   Laterality: Right;  . SKIN BIOPSY Right 04/04/2018   right mid medial anterior tibial shave  see report in chart  . TEE WITHOUT CARDIOVERSION N/A 02/11/2016   Procedure: TRANSESOPHAGEAL ECHOCARDIOGRAM (TEE);  Surgeon: [name], MD;  Location: [facility];  Service: Cardiovascular;  Laterality: N/A;  . TEE WITHOUT CARDIOVERSION N/A 10/27/2016   Procedure: TRANSESOPHAGEAL ECHOCARDIOGRAM (TEE);  Surgeon: [name], MD;  Location: [facility];  Service: Open Heart Surgery;  Laterality: N/A;  . TRIGGER FINGER RELEASE Bilateral     Social History:  reports that he has been smoking cigarettes and e-cigarettes. He has a 40.00 pack-year smoking history. He quit smokeless tobacco use about 36 years ago. He reports current alcohol use. He reports that he does not use drugs.  Allergies  Allergen Reactions  . Other Other (See Comments)    "Cactus" caused blisters on tongue (if prepared as food)    Family History  Problem Relation Age of Onset  . Cancer Mother        BREAST(BONE)  . Cancer Father        PANCREATIC  . Hypertension Maternal Grandmother   . Stroke Maternal Aunt   . Heart attack Neg Hx      Prior to Admission medications   Medication Sig Start Date End Date Taking? Authorizing Provider  metoprolol succinate (TOPROL-XL) 50 MG 24 hr tablet Take 1 tablet (50 mg total) by mouth daily. Take with or immediately following a meal. 03/06/19  Yes Leonard Pounds, NP  warfarin (COUMADIN) 2.5 MG tablet TAKE ONE TABLET BY MOUTH DAILY AS DIRECTED BY THE COUMADIN CLINIC. Patient taking differently: Take 2.5 mg by mouth every evening.  11/15/19  Yes Charlott Rakes, MD  diclofenac sodium (VOLTAREN) 1 % GEL Apply 2 g topically 4 (four) times daily as needed. Patient not taking: Reported on 01/11/2020 08/03/19   Leonard Pounds, NP    Physical Exam: Vitals:   01/11/20 1925 01/11/20 1930 01/11/20 1931 01/11/20 2115  BP:  138/85  (!) 132/96  Pulse:  83  91  Resp:    18  Temp:  98.5 F  (36.9 C)    TempSrc:  Oral    SpO2: 98% 99%  97%  Weight:   90.3 kg   Height:   _0  (1.753 m)    Constitutional: Resting supine in bed, appears anxious Eyes: PERRL, lids and conjunctivae normal ENMT: Mucous membranes are dry. Posterior pharynx clear of any exudate or lesions.Normal dentition.  Neck: normal, supple, no masses. Respiratory: clear to auscultation bilaterally, no wheezing, no crackles. Normal respiratory effort. No accessory muscle use.  Cardiovascular: Irregularly irregular, no murmurs / rubs / gallops. No extremity edema. 2+ pedal pulses. Abdomen: no tenderness, no masses palpated. No hepatosplenomegaly. Bowel sounds positive.  Musculoskeletal: S/p left first toe amputation, necrotic appearing left second toe Skin: Infected left second toe, necrotic appearing.  Not actively draining. Neurologic: Slightly tremulous, CN 2-12 grossly intact. Sensation diminished at the toes, Strength 5/5 in all 4.  Psychiatric: Alert and oriented x 3.  Appears anxious.        Labs on Admission: I  have personally reviewed following labs and imaging studies  CBC: Recent Labs  Lab 01/11/20 2230  WBC 4.3  NEUTROABS 3.4  HGB 13.6  HCT 41.0  MCV 83.0  PLT 333   Basic Metabolic Panel: Recent Labs  Lab 01/11/20 2230  NA 136  K 4.3  CL 100  CO2 22  GLUCOSE 114*  BUN 6  CREATININE 0.88  CALCIUM 8.7*   GFR: Estimated Creatinine Clearance: 99.1 mL/min (by C-G formula based on SCr of 0.88 mg/dL). Liver Function Tests: Recent Labs  Lab 01/11/20 2230  AST 105*  ALT 79*  ALKPHOS 57  BILITOT 0.8  PROT 6.6  ALBUMIN 3.4*   No results for input(s): LIPASE, AMYLASE in the last 168 hours. No results for input(s): AMMONIA in the last 168 hours. Coagulation Profile: Recent Labs  Lab 01/11/20 2230  INR 2.2*   Cardiac Enzymes: No results for input(s): CKTOTAL, CKMB, CKMBINDEX, TROPONINI in the last 168 hours. BNP (last 3 results) No results for input(s): PROBNP in the  last 8760 hours. HbA1C: No results for input(s): HGBA1C in the last 72 hours. CBG: No results for input(s): GLUCAP in the last 168 hours. Lipid Profile: No results for input(s): CHOL, HDL, LDLCALC, TRIG, CHOLHDL, LDLDIRECT in the last 72 hours. Thyroid Function Tests: No results for input(s): TSH, T4TOTAL, FREET4, T3FREE, THYROIDAB in the last 72 hours. Anemia Panel: No results for input(s): VITAMINB12, FOLATE, FERRITIN, TIBC, IRON, RETICCTPCT in the last 72 hours. Urine analysis:    Component Value Date/Time   COLORURINE RED (A) 11/01/2018 2149   APPEARANCEUR Clear 08/03/2019 1143   LABSPEC 1.018 11/01/2018 2149   PHURINE 7.0 11/01/2018 2149   GLUCOSEU Negative 08/03/2019 1143   HGBUR MODERATE (A) 11/01/2018 2149   BILIRUBINUR small (A) 08/03/2019 1144   BILIRUBINUR Negative 08/03/2019 1143   KETONESUR small (15) (A) 08/03/2019 [place] 11/01/2018 2149   PROTEINUR 2+ (A) 08/03/2019 1143   PROTEINUR 100 (A) 11/01/2018 2149   UROBILINOGEN 1.0 08/03/2019 1144   UROBILINOGEN 1.0 01/25/2013 0825   NITRITE Negative 08/03/2019 1144   NITRITE Negative 08/03/2019 1143   NITRITE NEGATIVE 11/01/2018 2149   LEUKOCYTESUR Negative 08/03/2019 1144   LEUKOCYTESUR Negative 08/03/2019 1143    Radiological Exams on Admission: DG Chest 2 View  Result Date: 01/11/2020 CLINICAL DATA:  Shortness of breath.  Foot infection. EXAM: CHEST - 2 VIEW COMPARISON:  09/28/2019 FINDINGS: Previous mitral valve replacement. Heart size is normal. Aorta is tortuous. Chronic lung markings are again demonstrated, but without evidence of active infiltrate, effusion or collapse. No bone abnormality. IMPRESSION: No active disease.  Previous mitral valve replacement. Electronically Signed   By: Nelson Chimes M.D.   On: 01/11/2020 22:01   DG ELBOW COMPLETE RIGHT (3+VIEW)  Result Date: 01/11/2020 CLINICAL DATA:  Foot infection.  Elbow pain. EXAM: RIGHT ELBOW - COMPLETE 3+ VIEW COMPARISON:  None. FINDINGS:  There is no evidence of fracture, dislocation, or joint effusion. There is no evidence of arthropathy or other focal bone abnormality. Soft tissues are unremarkable. IMPRESSION: Negative. Electronically Signed   By: Nelson Chimes M.D.   On: 01/11/2020 22:02   DG Foot Complete Left  Result Date: 01/11/2020 CLINICAL DATA:  Foot infection. Toe amputation 10 months ago. Oozing and bleeding from the amputation site. EXAM: LEFT FOOT - COMPLETE 3+ VIEW COMPARISON:  01/09/2019 FINDINGS: Amputation of the great toe at the metatarsal phalangeal joint. Some degree of rare fraction of the distal metatarsal, which could be due to  disuse osteopenia. No clear destructive focus to allow diagnosis of osteomyelitis. The other toes appear normal. The more proximal foot appears normal. IMPRESSION: Amputation of the great toe at the MTP joint. Some rare fraction of the distal aspect of the MTP, but without focal destruction. Cannot definitely establish osteomyelitis based on this appearance. Electronically Signed   By: Nelson Chimes M.D.   On: 01/11/2020 22:04    EKG: Independently reviewed. Tachyarrhythmia with RBBB and LAFB.  Similar to prior.  Assessment/Plan Principal Problem:   Left foot infection Active Problems:   History of pulmonary embolus (PE)   S/P minimally-invasive mitral valve repair   Chronic combined systolic and diastolic heart failure (HCC)  Leonard Bentley is a 59 y.o. male with medical history significant for severe mitral regurgitation s/p mitral valve repair and recurrent PE on Coumadin, history of SVT/PAT/PACs, chronic combined systolic and diastolic CHF (EF improved to 55-60%), history of stage II non-Hodgkin's lymphoma s/p chemotherapy with CHOP/Rituxan on observation, left first toe osteomyelitis s/p left great toe amputation, depression/anxiety, alcohol use, and tobacco use who is admitted with left foot infection.  Left foot infection at second toe/s/p left great toe amputation: Necrotic  appearing left second toe with questionable osteomyelitis.  Overall is hemodynamically stable without systemic infection therefore will withhold antibiotics for now.  Will evaluate further with CT imaging (patient states he would not tolerate MRI). Prior left 1st toe amputation performed by Dr. [name] on 01/12/2019. -Obtain CT left foot -Hold off on antibiotics for now -Continue as needed pain control -Elevated lactic acid, will give 1 L normal saline bolus followed by gentle fluids overnight and recheck in a.m. -Will need to notify orthopedics, Dr. [name] in AM  Severe mitral regurgitation s/p mitral valve repair History of recurrent pulmonary emboli: On Coumadin, INR is 2.2.  Will hold Coumadin for now in case of need for surgical intervention.  History of SVT/PAT/PACs: Initial tachyarrhythmia on arrival, rate improving.  Continue home Toprol-XL and IV fluid hydration as he appears dehydrated.  Chronic combined systolic and diastolic CHF: EF improved to 55-60%, he is no longer requiring diuretic therapy.  He is overall volume depleted.  Continue IV fluids as above.  Alcohol use: Reports drinking at least 1 bottle of wine nightly with last drink 12 hours prior to admission.  He is somewhat tremulous and anxious. -Place on CIWA protocol with as needed Ativan due to risk of withdrawal  History of stage II non-Hodgkin's lymphoma: S/p chemotherapy with CHOP/Rituxan currently in observation.  Follows with oncology, Dr. Lorna Few.  Tobacco use: Smokes 1 pack/[place].  Advised on cutting back and eventual cessation.  Nicotine patch ordered.  DVT prophylaxis: SCDs Code Status: Full code, confirmed with patient Family Communication: Discussed with patient, he has discussed with family Disposition Plan: From home, discharge pending further evaluation of left foot infection and possible need for surgical intervention. Consults called: Will need to notify orthopedics, Dr. [name] in AM. Admission  status: Observation   Zada Finders MD [facility]  If 7PM-7AM, please contact night-coverage [email or web address]  01/12/2020, 12:07 AM

## 2020-01-11 NOTE — ED Provider Notes (Signed)
[facility] EMERGENCY DEPARTMENT Provider Note   CSN: [id number] Arrival date & time:        History Chief Complaint  Patient presents with  . Wound Infection    Leonard Bentley is a 59 y.o. male.  Patient with history of osteomyelitis of the great left toe status post amputation (01/12/19), history of PE on warfarin, mitral valve repair, alcohol abuse --presents to the emergency department tonight by EMS with complaint of drainage and discharge which has become more pronounced at the second toe and previous operative site on the left foot and leg.  Patient denies any fevers but has been having chills.  He has some tenderness up into his ankle.  He states that he has been feeling generally unwell and weak.  In addition to this he reports increasing shortness of breath with ambulation.  No chest pain.  He does state he is compliant with Coumadin but also states that he has not had his levels checked in several months.  He complains of some pain in his right elbow since an injury several days ago, however can flex and extend at the elbow.  No treatments prior to arrival.  Symptoms are acute on chronic.  Gradually worsening.        Past Medical History:  Diagnosis Date  . Acute pulmonary embolism ([place]) 04/11/2016  . Anxiety   . Arthritis   . Atrial tachycardia ([place]) 01/06/2016  . Depression   . DTs (delirium tremens) ([place])   . Dyspnea   . ED (erectile dysfunction)   . ETOH abuse   . Hypertension   . Lymphoma, non Hodgkin's 09/25/2011   Stage 2  . Mitral regurgitation 01/20/2016  . Mobitz I 10/09/2015  . Murmur 01/06/2016  . Occasional tremors   . PAC (premature atrial contraction) 10/09/2015  . S/P minimally-invasive mitral valve repair 10/27/2016   Complex valvuloplasty including artificial Gore-tex neochord placement x6 and 34 mm Edwards Physio II ring annuloplasty via right mini thoracotomy approach    Patient Active Problem List   Diagnosis Date Noted  . Chronic  combined systolic and diastolic heart failure ([place]) 03/06/2019  . Coagulation disorder ([place]) 03/06/2019  . Impacted cerumen of both ears 02/09/2019  . Osteomyelitis of great toe of left foot ([place]) 01/09/2019  . Foot ulcer, left ([place]) 12/27/2018  . Cellulitis 12/07/2018  . Bleeding from left ear 11/28/2018  . HTN (hypertension) 11/02/2018  . UTI (urinary tract infection) 11/02/2018  . Ecchymosis   . Tachyarrhythmia 08/30/2018  . Hypomagnesemia 08/30/2018  . Recurrent pulmonary emboli ([place])   . Hematuria   . Delirium tremens ([place])   . LFTs abnormal   . Alcoholic hepatitis without ascites   . Alcohol withdrawal ([place]) 06/04/2018  . Dizziness   . Posterior tibial tendon dysfunction 02/21/2018  . Supratherapeutic INR 11/27/2016  . Dehydration 11/27/2016  . SVT (supraventricular tachycardia) ([place])   . S/P minimally-invasive mitral valve repair 10/27/2016  . History of pulmonary embolus (PE) 04/11/2016  . Left sided numbness   . Anxiety 04/02/2016  . Panic attacks 04/02/2016  . Mitral valve prolapse   . Mitral regurgitation 01/20/2016  . Atrial tachycardia ([place]) 01/06/2016  . Murmur 01/06/2016  . Mobitz I 10/09/2015  . PAC (premature atrial contraction) 10/09/2015  . Arthritis 08/14/2015  . Neuropathy due to chemotherapeutic drug ([place]) 03/09/2013  . Leucopenia 01/31/2013  . Personal history of pulmonary embolism 12/28/2012  . Alcohol abuse 08/24/2012  . Cigarette smoker 08/24/2012  . Hemorrhoid 03/08/2012  .  Anemia 12/10/2011  . History of non-Hodgkin's lymphoma 09/25/2011    Past Surgical History:  Procedure Laterality Date  . AMPUTATION Left 01/12/2019   Procedure: LEFT GREAT TOE AMPUTATION;  Surgeon: [name], MD;  Location: [facility];  Service: Orthopedics;  Laterality: Left;  . CARDIAC CATHETERIZATION N/A 08/13/2016   Procedure: Right/Left Heart Cath and Coronary Angiography;  Surgeon: [name], MD;  Location: [facility];  Service: Cardiovascular;   Laterality: N/A;  . MITRAL VALVE REPAIR Right 10/27/2016   Procedure: MINIMALLY INVASIVE MITRAL VALVE REPAIR (MVR) USING 34 PHYSIO II ANNULOPLASTY RING;  Surgeon: [name], MD;  Location: [facility];  Service: Open Heart Surgery;  Laterality: Right;  . SKIN BIOPSY Right 04/04/2018   right mid medial anterior tibial shave  see report in chart  . TEE WITHOUT CARDIOVERSION N/A 02/11/2016   Procedure: TRANSESOPHAGEAL ECHOCARDIOGRAM (TEE);  Surgeon: [name], MD;  Location: [facility];  Service: Cardiovascular;  Laterality: N/A;  . TEE WITHOUT CARDIOVERSION N/A 10/27/2016   Procedure: TRANSESOPHAGEAL ECHOCARDIOGRAM (TEE);  Surgeon: [name], MD;  Location: [facility];  Service: Open Heart Surgery;  Laterality: N/A;  . TRIGGER FINGER RELEASE Bilateral        Family History  Problem Relation Age of Onset  . Cancer Mother        BREAST(BONE)  . Cancer Father        PANCREATIC  . Hypertension Maternal Grandmother   . Stroke Maternal Aunt   . Heart attack Neg Hx     Social History   Tobacco Use  . Smoking status: Current Every [employer] Smoker    Packs/Fasching: 1.00    Years: 40.00    Pack years: 40.00    Types: Cigarettes, E-cigarettes  . Smokeless tobacco: Former Systems developer    Quit date: 1985  . Tobacco comment: vapes all [place]  Substance Use Topics  . Alcohol use: Yes    Alcohol/week: 0.0 standard drinks    Comment: 1-2 bottles of red wine a [place]  . Drug use: No    Home Medications Prior to Admission medications   Medication Sig Start Date End Date Taking? Authorizing Provider  acetaminophen (TYLENOL) 650 MG CR tablet Take 1,300 mg by mouth every 8 (eight) hours as needed for pain.     [provider]  diclofenac sodium (VOLTAREN) 1 % GEL Apply 2 g topically 4 (four) times daily as needed. Patient taking differently: Apply 2 g topically 4 (four) times daily as needed (Feet pain).  08/03/19   [name], NP  metoprolol succinate (TOPROL-XL) 50 MG 24 hr tablet Take 1  tablet (50 mg total) by mouth daily. Take with or immediately following a meal. 03/06/19   [name], NP  warfarin (COUMADIN) 2.5 MG tablet TAKE ONE TABLET BY MOUTH DAILY AS DIRECTED BY THE COUMADIN CLINIC. 11/15/19   Charlott Rakes, MD    Allergies    Other  Review of Systems   Review of Systems  Constitutional: Positive for chills. Negative for fever.  HENT: Negative for rhinorrhea and sore throat.   Eyes: Negative for redness.  Respiratory: Positive for shortness of breath. Negative for cough.   Cardiovascular: Negative for chest pain.  Gastrointestinal: Negative for abdominal pain, diarrhea, nausea and vomiting.  Genitourinary: Negative for dysuria.  Musculoskeletal: Positive for arthralgias and myalgias.  Skin: Positive for color change. Negative for rash.  Neurological: Negative for headaches.    Physical Exam Updated Vital Signs BP 138/85 (BP  Location: Right Arm)   Pulse 83   Temp 98.5 F (36.9 C) (Oral)   Ht 5\' 9"  (1.753 m)   Wt 90.3 kg   SpO2 99%   BMI 29.39 kg/m   Physical Exam Vitals and nursing note reviewed.  Constitutional:      Appearance: He is well-developed.  HENT:     Head: Normocephalic and atraumatic.  Eyes:     General:        Right eye: No discharge.        Left eye: No discharge.     Conjunctiva/sclera: Conjunctivae normal.  Cardiovascular:     Rate and Rhythm: Normal rate and regular rhythm.     Heart sounds: Normal heart sounds.  Pulmonary:     Effort: Pulmonary effort is normal.     Breath sounds: Normal breath sounds.  Abdominal:     Palpations: Abdomen is soft.     Tenderness: There is no abdominal tenderness.  Musculoskeletal:     Right elbow: Normal range of motion. Tenderness present.     Cervical back: Normal range of motion and neck supple.     Comments: Patient with previous left great toe amputation.  Patient has erythema and redness, foul odor, swelling to the second toe, with some dried crusting discharge.  Patient has  erythema and warmth across the midfoot and up into the ankle.  No lymphangitis.  Necrotic changes noted at the tip of the swollen second toe.  There is some associated skin breakdown volarly.  Skin:    General: Skin is warm and dry.  Neurological:     Mental Status: He is alert.            ED Results / Procedures / Treatments   Labs (all labs ordered are listed, but only abnormal results are displayed) Labs Reviewed  CBC WITH DIFFERENTIAL/PLATELET - Abnormal; Notable for the following components:      Result Value   RDW 16.4 (*)    nRBC 0.9 (*)    Lymphs Abs 0.4 (*)    All other components within normal limits  COMPREHENSIVE METABOLIC PANEL - Abnormal; Notable for the following components:   Glucose, Bld 114 (*)    Calcium 8.7 (*)    Albumin 3.4 (*)    AST 105 (*)    ALT 79 (*)    All other components within normal limits  LACTIC ACID, PLASMA - Abnormal; Notable for the following components:   Lactic Acid, Venous 3.3 (*)    All other components within normal limits  PROTIME-INR - Abnormal; Notable for the following components:   Prothrombin Time 24.5 (*)    INR 2.2 (*)    All other components within normal limits  CULTURE, BLOOD (ROUTINE X 2)  CULTURE, BLOOD (ROUTINE X 2)  SARS CORONAVIRUS 2 (TAT 6-24 HRS)    EKG EKG Interpretation  Date/Time:  Friday January 11 2020 20:44:59 EST Ventricular Rate:  116 PR Interval:    QRS Duration: 146 QT Interval:  433 QTC Calculation: 533 R Axis:   -94 Text Interpretation: Atrial fibrillation RBBB and LAFB Confirmed by Lacretia Leigh ([id number]) on 01/11/2020 9:24:07 PM         Radiology DG Chest 2 View  Result Date: 01/11/2020 CLINICAL DATA:  Shortness of breath.  Foot infection. EXAM: CHEST - 2 VIEW COMPARISON:  09/28/2019 FINDINGS: Previous mitral valve replacement. Heart size is normal. Aorta is tortuous. Chronic lung markings are again demonstrated, but without evidence of active infiltrate, effusion  or collapse. No  bone abnormality. IMPRESSION: No active disease.  Previous mitral valve replacement. Electronically Signed   By: Nelson Chimes M.D.   On: 01/11/2020 22:01   DG ELBOW COMPLETE RIGHT (3+VIEW)  Result Date: 01/11/2020 CLINICAL DATA:  Foot infection.  Elbow pain. EXAM: RIGHT ELBOW - COMPLETE 3+ VIEW COMPARISON:  None. FINDINGS: There is no evidence of fracture, dislocation, or joint effusion. There is no evidence of arthropathy or other focal bone abnormality. Soft tissues are unremarkable. IMPRESSION: Negative. Electronically Signed   By: Nelson Chimes M.D.   On: 01/11/2020 22:02   DG Foot Complete Left  Result Date: 01/11/2020 CLINICAL DATA:  Foot infection. Toe amputation 10 months ago. Oozing and bleeding from the amputation site. EXAM: LEFT FOOT - COMPLETE 3+ VIEW COMPARISON:  01/09/2019 FINDINGS: Amputation of the great toe at the metatarsal phalangeal joint. Some degree of rare fraction of the distal metatarsal, which could be due to disuse osteopenia. No clear destructive focus to allow diagnosis of osteomyelitis. The other toes appear normal. The more proximal foot appears normal. IMPRESSION: Amputation of the great toe at the MTP joint. Some rare fraction of the distal aspect of the MTP, but without focal destruction. Cannot definitely establish osteomyelitis based on this appearance. Electronically Signed   By: Nelson Chimes M.D.   On: 01/11/2020 22:04    Procedures Procedures (including critical care time)  Medications Ordered in ED Medications  HYDROmorphone (DILAUDID) injection 0.5 mg (0.5 mg Intravenous Given 01/11/20 2315)  ondansetron (ZOFRAN) injection 4 mg (4 mg Intravenous Given 01/11/20 2313)    ED Course  I have reviewed the triage vital signs and the nursing notes.  Pertinent labs & imaging results that were available during my care of the patient were reviewed by me and considered in my medical decision making (see chart for details).  Patient seen and examined. Work-up  initiated.  Vital signs reviewed and are as follows: BP 138/85 (BP Location: Right Arm)   Pulse 83   Temp 98.5 F (36.9 C) (Oral)   Ht 5\' 9"  (1.753 m)   Wt 90.3 kg   SpO2 99%   BMI 29.39 kg/m   Patient discussed with and seen by Dr. Zenia Resides.  Patient will need admission for evaluation of gangrenous second toe and further evaluation and work-up for osteomyelitis, although initial plain film imaging was negative.  11:27 PM Spoke with Dr. Posey Pronto of [facility] hospitalist who will see.        MDM Rules/Calculators/A&P                      Admit for cellulitis of the foot, possible osteomyelitis.   Final Clinical Impression(s) / ED Diagnoses Final diagnoses:  Cellulitis of left foot    Rx / DC Orders ED Discharge Orders    None       Carlisle Cater, Hershal Coria 01/11/20 2344    Lacretia Leigh, MD 01/15/20 1442

## 2020-01-11 NOTE — ED Provider Notes (Signed)
Medical screening examination/treatment/procedure(s) were conducted as a shared visit with non-physician practitioner(s) and myself.  I personally evaluated the patient during the encounter.  EKG Interpretation  Date/Time:  Friday January 11 2020 20:44:59 EST Ventricular Rate:  116 PR Interval:    QRS Duration: 146 QT Interval:  433 QTC Calculation: 533 R Axis:   -94 Text Interpretation: Atrial fibrillation RBBB and LAFB Confirmed by Lacretia Leigh ([id number]) on 01/11/2020 9:15:51 PM 59 year old male presents with left foot infection.  Does have a necrotic second toe on that foot.  Suspect gangrene.  Labs are pending and will likely admit   Lacretia Leigh, MD 01/11/20 2232

## 2020-01-12 ENCOUNTER — Encounter (HOSPITAL_COMMUNITY): Payer: Self-pay | Admitting: Internal Medicine

## 2020-01-12 ENCOUNTER — Observation Stay (HOSPITAL_COMMUNITY): Payer: Medicaid Other

## 2020-01-12 DIAGNOSIS — I491 Atrial premature depolarization: Secondary | ICD-10-CM | POA: Diagnosis present

## 2020-01-12 DIAGNOSIS — M869 Osteomyelitis, unspecified: Secondary | ICD-10-CM | POA: Diagnosis present

## 2020-01-12 DIAGNOSIS — Z800 Family history of malignant neoplasm of digestive organs: Secondary | ICD-10-CM | POA: Diagnosis not present

## 2020-01-12 DIAGNOSIS — Z9221 Personal history of antineoplastic chemotherapy: Secondary | ICD-10-CM | POA: Diagnosis not present

## 2020-01-12 DIAGNOSIS — Z89412 Acquired absence of left great toe: Secondary | ICD-10-CM | POA: Diagnosis not present

## 2020-01-12 DIAGNOSIS — E860 Dehydration: Secondary | ICD-10-CM | POA: Diagnosis present

## 2020-01-12 DIAGNOSIS — Z823 Family history of stroke: Secondary | ICD-10-CM | POA: Diagnosis not present

## 2020-01-12 DIAGNOSIS — I110 Hypertensive heart disease with heart failure: Secondary | ICD-10-CM | POA: Diagnosis present

## 2020-01-12 DIAGNOSIS — Z8572 Personal history of non-Hodgkin lymphomas: Secondary | ICD-10-CM | POA: Diagnosis not present

## 2020-01-12 DIAGNOSIS — Z803 Family history of malignant neoplasm of breast: Secondary | ICD-10-CM | POA: Diagnosis not present

## 2020-01-12 DIAGNOSIS — Z20822 Contact with and (suspected) exposure to covid-19: Secondary | ICD-10-CM | POA: Diagnosis present

## 2020-01-12 DIAGNOSIS — Z7901 Long term (current) use of anticoagulants: Secondary | ICD-10-CM | POA: Diagnosis not present

## 2020-01-12 DIAGNOSIS — I5042 Chronic combined systolic (congestive) and diastolic (congestive) heart failure: Secondary | ICD-10-CM | POA: Diagnosis present

## 2020-01-12 DIAGNOSIS — I4891 Unspecified atrial fibrillation: Secondary | ICD-10-CM | POA: Diagnosis present

## 2020-01-12 DIAGNOSIS — M1990 Unspecified osteoarthritis, unspecified site: Secondary | ICD-10-CM | POA: Diagnosis present

## 2020-01-12 DIAGNOSIS — I96 Gangrene, not elsewhere classified: Secondary | ICD-10-CM | POA: Diagnosis present

## 2020-01-12 DIAGNOSIS — F17210 Nicotine dependence, cigarettes, uncomplicated: Secondary | ICD-10-CM | POA: Diagnosis present

## 2020-01-12 DIAGNOSIS — I4510 Unspecified right bundle-branch block: Secondary | ICD-10-CM | POA: Diagnosis present

## 2020-01-12 DIAGNOSIS — Z8249 Family history of ischemic heart disease and other diseases of the circulatory system: Secondary | ICD-10-CM | POA: Diagnosis not present

## 2020-01-12 DIAGNOSIS — R52 Pain, unspecified: Secondary | ICD-10-CM | POA: Diagnosis present

## 2020-01-12 DIAGNOSIS — I471 Supraventricular tachycardia: Secondary | ICD-10-CM | POA: Diagnosis present

## 2020-01-12 DIAGNOSIS — L97529 Non-pressure chronic ulcer of other part of left foot with unspecified severity: Secondary | ICD-10-CM | POA: Diagnosis present

## 2020-01-12 DIAGNOSIS — Z86711 Personal history of pulmonary embolism: Secondary | ICD-10-CM | POA: Diagnosis not present

## 2020-01-12 DIAGNOSIS — L03116 Cellulitis of left lower limb: Secondary | ICD-10-CM | POA: Diagnosis present

## 2020-01-12 LAB — CBC
HCT: 38.1 % — ABNORMAL LOW (ref 39.0–52.0)
Hemoglobin: 12.8 g/dL — ABNORMAL LOW (ref 13.0–17.0)
MCH: 27.4 pg (ref 26.0–34.0)
MCHC: 33.6 g/dL (ref 30.0–36.0)
MCV: 81.6 fL (ref 80.0–100.0)
Platelets: 185 10*3/uL (ref 150–400)
RBC: 4.67 MIL/uL (ref 4.22–5.81)
RDW: 16.1 % — ABNORMAL HIGH (ref 11.5–15.5)
WBC: 4.1 10*3/uL (ref 4.0–10.5)
nRBC: 1.2 % — ABNORMAL HIGH (ref 0.0–0.2)

## 2020-01-12 LAB — HEPARIN LEVEL (UNFRACTIONATED): Heparin Unfractionated: 0.64 IU/mL (ref 0.30–0.70)

## 2020-01-12 LAB — COMPREHENSIVE METABOLIC PANEL
ALT: 73 U/L — ABNORMAL HIGH (ref 0–44)
AST: 92 U/L — ABNORMAL HIGH (ref 15–41)
Albumin: 3.3 g/dL — ABNORMAL LOW (ref 3.5–5.0)
Alkaline Phosphatase: 55 U/L (ref 38–126)
Anion gap: 12 (ref 5–15)
BUN: 5 mg/dL — ABNORMAL LOW (ref 6–20)
CO2: 23 mmol/L (ref 22–32)
Calcium: 8.5 mg/dL — ABNORMAL LOW (ref 8.9–10.3)
Chloride: 101 mmol/L (ref 98–111)
Creatinine, Ser: 0.9 mg/dL (ref 0.61–1.24)
GFR calc Af Amer: >60 mL/min (ref >60)
GFR calc non Af Amer: >60 mL/min (ref >60)
Glucose, Bld: 102 mg/dL — ABNORMAL HIGH (ref 70–99)
Potassium: 4.6 mmol/L (ref 3.5–5.1)
Sodium: 136 mmol/L (ref 135–145)
Total Bilirubin: 1.3 mg/dL — ABNORMAL HIGH (ref 0.3–1.2)
Total Protein: 6.4 g/dL — ABNORMAL LOW (ref 6.5–8.1)

## 2020-01-12 LAB — LACTIC ACID, PLASMA
Lactic Acid, Venous: 2.1 mmol/L (ref 0.5–1.9)
Lactic Acid, Venous: 2 mmol/L (ref 0.5–1.9)

## 2020-01-12 LAB — PROTIME-INR
INR: 2.3 — ABNORMAL HIGH (ref 0.8–1.2)
Prothrombin Time: 25.3 seconds — ABNORMAL HIGH (ref 11.4–15.2)

## 2020-01-12 LAB — MAGNESIUM: Magnesium: 1.4 mg/dL — ABNORMAL LOW (ref 1.7–2.4)

## 2020-01-12 LAB — HIV ANTIBODY (ROUTINE TESTING W REFLEX): HIV Screen 4th Generation wRfx: NONREACTIVE

## 2020-01-12 LAB — C-REACTIVE PROTEIN: CRP: 0.8 mg/dL (ref <1.0)

## 2020-01-12 LAB — SARS CORONAVIRUS 2 (TAT 6-24 HRS): SARS Coronavirus 2: NEGATIVE

## 2020-01-12 LAB — SEDIMENTATION RATE: Sed Rate: 7 mm/hr (ref 0–16)

## 2020-01-12 LAB — PHOSPHORUS: Phosphorus: 3.8 mg/dL (ref 2.5–4.6)

## 2020-01-12 MED ORDER — MAGNESIUM SULFATE 2 GM/50ML IV SOLN
2.0000 g | Freq: Once | INTRAVENOUS | Status: AC
  Administered 2020-01-12: 15:00:00 2 g via INTRAVENOUS
  Filled 2020-01-12: qty 50

## 2020-01-12 MED ORDER — HEPARIN (PORCINE) 25000 UT/250ML-% IV SOLN
1150.0000 [IU]/h | INTRAVENOUS | Status: DC
  Administered 2020-01-12 – 2020-01-13 (×2): 1450 [IU]/h via INTRAVENOUS
  Administered 2020-01-14 – 2020-01-16 (×4): 1150 [IU]/h via INTRAVENOUS
  Filled 2020-01-12 (×5): qty 250

## 2020-01-12 MED ORDER — SODIUM CHLORIDE 0.9 % IV SOLN: INTRAVENOUS | Status: DC

## 2020-01-12 MED ORDER — IOHEXOL 300 MG/ML  SOLN
100.0000 mL | Freq: Once | INTRAMUSCULAR | Status: AC | PRN
  Administered 2020-01-12: 01:00:00 100 mL via INTRAVENOUS

## 2020-01-12 NOTE — Progress Notes (Addendum)
PROGRESS NOTE    Leonard Bentley Learn  JTT:[id number] DOB: [birth year] DOA: 01/11/2020 PCP: Leonard Pounds, NP   Brief Narrative:  HPI: Leonard Bentley is a 59 y.o. male with medical history significant for severe mitral regurgitation s/p mitral valve repair and recurrent PE on Coumadin, history of SVT/PAT/PACs, chronic combined systolic and diastolic CHF (EF improved to 55-60%), history of stage II non-Hodgkin's lymphoma s/p chemotherapy with CHOP/Rituxan on observation, left first toe osteomyelitis s/p left great toe amputation, depression/anxiety, alcohol use, and tobacco use who presents to the ED for evaluation of left foot infection.  Patient reports progressive pain on his left foot.  He has noticed change in appearance of his left second toe with clear drainage.  He has been having difficulty ambulating because of the pain.  He is feeling generally weak and tired.  He says he becomes fatigued easily after walking for short time.  He denies any fevers but reports chills.  He denies any chest pain, cough, nausea, vomiting, abdominal pain, dysuria, diarrhea.  He is on Coumadin for his history of mitral valve repair and prior PE.  He reports falling on his right elbow 3 months ago with continued pain whenever he puts pressure on his elbow.  He reports smoking 1 pack/[place] of cigarettes.  He reports drinking at least 1 bottle of wine daily with last drink 12 hours ago.  ED Course:  Initial vitals showed BP 130/85, pulse 84, RR 20, temp 98.5 Fahrenheit, SPO2 99% on room air.  Labs notable for WBC 4.3, hemoglobin 13.6, platelets 209,000, sodium 136, potassium 4.3, bicarb 22, BUN 6, creatinine 0.88, AST 105, ALT 79, alk phos 57, total bilirubin 0.8, serum glucose 114, lactic acid 3.3, INR 2.2.  Blood cultures were obtained and pending.  SARS-CoV-2 PCR was ordered and pending.  Left foot x-ray showed prior amputation of the great toe at the MTP joint with some rare fraction of the distal aspect of the  MTP without focal destruction.  2 view chest x-ray showed prior mitral valve replacement without active disease.  Right elbow x-ray was negative for evidence of fracture, dislocation, or joint effusion.  Patient was Bentley IV Zofran and Dilaudid.  Hospitalist service was consulted admit for further evaluation and management.  Assessment & Plan:   Principal Problem:   Left foot infection Active Problems:   History of pulmonary embolus (PE)   S/P minimally-invasive mitral valve repair   Chronic combined systolic and diastolic heart failure (HCC)   Left foot infection at second toe/s/p left great toe amputation/left toe osteomyelitis: CT foot confirms left toe osteomyelitis.  Consulted orthopedics and spoke to Dr. Louanne Bentley who in turn spoke to Dr. Sharol Bentley and informed me that patient will be scheduled for surgery next week so he can eat.  I told him that patient is not on any antibiotics.  He will see patient later and further decide if patient needs to be on antibiotics before surgery or not.  Elevated lactic acid.  Will start on IV fluids.  Follow lactic acid later today.  ESR normal.  Severe mitral regurgitation s/p mitral valve repair and history of PE: Patient takes Coumadin at home which is on hold due to anticipated surgery.  Now that he is going to have surgery sometime next week, I will start him on heparin with pharmacy to consult.  History of SVT/PAT/PACs: Controlled.  Continue Toprol-XL.  Hypomagnesemia: 1.4.  Replaced with magnesium sulfate.  Chronic combined systolic and diastolic CHF: EF improved to  55-60%, he is no longer requiring diuretic therapy.  He is overall volume depleted.  Continue IV fluids as above.  Alcohol use: Reports drinking at least 1 bottle of wine nightly with last drink 12 hours prior to admission.  No signs of withdrawal.  Continue CIWA protocol with as needed Ativan.    History of stage II non-Hodgkin's lymphoma: S/p chemotherapy with CHOP/Rituxan  currently in observation.  Follows with oncology, Dr. Lorna Bentley.  Tobacco use: Smokes 1 pack/[place].  Advised on cutting back and eventual cessation.  Nicotine patch   DVT prophylaxis: Heparin drip   Code Status: Full Code  Family Communication:  None present at bedside.  Plan of care discussed with patient in length and he verbalized understanding and agreed with it. Patient is from: Home Disposition Plan: To be determined based on clinical course Barriers to discharge: Surgical procedure pending   Estimated body mass index is 29.39 kg/m as calculated from the following:   Height as of this encounter: '5\' 9"'$  (1.753 m).   Weight as of this encounter: 90.3 kg.      Nutritional status:               Consultants:   Orthopedics  Procedures:   None  Antimicrobials:   None   Subjective: Seen and examined.  He tells me that his pain has in fact improved.  No new complaint.  When I told him about osteomyelitis and potential amputation, he was scared and wants to salvage his foot.  Objective: Vitals:   01/12/20 0123 01/12/20 0357 01/12/20 0748 01/12/20 1000  BP: (!) 121/92 109/78 105/79 113/81  Pulse: (!) 102 (!) 107 99 92  Resp:   18   Temp: 99.7 F (37.6 C) 99.2 F (37.3 C) 99.2 F (37.3 C)   TempSrc: Oral Oral Oral   SpO2: 96% 93% 94%   Weight:      Height:        Intake/Output Summary (Last 24 hours) at 01/12/2020 1132 Last data filed at 01/12/2020 0900 Gross per 24 hour  Intake 0 ml  Output --  Net 0 ml   Filed Weights   01/11/20 1931  Weight: 90.3 kg    Examination:  General exam: Appears calm and comfortable  Respiratory system: Clear to auscultation. Respiratory effort normal. Cardiovascular system: S1 & S2 heard, RRR. No JVD, murmurs, rubs, gallops or clicks. No pedal edema. Gastrointestinal system: Abdomen is nondistended, soft and nontender. No organomegaly or masses felt. Normal bowel sounds heard. Central nervous system: Alert  and oriented. No focal neurological deficits. Extremities: Symmetric 5 x 5 power.  Status post amputation of the left great toe.  Left second toe with open wound but no drainage.  Tender to palpation.  Edematous. Skin: No rashes, lesions or ulcers Psychiatry: Judgement and insight appear normal. Mood & affect appropriate.        Data Reviewed: I have personally reviewed following labs and imaging studies  CBC: Recent Labs  Lab 01/11/20 2230 01/12/20 0208  WBC 4.3 4.1  NEUTROABS 3.4  --   HGB 13.6 12.8*  HCT 41.0 38.1*  MCV 83.0 81.6  PLT 209 800   Basic Metabolic Panel: Recent Labs  Lab 01/11/20 2230 01/12/20 0208  NA 136 136  K 4.3 4.6  CL 100 101  CO2 22 23  GLUCOSE 114* 102*  BUN 6 5*  CREATININE 0.88 0.90  CALCIUM 8.7* 8.5*  MG  --  1.4*  PHOS  --  3.8  GFR: Estimated Creatinine Clearance: 96.9 mL/min (by C-G formula based on SCr of 0.9 mg/dL). Liver Function Tests: Recent Labs  Lab 01/11/20 2230 01/12/20 0208  AST 105* 92*  ALT 79* 73*  ALKPHOS 57 55  BILITOT 0.8 1.3*  PROT 6.6 6.4*  ALBUMIN 3.4* 3.3*   No results for input(s): LIPASE, AMYLASE in the last 168 hours. No results for input(s): AMMONIA in the last 168 hours. Coagulation Profile: Recent Labs  Lab 01/11/20 2230 01/12/20 0208  INR 2.2* 2.3*   Cardiac Enzymes: No results for input(s): CKTOTAL, CKMB, CKMBINDEX, TROPONINI in the last 168 hours. BNP (last 3 results) No results for input(s): PROBNP in the last 8760 hours. HbA1C: No results for input(s): HGBA1C in the last 72 hours. CBG: No results for input(s): GLUCAP in the last 168 hours. Lipid Profile: No results for input(s): CHOL, HDL, LDLCALC, TRIG, CHOLHDL, LDLDIRECT in the last 72 hours. Thyroid Function Tests: No results for input(s): TSH, T4TOTAL, FREET4, T3FREE, THYROIDAB in the last 72 hours. Anemia Panel: No results for input(s): VITAMINB12, FOLATE, FERRITIN, TIBC, IRON, RETICCTPCT in the last 72 hours. Sepsis  Labs: Recent Labs  Lab 01/11/20 2230 01/12/20 0208  LATICACIDVEN 3.3* 2.1*    Recent Results (from the past 240 hour(s))  Blood culture (routine x 2)     Status: None (Preliminary result)   Collection Time: 01/11/20  8:39 PM   Specimen: BLOOD  Result Value Ref Range Status   Specimen Description BLOOD SITE NOT SPECIFIED  Final   Special Requests   Final    BOTTLES DRAWN AEROBIC AND ANAEROBIC Blood Culture results may not be optimal due to an inadequate volume of blood received in culture bottles   Culture   Final    NO GROWTH < 24 HOURS Performed at [facility], [place] [address] [place], [place] [address]    Report Status PENDING  Incomplete  Blood culture (routine x 2)     Status: None (Preliminary result)   Collection Time: 01/11/20 10:30 PM   Specimen: BLOOD  Result Value Ref Range Status   Specimen Description BLOOD SITE NOT SPECIFIED  Final   Special Requests AEROBIC BOTTLE ONLY Blood Culture adequate volume  Final   Culture   Final    NO GROWTH < 12 HOURS Performed at [facility], [place] [address] [place], [place] [address]    Report Status PENDING  Incomplete  SARS CORONAVIRUS 2 (TAT 6-24 HRS) Nasopharyngeal Nasopharyngeal Swab     Status: None   Collection Time: 01/12/20 12:47 AM   Specimen: Nasopharyngeal Swab  Result Value Ref Range Status   SARS Coronavirus 2 NEGATIVE NEGATIVE Final    Comment: (NOTE) SARS-CoV-2 target nucleic acids are NOT DETECTED. The SARS-CoV-2 RNA is generally detectable in upper and lower respiratory specimens during the acute phase of infection. Negative results do not preclude SARS-CoV-2 infection, do not rule out co-infections with other pathogens, and should not be used as the sole basis for treatment or other patient management decisions. Negative results must be combined with clinical observations, patient history, and epidemiological information. The expected result is Negative. Fact Sheet for  Patients: SugarRoll.be Fact Sheet for Healthcare Providers: https://www.woods-mathews.com/ This test is not yet approved or cleared by the [place] FDA and  has been authorized for detection and/or diagnosis of SARS-CoV-2 by FDA under an Emergency Use Authorization (EUA). This EUA will remain  in effect (meaning this test can be used) for the duration of the COVID-19 declaration  under Section 56 4(b)(1) of the Act, 21 U.S.C. section 360bbb-3(b)(1), unless the authorization is terminated or revoked sooner. Performed at [facility], [place] [address], [place], [place] [address]       Radiology Studies: DG Chest 2 View  Result Date: 01/11/2020 CLINICAL DATA:  Shortness of breath.  Foot infection. EXAM: CHEST - 2 VIEW COMPARISON:  09/28/2019 FINDINGS: Previous mitral valve replacement. Heart size is normal. Aorta is tortuous. Chronic lung markings are again demonstrated, but without evidence of active infiltrate, effusion or collapse. No bone abnormality. IMPRESSION: No active disease.  Previous mitral valve replacement. Electronically Signed   By: Nelson Chimes M.D.   On: 01/11/2020 22:01   DG ELBOW COMPLETE RIGHT (3+VIEW)  Result Date: 01/11/2020 CLINICAL DATA:  Foot infection.  Elbow pain. EXAM: RIGHT ELBOW - COMPLETE 3+ VIEW COMPARISON:  None. FINDINGS: There is no evidence of fracture, dislocation, or joint effusion. There is no evidence of arthropathy or other focal bone abnormality. Soft tissues are unremarkable. IMPRESSION: Negative. Electronically Signed   By: Nelson Chimes M.D.   On: 01/11/2020 22:02   CT FOOT LEFT W CONTRAST  Result Date: 01/12/2020 CLINICAL DATA:  Assess for left foot osteomyelitis. Foot infection. Patient states he cannot tolerate MRI. EXAM: CT OF THE LOWER LEFT EXTREMITY WITH CONTRAST TECHNIQUE: Multidetector CT imaging of the lower left extremity was performed according to the standard protocol following intravenous  contrast administration. COMPARISON:  Radiograph yesterday. CONTRAST:  151m OMNIPAQUE IOHEXOL 300 MG/ML  SOLN FINDINGS: Bones/Joint/Cartilage Prior great toe amputation. There is cortical irregularity about the distal first metatarsal head possible small erosions. No other findings suspicious for osteomyelitis of the foot. Generalized bony demineralization. There are hammertoe deformity of the digits. No acute fracture. Well corticated density adjacent to the anterior talus may represent sequela of remote prior injury. Minor fragmentation adjacent to the anterior calcaneus is chronic. Ligaments Suboptimally assessed by CT. Muscles and Tendons No intramuscular fluid collection. Mild fatty atrophy of the intrinsic musculature of the foot. Soft tissues Soft tissue thickening adjacent to the first metatarsal head at the operative bed with associated skin thickening. No focal fluid collection or abscess. Mild generalized subcutaneous soft tissue edema. IMPRESSION: 1. Post great toe amputation. Cortical irregularity about the distal first metatarsal head with possible small erosions, suspicious for osteomyelitis. Adjacent soft tissue thickening with associated skin thickening. No focal fluid collection or abscess. 2. No other sites concerning for osteomyelitis. Electronically Signed   By: MKeith RakeM.D.   On: 01/12/2020 00:56   DG Foot Complete Left  Result Date: 01/11/2020 CLINICAL DATA:  Foot infection. Toe amputation 10 months ago. Oozing and bleeding from the amputation site. EXAM: LEFT FOOT - COMPLETE 3+ VIEW COMPARISON:  01/09/2019 FINDINGS: Amputation of the great toe at the metatarsal phalangeal joint. Some degree of rare fraction of the distal metatarsal, which could be due to disuse osteopenia. No clear destructive focus to allow diagnosis of osteomyelitis. The other toes appear normal. The more proximal foot appears normal. IMPRESSION: Amputation of the great toe at the MTP joint. Some rare fraction  of the distal aspect of the MTP, but without focal destruction. Cannot definitely establish osteomyelitis based on this appearance. Electronically Signed   By: MNelson ChimesM.D.   On: 01/11/2020 22:04    Scheduled Meds: . folic acid  1 mg Oral Daily  . metoprolol succinate  50 mg Oral Daily  . multivitamin with minerals  1 tablet Oral Daily  . nicotine  21 mg Transdermal Daily  . thiamine  100 mg Oral Daily   Or  . thiamine  100 mg Intravenous Daily   Continuous Infusions: . sodium chloride    . magnesium sulfate bolus IVPB       LOS: 0 days   Time spent: 35 minutes   Darliss Cheney, MD [facility]  01/12/2020, 11:32 AM   To contact the attending provider between 7A-7P or the covering provider during after hours 7P-7A, please log into the web site [email or web address].

## 2020-01-12 NOTE — ED Notes (Signed)
Phoned report to receiving RN, pt in stable condition. All belongings gathered in pt stretcher. Will arrange transport.

## 2020-01-12 NOTE — Progress Notes (Signed)
ANTICOAGULATION CONSULT NOTE - Initial Consult  Pharmacy Consult for heparin Indication: hx recurrent PE  Allergies  Allergen Reactions  . Other Other (See Comments)    "Cactus" caused blisters on tongue (if prepared as food)    Patient Measurements: Height: 5\' 9"  (175.3 cm) Weight: 199 lb (90.3 kg) IBW/kg (Calculated) : 70.7 Heparin Dosing Weight: 90 kg  Vital Signs: Temp: 99.2 F (37.3 C) (03/06 0748) Temp Source: Oral (03/06 0748) BP: 113/81 (03/06 1000) Pulse Rate: 92 (03/06 1000)  Labs: Recent Labs    01/11/20 2230 01/12/20 0208  HGB 13.6 12.8*  HCT 41.0 38.1*  PLT 209 185  LABPROT 24.5* 25.3*  INR 2.2* 2.3*  CREATININE 0.88 0.90    Estimated Creatinine Clearance: 96.9 mL/min (by C-G formula based on SCr of 0.9 mg/dL).   Medical History: Past Medical History:  Diagnosis Date  . Acute pulmonary embolism ([place]) 04/11/2016  . Anxiety   . Arthritis   . Atrial tachycardia ([place]) 01/06/2016  . Depression   . DTs (delirium tremens) ([place])   . Dyspnea   . ED (erectile dysfunction)   . ETOH abuse   . Hypertension   . Lymphoma, non Hodgkin's 09/25/2011   Stage 2  . Mitral regurgitation 01/20/2016  . Mobitz I 10/09/2015  . Murmur 01/06/2016  . Occasional tremors   . PAC (premature atrial contraction) 10/09/2015  . S/P minimally-invasive mitral valve repair 10/27/2016   Complex valvuloplasty including artificial Gore-tex neochord placement x6 and 34 mm Edwards Physio II ring annuloplasty via right mini thoracotomy approach    Medications:  Scheduled:  . folic acid  1 mg Oral Daily  . metoprolol succinate  50 mg Oral Daily  . multivitamin with minerals  1 tablet Oral Daily  . nicotine  21 mg Transdermal Daily  . thiamine  100 mg Oral Daily   Or  . thiamine  100 mg Intravenous Daily   Infusions:  . sodium chloride    . magnesium sulfate bolus IVPB      Assessment: 20 yoM admitted for left foot infection at second toe s/p left great toe  amputation/osteomyelitis. Pt was on warfarin PTA for hx of PE. Pt will be scheduled for surgery next week, warfarin will be held until then. Pharmacy consulted to dose heparin.  Last dose of warfarin taken on 3/4. INR is 2.3 today. CBC wnl. Cr stable at 0.9.   PTA dose: 2.5 mg PO daily  Goal of Therapy:  Heparin level 0.3-0.7 units/ml Monitor platelets by anticoagulation protocol: Yes   Plan:  No heparin bolus Heparin infusion at 1450 units/hr (16 units/kg/hr) Heparin level in 6 hours Daily heparin level, CBC, s/sx of bleeding F/u surgical plans  Berenice Bouton, PharmD PGY1 Pharmacy Resident  Please check AMION for all [place] phone numbers After 10:00 PM, call [place] [phone]  01/12/2020,11:53 AM

## 2020-01-12 NOTE — Progress Notes (Signed)
[place] for heparin Indication: hx recurrent PE  Allergies  Allergen Reactions  . Other Other (See Comments)    "Cactus" caused blisters on tongue (if prepared as food)    Patient Measurements: Height: 5\' 9"  (175.3 cm) Weight: 199 lb (90.3 kg) IBW/kg (Calculated) : 70.7 Heparin Dosing Weight: 90 kg  Vital Signs: Temp: 98.5 F (36.9 C) (03/06 1941) Temp Source: Oral (03/06 1941) BP: 106/84 (03/06 1941) Pulse Rate: 102 (03/06 1941)  Labs: Recent Labs    01/11/20 2230 01/12/20 0208 01/12/20 1942  HGB 13.6 12.8*  --   HCT 41.0 38.1*  --   PLT 209 185  --   LABPROT 24.5* 25.3*  --   INR 2.2* 2.3*  --   HEPARINUNFRC  --   --  0.64  CREATININE 0.88 0.90  --     Estimated Creatinine Clearance: 96.9 mL/min (by C-G formula based on SCr of 0.9 mg/dL).   Medical History: Past Medical History:  Diagnosis Date  . Acute pulmonary embolism ([place]) 04/11/2016  . Anxiety   . Arthritis   . Atrial tachycardia ([place]) 01/06/2016  . Depression   . DTs (delirium tremens) ([place])   . Dyspnea   . ED (erectile dysfunction)   . ETOH abuse   . Hypertension   . Lymphoma, non Hodgkin's 09/25/2011   Stage 2  . Mitral regurgitation 01/20/2016  . Mobitz I 10/09/2015  . Murmur 01/06/2016  . Occasional tremors   . PAC (premature atrial contraction) 10/09/2015  . S/P minimally-invasive mitral valve repair 10/27/2016   Complex valvuloplasty including artificial Gore-tex neochord placement x6 and 34 mm Edwards Physio II ring annuloplasty via right mini thoracotomy approach    Medications:  Scheduled:  . folic acid  1 mg Oral Daily  . metoprolol succinate  50 mg Oral Daily  . multivitamin with minerals  1 tablet Oral Daily  . nicotine  21 mg Transdermal Daily  . thiamine  100 mg Oral Daily   Or  . thiamine  100 mg Intravenous Daily   Infusions:  . sodium chloride 100 mL/hr at 01/12/20 1400  . heparin 1,450 Units/hr (01/12/20 1342)     Assessment: 64 yoM admitted for left foot infection at second toe s/p left great toe amputation/osteomyelitis. Pt was on warfarin PTA for hx of PE. Pt will be scheduled for surgery next week, warfarin will be held until then. Pharmacy consulted to dose heparin.  Last dose of warfarin taken on 3/4. INR is 2.3 today. CBC wnl. Cr stable at 0.9.   PTA dose: 2.5 mg PO daily  Initial heparin level at goal, no bleeding issues noted.   Goal of Therapy:  Heparin level 0.3-0.7 units/ml Monitor platelets by anticoagulation protocol: Yes   Plan:  Heparin infusion at 1450 units/hr Daily heparin level, CBC, s/sx of bleeding F/u surgical plans  Erin Hearing PharmD., BCPS Clinical Pharmacist 01/12/2020 9:00 PM

## 2020-01-12 NOTE — Progress Notes (Signed)
     Subjective:   59 year old male admitted yesterday with left foot infection. Previous left great toe amputation by Dr. [name]. History of MVR and prior PE on coumadin chronicly. He has radiographic signes of osteolysis of the distal left great toe metatarsal head. Dr. [name] is aware of this patient. He does not recommend use of antibiotics so that intraoperative cultures will be diagnostic and allow for antibiotic selection post op.  Patient reports pain as mild.    Objective:   VITALS:  Temp:  [97.4 F (36.3 C)-99.7 F (37.6 C)] 97.4 F (36.3 C) (03/06 1410) Pulse Rate:  [83-107] 99 (03/06 1410) Resp:  [18] 18 (03/06 1410) BP: (105-138)/(76-96) 138/83 (03/06 1410) SpO2:  [93 %-99 %] 97 % (03/06 1410) Weight:  [90.3 kg] 90.3 kg (03/05 1931)  Neurologically intact ABD soft   LABS Recent Labs    01/11/20 2230 01/12/20 0208  HGB 13.6 12.8*  WBC 4.3 4.1  PLT 209 185   Recent Labs    01/11/20 2230 01/12/20 0208  NA 136 136  K 4.3 4.6  CL 100 101  CO2 22 23  BUN 6 5*  CREATININE 0.88 0.90  GLUCOSE 114* 102*   Recent Labs    01/11/20 2230 01/12/20 0208  INR 2.2* 2.3*     Assessment/Plan:   Left great toe metatarsal head osteomyelitis.   Continue with diet as surgery will be on Monday. Hold antibiotics for intraop cultures. Pain control.   Basil Dess 01/12/2020, 4:36 PMPatient ID: Leonard Bentley, male   DOB: [birth year], 60 y.o.   MRN: [id number]

## 2020-01-13 DIAGNOSIS — L03116 Cellulitis of left lower limb: Secondary | ICD-10-CM

## 2020-01-13 DIAGNOSIS — L089 Local infection of the skin and subcutaneous tissue, unspecified: Secondary | ICD-10-CM

## 2020-01-13 LAB — CBC
HCT: 34.3 % — ABNORMAL LOW (ref 39.0–52.0)
Hemoglobin: 11.1 g/dL — ABNORMAL LOW (ref 13.0–17.0)
MCH: 27.5 pg (ref 26.0–34.0)
MCHC: 32.4 g/dL (ref 30.0–36.0)
MCV: 85.1 fL (ref 80.0–100.0)
Platelets: 178 10*3/uL (ref 150–400)
RBC: 4.03 MIL/uL — ABNORMAL LOW (ref 4.22–5.81)
RDW: 16.6 % — ABNORMAL HIGH (ref 11.5–15.5)
WBC: 3.5 10*3/uL — ABNORMAL LOW (ref 4.0–10.5)
nRBC: 0.9 % — ABNORMAL HIGH (ref 0.0–0.2)

## 2020-01-13 LAB — HEPARIN LEVEL (UNFRACTIONATED)
Heparin Unfractionated: 0.84 IU/mL — ABNORMAL HIGH (ref 0.30–0.70)
Heparin Unfractionated: 0.55 IU/mL (ref 0.30–0.70)
Heparin Unfractionated: 0.76 IU/mL — ABNORMAL HIGH (ref 0.30–0.70)

## 2020-01-13 LAB — BASIC METABOLIC PANEL
Anion gap: 11 (ref 5–15)
BUN: 12 mg/dL (ref 6–20)
CO2: 24 mmol/L (ref 22–32)
Calcium: 8.4 mg/dL — ABNORMAL LOW (ref 8.9–10.3)
Chloride: 103 mmol/L (ref 98–111)
Creatinine, Ser: 0.94 mg/dL (ref 0.61–1.24)
GFR calc Af Amer: >60 mL/min (ref >60)
GFR calc non Af Amer: >60 mL/min (ref >60)
Glucose, Bld: 99 mg/dL (ref 70–99)
Potassium: 4.3 mmol/L (ref 3.5–5.1)
Sodium: 138 mmol/L (ref 135–145)

## 2020-01-13 LAB — PROTIME-INR
INR: 1.7 — ABNORMAL HIGH (ref 0.8–1.2)
Prothrombin Time: 20 seconds — ABNORMAL HIGH (ref 11.4–15.2)

## 2020-01-13 NOTE — Progress Notes (Signed)
[place] for heparin Indication: hx recurrent PE  Allergies  Allergen Reactions  . Other Other (See Comments)    "Cactus" caused blisters on tongue (if prepared as food)    Patient Measurements: Height: 5\' 9"  (175.3 cm) Weight: 199 lb (90.3 kg) IBW/kg (Calculated) : 70.7 Heparin Dosing Weight: 90 kg  Vital Signs: Temp: 97.7 F (36.5 C) (03/07 0738) Temp Source: Oral (03/07 0738) BP: 109/88 (03/07 0738) Pulse Rate: 91 (03/07 0738)  Labs: Recent Labs    01/11/20 2230 01/11/20 2230 01/12/20 0208 01/12/20 1942 01/13/20 0436 01/13/20 1205  HGB 13.6   < > 12.8*  --  11.1*  --   HCT 41.0  --  38.1*  --  34.3*  --   PLT 209  --  185  --  178  --   LABPROT 24.5*  --  25.3*  --  20.0*  --   INR 2.2*  --  2.3*  --  1.7*  --   HEPARINUNFRC  --   --   --  0.64 0.84* 0.55  CREATININE 0.88  --  0.90  --  0.94  --    < > = values in this interval not displayed.    Estimated Creatinine Clearance: 92.8 mL/min (by C-G formula based on SCr of 0.94 mg/dL).   Assessment: 30 yoM admitted for left foot infection at second toe s/p left great toe amputation/osteomyelitis. Pt was on warfarin PTA for hx of PE. Pt will be scheduled for surgery next week, warfarin will be held until then. Pharmacy consulted to dose heparin.  Last dose of warfarin taken on 3/4. INR down to 1.7.  PTA dose: 2.5 mg PO daily  Heparin level therapeutic at 0.55 after rate reduction to 1300 units/hr. No bleeding noted per RN.   Goal of Therapy:  Heparin level 0.3-0.7 units/ml Monitor platelets by anticoagulation protocol: Yes   Plan:  Continue heparin infusion at 1300 units/hr  6 hr confirmatory heparin level Daily heparin level, CBC, s/sx of bleeding Daily INR F/u surgical plans  Berenice Bouton, PharmD PGY1 Pharmacy Resident  Please check AMION for all [place] phone numbers After 10:00 PM, call [place] [phone]  01/13/2020 1:04 PM

## 2020-01-13 NOTE — H&P (View-Only) (Signed)
ORTHOPAEDIC CONSULTATION  REQUESTING PHYSICIAN: Darliss Cheney, MD  Chief Complaint: Abscess ulceration left second toe with pain beneath the first metatarsal head.  HPI: Leonard Bentley is a 59 y.o. male who presents with previous amputation left great toe.  Patient presents complaining of pain beneath the first metatarsal head as well as ulceration swelling and cellulitis of the second toe.  Past Medical History:  Diagnosis Date   Acute pulmonary embolism ([place]) 04/11/2016   Anxiety    Arthritis    Atrial tachycardia ([place]) 01/06/2016   Depression    DTs (delirium tremens) ([place])    Dyspnea    ED (erectile dysfunction)    ETOH abuse    Hypertension    Lymphoma, non Hodgkin's 09/25/2011   Stage 2   Mitral regurgitation 01/20/2016   Mobitz I 10/09/2015   Murmur 01/06/2016   Occasional tremors    PAC (premature atrial contraction) 10/09/2015   S/P minimally-invasive mitral valve repair 10/27/2016   Complex valvuloplasty including artificial Gore-tex neochord placement x6 and 34 mm Edwards Physio II ring annuloplasty via right mini thoracotomy approach   Past Surgical History:  Procedure Laterality Date   AMPUTATION Left 01/12/2019   Procedure: LEFT GREAT TOE AMPUTATION;  Surgeon: [name], MD;  Location: [facility];  Service: Orthopedics;  Laterality: Left;   CARDIAC CATHETERIZATION N/A 08/13/2016   Procedure: Right/Left Heart Cath and Coronary Angiography;  Surgeon: [name], MD;  Location: [facility];  Service: Cardiovascular;  Laterality: N/A;   MITRAL VALVE REPAIR Right 10/27/2016   Procedure: MINIMALLY INVASIVE MITRAL VALVE REPAIR (MVR) USING 34 PHYSIO II ANNULOPLASTY RING;  Surgeon: [name], MD;  Location: [facility];  Service: Open Heart Surgery;  Laterality: Right;   SKIN BIOPSY Right 04/04/2018   right mid medial anterior tibial shave  see report in chart   TEE WITHOUT CARDIOVERSION N/A 02/11/2016   Procedure: TRANSESOPHAGEAL  ECHOCARDIOGRAM (TEE);  Surgeon: [name], MD;  Location: [facility];  Service: Cardiovascular;  Laterality: N/A;   TEE WITHOUT CARDIOVERSION N/A 10/27/2016   Procedure: TRANSESOPHAGEAL ECHOCARDIOGRAM (TEE);  Surgeon: [name], MD;  Location: [facility];  Service: Open Heart Surgery;  Laterality: N/A;   TRIGGER FINGER RELEASE Bilateral    Social History   Socioeconomic History   Marital status: Single    Spouse name: Not on file   Number of children: Not on file   Years of education: Not on file   Highest education level: Not on file  Occupational History   Occupation: unemployed  Tobacco Use   Smoking status: Current Every [place] Smoker    Packs/[place]: 1.00    Years: 40.00    Pack years: 40.00    Types: Cigarettes, E-cigarettes   Smokeless tobacco: Former Systems developer    Quit date: 1985   Tobacco comment: vapes all [place]  Substance and Sexual Activity   Alcohol use: Yes    Alcohol/week: 0.0 standard drinks    Comment: 1-2 bottles of red wine a [place]   Drug use: No   Sexual activity: Not Currently  Other Topics Concern   Not on file  Social History Narrative   Epworth sleepiness scale as of 10/09/15 a 1   Social Determinants of Health   Financial Resource Strain:    Difficulty of Paying Living Expenses: Not on file  Food Insecurity:    Worried About [place] in the Last Year: Not on file   [employer] of Food in the  Last Year: Not on file  Transportation Needs:    Lack of Transportation (Medical): Not on file   Lack of Transportation (Non-Medical): Not on file  Physical Activity:    Days of Exercise per Week: Not on file   Minutes of Exercise per Session: Not on file  Stress:    Feeling of Stress : Not on file  Social Connections:    Frequency of Communication with Friends and Family: Not on file   Frequency of Social Gatherings with Friends and Family: Not on file   Attends Religious Services: Not on file   Active Member of Clubs or  Organizations: Not on file   Attends Archivist Meetings: Not on file   Marital Status: Not on file   Family History  Problem Relation Age of Onset   Cancer Mother        BREAST(BONE)   Cancer Father        PANCREATIC   Hypertension Maternal Grandmother    Stroke Maternal Aunt    Heart attack Neg Hx    - negative except otherwise stated in the family history section Allergies  Allergen Reactions   Other Other (See Comments)    "Cactus" caused blisters on tongue (if prepared as food)   Prior to Admission medications   Medication Sig Start Date End Date Taking? Authorizing Provider  metoprolol succinate (TOPROL-XL) 50 MG 24 hr tablet Take 1 tablet (50 mg total) by mouth daily. Take with or immediately following a meal. 03/06/19  Yes [name], NP  warfarin (COUMADIN) 2.5 MG tablet TAKE ONE TABLET BY MOUTH DAILY AS DIRECTED BY THE COUMADIN CLINIC. Patient taking differently: Take 2.5 mg by mouth every evening.  11/15/19  Yes Charlott Rakes, MD  diclofenac sodium (VOLTAREN) 1 % GEL Apply 2 g topically 4 (four) times daily as needed. Patient not taking: Reported on 01/11/2020 08/03/19   [name], NP   DG Chest 2 View  Result Date: 01/11/2020 CLINICAL DATA:  Shortness of breath.  Foot infection. EXAM: CHEST - 2 VIEW COMPARISON:  09/28/2019 FINDINGS: Previous mitral valve replacement. Heart size is normal. Aorta is tortuous. Chronic lung markings are again demonstrated, but without evidence of active infiltrate, effusion or collapse. No bone abnormality. IMPRESSION: No active disease.  Previous mitral valve replacement. Electronically Signed   By: Nelson Chimes M.D.   On: 01/11/2020 22:01   DG ELBOW COMPLETE RIGHT (3+VIEW)  Result Date: 01/11/2020 CLINICAL DATA:  Foot infection.  Elbow pain. EXAM: RIGHT ELBOW - COMPLETE 3+ VIEW COMPARISON:  None. FINDINGS: There is no evidence of fracture, dislocation, or joint effusion. There is no evidence of arthropathy or other  focal bone abnormality. Soft tissues are unremarkable. IMPRESSION: Negative. Electronically Signed   By: Nelson Chimes M.D.   On: 01/11/2020 22:02   CT FOOT LEFT W CONTRAST  Result Date: 01/12/2020 CLINICAL DATA:  Assess for left foot osteomyelitis. Foot infection. Patient states he cannot tolerate MRI. EXAM: CT OF THE LOWER LEFT EXTREMITY WITH CONTRAST TECHNIQUE: Multidetector CT imaging of the lower left extremity was performed according to the standard protocol following intravenous contrast administration. COMPARISON:  Radiograph yesterday. CONTRAST:  131mL OMNIPAQUE IOHEXOL 300 MG/ML  SOLN FINDINGS: Bones/Joint/Cartilage Prior great toe amputation. There is cortical irregularity about the distal first metatarsal head possible small erosions. No other findings suspicious for osteomyelitis of the foot. Generalized bony demineralization. There are hammertoe deformity of the digits. No acute fracture. Well corticated density adjacent to the anterior talus  may represent sequela of remote prior injury. Minor fragmentation adjacent to the anterior calcaneus is chronic. Ligaments Suboptimally assessed by CT. Muscles and Tendons No intramuscular fluid collection. Mild fatty atrophy of the intrinsic musculature of the foot. Soft tissues Soft tissue thickening adjacent to the first metatarsal head at the operative bed with associated skin thickening. No focal fluid collection or abscess. Mild generalized subcutaneous soft tissue edema. IMPRESSION: 1. Post great toe amputation. Cortical irregularity about the distal first metatarsal head with possible small erosions, suspicious for osteomyelitis. Adjacent soft tissue thickening with associated skin thickening. No focal fluid collection or abscess. 2. No other sites concerning for osteomyelitis. Electronically Signed   By: Keith Rake M.D.   On: 01/12/2020 00:56   DG Foot Complete Left  Result Date: 01/11/2020 CLINICAL DATA:  Foot infection. Toe amputation 10  months ago. Oozing and bleeding from the amputation site. EXAM: LEFT FOOT - COMPLETE 3+ VIEW COMPARISON:  01/09/2019 FINDINGS: Amputation of the great toe at the metatarsal phalangeal joint. Some degree of rare fraction of the distal metatarsal, which could be due to disuse osteopenia. No clear destructive focus to allow diagnosis of osteomyelitis. The other toes appear normal. The more proximal foot appears normal. IMPRESSION: Amputation of the great toe at the MTP joint. Some rare fraction of the distal aspect of the MTP, but without focal destruction. Cannot definitely establish osteomyelitis based on this appearance. Electronically Signed   By: Nelson Chimes M.D.   On: 01/11/2020 22:04   - pertinent xrays, CT, MRI studies were reviewed and independently interpreted  Positive ROS: All other systems have been reviewed and were otherwise negative with the exception of those mentioned in the HPI and as above.  Physical Exam: General: Alert, no acute distress Psychiatric: Patient is competent for consent with normal mood and affect Lymphatic: No axillary or cervical lymphadenopathy Cardiovascular: No pedal edema Respiratory: No cyanosis, no use of accessory musculature GI: No organomegaly, abdomen is soft and non-tender    Images:  @ENCIMAGES @  Labs:  Lab Results  Component Value Date   HGBA1C 5.2 10/25/2016   HGBA1C 6.1 (H) 04/03/2016   HGBA1C 5.9 (H) 08/18/2015   ESRSEDRATE 7 01/12/2020   CRP 0.8 01/12/2020   LABURIC 5.2 12/09/2018   LABURIC 7.4 08/18/2015   LABURIC 4.1 12/01/2011   REPTSTATUS PENDING 01/11/2020   CULT  01/11/2020    NO GROWTH < 12 HOURS Performed at [facility] [address], [place], [place] [address]     Lab Results  Component Value Date   ALBUMIN 3.3 (L) 01/12/2020   ALBUMIN 3.4 (L) 01/11/2020   ALBUMIN 4.1 09/19/2019   LABURIC 5.2 12/09/2018   LABURIC 7.4 08/18/2015   LABURIC 4.1 12/01/2011    Neurologic: Patient does not have protective  sensation bilateral lower extremities.   MUSCULOSKELETAL:   Skin: Examination patient has necrotic skin ulceration sausage digit swelling of the left second toe.  Patient has swelling and tenderness to palpation beneath the first metatarsal head.  Patient has a strong dorsalis pedis pulse.  Review of the CT scan shows bony changes of the first metatarsal head consistent with osteomyelitis.  Patient has sausage digit swelling and ulceration of the second toe also consistent with osteomyelitis.  Assessment: Assessment: Osteomyelitis left foot second toe and first metatarsal head.  Plan: Plan: Discussed with the patient would plan for surgery for amputation of the second toe as well as partial ray amputation of the first metatarsal head.  Risk and  benefits were discussed including risk of the wound not healing with weightbearing.  Discussed that he would need crutches or walker to unload pressure from his foot postoperatively for 2 to 3 weeks.  Patient states he understands wished to proceed at this time.  Thank you for the consult and the opportunity to see Mr. [place], MD [place] [phone] 8:45 AM

## 2020-01-13 NOTE — Consult Note (Signed)
ORTHOPAEDIC CONSULTATION  REQUESTING PHYSICIAN: Darliss Cheney, MD  Chief Complaint: Abscess ulceration left second toe with pain beneath the first metatarsal head.  HPI: Leonard Bentley is a 59 y.o. male who presents with previous amputation left great toe.  Patient presents complaining of pain beneath the first metatarsal head as well as ulceration swelling and cellulitis of the second toe.  Past Medical History:  Diagnosis Date  . Acute pulmonary embolism ([place]) 04/11/2016  . Anxiety   . Arthritis   . Atrial tachycardia ([place]) 01/06/2016  . Depression   . DTs (delirium tremens) ([place])   . Dyspnea   . ED (erectile dysfunction)   . ETOH abuse   . Hypertension   . Lymphoma, non Hodgkin's 09/25/2011   Stage 2  . Mitral regurgitation 01/20/2016  . Mobitz I 10/09/2015  . Murmur 01/06/2016  . Occasional tremors   . PAC (premature atrial contraction) 10/09/2015  . S/P minimally-invasive mitral valve repair 10/27/2016   Complex valvuloplasty including artificial Gore-tex neochord placement x6 and 34 mm Edwards Physio II ring annuloplasty via right mini thoracotomy approach   Past Surgical History:  Procedure Laterality Date  . AMPUTATION Left 01/12/2019   Procedure: LEFT GREAT TOE AMPUTATION;  Surgeon: [name], MD;  Location: [facility];  Service: Orthopedics;  Laterality: Left;  . CARDIAC CATHETERIZATION N/A 08/13/2016   Procedure: Right/Left Heart Cath and Coronary Angiography;  Surgeon: [name], MD;  Location: [facility];  Service: Cardiovascular;  Laterality: N/A;  . MITRAL VALVE REPAIR Right 10/27/2016   Procedure: MINIMALLY INVASIVE MITRAL VALVE REPAIR (MVR) USING 34 PHYSIO II ANNULOPLASTY RING;  Surgeon: [name], MD;  Location: [facility];  Service: Open Heart Surgery;  Laterality: Right;  . SKIN BIOPSY Right 04/04/2018   right mid medial anterior tibial shave  see report in chart  . TEE WITHOUT CARDIOVERSION N/A 02/11/2016   Procedure: TRANSESOPHAGEAL  ECHOCARDIOGRAM (TEE);  Surgeon: [name], MD;  Location: [facility];  Service: Cardiovascular;  Laterality: N/A;  . TEE WITHOUT CARDIOVERSION N/A 10/27/2016   Procedure: TRANSESOPHAGEAL ECHOCARDIOGRAM (TEE);  Surgeon: [name], MD;  Location: [facility];  Service: Open Heart Surgery;  Laterality: N/A;  . TRIGGER FINGER RELEASE Bilateral    Social History   Socioeconomic History  . Marital status: Single    Spouse name: Not on file  . Number of children: Not on file  . Years of education: Not on file  . Highest education level: Not on file  Occupational History  . Occupation: unemployed  Tobacco Use  . Smoking status: Current Every [place] Smoker    Packs/[place]: 1.00    Years: 40.00    Pack years: 40.00    Types: Cigarettes, E-cigarettes  . Smokeless tobacco: Former Systems developer    Quit date: 1985  . Tobacco comment: vapes all [place]  Substance and Sexual Activity  . Alcohol use: Yes    Alcohol/week: 0.0 standard drinks    Comment: 1-2 bottles of red wine a [place]  . Drug use: No  . Sexual activity: Not Currently  Other Topics Concern  . Not on file  Social History Narrative   Epworth sleepiness scale as of 10/09/15 a 1   Social Determinants of Health   Financial Resource Strain:   . Difficulty of Paying Living Expenses: Not on file  Food Insecurity:   . Worried About [employer] in the Last Year: Not on file  . Ran Out of Food in the  Last Year: Not on file  Transportation Needs:   . Lack of Transportation (Medical): Not on file  . Lack of Transportation (Non-Medical): Not on file  Physical Activity:   . Days of Exercise per Week: Not on file  . Minutes of Exercise per Session: Not on file  Stress:   . Feeling of Stress : Not on file  Social Connections:   . Frequency of Communication with Friends and Family: Not on file  . Frequency of Social Gatherings with Friends and Family: Not on file  . Attends Religious Services: Not on file  . Active Member of Clubs or  Organizations: Not on file  . Attends Archivist Meetings: Not on file  . Marital Status: Not on file   Family History  Problem Relation Age of Onset  . Cancer Mother        BREAST(BONE)  . Cancer Father        PANCREATIC  . Hypertension Maternal Grandmother   . Stroke Maternal Aunt   . Heart attack Neg Hx    - negative except otherwise stated in the family history section Allergies  Allergen Reactions  . Other Other (See Comments)    "Cactus" caused blisters on tongue (if prepared as food)   Prior to Admission medications   Medication Sig Start Date End Date Taking? Authorizing Provider  metoprolol succinate (TOPROL-XL) 50 MG 24 hr tablet Take 1 tablet (50 mg total) by mouth daily. Take with or immediately following a meal. 03/06/19  Yes [name], NP  warfarin (COUMADIN) 2.5 MG tablet TAKE ONE TABLET BY MOUTH DAILY AS DIRECTED BY THE COUMADIN CLINIC. Patient taking differently: Take 2.5 mg by mouth every evening.  11/15/19  Yes Charlott Rakes, MD  diclofenac sodium (VOLTAREN) 1 % GEL Apply 2 g topically 4 (four) times daily as needed. Patient not taking: Reported on 01/11/2020 08/03/19   [name], NP   DG Chest 2 View  Result Date: 01/11/2020 CLINICAL DATA:  Shortness of breath.  Foot infection. EXAM: CHEST - 2 VIEW COMPARISON:  09/28/2019 FINDINGS: Previous mitral valve replacement. Heart size is normal. Aorta is tortuous. Chronic lung markings are again demonstrated, but without evidence of active infiltrate, effusion or collapse. No bone abnormality. IMPRESSION: No active disease.  Previous mitral valve replacement. Electronically Signed   By: Nelson Chimes M.D.   On: 01/11/2020 22:01   DG ELBOW COMPLETE RIGHT (3+VIEW)  Result Date: 01/11/2020 CLINICAL DATA:  Foot infection.  Elbow pain. EXAM: RIGHT ELBOW - COMPLETE 3+ VIEW COMPARISON:  None. FINDINGS: There is no evidence of fracture, dislocation, or joint effusion. There is no evidence of arthropathy or other  focal bone abnormality. Soft tissues are unremarkable. IMPRESSION: Negative. Electronically Signed   By: Nelson Chimes M.D.   On: 01/11/2020 22:02   CT FOOT LEFT W CONTRAST  Result Date: 01/12/2020 CLINICAL DATA:  Assess for left foot osteomyelitis. Foot infection. Patient states he cannot tolerate MRI. EXAM: CT OF THE LOWER LEFT EXTREMITY WITH CONTRAST TECHNIQUE: Multidetector CT imaging of the lower left extremity was performed according to the standard protocol following intravenous contrast administration. COMPARISON:  Radiograph yesterday. CONTRAST:  150mL OMNIPAQUE IOHEXOL 300 MG/ML  SOLN FINDINGS: Bones/Joint/Cartilage Prior great toe amputation. There is cortical irregularity about the distal first metatarsal head possible small erosions. No other findings suspicious for osteomyelitis of the foot. Generalized bony demineralization. There are hammertoe deformity of the digits. No acute fracture. Well corticated density adjacent to the anterior talus  may represent sequela of remote prior injury. Minor fragmentation adjacent to the anterior calcaneus is chronic. Ligaments Suboptimally assessed by CT. Muscles and Tendons No intramuscular fluid collection. Mild fatty atrophy of the intrinsic musculature of the foot. Soft tissues Soft tissue thickening adjacent to the first metatarsal head at the operative bed with associated skin thickening. No focal fluid collection or abscess. Mild generalized subcutaneous soft tissue edema. IMPRESSION: 1. Post great toe amputation. Cortical irregularity about the distal first metatarsal head with possible small erosions, suspicious for osteomyelitis. Adjacent soft tissue thickening with associated skin thickening. No focal fluid collection or abscess. 2. No other sites concerning for osteomyelitis. Electronically Signed   By: Keith Rake M.D.   On: 01/12/2020 00:56   DG Foot Complete Left  Result Date: 01/11/2020 CLINICAL DATA:  Foot infection. Toe amputation 10  months ago. Oozing and bleeding from the amputation site. EXAM: LEFT FOOT - COMPLETE 3+ VIEW COMPARISON:  01/09/2019 FINDINGS: Amputation of the great toe at the metatarsal phalangeal joint. Some degree of rare fraction of the distal metatarsal, which could be due to disuse osteopenia. No clear destructive focus to allow diagnosis of osteomyelitis. The other toes appear normal. The more proximal foot appears normal. IMPRESSION: Amputation of the great toe at the MTP joint. Some rare fraction of the distal aspect of the MTP, but without focal destruction. Cannot definitely establish osteomyelitis based on this appearance. Electronically Signed   By: Nelson Chimes M.D.   On: 01/11/2020 22:04   - pertinent xrays, CT, MRI studies were reviewed and independently interpreted  Positive ROS: All other systems have been reviewed and were otherwise negative with the exception of those mentioned in the HPI and as above.  Physical Exam: General: Alert, no acute distress Psychiatric: Patient is competent for consent with normal mood and affect Lymphatic: No axillary or cervical lymphadenopathy Cardiovascular: No pedal edema Respiratory: No cyanosis, no use of accessory musculature GI: No organomegaly, abdomen is soft and non-tender    Images:  @ENCIMAGES @  Labs:  Lab Results  Component Value Date   HGBA1C 5.2 10/25/2016   HGBA1C 6.1 (H) 04/03/2016   HGBA1C 5.9 (H) 08/18/2015   ESRSEDRATE 7 01/12/2020   CRP 0.8 01/12/2020   LABURIC 5.2 12/09/2018   LABURIC 7.4 08/18/2015   LABURIC 4.1 12/01/2011   REPTSTATUS PENDING 01/11/2020   CULT  01/11/2020    NO GROWTH < 12 HOURS Performed at [facility] [address] [place], [place] [address]     Lab Results  Component Value Date   ALBUMIN 3.3 (L) 01/12/2020   ALBUMIN 3.4 (L) 01/11/2020   ALBUMIN 4.1 09/19/2019   LABURIC 5.2 12/09/2018   LABURIC 7.4 08/18/2015   LABURIC 4.1 12/01/2011    Neurologic: Patient does not have protective  sensation bilateral lower extremities.   MUSCULOSKELETAL:   Skin: Examination patient has necrotic skin ulceration sausage digit swelling of the left second toe.  Patient has swelling and tenderness to palpation beneath the first metatarsal head.  Patient has a strong dorsalis pedis pulse.  Review of the CT scan shows bony changes of the first metatarsal head consistent with osteomyelitis.  Patient has sausage digit swelling and ulceration of the second toe also consistent with osteomyelitis.  Assessment: Assessment: Osteomyelitis left foot second toe and first metatarsal head.  Plan: Plan: Discussed with the patient would plan for surgery for amputation of the second toe as well as partial ray amputation of the first metatarsal head.  Risk and  benefits were discussed including risk of the wound not healing with weightbearing.  Discussed that he would need crutches or walker to unload pressure from his foot postoperatively for 2 to 3 weeks.  Patient states he understands wished to proceed at this time.  Thank you for the consult and the opportunity to see Mr. [place], MD [place] [phone] 8:45 AM

## 2020-01-13 NOTE — Progress Notes (Signed)
[facility] for heparin Indication: hx recurrent PE  Allergies  Allergen Reactions  . Other Other (See Comments)    "Cactus" caused blisters on tongue (if prepared as food)    Patient Measurements: Height: 5\' 9"  (175.3 cm) Weight: 199 lb (90.3 kg) IBW/kg (Calculated) : 70.7 Heparin Dosing Weight: 90 kg  Vital Signs: Temp: 98.6 F (37 C) (03/07 0508) Temp Source: Oral (03/07 0508) BP: 119/98 (03/07 0508) Pulse Rate: 101 (03/07 0508)  Labs: Recent Labs    01/11/20 2230 01/11/20 2230 01/12/20 0208 01/12/20 1942 01/13/20 0436  HGB 13.6   < > 12.8*  --  11.1*  HCT 41.0  --  38.1*  --  34.3*  PLT 209  --  185  --  178  LABPROT 24.5*  --  25.3*  --  20.0*  INR 2.2*  --  2.3*  --  1.7*  HEPARINUNFRC  --   --   --  0.64 0.84*  CREATININE 0.88  --  0.90  --  0.94   < > = values in this interval not displayed.    Estimated Creatinine Clearance: 92.8 mL/min (by C-G formula based on SCr of 0.94 mg/dL).   Assessment: 70 yoM admitted for left foot infection at second toe s/p left great toe amputation/osteomyelitis. Pt was on warfarin PTA for hx of PE. Pt will be scheduled for surgery next week, warfarin will be held until then. Pharmacy consulted to dose heparin.  Last dose of warfarin taken on 3/4. INR down to 1.7.  PTA dose: 2.5 mg PO daily  Heparin level up to supratherapeutic (0.84) on gtt at 1450 units/hr. No bleeding noted.   Goal of Therapy:  Heparin level 0.3-0.7 units/ml Monitor platelets by anticoagulation protocol: Yes   Plan:  Decrease heparin infusion to 1300 units/hr F/u 6 hr heparin level  Sherlon Handing, PharmD, BCPS Please see amion for complete clinical pharmacist phone list 01/13/2020 6:12 AM

## 2020-01-13 NOTE — Progress Notes (Signed)
[place] for heparin Indication: hx recurrent PE  Allergies  Allergen Reactions  . Other Other (See Comments)    "Cactus" caused blisters on tongue (if prepared as food)    Patient Measurements: Height: 5\' 9"  (175.3 cm) Weight: 199 lb (90.3 kg) IBW/kg (Calculated) : 70.7 Heparin Dosing Weight: 90 kg  Vital Signs: Temp: 98.2 F (36.8 C) (03/07 1530) Temp Source: Oral (03/07 1530) BP: 108/86 (03/07 1530) Pulse Rate: 92 (03/07 1530)  Labs: Recent Labs    01/11/20 2230 01/11/20 2230 01/12/20 0208 01/12/20 1942 01/13/20 0436 01/13/20 1205 01/13/20 1814  HGB 13.6   < > 12.8*  --  11.1*  --   --   HCT 41.0  --  38.1*  --  34.3*  --   --   PLT 209  --  185  --  178  --   --   LABPROT 24.5*  --  25.3*  --  20.0*  --   --   INR 2.2*  --  2.3*  --  1.7*  --   --   HEPARINUNFRC  --   --   --    < > 0.84* 0.55 0.76*  CREATININE 0.88  --  0.90  --  0.94  --   --    < > = values in this interval not displayed.    Estimated Creatinine Clearance: 92.8 mL/min (by C-G formula based on SCr of 0.94 mg/dL).   Assessment: 22 yoM admitted for left foot infection at second toe s/p left great toe amputation/osteomyelitis. Pt was on warfarin PTA for hx of PE. Pt will be scheduled for surgery next week, warfarin will be held until then. Pharmacy consulted to dose heparin.  Last dose of warfarin taken on 3/4. INR down to 1.7.  PTA dose: 2.5 mg PO daily  Heparin level above goal at 0.76 after rate reduction to 1300 units/hr and therapeutic level this morning. No bleeding noted.  Goal of Therapy:  Heparin level 0.3-0.7 units/ml Monitor platelets by anticoagulation protocol: Yes   Plan:  Reduce heparin infusion to 1150 units/hr  Daily heparin level, CBC, s/sx of bleeding Daily INR F/u surgical plans  Erin Hearing PharmD., BCPS Clinical Pharmacist 01/13/2020 7:23 PM

## 2020-01-13 NOTE — Plan of Care (Signed)
  Problem: Health Behavior/Discharge Planning: Goal: Ability to manage health-related needs will improve Outcome: Progressing   Problem: Clinical Measurements: Goal: Ability to maintain clinical measurements within normal limits will improve Outcome: Progressing   Problem: Activity: Goal: Risk for activity intolerance will decrease Outcome: Progressing   Problem: Nutrition: Goal: Adequate nutrition will be maintained Outcome: Progressing   Problem: Coping: Goal: Level of anxiety will decrease Outcome: Progressing   Problem: Pain Managment: Goal: General experience of comfort will improve Outcome: Progressing   Problem: Elimination: Goal: Will not experience complications related to bowel motility Outcome: Progressing   Problem: Safety: Goal: Ability to remain free from injury will improve Outcome: Progressing   Problem: Skin Integrity: Goal: Risk for impaired skin integrity will decrease Outcome: Progressing

## 2020-01-13 NOTE — Progress Notes (Signed)
PROGRESS NOTE    Leonard Bentley  DJS:[id number] DOB: 1960/11/21 DOA: 01/11/2020 PCP: [name], NP   Brief Narrative:  HPI: Leonard Bentley is a 59 y.o. male with medical history significant for severe mitral regurgitation s/p mitral valve repair and recurrent PE on Coumadin, history of SVT/PAT/PACs, chronic combined systolic and diastolic CHF (EF improved to 55-60%), history of stage II non-Hodgkin's lymphoma s/p chemotherapy with CHOP/Rituxan on observation, left first toe osteomyelitis s/p left great toe amputation, depression/anxiety, alcohol use, and tobacco use who presents to the ED for evaluation of left foot infection.  Patient reports progressive pain on his left foot.  He has noticed change in appearance of his left second toe with clear drainage.  He has been having difficulty ambulating because of the pain.  He is feeling generally weak and tired.  He says he becomes fatigued easily after walking for short time.  He denies any fevers but reports chills.  He denies any chest pain, cough, nausea, vomiting, abdominal pain, dysuria, diarrhea.  He is on Coumadin for his history of mitral valve repair and prior PE.  He reports falling on his right elbow 3 months ago with continued pain whenever he puts pressure on his elbow.  He reports smoking 1 pack/[place] of cigarettes.  He reports drinking at least 1 bottle of wine daily with last drink 12 hours ago.  ED Course:  Initial vitals showed BP 130/85, pulse 84, RR 20, temp 98.5 Fahrenheit, SPO2 99% on room air.  Labs notable for WBC 4.3, hemoglobin 13.6, platelets 209,000, sodium 136, potassium 4.3, bicarb 22, BUN 6, creatinine 0.88, AST 105, ALT 79, alk phos 57, total bilirubin 0.8, serum glucose 114, lactic acid 3.3, INR 2.2.  Blood cultures were obtained and pending.  SARS-CoV-2 PCR was ordered and pending.  Left foot x-ray showed prior amputation of the great toe at the MTP joint with some rare fraction of the distal aspect of the  MTP without focal destruction.  2 view chest x-ray showed prior mitral valve replacement without active disease.  Right elbow x-ray was negative for evidence of fracture, dislocation, or joint effusion.  Patient was given IV Zofran and Dilaudid.  Hospitalist service was consulted admit for further evaluation and management.  Assessment & Plan:   Principal Problem:   Left foot infection Active Problems:   History of pulmonary embolus (PE)   S/P minimally-invasive mitral valve repair   Chronic combined systolic and diastolic heart failure (HCC)   Cellulitis of left foot   Left foot infection at second toe/s/p left great toe amputation/left toe osteomyelitis: CT foot confirms left toe osteomyelitis.  Consulted orthopedics.  Seen by Dr. [name].  Plan for surgery next week.  Recommendation to continue to hold off antibiotics.  Continue current pain management.  Continue to hold Coumadin.  Severe mitral regurgitation s/p mitral valve repair and history of PE: Patient takes Coumadin at home which is on hold due to anticipated surgery.  Now that he is going to have surgery sometime next week, continue heparin drip instead of Coumadin.  History of SVT/PAT/PACs: Controlled.  Continue Toprol-XL.  Hypomagnesemia: Resolved.  Recheck in the morning.  Chronic combined systolic and diastolic CHF: EF improved to 55-60%, he is no longer requiring diuretic therapy.  He is overall volume depleted.  Continue IV fluids as above.  Alcohol use: Reports drinking at least 1 bottle of wine nightly with last drink 12 hours prior to admission.  No signs of withdrawal.  Continue  CIWA protocol with as needed Ativan.    History of stage II non-Hodgkin's lymphoma: S/p chemotherapy with CHOP/Rituxan currently in observation.  Follows with oncology, Dr. Lorna Few.  Tobacco use: Smokes 1 pack/[place].  Advised on cutting back and eventual cessation.  Nicotine patch   DVT prophylaxis: Heparin drip    Code Status: Full Code  Family Communication:  None present at bedside.  Plan of care discussed with patient in length and he verbalized understanding and agreed with it. Patient is from: Home Disposition Plan: To be determined based on clinical course Barriers to discharge: Surgical procedure pending   Estimated body mass index is 29.39 kg/m as calculated from the following:   Height as of this encounter: '5\' 9"'$  (1.753 m).   Weight as of this encounter: 90.3 kg.      Nutritional status:               Consultants:   Orthopedics  Procedures:   None  Antimicrobials:   None   Subjective: Seen and examined.  No complaints.  Objective: Vitals:   01/12/20 1430 01/12/20 1941 01/13/20 0508 01/13/20 0738  BP: 122/76 106/84 (!) 119/98 109/88  Pulse: 97 (!) 102 (!) 101 91  Resp:    18  Temp:  98.5 F (36.9 C) 98.6 F (37 C) 97.7 F (36.5 C)  TempSrc:  Oral Oral Oral  SpO2:  98% 97% 99%  Weight:      Height:        Intake/Output Summary (Last 24 hours) at 01/13/2020 1414 Last data filed at 01/13/2020 1100 Gross per 24 hour  Intake 2829.48 ml  Output --  Net 2829.48 ml   Filed Weights   01/11/20 1931  Weight: 90.3 kg    Examination:  General exam: Appears calm and comfortable  Respiratory system: Clear to auscultation. Respiratory effort normal. Cardiovascular system: S1 & S2 heard, RRR. No JVD, murmurs, rubs, gallops or clicks. No pedal edema. Gastrointestinal system: Abdomen is nondistended, soft and nontender. No organomegaly or masses felt. Normal bowel sounds heard. Central nervous system: Alert and oriented. No focal neurological deficits. Extremities: Symmetric 5 x 5 power.  Status post amputation of left great toe.  Open ulceration of the left second toe with some drainage.  Tender to palpation. Skin: No rashes, lesions or ulcers.  Psychiatry: Judgement and insight appear normal. Mood & affect appropriate.          Data Reviewed: I have  personally reviewed following labs and imaging studies  CBC: Recent Labs  Lab 01/11/20 2230 01/12/20 0208 01/13/20 0436  WBC 4.3 4.1 3.5*  NEUTROABS 3.4  --   --   HGB 13.6 12.8* 11.1*  HCT 41.0 38.1* 34.3*  MCV 83.0 81.6 85.1  PLT 209 185 416   Basic Metabolic Panel: Recent Labs  Lab 01/11/20 2230 01/12/20 0208 01/13/20 0436  NA 136 136 138  K 4.3 4.6 4.3  CL 100 101 103  CO2 '22 23 24  '$ GLUCOSE 114* 102* 99  BUN 6 5* 12  CREATININE 0.88 0.90 0.94  CALCIUM 8.7* 8.5* 8.4*  MG  --  1.4*  --   PHOS  --  3.8  --    GFR: Estimated Creatinine Clearance: 92.8 mL/min (by C-G formula based on SCr of 0.94 mg/dL). Liver Function Tests: Recent Labs  Lab 01/11/20 2230 01/12/20 0208  AST 105* 92*  ALT 79* 73*  ALKPHOS 57 55  BILITOT 0.8 1.3*  PROT 6.6 6.4*  ALBUMIN 3.4* 3.3*  No results for input(s): LIPASE, AMYLASE in the last 168 hours. No results for input(s): AMMONIA in the last 168 hours. Coagulation Profile: Recent Labs  Lab 01/11/20 2230 01/12/20 0208 01/13/20 0436  INR 2.2* 2.3* 1.7*   Cardiac Enzymes: No results for input(s): CKTOTAL, CKMB, CKMBINDEX, TROPONINI in the last 168 hours. BNP (last 3 results) No results for input(s): PROBNP in the last 8760 hours. HbA1C: No results for input(s): HGBA1C in the last 72 hours. CBG: No results for input(s): GLUCAP in the last 168 hours. Lipid Profile: No results for input(s): CHOL, HDL, LDLCALC, TRIG, CHOLHDL, LDLDIRECT in the last 72 hours. Thyroid Function Tests: No results for input(s): TSH, T4TOTAL, FREET4, T3FREE, THYROIDAB in the last 72 hours. Anemia Panel: No results for input(s): VITAMINB12, FOLATE, FERRITIN, TIBC, IRON, RETICCTPCT in the last 72 hours. Sepsis Labs: Recent Labs  Lab 01/11/20 2230 01/12/20 0208 01/12/20 1458  LATICACIDVEN 3.3* 2.1* 2.0*    Recent Results (from the past 240 hour(s))  Blood culture (routine x 2)     Status: None (Preliminary result)   Collection Time: 01/11/20   8:39 PM   Specimen: BLOOD  Result Value Ref Range Status   Specimen Description BLOOD SITE NOT SPECIFIED  Final   Special Requests   Final    BOTTLES DRAWN AEROBIC AND ANAEROBIC Blood Culture results may not be optimal due to an inadequate volume of blood received in culture bottles   Culture   Final    NO GROWTH 2 DAYS Performed at [facility], [place] [address] [place], [place] [address]    Report Status PENDING  Incomplete  Blood culture (routine x 2)     Status: None (Preliminary result)   Collection Time: 01/11/20 10:30 PM   Specimen: BLOOD  Result Value Ref Range Status   Specimen Description BLOOD SITE NOT SPECIFIED  Final   Special Requests AEROBIC BOTTLE ONLY Blood Culture adequate volume  Final   Culture   Final    NO GROWTH 1 [place] Performed at [facility], [place] [address], [place],  [address]    Report Status PENDING  Incomplete  SARS CORONAVIRUS 2 (TAT 6-24 HRS) Nasopharyngeal Nasopharyngeal Swab     Status: None   Collection Time: 01/12/20 12:47 AM   Specimen: Nasopharyngeal Swab  Result Value Ref Range Status   SARS Coronavirus 2 NEGATIVE NEGATIVE Final    Comment: (NOTE) SARS-CoV-2 target nucleic acids are NOT DETECTED. The SARS-CoV-2 RNA is generally detectable in upper and lower respiratory specimens during the acute phase of infection. Negative results do not preclude SARS-CoV-2 infection, do not rule out co-infections with other pathogens, and should not be used as the sole basis for treatment or other patient management decisions. Negative results must be combined with clinical observations, patient history, and epidemiological information. The expected result is Negative. Fact Sheet for Patients: SugarRoll.be Fact Sheet for Healthcare Providers: https://www.woods-mathews.com/ This test is not yet approved or cleared by the [place] FDA and  has been authorized for detection and/or diagnosis of  SARS-CoV-2 by FDA under an Emergency Use Authorization (EUA). This EUA will remain  in effect (meaning this test can be used) for the duration of the COVID-19 declaration under Section 56 4(b)(1) of the Act, 21 U.S.C. section 360bbb-3(b)(1), unless the authorization is terminated or revoked sooner. Performed at [facility], [place] [address] [place],  [address]       Radiology Studies: DG Chest 2 View  Result Date: 01/11/2020  CLINICAL DATA:  Shortness of breath.  Foot infection. EXAM: CHEST - 2 VIEW COMPARISON:  09/28/2019 FINDINGS: Previous mitral valve replacement. Heart size is normal. Aorta is tortuous. Chronic lung markings are again demonstrated, but without evidence of active infiltrate, effusion or collapse. No bone abnormality. IMPRESSION: No active disease.  Previous mitral valve replacement. Electronically Signed   By: Nelson Chimes M.D.   On: 01/11/2020 22:01   DG ELBOW COMPLETE RIGHT (3+VIEW)  Result Date: 01/11/2020 CLINICAL DATA:  Foot infection.  Elbow pain. EXAM: RIGHT ELBOW - COMPLETE 3+ VIEW COMPARISON:  None. FINDINGS: There is no evidence of fracture, dislocation, or joint effusion. There is no evidence of arthropathy or other focal bone abnormality. Soft tissues are unremarkable. IMPRESSION: Negative. Electronically Signed   By: Nelson Chimes M.D.   On: 01/11/2020 22:02   CT FOOT LEFT W CONTRAST  Result Date: 01/12/2020 CLINICAL DATA:  Assess for left foot osteomyelitis. Foot infection. Patient states he cannot tolerate MRI. EXAM: CT OF THE LOWER LEFT EXTREMITY WITH CONTRAST TECHNIQUE: Multidetector CT imaging of the lower left extremity was performed according to the standard protocol following intravenous contrast administration. COMPARISON:  Radiograph yesterday. CONTRAST:  190m OMNIPAQUE IOHEXOL 300 MG/ML  SOLN FINDINGS: Bones/Joint/Cartilage Prior great toe amputation. There is cortical irregularity about the distal first metatarsal head possible small  erosions. No other findings suspicious for osteomyelitis of the foot. Generalized bony demineralization. There are hammertoe deformity of the digits. No acute fracture. Well corticated density adjacent to the anterior talus may represent sequela of remote prior injury. Minor fragmentation adjacent to the anterior calcaneus is chronic. Ligaments Suboptimally assessed by CT. Muscles and Tendons No intramuscular fluid collection. Mild fatty atrophy of the intrinsic musculature of the foot. Soft tissues Soft tissue thickening adjacent to the first metatarsal head at the operative bed with associated skin thickening. No focal fluid collection or abscess. Mild generalized subcutaneous soft tissue edema. IMPRESSION: 1. Post great toe amputation. Cortical irregularity about the distal first metatarsal head with possible small erosions, suspicious for osteomyelitis. Adjacent soft tissue thickening with associated skin thickening. No focal fluid collection or abscess. 2. No other sites concerning for osteomyelitis. Electronically Signed   By: MKeith RakeM.D.   On: 01/12/2020 00:56   DG Foot Complete Left  Result Date: 01/11/2020 CLINICAL DATA:  Foot infection. Toe amputation 10 months ago. Oozing and bleeding from the amputation site. EXAM: LEFT FOOT - COMPLETE 3+ VIEW COMPARISON:  01/09/2019 FINDINGS: Amputation of the great toe at the metatarsal phalangeal joint. Some degree of rare fraction of the distal metatarsal, which could be due to disuse osteopenia. No clear destructive focus to allow diagnosis of osteomyelitis. The other toes appear normal. The more proximal foot appears normal. IMPRESSION: Amputation of the great toe at the MTP joint. Some rare fraction of the distal aspect of the MTP, but without focal destruction. Cannot definitely establish osteomyelitis based on this appearance. Electronically Signed   By: MNelson ChimesM.D.   On: 01/11/2020 22:04    Scheduled Meds: . folic acid  1 mg Oral Daily    . metoprolol succinate  50 mg Oral Daily  . multivitamin with minerals  1 tablet Oral Daily  . nicotine  21 mg Transdermal Daily  . thiamine  100 mg Oral Daily   Or  . thiamine  100 mg Intravenous Daily   Continuous Infusions: . sodium chloride 100 mL/hr at 01/13/20 1100  . heparin 1,300 Units/hr (01/13/20 1100)     LOS:  1 Nusz   Time spent: 28 minutes   Darliss Cheney, MD [facility]  01/13/2020, 2:14 PM   To contact the attending provider between 7A-7P or the covering provider during after hours 7P-7A, please log into the web site [email or web address].

## 2020-01-14 LAB — PROTIME-INR
INR: 1.3 — ABNORMAL HIGH (ref 0.8–1.2)
Prothrombin Time: 16.4 seconds — ABNORMAL HIGH (ref 11.4–15.2)

## 2020-01-14 LAB — CBC
HCT: 33.2 % — ABNORMAL LOW (ref 39.0–52.0)
Hemoglobin: 10.6 g/dL — ABNORMAL LOW (ref 13.0–17.0)
MCH: 27.2 pg (ref 26.0–34.0)
MCHC: 31.9 g/dL (ref 30.0–36.0)
MCV: 85.3 fL (ref 80.0–100.0)
Platelets: 166 10*3/uL (ref 150–400)
RBC: 3.89 MIL/uL — ABNORMAL LOW (ref 4.22–5.81)
RDW: 16.7 % — ABNORMAL HIGH (ref 11.5–15.5)
WBC: 2.8 10*3/uL — ABNORMAL LOW (ref 4.0–10.5)
nRBC: 1.4 % — ABNORMAL HIGH (ref 0.0–0.2)

## 2020-01-14 LAB — HEPARIN LEVEL (UNFRACTIONATED): Heparin Unfractionated: 0.67 IU/mL (ref 0.30–0.70)

## 2020-01-14 LAB — MAGNESIUM: Magnesium: 1.5 mg/dL — ABNORMAL LOW (ref 1.7–2.4)

## 2020-01-14 LAB — POTASSIUM: Potassium: 4.1 mmol/L (ref 3.5–5.1)

## 2020-01-14 NOTE — Progress Notes (Signed)
PROGRESS NOTE    Leonard Bentley  SPQ:[id number] DOB: Apr 28, 1961 DOA: 01/11/2020 PCP: Leonard Pounds, NP   Brief Narrative:  HPI: Leonard Bentley is a 59 y.o. male with medical history significant for severe mitral regurgitation s/p mitral valve repair and recurrent PE on Coumadin, history of SVT/PAT/PACs, chronic combined systolic and diastolic CHF (EF improved to 55-60%), history of stage II non-Hodgkin's lymphoma s/p chemotherapy with CHOP/Rituxan on observation, left first toe osteomyelitis s/p left great toe amputation, depression/anxiety, alcohol use, and tobacco use who presents to the ED for evaluation of left foot infection.  Patient reports progressive pain on his left foot.  He has noticed change in appearance of his left second toe with clear drainage.  He has been having difficulty ambulating because of the pain.  He is feeling generally weak and tired.  He says he becomes fatigued easily after walking for short time.  He denies any fevers but reports chills.  He denies any chest pain, cough, nausea, vomiting, abdominal pain, dysuria, diarrhea.  He is on Coumadin for his history of mitral valve repair and prior PE.  He reports falling on his right elbow 3 months ago with continued pain whenever he puts pressure on his elbow.  He reports smoking 1 pack/Pearman of cigarettes.  He reports drinking at least 1 bottle of wine daily with last drink 12 hours ago.  ED Course:  Initial vitals showed BP 130/85, pulse 84, RR 20, temp 98.5 Fahrenheit, SPO2 99% on room air.  Labs notable for WBC 4.3, hemoglobin 13.6, platelets 209,000, sodium 136, potassium 4.3, bicarb 22, BUN 6, creatinine 0.88, AST 105, ALT 79, alk phos 57, total bilirubin 0.8, serum glucose 114, lactic acid 3.3, INR 2.2.  Blood cultures were obtained and pending.  SARS-CoV-2 PCR was ordered and pending.  Left foot x-ray showed prior amputation of the great toe at the MTP joint with some rare fraction of the distal aspect of the  MTP without focal destruction.  2 view chest x-ray showed prior mitral valve replacement without active disease.  Right elbow x-ray was negative for evidence of fracture, dislocation, or joint effusion.  Patient was Bentley IV Zofran and Dilaudid.  Hospitalist service was consulted admit for further evaluation and management.  Assessment & Plan:   Principal Problem:   Left foot infection Active Problems:   History of pulmonary embolus (PE)   S/P minimally-invasive mitral valve repair   Chronic combined systolic and diastolic heart failure (HCC)   Cellulitis of left foot   Left foot infection at second toe/s/p left great toe amputation/left toe osteomyelitis: CT foot confirms left toe osteomyelitis.  Consulted orthopedics.  Seen by Leonard Bentley.  Plan for surgery next week.  Recommendation to continue to hold off antibiotics.  Continue current pain management.  Continue to hold Coumadin.  Severe mitral regurgitation s/p mitral valve repair and history of PE: Patient takes Coumadin at home which is on hold due to anticipated surgery.  Now that he is going to have surgery sometime next week, continue heparin drip instead of Coumadin.  History of SVT/PAT/PACs: Controlled.  Continue Toprol-XL.  Hypomagnesemia: Rechecking today.  Chronic combined systolic and diastolic CHF: EF improved to 55-60%, he is no longer requiring diuretic therapy.  He is overall volume depleted.  Continue IV fluids as above.  Alcohol use: Reports drinking at least 1 bottle of wine nightly with last drink 12 hours prior to admission.  Has anxiety and obvious gross tremors in upper extremities.  CIWA as high as 6 in last 24 hours but seems to be higher than that.  Continue CIWA protocol with as needed Ativan.  History of stage II non-Hodgkin's lymphoma: S/p chemotherapy with CHOP/Rituxan currently in observation.  Follows with oncology, Dr. Lorna Bentley.  Tobacco use: Smokes 1 pack/[place].  Advised on  cutting back and eventual cessation.  Nicotine patch   DVT prophylaxis: Heparin drip   Code Status: Full Code  Family Communication:  None present at bedside.  Plan of care discussed with patient in length and he verbalized understanding and agreed with it. Patient is from: Home Disposition Plan: To be determined based on clinical course Barriers to discharge: Surgical procedure pending   Estimated body mass index is 29.39 kg/m as calculated from the following:   Height as of this encounter: '5\' 9"'$  (1.753 m).   Weight as of this encounter: 90.3 kg.      Nutritional status:               Consultants:   Orthopedics  Procedures:   None  Antimicrobials:   None   Subjective: Seen and examined.  Foot pain is improved but now he has anxiety and tremors.  No other complaint.  He is alert and oriented.  Objective: Vitals:   01/13/20 1530 01/13/20 2006 01/14/20 0453 01/14/20 0744  BP: 108/86 111/86 (!) 121/95 133/75  Pulse: 92 89 83 85  Resp: '18 19 15 16  '$ Temp: 98.2 F (36.8 C) 98.4 F (36.9 C) 98.1 F (36.7 C) 98.6 F (37 C)  TempSrc: Oral Oral Oral Oral  SpO2: 98% 99% 99% 100%  Weight:      Height:        Intake/Output Summary (Last 24 hours) at 01/14/2020 1125 Last data filed at 01/14/2020 1015 Gross per 24 hour  Intake 2782.68 ml  Output 1110 ml  Net 1672.68 ml   Filed Weights   01/11/20 1931  Weight: 90.3 kg    Examination:  General exam: Appears anxious and tremulous Respiratory system: Clear to auscultation. Respiratory effort normal. Cardiovascular system: S1 & S2 heard, RRR. No JVD, murmurs, rubs, gallops or clicks. No pedal edema. Gastrointestinal system: Abdomen is nondistended, soft and nontender. No organomegaly or masses felt. Normal bowel sounds heard. Central nervous system: Alert and oriented. No focal neurological deficits. Extremities: Symmetric 5 x 5 power.  Left second toe edematous and necrotic. Skin: No rashes, lesions or  ulcers.  Psychiatry: Judgement and insight appear normal. Mood & affect appropriate.           Data Reviewed: I have personally reviewed following labs and imaging studies  CBC: Recent Labs  Lab 01/11/20 2230 01/12/20 0208 01/13/20 0436 01/14/20 0213  WBC 4.3 4.1 3.5* 2.8*  NEUTROABS 3.4  --   --   --   HGB 13.6 12.8* 11.1* 10.6*  HCT 41.0 38.1* 34.3* 33.2*  MCV 83.0 81.6 85.1 85.3  PLT 209 185 178 397   Basic Metabolic Panel: Recent Labs  Lab 01/11/20 2230 01/12/20 0208 01/13/20 0436  NA 136 136 138  K 4.3 4.6 4.3  CL 100 101 103  CO2 '22 23 24  '$ GLUCOSE 114* 102* 99  BUN 6 5* 12  CREATININE 0.88 0.90 0.94  CALCIUM 8.7* 8.5* 8.4*  MG  --  1.4*  --   PHOS  --  3.8  --    GFR: Estimated Creatinine Clearance: 92.8 mL/min (by C-G formula based on SCr of 0.94 mg/dL). Liver Function Tests: Recent  Labs  Lab 01/11/20 2230 01/12/20 0208  AST 105* 92*  ALT 79* 73*  ALKPHOS 57 55  BILITOT 0.8 1.3*  PROT 6.6 6.4*  ALBUMIN 3.4* 3.3*   No results for input(s): LIPASE, AMYLASE in the last 168 hours. No results for input(s): AMMONIA in the last 168 hours. Coagulation Profile: Recent Labs  Lab 01/11/20 2230 01/12/20 0208 01/13/20 0436 01/14/20 0213  INR 2.2* 2.3* 1.7* 1.3*   Cardiac Enzymes: No results for input(s): CKTOTAL, CKMB, CKMBINDEX, TROPONINI in the last 168 hours. BNP (last 3 results) No results for input(s): PROBNP in the last 8760 hours. HbA1C: No results for input(s): HGBA1C in the last 72 hours. CBG: No results for input(s): GLUCAP in the last 168 hours. Lipid Profile: No results for input(s): CHOL, HDL, LDLCALC, TRIG, CHOLHDL, LDLDIRECT in the last 72 hours. Thyroid Function Tests: No results for input(s): TSH, T4TOTAL, FREET4, T3FREE, THYROIDAB in the last 72 hours. Anemia Panel: No results for input(s): VITAMINB12, FOLATE, FERRITIN, TIBC, IRON, RETICCTPCT in the last 72 hours. Sepsis Labs: Recent Labs  Lab 01/11/20 2230 01/12/20  0208 01/12/20 1458  LATICACIDVEN 3.3* 2.1* 2.0*    Recent Results (from the past 240 hour(s))  Blood culture (routine x 2)     Status: None (Preliminary result)   Collection Time: 01/11/20  8:39 PM   Specimen: BLOOD  Result Value Ref Range Status   Specimen Description BLOOD SITE NOT SPECIFIED  Final   Special Requests   Final    BOTTLES DRAWN AEROBIC AND ANAEROBIC Blood Culture results may not be optimal due to an inadequate volume of blood received in culture bottles   Culture   Final    NO GROWTH 3 DAYS Performed at [facility], [place] [address] [place], [place] [address]    Report Status PENDING  Incomplete  Blood culture (routine x 2)     Status: None (Preliminary result)   Collection Time: 01/11/20 10:30 PM   Specimen: BLOOD  Result Value Ref Range Status   Specimen Description BLOOD SITE NOT SPECIFIED  Final   Special Requests AEROBIC BOTTLE ONLY Blood Culture adequate volume  Final   Culture   Final    NO GROWTH 2 DAYS Performed at [facility], [place] [address], [place], [place] [address]    Report Status PENDING  Incomplete  SARS CORONAVIRUS 2 (TAT 6-24 HRS) Nasopharyngeal Nasopharyngeal Swab     Status: None   Collection Time: 01/12/20 12:47 AM   Specimen: Nasopharyngeal Swab  Result Value Ref Range Status   SARS Coronavirus 2 NEGATIVE NEGATIVE Final    Comment: (NOTE) SARS-CoV-2 target nucleic acids are NOT DETECTED. The SARS-CoV-2 RNA is generally detectable in upper and lower respiratory specimens during the acute phase of infection. Negative results do not preclude SARS-CoV-2 infection, do not rule out co-infections with other pathogens, and should not be used as the sole basis for treatment or other patient management decisions. Negative results must be combined with clinical observations, patient history, and epidemiological information. The expected result is Negative. Fact Sheet for Patients: SugarRoll.be Fact  Sheet for Healthcare Providers: https://www.woods-mathews.com/ This test is not yet approved or cleared by the [place] FDA and  has been authorized for detection and/or diagnosis of SARS-CoV-2 by FDA under an Emergency Use Authorization (EUA). This EUA will remain  in effect (meaning this test can be used) for the duration of the COVID-19 declaration under Section 56 4(b)(1) of the Act, 21 U.S.C. section 360bbb-3(b)(1), unless  the authorization is terminated or revoked sooner. Performed at [facility], [place] [address], [place], [place] [address]       Radiology Studies: No results found.  Scheduled Meds: . folic acid  1 mg Oral Daily  . metoprolol succinate  50 mg Oral Daily  . multivitamin with minerals  1 tablet Oral Daily  . nicotine  21 mg Transdermal Daily  . thiamine  100 mg Oral Daily   Or  . thiamine  100 mg Intravenous Daily   Continuous Infusions: . sodium chloride 100 mL/hr at 01/14/20 1031  . heparin 1,150 Units/hr (01/14/20 1015)     LOS: 2 days   Time spent: 27 minutes   Darliss Cheney, MD [facility]  01/14/2020, 11:25 AM   To contact the attending provider between 7A-7P or the covering provider during after hours 7P-7A, please log into the web site [email or web address].

## 2020-01-14 NOTE — Progress Notes (Signed)
[place] for heparin Indication: hx recurrent PE  Allergies  Allergen Reactions  . Other Other (See Comments)    "Cactus" caused blisters on tongue (if prepared as food)    Patient Measurements: Height: 5\' 9"  (175.3 cm) Weight: 199 lb (90.3 kg) IBW/kg (Calculated) : 70.7 Heparin Dosing Weight: 90 kg  Vital Signs: Temp: 98.6 F (37 C) (03/08 0744) Temp Source: Oral (03/08 0744) BP: 133/75 (03/08 0744) Pulse Rate: 85 (03/08 0744)  Labs: Recent Labs    01/11/20 2230 01/11/20 2230 01/12/20 0208 01/12/20 1942 01/13/20 0436 01/13/20 0436 01/13/20 1205 01/13/20 1814 01/14/20 0213  HGB 13.6   < > 12.8*  --  11.1*  --   --   --  10.6*  HCT 41.0   < > 38.1*  --  34.3*  --   --   --  33.2*  PLT 209   < > 185  --  178  --   --   --  166  LABPROT 24.5*   < > 25.3*  --  20.0*  --   --   --  16.4*  INR 2.2*   < > 2.3*  --  1.7*  --   --   --  1.3*  HEPARINUNFRC  --   --   --    < > 0.84*   < > 0.55 0.76* 0.67  CREATININE 0.88  --  0.90  --  0.94  --   --   --   --    < > = values in this interval not displayed.    Estimated Creatinine Clearance: 92.8 mL/min (by C-G formula based on SCr of 0.94 mg/dL).   Assessment: 59 yoM admitted for left foot infection at second toe s/p left great toe amputation/osteomyelitis. Pt was on warfarin PTA for hx of PE. Pt will be scheduled for surgery this week, warfarin will be held until then. Pharmacy consulted to dose heparin.  Last dose of warfarin taken on 3/4. INR down to 1.3 today 01/14/20.  PTA dose: 2.5 mg PO daily  Heparin level is 0.67, therapeutic after rate reduction to 1150 units/hr. No bleeding noted.  Goal of Therapy:  Heparin level 0.3-0.7 units/ml Monitor platelets by anticoagulation protocol: Yes   Plan:  Continue heparin infusion 1150 units/hr  Daily heparin level, CBC, s/sx of bleeding Daily INR F/u surgical plans  Thank you for allowing pharmacy to be part of this patients care  team. Nicole Cella, RPh Clinical Pharmacist 01/14/2020 11:42 AM

## 2020-01-14 NOTE — Plan of Care (Signed)

## 2020-01-15 ENCOUNTER — Other Ambulatory Visit: Payer: Self-pay | Admitting: Physician Assistant

## 2020-01-15 LAB — PHOSPHORUS: Phosphorus: 3.3 mg/dL (ref 2.5–4.6)

## 2020-01-15 LAB — BASIC METABOLIC PANEL
Anion gap: 10 (ref 5–15)
Anion gap: 8 (ref 5–15)
BUN: 10 mg/dL (ref 6–20)
BUN: 8 mg/dL (ref 6–20)
CO2: 21 mmol/L — ABNORMAL LOW (ref 22–32)
CO2: 21 mmol/L — ABNORMAL LOW (ref 22–32)
Calcium: 8.7 mg/dL — ABNORMAL LOW (ref 8.9–10.3)
Calcium: 8.5 mg/dL — ABNORMAL LOW (ref 8.9–10.3)
Chloride: 104 mmol/L (ref 98–111)
Chloride: 107 mmol/L (ref 98–111)
Creatinine, Ser: 0.85 mg/dL (ref 0.61–1.24)
Creatinine, Ser: 0.8 mg/dL (ref 0.61–1.24)
GFR calc Af Amer: >60 mL/min (ref >60)
GFR calc Af Amer: >60 mL/min (ref >60)
GFR calc non Af Amer: >60 mL/min (ref >60)
GFR calc non Af Amer: >60 mL/min (ref >60)
Glucose, Bld: 105 mg/dL — ABNORMAL HIGH (ref 70–99)
Glucose, Bld: 103 mg/dL — ABNORMAL HIGH (ref 70–99)
Potassium: 3.8 mmol/L (ref 3.5–5.1)
Potassium: 4 mmol/L (ref 3.5–5.1)
Sodium: 135 mmol/L (ref 135–145)
Sodium: 136 mmol/L (ref 135–145)

## 2020-01-15 LAB — CBC
HCT: 30.8 % — ABNORMAL LOW (ref 39.0–52.0)
Hemoglobin: 10.2 g/dL — ABNORMAL LOW (ref 13.0–17.0)
MCH: 27.5 pg (ref 26.0–34.0)
MCHC: 33.1 g/dL (ref 30.0–36.0)
MCV: 83 fL (ref 80.0–100.0)
Platelets: 172 10*3/uL (ref 150–400)
RBC: 3.71 MIL/uL — ABNORMAL LOW (ref 4.22–5.81)
RDW: 16.2 % — ABNORMAL HIGH (ref 11.5–15.5)
WBC: 2.9 10*3/uL — ABNORMAL LOW (ref 4.0–10.5)
nRBC: 1 % — ABNORMAL HIGH (ref 0.0–0.2)

## 2020-01-15 LAB — SURGICAL PCR SCREEN
MRSA, PCR: NEGATIVE
Staphylococcus aureus: NEGATIVE

## 2020-01-15 LAB — PROTIME-INR
INR: 1.2 (ref 0.8–1.2)
Prothrombin Time: 15 seconds (ref 11.4–15.2)

## 2020-01-15 LAB — HEPARIN LEVEL (UNFRACTIONATED): Heparin Unfractionated: 0.47 IU/mL (ref 0.30–0.70)

## 2020-01-15 LAB — MAGNESIUM: Magnesium: 1.3 mg/dL — ABNORMAL LOW (ref 1.7–2.4)

## 2020-01-15 MED ORDER — THIAMINE HCL 100 MG/ML IJ SOLN
100.0000 mg | Freq: Every day | INTRAMUSCULAR | Status: DC
  Filled 2020-01-15: qty 2

## 2020-01-15 MED ORDER — FOLIC ACID 1 MG PO TABS
1.0000 mg | ORAL_TABLET | Freq: Every day | ORAL | Status: DC
  Administered 2020-01-15 – 2020-01-18 (×3): 1 mg via ORAL
  Filled 2020-01-15 (×3): qty 1

## 2020-01-15 MED ORDER — THIAMINE HCL 100 MG PO TABS
100.0000 mg | ORAL_TABLET | Freq: Every day | ORAL | Status: DC
  Administered 2020-01-15 – 2020-01-18 (×3): 100 mg via ORAL
  Filled 2020-01-15 (×3): qty 1

## 2020-01-15 MED ORDER — MAGNESIUM SULFATE 4 GM/100ML IV SOLN
4.0000 g | Freq: Once | INTRAVENOUS | Status: AC
  Administered 2020-01-15: 4 g via INTRAVENOUS
  Filled 2020-01-15: qty 100

## 2020-01-15 MED ORDER — LORAZEPAM 2 MG/ML IJ SOLN: 1.0000 mg | INTRAMUSCULAR | Status: AC | PRN

## 2020-01-15 MED ORDER — ADULT MULTIVITAMIN W/MINERALS CH
1.0000 | ORAL_TABLET | Freq: Every day | ORAL | Status: DC
  Administered 2020-01-15 – 2020-01-18 (×3): 1 via ORAL
  Filled 2020-01-15 (×3): qty 1

## 2020-01-15 MED ORDER — LORAZEPAM 1 MG PO TABS
1.0000 mg | ORAL_TABLET | ORAL | Status: AC | PRN
  Administered 2020-01-15 (×3): 1 mg via ORAL
  Administered 2020-01-16: 2 mg via ORAL
  Administered 2020-01-16 (×2): 1 mg via ORAL
  Administered 2020-01-17: 2 mg via ORAL
  Administered 2020-01-17 (×2): 1 mg via ORAL
  Filled 2020-01-15: qty 1
  Filled 2020-01-15 (×3): qty 2
  Filled 2020-01-15 (×5): qty 1

## 2020-01-15 MED ORDER — CHLORHEXIDINE GLUCONATE 4 % EX LIQD: 60.0000 mL | Freq: Once | CUTANEOUS | Status: DC

## 2020-01-15 NOTE — Progress Notes (Signed)
PROGRESS NOTE    Leonard Bentley  POE:[id number] DOB: 12-29-1960 DOA: 01/11/2020 PCP: [name], NP   Brief Narrative:  HPI: Leonard Bentley is a 59 y.o. male with medical history significant for severe mitral regurgitation s/p mitral valve repair and recurrent PE on Coumadin, history of SVT/PAT/PACs, chronic combined systolic and diastolic CHF (EF improved to 55-60%), history of stage II non-Hodgkin's lymphoma s/p chemotherapy with CHOP/Rituxan on observation, left first toe osteomyelitis s/p left great toe amputation, depression/anxiety, alcohol use, and tobacco use who presents to the ED for evaluation of left foot infection.  Patient reports progressive pain on his left foot.  He has noticed change in appearance of his left second toe with clear drainage.  He has been having difficulty ambulating because of the pain.  He is feeling generally weak and tired.  He says he becomes fatigued easily after walking for short time.  He denies any fevers but reports chills.  He denies any chest pain, cough, nausea, vomiting, abdominal pain, dysuria, diarrhea.  He is on Coumadin for his history of mitral valve repair and prior PE.  He reports falling on his right elbow 3 months ago with continued pain whenever he puts pressure on his elbow.  He reports smoking 1 pack/Pedretti of cigarettes.  He reports drinking at least 1 bottle of wine daily with last drink 12 hours ago.  ED Course:  Initial vitals showed BP 130/85, pulse 84, RR 20, temp 98.5 Fahrenheit, SPO2 99% on room air.  Labs notable for WBC 4.3, hemoglobin 13.6, platelets 209,000, sodium 136, potassium 4.3, bicarb 22, BUN 6, creatinine 0.88, AST 105, ALT 79, alk phos 57, total bilirubin 0.8, serum glucose 114, lactic acid 3.3, INR 2.2.  Blood cultures were obtained and pending.  SARS-CoV-2 PCR was ordered and pending.  Left foot x-ray showed prior amputation of the great toe at the MTP joint with some rare fraction of the distal aspect of the  MTP without focal destruction.  2 view chest x-ray showed prior mitral valve replacement without active disease.  Right elbow x-ray was negative for evidence of fracture, dislocation, or joint effusion.  Patient was given IV Zofran and Dilaudid.  Hospitalist service was consulted admit for further evaluation and management.  Assessment & Plan:   Principal Problem:   Left foot infection Active Problems:   History of pulmonary embolus (PE)   S/P minimally-invasive mitral valve repair   Chronic combined systolic and diastolic heart failure (HCC)   Cellulitis of left foot  Left foot infection at second toe/s/p left great toe amputation/left toe osteomyelitis: CT foot confirms left toe osteomyelitis.  Consulted orthopedics.  Seen by Dr. [name].  Plan for surgery tomorrow. Recommendation to continue to hold off antibiotics.  Continue current pain management.  Continue to hold Coumadin.  Severe mitral regurgitation s/p mitral valve repair and history of PE: Patient takes Coumadin at home which is on hold due to anticipated surgery.  Now that he is going to have surgery sometime next week, continue heparin drip instead of Coumadin.  History of SVT/PAT/PACs: Controlled.  Continue Toprol-XL.  Hypomagnesemia: Rechecking today.  Chronic combined systolic and diastolic CHF: EF improved to 55-60%, he is no longer requiring diuretic therapy.  He is overall volume depleted.  Continue IV fluids as above.  Alcohol use: Reports drinking at least 1 bottle of wine nightly with last drink 12 hours prior to admission.  Has anxiety and obvious gross tremors in upper extremities.  CIWA as high  as 5 in last 24 hours. No tremors like he had yesterday. Continue CIWA protocol. As needed Ativan.  History of stage II non-Hodgkin's lymphoma: S/p chemotherapy with CHOP/Rituxan currently in observation.  Follows with oncology, Dr. Lorna Few.  Tobacco use: Smokes 1 pack/[place].  Advised on cutting back  and eventual cessation.  Nicotine patch    DVT prophylaxis: Heparin drip   Code Status: Full Code  Family Communication:  None present at bedside.  Plan of care discussed with patient in length and he verbalized understanding and agreed with it. Patient is from: Home Disposition Plan: To be determined based on clinical course Barriers to discharge: Surgical procedure pending   Estimated body mass index is 29.39 kg/m as calculated from the following:   Height as of this encounter: '5\' 9"'$  (1.753 m).   Weight as of this encounter: 90.3 kg.      Nutritional status:               Consultants:   Orthopedics  Procedures:   None  Antimicrobials:   None   Subjective: Seen and examined. Feels better. Pain is improved.  Objective: Vitals:   01/14/20 2200 01/15/20 0442 01/15/20 0700 01/15/20 0720  BP: (!) 140/98 (!) 140/100 128/90 136/90  Pulse:  81 83 80  Resp:  16  16  Temp:  98.3 F (36.8 C)  97.9 F (36.6 C)  TempSrc:  Oral  Oral  SpO2:  99%  99%  Weight:      Height:        Intake/Output Summary (Last 24 hours) at 01/15/2020 1134 Last data filed at 01/15/2020 0814 Gross per 24 hour  Intake 2727.18 ml  Output 1100 ml  Net 1627.18 ml   Filed Weights   01/11/20 1931  Weight: 90.3 kg    Examination:  General exam: Appears calm and comfortable  Respiratory system: Clear to auscultation. Respiratory effort normal. Cardiovascular system: S1 & S2 heard, RRR. No JVD, murmurs, rubs, gallops or clicks. No pedal edema. Gastrointestinal system: Abdomen is nondistended, soft and nontender. No organomegaly or masses felt. Normal bowel sounds heard. Central nervous system: Alert and oriented. No focal neurological deficits. Extremities: Symmetric 5 x 5 power. Amputated great toe of left foot. Shallow ulcer with edema and necrosis in the left second toe. Slightly tender to palpation. Skin: No rashes, lesions or ulcers.  Psychiatry: Judgement and insight appear  normal. Mood & affect appropriate.            Data Reviewed: I have personally reviewed following labs and imaging studies  CBC: Recent Labs  Lab 01/11/20 2230 01/12/20 0208 01/13/20 0436 01/14/20 0213 01/15/20 0416  WBC 4.3 4.1 3.5* 2.8* 2.9*  NEUTROABS 3.4  --   --   --   --   HGB 13.6 12.8* 11.1* 10.6* 10.2*  HCT 41.0 38.1* 34.3* 33.2* 30.8*  MCV 83.0 81.6 85.1 85.3 83.0  PLT 209 185 178 166 149   Basic Metabolic Panel: Recent Labs  Lab 01/11/20 2230 01/11/20 2230 01/12/20 0208 01/13/20 0436 01/14/20 1202 01/15/20 0416 01/15/20 0856  NA 136  --  136 138  --  135 136  K 4.3   < > 4.6 4.3 4.1 3.8 4.0  CL 100  --  101 103  --  104 107  CO2 22  --  23 24  --  21* 21*  GLUCOSE 114*  --  102* 99  --  105* 103*  BUN 6  --  5* 12  --  10 8  CREATININE 0.88  --  0.90 0.94  --  0.85 0.80  CALCIUM 8.7*  --  8.5* 8.4*  --  8.7* 8.5*  MG  --   --  1.4*  --  1.5* 1.3*  --   PHOS  --   --  3.8  --   --  3.3  --    < > = values in this interval not displayed.   GFR: Estimated Creatinine Clearance: 109 mL/min (by C-G formula based on SCr of 0.8 mg/dL). Liver Function Tests: Recent Labs  Lab 01/11/20 2230 01/12/20 0208  AST 105* 92*  ALT 79* 73*  ALKPHOS 57 55  BILITOT 0.8 1.3*  PROT 6.6 6.4*  ALBUMIN 3.4* 3.3*   No results for input(s): LIPASE, AMYLASE in the last 168 hours. No results for input(s): AMMONIA in the last 168 hours. Coagulation Profile: Recent Labs  Lab 01/11/20 2230 01/12/20 0208 01/13/20 0436 01/14/20 0213 01/15/20 0416  INR 2.2* 2.3* 1.7* 1.3* 1.2   Cardiac Enzymes: No results for input(s): CKTOTAL, CKMB, CKMBINDEX, TROPONINI in the last 168 hours. BNP (last 3 results) No results for input(s): PROBNP in the last 8760 hours. HbA1C: No results for input(s): HGBA1C in the last 72 hours. CBG: No results for input(s): GLUCAP in the last 168 hours. Lipid Profile: No results for input(s): CHOL, HDL, LDLCALC, TRIG, CHOLHDL, LDLDIRECT in  the last 72 hours. Thyroid Function Tests: No results for input(s): TSH, T4TOTAL, FREET4, T3FREE, THYROIDAB in the last 72 hours. Anemia Panel: No results for input(s): VITAMINB12, FOLATE, FERRITIN, TIBC, IRON, RETICCTPCT in the last 72 hours. Sepsis Labs: Recent Labs  Lab 01/11/20 2230 01/12/20 0208 01/12/20 1458  LATICACIDVEN 3.3* 2.1* 2.0*    Recent Results (from the past 240 hour(s))  Blood culture (routine x 2)     Status: None (Preliminary result)   Collection Time: 01/11/20  8:39 PM   Specimen: BLOOD  Result Value Ref Range Status   Specimen Description BLOOD SITE NOT SPECIFIED  Final   Special Requests   Final    BOTTLES DRAWN AEROBIC AND ANAEROBIC Blood Culture results may not be optimal due to an inadequate volume of blood received in culture bottles   Culture   Final    NO GROWTH 3 DAYS Performed at [facility], [place] [address], [place], [place] [address]    Report Status PENDING  Incomplete  Blood culture (routine x 2)     Status: None (Preliminary result)   Collection Time: 01/11/20 10:30 PM   Specimen: BLOOD  Result Value Ref Range Status   Specimen Description BLOOD SITE NOT SPECIFIED  Final   Special Requests AEROBIC BOTTLE ONLY Blood Culture adequate volume  Final   Culture   Final    NO GROWTH 2 DAYS Performed at [facility], [place] [address], [place], [place] [address]    Report Status PENDING  Incomplete  SARS CORONAVIRUS 2 (TAT 6-24 HRS) Nasopharyngeal Nasopharyngeal Swab     Status: None   Collection Time: 01/12/20 12:47 AM   Specimen: Nasopharyngeal Swab  Result Value Ref Range Status   SARS Coronavirus 2 NEGATIVE NEGATIVE Final    Comment: (NOTE) SARS-CoV-2 target nucleic acids are NOT DETECTED. The SARS-CoV-2 RNA is generally detectable in upper and lower respiratory specimens during the acute phase of infection. Negative results do not preclude SARS-CoV-2 infection, do not rule out co-infections with other pathogens, and should not  be used as the sole basis for  treatment or other patient management decisions. Negative results must be combined with clinical observations, patient history, and epidemiological information. The expected result is Negative. Fact Sheet for Patients: SugarRoll.be Fact Sheet for Healthcare Providers: https://www.woods-mathews.com/ This test is not yet approved or cleared by the [place] FDA and  has been authorized for detection and/or diagnosis of SARS-CoV-2 by FDA under an Emergency Use Authorization (EUA). This EUA will remain  in effect (meaning this test can be used) for the duration of the COVID-19 declaration under Section 56 4(b)(1) of the Act, 21 U.S.C. section 360bbb-3(b)(1), unless the authorization is terminated or revoked sooner. Performed at [facility], [place] [address], [place], [place] [address]       Radiology Studies: No results found.  Scheduled Meds: . folic acid  1 mg Oral Daily  . metoprolol succinate  50 mg Oral Daily  . multivitamin with minerals  1 tablet Oral Daily  . nicotine  21 mg Transdermal Daily  . thiamine  100 mg Oral Daily   Or  . thiamine  100 mg Intravenous Daily   Continuous Infusions: . sodium chloride 100 mL/hr at 01/15/20 0700  . heparin 1,150 Units/hr (01/15/20 0300)  . magnesium sulfate bolus IVPB 4 g (01/15/20 1025)     LOS: 3 days   Time spent: 28 minutes   Darliss Cheney, MD [facility]  01/15/2020, 11:34 AM   To contact the attending provider between 7A-7P or the covering provider during after hours 7P-7A, please log into the web site [email or web address].

## 2020-01-15 NOTE — Progress Notes (Signed)
[place] for heparin Indication: hx recurrent PE  Allergies  Allergen Reactions  . Other Other (See Comments)    "Cactus" caused blisters on tongue (if prepared as food)    Patient Measurements: Height: 5\' 9"  (175.3 cm) Weight: 199 lb (90.3 kg) IBW/kg (Calculated) : 70.7 Heparin Dosing Weight: 90 kg  Vital Signs: Temp: 97.9 F (36.6 C) (03/09 0720) Temp Source: Oral (03/09 0720) BP: 136/90 (03/09 0720) Pulse Rate: 80 (03/09 0720)  Labs: Recent Labs    01/13/20 0436 01/13/20 0436 01/13/20 1205 01/13/20 1814 01/14/20 0213 01/15/20 0416 01/15/20 0856  HGB 11.1*   < >  --   --  10.6* 10.2*  --   HCT 34.3*  --   --   --  33.2* 30.8*  --   PLT 178  --   --   --  166 172  --   LABPROT 20.0*  --   --   --  16.4* 15.0  --   INR 1.7*  --   --   --  1.3* 1.2  --   HEPARINUNFRC 0.84*  --    < > 0.76* 0.67 0.47  --   CREATININE 0.94  --   --   --   --  0.85 0.80   < > = values in this interval not displayed.    Estimated Creatinine Clearance: 109 mL/min (by C-G formula based on SCr of 0.8 mg/dL).   Assessment: 74 yoM admitted for left foot infection at second toe s/p left great toe amputation/osteomyelitis. Pt was on warfarin PTA for hx of PE. Pt will be scheduled for surgery this week, warfarin will be held until then. Pharmacy consulted to dose heparin.  PTA dose: 2.5 mg PO daily Last dose of warfarin taken on 3/4.  Heparin level is 0.47, remains therapeutic on IV heparin rate at 1150 units/hr. No bleeding noted. PLTC wnl,  H/H stable.  Goal of Therapy:  Heparin level 0.3-0.7 units/ml Monitor platelets by anticoagulation protocol: Yes   Plan:  Continue heparin infusion 1150 units/hr  Daily heparin level, CBC, s/sx of bleeding Daily INR F/u surgical plans- ? tomorrow  Thank you for allowing pharmacy to be part of this patients care team. Nicole Cella, RPh Clinical Pharmacist 01/15/2020 11:13 AM

## 2020-01-15 NOTE — Progress Notes (Signed)
Patient alert awake and comfortable  Discussed surgery tomorrow with Dr [name], specifically a left foot 1st metatarsal head resection and second toe amputation. Benefits risks and recovery discussed. Patient wishes to move forward

## 2020-01-15 NOTE — Plan of Care (Signed)
  Problem: Education: Goal: Knowledge of General Education information will improve Description: Including pain rating scale, medication(s)/side effects and non-pharmacologic comfort measures Outcome: Progressing   Problem: Health Behavior/Discharge Planning: Goal: Ability to manage health-related needs will improve Outcome: Progressing   Problem: Clinical Measurements: Goal: Will remain free from infection Outcome: Progressing   Problem: Coping: Goal: Level of anxiety will decrease Outcome: Progressing   Problem: Pain Managment: Goal: General experience of comfort will improve Outcome: Progressing   Problem: Safety: Goal: Ability to remain free from injury will improve Outcome: Progressing   Problem: Skin Integrity: Goal: Risk for impaired skin integrity will decrease Outcome: Progressing   

## 2020-01-15 NOTE — Plan of Care (Signed)
  Problem: Skin Integrity: Goal: Risk for impaired skin integrity will decrease Outcome: Progressing   Problem: Safety: Goal: Ability to remain free from injury will improve Outcome: Progressing   Problem: Pain Managment: Goal: General experience of comfort will improve Outcome: Progressing   Problem: Coping: Goal: Level of anxiety will decrease Outcome: Progressing   Problem: Clinical Measurements: Goal: Will remain free from infection Outcome: Progressing   Problem: Education: Goal: Knowledge of General Education information will improve Description: Including pain rating scale, medication(s)/side effects and non-pharmacologic comfort measures Outcome: Progressing

## 2020-01-16 ENCOUNTER — Encounter (HOSPITAL_COMMUNITY): Payer: Self-pay | Admitting: Internal Medicine

## 2020-01-16 ENCOUNTER — Encounter (HOSPITAL_COMMUNITY)
Admission: EM | Disposition: A | Discharge status: Home Health | Payer: Self-pay | Source: Home / Self Care | Attending: Family Medicine

## 2020-01-16 ENCOUNTER — Inpatient Hospital Stay (HOSPITAL_COMMUNITY): Payer: Medicaid Other | Admitting: Anesthesiology

## 2020-01-16 HISTORY — PX: AMPUTATION: SHX166

## 2020-01-16 LAB — BASIC METABOLIC PANEL
Anion gap: 7 (ref 5–15)
BUN: 7 mg/dL (ref 6–20)
CO2: 23 mmol/L (ref 22–32)
Calcium: 8.5 mg/dL — ABNORMAL LOW (ref 8.9–10.3)
Chloride: 107 mmol/L (ref 98–111)
Creatinine, Ser: 0.83 mg/dL (ref 0.61–1.24)
GFR calc Af Amer: >60 mL/min (ref >60)
GFR calc non Af Amer: >60 mL/min (ref >60)
Glucose, Bld: 108 mg/dL — ABNORMAL HIGH (ref 70–99)
Potassium: 3.6 mmol/L (ref 3.5–5.1)
Sodium: 137 mmol/L (ref 135–145)

## 2020-01-16 LAB — CULTURE, BLOOD (ROUTINE X 2): Culture: NO GROWTH

## 2020-01-16 LAB — PROTIME-INR
INR: 1.2 (ref 0.8–1.2)
Prothrombin Time: 14.9 seconds (ref 11.4–15.2)

## 2020-01-16 LAB — HEPARIN LEVEL (UNFRACTIONATED): Heparin Unfractionated: 0.28 IU/mL — ABNORMAL LOW (ref 0.30–0.70)

## 2020-01-16 LAB — MAGNESIUM: Magnesium: 1.7 mg/dL (ref 1.7–2.4)

## 2020-01-16 LAB — CBC
HCT: 31.6 % — ABNORMAL LOW (ref 39.0–52.0)
Hemoglobin: 10.5 g/dL — ABNORMAL LOW (ref 13.0–17.0)
MCH: 27.5 pg (ref 26.0–34.0)
MCHC: 33.2 g/dL (ref 30.0–36.0)
MCV: 82.7 fL (ref 80.0–100.0)
Platelets: 169 10*3/uL (ref 150–400)
RBC: 3.82 MIL/uL — ABNORMAL LOW (ref 4.22–5.81)
RDW: 16.2 % — ABNORMAL HIGH (ref 11.5–15.5)
WBC: 2.5 10*3/uL — ABNORMAL LOW (ref 4.0–10.5)
nRBC: 0.8 % — ABNORMAL HIGH (ref 0.0–0.2)

## 2020-01-16 SURGERY — AMPUTATION, FOOT, RAY
Anesthesia: General | Site: Foot | Laterality: Left

## 2020-01-16 MED ORDER — 0.9 % SODIUM CHLORIDE (POUR BTL) OPTIME
TOPICAL | Status: DC | PRN
  Administered 2020-01-16: 1000 mL

## 2020-01-16 MED ORDER — HYDROMORPHONE HCL 1 MG/ML IJ SOLN
0.2500 mg | INTRAMUSCULAR | Status: DC | PRN
  Administered 2020-01-16: 14:00:00 0.5 mg via INTRAVENOUS

## 2020-01-16 MED ORDER — PROPOFOL 10 MG/ML IV BOLUS
INTRAVENOUS | Status: DC | PRN
  Administered 2020-01-16: 150 mg via INTRAVENOUS

## 2020-01-16 MED ORDER — CEFAZOLIN SODIUM-DEXTROSE 2-4 GM/100ML-% IV SOLN
2.0000 g | Freq: Four times a day (QID) | INTRAVENOUS | Status: AC
  Administered 2020-01-16 – 2020-01-17 (×3): 2 g via INTRAVENOUS
  Filled 2020-01-16 (×3): qty 100

## 2020-01-16 MED ORDER — OXYCODONE HCL 5 MG PO TABS: 5.0000 mg | ORAL_TABLET | Freq: Once | ORAL | Status: DC | PRN

## 2020-01-16 MED ORDER — DOCUSATE SODIUM 100 MG PO CAPS
100.0000 mg | ORAL_CAPSULE | Freq: Two times a day (BID) | ORAL | Status: DC
  Administered 2020-01-16 – 2020-01-18 (×5): 100 mg via ORAL
  Filled 2020-01-16 (×5): qty 1

## 2020-01-16 MED ORDER — MIDAZOLAM HCL 2 MG/2ML IJ SOLN
INTRAMUSCULAR | Status: AC
  Filled 2020-01-16: qty 2

## 2020-01-16 MED ORDER — ONDANSETRON HCL 4 MG/2ML IJ SOLN: 4.0000 mg | Freq: Once | INTRAMUSCULAR | Status: DC | PRN

## 2020-01-16 MED ORDER — OXYCODONE HCL 5 MG/5ML PO SOLN: 5.0000 mg | Freq: Once | ORAL | Status: DC | PRN

## 2020-01-16 MED ORDER — LIDOCAINE 2% (20 MG/ML) 5 ML SYRINGE
INTRAMUSCULAR | Status: DC | PRN
  Administered 2020-01-16: 100 mg via INTRAVENOUS

## 2020-01-16 MED ORDER — FENTANYL CITRATE (PF) 250 MCG/5ML IJ SOLN
INTRAMUSCULAR | Status: DC | PRN
  Administered 2020-01-16: 50 ug via INTRAVENOUS

## 2020-01-16 MED ORDER — ONDANSETRON HCL 4 MG/2ML IJ SOLN
INTRAMUSCULAR | Status: DC | PRN
  Administered 2020-01-16: 4 mg via INTRAVENOUS

## 2020-01-16 MED ORDER — HYDROMORPHONE HCL 1 MG/ML IJ SOLN
INTRAMUSCULAR | Status: AC
  Filled 2020-01-16: qty 1

## 2020-01-16 MED ORDER — MEPERIDINE HCL 25 MG/ML IJ SOLN: 6.2500 mg | INTRAMUSCULAR | Status: DC | PRN

## 2020-01-16 MED ORDER — SODIUM CHLORIDE 0.9 % IV SOLN: INTRAVENOUS | Status: DC

## 2020-01-16 MED ORDER — DEXAMETHASONE SODIUM PHOSPHATE 10 MG/ML IJ SOLN
INTRAMUSCULAR | Status: DC | PRN
  Administered 2020-01-16: 4 mg via INTRAVENOUS

## 2020-01-16 MED ORDER — CEFAZOLIN SODIUM-DEXTROSE 2-4 GM/100ML-% IV SOLN
2.0000 g | INTRAVENOUS | Status: AC
  Administered 2020-01-16: 2 g via INTRAVENOUS
  Filled 2020-01-16: qty 100

## 2020-01-16 MED ORDER — FENTANYL CITRATE (PF) 250 MCG/5ML IJ SOLN
INTRAMUSCULAR | Status: AC
  Filled 2020-01-16: qty 5

## 2020-01-16 MED ORDER — LACTATED RINGERS IV SOLN: INTRAVENOUS | Status: DC | PRN

## 2020-01-16 MED ORDER — MIDAZOLAM HCL 2 MG/2ML IJ SOLN
INTRAMUSCULAR | Status: DC | PRN
  Administered 2020-01-16: 2 mg via INTRAVENOUS

## 2020-01-16 SURGICAL SUPPLY — 31 items
BLADE SAW SGTL MED 73X18.5 STR (BLADE) ×1 IMPLANT
BLADE SURG 21 STRL SS (BLADE) ×2 IMPLANT
BNDG COHESIVE 4X5 TAN STRL (GAUZE/BANDAGES/DRESSINGS) ×2 IMPLANT
BNDG GAUZE ELAST 4 BULKY (GAUZE/BANDAGES/DRESSINGS) ×2 IMPLANT
COVER SURGICAL LIGHT HANDLE (MISCELLANEOUS) ×4 IMPLANT
COVER WAND RF STERILE (DRAPES) ×2 IMPLANT
DRAPE INCISE IOBAN 66X45 STRL (DRAPES) ×1 IMPLANT
DRAPE U-SHAPE 47X51 STRL (DRAPES) ×4 IMPLANT
DRSG ADAPTIC 3X8 NADH LF (GAUZE/BANDAGES/DRESSINGS) ×2 IMPLANT
DRSG PAD ABDOMINAL 8X10 ST (GAUZE/BANDAGES/DRESSINGS) ×4 IMPLANT
DURAPREP 26ML APPLICATOR (WOUND CARE) ×2 IMPLANT
ELECT REM PT RETURN 9FT ADLT (ELECTROSURGICAL) ×2
ELECTRODE REM PT RTRN 9FT ADLT (ELECTROSURGICAL) ×1 IMPLANT
GAUZE SPONGE 4X4 12PLY STRL (GAUZE/BANDAGES/DRESSINGS) ×2 IMPLANT
GLOVE BIOGEL PI IND STRL 9 (GLOVE) ×1 IMPLANT
GLOVE BIOGEL PI INDICATOR 9 (GLOVE) ×1
GLOVE SURG ORTHO 9.0 STRL STRW (GLOVE) ×2 IMPLANT
GOWN STRL REUS W/ TWL XL LVL3 (GOWN DISPOSABLE) ×2 IMPLANT
GOWN STRL REUS W/TWL XL LVL3 (GOWN DISPOSABLE) ×4
KIT BASIN OR (CUSTOM PROCEDURE TRAY) ×2 IMPLANT
KIT DRSG PREVENA PLUS 7DAY 125 (MISCELLANEOUS) ×1 IMPLANT
KIT PREVENA INCISION MGT 13 (CANNISTER) ×1 IMPLANT
KIT TURNOVER KIT B (KITS) ×2 IMPLANT
NS IRRIG 1000ML POUR BTL (IV SOLUTION) ×2 IMPLANT
PACK ORTHO EXTREMITY (CUSTOM PROCEDURE TRAY) ×2 IMPLANT
PAD ARMBOARD 7.5X6 YLW CONV (MISCELLANEOUS) ×4 IMPLANT
STOCKINETTE IMPERVIOUS LG (DRAPES) IMPLANT
SUT ETHILON 2 0 PSLX (SUTURE) ×3 IMPLANT
TOWEL GREEN STERILE (TOWEL DISPOSABLE) ×2 IMPLANT
TUBE CONNECTING 12X1/4 (SUCTIONS) ×2 IMPLANT
YANKAUER SUCT BULB TIP NO VENT (SUCTIONS) ×2 IMPLANT

## 2020-01-16 NOTE — Transfer of Care (Signed)
Immediate Anesthesia Transfer of Care Note  Patient: Leonard Bentley  Procedure(s) Performed: LEFT 1ST METATARSAL AND SECOND TOE AMPUTATION (Left Foot)  Patient Location: PACU  Anesthesia Type:General  Level of Consciousness: awake  Airway & Oxygen Therapy: Patient Spontanous Breathing  Post-op Assessment: Report given to RN  Post vital signs: Reviewed and stable  Last Vitals:  Vitals Value Taken Time  BP    Temp    Pulse 99 01/16/20 1343  Resp    SpO2 99 % 01/16/20 1343  Vitals shown include unvalidated device data.  Last Pain:  Vitals:   01/16/20 1144  TempSrc:   PainSc: 5       Patients Stated Pain Goal: 2 ([id number])  Complications: No apparent anesthesia complications

## 2020-01-16 NOTE — Anesthesia Preprocedure Evaluation (Signed)
Anesthesia Evaluation  Patient identified by MRN, date of birth, ID band Patient awake    Reviewed: Allergy & Precautions, NPO status , Patient's Chart, lab work & pertinent test results  Airway Mallampati: I  TM Distance: >3 FB Neck ROM: Full    Dental   Pulmonary Current Smoker,    Pulmonary exam normal        Cardiovascular hypertension, Pt. on medications Normal cardiovascular exam+ Valvular Problems/Murmurs MR      Neuro/Psych Anxiety Depression    GI/Hepatic (+) Hepatitis -  Endo/Other    Renal/GU      Musculoskeletal   Abdominal   Peds  Hematology   Anesthesia Other Findings   Reproductive/Obstetrics                             Anesthesia Physical Anesthesia Plan  ASA: III  Anesthesia Plan: General   Post-op Pain Management:    Induction: Intravenous  PONV Risk Score and Plan: 1  Airway Management Planned: LMA  Additional Equipment:   Intra-op Plan:   Post-operative Plan: Extubation in OR  Informed Consent: I have reviewed the patients History and Physical, chart, labs and discussed the procedure including the risks, benefits and alternatives for the proposed anesthesia with the patient or authorized representative who has indicated his/her understanding and acceptance.       Plan Discussed with: CRNA and Surgeon  Anesthesia Plan Comments:         Anesthesia Quick Evaluation

## 2020-01-16 NOTE — Progress Notes (Signed)
Orthopedic Tech Progress Note Patient Details:  Zahkai Mershon [birth year]al Nov 22, 1960 [id number] Applied by [employer] Type of Ortho Device: Postop shoe/boot Ortho Device/Splint Location: LLE Ortho Device/Splint Interventions: Ordered, Application   Post Interventions Patient Tolerated: Well Instructions Provided: Care of device, Adjustment of device   Janit Pagan 01/16/2020, 3:58 PM

## 2020-01-16 NOTE — Anesthesia Procedure Notes (Signed)
Procedure Name: LMA Insertion Date/Time: 01/16/2020 1:12 PM Performed by: Bryson Corona, CRNA Pre-anesthesia Checklist: Patient identified, Emergency Drugs available, Suction available and Patient being monitored Patient Re-evaluated:Patient Re-evaluated prior to induction Oxygen Delivery Method: Circle System Utilized Preoxygenation: Pre-oxygenation with 100% oxygen Induction Type: IV induction Ventilation: Mask ventilation without difficulty LMA: LMA inserted LMA Size: 5.0 Number of attempts: 1 Airway Equipment and Method: Bite block Placement Confirmation: positive ETCO2 Tube secured with: Tape Dental Injury: Teeth and Oropharynx as per pre-operative assessment

## 2020-01-16 NOTE — Progress Notes (Addendum)
Spoke with Pharmacy and they recommended heparin drip to be discontinued 6 hours before surgery.  Spoke with Dr [name] and he said that is okay. Surgery scheduled at 1:30 pm.   Heparin drip stopped at 7:20 am.

## 2020-01-16 NOTE — Progress Notes (Signed)
PROGRESS NOTE    Leonard Bentley  [id number] DOB: 09-Apr-1961 DOA: 01/11/2020 PCP: [name], NP   Brief Narrative:   59 y.o. male with medical history significant for severe mitral regurgitation s/p mitral valve repair and recurrent PE on Coumadin, history of SVT/PAT/PACs, chronic combined systolic and diastolic CHF (EF improved to 55-60%), history of stage II non-Hodgkin's lymphoma s/p chemotherapy with CHOP/Rituxan on observation, left first toe osteomyelitis s/p left great toe amputation, depression/anxiety, alcohol use 1 bottle a [place], and continued 1 pack/[place] tobacco use who presents to the ED for evaluation of left foot infection.  Patient was admitted 3/5 progressive left foot pain left second toe drainage and antalgia    Assessment & Plan:   Principal Problem:   Left foot infection Active Problems:   History of pulmonary embolus (PE)   S/P minimally-invasive mitral valve repair   Chronic combined systolic and diastolic heart failure (HCC)   Cellulitis of left foot  Left foot infection at second toe/s/p left great toe amputation/left toe osteomyelitis: CT foot confirms left toe osteomyelitis.  Consulted orthopedics.  Seen by Dr. [name].  Antibiotics on hold Going for surgery 3/10  Severe mitral regurgitation s/p mitral valve repair and history of PE: Patient takes Coumadin at home which is on hold due to anticipated surgery.-- Resume anticoagulation as per orthopedics  History of SVT/PAT/PACs: Controlled.  Continue Toprol-XL 50 daily  Hypomagnesemia: Magnesium 1.7 up from 1.3  Chronic combined systolic and diastolic CHF: EF improved to 55-60%, he is no longer requiring diuretic therapy.  He is overall volume depleted.  Cut back rate to 50 cc an hour as volume depletion has improved  Alcohol use: Reports drinking at least 1 bottle of wine nightly with last drink 12 hours prior to admission.  Has anxiety and obvious gross tremors in upper extremities--CIWA rated  from 2-3  History of stage II non-Hodgkin's lymphoma: S/p chemotherapy with CHOP/Rituxan currently in observation.  Follows with oncology, Dr. Lorna Few. His blood counts are overall low we will get a differential to ensure that his ANC is appropriate in the morning  Tobacco use: Smokes 1 pack/[place].  Advised on cutting back and eventual cessation.  Nicotine patch   Impaired glucose tolerance   DVT prophylaxis: Heparin drip   Code Status: Full Code  Family Communication:  None present at bedside.  Plan of care discussed with patient in length and he verbalized understanding and agreed with it. Patient is from: Home Disposition Plan: To be determined based on clinical course Barriers to discharge: Surgical procedure pending  Consultants:   Orthopedics  Procedures:   None  Antimicrobials:   None   Subjective:  Patient standing in bathroom about to brush teeth but seems quite tremulous About to go for surgery therefore foot not examined  Objective: Vitals:   01/15/20 2230 01/16/20 0428 01/16/20 0733 01/16/20 1144  BP: (!) 129/92 121/87 (!) 145/75   Pulse: 83 66 65   Resp:  16 16   Temp: 98.4 F (36.9 C) 98.6 F (37 C) 98.3 F (36.8 C)   TempSrc: Oral Oral Oral   SpO2: 100% 100% 98%   Weight:    90 kg  Height:    5' 9.5" (1.765 m)    Intake/Output Summary (Last 24 hours) at 01/16/2020 1228 Last data filed at 01/16/2020 0500 Gross per 24 hour  Intake 768.16 ml  Output 1400 ml  Net -631.84 ml   Filed Weights   01/11/20 1931 01/16/20 1144  Weight:  90.3 kg 90 kg    Examination:  EOMI NCAT no focal deficit Chest clear no added sound no rales Abdomen soft no rebound no guarding Neurologically intact seems appropriate and coherent Power 5/5 although gait is somewhat antalgic   Data Reviewed: I have personally reviewed following labs and imaging studies  CBC: Recent Labs  Lab 01/11/20 2230 01/11/20 2230 01/12/20 0208 01/13/20 0436  01/14/20 0213 01/15/20 0416 01/16/20 0445  WBC 4.3   < > 4.1 3.5* 2.8* 2.9* 2.5*  NEUTROABS 3.4  --   --   --   --   --   --   HGB 13.6   < > 12.8* 11.1* 10.6* 10.2* 10.5*  HCT 41.0   < > 38.1* 34.3* 33.2* 30.8* 31.6*  MCV 83.0   < > 81.6 85.1 85.3 83.0 82.7  PLT 209   < > 185 178 166 172 169   < > = values in this interval not displayed.   Basic Metabolic Panel: Recent Labs  Lab 01/12/20 0208 01/12/20 0208 01/13/20 0436 01/14/20 1202 01/15/20 0416 01/15/20 0856 01/16/20 0445  NA 136  --  138  --  135 136 137  K 4.6   < > 4.3 4.1 3.8 4.0 3.6  CL 101  --  103  --  104 107 107  CO2 23  --  24  --  21* 21* 23  GLUCOSE 102*  --  99  --  105* 103* 108*  BUN 5*  --  12  --  10 8 7   CREATININE 0.90  --  0.94  --  0.85 0.80 0.83  CALCIUM 8.5*  --  8.4*  --  8.7* 8.5* 8.5*  MG 1.4*  --   --  1.5* 1.3*  --  1.7  PHOS 3.8  --   --   --  3.3  --   --    < > = values in this interval not displayed.   GFR: Estimated Creatinine Clearance: 105.9 mL/min (by C-G formula based on SCr of 0.83 mg/dL). Liver Function Tests: Recent Labs  Lab 01/11/20 2230 01/12/20 0208  AST 105* 92*  ALT 79* 73*  ALKPHOS 57 55  BILITOT 0.8 1.3*  PROT 6.6 6.4*  ALBUMIN 3.4* 3.3*   No results for input(s): LIPASE, AMYLASE in the last 168 hours. No results for input(s): AMMONIA in the last 168 hours. Coagulation Profile: Recent Labs  Lab 01/12/20 0208 01/13/20 0436 01/14/20 0213 01/15/20 0416 01/16/20 0445  INR 2.3* 1.7* 1.3* 1.2 1.2   Cardiac Enzymes: No results for input(s): CKTOTAL, CKMB, CKMBINDEX, TROPONINI in the last 168 hours. BNP (last 3 results) No results for input(s): PROBNP in the last 8760 hours. HbA1C: No results for input(s): HGBA1C in the last 72 hours. CBG: No results for input(s): GLUCAP in the last 168 hours. Lipid Profile: No results for input(s): CHOL, HDL, LDLCALC, TRIG, CHOLHDL, LDLDIRECT in the last 72 hours. Thyroid Function Tests: No results for input(s): TSH,  T4TOTAL, FREET4, T3FREE, THYROIDAB in the last 72 hours. Anemia Panel: No results for input(s): VITAMINB12, FOLATE, FERRITIN, TIBC, IRON, RETICCTPCT in the last 72 hours. Sepsis Labs: Recent Labs  Lab 01/11/20 2230 01/12/20 0208 01/12/20 1458  LATICACIDVEN 3.3* 2.1* 2.0*    Recent Results (from the past 240 hour(s))  Blood culture (routine x 2)     Status: None   Collection Time: 01/11/20  8:39 PM   Specimen: BLOOD  Result Value Ref Range Status   Specimen  Description BLOOD SITE NOT SPECIFIED  Final   Special Requests   Final    BOTTLES DRAWN AEROBIC AND ANAEROBIC Blood Culture results may not be optimal due to an inadequate volume of blood received in culture bottles   Culture   Final    NO GROWTH 5 DAYS Performed at [facility], [place] [address], [place], [place] [address]    Report Status 01/16/2020 FINAL  Final  Blood culture (routine x 2)     Status: None (Preliminary result)   Collection Time: 01/11/20 10:30 PM   Specimen: BLOOD  Result Value Ref Range Status   Specimen Description BLOOD SITE NOT SPECIFIED  Final   Special Requests AEROBIC BOTTLE ONLY Blood Culture adequate volume  Final   Culture   Final    NO GROWTH 4 DAYS Performed at [facility], [place] [address], [place], [place] [address]    Report Status PENDING  Incomplete  SARS CORONAVIRUS 2 (TAT 6-24 HRS) Nasopharyngeal Nasopharyngeal Swab     Status: None   Collection Time: 01/12/20 12:47 AM   Specimen: Nasopharyngeal Swab  Result Value Ref Range Status   SARS Coronavirus 2 NEGATIVE NEGATIVE Final    Comment: (NOTE) SARS-CoV-2 target nucleic acids are NOT DETECTED. The SARS-CoV-2 RNA is generally detectable in upper and lower respiratory specimens during the acute phase of infection. Negative results do not preclude SARS-CoV-2 infection, do not rule out co-infections with other pathogens, and should not be used as the sole basis for treatment or other patient management decisions. Negative  results must be combined with clinical observations, patient history, and epidemiological information. The expected result is Negative. Fact Sheet for Patients: SugarRoll.be Fact Sheet for Healthcare Providers: https://www.woods-mathews.com/ This test is not yet approved or cleared by the [place] FDA and  has been authorized for detection and/or diagnosis of SARS-CoV-2 by FDA under an Emergency Use Authorization (EUA). This EUA will remain  in effect (meaning this test can be used) for the duration of the COVID-19 declaration under Section 56 4(b)(1) of the Act, 21 U.S.C. section 360bbb-3(b)(1), unless the authorization is terminated or revoked sooner. Performed at [facility], [place] [address] [place], [place] [address]   Surgical pcr screen     Status: None   Collection Time: 01/15/20 11:50 AM   Specimen: Nasal Mucosa; Nasal Swab  Result Value Ref Range Status   MRSA, PCR NEGATIVE NEGATIVE Final   Staphylococcus aureus NEGATIVE NEGATIVE Final    Comment: (NOTE) The Xpert SA Assay (FDA approved for NASAL specimens in patients 11 years of age and older), is one component of a comprehensive surveillance program. It is not intended to diagnose infection nor to guide or monitor treatment. Performed at [facility], [place] [address] [place],  [address]       Radiology Studies: No results found.  Scheduled Meds: . chlorhexidine  60 mL Topical Once  . [MAR Hold] folic acid  1 mg Oral Daily  . [MAR Hold] metoprolol succinate  50 mg Oral Daily  . [MAR Hold] multivitamin with minerals  1 tablet Oral Daily  . [MAR Hold] nicotine  21 mg Transdermal Daily  . [MAR Hold] thiamine  100 mg Oral Daily   Or  . [MAR Hold] thiamine  100 mg Intravenous Daily   Continuous Infusions: . sodium chloride 100 mL/hr at 01/16/20 0008  .  ceFAZolin (ANCEF) IV    . heparin Stopped (01/16/20 0720)     LOS:  4 days   Time spent: 28  minutes   Nita Sells, MD [facility]  01/16/2020, 12:28 PM   To contact the attending provider between 7A-7P or the covering provider during after hours 7P-7A, please log into the web site [email or web address].

## 2020-01-16 NOTE — Anesthesia Postprocedure Evaluation (Signed)
Anesthesia Post Note  Patient: Leonard Bentley  Procedure(s) Performed: LEFT 1ST METATARSAL AND SECOND TOE AMPUTATION (Left Foot)     Patient location during evaluation: PACU Anesthesia Type: General Level of consciousness: awake and alert and oriented Pain management: pain level controlled Vital Signs Assessment: post-procedure vital signs reviewed and stable Respiratory status: spontaneous breathing, nonlabored ventilation and respiratory function stable Cardiovascular status: blood pressure returned to baseline and stable Postop Assessment: no apparent nausea or vomiting Anesthetic complications: no    Last Vitals:  Vitals:   01/16/20 1442 01/16/20 1639  BP: (!) 133/98 (!) 131/100  Pulse: 84 66  Resp:  18  Temp:    SpO2: 100% 99%    Last Pain:  Vitals:   01/16/20 1842  TempSrc:   PainSc: 6                  Natoya Viscomi A.

## 2020-01-16 NOTE — TOC Initial Note (Signed)
Transition of Care (TOC) - Initial/Assessment Note    Patient Details  Name: Leonard Bentley MRN: [id number] Date of Birth: 12/09/1960  Transition of Care [facility]) CM/SW Contact:    Curlene Labrum, RN Phone Number: 01/16/2020, 1:45 PM  Clinical Narrative:     59 y.o.malewith medical history significant forsevere mitral regurgitation s/p mitral valve repair and recurrent PE on Coumadin, history of SVT/PAT/PACs, chronic combined systolic and diastolic CHF (EF improved to 55-60%), history of stage II non-Hodgkin's lymphoma s/p chemotherapy with CHOP/Rituxan on observation, left first toe osteomyelitis s/p left great toe amputation, depression/anxiety, alcohol use 1 bottle a [place], and continued 1 pack/Crespi tobacco use   Case Management met with the patient this morning prior to surgery for amputation of a toe on the left foot.  Patient states that he currently smokes 1 pack/[place] and consumes 1 bottle of wine daily.  Patient given packet of information for outpatient alcohol and substance abuse counseling and treatment.  Patient states he is anxious to feel better and open to cessation of alcohol use.  The patient lives along and [place] is pending.  The patient's brother lives in [place], [place] nearby and can assist with patient's care.  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    Patient has medications filled through [facility] on [address] at [address].  The patient's PCP is Dr. Archie Patten at [facility]. No other barriers to discharge noted at this time.  Patient has no current DME but will possibly need a RW or crutches after his surgery today and prior to discharge home.  Expected Discharge Plan: [place] Barriers to Discharge: No Barriers Identified   Patient Goals and CMS Choice Patient states their goals for this hospitalization and ongoing recovery are:: I'm anxious about my  surgery today, but I'm ready to feel better.      Expected Discharge Plan and Services Expected Discharge Plan: [place]   Discharge Planning Services: CM Consult   Living arrangements for the past 2 months: Single Family Home                                      Prior Living Arrangements/Services Living arrangements for the past 2 months: Single Family Home Lives with:: Self Patient language and need for interpreter reviewed:: Yes Do you feel safe going back to the place where you live?: Yes      Need for Family Participation in Patient Care: Yes (Comment) Care giver support system in place?: Yes (comment)   Criminal Activity/Legal Involvement Pertinent to Current Situation/Hospitalization: No - Comment as needed  Activities of Daily Living Home Assistive Devices/Equipment: Cane (specify quad or straight) ADL Screening (condition at time of admission) Patient's cognitive ability adequate to safely complete daily activities?: Yes Is the patient deaf or have difficulty hearing?: No Does the patient have difficulty seeing, even when wearing glasses/contacts?: No Does the patient have difficulty concentrating, remembering, or making decisions?: No Patient able to express need for assistance with ADLs?: Yes Does the patient have difficulty dressing or bathing?: No Independently performs ADLs?: Yes (appropriate for developmental age) Does the patient have difficulty walking or climbing stairs?: No Weakness of Legs: None Weakness of Arms/Hands: None  Permission Sought/Granted Permission sought to share information with : Case Manager Permission granted to share  information with : Yes, Verbal Permission Granted           Permission granted to share info w Contact Information: Outpatient resources provided for alcohol /substance abuse.  Emotional Assessment Appearance:: Appears stated age Attitude/Demeanor/Rapport: Gracious Affect (typically  observed): Accepting Orientation: : Oriented to Self, Oriented to Place, Oriented to  Time, Oriented to Situation Alcohol / Substance Use: Alcohol Use Psych Involvement: No (comment)  Admission diagnosis:  Pain [R52] Cellulitis of left foot [L03.116] Left foot infection [L08.9] Patient Active Problem List   Diagnosis Date Noted  . Cellulitis of left foot   . Left foot infection 01/11/2020  . Chronic combined systolic and diastolic heart failure ([place]) 03/06/2019  . Coagulation disorder ([place]) 03/06/2019  . Impacted cerumen of both ears 02/09/2019  . Osteomyelitis of great toe of left foot ([place]) 01/09/2019  . Foot ulcer, left ([place]) 12/27/2018  . Cellulitis 12/07/2018  . Bleeding from left ear 11/28/2018  . HTN (hypertension) 11/02/2018  . UTI (urinary tract infection) 11/02/2018  . Ecchymosis   . Tachyarrhythmia 08/30/2018  . Hypomagnesemia 08/30/2018  . Recurrent pulmonary emboli ([place])   . Hematuria   . Delirium tremens ([place])   . LFTs abnormal   . Alcoholic hepatitis without ascites   . Alcohol withdrawal ([place]) 06/04/2018  . Dizziness   . Posterior tibial tendon dysfunction 02/21/2018  . Supratherapeutic INR 11/27/2016  . Dehydration 11/27/2016  . SVT (supraventricular tachycardia) ([place])   . S/P minimally-invasive mitral valve repair 10/27/2016  . History of pulmonary embolus (PE) 04/11/2016  . Left sided numbness   . Anxiety 04/02/2016  . Panic attacks 04/02/2016  . Mitral valve prolapse   . Mitral regurgitation 01/20/2016  . Atrial tachycardia ([place]) 01/06/2016  . Murmur 01/06/2016  . Mobitz I 10/09/2015  . PAC (premature atrial contraction) 10/09/2015  . Arthritis 08/14/2015  . Neuropathy due to chemotherapeutic drug ([place]) 03/09/2013  . Leucopenia 01/31/2013  . Personal history of pulmonary embolism 12/28/2012  . Alcohol abuse 08/24/2012  . Cigarette smoker 08/24/2012  . Hemorrhoid 03/08/2012  . Anemia 12/10/2011  . History of non-Hodgkin's lymphoma 09/25/2011    PCP:  [name], NP Pharmacy:   [facility] ([place]), [place] - [address] [place] ([place]) [place] [address] Phone: [phone] Fax: [phone]     Social Determinants of Health ([place]) Interventions    Readmission Risk Interventions Readmission Risk Prevention Plan 01/16/2020 01/18/2019  Transportation Screening Complete Complete  PCP or Specialist Appt within 5-7 Days Complete -  Home Care Screening Complete -  Medication Review (RN CM) Complete -  Medication Review ([place]) - Complete  HRI or [place] - Complete  [place] - Not Applicable  Some recent data might be hidden

## 2020-01-16 NOTE — Interval H&P Note (Signed)
History and Physical Interval Note:  01/16/2020 6:53 AM  Leonard Bentley  has presented today for surgery, with the diagnosis of Osteomyelitis Left Foot.  The various methods of treatment have been discussed with the patient and family. After consideration of risks, benefits and other options for treatment, the patient has consented to  Procedure(s): LEFT 1ST METATARSAL AND SECOND TOE AMPUTATION (Left) as a surgical intervention.  The patient's history has been reviewed, patient examined, no change in status, stable for surgery.  I have reviewed the patient's chart and labs.  Questions were answered to the patient's satisfaction.     [name]

## 2020-01-16 NOTE — Plan of Care (Addendum)
Pt restarted on heparin drip following surgery d/t order still being active. Called Dr. [name] once at 1615 and 1630, left voicemail to return call regarding whether or not to keep pt on heparin drip.    Called Persons, PA who stated to reach out to internal MD. Verlon Au, MD stated to restart heparin drip. Notified pharmacy at 1653. Will continue to monitor.   Problem: Education: Goal: Knowledge of General Education information will improve Description: Including pain rating scale, medication(s)/side effects and non-pharmacologic comfort measures Outcome: Progressing   Problem: Health Behavior/Discharge Planning: Goal: Ability to manage health-related needs will improve Outcome: Progressing   Problem: Clinical Measurements: Goal: Will remain free from infection Outcome: Progressing   Problem: Pain Managment: Goal: General experience of comfort will improve Outcome: Progressing   Problem: Safety: Goal: Ability to remain free from injury will improve Outcome: Progressing   Problem: Skin Integrity: Goal: Risk for impaired skin integrity will decrease Outcome: Progressing

## 2020-01-16 NOTE — Op Note (Signed)
01/16/2020  1:57 PM  PATIENT:  Leonard Bentley    PRE-OPERATIVE DIAGNOSIS:  Osteomyelitis Left Foot  POST-OPERATIVE DIAGNOSIS:  Same  PROCEDURE:  LEFT 1ST METATARSAL AND SECOND TOE AMPUTATION  SURGEON:  [name], MD  PHYSICIAN ASSISTANT:None ANESTHESIA:   General  PREOPERATIVE INDICATIONS:  [name] Avera is a  59 y.o. male with a diagnosis of Osteomyelitis Left Foot who failed conservative measures and elected for surgical management.    The risks benefits and alternatives were discussed with the patient preoperatively including but not limited to the risks of infection, bleeding, nerve injury, cardiopulmonary complications, the need for revision surgery, among others, and the patient was willing to proceed.  OPERATIVE IMPLANTS: Praveena wound VAC 13 cm  @ENCIMAGES @  OPERATIVE FINDINGS: Clean tissue margins no evidence of infection at the level of amputation  OPERATIVE PROCEDURE: Patient was brought the operating room and underwent a general anesthetic.  After adequate levels anesthesia were obtained patient's left lower extremity was prepped using DuraPrep draped into a sterile field a timeout was called.  Elliptical incision was made around the first metatarsal head and the ulcerative tissue over the first metatarsal head as well as around the ulcerative tissue of the second toe.  A oscillating saw was used to perform a ray amputation through the first and second metatarsals.  The wound was irrigated with normal saline electrocautery was used for hemostasis.  The second and third metatarsals were beveled to provide a gentle cascade.  The incision was closed using 2-0 nylon and a Prevena wound VAC was applied this was overwrapped with Covan.  Patient was extubated taken the PACU in stable condition.   DISCHARGE PLANNING:  Antibiotic duration: Continue antibiotics for 24 hours  Weightbearing: Touchdown weightbearing on the left  Pain medication: Opioid pathway  Dressing care/  Wound VAC: Continue wound VAC for 1 week  Ambulatory devices: Walker or crutches   Discharge to: Home  Follow-up: In the office 1 week post operative.

## 2020-01-17 ENCOUNTER — Encounter (HOSPITAL_COMMUNITY): Payer: Self-pay | Admitting: Internal Medicine

## 2020-01-17 LAB — BASIC METABOLIC PANEL
Anion gap: 10 (ref 5–15)
BUN: 8 mg/dL (ref 6–20)
CO2: 22 mmol/L (ref 22–32)
Calcium: 8.7 mg/dL — ABNORMAL LOW (ref 8.9–10.3)
Chloride: 102 mmol/L (ref 98–111)
Creatinine, Ser: 0.79 mg/dL (ref 0.61–1.24)
GFR calc Af Amer: >60 mL/min (ref >60)
GFR calc non Af Amer: >60 mL/min (ref >60)
Glucose, Bld: 148 mg/dL — ABNORMAL HIGH (ref 70–99)
Potassium: 4.1 mmol/L (ref 3.5–5.1)
Sodium: 134 mmol/L — ABNORMAL LOW (ref 135–145)

## 2020-01-17 LAB — CBC WITH DIFFERENTIAL/PLATELET
Abs Immature Granulocytes: 0.02 10*3/uL (ref 0.00–0.07)
Basophils Absolute: 0 10*3/uL (ref 0.0–0.1)
Basophils Relative: 0 %
Eosinophils Absolute: 0 10*3/uL (ref 0.0–0.5)
Eosinophils Relative: 0 %
HCT: 32.7 % — ABNORMAL LOW (ref 39.0–52.0)
Hemoglobin: 10.8 g/dL — ABNORMAL LOW (ref 13.0–17.0)
Immature Granulocytes: 0 %
Lymphocytes Relative: 6 %
Lymphs Abs: 0.3 10*3/uL — ABNORMAL LOW (ref 0.7–4.0)
MCH: 27.6 pg (ref 26.0–34.0)
MCHC: 33 g/dL (ref 30.0–36.0)
MCV: 83.4 fL (ref 80.0–100.0)
Monocytes Absolute: 0.4 10*3/uL (ref 0.1–1.0)
Monocytes Relative: 9 %
Neutro Abs: 3.9 10*3/uL (ref 1.7–7.7)
Neutrophils Relative %: 85 %
Platelets: 188 10*3/uL (ref 150–400)
RBC: 3.92 MIL/uL — ABNORMAL LOW (ref 4.22–5.81)
RDW: 16.2 % — ABNORMAL HIGH (ref 11.5–15.5)
WBC: 4.6 10*3/uL (ref 4.0–10.5)
nRBC: 0.4 % — ABNORMAL HIGH (ref 0.0–0.2)

## 2020-01-17 LAB — PROTIME-INR
INR: 1.1 (ref 0.8–1.2)
Prothrombin Time: 14.3 seconds (ref 11.4–15.2)

## 2020-01-17 LAB — HEPARIN LEVEL (UNFRACTIONATED)
Heparin Unfractionated: 0.33 IU/mL (ref 0.30–0.70)
Heparin Unfractionated: 0.37 IU/mL (ref 0.30–0.70)

## 2020-01-17 LAB — CULTURE, BLOOD (ROUTINE X 2)
Culture: NO GROWTH
Special Requests: ADEQUATE

## 2020-01-17 MED ORDER — WARFARIN SODIUM 5 MG PO TABS: 5.0000 mg | ORAL_TABLET | Freq: Every day | ORAL | 0 refills | Status: DC

## 2020-01-17 MED ORDER — ENOXAPARIN SODIUM 150 MG/ML ~~LOC~~ SOLN: 150.0000 mg | Freq: Two times a day (BID) | SUBCUTANEOUS | 0 refills | Status: DC

## 2020-01-17 MED ORDER — METOPROLOL SUCCINATE ER 50 MG PO TB24: 50.0000 mg | ORAL_TABLET | Freq: Every day | ORAL | 0 refills | Status: DC

## 2020-01-17 MED ORDER — ENOXAPARIN SODIUM 150 MG/ML ~~LOC~~ SOLN
150.0000 mg | SUBCUTANEOUS | Status: DC
  Administered 2020-01-17 – 2020-01-18 (×2): 150 mg via SUBCUTANEOUS
  Filled 2020-01-17 (×2): qty 1

## 2020-01-17 MED ORDER — SODIUM CHLORIDE 0.9 % IV SOLN: 1.5000 mg/kg | Freq: Every day | SUBCUTANEOUS | 0 refills | Status: DC

## 2020-01-17 MED ORDER — NICOTINE 21 MG/24HR TD PT24: 21.0000 mg | MEDICATED_PATCH | Freq: Every day | TRANSDERMAL | 0 refills | Status: DC

## 2020-01-17 MED ORDER — WARFARIN SODIUM 5 MG PO TABS: 5.0000 mg | ORAL_TABLET | Freq: Once | ORAL | Status: DC

## 2020-01-17 MED ORDER — ENOXAPARIN (LOVENOX) PATIENT EDUCATION KIT: 1.0000 | PACK | Freq: Once | Status: AC

## 2020-01-17 MED ORDER — WARFARIN SODIUM 5 MG PO TABS
5.0000 mg | ORAL_TABLET | ORAL | Status: AC
  Administered 2020-01-17: 5 mg via ORAL
  Filled 2020-01-17: qty 1

## 2020-01-17 MED ORDER — OXYCODONE HCL 5 MG PO TABS: 5.0000 mg | ORAL_TABLET | ORAL | 0 refills | Status: DC | PRN

## 2020-01-17 MED ORDER — ACETAMINOPHEN 325 MG PO TABS: 650.0000 mg | ORAL_TABLET | Freq: Four times a day (QID) | ORAL | Status: DC | PRN

## 2020-01-17 MED ORDER — ENOXAPARIN SODIUM 300 MG/3ML IJ SOLN: 1.5000 mg/kg | INTRAMUSCULAR | 7 refills | Status: DC

## 2020-01-17 MED ORDER — WARFARIN - PHYSICIAN DOSING INPATIENT: Freq: Every day | Status: DC

## 2020-01-17 MED ORDER — ENOXAPARIN SODIUM 300 MG/3ML IJ SOLN
1.5000 mg/kg | INTRAMUSCULAR | Status: DC
  Filled 2020-01-17: qty 1.55

## 2020-01-17 MED ORDER — WARFARIN SODIUM 5 MG PO TABS: ORAL_TABLET | ORAL | 0 refills | Status: DC

## 2020-01-17 MED ORDER — ENOXAPARIN SODIUM 150 MG/ML ~~LOC~~ SOLN: 150.0000 mg | SUBCUTANEOUS | 0 refills | Status: DC

## 2020-01-17 MED FILL — OXYCODONE HCL 5 MG TABS: 5 | 4 days supply | Qty: 24 | Fill #0

## 2020-01-17 MED FILL — ENOXAPARIN 150 MG/ML SYR: 150 | 7 days supply | Qty: 7 | Fill #0

## 2020-01-17 MED FILL — METOPROLOL SUCCINATE ER 50: 50 | 30 days supply | Qty: 30 | Fill #0

## 2020-01-17 MED FILL — WARFARIN SODIUM 5 MG TABLET: 5 | 30 days supply | Qty: 30 | Fill #0

## 2020-01-17 NOTE — Progress Notes (Signed)
Patient ID: Leonard Bentley, male   DOB: [birth year], 60 y.o.   MRN: [id number] Patient is postoperative Leonard Bentley nine 1st and 2nd ray amputation left foot. Dressing is clean and dry the wound VAC canister has no drainage.  Patient may discharge to home will not need further antibiotics discussed the importance of minimizing weightbearing he will discharge with the Praveena wound VAC pump and I will follow-up in the office in 1 week.

## 2020-01-17 NOTE — Plan of Care (Signed)

## 2020-01-17 NOTE — Progress Notes (Signed)
[facility] for Heparin Indication: hx recurrent PE  Allergies  Allergen Reactions  . Other Other (See Comments)    "Cactus" caused blisters on tongue (if prepared as food)    Patient Measurements: Height: 5' 9.5" (176.5 cm) Weight: 198 lb 6.6 oz (90 kg) IBW/kg (Calculated) : 71.85 Heparin Dosing Weight: 90 kg  Vital Signs: Temp: 97.7 F (36.5 C) (03/10 2346) Temp Source: Oral (03/10 2346) BP: 121/110 (03/10 2346) Pulse Rate: 92 (03/10 2346)  Labs: Recent Labs    01/14/20 0213 01/14/20 0213 01/15/20 0416 01/15/20 0856 01/16/20 0445 01/16/20 2349  HGB 10.6*   < > 10.2*  --  10.5*  --   HCT 33.2*  --  30.8*  --  31.6*  --   PLT 166  --  172  --  169  --   LABPROT 16.4*  --  15.0  --  14.9  --   INR 1.3*  --  1.2  --  1.2  --   HEPARINUNFRC 0.67   < > 0.47  --  0.28* 0.33  CREATININE  --   --  0.85 0.80 0.83  --    < > = values in this interval not displayed.    Estimated Creatinine Clearance: 105.9 mL/min (by C-G formula based on SCr of 0.83 mg/dL).   Assessment: 71 yoM admitted for left foot infection at second toe s/p left great toe amputation/osteomyelitis. Pt was on warfarin PTA for hx of PE. Pt will be scheduled for surgery this week, warfarin will be held until then. Pharmacy consulted to dose heparin.  PTA dose: 2.5 mg PO daily Last dose of warfarin taken on 3/4.  Heparin level is 0.47, remains therapeutic on IV heparin rate at 1150 units/hr. No bleeding noted. PLTC wnl,  H/H stable.  3/11 AM update:  Heparin level therapeutic x 1 after re-start post-op  Goal of Therapy:  Heparin level 0.3-0.7 units/ml Monitor platelets by anticoagulation protocol: Yes   Plan:  Continue heparin infusion 1150 units/hr  Confirmatory heparin level with AM labs  Narda Bonds, PharmD, [facility] Pharmacist Phone: [phone]

## 2020-01-17 NOTE — Evaluation (Addendum)
Occupational Therapy Evaluation Patient Details Name: Leonard Bentley MRN: [id number] DOB: 1961/03/04 Today's Date: 01/17/2020    History of Present Illness 59 y.o. male with medical history significant for severe mitral regurgitation s/p mitral valve repair and recurrent PE on Coumadin, history of SVT/PAT/PACs, chronic combined systolic and diastolic CHF (EF improved to 55-60%), history of stage II non-Hodgkin's lymphoma s/p chemotherapy with CHOP/Rituxan on observation, left first toe osteomyelitis s/p left great toe amputation, depression/anxiety, alcohol use 1 bottle a Opfer, and continued 1 pack/[place] tobacco use who presents to the ED for evaluation of left foot infection. Patient was admitted 3/5 progressive left foot pain left second toe drainage and antalgia. He underwent left great toe amputation on 3/10   Clinical Impression   Pt with decline in function and safety with ADLs and ADL mobility with impaired balance and endurance. Pt lives at home alone and was independent with ADLs/selfcare, home mgt, cooking and used a cane for mobility. Pt currently requires sup to sit EOB, mod A with LB ADLs, min A with toileting and min guard A with mobility using RW. Pt would benefit from acute OT services to address impairments to maximize level of function and safety    Follow Up Recommendations  Home Health OT;Supervision - Intermittent    Equipment Recommendations  3 in 1 bedside commode;Other (comment)(RW)    Recommendations for Other Services       Precautions / Restrictions Precautions Precautions: Other (comment) Precaution Comments: L foot,  VAC Restrictions Weight Bearing Restrictions: Yes LLE Weight Bearing: Touchdown weight bearing Other Position/Activity Restrictions: post op shoe      Mobility Bed Mobility Overal bed mobility: Needs Assistance Bed Mobility: Supine to Sit     Supine to sit: Supervision        Transfers Overall transfer level: Needs assistance Equipment  used: Rolling walker (2 wheeled) Transfers: Sit to/from [facility] Sit to Stand: Min guard Stand pivot transfers: Min guard       General transfer comment: cues for hand placement    Balance                                           ADL either performed or assessed with clinical judgement   ADL Overall ADL's : Needs assistance/impaired Eating/Feeding: Independent;Sitting   Grooming: Wash/dry hands;Wash/dry face;Oral care;Set up;Sitting   Upper Body Bathing: Set up;Sitting   Lower Body Bathing: Moderate assistance;Sitting/lateral leans   Upper Body Dressing : Set up;Sitting   Lower Body Dressing: Moderate assistance;Sitting/lateral leans   Toilet Transfer: Min guard;Ambulation;RW;Cueing for safety   Toileting- Clothing Manipulation and Hygiene: Minimal assistance;Sit to/from stand       Functional mobility during ADLs: Min guard;Rolling walker;Cueing for safety       Vision Patient Visual Report: No change from baseline       Perception     Praxis      Pertinent Vitals/Pain Pain Assessment: 0-10 Pain Score: 3  Pain Location: L foot Pain Descriptors / Indicators: Sore;Grimacing Pain Intervention(s): Limited activity within patient's tolerance;Monitored during session;Premedicated before session;Repositioned     Hand Dominance Right   Extremity/Trunk Assessment Upper Extremity Assessment Upper Extremity Assessment: Overall WFL for tasks assessed   Lower Extremity Assessment Lower Extremity Assessment: Defer to PT evaluation   Cervical / Trunk Assessment Cervical / Trunk Assessment: Normal   Communication Communication Communication: No difficulties   Cognition  Arousal/Alertness: Awake/alert Behavior During Therapy: WFL for tasks assessed/performed Overall Cognitive Status: Within Functional Limits for tasks assessed                                     General Comments       Exercises      Shoulder Instructions      Home Living Family/patient expects to be discharged to:: Private residence Living Arrangements: Alone Available Help at Discharge: Family;Friend(s) Type of Home: House Home Access: Stairs to enter [employer] of Steps: 3 Entrance Stairs-Rails: Right Home Layout: One level     Bathroom Shower/Tub: Teacher, early years/pre: Standard     Home Equipment: Cane - single point          Prior Functioning/Environment Level of Independence: Independent with assistive device(s)    ADL's / Homemaking Assistance Needed: was indepedendent with ADL/selfcare   Comments: ambulates with a cane        OT Problem List: Impaired balance (sitting and/or standing);Pain;Decreased activity tolerance;Decreased coordination;Decreased knowledge of use of DME or AE      OT Treatment/Interventions: Self-care/ADL training;DME and/or AE instruction;Therapeutic activities;Balance training;Patient/family education    OT Goals(Current goals can be found in the care plan section) Acute Rehab OT Goals Patient Stated Goal: go home OT Goal Formulation: With patient Time For Goal Achievement: 01/31/20 Potential to Achieve Goals: Good ADL Goals Pt Will Perform Grooming: with min guard assist;with supervision;with set-up;standing Pt Will Perform Lower Body Bathing: with min assist;sitting/lateral leans;with caregiver independent in assisting Pt Will Perform Lower Body Dressing: with min assist;sitting/lateral leans;with caregiver independent in assisting Pt Will Transfer to Toilet: with supervision;ambulating Pt Will Perform Toileting - Clothing Manipulation and hygiene: with min guard assist;with supervision;sit to/from stand  OT Frequency: Min 2X/week   Barriers to D/C:            Co-evaluation              AM-PAC OT "6 Clicks" Daily Activity     Outcome Measure Help from another person eating meals?: None Help from another person taking care  of personal grooming?: None Help from another person toileting, which includes using toliet, bedpan, or urinal?: A Little Help from another person bathing (including washing, rinsing, drying)?: A Lot Help from another person to put on and taking off regular upper body clothing?: None Help from another person to put on and taking off regular lower body clothing?: A Lot 6 Click Score: 19   End of Session Equipment Utilized During Treatment: Rolling walker  Activity Tolerance: Patient tolerated treatment well Patient left: in chair;with call bell/phone within reach;Other (comment)(with PT)  OT Visit Diagnosis: Unsteadiness on feet (R26.81);Other abnormalities of gait and mobility (R26.89);Pain Pain - Right/Left: Left Pain - part of body: Ankle and joints of foot                Time: [id number] OT Time Calculation (min): 35 min Charges:  OT General Charges $OT Visit: 1 Visit OT Evaluation $OT Eval Moderate Complexity: 1 Mod OT Treatments $Self Care/Home Management : 8-22 mins    Britt Bottom 01/17/2020, 1:51 PM

## 2020-01-17 NOTE — Evaluation (Signed)
Physical Therapy Evaluation Patient Details Name: Leonard Bentley MRN: [id number] DOB: Oct 27, 1961 Today's Date: 01/17/2020   History of Present Illness  Pt is a 59 y/o male s/p L 1st metatarsal and 2nd toe amputations. PMH including but not limited to CHF, lymphoma s/p chemotherapy, depression, anxiety and ETOH abuse.     Clinical Impression  Pt presented sitting upright in recliner chair finishing OT session, awake and willing to participate in therapy session. Prior to admission, pt reported that he ambulated with use of a cane and was independent with ADLs. Pt lives alone in a single level home with three steps to enter (one rail on R). At the time of evaluation, pt overall moving well with min guard for safety and use of RW. However, despite frequent cueing to maintain TDWB'ing L LE, pt quite clearly WB'ing through L LE with standing and ambulation. If pt is unable to maintain weight bearing precautions, the only other safe option would be for him to use a w/c for mobility. Plan is for pt to d/c home tomorrow. He will need stair training prior to d/c as well. Pt would continue to benefit from skilled physical therapy services at this time while admitted and after d/c to address the below listed limitations in order to improve overall safety and independence with functional mobility.     Follow Up Recommendations Home health PT;Supervision for mobility/OOB    Equipment Recommendations  Rolling walker with 5" wheels;3in1 (PT);Wheelchair (measurements PT);Wheelchair cushion (measurements PT)    Recommendations for Other Services       Precautions / Restrictions Precautions Precautions: Other (comment) Precaution Comments: wound VAC L foot Required Braces or Orthoses: Other Brace Other Brace: post-op shoe Restrictions Weight Bearing Restrictions: Yes LLE Weight Bearing: Touchdown weight bearing Other Position/Activity Restrictions: post op shoe      Mobility  Bed Mobility Overal bed  mobility: Needs Assistance Bed Mobility: Supine to Sit     Supine to sit: Supervision     General bed mobility comments: pt OOB in recliner chair finishing OT session upon arrival  Transfers Overall transfer level: Needs assistance Equipment used: Rolling walker (2 wheeled) Transfers: Sit to/from Stand Sit to Stand: Min guard Stand pivot transfers: Min guard       General transfer comment: cueing for safe hand placement and to maintain TDWB'ing L LE; however, pt clearly unable to maintain  Ambulation/Gait Ambulation/Gait assistance: Min guard Gait Distance (Feet): 30 Feet Assistive device: Rolling walker (2 wheeled) Gait Pattern/deviations: Step-to pattern;Decreased weight shift to left Gait velocity: decreased   General Gait Details: despite frequent cuein to maintain TDWB'ing L LE, pt clearly unable to maintain with standing and ambulation; pt also requiring frequent standing rest breaks secondary to fatigue  Stairs            Wheelchair Mobility    Modified Rankin (Stroke Patients Only)       Balance Overall balance assessment: Needs assistance Sitting-balance support: Feet supported Sitting balance-Leahy Scale: Good     Standing balance support: Bilateral upper extremity supported Standing balance-Leahy Scale: Poor                               Pertinent Vitals/Pain Pain Assessment: 0-10 Pain Score: 3  Pain Location: L foot Pain Descriptors / Indicators: Sore;Grimacing Pain Intervention(s): Monitored during session;Repositioned    Home Living Family/patient expects to be discharged to:: Private residence Living Arrangements: Alone Available Help at Discharge: Family;Friend(s)  Type of Home: House Home Access: Stairs to enter Entrance Stairs-Rails: Right Entrance Stairs-Number of Steps: 3 Home Layout: One level Home Equipment: Cane - single point      Prior Function Level of Independence: Independent with assistive device(s)       ADL's / Homemaking Assistance Needed: was indepedendent with ADL/selfcare  Comments: ambulates with a cane     Hand Dominance   Dominant Hand: Right    Extremity/Trunk Assessment   Upper Extremity Assessment Upper Extremity Assessment: Generalized weakness(tremors noted bilaterally throughout)    Lower Extremity Assessment Lower Extremity Assessment: Generalized weakness;LLE deficits/detail LLE Deficits / Details: pt with difficulty maintaining TDWB     Cervical / Trunk Assessment Cervical / Trunk Assessment: Normal  Communication   Communication: No difficulties  Cognition Arousal/Alertness: Awake/alert Behavior During Therapy: WFL for tasks assessed/performed Overall Cognitive Status: Within Functional Limits for tasks assessed                                        General Comments      Exercises     Assessment/Plan    PT Assessment Patient needs continued PT services  PT Problem List Decreased strength;Decreased range of motion;Decreased activity tolerance;Decreased balance;Decreased mobility;Decreased coordination;Decreased knowledge of use of DME;Decreased safety awareness;Decreased knowledge of precautions;Pain       PT Treatment Interventions DME instruction;Gait training;Stair training;Functional mobility training;Therapeutic activities;Therapeutic exercise;Balance training;Neuromuscular re-education;Patient/family education    PT Goals (Current goals can be found in the Care Plan section)  Acute Rehab PT Goals Patient Stated Goal: to be able to run again PT Goal Formulation: With patient Time For Goal Achievement: 01/31/20 Potential to Achieve Goals: Good    Frequency Min 5X/week   Barriers to discharge        Co-evaluation               AM-PAC PT "6 Clicks" Mobility  Outcome Measure Help needed turning from your back to your side while in a flat bed without using bedrails?: None Help needed moving from lying on your  back to sitting on the side of a flat bed without using bedrails?: None Help needed moving to and from a bed to a chair (including a wheelchair)?: None Help needed standing up from a chair using your arms (e.g., wheelchair or bedside chair)?: None Help needed to walk in hospital room?: A Little Help needed climbing 3-5 steps with a railing? : A Lot 6 Click Score: 21    End of Session Equipment Utilized During Treatment: Gait belt Activity Tolerance: Patient tolerated treatment well Patient left: in chair;with call bell/phone within reach;with chair alarm set Nurse Communication: Mobility status;Precautions;Weight bearing status PT Visit Diagnosis: Other abnormalities of gait and mobility (R26.89);Muscle weakness (generalized) (M62.81);Pain Pain - Right/Left: Left Pain - part of body: Ankle and joints of foot    Time: [id number] PT Time Calculation (min) (ACUTE ONLY): 23 min   Charges:   PT Evaluation $PT Eval Moderate Complexity: 1 Mod PT Treatments $Gait Training: 8-22 mins        Anastasio Champion, DPT  Acute Rehabilitation Services Pager [phone] Office [place] 01/17/2020, 3:01 PM

## 2020-01-17 NOTE — Progress Notes (Signed)
PROGRESS NOTE Disposition-likely discharge a.m. 01/18/2020 after seen by therapy services and ambulation and learning to use Lovenox   Leonard Bentley  [id number] DOB: 08/01/1961 DOA: 01/11/2020 PCP: [name], NP   Brief Narrative:   59 y.o. male with medical history significant for severe mitral regurgitation s/p mitral valve repair and recurrent PE on Coumadin, history of SVT/PAT/PACs, chronic combined systolic and diastolic CHF (EF improved to 55-60%), history of stage II non-Hodgkin's lymphoma s/p chemotherapy with CHOP/Rituxan on observation, left first toe osteomyelitis s/p left great toe amputation, depression/anxiety, alcohol use 1 bottle a Gonsalez, and continued 1 pack/[place]comb tobacco use who presents to the ED for evaluation of left foot infection.  Patient was admitted 3/5 progressive left foot pain left second toe drainage and antalgia He underwent left great toe amputation on 3/10 and is currently awaiting disposition to discharge home after therapy sees him   Assessment & Plan:   Principal Problem:   Left foot infection Active Problems:   History of pulmonary embolus (PE)   S/P minimally-invasive mitral valve repair   Chronic combined systolic and diastolic heart failure (HCC)   Cellulitis of left foot  Left foot infection at second toe/s/p left great toe amputation/left toe osteomyelitis: CT foot confirms left toe osteomyelitis.  Consulted orthopedics.  Seen by Dr. [name] and had surgery great toe amputation 3/10-has ambulated minimally since procedure and does not have support at home as brother cannot house him temporarily postop-therapy services to see -plan for discharge 01/18/2020  Severe mitral regurgitation s/p mitral valve repair and history of PE: Patient takes Coumadin at home which is on hold due to anticipated surgery.--I have transitioned heparin to Lovenox weight-based 1.5/kg in addition to Coumadin 5 mg He may need medications delivered to bedside  History of  SVT/PAT/PACs: Controlled.  Continue Toprol-XL 50 daily  Hypomagnesemia: Magnesium 1.7 up from 1.3  Chronic combined systolic and diastolic CHF: EF improved to 55-60%, he is no longer requiring diuretic therapy.  He is overall volume depleted.  Saline lock 3/10  Alcohol use: Reports drinking at least 1 bottle of wine nightly with last drink 12 hours prior to admission.  Has anxiety and underlying tremors but CIWA scores are normal-discontinued protocol on 3/11  History of stage II non-Hodgkin's lymphoma: S/p chemotherapy with CHOP/Rituxan currently in observation.  Follows with oncology, Dr. Lorna Few. His blood counts improved postoperatively and this was likely a combination of suppression from infection in the setting of his malignancy  Tobacco use: Smokes 1 pack/[place].  Advised on cutting back and eventual cessation.  Nicotine patch   Impaired glucose tolerance   DVT prophylaxis: Heparin drip   Code Status: Full Code  Family Communication: Plan was discussed with patient who understands clearly Patient is from: Home Disposition Plan: As above  Consultants:   Orthopedics  Procedures:   None  Antimicrobials:   None   Subjective:  Doing fair eating drinking mild tremors Pain seems controlled-had difficulty getting up this morning No chest pain no chills no rigors  Objective: Vitals:   01/16/20 2346 01/17/20 0432 01/17/20 0500 01/17/20 0855  BP: (!) 121/110 (!) 110/96  112/89  Pulse: 92 79  92  Resp: 18 18    Temp: 97.7 F (36.5 C) 98.2 F (36.8 C)    TempSrc: Oral Oral    SpO2: 99% 100%  100%  Weight:   102.4 kg   Height:        Intake/Output Summary (Last 24 hours) at 01/17/2020 0919 Last  data filed at 01/17/2020 0500 Gross per 24 hour  Intake 2390 ml  Output 1325 ml  Net 1065 ml   Filed Weights   01/11/20 1931 01/16/20 1144 01/17/20 0500  Weight: 90.3 kg 90 kg 102.4 kg    Examination:  EOMI NCAT no focal deficit smiling slightly  anxious however Chest clear no added sound no rales Abdomen soft no rebound no guarding no rebound Neurologically intact seems appropriate and coherent Power 5/5-gait not assessed today given postop and in bed Wound VAC in place with dressings   Data Reviewed: I have personally reviewed following labs and imaging studies  CBC: Recent Labs  Lab 01/11/20 2230 01/12/20 0208 01/13/20 0436 01/14/20 0213 01/15/20 0416 01/16/20 0445 01/17/20 0432  WBC 4.3   < > 3.5* 2.8* 2.9* 2.5* 4.6  NEUTROABS 3.4  --   --   --   --   --  3.9  HGB 13.6   < > 11.1* 10.6* 10.2* 10.5* 10.8*  HCT 41.0   < > 34.3* 33.2* 30.8* 31.6* 32.7*  MCV 83.0   < > 85.1 85.3 83.0 82.7 83.4  PLT 209   < > 178 166 172 169 188   < > = values in this interval not displayed.   Basic Metabolic Panel: Recent Labs  Lab 01/12/20 0208 01/12/20 0208 01/13/20 0436 01/13/20 0436 01/14/20 1202 01/15/20 0416 01/15/20 0856 01/16/20 0445 01/17/20 0432  NA 136   < > 138  --   --  135 136 137 134*  K 4.6   < > 4.3   < > 4.1 3.8 4.0 3.6 4.1  CL 101   < > 103  --   --  104 107 107 102  CO2 23   < > 24  --   --  21* 21* 23 22  GLUCOSE 102*   < > 99  --   --  105* 103* 108* 148*  BUN 5*   < > 12  --   --  10 8 7 8   CREATININE 0.90   < > 0.94  --   --  0.85 0.80 0.83 0.79  CALCIUM 8.5*   < > 8.4*  --   --  8.7* 8.5* 8.5* 8.7*  MG 1.4*  --   --   --  1.5* 1.3*  --  1.7  --   PHOS 3.8  --   --   --   --  3.3  --   --   --    < > = values in this interval not displayed.   GFR: Estimated Creatinine Clearance: 116.8 mL/min (by C-G formula based on SCr of 0.79 mg/dL). Liver Function Tests: Recent Labs  Lab 01/11/20 2230 01/12/20 0208  AST 105* 92*  ALT 79* 73*  ALKPHOS 57 55  BILITOT 0.8 1.3*  PROT 6.6 6.4*  ALBUMIN 3.4* 3.3*   No results for input(s): LIPASE, AMYLASE in the last 168 hours. No results for input(s): AMMONIA in the last 168 hours. Coagulation Profile: Recent Labs  Lab 01/13/20 0436 01/14/20 0213  01/15/20 0416 01/16/20 0445 01/17/20 0432  INR 1.7* 1.3* 1.2 1.2 1.1   Cardiac Enzymes: No results for input(s): CKTOTAL, CKMB, CKMBINDEX, TROPONINI in the last 168 hours. BNP (last 3 results) No results for input(s): PROBNP in the last 8760 hours. HbA1C: No results for input(s): HGBA1C in the last 72 hours. CBG: No results for input(s): GLUCAP in the last 168 hours. Lipid Profile: No results for  input(s): CHOL, HDL, LDLCALC, TRIG, CHOLHDL, LDLDIRECT in the last 72 hours. Thyroid Function Tests: No results for input(s): TSH, T4TOTAL, FREET4, T3FREE, THYROIDAB in the last 72 hours. Anemia Panel: No results for input(s): VITAMINB12, FOLATE, FERRITIN, TIBC, IRON, RETICCTPCT in the last 72 hours. Sepsis Labs: Recent Labs  Lab 01/11/20 2230 01/12/20 0208 01/12/20 1458  LATICACIDVEN 3.3* 2.1* 2.0*    Recent Results (from the past 240 hour(s))  Blood culture (routine x 2)     Status: None   Collection Time: 01/11/20  8:39 PM   Specimen: BLOOD  Result Value Ref Range Status   Specimen Description BLOOD SITE NOT SPECIFIED  Final   Special Requests   Final    BOTTLES DRAWN AEROBIC AND ANAEROBIC Blood Culture results may not be optimal due to an inadequate volume of blood received in culture bottles   Culture   Final    NO GROWTH 5 DAYS Performed at [facility], [place] [address], [place], [place] [address]    Report Status 01/16/2020 FINAL  Final  Blood culture (routine x 2)     Status: None   Collection Time: 01/11/20 10:30 PM   Specimen: BLOOD  Result Value Ref Range Status   Specimen Description BLOOD SITE NOT SPECIFIED  Final   Special Requests AEROBIC BOTTLE ONLY Blood Culture adequate volume  Final   Culture   Final    NO GROWTH 5 DAYS Performed at [facility], [place] [address] [place], [place] [address]    Report Status 01/17/2020 FINAL  Final  SARS CORONAVIRUS 2 (TAT 6-24 HRS) Nasopharyngeal Nasopharyngeal Swab     Status: None   Collection Time: 01/12/20  12:47 AM   Specimen: Nasopharyngeal Swab  Result Value Ref Range Status   SARS Coronavirus 2 NEGATIVE NEGATIVE Final    Comment: (NOTE) SARS-CoV-2 target nucleic acids are NOT DETECTED. The SARS-CoV-2 RNA is generally detectable in upper and lower respiratory specimens during the acute phase of infection. Negative results do not preclude SARS-CoV-2 infection, do not rule out co-infections with other pathogens, and should not be used as the sole basis for treatment or other patient management decisions. Negative results must be combined with clinical observations, patient history, and epidemiological information. The expected result is Negative. Fact Sheet for Patients: SugarRoll.be Fact Sheet for Healthcare Providers: https://www.woods-mathews.com/ This test is not yet approved or cleared by the [place] FDA and  has been authorized for detection and/or diagnosis of SARS-CoV-2 by FDA under an Emergency Use Authorization (EUA). This EUA will remain  in effect (meaning this test can be used) for the duration of the COVID-19 declaration under Section 56 4(b)(1) of the Act, 21 U.S.C. section 360bbb-3(b)(1), unless the authorization is terminated or revoked sooner. Performed at [facility], [place] [address], [place], [place] [address]   Surgical pcr screen     Status: None   Collection Time: 01/15/20 11:50 AM   Specimen: Nasal Mucosa; Nasal Swab  Result Value Ref Range Status   MRSA, PCR NEGATIVE NEGATIVE Final   Staphylococcus aureus NEGATIVE NEGATIVE Final    Comment: (NOTE) The Xpert SA Assay (FDA approved for NASAL specimens in patients 9 years of age and older), is one component of a comprehensive surveillance program. It is not intended to diagnose infection nor to guide or monitor treatment. Performed at [facility], [place] [address] [place], [place] [address]       Radiology Studies: No results  found.  Scheduled  Meds: . docusate sodium  100 mg Oral BID  . enoxaparin (LOVENOX) injection  1.5 mg/kg Subcutaneous Q24H  . folic acid  1 mg Oral Daily  . metoprolol succinate  50 mg Oral Daily  . multivitamin with minerals  1 tablet Oral Daily  . nicotine  21 mg Transdermal Daily  . thiamine  100 mg Oral Daily   Or  . thiamine  100 mg Intravenous Daily  . warfarin  5 mg Oral ONCE-1800  . Warfarin - Physician Dosing Inpatient   Does not apply q1800   Continuous Infusions: . sodium chloride Stopped (01/16/20 1235)  . sodium chloride 75 mL/hr at 01/16/20 1600     LOS: 5 days   Time spent: 28 minutes   Nita Sells, MD [facility]  01/17/2020, 9:19 AM   To contact the attending provider between 7A-7P or the covering provider during after hours 7P-7A, please log into the web site [email or web address].

## 2020-01-17 NOTE — TOC Progression Note (Signed)
Transition of Care [facility]) - Progression Note    Patient Details  Name: Leonard Bentley MRN: [id number] Date of Birth: 1961/10/30  [facility] CM/SW Contact  Curlene Labrum, RN Phone Number: 01/17/2020, 10:17 AM  Clinical Narrative:     Probable discharge to home tomorrow.  Patient lives alone and brother not available to assist with home care until tomorrow.  Sent message to [facility] pharmacy to establish costs of discharge medications due to needed Lovenox to Coumadin transition.  Expected Discharge Plan: [place] Barriers to Discharge: No Barriers Identified  Expected Discharge Plan and Services Expected Discharge Plan: [place]   Discharge Planning Services: CM Consult   Living arrangements for the past 2 months: Single Family Home Expected Discharge Date: 01/17/20                                     Social Determinants of Health (SDOH) Interventions    Readmission Risk Interventions Readmission Risk Prevention Plan 01/16/2020 01/18/2019  Transportation Screening Complete Complete  PCP or Specialist Appt within 5-7 Days Complete -  Home Care Screening Complete -  Medication Review (RN CM) Complete -  Medication Review ([place]) - Complete  HRI or [place] - Complete  Newborn - Not Applicable  Some recent data might be hidden

## 2020-01-17 NOTE — Discharge Instructions (Signed)
Information on my medicine - Coumadin   (Warfarin)  This medication education was reviewed with me or my healthcare representative as part of my discharge preparation.   You were taking this mediation prior to this hospital admission.  Why was Coumadin prescribed for you? Coumadin was prescribed for you because you have a blood clot or a medical condition that can cause an increased risk of forming blood clots. Blood clots can cause serious health problems by blocking the flow of blood to the heart, lung, or brain. Coumadin can prevent harmful blood clots from forming. As a reminder your indication for Coumadin is:   Blood Clot Prevention After Heart Valve Surgery  History of recurrent PE and  mitral valve repair.  What test will check on my response to Coumadin? While on Coumadin (warfarin) you will need to have an INR test regularly to ensure that your dose is keeping you in the desired range. The INR (international normalized ratio) number is calculated from the result of the laboratory test called prothrombin time (PT).  If an INR APPOINTMENT HAS NOT ALREADY BEEN MADE FOR YOU please schedule an appointment to have this lab work done by your health care provider within 7 days. Your INR goal is usually a number between:  2 to 3 or your provider may give you a more narrow range like 2-2.5.  Ask your health care provider during an office visit what your goal INR is.  What  do you need to  know  About  COUMADIN? Take Coumadin (warfarin) exactly as prescribed by your healthcare provider about the same time each Juniel.  DO NOT stop taking without talking to the doctor who prescribed the medication.  Stopping without other blood clot prevention medication to take the place of Coumadin may increase your risk of developing a new clot or stroke.  Get refills before you run out.  What do you do if you miss a dose? If you miss a dose, take it as soon as you remember on the same [place] then continue your  regularly scheduled regimen the next [place].  Do not take two doses of Coumadin at the same time.  Important Safety Information A possible side effect of Coumadin (Warfarin) is an increased risk of bleeding. You should call your healthcare provider right away if you experience any of the following: ? Bleeding from an injury or your nose that does not stop. ? Unusual colored urine (red or dark brown) or unusual colored stools (red or black). ? Unusual bruising for unknown reasons. ? A serious fall or if you hit your head (even if there is no bleeding).  Some foods or medicines interact with Coumadin (warfarin) and might alter your response to warfarin. To help avoid this: ? Eat a balanced diet, maintaining a consistent amount of Vitamin K. ? Notify your provider about major diet changes you plan to make. ? Avoid alcohol or limit your intake to 1 drink for women and 2 drinks for men per [place]. (1 drink is 5 oz. wine, 12 oz. beer, or 1.5 oz. liquor.)  Make sure that ANY health care provider who prescribes medication for you knows that you are taking Coumadin (warfarin).  Also make sure the healthcare provider who is monitoring your Coumadin knows when you have started a new medication including herbals and non-prescription products.  Coumadin (Warfarin)  Major Drug Interactions  Increased Warfarin Effect Decreased Warfarin Effect  Alcohol (large quantities) Antibiotics (esp. Septra/Bactrim, Flagyl, Cipro) Amiodarone (Cordarone) Aspirin (ASA)  Cimetidine (Tagamet) Megestrol (Megace) NSAIDs (ibuprofen, naproxen, etc.) Piroxicam (Feldene) Propafenone (Rythmol SR) Propranolol (Inderal) Isoniazid (INH) Posaconazole (Noxafil) Barbiturates (Phenobarbital) Carbamazepine (Tegretol) Chlordiazepoxide (Librium) Cholestyramine (Questran) Griseofulvin Oral Contraceptives Rifampin Sucralfate (Carafate) Vitamin K   Coumadin (Warfarin) Major Herbal Interactions  Increased Warfarin Effect Decreased  Warfarin Effect  Garlic Ginseng Ginkgo biloba Coenzyme Q10 Green tea St. John's wort    Coumadin (Warfarin) FOOD Interactions  Eat a consistent number of servings per week of foods HIGH in Vitamin K (1 serving =  cup)  Collards (cooked, or boiled & drained) Kale (cooked, or boiled & drained) Mustard greens (cooked, or boiled & drained) Parsley *serving size only =  cup Spinach (cooked, or boiled & drained) Swiss chard (cooked, or boiled & drained) Turnip greens (cooked, or boiled & drained)  Eat a consistent number of servings per week of foods MEDIUM-HIGH in Vitamin K (1 serving = 1 cup)  Asparagus (cooked, or boiled & drained) Broccoli (cooked, boiled & drained, or raw & chopped) Brussel sprouts (cooked, or boiled & drained) *serving size only =  cup Lettuce, raw (green leaf, endive, romaine) Spinach, raw Turnip greens, raw & chopped   These websites have more information on Coumadin (warfarin):  FailFactory.se; VeganReport.com.au;

## 2020-01-18 LAB — PROTIME-INR
INR: 1.1 (ref 0.8–1.2)
Prothrombin Time: 13.8 seconds (ref 11.4–15.2)

## 2020-01-18 NOTE — Progress Notes (Signed)
POD 2 . Doing well pain controlled. Vac working . 0 cc.   Will need 1 week follow up with Dr [name] to remove vac

## 2020-01-18 NOTE — Progress Notes (Signed)
Physical Therapy Treatment Patient Details Name: Leonard Bentley MRN: [id number] DOB: Oct 29, 1961 Today's Date: 01/18/2020    History of Present Illness Pt is a 59 y/o male s/p L 1st metatarsal and 2nd toe amputations. PMH including but not limited to CHF, lymphoma s/p chemotherapy, depression, anxiety and ETOH abuse.     PT Comments    Pt is motivated to progress towards goals and functional mobility however, he continues to demonstrate difficulty with maintaining TDWB status on L LE. He requires frequent verbal and tactile cues to maintain TDWB status during transfers, ambulation, and stair navigation. Upon further inquiry, pt states his home environment is not conducive for safe mobility due to excessive clutter and lack of caregiver support. Therefore, recommending a WC for primary means of mobility. Additionally, concerns regarding entry into home as he has 4 steps to navigate and they are higher than standard stairs with only 1 hand rail on R. Again, he lacks caregiver support that he requires for getting in and out of his home. During this session he was able to ascend the half-step backwards, with a RW and min guard. He requires frequent verbal and tactile cues to maintain TDWB status but continues to demonstrate difficulty with adherence. Standard steps were not attempted for safety concerns. Therefore, for pt to d/c home, recommending ambulance transport for safety.   Pt would continue to benefit from skilled physical therapy services at this time while admitted and after d/c to address the below listed limitations in order to improve overall safety and independence with functional mobility.   Follow Up Recommendations  Home health PT;Supervision for mobility/OOB     Equipment Recommendations  Rolling walker with 5" wheels;3in1 (PT);Wheelchair (measurements PT);Wheelchair cushion (measurements PT);Other (comment)(pt will need ambulance transport home)    Recommendations for Other Services        Precautions / Restrictions Precautions Precautions: Other (comment) Precaution Comments: wound VAC L foot Required Braces or Orthoses: Other Brace Other Brace: post-op shoe Restrictions Weight Bearing Restrictions: Yes LLE Weight Bearing: Touchdown weight bearing Other Position/Activity Restrictions: post op shoe    Mobility  Bed Mobility Overal bed mobility: Needs Assistance Bed Mobility: Supine to Sit     Supine to sit: Supervision     General bed mobility comments: Needs help managing wound vac   Transfers Overall transfer level: Needs assistance Equipment used: Rolling walker (2 wheeled) Transfers: Sit to/from Stand Sit to Stand: Min guard         General transfer comment: Cueing for safe sequencing and hand placement on RW. Verbal and tactile cues required to maintain TDWB staus, though pt clearly unable to maintain  Ambulation/Gait Ambulation/Gait assistance: Min guard Gait Distance (Feet): 15 Feet Assistive device: Rolling walker (2 wheeled) Gait Pattern/deviations: Step-to pattern;Decreased weight shift to left Gait velocity: decreased   General Gait Details: Frequent verbal and tactile cueing to maintain TDWB status in L LE, pt unable to maintain status during ambulation.   Stairs Stairs: Yes Stairs assistance: Min assist Stair Management: Backwards;With walker Number of Stairs: 0.5 General stair comments: Pt demonstrated inability to 'hop' high enough to clear standard stair height. He was able to ascend the half-step height backwards with RW and min assist. Discussed options to ascend/descend stairs safely while maintaining TDWB status, will need assistance to navigate stairs   Wheelchair Mobility    Modified Rankin (Stroke Patients Only)       Balance Overall balance assessment: Needs assistance Sitting-balance support: Feet supported Sitting balance-Leahy Scale: Good  Standing balance support: Bilateral upper extremity  supported;During functional activity Standing balance-Leahy Scale: Poor                              Cognition Arousal/Alertness: Awake/alert Behavior During Therapy: WFL for tasks assessed/performed Overall Cognitive Status: Within Functional Limits for tasks assessed                                        Exercises      General Comments        Pertinent Vitals/Pain Pain Assessment: 0-10 Pain Score: 8  Pain Location: L foot Pain Descriptors / Indicators: Discomfort;Sore;Grimacing Pain Intervention(s): Monitored during session;Repositioned;Patient requesting pain meds-RN notified    Home Living                      Prior Function            PT Goals (current goals can now be found in the care plan section) Acute Rehab PT Goals Patient Stated Goal: to be able to run again PT Goal Formulation: With patient Time For Goal Achievement: 01/31/20 Potential to Achieve Goals: Good Progress towards PT goals: Progressing toward goals    Frequency    Min 5X/week      PT Plan Current plan remains appropriate    Co-evaluation              AM-PAC PT "6 Clicks" Mobility   Outcome Measure  Help needed turning from your back to your side while in a flat bed without using bedrails?: None Help needed moving from lying on your back to sitting on the side of a flat bed without using bedrails?: None Help needed moving to and from a bed to a chair (including a wheelchair)?: A Little Help needed standing up from a chair using your arms (e.g., wheelchair or bedside chair)?: None Help needed to walk in hospital room?: A Lot Help needed climbing 3-5 steps with a railing? : A Lot 6 Click Score: 19    End of Session Equipment Utilized During Treatment: Gait belt Activity Tolerance: Patient tolerated treatment well Patient left: Other (comment)(in restroom, with pull-cord available & nurse and NT told) Nurse Communication: Mobility  status;Patient requests pain meds;Other (comment)(pt using restroom and told to request help) PT Visit Diagnosis: Other abnormalities of gait and mobility (R26.89);Muscle weakness (generalized) (M62.81);Pain Pain - Right/Left: Left Pain - part of body: Ankle and joints of foot     Time: [id number] PT Time Calculation (min) (ACUTE ONLY): 46 min  Charges:  $Gait Training: 8-22 mins $Therapeutic Activity: 23-37 mins                     Leonard Bentley, SPT Acute Rehab  [id number]    Sadik Piascik 01/18/2020, 10:52 AM

## 2020-01-18 NOTE — Discharge Summary (Signed)
Physician Discharge Summary  Leonard Bentley [id number] DOB: 1961/09/27 DOA: 01/11/2020  PCP: [name], NP  Admit date: 01/11/2020 Discharge date: 01/18/2020  Time spent: 35 minutes  Recommendations for Outpatient Follow-up:  1. Recommend outpatient wound follow-up Dr. [name] 1 to 2 weeks with attention also focused on right lower extremity given he has demarcating areas on the right toe which are consistent with dry gangrene 2. Recommend wound VAC per orthopedics 3. Needs Lovenox to Coumadin bridge for history of prior PE-needs INR check in about 3 to 4 days and adjust dose of Coumadin subsequently based on this 4. Recommend CBC Chem-12 1 week 5. Outpatient follow-up Dr. Lorna Few for stage II non-Hodgkin's 6. Smoking cessation stressed  Discharge Diagnoses:  Principal Problem:   Left foot infection Active Problems:   History of pulmonary embolus (PE)   S/P minimally-invasive mitral valve repair   Chronic combined systolic and diastolic heart failure (HCC)   Cellulitis of left foot   Discharge Condition: Improved  Diet recommendation: Heart healthy  Filed Weights   01/11/20 1931 01/16/20 1144 01/17/20 0500  Weight: 90.3 kg 90 kg 102.4 kg    History of present illness:  59 y.o.malewith medical history significant forsevere mitral regurgitation s/p mitral valve repair and recurrent PE on Coumadin, history of SVT/PAT/PACs, chronic combined systolic and diastolic CHF (EF improved to 55-60%), history of stage II non-Hodgkin's lymphoma s/p chemotherapy with CHOP/Rituxan on observation, left first toe osteomyelitis s/p left great toe amputation, depression/anxiety, alcohol use 1 bottle a Regino, and continued 1 pack/[place] tobacco use who presents to the ED for evaluation of left foot infection.  Patient was admitted 3/5 progressive left foot pain left second toe drainage and antalgia He underwent left great toe amputation on 3/10 and is currently awaiting disposition to  discharge home after therapy sees him   Hospital Course:  Left foot infection at second toe/s/p left great toe amputation/left toe osteomyelitis: CT foot confirms left toe osteomyelitis.  Consulted orthopedics.  Seen by Dr. [name] and had surgery great toe amputation 3/10-therapy recommended home health walker wheelchair and home therapies on discharge   Severe mitral regurgitation s/p mitral valve repair and history of PE: Patient takes Coumadin at home which is on hold due to anticipated surgery.--I have transitioned heparin to Lovenox weight-based 1.5/kg in addition to Coumadin5 mg daily--will need coumadin clinic follow-up  History of SVT/PAT/PACs: Controlled.  Continue Toprol-XL 50 daily  Hypomagnesemia: Magnesium 1.7 up from 1.3--periodic checks as OP  Chronic combined systolic and diastolic CHF: EF improved to 55-60%, he is no longer requiring diuretic therapy.  overall has stabilized since admit  Alcohol use: Reports drinking at least 1 bottle of wine nightly with last drink 12 hours prior to admission.  Has anxiety and underlying tremors but CIWA scores are normal-discontinued protocol on 3/11  History of stage II non-Hodgkin's lymphoma: S/p chemotherapy with CHOP/Rituxan currently in observation.  Follows with oncology, Dr. Lorna Few. His blood counts improved postoperatively and this was likely a combination of suppression from infection in the setting of his malignancy  Tobacco use: Smokes 1 pack/Hable.  Advised on cutting back and eventual cessation.  Nicotine patch   Impaired glucose tolerance-OP A1c   Procedures:  Amputation of left great toe with VAC placement   Consultations:  Orthopedics Dr. [name]  Discharge Exam: Vitals:   01/18/20 0500 01/18/20 0900  BP: 131/80 (!) 143/102  Pulse: 86 69  Resp: 18 15  Temp: 98.5 F (36.9 C) 98.5 F (36.9  C)  SpO2: 97% 100%    General: Awake alert coherent no distress Cardiovascular: S1-S2 no murmur rub  or gallop Respiratory: Clinically clear no added sound no rales no rhonchi Abdomen soft nontender Wound on left foot wrapped left and Ace bandage present VAC in place not examined Right lower extremity has areas of dark scab and what looks like potentially dry gangrene at the toes of the great toe-pulses were bounding and dorsalis pedis-see picture below may need to be followed up by Dr. [name] as outpatient      Discharge Instructions   Discharge Instructions    Diet - low sodium heart healthy   Complete by: As directed    Discharge instructions   Complete by: As directed    You will need to follow-up with Dr. [name] in the outpatient setting for further management of your orthopedic issues You will need to bridge with Lovenox to Coumadin for the next week and get an INR checked at your primary physician's office-in other words you will need to take Lovenox and Coumadin at the same time until your INR is above the recommended range for you which is 2.0 We have given you pain control with Tylenol first choice and opioids as second choice take p.o. opioids short-term prescription will be given and called into your pharmacy Please follow-up with recommendations to quit smoking etc. and please desist from drinking Get labs at your primary care physician office in the next week   Increase activity slowly   Complete by: As directed    Negative Pressure Wound Therapy - Incisional   Complete by: As directed    Show patient how to attach prevena pump     Allergies as of 01/18/2020      Reactions   Other Other (See Comments)   "Cactus" caused blisters on tongue (if prepared as food)      Medication List    STOP taking these medications   diclofenac sodium 1 % Gel Commonly known as: VOLTAREN     TAKE these medications   acetaminophen 325 MG tablet Commonly known as: TYLENOL Take 2 tablets (650 mg total) by mouth every 6 (six) hours as needed for mild pain (or Fever >/= 101).     enoxaparin 150 MG/ML injection Commonly known as: LOVENOX Inject 1 mL (150 mg total) into the skin daily for 7 doses.   metoprolol succinate 50 MG 24 hr tablet Commonly known as: TOPROL-XL Take 1 tablet (50 mg total) by mouth daily. Take with or immediately following a meal.   nicotine 21 mg/24hr patch Commonly known as: NICODERM CQ - dosed in mg/24 hours Place 1 patch (21 mg total) onto the skin daily.   oxyCODONE 5 MG immediate release tablet Commonly known as: Oxy IR/ROXICODONE Take 1 tablet (5 mg total) by mouth every 4 (four) hours as needed for moderate pain or breakthrough pain (Hold & Call MD if SBP<90, HR<65, RR<10, O2<90, or altered mental status.).   warfarin 5 MG tablet Commonly known as: COUMADIN Take as directed. If you are unsure how to take this medication, talk to your nurse or doctor. Original instructions: Take 1 tablet by mouth daily. Follow-up with [facility]in Clinic for INR and dosing instructions What changed:   medication strength  additional instructions     ASK your doctor about these medications   enoxaparin Kit Commonly known as: LOVENOX 1 each by Does not apply route once for 1 dose. Ask about: Should I take this medication?  Durable Medical Equipment  (From admission, onward)         Start     Ordered   01/17/20 1452  For home use only DME 3 n 1  Once     01/17/20 1454   01/17/20 1449  For home use only DME Walker rolling  Once    Question Answer Comment  Walker: With 5 Inch Wheels   Patient needs a walker to treat with the following condition Amputation of great toe ([place])      01/17/20 1454         Allergies  Allergen Reactions  . Other Other (See Comments)    "Cactus" caused blisters on tongue (if prepared as food)   Follow-up Information    [name], MD In 1 week.   Specialty: Orthopedic Surgery Contact information: [place] [address] [phone]        [name], NP.  Schedule an appointment as soon as possible for a visit.   Specialty: Nurse Practitioner Why: You will need to bridge with Lovenox to Coumadin for the next week and get an INR checked at your primary physician's office-in other words you will need to take Lovenox and Coumadin at the same time until your INR is above the recommended range for you  Contact information: [place] [address] [phone]            The results of significant diagnostics from this hospitalization (including imaging, microbiology, ancillary and laboratory) are listed below for reference.    Significant Diagnostic Studies: DG Chest 2 View  Result Date: 01/11/2020 CLINICAL DATA:  Shortness of breath.  Foot infection. EXAM: CHEST - 2 VIEW COMPARISON:  09/28/2019 FINDINGS: Previous mitral valve replacement. Heart size is normal. Aorta is tortuous. Chronic lung markings are again demonstrated, but without evidence of active infiltrate, effusion or collapse. No bone abnormality. IMPRESSION: No active disease.  Previous mitral valve replacement. Electronically Signed   By: Nelson Chimes M.D.   On: 01/11/2020 22:01   DG ELBOW COMPLETE RIGHT (3+VIEW)  Result Date: 01/11/2020 CLINICAL DATA:  Foot infection.  Elbow pain. EXAM: RIGHT ELBOW - COMPLETE 3+ VIEW COMPARISON:  None. FINDINGS: There is no evidence of fracture, dislocation, or joint effusion. There is no evidence of arthropathy or other focal bone abnormality. Soft tissues are unremarkable. IMPRESSION: Negative. Electronically Signed   By: Nelson Chimes M.D.   On: 01/11/2020 22:02   CT FOOT LEFT W CONTRAST  Result Date: 01/12/2020 CLINICAL DATA:  Assess for left foot osteomyelitis. Foot infection. Patient states he cannot tolerate MRI. EXAM: CT OF THE LOWER LEFT EXTREMITY WITH CONTRAST TECHNIQUE: Multidetector CT imaging of the lower left extremity was performed according to the standard protocol following intravenous contrast administration.  COMPARISON:  Radiograph yesterday. CONTRAST:  168m OMNIPAQUE IOHEXOL 300 MG/ML  SOLN FINDINGS: Bones/Joint/Cartilage Prior great toe amputation. There is cortical irregularity about the distal first metatarsal head possible small erosions. No other findings suspicious for osteomyelitis of the foot. Generalized bony demineralization. There are hammertoe deformity of the digits. No acute fracture. Well corticated density adjacent to the anterior talus may represent sequela of remote prior injury. Minor fragmentation adjacent to the anterior calcaneus is chronic. Ligaments Suboptimally assessed by CT. Muscles and Tendons No intramuscular fluid collection. Mild fatty atrophy of the intrinsic musculature of the foot. Soft tissues Soft tissue thickening adjacent to the first metatarsal head at the operative bed with associated skin thickening. No focal fluid  collection or abscess. Mild generalized subcutaneous soft tissue edema. IMPRESSION: 1. Post great toe amputation. Cortical irregularity about the distal first metatarsal head with possible small erosions, suspicious for osteomyelitis. Adjacent soft tissue thickening with associated skin thickening. No focal fluid collection or abscess. 2. No other sites concerning for osteomyelitis. Electronically Signed   By: Keith Rake M.D.   On: 01/12/2020 00:56   DG Foot Complete Left  Result Date: 01/11/2020 CLINICAL DATA:  Foot infection. Toe amputation 10 months ago. Oozing and bleeding from the amputation site. EXAM: LEFT FOOT - COMPLETE 3+ VIEW COMPARISON:  01/09/2019 FINDINGS: Amputation of the great toe at the metatarsal phalangeal joint. Some degree of rare fraction of the distal metatarsal, which could be due to disuse osteopenia. No clear destructive focus to allow diagnosis of osteomyelitis. The other toes appear normal. The more proximal foot appears normal. IMPRESSION: Amputation of the great toe at the MTP joint. Some rare fraction of the distal aspect of  the MTP, but without focal destruction. Cannot definitely establish osteomyelitis based on this appearance. Electronically Signed   By: Nelson Chimes M.D.   On: 01/11/2020 22:04    Microbiology: Recent Results (from the past 240 hour(s))  Blood culture (routine x 2)     Status: None   Collection Time: 01/11/20  8:39 PM   Specimen: BLOOD  Result Value Ref Range Status   Specimen Description BLOOD SITE NOT SPECIFIED  Final   Special Requests   Final    BOTTLES DRAWN AEROBIC AND ANAEROBIC Blood Culture results may not be optimal due to an inadequate volume of blood received in culture bottles   Culture   Final    NO GROWTH 5 DAYS Performed at [facility], [place] [address] [place], [place] [address]    Report Status 01/16/2020 FINAL  Final  Blood culture (routine x 2)     Status: None   Collection Time: 01/11/20 10:30 PM   Specimen: BLOOD  Result Value Ref Range Status   Specimen Description BLOOD SITE NOT SPECIFIED  Final   Special Requests AEROBIC BOTTLE ONLY Blood Culture adequate volume  Final   Culture   Final    NO GROWTH 5 DAYS Performed at [facility], [place] [address], [place], [place] [address]    Report Status 01/17/2020 FINAL  Final  SARS CORONAVIRUS 2 (TAT 6-24 HRS) Nasopharyngeal Nasopharyngeal Swab     Status: None   Collection Time: 01/12/20 12:47 AM   Specimen: Nasopharyngeal Swab  Result Value Ref Range Status   SARS Coronavirus 2 NEGATIVE NEGATIVE Final    Comment: (NOTE) SARS-CoV-2 target nucleic acids are NOT DETECTED. The SARS-CoV-2 RNA is generally detectable in upper and lower respiratory specimens during the acute phase of infection. Negative results do not preclude SARS-CoV-2 infection, do not rule out co-infections with other pathogens, and should not be used as the sole basis for treatment or other patient management decisions. Negative results must be combined with clinical observations, patient history, and epidemiological information. The  expected result is Negative. Fact Sheet for Patients: SugarRoll.be Fact Sheet for Healthcare Providers: https://www.woods-mathews.com/ This test is not yet approved or cleared by the [place] FDA and  has been authorized for detection and/or diagnosis of SARS-CoV-2 by FDA under an Emergency Use Authorization (EUA). This EUA will remain  in effect (meaning this test can be used) for the duration of the COVID-19 declaration under Section 56 4(b)(1) of the Act, 21 U.S.C. section 360bbb-3(b)(1), unless the  authorization is terminated or revoked sooner. Performed at [facility], [place] [address] [place], [place] [address]   Surgical pcr screen     Status: None   Collection Time: 01/15/20 11:50 AM   Specimen: Nasal Mucosa; Nasal Swab  Result Value Ref Range Status   MRSA, PCR NEGATIVE NEGATIVE Final   Staphylococcus aureus NEGATIVE NEGATIVE Final    Comment: (NOTE) The Xpert SA Assay (FDA approved for NASAL specimens in patients 55 years of age and older), is one component of a comprehensive surveillance program. It is not intended to diagnose infection nor to guide or monitor treatment. Performed at [facility], [place] [address], [place], [place] [address]      Labs: Basic Metabolic Panel: Recent Labs  Lab 01/12/20 [phone] 01/12/20 0208 01/13/20 0436 01/13/20 0436 01/14/20 1202 01/15/20 0416 01/15/20 0856 01/16/20 0445 01/17/20 0432  NA 136   < > 138  --   --  135 136 137 134*  K 4.6   < > 4.3   < > 4.1 3.8 4.0 3.6 4.1  CL 101   < > 103  --   --  104 107 107 102  CO2 23   < > 24  --   --  21* 21* 23 22  GLUCOSE 102*   < > 99  --   --  105* 103* 108* 148*  BUN 5*   < > 12  --   --  '10 8 7 8  '$ CREATININE 0.90   < > 0.94  --   --  0.85 0.80 0.83 0.79  CALCIUM 8.5*   < > 8.4*  --   --  8.7* 8.5* 8.5* 8.7*  MG 1.4*  --   --   --  1.5* 1.3*  --  1.7  --   PHOS 3.8  --   --   --   --  3.3  --   --   --    < > = values in  this interval not displayed.   Liver Function Tests: Recent Labs  Lab 01/11/20 2230 01/12/20 0208  AST 105* 92*  ALT 79* 73*  ALKPHOS 57 55  BILITOT 0.8 1.3*  PROT 6.6 6.4*  ALBUMIN 3.4* 3.3*   No results for input(s): LIPASE, AMYLASE in the last 168 hours. No results for input(s): AMMONIA in the last 168 hours. CBC: Recent Labs  Lab 01/11/20 2230 01/12/20 0208 01/13/20 0436 01/14/20 0213 01/15/20 0416 01/16/20 0445 01/17/20 0432  WBC 4.3   < > 3.5* 2.8* 2.9* 2.5* 4.6  NEUTROABS 3.4  --   --   --   --   --  3.9  HGB 13.6   < > 11.1* 10.6* 10.2* 10.5* 10.8*  HCT 41.0   < > 34.3* 33.2* 30.8* 31.6* 32.7*  MCV 83.0   < > 85.1 85.3 83.0 82.7 83.4  PLT 209   < > 178 166 172 169 188   < > = values in this interval not displayed.   Cardiac Enzymes: No results for input(s): CKTOTAL, CKMB, CKMBINDEX, TROPONINI in the last 168 hours. BNP: BNP (last 3 results) No results for input(s): BNP in the last 8760 hours.  ProBNP (last 3 results) No results for input(s): PROBNP in the last 8760 hours.  CBG: No results for input(s): GLUCAP in the last 168 hours.     Signed:  Nita Sells MD   [facility] 01/18/2020, 10:24 AM

## 2020-01-18 NOTE — Progress Notes (Signed)
Occupational Therapy Treatment Patient Details Name: Leonard Bentley MRN: [id number] DOB: [birth year] Today's Date: 01/18/2020    History of present illness Pt is a 59 y/o male s/p L 1st metatarsal and 2nd toe amputations. PMH including but not limited to CHF, lymphoma s/p chemotherapy, depression, anxiety and ETOH abuse.    OT comments  Pt progressing towards OT goals this session focused on toilet transfer with RW and maintaining TDWB (Pt currently unable to do this), Pt able to perform grooming in seated position with set up. Pt mod A for LB dressing and Pt educated in post-op shoe. Educated Pt on doing ADL in safer positions at home - seated is essential at this time. Continue to recommend [facility]-acute to maximize safety and independence in ADL and functional transfers.    Follow Up Recommendations  Home health OT;Supervision - Intermittent    Equipment Recommendations  3 in 1 bedside commode    Recommendations for Other Services      Precautions / Restrictions Precautions Precautions: Other (comment) Precaution Comments: wound VAC L foot Required Braces or Orthoses: Other Brace Other Brace: post-op shoe Restrictions Weight Bearing Restrictions: Yes LLE Weight Bearing: Touchdown weight bearing Other Position/Activity Restrictions: post op shoe       Mobility Bed Mobility Overal bed mobility: Needs Assistance Bed Mobility: Supine to Sit     Supine to sit: Supervision     General bed mobility comments: Needs help managing wound vac   Transfers Overall transfer level: Needs assistance Equipment used: Rolling walker (2 wheeled) Transfers: Sit to/from Stand Sit to Stand: Min guard;Min assist         General transfer comment: vc for WB throughout session and Pt unable to maintain with transfers and bathroom mobility    Balance Overall balance assessment: Needs assistance Sitting-balance support: Feet supported Sitting balance-Leahy Scale: Good     Standing  balance support: Bilateral upper extremity supported;During functional activity Standing balance-Leahy Scale: Poor Standing balance comment: unable to maintain TDWB without BUE support                           ADL either performed or assessed with clinical judgement   ADL Overall ADL's : Needs assistance/impaired     Grooming: Set up;Oral care;Wash/dry hands;Wash/dry face;Sitting Grooming Details (indicate cue type and reason): Pt unable to maintain standing grooming and TDWB at this time.             Lower Body Dressing: Moderate assistance;Sitting/lateral leans Lower Body Dressing Details (indicate cue type and reason): educated don/doff of post-op shoe Toilet Transfer: Ambulation;RW;Cueing for safety;Minimal assistance Toilet Transfer Details (indicate cue type and reason): vc for TDWB with transfers and safety with RW Toileting- Clothing Manipulation and Hygiene: Minimal assistance;Sit to/from stand Toileting - Clothing Manipulation Details (indicate cue type and reason): use of grab bars     Functional mobility during ADLs: Minimal assistance;Rolling walker;Cueing for safety;Cueing for sequencing General ADL Comments: cues for safety and WB. Pt anxious about being able to perform tasks at home, navigating home and bathroom     Vision       Perception     Praxis      Cognition Arousal/Alertness: Awake/alert Behavior During Therapy: [facility] for tasks assessed/performed Overall Cognitive Status: Within Functional Limits for tasks assessed  Exercises     Shoulder Instructions       General Comments      Pertinent Vitals/ Pain       Pain Assessment: 0-10 Pain Score: 7  Pain Location: L foot Pain Descriptors / Indicators: Discomfort;Sore;Grimacing Pain Intervention(s): Monitored during session;Repositioned  Home Living                                          Prior  Functioning/Environment              Frequency  Min 2X/week        Progress Toward Goals  OT Goals(current goals can now be found in the care plan section)  Progress towards OT goals: Progressing toward goals  Acute Rehab OT Goals Patient Stated Goal: to be able to run again OT Goal Formulation: With patient Time For Goal Achievement: 01/31/20 Potential to Achieve Goals: Good  Plan Discharge plan remains appropriate;Frequency remains appropriate    Co-evaluation                 AM-PAC OT "6 Clicks" Daily Activity     Outcome Measure   Help from another person eating meals?: None Help from another person taking care of personal grooming?: None(in seated position) Help from another person toileting, which includes using toliet, bedpan, or urinal?: A Little Help from another person bathing (including washing, rinsing, drying)?: A Lot Help from another person to put on and taking off regular upper body clothing?: None Help from another person to put on and taking off regular lower body clothing?: A Lot 6 Click Score: 19    End of Session Equipment Utilized During Treatment: Gait belt;Rolling walker  OT Visit Diagnosis: Unsteadiness on feet (R26.81);Other abnormalities of gait and mobility (R26.89);Pain Pain - Right/Left: Left Pain - part of body: Ankle and joints of foot   Activity Tolerance Patient tolerated treatment well   Patient Left in chair;with call bell/phone within reach;with chair alarm set   Nurse Communication Mobility status        Time: [id number] OT Time Calculation (min): 32 min  Charges: OT General Charges $OT Visit: 1 Visit OT Treatments $Self Care/Home Management : 23-37 mins  Jesse Sans OTR/L Acute Rehabilitation Services Pager: [phone] Office: [place] 01/18/2020, 1:49 PM

## 2020-01-18 NOTE — Plan of Care (Signed)

## 2020-01-18 NOTE — Progress Notes (Signed)
Pt reviewed AVS with RN and all questions answered to satisfaction. Pt medications delivered to room and DME delivered. Pt called ride and is waiting to go home with family friend, will continue to monitor until then.

## 2020-01-18 NOTE — TOC Transition Note (Signed)
Transition of Care [facility]) - CM/SW Discharge Note   Patient Details  Name: Leonard Bentley MRN: [id number] Date of Birth: 1961/05/29  [facility]) CM/SW Contact:  Curlene Labrum, RN Phone Number: 01/18/2020, 2:34 PM   Clinical Narrative:     Case Management met with the patient this morning regarding transition to home.  Patient given financial counselor's number at [facility] to followup with them regarding hospital bills, medicaid potential and disability application.  Patient instructed to call the social security office as well to get assistance with disability for the future.  The patient is having his friend pick him up in a cab to transport home.  Patient was provided with a rolling walker and refused the use of a wheelchair due to inability to mobilize at home with the wheelchair and space.  Patient offer home health services and patient states he was not willing to have PT come to the house after discharge due to condition and space in the hallways of the home.  [place] to check on availability of ordered home health services and [facility] was unable to provide services for charity.  Patient is aware and will check with Dr. Jess Barters office in a week to see if he is able to get services at this time if the patient is willing.  Patient is discharging to home with no other barriers noted.    Final next level of care: [place] Barriers to Discharge: No Barriers Identified   Patient Goals and CMS Choice Patient states their goals for this hospitalization and ongoing recovery are:: I'm anxious about my surgery today, but I'm ready to feel better.      Discharge Placement                       Discharge Plan and Services   Discharge Planning Services: CM Consult                                 Social Determinants of Health (SDOH) Interventions     Readmission Risk Interventions Readmission Risk  Prevention Plan 01/16/2020 01/18/2019  Transportation Screening Complete Complete  PCP or Specialist Appt within 5-7 Days Complete -  Home Care Screening Complete -  Medication Review (RN CM) Complete -  Medication Review ([place]) - Complete  [place]RI or [place] - Complete  [place] - Not Applicable  Some recent data might be hidden

## 2020-01-18 NOTE — Progress Notes (Signed)
PT Progress Note for Charges    01/18/20 1050  PT Visit Information  Last PT Received On 01/18/20  PT General Charges  $$ ACUTE PT VISIT 1 Visit  PT Treatments  $Gait Training 8-22 mins  $Therapeutic Activity 23-37 mins  Anastasio Champion, DPT  Acute Rehabilitation Services Pager [phone] Office [phone]

## 2020-01-21 ENCOUNTER — Telehealth: Payer: Self-pay

## 2020-01-21 ENCOUNTER — Telehealth: Payer: Self-pay | Admitting: Orthopedic Surgery

## 2020-01-21 ENCOUNTER — Telehealth: Payer: Self-pay | Admitting: Radiology

## 2020-01-21 NOTE — Telephone Encounter (Signed)
Patient called  He has a question regarding which medicine he is supposed to be taking.   Call back: [phone]

## 2020-01-21 NOTE — Telephone Encounter (Signed)
FYI----Patient called and lmom the triage line and states that when he is active he notices a dark colored fluid coming thru the tubing. I called him back and advised that is the wound vac working by pulling the fliud away from the wound to allow it to heal, I advised him to try to stay off it as much as he can till he comes in on Wednesday

## 2020-01-21 NOTE — Telephone Encounter (Signed)
Transition Care Management Follow-up Telephone Call  He said that he was waiting for a bus and would need to call this CM back tomorrow. Unable to complete the call.    Date of discharge and from where: 01/18/2020, [facility]   How have you been since you were released from the hospital? Didn't answer.   Any questions or concerns? He said that he had all of his questions answered earlier today.   Items Reviewed:  Did the pt receive and understand the discharge instructions provided?   yes  Medications obtained and verified? he said that he has his medications.  He has been taking warfarin 5 mg daily and he said that he has been administering the lovenox injections but missed yesterday's dose. He understands that he needs to have an INR check this week.  He said that he can't come until Wed 01/23/2020 or Thurs 01/24/2020 in the afternoon and he will need to call back tomorrow to schedule as he could not talk now.   Any new allergies since your discharge?   None reported   Do you have support at home?  not addressed  Other (ie: DME, Home Health, etc)  Has wound vac left foot. No home health ordered at discharge.   Functional Questionnaire: (I = Independent and D = Dependent) ADL's: not addressed as patient needed to end the call.   Follow up appointments reviewed:    PCP Hospital f/u appt confirmed? patient needs to schedule appointment with PCP  [facility] f/u appt confirmed? .has appointment with orthopedics 01/23/2020  Are transportation arrangements needed?  he may need transportation.   If their condition worsens, is the pt aware to call  their PCP or go to the ED? yes  Was the patient provided with contact information for the PCP's office or ED?   He has the phone number for the clinic  Was the pt encouraged to call back with questions or concerns?  yes he was instructed to call back tomorrow when he had time to talk and schedule follow up appointments with  PCP and [facility] for INR check.

## 2020-01-21 NOTE — Telephone Encounter (Signed)
Noted  

## 2020-01-21 NOTE — Telephone Encounter (Signed)
Pt is s/p a 1st ray amputation and second toe amputation on 01/16/20. He asked if he should be taking Lovenox and coumadin. I advised that we do not monitor or dose these medications usually Lovenox is used as a bridge prior to surgery and then resumption of the coumadin after but this would need to be adjusted by the prescribing doctor and he should call community health and wellness for instructions.

## 2020-01-22 NOTE — Telephone Encounter (Signed)
Call back not received from the patient.  This CM placed call to patient 3 [phone]  to schedule PCP follow up appointment and INR check.  He has been scheduled for INR check tomorrow  -01/23/2020 with Lurena Joiner, [facility] as he said he could make an appointment in the afternoon 01/23/2020 or 01/24/2020  Message left with call back requested to this CM

## 2020-01-23 ENCOUNTER — Encounter: Payer: Self-pay | Admitting: Family

## 2020-01-23 ENCOUNTER — Telehealth: Payer: Self-pay | Admitting: Licensed Clinical Social Worker

## 2020-01-23 ENCOUNTER — Telehealth: Payer: Self-pay

## 2020-01-23 ENCOUNTER — Other Ambulatory Visit: Payer: Self-pay

## 2020-01-23 ENCOUNTER — Ambulatory Visit: Payer: Self-pay | Admitting: Pharmacist

## 2020-01-23 ENCOUNTER — Ambulatory Visit: Payer: Self-pay | Attending: Nurse Practitioner | Admitting: Pharmacist

## 2020-01-23 ENCOUNTER — Ambulatory Visit (INDEPENDENT_AMBULATORY_CARE_PROVIDER_SITE_OTHER): Payer: Self-pay | Admitting: Family

## 2020-01-23 VITALS — Ht 69.0 in | Wt 225.0 lb

## 2020-01-23 DIAGNOSIS — L97519 Non-pressure chronic ulcer of other part of right foot with unspecified severity: Secondary | ICD-10-CM

## 2020-01-23 DIAGNOSIS — Z9889 Other specified postprocedural states: Secondary | ICD-10-CM

## 2020-01-23 DIAGNOSIS — Z86711 Personal history of pulmonary embolism: Secondary | ICD-10-CM

## 2020-01-23 DIAGNOSIS — Z89412 Acquired absence of left great toe: Secondary | ICD-10-CM

## 2020-01-23 LAB — POCT INR: INR: 1.9 — AB (ref 2.0–3.0)

## 2020-01-23 NOTE — Telephone Encounter (Signed)
Call placed to patient to confirm an appointment for INR check.  He said that he does not think that he can come today.  He is working on transportation to his appointment with Dr [name] this afternoon. He requested an appointment with Lurena Joiner, [facility]  for Friday and an appt was scheduled for 01/25/2020 @ 1530.   Explained to him that if there is any way that he can come today, Lurena Joiner, [facility] will be able to see him for the INR check  He said that he continues to take the warfarin and lovenox as ordered. He denied any signs of bleeding. He also noted that the [facility] is coming off in 1 [place]. He has been using the walker with ambulation.  He also scheduled an appointment with Ms Raul Del, NP for 3/24/20921 @ 1050. No questions/concerns at this time   Currently uninsured and no income. Explained that he will need to meet with the [facility] Financial Counselor to apply for [employer] and [employer]  if he has not yet applied for medicaid.

## 2020-01-23 NOTE — Telephone Encounter (Signed)
Patient called back stating that he was feeling very anxious and short of breath. Denied chest pain or other symptoms.  He said that he has a lot going on regarding his health and finances and the shortness of breath may be due to this anxiety. He was in agreement to speaking with [facility] Intern.  Maddie joined the call and helped him explore the issues causing his anxiety and instructed him regarding deep breathing exercises.  He said that he was feeling better by the end of the call.   He was strongly encouraged to attend his appointment with orthopedics this afternoon.  He said that he would have to arrange cab transportation.  Also explained to him that if he goes to that appointment he can come to [facility] and [place], [facility] can check his INR.  He said that he would try, to come.  If not coming to [facility] today, he has an appointment for 01/25/2020 to have INR check.  This CM also explained to him that he should not hesitate to seek care in the ED if he feels it's warranted and he said that he understood.    The patient came to the clinic this afternoon for INR check. He was pleased that he came to this  clinic as well as his orthopedics appointment earlier. Provided him with [employer] application and instructed him to complete it and bring it back to this CM next week when he comes to his PCP appointment.

## 2020-01-23 NOTE — Telephone Encounter (Signed)
This MSW Intern spoke with patient alongside RN Case Manager, Eden Lathe regarding patient's self-reported symptoms of anxiety marked by shortness of breath, feeling overwhelmed with responsibilities, and fear that his recent toe amputation will result in "losing my foot". Patient identified shortness of breath as a symptom of anxiety. Patient was encouraged to report to the ED if he feels he needs medical attention.   This MSW Intern and patient practiced a deep breathing exercise together to decrease feelings of anxiety. Patient reports he drank 1/2 bottle of wine earlier this morning. Patient states he drinks 1 bottle of wine per [place] for the past 2 years. Patient inquired about alcohol detox options. Patient was encouraged to present to [facility]  This MSW Intern encouraged patient to attend his medical appointment today and to practice deep breathing in the case that anxiety symptoms resurface. This MSW Intern will place a follow up call to patient to further inquire about patient's stated interest in alcohol detox.   Idamae Lusher MSW Intern

## 2020-01-23 NOTE — Progress Notes (Signed)
Post-Op Visit Note   Patient: Leonard Bentley           Date of Birth: [birth year]           MRN: [id number] Visit Date: 01/23/2020 PCP: [name], NP  Chief Complaint:  Chief Complaint  Patient presents with  . Left Foot - Routine Post Op    01/16/20 left foot 1st MT and 2nd toe amputation     HPI:  HPI Is a 59 year old gentleman who presents today 1 week status post left first and second toe amputations with first ray amputation he has been attempting to minimize his weightbearing around his home.  Wound VAC removed today.  He is also concerned for some black ulcerated areas to his right great toe  States has not had a recent ABI Ortho Exam On examination of bilateral lower extremities he does have a palpable dorsalis pedis pulse.  The left first ray and second toe amputation incision is well approximated sutures there is no gaping no active drainage there is some maceration surrounding there is no erythema no sign of infection.  To his right great toe he has high distal ulcer this is the size of a quarter to the tuft this is not open there is no drainage no surrounding erythema no swelling.  Is tender.  Visit Diagnoses:  1. Status post amputation of left great toe ([place])   2. Ischemic toe ulcer, right, with unspecified severity ([place])     Plan: We will send for ABIs of the lower extremities.  He will begin with daily dose of cleansing.  Dry dressing changes.  Given wound care supplies he will follow-up in 2 weeks for surgical incision check minimize weightbearing.  Follow-Up Instructions: Return in about 2 weeks (around 02/06/2020).   Imaging: No results found.  Orders:  Orders Placed This Encounter  Procedures  . VAS [place] ABI WITH/WO TBI   No orders of the defined types were placed in this encounter.    PMFS History: Patient Active Problem List   Diagnosis Date Noted  . Cellulitis of left foot   . Left foot infection 01/11/2020  . Chronic combined systolic and  diastolic heart failure ([place]) 03/06/2019  . Coagulation disorder ([place]) 03/06/2019  . Impacted cerumen of both ears 02/09/2019  . Osteomyelitis of great toe of left foot ([place]) 01/09/2019  . Foot ulcer, left ([place]) 12/27/2018  . Cellulitis 12/07/2018  . Bleeding from left ear 11/28/2018  . HTN (hypertension) 11/02/2018  . UTI (urinary tract infection) 11/02/2018  . Ecchymosis   . Tachyarrhythmia 08/30/2018  . Hypomagnesemia 08/30/2018  . Recurrent pulmonary emboli ([place])   . Hematuria   . Delirium tremens ([place])   . LFTs abnormal   . Alcoholic hepatitis without ascites   . Alcohol withdrawal ([place]) 06/04/2018  . Dizziness   . Posterior tibial tendon dysfunction 02/21/2018  . Supratherapeutic INR 11/27/2016  . Dehydration 11/27/2016  . SVT (supraventricular tachycardia) ([place])   . S/P minimally-invasive mitral valve repair 10/27/2016  . History of pulmonary embolus (PE) 04/11/2016  . Left sided numbness   . Anxiety 04/02/2016  . Panic attacks 04/02/2016  . Mitral valve prolapse   . Mitral regurgitation 01/20/2016  . Atrial tachycardia ([place]) 01/06/2016  . Murmur 01/06/2016  . Mobitz I 10/09/2015  . PAC (premature atrial contraction) 10/09/2015  . Arthritis 08/14/2015  . Neuropathy due to chemotherapeutic drug ([place]) 03/09/2013  . Leucopenia 01/31/2013  . Personal history of pulmonary embolism 12/28/2012  .  Alcohol abuse 08/24/2012  . Cigarette smoker 08/24/2012  . Hemorrhoid 03/08/2012  . Anemia 12/10/2011  . History of non-Hodgkin's lymphoma 09/25/2011   Past Medical History:  Diagnosis Date  . Acute pulmonary embolism ([place]) 04/11/2016  . Anxiety   . Arthritis   . Atrial tachycardia ([place]) 01/06/2016  . Depression   . DTs (delirium tremens) ([place])   . Dyspnea   . ED (erectile dysfunction)   . ETOH abuse   . Hypertension   . Lymphoma, non Hodgkin's 09/25/2011   Stage 2  . Mitral regurgitation 01/20/2016  . Mobitz I 10/09/2015  . Murmur 01/06/2016  . Occasional tremors   .  PAC (premature atrial contraction) 10/09/2015  . S/P minimally-invasive mitral valve repair 10/27/2016   Complex valvuloplasty including artificial Gore-tex neochord placement x6 and 34 mm Edwards Physio II ring annuloplasty via right mini thoracotomy approach    Family History  Problem Relation Age of Onset  . Cancer Mother        BREAST(BONE)  . Cancer Father        PANCREATIC  . Hypertension Maternal Grandmother   . Stroke Maternal Aunt   . Heart attack Neg Hx     Past Surgical History:  Procedure Laterality Date  . AMPUTATION Left 01/12/2019   Procedure: LEFT GREAT TOE AMPUTATION;  Surgeon: [name], MD;  Location: [place];  Service: Orthopedics;  Laterality: Left;  . AMPUTATION Left 01/16/2020   Procedure: LEFT 1ST METATARSAL AND SECOND TOE AMPUTATION;  Surgeon: [name], MD;  Location: [place];  Service: Orthopedics;  Laterality: Left;  . CARDIAC CATHETERIZATION N/A 08/13/2016   Procedure: Right/Left Heart Cath and Coronary Angiography;  Surgeon: [name], MD;  Location: [name] CV [facility];  Service: Cardiovascular;  Laterality: N/A;  . MITRAL VALVE REPAIR Right 10/27/2016   Procedure: MINIMALLY INVASIVE MITRAL VALVE REPAIR (MVR) USING 34 PHYSIO II ANNULOPLASTY RING;  Surgeon: [name], MD;  Location: [place];  Service: Open Heart Surgery;  Laterality: Right;  . SKIN BIOPSY Right 04/04/2018   right mid medial anterior tibial shave  see report in chart  . TEE WITHOUT CARDIOVERSION N/A 02/11/2016   Procedure: TRANSESOPHAGEAL ECHOCARDIOGRAM (TEE);  Surgeon: [name], MD;  Location: [place];  Service: Cardiovascular;  Laterality: N/A;  . TEE WITHOUT CARDIOVERSION N/A 10/27/2016   Procedure: TRANSESOPHAGEAL ECHOCARDIOGRAM (TEE);  Surgeon: [name], MD;  Location: [place];  Service: Open Heart Surgery;  Laterality: N/A;  . TRIGGER FINGER RELEASE Bilateral    Social History   Occupational History  . Occupation: unemployed  Tobacco Use  . Smoking  status: Current Every [place] Smoker    Packs/[place]: 1.00    Years: 40.00    Pack years: 40.00    Types: Cigarettes, E-cigarettes  . Smokeless tobacco: Former Systems developer    Quit date: 1985  . Tobacco comment: vapes all [place]  Substance and Sexual Activity  . Alcohol use: Yes    Alcohol/week: 0.0 standard drinks    Comment: 1-2 bottles of red wine a [place]  . Drug use: No  . Sexual activity: Not Currently

## 2020-01-24 ENCOUNTER — Other Ambulatory Visit: Payer: Self-pay | Admitting: Orthopedic Surgery

## 2020-01-24 ENCOUNTER — Telehealth: Payer: Self-pay | Admitting: Family

## 2020-01-24 MED ORDER — OXYCODONE HCL 5 MG PO TABS: 5.0000 mg | ORAL_TABLET | Freq: Four times a day (QID) | ORAL | 0 refills | Status: DC | PRN

## 2020-01-24 NOTE — Telephone Encounter (Signed)
Rx sent to [facility], ABI was ordered yesterday, his pulse at the ankle was good preoperative, will be good to have baseline values

## 2020-01-24 NOTE — Telephone Encounter (Signed)
Patient called and stated that he was to have a referral to Vascular Studies. Patient wants to know if this was done.  Please call patient to advise.  [phone]

## 2020-01-24 NOTE — Telephone Encounter (Signed)
Message to Dr. [name] for review.

## 2020-01-24 NOTE — Telephone Encounter (Signed)
Patient called and requested a refill of Hydrocodone.  Please call patient to advise. [phone]  [place]

## 2020-01-24 NOTE — Telephone Encounter (Signed)
Noted  

## 2020-01-24 NOTE — Telephone Encounter (Signed)
I called pt to advise that Leonard Bentley had put an order in for vascular studies yesterday and that they will call and offer an appt to him. I spoke with Leonard Bentley and he said that the appt could be scheduled out a few weeks and that this was ok. He had good pulses at the ankle prior to surgery and he felt that the blood flow was ok. Pt voiced understanding of this. Also advised that his rx had been sent to the pharm and to call the office with any questions.

## 2020-01-24 NOTE — Telephone Encounter (Signed)
Pt is s/p a left foot 1st MT and second toe amputation 01/16/20. He is wanting a referral to vascular to have ABI studies please advise. He is also requesting a refill on his Oxycodone 5 mg last refill 01/17/20 #24

## 2020-01-25 ENCOUNTER — Ambulatory Visit: Payer: Self-pay | Admitting: Pharmacist

## 2020-01-25 ENCOUNTER — Telehealth: Payer: Self-pay | Admitting: Licensed Clinical Social Worker

## 2020-01-25 NOTE — Telephone Encounter (Signed)
This MSW Intern placed call to patient to follow up from telephone encounter 01/23/20. Patient states he has been feeling better the past 2 days. Patient states he remains interested in alcohol detox. This MSW Intern will follow up with patient at upcoming appointment with NP Raul Del 01/30/20 for a joint visit. Patient denies suicidal ideation. This MSW Intern provided patient with crisis resources.

## 2020-01-27 ENCOUNTER — Encounter (HOSPITAL_COMMUNITY): Payer: Self-pay | Admitting: Emergency Medicine

## 2020-01-27 ENCOUNTER — Emergency Department (HOSPITAL_COMMUNITY): Payer: Medicaid Other

## 2020-01-27 ENCOUNTER — Inpatient Hospital Stay (HOSPITAL_COMMUNITY)
Admission: EM | Admit: 2020-01-27 | Discharge: 2020-02-25 | DRG: 474 | Disposition: A | Discharge status: Home / Self Care | Payer: Medicaid Other | Attending: Internal Medicine | Admitting: Internal Medicine

## 2020-01-27 ENCOUNTER — Other Ambulatory Visit: Payer: Self-pay

## 2020-01-27 DIAGNOSIS — T8781 Dehiscence of amputation stump: Secondary | ICD-10-CM | POA: Diagnosis present

## 2020-01-27 DIAGNOSIS — E872 Acidosis: Secondary | ICD-10-CM | POA: Diagnosis present

## 2020-01-27 DIAGNOSIS — Z9119 Patient's noncompliance with other medical treatment and regimen: Secondary | ICD-10-CM | POA: Diagnosis not present

## 2020-01-27 DIAGNOSIS — F1010 Alcohol abuse, uncomplicated: Secondary | ICD-10-CM | POA: Diagnosis present

## 2020-01-27 DIAGNOSIS — L8990 Pressure ulcer of unspecified site, unspecified stage: Secondary | ICD-10-CM | POA: Insufficient documentation

## 2020-01-27 DIAGNOSIS — D638 Anemia in other chronic diseases classified elsewhere: Secondary | ICD-10-CM | POA: Diagnosis present

## 2020-01-27 DIAGNOSIS — F17210 Nicotine dependence, cigarettes, uncomplicated: Secondary | ICD-10-CM | POA: Diagnosis present

## 2020-01-27 DIAGNOSIS — Z86711 Personal history of pulmonary embolism: Secondary | ICD-10-CM | POA: Diagnosis not present

## 2020-01-27 DIAGNOSIS — Z8249 Family history of ischemic heart disease and other diseases of the circulatory system: Secondary | ICD-10-CM

## 2020-01-27 DIAGNOSIS — L89311 Pressure ulcer of right buttock, stage 1: Secondary | ICD-10-CM | POA: Diagnosis present

## 2020-01-27 DIAGNOSIS — Z7901 Long term (current) use of anticoagulants: Secondary | ICD-10-CM

## 2020-01-27 DIAGNOSIS — A419 Sepsis, unspecified organism: Secondary | ICD-10-CM | POA: Diagnosis present

## 2020-01-27 DIAGNOSIS — D72819 Decreased white blood cell count, unspecified: Secondary | ICD-10-CM | POA: Diagnosis present

## 2020-01-27 DIAGNOSIS — M86272 Subacute osteomyelitis, left ankle and foot: Secondary | ICD-10-CM | POA: Diagnosis present

## 2020-01-27 DIAGNOSIS — T8131XA Disruption of external operation (surgical) wound, not elsewhere classified, initial encounter: Secondary | ICD-10-CM

## 2020-01-27 DIAGNOSIS — Z9889 Other specified postprocedural states: Secondary | ICD-10-CM

## 2020-01-27 DIAGNOSIS — I5042 Chronic combined systolic (congestive) and diastolic (congestive) heart failure: Secondary | ICD-10-CM | POA: Diagnosis present

## 2020-01-27 DIAGNOSIS — Z9221 Personal history of antineoplastic chemotherapy: Secondary | ICD-10-CM | POA: Diagnosis not present

## 2020-01-27 DIAGNOSIS — T8744 Infection of amputation stump, left lower extremity: Secondary | ICD-10-CM | POA: Diagnosis present

## 2020-01-27 DIAGNOSIS — C8590 Non-Hodgkin lymphoma, unspecified, unspecified site: Secondary | ICD-10-CM | POA: Diagnosis present

## 2020-01-27 DIAGNOSIS — Z20822 Contact with and (suspected) exposure to covid-19: Secondary | ICD-10-CM | POA: Diagnosis present

## 2020-01-27 DIAGNOSIS — Z823 Family history of stroke: Secondary | ICD-10-CM | POA: Diagnosis not present

## 2020-01-27 DIAGNOSIS — Z809 Family history of malignant neoplasm, unspecified: Secondary | ICD-10-CM | POA: Diagnosis not present

## 2020-01-27 DIAGNOSIS — Y835 Amputation of limb(s) as the cause of abnormal reaction of the patient, or of later complication, without mention of misadventure at the time of the procedure: Secondary | ICD-10-CM | POA: Diagnosis present

## 2020-01-27 DIAGNOSIS — I471 Supraventricular tachycardia: Secondary | ICD-10-CM

## 2020-01-27 DIAGNOSIS — M869 Osteomyelitis, unspecified: Secondary | ICD-10-CM | POA: Diagnosis present

## 2020-01-27 DIAGNOSIS — I110 Hypertensive heart disease with heart failure: Secondary | ICD-10-CM | POA: Diagnosis present

## 2020-01-27 DIAGNOSIS — M84475A Pathological fracture, left foot, initial encounter for fracture: Secondary | ICD-10-CM | POA: Diagnosis present

## 2020-01-27 DIAGNOSIS — T8789 Other complications of amputation stump: Secondary | ICD-10-CM | POA: Diagnosis present

## 2020-01-27 DIAGNOSIS — Z602 Problems related to living alone: Secondary | ICD-10-CM | POA: Diagnosis present

## 2020-01-27 LAB — CBC WITH DIFFERENTIAL/PLATELET
Abs Immature Granulocytes: 0.01 10*3/uL (ref 0.00–0.07)
Basophils Absolute: 0.1 10*3/uL (ref 0.0–0.1)
Basophils Relative: 2 %
Eosinophils Absolute: 0 10*3/uL (ref 0.0–0.5)
Eosinophils Relative: 1 %
HCT: 41.3 % (ref 39.0–52.0)
Hemoglobin: 13.3 g/dL (ref 13.0–17.0)
Immature Granulocytes: 0 %
Lymphocytes Relative: 16 %
Lymphs Abs: 0.5 10*3/uL — ABNORMAL LOW (ref 0.7–4.0)
MCH: 26.7 pg (ref 26.0–34.0)
MCHC: 32.2 g/dL (ref 30.0–36.0)
MCV: 82.9 fL (ref 80.0–100.0)
Monocytes Absolute: 0.5 10*3/uL (ref 0.1–1.0)
Monocytes Relative: 15 %
Neutro Abs: 2.1 10*3/uL (ref 1.7–7.7)
Neutrophils Relative %: 66 %
Platelets: 273 10*3/uL (ref 150–400)
RBC: 4.98 MIL/uL (ref 4.22–5.81)
RDW: 15.9 % — ABNORMAL HIGH (ref 11.5–15.5)
WBC: 3.2 10*3/uL — ABNORMAL LOW (ref 4.0–10.5)
nRBC: 1.9 % — ABNORMAL HIGH (ref 0.0–0.2)

## 2020-01-27 LAB — COMPREHENSIVE METABOLIC PANEL
ALT: 42 U/L (ref 0–44)
AST: 56 U/L — ABNORMAL HIGH (ref 15–41)
Albumin: 3.6 g/dL (ref 3.5–5.0)
Alkaline Phosphatase: 60 U/L (ref 38–126)
Anion gap: 17 — ABNORMAL HIGH (ref 5–15)
BUN: <5 mg/dL — ABNORMAL LOW (ref 6–20)
CO2: 25 mmol/L (ref 22–32)
Calcium: 9.3 mg/dL (ref 8.9–10.3)
Chloride: 94 mmol/L — ABNORMAL LOW (ref 98–111)
Creatinine, Ser: 0.85 mg/dL (ref 0.61–1.24)
GFR calc Af Amer: >60 mL/min (ref >60)
GFR calc non Af Amer: >60 mL/min (ref >60)
Glucose, Bld: 107 mg/dL — ABNORMAL HIGH (ref 70–99)
Potassium: 4.7 mmol/L (ref 3.5–5.1)
Sodium: 136 mmol/L (ref 135–145)
Total Bilirubin: 0.9 mg/dL (ref 0.3–1.2)
Total Protein: 7.1 g/dL (ref 6.5–8.1)

## 2020-01-27 LAB — LACTIC ACID, PLASMA
Lactic Acid, Venous: 4.9 mmol/L (ref 0.5–1.9)
Lactic Acid, Venous: 2.7 mmol/L (ref 0.5–1.9)

## 2020-01-27 LAB — URINALYSIS, ROUTINE W REFLEX MICROSCOPIC
Bacteria, UA: NONE SEEN
Bilirubin Urine: NEGATIVE
Glucose, UA: NEGATIVE mg/dL
Ketones, ur: 5 mg/dL — AB
Leukocytes,Ua: NEGATIVE
Nitrite: NEGATIVE
Protein, ur: 30 mg/dL — AB
Specific Gravity, Urine: 1.015 (ref 1.005–1.030)
pH: 6 (ref 5.0–8.0)

## 2020-01-27 LAB — RESPIRATORY PANEL BY RT PCR (FLU A&B, COVID)
Influenza A by PCR: NEGATIVE
Influenza B by PCR: NEGATIVE
SARS Coronavirus 2 by RT PCR: NEGATIVE

## 2020-01-27 LAB — PROTIME-INR
INR: 3.8 — ABNORMAL HIGH (ref 0.8–1.2)
Prothrombin Time: 37.3 seconds — ABNORMAL HIGH (ref 11.4–15.2)

## 2020-01-27 LAB — APTT: aPTT: 52 seconds — ABNORMAL HIGH (ref 24–36)

## 2020-01-27 LAB — TROPONIN I (HIGH SENSITIVITY)
Troponin I (High Sensitivity): 7 ng/L (ref <18)
Troponin I (High Sensitivity): 7 ng/L (ref <18)

## 2020-01-27 MED ORDER — SODIUM CHLORIDE 0.9 % IV SOLN
2.0000 g | INTRAVENOUS | Status: AC
  Administered 2020-01-28 – 2020-01-30 (×3): 2 g via INTRAVENOUS
  Filled 2020-01-27 (×3): qty 20

## 2020-01-27 MED ORDER — SODIUM CHLORIDE 0.9% FLUSH: 3.0000 mL | Freq: Once | INTRAVENOUS | Status: DC

## 2020-01-27 MED ORDER — ADULT MULTIVITAMIN W/MINERALS CH
1.0000 | ORAL_TABLET | Freq: Every day | ORAL | Status: DC
  Administered 2020-01-28 – 2020-02-25 (×28): 1 via ORAL
  Filled 2020-01-27 (×28): qty 1

## 2020-01-27 MED ORDER — THIAMINE HCL 100 MG PO TABS
100.0000 mg | ORAL_TABLET | Freq: Every day | ORAL | Status: DC
  Administered 2020-01-28 – 2020-02-25 (×28): 100 mg via ORAL
  Filled 2020-01-27 (×28): qty 1

## 2020-01-27 MED ORDER — LORAZEPAM 2 MG/ML IJ SOLN: 0.0000 mg | Freq: Four times a day (QID) | INTRAMUSCULAR | Status: DC

## 2020-01-27 MED ORDER — LORAZEPAM 2 MG/ML IJ SOLN: 0.0000 mg | Freq: Two times a day (BID) | INTRAMUSCULAR | Status: DC

## 2020-01-27 MED ORDER — SODIUM CHLORIDE 0.9 % IV BOLUS
1000.0000 mL | Freq: Once | INTRAVENOUS | Status: AC
  Administered 2020-01-27: 1000 mL via INTRAVENOUS

## 2020-01-27 MED ORDER — LORAZEPAM 1 MG PO TABS
1.0000 mg | ORAL_TABLET | ORAL | Status: AC | PRN
  Administered 2020-01-28 – 2020-01-30 (×3): 1 mg via ORAL
  Filled 2020-01-27 (×3): qty 1

## 2020-01-27 MED ORDER — VANCOMYCIN HCL IN DEXTROSE 1-5 GM/200ML-% IV SOLN
1000.0000 mg | Freq: Two times a day (BID) | INTRAVENOUS | Status: AC
  Administered 2020-01-28 – 2020-01-31 (×7): 1000 mg via INTRAVENOUS
  Filled 2020-01-27 (×7): qty 200

## 2020-01-27 MED ORDER — NICOTINE 14 MG/24HR TD PT24
14.0000 mg | MEDICATED_PATCH | Freq: Every day | TRANSDERMAL | Status: DC
  Administered 2020-01-27 – 2020-02-25 (×29): 14 mg via TRANSDERMAL
  Filled 2020-01-27 (×30): qty 1

## 2020-01-27 MED ORDER — ACETAMINOPHEN 325 MG PO TABS
650.0000 mg | ORAL_TABLET | Freq: Four times a day (QID) | ORAL | Status: DC | PRN
  Administered 2020-01-30 – 2020-02-08 (×3): 650 mg via ORAL
  Filled 2020-01-27 (×3): qty 2

## 2020-01-27 MED ORDER — THIAMINE HCL 100 MG PO TABS
100.0000 mg | ORAL_TABLET | Freq: Every day | ORAL | Status: DC
  Administered 2020-01-27: 100 mg via ORAL
  Filled 2020-01-27: qty 1

## 2020-01-27 MED ORDER — MORPHINE SULFATE (PF) 2 MG/ML IV SOLN
1.0000 mg | INTRAVENOUS | Status: DC | PRN
  Administered 2020-01-27 – 2020-02-07 (×13): 1 mg via INTRAVENOUS
  Filled 2020-01-27 (×13): qty 1

## 2020-01-27 MED ORDER — SODIUM CHLORIDE 0.9 % IV BOLUS
1500.0000 mL | Freq: Once | INTRAVENOUS | Status: AC
  Administered 2020-01-27: 1500 mL via INTRAVENOUS

## 2020-01-27 MED ORDER — LORAZEPAM 1 MG PO TABS: 0.0000 mg | ORAL_TABLET | Freq: Two times a day (BID) | ORAL | Status: DC

## 2020-01-27 MED ORDER — ALUM & MAG HYDROXIDE-SIMETH 200-200-20 MG/5ML PO SUSP
30.0000 mL | Freq: Once | ORAL | Status: AC
  Administered 2020-01-27: 30 mL via ORAL
  Filled 2020-01-27: qty 30

## 2020-01-27 MED ORDER — VANCOMYCIN HCL 2000 MG/400ML IV SOLN
2000.0000 mg | Freq: Once | INTRAVENOUS | Status: AC
  Administered 2020-01-27: 2000 mg via INTRAVENOUS
  Filled 2020-01-27: qty 400

## 2020-01-27 MED ORDER — THIAMINE HCL 100 MG/ML IJ SOLN
100.0000 mg | Freq: Every day | INTRAMUSCULAR | Status: DC
  Filled 2020-01-27: qty 2

## 2020-01-27 MED ORDER — VANCOMYCIN HCL IN DEXTROSE 1-5 GM/200ML-% IV SOLN: 1000.0000 mg | Freq: Once | INTRAVENOUS | Status: DC

## 2020-01-27 MED ORDER — ONDANSETRON HCL 4 MG PO TABS
4.0000 mg | ORAL_TABLET | Freq: Four times a day (QID) | ORAL | Status: DC | PRN
  Filled 2020-01-27: qty 1

## 2020-01-27 MED ORDER — LORAZEPAM 1 MG PO TABS
0.0000 mg | ORAL_TABLET | Freq: Four times a day (QID) | ORAL | Status: DC
  Administered 2020-01-27: 1 mg via ORAL
  Filled 2020-01-27: qty 1

## 2020-01-27 MED ORDER — LORAZEPAM 2 MG/ML IJ SOLN
1.0000 mg | INTRAMUSCULAR | Status: AC | PRN
  Administered 2020-01-28 – 2020-01-30 (×4): 1 mg via INTRAVENOUS
  Filled 2020-01-27 (×2): qty 1

## 2020-01-27 MED ORDER — ACETAMINOPHEN 650 MG RE SUPP: 650.0000 mg | Freq: Four times a day (QID) | RECTAL | Status: DC | PRN

## 2020-01-27 MED ORDER — THIAMINE HCL 100 MG/ML IJ SOLN: 100.0000 mg | Freq: Every day | INTRAMUSCULAR | Status: DC

## 2020-01-27 MED ORDER — ONDANSETRON HCL 4 MG/2ML IJ SOLN: 4.0000 mg | Freq: Four times a day (QID) | INTRAMUSCULAR | Status: DC | PRN

## 2020-01-27 MED ORDER — ONDANSETRON HCL 4 MG/2ML IJ SOLN
2.0000 mg | Freq: Once | INTRAMUSCULAR | Status: AC
  Administered 2020-01-27: 2 mg via INTRAVENOUS
  Filled 2020-01-27: qty 2

## 2020-01-27 MED ORDER — SODIUM CHLORIDE 0.9 % IV SOLN
2.0000 g | Freq: Once | INTRAVENOUS | Status: AC
  Administered 2020-01-27: 2 g via INTRAVENOUS
  Filled 2020-01-27: qty 20

## 2020-01-27 MED ORDER — FOLIC ACID 1 MG PO TABS
1.0000 mg | ORAL_TABLET | Freq: Every day | ORAL | Status: DC
  Administered 2020-01-28 – 2020-02-25 (×28): 1 mg via ORAL
  Filled 2020-01-27 (×29): qty 1

## 2020-01-27 MED ORDER — LORAZEPAM 2 MG/ML IJ SOLN
0.0000 mg | Freq: Two times a day (BID) | INTRAMUSCULAR | Status: AC
  Administered 2020-01-29: 1 mg via INTRAVENOUS
  Filled 2020-01-27: qty 1

## 2020-01-27 MED ORDER — LORAZEPAM 2 MG/ML IJ SOLN
0.0000 mg | Freq: Four times a day (QID) | INTRAMUSCULAR | Status: AC
  Administered 2020-01-27: 1 mg via INTRAVENOUS
  Filled 2020-01-27 (×4): qty 1

## 2020-01-27 NOTE — ED Notes (Signed)
Lactic Acid 4.9.  Pt going to exam room now.  Dr. Francia Greaves notified.

## 2020-01-27 NOTE — Progress Notes (Signed)
Pharmacy Antibiotic Note  Leonard Bentley is a 59 y.o. male admitted on 01/27/2020 with cellulitis.  Pharmacy has been consulted for vancomycin dosing. Pt is afebrile and WBC is low. SCr is WNL but lactic acid is elevated. Recent toe amputation.   Plan: Vancomycin 2gm IV x 1 then 1gm IV Q12H  F/u renal fxn, C&S, clinical status and peak/trough at [facility] F/u continuation of gram neg coverage    Temp (24hrs), Avg:98.1 F (36.7 C), Min:98.1 F (36.7 C), Max:98.1 F (36.7 C)  Recent Labs  Lab 01/27/20 1524  WBC 3.2*  CREATININE 0.85  LATICACIDVEN 4.9*    Estimated Creatinine Clearance: 108.9 mL/min (by C-G formula based on SCr of 0.85 mg/dL).    Allergies  Allergen Reactions  . Other Other (See Comments)    "Cactus" caused blisters on tongue (if prepared as food)    Antimicrobials this admission: Vanc 3/21>> CTX x 1 3/21  Dose adjustments this admission: N/A  Microbiology results: Pending  Thank you for allowing pharmacy to be a part of this patient's care.  Angelis Gates, Rande Lawman 01/27/2020 5:26 PM

## 2020-01-27 NOTE — ED Triage Notes (Signed)
Pt c/o bleeding to surgical site on his left foot. C/o increased pain, nausea, generalized weakness and shortness of breath. States he is taking 1-2 hydrocodone every 4-5 hours. Ambulatory with cane.

## 2020-01-27 NOTE — ED Provider Notes (Signed)
[facility] EMERGENCY DEPARTMENT Provider Note   CSN: [id number] Arrival date & time: 01/27/20  1508     History Chief Complaint  Patient presents with  . Weakness  . Shortness of Breath    Leonard Bentley is a 59 y.o. male history of alcohol abuse, hypertension, mitral valve repair, CHF, PE 2014, lymphoma.  Patient reports that he is currently on Coumadin and Xarelto.  Patient presents today for multiple concerns, primarily for pain drainage and swelling of the left foot, he underwent left first metatarsal and second toe amputation on 01/16/2020 with Dr. [name].  He reports is not currently on any antibiotics, pain and swelling has developed over the past 3 days now rated as severe constant nonradiating worsened with movement and palpation no alleviating factors, minimally improved with hydrocodone.  Patient also endorses nausea, generalized weakness and shortness of breath over the last 2 days.  He denies any specific chest pain but does feel like this difficult for him to catch it for breath after exertion, he reports compliance with both his Xarelto and Coumadin.  He denies any recent sick contacts.  Denies fever/chills, headache, cough/hemoptysis, vomiting, diarrhea, numbness/weakness, tingling or any additional concerns.  HPI     Past Medical History:  Diagnosis Date  . Acute pulmonary embolism ([place]) 04/11/2016  . Anxiety   . Arthritis   . Atrial tachycardia ([place]) 01/06/2016  . Depression   . DTs (delirium tremens) ([place])   . Dyspnea   . ED (erectile dysfunction)   . ETOH abuse   . Hypertension   . Lymphoma, non Hodgkin's 09/25/2011   Stage 2  . Mitral regurgitation 01/20/2016  . Mobitz I 10/09/2015  . Murmur 01/06/2016  . Occasional tremors   . PAC (premature atrial contraction) 10/09/2015  . S/P minimally-invasive mitral valve repair 10/27/2016   Complex valvuloplasty including artificial Gore-tex neochord placement x6 and 34 mm Edwards Physio II ring  annuloplasty via right mini thoracotomy approach    Patient Active Problem List   Diagnosis Date Noted  . Osteomyelitis ([place]) 01/27/2020  . Cellulitis of left foot   . Left foot infection 01/11/2020  . Chronic combined systolic and diastolic heart failure ([place]) 03/06/2019  . Coagulation disorder ([place]) 03/06/2019  . Impacted cerumen of both ears 02/09/2019  . Osteomyelitis of great toe of left foot ([place]) 01/09/2019  . Foot ulcer, left ([place]) 12/27/2018  . Cellulitis 12/07/2018  . Bleeding from left ear 11/28/2018  . HTN (hypertension) 11/02/2018  . UTI (urinary tract infection) 11/02/2018  . Ecchymosis   . Tachyarrhythmia 08/30/2018  . Hypomagnesemia 08/30/2018  . Recurrent pulmonary emboli ([place])   . Hematuria   . Delirium tremens ([place])   . LFTs abnormal   . Alcoholic hepatitis without ascites   . Alcohol withdrawal ([place]) 06/04/2018  . Dizziness   . Posterior tibial tendon dysfunction 02/21/2018  . Supratherapeutic INR 11/27/2016  . Dehydration 11/27/2016  . SVT (supraventricular tachycardia) ([place])   . S/P minimally-invasive mitral valve repair 10/27/2016  . History of pulmonary embolus (PE) 04/11/2016  . Left sided numbness   . Anxiety 04/02/2016  . Panic attacks 04/02/2016  . Mitral valve prolapse   . Mitral regurgitation 01/20/2016  . Atrial tachycardia ([place]) 01/06/2016  . Murmur 01/06/2016  . Mobitz I 10/09/2015  . PAC (premature atrial contraction) 10/09/2015  . Arthritis 08/14/2015  . Neuropathy due to chemotherapeutic drug ([place]) 03/09/2013  . Leucopenia 01/31/2013  . Personal history of pulmonary embolism 12/28/2012  . Alcohol  abuse 08/24/2012  . Cigarette smoker 08/24/2012  . Hemorrhoid 03/08/2012  . Anemia 12/10/2011  . History of non-Hodgkin's lymphoma 09/25/2011    Past Surgical History:  Procedure Laterality Date  . AMPUTATION Left 01/12/2019   Procedure: LEFT GREAT TOE AMPUTATION;  Surgeon: [name], MD;  Location: [facility];  Service: Orthopedics;   Laterality: Left;  . AMPUTATION Left 01/16/2020   Procedure: LEFT 1ST METATARSAL AND SECOND TOE AMPUTATION;  Surgeon: [name], MD;  Location: [facility];  Service: Orthopedics;  Laterality: Left;  . CARDIAC CATHETERIZATION N/A 08/13/2016   Procedure: Right/Left Heart Cath and Coronary Angiography;  Surgeon: [name], MD;  Location: [facility];  Service: Cardiovascular;  Laterality: N/A;  . MITRAL VALVE REPAIR Right 10/27/2016   Procedure: MINIMALLY INVASIVE MITRAL VALVE REPAIR (MVR) USING 34 PHYSIO II ANNULOPLASTY RING;  Surgeon: [name], MD;  Location: [facility];  Service: Open Heart Surgery;  Laterality: Right;  . SKIN BIOPSY Right 04/04/2018   right mid medial anterior tibial shave  see report in chart  . TEE WITHOUT CARDIOVERSION N/A 02/11/2016   Procedure: TRANSESOPHAGEAL ECHOCARDIOGRAM (TEE);  Surgeon: [name], MD;  Location: [facility];  Service: Cardiovascular;  Laterality: N/A;  . TEE WITHOUT CARDIOVERSION N/A 10/27/2016   Procedure: TRANSESOPHAGEAL ECHOCARDIOGRAM (TEE);  Surgeon: [name], MD;  Location: [facility];  Service: Open Heart Surgery;  Laterality: N/A;  . TRIGGER FINGER RELEASE Bilateral        Family History  Problem Relation Age of Onset  . Cancer Mother        BREAST(BONE)  . Cancer Father        PANCREATIC  . Hypertension Maternal Grandmother   . Stroke Maternal Aunt   . Heart attack Neg Hx     Social History   Tobacco Use  . Smoking status: Current Every Maack Smoker    Packs/Fitzgerald: 1.00    Years: 40.00    Pack years: 40.00    Types: Cigarettes, E-cigarettes  . Smokeless tobacco: Former Systems developer    Quit date: 1985  . Tobacco comment: vapes all Antosh  Substance Use Topics  . Alcohol use: Yes    Alcohol/week: 0.0 standard drinks    Comment: 1-2 bottles of red wine a [place]  . Drug use: No    Home Medications Prior to Admission medications   Medication Sig Start Date End Date Taking? Authorizing Provider  enoxaparin  (LOVENOX) 150 MG/ML injection Inject 1 mL (150 mg total) into the skin daily for 7 doses. 01/17/20 01/27/20 Yes Nita Sells, MD  metoprolol succinate (TOPROL-XL) 50 MG 24 hr tablet Take 1 tablet (50 mg total) by mouth daily. Take with or immediately following a meal. Patient taking differently: Take 50 mg by mouth at bedtime. Take with or immediately following a meal. 01/17/20  Yes Samtani, Darylene Price, MD  nicotine (NICODERM CQ - DOSED IN MG/24 HOURS) 21 mg/24hr patch Place 1 patch (21 mg total) onto the skin daily. 01/17/20  Yes Nita Sells, MD  oxyCODONE (OXY IR/ROXICODONE) 5 MG immediate release tablet Take 1 tablet (5 mg total) by mouth every 6 (six) hours as needed for moderate pain or breakthrough pain (Hold & Call MD if SBP<90, HR<65, RR<10, O2<90, or altered mental status.). 01/24/20  Yes [name], MD  warfarin (COUMADIN) 5 MG tablet Take 1 tablet by mouth daily. Follow-up with [facility] for INR and dosing instructions Patient taking differently: Take 5 mg by mouth at bedtime. Take  1 tablet by mouth daily. Follow-up with [facility] for INR and dosing instructions 01/17/20  Yes Nita Sells, MD  acetaminophen (TYLENOL) 325 MG tablet Take 2 tablets (650 mg total) by mouth every 6 (six) hours as needed for mild pain (or Fever >/= 101). Patient not taking: Reported on 01/27/2020 01/17/20   Nita Sells, MD    Allergies    Other  Review of Systems   Review of Systems Ten systems are reviewed and are negative for acute change except as noted in the HPI  Physical Exam Updated Vital Signs BP (!) 139/101   Pulse 100   Temp 98.1 F (36.7 C) (Oral)   Resp (!) 22   SpO2 98%   Physical Exam Constitutional:      General: He is not in acute distress.    Appearance: Normal appearance. He is well-developed. He is not ill-appearing or diaphoretic.  HENT:     Head: Normocephalic and atraumatic.     Right Ear: External ear normal.     Left Ear:  External ear normal.     Nose: Nose normal.  Eyes:     General: Vision grossly intact. Gaze aligned appropriately.     Pupils: Pupils are equal, round, and reactive to light.  Neck:     Trachea: Trachea and phonation normal. No tracheal deviation.  Cardiovascular:     Rate and Rhythm: Normal rate and regular rhythm.  Pulmonary:     Effort: Pulmonary effort is normal. No respiratory distress.     Breath sounds: Normal breath sounds.  Abdominal:     General: There is no distension.     Palpations: Abdomen is soft.     Tenderness: There is no abdominal tenderness. There is no guarding or rebound.  Musculoskeletal:        General: Normal range of motion.     Cervical back: Normal range of motion.  Feet:     Right foot:     Protective Sensation: 3 sites tested. 3 sites sensed.     Left foot:     Protective Sensation: 3 sites tested. 3 sites sensed.     Comments: Moderate swelling of the remaining medial left foot, bloody/purulent drainage present, tender to palpation.  Movement at the left ankle and knee intact without pain. Skin:    General: Skin is warm and dry.  Neurological:     Mental Status: He is alert.     GCS: GCS eye subscore is 4. GCS verbal subscore is 5. GCS motor subscore is 6.     Comments: Speech is clear and goal oriented, follows commands Major Cranial nerves without deficit, no facial droop Moves extremities without ataxia, coordination intact  Psychiatric:        Behavior: Behavior normal.     ED Results / Procedures / Treatments   Labs (all labs ordered are listed, but only abnormal results are displayed) Labs Reviewed  LACTIC ACID, PLASMA - Abnormal; Notable for the following components:      Result Value   Lactic Acid, Venous 4.9 (*)    All other components within normal limits  LACTIC ACID, PLASMA - Abnormal; Notable for the following components:   Lactic Acid, Venous 2.7 (*)    All other components within normal limits  COMPREHENSIVE METABOLIC  PANEL - Abnormal; Notable for the following components:   Chloride 94 (*)    Glucose, Bld 107 (*)    BUN <5 (*)    AST 56 (*)  Anion gap 17 (*)    All other components within normal limits  CBC WITH DIFFERENTIAL/PLATELET - Abnormal; Notable for the following components:   WBC 3.2 (*)    RDW 15.9 (*)    nRBC 1.9 (*)    Lymphs Abs 0.5 (*)    All other components within normal limits  URINALYSIS, ROUTINE W REFLEX MICROSCOPIC - Abnormal; Notable for the following components:   Hgb urine dipstick SMALL (*)    Ketones, ur 5 (*)    Protein, ur 30 (*)    All other components within normal limits  PROTIME-INR - Abnormal; Notable for the following components:   Prothrombin Time 37.3 (*)    INR 3.8 (*)    All other components within normal limits  APTT - Abnormal; Notable for the following components:   aPTT 52 (*)    All other components within normal limits  CULTURE, BLOOD (ROUTINE X 2)  CULTURE, BLOOD (ROUTINE X 2)  SARS CORONAVIRUS 2 (TAT 6-24 HRS)  RESPIRATORY PANEL BY RT PCR (FLU A&B, COVID)  TROPONIN I (HIGH SENSITIVITY)  TROPONIN I (HIGH SENSITIVITY)    EKG EKG Interpretation  Date/Time:  Sunday January 27 2020 15:25:07 EDT Ventricular Rate:  94 PR Interval:  170 QRS Duration: 120 QT Interval:  400 QTC Calculation: 500 R Axis:   -54 Text Interpretation: Normal sinus rhythm Right bundle branch block Left anterior fascicular block Abnormal ECG Confirmed by Dene Gentry [phone]) on 01/27/2020 4:27:34 PM   Radiology DG Chest Portable 1 View  Result Date: 01/27/2020 CLINICAL DATA:  Shortness of breath. EXAM: PORTABLE CHEST 1 VIEW COMPARISON:  January 11, 2020 FINDINGS: The heart size and mediastinal contours are within normal limits. Both lungs are clear. The visualized skeletal structures are unremarkable. Again identified are innumerable bilateral pulmonary nodules, stable across prior studies. IMPRESSION: No active disease. Electronically Signed   By: Constance Holster M.D.    On: 01/27/2020 17:30   DG Foot Complete Left  Result Date: 01/27/2020 CLINICAL DATA:  Pain and bleeding from surgical site EXAM: LEFT FOOT - COMPLETE 3+ VIEW COMPARISON:  January 11, 2020 FINDINGS: The patient has undergone interval amputation through the distal first and second metatarsals. There is a displaced fracture of the third metatarsal head with a fracture fragment measuring approximately 1.5 cm. There is soft tissue swelling about the foot. IMPRESSION: 1. New displaced fracture through the third metatarsal head. This may represent a pathologic fracture secondary to underlying osteomyelitis. 2. Status post interval amputation of the first and second metatarsal heads. 3. There is nonspecific soft tissue swelling about the foot. Electronically Signed   By: Constance Holster M.D.   On: 01/27/2020 17:39    Procedures Procedures (including critical care time)  Medications Ordered in ED Medications  sodium chloride flush (NS) 0.9 % injection 3 mL (3 mLs Intravenous Not Given 01/27/20 1821)  vancomycin (VANCOREADY) IVPB 2000 mg/400 mL (2,000 mg Intravenous New Bag/Given 01/27/20 2012)  vancomycin (VANCOCIN) IVPB 1000 mg/200 mL premix (has no administration in time range)  sodium chloride 0.9 % bolus 1,000 mL (1,000 mLs Intravenous New Bag/Given 01/27/20 1858)  cefTRIAXone (ROCEPHIN) 2 g in sodium chloride 0.9 % 100 mL IVPB (0 g Intravenous Stopped 01/27/20 2010)  ondansetron (ZOFRAN) injection 2 mg (2 mg Intravenous Given 01/27/20 2010)    ED Course  I have reviewed the triage vital signs and the nursing notes.  Pertinent labs & imaging results that were available during my care of the patient were reviewed by  me and considered in my medical decision making (see chart for details).  Clinical Course as of Jan 26 2026  Sun Jan 27, 2020  1627 Patient not in room   [BM]  1844 Dr. [name]   [BM]  [id number] Dr. Hal Hope   [BM]    Clinical Course User Index [BM] Gari Crown   MDM  Rules/Calculators/A&P                     59 year old male with history as detailed above presents today with infection of the left foot, recent amputation performed, patient reports he is not on any antibiotics.  Pain and swelling have increased over the past several days and is now developed purulent/bloody drainage of the area.  There is infection of the surgical site, he is neurovascular intact to the remaining foot, movement at the ankle and knee is intact without pain.  No evidence of DVT or compartment syndrome.  Concern for cellulitis possibly osteomyelitis, will obtain x-ray of the foot.  Lab work obtained in triage is significant for a lactic acidosis of 4.9, CBC shows mild leukopenia of 3.2, no evidence of anemia and CMP shows slight gap at 17, no emergent electrolyte derangement evidence of kidney injury or significant elevation of LFTs.  Patient's vital signs are stable initial evaluation he is not septic appearing.  Patient does use alcohol daily suspect lab abnormalities may be secondary to dehydration alcohol use versus sepsis at this time however will start patient with fluid bolus, empiric antibiotics vancomycin and Rocephin and obtain blood cultures.  Additionally will obtain troponin as well as chest x-ray for evaluation patient's shortness of breath.  Low suspicion for pulmonary embolism as etiology of shortness of breath today as he is on both Coumadin and Xarelto, no tachycardia or hypoxia on room air. - Patient second lactic has improved to 2.7 prior to him receiving any fluids or antibiotics. ======== PT/INR elevated 3.8 and 37.3, APTT 52.  Urinalysis shows evidence of dehydration no evidence of infection.  CXR:  IMPRESSION:  No active disease.   DG Left Foot:  IMPRESSION:  1. New displaced fracture through the third metatarsal head. This  may represent a pathologic fracture secondary to underlying  osteomyelitis.  2. Status post interval amputation of the first and second    metatarsal heads.  3. There is nonspecific soft tissue swelling about the foot.   EKG: Normal sinus rhythm Right bundle branch block Left anterior fascicular block Abnormal ECG Confirmed by Dene Gentry [phone]) on 01/27/2020 4:27:34 PM  High-sensitivity troponin within normal limits, as symptoms ongoing for several days no indication for delta troponin.  Doubt ACS, PE, dissection or other acute cardiopulmonary etiologies as etiology patient's symptoms at this time. - 6:44 PM: Case discussed with Dr. [name] who recommends hospitalist admission, plans to see patient tomorrow morning. - Patient reassessed resting comfortably no acute distress states understanding of work-up above and plan of care, he is agreeable for admission. - 7:33 PM: Discussed case with hospitalist Dr. Hal Hope who has accepted patient to his service for admission.  Patient seen and evaluated by Dr. Francia Greaves during this visit.  Note: Portions of this report may have been transcribed using voice recognition software. Every effort was made to ensure accuracy; however, inadvertent computerized transcription errors may still be present. Final Clinical Impression(s) / ED Diagnoses Final diagnoses:  Osteomyelitis, unspecified site, unspecified type ([place])    Rx / DC Orders ED Discharge Orders    None  Gari Crown 01/27/20 2027    Valarie Merino, MD 01/31/20 [phone]

## 2020-01-27 NOTE — H&P (Signed)
History and Physical    Leonard Bentley [id number] DOB: 01/13/1961 DOA: 01/27/2020  PCP: [name], NP   Patient coming from: Home.  Chief Complaint: Left foot pain and discharge.  HPI: Leonard Bentley is a 59 y.o. male with what a recent left foot first and second ray amputation for osteomyelitis and discharged on March 10/27/2020 with history of minimally invasive mitral valve repair, chronic combined systolic and diastolic CHF last EF is improved to 55 to 60%, alcohol abuse, non-Hodgkin's lymphoma stage II in remission presents to the ER with complaint of increasing pain and discharge from the left foot.  Patient states he was doing fine and had followed up with orthopedics last week.  Today he found increasing bloody discharge with increasing pain in the left foot and was instructed to come to the ER.  Denies any trauma or fall.  ED Course: In the ER patient was tachycardic lactate was elevated at around 4.  X-rays show fracture involving the left third metatarsal concerning for osteomyelitis.  On-call orthopedic surgeon Dr. [name] who did surgery last time was consulted and will be seeing patient in consult.  Covid test is negative.  EKG shows normal sinus rhythm.  Labs show creatinine 0.8 CBC shows WBC of 3.2 INR was 3.8.  Patient started on empiric antibiotics.  1 dose of vitamin K 2.5 IV was given in anticipation of possible procedure.  Review of Systems: As per HPI, rest all negative.   Past Medical History:  Diagnosis Date  . Acute pulmonary embolism ([place]) 04/11/2016  . Anxiety   . Arthritis   . Atrial tachycardia ([place]) 01/06/2016  . Depression   . DTs (delirium tremens) ([place])   . Dyspnea   . ED (erectile dysfunction)   . ETOH abuse   . Hypertension   . Lymphoma, non Hodgkin's 09/25/2011   Stage 2  . Mitral regurgitation 01/20/2016  . Mobitz I 10/09/2015  . Murmur 01/06/2016  . Occasional tremors   . PAC (premature atrial contraction) 10/09/2015  . S/P minimally-invasive  mitral valve repair 10/27/2016   Complex valvuloplasty including artificial Gore-tex neochord placement x6 and 34 mm Edwards Physio II ring annuloplasty via right mini thoracotomy approach    Past Surgical History:  Procedure Laterality Date  . AMPUTATION Left 01/12/2019   Procedure: LEFT GREAT TOE AMPUTATION;  Surgeon: [name], MD;  Location: [facility];  Service: Orthopedics;  Laterality: Left;  . AMPUTATION Left 01/16/2020   Procedure: LEFT 1ST METATARSAL AND SECOND TOE AMPUTATION;  Surgeon: [name], MD;  Location: [facility];  Service: Orthopedics;  Laterality: Left;  . CARDIAC CATHETERIZATION N/A 08/13/2016   Procedure: Right/Left Heart Cath and Coronary Angiography;  Surgeon: [name], MD;  Location: [facility];  Service: Cardiovascular;  Laterality: N/A;  . MITRAL VALVE REPAIR Right 10/27/2016   Procedure: MINIMALLY INVASIVE MITRAL VALVE REPAIR (MVR) USING 34 PHYSIO II ANNULOPLASTY RING;  Surgeon: [name], MD;  Location: [facility];  Service: Open Heart Surgery;  Laterality: Right;  . SKIN BIOPSY Right 04/04/2018   right mid medial anterior tibial shave  see report in chart  . TEE WITHOUT CARDIOVERSION N/A 02/11/2016   Procedure: TRANSESOPHAGEAL ECHOCARDIOGRAM (TEE);  Surgeon: [name], MD;  Location: [facility];  Service: Cardiovascular;  Laterality: N/A;  . TEE WITHOUT CARDIOVERSION N/A 10/27/2016   Procedure: TRANSESOPHAGEAL ECHOCARDIOGRAM (TEE);  Surgeon: [name], MD;  Location: [facility];  Service: Open Heart Surgery;  Laterality: N/A;  .  TRIGGER FINGER RELEASE Bilateral      reports that he has been smoking cigarettes and e-cigarettes. He has a 40.00 pack-year smoking history. He quit smokeless tobacco use about 36 years ago. He reports current alcohol use. He reports that he does not use drugs.  Allergies  Allergen Reactions  . Other Other (See Comments)    "Cactus" caused blisters on tongue (if prepared as food)    Family History    Problem Relation Age of Onset  . Cancer Mother        BREAST(BONE)  . Cancer Father        PANCREATIC  . Hypertension Maternal Grandmother   . Stroke Maternal Aunt   . Heart attack Neg Hx     Prior to Admission medications   Medication Sig Start Date End Date Taking? Authorizing Provider  enoxaparin (LOVENOX) 150 MG/ML injection Inject 1 mL (150 mg total) into the skin daily for 7 doses. 01/17/20 01/27/20 Yes Nita Sells, MD  metoprolol succinate (TOPROL-XL) 50 MG 24 hr tablet Take 1 tablet (50 mg total) by mouth daily. Take with or immediately following a meal. Patient taking differently: Take 50 mg by mouth at bedtime. Take with or immediately following a meal. 01/17/20  Yes Samtani, Darylene Price, MD  nicotine (NICODERM CQ - DOSED IN MG/24 HOURS) 21 mg/24hr patch Place 1 patch (21 mg total) onto the skin daily. 01/17/20  Yes Nita Sells, MD  oxyCODONE (OXY IR/ROXICODONE) 5 MG immediate release tablet Take 1 tablet (5 mg total) by mouth every 6 (six) hours as needed for moderate pain or breakthrough pain (Hold & Call MD if SBP<90, HR<65, RR<10, O2<90, or altered mental status.). 01/24/20  Yes [name], MD  warfarin (COUMADIN) 5 MG tablet Take 1 tablet by mouth daily. Follow[facility]up with [facility] for INR and dosing instructions Patient taking differently: Take 5 mg by mouth at bedtime. Take 1 tablet by mouth daily. Follow-up with [facility] for INR and dosing instructions 01/17/20  Yes Nita Sells, MD  acetaminophen (TYLENOL) 325 MG tablet Take 2 tablets (650 mg total) by mouth every 6 (six) hours as needed for mild pain (or Fever >/= 101). Patient not taking: Reported on 01/27/2020 01/17/20   Nita Sells, MD    Physical Exam: Constitutional: Moderately built and nourished. Vitals:   01/27/20 2052 01/27/20 2130 01/27/20 2145 01/27/20 2200  BP: (!) 146/109 (!) 128/106 (!) 137/107 (!) 126/96  Pulse: (!) 118 (!) 127 (!) 118 (!) 113  Resp: 17 (!) 21  16 14   Temp:      TempSrc:      SpO2: 97% 99% 99% 97%   Eyes: Anicteric no pallor. ENMT: No discharge from the ears eyes nose or mouth. Neck: No muscle felt.  No neck rigidity. Respiratory: No rhonchi or crepitations. Cardiovascular: S1-S2 heard. Abdomen: Soft nontender bowel sounds active. Musculoskeletal: Swelling and mild discharge from the left foot surgical site. Skin: Swelling and discharge from the left foot surgical site. Neurologic: Alert awake oriented time place and person.  Moves all extremities. Psychiatric: Appears normal per normal affect.   Labs on Admission: I have personally reviewed following labs and imaging studies  CBC: Recent Labs  Lab 01/27/20 1524  WBC 3.2*  NEUTROABS 2.1  HGB 13.3  HCT 41.3  MCV 82.9  PLT 123456   Basic Metabolic Panel: Recent Labs  Lab 01/27/20 1524  NA 136  K 4.7  CL 94*  CO2 25  GLUCOSE 107*  BUN <5*  CREATININE  0.85  CALCIUM 9.3   GFR: Estimated Creatinine Clearance: 108.9 mL/min (by C-G formula based on SCr of 0.85 mg/dL). Liver Function Tests: Recent Labs  Lab 01/27/20 1524  AST 56*  ALT 42  ALKPHOS 60  BILITOT 0.9  PROT 7.1  ALBUMIN 3.6   No results for input(s): LIPASE, AMYLASE in the last 168 hours. No results for input(s): AMMONIA in the last 168 hours. Coagulation Profile: Recent Labs  Lab 01/23/20 1513 01/27/20 1752  INR 1.9* 3.8*   Cardiac Enzymes: No results for input(s): CKTOTAL, CKMB, CKMBINDEX, TROPONINI in the last 168 hours. BNP (last 3 results) No results for input(s): PROBNP in the last 8760 hours. HbA1C: No results for input(s): HGBA1C in the last 72 hours. CBG: No results for input(s): GLUCAP in the last 168 hours. Lipid Profile: No results for input(s): CHOL, HDL, LDLCALC, TRIG, CHOLHDL, LDLDIRECT in the last 72 hours. Thyroid Function Tests: No results for input(s): TSH, T4TOTAL, FREET4, T3FREE, THYROIDAB in the last 72 hours. Anemia Panel: No results for input(s):  VITAMINB12, FOLATE, FERRITIN, TIBC, IRON, RETICCTPCT in the last 72 hours. Urine analysis:    Component Value Date/Time   COLORURINE YELLOW 01/27/2020 1841   APPEARANCEUR CLEAR 01/27/2020 1841   APPEARANCEUR Clear 08/03/2019 1143   LABSPEC 1.015 01/27/2020 1841   PHURINE 6.0 01/27/2020 1841   GLUCOSEU NEGATIVE 01/27/2020 1841   HGBUR SMALL (A) 01/27/2020 1841   BILIRUBINUR NEGATIVE 01/27/2020 1841   BILIRUBINUR small (A) 08/03/2019 1144   BILIRUBINUR Negative 08/03/2019 1143   KETONESUR 5 (A) 01/27/2020 1841   PROTEINUR 30 (A) 01/27/2020 1841   UROBILINOGEN 1.0 08/03/2019 1144   UROBILINOGEN 1.0 01/25/2013 0825   NITRITE NEGATIVE 01/27/2020 1841   LEUKOCYTESUR NEGATIVE 01/27/2020 1841   Sepsis Labs: @LABRCNTIP (procalcitonin:4,lacticidven:4) ) Recent Results (from the past 240 hour(s))  Respiratory Panel by RT PCR (Flu A&B, Covid) - Nasopharyngeal Swab     Status: None   Collection Time: 01/27/20  8:09 PM   Specimen: Nasopharyngeal Swab  Result Value Ref Range Status   SARS Coronavirus 2 by RT PCR NEGATIVE NEGATIVE Final    Comment: (NOTE) SARS-CoV-2 target nucleic acids are NOT DETECTED. The SARS-CoV-2 RNA is generally detectable in upper respiratoy specimens during the acute phase of infection. The lowest concentration of SARS-CoV-2 viral copies this assay can detect is 131 copies/mL. A negative result does not preclude SARS-Cov-2 infection and should not be used as the sole basis for treatment or other patient management decisions. A negative result may occur with  improper specimen collection/handling, submission of specimen other than nasopharyngeal swab, presence of viral mutation(s) within the areas targeted by this assay, and inadequate number of viral copies (<131 copies/mL). A negative result must be combined with clinical observations, patient history, and epidemiological information. The expected result is Negative. Fact Sheet for Patients:    PinkCheek.be Fact Sheet for Healthcare Providers:  GravelBags.it This test is not yet ap proved or cleared by the [place] FDA and  has been authorized for detection and/or diagnosis of SARS-CoV-2 by FDA under an Emergency Use Authorization (EUA). This EUA will remain  in effect (meaning this test can be used) for the duration of the COVID-19 declaration under Section 564(b)(1) of the Act, 21 U.S.C. section 360bbb-3(b)(1), unless the authorization is terminated or revoked sooner.    Influenza A by PCR NEGATIVE NEGATIVE Final   Influenza B by PCR NEGATIVE NEGATIVE Final    Comment: (NOTE) The Xpert Xpress SARS-CoV-2/FLU/RSV assay is intended as an aid  in  the diagnosis of influenza from Nasopharyngeal swab specimens and  should not be used as a sole basis for treatment. Nasal washings and  aspirates are unacceptable for Xpert Xpress SARS-CoV-2/FLU/RSV  testing. Fact Sheet for Patients: PinkCheek.be Fact Sheet for Healthcare Providers: GravelBags.it This test is not yet approved or cleared by the [place] FDA and  has been authorized for detection and/or diagnosis of SARS-CoV-2 by  FDA under an Emergency Use Authorization (EUA). This EUA will remain  in effect (meaning this test can be used) for the duration of the  Covid-19 declaration under Section 564(b)(1) of the Act, 21  U.S.C. section 360bbb-3(b)(1), unless the authorization is  terminated or revoked. Performed at [facility], [place] [address] [place], [place] [address]      Radiological Exams on Admission: DG Chest Portable 1 View  Result Date: 01/27/2020 CLINICAL DATA:  Shortness of breath. EXAM: PORTABLE CHEST 1 VIEW COMPARISON:  January 11, 2020 FINDINGS: The heart size and mediastinal contours are within normal limits. Both lungs are clear. The visualized skeletal structures are unremarkable.  Again identified are innumerable bilateral pulmonary nodules, stable across prior studies. IMPRESSION: No active disease. Electronically Signed   By: Constance Holster M.D.   On: 01/27/2020 17:30   DG Foot Complete Left  Result Date: 01/27/2020 CLINICAL DATA:  Pain and bleeding from surgical site EXAM: LEFT FOOT - COMPLETE 3+ VIEW COMPARISON:  January 11, 2020 FINDINGS: The patient has undergone interval amputation through the distal first and second metatarsals. There is a displaced fracture of the third metatarsal head with a fracture fragment measuring approximately 1.5 cm. There is soft tissue swelling about the foot. IMPRESSION: 1. New displaced fracture through the third metatarsal head. This may represent a pathologic fracture secondary to underlying osteomyelitis. 2. Status post interval amputation of the first and second metatarsal heads. 3. There is nonspecific soft tissue swelling about the foot. Electronically Signed   By: Constance Holster M.D.   On: 01/27/2020 17:39    EKG: Independently reviewed.  Normal sinus rhythm RBBB.  Assessment/Plan Principal Problem:   Sepsis ([place]) Active Problems:   Alcohol abuse   History of pulmonary embolus (PE)   S/P minimally-invasive mitral valve repair   Osteomyelitis ([place])    1. Possible developing sepsis secondary to osteomyelitis of the left foot Third metatarsal with recent surgery for left foot first and second ray presently on antibiotics we will keep patient n.p.o. past midnight.  Dr. Melrose Nakayama has been consulted.  Patient's INR is around 3.8 after discussing with pharmacy 2.5 mg IV vitamin K was given.  Repeat INR in the morning to see if it is improving. 2. History of recurrent PE presently INR is therapeutic at 3.8 but in anticipation of surgery will hold Coumadin discussed with pharmacy vitamin K 2.5 he was given.  If no surgery is planned then will need to start heparin. 3. History of alcohol abuse patient admits to taking alcohol usually  drinks a bottle of wine.  Last drink was yesterday.  On CIWA protocol.  Advised to quit. 4. Tobacco abuse advised about quitting. 5. Has some discoloration of the right foot great and second toe.  Will need ABI when stable. 6. History of minimally invasive mitral valve repair. 7. History of chronic combined systolic and diastolic CHF appears to be compensated EF has recently improved with 2D echo done in 2019 showed EF of 55 to 60%. 8. History of stage II non-Hodgkin's lymphoma status post chemo presently  in remission.  Given the septic-like picture patient will need close monitoring for any further deterioration in inpatient status.   DVT prophylaxis: SCDs for now.  We will need to start heparin once surgery is overall not planned. Code Status: Full code. Family Communication: Discussed with patient. Disposition Plan: To be determined. Consults called: Dr. [name].  Orthopedic surgery. Admission status: Inpatient.   [name] MD [facility] Pager [phone].  If 7PM-7AM, please contact night-coverage [email or web address] Password [email or web address]  01/27/2020, 10:10 PM

## 2020-01-28 ENCOUNTER — Inpatient Hospital Stay (HOSPITAL_COMMUNITY): Admission: RE | Admit: 2020-01-28 | Payer: Self-pay | Source: Ambulatory Visit

## 2020-01-28 ENCOUNTER — Inpatient Hospital Stay (HOSPITAL_COMMUNITY): Payer: Medicaid Other

## 2020-01-28 DIAGNOSIS — I96 Gangrene, not elsewhere classified: Secondary | ICD-10-CM

## 2020-01-28 DIAGNOSIS — S98912D Complete traumatic amputation of left foot, level unspecified, subsequent encounter: Secondary | ICD-10-CM

## 2020-01-28 DIAGNOSIS — I739 Peripheral vascular disease, unspecified: Secondary | ICD-10-CM

## 2020-01-28 DIAGNOSIS — E872 Acidosis: Secondary | ICD-10-CM

## 2020-01-28 DIAGNOSIS — M86072 Acute hematogenous osteomyelitis, left ankle and foot: Secondary | ICD-10-CM

## 2020-01-28 DIAGNOSIS — Z86711 Personal history of pulmonary embolism: Secondary | ICD-10-CM

## 2020-01-28 DIAGNOSIS — Z9889 Other specified postprocedural states: Secondary | ICD-10-CM

## 2020-01-28 DIAGNOSIS — L8990 Pressure ulcer of unspecified site, unspecified stage: Secondary | ICD-10-CM | POA: Insufficient documentation

## 2020-01-28 DIAGNOSIS — F17200 Nicotine dependence, unspecified, uncomplicated: Secondary | ICD-10-CM

## 2020-01-28 LAB — CBC
HCT: 37.9 % — ABNORMAL LOW (ref 39.0–52.0)
Hemoglobin: 12.2 g/dL — ABNORMAL LOW (ref 13.0–17.0)
MCH: 27.3 pg (ref 26.0–34.0)
MCHC: 32.2 g/dL (ref 30.0–36.0)
MCV: 84.8 fL (ref 80.0–100.0)
Platelets: 224 10*3/uL (ref 150–400)
RBC: 4.47 MIL/uL (ref 4.22–5.81)
RDW: 16 % — ABNORMAL HIGH (ref 11.5–15.5)
WBC: 2.7 10*3/uL — ABNORMAL LOW (ref 4.0–10.5)
nRBC: 2.2 % — ABNORMAL HIGH (ref 0.0–0.2)

## 2020-01-28 LAB — COMPREHENSIVE METABOLIC PANEL
ALT: 32 U/L (ref 0–44)
AST: 43 U/L — ABNORMAL HIGH (ref 15–41)
Albumin: 3.2 g/dL — ABNORMAL LOW (ref 3.5–5.0)
Alkaline Phosphatase: 50 U/L (ref 38–126)
Anion gap: 14 (ref 5–15)
BUN: 6 mg/dL (ref 6–20)
CO2: 26 mmol/L (ref 22–32)
Calcium: 8.9 mg/dL (ref 8.9–10.3)
Chloride: 97 mmol/L — ABNORMAL LOW (ref 98–111)
Creatinine, Ser: 0.91 mg/dL (ref 0.61–1.24)
GFR calc Af Amer: >60 mL/min (ref >60)
GFR calc non Af Amer: >60 mL/min (ref >60)
Glucose, Bld: 98 mg/dL (ref 70–99)
Potassium: 4.4 mmol/L (ref 3.5–5.1)
Sodium: 137 mmol/L (ref 135–145)
Total Bilirubin: 1.1 mg/dL (ref 0.3–1.2)
Total Protein: 6.2 g/dL — ABNORMAL LOW (ref 6.5–8.1)

## 2020-01-28 LAB — PROCALCITONIN: Procalcitonin: <0.1 ng/mL

## 2020-01-28 LAB — PHOSPHORUS: Phosphorus: 3.9 mg/dL (ref 2.5–4.6)

## 2020-01-28 LAB — MAGNESIUM: Magnesium: 1.9 mg/dL (ref 1.7–2.4)

## 2020-01-28 LAB — PROTIME-INR
INR: 2.6 — ABNORMAL HIGH (ref 0.8–1.2)
Prothrombin Time: 27.7 seconds — ABNORMAL HIGH (ref 11.4–15.2)

## 2020-01-28 LAB — GLUCOSE, CAPILLARY: Glucose-Capillary: 156 mg/dL — ABNORMAL HIGH (ref 70–99)

## 2020-01-28 LAB — LACTIC ACID, PLASMA: Lactic Acid, Venous: 1.2 mmol/L (ref 0.5–1.9)

## 2020-01-28 LAB — SEDIMENTATION RATE: Sed Rate: 14 mm/hr (ref 0–16)

## 2020-01-28 LAB — C-REACTIVE PROTEIN: CRP: 1.2 mg/dL — ABNORMAL HIGH (ref <1.0)

## 2020-01-28 MED ORDER — SODIUM CHLORIDE 0.9 % IV SOLN: INTRAVENOUS | Status: DC

## 2020-01-28 MED ORDER — VITAMIN K1 10 MG/ML IJ SOLN
2.5000 mg | Freq: Once | INTRAVENOUS | Status: AC
  Administered 2020-01-28: 2.5 mg via INTRAVENOUS
  Filled 2020-01-28: qty 0.25

## 2020-01-28 MED ORDER — METOPROLOL TARTRATE 25 MG PO TABS
25.0000 mg | ORAL_TABLET | Freq: Two times a day (BID) | ORAL | Status: DC
  Administered 2020-01-28 – 2020-02-25 (×57): 25 mg via ORAL
  Filled 2020-01-28 (×57): qty 1

## 2020-01-28 MED ORDER — METOPROLOL TARTRATE 5 MG/5ML IV SOLN
2.5000 mg | Freq: Four times a day (QID) | INTRAVENOUS | Status: DC
  Administered 2020-01-28: 2.5 mg via INTRAVENOUS
  Filled 2020-01-28: qty 5

## 2020-01-28 NOTE — Progress Notes (Signed)
PROGRESS NOTE  Leonard Bentley BSJ:[id number] DOB: [birth year]   PCP: [name], NP  Patient is from: Home.  Lives alone.  Uses cane and walker since he was left foot surgery on 01/16/2020.  DOA: 01/27/2020 LOS: 1  Brief Narrative / Interim history: 59 year old male with history of left foot osteomyelitis status post first and second left ray amputation on 01/16/2020 by Dr. [name], combined CHF with last EF improved to 55/60%, mitral valve repair, history of PE on warfarin, stage II NHL in remission, EtOH abuse and tobacco abuse presenting with increased pain and drainage from left foot.  No fever or trauma involved.  Patient was discharged on 3/12.  Not on antibiotic.  In ED, HDS on arrival but became tachycardic to 120s.  100% on room air.  Afebrile.  WBC 3.2.  Anion gap 17.  Otherwise, CBC with differential and CMP without significant finding.  Lactic acid 4.9> 2.7.  HS Trop negative x2.  EKG NSR.  Influenza PCR and COVID-19 negative.  INR 3.8.  CXR without acute finding.  DG left foot with new displaced fracture through the third metatarsal head concerning for pathologic fracture secondary to underlying osteomyelitis, and nonspecific soft tissue swelling about the foot.  Patient received IV vitamin K 2.5 and empiric antibiotics and admitted.  Reportedly orthopedic surgery, Dr. [name] consulted.    Subjective: Seen and examined earlier this morning.  Reports improvement in his pain.  Reports chills but denies fever.  Has some shortness of breath for the last 2 years but no acute change.  Denies chest pain, abdominal pain, nausea or vomiting.   Objective: Vitals:   01/28/20 1000 01/28/20 1045 01/28/20 1100 01/28/20 1200  BP: (!) 112/92  (!) 138/102 (!) 139/107  Pulse: 73 69  68  Resp: _0 Temp:      TempSrc:      SpO2: 100% 96%  94%    Intake/Output Summary (Last 24 hours) at 01/28/2020 1359 Last data filed at 01/28/2020 0138 Gross per 24 hour  Intake 1550 ml  Output --  Net  1550 ml   There were no vitals filed for this visit.  Examination:  GENERAL: No acute distress.  Appears well.  HEENT: MMM.  Vision and hearing grossly intact.  NECK: Supple.  No apparent JVD.  RESP:  No IWOB. Good air movement bilaterally. CVS:  RRR. Heart sounds normal.  ABD/GI/GU: Bowel sounds present. Soft. Non tender.  MSK/EXT:  Moves extremities. S/p left first and second ray amputation.  Stitches in place.  No apparent discharge or drainage.  2+ DP pulse on the left.  Dry gangrene over his first and second toe on the right.  Faint DP pulse on the right. SKIN: Surgical wound with a stitch over left foot as below.  No erythema or increased warmth to touch.  No purulent drainage. NEURO: Awake, alert and oriented appropriately.  No apparent focal neuro deficit. PSYCH: Calm. Normal affect.    Procedures:  None.  Assessment & Plan: Osteomyelitis and pathologic fracture of left third metatarsal bone  Recent first and second ray amputation of left foot Sepsis due to osteomyelitis-sepsis physiology which could due to alcohol has resolved except for leukopenia. Bleeding from recent surgical wound-likely due to supratherapeutic INR.  Received vitamin K.  Anticoagulation on hold. -COVID-19, influenza PCR and blood cultures negative -Lactic acidosis resolved.  Procalcitonin negative. -Continue vancomycin and ceftriaxone -Discussed with Dr. [name] this morning-he will see patient -Check CRP and ESR  Dry gangrene with possible PAD of RLE-faint right DP pulse on exam.  Patient endorsed to have ABI but admitted. -Obtain bilateral ABI here.  Chronic diastolic CHF-previously with combined CHF but recovered LV function on echocardiogram in 08/2018.  No acute cardiopulmonary symptoms.  Does not seem to be on diuretics. -Monitor fluid status  History of PE on warfarin/supratherapeutic INR -Received subcu vitamin K 2.5 mg in ED. -Hold warfarin/anticoagulation pending Ortho eval.  Mitra valve  repair with annular ring prosthesis-stable.  Tachycardia/essential hypertension/history of SVT/PAT/PACs-due to withdrawal?   -IV metoprolol while n.p.o.  If no plan for surgery, will resume home metoprolol.  Alcohol abuse-reports drinking a bottle of wine a [place].  Last drink was the [place] prior to admission. -Encourage cessation -Consult CSW for resources-previously tried AA and rehab at [facility] with as needed Ativan -Continue vitamins  Tobacco use disorder: Reports smoking about a pack a [place]. -Encourage cessation -Nicotine patch  Lactic acidosis-likely due to alcohol versus sepsis.  Resolved.  Anion gap: Likely due to lactic acidosis and alcohol.  Resolved.  History of stage II NHL in remission- -outpatient follow-up.                     DVT prophylaxis: SCD Code Status: Full code Family Communication: Patient and/or RN. Available if any question.   Discharge barrier: Left foot fracture/osteomyelitis requiring IV antibiotics.  Patient is from: Home Final disposition: Likely home once cleared by orthopedic surgery  Consultants: Orthopedic surgery   Microbiology summarized: COVID-19 negative Influenza PCR negative Blood cultures negative  Sch Meds:  Scheduled Meds: . folic acid  1 mg Oral Daily  . LORazepam  0-4 mg Intravenous Q6H   Followed by  . [START ON 01/29/2020] LORazepam  0-4 mg Intravenous Q12H  . metoprolol tartrate  2.5 mg Intravenous Q6H  . multivitamin with minerals  1 tablet Oral Daily  . nicotine  14 mg Transdermal Daily  . thiamine  100 mg Oral Daily   Or  . thiamine  100 mg Intravenous Daily   Continuous Infusions: . sodium chloride 75 mL/hr at 01/28/20 0246  . cefTRIAXone (ROCEPHIN)  IV    . vancomycin Stopped (01/28/20 0746)   PRN Meds:.acetaminophen **OR** acetaminophen, LORazepam **OR** LORazepam, morphine injection, ondansetron **OR** ondansetron (ZOFRAN) IV  Antimicrobials: Anti-infectives (From admission,  onward)   Start     Dose/Rate Route Frequency Ordered Stop   01/28/20 1800  cefTRIAXone (ROCEPHIN) 2 g in sodium chloride 0.9 % 100 mL IVPB     2 g 200 mL/hr over 30 Minutes Intravenous Every 24 hours 01/27/20 2211     01/28/20 0600  vancomycin (VANCOCIN) IVPB 1000 mg/200 mL premix     1,000 mg 200 mL/hr over 60 Minutes Intravenous Every 12 hours 01/27/20 1738     01/27/20 1730  vancomycin (VANCOCIN) IVPB 1000 mg/200 mL premix  Status:  Discontinued     1,000 mg 200 mL/hr over 60 Minutes Intravenous  Once 01/27/20 1725 01/27/20 1726   01/27/20 1730  cefTRIAXone (ROCEPHIN) 2 g in sodium chloride 0.9 % 100 mL IVPB     2 g 200 mL/hr over 30 Minutes Intravenous  Once 01/27/20 1725 01/27/20 2010   01/27/20 1730  vancomycin (VANCOREADY) IVPB 2000 mg/400 mL     2,000 mg 200 mL/hr over 120 Minutes Intravenous  Once 01/27/20 1726 01/27/20 2219       I have personally reviewed the following labs and images: CBC: Recent Labs  Lab 01/27/20  1524 01/28/20 0800  WBC 3.2* 2.7*  NEUTROABS 2.1  --   HGB 13.3 12.2*  HCT 41.3 37.9*  MCV 82.9 84.8  PLT 273 224   BMP &GFR Recent Labs  Lab 01/27/20 1524 01/28/20 0800  NA 136 137  K 4.7 4.4  CL 94* 97*  CO2 25 26  GLUCOSE 107* 98  BUN <5* 6  CREATININE 0.85 0.91  CALCIUM 9.3 8.9  MG  --  1.9  PHOS  --  3.9   Estimated Creatinine Clearance: 101.7 mL/min (by C-G formula based on SCr of 0.91 mg/dL). Liver & Pancreas: Recent Labs  Lab 01/27/20 1524 01/28/20 0800  AST 56* 43*  ALT 42 32  ALKPHOS 60 50  BILITOT 0.9 1.1  PROT 7.1 6.2*  ALBUMIN 3.6 3.2*   No results for input(s): LIPASE, AMYLASE in the last 168 hours. No results for input(s): AMMONIA in the last 168 hours. Diabetic: No results for input(s): HGBA1C in the last 72 hours. No results for input(s): GLUCAP in the last 168 hours. Cardiac Enzymes: No results for input(s): CKTOTAL, CKMB, CKMBINDEX, TROPONINI in the last 168 hours. No results for input(s): PROBNP in the  last 8760 hours. Coagulation Profile: Recent Labs  Lab 01/23/20 1513 01/27/20 1752 01/28/20 0800  INR 1.9* 3.8* 2.6*   Thyroid Function Tests: No results for input(s): TSH, T4TOTAL, FREET4, T3FREE, THYROIDAB in the last 72 hours. Lipid Profile: No results for input(s): CHOL, HDL, LDLCALC, TRIG, CHOLHDL, LDLDIRECT in the last 72 hours. Anemia Panel: No results for input(s): VITAMINB12, FOLATE, FERRITIN, TIBC, IRON, RETICCTPCT in the last 72 hours. Urine analysis:    Component Value Date/Time   COLORURINE YELLOW 01/27/2020 1841   APPEARANCEUR CLEAR 01/27/2020 1841   APPEARANCEUR Clear 08/03/2019 1143   LABSPEC 1.015 01/27/2020 1841   PHURINE 6.0 01/27/2020 1841   GLUCOSEU NEGATIVE 01/27/2020 1841   HGBUR SMALL (A) 01/27/2020 1841   BILIRUBINUR NEGATIVE 01/27/2020 1841   BILIRUBINUR small (A) 08/03/2019 1144   BILIRUBINUR Negative 08/03/2019 1143   KETONESUR 5 (A) 01/27/2020 1841   PROTEINUR 30 (A) 01/27/2020 1841   UROBILINOGEN 1.0 08/03/2019 1144   UROBILINOGEN 1.0 01/25/2013 0825   NITRITE NEGATIVE 01/27/2020 1841   LEUKOCYTESUR NEGATIVE 01/27/2020 1841   Sepsis Labs: Invalid input(s): PROCALCITONIN, [place]  Microbiology: Recent Results (from the past 240 hour(s))  Blood culture (routine x 2)     Status: None (Preliminary result)   Collection Time: 01/27/20  6:07 PM   Specimen: BLOOD RIGHT ARM  Result Value Ref Range Status   Specimen Description BLOOD RIGHT ARM  Final   Special Requests   Final    BOTTLES DRAWN AEROBIC AND ANAEROBIC Blood Culture results may not be optimal due to an excessive volume of blood received in culture bottles   Culture   Final    NO GROWTH < 24 HOURS Performed at [facility], [address], [place], [place] [address]    Report Status PENDING  Incomplete  Blood culture (routine x 2)     Status: None (Preliminary result)   Collection Time: 01/27/20  6:07 PM   Specimen: BLOOD LEFT HAND  Result Value Ref Range Status    Specimen Description BLOOD LEFT HAND  Final   Special Requests   Final    BOTTLES DRAWN AEROBIC AND ANAEROBIC Blood Culture results may not be optimal due to an excessive volume of blood received in culture bottles   Culture   Final    NO GROWTH <  24 HOURS Performed at [facility], [place] [address], [place], [place] [address]    Report Status PENDING  Incomplete  Respiratory Panel by RT PCR (Flu A&B, Covid) - Nasopharyngeal Swab     Status: None   Collection Time: 01/27/20  8:09 PM   Specimen: Nasopharyngeal Swab  Result Value Ref Range Status   SARS Coronavirus 2 by RT PCR NEGATIVE NEGATIVE Final    Comment: (NOTE) SARS-CoV-2 target nucleic acids are NOT DETECTED. The SARS-CoV-2 RNA is generally detectable in upper respiratoy specimens during the acute phase of infection. The lowest concentration of SARS-CoV-2 viral copies this assay can detect is 131 copies/mL. A negative result does not preclude SARS-Cov-2 infection and should not be used as the sole basis for treatment or other patient management decisions. A negative result may occur with  improper specimen collection/handling, submission of specimen other than nasopharyngeal swab, presence of viral mutation(s) within the areas targeted by this assay, and inadequate number of viral copies (<131 copies/mL). A negative result must be combined with clinical observations, patient history, and epidemiological information. The expected result is Negative. Fact Sheet for Patients:  PinkCheek.be Fact Sheet for Healthcare Providers:  GravelBags.it This test is not yet ap proved or cleared by the [place] FDA and  has been authorized for detection and/or diagnosis of SARS-CoV-2 by FDA under an Emergency Use Authorization (EUA). This EUA will remain  in effect (meaning this test can be used) for the duration of the COVID-19 declaration under Section 564(b)(1) of the Act,  21 U.S.C. section 360bbb-3(b)(1), unless the authorization is terminated or revoked sooner.    Influenza A by PCR NEGATIVE NEGATIVE Final   Influenza B by PCR NEGATIVE NEGATIVE Final    Comment: (NOTE) The Xpert Xpress SARS-CoV-2/FLU/RSV assay is intended as an aid in  the diagnosis of influenza from Nasopharyngeal swab specimens and  should not be used as a sole basis for treatment. Nasal washings and  aspirates are unacceptable for Xpert Xpress SARS-CoV-2/FLU/RSV  testing. Fact Sheet for Patients: PinkCheek.be Fact Sheet for Healthcare Providers: GravelBags.it This test is not yet approved or cleared by the [place] FDA and  has been authorized for detection and/or diagnosis of SARS-CoV-2 by  FDA under an Emergency Use Authorization (EUA). This EUA will remain  in effect (meaning this test can be used) for the duration of the  Covid-19 declaration under Section 564(b)(1) of the Act, 21  U.S.C. section 360bbb-3(b)(1), unless the authorization is  terminated or revoked. Performed at [facility], [place] [address] [place], [place] [address]     Radiology Studies: DG Chest Portable 1 View  Result Date: 01/27/2020 CLINICAL DATA:  Shortness of breath. EXAM: PORTABLE CHEST 1 VIEW COMPARISON:  January 11, 2020 FINDINGS: The heart size and mediastinal contours are within normal limits. Both lungs are clear. The visualized skeletal structures are unremarkable. Again identified are innumerable bilateral pulmonary nodules, stable across prior studies. IMPRESSION: No active disease. Electronically Signed   By: Constance Holster M.D.   On: 01/27/2020 17:30   DG Foot Complete Left  Result Date: 01/27/2020 CLINICAL DATA:  Pain and bleeding from surgical site EXAM: LEFT FOOT - COMPLETE 3+ VIEW COMPARISON:  January 11, 2020 FINDINGS: The patient has undergone interval amputation through the distal first and second metatarsals. There is  a displaced fracture of the third metatarsal head with a fracture fragment measuring approximately 1.5 cm. There is soft tissue swelling about the foot. IMPRESSION: 1. New displaced  fracture through the third metatarsal head. This may represent a pathologic fracture secondary to underlying osteomyelitis. 2. Status post interval amputation of the first and second metatarsal heads. 3. There is nonspecific soft tissue swelling about the foot. Electronically Signed   By: Constance Holster M.D.   On: 01/27/2020 17:39      T. Asher  If 7PM-7AM, please contact night-coverage [email or web address] Password [facility] 01/28/2020, 1:59 PM

## 2020-01-28 NOTE — Progress Notes (Signed)
ABI's have been completed. Preliminary results can be found in CV Proc through chart review.   01/28/20 2:43 PM Leonard Bentley RVT

## 2020-01-29 ENCOUNTER — Other Ambulatory Visit: Payer: Self-pay | Admitting: Physician Assistant

## 2020-01-29 DIAGNOSIS — D649 Anemia, unspecified: Secondary | ICD-10-CM

## 2020-01-29 DIAGNOSIS — D72819 Decreased white blood cell count, unspecified: Secondary | ICD-10-CM

## 2020-01-29 DIAGNOSIS — T8131XA Disruption of external operation (surgical) wound, not elsewhere classified, initial encounter: Secondary | ICD-10-CM

## 2020-01-29 LAB — IRON AND TIBC
Iron: 113 ug/dL (ref 45–182)
Saturation Ratios: 44 % — ABNORMAL HIGH (ref 17.9–39.5)
TIBC: 259 ug/dL (ref 250–450)
UIBC: 146 ug/dL

## 2020-01-29 LAB — VITAMIN B12: Vitamin B-12: 785 pg/mL (ref 180–914)

## 2020-01-29 LAB — CBC
HCT: 33.5 % — ABNORMAL LOW (ref 39.0–52.0)
Hemoglobin: 10.9 g/dL — ABNORMAL LOW (ref 13.0–17.0)
MCH: 26.6 pg (ref 26.0–34.0)
MCHC: 32.5 g/dL (ref 30.0–36.0)
MCV: 81.7 fL (ref 80.0–100.0)
Platelets: 202 10*3/uL (ref 150–400)
RBC: 4.1 MIL/uL — ABNORMAL LOW (ref 4.22–5.81)
RDW: 15.5 % (ref 11.5–15.5)
WBC: 2.8 10*3/uL — ABNORMAL LOW (ref 4.0–10.5)
nRBC: 1.8 % — ABNORMAL HIGH (ref 0.0–0.2)

## 2020-01-29 LAB — RENAL FUNCTION PANEL
Albumin: 2.9 g/dL — ABNORMAL LOW (ref 3.5–5.0)
Anion gap: 8 (ref 5–15)
BUN: 7 mg/dL (ref 6–20)
CO2: 27 mmol/L (ref 22–32)
Calcium: 8.7 mg/dL — ABNORMAL LOW (ref 8.9–10.3)
Chloride: 100 mmol/L (ref 98–111)
Creatinine, Ser: 0.87 mg/dL (ref 0.61–1.24)
GFR calc Af Amer: >60 mL/min (ref >60)
GFR calc non Af Amer: >60 mL/min (ref >60)
Glucose, Bld: 126 mg/dL — ABNORMAL HIGH (ref 70–99)
Phosphorus: 3.4 mg/dL (ref 2.5–4.6)
Potassium: 4.4 mmol/L (ref 3.5–5.1)
Sodium: 135 mmol/L (ref 135–145)

## 2020-01-29 LAB — RETICULOCYTES
Immature Retic Fract: 27.4 % — ABNORMAL HIGH (ref 2.3–15.9)
RBC.: 4.13 MIL/uL — ABNORMAL LOW (ref 4.22–5.81)
Retic Count, Absolute: 67.3 10*3/uL (ref 19.0–186.0)
Retic Ct Pct: 1.6 % (ref 0.4–3.1)

## 2020-01-29 LAB — FOLATE: Folate: 18.1 ng/mL (ref >5.9)

## 2020-01-29 LAB — MAGNESIUM: Magnesium: 1.5 mg/dL — ABNORMAL LOW (ref 1.7–2.4)

## 2020-01-29 LAB — FERRITIN: Ferritin: 279 ng/mL (ref 24–336)

## 2020-01-29 MED ORDER — MAGNESIUM SULFATE 4 GM/100ML IV SOLN
4.0000 g | Freq: Once | INTRAVENOUS | Status: AC
  Administered 2020-01-29: 4 g via INTRAVENOUS
  Filled 2020-01-29: qty 100

## 2020-01-29 NOTE — Evaluation (Signed)
Physical Therapy Evaluation Patient Details Name: Leonard Bentley MRN: [id number] DOB: 02-22-1961 Today's Date: 01/29/2020   History of Present Illness  59 y.o. male with what a recent left foot first and second ray amputation for osteomyelitis and discharged on March 10/27/2020 with history of minimally invasive mitral valve repair, chronic combined systolic and diastolic CHF last EF is improved to 55 to 60%, alcohol abuse, non-Hodgkin's lymphoma stage II in remission presents to the ER with complaint of increasing pain and discharge from the left foot. following L foot first and 2nd ray amputation.   X-rays show fracture involving the left third metatarsal concerning for osteomyelitis. Admitted 01/27/20 for amputation revision sx.   Clinical Impression  Since discharge home with L foot 1st and 2nd ray amputation, pt reports having difficulty maintaining TDWB status ambulating with a RW and cane community level distances.  Pt also reports accidentally pulling out/breaking wound vac. Pt limited in safe mobility by decreased understanding of gravity of surgery and poor problem solving. Pt is also limited by decreased strength, endurance and balance, especially while trying to maintain TDWB. Pt is supervision for bed mobility, min A for transfers and min A progressing to modA for ambulation of 15 feet with RW. Pt is likely to have revision surgery on L foot tomorrow. PT recommends discharge to SNF level rehab for more positive outcomes of foot surgery, with DME training and improving strength and endurance. PT will continue to follow acutely.     Follow Up Recommendations SNF    Equipment Recommendations  Other (comment)(TBD at next venue)    Recommendations for Other Services OT consult     Precautions / Restrictions Precautions Required Braces or Orthoses: Other Brace Other Brace: post-op shoe Restrictions Weight Bearing Restrictions: Yes LLE Weight Bearing: Touchdown weight bearing Other  Position/Activity Restrictions: post op shoe      Mobility  Bed Mobility Overal bed mobility: Needs Assistance Bed Mobility: Supine to Sit;Sit to Supine     Supine to sit: Supervision;HOB elevated Sit to supine: Min guard;HOB elevated   General bed mobility comments: supervision for coming to sit EoB, min guard and verbal cues to manage LE back into bed   Transfers Overall transfer level: Needs assistance Equipment used: Rolling walker (2 wheeled)   Sit to Stand: Min assist         General transfer comment: minA for power up and steadying, max multimodal cuing for hand placement for powerup, coming to fully upright, and for maintain touchdown weight bearing  Ambulation/Gait Ambulation/Gait assistance: Min assist;Mod assist Gait Distance (Feet): 15 Feet Assistive device: Rolling walker (2 wheeled) Gait Pattern/deviations: Step-to pattern;Decreased weight shift to left;Antalgic;Trunk flexed Gait velocity: slowed Gait velocity interpretation: <1.31 ft/sec, indicative of household ambulator General Gait Details: minA progressing to [place] for steadying and maneuvering the RW, maximal multimodal cuing for maintaining TDWB as well as for sequencing ambulation and for upright posture. Pt becoming more tremulous with return to bed either due to anxiety, withdrawl or fatigue or some combination      Balance Overall balance assessment: Needs assistance Sitting-balance support: Feet supported Sitting balance-Leahy Scale: Good     Standing balance support: Bilateral upper extremity supported Standing balance-Leahy Scale: Poor Standing balance comment: unable to maintain TDWB without BUE support                             Pertinent Vitals/Pain Pain Assessment: 0-10 Pain Score: 8  Pain Location: L  foot Pain Descriptors / Indicators: Aching;Sore;Throbbing Pain Intervention(s): Limited activity within patient's tolerance;Monitored during session;Repositioned    Home  Living Family/patient expects to be discharged to:: Private residence Living Arrangements: Alone Available Help at Discharge: Family;Friend(s);Available PRN/intermittently Type of Home: House Home Access: Stairs to enter Entrance Stairs-Rails: Right Entrance Stairs-Number of Steps: 3 Home Layout: One level Home Equipment: Walker - 2 wheels      Prior Function Level of Independence: Needs assistance   Gait / Transfers Assistance Needed: ambulates with RW and is noncompliant with touch down weight bearing           Hand Dominance   Dominant Hand: Right    Extremity/Trunk Assessment   Upper Extremity Assessment Upper Extremity Assessment: Overall WFL for tasks assessed;Generalized weakness    Lower Extremity Assessment Lower Extremity Assessment: LLE deficits/detail LLE Deficits / Details: pt with difficulty maintaining TDWB        Communication   Communication: No difficulties  Cognition Arousal/Alertness: Awake/alert Behavior During Therapy: Anxious;Flat affect Overall Cognitive Status: Impaired/Different from baseline Area of Impairment: Safety/judgement;Following commands;Problem solving                       Following Commands: Follows one step commands consistently;Follows multi-step commands inconsistently;Follows multi-step commands with increased time Safety/Judgement: Decreased awareness of deficits;Decreased awareness of safety   Problem Solving: Slow processing;Difficulty sequencing;Requires verbal cues;Requires tactile cues General Comments: pt is very anxious about many aspects of his life, paying taxes on time, moving out of his house, and lastly surgery on his foot, extensive education on importance of touchdown weight bearing and protection of foot, pt with difficulty sequencing RW movement to turn despite multimodal cuing       General Comments General comments (skin integrity, edema, etc.): Dressing on L foot with drainage visible, max HR  with ambulation 132 bpm,         Assessment/Plan    PT Assessment Patient needs continued PT services  PT Problem List Decreased strength;Decreased activity tolerance;Decreased balance;Decreased mobility;Decreased coordination;Decreased cognition;Decreased safety awareness;Decreased knowledge of use of DME;Decreased knowledge of precautions;Pain       PT Treatment Interventions DME instruction;Gait training;Functional mobility training;Therapeutic activities;Therapeutic exercise;Balance training;Cognitive remediation;Patient/family education    PT Goals (Current goals can be found in the Care Plan section)  Acute Rehab PT Goals Patient Stated Goal: to be able to run again PT Goal Formulation: With patient Time For Goal Achievement: 02/12/20 Potential to Achieve Goals: Good    Frequency Min 3X/week   Barriers to discharge Decreased caregiver support         AM-PAC PT "6 Clicks" Mobility  Outcome Measure Help needed turning from your back to your side while in a flat bed without using bedrails?: None Help needed moving from lying on your back to sitting on the side of a flat bed without using bedrails?: None Help needed moving to and from a bed to a chair (including a wheelchair)?: A Little Help needed standing up from a chair using your arms (e.g., wheelchair or bedside chair)?: A Little Help needed to walk in hospital room?: A Little Help needed climbing 3-5 steps with a railing? : Total 6 Click Score: 18    End of Session Equipment Utilized During Treatment: Gait belt Activity Tolerance: Patient limited by pain;Patient limited by fatigue Patient left: in bed;with call bell/phone within reach Nurse Communication: Mobility status;Patient requests pain meds PT Visit Diagnosis: Unsteadiness on feet (R26.81);Other abnormalities of gait and mobility (R26.89);Muscle weakness (generalized) (M62.81);Difficulty  in walking, not elsewhere classified (R26.2);Pain Pain - Right/Left:  Left Pain - part of body: Ankle and joints of foot    Time: [id number] PT Time Calculation (min) (ACUTE ONLY): 27 min   Charges:   PT Evaluation $PT Eval Moderate Complexity: 1 Mod PT Treatments $Gait Training: 8-22 mins        Wylie Coon B. Migdalia Dk PT, DPT Acute Rehabilitation Services Pager [phone] Office [phone]   [place] 01/29/2020, 4:25 PM

## 2020-01-29 NOTE — H&P (View-Only) (Signed)
ORTHOPAEDIC CONSULTATION  REQUESTING PHYSICIAN: Mercy Riding, MD  Chief Complaint: Wound dehiscence and drainage left foot status post first and second ray amputation.  HPI: Leonard Bentley is a 59 y.o. male who presents with wound dehiscence from a first and second ray amputation.  Patient states that he was trying to discharge home as his brothers so he would not have assistance at home.  Patient states that his brother would not take him home he states that he did discharge to independent living at his house and he states he did have to walk on his foot and was not able to be nonweightbearing or partial weightbearing.  Past Medical History:  Diagnosis Date  . Acute pulmonary embolism ([place]) 04/11/2016  . Anxiety   . Arthritis   . Atrial tachycardia ([place]) 01/06/2016  . Depression   . DTs (delirium tremens) ([place])   . Dyspnea   . ED (erectile dysfunction)   . ETOH abuse   . Hypertension   . Lymphoma, non Hodgkin's 09/25/2011   Stage 2  . Mitral regurgitation 01/20/2016  . Mobitz I 10/09/2015  . Murmur 01/06/2016  . Occasional tremors   . PAC (premature atrial contraction) 10/09/2015  . S/P minimally-invasive mitral valve repair 10/27/2016   Complex valvuloplasty including artificial Gore-tex neochord placement x6 and 34 mm Edwards Physio II ring annuloplasty via right mini thoracotomy approach   Past Surgical History:  Procedure Laterality Date  . AMPUTATION Left 01/12/2019   Procedure: LEFT GREAT TOE AMPUTATION;  Surgeon: [name], MD;  Location: [facility];  Service: Orthopedics;  Laterality: Left;  . AMPUTATION Left 01/16/2020   Procedure: LEFT 1ST METATARSAL AND SECOND TOE AMPUTATION;  Surgeon: [name], MD;  Location: [facility];  Service: Orthopedics;  Laterality: Left;  . CARDIAC CATHETERIZATION N/A 08/13/2016   Procedure: Right/Left Heart Cath and Coronary Angiography;  Surgeon: [name], MD;  Location: [facility];  Service: Cardiovascular;  Laterality: N/A;    . MITRAL VALVE REPAIR Right 10/27/2016   Procedure: MINIMALLY INVASIVE MITRAL VALVE REPAIR (MVR) USING 34 PHYSIO II ANNULOPLASTY RING;  Surgeon: [name], MD;  Location: [facility];  Service: Open Heart Surgery;  Laterality: Right;  . SKIN BIOPSY Right 04/04/2018   right mid medial anterior tibial shave  see report in chart  . TEE WITHOUT CARDIOVERSION N/A 02/11/2016   Procedure: TRANSESOPHAGEAL ECHOCARDIOGRAM (TEE);  Surgeon: [name], MD;  Location: [facility];  Service: Cardiovascular;  Laterality: N/A;  . TEE WITHOUT CARDIOVERSION N/A 10/27/2016   Procedure: TRANSESOPHAGEAL ECHOCARDIOGRAM (TEE);  Surgeon: [name], MD;  Location: [facility];  Service: Open Heart Surgery;  Laterality: N/A;  . TRIGGER FINGER RELEASE Bilateral    Social History   Socioeconomic History  . Marital status: Single    Spouse name: Not on file  . Number of children: Not on file  . Years of education: Not on file  . Highest education level: Not on file  Occupational History  . Occupation: unemployed  Tobacco Use  . Smoking status: Current Every [place] Smoker    Packs/[place]: 1.00    Years: 40.00    Pack years: 40.00    Types: Cigarettes, E-cigarettes  . Smokeless tobacco: Former Systems developer    Quit date: 1985  . Tobacco comment: vapes all [place]  Substance and Sexual Activity  . Alcohol use: Yes    Alcohol/week: 0.0 standard drinks    Comment: 1-2 bottles of red wine a [place]  .  Drug use: No  . Sexual activity: Not Currently  Other Topics Concern  . Not on file  Social History Narrative   Epworth sleepiness scale as of 10/09/15 a 1   Social Determinants of Health   Financial Resource Strain:   . Difficulty of Paying Living Expenses:   Food Insecurity:   . Worried About Charity fundraiser in the Last Year:   . Arboriculturist in the Last Year:   Transportation Needs:   . Film/video editor (Medical):   Marland Kitchen Lack of Transportation (Non-Medical):   Physical Activity:   . Days of Exercise per  Week:   . Minutes of Exercise per Session:   Stress:   . Feeling of Stress :   Social Connections:   . Frequency of Communication with Friends and Family:   . Frequency of Social Gatherings with Friends and Family:   . Attends Religious Services:   . Active Member of Clubs or Organizations:   . Attends Archivist Meetings:   Marland Kitchen Marital Status:    Family History  Problem Relation Age of Onset  . Cancer Mother        BREAST(BONE)  . Cancer Father        PANCREATIC  . Hypertension Maternal Grandmother   . Stroke Maternal Aunt   . Heart attack Neg Hx    - negative except otherwise stated in the family history section Allergies  Allergen Reactions  . Other Other (See Comments)    "Cactus" caused blisters on tongue (if prepared as food)   Prior to Admission medications   Medication Sig Start Date End Date Taking? Authorizing Provider  enoxaparin (LOVENOX) 150 MG/ML injection Inject 1 mL (150 mg total) into the skin daily for 7 doses. 01/17/20 01/27/20 Yes Nita Sells, MD  metoprolol succinate (TOPROL-XL) 50 MG 24 hr tablet Take 1 tablet (50 mg total) by mouth daily. Take with or immediately following a meal. Patient taking differently: Take 50 mg by mouth at bedtime. Take with or immediately following a meal. 01/17/20  Yes Samtani, Darylene Price, MD  nicotine (NICODERM CQ - DOSED IN MG/24 HOURS) 21 mg/24hr patch Place 1 patch (21 mg total) onto the skin daily. 01/17/20  Yes Nita Sells, MD  oxyCODONE (OXY IR/ROXICODONE) 5 MG immediate release tablet Take 1 tablet (5 mg total) by mouth every 6 (six) hours as needed for moderate pain or breakthrough pain (Hold & Call MD if SBP<90, HR<65, RR<10, O2<90, or altered mental status.). 01/24/20  Yes [name], MD  warfarin (COUMADIN) 5 MG tablet Take 1 tablet by mouth daily. Follow[facility]up with [facility] for INR and dosing instructions Patient taking differently: Take 5 mg by mouth at bedtime. Take 1 tablet by mouth  daily. Follow-up with [facility] for INR and dosing instructions 01/17/20  Yes Nita Sells, MD  acetaminophen (TYLENOL) 325 MG tablet Take 2 tablets (650 mg total) by mouth every 6 (six) hours as needed for mild pain (or Fever >/= 101). Patient not taking: Reported on 01/27/2020 01/17/20   Nita Sells, MD   DG Chest Portable 1 View  Result Date: 01/27/2020 CLINICAL DATA:  Shortness of breath. EXAM: PORTABLE CHEST 1 VIEW COMPARISON:  January 11, 2020 FINDINGS: The heart size and mediastinal contours are within normal limits. Both lungs are clear. The visualized skeletal structures are unremarkable. Again identified are innumerable bilateral pulmonary nodules, stable across prior studies. IMPRESSION: No active disease. Electronically Signed   By: Jamie Kato.D.  On: 01/27/2020 17:30   DG Foot Complete Left  Result Date: 01/27/2020 CLINICAL DATA:  Pain and bleeding from surgical site EXAM: LEFT FOOT - COMPLETE 3+ VIEW COMPARISON:  January 11, 2020 FINDINGS: The patient has undergone interval amputation through the distal first and second metatarsals. There is a displaced fracture of the third metatarsal head with a fracture fragment measuring approximately 1.5 cm. There is soft tissue swelling about the foot. IMPRESSION: 1. New displaced fracture through the third metatarsal head. This may represent a pathologic fracture secondary to underlying osteomyelitis. 2. Status post interval amputation of the first and second metatarsal heads. 3. There is nonspecific soft tissue swelling about the foot. Electronically Signed   By: Constance Holster M.D.   On: 01/27/2020 17:39   VAS [place] ABI WITH/WO TBI  Result Date: 01/28/2020 LOWER EXTREMITY DOPPLER STUDY Indications: Ulceration, and gangrene. High Risk Factors: Hypertension.  Vascular               01/16/2020 - LEFT 1ST METATARSAL AND SECOND TOE Interventions:         AMPUTATION. Comparison Study: No prior studies. Performing Technologist:  Carlos Levering RVT  Examination Guidelines: A complete evaluation includes at minimum, Doppler waveform signals and systolic blood pressure reading at the level of bilateral brachial, anterior tibial, and posterior tibial arteries, when vessel segments are accessible. Bilateral testing is considered an integral part of a complete examination. Photoelectric Plethysmograph (PPG) waveforms and toe systolic pressure readings are included as required and additional duplex testing as needed. Limited examinations for reoccurring indications may be performed as noted.  ABI Findings: +---------+------------------+-----+---------+--------+ Right    Rt Pressure (mmHg)IndexWaveform Comment  +---------+------------------+-----+---------+--------+ Brachial 154                    triphasic         +---------+------------------+-----+---------+--------+ PTA      163               1.06 biphasic          +---------+------------------+-----+---------+--------+ DP       161               1.05 biphasic          +---------+------------------+-----+---------+--------+ Great Toe68                0.44                   +---------+------------------+-----+---------+--------+ +---------+------------------+-----+---------+----------+ Left     Lt Pressure (mmHg)IndexWaveform Comment    +---------+------------------+-----+---------+----------+ Brachial 150                    triphasic           +---------+------------------+-----+---------+----------+ PTA      159               1.03 triphasic           +---------+------------------+-----+---------+----------+ DP       158               1.03 triphasic           +---------+------------------+-----+---------+----------+ Great Toe                                Amputation +---------+------------------+-----+---------+----------+ +-------+-----------+-----------+------------+------------+ ABI/TBIToday's ABIToday's TBIPrevious ABIPrevious  TBI +-------+-----------+-----------+------------+------------+ Right  1.06       0.44                                +-------+-----------+-----------+------------+------------+  Left   1.03                                           +-------+-----------+-----------+------------+------------+  Summary: Right: Resting right ankle-brachial index is within normal range. No evidence of significant right lower extremity arterial disease. The right toe-brachial index is abnormal. Left: Resting left ankle-brachial index is within normal range. No evidence of significant left lower extremity arterial disease. Unable to obtain TBI due to great toe amputation.  *See table(s) above for measurements and observations.  Electronically signed by Curt Jews MD on 01/28/2020 at 3:35:34 PM.    Final    - pertinent xrays, CT, MRI studies were reviewed and independently interpreted  Positive ROS: All other systems have been reviewed and were otherwise negative with the exception of those mentioned in the HPI and as above.  Physical Exam: General: Alert, no acute distress Psychiatric: Patient is competent for consent with normal mood and affect Lymphatic: No axillary or cervical lymphadenopathy Cardiovascular: No pedal edema Respiratory: No cyanosis, no use of accessory musculature GI: No organomegaly, abdomen is soft and non-tender    Images:  @ENCIMAGES @  Labs:  Lab Results  Component Value Date   HGBA1C 5.2 10/25/2016   HGBA1C 6.1 (H) 04/03/2016   HGBA1C 5.9 (H) 08/18/2015   ESRSEDRATE 14 01/28/2020   ESRSEDRATE 7 01/12/2020   CRP 1.2 (H) 01/28/2020   CRP 0.8 01/12/2020   LABURIC 5.2 12/09/2018   LABURIC 7.4 08/18/2015   LABURIC 4.1 12/01/2011   REPTSTATUS PENDING 01/27/2020   REPTSTATUS PENDING 01/27/2020   CULT NO GROWTH 2 DAYS 01/27/2020   CULT NO GROWTH 2 DAYS 01/27/2020    Lab Results  Component Value Date   ALBUMIN 2.9 (L) 01/29/2020   ALBUMIN 3.2 (L) 01/28/2020   ALBUMIN  3.6 01/27/2020   LABURIC 5.2 12/09/2018   LABURIC 7.4 08/18/2015   LABURIC 4.1 12/01/2011    Neurologic: Patient does not have protective sensation bilateral lower extremities.   MUSCULOSKELETAL:   Skin: Examination patient has wound dehiscence over the second metatarsal.  There is swelling there is a draining hematoma.  The wound dehiscence extends down to bone.  Patient has a strong dorsalis pedis pulse bilaterally ankle-brachial indices confirms that patient has good circulation.  Review of the radiographs shows a stress fracture through the third metatarsal head secondary to patient's increased weightbearing.  Assessment: Assessment: Wound dehiscence left foot status post first and second ray amputation with stress fracture through the third metatarsal head.  Plan: Plan: We will plan for revision surgery with amputation of the third toe debridement of the wound further debridement of the bone and wound closure.  Patient has requested discharge to skilled nursing so he could be compliant with nonweightbearing on the left foot.  Thank you for the consult and the opportunity to see Mr. Hartington, MD [facility] [phone] 6:41 AM

## 2020-01-29 NOTE — Consult Note (Signed)
ORTHOPAEDIC CONSULTATION  REQUESTING PHYSICIAN: Mercy Riding, MD  Chief Complaint: Wound dehiscence and drainage left foot status post first and second ray amputation.  HPI: Leonard Bentley is a 59 y.o. male who presents with wound dehiscence from a first and second ray amputation.  Patient states that he was trying to discharge home as his brothers so he would not have assistance at home.  Patient states that his brother would not take him home he states that he did discharge to independent living at his house and he states he did have to walk on his foot and was not able to be nonweightbearing or partial weightbearing.  Past Medical History:  Diagnosis Date  . Acute pulmonary embolism ([place]) 04/11/2016  . Anxiety   . Arthritis   . Atrial tachycardia ([place]) 01/06/2016  . Depression   . DTs (delirium tremens) ([place])   . Dyspnea   . ED (erectile dysfunction)   . ETOH abuse   . Hypertension   . Lymphoma, non Hodgkin's 09/25/2011   Stage 2  . Mitral regurgitation 01/20/2016  . Mobitz I 10/09/2015  . Murmur 01/06/2016  . Occasional tremors   . PAC (premature atrial contraction) 10/09/2015  . S/P minimally-invasive mitral valve repair 10/27/2016   Complex valvuloplasty including artificial Gore-tex neochord placement x6 and 34 mm Edwards Physio II ring annuloplasty via right mini thoracotomy approach   Past Surgical History:  Procedure Laterality Date  . AMPUTATION Left 01/12/2019   Procedure: LEFT GREAT TOE AMPUTATION;  Surgeon: [name], MD;  Location: [facility];  Service: Orthopedics;  Laterality: Left;  . AMPUTATION Left 01/16/2020   Procedure: LEFT 1ST METATARSAL AND SECOND TOE AMPUTATION;  Surgeon: [name], MD;  Location: [facility];  Service: Orthopedics;  Laterality: Left;  . CARDIAC CATHETERIZATION N/A 08/13/2016   Procedure: Right/Left Heart Cath and Coronary Angiography;  Surgeon: [name], MD;  Location: [facility];  Service: Cardiovascular;  Laterality: N/A;    . MITRAL VALVE REPAIR Right 10/27/2016   Procedure: MINIMALLY INVASIVE MITRAL VALVE REPAIR (MVR) USING 34 PHYSIO II ANNULOPLASTY RING;  Surgeon: [name], MD;  Location: [facility];  Service: Open Heart Surgery;  Laterality: Right;  . SKIN BIOPSY Right 04/04/2018   right mid medial anterior tibial shave  see report in chart  . TEE WITHOUT CARDIOVERSION N/A 02/11/2016   Procedure: TRANSESOPHAGEAL ECHOCARDIOGRAM (TEE);  Surgeon: [name], MD;  Location: [facility];  Service: Cardiovascular;  Laterality: N/A;  . TEE WITHOUT CARDIOVERSION N/A 10/27/2016   Procedure: TRANSESOPHAGEAL ECHOCARDIOGRAM (TEE);  Surgeon: [name], MD;  Location: [facility];  Service: Open Heart Surgery;  Laterality: N/A;  . TRIGGER FINGER RELEASE Bilateral    Social History   Socioeconomic History  . Marital status: Single    Spouse name: Not on file  . Number of children: Not on file  . Years of education: Not on file  . Highest education level: Not on file  Occupational History  . Occupation: unemployed  Tobacco Use  . Smoking status: Current Every [place] Smoker    Packs/[place]: 1.00    Years: 40.00    Pack years: 40.00    Types: Cigarettes, E-cigarettes  . Smokeless tobacco: Former Systems developer    Quit date: 1985  . Tobacco comment: vapes all [place]  Substance and Sexual Activity  . Alcohol use: Yes    Alcohol/week: 0.0 standard drinks    Comment: 1-2 bottles of red wine a [place]  .  Drug use: No  . Sexual activity: Not Currently  Other Topics Concern  . Not on file  Social History Narrative   Epworth sleepiness scale as of 10/09/15 a 1   Social Determinants of Health   Financial Resource Strain:   . Difficulty of Paying Living Expenses:   Food Insecurity:   . Worried About Charity fundraiser in the Last Year:   . Arboriculturist in the Last Year:   Transportation Needs:   . Film/video editor (Medical):   Marland Kitchen Lack of Transportation (Non-Medical):   Physical Activity:   . Days of Exercise per  Week:   . Minutes of Exercise per Session:   Stress:   . Feeling of Stress :   Social Connections:   . Frequency of Communication with Friends and Family:   . Frequency of Social Gatherings with Friends and Family:   . Attends Religious Services:   . Active Member of Clubs or Organizations:   . Attends Archivist Meetings:   Marland Kitchen Marital Status:    Family History  Problem Relation Age of Onset  . Cancer Mother        BREAST(BONE)  . Cancer Father        PANCREATIC  . Hypertension Maternal Grandmother   . Stroke Maternal Aunt   . Heart attack Neg Hx    - negative except otherwise stated in the family history section Allergies  Allergen Reactions  . Other Other (See Comments)    "Cactus" caused blisters on tongue (if prepared as food)   Prior to Admission medications   Medication Sig Start Date End Date Taking? Authorizing Provider  enoxaparin (LOVENOX) 150 MG/ML injection Inject 1 mL (150 mg total) into the skin daily for 7 doses. 01/17/20 01/27/20 Yes Nita Sells, MD  metoprolol succinate (TOPROL-XL) 50 MG 24 hr tablet Take 1 tablet (50 mg total) by mouth daily. Take with or immediately following a meal. Patient taking differently: Take 50 mg by mouth at bedtime. Take with or immediately following a meal. 01/17/20  Yes Samtani, Darylene Price, MD  nicotine (NICODERM CQ - DOSED IN MG/24 HOURS) 21 mg/24hr patch Place 1 patch (21 mg total) onto the skin daily. 01/17/20  Yes Nita Sells, MD  oxyCODONE (OXY IR/ROXICODONE) 5 MG immediate release tablet Take 1 tablet (5 mg total) by mouth every 6 (six) hours as needed for moderate pain or breakthrough pain (Hold & Call MD if SBP<90, HR<65, RR<10, O2<90, or altered mental status.). 01/24/20  Yes [name], MD  warfarin (COUMADIN) 5 MG tablet Take 1 tablet by mouth daily. Follow-up with [facility] for INR and dosing instructions Patient taking differently: Take 5 mg by mouth at bedtime. Take 1 tablet by mouth  daily. Follow-up with [facility] for INR and dosing instructions 01/17/20  Yes Nita Sells, MD  acetaminophen (TYLENOL) 325 MG tablet Take 2 tablets (650 mg total) by mouth every 6 (six) hours as needed for mild pain (or Fever >/= 101). Patient not taking: Reported on 01/27/2020 01/17/20   Nita Sells, MD   DG Chest Portable 1 View  Result Date: 01/27/2020 CLINICAL DATA:  Shortness of breath. EXAM: PORTABLE CHEST 1 VIEW COMPARISON:  January 11, 2020 FINDINGS: The heart size and mediastinal contours are within normal limits. Both lungs are clear. The visualized skeletal structures are unremarkable. Again identified are innumerable bilateral pulmonary nodules, stable across prior studies. IMPRESSION: No active disease. Electronically Signed   By: Jamie Kato.D.  On: 01/27/2020 17:30   DG Foot Complete Left  Result Date: 01/27/2020 CLINICAL DATA:  Pain and bleeding from surgical site EXAM: LEFT FOOT - COMPLETE 3+ VIEW COMPARISON:  January 11, 2020 FINDINGS: The patient has undergone interval amputation through the distal first and second metatarsals. There is a displaced fracture of the third metatarsal head with a fracture fragment measuring approximately 1.5 cm. There is soft tissue swelling about the foot. IMPRESSION: 1. New displaced fracture through the third metatarsal head. This may represent a pathologic fracture secondary to underlying osteomyelitis. 2. Status post interval amputation of the first and second metatarsal heads. 3. There is nonspecific soft tissue swelling about the foot. Electronically Signed   By: Constance Holster M.D.   On: 01/27/2020 17:39   VAS [place] ABI WITH/WO TBI  Result Date: 01/28/2020 LOWER EXTREMITY DOPPLER STUDY Indications: Ulceration, and gangrene. High Risk Factors: Hypertension.  Vascular               01/16/2020 - LEFT 1ST METATARSAL AND SECOND TOE Interventions:         AMPUTATION. Comparison Study: No prior studies. Performing Technologist:  Carlos Levering RVT  Examination Guidelines: A complete evaluation includes at minimum, Doppler waveform signals and systolic blood pressure reading at the level of bilateral brachial, anterior tibial, and posterior tibial arteries, when vessel segments are accessible. Bilateral testing is considered an integral part of a complete examination. Photoelectric Plethysmograph (PPG) waveforms and toe systolic pressure readings are included as required and additional duplex testing as needed. Limited examinations for reoccurring indications may be performed as noted.  ABI Findings: +---------+------------------+-----+---------+--------+ Right    Rt Pressure (mmHg)IndexWaveform Comment  +---------+------------------+-----+---------+--------+ Brachial 154                    triphasic         +---------+------------------+-----+---------+--------+ PTA      163               1.06 biphasic          +---------+------------------+-----+---------+--------+ DP       161               1.05 biphasic          +---------+------------------+-----+---------+--------+ Great Toe68                0.44                   +---------+------------------+-----+---------+--------+ +---------+------------------+-----+---------+----------+ Left     Lt Pressure (mmHg)IndexWaveform Comment    +---------+------------------+-----+---------+----------+ Brachial 150                    triphasic           +---------+------------------+-----+---------+----------+ PTA      159               1.03 triphasic           +---------+------------------+-----+---------+----------+ DP       158               1.03 triphasic           +---------+------------------+-----+---------+----------+ Great Toe                                Amputation +---------+------------------+-----+---------+----------+ +-------+-----------+-----------+------------+------------+ ABI/TBIToday's ABIToday's TBIPrevious ABIPrevious  TBI +-------+-----------+-----------+------------+------------+ Right  1.06       0.44                                +-------+-----------+-----------+------------+------------+  Left   1.03                                           +-------+-----------+-----------+------------+------------+  Summary: Right: Resting right ankle-brachial index is within normal range. No evidence of significant right lower extremity arterial disease. The right toe-brachial index is abnormal. Left: Resting left ankle-brachial index is within normal range. No evidence of significant left lower extremity arterial disease. Unable to obtain TBI due to great toe amputation.  *See table(s) above for measurements and observations.  Electronically signed by Curt Jews MD on 01/28/2020 at 3:35:34 PM.    Final    - pertinent xrays, CT, MRI studies were reviewed and independently interpreted  Positive ROS: All other systems have been reviewed and were otherwise negative with the exception of those mentioned in the HPI and as above.  Physical Exam: General: Alert, no acute distress Psychiatric: Patient is competent for consent with normal mood and affect Lymphatic: No axillary or cervical lymphadenopathy Cardiovascular: No pedal edema Respiratory: No cyanosis, no use of accessory musculature GI: No organomegaly, abdomen is soft and non-tender    Images:  @ENCIMAGES @  Labs:  Lab Results  Component Value Date   HGBA1C 5.2 10/25/2016   HGBA1C 6.1 (H) 04/03/2016   HGBA1C 5.9 (H) 08/18/2015   ESRSEDRATE 14 01/28/2020   ESRSEDRATE 7 01/12/2020   CRP 1.2 (H) 01/28/2020   CRP 0.8 01/12/2020   LABURIC 5.2 12/09/2018   LABURIC 7.4 08/18/2015   LABURIC 4.1 12/01/2011   REPTSTATUS PENDING 01/27/2020   REPTSTATUS PENDING 01/27/2020   CULT NO GROWTH 2 DAYS 01/27/2020   CULT NO GROWTH 2 DAYS 01/27/2020    Lab Results  Component Value Date   ALBUMIN 2.9 (L) 01/29/2020   ALBUMIN 3.2 (L) 01/28/2020   ALBUMIN  3.6 01/27/2020   LABURIC 5.2 12/09/2018   LABURIC 7.4 08/18/2015   LABURIC 4.1 12/01/2011    Neurologic: Patient does not have protective sensation bilateral lower extremities.   MUSCULOSKELETAL:   Skin: Examination patient has wound dehiscence over the second metatarsal.  There is swelling there is a draining hematoma.  The wound dehiscence extends down to bone.  Patient has a strong dorsalis pedis pulse bilaterally ankle-brachial indices confirms that patient has good circulation.  Review of the radiographs shows a stress fracture through the third metatarsal head secondary to patient's increased weightbearing.  Assessment: Assessment: Wound dehiscence left foot status post first and second ray amputation with stress fracture through the third metatarsal head.  Plan: Plan: We will plan for revision surgery with amputation of the third toe debridement of the wound further debridement of the bone and wound closure.  Patient has requested discharge to skilled nursing so he could be compliant with nonweightbearing on the left foot.  Thank you for the consult and the opportunity to see Mr. [place], MD [facility] [phone] 6:41 AM

## 2020-01-29 NOTE — Progress Notes (Signed)
PROGRESS NOTE  Leonard Bentley VHQ:[id number] DOB: Sep 25, 1961   PCP: [name], NP  Patient is from: Home.  Lives alone.  Uses cane and walker since he was left foot surgery on 01/16/2020.  DOA: 01/27/2020 LOS: 2  Brief Narrative / Interim history: 59 year old male with history of left foot osteomyelitis status post first and second left ray amputation on 01/16/2020 by Dr. [name], combined CHF with last EF improved to 55/60%, mitral valve repair, history of PE on warfarin, stage II NHL in remission, EtOH abuse and tobacco abuse presenting with increased pain and drainage from left foot.  No fever or trauma involved.  Patient was discharged on 3/12.  Not on antibiotic.  In ED, HDS on arrival but became tachycardic to 120s.  100% on room air.  Afebrile.  WBC 3.2.  Anion gap 17.  Otherwise, CBC with differential and CMP without significant finding.  Lactic acid 4.9> 2.7.  HS Trop negative x2.  EKG NSR.  Influenza PCR and COVID-19 negative.  INR 3.8.  CXR without acute finding.  DG left foot with new displaced fracture through the third metatarsal head concerning for pathologic fracture secondary to underlying osteomyelitis, and nonspecific soft tissue swelling about the foot.  Patient received IV vitamin K 2.5 and empiric antibiotics and admitted.  Reportedly orthopedic surgery, Dr. [name] consulted.   Bilateral ABI without PAD.  Plan for revision surgery with amputation of the third toe and debridement of the wound on 3/24.   Subjective: Seen and examined earlier this morning.  No major events overnight or this morning.  No complaints.  Pain improved.  Denies chest pain, dyspnea, GI or UTI symptoms.  On board with the plan for surgery tomorrow.  Objective: Vitals:   01/29/20 0000 01/29/20 0402 01/29/20 0725 01/29/20 1153  BP: (!) 132/94 (!) 139/102 (!) 131/98 (!) 121/96  Pulse: 71 73 76 91  Resp: 19 (!) '21 15 19  '$ Temp: 98.5 F (36.9 C) 98.2 F (36.8 C) 98.5 F (36.9 C) (!) 97.3 F (36.3 C)   TempSrc: Oral Oral Oral Oral  SpO2: 100% 100% 97% 92%  Weight:  90.6 kg      Intake/Output Summary (Last 24 hours) at 01/29/2020 1459 Last data filed at 01/29/2020 1100 Gross per 24 hour  Intake 1051.55 ml  Output 650 ml  Net 401.55 ml   Filed Weights   01/29/20 0402  Weight: 90.6 kg    Examination:  GENERAL: No acute distress.  Appears well.  HEENT: MMM.  Vision and hearing grossly intact.  NECK: Supple.  No apparent JVD.  RESP:  No IWOB. Good air movement bilaterally. CVS:  RRR. Heart sounds normal.  ABD/GI/GU: Bowel sounds present. Soft. Non tender.  MSK/EXT:  Moves extremities.  Dark discoloration over the tip of his first and second right toes. SKIN: Dressing over left foot and DCI. NEURO: Awake, alert and oriented appropriately.  BUE tremors.  No apparent focal neuro deficit. PSYCH: Calm. Normal affect.    Procedures:  None.  Assessment & Plan: Osteomyelitis and pathologic fracture of left third metatarsal bone-CRP minimally elevated.  ESR within normal. Recent first and second ray amputation of left foot Sepsis due to osteomyelitis-sepsis physiology which could due to alcohol has resolved except for leukopenia. Bleeding from recent surgical wound-likely due to supratherapeutic INR.  Received vitamin K.  Anticoagulation on hold. -COVID-19, influenza PCR and blood cultures negative -Lactic acidosis resolved.  Procalcitonin negative. -Continue vancomycin and ceftriaxone -Plan for surgical intervention on 3/24 by  Dr. [name].  Dark skin discoloration over right first and second toe-bilateral ABI without PAD.  No signs of infection. -Continue monitoring  Chronic diastolic CHF-previously with combined CHF but recovered LV function on echocardiogram in 08/2018.  No acute cardiopulmonary symptoms.  Does not seem to be on diuretics. -Monitor fluid status  History of PE on warfarin/supratherapeutic INR-INR 3.8> IV vit K 2.5 mg> 2.6 -Continue holding warfarin -May need  additional vitamin K for surgery tomorrow  Mitra valve repair with annular ring prosthesis-stable.  Tachycardia/essential hypertension/history of SVT/PAT/PACs-due to withdrawal?  Resolving. -Continue home metoprolol  Alcohol abuse-reports drinking a bottle of wine a [place].  Last drink was the [place] prior to admission.  BUE tremors -Encouraged cessation -Consult CSW for resources-previously tried AA and rehab at [facility] -Continue CIWA with as needed Ativan -Continue vitamins  Tobacco use disorder: Reports smoking about a pack a [place]. -Encourage cessation -Nicotine patch  Lactic acidosis-likely due to alcohol versus sepsis.  Resolved.  Anion gap: Likely due to lactic acidosis and alcohol.  Resolved.  History of stage II NHL in remission- -outpatient follow-up.  Hypomagnesemia-likely due to alcohol. -Replenish and recheck  Leukopenia-likely due to alcohol. -Continue monitoring  Normocytic anemia: Hgb 10.9, at baseline. -Check anemia panel       Pressure Injury 01/28/20 Buttocks Right Stage 1 -  Intact skin with non-blanchable redness of a localized area usually over a bony prominence. (Active)  01/28/20 1629  Location: Buttocks  Location Orientation: Right  Staging: Stage 1 -  Intact skin with non-blanchable redness of a localized area usually over a bony prominence.  Wound Description (Comments):   Present on Admission: Yes               DVT prophylaxis: SCD Code Status: Full code Family Communication: Patient and/or RN. Available if any question.   Discharge barrier: Left foot fracture/osteomyelitis requiring IV antibiotics.  Plan for surgical debridement 2/24 Patient is from: Home Final disposition: To be determined after surgery and therapy evaluation  Consultants: Orthopedic surgery   Microbiology summarized: COVID-19 negative Influenza PCR negative Blood cultures negative  Sch Meds:  Scheduled Meds: . folic acid  1 mg Oral Daily  . LORazepam   0-4 mg Intravenous Q6H   Followed by  . LORazepam  0-4 mg Intravenous Q12H  . metoprolol tartrate  25 mg Oral BID  . multivitamin with minerals  1 tablet Oral Daily  . nicotine  14 mg Transdermal Daily  . thiamine  100 mg Oral Daily   Or  . thiamine  100 mg Intravenous Daily   Continuous Infusions: . cefTRIAXone (ROCEPHIN)  IV 2 g (01/28/20 1835)  . vancomycin Stopped (01/29/20 0932)   PRN Meds:.acetaminophen **OR** acetaminophen, LORazepam **OR** LORazepam, morphine injection, ondansetron **OR** ondansetron (ZOFRAN) IV  Antimicrobials: Anti-infectives (From admission, onward)   Start     Dose/Rate Route Frequency Ordered Stop   01/28/20 1800  cefTRIAXone (ROCEPHIN) 2 g in sodium chloride 0.9 % 100 mL IVPB     2 g 200 mL/hr over 30 Minutes Intravenous Every 24 hours 01/27/20 2211     01/28/20 0600  vancomycin (VANCOCIN) IVPB 1000 mg/200 mL premix     1,000 mg 200 mL/hr over 60 Minutes Intravenous Every 12 hours 01/27/20 1738     01/27/20 1730  vancomycin (VANCOCIN) IVPB 1000 mg/200 mL premix  Status:  Discontinued     1,000 mg 200 mL/hr over 60 Minutes Intravenous  Once 01/27/20 1725 01/27/20 1726   01/27/20 1730  cefTRIAXone (ROCEPHIN) 2 g in sodium chloride 0.9 % 100 mL IVPB     2 g 200 mL/hr over 30 Minutes Intravenous  Once 01/27/20 1725 01/27/20 2010   01/27/20 1730  vancomycin (VANCOREADY) IVPB 2000 mg/400 mL     2,000 mg 200 mL/hr over 120 Minutes Intravenous  Once 01/27/20 1726 01/27/20 2219       I have personally reviewed the following labs and images: CBC: Recent Labs  Lab 01/27/20 1524 01/28/20 0800 01/29/20 0057  WBC 3.2* 2.7* 2.8*  NEUTROABS 2.1  --   --   HGB 13.3 12.2* 10.9*  HCT 41.3 37.9* 33.5*  MCV 82.9 84.8 81.7  PLT 273 224 202   BMP &GFR Recent Labs  Lab 01/27/20 1524 01/28/20 0800 01/29/20 0057  NA 136 137 135  K 4.7 4.4 4.4  CL 94* 97* 100  CO2 '25 26 27  '$ GLUCOSE 107* 98 126*  BUN <5* 6 7  CREATININE 0.85 0.91 0.87  CALCIUM 9.3  8.9 8.7*  MG  --  1.9 1.5*  PHOS  --  3.9 3.4   Estimated Creatinine Clearance: 100.5 mL/min (by C-G formula based on SCr of 0.87 mg/dL). Liver & Pancreas: Recent Labs  Lab 01/27/20 1524 01/28/20 0800 01/29/20 0057  AST 56* 43*  --   ALT 42 32  --   ALKPHOS 60 50  --   BILITOT 0.9 1.1  --   PROT 7.1 6.2*  --   ALBUMIN 3.6 3.2* 2.9*   No results for input(s): LIPASE, AMYLASE in the last 168 hours. No results for input(s): AMMONIA in the last 168 hours. Diabetic: No results for input(s): HGBA1C in the last 72 hours. Recent Labs  Lab 01/28/20 2121  GLUCAP 156*   Cardiac Enzymes: No results for input(s): CKTOTAL, CKMB, CKMBINDEX, TROPONINI in the last 168 hours. No results for input(s): PROBNP in the last 8760 hours. Coagulation Profile: Recent Labs  Lab 01/23/20 1513 01/27/20 1752 01/28/20 0800  INR 1.9* 3.8* 2.6*   Thyroid Function Tests: No results for input(s): TSH, T4TOTAL, FREET4, T3FREE, THYROIDAB in the last 72 hours. Lipid Profile: No results for input(s): CHOL, HDL, LDLCALC, TRIG, CHOLHDL, LDLDIRECT in the last 72 hours. Anemia Panel: No results for input(s): VITAMINB12, FOLATE, FERRITIN, TIBC, IRON, RETICCTPCT in the last 72 hours. Urine analysis:    Component Value Date/Time   COLORURINE YELLOW 01/27/2020 1841   APPEARANCEUR CLEAR 01/27/2020 1841   APPEARANCEUR Clear 08/03/2019 1143   LABSPEC 1.015 01/27/2020 1841   PHURINE 6.0 01/27/2020 1841   GLUCOSEU NEGATIVE 01/27/2020 1841   HGBUR SMALL (A) 01/27/2020 1841   BILIRUBINUR NEGATIVE 01/27/2020 1841   BILIRUBINUR small (A) 08/03/2019 1144   BILIRUBINUR Negative 08/03/2019 1143   KETONESUR 5 (A) 01/27/2020 1841   PROTEINUR 30 (A) 01/27/2020 1841   UROBILINOGEN 1.0 08/03/2019 1144   UROBILINOGEN 1.0 01/25/2013 0825   NITRITE NEGATIVE 01/27/2020 1841   LEUKOCYTESUR NEGATIVE 01/27/2020 1841   Sepsis Labs: Invalid input(s): PROCALCITONIN, [place]  Microbiology: Recent Results (from the past  240 hour(s))  Blood culture (routine x 2)     Status: None (Preliminary result)   Collection Time: 01/27/20  6:07 PM   Specimen: BLOOD RIGHT ARM  Result Value Ref Range Status   Specimen Description BLOOD RIGHT ARM  Final   Special Requests   Final    BOTTLES DRAWN AEROBIC AND ANAEROBIC Blood Culture results may not be optimal due to an excessive volume of blood received in culture bottles  Culture NO GROWTH 2 DAYS  Final   Report Status PENDING  Incomplete  Blood culture (routine x 2)     Status: None (Preliminary result)   Collection Time: 01/27/20  6:07 PM   Specimen: BLOOD LEFT HAND  Result Value Ref Range Status   Specimen Description BLOOD LEFT HAND  Final   Special Requests   Final    BOTTLES DRAWN AEROBIC AND ANAEROBIC Blood Culture results may not be optimal due to an excessive volume of blood received in culture bottles   Culture NO GROWTH 2 DAYS  Final   Report Status PENDING  Incomplete  Respiratory Panel by RT PCR (Flu A&B, Covid) - Nasopharyngeal Swab     Status: None   Collection Time: 01/27/20  8:09 PM   Specimen: Nasopharyngeal Swab  Result Value Ref Range Status   SARS Coronavirus 2 by RT PCR NEGATIVE NEGATIVE Final    Comment: (NOTE) SARS-CoV-2 target nucleic acids are NOT DETECTED. The SARS-CoV-2 RNA is generally detectable in upper respiratoy specimens during the acute phase of infection. The lowest concentration of SARS-CoV-2 viral copies this assay can detect is 131 copies/mL. A negative result does not preclude SARS-Cov-2 infection and should not be used as the sole basis for treatment or other patient management decisions. A negative result may occur with  improper specimen collection/handling, submission of specimen other than nasopharyngeal swab, presence of viral mutation(s) within the areas targeted by this assay, and inadequate number of viral copies (<131 copies/mL). A negative result must be combined with clinical observations, patient history, and  epidemiological information. The expected result is Negative. Fact Sheet for Patients:  PinkCheek.be Fact Sheet for Healthcare Providers:  GravelBags.it This test is not yet ap proved or cleared by the [place] FDA and  has been authorized for detection and/or diagnosis of SARS-CoV-2 by FDA under an Emergency Use Authorization (EUA). This EUA will remain  in effect (meaning this test can be used) for the duration of the COVID-19 declaration under Section 564(b)(1) of the Act, 21 U.S.C. section 360bbb-3(b)(1), unless the authorization is terminated or revoked sooner.    Influenza A by PCR NEGATIVE NEGATIVE Final   Influenza B by PCR NEGATIVE NEGATIVE Final    Comment: (NOTE) The Xpert Xpress SARS-CoV-2/FLU/RSV assay is intended as an aid in  the diagnosis of influenza from Nasopharyngeal swab specimens and  should not be used as a sole basis for treatment. Nasal washings and  aspirates are unacceptable for Xpert Xpress SARS-CoV-2/FLU/RSV  testing. Fact Sheet for Patients: PinkCheek.be Fact Sheet for Healthcare Providers: GravelBags.it This test is not yet approved or cleared by the [place] FDA and  has been authorized for detection and/or diagnosis of SARS-CoV-2 by  FDA under an Emergency Use Authorization (EUA). This EUA will remain  in effect (meaning this test can be used) for the duration of the  Covid-19 declaration under Section 564(b)(1) of the Act, 21  U.S.C. section 360bbb-3(b)(1), unless the authorization is  terminated or revoked. Performed at [facility], [place] [address] [place], [place] [address]     Radiology Studies: No results found.   Tniya Bowditch T. [place]  If 7PM-7AM, please contact night-coverage [email or web address] Password [facility] 01/29/2020, 2:59 PM

## 2020-01-29 NOTE — Anesthesia Preprocedure Evaluation (Addendum)
Anesthesia Evaluation  Patient identified by MRN, date of birth, ID band Patient awake    Reviewed: Allergy & Precautions, NPO status , Patient's Chart, lab work & pertinent test results  Airway Mallampati: II  TM Distance: >3 FB Neck ROM: Full    Dental no notable dental hx. (+) Teeth Intact, Dental Advisory Given   Pulmonary shortness of breath, Current Smoker,    Pulmonary exam normal breath sounds clear to auscultation       Cardiovascular hypertension, Pt. on home beta blockers and Pt. on medications Normal cardiovascular exam+ Valvular Problems/Murmurs MR  Rhythm:Regular Rate:Normal     Neuro/Psych PSYCHIATRIC DISORDERS Anxiety Depression    GI/Hepatic negative GI ROS, (+) Hepatitis -  Endo/Other  negative endocrine ROS  Renal/GU K+ 4.4 Cr 0.87     Musculoskeletal   Abdominal   Peds  Hematology  (+) Blood dyscrasia, anemia , Hgb 10.9   Anesthesia Other Findings   Reproductive/Obstetrics                            Anesthesia Physical Anesthesia Plan  ASA: III  Anesthesia Plan: Regional   Post-op Pain Management:    Induction: Intravenous  PONV Risk Score and Plan: 1 and Treatment may vary due to age or medical condition, Ondansetron and Midazolam  Airway Management Planned: Nasal Cannula  Additional Equipment: None  Intra-op Plan:   Post-operative Plan:   Informed Consent: I have reviewed the patients History and Physical, chart, labs and discussed the procedure including the risks, benefits and alternatives for the proposed anesthesia with the patient or authorized representative who has indicated his/her understanding and acceptance.     Dental advisory given  Plan Discussed with: CRNA  Anesthesia Plan Comments:        Anesthesia Quick Evaluation

## 2020-01-30 ENCOUNTER — Encounter: Payer: Self-pay | Admitting: Licensed Clinical Social Worker

## 2020-01-30 ENCOUNTER — Encounter (HOSPITAL_COMMUNITY): Payer: Self-pay | Admitting: Internal Medicine

## 2020-01-30 ENCOUNTER — Inpatient Hospital Stay (HOSPITAL_COMMUNITY): Payer: Medicaid Other | Admitting: Anesthesiology

## 2020-01-30 ENCOUNTER — Ambulatory Visit: Payer: Self-pay | Attending: Nurse Practitioner | Admitting: Nurse Practitioner

## 2020-01-30 ENCOUNTER — Encounter (HOSPITAL_COMMUNITY)
Admission: EM | Disposition: A | Discharge status: Home / Self Care | Payer: Self-pay | Source: Home / Self Care | Attending: Internal Medicine

## 2020-01-30 DIAGNOSIS — M86272 Subacute osteomyelitis, left ankle and foot: Secondary | ICD-10-CM

## 2020-01-30 DIAGNOSIS — T8131XA Disruption of external operation (surgical) wound, not elsewhere classified, initial encounter: Secondary | ICD-10-CM

## 2020-01-30 HISTORY — PX: AMPUTATION: SHX166

## 2020-01-30 LAB — MAGNESIUM: Magnesium: 1.9 mg/dL (ref 1.7–2.4)

## 2020-01-30 LAB — CBC
HCT: 33.3 % — ABNORMAL LOW (ref 39.0–52.0)
Hemoglobin: 10.9 g/dL — ABNORMAL LOW (ref 13.0–17.0)
MCH: 26.8 pg (ref 26.0–34.0)
MCHC: 32.7 g/dL (ref 30.0–36.0)
MCV: 82 fL (ref 80.0–100.0)
Platelets: 189 10*3/uL (ref 150–400)
RBC: 4.06 MIL/uL — ABNORMAL LOW (ref 4.22–5.81)
RDW: 15.3 % (ref 11.5–15.5)
WBC: 3.2 10*3/uL — ABNORMAL LOW (ref 4.0–10.5)
nRBC: 0.6 % — ABNORMAL HIGH (ref 0.0–0.2)

## 2020-01-30 LAB — RENAL FUNCTION PANEL
Albumin: 3 g/dL — ABNORMAL LOW (ref 3.5–5.0)
Anion gap: 9 (ref 5–15)
BUN: 9 mg/dL (ref 6–20)
CO2: 27 mmol/L (ref 22–32)
Calcium: 8.7 mg/dL — ABNORMAL LOW (ref 8.9–10.3)
Chloride: 101 mmol/L (ref 98–111)
Creatinine, Ser: 0.86 mg/dL (ref 0.61–1.24)
GFR calc Af Amer: >60 mL/min (ref >60)
GFR calc non Af Amer: >60 mL/min (ref >60)
Glucose, Bld: 123 mg/dL — ABNORMAL HIGH (ref 70–99)
Phosphorus: 3 mg/dL (ref 2.5–4.6)
Potassium: 4.2 mmol/L (ref 3.5–5.1)
Sodium: 137 mmol/L (ref 135–145)

## 2020-01-30 LAB — PROTIME-INR
INR: 1.1 (ref 0.8–1.2)
Prothrombin Time: 14.5 seconds (ref 11.4–15.2)

## 2020-01-30 SURGERY — AMPUTATION, FOOT, RAY
Anesthesia: Monitor Anesthesia Care | Laterality: Left

## 2020-01-30 MED ORDER — ONDANSETRON HCL 4 MG/2ML IJ SOLN
INTRAMUSCULAR | Status: DC | PRN
  Administered 2020-01-30: 4 mg via INTRAVENOUS

## 2020-01-30 MED ORDER — DOCUSATE SODIUM 100 MG PO CAPS
100.0000 mg | ORAL_CAPSULE | Freq: Two times a day (BID) | ORAL | Status: DC
  Administered 2020-01-30 – 2020-02-24 (×47): 100 mg via ORAL
  Filled 2020-01-30 (×52): qty 1

## 2020-01-30 MED ORDER — CEFAZOLIN SODIUM-DEXTROSE 2-4 GM/100ML-% IV SOLN
2.0000 g | INTRAVENOUS | Status: DC
  Filled 2020-01-30: qty 100

## 2020-01-30 MED ORDER — LACTATED RINGERS IV SOLN: INTRAVENOUS | Status: DC

## 2020-01-30 MED ORDER — CHLORHEXIDINE GLUCONATE 4 % EX LIQD
60.0000 mL | Freq: Once | CUTANEOUS | Status: AC
  Administered 2020-01-30: 4 via TOPICAL
  Filled 2020-01-30: qty 60

## 2020-01-30 MED ORDER — SODIUM CHLORIDE 0.9 % IV SOLN: INTRAVENOUS | Status: DC

## 2020-01-30 MED ORDER — FENTANYL CITRATE (PF) 100 MCG/2ML IJ SOLN
INTRAMUSCULAR | Status: AC
  Administered 2020-01-30: 50 ug via INTRAVENOUS
  Filled 2020-01-30: qty 2

## 2020-01-30 MED ORDER — FENTANYL CITRATE (PF) 100 MCG/2ML IJ SOLN: 50.0000 ug | Freq: Once | INTRAMUSCULAR | Status: AC

## 2020-01-30 MED ORDER — LACTATED RINGERS IV SOLN: INTRAVENOUS | Status: DC | PRN

## 2020-01-30 MED ORDER — MIDAZOLAM HCL 2 MG/2ML IJ SOLN
INTRAMUSCULAR | Status: AC
  Administered 2020-01-30: 2 mg via INTRAVENOUS
  Filled 2020-01-30: qty 2

## 2020-01-30 MED ORDER — ROPIVACAINE HCL 5 MG/ML IJ SOLN
INTRAMUSCULAR | Status: DC | PRN
  Administered 2020-01-30: 30 mL via PERINEURAL

## 2020-01-30 MED ORDER — METOPROLOL TARTRATE 5 MG/5ML IV SOLN: 5.0000 mg | INTRAVENOUS | Status: DC | PRN

## 2020-01-30 MED ORDER — MIDAZOLAM HCL 2 MG/2ML IJ SOLN: 2.0000 mg | Freq: Once | INTRAMUSCULAR | Status: AC

## 2020-01-30 MED ORDER — PROPOFOL 500 MG/50ML IV EMUL
INTRAVENOUS | Status: DC | PRN
  Administered 2020-01-30: 125 ug/kg/min via INTRAVENOUS

## 2020-01-30 MED ORDER — CEFAZOLIN SODIUM-DEXTROSE 2-3 GM-%(50ML) IV SOLR
INTRAVENOUS | Status: DC | PRN
  Administered 2020-01-30: 2 g via INTRAVENOUS

## 2020-01-30 MED ORDER — OXYCODONE HCL 5 MG PO TABS
5.0000 mg | ORAL_TABLET | ORAL | Status: DC | PRN
  Administered 2020-01-31 – 2020-02-02 (×10): 10 mg via ORAL
  Administered 2020-02-02: 5 mg via ORAL
  Administered 2020-02-03 (×2): 10 mg via ORAL
  Administered 2020-02-03: 5 mg via ORAL
  Administered 2020-02-03 – 2020-02-18 (×58): 10 mg via ORAL
  Filled 2020-01-30 (×16): qty 2
  Filled 2020-01-30: qty 1
  Filled 2020-01-30 (×14): qty 2
  Filled 2020-01-30: qty 1
  Filled 2020-01-30 (×16): qty 2
  Filled 2020-01-30: qty 1
  Filled 2020-01-30 (×24): qty 2

## 2020-01-30 MED ORDER — 0.9 % SODIUM CHLORIDE (POUR BTL) OPTIME
TOPICAL | Status: DC | PRN
  Administered 2020-01-30: 1000 mL

## 2020-01-30 SURGICAL SUPPLY — 31 items
BLADE SAW SGTL MED 73X18.5 STR (BLADE) IMPLANT
BLADE SURG 21 STRL SS (BLADE) ×2 IMPLANT
BNDG COHESIVE 4X5 TAN STRL (GAUZE/BANDAGES/DRESSINGS) ×2 IMPLANT
BNDG GAUZE ELAST 4 BULKY (GAUZE/BANDAGES/DRESSINGS) ×2 IMPLANT
COVER SURGICAL LIGHT HANDLE (MISCELLANEOUS) ×4 IMPLANT
COVER WAND RF STERILE (DRAPES) ×2 IMPLANT
DRAPE U-SHAPE 47X51 STRL (DRAPES) ×4 IMPLANT
DRESSING PREVENA PLUS CUSTOM (GAUZE/BANDAGES/DRESSINGS) IMPLANT
DRSG ADAPTIC 3X8 NADH LF (GAUZE/BANDAGES/DRESSINGS) ×2 IMPLANT
DRSG PAD ABDOMINAL 8X10 ST (GAUZE/BANDAGES/DRESSINGS) ×4 IMPLANT
DRSG PREVENA PLUS CUSTOM (GAUZE/BANDAGES/DRESSINGS) ×2
DURAPREP 26ML APPLICATOR (WOUND CARE) ×2 IMPLANT
ELECT REM PT RETURN 9FT ADLT (ELECTROSURGICAL) ×2
ELECTRODE REM PT RTRN 9FT ADLT (ELECTROSURGICAL) ×1 IMPLANT
GAUZE SPONGE 4X4 12PLY STRL (GAUZE/BANDAGES/DRESSINGS) ×2 IMPLANT
GLOVE BIOGEL PI IND STRL 9 (GLOVE) ×1 IMPLANT
GLOVE BIOGEL PI INDICATOR 9 (GLOVE) ×1
GLOVE SURG ORTHO 9.0 STRL STRW (GLOVE) ×2 IMPLANT
GOWN STRL REUS W/ TWL XL LVL3 (GOWN DISPOSABLE) ×2 IMPLANT
GOWN STRL REUS W/TWL XL LVL3 (GOWN DISPOSABLE) ×4
KIT BASIN OR (CUSTOM PROCEDURE TRAY) ×2 IMPLANT
KIT PREVENA INCISION MGT 13 (CANNISTER) ×1 IMPLANT
KIT TURNOVER KIT B (KITS) ×2 IMPLANT
NS IRRIG 1000ML POUR BTL (IV SOLUTION) ×2 IMPLANT
PACK ORTHO EXTREMITY (CUSTOM PROCEDURE TRAY) ×2 IMPLANT
PAD ARMBOARD 7.5X6 YLW CONV (MISCELLANEOUS) ×4 IMPLANT
STOCKINETTE IMPERVIOUS LG (DRAPES) IMPLANT
SUT ETHILON 2 0 PSLX (SUTURE) ×2 IMPLANT
TOWEL GREEN STERILE (TOWEL DISPOSABLE) ×2 IMPLANT
TUBE CONNECTING 12X1/4 (SUCTIONS) ×2 IMPLANT
YANKAUER SUCT BULB TIP NO VENT (SUCTIONS) ×2 IMPLANT

## 2020-01-30 NOTE — Progress Notes (Signed)
Pharmacy Antibiotic Note  Leonard Bentley is a 59 y.o. male admitted on 01/27/2020 with cellulitis.  Pharmacy has been consulted for vancomycin dosing. Pt is afebrile and WBC is low. SCr is WNL. Recent toe amputation.   Plan: Continue Vancomycin 1gm IV Q12H  F/u renal fxn, C&S, clinical status and peak/trough at [facility] pending pending post-surgical abx plans   Weight: 192 lb 3.2 oz (87.2 kg) Temp (24hrs), Avg:97.7 F (36.5 C), Min:97.1 F (36.2 C), Max:98.6 F (37 C)  Recent Labs  Lab 01/27/20 1524 01/27/20 1752 01/28/20 0240 01/28/20 0800 01/29/20 0057 01/30/20 0234  WBC 3.2*  --   --  2.7* 2.8* 3.2*  CREATININE 0.85  --   --  0.91 0.87 0.86  LATICACIDVEN 4.9* 2.7* 1.2  --   --   --     Estimated Creatinine Clearance: 99.9 mL/min (by C-G formula based on SCr of 0.86 mg/dL).    Allergies  Allergen Reactions  . Other Other (See Comments)    "Cactus" caused blisters on tongue (if prepared as food)    Antimicrobials this admission: Vanc 3/21>> CTX  3/21>>  Dose adjustments this admission: N/A  Microbiology results: Blood cultures: NGTD  Karely Hurtado A. Levada Dy, PharmD, BCPS, FNKF Clinical Pharmacist [place] Please utilize Amion for appropriate phone number to reach the unit pharmacist ([place])   01/30/2020 8:20 AM

## 2020-01-30 NOTE — Anesthesia Procedure Notes (Signed)
Anesthesia Regional Block: Popliteal block   Pre-Anesthetic Checklist: ,, timeout performed, Correct Patient, Correct Site, Correct Laterality, Correct Procedure, Correct Position, site marked, Risks and benefits discussed, pre-op evaluation,  At surgeon's request and post-op pain management  Laterality: Left  Prep: Maximum Sterile Barrier Precautions used, chloraprep       Needles:  Injection technique: Single-shot  Needle Type: Echogenic Needle     Needle Length: 9cm  Needle Gauge: 21     Additional Needles:   Procedures:,,,, ultrasound used (permanent image in chart),,,,  Narrative:  Start time: 01/30/2020 10:27 AM End time: 01/30/2020 10:33 AM Injection made incrementally with aspirations every 5 mL.  Performed by: Personally  Anesthesiologist: Barnet Glasgow, MD  Additional Notes: Block assessed. Patient tolerated procedure well.

## 2020-01-30 NOTE — Progress Notes (Signed)
PROGRESS NOTE  Leonard Bentley GDJ:[id number] DOB: Apr 07, 1961   PCP: [name], NP  Patient is from: Home.  Lives alone.  Uses cane and walker since he was left foot surgery on 01/16/2020.  DOA: 01/27/2020 LOS: 3  Brief Narrative / Interim history: 59 year old male with history of left foot osteomyelitis status post first and second left ray amputation on 01/16/2020 by Dr. [name], combined CHF with last EF improved to 55/60%, mitral valve repair, history of PE on warfarin, stage II NHL in remission, EtOH abuse and tobacco abuse presenting with increased pain and drainage from left foot.  No fever or trauma involved.  Patient was discharged on 3/12.  Not on antibiotic.  In ED, HDS on arrival but became tachycardic to 120s.  100% on room air.  Afebrile.  WBC 3.2.  Anion gap 17.  Otherwise, CBC with differential and CMP without significant finding.  Lactic acid 4.9> 2.7.  HS Trop negative x2.  EKG NSR.  Influenza PCR and COVID-19 negative.  INR 3.8.  CXR without acute finding.  DG left foot with new displaced fracture through the third metatarsal head concerning for pathologic fracture secondary to underlying osteomyelitis, and nonspecific soft tissue swelling about the foot.  Patient received IV vitamin K 2.5 and empiric antibiotics and admitted.  Reportedly orthopedic surgery, Dr. [name] consulted.   Bilateral ABI without PAD.  Patient had left foot third ray surgery amputation, local tissue rearrangement for wound closure 5 x 10 cm and Prevena 13 cm wound VAC dressing by Dr. [name] on 3/24   Subjective: Patient had surgical procedure as above this morning.  No complaints at this time.  Pain fairly controlled.  Denies chest pain, dyspnea, GI or UTI symptoms.  Objective: Vitals:   01/30/20 1300 01/30/20 1312 01/30/20 1315 01/30/20 1329  BP: 109/87 (!) 111/93 (!) 111/93 110/88  Pulse: 63 71 65 61  Resp: (!) 22 15 (!) 21 20  Temp:  98.3 F (36.8 C)  98.2 F (36.8 C)  TempSrc:    Oral  SpO2:  98% 100% 98% 97%  Weight:        Intake/Output Summary (Last 24 hours) at 01/30/2020 1530 Last data filed at 01/30/2020 1109 Gross per 24 hour  Intake 875.06 ml  Output 1380 ml  Net -504.94 ml   Filed Weights   01/29/20 0402 01/30/20 0419  Weight: 90.6 kg 87.2 kg    Examination:  GENERAL: No acute distress.  Appears well.  HEENT: MMM.  Vision and hearing grossly intact.  NECK: Supple.  No apparent JVD.  RESP:  No IWOB. Good air movement bilaterally. CVS:  RRR. Heart sounds normal.  ABD/GI/GU: Bowel sounds present. Soft. Non tender.  MSK/EXT: Status post first through third ray amputation of left foot. SKIN: Dressing over left foot with Prevena wound VAC-no significant output. NEURO: Awake, alert and oriented appropriately.  No apparent focal neuro deficit. PSYCH: Calm. Normal affect.  Procedures:  3/24-Patient had left foot third ray amputation, local tissue rearrangement for wound closure 5 x 10 cm and Prevena 13 cm wound VAC dressing by Dr. [name] on 3/24  Assessment & Plan: Osteomyelitis and pathologic fracture of left third metatarsal bone-CRP 1.2.  ESR within normal. History of first and second ray amputation of left foot by Dr. [name] on 3/10 Sepsis due to osteomyelitis-sepsis physiology resolved. -S/p third ray amputation and local tissue rearrangement for wound closure by Dr. [name] on 3/24 -COVID-19, influenza PCR and blood cultures negative -Lactic acidosis resolved.  Procalcitonin negative. -Continue vancomycin and ceftriaxone until 3/25 -Strict nonweightbearing on the left.  Wound VAC for 1 week -PT/OT  Dark skin discoloration over right first and second toe-bilateral ABI without PAD.  No signs of infection. -Continue monitoring  Chronic diastolic CHF-previously with combined CHF but recovered LV function on echocardiogram in 08/2018.  No acute cardiopulmonary symptoms.  Does not seem to be on diuretics. -Monitor fluid status  History of PE on  warfarin/supratherapeutic INR-INR 3.8> IV vit K 2.5 mg> 2.6> 1.1 -We will resume warfarin when okay from surgical standpoint  Mitra valve repair with annular ring prosthesis-stable.  Tachycardia/essential hypertension/history of SVT/PAT/PACs-due to withdrawal?  Resolving. -Continue home metoprolol  Alcohol abuse-reports drinking a bottle of wine a [place].  Last drink was the [place] prior to admission.  -Encouraged cessation -Continue CIWA with as needed Ativan -Continue vitamins  Tobacco use disorder: Reports smoking about a pack a [place]. -Encouraged cessation.  Discussed the importance of this for wound healing -Nicotine patch  Lactic acidosis-likely due to alcohol versus sepsis.  Resolved.  Anion gap: Likely due to lactic acidosis and alcohol.  Resolved.  History of stage II NHL in remission- -outpatient follow-up.  Hypomagnesemia-likely due to alcohol. -Replenish and recheck  Leukopenia-likely due to alcohol.  Improved. -Continue monitoring  Normocytic anemia: Hgb 10.9, at baseline.  Anemia panel consistent with ACD -Monitor intermittently       Pressure Injury 01/28/20 Buttocks Right Stage 1 -  Intact skin with non-blanchable redness of a localized area usually over a bony prominence. (Active)  01/28/20 1629  Location: Buttocks  Location Orientation: Right  Staging: Stage 1 -  Intact skin with non-blanchable redness of a localized area usually over a bony prominence.  Wound Description (Comments):   Present on Admission: Yes               DVT prophylaxis: SCD Code Status: Full code Family Communication: Patient and/or RN. Available if any question.   Discharge barrier: Left foot fracture/osteomyelitis and subtherapeutic INR Patient is from: Home Final disposition: SNF once INR therapeutic.  Consultants: Orthopedic surgery   Microbiology summarized: COVID-19 negative Influenza PCR negative Blood cultures negative  Sch Meds:  Scheduled Meds: . folic  acid  1 mg Oral Daily  . LORazepam  0-4 mg Intravenous Q12H  . metoprolol tartrate  25 mg Oral BID  . multivitamin with minerals  1 tablet Oral Daily  . nicotine  14 mg Transdermal Daily  . thiamine  100 mg Oral Daily   Or  . thiamine  100 mg Intravenous Daily   Continuous Infusions: . cefTRIAXone (ROCEPHIN)  IV 2 g (01/29/20 1804)  . lactated ringers 10 mL/hr at 01/30/20 1018  . vancomycin 1,000 mg (01/30/20 1402)   PRN Meds:.acetaminophen **OR** acetaminophen, LORazepam **OR** LORazepam, metoprolol tartrate, morphine injection, ondansetron **OR** ondansetron (ZOFRAN) IV  Antimicrobials: Anti-infectives (From admission, onward)   Start     Dose/Rate Route Frequency Ordered Stop   01/30/20 0830  ceFAZolin (ANCEF) IVPB 2g/100 mL premix  Status:  Discontinued     2 g 200 mL/hr over 30 Minutes Intravenous To Short Stay 01/30/20 0824 01/30/20 1330   01/28/20 1800  cefTRIAXone (ROCEPHIN) 2 g in sodium chloride 0.9 % 100 mL IVPB     2 g 200 mL/hr over 30 Minutes Intravenous Every 24 hours 01/27/20 2211 01/31/20 1130   01/28/20 0600  vancomycin (VANCOCIN) IVPB 1000 mg/200 mL premix     1,000 mg 200 mL/hr over 60 Minutes Intravenous Every 12  hours 01/27/20 1738 01/31/20 1130   01/27/20 1730  vancomycin (VANCOCIN) IVPB 1000 mg/200 mL premix  Status:  Discontinued     1,000 mg 200 mL/hr over 60 Minutes Intravenous  Once 01/27/20 1725 01/27/20 1726   01/27/20 1730  cefTRIAXone (ROCEPHIN) 2 g in sodium chloride 0.9 % 100 mL IVPB     2 g 200 mL/hr over 30 Minutes Intravenous  Once 01/27/20 1725 01/27/20 2010   01/27/20 1730  vancomycin (VANCOREADY) IVPB 2000 mg/400 mL     2,000 mg 200 mL/hr over 120 Minutes Intravenous  Once 01/27/20 1726 01/27/20 2219       I have personally reviewed the following labs and images: CBC: Recent Labs  Lab 01/27/20 1524 01/28/20 0800 01/29/20 0057 01/30/20 0234  WBC 3.2* 2.7* 2.8* 3.2*  NEUTROABS 2.1  --   --   --   HGB 13.3 12.2* 10.9* 10.9*  HCT  41.3 37.9* 33.5* 33.3*  MCV 82.9 84.8 81.7 82.0  PLT 273 224 202 189   BMP &GFR Recent Labs  Lab 01/27/20 1524 01/28/20 0800 01/29/20 0057 01/30/20 0234  NA 136 137 135 137  K 4.7 4.4 4.4 4.2  CL 94* 97* 100 101  CO2 '25 26 27 27  '$ GLUCOSE 107* 98 126* 123*  BUN <5* '6 7 9  '$ CREATININE 0.85 0.91 0.87 0.86  CALCIUM 9.3 8.9 8.7* 8.7*  MG  --  1.9 1.5* 1.9  PHOS  --  3.9 3.4 3.0   Estimated Creatinine Clearance: 99.9 mL/min (by C-G formula based on SCr of 0.86 mg/dL). Liver & Pancreas: Recent Labs  Lab 01/27/20 1524 01/28/20 0800 01/29/20 0057 01/30/20 0234  AST 56* 43*  --   --   ALT 42 32  --   --   ALKPHOS 60 50  --   --   BILITOT 0.9 1.1  --   --   PROT 7.1 6.2*  --   --   ALBUMIN 3.6 3.2* 2.9* 3.0*   No results for input(s): LIPASE, AMYLASE in the last 168 hours. No results for input(s): AMMONIA in the last 168 hours. Diabetic: No results for input(s): HGBA1C in the last 72 hours. Recent Labs  Lab 01/28/20 2121  GLUCAP 156*   Cardiac Enzymes: No results for input(s): CKTOTAL, CKMB, CKMBINDEX, TROPONINI in the last 168 hours. No results for input(s): PROBNP in the last 8760 hours. Coagulation Profile: Recent Labs  Lab 01/27/20 1752 01/28/20 0800 01/30/20 0836  INR 3.8* 2.6* 1.1   Thyroid Function Tests: No results for input(s): TSH, T4TOTAL, FREET4, T3FREE, THYROIDAB in the last 72 hours. Lipid Profile: No results for input(s): CHOL, HDL, LDLCALC, TRIG, CHOLHDL, LDLDIRECT in the last 72 hours. Anemia Panel: Recent Labs    01/29/20 1604  VITAMINB12 785  FOLATE 18.1  FERRITIN 279  TIBC 259  IRON 113  RETICCTPCT 1.6   Urine analysis:    Component Value Date/Time   COLORURINE YELLOW 01/27/2020 1841   APPEARANCEUR CLEAR 01/27/2020 1841   APPEARANCEUR Clear 08/03/2019 1143   LABSPEC 1.015 01/27/2020 1841   PHURINE 6.0 01/27/2020 1841   GLUCOSEU NEGATIVE 01/27/2020 1841   HGBUR SMALL (A) 01/27/2020 1841   BILIRUBINUR NEGATIVE 01/27/2020 1841    BILIRUBINUR small (A) 08/03/2019 1144   BILIRUBINUR Negative 08/03/2019 1143   KETONESUR 5 (A) 01/27/2020 1841   PROTEINUR 30 (A) 01/27/2020 1841   UROBILINOGEN 1.0 08/03/2019 1144   UROBILINOGEN 1.0 01/25/2013 0825   NITRITE NEGATIVE 01/27/2020 1841   LEUKOCYTESUR NEGATIVE  01/27/2020 1841   Sepsis Labs: Invalid input(s): PROCALCITONIN, [place]  Microbiology: Recent Results (from the past 240 hour(s))  Blood culture (routine x 2)     Status: None (Preliminary result)   Collection Time: 01/27/20  6:07 PM   Specimen: BLOOD RIGHT ARM  Result Value Ref Range Status   Specimen Description BLOOD RIGHT ARM  Final   Special Requests   Final    BOTTLES DRAWN AEROBIC AND ANAEROBIC Blood Culture results may not be optimal due to an excessive volume of blood received in culture bottles   Culture   Final    NO GROWTH 3 DAYS Performed at [facility], [place] [address], [place], [place] [address]    Report Status PENDING  Incomplete  Blood culture (routine x 2)     Status: None (Preliminary result)   Collection Time: 01/27/20  6:07 PM   Specimen: BLOOD LEFT HAND  Result Value Ref Range Status   Specimen Description BLOOD LEFT HAND  Final   Special Requests   Final    BOTTLES DRAWN AEROBIC AND ANAEROBIC Blood Culture results may not be optimal due to an excessive volume of blood received in culture bottles   Culture   Final    NO GROWTH 3 DAYS Performed at [facility], [place] [address], [place], [place] [address]    Report Status PENDING  Incomplete  Respiratory Panel by RT PCR (Flu A&B, Covid) - Nasopharyngeal Swab     Status: None   Collection Time: 01/27/20  8:09 PM   Specimen: Nasopharyngeal Swab  Result Value Ref Range Status   SARS Coronavirus 2 by RT PCR NEGATIVE NEGATIVE Final    Comment: (NOTE) SARS-CoV-2 target nucleic acids are NOT DETECTED. The SARS-CoV-2 RNA is generally detectable in upper respiratoy specimens during the acute phase of infection. The  lowest concentration of SARS-CoV-2 viral copies this assay can detect is 131 copies/mL. A negative result does not preclude SARS-Cov-2 infection and should not be used as the sole basis for treatment or other patient management decisions. A negative result may occur with  improper specimen collection/handling, submission of specimen other than nasopharyngeal swab, presence of viral mutation(s) within the areas targeted by this assay, and inadequate number of viral copies (<131 copies/mL). A negative result must be combined with clinical observations, patient history, and epidemiological information. The expected result is Negative. Fact Sheet for Patients:  PinkCheek.be Fact Sheet for Healthcare Providers:  GravelBags.it This test is not yet ap proved or cleared by the [place] FDA and  has been authorized for detection and/or diagnosis of SARS-CoV-2 by FDA under an Emergency Use Authorization (EUA). This EUA will remain  in effect (meaning this test can be used) for the duration of the COVID-19 declaration under Section 564(b)(1) of the Act, 21 U.S.C. section 360bbb-3(b)(1), unless the authorization is terminated or revoked sooner.    Influenza A by PCR NEGATIVE NEGATIVE Final   Influenza B by PCR NEGATIVE NEGATIVE Final    Comment: (NOTE) The Xpert Xpress SARS-CoV-2/FLU/RSV assay is intended as an aid in  the diagnosis of influenza from Nasopharyngeal swab specimens and  should not be used as a sole basis for treatment. Nasal washings and  aspirates are unacceptable for Xpert Xpress SARS-CoV-2/FLU/RSV  testing. Fact Sheet for Patients: PinkCheek.be Fact Sheet for Healthcare Providers: GravelBags.it This test is not yet approved or cleared by the [place] FDA and  has been authorized for detection and/or diagnosis of SARS-CoV-2 by  FDA  under an Emergency  Use Authorization (EUA). This EUA will remain  in effect (meaning this test can be used) for the duration of the  Covid-19 declaration under Section 564(b)(1) of the Act, 21  U.S.C. section 360bbb-3(b)(1), unless the authorization is  terminated or revoked. Performed at [facility], [place] [address] [place], [place] [address]     Radiology Studies: No results found.   Marcelles Clinard T. Sunflower  If 7PM-7AM, please contact night-coverage [email or web address] Password [facility] 01/30/2020, 3:30 PM

## 2020-01-30 NOTE — Interval H&P Note (Signed)
History and Physical Interval Note:  01/30/2020 6:59 AM  Leonard Bentley  has presented today for surgery, with the diagnosis of Wound Dehiscence.  The various methods of treatment have been discussed with the patient and family. After consideration of risks, benefits and other options for treatment, the patient has consented to  Procedure(s): LEFT FOOT 3RD RAY AMPUTATION (Left) as a surgical intervention.  The patient's history has been reviewed, patient examined, no change in status, stable for surgery.  I have reviewed the patient's chart and labs.  Questions were answered to the patient's satisfaction.     [name]

## 2020-01-30 NOTE — Progress Notes (Signed)
Occupational Therapy Evaluation Patient Details Name: Leonard Bentley MRN: [id number] DOB: May 16, 1961 Today's Date: 01/30/2020    History of Present Illness 59 y.o. male with what a recent left foot first and second ray amputation for osteomyelitis and discharged on March 10/27/2020 with history of minimally invasive mitral valve repair, chronic combined systolic and diastolic CHF last EF is improved to 55 to 60%, alcohol abuse, non-Hodgkin's lymphoma stage II in remission presents to the ER with complaint of increasing pain and discharge from the left foot. following L foot first and 2nd ray amputation.   X-rays show fracture involving the left third metatarsal concerning for osteomyelitis. Admitted 01/27/20 for amputation revision sx.    Clinical Impression   PTA, pt lives alone and was Independent in ADLs, IADLs, and mobility. Pt recently began using [facility] for mobility and stability. Pt reports enjoying to run and would like to be able to do this again. Pt presents with minimal pain in L foot, motivated to participate in therapy. Pt min guard for sit to stand from bedside with RW, Min A for mobility in room with assistance needed for turning with RW. Minor cues needed for best sequencing of tasks for safety. Pt focused on keeping weight off of L foot today and demonstrates good B UE strength to assist with task. Lowered RW height to increase ease of weightbearing through arms at this time. Educated and collaborated with pt on recommendations for safety with ADLs, maintaining precautions for best wound healing, and pt's desire to participate in short term rehab prior to return home. Recommend SNF for short term rehab to maximize independence and wound healing. Will continue to follow acutely.     Follow Up Recommendations  SNF;Supervision/Assistance - 24 hour    Equipment Recommendations  3 in 1 bedside commode    Recommendations for Other Services       Precautions / Restrictions  Precautions Precautions: Fall;Other (comment) Required Braces or Orthoses: Other Brace Other Brace: post-op shoe Restrictions Weight Bearing Restrictions: Yes LLE Weight Bearing: Touchdown weight bearing Other Position/Activity Restrictions: post op shoe      Mobility Bed Mobility Overal bed mobility: Modified Independent Bed Mobility: Supine to Sit     Supine to sit: Modified independent (Device/Increase time)     General bed mobility comments: Pt sitting EOB with L LE propped on bed, no assistance needed to advance LE  Transfers Overall transfer level: Needs assistance Equipment used: Rolling walker (2 wheeled) Transfers: Sit to/from [facility] Sit to Stand: Min guard Stand pivot transfers: Min assist       General transfer comment: Min guard for sit to stand from low bed with RW. Min A for pivots to advance RW safely while adhering to precautions    Balance Overall balance assessment: Needs assistance Sitting-balance support: Feet supported Sitting balance-Leahy Scale: Good     Standing balance support: Bilateral upper extremity supported Standing balance-Leahy Scale: Poor Standing balance comment: unable to maintain TDWB without BUE support                           ADL either performed or assessed with clinical judgement   ADL Overall ADL's : Needs assistance/impaired Eating/Feeding: Independent;Sitting Eating/Feeding Details (indicate cue type and reason): Currently NPO for surgery  Grooming: Minimal assistance;Standing Grooming Details (indicate cue type and reason): Pt unable to maintain standing grooming balance at this time. Upper Body Bathing: Set up;Sitting   Lower Body Bathing: Moderate assistance;Sit  to/from stand   Upper Body Dressing : Set up;Sitting   Lower Body Dressing: Moderate assistance;Sit to/from stand   Toilet Transfer: Minimal assistance;Stand-pivot;Ambulation;RW Toilet Transfer Details (indicate cue type  and reason): Min A for transfers with RW mgmt assistance. Pt focused on keeping L foot off of floor today Toileting- Clothing Manipulation and Hygiene: Minimal assistance;Sit to/from stand       Functional mobility during ADLs: Minimal assistance;Rolling walker;Cueing for safety;Cueing for sequencing General ADL Comments: Pt overall more focused on maintaining WB precaution with mobility using RW today. Minor cues for sequencing of mobility pattern      Vision         Perception     Praxis      Pertinent Vitals/Pain Pain Assessment: 0-10 Pain Score: 3  Pain Location: L foot Pain Descriptors / Indicators: Aching;Sore;Throbbing Pain Intervention(s): Limited activity within patient's tolerance;Monitored during session;Other (comment)(notified RN)     Hand Dominance Right   Extremity/Trunk Assessment Upper Extremity Assessment Upper Extremity Assessment: Overall WFL for tasks assessed   Lower Extremity Assessment Lower Extremity Assessment: Defer to PT evaluation   Cervical / Trunk Assessment Cervical / Trunk Assessment: Normal   Communication Communication Communication: No difficulties   Cognition Arousal/Alertness: Awake/alert Behavior During Therapy: WFL for tasks assessed/performed                                       General Comments  Dressing on L foot with some drainage noted , not hooked up to tele, resp probe, etc.     Exercises     Shoulder Instructions      Home Living Family/patient expects to be discharged to:: Private residence Living Arrangements: Alone Available Help at Discharge: Family;Friend(s);Available PRN/intermittently Type of Home: House Home Access: Stairs to enter [employer] of Steps: 3 Entrance Stairs-Rails: Right Home Layout: One level     Bathroom Shower/Tub: Teacher, early years/pre: Standard     Home Equipment: Environmental consultant - 2 wheels          Prior Functioning/Environment Level of  Independence: Needs assistance  Gait / Transfers Assistance Needed: Pt with difficulty maintaining WB precautions with last DC home.  ADL's / Homemaking Assistance Needed: was indepedendent with ADL/selfcare   Comments: Pt reports only using cane for mobility recently. Pt reports running as hobby previously        OT Problem List: Impaired balance (sitting and/or standing);Pain;Decreased activity tolerance;Decreased coordination;Decreased knowledge of use of DME or AE      OT Treatment/Interventions: Self-care/ADL training;DME and/or AE instruction;Therapeutic activities;Balance training;Patient/family education    OT Goals(Current goals can be found in the care plan section) Acute Rehab OT Goals Patient Stated Goal: to be able to run again OT Goal Formulation: With patient Time For Goal Achievement: 02/14/20 Potential to Achieve Goals: Good  OT Frequency: Min 2X/week   Barriers to D/C:            Co-evaluation              AM-PAC OT "6 Clicks" Daily Activity     Outcome Measure Help from another person eating meals?: None Help from another person taking care of personal grooming?: A Little Help from another person toileting, which includes using toliet, bedpan, or urinal?: A Little Help from another person bathing (including washing, rinsing, drying)?: A Little Help from another person to put on and taking off regular upper  body clothing?: A Little Help from another person to put on and taking off regular lower body clothing?: A Little 6 Click Score: 19   End of Session Equipment Utilized During Treatment: Gait belt;Rolling walker Nurse Communication: Mobility status;Other (comment)(post op shoe need, RW adjustments)  Activity Tolerance: Patient tolerated treatment well Patient left: in bed;with call bell/phone within reach(sitting EOB)  OT Visit Diagnosis: Unsteadiness on feet (R26.81);Other abnormalities of gait and mobility (R26.89);Pain Pain - Right/Left:  Left Pain - part of body: Ankle and joints of foot                Time: [id number] OT Time Calculation (min): 20 min Charges:  OT General Charges $OT Visit: 1 Visit OT Evaluation $OT Eval Moderate Complexity: 1 Mod  Layla Maw, OTR/L  Layla Maw 01/30/2020, 9:28 AM

## 2020-01-30 NOTE — Op Note (Signed)
01/30/2020  11:18 AM  PATIENT:  Leonard Bentley    PRE-OPERATIVE DIAGNOSIS:  Wound Dehiscence  POST-OPERATIVE DIAGNOSIS:  Same  PROCEDURE:  LEFT FOOT 3RD RAY AMPUTATION Local tissue rearrangement for wound closure 5 x 10 cm. Application of Prevena 13 cm wound VAC dressing  SURGEON:  [name], MD  PHYSICIAN ASSISTANT:None ANESTHESIA:   General  PREOPERATIVE INDICATIONS:  [name] is a  59 y.o. male with a diagnosis of Wound Dehiscence who failed conservative measures and elected for surgical management.    The risks benefits and alternatives were discussed with the patient preoperatively including but not limited to the risks of infection, bleeding, nerve injury, cardiopulmonary complications, the need for revision surgery, among others, and the patient was willing to proceed.  OPERATIVE IMPLANTS: Praveena 13 cm wound VAC dressing  @ENCIMAGES @  OPERATIVE FINDINGS: Exposed metatarsals with nonviable surrounding soft tissue  OPERATIVE PROCEDURE: Patient was brought the operating room after undergoing a regional anesthetic.  The left lower extremity was prepped using DuraPrep draped into a sterile field a timeout was called.  An elliptical incision was made around his previous incision and also encompassed the third ray.  The third ray was resected through the shaft of the third metatarsal there was further debridement and resection of the second and first metatarsal.  Bone was debrided back to healthy viable margins.  Soft tissue was further resected back until there was healthy viable margins further deep soft tissue was further resected back to healthy viable margins electrocautery was used hemostasis the wound was irrigated with normal saline.  Local tissue rearrangement was used to close the wound 10 x 5 cm.  2-0 nylon was used for wound closure.  A Prevena wound VAC was applied this had a good suction fit this was overwrapped with Covan.  Patient was taken the PACU in stable  condition.   DISCHARGE PLANNING:  Antibiotic duration: Continue antibiotics for 24 hours  Weightbearing: Strict nonweightbearing on the left  Pain medication: Opioid pathway  Dressing care/ Wound VAC: Continue wound VAC for 1 week at discharge  Ambulatory devices: Walker or crutches  Discharge to: Will need discharge to skilled nursing.  Previous discharge patient was discharged to home he was unable to care for himself walked on his foot causing the wound dehiscence.  Follow-up: In the office 1 week post operative.

## 2020-01-30 NOTE — Anesthesia Postprocedure Evaluation (Signed)
Anesthesia Post Note  Patient: Leonard Bentley  Procedure(s) Performed: LEFT FOOT 3RD RAY AMPUTATION (Left )     Patient location during evaluation: PACU Anesthesia Type: Regional Level of consciousness: awake and alert Pain management: pain level controlled Vital Signs Assessment: post-procedure vital signs reviewed and stable Respiratory status: spontaneous breathing, nonlabored ventilation, respiratory function stable and patient connected to nasal cannula oxygen Cardiovascular status: stable and blood pressure returned to baseline Postop Assessment: no apparent nausea or vomiting Anesthetic complications: no    Last Vitals:  Vitals:   01/30/20 1245 01/30/20 1300  BP: (!) 112/94 109/87  Pulse: 63 63  Resp: (!) 26 (!) 22  Temp:    SpO2: 97% 98%    Last Pain:  Vitals:   01/30/20 1245  TempSrc:   PainSc: [place] A [place] [name]

## 2020-01-30 NOTE — Transfer of Care (Signed)
Immediate Anesthesia Transfer of Care Note  Patient: Leonard Bentley  Procedure(s) Performed: LEFT FOOT 3RD RAY AMPUTATION (Left )  Patient Location: PACU  Anesthesia Type:MAC and Regional  Level of Consciousness: awake, alert , oriented and patient cooperative  Airway & Oxygen Therapy: Patient Spontanous Breathing and Patient connected to nasal cannula oxygen  Post-op Assessment: Report given to RN and Post -op Vital signs reviewed and stable  Post vital signs: Reviewed and stable  Last Vitals:  Vitals Value Taken Time  BP 102/75 01/30/20 1114  Temp 36.5 C 01/30/20 1114  Pulse 66 01/30/20 1116  Resp 25 01/30/20 1116  SpO2 97 % 01/30/20 1116  Vitals shown include unvalidated device data.  Last Pain:  Vitals:   01/30/20 1030  TempSrc:   PainSc: 0-No pain      Patients Stated Pain Goal: 0 (67/59/16 3846)  Complications: No apparent anesthesia complications

## 2020-01-30 NOTE — Progress Notes (Signed)
CSW acknowledges SNF consult. Barrier will be lack of insurance. CSW to continue to follow.    Percell Locus Kaidence Sant LCSW [phone]

## 2020-01-30 NOTE — Anesthesia Procedure Notes (Addendum)
Procedure Name: MAC Date/Time: 01/30/2020 10:45 AM Performed by: Oletta Lamas, CRNA Pre-anesthesia Checklist: Patient identified, Emergency Drugs available, Suction available, Patient being monitored and Timeout performed Patient Re-evaluated:Patient Re-evaluated prior to induction

## 2020-01-31 LAB — CBC
HCT: 32.2 % — ABNORMAL LOW (ref 39.0–52.0)
Hemoglobin: 10.5 g/dL — ABNORMAL LOW (ref 13.0–17.0)
MCH: 26.9 pg (ref 26.0–34.0)
MCHC: 32.6 g/dL (ref 30.0–36.0)
MCV: 82.6 fL (ref 80.0–100.0)
Platelets: 185 10*3/uL (ref 150–400)
RBC: 3.9 MIL/uL — ABNORMAL LOW (ref 4.22–5.81)
RDW: 15.4 % (ref 11.5–15.5)
WBC: 5.6 10*3/uL (ref 4.0–10.5)
nRBC: 0.4 % — ABNORMAL HIGH (ref 0.0–0.2)

## 2020-01-31 LAB — RENAL FUNCTION PANEL
Albumin: 2.8 g/dL — ABNORMAL LOW (ref 3.5–5.0)
Anion gap: 9 (ref 5–15)
BUN: 9 mg/dL (ref 6–20)
CO2: 24 mmol/L (ref 22–32)
Calcium: 8.7 mg/dL — ABNORMAL LOW (ref 8.9–10.3)
Chloride: 102 mmol/L (ref 98–111)
Creatinine, Ser: 0.82 mg/dL (ref 0.61–1.24)
GFR calc Af Amer: >60 mL/min (ref >60)
GFR calc non Af Amer: >60 mL/min (ref >60)
Glucose, Bld: 117 mg/dL — ABNORMAL HIGH (ref 70–99)
Phosphorus: 2.8 mg/dL (ref 2.5–4.6)
Potassium: 4.1 mmol/L (ref 3.5–5.1)
Sodium: 135 mmol/L (ref 135–145)

## 2020-01-31 LAB — HEPARIN LEVEL (UNFRACTIONATED): Heparin Unfractionated: 0.2 IU/mL — ABNORMAL LOW (ref 0.30–0.70)

## 2020-01-31 LAB — MAGNESIUM: Magnesium: 1.6 mg/dL — ABNORMAL LOW (ref 1.7–2.4)

## 2020-01-31 MED ORDER — HYDROXYZINE HCL 25 MG PO TABS
25.0000 mg | ORAL_TABLET | Freq: Four times a day (QID) | ORAL | Status: AC | PRN
  Administered 2020-01-31: 25 mg via ORAL
  Filled 2020-01-31: qty 1

## 2020-01-31 MED ORDER — TRAZODONE HCL 50 MG PO TABS
50.0000 mg | ORAL_TABLET | Freq: Every evening | ORAL | Status: AC | PRN
  Administered 2020-01-31: 50 mg via ORAL
  Filled 2020-01-31: qty 1

## 2020-01-31 MED ORDER — WARFARIN SODIUM 7.5 MG PO TABS
7.5000 mg | ORAL_TABLET | Freq: Once | ORAL | Status: AC
  Administered 2020-01-31: 7.5 mg via ORAL
  Filled 2020-01-31: qty 1

## 2020-01-31 MED ORDER — HEPARIN (PORCINE) 25000 UT/250ML-% IV SOLN
1300.0000 [IU]/h | INTRAVENOUS | Status: DC
  Administered 2020-01-31: 1200 [IU]/h via INTRAVENOUS
  Administered 2020-02-01: 1400 [IU]/h via INTRAVENOUS
  Administered 2020-02-02 – 2020-02-03 (×3): 1300 [IU]/h via INTRAVENOUS
  Filled 2020-01-31 (×5): qty 250

## 2020-01-31 MED ORDER — WARFARIN - PHARMACIST DOSING INPATIENT: Freq: Every day | Status: DC

## 2020-01-31 MED ORDER — MAGNESIUM SULFATE 2 GM/50ML IV SOLN
2.0000 g | Freq: Once | INTRAVENOUS | Status: AC
  Administered 2020-01-31: 2 g via INTRAVENOUS
  Filled 2020-01-31: qty 50

## 2020-01-31 NOTE — NC FL2 (Signed)
[place] MEDICA[place] FL2 LEVEL OF CARE SCREENING TOOL     IDENTIFICATION  Patient Name: Leonard Bentley Birthdate: [birth year] Sex: male Admission Date (Current Location): 01/27/2020  [facility] and [place] Number:  Herbalist and Address:  [facility], [place] [address] [place], [place] [address]      Provider Number: [id number]  Attending Physician Name and Address:  Mercy Riding, MD  Relative Name and Phone Number:  Alecia Lemming, friend, [phone]    Current Level of Care: Hospital Recommended Level of Care: [place] Prior Approval Number:    Date Approved/Denied:   PASRR Number: Pending PASRR  Discharge Plan: SNF    Current Diagnoses: Patient Active Problem List   Diagnosis Date Noted  . Wound dehiscence, surgical, initial encounter   . Pressure injury of skin 01/28/2020  . Subacute osteomyelitis, left ankle and foot ([place]) 01/27/2020  . Sepsis ([place]) 01/27/2020  . Cellulitis of left foot   . Left foot infection 01/11/2020  . Chronic combined systolic and diastolic heart failure ([place]) 03/06/2019  . Coagulation disorder ([place]) 03/06/2019  . Impacted cerumen of both ears 02/09/2019  . Osteomyelitis of great toe of left foot ([place]) 01/09/2019  . Foot ulcer, left ([place]) 12/27/2018  . Cellulitis 12/07/2018  . Bleeding from left ear 11/28/2018  . HTN (hypertension) 11/02/2018  . UTI (urinary tract infection) 11/02/2018  . Ecchymosis   . Tachyarrhythmia 08/30/2018  . Hypomagnesemia 08/30/2018  . Recurrent pulmonary emboli ([place])   . Hematuria   . Delirium tremens ([place])   . LFTs abnormal   . Alcoholic hepatitis without ascites   . Alcohol withdrawal ([place]) 06/04/2018  . Dizziness   . Posterior tibial tendon dysfunction 02/21/2018  . Supratherapeutic INR 11/27/2016  . Dehydration 11/27/2016  . SVT (supraventricular tachycardia) ([place])   . S/P minimally-invasive mitral valve repair 10/27/2016  . History of pulmonary embolus  (PE) 04/11/2016  . Left sided numbness   . Anxiety 04/02/2016  . Panic attacks 04/02/2016  . Mitral valve prolapse   . Mitral regurgitation 01/20/2016  . Atrial tachycardia ([place]) 01/06/2016  . Murmur 01/06/2016  . Mobitz I 10/09/2015  . PAC (premature atrial contraction) 10/09/2015  . Arthritis 08/14/2015  . Neuropathy due to chemotherapeutic drug ([place]) 03/09/2013  . Leucopenia 01/31/2013  . Personal history of pulmonary embolism 12/28/2012  . Alcohol abuse 08/24/2012  . Cigarette smoker 08/24/2012  . Hemorrhoid 03/08/2012  . Anemia 12/10/2011  . History of non-Hodgkin's lymphoma 09/25/2011    Orientation RESPIRATION BLADDER Height & Weight     Self, Time, Situation, Place  Normal Continent Weight: 195 lb 1.7 oz (88.5 kg)(office visit 01/23/20) Height:  5\' 9"  (175.3 cm)(office visit 01/23/20)  BEHAVIORAL SYMPTOMS/MOOD NEUROLOGICAL BOWEL NUTRITION STATUS  Other (Comment)(no behavioral symptoms or mood)   Continent Diet(see dc summary)  AMBULATORY STATUS COMMUNICATION OF NEEDS Skin   Limited Assist Verbally Wound Vac, Surgical wounds, Other (Comment), PU Stage and Appropriate Care(dry skin, Amputation (Left Toe) ;Surgical Incision. Scrotum, Medical, Cleansed;Barrier cream upper right buttocks. Pressure injury on right buttocks)                       Personal Care Assistance Level of Assistance  Bathing, Feeding, Dressing Bathing Assistance: Limited assistance Feeding assistance: Independent Dressing Assistance: Limited assistance     Functional Limitations Info  Sight, Hearing, Speech Sight Info: Adequate Hearing Info: Adequate Speech Info: Adequate    SPECIAL CARE FACTORS FREQUENCY  PT (  By licensed PT), OT (By licensed OT)     PT Frequency: 4x OT Frequency: 4x            Contractures Contractures Info: Not present    Additional Factors Info  Code Status, Allergies, Psychotropic Code Status Info: Full Allergies Info: Cactus Psychotropic Info: LORazepam  and hydrOXYzine         Current Medications (01/31/2020):  This is the current hospital active medication list Current Facility-Administered Medications  Medication Dose Route Frequency Provider Last Rate Last Admin  . 0.9 %  sodium chloride infusion   Intravenous Continuous Persons, Bevely Palmer, [place] 75 mL/hr at 01/30/20 1628 New Bag at 01/30/20 1628  . acetaminophen (TYLENOL) tablet 650 mg  650 mg Oral Q6H PRN Wendee Beavers T, MD   650 mg at 01/30/20 [id number]   Or  . acetaminophen (TYLENOL) suppository 650 mg  650 mg Rectal Q6H PRN Wendee Beavers T, MD      . docusate sodium (COLACE) capsule 100 mg  100 mg Oral BID Persons, Bevely Palmer, PA   100 mg at 01/31/20 [id number]  . folic acid (FOLVITE) tablet 1 mg  1 mg Oral Daily Gonfa, Taye T, MD   1 mg at 01/31/20 0907  . heparin ADULT infusion 100 units/mL (25000 units/242mL sodium chloride 0.45%)  1,200 Units/hr Intravenous Continuous Wendee Beavers, RPH 12 mL/hr at 01/31/20 1229 1,200 Units/hr at 01/31/20 1229  . lactated ringers infusion   Intravenous Continuous Persons, Bevely Palmer, [place] 10 mL/hr at 01/30/20 1018 New Bag at 01/30/20 1018  . LORazepam (ATIVAN) injection 0-4 mg  0-4 mg Intravenous Q12H Wendee Beavers T, MD   1 mg at 01/29/20 2212  . metoprolol tartrate (LOPRESSOR) injection 5 mg  5 mg Intravenous Q5 min PRN Persons, Bevely Palmer, PA      . metoprolol tartrate (LOPRESSOR) tablet 25 mg  25 mg Oral BID Wendee Beavers T, MD   25 mg at 01/31/20 0907  . morphine 2 MG/ML injection 1 mg  1 mg Intravenous Q4H PRN Wendee Beavers T, MD   1 mg at 01/29/20 2337  . multivitamin with minerals tablet 1 tablet  1 tablet Oral Daily Mercy Riding, MD   1 tablet at 01/31/20 0907  . nicotine (NICODERM CQ - dosed in mg/24 hours) patch 14 mg  14 mg Transdermal Daily Wendee Beavers T, MD   14 mg at 01/31/20 0909  . ondansetron (ZOFRAN) tablet 4 mg  4 mg Oral Q6H PRN Wendee Beavers T, MD       Or  . ondansetron (ZOFRAN) injection 4 mg  4 mg Intravenous Q6H PRN Gonfa, Taye T, MD      .  oxyCODONE (Oxy IR/ROXICODONE) immediate release tablet 5-10 mg  5-10 mg Oral Q4H PRN Persons, Bevely Palmer, PA   10 mg at 01/31/20 1213  . thiamine tablet 100 mg  100 mg Oral Daily Wendee Beavers T, MD   100 mg at 01/31/20 [id number]   Or  . thiamine (B-1) injection 100 mg  100 mg Intravenous Daily Mercy Riding, MD      . Warfarin - Pharmacist Dosing Inpatient   Does not apply [id number] Mercy Riding, MD         Discharge Medications: Please see discharge summary for a list of discharge medications.  Relevant Imaging Results:  Relevant Lab Results:   Additional Information SSN: [id number]. COVID neg on 01/27/2020  Ralph Dowdy, Student-Social Work

## 2020-01-31 NOTE — Progress Notes (Signed)
ANTICOAGULATION CONSULT NOTE - Initial Consult  Pharmacy Consult for  Heparin bridge/ Warfarin   Indication: h/o recurrent PE   Allergies  Allergen Reactions  . Other Other (See Comments)    "Cactus" caused blisters on tongue (if prepared as food)    Patient Measurements: Height: 5\' 9"  (175.3 cm)(office visit 01/23/20) Weight: 195 lb 1.7 oz (88.5 kg)(office visit 01/23/20) IBW/kg (Calculated) : 70.7 Heparin Dosing Weight: 88.4 kg  Vital Signs: Temp: 97.9 F (36.6 C) (03/25 0758) Temp Source: Oral (03/25 0758) BP: 132/105 (03/25 0758) Pulse Rate: 82 (03/25 0758)  Labs: Recent Labs    01/29/20 0057 01/29/20 0057 01/30/20 0234 01/30/20 0836 01/31/20 0258  HGB 10.9*   < > 10.9*  --  10.5*  HCT 33.5*  --  33.3*  --  32.2*  PLT 202  --  189  --  185  LABPROT  --   --   --  14.5  --   INR  --   --   --  1.1  --   CREATININE 0.87  --  0.86  --  0.82   < > = values in this interval not displayed.    Estimated Creatinine Clearance: 105.4 mL/min (by C-G formula based on SCr of 0.82 mg/dL).   Medical History: Past Medical History:  Diagnosis Date  . Acute pulmonary embolism ([place]) 04/11/2016  . Anxiety   . Arthritis   . Atrial tachycardia ([place]) 01/06/2016  . Depression   . DTs (delirium tremens) ([place])   . Dyspnea   . ED (erectile dysfunction)   . ETOH abuse   . Hypertension   . Lymphoma, non Hodgkin's 09/25/2011   Stage 2  . Mitral regurgitation 01/20/2016  . Mobitz I 10/09/2015  . Murmur 01/06/2016  . Occasional tremors   . PAC (premature atrial contraction) 10/09/2015  . S/P minimally-invasive mitral valve repair 10/27/2016   Complex valvuloplasty including artificial Gore-tex neochord placement x6 and 34 mm Edwards Physio II ring annuloplasty via right mini thoracotomy approach    Medications:  Medications Prior to Admission  Medication Sig Dispense Refill Last Dose  . enoxaparin (LOVENOX) 150 MG/ML injection Inject 1 mL (150 mg total) into the skin daily for 7  doses. 7 mL 0 01/26/2020 at 2130  . metoprolol succinate (TOPROL-XL) 50 MG 24 hr tablet Take 1 tablet (50 mg total) by mouth daily. Take with or immediately following a meal. (Patient taking differently: Take 50 mg by mouth at bedtime. Take with or immediately following a meal.) 30 tablet 0 01/26/2020 at 2100  . nicotine (NICODERM CQ - DOSED IN MG/24 HOURS) 21 mg/24hr patch Place 1 patch (21 mg total) onto the skin daily. 28 patch 0 Past Week at Unknown time  . oxyCODONE (OXY IR/ROXICODONE) 5 MG immediate release tablet Take 1 tablet (5 mg total) by mouth every 6 (six) hours as needed for moderate pain or breakthrough pain (Hold & Call MD if SBP<90, HR<65, RR<10, O2<90, or altered mental status.). 24 tablet 0 01/27/2020 at Unknown time  . warfarin (COUMADIN) 5 MG tablet Take 1 tablet by mouth daily. Follow-up with [facility] for INR and dosing instructions (Patient taking differently: Take 5 mg by mouth at bedtime. Take 1 tablet by mouth daily. Follow-up with [facility] for INR and dosing instructions) 30 tablet 0 01/26/2020 at 2130  . acetaminophen (TYLENOL) 325 MG tablet Take 2 tablets (650 mg total) by mouth every 6 (six) hours as needed for mild pain (or Fever >/= 101). (  Patient not taking: Reported on 01/27/2020)   Not Taking at Unknown time   Scheduled:  . docusate sodium  100 mg Oral BID  . folic acid  1 mg Oral Daily  . LORazepam  0-4 mg Intravenous Q12H  . metoprolol tartrate  25 mg Oral BID  . multivitamin with minerals  1 tablet Oral Daily  . nicotine  14 mg Transdermal Daily  . thiamine  100 mg Oral Daily   Or  . thiamine  100 mg Intravenous Daily  . Warfarin - Pharmacist Dosing Inpatient   Does not apply q1800    Assessment: 59 y.o male, POD #1 s/p left foot 3rd ray amputation on 3/24.  He was on Warfarin PTA for h/o recurrent PE (04/2016), s/p mitral valve repair (10/2016) (INR on admit 3.8>>2.6 after 2.5mg  Vit K>> INR today is 1.1 PTA warfarin dose 5mg  daily.  He was also  on Lovenox 1.5mg /kg q24h bridge, last taken 01/26/20.  Recent hospital admission 3/5-3/12/21 for cellulitis L foot, s/p  Left foot 1st and 2nd ray amputation.  He was discharged on Lovenox bridge>warfarin  5mg  daily, last [facility] INR check 3/17 INR =1.9 PTA.      Goal of Therapy:  INR 2-3 Heparin level 0.3-0.7 units/ml Monitor platelets by anticoagulation protocol: Yes   Plan:  Start IV Heparin drip 1200 units/hr Warfarin 7.5 mg x1 , give dose this morning.  Check heparin level in 6 hours Daily HL , INR and CBC.  Monitor for signs and symptoms of bleeding.  Nicole Cella, RPh Clinical Pharmacist [phone] Please check AMION for all [place] phone numbers After 10:00 PM, call [place] [phone] 01/31/2020,9:53 AM

## 2020-01-31 NOTE — Progress Notes (Signed)
RE: Leonard Bentley Date of Birth: 2061/02/05    Please be advised that the above-named patient will require a short-term nursing home stay - anticipated 30 days or less for rehabilitation and strengthening.  The plan is for return home.

## 2020-01-31 NOTE — Progress Notes (Signed)
ANTICOAGULATION CONSULT NOTE - Follow Up Consult  Pharmacy Consult for  Heparin Bridge/ Warfarin  Indication:  History of Recurrent PE   Allergies  Allergen Reactions  . Other Other (See Comments)    "Cactus" caused blisters on tongue (if prepared as food)    Patient Measurements: Height: 5\' 9"  (175.3 cm)(office visit 01/23/20) Weight: 195 lb 1.7 oz (88.5 kg)(office visit 01/23/20) IBW/kg (Calculated) : 70.7 Heparin Dosing Weight: 88.4 kg  Vital Signs: Temp: 97.8 F (36.6 C) (03/25 1606) Temp Source: Oral (03/25 1606) BP: 129/91 (03/25 1606) Pulse Rate: 79 (03/25 1606)  Labs: Recent Labs    01/29/20 0057 01/29/20 0057 01/30/20 0234 01/30/20 0836 01/31/20 0258 01/31/20 1656  HGB 10.9*   < > 10.9*  --  10.5*  --   HCT 33.5*  --  33.3*  --  32.2*  --   PLT 202  --  189  --  185  --   LABPROT  --   --   --  14.5  --   --   INR  --   --   --  1.1  --   --   HEPARINUNFRC  --   --   --   --   --  0.20*  CREATININE 0.87  --  0.86  --  0.82  --    < > = values in this interval not displayed.    Estimated Creatinine Clearance: 105.4 mL/min (by C-G formula based on SCr of 0.82 mg/dL).   Medical History: Past Medical History:  Diagnosis Date  . Acute pulmonary embolism ([place]) 04/11/2016  . Anxiety   . Arthritis   . Atrial tachycardia ([place]) 01/06/2016  . Depression   . DTs (delirium tremens) ([place])   . Dyspnea   . ED (erectile dysfunction)   . ETOH abuse   . Hypertension   . Lymphoma, non Hodgkin's 09/25/2011   Stage 2  . Mitral regurgitation 01/20/2016  . Mobitz I 10/09/2015  . Murmur 01/06/2016  . Occasional tremors   . PAC (premature atrial contraction) 10/09/2015  . S/P minimally-invasive mitral valve repair 10/27/2016   Complex valvuloplasty including artificial Gore-tex neochord placement x6 and 34 mm Edwards Physio II ring annuloplasty via right mini thoracotomy approach    Assessment: 59 yr old male, POD #1 S/P L foot 3rd ray amputation on 3/24.  He was on  warfarin PTA for hx of recurrent PE (04/2016), S/P mitral valve repair (10/2016).  Pt had recent hospital admission 3/5-3/12/21 for cellulitis L foot, S/P L foot 1st and 2nd ray amputation. He was discharged on Lovenox bridge > warfarin 5 mg daily, last INR check on 3/17: INR 1.9.  Prior to procedure, pt was on Lovenox 150 mg SQ Q 24 hr bridge (last dose on 01/26/20). INR on admit 3.8>>2.6 after 2.5 mg Vit K>> INR today is 1.1. Pharmacy is consulted to dose heparin and restart warfarin.  Heparin level ~4.5 hrs after starting heparin infusion at 1200 units/hr was 0.20 units/ml, which is below the goal range for this pt. H/H 10.5/32.2, platelets 185. Per RN, no issues with IV or bleeding observed.  Goal of Therapy:  INR 2-3 Heparin level 0.3-0.7 units/ml Monitor platelets by anticoagulation protocol: Yes   Plan:  Increase heparin infusion to 1400 units/hr Check heparin level in 6 hours Warfarin 7.5 mg X 1 today (completed) Monitor daily heparin level, INR, CBC Monitor for signs/symptoms of bleeding  Gillermina Hu, PharmD, BCPS, [facility] Clinical Pharmacist 01/31/2020,5:59 PM

## 2020-01-31 NOTE — TOC Initial Note (Signed)
Transition of Care [facility]) - Initial/Assessment Note    Patient Details  Name: Leonard Bentley MRN: [id number] Date of Birth: 19-Apr-1961  Transition of Care [facility]) CM/SW Contact:    Benard Halsted, [place] Phone Number: 01/31/2020, 12:17 PM  Clinical Narrative:                 CSW spoke with patient regarding discharge planning. Patient requests SNF placement as he has no support at home to help his foot heal. CSW explained potential barrier with patient lacking insurance and there being a waitlist for [employer] [facility] [employer]. Patient expressed understanding and CSW will discuss case with [facility] Director. CSW provided community resources to patient, including ETOH cessation resources. CSW will complete SCAT application with patient to assist with transportation. CSW will reach out to Financial Counseling to see if Medicaid application can be completed. Patient reported that he used to be a glass blower for many years and is worried that he will not be able to return to that field. CSW will continue to follow.     Barriers to Discharge: Continued Medical Work up, SNF Pending bed offer, SNF Pending payor source - LOG   Patient Goals and CMS Choice Patient states their goals for this hospitalization and ongoing recovery are:: Rehab CMS Medicare.gov Compare Post Acute Care list provided to:: Patient Choice offered to / list presented to : Patient  Expected Discharge Plan and Services   In-house Referral: Clinical Social Work, Risk manager Acute Care Choice: [place] Living arrangements for the past 2 months: Single Family Home                                      Prior Living Arrangements/Services Living arrangements for the past 2 months: Single Family Home Lives with:: Self Patient language and need for interpreter reviewed:: Yes        Need for Family Participation in Patient Care: No (Comment) Care giver support system in place?: Yes (comment)    Criminal Activity/Legal Involvement Pertinent to Current Situation/Hospitalization: No - Comment as needed  Activities of Daily Living Home Assistive Devices/Equipment: Cane (specify quad or straight), Walker (specify type) ADL Screening (condition at time of admission) Patient's cognitive ability adequate to safely complete daily activities?: Yes Is the patient deaf or have difficulty hearing?: No Does the patient have difficulty seeing, even when wearing glasses/contacts?: No Does the patient have difficulty concentrating, remembering, or making decisions?: No Patient able to express need for assistance with ADLs?: Yes Does the patient have difficulty dressing or bathing?: No Independently performs ADLs?: Yes (appropriate for developmental age) Does the patient have difficulty walking or climbing stairs?: Yes Weakness of Legs: Both Weakness of Arms/Hands: None  Permission Sought/Granted Permission sought to share information with : Facility Sport and exercise psychologist, Family Supports Permission granted to share information with : Yes, Verbal Permission Granted  Share Information with NAME: Shirlee Limerick  Permission granted to share info w AGENCY: SNFs  Permission granted to share info w Relationship: Friend  Permission granted to share info w Contact Information: [phone]  Emotional Assessment Appearance:: Appears stated age Attitude/Demeanor/Rapport: Engaged, Gracious Affect (typically observed): Accepting, Appropriate, Pleasant Orientation: : Oriented to Self, Oriented to Place, Oriented to  Time, Fluctuating Orientation (Suspected and/or reported Sundowners) Alcohol / Substance Use: Alcohol Use Psych Involvement: No (comment)  Admission diagnosis:  Osteomyelitis ([place]) [M86.9] Sepsis ([place]) [A41.9] Osteomyelitis, unspecified site,  unspecified type [facility]) [M86.9] Patient Active Problem List   Diagnosis Date Noted  . Wound dehiscence, surgical, initial encounter   . Pressure injury of  skin 01/28/2020  . Subacute osteomyelitis, left ankle and foot ([place]) 01/27/2020  . Sepsis ([place]) 01/27/2020  . Cellulitis of left foot   . Left foot infection 01/11/2020  . Chronic combined systolic and diastolic heart failure ([place]) 03/06/2019  . Coagulation disorder ([place]) 03/06/2019  . Impacted cerumen of both ears 02/09/2019  . Osteomyelitis of great toe of left foot ([place]) 01/09/2019  . Foot ulcer, left ([place]) 12/27/2018  . Cellulitis 12/07/2018  . Bleeding from left ear 11/28/2018  . HTN (hypertension) 11/02/2018  . UTI (urinary tract infection) 11/02/2018  . Ecchymosis   . Tachyarrhythmia 08/30/2018  . Hypomagnesemia 08/30/2018  . Recurrent pulmonary emboli ([place])   . Hematuria   . Delirium tremens ([place])   . LFTs abnormal   . Alcoholic hepatitis without ascites   . Alcohol withdrawal ([place]) 06/04/2018  . Dizziness   . Posterior tibial tendon dysfunction 02/21/2018  . Supratherapeutic INR 11/27/2016  . Dehydration 11/27/2016  . SVT (supraventricular tachycardia) ([place])   . S/P minimally-invasive mitral valve repair 10/27/2016  . History of pulmonary embolus (PE) 04/11/2016  . Left sided numbness   . Anxiety 04/02/2016  . Panic attacks 04/02/2016  . Mitral valve prolapse   . Mitral regurgitation 01/20/2016  . Atrial tachycardia ([place]) 01/06/2016  . Murmur 01/06/2016  . [place] I 10/09/2015  . PAC (premature atrial contraction) 10/09/2015  . Arthritis 08/14/2015  . Neuropathy due to chemotherapeutic drug ([place]) 03/09/2013  . Leucopenia 01/31/2013  . Personal history of pulmonary embolism 12/28/2012  . Alcohol abuse 08/24/2012  . Cigarette smoker 08/24/2012  . Hemorrhoid 03/08/2012  . Anemia 12/10/2011  . History of non-Hodgkin's lymphoma 09/25/2011   PCP:  [name], NP Pharmacy:   [facility] [place], [place] - [address] [place] [address] Phone: [phone] Fax:  [phone]  [facility] [place], [place] - [address] [place] [address] Phone: [phone] Fax: [phone]     Social Determinants of Health (SDOH) Interventions    Readmission Risk Interventions Readmission Risk Prevention Plan 01/31/2020 01/16/2020 01/18/2019  Transportation Screening Complete Complete Complete  PCP or Specialist Appt within 5-7 Days Complete Complete -  Home Care Screening Complete Complete -  Medication Review (RN CM) Complete Complete -  Medication Review (RN Care Manager) - - Complete  HRI or Home Care Consult - - Complete  [place] - - Not Applicable  Some recent data might be hidden

## 2020-01-31 NOTE — Progress Notes (Signed)
Physical Therapy Treatment Patient Details Name: Leonard Bentley MRN: [id number] DOB: February 28, 1961 Today's Date: 01/31/2020    History of Present Illness 59 y.o. male with what a recent left foot first and second ray amputation for osteomyelitis and discharged on March 10/27/2020 with history of minimally invasive mitral valve repair, chronic combined systolic and diastolic CHF last EF is improved to 55 to 60%, alcohol abuse, non-Hodgkin's lymphoma stage II in remission presents to the ER with complaint of increasing pain and discharge from the left foot. following L foot first and 2nd ray amputation.   X-rays show fracture involving the left third metatarsal concerning for osteomyelitis. Admitted 01/27/20 for amputation revision sx.     PT Comments    Patient received in bed, looking lethargic. Reports pain medicine were given earlier, reports 5/10 pain. Patient is mod independent with bed mobility. Requires cues and assist to manage lines, but no physical assist. Patient requires cues and min assist for sit to stand transfers. He was able to ambulate 15 feet around bed to recliner using RW, NWB with min guard. He will continue to benefit from skilled PT to improve safety with mobility.       Follow Up Recommendations  SNF     Equipment Recommendations  Rolling walker with 5" wheels    Recommendations for Other Services       Precautions / Restrictions Precautions Precautions: Fall Precaution Comments: wound VAC L foot, watch HR Restrictions Weight Bearing Restrictions: Yes LLE Weight Bearing: Non weight bearing    Mobility  Bed Mobility Overal bed mobility: Modified Independent Bed Mobility: Sidelying to Sit     Supine to sit: Modified independent (Device/Increase time)     General bed mobility comments: Requires cues for sitting straight on side of bed prior to trying to get up.  Transfers Overall transfer level: Needs assistance Equipment used: Rolling walker (2  wheeled) Transfers: Sit to/from Stand Sit to Stand: Min guard         General transfer comment: Min guard for sit to stand from low bed with RW. Cues for RW safely while adhering to precautions  Ambulation/Gait Ambulation/Gait assistance: Min guard Gait Distance (Feet): 15 Feet Assistive device: Rolling walker (2 wheeled) Gait Pattern/deviations: Step-to pattern Gait velocity: decr   General Gait Details: min guard with hopping around bed to recliner. Cues to maintain NWB status   Stairs             Wheelchair Mobility    Modified Rankin (Stroke Patients Only)       Balance Overall balance assessment: Needs assistance Sitting-balance support: Feet supported Sitting balance-Leahy Scale: Good     Standing balance support: Bilateral upper extremity supported;During functional activity Standing balance-Leahy Scale: Fair Standing balance comment: able to maintain NWB during ambulation.                            Cognition Arousal/Alertness: Awake/alert;Lethargic Behavior During Therapy: WFL for tasks assessed/performed Overall Cognitive Status: No family/caregiver present to determine baseline cognitive functioning Area of Impairment: Attention;Safety/judgement;Awareness;Problem solving                   Current Attention Level: Sustained   Following Commands: Follows one step commands consistently;Follows multi-step commands inconsistently;Follows multi-step commands with increased time Safety/Judgement: Decreased awareness of deficits;Decreased awareness of safety Awareness: Intellectual Problem Solving: Slow processing;Difficulty sequencing;Requires verbal cues;Requires tactile cues General Comments: patient reports he is anxious about next steps. Extensive education  regarding safety, maintaining L LE NWB to protect surgery. Patient verbalized understanding.      Exercises      General Comments        Pertinent Vitals/Pain Pain  Assessment: 0-10 Pain Score: 7  Pain Location: L foot Pain Descriptors / Indicators: Aching;Sore;Throbbing Pain Intervention(s): Monitored during session;Patient requesting pain meds-RN notified;Repositioned    Home Living                      Prior Function            PT Goals (current goals can now be found in the care plan section) Acute Rehab PT Goals Patient Stated Goal: to improve PT Goal Formulation: With patient Time For Goal Achievement: 02/12/20 Potential to Achieve Goals: Fair Progress towards PT goals: Progressing toward goals    Frequency    Min 3X/week      PT Plan Current plan remains appropriate    Co-evaluation              AM-PAC PT "6 Clicks" Mobility   Outcome Measure  Help needed turning from your back to your side while in a flat bed without using bedrails?: None Help needed moving from lying on your back to sitting on the side of a flat bed without using bedrails?: None Help needed moving to and from a bed to a chair (including a wheelchair)?: A Little Help needed standing up from a chair using your arms (e.g., wheelchair or bedside chair)?: A Little Help needed to walk in hospital room?: A Little Help needed climbing 3-5 steps with a railing? : Total 6 Click Score: 18    End of Session Equipment Utilized During Treatment: Gait belt Activity Tolerance: Patient tolerated treatment well;Patient limited by pain Patient left: in chair Nurse Communication: Mobility status;Patient requests pain meds PT Visit Diagnosis: Unsteadiness on feet (R26.81);Other abnormalities of gait and mobility (R26.89);Muscle weakness (generalized) (M62.81);Difficulty in walking, not elsewhere classified (R26.2);Pain Pain - Right/Left: Left Pain - part of body: Ankle and joints of foot     Time: [id number] PT Time Calculation (min) (ACUTE ONLY): 23 min  Charges:  $Gait Training: 23-37 mins                     Tieara Flitton, PT,  [facility] 01/31/20,11:13 AM

## 2020-01-31 NOTE — TOC Progression Note (Signed)
Transition of Care [facility]) - Progression Note    Patient Details  Name: Leonard Bentley MRN: [id number] Date of Birth: 15-Feb-1961  Transition of Care [facility]) CM/SW Contact  Ralph Dowdy, [place] Work Phone Number: 01/31/2020, 2:59 PM  Clinical Narrative:    CSW contacted pt's friend Alecia Lemming [phone]) upon pt's request. CSW updated her on pt's condition with his recent toe amputation. Shirlee Limerick said that pt really is not able to go home and that his living environment is unsafe. CSW updated her on his barriers to discharge, which is lack of insurance, which is preventing him from obtaining rehab at this time.Shirlee Limerick said that she called a facility that said that they could take the patient. CSW called the facility ([facility]). [facility] said that they are still reviewing his application. CSW told Shirlee Limerick that a bed is not guaranteed because of his lack of insurance, but that we would see upon their review of the application. Shirlee Limerick urged [place] to try our best, as this patient really needs to go to a facility. CSW said that we gave him resources for housing, food, employment, and transportation. CSW also said that we are getting financial counseling here at the hospital to help him with the Medicaid application.  CSW said that we are doing all that we can for the patient. CSW will continue to update her as needed.     Barriers to Discharge: Continued Medical Work up, SNF Pending bed offer, SNF Pending payor source - LOG  Expected Discharge Plan and Services   In-house Referral: Clinical Social Work, Risk manager Acute Care Choice: [place] Living arrangements for the past 2 months: [place] Determinants of Health (SDOH) Interventions    Readmission Risk Interventions Readmission Risk Prevention Plan 01/31/2020 01/16/2020 01/18/2019  Transportation Screening Complete Complete Complete  PCP or  Specialist Appt within 5-7 Days Complete Complete -  Home Care Screening Complete Complete -  Medication Review (RN CM) Complete Complete -  Medication Review ([place]) - - Complete  HRI or [place] - - Complete  [place] - - Not Applicable  Some recent data might be hidden

## 2020-01-31 NOTE — Progress Notes (Signed)
PROGRESS NOTE  Leonard Bentley TGY:[id number] DOB: 1961/08/17   PCP: [name], NP  Patient is from: Home.  Lives alone.  Uses cane and walker since he was left foot surgery on 01/16/2020.  DOA: 01/27/2020 LOS: 4  Brief Narrative / Interim history: 59 year old male with history of left foot osteomyelitis status post first and second left ray amputation on 01/16/2020 by Dr. [name], combined CHF with last EF improved to 55/60%, mitral valve repair, history of PE on warfarin, stage II NHL in remission, EtOH abuse and tobacco abuse presenting with increased pain and drainage from left foot.  No fever or trauma involved.  Patient was discharged on 3/12.  Not on antibiotic.  In ED, HDS on arrival but became tachycardic to 120s.  100% on room air.  Afebrile.  WBC 3.2.  Anion gap 17.  Otherwise, CBC with differential and CMP without significant finding.  Lactic acid 4.9> 2.7.  HS Trop negative x2.  EKG NSR.  Influenza PCR and COVID-19 negative.  INR 3.8.  CXR without acute finding.  DG left foot with new displaced fracture through the third metatarsal head concerning for pathologic fracture secondary to underlying osteomyelitis, and nonspecific soft tissue swelling about the foot.  Patient received IV vitamin K 2.5 and empiric antibiotics and admitted.  Reportedly orthopedic surgery, Dr. [name] consulted.   Bilateral ABI without PAD.  Patient had left foot third ray surgery amputation, local tissue rearrangement for wound closure 5 x 10 cm and Prevena 13 cm wound VAC dressing by Dr. [name] on 3/24.  Antibiotics discontinued on 01/31/2020.   Subjective: Seen and examined earlier this morning.  No major events overnight of this morning.  Had some numbness and pain in his foot that has improved with pain medication.  No other complaints.  Objective: Vitals:   01/31/20 0354 01/31/20 0748 01/31/20 0758 01/31/20 1243  BP: (!) 118/95  (!) 132/105 94/71  Pulse: 77  82 67  Resp: '18  20 20  '$ Temp: 98.7 F (37.1  C)  97.9 F (36.6 C) 97.6 F (36.4 C)  TempSrc: Oral  Oral Oral  SpO2: 99%  98% 99%  Weight: 88.5 kg 88.5 kg    Height:  '5\' 9"'$  (1.753 m)      Intake/Output Summary (Last 24 hours) at 01/31/2020 1556 Last data filed at 01/31/2020 1145 Gross per 24 hour  Intake 1388.82 ml  Output 1100 ml  Net 288.82 ml   Filed Weights   01/30/20 0419 01/31/20 0354 01/31/20 0748  Weight: 87.2 kg 88.5 kg 88.5 kg    Examination:  GENERAL: No acute distress.  Appears well.  HEENT: MMM.  Vision and hearing grossly intact.  NECK: Supple.  No apparent JVD.  RESP:  No IWOB. Good air movement bilaterally. CVS:  RRR. Heart sounds normal.  ABD/GI/GU: Bowel sounds present. Soft. Non tender.  MSK/EXT:  Moves extremities.  Dressing over left foot DTI. SKIN: Dressing over left foot with Prevena wound VAC.  NEURO: Awake, alert and oriented appropriately.  No apparent focal neuro deficit. PSYCH: Calm. Normal affect.   Procedures:  3/24-Patient had left foot third ray amputation, local tissue rearrangement for wound closure 5 x 10 cm and Prevena 13 cm wound VAC dressing by Dr. [name] on 3/24  Assessment & Plan: Osteomyelitis and pathologic fracture of left third metatarsal bone-CRP 1.2.  ESR within normal. History of first and second ray amputation of left foot by Dr. [name] on 3/10 Sepsis due to osteomyelitis-sepsis physiology resolved. -S/p  third ray amputation and local tissue rearrangement for wound closure by Dr. [name] on 3/24 -COVID-19, influenza PCR and blood cultures negative -Lactic acidosis resolved.  Procalcitonin negative. -Vancomycin and ceftriaxone 3/21-3/25. -Strict nonweightbearing on the left,  Wound VAC for 1 week and SNF -PT/OT  Dark skin discoloration over right first and second toe-bilateral ABI without PAD.  No signs of infection. -Continue monitoring  Chronic diastolic CHF-previously with combined CHF but recovered LV function on echocardiogram in 08/2018.  No acute cardiopulmonary  symptoms.  Does not seem to be on diuretics. -Monitor fluid status  History of PE on warfarin/supratherapeutic INR-INR 3.8> IV vit K 2.5 mg> 2.6> 1.1 -Resume warfarin with heparin bridge.  Mitra valve repair with annular ring prosthesis-stable.  Tachycardia/essential hypertension/history of SVT/PAT/PACs-due to withdrawal?  Resolved. -Continue home metoprolol  Alcohol abuse-reports drinking a bottle of wine a [place].  Last drink was the [place] prior to admission.  -Encouraged cessation -Continue CIWA with as needed Ativan -Continue vitamins  Tobacco use disorder: Reports smoking about a pack a [place]. -Encouraged cessation.  Discussed the importance of this for wound healing -Nicotine patch  Lactic acidosis-likely due to alcohol versus sepsis.  Resolved.  Anion gap: Likely due to lactic acidosis and alcohol.  Resolved.  History of stage II NHL in remission- -outpatient follow-up.  Hypomagnesemia-likely due to alcohol. -Replenish and recheck  Leukopenia-likely due to alcohol.  Resolved. -Continue monitoring  Hypomagnesemia: -Replenish and recheck.  Normocytic anemia: Hgb 10.9, at baseline>> 10.5.  Anemia panel consistent with ACD -Monitor intermittently       Pressure Injury 01/28/20 Buttocks Right Stage 1 -  Intact skin with non-blanchable redness of a localized area usually over a bony prominence. (Active)  01/28/20 1629  Location: Buttocks  Location Orientation: Right  Staging: Stage 1 -  Intact skin with non-blanchable redness of a localized area usually over a bony prominence.  Wound Description (Comments):   Present on Admission: Yes               DVT prophylaxis: SCD Code Status: Full code Family Communication: Patient and/or RN. Available if any question.   Discharge barrier: Subtherapeutic INR and lack of insurance for SNF placement Patient is from: Home Final disposition: SNF when INR is therapeutic and SNF bed available.  We may able to discharge on  warfarin with Lovenox bridge if he gets SNF bed.  Consultants: Orthopedic surgery   Microbiology summarized: COVID-19 negative Influenza PCR negative Blood cultures negative  Sch Meds:  Scheduled Meds: . docusate sodium  100 mg Oral BID  . folic acid  1 mg Oral Daily  . LORazepam  0-4 mg Intravenous Q12H  . metoprolol tartrate  25 mg Oral BID  . multivitamin with minerals  1 tablet Oral Daily  . nicotine  14 mg Transdermal Daily  . thiamine  100 mg Oral Daily   Or  . thiamine  100 mg Intravenous Daily  . Warfarin - Pharmacist Dosing Inpatient   Does not apply q1800   Continuous Infusions: . sodium chloride 75 mL/hr at 01/30/20 1628  . heparin 1,200 Units/hr (01/31/20 1229)  . lactated ringers 10 mL/hr at 01/30/20 1018   PRN Meds:.acetaminophen **OR** acetaminophen, metoprolol tartrate, morphine injection, ondansetron **OR** ondansetron (ZOFRAN) IV, oxyCODONE  Antimicrobials: Anti-infectives (From admission, onward)   Start     Dose/Rate Route Frequency Ordered Stop   01/30/20 0830  ceFAZolin (ANCEF) IVPB 2g/100 mL premix  Status:  Discontinued     2 g 200 mL/hr over 30  Minutes Intravenous To Short Stay 01/30/20 0824 01/30/20 1330   01/28/20 1800  cefTRIAXone (ROCEPHIN) 2 g in sodium chloride 0.9 % 100 mL IVPB     2 g 200 mL/hr over 30 Minutes Intravenous Every 24 hours 01/27/20 2211 01/30/20 1900   01/28/20 0600  vancomycin (VANCOCIN) IVPB 1000 mg/200 mL premix     1,000 mg 200 mL/hr over 60 Minutes Intravenous Every 12 hours 01/27/20 1738 01/31/20 1006   01/27/20 1730  vancomycin (VANCOCIN) IVPB 1000 mg/200 mL premix  Status:  Discontinued     1,000 mg 200 mL/hr over 60 Minutes Intravenous  Once 01/27/20 1725 01/27/20 1726   01/27/20 1730  cefTRIAXone (ROCEPHIN) 2 g in sodium chloride 0.9 % 100 mL IVPB     2 g 200 mL/hr over 30 Minutes Intravenous  Once 01/27/20 1725 01/27/20 2010   01/27/20 1730  vancomycin (VANCOREADY) IVPB 2000 mg/400 mL     2,000 mg 200 mL/hr  over 120 Minutes Intravenous  Once 01/27/20 1726 01/27/20 2219       I have personally reviewed the following labs and images: CBC: Recent Labs  Lab 01/27/20 1524 01/28/20 0800 01/29/20 0057 01/30/20 0234 01/31/20 0258  WBC 3.2* 2.7* 2.8* 3.2* 5.6  NEUTROABS 2.1  --   --   --   --   HGB 13.3 12.2* 10.9* 10.9* 10.5*  HCT 41.3 37.9* 33.5* 33.3* 32.2*  MCV 82.9 84.8 81.7 82.0 82.6  PLT 273 224 202 189 185   BMP &GFR Recent Labs  Lab 01/27/20 1524 01/28/20 0800 01/29/20 0057 01/30/20 0234 01/31/20 0258  NA 136 137 135 137 135  K 4.7 4.4 4.4 4.2 4.1  CL 94* 97* 100 101 102  CO2 '25 26 27 27 24  '$ GLUCOSE 107* 98 126* 123* 117*  BUN <5* '6 7 9 9  '$ CREATININE 0.85 0.91 0.87 0.86 0.82  CALCIUM 9.3 8.9 8.7* 8.7* 8.7*  MG  --  1.9 1.5* 1.9 1.6*  PHOS  --  3.9 3.4 3.0 2.8   Estimated Creatinine Clearance: 105.4 mL/min (by C-G formula based on SCr of 0.82 mg/dL). Liver & Pancreas: Recent Labs  Lab 01/27/20 1524 01/28/20 0800 01/29/20 0057 01/30/20 0234 01/31/20 0258  AST 56* 43*  --   --   --   ALT 42 32  --   --   --   ALKPHOS 60 50  --   --   --   BILITOT 0.9 1.1  --   --   --   PROT 7.1 6.2*  --   --   --   ALBUMIN 3.6 3.2* 2.9* 3.0* 2.8*   No results for input(s): LIPASE, AMYLASE in the last 168 hours. No results for input(s): AMMONIA in the last 168 hours. Diabetic: No results for input(s): HGBA1C in the last 72 hours. Recent Labs  Lab 01/28/20 2121  GLUCAP 156*   Cardiac Enzymes: No results for input(s): CKTOTAL, CKMB, CKMBINDEX, TROPONINI in the last 168 hours. No results for input(s): PROBNP in the last 8760 hours. Coagulation Profile: Recent Labs  Lab 01/27/20 1752 01/28/20 0800 01/30/20 0836  INR 3.8* 2.6* 1.1   Thyroid Function Tests: No results for input(s): TSH, T4TOTAL, FREET4, T3FREE, THYROIDAB in the last 72 hours. Lipid Profile: No results for input(s): CHOL, HDL, LDLCALC, TRIG, CHOLHDL, LDLDIRECT in the last 72 hours. Anemia  Panel: Recent Labs    01/29/20 1604  VITAMINB12 785  FOLATE 18.1  FERRITIN 279  TIBC 259  IRON 113  RETICCTPCT 1.6   Urine analysis:    Component Value Date/Time   COLORURINE YELLOW 01/27/2020 1841   APPEARANCEUR CLEAR 01/27/2020 1841   APPEARANCEUR Clear 08/03/2019 1143   LABSPEC 1.015 01/27/2020 1841   PHURINE 6.0 01/27/2020 1841   GLUCOSEU NEGATIVE 01/27/2020 1841   HGBUR SMALL (A) 01/27/2020 1841   BILIRUBINUR NEGATIVE 01/27/2020 1841   BILIRUBINUR small (A) 08/03/2019 1144   BILIRUBINUR Negative 08/03/2019 1143   KETONESUR 5 (A) 01/27/2020 1841   PROTEINUR 30 (A) 01/27/2020 1841   UROBILINOGEN 1.0 08/03/2019 1144   UROBILINOGEN 1.0 01/25/2013 0825   NITRITE NEGATIVE 01/27/2020 1841   LEUKOCYTESUR NEGATIVE 01/27/2020 1841   Sepsis Labs: Invalid input(s): PROCALCITONIN, [place]  Microbiology: Recent Results (from the past 240 hour(s))  Blood culture (routine x 2)     Status: None (Preliminary result)   Collection Time: 01/27/20  6:07 PM   Specimen: BLOOD RIGHT ARM  Result Value Ref Range Status   Specimen Description BLOOD RIGHT ARM  Final   Special Requests   Final    BOTTLES DRAWN AEROBIC AND ANAEROBIC Blood Culture results may not be optimal due to an excessive volume of blood received in culture bottles   Culture   Final    NO GROWTH 4 DAYS Performed at [facility], [place] [address], [place], [place] [address]    Report Status PENDING  Incomplete  Blood culture (routine x 2)     Status: None (Preliminary result)   Collection Time: 01/27/20  6:07 PM   Specimen: BLOOD LEFT HAND  Result Value Ref Range Status   Specimen Description BLOOD LEFT HAND  Final   Special Requests   Final    BOTTLES DRAWN AEROBIC AND ANAEROBIC Blood Culture results may not be optimal due to an excessive volume of blood received in culture bottles   Culture   Final    NO GROWTH 4 DAYS Performed at [facility], [place] [address], [place], [place] [address]    Report  Status PENDING  Incomplete  Respiratory Panel by RT PCR (Flu A&B, Covid) - Nasopharyngeal Swab     Status: None   Collection Time: 01/27/20  8:09 PM   Specimen: Nasopharyngeal Swab  Result Value Ref Range Status   SARS Coronavirus 2 by RT PCR NEGATIVE NEGATIVE Final    Comment: (NOTE) SARS-CoV-2 target nucleic acids are NOT DETECTED. The SARS-CoV-2 RNA is generally detectable in upper respiratoy specimens during the acute phase of infection. The lowest concentration of SARS-CoV-2 viral copies this assay can detect is 131 copies/mL. A negative result does not preclude SARS-Cov-2 infection and should not be used as the sole basis for treatment or other patient management decisions. A negative result may occur with  improper specimen collection/handling, submission of specimen other than nasopharyngeal swab, presence of viral mutation(s) within the areas targeted by this assay, and inadequate number of viral copies (<131 copies/mL). A negative result must be combined with clinical observations, patient history, and epidemiological information. The expected result is Negative. Fact Sheet for Patients:  PinkCheek.be Fact Sheet for Healthcare Providers:  GravelBags.it This test is not yet ap proved or cleared by the [place] FDA and  has been authorized for detection and/or diagnosis of SARS-CoV-2 by FDA under an Emergency Use Authorization (EUA). This EUA will remain  in effect (meaning this test can be used) for the duration of the COVID-19 declaration under Section 564(b)(1) of the Act, 21 U.S.C. section 360bbb-3(b)(1), unless the authorization is terminated  or revoked sooner.    Influenza A by PCR NEGATIVE NEGATIVE Final   Influenza B by PCR NEGATIVE NEGATIVE Final    Comment: (NOTE) The Xpert Xpress SARS-CoV-2/FLU/RSV assay is intended as an aid in  the diagnosis of influenza from Nasopharyngeal swab specimens and   should not be used as a sole basis for treatment. Nasal washings and  aspirates are unacceptable for Xpert Xpress SARS-CoV-2/FLU/RSV  testing. Fact Sheet for Patients: PinkCheek.be Fact Sheet for Healthcare Providers: GravelBags.it This test is not yet approved or cleared by the [place] FDA and  has been authorized for detection and/or diagnosis of SARS-CoV-2 by  FDA under an Emergency Use Authorization (EUA). This EUA will remain  in effect (meaning this test can be used) for the duration of the  Covid-19 declaration under Section 564(b)(1) of the Act, 21  U.S.C. section 360bbb-3(b)(1), unless the authorization is  terminated or revoked. Performed at [facility], [place] [address], [place], [place] [address]     Radiology Studies: No results found.   Nashea Chumney T. [place]  If 7PM-7AM, please contact night-coverage [email or web address] Password [facility] 01/31/2020, 3:56 PM

## 2020-01-31 NOTE — Progress Notes (Signed)
Patient is postop [place] 1 status post ray amputation.  He says the block has worn off he is now having quite a bit of pain.  He has asked for pain medication.  VAC is functioning with 0 cc.  Patient is very concerned about transition of care and I told him that a Education officer, museum will be coming to speak with him.  He will need follow-up in office in 1 week

## 2020-01-31 NOTE — Progress Notes (Signed)
Patient called out for the nurse several times tonight asking for Ativan and complaining of anxiety.  Ativan r/t CIWA had been discontinued.  Nurse informed patient that I did not have a medication to give for anxiety.  On-call provider, M. Sharlet Salina, paged.  Received order for one time PRN 25mg  Atarax and gave to patient.

## 2020-02-01 ENCOUNTER — Telehealth: Payer: Self-pay

## 2020-02-01 LAB — HEPARIN LEVEL (UNFRACTIONATED)
Heparin Unfractionated: 0.54 IU/mL (ref 0.30–0.70)
Heparin Unfractionated: 0.73 IU/mL — ABNORMAL HIGH (ref 0.30–0.70)

## 2020-02-01 LAB — CBC
HCT: 32.7 % — ABNORMAL LOW (ref 39.0–52.0)
Hemoglobin: 10.6 g/dL — ABNORMAL LOW (ref 13.0–17.0)
MCH: 26.7 pg (ref 26.0–34.0)
MCHC: 32.4 g/dL (ref 30.0–36.0)
MCV: 82.4 fL (ref 80.0–100.0)
Platelets: 174 10*3/uL (ref 150–400)
RBC: 3.97 MIL/uL — ABNORMAL LOW (ref 4.22–5.81)
RDW: 15.5 % (ref 11.5–15.5)
WBC: 4.5 10*3/uL (ref 4.0–10.5)
nRBC: 0 % (ref 0.0–0.2)

## 2020-02-01 LAB — RENAL FUNCTION PANEL
Albumin: 2.9 g/dL — ABNORMAL LOW (ref 3.5–5.0)
Anion gap: 9 (ref 5–15)
BUN: 10 mg/dL (ref 6–20)
CO2: 25 mmol/L (ref 22–32)
Calcium: 8.7 mg/dL — ABNORMAL LOW (ref 8.9–10.3)
Chloride: 104 mmol/L (ref 98–111)
Creatinine, Ser: 0.83 mg/dL (ref 0.61–1.24)
GFR calc Af Amer: >60 mL/min (ref >60)
GFR calc non Af Amer: >60 mL/min (ref >60)
Glucose, Bld: 107 mg/dL — ABNORMAL HIGH (ref 70–99)
Phosphorus: 3.9 mg/dL (ref 2.5–4.6)
Potassium: 3.9 mmol/L (ref 3.5–5.1)
Sodium: 138 mmol/L (ref 135–145)

## 2020-02-01 LAB — CULTURE, BLOOD (ROUTINE X 2)
Culture: NO GROWTH
Culture: NO GROWTH

## 2020-02-01 LAB — PROTIME-INR
INR: 1.2 (ref 0.8–1.2)
Prothrombin Time: 15 seconds (ref 11.4–15.2)

## 2020-02-01 LAB — MAGNESIUM: Magnesium: 1.6 mg/dL — ABNORMAL LOW (ref 1.7–2.4)

## 2020-02-01 MED ORDER — TRAZODONE HCL 50 MG PO TABS
100.0000 mg | ORAL_TABLET | Freq: Every evening | ORAL | Status: AC | PRN
  Administered 2020-02-01 – 2020-02-03 (×3): 100 mg via ORAL
  Filled 2020-02-01 (×3): qty 2

## 2020-02-01 MED ORDER — WARFARIN SODIUM 7.5 MG PO TABS
7.5000 mg | ORAL_TABLET | Freq: Once | ORAL | Status: AC
  Administered 2020-02-01: 7.5 mg via ORAL
  Filled 2020-02-01: qty 1

## 2020-02-01 NOTE — Progress Notes (Signed)
ANTICOAGULATION CONSULT NOTE - Follow Up Consult  Pharmacy Consult for  Heparin Bridge/ Warfarin  Indication:  History of Recurrent PE   Allergies  Allergen Reactions  . Other Other (See Comments)    "Cactus" caused blisters on tongue (if prepared as food)    Patient Measurements: Height: 5\' 9"  (175.3 cm)(office visit 01/23/20) Weight: 195 lb 1.7 oz (88.5 kg)(office visit 01/23/20) IBW/kg (Calculated) : 70.7 Heparin Dosing Weight: 88.4 kg  Vital Signs: Temp: 98 F (36.7 C) (03/25 1933) Temp Source: Oral (03/25 1933) BP: 121/99 (03/25 1933) Pulse Rate: 97 (03/25 1933)  Labs: Recent Labs    01/30/20 0234 01/30/20 0234 01/30/20 0836 01/31/20 0258 01/31/20 1656 01/31/20 2335  HGB 10.9*   < >  --  10.5*  --  10.6*  HCT 33.3*  --   --  32.2*  --  32.7*  PLT 189  --   --  185  --  174  LABPROT  --   --  14.5  --   --  15.0  INR  --   --  1.1  --   --  1.2  HEPARINUNFRC  --   --   --   --  0.20* 0.54  CREATININE 0.86  --   --  0.82  --  0.83   < > = values in this interval not displayed.    Estimated Creatinine Clearance: 104.1 mL/min (by C-G formula based on SCr of 0.83 mg/dL).   Medical History: Past Medical History:  Diagnosis Date  . Acute pulmonary embolism ([place]) 04/11/2016  . Anxiety   . Arthritis   . Atrial tachycardia ([place]) 01/06/2016  . Depression   . DTs (delirium tremens) ([place])   . Dyspnea   . ED (erectile dysfunction)   . ETOH abuse   . Hypertension   . Lymphoma, non Hodgkin's 09/25/2011   Stage 2  . Mitral regurgitation 01/20/2016  . Mobitz I 10/09/2015  . Murmur 01/06/2016  . Occasional tremors   . PAC (premature atrial contraction) 10/09/2015  . S/P minimally-invasive mitral valve repair 10/27/2016   Complex valvuloplasty including artificial Gore-tex neochord placement x6 and 34 mm Edwards Physio II ring annuloplasty via right mini thoracotomy approach    Assessment: 59 yr old male, POD #1 S/P L foot 3rd ray amputation on 3/24.  He was on  warfarin PTA for hx of recurrent PE (04/2016), S/P mitral valve repair (10/2016).  Pt had recent hospital admission 3/5-3/12/21 for cellulitis L foot, S/P L foot 1st and 2nd ray amputation. He was discharged on Lovenox bridge > warfarin 5 mg daily, last INR check on 3/17: INR 1.9.  Prior to procedure, pt was on Lovenox 150 mg SQ Q 24 hr bridge (last dose on 01/26/20). INR on admit 3.8>>2.6 after 2.5 mg Vit K>> INR today is 1.1. Pharmacy is consulted to dose heparin and restart warfarin.  Heparin level ~4.5 hrs after starting heparin infusion at 1200 units/hr was 0.20 units/ml, which is below the goal range for this pt. H/H 10.5/32.2, platelets 185. Per RN, no issues with IV or bleeding observed.  3/26 AM update:  Heparin level therapeutic after rate increase  Goal of Therapy:  INR 2-3 Heparin level 0.3-0.7 units/ml Monitor platelets by anticoagulation protocol: Yes   Plan:  Cont heparin infusion at 1400 units/hr Re-check heparin level in 6-8 hours  Narda Bonds, PharmD, [place] Pharmacist Phone: [phone]

## 2020-02-01 NOTE — Progress Notes (Signed)
Physical Therapy Treatment Patient Details Name: Leonard Bentley MRN: [id number] DOB: January 20, 1961 Today's Date: 02/01/2020    History of Present Illness 59 y.o. male with what a recent left foot first and second ray amputation for osteomyelitis and discharged on March 10/27/2020 with history of minimally invasive mitral valve repair, chronic combined systolic and diastolic CHF last EF is improved to 55 to 60%, alcohol abuse, non-Hodgkin's lymphoma stage II in remission presents to the ER with complaint of increasing pain and discharge from the left foot. following L foot first and 2nd ray amputation.   X-rays show fracture involving the left third metatarsal concerning for osteomyelitis. Admitted 01/27/20 for amputation revision sx and s/p 3rd ray amputation on 01/30/20.    PT Comments    Pt ambulated increased distance today with min A and able to maintain NWB.  However,  Pt with poor insight into limitations and needed cues to turn around and head back to room to prevent severe fatigue.  Pt still fatigued and required 3 standing rest breaks with support. Pt requiring cues for safety and min guard to min A for transfers.  Continue to recommend SNF at d/c to allow for increased compliance with foot surgery and improved safety.  3   Follow Up Recommendations  SNF     Equipment Recommendations  Wheelchair cushion (measurements PT);Wheelchair (measurements PT)    Recommendations for Other Services       Precautions / Restrictions Precautions Precautions: Fall Precaution Comments: wound VAC L foot, watch HR Required Braces or Orthoses: Other Brace Other Brace: post-op shoe Restrictions LLE Weight Bearing: Non weight bearing    Mobility  Bed Mobility Overal bed mobility: Needs Assistance Bed Mobility: Supine to Sit;Sit to Supine     Supine to sit: Supervision Sit to supine: Supervision   General bed mobility comments: cues for line/lead management  Transfers Overall transfer level:  Needs assistance Equipment used: Rolling walker (2 wheeled) Transfers: Sit to/from Stand Sit to Stand: Min assist         General transfer comment: performed x 2 with min A for steadying  Ambulation/Gait Ambulation/Gait assistance: Min assist Gait Distance (Feet): 40 Feet Assistive device: Rolling walker (2 wheeled) Gait Pattern/deviations: Step-to pattern Gait velocity: decr   General Gait Details: Maintained NWB but required min A for steadying when fatigued.  Also, required cues to turn around and head back to room in case he fatigued (pt wanting to go further but was getting [facility]).  Pt was extremely fatigued when he got back to room and required 3 standing rest breaks in last 10' of ambulation with min A for support.   Stairs             Wheelchair Mobility    Modified Rankin (Stroke Patients Only)       Balance Overall balance assessment: Needs assistance Sitting-balance support: Feet supported Sitting balance-Leahy Scale: Good     Standing balance support: Bilateral upper extremity supported;During functional activity Standing balance-Leahy Scale: Poor Standing balance comment: required use of at least 1 UE                            Cognition Arousal/Alertness: Awake/alert Behavior During Therapy: WFL for tasks assessed/performed Overall Cognitive Status: No family/caregiver present to determine baseline cognitive functioning                           Safety/Judgement: Decreased awareness  of safety;Decreased awareness of deficits     General Comments: Pt reports anxiety      Exercises      General Comments General comments (skin integrity, edema, etc.): Pt's VSS on RA but DOE of 3/4 with walking.      Pertinent Vitals/Pain Pain Assessment: No/denies pain    Home Living                      Prior Function            PT Goals (current goals can now be found in the care plan section) Progress towards PT  goals: Progressing toward goals    Frequency    Min 3X/week      PT Plan Current plan remains appropriate    Co-evaluation              AM-PAC PT "6 Clicks" Mobility   Outcome Measure  Help needed turning from your back to your side while in a flat bed without using bedrails?: None Help needed moving from lying on your back to sitting on the side of a flat bed without using bedrails?: None Help needed moving to and from a bed to a chair (including a wheelchair)?: A Little Help needed standing up from a chair using your arms (e.g., wheelchair or bedside chair)?: A Little Help needed to walk in hospital room?: A Little Help needed climbing 3-5 steps with a railing? : Total 6 Click Score: 18    End of Session Equipment Utilized During Treatment: Gait belt Activity Tolerance: Patient tolerated treatment well;Patient limited by pain Patient left: in bed;with call bell/phone within reach Nurse Communication: Mobility status PT Visit Diagnosis: Unsteadiness on feet (R26.81);Other abnormalities of gait and mobility (R26.89);Muscle weakness (generalized) (M62.81);Difficulty in walking, not elsewhere classified (R26.2);Pain Pain - Right/Left: Left Pain - part of body: Ankle and joints of foot     Time: 1430-1454 PT Time Calculation (min) (ACUTE ONLY): 24 min  Charges:  $Gait Training: 8-22 mins $Therapeutic Activity: 8-22 mins                     Maggie Font, PT Acute Rehab Services Pager [phone] [facility] [phone] [facility] [phone]    Leonard Bentley 02/01/2020, 3:28 PM

## 2020-02-01 NOTE — Progress Notes (Signed)
ANTICOAGULATION CONSULT NOTE - Follow Up Consult  Pharmacy Consult for  Heparin Bridge/ Warfarin  Indication:  History of Recurrent PE   Allergies  Allergen Reactions  . Other Other (See Comments)    "Cactus" caused blisters on tongue (if prepared as food)    Patient Measurements: Height: 5\' 9"  (175.3 cm)(office visit 01/23/20) Weight: 195 lb 8.8 oz (88.7 kg) IBW/kg (Calculated) : 70.7 Heparin Dosing Weight: 88.4 kg  Vital Signs:    Labs: Recent Labs    01/30/20 0234 01/30/20 0234 01/30/20 0836 01/31/20 0258 01/31/20 1656 01/31/20 2335 02/01/20 1013  HGB 10.9*   < >  --  10.5*  --  10.6*  --   HCT 33.3*  --   --  32.2*  --  32.7*  --   PLT 189  --   --  185  --  174  --   LABPROT  --   --  14.5  --   --  15.0  --   INR  --   --  1.1  --   --  1.2  --   HEPARINUNFRC  --   --   --   --  0.20* 0.54 0.73*  CREATININE 0.86  --   --  0.82  --  0.83  --    < > = values in this interval not displayed.    Estimated Creatinine Clearance: 104.3 mL/min (by C-G formula based on SCr of 0.83 mg/dL).   Medical History: Past Medical History:  Diagnosis Date  . Acute pulmonary embolism ([place]) 04/11/2016  . Anxiety   . Arthritis   . Atrial tachycardia ([place]) 01/06/2016  . Depression   . DTs (delirium tremens) ([place])   . Dyspnea   . ED (erectile dysfunction)   . ETOH abuse   . Hypertension   . Lymphoma, non Hodgkin's 09/25/2011   Stage 2  . Mitral regurgitation 01/20/2016  . Mobitz I 10/09/2015  . Murmur 01/06/2016  . Occasional tremors   . PAC (premature atrial contraction) 10/09/2015  . S/P minimally-invasive mitral valve repair 10/27/2016   Complex valvuloplasty including artificial Gore-tex neochord placement x6 and 34 mm Edwards Physio II ring annuloplasty via right mini thoracotomy approach    Assessment: 59 yr old male, POD #1 S/P L foot 3rd ray amputation on 3/24.  He was on warfarin PTA for hx of recurrent PE (04/2016), S/P mitral valve repair (10/2016).  Pt had  recent hospital admission 3/5-3/12/21 for cellulitis L foot, S/P L foot 1st and 2nd ray amputation. He was discharged on Lovenox bridge > warfarin 5 mg daily, last INR check on 3/17: INR 1.9.  Prior to procedure/ prior to admission pt was on Lovenox 150 mg SQ Q 24 hr bridge (last dose on 01/26/20). INR on admit 3.8>>2.6 after 2.5 mg Vit K>> INR on 01/31/20 was 1.1. Pharmacy is consulted to dose heparin and restart warfarin.   INR today = 1.2  Heparin level was 0.54 therapeutic on heparin rate 1400 units/hr.   Next heparin level ~ 10:00 this AM has increased to 0.73 on same heparin rate.  HL slightly above the top end of goal 0.3-0.7 units/ml.  No bleeding reported.  H/H stable with Hgb 10.6, pltc wnl.    Goal of Therapy:  INR 2-3 Heparin level 0.3-0.7 units/ml Monitor platelets by anticoagulation protocol: Yes   Plan:  Decrease heparin infusion at 1300 units/hr Give Warfarin 7.5 mg today x1  Daily Heparin level, INR and CBC   Thank  you for allowing pharmacy to be part of this patients care team.  Nicole Cella, RPh Clinical Pharmacist  Please check AMION for all [place] phone numbers After 10:00 PM, call [place] 02/01/2020 11:41 AM

## 2020-02-01 NOTE — Progress Notes (Signed)
Occupational Therapy Evaluation  Clinical Impressions: Pt progressing with OT goals, first session after most recent 3rd ray amputation. Pt reports increased pain, but eager to participate in OT session. Pt Modified Independent for bed mobility and increased time. Pt min guard for sit to stand from bedside with RW, min guard for initial mobility to bathroom with RW and minor cues for sequencing. Pt Min A for toileting task with assistance needed for clothing mgmt and maintaining standing balance/precautions. Pt min guard for hand hygiene standing at sink, but pt noted with increased fatigue and difficulty maintaining safety precautions. Pt asked "Can I put weight on my foot for a minute?". Increased cues for safety, precautions, and sequencing needed to safely ambulate back to bed at [place] with RW. Continue to recommend SNF for short term rehab due to deficits noted during ADLs above. Will continue to follow acutely.    02/01/20 0800  OT Visit Information  Last OT Received On 02/01/20  Assistance Needed +1  History of Present Illness 59 y.o. male with what a recent left foot first and second ray amputation for osteomyelitis and discharged on March 10/27/2020 with history of minimally invasive mitral valve repair, chronic combined systolic and diastolic CHF last EF is improved to 55 to 60%, alcohol abuse, non-Hodgkin's lymphoma stage II in remission presents to the ER with complaint of increasing pain and discharge from the left foot. following L foot first and 2nd ray amputation.   X-rays show fracture involving the left third metatarsal concerning for osteomyelitis. Admitted 01/27/20 for amputation revision sx.   Precautions  Precautions Fall  Precaution Comments wound VAC L foot, watch HR  Pain Assessment  Pain Assessment 0-10  Pain Score 7  Pain Location L foot  Pain Descriptors / Indicators Aching;Sore;Throbbing  Pain Intervention(s)  (Notified RN)  Cognition  Arousal/Alertness Awake/alert   Behavior During Therapy WFL for tasks assessed/performed  Overall Cognitive Status No family/caregiver present to determine baseline cognitive functioning  Area of Impairment Safety/judgement;Following commands;Awareness  Following Commands Follows one step commands consistently;Follows multi-step commands inconsistently;Follows multi-step commands with increased time  Safety/Judgement Decreased awareness of deficits;Decreased awareness of safety  Problem Solving Difficulty sequencing;Requires verbal cues  General Comments Pt with increasing anxiety with fatigue. Asked "Is it ok if I put some weight on my foot for a minute?". THen put L knee in walker for "stability, but OT had to hold walker steady. Pt with decreased adherence to safety precautions this session  Upper Extremity Assessment  Upper Extremity Assessment Overall WFL for tasks assessed  Lower Extremity Assessment  Lower Extremity Assessment Defer to PT evaluation  ADL  Overall ADL's  Needs assistance/impaired  Grooming Min guard;Wash/dry hands  Grooming Details (indicate cue type and reason) Standing at sink  Toilet Transfer Minimal assistance;Ambulation;Regular Radio producer Details (indicate cue type and reason) Min A for RW mgmt when exiting bathroom  Toileting- Clothing Manipulation and Hygiene Minimal assistance;Sit to/from stand  Toileting - Clothing Manipulation Details (indicate cue type and reason) Min A for clothing mgmt and maintenance of balance/precautions  Functional mobility during ADLs Minimal assistance;Rolling walker;Cueing for safety;Cueing for sequencing  General ADL Comments Pt with more difficulty following commands/precautions today and more shakey in standing, decreased awareness of deficits and need to follow precautions  Bed Mobility  Overal bed mobility Modified Independent  Bed Mobility Supine to Sit;Sit to Supine  Balance  Overall balance assessment Needs assistance  Sitting-balance  support Feet supported  Sitting balance-Leahy Scale Good  Standing  balance support Bilateral upper extremity supported;During functional activity  Standing balance-Leahy Scale Fair  Standing balance comment difficulty maintaining NWB when fatiguing   Restrictions  Weight Bearing Restrictions Yes  LLE Weight Bearing NWB  Transfers  Overall transfer level Needs assistance  Equipment used Rolling walker (2 wheeled)  Transfers Sit to/from [employer] Transfers  Sit to Stand Min guard  Stand pivot transfers Min assist;Min guard  General transfer comment MIn A for walker mgmt  General Comments  General comments (skin integrity, edema, etc.) HR up to 142 with exertion, 90% O2 on RA  OT - End of Session  Equipment Utilized During Treatment Gait belt;Rolling walker  Activity Tolerance Patient tolerated treatment well  Patient left in bed;with call bell/phone within reach  Nurse Communication Mobility status;Other (comment) (pain level)  OT Assessment/Plan  OT Plan Discharge plan remains appropriate;Frequency remains appropriate  OT Visit Diagnosis Unsteadiness on feet (R26.81);Other abnormalities of gait and mobility (R26.89);Pain  Pain - Right/Left Left  Pain - part of body Ankle and joints of foot  OT Frequency (ACUTE ONLY) Min 2X/week  Follow Up Recommendations SNF;Supervision/Assistance - 24 hour  OT Equipment 3 in 1 bedside commode  AM-PAC OT "6 Clicks" Daily Activity Outcome Measure (Version 2)  Help from another person eating meals? 4  Help from another person taking care of personal grooming? 3  Help from another person toileting, which includes using toliet, bedpan, or urinal? 3  Help from another person bathing (including washing, rinsing, drying)? 3  Help from another person to put on and taking off regular upper body clothing? 3  Help from another person to put on and taking off regular lower body clothing? 3  6 Click Score 19  OT Goal Progression  Progress towards OT  goals Progressing toward goals  Acute Rehab OT Goals  Patient Stated Goal to improve  OT Goal Formulation With patient  Time For Goal Achievement 02/14/20  Potential to Achieve Goals Good  ADL Goals  Pt Will Perform Grooming standing;with set-up  Pt Will Perform Lower Body Bathing sit to/from stand;sitting/lateral leans;with set-up  Pt Will Perform Lower Body Dressing with set-up;sit to/from stand;sitting/lateral leans  Pt Will Transfer to Toilet with supervision;ambulating;regular height toilet  Pt Will Perform Toileting - Clothing Manipulation and hygiene with modified independence;sitting/lateral leans;sit to/from stand  OT Time Calculation  OT Start Time (ACUTE ONLY) 0834  OT Stop Time (ACUTE ONLY) 0910  OT Time Calculation (min) 36 min  OT General Charges  $OT Visit 1 Visit  OT Treatments  $Self Care/Home Management  23-37 mins

## 2020-02-01 NOTE — Progress Notes (Signed)
PROGRESS NOTE  Leonard Bentley ZOX:[id number] DOB: [birth year]   PCP: [name], NP  Patient is from: Home.  Lives alone.  Uses cane and walker since he was left foot surgery on 01/16/2020.  DOA: 01/27/2020 LOS: 5  Brief Narrative / Interim history: 59 year old male with history of left foot osteomyelitis status post first and second left ray amputation on 01/16/2020 by Dr. [name], combined CHF with last EF improved to 55/60%, mitral valve repair, history of PE on warfarin, stage II NHL in remission, EtOH abuse and tobacco abuse presenting with increased pain and drainage from left foot.  No fever or trauma involved.  Patient was discharged on 3/12.  Not on antibiotic.  In ED, HDS on arrival but became tachycardic to 120s.  100% on room air.  Afebrile.  WBC 3.2.  Anion gap 17.  Otherwise, CBC with differential and CMP without significant finding.  Lactic acid 4.9> 2.7.  HS Trop negative x2.  EKG NSR.  Influenza PCR and COVID-19 negative.  INR 3.8.  CXR without acute finding.  DG left foot with new displaced fracture through the third metatarsal head concerning for pathologic fracture secondary to underlying osteomyelitis, and nonspecific soft tissue swelling about the foot.  Patient received IV vitamin K 2.5 and empiric antibiotics and admitted.  Reportedly orthopedic surgery, Dr. [name] consulted.   Bilateral ABI without PAD.  Patient had left foot third ray surgery amputation, local tissue rearrangement for wound closure 5 x 10 cm and Prevena 13 cm wound VAC dressing by Dr. [name] on 3/24.  Antibiotics discontinued on 01/31/2020.   Subjective: Seen and examined earlier this morning.  No major events overnight or this morning.  Reports about 8/10 pain in his left foot.  Pain improved with pain medication.  No other complaints.  He denies chest pain, dyspnea, dizziness, GI or UTI symptoms. Objective: Vitals:   01/31/20 1606 01/31/20 1933 02/01/20 0449 02/01/20 0700  BP: (!) 129/91 (!) 121/99      Pulse: 79 97    Resp: 20 18    Temp: 97.8 F (36.6 C) 98 F (36.7 C)    TempSrc: Oral Oral    SpO2: 100% 96%  97%  Weight:   88.7 kg   Height:        Intake/Output Summary (Last 24 hours) at 02/01/2020 1540 Last data filed at 01/31/2020 2142 Gross per 24 hour  Intake 480 ml  Output 400 ml  Net 80 ml   Filed Weights   01/31/20 0354 01/31/20 0748 02/01/20 0449  Weight: 88.5 kg 88.5 kg 88.7 kg    Examination:  GENERAL: No acute distress.  Appears well.  HEENT: MMM.  Vision and hearing grossly intact.  NECK: Supple.  No apparent JVD.  RESP:  No IWOB. Good air movement bilaterally. CVS:  RRR. Heart sounds normal.  ABD/GI/GU: Bowel sounds present. Soft. Non tender.  MSK/EXT: Moves extremities.  Dressing over left foot DCI.  SKIN: Dressing over left foot with Prevena wound VAC. NEURO: Awake, alert and oriented appropriately.  No apparent focal neuro deficit. PSYCH: Calm. Normal affect.  Procedures:  3/24-Patient had left foot third ray amputation, local tissue rearrangement for wound closure 5 x 10 cm and Prevena 13 cm wound VAC dressing by Dr. [name] on 3/24  Assessment & Plan: Osteomyelitis and pathologic fracture of left third metatarsal bone-CRP 1.2.  ESR within normal. History of first and second ray amputation of left foot by Dr. [name] on 3/10 Sepsis due to osteomyelitis-sepsis physiology  resolved. -S/p third ray amputation and local tissue rearrangement for wound closure by Dr. [name] on 3/24 -COVID-19, influenza PCR and blood cultures negative -Lactic acidosis resolved.  Procalcitonin negative. -Vancomycin and ceftriaxone 3/21-3/25. -Strict nonweightbearing on the left.  Ortho to remove wound VAC. -PT/OT-SNF.  Dark skin discoloration over right first and second toe-bilateral ABI without PAD.  No signs of infection. -Continue monitoring  Chronic diastolic CHF-previously with combined CHF but recovered LV function on echocardiogram in 08/2018.  No acute cardiopulmonary  symptoms.  Does not seem to be on diuretics. -Monitor fluid status  History of PE on warfarin/supratherapeutic INR-INR 3.8> IV vit K 2.5 mg> 2.6> 1.1> 1.2 -Warfarin with heparin bridge.  Mitra valve repair with annular ring prosthesis in 2017-has been on warfarin -Anticoagulation as above.  Tachycardia/essential hypertension/history of SVT/PAT/PACs-due to withdrawal?  Resolved. -Continue home metoprolol  Alcohol abuse-reports drinking a bottle of wine a [place].  Last drink was the [place] prior to admission.  -Encouraged cessation -Continue CIWA with as needed Ativan -Continue vitamins  Tobacco use disorder: Reports smoking about a pack a [place]. -Encouraged cessation.  Discussed the importance of this for wound healing -Nicotine patch  Lactic acidosis-likely due to alcohol versus sepsis.  Resolved.  Anion gap: Likely due to lactic acidosis and alcohol.  Resolved.  History of stage II NHL in remission- -outpatient follow-up.  Hypomagnesemia-likely due to alcohol.  Replenished.  Leukopenia-likely due to alcohol.  Resolved. -Continue monitoring  Normocytic anemia: Hgb 10.9, at baseline>> 10.5.  Anemia panel consistent with ACD -Monitor intermittently       Pressure Injury 01/28/20 Buttocks Right Stage 1 -  Intact skin with non-blanchable redness of a localized area usually over a bony prominence. (Active)  01/28/20 1629  Location: Buttocks  Location Orientation: Right  Staging: Stage 1 -  Intact skin with non-blanchable redness of a localized area usually over a bony prominence.  Wound Description (Comments):   Present on Admission: Yes               DVT prophylaxis: SCD Code Status: Full code Family Communication: Patient and/or RN. Available if any question.   Discharge barrier: Subtherapeutic INR and lack of insurance for SNF placement Patient is from: Home Final disposition: SNF when INR is therapeutic and SNF bed available.   Consultants: Orthopedic  surgery   Microbiology summarized: COVID-19 negative Influenza PCR negative Blood cultures negative  Sch Meds:  Scheduled Meds: . docusate sodium  100 mg Oral BID  . folic acid  1 mg Oral Daily  . metoprolol tartrate  25 mg Oral BID  . multivitamin with minerals  1 tablet Oral Daily  . nicotine  14 mg Transdermal Daily  . thiamine  100 mg Oral Daily   Or  . thiamine  100 mg Intravenous Daily  . warfarin  7.5 mg Oral ONCE-1800  . Warfarin - Pharmacist Dosing Inpatient   Does not apply q1800   Continuous Infusions: . sodium chloride 75 mL/hr at 02/01/20 1222  . heparin 1,300 Units/hr (02/01/20 1225)  . lactated ringers 10 mL/hr at 01/30/20 1018   PRN Meds:.acetaminophen **OR** acetaminophen, metoprolol tartrate, morphine injection, ondansetron **OR** ondansetron (ZOFRAN) IV, oxyCODONE  Antimicrobials: Anti-infectives (From admission, onward)   Start     Dose/Rate Route Frequency Ordered Stop   01/30/20 0830  ceFAZolin (ANCEF) IVPB 2g/100 mL premix  Status:  Discontinued     2 g 200 mL/hr over 30 Minutes Intravenous To Short Stay 01/30/20 0824 01/30/20 1330   01/28/20 1800  cefTRIAXone (ROCEPHIN) 2 g in sodium chloride 0.9 % 100 mL IVPB     2 g 200 mL/hr over 30 Minutes Intravenous Every 24 hours 01/27/20 2211 01/30/20 1900   01/28/20 0600  vancomycin (VANCOCIN) IVPB 1000 mg/200 mL premix     1,000 mg 200 mL/hr over 60 Minutes Intravenous Every 12 hours 01/27/20 1738 01/31/20 1006   01/27/20 1730  vancomycin (VANCOCIN) IVPB 1000 mg/200 mL premix  Status:  Discontinued     1,000 mg 200 mL/hr over 60 Minutes Intravenous  Once 01/27/20 1725 01/27/20 1726   01/27/20 1730  cefTRIAXone (ROCEPHIN) 2 g in sodium chloride 0.9 % 100 mL IVPB     2 g 200 mL/hr over 30 Minutes Intravenous  Once 01/27/20 1725 01/27/20 2010   01/27/20 1730  vancomycin (VANCOREADY) IVPB 2000 mg/400 mL     2,000 mg 200 mL/hr over 120 Minutes Intravenous  Once 01/27/20 1726 01/27/20 2219       I have  personally reviewed the following labs and images: CBC: Recent Labs  Lab 01/27/20 1524 01/27/20 1524 01/28/20 0800 01/29/20 0057 01/30/20 0234 01/31/20 0258 01/31/20 2335  WBC 3.2*   < > 2.7* 2.8* 3.2* 5.6 4.5  NEUTROABS 2.1  --   --   --   --   --   --   HGB 13.3   < > 12.2* 10.9* 10.9* 10.5* 10.6*  HCT 41.3   < > 37.9* 33.5* 33.3* 32.2* 32.7*  MCV 82.9   < > 84.8 81.7 82.0 82.6 82.4  PLT 273   < > 224 202 189 185 174   < > = values in this interval not displayed.   BMP &GFR Recent Labs  Lab 01/28/20 0800 01/29/20 0057 01/30/20 0234 01/31/20 0258 01/31/20 2335  NA 137 135 137 135 138  K 4.4 4.4 4.2 4.1 3.9  CL 97* 100 101 102 104  CO2 '26 27 27 24 25  '$ GLUCOSE 98 126* 123* 117* 107*  BUN '6 7 9 9 10  '$ CREATININE 0.91 0.87 0.86 0.82 0.83  CALCIUM 8.9 8.7* 8.7* 8.7* 8.7*  MG 1.9 1.5* 1.9 1.6* 1.6*  PHOS 3.9 3.4 3.0 2.8 3.9   Estimated Creatinine Clearance: 104.3 mL/min (by C-G formula based on SCr of 0.83 mg/dL). Liver & Pancreas: Recent Labs  Lab 01/27/20 1524 01/27/20 1524 01/28/20 0800 01/29/20 0057 01/30/20 0234 01/31/20 0258 01/31/20 2335  AST 56*  --  43*  --   --   --   --   ALT 42  --  32  --   --   --   --   ALKPHOS 60  --  50  --   --   --   --   BILITOT 0.9  --  1.1  --   --   --   --   PROT 7.1  --  6.2*  --   --   --   --   ALBUMIN 3.6   < > 3.2* 2.9* 3.0* 2.8* 2.9*   < > = values in this interval not displayed.   No results for input(s): LIPASE, AMYLASE in the last 168 hours. No results for input(s): AMMONIA in the last 168 hours. Diabetic: No results for input(s): HGBA1C in the last 72 hours. Recent Labs  Lab 01/28/20 2121  GLUCAP 156*   Cardiac Enzymes: No results for input(s): CKTOTAL, CKMB, CKMBINDEX, TROPONINI in the last 168 hours. No results for input(s): PROBNP in the last 8760 hours.  Coagulation Profile: Recent Labs  Lab 01/27/20 1752 01/28/20 0800 01/30/20 0836 01/31/20 2335  INR 3.8* 2.6* 1.1 1.2   Thyroid Function  Tests: No results for input(s): TSH, T4TOTAL, FREET4, T3FREE, THYROIDAB in the last 72 hours. Lipid Profile: No results for input(s): CHOL, HDL, LDLCALC, TRIG, CHOLHDL, LDLDIRECT in the last 72 hours. Anemia Panel: Recent Labs    01/29/20 1604  VITAMINB12 785  FOLATE 18.1  FERRITIN 279  TIBC 259  IRON 113  RETICCTPCT 1.6   Urine analysis:    Component Value Date/Time   COLORURINE YELLOW 01/27/2020 1841   APPEARANCEUR CLEAR 01/27/2020 1841   APPEARANCEUR Clear 08/03/2019 1143   LABSPEC 1.015 01/27/2020 1841   PHURINE 6.0 01/27/2020 1841   GLUCOSEU NEGATIVE 01/27/2020 1841   HGBUR SMALL (A) 01/27/2020 1841   BILIRUBINUR NEGATIVE 01/27/2020 1841   BILIRUBINUR small (A) 08/03/2019 1144   BILIRUBINUR Negative 08/03/2019 1143   KETONESUR 5 (A) 01/27/2020 1841   PROTEINUR 30 (A) 01/27/2020 1841   UROBILINOGEN 1.0 08/03/2019 1144   UROBILINOGEN 1.0 01/25/2013 0825   NITRITE NEGATIVE 01/27/2020 1841   LEUKOCYTESUR NEGATIVE 01/27/2020 1841   Sepsis Labs: Invalid input(s): PROCALCITONIN, [place]  Microbiology: Recent Results (from the past 240 hour(s))  Blood culture (routine x 2)     Status: None   Collection Time: 01/27/20  6:07 PM   Specimen: BLOOD RIGHT ARM  Result Value Ref Range Status   Specimen Description BLOOD RIGHT ARM  Final   Special Requests   Final    BOTTLES DRAWN AEROBIC AND ANAEROBIC Blood Culture results may not be optimal due to an excessive volume of blood received in culture bottles   Culture   Final    NO GROWTH 5 DAYS Performed at [facility], [place] [address] [place], [place] [address]    Report Status 02/01/2020 FINAL  Final  Blood culture (routine x 2)     Status: None   Collection Time: 01/27/20  6:07 PM   Specimen: BLOOD LEFT HAND  Result Value Ref Range Status   Specimen Description BLOOD LEFT HAND  Final   Special Requests   Final    BOTTLES DRAWN AEROBIC AND ANAEROBIC Blood Culture results may not be optimal due to an excessive  volume of blood received in culture bottles   Culture   Final    NO GROWTH 5 DAYS Performed at [facility], [place] [address], [place], [place] [address]    Report Status 02/01/2020 FINAL  Final  Respiratory Panel by RT PCR (Flu A&B, Covid) - Nasopharyngeal Swab     Status: None   Collection Time: 01/27/20  8:09 PM   Specimen: Nasopharyngeal Swab  Result Value Ref Range Status   SARS Coronavirus 2 by RT PCR NEGATIVE NEGATIVE Final    Comment: (NOTE) SARS-CoV-2 target nucleic acids are NOT DETECTED. The SARS-CoV-2 RNA is generally detectable in upper respiratoy specimens during the acute phase of infection. The lowest concentration of SARS-CoV-2 viral copies this assay can detect is 131 copies/mL. A negative result does not preclude SARS-Cov-2 infection and should not be used as the sole basis for treatment or other patient management decisions. A negative result may occur with  improper specimen collection/handling, submission of specimen other than nasopharyngeal swab, presence of viral mutation(s) within the areas targeted by this assay, and inadequate number of viral copies (<131 copies/mL). A negative result must be combined with clinical observations, patient history, and epidemiological information. The expected result is Negative. Fact  Sheet for Patients:  PinkCheek.be Fact Sheet for Healthcare Providers:  GravelBags.it This test is not yet ap proved or cleared by the [place] FDA and  has been authorized for detection and/or diagnosis of SARS-CoV-2 by FDA under an Emergency Use Authorization (EUA). This EUA will remain  in effect (meaning this test can be used) for the duration of the COVID-19 declaration under Section 564(b)(1) of the Act, 21 U.S.C. section 360bbb-3(b)(1), unless the authorization is terminated or revoked sooner.    Influenza A by PCR NEGATIVE NEGATIVE Final   Influenza B by PCR NEGATIVE  NEGATIVE Final    Comment: (NOTE) The Xpert Xpress SARS-CoV-2/FLU/RSV assay is intended as an aid in  the diagnosis of influenza from Nasopharyngeal swab specimens and  should not be used as a sole basis for treatment. Nasal washings and  aspirates are unacceptable for Xpert Xpress SARS-CoV-2/FLU/RSV  testing. Fact Sheet for Patients: PinkCheek.be Fact Sheet for Healthcare Providers: GravelBags.it This test is not yet approved or cleared by the [place] FDA and  has been authorized for detection and/or diagnosis of SARS-CoV-2 by  FDA under an Emergency Use Authorization (EUA). This EUA will remain  in effect (meaning this test can be used) for the duration of the  Covid-19 declaration under Section 564(b)(1) of the Act, 21  U.S.C. section 360bbb-3(b)(1), unless the authorization is  terminated or revoked. Performed at [facility], [place] [address] [place], [place] [address]     Radiology Studies: No results found.   Lamiracle Chaidez T. Velma  If 7PM-7AM, please contact night-coverage [email or web address] Password [facility] 02/01/2020, 3:40 PM

## 2020-02-01 NOTE — TOC Progression Note (Signed)
Transition of Care [facility]) - Progression Note    Patient Details  Name: Leonard Bentley MRN: [id number] Date of Birth: [birth year]  [facility]) CM/SW Cassia, LCSW Phone Number: 02/01/2020, 5:08 PM  Clinical Narrative:    CSW spoke with patient and completed PASRR screening with reviewer. Patient very pleasant and completed SCAT application with CSW. Per Financial Counseling, patient does not have a diagnosis for disability or Medicaid, which will be a barrier to SNF placement. CSW will continue to follow.      Barriers to Discharge: Continued Medical Work up, SNF Pending bed offer, SNF Pending payor source - LOG  Expected Discharge Plan and Services   In-house Referral: Clinical Social Work, Risk manager Acute Care Choice: [place] Living arrangements for the past 2 months: [place] Determinants of Health (SDOH) Interventions    Readmission Risk Interventions Readmission Risk Prevention Plan 01/31/2020 01/16/2020 01/18/2019  Transportation Screening Complete Complete Complete  PCP or Specialist Appt within 5-7 Days Complete Complete -  Home Care Screening Complete Complete -  Medication Review (RN CM) Complete Complete -  Medication Review ([place]) - - Complete  [place] or [place] - - Complete  [place] - - Not Applicable  Some recent data might be hidden

## 2020-02-01 NOTE — Telephone Encounter (Signed)
Jenny Reichmann, patient's nurse at the hospital called. He said that the wound vac is beeping and is not familiar this with particular wound vac. He also states that the patient has some questions as well. Please call as soon as you can. He can be reached at [phone].

## 2020-02-01 NOTE — Telephone Encounter (Signed)
Spoke with nurse. Will remove wound vac and apply dry dressing

## 2020-02-01 NOTE — Telephone Encounter (Signed)
Pt is s/p a third ray amputation 01/30/20 please see message form hospital below.

## 2020-02-01 NOTE — Consult Note (Signed)
WOC Nurse Consult Note: Patient receiving care in [facility] [phone]. Reason for Consult: VAC is beeping and patient has questions I spoke via telephone with Apolonio Schneiders, Therapist, sports, covering for [employer].  I explained that the patient is under the services of Dr. [name] and to please direct all questions and concerns to Dr. [name] or his PA. Val Riles, RN, MSN, CWOCN, CNS-BC, pager [phone]

## 2020-02-02 LAB — RENAL FUNCTION PANEL
Albumin: 2.7 g/dL — ABNORMAL LOW (ref 3.5–5.0)
Anion gap: 8 (ref 5–15)
BUN: 8 mg/dL (ref 6–20)
CO2: 25 mmol/L (ref 22–32)
Calcium: 8.7 mg/dL — ABNORMAL LOW (ref 8.9–10.3)
Chloride: 105 mmol/L (ref 98–111)
Creatinine, Ser: 0.69 mg/dL (ref 0.61–1.24)
GFR calc Af Amer: >60 mL/min (ref >60)
GFR calc non Af Amer: >60 mL/min (ref >60)
Glucose, Bld: 112 mg/dL — ABNORMAL HIGH (ref 70–99)
Phosphorus: 3.5 mg/dL (ref 2.5–4.6)
Potassium: 3.7 mmol/L (ref 3.5–5.1)
Sodium: 138 mmol/L (ref 135–145)

## 2020-02-02 LAB — CBC
HCT: 29.7 % — ABNORMAL LOW (ref 39.0–52.0)
Hemoglobin: 9.6 g/dL — ABNORMAL LOW (ref 13.0–17.0)
MCH: 27.1 pg (ref 26.0–34.0)
MCHC: 32.3 g/dL (ref 30.0–36.0)
MCV: 83.9 fL (ref 80.0–100.0)
Platelets: 163 10*3/uL (ref 150–400)
RBC: 3.54 MIL/uL — ABNORMAL LOW (ref 4.22–5.81)
RDW: 15.7 % — ABNORMAL HIGH (ref 11.5–15.5)
WBC: 4.3 10*3/uL (ref 4.0–10.5)
nRBC: 0 % (ref 0.0–0.2)

## 2020-02-02 LAB — PROTIME-INR
INR: 1.2 (ref 0.8–1.2)
Prothrombin Time: 15.4 seconds — ABNORMAL HIGH (ref 11.4–15.2)

## 2020-02-02 LAB — HEPARIN LEVEL (UNFRACTIONATED): Heparin Unfractionated: 0.58 IU/mL (ref 0.30–0.70)

## 2020-02-02 LAB — MAGNESIUM: Magnesium: 1.5 mg/dL — ABNORMAL LOW (ref 1.7–2.4)

## 2020-02-02 MED ORDER — MAGNESIUM SULFATE 2 GM/50ML IV SOLN
2.0000 g | Freq: Once | INTRAVENOUS | Status: AC
  Administered 2020-02-02: 2 g via INTRAVENOUS
  Filled 2020-02-02: qty 50

## 2020-02-02 MED ORDER — WARFARIN SODIUM 7.5 MG PO TABS
7.5000 mg | ORAL_TABLET | Freq: Once | ORAL | Status: AC
  Administered 2020-02-02: 7.5 mg via ORAL
  Filled 2020-02-02: qty 1

## 2020-02-02 NOTE — Progress Notes (Signed)
PROGRESS NOTE  Leonard Bentley KNL:[id number] DOB: [birth year]   PCP: [name], NP  Patient is from: Home.  Lives alone.  Uses cane and walker since he was left foot surgery on 01/16/2020.  DOA: 01/27/2020 LOS: 6  Brief Narrative / Interim history: 59 year old male with history of left foot osteomyelitis status post first and second left ray amputation on 01/16/2020 by Dr. [name], combined CHF with last EF improved to 55/60%, mitral valve repair, history of PE on warfarin, stage II NHL in remission, EtOH abuse and tobacco abuse presenting with increased pain and drainage from left foot.  No fever or trauma involved.  Patient was discharged on 3/12.  Not on antibiotic.  In ED, HDS on arrival but became tachycardic to 120s.  100% on room air.  Afebrile.  WBC 3.2.  Anion gap 17.  Otherwise, CBC with differential and CMP without significant finding.  Lactic acid 4.9> 2.7.  HS Trop negative x2.  EKG NSR.  Influenza PCR and COVID-19 negative.  INR 3.8.  CXR without acute finding.  DG left foot with new displaced fracture through the third metatarsal head concerning for pathologic fracture secondary to underlying osteomyelitis, and nonspecific soft tissue swelling about the foot.  Patient received IV vitamin K 2.5 and empiric antibiotics and admitted.  Reportedly orthopedic surgery, Dr. [name] consulted.   Bilateral ABI without PAD.  Patient had left foot third ray surgery amputation, local tissue rearrangement for wound closure 5 x 10 cm and Prevena 13 cm wound VAC dressing by Dr. [name] on 3/24.  Antibiotics discontinued on 01/31/2020.  Wound VAC removed on 3/26.  Plan to discharge to SNF when bed available and INR therapeutic.  Difficult disposition due to lack of insurance.    Subjective: Seen and examined earlier this morning.  No major events overnight or this morning.  Pain fairly controlled.  Denies chest pain, dyspnea, GI or UTI symptoms.  Objective: Vitals:   02/01/20 1713 02/01/20 2121  02/02/20 0604 02/02/20 0808  BP: (!) 128/94 (!) 126/98 (!) 130/97   Pulse: 70 79 99   Resp: '18 19 17   '$ Temp: 98.2 F (36.8 C) 98.6 F (37 C) 98.6 F (37 C)   TempSrc: Oral Oral Oral   SpO2: 100% 96% 95% 95%  Weight:      Height:        Intake/Output Summary (Last 24 hours) at 02/02/2020 1400 Last data filed at 02/02/2020 0900 Gross per 24 hour  Intake 480 ml  Output --  Net 480 ml   Filed Weights   01/31/20 0354 01/31/20 0748 02/01/20 0449  Weight: 88.5 kg 88.5 kg 88.7 kg    Examination:  GENERAL: No acute distress.  Appears well.  HEENT: MMM.  Vision and hearing grossly intact.  NECK: Supple.  No apparent JVD.  RESP:  No IWOB. Good air movement bilaterally. CVS:  RRR. Heart sounds normal.  ABD/GI/GU: Bowel sounds present. Soft. Non tender.  MSK/EXT:  Moves extremities.  Dressing over left fourth DCI. SKIN: Dressing over left fourth DCI. NEURO: Awake, alert and oriented appropriately.  No apparent focal neuro deficit. PSYCH: Calm. Normal affect.  Procedures:  3/24-Patient had left foot third ray amputation, local tissue rearrangement for wound closure 5 x 10 cm and Prevena 13 cm wound VAC dressing by Dr. [name] on 3/24  Assessment & Plan: Osteomyelitis and pathologic fracture of left third metatarsal bone-CRP 1.2.  ESR within normal. History of first and second ray amputation of left foot by  Dr. [name] on 01/16/20 Sepsis due to osteomyelitis-sepsis physiology resolved. -S/p third ray amputation and local tissue rearrangement for wound closure by Dr. [name] on 3/24 -COVID-19, influenza PCR and blood cultures negative -Lactic acidosis resolved.  Procalcitonin negative. -Vancomycin and ceftriaxone 3/21-3/25. -Strict nonweightbearing on the left.  -PT/OT-SNF.  Dark skin discoloration over right first and second toe-bilateral ABI without PAD.  No signs of infection.  Chronic diastolic CHF-previously with combined CHF but recovered LV function on echocardiogram in 08/2018.  No  acute cardiopulmonary symptoms.  Euvolemic.  Does not seem to be on diuretics. -Monitor fluid status  History of PE on warfarin/supratherapeutic INR-INR 3.8> IV vit K 2.5 mg> 2.6> 1.1> 1.2 -Warfarin with heparin bridge.  Mitra valve repair with annular ring prosthesis in 2017-has been on warfarin -Anticoagulation as above.  Tachycardia/essential hypertension/history of SVT/PAT/PACs-due to withdrawal?  Resolved. -Continue home metoprolol  Alcohol abuse-reports drinking a bottle of wine a [place].  Last drink was the [place] prior to admission.  -Encouraged cessation -Continue CIWA with as needed Ativan -Continue vitamins  Tobacco use disorder: Reports smoking about a pack a [place]. -Encouraged cessation.  Discussed the importance of this for wound healing -Nicotine patch  Lactic acidosis-likely due to alcohol versus sepsis.  Resolved.  Anion gap: Likely due to lactic acidosis and alcohol.  Resolved.  History of stage II NHL in remission- -outpatient follow-up.  Hypomagnesemia-likely due to alcohol.  -Replenish.  Leukopenia-likely due to alcohol.  Resolved. -Continue monitoring  Normocytic anemia: Hgb 10.9, at baseline>> 10.5.  Anemia panel consistent with ACD -Monitor intermittently       Pressure Injury 01/28/20 Buttocks Right Stage 1 -  Intact skin with non-blanchable redness of a localized area usually over a bony prominence. (Active)  01/28/20 1629  Location: Buttocks  Location Orientation: Right  Staging: Stage 1 -  Intact skin with non-blanchable redness of a localized area usually over a bony prominence.  Wound Description (Comments):   Present on Admission: Yes               DVT prophylaxis: On warfarin with heparin bridge for PE Code Status: Full code Family Communication: Patient and/or RN. Available if any question.   Discharge barrier: Subtherapeutic INR and SNF bed.  Difficult placement due to lack of insurance. Patient is from: Home Final disposition:  SNF when INR is therapeutic and SNF bed available.   Consultants: Orthopedic surgery   Microbiology summarized: COVID-19 negative Influenza PCR negative Blood cultures negative  Sch Meds:  Scheduled Meds: . docusate sodium  100 mg Oral BID  . folic acid  1 mg Oral Daily  . metoprolol tartrate  25 mg Oral BID  . multivitamin with minerals  1 tablet Oral Daily  . nicotine  14 mg Transdermal Daily  . thiamine  100 mg Oral Daily   Or  . thiamine  100 mg Intravenous Daily  . warfarin  7.5 mg Oral ONCE-1600  . Warfarin - Pharmacist Dosing Inpatient   Does not apply q1800   Continuous Infusions: . heparin 1,300 Units/hr (02/02/20 0404)  . lactated ringers 10 mL/hr at 02/02/20 0359   PRN Meds:.acetaminophen **OR** acetaminophen, metoprolol tartrate, morphine injection, ondansetron **OR** ondansetron (ZOFRAN) IV, oxyCODONE, traZODone  Antimicrobials: Anti-infectives (From admission, onward)   Start     Dose/Rate Route Frequency Ordered Stop   01/30/20 0830  ceFAZolin (ANCEF) IVPB 2g/100 mL premix  Status:  Discontinued     2 g 200 mL/hr over 30 Minutes Intravenous To Short Stay 01/30/20 [phone]  01/30/20 1330   01/28/20 1800  cefTRIAXone (ROCEPHIN) 2 g in sodium chloride 0.9 % 100 mL IVPB     2 g 200 mL/hr over 30 Minutes Intravenous Every 24 hours 01/27/20 2211 01/30/20 1900   01/28/20 0600  vancomycin (VANCOCIN) IVPB 1000 mg/200 mL premix     1,000 mg 200 mL/hr over 60 Minutes Intravenous Every 12 hours 01/27/20 1738 01/31/20 1006   01/27/20 1730  vancomycin (VANCOCIN) IVPB 1000 mg/200 mL premix  Status:  Discontinued     1,000 mg 200 mL/hr over 60 Minutes Intravenous  Once 01/27/20 1725 01/27/20 1726   01/27/20 1730  cefTRIAXone (ROCEPHIN) 2 g in sodium chloride 0.9 % 100 mL IVPB     2 g 200 mL/hr over 30 Minutes Intravenous  Once 01/27/20 1725 01/27/20 2010   01/27/20 1730  vancomycin (VANCOREADY) IVPB 2000 mg/400 mL     2,000 mg 200 mL/hr over 120 Minutes Intravenous  Once  01/27/20 1726 01/27/20 2219       I have personally reviewed the following labs and images: CBC: Recent Labs  Lab 01/27/20 1524 01/28/20 0800 01/29/20 0057 01/30/20 0234 01/31/20 0258 01/31/20 2335 02/02/20 0222  WBC 3.2*   < > 2.8* 3.2* 5.6 4.5 4.3  NEUTROABS 2.1  --   --   --   --   --   --   HGB 13.3   < > 10.9* 10.9* 10.5* 10.6* 9.6*  HCT 41.3   < > 33.5* 33.3* 32.2* 32.7* 29.7*  MCV 82.9   < > 81.7 82.0 82.6 82.4 83.9  PLT 273   < > 202 189 185 174 163   < > = values in this interval not displayed.   BMP &GFR Recent Labs  Lab 01/29/20 0057 01/30/20 0234 01/31/20 0258 01/31/20 2335 02/02/20 0222  NA 135 137 135 138 138  K 4.4 4.2 4.1 3.9 3.7  CL 100 101 102 104 105  CO2 '27 27 24 25 25  '$ GLUCOSE 126* 123* 117* 107* 112*  BUN '7 9 9 10 8  '$ CREATININE 0.87 0.86 0.82 0.83 0.69  CALCIUM 8.7* 8.7* 8.7* 8.7* 8.7*  MG 1.5* 1.9 1.6* 1.6* 1.5*  PHOS 3.4 3.0 2.8 3.9 3.5   Estimated Creatinine Clearance: 108.2 mL/min (by C-G formula based on SCr of 0.69 mg/dL). Liver & Pancreas: Recent Labs  Lab 01/27/20 1524 01/27/20 1524 01/28/20 0800 01/28/20 0800 01/29/20 0057 01/30/20 0234 01/31/20 0258 01/31/20 2335 02/02/20 0222  AST 56*  --  43*  --   --   --   --   --   --   ALT 42  --  32  --   --   --   --   --   --   ALKPHOS 60  --  50  --   --   --   --   --   --   BILITOT 0.9  --  1.1  --   --   --   --   --   --   PROT 7.1  --  6.2*  --   --   --   --   --   --   ALBUMIN 3.6   < > 3.2*   < > 2.9* 3.0* 2.8* 2.9* 2.7*   < > = values in this interval not displayed.   No results for input(s): LIPASE, AMYLASE in the last 168 hours. No results for input(s): AMMONIA in the last 168 hours. Diabetic:  No results for input(s): HGBA1C in the last 72 hours. Recent Labs  Lab 01/28/20 2121  GLUCAP 156*   Cardiac Enzymes: No results for input(s): CKTOTAL, CKMB, CKMBINDEX, TROPONINI in the last 168 hours. No results for input(s): PROBNP in the last 8760  hours. Coagulation Profile: Recent Labs  Lab 01/27/20 1752 01/28/20 0800 01/30/20 0836 01/31/20 2335 02/02/20 0222  INR 3.8* 2.6* 1.1 1.2 1.2   Thyroid Function Tests: No results for input(s): TSH, T4TOTAL, FREET4, T3FREE, THYROIDAB in the last 72 hours. Lipid Profile: No results for input(s): CHOL, HDL, LDLCALC, TRIG, CHOLHDL, LDLDIRECT in the last 72 hours. Anemia Panel: No results for input(s): VITAMINB12, FOLATE, FERRITIN, TIBC, IRON, RETICCTPCT in the last 72 hours. Urine analysis:    Component Value Date/Time   COLORURINE YELLOW 01/27/2020 1841   APPEARANCEUR CLEAR 01/27/2020 1841   APPEARANCEUR Clear 08/03/2019 1143   LABSPEC 1.015 01/27/2020 1841   PHURINE 6.0 01/27/2020 1841   GLUCOSEU NEGATIVE 01/27/2020 1841   HGBUR SMALL (A) 01/27/2020 1841   BILIRUBINUR NEGATIVE 01/27/2020 1841   BILIRUBINUR small (A) 08/03/2019 1144   BILIRUBINUR Negative 08/03/2019 1143   KETONESUR 5 (A) 01/27/2020 1841   PROTEINUR 30 (A) 01/27/2020 1841   UROBILINOGEN 1.0 08/03/2019 1144   UROBILINOGEN 1.0 01/25/2013 0825   NITRITE NEGATIVE 01/27/2020 1841   LEUKOCYTESUR NEGATIVE 01/27/2020 1841   Sepsis Labs: Invalid input(s): PROCALCITONIN, [place]  Microbiology: Recent Results (from the past 240 hour(s))  Blood culture (routine x 2)     Status: None   Collection Time: 01/27/20  6:07 PM   Specimen: BLOOD RIGHT ARM  Result Value Ref Range Status   Specimen Description BLOOD RIGHT ARM  Final   Special Requests   Final    BOTTLES DRAWN AEROBIC AND ANAEROBIC Blood Culture results may not be optimal due to an excessive volume of blood received in culture bottles   Culture   Final    NO GROWTH 5 DAYS Performed at [facility], [place] [address] [place], [place] [address]    Report Status 02/01/2020 FINAL  Final  Blood culture (routine x 2)     Status: None   Collection Time: 01/27/20  6:07 PM   Specimen: BLOOD LEFT HAND  Result Value Ref Range Status   Specimen Description  BLOOD LEFT HAND  Final   Special Requests   Final    BOTTLES DRAWN AEROBIC AND ANAEROBIC Blood Culture results may not be optimal due to an excessive volume of blood received in culture bottles   Culture   Final    NO GROWTH 5 DAYS Performed at [facility], [place] [address], [place], [name] [address]    Report Status 02/01/2020 FINAL  Final  Respiratory Panel by RT PCR (Flu A&B, Covid) - Nasopharyngeal Swab     Status: None   Collection Time: 01/27/20  8:09 PM   Specimen: Nasopharyngeal Swab  Result Value Ref Range Status   SARS Coronavirus 2 by RT PCR NEGATIVE NEGATIVE Final    Comment: (NOTE) SARS-CoV-2 target nucleic acids are NOT DETECTED. The SARS-CoV-2 RNA is generally detectable in upper respiratoy specimens during the acute phase of infection. The lowest concentration of SARS-CoV-2 viral copies this assay can detect is 131 copies/mL. A negative result does not preclude SARS-Cov-2 infection and should not be used as the sole basis for treatment or other patient management decisions. A negative result may occur with  improper specimen collection/handling, submission of specimen other than nasopharyngeal swab, presence of  viral mutation(s) within the areas targeted by this assay, and inadequate number of viral copies (<131 copies/mL). A negative result must be combined with clinical observations, patient history, and epidemiological information. The expected result is Negative. Fact Sheet for Patients:  PinkCheek.be Fact Sheet for Healthcare Providers:  GravelBags.it This test is not yet ap proved or cleared by the [place] FDA and  has been authorized for detection and/or diagnosis of SARS-CoV-2 by FDA under an Emergency Use Authorization (EUA). This EUA will remain  in effect (meaning this test can be used) for the duration of the COVID-19 declaration under Section 564(b)(1) of the Act, 21 U.S.C. section  360bbb-3(b)(1), unless the authorization is terminated or revoked sooner.    Influenza A by PCR NEGATIVE NEGATIVE Final   Influenza B by PCR NEGATIVE NEGATIVE Final    Comment: (NOTE) The Xpert Xpress SARS-CoV-2/FLU/RSV assay is intended as an aid in  the diagnosis of influenza from Nasopharyngeal swab specimens and  should not be used as a sole basis for treatment. Nasal washings and  aspirates are unacceptable for Xpert Xpress SARS-CoV-2/FLU/RSV  testing. Fact Sheet for Patients: PinkCheek.be Fact Sheet for Healthcare Providers: GravelBags.it This test is not yet approved or cleared by the [place] FDA and  has been authorized for detection and/or diagnosis of SARS-CoV-2 by  FDA under an Emergency Use Authorization (EUA). This EUA will remain  in effect (meaning this test can be used) for the duration of the  Covid-19 declaration under Section 564(b)(1) of the Act, 21  U.S.C. section 360bbb-3(b)(1), unless the authorization is  terminated or revoked. Performed at [facility], [place] [address] [place], [place] [address]     Radiology Studies: No results found.   Suly Vukelich T. Donnellson  If 7PM-7AM, please contact night-coverage [email or web address] Password [email or web address] 02/02/2020, 2:00 PM

## 2020-02-02 NOTE — Plan of Care (Signed)

## 2020-02-02 NOTE — Progress Notes (Signed)
ANTICOAGULATION CONSULT NOTE - Follow Up Consult  Pharmacy Consult for  Heparin Bridge/ Warfarin  Indication:  History of Recurrent PE   Allergies  Allergen Reactions  . Other Other (See Comments)    "Cactus" caused blisters on tongue (if prepared as food)    Patient Measurements: Height: 5\' 9"  (175.3 cm)(office visit 01/23/20) Weight: 195 lb 8.8 oz (88.7 kg) IBW/kg (Calculated) : 70.7 Heparin Dosing Weight: 88.4 kg  Vital Signs: Temp: 98.6 F (37 C) (03/27 0604) Temp Source: Oral (03/27 0604) BP: 130/97 (03/27 0604) Pulse Rate: 99 (03/27 0604)  Labs: Recent Labs     0000 01/30/20 0836 01/31/20 0258 01/31/20 1656 01/31/20 2335 02/01/20 1013 02/02/20 0222  HGB   < >  --  10.5*  --  10.6*  --  9.6*  HCT  --   --  32.2*  --  32.7*  --  29.7*  PLT  --   --  185  --  174  --  163  LABPROT  --  14.5  --   --  15.0  --  15.4*  INR  --  1.1  --   --  1.2  --  1.2  HEPARINUNFRC  --   --   --    < > 0.54 0.73* 0.58  CREATININE  --   --  0.82  --  0.83  --  0.69   < > = values in this interval not displayed.    Estimated Creatinine Clearance: 108.2 mL/min (by C-G formula based on SCr of 0.69 mg/dL).   Medical History: Past Medical History:  Diagnosis Date  . Acute pulmonary embolism ([place]) 04/11/2016  . Anxiety   . Arthritis   . Atrial tachycardia ([place]) 01/06/2016  . Depression   . DTs (delirium tremens) ([place])   . Dyspnea   . ED (erectile dysfunction)   . ETOH abuse   . Hypertension   . Lymphoma, non Hodgkin's 09/25/2011   Stage 2  . Mitral regurgitation 01/20/2016  . Mobitz I 10/09/2015  . Murmur 01/06/2016  . Occasional tremors   . PAC (premature atrial contraction) 10/09/2015  . S/P minimally-invasive mitral valve repair 10/27/2016   Complex valvuloplasty including artificial Gore-tex neochord placement x6 and 34 mm Edwards Physio II ring annuloplasty via right mini thoracotomy approach    Assessment: 59 yr old male, POD #1 S/P L foot 3rd ray amputation on  3/24.  He was on warfarin PTA for hx of recurrent PE (04/2016), S/P mitral valve repair (10/2016).  Pt had recent hospital admission 3/5-3/12/21 for cellulitis L foot, S/P L foot 1st and 2nd ray amputation. He was discharged on Lovenox bridge > warfarin 5 mg daily, last INR check on 3/17: INR 1.9.  Prior to procedure/ prior to admission pt was on Lovenox 150 mg SQ Q 24 hr bridge (last dose on 01/26/20). INR on admit 3.8>>2.6 after 2.5 mg Vit K>> INR on 01/31/20 was 1.1. Pharmacy is consulted to dose heparin and restart warfarin.  Heparin level of 0.58 is therapeutic on heparin 1300 units/hr. CBC stable. No reported bleeding. INR 1.2 is subtherapeutic after 2 doses of warfarin.    Goal of Therapy:  INR 2-3 Heparin level 0.3-0.7 units/ml Monitor platelets by anticoagulation protocol: Yes   Plan:  Continue heparin infusion at 1300 units/hr  Warfarin 7.5 mg today x1  Monitor heparin level, INR, CBC and S/S of bleeding daily    Thank you for allowing pharmacy to be part of this patients care  team.  Cristela Felt, PharmD PGY1 Pharmacy Resident [employer]: [phone]  02/02/2020 7:07 AM

## 2020-02-03 LAB — CBC
HCT: 30 % — ABNORMAL LOW (ref 39.0–52.0)
Hemoglobin: 9.4 g/dL — ABNORMAL LOW (ref 13.0–17.0)
MCH: 26.3 pg (ref 26.0–34.0)
MCHC: 31.3 g/dL (ref 30.0–36.0)
MCV: 84 fL (ref 80.0–100.0)
Platelets: 183 10*3/uL (ref 150–400)
RBC: 3.57 MIL/uL — ABNORMAL LOW (ref 4.22–5.81)
RDW: 15.5 % (ref 11.5–15.5)
WBC: 3.5 10*3/uL — ABNORMAL LOW (ref 4.0–10.5)
nRBC: 0 % (ref 0.0–0.2)

## 2020-02-03 LAB — PROTIME-INR
INR: 1.3 — ABNORMAL HIGH (ref 0.8–1.2)
Prothrombin Time: 16 seconds — ABNORMAL HIGH (ref 11.4–15.2)

## 2020-02-03 LAB — HEPARIN LEVEL (UNFRACTIONATED): Heparin Unfractionated: 0.56 IU/mL (ref 0.30–0.70)

## 2020-02-03 MED ORDER — WARFARIN SODIUM 5 MG PO TABS
5.0000 mg | ORAL_TABLET | Freq: Once | ORAL | Status: AC
  Administered 2020-02-03: 5 mg via ORAL
  Filled 2020-02-03: qty 1

## 2020-02-03 NOTE — Progress Notes (Signed)
PROGRESS NOTE  Leonard Bentley EGB:[id number] DOB: 1961-05-30   PCP: Leonard Pounds, NP  Patient is from: Home.  Lives alone.  Uses cane and walker since he was left foot surgery on 01/16/2020.  DOA: 01/27/2020 LOS: 7  Brief Narrative / Interim history: 59 year old male with history of left foot osteomyelitis status post first and second left ray amputation on 01/16/2020 by Dr. [name], combined CHF with last EF improved to 55/60%, mitral valve repair, history of PE on warfarin, stage II NHL in remission, EtOH abuse and tobacco abuse presenting with increased pain and drainage from left foot.  No fever or trauma involved.  Patient was discharged on 3/12.  Not on antibiotic.  In ED, HDS on arrival but became tachycardic to 120s.  100% on room air.  Afebrile.  WBC 3.2.  Anion gap 17.  Otherwise, CBC with differential and CMP without significant finding.  Lactic acid 4.9> 2.7.  HS Trop negative x2.  EKG NSR.  Influenza PCR and COVID-19 negative.  INR 3.8.  CXR without acute finding.  DG left foot with new displaced fracture through the third metatarsal head concerning for pathologic fracture secondary to underlying osteomyelitis, and nonspecific soft tissue swelling about the foot.  Patient received IV vitamin K 2.5 and empiric antibiotics and admitted.  Reportedly orthopedic surgery, Dr. [name] consulted.   Bilateral ABI without PAD.  Patient had left foot third ray surgery amputation, local tissue rearrangement for wound closure 5 x 10 cm and Prevena 13 cm wound VAC dressing by Dr. [name] on 3/24.  Antibiotics discontinued on 01/31/2020.   Subjective: Patient seen. No new complaints.  Objective: Vitals:   02/02/20 2142 02/03/20 0310 02/03/20 1225 02/03/20 1325  BP: (!) 135/104 (!) 129/112  (!) 126/98  Pulse: 100 84  78  Resp:  15  16  Temp:  97.8 F (36.6 C) 98.4 F (36.9 C)   TempSrc:  Axillary Oral   SpO2:  96%  100%  Weight:      Height:        Intake/Output Summary (Last 24 hours) at  02/03/2020 1458 Last data filed at 02/03/2020 1327 Gross per 24 hour  Intake 240 ml  Output 350 ml  Net -110 ml   Filed Weights   01/31/20 0354 01/31/20 0748 02/01/20 0449  Weight: 88.5 kg 88.5 kg 88.7 kg    Examination: GENERAL: No acute distress.  Appears well.  HEENT: No pallor or jaundice. NECK: Supple.  No apparent JVD.  RESP: Decreased air entry. CVS: S1-S2. ABD/GI/GU: Bowel sounds present. Soft. Non tender.  MSK/EXT: Left foot is wrapped.   NEURO: Awake, alert and oriented appropriately.  Patient moves all extremities  Procedures:  3/24-Patient had left foot third ray amputation, local tissue rearrangement for wound closure 5 x 10 cm and Prevena 13 cm wound VAC dressing by Dr. [name] on 3/24  Assessment & Plan: Osteomyelitis and pathologic fracture of left third metatarsal bone-CRP 1.2.  ESR within normal. History of first and second ray amputation of left foot by Dr. [name] on 3/10 Sepsis due to osteomyelitis-sepsis physiology resolved. -S/p third ray amputation and local tissue rearrangement for wound closure by Dr. [name] on 3/24 -COVID-19, influenza PCR and blood cultures negative -Lactic acidosis resolved.  Procalcitonin negative. -Vancomycin and ceftriaxone 3/21-3/25. -Strict nonweightbearing on the left.  Ortho to remove wound VAC. -PT/OT-SNF. 02/03/2020: Pursue disposition.  Dark skin discoloration over right first and second toe-bilateral ABI without PAD.  No signs of infection. -Continue monitoring  Chronic diastolic CHF-previously with combined CHF but recovered LV function on echocardiogram in 08/2018.  No acute cardiopulmonary symptoms.  Does not seem to be on diuretics. -Monitor fluid status 02/03/2020: Stable.  History of PE on warfarin/supratherapeutic INR-INR 3.8> IV vit K 2.5 mg> 2.6> 1.1> 1.2 -Warfarin with heparin bridge. 02/03/2020: INR is 1.3 today.  Mitra valve repair with annular ring prosthesis in 2017-has been on warfarin -Anticoagulation as  above.  Tachycardia/essential hypertension/history of SVT/PAT/PACs-due to withdrawal?  Resolved. -Continue home metoprolol 02/03/2020: Tachycardia has resolved.  Blood pressure is reasonably controlled.  Continue to optimize.  Alcohol abuse-reports drinking a bottle of wine a [place].  Last drink was the [place] prior to admission.  -Encouraged cessation -Continue CIWA with as needed Ativan -Continue vitamins 02/03/2020: Stable.  Tobacco use disorder: Reports smoking about a pack a [place]. -Encouraged cessation.  Discussed the importance of this for wound healing -Nicotine patch 02/03/2020: Stable  Lactic acidosis-likely due to alcohol versus sepsis.  Resolved.  Anion gap: Likely due to lactic acidosis and alcohol.  Resolved.  History of stage II NHL in remission- -outpatient follow-up.  Hypomagnesemia-likely due to alcohol.  Replenished.  Leukopenia-likely due to alcohol.  Resolved. -Continue monitoring  Normocytic anemia: Hgb 10.9, at baseline>> 10.5.  Anemia panel consistent with ACD -Monitor intermittently 02/03/2020: Hemoglobin today is 9.4 g/dL.  Pressure Injury 01/28/20 Buttocks Right Stage 1 -  Intact skin with non-blanchable redness of a localized area usually over a bony prominence. (Active)  01/28/20 1629  Location: Buttocks  Location Orientation: Right  Staging: Stage 1 -  Intact skin with non-blanchable redness of a localized area usually over a bony prominence.  Wound Description (Comments):   Present on Admission: Yes     DVT prophylaxis: SCD Code Status: Full code Family Communication: Patient and/or RN. Available if any question.   Discharge barrier: Subtherapeutic INR and lack of insurance for SNF placement Patient is from: Home Final disposition: SNF when INR is therapeutic and SNF bed available.   Consultants: Orthopedic surgery   Microbiology summarized: COVID-19 negative Influenza PCR negative Blood cultures negative  Sch Meds:  Scheduled Meds: .  docusate sodium  100 mg Oral BID  . folic acid  1 mg Oral Daily  . metoprolol tartrate  25 mg Oral BID  . multivitamin with minerals  1 tablet Oral Daily  . nicotine  14 mg Transdermal Daily  . thiamine  100 mg Oral Daily   Or  . thiamine  100 mg Intravenous Daily  . warfarin  5 mg Oral ONCE-1600  . Warfarin - Pharmacist Dosing Inpatient   Does not apply q1800   Continuous Infusions: . heparin 1,300 Units/hr (02/02/20 2351)  . lactated ringers 10 mL/hr at 02/02/20 0359   PRN Meds:.acetaminophen **OR** acetaminophen, metoprolol tartrate, morphine injection, ondansetron **OR** ondansetron (ZOFRAN) IV, oxyCODONE, traZODone  Antimicrobials: Anti-infectives (From admission, onward)   Start     Dose/Rate Route Frequency Ordered Stop   01/30/20 0830  ceFAZolin (ANCEF) IVPB 2g/100 mL premix  Status:  Discontinued     2 g 200 mL/hr over 30 Minutes Intravenous To Short Stay 01/30/20 0824 01/30/20 1330   01/28/20 1800  cefTRIAXone (ROCEPHIN) 2 g in sodium chloride 0.9 % 100 mL IVPB     2 g 200 mL/hr over 30 Minutes Intravenous Every 24 hours 01/27/20 2211 01/30/20 1900   01/28/20 0600  vancomycin (VANCOCIN) IVPB 1000 mg/200 mL premix     1,000 mg 200 mL/hr over 60 Minutes Intravenous Every 12 hours  01/27/20 1738 01/31/20 1006   01/27/20 1730  vancomycin (VANCOCIN) IVPB 1000 mg/200 mL premix  Status:  Discontinued     1,000 mg 200 mL/hr over 60 Minutes Intravenous  Once 01/27/20 1725 01/27/20 1726   01/27/20 1730  cefTRIAXone (ROCEPHIN) 2 g in sodium chloride 0.9 % 100 mL IVPB     2 g 200 mL/hr over 30 Minutes Intravenous  Once 01/27/20 1725 01/27/20 2010   01/27/20 1730  vancomycin (VANCOREADY) IVPB 2000 mg/400 mL     2,000 mg 200 mL/hr over 120 Minutes Intravenous  Once 01/27/20 1726 01/27/20 2219       I have personally reviewed the following labs and images: CBC: Recent Labs  Lab 01/27/20 1524 01/28/20 0800 01/30/20 0234 01/31/20 0258 01/31/20 2335 02/02/20 0222  02/03/20 0431  WBC 3.2*   < > 3.2* 5.6 4.5 4.3 3.5*  NEUTROABS 2.1  --   --   --   --   --   --   HGB 13.3   < > 10.9* 10.5* 10.6* 9.6* 9.4*  HCT 41.3   < > 33.3* 32.2* 32.7* 29.7* 30.0*  MCV 82.9   < > 82.0 82.6 82.4 83.9 84.0  PLT 273   < > 189 185 174 163 183   < > = values in this interval not displayed.   BMP &GFR Recent Labs  Lab 01/29/20 0057 01/30/20 0234 01/31/20 0258 01/31/20 2335 02/02/20 0222  NA 135 137 135 138 138  K 4.4 4.2 4.1 3.9 3.7  CL 100 101 102 104 105  CO2 '27 27 24 25 25  '$ GLUCOSE 126* 123* 117* 107* 112*  BUN '7 9 9 10 8  '$ CREATININE 0.87 0.86 0.82 0.83 0.69  CALCIUM 8.7* 8.7* 8.7* 8.7* 8.7*  MG 1.5* 1.9 1.6* 1.6* 1.5*  PHOS 3.4 3.0 2.8 3.9 3.5   Estimated Creatinine Clearance: 108.2 mL/min (by C-G formula based on SCr of 0.69 mg/dL). Liver & Pancreas: Recent Labs  Lab 01/27/20 1524 01/27/20 1524 01/28/20 0800 01/28/20 0800 01/29/20 0057 01/30/20 0234 01/31/20 0258 01/31/20 2335 02/02/20 0222  AST 56*  --  43*  --   --   --   --   --   --   ALT 42  --  32  --   --   --   --   --   --   ALKPHOS 60  --  50  --   --   --   --   --   --   BILITOT 0.9  --  1.1  --   --   --   --   --   --   PROT 7.1  --  6.2*  --   --   --   --   --   --   ALBUMIN 3.6   < > 3.2*   < > 2.9* 3.0* 2.8* 2.9* 2.7*   < > = values in this interval not displayed.   No results for input(s): LIPASE, AMYLASE in the last 168 hours. No results for input(s): AMMONIA in the last 168 hours. Diabetic: No results for input(s): HGBA1C in the last 72 hours. Recent Labs  Lab 01/28/20 2121  GLUCAP 156*   Cardiac Enzymes: No results for input(s): CKTOTAL, CKMB, CKMBINDEX, TROPONINI in the last 168 hours. No results for input(s): PROBNP in the last 8760 hours. Coagulation Profile: Recent Labs  Lab 01/28/20 0800 01/30/20 0836 01/31/20 2335 02/02/20 0222 02/03/20 0431  INR 2.6*  1.1 1.2 1.2 1.3*   Thyroid Function Tests: No results for input(s): TSH, T4TOTAL, FREET4,  T3FREE, THYROIDAB in the last 72 hours. Lipid Profile: No results for input(s): CHOL, HDL, LDLCALC, TRIG, CHOLHDL, LDLDIRECT in the last 72 hours. Anemia Panel: No results for input(s): VITAMINB12, FOLATE, FERRITIN, TIBC, IRON, RETICCTPCT in the last 72 hours. Urine analysis:    Component Value Date/Time   COLORURINE YELLOW 01/27/2020 1841   APPEARANCEUR CLEAR 01/27/2020 1841   APPEARANCEUR Clear 08/03/2019 1143   LABSPEC 1.015 01/27/2020 1841   PHURINE 6.0 01/27/2020 1841   GLUCOSEU NEGATIVE 01/27/2020 1841   HGBUR SMALL (A) 01/27/2020 1841   BILIRUBINUR NEGATIVE 01/27/2020 1841   BILIRUBINUR small (A) 08/03/2019 1144   BILIRUBINUR Negative 08/03/2019 1143   KETONESUR 5 (A) 01/27/2020 1841   PROTEINUR 30 (A) 01/27/2020 1841   UROBILINOGEN 1.0 08/03/2019 1144   UROBILINOGEN 1.0 01/25/2013 0825   NITRITE NEGATIVE 01/27/2020 1841   LEUKOCYTESUR NEGATIVE 01/27/2020 1841   Sepsis Labs: Invalid input(s): PROCALCITONIN, [place]  Microbiology: Recent Results (from the past 240 hour(s))  Blood culture (routine x 2)     Status: None   Collection Time: 01/27/20  6:07 PM   Specimen: BLOOD RIGHT ARM  Result Value Ref Range Status   Specimen Description BLOOD RIGHT ARM  Final   Special Requests   Final    BOTTLES DRAWN AEROBIC AND ANAEROBIC Blood Culture results may not be optimal due to an excessive volume of blood received in culture bottles   Culture   Final    NO GROWTH 5 DAYS Performed at [facility], [place] [address], [place], [place] [address]    Report Status 02/01/2020 FINAL  Final  Blood culture (routine x 2)     Status: None   Collection Time: 01/27/20  6:07 PM   Specimen: BLOOD LEFT HAND  Result Value Ref Range Status   Specimen Description BLOOD LEFT HAND  Final   Special Requests   Final    BOTTLES DRAWN AEROBIC AND ANAEROBIC Blood Culture results may not be optimal due to an excessive volume of blood received in culture bottles   Culture   Final    NO  GROWTH 5 DAYS Performed at [facility], [place] [address], [place], [place] [address]    Report Status 02/01/2020 FINAL  Final  Respiratory Panel by RT PCR (Flu A&B, Covid) - Nasopharyngeal Swab     Status: None   Collection Time: 01/27/20  8:09 PM   Specimen: Nasopharyngeal Swab  Result Value Ref Range Status   SARS Coronavirus 2 by RT PCR NEGATIVE NEGATIVE Final    Comment: (NOTE) SARS-CoV-2 target nucleic acids are NOT DETECTED. The SARS-CoV-2 RNA is generally detectable in upper respiratoy specimens during the acute phase of infection. The lowest concentration of SARS-CoV-2 viral copies this assay can detect is 131 copies/mL. A negative result does not preclude SARS-Cov-2 infection and should not be used as the sole basis for treatment or other patient management decisions. A negative result may occur with  improper specimen collection/handling, submission of specimen other than nasopharyngeal swab, presence of viral mutation(s) within the areas targeted by this assay, and inadequate number of viral copies (<131 copies/mL). A negative result must be combined with clinical observations, patient history, and epidemiological information. The expected result is Negative. Fact Sheet for Patients:  PinkCheek.be Fact Sheet for Healthcare Providers:  GravelBags.it This test is not yet ap proved or cleared by the [place] and  has been authorized for detection and/or diagnosis of SARS-CoV-2 by FDA under an Emergency Use Authorization (EUA). This EUA will remain  in effect (meaning this test can be used) for the duration of the COVID-19 declaration under Section 564(b)(1) of the Act, 21 U.S.C. section 360bbb-3(b)(1), unless the authorization is terminated or revoked sooner.    Influenza A by PCR NEGATIVE NEGATIVE Final   Influenza B by PCR NEGATIVE NEGATIVE Final    Comment: (NOTE) The Xpert Xpress  SARS-CoV-2/FLU/RSV assay is intended as an aid in  the diagnosis of influenza from Nasopharyngeal swab specimens and  should not be used as a sole basis for treatment. Nasal washings and  aspirates are unacceptable for Xpert Xpress SARS-CoV-2/FLU/RSV  testing. Fact Sheet for Patients: PinkCheek.be Fact Sheet for Healthcare Providers: GravelBags.it This test is not yet approved or cleared by the [place] FDA and  has been authorized for detection and/or diagnosis of SARS-CoV-2 by  FDA under an Emergency Use Authorization (EUA). This EUA will remain  in effect (meaning this test can be used) for the duration of the  Covid-19 declaration under Section 564(b)(1) of the Act, 21  U.S.C. section 360bbb-3(b)(1), unless the authorization is  terminated or revoked. Performed at [facility], [place] [address], [place], [place] [address]     Radiology Studies: No results found.   Bonnell Public, MD. [facility]  If 7PM-7AM, please contact night-coverage [email or web address] Password [facility] 02/03/2020, 2:58 PM

## 2020-02-03 NOTE — Plan of Care (Signed)

## 2020-02-03 NOTE — Progress Notes (Signed)
ANTICOAGULATION CONSULT NOTE - Follow Up Consult  Pharmacy Consult for  Heparin Bridge/ Warfarin  Indication:  History of Recurrent PE   Allergies  Allergen Reactions  . Other Other (See Comments)    "Cactus" caused blisters on tongue (if prepared as food)    Patient Measurements: Height: 5\' 9"  (175.3 cm)(office visit 01/23/20) Weight: 195 lb 8.8 oz (88.7 kg) IBW/kg (Calculated) : 70.7 Heparin Dosing Weight: 88.4 kg  Vital Signs: Temp: 97.8 F (36.6 C) (03/28 0310) Temp Source: Axillary (03/28 0310) BP: 129/112 (03/28 0310) Pulse Rate: 84 (03/28 0310)  Labs: Recent Labs    01/31/20 2335 01/31/20 2335 02/01/20 1013 02/02/20 0222 02/03/20 0431  HGB 10.6*   < >  --  9.6* 9.4*  HCT 32.7*  --   --  29.7* 30.0*  PLT 174  --   --  163 183  LABPROT 15.0  --   --  15.4* 16.0*  INR 1.2  --   --  1.2 1.3*  HEPARINUNFRC 0.54   < > 0.73* 0.58 0.56  CREATININE 0.83  --   --  0.69  --    < > = values in this interval not displayed.    Estimated Creatinine Clearance: 108.2 mL/min (by C-G formula based on SCr of 0.69 mg/dL).   Medical History: Past Medical History:  Diagnosis Date  . Acute pulmonary embolism ([place]) 04/11/2016  . Anxiety   . Arthritis   . Atrial tachycardia ([place]) 01/06/2016  . Depression   . DTs (delirium tremens) ([place])   . Dyspnea   . ED (erectile dysfunction)   . ETOH abuse   . Hypertension   . Lymphoma, non Hodgkin's 09/25/2011   Stage 2  . Mitral regurgitation 01/20/2016  . Mobitz I 10/09/2015  . Murmur 01/06/2016  . Occasional tremors   . PAC (premature atrial contraction) 10/09/2015  . S/P minimally-invasive mitral valve repair 10/27/2016   Complex valvuloplasty including artificial Gore-tex neochord placement x6 and 34 mm Edwards Physio II ring annuloplasty via right mini thoracotomy approach    Assessment: 59 yr old male, POD #1 S/P L foot 3rd ray amputation on 3/24.  He was on warfarin PTA for hx of recurrent PE (04/2016), S/P mitral valve repair  (10/2016).  Pt had recent hospital admission 3/5-3/12/21 for cellulitis L foot, S/P L foot 1st and 2nd ray amputation. He was discharged on Lovenox bridge > warfarin 5 mg daily, last INR check on 3/17: INR 1.9.  Prior to procedure/ prior to admission pt was on Lovenox 150 mg SQ Q 24 hr bridge (last dose on 01/26/20). INR on admit 3.8>>2.6 after 2.5 mg Vit K>> INR on 01/31/20 was 1.1. Pharmacy is consulted to dose heparin and restart warfarin.  Heparin level of 0.56 is therapeutic on heparin 1300 units/hr. CBC stable. No reported bleeding. INR 1.3 is subtherapeutic after resuming warfarin but now is trending up.   Goal of Therapy:  INR 2-3 Heparin level 0.3-0.7 units/ml Monitor platelets by anticoagulation protocol: Yes   Plan:  Continue heparin infusion at 1300 units/hr  Warfarin 5 mg today x1  Monitor heparin level, INR, CBC and S/S of bleeding daily    Thank you for allowing pharmacy to be part of this patients care team.  Cristela Felt, PharmD PGY1 Pharmacy Resident [employer]: [phone]  02/03/2020 7:05 AM

## 2020-02-04 ENCOUNTER — Other Ambulatory Visit: Payer: Self-pay | Admitting: Physician Assistant

## 2020-02-04 LAB — CBC
HCT: 29.8 % — ABNORMAL LOW (ref 39.0–52.0)
Hemoglobin: 9.5 g/dL — ABNORMAL LOW (ref 13.0–17.0)
MCH: 26.8 pg (ref 26.0–34.0)
MCHC: 31.9 g/dL (ref 30.0–36.0)
MCV: 83.9 fL (ref 80.0–100.0)
Platelets: 158 10*3/uL (ref 150–400)
RBC: 3.55 MIL/uL — ABNORMAL LOW (ref 4.22–5.81)
RDW: 15.3 % (ref 11.5–15.5)
WBC: 3 10*3/uL — ABNORMAL LOW (ref 4.0–10.5)
nRBC: 0 % (ref 0.0–0.2)

## 2020-02-04 LAB — PROTIME-INR
INR: 1.3 — ABNORMAL HIGH (ref 0.8–1.2)
Prothrombin Time: 16 seconds — ABNORMAL HIGH (ref 11.4–15.2)

## 2020-02-04 LAB — MAGNESIUM: Magnesium: 1.5 mg/dL — ABNORMAL LOW (ref 1.7–2.4)

## 2020-02-04 LAB — HEPARIN LEVEL (UNFRACTIONATED): Heparin Unfractionated: 0.44 IU/mL (ref 0.30–0.70)

## 2020-02-04 MED ORDER — LORAZEPAM 2 MG/ML IJ SOLN
1.0000 mg | INTRAMUSCULAR | Status: AC | PRN
  Administered 2020-02-05 – 2020-02-06 (×3): 1 mg via INTRAVENOUS
  Filled 2020-02-04 (×3): qty 1

## 2020-02-04 MED ORDER — HEPARIN (PORCINE) 25000 UT/250ML-% IV SOLN
1350.0000 [IU]/h | INTRAVENOUS | Status: DC
  Administered 2020-02-06 – 2020-02-10 (×6): 1350 [IU]/h via INTRAVENOUS
  Filled 2020-02-04 (×8): qty 250

## 2020-02-04 MED ORDER — MAGNESIUM SULFATE 4 GM/100ML IV SOLN
4.0000 g | Freq: Once | INTRAVENOUS | Status: AC
  Administered 2020-02-04: 4 g via INTRAVENOUS
  Filled 2020-02-04: qty 100

## 2020-02-04 MED ORDER — WARFARIN SODIUM 7.5 MG PO TABS
7.5000 mg | ORAL_TABLET | Freq: Once | ORAL | Status: AC
  Administered 2020-02-04: 7.5 mg via ORAL
  Filled 2020-02-04: qty 1

## 2020-02-04 NOTE — Progress Notes (Signed)
POD 6 Ray amputation. Vac removed on Friday as was not functioning. Patient was having dry dressing changes and doing ok. Unfortunatley bumped foot early this morning producing copious bleeding. Pressure dressing with several layers applied by nursing , last being about an hour ago. Has completley saturated dressing. Patient is on Heparin bridging. Pharmacy has held  For now  Saturated dressing removed. Bleeding has decreased .  Sutures remain in place Foot is quite swollen and some concern for Hemetoma formation given events. Will apply new dressing. Will defer Heparin management to Hospitalist but if can be held for 12 hours that would be ideal. Patient will elevate foot and remain in bed this morning ' Some concern if hemetoma may affect wound healing and increased risk for infection. Will discuss with Dr. [name] upon his return tomorrow

## 2020-02-04 NOTE — Progress Notes (Signed)
ANTICOAGULATION CONSULT NOTE - Follow Up Consult  Pharmacy Consult for Heparin/Coumadin Indication: recurrent PE  Allergies  Allergen Reactions  . Other Other (See Comments)    "Cactus" caused blisters on tongue (if prepared as food)    Patient Measurements: Height: 5\' 9"  (175.3 cm)(office visit 01/23/20) Weight: 195 lb 8.8 oz (88.7 kg) IBW/kg (Calculated) : 70.7 Heparin Dosing Weight:  88.7 kg  Vital Signs:    Labs: Recent Labs    02/02/20 0222 02/02/20 0222 02/03/20 0431 02/04/20 0526  HGB 9.6*   < > 9.4* 9.5*  HCT 29.7*  --  30.0* 29.8*  PLT 163  --  183 158  LABPROT 15.4*  --  16.0* 16.0*  INR 1.2  --  1.3* 1.3*  HEPARINUNFRC 0.58  --  0.56 0.44  CREATININE 0.69  --   --   --    < > = values in this interval not displayed.    Estimated Creatinine Clearance: 108.2 mL/min (by C-G formula based on SCr of 0.69 mg/dL).   Assessment:  Anticoag: Warfarin PTA for h/o recurrent PE (04/2016), s/p mitral valve repair (10/2016). Ortho 3rd ray amputation 3/24>>resume heparin/warfarin. Hl 0.44 in goal. INR 1.3. Hgb 9.5 stalbe. Plts 158 ok. - 3/29 PA noted bumped foot early this morning producing copious bleeding. Hep held per PA note. Concern for hematoma  Goal of Therapy:  Heparin level 0.3-0.7 units/ml  INR 2-3 Monitor platelets by anticoagulation protocol: Yes   Plan:  Heparin turned off somewhere around 0600-0630 per RN. F/u to restart later.  Warfarin 7.5 mg x1  Daily HL, CBC, and INR Magnesium 4 IV x 1 today.   Bruk Tumolo S. Alford Highland, PharmD, BCPS Clinical Staff Pharmacist [email or web address] Alford Highland, [employer] 02/04/2020,8:33 AM

## 2020-02-04 NOTE — Progress Notes (Signed)
PROGRESS NOTE  Leonard Bentley PFY:[id number] DOB: 08-03-1961   PCP: [name], NP  Patient is from: Home.  Lives alone.  Uses cane and walker since he was left foot surgery on 01/16/2020.  DOA: 01/27/2020 LOS: 8  Brief Narrative / Interim history: 59 year old male with history of left foot osteomyelitis status post first and second left ray amputation on 01/16/2020 by Dr. [name], combined CHF with last EF improved to 55/60%, mitral valve repair, history of PE on warfarin, stage II NHL in remission, EtOH abuse and tobacco abuse presenting with increased pain and drainage from left foot.  No fever or trauma involved.  Patient was discharged on 3/12.  Not on antibiotic.  In ED, HDS on arrival but became tachycardic to 120s.  100% on room air.  Afebrile.  WBC 3.2.  Anion gap 17.  Otherwise, CBC with differential and CMP without significant finding.  Lactic acid 4.9> 2.7.  HS Trop negative x2.  EKG NSR.  Influenza PCR and COVID-19 negative.  INR 3.8.  CXR without acute finding.  DG left foot with new displaced fracture through the third metatarsal head concerning for pathologic fracture secondary to underlying osteomyelitis, and nonspecific soft tissue swelling about the foot.  Patient received IV vitamin K 2.5 and empiric antibiotics and admitted.  Reportedly orthopedic surgery, Dr. [name] consulted.   Bilateral ABI without PAD.  Patient had left foot third ray surgery amputation, local tissue rearrangement for wound closure 5 x 10 cm and Prevena 13 cm wound VAC dressing by Dr. [name] on 3/24.  Antibiotics discontinued on 01/31/2020.   Subjective: Patient seen. Oozing from the surgical site (Patient bumped left foot)  Objective: Vitals:   02/02/20 2142 02/03/20 0310 02/03/20 1225 02/03/20 1325  BP: (!) 135/104 (!) 129/112  (!) 126/98  Pulse: 100 84  78  Resp:  15  16  Temp:  97.8 F (36.6 C) 98.4 F (36.9 C)   TempSrc:  Axillary Oral   SpO2:  96%  100%  Weight:      Height:         Intake/Output Summary (Last 24 hours) at 02/04/2020 0859 Last data filed at 02/04/2020 0831 Gross per 24 hour  Intake 480 ml  Output 350 ml  Net 130 ml   Filed Weights   01/31/20 0354 01/31/20 0748 02/01/20 0449  Weight: 88.5 kg 88.5 kg 88.7 kg    Examination: GENERAL: No acute distress.  Appears well.  HEENT: No pallor or jaundice. NECK: Supple.  No apparent JVD.  RESP: Decreased air entry. CVS: S1-S2. ABD/GI/GU: Bowel sounds present. Soft. Non tender.  MSK/EXT: Left foot wound dressing is soaked with blood.   NEURO: Awake, alert and oriented appropriately.  Patient moves all extremities  Procedures:  3/24-Patient had left foot third ray amputation, local tissue rearrangement for wound closure 5 x 10 cm and Prevena 13 cm wound VAC dressing by Dr. [name] on 3/24  Assessment & Plan: Osteomyelitis and pathologic fracture of left third metatarsal bone-CRP 1.2.  ESR within normal. History of first and second ray amputation of left foot by Dr. [name] on 3/10 Sepsis due to osteomyelitis-sepsis physiology resolved. -S/p third ray amputation and local tissue rearrangement for wound closure by Dr. [name] on 3/24 -COVID-19, influenza PCR and blood cultures negative -Lactic acidosis resolved.  Procalcitonin negative. -Vancomycin and ceftriaxone 3/21-3/25. -Strict nonweightbearing on the left.  Ortho to remove wound VAC. -PT/OT-SNF. 02/03/2020: Pursue disposition. 02/04/2020: Wound dressing is soaked with blood flow and trauma.  Management bleeding conservatively.  May have to hold heparin for a few hours.  Dark skin discoloration over right first and second toe: -Bilateral ABI without PAD.  No signs of infection. -Continue monitoring  Chronic diastolic CHF-previously with combined CHF but recovered LV function on echocardiogram in 08/2018.  No acute cardiopulmonary symptoms.  Does not seem to be on diuretics. -Monitor fluid status 02/03/2020: Stable.  History of PE on  warfarin/supratherapeutic INR-INR 3.8> IV vit K 2.5 mg> 2.6> 1.1> 1.2 -Warfarin with heparin bridge. 02/03/2020: INR is 1.3 today.  Mitra valve repair with annular ring prosthesis in 2017-has been on warfarin -Anticoagulation as above.  Tachycardia/essential hypertension/history of SVT/PAT/PACs-due to withdrawal?  Resolved. -Continue home metoprolol 02/03/2020: Tachycardia has resolved.  Blood pressure is reasonably controlled.  Continue to optimize.  Alcohol abuse-reports drinking a bottle of wine a [place].  Last drink was the Lemon prior to admission.  -Encouraged cessation -Continue CIWA with as needed Ativan -Continue vitamins 02/03/2020: Stable.  Tobacco use disorder: Reports smoking about a pack a [place]. -Encouraged cessation.  Discussed the importance of this for wound healing -Nicotine patch 02/03/2020: Stable  Lactic acidosis-likely due to alcohol versus sepsis.  Resolved.  Anion gap: Likely due to lactic acidosis and alcohol.  Resolved.  History of stage II NHL in remission- -outpatient follow-up.  Hypomagnesemia-likely due to alcohol.  Replenished.  Leukopenia-likely due to alcohol.   -Continue to monitor.  Normocytic anemia: Hgb 10.9, at baseline>> 10.5.  Anemia panel consistent with ACD -Monitor intermittently 02/03/2020: Hemoglobin today is 9.4 g/dL. 02/04/2019: Hemoglobin is stable.  Pressure Injury 01/28/20 Buttocks Right Stage 1 -  Intact skin with non-blanchable redness of a localized area usually over a bony prominence. (Active)  01/28/20 1629  Location: Buttocks  Location Orientation: Right  Staging: Stage 1 -  Intact skin with non-blanchable redness of a localized area usually over a bony prominence.  Wound Description (Comments):   Present on Admission: Yes     DVT prophylaxis: SCD Code Status: Full code Family Communication: Patient and/or RN. Available if any question.   Discharge barrier: Subtherapeutic INR and lack of insurance for SNF  placement Patient is from: Home Final disposition: SNF when INR is therapeutic and SNF bed available.   Consultants: Orthopedic surgery   Microbiology summarized: COVID-19 negative Influenza PCR negative Blood cultures negative  Sch Meds:  Scheduled Meds: . docusate sodium  100 mg Oral BID  . folic acid  1 mg Oral Daily  . metoprolol tartrate  25 mg Oral BID  . multivitamin with minerals  1 tablet Oral Daily  . nicotine  14 mg Transdermal Daily  . thiamine  100 mg Oral Daily   Or  . thiamine  100 mg Intravenous Daily  . warfarin  7.5 mg Oral ONCE-1600  . Warfarin - Pharmacist Dosing Inpatient   Does not apply q1800   Continuous Infusions: . lactated ringers 10 mL/hr at 02/02/20 0359  . magnesium sulfate bolus IVPB     PRN Meds:.acetaminophen **OR** acetaminophen, metoprolol tartrate, morphine injection, ondansetron **OR** ondansetron (ZOFRAN) IV, oxyCODONE  Antimicrobials: Anti-infectives (From admission, onward)   Start     Dose/Rate Route Frequency Ordered Stop   01/30/20 0830  ceFAZolin (ANCEF) IVPB 2g/100 mL premix  Status:  Discontinued     2 g 200 mL/hr over 30 Minutes Intravenous To Short Stay 01/30/20 0824 01/30/20 1330   01/28/20 1800  cefTRIAXone (ROCEPHIN) 2 g in sodium chloride 0.9 % 100 mL IVPB     2 g  200 mL/hr over 30 Minutes Intravenous Every 24 hours 01/27/20 2211 01/30/20 1900   01/28/20 0600  vancomycin (VANCOCIN) IVPB 1000 mg/200 mL premix     1,000 mg 200 mL/hr over 60 Minutes Intravenous Every 12 hours 01/27/20 1738 01/31/20 1006   01/27/20 1730  vancomycin (VANCOCIN) IVPB 1000 mg/200 mL premix  Status:  Discontinued     1,000 mg 200 mL/hr over 60 Minutes Intravenous  Once 01/27/20 1725 01/27/20 1726   01/27/20 1730  cefTRIAXone (ROCEPHIN) 2 g in sodium chloride 0.9 % 100 mL IVPB     2 g 200 mL/hr over 30 Minutes Intravenous  Once 01/27/20 1725 01/27/20 2010   01/27/20 1730  vancomycin (VANCOREADY) IVPB 2000 mg/400 mL     2,000 mg 200 mL/hr  over 120 Minutes Intravenous  Once 01/27/20 1726 01/27/20 2219       I have personally reviewed the following labs and images: CBC: Recent Labs  Lab 01/31/20 0258 01/31/20 2335 02/02/20 0222 02/03/20 0431 02/04/20 0526  WBC 5.6 4.5 4.3 3.5* 3.0*  HGB 10.5* 10.6* 9.6* 9.4* 9.5*  HCT 32.2* 32.7* 29.7* 30.0* 29.8*  MCV 82.6 82.4 83.9 84.0 83.9  PLT 185 174 163 183 158   BMP &GFR Recent Labs  Lab 01/29/20 0057 01/29/20 0057 01/30/20 0234 01/31/20 0258 01/31/20 2335 02/02/20 0222 02/04/20 0220  NA 135  --  137 135 138 138  --   K 4.4  --  4.2 4.1 3.9 3.7  --   CL 100  --  101 102 104 105  --   CO2 27  --  _0 --   GLUCOSE 126*  --  123* 117* 107* 112*  --   BUN 7  --  _1 --   CREATININE 0.87  --  0.86 0.82 0.83 0.69  --   CALCIUM 8.7*  --  8.7* 8.7* 8.7* 8.7*  --   MG 1.5*   < > 1.9 1.6* 1.6* 1.5* 1.5*  PHOS 3.4  --  3.0 2.8 3.9 3.5  --    < > = values in this interval not displayed.   Estimated Creatinine Clearance: 108.2 mL/min (by C-G formula based on SCr of 0.69 mg/dL). Liver & Pancreas: Recent Labs  Lab 01/29/20 0057 01/30/20 0234 01/31/20 0258 01/31/20 2335 02/02/20 0222  ALBUMIN 2.9* 3.0* 2.8* 2.9* 2.7*   No results for input(s): LIPASE, AMYLASE in the last 168 hours. No results for input(s): AMMONIA in the last 168 hours. Diabetic: No results for input(s): HGBA1C in the last 72 hours. Recent Labs  Lab 01/28/20 2121  GLUCAP 156*   Cardiac Enzymes: No results for input(s): CKTOTAL, CKMB, CKMBINDEX, TROPONINI in the last 168 hours. No results for input(s): PROBNP in the last 8760 hours. Coagulation Profile: Recent Labs  Lab 01/30/20 0836 01/31/20 2335 02/02/20 0222 02/03/20 0431 02/04/20 0526  INR 1.1 1.2 1.2 1.3* 1.3*   Thyroid Function Tests: No results for input(s): TSH, T4TOTAL, FREET4, T3FREE, THYROIDAB in the last 72 hours. Lipid Profile: No results for input(s): CHOL, HDL, LDLCALC, TRIG, CHOLHDL, LDLDIRECT in the  last 72 hours. Anemia Panel: No results for input(s): VITAMINB12, FOLATE, FERRITIN, TIBC, IRON, RETICCTPCT in the last 72 hours. Urine analysis:    Component Value Date/Time   COLORURINE YELLOW 01/27/2020 1841   APPEARANCEUR CLEAR 01/27/2020 1841   APPEARANCEUR Clear 08/03/2019 1143   LABSPEC 1.015 01/27/2020 1841   PHURINE 6.0 01/27/2020 1841   GLUCOSEU NEGATIVE 01/27/2020  [place] (A) 01/27/2020 1841   BILIRUBINUR NEGATIVE 01/27/2020 1841   BILIRUBINUR small (A) 08/03/2019 1144   BILIRUBINUR Negative 08/03/2019 1143   KETONESUR 5 (A) 01/27/2020 1841   PROTEINUR 30 (A) 01/27/2020 1841   UROBILINOGEN 1.0 08/03/2019 1144   UROBILINOGEN 1.0 01/25/2013 0825   NITRITE NEGATIVE 01/27/2020 1841   LEUKOCYTESUR NEGATIVE 01/27/2020 1841   Sepsis Labs: Invalid input(s): PROCALCITONIN, [place]  Microbiology: Recent Results (from the past 240 hour(s))  Blood culture (routine x 2)     Status: None   Collection Time: 01/27/20  6:07 PM   Specimen: BLOOD RIGHT ARM  Result Value Ref Range Status   Specimen Description BLOOD RIGHT ARM  Final   Special Requests   Final    BOTTLES DRAWN AEROBIC AND ANAEROBIC Blood Culture results may not be optimal due to an excessive volume of blood received in culture bottles   Culture   Final    NO GROWTH 5 DAYS Performed at [facility], [place] [address] [place], [place] [address]    Report Status 02/01/2020 FINAL  Final  Blood culture (routine x 2)     Status: None   Collection Time: 01/27/20  6:07 PM   Specimen: BLOOD LEFT HAND  Result Value Ref Range Status   Specimen Description BLOOD LEFT HAND  Final   Special Requests   Final    BOTTLES DRAWN AEROBIC AND ANAEROBIC Blood Culture results may not be optimal due to an excessive volume of blood received in culture bottles   Culture   Final    NO GROWTH 5 DAYS Performed at [facility], [place] [address] [place], [place] [address]    Report Status 02/01/2020 FINAL  Final   Respiratory Panel by RT PCR (Flu A&B, Covid) - Nasopharyngeal Swab     Status: None   Collection Time: 01/27/20  8:09 PM   Specimen: Nasopharyngeal Swab  Result Value Ref Range Status   SARS Coronavirus 2 by RT PCR NEGATIVE NEGATIVE Final    Comment: (NOTE) SARS-CoV-2 target nucleic acids are NOT DETECTED. The SARS-CoV-2 RNA is generally detectable in upper respiratoy specimens during the acute phase of infection. The lowest concentration of SARS-CoV-2 viral copies this assay can detect is 131 copies/mL. A negative result does not preclude SARS-Cov-2 infection and should not be used as the sole basis for treatment or other patient management decisions. A negative result may occur with  improper specimen collection/handling, submission of specimen other than nasopharyngeal swab, presence of viral mutation(s) within the areas targeted by this assay, and inadequate number of viral copies (<131 copies/mL). A negative result must be combined with clinical observations, patient history, and epidemiological information. The expected result is Negative. Fact Sheet for Patients:  PinkCheek.be Fact Sheet for Healthcare Providers:  GravelBags.it This test is not yet ap proved or cleared by the [place] FDA and  has been authorized for detection and/or diagnosis of SARS-CoV-2 by FDA under an Emergency Use Authorization (EUA). This EUA will remain  in effect (meaning this test can be used) for the duration of the COVID-19 declaration under Section 564(b)(1) of the Act, 21 U.S.C. section 360bbb-3(b)(1), unless the authorization is terminated or revoked sooner.    Influenza A by PCR NEGATIVE NEGATIVE Final   Influenza B by PCR NEGATIVE NEGATIVE Final    Comment: (NOTE) The Xpert Xpress SARS-CoV-2/FLU/RSV assay is intended as an aid in  the diagnosis of influenza from Nasopharyngeal swab specimens and  should not be used as a sole  basis for treatment. Nasal washings and  aspirates are unacceptable for Xpert Xpress SARS-CoV-2/FLU/RSV  testing. Fact Sheet for Patients: PinkCheek.be Fact Sheet for Healthcare Providers: GravelBags.it This test is not yet approved or cleared by the [place] FDA and  has been authorized for detection and/or diagnosis of SARS-CoV-2 by  FDA under an Emergency Use Authorization (EUA). This EUA will remain  in effect (meaning this test can be used) for the duration of the  Covid-19 declaration under Section 564(b)(1) of the Act, 21  U.S.C. section 360bbb-3(b)(1), unless the authorization is  terminated or revoked. Performed at [facility], [place] [address], [place], [place] [address]     Radiology Studies: No results found.   Bonnell Public, MD. [facility]  If 7PM-7AM, please contact night-coverage [email or web address] Password [facility] 02/04/2020, 8:59 AM

## 2020-02-04 NOTE — Progress Notes (Signed)
Pt. Refused dressing change times 2 last night stated he wanted to wait until [place] shift. Pt now upset that dressing is bleeding. Explain to pt that is why I tried to change it twice last night and he refused. Pt stated he only refused because he wanted it done during the [place]. Will change pt dressing now

## 2020-02-04 NOTE — TOC Progression Note (Addendum)
Transition of Care [facility]) - Progression Note    Patient Details  Name: Leonard Bentley MRN: [id number] Date of Birth: 01-23-1961  Transition of Care [facility]) CM/SW [place], LCSW Phone Number: 02/04/2020, 10:47 AM  Clinical Narrative:    CSW continuing to follow. No SNF bed offers available for patient. CSW spoke with patient and updated him. He requested contact info for filing unemployment, which CSW provided.      Barriers to Discharge: Continued Medical Work up, SNF Pending bed offer, SNF Pending payor source - LOG  Expected Discharge Plan and Services   In-house Referral: Clinical Social Work, Risk manager Acute Care Choice: [place] arrangements for the past 2 months: [place] Determinants of Health (SDOH) Interventions    Readmission Risk Interventions Readmission Risk Prevention Plan 01/31/2020 01/16/2020 01/18/2019  Transportation Screening Complete Complete Complete  PCP or Specialist Appt within 5-7 Days Complete Complete -  Home Care Screening Complete Complete -  Medication Review (RN CM) Complete Complete -  Medication Review ([place]) - - Complete  HRI or [place] - - Complete  [place] - - Not Applicable  Some recent data might be hidden

## 2020-02-05 DIAGNOSIS — L89896 Pressure-induced deep tissue damage of other site: Secondary | ICD-10-CM

## 2020-02-05 LAB — CBC
HCT: 26.4 % — ABNORMAL LOW (ref 39.0–52.0)
Hemoglobin: 8.4 g/dL — ABNORMAL LOW (ref 13.0–17.0)
MCH: 26.5 pg (ref 26.0–34.0)
MCHC: 31.8 g/dL (ref 30.0–36.0)
MCV: 83.3 fL (ref 80.0–100.0)
Platelets: 177 10*3/uL (ref 150–400)
RBC: 3.17 MIL/uL — ABNORMAL LOW (ref 4.22–5.81)
RDW: 15.3 % (ref 11.5–15.5)
WBC: 3.1 10*3/uL — ABNORMAL LOW (ref 4.0–10.5)
nRBC: 0 % (ref 0.0–0.2)

## 2020-02-05 LAB — PROTIME-INR
INR: 1.4 — ABNORMAL HIGH (ref 0.8–1.2)
Prothrombin Time: 16.9 seconds — ABNORMAL HIGH (ref 11.4–15.2)

## 2020-02-05 LAB — HEMOGLOBIN A1C
Hgb A1c MFr Bld: 5.8 % — ABNORMAL HIGH (ref 4.8–5.6)
Mean Plasma Glucose: 119.76 mg/dL

## 2020-02-05 LAB — LIPID PANEL
Cholesterol: 164 mg/dL (ref 0–200)
HDL: 50 mg/dL (ref >40)
LDL Cholesterol: 100 mg/dL — ABNORMAL HIGH (ref 0–99)
Total CHOL/HDL Ratio: 3.3 RATIO
Triglycerides: 71 mg/dL (ref <150)
VLDL: 14 mg/dL (ref 0–40)

## 2020-02-05 LAB — HEPARIN LEVEL (UNFRACTIONATED): Heparin Unfractionated: 0.28 IU/mL — ABNORMAL LOW (ref 0.30–0.70)

## 2020-02-05 MED ORDER — WARFARIN SODIUM 7.5 MG PO TABS
7.5000 mg | ORAL_TABLET | Freq: Once | ORAL | Status: AC
  Administered 2020-02-05: 7.5 mg via ORAL
  Filled 2020-02-05: qty 1

## 2020-02-05 NOTE — Plan of Care (Signed)
  Problem: Education: Goal: Knowledge of General Education information will improve Description: Including pain rating scale, medication(s)/side effects and non-pharmacologic comfort measures Outcome: Progressing  Patient asking questions, teaching back info related to bleeding risks and how excessive bleeding affects wound healing.  Problem: Health Behavior/Discharge Planning: Goal: Ability to manage health-related needs will improve Outcome: Progressing  Patient able to plan hygiene/ADL's so he doesn't bear weight on LLE with staff assistance.  Problem: Clinical Measurements: Goal: Will remain free from infection Outcome: Progressing  Pt afebrile this morning.

## 2020-02-05 NOTE — Progress Notes (Signed)
PROGRESS NOTE    Leonard Bentley  [id number] DOB: 21-Jun-1961 DOA: 01/27/2020 PCP: [name], NP   Brief Narrative:  59 year old with history of left foot osteomyelitis status post first/second left ray amputation 3/10, CHF EF 55%, mitral valve repair, PE on Coumadin, stage II NHL in remission, tobacco and alcohol use admitted for increasing pain and drainage from the left foot.  Diagnosed with newly displaced fracture of the third metatarsal with underlying osteomyelitis.  ABI negative for PAD, orthopedic consulted and patient underwent left foot third ray amputation with debridement on 3/24, antibiotics stopped on 3/25.   Assessment & Plan:   Principal Problem:   Sepsis ([place]) Active Problems:   Alcohol abuse   History of pulmonary embolus (PE)   S/P minimally-invasive mitral valve repair   Subacute osteomyelitis, left ankle and foot (HCC)   Pressure injury of skin   Wound dehiscence, surgical, initial encounter  Left third metatarsal fracture with osteomyelitis status post amputation 3/24 Peripheral arterial disease? -Postoperative management per orthopedic.  Nonweightbearing.  Antibiotics stopped on 3/25. -ABI-no evidence of PAD -Unsure he has some microcirculatory issues, will check A1c and lipid panel.  Chronic diastolic congestive heart failure, EF 55% -Stable.  History of pulmonary embolism -Continue Coumadin.  Heparin to Coumadin bridge.  Pharmacy to manage.  History of mitral valve repair with annular ring prosthesis 2017 -On Coumadin.  History of SVT/PACs -On metoprolol.  History of tobacco use -Nicotine patch  Non-Hodgkin's lymphoma in remission, stage II -Follow-up outpatient.  DVT prophylaxis: Heparin to Coumadin bridge Code Status: Full code Family Communication: None Disposition Plan:   Patient From= home  Patient Anticipated D/C place= SNF  Barriers= currently pending bed offers at skilled nursing facility.  Maintain hospital stay as patient  requires skilled nursing assistance for his lower extremity amputation and wounds.   Subjective: No complaints had some traumatic bleeding from his surgical site yesterday evening after he banged his foot on the floor but this morning it has subsided.  Review of Systems Otherwise negative except as per HPI, including: General: Denies fever, chills, night sweats or unintended weight loss. Resp: Denies cough, wheezing, shortness of breath. Cardiac: Denies chest pain, palpitations, orthopnea, paroxysmal nocturnal dyspnea. GI: Denies abdominal pain, nausea, vomiting, diarrhea or constipation GU: Denies dysuria, frequency, hesitancy or incontinence MS: Denies muscle aches, joint pain or swelling Neuro: Denies headache, neurologic deficits (focal weakness, numbness, tingling), abnormal gait Psych: Denies anxiety, depression, SI/HI/AVH Skin: Denies new rashes or lesions ID: Denies sick contacts, exotic exposures, travel  Examination:  General exam: Appears calm and comfortable  Respiratory system: Clear to auscultation. Respiratory effort normal. Cardiovascular system: S1 & S2 heard, RRR. No JVD, murmurs, rubs, gallops or clicks. No pedal edema. Gastrointestinal system: Abdomen is nondistended, soft and nontender. No organomegaly or masses felt. Normal bowel sounds heard. Central nervous system: Alert and oriented. No focal neurological deficits. Extremities: Symmetric 5 x 5 power. Skin: Left lower extremity surgical dressing has been noted.  Right toe dark area noted. Psychiatry: Judgement and insight appear normal. Mood & affect appropriate.     Objective: Vitals:   02/04/20 1644 02/04/20 2204 02/04/20 2221 02/05/20 0533  BP: (!) 122/93 (!) 118/92 119/88 (!) 119/96  Pulse: 68 72 85 87  Resp: 18     Temp: 98.2 F (36.8 C)  98.2 F (36.8 C) 99 F (37.2 C)  TempSrc: Oral     SpO2: 99%  98% 97%  Weight:      Height:  Intake/Output Summary (Last 24 hours) at 02/05/2020  0850 Last data filed at 02/04/2020 1900 Gross per 24 hour  Intake 283.38 ml  Output 201 ml  Net 82.38 ml   Filed Weights   01/31/20 0354 01/31/20 0748 02/01/20 0449  Weight: 88.5 kg 88.5 kg 88.7 kg     Data Reviewed:   CBC: Recent Labs  Lab 01/31/20 2335 02/02/20 0222 02/03/20 0431 02/04/20 0526 02/05/20 0347  WBC 4.5 4.3 3.5* 3.0* 3.1*  HGB 10.6* 9.6* 9.4* 9.5* 8.4*  HCT 32.7* 29.7* 30.0* 29.8* 26.4*  MCV 82.4 83.9 84.0 83.9 83.3  PLT 174 163 183 158 [id number]   Basic Metabolic Panel: Recent Labs  Lab 01/30/20 0234 01/31/20 0258 01/31/20 2335 02/02/20 0222 02/04/20 0220  NA 137 135 138 138  --   K 4.2 4.1 3.9 3.7  --   CL 101 102 104 105  --   CO2 27 24 25 25   --   GLUCOSE 123* 117* 107* 112*  --   BUN 9 9 10 8   --   CREATININE 0.86 0.82 0.83 0.69  --   CALCIUM 8.7* 8.7* 8.7* 8.7*  --   MG 1.9 1.6* 1.6* 1.5* 1.5*  PHOS 3.0 2.8 3.9 3.5  --    GFR: Estimated Creatinine Clearance: 108.2 mL/min (by C-G formula based on SCr of 0.69 mg/dL). Liver Function Tests: Recent Labs  Lab 01/30/20 0234 01/31/20 0258 01/31/20 2335 02/02/20 0222  ALBUMIN 3.0* 2.8* 2.9* 2.7*   No results for input(s): LIPASE, AMYLASE in the last 168 hours. No results for input(s): AMMONIA in the last 168 hours. Coagulation Profile: Recent Labs  Lab 01/31/20 2335 02/02/20 0222 02/03/20 0431 02/04/20 0526 02/05/20 0347  INR 1.2 1.2 1.3* 1.3* 1.4*   Cardiac Enzymes: No results for input(s): CKTOTAL, CKMB, CKMBINDEX, TROPONINI in the last 168 hours. BNP (last 3 results) No results for input(s): PROBNP in the last 8760 hours. HbA1C: No results for input(s): HGBA1C in the last 72 hours. CBG: No results for input(s): GLUCAP in the last 168 hours. Lipid Profile: No results for input(s): CHOL, HDL, LDLCALC, TRIG, CHOLHDL, LDLDIRECT in the last 72 hours. Thyroid Function Tests: No results for input(s): TSH, T4TOTAL, FREET4, T3FREE, THYROIDAB in the last 72 hours. Anemia Panel: No  results for input(s): VITAMINB12, FOLATE, FERRITIN, TIBC, IRON, RETICCTPCT in the last 72 hours. Sepsis Labs: No results for input(s): PROCALCITON, LATICACIDVEN in the last 168 hours.  Recent Results (from the past 240 hour(s))  Blood culture (routine x 2)     Status: None   Collection Time: 01/27/20  6:07 PM   Specimen: BLOOD RIGHT ARM  Result Value Ref Range Status   Specimen Description BLOOD RIGHT ARM  Final   Special Requests   Final    BOTTLES DRAWN AEROBIC AND ANAEROBIC Blood Culture results may not be optimal due to an excessive volume of blood received in culture bottles   Culture   Final    NO GROWTH 5 DAYS Performed at [facility], [place] [address] [place], [place] [address]    Report Status 02/01/2020 FINAL  Final  Blood culture (routine x 2)     Status: None   Collection Time: 01/27/20  6:07 PM   Specimen: BLOOD LEFT HAND  Result Value Ref Range Status   Specimen Description BLOOD LEFT HAND  Final   Special Requests   Final    BOTTLES DRAWN AEROBIC AND ANAEROBIC Blood Culture results may not be optimal due to  an excessive volume of blood received in culture bottles   Culture   Final    NO GROWTH 5 DAYS Performed at [facility], [place] [address] [place], [place] [address]    Report Status 02/01/2020 FINAL  Final  Respiratory Panel by RT PCR (Flu A&B, Covid) - Nasopharyngeal Swab     Status: None   Collection Time: 01/27/20  8:09 PM   Specimen: Nasopharyngeal Swab  Result Value Ref Range Status   SARS Coronavirus 2 by RT PCR NEGATIVE NEGATIVE Final    Comment: (NOTE) SARS-CoV-2 target nucleic acids are NOT DETECTED. The SARS-CoV-2 RNA is generally detectable in upper respiratoy specimens during the acute phase of infection. The lowest concentration of SARS-CoV-2 viral copies this assay can detect is 131 copies/mL. A negative result does not preclude SARS-Cov-2 infection and should not be used as the sole basis for treatment or other patient management  decisions. A negative result may occur with  improper specimen collection/handling, submission of specimen other than nasopharyngeal swab, presence of viral mutation(s) within the areas targeted by this assay, and inadequate number of viral copies (<131 copies/mL). A negative result must be combined with clinical observations, patient history, and epidemiological information. The expected result is Negative. Fact Sheet for Patients:  PinkCheek.be Fact Sheet for Healthcare Providers:  GravelBags.it This test is not yet ap proved or cleared by the [place] FDA and  has been authorized for detection and/or diagnosis of SARS-CoV-2 by FDA under an Emergency Use Authorization (EUA). This EUA will remain  in effect (meaning this test can be used) for the duration of the COVID-19 declaration under Section 564(b)(1) of the Act, 21 U.S.C. section 360bbb-3(b)(1), unless the authorization is terminated or revoked sooner.    Influenza A by PCR NEGATIVE NEGATIVE Final   Influenza B by PCR NEGATIVE NEGATIVE Final    Comment: (NOTE) The Xpert Xpress SARS-CoV-2/FLU/RSV assay is intended as an aid in  the diagnosis of influenza from Nasopharyngeal swab specimens and  should not be used as a sole basis for treatment. Nasal washings and  aspirates are unacceptable for Xpert Xpress SARS-CoV-2/FLU/RSV  testing. Fact Sheet for Patients: PinkCheek.be Fact Sheet for Healthcare Providers: GravelBags.it This test is not yet approved or cleared by the [place] FDA and  has been authorized for detection and/or diagnosis of SARS-CoV-2 by  FDA under an Emergency Use Authorization (EUA). This EUA will remain  in effect (meaning this test can be used) for the duration of the  Covid-19 declaration under Section 564(b)(1) of the Act, 21  U.S.C. section 360bbb-3(b)(1), unless the authorization  is  terminated or revoked. Performed at [facility], [place] [address], [place], [place] [address]          Radiology Studies: No results found.      Scheduled Meds: . docusate sodium  100 mg Oral BID  . folic acid  1 mg Oral Daily  . metoprolol tartrate  25 mg Oral BID  . multivitamin with minerals  1 tablet Oral Daily  . nicotine  14 mg Transdermal Daily  . thiamine  100 mg Oral Daily   Or  . thiamine  100 mg Intravenous Daily  . Warfarin - Pharmacist Dosing Inpatient   Does not apply q1800   Continuous Infusions: . heparin 1,300 Units/hr (02/04/20 1900)  . lactated ringers 10 mL/hr at 02/04/20 1304     LOS: 9 days   Time spent= 35 mins    Kaiulani Sitton Chirag Jadis Pitter,  MD [facility]  If 7PM-7AM, please contact night-coverage  02/05/2020, 8:50 AM

## 2020-02-05 NOTE — Progress Notes (Signed)
ANTICOAGULATION CONSULT NOTE - Follow Up Consult  Pharmacy Consult for Heparin/Coumadin Indication: recurrent PE  Allergies  Allergen Reactions  . Other Other (See Comments)    "Cactus" caused blisters on tongue (if prepared as food)    Patient Measurements: Height: 5\' 9"  (175.3 cm)(office visit 01/23/20) Weight: 195 lb 8.8 oz (88.7 kg) IBW/kg (Calculated) : 70.7 Heparin Dosing Weight:  88.7 kg  Vital Signs: Temp: 97.9 F (36.6 C) (03/30 0853) Temp Source: Oral (03/30 0853) BP: 136/101 (03/30 0853) Pulse Rate: 70 (03/30 0853)  Labs: Recent Labs    02/03/20 0431 02/03/20 0431 02/04/20 0526 02/05/20 0347  HGB 9.4*   < > 9.5* 8.4*  HCT 30.0*  --  29.8* 26.4*  PLT 183  --  158 177  LABPROT 16.0*  --  16.0* 16.9*  INR 1.3*  --  1.3* 1.4*  HEPARINUNFRC 0.56  --  0.44 0.28*   < > = values in this interval not displayed.    Estimated Creatinine Clearance: 108.2 mL/min (by C-G formula based on SCr of 0.69 mg/dL).   Assessment:  Anticoag: Warfarin PTA for h/o recurrent PE (04/2016), s/p mitral valve repair (10/2016). Ortho 3rd ray amputation 3/24>>resume heparin/warfarin. HL 0.28 slightly <goal. INR 1.4. Hgb 9.5>8.4 s/p bleeding 3/29.Marland Kitchen Plts 177 ok. No further foot bleeding noted. - 3/29 PA noted bumped foot early this morning producing copious bleeding. Hep held per PA note. Concern for hematoma. Hep resumed in the afternoon.  - PTA warfarin dose 5mg  daily. Per ED provider patient also states he takes xarelto?? Looks like taking lovenox PTA as well to end on 3/21. Admit INR 3.8 (3/21) - S/p Vit K 2.5 mg IV 3/22   Goal of Therapy:  Heparin level 0.3-0.7 units/ml  INR 2-3 Monitor platelets by anticoagulation protocol: Yes   Plan:  Heparin incr slightly to 1350 units/hr Warfarin 7.5 mg x1  Daily HL, CBC, and INR Recheck Mg tomorrow   Samyah Bilbo S. [place] Highland, PharmD, BCPS Clinical Staff Pharmacist [email or web address] Alford Highland, [employer] 02/05/2020,8:54 AM

## 2020-02-05 NOTE — Progress Notes (Signed)
Physical Therapy Treatment Patient Details Name: Leonard Bentley MRN: [id number] DOB: 07/16/1961 Today's Date: 02/05/2020    History of Present Illness 59 y.o. male with what a recent left foot first and second ray amputation for osteomyelitis and discharged on March 10/27/2020 with history of minimally invasive mitral valve repair, chronic combined systolic and diastolic CHF last EF is improved to 55 to 60%, alcohol abuse, non-Hodgkin's lymphoma stage II in remission presents to the ER with complaint of increasing pain and discharge from the left foot. following L foot first and 2nd ray amputation.   X-rays show fracture involving the left third metatarsal concerning for osteomyelitis. Admitted 01/27/20 for amputation revision sx and s/p 3rd ray amputation on 01/30/20.    PT Comments    On entry pt preparing to get to bathroom with nursing. Increased concern for safety of transfers due to pt "stubbing foot on floor" yesterday resulting in opening of wound and excessive bleeding. Pt is mod I for coming to EoB, min guard from transfer to RW and min A for steadying with hop to ambulation into bathroom. Extensive education and multimodal cuing required for insuring safety with movement. Pt with decreased safety awareness and decreased knowledge of safe RW usage. Pt minA for safe power up from low toilet and min A for ambulation to sink where NT set him up for bathing. D/c plans remain appropriate at this time. PT will continue to follow acutely.   Follow Up Recommendations  SNF     Equipment Recommendations  Wheelchair cushion (measurements PT);Wheelchair (measurements PT)    Recommendations for Other Services       Precautions / Restrictions Precautions Precautions: Fall Precaution Comments: wound VAC L foot, watch HR Required Braces or Orthoses: Other Brace Other Brace: post-op shoe Restrictions Weight Bearing Restrictions: Yes LLE Weight Bearing: Non weight bearing    Mobility  Bed  Mobility Overal bed mobility: Needs Assistance Bed Mobility: Supine to Sit;Sit to Supine     Supine to sit: Supervision     General bed mobility comments: cues for line/lead management  Transfers Overall transfer level: Needs assistance Equipment used: Rolling walker (2 wheeled) Transfers: Sit to/from Stand Sit to Stand: Min assist;Min guard         General transfer comment: min guard for powerup from bed, minA for powerup and maintainence of L foot off floor from low toilet seat  Ambulation/Gait Ambulation/Gait assistance: Min assist Gait Distance (Feet): 12 Feet Assistive device: Rolling walker (2 wheeled) Gait Pattern/deviations: Step-to pattern Gait velocity: decr Gait velocity interpretation: <1.8 ft/sec, indicate of risk for recurrent falls General Gait Details: requires maximal multimodal cuing for safety, and maintainence of NWB, pt attempts to reach away from RW to sink and door when trying to enter bathroom, have to physically stop pt from performing unsafe hand placement.       Balance Overall balance assessment: Needs assistance Sitting-balance support: Feet supported Sitting balance-Leahy Scale: Good     Standing balance support: Bilateral upper extremity supported;During functional activity Standing balance-Leahy Scale: Poor Standing balance comment: required use of at least 1 UE                            Cognition Arousal/Alertness: Awake/alert Behavior During Therapy: WFL for tasks assessed/performed Overall Cognitive Status: No family/caregiver present to determine baseline cognitive functioning                     Current Attention  Level: Selective   Following Commands: Follows one step commands inconsistently;Follows multi-step commands inconsistently Safety/Judgement: Decreased awareness of safety;Decreased awareness of deficits   Problem Solving: Slow processing;Decreased initiation;Difficulty sequencing;Requires verbal  cues;Requires tactile cues General Comments: pt continues to have decreased awareness of the gravity of his situation, making excuses for the reasons that his foot hits the ground. Continued education       Exercises      General Comments General comments (skin integrity, edema, etc.): Ace wrap over surgical site, clean dry and intact, however RN reports yesterday pt stubbed his foot on the ground causing excessive bleeding at the surgical site.       Pertinent Vitals/Pain  No denies pain           PT Goals (current goals can now be found in the care plan section) Acute Rehab PT Goals PT Goal Formulation: With patient Time For Goal Achievement: 02/12/20 Potential to Achieve Goals: Fair Progress towards PT goals: Progressing toward goals    Frequency    Min 3X/week      PT Plan Current plan remains appropriate    Co-evaluation              AM-PAC PT "6 Clicks" Mobility   Outcome Measure  Help needed turning from your back to your side while in a flat bed without using bedrails?: None Help needed moving from lying on your back to sitting on the side of a flat bed without using bedrails?: None Help needed moving to and from a bed to a chair (including a wheelchair)?: A Little Help needed standing up from a chair using your arms (e.g., wheelchair or bedside chair)?: A Little Help needed to walk in hospital room?: A Little Help needed climbing 3-5 steps with a railing? : Total 6 Click Score: 18    End of Session Equipment Utilized During Treatment: Gait belt Activity Tolerance: Patient tolerated treatment well;Patient limited by pain Patient left: with call bell/phone within reach;Other (comment)(set up at sink for wash up. ) Nurse Communication: Mobility status PT Visit Diagnosis: Unsteadiness on feet (R26.81);Other abnormalities of gait and mobility (R26.89);Muscle weakness (generalized) (M62.81);Difficulty in walking, not elsewhere classified (R26.2);Pain Pain  - Right/Left: Left Pain - part of body: Ankle and joints of foot     Time: [id number] PT Time Calculation (min) (ACUTE ONLY): 22 min  Charges:  $Gait Training: 8-22 mins                     Loyed Wilmes B. Migdalia Dk PT, DPT Acute Rehabilitation Services Pager [phone] Office [phone]    [place] 02/05/2020, 11:27 AM

## 2020-02-05 NOTE — Progress Notes (Signed)
Physical Therapy Progress Note  PT Comments: Pt finished with bathing and RN asked for assist with safe ambulation back to recliner. Pt requires min A for transfer to RW and min A for steadying with hop to ambulation to chair. Once in chair, pt inquires what kind of exercises he can do to keep in strength. Pt educated in LE therapeutic exercise of L hip, knee and ankle to maintain strength for when he is weightbearing in the future.    02/05/20 1100  PT Visit Information  Last PT Received On 02/05/20  Assistance Needed +1  History of Present Illness 59 y.o. male with what a recent left foot first and second ray amputation for osteomyelitis and discharged on March 10/27/2020 with history of minimally invasive mitral valve repair, chronic combined systolic and diastolic CHF last EF is improved to 55 to 60%, alcohol abuse, non-Hodgkin's lymphoma stage II in remission presents to the ER with complaint of increasing pain and discharge from the left foot. following L foot first and 2nd ray amputation.   X-rays show fracture involving the left third metatarsal concerning for osteomyelitis. Admitted 01/27/20 for amputation revision sx and s/p 3rd ray amputation on 01/30/20.  Precautions  Precautions Fall  Precaution Comments wound VAC L foot, watch HR  Required Braces or Orthoses Other Brace  Other Brace post-op shoe  Restrictions  LLE Weight Bearing NWB  Cognition  Arousal/Alertness Awake/alert  Behavior During Therapy WFL for tasks assessed/performed  Overall Cognitive Status No family/caregiver present to determine baseline cognitive functioning  Current Attention Level Selective  Following Commands Follows one step commands inconsistently;Follows multi-step commands inconsistently  Safety/Judgement Decreased awareness of safety;Decreased awareness of deficits  Problem Solving Slow processing;Decreased initiation;Difficulty sequencing;Requires verbal cues;Requires tactile cues  General Comments pt  continues to have decreased awareness of the gravity of his situation, making excuses for the reasons that his foot hits the ground. Continued education   Transfers  Overall transfer level Needs assistance  Equipment used Rolling walker (2 wheeled)  Transfers Sit to/from Stand  Sit to Stand Min assist  General transfer comment min A for steadying with powerup to sink from chair and then reaching over to RW  Ambulation/Gait  Ambulation/Gait assistance Min assist  Gait Distance (Feet) 15 Feet  Assistive device Rolling walker (2 wheeled)  Gait Pattern/deviations Step-to pattern  General Gait Details requires maximal multimodal cuing for safety, and maintainence of NWB, pt attempts to reach away from RW to sink and door when trying to enter bathroom, have to physically stop pt from performing unsafe hand placement.  Gait velocity decr  Gait velocity interpretation <1.8 ft/sec, indicate of risk for recurrent falls  Balance  Overall balance assessment Needs assistance  Sitting-balance support Feet supported  Sitting balance-Leahy Scale Good  Standing balance support Bilateral upper extremity supported;During functional activity  Standing balance-Leahy Scale Poor  Standing balance comment required use of at least 1 UE  Exercises  Exercises General Lower Extremity  General Exercises - Lower Extremity  Ankle Circles/Pumps AROM;Both;10 reps  Heel Slides AROM;Left;Seated  Hip ABduction/ADduction AROM;Left;15 reps;Seated  Straight Leg Raises AROM;10 reps;Seated  PT - End of Session  Equipment Utilized During Treatment Gait belt  Activity Tolerance Patient tolerated treatment well;Patient limited by pain  Patient left in chair;with call bell/phone within reach  Nurse Communication Mobility status   PT - Assessment/Plan  PT Plan Current plan remains appropriate  PT Visit Diagnosis Unsteadiness on feet (R26.81);Other abnormalities of gait and mobility (R26.89);Muscle weakness (generalized)  (M62.81);Difficulty in  walking, not elsewhere classified (R26.2);Pain  Pain - Right/Left Left  Pain - part of body Ankle and joints of foot  PT Frequency (ACUTE ONLY) Min 3X/week  Follow Up Recommendations SNF  PT equipment Wheelchair cushion (measurements PT);Wheelchair (measurements PT)  AM-PAC PT "6 Clicks" Mobility Outcome Measure (Version 2)  Help needed turning from your back to your side while in a flat bed without using bedrails? 4  Help needed moving from lying on your back to sitting on the side of a flat bed without using bedrails? 4  Help needed moving to and from a bed to a chair (including a wheelchair)? 3  Help needed standing up from a chair using your arms (e.g., wheelchair or bedside chair)? 3  Help needed to walk in hospital room? 3  Help needed climbing 3-5 steps with a railing?  1  6 Click Score 18  Consider Recommendation of Discharge To: Home with [facility]  PT Goal Progression  Progress towards PT goals Progressing toward goals  Acute Rehab PT Goals  PT Goal Formulation With patient  Time For Goal Achievement 02/12/20  Potential to Achieve Goals Fair  PT Time Calculation  PT Start Time (ACUTE ONLY) 1030  PT Stop Time (ACUTE ONLY) 1102  PT Time Calculation (min) (ACUTE ONLY) 32 min  PT General Charges  $$ ACUTE PT VISIT 1 Visit  PT Treatments  $Gait Training 8-22 mins  $Therapeutic Exercise 8-22 mins   Ritisha Deitrick B. Migdalia Dk PT, DPT Acute Rehabilitation Services Pager [phone] Office [phone]

## 2020-02-05 NOTE — Progress Notes (Signed)
The patient is 6 days status post ray amputation.  Yesterday when he got up he bumped his foot quite hard and had a copious amount of bleeding his heparin was discontinued for several hours and he wore a dressing that was changed quite a few times.  By about mid [place] the bleeding had decreased.  Heparin was restarted.  This morning he states he is feeling well.  There is a dressing in place now that is dry on the surface and has not been changed since yesterday  Focused examination the dressing was removed he has a minimal amount of bleeding on the periphery of the wound.  Wound edges remain approximated without dehiscence new dressing was applied

## 2020-02-06 ENCOUNTER — Ambulatory Visit: Payer: Self-pay | Admitting: Family

## 2020-02-06 LAB — CBC
HCT: 25.5 % — ABNORMAL LOW (ref 39.0–52.0)
Hemoglobin: 8.1 g/dL — ABNORMAL LOW (ref 13.0–17.0)
MCH: 26.4 pg (ref 26.0–34.0)
MCHC: 31.8 g/dL (ref 30.0–36.0)
MCV: 83.1 fL (ref 80.0–100.0)
Platelets: 192 10*3/uL (ref 150–400)
RBC: 3.07 MIL/uL — ABNORMAL LOW (ref 4.22–5.81)
RDW: 15 % (ref 11.5–15.5)
WBC: 3.3 10*3/uL — ABNORMAL LOW (ref 4.0–10.5)
nRBC: 0 % (ref 0.0–0.2)

## 2020-02-06 LAB — BASIC METABOLIC PANEL
Anion gap: 10 (ref 5–15)
BUN: 8 mg/dL (ref 6–20)
CO2: 24 mmol/L (ref 22–32)
Calcium: 8.6 mg/dL — ABNORMAL LOW (ref 8.9–10.3)
Chloride: 101 mmol/L (ref 98–111)
Creatinine, Ser: 0.74 mg/dL (ref 0.61–1.24)
GFR calc Af Amer: >60 mL/min (ref >60)
GFR calc non Af Amer: >60 mL/min (ref >60)
Glucose, Bld: 109 mg/dL — ABNORMAL HIGH (ref 70–99)
Potassium: 4.2 mmol/L (ref 3.5–5.1)
Sodium: 135 mmol/L (ref 135–145)

## 2020-02-06 LAB — HEPARIN LEVEL (UNFRACTIONATED): Heparin Unfractionated: 0.45 IU/mL (ref 0.30–0.70)

## 2020-02-06 LAB — PROTIME-INR
INR: 1.5 — ABNORMAL HIGH (ref 0.8–1.2)
Prothrombin Time: 17.7 seconds — ABNORMAL HIGH (ref 11.4–15.2)

## 2020-02-06 LAB — MAGNESIUM: Magnesium: 1.6 mg/dL — ABNORMAL LOW (ref 1.7–2.4)

## 2020-02-06 MED ORDER — WARFARIN SODIUM 5 MG PO TABS
10.0000 mg | ORAL_TABLET | Freq: Once | ORAL | Status: AC
  Administered 2020-02-06: 10 mg via ORAL
  Filled 2020-02-06: qty 2

## 2020-02-06 MED ORDER — MAGNESIUM SULFATE 4 GM/100ML IV SOLN
4.0000 g | Freq: Once | INTRAVENOUS | Status: AC
  Administered 2020-02-06: 4 g via INTRAVENOUS
  Filled 2020-02-06: qty 100

## 2020-02-06 NOTE — Progress Notes (Signed)
ANTICOAGULATION CONSULT NOTE - Follow Up Consult  Pharmacy Consult for Heparin/Coumadin Indication: recurrent PE  Allergies  Allergen Reactions  . Other Other (See Comments)    "Cactus" caused blisters on tongue (if prepared as food)    Patient Measurements: Height: 5\' 9"  (175.3 cm)(office visit 01/23/20) Weight: 195 lb 8.8 oz (88.7 kg) IBW/kg (Calculated) : 70.7 Heparin Dosing Weight:  88.7 kg  Vital Signs: Temp: 98.5 F (36.9 C) (03/31 0440) Temp Source: Oral (03/31 0440) BP: 127/90 (03/31 0440) Pulse Rate: 98 (03/31 0440)  Labs: Recent Labs    02/04/20 0526 02/04/20 0526 02/05/20 0347 02/06/20 0210  HGB 9.5*   < > 8.4* 8.1*  HCT 29.8*  --  26.4* 25.5*  PLT 158  --  177 192  LABPROT 16.0*  --  16.9* 17.7*  INR 1.3*  --  1.4* 1.5*  HEPARINUNFRC 0.44  --  0.28* 0.45  CREATININE  --   --   --  0.74   < > = values in this interval not displayed.    Estimated Creatinine Clearance: 108.2 mL/min (by C-G formula based on SCr of 0.74 mg/dL).   Assessment:  Anticoag: Warfarin PTA for h/o recurrent PE (04/2016), s/p mitral valve repair (10/2016). Ortho 3rd ray amputation 3/24>>resume heparin/warfarin. HL 0.45 in goal.. INR 1.5. Hgb 9.5>8.1 s/p bleeding 3/29.Marland Kitchen Plts 192 ok. No further foot bleeding noted per RN. - 3/29 PA noted bumped foot early this morning producing copious bleeding. Hep held per PA note. Concern for hematoma. Hep resumed in the afternoon.  - PTA warfarin dose 5mg  daily. Per ED provider patient also states he takes xarelto?? Looks like taking lovenox PTA as well to end on 3/21. Admit INR 3.8 (3/21) - S/p Vit K 2.5 mg IV 3/22   Goal of Therapy:  Heparin level 0.3-0.7 units/ml  INR 2-3 Monitor platelets by anticoagulation protocol: Yes   Plan:  Con't Heparin at 1350 units/hr Warfarin 10 mg x1  Daily HL, CBC, and INR Magnesium 4g IV x 1 today  Torina Ey S. Alford Highland, PharmD, BCPS Clinical Staff Pharmacist [email or web address] Alford Highland, Luzmaria Devaux  [place] 02/06/2020,8:41 AM

## 2020-02-06 NOTE — TOC Progression Note (Signed)
Transition of Care [facility]) - Progression Note    Patient Details  Name: Leonard Bentley MRN: [id number] Date of Birth: 04-Jun-1961  Transition of Care [facility]) CM/SW [place], [place] Work Phone Number: 02/06/2020, 2:52 PM  Clinical Narrative:    MSW spoke with patient regarding current bed offers, pt wanted to speak with toc team. CSW stated that toc has no bed offers at this time. Pt gave CSW a phone number to potential housing at [phone]) that he spoke with.  CSW contacted Earlean Polka at [phone]). Onalee Hua said to fax referral to her.   CSW contacted pt back and said that we sent the referrals out. All other questions about resources were answered at this time. CSW/LCSW will continue to follow up with discharge plans.      Barriers to Discharge: Continued Medical Work up, SNF Pending bed offer, SNF Pending payor source - LOG  Expected Discharge Plan and Services   In-house Referral: Clinical Social Work, Risk manager Acute Care Choice: [place] Living arrangements for the past 2 months: [place] Determinants of Health (SDOH) Interventions    Readmission Risk Interventions Readmission Risk Prevention Plan 01/31/2020 01/16/2020 01/18/2019  Transportation Screening Complete Complete Complete  PCP or Specialist Appt within 5-7 Days Complete Complete -  Home Care Screening Complete Complete -  Medication Review (RN CM) Complete Complete -  Medication Review ([place]) - - Complete  HRI or [place] - - Complete  [place] - - Not Applicable  Some recent data might be hidden

## 2020-02-06 NOTE — Progress Notes (Signed)
PROGRESS NOTE        PATIENT DETAILS Name: Leonard Bentley Age: 59 y.o. Sex: male Date of Birth: 12-14-1960 Admit Date: 01/27/2020 Admitting Physician [name], MD [id number], Vernia Buff, NP  Brief Narrative: Patient is a 59 y.o. male with history of chronic diastolic heart failure, severe MR s/p mitral valve repair, recurrent PE on Coumadin, non-Hodgkin's lymphoma, alcohol abuse recent hospitalization from 3/5-3/12 osteomyelitis-requiring left first and second ray amputation who presented with wound dehiscence (noncompliant to weightbearing status), drainage and newly displaced fracture of the third metatarsal head with underlying osteomyelitis.  Patient was evaluated by orthopedics and underwent left third ray amputation.   Significant events: 3/5-3/12>> admission for left foot osteomyelitis-s/p left first and second ray amputation 3/21>> admit for left foot wound dehiscence-third metatarsal head displaced fracture with possible underlying osteomyelitis   Antimicrobial therapy: Ceftriaxone: 3/21>> 3/24 Vancomycin: 3/21>> 3/25  Microbiology data: 3/21>> blood culture: Negative  Procedures : 3/24>> left third ray amputation  Consults: Orthopedics  DVT Prophylaxis : Full dose anticoagulation with Heparin/Coumadin  Subjective: Sitting at bedside-washing himself.  Denies any chest pain or shortness of breath.  Assessment/Plan: Left foot wound dehiscence with left third metatarsal fracture with possible underlying osteomyelitis-s/p left third ray amputation: Orthopedics following-continue supportive care.  Per orthopedics-needs to be strict nonweightbearing on the left foot.  Anemia: No evidence of blood loss-I suspect this is secondary to inflammation related to chronic osteomyelitis/wound infection.  Follow for now.  Chronic diastolic heart failure: Compensated-follow volume status closely  History of severe MR-s/p mitral valve repair with  annular ring prosthesis 2017: Stable-outpatient follow-up with cardiology  History of recurrent pulmonary embolism: On overlapping heparin/Coumadin-pharmacy managing-INR still subtherapeutic  History of SVT/PACs: Stable-on beta-blocker.  History of tobacco use: Continue transdermal nicotine  History of non-Hodgkin's lymphoma in remission: Follow-up with oncology in the outpatient setting  Diet: Diet Order            Diet Carb Modified Fluid consistency: Thin; Room service appropriate? Yes  Diet effective now              Code Status: Full code  Family Communication: Patient to update family-he is awake and alert.  Disposition Plan:  SNF when ready for discharge  Barriers to Discharge: Awaiting SNF bed  Antimicrobial agents: Anti-infectives (From admission, onward)   Start     Dose/Rate Route Frequency Ordered Stop   01/30/20 0830  ceFAZolin (ANCEF) IVPB 2g/100 mL premix  Status:  Discontinued     2 g 200 mL/hr over 30 Minutes Intravenous To Short Stay 01/30/20 0824 01/30/20 1330   01/28/20 1800  cefTRIAXone (ROCEPHIN) 2 g in sodium chloride 0.9 % 100 mL IVPB     2 g 200 mL/hr over 30 Minutes Intravenous Every 24 hours 01/27/20 2211 01/30/20 1900   01/28/20 0600  vancomycin (VANCOCIN) IVPB 1000 mg/200 mL premix     1,000 mg 200 mL/hr over 60 Minutes Intravenous Every 12 hours 01/27/20 1738 01/31/20 1006   01/27/20 1730  vancomycin (VANCOCIN) IVPB 1000 mg/200 mL premix  Status:  Discontinued     1,000 mg 200 mL/hr over 60 Minutes Intravenous  Once 01/27/20 1725 01/27/20 1726   01/27/20 1730  cefTRIAXone (ROCEPHIN) 2 g in sodium chloride 0.9 % 100 mL IVPB     2 g 200 mL/hr over 30 Minutes Intravenous  Once 01/27/20  1725 01/27/20 2010   01/27/20 1730  vancomycin (VANCOREADY) IVPB 2000 mg/400 mL     2,000 mg 200 mL/hr over 120 Minutes Intravenous  Once 01/27/20 1726 01/27/20 2219       Time spent: 25- minutes-Greater than 50% of this time was spent in counseling,  explanation of diagnosis, planning of further management, and coordination of care.  MEDICATIONS: Scheduled Meds: . docusate sodium  100 mg Oral BID  . folic acid  1 mg Oral Daily  . metoprolol tartrate  25 mg Oral BID  . multivitamin with minerals  1 tablet Oral Daily  . nicotine  14 mg Transdermal Daily  . thiamine  100 mg Oral Daily   Or  . thiamine  100 mg Intravenous Daily  . Warfarin - Pharmacist Dosing Inpatient   Does not apply q1800   Continuous Infusions: . heparin 1,350 Units/hr (02/06/20 1520)  . lactated ringers 10 mL/hr at 02/04/20 1304   PRN Meds:.acetaminophen **OR** acetaminophen, LORazepam, metoprolol tartrate, morphine injection, ondansetron **OR** ondansetron (ZOFRAN) IV, oxyCODONE   PHYSICAL EXAM: Vital signs: Vitals:   02/05/20 2027 02/05/20 2038 02/06/20 0440 02/06/20 1258  BP: (!) 121/103 (!) 121/103 127/90 (!) 121/102  Pulse:  (!) 107 98 78  Resp:  20 18 18   Temp:  99.1 F (37.3 C) 98.5 F (36.9 C) 98 F (36.7 C)  TempSrc:  Oral Oral Oral  SpO2:  100% 99% 100%  Weight:      Height:       Filed Weights   01/31/20 0354 01/31/20 0748 02/01/20 0449  Weight: 88.5 kg 88.5 kg 88.7 kg   Body mass index is 28.88 kg/m.   Gen Exam:Alert awake-not in any distress HEENT:atraumatic, normocephalic Chest: B/L clear to auscultation anteriorly CVS:S1S2 regular Abdomen:soft non tender, non distended Extremities:no edema Neurology: Non focal Skin: no rash  I have personally reviewed following labs and imaging studies  LABORATORY DATA: CBC: Recent Labs  Lab 02/02/20 0222 02/03/20 0431 02/04/20 0526 02/05/20 0347 02/06/20 0210  WBC 4.3 3.5* 3.0* 3.1* 3.3*  HGB 9.6* 9.4* 9.5* 8.4* 8.1*  HCT 29.7* 30.0* 29.8* 26.4* 25.5*  MCV 83.9 84.0 83.9 83.3 83.1  PLT 163 183 158 177 [id number]    Basic Metabolic Panel: Recent Labs  Lab 01/31/20 0258 01/31/20 2335 02/02/20 0222 02/04/20 0220 02/06/20 0210  NA 135 138 138  --  135  K 4.1 3.9 3.7  --  4.2    CL 102 104 105  --  101  CO2 24 25 25   --  24  GLUCOSE 117* 107* 112*  --  109*  BUN 9 10 8   --  8  CREATININE 0.82 0.83 0.69  --  0.74  CALCIUM 8.7* 8.7* 8.7*  --  8.6*  MG 1.6* 1.6* 1.5* 1.5* 1.6*  PHOS 2.8 3.9 3.5  --   --     GFR: Estimated Creatinine Clearance: 108.2 mL/min (by C-G formula based on SCr of 0.74 mg/dL).  Liver Function Tests: Recent Labs  Lab 01/31/20 0258 01/31/20 2335 02/02/20 0222  ALBUMIN 2.8* 2.9* 2.7*   No results for input(s): LIPASE, AMYLASE in the last 168 hours. No results for input(s): AMMONIA in the last 168 hours.  Coagulation Profile: Recent Labs  Lab 02/02/20 0222 02/03/20 0431 02/04/20 0526 02/05/20 0347 02/06/20 0210  INR 1.2 1.3* 1.3* 1.4* 1.5*    Cardiac Enzymes: No results for input(s): CKTOTAL, CKMB, CKMBINDEX, TROPONINI in the last 168 hours.  BNP (last 3 results) No results  for input(s): PROBNP in the last 8760 hours.  Lipid Profile: Recent Labs    02/05/20 1648  CHOL 164  HDL 50  LDLCALC 100*  TRIG 71  CHOLHDL 3.3    Thyroid Function Tests: No results for input(s): TSH, T4TOTAL, FREET4, T3FREE, THYROIDAB in the last 72 hours.  Anemia Panel: No results for input(s): VITAMINB12, FOLATE, FERRITIN, TIBC, IRON, RETICCTPCT in the last 72 hours.  Urine analysis:    Component Value Date/Time   COLORURINE YELLOW 01/27/2020 1841   APPEARANCEUR CLEAR 01/27/2020 1841   APPEARANCEUR Clear 08/03/2019 1143   LABSPEC 1.015 01/27/2020 1841   PHURINE 6.0 01/27/2020 1841   GLUCOSEU NEGATIVE 01/27/2020 1841   HGBUR SMALL (A) 01/27/2020 1841   BILIRUBINUR NEGATIVE 01/27/2020 1841   BILIRUBINUR small (A) 08/03/2019 1144   BILIRUBINUR Negative 08/03/2019 1143   KETONESUR 5 (A) 01/27/2020 1841   PROTEINUR 30 (A) 01/27/2020 1841   UROBILINOGEN 1.0 08/03/2019 1144   UROBILINOGEN 1.0 01/25/2013 0825   NITRITE NEGATIVE 01/27/2020 1841   LEUKOCYTESUR NEGATIVE 01/27/2020 1841    Sepsis Labs: Lactic Acid, Venous     Component Value Date/Time   LATICACIDVEN 1.2 01/28/2020 0240    MICROBIOLOGY: Recent Results (from the past 240 hour(s))  Blood culture (routine x 2)     Status: None   Collection Time: 01/27/20  6:07 PM   Specimen: BLOOD RIGHT ARM  Result Value Ref Range Status   Specimen Description BLOOD RIGHT ARM  Final   Special Requests   Final    BOTTLES DRAWN AEROBIC AND ANAEROBIC Blood Culture results may not be optimal due to an excessive volume of blood received in culture bottles   Culture   Final    NO GROWTH 5 DAYS Performed at [facility], [place] [address], [place], [place] [address]    Report Status 02/01/2020 FINAL  Final  Blood culture (routine x 2)     Status: None   Collection Time: 01/27/20  6:07 PM   Specimen: BLOOD LEFT HAND  Result Value Ref Range Status   Specimen Description BLOOD LEFT HAND  Final   Special Requests   Final    BOTTLES DRAWN AEROBIC AND ANAEROBIC Blood Culture results may not be optimal due to an excessive volume of blood received in culture bottles   Culture   Final    NO GROWTH 5 DAYS Performed at [facility], [place] [address] [place], [place] [address]    Report Status 02/01/2020 FINAL  Final  Respiratory Panel by RT PCR (Flu A&B, Covid) - Nasopharyngeal Swab     Status: None   Collection Time: 01/27/20  8:09 PM   Specimen: Nasopharyngeal Swab  Result Value Ref Range Status   SARS Coronavirus 2 by RT PCR NEGATIVE NEGATIVE Final    Comment: (NOTE) SARS-CoV-2 target nucleic acids are NOT DETECTED. The SARS-CoV-2 RNA is generally detectable in upper respiratoy specimens during the acute phase of infection. The lowest concentration of SARS-CoV-2 viral copies this assay can detect is 131 copies/mL. A negative result does not preclude SARS-Cov-2 infection and should not be used as the sole basis for treatment or other patient management decisions. A negative result may occur with  improper specimen collection/handling, submission of  specimen other than nasopharyngeal swab, presence of viral mutation(s) within the areas targeted by this assay, and inadequate number of viral copies (<131 copies/mL). A negative result must be combined with clinical observations, patient history, and epidemiological information. The expected result is Negative.  Fact Sheet for Patients:  PinkCheek.be Fact Sheet for Healthcare Providers:  GravelBags.it This test is not yet ap proved or cleared by the [place] FDA and  has been authorized for detection and/or diagnosis of SARS-CoV-2 by FDA under an Emergency Use Authorization (EUA). This EUA will remain  in effect (meaning this test can be used) for the duration of the COVID-19 declaration under Section 564(b)(1) of the Act, 21 U.S.C. section 360bbb-3(b)(1), unless the authorization is terminated or revoked sooner.    Influenza A by PCR NEGATIVE NEGATIVE Final   Influenza B by PCR NEGATIVE NEGATIVE Final    Comment: (NOTE) The Xpert Xpress SARS-CoV-2/FLU/RSV assay is intended as an aid in  the diagnosis of influenza from Nasopharyngeal swab specimens and  should not be used as a sole basis for treatment. Nasal washings and  aspirates are unacceptable for Xpert Xpress SARS-CoV-2/FLU/RSV  testing. Fact Sheet for Patients: PinkCheek.be Fact Sheet for Healthcare Providers: GravelBags.it This test is not yet approved or cleared by the [place] FDA and  has been authorized for detection and/or diagnosis of SARS-CoV-2 by  FDA under an Emergency Use Authorization (EUA). This EUA will remain  in effect (meaning this test can be used) for the duration of the  Covid-19 declaration under Section 564(b)(1) of the Act, 21  U.S.C. section 360bbb-3(b)(1), unless the authorization is  terminated or revoked. Performed at [facility], [place] [address] [place],  [place] [address]     RADIOLOGY STUDIES/RESULTS: No results found.   LOS: 10 days   Oren Binet, MD  [facility]    To contact the attending provider between 7A-7P or the covering provider during after hours 7P-7A, please log into the web site [email or web address] and access using universal [place] password for that web site. If you do not have the password, please call the hospital operator.  02/06/2020, 3:41 PM

## 2020-02-07 LAB — PROTIME-INR
INR: 1.6 — ABNORMAL HIGH (ref 0.8–1.2)
Prothrombin Time: 19.2 seconds — ABNORMAL HIGH (ref 11.4–15.2)

## 2020-02-07 LAB — BASIC METABOLIC PANEL
Anion gap: 10 (ref 5–15)
BUN: 6 mg/dL (ref 6–20)
CO2: 26 mmol/L (ref 22–32)
Calcium: 9 mg/dL (ref 8.9–10.3)
Chloride: 101 mmol/L (ref 98–111)
Creatinine, Ser: 0.75 mg/dL (ref 0.61–1.24)
GFR calc Af Amer: >60 mL/min (ref >60)
GFR calc non Af Amer: >60 mL/min (ref >60)
Glucose, Bld: 116 mg/dL — ABNORMAL HIGH (ref 70–99)
Potassium: 3.8 mmol/L (ref 3.5–5.1)
Sodium: 137 mmol/L (ref 135–145)

## 2020-02-07 LAB — CBC
HCT: 27.9 % — ABNORMAL LOW (ref 39.0–52.0)
Hemoglobin: 8.9 g/dL — ABNORMAL LOW (ref 13.0–17.0)
MCH: 27 pg (ref 26.0–34.0)
MCHC: 31.9 g/dL (ref 30.0–36.0)
MCV: 84.5 fL (ref 80.0–100.0)
Platelets: 215 10*3/uL (ref 150–400)
RBC: 3.3 MIL/uL — ABNORMAL LOW (ref 4.22–5.81)
RDW: 15.1 % (ref 11.5–15.5)
WBC: 3.8 10*3/uL — ABNORMAL LOW (ref 4.0–10.5)
nRBC: 0 % (ref 0.0–0.2)

## 2020-02-07 LAB — HEPARIN LEVEL (UNFRACTIONATED): Heparin Unfractionated: 0.59 IU/mL (ref 0.30–0.70)

## 2020-02-07 LAB — MAGNESIUM: Magnesium: 1.7 mg/dL (ref 1.7–2.4)

## 2020-02-07 MED ORDER — MAGNESIUM SULFATE 2 GM/50ML IV SOLN
2.0000 g | Freq: Once | INTRAVENOUS | Status: AC
  Administered 2020-02-07: 2 g via INTRAVENOUS
  Filled 2020-02-07: qty 50

## 2020-02-07 MED ORDER — WARFARIN SODIUM 5 MG PO TABS
10.0000 mg | ORAL_TABLET | Freq: Once | ORAL | Status: AC
  Administered 2020-02-07: 10 mg via ORAL
  Filled 2020-02-07: qty 2

## 2020-02-07 NOTE — Progress Notes (Signed)
ANTICOAGULATION CONSULT NOTE - Follow Up Consult  Pharmacy Consult for Heparin/Coumadin Indication: recurrent PE  Allergies  Allergen Reactions  . Other Other (See Comments)    "Cactus" caused blisters on tongue (if prepared as food)    Patient Measurements: Height: 5\' 9"  (175.3 cm)(office visit 01/23/20) Weight: 92.7 kg (204 lb 5.9 oz) IBW/kg (Calculated) : 70.7 Heparin Dosing Weight:  88.7 kg  Vital Signs: Temp: 98.2 F (36.8 C) (04/01 0509) Temp Source: Oral (04/01 0509) BP: 135/105 (04/01 0509) Pulse Rate: 72 (04/01 0509)  Labs: Recent Labs    02/05/20 0347 02/05/20 0347 02/06/20 0210 02/07/20 0257  HGB 8.4*   < > 8.1* 8.9*  HCT 26.4*  --  25.5* 27.9*  PLT 177  --  192 215  LABPROT 16.9*  --  17.7* 19.2*  INR 1.4*  --  1.5* 1.6*  HEPARINUNFRC 0.28*  --  0.45 0.59  CREATININE  --   --  0.74 0.75   < > = values in this interval not displayed.    Estimated Creatinine Clearance: 110.4 mL/min (by C-G formula based on SCr of 0.75 mg/dL).   Assessment:  Anticoag: Warfarin PTA for h/o recurrent PE (04/2016), s/p mitral valve repair (10/2016) with annular ring prosthesis. Ortho 3rd ray amputation 3/24>>resume heparin/warfarin. HL 0.59 in goal.. INR 1.6. Hgb 8.9 s/p bleeding 3/29.Marland Kitchen Plts 215 ok.  - 3/29 PA noted bumped foot early this morning producing copious bleeding. Hep held per PA note. Concern for hematoma. Hep held then resumed in the afternoon.  - PTA warfarin dose 5mg  daily. Per ED provider patient also states he takes xarelto?? Looks like taking lovenox PTA as well to end on 3/21. Admit INR 3.8 (3/21) - S/p Vit K 2.5 mg IV 3/22   Goal of Therapy:  Heparin level 0.3-0.7 units/ml  INR 2-3 Monitor platelets by anticoagulation protocol: Yes   Plan:  Con't Heparin at 1350 units/hr Warfarin 10 mg x1 again tonight Daily HL, CBC, and INR   Mio Schellinger S. Alford Highland, PharmD, BCPS Clinical Staff Pharmacist [email or web address] Alford Highland, [place] 02/07/2020,8:18  AM

## 2020-02-07 NOTE — Progress Notes (Signed)
Occupational Therapy Treatment Patient Details Name: Leonard Bentley MRN: [id number] DOB: 05-20-1961 Today's Date: 02/07/2020    History of present illness 59 y.o. male with what a recent left foot first and second ray amputation for osteomyelitis and discharged on March 10/27/2020 with history of minimally invasive mitral valve repair, chronic combined systolic and diastolic CHF last EF is improved to 55 to 60%, alcohol abuse, non-Hodgkin's lymphoma stage II in remission presents to the ER with complaint of increasing pain and discharge from the left foot. following L foot first and 2nd ray amputation.   X-rays show fracture involving the left third metatarsal concerning for osteomyelitis. Admitted 01/27/20 for amputation revision sx and s/p 3rd ray amputation on 01/30/20.   OT comments  Pt progressing with OT goals and continues to be motivated to participate in therapy. Pt Modified Independent for bed mobility. Pt min guard sit to stand transfers with RW. Assessed LB dressing via simulation with loop - pt min guard for this task to maintain balance in standing. Pt min guard for mobility to bathroom and throughout room using RW - intermittent cues for pacing and safety techniques provided. Pt min guard/supervision for toilet transfer and toileting task. Pt min guard for grooming tasks while standing at sink. Pt noted to be shaky throughout mobility today and continues to require safety reminders to decrease fall risk. Pt actively engaged in therapy session and frequently asks for feedback. Will continue to follow acutely.   Follow Up Recommendations  SNF;Supervision/Assistance - 24 hour    Equipment Recommendations  3 in 1 bedside commode    Recommendations for Other Services      Precautions / Restrictions Precautions Precautions: Fall Restrictions Weight Bearing Restrictions: Yes LLE Weight Bearing: Non weight bearing       Mobility Bed Mobility Overal bed mobility: Modified  Independent Bed Mobility: Supine to Sit;Sit to Supine              Transfers Overall transfer level: Needs assistance Equipment used: Rolling walker (2 wheeled) Transfers: Sit to/from [facility] Sit to Stand: Supervision Stand pivot transfers: Min guard;Supervision       General transfer comment: min guard progressing to supervision at times with cues for safety    Balance Overall balance assessment: Needs assistance Sitting-balance support: Feet supported Sitting balance-Leahy Scale: Good     Standing balance support: Bilateral upper extremity supported;During functional activity Standing balance-Leahy Scale: Fair                             ADL either performed or assessed with clinical judgement   ADL Overall ADL's : Needs assistance/impaired     Grooming: Min guard;Oral care;Standing Grooming Details (indicate cue type and reason): min guard for task standing at sink, noted to be shaky, provided safety education              Lower Body Dressing: Min guard;Sit to/from stand Lower Body Dressing Details (indicate cue type and reason): min guard to maintain balance with LB dressing donning over waist, unsteady in standing during this activity  Toilet Transfer: Min guard;Ambulation;Regular Glass blower/designer Details (indicate cue type and reason): Min guard for ambulation, cues for RW mgmt and safety techniques with NWB status Toileting- Clothing Manipulation and Hygiene: Min guard;Sit to/from stand;Sitting/lateral lean Toileting - Clothing Manipulation Details (indicate cue type and reason): min guard to ensure safety in lowering self to toilet      Functional mobility  during ADLs: Minimal assistance;Rolling walker;Cueing for safety;Cueing for sequencing General ADL Comments: Pt continues to be shaky when ambulating      Vision       Perception     Praxis      Cognition Arousal/Alertness: Awake/alert Behavior During  Therapy: WFL for tasks assessed/performed Overall Cognitive Status: No family/caregiver present to determine baseline cognitive functioning Area of Impairment: Safety/judgement;Awareness                         Safety/Judgement: Decreased awareness of safety;Decreased awareness of deficits     General Comments: Pt continues with decreased problem solving and awareness of limiations due to NWB status. Pt requires cues for pacing and safety throughout        Exercises     Shoulder Instructions       General Comments      Pertinent Vitals/ Pain       Pain Assessment: Faces Faces Pain Scale: Hurts a little bit Pain Location: L foot Pain Descriptors / Indicators: Aching;Sore;Throbbing Pain Intervention(s): Premedicated before session  Home Living                                          Prior Functioning/Environment              Frequency  Min 2X/week        Progress Toward Goals  OT Goals(current goals can now be found in the care plan section)  Progress towards OT goals: Progressing toward goals  Acute Rehab OT Goals Patient Stated Goal: to be able to walker further, to improve OT Goal Formulation: With patient Time For Goal Achievement: 02/14/20 Potential to Achieve Goals: Good ADL Goals Pt Will Perform Grooming: standing;with set-up Pt Will Perform Lower Body Bathing: sit to/from stand;sitting/lateral leans;with set-up Pt Will Perform Lower Body Dressing: with set-up;sit to/from stand;sitting/lateral leans Pt Will Transfer to Toilet: with supervision;ambulating;regular height toilet Pt Will Perform Toileting - Clothing Manipulation and hygiene: with modified independence;sitting/lateral leans;sit to/from stand  Plan Discharge plan remains appropriate;Frequency remains appropriate    Co-evaluation                 AM-PAC OT "6 Clicks" Daily Activity     Outcome Measure   Help from another person eating meals?:  None Help from another person taking care of personal grooming?: A Little Help from another person toileting, which includes using toliet, bedpan, or urinal?: A Little Help from another person bathing (including washing, rinsing, drying)?: A Little Help from another person to put on and taking off regular upper body clothing?: A Little Help from another person to put on and taking off regular lower body clothing?: A Little 6 Click Score: 19    End of Session Equipment Utilized During Treatment: Gait belt;Rolling walker  OT Visit Diagnosis: Unsteadiness on feet (R26.81);Other abnormalities of gait and mobility (R26.89);Pain Pain - Right/Left: Left Pain - part of body: Ankle and joints of foot   Activity Tolerance Patient tolerated treatment well   Patient Left in bed;with call bell/phone within reach   Nurse Communication Mobility status        Time: [id number] OT Time Calculation (min): 40 min  Charges: OT General Charges $OT Visit: 1 Visit OT Treatments $Self Care/Home Management : 23-37 mins $Therapeutic Activity: 8-22 mins  Layla Maw, OTR/L   Layla Maw 02/07/2020,  10:40 AM

## 2020-02-07 NOTE — Progress Notes (Signed)
PROGRESS NOTE        PATIENT DETAILS Name: Leonard Bentley Age: 59 y.o. Sex: male Date of Birth: [birth year] Admit Date: 01/27/2020 Admitting Physician [name], MD [id number], Vernia Buff, NP  Brief Narrative: Patient is a 59 y.o. male with history of chronic diastolic heart failure, severe MR s/p mitral valve repair, recurrent PE on Coumadin, non-Hodgkin's lymphoma, alcohol abuse recent hospitalization from 3/5-3/12 osteomyelitis-requiring left first and second ray amputation who presented with wound dehiscence (noncompliant to weightbearing status), drainage and newly displaced fracture of the third metatarsal head with underlying osteomyelitis.  Patient was evaluated by orthopedics and underwent left third ray amputation.  Significant events: 3/5-3/12>> admission for left foot osteomyelitis-s/p left first and second ray amputation 3/21>> admit for left foot wound dehiscence-third metatarsal head displaced fracture with possible underlying osteomyelitis   Antimicrobial therapy: Ceftriaxone: 3/21>> 3/24 Vancomycin: 3/21>> 3/25  Microbiology data: 3/21>> blood culture: Negative  Procedures : 3/24>> left third ray amputation  Consults: Orthopedics  DVT Prophylaxis : Full dose anticoagulation with Heparin/Coumadin  Subjective: Lying comfortably in bed-no chest pain or shortness of breath.  Assessment/Plan: Left foot wound dehiscence with left third metatarsal fracture with possible underlying osteomyelitis-s/p left third ray amputation: Orthopedics following-continue supportive care.  Per orthopedics-needs to be strict nonweightbearing on the left foot.  Anemia: No evidence of blood loss-I suspect this is secondary to inflammation related to chronic osteomyelitis/wound infection.  Follow for now.  Chronic diastolic heart failure: Compensated-follow volume status closely  History of severe MR-s/p mitral valve repair with annular ring prosthesis  2017: Stable-outpatient follow-up with cardiology  History of recurrent pulmonary embolism: On overlapping heparin/Coumadin-pharmacy managing-INR still subtherapeutic  History of SVT/PACs: Stable-on beta-blocker.  History of tobacco use: Continue transdermal nicotine  History of non-Hodgkin's lymphoma in remission: Follow-up with oncology in the outpatient setting  Diet: Diet Order            Diet Carb Modified Fluid consistency: Thin; Room service appropriate? Yes  Diet effective now              Code Status: Full code  Family Communication: Patient to update family-he is awake and alert.  Disposition Plan:  SNF when ready for discharge  Barriers to Discharge: Awaiting SNF bed  Antimicrobial agents: Anti-infectives (From admission, onward)   Start     Dose/Rate Route Frequency Ordered Stop   01/30/20 0830  ceFAZolin (ANCEF) IVPB 2g/100 mL premix  Status:  Discontinued     2 g 200 mL/hr over 30 Minutes Intravenous To Short Stay 01/30/20 0824 01/30/20 1330   01/28/20 1800  cefTRIAXone (ROCEPHIN) 2 g in sodium chloride 0.9 % 100 mL IVPB     2 g 200 mL/hr over 30 Minutes Intravenous Every 24 hours 01/27/20 2211 01/30/20 1900   01/28/20 0600  vancomycin (VANCOCIN) IVPB 1000 mg/200 mL premix     1,000 mg 200 mL/hr over 60 Minutes Intravenous Every 12 hours 01/27/20 1738 01/31/20 1006   01/27/20 1730  vancomycin (VANCOCIN) IVPB 1000 mg/200 mL premix  Status:  Discontinued     1,000 mg 200 mL/hr over 60 Minutes Intravenous  Once 01/27/20 1725 01/27/20 1726   01/27/20 1730  cefTRIAXone (ROCEPHIN) 2 g in sodium chloride 0.9 % 100 mL IVPB     2 g 200 mL/hr over 30 Minutes Intravenous  Once 01/27/20 1725 01/27/20 2010  01/27/20 1730  vancomycin (VANCOREADY) IVPB 2000 mg/400 mL     2,000 mg 200 mL/hr over 120 Minutes Intravenous  Once 01/27/20 1726 01/27/20 2219       Time spent: 15- minutes-Greater than 50% of this time was spent in counseling, explanation of diagnosis,  planning of further management, and coordination of care.  MEDICATIONS: Scheduled Meds: . docusate sodium  100 mg Oral BID  . folic acid  1 mg Oral Daily  . metoprolol tartrate  25 mg Oral BID  . multivitamin with minerals  1 tablet Oral Daily  . nicotine  14 mg Transdermal Daily  . thiamine  100 mg Oral Daily   Or  . thiamine  100 mg Intravenous Daily  . warfarin  10 mg Oral ONCE-1600  . Warfarin - Pharmacist Dosing Inpatient   Does not apply q1800   Continuous Infusions: . heparin 1,350 Units/hr (02/06/20 2340)  . lactated ringers 10 mL/hr at 02/04/20 1304   PRN Meds:.acetaminophen **OR** acetaminophen, metoprolol tartrate, morphine injection, ondansetron **OR** ondansetron (ZOFRAN) IV, oxyCODONE   PHYSICAL EXAM: Vital signs: Vitals:   02/06/20 2017 02/07/20 0020 02/07/20 0509 02/07/20 1156  BP: 132/89 (!) 119/92 (!) 135/105 (!) 131/102  Pulse: 92 64 72 60  Resp: 18 16 17 16   Temp: 98.2 F (36.8 C) 98.1 F (36.7 C) 98.2 F (36.8 C) 97.7 F (36.5 C)  TempSrc: Oral Oral Oral Oral  SpO2: 100% 100% 92%   Weight:   92.7 kg   Height:       Filed Weights   01/31/20 0748 02/01/20 0449 02/07/20 0509  Weight: 88.5 kg 88.7 kg 92.7 kg   Body mass index is 30.18 kg/m.   Gen Exam:Alert awake-not in any distress HEENT:atraumatic, normocephalic Chest: B/L clear to auscultation anteriorly CVS:S1S2 regular Abdomen:soft non tender, non distended Extremities:no edema Neurology: Non focal Skin: no rash  I have personally reviewed following labs and imaging studies  LABORATORY DATA: CBC: Recent Labs  Lab 02/03/20 0431 02/04/20 0526 02/05/20 0347 02/06/20 0210 02/07/20 0257  WBC 3.5* 3.0* 3.1* 3.3* 3.8*  HGB 9.4* 9.5* 8.4* 8.1* 8.9*  HCT 30.0* 29.8* 26.4* 25.5* 27.9*  MCV 84.0 83.9 83.3 83.1 84.5  PLT 183 158 177 [id number]    Basic Metabolic Panel: Recent Labs  Lab 01/31/20 2335 02/02/20 0222 02/04/20 0220 02/06/20 0210 02/07/20 0257  NA 138 138  --  135 137    K 3.9 3.7  --  4.2 3.8  CL 104 105  --  101 101  CO2 25 25  --  24 26  GLUCOSE 107* 112*  --  109* 116*  BUN 10 8  --  8 6  CREATININE 0.83 0.69  --  0.74 0.75  CALCIUM 8.7* 8.7*  --  8.6* 9.0  MG 1.6* 1.5* 1.5* 1.6* 1.7  PHOS 3.9 3.5  --   --   --     GFR: Estimated Creatinine Clearance: 110.4 mL/min (by C-G formula based on SCr of 0.75 mg/dL).  Liver Function Tests: Recent Labs  Lab 01/31/20 2335 02/02/20 0222  ALBUMIN 2.9* 2.7*   No results for input(s): LIPASE, AMYLASE in the last 168 hours. No results for input(s): AMMONIA in the last 168 hours.  Coagulation Profile: Recent Labs  Lab 02/03/20 0431 02/04/20 0526 02/05/20 0347 02/06/20 0210 02/07/20 0257  INR 1.3* 1.3* 1.4* 1.5* 1.6*    Cardiac Enzymes: No results for input(s): CKTOTAL, CKMB, CKMBINDEX, TROPONINI in the last 168 hours.  BNP (last  3 results) No results for input(s): PROBNP in the last 8760 hours.  Lipid Profile: Recent Labs    02/05/20 1648  CHOL 164  HDL 50  LDLCALC 100*  TRIG 71  CHOLHDL 3.3    Thyroid Function Tests: No results for input(s): TSH, T4TOTAL, FREET4, T3FREE, THYROIDAB in the last 72 hours.  Anemia Panel: No results for input(s): VITAMINB12, FOLATE, FERRITIN, TIBC, IRON, RETICCTPCT in the last 72 hours.  Urine analysis:    Component Value Date/Time   COLORURINE YELLOW 01/27/2020 1841   APPEARANCEUR CLEAR 01/27/2020 1841   APPEARANCEUR Clear 08/03/2019 1143   LABSPEC 1.015 01/27/2020 1841   PHURINE 6.0 01/27/2020 1841   GLUCOSEU NEGATIVE 01/27/2020 1841   HGBUR SMALL (A) 01/27/2020 1841   BILIRUBINUR NEGATIVE 01/27/2020 1841   BILIRUBINUR small (A) 08/03/2019 1144   BILIRUBINUR Negative 08/03/2019 1143   KETONESUR 5 (A) 01/27/2020 1841   PROTEINUR 30 (A) 01/27/2020 1841   UROBILINOGEN 1.0 08/03/2019 1144   UROBILINOGEN 1.0 01/25/2013 0825   NITRITE NEGATIVE 01/27/2020 1841   LEUKOCYTESUR NEGATIVE 01/27/2020 1841    Sepsis Labs: Lactic Acid, Venous     Component Value Date/Time   LATICACIDVEN 1.2 01/28/2020 0240    MICROBIOLOGY: No results found for this or any previous visit (from the past 240 hour(s)).  RADIOLOGY STUDIES/RESULTS: No results found.   LOS: 11 days   Oren Binet, MD  [facility]    To contact the attending provider between 7A-7P or the covering provider during after hours 7P-7A, please log into the web site [email or web address] and access using universal [place] password for that web site. If you do not have the password, please call the hospital operator.  02/07/2020, 2:20 PM

## 2020-02-07 NOTE — Progress Notes (Signed)
Physical Therapy Treatment Patient Details Name: Leonard Bentley MRN: [id number] DOB: [birth year] Today's Date: 02/07/2020    History of Present Illness 59 y.o. male with what a recent left foot first and second ray amputation for osteomyelitis and discharged on March 10/27/2020 with history of minimally invasive mitral valve repair, chronic combined systolic and diastolic CHF last EF is improved to 55 to 60%, alcohol abuse, non-Hodgkin's lymphoma stage II in remission presents to the ER with complaint of increasing pain and discharge from the left foot. following L foot first and 2nd ray amputation.   X-rays show fracture involving the left third metatarsal concerning for osteomyelitis. Admitted 01/27/20 for amputation revision sx and s/p 3rd ray amputation on 01/30/20.    PT Comments    Pt eager to ambulate.  Smooth "swing to " pattern and maneuvered well in a confined space.     Follow Up Recommendations  SNF;Other (comment)(until pt can pt weight on foot can not be at home.)     Equipment Recommendations       Recommendations for Other Services       Precautions / Restrictions Precautions Precautions: Fall Precaution Comments: wound VAC L foot, watch HR Required Braces or Orthoses: Other Brace Other Brace: post-op shoe Restrictions LLE Weight Bearing: Non weight bearing Other Position/Activity Restrictions: post op shoe    Mobility  Bed Mobility Overal bed mobility: Modified Independent                Transfers Overall transfer level: Needs assistance Equipment used: Rolling walker (2 wheeled) Transfers: Sit to/from [facility] Sit to Stand: Supervision Stand pivot transfers: Supervision       General transfer comment: good transfer technique  Ambulation/Gait Ambulation/Gait assistance: Min guard Gait Distance (Feet): 50 Feet Assistive device: Rolling walker (2 wheeled) Gait Pattern/deviations: Step-to pattern Gait velocity: decr Gait velocity  interpretation: <1.31 ft/sec, indicative of household ambulator General Gait Details: smooth "swing to " pattern even in tight spaces.   Stairs             Wheelchair Mobility    Modified Rankin (Stroke Patients Only)       Balance Overall balance assessment: Needs assistance Sitting-balance support: Feet supported Sitting balance-Leahy Scale: Good     Standing balance support: Bilateral upper extremity supported;During functional activity Standing balance-Leahy Scale: Fair Standing balance comment: safest when using at least 1 hand/UE                            Cognition Arousal/Alertness: Awake/alert Behavior During Therapy: WFL for tasks assessed/performed Overall Cognitive Status: Difficult to assess(pt follow direction well)                                        Exercises      General Comments        Pertinent Vitals/Pain Faces Pain Scale: Hurts a little bit Pain Location: L foot Pain Descriptors / Indicators: Aching;Sore;Throbbing Pain Intervention(s): Monitored during session    Home Living                      Prior Function            PT Goals (current goals can now be found in the care plan section) Acute Rehab PT Goals Patient Stated Goal: to be able to walker further, to  improve PT Goal Formulation: With patient Time For Goal Achievement: 02/12/20 Potential to Achieve Goals: Fair Progress towards PT goals: Progressing toward goals    Frequency    Min 3X/week      PT Plan Current plan remains appropriate    Co-evaluation              AM-PAC PT "6 Clicks" Mobility   Outcome Measure  Help needed turning from your back to your side while in a flat bed without using bedrails?: None Help needed moving from lying on your back to sitting on the side of a flat bed without using bedrails?: None Help needed moving to and from a bed to a chair (including a wheelchair)?: None Help needed  standing up from a chair using your arms (e.g., wheelchair or bedside chair)?: None Help needed to walk in hospital room?: A Little Help needed climbing 3-5 steps with a railing? : Total 6 Click Score: 20    End of Session   Activity Tolerance: Patient tolerated treatment well Patient left: in chair;with call bell/phone within reach;Other (comment)(set up at sink to wash up.) Nurse Communication: Mobility status PT Visit Diagnosis: Other abnormalities of gait and mobility (R26.89) Pain - Right/Left: Left Pain - part of body: (forefoot)     Time: [id number] PT Time Calculation (min) (ACUTE ONLY): 13 min  Charges:  $Gait Training: 8-22 mins                     02/07/2020  Leonard Carne., PT Acute Rehabilitation Services [phone]  (pager) [phone]  (office)   Leonard Bentley 02/07/2020, 5:49 PM

## 2020-02-08 LAB — CBC
HCT: 29.5 % — ABNORMAL LOW (ref 39.0–52.0)
Hemoglobin: 9.2 g/dL — ABNORMAL LOW (ref 13.0–17.0)
MCH: 26.3 pg (ref 26.0–34.0)
MCHC: 31.2 g/dL (ref 30.0–36.0)
MCV: 84.3 fL (ref 80.0–100.0)
Platelets: 243 10*3/uL (ref 150–400)
RBC: 3.5 MIL/uL — ABNORMAL LOW (ref 4.22–5.81)
RDW: 15.3 % (ref 11.5–15.5)
WBC: 3.2 10*3/uL — ABNORMAL LOW (ref 4.0–10.5)
nRBC: 0 % (ref 0.0–0.2)

## 2020-02-08 LAB — BASIC METABOLIC PANEL
Anion gap: 11 (ref 5–15)
BUN: 6 mg/dL (ref 6–20)
CO2: 26 mmol/L (ref 22–32)
Calcium: 9.3 mg/dL (ref 8.9–10.3)
Chloride: 100 mmol/L (ref 98–111)
Creatinine, Ser: 0.96 mg/dL (ref 0.61–1.24)
GFR calc Af Amer: >60 mL/min (ref >60)
GFR calc non Af Amer: >60 mL/min (ref >60)
Glucose, Bld: 103 mg/dL — ABNORMAL HIGH (ref 70–99)
Potassium: 4 mmol/L (ref 3.5–5.1)
Sodium: 137 mmol/L (ref 135–145)

## 2020-02-08 LAB — PROTIME-INR
INR: 1.6 — ABNORMAL HIGH (ref 0.8–1.2)
Prothrombin Time: 19.2 seconds — ABNORMAL HIGH (ref 11.4–15.2)

## 2020-02-08 LAB — HEPARIN LEVEL (UNFRACTIONATED): Heparin Unfractionated: 0.51 IU/mL (ref 0.30–0.70)

## 2020-02-08 LAB — MAGNESIUM: Magnesium: 1.9 mg/dL (ref 1.7–2.4)

## 2020-02-08 MED ORDER — WARFARIN SODIUM 7.5 MG PO TABS
12.5000 mg | ORAL_TABLET | Freq: Once | ORAL | Status: AC
  Administered 2020-02-08: 12.5 mg via ORAL
  Filled 2020-02-08 (×2): qty 1

## 2020-02-08 NOTE — Progress Notes (Signed)
ANTICOAGULATION CONSULT NOTE - Follow Up Consult  Pharmacy Consult for Heparin and Warfarin Indication: hx recurrent PE  Allergies  Allergen Reactions  . Other Other (See Comments)    "Cactus" caused blisters on tongue (if prepared as food)    Patient Measurements: Height: 5\' 9"  (175.3 cm)(office visit 01/23/20) Weight: 93.9 kg (207 lb 0.2 oz) IBW/kg (Calculated) : 70.7 Heparin Dosing Weight: 89 kg  Vital Signs: Temp: 98.1 F (36.7 C) (04/02 0547) Temp Source: Oral (04/02 0547) BP: 121/108 (04/02 0547) Pulse Rate: 73 (04/02 0547)  Labs: Recent Labs    02/06/20 0210 02/06/20 0210 02/07/20 0257 02/08/20 0324  HGB 8.1*   < > 8.9* 9.2*  HCT 25.5*  --  27.9* 29.5*  PLT 192  --  215 243  LABPROT 17.7*  --  19.2* 19.2*  INR 1.5*  --  1.6* 1.6*  HEPARINUNFRC 0.45  --  0.59 0.51  CREATININE 0.74  --  0.75 0.96   < > = values in this interval not displayed.    Estimated Creatinine Clearance: 92.6 mL/min (by C-G formula based on SCr of 0.96 mg/dL).  Assessment:   58 yr old male on Warfarin PTA for hx recurrent PE (04/2016).  INR supratherapeutic (3.8) on admit 3/21.  Warfarin held and Vitamin K 2.5 mg IV given on 3/22 for 3rd ray amputation on 3/24.  Warfarin resumed on 3/25 with Heparin bridge.   Heparin held on 3/29 am after he bumped foot and had copious bleeding. Resumed that afternoon after bleeding has decreased. No further bleeding reported.    Heparin level remains therapeutic (0.51) on 1350 units/hr.   INR 1.6 again today. Slow to increase. Has had Warfarin 10 mg x 2 days. CBC stable.    PTA warfarin: 5 mg daily.  Goal of Therapy:  INR 2-3 Heparin level 0.3-0.7 units/ml Monitor platelets by anticoagulation protocol: Yes   Plan:   Continue heparin drip at 1350 units/hr  Increase Warfarin to 12.5 mg x 1 today.  Daily heparin level, PT/INR and CBC.   Arty Baumgartner, [place] Phone: [phone] 02/08/2020,10:45 AM

## 2020-02-08 NOTE — Progress Notes (Signed)
Physical Therapy Treatment Patient Details Name: Leonard Bentley MRN: [id number] DOB: Jul 28, 1961 Today's Date: 02/08/2020    History of Present Illness 59 y.o. male with what a recent left foot first and second ray amputation for osteomyelitis and discharged on March 10/27/2020 with history of minimally invasive mitral valve repair, chronic combined systolic and diastolic CHF last EF is improved to 55 to 60%, alcohol abuse, non-Hodgkin's lymphoma stage II in remission presents to the ER with complaint of increasing pain and discharge from the left foot. following L foot first and 2nd ray amputation.   X-rays show fracture involving the left third metatarsal concerning for osteomyelitis. Admitted 01/27/20 for amputation revision sx and s/p 3rd ray amputation on 01/30/20.    PT Comments    Pt demonstrating increased gait distance, but continues to need cues for safety and deficit awareness.  Pt often wanting to ambulate too far from room and fatigues requiring multiple rest breaks.  Even when fatigued, pt asking to go further.  Educated on safety, rest breaks, shorter more frequent walks with staff.  Pt is safe to perform stand pivot transfers in room but do not advise ambulation independently.  Pt is not consistent about waiting for help - recommend chair alarm.   Pt reports doing his exercises in the bed and chair.  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  Added T band exercises.    Follow Up Recommendations  SNF;Other (comment)(not safe to be at home while NWB)     Equipment Recommendations  Wheelchair cushion (measurements PT);Wheelchair (measurements PT)    Recommendations for Other Services       Precautions / Restrictions Precautions Precautions: Fall Precaution Comments: watvch HR Other Brace: post-op shoe Restrictions LLE Weight Bearing: Non weight bearing    Mobility  Bed Mobility   Bed Mobility: Supine to Sit;Sit to Supine     Supine  to sit: Supervision Sit to supine: Supervision      Transfers Overall transfer level: Needs assistance Equipment used: Rolling walker (2 wheeled) Transfers: Sit to/from Stand Sit to Stand: Supervision Stand pivot transfers: Supervision       General transfer comment: sit to stand x 3  Ambulation/Gait Ambulation/Gait assistance: Min guard Gait Distance (Feet): 100 Feet Assistive device: Rolling walker (2 wheeled)   Gait velocity: decr   General Gait Details: Pt able to hop on R LE and maintain NWB;  attempted to have pt turn around after 30' but he wanted to try more - pt fatigued and required 4 standing rest breaks prior to getting back to room  Pt with DOE of 4/4 but VSS   Stairs             Wheelchair Mobility    Modified Rankin (Stroke Patients Only)       Balance Overall balance assessment: Needs assistance Sitting-balance support: Feet supported Sitting balance-Leahy Scale: Normal     Standing balance support: Bilateral upper extremity supported;During functional activity Standing balance-Leahy Scale: Fair                              Cognition Arousal/Alertness: Awake/alert Behavior During Therapy: WFL for tasks assessed/performed   Area of Impairment: Safety/judgement;Awareness                         Safety/Judgement: Decreased awareness of deficits;Decreased awareness of safety     General Comments:  Pt required cues for pacing and safety.  Pt wanting to push endurance, but needs cues to be aware of deficits and fatigue.      Exercises General Exercises - Lower Extremity Ankle Circles/Pumps: AROM;Both;10 reps Long Arc Quad: AROM;10 reps;Both;Seated Hip Flexion/Marching: AROM;Both;10 reps;Seated Other Exercises Other Exercises: Hip ADD pillow squeeze, hip abd clamshell with blue TBand, tricep chair push up  x 20    General Comments General comments (skin integrity, edema, etc.):  Decreased safety awareness: Pt  reports ambulated to bathroom earlier independently (reports nursing aware)- PT advised against this for safety.  Left pt in chair at sink where he was performing ADLs, he stated he would call out for assist.  While typing note heard pt up in room before nurse tech walked into room - advised use of alarm and pt having supervision.  Educated on safety, use of rest breaks, gradual progression, calling for assistance, and elevation of leg and HEP      Pertinent Vitals/Pain Pain Assessment: No/denies pain    Home Living                      Prior Function            PT Goals (current goals can now be found in the care plan section) Progress towards PT goals: Progressing toward goals    Frequency    Min 3X/week      PT Plan Current plan remains appropriate    Co-evaluation              AM-PAC PT "6 Clicks" Mobility   Outcome Measure  Help needed turning from your back to your side while in a flat bed without using bedrails?: None Help needed moving from lying on your back to sitting on the side of a flat bed without using bedrails?: None Help needed moving to and from a bed to a chair (including a wheelchair)?: None Help needed standing up from a chair using your arms (e.g., wheelchair or bedside chair)?: None Help needed to walk in hospital room?: A Little Help needed climbing 3-5 steps with a railing? : A Lot 6 Click Score: 21    End of Session Equipment Utilized During Treatment: Gait belt Activity Tolerance: Patient tolerated treatment well Patient left: Other (comment);in chair;with call bell/phone within reach(set up at sink to wash up) Nurse Communication: Mobility status(recommendation for pt to have supervision ; may need alarms turned on as he is not calling out) PT Visit Diagnosis: Other abnormalities of gait and mobility (R26.89)     Time: [id number] PT Time Calculation (min) (ACUTE ONLY): 32 min  Charges:  $Gait Training: 8-22  mins $Therapeutic Exercise: 8-22 mins                     Maggie Font, PT Acute Rehab Services Pager [phone] [facility] [place] 02/08/2020, 3:47 PM

## 2020-02-08 NOTE — TOC Progression Note (Signed)
Transition of Care [facility]) - Progression Note    Patient Details  Name: Leonard Bentley MRN: [id number] Date of Birth: May 05, 1961  Transition of Care [facility]) CM/SW [place], [place] Phone Number: 02/08/2020, 8:48 AM  Clinical Narrative:    Patient continues to not have any SNF bed offers due to lack of insurance. CSW to continue to follow.      Barriers to Discharge: Continued Medical Work up, SNF Pending bed offer, SNF Pending payor source - LOG  Expected Discharge Plan and Services   In-house Referral: Clinical Social Work, Risk manager Acute Care Choice: [place] Living arrangements for the past 2 months: [place] Determinants of Health (SDOH) Interventions    Readmission Risk Interventions Readmission Risk Prevention Plan 01/31/2020 01/16/2020 01/18/2019  Transportation Screening Complete Complete Complete  PCP or Specialist Appt within 5-7 Days Complete Complete -  Home Care Screening Complete Complete -  Medication Review (RN CM) Complete Complete -  Medication Review ([place]) - - Complete  [place]RI or [place] - - Complete  [place] - - Not Applicable  Some recent data might be hidden

## 2020-02-08 NOTE — Progress Notes (Signed)
PROGRESS NOTE        Leonard Bentley DETAILS Name: Leonard Bentley Age: 59 y.o. Sex: male Date of Birth: [birth year] Admit Date: 01/27/2020 Admitting Physician [name], MD [id number], Vernia Buff, NP  Brief Narrative: Leonard Bentley is a 59 y.o. male with history of chronic diastolic heart failure, severe MR s/p mitral valve repair, recurrent PE on Coumadin, non-Hodgkin's lymphoma, alcohol abuse recent hospitalization from 3/5-3/12 osteomyelitis-requiring left first and second ray amputation who presented with wound dehiscence (noncompliant to weightbearing status), drainage and newly displaced fracture of the third metatarsal head with underlying osteomyelitis.  Leonard Bentley was evaluated by orthopedics and underwent left third ray amputation.  Significant events: 3/5-3/12>> admission for left foot osteomyelitis-s/p left first and second ray amputation 3/21>> admit for left foot wound dehiscence-third metatarsal head displaced fracture with possible underlying osteomyelitis   Antimicrobial therapy: Ceftriaxone: 3/21>> 3/24 Vancomycin: 3/21>> 3/25  Microbiology data: 3/21>> blood culture: Negative  Procedures : 3/24>> left third ray amputation  Consults: Orthopedics  DVT Prophylaxis : Full dose anticoagulation with Heparin/Coumadin  Subjective: No major issues overnight-denies any chest pain or shortness of breath.  Awaiting SNF bed.  Assessment/Plan: Left foot wound dehiscence with left third metatarsal fracture with possible underlying osteomyelitis-s/p left third ray amputation: Orthopedics following-continue supportive care.  Per orthopedics-needs to be strict nonweightbearing on the left foot.  Anemia: No evidence of blood loss-I suspect this is secondary to inflammation related to chronic osteomyelitis/wound infection.  Follow for now.  Chronic diastolic heart failure: Compensated-follow volume status closely  History of severe MR-s/p mitral valve repair  with annular ring prosthesis 2017: Stable-outpatient follow-up with cardiology  History of recurrent pulmonary embolism: On overlapping heparin/Coumadin-pharmacy managing-INR still subtherapeutic  History of SVT/PACs: Stable-on beta-blocker.  History of tobacco use: Continue transdermal nicotine  History of non-Hodgkin's lymphoma in remission: Follow-up with oncology in the outpatient setting  Diet: Diet Order            Diet Carb Modified Fluid consistency: Thin; Room service appropriate? Yes  Diet effective now              Code Status: Full code  Family Communication: Leonard Bentley to update family-he is awake and alert.  Disposition Plan:  SNF when ready for discharge  Barriers to Discharge: Awaiting SNF bed  Antimicrobial agents: Anti-infectives (From admission, onward)   Start     Dose/Rate Route Frequency Ordered Stop   01/30/20 0830  ceFAZolin (ANCEF) IVPB 2g/100 mL premix  Status:  Discontinued     2 g 200 mL/hr over 30 Minutes Intravenous To Short Stay 01/30/20 0824 01/30/20 1330   01/28/20 1800  cefTRIAXone (ROCEPHIN) 2 g in sodium chloride 0.9 % 100 mL IVPB     2 g 200 mL/hr over 30 Minutes Intravenous Every 24 hours 01/27/20 2211 01/30/20 1900   01/28/20 0600  vancomycin (VANCOCIN) IVPB 1000 mg/200 mL premix     1,000 mg 200 mL/hr over 60 Minutes Intravenous Every 12 hours 01/27/20 1738 01/31/20 1006   01/27/20 1730  vancomycin (VANCOCIN) IVPB 1000 mg/200 mL premix  Status:  Discontinued     1,000 mg 200 mL/hr over 60 Minutes Intravenous  Once 01/27/20 1725 01/27/20 1726   01/27/20 1730  cefTRIAXone (ROCEPHIN) 2 g in sodium chloride 0.9 % 100 mL IVPB     2 g 200 mL/hr over 30 Minutes Intravenous  Once  01/27/20 1725 01/27/20 2010   01/27/20 1730  vancomycin (VANCOREADY) IVPB 2000 mg/400 mL     2,000 mg 200 mL/hr over 120 Minutes Intravenous  Once 01/27/20 1726 01/27/20 2219       Time spent: 15- minutes-Greater than 50% of this time was spent in  counseling, explanation of diagnosis, planning of further management, and coordination of care.  MEDICATIONS: Scheduled Meds: . docusate sodium  100 mg Oral BID  . folic acid  1 mg Oral Daily  . metoprolol tartrate  25 mg Oral BID  . multivitamin with minerals  1 tablet Oral Daily  . nicotine  14 mg Transdermal Daily  . thiamine  100 mg Oral Daily  . warfarin  12.5 mg Oral ONCE-1600  . Warfarin - Pharmacist Dosing Inpatient   Does not apply q1800   Continuous Infusions: . heparin 1,350 Units/hr (02/08/20 0644)  . lactated ringers 10 mL/hr at 02/04/20 1304   PRN Meds:.acetaminophen **OR** acetaminophen, metoprolol tartrate, morphine injection, ondansetron **OR** ondansetron (ZOFRAN) IV, oxyCODONE   PHYSICAL EXAM: Vital signs: Vitals:   02/07/20 2118 02/08/20 0500 02/08/20 0547 02/08/20 1209  BP: (!) 134/109  (!) 121/108 (!) 124/102  Pulse: 80  73   Resp: 16  20 18   Temp: 98.4 F (36.9 C)  98.1 F (36.7 C) 98.3 F (36.8 C)  TempSrc: Oral  Oral Oral  SpO2: 100%  99%   Weight:  93.9 kg    Height:       Filed Weights   02/01/20 0449 02/07/20 0509 02/08/20 0500  Weight: 88.7 kg 92.7 kg 93.9 kg   Body mass index is 30.57 kg/m.   Gen Exam:Alert awake-not in any distress HEENT:atraumatic, normocephalic Chest: B/L clear to auscultation anteriorly CVS:S1S2 regular Abdomen:soft non tender, non distended Extremities:no edema Neurology: Non focal Skin: no rash  I have personally reviewed following labs and imaging studies  LABORATORY DATA: CBC: Recent Labs  Lab 02/04/20 0526 02/05/20 0347 02/06/20 0210 02/07/20 0257 02/08/20 0324  WBC 3.0* 3.1* 3.3* 3.8* 3.2*  HGB 9.5* 8.4* 8.1* 8.9* 9.2*  HCT 29.8* 26.4* 25.5* 27.9* 29.5*  MCV 83.9 83.3 83.1 84.5 84.3  PLT 158 177 192 215 0000000    Basic Metabolic Panel: Recent Labs  Lab 02/02/20 0222 02/04/20 0220 02/06/20 0210 02/07/20 0257 02/08/20 0324  NA 138  --  135 137 137  K 3.7  --  4.2 3.8 4.0  CL 105  --   101 101 100  CO2 25  --  24 26 26   GLUCOSE 112*  --  109* 116* 103*  BUN 8  --  8 6 6   CREATININE 0.69  --  0.74 0.75 0.96  CALCIUM 8.7*  --  8.6* 9.0 9.3  MG 1.5* 1.5* 1.6* 1.7 1.9  PHOS 3.5  --   --   --   --     GFR: Estimated Creatinine Clearance: 92.6 mL/min (by C-G formula based on SCr of 0.96 mg/dL).  Liver Function Tests: Recent Labs  Lab 02/02/20 0222  ALBUMIN 2.7*   No results for input(s): LIPASE, AMYLASE in the last 168 hours. No results for input(s): AMMONIA in the last 168 hours.  Coagulation Profile: Recent Labs  Lab 02/04/20 0526 02/05/20 0347 02/06/20 0210 02/07/20 0257 02/08/20 0324  INR 1.3* 1.4* 1.5* 1.6* 1.6*    Cardiac Enzymes: No results for input(s): CKTOTAL, CKMB, CKMBINDEX, TROPONINI in the last 168 hours.  BNP (last 3 results) No results for input(s): PROBNP in the last  8760 hours.  Lipid Profile: Recent Labs    02/05/20 1648  CHOL 164  HDL 50  LDLCALC 100*  TRIG 71  CHOLHDL 3.3    Thyroid Function Tests: No results for input(s): TSH, T4TOTAL, FREET4, T3FREE, THYROIDAB in the last 72 hours.  Anemia Panel: No results for input(s): VITAMINB12, FOLATE, FERRITIN, TIBC, IRON, RETICCTPCT in the last 72 hours.  Urine analysis:    Component Value Date/Time   COLORURINE YELLOW 01/27/2020 1841   APPEARANCEUR CLEAR 01/27/2020 1841   APPEARANCEUR Clear 08/03/2019 1143   LABSPEC 1.015 01/27/2020 1841   PHURINE 6.0 01/27/2020 1841   GLUCOSEU NEGATIVE 01/27/2020 1841   HGBUR SMALL (A) 01/27/2020 1841   BILIRUBINUR NEGATIVE 01/27/2020 1841   BILIRUBINUR small (A) 08/03/2019 1144   BILIRUBINUR Negative 08/03/2019 1143   KETONESUR 5 (A) 01/27/2020 1841   PROTEINUR 30 (A) 01/27/2020 1841   UROBILINOGEN 1.0 08/03/2019 1144   UROBILINOGEN 1.0 01/25/2013 0825   NITRITE NEGATIVE 01/27/2020 1841   LEUKOCYTESUR NEGATIVE 01/27/2020 1841    Sepsis Labs: Lactic Acid, Venous    Component Value Date/Time   LATICACIDVEN 1.2 01/28/2020 0240      MICROBIOLOGY: No results found for this or any previous visit (from the past 240 hour(s)).  RADIOLOGY STUDIES/RESULTS: No results found.   LOS: 12 days   Oren Binet, MD  [facility]    To contact the attending provider between 7A-7P or the covering provider during after hours 7P-7A, please log into the web site [email or web address] and access using universal [place] password for that web site. If you do not have the password, please call the hospital operator.  02/08/2020, 12:16 PM

## 2020-02-08 NOTE — Plan of Care (Signed)
  Problem: Clinical Measurements: Goal: Will remain free from infection Outcome: Progressing Goal: Respiratory complications will improve Outcome: Progressing   Problem: Safety: Goal: Ability to remain free from injury will improve Outcome: Progressing   Problem: Skin Integrity: Goal: Risk for impaired skin integrity will decrease Outcome: Progressing   

## 2020-02-09 LAB — CBC
HCT: 26.7 % — ABNORMAL LOW (ref 39.0–52.0)
Hemoglobin: 8.4 g/dL — ABNORMAL LOW (ref 13.0–17.0)
MCH: 26.3 pg (ref 26.0–34.0)
MCHC: 31.5 g/dL (ref 30.0–36.0)
MCV: 83.7 fL (ref 80.0–100.0)
Platelets: 230 10*3/uL (ref 150–400)
RBC: 3.19 MIL/uL — ABNORMAL LOW (ref 4.22–5.81)
RDW: 15.3 % (ref 11.5–15.5)
WBC: 3.1 10*3/uL — ABNORMAL LOW (ref 4.0–10.5)
nRBC: 0 % (ref 0.0–0.2)

## 2020-02-09 LAB — BASIC METABOLIC PANEL
Anion gap: 10 (ref 5–15)
BUN: 6 mg/dL (ref 6–20)
CO2: 26 mmol/L (ref 22–32)
Calcium: 9.2 mg/dL (ref 8.9–10.3)
Chloride: 102 mmol/L (ref 98–111)
Creatinine, Ser: 0.75 mg/dL (ref 0.61–1.24)
GFR calc Af Amer: >60 mL/min (ref >60)
GFR calc non Af Amer: >60 mL/min (ref >60)
Glucose, Bld: 113 mg/dL — ABNORMAL HIGH (ref 70–99)
Potassium: 3.7 mmol/L (ref 3.5–5.1)
Sodium: 138 mmol/L (ref 135–145)

## 2020-02-09 LAB — HEPARIN LEVEL (UNFRACTIONATED): Heparin Unfractionated: 0.45 IU/mL (ref 0.30–0.70)

## 2020-02-09 LAB — MAGNESIUM: Magnesium: 1.5 mg/dL — ABNORMAL LOW (ref 1.7–2.4)

## 2020-02-09 LAB — PROTIME-INR
INR: 1.7 — ABNORMAL HIGH (ref 0.8–1.2)
Prothrombin Time: 19.5 seconds — ABNORMAL HIGH (ref 11.4–15.2)

## 2020-02-09 MED ORDER — ALPRAZOLAM 0.25 MG PO TABS
0.2500 mg | ORAL_TABLET | Freq: Three times a day (TID) | ORAL | Status: DC | PRN
  Administered 2020-02-09 – 2020-02-25 (×30): 0.25 mg via ORAL
  Filled 2020-02-09 (×31): qty 1

## 2020-02-09 MED ORDER — WARFARIN SODIUM 7.5 MG PO TABS
12.5000 mg | ORAL_TABLET | Freq: Once | ORAL | Status: AC
  Administered 2020-02-09: 12.5 mg via ORAL
  Filled 2020-02-09: qty 1

## 2020-02-09 MED ORDER — MAGNESIUM SULFATE 2 GM/50ML IV SOLN: 2.0000 g | Freq: Once | INTRAVENOUS | Status: DC

## 2020-02-09 MED ORDER — MAGNESIUM SULFATE 4 GM/100ML IV SOLN
4.0000 g | Freq: Once | INTRAVENOUS | Status: AC
  Administered 2020-02-09: 4 g via INTRAVENOUS
  Filled 2020-02-09: qty 100

## 2020-02-09 NOTE — Progress Notes (Signed)
ANTICOAGULATION CONSULT NOTE - Follow Up Consult  Pharmacy Consult for Heparin and Warfarin Indication: hx recurrent PE  Allergies  Allergen Reactions  . Other Other (See Comments)    "Cactus" caused blisters on tongue (if prepared as food)    Patient Measurements: Height: 5\' 9"  (175.3 cm)(office visit 01/23/20) Weight: 89 kg (196 lb 3.4 oz) IBW/kg (Calculated) : 70.7 Heparin Dosing Weight: 89 kg  Vital Signs: Temp: 97.9 F (36.6 C) (04/03 0543) Temp Source: Oral (04/03 0543) BP: 132/98 (04/03 0543) Pulse Rate: 62 (04/03 0543)  Labs: Recent Labs    02/07/20 0257 02/07/20 0257 02/08/20 0324 02/09/20 0437  HGB 8.9*   < > 9.2* 8.4*  HCT 27.9*  --  29.5* 26.7*  PLT 215  --  243 230  LABPROT 19.2*  --  19.2* 19.5*  INR 1.6*  --  1.6* 1.7*  HEPARINUNFRC 0.59  --  0.51 0.45  CREATININE 0.75  --  0.96 0.75   < > = values in this interval not displayed.    Estimated Creatinine Clearance: 108.3 mL/min (by C-G formula based on SCr of 0.75 mg/dL).  Assessment: 59 yr old male on Warfarin PTA for hx recurrent PE (04/2016).  INR supratherapeutic (3.8) on admit 3/21.  Warfarin held and Vitamin K 2.5 mg IV given on 3/22 for 3rd ray amputation on 3/24.  Warfarin resumed on 3/25 with Heparin bridge. Heparin held on 3/29 am after he bumped foot and had copious bleeding. Resumed that afternoon after bleeding has decreased. No further bleeding reported.    Heparin level remains therapeutic (0.45) on 1350 units/hr.   INR up slightly to 1.7 today. Slow to increase. CBC stable.     PTA warfarin: 5 mg daily.  Goal of Therapy:  INR 2-3 Heparin level 0.3-0.7 units/ml Monitor platelets by anticoagulation protocol: Yes   Plan:   Continue heparin drip at 1350 units/hr  Give Warfarin 12.5 mg x 1 today.  Daily heparin level, PT/INR and CBC.  Agnes Lawrence, PharmD PGY1 Pharmacy Resident

## 2020-02-09 NOTE — Progress Notes (Signed)
PROGRESS NOTE        PATIENT DETAILS Name: Leonard Bentley Age: 59 y.o. Sex: male Date of Birth: 21-Mar-1961 Admit Date: 01/27/2020 Admitting Physician [name], MD IW:[id number], Vernia Buff, NP  Brief Narrative: Patient is a 59 y.o. male with history of chronic diastolic heart failure, severe MR s/p mitral valve repair, recurrent PE on Coumadin, non-Hodgkin's lymphoma, alcohol abuse recent hospitalization from 3/5-3/12 osteomyelitis-requiring left first and second ray amputation who presented with wound dehiscence (noncompliant to weightbearing status), drainage and newly displaced fracture of the third metatarsal head with underlying osteomyelitis.  Patient was evaluated by orthopedics and underwent left third ray amputation.  Significant events: 3/5-3/12>> admission for left foot osteomyelitis-s/p left first and second ray amputation 3/21>> admit for left foot wound dehiscence-third metatarsal head displaced fracture with possible underlying osteomyelitis   Antimicrobial therapy: Ceftriaxone: 3/21>> 3/24 Vancomycin: 3/21>> 3/25  Microbiology data: 3/21>> blood culture: Negative  Procedures : 3/24>> left third ray amputation  Consults: Orthopedics  DVT Prophylaxis : Full dose anticoagulation with Heparin/Coumadin  Subjective: Asking for an anxiety pill.  No other complaints.  Not short of breath-no chest pain.  Assessment/Plan: Left foot wound dehiscence with left third metatarsal fracture with possible underlying osteomyelitis-s/p left third ray amputation: Orthopedics following-continue supportive care.  Per orthopedics-needs to be strict nonweightbearing on the left foot.  Anemia: No evidence of blood loss-I suspect this is secondary to inflammation related to chronic osteomyelitis/wound infection.  Follow for now.  Chronic diastolic heart failure: Compensated-follow volume status closely  History of severe MR-s/p mitral valve repair with  annular ring prosthesis 2017: Stable-outpatient follow-up with cardiology  History of recurrent pulmonary embolism: On overlapping heparin/Coumadin-pharmacy managing-INR still subtherapeutic  History of SVT/PACs: Stable-on beta-blocker.  History of tobacco use: Continue transdermal nicotine  History of non-Hodgkin's lymphoma in remission: Follow-up with oncology in the outpatient setting  Diet: Diet Order            Diet Carb Modified Fluid consistency: Thin; Room service appropriate? Yes  Diet effective now              Code Status: Full code  Family Communication: Patient to update family-he is awake and alert.  Disposition Plan:  SNF when ready for discharge  Barriers to Discharge: Awaiting SNF bed  Antimicrobial agents: Anti-infectives (From admission, onward)   Start     Dose/Rate Route Frequency Ordered Stop   01/30/20 0830  ceFAZolin (ANCEF) IVPB 2g/100 mL premix  Status:  Discontinued     2 g 200 mL/hr over 30 Minutes Intravenous To Short Stay 01/30/20 0824 01/30/20 1330   01/28/20 1800  cefTRIAXone (ROCEPHIN) 2 g in sodium chloride 0.9 % 100 mL IVPB     2 g 200 mL/hr over 30 Minutes Intravenous Every 24 hours 01/27/20 2211 01/30/20 1900   01/28/20 0600  vancomycin (VANCOCIN) IVPB 1000 mg/200 mL premix     1,000 mg 200 mL/hr over 60 Minutes Intravenous Every 12 hours 01/27/20 1738 01/31/20 1006   01/27/20 1730  vancomycin (VANCOCIN) IVPB 1000 mg/200 mL premix  Status:  Discontinued     1,000 mg 200 mL/hr over 60 Minutes Intravenous  Once 01/27/20 1725 01/27/20 1726   01/27/20 1730  cefTRIAXone (ROCEPHIN) 2 g in sodium chloride 0.9 % 100 mL IVPB     2 g 200 mL/hr over 30 Minutes Intravenous  Once 01/27/20 1725 01/27/20 2010   01/27/20 1730  vancomycin (VANCOREADY) IVPB 2000 mg/400 mL     2,000 mg 200 mL/hr over 120 Minutes Intravenous  Once 01/27/20 1726 01/27/20 2219       Time spent: 15- minutes-Greater than 50% of this time was spent in counseling,  explanation of diagnosis, planning of further management, and coordination of care.  MEDICATIONS: Scheduled Meds: . docusate sodium  100 mg Oral BID  . folic acid  1 mg Oral Daily  . metoprolol tartrate  25 mg Oral BID  . multivitamin with minerals  1 tablet Oral Daily  . nicotine  14 mg Transdermal Daily  . thiamine  100 mg Oral Daily  . warfarin  12.5 mg Oral ONCE-1600  . Warfarin - Pharmacist Dosing Inpatient   Does not apply q1800   Continuous Infusions: . heparin 1,350 Units/hr (02/09/20 1142)  . lactated ringers 10 mL/hr at 02/04/20 1304   PRN Meds:.acetaminophen **OR** acetaminophen, ALPRAZolam, metoprolol tartrate, morphine injection, ondansetron **OR** ondansetron (ZOFRAN) IV, oxyCODONE   PHYSICAL EXAM: Vital signs: Vitals:   02/08/20 0547 02/08/20 1209 02/08/20 2109 02/09/20 0543  BP: (!) 121/108 (!) 124/102 (!) 144/92 (!) 132/98  Pulse: 73  66 62  Resp: 20 18 16 18   Temp: 98.1 F (36.7 C) 98.3 F (36.8 C) 98.4 F (36.9 C) 97.9 F (36.6 C)  TempSrc: Oral Oral Oral Oral  SpO2: 99%  100% 100%  Weight:    89 kg  Height:       Filed Weights   02/07/20 0509 02/08/20 0500 02/09/20 0543  Weight: 92.7 kg 93.9 kg 89 kg   Body mass index is 28.98 kg/m.   Gen Exam:Alert awake-not in any distress HEENT:atraumatic, normocephalic Chest: B/L clear to auscultation anteriorly CVS:S1S2 regular Abdomen:soft non tender, non distended Extremities:no edema Neurology: Non focal Skin: no rash  I have personally reviewed following labs and imaging studies  LABORATORY DATA: CBC: Recent Labs  Lab 02/05/20 0347 02/06/20 0210 02/07/20 0257 02/08/20 0324 02/09/20 0437  WBC 3.1* 3.3* 3.8* 3.2* 3.1*  HGB 8.4* 8.1* 8.9* 9.2* 8.4*  HCT 26.4* 25.5* 27.9* 29.5* 26.7*  MCV 83.3 83.1 84.5 84.3 83.7  PLT 177 192 215 243 [id number]    Basic Metabolic Panel: Recent Labs  Lab 02/04/20 0220 02/06/20 0210 02/07/20 0257 02/08/20 0324 02/09/20 0437  NA  --  135 137 137 138  K  --   4.2 3.8 4.0 3.7  CL  --  101 101 100 102  CO2  --  24 26 26 26   GLUCOSE  --  109* 116* 103* 113*  BUN  --  8 6 6 6   CREATININE  --  0.74 0.75 0.96 0.75  CALCIUM  --  8.6* 9.0 9.3 9.2  MG 1.5* 1.6* 1.7 1.9 1.5*    GFR: Estimated Creatinine Clearance: 108.3 mL/min (by C-G formula based on SCr of 0.75 mg/dL).  Liver Function Tests: No results for input(s): AST, ALT, ALKPHOS, BILITOT, PROT, ALBUMIN in the last 168 hours. No results for input(s): LIPASE, AMYLASE in the last 168 hours. No results for input(s): AMMONIA in the last 168 hours.  Coagulation Profile: Recent Labs  Lab 02/05/20 0347 02/06/20 0210 02/07/20 0257 02/08/20 0324 02/09/20 0437  INR 1.4* 1.5* 1.6* 1.6* 1.7*    Cardiac Enzymes: No results for input(s): CKTOTAL, CKMB, CKMBINDEX, TROPONINI in the last 168 hours.  BNP (last 3 results) No results for input(s): PROBNP in the last 8760 hours.  Lipid Profile:  No results for input(s): CHOL, HDL, LDLCALC, TRIG, CHOLHDL, LDLDIRECT in the last 72 hours.  Thyroid Function Tests: No results for input(s): TSH, T4TOTAL, FREET4, T3FREE, THYROIDAB in the last 72 hours.  Anemia Panel: No results for input(s): VITAMINB12, FOLATE, FERRITIN, TIBC, IRON, RETICCTPCT in the last 72 hours.  Urine analysis:    Component Value Date/Time   COLORURINE YELLOW 01/27/2020 1841   APPEARANCEUR CLEAR 01/27/2020 1841   APPEARANCEUR Clear 08/03/2019 1143   LABSPEC 1.015 01/27/2020 1841   PHURINE 6.0 01/27/2020 1841   GLUCOSEU NEGATIVE 01/27/2020 1841   HGBUR SMALL (A) 01/27/2020 1841   BILIRUBINUR NEGATIVE 01/27/2020 1841   BILIRUBINUR small (A) 08/03/2019 1144   BILIRUBINUR Negative 08/03/2019 1143   KETONESUR 5 (A) 01/27/2020 1841   PROTEINUR 30 (A) 01/27/2020 1841   UROBILINOGEN 1.0 08/03/2019 1144   UROBILINOGEN 1.0 01/25/2013 0825   NITRITE NEGATIVE 01/27/2020 1841   LEUKOCYTESUR NEGATIVE 01/27/2020 1841    Sepsis Labs: Lactic Acid, Venous    Component Value  Date/Time   LATICACIDVEN 1.2 01/28/2020 0240    MICROBIOLOGY: No results found for this or any previous visit (from the past 240 hour(s)).  RADIOLOGY STUDIES/RESULTS: No results found.   LOS: 13 days   Oren Binet, MD  [facility]    To contact the attending provider between 7A-7P or the covering provider during after hours 7P-7A, please log into the web site [email or web address] and access using universal [place] password for that web site. If you do not have the password, please call the hospital operator.  02/09/2020, 12:21 PM

## 2020-02-10 LAB — CBC
HCT: 29.2 % — ABNORMAL LOW (ref 39.0–52.0)
Hemoglobin: 9.3 g/dL — ABNORMAL LOW (ref 13.0–17.0)
MCH: 26.6 pg (ref 26.0–34.0)
MCHC: 31.8 g/dL (ref 30.0–36.0)
MCV: 83.7 fL (ref 80.0–100.0)
Platelets: 227 10*3/uL (ref 150–400)
RBC: 3.49 MIL/uL — ABNORMAL LOW (ref 4.22–5.81)
RDW: 15.2 % (ref 11.5–15.5)
WBC: 3.7 10*3/uL — ABNORMAL LOW (ref 4.0–10.5)
nRBC: 0 % (ref 0.0–0.2)

## 2020-02-10 LAB — MAGNESIUM: Magnesium: 1.6 mg/dL — ABNORMAL LOW (ref 1.7–2.4)

## 2020-02-10 LAB — BASIC METABOLIC PANEL
Anion gap: 9 (ref 5–15)
BUN: 7 mg/dL (ref 6–20)
CO2: 26 mmol/L (ref 22–32)
Calcium: 9.2 mg/dL (ref 8.9–10.3)
Chloride: 102 mmol/L (ref 98–111)
Creatinine, Ser: 0.8 mg/dL (ref 0.61–1.24)
GFR calc Af Amer: >60 mL/min (ref >60)
GFR calc non Af Amer: >60 mL/min (ref >60)
Glucose, Bld: 116 mg/dL — ABNORMAL HIGH (ref 70–99)
Potassium: 3.8 mmol/L (ref 3.5–5.1)
Sodium: 137 mmol/L (ref 135–145)

## 2020-02-10 LAB — PROTIME-INR
INR: 2.2 — ABNORMAL HIGH (ref 0.8–1.2)
Prothrombin Time: 24.8 seconds — ABNORMAL HIGH (ref 11.4–15.2)

## 2020-02-10 LAB — HEPARIN LEVEL (UNFRACTIONATED): Heparin Unfractionated: 0.48 IU/mL (ref 0.30–0.70)

## 2020-02-10 MED ORDER — WARFARIN SODIUM 5 MG PO TABS
10.0000 mg | ORAL_TABLET | Freq: Once | ORAL | Status: AC
  Administered 2020-02-10: 10 mg via ORAL
  Filled 2020-02-10: qty 2

## 2020-02-10 MED ORDER — MAGNESIUM SULFATE 4 GM/100ML IV SOLN
4.0000 g | Freq: Once | INTRAVENOUS | Status: AC
  Administered 2020-02-10: 4 g via INTRAVENOUS
  Filled 2020-02-10: qty 100

## 2020-02-10 NOTE — Progress Notes (Signed)
PROGRESS NOTE        PATIENT DETAILS Name: Leonard Bentley Age: 59 y.o. Sex: male Date of Birth: 16-Jul-1961 Admit Date: 01/27/2020 Admitting Physician [name], MD IW:[id number], Vernia Buff, NP  Brief Narrative: Patient is a 59 y.o. male with history of chronic diastolic heart failure, severe MR s/p mitral valve repair, recurrent PE on Coumadin, non-Hodgkin's lymphoma, alcohol abuse recent hospitalization from 3/5-3/12 osteomyelitis-requiring left first and second ray amputation who presented with wound dehiscence (noncompliant to weightbearing status), drainage and newly displaced fracture of the third metatarsal head with underlying osteomyelitis.  Patient was evaluated by orthopedics and underwent left third ray amputation.  Significant events: 3/5-3/12>> admission for left foot osteomyelitis-s/p left first and second ray amputation 3/21>> admit for left foot wound dehiscence-third metatarsal head displaced fracture with possible underlying osteomyelitis   Antimicrobial therapy: Ceftriaxone: 3/21>> 3/24 Vancomycin: 3/21>> 3/25  Microbiology data: 3/21>> blood culture: Negative  Procedures : 3/24>> left third ray amputation  Consults: Orthopedics  DVT Prophylaxis : Full dose anticoagulation with Heparin/Coumadin  Subjective: No major events overnight-lying comfortably in bed-denies any chest pain or shortness of breath  Assessment/Plan: Left foot wound dehiscence with left third metatarsal fracture with possible underlying osteomyelitis-s/p left third ray amputation: Orthopedics following-continue supportive care.  Per orthopedics-needs to be strict nonweightbearing on the left foot.  Anemia: No evidence of blood loss-I suspect this is secondary to inflammation related to chronic osteomyelitis/wound infection.  Follow for now.  Chronic diastolic heart failure: Compensated-follow volume status closely  History of severe MR-s/p mitral valve  repair with annular ring prosthesis 2017: Stable-outpatient follow-up with cardiology  History of recurrent pulmonary embolism: On overlapping heparin/Coumadin-pharmacy managing-INR still subtherapeutic  History of SVT/PACs: Stable-on beta-blocker.  History of tobacco use: Continue transdermal nicotine  History of non-Hodgkin's lymphoma in remission: Follow-up with oncology in the outpatient setting  Diet: Diet Order            Diet Carb Modified Fluid consistency: Thin; Room service appropriate? Yes  Diet effective now              Code Status: Full code  Family Communication: Patient to update family-he is awake and alert.  Disposition Plan:  SNF when ready for discharge  Barriers to Discharge: Awaiting SNF bed  Antimicrobial agents: Anti-infectives (From admission, onward)   Start     Dose/Rate Route Frequency Ordered Stop   01/30/20 0830  ceFAZolin (ANCEF) IVPB 2g/100 mL premix  Status:  Discontinued     2 g 200 mL/hr over 30 Minutes Intravenous To Short Stay 01/30/20 0824 01/30/20 1330   01/28/20 1800  cefTRIAXone (ROCEPHIN) 2 g in sodium chloride 0.9 % 100 mL IVPB     2 g 200 mL/hr over 30 Minutes Intravenous Every 24 hours 01/27/20 2211 01/30/20 1900   01/28/20 0600  vancomycin (VANCOCIN) IVPB 1000 mg/200 mL premix     1,000 mg 200 mL/hr over 60 Minutes Intravenous Every 12 hours 01/27/20 1738 01/31/20 1006   01/27/20 1730  vancomycin (VANCOCIN) IVPB 1000 mg/200 mL premix  Status:  Discontinued     1,000 mg 200 mL/hr over 60 Minutes Intravenous  Once 01/27/20 1725 01/27/20 1726   01/27/20 1730  cefTRIAXone (ROCEPHIN) 2 g in sodium chloride 0.9 % 100 mL IVPB     2 g 200 mL/hr over 30 Minutes Intravenous  Once 01/27/20  1725 01/27/20 2010   01/27/20 1730  vancomycin (VANCOREADY) IVPB 2000 mg/400 mL     2,000 mg 200 mL/hr over 120 Minutes Intravenous  Once 01/27/20 1726 01/27/20 2219       Time spent: 15- minutes-Greater than 50% of this time was spent in  counseling, explanation of diagnosis, planning of further management, and coordination of care.  MEDICATIONS: Scheduled Meds: . docusate sodium  100 mg Oral BID  . folic acid  1 mg Oral Daily  . metoprolol tartrate  25 mg Oral BID  . multivitamin with minerals  1 tablet Oral Daily  . nicotine  14 mg Transdermal Daily  . thiamine  100 mg Oral Daily  . warfarin  10 mg Oral ONCE-1600  . Warfarin - Pharmacist Dosing Inpatient   Does not apply q1800   Continuous Infusions: . heparin 1,350 Units/hr (02/09/20 1854)  . lactated ringers 10 mL/hr at 02/04/20 1304   PRN Meds:.acetaminophen **OR** acetaminophen, ALPRAZolam, metoprolol tartrate, morphine injection, ondansetron **OR** ondansetron (ZOFRAN) IV, oxyCODONE   PHYSICAL EXAM: Vital signs: Vitals:   02/09/20 1523 02/09/20 2123 02/10/20 0025 02/10/20 0619  BP: (!) 153/118 (!) 137/106 (!) 154/113 (!) 137/103  Pulse: 76 77 79 (!) 57  Resp:  18  17  Temp:  98.5 F (36.9 C) 98.3 F (36.8 C) 98.2 F (36.8 C)  TempSrc:  Oral Oral Oral  SpO2: 100% 100% 100% 100%  Weight:      Height:       Filed Weights   02/07/20 0509 02/08/20 0500 02/09/20 0543  Weight: 92.7 kg 93.9 kg 89 kg   Body mass index is 28.98 kg/m.   Gen Exam:Alert awake-not in any distress HEENT:atraumatic, normocephalic Chest: B/L clear to auscultation anteriorly CVS:S1S2 regular Abdomen:soft non tender, non distended Extremities:no edema Neurology: Non focal Skin: no rash  I have personally reviewed following labs and imaging studies  LABORATORY DATA: CBC: Recent Labs  Lab 02/06/20 0210 02/07/20 0257 02/08/20 0324 02/09/20 0437 02/10/20 0536  WBC 3.3* 3.8* 3.2* 3.1* 3.7*  HGB 8.1* 8.9* 9.2* 8.4* 9.3*  HCT 25.5* 27.9* 29.5* 26.7* 29.2*  MCV 83.1 84.5 84.3 83.7 83.7  PLT 192 215 243 230 [id number]    Basic Metabolic Panel: Recent Labs  Lab 02/06/20 0210 02/07/20 0257 02/08/20 0324 02/09/20 0437 02/10/20 0536  NA 135 137 137 138 137  K 4.2 3.8 4.0  3.7 3.8  CL 101 101 100 102 102  CO2 24 26 26 26 26   GLUCOSE 109* 116* 103* 113* 116*  BUN 8 6 6 6 7   CREATININE 0.74 0.75 0.96 0.75 0.80  CALCIUM 8.6* 9.0 9.3 9.2 9.2  MG 1.6* 1.7 1.9 1.5* 1.6*    GFR: Estimated Creatinine Clearance: 108.3 mL/min (by C-G formula based on SCr of 0.8 mg/dL).  Liver Function Tests: No results for input(s): AST, ALT, ALKPHOS, BILITOT, PROT, ALBUMIN in the last 168 hours. No results for input(s): LIPASE, AMYLASE in the last 168 hours. No results for input(s): AMMONIA in the last 168 hours.  Coagulation Profile: Recent Labs  Lab 02/06/20 0210 02/07/20 0257 02/08/20 0324 02/09/20 0437 02/10/20 0536  INR 1.5* 1.6* 1.6* 1.7* 2.2*    Cardiac Enzymes: No results for input(s): CKTOTAL, CKMB, CKMBINDEX, TROPONINI in the last 168 hours.  BNP (last 3 results) No results for input(s): PROBNP in the last 8760 hours.  Lipid Profile: No results for input(s): CHOL, HDL, LDLCALC, TRIG, CHOLHDL, LDLDIRECT in the last 72 hours.  Thyroid Function Tests: No results  for input(s): TSH, T4TOTAL, FREET4, T3FREE, THYROIDAB in the last 72 hours.  Anemia Panel: No results for input(s): VITAMINB12, FOLATE, FERRITIN, TIBC, IRON, RETICCTPCT in the last 72 hours.  Urine analysis:    Component Value Date/Time   COLORURINE YELLOW 01/27/2020 1841   APPEARANCEUR CLEAR 01/27/2020 1841   APPEARANCEUR Clear 08/03/2019 1143   LABSPEC 1.015 01/27/2020 1841   PHURINE 6.0 01/27/2020 1841   GLUCOSEU NEGATIVE 01/27/2020 1841   HGBUR SMALL (A) 01/27/2020 1841   BILIRUBINUR NEGATIVE 01/27/2020 1841   BILIRUBINUR small (A) 08/03/2019 1144   BILIRUBINUR Negative 08/03/2019 1143   KETONESUR 5 (A) 01/27/2020 1841   PROTEINUR 30 (A) 01/27/2020 1841   UROBILINOGEN 1.0 08/03/2019 1144   UROBILINOGEN 1.0 01/25/2013 0825   NITRITE NEGATIVE 01/27/2020 1841   LEUKOCYTESUR NEGATIVE 01/27/2020 1841    Sepsis Labs: Lactic Acid, Venous    Component Value Date/Time   LATICACIDVEN  1.2 01/28/2020 0240    MICROBIOLOGY: No results found for this or any previous visit (from the past 240 hour(s)).  RADIOLOGY STUDIES/RESULTS: No results found.   LOS: 14 days   Oren Binet, MD  [facility]    To contact the attending provider between 7A-7P or the covering provider during after hours 7P-7A, please log into the web site [email or web address] and access using universal  password for that web site. If you do not have the password, please call the hospital operator.  02/10/2020, 2:54 PM

## 2020-02-10 NOTE — Progress Notes (Signed)
ANTICOAGULATION CONSULT NOTE - Follow Up Consult  Pharmacy Consult for Heparin and Warfarin Indication: hx recurrent PE  Allergies  Allergen Reactions  . Other Other (See Comments)    "Cactus" caused blisters on tongue (if prepared as food)    Patient Measurements: Height: 5\' 9"  (175.3 cm)(office visit 01/23/20) Weight: 89 kg (196 lb 3.4 oz) IBW/kg (Calculated) : 70.7 Heparin Dosing Weight: 89 kg  Vital Signs: Temp: 98.2 F (36.8 C) (04/04 0619) Temp Source: Oral (04/04 0619) BP: 137/103 (04/04 0619) Pulse Rate: 57 (04/04 0619)  Labs: Recent Labs    02/08/20 0324 02/08/20 0324 02/09/20 0437 02/10/20 0536  HGB 9.2*   < > 8.4* 9.3*  HCT 29.5*  --  26.7* 29.2*  PLT 243  --  230 227  LABPROT 19.2*  --  19.5* 24.8*  INR 1.6*  --  1.7* 2.2*  HEPARINUNFRC 0.51  --  0.45 0.48  CREATININE 0.96  --  0.75 0.80   < > = values in this interval not displayed.    Estimated Creatinine Clearance: 108.3 mL/min (by C-G formula based on SCr of 0.8 mg/dL).  Assessment: 59 yr old male on Warfarin PTA for hx recurrent PE (04/2016).  INR supratherapeutic (3.8) on admit 3/21.  Warfarin held and Vitamin K 2.5 mg IV given on 3/22 for 3rd ray amputation on 3/24.  Warfarin resumed on 3/25 with Heparin bridge. Heparin held on 3/29 am after he bumped foot and had copious bleeding. Resumed that afternoon after bleeding has decreased. No further bleeding reported.    Heparin level remains therapeutic (0.48) on 1350 units/hr.   INR therapeutic at 2.2 today. CBC stable.     PTA warfarin: 5 mg daily.  Goal of Therapy:  INR 2-3 Heparin level 0.3-0.7 units/ml Monitor platelets by anticoagulation protocol: Yes   Plan:   Continue heparin drip at 1350 units/hr  Give Warfarin 10 mg x 1 today.  Discontinue heparin infusion if INR is therapeutic tomorrow morning  Daily heparin level, PT/INR and CBC.  Agnes Lawrence, PharmD PGY1 Pharmacy Resident

## 2020-02-11 LAB — PROTIME-INR
INR: 2.6 — ABNORMAL HIGH (ref 0.8–1.2)
Prothrombin Time: 27.7 seconds — ABNORMAL HIGH (ref 11.4–15.2)

## 2020-02-11 LAB — MAGNESIUM: Magnesium: 1.7 mg/dL (ref 1.7–2.4)

## 2020-02-11 LAB — CBC
HCT: 27.4 % — ABNORMAL LOW (ref 39.0–52.0)
Hemoglobin: 8.9 g/dL — ABNORMAL LOW (ref 13.0–17.0)
MCH: 27.1 pg (ref 26.0–34.0)
MCHC: 32.5 g/dL (ref 30.0–36.0)
MCV: 83.3 fL (ref 80.0–100.0)
Platelets: 249 10*3/uL (ref 150–400)
RBC: 3.29 MIL/uL — ABNORMAL LOW (ref 4.22–5.81)
RDW: 14.9 % (ref 11.5–15.5)
WBC: 3.3 10*3/uL — ABNORMAL LOW (ref 4.0–10.5)
nRBC: 0 % (ref 0.0–0.2)

## 2020-02-11 LAB — HEPARIN LEVEL (UNFRACTIONATED): Heparin Unfractionated: 0.51 IU/mL (ref 0.30–0.70)

## 2020-02-11 MED ORDER — WARFARIN SODIUM 7.5 MG PO TABS
7.5000 mg | ORAL_TABLET | Freq: Once | ORAL | Status: AC
  Administered 2020-02-11: 7.5 mg via ORAL

## 2020-02-11 MED ORDER — MAGNESIUM SULFATE 4 GM/100ML IV SOLN
4.0000 g | Freq: Once | INTRAVENOUS | Status: AC
  Administered 2020-02-11: 4 g via INTRAVENOUS
  Filled 2020-02-11: qty 100

## 2020-02-11 NOTE — TOC Progression Note (Signed)
Transition of Care [facility]) - Progression Note    Patient Details  Name: Leonard Bentley MRN: [id number] Date of Birth: [birth year]  Transition of Care [facility]) CM/SW [place], LCSW Phone Number: 02/11/2020, 3:57 PM  Clinical Narrative:    Patient continues to not have any SNF bed offers due to lack of insurance. CSW to continue to follow.     Barriers to Discharge: Continued Medical Work up, SNF Pending bed offer, SNF Pending payor source - LOG  Expected Discharge Plan and Services   In-house Referral: Clinical Social Work, Risk manager Acute Care Choice: [place] Living arrangements for the past 2 months: [place] Determinants of Health (SDOH) Interventions    Readmission Risk Interventions Readmission Risk Prevention Plan 01/31/2020 01/16/2020 01/18/2019  Transportation Screening Complete Complete Complete  PCP or Specialist Appt within 5-7 Days Complete Complete -  Home Care Screening Complete Complete -  Medication Review (RN CM) Complete Complete -  Medication Review ([place]) - - Complete  [place] or [place] - - Complete  [place] - - Not Applicable  Some recent data might be hidden

## 2020-02-11 NOTE — Progress Notes (Signed)
ANTICOAGULATION CONSULT NOTE - Follow Up Consult  Pharmacy Consult for Heparin and Warfarin Indication: hx recurrent PE  Allergies  Allergen Reactions  . Other Other (See Comments)    "Cactus" caused blisters on tongue (if prepared as food)    Patient Measurements: Height: 5\' 9"  (175.3 cm)(office visit 01/23/20) Weight: 91.8 kg (202 lb 6.1 oz) IBW/kg (Calculated) : 70.7 Heparin Dosing Weight: 89 kg  Vital Signs: Temp: 98.5 F (36.9 C) (04/05 0459) Temp Source: Oral (04/05 0459) BP: 120/104 (04/05 0459) Pulse Rate: 78 (04/05 0459)  Labs: Recent Labs    02/09/20 0437 02/09/20 0437 02/10/20 0536 02/11/20 0401  HGB 8.4*   < > 9.3* 8.9*  HCT 26.7*  --  29.2* 27.4*  PLT 230  --  227 249  LABPROT 19.5*  --  24.8* 27.7*  INR 1.7*  --  2.2* 2.6*  HEPARINUNFRC 0.45  --  0.48 0.51  CREATININE 0.75  --  0.80  --    < > = values in this interval not displayed.    Estimated Creatinine Clearance: 109.9 mL/min (by C-G formula based on SCr of 0.8 mg/dL).  Assessment: 59 yr old male on Warfarin PTA for hx recurrent PE (04/2016).  INR supratherapeutic (3.8) on admit 3/21.  Warfarin held and Vitamin K 2.5 mg IV given on 3/22 for 3rd ray amputation on 3/24.  Warfarin resumed on 3/25 with Heparin bridge. Heparin held on 3/29 am after he bumped foot and had copious bleeding. Resumed that afternoon after bleeding has decreased. No further bleeding reported.  INR now therapeutic, trending up 2.6  Goal of Therapy:  INR 2-3 Heparin level 0.3-0.7 units/ml Monitor platelets by anticoagulation protocol: Yes   Plan:  DC heparin and heparin labs Warfarin 7.5 mg po x 1 dose tonight Daily INR  Thank you Anette Guarneri, PharmD

## 2020-02-11 NOTE — Progress Notes (Signed)
PROGRESS NOTE        PATIENT DETAILS Name: Leonard Bentley Age: 59 y.o. Sex: male Date of Birth: Aug 01, 1961 Admit Date: 01/27/2020 Admitting Physician [name], MD [id number], Vernia Buff, NP  Brief Narrative: Patient is a 59 y.o. male with history of chronic diastolic heart failure, severe MR s/p mitral valve repair, recurrent PE on Coumadin, non-Hodgkin's lymphoma, alcohol abuse recent hospitalization from 3/5-3/12 osteomyelitis-requiring left first and second ray amputation who presented with wound dehiscence (noncompliant to weightbearing status), drainage and newly displaced fracture of the third metatarsal head with underlying osteomyelitis.  Patient was evaluated by orthopedics and underwent left third ray amputation.  Significant events: 3/5-3/12>> admission for left foot osteomyelitis-s/p left first and second ray amputation 3/21>> admit for left foot wound dehiscence-third metatarsal head displaced fracture with possible underlying osteomyelitis   Antimicrobial therapy: Ceftriaxone: 3/21>> 3/24 Vancomycin: 3/21>> 3/25  Microbiology data: 3/21>> blood culture: Negative  Procedures : 3/24>> left third ray amputation  Consults: Orthopedics  DVT Prophylaxis : Full dose anticoagulation with Heparin/Coumadin  Subjective: Lying comfortably in bed-denies any chest pain or shortness of breath.  Assessment/Plan: Left foot wound dehiscence with left third metatarsal fracture with possible underlying osteomyelitis-s/p left third ray amputation: Orthopedics following-continue supportive care.  Per orthopedics-needs to be strict nonweightbearing on the left foot.  Hypomagnesemia: Continue to replete and recheck.  Anemia: No evidence of blood loss-I suspect this is secondary to inflammation related to chronic osteomyelitis/wound infection.  Follow for now.  Chronic diastolic heart failure: Compensated-follow volume status closely  History of  severe MR-s/p mitral valve repair with annular ring prosthesis 2017: Stable-outpatient follow-up with cardiology  History of recurrent pulmonary embolism: On overlapping heparin/Coumadin-pharmacy managing-INR still subtherapeutic  History of SVT/PACs: Stable-on beta-blocker.  History of tobacco use: Continue transdermal nicotine  History of non-Hodgkin's lymphoma in remission: Follow-up with oncology in the outpatient setting  Diet: Diet Order            Diet Carb Modified Fluid consistency: Thin; Room service appropriate? Yes  Diet effective now              Code Status: Full code  Family Communication: Patient to update family-he is awake and alert.  Disposition Plan:  SNF when ready for discharge  Barriers to Discharge: Awaiting SNF bed  Antimicrobial agents: Anti-infectives (From admission, onward)   Start     Dose/Rate Route Frequency Ordered Stop   01/30/20 0830  ceFAZolin (ANCEF) IVPB 2g/100 mL premix  Status:  Discontinued     2 g 200 mL/hr over 30 Minutes Intravenous To Short Stay 01/30/20 0824 01/30/20 1330   01/28/20 1800  cefTRIAXone (ROCEPHIN) 2 g in sodium chloride 0.9 % 100 mL IVPB     2 g 200 mL/hr over 30 Minutes Intravenous Every 24 hours 01/27/20 2211 01/30/20 1900   01/28/20 0600  vancomycin (VANCOCIN) IVPB 1000 mg/200 mL premix     1,000 mg 200 mL/hr over 60 Minutes Intravenous Every 12 hours 01/27/20 1738 01/31/20 1006   01/27/20 1730  vancomycin (VANCOCIN) IVPB 1000 mg/200 mL premix  Status:  Discontinued     1,000 mg 200 mL/hr over 60 Minutes Intravenous  Once 01/27/20 1725 01/27/20 1726   01/27/20 1730  cefTRIAXone (ROCEPHIN) 2 g in sodium chloride 0.9 % 100 mL IVPB     2 g 200 mL/hr over 30 Minutes  Intravenous  Once 01/27/20 1725 01/27/20 2010   01/27/20 1730  vancomycin (VANCOREADY) IVPB 2000 mg/400 mL     2,000 mg 200 mL/hr over 120 Minutes Intravenous  Once 01/27/20 1726 01/27/20 2219       Time spent: 15- minutes-Greater than 50%  of this time was spent in counseling, explanation of diagnosis, planning of further management, and coordination of care.  MEDICATIONS: Scheduled Meds: . docusate sodium  100 mg Oral BID  . folic acid  1 mg Oral Daily  . metoprolol tartrate  25 mg Oral BID  . multivitamin with minerals  1 tablet Oral Daily  . nicotine  14 mg Transdermal Daily  . thiamine  100 mg Oral Daily  . warfarin  7.5 mg Oral ONCE-1600  . Warfarin - Pharmacist Dosing Inpatient   Does not apply q1800   Continuous Infusions: . lactated ringers 10 mL/hr at 02/04/20 1304   PRN Meds:.acetaminophen **OR** acetaminophen, ALPRAZolam, metoprolol tartrate, morphine injection, ondansetron **OR** ondansetron (ZOFRAN) IV, oxyCODONE   PHYSICAL EXAM: Vital signs: Vitals:   02/10/20 2131 02/11/20 0459 02/11/20 0500 02/11/20 1341  BP: (!) 145/105 (!) 120/104  103/89  Pulse: 89 78  72  Resp: 18     Temp: 97.8 F (36.6 C) 98.5 F (36.9 C)  98.4 F (36.9 C)  TempSrc:  Oral  Oral  SpO2: 100% 100%    Weight:   91.8 kg   Height:       Filed Weights   02/08/20 0500 02/09/20 0543 02/11/20 0500  Weight: 93.9 kg 89 kg 91.8 kg   Body mass index is 29.89 kg/m.   Gen Exam:Alert awake-not in any distress HEENT:atraumatic, normocephalic Chest: B/L clear to auscultation anteriorly CVS:S1S2 regular Abdomen:soft non tender, non distended Extremities:no edema Neurology: Non focal Skin: no rash  I have personally reviewed following labs and imaging studies  LABORATORY DATA: CBC: Recent Labs  Lab 02/07/20 0257 02/08/20 0324 02/09/20 0437 02/10/20 0536 02/11/20 0401  WBC 3.8* 3.2* 3.1* 3.7* 3.3*  HGB 8.9* 9.2* 8.4* 9.3* 8.9*  HCT 27.9* 29.5* 26.7* 29.2* 27.4*  MCV 84.5 84.3 83.7 83.7 83.3  PLT 215 243 230 227 0000000    Basic Metabolic Panel: Recent Labs  Lab 02/06/20 0210 02/06/20 0210 02/07/20 0257 02/08/20 0324 02/09/20 0437 02/10/20 0536 02/11/20 0804  NA 135  --  137 137 138 137  --   K 4.2  --  3.8 4.0  3.7 3.8  --   CL 101  --  101 100 102 102  --   CO2 24  --  26 26 26 26   --   GLUCOSE 109*  --  116* 103* 113* 116*  --   BUN 8  --  6 6 6 7   --   CREATININE 0.74  --  0.75 0.96 0.75 0.80  --   CALCIUM 8.6*  --  9.0 9.3 9.2 9.2  --   MG 1.6*   < > 1.7 1.9 1.5* 1.6* 1.7   < > = values in this interval not displayed.    GFR: Estimated Creatinine Clearance: 109.9 mL/min (by C-G formula based on SCr of 0.8 mg/dL).  Liver Function Tests: No results for input(s): AST, ALT, ALKPHOS, BILITOT, PROT, ALBUMIN in the last 168 hours. No results for input(s): LIPASE, AMYLASE in the last 168 hours. No results for input(s): AMMONIA in the last 168 hours.  Coagulation Profile: Recent Labs  Lab 02/07/20 0257 02/08/20 0324 02/09/20 [id number] 02/10/20 0536 02/11/20 0401  INR 1.6* 1.6* 1.7* 2.2* 2.6*    Cardiac Enzymes: No results for input(s): CKTOTAL, CKMB, CKMBINDEX, TROPONINI in the last 168 hours.  BNP (last 3 results) No results for input(s): PROBNP in the last 8760 hours.  Lipid Profile: No results for input(s): CHOL, HDL, LDLCALC, TRIG, CHOLHDL, LDLDIRECT in the last 72 hours.  Thyroid Function Tests: No results for input(s): TSH, T4TOTAL, FREET4, T3FREE, THYROIDAB in the last 72 hours.  Anemia Panel: No results for input(s): VITAMINB12, FOLATE, FERRITIN, TIBC, IRON, RETICCTPCT in the last 72 hours.  Urine analysis:    Component Value Date/Time   COLORURINE YELLOW 01/27/2020 1841   APPEARANCEUR CLEAR 01/27/2020 1841   APPEARANCEUR Clear 08/03/2019 1143   LABSPEC 1.015 01/27/2020 1841   PHURINE 6.0 01/27/2020 1841   GLUCOSEU NEGATIVE 01/27/2020 1841   HGBUR SMALL (A) 01/27/2020 1841   BILIRUBINUR NEGATIVE 01/27/2020 1841   BILIRUBINUR small (A) 08/03/2019 1144   BILIRUBINUR Negative 08/03/2019 1143   KETONESUR 5 (A) 01/27/2020 1841   PROTEINUR 30 (A) 01/27/2020 1841   UROBILINOGEN 1.0 08/03/2019 1144   UROBILINOGEN 1.0 01/25/2013 0825   NITRITE NEGATIVE 01/27/2020 1841    LEUKOCYTESUR NEGATIVE 01/27/2020 1841    Sepsis Labs: Lactic Acid, Venous    Component Value Date/Time   LATICACIDVEN 1.2 01/28/2020 0240    MICROBIOLOGY: No results found for this or any previous visit (from the past 240 hour(s)).  RADIOLOGY STUDIES/RESULTS: No results found.   LOS: 15 days   Oren Binet, MD  [facility]    To contact the attending provider between 7A-7P or the covering provider during after hours 7P-7A, please log into the web site [email or web address] and access using universal [place] password for that web site. If you do not have the password, please call the hospital operator.  02/11/2020, 2:02 PM

## 2020-02-11 NOTE — Progress Notes (Signed)
Physical Therapy Treatment Patient Details Name: Leonard Bentley Sweetser MRN: [id number] DOB: [birth year] Today's Date: 02/11/2020    History of Present Illness 59 y.o. male with what a recent left foot first and second ray amputation for osteomyelitis and discharged on March 10/27/2020 with history of minimally invasive mitral valve repair, chronic combined systolic and diastolic CHF last EF is improved to 55 to 60%, alcohol abuse, non-Hodgkin's lymphoma stage II in remission presents to the ER with complaint of increasing pain and discharge from the left foot. following L foot first and 2nd ray amputation.   X-rays show fracture involving the left third metatarsal concerning for osteomyelitis. Admitted 01/27/20 for amputation revision sx and s/p 3rd ray amputation on 01/30/20.    PT Comments    Pt very motivated and continues to demonstrate improved endurance and transfers.  Does need cues for safety, pacing, and rest breaks.  Pt unsafe to return home from PT perspective with NWB status due to mildly decreased safety awarenss.    Follow Up Recommendations  SNF     Equipment Recommendations  Wheelchair cushion (measurements PT);Wheelchair (measurements PT)    Recommendations for Other Services       Precautions / Restrictions Precautions Precautions: Fall Required Braces or Orthoses: Other Brace Other Brace: post-op shoe Restrictions LLE Weight Bearing: Non weight bearing    Mobility  Bed Mobility Overal bed mobility: Independent             General bed mobility comments: At EOB upon arrival  Transfers Overall transfer level: Needs assistance Equipment used: Rolling walker (2 wheeled) Transfers: Sit to/from Stand Sit to Stand: Supervision         General transfer comment: sit to stand x 3  Ambulation/Gait Ambulation/Gait assistance: Min guard Gait Distance (Feet): 120 Feet(and 75') Assistive device: Rolling walker (2 wheeled) Gait Pattern/deviations: Step-to pattern      General Gait Details: Pt able to hop on R LE and maintain NWB;  ambulated 120' and then 37' after seated rest break; required cues for pacing and rest breaks (pt with tremors and DOE of 3/4 but wanting to go further - educated on rest breaks)   Marine scientist Rankin (Stroke Patients Only)       Balance     Sitting balance-Leahy Scale: Normal     Standing balance support: Bilateral upper extremity supported;During functional activity Standing balance-Leahy Scale: Fair                              Cognition Arousal/Alertness: Awake/alert Behavior During Therapy: WFL for tasks assessed/performed Overall Cognitive Status: Within Functional Limits for tasks assessed                                 General Comments: Pt required cues for pacing and safety.  Pt wanting to push endurance, but needs cues to be aware of deficits and fatigue.      Exercises Other Exercises Other Exercises: Tricep dips/mini squat R LE in standing with RW and min guard x 20 Other Exercises: Bicep curls, brachioradialis curls, forward punch with blue T band x 20    General Comments General comments (skin integrity, edema, etc.): Pt on RA.  O2 sats 100%.  HR 60's bpm at rest and up to 90-100 bpm with walking  Pertinent Vitals/Pain Pain Assessment: No/denies pain    Home Living                      Prior Function            PT Goals (current goals can now be found in the care plan section) Progress towards PT goals: Progressing toward goals    Frequency    Min 3X/week      PT Plan Current plan remains appropriate    Co-evaluation              AM-PAC PT "6 Clicks" Mobility   Outcome Measure  Help needed turning from your back to your side while in a flat bed without using bedrails?: None Help needed moving from lying on your back to sitting on the side of a flat bed without using bedrails?:  None Help needed moving to and from a bed to a chair (including a wheelchair)?: None Help needed standing up from a chair using your arms (e.g., wheelchair or bedside chair)?: None Help needed to walk in hospital room?: A Little Help needed climbing 3-5 steps with a railing? : A Lot 6 Click Score: 21    End of Session Equipment Utilized During Treatment: Gait belt Activity Tolerance: Patient tolerated treatment well Patient left: in bed;with call bell/phone within reach Nurse Communication: Mobility status PT Visit Diagnosis: Other abnormalities of gait and mobility (R26.89) Pain - Right/Left: Left     Time: [id number] PT Time Calculation (min) (ACUTE ONLY): 35 min  Charges:  $Gait Training: 8-22 mins $Therapeutic Exercise: 8-22 mins                     Maggie Font, PT Acute Rehab Services Pager [phone] [facility] [phone] [facility] [phone]    Karlton Lemon 02/11/2020, 11:41 AM

## 2020-02-12 LAB — CBC
HCT: 29 % — ABNORMAL LOW (ref 39.0–52.0)
Hemoglobin: 9.3 g/dL — ABNORMAL LOW (ref 13.0–17.0)
MCH: 27.1 pg (ref 26.0–34.0)
MCHC: 32.1 g/dL (ref 30.0–36.0)
MCV: 84.5 fL (ref 80.0–100.0)
Platelets: 241 10*3/uL (ref 150–400)
RBC: 3.43 MIL/uL — ABNORMAL LOW (ref 4.22–5.81)
RDW: 15.1 % (ref 11.5–15.5)
WBC: 3.5 10*3/uL — ABNORMAL LOW (ref 4.0–10.5)
nRBC: 0 % (ref 0.0–0.2)

## 2020-02-12 LAB — PROTIME-INR
INR: 2.7 — ABNORMAL HIGH (ref 0.8–1.2)
Prothrombin Time: 29 seconds — ABNORMAL HIGH (ref 11.4–15.2)

## 2020-02-12 LAB — MAGNESIUM: Magnesium: 1.9 mg/dL (ref 1.7–2.4)

## 2020-02-12 MED ORDER — WARFARIN SODIUM 7.5 MG PO TABS
7.5000 mg | ORAL_TABLET | Freq: Once | ORAL | Status: AC
  Administered 2020-02-12: 7.5 mg via ORAL
  Filled 2020-02-12: qty 1

## 2020-02-12 NOTE — Progress Notes (Signed)
Physical Therapy Treatment Patient Details Name: Leonard Bentley MRN: [id number] DOB: November 08, 1961 Today's Date: 02/12/2020    History of Present Illness 59 y.o. male with what a recent left foot first and second ray amputation for osteomyelitis and discharged on March 10/27/2020 with history of minimally invasive mitral valve repair, chronic combined systolic and diastolic CHF last EF is improved to 55 to 60%, alcohol abuse, non-Hodgkin's lymphoma stage II in remission presents to the ER with complaint of increasing pain and discharge from the left foot. following L foot first and 2nd ray amputation.   X-rays show fracture involving the left third metatarsal concerning for osteomyelitis. Admitted 01/27/20 for amputation revision sx and s/p 3rd ray amputation on 01/30/20.    PT Comments    Pt just finishing he whole exercise program of UE and LE's 3-4 set of 20 reps each.  Pt demonstrated each exercise, but pt ambulated in the hall, working to progress distance and improve endurance.    Follow Up Recommendations  SNF     Equipment Recommendations  Other (comment)(TBA)    Recommendations for Other Services       Precautions / Restrictions Precautions Precautions: Fall Restrictions LLE Weight Bearing: Non weight bearing    Mobility  Bed Mobility Overal bed mobility: Independent                Transfers Overall transfer level: Needs assistance Equipment used: Rolling walker (2 wheeled) Transfers: Sit to/from Stand Sit to Stand: Supervision Stand pivot transfers: Supervision          Ambulation/Gait Ambulation/Gait assistance: Min guard Gait Distance (Feet): 140 Feet Assistive device: Rolling walker (2 wheeled) Gait Pattern/deviations: Step-to pattern Gait velocity: decr Gait velocity interpretation: <1.31 ft/sec, indicative of household ambulator General Gait Details: pt maintains NWB well.  He likely pushs himself too far, showing a little more tremor as he  fatigues.   Stairs             Wheelchair Mobility    Modified Rankin (Stroke Patients Only)       Balance     Sitting balance-Leahy Scale: Normal       Standing balance-Leahy Scale: Fair Standing balance comment: safest when using at least 1 hand/UE                            Cognition Arousal/Alertness: Awake/alert Behavior During Therapy: WFL for tasks assessed/performed Overall Cognitive Status: Within Functional Limits for tasks assessed                                        Exercises      General Comments General comments (skin integrity, edema, etc.): pt demonstrated a few reps of his whole exercise program he's been doing in bed between the Upper body theraband exercise and Lower body active open chain exercise.      Pertinent Vitals/Pain Pain Assessment: Faces Faces Pain Scale: No hurt Pain Intervention(s): Monitored during session    Home Living                      Prior Function            PT Goals (current goals can now be found in the care plan section) Acute Rehab PT Goals Patient Stated Goal: to be able to walker further, to improve PT Goal Formulation:  With patient Time For Goal Achievement: 02/12/20 Potential to Achieve Goals: Fair Progress towards PT goals: Progressing toward goals    Frequency    Min 3X/week      PT Plan Current plan remains appropriate    Co-evaluation              AM-PAC PT "6 Clicks" Mobility   Outcome Measure  Help needed turning from your back to your side while in a flat bed without using bedrails?: None Help needed moving from lying on your back to sitting on the side of a flat bed without using bedrails?: None Help needed moving to and from a bed to a chair (including a wheelchair)?: None Help needed standing up from a chair using your arms (e.g., wheelchair or bedside chair)?: None Help needed to walk in hospital room?: A Little Help needed  climbing 3-5 steps with a railing? : A Lot 6 Click Score: 21    End of Session   Activity Tolerance: Patient tolerated treatment well Patient left: with call bell/phone within reach;in chair Nurse Communication: Mobility status PT Visit Diagnosis: Other abnormalities of gait and mobility (R26.89)     Time: 1250-1310 PT Time Calculation (min) (ACUTE ONLY): 20 min  Charges:  $Gait Training: 8-22 mins                     02/12/2020  Ginger Carne., PT Acute Rehabilitation Services [phone]  (pager) [phone]  (office)   Tessie Fass Waneta Fitting 02/12/2020, 3:20 PM

## 2020-02-12 NOTE — Progress Notes (Signed)
ANTICOAGULATION CONSULT NOTE - Follow Up Consult  Pharmacy Consult for Warfarin Indication: hx recurrent PE  Allergies  Allergen Reactions  . Other Other (See Comments)    "Cactus" caused blisters on tongue (if prepared as food)    Patient Measurements: Height: 5\' 9"  (175.3 cm)(office visit 01/23/20) Weight: 91.1 kg (200 lb 13.4 oz) IBW/kg (Calculated) : 70.7 Heparin Dosing Weight: 89 kg  Vital Signs: Temp: 98.2 F (36.8 C) (04/06 0505) Temp Source: Oral (04/06 0505) BP: 136/99 (04/06 0505) Pulse Rate: 68 (04/06 0505)  Labs: Recent Labs    02/10/20 0536 02/10/20 0536 02/11/20 0401 02/12/20 0052  HGB 9.3*   < > 8.9* 9.3*  HCT 29.2*  --  27.4* 29.0*  PLT 227  --  249 241  LABPROT 24.8*  --  27.7* 29.0*  INR 2.2*  --  2.6* 2.7*  HEPARINUNFRC 0.48  --  0.51  --   CREATININE 0.80  --   --   --    < > = values in this interval not displayed.    Estimated Creatinine Clearance: 109.6 mL/min (by C-G formula based on SCr of 0.8 mg/dL).  Assessment: 59 yr old male on Warfarin PTA for hx recurrent PE (04/2016).  INR supratherapeutic (3.8) on admit 3/21.  Warfarin held and Vitamin K 2.5 mg IV given on 3/22 for 3rd ray amputation on 3/24.  Warfarin resumed on 3/25 with Heparin bridge. Heparin held on 3/29 am after he bumped foot and had copious bleeding. Resumed that afternoon after bleeding has decreased. No further bleeding reported.  Heparin now stopped.  INR now therapeutic at 2.7  Goal of Therapy:  INR 2 to 3 Monitor platelets by anticoagulation protocol: Yes   Plan:  Repeat Warfarin 7.5 mg po x 1 dose tonight Daily INR  Thank you Anette Guarneri, PharmD

## 2020-02-12 NOTE — Progress Notes (Signed)
PROGRESS NOTE        PATIENT DETAILS Name: Leonard Bentley Age: 59 y.o. Sex: male Date of Birth: August 25, 1961 Admit Date: 01/27/2020 Admitting Physician [name], MD IW:[id number], Vernia Buff, NP  Brief Narrative: Patient is a 59 y.o. male with history of chronic diastolic heart failure, severe MR s/p mitral valve repair, recurrent PE on Coumadin, non-Hodgkin's lymphoma, alcohol abuse recent hospitalization from 3/5-3/12 osteomyelitis-requiring left first and second ray amputation who presented with wound dehiscence (noncompliant to weightbearing status), drainage and newly displaced fracture of the third metatarsal head with underlying osteomyelitis.  Patient was evaluated by orthopedics and underwent left third ray amputation.  Significant events: 3/5-3/12>> admission for left foot osteomyelitis-s/p left first and second ray amputation 3/21>> admit for left foot wound dehiscence-third metatarsal head displaced fracture with possible underlying osteomyelitis   Antimicrobial therapy: Ceftriaxone: 3/21>> 3/24 Vancomycin: 3/21>> 3/25  Microbiology data: 3/21>> blood culture: Negative  Procedures : 3/24>> left third ray amputation  Consults: Orthopedics  DVT Prophylaxis : Full dose anticoagulation with Heparin/Coumadin  Subjective: No major issues overnight-lying comfortably in bed.  Complains of some mild pain in his left earlobe-do not see anything major on exam.  Assessment/Plan: Left foot wound dehiscence with left third metatarsal fracture with possible underlying osteomyelitis-s/p left third ray amputation: Orthopedics following-continue supportive care.  Per orthopedics-needs to be strict nonweightbearing on the left foot.  Hypomagnesemia: Continue to replete and recheck.  Anemia: No evidence of blood loss-I suspect this is secondary to inflammation related to chronic osteomyelitis/wound infection.  Follow for now.  Chronic diastolic heart  failure: Compensated-follow volume status closely  History of severe MR-s/p mitral valve repair with annular ring prosthesis 2017: Stable-outpatient follow-up with cardiology  History of recurrent pulmonary embolism: Was on  overlapping heparin/Coumadin-INR now therapeutic-only on Coumadin.  Pharmacy following.  History of SVT/PACs: Stable-on beta-blocker.  History of tobacco use: Continue transdermal nicotine  History of non-Hodgkin's lymphoma in remission: Follow-up with oncology in the outpatient setting  Diet: Diet Order            Diet Carb Modified Fluid consistency: Thin; Room service appropriate? Yes  Diet effective now              Code Status: Full code  Family Communication: Patient to update family-he is awake and alert.  Disposition Plan:  SNF when ready for discharge  Barriers to Discharge: Awaiting SNF bed-social worker following  Antimicrobial agents: Anti-infectives (From admission, onward)   Start     Dose/Rate Route Frequency Ordered Stop   01/30/20 0830  ceFAZolin (ANCEF) IVPB 2g/100 mL premix  Status:  Discontinued     2 g 200 mL/hr over 30 Minutes Intravenous To Short Stay 01/30/20 0824 01/30/20 1330   01/28/20 1800  cefTRIAXone (ROCEPHIN) 2 g in sodium chloride 0.9 % 100 mL IVPB     2 g 200 mL/hr over 30 Minutes Intravenous Every 24 hours 01/27/20 2211 01/30/20 1900   01/28/20 0600  vancomycin (VANCOCIN) IVPB 1000 mg/200 mL premix     1,000 mg 200 mL/hr over 60 Minutes Intravenous Every 12 hours 01/27/20 1738 01/31/20 1006   01/27/20 1730  vancomycin (VANCOCIN) IVPB 1000 mg/200 mL premix  Status:  Discontinued     1,000 mg 200 mL/hr over 60 Minutes Intravenous  Once 01/27/20 1725 01/27/20 1726   01/27/20 1730  cefTRIAXone (ROCEPHIN) 2  g in sodium chloride 0.9 % 100 mL IVPB     2 g 200 mL/hr over 30 Minutes Intravenous  Once 01/27/20 1725 01/27/20 2010   01/27/20 1730  vancomycin (VANCOREADY) IVPB 2000 mg/400 mL     2,000 mg 200 mL/hr over  120 Minutes Intravenous  Once 01/27/20 1726 01/27/20 2219       Time spent: 15- minutes-Greater than 50% of this time was spent in counseling, explanation of diagnosis, planning of further management, and coordination of care.  MEDICATIONS: Scheduled Meds: . docusate sodium  100 mg Oral BID  . folic acid  1 mg Oral Daily  . metoprolol tartrate  25 mg Oral BID  . multivitamin with minerals  1 tablet Oral Daily  . nicotine  14 mg Transdermal Daily  . thiamine  100 mg Oral Daily  . warfarin  7.5 mg Oral ONCE-1600  . Warfarin - Pharmacist Dosing Inpatient   Does not apply q1800   Continuous Infusions: . lactated ringers 10 mL/hr at 02/04/20 1304   PRN Meds:.acetaminophen **OR** acetaminophen, ALPRAZolam, metoprolol tartrate, morphine injection, ondansetron **OR** ondansetron (ZOFRAN) IV, oxyCODONE   PHYSICAL EXAM: Vital signs: Vitals:   02/11/20 1341 02/11/20 1723 02/11/20 2201 02/12/20 0505  BP: 103/89 (!) 128/96 124/90 (!) 136/99  Pulse: 72 83 78 68  Resp:  18 18 18   Temp: 98.4 F (36.9 C) 98.4 F (36.9 C) 98.4 F (36.9 C) 98.2 F (36.8 C)  TempSrc: Oral Oral Oral Oral  SpO2:  100% 100% 99%  Weight:    91.1 kg  Height:       Filed Weights   02/09/20 0543 02/11/20 0500 02/12/20 0505  Weight: 89 kg 91.8 kg 91.1 kg   Body mass index is 29.66 kg/m.   Gen Exam:Alert awake-not in any distress HEENT:atraumatic, normocephalic Chest: B/L clear to auscultation anteriorly CVS:S1S2 regular Abdomen:soft non tender, non distended Extremities:no edema Neurology: Non focal Skin: no rash  I have personally reviewed following labs and imaging studies  LABORATORY DATA: CBC: Recent Labs  Lab 02/08/20 0324 02/09/20 0437 02/10/20 0536 02/11/20 0401 02/12/20 0052  WBC 3.2* 3.1* 3.7* 3.3* 3.5*  HGB 9.2* 8.4* 9.3* 8.9* 9.3*  HCT 29.5* 26.7* 29.2* 27.4* 29.0*  MCV 84.3 83.7 83.7 83.3 84.5  PLT 243 230 227 249 [id number]    Basic Metabolic Panel: Recent Labs  Lab  02/06/20 0210 02/06/20 0210 02/07/20 0257 02/07/20 0257 02/08/20 0324 02/09/20 0437 02/10/20 0536 02/11/20 0804 02/12/20 0052  NA 135  --  137  --  137 138 137  --   --   K 4.2  --  3.8  --  4.0 3.7 3.8  --   --   CL 101  --  101  --  100 102 102  --   --   CO2 24  --  26  --  26 26 26   --   --   GLUCOSE 109*  --  116*  --  103* 113* 116*  --   --   BUN 8  --  6  --  6 6 7   --   --   CREATININE 0.74  --  0.75  --  0.96 0.75 0.80  --   --   CALCIUM 8.6*  --  9.0  --  9.3 9.2 9.2  --   --   MG 1.6*   < > 1.7   < > 1.9 1.5* 1.6* 1.7 1.9   < > = values in  this interval not displayed.    GFR: Estimated Creatinine Clearance: 109.6 mL/min (by C-G formula based on SCr of 0.8 mg/dL).  Liver Function Tests: No results for input(s): AST, ALT, ALKPHOS, BILITOT, PROT, ALBUMIN in the last 168 hours. No results for input(s): LIPASE, AMYLASE in the last 168 hours. No results for input(s): AMMONIA in the last 168 hours.  Coagulation Profile: Recent Labs  Lab 02/08/20 0324 02/09/20 0437 02/10/20 0536 02/11/20 0401 02/12/20 0052  INR 1.6* 1.7* 2.2* 2.6* 2.7*    Cardiac Enzymes: No results for input(s): CKTOTAL, CKMB, CKMBINDEX, TROPONINI in the last 168 hours.  BNP (last 3 results) No results for input(s): PROBNP in the last 8760 hours.  Lipid Profile: No results for input(s): CHOL, HDL, LDLCALC, TRIG, CHOLHDL, LDLDIRECT in the last 72 hours.  Thyroid Function Tests: No results for input(s): TSH, T4TOTAL, FREET4, T3FREE, THYROIDAB in the last 72 hours.  Anemia Panel: No results for input(s): VITAMINB12, FOLATE, FERRITIN, TIBC, IRON, RETICCTPCT in the last 72 hours.  Urine analysis:    Component Value Date/Time   COLORURINE YELLOW 01/27/2020 1841   APPEARANCEUR CLEAR 01/27/2020 1841   APPEARANCEUR Clear 08/03/2019 1143   LABSPEC 1.015 01/27/2020 1841   PHURINE 6.0 01/27/2020 1841   GLUCOSEU NEGATIVE 01/27/2020 1841   HGBUR SMALL (A) 01/27/2020 1841   BILIRUBINUR NEGATIVE  01/27/2020 1841   BILIRUBINUR small (A) 08/03/2019 1144   BILIRUBINUR Negative 08/03/2019 1143   KETONESUR 5 (A) 01/27/2020 1841   PROTEINUR 30 (A) 01/27/2020 1841   UROBILINOGEN 1.0 08/03/2019 1144   UROBILINOGEN 1.0 01/25/2013 0825   NITRITE NEGATIVE 01/27/2020 1841   LEUKOCYTESUR NEGATIVE 01/27/2020 1841    Sepsis Labs: Lactic Acid, Venous    Component Value Date/Time   LATICACIDVEN 1.2 01/28/2020 0240    MICROBIOLOGY: No results found for this or any previous visit (from the past 240 hour(s)).  RADIOLOGY STUDIES/RESULTS: No results found.   LOS: 16 days   Oren Binet, MD  [facility]    To contact the attending provider between 7A-7P or the covering provider during after hours 7P-7A, please log into the web site [email or web address] and access using universal [place] password for that web site. If you do not have the password, please call the hospital operator.  02/12/2020, 12:17 PM

## 2020-02-13 LAB — CBC
HCT: 29.2 % — ABNORMAL LOW (ref 39.0–52.0)
Hemoglobin: 9 g/dL — ABNORMAL LOW (ref 13.0–17.0)
MCH: 26.2 pg (ref 26.0–34.0)
MCHC: 30.8 g/dL (ref 30.0–36.0)
MCV: 84.9 fL (ref 80.0–100.0)
Platelets: 247 10*3/uL (ref 150–400)
RBC: 3.44 MIL/uL — ABNORMAL LOW (ref 4.22–5.81)
RDW: 15.3 % (ref 11.5–15.5)
WBC: 2.7 10*3/uL — ABNORMAL LOW (ref 4.0–10.5)
nRBC: 0 % (ref 0.0–0.2)

## 2020-02-13 LAB — PROTIME-INR
INR: 2.8 — ABNORMAL HIGH (ref 0.8–1.2)
Prothrombin Time: 29.4 seconds — ABNORMAL HIGH (ref 11.4–15.2)

## 2020-02-13 MED ORDER — LORATADINE 10 MG PO TABS
10.0000 mg | ORAL_TABLET | Freq: Every day | ORAL | Status: DC
  Administered 2020-02-13 – 2020-02-25 (×13): 10 mg via ORAL
  Filled 2020-02-13 (×13): qty 1

## 2020-02-13 MED ORDER — WARFARIN SODIUM 7.5 MG PO TABS
7.5000 mg | ORAL_TABLET | Freq: Every day | ORAL | Status: DC
  Administered 2020-02-13 – 2020-02-24 (×12): 7.5 mg via ORAL
  Filled 2020-02-13 (×11): qty 1

## 2020-02-13 NOTE — Progress Notes (Signed)
PROGRESS NOTE        PATIENT DETAILS Name: Leonard Bentley Age: 59 y.o. Sex: male Date of Birth: Oct 27, 1961 Admit Date: 01/27/2020 Admitting Physician [name], MD IW:[id number], Vernia Buff, NP  Brief Narrative: Patient is a 59 y.o. male with history of chronic diastolic heart failure, severe MR s/p mitral valve repair, recurrent PE on Coumadin, non-Hodgkin's lymphoma, alcohol abuse recent hospitalization from 3/5-3/12 osteomyelitis-requiring left first and second ray amputation who presented with wound dehiscence (noncompliant to weightbearing status), drainage and newly displaced fracture of the third metatarsal head with underlying osteomyelitis.  Patient was evaluated by orthopedics and underwent left third ray amputation.  Significant events: 3/5-3/12>> admission for left foot osteomyelitis-s/p left first and second ray amputation 3/21>> admit for left foot wound dehiscence-third metatarsal head displaced fracture with possible underlying osteomyelitis   Antimicrobial therapy: Ceftriaxone: 3/21>> 3/24 Vancomycin: 3/21>> 3/25  Microbiology data: 3/21>> blood culture: Negative  Procedures : 3/24>> left third ray amputation  Consults: Orthopedics  DVT Prophylaxis : Full dose anticoagulation with Heparin/Coumadin  Subjective: Denies any chest pain, denies any shortness of breath, reports some left ear irritation, nothing major on exam .   Assessment/Plan: Left foot wound dehiscence with left third metatarsal fracture with possible underlying osteomyelitis-s/p left third ray amputation:  - Orthopedics following-continue supportive care.  Per orthopedics-needs to be strict nonweightbearing on the left foot.  Hypomagnesemia:  - Continue to replete and recheck.  Anemia of chronic disease . - no evidence of blood loss-I suspect this is secondary to inflammation related to chronic osteomyelitis/wound infection.  Follow for now.  Chronic diastolic  heart failure: Compensated-follow volume status closely  History of severe MR-s/p mitral valve repair with annular ring prosthesis 2017: Stable-outpatient follow-up with cardiology  History of recurrent pulmonary embolism: Was on  overlapping heparin/Coumadin-INR now therapeutic-only on Coumadin.  Pharmacy following.  History of SVT/PACs: Stable-on beta-blocker.  History of tobacco use: Continue transdermal nicotine  History of non-Hodgkin's lymphoma in remission: Follow-up with oncology in the outpatient setting  Diet: Diet Order            Diet Carb Modified Fluid consistency: Thin; Room service appropriate? Yes  Diet effective now              Code Status: Full code  Family Communication: Patient to update family-he is awake and alert.  Disposition Plan:  SNF when ready for discharge  Barriers to Discharge: Awaiting SNF bed-social worker following, remain strictly left foot nonweightbearing, his house is non livable secondary to his amputation, awaiting for safe disposition plan  Antimicrobial agents: Anti-infectives (From admission, onward)   Start     Dose/Rate Route Frequency Ordered Stop   01/30/20 0830  ceFAZolin (ANCEF) IVPB 2g/100 mL premix  Status:  Discontinued     2 g 200 mL/hr over 30 Minutes Intravenous To Short Stay 01/30/20 0824 01/30/20 1330   01/28/20 1800  cefTRIAXone (ROCEPHIN) 2 g in sodium chloride 0.9 % 100 mL IVPB     2 g 200 mL/hr over 30 Minutes Intravenous Every 24 hours 01/27/20 2211 01/30/20 1900   01/28/20 0600  vancomycin (VANCOCIN) IVPB 1000 mg/200 mL premix     1,000 mg 200 mL/hr over 60 Minutes Intravenous Every 12 hours 01/27/20 1738 01/31/20 1006   01/27/20 1730  vancomycin (VANCOCIN) IVPB 1000 mg/200 mL premix  Status:  Discontinued  1,000 mg 200 mL/hr over 60 Minutes Intravenous  Once 01/27/20 1725 01/27/20 1726   01/27/20 1730  cefTRIAXone (ROCEPHIN) 2 g in sodium chloride 0.9 % 100 mL IVPB     2 g 200 mL/hr over 30 Minutes  Intravenous  Once 01/27/20 1725 01/27/20 2010   01/27/20 1730  vancomycin (VANCOREADY) IVPB 2000 mg/400 mL     2,000 mg 200 mL/hr over 120 Minutes Intravenous  Once 01/27/20 1726 01/27/20 2219        MEDICATIONS: Scheduled Meds: . docusate sodium  100 mg Oral BID  . folic acid  1 mg Oral Daily  . metoprolol tartrate  25 mg Oral BID  . multivitamin with minerals  1 tablet Oral Daily  . nicotine  14 mg Transdermal Daily  . thiamine  100 mg Oral Daily  . warfarin  7.5 mg Oral q1600  . Warfarin - Pharmacist Dosing Inpatient   Does not apply q1800   Continuous Infusions: . lactated ringers 10 mL/hr at 02/04/20 1304   PRN Meds:.acetaminophen **OR** acetaminophen, ALPRAZolam, metoprolol tartrate, morphine injection, ondansetron **OR** ondansetron (ZOFRAN) IV, oxyCODONE   PHYSICAL EXAM: Vital signs: Vitals:   02/12/20 2125 02/13/20 0300 02/13/20 0402 02/13/20 0800  BP: (!) 136/100  (!) 131/97   Pulse: 79  (!) 59   Resp: 16  18   Temp: 98.2 F (36.8 C)  98 F (36.7 C) 98.2 F (36.8 C)  TempSrc: Oral  Oral Oral  SpO2: 100%  100%   Weight:  91.4 kg    Height:       Filed Weights   02/11/20 0500 02/12/20 0505 02/13/20 0300  Weight: 91.8 kg 91.1 kg 91.4 kg   Body mass index is 29.76 kg/m.   Awake Alert, Oriented X 3, No new F.N deficits, Normal affect Symmetrical Chest wall movement, Good air movement bilaterally, CTAB RRR,No Gallops,Rubs or new Murmurs, No Parasternal Heave +ve B.Sounds, Abd Soft, No tenderness, No rebound - guarding or rigidity. No Cyanosis, Clubbing or edema, left bandaged  I have personally reviewed following labs and imaging studies  LABORATORY DATA: CBC: Recent Labs  Lab 02/09/20 0437 02/10/20 0536 02/11/20 0401 02/12/20 0052 02/13/20 0231  WBC 3.1* 3.7* 3.3* 3.5* 2.7*  HGB 8.4* 9.3* 8.9* 9.3* 9.0*  HCT 26.7* 29.2* 27.4* 29.0* 29.2*  MCV 83.7 83.7 83.3 84.5 84.9  PLT 230 227 249 241 [id number]    Basic Metabolic Panel: Recent Labs  Lab  02/07/20 0257 02/07/20 0257 02/08/20 0324 02/09/20 0437 02/10/20 0536 02/11/20 0804 02/12/20 0052  NA 137  --  137 138 137  --   --   K 3.8  --  4.0 3.7 3.8  --   --   CL 101  --  100 102 102  --   --   CO2 26  --  26 26 26   --   --   GLUCOSE 116*  --  103* 113* 116*  --   --   BUN 6  --  6 6 7   --   --   CREATININE 0.75  --  0.96 0.75 0.80  --   --   CALCIUM 9.0  --  9.3 9.2 9.2  --   --   MG 1.7   < > 1.9 1.5* 1.6* 1.7 1.9   < > = values in this interval not displayed.    GFR: Estimated Creatinine Clearance: 109.7 mL/min (by C-G formula based on SCr of 0.8 mg/dL).  Liver Function Tests: No  results for input(s): AST, ALT, ALKPHOS, BILITOT, PROT, ALBUMIN in the last 168 hours. No results for input(s): LIPASE, AMYLASE in the last 168 hours. No results for input(s): AMMONIA in the last 168 hours.  Coagulation Profile: Recent Labs  Lab 02/09/20 0437 02/10/20 0536 02/11/20 0401 02/12/20 0052 02/13/20 0231  INR 1.7* 2.2* 2.6* 2.7* 2.8*    Cardiac Enzymes: No results for input(s): CKTOTAL, CKMB, CKMBINDEX, TROPONINI in the last 168 hours.  BNP (last 3 results) No results for input(s): PROBNP in the last 8760 hours.  Lipid Profile: No results for input(s): CHOL, HDL, LDLCALC, TRIG, CHOLHDL, LDLDIRECT in the last 72 hours.  Thyroid Function Tests: No results for input(s): TSH, T4TOTAL, FREET4, T3FREE, THYROIDAB in the last 72 hours.  Anemia Panel: No results for input(s): VITAMINB12, FOLATE, FERRITIN, TIBC, IRON, RETICCTPCT in the last 72 hours.  Urine analysis:    Component Value Date/Time   COLORURINE YELLOW 01/27/2020 1841   APPEARANCEUR CLEAR 01/27/2020 1841   APPEARANCEUR Clear 08/03/2019 1143   LABSPEC 1.015 01/27/2020 1841   PHURINE 6.0 01/27/2020 1841   GLUCOSEU NEGATIVE 01/27/2020 1841   HGBUR SMALL (A) 01/27/2020 1841   BILIRUBINUR NEGATIVE 01/27/2020 1841   BILIRUBINUR small (A) 08/03/2019 1144   BILIRUBINUR Negative 08/03/2019 1143   KETONESUR 5  (A) 01/27/2020 1841   PROTEINUR 30 (A) 01/27/2020 1841   UROBILINOGEN 1.0 08/03/2019 1144   UROBILINOGEN 1.0 01/25/2013 0825   NITRITE NEGATIVE 01/27/2020 1841   LEUKOCYTESUR NEGATIVE 01/27/2020 1841    Sepsis Labs: Lactic Acid, Venous    Component Value Date/Time   LATICACIDVEN 1.2 01/28/2020 0240    MICROBIOLOGY: No results found for this or any previous visit (from the past 240 hour(s)).  RADIOLOGY STUDIES/RESULTS: No results found.   LOS: 13 days   Phillips Climes, MD  [facility]    To contact the attending provider between 7A-7P or the covering provider during after hours 7P-7A, please log into the web site [email or web address] and access using universal [place] password for that web site. If you do not have the password, please call the hospital operator.  02/13/2020, 4:15 PM

## 2020-02-13 NOTE — Progress Notes (Signed)
Occupational Therapy Treatment Patient Details Name: Leonard Bentley MRN: [id number] DOB: [birth year] Today's Date: 02/13/2020    History of present illness 59 y.o. male with what a recent left foot first and second ray amputation for osteomyelitis and discharged on March 10/27/2020 with history of minimally invasive mitral valve repair, chronic combined systolic and diastolic CHF last EF is improved to 55 to 60%, alcohol abuse, non-Hodgkin's lymphoma stage II in remission presents to the ER with complaint of increasing pain and discharge from the left foot. following L foot first and 2nd ray amputation.   X-rays show fracture involving the left third metatarsal concerning for osteomyelitis. Admitted 01/27/20 for amputation revision sx and s/p 3rd ray amputation on 01/30/20.   OT comments  Pt progressing well with OT goals and continues to be motivated to participate in therapy. Pt Supervision for sit to stand transfers and continues to be min guard for stand pivot and ambulation using RW to maintain balance. Pt with improving safety awareness, but still requires cues for pacing, as pt tends to overexert self. Pt ambulated halfway down hallway today with RW at min guard. Pt min guard for LB dressing to don pants with min guard to ensure balance in standing during task. Guided pt in additional dynamic standing balance challenge with RW and one-handed support reaching outside of BOS and across midline at min guard, no LOB. Pt wanted to demonstrate UE HEP to OT using blue theraband - provided additional feedback and education on proper body mechanics to maximize strength and safety.     Follow Up Recommendations  SNF;Supervision/Assistance - 24 hour    Equipment Recommendations  3 in 1 bedside commode;Other (comment)(RW)    Recommendations for Other Services      Precautions / Restrictions Precautions Precautions: Fall Restrictions Weight Bearing Restrictions: Yes LLE Weight Bearing: Non weight bearing        Mobility Bed Mobility Overal bed mobility: Independent                Transfers Overall transfer level: Needs assistance Equipment used: Rolling walker (2 wheeled) Transfers: Sit to/from [facility] Sit to Stand: Supervision Stand pivot transfers: Min guard            Balance     Sitting balance-Leahy Scale: Normal       Standing balance-Leahy Scale: Fair                             ADL either performed or assessed with clinical judgement   ADL Overall ADL's : Needs assistance/impaired                     Lower Body Dressing: Min guard;Sit to/from stand Lower Body Dressing Details (indicate cue type and reason): min guard for LB dressing simulation, improved balance from last trial, but with shakiness in standing during task              Functional mobility during ADLs: Min guard;Rolling walker General ADL Comments: Improved safety awareness, continues to require cues for pacing self      Vision       Perception     Praxis      Cognition Arousal/Alertness: Awake/alert Behavior During Therapy: WFL for tasks assessed/performed Overall Cognitive Status: Within Functional Limits for tasks assessed  Exercises Exercises: General Upper Extremity General Exercises - Upper Extremity Shoulder Flexion: Strengthening;Both;15 reps;Theraband Theraband Level (Shoulder Flexion): Level 4 (Blue) Shoulder Horizontal ABduction: Strengthening;Both;15 reps;Theraband Theraband Level (Shoulder Horizontal Abduction): Level 4 (Blue) Elbow Flexion: Strengthening;Both;15 reps;Theraband Theraband Level (Elbow Flexion): Level 4 (Blue) Elbow Extension: Strengthening;Both;15 reps;Theraband Theraband Level (Elbow Extension): Level 4 (Blue)   Shoulder Instructions       General Comments Guided pt in additional dynamic standing balance training with RW, one handed support  and reaching outside of BOS. Assisted pt in effective completion of UE HEP with blue theraband     Pertinent Vitals/ Pain       Pain Assessment: No/denies pain Pain Intervention(s): Monitored during session  Home Living                                          Prior Functioning/Environment              Frequency  Min 2X/week        Progress Toward Goals  OT Goals(current goals can now be found in the care plan section)  Progress towards OT goals: Progressing toward goals  Acute Rehab OT Goals Patient Stated Goal: to be able to walker further, to improve OT Goal Formulation: With patient Time For Goal Achievement: 02/21/20 Potential to Achieve Goals: Good ADL Goals Pt Will Perform Grooming: standing;with set-up Pt Will Perform Lower Body Bathing: sit to/from stand;sitting/lateral leans;with set-up Pt Will Perform Lower Body Dressing: with set-up;sit to/from stand;sitting/lateral leans Pt Will Transfer to Toilet: ambulating;regular height toilet;with modified independence Pt Will Perform Toileting - Clothing Manipulation and hygiene: with modified independence;sitting/lateral leans;sit to/from stand  Plan Discharge plan remains appropriate;Frequency remains appropriate    Co-evaluation                 AM-PAC OT "6 Clicks" Daily Activity     Outcome Measure   Help from another person eating meals?: None Help from another person taking care of personal grooming?: A Little Help from another person toileting, which includes using toliet, bedpan, or urinal?: A Little Help from another person bathing (including washing, rinsing, drying)?: A Little Help from another person to put on and taking off regular upper body clothing?: None Help from another person to put on and taking off regular lower body clothing?: A Little 6 Click Score: 20    End of Session Equipment Utilized During Treatment: Gait belt;Rolling walker  OT Visit Diagnosis:  Unsteadiness on feet (R26.81);Other abnormalities of gait and mobility (R26.89);Pain   Activity Tolerance Patient tolerated treatment well   Patient Left in bed;with call bell/phone within reach   Nurse Communication          Time: 1105-1150 OT Time Calculation (min): 45 min  Charges: OT General Charges $OT Visit: 1 Visit OT Treatments $Self Care/Home Management : 8-22 mins $Therapeutic Activity: 8-22 mins $Therapeutic Exercise: 8-22 mins  Layla Maw, OTR/L   Layla Maw 02/13/2020, 2:32 PM

## 2020-02-13 NOTE — TOC Progression Note (Signed)
Transition of Care [facility]) - Progression Note    Patient Details  Name: Leonard Bentley MRN: [id number] Date of Birth: [birth year]  Transition of Care [facility]) CM/SW Contact  Ralph Dowdy, [employer] Work Phone Number: 02/13/2020, 10:03 AM  Clinical Narrative:    CSW spoke with patient regarding bed offers. CSW said that there are no bed offers at this time, and since he is walking and progressing more physically that skilled nursing is no longer an option. CSW asked about clarification regarding going to [place] for housing. Pt said that his daughters mom has a place that he may can stay in. CSW encouraged this as there is limited housing options at this time for patient. Pt said that his current housing isn't very livable considering his physical conditions. Pt said that he is trying to apply for medicare and contacted DSS. CSW tried to encourage patient and provide motivational interviewing techniques when speaking to patient since he is discouraged. All other questions were answered at this time. CSW/LCSW will continue to follow.      Barriers to Discharge: Continued Medical Work up, SNF Pending bed offer, SNF Pending payor source - LOG  Expected Discharge Plan and Services   In-house Referral: Clinical Social Work, Risk manager Acute Care Choice: [place] Living arrangements for the past 2 months: [place] Determinants of Health (SDOH) Interventions    Readmission Risk Interventions Readmission Risk Prevention Plan 01/31/2020 01/16/2020 01/18/2019  Transportation Screening Complete Complete Complete  PCP or Specialist Appt within 5-7 Days Complete Complete -  Home Care Screening Complete Complete -  Medication Review (RN CM) Complete Complete -  Medication Review ([place]) - - Complete  HRI or [place] - - Complete  [place] - - Not Applicable  Some recent data might be hidden

## 2020-02-13 NOTE — Progress Notes (Signed)
ANTICOAGULATION CONSULT NOTE - Follow Up Consult  Pharmacy Consult for Warfarin Indication: hx recurrent PE  Allergies  Allergen Reactions  . Other Other (See Comments)    "Cactus" caused blisters on tongue (if prepared as food)    Patient Measurements: Height: 5\' 9"  (175.3 cm)(office visit 01/23/20) Weight: 91.4 kg (201 lb 8 oz) IBW/kg (Calculated) : 70.7 Heparin Dosing Weight: 89 kg  Vital Signs: Temp: 98 F (36.7 C) (04/07 0402) Temp Source: Oral (04/07 0402) BP: 131/97 (04/07 0402) Pulse Rate: 59 (04/07 0402)  Labs: Recent Labs    02/11/20 0401 02/11/20 0401 02/12/20 0052 02/13/20 0231  HGB 8.9*   < > 9.3* 9.0*  HCT 27.4*  --  29.0* 29.2*  PLT 249  --  241 247  LABPROT 27.7*  --  29.0* 29.4*  INR 2.6*  --  2.7* 2.8*  HEPARINUNFRC 0.51  --   --   --    < > = values in this interval not displayed.    Estimated Creatinine Clearance: 109.7 mL/min (by C-G formula based on SCr of 0.8 mg/dL).  Assessment: 59 yr old male on Warfarin PTA for hx recurrent PE (04/2016).  INR supratherapeutic (3.8) on admit 3/21.  Warfarin held and Vitamin K 2.5 mg IV given on 3/22 for 3rd ray amputation on 3/24.  Warfarin resumed on 3/25 with Heparin bridge. Heparin held on 3/29 am after he bumped foot and had copious bleeding. Resumed that afternoon after bleeding has decreased. No further bleeding reported.  Heparin now stopped.  INR now therapeutic at 2.8  Goal of Therapy:  INR 2 to 3 Monitor platelets by anticoagulation protocol: Yes   Plan:  Try Warfarin 7.5 mg po daily for now Daily INR  Thank you Anette Guarneri, PharmD

## 2020-02-14 LAB — CBC
HCT: 28.4 % — ABNORMAL LOW (ref 39.0–52.0)
Hemoglobin: 9 g/dL — ABNORMAL LOW (ref 13.0–17.0)
MCH: 26.5 pg (ref 26.0–34.0)
MCHC: 31.7 g/dL (ref 30.0–36.0)
MCV: 83.8 fL (ref 80.0–100.0)
Platelets: 244 10*3/uL (ref 150–400)
RBC: 3.39 MIL/uL — ABNORMAL LOW (ref 4.22–5.81)
RDW: 15.1 % (ref 11.5–15.5)
WBC: 3.1 10*3/uL — ABNORMAL LOW (ref 4.0–10.5)
nRBC: 0 % (ref 0.0–0.2)

## 2020-02-14 LAB — PROTIME-INR
INR: 2.6 — ABNORMAL HIGH (ref 0.8–1.2)
Prothrombin Time: 28.1 seconds — ABNORMAL HIGH (ref 11.4–15.2)

## 2020-02-14 NOTE — Progress Notes (Signed)
PROGRESS NOTE        PATIENT DETAILS Name: Leonard Bentley Age: 59 y.o. Sex: male Date of Birth: 09/10/1961 Admit Date: 01/27/2020 Admitting Physician [name], MD [id number], Vernia Buff, NP  Brief Narrative: Patient is a 59 y.o. male with history of chronic diastolic heart failure, severe MR s/p mitral valve repair, recurrent PE on Coumadin, non-Hodgkin's lymphoma, alcohol abuse recent hospitalization from 3/5-3/12 osteomyelitis-requiring left first and second ray amputation who presented with wound dehiscence (noncompliant to weightbearing status), drainage and newly displaced fracture of the third metatarsal head with underlying osteomyelitis.  Patient was evaluated by orthopedics and underwent left third ray amputation.  Significant events: 3/5-3/12>> admission for left foot osteomyelitis-s/p left first and second ray amputation 3/21>> admit for left foot wound dehiscence-third metatarsal head displaced fracture with possible underlying osteomyelitis   Antimicrobial therapy: Ceftriaxone: 3/21>> 3/24 Vancomycin: 3/21>> 3/25  Microbiology data: 3/21>> blood culture: Negative  Procedures : 3/24>> left third ray amputation  Consults: Orthopedics  DVT Prophylaxis : Full dose anticoagulation with Heparin/Coumadin  Subjective: Denies any chest pain, denies any shortness of breath.   Assessment/Plan: Left foot wound dehiscence with left third metatarsal fracture with possible underlying osteomyelitis-s/p left third ray amputation:  - Orthopedics following-continue supportive care.  Per orthopedics-needs to be strict nonweightbearing on the left foot.  Hypomagnesemia:  - repleted.  Anemia of chronic disease . - no evidence of blood loss-I suspect this is secondary to inflammation related to chronic osteomyelitis/wound infection.  Follow for now.  Chronic diastolic heart failure: Compensated-follow volume status closely  History of severe  MR-s/p mitral valve repair with annular ring prosthesis 2017: Stable-outpatient follow-up with cardiology  History of recurrent pulmonary embolism: Was on  overlapping heparin/Coumadin-INR now therapeutic-only on Coumadin.  Pharmacy following.  History of SVT/PACs: Stable-on beta-blocker.  History of tobacco use: Continue transdermal nicotine  History of non-Hodgkin's lymphoma in remission: Follow-up with oncology in the outpatient setting  Diet: Diet Order            Diet Carb Modified Fluid consistency: Thin; Room service appropriate? Yes  Diet effective now              Code Status: Full code  Family Communication: Patient to update family-he is awake and alert.  Disposition Plan:  SNF when ready for discharge  Barriers to Discharge: Awaiting SNF bed-social worker following, remain strictly left foot nonweightbearing, his house is non livable secondary to his amputation, awaiting for safe disposition plan  Antimicrobial agents: Anti-infectives (From admission, onward)   Start     Dose/Rate Route Frequency Ordered Stop   01/30/20 0830  ceFAZolin (ANCEF) IVPB 2g/100 mL premix  Status:  Discontinued     2 g 200 mL/hr over 30 Minutes Intravenous To Short Stay 01/30/20 0824 01/30/20 1330   01/28/20 1800  cefTRIAXone (ROCEPHIN) 2 g in sodium chloride 0.9 % 100 mL IVPB     2 g 200 mL/hr over 30 Minutes Intravenous Every 24 hours 01/27/20 2211 01/30/20 1900   01/28/20 0600  vancomycin (VANCOCIN) IVPB 1000 mg/200 mL premix     1,000 mg 200 mL/hr over 60 Minutes Intravenous Every 12 hours 01/27/20 1738 01/31/20 1006   01/27/20 1730  vancomycin (VANCOCIN) IVPB 1000 mg/200 mL premix  Status:  Discontinued     1,000 mg 200 mL/hr over 60 Minutes Intravenous  Once 01/27/20  1725 01/27/20 1726   01/27/20 1730  cefTRIAXone (ROCEPHIN) 2 g in sodium chloride 0.9 % 100 mL IVPB     2 g 200 mL/hr over 30 Minutes Intravenous  Once 01/27/20 1725 01/27/20 2010   01/27/20 1730  vancomycin  (VANCOREADY) IVPB 2000 mg/400 mL     2,000 mg 200 mL/hr over 120 Minutes Intravenous  Once 01/27/20 1726 01/27/20 2219        MEDICATIONS: Scheduled Meds: . docusate sodium  100 mg Oral BID  . folic acid  1 mg Oral Daily  . loratadine  10 mg Oral Daily  . metoprolol tartrate  25 mg Oral BID  . multivitamin with minerals  1 tablet Oral Daily  . nicotine  14 mg Transdermal Daily  . thiamine  100 mg Oral Daily  . warfarin  7.5 mg Oral q1600  . Warfarin - Pharmacist Dosing Inpatient   Does not apply q1800   Continuous Infusions: . lactated ringers 10 mL/hr at 02/04/20 1304   PRN Meds:.acetaminophen **OR** acetaminophen, ALPRAZolam, metoprolol tartrate, morphine injection, ondansetron **OR** ondansetron (ZOFRAN) IV, oxyCODONE   PHYSICAL EXAM: Vital signs: Vitals:   02/13/20 2123 02/14/20 0519 02/14/20 0528 02/14/20 1237  BP: (!) 125/93 (!) 121/93  (!) 115/92  Pulse: 78 80  65  Resp: 17 17  17   Temp: 98.4 F (36.9 C) 98.5 F (36.9 C)  98.5 F (36.9 C)  TempSrc: Oral Oral  Oral  SpO2: 100% 99%  100%  Weight:   91.2 kg   Height:       Filed Weights   02/12/20 0505 02/13/20 0300 02/14/20 0528  Weight: 91.1 kg 91.4 kg 91.2 kg   Body mass index is 29.69 kg/m.   Awake Alert, Oriented X 3, No new F.N deficits, Normal affect Symmetrical Chest wall movement, Good air movement bilaterally, CTAB RRR,No Gallops,Rubs or new Murmurs, No Parasternal Heave Abdomen soft, nontender No Cyanosis, Clubbing or edema, left bandaged  I have personally reviewed following labs and imaging studies  LABORATORY DATA: CBC: Recent Labs  Lab 02/10/20 0536 02/11/20 0401 02/12/20 0052 02/13/20 0231 02/14/20 0213  WBC 3.7* 3.3* 3.5* 2.7* 3.1*  HGB 9.3* 8.9* 9.3* 9.0* 9.0*  HCT 29.2* 27.4* 29.0* 29.2* 28.4*  MCV 83.7 83.3 84.5 84.9 83.8  PLT 227 249 241 247 [id number]    Basic Metabolic Panel: Recent Labs  Lab 02/08/20 0324 02/09/20 0437 02/10/20 0536 02/11/20 0804 02/12/20 0052  NA  137 138 137  --   --   K 4.0 3.7 3.8  --   --   CL 100 102 102  --   --   CO2 26 26 26   --   --   GLUCOSE 103* 113* 116*  --   --   BUN 6 6 7   --   --   CREATININE 0.96 0.75 0.80  --   --   CALCIUM 9.3 9.2 9.2  --   --   MG 1.9 1.5* 1.6* 1.7 1.9    GFR: Estimated Creatinine Clearance: 109.6 mL/min (by C-G formula based on SCr of 0.8 mg/dL).  Liver Function Tests: No results for input(s): AST, ALT, ALKPHOS, BILITOT, PROT, ALBUMIN in the last 168 hours. No results for input(s): LIPASE, AMYLASE in the last 168 hours. No results for input(s): AMMONIA in the last 168 hours.  Coagulation Profile: Recent Labs  Lab 02/10/20 0536 02/11/20 0401 02/12/20 0052 02/13/20 0231 02/14/20 0213  INR 2.2* 2.6* 2.7* 2.8* 2.6*    Cardiac Enzymes:  No results for input(s): CKTOTAL, CKMB, CKMBINDEX, TROPONINI in the last 168 hours.  BNP (last 3 results) No results for input(s): PROBNP in the last 8760 hours.  Lipid Profile: No results for input(s): CHOL, HDL, LDLCALC, TRIG, CHOLHDL, LDLDIRECT in the last 72 hours.  Thyroid Function Tests: No results for input(s): TSH, T4TOTAL, FREET4, T3FREE, THYROIDAB in the last 72 hours.  Anemia Panel: No results for input(s): VITAMINB12, FOLATE, FERRITIN, TIBC, IRON, RETICCTPCT in the last 72 hours.  Urine analysis:    Component Value Date/Time   COLORURINE YELLOW 01/27/2020 1841   APPEARANCEUR CLEAR 01/27/2020 1841   APPEARANCEUR Clear 08/03/2019 1143   LABSPEC 1.015 01/27/2020 1841   PHURINE 6.0 01/27/2020 1841   GLUCOSEU NEGATIVE 01/27/2020 1841   HGBUR SMALL (A) 01/27/2020 1841   BILIRUBINUR NEGATIVE 01/27/2020 1841   BILIRUBINUR small (A) 08/03/2019 1144   BILIRUBINUR Negative 08/03/2019 1143   KETONESUR 5 (A) 01/27/2020 1841   PROTEINUR 30 (A) 01/27/2020 1841   UROBILINOGEN 1.0 08/03/2019 1144   UROBILINOGEN 1.0 01/25/2013 0825   NITRITE NEGATIVE 01/27/2020 1841   LEUKOCYTESUR NEGATIVE 01/27/2020 1841    Sepsis Labs: Lactic Acid,  Venous    Component Value Date/Time   LATICACIDVEN 1.2 01/28/2020 0240    MICROBIOLOGY: No results found for this or any previous visit (from the past 240 hour(s)).  RADIOLOGY STUDIES/RESULTS: No results found.   LOS: 37 days   Phillips Climes, MD  [facility]    To contact the attending provider between 7A-7P or the covering provider during after hours 7P-7A, please log into the web site [email or web address] and access using universal [place] password for that web site. If you do not have the password, please call the hospital operator.  02/14/2020, 2:45 PM

## 2020-02-14 NOTE — Progress Notes (Signed)
ANTICOAGULATION CONSULT NOTE - Follow Up Consult  Pharmacy Consult for Warfarin Indication: hx recurrent PE  Allergies  Allergen Reactions  . Other Other (See Comments)    "Cactus" caused blisters on tongue (if prepared as food)    Patient Measurements: Height: 5\' 9"  (175.3 cm)(office visit 01/23/20) Weight: 91.2 kg (201 lb 1 oz) IBW/kg (Calculated) : 70.7 Heparin Dosing Weight: 89 kg  Vital Signs: Temp: 98.5 F (36.9 C) (04/08 1237) Temp Source: Oral (04/08 1237) BP: 115/92 (04/08 1237) Pulse Rate: 65 (04/08 1237)  Labs: Recent Labs    02/12/20 0052 02/12/20 0052 02/13/20 0231 02/14/20 0213  HGB 9.3*   < > 9.0* 9.0*  HCT 29.0*  --  29.2* 28.4*  PLT 241  --  247 244  LABPROT 29.0*  --  29.4* 28.1*  INR 2.7*  --  2.8* 2.6*   < > = values in this interval not displayed.    Estimated Creatinine Clearance: 109.6 mL/min (by C-G formula based on SCr of 0.8 mg/dL).  Assessment: 59 yr old male on Warfarin PTA for hx recurrent PE (04/2016).  INR supratherapeutic (3.8) on admit 3/21.  Warfarin held and Vitamin K 2.5 mg IV given on 3/22 for 3rd ray amputation on 3/24.  Warfarin resumed on 3/25 with Heparin bridge. Heparin held on 3/29 am after he bumped foot and had copious bleeding. Resumed that afternoon after bleeding has decreased. No further bleeding reported.  Heparin now stopped.  INR now therapeutic at 2.6. We will change INR to MWF  Goal of Therapy:  INR 2 to 3 Monitor platelets by anticoagulation protocol: Yes   Plan:  Warfarin 7.5 mg po daily INR MWF  Onnie Boer, PharmD, BCIDP, AAHIVP, CPP Infectious Disease Pharmacist 02/14/2020 12:52 PM

## 2020-02-15 LAB — PROTIME-INR
INR: 2.3 — ABNORMAL HIGH (ref 0.8–1.2)
Prothrombin Time: 25.1 seconds — ABNORMAL HIGH (ref 11.4–15.2)

## 2020-02-15 NOTE — TOC Progression Note (Signed)
Transition of Care [facility]) - Progression Note    Patient Details  Name: Leonard Bentley MRN: [id number] Date of Birth: [birth year]  Transition of Care [facility]) CM/SW [place], LCSW Phone Number: 02/15/2020, 10:16 AM  Clinical Narrative:    CSW continuing to follow.      Barriers to Discharge: Continued Medical Work up, SNF Pending bed offer, SNF Pending payor source - LOG  Expected Discharge Plan and Services   In-house Referral: Clinical Social Work, Risk manager Acute Care Choice: [place] Living arrangements for the past 2 months: [place] Determinants of Health (SDOH) Interventions    Readmission Risk Interventions Readmission Risk Prevention Plan 01/31/2020 01/16/2020 01/18/2019  Transportation Screening Complete Complete Complete  PCP or Specialist Appt within 5-7 Days Complete Complete -  Home Care Screening Complete Complete -  Medication Review (RN CM) Complete Complete -  Medication Review ([place]) - - Complete  HRI or [place] - - Complete  [place] - - Not Applicable  Some recent data might be hidden

## 2020-02-15 NOTE — Progress Notes (Signed)
ANTICOAGULATION CONSULT NOTE - Follow Up Consult  Pharmacy Consult for Warfarin Indication: hx recurrent PE  Allergies  Allergen Reactions  . Other Other (See Comments)    "Cactus" caused blisters on tongue (if prepared as food)    Patient Measurements: Height: 5\' 9"  (175.3 cm)(office visit 01/23/20) Weight: 89 kg (196 lb 4.8 oz) IBW/kg (Calculated) : 70.7 Heparin Dosing Weight: 89 kg  Vital Signs: Temp: 97.8 F (36.6 C) (04/09 0437) Temp Source: Oral (04/09 0437) BP: 123/96 (04/09 0437) Pulse Rate: 56 (04/09 0437)  Labs: Recent Labs    02/13/20 0231 02/14/20 0213 02/15/20 0416  HGB 9.0* 9.0*  --   HCT 29.2* 28.4*  --   PLT 247 244  --   LABPROT 29.4* 28.1* 25.1*  INR 2.8* 2.6* 2.3*    Estimated Creatinine Clearance: 108.3 mL/min (by C-G formula based on SCr of 0.8 mg/dL).  Assessment: 59 yr old male on Warfarin PTA for hx recurrent PE (04/2016).  INR supratherapeutic (3.8) on admit 3/21.  Warfarin held and Vitamin K 2.5 mg IV given on 3/22 for 3rd ray amputation on 3/24.  Warfarin resumed on 3/25 with Heparin bridge. Heparin held on 3/29 am after he bumped foot and had copious bleeding. Resumed that afternoon after bleeding has decreased. No further bleeding reported.  Heparin now stopped.  INR now therapeutic at 2.3,  We will change INR to MWF  Goal of Therapy:  INR 2 to 3 Monitor platelets by anticoagulation protocol: Yes   Plan:  Warfarin 7.5 mg po daily INR MWF - double check INR Saturday  Thank you Anette Guarneri, PharmD [phone] 02/15/2020 9:20 AM

## 2020-02-15 NOTE — Progress Notes (Signed)
Physical Therapy Treatment Patient Details Name: Leonard Bentley MRN: [id number] DOB: [birth year] Today's Date: 02/15/2020    History of Present Illness 59 y.o. male with what a recent left foot first and second ray amputation for osteomyelitis and discharged on March 10/27/2020 with history of minimally invasive mitral valve repair, chronic combined systolic and diastolic CHF last EF is improved to 55 to 60%, alcohol abuse, non-Hodgkin's lymphoma stage II in remission presents to the ER with complaint of increasing pain and discharge from the left foot. following L foot first and 2nd ray amputation.   X-rays show fracture involving the left third metatarsal concerning for osteomyelitis. Admitted 01/27/20 for amputation revision sx and s/Bentley 3rd ray amputation on 01/30/20.    PT Comments    Pt standing performing exercises on arrival with noted diaphoresis. Pt reports he has been doing repeated sets of exercises and moving around in room today. Pt self reports he is used to being an athlete and pushing himself but also noted that he fell in [place] few months ago and was unable to get up. Pt continues to struggle with recognizing fatigue and activity modification. He is doing an excellent job of maintaining NWB status. Discussed knee walker and pt reports this would be helpful for in home or community mobility and he will look into purchasing one. Pt still has no way to enter home without stairs and does not have sufficient railing or other assist to enter home. Pt required a time limit to be able to force himself to stop activity and exercise to recover. Even with a request for 1 hour recovery pt was already planning at the hour how he would start on bil UE exercises.   If ramp could be obtained and home health services there could be reason for return home.    Follow Up Recommendations  SNF, if he could receive ramp home would be possible     Equipment Recommendations  None recommended by PT     Recommendations for Other Services       Precautions / Restrictions Precautions Precautions: Fall Restrictions LLE Weight Bearing: Non weight bearing    Mobility  Bed Mobility               General bed mobility comments: Pt standing on arrival  Transfers Overall transfer level: Modified independent               General transfer comment: pt able to sit and stand x 2 without assist  Ambulation/Gait Ambulation/Gait assistance: Min guard Gait Distance (Feet): 160 Feet Assistive device: Rolling walker (2 wheeled) Gait Pattern/deviations: Step-to pattern   Gait velocity interpretation: >2.62 ft/sec, indicative of community ambulatory General Gait Details: pt maintains NWB well.  He likely pushs himself too far, showing a little more tremor as he fatigues. Mod cues for self regulation, fatigue recognition, pausing to rest during gait x 3 only when cues. Cues for posture and shoulder depression   Stairs             Wheelchair Mobility    Modified Rankin (Stroke Patients Only)       Balance Overall balance assessment: Needs assistance   Sitting balance-Leahy Scale: Normal     Standing balance support: Bilateral upper extremity supported;During functional activity   Standing balance comment: bil UE support to maintain NWB status  Cognition Arousal/Alertness: Awake/alert Behavior During Therapy: WFL for tasks assessed/performed Overall Cognitive Status: Impaired/Different from baseline Area of Impairment: Safety/judgement                         Safety/Judgement: Decreased awareness of deficits     General Comments: pt has great difficulty with pacing and self regulation. Per pt report he is an "excessive athlete" and can't sit still. Pt even with cues cannot recognize fatigue despite perspiration and trembling bil UE. Cues for safety and needs time markers to stop activity      Exercises General  Exercises - Lower Extremity Long Arc Quad: AROM;10 reps;Both;Seated Hip ABduction/ADduction: AROM;Seated;Both;20 reps Hip Flexion/Marching: AROM;Both;20 reps;Standing Other Exercises Other Exercises: standing dips/mini squat on RLE in RW x 20    General Comments        Pertinent Vitals/Pain Pain Assessment: No/denies pain    Home Living                      Prior Function            PT Goals (current goals can now be found in the care plan section) Progress towards PT goals: Progressing toward goals    Frequency    Min 2X/week      PT Plan Current plan remains appropriate    Co-evaluation              AM-PAC PT "6 Clicks" Mobility   Outcome Measure  Help needed turning from your back to your side while in a flat bed without using bedrails?: None Help needed moving from lying on your back to sitting on the side of a flat bed without using bedrails?: None Help needed moving to and from a bed to a chair (including a wheelchair)?: None Help needed standing up from a chair using your arms (e.g., wheelchair or bedside chair)?: None Help needed to walk in hospital room?: A Little Help needed climbing 3-5 steps with a railing? : A Lot 6 Click Score: 21    End of Session   Activity Tolerance: Patient tolerated treatment well Patient left: with call bell/phone within reach;in bed Nurse Communication: Mobility status PT Visit Diagnosis: Other abnormalities of gait and mobility (R26.89)     Time: [id number] PT Time Calculation (min) (ACUTE ONLY): 28 min  Charges:  $Gait Training: 8-22 mins $Therapeutic Activity: 8-22 mins                     Leonard Bentley, PT Acute Rehabilitation Services Pager: [phone] Office: [phone]    Leonard Bentley 02/15/2020, 1:50 PM

## 2020-02-15 NOTE — Progress Notes (Signed)
PROGRESS NOTE        PATIENT DETAILS Name: Leonard Bentley Age: 59 y.o. Sex: male Date of Birth: 27-Nov-1960 Admit Date: 01/27/2020 Admitting Physician [name], MD [id number], Vernia Buff, NP  Brief Narrative: Patient is a 59 y.o. male with history of chronic diastolic heart failure, severe MR s/p mitral valve repair, recurrent PE on Coumadin, non-Hodgkin's lymphoma, alcohol abuse recent hospitalization from 3/5-3/12 osteomyelitis-requiring left first and second ray amputation who presented with wound dehiscence (noncompliant to weightbearing status), drainage and newly displaced fracture of the third metatarsal head with underlying osteomyelitis.  Patient was evaluated by orthopedics and underwent left third ray amputation.  Significant events: 3/5-3/12>> admission for left foot osteomyelitis-s/p left first and second ray amputation 3/21>> admit for left foot wound dehiscence-third metatarsal head displaced fracture with possible underlying osteomyelitis   Antimicrobial therapy: Ceftriaxone: 3/21>> 3/24 Vancomycin: 3/21>> 3/25  Microbiology data: 3/21>> blood culture: Negative  Procedures : 3/24>> left third ray amputation  Consults: Orthopedics  DVT Prophylaxis : Full dose anticoagulation with Heparin/Coumadin  Subjective: Denies any chest pain, denies any shortness of breath.  Patient has been working with staff to improve his physical activity.   Assessment/Plan:  Left foot wound dehiscence with left third metatarsal fracture with possible underlying osteomyelitis-s/p left third ray amputation:  - Orthopedics following-continue supportive care.  Per orthopedics-needs to be strict nonweightbearing on the left foot.  Hypomagnesemia:  - repleted.  Anemia of chronic disease . - no evidence of blood loss-I suspect this is secondary to inflammation related to chronic osteomyelitis/wound infection.  Follow for now.  Chronic diastolic heart  failure: Compensated-follow volume status closely  History of severe MR-s/p mitral valve repair with annular ring prosthesis 2017: Stable-outpatient follow-up with cardiology  History of recurrent pulmonary embolism: Was on  overlapping heparin/Coumadin-INR now therapeutic-only on Coumadin.  Pharmacy following.  History of SVT/PACs: Stable-on beta-blocker.  History of tobacco use: Continue transdermal nicotine  History of non-Hodgkin's lymphoma in remission: Follow-up with oncology in the outpatient setting  Diet: Diet Order            Diet Carb Modified Fluid consistency: Thin; Room service appropriate? Yes  Diet effective now              Code Status: Full code  Family Communication: Patient to update family-he is awake and alert.  Disposition Plan:  SNF when ready for discharge  Barriers to Discharge: Awaiting SNF bed-social worker following, remain strictly left foot nonweightbearing, his house is non livable secondary to his amputation, patient is not strong enough to discharged home by himself, continues to have physical therapy hopefully at one point he can be advanced to go home with home health .  Antimicrobial agents: Anti-infectives (From admission, onward)   Start     Dose/Rate Route Frequency Ordered Stop   01/30/20 0830  ceFAZolin (ANCEF) IVPB 2g/100 mL premix  Status:  Discontinued     2 g 200 mL/hr over 30 Minutes Intravenous To Short Stay 01/30/20 0824 01/30/20 1330   01/28/20 1800  cefTRIAXone (ROCEPHIN) 2 g in sodium chloride 0.9 % 100 mL IVPB     2 g 200 mL/hr over 30 Minutes Intravenous Every 24 hours 01/27/20 2211 01/30/20 1900   01/28/20 0600  vancomycin (VANCOCIN) IVPB 1000 mg/200 mL premix     1,000 mg 200 mL/hr over 60 Minutes Intravenous  Every 12 hours 01/27/20 1738 01/31/20 1006   01/27/20 1730  vancomycin (VANCOCIN) IVPB 1000 mg/200 mL premix  Status:  Discontinued     1,000 mg 200 mL/hr over 60 Minutes Intravenous  Once 01/27/20 1725  01/27/20 1726   01/27/20 1730  cefTRIAXone (ROCEPHIN) 2 g in sodium chloride 0.9 % 100 mL IVPB     2 g 200 mL/hr over 30 Minutes Intravenous  Once 01/27/20 1725 01/27/20 2010   01/27/20 1730  vancomycin (VANCOREADY) IVPB 2000 mg/400 mL     2,000 mg 200 mL/hr over 120 Minutes Intravenous  Once 01/27/20 1726 01/27/20 2219        MEDICATIONS: Scheduled Meds: . docusate sodium  100 mg Oral BID  . folic acid  1 mg Oral Daily  . loratadine  10 mg Oral Daily  . metoprolol tartrate  25 mg Oral BID  . multivitamin with minerals  1 tablet Oral Daily  . nicotine  14 mg Transdermal Daily  . thiamine  100 mg Oral Daily  . warfarin  7.5 mg Oral q1600  . Warfarin - Pharmacist Dosing Inpatient   Does not apply q1800   Continuous Infusions: . lactated ringers 10 mL/hr at 02/04/20 1304   PRN Meds:.acetaminophen **OR** acetaminophen, ALPRAZolam, metoprolol tartrate, morphine injection, ondansetron **OR** ondansetron (ZOFRAN) IV, oxyCODONE   PHYSICAL EXAM: Vital signs: Vitals:   02/14/20 0528 02/14/20 1237 02/14/20 2119 02/15/20 0437  BP:  (!) 115/92 (!) 122/98 (!) 123/96  Pulse:  65 79 (!) 56  Resp:  17 18 18   Temp:  98.5 F (36.9 C) 98.3 F (36.8 C) 97.8 F (36.6 C)  TempSrc:  Oral Oral Oral  SpO2:  100% 100% 100%  Weight: 91.2 kg   89 kg  Height:       Filed Weights   02/13/20 0300 02/14/20 0528 02/15/20 0437  Weight: 91.4 kg 91.2 kg 89 kg   Body mass index is 28.99 kg/m.   Awake Alert, Oriented X 3, No new F.N deficits, Normal affect Symmetrical Chest wall movement, Good air movement bilaterally, CTAB RRR,No Gallops,Rubs or new Murmurs, No Parasternal Heave +ve B.Sounds, Abd Soft, No tenderness, No rebound - guarding or rigidity No Cyanosis, Clubbing or edema, left foot bandaged  I have personally reviewed following labs and imaging studies  LABORATORY DATA: CBC: Recent Labs  Lab 02/10/20 0536 02/11/20 0401 02/12/20 0052 02/13/20 0231 02/14/20 0213  WBC 3.7* 3.3*  3.5* 2.7* 3.1*  HGB 9.3* 8.9* 9.3* 9.0* 9.0*  HCT 29.2* 27.4* 29.0* 29.2* 28.4*  MCV 83.7 83.3 84.5 84.9 83.8  PLT 227 249 241 247 [id number]    Basic Metabolic Panel: Recent Labs  Lab 02/09/20 0437 02/10/20 0536 02/11/20 0804 02/12/20 0052  NA 138 137  --   --   K 3.7 3.8  --   --   CL 102 102  --   --   CO2 26 26  --   --   GLUCOSE 113* 116*  --   --   BUN 6 7  --   --   CREATININE 0.75 0.80  --   --   CALCIUM 9.2 9.2  --   --   MG 1.5* 1.6* 1.7 1.9    GFR: Estimated Creatinine Clearance: 108.3 mL/min (by C-G formula based on SCr of 0.8 mg/dL).  Liver Function Tests: No results for input(s): AST, ALT, ALKPHOS, BILITOT, PROT, ALBUMIN in the last 168 hours. No results for input(s): LIPASE, AMYLASE in the last 168 hours.  No results for input(s): AMMONIA in the last 168 hours.  Coagulation Profile: Recent Labs  Lab 02/11/20 0401 02/12/20 0052 02/13/20 0231 02/14/20 0213 02/15/20 0416  INR 2.6* 2.7* 2.8* 2.6* 2.3*    Cardiac Enzymes: No results for input(s): CKTOTAL, CKMB, CKMBINDEX, TROPONINI in the last 168 hours.  BNP (last 3 results) No results for input(s): PROBNP in the last 8760 hours.  Lipid Profile: No results for input(s): CHOL, HDL, LDLCALC, TRIG, CHOLHDL, LDLDIRECT in the last 72 hours.  Thyroid Function Tests: No results for input(s): TSH, T4TOTAL, FREET4, T3FREE, THYROIDAB in the last 72 hours.  Anemia Panel: No results for input(s): VITAMINB12, FOLATE, FERRITIN, TIBC, IRON, RETICCTPCT in the last 72 hours.  Urine analysis:    Component Value Date/Time   COLORURINE YELLOW 01/27/2020 1841   APPEARANCEUR CLEAR 01/27/2020 1841   APPEARANCEUR Clear 08/03/2019 1143   LABSPEC 1.015 01/27/2020 1841   PHURINE 6.0 01/27/2020 1841   GLUCOSEU NEGATIVE 01/27/2020 1841   HGBUR SMALL (A) 01/27/2020 1841   BILIRUBINUR NEGATIVE 01/27/2020 1841   BILIRUBINUR small (A) 08/03/2019 1144   BILIRUBINUR Negative 08/03/2019 1143   KETONESUR 5 (A) 01/27/2020 1841    PROTEINUR 30 (A) 01/27/2020 1841   UROBILINOGEN 1.0 08/03/2019 1144   UROBILINOGEN 1.0 01/25/2013 0825   NITRITE NEGATIVE 01/27/2020 1841   LEUKOCYTESUR NEGATIVE 01/27/2020 1841    Sepsis Labs: Lactic Acid, Venous    Component Value Date/Time   LATICACIDVEN 1.2 01/28/2020 0240    MICROBIOLOGY: No results found for this or any previous visit (from the past 240 hour(s)).  RADIOLOGY STUDIES/RESULTS: No results found.   LOS: 31 days   Phillips Climes, MD  [facility]    To contact the attending provider between 7A-7P or the covering provider during after hours 7P-7A, please log into the web site [email or web address] and access using universal [place] password for that web site. If you do not have the password, please call the hospital operator.  02/15/2020, 11:47 AM

## 2020-02-16 LAB — PROTIME-INR
INR: 2.3 — ABNORMAL HIGH (ref 0.8–1.2)
Prothrombin Time: 24.9 s — ABNORMAL HIGH (ref 11.4–15.2)

## 2020-02-16 NOTE — Progress Notes (Signed)
PROGRESS NOTE        PATIENT DETAILS Name: Leonard Bentley Age: 59 y.o. Sex: male Date of Birth: 05-15-1961 Admit Date: 01/27/2020 Admitting Physician [name], MD [id number], Vernia Buff, NP  Brief Narrative: Patient is a 59 y.o. male with history of chronic diastolic heart failure, severe MR s/p mitral valve repair, recurrent PE on Coumadin, non-Hodgkin's lymphoma, alcohol abuse recent hospitalization from 3/5-3/12 osteomyelitis-requiring left first and second ray amputation who presented with wound dehiscence (noncompliant to weightbearing status), drainage and newly displaced fracture of the third metatarsal head with underlying osteomyelitis.  Patient was evaluated by orthopedics and underwent left third ray amputation.  Significant events: 3/5-3/12>> admission for left foot osteomyelitis-s/p left first and second ray amputation 3/21>> admit for left foot wound dehiscence-third metatarsal head displaced fracture with possible underlying osteomyelitis   Antimicrobial therapy: Ceftriaxone: 3/21>> 3/24 Vancomycin: 3/21>> 3/25  Microbiology data: 3/21>> blood culture: Negative  Procedures : 3/24>> left third ray amputation  Consults: Orthopedics  DVT Prophylaxis : Full dose anticoagulation with Heparin/Coumadin  Subjective: Denies any chest pain, denies any shortness of breath.  Patient has been working with staff to improve his physical activity.   Assessment/Plan:  Left foot wound dehiscence with left third metatarsal fracture with possible underlying osteomyelitis-s/p left third ray amputation:  - Orthopedics following-continue supportive care.  Per orthopedics-needs to be strict nonweightbearing on the left foot.  Hypomagnesemia:  - repleted.  Anemia of chronic disease . - no evidence of blood loss-I suspect this is secondary to inflammation related to chronic osteomyelitis/wound infection.  Follow for now.  Chronic diastolic heart  failure: Compensated-follow volume status closely  History of severe MR-s/p mitral valve repair with annular ring prosthesis 2017: Stable-outpatient follow-up with cardiology  History of recurrent pulmonary embolism: Was on  overlapping heparin/Coumadin-INR now therapeutic-only on Coumadin.  Pharmacy following.  History of SVT/PACs: Stable-on beta-blocker.  History of tobacco use: Continue transdermal nicotine  History of non-Hodgkin's lymphoma in remission: Follow-up with oncology in the outpatient setting  Diet: Diet Order            Diet Carb Modified Fluid consistency: Thin; Room service appropriate? Yes  Diet effective now              Code Status: Full code  Family Communication: Patient to update family-he is awake and alert.  Disposition Plan:  SNF when ready for discharge  Barriers to Discharge: Awaiting SNF bed-social worker following, remain strictly left foot nonweightbearing, his house is non livable secondary to his amputation, patient is not strong enough to discharged home by himself, continues to have physical therapy hopefully at one point he can be advanced to go home with home health .  Antimicrobial agents: Anti-infectives (From admission, onward)   Start     Dose/Rate Route Frequency Ordered Stop   01/30/20 0830  ceFAZolin (ANCEF) IVPB 2g/100 mL premix  Status:  Discontinued     2 g 200 mL/hr over 30 Minutes Intravenous To Short Stay 01/30/20 0824 01/30/20 1330   01/28/20 1800  cefTRIAXone (ROCEPHIN) 2 g in sodium chloride 0.9 % 100 mL IVPB     2 g 200 mL/hr over 30 Minutes Intravenous Every 24 hours 01/27/20 2211 01/30/20 1900   01/28/20 0600  vancomycin (VANCOCIN) IVPB 1000 mg/200 mL premix     1,000 mg 200 mL/hr over 60 Minutes Intravenous  Every 12 hours 01/27/20 1738 01/31/20 1006   01/27/20 1730  vancomycin (VANCOCIN) IVPB 1000 mg/200 mL premix  Status:  Discontinued     1,000 mg 200 mL/hr over 60 Minutes Intravenous  Once 01/27/20 1725  01/27/20 1726   01/27/20 1730  cefTRIAXone (ROCEPHIN) 2 g in sodium chloride 0.9 % 100 mL IVPB     2 g 200 mL/hr over 30 Minutes Intravenous  Once 01/27/20 1725 01/27/20 2010   01/27/20 1730  vancomycin (VANCOREADY) IVPB 2000 mg/400 mL     2,000 mg 200 mL/hr over 120 Minutes Intravenous  Once 01/27/20 1726 01/27/20 2219        MEDICATIONS: Scheduled Meds: . docusate sodium  100 mg Oral BID  . folic acid  1 mg Oral Daily  . loratadine  10 mg Oral Daily  . metoprolol tartrate  25 mg Oral BID  . multivitamin with minerals  1 tablet Oral Daily  . nicotine  14 mg Transdermal Daily  . thiamine  100 mg Oral Daily  . warfarin  7.5 mg Oral q1600  . Warfarin - Pharmacist Dosing Inpatient   Does not apply q1800   Continuous Infusions: . lactated ringers 10 mL/hr at 02/04/20 1304   PRN Meds:.acetaminophen **OR** acetaminophen, ALPRAZolam, metoprolol tartrate, morphine injection, ondansetron **OR** ondansetron (ZOFRAN) IV, oxyCODONE   PHYSICAL EXAM: Vital signs: Vitals:   02/15/20 1332 02/15/20 2107 02/16/20 0505 02/16/20 1324  BP: 124/88 (!) 124/95 (!) 129/99 (!) 124/96  Pulse: 91 (!) 103 85 87  Resp: 18 18 18 18   Temp: 98 F (36.7 C) 98.1 F (36.7 C) 98.7 F (37.1 C) 97.7 F (36.5 C)  TempSrc: Oral Oral Oral Oral  SpO2: 100% 100% 99% 100%  Weight:   89.1 kg   Height:       Filed Weights   02/14/20 0528 02/15/20 0437 02/16/20 0505  Weight: 91.2 kg 89 kg 89.1 kg   Body mass index is 29.02 kg/m.   Awake Alert, Oriented X 3,   Good air movement bilaterally, CTAB RRR Abd Soft No Cyanosis, Clubbing or edema, left foot bandaged  I have personally reviewed following labs and imaging studies  LABORATORY DATA: CBC: Recent Labs  Lab 02/10/20 0536 02/11/20 0401 02/12/20 0052 02/13/20 0231 02/14/20 0213  WBC 3.7* 3.3* 3.5* 2.7* 3.1*  HGB 9.3* 8.9* 9.3* 9.0* 9.0*  HCT 29.2* 27.4* 29.0* 29.2* 28.4*  MCV 83.7 83.3 84.5 84.9 83.8  PLT 227 249 241 247 244    Basic  Metabolic Panel: Recent Labs  Lab 02/10/20 0536 02/11/20 0804 02/12/20 0052  NA 137  --   --   K 3.8  --   --   CL 102  --   --   CO2 26  --   --   GLUCOSE 116*  --   --   BUN 7  --   --   CREATININE 0.80  --   --   CALCIUM 9.2  --   --   MG 1.6* 1.7 1.9    GFR: Estimated Creatinine Clearance: 108.5 mL/min (by C-G formula based on SCr of 0.8 mg/dL).  Liver Function Tests: No results for input(s): AST, ALT, ALKPHOS, BILITOT, PROT, ALBUMIN in the last 168 hours. No results for input(s): LIPASE, AMYLASE in the last 168 hours. No results for input(s): AMMONIA in the last 168 hours.  Coagulation Profile: Recent Labs  Lab 02/12/20 0052 02/13/20 0231 02/14/20 0213 02/15/20 0416 02/16/20 0426  INR 2.7* 2.8* 2.6* 2.3* 2.3*  Cardiac Enzymes: No results for input(s): CKTOTAL, CKMB, CKMBINDEX, TROPONINI in the last 168 hours.  BNP (last 3 results) No results for input(s): PROBNP in the last 8760 hours.  Lipid Profile: No results for input(s): CHOL, HDL, LDLCALC, TRIG, CHOLHDL, LDLDIRECT in the last 72 hours.  Thyroid Function Tests: No results for input(s): TSH, T4TOTAL, FREET4, T3FREE, THYROIDAB in the last 72 hours.  Anemia Panel: No results for input(s): VITAMINB12, FOLATE, FERRITIN, TIBC, IRON, RETICCTPCT in the last 72 hours.  Urine analysis:    Component Value Date/Time   COLORURINE YELLOW 01/27/2020 1841   APPEARANCEUR CLEAR 01/27/2020 1841   APPEARANCEUR Clear 08/03/2019 1143   LABSPEC 1.015 01/27/2020 1841   PHURINE 6.0 01/27/2020 1841   GLUCOSEU NEGATIVE 01/27/2020 1841   HGBUR SMALL (A) 01/27/2020 1841   BILIRUBINUR NEGATIVE 01/27/2020 1841   BILIRUBINUR small (A) 08/03/2019 1144   BILIRUBINUR Negative 08/03/2019 1143   KETONESUR 5 (A) 01/27/2020 1841   PROTEINUR 30 (A) 01/27/2020 1841   UROBILINOGEN 1.0 08/03/2019 1144   UROBILINOGEN 1.0 01/25/2013 0825   NITRITE NEGATIVE 01/27/2020 1841   LEUKOCYTESUR NEGATIVE 01/27/2020 1841    Sepsis  Labs: Lactic Acid, Venous    Component Value Date/Time   LATICACIDVEN 1.2 01/28/2020 0240    MICROBIOLOGY: No results found for this or any previous visit (from the past 240 hour(s)).  RADIOLOGY STUDIES/RESULTS: No results found.   LOS: 35 days   Phillips Climes, MD  [facility]    To contact the attending provider between 7A-7P or the covering provider during after hours 7P-7A, please log into the web site [email or web address] and access using universal [place] password for that web site. If you do not have the password, please call the hospital operator.  02/16/2020, 3:06 PM

## 2020-02-16 NOTE — Progress Notes (Signed)
ANTICOAGULATION CONSULT NOTE - Follow Up Consult  Pharmacy Consult for Warfarin Indication: hx recurrent PE  Allergies  Allergen Reactions  . Other Other (See Comments)    "Cactus" caused blisters on tongue (if prepared as food)    Patient Measurements: Height: 5\' 9"  (175.3 cm)(office visit 01/23/20) Weight: 89.1 kg (196 lb 8 oz) IBW/kg (Calculated) : 70.7 Heparin Dosing Weight: 89 kg  Vital Signs: Temp: 98.7 F (37.1 C) (04/10 0505) Temp Source: Oral (04/10 0505) BP: 129/99 (04/10 0505) Pulse Rate: 85 (04/10 0505)  Labs: Recent Labs    02/14/20 0213 02/15/20 0416 02/16/20 0426  HGB 9.0*  --   --   HCT 28.4*  --   --   PLT 244  --   --   LABPROT 28.1* 25.1* 24.9*  INR 2.6* 2.3* 2.3*    Estimated Creatinine Clearance: 108.5 mL/min (by C-G formula based on SCr of 0.8 mg/dL).  Assessment: 59 yr old male on Warfarin PTA for hx recurrent PE (04/2016).  INR supratherapeutic (3.8) on admit 3/21.  Warfarin held and Vitamin K 2.5 mg IV given on 3/22 for 3rd ray amputation on 3/24.  Warfarin resumed on 3/25 with Heparin bridge. Heparin held on 3/29 am after he bumped foot and had copious bleeding. Resumed that afternoon after bleeding has decreased. No further bleeding reported.  Heparin now stopped.  INR now therapeutic at 2.3,  We will change INR to MWF  Goal of Therapy:  INR 2 to 3 Monitor platelets by anticoagulation protocol: Yes   Plan:  Warfarin 7.5 mg po daily INR MWF - double check INR Monday  [name], PharmD  PGY1 Acute Care Pharmacy Resident 02/16/2020 11:09 AM

## 2020-02-17 NOTE — Progress Notes (Signed)
PROGRESS NOTE        PATIENT DETAILS Name: Leonard Bentley Age: 59 y.o. Sex: male Date of Birth: 04-20-1961 Admit Date: 01/27/2020 Admitting Physician [name], MD [id number], Vernia Buff, NP  Brief Narrative: Patient is a 59 y.o. male with history of chronic diastolic heart failure, severe MR s/p mitral valve repair, recurrent PE on Coumadin, non-Hodgkin's lymphoma, alcohol abuse recent hospitalization from 3/5-3/12 osteomyelitis-requiring left first and second ray amputation who presented with wound dehiscence (noncompliant to weightbearing status), drainage and newly displaced fracture of the third metatarsal head with underlying osteomyelitis.  Patient was evaluated by orthopedics and underwent left third ray amputation.  Significant events: 3/5-3/12>> admission for left foot osteomyelitis-s/p left first and second ray amputation 3/21>> admit for left foot wound dehiscence-third metatarsal head displaced fracture with possible underlying osteomyelitis   Antimicrobial therapy: Ceftriaxone: 3/21>> 3/24 Vancomycin: 3/21>> 3/25  Microbiology data: 3/21>> blood culture: Negative  Procedures : 3/24>> left third ray amputation  Consults: Orthopedics  DVT Prophylaxis : Full dose anticoagulation with Heparin/Coumadin  Subjective: Denies any chest pain, denies any shortness of breath.  Patient has been working with staff to improve his physical activity.   Assessment/Plan:  Left foot wound dehiscence with left third metatarsal fracture with possible underlying osteomyelitis-s/p left third ray amputation:  - Orthopedics following-continue supportive care.  Per orthopedics-needs to be strict nonweightbearing on the left foot.  Hypomagnesemia:  - repleted.  Anemia of chronic disease . - no evidence of blood loss-I suspect this is secondary to inflammation related to chronic osteomyelitis/wound infection.  Follow for now.  Chronic diastolic heart  failure: Compensated-follow volume status closely  History of severe MR-s/p mitral valve repair with annular ring prosthesis 2017: Stable-outpatient follow-up with cardiology  History of recurrent pulmonary embolism: Was on  overlapping heparin/Coumadin-INR now therapeutic-only on Coumadin.  Pharmacy following.  History of SVT/PACs: Stable-on beta-blocker.  History of tobacco use: Continue transdermal nicotine  History of non-Hodgkin's lymphoma in remission: Follow-up with oncology in the outpatient setting  Diet: Diet Order            Diet Carb Modified Fluid consistency: Thin; Room service appropriate? Yes  Diet effective now              Code Status: Full code  Family Communication: Patient to update family-he is awake and alert.  Disposition Plan:  SNF when ready for discharge  Barriers to Discharge: Awaiting SNF bed-social worker following, remain strictly left foot nonweightbearing, his house is non livable secondary to his amputation, patient is not strong enough to discharged home by himself, continues to have physical therapy hopefully at one point he can be advanced to go home with home health .  Antimicrobial agents: Anti-infectives (From admission, onward)   Start     Dose/Rate Route Frequency Ordered Stop   01/30/20 0830  ceFAZolin (ANCEF) IVPB 2g/100 mL premix  Status:  Discontinued     2 g 200 mL/hr over 30 Minutes Intravenous To Short Stay 01/30/20 0824 01/30/20 1330   01/28/20 1800  cefTRIAXone (ROCEPHIN) 2 g in sodium chloride 0.9 % 100 mL IVPB     2 g 200 mL/hr over 30 Minutes Intravenous Every 24 hours 01/27/20 2211 01/30/20 1900   01/28/20 0600  vancomycin (VANCOCIN) IVPB 1000 mg/200 mL premix     1,000 mg 200 mL/hr over 60 Minutes Intravenous  Every 12 hours 01/27/20 1738 01/31/20 1006   01/27/20 1730  vancomycin (VANCOCIN) IVPB 1000 mg/200 mL premix  Status:  Discontinued     1,000 mg 200 mL/hr over 60 Minutes Intravenous  Once 01/27/20 1725  01/27/20 1726   01/27/20 1730  cefTRIAXone (ROCEPHIN) 2 g in sodium chloride 0.9 % 100 mL IVPB     2 g 200 mL/hr over 30 Minutes Intravenous  Once 01/27/20 1725 01/27/20 2010   01/27/20 1730  vancomycin (VANCOREADY) IVPB 2000 mg/400 mL     2,000 mg 200 mL/hr over 120 Minutes Intravenous  Once 01/27/20 1726 01/27/20 2219        MEDICATIONS: Scheduled Meds: . docusate sodium  100 mg Oral BID  . folic acid  1 mg Oral Daily  . loratadine  10 mg Oral Daily  . metoprolol tartrate  25 mg Oral BID  . multivitamin with minerals  1 tablet Oral Daily  . nicotine  14 mg Transdermal Daily  . thiamine  100 mg Oral Daily  . warfarin  7.5 mg Oral q1600  . Warfarin - Pharmacist Dosing Inpatient   Does not apply q1800   Continuous Infusions: . lactated ringers 10 mL/hr at 02/04/20 1304   PRN Meds:.acetaminophen **OR** acetaminophen, ALPRAZolam, metoprolol tartrate, morphine injection, ondansetron **OR** ondansetron (ZOFRAN) IV, oxyCODONE   PHYSICAL EXAM: Vital signs: Vitals:   02/16/20 1324 02/16/20 2123 02/17/20 0514 02/17/20 1202  BP: (!) 124/96 115/90 116/83 (!) 123/96  Pulse: 87 (!) 104 85 67  Resp: 18 18 18 18   Temp: 97.7 F (36.5 C) 98.1 F (36.7 C) 98.4 F (36.9 C) 98.2 F (36.8 C)  TempSrc: Oral Oral Oral Oral  SpO2: 100% 98% 100% 100%  Weight:      Height:       Filed Weights   02/14/20 0528 02/15/20 0437 02/16/20 0505  Weight: 91.2 kg 89 kg 89.1 kg   Body mass index is 29.02 kg/m.   Awake alert oriented  Good air entry, clear to auscultation  Regular rate and rhythm,  abdomen soft  Lower extremities no edema left foot wounds appears to be healing, no discharge or foul smelling odor, no dehiscence.        I have personally reviewed following labs and imaging studies  LABORATORY DATA: CBC: Recent Labs  Lab 02/11/20 0401 02/12/20 0052 02/13/20 0231 02/14/20 0213  WBC 3.3* 3.5* 2.7* 3.1*  HGB 8.9* 9.3* 9.0* 9.0*  HCT 27.4* 29.0* 29.2* 28.4*  MCV 83.3  84.5 84.9 83.8  PLT 249 241 247 [id number]    Basic Metabolic Panel: Recent Labs  Lab 02/11/20 0804 02/12/20 0052  MG 1.7 1.9    GFR: Estimated Creatinine Clearance: 108.5 mL/min (by C-G formula based on SCr of 0.8 mg/dL).  Liver Function Tests: No results for input(s): AST, ALT, ALKPHOS, BILITOT, PROT, ALBUMIN in the last 168 hours. No results for input(s): LIPASE, AMYLASE in the last 168 hours. No results for input(s): AMMONIA in the last 168 hours.  Coagulation Profile: Recent Labs  Lab 02/12/20 0052 02/13/20 0231 02/14/20 0213 02/15/20 0416 02/16/20 0426  INR 2.7* 2.8* 2.6* 2.3* 2.3*    Cardiac Enzymes: No results for input(s): CKTOTAL, CKMB, CKMBINDEX, TROPONINI in the last 168 hours.  BNP (last 3 results) No results for input(s): PROBNP in the last 8760 hours.  Lipid Profile: No results for input(s): CHOL, HDL, LDLCALC, TRIG, CHOLHDL, LDLDIRECT in the last 72 hours.  Thyroid Function Tests: No results for input(s): TSH, T4TOTAL, FREET4, T3FREE,  THYROIDAB in the last 72 hours.  Anemia Panel: No results for input(s): VITAMINB12, FOLATE, FERRITIN, TIBC, IRON, RETICCTPCT in the last 72 hours.  Urine analysis:    Component Value Date/Time   COLORURINE YELLOW 01/27/2020 1841   APPEARANCEUR CLEAR 01/27/2020 1841   APPEARANCEUR Clear 08/03/2019 1143   LABSPEC 1.015 01/27/2020 1841   PHURINE 6.0 01/27/2020 1841   GLUCOSEU NEGATIVE 01/27/2020 1841   HGBUR SMALL (A) 01/27/2020 1841   BILIRUBINUR NEGATIVE 01/27/2020 1841   BILIRUBINUR small (A) 08/03/2019 1144   BILIRUBINUR Negative 08/03/2019 1143   KETONESUR 5 (A) 01/27/2020 1841   PROTEINUR 30 (A) 01/27/2020 1841   UROBILINOGEN 1.0 08/03/2019 1144   UROBILINOGEN 1.0 01/25/2013 0825   NITRITE NEGATIVE 01/27/2020 1841   LEUKOCYTESUR NEGATIVE 01/27/2020 1841    Sepsis Labs: Lactic Acid, Venous    Component Value Date/Time   LATICACIDVEN 1.2 01/28/2020 0240    MICROBIOLOGY: No results found for this or any  previous visit (from the past 240 hour(s)).  RADIOLOGY STUDIES/RESULTS: No results found.   LOS: 21 days   Phillips Climes, MD  [facility]    To contact the attending provider between 7A-7P or the covering provider during after hours 7P-7A, please log into the web site [email or web address] and access using universal [place] password for that web site. If you do not have the password, please call the hospital operator.  02/17/2020, 4:32 PM

## 2020-02-18 LAB — PROTIME-INR
INR: 2.3 — ABNORMAL HIGH (ref 0.8–1.2)
Prothrombin Time: 25.2 seconds — ABNORMAL HIGH (ref 11.4–15.2)

## 2020-02-18 NOTE — TOC Progression Note (Signed)
Transition of Care [facility]) - Progression Note    Patient Details  Name: Leonard Bentley MRN: [id number] Date of Birth: Aug 12, 1961  Transition of Care [facility]) CM/SW Frederic, LCSW Phone Number: 02/18/2020, 4:54 PM  Clinical Narrative:    CSW received request regarding possibility of getting patient a ramp at home. RNCM to investigate if charity assistance is available for that service.      Barriers to Discharge: Continued Medical Work up, SNF Pending bed offer, SNF Pending payor source - LOG  Expected Discharge Plan and Services   In-house Referral: Clinical Social Work, Risk manager Acute Care Choice: [place] Living arrangements for the past 2 months: [place] Determinants of Health (SDOH) Interventions    Readmission Risk Interventions Readmission Risk Prevention Plan 01/31/2020 01/16/2020 01/18/2019  Transportation Screening Complete Complete Complete  PCP or Specialist Appt within 5-7 Days Complete Complete -  Home Care Screening Complete Complete -  Medication Review (RN CM) Complete Complete -  Medication Review ([place]) - - Complete  HRI or [place] - - Complete  [place] - - Not Applicable  Some recent data might be hidden

## 2020-02-18 NOTE — Progress Notes (Signed)
PROGRESS NOTE        PATIENT DETAILS Name: Leonard Bentley Age: 59 y.o. Sex: male Date of Birth: [birth year] Admit Date: 01/27/2020 Admitting Physician [name], MD [id number], Vernia Buff, NP  Brief Narrative: Patient is a 59 y.o. male with history of chronic diastolic heart failure, severe MR s/p mitral valve repair, recurrent PE on Coumadin, non-Hodgkin's lymphoma, alcohol abuse recent hospitalization from 3/5-3/12 osteomyelitis-requiring left first and second ray amputation who presented with wound dehiscence (noncompliant to weightbearing status), drainage and newly displaced fracture of the third metatarsal head with underlying osteomyelitis.  Patient was evaluated by orthopedics and underwent left third ray amputation.  Significant events: 3/5-3/12>> admission for left foot osteomyelitis-s/p left first and second ray amputation 3/21>> admit for left foot wound dehiscence-third metatarsal head displaced fracture with possible underlying osteomyelitis   Antimicrobial therapy: Ceftriaxone: 3/21>> 3/24 Vancomycin: 3/21>> 3/25  Microbiology data: 3/21>> blood culture: Negative  Procedures : 3/24>> left third ray amputation  Consults: Orthopedics  DVT Prophylaxis : Full dose anticoagulation with Heparin/Coumadin  Subjective: Denies any chest pain, denies any shortness of breath.  Patient has been working with staff to improve his physical activity.   Assessment/Plan:  Left foot wound dehiscence with left third metatarsal fracture with possible underlying osteomyelitis-s/p left third ray amputation:  - Orthopedics following-continue supportive care.  Per orthopedics-needs to be strict nonweightbearing on the left foot. -Discussed with Dr. [name] who reviewed imaging, reports sutures need to stay for another week, and patient may start to ambulate with weightbearing as tolerated only to the left heel.  Hypomagnesemia:  - repleted.  Anemia of  chronic disease . - no evidence of blood loss-I suspect this is secondary to inflammation related to chronic osteomyelitis/wound infection.  Follow for now.  Chronic diastolic heart failure: Compensated-follow volume status closely  History of severe MR-s/p mitral valve repair with annular ring prosthesis 2017: Stable-outpatient follow-up with cardiology  History of recurrent pulmonary embolism: Was on  overlapping heparin/Coumadin-INR now therapeutic-only on Coumadin.  Pharmacy following.  History of SVT/PACs: Stable-on beta-blocker.  History of tobacco use: Continue transdermal nicotine  History of non-Hodgkin's lymphoma in remission: Follow-up with oncology in the outpatient setting  Diet: Diet Order            Diet Carb Modified Fluid consistency: Thin; Room service appropriate? Yes  Diet effective now              Code Status: Full code  Family Communication: Patient to update family-he is awake and alert.  Disposition Plan:  SNF when ready for discharge  Barriers to Discharge: Awaiting SNF bed-social worker following, remain  left foot normal weightbearing to left heel, his house is non livable secondary to his amputation, patient is not strong enough to discharged home by himself, continues to have physical therapy hopefully at one point he can be advanced to go home with home health .  Antimicrobial agents: Anti-infectives (From admission, onward)   Start     Dose/Rate Route Frequency Ordered Stop   01/30/20 0830  ceFAZolin (ANCEF) IVPB 2g/100 mL premix  Status:  Discontinued     2 g 200 mL/hr over 30 Minutes Intravenous To Short Stay 01/30/20 0824 01/30/20 1330   01/28/20 1800  cefTRIAXone (ROCEPHIN) 2 g in sodium chloride 0.9 % 100 mL IVPB     2 g 200 mL/hr over  30 Minutes Intravenous Every 24 hours 01/27/20 2211 01/30/20 1900   01/28/20 0600  vancomycin (VANCOCIN) IVPB 1000 mg/200 mL premix     1,000 mg 200 mL/hr over 60 Minutes Intravenous Every 12 hours  01/27/20 1738 01/31/20 1006   01/27/20 1730  vancomycin (VANCOCIN) IVPB 1000 mg/200 mL premix  Status:  Discontinued     1,000 mg 200 mL/hr over 60 Minutes Intravenous  Once 01/27/20 1725 01/27/20 1726   01/27/20 1730  cefTRIAXone (ROCEPHIN) 2 g in sodium chloride 0.9 % 100 mL IVPB     2 g 200 mL/hr over 30 Minutes Intravenous  Once 01/27/20 1725 01/27/20 2010   01/27/20 1730  vancomycin (VANCOREADY) IVPB 2000 mg/400 mL     2,000 mg 200 mL/hr over 120 Minutes Intravenous  Once 01/27/20 1726 01/27/20 2219        MEDICATIONS: Scheduled Meds: . docusate sodium  100 mg Oral BID  . folic acid  1 mg Oral Daily  . loratadine  10 mg Oral Daily  . metoprolol tartrate  25 mg Oral BID  . multivitamin with minerals  1 tablet Oral Daily  . nicotine  14 mg Transdermal Daily  . thiamine  100 mg Oral Daily  . warfarin  7.5 mg Oral q1600  . Warfarin - Pharmacist Dosing Inpatient   Does not apply q1800   Continuous Infusions: . lactated ringers 10 mL/hr at 02/04/20 1304   PRN Meds:.acetaminophen **OR** acetaminophen, ALPRAZolam, metoprolol tartrate, morphine injection, ondansetron **OR** ondansetron (ZOFRAN) IV, oxyCODONE   PHYSICAL EXAM: Vital signs: Vitals:   02/17/20 2130 02/18/20 0319 02/18/20 0645 02/18/20 1542  BP: (!) 129/98  (!) 115/94 (!) 89/62  Pulse: 82  95   Resp:   18   Temp:   97.7 F (36.5 C) 98.3 F (36.8 C)  TempSrc:   Oral Axillary  SpO2:   100%   Weight:  89.6 kg    Height:       Filed Weights   02/15/20 0437 02/16/20 0505 02/18/20 0319  Weight: 89 kg 89.1 kg 89.6 kg   Body mass index is 29.17 kg/m.   AWake alert oriented Good Air entry, clear to auscultation Good rate and rhythm abdomen soft  Left foot bandaged, please see pictures below obtained on 4/11.        I have personally reviewed following labs and imaging studies  LABORATORY DATA: CBC: Recent Labs  Lab 02/12/20 0052 02/13/20 0231 02/14/20 0213  WBC 3.5* 2.7* 3.1*  HGB 9.3* 9.0*  9.0*  HCT 29.0* 29.2* 28.4*  MCV 84.5 84.9 83.8  PLT 241 247 [id number]    Basic Metabolic Panel: Recent Labs  Lab 02/12/20 0052  MG 1.9    GFR: Estimated Creatinine Clearance: 108.8 mL/min (by C-G formula based on SCr of 0.8 mg/dL).  Liver Function Tests: No results for input(s): AST, ALT, ALKPHOS, BILITOT, PROT, ALBUMIN in the last 168 hours. No results for input(s): LIPASE, AMYLASE in the last 168 hours. No results for input(s): AMMONIA in the last 168 hours.  Coagulation Profile: Recent Labs  Lab 02/13/20 0231 02/14/20 0213 02/15/20 0416 02/16/20 0426 02/18/20 0449  INR 2.8* 2.6* 2.3* 2.3* 2.3*    Cardiac Enzymes: No results for input(s): CKTOTAL, CKMB, CKMBINDEX, TROPONINI in the last 168 hours.  BNP (last 3 results) No results for input(s): PROBNP in the last 8760 hours.  Lipid Profile: No results for input(s): CHOL, HDL, LDLCALC, TRIG, CHOLHDL, LDLDIRECT in the last 72 hours.  Thyroid Function Tests: No  results for input(s): TSH, T4TOTAL, FREET4, T3FREE, THYROIDAB in the last 72 hours.  Anemia Panel: No results for input(s): VITAMINB12, FOLATE, FERRITIN, TIBC, IRON, RETICCTPCT in the last 72 hours.  Urine analysis:    Component Value Date/Time   COLORURINE YELLOW 01/27/2020 1841   APPEARANCEUR CLEAR 01/27/2020 1841   APPEARANCEUR Clear 08/03/2019 1143   LABSPEC 1.015 01/27/2020 1841   PHURINE 6.0 01/27/2020 1841   GLUCOSEU NEGATIVE 01/27/2020 1841   HGBUR SMALL (A) 01/27/2020 1841   BILIRUBINUR NEGATIVE 01/27/2020 1841   BILIRUBINUR small (A) 08/03/2019 1144   BILIRUBINUR Negative 08/03/2019 1143   KETONESUR 5 (A) 01/27/2020 1841   PROTEINUR 30 (A) 01/27/2020 1841   UROBILINOGEN 1.0 08/03/2019 1144   UROBILINOGEN 1.0 01/25/2013 0825   NITRITE NEGATIVE 01/27/2020 1841   LEUKOCYTESUR NEGATIVE 01/27/2020 1841    Sepsis Labs: Lactic Acid, Venous    Component Value Date/Time   LATICACIDVEN 1.2 01/28/2020 0240    MICROBIOLOGY: No results found for  this or any previous visit (from the past 240 hour(s)).  RADIOLOGY STUDIES/RESULTS: No results found.   LOS: 58 days   Phillips Climes, MD  [facility]    To contact the attending provider between 7A-7P or the covering provider during after hours 7P-7A, please log into the web site [email or web address] and access using universal [place] password for that web site. If you do not have the password, please call the hospital operator.  02/18/2020, 4:10 PM

## 2020-02-18 NOTE — Progress Notes (Signed)
Occupational Therapy Treatment Patient Details Name: Leonard Bentley MRN: [id number] DOB: [birth year] Today's Date: 02/18/2020    History of present illness 59 y.o. male with what a recent left foot first and second ray amputation for osteomyelitis and discharged on March 10/27/2020 with history of minimally invasive mitral valve repair, chronic combined systolic and diastolic CHF last EF is improved to 55 to 60%, alcohol abuse, non-Hodgkin's lymphoma stage II in remission presents to the ER with complaint of increasing pain and discharge from the left foot. following L foot first and 2nd ray amputation.   X-rays show fracture involving the left third metatarsal concerning for osteomyelitis. Admitted 01/27/20 for amputation revision sx and s/p 3rd ray amputation on 01/30/20.   OT comments  Pt now able to bear weight through L heel in post op shoe per MD orders. Pt demonstrated ability to don shoe and ambulate in room with RW adhering to weight bearing status. Pt stood completed grooming at sink and toileted with supervision. He has been routinely sitting to bathe and dress at sink with set up.    Follow Up Recommendations  SNF    Equipment Recommendations  3 in 1 bedside commode(RW)    Recommendations for Other Services      Precautions / Restrictions Precautions Precautions: Fall Required Braces or Orthoses: Other Brace Other Brace: post-op shoe Restrictions Weight Bearing Restrictions: Yes LLE Weight Bearing: Partial weight bearing LLE Partial Weight Bearing Percentage or Pounds: weight bearing through heel in post op shoe       Mobility Bed Mobility Overal bed mobility: Independent                Transfers Overall transfer level: Modified independent Equipment used: Rolling walker (2 wheeled) Transfers: Sit to/from Stand           General transfer comment: good technique while keeping weight through heel    Balance Overall balance assessment: Needs assistance    Sitting balance-Leahy Scale: Normal       Standing balance-Leahy Scale: Fair Standing balance comment: leans on sink in static standing                           ADL either performed or assessed with clinical judgement   ADL Overall ADL's : Needs assistance/impaired     Grooming: Wash/dry hands;Wash/dry face;Oral care;Standing;Supervision/safety   Upper Body Bathing: Set up;Sitting Upper Body Bathing Details (indicate cue type and reason): asks for help with his back Lower Body Bathing: Set up;Sitting/lateral leans   Upper Body Dressing : Set up;Sitting   Lower Body Dressing: Set up;Sitting/lateral leans Lower Body Dressing Details (indicate cue type and reason): donned post op shoe Toilet Transfer: Ambulation;RW;Supervision/safety           Functional mobility during ADLs: Rolling walker;Supervision/safety General ADL Comments: pt able to maintain weight through heel only in post op shoe with ambulation     Vision       Perception     Praxis      Cognition Arousal/Alertness: Awake/alert Behavior During Therapy: WFL for tasks assessed/performed Overall Cognitive Status: Impaired/Different from baseline Area of Impairment: Safety/judgement                               General Comments: pt continues to demonstrate decreased awareness of safety and monitoring of fatigue        Exercises     Shoulder  Instructions       General Comments      Pertinent Vitals/ Pain       Pain Assessment: No/denies pain  Home Living                                          Prior Functioning/Environment              Frequency  Min 2X/week        Progress Toward Goals  OT Goals(current goals can now be found in the care plan section)  Progress towards OT goals: Progressing toward goals  Acute Rehab OT Goals Patient Stated Goal: to be able to walker further, to improve OT Goal Formulation: With patient Time For  Goal Achievement: 02/21/20 Potential to Achieve Goals: Good  Plan Discharge plan remains appropriate    Co-evaluation                 AM-PAC OT "6 Clicks" Daily Activity     Outcome Measure   Help from another person eating meals?: None Help from another person taking care of personal grooming?: A Little Help from another person toileting, which includes using toliet, bedpan, or urinal?: A Little Help from another person bathing (including washing, rinsing, drying)?: None Help from another person to put on and taking off regular upper body clothing?: None Help from another person to put on and taking off regular lower body clothing?: None 6 Click Score: 22    End of Session Equipment Utilized During Treatment: Rolling walker;Other (comment)(post op shoe)  OT Visit Diagnosis: Unsteadiness on feet (R26.81);Other abnormalities of gait and mobility (R26.89)   Activity Tolerance Patient tolerated treatment well   Patient Left in bed;with call bell/phone within reach   Nurse Communication          Time: [id number] OT Time Calculation (min): 18 min  Charges: OT General Charges $OT Visit: 1 Visit OT Treatments $Self Care/Home Management : 8-22 mins  Nestor Lewandowsky, OTR/L Acute Rehabilitation Services Pager: [phone] Office: [phone]   Malka So 02/18/2020, 11:49 AM

## 2020-02-18 NOTE — Plan of Care (Signed)
  Problem: Health Behavior/Discharge Planning: Goal: Ability to manage health-related needs will improve Outcome: Progressing   Problem: Activity: Goal: Risk for activity intolerance will decrease Outcome: Progressing   Problem: Elimination: Goal: Will not experience complications related to bowel motility Outcome: Progressing   Problem: Pain Managment: Goal: General experience of comfort will improve Outcome: Progressing   

## 2020-02-18 NOTE — Progress Notes (Signed)
ANTICOAGULATION CONSULT NOTE - Follow Up Consult  Pharmacy Consult for Warfarin Indication: hx recurrent PE  Allergies  Allergen Reactions  . Other Other (See Comments)    "Cactus" caused blisters on tongue (if prepared as food)    Patient Measurements: Height: 5\' 9"  (175.3 cm)(office visit 01/23/20) Weight: 89.6 kg (197 lb 8.5 oz) IBW/kg (Calculated) : 70.7 Heparin Dosing Weight: 89 kg  Vital Signs: Temp: 97.7 F (36.5 C) (04/12 0645) Temp Source: Oral (04/12 0645) BP: 115/94 (04/12 0645) Pulse Rate: 95 (04/12 0645)  Labs: Recent Labs    02/16/20 0426 02/18/20 0449  LABPROT 24.9* 25.2*  INR 2.3* 2.3*    Estimated Creatinine Clearance: 108.8 mL/min (by C-G formula based on SCr of 0.8 mg/dL).  Assessment: 59 yr old male on Warfarin PTA for hx recurrent PE (04/2016).  INR supratherapeutic (3.8) on admit 3/21.  Warfarin held and Vitamin K 2.5 mg IV given on 3/22 for 3rd ray amputation on 3/24.  Warfarin resumed on 3/25 with Heparin bridge. Heparin held on 3/29 am after he bumped foot and had copious bleeding. Resumed that afternoon after bleeding has decreased. No further bleeding reported.  Heparin now stopped.  02/18/20 INR therapeutic at 2.3  Goal of Therapy:  INR 2 to 3 Monitor platelets by anticoagulation protocol: Yes   Plan:  Continue Warfarin 7.5 mg po daily INR MWF Monitor CBC, s/s bleeding   Shela Commons, PharmD, BCPS 02/18/20 9:49 AM

## 2020-02-18 NOTE — Progress Notes (Signed)
Orthopedic Tech Progress Note Patient Details:  Hiren Vitucci Markarian 01-03-1961 [id number]  Ortho Devices Type of Ortho Device: Postop shoe/boot Ortho Device/Splint Location: LLE Ortho Device/Splint Interventions: Application, Ordered   Post Interventions Patient Tolerated: Well Instructions Provided: Care of device   Janit Pagan 02/18/2020, 8:33 AM

## 2020-02-19 LAB — CBC
HCT: 31.1 % — ABNORMAL LOW (ref 39.0–52.0)
Hemoglobin: 9.6 g/dL — ABNORMAL LOW (ref 13.0–17.0)
MCH: 25.7 pg — ABNORMAL LOW (ref 26.0–34.0)
MCHC: 30.9 g/dL (ref 30.0–36.0)
MCV: 83.4 fL (ref 80.0–100.0)
Platelets: 264 10*3/uL (ref 150–400)
RBC: 3.73 MIL/uL — ABNORMAL LOW (ref 4.22–5.81)
RDW: 14.6 % (ref 11.5–15.5)
WBC: 3.4 10*3/uL — ABNORMAL LOW (ref 4.0–10.5)
nRBC: 0 % (ref 0.0–0.2)

## 2020-02-19 LAB — BASIC METABOLIC PANEL
Anion gap: 9 (ref 5–15)
BUN: 13 mg/dL (ref 6–20)
CO2: 25 mmol/L (ref 22–32)
Calcium: 9.2 mg/dL (ref 8.9–10.3)
Chloride: 102 mmol/L (ref 98–111)
Creatinine, Ser: 0.92 mg/dL (ref 0.61–1.24)
GFR calc Af Amer: >60 mL/min (ref >60)
GFR calc non Af Amer: >60 mL/min (ref >60)
Glucose, Bld: 112 mg/dL — ABNORMAL HIGH (ref 70–99)
Potassium: 4.2 mmol/L (ref 3.5–5.1)
Sodium: 136 mmol/L (ref 135–145)

## 2020-02-19 MED ORDER — OXYCODONE HCL 5 MG PO TABS
5.0000 mg | ORAL_TABLET | ORAL | Status: DC | PRN
  Administered 2020-02-19 – 2020-02-25 (×23): 5 mg via ORAL
  Filled 2020-02-19 (×23): qty 1

## 2020-02-19 NOTE — Progress Notes (Signed)
   02/18/20 2041  Assess: MEWS Score  Temp 98.7 F (37.1 C)  BP 119/88  Pulse Rate (!) 108  Resp 18  Level of Consciousness Alert  SpO2 99 %  O2 Device Room Air  Patient Activity (if Appropriate) In bed  Assess: MEWS Score  MEWS Temp 0  MEWS Systolic 0  MEWS Pulse 1  MEWS RR 0  MEWS LOC 0  MEWS Score 1  MEWS Score Color Green  Assess: if the MEWS score is Yellow or Red  Were vital signs taken at a resting state? No  Focused Assessment Documented focused assessment  Early Detection of Sepsis Score *See Row Information* Low  MEWS guidelines implemented *See Row Information* No, other (Comment) (no acute changes)

## 2020-02-19 NOTE — Progress Notes (Signed)
PROGRESS NOTE        PATIENT DETAILS Name: Leonard Bentley Age: 59 y.o. Sex: male Date of Birth: [birth year] Admit Date: 01/27/2020 Admitting Physician [name], MD [id number], Vernia Buff, NP  Brief Narrative: Patient is a 59 y.o. male with history of chronic diastolic heart failure, severe MR s/p mitral valve repair, recurrent PE on Coumadin, non-Hodgkin's lymphoma, alcohol abuse recent hospitalization from 3/5-3/12 osteomyelitis-requiring left first and second ray amputation who presented with wound dehiscence (noncompliant to weightbearing status), drainage and newly displaced fracture of the third metatarsal head with underlying osteomyelitis.  Patient was evaluated by orthopedics and underwent left third ray amputation.  Significant events: 3/5-3/12>> admission for left foot osteomyelitis-s/p left first and second ray amputation 3/21>> admit for left foot wound dehiscence-third metatarsal head displaced fracture with possible underlying osteomyelitis   Antimicrobial therapy: Ceftriaxone: 3/21>> 3/24 Vancomycin: 3/21>> 3/25  Microbiology data: 3/21>> blood culture: Negative  Procedures : 3/24>> left third ray amputation  Consults: Orthopedics  DVT Prophylaxis : Full dose anticoagulation with Heparin/Coumadin  Subjective: Denies any chest pain, denies any shortness of breath.  Patient has been working very well with staff and PT to improve his physical activity .  Assessment/Plan:  Left foot wound dehiscence with left third metatarsal fracture with possible underlying osteomyelitis-s/p left third ray amputation:  - Orthopedics following-continue supportive care.  Per orthopedics-needs to be strict nonweightbearing on the left foot. -Discussed with Dr. [name] who reviewed imaging, reports sutures need to stay for another week, and patient may start to ambulate with weightbearing as tolerated only to the left heel.  Hypomagnesemia:  -  repleted.  Anemia of chronic disease . - no evidence of blood loss-I suspect this is secondary to inflammation related to chronic osteomyelitis/wound infection.  Follow for now.  Chronic diastolic heart failure: Compensated-follow volume status closely  History of severe MR-s/p mitral valve repair with annular ring prosthesis 2017: Stable-outpatient follow-up with cardiology  History of recurrent pulmonary embolism: Was on  overlapping heparin/Coumadin-INR now therapeutic-only on Coumadin.  Pharmacy following.  History of SVT/PACs: Stable-on beta-blocker.  History of tobacco use: Continue transdermal nicotine  History of non-Hodgkin's lymphoma in remission: Follow-up with oncology in the outpatient setting  Diet: Diet Order            Diet Carb Modified Fluid consistency: Thin; Room service appropriate? Yes  Diet effective now              Code Status: Full code  Family Communication: Patient to update family-he is awake and alert.  Disposition Plan:  SNF when ready for discharge,  Barriers to Discharge: Awaiting SNF bed, patient need SNF level per PT recommendation, unsafe for discharge home yet, appears to be improving, is currently able to tolerate weight in the heel, will need to ramp as well for his house, social worker involved to see if these issues can be resolved.  Antimicrobial agents: Anti-infectives (From admission, onward)   Start     Dose/Rate Route Frequency Ordered Stop   01/30/20 0830  ceFAZolin (ANCEF) IVPB 2g/100 mL premix  Status:  Discontinued     2 g 200 mL/hr over 30 Minutes Intravenous To Short Stay 01/30/20 0824 01/30/20 1330   01/28/20 1800  cefTRIAXone (ROCEPHIN) 2 g in sodium chloride 0.9 % 100 mL IVPB     2 g 200 mL/hr over 30  Minutes Intravenous Every 24 hours 01/27/20 2211 01/30/20 1900   01/28/20 0600  vancomycin (VANCOCIN) IVPB 1000 mg/200 mL premix     1,000 mg 200 mL/hr over 60 Minutes Intravenous Every 12 hours 01/27/20 1738  01/31/20 1006   01/27/20 1730  vancomycin (VANCOCIN) IVPB 1000 mg/200 mL premix  Status:  Discontinued     1,000 mg 200 mL/hr over 60 Minutes Intravenous  Once 01/27/20 1725 01/27/20 1726   01/27/20 1730  cefTRIAXone (ROCEPHIN) 2 g in sodium chloride 0.9 % 100 mL IVPB     2 g 200 mL/hr over 30 Minutes Intravenous  Once 01/27/20 1725 01/27/20 2010   01/27/20 1730  vancomycin (VANCOREADY) IVPB 2000 mg/400 mL     2,000 mg 200 mL/hr over 120 Minutes Intravenous  Once 01/27/20 1726 01/27/20 2219        MEDICATIONS: Scheduled Meds: . docusate sodium  100 mg Oral BID  . folic acid  1 mg Oral Daily  . loratadine  10 mg Oral Daily  . metoprolol tartrate  25 mg Oral BID  . multivitamin with minerals  1 tablet Oral Daily  . nicotine  14 mg Transdermal Daily  . thiamine  100 mg Oral Daily  . warfarin  7.5 mg Oral q1600  . Warfarin - Pharmacist Dosing Inpatient   Does not apply q1800   Continuous Infusions: . lactated ringers 10 mL/hr at 02/04/20 1304   PRN Meds:.acetaminophen **OR** acetaminophen, ALPRAZolam, metoprolol tartrate, ondansetron **OR** ondansetron (ZOFRAN) IV, oxyCODONE   PHYSICAL EXAM: Vital signs: Vitals:   02/18/20 1542 02/18/20 2041 02/19/20 0425 02/19/20 0900  BP: (!) 89/62 119/88 107/75 130/86  Pulse:  (!) 108 77   Resp:  18 18   Temp: 98.3 F (36.8 C) 98.7 F (37.1 C) 98.3 F (36.8 C)   TempSrc: Axillary Oral Oral   SpO2:  99% (!) 88%   Weight:      Height:       Filed Weights   02/15/20 0437 02/16/20 0505 02/18/20 0319  Weight: 89 kg 89.1 kg 89.6 kg   Body mass index is 29.17 kg/m.   Awake, alert and oriented good air entry, clear to auscultation Heart regular rate and rhythm Soft abdomen Left foot bandaged, please see pictures below obtained on 4/11.        I have personally reviewed following labs and imaging studies  LABORATORY DATA: CBC: Recent Labs  Lab 02/13/20 0231 02/14/20 0213 02/19/20 0415  WBC 2.7* 3.1* 3.4*  HGB 9.0*  9.0* 9.6*  HCT 29.2* 28.4* 31.1*  MCV 84.9 83.8 83.4  PLT 247 244 [id number]    Basic Metabolic Panel: Recent Labs  Lab 02/19/20 0415  NA 136  K 4.2  CL 102  CO2 25  GLUCOSE 112*  BUN 13  CREATININE 0.92  CALCIUM 9.2    GFR: Estimated Creatinine Clearance: 94.6 mL/min (by C-G formula based on SCr of 0.92 mg/dL).  Liver Function Tests: No results for input(s): AST, ALT, ALKPHOS, BILITOT, PROT, ALBUMIN in the last 168 hours. No results for input(s): LIPASE, AMYLASE in the last 168 hours. No results for input(s): AMMONIA in the last 168 hours.  Coagulation Profile: Recent Labs  Lab 02/13/20 0231 02/14/20 0213 02/15/20 0416 02/16/20 0426 02/18/20 0449  INR 2.8* 2.6* 2.3* 2.3* 2.3*    Cardiac Enzymes: No results for input(s): CKTOTAL, CKMB, CKMBINDEX, TROPONINI in the last 168 hours.  BNP (last 3 results) No results for input(s): PROBNP in the last 8760 hours.  Lipid  Profile: No results for input(s): CHOL, HDL, LDLCALC, TRIG, CHOLHDL, LDLDIRECT in the last 72 hours.  Thyroid Function Tests: No results for input(s): TSH, T4TOTAL, FREET4, T3FREE, THYROIDAB in the last 72 hours.  Anemia Panel: No results for input(s): VITAMINB12, FOLATE, FERRITIN, TIBC, IRON, RETICCTPCT in the last 72 hours.  Urine analysis:    Component Value Date/Time   COLORURINE YELLOW 01/27/2020 1841   APPEARANCEUR CLEAR 01/27/2020 1841   APPEARANCEUR Clear 08/03/2019 1143   LABSPEC 1.015 01/27/2020 1841   PHURINE 6.0 01/27/2020 1841   GLUCOSEU NEGATIVE 01/27/2020 1841   HGBUR SMALL (A) 01/27/2020 1841   BILIRUBINUR NEGATIVE 01/27/2020 1841   BILIRUBINUR small (A) 08/03/2019 1144   BILIRUBINUR Negative 08/03/2019 1143   KETONESUR 5 (A) 01/27/2020 1841   PROTEINUR 30 (A) 01/27/2020 1841   UROBILINOGEN 1.0 08/03/2019 1144   UROBILINOGEN 1.0 01/25/2013 0825   NITRITE NEGATIVE 01/27/2020 1841   LEUKOCYTESUR NEGATIVE 01/27/2020 1841    Sepsis Labs: Lactic Acid, Venous    Component Value  Date/Time   LATICACIDVEN 1.2 01/28/2020 0240    MICROBIOLOGY: No results found for this or any previous visit (from the past 240 hour(s)).  RADIOLOGY STUDIES/RESULTS: No results found.   LOS: 67 days   Phillips Climes, MD  [facility]    To contact the attending provider between 7A-7P or the covering provider during after hours 7P-7A, please log into the web site [email or web address] and access using universal [place] password for that web site. If you do not have the password, please call the hospital operator.  02/19/2020, 2:04 PM

## 2020-02-19 NOTE — Plan of Care (Signed)
  Problem: Education: Goal: Knowledge of General Education information will improve Description: Including pain rating scale, medication(s)/side effects and non-pharmacologic comfort measures Outcome: Progressing   Problem: Health Behavior/Discharge Planning: Goal: Ability to manage health-related needs will improve Outcome: Progressing   Problem: Clinical Measurements: Goal: Ability to maintain clinical measurements within normal limits will improve Outcome: Progressing Goal: Will remain free from infection Outcome: Progressing Goal: Diagnostic test results will improve Outcome: Progressing Goal: Respiratory complications will improve Outcome: Progressing Goal: Cardiovascular complication will be avoided Outcome: Progressing   Problem: Activity: Goal: Risk for activity intolerance will decrease Outcome: Progressing   Problem: Elimination: Goal: Will not experience complications related to bowel motility Outcome: Progressing   Problem: Pain Managment: Goal: General experience of comfort will improve Outcome: Progressing   Problem: Safety: Goal: Ability to remain free from injury will improve Outcome: Progressing   Problem: Skin Integrity: Goal: Risk for impaired skin integrity will decrease Outcome: Progressing

## 2020-02-19 NOTE — Progress Notes (Signed)
Physical Therapy Treatment Patient Details Name: Leonard Bentley MRN: [id number] DOB: 10-21-1961 Today's Date: 02/19/2020    History of Present Illness 59 y.o. male with what a recent left foot first and second ray amputation for osteomyelitis and discharged on March 10/27/2020 with history of minimally invasive mitral valve repair, chronic combined systolic and diastolic CHF last EF is improved to 55 to 60%, alcohol abuse, non-Hodgkin's lymphoma stage II in remission presents to the ER with complaint of increasing pain and discharge from the left foot. following L foot first and 2nd ray amputation.   X-rays show fracture involving the left third metatarsal concerning for osteomyelitis. Admitted 01/27/20 for amputation revision sx and s/p 3rd ray amputation on 01/30/20.    PT Comments    Pt demonstrating good progress.  Pt has barriers to d/c to SNF and is now WBAT thru heel.  He was able to ambulate 200' x 2 with supervision and perform 4 steps with min guard with good maintenance of weight thru heel only.  Pt does need cues for rest break and activity modification.  Pt has been transferring and ambulating independently in his room.   In regards to d/c plan, pt is making good progress.  He still expressed concern about going home and reported "barriers" but did not elaborate.  With WBAT pt's safety has significantly improved and he was able to perform stairs.  Do continue to recommend supervision at home for safety.  Did note increased edema in R LE.  Pt was unsure when this started.  Notified MD who reports pt on Warfarin so doubtful DVT and that pt typically sits with L LE in bed and R LE dangling.  Will encourage pt to elevate both legs.    Follow Up Recommendations  Home health PT;Supervision/Assistance - 24 hour     Equipment Recommendations  Rolling walker with 5" wheels    Recommendations for Other Services       Precautions / Restrictions Precautions Precautions: Fall Required Braces  or Orthoses: Other Brace Other Brace: post-op shoe Restrictions LLE Weight Bearing: Weight bearing as tolerated LLE Partial Weight Bearing Percentage or Pounds: weight bearing through heel in post op shoe    Mobility  Bed Mobility Overal bed mobility: Independent                Transfers Overall transfer level: Modified independent Equipment used: Rolling walker (2 wheeled) Transfers: Sit to/from Stand Sit to Stand: Modified independent (Device/Increase time)         General transfer comment: good technique while keeping weight through heel  Ambulation/Gait Ambulation/Gait assistance: Supervision Gait Distance (Feet): 200 Feet(x2) Assistive device: Rolling walker (2 wheeled) Gait Pattern/deviations: Step-to pattern Gait velocity: decr   General Gait Details: Good maintenance of WBing thru heel only.  Some tremors with fatigue needing cues for rest break.  HR 90-100 bpm with activity.   Stairs Stairs: Yes Stairs assistance: Min guard Stair Management: One rail Right;Step to pattern Number of Stairs: 4(x2) General stair comments: Performed 4 steps x 2 (standing rest break) with R rail up and L rail down with step to pattern "up with good and down with bad";  did well with WBing in heel only; min guard for steadying   Wheelchair Mobility    Modified Rankin (Stroke Patients Only)       Balance Overall balance assessment: Needs assistance Sitting-balance support: Feet supported Sitting balance-Leahy Scale: Normal     Standing balance support: Single extremity supported;During functional activity Standing  balance-Leahy Scale: Good                              Cognition Arousal/Alertness: Awake/alert Behavior During Therapy: WFL for tasks assessed/performed Overall Cognitive Status: Within Functional Limits for tasks assessed                                        Exercises      General Comments General comments (skin  integrity, edema, etc.): While ambulating noted increased edema throughout R thigh and lower leg.  Pt with no calf pain or warmth and no pain with dorsiflexion.    Notified MD of increased edema.      Pertinent Vitals/Pain Pain Assessment: No/denies pain    Home Living                      Prior Function            PT Goals (current goals can now be found in the care plan section) Acute Rehab PT Goals Patient Stated Goal: to be able to walker further, to improve PT Goal Formulation: With patient Time For Goal Achievement: 02/26/20 Potential to Achieve Goals: Good Progress towards PT goals: Progressing toward goals    Frequency    Min 3X/week      PT Plan Discharge plan needs to be updated;Frequency needs to be updated    Co-evaluation              AM-PAC PT "6 Clicks" Mobility   Outcome Measure  Help needed turning from your back to your side while in a flat bed without using bedrails?: None Help needed moving from lying on your back to sitting on the side of a flat bed without using bedrails?: None Help needed moving to and from a bed to a chair (including a wheelchair)?: None Help needed standing up from a chair using your arms (e.g., wheelchair or bedside chair)?: None Help needed to walk in hospital room?: None Help needed climbing 3-5 steps with a railing? : A Little 6 Click Score: 23    End of Session Equipment Utilized During Treatment: Gait belt Activity Tolerance: Patient tolerated treatment well Patient left: with call bell/phone within reach;in bed Nurse Communication: Mobility status PT Visit Diagnosis: Other abnormalities of gait and mobility (R26.89)     Time: 1540-1606 PT Time Calculation (min) (ACUTE ONLY): 26 min  Charges:  $Gait Training: 23-37 mins                     Maggie Font, PT Acute Rehab Services Pager [phone] [facility] [phone] [facility] [place] 02/19/2020, 4:57  PM

## 2020-02-20 LAB — PROTIME-INR
INR: 2.5 — ABNORMAL HIGH (ref 0.8–1.2)
Prothrombin Time: 27.2 seconds — ABNORMAL HIGH (ref 11.4–15.2)

## 2020-02-20 MED ORDER — GABAPENTIN 100 MG PO CAPS
100.0000 mg | ORAL_CAPSULE | Freq: Three times a day (TID) | ORAL | Status: DC
  Administered 2020-02-20 – 2020-02-25 (×15): 100 mg via ORAL
  Filled 2020-02-20 (×15): qty 1

## 2020-02-20 NOTE — Progress Notes (Signed)
ANTICOAGULATION CONSULT NOTE - Follow Up Consult  Pharmacy Consult for Coumadin Indication: h/o recurrent PE (04/2016), s/p mitral valve repair (10/2016) with annular ring prosthesis.   Allergies  Allergen Reactions  . Other Other (See Comments)    "Cactus" caused blisters on tongue (if prepared as food)    Patient Measurements: Height: 5\' 9"  (175.3 cm)(office visit 01/23/20) Weight: 89.6 kg (197 lb 8.5 oz) IBW/kg (Calculated) : 70.7  Vital Signs: Temp: 98 F (36.7 C) (04/14 0537) Temp Source: Oral (04/14 0537) BP: 112/81 (04/14 0537) Pulse Rate: 93 (04/14 0537)  Labs: Recent Labs    02/18/20 0449 02/19/20 0415 02/20/20 0404  HGB  --  9.6*  --   HCT  --  31.1*  --   PLT  --  264  --   LABPROT 25.2*  --  27.2*  INR 2.3*  --  2.5*  CREATININE  --  0.92  --     Estimated Creatinine Clearance: 94.6 mL/min (by C-G formula based on SCr of 0.92 mg/dL).  Assessment: Anticoag: Warfarin PTA for h/o recurrent PE (04/2016), s/p mitral valve repair (10/2016) with annular ring prosthesis. Ortho 3rd ray amputation 3/24>>resume heparin/warfarin. INR stable 2.5. Hgb 9.6 improved. Plts WNL Goal INR 2-3  Goal of Therapy:  INR 2-3 Monitor platelets by anticoagulation protocol: Yes   Plan:  Warfarin 7.5 mg daily INR MWF   Melyna Huron S. Alford Highland, PharmD, BCPS Clinical Staff Pharmacist [email or web address] Alford Highland, Chanah Tidmore [place] 02/20/2020,8:25 AM

## 2020-02-20 NOTE — Progress Notes (Signed)
   02/19/20 2142  Assess: MEWS Score  Temp 98.1 F (36.7 C)  BP (!) 123/92  Pulse Rate 79  Resp 19  Level of Consciousness Alert  SpO2 100 %  O2 Device Room Air  Patient Activity (if Appropriate) In bed  Assess: MEWS Score  MEWS Temp 0  MEWS Systolic 0  MEWS Pulse 0  MEWS RR 0  MEWS LOC 0  MEWS Score 0  MEWS Score Color Green  Assess: if the MEWS score is Yellow or Red  Were vital signs taken at a resting state? Yes  Focused Assessment Documented focused assessment  Early Detection of Sepsis Score *See Row Information* Low  MEWS guidelines implemented *See Row Information* No, other (Comment) (no acute changes)

## 2020-02-20 NOTE — TOC Progression Note (Signed)
Transition of Care [facility]) - Progression Note    Patient Details  Name: Leonard Bentley MRN: [id number] Date of Birth: 06/02/1961  [facility] of [facility] CM/SW Contact  Maryclare Labrador, RN Phone Number: 02/20/2020, 2:29 PM  Clinical Narrative:   Pt was able to successfully work on the steps yesterday per therapy.  Pt was also able to ambulate 200 x 2 with supervision.  CSW has not be able to secure SNF bed. Plan B will be for discharge to a residence if pt can secure 24 hour supervision.   Pt also confirms that his current home condition will not allow for a rolling walker to assist with mobility.   Pt agrees to contact family this evening in [place] area to ask if pt can stay with them.  CM probed about any family in the local area - pt told CM he has a step brother however brother has declined to provide recommended supervision.  CM discussed pt in LOS 02/20/20- [facility] in agreement to cover LOG for [facility] PT in [place] area.  CM submitted benefit check to CMA to determine possible accepting agencies.  Pt informed CM that he is active with Dr Archie Patten and that PTA he was able to afford his meds (generic meds) at the local [place].   CM will follow up with pt in the am.       Barriers to Discharge: Continued Medical Work up, SNF Pending bed offer, SNF Pending payor source - LOG  Expected Discharge Plan and Services   In-house Referral: Clinical Social Work, Risk manager Acute Care Choice: [place] Living arrangements for the past 2 months: [place]                                       Social Determinants of Health (SDOH) Interventions    Readmission Risk Interventions Readmission Risk Prevention Plan 01/31/2020 01/16/2020 01/18/2019  Transportation Screening Complete Complete Complete  PCP or Specialist Appt within 5-7 Days Complete Complete -  Home Care Screening Complete Complete -  Medication Review (RN CM) Complete  Complete -  Medication Review ([place]) - - Complete  HRI or [place] - - Not Applicable  Some recent data might be hidden

## 2020-02-20 NOTE — Progress Notes (Signed)
PROGRESS NOTE        PATIENT DETAILS Name: Leonard Bentley Age: 58 y.o. Sex: male Date of Birth: February 18, 1961 Admit Date: 01/27/2020 Admitting Physician [name], MD [id number], Vernia Buff, NP  Brief Narrative: Patient is a 59 y.o. male with history of chronic diastolic heart failure, severe MR s/p mitral valve repair, recurrent PE on Coumadin, non-Hodgkin's lymphoma, alcohol abuse recent hospitalization from 3/5-3/12 osteomyelitis-requiring left first and second ray amputation who presented with wound dehiscence (noncompliant to weightbearing status), drainage and newly displaced fracture of the third metatarsal head with underlying osteomyelitis.  Patient was evaluated by orthopedics and underwent left third ray amputation.  Significant events: 3/5-3/12>> admission for left foot osteomyelitis-s/p left first and second ray amputation 3/21>> admit for left foot wound dehiscence-third metatarsal head displaced fracture with possible underlying osteomyelitis   Antimicrobial therapy: Ceftriaxone: 3/21>> 3/24 Vancomycin: 3/21>> 3/25  Microbiology data: 3/21>> blood culture: Negative  Procedures : 3/24>> left third ray amputation  Consults: Orthopedics  DVT Prophylaxis : Full dose anticoagulation with Coumadin  Subjective: No major issues overnight-denies any chest pain or shortness of breath.  His right lower extremity is somewhat swollen-I examined him-both lower extremities appear symmetrical-do not see any swelling in his right leg..  Assessment/Plan:  Left foot wound dehiscence with left third metatarsal fracture with possible underlying osteomyelitis-s/p left third ray amputation:  - Orthopedics following-continue supportive care.  Per orthopedics-needs to be strict nonweightbearing on the left foot. -Dr. Waldron Labs discussed with Dr. [name] who reviewed imaging, reports sutures need to stay for another week, and patient may start to ambulate with  weightbearing as tolerated only to the left heel.  Hypomagnesemia:  - repleted.  Anemia of chronic disease . - no evidence of blood loss-I suspect this is secondary to inflammation related to chronic osteomyelitis/wound infection.  Follow for now.  Chronic diastolic heart failure: Compensated-follow volume status closely  History of severe MR-s/p mitral valve repair with annular ring prosthesis 2017: Stable-outpatient follow-up with cardiology  History of recurrent pulmonary embolism: Was on  overlapping heparin/Coumadin-INR now therapeutic-only on Coumadin.  Pharmacy following.  History of SVT/PACs: Stable-on beta-blocker.  History of tobacco use: Continue transdermal nicotine  History of non-Hodgkin's lymphoma in remission: Follow-up with oncology in the outpatient setting  Diet: Diet Order            Diet Carb Modified Fluid consistency: Thin; Room service appropriate? Yes  Diet effective now              Code Status: Full code  Family Communication: Patient to update family-he is awake and alert.  Disposition Plan:  SNF when ready for discharge,  Barriers to Discharge: Awaiting SNF bed, patient need SNF level per PT recommendation, unsafe for discharge home yet, appears to be improving, is currently able to tolerate weight in the heel, will need to ramp as well for his house, social worker involved to see if these issues can be resolved.  Antimicrobial agents: Anti-infectives (From admission, onward)   Start     Dose/Rate Route Frequency Ordered Stop   01/30/20 0830  ceFAZolin (ANCEF) IVPB 2g/100 mL premix  Status:  Discontinued     2 g 200 mL/hr over 30 Minutes Intravenous To Short Stay 01/30/20 0824 01/30/20 1330   01/28/20 1800  cefTRIAXone (ROCEPHIN) 2 g in sodium chloride 0.9 % 100 mL IVPB  2 g 200 mL/hr over 30 Minutes Intravenous Every 24 hours 01/27/20 2211 01/30/20 1900   01/28/20 0600  vancomycin (VANCOCIN) IVPB 1000 mg/200 mL premix     1,000  mg 200 mL/hr over 60 Minutes Intravenous Every 12 hours 01/27/20 1738 01/31/20 1006   01/27/20 1730  vancomycin (VANCOCIN) IVPB 1000 mg/200 mL premix  Status:  Discontinued     1,000 mg 200 mL/hr over 60 Minutes Intravenous  Once 01/27/20 1725 01/27/20 1726   01/27/20 1730  cefTRIAXone (ROCEPHIN) 2 g in sodium chloride 0.9 % 100 mL IVPB     2 g 200 mL/hr over 30 Minutes Intravenous  Once 01/27/20 1725 01/27/20 2010   01/27/20 1730  vancomycin (VANCOREADY) IVPB 2000 mg/400 mL     2,000 mg 200 mL/hr over 120 Minutes Intravenous  Once 01/27/20 1726 01/27/20 2219        MEDICATIONS: Scheduled Meds: . docusate sodium  100 mg Oral BID  . folic acid  1 mg Oral Daily  . loratadine  10 mg Oral Daily  . metoprolol tartrate  25 mg Oral BID  . multivitamin with minerals  1 tablet Oral Daily  . nicotine  14 mg Transdermal Daily  . thiamine  100 mg Oral Daily  . warfarin  7.5 mg Oral q1600  . Warfarin - Pharmacist Dosing Inpatient   Does not apply q1800   Continuous Infusions: . lactated ringers 10 mL/hr at 02/04/20 1304   PRN Meds:.acetaminophen **OR** acetaminophen, ALPRAZolam, metoprolol tartrate, ondansetron **OR** ondansetron (ZOFRAN) IV, oxyCODONE   PHYSICAL EXAM: Vital signs: Vitals:   02/19/20 1413 02/19/20 2142 02/20/20 0537 02/20/20 1234  BP: 140/89 (!) 123/92 112/81   Pulse: 78 79 93 89  Resp: 20 19 19 18   Temp: 98.3 F (36.8 C) 98.1 F (36.7 C) 98 F (36.7 C) 98.3 F (36.8 C)  TempSrc: Oral Oral Oral Oral  SpO2: 99% 100% 100% 100%  Weight:      Height:       Filed Weights   02/15/20 0437 02/16/20 0505 02/18/20 0319  Weight: 89 kg 89.1 kg 89.6 kg   Body mass index is 29.17 kg/m.   Awake, alert and oriented good air entry, clear to auscultation Heart regular rate and rhythm Soft abdomen Left foot bandaged, please see pictures below obtained on 4/11.        I have personally reviewed following labs and imaging studies  LABORATORY DATA: CBC: Recent  Labs  Lab 02/14/20 0213 02/19/20 0415  WBC 3.1* 3.4*  HGB 9.0* 9.6*  HCT 28.4* 31.1*  MCV 83.8 83.4  PLT 244 [id number]    Basic Metabolic Panel: Recent Labs  Lab 02/19/20 0415  NA 136  K 4.2  CL 102  CO2 25  GLUCOSE 112*  BUN 13  CREATININE 0.92  CALCIUM 9.2    GFR: Estimated Creatinine Clearance: 94.6 mL/min (by C-G formula based on SCr of 0.92 mg/dL).  Liver Function Tests: No results for input(s): AST, ALT, ALKPHOS, BILITOT, PROT, ALBUMIN in the last 168 hours. No results for input(s): LIPASE, AMYLASE in the last 168 hours. No results for input(s): AMMONIA in the last 168 hours.  Coagulation Profile: Recent Labs  Lab 02/14/20 0213 02/15/20 0416 02/16/20 0426 02/18/20 0449 02/20/20 0404  INR 2.6* 2.3* 2.3* 2.3* 2.5*    Cardiac Enzymes: No results for input(s): CKTOTAL, CKMB, CKMBINDEX, TROPONINI in the last 168 hours.  BNP (last 3 results) No results for input(s): PROBNP in the last 8760 hours.  Lipid  Profile: No results for input(s): CHOL, HDL, LDLCALC, TRIG, CHOLHDL, LDLDIRECT in the last 72 hours.  Thyroid Function Tests: No results for input(s): TSH, T4TOTAL, FREET4, T3FREE, THYROIDAB in the last 72 hours.  Anemia Panel: No results for input(s): VITAMINB12, FOLATE, FERRITIN, TIBC, IRON, RETICCTPCT in the last 72 hours.  Urine analysis:    Component Value Date/Time   COLORURINE YELLOW 01/27/2020 1841   APPEARANCEUR CLEAR 01/27/2020 1841   APPEARANCEUR Clear 08/03/2019 1143   LABSPEC 1.015 01/27/2020 1841   PHURINE 6.0 01/27/2020 1841   GLUCOSEU NEGATIVE 01/27/2020 1841   HGBUR SMALL (A) 01/27/2020 1841   BILIRUBINUR NEGATIVE 01/27/2020 1841   BILIRUBINUR small (A) 08/03/2019 1144   BILIRUBINUR Negative 08/03/2019 1143   KETONESUR 5 (A) 01/27/2020 1841   PROTEINUR 30 (A) 01/27/2020 1841   UROBILINOGEN 1.0 08/03/2019 1144   UROBILINOGEN 1.0 01/25/2013 0825   NITRITE NEGATIVE 01/27/2020 1841   LEUKOCYTESUR NEGATIVE 01/27/2020 1841    Sepsis  Labs: Lactic Acid, Venous    Component Value Date/Time   LATICACIDVEN 1.2 01/28/2020 0240    MICROBIOLOGY: No results found for this or any previous visit (from the past 240 hour(s)).  RADIOLOGY STUDIES/RESULTS: No results found.   LOS: 24 days   Oren Binet, MD  [facility]    To contact the attending provider between 7A-7P or the covering provider during after hours 7P-7A, please log into the web site [email or web address] and access using universal [place] password for that web site. If you do not have the password, please call the hospital operator.  02/20/2020, 4:03 PM

## 2020-02-20 NOTE — Plan of Care (Signed)
  Problem: Health Behavior/Discharge Planning: Goal: Ability to manage health-related needs will improve Outcome: Progressing   

## 2020-02-21 NOTE — Progress Notes (Signed)
Physical Therapy Treatment Patient Details Name: Leonard Bentley MRN: [id number] DOB: [birth year] Today's Date: 02/21/2020    History of Present Illness 59 y.o. male with what a recent left foot first and second ray amputation for osteomyelitis and discharged on March 10/27/2020 with history of minimally invasive mitral valve repair, chronic combined systolic and diastolic CHF last EF is improved to 55 to 60%, alcohol abuse, non-Hodgkin's lymphoma stage II in remission presents to the ER with complaint of increasing pain and discharge from the left foot. following L foot first and 2nd ray amputation.   X-rays show fracture involving the left third metatarsal concerning for osteomyelitis. Admitted 01/27/20 for amputation revision sx and s/p 3rd ray amputation on 01/30/20.    PT Comments    Pt demonstrating good progress with mobility.  He is improving at monitoring for fatigue (took standing rest break without cues), but still needed min cues at time for rest breaks.  Needed min cues for RW proximity with walking but doing excellent with only WBing in heel (pt only putting minimal weight in heel to allow for wound closure).  Educated on gradual progression of exercises and taking rest breaks.    Follow Up Recommendations  Home health PT;Supervision/Assistance - 24 hour     Equipment Recommendations  Rolling walker with 5" wheels    Recommendations for Other Services       Precautions / Restrictions Precautions Precautions: Fall Other Brace: post-op shoe Restrictions LLE Partial Weight Bearing Percentage or Pounds: weight bearing through heel in post op shoe    Mobility  Bed Mobility Overal bed mobility: Independent             General bed mobility comments: Pt standing on arrival  Transfers Overall transfer level: Modified independent Equipment used: Rolling walker (2 wheeled) Transfers: Sit to/from Stand Sit to Stand: Modified independent (Device/Increase time)          General transfer comment: good technique while keeping weight through heel  Ambulation/Gait Ambulation/Gait assistance: Supervision Gait Distance (Feet): 250 Feet Assistive device: Rolling walker (2 wheeled) Gait Pattern/deviations: Step-to pattern Gait velocity: decr   General Gait Details: Goo maintenance of WBing thru heel with more TDWB status compared to WBAT; did need some cues for RW proximity.   Stairs             Wheelchair Mobility    Modified Rankin (Stroke Patients Only)       Balance Overall balance assessment: Needs assistance Sitting-balance support: Feet supported Sitting balance-Leahy Scale: Normal     Standing balance support: Single extremity supported;During functional activity Standing balance-Leahy Scale: Good                              Cognition Arousal/Alertness: Awake/alert Behavior During Therapy: WFL for tasks assessed/performed Overall Cognitive Status: Within Functional Limits for tasks assessed Area of Impairment: Safety/judgement                               General Comments: pt continues to demonstrate decreased awareness of safety and monitoring of fatigue - required cues for rest      Exercises Other Exercises Other Exercises: standing dips/mini squat on RLE in RW x 15 with cues for technique (not letting knees go over toes) Other Exercises: Rows, forward punch, and tricep push downs with blue T band x 15    General Comments General comments (  skin integrity, edema, etc.): Pt reports keeping R leg elevated more - edema decreasing      Pertinent Vitals/Pain Pain Assessment: No/denies pain    Home Living                      Prior Function            PT Goals (current goals can now be found in the care plan section) Acute Rehab PT Goals Patient Stated Goal: to be able to walker further, to improve PT Goal Formulation: With patient Time For Goal Achievement: 03/06/20 Potential to  Achieve Goals: Good Progress towards PT goals: Progressing toward goals    Frequency    Min 3X/week      PT Plan Current plan remains appropriate    Co-evaluation              AM-PAC PT "6 Clicks" Mobility   Outcome Measure  Help needed turning from your back to your side while in a flat bed without using bedrails?: None Help needed moving from lying on your back to sitting on the side of a flat bed without using bedrails?: None Help needed moving to and from a bed to a chair (including a wheelchair)?: None Help needed standing up from a chair using your arms (e.g., wheelchair or bedside chair)?: None Help needed to walk in hospital room?: None Help needed climbing 3-5 steps with a railing? : A Little 6 Click Score: 23    End of Session Equipment Utilized During Treatment: Gait belt Activity Tolerance: Patient tolerated treatment well Patient left: with call bell/phone within reach;in chair Nurse Communication: Mobility status PT Visit Diagnosis: Other abnormalities of gait and mobility (R26.89)     Time: [id number] PT Time Calculation (min) (ACUTE ONLY): 27 min  Charges:  $Gait Training: 8-22 mins $Therapeutic Exercise: 8-22 mins                     Maggie Font, PT Acute Rehab Services Pager [phone] [facility] [phone] [facility] [place] 02/21/2020, 4:59 PM

## 2020-02-21 NOTE — Progress Notes (Signed)
PROGRESS NOTE        PATIENT DETAILS Name: Leonard Bentley Age: 59 y.o. Sex: male Date of Birth: 27-Apr-1961 Admit Date: 01/27/2020 Admitting Physician [name], MD [id number], Vernia Buff, NP  Brief Narrative: Patient is a 59 y.o. male with history of chronic diastolic heart failure, severe MR s/p mitral valve repair, recurrent PE on Coumadin, non-Hodgkin's lymphoma, alcohol abuse recent hospitalization from 3/5-3/12 osteomyelitis-requiring left first and second ray amputation who presented with wound dehiscence (noncompliant to weightbearing status), drainage and newly displaced fracture of the third metatarsal head with underlying osteomyelitis.  Patient was evaluated by orthopedics and underwent left third ray amputation.  Significant events: 3/5-3/12>> admission for left foot osteomyelitis-s/p left first and second ray amputation 3/21>> admit for left foot wound dehiscence-third metatarsal head displaced fracture with possible underlying osteomyelitis   Antimicrobial therapy: Ceftriaxone: 3/21>> 3/24 Vancomycin: 3/21>> 3/25  Microbiology data: 3/21>> blood culture: Negative  Procedures : 3/24>> left third ray amputation  Consults: Orthopedics  DVT Prophylaxis : Full dose anticoagulation with Coumadin  Subjective: No major issues overnight-denies any chest pain or shortness of breath.  His right lower extremity is somewhat swollen-I examined him-both lower extremities appear symmetrical-do not see any swelling in his right leg..  Assessment/Plan: Left foot wound dehiscence with left third metatarsal fracture with possible underlying osteomyelitis-s/p left third ray amputation:  Orthopedics following-continue supportive care.  Per orthopedics-needs to be strict nonweightbearing on the left foot.Dr. Waldron Labs discussed with Dr. [name] who reviewed imaging, reports sutures need to stay for another week, and patient may start to ambulate with  weightbearing as tolerated only to the left heel.  Hypomagnesemia:  repleted.  Anemia of chronic disease: no evidence of blood loss-I suspect this is secondary to inflammation related to chronic osteomyelitis/wound infection.  Follow for now.  Chronic diastolic heart failure: Compensated-follow volume status closely  History of severe MR-s/p mitral valve repair with annular ring prosthesis 2017: Stable-outpatient follow-up with cardiology  History of recurrent pulmonary embolism: Was on  overlapping heparin/Coumadin-INR now therapeutic-only on Coumadin.  Pharmacy following.  History of SVT/PACs: Stable-on beta-blocker.  History of tobacco use: Continue transdermal nicotine  History of non-Hodgkin's lymphoma in remission: Follow-up with oncology in the outpatient setting  Diet: Diet Order            Diet Carb Modified Fluid consistency: Thin; Room service appropriate? Yes  Diet effective now              Code Status: Full code  Family Communication: Patient to update family-he is awake and alert.  Disposition Plan: Likely home with home health over the weekend if arrangements can be made-see below  Barriers to Discharge: Initial recommendations from social work/PT was for SNF-recommendations are now for home health with 24-hour supervision.  Patient is attempting to go live with a family member in [place].  Case management will then attempt to set up home health in [place].  Antimicrobial agents: Anti-infectives (From admission, onward)   Start     Dose/Rate Route Frequency Ordered Stop   01/30/20 0830  ceFAZolin (ANCEF) IVPB 2g/100 mL premix  Status:  Discontinued     2 g 200 mL/hr over 30 Minutes Intravenous To Short Stay 01/30/20 0824 01/30/20 1330   01/28/20 1800  cefTRIAXone (ROCEPHIN) 2 g in sodium chloride 0.9 % 100 mL IVPB     2  g 200 mL/hr over 30 Minutes Intravenous Every 24 hours 01/27/20 2211 01/30/20 1900   01/28/20 0600  vancomycin (VANCOCIN)  IVPB 1000 mg/200 mL premix     1,000 mg 200 mL/hr over 60 Minutes Intravenous Every 12 hours 01/27/20 1738 01/31/20 1006   01/27/20 1730  vancomycin (VANCOCIN) IVPB 1000 mg/200 mL premix  Status:  Discontinued     1,000 mg 200 mL/hr over 60 Minutes Intravenous  Once 01/27/20 1725 01/27/20 1726   01/27/20 1730  cefTRIAXone (ROCEPHIN) 2 g in sodium chloride 0.9 % 100 mL IVPB     2 g 200 mL/hr over 30 Minutes Intravenous  Once 01/27/20 1725 01/27/20 2010   01/27/20 1730  vancomycin (VANCOREADY) IVPB 2000 mg/400 mL     2,000 mg 200 mL/hr over 120 Minutes Intravenous  Once 01/27/20 1726 01/27/20 2219        MEDICATIONS: Scheduled Meds: . docusate sodium  100 mg Oral BID  . folic acid  1 mg Oral Daily  . gabapentin  100 mg Oral TID  . loratadine  10 mg Oral Daily  . metoprolol tartrate  25 mg Oral BID  . multivitamin with minerals  1 tablet Oral Daily  . nicotine  14 mg Transdermal Daily  . thiamine  100 mg Oral Daily  . warfarin  7.5 mg Oral q1600  . Warfarin - Pharmacist Dosing Inpatient   Does not apply q1800   Continuous Infusions: . lactated ringers 10 mL/hr at 02/04/20 1304   PRN Meds:.acetaminophen **OR** acetaminophen, ALPRAZolam, metoprolol tartrate, ondansetron **OR** ondansetron (ZOFRAN) IV, oxyCODONE   PHYSICAL EXAM: Vital signs: Vitals:   02/20/20 2105 02/20/20 2313 02/21/20 0445 02/21/20 0449  BP: (!) 127/95 (!) 122/94 (!) 129/97   Pulse: 88 74 78   Resp: 19     Temp: 98 F (36.7 C)  98.3 F (36.8 C)   TempSrc: Oral  Oral   SpO2: 100%  100%   Weight:    93 kg  Height:       Filed Weights   02/16/20 0505 02/18/20 0319 02/21/20 0449  Weight: 89.1 kg 89.6 kg 93 kg   Body mass index is 30.28 kg/m.   Awake, alert and oriented good air entry, clear to auscultation Heart regular rate and rhythm Soft abdomen Left foot bandaged, please see pictures below obtained on 4/11.        I have personally reviewed following labs and imaging  studies  LABORATORY DATA: CBC: Recent Labs  Lab 02/19/20 0415  WBC 3.4*  HGB 9.6*  HCT 31.1*  MCV 83.4  PLT [id number]    Basic Metabolic Panel: Recent Labs  Lab 02/19/20 0415  NA 136  K 4.2  CL 102  CO2 25  GLUCOSE 112*  BUN 13  CREATININE 0.92  CALCIUM 9.2    GFR: Estimated Creatinine Clearance: 96.1 mL/min (by C-G formula based on SCr of 0.92 mg/dL).  Liver Function Tests: No results for input(s): AST, ALT, ALKPHOS, BILITOT, PROT, ALBUMIN in the last 168 hours. No results for input(s): LIPASE, AMYLASE in the last 168 hours. No results for input(s): AMMONIA in the last 168 hours.  Coagulation Profile: Recent Labs  Lab 02/15/20 0416 02/16/20 0426 02/18/20 0449 02/20/20 0404  INR 2.3* 2.3* 2.3* 2.5*    Cardiac Enzymes: No results for input(s): CKTOTAL, CKMB, CKMBINDEX, TROPONINI in the last 168 hours.  BNP (last 3 results) No results for input(s): PROBNP in the last 8760 hours.  Lipid Profile: No results for input(s): CHOL,  HDL, LDLCALC, TRIG, CHOLHDL, LDLDIRECT in the last 72 hours.  Thyroid Function Tests: No results for input(s): TSH, T4TOTAL, FREET4, T3FREE, THYROIDAB in the last 72 hours.  Anemia Panel: No results for input(s): VITAMINB12, FOLATE, FERRITIN, TIBC, IRON, RETICCTPCT in the last 72 hours.  Urine analysis:    Component Value Date/Time   COLORURINE YELLOW 01/27/2020 1841   APPEARANCEUR CLEAR 01/27/2020 1841   APPEARANCEUR Clear 08/03/2019 1143   LABSPEC 1.015 01/27/2020 1841   PHURINE 6.0 01/27/2020 1841   GLUCOSEU NEGATIVE 01/27/2020 1841   HGBUR SMALL (A) 01/27/2020 1841   BILIRUBINUR NEGATIVE 01/27/2020 1841   BILIRUBINUR small (A) 08/03/2019 1144   BILIRUBINUR Negative 08/03/2019 1143   KETONESUR 5 (A) 01/27/2020 1841   PROTEINUR 30 (A) 01/27/2020 1841   UROBILINOGEN 1.0 08/03/2019 1144   UROBILINOGEN 1.0 01/25/2013 0825   NITRITE NEGATIVE 01/27/2020 1841   LEUKOCYTESUR NEGATIVE 01/27/2020 1841    Sepsis Labs: Lactic  Acid, Venous    Component Value Date/Time   LATICACIDVEN 1.2 01/28/2020 0240    MICROBIOLOGY: No results found for this or any previous visit (from the past 240 hour(s)).  RADIOLOGY STUDIES/RESULTS: No results found.   LOS: 25 days   Oren Binet, MD  [facility]    To contact the attending provider between 7A-7P or the covering provider during after hours 7P-7A, please log into the web site [email or web address] and access using universal [place] password for that web site. If you do not have the password, please call the hospital operator.  02/21/2020, 3:11 PM

## 2020-02-22 LAB — PROTIME-INR
INR: 2.4 — ABNORMAL HIGH (ref 0.8–1.2)
Prothrombin Time: 26.2 seconds — ABNORMAL HIGH (ref 11.4–15.2)

## 2020-02-22 NOTE — Progress Notes (Signed)
PROGRESS NOTE        PATIENT DETAILS Name: Leonard Bentley Age: 59 y.o. Sex: male Date of Birth: 1961/10/25 Admit Date: 01/27/2020 Admitting Physician [name], MD IW:[id number], Vernia Buff, NP  Brief Narrative: Patient is a 59 y.o. male with history of chronic diastolic heart failure, severe MR s/p mitral valve repair, recurrent PE on Coumadin, non-Hodgkin's lymphoma, alcohol abuse recent hospitalization from 3/5-3/12 osteomyelitis-requiring left first and second ray amputation who presented with wound dehiscence (noncompliant to weightbearing status), drainage and newly displaced fracture of the third metatarsal head with underlying osteomyelitis.  Patient was evaluated by orthopedics and underwent left third ray amputation.  Significant events: 3/5-3/12>> admission for left foot osteomyelitis-s/p left first and second ray amputation 3/21>> admit for left foot wound dehiscence-third metatarsal head displaced fracture with possible underlying osteomyelitis   Antimicrobial therapy: Ceftriaxone: 3/21>> 3/24 Vancomycin: 3/21>> 3/25  Microbiology data: 3/21>> blood culture: Negative  Procedures : 3/24>> left third ray amputation  Consults: Orthopedics  DVT Prophylaxis : Full dose anticoagulation with Coumadin  Subjective: No major issues overnight  Assessment/Plan: Left foot wound dehiscence with left third metatarsal fracture with possible underlying osteomyelitis-s/p left third ray amputation: Slowly improving-evaluated by orthopedics on 4/16-sutures to be removed.  Continue touch heel bearing-until evaluated by Dr. [name] in the outpatient setting.  Hypomagnesemia:  repleted.  Anemia of chronic disease: no evidence of blood loss-I suspect this is secondary to inflammation related to chronic osteomyelitis/wound infection.  Follow for now.  Chronic diastolic heart failure: Compensated-follow volume status closely  History of severe MR-s/p mitral  valve repair with annular ring prosthesis 2017: Stable-outpatient follow-up with cardiology  History of recurrent pulmonary embolism: Was on  overlapping heparin/Coumadin-INR now therapeutic-only on Coumadin.  Pharmacy following.  History of SVT/PACs: Stable-on beta-blocker.  History of tobacco use: Continue transdermal nicotine  History of non-Hodgkin's lymphoma in remission: Follow-up with oncology in the outpatient setting  Diet: Diet Order            Diet Carb Modified Fluid consistency: Thin; Room service appropriate? Yes  Diet effective now              Code Status: Full code  Family Communication: Patient to update family-he is awake and alert.  Disposition Plan: Likely home with home health over the weekend if arrangements can be made-see below  Barriers to Discharge: Initial recommendations from social work/PT was for SNF-recommendations are now for home health with 24-hour supervision.  Patient is attempting to go live with a family member in [place].  Case management will then attempt to set up home health in [place].  Antimicrobial agents: Anti-infectives (From admission, onward)   Start     Dose/Rate Route Frequency Ordered Stop   01/30/20 0830  ceFAZolin (ANCEF) IVPB 2g/100 mL premix  Status:  Discontinued     2 g 200 mL/hr over 30 Minutes Intravenous To Short Stay 01/30/20 0824 01/30/20 1330   01/28/20 1800  cefTRIAXone (ROCEPHIN) 2 g in sodium chloride 0.9 % 100 mL IVPB     2 g 200 mL/hr over 30 Minutes Intravenous Every 24 hours 01/27/20 2211 01/30/20 1900   01/28/20 0600  vancomycin (VANCOCIN) IVPB 1000 mg/200 mL premix     1,000 mg 200 mL/hr over 60 Minutes Intravenous Every 12 hours 01/27/20 1738 01/31/20 1006   01/27/20 1730  vancomycin (VANCOCIN) IVPB 1000  mg/200 mL premix  Status:  Discontinued     1,000 mg 200 mL/hr over 60 Minutes Intravenous  Once 01/27/20 1725 01/27/20 1726   01/27/20 1730  cefTRIAXone (ROCEPHIN) 2 g in sodium  chloride 0.9 % 100 mL IVPB     2 g 200 mL/hr over 30 Minutes Intravenous  Once 01/27/20 1725 01/27/20 2010   01/27/20 1730  vancomycin (VANCOREADY) IVPB 2000 mg/400 mL     2,000 mg 200 mL/hr over 120 Minutes Intravenous  Once 01/27/20 1726 01/27/20 2219        MEDICATIONS: Scheduled Meds: . docusate sodium  100 mg Oral BID  . folic acid  1 mg Oral Daily  . gabapentin  100 mg Oral TID  . loratadine  10 mg Oral Daily  . metoprolol tartrate  25 mg Oral BID  . multivitamin with minerals  1 tablet Oral Daily  . nicotine  14 mg Transdermal Daily  . thiamine  100 mg Oral Daily  . warfarin  7.5 mg Oral q1600  . Warfarin - Pharmacist Dosing Inpatient   Does not apply q1800   Continuous Infusions: . lactated ringers 10 mL/hr at 02/04/20 1304   PRN Meds:.acetaminophen **OR** acetaminophen, ALPRAZolam, metoprolol tartrate, ondansetron **OR** ondansetron (ZOFRAN) IV, oxyCODONE   PHYSICAL EXAM: Vital signs: Vitals:   02/22/20 0157 02/22/20 0417 02/22/20 0912 02/22/20 1425  BP: (!) 117/92 (!) 113/92 113/89 (!) 117/93  Pulse: 73 65  85  Resp:  18  20  Temp:  97.7 F (36.5 C)  98 F (36.7 C)  TempSrc:  Oral  Oral  SpO2: 100% 98%  92%  Weight:  87.5 kg    Height:       Filed Weights   02/18/20 0319 02/21/20 0449 02/22/20 0417  Weight: 89.6 kg 93 kg 87.5 kg   Body mass index is 28.5 kg/m.   Awake, alert and oriented good air entry, clear to auscultation Heart regular rate and rhythm Soft abdomen Left foot bandaged, please see pictures below obtained on 4/11.        I have personally reviewed following labs and imaging studies  LABORATORY DATA: CBC: Recent Labs  Lab 02/19/20 0415  WBC 3.4*  HGB 9.6*  HCT 31.1*  MCV 83.4  PLT [id number]    Basic Metabolic Panel: Recent Labs  Lab 02/19/20 0415  NA 136  K 4.2  CL 102  CO2 25  GLUCOSE 112*  BUN 13  CREATININE 0.92  CALCIUM 9.2    GFR: Estimated Creatinine Clearance: 93.5 mL/min (by C-G formula based on  SCr of 0.92 mg/dL).  Liver Function Tests: No results for input(s): AST, ALT, ALKPHOS, BILITOT, PROT, ALBUMIN in the last 168 hours. No results for input(s): LIPASE, AMYLASE in the last 168 hours. No results for input(s): AMMONIA in the last 168 hours.  Coagulation Profile: Recent Labs  Lab 02/16/20 0426 02/18/20 0449 02/20/20 0404 02/22/20 0529  INR 2.3* 2.3* 2.5* 2.4*    Cardiac Enzymes: No results for input(s): CKTOTAL, CKMB, CKMBINDEX, TROPONINI in the last 168 hours.  BNP (last 3 results) No results for input(s): PROBNP in the last 8760 hours.  Lipid Profile: No results for input(s): CHOL, HDL, LDLCALC, TRIG, CHOLHDL, LDLDIRECT in the last 72 hours.  Thyroid Function Tests: No results for input(s): TSH, T4TOTAL, FREET4, T3FREE, THYROIDAB in the last 72 hours.  Anemia Panel: No results for input(s): VITAMINB12, FOLATE, FERRITIN, TIBC, IRON, RETICCTPCT in the last 72 hours.  Urine analysis:    Component Value  Date/Time   COLORURINE YELLOW 01/27/2020 1841   APPEARANCEUR CLEAR 01/27/2020 1841   APPEARANCEUR Clear 08/03/2019 1143   LABSPEC 1.015 01/27/2020 1841   PHURINE 6.0 01/27/2020 1841   GLUCOSEU NEGATIVE 01/27/2020 1841   HGBUR SMALL (A) 01/27/2020 1841   BILIRUBINUR NEGATIVE 01/27/2020 1841   BILIRUBINUR small (A) 08/03/2019 1144   BILIRUBINUR Negative 08/03/2019 1143   KETONESUR 5 (A) 01/27/2020 1841   PROTEINUR 30 (A) 01/27/2020 1841   UROBILINOGEN 1.0 08/03/2019 1144   UROBILINOGEN 1.0 01/25/2013 0825   NITRITE NEGATIVE 01/27/2020 1841   LEUKOCYTESUR NEGATIVE 01/27/2020 1841    Sepsis Labs: Lactic Acid, Venous    Component Value Date/Time   LATICACIDVEN 1.2 01/28/2020 0240    MICROBIOLOGY: No results found for this or any previous visit (from the past 240 hour(s)).  RADIOLOGY STUDIES/RESULTS: No results found.   LOS: 26 days   Oren Binet, MD  [facility]    To contact the attending provider between 7A-7P or the covering  provider during after hours 7P-7A, please log into the web site [email or web address] and access using universal [place] password for that web site. If you do not have the password, please call the hospital operator.  02/22/2020, 3:42 PM

## 2020-02-22 NOTE — Progress Notes (Signed)
23 days s/p left foot 3rd ray amputation. Doing well.  Left foot good approximation of incision without dehiscence. Incision healed. No surrounding cellulitis  Will order suture removal. Can wash with mild soap and water in shower. Should follow up with Dr [name] about a week after discharge and will advance to full weightbearing at that time

## 2020-02-22 NOTE — Progress Notes (Signed)
Occupational Therapy Treatment Patient Details Name: Leonard Bentley MRN: [id number] DOB: Apr 07, 1961 Today's Date: 02/22/2020    History of present illness 59 y.o. male with what a recent left foot first and second ray amputation for osteomyelitis and discharged on March 10/27/2020 with history of minimally invasive mitral valve repair, chronic combined systolic and diastolic CHF last EF is improved to 55 to 60%, alcohol abuse, non-Hodgkin's lymphoma stage II in remission presents to the ER with complaint of increasing pain and discharge from the left foot. following L foot first and 2nd ray amputation.   X-rays show fracture involving the left third metatarsal concerning for osteomyelitis. Admitted 01/27/20 for amputation revision sx and s/p 3rd ray amputation on 01/30/20.   OT comments  Pt. Seen for skilled OT treatment.  Pt. Able to demonstrate safety during seated and standing grooming tasks.  S for ambulation to/from b.room for toileting task.  Set up seated LB dressing.  Pt. Reports he has no questions or concerns for his abilities to complete self care tasks.  Reports he will be staying with friends upon d/c prior to home.  Pt. Clear for d/c from acute OT.  Reviewed this with pt. And he verbalized understanding and agreement.  Will alert OTR/L to sign off.    Follow Up Recommendations  SNF    Equipment Recommendations  3 in 1 bedside commode    Recommendations for Other Services      Precautions / Restrictions Precautions Precautions: Fall Other Brace: post-op shoe Restrictions Weight Bearing Restrictions: Yes LLE Weight Bearing: Partial weight bearing LLE Partial Weight Bearing Percentage or Pounds: weight bearing through heel in post op shoe Other Position/Activity Restrictions: post op shoe       Mobility Bed Mobility               General bed mobility comments: seated eob begining and end of session  Transfers Overall transfer level: Needs assistance   Transfers: Sit  to/from Stand Sit to Stand: Modified independent (Device/Increase time) Stand pivot transfers: Supervision       General transfer comment: good technique while keeping weight through heel    Balance                                           ADL either performed or assessed with clinical judgement   ADL Overall ADL's : Needs assistance/impaired     Grooming: Supervision/safety;Sitting;Standing Grooming Details (indicate cue type and reason): pt. provided demo of how he utilizes ue support on counter when completing tasks in standing and also how he positions and sits in chair for seated tasks.  reports that now he mostly sits for grooming tasks but did have to stand a few times while shaving to reach his chin better.  utilized rw for [employer] pushed against the counter without any cues             Lower Body Dressing: Set up;Sitting/lateral leans Lower Body Dressing Details (indicate cue type and reason): donned post op shoe Toilet Transfer: Ambulation;RW;Supervision/safety Armed forces technical officer Details (indicate cue type and reason): S during ambulation no cues required for managing wbs         Functional mobility during ADLs: Rolling walker;Supervision/safety General ADL Comments: pt. able to maintain wbs with post op shoe during ambulation.  good recall of precautions and able to verbalize strategies and set up for a  safe environment once home     Vision       Perception     Praxis      Cognition Arousal/Alertness: Awake/alert Behavior During Therapy: WFL for tasks assessed/performed Overall Cognitive Status: Within Functional Limits for tasks assessed                                          Exercises     Shoulder Instructions       General Comments      Pertinent Vitals/ Pain       Pain Assessment: No/denies pain Pain Intervention(s): Premedicated before session  Home Living                                           Prior Functioning/Environment              Frequency  Min 2X/week        Progress Toward Goals  OT Goals(current goals can now be found in the care plan section)  Progress towards OT goals: Goals met/education completed, patient discharged from [place] Discharge plan remains appropriate    Co-evaluation                 AM-PAC OT "6 Clicks" Daily Activity     Outcome Measure   Help from another person eating meals?: None Help from another person taking care of personal grooming?: A Little Help from another person toileting, which includes using toliet, bedpan, or urinal?: A Little Help from another person bathing (including washing, rinsing, drying)?: None Help from another person to put on and taking off regular upper body clothing?: None Help from another person to put on and taking off regular lower body clothing?: None 6 Click Score: 22    End of Session Equipment Utilized During Treatment: Rolling walker  OT Visit Diagnosis: Unsteadiness on feet (R26.81);Other abnormalities of gait and mobility (R26.89) Pain - Right/Left: Left Pain - part of body: Ankle and joints of foot   Activity Tolerance Patient tolerated treatment well   Patient Left in bed;with call bell/phone within reach   Nurse Communication          Time: 4360-6770 OT Time Calculation (min): 34 min  Charges: OT General Charges $OT Visit: 1 Visit OT Treatments $Self Care/Home Management : 23-37 mins  Sonia Baller, COTA/L Acute Rehabilitation [phone]   Janice Coffin 02/22/2020, 11:43 AM

## 2020-02-22 NOTE — Progress Notes (Signed)
Sutures removed from pts left foot per order

## 2020-02-22 NOTE — Progress Notes (Signed)
ANTICOAGULATION CONSULT NOTE - Follow Up Consult  Pharmacy Consult for Coumadin Indication: h/o recurrent PE (04/2016), s/p mitral valve repair (10/2016) with annular ring prosthesis.   Allergies  Allergen Reactions  . Other Other (See Comments)    "Cactus" caused blisters on tongue (if prepared as food)    Patient Measurements: Height: 5\' 9"  (175.3 cm)(office visit 01/23/20) Weight: 87.5 kg (193 lb) IBW/kg (Calculated) : 70.7  Vital Signs: Temp: 97.7 F (36.5 C) (04/16 0417) Temp Source: Oral (04/16 0417) BP: 113/92 (04/16 0417) Pulse Rate: 65 (04/16 0417)  Labs: Recent Labs    02/20/20 0404 02/22/20 0529  LABPROT 27.2* 26.2*  INR 2.5* 2.4*    Estimated Creatinine Clearance: 93.5 mL/min (by C-G formula based on SCr of 0.92 mg/dL).  Assessment: Anticoag: Warfarin PTA for h/o recurrent PE (04/2016), s/p mitral valve repair (10/2016) with annular ring prosthesis. Ortho 3rd ray amputation 3/24>>resume heparin/warfarin. INR stable at 2.4. Last hemoglobin is 9.6, improved on 4/14. Platelets are within normal limits.  Goal INR 2-3.   Goal of Therapy:  INR 2-3 Monitor platelets by anticoagulation protocol: Yes   Plan:  Warfarin 7.5 mg daily INR MWF   Sloan Leiter, PharmD, BCPS, BCCCP Clinical Pharmacist Please refer to [facility] for [place] numbers 02/22/2020,8:08 AM

## 2020-02-22 NOTE — TOC Progression Note (Signed)
Transition of Care [facility]) - Progression Note    Patient Details  Name: Leonard Bentley MRN: [id number] Date of Birth: 05/20/1961  Transition of Care [facility]) CM/SW Contact  Maryclare Labrador, RN Phone Number: 02/22/2020, 11:47 AM  Clinical Narrative:    Pt informs that his family can take him to [place] and provide 24 hour supervision.  However pt still with the following barriers with discharge; CM exhausted options for a HH in the [place] area to accept LOG for HHPT.  CM also unable to establish care with a clinic in the [place] area that will accept non insured pts - first available appts are in September 21 for new pts.  [place] agencies require a local PCP to sign orders. Also, per attending pt will need INR checks with coumadin.  CM informed attending.    CSW will continue search for SNF.      Barriers to Discharge: Continued Medical Work up, SNF Pending bed offer, SNF Pending payor source - [place]  Expected Discharge Plan and Services   In-house Referral: Clinical Social Work, Risk manager Acute Care Choice: [place] Living arrangements for the past 2 months: [place] Determinants of Health (SDOH) Interventions    Readmission Risk Interventions Readmission Risk Prevention Plan 01/31/2020 01/16/2020 01/18/2019  Transportation Screening Complete Complete Complete  PCP or Specialist Appt within 5-7 Days Complete Complete -  Home Care Screening Complete Complete -  Medication Review (RN CM) Complete Complete -  Medication Review ([place]) - - Complete  HRI or [place] - - Complete  [place] - - Not Applicable  Some recent data might be hidden

## 2020-02-23 NOTE — Progress Notes (Signed)
PROGRESS NOTE        PATIENT DETAILS Name: Leonard Bentley Age: 59 y.o. Sex: male Date of Birth: 08/02/1961 Admit Date: 01/27/2020 Admitting Physician [name], MD IW:[id number], Vernia Buff, NP  Brief Narrative: Patient is a 59 y.o. male with history of chronic diastolic heart failure, severe MR s/p mitral valve repair, recurrent PE on Coumadin, non-Hodgkin's lymphoma, alcohol abuse recent hospitalization from 3/5-3/12 osteomyelitis-requiring left first and second ray amputation who presented with wound dehiscence (noncompliant to weightbearing status), drainage and newly displaced fracture of the third metatarsal head with underlying osteomyelitis.  Patient was evaluated by orthopedics and underwent left third ray amputation.  Significant events: 3/5-3/12>> admission for left foot osteomyelitis-s/p left first and second ray amputation 3/21>> admit for left foot wound dehiscence-third metatarsal head displaced fracture with possible underlying osteomyelitis   Antimicrobial therapy: Ceftriaxone: 3/21>> 3/24 Vancomycin: 3/21>> 3/25  Microbiology data: 3/21>> blood culture: Negative  Procedures : 3/24>> left third ray amputation  Consults: Orthopedics  DVT Prophylaxis : Full dose anticoagulation with Coumadin  Subjective: No major issues overnight-seen earlier this morning-he was lying comfortably in bed.  Subsequently this afternoon-I saw this patient ambulating in the hallway with the help of a walker-he was barely bearing weight on his left heel.  Assessment/Plan: Left foot wound dehiscence with left third metatarsal fracture with possible underlying osteomyelitis-s/p left third ray amputation: Slowly improving-evaluated by orthopedics on 4/16-sutures to be removed.  Continue touch heel bearing-until evaluated by Dr. [name] in the outpatient setting.  Hypomagnesemia:  repleted.  Anemia of chronic disease: no evidence of blood loss-I suspect this is  secondary to inflammation related to chronic osteomyelitis/wound infection.  Follow for now.  Chronic diastolic heart failure: Compensated-follow volume status closely  History of severe MR-s/p mitral valve repair with annular ring prosthesis 2017: Stable-outpatient follow-up with cardiology  History of recurrent pulmonary embolism: Was on  overlapping heparin/Coumadin-INR now therapeutic-only on Coumadin.  Pharmacy following.  History of SVT/PACs: Stable-on beta-blocker.  History of tobacco use: Continue transdermal nicotine  History of non-Hodgkin's lymphoma in remission: Follow-up with oncology in the outpatient setting  Diet: Diet Order            Diet Carb Modified Fluid consistency: Thin; Room service appropriate? Yes  Diet effective now              Code Status: Full code  Family Communication: Patient to update family-he is awake and alert.  Disposition Plan: Likely home with home health over the weekend if arrangements can be made-see below  Barriers to Discharge: Initial recommendations from social work/PT was for SNF-recommendations are now for home health with 24-hour supervision.  Patient is attempting to go live with a family member in [place].  Case management attempted to set up home health in [place] this has not been possible.,  Discussion with patient-since he feels better-he his skin/requesting discharge on Monday without home health-he will go live with family members in [place] that will provide 24/7 supervision.    Antimicrobial agents: Anti-infectives (From admission, onward)   Start     Dose/Rate Route Frequency Ordered Stop   01/30/20 0830  ceFAZolin (ANCEF) IVPB 2g/100 mL premix  Status:  Discontinued     2 g 200 mL/hr over 30 Minutes Intravenous To Short Stay 01/30/20 0824 01/30/20 1330   01/28/20 1800  cefTRIAXone (ROCEPHIN) 2 g in  sodium chloride 0.9 % 100 mL IVPB     2 g 200 mL/hr over 30 Minutes Intravenous Every 24  hours 01/27/20 2211 01/30/20 1900   01/28/20 0600  vancomycin (VANCOCIN) IVPB 1000 mg/200 mL premix     1,000 mg 200 mL/hr over 60 Minutes Intravenous Every 12 hours 01/27/20 1738 01/31/20 1006   01/27/20 1730  vancomycin (VANCOCIN) IVPB 1000 mg/200 mL premix  Status:  Discontinued     1,000 mg 200 mL/hr over 60 Minutes Intravenous  Once 01/27/20 1725 01/27/20 1726   01/27/20 1730  cefTRIAXone (ROCEPHIN) 2 g in sodium chloride 0.9 % 100 mL IVPB     2 g 200 mL/hr over 30 Minutes Intravenous  Once 01/27/20 1725 01/27/20 2010   01/27/20 1730  vancomycin (VANCOREADY) IVPB 2000 mg/400 mL     2,000 mg 200 mL/hr over 120 Minutes Intravenous  Once 01/27/20 1726 01/27/20 2219        MEDICATIONS: Scheduled Meds: . docusate sodium  100 mg Oral BID  . folic acid  1 mg Oral Daily  . gabapentin  100 mg Oral TID  . loratadine  10 mg Oral Daily  . metoprolol tartrate  25 mg Oral BID  . multivitamin with minerals  1 tablet Oral Daily  . nicotine  14 mg Transdermal Daily  . thiamine  100 mg Oral Daily  . warfarin  7.5 mg Oral q1600  . Warfarin - Pharmacist Dosing Inpatient   Does not apply q1800   Continuous Infusions: . lactated ringers 10 mL/hr at 02/04/20 1304   PRN Meds:.acetaminophen **OR** acetaminophen, ALPRAZolam, metoprolol tartrate, ondansetron **OR** ondansetron (ZOFRAN) IV, oxyCODONE   PHYSICAL EXAM: Vital signs: Vitals:   02/22/20 1425 02/22/20 2203 02/23/20 0425 02/23/20 1155  BP: (!) 117/93 104/80 107/67 103/77  Pulse: 85 90 81 78  Resp: 20 18 18 17   Temp: 98 F (36.7 C) 98.5 F (36.9 C) 98.2 F (36.8 C) 98.2 F (36.8 C)  TempSrc: Oral Oral Oral Oral  SpO2: 92% 92% 99% 100%  Weight:   87.4 kg   Height:       Filed Weights   02/21/20 0449 02/22/20 0417 02/23/20 0425  Weight: 93 kg 87.5 kg 87.4 kg   Body mass index is 28.44 kg/m.   Awake, alert and oriented good air entry, clear to auscultation Heart regular rate and rhythm Soft abdomen Left foot bandaged,  please see pictures below obtained on 4/11.        I have personally reviewed following labs and imaging studies  LABORATORY DATA: CBC: Recent Labs  Lab 02/19/20 0415  WBC 3.4*  HGB 9.6*  HCT 31.1*  MCV 83.4  PLT [id number]    Basic Metabolic Panel: Recent Labs  Lab 02/19/20 0415  NA 136  K 4.2  CL 102  CO2 25  GLUCOSE 112*  BUN 13  CREATININE 0.92  CALCIUM 9.2    GFR: Estimated Creatinine Clearance: 93.5 mL/min (by C-G formula based on SCr of 0.92 mg/dL).  Liver Function Tests: No results for input(s): AST, ALT, ALKPHOS, BILITOT, PROT, ALBUMIN in the last 168 hours. No results for input(s): LIPASE, AMYLASE in the last 168 hours. No results for input(s): AMMONIA in the last 168 hours.  Coagulation Profile: Recent Labs  Lab 02/18/20 0449 02/20/20 0404 02/22/20 0529  INR 2.3* 2.5* 2.4*    Cardiac Enzymes: No results for input(s): CKTOTAL, CKMB, CKMBINDEX, TROPONINI in the last 168 hours.  BNP (last 3 results) No results for input(s): PROBNP  in the last 8760 hours.  Lipid Profile: No results for input(s): CHOL, HDL, LDLCALC, TRIG, CHOLHDL, LDLDIRECT in the last 72 hours.  Thyroid Function Tests: No results for input(s): TSH, T4TOTAL, FREET4, T3FREE, THYROIDAB in the last 72 hours.  Anemia Panel: No results for input(s): VITAMINB12, FOLATE, FERRITIN, TIBC, IRON, RETICCTPCT in the last 72 hours.  Urine analysis:    Component Value Date/Time   COLORURINE YELLOW 01/27/2020 1841   APPEARANCEUR CLEAR 01/27/2020 1841   APPEARANCEUR Clear 08/03/2019 1143   LABSPEC 1.015 01/27/2020 1841   PHURINE 6.0 01/27/2020 1841   GLUCOSEU NEGATIVE 01/27/2020 1841   HGBUR SMALL (A) 01/27/2020 1841   BILIRUBINUR NEGATIVE 01/27/2020 1841   BILIRUBINUR small (A) 08/03/2019 1144   BILIRUBINUR Negative 08/03/2019 1143   KETONESUR 5 (A) 01/27/2020 1841   PROTEINUR 30 (A) 01/27/2020 1841   UROBILINOGEN 1.0 08/03/2019 1144   UROBILINOGEN 1.0 01/25/2013 0825   NITRITE  NEGATIVE 01/27/2020 1841   LEUKOCYTESUR NEGATIVE 01/27/2020 1841    Sepsis Labs: Lactic Acid, Venous    Component Value Date/Time   LATICACIDVEN 1.2 01/28/2020 0240    MICROBIOLOGY: No results found for this or any previous visit (from the past 240 hour(s)).  RADIOLOGY STUDIES/RESULTS: No results found.   LOS: 27 days   Oren Binet, MD  [facility]    To contact the attending provider between 7A-7P or the covering provider during after hours 7P-7A, please log into the web site [email or web address] and access using universal [place] password for that web site. If you do not have the password, please call the hospital operator.  02/23/2020, 3:40 PM

## 2020-02-24 NOTE — Progress Notes (Signed)
PROGRESS NOTE        PATIENT DETAILS Name: Leonard Bentley Age: 59 y.o. Sex: male Date of Birth: 20-Sep-1961 Admit Date: 01/27/2020 Admitting Physician [name], MD IW:[id number], Vernia Buff, NP  Brief Narrative: Patient is a 59 y.o. male with history of chronic diastolic heart failure, severe MR s/p mitral valve repair, recurrent PE on Coumadin, non-Hodgkin's lymphoma, alcohol abuse recent hospitalization from 3/5-3/12 osteomyelitis-requiring left first and second ray amputation who presented with wound dehiscence (noncompliant to weightbearing status), drainage and newly displaced fracture of the third metatarsal head with underlying osteomyelitis.  Patient was evaluated by orthopedics and underwent left third ray amputation.  Significant events: 3/5-3/12>> admission for left foot osteomyelitis-s/p left first and second ray amputation 3/21>> admit for left foot wound dehiscence-third metatarsal head displaced fracture with possible underlying osteomyelitis   Antimicrobial therapy: Ceftriaxone: 3/21>> 3/24 Vancomycin: 3/21>> 3/25  Microbiology data: 3/21>> blood culture: Negative  Procedures : 3/24>> left third ray amputation  Consults: Orthopedics  DVT Prophylaxis : Full dose anticoagulation with Coumadin  Subjective: No major issues overnight.  Confirms that he will be leaving the hospital tomorrow.  Assessment/Plan: Left foot wound dehiscence with left third metatarsal fracture with possible underlying osteomyelitis-s/p left third ray amputation: Slowly improving-evaluated by orthopedics on 4/16-sutures to be removed.  Continue touch heel bearing-until evaluated by Dr. [name] in the outpatient setting.  Hypomagnesemia:  repleted.  Anemia of chronic disease: no evidence of blood loss-I suspect this is secondary to inflammation related to chronic osteomyelitis/wound infection.  Follow for now.  Chronic diastolic heart failure: Compensated-follow  volume status closely  History of severe MR-s/p mitral valve repair with annular ring prosthesis 2017: Stable-outpatient follow-up with cardiology  History of recurrent pulmonary embolism: Was on  overlapping heparin/Coumadin-INR now therapeutic-only on Coumadin.  Pharmacy following.  History of SVT/PACs: Stable-on beta-blocker.  History of tobacco use: Continue transdermal nicotine  History of non-Hodgkin's lymphoma in remission: Follow-up with oncology in the outpatient setting  Diet: Diet Order            Diet Carb Modified Fluid consistency: Thin; Room service appropriate? Yes  Diet effective now              Code Status: Full code  Family Communication: Patient to update family-he is awake and alert.  Disposition Plan: Likely home with home health over the weekend if arrangements can be made-see below  Barriers to Discharge: Initial recommendations from social work/PT was for SNF-recommendations are now for home health with 24-hour supervision.  Patient is attempting to go live with a family member in [place].  Case management attempted to set up home health in [place] this has not been possible.,  Discussion with patient-since he feels better-he his skin/requesting discharge on Monday without home health-he will go live with family members in [place] that will provide 24/7 supervision.    Antimicrobial agents: Anti-infectives (From admission, onward)   Start     Dose/Rate Route Frequency Ordered Stop   01/30/20 0830  ceFAZolin (ANCEF) IVPB 2g/100 mL premix  Status:  Discontinued     2 g 200 mL/hr over 30 Minutes Intravenous To Short Stay 01/30/20 0824 01/30/20 1330   01/28/20 1800  cefTRIAXone (ROCEPHIN) 2 g in sodium chloride 0.9 % 100 mL IVPB     2 g 200 mL/hr over 30 Minutes Intravenous Every 24 hours 01/27/20  2211 01/30/20 1900   01/28/20 0600  vancomycin (VANCOCIN) IVPB 1000 mg/200 mL premix     1,000 mg 200 mL/hr over 60 Minutes  Intravenous Every 12 hours 01/27/20 1738 01/31/20 1006   01/27/20 1730  vancomycin (VANCOCIN) IVPB 1000 mg/200 mL premix  Status:  Discontinued     1,000 mg 200 mL/hr over 60 Minutes Intravenous  Once 01/27/20 1725 01/27/20 1726   01/27/20 1730  cefTRIAXone (ROCEPHIN) 2 g in sodium chloride 0.9 % 100 mL IVPB     2 g 200 mL/hr over 30 Minutes Intravenous  Once 01/27/20 1725 01/27/20 2010   01/27/20 1730  vancomycin (VANCOREADY) IVPB 2000 mg/400 mL     2,000 mg 200 mL/hr over 120 Minutes Intravenous  Once 01/27/20 1726 01/27/20 2219        MEDICATIONS: Scheduled Meds: . docusate sodium  100 mg Oral BID  . folic acid  1 mg Oral Daily  . gabapentin  100 mg Oral TID  . loratadine  10 mg Oral Daily  . metoprolol tartrate  25 mg Oral BID  . multivitamin with minerals  1 tablet Oral Daily  . nicotine  14 mg Transdermal Daily  . thiamine  100 mg Oral Daily  . warfarin  7.5 mg Oral q1600  . Warfarin - Pharmacist Dosing Inpatient   Does not apply q1800   Continuous Infusions: . lactated ringers 10 mL/hr at 02/04/20 1304   PRN Meds:.acetaminophen **OR** acetaminophen, ALPRAZolam, metoprolol tartrate, ondansetron **OR** ondansetron (ZOFRAN) IV, oxyCODONE   PHYSICAL EXAM: Vital signs: Vitals:   02/23/20 2124 02/24/20 0517 02/24/20 0946 02/24/20 1226  BP: 106/88 102/78 110/84 (!) 117/92  Pulse: 91 82  74  Resp: 18 18 18 19   Temp: 97.9 F (36.6 C) 97.9 F (36.6 C) 98.5 F (36.9 C) (!) 97.5 F (36.4 C)  TempSrc: Oral Oral Oral Oral  SpO2: 100% 98% 98% 100%  Weight:  87.2 kg    Height:       Filed Weights   02/22/20 0417 02/23/20 0425 02/24/20 0517  Weight: 87.5 kg 87.4 kg 87.2 kg   Body mass index is 28.4 kg/m.   Awake, alert and oriented good air entry, clear to auscultation Heart regular rate and rhythm Soft abdomen Left foot bandaged, please see pictures below obtained on 4/11.        I have personally reviewed following labs and imaging studies  LABORATORY  DATA: CBC: Recent Labs  Lab 02/19/20 0415  WBC 3.4*  HGB 9.6*  HCT 31.1*  MCV 83.4  PLT [id number]    Basic Metabolic Panel: Recent Labs  Lab 02/19/20 0415  NA 136  K 4.2  CL 102  CO2 25  GLUCOSE 112*  BUN 13  CREATININE 0.92  CALCIUM 9.2    GFR: Estimated Creatinine Clearance: 93.4 mL/min (by C-G formula based on SCr of 0.92 mg/dL).  Liver Function Tests: No results for input(s): AST, ALT, ALKPHOS, BILITOT, PROT, ALBUMIN in the last 168 hours. No results for input(s): LIPASE, AMYLASE in the last 168 hours. No results for input(s): AMMONIA in the last 168 hours.  Coagulation Profile: Recent Labs  Lab 02/18/20 0449 02/20/20 0404 02/22/20 0529  INR 2.3* 2.5* 2.4*    Cardiac Enzymes: No results for input(s): CKTOTAL, CKMB, CKMBINDEX, TROPONINI in the last 168 hours.  BNP (last 3 results) No results for input(s): PROBNP in the last 8760 hours.  Lipid Profile: No results for input(s): CHOL, HDL, LDLCALC, TRIG, CHOLHDL, LDLDIRECT in the last 72  hours.  Thyroid Function Tests: No results for input(s): TSH, T4TOTAL, FREET4, T3FREE, THYROIDAB in the last 72 hours.  Anemia Panel: No results for input(s): VITAMINB12, FOLATE, FERRITIN, TIBC, IRON, RETICCTPCT in the last 72 hours.  Urine analysis:    Component Value Date/Time   COLORURINE YELLOW 01/27/2020 1841   APPEARANCEUR CLEAR 01/27/2020 1841   APPEARANCEUR Clear 08/03/2019 1143   LABSPEC 1.015 01/27/2020 1841   PHURINE 6.0 01/27/2020 1841   GLUCOSEU NEGATIVE 01/27/2020 1841   HGBUR SMALL (A) 01/27/2020 1841   BILIRUBINUR NEGATIVE 01/27/2020 1841   BILIRUBINUR small (A) 08/03/2019 1144   BILIRUBINUR Negative 08/03/2019 1143   KETONESUR 5 (A) 01/27/2020 1841   PROTEINUR 30 (A) 01/27/2020 1841   UROBILINOGEN 1.0 08/03/2019 1144   UROBILINOGEN 1.0 01/25/2013 0825   NITRITE NEGATIVE 01/27/2020 1841   LEUKOCYTESUR NEGATIVE 01/27/2020 1841    Sepsis Labs: Lactic Acid, Venous    Component Value Date/Time    LATICACIDVEN 1.2 01/28/2020 0240    MICROBIOLOGY: No results found for this or any previous visit (from the past 240 hour(s)).  RADIOLOGY STUDIES/RESULTS: No results found.   LOS: 28 days   Oren Binet, MD  [facility]    To contact the attending provider between 7A-7P or the covering provider during after hours 7P-7A, please log into the web site [email or web address] and access using universal [place] password for that web site. If you do not have the password, please call the hospital operator.  02/24/2020, 1:45 PM

## 2020-02-24 NOTE — Plan of Care (Signed)
  Problem: Pain Managment: Goal: General experience of comfort will improve Outcome: Progressing   

## 2020-02-25 LAB — PROTIME-INR
INR: 2.3 — ABNORMAL HIGH (ref 0.8–1.2)
Prothrombin Time: 25.5 seconds — ABNORMAL HIGH (ref 11.4–15.2)

## 2020-02-25 MED ORDER — WARFARIN SODIUM 7.5 MG PO TABS: ORAL_TABLET | ORAL | 0 refills | Status: DC

## 2020-02-25 MED ORDER — OXYCODONE HCL 5 MG PO TABS: 5.0000 mg | ORAL_TABLET | Freq: Four times a day (QID) | ORAL | 0 refills | Status: DC | PRN

## 2020-02-25 MED ORDER — ACETAMINOPHEN 325 MG PO TABS: 650.0000 mg | ORAL_TABLET | Freq: Four times a day (QID) | ORAL | Status: DC | PRN

## 2020-02-25 MED ORDER — METOPROLOL SUCCINATE ER 50 MG PO TB24: 50.0000 mg | ORAL_TABLET | Freq: Every day | ORAL | 0 refills | Status: DC

## 2020-02-25 MED ORDER — DOCUSATE SODIUM 100 MG PO CAPS: 100.0000 mg | ORAL_CAPSULE | Freq: Two times a day (BID) | ORAL | 0 refills | Status: DC

## 2020-02-25 MED ORDER — GABAPENTIN 100 MG PO CAPS: 100.0000 mg | ORAL_CAPSULE | Freq: Three times a day (TID) | ORAL | 0 refills | Status: DC

## 2020-02-25 MED ORDER — NICOTINE 21 MG/24HR TD PT24: 21.0000 mg | MEDICATED_PATCH | Freq: Every day | TRANSDERMAL | 0 refills | Status: AC

## 2020-02-25 MED ORDER — ALPRAZOLAM 0.25 MG PO TABS: 0.2500 mg | ORAL_TABLET | Freq: Two times a day (BID) | ORAL | 0 refills | Status: DC | PRN

## 2020-02-25 MED FILL — NICOTINE 21 MG/24HR PATCH: 21 | 14 days supply | Qty: 14 | Fill #0

## 2020-02-25 MED FILL — WARFARIN SODIUM 7.5 MG TAB: 7.5 | 30 days supply | Qty: 30 | Fill #0

## 2020-02-25 MED FILL — METOPROLOL SUCCINATE ER 50: 50 | 30 days supply | Qty: 30 | Fill #0

## 2020-02-25 MED FILL — ALPRAZolam 0.25 MG TABS: 0.25 | 5 days supply | Qty: 10 | Fill #0

## 2020-02-25 MED FILL — oxyCODONE HCL 5 MG TABS: 5 | 5 days supply | Qty: 20 | Fill #0

## 2020-02-25 MED FILL — GABAPENTIN 100 MG CAPSULE: 100 | 30 days supply | Qty: 90 | Fill #0

## 2020-02-25 MED FILL — DOK 100 MG CAPS: 100 | 30 days supply | Qty: 60 | Fill #0

## 2020-02-25 NOTE — Discharge Summary (Signed)
PATIENT DETAILS Name: Leonard Bentley Age: 59 y.o. Sex: male Date of Birth: 23-Sep-1961 MRN: [id number]. Admitting Physician: Leonard Patience, MD [id number], Leonard Buff, NP  Admit Date: 01/27/2020 Discharge date: 02/25/2020  Recommendations for Outpatient Follow-up:  1. Follow up with PCP in 1-2 weeks 2. Please obtain CMP/CBC in one week 3. Please check INR in 1 week 4. Please ensure follow-up with Leonard Bentley  Admitted From:  Home  Disposition: [place]: No  Equipment/Devices: None  Discharge Condition: Stable  CODE STATUS: FULL CODE  Diet recommendation:  Diet Order            Diet - low sodium heart healthy        Diet Carb Modified Fluid consistency: Thin; Room service appropriate? Yes  Diet effective now              Brief Narrative: Patient is a 58 y.o. male with history of chronic diastolic heart failure, severe MR s/p mitral valve repair, recurrent PE on Coumadin, non-Hodgkin's lymphoma, alcohol abuse recent hospitalization from 3/5-3/12 osteomyelitis-requiring left first and second ray amputation who presented with wound dehiscence (noncompliant to weightbearing status), drainage and newly displaced fracture of the third metatarsal head with underlying osteomyelitis.  Patient was evaluated by orthopedics and underwent left third ray amputation.  Significant events: 3/5-3/12>> admission for left foot osteomyelitis-s/p left first and second ray amputation 3/21>> admit for left foot wound dehiscence-third metatarsal head displaced fracture with possible underlying osteomyelitis   Antimicrobial therapy: Ceftriaxone: 3/21>> 3/24 Vancomycin: 3/21>> 3/25  Microbiology data: 3/21>> blood culture: Negative  Procedures : 3/24>> left third ray amputation  Consults: [facility] Course: Left foot wound dehiscence with left third metatarsal fracture with possible underlying osteomyelitis-s/p left third ray amputation: Slowly  improving-evaluated by orthopedics on 4/16-sutures  removed.  Continue touch heel bearing-until evaluated by Leonard Bentley in the outpatient setting.  Hypomagnesemia:  repleted.  Anemia of chronic disease: no evidence of blood loss-I suspect this is secondary to inflammation related to chronic osteomyelitis/wound infection.  Follow CBC closely in the outpatient setting  Chronic diastolic heart failure: Compensated-follow volume status closely  History of severe MR-s/p mitral valve repair with annular ring prosthesis 2017: Stable-outpatient follow-up with cardiology  History of recurrent pulmonary embolism: Was on  overlapping heparin/Coumadin-INR now therapeutic-only on Coumadin.  Pharmacy managed INR/Coumadin during this hospital stay-please recheck INR in 1 week.  History of SVT/PACs: Stable-on beta-blocker.  History of tobacco use: Continue transdermal nicotine  History of non-Hodgkin's lymphoma in remission: Follow-up with oncology in the outpatient setting  Note: Initial recommendations from social work/PT was for SNF-recommendations are now for home health with 24-hour supervision.  Patient is attempting to go live with a family member in [place].  Case management attempted to set up home health in [place] this has not been possible due to lack of PCP in the area.  After with patient-since he feels better-he is requesting discharge on Monday without home health-he will go live with family members in [place] that will provide 24/7 supervision.    He has appointment with Leonard Bentley office and with the Coumadin clinic on 4/23.  Discharge Diagnoses:  Principal Problem:   Sepsis ([facility]) Active Problems:   Alcohol abuse   History of pulmonary embolus (PE)   S/P minimally-invasive mitral valve repair   Osteomyelitis (HCC)   Pressure injury of skin   Wound dehiscence, surgical, initial encounter   Discharge Instructions:  Activity:  Touch heel weightbearing  to left foot  Discharge Instructions    Call MD for:  redness, tenderness, or signs of infection (pain, swelling, redness, odor or green/yellow discharge around incision site)   Complete by: As directed    Diet - low sodium heart healthy   Complete by: As directed    Discharge instructions   Complete by: As directed    Follow with Primary MD  [name], NP in 1 week for INR check and post hospital discharge follow-up  Follow with Leonard Bentley in 1 week for wound check  Please get a complete blood count and chemistry panel checked by your Primary MD at your next visit, and again as instructed by your Primary MD.  Get Medicines reviewed and adjusted: Please take all your medications with you for your next visit with your Primary MD  Laboratory/radiological data: Please request your Primary MD to go over all hospital tests and procedure/radiological results at the follow up, please ask your Primary MD to get all Hospital records sent to his/her office.  In some cases, they will be blood work, cultures and biopsy results pending at the time of your discharge. Please request that your primary care M.D. follows up on these results.  Also Note the following: If you experience worsening of your admission symptoms, develop shortness of breath, life threatening emergency, suicidal or homicidal thoughts you must seek medical attention immediately by calling 911 or calling your MD immediately  if symptoms less severe.  You must read complete instructions/literature along with all the possible adverse reactions/side effects for all the Medicines you take and that have been prescribed to you. Take any new Medicines after you have completely understood and accpet all the possible adverse reactions/side effects.   Do not drive when taking Pain medications or sleeping medications (Benzodaizepines)  Do not take more than prescribed Pain, Sleep and Anxiety Medications. It is not advisable to combine  anxiety,sleep and pain medications without talking with your primary care practitioner  Special Instructions: If you have smoked or chewed Tobacco  in the last 2 yrs please stop smoking, stop any regular Alcohol  and or any Recreational drug use.  Wear Seat belts while driving.  Please note: You were cared for by a hospitalist during your hospital stay. Once you are discharged, your primary care physician will handle any further medical issues. Please note that NO REFILLS for any discharge medications will be authorized once you are discharged, as it is imperative that you return to your primary care physician (or establish a relationship with a primary care physician if you do not have one) for your post hospital discharge needs so that they can reassess your need for medications and monitor your lab values.   Increase activity slowly   Complete by: As directed      Allergies as of 02/25/2020      Reactions   Other Other (See Comments)   "Cactus" caused blisters on tongue (if prepared as food)      Medication List    STOP taking these medications   enoxaparin 150 MG/ML injection Commonly known as: LOVENOX     TAKE these medications   acetaminophen 325 MG tablet Commonly known as: TYLENOL Take 2 tablets (650 mg total) by mouth every 6 (six) hours as needed for mild pain (or Fever >/= 101).   ALPRAZolam 0.25 MG tablet Commonly known as: XANAX Take 1 tablet (0.25 mg total) by mouth 2 (two) times daily as needed for anxiety.  docusate sodium 100 MG capsule Commonly known as: COLACE Take 1 capsule (100 mg total) by mouth 2 (two) times daily.   gabapentin 100 MG capsule Commonly known as: NEURONTIN Take 1 capsule (100 mg total) by mouth 3 (three) times daily.   metoprolol succinate 50 MG 24 hr tablet Commonly known as: TOPROL-XL Take 1 tablet (50 mg total) by mouth daily. Take with or immediately following a meal. What changed: when to take this   nicotine 21 mg/24hr  patch Commonly known as: NICODERM CQ - dosed in mg/24 hours Place 1 patch (21 mg total) onto the skin daily.   oxyCODONE 5 MG immediate release tablet Commonly known as: Oxy IR/ROXICODONE Take 1 tablet (5 mg total) by mouth every 6 (six) hours as needed for moderate pain (pain score 4-6). What changed: reasons to take this   warfarin 7.5 MG tablet Commonly known as: COUMADIN Take as directed. If you are unsure how to take this medication, talk to your nurse or doctor. Original instructions: Take 1 tablet by mouth daily. Follow-up with Coumadin Clinic for INR and dosing instructions What changed: medication strength            Durable Medical Equipment  (From admission, onward)         Start     Ordered   02/25/20 0920  For home use only DME Walker rolling  Once    Question Answer Comment  Walker: With [place] Wheels   Patient needs a walker to treat with the following condition Physical deconditioning      02/25/20 0919         Follow-up Information    [name], MD In 1 week.   Specialty: Orthopedic Surgery Contact information: [place] [address] [phone]        [name], NP. Schedule an appointment as soon as possible for a visit in 1 week(s).   Specialty: Nurse Practitioner Contact information: [place] [address] [phone]        [name], MD. Schedule an appointment as soon as possible for a visit in 1 month(s).   Specialty: Cardiology Contact information: [address] [place] [address] [phone]        Persons, Bevely Palmer, [place] Follow up on 02/29/2020.   Specialty: Orthopedic Surgery Why: appt at 1 pm Contact information: [address] [place] [address] [phone]        [facility] Follow up on 02/29/2020.   Specialty: Internal Medicine Why: coumadin clinic -appt at 2:50 pm Contact information: [id number] [place] [address] [phone]         Allergies  Allergen Reactions  . Other Other (See Comments)    "Cactus" caused blisters on tongue (if prepared as food)    Other Procedures/Studies: DG Chest Portable 1 View  Result Date: 01/27/2020 CLINICAL DATA:  Shortness of breath. EXAM: PORTABLE CHEST 1 VIEW COMPARISON:  January 11, 2020 FINDINGS: The heart size and mediastinal contours are within normal limits. Both lungs are clear. The visualized skeletal structures are unremarkable. Again identified are innumerable bilateral pulmonary nodules, stable across prior studies. IMPRESSION: No active disease. Electronically Signed   By: Constance Holster M.D.   On: 01/27/2020 17:30   DG Foot Complete Left  Result Date: 01/27/2020 CLINICAL DATA:  Pain and bleeding from surgical site EXAM: LEFT FOOT - COMPLETE 3+ VIEW COMPARISON:  January 11, 2020 FINDINGS: The patient has undergone interval amputation through the distal first and second metatarsals. There is a displaced fracture of the third metatarsal head with a fracture fragment measuring approximately 1.5 cm. There is soft tissue swelling about the foot. IMPRESSION: 1. New displaced fracture through the third metatarsal head. This may represent a pathologic fracture secondary to underlying osteomyelitis. 2. Status post interval amputation of the first and second metatarsal heads. 3. There is nonspecific soft tissue swelling about the foot. Electronically Signed   By: Constance Holster M.D.   On: 01/27/2020 17:39   VAS [place] ABI WITH/WO TBI  Result Date: 01/28/2020 LOWER EXTREMITY DOPPLER STUDY Indications: Ulceration, and gangrene. High Risk Factors: Hypertension.  Vascular               01/16/2020 - LEFT 1ST METATARSAL AND SECOND TOE Interventions:         AMPUTATION. Comparison Study: No prior studies. Performing Technologist: Carlos Levering RVT  Examination Guidelines: A complete evaluation includes at minimum, Doppler  waveform signals and systolic blood pressure reading at the level of bilateral brachial, anterior tibial, and posterior tibial arteries, when vessel segments are accessible. Bilateral testing is considered an integral part of a complete examination. Photoelectric Plethysmograph (PPG) waveforms and toe systolic pressure readings are included as required and additional duplex testing as needed. Limited examinations for reoccurring indications may be performed as noted.  ABI Findings: +---------+------------------+-----+---------+--------+ Right    Rt Pressure (mmHg)IndexWaveform Comment  +---------+------------------+-----+---------+--------+ Brachial 154                    triphasic         +---------+------------------+-----+---------+--------+ PTA      163               1.06 biphasic          +---------+------------------+-----+---------+--------+ DP       161               1.05 biphasic          +---------+------------------+-----+---------+--------+ Great Toe68                0.44                   +---------+------------------+-----+---------+--------+ +---------+------------------+-----+---------+----------+ Left     Lt Pressure (mmHg)IndexWaveform Comment    +---------+------------------+-----+---------+----------+ Brachial 150                    triphasic           +---------+------------------+-----+---------+----------+ PTA      159               1.03 triphasic           +---------+------------------+-----+---------+----------+ DP       158               1.03 triphasic           +---------+------------------+-----+---------+----------+ Great Toe                                Amputation +---------+------------------+-----+---------+----------+ +-------+-----------+-----------+------------+------------+ ABI/TBIToday's ABIToday's TBIPrevious ABIPrevious TBI +-------+-----------+-----------+------------+------------+ Right  1.06       0.44                                 +-------+-----------+-----------+------------+------------+ Left   1.03                                           +-------+-----------+-----------+------------+------------+  Summary: Right: Resting right ankle-brachial index is within normal range. No evidence of significant right lower extremity arterial disease. The right toe-brachial index is abnormal. Left: Resting left ankle-brachial index is within normal range. No evidence of significant left lower extremity arterial disease. Unable to obtain TBI due to great toe amputation.  *See table(s) above for measurements and observations.  Electronically signed by Curt Jews MD on 01/28/2020 at 3:35:34 PM.    Final      TODAY-[place] OF DISCHARGE:  Subjective:   Leonard Bentley today has no headache,no chest abdominal pain,no new weakness tingling or numbness, feels much better wants to go home today.   Objective:   Blood pressure 114/75, pulse 76, temperature 98.3 F (36.8 C), temperature source Oral, resp. rate 18, height 5\' 9"  (1.753 m), weight 87.7 kg, SpO2 100 %.  Intake/Output Summary (Last 24 hours) at 02/25/2020 0938 Last data filed at 02/25/2020 0422 Gross per 24 hour  Intake 1200 ml  Output 800 ml  Net 400 ml   Filed Weights   02/23/20 0425 02/24/20 0517 02/25/20 0421  Weight: 87.4 kg 87.2 kg 87.7 kg    Exam: Awake Alert, Oriented *3, No new F.N deficits, Normal affect [place].AT,PERRAL Supple Neck,No JVD, No cervical lymphadenopathy appriciated.  Symmetrical Chest wall movement, Good air movement bilaterally, CTAB RRR,No Gallops,Rubs or new Murmurs, No Parasternal Heave +ve B.Sounds, Abd Soft, Non tender, No organomegaly appriciated, No rebound -guarding or rigidity. No Cyanosis, Clubbing or edema, No new Rash or bruise   PERTINENT RADIOLOGIC STUDIES: No results found.   PERTINENT LAB RESULTS: CBC: No results for input(s): WBC, HGB, HCT, PLT in the last 72 hours. CMET CMP     Component Value  Date/Time   NA 136 02/19/2020 0415   NA 138 08/03/2019 1121   NA 140 09/21/2017 0825   K 4.2 02/19/2020 0415   K 4.5 09/21/2017 0825   CL 102 02/19/2020 0415   CL 98 12/28/2012 0824   CO2 25 02/19/2020 0415   CO2 27 09/21/2017 0825   GLUCOSE 112 (H) 02/19/2020 0415   GLUCOSE 86 09/21/2017 0825   GLUCOSE 87 12/28/2012 0824   BUN 13 02/19/2020 0415   BUN 14 08/03/2019 1121   BUN 18.1 09/21/2017 0825   CREATININE 0.92 02/19/2020 0415   CREATININE 1.10 09/19/2019 1024   CREATININE 1.0 09/21/2017 0825   CALCIUM 9.2 02/19/2020 0415   CALCIUM 9.8 09/21/2017 0825   PROT 6.2 (L) 01/28/2020 0800   PROT 7.5 08/03/2019 1121   PROT 8.0 09/21/2017 0825   ALBUMIN 2.7 (L) 02/02/2020 0222   ALBUMIN 4.7 08/03/2019 1121   ALBUMIN 3.7 09/21/2017 0825   AST 43 (H) 01/28/2020 0800   AST 101 (H) 09/19/2019 1024   AST 58 (H) 09/21/2017 0825   ALT 32 01/28/2020 0800   ALT 84 (H) 09/19/2019 1024   ALT 37 09/21/2017 0825   ALKPHOS 50 01/28/2020 0800   ALKPHOS 63 09/21/2017 0825   BILITOT 1.1 01/28/2020 0800   BILITOT 1.1 09/19/2019 1024   BILITOT 0.62 09/21/2017 0825   GFRNONAA >60 02/19/2020 0415   GFRNONAA >60 09/19/2019 1024   GFRAA >60 02/19/2020 0415   GFRAA >60 09/19/2019 1024    GFR Estimated Creatinine Clearance: 93.6 mL/min (by C-G formula based on SCr of 0.92 mg/dL). No results for input(s): LIPASE, AMYLASE in the last 72 hours. No results for input(s): CKTOTAL, CKMB, CKMBINDEX, TROPONINI in the last 72 hours. Invalid input(s): POCBNP No results for input(s): DDIMER in the last  72 hours. No results for input(s): HGBA1C in the last 72 hours. No results for input(s): CHOL, HDL, LDLCALC, TRIG, CHOLHDL, LDLDIRECT in the last 72 hours. No results for input(s): TSH, T4TOTAL, T3FREE, THYROIDAB in the last 72 hours.  Invalid input(s): FREET3 No results for input(s): VITAMINB12, FOLATE, FERRITIN, TIBC, IRON, RETICCTPCT in the last 72 hours. Coags: Recent Labs    02/25/20 0429  INR  2.3*   Microbiology: No results found for this or any previous visit (from the past 240 hour(s)).  FURTHER DISCHARGE INSTRUCTIONS:  Get Medicines reviewed and adjusted: Please take all your medications with you for your next visit with your Primary MD  Laboratory/radiological data: Please request your Primary MD to go over all hospital tests and procedure/radiological results at the follow up, please ask your Primary MD to get all Hospital records sent to his/her office.  In some cases, they will be blood work, cultures and biopsy results pending at the time of your discharge. Please request that your primary care M.D. goes through all the records of your hospital data and follows up on these results.  Also Note the following: If you experience worsening of your admission symptoms, develop shortness of breath, life threatening emergency, suicidal or homicidal thoughts you must seek medical attention immediately by calling 911 or calling your MD immediately  if symptoms less severe.  You must read complete instructions/literature along with all the possible adverse reactions/side effects for all the Medicines you take and that have been prescribed to you. Take any new Medicines after you have completely understood and accpet all the possible adverse reactions/side effects.   Do not drive when taking Pain medications or sleeping medications (Benzodaizepines)  Do not take more than prescribed Pain, Sleep and Anxiety Medications. It is not advisable to combine anxiety,sleep and pain medications without talking with your primary care practitioner  Special Instructions: If you have smoked or chewed Tobacco  in the last 2 yrs please stop smoking, stop any regular Alcohol  and or any Recreational drug use.  Wear Seat belts while driving.  Please note: You were cared for by a hospitalist during your hospital stay. Once you are discharged, your primary care physician will handle any further  medical issues. Please note that NO REFILLS for any discharge medications will be authorized once you are discharged, as it is imperative that you return to your primary care physician (or establish a relationship with a primary care physician if you do not have one) for your post hospital discharge needs so that they can reassess your need for medications and monitor your lab values.  Total Time spent coordinating discharge including counseling, education and face to face time equals 35 minutes.  SignedOren Binet 02/25/2020 9:38 AM

## 2020-02-25 NOTE — Care Management (Signed)
1031 02-25-20 Case Manager reached out to patient. Patient recently received a rolling walker via [employer] on March 11 and will not be able to receive another rolling walker. Patient states he has his rolling walker at home- Case Manager asked if family or friend could bring the walker to the hospital and no one is available is to do so. Patient wanted to see if he could use the unit rolling walker and bring back. Case Manager reached out to Charge RN to ask that question. Charge RN will discuss with Therapist, sports. Patient states he is able to afford his cab home. He will call and arrange. Patient has medication orders for controlled substances- patient utilized [facility] in March- Case Manager unable to duplicate the use of [facility] to assist with medication assistance. Patient has a credit card to afford medication cost for today. We discussed his upcoming appointment at the [facility] that he needs to make sure he is present for. Clinic has an onsite pharmacy for assistance thereafter for assistance. No further needs from Case Manager at this time. Bethena Roys, RN,BSN Case Manager

## 2020-02-26 ENCOUNTER — Encounter: Payer: Self-pay | Admitting: Orthopedic Surgery

## 2020-02-26 ENCOUNTER — Ambulatory Visit (INDEPENDENT_AMBULATORY_CARE_PROVIDER_SITE_OTHER): Payer: Self-pay | Admitting: Orthopedic Surgery

## 2020-02-26 ENCOUNTER — Telehealth: Payer: Self-pay

## 2020-02-26 ENCOUNTER — Other Ambulatory Visit: Payer: Self-pay

## 2020-02-26 VITALS — Wt 193.0 lb

## 2020-02-26 DIAGNOSIS — Z89412 Acquired absence of left great toe: Secondary | ICD-10-CM

## 2020-02-26 NOTE — Progress Notes (Signed)
Post-Op Visit Note   Patient: Leonard Bentley           Date of Birth: Jul 28, 1961           MRN: [id number] Visit Date: 02/26/2020 PCP: [name], NP  Chief Complaint:  Chief Complaint  Patient presents with  . Left Foot - Routine Post Op    01/30/20 left foot 3rd ray amputation     HPI:  HPI The patient woman who is seen today status post left third ray amputation on March 24 he is status post prior toe amputations of the same foot.  He has been minimizing his weightbearing using a walker.  He has been doing daily dry dressing changes.  Ortho Exam On examination of the left foot he does have some 2+ pitting edema this is equal bilaterally.  There is no erythema or warmth associated with this he does have 1 area that is not yet healed this is 15 mm x 2 mm filled in with granulation tissue there is no active drainage no odor no sign of infection  Visit Diagnoses:  1. Status post amputation of left great toe (HCC)     Plan: Discussed wearing compression garments to bilateral lower extremities the importance of elevation.  The patient will continue with current wound care and follow-up in the office in 3 weeks  Follow-Up Instructions: Return in about 3 weeks (around 03/18/2020).   Imaging: No results found.  Orders:  No orders of the defined types were placed in this encounter.  No orders of the defined types were placed in this encounter.    PMFS History: Patient Active Problem List   Diagnosis Date Noted  . Wound dehiscence, surgical, initial encounter   . Pressure injury of skin 01/28/2020  . Osteomyelitis ([place]) 01/27/2020  . Sepsis ([place]) 01/27/2020  . Cellulitis of left foot   . Left foot infection 01/11/2020  . Chronic combined systolic and diastolic heart failure ([place]) 03/06/2019  . Coagulation disorder ([place]) 03/06/2019  . Impacted cerumen of both ears 02/09/2019  . Osteomyelitis of great toe of left foot ([place]) 01/09/2019  . Foot ulcer, left ([place]) 12/27/2018   . Cellulitis 12/07/2018  . Bleeding from left ear 11/28/2018  . HTN (hypertension) 11/02/2018  . UTI (urinary tract infection) 11/02/2018  . Ecchymosis   . Tachyarrhythmia 08/30/2018  . Hypomagnesemia 08/30/2018  . Recurrent pulmonary emboli ([place])   . Hematuria   . Delirium tremens ([place])   . LFTs abnormal   . Alcoholic hepatitis without ascites   . Alcohol withdrawal ([place]) 06/04/2018  . Dizziness   . Posterior tibial tendon dysfunction 02/21/2018  . Supratherapeutic INR 11/27/2016  . Dehydration 11/27/2016  . SVT (supraventricular tachycardia) ([place])   . S/P minimally-invasive mitral valve repair 10/27/2016  . History of pulmonary embolus (PE) 04/11/2016  . Left sided numbness   . Anxiety 04/02/2016  . Panic attacks 04/02/2016  . Mitral valve prolapse   . Mitral regurgitation 01/20/2016  . Atrial tachycardia ([place]) 01/06/2016  . Murmur 01/06/2016  . Mobitz I 10/09/2015  . PAC (premature atrial contraction) 10/09/2015  . Arthritis 08/14/2015  . Neuropathy due to chemotherapeutic drug ([place]) 03/09/2013  . Leucopenia 01/31/2013  . Personal history of pulmonary embolism 12/28/2012  . Alcohol abuse 08/24/2012  . Cigarette smoker 08/24/2012  . Hemorrhoid 03/08/2012  . Anemia 12/10/2011  . History of non-Hodgkin's lymphoma 09/25/2011   Past Medical History:  Diagnosis Date  . Acute pulmonary embolism ([place]) 04/11/2016  .  Anxiety   . Arthritis   . Atrial tachycardia ([place]) 01/06/2016  . Depression   . DTs (delirium tremens) ([place])   . Dyspnea   . ED (erectile dysfunction)   . ETOH abuse   . Hypertension   . Lymphoma, non Hodgkin's 09/25/2011   Stage 2  . Mitral regurgitation 01/20/2016  . Mobitz I 10/09/2015  . Murmur 01/06/2016  . Occasional tremors   . PAC (premature atrial contraction) 10/09/2015  . S/P minimally-invasive mitral valve repair 10/27/2016   Complex valvuloplasty including artificial Gore-tex neochord placement x6 and 34 mm Edwards Physio II ring annuloplasty  via right mini thoracotomy approach    Family History  Problem Relation Age of Onset  . Cancer Mother        BREAST(BONE)  . Cancer Father        PANCREATIC  . Hypertension Maternal Grandmother   . Stroke Maternal Aunt   . Heart attack Neg Hx     Past Surgical History:  Procedure Laterality Date  . AMPUTATION Left 01/12/2019   Procedure: LEFT GREAT TOE AMPUTATION;  Surgeon: [name], MD;  Location: [place];  Service: Orthopedics;  Laterality: Left;  . AMPUTATION Left 01/16/2020   Procedure: LEFT 1ST METATARSAL AND SECOND TOE AMPUTATION;  Surgeon: [name], MD;  Location: [facility];  Service: Orthopedics;  Laterality: Left;  . AMPUTATION Left 01/30/2020   Procedure: LEFT FOOT 3RD RAY AMPUTATION;  Surgeon: [name], MD;  Location: [facility];  Service: Orthopedics;  Laterality: Left;  . CARDIAC CATHETERIZATION N/A 08/13/2016   Procedure: Right/Left Heart Cath and Coronary Angiography;  Surgeon: [name], MD;  Location: [facility];  Service: Cardiovascular;  Laterality: N/A;  . MITRAL VALVE REPAIR Right 10/27/2016   Procedure: MINIMALLY INVASIVE MITRAL VALVE REPAIR (MVR) USING 34 PHYSIO II ANNULOPLASTY RING;  Surgeon: [name], MD;  Location: [facility];  Service: Open Heart Surgery;  Laterality: Right;  . SKIN BIOPSY Right 04/04/2018   right mid medial anterior tibial shave  see report in chart  . TEE WITHOUT CARDIOVERSION N/A 02/11/2016   Procedure: TRANSESOPHAGEAL ECHOCARDIOGRAM (TEE);  Surgeon: [name], MD;  Location: [place];  Service: Cardiovascular;  Laterality: N/A;  . TEE WITHOUT CARDIOVERSION N/A 10/27/2016   Procedure: TRANSESOPHAGEAL ECHOCARDIOGRAM (TEE);  Surgeon: [name], MD;  Location: [facility];  Service: Open Heart Surgery;  Laterality: N/A;  . TRIGGER FINGER RELEASE Bilateral    Social History   Occupational History  . Occupation: unemployed  Tobacco Use  . Smoking status: Current Every [place] Smoker    Packs/[place]: 1.00     Years: 40.00    Pack years: 40.00    Types: Cigarettes, E-cigarettes  . Smokeless tobacco: Former Systems developer    Quit date: 1985  . Tobacco comment: vapes all [place]  Substance and Sexual Activity  . Alcohol use: Yes    Alcohol/week: 0.0 standard drinks    Comment: 1-2 bottles of red wine a [place]  . Drug use: No  . Sexual activity: Not Currently

## 2020-02-26 NOTE — Telephone Encounter (Signed)
Transition Care Management Follow-up Telephone Call  Date of discharge and from where: 02/23/2020, [facility]  How have you been since you were released from the hospital? He said that he is " doing fine" noting that his foot is much better. He was just seen by ortho this afternoon.  There are 2 small open areas at his surgical site that are almost healed.  He explained that he will continue to do his wound care  - cleanse the area with saline, apply non stick gauze and then ace wrap.  He needs to get more supplies and noted that [facility] did not have any.  He may request supplies from [facility] when he comes for INR check on 02/29/2020  Any questions or concerns? none reported at this time.  He was not home at the time of this call.   Items Reviewed:  Did the pt receive and understand the discharge instructions provided?  he has the instructions but did not have them with him.    Medications obtained and verified? he said he has medications. No questions at this time as he was not home.  Instructed him to call the office with any questions.   Any new allergies since your discharge?  None reported   Other (ie: DME, Home Health, etc) no home health ordered.   Functional Questionnaire: (I = Independent and D = Dependent) ADL's: independent, has walker    Follow up appointments reviewed:    PCP Hospital f/u appt confirmed?he said that he needs to call back to schedule an appointment with PCP  [facility] f/u appt confirmed? .saw ortho today. Has INR check scheduled at [facility] on 02/29/2020   Are transportation arrangements needed?  he may need assistance with transportation. His brother was driving him today.  If their condition worsens, is the pt aware to call  their PCP or go to the ED?  yes  Was the patient provided with contact information for the PCP's office or ED? he has the clinic phone number  Was the pt encouraged to call back with questions or concerns? yes

## 2020-02-28 ENCOUNTER — Encounter: Payer: Self-pay | Admitting: Family

## 2020-02-28 ENCOUNTER — Ambulatory Visit: Payer: Self-pay | Attending: Nurse Practitioner | Admitting: Pharmacist

## 2020-02-28 ENCOUNTER — Other Ambulatory Visit: Payer: Self-pay

## 2020-02-28 ENCOUNTER — Ambulatory Visit (HOSPITAL_BASED_OUTPATIENT_CLINIC_OR_DEPARTMENT_OTHER): Payer: Self-pay | Admitting: Family

## 2020-02-28 VITALS — BP 125/82 | HR 85 | Resp 16

## 2020-02-28 DIAGNOSIS — Z9889 Other specified postprocedural states: Secondary | ICD-10-CM

## 2020-02-28 DIAGNOSIS — Z86711 Personal history of pulmonary embolism: Secondary | ICD-10-CM

## 2020-02-28 DIAGNOSIS — H6123 Impacted cerumen, bilateral: Secondary | ICD-10-CM

## 2020-02-28 LAB — POCT INR: INR: 2.8 (ref 2.0–3.0)

## 2020-02-28 NOTE — Progress Notes (Signed)
Patient ID: AMAJE IGOU, male    DOB: Jan 09, 1961  MRN: [id number]  CC: Left ear wax buildup  Subjective: Leyland Miskell is a 59 y.o. male with history of hypertension and anemia who presents for left ear wax buildup.  1. LEFT EARWAX BUILDUP:   Ear pain: left ear 4/10 during the night ear pain is worse. When he awakens in the morning it goes away.  Onset: 2 months ago and does have history of this issue in the past for at least 2 years  Symptoms  Sensation of fullness: muffled hearing in left ear and when he touches left ear feels pressure Ear discharge: denies  URI symptoms: denies Fever: denies Tinnitus: denies   Dizziness: denies Headaches: denies Hearing loss: denies Popping/clicking sounds: denies  Sensation of foreign body in ear: left ear feels blocked like an air bubble Toothache: denies  Rashes or lesions: denies  Facial muscle weakness: denies  Comments: Does not use Q-tips for cleaning. Uses washrag to clean ears.   Red Flags Recent trauma: denies PMH prior ear surgery: denies   Diabetes or Immunosuppresion: denies  Patient Active Problem List   Diagnosis Date Noted  . Wound dehiscence, surgical, initial encounter   . Pressure injury of skin 01/28/2020  . Osteomyelitis ([place]) 01/27/2020  . Sepsis ([place]) 01/27/2020  . Cellulitis of left foot   . Left foot infection 01/11/2020  . Chronic combined systolic and diastolic heart failure ([place]) 03/06/2019  . Coagulation disorder ([place]) 03/06/2019  . Impacted cerumen of both ears 02/09/2019  . Osteomyelitis of great toe of left foot ([place]) 01/09/2019  . Foot ulcer, left ([place]) 12/27/2018  . Cellulitis 12/07/2018  . Bleeding from left ear 11/28/2018  . HTN (hypertension) 11/02/2018  . UTI (urinary tract infection) 11/02/2018  . Ecchymosis   . Tachyarrhythmia 08/30/2018  . Hypomagnesemia 08/30/2018  . Recurrent pulmonary emboli ([place])   . Hematuria   . Delirium tremens ([place])   . LFTs abnormal   . Alcoholic hepatitis  without ascites   . Alcohol withdrawal ([place]) 06/04/2018  . Dizziness   . Posterior tibial tendon dysfunction 02/21/2018  . Supratherapeutic INR 11/27/2016  . Dehydration 11/27/2016  . SVT (supraventricular tachycardia) ([place])   . S/P minimally-invasive mitral valve repair 10/27/2016  . History of pulmonary embolus (PE) 04/11/2016  . Left sided numbness   . Anxiety 04/02/2016  . Panic attacks 04/02/2016  . Mitral valve prolapse   . Mitral regurgitation 01/20/2016  . Atrial tachycardia ([place]) 01/06/2016  . Murmur 01/06/2016  . Mobitz I 10/09/2015  . PAC (premature atrial contraction) 10/09/2015  . Arthritis 08/14/2015  . Neuropathy due to chemotherapeutic drug ([place]) 03/09/2013  . Leucopenia 01/31/2013  . Personal history of pulmonary embolism 12/28/2012  . Alcohol abuse 08/24/2012  . Cigarette smoker 08/24/2012  . Hemorrhoid 03/08/2012  . Anemia 12/10/2011  . History of non-Hodgkin's lymphoma 09/25/2011     Current Outpatient Medications on File Prior to Visit  Medication Sig Dispense Refill  . acetaminophen (TYLENOL) 325 MG tablet Take 2 tablets (650 mg total) by mouth every 6 (six) hours as needed for mild pain (or Fever >/= 101).    Marland Kitchen ALPRAZolam (XANAX) 0.25 MG tablet Take 1 tablet (0.25 mg total) by mouth 2 (two) times daily as needed for anxiety. 10 tablet 0  . docusate sodium (COLACE) 100 MG capsule Take 1 capsule (100 mg total) by mouth 2 (two) times daily. 60 capsule 0  . gabapentin (NEURONTIN) 100 MG capsule Take 1  capsule (100 mg total) by mouth 3 (three) times daily. 90 capsule 0  . metoprolol succinate (TOPROL-XL) 50 MG 24 hr tablet Take 1 tablet (50 mg total) by mouth daily. Take with or immediately following a meal. 30 tablet 0  . nicotine (NICODERM CQ - DOSED IN MG/24 HOURS) 21 mg/24hr patch Place 1 patch (21 mg total) onto the skin daily. 28 patch 0  . oxyCODONE (OXY IR/ROXICODONE) 5 MG immediate release tablet Take 1 tablet (5 mg total) by mouth every 6 (six) hours  as needed for moderate pain (pain score 4-6). 20 tablet 0  . warfarin (COUMADIN) 7.5 MG tablet Take 1 tablet by mouth daily. Follow-up with Coumadin Clinic for INR and dosing instructions 60 tablet 0   No current facility-administered medications on file prior to visit.    Allergies  Allergen Reactions  . Other Other (See Comments)    "Cactus" caused blisters on tongue (if prepared as food)    Social History   Socioeconomic History  . Marital status: Single    Spouse name: Not on file  . Number of children: Not on file  . Years of education: Not on file  . Highest education level: Not on file  Occupational History  . Occupation: unemployed  Tobacco Use  . Smoking status: Current Every [place] Smoker    Packs/[place]: 1.00    Years: 40.00    Pack years: 40.00    Types: Cigarettes, E-cigarettes  . Smokeless tobacco: Former Systems developer    Quit date: 1985  . Tobacco comment: vapes all [place]  Substance and Sexual Activity  . Alcohol use: Yes    Alcohol/week: 0.0 standard drinks    Comment: 1-2 bottles of red wine a [employer]  . Drug use: No  . Sexual activity: Not Currently  Other Topics Concern  . Not on file  Social History Narrative   Epworth sleepiness scale as of 10/09/15 a 1   Social Determinants of Health   Financial Resource Strain:   . Difficulty of Paying Living Expenses:   Food Insecurity:   . Worried About Charity fundraiser in the Last Year:   . Arboriculturist in the Last Year:   Transportation Needs:   . Film/video editor (Medical):   Marland Kitchen Lack of Transportation (Non-Medical):   Physical Activity:   . Days of Exercise per Week:   . Minutes of Exercise per Session:   Stress:   . Feeling of Stress :   Social Connections:   . Frequency of Communication with Friends and Family:   . Frequency of Social Gatherings with Friends and Family:   . Attends Religious Services:   . Active Member of Clubs or Organizations:   . Attends Archivist Meetings:   Marland Kitchen Marital  Status:   Intimate Partner Violence:   . Fear of Current or Ex-Partner:   . Emotionally Abused:   Marland Kitchen Physically Abused:   . Sexually Abused:     Family History  Problem Relation Age of Onset  . Cancer Mother        BREAST(BONE)  . Cancer Father        PANCREATIC  . Hypertension Maternal Grandmother   . Stroke Maternal Aunt   . Heart attack Neg Hx     Past Surgical History:  Procedure Laterality Date  . AMPUTATION Left 01/12/2019   Procedure: LEFT GREAT TOE AMPUTATION;  Surgeon: [name], MD;  Location: [place];  Service: Orthopedics;  Laterality: Left;  .  AMPUTATION Left 01/16/2020   Procedure: LEFT 1ST METATARSAL AND SECOND TOE AMPUTATION;  Surgeon: [name], MD;  Location: [place];  Service: Orthopedics;  Laterality: Left;  . AMPUTATION Left 01/30/2020   Procedure: LEFT FOOT 3RD RAY AMPUTATION;  Surgeon: [name], MD;  Location: [place];  Service: Orthopedics;  Laterality: Left;  . CARDIAC CATHETERIZATION N/A 08/13/2016   Procedure: Right/Left Heart Cath and Coronary Angiography;  Surgeon: [name], MD;  Location: [facility];  Service: Cardiovascular;  Laterality: N/A;  . MITRAL VALVE REPAIR Right 10/27/2016   Procedure: MINIMALLY INVASIVE MITRAL VALVE REPAIR (MVR) USING 34 PHYSIO II ANNULOPLASTY RING;  Surgeon: [name], MD;  Location: [place];  Service: Open Heart Surgery;  Laterality: Right;  . SKIN BIOPSY Right 04/04/2018   right mid medial anterior tibial shave  see report in chart  . TEE WITHOUT CARDIOVERSION N/A 02/11/2016   Procedure: TRANSESOPHAGEAL ECHOCARDIOGRAM (TEE);  Surgeon: [name], MD;  Location: [place];  Service: Cardiovascular;  Laterality: N/A;  . TEE WITHOUT CARDIOVERSION N/A 10/27/2016   Procedure: TRANSESOPHAGEAL ECHOCARDIOGRAM (TEE);  Surgeon: [name], MD;  Location: [place];  Service: Open Heart Surgery;  Laterality: N/A;  . TRIGGER FINGER RELEASE Bilateral     ROS: Review of Systems Negative except  as stated above  PHYSICAL EXAM: Vitals with BMI 02/28/2020 02/26/2020 02/25/2020  Height - - -  Weight - 193 lbs -  BMI - [id number] -  Systolic 0000000 - 99991111  Diastolic 82 - 75  Pulse 85 - 76  SpO2- 99%, room air  Physical Exam General appearance - alert, well appearing, and in no distress and oriented to person, place, and time Mental status - alert, oriented to person, place, and time, normal mood, behavior, speech, dress, motor activity, and thought processes Eyes - pupils equal and reactive, extraocular eye movements intact Ears - bilateral TM's and external ear canals normal, ceruminosis noted, cerumen removed, hearing grossly normal bilaterally Nose - normal and patent, no erythema, discharge or polyps and normal nontender sinuses Mouth - mucous membranes moist, pharynx normal without lesions Neck - supple, no significant adenopathy Lymphatics - no palpable lymphadenopathy, no hepatosplenomegaly Chest - clear to auscultation, no wheezes, rales or rhonchi, symmetric air entry, no tachypnea, retractions or cyanosis Heart - normal rate, regular rhythm, normal S1, S2, no murmurs, rubs, clicks or gallops  ASSESSMENT AND PLAN: 1. Excessive cerumen in both ear canals: -Bilateral ear lavage related to excessive cerumen. Patient reported some relief after lavage.  -Follow-up with primary provider as needed. - Ear Lavage  Patient was given the opportunity to ask questions.  Patient verbalized understanding of the plan and was able to repeat key elements of the plan. Patient was given clear instructions to go to Emergency Department or return to medical center if symptoms don't improve, worsen, or new problems develop.The patient verbalized understanding.   Requested Prescriptions    No prescriptions requested or ordered in this encounter    Andreika Vandagriff Zachery Dauer, NP

## 2020-02-28 NOTE — Progress Notes (Signed)
Patient came in today for coumadin clinic. CPP asked if RN could reapply dressing on patient's left foot. Patient stated he wrapped it too tight today.  The ace wrap was reapplied to his left foot.  He also stated he had pain in his left ear and asked if RN could look in his ear.  Left ear cerumen impaction noted.  Patient was placed on the schedule with Durene Fruits, NP for further evaluation.

## 2020-02-28 NOTE — Patient Instructions (Signed)
Follow-up with primary physician as needed.  Earwax Buildup, Adult The ears produce a substance called earwax that helps keep bacteria out of the ear and protects the skin in the ear canal. Occasionally, earwax can build up in the ear and cause discomfort or hearing loss. What increases the risk? This condition is more likely to develop in people who:  Are male.  Are elderly.  Naturally produce more earwax.  Clean their ears often with cotton swabs.  Use earplugs often.  Use in-ear headphones often.  Wear hearing aids.  Have narrow ear canals.  Have earwax that is overly thick or sticky.  Have eczema.  Are dehydrated.  Have excess hair in the ear canal. What are the signs or symptoms? Symptoms of this condition include:  Reduced or muffled hearing.  A feeling of fullness in the ear or feeling that the ear is plugged.  Fluid coming from the ear.  Ear pain.  Ear itch.  Ringing in the ear.  Coughing.  An obvious piece of earwax that can be seen inside the ear canal. How is this diagnosed? This condition may be diagnosed based on:  Your symptoms.  Your medical history.  An ear exam. During the exam, your health care provider will look into your ear with an instrument called an otoscope. You may have tests, including a hearing test. How is this treated? This condition may be treated by:  Using ear drops to soften the earwax.  Having the earwax removed by a health care provider. The health care provider may: ? Flush the ear with water. ? Use an instrument that has a loop on the end (curette). ? Use a suction device.  Surgery to remove the wax buildup. This may be done in severe cases. Follow these instructions at home:   Take over-the-counter and prescription medicines only as told by your health care provider.  Do not put any objects, including cotton swabs, into your ear. You can clean the opening of your ear canal with a washcloth or facial  tissue.  Follow instructions from your health care provider about cleaning your ears. Do not over-clean your ears.  Drink enough fluid to keep your urine clear or pale yellow. This will help to thin the earwax.  Keep all follow-up visits as told by your health care provider. If earwax builds up in your ears often or if you use hearing aids, consider seeing your health care provider for routine, preventive ear cleanings. Ask your health care provider how often you should schedule your cleanings.  If you have hearing aids, clean them according to instructions from the manufacturer and your health care provider. Contact a health care provider if:  You have ear pain.  You develop a fever.  You have blood, pus, or other fluid coming from your ear.  You have hearing loss.  You have ringing in your ears that does not go away.  Your symptoms do not improve with treatment.  You feel like the room is spinning (vertigo). Summary  Earwax can build up in the ear and cause discomfort or hearing loss.  The most common symptoms of this condition include reduced or muffled hearing and a feeling of fullness in the ear or feeling that the ear is plugged.  This condition may be diagnosed based on your symptoms, your medical history, and an ear exam.  This condition may be treated by using ear drops to soften the earwax or by having the earwax removed by a health  care provider.  Do not put any objects, including cotton swabs, into your ear. You can clean the opening of your ear canal with a washcloth or facial tissue. This information is not intended to replace advice given to you by your health care provider. Make sure you discuss any questions you have with your health care provider. Document Revised: 10/07/2017 Document Reviewed: 01/05/2017 Elsevier Patient Education  2020 [employer].

## 2020-02-29 ENCOUNTER — Ambulatory Visit: Payer: Self-pay | Admitting: Pharmacist

## 2020-02-29 ENCOUNTER — Telehealth: Payer: Self-pay

## 2020-02-29 ENCOUNTER — Telehealth: Payer: Self-pay | Admitting: Orthopedic Surgery

## 2020-02-29 ENCOUNTER — Inpatient Hospital Stay: Payer: Self-pay | Admitting: Physician Assistant

## 2020-02-29 ENCOUNTER — Inpatient Hospital Stay: Payer: Self-pay | Admitting: Family

## 2020-02-29 NOTE — Telephone Encounter (Signed)
I contact the patient by phone.  I discussed with him that if he is having bilateral lower extremity swelling going up and down his legs he probably is best speaking with his primary care provider who is familiar with his regular medications who may be able to prescribe something such as Lasix.  I also have offered him the option of coming in at his next appointment and being measured for compression stockings.  He appreciated the call back and did not have any questions

## 2020-02-29 NOTE — Telephone Encounter (Signed)
Will route to PCP for the request for Lasix.

## 2020-02-29 NOTE — Telephone Encounter (Signed)
Please advise 

## 2020-02-29 NOTE — Telephone Encounter (Signed)
Needs office visit. I haven't prescribed him lasix in the past.

## 2020-02-29 NOTE — Telephone Encounter (Signed)
Pt called in wanting to know if there was anything we could prescribe to help with the swelling in his feet and legs? If so the pt would like it sent to the [facility] on [address] and would like a call when its been sent in.  [phone]

## 2020-02-29 NOTE — Telephone Encounter (Signed)
Patient called to make a medication request for Lasix. Patient states that both of his legs are swelling.  Patient uses [facility] on [address].   Please advice [phone]

## 2020-03-03 ENCOUNTER — Ambulatory Visit: Payer: Self-pay | Attending: Nurse Practitioner | Admitting: *Deleted

## 2020-03-03 ENCOUNTER — Other Ambulatory Visit: Payer: Self-pay

## 2020-03-03 DIAGNOSIS — H6123 Impacted cerumen, bilateral: Secondary | ICD-10-CM

## 2020-03-03 NOTE — Telephone Encounter (Signed)
Scheduled pt. An OV with PCP. Pt. Is aware of time and date.

## 2020-03-03 NOTE — Progress Notes (Signed)
Patient came into office to finish his ears irrigated. Left ear had a lot still left in his ears and it was cleaned out. His Right ears needed less cleaning but it was cleaned out.   Amy Minette Brine checked ears and it was

## 2020-03-04 ENCOUNTER — Telehealth: Payer: Self-pay | Admitting: Nurse Practitioner

## 2020-03-04 NOTE — Telephone Encounter (Signed)
Patient called asking if it is fine for him to take OTC diurex for fluid retention. Please f/u

## 2020-03-05 ENCOUNTER — Ambulatory Visit: Payer: Self-pay | Admitting: Nurse Practitioner

## 2020-03-06 ENCOUNTER — Telehealth: Payer: Self-pay | Admitting: Nurse Practitioner

## 2020-03-06 NOTE — Telephone Encounter (Signed)
Attempt to reach patient to inform on PCP advising. No answer and LVM for call back.

## 2020-03-06 NOTE — Telephone Encounter (Signed)
No it is not okay to take OTC diuretics. He needs to avoid foods high in sodium: processed meats or deli meats, pork, eating taking out, foods high in sodium including cheese. Drink at least 80 oz of water per [place]. Wear compression socks daily.

## 2020-03-06 NOTE — Telephone Encounter (Signed)
I did not prescribe these medications and I do not prescribe these medications for my patients.

## 2020-03-06 NOTE — Telephone Encounter (Signed)
Patient called in and requested for listed medications to be sent to [facility].  ALPRAZolam (XANAX) 0.25 MG tablet [id number] oxyCODONE (OXY IR/ROXICODONE) 5 MG immediate release tablet [id number]

## 2020-03-07 ENCOUNTER — Telehealth: Payer: Self-pay | Admitting: Orthopedic Surgery

## 2020-03-07 ENCOUNTER — Other Ambulatory Visit: Payer: Self-pay | Admitting: Orthopedic Surgery

## 2020-03-07 MED ORDER — ALPRAZOLAM 0.25 MG PO TABS: 0.2500 mg | ORAL_TABLET | Freq: Two times a day (BID) | ORAL | 0 refills | Status: DC | PRN

## 2020-03-07 MED ORDER — OXYCODONE HCL 5 MG PO TABS: 5.0000 mg | ORAL_TABLET | Freq: Four times a day (QID) | ORAL | 0 refills | Status: DC | PRN

## 2020-03-07 NOTE — Telephone Encounter (Signed)
Patient was called, identified by 2 patient identifiers. Patient was given note from pcp. Patient verbalized understanding and had no further questions or concerns.

## 2020-03-07 NOTE — Telephone Encounter (Signed)
Pt called requesting a refill of his oxycodone medication and alprazolam; pt also stated he would like it called into the [facility] on [address]. Pt would like a call back when this has been called in.   [phone]

## 2020-03-07 NOTE — Telephone Encounter (Signed)
I called pt and advised that both rx have been called into the pharm.

## 2020-03-07 NOTE — Telephone Encounter (Signed)
I sent rx

## 2020-03-07 NOTE — Telephone Encounter (Signed)
01/30/20 left foot 3rd ray amputation please see message below requesting refill on Oxycodone and alprazolam

## 2020-03-10 ENCOUNTER — Other Ambulatory Visit: Payer: Self-pay

## 2020-03-10 ENCOUNTER — Ambulatory Visit (INDEPENDENT_AMBULATORY_CARE_PROVIDER_SITE_OTHER): Payer: Self-pay

## 2020-03-10 ENCOUNTER — Ambulatory Visit (INDEPENDENT_AMBULATORY_CARE_PROVIDER_SITE_OTHER): Payer: Self-pay | Admitting: Orthopedic Surgery

## 2020-03-10 ENCOUNTER — Encounter: Payer: Self-pay | Admitting: Orthopedic Surgery

## 2020-03-10 VITALS — Ht 69.0 in | Wt 193.0 lb

## 2020-03-10 DIAGNOSIS — Z89412 Acquired absence of left great toe: Secondary | ICD-10-CM

## 2020-03-10 DIAGNOSIS — I872 Venous insufficiency (chronic) (peripheral): Secondary | ICD-10-CM

## 2020-03-10 DIAGNOSIS — M79642 Pain in left hand: Secondary | ICD-10-CM

## 2020-03-10 NOTE — Progress Notes (Signed)
Office Visit Note   Patient: Leonard Bentley           Date of Birth: 1961/08/24           MRN: [id number] Visit Date: 03/10/2020              Requested by: [name], NP [address] [place],  [place] [address] PCP: [name], NP  Chief Complaint  Patient presents with  . Left Foot - Routine Post Op    01/30/20 left foot 3rd ray amputation       HPI: Patient is a 59 year old gentleman who presents in follow-up complaining of swelling of the left foot left lower extremity as well as swelling of the right lower extremity.  Patient is status post first second and third ray amputations on the left foot.  Patient states he does not elevate his legs during the [place] and does not wear compression stockings.  Assessment & Plan: Visit Diagnoses:  1. Pain in left hand   2. Status post amputation of left great toe (HCC)   3. Venous insufficiency (chronic) (peripheral)     Plan: Discussed that patient could use Voltaren gel which is over-the-counter now.  Recommended knee-high compression stockings to be worn daily he was given a prescription to go to [facility] to obtain pain extra-large stockings.  Discussed that the mass on the left long finger is soft and mobile with no skin color changes no ulcers this appears benign.  Recommend that we will reevaluate if there is any change in the soft tissue mass.  Follow-Up Instructions: No follow-ups on file.   Ortho Exam  Patient is alert, oriented, no adenopathy, well-dressed, normal affect, normal respiratory effort. Examination patient has significant pitting edema both lower extremities up to the tibial tubercle bilaterally.  He has significant swelling in the left foot there is no cellulitis no drainage no odor no signs of infection there is no tenderness to palpation the left calf measures 41 cm in circumference.  Patient also has swelling and pitting edema in the right lower extremity as well no ulcers in either  leg.  Semination of the left hand long finger has no swelling no skin color changes no ulcers.  He has full range of motion his finger over the middle phalanx there appears to be a vein which is swollen's is soft mobile without adhesions.  Imaging: XR Finger Middle Left  Result Date: 03/10/2020 2 view radiographs of the right long finger shows no bony abnormalities.  XR Foot 2 Views Left  Result Date: 03/10/2020 2 view radiographs of the left foot shows previous first second and third ray amputations with no lytic bony changes no radiographic signs of osteomyelitis.  No images are attached to the encounter.  Labs: Lab Results  Component Value Date   HGBA1C 5.8 (H) 02/05/2020   HGBA1C 5.2 10/25/2016   HGBA1C 6.1 (H) 04/03/2016   ESRSEDRATE 14 01/28/2020   ESRSEDRATE 7 01/12/2020   CRP 1.2 (H) 01/28/2020   CRP 0.8 01/12/2020   LABURIC 5.2 12/09/2018   LABURIC 7.4 08/18/2015   LABURIC 4.1 12/01/2011   REPTSTATUS 02/01/2020 FINAL 01/27/2020   REPTSTATUS 02/01/2020 FINAL 01/27/2020   CULT  01/27/2020    NO GROWTH 5 DAYS Performed at [facility], [place] [address] [place], [place] [address]    CULT  01/27/2020    NO GROWTH 5 DAYS Performed at [facility] [place],  [place] [address]      Lab Results  Component Value Date   ALBUMIN 2.7 (L) 02/02/2020   ALBUMIN 2.9 (L) 01/31/2020   ALBUMIN 2.8 (L) 01/31/2020   LABURIC 5.2 12/09/2018   LABURIC 7.4 08/18/2015   LABURIC 4.1 12/01/2011    Lab Results  Component Value Date   MG 1.9 02/12/2020   MG 1.7 02/11/2020   MG 1.6 (L) 02/10/2020   No results found for: VD25OH  No results found for: PREALBUMIN CBC EXTENDED Latest Ref Rng & Units 02/19/2020 02/14/2020 02/13/2020  WBC 4.0 - 10.5 K/uL 3.4(L) 3.1(L) 2.7(L)  RBC 4.22 - 5.81 MIL/uL 3.73(L) 3.39(L) 3.44(L)  HGB 13.0 - 17.0 g/dL 9.6(L) 9.0(L) 9.0(L)  HCT 39.0 - 52.0 % 31.1(L) 28.4(L) 29.2(L)  PLT 150 - 400 K/uL 264 244 247  NEUTROABS 1.7 - 7.7 K/uL  - - -  LYMPHSABS 0.7 - 4.0 K/uL - - -     Body mass index is 28.5 kg/m.  Orders:  Orders Placed This Encounter  Procedures  . XR Finger Middle Left  . XR Foot 2 Views Left   No orders of the defined types were placed in this encounter.    Procedures: No procedures performed  Clinical Data: No additional findings.  ROS:  All other systems negative, except as noted in the HPI. Review of Systems  Objective: Vital Signs: Ht 5\' 9"  (1.753 m)   Wt 193 lb (87.5 kg)   BMI 28.50 kg/m   Specialty Comments:  No specialty comments available.  PMFS History: Patient Active Problem List   Diagnosis Date Noted  . Wound dehiscence, surgical, initial encounter   . Pressure injury of skin 01/28/2020  . Osteomyelitis ([place]) 01/27/2020  . Sepsis ([place]) 01/27/2020  . Cellulitis of left foot   . Left foot infection 01/11/2020  . Chronic combined systolic and diastolic heart failure ([place]) 03/06/2019  . Coagulation disorder ([place]) 03/06/2019  . Impacted cerumen of both ears 02/09/2019  . Osteomyelitis of great toe of left foot ([place]) 01/09/2019  . Foot ulcer, left ([place]) 12/27/2018  . Cellulitis 12/07/2018  . Bleeding from left ear 11/28/2018  . HTN (hypertension) 11/02/2018  . UTI (urinary tract infection) 11/02/2018  . Ecchymosis   . Tachyarrhythmia 08/30/2018  . Hypomagnesemia 08/30/2018  . Recurrent pulmonary emboli ([place])   . Hematuria   . Delirium tremens ([place])   . LFTs abnormal   . Alcoholic hepatitis without ascites   . Alcohol withdrawal ([place]) 06/04/2018  . Dizziness   . Posterior tibial tendon dysfunction 02/21/2018  . Supratherapeutic INR 11/27/2016  . Dehydration 11/27/2016  . SVT (supraventricular tachycardia) ([place])   . S/P minimally-invasive mitral valve repair 10/27/2016  . History of pulmonary embolus (PE) 04/11/2016  . Left sided numbness   . Anxiety 04/02/2016  . Panic attacks 04/02/2016  . Mitral valve prolapse   . Mitral regurgitation 01/20/2016  .  Atrial tachycardia ([place]) 01/06/2016  . Murmur 01/06/2016  . Mobitz I 10/09/2015  . PAC (premature atrial contraction) 10/09/2015  . Arthritis 08/14/2015  . Neuropathy due to chemotherapeutic drug ([place]) 03/09/2013  . Leucopenia 01/31/2013  . Personal history of pulmonary embolism 12/28/2012  . Alcohol abuse 08/24/2012  . Cigarette smoker 08/24/2012  . Hemorrhoid 03/08/2012  . Anemia 12/10/2011  . History of non-Hodgkin's lymphoma 09/25/2011   Past Medical History:  Diagnosis Date  . Acute pulmonary embolism ([place]) 04/11/2016  . Anxiety   . Arthritis   . Atrial tachycardia ([place]) 01/06/2016  . Depression   .  DTs (delirium tremens) ([place])   . Dyspnea   . ED (erectile dysfunction)   . ETOH abuse   . Hypertension   . Lymphoma, non Hodgkin's 09/25/2011   Stage 2  . Mitral regurgitation 01/20/2016  . Mobitz I 10/09/2015  . Murmur 01/06/2016  . Occasional tremors   . PAC (premature atrial contraction) 10/09/2015  . S/P minimally-invasive mitral valve repair 10/27/2016   Complex valvuloplasty including artificial Gore-tex neochord placement x6 and 34 mm Edwards Physio II ring annuloplasty via right mini thoracotomy approach    Family History  Problem Relation Age of Onset  . Cancer Mother        BREAST(BONE)  . Cancer Father        PANCREATIC  . Hypertension Maternal Grandmother   . Stroke Maternal Aunt   . Heart attack Neg Hx     Past Surgical History:  Procedure Laterality Date  . AMPUTATION Left 01/12/2019   Procedure: LEFT GREAT TOE AMPUTATION;  Surgeon: [name], MD;  Location: [facility];  Service: Orthopedics;  Laterality: Left;  . AMPUTATION Left 01/16/2020   Procedure: LEFT 1ST METATARSAL AND SECOND TOE AMPUTATION;  Surgeon: [name], MD;  Location: [facility];  Service: Orthopedics;  Laterality: Left;  . AMPUTATION Left 01/30/2020   Procedure: LEFT FOOT 3RD RAY AMPUTATION;  Surgeon: [name], MD;  Location: [facility];  Service: Orthopedics;  Laterality: Left;  . CARDIAC  CATHETERIZATION N/A 08/13/2016   Procedure: Right/Left Heart Cath and Coronary Angiography;  Surgeon: [name], MD;  Location: [facility];  Service: Cardiovascular;  Laterality: N/A;  . MITRAL VALVE REPAIR Right 10/27/2016   Procedure: MINIMALLY INVASIVE MITRAL VALVE REPAIR (MVR) USING 34 PHYSIO II ANNULOPLASTY RING;  Surgeon: [name], MD;  Location: [facility];  Service: Open Heart Surgery;  Laterality: Right;  . SKIN BIOPSY Right 04/04/2018   right mid medial anterior tibial shave  see report in chart  . TEE WITHOUT CARDIOVERSION N/A 02/11/2016   Procedure: TRANSESOPHAGEAL ECHOCARDIOGRAM (TEE);  Surgeon: [name], MD;  Location: [facility];  Service: Cardiovascular;  Laterality: N/A;  . TEE WITHOUT CARDIOVERSION N/A 10/27/2016   Procedure: TRANSESOPHAGEAL ECHOCARDIOGRAM (TEE);  Surgeon: [name], MD;  Location: [facility];  Service: Open Heart Surgery;  Laterality: N/A;  . TRIGGER FINGER RELEASE Bilateral    Social History   Occupational History  . Occupation: unemployed  Tobacco Use  . Smoking status: Current Every Fonseca Smoker    Packs/Gearing: 1.00    Years: 40.00    Pack years: 40.00    Types: Cigarettes, E-cigarettes  . Smokeless tobacco: Former Systems developer    Quit date: 1985  . Tobacco comment: vapes all [place]  Substance and Sexual Activity  . Alcohol use: Yes    Alcohol/week: 0.0 standard drinks    Comment: 1-2 bottles of red wine a [place]  . Drug use: No  . Sexual activity: Not Currently

## 2020-03-11 ENCOUNTER — Telehealth: Payer: Self-pay | Admitting: Orthopedic Surgery

## 2020-03-11 ENCOUNTER — Ambulatory Visit: Payer: Self-pay | Admitting: Orthopedic Surgery

## 2020-03-11 NOTE — Telephone Encounter (Signed)
Noted  

## 2020-03-11 NOTE — Telephone Encounter (Signed)
Pt called wanting to make everyone on Dr. Jess Barters team know the compression socks are working and his right foot is almost normal, pt stated the left foot is a bit more stubborn but coming along and overall he's feeling more comfortable; pt would like a call back with any commentary.  [phone]

## 2020-03-12 ENCOUNTER — Ambulatory Visit: Payer: Self-pay | Admitting: Nurse Practitioner

## 2020-03-13 ENCOUNTER — Ambulatory Visit: Payer: Self-pay | Admitting: Pharmacist

## 2020-03-18 ENCOUNTER — Other Ambulatory Visit: Payer: Self-pay

## 2020-03-18 ENCOUNTER — Observation Stay (HOSPITAL_COMMUNITY): Payer: Medicaid Other

## 2020-03-18 ENCOUNTER — Encounter (HOSPITAL_COMMUNITY): Payer: Self-pay | Admitting: Internal Medicine

## 2020-03-18 ENCOUNTER — Emergency Department (HOSPITAL_COMMUNITY): Payer: Medicaid Other

## 2020-03-18 ENCOUNTER — Inpatient Hospital Stay (HOSPITAL_COMMUNITY)
Admission: EM | Admit: 2020-03-18 | Discharge: 2020-04-04 | DRG: 240 | Disposition: A | Discharge status: Home / Self Care | Payer: Medicaid Other | Attending: Family Medicine | Admitting: Family Medicine

## 2020-03-18 DIAGNOSIS — J811 Chronic pulmonary edema: Secondary | ICD-10-CM | POA: Diagnosis present

## 2020-03-18 DIAGNOSIS — Z7901 Long term (current) use of anticoagulants: Secondary | ICD-10-CM

## 2020-03-18 DIAGNOSIS — Z86711 Personal history of pulmonary embolism: Secondary | ICD-10-CM

## 2020-03-18 DIAGNOSIS — Y835 Amputation of limb(s) as the cause of abnormal reaction of the patient, or of later complication, without mention of misadventure at the time of the procedure: Secondary | ICD-10-CM | POA: Diagnosis present

## 2020-03-18 DIAGNOSIS — I5030 Unspecified diastolic (congestive) heart failure: Secondary | ICD-10-CM

## 2020-03-18 DIAGNOSIS — R079 Chest pain, unspecified: Secondary | ICD-10-CM

## 2020-03-18 DIAGNOSIS — D62 Acute posthemorrhagic anemia: Secondary | ICD-10-CM | POA: Diagnosis not present

## 2020-03-18 DIAGNOSIS — G620 Drug-induced polyneuropathy: Secondary | ICD-10-CM

## 2020-03-18 DIAGNOSIS — T451X5A Adverse effect of antineoplastic and immunosuppressive drugs, initial encounter: Secondary | ICD-10-CM | POA: Diagnosis present

## 2020-03-18 DIAGNOSIS — M86272 Subacute osteomyelitis, left ankle and foot: Secondary | ICD-10-CM

## 2020-03-18 DIAGNOSIS — H1131 Conjunctival hemorrhage, right eye: Secondary | ICD-10-CM | POA: Diagnosis present

## 2020-03-18 DIAGNOSIS — M79671 Pain in right foot: Secondary | ICD-10-CM

## 2020-03-18 DIAGNOSIS — N529 Male erectile dysfunction, unspecified: Secondary | ICD-10-CM | POA: Diagnosis present

## 2020-03-18 DIAGNOSIS — T8781 Dehiscence of amputation stump: Secondary | ICD-10-CM | POA: Diagnosis present

## 2020-03-18 DIAGNOSIS — Z8572 Personal history of non-Hodgkin lymphomas: Secondary | ICD-10-CM

## 2020-03-18 DIAGNOSIS — F419 Anxiety disorder, unspecified: Secondary | ICD-10-CM | POA: Diagnosis present

## 2020-03-18 DIAGNOSIS — T8131XA Disruption of external operation (surgical) wound, not elsewhere classified, initial encounter: Secondary | ICD-10-CM

## 2020-03-18 DIAGNOSIS — Z20822 Contact with and (suspected) exposure to covid-19: Secondary | ICD-10-CM | POA: Diagnosis present

## 2020-03-18 DIAGNOSIS — M1990 Unspecified osteoarthritis, unspecified site: Secondary | ICD-10-CM | POA: Diagnosis present

## 2020-03-18 DIAGNOSIS — I5031 Acute diastolic (congestive) heart failure: Secondary | ICD-10-CM

## 2020-03-18 DIAGNOSIS — I5033 Acute on chronic diastolic (congestive) heart failure: Secondary | ICD-10-CM | POA: Diagnosis present

## 2020-03-18 DIAGNOSIS — R791 Abnormal coagulation profile: Secondary | ICD-10-CM | POA: Diagnosis present

## 2020-03-18 DIAGNOSIS — D72819 Decreased white blood cell count, unspecified: Secondary | ICD-10-CM | POA: Diagnosis present

## 2020-03-18 DIAGNOSIS — D509 Iron deficiency anemia, unspecified: Secondary | ICD-10-CM | POA: Diagnosis present

## 2020-03-18 DIAGNOSIS — D688 Other specified coagulation defects: Secondary | ICD-10-CM | POA: Diagnosis present

## 2020-03-18 DIAGNOSIS — I471 Supraventricular tachycardia: Secondary | ICD-10-CM | POA: Diagnosis present

## 2020-03-18 DIAGNOSIS — F1010 Alcohol abuse, uncomplicated: Secondary | ICD-10-CM | POA: Diagnosis present

## 2020-03-18 DIAGNOSIS — Z79899 Other long term (current) drug therapy: Secondary | ICD-10-CM

## 2020-03-18 DIAGNOSIS — Z8249 Family history of ischemic heart disease and other diseases of the circulatory system: Secondary | ICD-10-CM

## 2020-03-18 DIAGNOSIS — Z9889 Other specified postprocedural states: Secondary | ICD-10-CM

## 2020-03-18 DIAGNOSIS — Z91018 Allergy to other foods: Secondary | ICD-10-CM

## 2020-03-18 DIAGNOSIS — F17210 Nicotine dependence, cigarettes, uncomplicated: Secondary | ICD-10-CM | POA: Diagnosis present

## 2020-03-18 DIAGNOSIS — J810 Acute pulmonary edema: Secondary | ICD-10-CM

## 2020-03-18 DIAGNOSIS — I110 Hypertensive heart disease with heart failure: Principal | ICD-10-CM | POA: Diagnosis present

## 2020-03-18 LAB — BASIC METABOLIC PANEL
Anion gap: 11 (ref 5–15)
BUN: 8 mg/dL (ref 6–20)
CO2: 23 mmol/L (ref 22–32)
Calcium: 8.9 mg/dL (ref 8.9–10.3)
Chloride: 108 mmol/L (ref 98–111)
Creatinine, Ser: 0.64 mg/dL (ref 0.61–1.24)
GFR calc Af Amer: >60 mL/min (ref >60)
GFR calc non Af Amer: >60 mL/min (ref >60)
Glucose, Bld: 95 mg/dL (ref 70–99)
Potassium: 4.2 mmol/L (ref 3.5–5.1)
Sodium: 142 mmol/L (ref 135–145)

## 2020-03-18 LAB — CBC
HCT: 35 % — ABNORMAL LOW (ref 39.0–52.0)
Hemoglobin: 11 g/dL — ABNORMAL LOW (ref 13.0–17.0)
MCH: 24.7 pg — ABNORMAL LOW (ref 26.0–34.0)
MCHC: 31.4 g/dL (ref 30.0–36.0)
MCV: 78.7 fL — ABNORMAL LOW (ref 80.0–100.0)
Platelets: 270 10*3/uL (ref 150–400)
RBC: 4.45 MIL/uL (ref 4.22–5.81)
RDW: 14.4 % (ref 11.5–15.5)
WBC: 3.1 10*3/uL — ABNORMAL LOW (ref 4.0–10.5)
nRBC: 0.7 % — ABNORMAL HIGH (ref 0.0–0.2)

## 2020-03-18 LAB — SEDIMENTATION RATE: Sed Rate: 17 mm/hr — ABNORMAL HIGH (ref 0–16)

## 2020-03-18 LAB — TROPONIN I (HIGH SENSITIVITY)
Troponin I (High Sensitivity): 7 ng/L (ref <18)
Troponin I (High Sensitivity): 8 ng/L (ref <18)

## 2020-03-18 LAB — C-REACTIVE PROTEIN: CRP: 0.5 mg/dL (ref <1.0)

## 2020-03-18 LAB — PROTIME-INR
INR: >10 (ref 0.8–1.2)
Prothrombin Time: >90 seconds — ABNORMAL HIGH (ref 11.4–15.2)

## 2020-03-18 LAB — BRAIN NATRIURETIC PEPTIDE: B Natriuretic Peptide: 90.8 pg/mL (ref 0.0–100.0)

## 2020-03-18 LAB — SARS CORONAVIRUS 2 (TAT 6-24 HRS): SARS Coronavirus 2: NEGATIVE

## 2020-03-18 LAB — ECHOCARDIOGRAM COMPLETE

## 2020-03-18 MED ORDER — ASPIRIN 81 MG PO CHEW
324.0000 mg | CHEWABLE_TABLET | Freq: Once | ORAL | Status: AC
  Administered 2020-03-18: 324 mg via ORAL
  Filled 2020-03-18: qty 4

## 2020-03-18 MED ORDER — ALPRAZOLAM 0.25 MG PO TABS
0.2500 mg | ORAL_TABLET | Freq: Two times a day (BID) | ORAL | Status: DC | PRN
  Administered 2020-03-19 – 2020-04-03 (×17): 0.25 mg via ORAL
  Filled 2020-03-18 (×18): qty 1

## 2020-03-18 MED ORDER — SODIUM CHLORIDE 0.9% FLUSH: 3.0000 mL | INTRAVENOUS | Status: DC | PRN

## 2020-03-18 MED ORDER — FOLIC ACID 1 MG PO TABS
1.0000 mg | ORAL_TABLET | Freq: Every day | ORAL | Status: DC
  Administered 2020-03-18 – 2020-04-04 (×18): 1 mg via ORAL
  Filled 2020-03-18 (×18): qty 1

## 2020-03-18 MED ORDER — ACETAMINOPHEN 325 MG PO TABS
650.0000 mg | ORAL_TABLET | ORAL | Status: DC | PRN
  Administered 2020-03-22 – 2020-04-04 (×6): 650 mg via ORAL
  Filled 2020-03-18 (×6): qty 2

## 2020-03-18 MED ORDER — ADULT MULTIVITAMIN W/MINERALS CH
1.0000 | ORAL_TABLET | Freq: Every day | ORAL | Status: DC
  Administered 2020-03-18 – 2020-04-04 (×18): 1 via ORAL
  Filled 2020-03-18 (×18): qty 1

## 2020-03-18 MED ORDER — GABAPENTIN 100 MG PO CAPS
100.0000 mg | ORAL_CAPSULE | Freq: Three times a day (TID) | ORAL | Status: DC
  Administered 2020-03-18 – 2020-03-28 (×30): 100 mg via ORAL
  Filled 2020-03-18 (×31): qty 1

## 2020-03-18 MED ORDER — SODIUM CHLORIDE 0.9 % IV SOLN: 250.0000 mL | INTRAVENOUS | Status: DC | PRN

## 2020-03-18 MED ORDER — SODIUM CHLORIDE 0.9% FLUSH
3.0000 mL | Freq: Two times a day (BID) | INTRAVENOUS | Status: DC
  Administered 2020-03-19 – 2020-04-03 (×30): 3 mL via INTRAVENOUS

## 2020-03-18 MED ORDER — FUROSEMIDE 10 MG/ML IJ SOLN
40.0000 mg | Freq: Once | INTRAMUSCULAR | Status: AC
  Administered 2020-03-18: 40 mg via INTRAVENOUS
  Filled 2020-03-18: qty 4

## 2020-03-18 MED ORDER — LORAZEPAM 1 MG PO TABS
1.0000 mg | ORAL_TABLET | ORAL | Status: AC | PRN
  Administered 2020-03-18: 1 mg via ORAL
  Administered 2020-03-18: 2 mg via ORAL
  Administered 2020-03-20: 1 mg via ORAL
  Filled 2020-03-18: qty 4
  Filled 2020-03-18 (×2): qty 1

## 2020-03-18 MED ORDER — WARFARIN - PHARMACIST DOSING INPATIENT: Freq: Every day | Status: DC

## 2020-03-18 MED ORDER — DOCUSATE SODIUM 100 MG PO CAPS
100.0000 mg | ORAL_CAPSULE | Freq: Two times a day (BID) | ORAL | Status: DC
  Administered 2020-03-18 – 2020-04-03 (×27): 100 mg via ORAL
  Filled 2020-03-18 (×35): qty 1

## 2020-03-18 MED ORDER — FUROSEMIDE 10 MG/ML IJ SOLN
40.0000 mg | Freq: Two times a day (BID) | INTRAMUSCULAR | Status: DC
  Administered 2020-03-18 – 2020-03-20 (×4): 40 mg via INTRAVENOUS
  Filled 2020-03-18 (×4): qty 4

## 2020-03-18 MED ORDER — METOPROLOL SUCCINATE ER 50 MG PO TB24
50.0000 mg | ORAL_TABLET | Freq: Every day | ORAL | Status: DC
  Administered 2020-03-19 – 2020-03-28 (×10): 50 mg via ORAL
  Filled 2020-03-18 (×9): qty 1

## 2020-03-18 MED ORDER — LORAZEPAM 2 MG/ML IJ SOLN
0.0000 mg | Freq: Two times a day (BID) | INTRAMUSCULAR | Status: AC
  Administered 2020-03-20 – 2020-03-21 (×4): 1 mg via INTRAVENOUS
  Filled 2020-03-18 (×4): qty 1

## 2020-03-18 MED ORDER — LORAZEPAM 2 MG/ML IJ SOLN
0.0000 mg | Freq: Four times a day (QID) | INTRAMUSCULAR | Status: AC
  Administered 2020-03-18 – 2020-03-20 (×3): 1 mg via INTRAVENOUS
  Filled 2020-03-18 (×4): qty 1

## 2020-03-18 MED ORDER — THIAMINE HCL 100 MG/ML IJ SOLN: 100.0000 mg | Freq: Every day | INTRAMUSCULAR | Status: DC

## 2020-03-18 MED ORDER — LORAZEPAM 2 MG/ML IJ SOLN
1.0000 mg | INTRAMUSCULAR | Status: AC | PRN
  Administered 2020-03-19: 1 mg via INTRAVENOUS
  Filled 2020-03-18: qty 1

## 2020-03-18 MED ORDER — NICOTINE 21 MG/24HR TD PT24
21.0000 mg | MEDICATED_PATCH | Freq: Every day | TRANSDERMAL | Status: DC
  Administered 2020-03-18 – 2020-04-04 (×18): 21 mg via TRANSDERMAL
  Filled 2020-03-18 (×18): qty 1

## 2020-03-18 MED ORDER — SODIUM CHLORIDE 0.9% FLUSH: 3.0000 mL | Freq: Once | INTRAVENOUS | Status: DC

## 2020-03-18 MED ORDER — NITROGLYCERIN 0.4 MG SL SUBL: 0.4000 mg | SUBLINGUAL_TABLET | SUBLINGUAL | Status: DC | PRN

## 2020-03-18 MED ORDER — PHYTONADIONE 5 MG PO TABS
2.5000 mg | ORAL_TABLET | Freq: Once | ORAL | Status: AC
  Administered 2020-03-18: 2.5 mg via ORAL
  Filled 2020-03-18: qty 1

## 2020-03-18 MED ORDER — ONDANSETRON HCL 4 MG/2ML IJ SOLN: 4.0000 mg | Freq: Four times a day (QID) | INTRAMUSCULAR | Status: DC | PRN

## 2020-03-18 MED ORDER — THIAMINE HCL 100 MG PO TABS
100.0000 mg | ORAL_TABLET | Freq: Every day | ORAL | Status: DC
  Administered 2020-03-18 – 2020-04-04 (×18): 100 mg via ORAL
  Filled 2020-03-18 (×18): qty 1

## 2020-03-18 MED ORDER — OXYCODONE HCL 5 MG PO TABS
5.0000 mg | ORAL_TABLET | Freq: Four times a day (QID) | ORAL | Status: DC | PRN
  Administered 2020-03-18 – 2020-03-27 (×21): 5 mg via ORAL
  Filled 2020-03-18 (×23): qty 1

## 2020-03-18 NOTE — ED Notes (Signed)
Pt transported to xray 

## 2020-03-18 NOTE — ED Triage Notes (Signed)
Pt arrived by EMS for CP x 2 days with generalized weakness and intermittent nausea. 300NS given enroute. Lung sounds clear. Given 324mg  asa. EMS VS 97.4, pulse 86, 134/90, 98%RA

## 2020-03-18 NOTE — ED Notes (Signed)
Lunch Tray Ordered 

## 2020-03-18 NOTE — ED Notes (Signed)
Noted pt right eye red, hospitalist Tamala Julian MD made aware.

## 2020-03-18 NOTE — Progress Notes (Signed)
ANTICOAGULATION CONSULT NOTE - Initial Consult  Pharmacy Consult for warfarin Indication: hx VTE, MV repair  Vital Signs: Temp: 98.1 F (36.7 C) (05/11 0541) Temp Source: Oral (05/11 0541) BP: 133/106 (05/11 0730) Pulse Rate: 101 (05/11 0730)  Labs: Recent Labs    03/18/20 0207 03/18/20 0409  HGB 11.0*  --   HCT 35.0*  --   PLT 270  --   CREATININE 0.64  --   TROPONINIHS 7 8    Estimated Creatinine Clearance: 107.5 mL/min (by C-G formula based on SCr of 0.64 mg/dL).   Medical History: Past Medical History:  Diagnosis Date  . Acute pulmonary embolism ([place]) 04/11/2016  . Anxiety   . Arthritis   . Atrial tachycardia ([place]) 01/06/2016  . Depression   . DTs (delirium tremens) ([place])   . Dyspnea   . ED (erectile dysfunction)   . ETOH abuse   . Hypertension   . Lymphoma, non Hodgkin's 09/25/2011   Stage 2  . Mitral regurgitation 01/20/2016  . Mobitz I 10/09/2015  . Murmur 01/06/2016  . Occasional tremors   . PAC (premature atrial contraction) 10/09/2015  . S/P minimally-invasive mitral valve repair 10/27/2016   Complex valvuloplasty including artificial Gore-tex neochord placement x6 and 34 mm Edwards Physio II ring annuloplasty via right mini thoracotomy approach   Assessment: 61 yom presented to the ED with CP and SOB. He is on chronic warfarin. INR is >10. No bleeding noted. Hgb is slightly low and platelets are WNL. Pharmacy to manage warfarin.   PTA regimen: 7.5mg  daily  Goal of Therapy:  INR 2-3 Monitor platelets by anticoagulation protocol: Yes   Plan:  No warfarin today Daily INR  Temesgen Weightman, Rande Lawman 03/18/2020,8:02 AM

## 2020-03-18 NOTE — H&P (Signed)
History and Physical    Leonard Bentley IWP:[id number] DOB: 06/12/1961 DOA: 03/18/2020  Referring MD/NP/PA: Gean Birchwood, MD PCP: [name], NP  Patient coming from: Via EMS  Chief Complaint: Chest tightness  I have personally briefly reviewed patient's old medical records in [place]   HPI: Leonard Bentley is a 59 y.o. male with medical history significant of HTN, chronic diastolic CHF, severe MR s/p mitral valve repair, recurrent PE, anxiety, non-Hodgkin's lymphoma, osteomyelitis of the left foot, and alcohol abuse presents with complaints of 1 week of chest tightness and shortness of breath.  He has had a dry nonproductive cough.  Associated symptoms include complaints of leg swelling.  He reports having worsening pins on needles sensation in both of his feet.  Last hospitalized in March for left foot wound dehiscence with concern for possible osteomyelitis s/p left third ray amputation by Dr. [name].  He had been wearing compression stockings has advised by Dr. [name].  Notes he had been cleared to start bearing weight on his left foot, but noted that he had not been able to get around much due to the pain.  The wound had been closed to his knowledge and he had not noticed any drainage or redness.  Denies having any significant fever or chills.  En route with EMS patient given 300 mL of normal saline IV fluids and was given 324 mg of aspirin to chew .  Vital signs were stable.   ED Course: Upon admission into the emergency department patient was noted to be afebrile, heart rates elevated up into the 140s temporarily, blood pressures elevated to 133/106, and all other vital signs maintained.  Labs significant for WBC 3.1, hemoglobin 11, troponins negative x2, BNP pending, and INR pending. Chest x-ray significant for mild interstitial edema.  Patient was given 40 mg of Lasix IV.  TRH called to admit.  Review of Systems  Constitutional: Positive for malaise/fatigue. Negative for fever.    HENT: Negative for ear discharge and nosebleeds.   Eyes: Positive for redness. Negative for pain.  Respiratory: Positive for cough and shortness of breath.   Cardiovascular: Positive for leg swelling. Negative for chest pain.  Gastrointestinal: Negative for abdominal pain, diarrhea, nausea and vomiting.  Genitourinary: Negative for dysuria and hematuria.  Musculoskeletal: Positive for joint pain and myalgias. Negative for falls.  Skin: Negative for rash.  Neurological: Positive for sensory change. Negative for loss of consciousness.  Endo/Heme/Allergies: Negative for polydipsia.  Psychiatric/Behavioral: Positive for substance abuse. The patient is nervous/anxious.     Past Medical History:  Diagnosis Date  . Acute pulmonary embolism ([place]) 04/11/2016  . Anxiety   . Arthritis   . Atrial tachycardia ([place]) 01/06/2016  . Depression   . DTs (delirium tremens) ([place])   . Dyspnea   . ED (erectile dysfunction)   . ETOH abuse   . Hypertension   . Lymphoma, non Hodgkin's 09/25/2011   Stage 2  . Mitral regurgitation 01/20/2016  . Mobitz I 10/09/2015  . Murmur 01/06/2016  . Occasional tremors   . PAC (premature atrial contraction) 10/09/2015  . S/P minimally-invasive mitral valve repair 10/27/2016   Complex valvuloplasty including artificial Gore-tex neochord placement x6 and 34 mm Edwards Physio II ring annuloplasty via right mini thoracotomy approach    Past Surgical History:  Procedure Laterality Date  . AMPUTATION Left 01/12/2019   Procedure: LEFT GREAT TOE AMPUTATION;  Surgeon: [name], MD;  Location: [facility];  Service: Orthopedics;  Laterality: Left;  .  AMPUTATION Left 01/16/2020   Procedure: LEFT 1ST METATARSAL AND SECOND TOE AMPUTATION;  Surgeon: [name], MD;  Location: [place];  Service: Orthopedics;  Laterality: Left;  . AMPUTATION Left 01/30/2020   Procedure: LEFT FOOT 3RD RAY AMPUTATION;  Surgeon: [name], MD;  Location: [facility];  Service: Orthopedics;  Laterality:  Left;  . CARDIAC CATHETERIZATION N/A 08/13/2016   Procedure: Right/Left Heart Cath and Coronary Angiography;  Surgeon: [name], MD;  Location: [facility];  Service: Cardiovascular;  Laterality: N/A;  . MITRAL VALVE REPAIR Right 10/27/2016   Procedure: MINIMALLY INVASIVE MITRAL VALVE REPAIR (MVR) USING 34 PHYSIO II ANNULOPLASTY RING;  Surgeon: [name], MD;  Location: [place] [facility];  Service: Open Heart Surgery;  Laterality: Right;  . SKIN BIOPSY Right 04/04/2018   right mid medial anterior tibial shave  see report in chart  . TEE WITHOUT CARDIOVERSION N/A 02/11/2016   Procedure: TRANSESOPHAGEAL ECHOCARDIOGRAM (TEE);  Surgeon: [name], MD;  Location: [facility];  Service: Cardiovascular;  Laterality: N/A;  . TEE WITHOUT CARDIOVERSION N/A 10/27/2016   Procedure: TRANSESOPHAGEAL ECHOCARDIOGRAM (TEE);  Surgeon: [name], MD;  Location: [place] [facility];  Service: Open Heart Surgery;  Laterality: N/A;  . TRIGGER FINGER RELEASE Bilateral      reports that he has been smoking cigarettes and e-cigarettes. He has a 40.00 pack-year smoking history. He quit smokeless tobacco use about 36 years ago. He reports current alcohol use. He reports that he does not use drugs.  Allergies  Allergen Reactions  . Other Other (See Comments)    "Cactus" caused blisters on tongue (if prepared as food)    Family History  Problem Relation Age of Onset  . Cancer Mother        BREAST(BONE)  . Cancer Father        PANCREATIC  . Hypertension Maternal Grandmother   . Stroke Maternal Aunt   . Heart attack Neg Hx     Prior to Admission medications   Medication Sig Start Date End Date Taking? Authorizing Provider  acetaminophen (TYLENOL) 325 MG tablet Take 2 tablets (650 mg total) by mouth every 6 (six) hours as needed for mild pain (or Fever >/= 101). 02/25/20  Yes Ghimire, Henreitta Leber, MD  ALPRAZolam Duanne Moron) 0.25 MG tablet Take 1 tablet (0.25 mg total) by mouth 2 (two) times daily as needed for  anxiety. 03/07/20  Yes [name], MD  docusate sodium (COLACE) 100 MG capsule Take 1 capsule (100 mg total) by mouth 2 (two) times daily. 02/25/20 03/26/20 Yes Ghimire, Henreitta Leber, MD  gabapentin (NEURONTIN) 100 MG capsule Take 1 capsule (100 mg total) by mouth 3 (three) times daily. 02/25/20  Yes Ghimire, Henreitta Leber, MD  metoprolol succinate (TOPROL-XL) 50 MG 24 hr tablet Take 1 tablet (50 mg total) by mouth daily. Take with or immediately following a meal. 02/25/20  Yes Ghimire, Henreitta Leber, MD  oxyCODONE (OXY IR/ROXICODONE) 5 MG immediate release tablet Take 1 tablet (5 mg total) by mouth every 6 (six) hours as needed for moderate pain (pain score 4-6). 03/07/20  Yes [name], MD  warfarin (COUMADIN) 7.5 MG tablet Take 1 tablet by mouth daily. Follow-up with [facility] for INR and dosing instructions 02/25/20  Yes Ghimire, Henreitta Leber, MD  nicotine (NICODERM CQ - DOSED IN MG/24 HOURS) 21 mg/24hr patch Place 1 patch (21 mg total) onto the skin daily. 02/25/20   Ghimire, Henreitta Leber, MD    Physical Exam:  Constitutional: Older male who appears to be anxious, but in no acute distress Vitals:   03/18/20 0157 03/18/20 0541 03/18/20 0545 03/18/20 0700  BP: (!) 139/100 (!) 135/112  (!) 131/100  Pulse: 93 86 79 93  Resp: '16 19 15 15  '$ Temp: 98.6 F (37 C) 98.1 F (36.7 C)    TempSrc: Oral Oral    SpO2: 98% 99% 100% 99%   Eyes: Conjunctival hemorrhage noted on the medial aspect of the right eye. ENMT: Mucous membranes are moist. Posterior pharynx clear of any exudate or lesions.  Neck: normal, supple, no masses, no thyromegaly Respiratory: Mild crackles appreciated.  Patient with normal respiratory effort at this time maintaining O2 saturations on room air. Cardiovascular: Tachycardic, no murmurs / rubs / gallops. No extremity edema. 2+ pedal pulses. No carotid bruits.  Abdomen: no tenderness, no masses palpated. No hepatosplenomegaly. Bowel sounds positive.  Musculoskeletal: no clubbing /  cyanosis.  Right amputation of the left foot. Skin: Wound dehiscence noted at the left foot near previous amputation.  No significant erythema appreciated. Ulcer noted to be on the first digit of the right foot. Neurologic: CN 2-12 grossly intact DTR normal. Strength 5/5 in all 4.  Tremor present. Psychiatric: Normal judgment and insight. Alert and oriented x 3.  Anxious mood.     Labs on Admission: I have personally reviewed following labs and imaging studies  CBC: Recent Labs  Lab 03/18/20 0207  WBC 3.1*  HGB 11.0*  HCT 35.0*  MCV 78.7*  PLT 867   Basic Metabolic Panel: Recent Labs  Lab 03/18/20 0207  NA 142  K 4.2  CL 108  CO2 23  GLUCOSE 95  BUN 8  CREATININE 0.64  CALCIUM 8.9   GFR: Estimated Creatinine Clearance: 107.5 mL/min (by C-G formula based on SCr of 0.64 mg/dL). Liver Function Tests: No results for input(s): AST, ALT, ALKPHOS, BILITOT, PROT, ALBUMIN in the last 168 hours. No results for input(s): LIPASE, AMYLASE in the last 168 hours. No results for input(s): AMMONIA in the last 168 hours. Coagulation Profile: No results for input(s): INR, PROTIME in the last 168 hours. Cardiac Enzymes: No results for input(s): CKTOTAL, CKMB, CKMBINDEX, TROPONINI in the last 168 hours. BNP (last 3 results) No results for input(s): PROBNP in the last 8760 hours. HbA1C: No results for input(s): HGBA1C in the last 72 hours. CBG: No results for input(s): GLUCAP in the last 168 hours. Lipid Profile: No results for input(s): CHOL, HDL, LDLCALC, TRIG, CHOLHDL, LDLDIRECT in the last 72 hours. Thyroid Function Tests: No results for input(s): TSH, T4TOTAL, FREET4, T3FREE, THYROIDAB in the last 72 hours. Anemia Panel: No results for input(s): VITAMINB12, FOLATE, FERRITIN, TIBC, IRON, RETICCTPCT in the last 72 hours. Urine analysis:    Component Value Date/Time   COLORURINE YELLOW 01/27/2020 1841   APPEARANCEUR CLEAR 01/27/2020 1841   APPEARANCEUR Clear 08/03/2019 1143    LABSPEC 1.015 01/27/2020 1841   PHURINE 6.0 01/27/2020 1841   GLUCOSEU NEGATIVE 01/27/2020 1841   HGBUR SMALL (A) 01/27/2020 1841   BILIRUBINUR NEGATIVE 01/27/2020 1841   BILIRUBINUR small (A) 08/03/2019 1144   BILIRUBINUR Negative 08/03/2019 1143   KETONESUR 5 (A) 01/27/2020 1841   PROTEINUR 30 (A) 01/27/2020 1841   UROBILINOGEN 1.0 08/03/2019 1144   UROBILINOGEN 1.0 01/25/2013 0825   NITRITE NEGATIVE 01/27/2020 1841   LEUKOCYTESUR NEGATIVE 01/27/2020 1841   Sepsis Labs: No results found for this or any previous visit (from the past 240 hour(s)).   Radiological Exams on Admission:  DG Chest 2 View  Result Date: 03/18/2020 CLINICAL DATA:  Chest pain for 2 days, intermittent nausea, weakness EXAM: CHEST - 2 VIEW COMPARISON:  01/27/2020 FINDINGS: Frontal and lateral views of the chest demonstrates stable mitral valve annuloplasty. Cardiac silhouette is stable. Increased interstitial prominence and central vascular congestion compatible with interstitial edema. No effusion or pneumothorax. No acute bony abnormalities. IMPRESSION: 1. Mild interstitial edema. Electronically Signed   By: Randa Ngo M.D.   On: 03/18/2020 02:19    EKG: Independently reviewed. Sinus rhythm at 89 bpm with first-degree heart block, RBBB, and LAFB similar to previous tracings.  Assessment/Plan Pulmonary edema, history of diastolic congestive heart: Acute.  Patient presented with complaints of chest tightness and shortness of breath. O2 saturations currently maintained on room air. Chest x-ray significant for mild interstitial edema.  BNP was noted to be relatively within normal limits at 90.8 and troponins negative.  Last EF noted to be 55-60% back in 08/2018. Patient was given Lasix 40 mg IV x1 dose in the ED. -Admit to a telemetry bed -Strict intake and output -Daily weight -Check echocardiogram -Give additional dose of Lasix 40 mg IV and 1800 -Reassess in a.m. and determine if further IV diuresis warranted    -Follow-up telemetry overnight.  Question possibility of arrhythmia as cause of symptoms. -Formally consult cardiology if significant change in patient's overall heart function appreciated on echo  Supratherapeutic INR, Conjunctival hemorrhage: Acute.  On admission patient noted to have INR greater than 10. He reported that he had been coughing and was appreciated to have conjunctival hemorrhage the right medial eye. -2.5 g of vitamin K p.o. -Daily monitoring of PT/INR -Holding Coumadin for now  Wound dehiscence of left foot, history of osteomyelitis: Patient has history of osteomyelitis of the left foot for which he just recently had a third ray amputation by Dr. [name] on 3/24. The wound had been closed and he had been wearing compression stockings as advised. However noted increasing pain at the left foot. On physical exam there is mild dehiscence of the wound, but no signs of discharge -Check CRP and ESR -Check x-rays of the left foot -Continue oxycodone as needed for pain -Dr. [name] added to the treatment team  Recurrent PEs on chronic anticoagulation: Patient with history of recurrent PEs on anticoagulation of Coumadin. -Coumadin per pharmacy  History of mitral valve repair  Leukopenia: Chronic.  On admission WBC 3.1 which appears near patient's baseline. -Continue to monitor  Microcytic hyporchromic anemia: Acute. On admission hemoglobin 11 which appears improved from previous, but MCV and MCH noted to be acutely low. -Check CBC, iron, TIBC, and ferritin in a.m.  Neuropathy related with chemotherapy, history of Non-Hodgkin's lymphoma: Patient with history of neuropathy thought to be related with chemotherapy drugs to treat non-Hodgkin's lymphoma.  Previously had been treated with systemic chemotherapy of CHOP and Rituxan last given in 11/2011. -Continue gabapentin  Alcohol abuse: Chronic. Patient still reports drinking a bottle of wine daily. His last drink was last night.   -Counseled on need for cessation of alcohol abuse -CIWA protocols with scheduled Ativan  Anxiety -Continue Xanax as needed  Tobacco abuse -Continue nicotine patch   DVT prophylaxis: Coumadin   Code Status: Full Family Communication: No family requested to be updated at this time Disposition Plan: Possible discharge home in 1 to 2 days Consults called: none  Admission status: Observation  Norval Morton MD [facility] Pager [phone]   If 7PM-7AM, please contact night-coverage [email or web address] Password [email or web address]  03/18/2020, 7:08 AM

## 2020-03-18 NOTE — Progress Notes (Signed)
  Echocardiogram 2D Echocardiogram has been performed.  Leonard Bentley 03/18/2020, 1:41 PM

## 2020-03-18 NOTE — ED Notes (Signed)
Pt became tachy in the high 140's. This Probation officer entered the room to check on the pt. Pt was found sitting at the end of the bed. When asked what he was doing, pt stated that he was trying to use the urinal. This writer asked the pt not to get up or move to the end of the bed without staff present due to his heart rate. Pt expressed his understanding.

## 2020-03-18 NOTE — ED Notes (Signed)
Date and time results received: 03/18/20    Test: INR Critical Value: > 10  Name of Provider Notified: Fuller Plan MD

## 2020-03-18 NOTE — ED Provider Notes (Signed)
TIME SEEN: 5:40 AM  CHIEF COMPLAINT: Chest tightness, shortness of breath  HPI: Patient is a 59 year old male with history of hypertension, PE, atrial tachycardia, mitral valve repair on Coumadin, tobacco use who presents to the emergency department with 1 week of diffuse anterior chest tightness and shortness of breath.  Has had some dry cough as well but no fever.  States sometimes pain will resolve completely.  He is not aware of any aggravating or alleviating factors.  No lower extremity swelling or pain.  Patient recently underwent left foot third ray amputation on 01/30/2020 by Dr. [name].  Echo 08/31/18:  Study Conclusions   - Left ventricle: The cavity size was normal. Wall thickness was  normal. Systolic function was normal. The estimated ejection  fraction was in the range of 55% to 60%. Wall motion was normal;  there were no regional wall motion abnormalities. The study is  not technically sufficient to allow evaluation of LV diastolic  function.  - Ventricular septum: Septal motion showed paradox.  - Mitral valve: Prior procedures included surgical repair. An  annular ring prosthesis was present. Valve area by continuity  equation (using LVOT flow): 3.14 cm^2.  - Left atrium: The atrium was mildly dilated.  - Pericardium, extracardiac: A trivial pericardial effusion was  identified.   Impressions:   - The proximal left pulmonary artery appears mildly dilated.   LHC 08/13/2016:   The left ventricular systolic function is normal.  LV end diastolic pressure is normal.  The left ventricular ejection fraction is 55-65% by visual estimate.  There is moderate mitral valve prolapse.  LV end diastolic pressure is normal.  Normal right heart pressures. CO 5.7 L/min. Cardiac index 2.8. PA sat 65%.  Tortuous abdominal aorta without aneurysm.  No significant coronary artery disease. 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    Restart Xarelto tomorrow.  Aspirin also continued per his home meds.  Followup with Dr. Oval Linsey.   ROS: See HPI Constitutional: no fever  Eyes: no drainage  ENT: no runny nose   Cardiovascular:   chest pain  Resp:  SOB  GI: no vomiting GU: no dysuria Integumentary: no rash  Allergy: no hives  Musculoskeletal: no leg swelling  Neurological: no slurred speech ROS otherwise negative  PAST MEDICAL HISTORY/PAST SURGICAL HISTORY:  Past Medical History:  Diagnosis Date  . Acute pulmonary embolism ([place]) 04/11/2016  . Anxiety   . Arthritis   . Atrial tachycardia ([place]) 01/06/2016  . Depression   . DTs (delirium tremens) ([place])   . Dyspnea   . ED (erectile dysfunction)   . ETOH abuse   . Hypertension   . Lymphoma, non Hodgkin's 09/25/2011   Stage 2  . Mitral regurgitation 01/20/2016  . Mobitz I 10/09/2015  . Murmur 01/06/2016  . Occasional tremors   . PAC (premature atrial contraction) 10/09/2015  . S/P minimally-invasive mitral valve repair 10/27/2016   Complex valvuloplasty including artificial Gore-tex neochord placement x6 and 34 mm Edwards Physio II ring annuloplasty via right mini thoracotomy approach    MEDICATIONS:  Prior to Admission medications   Medication Sig Start Date End Date Taking? Authorizing Provider  acetaminophen (TYLENOL) 325 MG tablet Take 2 tablets (650 mg total) by mouth every 6 (six) hours as needed for mild pain (or Fever >/= 101). 02/25/20   Ghimire, Henreitta Leber, MD  ALPRAZolam Duanne Moron) 0.25 MG tablet Take 1 tablet (0.25 mg total) by mouth 2 (two) times daily as needed  for anxiety. 03/07/20   [name], MD  docusate sodium (COLACE) 100 MG capsule Take 1 capsule (100 mg total) by mouth 2 (two) times daily. 02/25/20 03/26/20  Ghimire, Henreitta Leber, MD  gabapentin (NEURONTIN) 100 MG capsule Take 1 capsule (100 mg total) by mouth 3 (three) times daily. 02/25/20   Ghimire, Henreitta Leber, MD  metoprolol succinate (TOPROL-XL) 50 MG 24 hr tablet Take 1 tablet (50 mg total) by  mouth daily. Take with or immediately following a meal. 02/25/20   Ghimire, Henreitta Leber, MD  nicotine (NICODERM CQ - DOSED IN MG/24 HOURS) 21 mg/24hr patch Place 1 patch (21 mg total) onto the skin daily. 02/25/20   Ghimire, Henreitta Leber, MD  oxyCODONE (OXY IR/ROXICODONE) 5 MG immediate release tablet Take 1 tablet (5 mg total) by mouth every 6 (six) hours as needed for moderate pain (pain score 4-6). 03/07/20   [name], MD  warfarin (COUMADIN) 7.5 MG tablet Take 1 tablet by mouth daily. Follow-up with [facility] Clinic for INR and dosing instructions 02/25/20   Jonetta Osgood, MD    ALLERGIES:  Allergies  Allergen Reactions  . Other Other (See Comments)    "Cactus" caused blisters on tongue (if prepared as food)    SOCIAL HISTORY:  Social History   Tobacco Use  . Smoking status: Current Every [place] Smoker    Packs/Robins: 1.00    Years: 40.00    Pack years: 40.00    Types: Cigarettes, E-cigarettes  . Smokeless tobacco: Former Systems developer    Quit date: 1985  . Tobacco comment: vapes all [employer]  Substance Use Topics  . Alcohol use: Yes    Alcohol/week: 0.0 standard drinks    Comment: 1-2 bottles of red wine a [place]    FAMILY HISTORY: Family History  Problem Relation Age of Onset  . Cancer Mother        BREAST(BONE)  . Cancer Father        PANCREATIC  . Hypertension Maternal Grandmother   . Stroke Maternal Aunt   . Heart attack Neg Hx     EXAM: BP (!) 139/100 (BP Location: Right Arm)   Pulse 93   Temp 98.6 F (37 C) (Oral)   Resp 16   SpO2 98%  CONSTITUTIONAL: Alert and oriented and responds appropriately to questions. Well-appearing; well-nourished HEAD: Normocephalic EYES: Conjunctivae clear, pupils appear equal, EOM appear intact ENT: normal nose; moist mucous membranes NECK: Supple, normal ROM CARD: RRR; S1 and S2 appreciated; no murmurs, no clicks, no rubs, no gallops RESP: Normal chest excursion without splinting or tachypnea; breath sounds clear and equal bilaterally; no  wheezes, no rhonchi, no rales, no hypoxia or respiratory distress, speaking full sentences when at rest but then starts to appear short of breath after speaking for longer period of time and starts to have a nonproductive cough ABD/GI: Normal bowel sounds; non-distended; soft, non-tender, no rebound, no guarding, no peritoneal signs, no hepatosplenomegaly BACK:  The back appears normal EXT: Normal ROM in all joints; no deformity noted, no edema; no cyanosis, no calf tenderness or calf swelling SKIN: Normal color for age and race; warm; no rash on exposed skin NEURO: Moves all extremities equally PSYCH: The patient's mood and manner are appropriate.   MEDICAL DECISION MAKING: Patient here with chest tightness and shortness of breath.  Chest x-ray reviewed/interpreted and appears to show mild diffuse edema.  He is not hypoxic here but does appear intermittently short of breath talking to me and does have  a nonproductive cough.  His last echocardiogram was in 2019 and at that time showed an EF of 50 to 55% with no wall motion abnormalities.  I feel that he will need further work-up for why he now has interstitial edema including a repeat echocardiogram.  Recommended IV diuresis.  Still having some chest tightness and he does have some risk factors for ACS however labs have been reviewed/interpreted and he has had 2 normal high-sensitivity troponins.  His EKG has been reviewed/interpreted and he will shows a bifascicular block but appears similar to his previous.  Will discuss with medicine for admission.  Will give aspirin, nitroglycerin and IV Lasix.  ED PROGRESS: 6:06 AM Discussed patient's case with hospitalist, Dr. Hal Hope.  I have recommended admission and patient (and family if present) agree with this plan. Admitting physician will place admission orders.   I reviewed all nursing notes, vitals, pertinent previous records and reviewed/interpreted all EKGs, lab and urine results, imaging (as  available).     EKG Interpretation  Date/Time:  Tuesday Mar 18 2020 01:44:40 EDT Ventricular Rate:  89 PR Interval:  208 QRS Duration: 122 QT Interval:  408 QTC Calculation: 496 R Axis:   -46 Text Interpretation: Sinus rhythm with marked sinus arrhythmia Right bundle branch block Left anterior fascicular block No significant change since last tracing Confirmed by Pryor Curia [phone]) on 03/18/2020 5:39:45 AM          Richardson Landry A Santoli was evaluated in Emergency Department on 03/18/2020 for the symptoms described in the history of present illness. He was evaluated in the context of the global COVID-19 pandemic, which necessitated consideration that the patient might be at risk for infection with the SARS-CoV-2 virus that causes COVID-19. Institutional protocols and algorithms that pertain to the evaluation of patients at risk for COVID-19 are in a state of rapid change based on information released by regulatory bodies including the CDC and federal and state organizations. These policies and algorithms were followed during the patient's care in the ED.      Makayia Duplessis, Delice Bison, DO 03/18/20 [phone]

## 2020-03-19 DIAGNOSIS — F17210 Nicotine dependence, cigarettes, uncomplicated: Secondary | ICD-10-CM | POA: Diagnosis present

## 2020-03-19 DIAGNOSIS — D688 Other specified coagulation defects: Secondary | ICD-10-CM | POA: Diagnosis present

## 2020-03-19 DIAGNOSIS — T8781 Dehiscence of amputation stump: Secondary | ICD-10-CM | POA: Diagnosis present

## 2020-03-19 DIAGNOSIS — D62 Acute posthemorrhagic anemia: Secondary | ICD-10-CM | POA: Diagnosis not present

## 2020-03-19 DIAGNOSIS — I471 Supraventricular tachycardia: Secondary | ICD-10-CM | POA: Diagnosis present

## 2020-03-19 DIAGNOSIS — H1131 Conjunctival hemorrhage, right eye: Secondary | ICD-10-CM | POA: Diagnosis present

## 2020-03-19 DIAGNOSIS — M1990 Unspecified osteoarthritis, unspecified site: Secondary | ICD-10-CM | POA: Diagnosis present

## 2020-03-19 DIAGNOSIS — R0789 Other chest pain: Secondary | ICD-10-CM | POA: Diagnosis present

## 2020-03-19 DIAGNOSIS — Z8572 Personal history of non-Hodgkin lymphomas: Secondary | ICD-10-CM | POA: Diagnosis not present

## 2020-03-19 DIAGNOSIS — F419 Anxiety disorder, unspecified: Secondary | ICD-10-CM | POA: Diagnosis present

## 2020-03-19 DIAGNOSIS — Z8249 Family history of ischemic heart disease and other diseases of the circulatory system: Secondary | ICD-10-CM | POA: Diagnosis not present

## 2020-03-19 DIAGNOSIS — Z86711 Personal history of pulmonary embolism: Secondary | ICD-10-CM | POA: Diagnosis not present

## 2020-03-19 DIAGNOSIS — Z7901 Long term (current) use of anticoagulants: Secondary | ICD-10-CM | POA: Diagnosis not present

## 2020-03-19 DIAGNOSIS — D72819 Decreased white blood cell count, unspecified: Secondary | ICD-10-CM | POA: Diagnosis present

## 2020-03-19 DIAGNOSIS — M86272 Subacute osteomyelitis, left ankle and foot: Secondary | ICD-10-CM | POA: Diagnosis present

## 2020-03-19 DIAGNOSIS — Z79899 Other long term (current) drug therapy: Secondary | ICD-10-CM | POA: Diagnosis not present

## 2020-03-19 DIAGNOSIS — Z91018 Allergy to other foods: Secondary | ICD-10-CM | POA: Diagnosis not present

## 2020-03-19 DIAGNOSIS — N529 Male erectile dysfunction, unspecified: Secondary | ICD-10-CM | POA: Diagnosis present

## 2020-03-19 DIAGNOSIS — I110 Hypertensive heart disease with heart failure: Secondary | ICD-10-CM | POA: Diagnosis present

## 2020-03-19 DIAGNOSIS — D509 Iron deficiency anemia, unspecified: Secondary | ICD-10-CM | POA: Diagnosis present

## 2020-03-19 DIAGNOSIS — T451X5A Adverse effect of antineoplastic and immunosuppressive drugs, initial encounter: Secondary | ICD-10-CM | POA: Diagnosis present

## 2020-03-19 DIAGNOSIS — G620 Drug-induced polyneuropathy: Secondary | ICD-10-CM | POA: Diagnosis present

## 2020-03-19 DIAGNOSIS — R079 Chest pain, unspecified: Secondary | ICD-10-CM | POA: Diagnosis present

## 2020-03-19 DIAGNOSIS — F1010 Alcohol abuse, uncomplicated: Secondary | ICD-10-CM | POA: Diagnosis present

## 2020-03-19 DIAGNOSIS — Z20822 Contact with and (suspected) exposure to covid-19: Secondary | ICD-10-CM | POA: Diagnosis present

## 2020-03-19 DIAGNOSIS — I5033 Acute on chronic diastolic (congestive) heart failure: Secondary | ICD-10-CM | POA: Diagnosis present

## 2020-03-19 DIAGNOSIS — Y835 Amputation of limb(s) as the cause of abnormal reaction of the patient, or of later complication, without mention of misadventure at the time of the procedure: Secondary | ICD-10-CM | POA: Diagnosis present

## 2020-03-19 LAB — BASIC METABOLIC PANEL
Anion gap: 14 (ref 5–15)
BUN: 13 mg/dL (ref 6–20)
CO2: 26 mmol/L (ref 22–32)
Calcium: 9.5 mg/dL (ref 8.9–10.3)
Chloride: 98 mmol/L (ref 98–111)
Creatinine, Ser: 0.93 mg/dL (ref 0.61–1.24)
GFR calc Af Amer: >60 mL/min (ref >60)
GFR calc non Af Amer: >60 mL/min (ref >60)
Glucose, Bld: 119 mg/dL — ABNORMAL HIGH (ref 70–99)
Potassium: 3.9 mmol/L (ref 3.5–5.1)
Sodium: 138 mmol/L (ref 135–145)

## 2020-03-19 LAB — CBC
HCT: 40.9 % (ref 39.0–52.0)
Hemoglobin: 13.1 g/dL (ref 13.0–17.0)
MCH: 24.7 pg — ABNORMAL LOW (ref 26.0–34.0)
MCHC: 32 g/dL (ref 30.0–36.0)
MCV: 77.2 fL — ABNORMAL LOW (ref 80.0–100.0)
Platelets: 325 10*3/uL (ref 150–400)
RBC: 5.3 MIL/uL (ref 4.22–5.81)
RDW: 14.3 % (ref 11.5–15.5)
WBC: 5.2 10*3/uL (ref 4.0–10.5)
nRBC: 0.4 % — ABNORMAL HIGH (ref 0.0–0.2)

## 2020-03-19 LAB — MAGNESIUM: Magnesium: 1.6 mg/dL — ABNORMAL LOW (ref 1.7–2.4)

## 2020-03-19 LAB — IRON AND TIBC
Iron: 203 ug/dL — ABNORMAL HIGH (ref 45–182)
Saturation Ratios: 53 % — ABNORMAL HIGH (ref 17.9–39.5)
TIBC: 381 ug/dL (ref 250–450)
UIBC: 178 ug/dL

## 2020-03-19 LAB — PHOSPHORUS: Phosphorus: 4.5 mg/dL (ref 2.5–4.6)

## 2020-03-19 LAB — D-DIMER, QUANTITATIVE: D-Dimer, Quant: 0.4 ug/mL-FEU (ref 0.00–0.50)

## 2020-03-19 LAB — PROTIME-INR
INR: 4.4 (ref 0.8–1.2)
Prothrombin Time: 40.9 seconds — ABNORMAL HIGH (ref 11.4–15.2)

## 2020-03-19 LAB — FERRITIN: Ferritin: 87 ng/mL (ref 24–336)

## 2020-03-19 MED ORDER — METOPROLOL TARTRATE 5 MG/5ML IV SOLN
2.5000 mg | Freq: Three times a day (TID) | INTRAVENOUS | Status: DC | PRN
  Administered 2020-03-19: 2.5 mg via INTRAVENOUS
  Filled 2020-03-19 (×2): qty 5

## 2020-03-19 MED ORDER — MAGNESIUM SULFATE 2 GM/50ML IV SOLN
2.0000 g | Freq: Once | INTRAVENOUS | Status: AC
  Administered 2020-03-19: 2 g via INTRAVENOUS
  Filled 2020-03-19: qty 50

## 2020-03-19 MED ORDER — NAPHAZOLINE-GLYCERIN 0.012-0.2 % OP SOLN
1.0000 [drp] | Freq: Four times a day (QID) | OPHTHALMIC | Status: DC | PRN
  Administered 2020-03-19 (×2): 2 [drp] via OPHTHALMIC
  Filled 2020-03-19 (×2): qty 15

## 2020-03-19 NOTE — TOC Initial Note (Signed)
Transition of Care [facility]) - Initial/Assessment Note    Patient Details  Name: Leonard Bentley MRN: [id number] Date of Birth: December 21, 1960  Transition of Care [facility]) CM/SW Contact:    Marilu Favre, RN Phone Number: 03/19/2020, 3:11 PM  Clinical Narrative:                 Patient from home alone. Active with [employer] and wellness. Has transportation to appointments. Gets his meds at [facility] on $4 list.   Will continue to follow.  Expected Discharge Plan: Home/Self Care Barriers to Discharge: Continued Medical Work up   Patient Goals and CMS Choice Patient states their goals for this hospitalization and ongoing recovery are:: to return to home CMS Medicare.gov Compare Post Acute Care list provided to:: Patient Choice offered to / list presented to : NA  Expected Discharge Plan and Services Expected Discharge Plan: Home/Self Care In-house Referral: Financial Counselor Discharge Planning Services: CM Consult Post Acute Care Choice: NA Living arrangements for the past 2 months: Single Family Home                   DME Agency: NA       HH Arranged: NA          Prior Living Arrangements/Services Living arrangements for the past 2 months: Single Family Home Lives with:: Spouse Patient language and need for interpreter reviewed:: Yes        Need for Family Participation in Patient Care: No (Comment)     Criminal Activity/Legal Involvement Pertinent to Current Situation/Hospitalization: No - Comment as needed  Activities of Daily Living Home Assistive Devices/Equipment: Cane (specify quad or straight) ADL Screening (condition at time of admission) Patient's cognitive ability adequate to safely complete daily activities?: Yes Is the patient deaf or have difficulty hearing?: No Does the patient have difficulty seeing, even when wearing glasses/contacts?: No Does the patient have difficulty concentrating, remembering, or making decisions?: No Patient able to  express need for assistance with ADLs?: Yes Does the patient have difficulty dressing or bathing?: No Independently performs ADLs?: Yes (appropriate for developmental age) Does the patient have difficulty walking or climbing stairs?: Yes Weakness of Legs: Left Weakness of Arms/Hands: None  Permission Sought/Granted   Permission granted to share information with : No              Emotional Assessment Appearance:: Appears stated age Attitude/Demeanor/Rapport: Engaged Affect (typically observed): Accepting Orientation: : Oriented to Self, Oriented to Place, Oriented to  Time, Oriented to Situation Alcohol / Substance Use: Not Applicable Psych Involvement: No (comment)  Admission diagnosis:  Acute pulmonary edema (HCC) [J81.0] Right foot pain [M79.671] Chest pain [R07.9] Nonspecific chest pain [R07.9] Patient Active Problem List   Diagnosis Date Noted  . Chest pain 03/19/2020  . Pulmonary edema 03/18/2020  . Microcytic hypochromic anemia 03/18/2020  . Wound dehiscence, surgical, initial encounter   . Pressure injury of skin 01/28/2020  . Osteomyelitis ([employer]) 01/27/2020  . Sepsis ([place]) 01/27/2020  . Cellulitis of left foot   . Left foot infection 01/11/2020  . Chronic combined systolic and diastolic heart failure ([place]) 03/06/2019  . Coagulation disorder ([place]) 03/06/2019  . Impacted cerumen of both ears 02/09/2019  . Osteomyelitis of great toe of left foot ([place]) 01/09/2019  . Foot ulcer, left ([place]) 12/27/2018  . Cellulitis 12/07/2018  . Bleeding from left ear 11/28/2018  . HTN (hypertension) 11/02/2018  . UTI (urinary tract infection) 11/02/2018  . Ecchymosis   . Tachyarrhythmia 08/30/2018  .  Hypomagnesemia 08/30/2018  . Recurrent pulmonary emboli ([place])   . Hematuria   . Delirium tremens ([place])   . LFTs abnormal   . Alcoholic hepatitis without ascites   . Alcohol withdrawal ([place]) 06/04/2018  . Dizziness   . Posterior tibial tendon dysfunction 02/21/2018  .  Supratherapeutic INR 11/27/2016  . Dehydration 11/27/2016  . SVT (supraventricular tachycardia) ([place])   . S/P minimally-invasive mitral valve repair 10/27/2016  . History of pulmonary embolus (PE) 04/11/2016  . Left sided numbness   . Anxiety 04/02/2016  . Panic attacks 04/02/2016  . Mitral valve prolapse   . Mitral regurgitation 01/20/2016  . Atrial tachycardia ([place]) 01/06/2016  . Murmur 01/06/2016  . Mobitz I 10/09/2015  . PAC (premature atrial contraction) 10/09/2015  . Arthritis 08/14/2015  . Neuropathy due to chemotherapeutic drug ([place]) 03/09/2013  . Leucopenia 01/31/2013  . Personal history of pulmonary embolism 12/28/2012  . Alcohol abuse 08/24/2012  . Cigarette smoker 08/24/2012  . Hemorrhoid 03/08/2012  . Anemia 12/10/2011  . History of non-Hodgkin's lymphoma 09/25/2011   PCP:  [name], NP Pharmacy:   [facility], [address] [name] [address] Phone: [phone] Fax: [phone]  [facility] [place], [place] [address] [place] [address] Phone: [phone] Fax: [phone]  [facility] [place] - [address] [place] [address] Phone: [phone] Fax: [phone]     Social Determinants of Health ([place]) Interventions    Readmission Risk Interventions Readmission Risk Prevention Plan 01/31/2020 01/16/2020 01/18/2019  Transportation Screening Complete Complete Complete  PCP or Specialist Appt within 5-7 Days Complete Complete -  Home Care Screening Complete Complete -  Medication Review (RN CM) Complete Complete -  Medication Review (RN Care Manager) - - Complete  [place]RI or [place] - - Complete  [place] - - Not Applicable  Some recent data might  be hidden

## 2020-03-19 NOTE — Progress Notes (Signed)
PROGRESS NOTE  Leonard Bentley [id number] DOB: Jan 06, 1961 DOA: 03/18/2020 PCP: Leonard Pounds, NP  HPI/Recap of past 24 hours: HPI from Leonard Bentley is a 59 y.o. male with medical history significant of HTN, chronic diastolic CHF, severe MR s/p mitral valve repair, recurrent PE, anxiety, non-Hodgkin's lymphoma, osteomyelitis of the left foot, and alcohol abuse presents with complaints of 1 week of chest tightness and shortness of breath.  He has had a dry nonproductive cough.  Associated symptoms include complaints of leg swelling.  He reports having worsening pins on needles sensation in both of his feet.  Last hospitalized in March for left foot wound dehiscence with concern for possible osteomyelitis s/p left third ray amputation by Leonard Bentley.  He had been wearing compression stockings has advised by Leonard Bentley.  Notes he had been cleared to start bearing weight on his left foot, but noted that he had not been able to get around much due to the pain.  The wound had been closed to his knowledge and he had not noticed any drainage or redness.  Denies having any significant fever or chills. In the ED, heart rates elevated up into the 140s temporarily, blood pressures elevated to 133/106, and all other vital signs maintained.  Labs significant for WBC 3.1, hemoglobin 11, troponins negative x2, BNP WNL and INR noted to be supra therapeutic.  Chest x-ray significant for mild interstitial edema.  Patient was Bentley 40 mg of Lasix IV.  TRH called to admit.    Today, patient still complains of some shortness of breath, denies any chest pain/pressure, noted to have tremors although patient states that is chronic, denies any abdominal pain, nausea/vomiting, fever/chills, diarrhea.  Noted to be significantly tachycardic with heart rate going to the 140s upon ambulation, at rest around 100.   Assessment/Plan: Principal Problem:   Pulmonary edema Active Problems:   Alcohol abuse   Cigarette smoker  Personal history of pulmonary embolism   Neuropathy due to chemotherapeutic drug (Leonard Bentley)   Anxiety   S/P minimally-invasive mitral valve repair   Supratherapeutic INR   Wound dehiscence, surgical, initial encounter   Microcytic hypochromic anemia   Chest pain   Pulmonary edema/?Acute on chronic diastolic HF/History of mitral valve repair Currently on room air BNP WNL, Troponins negative Chest x-ray significant for mild interstitial edema Echo showed EF noted to be 50-55% Strict intake and output, daily weight Continue IV diuresis for now Plan for possible cardiology consult in a.m. Telemetry  Supratherapeutic INR, R eye conjunctival hemorrhage On admission patient noted to have INR greater than 10 S/p 2.5 g of vitamin K p.o. Daily monitoring of PT/INR Holding Coumadin for now  Wound dehiscence of left foot, history of osteomyelitis Patient has history of osteomyelitis of the left foot for which he just recently had a third ray amputation by Leonard Bentley on 01/30/20 Noted mild dehiscence of the wound, but no signs of discharge CRP WNL, ESR minimally elevated  X-rays of the left foot: Findings are suspicious for osteomyelitis  Continue oxycodone as needed for pain Leonard Bentley added to treatment team, will follow up  Alcohol abuse, with possible withdrawal Patient still reports drinking a bottle of wine daily. His last drink was PTA Counseled on need for cessation of alcohol abuse CIWA protocols with scheduled Ativan  Recurrent PEs on chronic anticoagulation D-dimer negative Patient with history of recurrent PEs on anticoagulation of Coumadin Coumadin per pharmacy  Neuropathy related with chemotherapy, history of Non-Hodgkin's lymphoma  Patient with history of neuropathy thought to be related with chemotherapy drugs to treat non-Hodgkin's lymphoma Previously had been treated with systemic chemotherapy of CHOP and Rituxan last Bentley in 11/2011 Continue  gabapentin  Anxiety Continue Xanax as needed  Tobacco abuse Continue nicotine patch       Malnutrition Type:      Malnutrition Characteristics:      Nutrition Interventions:       Estimated body mass index is 25.46 kg/m as calculated from the following:   Height as of this encounter: 5' 9.5" (1.765 m).   Weight as of this encounter: 79.3 kg.     Code Status: Full  Family Communication: Discussed extensively with patient's  Disposition Plan: Status is: Inpatient  Remains inpatient appropriate because:Inpatient level of care appropriate due to severity of illness   Dispo: The patient is from: Home              Anticipated d/c is to: Home              Anticipated d/c date is: 2 days              Patient currently is not medically stable to d/c.  Still needs IV diuresis, possible alcohol withdrawal    Consultants:  Leonard Bentley tagged on the treatment team  Procedures:  None  Antimicrobials:  None  DVT prophylaxis: Coumadin on hold due to supratherapeutic INR   Objective: Vitals:   03/18/20 1717 03/18/20 2114 03/19/20 0832 03/19/20 1220  BP:  117/89 101/77 (!) 110/93  Pulse:  98 94 (!) 104  Resp: _0 Temp:  (!) 97.4 F (36.3 C) 98.3 F (36.8 C) 97.6 F (36.4 C)  TempSrc:  Oral Oral Oral  SpO2:  100% 99% 100%  Weight:      Height:        Intake/Output Summary (Last 24 hours) at 03/19/2020 1646 Last data filed at 03/19/2020 1529 Gross per 24 hour  Intake 120 ml  Output 875 ml  Net -755 ml   Filed Weights   03/18/20 1708  Weight: 79.3 kg    Exam:  General: NAD, tremor present  Cardiovascular: S1, S2 present  Respiratory:  Mild crackles noted bilaterally  Abdomen: Soft, nontender, nondistended, bowel sounds present  Musculoskeletal: No bilateral pedal edema noted, right amputation of left foot  Skin:  Wound dehiscence noted at the left foot near previous amputation  Psychiatry: Normal mood    Data  Reviewed: CBC: Recent Labs  Lab 03/18/20 0207 03/19/20 0710  WBC 3.1* 5.2  HGB 11.0* 13.1  HCT 35.0* 40.9  MCV 78.7* 77.2*  PLT 270 233   Basic Metabolic Panel: Recent Labs  Lab 03/18/20 0207 03/19/20 0710  NA 142 138  K 4.2 3.9  CL 108 98  CO2 23 26  GLUCOSE 95 119*  BUN 8 13  CREATININE 0.64 0.93  CALCIUM 8.9 9.5  MG  --  1.6*  PHOS  --  4.5   GFR: Estimated Creatinine Clearance: 85.9 mL/min (by C-G formula based on SCr of 0.93 mg/dL). Liver Function Tests: No results for input(s): AST, ALT, ALKPHOS, BILITOT, PROT, ALBUMIN in the last 168 hours. No results for input(s): LIPASE, AMYLASE in the last 168 hours. No results for input(s): AMMONIA in the last 168 hours. Coagulation Profile: Recent Labs  Lab 03/18/20 0207 03/19/20 0710  INR >10.0* 4.4*   Cardiac Enzymes: No results for input(s): CKTOTAL, CKMB, CKMBINDEX, TROPONINI in the last 168 hours.  BNP (last 3 results) No results for input(s): PROBNP in the last 8760 hours. HbA1C: No results for input(s): HGBA1C in the last 72 hours. CBG: No results for input(s): GLUCAP in the last 168 hours. Lipid Profile: No results for input(s): CHOL, HDL, LDLCALC, TRIG, CHOLHDL, LDLDIRECT in the last 72 hours. Thyroid Function Tests: No results for input(s): TSH, T4TOTAL, FREET4, T3FREE, THYROIDAB in the last 72 hours. Anemia Panel: Recent Labs    03/19/20 0710  FERRITIN 87  TIBC 381  IRON 203*   Urine analysis:    Component Value Date/Time   COLORURINE YELLOW 01/27/2020 1841   APPEARANCEUR CLEAR 01/27/2020 1841   APPEARANCEUR Clear 08/03/2019 1143   LABSPEC 1.015 01/27/2020 1841   PHURINE 6.0 01/27/2020 1841   GLUCOSEU NEGATIVE 01/27/2020 1841   HGBUR SMALL (A) 01/27/2020 1841   BILIRUBINUR NEGATIVE 01/27/2020 1841   BILIRUBINUR small (A) 08/03/2019 1144   BILIRUBINUR Negative 08/03/2019 1143   KETONESUR 5 (A) 01/27/2020 1841   PROTEINUR 30 (A) 01/27/2020 1841   UROBILINOGEN 1.0 08/03/2019 1144    UROBILINOGEN 1.0 01/25/2013 0825   NITRITE NEGATIVE 01/27/2020 1841   LEUKOCYTESUR NEGATIVE 01/27/2020 1841   Sepsis Labs: _0 (procalcitonin:4,lacticidven:4)  ) Recent Results (from the past 240 hour(s))  SARS CORONAVIRUS 2 (TAT 6-24 HRS) Nasopharyngeal Nasopharyngeal Swab     Status: None   Collection Time: 03/18/20  6:21 AM   Specimen: Nasopharyngeal Swab  Result Value Ref Range Status   SARS Coronavirus 2 NEGATIVE NEGATIVE Final    Comment: (NOTE) SARS-CoV-2 target nucleic acids are NOT DETECTED. The SARS-CoV-2 RNA is generally detectable in upper and lower respiratory specimens during the acute phase of infection. Negative results do not preclude SARS-CoV-2 infection, do not rule out co-infections with other pathogens, and should not be used as the sole basis for treatment or other patient management decisions. Negative results must be combined with clinical observations, patient history, and epidemiological information. The expected result is Negative. Fact Sheet for Patients: SugarRoll.be Fact Sheet for Healthcare Providers: https://www.woods-mathews.com/ This test is not yet approved or cleared by the [place] FDA and  has been authorized for detection and/or diagnosis of SARS-CoV-2 by FDA under an Emergency Use Authorization (EUA). This EUA will remain  in effect (meaning this test can be used) for the duration of the COVID-19 declaration under Section 56 4(b)(1) of the Act, 21 U.S.C. section 360bbb-3(b)(1), unless the authorization is terminated or revoked sooner. Performed at [facility], [place] [address], [place], [place] [address]       Studies: No results found.  Scheduled Meds: . docusate sodium  100 mg Oral BID  . folic acid  1 mg Oral Daily  . furosemide  40 mg Intravenous BID  . gabapentin  100 mg Oral TID  . LORazepam  0-4 mg Intravenous Q6H   Followed by  . [START ON 03/20/2020] LORazepam   0-4 mg Intravenous Q12H  . metoprolol succinate  50 mg Oral Daily  . multivitamin with minerals  1 tablet Oral Daily  . nicotine  21 mg Transdermal Daily  . sodium chloride flush  3 mL Intravenous Once  . sodium chloride flush  3 mL Intravenous Q12H  . thiamine  100 mg Oral Daily   Or  . thiamine  100 mg Intravenous Daily  . Warfarin - Pharmacist Dosing Inpatient   Does not apply q1600    Continuous Infusions: . sodium chloride       LOS: 0 days  Alma Friendly, MD [facility]  If 7PM-7AM, please contact night-coverage [email or web address] 03/19/2020, 4:46 PM

## 2020-03-19 NOTE — Progress Notes (Signed)
Md in room to evaluate pt. 12 lead EKG ordered and performed per md order. Md reported to notify if HR >115. Pt resting, no complaints voiced, will cont to monitor.

## 2020-03-19 NOTE — Progress Notes (Signed)
Call from tele with report that pt went from SR to Afib at 2222. Md paged, will cont to monitor.

## 2020-03-19 NOTE — Progress Notes (Signed)
ANTICOAGULATION CONSULT NOTE - Initial Consult  Pharmacy Consult for warfarin Indication: hx VTE, MV repair  Vital Signs: Temp: 97.6 F (36.4 C) (05/12 1220) Temp Source: Oral (05/12 1220) BP: 110/93 (05/12 1220) Pulse Rate: 104 (05/12 1220)  Labs: Recent Labs    03/18/20 0207 03/18/20 0409 03/19/20 0710  HGB 11.0*  --  13.1  HCT 35.0*  --  40.9  PLT 270  --  325  LABPROT >90.0*  --  40.9*  INR >10.0*  --  4.4*  CREATININE 0.64  --  0.93  TROPONINIHS 7 8  --     Estimated Creatinine Clearance: 85.9 mL/min (by C-G formula based on SCr of 0.93 mg/dL).   Medical History: Past Medical History:  Diagnosis Date  . Acute pulmonary embolism ([place]) 04/11/2016  . Anxiety   . Arthritis   . Atrial tachycardia ([place]) 01/06/2016  . Depression   . DTs (delirium tremens) ([place])   . Dyspnea   . ED (erectile dysfunction)   . ETOH abuse   . Hypertension   . Lymphoma, non Hodgkin's 09/25/2011   Stage 2  . Mitral regurgitation 01/20/2016  . Mobitz I 10/09/2015  . Murmur 01/06/2016  . Occasional tremors   . PAC (premature atrial contraction) 10/09/2015  . S/P minimally-invasive mitral valve repair 10/27/2016   Complex valvuloplasty including artificial Gore-tex neochord placement x6 and 34 mm Edwards Physio II ring annuloplasty via right mini thoracotomy approach   Assessment: Pharmacy to manage warfarin for this 24 yom who presented to the ED with CP and SOB. He is on chronic warfarin for mitral valve repair, recurrent PE. Yesterday's INR >10, warfarin was held and Vit K 2.5mg  po x 1 was administered. No bleeding noted. Hgb and PLT are WNL.  Warfarin PTA regimen: 7.5mg  daily  Today's INR remains supratherapeutic at 4.4.  Goal of Therapy:  INR 2-3 Monitor platelets by anticoagulation protocol: Yes   Plan:  No warfarin today Daily INR  Efraim Kaufmann, PharmD, BCPS 03/19/2020,12:54 PM

## 2020-03-20 ENCOUNTER — Other Ambulatory Visit: Payer: Self-pay | Admitting: Physician Assistant

## 2020-03-20 LAB — CBC WITH DIFFERENTIAL/PLATELET
Abs Immature Granulocytes: 0.01 10*3/uL (ref 0.00–0.07)
Basophils Absolute: 0.1 10*3/uL (ref 0.0–0.1)
Basophils Relative: 1 %
Eosinophils Absolute: 0.5 10*3/uL (ref 0.0–0.5)
Eosinophils Relative: 9 %
HCT: 36 % — ABNORMAL LOW (ref 39.0–52.0)
Hemoglobin: 11.8 g/dL — ABNORMAL LOW (ref 13.0–17.0)
Immature Granulocytes: 0 %
Lymphocytes Relative: 14 %
Lymphs Abs: 0.7 10*3/uL (ref 0.7–4.0)
MCH: 25.3 pg — ABNORMAL LOW (ref 26.0–34.0)
MCHC: 32.8 g/dL (ref 30.0–36.0)
MCV: 77.1 fL — ABNORMAL LOW (ref 80.0–100.0)
Monocytes Absolute: 0.6 10*3/uL (ref 0.1–1.0)
Monocytes Relative: 12 %
Neutro Abs: 3.1 10*3/uL (ref 1.7–7.7)
Neutrophils Relative %: 64 %
Platelets: 278 10*3/uL (ref 150–400)
RBC: 4.67 MIL/uL (ref 4.22–5.81)
RDW: 14 % (ref 11.5–15.5)
WBC: 4.8 10*3/uL (ref 4.0–10.5)
nRBC: 0.6 % — ABNORMAL HIGH (ref 0.0–0.2)

## 2020-03-20 LAB — BASIC METABOLIC PANEL
Anion gap: 12 (ref 5–15)
BUN: 22 mg/dL — ABNORMAL HIGH (ref 6–20)
CO2: 27 mmol/L (ref 22–32)
Calcium: 9 mg/dL (ref 8.9–10.3)
Chloride: 98 mmol/L (ref 98–111)
Creatinine, Ser: 1.02 mg/dL (ref 0.61–1.24)
GFR calc Af Amer: >60 mL/min (ref >60)
GFR calc non Af Amer: >60 mL/min (ref >60)
Glucose, Bld: 125 mg/dL — ABNORMAL HIGH (ref 70–99)
Potassium: 3.5 mmol/L (ref 3.5–5.1)
Sodium: 137 mmol/L (ref 135–145)

## 2020-03-20 LAB — PROTIME-INR
INR: 3.3 — ABNORMAL HIGH (ref 0.8–1.2)
Prothrombin Time: 32.7 seconds — ABNORMAL HIGH (ref 11.4–15.2)

## 2020-03-20 LAB — SURGICAL PCR SCREEN
MRSA, PCR: POSITIVE — AB
Staphylococcus aureus: POSITIVE — AB

## 2020-03-20 MED ORDER — WARFARIN SODIUM 5 MG PO TABS
5.0000 mg | ORAL_TABLET | Freq: Once | ORAL | Status: AC
  Administered 2020-03-20: 5 mg via ORAL
  Filled 2020-03-20: qty 1

## 2020-03-20 MED ORDER — FUROSEMIDE 10 MG/ML IJ SOLN
40.0000 mg | Freq: Every day | INTRAMUSCULAR | Status: DC
  Administered 2020-03-21 – 2020-03-22 (×2): 40 mg via INTRAVENOUS
  Filled 2020-03-20 (×2): qty 4

## 2020-03-20 NOTE — Progress Notes (Signed)
PROGRESS NOTE  Leonard Bentley FTD:[id number] DOB: 1960-12-15 DOA: 03/18/2020 PCP: [name], NP  HPI/Recap of past 24 hours: HPI from Dr [name] A Leonard Bentley is a 59 y.o. male with medical history significant of HTN, chronic diastolic CHF, severe MR s/p mitral valve repair, recurrent PE, anxiety, non-Hodgkin's lymphoma, osteomyelitis of the left foot, and alcohol abuse presents with complaints of 1 week of chest tightness and shortness of breath.  He has had a dry nonproductive cough.  Associated symptoms include complaints of leg swelling.  He reports having worsening pins on needles sensation in both of his feet.  Last hospitalized in March for left foot wound dehiscence with concern for possible osteomyelitis s/p left third ray amputation by Dr. [name].  He had been wearing compression stockings has advised by Dr. [name].  Notes he had been cleared to start bearing weight on his left foot, but noted that he had not been able to get around much due to the pain.  The wound had been closed to his knowledge and he had not noticed any drainage or redness.  Denies having any significant fever or chills. In the ED, heart rates elevated up into the 140s temporarily, blood pressures elevated to 133/106, and all other vital signs maintained.  Labs significant for WBC 3.1, hemoglobin 11, troponins negative x2, BNP WNL and INR noted to be supra therapeutic.  Chest x-ray significant for mild interstitial edema.  Patient was given 40 mg of Lasix IV.  TRH called to admit.    Today, patient denies any new complaints, denies any chest pain, worsening shortness of breath, cough, abdominal pain, nausea/vomiting, fever/chills.   Assessment/Plan: Principal Problem:   Pulmonary edema Active Problems:   Alcohol abuse   Cigarette smoker   Personal history of pulmonary embolism   Neuropathy due to chemotherapeutic drug ([place])   Anxiety   S/P minimally-invasive mitral valve repair   Supratherapeutic INR   Wound  dehiscence, surgical, initial encounter   Microcytic hypochromic anemia   Chest pain   Pulmonary edema/?Acute on chronic diastolic HF/History of mitral valve repair Currently on room air BNP WNL, Troponins negative Chest x-ray significant for mild interstitial edema Echo showed EF noted to be 50-55% Strict intake and output, daily weight Continue IV diuresis for now Follow-up with cardiology as an outpatient Telemetry  Supratherapeutic INR, R eye conjunctival hemorrhage On admission patient noted to have INR greater than 10 S/p 2.5 g of vitamin K p.o. Daily monitoring of PT/INR Restart Coumadin on 03/20/2020  Wound dehiscence of left foot, history of osteomyelitis Patient has history of osteomyelitis of the left foot for which he just recently had a third ray amputation by Dr. [name] on 01/30/20 Noted mild dehiscence of the wound, but no signs of discharge CRP WNL, ESR minimally elevated  X-rays of the left foot: Findings are suspicious for osteomyelitis  Continue oxycodone as needed for pain Discussed with Dr. [name] on 03/20/2020, will see patient Wound care on board, appreciate recs  Alcohol abuse, with possible withdrawal Patient still reports drinking a bottle of wine daily. His last drink was PTA Counseled on need for cessation of alcohol abuse CIWA protocols with scheduled Ativan  Recurrent PEs on chronic anticoagulation D-dimer negative Patient with history of recurrent PEs on anticoagulation of Coumadin Coumadin per pharmacy  Neuropathy related with chemotherapy, history of Non-Hodgkin's lymphoma Patient with history of neuropathy thought to be related with chemotherapy drugs to treat non-Hodgkin's lymphoma Previously had been treated with systemic chemotherapy  of CHOP and Rituxan last given in 11/2011 Continue gabapentin  Anxiety Continue Xanax as needed  Tobacco abuse Continue nicotine patch       Malnutrition Type:      Malnutrition  Characteristics:      Nutrition Interventions:       Estimated body mass index is 26.97 kg/m as calculated from the following:   Height as of this encounter: 5' 9.5" (1.765 m).   Weight as of this encounter: 84.1 kg.     Code Status: Full  Family Communication: Discussed extensively with patient  Disposition Plan: Status is: Inpatient  Remains inpatient appropriate because:Inpatient level of care appropriate due to severity of illness   Dispo: The patient is from: Home              Anticipated d/c is to: Home              Anticipated d/c date is: 2 days              Patient currently is not medically stable to d/c.  Still needs IV diuresis, possible alcohol withdrawal    Consultants:  Dr. [name]   Procedures:  None  Antimicrobials:  None  DVT prophylaxis: Coumadin on hold due to supratherapeutic INR   Objective: Vitals:   03/20/20 0611 03/20/20 0731 03/20/20 0834 03/20/20 1151  BP: 112/81 102/81 123/69 (!) 121/97  Pulse: 98 (!) 50 60 (!) 102  Resp:  '16 17 18  '$ Temp:  98.2 F (36.8 C)  98.5 F (36.9 C)  TempSrc:  Oral  Oral  SpO2:  100% 100% 95%  Weight:      Height:        Intake/Output Summary (Last 24 hours) at 03/20/2020 1249 Last data filed at 03/20/2020 1007 Gross per 24 hour  Intake 490 ml  Output --  Net 490 ml   Filed Weights   03/18/20 1708 03/20/20 0535  Weight: 79.3 kg 84.1 kg    Exam:  General: NAD, tremor present  Cardiovascular: S1, S2 present  Respiratory:  Mild crackles noted bilaterally  Abdomen: Soft, nontender, nondistended, bowel sounds present  Musculoskeletal: No bilateral pedal edema noted, ray amputation of left foot  Skin:  Wound dehiscence noted at the left foot near previous amputation  Psychiatry: Normal mood    Data Reviewed: CBC: Recent Labs  Lab 03/18/20 0207 03/19/20 0710 03/20/20 0246  WBC 3.1* 5.2 4.8  NEUTROABS  --   --  3.1  HGB 11.0* 13.1 11.8*  HCT 35.0* 40.9 36.0*  MCV 78.7* 77.2*  77.1*  PLT 270 325 767   Basic Metabolic Panel: Recent Labs  Lab 03/18/20 0207 03/19/20 0710 03/20/20 0246  NA 142 138 137  K 4.2 3.9 3.5  CL 108 98 98  CO2 '23 26 27  '$ GLUCOSE 95 119* 125*  BUN 8 13 22*  CREATININE 0.64 0.93 1.02  CALCIUM 8.9 9.5 9.0  MG  --  1.6*  --   PHOS  --  4.5  --    GFR: Estimated Creatinine Clearance: 78.3 mL/min (by C-G formula based on SCr of 1.02 mg/dL). Liver Function Tests: No results for input(s): AST, ALT, ALKPHOS, BILITOT, PROT, ALBUMIN in the last 168 hours. No results for input(s): LIPASE, AMYLASE in the last 168 hours. No results for input(s): AMMONIA in the last 168 hours. Coagulation Profile: Recent Labs  Lab 03/18/20 0207 03/19/20 0710 03/20/20 0246  INR >10.0* 4.4* 3.3*   Cardiac Enzymes: No results for input(s): CKTOTAL,  CKMB, CKMBINDEX, TROPONINI in the last 168 hours. BNP (last 3 results) No results for input(s): PROBNP in the last 8760 hours. HbA1C: No results for input(s): HGBA1C in the last 72 hours. CBG: No results for input(s): GLUCAP in the last 168 hours. Lipid Profile: No results for input(s): CHOL, HDL, LDLCALC, TRIG, CHOLHDL, LDLDIRECT in the last 72 hours. Thyroid Function Tests: No results for input(s): TSH, T4TOTAL, FREET4, T3FREE, THYROIDAB in the last 72 hours. Anemia Panel: Recent Labs    03/19/20 0710  FERRITIN 87  TIBC 381  IRON 203*   Urine analysis:    Component Value Date/Time   COLORURINE YELLOW 01/27/2020 1841   APPEARANCEUR CLEAR 01/27/2020 1841   APPEARANCEUR Clear 08/03/2019 1143   LABSPEC 1.015 01/27/2020 1841   PHURINE 6.0 01/27/2020 1841   GLUCOSEU NEGATIVE 01/27/2020 1841   HGBUR SMALL (A) 01/27/2020 1841   BILIRUBINUR NEGATIVE 01/27/2020 1841   BILIRUBINUR small (A) 08/03/2019 1144   BILIRUBINUR Negative 08/03/2019 1143   KETONESUR 5 (A) 01/27/2020 1841   PROTEINUR 30 (A) 01/27/2020 1841   UROBILINOGEN 1.0 08/03/2019 1144   UROBILINOGEN 1.0 01/25/2013 0825   NITRITE  NEGATIVE 01/27/2020 1841   LEUKOCYTESUR NEGATIVE 01/27/2020 1841   Sepsis Labs: '@LABRCNTIP'$ (procalcitonin:4,lacticidven:4)  ) Recent Results (from the past 240 hour(s))  SARS CORONAVIRUS 2 (TAT 6-24 HRS) Nasopharyngeal Nasopharyngeal Swab     Status: None   Collection Time: 03/18/20  6:21 AM   Specimen: Nasopharyngeal Swab  Result Value Ref Range Status   SARS Coronavirus 2 NEGATIVE NEGATIVE Final    Comment: (NOTE) SARS-CoV-2 target nucleic acids are NOT DETECTED. The SARS-CoV-2 RNA is generally detectable in upper and lower respiratory specimens during the acute phase of infection. Negative results do not preclude SARS-CoV-2 infection, do not rule out co-infections with other pathogens, and should not be used as the sole basis for treatment or other patient management decisions. Negative results must be combined with clinical observations, patient history, and epidemiological information. The expected result is Negative. Fact Sheet for Patients: SugarRoll.be Fact Sheet for Healthcare Providers: https://www.woods-mathews.com/ This test is not yet approved or cleared by the [place] FDA and  has been authorized for detection and/or diagnosis of SARS-CoV-2 by FDA under an Emergency Use Authorization (EUA). This EUA will remain  in effect (meaning this test can be used) for the duration of the COVID-19 declaration under Section 56 4(b)(1) of the Act, 21 U.S.C. section 360bbb-3(b)(1), unless the authorization is terminated or revoked sooner. Performed at [facility], [place] [address], [place], [place] [address]       Studies: No results found.  Scheduled Meds: . docusate sodium  100 mg Oral BID  . folic acid  1 mg Oral Daily  . [START ON 03/21/2020] furosemide  40 mg Intravenous Daily  . gabapentin  100 mg Oral TID  . LORazepam  0-4 mg Intravenous Q12H  . metoprolol succinate  50 mg Oral Daily  . multivitamin with minerals   1 tablet Oral Daily  . nicotine  21 mg Transdermal Daily  . sodium chloride flush  3 mL Intravenous Once  . sodium chloride flush  3 mL Intravenous Q12H  . thiamine  100 mg Oral Daily   Or  . thiamine  100 mg Intravenous Daily  . warfarin  5 mg Oral ONCE-1600  . Warfarin - Pharmacist Dosing Inpatient   Does not apply q1600    Continuous Infusions: . sodium chloride       LOS: 1 [place]  Alma Friendly, MD [facility]  If 7PM-7AM, please contact night-coverage [email or web address] 03/20/2020, 12:49 PM

## 2020-03-20 NOTE — Consult Note (Signed)
ORTHOPAEDIC CONSULTATION  REQUESTING PHYSICIAN: Alma Friendly, MD  Chief Complaint: Increased swelling and redness left transmetatarsal amputation with wound dehiscence.  HPI: Leonard Bentley is a 59 y.o. male who presents with anxiety as well as heart irregularity with progressive swelling redness and wound dehiscence left transmetatarsal amputation.  Patient has been full weightbearing.  Past Medical History:  Diagnosis Date  . Acute pulmonary embolism ([place]) 04/11/2016  . Anxiety   . Arthritis   . Atrial tachycardia ([place]) 01/06/2016  . Depression   . DTs (delirium tremens) ([place])   . Dyspnea   . ED (erectile dysfunction)   . ETOH abuse   . Hypertension   . Lymphoma, non Hodgkin's 09/25/2011   Stage 2  . Mitral regurgitation 01/20/2016  . Mobitz I 10/09/2015  . Murmur 01/06/2016  . Occasional tremors   . PAC (premature atrial contraction) 10/09/2015  . S/P minimally-invasive mitral valve repair 10/27/2016   Complex valvuloplasty including artificial Gore-tex neochord placement x6 and 34 mm Edwards Physio II ring annuloplasty via right mini thoracotomy approach   Past Surgical History:  Procedure Laterality Date  . AMPUTATION Left 01/12/2019   Procedure: LEFT GREAT TOE AMPUTATION;  Surgeon: [name], MD;  Location: [place];  Service: Orthopedics;  Laterality: Left;  . AMPUTATION Left 01/16/2020   Procedure: LEFT 1ST METATARSAL AND SECOND TOE AMPUTATION;  Surgeon: [name], MD;  Location: [place];  Service: Orthopedics;  Laterality: Left;  . AMPUTATION Left 01/30/2020   Procedure: LEFT FOOT 3RD RAY AMPUTATION;  Surgeon: [name], MD;  Location: [name];  Service: Orthopedics;  Laterality: Left;  . CARDIAC CATHETERIZATION N/A 08/13/2016   Procedure: Right/Left Heart Cath and Coronary Angiography;  Surgeon: [name], MD;  Location: [facility];  Service: Cardiovascular;  Laterality: N/A;  . MITRAL VALVE REPAIR Right 10/27/2016   Procedure: MINIMALLY  INVASIVE MITRAL VALVE REPAIR (MVR) USING 34 PHYSIO II ANNULOPLASTY RING;  Surgeon: [name], MD;  Location: [place];  Service: Open Heart Surgery;  Laterality: Right;  . SKIN BIOPSY Right 04/04/2018   right mid medial anterior tibial shave  see report in chart  . TEE WITHOUT CARDIOVERSION N/A 02/11/2016   Procedure: TRANSESOPHAGEAL ECHOCARDIOGRAM (TEE);  Surgeon: [name], MD;  Location: [place];  Service: Cardiovascular;  Laterality: N/A;  . TEE WITHOUT CARDIOVERSION N/A 10/27/2016   Procedure: TRANSESOPHAGEAL ECHOCARDIOGRAM (TEE);  Surgeon: [name], MD;  Location: [place];  Service: Open Heart Surgery;  Laterality: N/A;  . TRIGGER FINGER RELEASE Bilateral    Social History   Socioeconomic History  . Marital status: Single    Spouse name: Not on file  . Number of children: Not on file  . Years of education: Not on file  . Highest education level: Not on file  Occupational History  . Occupation: unemployed  Tobacco Use  . Smoking status: Current Every [name] Smoker    Packs/[place]: 1.00    Years: 40.00    Pack years: 40.00    Types: Cigarettes, E-cigarettes  . Smokeless tobacco: Former Systems developer    Quit date: 1985  . Tobacco comment: vapes all [place]  Substance and Sexual Activity  . Alcohol use: Yes    Alcohol/week: 0.0 standard drinks    Comment: 1-2 bottles of red wine a [place]  . Drug use: No  . Sexual activity: Not Currently  Other Topics Concern  . Not on file  Social History Narrative   Epworth sleepiness  scale as of 10/09/15 a 1   Social Determinants of Health   Financial Resource Strain:   . Difficulty of Paying Living Expenses:   Food Insecurity:   . Worried About Charity fundraiser in the Last Year:   . Arboriculturist in the Last Year:   Transportation Needs:   . Film/video editor (Medical):   Marland Kitchen Lack of Transportation (Non-Medical):   Physical Activity:   . Days of Exercise per Week:   . Minutes of Exercise per Session:   Stress:   . Feeling  of Stress :   Social Connections:   . Frequency of Communication with Friends and Family:   . Frequency of Social Gatherings with Friends and Family:   . Attends Religious Services:   . Active Member of Clubs or Organizations:   . Attends Archivist Meetings:   Marland Kitchen Marital Status:    Family History  Problem Relation Age of Onset  . Cancer Mother        BREAST(BONE)  . Cancer Father        PANCREATIC  . Hypertension Maternal Grandmother   . Stroke Maternal Aunt   . Heart attack Neg Hx    - negative except otherwise stated in the family history section Allergies  Allergen Reactions  . Other Other (See Comments)    "Cactus" caused blisters on tongue (if prepared as food)   Prior to Admission medications   Medication Sig Start Date End Date Taking? Authorizing Provider  acetaminophen (TYLENOL) 325 MG tablet Take 2 tablets (650 mg total) by mouth every 6 (six) hours as needed for mild pain (or Fever >/= 101). 02/25/20  Yes Ghimire, Henreitta Leber, MD  ALPRAZolam Duanne Moron) 0.25 MG tablet Take 1 tablet (0.25 mg total) by mouth 2 (two) times daily as needed for anxiety. 03/07/20  Yes [name], MD  docusate sodium (COLACE) 100 MG capsule Take 1 capsule (100 mg total) by mouth 2 (two) times daily. 02/25/20 03/26/20 Yes Ghimire, Henreitta Leber, MD  gabapentin (NEURONTIN) 100 MG capsule Take 1 capsule (100 mg total) by mouth 3 (three) times daily. 02/25/20  Yes Ghimire, Henreitta Leber, MD  metoprolol succinate (TOPROL-XL) 50 MG 24 hr tablet Take 1 tablet (50 mg total) by mouth daily. Take with or immediately following a meal. 02/25/20  Yes Ghimire, Henreitta Leber, MD  oxyCODONE (OXY IR/ROXICODONE) 5 MG immediate release tablet Take 1 tablet (5 mg total) by mouth every 6 (six) hours as needed for moderate pain (pain score 4-6). 03/07/20  Yes [name], MD  warfarin (COUMADIN) 7.5 MG tablet Take 1 tablet by mouth daily. Follow-up with Coumadin Clinic for INR and dosing instructions 02/25/20  Yes Ghimire,  Henreitta Leber, MD  nicotine (NICODERM CQ - DOSED IN MG/24 HOURS) 21 mg/24hr patch Place 1 patch (21 mg total) onto the skin daily. 02/25/20   Jonetta Osgood, MD   ECHOCARDIOGRAM COMPLETE  Result Date: 03/18/2020    ECHOCARDIOGRAM REPORT   Patient Name:   TEE NORTHUP Fees Date of Exam: 03/18/2020 Medical Rec #:  [id number]   Height:       69.0 in Accession #:    [id number]  Weight:       193.0 lb Date of Birth:  09-17-1961   BSA:          2.035 m Patient Age:    59 years    BP:           129/93 mmHg  Patient Gender: M           HR:           100 bpm. Exam Location:  Inpatient Procedure: 2D Echo, Cardiac Doppler and Color Doppler Indications:    CHF-Acute Diastolic [id number] / [id number]  History:        Patient has prior history of Echocardiogram examinations, most                 recent 08/31/2018. CHF, Mitral Valve Disease; Risk                 Factors:Current Smoker and Hypertension. Pulmonary embolism.                 SVT.                  Mitral Valve: prosthetic annuloplasty ring valve is present in                 the mitral position. Procedure Date: 10/27/2016.  Sonographer:    Vickie Epley RDCS Referring Phys: [id number] RONDELL A SMITH IMPRESSIONS  1. Left ventricular ejection fraction, by estimation, is 50 to 55%. The left ventricle has low normal function. The left ventricle has no regional wall motion abnormalities. Left ventricular diastolic parameters are consistent with Grade I diastolic dysfunction (impaired relaxation).  2. Right ventricular systolic function is normal. The right ventricular size is normal. Tricuspid regurgitation signal is inadequate for assessing PA pressure.  3. Left atrial size was mildly dilated.  4. The mitral valve has been repaired/replaced. Trivial mitral valve regurgitation. No evidence of mitral stenosis. The mean mitral valve gradient is 1.9 mmHg with average heart rate of 97 bpm. There is a prosthetic annuloplasty ring present in the mitral position. Procedure Date: 10/27/2016.  5.  The aortic valve is normal in structure. Aortic valve regurgitation is not visualized.  6. The inferior vena cava is normal in size with greater than 50% respiratory variability, suggesting right atrial pressure of 3 mmHg. Comparison(s): No significant change from prior study. Prior images reviewed side by side. FINDINGS  Left Ventricle: Left ventricular ejection fraction, by estimation, is 50 to 55%. The left ventricle has low normal function. The left ventricle has no regional wall motion abnormalities. The left ventricular internal cavity size was normal in size. There is no left ventricular hypertrophy. Abnormal (paradoxical) septal motion consistent with post-operative status. Left ventricular diastolic function could not be evaluated due to mitral valve repair. Left ventricular diastolic parameters are consistent with Grade I diastolic dysfunction (impaired relaxation). Normal left ventricular filling pressure. Right Ventricle: The right ventricular size is normal. No increase in right ventricular wall thickness. Right ventricular systolic function is normal. Tricuspid regurgitation signal is inadequate for assessing PA pressure. Left Atrium: Left atrial size was mildly dilated. Right Atrium: Right atrial size was normal in size. Pericardium: There is no evidence of pericardial effusion. Mitral Valve: Neochordae and annuloplasty ring are seen. The mitral valve has been repaired/replaced. Trivial mitral valve regurgitation. There is a prosthetic annuloplasty ring present in the mitral position. Procedure Date: 10/27/2016. No evidence of mitral valve stenosis. The mean mitral valve gradient is 1.9 mmHg with average heart rate of 97 bpm. Tricuspid Valve: The tricuspid valve is normal in structure. Tricuspid valve regurgitation is not demonstrated. Aortic Valve: The aortic valve is normal in structure. Aortic valve regurgitation is not visualized. Pulmonic Valve: The pulmonic valve was normal in structure.  Pulmonic valve regurgitation is trivial. Aorta: The aortic  root and ascending aorta are structurally normal, with no evidence of dilitation. Venous: The inferior vena cava is normal in size with greater than 50% respiratory variability, suggesting right atrial pressure of 3 mmHg. IAS/Shunts: No atrial level shunt detected by color flow Doppler.  LEFT VENTRICLE PLAX 2D LVIDd:         5.00 cm  Diastology LVIDs:         3.50 cm  LV e' lateral:   8.03 cm/s LV PW:         1.00 cm  LV E/e' lateral: 7.4 LV IVS:        1.00 cm  LV e' medial:    5.27 cm/s LVOT diam:     2.70 cm  LV E/e' medial:  11.3 LV SV:         62 LV SV Index:   30 LVOT Area:     5.73 cm  RIGHT VENTRICLE RV S prime:     7.23 cm/s TAPSE (M-mode): 1.3 cm LEFT ATRIUM             Index       RIGHT ATRIUM           Index LA diam:        4.00 cm 1.97 cm/m  RA Area:     12.50 cm LA Vol (A2C):   53.7 ml 26.39 ml/m RA Volume:   20.10 ml  9.88 ml/m LA Vol (A4C):   49.4 ml 24.28 ml/m LA Biplane Vol: 52.1 ml 25.60 ml/m  AORTIC VALVE LVOT Vmax:   67.20 cm/s LVOT Vmean:  46.500 cm/s LVOT VTI:    0.108 m  AORTA Ao Root diam: 3.90 cm Ao Asc diam:  3.70 cm MITRAL VALVE MV Area (PHT): 3.50 cm    SHUNTS MV Mean grad:  1.9 mmHg    Systemic VTI:  0.11 m MV Decel Time: 217 msec    Systemic Diam: 2.70 cm MV E velocity: 59.70 cm/s MV A velocity: 97.50 cm/s MV E/A ratio:  0.61 Mihai Croitoru MD Electronically signed by Sanda Klein MD Signature Date/Time: 03/18/2020/3:07:19 PM    Final    - pertinent xrays, CT, MRI studies were reviewed and independently interpreted  Positive ROS: All other systems have been reviewed and were otherwise negative with the exception of those mentioned in the HPI and as above.  Physical Exam: General: Alert, no acute distress Psychiatric: Patient is competent for consent with normal mood and affect Lymphatic: No axillary or cervical lymphadenopathy Cardiovascular: No pedal edema Respiratory: No cyanosis, no use of accessory  musculature GI: No organomegaly, abdomen is soft and non-tender    Images:  @ENCIMAGES @  Labs:  Lab Results  Component Value Date   HGBA1C 5.8 (H) 02/05/2020   HGBA1C 5.2 10/25/2016   HGBA1C 6.1 (H) 04/03/2016   ESRSEDRATE 17 (H) 03/18/2020   ESRSEDRATE 14 01/28/2020   ESRSEDRATE 7 01/12/2020   CRP 0.5 03/18/2020   CRP 1.2 (H) 01/28/2020   CRP 0.8 01/12/2020   LABURIC 5.2 12/09/2018   LABURIC 7.4 08/18/2015   LABURIC 4.1 12/01/2011   REPTSTATUS 02/01/2020 FINAL 01/27/2020   REPTSTATUS 02/01/2020 FINAL 01/27/2020   CULT  01/27/2020    NO GROWTH 5 DAYS Performed at [facility], [place] [address], [place], [place] [address]    CULT  01/27/2020    NO GROWTH 5 DAYS Performed at [facility] [address] [place], [place] [address]     Lab Results  Component Value Date   ALBUMIN 2.7 (L) 02/02/2020   ALBUMIN 2.9 (L) 01/31/2020   ALBUMIN 2.8 (L) 01/31/2020   LABURIC 5.2 12/09/2018   LABURIC 7.4 08/18/2015   LABURIC 4.1 12/01/2011    Neurologic: Patient does not have protective sensation bilateral lower extremities.   MUSCULOSKELETAL:   Skin: Examination patient has increased redness swelling and wound dehiscence.  Radiographs shows osteomyelitis of the first second and third metatarsals.  Assessment: Assessment: Osteomyelitis with wound breakdown left foot  Plan: Plan: We will plan for revision left transmetatarsal amputation.  Discussed the importance of smoking cessation patient is currently smoking daily.  Discussed the importance of decreasing alcohol intake he states he is drinking a half a bottle of wine a [place].  Will post for surgery tomorrow Friday afternoon.  Thank you for the consult and the opportunity to see Mr. [place], MD [facility] [phone] 12:43 PM

## 2020-03-20 NOTE — Evaluation (Signed)
Physical Therapy Evaluation Patient Details Name: Leonard Bentley MRN: [id number] DOB: [birth year] Today's Date: 03/20/2020   History of Present Illness  Pt is a 59 y/o male admitted secondary to chest tightness. Pt found to have pulmonary edema with history of diastolic congestive heart.  PMH including but not limited to HTN, chronic diastolic CHF, severe MR s/p mitral valve repair, recurrent PE, anxiety, non-Hodgkin's lymphoma, osteomyelitis of the left foot, and alcohol abuse.    Clinical Impression  Pt presented sitting upright at EOB, awake and willing to participate in therapy session. Prior to admission, pt reported that he was ambulating with use of a cane and was independent with ADLs. Pt stated that after discharging from the hospital on 4/19, pt was staying with his brother and sister-in-law; however, was asked to leave after several days and has since been staying in a hotel. He stated that his home "still has problems" but does not elaborate further. At the time of evaluation, pt overall at a min guard to supervision level for all mobility with use of RW for gait training. Pt asymptomatic throughout and able to mostly WB through L heel only throughout. PT will continue to f/u with pt acutely to progress mobility as tolerated and to ensure a safe d/c home.    Follow Up Recommendations No PT follow up    Equipment Recommendations  None recommended by PT    Recommendations for Other Services       Precautions / Restrictions Precautions Precautions: Fall Restrictions Weight Bearing Restrictions: Yes LLE Weight Bearing: Weight bearing as tolerated Other Position/Activity Restrictions: WBAT THROUGH HEEL      Mobility  Bed Mobility               General bed mobility comments: pt seated EOB upon arrival  Transfers Overall transfer level: Needs assistance Equipment used: Rolling walker (2 wheeled) Transfers: Sit to/from Stand Sit to Stand: Supervision         General  transfer comment: good technique utilized, supervision for safety  Ambulation/Gait Ambulation/Gait assistance: Min Gaffer (Feet): 500 Feet Assistive device: Rolling walker (2 wheeled) Gait Pattern/deviations: Step-to pattern;Decreased step length - right;Decreased step length - left;Decreased stance time - left;Decreased stride length;Decreased weight shift to left Gait velocity: decreased   General Gait Details: cueing to maintain [place] through heel only on L LE; pt overall very steady with use of RW; no LOB or need for physical assistance  Stairs            Wheelchair Mobility    Modified Rankin (Stroke Patients Only)       Balance Overall balance assessment: Needs assistance Sitting-balance support: Feet supported Sitting balance-Leahy Scale: Good     Standing balance support: During functional activity Standing balance-Leahy Scale: Fair                               Pertinent Vitals/Pain Pain Assessment: No/denies pain    Home Living Family/patient expects to be discharged to:: Private residence Living Arrangements: Alone Available Help at Discharge: Family;Friend(s);Available PRN/intermittently Type of Home: House Home Access: Stairs to enter Entrance Stairs-Rails: Right Entrance Stairs-Number of Steps: 3 Home Layout: One level Home Equipment: Walker - 2 wheels      Prior Function Level of Independence: Independent with assistive device(s)   Gait / Transfers Assistance Needed: ambulates with use of a cane           Hand Dominance  Extremity/Trunk Assessment   Upper Extremity Assessment Upper Extremity Assessment: Defer to OT evaluation;Overall [facility] for tasks assessed    Lower Extremity Assessment Lower Extremity Assessment: Overall WFL for tasks assessed    Cervical / Trunk Assessment Cervical / Trunk Assessment: Normal  Communication   Communication: No difficulties  Cognition  Arousal/Alertness: Awake/alert Behavior During Therapy: WFL for tasks assessed/performed Overall Cognitive Status: No family/caregiver present to determine baseline cognitive functioning Area of Impairment: Problem solving;Safety/judgement                         Safety/Judgement: Decreased awareness of deficits   Problem Solving: Requires verbal cues        General Comments      Exercises     Assessment/Plan    PT Assessment Patient needs continued PT services  PT Problem List Decreased balance;Decreased mobility;Decreased coordination;Decreased knowledge of use of DME;Decreased safety awareness;Decreased knowledge of precautions       PT Treatment Interventions DME instruction;Gait training;Stair training;Functional mobility training;Therapeutic activities;Therapeutic exercise;Balance training;Neuromuscular re-education;Patient/family education    PT Goals (Current goals can be found in the Care Plan section)  Acute Rehab PT Goals Patient Stated Goal: for his foot to heal PT Goal Formulation: With patient Time For Goal Achievement: 04/03/20 Potential to Achieve Goals: Good    Frequency Min 3X/week   Barriers to discharge        Co-evaluation               AM-PAC PT "6 Clicks" Mobility  Outcome Measure Help needed turning from your back to your side while in a flat bed without using bedrails?: None Help needed moving from lying on your back to sitting on the side of a flat bed without using bedrails?: None Help needed moving to and from a bed to a chair (including a wheelchair)?: None Help needed standing up from a chair using your arms (e.g., wheelchair or bedside chair)?: None Help needed to walk in hospital room?: None Help needed climbing 3-5 steps with a railing? : A Little 6 Click Score: 23    End of Session   Activity Tolerance: Patient tolerated treatment well Patient left: in bed;with call bell/phone within reach;Other (comment)(seated  EOB) Nurse Communication: Mobility status PT Visit Diagnosis: Other abnormalities of gait and mobility (R26.89)    Time: [id number] PT Time Calculation (min) (ACUTE ONLY): 32 min   Charges:   PT Evaluation $PT Eval Moderate Complexity: 1 Mod PT Treatments $Gait Training: 8-22 mins        Anastasio Champion, DPT  Acute Rehabilitation Services Pager [phone] Office [place] 03/20/2020, 12:35 PM

## 2020-03-20 NOTE — Progress Notes (Signed)
ANTICOAGULATION CONSULT NOTE - Follow Up Consult  Pharmacy Consult for Coumadin Indication: h/o VTE  Allergies  Allergen Reactions  . Other Other (See Comments)    "Cactus" caused blisters on tongue (if prepared as food)    Patient Measurements: Height: 5' 9.5" (176.5 cm) Weight: 84.1 kg (185 lb 4.8 oz) IBW/kg (Calculated) : 71.85   Vital Signs: Temp: 98.2 F (36.8 C) (05/13 0731) Temp Source: Oral (05/13 0731) BP: 123/69 (05/13 0834) Pulse Rate: 60 (05/13 0834)  Labs: Recent Labs    03/18/20 0207 03/18/20 0207 03/18/20 0409 03/19/20 0710 03/20/20 0246  HGB 11.0*   < >  --  13.1 11.8*  HCT 35.0*  --   --  40.9 36.0*  PLT 270  --   --  325 278  LABPROT >90.0*  --   --  40.9* 32.7*  INR >10.0*  --   --  4.4* 3.3*  CREATININE 0.64  --   --  0.93 1.02  TROPONINIHS 7  --  8  --   --    < > = values in this interval not displayed.    Estimated Creatinine Clearance: 78.3 mL/min (by C-G formula based on SCr of 1.02 mg/dL).  Assessment: Anticoag: Warfarin for hx VTE, MV repair - Goal INR 2-3. Went into afib 5/12 PM. INR down to 3.3 today. Hgb down to 11.8. Plts 278 WNL  - 5/11 INR >10, Vit K+ 2.5mg  po x 1 - PTA Coumadin 7.5mg  daily with admit INR>10  Goal of Therapy:  INR 2-3 Monitor platelets by anticoagulation protocol: Yes   Plan:  Resume Coumadin 5mg  po x 1 today. Daily INR   Smayan Hackbart S. Alford [place], PharmD, BCPS Clinical Staff Pharmacist [email or web address] Alford Highland, [place] 03/20/2020,11:18 AM

## 2020-03-20 NOTE — Progress Notes (Signed)
CRITICAL VALUE ALERT  Critical Value: Surgical PCR+MRSA &SA  Date & Time Notied:  03/20/20 @ 2046  Provider Notified: Dr Myna Hidalgo  Orders Received/Actions taken:

## 2020-03-20 NOTE — Evaluation (Signed)
Occupational Therapy Evaluation and Discharge Patient Details Name: Leonard Bentley MRN: [id number] DOB: Jul 03, 1961 Today's Date: 03/20/2020    History of Present Illness Pt is a 59 y/o male admitted secondary to chest tightness. Pt found to have pulmonary edema with history of diastolic congestive heart.  PMH including but not limited to HTN, chronic diastolic CHF, severe MR s/p mitral valve repair, recurrent PE, anxiety, non-Hodgkin's lymphoma, osteomyelitis of the left foot, and alcohol abuse.   Clinical Impression   Pt is functioning modified independent, but does not consistently adhere to weight bearing through L heel. No OT needs.    Follow Up Recommendations  No OT follow up    Equipment Recommendations  None recommended by OT    Recommendations for Other Services       Precautions / Restrictions Precautions Precautions: Fall Restrictions Weight Bearing Restrictions: Yes LLE Weight Bearing: Weight bearing as tolerated Other Position/Activity Restrictions: WBAT THROUGH HEEL      Mobility Bed Mobility               General bed mobility comments: walking around room upon arrival  Transfers Overall transfer level: Modified independent Equipment used: Rolling walker (2 wheeled)              Balance Overall balance assessment: Modified Independent Sitting-balance support: Feet supported Sitting balance-Leahy Scale: Good     Standing balance support: During functional activity Standing balance-Leahy Scale: Fair                             ADL either performed or assessed with clinical judgement   ADL Overall ADL's : Modified independent                                             Vision Patient Visual Report: No change from baseline       Perception     Praxis      Pertinent Vitals/Pain Pain Assessment: No/denies pain     Hand Dominance Right   Extremity/Trunk Assessment Upper Extremity Assessment Upper  Extremity Assessment: Overall WFL for tasks assessed   Lower Extremity Assessment Lower Extremity Assessment: Defer to PT evaluation   Cervical / Trunk Assessment Cervical / Trunk Assessment: Normal   Communication Communication Communication: No difficulties   Cognition Arousal/Alertness: Awake/alert Behavior During Therapy: WFL for tasks assessed/performed Overall Cognitive Status: No family/caregiver present to determine baseline cognitive functioning Area of Impairment: Problem solving;Safety/judgement                         Safety/Judgement: Decreased awareness of deficits   Problem Solving: Requires verbal cues General Comments: pt walking about his room without a device or adherence to weight bearing precautions   General Comments       Exercises     Shoulder Instructions      Home Living Family/patient expects to be discharged to:: Private residence Living Arrangements: Alone Available Help at Discharge: Family;Friend(s);Available PRN/intermittently Type of Home: House Home Access: Stairs to enter [employer] of Steps: 3 Entrance Stairs-Rails: Right Home Layout: One level     Bathroom Shower/Tub: Teacher, early years/pre: Standard     Home Equipment: Environmental consultant - 2 wheels          Prior Functioning/Environment Level of Independence: Independent with assistive  device(s)  Gait / Transfers Assistance Needed: ambulates with use of a cane              OT Problem List:        OT Treatment/Interventions:      OT Goals(Current goals can be found in the care plan section) Acute Rehab OT Goals Patient Stated Goal: for his foot to heal  OT Frequency:     Barriers to D/C:            Co-evaluation              AM-PAC OT "6 Clicks" Daily Activity     Outcome Measure Help from another person eating meals?: None Help from another person taking care of personal grooming?: None Help from another person toileting,  which includes using toliet, bedpan, or urinal?: None Help from another person bathing (including washing, rinsing, drying)?: None Help from another person to put on and taking off regular upper body clothing?: None Help from another person to put on and taking off regular lower body clothing?: None 6 Click Score: 24   End of Session    Activity Tolerance: Patient tolerated treatment well Patient left: in bed;with call bell/phone within reach  OT Visit Diagnosis: Other abnormalities of gait and mobility (R26.89)                Time: [id number] OT Time Calculation (min): 19 min Charges:  OT General Charges $OT Visit: 1 Visit OT Evaluation $OT Eval Moderate Complexity: 1 Mod  Nestor Lewandowsky, OTR/L Acute Rehabilitation Services Pager: [phone] Office: [phone]  Malka So 03/20/2020, 1:19 PM

## 2020-03-20 NOTE — Consult Note (Signed)
[facility] Nurse Consult Note: Reason for Consult: Dehiscence left foot transmetatarsal amputation site 01/2020.  Patient reports an increase in pain followed by dehiscence along the suture line a few days ago.  Right plantar toe with 0.3 cm scabbed nonhealing wound Wound type: surgical dehiscence  Pressure Injury POA: NA Measurement: Right toe:  0.3 cm  LEft foot  Transmetatarsal amputation site:  8 cm width with dehiscence of 0.1 cm x 2 cm x 0.1 cm  Wound [id number] pink and dry Drainage (amount, consistency, odor) none Periwound:suture line, healed Dressing procedure/placement/frequency: Cleanse left foot and right toe wounds with NS. Apply Xeroform gauze to wound bed.  Cover with dry dressing and tape.  Change daily.  Will not follow at this time.  Please re-consult if needed.  Domenic Moras MSN, RN, FNP-BC [facility]ON Wound, Ostomy, Continence Nurse Pager [phone]

## 2020-03-20 NOTE — H&P (View-Only) (Signed)
ORTHOPAEDIC CONSULTATION  REQUESTING PHYSICIAN: Alma Friendly, MD  Chief Complaint: Increased swelling and redness left transmetatarsal amputation with wound dehiscence.  HPI: Leonard Bentley is a 59 y.o. male who presents with anxiety as well as heart irregularity with progressive swelling redness and wound dehiscence left transmetatarsal amputation.  Patient has been full weightbearing.  Past Medical History:  Diagnosis Date  . Acute pulmonary embolism ([place]) 04/11/2016  . Anxiety   . Arthritis   . Atrial tachycardia ([place]) 01/06/2016  . Depression   . DTs (delirium tremens) ([place])   . Dyspnea   . ED (erectile dysfunction)   . ETOH abuse   . Hypertension   . Lymphoma, non Hodgkin's 09/25/2011   Stage 2  . Mitral regurgitation 01/20/2016  . Mobitz I 10/09/2015  . Murmur 01/06/2016  . Occasional tremors   . PAC (premature atrial contraction) 10/09/2015  . S/P minimally-invasive mitral valve repair 10/27/2016   Complex valvuloplasty including artificial Gore-tex neochord placement x6 and 34 mm Edwards Physio II ring annuloplasty via right mini thoracotomy approach   Past Surgical History:  Procedure Laterality Date  . AMPUTATION Left 01/12/2019   Procedure: LEFT GREAT TOE AMPUTATION;  Surgeon: [name], MD;  Location: [facility];  Service: Orthopedics;  Laterality: Left;  . AMPUTATION Left 01/16/2020   Procedure: LEFT 1ST METATARSAL AND SECOND TOE AMPUTATION;  Surgeon: [name], MD;  Location: [facility];  Service: Orthopedics;  Laterality: Left;  . AMPUTATION Left 01/30/2020   Procedure: LEFT FOOT 3RD RAY AMPUTATION;  Surgeon: [name], MD;  Location: [facility];  Service: Orthopedics;  Laterality: Left;  . CARDIAC CATHETERIZATION N/A 08/13/2016   Procedure: Right/Left Heart Cath and Coronary Angiography;  Surgeon: [name], MD;  Location: [facility];  Service: Cardiovascular;  Laterality: N/A;  . MITRAL VALVE REPAIR Right 10/27/2016   Procedure: MINIMALLY  INVASIVE MITRAL VALVE REPAIR (MVR) USING 34 PHYSIO II ANNULOPLASTY RING;  Surgeon: [name], MD;  Location: [facility];  Service: Open Heart Surgery;  Laterality: Right;  . SKIN BIOPSY Right 04/04/2018   right mid medial anterior tibial shave  see report in chart  . TEE WITHOUT CARDIOVERSION N/A 02/11/2016   Procedure: TRANSESOPHAGEAL ECHOCARDIOGRAM (TEE);  Surgeon: [name], MD;  Location: [employer] [facility];  Service: Cardiovascular;  Laterality: N/A;  . TEE WITHOUT CARDIOVERSION N/A 10/27/2016   Procedure: TRANSESOPHAGEAL ECHOCARDIOGRAM (TEE);  Surgeon: [name], MD;  Location: [facility];  Service: Open Heart Surgery;  Laterality: N/A;  . TRIGGER FINGER RELEASE Bilateral    Social History   Socioeconomic History  . Marital status: Single    Spouse name: Not on file  . Number of children: Not on file  . Years of education: Not on file  . Highest education level: Not on file  Occupational History  . Occupation: unemployed  Tobacco Use  . Smoking status: Current Every Enright Smoker    Packs/Longenecker: 1.00    Years: 40.00    Pack years: 40.00    Types: Cigarettes, E-cigarettes  . Smokeless tobacco: Former Systems developer    Quit date: 1985  . Tobacco comment: vapes all [place]  Substance and Sexual Activity  . Alcohol use: Yes    Alcohol/week: 0.0 standard drinks    Comment: 1-2 bottles of red wine a [place]  . Drug use: No  . Sexual activity: Not Currently  Other Topics Concern  . Not on file  Social History Narrative   Epworth sleepiness  scale as of 10/09/15 a 1   Social Determinants of Health   Financial Resource Strain:   . Difficulty of Paying Living Expenses:   Food Insecurity:   . Worried About Charity fundraiser in the Last Year:   . Arboriculturist in the Last Year:   Transportation Needs:   . Film/video editor (Medical):   Marland Kitchen Lack of Transportation (Non-Medical):   Physical Activity:   . Days of Exercise per Week:   . Minutes of Exercise per Session:   Stress:   . Feeling  of Stress :   Social Connections:   . Frequency of Communication with Friends and Family:   . Frequency of Social Gatherings with Friends and Family:   . Attends Religious Services:   . Active Member of Clubs or Organizations:   . Attends Archivist Meetings:   Marland Kitchen Marital Status:    Family History  Problem Relation Age of Onset  . Cancer Mother        BREAST(BONE)  . Cancer Father        PANCREATIC  . Hypertension Maternal Grandmother   . Stroke Maternal Aunt   . Heart attack Neg Hx    - negative except otherwise stated in the family history section Allergies  Allergen Reactions  . Other Other (See Comments)    "Cactus" caused blisters on tongue (if prepared as food)   Prior to Admission medications   Medication Sig Start Date End Date Taking? Authorizing Provider  acetaminophen (TYLENOL) 325 MG tablet Take 2 tablets (650 mg total) by mouth every 6 (six) hours as needed for mild pain (or Fever >/= 101). 02/25/20  Yes Ghimire, Henreitta Leber, MD  ALPRAZolam Duanne Moron) 0.25 MG tablet Take 1 tablet (0.25 mg total) by mouth 2 (two) times daily as needed for anxiety. 03/07/20  Yes [name], MD  docusate sodium (COLACE) 100 MG capsule Take 1 capsule (100 mg total) by mouth 2 (two) times daily. 02/25/20 03/26/20 Yes Ghimire, Henreitta Leber, MD  gabapentin (NEURONTIN) 100 MG capsule Take 1 capsule (100 mg total) by mouth 3 (three) times daily. 02/25/20  Yes Ghimire, Henreitta Leber, MD  metoprolol succinate (TOPROL-XL) 50 MG 24 hr tablet Take 1 tablet (50 mg total) by mouth daily. Take with or immediately following a meal. 02/25/20  Yes Ghimire, Henreitta Leber, MD  oxyCODONE (OXY IR/ROXICODONE) 5 MG immediate release tablet Take 1 tablet (5 mg total) by mouth every 6 (six) hours as needed for moderate pain (pain score 4-6). 03/07/20  Yes [name], MD  warfarin (COUMADIN) 7.5 MG tablet Take 1 tablet by mouth daily. Follow-up with [facility] for INR and dosing instructions 02/25/20  Yes Ghimire,  Henreitta Leber, MD  nicotine (NICODERM CQ - DOSED IN MG/24 HOURS) 21 mg/24hr patch Place 1 patch (21 mg total) onto the skin daily. 02/25/20   Jonetta Osgood, MD   ECHOCARDIOGRAM COMPLETE  Result Date: 03/18/2020    ECHOCARDIOGRAM REPORT   Patient Name:   REVEN RAPTIS Lortie Date of Exam: 03/18/2020 Medical Rec #:  [id number]   Height:       69.0 in Accession #:    [id number]  Weight:       193.0 lb Date of Birth:  05/21/1961   BSA:          2.035 m Patient Age:    59 years    BP:           129/93 mmHg  Patient Gender: M           HR:           100 bpm. Exam Location:  Inpatient Procedure: 2D Echo, Cardiac Doppler and Color Doppler Indications:    CHF-Acute Diastolic [id number] / [id number]  History:        Patient has prior history of Echocardiogram examinations, most                 recent 08/31/2018. CHF, Mitral Valve Disease; Risk                 Factors:Current Smoker and Hypertension. Pulmonary embolism.                 SVT.                  Mitral Valve: prosthetic annuloplasty ring valve is present in                 the mitral position. Procedure Date: 10/27/2016.  Sonographer:    Vickie Epley RDCS Referring Phys: [id number] RONDELL A SMITH IMPRESSIONS  1. Left ventricular ejection fraction, by estimation, is 50 to 55%. The left ventricle has low normal function. The left ventricle has no regional wall motion abnormalities. Left ventricular diastolic parameters are consistent with Grade I diastolic dysfunction (impaired relaxation).  2. Right ventricular systolic function is normal. The right ventricular size is normal. Tricuspid regurgitation signal is inadequate for assessing PA pressure.  3. Left atrial size was mildly dilated.  4. The mitral valve has been repaired/replaced. Trivial mitral valve regurgitation. No evidence of mitral stenosis. The mean mitral valve gradient is 1.9 mmHg with average heart rate of 97 bpm. There is a prosthetic annuloplasty ring present in the mitral position. Procedure Date: 10/27/2016.  5.  The aortic valve is normal in structure. Aortic valve regurgitation is not visualized.  6. The inferior vena cava is normal in size with greater than 50% respiratory variability, suggesting right atrial pressure of 3 mmHg. Comparison(s): No significant change from prior study. Prior images reviewed side by side. FINDINGS  Left Ventricle: Left ventricular ejection fraction, by estimation, is 50 to 55%. The left ventricle has low normal function. The left ventricle has no regional wall motion abnormalities. The left ventricular internal cavity size was normal in size. There is no left ventricular hypertrophy. Abnormal (paradoxical) septal motion consistent with post-operative status. Left ventricular diastolic function could not be evaluated due to mitral valve repair. Left ventricular diastolic parameters are consistent with Grade I diastolic dysfunction (impaired relaxation). Normal left ventricular filling pressure. Right Ventricle: The right ventricular size is normal. No increase in right ventricular wall thickness. Right ventricular systolic function is normal. Tricuspid regurgitation signal is inadequate for assessing PA pressure. Left Atrium: Left atrial size was mildly dilated. Right Atrium: Right atrial size was normal in size. Pericardium: There is no evidence of pericardial effusion. Mitral Valve: Neochordae and annuloplasty ring are seen. The mitral valve has been repaired/replaced. Trivial mitral valve regurgitation. There is a prosthetic annuloplasty ring present in the mitral position. Procedure Date: 10/27/2016. No evidence of mitral valve stenosis. The mean mitral valve gradient is 1.9 mmHg with average heart rate of 97 bpm. Tricuspid Valve: The tricuspid valve is normal in structure. Tricuspid valve regurgitation is not demonstrated. Aortic Valve: The aortic valve is normal in structure. Aortic valve regurgitation is not visualized. Pulmonic Valve: The pulmonic valve was normal in structure.  Pulmonic valve regurgitation is trivial. Aorta: The aortic  root and ascending aorta are structurally normal, with no evidence of dilitation. Venous: The inferior vena cava is normal in size with greater than 50% respiratory variability, suggesting right atrial pressure of 3 mmHg. IAS/Shunts: No atrial level shunt detected by color flow Doppler.  LEFT VENTRICLE PLAX 2D LVIDd:         5.00 cm  Diastology LVIDs:         3.50 cm  LV e' lateral:   8.03 cm/s LV PW:         1.00 cm  LV E/e' lateral: 7.4 LV IVS:        1.00 cm  LV e' medial:    5.27 cm/s LVOT diam:     2.70 cm  LV E/e' medial:  11.3 LV SV:         62 LV SV Index:   30 LVOT Area:     5.73 cm  RIGHT VENTRICLE RV S prime:     7.23 cm/s TAPSE (M-mode): 1.3 cm LEFT ATRIUM             Index       RIGHT ATRIUM           Index LA diam:        4.00 cm 1.97 cm/m  RA Area:     12.50 cm LA Vol (A2C):   53.7 ml 26.39 ml/m RA Volume:   20.10 ml  9.88 ml/m LA Vol (A4C):   49.4 ml 24.28 ml/m LA Biplane Vol: 52.1 ml 25.60 ml/m  AORTIC VALVE LVOT Vmax:   67.20 cm/s LVOT Vmean:  46.500 cm/s LVOT VTI:    0.108 m  AORTA Ao Root diam: 3.90 cm Ao Asc diam:  3.70 cm MITRAL VALVE MV Area (PHT): 3.50 cm    SHUNTS MV Mean grad:  1.9 mmHg    Systemic VTI:  0.11 m MV Decel Time: 217 msec    Systemic Diam: 2.70 cm MV E velocity: 59.70 cm/s MV A velocity: 97.50 cm/s MV E/A ratio:  0.61 Mihai Croitoru MD Electronically signed by Sanda Klein MD Signature Date/Time: 03/18/2020/3:07:19 PM    Final    - pertinent xrays, CT, MRI studies were reviewed and independently interpreted  Positive ROS: All other systems have been reviewed and were otherwise negative with the exception of those mentioned in the HPI and as above.  Physical Exam: General: Alert, no acute distress Psychiatric: Patient is competent for consent with normal mood and affect Lymphatic: No axillary or cervical lymphadenopathy Cardiovascular: No pedal edema Respiratory: No cyanosis, no use of accessory  musculature GI: No organomegaly, abdomen is soft and non-tender    Images:  @ENCIMAGES @  Labs:  Lab Results  Component Value Date   HGBA1C 5.8 (H) 02/05/2020   HGBA1C 5.2 10/25/2016   HGBA1C 6.1 (H) 04/03/2016   ESRSEDRATE 17 (H) 03/18/2020   ESRSEDRATE 14 01/28/2020   ESRSEDRATE 7 01/12/2020   CRP 0.5 03/18/2020   CRP 1.2 (H) 01/28/2020   CRP 0.8 01/12/2020   LABURIC 5.2 12/09/2018   LABURIC 7.4 08/18/2015   LABURIC 4.1 12/01/2011   REPTSTATUS 02/01/2020 FINAL 01/27/2020   REPTSTATUS 02/01/2020 FINAL 01/27/2020   CULT  01/27/2020    NO GROWTH 5 DAYS Performed at [facility], [place] [address], [place], [place] [address]    CULT  01/27/2020    NO GROWTH 5 DAYS Performed at [facility] [address] [place],  [address]     Lab Results  Component Value Date   ALBUMIN 2.7 (L) 02/02/2020   ALBUMIN 2.9 (L) 01/31/2020   ALBUMIN 2.8 (L) 01/31/2020   LABURIC 5.2 12/09/2018   LABURIC 7.4 08/18/2015   LABURIC 4.1 12/01/2011    Neurologic: Patient does not have protective sensation bilateral lower extremities.   MUSCULOSKELETAL:   Skin: Examination patient has increased redness swelling and wound dehiscence.  Radiographs shows osteomyelitis of the first second and third metatarsals.  Assessment: Assessment: Osteomyelitis with wound breakdown left foot  Plan: Plan: We will plan for revision left transmetatarsal amputation.  Discussed the importance of smoking cessation patient is currently smoking daily.  Discussed the importance of decreasing alcohol intake he states he is drinking a half a bottle of wine a [place].  Will post for surgery tomorrow Friday afternoon.  Thank you for the consult and the opportunity to see Mr. Ettrick, MD [facility] [phone] 12:43 PM

## 2020-03-20 NOTE — Plan of Care (Signed)
  Problem: Education: Goal: Knowledge of General Education information will improve Description: Including pain rating scale, medication(s)/side effects and non-pharmacologic comfort measures Outcome: Progressing   Problem: Clinical Measurements: Goal: Respiratory complications will improve Outcome: Progressing   Problem: Activity: Goal: Risk for activity intolerance will decrease Outcome: Progressing   Problem: Nutrition: Goal: Adequate nutrition will be maintained Outcome: Progressing   Problem: Elimination: Goal: Will not experience complications related to urinary retention Outcome: Progressing   Problem: Pain Managment: Goal: General experience of comfort will improve Outcome: Progressing   Problem: Safety: Goal: Ability to remain free from injury will improve Outcome: Progressing

## 2020-03-21 ENCOUNTER — Inpatient Hospital Stay (HOSPITAL_COMMUNITY): Payer: Medicaid Other | Admitting: Anesthesiology

## 2020-03-21 ENCOUNTER — Encounter (HOSPITAL_COMMUNITY)
Admission: EM | Disposition: A | Discharge status: Home / Self Care | Payer: Self-pay | Source: Home / Self Care | Attending: Internal Medicine

## 2020-03-21 ENCOUNTER — Encounter (HOSPITAL_COMMUNITY): Payer: Self-pay | Admitting: Internal Medicine

## 2020-03-21 DIAGNOSIS — M86272 Subacute osteomyelitis, left ankle and foot: Secondary | ICD-10-CM

## 2020-03-21 HISTORY — PX: AMPUTATION: SHX166

## 2020-03-21 LAB — CBC WITH DIFFERENTIAL/PLATELET
Abs Immature Granulocytes: 0.01 10*3/uL (ref 0.00–0.07)
Basophils Absolute: 0 10*3/uL (ref 0.0–0.1)
Basophils Relative: 1 %
Eosinophils Absolute: 0.5 10*3/uL (ref 0.0–0.5)
Eosinophils Relative: 13 %
HCT: 33.5 % — ABNORMAL LOW (ref 39.0–52.0)
Hemoglobin: 10.6 g/dL — ABNORMAL LOW (ref 13.0–17.0)
Immature Granulocytes: 0 %
Lymphocytes Relative: 14 %
Lymphs Abs: 0.5 10*3/uL — ABNORMAL LOW (ref 0.7–4.0)
MCH: 24.8 pg — ABNORMAL LOW (ref 26.0–34.0)
MCHC: 31.6 g/dL (ref 30.0–36.0)
MCV: 78.3 fL — ABNORMAL LOW (ref 80.0–100.0)
Monocytes Absolute: 0.5 10*3/uL (ref 0.1–1.0)
Monocytes Relative: 14 %
Neutro Abs: 2.1 10*3/uL (ref 1.7–7.7)
Neutrophils Relative %: 58 %
Platelets: 230 10*3/uL (ref 150–400)
RBC: 4.28 MIL/uL (ref 4.22–5.81)
RDW: 14.1 % (ref 11.5–15.5)
WBC: 3.6 10*3/uL — ABNORMAL LOW (ref 4.0–10.5)
nRBC: 0 % (ref 0.0–0.2)

## 2020-03-21 LAB — BASIC METABOLIC PANEL
Anion gap: 9 (ref 5–15)
BUN: 25 mg/dL — ABNORMAL HIGH (ref 6–20)
CO2: 28 mmol/L (ref 22–32)
Calcium: 9.1 mg/dL (ref 8.9–10.3)
Chloride: 99 mmol/L (ref 98–111)
Creatinine, Ser: 1 mg/dL (ref 0.61–1.24)
GFR calc Af Amer: >60 mL/min (ref >60)
GFR calc non Af Amer: >60 mL/min (ref >60)
Glucose, Bld: 98 mg/dL (ref 70–99)
Potassium: 3.8 mmol/L (ref 3.5–5.1)
Sodium: 136 mmol/L (ref 135–145)

## 2020-03-21 LAB — PROTIME-INR
INR: 3.4 — ABNORMAL HIGH (ref 0.8–1.2)
Prothrombin Time: 33.4 seconds — ABNORMAL HIGH (ref 11.4–15.2)

## 2020-03-21 SURGERY — AMPUTATION, FOOT, RAY
Anesthesia: Monitor Anesthesia Care | Laterality: Left

## 2020-03-21 MED ORDER — POVIDONE-IODINE 10 % EX SWAB
2.0000 "application " | Freq: Once | CUTANEOUS | Status: AC
  Administered 2020-03-21: 2 via TOPICAL

## 2020-03-21 MED ORDER — 0.9 % SODIUM CHLORIDE (POUR BTL) OPTIME
TOPICAL | Status: DC | PRN
  Administered 2020-03-21: 1000 mL

## 2020-03-21 MED ORDER — CEFAZOLIN SODIUM-DEXTROSE 2-4 GM/100ML-% IV SOLN
2.0000 g | Freq: Four times a day (QID) | INTRAVENOUS | Status: AC
  Administered 2020-03-21 – 2020-03-22 (×3): 2 g via INTRAVENOUS
  Filled 2020-03-21 (×3): qty 100

## 2020-03-21 MED ORDER — LACTATED RINGERS IV SOLN: INTRAVENOUS | Status: DC

## 2020-03-21 MED ORDER — FENTANYL CITRATE (PF) 100 MCG/2ML IJ SOLN: 50.0000 ug | Freq: Once | INTRAMUSCULAR | Status: AC

## 2020-03-21 MED ORDER — VANCOMYCIN HCL IN DEXTROSE 1-5 GM/200ML-% IV SOLN
1000.0000 mg | Freq: Once | INTRAVENOUS | Status: AC
  Administered 2020-03-21: 1000 mg via INTRAVENOUS

## 2020-03-21 MED ORDER — HYDROMORPHONE HCL 1 MG/ML IJ SOLN
0.5000 mg | INTRAMUSCULAR | Status: DC | PRN
  Administered 2020-03-21 – 2020-03-25 (×14): 0.5 mg via INTRAVENOUS
  Filled 2020-03-21 (×14): qty 1

## 2020-03-21 MED ORDER — OXYCODONE HCL 5 MG/5ML PO SOLN: 5.0000 mg | Freq: Once | ORAL | Status: DC | PRN

## 2020-03-21 MED ORDER — CHLORHEXIDINE GLUCONATE 4 % EX LIQD: 60.0000 mL | Freq: Once | CUTANEOUS | Status: DC

## 2020-03-21 MED ORDER — LACTATED RINGERS IV SOLN: INTRAVENOUS | Status: DC | PRN

## 2020-03-21 MED ORDER — FENTANYL CITRATE (PF) 100 MCG/2ML IJ SOLN
INTRAMUSCULAR | Status: AC
  Administered 2020-03-21: 50 ug via INTRAVENOUS
  Filled 2020-03-21: qty 2

## 2020-03-21 MED ORDER — MIDAZOLAM HCL 2 MG/2ML IJ SOLN
INTRAMUSCULAR | Status: AC
  Administered 2020-03-21: 2 mg via INTRAVENOUS
  Filled 2020-03-21: qty 2

## 2020-03-21 MED ORDER — MUPIROCIN 2 % EX OINT
TOPICAL_OINTMENT | Freq: Two times a day (BID) | CUTANEOUS | Status: DC
  Administered 2020-03-27: 1 via NASAL
  Filled 2020-03-21: qty 22

## 2020-03-21 MED ORDER — FENTANYL CITRATE (PF) 100 MCG/2ML IJ SOLN: 25.0000 ug | INTRAMUSCULAR | Status: DC | PRN

## 2020-03-21 MED ORDER — CEFAZOLIN SODIUM-DEXTROSE 2-4 GM/100ML-% IV SOLN
2.0000 g | INTRAVENOUS | Status: AC
  Administered 2020-03-21: 2 g via INTRAVENOUS
  Filled 2020-03-21: qty 100

## 2020-03-21 MED ORDER — MIDAZOLAM HCL 2 MG/2ML IJ SOLN: 2.0000 mg | Freq: Once | INTRAMUSCULAR | Status: AC

## 2020-03-21 MED ORDER — ONDANSETRON HCL 4 MG/2ML IJ SOLN
INTRAMUSCULAR | Status: DC | PRN
  Administered 2020-03-21: 4 mg via INTRAVENOUS

## 2020-03-21 MED ORDER — PROPOFOL 500 MG/50ML IV EMUL
INTRAVENOUS | Status: DC | PRN
  Administered 2020-03-21: 100 ug/kg/min via INTRAVENOUS

## 2020-03-21 MED ORDER — SODIUM CHLORIDE 0.9 % IV SOLN: INTRAVENOUS | Status: DC

## 2020-03-21 MED ORDER — PROPOFOL 10 MG/ML IV BOLUS
INTRAVENOUS | Status: DC | PRN
  Administered 2020-03-21: 20 mg via INTRAVENOUS

## 2020-03-21 MED ORDER — VANCOMYCIN HCL IN DEXTROSE 1-5 GM/200ML-% IV SOLN
INTRAVENOUS | Status: AC
  Filled 2020-03-21: qty 200

## 2020-03-21 MED ORDER — ONDANSETRON HCL 4 MG/2ML IJ SOLN: 4.0000 mg | Freq: Once | INTRAMUSCULAR | Status: DC | PRN

## 2020-03-21 MED ORDER — OXYCODONE HCL 5 MG PO TABS: 5.0000 mg | ORAL_TABLET | Freq: Once | ORAL | Status: DC | PRN

## 2020-03-21 SURGICAL SUPPLY — 28 items
BLADE SAW SGTL MED 73X18.5 STR (BLADE) IMPLANT
BLADE SURG 21 STRL SS (BLADE) ×2 IMPLANT
BNDG COHESIVE 4X5 TAN STRL (GAUZE/BANDAGES/DRESSINGS) ×2 IMPLANT
BNDG GAUZE ELAST 4 BULKY (GAUZE/BANDAGES/DRESSINGS) ×2 IMPLANT
COVER SURGICAL LIGHT HANDLE (MISCELLANEOUS) ×4 IMPLANT
COVER WAND RF STERILE (DRAPES) ×2 IMPLANT
DRAPE U-SHAPE 47X51 STRL (DRAPES) ×4 IMPLANT
DRSG ADAPTIC 3X8 NADH LF (GAUZE/BANDAGES/DRESSINGS) ×2 IMPLANT
DRSG PAD ABDOMINAL 8X10 ST (GAUZE/BANDAGES/DRESSINGS) ×4 IMPLANT
DURAPREP 26ML APPLICATOR (WOUND CARE) ×2 IMPLANT
ELECT REM PT RETURN 9FT ADLT (ELECTROSURGICAL) ×2
ELECTRODE REM PT RTRN 9FT ADLT (ELECTROSURGICAL) ×1 IMPLANT
GAUZE SPONGE 4X4 12PLY STRL (GAUZE/BANDAGES/DRESSINGS) ×2 IMPLANT
GLOVE BIOGEL PI IND STRL 9 (GLOVE) ×1 IMPLANT
GLOVE BIOGEL PI INDICATOR 9 (GLOVE) ×1
GLOVE SURG ORTHO 9.0 STRL STRW (GLOVE) ×2 IMPLANT
GOWN STRL REUS W/ TWL XL LVL3 (GOWN DISPOSABLE) ×2 IMPLANT
GOWN STRL REUS W/TWL XL LVL3 (GOWN DISPOSABLE) ×4
KIT BASIN OR (CUSTOM PROCEDURE TRAY) ×2 IMPLANT
KIT TURNOVER KIT B (KITS) ×2 IMPLANT
NS IRRIG 1000ML POUR BTL (IV SOLUTION) ×2 IMPLANT
PACK ORTHO EXTREMITY (CUSTOM PROCEDURE TRAY) ×2 IMPLANT
PAD ARMBOARD 7.5X6 YLW CONV (MISCELLANEOUS) ×4 IMPLANT
STOCKINETTE IMPERVIOUS LG (DRAPES) IMPLANT
SUT ETHILON 2 0 PSLX (SUTURE) ×2 IMPLANT
TOWEL GREEN STERILE (TOWEL DISPOSABLE) ×2 IMPLANT
TUBE CONNECTING 12X1/4 (SUCTIONS) ×2 IMPLANT
YANKAUER SUCT BULB TIP NO VENT (SUCTIONS) ×2 IMPLANT

## 2020-03-21 NOTE — Progress Notes (Signed)
ANTICOAGULATION CONSULT NOTE - Follow Up Consult  Pharmacy Consult for Coumadin Indication: h/o VTE  Allergies  Allergen Reactions  . Other Other (See Comments)    "Cactus" caused blisters on tongue (if prepared as food)    Patient Measurements: Height: 5' 9.5" (176.5 cm) Weight: 85.5 kg (188 lb 6.4 oz) IBW/kg (Calculated) : 71.85   Vital Signs: Temp: 98 F (36.7 C) (05/14 0512) Temp Source: Oral (05/14 0512) BP: 110/88 (05/14 0512) Pulse Rate: 89 (05/14 0512)  Labs: Recent Labs    03/19/20 0710 03/19/20 0710 03/20/20 0246 03/21/20 0623  HGB 13.1   < > 11.8* 10.6*  HCT 40.9  --  36.0* 33.5*  PLT 325  --  278 230  LABPROT 40.9*  --  32.7* 33.4*  INR 4.4*  --  3.3* 3.4*  CREATININE 0.93  --  1.02 1.00   < > = values in this interval not displayed.    Estimated Creatinine Clearance: 79.9 mL/min (by C-G formula based on SCr of 1 mg/dL).  Assessment:  Anticoag: Warfarin for hx VTE, MV repair - Goal INR 2-3. Went into afib 5/12 PM. INR 3.3>3.4 today. Hgb down to 11.8>10.6. Plts 230 down some.  - 5/11 INR >10, Vit K+ 2.5mg  po x 1 - PTA Coumadin 7.5mg  daily with admit INR>10  Goal of Therapy:  INR 2-3 Monitor platelets by anticoagulation protocol: Yes   Plan:  Hold Coumadin today. Daily INR OR today for amputation noted.   Omayra Tulloch S. Alford Highland, PharmD, BCPS Clinical Staff Pharmacist [email or web address] Alford Highland, Rollyn Scialdone Stillinger 03/21/2020,8:55 AM

## 2020-03-21 NOTE — Anesthesia Preprocedure Evaluation (Addendum)
Anesthesia Evaluation  Patient identified by MRN, date of birth, ID band Patient awake    Reviewed: Allergy & Precautions, NPO status , Patient's Chart, lab work & pertinent test results  Airway Mallampati: II  TM Distance: >3 FB Neck ROM: Full    Dental  (+) Teeth Intact, Dental Advisory Given   Pulmonary Current Smoker, PE   breath sounds clear to auscultation       Cardiovascular hypertension, Pt. on home beta blockers  Rhythm:Regular Rate:Normal  ECG: rate 101. Sinus tachycardia with Premature atrial complexes. Left axis deviation. Right bundle branch block  ECHO: 1. Left ventricular ejection fraction, by estimation, is 50 to 55%. The left ventricle has low normal function. The left ventricle has no regional wall motion abnormalities. Left ventricular diastolic parameters are consistent with Grade I diastolic dysfunction (impaired relaxation). 2. Right ventricular systolic function is normal. The right ventricular size is normal. Tricuspid regurgitation signal is inadequate for assessing PA pressure. 3. Left atrial size was mildly dilated. 4. The mitral valve has been repaired/replaced. Trivial mitral valve regurgitation. No evidence of mitral stenosis. The mean mitral valve gradient is 1.9 mmHg with average heart rate of 97 bpm. There is a prosthetic annuloplasty ring present in the mitral position. Procedure Date: 10/27/2016. 5. The aortic valve is normal in structure. Aortic valve regurgitation is not visualized. 6. The inferior vena cava is normal in size with greater   Neuro/Psych PSYCHIATRIC DISORDERS Anxiety Depression negative neurological ROS     GI/Hepatic negative GI ROS, (+)     substance abuse  ,   Endo/Other  Lymphoma, non Hodgkin's  Renal/GU negative Renal ROS     Musculoskeletal negative musculoskeletal ROS (+)   Abdominal   Peds  Hematology  (+) anemia ,   Anesthesia Other  Findings Osteomyelitis Left Foot  Reproductive/Obstetrics                            Anesthesia Physical Anesthesia Plan  ASA: III  Anesthesia Plan: MAC   Post-op Pain Management:    Induction: Intravenous  PONV Risk Score and Plan: Ondansetron and Dexamethasone  Airway Management Planned: Natural Airway and Simple Face Mask  Additional Equipment:   Intra-op Plan:   Post-operative Plan:   Informed Consent: I have reviewed the patients History and Physical, chart, labs and discussed the procedure including the risks, benefits and alternatives for the proposed anesthesia with the patient or authorized representative who has indicated his/her understanding and acceptance.     Dental advisory given  Plan Discussed with: CRNA and Anesthesiologist  Anesthesia Plan Comments: (Plan Ankle block with MAC)        Anesthesia Quick Evaluation

## 2020-03-21 NOTE — Anesthesia Procedure Notes (Signed)
Anesthesia Regional Block: Ankle block   Pre-Anesthetic Checklist: ,, timeout performed, Correct Patient, Correct Site, Correct Laterality, Correct Procedure, Correct Position, site marked, Risks and benefits discussed, Surgical consent,  Pre-op evaluation,  At surgeon's request  Laterality: Left  Prep: chloraprep       Needles:  Injection technique: Single-shot  Needle Type: Other      Needle Gauge: 22     Additional Needles:   Narrative:  Start time: 03/21/2020 3:05 PM End time: 03/21/2020 3:10 PM Injection made incrementally with aspirations every 5 mL.  Performed by: Personally  Anesthesiologist: Roberts Gaudy, MD  Additional Notes: 35 cc 2% lidocaine injected easily

## 2020-03-21 NOTE — Interval H&P Note (Signed)
History and Physical Interval Note:  03/21/2020 6:44 AM  Leonard Bentley  has presented today for surgery, with the diagnosis of Osteomyelitis Left Foot.  The various methods of treatment have been discussed with the patient and family. After consideration of risks, benefits and other options for treatment, the patient has consented to  Procedure(s): MIDFOOT AMPUTATION; LEFT (Left) as a surgical intervention.  The patient's history has been reviewed, patient examined, no change in status, stable for surgery.  I have reviewed the patient's chart and labs.  Questions were answered to the patient's satisfaction.     [name]

## 2020-03-21 NOTE — Progress Notes (Signed)
PROGRESS NOTE  Leonard Bentley [id number] DOB: 1961-01-25 DOA: 03/18/2020 PCP: [name], NP  HPI/Recap of past 24 hours: HPI from Dr [name] Leonard Bentley is Leonard 59 y.o. male with medical history significant of HTN, chronic diastolic CHF, severe MR s/p mitral valve repair, recurrent PE, anxiety, non-Hodgkin's lymphoma, osteomyelitis of the left foot, and alcohol abuse presents with complaints of 1 week of chest tightness and shortness of breath.  He has had Leonard dry nonproductive cough.  Associated symptoms include complaints of leg swelling.  He reports having worsening pins on needles sensation in both of his feet.  Last hospitalized in March for left foot wound dehiscence with concern for possible osteomyelitis s/p left third ray amputation by Dr. [name].  He had been wearing compression stockings has advised by Dr. [name].  Notes he had been cleared to start bearing weight on his left foot, but noted that he had not been able to get around much due to the pain.  The wound had been closed to his knowledge and he had not noticed any drainage or redness.  Denies having any significant fever or chills. In the ED, heart rates elevated up into the 140s temporarily, blood pressures elevated to 133/106, and all other vital signs maintained.  Labs significant for WBC 3.1, hemoglobin 11, troponins negative x2, BNP WNL and INR noted to be supra therapeutic.  Chest x-ray significant for mild interstitial edema.  Patient was given 40 mg of Lasix IV.  TRH called to admit.     Today, patient denies any new complaints, still with some left foot tenderness.  Denies any chest pain, worsening shortness of breath, abdominal pain, nausea/vomiting, fever/chills.     Assessment/Plan: Principal Problem:   Pulmonary edema Active Problems:   Alcohol abuse   Cigarette smoker   Personal history of pulmonary embolism   Neuropathy due to chemotherapeutic drug ([place])   Anxiety   S/P minimally-invasive mitral valve  repair   Supratherapeutic INR   Wound dehiscence, surgical, initial encounter   Microcytic hypochromic anemia   Chest pain   Pulmonary edema/?Acute on chronic diastolic HF/History of mitral valve repair Currently on room air BNP WNL, Troponins negative Chest x-ray significant for mild interstitial edema Echo showed EF noted to be 50-55% Strict intake and output, daily weight Continue IV diuresis for now Follow-up with cardiology as an outpatient Telemetry  Supratherapeutic INR, R eye conjunctival hemorrhage On admission patient noted to have INR greater than 10 S/p 2.5 g of vitamin K p.o. Daily monitoring of PT/INR Pharmacy to dose  Wound dehiscence of left foot, history of osteomyelitis Patient has history of osteomyelitis of the left foot for which he just recently had Leonard third ray amputation by Dr. [name] on 01/30/20 Noted mild dehiscence of the wound, but no signs of discharge CRP WNL, ESR minimally elevated  X-rays of the left foot: Findings are suspicious for osteomyelitis  Continue oxycodone as needed for pain Dr. [name] on board, plan for revision left transmetatarsal amputation on 03/21/2020 Wound care on board, appreciate recs  Alcohol abuse, with possible withdrawal Patient still reports drinking Leonard bottle of wine daily. His last drink was PTA Counseled on need for cessation of alcohol abuse CIWA protocols with scheduled Ativan  Recurrent PEs on chronic anticoagulation D-dimer negative Patient with history of recurrent PEs on anticoagulation of Coumadin Coumadin per pharmacy  Neuropathy related with chemotherapy, history of Non-Hodgkin's lymphoma Patient with history of neuropathy thought to be related with chemotherapy drugs  to treat non-Hodgkin's lymphoma Previously had been treated with systemic chemotherapy of CHOP and Rituxan last given in 11/2011 Continue gabapentin  Anxiety Continue Xanax as needed  Tobacco abuse Continue nicotine patch        Malnutrition Type:      Malnutrition Characteristics:      Nutrition Interventions:       Estimated body mass index is 27.07 kg/m as calculated from the following:   Height as of this encounter: 5' 9.5" (1.765 m).   Weight as of this encounter: 84.4 kg.     Code Status: Full  Family Communication: Discussed extensively with patient  Disposition Plan: Status is: Inpatient  Remains inpatient appropriate because:Inpatient level of care appropriate due to severity of illness   Dispo: The patient is from: Home              Anticipated d/c is to: Home              Anticipated d/c date is: 2 days              Patient currently is not medically stable to d/c.  Still needs IV diuresis, surgery on 5/14, pending sign off from Dr [name]    Consultants:  Dr. [name]   Procedures:  None  Antimicrobials:  None  DVT prophylaxis: Coumadin    Objective: Vitals:   03/21/20 1505 03/21/20 1510 03/21/20 1515 03/21/20 1521  BP:  119/84 108/88 111/84  Pulse: 91 94 86 83  Resp: _0 Temp:      TempSrc:      SpO2: 97% 98% 100% 100%  Weight:      Height:        Intake/Output Summary (Last 24 hours) at 03/21/2020 1606 Last data filed at 03/21/2020 0523 Gross per 24 hour  Intake 600 ml  Output 400 ml  Net 200 ml   Filed Weights   03/20/20 0535 03/21/20 0512 03/21/20 1455  Weight: 84.1 kg 85.5 kg 84.4 kg    Exam:  General: NAD, tremor present  Cardiovascular: S1, S2 present  Respiratory:  Diminished breath sounds bilaterally  Abdomen: Soft, nontender, nondistended, bowel sounds present  Musculoskeletal: No bilateral pedal edema noted, ray amputation of left foot  Skin:  Wound dehiscence noted at the left foot near previous amputation  Psychiatry: Normal mood    Data Reviewed: CBC: Recent Labs  Lab 03/18/20 0207 03/19/20 0710 03/20/20 0246 03/21/20 0623  WBC 3.1* 5.2 4.8 3.6*  NEUTROABS  --   --  3.1 2.1  HGB 11.0* 13.1 11.8* 10.6*   HCT 35.0* 40.9 36.0* 33.5*  MCV 78.7* 77.2* 77.1* 78.3*  PLT 270 325 278 937   Basic Metabolic Panel: Recent Labs  Lab 03/18/20 0207 03/19/20 0710 03/20/20 0246 03/21/20 0623  NA 142 138 137 136  K 4.2 3.9 3.5 3.8  CL 108 98 98 99  CO2 _1 GLUCOSE 95 119* 125* 98  BUN 8 13 22* 25*  CREATININE 0.64 0.93 1.02 1.00  CALCIUM 8.9 9.5 9.0 9.1  MG  --  1.6*  --   --   PHOS  --  4.5  --   --    GFR: Estimated Creatinine Clearance: 79.9 mL/min (by C-G formula based on SCr of 1 mg/dL). Liver Function Tests: No results for input(s): AST, ALT, ALKPHOS, BILITOT, PROT, ALBUMIN in the last 168 hours. No results for input(s): LIPASE, AMYLASE in the last 168 hours. No results for input(s):  AMMONIA in the last 168 hours. Coagulation Profile: Recent Labs  Lab 03/18/20 0207 03/19/20 0710 03/20/20 0246 03/21/20 0623  INR >10.0* 4.4* 3.3* 3.4*   Cardiac Enzymes: No results for input(s): CKTOTAL, CKMB, CKMBINDEX, TROPONINI in the last 168 hours. BNP (last 3 results) No results for input(s): PROBNP in the last 8760 hours. HbA1C: No results for input(s): HGBA1C in the last 72 hours. CBG: No results for input(s): GLUCAP in the last 168 hours. Lipid Profile: No results for input(s): CHOL, HDL, LDLCALC, TRIG, CHOLHDL, LDLDIRECT in the last 72 hours. Thyroid Function Tests: No results for input(s): TSH, T4TOTAL, FREET4, T3FREE, THYROIDAB in the last 72 hours. Anemia Panel: Recent Labs    03/19/20 0710  FERRITIN 87  TIBC 381  IRON 203*   Urine analysis:    Component Value Date/Time   COLORURINE YELLOW 01/27/2020 1841   APPEARANCEUR CLEAR 01/27/2020 1841   APPEARANCEUR Clear 08/03/2019 1143   LABSPEC 1.015 01/27/2020 1841   PHURINE 6.0 01/27/2020 1841   GLUCOSEU NEGATIVE 01/27/2020 1841   HGBUR SMALL (Leonard) 01/27/2020 1841   BILIRUBINUR NEGATIVE 01/27/2020 1841   BILIRUBINUR small (Leonard) 08/03/2019 1144   BILIRUBINUR Negative 08/03/2019 1143   KETONESUR 5 (Leonard) 01/27/2020  1841   PROTEINUR 30 (Leonard) 01/27/2020 1841   UROBILINOGEN 1.0 08/03/2019 1144   UROBILINOGEN 1.0 01/25/2013 0825   NITRITE NEGATIVE 01/27/2020 1841   LEUKOCYTESUR NEGATIVE 01/27/2020 1841   Sepsis Labs: _0 (procalcitonin:4,lacticidven:4)  ) Recent Results (from the past 240 hour(s))  SARS CORONAVIRUS 2 (TAT 6-24 HRS) Nasopharyngeal Nasopharyngeal Swab     Status: None   Collection Time: 03/18/20  6:21 AM   Specimen: Nasopharyngeal Swab  Result Value Ref Range Status   SARS Coronavirus 2 NEGATIVE NEGATIVE Final    Comment: (NOTE) SARS-CoV-2 target nucleic acids are NOT DETECTED. The SARS-CoV-2 RNA is generally detectable in upper and lower respiratory specimens during the acute phase of infection. Negative results do not preclude SARS-CoV-2 infection, do not rule out co-infections with other pathogens, and should not be used as the sole basis for treatment or other patient management decisions. Negative results must be combined with clinical observations, patient history, and epidemiological information. The expected result is Negative. Fact Sheet for Patients: SugarRoll.be Fact Sheet for Healthcare Providers: https://www.woods-mathews.com/ This test is not yet approved or cleared by the [place] FDA and  has been authorized for detection and/or diagnosis of SARS-CoV-2 by FDA under an Emergency Use Authorization (EUA). This EUA will remain  in effect (meaning this test can be used) for the duration of the COVID-19 declaration under Section 56 4(b)(1) of the Act, 21 U.S.C. section 360bbb-3(b)(1), unless the authorization is terminated or revoked sooner. Performed at [facility], [place] [address], [place], [place] [address]   Surgical pcr screen     Status: Abnormal   Collection Time: 03/20/20  6:54 PM   Specimen: Nasal Mucosa; Nasal Swab  Result Value Ref Range Status   MRSA, PCR POSITIVE (Leonard) NEGATIVE Final    Comment:  RESULT CALLED TO, READ BACK BY AND VERIFIED WITH: K.GOAD,RN AT 2044 12/22/19 BY L.PITT    Staphylococcus aureus POSITIVE (Leonard) NEGATIVE Final    Comment: (NOTE) The Xpert SA Assay (FDA approved for NASAL specimens in patients 19 years of age and older), is one component of Leonard comprehensive surveillance program. It is not intended to diagnose infection nor to guide or monitor treatment.       Studies: No results found.  Scheduled Meds: . chlorhexidine  60 mL Topical Once  . [MAR Hold] docusate sodium  100 mg Oral BID  . [MAR Hold] folic acid  1 mg Oral Daily  . [MAR Hold] furosemide  40 mg Intravenous Daily  . [MAR Hold] gabapentin  100 mg Oral TID  . [MAR Hold] LORazepam  0-4 mg Intravenous Q12H  . [MAR Hold] metoprolol succinate  50 mg Oral Daily  . [MAR Hold] multivitamin with minerals  1 tablet Oral Daily  . [MAR Hold] mupirocin ointment   Nasal BID  . [MAR Hold] nicotine  21 mg Transdermal Daily  . [MAR Hold] sodium chloride flush  3 mL Intravenous Once  . [MAR Hold] sodium chloride flush  3 mL Intravenous Q12H  . [MAR Hold] thiamine  100 mg Oral Daily   Or  . [MAR Hold] thiamine  100 mg Intravenous Daily  . [MAR Hold] Warfarin - Pharmacist Dosing Inpatient   Does not apply q1600    Continuous Infusions: . [MAR Hold] sodium chloride    . lactated ringers 10 mL/hr at 03/21/20 1500     LOS: 2 days     Alma Friendly, MD [facility]  If 7PM-7AM, please contact night-coverage [email or web address] 03/21/2020, 4:06 PM

## 2020-03-21 NOTE — Progress Notes (Signed)
Orthopedic Tech Progress Note Patient Details:  Leonard Bentley [birth year] [id number]  Ortho Devices Type of Ortho Device: Postop shoe/boot Ortho Device/Splint Location: LLE Ortho Device/Splint Interventions: Ordered, Application   Post Interventions Patient Tolerated: Well Instructions Provided: Care of [place] 03/21/2020, 5:36 PM

## 2020-03-21 NOTE — Anesthesia Postprocedure Evaluation (Signed)
Anesthesia Post Note  Patient: Pardeep A Malizia  Procedure(s) Performed: MIDFOOT AMPUTATION; LEFT (Left )     Patient location during evaluation: PACU Anesthesia Type: MAC Level of consciousness: awake Pain management: pain level controlled Vital Signs Assessment: post-procedure vital signs reviewed and stable Respiratory status: spontaneous breathing, nonlabored ventilation, respiratory function stable and patient connected to nasal cannula oxygen Cardiovascular status: stable and blood pressure returned to baseline Postop Assessment: no apparent nausea or vomiting Anesthetic complications: no    Last Vitals:  Vitals:   03/21/20 1704 03/21/20 1721  BP: 110/90 (!) 112/93  Pulse:  84  Resp: 16 16  Temp: (!) 36.1 C (!) 36.4 C  SpO2: 94% 100%    Last Pain:  Vitals:   03/21/20 1721  TempSrc: Axillary  PainSc:                  Karyl Kinnier Andreea Arca

## 2020-03-21 NOTE — Op Note (Signed)
03/21/2020  4:27 PM  PATIENT:  Leonard Bentley    PRE-OPERATIVE DIAGNOSIS: Osteomyelitis left foot  POST-OPERATIVE DIAGNOSIS:  Same  PROCEDURE: Transmetatarsal amputation left foot with application Prevena wound VAC.  SURGEON:  [name], MD  PHYSICIAN ASSISTANT:None ANESTHESIA:   General  PREOPERATIVE INDICATIONS:  Leonard Bentley is a  59 y.o. male with a diagnosis of Osteomyelitis Left Foot who failed conservative measures and elected for surgical management.    The risks benefits and alternatives were discussed with the patient preoperatively including but not limited to the risks of infection, bleeding, nerve injury, cardiopulmonary complications, the need for revision surgery, among others, and the patient was willing to proceed.  OPERATIVE IMPLANTS: Praveena wound VAC 13 cm  _0 @  OPERATIVE FINDINGS: No abscess or bone involvement at the level of amputation.  OPERATIVE PROCEDURE: Patient was brought the operating room after regional block after adequate levels anesthesia with MAC anesthetic the left lower extremity was prepped using DuraPrep draped into a sterile field a timeout was called.  A fishmouth incision was made through the midfoot this was carried sharply down to bone a transmetatarsal amputation was made.  Electrocautery was used for hemostasis the wound was irrigated with normal saline.  Incision was closed using 2-0 nylon.  A Prevena wound VAC was applied this had a good suction fit this was overwrapped with Coban patient was taken the PACU in stable condition.   DISCHARGE PLANNING:  Antibiotic duration: Continue antibiotics for 24 hours  Weightbearing: Nonweightbearing on the left  Pain medication: Opioid pathway  Dressing care/ Wound VAC: Continue wound VAC for 1 week at discharge  Ambulatory devices: Walker or crutches  Discharge to: Anticipate discharge to home  Follow-up: In the office 1 week post operative.

## 2020-03-21 NOTE — Transfer of Care (Signed)
Immediate Anesthesia Transfer of Care Note  Patient: Leonard Bentley  Procedure(s) Performed: MIDFOOT AMPUTATION; LEFT (Left )  Patient Location: PACU  Anesthesia Type:MAC and Regional  Level of Consciousness: awake, alert  and oriented  Airway & Oxygen Therapy: Patient Spontanous Breathing  Post-op Assessment: Report given to RN and Post -op Vital signs reviewed and stable  Post vital signs: Reviewed and stable  Last Vitals:  Vitals Value Taken Time  BP 102/81 03/21/20 1622  Temp    Pulse 84 03/21/20 1626  Resp 17 03/21/20 1627  SpO2 100 % 03/21/20 1626  Vitals shown include unvalidated device data.  Last Pain:  Vitals:   03/21/20 1515  TempSrc:   PainSc: 0-No pain         Complications: No apparent anesthesia complications

## 2020-03-22 LAB — CBC WITH DIFFERENTIAL/PLATELET
Abs Immature Granulocytes: 0.03 10*3/uL (ref 0.00–0.07)
Basophils Absolute: 0 10*3/uL (ref 0.0–0.1)
Basophils Relative: 0 %
Eosinophils Absolute: 0.3 10*3/uL (ref 0.0–0.5)
Eosinophils Relative: 4 %
HCT: 34.3 % — ABNORMAL LOW (ref 39.0–52.0)
Hemoglobin: 10.8 g/dL — ABNORMAL LOW (ref 13.0–17.0)
Immature Granulocytes: 0 %
Lymphocytes Relative: 7 %
Lymphs Abs: 0.5 10*3/uL — ABNORMAL LOW (ref 0.7–4.0)
MCH: 24.9 pg — ABNORMAL LOW (ref 26.0–34.0)
MCHC: 31.5 g/dL (ref 30.0–36.0)
MCV: 79 fL — ABNORMAL LOW (ref 80.0–100.0)
Monocytes Absolute: 0.5 10*3/uL (ref 0.1–1.0)
Monocytes Relative: 8 %
Neutro Abs: 5.5 10*3/uL (ref 1.7–7.7)
Neutrophils Relative %: 81 %
Platelets: 246 10*3/uL (ref 150–400)
RBC: 4.34 MIL/uL (ref 4.22–5.81)
RDW: 14 % (ref 11.5–15.5)
WBC: 6.8 10*3/uL (ref 4.0–10.5)
nRBC: 0 % (ref 0.0–0.2)

## 2020-03-22 LAB — BASIC METABOLIC PANEL
Anion gap: 6 (ref 5–15)
BUN: 19 mg/dL (ref 6–20)
CO2: 31 mmol/L (ref 22–32)
Calcium: 9 mg/dL (ref 8.9–10.3)
Chloride: 98 mmol/L (ref 98–111)
Creatinine, Ser: 0.94 mg/dL (ref 0.61–1.24)
GFR calc Af Amer: >60 mL/min (ref >60)
GFR calc non Af Amer: >60 mL/min (ref >60)
Glucose, Bld: 110 mg/dL — ABNORMAL HIGH (ref 70–99)
Potassium: 4.4 mmol/L (ref 3.5–5.1)
Sodium: 135 mmol/L (ref 135–145)

## 2020-03-22 LAB — PROTIME-INR
INR: 3 — ABNORMAL HIGH (ref 0.8–1.2)
Prothrombin Time: 30.1 seconds — ABNORMAL HIGH (ref 11.4–15.2)

## 2020-03-22 MED ORDER — WARFARIN SODIUM 1 MG PO TABS
1.0000 mg | ORAL_TABLET | Freq: Once | ORAL | Status: AC
  Administered 2020-03-22: 1 mg via ORAL
  Filled 2020-03-22: qty 1

## 2020-03-22 MED ORDER — HYDROMORPHONE HCL 1 MG/ML IJ SOLN
0.5000 mg | Freq: Once | INTRAMUSCULAR | Status: AC
  Administered 2020-03-22: 0.5 mg via INTRAVENOUS
  Filled 2020-03-22: qty 1

## 2020-03-22 MED ORDER — FUROSEMIDE 10 MG/ML IJ SOLN
20.0000 mg | Freq: Every day | INTRAMUSCULAR | Status: DC
  Administered 2020-03-23: 20 mg via INTRAVENOUS
  Filled 2020-03-22: qty 2

## 2020-03-22 NOTE — Progress Notes (Signed)
Patient ID: Leonard Bentley, male   DOB: 07/03/1961, 59 y.o.   MRN: [id number] Postoperative Leonard Bentley 1 transmetatarsal amputation left foot.  Patient has 175 cc in the wound VAC canister.  Discussed the importance of strict nonweightbearing on the left lower extremity.  Patient may discharge to home when cleared by physical therapy.  Patient will need the portable Praveena plus pump for discharge.  Discussed the importance with the patient of not manipulating the dressing until follow-up in the office in 1 week.

## 2020-03-22 NOTE — Evaluation (Addendum)
Occupational Therapy Evaluation Patient Details Name: Leonard Bentley MRN: [id number] DOB: 05-10-1961 Today's Date: 03/22/2020    History of Present Illness Pt is a 59 y/o male admitted secondary to chest tightness. Pt found to have pulmonary edema with history of diastolic congestive heart.  Underwent Transmetatarsal amputation left foot with application Prevena wound VAC on 5/14; PMH including but not limited to HTN, chronic diastolic CHF, severe MR s/p mitral valve repair, recurrent PE, anxiety, non-Hodgkin's lymphoma, osteomyelitis of the left foot, and alcohol abuse.   Clinical Impression   Pt recently evaluated and d/c from OT services due to being independent. Since pt recent transmet amputation, he now is recommended for OT services. Pt currently requires min A for LB ADLs and min A for transfers off of low surfaces with RW. Pt presents with poor standing balance tolerance due to new NWB status for standing BADL. He continues to require significant cues to maintain NWB status with functional mobility. Pt did become tremulous and mildly dizzy after mobility. Given current status, recommend HHOT at d/c for continued progression of BADL. Will continue to follow per POC listed below.     Follow Up Recommendations  Home health OT;Supervision - Intermittent    Equipment Recommendations  3 in 1 bedside commode    Recommendations for Other Services       Precautions / Restrictions Precautions Precautions: Fall Precaution Comments: wound vac Restrictions Weight Bearing Restrictions: Yes LLE Weight Bearing: Non weight bearing Other Position/Activity Restrictions: Strict NWB per Ortho note      Mobility Bed Mobility Overal bed mobility: Needs Assistance Bed Mobility: Supine to Sit     Supine to sit: Supervision     General bed mobility comments: Incr time, Supervision/assist to manage IV and VAC cord in bedclothes  Transfers Overall transfer level: Needs assistance Equipment  used: Rolling walker (2 wheeled) Transfers: Sit to/from Stand Sit to Stand: Min guard;Min assist         General transfer comment: Cues for hand placement; minguard for safety with rise from bed, min assist to steady RW with rise from low commode    Balance Overall balance assessment: Needs assistance Sitting-balance support: No upper extremity supported;Feet supported Sitting balance-Leahy Scale: Good     Standing balance support: During functional activity Standing balance-Leahy Scale: Poor                             ADL either performed or assessed with clinical judgement   ADL Overall ADL's : Needs assistance/impaired Eating/Feeding: Set up;Sitting   Grooming: Min guard;Standing   Upper Body Bathing: Set up;Sitting   Lower Body Bathing: Set up;Sit to/from stand;Sitting/lateral leans   Upper Body Dressing : Independent   Lower Body Dressing: Minimal assistance;Sit to/from stand Lower Body Dressing Details (indicate cue type and reason): pt was able to don darco shoe, but needed min A to pull underwear past hips while maintaining standing balance with RW Toilet Transfer: Minimal assistance;Regular Toilet;Grab bars;RW Toilet Transfer Details (indicate cue type and reason): min A for boost off of low commode Toileting- Clothing Manipulation and Hygiene: Set up;Sitting/lateral lean;Sit to/from stand   Tub/ Shower Transfer: Minimal assistance;Ambulation;Shower seat;Rolling walker   Functional mobility during ADLs: Min guard;Cueing for safety;Rolling walker       Vision Patient Visual Report: No change from baseline       Perception     Praxis      Pertinent Vitals/Pain Pain Assessment: Faces Faces  Pain Scale: Hurts even more Pain Location: L foot Pain Descriptors / Indicators: Aching;Discomfort;Grimacing;Guarding Pain Intervention(s): Monitored during session;Repositioned     Hand Dominance Right   Extremity/Trunk Assessment Upper Extremity  Assessment Upper Extremity Assessment: Overall WFL for tasks assessed(did become tremulous after increased activity)   Lower Extremity Assessment Lower Extremity Assessment: Defer to PT evaluation       Communication Communication Communication: No difficulties   Cognition Arousal/Alertness: Awake/alert Behavior During Therapy: WFL for tasks assessed/performed Overall Cognitive Status: No family/caregiver present to determine baseline cognitive functioning Area of Impairment: Safety/judgement;Problem solving                         Safety/Judgement: Decreased awareness of deficits   Problem Solving: Requires verbal cues General Comments: increased cues for NWB and safety   General Comments       Exercises     Shoulder Instructions      Home Living Family/patient expects to be discharged to:: Private residence   Available Help at Discharge: Family;Friend(s);Available PRN/intermittently Type of Home: House Home Access: Stairs to enter [employer] of Steps: 3 Entrance Stairs-Rails: Right Home Layout: One level     Bathroom Shower/Tub: Teacher, early years/pre: Standard     Home Equipment: Environmental consultant - 2 wheels          Prior Functioning/Environment Level of Independence: Independent with assistive device(s)  Gait / Transfers Assistance Needed: ambulates with use of a cane              OT Problem List: Decreased knowledge of use of DME or AE;Decreased knowledge of precautions;Decreased activity tolerance;Impaired balance (sitting and/or standing);Pain;Decreased safety awareness      OT Treatment/Interventions: Self-care/ADL training;Therapeutic exercise;Patient/family education;Balance training;Energy conservation;Therapeutic activities;DME and/or AE instruction    OT Goals(Current goals can be found in the care plan section) Acute Rehab OT Goals Patient Stated Goal: for his foot to heal OT Goal Formulation: With patient Time  For Goal Achievement: 04/05/20 Potential to Achieve Goals: Good  OT Frequency: Min 2X/week   Barriers to D/C:            Co-evaluation PT/OT/SLP Co-Evaluation/Treatment: Yes Reason for Co-Treatment: To address functional/ADL transfers PT goals addressed during session: Mobility/safety with mobility OT goals addressed during session: ADL's and self-care;Proper use of Adaptive equipment and DME      AM-PAC OT "6 Clicks" Daily Activity     Outcome Measure Help from another person eating meals?: None Help from another person taking care of personal grooming?: None Help from another person toileting, which includes using toliet, bedpan, or urinal?: A Little Help from another person bathing (including washing, rinsing, drying)?: A Little Help from another person to put on and taking off regular upper body clothing?: None Help from another person to put on and taking off regular lower body clothing?: A Little 6 Click Score: 21   End of Session Equipment Utilized During Treatment: Gait belt;Rolling walker Nurse Communication: Mobility status  Activity Tolerance: Patient tolerated treatment well Patient left: in chair;with call bell/phone within reach  OT Visit Diagnosis: Other abnormalities of gait and mobility (R26.89);Unsteadiness on feet (R26.81);Pain Pain - Right/Left: Left Pain - part of body: Ankle and joints of foot                Time: [id number] OT Time Calculation (min): 34 min Charges:  OT General Charges $OT Visit: 1 Visit OT Evaluation $OT Eval Moderate Complexity: 1 [address],  MSOT, OTR/L Acute Rehabilitation Services [facility] Office Number: [phone] Pager: [place] 03/22/2020, 1:44 PM

## 2020-03-22 NOTE — Progress Notes (Signed)
ANTICOAGULATION CONSULT NOTE - Follow Up Consult  Pharmacy Consult for Coumadin Indication: h/o VTE  Allergies  Allergen Reactions  . Other Other (See Comments)    "Cactus" caused blisters on tongue (if prepared as food)    Patient Measurements: Height: 5' 9.5" (176.5 cm) Weight: 84.4 kg (186 lb) IBW/kg (Calculated) : 71.85   Vital Signs: Temp: 98.7 F (37.1 C) (05/15 0450) Temp Source: Oral (05/15 0450) BP: 114/90 (05/15 0450) Pulse Rate: 104 (05/15 0450)  Labs: Recent Labs    03/20/20 0246 03/20/20 0246 03/21/20 0623 03/22/20 0307  HGB 11.8*   < > 10.6* 10.8*  HCT 36.0*  --  33.5* 34.3*  PLT 278  --  230 246  LABPROT 32.7*  --  33.4* 30.1*  INR 3.3*  --  3.4* 3.0*  CREATININE 1.02  --  1.00 0.94   < > = values in this interval not displayed.    Estimated Creatinine Clearance: 85 mL/min (by C-G formula based on SCr of 0.94 mg/dL).  Assessment:  59 y/o male with history of VTE, MV repair on warfarin PTA with a goal INR of 2-3. Also, patient went into AF 5/12 PM. No CBC this morning, no bleeding noted. INR this morning is back in therapeutic range at 3. Will give a small dose of warfarin 1 mg this evening and check INR tomorrow morning.   Notably, patient had INR>10 on admit with PTA regimen and received Vitamin K.   Goal of Therapy:  INR 2-3 Monitor platelets by anticoagulation protocol: Yes   Plan:  Give warfarin 1 mg PO x1 this evening Daily INR, monitor for s/sx of bleeding  Agnes Lawrence, PharmD PGY1 Pharmacy Resident

## 2020-03-22 NOTE — Progress Notes (Signed)
Physical Therapy Treatment Patient Details Name: Leonard Bentley MRN: [id number] DOB: 1960-12-08 Today's Date: 03/22/2020    History of Present Illness Pt is a 59 y/o male admitted secondary to chest tightness. Pt found to have pulmonary edema with history of diastolic congestive heart.  Underwent Transmetatarsal amputation left foot with application Prevena wound VAC on 5/14; PMH including but not limited to HTN, chronic diastolic CHF, severe MR s/p mitral valve repair, recurrent PE, anxiety, non-Hodgkin's lymphoma, osteomyelitis of the left foot, and alcohol abuse.    PT Comments    Continuing work on functional mobility and activity tolerance;  Returned to trial the knee scooter; Leonard Bentley moved slowly and guardedly, but with no overt loss of balance; He indicated he really likes the knee walker/scooter;   We briefly discussed dc plans, and it is still unclear how much, if any, assist he will have at home   Follow Up Recommendations  Home health PT   Home health PT(not sure that his insurance status will cover HHPT) If home situation cannot support safe maintenance of VAC, and NWB LLE, will consider SNF        Equipment Recommendations  Wheelchair (measurements PT);Other (comment)(Will consider knee scooter/walker)    Recommendations for Other Services       Precautions / Restrictions Precautions Precautions: Fall Precaution Comments: wound vac Restrictions LLE Weight Bearing: Non weight bearing Other Position/Activity Restrictions: Strict NWB per Ortho note    Mobility  Bed Mobility Overal bed mobility: Needs Assistance Bed Mobility: Supine to Sit     Supine to sit: Supervision     General bed mobility comments: Incr time, Supervision/assist to manage IV and VAC cord in bedclothes  Transfers Overall transfer level: Needs assistance Equipment used: (knee scooter) Transfers: Sit to/from Stand Sit to Stand: Min assist         General transfer comment: Extra time to  porblem-solve through positioning to stand to knee scooter; min assist to steady; maintained NWB  Ambulation/Gait Ambulation/Gait assistance: Min guard;Min assist Gait Distance (Feet): 75 Feet Assistive device: (Knee scooter/knee walker)   Gait velocity: decreased   General Gait Details: Moved very slowly, with guarded steps and hands on brakes entire time; cues for technique and steering; fatigued post walk   Stairs             Wheelchair Mobility    Modified Rankin (Stroke Patients Only)       Balance     Sitting balance-Leahy Scale: Good       Standing balance-Leahy Scale: Poor                              Cognition Arousal/Alertness: Awake/alert Behavior During Therapy: WFL for tasks assessed/performed Overall Cognitive Status: No family/caregiver present to determine baseline cognitive functioning                                 General Comments: increased cues for NWB and safety      Exercises      General Comments        Pertinent Vitals/Pain Pain Assessment: Faces Faces Pain Scale: Hurts little more Pain Location: L foot Pain Descriptors / Indicators: Aching;Discomfort;Grimacing;Guarding Pain Intervention(s): Monitored during session;Patient requesting pain meds-RN notified    Home Living  Prior Function            PT Goals (current goals can now be found in the care plan section) Acute Rehab PT Goals Patient Stated Goal: for his foot to heal PT Goal Formulation: With patient Time For Goal Achievement: 04/05/20 Potential to Achieve Goals: Good Progress towards PT goals: Progressing toward goals    Frequency    Min 3X/week      PT Plan Current plan remains appropriate    Co-evaluation              AM-PAC PT "6 Clicks" Mobility   Outcome Measure  Help needed turning from your back to your side while in a flat bed without using bedrails?: None Help needed moving  from lying on your back to sitting on the side of a flat bed without using bedrails?: None Help needed moving to and from a bed to a chair (including a wheelchair)?: None Help needed standing up from a chair using your arms (e.g., wheelchair or bedside chair)?: A Little Help needed to walk in hospital room?: A Little Help needed climbing 3-5 steps with a railing? : A Lot 6 Click Score: 20    End of Session Equipment Utilized During Treatment: Gait belt Activity Tolerance: Patient tolerated treatment well Patient left: in bed;with call bell/phone within reach Nurse Communication: Mobility status PT Visit Diagnosis: Other abnormalities of gait and mobility (R26.89)     Time: 1535-1600 PT Time Calculation (min) (ACUTE ONLY): 25 min  Charges:  $Gait Training: 23-37 mins                     Roney Marion, [place]  Acute Rehabilitation Services Pager [phone] Office [place] 03/22/2020, 6:07 PM

## 2020-03-22 NOTE — Progress Notes (Signed)
Physical Therapy Note  Patient suffers from L transmetatarsal amputation, now NWB LLE which impairs their ability to perform daily activities like walking, bathing in the home.  A walker alone will not resolve the issues with performing activities of daily living. A lightweight wheelchair will allow patient to safely perform daily activities.  The patient can self propel in the home or has a caregiver who can provide assistance.     Roney Marion, [place]  Acute Rehabilitation Services Pager [phone] Office [phone]

## 2020-03-22 NOTE — Progress Notes (Signed)
PROGRESS NOTE  Leonard Bentley [id number] DOB: 1961-06-02 DOA: 03/18/2020 PCP: [name], NP  HPI/Recap of past 24 hours: HPI from Dr [name] Leonard Bentley is Leonard 59 y.o. male with medical history significant of HTN, chronic diastolic CHF, severe MR s/p mitral valve repair, recurrent PE, anxiety, non-Hodgkin's lymphoma, osteomyelitis of the left foot, and alcohol abuse presents with complaints of 1 week of chest tightness and shortness of breath.  He has had Leonard dry nonproductive cough.  Associated symptoms include complaints of leg swelling.  He reports having worsening pins on needles sensation in both of his feet.  Last hospitalized in March for left foot wound dehiscence with concern for possible osteomyelitis s/p left third ray amputation by Dr. [name].  He had been wearing compression stockings has advised by Dr. [name].  Notes he had been cleared to start bearing weight on his left foot, but noted that he had not been able to get around much due to the pain.  The wound had been closed to his knowledge and he had not noticed any drainage or redness.  Denies having any significant fever or chills. In the ED, heart rates elevated up into the 140s temporarily, blood pressures elevated to 133/106, and all other vital signs maintained.  Labs significant for WBC 3.1, hemoglobin 11, troponins negative x2, BNP WNL and INR noted to be supra therapeutic.  Chest x-ray significant for mild interstitial edema.  Patient was given 40 mg of Lasix IV.  TRH called to admit.     Today, patient complained of postop left foot pain, otherwise no new complaints.  Patient denies any worsening shortness of breath, chest pain, abdominal pain, nausea/vomiting.  Patient had Leonard low-grade temp of 100.4 this Leonard.m.     Assessment/Plan: Principal Problem:   Pulmonary edema Active Problems:   Alcohol abuse   Cigarette smoker   Personal history of pulmonary embolism   Neuropathy due to chemotherapeutic drug ([place])   Anxiety  S/P minimally-invasive mitral valve repair   Supratherapeutic INR   Subacute osteomyelitis, left ankle and foot (HCC)   Wound dehiscence, surgical, initial encounter   Microcytic hypochromic anemia   Chest pain   Pulmonary edema/?Acute on chronic diastolic HF/History of mitral valve repair Currently on room air BNP WNL, Troponins negative Chest x-ray significant for mild interstitial edema Echo showed EF noted to be 50-55% Strict intake and output, daily weight Continue IV diuresis for now Follow-up with cardiology as an outpatient Telemetry  Supratherapeutic INR, R eye conjunctival hemorrhage On admission patient noted to have INR greater than 10 S/p 2.5 g of vitamin K p.o. Daily monitoring of PT/INR Pharmacy to dose  Wound dehiscence of left foot, history of osteomyelitis s/p transmetatarsal amputation with application of wound VAC on 03/21/2020 by Dr. [name] Fever spike of 100.4 on 03/22/2020, with no leukocytosis Patient has history of osteomyelitis of the left foot for which he just recently had Leonard third ray amputation by Dr. [name] on 01/30/20 Noted mild dehiscence of the wound, but no signs of discharge CRP WNL, ESR minimally elevated  X-rays of the left foot: Findings are suspicious for osteomyelitis  Continue oxycodone as needed for pain Dr. [name] consulted, appreciate recs  Alcohol abuse, with possible withdrawal Patient still reports drinking Leonard bottle of wine daily. His last drink was PTA Counseled on need for cessation of alcohol abuse CIWA protocols with scheduled Ativan  Recurrent PEs on chronic anticoagulation D-dimer negative Patient with history of recurrent PEs on anticoagulation  of Coumadin Coumadin per pharmacy  Neuropathy related with chemotherapy, history of Non-Hodgkin's lymphoma Patient with history of neuropathy thought to be related with chemotherapy drugs to treat non-Hodgkin's lymphoma Previously had been treated with systemic chemotherapy of CHOP  and Rituxan last given in 11/2011 Continue gabapentin  Anxiety Continue Xanax as needed  Tobacco abuse Continue nicotine patch       Malnutrition Type:      Malnutrition Characteristics:      Nutrition Interventions:       Estimated body mass index is 27.07 kg/m as calculated from the following:   Height as of this encounter: 5' 9.5" (1.765 m).   Weight as of this encounter: 84.4 kg.     Code Status: Full  Family Communication: Discussed extensively with patient  Disposition Plan: Status is: Inpatient  Remains inpatient appropriate because:Inpatient level of care appropriate due to severity of illness   Dispo: The patient is from: Home              Anticipated d/c is to: Home Vs SNF              Anticipated d/c date is: 2 days              Patient currently is not medically stable to d/c.  Still needs follow-up with PT as inpatient as requested.  Still requiring IV narcotics  Consultants:  Dr. [name]   Procedures:  Left foot transmetatarsal amputation on 03/21/2020  Antimicrobials:  Ancef for surgical prophylaxis  DVT prophylaxis: Coumadin    Objective: Vitals:   03/22/20 0132 03/22/20 0450 03/22/20 1040 03/22/20 1216  BP: 114/90 114/90 114/87 127/80  Pulse: 99 (!) 104 100 (!) 104  Resp: '17 18  18  '$ Temp: 98.5 F (36.9 C) 98.7 F (37.1 C)  (!) 100.4 F (38 C)  TempSrc: Oral Oral  Oral  SpO2: 100% 97%  92%  Weight:      Height:        Intake/Output Summary (Last 24 hours) at 03/22/2020 1510 Last data filed at 03/22/2020 0548 Gross per 24 hour  Intake 740 ml  Output 325 ml  Net 415 ml   Filed Weights   03/20/20 0535 03/21/20 0512 03/21/20 1455  Weight: 84.1 kg 85.5 kg 84.4 kg    Exam:  General: NAD, tremor present  Cardiovascular: S1, S2 present  Respiratory:  Diminished breath sounds bilaterally  Abdomen: Soft, nontender, nondistended, bowel sounds present  Musculoskeletal: No bilateral pedal edema noted, dressing of left  foot C/D/I, wound VAC noted  Skin:  As above  Psychiatry: Normal mood    Data Reviewed: CBC: Recent Labs  Lab 03/18/20 0207 03/19/20 0710 03/20/20 0246 03/21/20 0623 03/22/20 0307  WBC 3.1* 5.2 4.8 3.6* 6.8  NEUTROABS  --   --  3.1 2.1 5.5  HGB 11.0* 13.1 11.8* 10.6* 10.8*  HCT 35.0* 40.9 36.0* 33.5* 34.3*  MCV 78.7* 77.2* 77.1* 78.3* 79.0*  PLT 270 325 278 230 211   Basic Metabolic Panel: Recent Labs  Lab 03/18/20 0207 03/19/20 0710 03/20/20 0246 03/21/20 0623 03/22/20 0307  NA 142 138 137 136 135  K 4.2 3.9 3.5 3.8 4.4  CL 108 98 98 99 98  CO2 '23 26 27 28 31  '$ GLUCOSE 95 119* 125* 98 110*  BUN 8 13 22* 25* 19  CREATININE 0.64 0.93 1.02 1.00 0.94  CALCIUM 8.9 9.5 9.0 9.1 9.0  MG  --  1.6*  --   --   --  PHOS  --  4.5  --   --   --    GFR: Estimated Creatinine Clearance: 85 mL/min (by C-G formula based on SCr of 0.94 mg/dL). Liver Function Tests: No results for input(s): AST, ALT, ALKPHOS, BILITOT, PROT, ALBUMIN in the last 168 hours. No results for input(s): LIPASE, AMYLASE in the last 168 hours. No results for input(s): AMMONIA in the last 168 hours. Coagulation Profile: Recent Labs  Lab 03/18/20 0207 03/19/20 0710 03/20/20 0246 03/21/20 0623 03/22/20 0307  INR >10.0* 4.4* 3.3* 3.4* 3.0*   Cardiac Enzymes: No results for input(s): CKTOTAL, CKMB, CKMBINDEX, TROPONINI in the last 168 hours. BNP (last 3 results) No results for input(s): PROBNP in the last 8760 hours. HbA1C: No results for input(s): HGBA1C in the last 72 hours. CBG: No results for input(s): GLUCAP in the last 168 hours. Lipid Profile: No results for input(s): CHOL, HDL, LDLCALC, TRIG, CHOLHDL, LDLDIRECT in the last 72 hours. Thyroid Function Tests: No results for input(s): TSH, T4TOTAL, FREET4, T3FREE, THYROIDAB in the last 72 hours. Anemia Panel: No results for input(s): VITAMINB12, FOLATE, FERRITIN, TIBC, IRON, RETICCTPCT in the last 72 hours. Urine analysis:    Component  Value Date/Time   COLORURINE YELLOW 01/27/2020 1841   APPEARANCEUR CLEAR 01/27/2020 1841   APPEARANCEUR Clear 08/03/2019 1143   LABSPEC 1.015 01/27/2020 1841   PHURINE 6.0 01/27/2020 1841   GLUCOSEU NEGATIVE 01/27/2020 1841   HGBUR SMALL (Leonard) 01/27/2020 1841   BILIRUBINUR NEGATIVE 01/27/2020 1841   BILIRUBINUR small (Leonard) 08/03/2019 1144   BILIRUBINUR Negative 08/03/2019 1143   KETONESUR 5 (Leonard) 01/27/2020 1841   PROTEINUR 30 (Leonard) 01/27/2020 1841   UROBILINOGEN 1.0 08/03/2019 1144   UROBILINOGEN 1.0 01/25/2013 0825   NITRITE NEGATIVE 01/27/2020 1841   LEUKOCYTESUR NEGATIVE 01/27/2020 1841   Sepsis Labs: '@LABRCNTIP'$ (procalcitonin:4,lacticidven:4)  ) Recent Results (from the past 240 hour(s))  SARS CORONAVIRUS 2 (TAT 6-24 HRS) Nasopharyngeal Nasopharyngeal Swab     Status: None   Collection Time: 03/18/20  6:21 AM   Specimen: Nasopharyngeal Swab  Result Value Ref Range Status   SARS Coronavirus 2 NEGATIVE NEGATIVE Final    Comment: (NOTE) SARS-CoV-2 target nucleic acids are NOT DETECTED. The SARS-CoV-2 RNA is generally detectable in upper and lower respiratory specimens during the acute phase of infection. Negative results do not preclude SARS-CoV-2 infection, do not rule out co-infections with other pathogens, and should not be used as the sole basis for treatment or other patient management decisions. Negative results must be combined with clinical observations, patient history, and epidemiological information. The expected result is Negative. Fact Sheet for Patients: SugarRoll.be Fact Sheet for Healthcare Providers: https://www.woods-mathews.com/ This test is not yet approved or cleared by the [place] FDA and  has been authorized for detection and/or diagnosis of SARS-CoV-2 by FDA under an Emergency Use Authorization (EUA). This EUA will remain  in effect (meaning this test can be used) for the duration of the COVID-19 declaration  under Section 56 4(b)(1) of the Act, 21 U.S.C. section 360bbb-3(b)(1), unless the authorization is terminated or revoked sooner. Performed at [facility], [place] [address], [place], [place] [address]   Surgical pcr screen     Status: Abnormal   Collection Time: 03/20/20  6:54 PM   Specimen: Nasal Mucosa; Nasal Swab  Result Value Ref Range Status   MRSA, PCR POSITIVE (Leonard) NEGATIVE Final    Comment: RESULT CALLED TO, READ BACK BY AND VERIFIED WITH: K.GOAD,RN AT 2044 12/22/19 BY L.PITT  Staphylococcus aureus POSITIVE (Leonard) NEGATIVE Final    Comment: (NOTE) The Xpert SA Assay (FDA approved for NASAL specimens in patients 53 years of age and older), is one component of Leonard comprehensive surveillance program. It is not intended to diagnose infection nor to guide or monitor treatment.       Studies: No results found.  Scheduled Meds: . docusate sodium  100 mg Oral BID  . folic acid  1 mg Oral Daily  . furosemide  40 mg Intravenous Daily  . gabapentin  100 mg Oral TID  . metoprolol succinate  50 mg Oral Daily  . multivitamin with minerals  1 tablet Oral Daily  . mupirocin ointment   Nasal BID  . nicotine  21 mg Transdermal Daily  . sodium chloride flush  3 mL Intravenous Once  . sodium chloride flush  3 mL Intravenous Q12H  . thiamine  100 mg Oral Daily   Or  . thiamine  100 mg Intravenous Daily  . warfarin  1 mg Oral ONCE-1600  . Warfarin - Pharmacist Dosing Inpatient   Does not apply q1600    Continuous Infusions: . sodium chloride    . sodium chloride 75 mL/hr at 03/21/20 1755     LOS: 3 days     Alma Friendly, MD [facility]  If 7PM-7AM, please contact night-coverage [email or web address] 03/22/2020, 3:10 PM

## 2020-03-22 NOTE — Progress Notes (Signed)
Physical Therapy Treatment Patient Details Name: Leonard Bentley MRN: [id number] DOB: 09-Feb-1961 Today's Date: 03/22/2020    History of Present Illness Pt is a 59 y/o male admitted secondary to chest tightness. Pt found to have pulmonary edema with history of diastolic congestive heart.  Underwent Transmetatarsal amputation left foot with application Prevena wound VAC on 5/14; PMH including but not limited to HTN, chronic diastolic CHF, severe MR s/p mitral valve repair, recurrent PE, anxiety, non-Hodgkin's lymphoma, osteomyelitis of the left foot, and alcohol abuse.    PT Comments    Continuing work on functional mobility and activity tolerance;  Now s/p L transmetatarsal amputation, strict NWB; Overall with cueing and practice, he was able to maintain NWB LLE with RW and min/minguard assist; Will plan to try knee scooter next session, and I believe he will do well; He became mildly shaky after about 20 feet of walking with RW, and reported some lightheadedness -- will consider Orthostatics next session as well;  I anticipate good progress, and that he will be able to dc home; still, he hasn't been forthcoming re: details of his home situation with PT;   Would request 1-2 more days inpatient to trial knee scooter, problem-solve stair negotiation, and find out more about home situation; have communicated this with Dr. Horris Latino and the team.  Follow Up Recommendations  Home health PT(not sure that his insurance status will cover HHPT) If home situation cannot support safe maintenance of VAC, and NWB LLE, will consider SNF     Equipment Recommendations  Wheelchair (measurements PT);Other (comment)(Will consider knee scooter/walker)    Recommendations for Other Services       Precautions / Restrictions Precautions Precautions: Fall Restrictions Weight Bearing Restrictions: Yes LLE Weight Bearing: Non weight bearing Other Position/Activity Restrictions: Strict NWB per Ortho note     Mobility  Bed Mobility Overal bed mobility: Modified Independent;Needs Assistance Bed Mobility: Supine to Sit     Supine to sit: Supervision     General bed mobility comments: Incr time, Supervision/assist to manage IV and VAC cord in bedclothes  Transfers Overall transfer level: Needs assistance Equipment used: Rolling walker (2 wheeled) Transfers: Sit to/from Stand Sit to Stand: Min guard;Min assist         General transfer comment: Cues for hand placement; minguard for safety with rise from bed, min assist to steady RW with rise from low commode  Ambulation/Gait Ambulation/Gait assistance: Min guard;Min assist Gait Distance (Feet): 20 Feet Assistive device: Rolling walker (2 wheeled) Gait Pattern/deviations: (hop-to pattern)     General Gait Details: Initially touching down L foot, but able to maintain NWB with the cue hold his foot up "as if practicing for a knee scooter"; Noted fatigue and arm shaking with incr time in standing   Stairs             Wheelchair Mobility    Modified Rankin (Stroke Patients Only)       Balance             Standing balance-Leahy Scale: Poor(approaching Fair)                              Cognition Arousal/Alertness: Awake/alert Behavior During Therapy: WFL for tasks assessed/performed Overall Cognitive Status: Within Functional Limits for tasks assessed(for simple mobility)  General Comments: It took a few reminders, but able to [facility] NWB with RW      Exercises      General Comments        Pertinent Vitals/Pain Pain Assessment: Faces Faces Pain Scale: Hurts even more Pain Location: L foot Pain Descriptors / Indicators: Aching;Discomfort;Grimacing;Guarding Pain Intervention(s): Monitored during session;RN gave pain meds during session    Home Living                      Prior Function            PT Goals (current goals can now  be found in the care plan section) Acute Rehab PT Goals Patient Stated Goal: for his foot to heal PT Goal Formulation: With patient Time For Goal Achievement: 04/05/20 Potential to Achieve Goals: Good Progress towards PT goals: Progressing toward goals    Frequency    Min 3X/week      PT Plan Discharge plan needs to be updated;Equipment recommendations need to be updated    Co-evaluation PT/OT/SLP Co-Evaluation/Treatment: Yes Reason for Co-Treatment: Other (comment)(for multidisciplinary approach to dc planning) PT goals addressed during session: Mobility/safety with mobility        AM-PAC PT "6 Clicks" Mobility   Outcome Measure  Help needed turning from your back to your side while in a flat bed without using bedrails?: None Help needed moving from lying on your back to sitting on the side of a flat bed without using bedrails?: None Help needed moving to and from a bed to a chair (including a wheelchair)?: None Help needed standing up from a chair using your arms (e.g., wheelchair or bedside chair)?: A Little Help needed to walk in hospital room?: A Little Help needed climbing 3-5 steps with a railing? : A Lot 6 Click Score: 20    End of Session Equipment Utilized During Treatment: Gait belt Activity Tolerance: Patient tolerated treatment well Patient left: in chair;with call bell/phone within reach Nurse Communication: Mobility status PT Visit Diagnosis: Other abnormalities of gait and mobility (R26.89)     Time: [id number] PT Time Calculation (min) (ACUTE ONLY): 32 min  Charges:  $Gait Training: 8-22 mins                     Roney Marion, PT  Acute Rehabilitation Services Pager [phone] Office [place] 03/22/2020, 12:53 PM

## 2020-03-23 LAB — CBC WITH DIFFERENTIAL/PLATELET
Abs Immature Granulocytes: 0.02 10*3/uL (ref 0.00–0.07)
Basophils Absolute: 0 10*3/uL (ref 0.0–0.1)
Basophils Relative: 0 %
Eosinophils Absolute: 0.2 10*3/uL (ref 0.0–0.5)
Eosinophils Relative: 3 %
HCT: 31.4 % — ABNORMAL LOW (ref 39.0–52.0)
Hemoglobin: 9.8 g/dL — ABNORMAL LOW (ref 13.0–17.0)
Immature Granulocytes: 0 %
Lymphocytes Relative: 10 %
Lymphs Abs: 0.7 10*3/uL (ref 0.7–4.0)
MCH: 24.9 pg — ABNORMAL LOW (ref 26.0–34.0)
MCHC: 31.2 g/dL (ref 30.0–36.0)
MCV: 79.9 fL — ABNORMAL LOW (ref 80.0–100.0)
Monocytes Absolute: 1.2 10*3/uL — ABNORMAL HIGH (ref 0.1–1.0)
Monocytes Relative: 18 %
Neutro Abs: 4.4 10*3/uL (ref 1.7–7.7)
Neutrophils Relative %: 69 %
Platelets: 180 10*3/uL (ref 150–400)
RBC: 3.93 MIL/uL — ABNORMAL LOW (ref 4.22–5.81)
RDW: 14.1 % (ref 11.5–15.5)
WBC: 6.5 10*3/uL (ref 4.0–10.5)
nRBC: 0 % (ref 0.0–0.2)

## 2020-03-23 LAB — BASIC METABOLIC PANEL
Anion gap: 12 (ref 5–15)
BUN: 12 mg/dL (ref 6–20)
CO2: 25 mmol/L (ref 22–32)
Calcium: 9.1 mg/dL (ref 8.9–10.3)
Chloride: 98 mmol/L (ref 98–111)
Creatinine, Ser: 0.86 mg/dL (ref 0.61–1.24)
GFR calc Af Amer: >60 mL/min (ref >60)
GFR calc non Af Amer: >60 mL/min (ref >60)
Glucose, Bld: 109 mg/dL — ABNORMAL HIGH (ref 70–99)
Potassium: 4 mmol/L (ref 3.5–5.1)
Sodium: 135 mmol/L (ref 135–145)

## 2020-03-23 LAB — PROTIME-INR
INR: 2.6 — ABNORMAL HIGH (ref 0.8–1.2)
Prothrombin Time: 26.7 seconds — ABNORMAL HIGH (ref 11.4–15.2)

## 2020-03-23 MED ORDER — WARFARIN SODIUM 3 MG PO TABS
3.0000 mg | ORAL_TABLET | Freq: Once | ORAL | Status: AC
  Administered 2020-03-23: 3 mg via ORAL
  Filled 2020-03-23: qty 1

## 2020-03-23 NOTE — Progress Notes (Signed)
PROGRESS NOTE  Leonard Bentley CXK:[id number] DOB: 1961/04/08 DOA: 03/18/2020 PCP: [name], NP  HPI/Recap of past 24 hours: HPI from Dr Leonard Bentley A [name] is a 59 y.o. male with medical history significant of HTN, chronic diastolic CHF, severe MR s/p mitral valve repair, recurrent PE, anxiety, non-Hodgkin's lymphoma, osteomyelitis of the left foot, and alcohol abuse presents with complaints of 1 week of chest tightness and shortness of breath.  He has had a dry nonproductive cough.  Associated symptoms include complaints of leg swelling.  He reports having worsening pins on needles sensation in both of his feet.  Last hospitalized in March for left foot wound dehiscence with concern for possible osteomyelitis s/p left third ray amputation by Dr. [name].  He had been wearing compression stockings has advised by Dr. [name].  Notes he had been cleared to start bearing weight on his left foot, but noted that he had not been able to get around much due to the pain.  The wound had been closed to his knowledge and he had not noticed any drainage or redness.  Denies having any significant fever or chills. In the ED, heart rates elevated up into the 140s temporarily, blood pressures elevated to 133/106, and all other vital signs maintained.  Labs significant for WBC 3.1, hemoglobin 11, troponins negative x2, BNP WNL and INR noted to be supra therapeutic.  Chest x-ray significant for mild interstitial edema.  Patient was given 40 mg of Lasix IV.  TRH called to admit.    Today, patient still complaining of postop pain around the left foot, denies any chest pain, shortness of breath, abdominal pain, nausea/vomiting, fever/chills.     Assessment/Plan: Principal Problem:   Pulmonary edema Active Problems:   Alcohol abuse   Cigarette smoker   Personal history of pulmonary embolism   Neuropathy due to chemotherapeutic drug ([place])   Anxiety   S/P minimally-invasive mitral valve repair   Supratherapeutic  INR   Subacute osteomyelitis, left ankle and foot (HCC)   Wound dehiscence, surgical, initial encounter   Microcytic hypochromic anemia   Chest pain   Pulmonary edema/?Acute on chronic diastolic HF/History of mitral valve repair Currently on room air BNP WNL, Troponins negative Chest x-ray significant for mild interstitial edema Echo showed EF noted to be 50-55% Strict intake and output, daily weight Continue IV diuresis for now Follow-up with cardiology as an outpatient Telemetry  Supratherapeutic INR, R eye conjunctival hemorrhage On admission patient noted to have INR greater than 10 S/p 2.5 g of vitamin K p.o. Daily monitoring of PT/INR Pharmacy to dose  Wound dehiscence of left foot, history of osteomyelitis s/p transmetatarsal amputation with application of wound VAC on 03/21/2020 by Dr. [name] Fever spike of 100.4 on 03/22/2020, with no leukocytosis Patient has history of osteomyelitis of the left foot for which he just recently had a third ray amputation by Dr. [name] on 01/30/20 CRP WNL, ESR minimally elevated  X-rays of the left foot: Findings are suspicious for osteomyelitis  Continue oxycodone as needed for pain Dr. [name] consulted, appreciate recs  Alcohol abuse, with possible withdrawal Patient still reports drinking a bottle of wine daily. His last drink was PTA Counseled on need for cessation of alcohol abuse CIWA protocols with scheduled Ativan  Recurrent PEs on chronic anticoagulation D-dimer negative Patient with history of recurrent PEs on anticoagulation of Coumadin Coumadin per pharmacy  Neuropathy related with chemotherapy, history of Non-Hodgkin's lymphoma Patient with history of neuropathy thought to be related  with chemotherapy drugs to treat non-Hodgkin's lymphoma Previously had been treated with systemic chemotherapy of CHOP and Rituxan last given in 11/2011 Continue gabapentin  Anxiety Continue Xanax as needed  Tobacco abuse Continue nicotine  patch       Malnutrition Type:      Malnutrition Characteristics:      Nutrition Interventions:       Estimated body mass index is 27.07 kg/m as calculated from the following:   Height as of this encounter: 5' 9.5" (1.765 m).   Weight as of this encounter: 84.4 kg.     Code Status: Full  Family Communication: Discussed extensively with patient  Disposition Plan: Status is: Inpatient  Remains inpatient appropriate because:Inpatient level of care appropriate due to severity of illness   Dispo: The patient is from: Home              Anticipated d/c is to: Home Vs SNF              Anticipated d/c date is: 2 days              Patient currently is not medically stable to d/c.  Still needs follow-up with PT as inpatient as requested.  Still requiring IV narcotics  Consultants:  Dr. [name]   Procedures:  Left foot transmetatarsal amputation on 03/21/2020  Antimicrobials:  Ancef for surgical prophylaxis  DVT prophylaxis: Coumadin    Objective: Vitals:   03/23/20 0521 03/23/20 1011 03/23/20 1243 03/23/20 1333  BP: 112/80 110/81 (!) 88/76 112/81  Pulse: (!) 101 (!) 107 100 93  Resp: '19 18 20 16  '$ Temp: 99.5 F (37.5 C) 98.4 F (36.9 C) 98 F (36.7 C)   TempSrc: Oral Oral Oral   SpO2: 99%  96% 95%  Weight:      Height:        Intake/Output Summary (Last 24 hours) at 03/23/2020 1506 Last data filed at 03/23/2020 1014 Gross per 24 hour  Intake 1275 ml  Output 50 ml  Net 1225 ml   Filed Weights   03/20/20 0535 03/21/20 0512 03/21/20 1455  Weight: 84.1 kg 85.5 kg 84.4 kg    Exam:  General: NAD, tremor present  Cardiovascular: S1, S2 present  Respiratory:  Diminished breath sounds bilaterally  Abdomen: Soft, nontender, nondistended, bowel sounds present  Musculoskeletal: No bilateral pedal edema noted, dressing of left foot C/D/I, wound VAC noted  Skin:  As above  Psychiatry: Normal mood    Data Reviewed: CBC: Recent Labs  Lab  03/19/20 0710 03/20/20 0246 03/21/20 0623 03/22/20 0307 03/23/20 0704  WBC 5.2 4.8 3.6* 6.8 6.5  NEUTROABS  --  3.1 2.1 5.5 4.4  HGB 13.1 11.8* 10.6* 10.8* 9.8*  HCT 40.9 36.0* 33.5* 34.3* 31.4*  MCV 77.2* 77.1* 78.3* 79.0* 79.9*  PLT 325 278 230 246 992   Basic Metabolic Panel: Recent Labs  Lab 03/19/20 0710 03/20/20 0246 03/21/20 0623 03/22/20 0307 03/23/20 0704  NA 138 137 136 135 135  K 3.9 3.5 3.8 4.4 4.0  CL 98 98 99 98 98  CO2 '26 27 28 31 25  '$ GLUCOSE 119* 125* 98 110* 109*  BUN 13 22* 25* 19 12  CREATININE 0.93 1.02 1.00 0.94 0.86  CALCIUM 9.5 9.0 9.1 9.0 9.1  MG 1.6*  --   --   --   --   PHOS 4.5  --   --   --   --    GFR: Estimated Creatinine Clearance: 92.9 mL/min (by  C-G formula based on SCr of 0.86 mg/dL). Liver Function Tests: No results for input(s): AST, ALT, ALKPHOS, BILITOT, PROT, ALBUMIN in the last 168 hours. No results for input(s): LIPASE, AMYLASE in the last 168 hours. No results for input(s): AMMONIA in the last 168 hours. Coagulation Profile: Recent Labs  Lab 03/19/20 0710 03/20/20 0246 03/21/20 0623 03/22/20 0307 03/23/20 0704  INR 4.4* 3.3* 3.4* 3.0* 2.6*   Cardiac Enzymes: No results for input(s): CKTOTAL, CKMB, CKMBINDEX, TROPONINI in the last 168 hours. BNP (last 3 results) No results for input(s): PROBNP in the last 8760 hours. HbA1C: No results for input(s): HGBA1C in the last 72 hours. CBG: No results for input(s): GLUCAP in the last 168 hours. Lipid Profile: No results for input(s): CHOL, HDL, LDLCALC, TRIG, CHOLHDL, LDLDIRECT in the last 72 hours. Thyroid Function Tests: No results for input(s): TSH, T4TOTAL, FREET4, T3FREE, THYROIDAB in the last 72 hours. Anemia Panel: No results for input(s): VITAMINB12, FOLATE, FERRITIN, TIBC, IRON, RETICCTPCT in the last 72 hours. Urine analysis:    Component Value Date/Time   COLORURINE YELLOW 01/27/2020 1841   APPEARANCEUR CLEAR 01/27/2020 1841   APPEARANCEUR Clear 08/03/2019  1143   LABSPEC 1.015 01/27/2020 1841   PHURINE 6.0 01/27/2020 1841   GLUCOSEU NEGATIVE 01/27/2020 1841   HGBUR SMALL (A) 01/27/2020 1841   BILIRUBINUR NEGATIVE 01/27/2020 1841   BILIRUBINUR small (A) 08/03/2019 1144   BILIRUBINUR Negative 08/03/2019 1143   KETONESUR 5 (A) 01/27/2020 1841   PROTEINUR 30 (A) 01/27/2020 1841   UROBILINOGEN 1.0 08/03/2019 1144   UROBILINOGEN 1.0 01/25/2013 0825   NITRITE NEGATIVE 01/27/2020 1841   LEUKOCYTESUR NEGATIVE 01/27/2020 1841   Sepsis Labs: '@LABRCNTIP'$ (procalcitonin:4,lacticidven:4)  ) Recent Results (from the past 240 hour(s))  SARS CORONAVIRUS 2 (TAT 6-24 HRS) Nasopharyngeal Nasopharyngeal Swab     Status: None   Collection Time: 03/18/20  6:21 AM   Specimen: Nasopharyngeal Swab  Result Value Ref Range Status   SARS Coronavirus 2 NEGATIVE NEGATIVE Final    Comment: (NOTE) SARS-CoV-2 target nucleic acids are NOT DETECTED. The SARS-CoV-2 RNA is generally detectable in upper and lower respiratory specimens during the acute phase of infection. Negative results do not preclude SARS-CoV-2 infection, do not rule out co-infections with other pathogens, and should not be used as the sole basis for treatment or other patient management decisions. Negative results must be combined with clinical observations, patient history, and epidemiological information. The expected result is Negative. Fact Sheet for Patients: SugarRoll.be Fact Sheet for Healthcare Providers: https://www.woods-mathews.com/ This test is not yet approved or cleared by the [place] FDA and  has been authorized for detection and/or diagnosis of SARS-CoV-2 by FDA under an Emergency Use Authorization (EUA). This EUA will remain  in effect (meaning this test can be used) for the duration of the COVID-19 declaration under Section 56 4(b)(1) of the Act, 21 U.S.C. section 360bbb-3(b)(1), unless the authorization is terminated or revoked  sooner. Performed at [facility], [place] [address], [place], [place] [address]   Surgical pcr screen     Status: Abnormal   Collection Time: 03/20/20  6:54 PM   Specimen: Nasal Mucosa; Nasal Swab  Result Value Ref Range Status   MRSA, PCR POSITIVE (A) NEGATIVE Final    Comment: RESULT CALLED TO, READ BACK BY AND VERIFIED WITH: K.GOAD,RN AT 2044 12/22/19 BY L.PITT    Staphylococcus aureus POSITIVE (A) NEGATIVE Final    Comment: (NOTE) The Xpert SA Assay (FDA approved for NASAL specimens in patients 22  years of age and older), is one component of a comprehensive surveillance program. It is not intended to diagnose infection nor to guide or monitor treatment.       Studies: No results found.  Scheduled Meds: . docusate sodium  100 mg Oral BID  . folic acid  1 mg Oral Daily  . furosemide  20 mg Intravenous Daily  . gabapentin  100 mg Oral TID  . metoprolol succinate  50 mg Oral Daily  . multivitamin with minerals  1 tablet Oral Daily  . mupirocin ointment   Nasal BID  . nicotine  21 mg Transdermal Daily  . sodium chloride flush  3 mL Intravenous Once  . sodium chloride flush  3 mL Intravenous Q12H  . thiamine  100 mg Oral Daily   Or  . thiamine  100 mg Intravenous Daily  . warfarin  3 mg Oral ONCE-1600  . Warfarin - Pharmacist Dosing Inpatient   Does not apply q1600    Continuous Infusions: . sodium chloride       LOS: 4 days     Alma Friendly, MD [facility]  If 7PM-7AM, please contact night-coverage [email or web address] 03/23/2020, 3:06 PM

## 2020-03-23 NOTE — Progress Notes (Signed)
ANTICOAGULATION CONSULT NOTE - Follow Up Consult  Pharmacy Consult for Coumadin Indication: h/o VTE  Allergies  Allergen Reactions  . Other Other (See Comments)    "Cactus" caused blisters on tongue (if prepared as food)    Patient Measurements: Height: 5' 9.5" (176.5 cm) Weight: 84.4 kg (186 lb) IBW/kg (Calculated) : 71.85   Vital Signs: Temp: 99.5 F (37.5 C) (05/16 0521) Temp Source: Oral (05/16 0521) BP: 112/80 (05/16 0521) Pulse Rate: 101 (05/16 0521)  Labs: Recent Labs    03/21/20 0623 03/21/20 0623 03/22/20 0307 03/23/20 0704  HGB 10.6*   < > 10.8* 9.8*  HCT 33.5*  --  34.3* 31.4*  PLT 230  --  246 180  LABPROT 33.4*  --  30.1* 26.7*  INR 3.4*  --  3.0* 2.6*  CREATININE 1.00  --  0.94 0.86   < > = values in this interval not displayed.    Estimated Creatinine Clearance: 92.9 mL/min (by C-G formula based on SCr of 0.86 mg/dL).  Assessment:  59 y/o male with history of VTE, MV repair on warfarin PTA with a goal INR of 2-3. Also, patient went into AF 5/12 PM. Hemoglobin this morning down to 9.8, Platelet count also down from 246 to 180. INR is perfectly therapeutic today at 2.6. No bleeding noted or per the RN. The RN states the patient is having some nausea today and will check again for any s/sx of bleeding prior to giving scheduled dose of warfarin this afternoon. Will increase doses cautiously as needed to maintain INR goal and limit risk of bleeding.   Notably, patient had INR>10 on admit with PTA regimen 7.5mg  daily and received Vitamin K.   Goal of Therapy:  INR 2-3 Monitor platelets by anticoagulation protocol: Yes   Plan:  Give warfarin 3 mg PO x1 this evening Daily INR, monitor for s/sx of bleeding  Agnes Lawrence, PharmD PGY1 Pharmacy Resident

## 2020-03-23 NOTE — Plan of Care (Signed)
  Problem: Education: Goal: Knowledge of General Education information will improve Description: Including pain rating scale, medication(s)/side effects and non-pharmacologic comfort measures Outcome: Progressing   Problem: Clinical Measurements: Goal: Will remain free from infection Outcome: Progressing Goal: Cardiovascular complication will be avoided Outcome: Progressing   

## 2020-03-24 LAB — CBC WITH DIFFERENTIAL/PLATELET
Abs Immature Granulocytes: 0.03 10*3/uL (ref 0.00–0.07)
Basophils Absolute: 0 10*3/uL (ref 0.0–0.1)
Basophils Relative: 1 %
Eosinophils Absolute: 0.2 10*3/uL (ref 0.0–0.5)
Eosinophils Relative: 4 %
HCT: 30.7 % — ABNORMAL LOW (ref 39.0–52.0)
Hemoglobin: 9.6 g/dL — ABNORMAL LOW (ref 13.0–17.0)
Immature Granulocytes: 1 %
Lymphocytes Relative: 9 %
Lymphs Abs: 0.6 10*3/uL — ABNORMAL LOW (ref 0.7–4.0)
MCH: 25 pg — ABNORMAL LOW (ref 26.0–34.0)
MCHC: 31.3 g/dL (ref 30.0–36.0)
MCV: 79.9 fL — ABNORMAL LOW (ref 80.0–100.0)
Monocytes Absolute: 1 10*3/uL (ref 0.1–1.0)
Monocytes Relative: 16 %
Neutro Abs: 4.5 10*3/uL (ref 1.7–7.7)
Neutrophils Relative %: 69 %
Platelets: 167 10*3/uL (ref 150–400)
RBC: 3.84 MIL/uL — ABNORMAL LOW (ref 4.22–5.81)
RDW: 14 % (ref 11.5–15.5)
WBC: 6.4 10*3/uL (ref 4.0–10.5)
nRBC: 0 % (ref 0.0–0.2)

## 2020-03-24 LAB — PROTIME-INR
INR: 1.9 — ABNORMAL HIGH (ref 0.8–1.2)
Prothrombin Time: 21.1 seconds — ABNORMAL HIGH (ref 11.4–15.2)

## 2020-03-24 MED ORDER — WARFARIN SODIUM 6 MG PO TABS
6.0000 mg | ORAL_TABLET | Freq: Once | ORAL | Status: AC
  Administered 2020-03-24: 6 mg via ORAL
  Filled 2020-03-24: qty 1

## 2020-03-24 MED ORDER — ENOXAPARIN SODIUM 100 MG/ML ~~LOC~~ SOLN
1.0000 mg/kg | Freq: Two times a day (BID) | SUBCUTANEOUS | Status: DC
  Administered 2020-03-24 – 2020-03-27 (×8): 85 mg via SUBCUTANEOUS
  Filled 2020-03-24 (×8): qty 1

## 2020-03-24 NOTE — Social Work (Signed)
Pt requesting SNF placement due to NWB status/vac. At this time pt without insurance (Medicaid pending) or disability. Pt will require LOG placement, not previously unable to place pt even with LOG.   CSW continuing to follow for support with disposition.  Westley Hummer, MSW, [place] [facility]

## 2020-03-24 NOTE — Progress Notes (Addendum)
ANTICOAGULATION CONSULT NOTE - Follow Up Consult  Pharmacy Consult for Coumadin Indication: h/o VTE  Allergies  Allergen Reactions  . Other Other (See Comments)    "Cactus" caused blisters on tongue (if prepared as food)    Patient Measurements: Height: 5' 9.5" (176.5 cm) Weight: 86 kg (189 lb 9.5 oz) IBW/kg (Calculated) : 71.85   Vital Signs: Temp: 98.4 F (36.9 C) (05/17 1207) Temp Source: Oral (05/17 1207) BP: 106/81 (05/17 1207) Pulse Rate: 93 (05/17 1207)  Labs: Recent Labs    03/22/20 0307 03/22/20 0307 03/23/20 0704 03/24/20 0229  HGB 10.8*   < > 9.8* 9.6*  HCT 34.3*  --  31.4* 30.7*  PLT 246  --  180 167  LABPROT 30.1*  --  26.7* 21.1*  INR 3.0*  --  2.6* 1.9*  CREATININE 0.94  --  0.86  --    < > = values in this interval not displayed.    Estimated Creatinine Clearance: 92.9 mL/min (by C-G formula based on SCr of 0.86 mg/dL).  Assessment:  59 y/o male with history of  VTE (recurrent PE), MV repair on warfarin PTA with a goal INR of 2-3. Also, patient went into AF 5/12 PM. Hemoglobinlw stable at 9.6, Platelet count down to 167k.   INR has decreased to 1.9, subtherapeutic today. No bleeding noted.  Will increase doses cautiously as needed to maintain INR goal and limit risk of bleeding.   Notably, patient had INR>10 on admit with PTA regimen 7.5mg  daily and received Vitamin K.   Goal of Therapy:  INR 2-3 Monitor platelets by anticoagulation protocol: Yes   Plan:  Give warfarin 6 mg PO x1 this evening Daily INR, monitor for s/sx of bleeding  Nicole Cella, RPh Clinical Pharmacist [phone] Please check AMION for all [place] phone numbers After 10:00 PM, call [place] 03/24/2020 12:25 PM   ADDENDUM:   Dr. Horris Latino consulted pharmacy to bridge with lovenox 1mg /kg sq BID until INR = or >2.0   Plan:  Lovenox 85 mg sq BID.  Monitor INR, CBC.   Nicole Cella, RPh Clinical Pharmacist [phone] 03/24/2020 12:40 PM

## 2020-03-24 NOTE — Progress Notes (Signed)
Patient says he has been up a little but just does not really feel well today. Vital signs stable 250 cc in the canister of dark blood.  This means he has had about 75 cc since 2 days ago.  He will continue to work with physical therapy from a orthopedic standpoint he needs to maintain strict nonweightbearing.  Once he can safely do this and cleared by physical therapy he could be discharged with 1 week follow-up with orthopedics

## 2020-03-24 NOTE — Progress Notes (Signed)
Physical Therapy Treatment Patient Details Name: Leonard Bentley MRN: [id number] DOB: 21-Jun-1961 Today's Date: 03/24/2020    History of Present Illness Pt is a 59 y/o male admitted secondary to chest tightness. Pt found to have pulmonary edema with history of diastolic congestive heart.  Underwent Transmetatarsal amputation left foot with application Prevena wound VAC on 5/14; PMH including but not limited to HTN, chronic diastolic CHF, severe MR s/p mitral valve repair, recurrent PE, anxiety, non-Hodgkin's lymphoma, osteomyelitis of the left foot, and alcohol abuse.    PT Comments    Continuing work on functional mobility and activity tolerance;  He is more consistently keeping NWB LLE with use of RW for transfers and short distance ambulation; session focused on stair negotiation to give Leonard Bentley options for mobility and barrier negotiation no matter where he discharges; He was able to employ a shower seat and multiple sit<>stand transfers while progressing the seat up and down the steps with min assist progressing to minguard assist;   We discussed options for where to go from the hospital; Going to his brother's is not an option; He tells me he will call a friend of his to see about staying there; I highly encouraged this, and asked him to call his friend today;   My hope is that wherever he goes, his home situation will be able to safely support maintaining the VAC and  NWB LLE;  if his home environment doesn't, we must consider SNF, which he is agreeable to, but would be a difficult placement; Have been discussing with the Team  Follow Up Recommendations  Home health PT;SNF  If dc'ing to a private residence, would he be able to at least get [facility] up?     Equipment Recommendations  Other (comment)(shower seat for stair negotiation -- it is known that a shower seat is not typically paid for by any insurance, but if he has many steps to go up, it would be very helpful; What are our options  for getting one for him?  He will find out about stairs to his friend's home hopefully today  Wheelchair with elevating legrests   Recommendations for Other Services       Precautions / Restrictions Precautions Precautions: Fall Precaution Comments: wound vac Restrictions Weight Bearing Restrictions: Yes LLE Weight Bearing: Non weight bearing Other Position/Activity Restrictions: Strict NWB per Ortho note    Mobility  Bed Mobility Overal bed mobility: Needs Assistance Bed Mobility: Supine to Sit     Supine to sit: Supervision     General bed mobility comments: Incr time, Supervision/assist to manage IV and VAC cord in bedclothes  Transfers Overall transfer level: Needs assistance Equipment used: Rolling walker (2 wheeled)(and rail in stairwell) Transfers: Sit to/from Stand Sit to Stand: Min guard(with and without physical contact)         General transfer comment: Stood from bed to RW and recliner to RW, one instance of touching down L foot; Then practiced going up and down a flight of steps using a shower chair with leg lengths adjusted to stair height; Stood from shower chair on step multiple times, pulling up on sturdy handrail; problem-solved through pushing up from shower chair with one hand and one hand on rail versus both hands on rail; Stood from varying heights; fatigued after, but managed 10 steps  Ambulation/Gait Ambulation/Gait assistance: Min guard Gait Distance (Feet): 5 Feet(x2) Assistive device: Rolling walker (2 wheeled) Gait Pattern/deviations: (hop-to )     General Gait Details: Good  maintenance of NWB short distances   Stairs Stairs: Yes Stairs assistance: Min assist;Min guard Stair Management: One rail Right(with shower seat) Number of Stairs: 10 General stair comments: Demonstrated technique standing from shower chair (with leg lengths adjusted for even seat on stairs), and then advancing seat up, sitting back down, moving R foot up, and  standing again; Initially requiring close guard for safety, but he got the hang of it pretty quickly; went up and down 10 steps using this technqiue   Wheelchair Mobility    Modified Rankin (Stroke Patients Only)       Balance     Sitting balance-Leahy Scale: Good       Standing balance-Leahy Scale: Poor(approaching Fair)                              Cognition Arousal/Alertness: Awake/alert Behavior During Therapy: WFL for tasks assessed/performed Overall Cognitive Status: Within Functional Limits for tasks assessed(for simple mobility tasks)                                        Exercises      General Comments        Pertinent Vitals/Pain Pain Assessment: 0-10 Pain Score: 6  Pain Location: L foot Pain Descriptors / Indicators: Aching;Discomfort;Grimacing;Guarding Pain Intervention(s): Monitored during session;Premedicated before session    Home Living                      Prior Function            PT Goals (current goals can now be found in the care plan section) Acute Rehab PT Goals Patient Stated Goal: for his foot to heal PT Goal Formulation: With patient Time For Goal Achievement: 04/05/20 Potential to Achieve Goals: Good Progress towards PT goals: Progressing toward goals    Frequency    Min 3X/week      PT Plan Current plan remains appropriate    Co-evaluation              AM-PAC PT "6 Clicks" Mobility   Outcome Measure  Help needed turning from your back to your side while in a flat bed without using bedrails?: None Help needed moving from lying on your back to sitting on the side of a flat bed without using bedrails?: None Help needed moving to and from a bed to a chair (including a wheelchair)?: None Help needed standing up from a chair using your arms (e.g., wheelchair or bedside chair)?: A Little Help needed to walk in hospital room?: A Little Help needed climbing 3-5 steps with a  railing? : A Little 6 Click Score: 21    End of Session Equipment Utilized During Treatment: Gait belt Activity Tolerance: Patient tolerated treatment well Patient left: in bed;with call bell/phone within reach Nurse Communication: Mobility status PT Visit Diagnosis: Other abnormalities of gait and mobility (R26.89)     Time: 0912-1000 PT Time Calculation (min) (ACUTE ONLY): 48 min  Charges:  $Gait Training: 23-37 mins $Therapeutic Activity: 8-22 mins                     Roney Marion, PT  Acute Rehabilitation Services Pager [phone] Office [place] 03/24/2020, 10:59 AM

## 2020-03-24 NOTE — TOC Progression Note (Addendum)
Transition of Care [facility]) - Progression Note    Patient Details  Name: Leonard Bentley MRN: [id number] Date of Birth: 07/31/1961  Transition of Care [facility] CM/SW Contact  Jacalyn Lefevre Edson Snowball, RN Phone Number: 03/24/2020, 1:53 PM  Clinical Narrative:     Discussed SNF recommendation with patient. Patient has no insurance , and realizes he is difficult to place ( has been here before and unable to be placed even with LOG). So far TOC team has no bed offers. Patient is aware and has a friend he is going to call to see if he can stay with friend. NCM will follow up with patient later this afternoon. Patient has a walker and a cane at home already . Preenva VAC does not require HHRN . If patient can stay with friend will look into charity home health, will need address first .   Will order wheel chair and shower seat through [place] , see if he qualifies for charity DME.  Expected Discharge Plan: Home/Self Care Barriers to Discharge: Continued Medical Work up  Expected Discharge Plan and Services Expected Discharge Plan: Home/Self Care In-house Referral: Financial Counselor Discharge Planning Services: CM Consult Post Acute Care Choice: NA Living arrangements for the past 2 months: Single Family Home                   DME Agency: NA       HH Arranged: NA           Social Determinants of Health (SDOH) Interventions    Readmission Risk Interventions Readmission Risk Prevention Plan 03/21/2020 03/20/2020 01/31/2020  Transportation Screening Complete Complete Complete  PCP or Specialist Appt within 5-7 Days - - Complete  PCP or Specialist Appt within 3-5 Days Complete - -  Home Care Screening - - Complete  Medication Review (RN CM) - - Complete  HRI or Home Care Consult Complete - -  Social Work Consult for [place] Planning/Counseling Complete - -  Palliative Care Screening Not Applicable - -  Medication Review ([place]) Referral to Pharmacy Referral to Pharmacy -   PCP or Specialist appointment within 3-5 days of discharge Patient refused Complete -  [place] or Home Care Consult Complete Complete -  SW Recovery Care/Counseling Consult Complete Complete -  Palliative Care Screening Not Applicable Not Applicable -  [place] Not Applicable Not Applicable -  Some recent data might be hidden

## 2020-03-24 NOTE — Care Management (Signed)
    Durable Medical Equipment  (From admission, onward)         Start     Ordered   03/24/20 1405  For home use only DME lightweight manual wheelchair with seat cushion  Once    Comments: Patient suffers from Transmetatarsal amputation left foot with application Prevena wound VAC.which impairs their ability to perform daily activities like ambulating  in the home.  A cane  will not resolve  issue with performing activities of daily living. A wheelchair will allow patient to safely perform daily activities. Patient is not able to propel themselves in the home using a standard weight wheelchair due to Transmetatarsal amputation left foot with application Prevena wound VAC. Patient can self propel in the lightweight wheelchair. Length of need lifetime  . Accessories: elevating leg rests (ELRs), wheel locks, extensions and anti-tippers.  Elevating leg rests   Seat and back cushions   03/24/20 1406   03/24/20 1400  For home use only DME Shower stool  Once    Comments: shower seat for stair negotiation   03/24/20 1400

## 2020-03-24 NOTE — Progress Notes (Addendum)
PROGRESS NOTE  Leonard Bentley [id number] DOB: 1961/01/27 DOA: 03/18/2020 PCP: [name], NP  HPI/Recap of past 24 hours: HPI from Dr Leonard Bentley is a 59 y.o. male with medical history significant of HTN, chronic diastolic CHF, severe MR s/p mitral valve repair, recurrent PE, anxiety, non-Hodgkin's lymphoma, osteomyelitis of the left foot, and alcohol abuse presents with complaints of 1 week of chest tightness and shortness of breath.  He has had a dry nonproductive cough.  Associated symptoms include complaints of leg swelling.  He reports having worsening pins on needles sensation in both of his feet.  Last hospitalized in March for left foot wound dehiscence with concern for possible osteomyelitis s/p left third ray amputation by Dr. Sharol Bentley.  He had been wearing compression stockings has advised by Dr. Sharol Bentley.  Notes he had been cleared to start bearing weight on his left foot, but noted that he had not been able to get around much due to the pain.  The wound had been closed to his knowledge and he had not noticed any drainage or redness.  Denies having any significant fever or chills. In the ED, heart rates elevated up into the 140s temporarily, blood pressures elevated to 133/106, and all other vital signs maintained.  Labs significant for WBC 3.1, hemoglobin 11, troponins negative x2, BNP WNL and INR noted to be supra therapeutic.  Chest x-ray significant for mild interstitial edema.  Patient was Bentley 40 mg of Lasix IV.  TRH called to admit.     Today, patient denies any new complaints, still with postop pain, denies any chest pain, shortness of breath, abdominal pain, nausea/vomiting, fever/chills.  Noted intermittent tachycardia, could be related to pain.     Assessment/Plan: Principal Problem:   Pulmonary edema Active Problems:   Alcohol abuse   Cigarette smoker   Personal history of pulmonary embolism   Neuropathy due to chemotherapeutic drug ([place])   Anxiety   S/P  minimally-invasive mitral valve repair   Supratherapeutic INR   Subacute osteomyelitis, left ankle and foot (HCC)   Wound dehiscence, surgical, initial encounter   Microcytic hypochromic anemia   Chest pain   Pulmonary edema/?Acute on chronic diastolic HF/History of mitral valve repair Currently on room air, stable BNP WNL, Troponins negative Chest x-ray significant for mild interstitial edema Echo showed EF noted to be 50-55% Strict intake and output, daily weight S/P IV diuresis Follow-up with cardiology as an outpatient Telemetry  Supratherapeutic INR, R eye conjunctival hemorrhage On admission patient noted to have INR greater than 10 S/p 2.5 g of vitamin K p.o. Daily monitoring of PT/INR Pharmacy to dose  Wound dehiscence of left foot, history of osteomyelitis s/p transmetatarsal amputation with application of wound VAC on 03/21/2020 by Dr. Sharol Bentley Fever spike of 100.4 on 03/22/2020, with no leukocytosis Patient has history of osteomyelitis of the left foot for which he just recently had a third ray amputation by Dr. Sharol Bentley on 01/30/20 CRP WNL, ESR minimally elevated  X-rays of the left foot: Findings are suspicious for osteomyelitis  Continue oxycodone as needed for pain Dr. Sharol Bentley consulted, appreciate recs  Alcohol abuse, with possible withdrawal Patient still reports drinking a bottle of wine daily. His last drink was PTA Counseled on need for cessation of alcohol abuse CIWA protocols with scheduled Ativan  Recurrent PEs on chronic anticoagulation D-dimer negative Patient with history of recurrent PEs on anticoagulation of Coumadin Coumadin per pharmacy  Neuropathy related with chemotherapy, history of Non-Hodgkin's lymphoma Patient  with history of neuropathy thought to be related with chemotherapy drugs to treat non-Hodgkin's lymphoma Previously had been treated with systemic chemotherapy of CHOP and Rituxan last Bentley in 11/2011 Continue  gabapentin  Anxiety Continue Xanax as needed  Tobacco abuse Continue nicotine patch       Malnutrition Type:      Malnutrition Characteristics:      Nutrition Interventions:       Estimated body mass index is 27.6 kg/m as calculated from the following:   Height as of this encounter: 5' 9.5" (1.765 m).   Weight as of this encounter: 86 kg.     Code Status: Full  Family Communication: Discussed extensively with patient  Disposition Plan: Status is: Inpatient  Remains inpatient appropriate because:Inpatient level of care appropriate due to severity of illness, difficult safe discharge plan as patient does not have insurance, so unlikely to get a SNF to accept him, currently homeless, awaiting to ask a friend if he could stay.   Dispo: The patient is from: Home              Anticipated d/c is to: Home Vs SNF              Anticipated d/c date is: 2 days              Patient currently is not medically stable to d/c.  Still requiring IV narcotics  Consultants:  Dr. Sharol Bentley   Procedures:  Left foot transmetatarsal amputation on 03/21/2020  Antimicrobials:  Ancef for surgical prophylaxis  DVT prophylaxis: Coumadin    Objective: Vitals:   03/24/20 0500 03/24/20 0513 03/24/20 0856 03/24/20 1207  BP:  113/82 106/80 106/81  Pulse:  95 (!) 105 93  Resp:  18  16  Temp:  99.1 F (37.3 C)  98.4 F (36.9 C)  TempSrc:  Oral  Oral  SpO2:  99%  98%  Weight: 86 kg     Height:        Intake/Output Summary (Last 24 hours) at 03/24/2020 1309 Last data filed at 03/23/2020 2203 Gross per 24 hour  Intake 393 ml  Output 500 ml  Net -107 ml   Filed Weights   03/21/20 0512 03/21/20 1455 03/24/20 0500  Weight: 85.5 kg 84.4 kg 86 kg    Exam:  General: NAD, tremor present  Cardiovascular: S1, S2 present  Respiratory:  Diminished breath sounds bilaterally  Abdomen: Soft, nontender, nondistended, bowel sounds present  Musculoskeletal: No bilateral pedal  edema noted, dressing of left foot C/D/I, wound VAC noted  Skin:  As above  Psychiatry: Normal mood    Data Reviewed: CBC: Recent Labs  Lab 03/20/20 0246 03/21/20 0623 03/22/20 0307 03/23/20 0704 03/24/20 0229  WBC 4.8 3.6* 6.8 6.5 6.4  NEUTROABS 3.1 2.1 5.5 4.4 4.5  HGB 11.8* 10.6* 10.8* 9.8* 9.6*  HCT 36.0* 33.5* 34.3* 31.4* 30.7*  MCV 77.1* 78.3* 79.0* 79.9* 79.9*  PLT 278 230 246 180 683   Basic Metabolic Panel: Recent Labs  Lab 03/19/20 0710 03/20/20 0246 03/21/20 0623 03/22/20 0307 03/23/20 0704  NA 138 137 136 135 135  K 3.9 3.5 3.8 4.4 4.0  CL 98 98 99 98 98  CO2 '26 27 28 31 25  '$ GLUCOSE 119* 125* 98 110* 109*  BUN 13 22* 25* 19 12  CREATININE 0.93 1.02 1.00 0.94 0.86  CALCIUM 9.5 9.0 9.1 9.0 9.1  MG 1.6*  --   --   --   --   PHOS  4.5  --   --   --   --    GFR: Estimated Creatinine Clearance: 92.9 mL/min (by C-G formula based on SCr of 0.86 mg/dL). Liver Function Tests: No results for input(s): AST, ALT, ALKPHOS, BILITOT, PROT, ALBUMIN in the last 168 hours. No results for input(s): LIPASE, AMYLASE in the last 168 hours. No results for input(s): AMMONIA in the last 168 hours. Coagulation Profile: Recent Labs  Lab 03/20/20 0246 03/21/20 0623 03/22/20 0307 03/23/20 0704 03/24/20 0229  INR 3.3* 3.4* 3.0* 2.6* 1.9*   Cardiac Enzymes: No results for input(s): CKTOTAL, CKMB, CKMBINDEX, TROPONINI in the last 168 hours. BNP (last 3 results) No results for input(s): PROBNP in the last 8760 hours. HbA1C: No results for input(s): HGBA1C in the last 72 hours. CBG: No results for input(s): GLUCAP in the last 168 hours. Lipid Profile: No results for input(s): CHOL, HDL, LDLCALC, TRIG, CHOLHDL, LDLDIRECT in the last 72 hours. Thyroid Function Tests: No results for input(s): TSH, T4TOTAL, FREET4, T3FREE, THYROIDAB in the last 72 hours. Anemia Panel: No results for input(s): VITAMINB12, FOLATE, FERRITIN, TIBC, IRON, RETICCTPCT in the last 72 hours. Urine  analysis:    Component Value Date/Time   COLORURINE YELLOW 01/27/2020 1841   APPEARANCEUR CLEAR 01/27/2020 1841   APPEARANCEUR Clear 08/03/2019 1143   LABSPEC 1.015 01/27/2020 1841   PHURINE 6.0 01/27/2020 1841   GLUCOSEU NEGATIVE 01/27/2020 1841   HGBUR SMALL (A) 01/27/2020 1841   BILIRUBINUR NEGATIVE 01/27/2020 1841   BILIRUBINUR small (A) 08/03/2019 1144   BILIRUBINUR Negative 08/03/2019 1143   KETONESUR 5 (A) 01/27/2020 1841   PROTEINUR 30 (A) 01/27/2020 1841   UROBILINOGEN 1.0 08/03/2019 1144   UROBILINOGEN 1.0 01/25/2013 0825   NITRITE NEGATIVE 01/27/2020 1841   LEUKOCYTESUR NEGATIVE 01/27/2020 1841   Sepsis Labs: '@LABRCNTIP'$ (procalcitonin:4,lacticidven:4)  ) Recent Results (from the past 240 hour(s))  SARS CORONAVIRUS 2 (TAT 6-24 HRS) Nasopharyngeal Nasopharyngeal Swab     Status: None   Collection Time: 03/18/20  6:21 AM   Specimen: Nasopharyngeal Swab  Result Value Ref Range Status   SARS Coronavirus 2 NEGATIVE NEGATIVE Final    Comment: (NOTE) SARS-CoV-2 target nucleic acids are NOT DETECTED. The SARS-CoV-2 RNA is generally detectable in upper and lower respiratory specimens during the acute phase of infection. Negative results do not preclude SARS-CoV-2 infection, do not rule out co-infections with other pathogens, and should not be used as the sole basis for treatment or other patient management decisions. Negative results must be combined with clinical observations, patient history, and epidemiological information. The expected result is Negative. Fact Sheet for Patients: SugarRoll.be Fact Sheet for Healthcare Providers: https://www.woods-mathews.com/ This test is not yet approved or cleared by the [place] FDA and  has been authorized for detection and/or diagnosis of SARS-CoV-2 by FDA under an Emergency Use Authorization (EUA). This EUA will remain  in effect (meaning this test can be used) for the duration of  the COVID-19 declaration under Section 56 4(b)(1) of the Act, 21 U.S.C. section 360bbb-3(b)(1), unless the authorization is terminated or revoked sooner. Performed at [facility], [place] [address], [place], [place] [address]   Surgical pcr screen     Status: Abnormal   Collection Time: 03/20/20  6:54 PM   Specimen: Nasal Mucosa; Nasal Swab  Result Value Ref Range Status   MRSA, PCR POSITIVE (A) NEGATIVE Final    Comment: RESULT CALLED TO, READ BACK BY AND VERIFIED WITH: K.GOAD,RN AT 2044 12/22/19 BY L.PITT    Staphylococcus  aureus POSITIVE (A) NEGATIVE Final    Comment: (NOTE) The Xpert SA Assay (FDA approved for NASAL specimens in patients 74 years of age and older), is one component of a comprehensive surveillance program. It is not intended to diagnose infection nor to guide or monitor treatment.       Studies: No results found.  Scheduled Meds: . docusate sodium  100 mg Oral BID  . enoxaparin (LOVENOX) injection  1 mg/kg Subcutaneous Q12H  . folic acid  1 mg Oral Daily  . gabapentin  100 mg Oral TID  . metoprolol succinate  50 mg Oral Daily  . multivitamin with minerals  1 tablet Oral Daily  . mupirocin ointment   Nasal BID  . nicotine  21 mg Transdermal Daily  . sodium chloride flush  3 mL Intravenous Once  . sodium chloride flush  3 mL Intravenous Q12H  . thiamine  100 mg Oral Daily   Or  . thiamine  100 mg Intravenous Daily  . warfarin  6 mg Oral ONCE-1600  . Warfarin - Pharmacist Dosing Inpatient   Does not apply q1600    Continuous Infusions: . sodium chloride       LOS: 5 days     Alma Friendly, MD [facility]  If 7PM-7AM, please contact night-coverage [email or web address] 03/24/2020, 1:09 PM

## 2020-03-24 NOTE — NC FL2 (Signed)
[place] MEDICAID FL2 LEVEL OF CARE SCREENING TOOL     IDENTIFICATION  Patient Name: Leonard Bentley Birthdate: [birth year] Sex: male Admission Date (Current Location): 03/18/2020  [facility] and [place] Number:  Herbalist and Address:  [facility] [place]. [facility], [place] [address] [place], [place] [address]      Provider Number: [id number]  Attending Physician Name and Address:  Alma Friendly, MD  Relative Name and Phone Number:       Current Level of Care: Hospital Recommended Level of Care: [place] Prior Approval Number:    Date Approved/Denied:   PASRR Number: pending  Discharge Plan: SNF    Current Diagnoses: Patient Active Problem List   Diagnosis Date Noted  . Chest pain 03/19/2020  . Pulmonary edema 03/18/2020  . Microcytic hypochromic anemia 03/18/2020  . Wound dehiscence, surgical, initial encounter   . Pressure injury of skin 01/28/2020  . Subacute osteomyelitis, left ankle and foot ([place]) 01/27/2020  . Sepsis ([place]) 01/27/2020  . Cellulitis of left foot   . Left foot infection 01/11/2020  . Chronic combined systolic and diastolic heart failure ([place]) 03/06/2019  . Coagulation disorder ([place]) 03/06/2019  . Impacted cerumen of both ears 02/09/2019  . Osteomyelitis of great toe of left foot ([place]) 01/09/2019  . Foot ulcer, left ([place]) 12/27/2018  . Cellulitis 12/07/2018  . Bleeding from left ear 11/28/2018  . HTN (hypertension) 11/02/2018  . UTI (urinary tract infection) 11/02/2018  . Ecchymosis   . Tachyarrhythmia 08/30/2018  . Hypomagnesemia 08/30/2018  . Recurrent pulmonary emboli ([place])   . Hematuria   . Delirium tremens ([place])   . LFTs abnormal   . Alcoholic hepatitis without ascites   . Alcohol withdrawal ([place]) 06/04/2018  . Dizziness   . Posterior tibial tendon dysfunction 02/21/2018  . Supratherapeutic INR 11/27/2016  . Dehydration 11/27/2016  . SVT (supraventricular tachycardia) ([place])   . S/P  minimally-invasive mitral valve repair 10/27/2016  . History of pulmonary embolus (PE) 04/11/2016  . Left sided numbness   . Anxiety 04/02/2016  . Panic attacks 04/02/2016  . Mitral valve prolapse   . Mitral regurgitation 01/20/2016  . Atrial tachycardia ([place]) 01/06/2016  . Murmur 01/06/2016  . [place] I 10/09/2015  . PAC (premature atrial contraction) 10/09/2015  . Arthritis 08/14/2015  . Neuropathy due to chemotherapeutic drug ([place]) 03/09/2013  . Leucopenia 01/31/2013  . Personal history of pulmonary embolism 12/28/2012  . Alcohol abuse 08/24/2012  . Cigarette smoker 08/24/2012  . Hemorrhoid 03/08/2012  . Anemia 12/10/2011  . History of non-Hodgkin's lymphoma 09/25/2011    Orientation RESPIRATION BLADDER Height & Weight     Self, Time, Situation, Place  Normal Continent Weight: 189 lb 9.5 oz (86 kg) Height:  5' 9.5" (176.5 cm)  BEHAVIORAL SYMPTOMS/MOOD NEUROLOGICAL BOWEL NUTRITION STATUS      Continent Diet(see discharge summary)  AMBULATORY STATUS COMMUNICATION OF NEEDS Skin   Extensive Assist Verbally Surgical wounds, Wound Vac(transmet amputation with prevena wound vac.)                       Personal Care Assistance Level of Assistance  Bathing, Feeding, Dressing Bathing Assistance: Maximum assistance Feeding assistance: Independent Dressing Assistance: Maximum assistance     Functional Limitations Info  Sight, Hearing, Speech Sight Info: Adequate Hearing Info: Adequate Speech Info: Adequate    SPECIAL CARE FACTORS FREQUENCY  PT (By licensed PT), OT (By licensed OT)     PT Frequency: 5x week  OT Frequency: 5x week            Contractures Contractures Info: Not present    Additional Factors Info  Code Status, Allergies Code Status Info: Full Code Allergies Info: [place]           Current Medications (03/24/2020):  This is the current hospital active medication list Current Facility-Administered Medications  Medication Dose Route Frequency  Provider Last Rate Last Admin  . 0.9 %  sodium chloride infusion  250 mL Intravenous PRN Persons, Bevely Palmer, PA      . acetaminophen (TYLENOL) tablet 650 mg  650 mg Oral Q4H PRN Persons, Bevely Palmer, PA   650 mg at 03/23/20 1008  . ALPRAZolam Duanne Moron) tablet 0.25 mg  0.25 mg Oral BID PRN Persons, Bevely Palmer, PA   0.25 mg at 03/24/20 1050  . docusate sodium (COLACE) capsule 100 mg  100 mg Oral BID Persons, Bevely Palmer, PA   100 mg at 03/24/20 0849  . folic acid (FOLVITE) tablet 1 mg  1 mg Oral Daily Persons, Bevely Palmer, PA   1 mg at 03/24/20 0849  . gabapentin (NEURONTIN) capsule 100 mg  100 mg Oral TID Persons, Bevely Palmer, PA   100 mg at 03/24/20 0850  . HYDROmorphone (DILAUDID) injection 0.5 mg  0.5 mg Intravenous Q4H PRN Persons, Bevely Palmer, PA   0.5 mg at 03/24/20 0857  . metoprolol succinate (TOPROL-XL) 24 hr tablet 50 mg  50 mg Oral Daily Persons, Bevely Palmer, PA   50 mg at 03/24/20 0849  . metoprolol tartrate (LOPRESSOR) injection 2.5 mg  2.5 mg Intravenous Q8H PRN Persons, Bevely Palmer, PA   2.5 mg at 03/19/20 1825  . multivitamin with minerals tablet 1 tablet  1 tablet Oral Daily Persons, Bevely Palmer, [place]   1 tablet at 03/24/20 0849  . mupirocin ointment (BACTROBAN) 2 %   Nasal BID Persons, Bevely Palmer, [place]   Given at 03/24/20 [phone]  . naphazoline-glycerin (CLEAR EYES REDNESS) ophth solution 1-2 drop  1-2 drop Right Eye QID PRN Persons, Bevely Palmer, PA   2 drop at 03/19/20 2210  . nicotine (NICODERM CQ - dosed in mg/24 hours) patch 21 mg  21 mg Transdermal Daily Persons, Bevely Palmer, PA   21 mg at 03/24/20 0848  . nitroGLYCERIN (NITROSTAT) SL tablet 0.4 mg  0.4 mg Sublingual Q5 min PRN Persons, Bevely Palmer, PA      . ondansetron [facility]) injection 4 mg  4 mg Intravenous Q6H PRN Persons, Bevely Palmer, [place]      . oxyCODONE (Oxy IR/ROXICODONE) immediate release tablet 5 mg  5 mg Oral Q6H PRN Persons, Bevely Palmer, PA   5 mg at 03/24/20 0542  . sodium chloride flush (NS) 0.9 % injection 3 mL  3 mL Intravenous Once Persons, Bevely Palmer, PA      . sodium chloride flush (NS) 0.9 % injection 3 mL  3 mL Intravenous Q12H Persons, Bevely Palmer, PA   3 mL at 03/24/20 0851  . sodium chloride flush (NS) 0.9 % injection 3 mL  3 mL Intravenous PRN Persons, Bevely Palmer, PA      . thiamine tablet 100 mg  100 mg Oral Daily Persons, Bevely Palmer, PA   100 mg at 03/24/20 [id number]   Or  . thiamine (B-1) injection 100 mg  100 mg Intravenous Daily Persons, Bevely Palmer, [place]      . Warfarin - Pharmacist Dosing Inpatient   Does not apply [id number] Persons, Bevely Palmer, [place]  Given at 03/22/20 1653     Discharge Medications: Please see discharge summary for a list of discharge medications.  Relevant Imaging Results:  Relevant Lab Results:   Additional Information SS[id number] [place], [place]

## 2020-03-24 NOTE — Progress Notes (Signed)
Occupational Therapy Treatment Patient Details Name: Leonard Bentley MRN: [id number] DOB: 1961/06/28 Today's Date: 03/24/2020    History of present illness Pt is a 60 y/o male admitted secondary to chest tightness. Pt found to have pulmonary edema with history of diastolic congestive heart.  Underwent Transmetatarsal amputation left foot with application Prevena wound VAC on 5/14; PMH including but not limited to HTN, chronic diastolic CHF, severe MR s/p mitral valve repair, recurrent PE, anxiety, non-Hodgkin's lymphoma, osteomyelitis of the left foot, and alcohol abuse.   OT comments  Pt making steady progress towards OT goals this session. Session focus on functional mobility, shower transfers and LB dressing from EOB. Overall, pt requires min guard for functional mobility with RW with pt able to maintain NWB status with no cues. Education provided on LB dressing with wound vac and shower transfer with shower chair when medically able. Updated DC recs as pt unable to return to brothers house. Potential for pt to go to friends house but feel DC to SNF prior to DC to friends remains the safest and most beneficial plan for DC. Will let OTR know about change in POC. Will continue to follow acutely per POC.    Follow Up Recommendations  Home health OT;Supervision - Intermittent;SNF    Equipment Recommendations  Tub/shower seat    Recommendations for Other Services      Precautions / Restrictions Precautions Precautions: Fall Precaution Comments: wound vac Restrictions Weight Bearing Restrictions: Yes LLE Weight Bearing: Non weight bearing Other Position/Activity Restrictions: Strict NWB per Ortho note       Mobility Bed Mobility Overal bed mobility: Needs Assistance Bed Mobility: Supine to Sit;Sit to Supine     Supine to sit: Supervision Sit to supine: Supervision;HOB elevated   General bed mobility comments: increased time and effort with use of bed rails and elevated HOB. cues for  descending for safety  Transfers Overall transfer level: Needs assistance Equipment used: Rolling walker (2 wheeled) Transfers: Sit to/from Stand Sit to Stand: Min guard         General transfer comment: supervision and cues for hand placement    Balance Overall balance assessment: Needs assistance Sitting-balance support: No upper extremity supported;Feet supported Sitting balance-Leahy Scale: Good     Standing balance support: During functional activity;Single extremity supported Standing balance-Leahy Scale: Fair Standing balance comment: able to don mask in standing with one UE support with min guard                           ADL either performed or assessed with clinical judgement   ADL Overall ADL's : Needs assistance/impaired                 Upper Body Dressing : Independent   Lower Body Dressing: Supervision/safety;Sitting/lateral leans Lower Body Dressing Details (indicate cue type and reason): supervision to don darco shoe and slide on shoe from EOB; education provided on LB dressing with wound vac with pt verbalizing understanding Toilet Transfer: Min Marine scientist Details (indicate cue type and reason): simulated via functional mobility with RW       Tub/Shower Transfer Details (indicate cue type and reason): education on shower transfer with shower chair to walkin shower or tub shower Functional mobility during ADLs: Min guard;Cueing for safety;Rolling walker General ADL Comments: pt continues to present with decreased activity tolerance, generalized weakness and WB restrictions. Session focus on household distance functional mobility with RW, LB dressing education. overall, pt  requries min guard for functional mobility and supervision for LB dressing from EOB     Vision       Perception     Praxis      Cognition Arousal/Alertness: Awake/alert Behavior During Therapy: WFL for tasks assessed/performed Overall  Cognitive Status: Within Functional Limits for tasks assessed                                          Exercises     Shoulder Instructions       General Comments dressing appears clean and dry    Pertinent Vitals/ Pain       Pain Assessment: Faces Pain Score: 6  Faces Pain Scale: Hurts little more Pain Location: L foot Pain Descriptors / Indicators: Aching;Discomfort;Grimacing;Guarding Pain Intervention(s): Monitored during session  Home Living                                          Prior Functioning/Environment              Frequency  Min 2X/week        Progress Toward Goals  OT Goals(current goals can now be found in the care plan section)  Progress towards OT goals: Progressing toward goals  Acute Rehab OT Goals Patient Stated Goal: for his foot to heal OT Goal Formulation: With patient Time For Goal Achievement: 04/05/20 Potential to Achieve Goals: Good  Plan Discharge plan needs to be updated    Co-evaluation                 AM-PAC OT "6 Clicks" Daily Activity     Outcome Measure   Help from another person eating meals?: None Help from another person taking care of personal grooming?: None Help from another person toileting, which includes using toliet, bedpan, or urinal?: A Little Help from another person bathing (including washing, rinsing, drying)?: A Little Help from another person to put on and taking off regular upper body clothing?: None Help from another person to put on and taking off regular lower body clothing?: A Little 6 Click Score: 21    End of Session Equipment Utilized During Treatment: Gait belt;Rolling walker;Other (comment)(darco shoe)  OT Visit Diagnosis: Other abnormalities of gait and mobility (R26.89);Unsteadiness on feet (R26.81);Pain Pain - Right/Left: Left Pain - part of body: Ankle and joints of foot   Activity Tolerance Patient tolerated treatment well   Patient Left in  bed;with call bell/phone within reach;Other (comment)(sitting EOB)   Nurse Communication Mobility status        Time: 1259-1320 OT Time Calculation (min): 21 min  Charges: OT General Charges $OT Visit: 1 Visit OT Treatments $Self Care/Home Management : 8-22 mins  Lanier Clam., COTA/L Acute Rehabilitation Services [phone]   Ihor Gully 03/24/2020, 1:34 PM

## 2020-03-25 LAB — BASIC METABOLIC PANEL
Anion gap: 9 (ref 5–15)
BUN: 11 mg/dL (ref 6–20)
CO2: 26 mmol/L (ref 22–32)
Calcium: 8.9 mg/dL (ref 8.9–10.3)
Chloride: 100 mmol/L (ref 98–111)
Creatinine, Ser: 0.8 mg/dL (ref 0.61–1.24)
GFR calc Af Amer: >60 mL/min (ref >60)
GFR calc non Af Amer: >60 mL/min (ref >60)
Glucose, Bld: 107 mg/dL — ABNORMAL HIGH (ref 70–99)
Potassium: 3.8 mmol/L (ref 3.5–5.1)
Sodium: 135 mmol/L (ref 135–145)

## 2020-03-25 LAB — CBC WITH DIFFERENTIAL/PLATELET
Abs Immature Granulocytes: 0.02 10*3/uL (ref 0.00–0.07)
Basophils Absolute: 0 10*3/uL (ref 0.0–0.1)
Basophils Relative: 1 %
Eosinophils Absolute: 0.3 10*3/uL (ref 0.0–0.5)
Eosinophils Relative: 5 %
HCT: 27.5 % — ABNORMAL LOW (ref 39.0–52.0)
Hemoglobin: 8.9 g/dL — ABNORMAL LOW (ref 13.0–17.0)
Immature Granulocytes: 0 %
Lymphocytes Relative: 10 %
Lymphs Abs: 0.5 10*3/uL — ABNORMAL LOW (ref 0.7–4.0)
MCH: 25.7 pg — ABNORMAL LOW (ref 26.0–34.0)
MCHC: 32.4 g/dL (ref 30.0–36.0)
MCV: 79.5 fL — ABNORMAL LOW (ref 80.0–100.0)
Monocytes Absolute: 1.1 10*3/uL — ABNORMAL HIGH (ref 0.1–1.0)
Monocytes Relative: 21 %
Neutro Abs: 3.2 10*3/uL (ref 1.7–7.7)
Neutrophils Relative %: 63 %
Platelets: 181 10*3/uL (ref 150–400)
RBC: 3.46 MIL/uL — ABNORMAL LOW (ref 4.22–5.81)
RDW: 13.9 % (ref 11.5–15.5)
WBC: 5.2 10*3/uL (ref 4.0–10.5)
nRBC: 0 % (ref 0.0–0.2)

## 2020-03-25 LAB — PROTIME-INR
INR: 1.8 — ABNORMAL HIGH (ref 0.8–1.2)
Prothrombin Time: 20 seconds — ABNORMAL HIGH (ref 11.4–15.2)

## 2020-03-25 MED ORDER — WARFARIN SODIUM 5 MG PO TABS
5.0000 mg | ORAL_TABLET | Freq: Once | ORAL | Status: AC
  Administered 2020-03-25: 5 mg via ORAL
  Filled 2020-03-25: qty 1

## 2020-03-25 NOTE — Progress Notes (Signed)
PROGRESS NOTE  Leonard Bentley KWI:[id number] DOB: November 19, 1960 DOA: 03/18/2020 PCP: [name], NP  HPI/Recap of past 24 hours: HPI from Dr [name] Leonard Bentley is Leonard 59 y.o. male with medical history significant of HTN, chronic diastolic CHF, severe MR s/p mitral valve repair, recurrent PE, anxiety, non-Hodgkin's lymphoma, osteomyelitis of the left foot, and alcohol abuse presents with complaints of 1 week of chest tightness and shortness of breath.  He has had Leonard dry nonproductive cough.  Associated symptoms include complaints of leg swelling.  He reports having worsening pins on needles sensation in both of his feet.  Last hospitalized in March for left foot wound dehiscence with concern for possible osteomyelitis s/p left third ray amputation by Dr. [name].  He had been wearing compression stockings has advised by Dr. [name].  Notes he had been cleared to start bearing weight on his left foot, but noted that he had not been able to get around much due to the pain.  The wound had been closed to his knowledge and he had not noticed any drainage or redness.  Denies having any significant fever or chills. In the ED, heart rates elevated up into the 140s temporarily, blood pressures elevated to 133/106, and all other vital signs maintained.  Labs significant for WBC 3.1, hemoglobin 11, troponins negative x2, BNP WNL and INR noted to be supra therapeutic.  Chest x-ray significant for mild interstitial edema.  Patient was given 40 mg of Lasix IV.  TRH called to admit.     Today, patient denies any new complaints, still with postop pain.       Assessment/Plan: Principal Problem:   Pulmonary edema Active Problems:   Alcohol abuse   Cigarette smoker   Personal history of pulmonary embolism   Neuropathy due to chemotherapeutic drug ([place])   Anxiety   S/P minimally-invasive mitral valve repair   Supratherapeutic INR   Subacute osteomyelitis, left ankle and foot (HCC)   Wound dehiscence, surgical,  initial encounter   Microcytic hypochromic anemia   Chest pain   Pulmonary edema/?Acute on chronic diastolic HF/History of mitral valve repair Currently on room air, stable BNP WNL, Troponins negative Chest x-ray significant for mild interstitial edema Echo showed EF noted to be 50-55% Strict intake and output, daily weight S/P IV diuresis Follow-up with cardiology as an outpatient Telemetry  Supratherapeutic INR, R eye conjunctival hemorrhage On admission patient noted to have INR greater than 10 S/p 2.5 g of vitamin K p.o. Daily monitoring of PT/INR Pharmacy to dose  Wound dehiscence of left foot, history of osteomyelitis s/p transmetatarsal amputation with application of wound VAC on 03/21/2020 by Dr. [name] Last fever spike of 100.4 on 03/22/2020, with no leukocytosis Patient has history of osteomyelitis of the left foot for which he just recently had Leonard third ray amputation by Dr. [name] on 01/30/20 CRP WNL, ESR minimally elevated  X-rays of the left foot: Findings are suspicious for osteomyelitis  Pain management Dr. [name] consulted, appreciate recs  Alcohol abuse, with possible withdrawal Patient still reports drinking Leonard bottle of wine daily. His last drink was PTA Counseled on need for cessation of alcohol abuse  Recurrent PEs on chronic anticoagulation D-dimer negative Patient with history of recurrent PEs on anticoagulation of Coumadin Coumadin per pharmacy  Neuropathy related with chemotherapy, history of Non-Hodgkin's lymphoma Patient with history of neuropathy thought to be related with chemotherapy drugs to treat non-Hodgkin's lymphoma Previously had been treated with systemic chemotherapy of CHOP and Rituxan  last given in 11/2011 Continue gabapentin  Anxiety Continue Xanax as needed  Tobacco abuse Continue nicotine patch       Malnutrition Type:      Malnutrition Characteristics:      Nutrition Interventions:       Estimated body mass  index is 27.6 kg/m as calculated from the following:   Height as of this encounter: 5' 9.5" (1.765 m).   Weight as of this encounter: 86 kg.     Code Status: Full  Family Communication: Discussed extensively with patient  Disposition Plan: Status is: Inpatient  Remains inpatient appropriate because:Inpatient level of care appropriate due to severity of illness, difficult safe discharge plan as patient does not have insurance, so unlikely to get Leonard SNF to accept him, currently homeless, awaiting to ask Leonard friend if he could stay.   Dispo: The patient is from: Home              Anticipated d/c is to: Home Vs SNF              Anticipated d/c date is: 2 days              Patient currently is not medically stable to d/c.  Still requiring IV narcotics  Consultants:  Dr. [name]   Procedures:  Left foot transmetatarsal amputation on 03/21/2020  Antimicrobials:  Ancef for surgical prophylaxis  DVT prophylaxis: Coumadin    Objective: Vitals:   03/24/20 1759 03/25/20 0025 03/25/20 0513 03/25/20 1304  BP: 105/77 112/79 (!) 151/122 107/84  Pulse: 95 89 88 89  Resp: '18 16 16 17  '$ Temp: 98.7 F (37.1 C) 98.4 F (36.9 C) 98.6 F (37 C) 98.4 F (36.9 C)  TempSrc: Oral Oral Oral Oral  SpO2: 100% 98% 90% 100%  Weight:      Height:        Intake/Output Summary (Last 24 hours) at 03/25/2020 1518 Last data filed at 03/25/2020 0700 Gross per 24 hour  Intake --  Output 1350 ml  Net -1350 ml   Filed Weights   03/21/20 0512 03/21/20 1455 03/24/20 0500  Weight: 85.5 kg 84.4 kg 86 kg    Exam:  General: NAD, mild tremor present  Cardiovascular: S1, S2 present  Respiratory: CTAB  Abdomen: Soft, nontender, nondistended, bowel sounds present  Musculoskeletal: No bilateral pedal edema noted, dressing of left foot C/D/I, wound VAC noted  Skin:  As above  Psychiatry: Normal mood    Data Reviewed: CBC: Recent Labs  Lab 03/21/20 0623 03/22/20 0307 03/23/20 0704  03/24/20 0229 03/25/20 0326  WBC 3.6* 6.8 6.5 6.4 5.2  NEUTROABS 2.1 5.5 4.4 4.5 3.2  HGB 10.6* 10.8* 9.8* 9.6* 8.9*  HCT 33.5* 34.3* 31.4* 30.7* 27.5*  MCV 78.3* 79.0* 79.9* 79.9* 79.5*  PLT 230 246 180 167 503   Basic Metabolic Panel: Recent Labs  Lab 03/19/20 0710 03/19/20 0710 03/20/20 0246 03/21/20 0623 03/22/20 0307 03/23/20 0704 03/25/20 0326  NA 138   < > 137 136 135 135 135  K 3.9   < > 3.5 3.8 4.4 4.0 3.8  CL 98   < > 98 99 98 98 100  CO2 26   < > '27 28 31 25 26  '$ GLUCOSE 119*   < > 125* 98 110* 109* 107*  BUN 13   < > 22* 25* '19 12 11  '$ CREATININE 0.93   < > 1.02 1.00 0.94 0.86 0.80  CALCIUM 9.5   < > 9.0 9.1 9.0  9.1 8.9  MG 1.6*  --   --   --   --   --   --   PHOS 4.5  --   --   --   --   --   --    < > = values in this interval not displayed.   GFR: Estimated Creatinine Clearance: 99.9 mL/min (by C-G formula based on SCr of 0.8 mg/dL). Liver Function Tests: No results for input(s): AST, ALT, ALKPHOS, BILITOT, PROT, ALBUMIN in the last 168 hours. No results for input(s): LIPASE, AMYLASE in the last 168 hours. No results for input(s): AMMONIA in the last 168 hours. Coagulation Profile: Recent Labs  Lab 03/21/20 0623 03/22/20 0307 03/23/20 0704 03/24/20 0229 03/25/20 0326  INR 3.4* 3.0* 2.6* 1.9* 1.8*   Cardiac Enzymes: No results for input(s): CKTOTAL, CKMB, CKMBINDEX, TROPONINI in the last 168 hours. BNP (last 3 results) No results for input(s): PROBNP in the last 8760 hours. HbA1C: No results for input(s): HGBA1C in the last 72 hours. CBG: No results for input(s): GLUCAP in the last 168 hours. Lipid Profile: No results for input(s): CHOL, HDL, LDLCALC, TRIG, CHOLHDL, LDLDIRECT in the last 72 hours. Thyroid Function Tests: No results for input(s): TSH, T4TOTAL, FREET4, T3FREE, THYROIDAB in the last 72 hours. Anemia Panel: No results for input(s): VITAMINB12, FOLATE, FERRITIN, TIBC, IRON, RETICCTPCT in the last 72 hours. Urine analysis:     Component Value Date/Time   COLORURINE YELLOW 01/27/2020 1841   APPEARANCEUR CLEAR 01/27/2020 1841   APPEARANCEUR Clear 08/03/2019 1143   LABSPEC 1.015 01/27/2020 1841   PHURINE 6.0 01/27/2020 1841   GLUCOSEU NEGATIVE 01/27/2020 1841   HGBUR SMALL (Leonard) 01/27/2020 1841   BILIRUBINUR NEGATIVE 01/27/2020 1841   BILIRUBINUR small (Leonard) 08/03/2019 1144   BILIRUBINUR Negative 08/03/2019 1143   KETONESUR 5 (Leonard) 01/27/2020 1841   PROTEINUR 30 (Leonard) 01/27/2020 1841   UROBILINOGEN 1.0 08/03/2019 1144   UROBILINOGEN 1.0 01/25/2013 0825   NITRITE NEGATIVE 01/27/2020 1841   LEUKOCYTESUR NEGATIVE 01/27/2020 1841   Sepsis Labs: '@LABRCNTIP'$ (procalcitonin:4,lacticidven:4)  ) Recent Results (from the past 240 hour(s))  SARS CORONAVIRUS 2 (TAT 6-24 HRS) Nasopharyngeal Nasopharyngeal Swab     Status: None   Collection Time: 03/18/20  6:21 AM   Specimen: Nasopharyngeal Swab  Result Value Ref Range Status   SARS Coronavirus 2 NEGATIVE NEGATIVE Final    Comment: (NOTE) SARS-CoV-2 target nucleic acids are NOT DETECTED. The SARS-CoV-2 RNA is generally detectable in upper and lower respiratory specimens during the acute phase of infection. Negative results do not preclude SARS-CoV-2 infection, do not rule out co-infections with other pathogens, and should not be used as the sole basis for treatment or other patient management decisions. Negative results must be combined with clinical observations, patient history, and epidemiological information. The expected result is Negative. Fact Sheet for Patients: SugarRoll.be Fact Sheet for Healthcare Providers: https://www.woods-mathews.com/ This test is not yet approved or cleared by the [place] FDA and  has been authorized for detection and/or diagnosis of SARS-CoV-2 by FDA under an Emergency Use Authorization (EUA). This EUA will remain  in effect (meaning this test can be used) for the duration of the COVID-19  declaration under Section 56 4(b)(1) of the Act, 21 U.S.C. section 360bbb-3(b)(1), unless the authorization is terminated or revoked sooner. Performed at [facility], [place] [address], [place], [place] [address]   Surgical pcr screen     Status: Abnormal   Collection Time: 03/20/20  6:54 PM  Specimen: Nasal Mucosa; Nasal Swab  Result Value Ref Range Status   MRSA, PCR POSITIVE (Leonard) NEGATIVE Final    Comment: RESULT CALLED TO, READ BACK BY AND VERIFIED WITH: K.GOAD,RN AT 2044 12/22/19 BY L.PITT    Staphylococcus aureus POSITIVE (Leonard) NEGATIVE Final    Comment: (NOTE) The Xpert SA Assay (FDA approved for NASAL specimens in patients 21 years of age and older), is one component of Leonard comprehensive surveillance program. It is not intended to diagnose infection nor to guide or monitor treatment.       Studies: No results found.  Scheduled Meds: . docusate sodium  100 mg Oral BID  . enoxaparin (LOVENOX) injection  1 mg/kg Subcutaneous Q12H  . folic acid  1 mg Oral Daily  . gabapentin  100 mg Oral TID  . metoprolol succinate  50 mg Oral Daily  . multivitamin with minerals  1 tablet Oral Daily  . mupirocin ointment   Nasal BID  . nicotine  21 mg Transdermal Daily  . sodium chloride flush  3 mL Intravenous Once  . sodium chloride flush  3 mL Intravenous Q12H  . thiamine  100 mg Oral Daily   Or  . thiamine  100 mg Intravenous Daily  . warfarin  5 mg Oral ONCE-1600  . Warfarin - Pharmacist Dosing Inpatient   Does not apply q1600    Continuous Infusions: . sodium chloride       LOS: 6 days     Alma Friendly, MD [facility]  If 7PM-7AM, please contact night-coverage [email or web address] 03/25/2020, 3:18 PM

## 2020-03-25 NOTE — TOC Progression Note (Signed)
Transition of Care [facility]) - Progression Note    Patient Details  Name: Leonard Bentley MRN: [id number] Date of Birth: 12-23-1960  Transition [facility]) CM/SW Contact  Jacalyn Lefevre Edson Snowball, RN Phone Number: 03/25/2020, 12:31 PM  Clinical Narrative:     Followed up with patient. Patient talked to his friend and he cannot stay with his friend, however his friend is "looking into rehab centers" for him to go to.   Explained to patient so is TOC team at the hospital and so far there are no rehab centers offering a bed.   Patient voiced understanding and states he has a few more friends who he can call. Patient asked NCM to come back tomorrow.   Expected Discharge Plan: Home/Self Care Barriers to Discharge: Continued Medical Work up  Expected Discharge Plan and Services Expected Discharge Plan: Home/Self Care In-house Referral: Financial Counselor Discharge Planning Services: CM Consult Post Acute Care Choice: NA Living arrangements for the past 2 months: Single Family Home                   DME Agency: NA       HH Arranged: NA           Social Determinants of Health (SDOH) Interventions    Readmission Risk Interventions Readmission Risk Prevention Plan 03/21/2020 03/20/2020 01/31/2020  Transportation Screening Complete Complete Complete  PCP or Specialist Appt within 5-7 Days - - Complete  PCP or Specialist Appt within 3-5 Days Complete - -  Home Care Screening - - Complete  Medication Review (RN CM) - - Complete  HRI or Home Care Consult Complete - -  Social Work Consult for [place] Planning/Counseling Complete - -  Palliative Care Screening Not Applicable - -  Medication Review ([place]) Referral to Pharmacy Referral to Pharmacy -  PCP or Specialist appointment within 3-5 days of discharge Patient refused Complete -  [name] or Home Care Consult Complete Complete -  SW Recovery Care/Counseling Consult Complete Complete -  Palliative Care Screening Not  Applicable Not Applicable -  [place] Not Applicable Not Applicable -  Some recent data might be hidden

## 2020-03-25 NOTE — TOC Progression Note (Signed)
Transition of Care [facility]) - Progression Note    Patient Details  Name: Leonard Bentley MRN: [id number] Date of Birth: 1961-03-11  Transition of Care [facility]) CM/SW [place], [place] Phone Number: 03/25/2020, 10:49 AM  Clinical Narrative:    Pt without any current SNF offers. Would need LOG placement due to Medicaid pending and no disability coverage at this time.  RNCM following also for [facility] placement if pt able to stay w/ friend.    Expected Discharge Plan: Home/Self Care Barriers to Discharge: Continued Medical Work up  Expected Discharge Plan and Services Expected Discharge Plan: Home/Self Care In-house Referral: Financial Counselor Discharge Planning Services: CM Consult Post Acute Care Choice: NA Living arrangements for the past 2 months: Single Family Home DME Agency: NA HH Arranged: NA  Readmission Risk Interventions Readmission Risk Prevention Plan 03/21/2020 03/20/2020 01/31/2020  Transportation Screening Complete Complete Complete  PCP or Specialist Appt within 5-7 Days - - Complete  PCP or Specialist Appt within 3-5 Days Complete - -  Home Care Screening - - Complete  Medication Review (RN CM) - - Complete  HRI or Home Care Consult Complete - -  Social Work Consult for [place] Planning/Counseling Complete - -  Palliative Care Screening Not Applicable - -  Medication Review (RN Care Manager) Referral to Pharmacy Referral to Pharmacy -  PCP or Specialist appointment within 3-5 days of discharge Patient refused Complete -  [place] or Home Care Consult Complete Complete -  SW Recovery Care/Counseling Consult Complete Complete -  Palliative Care Screening Not Applicable Not Applicable -  [place] Not Applicable Not Applicable -  Some recent data might be hidden

## 2020-03-25 NOTE — Progress Notes (Addendum)
ANTICOAGULATION CONSULT NOTE - Follow Up Consult  Pharmacy Consult for Coumadin , Lovenox bridge Indication: h/o VTE  Allergies  Allergen Reactions  . Other Other (See Comments)    "Cactus" caused blisters on tongue (if prepared as food)    Patient Measurements: Height: 5' 9.5" (176.5 cm) Weight: 86 kg (189 lb 9.5 oz) IBW/kg (Calculated) : 71.85   Vital Signs: Temp: 98.6 F (37 C) (05/18 0513) Temp Source: Oral (05/18 0513) BP: 151/122 (05/18 0513) Pulse Rate: 88 (05/18 0513)  Labs: Recent Labs    03/23/20 0704 03/23/20 0704 03/24/20 0229 03/25/20 0326  HGB 9.8*   < > 9.6* 8.9*  HCT 31.4*  --  30.7* 27.5*  PLT 180  --  167 181  LABPROT 26.7*  --  21.1* 20.0*  INR 2.6*  --  1.9* 1.8*  CREATININE 0.86  --   --  0.80   < > = values in this interval not displayed.    Estimated Creatinine Clearance: 99.9 mL/min (by C-G formula based on SCr of 0.8 mg/dL).  Assessment:  59 y/o male with history of  VTE (recurrent PE), MV repair on warfarin PTA with a goal INR of 2-3. Also, patient went into Afib on 5/12 PM. Hemoglobin 8.9 today from  9.6, Platelet 181 stable.   INR decreased to 1.9 yesterday and today INR is 1.8. INR subtherapeutic today, trend down improved,  Anticipate INR to trend back up to goal 2-3. Will dose warfarin cautiously as needed to maintain INR goal and limit risk of bleeding, had R eye conjunctival hemorrhage on admit.  Note, patient had INR>10 on admit with PTA regimen 7.5mg  daily and received Vitamin K to reverse.  Started Lovenox bridge 1mg /kg SQ BID on 5/17 until INR = or >2  No bleeding noted.   Goal of Therapy:  INR 2-3 Monitor platelets by anticoagulation protocol: Yes   Plan:  Give warfarin 5 mg PO x1 this evening Lovenox 85 mg sq BID until INR = or >2.  Daily INR, monitor for s/sx of bleeding   Nicole Cella, RPh Clinical Pharmacist [phone] Please check AMION for all [place] phone numbers After 10:00 PM, call [place]  [phone] 03/25/2020 9:59 AM

## 2020-03-25 NOTE — Progress Notes (Signed)
Physical Therapy Treatment Patient Details Name: Leonard Bentley MRN: [id number] DOB: 02/23/1961 Today's Date: 03/25/2020    History of Present Illness Pt is a 59 y/o male admitted secondary to chest tightness. Pt found to have pulmonary edema with history of diastolic congestive heart.  Underwent Transmetatarsal amputation left foot with application Prevena wound VAC on 5/14; PMH including but not limited to HTN, chronic diastolic CHF, severe MR s/p mitral valve repair, recurrent PE, anxiety, non-Hodgkin's lymphoma, osteomyelitis of the left foot, and alcohol abuse.    PT Comments    Pt eager to mobilize with PT today, and participated well in transfer training from bed<>wheelchair, wheelchair mobility, and gait training with use of RW with good adherence to NWB LLE. Pt with tachycardia with mobility up to 153 bpm, recovers with rest. Pt requires cuing for safe mobility, will continue to benefit from acute PT and PT post-acutely at SNF, or if unable to place HHPT. PT to continue to follow acutely.   Follow Up Recommendations  Home health PT;SNF     Equipment Recommendations  Other (comment);Wheelchair (measurements PT);Wheelchair cushion (measurements PT)(shower seat for stair negotiation)    Recommendations for Other Services       Precautions / Restrictions Precautions Precautions: Fall Precaution Comments: wound vac Restrictions Weight Bearing Restrictions: Yes LLE Weight Bearing: Non weight bearing Other Position/Activity Restrictions: Strict NWB per Ortho note; post-op shoe donned    Mobility  Bed Mobility Overal bed mobility: Needs Assistance Bed Mobility: Supine to Sit;Sit to Supine     Supine to sit: Supervision;HOB elevated Sit to supine: Supervision;HOB elevated   General bed mobility comments: supervision for safety, verbal cuing for sequencing especially with LLE. use of bedrails to come to sitting.  Transfers Overall transfer level: Needs assistance Equipment  used: Rolling walker (2 wheeled) Transfers: Sit to/from Stand Sit to Stand: Min guard         General transfer comment: for safety, verbal cuing for hand placement when rising. Additional cuing for backing up to destination surface and reaching back prior to sitting for safety. Sit to stand x2, from EOB and w/c.  Ambulation/Gait Ambulation/Gait assistance: Min guard Gait Distance (Feet): 30 Feet Assistive device: Rolling walker (2 wheeled) Gait Pattern/deviations: Step-to pattern;Trunk flexed Gait velocity: decr   General Gait Details: Min guard for safety, with hop-to gait. Verbal cuing for taking rest breaks as needed, pushing through UEs, upright posture as tolerated.   Stairs             Information systems manager mobility: Yes Wheelchair propulsion: Both upper extremities Wheelchair parts: Supervision/cueing Distance: 300 Wheelchair Assistance Details (indicate cue type and reason): Supervision for safety, cuing for locking/unlocking w/c, elevating and lowering leg rests prior to initiating mobility. Verbal cuing during mobility for changing directions, watching LUE to ensure it did not hit any objects in environment, and propulsion technique.  Modified Rankin (Stroke Patients Only)       Balance Overall balance assessment: Needs assistance Sitting-balance support: No upper extremity supported;Feet supported Sitting balance-Leahy Scale: Good     Standing balance support: During functional activity;Bilateral upper extremity supported Standing balance-Leahy Scale: Poor Standing balance comment: reliant on external support in standing, especially dynamically                            Cognition Arousal/Alertness: Awake/alert Behavior During Therapy: WFL for tasks assessed/performed Overall Cognitive Status: Within Functional Limits for tasks assessed  General Comments: very  pleasant and motivated to participate in PT, pt able to state NWB cues      Exercises      General Comments        Pertinent Vitals/Pain Pain Assessment: 0-10 Pain Score: 7  Pain Location: L foot Pain Descriptors / Indicators: Aching;Discomfort;Grimacing;Guarding Pain Intervention(s): Limited activity within patient's tolerance;Monitored during session;Repositioned;Premedicated before session    Home Living                      Prior Function            PT Goals (current goals can now be found in the care plan section) Acute Rehab PT Goals Patient Stated Goal: for his foot to heal PT Goal Formulation: With patient Time For Goal Achievement: 04/05/20 Potential to Achieve Goals: Good Progress towards PT goals: Progressing toward goals    Frequency    Min 3X/week      PT Plan Current plan remains appropriate    Co-evaluation              AM-PAC PT "6 Clicks" Mobility   Outcome Measure  Help needed turning from your back to your side while in a flat bed without using bedrails?: A Little Help needed moving from lying on your back to sitting on the side of a flat bed without using bedrails?: A Little Help needed moving to and from a bed to a chair (including a wheelchair)?: A Little Help needed standing up from a chair using your arms (e.g., wheelchair or bedside chair)?: A Little Help needed to walk in hospital room?: A Little Help needed climbing 3-5 steps with a railing? : A Little 6 Click Score: 18    End of Session Equipment Utilized During Treatment: Gait belt Activity Tolerance: Patient tolerated treatment well Patient left: in bed;with call bell/phone within reach;with bed alarm set Nurse Communication: Mobility status PT Visit Diagnosis: Other abnormalities of gait and mobility (R26.89)     Time: [id number] PT Time Calculation (min) (ACUTE ONLY): 38 min  Charges:  $Gait Training: 8-22 mins $Therapeutic Activity: 8-22 mins                     Ashunti Schofield E, PT Acute Rehabilitation Services Pager [phone]  Office [phone]   Levent Kornegay D Dajanay Northrup 03/25/2020, 10:22 AM

## 2020-03-26 LAB — CBC WITH DIFFERENTIAL/PLATELET
Abs Immature Granulocytes: 0.01 10*3/uL (ref 0.00–0.07)
Basophils Absolute: 0 10*3/uL (ref 0.0–0.1)
Basophils Relative: 1 %
Eosinophils Absolute: 0.3 10*3/uL (ref 0.0–0.5)
Eosinophils Relative: 6 %
HCT: 28.5 % — ABNORMAL LOW (ref 39.0–52.0)
Hemoglobin: 8.9 g/dL — ABNORMAL LOW (ref 13.0–17.0)
Immature Granulocytes: 0 %
Lymphocytes Relative: 13 %
Lymphs Abs: 0.6 10*3/uL — ABNORMAL LOW (ref 0.7–4.0)
MCH: 24.7 pg — ABNORMAL LOW (ref 26.0–34.0)
MCHC: 31.2 g/dL (ref 30.0–36.0)
MCV: 79.2 fL — ABNORMAL LOW (ref 80.0–100.0)
Monocytes Absolute: 1 10*3/uL (ref 0.1–1.0)
Monocytes Relative: 22 %
Neutro Abs: 2.7 10*3/uL (ref 1.7–7.7)
Neutrophils Relative %: 58 %
Platelets: 211 10*3/uL (ref 150–400)
RBC: 3.6 MIL/uL — ABNORMAL LOW (ref 4.22–5.81)
RDW: 13.9 % (ref 11.5–15.5)
WBC: 4.6 10*3/uL (ref 4.0–10.5)
nRBC: 0 % (ref 0.0–0.2)

## 2020-03-26 LAB — BASIC METABOLIC PANEL
Anion gap: 9 (ref 5–15)
BUN: 12 mg/dL (ref 6–20)
CO2: 28 mmol/L (ref 22–32)
Calcium: 9.3 mg/dL (ref 8.9–10.3)
Chloride: 100 mmol/L (ref 98–111)
Creatinine, Ser: 0.83 mg/dL (ref 0.61–1.24)
GFR calc Af Amer: >60 mL/min (ref >60)
GFR calc non Af Amer: >60 mL/min (ref >60)
Glucose, Bld: 132 mg/dL — ABNORMAL HIGH (ref 70–99)
Potassium: 4.2 mmol/L (ref 3.5–5.1)
Sodium: 137 mmol/L (ref 135–145)

## 2020-03-26 LAB — PROTIME-INR
INR: 1.8 — ABNORMAL HIGH (ref 0.8–1.2)
Prothrombin Time: 20 seconds — ABNORMAL HIGH (ref 11.4–15.2)

## 2020-03-26 MED ORDER — WARFARIN SODIUM 7.5 MG PO TABS
7.5000 mg | ORAL_TABLET | Freq: Once | ORAL | Status: AC
  Administered 2020-03-26: 7.5 mg via ORAL
  Filled 2020-03-26: qty 1

## 2020-03-26 NOTE — Progress Notes (Addendum)
Progress Note    Leonard Bentley  [id number] DOB: 1961/10/09  DOA: 03/18/2020 PCP: [name], NP    Brief Narrative:     Medical records reviewed and are as summarized below:  Leonard Bentley is an 59 y.o. male with medical history significant ofHTN,chronic diastolic CHF, severe MRs/pmitral valve repair, recurrentPE, anxiety,non-Hodgkin's lymphoma, osteomyelitis of the left foot, and alcohol abuse presents with complaints of 1 week of chest tightness and shortness of breath.  Patient is status post transmetatarsal amputation for osteomyelitis.  Currently patient is pending safe discharge plan.  Assessment/Plan:   Principal Problem:   Pulmonary edema Active Problems:   Alcohol abuse   Cigarette smoker   Personal history of pulmonary embolism   Neuropathy due to chemotherapeutic drug ([place])   Anxiety   S/P minimally-invasive mitral valve repair   Supratherapeutic INR   Subacute osteomyelitis, left ankle and foot (HCC)   Wound dehiscence, surgical, initial encounter   Microcytic hypochromic anemia   Chest pain    Pulmonary edema/Acute on chronic diastolic HF/History of mitral valve repair BNP WNL, Troponins negative Chest x-ray significant for mild interstitial edema Echo showed EF noted to be 50-55% Strict intake and output, daily weight S/P IV diuresis Follow-up with cardiology as an outpatient  Supratherapeutic INR,R eye conjunctival hemorrhage On admission patient noted to have INR greater than 10 S/p 2.5 g of vitamin K p.o. Daily monitoring of PT/INR Pharmacy to dose  Wound dehiscence of left foot,history of osteomyelitis s/p transmetatarsal amputation with application of wound VAC on 03/21/2020 by Dr. [name] Last fever spike of 100.4 on 03/22/2020, with no leukocytosis Patient has history of osteomyelitis of the left foot for which he just recently had a third ray amputation by Dr. [name] on 01/30/20 CRP WNL, ESR minimally elevated  X-rays of the left  foot: suspicious for osteomyelitis  -Status post transmetatarsal amputation on 5/14  Alcohol abuse, with possible withdrawal Encouraged to continue alcohol cessation  Recurrent PEs on chronic anticoagulation D-dimer negative Patient with history of recurrent PEs on anticoagulation of Coumadin Coumadin per pharmacy  Neuropathy related with chemotherapy, history of Non-Hodgkin's lymphoma Patient with history of neuropathy thought to be related with chemotherapy drugs to treat non-Hodgkin's lymphoma Previously had been treated with systemic chemotherapy of CHOP and Rituxan last given in 11/2011 Continue gabapentin  Anxiety Continue Xanax as needed  Tobacco abuse Continue nicotine patch    Family Communication/Anticipated D/C date and plan/Code Status   DVT prophylaxis: Coumadin Code Status: Full Code.  Disposition Plan: Status is: Inpatient  Remains inpatient appropriate because:Unsafe d/c plan   Dispo: The patient is from: Home              Anticipated d/c is to: SNF              Anticipated d/c date is: 1 [place]              Patient currently is medically stable to d/c.          Medical Consultants:    Orthopedics  Subjective:   Denies shortness of breath, denies chest pain  Objective:    Vitals:   03/25/20 1304 03/25/20 1741 03/25/20 2305 03/26/20 0545  BP: 107/84 113/85 104/64 (!) 120/93  Pulse: 89 100 (!) 102 90  Resp: '17 17 16 16  '$ Temp: 98.4 F (36.9 C) 99.3 F (37.4 C) 98.1 F (36.7 C) 97.6 F (36.4 C)  TempSrc: Oral Oral Oral Oral  SpO2: 100% 100%  97% 99%  Weight:      Height:        Intake/Output Summary (Last 24 hours) at 03/26/2020 0758 Last data filed at 03/26/2020 0500 Gross per 24 hour  Intake 240 ml  Output 1250 ml  Net -1010 ml   Filed Weights   03/21/20 0512 03/21/20 1455 03/24/20 0500  Weight: 85.5 kg 84.4 kg 86 kg    Exam:  General: Appearance:     Well developed, well nourished male in no acute distress  Eyes:    Right conjunctival hemorrhage  Lungs:     Clear to auscultation bilaterally, respirations unlabored  Heart:    Normal heart rate. Normal rhythm.   MS:   Left transmetatarsal amputation noted.   Neurologic:   Awake, alert, oriented x 3. No apparent focal neurological           defect.     Data Reviewed:   I have personally reviewed following labs and imaging studies:  Labs: Labs show the following:   Basic Metabolic Panel: Recent Labs  Lab 03/21/20 0623 03/21/20 0623 03/22/20 0307 03/22/20 0307 03/23/20 0704 03/23/20 0704 03/25/20 0326 03/26/20 0244  NA 136  --  135  --  135  --  135 137  K 3.8   < > 4.4   < > 4.0   < > 3.8 4.2  CL 99  --  98  --  98  --  100 100  CO2 28  --  31  --  25  --  26 28  GLUCOSE 98  --  110*  --  109*  --  107* 132*  BUN 25*  --  19  --  12  --  11 12  CREATININE 1.00  --  0.94  --  0.86  --  0.80 0.83  CALCIUM 9.1  --  9.0  --  9.1  --  8.9 9.3   < > = values in this interval not displayed.   GFR Estimated Creatinine Clearance: 96.3 mL/min (by C-G formula based on SCr of 0.83 mg/dL). Liver Function Tests: No results for input(s): AST, ALT, ALKPHOS, BILITOT, PROT, ALBUMIN in the last 168 hours. No results for input(s): LIPASE, AMYLASE in the last 168 hours. No results for input(s): AMMONIA in the last 168 hours. Coagulation profile Recent Labs  Lab 03/22/20 0307 03/23/20 0704 03/24/20 0229 03/25/20 0326 03/26/20 0244  INR 3.0* 2.6* 1.9* 1.8* 1.8*    CBC: Recent Labs  Lab 03/22/20 0307 03/23/20 0704 03/24/20 0229 03/25/20 0326 03/26/20 0244  WBC 6.8 6.5 6.4 5.2 4.6  NEUTROABS 5.5 4.4 4.5 3.2 2.7  HGB 10.8* 9.8* 9.6* 8.9* 8.9*  HCT 34.3* 31.4* 30.7* 27.5* 28.5*  MCV 79.0* 79.9* 79.9* 79.5* 79.2*  PLT 246 180 167 181 211   Cardiac Enzymes: No results for input(s): CKTOTAL, CKMB, CKMBINDEX, TROPONINI in the last 168 hours. BNP (last 3 results) No results for input(s): PROBNP in the last 8760 hours. CBG: No results for  input(s): GLUCAP in the last 168 hours. D-Dimer: No results for input(s): DDIMER in the last 72 hours. Hgb A1c: No results for input(s): HGBA1C in the last 72 hours. Lipid Profile: No results for input(s): CHOL, HDL, LDLCALC, TRIG, CHOLHDL, LDLDIRECT in the last 72 hours. Thyroid function studies: No results for input(s): TSH, T4TOTAL, T3FREE, THYROIDAB in the last 72 hours.  Invalid input(s): FREET3 Anemia work up: No results for input(s): VITAMINB12, FOLATE, FERRITIN, TIBC, IRON, RETICCTPCT in the last 58  hours. Sepsis Labs: Recent Labs  Lab 03/23/20 0704 03/24/20 0229 03/25/20 0326 03/26/20 0244  WBC 6.5 6.4 5.2 4.6    Microbiology Recent Results (from the past 240 hour(s))  SARS CORONAVIRUS 2 (TAT 6-24 HRS) Nasopharyngeal Nasopharyngeal Swab     Status: None   Collection Time: 03/18/20  6:21 AM   Specimen: Nasopharyngeal Swab  Result Value Ref Range Status   SARS Coronavirus 2 NEGATIVE NEGATIVE Final    Comment: (NOTE) SARS-CoV-2 target nucleic acids are NOT DETECTED. The SARS-CoV-2 RNA is generally detectable in upper and lower respiratory specimens during the acute phase of infection. Negative results do not preclude SARS-CoV-2 infection, do not rule out co-infections with other pathogens, and should not be used as the sole basis for treatment or other patient management decisions. Negative results must be combined with clinical observations, patient history, and epidemiological information. The expected result is Negative. Fact Sheet for Patients: SugarRoll.be Fact Sheet for Healthcare Providers: https://www.woods-mathews.com/ This test is not yet approved or cleared by the [place] FDA and  has been authorized for detection and/or diagnosis of SARS-CoV-2 by FDA under an Emergency Use Authorization (EUA). This EUA will remain  in effect (meaning this test can be used) for the duration of the COVID-19 declaration under  Section 56 4(b)(1) of the Act, 21 U.S.C. section 360bbb-3(b)(1), unless the authorization is terminated or revoked sooner. Performed at [facility], [place] [address], [place], [place] [address]   Surgical pcr screen     Status: Abnormal   Collection Time: 03/20/20  6:54 PM   Specimen: Nasal Mucosa; Nasal Swab  Result Value Ref Range Status   MRSA, PCR POSITIVE (A) NEGATIVE Final    Comment: RESULT CALLED TO, READ BACK BY AND VERIFIED WITH: K.GOAD,RN AT 2044 12/22/19 BY L.PITT    Staphylococcus aureus POSITIVE (A) NEGATIVE Final    Comment: (NOTE) The Xpert SA Assay (FDA approved for NASAL specimens in patients 25 years of age and older), is one component of a comprehensive surveillance program. It is not intended to diagnose infection nor to guide or monitor treatment.     Procedures and diagnostic studies:  No results found.  Medications:   . docusate sodium  100 mg Oral BID  . enoxaparin (LOVENOX) injection  1 mg/kg Subcutaneous Q12H  . folic acid  1 mg Oral Daily  . gabapentin  100 mg Oral TID  . metoprolol succinate  50 mg Oral Daily  . multivitamin with minerals  1 tablet Oral Daily  . mupirocin ointment   Nasal BID  . nicotine  21 mg Transdermal Daily  . sodium chloride flush  3 mL Intravenous Once  . sodium chloride flush  3 mL Intravenous Q12H  . thiamine  100 mg Oral Daily   Or  . thiamine  100 mg Intravenous Daily  . Warfarin - Pharmacist Dosing Inpatient   Does not apply q1600   Continuous Infusions: . sodium chloride       LOS: 7 days   [name]  [facility]   How to contact the [facility] Attending or Consulting provider [place] or covering provider during after hours [place], for this patient?  1. Check the care team in [facility] and look for a) attending/consulting TR[facility] provider listed and b) the [facility] team listed 2. Log into [email or web address] and use [place]'s universal password to access. If you do not have the password, please contact the  hospital operator. 3. Locate the [facility] provider  you are looking for under [facility] and page to a number that you can be directly reached. 4. If you still have difficulty reaching the provider, please page the [facility] (Director on Call) for the Hospitalists listed on amion for assistance.  03/26/2020, 7:58 AM

## 2020-03-26 NOTE — TOC Progression Note (Signed)
Transition of Care [facility]) - Progression Note    Patient Details  Name: Leonard Bentley MRN: [id number] Date of Birth: 02/24/1961  Transition of [facility]) CM/SW Contact  Jacalyn Lefevre Edson [place], RN Phone Number: 03/26/2020, 11:50 AM  Clinical Narrative:     Followed up with patient regarding disposition. TOC still has no SNF bed offers. Patient aware. Patient still calling friends/family to see if he can stay with them.   Expected Discharge Plan: Home/Self Care Barriers to Discharge: Continued Medical Work up  Expected Discharge Plan and Services Expected Discharge Plan: Home/Self Care In-house Referral: Financial Counselor Discharge Planning Services: CM Consult Post Acute Care Choice: NA Living arrangements for the past 2 months: Single Family Home                   DME Agency: NA       HH Arranged: NA           Social Determinants of Health (SDOH) Interventions    Readmission Risk Interventions Readmission Risk Prevention Plan 03/21/2020 03/20/2020 01/31/2020  Transportation Screening Complete Complete Complete  PCP or Specialist Appt within 5-7 Days - - Complete  PCP or Specialist Appt within 3-5 Days Complete - -  Home Care Screening - - Complete  Medication Review (RN CM) - - Complete  HRI or Home Care Consult Complete - -  Social Work Consult for [place] Planning/Counseling Complete - -  Palliative Care Screening Not Applicable - -  Medication Review ([place]) Referral to Pharmacy Referral to Pharmacy -  PCP or Specialist appointment within 3-5 days of discharge Patient refused Complete -  [place] or Home Care Consult Complete Complete -  SW Recovery Care/Counseling Consult Complete Complete -  Palliative Care Screening Not Applicable Not Applicable -  [place] Not Applicable Not Applicable -  Some recent data might be hidden

## 2020-03-26 NOTE — Progress Notes (Signed)
ANTICOAGULATION CONSULT NOTE - Follow Up Consult  Pharmacy Consult for Coumadin , Lovenox bridge Indication: h/o VTE  Allergies  Allergen Reactions  . Other Other (See Comments)    "Cactus" caused blisters on tongue (if prepared as food)    Patient Measurements: Height: 5' 9.5" (176.5 cm) Weight: 86 kg (189 lb 9.5 oz) IBW/kg (Calculated) : 71.85   Vital Signs: Temp: 97.6 F (36.4 C) (05/19 0545) Temp Source: Oral (05/19 0545) BP: 120/93 (05/19 0545) Pulse Rate: 90 (05/19 0545)  Labs: Recent Labs    03/24/20 0229 03/24/20 0229 03/25/20 0326 03/26/20 0244  HGB 9.6*   < > 8.9* 8.9*  HCT 30.7*  --  27.5* 28.5*  PLT 167  --  181 211  LABPROT 21.1*  --  20.0* 20.0*  INR 1.9*  --  1.8* 1.8*  CREATININE  --   --  0.80 0.83   < > = values in this interval not displayed.    Estimated Creatinine Clearance: 96.3 mL/min (by C-G formula based on SCr of 0.83 mg/dL).  Assessment:  59 y/o male with history of  VTE (recurrent PE), MV repair on warfarin PTA with a goal INR of 2-3. Also, patient went into Afib on 5/12 PM.  INR trend  3>2.6>1.9 >1.8>1.8 today,   s/p Vit K 2.5mg  po on 5/11> Warfarin resumed 5/13 with doses lower than PTA (INR >10 on admit)>>Lovenox bridge added 5/17  Today 03/26/20 INR subtherapeutic, INR same as yesterday, anticipate INR should start to increase to goal reflecting the doses given in last 2-3 days.  Will dose warfarin cautiously as needed to maintain INR goal and limit risk of bleeding, had R eye conjunctival hemorrhage on admit.  Started Lovenox bridge 1mg /kg SQ BID on 5/17 to continue until INR = or >2, Weight is 86kg, CrCl 96 ml/min. Hemoglobin low stable at 8.9 and Platelet  wnl stable.   No bleeding noted.   Goal of Therapy:  INR 2-3 Monitor platelets by anticoagulation protocol: Yes   Plan:  Give warfarin 7.5 mg PO x1 today Lovenox 85 mg sq BID until INR = or >2.  Daily INR, monitor for s/sx of bleeding  Thank you for allowing pharmacy to be  part of this patients care team.  Nicole Cella, RPh Clinical Pharmacist [phone] Please check AMION for all [place] phone numbers After 10:00 PM, call [place] 03/26/2020 11:20 AM

## 2020-03-27 LAB — CBC WITH DIFFERENTIAL/PLATELET
Abs Immature Granulocytes: 0.01 10*3/uL (ref 0.00–0.07)
Basophils Absolute: 0 10*3/uL (ref 0.0–0.1)
Basophils Relative: 1 %
Eosinophils Absolute: 0.3 10*3/uL (ref 0.0–0.5)
Eosinophils Relative: 7 %
HCT: 30.3 % — ABNORMAL LOW (ref 39.0–52.0)
Hemoglobin: 9.4 g/dL — ABNORMAL LOW (ref 13.0–17.0)
Immature Granulocytes: 0 %
Lymphocytes Relative: 14 %
Lymphs Abs: 0.5 10*3/uL — ABNORMAL LOW (ref 0.7–4.0)
MCH: 24.6 pg — ABNORMAL LOW (ref 26.0–34.0)
MCHC: 31 g/dL (ref 30.0–36.0)
MCV: 79.3 fL — ABNORMAL LOW (ref 80.0–100.0)
Monocytes Absolute: 0.7 10*3/uL (ref 0.1–1.0)
Monocytes Relative: 18 %
Neutro Abs: 2.3 10*3/uL (ref 1.7–7.7)
Neutrophils Relative %: 60 %
Platelets: 241 10*3/uL (ref 150–400)
RBC: 3.82 MIL/uL — ABNORMAL LOW (ref 4.22–5.81)
RDW: 13.9 % (ref 11.5–15.5)
WBC: 3.8 10*3/uL — ABNORMAL LOW (ref 4.0–10.5)
nRBC: 0 % (ref 0.0–0.2)

## 2020-03-27 LAB — BASIC METABOLIC PANEL
Anion gap: 8 (ref 5–15)
BUN: 10 mg/dL (ref 6–20)
CO2: 27 mmol/L (ref 22–32)
Calcium: 9.2 mg/dL (ref 8.9–10.3)
Chloride: 101 mmol/L (ref 98–111)
Creatinine, Ser: 0.75 mg/dL (ref 0.61–1.24)
GFR calc Af Amer: >60 mL/min (ref >60)
GFR calc non Af Amer: >60 mL/min (ref >60)
Glucose, Bld: 103 mg/dL — ABNORMAL HIGH (ref 70–99)
Potassium: 4.2 mmol/L (ref 3.5–5.1)
Sodium: 136 mmol/L (ref 135–145)

## 2020-03-27 LAB — PROTIME-INR
INR: 1.9 — ABNORMAL HIGH (ref 0.8–1.2)
Prothrombin Time: 21.1 seconds — ABNORMAL HIGH (ref 11.4–15.2)

## 2020-03-27 MED ORDER — WARFARIN SODIUM 7.5 MG PO TABS
7.5000 mg | ORAL_TABLET | Freq: Once | ORAL | Status: AC
  Administered 2020-03-27: 7.5 mg via ORAL
  Filled 2020-03-27: qty 1

## 2020-03-27 MED ORDER — OXYCODONE HCL 5 MG PO TABS
5.0000 mg | ORAL_TABLET | Freq: Four times a day (QID) | ORAL | Status: DC | PRN
  Administered 2020-03-27 – 2020-04-01 (×12): 10 mg via ORAL
  Administered 2020-04-01: 5 mg via ORAL
  Administered 2020-04-02 – 2020-04-03 (×3): 10 mg via ORAL
  Administered 2020-04-03 – 2020-04-04 (×3): 5 mg via ORAL
  Filled 2020-03-27 (×2): qty 2
  Filled 2020-03-27: qty 1
  Filled 2020-03-27 (×6): qty 2
  Filled 2020-03-27: qty 1
  Filled 2020-03-27 (×11): qty 2

## 2020-03-27 NOTE — Progress Notes (Signed)
Occupational Therapy Treatment Patient Details Name: Leonard Bentley MRN: [id number] DOB: Mar 21, 1961 Today's Date: 03/27/2020    History of present illness Pt is a 59 y/o male admitted secondary to chest tightness. Pt found to have pulmonary edema with history of diastolic congestive heart.  Underwent Transmetatarsal amputation left foot with application Prevena wound VAC on 5/14; PMH including but not limited to HTN, chronic diastolic CHF, severe MR s/p mitral valve repair, recurrent PE, anxiety, non-Hodgkin's lymphoma, osteomyelitis of the left foot, and alcohol abuse.   OT comments  Patient progressing well towards OT goals. Min guard for ambulation + functional transfers for safety especially with managing wound vac line as patient gets it tangled in R LE. Patient perform multiple sit to stand at sink during g/h and LB bathing with min cues to maintain NWB status. Will continue to follow.   Follow Up Recommendations  Home health OT;Supervision - Intermittent;SNF;Other (comment)(HH if unable to find placement )    Equipment Recommendations  Tub/shower seat       Precautions / Restrictions Precautions Precautions: Fall Precaution Comments: wound vac Restrictions Weight Bearing Restrictions: Yes LLE Weight Bearing: Non weight bearing Other Position/Activity Restrictions: Strict NWB per Ortho note; post-op shoe donned       Mobility Bed Mobility               General bed mobility comments: seated edge of bed upon arrival  Transfers Overall transfer level: Needs assistance Equipment used: Rolling walker (2 wheeled) Transfers: Sit to/from Stand Sit to Stand: Min guard         General transfer comment: for safety    Balance Overall balance assessment: Needs assistance Sitting-balance support: No upper extremity supported;Feet supported Sitting balance-Leahy Scale: Good     Standing balance support: During functional activity;Bilateral upper extremity  supported Standing balance-Leahy Scale: Poor Standing balance comment: reliant on external support in standing, especially dynamically                           ADL either performed or assessed with clinical judgement   ADL Overall ADL's : Needs assistance/impaired     Grooming: Set up;Oral care;Wash/dry face;Wash/dry hands;Sitting   Upper Body Bathing: Set up;Sitting   Lower Body Bathing: Supervison/ safety;Cueing for safety;Sit to/from stand Lower Body Bathing Details (indicate cue type and reason): min cues for maintaining NWB in standing      Lower Body Dressing: Set up;Sit to/from stand Lower Body Dressing Details (indicate cue type and reason): to don clean sock to R foot Toilet Transfer: Min guard;RW;Ambulation;Comfort height toilet Toilet Transfer Details (indicate cue type and reason): min guard for safety especially with safety managing wound vac line Toileting- Clothing Manipulation and Hygiene: Independent;Sitting/lateral lean       Functional mobility during ADLs: Min guard;Cueing for safety;Rolling walker                 Cognition Arousal/Alertness: Awake/alert Behavior During Therapy: WFL for tasks assessed/performed Overall Cognitive Status: Within Functional Limits for tasks assessed                                                     Pertinent Vitals/ Pain       Pain Assessment: Faces Faces Pain Scale: Hurts little more Pain Location: L foot Pain Descriptors /  Indicators: Aching;Throbbing Pain Intervention(s): Monitored during session         Frequency  Min 2X/week        Progress Toward Goals  OT Goals(current goals can now be found in the care plan section)  Progress towards OT goals: Progressing toward goals  Acute Rehab OT Goals Patient Stated Goal: for his foot to heal OT Goal Formulation: With patient Time For Goal Achievement: 04/05/20 Potential to Achieve Goals: Good ADL Goals Pt Will  Perform Lower Body Bathing: with modified independence;sitting/lateral leans;sit to/from stand;with adaptive equipment Pt Will Perform Lower Body Dressing: with modified independence;sitting/lateral leans;sit to/from stand;with adaptive equipment Pt Will Transfer to Toilet: with modified independence;ambulating;regular height toilet;grab bars Pt Will Perform Tub/Shower Transfer: with modified independence;ambulating;shower seat;rolling walker Additional ADL Goal #1: Pt will maintain NWB precautions with all functional mobility  Plan Discharge plan remains appropriate       AM-PAC OT "6 Clicks" Daily Activity     Outcome Measure   Help from another person eating meals?: None Help from another person taking care of personal grooming?: None Help from another person toileting, which includes using toliet, bedpan, or urinal?: A Little Help from another person bathing (including washing, rinsing, drying)?: A Little Help from another person to put on and taking off regular upper body clothing?: None Help from another person to put on and taking off regular lower body clothing?: A Little 6 Click Score: 21    End of Session Equipment Utilized During Treatment: Rolling walker(darco shoe)  OT Visit Diagnosis: Other abnormalities of gait and mobility (R26.89);Unsteadiness on feet (R26.81);Pain Pain - Right/Left: Left Pain - part of body: Ankle and joints of foot   Activity Tolerance Patient tolerated treatment well   Patient Left in chair;with call bell/phone within reach           Time: 0820-0914 OT Time Calculation (min): 54 min  Charges: OT General Charges $OT Visit: 1 Visit OT Treatments $Self Care/Home Management : 53-67 mins  Leonard Bentley OT OT office: [place] 03/27/2020, 2:20 PM

## 2020-03-27 NOTE — Progress Notes (Signed)
Progress Note    Leonard Bentley  [id number] DOB: Dec 01, 1960  DOA: 03/18/2020 PCP: [name], NP    Brief Narrative:     Medical records reviewed and are as summarized below:  Leonard Bentley is an 59 y.o. male with medical history significant ofHTN,chronic diastolic CHF, severe MRs/pmitral valve repair, recurrentPE, anxiety,non-Hodgkin's lymphoma, osteomyelitis of the left foot, and alcohol abuse presents with complaints of 1 week of chest tightness and shortness of breath.  Patient is status post transmetatarsal amputation for osteomyelitis.  Currently patient is pending safe discharge plan.  Assessment/Plan:   Principal Problem:   Pulmonary edema Active Problems:   Alcohol abuse   Cigarette smoker   Personal history of pulmonary embolism   Neuropathy due to chemotherapeutic drug ([place])   Anxiety   S/P minimally-invasive mitral valve repair   Supratherapeutic INR   Subacute osteomyelitis, left ankle and foot (HCC)   Wound dehiscence, surgical, initial encounter   Microcytic hypochromic anemia   Chest pain    Pulmonary edema/Acute on chronic diastolic HF/History of mitral valve repair BNP WNL, Troponins negative Chest x-ray significant for mild interstitial edema Echo showed EF noted to be 50-55% Strict intake and output, daily weight S/P IV diuresis Follow-up with cardiology as an outpatient  Supratherapeutic INR,R eye conjunctival hemorrhage On admission patient noted to have INR greater than 10 S/p 2.5 g of vitamin K p.o. Daily monitoring of PT/INR (being bridged with lovenox) Pharmacy to dose  Wound dehiscence of left foot,history of osteomyelitis s/p transmetatarsal amputation with application of wound VAC on 03/21/2020 by Dr. [name] Last fever spike of 100.4 on 03/22/2020, with no leukocytosis Patient has history of osteomyelitis of the left foot for which he just recently had a third ray amputation by Dr. [name] on 01/30/20 CRP WNL, ESR minimally  elevated  X-rays of the left foot: suspicious for osteomyelitis  -Status post transmetatarsal amputation on 5/14  Alcohol abuse, with possible withdrawal Encouraged to continue alcohol cessation  Recurrent PEs on chronic anticoagulation D-dimer negative Patient with history of recurrent PEs on anticoagulation of Coumadin Coumadin per pharmacy  Neuropathy related with chemotherapy, history of Non-Hodgkin's lymphoma Patient with history of neuropathy thought to be related with chemotherapy drugs to treat non-Hodgkin's lymphoma Previously had been treated with systemic chemotherapy of CHOP and Rituxan last given in 11/2011 Continue gabapentin  Anxiety Continue Xanax as needed  Tobacco abuse Continue nicotine patch    Family Communication/Anticipated D/C date and plan/Code Status   DVT prophylaxis: Coumadin Code Status: Full Code.  Disposition Plan: Status is: Inpatient  Remains inpatient appropriate because:Unsafe d/c plan   Dispo: The patient is from: Home              Anticipated d/c is to: SNF/home?              Anticipated d/c date is: when safe d/c plan in place              Patient currently is medically stable to d/c.          Medical Consultants:    Orthopedics  Subjective:   Hit foot last night on bed  Objective:    Vitals:   03/26/20 1830 03/27/20 0006 03/27/20 0433 03/27/20 0552  BP: 121/76 123/87 104/86   Pulse: 88 93 86   Resp: '18 18 18   '$ Temp: 98.4 F (36.9 C) 98.9 F (37.2 C) 99.4 F (37.4 C)   TempSrc: Oral Oral Oral   SpO2:  100% 97% 97%   Weight:    86.4 kg  Height:        Intake/Output Summary (Last 24 hours) at 03/27/2020 1107 Last data filed at 03/27/2020 0008 Gross per 24 hour  Intake --  Output 800 ml  Net -800 ml   Filed Weights   03/21/20 1455 03/24/20 0500 03/27/20 0552  Weight: 84.4 kg 86 kg 86.4 kg    Exam:  General: Appearance:     Overweight male in no acute distress  Eyes:    Right conjunctival  hemorrhage   Lungs:     Clear to auscultation bilaterally, respirations unlabored  Heart:    Normal heart rate. Normal rhythm.    MS:   Left trans-metatarsal amputation  Neurologic:   Awake, alert, oriented x 3. No apparent focal neurological           defect.      Data Reviewed:   I have personally reviewed following labs and imaging studies:  Labs: Labs show the following:   Basic Metabolic Panel: Recent Labs  Lab 03/22/20 0307 03/22/20 0307 03/23/20 0704 03/23/20 0704 03/25/20 0326 03/25/20 0326 03/26/20 0244 03/27/20 0558  NA 135  --  135  --  135  --  137 136  K 4.4   < > 4.0   < > 3.8   < > 4.2 4.2  CL 98  --  98  --  100  --  100 101  CO2 31  --  25  --  26  --  28 27  GLUCOSE 110*  --  109*  --  107*  --  132* 103*  BUN 19  --  12  --  11  --  12 10  CREATININE 0.94  --  0.86  --  0.80  --  0.83 0.75  CALCIUM 9.0  --  9.1  --  8.9  --  9.3 9.2   < > = values in this interval not displayed.   GFR Estimated Creatinine Clearance: 107.9 mL/min (by C-G formula based on SCr of 0.75 mg/dL). Liver Function Tests: No results for input(s): AST, ALT, ALKPHOS, BILITOT, PROT, ALBUMIN in the last 168 hours. No results for input(s): LIPASE, AMYLASE in the last 168 hours. No results for input(s): AMMONIA in the last 168 hours. Coagulation profile Recent Labs  Lab 03/23/20 0704 03/24/20 0229 03/25/20 0326 03/26/20 0244 03/27/20 0558  INR 2.6* 1.9* 1.8* 1.8* 1.9*    CBC: Recent Labs  Lab 03/23/20 0704 03/24/20 0229 03/25/20 0326 03/26/20 0244 03/27/20 0558  WBC 6.5 6.4 5.2 4.6 3.8*  NEUTROABS 4.4 4.5 3.2 2.7 2.3  HGB 9.8* 9.6* 8.9* 8.9* 9.4*  HCT 31.4* 30.7* 27.5* 28.5* 30.3*  MCV 79.9* 79.9* 79.5* 79.2* 79.3*  PLT 180 167 181 211 241   Cardiac Enzymes: No results for input(s): CKTOTAL, CKMB, CKMBINDEX, TROPONINI in the last 168 hours. BNP (last 3 results) No results for input(s): PROBNP in the last 8760 hours. CBG: No results for input(s): GLUCAP in  the last 168 hours. D-Dimer: No results for input(s): DDIMER in the last 72 hours. Hgb A1c: No results for input(s): HGBA1C in the last 72 hours. Lipid Profile: No results for input(s): CHOL, HDL, LDLCALC, TRIG, CHOLHDL, LDLDIRECT in the last 72 hours. Thyroid function studies: No results for input(s): TSH, T4TOTAL, T3FREE, THYROIDAB in the last 72 hours.  Invalid input(s): FREET3 Anemia work up: No results for input(s): VITAMINB12, FOLATE, FERRITIN, TIBC, IRON, RETICCTPCT in the last  72 hours. Sepsis Labs: Recent Labs  Lab 03/24/20 0229 03/25/20 0326 03/26/20 0244 03/27/20 0558  WBC 6.4 5.2 4.6 3.8*    Microbiology Recent Results (from the past 240 hour(s))  SARS CORONAVIRUS 2 (TAT 6-24 HRS) Nasopharyngeal Nasopharyngeal Swab     Status: None   Collection Time: 03/18/20  6:21 AM   Specimen: Nasopharyngeal Swab  Result Value Ref Range Status   SARS Coronavirus 2 NEGATIVE NEGATIVE Final    Comment: (NOTE) SARS-CoV-2 target nucleic acids are NOT DETECTED. The SARS-CoV-2 RNA is generally detectable in upper and lower respiratory specimens during the acute phase of infection. Negative results do not preclude SARS-CoV-2 infection, do not rule out co-infections with other pathogens, and should not be used as the sole basis for treatment or other patient management decisions. Negative results must be combined with clinical observations, patient history, and epidemiological information. The expected result is Negative. Fact Sheet for Patients: SugarRoll.be Fact Sheet for Healthcare Providers: https://www.woods-mathews.com/ This test is not yet approved or cleared by the [place] FDA and  has been authorized for detection and/or diagnosis of SARS-CoV-2 by FDA under an Emergency Use Authorization (EUA). This EUA will remain  in effect (meaning this test can be used) for the duration of the COVID-19 declaration under Section 56 4(b)(1)  of the Act, 21 U.S.C. section 360bbb-3(b)(1), unless the authorization is terminated or revoked sooner. Performed at [facility], [place] [address], [place], [place] [address]   Surgical pcr screen     Status: Abnormal   Collection Time: 03/20/20  6:54 PM   Specimen: Nasal Mucosa; Nasal Swab  Result Value Ref Range Status   MRSA, PCR POSITIVE (A) NEGATIVE Final    Comment: RESULT CALLED TO, READ BACK BY AND VERIFIED WITH: K.GOAD,RN AT 2044 12/22/19 BY L.PITT    Staphylococcus aureus POSITIVE (A) NEGATIVE Final    Comment: (NOTE) The Xpert SA Assay (FDA approved for NASAL specimens in patients 14 years of age and older), is one component of a comprehensive surveillance program. It is not intended to diagnose infection nor to guide or monitor treatment.     Procedures and diagnostic studies:  No results found.  Medications:   . docusate sodium  100 mg Oral BID  . enoxaparin (LOVENOX) injection  1 mg/kg Subcutaneous Q12H  . folic acid  1 mg Oral Daily  . gabapentin  100 mg Oral TID  . metoprolol succinate  50 mg Oral Daily  . multivitamin with minerals  1 tablet Oral Daily  . mupirocin ointment   Nasal BID  . nicotine  21 mg Transdermal Daily  . sodium chloride flush  3 mL Intravenous Once  . sodium chloride flush  3 mL Intravenous Q12H  . thiamine  100 mg Oral Daily   Or  . thiamine  100 mg Intravenous Daily  . warfarin  7.5 mg Oral ONCE-1600  . Warfarin - Pharmacist Dosing Inpatient   Does not apply q1600   Continuous Infusions: . sodium chloride       LOS: 8 days   [name]  [facility]   How to contact the [facility] Attending or Consulting provider [place] or covering provider during after hours [place], for this patient?  1. Check the care team in [facility] and look for a) attending/consulting TRH provider listed and b) the [facility] team listed 2. Log into [email or web address] and use [place] universal password to access. If you do not have the password, please  contact the hospital operator. 3. Locate the [facility] provider you are looking for under [facility] and page to a number that you can be directly reached. 4. If you still have difficulty reaching the provider, please page the [facility] (Director on Call) for the Hospitalists listed on amion for assistance.  03/27/2020, 11:07 AM

## 2020-03-27 NOTE — Progress Notes (Signed)
ANTICOAGULATION CONSULT NOTE - Follow Up Consult  Pharmacy Consult for Coumadin , Lovenox bridge Indication: h/o VTE  Allergies  Allergen Reactions  . Other Other (See Comments)    "Cactus" caused blisters on tongue (if prepared as food)    Patient Measurements: Height: 5' 9.5" (176.5 cm) Weight: 86.4 kg (190 lb 7.6 oz) IBW/kg (Calculated) : 71.85   Vital Signs: Temp: 99.4 F (37.4 C) (05/20 0433) Temp Source: Oral (05/20 0433) BP: 104/86 (05/20 0433) Pulse Rate: 86 (05/20 0433)  Labs: Recent Labs    03/25/20 0326 03/25/20 0326 03/26/20 0244 03/27/20 0558  HGB 8.9*   < > 8.9* 9.4*  HCT 27.5*  --  28.5* 30.3*  PLT 181  --  211 241  LABPROT 20.0*  --  20.0* 21.1*  INR 1.8*  --  1.8* 1.9*  CREATININE 0.80  --  0.83 0.75   < > = values in this interval not displayed.    Estimated Creatinine Clearance: 107.9 mL/min (by C-G formula based on SCr of 0.75 mg/dL).  Assessment:  59 y/o male with history of  VTE (recurrent PE), MV repair on warfarin PTA with a goal INR of 2-3. Also, patient went into Afib on 5/12 PM.  INR trend  3>2.6>1.9 >1.8>1.8 today,   s/p Vit K 2.5mg  po on 5/11> Warfarin resumed 5/13 with doses lower than PTA (INR >10 on admit)>>Lovenox bridge added 5/17  Today 03/26/20 INR subtherapeutic, INR same as yesterday, anticipate INR should start to increase to goal reflecting the doses given in last 2-3 days.  Will dose warfarin cautiously as needed to maintain INR goal and limit risk of bleeding, had R eye conjunctival hemorrhage on admit.  Started Lovenox bridge 1mg /kg SQ BID on 5/17 to continue until INR = or >2, Weight is 86kg, CrCl 96 ml/min. Hemoglobin 9.4, Platelet  wnl stable.   No bleeding noted.    INR is trending up slowly but almost at goal. Suspect that it will be in range in AM. We will repeat the 7.5mg  dose today.   Goal of Therapy:  INR 2-3 Monitor platelets by anticoagulation protocol: Yes   Plan:  Warfarin 7.5 mg PO x1 today Lovenox 85 mg  sq BID until INR = or >2.  Daily INR, monitor for s/sx of bleeding  Onnie Boer, PharmD, BCIDP, AAHIVP, CPP Infectious Disease Pharmacist 03/27/2020 9:39 AM

## 2020-03-27 NOTE — TOC Progression Note (Signed)
Transition of Care [facility]) - Progression Note    Patient Details  Name: Leonard Bentley MRN: [id number] Date of Birth: Sep 16, 1961  [facility] of [facility]) CM/SW Contact  Jacalyn Lefevre Edson Snowball, RN Phone Number: 03/27/2020, 4:24 PM  Clinical Narrative:     Still no SNF offers. Spoke with patient , he is still looking for a place to go. Prior to this admission he stayed with his brother for 2 weeks then in a hotel for a week.   Address in Epic is patient's address. He was staying with his brother because brothers home was bigger and easier to use walker. Then went to hotel because it was also easier to use walker than at his home.   Patient's brother's wife does not want patient to stay with them.   Expected Discharge Plan: Home/Self Care Barriers to Discharge: Continued Medical Work up  Expected Discharge Plan and Services Expected Discharge Plan: Home/Self Care In-house Referral: Financial Counselor Discharge Planning Services: CM Consult Post Acute Care Choice: NA Living arrangements for the past 2 months: Single Family Home                   DME Agency: NA       HH Arranged: NA           Social Determinants of Health (SDOH) Interventions    Readmission Risk Interventions Readmission Risk Prevention Plan 03/21/2020 03/20/2020 01/31/2020  Transportation Screening Complete Complete Complete  PCP or Specialist Appt within 5-7 Days - - Complete  PCP or Specialist Appt within 3-5 Days Complete - -  Home Care Screening - - Complete  Medication Review (RN CM) - - Complete  HRI or Home Care Consult Complete - -  Social Work Consult for [place] Planning/Counseling Complete - -  Palliative Care Screening Not Applicable - -  Medication Review ([place]) Referral to Pharmacy Referral to Pharmacy -  PCP or Specialist appointment within 3-5 days of discharge Patient refused Complete -  [place] or Home Care Consult Complete Complete -  SW Recovery Care/Counseling Consult  Complete Complete -  Palliative Care Screening Not Applicable Not Applicable -  [place] Not Applicable Not Applicable -  Some recent data might be hidden

## 2020-03-28 LAB — CBC WITH DIFFERENTIAL/PLATELET
Abs Immature Granulocytes: 0.01 10*3/uL (ref 0.00–0.07)
Basophils Absolute: 0.1 10*3/uL (ref 0.0–0.1)
Basophils Relative: 1 %
Eosinophils Absolute: 0.3 10*3/uL (ref 0.0–0.5)
Eosinophils Relative: 7 %
HCT: 28.3 % — ABNORMAL LOW (ref 39.0–52.0)
Hemoglobin: 8.8 g/dL — ABNORMAL LOW (ref 13.0–17.0)
Immature Granulocytes: 0 %
Lymphocytes Relative: 12 %
Lymphs Abs: 0.5 10*3/uL — ABNORMAL LOW (ref 0.7–4.0)
MCH: 24.4 pg — ABNORMAL LOW (ref 26.0–34.0)
MCHC: 31.1 g/dL (ref 30.0–36.0)
MCV: 78.4 fL — ABNORMAL LOW (ref 80.0–100.0)
Monocytes Absolute: 0.8 10*3/uL (ref 0.1–1.0)
Monocytes Relative: 18 %
Neutro Abs: 2.7 10*3/uL (ref 1.7–7.7)
Neutrophils Relative %: 62 %
Platelets: 254 10*3/uL (ref 150–400)
RBC: 3.61 MIL/uL — ABNORMAL LOW (ref 4.22–5.81)
RDW: 13.9 % (ref 11.5–15.5)
WBC: 4.3 10*3/uL (ref 4.0–10.5)
nRBC: 0 % (ref 0.0–0.2)

## 2020-03-28 LAB — BASIC METABOLIC PANEL
Anion gap: 7 (ref 5–15)
BUN: 10 mg/dL (ref 6–20)
CO2: 26 mmol/L (ref 22–32)
Calcium: 8.9 mg/dL (ref 8.9–10.3)
Chloride: 103 mmol/L (ref 98–111)
Creatinine, Ser: 0.74 mg/dL (ref 0.61–1.24)
GFR calc Af Amer: >60 mL/min (ref >60)
GFR calc non Af Amer: >60 mL/min (ref >60)
Glucose, Bld: 95 mg/dL (ref 70–99)
Potassium: 4 mmol/L (ref 3.5–5.1)
Sodium: 136 mmol/L (ref 135–145)

## 2020-03-28 LAB — PROTIME-INR
INR: 2.2 — ABNORMAL HIGH (ref 0.8–1.2)
Prothrombin Time: 23.3 seconds — ABNORMAL HIGH (ref 11.4–15.2)

## 2020-03-28 MED ORDER — GABAPENTIN 100 MG PO CAPS
200.0000 mg | ORAL_CAPSULE | Freq: Three times a day (TID) | ORAL | Status: DC
  Administered 2020-03-28 – 2020-04-04 (×21): 200 mg via ORAL
  Filled 2020-03-28 (×21): qty 2

## 2020-03-28 MED ORDER — WARFARIN SODIUM 5 MG PO TABS
5.0000 mg | ORAL_TABLET | Freq: Once | ORAL | Status: AC
  Administered 2020-03-28: 5 mg via ORAL
  Filled 2020-03-28: qty 1

## 2020-03-28 NOTE — Progress Notes (Signed)
Progress Note    Leonard Bentley  JSH:[id number] DOB: 02-26-1961  DOA: 03/18/2020 PCP: [name], NP    Brief Narrative:     Medical records reviewed and are as summarized below:  Leonard Bentley is an 59 y.o. male with medical history significant ofHTN,chronic diastolic CHF, severe MRs/pmitral valve repair, recurrentPE, anxiety,non-Hodgkin's lymphoma, osteomyelitis of the left foot, and alcohol abuse presents with complaints of 1 week of chest tightness and shortness of breath.  Patient is status post transmetatarsal amputation for osteomyelitis.  Currently patient is pending safe discharge plan.  Assessment/Plan:   Principal Problem:   Pulmonary edema Active Problems:   Alcohol abuse   Cigarette smoker   Personal history of pulmonary embolism   Neuropathy due to chemotherapeutic drug ([place])   Anxiety   S/P minimally-invasive mitral valve repair   Supratherapeutic INR   Subacute osteomyelitis, left ankle and foot (HCC)   Wound dehiscence, surgical, initial encounter   Microcytic hypochromic anemia   Chest pain    Pulmonary edema/Acute on chronic diastolic HF/History of mitral valve repair BNP WNL, Troponins negative Chest x-ray significant for mild interstitial edema Echo showed EF noted to be 50-55% Strict intake and output, daily weight S/P IV diuresis Follow-up with cardiology as an outpatient  Supratherapeutic INR,R eye conjunctival hemorrhage On admission patient noted to have INR greater than 10 S/p 2.5 g of vitamin K p.o. Daily monitoring of PT/INR (lovenox d/c'd as INR >2) Pharmacy to dose  Wound dehiscence of left foot,history of osteomyelitis s/p transmetatarsal amputation with application of wound VAC on 03/21/2020 by Dr. [name] Last fever spike of 100.4 on 03/22/2020, with no leukocytosis Patient has history of osteomyelitis of the left foot for which he just recently had a third ray amputation by Dr. [name] on 01/30/20 CRP WNL, ESR minimally  elevated  X-rays of the left foot: suspicious for osteomyelitis  -Status post transmetatarsal amputation on 5/14 -Message sent to Dr. [name] regarding follow-up as he stated patient should be seen in 1 week and currently it has been 1 week and patient still hospitalized  Alcohol abuse, with possible withdrawal Encouraged to continue alcohol cessation  Recurrent PEs on chronic anticoagulation D-dimer negative Patient with history of recurrent PEs on anticoagulation of Coumadin Coumadin per pharmacy  Neuropathy related with chemotherapy, history of Non-Hodgkin's lymphoma Patient with history of neuropathy thought to be related with chemotherapy drugs to treat non-Hodgkin's lymphoma Previously had been treated with systemic chemotherapy of CHOP and Rituxan last given in 11/2011 Continue gabapentin  Anxiety Continue Xanax as needed  Tobacco abuse Continue nicotine patch    Family Communication/Anticipated D/C date and plan/Code Status   DVT prophylaxis: Coumadin Code Status: Full Code.  Disposition Plan: Status is: Inpatient  Remains inpatient appropriate because:Unsafe d/c plan   Dispo: The patient is from: Home              Anticipated d/c is to: SNF/home?              Anticipated d/c date is: when safe d/c plan in place              Patient currently is medically stable to d/c.          Medical Consultants:    Orthopedics  Subjective:   Asking about placing weight on the left foot (patient instructed no weightbearing until approved by Dr. [name])  Objective:    Vitals:   03/27/20 1233 03/27/20 1714 03/27/20 2327 03/28/20 8502  BP: 106/89 117/88 113/82 113/81  Pulse: 85 85 93 78  Resp: _0 Temp: 99 F (37.2 C) 98.3 F (36.8 C) 98.5 F (36.9 C) 98.8 F (37.1 C)  TempSrc: Oral Oral Oral Oral  SpO2: 100% 100% 97% 97%  Weight:    87.5 kg  Height:        Intake/Output Summary (Last 24 hours) at 03/28/2020 0911 Last data filed at 03/28/2020  0093 Gross per 24 hour  Intake --  Output 801 ml  Net -801 ml   Filed Weights   03/24/20 0500 03/27/20 0552 03/28/20 0520  Weight: 86 kg 86.4 kg 87.5 kg    Exam:  General: Appearance:     Overweight male in no acute distress  Eyes:   Right conjunctival hemorrhage  Lungs:     Clear to auscultation bilaterally, respirations unlabored     MS:   Left transmetatarsal amputation noted. Wound vac in place  Neurologic:   Awake, alert, oriented x 3. No apparent focal neurological           defect.     Data Reviewed:   I have personally reviewed following labs and imaging studies:  Labs: Labs show the following:   Basic Metabolic Panel: Recent Labs  Lab 03/23/20 0704 03/23/20 0704 03/25/20 0326 03/25/20 0326 03/26/20 0244 03/26/20 0244 03/27/20 0558 03/28/20 0251  NA 135  --  135  --  137  --  136 136  K 4.0   < > 3.8   < > 4.2   < > 4.2 4.0  CL 98  --  100  --  100  --  101 103  CO2 25  --  26  --  28  --  27 26  GLUCOSE 109*  --  107*  --  132*  --  103* 95  BUN 12  --  11  --  12  --  10 10  CREATININE 0.86  --  0.80  --  0.83  --  0.75 0.74  CALCIUM 9.1  --  8.9  --  9.3  --  9.2 8.9   < > = values in this interval not displayed.   GFR Estimated Creatinine Clearance: 108.5 mL/min (by C-G formula based on SCr of 0.74 mg/dL). Liver Function Tests: No results for input(s): AST, ALT, ALKPHOS, BILITOT, PROT, ALBUMIN in the last 168 hours. No results for input(s): LIPASE, AMYLASE in the last 168 hours. No results for input(s): AMMONIA in the last 168 hours. Coagulation profile Recent Labs  Lab 03/24/20 0229 03/25/20 0326 03/26/20 0244 03/27/20 0558 03/28/20 0251  INR 1.9* 1.8* 1.8* 1.9* 2.2*    CBC: Recent Labs  Lab 03/24/20 0229 03/25/20 0326 03/26/20 0244 03/27/20 0558 03/28/20 0251  WBC 6.4 5.2 4.6 3.8* 4.3  NEUTROABS 4.5 3.2 2.7 2.3 2.7  HGB 9.6* 8.9* 8.9* 9.4* 8.8*  HCT 30.7* 27.5* 28.5* 30.3* 28.3*  MCV 79.9* 79.5* 79.2* 79.3* 78.4*  PLT 167  181 211 241 254   Cardiac Enzymes: No results for input(s): CKTOTAL, CKMB, CKMBINDEX, TROPONINI in the last 168 hours. BNP (last 3 results) No results for input(s): PROBNP in the last 8760 hours. CBG: No results for input(s): GLUCAP in the last 168 hours. D-Dimer: No results for input(s): DDIMER in the last 72 hours. Hgb A1c: No results for input(s): HGBA1C in the last 72 hours. Lipid Profile: No results for input(s): CHOL, HDL, LDLCALC, TRIG, CHOLHDL, LDLDIRECT in the last 72 hours.  Thyroid function studies: No results for input(s): TSH, T4TOTAL, T3FREE, THYROIDAB in the last 72 hours.  Invalid input(s): FREET3 Anemia work up: No results for input(s): VITAMINB12, FOLATE, FERRITIN, TIBC, IRON, RETICCTPCT in the last 72 hours. Sepsis Labs: Recent Labs  Lab 03/25/20 0326 03/26/20 0244 03/27/20 0558 03/28/20 0251  WBC 5.2 4.6 3.8* 4.3    Microbiology Recent Results (from the past 240 hour(s))  Surgical pcr screen     Status: Abnormal   Collection Time: 03/20/20  6:54 PM   Specimen: Nasal Mucosa; Nasal Swab  Result Value Ref Range Status   MRSA, PCR POSITIVE (A) NEGATIVE Final    Comment: RESULT CALLED TO, READ BACK BY AND VERIFIED WITH: K.GOAD,RN AT 2044 12/22/19 BY L.PITT    Staphylococcus aureus POSITIVE (A) NEGATIVE Final    Comment: (NOTE) The Xpert SA Assay (FDA approved for NASAL specimens in patients 74 years of age and older), is one component of a comprehensive surveillance program. It is not intended to diagnose infection nor to guide or monitor treatment.     Procedures and diagnostic studies:  No results found.  Medications:   . docusate sodium  100 mg Oral BID  . enoxaparin (LOVENOX) injection  1 mg/kg Subcutaneous Q12H  . folic acid  1 mg Oral Daily  . gabapentin  100 mg Oral TID  . metoprolol succinate  50 mg Oral Daily  . multivitamin with minerals  1 tablet Oral Daily  . mupirocin ointment   Nasal BID  . nicotine  21 mg Transdermal Daily  .  sodium chloride flush  3 mL Intravenous Once  . sodium chloride flush  3 mL Intravenous Q12H  . thiamine  100 mg Oral Daily   Or  . thiamine  100 mg Intravenous Daily  . Warfarin - Pharmacist Dosing Inpatient   Does not apply q1600   Continuous Infusions: . sodium chloride       LOS: 9 days   [name]  [facility]   How to contact the [facility] Attending or Consulting provider [place] or covering provider during after hours [place], for this patient?  1. Check the care team in [facility] and look for a) attending/consulting TR[facility] provider listed and b) the [facility] team listed 2. Log into [email or web address] and use [place]'s universal password to access. If you do not have the password, please contact the hospital operator. 3. Locate the [facility] provider you are looking for under [facility] and page to a number that you can be directly reached. 4. If you still have difficulty reaching the provider, please page the [facility] (Director on Call) for the Hospitalists listed on amion for assistance.  03/28/2020, 9:11 AM

## 2020-03-28 NOTE — Progress Notes (Signed)
Physical Therapy Treatment Patient Details Name: Leonard Bentley MRN: [id number] DOB: 23-Apr-1961 Today's Date: 03/28/2020    History of Present Illness Pt is a 59 y/o male admitted secondary to chest tightness. Pt found to have pulmonary edema with history of diastolic congestive heart.  Underwent Transmetatarsal amputation left foot with application Prevena wound VAC on 5/14; PMH including but not limited to HTN, chronic diastolic CHF, severe MR s/p mitral valve repair, recurrent PE, anxiety, non-Hodgkin's lymphoma, osteomyelitis of the left foot, and alcohol abuse.    PT Comments    Pt states he plans to discharge to a hotel on Monday. Pt politely declining out of bed mobility today due to phantom pain and recently ambulating limited hallway distances with RN.  Education provided regarding desensitization techniques and pt practiced, DME recommendations, and exercise program for LLE open chain and RLE closed chain strengthening. Written HEP provided. Pt asking when his wound vac would be coming off and PT directed him to ask MD. Pt with no further questions at this time.    Follow Up Recommendations  Home health PT;SNF     Equipment Recommendations  Wheelchair (measurements PT);Wheelchair cushion (measurements PT)(shower seat for stair negotiation)    Recommendations for Other Services       Precautions / Restrictions Precautions Precautions: Fall Precaution Comments: wound vac Restrictions Weight Bearing Restrictions: Yes LLE Weight Bearing: Non weight bearing    Mobility  Bed Mobility               General bed mobility comments: deferred by pt  Transfers                 General transfer comment: deferred by pt  Ambulation/Gait             General Gait Details: deferred by pt   Stairs             Wheelchair Mobility    Modified Rankin (Stroke Patients Only)       Balance                                             Cognition Arousal/Alertness: Awake/alert Behavior During Therapy: WFL for tasks assessed/performed Overall Cognitive Status: Within Functional Limits for tasks assessed                                        Exercises      General Comments        Pertinent Vitals/Pain Pain Assessment: Faces Faces Pain Scale: Hurts even more Pain Location: phantom pain Pain Descriptors / Indicators: Burning Pain Intervention(s): Limited activity within patient's tolerance;Monitored during session    Home Living                      Prior Function            PT Goals (current goals can now be found in the care plan section) Acute Rehab PT Goals Patient Stated Goal: for his foot to heal Potential to Achieve Goals: Good    Frequency    Min 3X/week      PT Plan Current plan remains appropriate    Co-evaluation              AM-PAC PT "6 Clicks" Mobility  Outcome Measure  Help needed turning from your back to your side while in a flat bed without using bedrails?: A Little Help needed moving from lying on your back to sitting on the side of a flat bed without using bedrails?: A Little Help needed moving to and from a bed to a chair (including a wheelchair)?: A Little Help needed standing up from a chair using your arms (e.g., wheelchair or bedside chair)?: A Little Help needed to walk in hospital room?: A Little Help needed climbing 3-5 steps with a railing? : A Little 6 Click Score: 18    End of Session   Activity Tolerance: Patient limited by pain Patient left: in bed;with call bell/phone within reach Nurse Communication: Mobility status PT Visit Diagnosis: Other abnormalities of gait and mobility (R26.89)     Time: [id number] PT Time Calculation (min) (ACUTE ONLY): 12 min  Charges:  $Self Care/Home Management: 8-22                       Wyona Almas, PT, DPT [place] Pager [phone] Office  [phone]    Deno Etienne 03/28/2020, 5:00 PM

## 2020-03-28 NOTE — Progress Notes (Signed)
ANTICOAGULATION CONSULT NOTE - Follow Up Consult  Pharmacy Consult for Coumadin   (lovenox discontinued 03/28/20) Indication: h/o VTE  Allergies  Allergen Reactions  . Other Other (See Comments)    "Cactus" caused blisters on tongue (if prepared as food)    Patient Measurements: Height: 5' 9.5" (176.5 cm) Weight: 87.5 kg (192 lb 14.4 oz) IBW/kg (Calculated) : 71.85   Vital Signs: Temp: 98.8 F (37.1 C) (05/21 0520) Temp Source: Oral (05/21 0520) BP: 113/81 (05/21 0520) Pulse Rate: 78 (05/21 0520)  Labs: Recent Labs    03/26/20 0244 03/26/20 0244 03/27/20 0558 03/28/20 0251  HGB 8.9*   < > 9.4* 8.8*  HCT 28.5*  --  30.3* 28.3*  PLT 211  --  241 254  LABPROT 20.0*  --  21.1* 23.3*  INR 1.8*  --  1.9* 2.2*  CREATININE 0.83  --  0.75 0.74   < > = values in this interval not displayed.    Estimated Creatinine Clearance: 108.5 mL/min (by C-G formula based on SCr of 0.74 mg/dL).  Assessment:  59 y/o male with history of  VTE (recurrent PE), MV repair on warfarin PTA with a goal INR of 2-3. Also, patient went into Afib on 5/12 PM.  INR 2.2 therapeutic today, trend last 3 days is  1.8>1.9>2.2, s/p Vit K 2.5mg  po on 5/11 for INR >10 on admit. warf resumed 5/13  Hgb low stable 9.4>8.8  pltc wnl stable, no bleeding noted  - PTA Coumadin 7.5mg  daily with admit INR>10  Goal of Therapy:  INR 2-3 Monitor platelets by anticoagulation protocol: Yes   Plan:  Warfarin decrease to  5 mg PO x1 today Lovenox discontinued as INR is >2 Daily INR, monitor for s/sx of bleeding  Nicole Cella, RPh Clinical Pharmacist [phone] Please check AMION for all [place] phone numbers After 10:00 PM, call [place] [phone] 03/28/2020 9:33 AM

## 2020-03-28 NOTE — Plan of Care (Signed)
  Problem: Nutrition: Goal: Adequate nutrition will be maintained Outcome: Progressing   Problem: Elimination: Goal: Will not experience complications related to bowel motility Outcome: Progressing   Problem: Pain Managment: Goal: General experience of comfort will improve Outcome: Progressing   

## 2020-03-29 LAB — BASIC METABOLIC PANEL
Anion gap: 8 (ref 5–15)
BUN: 13 mg/dL (ref 6–20)
CO2: 26 mmol/L (ref 22–32)
Calcium: 9.1 mg/dL (ref 8.9–10.3)
Chloride: 101 mmol/L (ref 98–111)
Creatinine, Ser: 0.78 mg/dL (ref 0.61–1.24)
GFR calc Af Amer: >60 mL/min (ref >60)
GFR calc non Af Amer: >60 mL/min (ref >60)
Glucose, Bld: 136 mg/dL — ABNORMAL HIGH (ref 70–99)
Potassium: 4 mmol/L (ref 3.5–5.1)
Sodium: 135 mmol/L (ref 135–145)

## 2020-03-29 LAB — PROTIME-INR
INR: 2.1 — ABNORMAL HIGH (ref 0.8–1.2)
Prothrombin Time: 22.5 seconds — ABNORMAL HIGH (ref 11.4–15.2)

## 2020-03-29 LAB — CBC WITH DIFFERENTIAL/PLATELET
Abs Immature Granulocytes: 0.01 10*3/uL (ref 0.00–0.07)
Basophils Absolute: 0 10*3/uL (ref 0.0–0.1)
Basophils Relative: 1 %
Eosinophils Absolute: 0.3 10*3/uL (ref 0.0–0.5)
Eosinophils Relative: 6 %
HCT: 27.7 % — ABNORMAL LOW (ref 39.0–52.0)
Hemoglobin: 8.6 g/dL — ABNORMAL LOW (ref 13.0–17.0)
Immature Granulocytes: 0 %
Lymphocytes Relative: 11 %
Lymphs Abs: 0.5 10*3/uL — ABNORMAL LOW (ref 0.7–4.0)
MCH: 24.4 pg — ABNORMAL LOW (ref 26.0–34.0)
MCHC: 31 g/dL (ref 30.0–36.0)
MCV: 78.7 fL — ABNORMAL LOW (ref 80.0–100.0)
Monocytes Absolute: 0.7 10*3/uL (ref 0.1–1.0)
Monocytes Relative: 16 %
Neutro Abs: 3 10*3/uL (ref 1.7–7.7)
Neutrophils Relative %: 66 %
Platelets: 290 10*3/uL (ref 150–400)
RBC: 3.52 MIL/uL — ABNORMAL LOW (ref 4.22–5.81)
RDW: 13.9 % (ref 11.5–15.5)
WBC: 4.5 10*3/uL (ref 4.0–10.5)
nRBC: 0 % (ref 0.0–0.2)

## 2020-03-29 MED ORDER — WARFARIN SODIUM 7.5 MG PO TABS
7.5000 mg | ORAL_TABLET | Freq: Once | ORAL | Status: AC
  Administered 2020-03-29: 7.5 mg via ORAL
  Filled 2020-03-29: qty 1

## 2020-03-29 MED ORDER — METOPROLOL SUCCINATE ER 25 MG PO TB24
12.5000 mg | ORAL_TABLET | Freq: Every day | ORAL | Status: DC
  Administered 2020-03-29 – 2020-04-04 (×7): 12.5 mg via ORAL
  Filled 2020-03-29 (×7): qty 1

## 2020-03-29 NOTE — Progress Notes (Addendum)
   03/29/20 0346  Vitals  Temp 97.9 F (36.6 C)  Temp Source Oral  BP 104/77  MAP (mmHg) 87  BP Location Right Arm  BP Method Automatic  Patient Position (if appropriate) Lying  Pulse Rate 70  Resp 18  Oxygen Therapy  SpO2 99 %  O2 Device Room Air  MEWS Score  MEWS Temp 0  MEWS Systolic 0  MEWS Pulse 0  MEWS RR 0  MEWS LOC 0  MEWS Score 0  MEWS Score Color Green  Notified by CCMD patient experienced  a missed beat of 1.43 seconds and also tele rythmn changed from NSR to 2nd degree  AVB. On call provider made aware.

## 2020-03-29 NOTE — Progress Notes (Signed)
ANTICOAGULATION CONSULT NOTE - Follow Up Consult  Pharmacy Consult for Coumadin Indication: h/o VTE  Allergies  Allergen Reactions  . Other Other (See Comments)    "Cactus" caused blisters on tongue (if prepared as food)    Patient Measurements: Height: 5' 9.5" (176.5 cm) Weight: 87.3 kg (192 lb 7.4 oz) IBW/kg (Calculated) : 71.85   Vital Signs: Temp: 98.1 F (36.7 C) (05/22 0443) Temp Source: Oral (05/22 0443) BP: 106/75 (05/22 0907) Pulse Rate: 78 (05/22 0907)  Labs: Recent Labs    03/27/20 0558 03/27/20 0558 03/28/20 0251 03/29/20 0239  HGB 9.4*   < > 8.8* 8.6*  HCT 30.3*  --  28.3* 27.7*  PLT 241  --  254 290  LABPROT 21.1*  --  23.3* 22.5*  INR 1.9*  --  2.2* 2.1*  CREATININE 0.75  --  0.74 0.78   < > = values in this interval not displayed.    Estimated Creatinine Clearance: 108.5 mL/min (by C-G formula based on SCr of 0.78 mg/dL).  Assessment:  59 y/o male with history of  VTE (recurrent PE), MV repair on warfarin 7.5 mg daily PTA with a goal INR of 2-3.  Patient received VitK 2.5 mg PO on 5/11 for INR >10 on admission. On 5/12 PM, patient went into Afib. Warfarin was resumed on 5/13.   5/22 AM update: INR 1.9 slightly sub-therapeutic, Hbg 8.6 (stable), plt wnl, no bleeding noted.    Goal of Therapy:  INR 2-3 Monitor platelets by anticoagulation protocol: Yes   Plan:  Warfarin decrease to  7.5 mg PO x1 today Daily INR, monitor for s/sx of bleeding  [name], PharmD  PGY1 [place] Resident  Please check AMION for all [place] phone numbers After 10:00 PM, call [place] [phone] 03/29/2020 9:45 AM

## 2020-03-29 NOTE — Progress Notes (Signed)
Progress Note    Leonard Bentley  EHU:[id number] DOB: Mar 07, 1961  DOA: 03/18/2020 PCP: [name], NP    Brief Narrative:     Medical records reviewed and are as summarized below:  Leonard Bentley is an 59 y.o. male with medical history significant ofHTN,chronic diastolic CHF, severe MRs/pmitral valve repair, recurrentPE, anxiety,non-Hodgkin's lymphoma, osteomyelitis of the left foot, and alcohol abuse presents with complaints of 1 week of chest tightness and shortness of breath.  Patient is status post transmetatarsal amputation for osteomyelitis.  Currently patient is pending safe discharge plan.  Assessment/Plan:   Principal Problem:   Pulmonary edema Active Problems:   Alcohol abuse   Cigarette smoker   Personal history of pulmonary embolism   Neuropathy due to chemotherapeutic drug ([place])   Anxiety   S/P minimally-invasive mitral valve repair   Supratherapeutic INR   Subacute osteomyelitis, left ankle and foot (HCC)   Wound dehiscence, surgical, initial encounter   Microcytic hypochromic anemia   Chest pain    Pulmonary edema/Acute on chronic diastolic HF/History of mitral valve repair BNP WNL, Troponins negative Chest x-ray significant for mild interstitial edema Echo showed EF noted to be 50-55% Strict intake and output, daily weight S/P IV diuresis Follow-up with cardiology as an outpatient  Supratherapeutic INR,R eye conjunctival hemorrhage On admission patient noted to have INR greater than 10 S/p 2.5 g of vitamin K p.o. Daily monitoring of PT/INR (lovenox d/c'd as INR >2) Pharmacy to dose  Wound dehiscence of left foot,history of osteomyelitis s/p transmetatarsal amputation with application of wound VAC on 03/21/2020 by Dr. [name] Last fever spike of 100.4 on 03/22/2020, with no leukocytosis Patient has history of osteomyelitis of the left foot for which he just recently had a third ray amputation by Dr. [name] on 01/30/20 CRP WNL, ESR minimally  elevated  X-rays of the left foot: suspicious for osteomyelitis  -Status post transmetatarsal amputation on 5/14 -Dr. [name] plans to see patient on Monday  Alcohol abuse, with possible withdrawal Encouraged to continue alcohol cessation  Recurrent PEs on chronic anticoagulation D-dimer negative Patient with history of recurrent PEs on anticoagulation of Coumadin Coumadin per pharmacy  Neuropathy related with chemotherapy, history of Non-Hodgkin's lymphoma Patient with history of neuropathy thought to be related with chemotherapy drugs to treat non-Hodgkin's lymphoma Previously had been treated with systemic chemotherapy of CHOP and Rituxan last given in 11/2011 Continue gabapentin  Anxiety Continue Xanax as needed  Tobacco abuse Continue nicotine patch    Family Communication/Anticipated D/C date and plan/Code Status   DVT prophylaxis: Coumadin Code Status: Full Code.  Disposition Plan: Status is: Inpatient  Remains inpatient appropriate because:Unsafe d/c plan   Dispo: The patient is from: Home              Anticipated d/c is to: SNF/home? (homeless)              Anticipated d/c date is: when safe d/c plan in place              Patient currently is medically stable to d/c.          Medical Consultants:    Orthopedics  Subjective:   Asking about placing weight on the left foot (patient instructed no weightbearing until approved by Dr. [name])  Objective:    Vitals:   03/29/20 0030 03/29/20 0346 03/29/20 0443 03/29/20 0907  BP: 112/76 104/77 101/79 106/75  Pulse: 72 70 88 78  Resp: _0 Temp:  98.4 F (36.9 C) 97.9 F (36.6 C) 98.1 F (36.7 C)   TempSrc: Oral Oral Oral   SpO2: 100% 99% 99%   Weight:   87.3 kg   Height:        Intake/Output Summary (Last 24 hours) at 03/29/2020 0940 Last data filed at 03/29/2020 0400 Gross per 24 hour  Intake 240 ml  Output 0 ml  Net 240 ml   Filed Weights   03/27/20 0552 03/28/20 0520 03/29/20 0443   Weight: 86.4 kg 87.5 kg 87.3 kg    Exam:  General: Appearance:     Overweight male in no acute distress  Eyes:    PERRL, conjunctiva/corneas clear, EOM's intact       Lungs:     respirations unlabored     MS:   transmetatarsal amputation noted- wound vac in place  Neurologic:   Awake, alert, oriented x 3. No apparent focal neurological           defect.      Data Reviewed:   I have personally reviewed following labs and imaging studies:  Labs: Labs show the following:   Basic Metabolic Panel: Recent Labs  Lab 03/25/20 0326 03/25/20 0326 03/26/20 0244 03/26/20 0244 03/27/20 0558 03/27/20 0558 03/28/20 0251 03/29/20 0239  NA 135  --  137  --  136  --  136 135  K 3.8   < > 4.2   < > 4.2   < > 4.0 4.0  CL 100  --  100  --  101  --  103 101  CO2 26  --  28  --  27  --  26 26  GLUCOSE 107*  --  132*  --  103*  --  95 136*  BUN 11  --  12  --  10  --  10 13  CREATININE 0.80  --  0.83  --  0.75  --  0.74 0.78  CALCIUM 8.9  --  9.3  --  9.2  --  8.9 9.1   < > = values in this interval not displayed.   GFR Estimated Creatinine Clearance: 108.5 mL/min (by C-G formula based on SCr of 0.78 mg/dL). Liver Function Tests: No results for input(s): AST, ALT, ALKPHOS, BILITOT, PROT, ALBUMIN in the last 168 hours. No results for input(s): LIPASE, AMYLASE in the last 168 hours. No results for input(s): AMMONIA in the last 168 hours. Coagulation profile Recent Labs  Lab 03/25/20 0326 03/26/20 0244 03/27/20 0558 03/28/20 0251 03/29/20 0239  INR 1.8* 1.8* 1.9* 2.2* 2.1*    CBC: Recent Labs  Lab 03/25/20 0326 03/26/20 0244 03/27/20 0558 03/28/20 0251 03/29/20 0239  WBC 5.2 4.6 3.8* 4.3 4.5  NEUTROABS 3.2 2.7 2.3 2.7 3.0  HGB 8.9* 8.9* 9.4* 8.8* 8.6*  HCT 27.5* 28.5* 30.3* 28.3* 27.7*  MCV 79.5* 79.2* 79.3* 78.4* 78.7*  PLT 181 211 241 254 290   Cardiac Enzymes: No results for input(s): CKTOTAL, CKMB, CKMBINDEX, TROPONINI in the last 168 hours. BNP (last 3  results) No results for input(s): PROBNP in the last 8760 hours. CBG: No results for input(s): GLUCAP in the last 168 hours. D-Dimer: No results for input(s): DDIMER in the last 72 hours. Hgb A1c: No results for input(s): HGBA1C in the last 72 hours. Lipid Profile: No results for input(s): CHOL, HDL, LDLCALC, TRIG, CHOLHDL, LDLDIRECT in the last 72 hours. Thyroid function studies: No results for input(s): TSH, T4TOTAL, T3FREE, THYROIDAB in the last 72 hours.  Invalid input(s): FREET3 Anemia work up: No results for input(s): VITAMINB12, FOLATE, FERRITIN, TIBC, IRON, RETICCTPCT in the last 72 hours. Sepsis Labs: Recent Labs  Lab 03/26/20 0244 03/27/20 0558 03/28/20 0251 03/29/20 0239  WBC 4.6 3.8* 4.3 4.5    Microbiology Recent Results (from the past 240 hour(s))  Surgical pcr screen     Status: Abnormal   Collection Time: 03/20/20  6:54 PM   Specimen: Nasal Mucosa; Nasal Swab  Result Value Ref Range Status   MRSA, PCR POSITIVE (A) NEGATIVE Final    Comment: RESULT CALLED TO, READ BACK BY AND VERIFIED WITH: K.GOAD,RN AT 2044 12/22/19 BY L.PITT    Staphylococcus aureus POSITIVE (A) NEGATIVE Final    Comment: (NOTE) The Xpert SA Assay (FDA approved for NASAL specimens in patients 34 years of age and older), is one component of a comprehensive surveillance program. It is not intended to diagnose infection nor to guide or monitor treatment.     Procedures and diagnostic studies:  No results found.  Medications:   . docusate sodium  100 mg Oral BID  . folic acid  1 mg Oral Daily  . gabapentin  200 mg Oral TID  . metoprolol succinate  12.5 mg Oral Daily  . multivitamin with minerals  1 tablet Oral Daily  . mupirocin ointment   Nasal BID  . nicotine  21 mg Transdermal Daily  . sodium chloride flush  3 mL Intravenous Once  . sodium chloride flush  3 mL Intravenous Q12H  . thiamine  100 mg Oral Daily   Or  . thiamine  100 mg Intravenous Daily  . Warfarin -  Pharmacist Dosing Inpatient   Does not apply q1600   Continuous Infusions: . sodium chloride       LOS: 10 days   [name]  [facility]   How to contact the [facility] Attending or Consulting provider [place] or covering provider during after hours [place], for this patient?  1. Check the care team in [facility] and look for a) attending/consulting TRH provider listed and b) the [facility] team listed 2. Log into [email or web address] and use [place]'s universal password to access. If you do not have the password, please contact the hospital operator. 3. Locate the [facility] provider you are looking for under [facility] and page to a number that you can be directly reached. 4. If you still have difficulty reaching the provider, please page the [facility] (Director on Call) for the Hospitalists listed on amion for assistance.  03/29/2020, 9:40 AM

## 2020-03-29 NOTE — Progress Notes (Signed)
RN and MD spoke to pt regarding high fall risk with equipment  Pt refused bed alarm Bed in lowest position, non skid socks on, fall risk arm band on, call light and all belongings in reach  Will continue to monitor

## 2020-03-30 LAB — PROTIME-INR
INR: 2.1 — ABNORMAL HIGH (ref 0.8–1.2)
Prothrombin Time: 22.9 seconds — ABNORMAL HIGH (ref 11.4–15.2)

## 2020-03-30 MED ORDER — WARFARIN SODIUM 7.5 MG PO TABS
7.5000 mg | ORAL_TABLET | Freq: Once | ORAL | Status: AC
  Administered 2020-03-30: 7.5 mg via ORAL
  Filled 2020-03-30: qty 1

## 2020-03-30 NOTE — Care Plan (Signed)
Pt. Alert and aware sitting up in bed. He shares some of his recent history. He expresses some anxiety of possible surgery and what the future holds for him. I explored some of these issues with him.  I offered caring and supportive presence, prayers and blessings.  Pt requested further follow-up.

## 2020-03-30 NOTE — Progress Notes (Signed)
ANTICOAGULATION CONSULT NOTE - Follow Up Consult  Pharmacy Consult for Coumadin Indication: h/o VTE  Allergies  Allergen Reactions  . Other Other (See Comments)    "Cactus" caused blisters on tongue (if prepared as food)    Patient Measurements: Height: 5' 9.5" (176.5 cm) Weight: 87.6 kg (193 lb 2 oz) IBW/kg (Calculated) : 71.85   Vital Signs: Temp: 98 F (36.7 C) (05/23 0531) Temp Source: Oral (05/23 0531) BP: 97/73 (05/23 0531) Pulse Rate: 87 (05/23 0531)  Labs: Recent Labs    03/28/20 0251 03/29/20 0239 03/30/20 0501  HGB 8.8* 8.6*  --   HCT 28.3* 27.7*  --   PLT 254 290  --   LABPROT 23.3* 22.5* 22.9*  INR 2.2* 2.1* 2.1*  CREATININE 0.74 0.78  --     Estimated Creatinine Clearance: 108.6 mL/min (by C-G formula based on SCr of 0.78 mg/dL).  Assessment:  59 y/o male with history of  VTE (recurrent PE), MV repair on warfarin 7.5 mg daily PTA with a goal INR of 2-3.  Patient received VitK 2.5 mg PO on 5/11 for INR >10 on admission. On 5/12 PM, patient went into Afib. Warfarin was resumed on 5/13.   5/22 AM update: INR 2.1 therapeutic, Hbg 8.6 (stable), plt wnl, no bleeding noted.    Goal of Therapy:  INR 2-3 Monitor platelets by anticoagulation protocol: Yes   Plan:  Warfarin 7.5 mg PO x1 today Daily INR, monitor for s/sx of bleeding  Leonard Bentley, PharmD  PGY1 Leonard Bentley Resident  Please check AMION for all [place] phone numbers After 10:00 PM, call [place] [phone] 03/30/2020 8:50 AM

## 2020-03-30 NOTE — Progress Notes (Signed)
Progress Note    Leonard Bentley  JJO:[id number] DOB: 04-10-1961  DOA: 03/18/2020 PCP: [name], NP    Brief Narrative:     Medical records reviewed and are as summarized below:  Leonard Bentley is an 59 y.o. male with medical history significant ofHTN,chronic diastolic CHF, severe MRs/pmitral valve repair, recurrentPE, anxiety,non-Hodgkin's lymphoma, osteomyelitis of the left foot, and alcohol abuse presents with complaints of 1 week of chest tightness and shortness of breath.  Patient is status post transmetatarsal amputation for osteomyelitis.  Currently patient is pending safe discharge plan.  Assessment/Plan:   Principal Problem:   Pulmonary edema Active Problems:   Alcohol abuse   Cigarette smoker   Personal history of pulmonary embolism   Neuropathy due to chemotherapeutic drug ([place])   Anxiety   S/P minimally-invasive mitral valve repair   Supratherapeutic INR   Subacute osteomyelitis, left ankle and foot (HCC)   Wound dehiscence, surgical, initial encounter   Microcytic hypochromic anemia   Chest pain    Pulmonary edema/Acute on chronic diastolic HF/History of mitral valve repair BNP WNL, Troponins negative Chest x-ray significant for mild interstitial edema Echo showed EF noted to be 50-55% Strict intake and output, daily weight S/P IV diuresis Follow-up with cardiology as an outpatient  Supratherapeutic INR,R eye conjunctival hemorrhage On admission patient noted to have INR greater than 10 S/p 2.5 g of vitamin K p.o. Daily monitoring of PT/INR (lovenox d/c'd as INR >2) Pharmacy to dose  Wound dehiscence of left foot,history of osteomyelitis s/p transmetatarsal amputation with application of wound VAC on 03/21/2020 by Dr. [name] Last fever spike of 100.4 on 03/22/2020, with no leukocytosis Patient has history of osteomyelitis of the left foot for which he just recently had a third ray amputation by Dr. [name] on 01/30/20 CRP WNL, ESR minimally  elevated  X-rays of the left foot: suspicious for osteomyelitis  -Status post transmetatarsal amputation on 5/14 -Dr. [name] plans to see patient on Monday  Alcohol abuse, with possible withdrawal Encouraged to continue alcohol cessation  Recurrent PEs on chronic anticoagulation D-dimer negative Patient with history of recurrent PEs on anticoagulation of Coumadin Coumadin per pharmacy  Neuropathy related with chemotherapy, history of Non-Hodgkin's lymphoma Patient with history of neuropathy thought to be related with chemotherapy drugs to treat non-Hodgkin's lymphoma Previously had been treated with systemic chemotherapy of CHOP and Rituxan last given in 11/2011 Continue gabapentin  Anxiety Continue Xanax as needed  Tobacco abuse Continue nicotine patch  Anemia -? ABLA -transfuse for < 7 or symptoms -recheck Fe in AM   Family Communication/Anticipated D/C date and plan/Code Status   DVT prophylaxis: Coumadin Code Status: Full Code.  Disposition Plan: Status is: Inpatient  Remains inpatient appropriate because:Unsafe d/c plan   Dispo: The patient is from: Home              Anticipated d/c is to: SNF/home? (homeless)              Anticipated d/c date is: when safe d/c plan in place              Patient currently is medically stable to d/c.          Medical Consultants:    Orthopedics  Subjective:   Plans to get up more as he is feeling weaker  Objective:    Vitals:   03/30/20 0116 03/30/20 0531 03/30/20 0852 03/30/20 1156  BP: 100/84 97/73 105/76 103/75  Pulse: 85 87 93 81  Resp:  _0 Temp:  98 F (36.7 C)  98 F (36.7 C)  TempSrc:  Oral    SpO2:  99%  100%  Weight:  87.6 kg    Height:        Intake/Output Summary (Last 24 hours) at 03/30/2020 1250 Last data filed at 03/30/2020 0857 Gross per 24 hour  Intake 960 ml  Output 600 ml  Net 360 ml   Filed Weights   03/28/20 0520 03/29/20 0443 03/30/20 0531  Weight: 87.5 kg 87.3 kg  87.6 kg    Exam:  In bed, NAD  Data Reviewed:   I have personally reviewed following labs and imaging studies:  Labs: Labs show the following:   Basic Metabolic Panel: Recent Labs  Lab 03/25/20 0326 03/25/20 0326 03/26/20 0244 03/26/20 0244 03/27/20 0558 03/27/20 0558 03/28/20 0251 03/29/20 0239  NA 135  --  137  --  136  --  136 135  K 3.8   < > 4.2   < > 4.2   < > 4.0 4.0  CL 100  --  100  --  101  --  103 101  CO2 26  --  28  --  27  --  26 26  GLUCOSE 107*  --  132*  --  103*  --  95 136*  BUN 11  --  12  --  10  --  10 13  CREATININE 0.80  --  0.83  --  0.75  --  0.74 0.78  CALCIUM 8.9  --  9.3  --  9.2  --  8.9 9.1   < > = values in this interval not displayed.   GFR Estimated Creatinine Clearance: 108.6 mL/min (by C-G formula based on SCr of 0.78 mg/dL). Liver Function Tests: No results for input(s): AST, ALT, ALKPHOS, BILITOT, PROT, ALBUMIN in the last 168 hours. No results for input(s): LIPASE, AMYLASE in the last 168 hours. No results for input(s): AMMONIA in the last 168 hours. Coagulation profile Recent Labs  Lab 03/26/20 0244 03/27/20 0558 03/28/20 0251 03/29/20 0239 03/30/20 0501  INR 1.8* 1.9* 2.2* 2.1* 2.1*    CBC: Recent Labs  Lab 03/25/20 0326 03/26/20 0244 03/27/20 0558 03/28/20 0251 03/29/20 0239  WBC 5.2 4.6 3.8* 4.3 4.5  NEUTROABS 3.2 2.7 2.3 2.7 3.0  HGB 8.9* 8.9* 9.4* 8.8* 8.6*  HCT 27.5* 28.5* 30.3* 28.3* 27.7*  MCV 79.5* 79.2* 79.3* 78.4* 78.7*  PLT 181 211 241 254 290   Cardiac Enzymes: No results for input(s): CKTOTAL, CKMB, CKMBINDEX, TROPONINI in the last 168 hours. BNP (last 3 results) No results for input(s): PROBNP in the last 8760 hours. CBG: No results for input(s): GLUCAP in the last 168 hours. D-Dimer: No results for input(s): DDIMER in the last 72 hours. Hgb A1c: No results for input(s): HGBA1C in the last 72 hours. Lipid Profile: No results for input(s): CHOL, HDL, LDLCALC, TRIG, CHOLHDL, LDLDIRECT in  the last 72 hours. Thyroid function studies: No results for input(s): TSH, T4TOTAL, T3FREE, THYROIDAB in the last 72 hours.  Invalid input(s): FREET3 Anemia work up: No results for input(s): VITAMINB12, FOLATE, FERRITIN, TIBC, IRON, RETICCTPCT in the last 72 hours. Sepsis Labs: Recent Labs  Lab 03/26/20 0244 03/27/20 0558 03/28/20 0251 03/29/20 0239  WBC 4.6 3.8* 4.3 4.5    Microbiology Recent Results (from the past 240 hour(s))  Surgical pcr screen     Status: Abnormal   Collection Time: 03/20/20  6:54 PM  Specimen: Nasal Mucosa; Nasal Swab  Result Value Ref Range Status   MRSA, PCR POSITIVE (A) NEGATIVE Final    Comment: RESULT CALLED TO, READ BACK BY AND VERIFIED WITH: K.GOAD,RN AT 2044 12/22/19 BY L.PITT    Staphylococcus aureus POSITIVE (A) NEGATIVE Final    Comment: (NOTE) The Xpert SA Assay (FDA approved for NASAL specimens in patients 60 years of age and older), is one component of a comprehensive surveillance program. It is not intended to diagnose infection nor to guide or monitor treatment.     Procedures and diagnostic studies:  No results found.  Medications:   . docusate sodium  100 mg Oral BID  . folic acid  1 mg Oral Daily  . gabapentin  200 mg Oral TID  . metoprolol succinate  12.5 mg Oral Daily  . multivitamin with minerals  1 tablet Oral Daily  . mupirocin ointment   Nasal BID  . nicotine  21 mg Transdermal Daily  . sodium chloride flush  3 mL Intravenous Once  . sodium chloride flush  3 mL Intravenous Q12H  . thiamine  100 mg Oral Daily   Or  . thiamine  100 mg Intravenous Daily  . warfarin  7.5 mg Oral ONCE-1600  . Warfarin - Pharmacist Dosing Inpatient   Does not apply q1600   Continuous Infusions: . sodium chloride       LOS: 11 days   [name]  [facility]   How to contact the [facility] Attending or Consulting provider [place] or covering provider during after hours [place], for this patient?  1. Check the care team in  [facility] and look for a) attending/consulting TRH provider listed and b) the [facility] team listed 2. Log into [email or web address] and use [place]'s universal password to access. If you do not have the password, please contact the hospital operator. 3. Locate the [facility] provider you are looking for under [facility] and page to a number that you can be directly reached. 4. If you still have difficulty reaching the provider, please page the [facility] (Director on Call) for the Hospitalists listed on amion for assistance.  03/30/2020, 12:50 PM

## 2020-03-31 ENCOUNTER — Telehealth: Payer: Self-pay | Admitting: Physician Assistant

## 2020-03-31 LAB — PROTIME-INR
INR: 2.1 — ABNORMAL HIGH (ref 0.8–1.2)
Prothrombin Time: 23.1 seconds — ABNORMAL HIGH (ref 11.4–15.2)

## 2020-03-31 LAB — IRON AND TIBC
Iron: 42 ug/dL — ABNORMAL LOW (ref 45–182)
Saturation Ratios: 14 % — ABNORMAL LOW (ref 17.9–39.5)
TIBC: 300 ug/dL (ref 250–450)
UIBC: 258 ug/dL

## 2020-03-31 MED ORDER — WARFARIN SODIUM 7.5 MG PO TABS
7.5000 mg | ORAL_TABLET | Freq: Once | ORAL | Status: AC
  Administered 2020-03-31: 7.5 mg via ORAL
  Filled 2020-03-31: qty 1

## 2020-03-31 MED ORDER — SODIUM CHLORIDE 0.9 % IV SOLN
510.0000 mg | Freq: Once | INTRAVENOUS | Status: AC
  Administered 2020-03-31: 510 mg via INTRAVENOUS
  Filled 2020-03-31: qty 17

## 2020-03-31 NOTE — Telephone Encounter (Signed)
Patient called stating that Leonard Bentley seen him at the hospital this morning and he forgot to ask her a very important question.  Patient requests that Leonard Bentley call her ASAP.  CB#[phone].  Thank you.

## 2020-03-31 NOTE — Telephone Encounter (Signed)
Patient has questions about orthotics

## 2020-03-31 NOTE — TOC Progression Note (Addendum)
Transition of Care [facility]) - Progression Note    Patient Details  Name: Leonard Bentley MRN: [id number] Date of Birth: 1961-03-25  Transition of Care [facility]) CM/SW Contact  Jacalyn Lefevre Edson Snowball, RN Phone Number: 03/31/2020, 1:40 PM  Clinical Narrative:     Spoke to patient at bedside. Patient ambulated 250 feet. Patient plans to go home to his address "in a couple of days" .   Patient has found a "knee scooter without wheels". He believes he can "get around my cramped apartment with the knee scooter". Patient states his apartment is to cramped for his walker. Patient does not want wheel chair or shower bench.   Called [place] with [facility] , she is unable to provide charity HHPT.   [place] starts charity tomorrow. Spoke with Butch Penny. Butch Penny instructed NCM to call tomorrow.   Expected Discharge Plan: Home/Self Care Barriers to Discharge: Continued Medical Work up  Expected Discharge Plan and Services Expected Discharge Plan: Home/Self Care In-house Referral: Financial Counselor Discharge Planning Services: CM Consult Post Acute Care Choice: NA Living arrangements for the past 2 months: Single Family Home                   DME Agency: NA       HH Arranged: NA           Social Determinants of Health (SDOH) Interventions    Readmission Risk Interventions Readmission Risk Prevention Plan 03/21/2020 03/20/2020 01/31/2020  Transportation Screening Complete Complete Complete  PCP or Specialist Appt within 5-7 Days - - Complete  PCP or Specialist Appt within 3-5 Days Complete - -  Home Care Screening - - Complete  Medication Review (RN CM) - - Complete  HRI or Home Care Consult Complete - -  Social Work Consult for [place] Planning/Counseling Complete - -  Palliative Care Screening Not Applicable - -  Medication Review ([place]) Referral to Pharmacy Referral to Pharmacy -  PCP or Specialist appointment within 3-5 days of discharge Patient refused Complete  -  [place] or Home Care Consult Complete Complete -  SW Recovery Care/Counseling Consult Complete Complete -  Palliative Care Screening Not Applicable Not Applicable -  [place] Not Applicable Not Applicable -  Some recent data might be hidden

## 2020-03-31 NOTE — Progress Notes (Signed)
Progress Note    Leonard Bentley  DPO:[id number] DOB: 01/06/1961  DOA: 03/18/2020 PCP: [name], NP    Brief Narrative:     Medical records reviewed and are as summarized below:  Leonard Bentley is an 59 y.o. male with medical history significant ofHTN,chronic diastolic CHF, severe MRs/pmitral valve repair, recurrentPE, anxiety,non-Hodgkin's lymphoma, osteomyelitis of the left foot, and alcohol abuse presents with complaints of 1 week of chest tightness and shortness of breath.  Patient is status post transmetatarsal amputation for osteomyelitis.  Currently patient is pending safe discharge plan.  Assessment/Plan:   Principal Problem:   Pulmonary edema Active Problems:   Alcohol abuse   Cigarette smoker   Personal history of pulmonary embolism   Neuropathy due to chemotherapeutic drug ([place])   Anxiety   S/P minimally-invasive mitral valve repair   Supratherapeutic INR   Subacute osteomyelitis, left ankle and foot (HCC)   Wound dehiscence, surgical, initial encounter   Microcytic hypochromic anemia   Chest pain    Pulmonary edema/Acute on chronic diastolic HF/History of mitral valve repair BNP WNL, Troponins negative Chest x-ray significant for mild interstitial edema Echo showed EF noted to be 50-55% Strict intake and output, daily weight S/P IV diuresis Follow-up with cardiology as an outpatient  Supratherapeutic INR,R eye conjunctival hemorrhage On admission patient noted to have INR greater than 10 S/p 2.5 g of vitamin K p.o. Daily monitoring of PT/INR (lovenox d/c'd as INR >2) Pharmacy to dose  Wound dehiscence of left foot,history of osteomyelitis s/p transmetatarsal amputation with application of wound VAC on 03/21/2020 by Dr. [name] Last fever spike of 100.4 on 03/22/2020, with no leukocytosis Patient has history of osteomyelitis of the left foot for which he just recently had a third ray amputation by Dr. [name] on 01/30/20 CRP WNL, ESR minimally  elevated  X-rays of the left foot: suspicious for osteomyelitis  -Status post transmetatarsal amputation on 5/14 -seen by ortho on 5/24 with wound vac removed.    Alcohol abuse, with possible withdrawal Encouraged to continue alcohol cessation  Recurrent PEs on chronic anticoagulation D-dimer negative Patient with history of recurrent PEs on anticoagulation of Coumadin Coumadin per pharmacy  Neuropathy related with chemotherapy, history of Non-Hodgkin's lymphoma Patient with history of neuropathy thought to be related with chemotherapy drugs to treat non-Hodgkin's lymphoma Previously had been treated with systemic chemotherapy of CHOP and Rituxan last given in 11/2011 Continue gabapentin  Anxiety Continue Xanax as needed  Tobacco abuse Continue nicotine patch  Anemia -? ABLA -transfuse for < 7 or symptoms -Fe low-- will give IV fe x 1   Family Communication/Anticipated D/C date and plan/Code Status   DVT prophylaxis: Coumadin Code Status: Full Code.  Disposition Plan: Status is: Inpatient  Remains inpatient appropriate because:Unsafe d/c plan   Dispo: The patient is from: Home              Anticipated d/c is to: SNF/home? (homeless)              Anticipated d/c date is: when safe d/c plan in place              Patient currently is medically stable to d/c.    Medical Consultants:    Orthopedics  Subjective:   Plans to get up more as he is feeling weaker  Objective:    Vitals:   03/31/20 0031 03/31/20 0033 03/31/20 0500 03/31/20 0524  BP: 90/74 93/70  105/71  Pulse: 87 65  83  Resp: 17   17  Temp: 98 F (36.7 C)   98.1 F (36.7 C)  TempSrc: Oral   Oral  SpO2: 98%   100%  Weight:   87.6 kg   Height:        Intake/Output Summary (Last 24 hours) at 03/31/2020 1021 Last data filed at 03/31/2020 0600 Gross per 24 hour  Intake 480 ml  Output 450 ml  Net 30 ml   Filed Weights   03/29/20 0443 03/30/20 0531 03/31/20 0500  Weight: 87.3 kg 87.6  kg 87.6 kg    Exam:  Up working with PT, NAD  Data Reviewed:   I have personally reviewed following labs and imaging studies:  Labs: Labs show the following:   Basic Metabolic Panel: Recent Labs  Lab 03/25/20 0326 03/25/20 0326 03/26/20 0244 03/26/20 0244 03/27/20 0558 03/27/20 0558 03/28/20 0251 03/29/20 0239  NA 135  --  137  --  136  --  136 135  K 3.8   < > 4.2   < > 4.2   < > 4.0 4.0  CL 100  --  100  --  101  --  103 101  CO2 26  --  28  --  27  --  26 26  GLUCOSE 107*  --  132*  --  103*  --  95 136*  BUN 11  --  12  --  10  --  10 13  CREATININE 0.80  --  0.83  --  0.75  --  0.74 0.78  CALCIUM 8.9  --  9.3  --  9.2  --  8.9 9.1   < > = values in this interval not displayed.   GFR Estimated Creatinine Clearance: 108.6 mL/min (by C-G formula based on SCr of 0.78 mg/dL). Liver Function Tests: No results for input(s): AST, ALT, ALKPHOS, BILITOT, PROT, ALBUMIN in the last 168 hours. No results for input(s): LIPASE, AMYLASE in the last 168 hours. No results for input(s): AMMONIA in the last 168 hours. Coagulation profile Recent Labs  Lab 03/27/20 0558 03/28/20 0251 03/29/20 0239 03/30/20 0501 03/31/20 0506  INR 1.9* 2.2* 2.1* 2.1* 2.1*    CBC: Recent Labs  Lab 03/25/20 0326 03/26/20 0244 03/27/20 0558 03/28/20 0251 03/29/20 0239  WBC 5.2 4.6 3.8* 4.3 4.5  NEUTROABS 3.2 2.7 2.3 2.7 3.0  HGB 8.9* 8.9* 9.4* 8.8* 8.6*  HCT 27.5* 28.5* 30.3* 28.3* 27.7*  MCV 79.5* 79.2* 79.3* 78.4* 78.7*  PLT 181 211 241 254 290   Cardiac Enzymes: No results for input(s): CKTOTAL, CKMB, CKMBINDEX, TROPONINI in the last 168 hours. BNP (last 3 results) No results for input(s): PROBNP in the last 8760 hours. CBG: No results for input(s): GLUCAP in the last 168 hours. D-Dimer: No results for input(s): DDIMER in the last 72 hours. Hgb A1c: No results for input(s): HGBA1C in the last 72 hours. Lipid Profile: No results for input(s): CHOL, HDL, LDLCALC, TRIG,  CHOLHDL, LDLDIRECT in the last 72 hours. Thyroid function studies: No results for input(s): TSH, T4TOTAL, T3FREE, THYROIDAB in the last 72 hours.  Invalid input(s): FREET3 Anemia work up: Recent Labs    03/31/20 0506  TIBC 300  IRON 42*   Sepsis Labs: Recent Labs  Lab 03/26/20 0244 03/27/20 0558 03/28/20 0251 03/29/20 0239  WBC 4.6 3.8* 4.3 4.5    Microbiology No results found for this or any previous visit (from the past 240 hour(s)).  Procedures and diagnostic studies:  No results found.  Medications:   . docusate sodium  100 mg Oral BID  . folic acid  1 mg Oral Daily  . gabapentin  200 mg Oral TID  . metoprolol succinate  12.5 mg Oral Daily  . multivitamin with minerals  1 tablet Oral Daily  . mupirocin ointment   Nasal BID  . nicotine  21 mg Transdermal Daily  . sodium chloride flush  3 mL Intravenous Once  . sodium chloride flush  3 mL Intravenous Q12H  . thiamine  100 mg Oral Daily   Or  . thiamine  100 mg Intravenous Daily  . warfarin  7.5 mg Oral ONCE-1600  . Warfarin - Pharmacist Dosing Inpatient   Does not apply q1600   Continuous Infusions: . sodium chloride    . ferumoxytol       LOS: 12 days   [name]  [facility]   How to contact the [facility] Attending or Consulting provider [place] or covering provider during after hours [place], for this patient?  1. Check the care team in [facility] and look for a) attending/consulting [facility] provider listed and b) the [facility] team listed 2. Log into [email or web address] and use [place]'s universal password to access. If you do not have the password, please contact the hospital operator. 3. Locate the [facility] provider you are looking for under [facility] and page to a number that you can be directly reached. 4. If you still have difficulty reaching the provider, please page the [facility] (Director on Call) for the Hospitalists listed on amion for assistance.  03/31/2020, 10:21 AM

## 2020-03-31 NOTE — Progress Notes (Signed)
Patient is status post transmetatarsal amputation revision.  He is awake and comfortable this morning.  Blood is clotted in canister  Vital signs stable wound VAC was removed without difficulty.  There was some superficial wound necrosis on the top of the wound.  Dry dressing was placed will order for daily dressing changes will need to follow-up as an outpatient in 1 week

## 2020-03-31 NOTE — Progress Notes (Signed)
ANTICOAGULATION CONSULT NOTE - Follow Up Consult  Pharmacy Consult for Coumadin Indication: h/o VTE  Allergies  Allergen Reactions  . Other Other (See Comments)    "Cactus" caused blisters on tongue (if prepared as food)    Patient Measurements: Height: 5' 9.5" (176.5 cm) Weight: 87.6 kg (193 lb 2 oz) IBW/kg (Calculated) : 71.85   Vital Signs: Temp: 98.1 F (36.7 C) (05/24 0524) Temp Source: Oral (05/24 0524) BP: 105/71 (05/24 0524) Pulse Rate: 83 (05/24 0524)  Labs: Recent Labs    03/29/20 0239 03/30/20 0501 03/31/20 0506  HGB 8.6*  --   --   HCT 27.7*  --   --   PLT 290  --   --   LABPROT 22.5* 22.9* 23.1*  INR 2.1* 2.1* 2.1*  CREATININE 0.78  --   --     Estimated Creatinine Clearance: 108.6 mL/min (by C-G formula based on SCr of 0.78 mg/dL).  Assessment:  59 y/o male with history of  VTE (recurrent PE), MV repair on warfarin 7.5 mg daily PTA with a goal INR of 2-3.  Patient received VitK 2.5 mg PO on 5/11 for INR >10 on admission. On 5/12 PM, patient went into Afib. Warfarin was resumed on 5/13.   5/22 AM update: INR 2.1 therapeutic, Hbg 8.6 (stable), plt wnl, no bleeding noted.    Goal of Therapy:  INR 2-3 Monitor platelets by anticoagulation protocol: Yes   Plan:  Warfarin 7.5 mg PO x1 today Daily INR, monitor for s/sx of bleeding  Daymion Nazaire A. Levada Dy, PharmD, BCPS, FNKF Clinical Pharmacist [place] Please utilize Amion for appropriate phone number to reach the unit pharmacist ([place])   03/31/2020 8:18 AM

## 2020-03-31 NOTE — Progress Notes (Signed)
Occupational Therapy Treatment Patient Details Name: Leonard Bentley MRN: [id number] DOB: 15-Sep-1961 Today's Date: 03/31/2020    History of present illness Pt is a 59 y/o male admitted secondary to chest tightness. Pt found to have pulmonary edema with history of diastolic congestive heart.  Underwent Transmetatarsal amputation left foot with application Prevena wound VAC on 5/14; PMH including but not limited to HTN, chronic diastolic CHF, severe MR s/p mitral valve repair, recurrent PE, anxiety, non-Hodgkin's lymphoma, osteomyelitis of the left foot, and alcohol abuse.   OT comments  Patient sitting EOB on arrival. He is very motivated to participate and wants to get stronger before going home.  He needs to be reminded to take rest breaks when needed and not move too quickly.  Patient walked 326ft in hall with 2 rest breaks and min guard assist.  Able to stand and groom with supervision for 8 min while keeping weight bearing precautions.  Patient would benefit from further work on safety awareness and improving strength/balance for increase independence with ADLs.  Follow Up Recommendations  Home health OT;Supervision - Intermittent    Equipment Recommendations  Tub/shower seat    Recommendations for Other Services      Precautions / Restrictions Precautions Precautions: Fall Precaution Comments: NWB L LE Restrictions Weight Bearing Restrictions: Yes LLE Weight Bearing: Non weight bearing       Mobility Bed Mobility Overal bed mobility: Needs Assistance Bed Mobility: Sit to Supine       Sit to supine: Supervision;HOB elevated      Transfers Overall transfer level: Needs assistance Equipment used: Rolling walker (2 wheeled)   Sit to Stand: Min guard              Balance Overall balance assessment: Needs assistance Sitting-balance support: No upper extremity supported;Feet supported Sitting balance-Leahy Scale: Good     Standing balance support: During functional  activity;Bilateral upper extremity supported Standing balance-Leahy Scale: Poor Standing balance comment: reliant on external support in standing, especially dynamically                           ADL either performed or assessed with clinical judgement   ADL Overall ADL's : Needs assistance/impaired Eating/Feeding: Independent;Sitting   Grooming: Wash/dry hands;Wash/dry face;Oral care;Supervision/safety;Standing   Upper Body Bathing: Supervision/ safety;Sitting                           Functional mobility during ADLs: Min guard;Cueing for safety;Rolling walker       Vision       Perception     Praxis      Cognition Arousal/Alertness: Awake/alert Behavior During Therapy: WFL for tasks assessed/performed Overall Cognitive Status: Within Functional Limits for tasks assessed Area of Impairment: Safety/judgement                               General Comments: Very motivated to participate, though a little impulsive and pushes self        Exercises Exercises: Other exercises Other Exercises Other Exercises: Walk 300 ft Other Exercises: Stood 8 min grooming   Shoulder Instructions       General Comments      Pertinent Vitals/ Pain       Pain Assessment: No/denies pain  Home Living  Prior Functioning/Environment              Frequency  Min 2X/week        Progress Toward Goals  OT Goals(current goals can now be found in the care plan section)  Progress towards OT goals: Progressing toward goals  Acute Rehab OT Goals Patient Stated Goal: to get stronger before goin ghome OT Goal Formulation: With patient Time For Goal Achievement: 04/05/20 Potential to Achieve Goals: Good  Plan Discharge plan remains appropriate    Co-evaluation                 AM-PAC OT "6 Clicks" Daily Activity     Outcome Measure   Help from another person eating meals?:  None Help from another person taking care of personal grooming?: A Little Help from another person toileting, which includes using toliet, bedpan, or urinal?: A Little Help from another person bathing (including washing, rinsing, drying)?: A Little Help from another person to put on and taking off regular upper body clothing?: None Help from another person to put on and taking off regular lower body clothing?: A Little 6 Click Score: 20    End of Session Equipment Utilized During Treatment: Rolling walker;Other (comment)(post op shoe)  OT Visit Diagnosis: Other abnormalities of gait and mobility (R26.89);Unsteadiness on feet (R26.81);Pain   Activity Tolerance Patient tolerated treatment well   Patient Left in bed;with call bell/phone within reach   Nurse Communication Mobility status        Time: 1110-0949 OT Time Calculation (min): 1359 min  Charges: OT General Charges $OT Visit: 1 Visit OT Treatments $Self Care/Home Management : 8-22 mins $Therapeutic Activity: 8-22 mins  August Luz, OTR/L    Phylliss Bob 03/31/2020, 12:03 PM

## 2020-03-31 NOTE — Telephone Encounter (Signed)
Please see message below

## 2020-03-31 NOTE — Plan of Care (Signed)
  Problem: Education: Goal: Knowledge of General Education information will improve Description Including pain rating scale, medication(s)/side effects and non-pharmacologic comfort measures Outcome: Progressing   

## 2020-03-31 NOTE — Progress Notes (Signed)
Physical Therapy Treatment Patient Details Name: Leonard Bentley MRN: [id number] DOB: 26-Jul-1961 Today's Date: 03/31/2020    History of Present Illness Pt is a 59 y/o male admitted secondary to chest tightness. Pt found to have pulmonary edema with history of diastolic congestive heart.  Underwent Transmetatarsal amputation left foot with application Prevena wound VAC on 5/14, removed 5/24; PMH including but not limited to HTN, chronic diastolic CHF, severe MR s/p mitral valve repair, recurrent PE, anxiety, non-Hodgkin's lymphoma, osteomyelitis of the left foot, and alcohol abuse.    PT Comments    Patient progressing well towards PT goals. Improved ambulation distance with use of RW for support. Able to maintain NWB LLE during mobility but requires multiple standing rest breaks due to fatigue and SOB. Pt tends to push self but concerned about self monitoring activity. Pt inquiring about a specific knee scooter from [employer] (no wheels) to wear during NWB time period. Would benefit from being trained to use this DME from a therapist. Will continue to follow for strengthening, endurance and safety.    Follow Up Recommendations  Home health PT;SNF     Equipment Recommendations  Other (comment);Wheelchair (measurements PT);Wheelchair cushion (measurements PT)    Recommendations for Other Services       Precautions / Restrictions Precautions Precautions: Fall Precaution Comments: NWB L LE Restrictions Weight Bearing Restrictions: Yes LLE Weight Bearing: Non weight bearing Other Position/Activity Restrictions: Strict NWB per Ortho note; post-op shoe donned    Mobility  Bed Mobility Overal bed mobility: Needs Assistance Bed Mobility: Supine to Sit     Supine to sit: Supervision;HOB elevated Sit to supine: Supervision;HOB elevated   General bed mobility comments: Supervision for safety.  Transfers Overall transfer level: Needs assistance Equipment used: Rolling walker (2  wheeled) Transfers: Sit to/from Stand Sit to Stand: Min guard         General transfer comment: Min guard for safety. Stood from [employer].  Ambulation/Gait Ambulation/Gait assistance: Min guard Gait Distance (Feet): 250 Feet Assistive device: Rolling walker (2 wheeled) Gait Pattern/deviations: Step-to pattern;Trunk flexed("hop to") Gait velocity: decr   General Gait Details: Hop to gait pattern with 3 standing rest breaks; 2-3/4 DOE. Compliant with NWB LLE.   Stairs             Wheelchair Mobility    Modified Rankin (Stroke Patients Only)       Balance Overall balance assessment: Needs assistance Sitting-balance support: No upper extremity supported;Feet supported Sitting balance-Leahy Scale: Good Sitting balance - Comments: Able to reach outside BoS and donn post op shoe   Standing balance support: During functional activity Standing balance-Leahy Scale: Poor Standing balance comment: reliant on external support in standing, especially dynamically                            Cognition Arousal/Alertness: Awake/alert Behavior During Therapy: WFL for tasks assessed/performed Overall Cognitive Status: Impaired/Different from baseline Area of Impairment: Safety/judgement                         Safety/Judgement: Decreased awareness of safety   Problem Solving: Requires verbal cues General Comments: Asking appropriate questions relating to recovery time, WB status, DME needs, exercise etc. Pushes self and needs cues to self monitor for activity.      Exercises Other Exercises Other Exercises: Walk 300 ft Other Exercises: Stood 8 min grooming    General Comments General comments (skin integrity, edema,  etc.): Wound vac removed and dressing clean and dry. Inquiring about knee walker without wheels for home use.      Pertinent Vitals/Pain Pain Assessment: No/denies pain    Home Living                      Prior Function             PT Goals (current goals can now be found in the care plan section) Acute Rehab PT Goals Patient Stated Goal: to get stronger before goin ghome Progress towards PT goals: Progressing toward goals    Frequency    Min 3X/week      PT Plan Current plan remains appropriate    Co-evaluation              AM-PAC PT "6 Clicks" Mobility   Outcome Measure  Help needed turning from your back to your side while in a flat bed without using bedrails?: A Little Help needed moving from lying on your back to sitting on the side of a flat bed without using bedrails?: A Little Help needed moving to and from a bed to a chair (including a wheelchair)?: A Little Help needed standing up from a chair using your arms (e.g., wheelchair or bedside chair)?: A Little Help needed to walk in hospital room?: A Little Help needed climbing 3-5 steps with a railing? : A Little 6 Click Score: 18    End of Session Equipment Utilized During Treatment: Gait belt Activity Tolerance: Patient tolerated treatment well Patient left: in bed;with call bell/phone within reach Nurse Communication: Mobility status PT Visit Diagnosis: Other abnormalities of gait and mobility (R26.89)     Time: [id number] PT Time Calculation (min) (ACUTE ONLY): 36 min  Charges:  $Gait Training: 8-22 mins $Self Care/Home Management: 8-22                     Zettie Cooley, DPT Acute Rehabilitation Services Pager [phone] Office [phone]       Marguarite Arbour A Sabra Heck 03/31/2020, 12:56 PM

## 2020-04-01 ENCOUNTER — Telehealth: Payer: Self-pay

## 2020-04-01 LAB — PROTIME-INR
INR: 2.3 — ABNORMAL HIGH (ref 0.8–1.2)
Prothrombin Time: 24.2 seconds — ABNORMAL HIGH (ref 11.4–15.2)

## 2020-04-01 MED ORDER — WARFARIN SODIUM 7.5 MG PO TABS
7.5000 mg | ORAL_TABLET | Freq: Once | ORAL | Status: AC
  Administered 2020-04-01: 7.5 mg via ORAL
  Filled 2020-04-01: qty 1

## 2020-04-01 NOTE — Progress Notes (Signed)
ANTICOAGULATION CONSULT NOTE - Follow Up Consult  Pharmacy Consult for Warfarin Indication: hx recurrent PE (last 04/2016) and MV repair (2017)  Allergies  Allergen Reactions  . Other Other (See Comments)    "Cactus" caused blisters on tongue (if prepared as food)    Patient Measurements: Height: 5' 9.5" (176.5 cm) Weight: 85.1 kg (187 lb 9.8 oz) IBW/kg (Calculated) : 71.85  Vital Signs: Temp: 98.1 F (36.7 C) (05/25 0522) Temp Source: Oral (05/25 0522) BP: 99/69 (05/25 0522) Pulse Rate: 80 (05/25 0522)  Labs: Recent Labs    03/30/20 0501 03/31/20 0506 04/01/20 0548  LABPROT 22.9* 23.1* 24.2*  INR 2.1* 2.1* 2.3*    Estimated Creatinine Clearance: 99.9 mL/min (by C-G formula based on SCr of 0.78 mg/dL).  Assessment:  58 y/o male with history of  VTE (recurrent PE), MV repair on warfarin 7.5 mg daily PTA with a goal INR of 2-3.  Patient received Vit K 2.5 mg PO on 5/11 for INR >10 on admission. Warfarin was resumed on 5/13.    INR remains therapeutic (2.3).  Has had Warfarin 7.5 mg the last 3 days with INR 2.1 x 3 consecutive checks.   Ortho notes some bleeding with wound VAC removal on 5/24, not unexpected.  No visible bleeding on dressing today.   Fe low 5/24 > Feraheme 510 mg IV x 1 given.  Goal of Therapy:  INR 2-3 Monitor platelets by anticoagulation protocol: Yes   Plan:   Warfarin 7.5 mg x 1 again today.  Daily PT/INR.  If INR trends up again tomorrow, will decrease dose.  Arty Baumgartner, [place] Phone: [phone] 04/01/2020,12:25 PM

## 2020-04-01 NOTE — Discharge Instructions (Signed)
Information on my medicine - Coumadin   (Warfarin)  Why was Coumadin prescribed for you? Coumadin was prescribed for you because you have a blood clot or a medical condition that can cause an increased risk of forming blood clots. Blood clots can cause serious health problems by blocking the flow of blood to the heart, lung, or brain. Coumadin can prevent harmful blood clots from forming. As a reminder your indication for Coumadin is:   Pulmonary Embolism Treatment  Blood clots in the lungs several times in the past.   What test will check on my response to Coumadin? While on Coumadin (warfarin) you will need to have an INR test regularly to ensure that your dose is keeping you in the desired range. The INR (international normalized ratio) number is calculated from the result of the laboratory test called prothrombin time (PT).  If an INR APPOINTMENT HAS NOT ALREADY BEEN MADE FOR YOU please schedule an appointment to have this lab work done by your health care provider within 7 days. Your INR goal is usually a number between:  2 to 3 or your provider may give you a more narrow range like 2-2.5.  Ask your health care provider during an office visit what your goal INR is.  What  do you need to  know  About  COUMADIN? Take Coumadin (warfarin) exactly as prescribed by your healthcare provider about the same time each [place].  DO NOT stop taking without talking to the doctor who prescribed the medication.  Stopping without other blood clot prevention medication to take the place of Coumadin may increase your risk of developing a new clot or stroke.  Get refills before you run out.  What do you do if you miss a dose? If you miss a dose, take it as soon as you remember on the same [place] then continue your regularly scheduled regimen the next [place].  Do not take two doses of Coumadin at the same time.  Important Safety Information A possible side effect of Coumadin (Warfarin) is an increased risk of bleeding.  You should call your healthcare provider right away if you experience any of the following: ? Bleeding from an injury or your nose that does not stop. ? Unusual colored urine (red or dark brown) or unusual colored stools (red or black). ? Unusual bruising for unknown reasons. ? A serious fall or if you hit your head (even if there is no bleeding).  Some foods or medicines interact with Coumadin (warfarin) and might alter your response to warfarin. To help avoid this: ? Eat a balanced diet, maintaining a consistent amount of Vitamin K. ? Notify your provider about major diet changes you plan to make. ? Avoid alcohol or limit your intake to 1 drink for women and 2 drinks for men per [place]. (1 drink is 5 oz. wine, 12 oz. beer, or 1.5 oz. liquor.)  Make sure that ANY health care provider who prescribes medication for you knows that you are taking Coumadin (warfarin).  Also make sure the healthcare provider who is monitoring your Coumadin knows when you have started a new medication including herbals and non-prescription products.  Coumadin (Warfarin)  Major Drug Interactions  Increased Warfarin Effect Decreased Warfarin Effect  Alcohol (large quantities) Antibiotics (esp. Septra/Bactrim, Flagyl, Cipro) Amiodarone (Cordarone) Aspirin (ASA) Cimetidine (Tagamet) Megestrol (Megace) NSAIDs (ibuprofen, naproxen, etc.) Piroxicam (Feldene) Propafenone (Rythmol SR) Propranolol (Inderal) Isoniazid (INH) Posaconazole (Noxafil) Barbiturates (Phenobarbital) Carbamazepine (Tegretol) Chlordiazepoxide (Librium) Cholestyramine (Questran) Griseofulvin Oral Contraceptives Rifampin Sucralfate (  Carafate) Vitamin K   Coumadin (Warfarin) Major Herbal Interactions  Increased Warfarin Effect Decreased Warfarin Effect  Garlic Ginseng Ginkgo biloba Coenzyme Q10 Green tea St. Johns wort    Coumadin (Warfarin) FOOD Interactions  Eat a consistent number of servings per week of foods HIGH in Vitamin  K (1 serving =  cup)  Collards (cooked, or boiled & drained) Kale (cooked, or boiled & drained) Mustard greens (cooked, or boiled & drained) Parsley *serving size only =  cup Spinach (cooked, or boiled & drained) Swiss chard (cooked, or boiled & drained) Turnip greens (cooked, or boiled & drained)  Eat a consistent number of servings per week of foods MEDIUM-HIGH in Vitamin K (1 serving = 1 cup)  Asparagus (cooked, or boiled & drained) Broccoli (cooked, boiled & drained, or raw & chopped) Brussel sprouts (cooked, or boiled & drained) *serving size only =  cup Lettuce, raw (green leaf, endive, romaine) Spinach, raw Turnip greens, raw & chopped   These websites have more information on Coumadin (warfarin):  FailFactory.se; VeganReport.com.au;

## 2020-04-01 NOTE — Telephone Encounter (Signed)
Patient called wanting to know if he should be concerned about the bleeding from his left foot incision after he has PT.  Stated that the blood is not running or gushing out, but the dressing had a lot of blood. Patient had midfoot amputation, left on 03/21/2020. Cb# [id number].  Please advise.  Thank you.

## 2020-04-01 NOTE — Progress Notes (Signed)
Wound VAC removed yesterday.  Patient did have some bleeding through the dressing.  Vital signs stable patient is up and about in his room dressings currently dry  Doing well should follow-up in our office in 1 week.  Suspect drainage is not unexpected as patient is on anticoagulation may change dressing as needed

## 2020-04-01 NOTE — Progress Notes (Signed)
Progress Note    Leonard Bentley  KPV:[id number] DOB: 1961/08/18  DOA: 03/18/2020 PCP: Leonard Pounds, NP    Brief Narrative:     Medical records reviewed and are as summarized below:  Leonard Bentley is an 59 y.o. male with medical history significant ofHTN,chronic diastolic CHF, severe MRs/pmitral valve repair, recurrentPE, anxiety,non-Hodgkin's lymphoma, osteomyelitis of the left foot, and alcohol abuse presents with complaints of 1 week of chest tightness and shortness of breath.  Patient is status post transmetatarsal amputation for osteomyelitis.  VAC was removed on 5/24.  Per discussion with patient he will have an adequate living situation on Thursday/Friday   Assessment/Plan:   Principal Problem:   Pulmonary edema Active Problems:   Alcohol abuse   Cigarette smoker   Personal history of pulmonary embolism   Neuropathy due to chemotherapeutic drug ([place])   Anxiety   S/P minimally-invasive mitral valve repair   Supratherapeutic INR   Subacute osteomyelitis, left ankle and foot (HCC)   Wound dehiscence, surgical, initial encounter   Microcytic hypochromic anemia   Chest pain    Pulmonary edema/Acute on chronic diastolic HF/History of mitral valve repair BNP WNL, Troponins negative Chest x-ray significant for mild interstitial edema Echo showed EF noted to be 50-55% Strict intake and output, daily weight S/P IV diuresis Follow-up with cardiology as an outpatient  Supratherapeutic INR,R eye conjunctival hemorrhage On admission patient noted to have INR greater than 10 S/p 2.5 g of vitamin K p.o. Daily monitoring of PT/INR (lovenox d/c'd as INR >2) Pharmacy to dose  Wound dehiscence of left foot,history of osteomyelitis s/p transmetatarsal amputation with application of wound VAC on 03/21/2020 by Dr. [name] Last fever spike of 100.4 on 03/22/2020, with no leukocytosis Patient has history of osteomyelitis of the left foot for which he just recently had a third  ray amputation by Dr. [name] on 01/30/20 CRP WNL, ESR minimally elevated  X-rays of the left foot: suspicious for osteomyelitis  -Status post transmetatarsal amputation on 5/14 -seen by ortho on 5/24 with wound vac removed.    Alcohol abuse, with possible withdrawal Encouraged to continue alcohol cessation  Recurrent PEs on chronic anticoagulation D-dimer negative Patient with history of recurrent PEs on anticoagulation of Coumadin Coumadin per pharmacy  Neuropathy related with chemotherapy, history of Non-Hodgkin's lymphoma Patient with history of neuropathy thought to be related with chemotherapy drugs to treat non-Hodgkin's lymphoma Previously had been treated with systemic chemotherapy of CHOP and Rituxan last given in 11/2011 Continue gabapentin  Anxiety Continue Xanax as needed  Tobacco abuse Continue nicotine patch  Anemia -? ABLA -transfuse for < 7 or symptoms -Fe low-- will give IV fe x 1   Family Communication/Anticipated D/C date and plan/Code Status   DVT prophylaxis: Coumadin Code Status: Full Code.  Disposition Plan: Status is: Inpatient  Remains inpatient appropriate because:Unsafe d/c plan   Dispo: The patient is from: Home              Anticipated d/c is to: home              Anticipated d/c date is: Per patient he will have an adequate/safe discharge place on Thursday/Friday              Patient currently is medically stable to d/c.    Medical Consultants:    Orthopedics  Subjective:   Denies dizziness with getting up Has been working on ambulation with therapy No chest pain or shortness of breath  Objective:  Vitals:   03/31/20 1303 03/31/20 1840 03/31/20 2341 04/01/20 0522  BP: 117/90 (!) 115/94 115/87 99/69  Pulse: 95 83 93 80  Resp: _0 Temp: 98.3 F (36.8 C) 98.6 F (37 C) 97.9 F (36.6 C) 98.1 F (36.7 C)  TempSrc: Oral Oral Oral Oral  SpO2: 100%  95% 96%  Weight:    85.1 kg  Height:        Intake/Output  Summary (Last 24 hours) at 04/01/2020 0845 Last data filed at 04/01/2020 0527 Gross per 24 hour  Intake 237 ml  Output 900 ml  Net -663 ml   Filed Weights   03/30/20 0531 03/31/20 0500 04/01/20 0522  Weight: 87.6 kg 87.6 kg 85.1 kg    Exam:  At sink conjunctival hemorrhage improved  Data Reviewed:   I have personally reviewed following labs and imaging studies:  Labs: Labs show the following:   Basic Metabolic Panel: Recent Labs  Lab 03/26/20 0244 03/26/20 0244 03/27/20 0558 03/27/20 0558 03/28/20 0251 03/29/20 0239  NA 137  --  136  --  136 135  K 4.2   < > 4.2   < > 4.0 4.0  CL 100  --  101  --  103 101  CO2 28  --  27  --  26 26  GLUCOSE 132*  --  103*  --  95 136*  BUN 12  --  10  --  10 13  CREATININE 0.83  --  0.75  --  0.74 0.78  CALCIUM 9.3  --  9.2  --  8.9 9.1   < > = values in this interval not displayed.   GFR Estimated Creatinine Clearance: 99.9 mL/min (by C-G formula based on SCr of 0.78 mg/dL). Liver Function Tests: No results for input(s): AST, ALT, ALKPHOS, BILITOT, PROT, ALBUMIN in the last 168 hours. No results for input(s): LIPASE, AMYLASE in the last 168 hours. No results for input(s): AMMONIA in the last 168 hours. Coagulation profile Recent Labs  Lab 03/28/20 0251 03/29/20 0239 03/30/20 0501 03/31/20 0506 04/01/20 0548  INR 2.2* 2.1* 2.1* 2.1* 2.3*    CBC: Recent Labs  Lab 03/26/20 0244 03/27/20 0558 03/28/20 0251 03/29/20 0239  WBC 4.6 3.8* 4.3 4.5  NEUTROABS 2.7 2.3 2.7 3.0  HGB 8.9* 9.4* 8.8* 8.6*  HCT 28.5* 30.3* 28.3* 27.7*  MCV 79.2* 79.3* 78.4* 78.7*  PLT 211 241 254 290   Cardiac Enzymes: No results for input(s): CKTOTAL, CKMB, CKMBINDEX, TROPONINI in the last 168 hours. BNP (last 3 results) No results for input(s): PROBNP in the last 8760 hours. CBG: No results for input(s): GLUCAP in the last 168 hours. D-Dimer: No results for input(s): DDIMER in the last 72 hours. Hgb A1c: No results for input(s):  HGBA1C in the last 72 hours. Lipid Profile: No results for input(s): CHOL, HDL, LDLCALC, TRIG, CHOLHDL, LDLDIRECT in the last 72 hours. Thyroid function studies: No results for input(s): TSH, T4TOTAL, T3FREE, THYROIDAB in the last 72 hours.  Invalid input(s): FREET3 Anemia work up: Recent Labs    03/31/20 0506  TIBC 300  IRON 42*   Sepsis Labs: Recent Labs  Lab 03/26/20 0244 03/27/20 0558 03/28/20 0251 03/29/20 0239  WBC 4.6 3.8* 4.3 4.5    Microbiology No results found for this or any previous visit (from the past 240 hour(s)).  Procedures and diagnostic studies:  No results found.  Medications:   . docusate sodium  100 mg Oral BID  .  folic acid  1 mg Oral Daily  . gabapentin  200 mg Oral TID  . metoprolol succinate  12.5 mg Oral Daily  . multivitamin with minerals  1 tablet Oral Daily  . mupirocin ointment   Nasal BID  . nicotine  21 mg Transdermal Daily  . sodium chloride flush  3 mL Intravenous Once  . sodium chloride flush  3 mL Intravenous Q12H  . thiamine  100 mg Oral Daily   Or  . thiamine  100 mg Intravenous Daily  . Warfarin - Pharmacist Dosing Inpatient   Does not apply q1600   Continuous Infusions: . sodium chloride       LOS: 13 days   [name]  [facility]   How to contact the [facility] Attending or Consulting provider Guy or covering provider during after hours [place], for this patient?  1. Check the care team in [facility] and look for a) attending/consulting TR[facility] provider listed and b) the [facility] team listed 2. Log into [email or web address] and use [place]'s universal password to access. If you do not have the password, please contact the hospital operator. 3. Locate the [facility] provider you are looking for under [facility] and page to a number that you can be directly reached. 4. If you still have difficulty reaching the provider, please page the [facility] (Director on Call) for the Hospitalists listed on amion for assistance.  04/01/2020,  8:45 AM

## 2020-04-01 NOTE — Care Management (Addendum)
Butch Penny with [facility] unable to accept patient for home health services.   1500 Patient and Dr Eliseo Squires aware patient will not have HHPT. Patient has ordered a knee crutch from [facility] that will be delivered to his home Thursday. He will ask his friend to bring the knee crutch to the hospital Thursday and put it together. Patient requesting discharge to be Friday.   Butch Penny with [facility] will speak with patient and [facility] office to see if patient qualifies for charity home health PT and if they have staffing to accept.   Magdalen Spatz

## 2020-04-02 LAB — BASIC METABOLIC PANEL
Anion gap: 9 (ref 5–15)
BUN: 11 mg/dL (ref 6–20)
CO2: 25 mmol/L (ref 22–32)
Calcium: 9.1 mg/dL (ref 8.9–10.3)
Chloride: 102 mmol/L (ref 98–111)
Creatinine, Ser: 0.78 mg/dL (ref 0.61–1.24)
GFR calc Af Amer: >60 mL/min (ref >60)
GFR calc non Af Amer: >60 mL/min (ref >60)
Glucose, Bld: 125 mg/dL — ABNORMAL HIGH (ref 70–99)
Potassium: 3.9 mmol/L (ref 3.5–5.1)
Sodium: 136 mmol/L (ref 135–145)

## 2020-04-02 LAB — CBC
HCT: 28 % — ABNORMAL LOW (ref 39.0–52.0)
Hemoglobin: 8.5 g/dL — ABNORMAL LOW (ref 13.0–17.0)
MCH: 24.1 pg — ABNORMAL LOW (ref 26.0–34.0)
MCHC: 30.4 g/dL (ref 30.0–36.0)
MCV: 79.3 fL — ABNORMAL LOW (ref 80.0–100.0)
Platelets: 431 10*3/uL — ABNORMAL HIGH (ref 150–400)
RBC: 3.53 MIL/uL — ABNORMAL LOW (ref 4.22–5.81)
RDW: 14.1 % (ref 11.5–15.5)
WBC: 3.2 10*3/uL — ABNORMAL LOW (ref 4.0–10.5)
nRBC: 0 % (ref 0.0–0.2)

## 2020-04-02 LAB — PROTIME-INR
INR: 2.4 — ABNORMAL HIGH (ref 0.8–1.2)
Prothrombin Time: 25.5 seconds — ABNORMAL HIGH (ref 11.4–15.2)

## 2020-04-02 MED ORDER — WARFARIN SODIUM 5 MG PO TABS
5.0000 mg | ORAL_TABLET | Freq: Once | ORAL | Status: AC
  Administered 2020-04-02: 5 mg via ORAL
  Filled 2020-04-02: qty 1

## 2020-04-02 NOTE — Progress Notes (Signed)
Physical Therapy Treatment Patient Details Name: Leonard Bentley MRN: [id number] DOB: [birth year] Today's Date: 04/02/2020    History of Present Illness Pt is a 59 y/o male admitted secondary to chest tightness. Pt found to have pulmonary edema with history of diastolic congestive heart.  Underwent Transmetatarsal amputation left foot with application Prevena wound VAC on 5/14, removed 5/24; PMH including but not limited to HTN, chronic diastolic CHF, severe MR s/p mitral valve repair, recurrent PE, anxiety, non-Hodgkin's lymphoma, osteomyelitis of the left foot, and alcohol abuse.    PT Comments    Pt making good progress but required cues and education on self monitoring activity and not overdoing.  Discussed safe ambulation and exercise techniques.  Also, discussed elevation on legs at rest.  Pt has ordered a knee crutch and will need gait training when it arrives (expected tomorrow or Friday).    Follow Up Recommendations  Home health PT     Equipment Recommendations  Other (comment);Wheelchair cushion (measurements PT);Wheelchair (measurements PT)(pt has ordered a knee crutch; has walker)    Recommendations for Other Services       Precautions / Restrictions Precautions Precautions: Fall Precaution Comments: NWB L LE Restrictions LLE Weight Bearing: Non weight bearing Other Position/Activity Restrictions: Strict NWB per Ortho note; post-op shoe donned    Mobility  Bed Mobility Overal bed mobility: Needs Assistance Bed Mobility: Supine to Sit;Sit to Supine     Supine to sit: Independent Sit to supine: Independent      Transfers Overall transfer level: Needs assistance Equipment used: Rolling walker (2 wheeled) Transfers: Sit to/from Stand Sit to Stand: Supervision            Ambulation/Gait Ambulation/Gait assistance: Supervision Gait Distance (Feet): 100 Feet(x3) Assistive device: Rolling walker (2 wheeled)       General Gait Details: Hop to gait with 3  seated rest breaks.  Pt found ambulating in hall alone a significant distance from room. Pt required cues for rest breaks. Compliant with NWB.   Stairs             Wheelchair Mobility    Modified Rankin (Stroke Patients Only)       Balance Overall balance assessment: Needs assistance Sitting-balance support: No upper extremity supported;Feet supported Sitting balance-Leahy Scale: Normal     Standing balance support: During functional activity Standing balance-Leahy Scale: Poor Standing balance comment: reliant on external support in standing, especially dynamically                            Cognition Arousal/Alertness: Awake/alert Behavior During Therapy: WFL for tasks assessed/performed Overall Cognitive Status: Impaired/Different from baseline Area of Impairment: Safety/judgement                         Safety/Judgement: Decreased awareness of safety     General Comments: Pt tends to push self and needs cues to self monitor activity      Exercises Other Exercises Other Exercises: Pt demonstrating oblique crunches at EOB as exercise - advised doing completely in bed to prevent sliding off. Additionally, pt reports doing tricep push downs in walker while walking - advised doing in front of a chair or bed and not while walking.    General Comments General comments (skin integrity, edema, etc.): Pt needed education on rest breaks and monitoring activity.  Educated on importance of elevating both legs during the [place] - not just at night.  Discussed  safe ambulation distances near staff and chairs for breaks preferably with staff (pt was found walking out toward [employer] with no seats available).      Pertinent Vitals/Pain Pain Assessment: No/denies pain    Home Living                      Prior Function            PT Goals (current goals can now be found in the care plan section) Acute Rehab PT Goals Patient Stated Goal: to get  stronger before goin ghome PT Goal Formulation: With patient Time For Goal Achievement: 04/05/20 Potential to Achieve Goals: Good Progress towards PT goals: Progressing toward goals    Frequency    Min 3X/week      PT Plan Current plan remains appropriate    Co-evaluation              AM-PAC PT "6 Clicks" Mobility   Outcome Measure  Help needed turning from your back to your side while in a flat bed without using bedrails?: None Help needed moving from lying on your back to sitting on the side of a flat bed without using bedrails?: None Help needed moving to and from a bed to a chair (including a wheelchair)?: None Help needed standing up from a chair using your arms (e.g., wheelchair or bedside chair)?: None Help needed to walk in hospital room?: None Help needed climbing 3-5 steps with a railing? : A Little 6 Click Score: 23    End of Session Equipment Utilized During Treatment: Gait belt Activity Tolerance: Patient tolerated treatment well Patient left: in bed;with call bell/phone within reach Nurse Communication: Mobility status PT Visit Diagnosis: Other abnormalities of gait and mobility (R26.89)     Time: [id number] PT Time Calculation (min) (ACUTE ONLY): 30 min  Charges:  $Gait Training: 8-22 mins $Therapeutic Exercise: 8-22 mins                     Maggie Font, PT Acute Rehab Services Pager [phone] [facility] [phone] [facility] 04/02/2020, 3:46 PM

## 2020-04-02 NOTE — Telephone Encounter (Signed)
I called pt he is still in the hospital. He has nursing and PA eval foot and change dressing daily and is being monitored while there. If he had been at home I would of had him come in for office visit to eval surgical site but he is in the hospital and this is being monitored  and he should raise concerns if any with nursing taking care of him while there.

## 2020-04-02 NOTE — Progress Notes (Signed)
ANTICOAGULATION CONSULT NOTE - Follow Up Consult  Pharmacy Consult for Warfarin Indication: hx recurrent PE (last 04/2016) and MV repair (2017)  Allergies  Allergen Reactions  . Other Other (See Comments)    "Cactus" caused blisters on tongue (if prepared as food)    Patient Measurements: Height: 5' 9.5" (176.5 cm) Weight: 85.1 kg (187 lb 9.8 oz) IBW/kg (Calculated) : 71.85  Vital Signs: Temp: 98 F (36.7 C) (05/26 1234) Temp Source: Oral (05/26 1234) BP: 109/77 (05/26 1234) Pulse Rate: 85 (05/26 1234)  Labs: Recent Labs    03/31/20 0506 04/01/20 0548 04/02/20 0511  HGB  --   --  8.5*  HCT  --   --  28.0*  PLT  --   --  431*  LABPROT 23.1* 24.2* 25.5*  INR 2.1* 2.3* 2.4*  CREATININE  --   --  0.78    Estimated Creatinine Clearance: 99.9 mL/min (by C-G formula based on SCr of 0.78 mg/dL).  Assessment:  59 y/o male with history of  VTE (recurrent PE), MV repair on warfarin 7.5 mg daily PTA with a goal INR of 2-3.  Patient received Vit K 2.5 mg PO on 5/11 for INR >10 on admission. Warfarin was resumed on 5/13.    INR remains therapeutic (2.4).  Has had Warfarin 7.5 mg the last 4 days with INR 2.1 x 3 consecutive checks then 2.3 yesterday. Previously on warfarin 5 mg daily, then increased to 7.5 mg daily during 3/22 - 4/19 admission.  INRs had seemed stable at discharge, then outpt INR 2.8 on 4/22, then >10 on admit 5/11. Dose 7.5 mg daily likely too high in the long run.    Ortho noted some bleeding with wound VAC removal on 5/24, not unexpected.  No visible bleeding on dressing today.   Fe low 5/24 > Feraheme 510 mg IV x 1 given.  Goal of Therapy:  INR 2-3 Monitor platelets by anticoagulation protocol: Yes   Plan:   Warfarin 5 mg x 1 today.  Daily PT/INR.  Arty Baumgartner, [place] Phone: [phone] 04/02/2020,1:44 PM

## 2020-04-02 NOTE — Progress Notes (Signed)
PROGRESS NOTE    Leonard Bentley  XBJ:[id number] DOB: Apr 27, 1961 DOA: 03/18/2020 PCP: [name], NP   Brief Narrative:   Leonard Bentley is an 59 y.o. male with medical history significant ofHTN,chronic diastolic CHF, severe MRs/pmitral valve repair, recurrentPE, anxiety,non-Hodgkin's lymphoma, osteomyelitis of the left foot, and alcohol abuse presents with complaints of 1 week of chest tightness and shortness of breath.  Patient is status post transmetatarsal amputation for osteomyelitis.  VAC was removed on 5/24.  Per discussion with patient he will have an adequate living situation on Thursday/Friday  Assessment & Plan:   Principal Problem:   Pulmonary edema Active Problems:   Alcohol abuse   Cigarette smoker   Personal history of pulmonary embolism   Neuropathy due to chemotherapeutic drug ([place])   Anxiety   S/P minimally-invasive mitral valve repair   Supratherapeutic INR   Subacute osteomyelitis, left ankle and foot (HCC)   Wound dehiscence, surgical, initial encounter   Microcytic hypochromic anemia   Chest pain   Pulmonary edema/Acute on chronic diastolic HF/History of mitral valve repair BNP WNL, Troponins negative Chest x-ray significant for mild interstitial edema Echo showed EF noted to be 50-55% Strict intake and output, daily weight S/P IV diuresis Follow-up with cardiology as an outpatient  Supratherapeutic INR,R eye conjunctival hemorrhage On admission patient noted to have INR greater than 10 S/p 2.5 g of vitamin K p.o. Daily monitoring of PT/INR (lovenox d/c'd as INR >2) Pharmacy to dose.  INR therapeutic today.  Wound dehiscence of left foot,history of osteomyelitis s/p transmetatarsal amputation with application of wound VAC on 03/21/2020 by Dr. [name] Last fever spike of 100.4 on 03/22/2020, with no leukocytosis Patient has history of osteomyelitis of the left foot for which he just recently had a third ray amputation by Dr. [name] on 01/30/20 CRP  WNL, ESR minimally elevated  X-rays of the left foot: suspicious for osteomyelitis -Status post transmetatarsal amputation on 5/14 -seen by ortho on 5/24 with wound vac removed.    Alcohol abuse: Encouraged to continue alcohol cessation  Recurrent PEs on chronic anticoagulation D-dimer negative Patient with history of recurrent PEs on anticoagulation of Coumadin Coumadin per pharmacy  Neuropathy related with chemotherapy, history of Non-Hodgkin's lymphoma Patient with history of neuropathy thought to be related with chemotherapy drugs to treat non-Hodgkin's lymphoma Previously had been treated with systemic chemotherapy of CHOP and Rituxan last given in 11/2011 Continue gabapentin  Anxiety Continue Xanax as needed  Tobacco abuse Continue nicotine patch  Anemia -? ABLA -transfuse for < 7 or symptoms.  Hemoglobin remains stable over 8.  Has not required any transfusion during this admission.  DVT prophylaxis: Coumadin   Code Status: Full Code  Family Communication:  None present at bedside.  Plan of care discussed with patient in length and he verbalized understanding and agreed with it. Patient is from: Home Disposition Plan: Home Barriers to discharge: Patient unwilling to discharge home.  He is waiting for his hands-free clutch to arrive on Friday which he has ordered on [email or web address].  He does not feel like he will be able to navigate in his home using the walker in the meantime.  Eliquis discharging on Friday.  Status is: Inpatient  Remains inpatient appropriate because:Inpatient level of care appropriate due to severity of illness   Dispo: The patient is from: Home              Anticipated d/c is to: Home  Anticipated d/c date is: 2 days              Patient currently is medically stable to d/c.         Estimated body mass index is 27.31 kg/m as calculated from the following:   Height as of this encounter: 5' 9.5" (1.765 m).   Weight as of  this encounter: 85.1 kg.      Nutritional status:               Consultants:   Orthopedics  Procedures:   Left transmetatarsal amputation  Antimicrobials:  Anti-infectives (From admission, onward)   Start     Dose/Rate Route Frequency Ordered Stop   03/21/20 2200  ceFAZolin (ANCEF) IVPB 2g/100 mL premix     2 g 200 mL/hr over 30 Minutes Intravenous Every 6 hours 03/21/20 1709 03/22/20 1126   03/21/20 1545  vancomycin (VANCOCIN) IVPB 1000 mg/200 mL premix     1,000 mg 200 mL/hr over 60 Minutes Intravenous  Once 03/21/20 1543 03/21/20 1553   03/21/20 1544  vancomycin (VANCOCIN) 1-5 GM/200ML-% IVPB    Note to Pharmacy: Cordelia Pen   : cabinet override      03/21/20 1544 03/21/20 1554   03/21/20 1400  ceFAZolin (ANCEF) IVPB 2g/100 mL premix     2 g 200 mL/hr over 30 Minutes Intravenous On call to O.R. 03/21/20 1340 03/21/20 1553         Subjective: Seen and examined.  Pain controlled.  No new complaint.  Objective: Vitals:   04/01/20 1739 04/02/20 0026 04/02/20 0503 04/02/20 1234  BP: 105/71 101/70 105/74 109/77  Pulse: 89 80 77 85  Resp: '18 16 16 16  '$ Temp: 98.6 F (37 C) 97.7 F (36.5 C) 97.8 F (36.6 C) 98 F (36.7 C)  TempSrc: Oral Oral Oral Oral  SpO2: 100% 99% 96% 100%  Weight:      Height:       No intake or output data in the 24 hours ending 04/02/20 1523 Filed Weights   03/30/20 0531 03/31/20 0500 04/01/20 0522  Weight: 87.6 kg 87.6 kg 85.1 kg    Examination:  General exam: Appears calm and comfortable  Respiratory system: Clear to auscultation. Respiratory effort normal. Cardiovascular system: S1 & S2 heard, RRR. No JVD, murmurs, rubs, gallops or clicks. No pedal edema. Gastrointestinal system: Abdomen is nondistended, soft and nontender. No organomegaly or masses felt. Normal bowel sounds heard. Central nervous system: Alert and oriented. No focal neurological deficits. Extremities: Symmetric 5 x 5 power. Skin: Dressing in the  left foot. Psychiatry: Judgement and insight appear normal. Mood & affect appropriate.    Data Reviewed: I have personally reviewed following labs and imaging studies  CBC: Recent Labs  Lab 03/27/20 0558 03/28/20 0251 03/29/20 0239 04/02/20 0511  WBC 3.8* 4.3 4.5 3.2*  NEUTROABS 2.3 2.7 3.0  --   HGB 9.4* 8.8* 8.6* 8.5*  HCT 30.3* 28.3* 27.7* 28.0*  MCV 79.3* 78.4* 78.7* 79.3*  PLT 241 254 290 811*   Basic Metabolic Panel: Recent Labs  Lab 03/27/20 0558 03/28/20 0251 03/29/20 0239 04/02/20 0511  NA 136 136 135 136  K 4.2 4.0 4.0 3.9  CL 101 103 101 102  CO2 '27 26 26 25  '$ GLUCOSE 103* 95 136* 125*  BUN '10 10 13 11  '$ CREATININE 0.75 0.74 0.78 0.78  CALCIUM 9.2 8.9 9.1 9.1   GFR: Estimated Creatinine Clearance: 99.9 mL/min (by C-G formula based on SCr of 0.78  mg/dL). Liver Function Tests: No results for input(s): AST, ALT, ALKPHOS, BILITOT, PROT, ALBUMIN in the last 168 hours. No results for input(s): LIPASE, AMYLASE in the last 168 hours. No results for input(s): AMMONIA in the last 168 hours. Coagulation Profile: Recent Labs  Lab 03/29/20 0239 03/30/20 0501 03/31/20 0506 04/01/20 0548 04/02/20 0511  INR 2.1* 2.1* 2.1* 2.3* 2.4*   Cardiac Enzymes: No results for input(s): CKTOTAL, CKMB, CKMBINDEX, TROPONINI in the last 168 hours. BNP (last 3 results) No results for input(s): PROBNP in the last 8760 hours. HbA1C: No results for input(s): HGBA1C in the last 72 hours. CBG: No results for input(s): GLUCAP in the last 168 hours. Lipid Profile: No results for input(s): CHOL, HDL, LDLCALC, TRIG, CHOLHDL, LDLDIRECT in the last 72 hours. Thyroid Function Tests: No results for input(s): TSH, T4TOTAL, FREET4, T3FREE, THYROIDAB in the last 72 hours. Anemia Panel: Recent Labs    03/31/20 0506  TIBC 300  IRON 42*   Sepsis Labs: No results for input(s): PROCALCITON, LATICACIDVEN in the last 168 hours.  No results found for this or any previous visit (from the  past 240 hour(s)).    Radiology Studies: No results found.  Scheduled Meds: . docusate sodium  100 mg Oral BID  . folic acid  1 mg Oral Daily  . gabapentin  200 mg Oral TID  . metoprolol succinate  12.5 mg Oral Daily  . multivitamin with minerals  1 tablet Oral Daily  . nicotine  21 mg Transdermal Daily  . sodium chloride flush  3 mL Intravenous Once  . sodium chloride flush  3 mL Intravenous Q12H  . thiamine  100 mg Oral Daily  . warfarin  5 mg Oral ONCE-1600  . Warfarin - Pharmacist Dosing Inpatient   Does not apply q1600   Continuous Infusions: . sodium chloride       LOS: 14 days   Time spent: 34 minutes   Darliss Cheney, MD [facility]  04/02/2020, 3:23 PM   To contact the attending provider between 7A-7P or the covering provider during after hours 7P-7A, please log into the web site [email or web address].

## 2020-04-02 NOTE — Progress Notes (Signed)
Dressing to L foot incision changed; moderate dried blood observed to old dressing. No active bleeding observed. Small amount of blackened area observed to posterior of plantar area.

## 2020-04-03 LAB — PROTIME-INR
INR: 2.1 — ABNORMAL HIGH (ref 0.8–1.2)
Prothrombin Time: 22.5 seconds — ABNORMAL HIGH (ref 11.4–15.2)

## 2020-04-03 MED ORDER — WARFARIN SODIUM 7.5 MG PO TABS
7.5000 mg | ORAL_TABLET | Freq: Once | ORAL | Status: AC
  Administered 2020-04-03: 7.5 mg via ORAL
  Filled 2020-04-03: qty 1

## 2020-04-03 NOTE — Progress Notes (Signed)
ANTICOAGULATION CONSULT NOTE - Follow Up Consult  Pharmacy Consult for Warfarin Indication: hx recurrent PE (last 04/2016) and MV repair (2017)  Allergies  Allergen Reactions  . Other Other (See Comments)    "Cactus" caused blisters on tongue (if prepared as food)    Patient Measurements: Height: 5' 9.5" (176.5 cm) Weight: 85.1 kg (187 lb 9.8 oz) IBW/kg (Calculated) : 71.85  Vital Signs: Temp: 99.2 F (37.3 C) (05/27 1300) Temp Source: Oral (05/27 1300) BP: 96/74 (05/27 1300) Pulse Rate: 91 (05/27 1300)  Labs: Recent Labs    04/01/20 0548 04/02/20 0511 04/03/20 0825  HGB  --  8.5*  --   HCT  --  28.0*  --   PLT  --  431*  --   LABPROT 24.2* 25.5* 22.5*  INR 2.3* 2.4* 2.1*  CREATININE  --  0.78  --     Estimated Creatinine Clearance: 99.9 mL/min (by C-G formula based on SCr of 0.78 mg/dL).  Assessment:  59 y/o male with history of  VTE (recurrent PE), MV repair on warfarin 7.5 mg daily PTA with a goal INR of 2-3.  Patient received Vit K 2.5 mg PO on 5/11 for INR >10 on admission. Warfarin was resumed on 5/13.    INR remains therapeutic (2.1), some decrease after dose reduced to 5 mg yesterday.  Had had Warfarin 7.5 mg the x 4 days with INR trend up to 2.4.  Had expected further increase in INR on 7.5 mg.    Previously on warfarin 5 mg daily, then increased to 7.5 mg daily during 3/22 - 4/19 admission.  INRs had seemed stable at discharge, then outpt INR 2.8 on 4/22, then >10 on admit 5/11. Dose 7.5 mg daily likely too high in the long run.    Ortho noted some bleeding with wound VAC removal on 5/24, not unexpected.  No visible bleeding on dressing today.   Fe low 5/24 > Feraheme 510 mg IV x 1 given.  Goal of Therapy:  INR 2-3 Monitor platelets by anticoagulation protocol: Yes   Plan:   Warfarin 7.5 mg x 1 today.  Daily PT/INR.  Arty Baumgartner, [place] Phone: [phone] 04/03/2020,2:26 PM

## 2020-04-03 NOTE — Progress Notes (Signed)
PT Cancellation Note  Patient Details Name: Leonard Bentley MRN: [id number] DOB: 06-19-1961   Cancelled Treatment:    Reason Eval/Treat Not Completed: Other (comment)  Pt's knee crutch has not arrived at hospital , will f/u tomorrow as able for gait training.  Knee crutch has been delivered to pt's home and neighbor is bringing it tonight. Leonard Bentley, PT Acute Rehab Services Pager [phone] [facility] [phone]  Leonard Bentley 04/03/2020, 5:45 PM

## 2020-04-03 NOTE — Progress Notes (Signed)
PROGRESS NOTE    Leonard Bentley  TDD:[id number] DOB: 12-May-1961 DOA: 03/18/2020 PCP: [name], NP   Brief Narrative:   Leonard Bentley is an 59 y.o. male with medical history significant ofHTN,chronic diastolic CHF, severe MRs/pmitral valve repair, recurrentPE, anxiety,non-Hodgkin's lymphoma, osteomyelitis of the left foot, and alcohol abuse presents with complaints of 1 week of chest tightness and shortness of breath.  Patient is status post transmetatarsal amputation for osteomyelitis.  VAC was removed on 5/24.  Per discussion with patient he will have an adequate living situation on Thursday/Friday  Assessment & Plan:   Principal Problem:   Pulmonary edema Active Problems:   Alcohol abuse   Cigarette smoker   Personal history of pulmonary embolism   Neuropathy due to chemotherapeutic drug ([place])   Anxiety   S/P minimally-invasive mitral valve repair   Supratherapeutic INR   Subacute osteomyelitis, left ankle and foot (HCC)   Wound dehiscence, surgical, initial encounter   Microcytic hypochromic anemia   Chest pain  Pulmonary edema/Acute on chronic diastolic HF/History of mitral valve repair BNP WNL, Troponins negative Chest x-ray significant for mild interstitial edema Echo showed EF noted to be 50-55% Strict intake and output, daily weight S/P IV diuresis Follow-up with cardiology as an outpatient  Supratherapeutic INR,R eye conjunctival hemorrhage On admission patient noted to have INR greater than 10 S/p 2.5 g of vitamin K p.o. Daily monitoring of PT/INR (lovenox d/c'd as INR >2) Pharmacy to dose.  INR therapeutic today.  Wound dehiscence of left foot,history of osteomyelitis s/p transmetatarsal amputation with application of wound VAC on 03/21/2020 by Dr. [name] Last fever spike of 100.4 on 03/22/2020, with no leukocytosis Patient has history of osteomyelitis of the left foot for which he just recently had a third ray amputation by Dr. [name] on 01/30/20 CRP  WNL, ESR minimally elevated  X-rays of the left foot: suspicious for osteomyelitis -Status post transmetatarsal amputation on 5/14 -seen by ortho on 5/24 with wound vac removed.    Alcohol abuse: Encouraged to continue alcohol cessation  Recurrent PEs on chronic anticoagulation D-dimer negative Patient with history of recurrent PEs on anticoagulation of Coumadin Coumadin per pharmacy  Neuropathy related with chemotherapy, history of Non-Hodgkin's lymphoma Patient with history of neuropathy thought to be related with chemotherapy drugs to treat non-Hodgkin's lymphoma Previously had been treated with systemic chemotherapy of CHOP and Rituxan last given in 11/2011 Continue gabapentin  Anxiety Continue Xanax as needed  Tobacco abuse Continue nicotine patch  Anemia -? ABLA -transfuse for < 7 or symptoms.  Hemoglobin remains stable over 8.  Has not required any transfusion during this admission.  DVT prophylaxis: Coumadin   Code Status: Full Code  Family Communication:  None present at bedside.  Plan of care discussed with patient in length and he verbalized understanding and agreed with it. Patient is from: Home Disposition Plan: Home Barriers to discharge: Patient unwilling to discharge home.  He is waiting for his hands-free clutch to arrive on Friday which he has ordered on [email or web address].  He does not feel like he will be able to navigate in his home using the walker in the meantime.  He plans to go home tomorrow.  Status is: Inpatient  Remains inpatient appropriate because:Inpatient level of care appropriate due to severity of illness   Dispo: The patient is from: Home              Anticipated d/c is to: Home  Anticipated d/c date is: 1 [place]              Patient currently is medically stable to d/c.         Estimated body mass index is 27.31 kg/m as calculated from the following:   Height as of this encounter: 5' 9.5" (1.765 m).   Weight as of this  encounter: 85.1 kg.      Nutritional status:               Consultants:   Orthopedics  Procedures:   Left transmetatarsal amputation  Antimicrobials:  Anti-infectives (From admission, onward)   Start     Dose/Rate Route Frequency Ordered Stop   03/21/20 2200  ceFAZolin (ANCEF) IVPB 2g/100 mL premix     2 g 200 mL/hr over 30 Minutes Intravenous Every 6 hours 03/21/20 1709 03/22/20 1126   03/21/20 1545  vancomycin (VANCOCIN) IVPB 1000 mg/200 mL premix     1,000 mg 200 mL/hr over 60 Minutes Intravenous  Once 03/21/20 1543 03/21/20 1553   03/21/20 1544  vancomycin (VANCOCIN) 1-5 GM/200ML-% IVPB    Note to Pharmacy: Cordelia Pen   : cabinet override      03/21/20 1544 03/21/20 1554   03/21/20 1400  ceFAZolin (ANCEF) IVPB 2g/100 mL premix     2 g 200 mL/hr over 30 Minutes Intravenous On call to O.R. 03/21/20 1340 03/21/20 1553         Subjective: Seen and examined.  Feels well.  No complaints.  Objective: Vitals:   04/02/20 1234 04/02/20 1811 04/03/20 0049 04/03/20 0454  BP: 109/77 100/75 100/67 97/72  Pulse: 85 95 96 85  Resp: '16 16 16 18  '$ Temp: 98 F (36.7 C) 98 F (36.7 C) 98 F (36.7 C) 98.3 F (36.8 C)  TempSrc: Oral Oral Oral Oral  SpO2: 100% 98% 98% 97%  Weight:      Height:       No intake or output data in the 24 hours ending 04/03/20 1123 Filed Weights   03/30/20 0531 03/31/20 0500 04/01/20 0522  Weight: 87.6 kg 87.6 kg 85.1 kg    Examination:  General exam: Appears calm and comfortable  Respiratory system: Clear to auscultation. Respiratory effort normal. Cardiovascular system: S1 & S2 heard, RRR. No JVD, murmurs, rubs, gallops or clicks. No pedal edema. Gastrointestinal system: Abdomen is nondistended, soft and nontender. No organomegaly or masses felt. Normal bowel sounds heard. Central nervous system: Alert and oriented. No focal neurological deficits. Extremities: Symmetric 5 x 5 power. Skin: Dressing in the left  foot. Psychiatry: Judgement and insight appear normal. Mood & affect appropriate.     Data Reviewed: I have personally reviewed following labs and imaging studies  CBC: Recent Labs  Lab 03/28/20 0251 03/29/20 0239 04/02/20 0511  WBC 4.3 4.5 3.2*  NEUTROABS 2.7 3.0  --   HGB 8.8* 8.6* 8.5*  HCT 28.3* 27.7* 28.0*  MCV 78.4* 78.7* 79.3*  PLT 254 290 937*   Basic Metabolic Panel: Recent Labs  Lab 03/28/20 0251 03/29/20 0239 04/02/20 0511  NA 136 135 136  K 4.0 4.0 3.9  CL 103 101 102  CO2 '26 26 25  '$ GLUCOSE 95 136* 125*  BUN '10 13 11  '$ CREATININE 0.74 0.78 0.78  CALCIUM 8.9 9.1 9.1   GFR: Estimated Creatinine Clearance: 99.9 mL/min (by C-G formula based on SCr of 0.78 mg/dL). Liver Function Tests: No results for input(s): AST, ALT, ALKPHOS, BILITOT, PROT, ALBUMIN in the last 168  hours. No results for input(s): LIPASE, AMYLASE in the last 168 hours. No results for input(s): AMMONIA in the last 168 hours. Coagulation Profile: Recent Labs  Lab 03/30/20 0501 03/31/20 0506 04/01/20 0548 04/02/20 0511 04/03/20 0825  INR 2.1* 2.1* 2.3* 2.4* 2.1*   Cardiac Enzymes: No results for input(s): CKTOTAL, CKMB, CKMBINDEX, TROPONINI in the last 168 hours. BNP (last 3 results) No results for input(s): PROBNP in the last 8760 hours. HbA1C: No results for input(s): HGBA1C in the last 72 hours. CBG: No results for input(s): GLUCAP in the last 168 hours. Lipid Profile: No results for input(s): CHOL, HDL, LDLCALC, TRIG, CHOLHDL, LDLDIRECT in the last 72 hours. Thyroid Function Tests: No results for input(s): TSH, T4TOTAL, FREET4, T3FREE, THYROIDAB in the last 72 hours. Anemia Panel: No results for input(s): VITAMINB12, FOLATE, FERRITIN, TIBC, IRON, RETICCTPCT in the last 72 hours. Sepsis Labs: No results for input(s): PROCALCITON, LATICACIDVEN in the last 168 hours.  No results found for this or any previous visit (from the past 240 hour(s)).    Radiology Studies: No  results found.  Scheduled Meds: . docusate sodium  100 mg Oral BID  . folic acid  1 mg Oral Daily  . gabapentin  200 mg Oral TID  . metoprolol succinate  12.5 mg Oral Daily  . multivitamin with minerals  1 tablet Oral Daily  . nicotine  21 mg Transdermal Daily  . sodium chloride flush  3 mL Intravenous Once  . sodium chloride flush  3 mL Intravenous Q12H  . thiamine  100 mg Oral Daily  . Warfarin - Pharmacist Dosing Inpatient   Does not apply q1600   Continuous Infusions: . sodium chloride       LOS: 15 days   Time spent: 26 minutes   Darliss Cheney, MD [facility]  04/03/2020, 11:23 AM   To contact the attending provider between 7A-7P or the covering provider during after hours 7P-7A, please log into the web site [email or web address].

## 2020-04-03 NOTE — Progress Notes (Signed)
Occupational Therapy Treatment Patient Details Name: Leonard Bentley MRN: [id number] DOB: 1961-10-06 Today's Date: 04/03/2020    History of present illness Pt is a 59 y/o male admitted secondary to chest tightness. Pt found to have pulmonary edema with history of diastolic congestive heart.  Underwent Transmetatarsal amputation left foot with application Prevena wound VAC on 5/14, removed 5/24; PMH including but not limited to HTN, chronic diastolic CHF, severe MR s/p mitral valve repair, recurrent PE, anxiety, non-Hodgkin's lymphoma, osteomyelitis of the left foot, and alcohol abuse.   OT comments  Patient continues to make steady progress towards goals in skilled OT session. Patient's session encompassed functional mobility, education on adaptive equipment, and demonstration of ADL tasks. Pt with increased ability to ambulate and appropriately adhere to NWB status, however requires increased cues to take rest breaks as pt is extremely determined to get to PLOF. Reiterated the dangers of doing "too much too fast" and having a set back with a fall and provided education with regard to energy conservation and adaptive equipment. Discharge remains appropriate at the this time, will continue to follow acutely.    Follow Up Recommendations  Home health OT;Supervision - Intermittent    Equipment Recommendations  Tub/shower seat    Recommendations for Other Services      Precautions / Restrictions Precautions Precautions: Fall Precaution Comments: NWB L LE Restrictions Weight Bearing Restrictions: Yes LLE Weight Bearing: Non weight bearing Other Position/Activity Restrictions: Strict NWB per Ortho note; post-op shoe donned       Mobility Bed Mobility               General bed mobility comments: Received in standing about to ambulate in hallway  Transfers Overall transfer level: Needs assistance Equipment used: Rolling walker (2 wheeled) Transfers: Sit to/from Stand Sit to Stand:  Supervision         General transfer comment: Safety    Balance Overall balance assessment: Needs assistance Sitting-balance support: No upper extremity supported;Feet supported Sitting balance-Leahy Scale: Normal     Standing balance support: During functional activity Standing balance-Leahy Scale: Poor Standing balance comment: reliant on external support in standing, especially dynamically                           ADL either performed or assessed with clinical judgement   ADL Overall ADL's : Needs assistance/impaired     Grooming: Wash/dry hands;Wash/dry face;Supervision/safety;Standing                   Toilet Transfer: Chief of Staff Details (indicate cue type and reason): Simulated with chair         Functional mobility during ADLs: Min guard;Cueing for safety;Rolling walker;Supervision/safety General ADL Comments: Pt conitnues to make strides in overall mobility and ADLs, pts greatest limiting factor now is not attempting to do too much too quickly     Vision       Perception     Praxis      Cognition Arousal/Alertness: Awake/alert Behavior During Therapy: WFL for tasks assessed/performed Overall Cognitive Status: Within Functional Limits for tasks assessed                                 General Comments: Pt continues to push self and needs cues to self monitor activity        Exercises     Shoulder Instructions  General Comments      Pertinent Vitals/ Pain       Pain Assessment: Faces Faces Pain Scale: Hurts little more Pain Location: pain at operative site Pain Descriptors / Indicators: Aching;Burning;Discomfort;Heaviness Pain Intervention(s): Limited activity within patient's tolerance;Monitored during session;Repositioned  Home Living                                          Prior Functioning/Environment              Frequency  Min 2X/week         Progress Toward Goals  OT Goals(current goals can now be found in the care plan section)  Progress towards OT goals: Progressing toward goals  Acute Rehab OT Goals Patient Stated Goal: to get stronger before going home OT Goal Formulation: With patient Time For Goal Achievement: 04/05/20 Potential to Achieve Goals: Good  Plan Discharge plan remains appropriate    Co-evaluation                 AM-PAC OT "6 Clicks" Daily Activity     Outcome Measure   Help from another person eating meals?: None Help from another person taking care of personal grooming?: None Help from another person toileting, which includes using toliet, bedpan, or urinal?: A Little Help from another person bathing (including washing, rinsing, drying)?: A Little Help from another person to put on and taking off regular upper body clothing?: None Help from another person to put on and taking off regular lower body clothing?: A Little 6 Click Score: 21    End of Session Equipment Utilized During Treatment: Rolling walker;Other (comment)(Post Op shoe)  OT Visit Diagnosis: Other abnormalities of gait and mobility (R26.89);Unsteadiness on feet (R26.81);Pain Pain - Right/Left: Left Pain - part of body: Ankle and joints of foot   Activity Tolerance Patient tolerated treatment well   Patient Left in bed;with call bell/phone within reach   Nurse Communication Mobility status        Time: [id number] OT Time Calculation (min): 26 min  Charges: OT General Charges $OT Visit: 1 Visit OT Treatments $Self Care/Home Management : 23-37 mins  [place]. Yvana Samonte, COTA/L Acute Rehabilitation Services [place] 04/03/2020, 2:14 PM

## 2020-04-04 LAB — CBC WITH DIFFERENTIAL/PLATELET
Abs Immature Granulocytes: 0.01 10*3/uL (ref 0.00–0.07)
Basophils Absolute: 0.1 10*3/uL (ref 0.0–0.1)
Basophils Relative: 1 %
Eosinophils Absolute: 0.3 10*3/uL (ref 0.0–0.5)
Eosinophils Relative: 7 %
HCT: 29.9 % — ABNORMAL LOW (ref 39.0–52.0)
Hemoglobin: 9.2 g/dL — ABNORMAL LOW (ref 13.0–17.0)
Immature Granulocytes: 0 %
Lymphocytes Relative: 10 %
Lymphs Abs: 0.5 10*3/uL — ABNORMAL LOW (ref 0.7–4.0)
MCH: 24.5 pg — ABNORMAL LOW (ref 26.0–34.0)
MCHC: 30.8 g/dL (ref 30.0–36.0)
MCV: 79.5 fL — ABNORMAL LOW (ref 80.0–100.0)
Monocytes Absolute: 0.7 10*3/uL (ref 0.1–1.0)
Monocytes Relative: 16 %
Neutro Abs: 2.9 10*3/uL (ref 1.7–7.7)
Neutrophils Relative %: 66 %
Platelets: 484 10*3/uL — ABNORMAL HIGH (ref 150–400)
RBC: 3.76 MIL/uL — ABNORMAL LOW (ref 4.22–5.81)
RDW: 14.2 % (ref 11.5–15.5)
WBC: 4.4 10*3/uL (ref 4.0–10.5)
nRBC: 0 % (ref 0.0–0.2)

## 2020-04-04 LAB — BASIC METABOLIC PANEL
Anion gap: 6 (ref 5–15)
BUN: 15 mg/dL (ref 6–20)
CO2: 26 mmol/L (ref 22–32)
Calcium: 9.4 mg/dL (ref 8.9–10.3)
Chloride: 104 mmol/L (ref 98–111)
Creatinine, Ser: 0.9 mg/dL (ref 0.61–1.24)
GFR calc Af Amer: >60 mL/min (ref >60)
GFR calc non Af Amer: >60 mL/min (ref >60)
Glucose, Bld: 105 mg/dL — ABNORMAL HIGH (ref 70–99)
Potassium: 4.1 mmol/L (ref 3.5–5.1)
Sodium: 136 mmol/L (ref 135–145)

## 2020-04-04 LAB — PROTIME-INR
INR: 2 — ABNORMAL HIGH (ref 0.8–1.2)
Prothrombin Time: 22 seconds — ABNORMAL HIGH (ref 11.4–15.2)

## 2020-04-04 MED ORDER — WARFARIN SODIUM 7.5 MG PO TABS: 7.5000 mg | ORAL_TABLET | Freq: Once | ORAL | 0 refills | Status: DC

## 2020-04-04 MED ORDER — METOPROLOL SUCCINATE ER 25 MG PO TB24: 12.5000 mg | ORAL_TABLET | Freq: Every day | ORAL | 0 refills | Status: DC

## 2020-04-04 MED ORDER — OXYCODONE HCL 5 MG PO TABS: 5.0000 mg | ORAL_TABLET | Freq: Four times a day (QID) | ORAL | 0 refills | Status: DC | PRN

## 2020-04-04 MED ORDER — ALPRAZOLAM 0.25 MG PO TABS: 0.2500 mg | ORAL_TABLET | Freq: Two times a day (BID) | ORAL | 0 refills | Status: DC | PRN

## 2020-04-04 MED ORDER — GABAPENTIN 100 MG PO CAPS: 200.0000 mg | ORAL_CAPSULE | Freq: Three times a day (TID) | ORAL | 0 refills | Status: DC

## 2020-04-04 MED FILL — WARFARIN SODIUM 7.5 MG TAB: 7.5 | 30 days supply | Qty: 30 | Fill #0

## 2020-04-04 MED FILL — METOPROLOL SUCCINATE ER 25: 25 | 30 days supply | Qty: 15 | Fill #0

## 2020-04-04 MED FILL — GABAPENTIN 100 MG CAPSULE: 100 | 30 days supply | Qty: 180 | Fill #0

## 2020-04-04 MED FILL — oxyCODONE HCL 5 MG TABS: 5 | 2 days supply | Qty: 10 | Fill #0

## 2020-04-04 MED FILL — ALPRAZolam 0.25 MG TABS: 0.25 | 5 days supply | Qty: 10 | Fill #0

## 2020-04-04 NOTE — Progress Notes (Signed)
Orthopedic Tech Progress Note Patient Details:  Leonard Bentley 11/07/1961 [id number] Patient needed a new POST OP SHOE Ortho Devices Type of Ortho Device: Postop shoe/boot Ortho Device/Splint Location: LUE Ortho Device/Splint Interventions: Application, Adjustment   Post Interventions Patient Tolerated: Well Instructions Provided: Care of device, Poper ambulation with device   Janit Pagan 04/04/2020, 11:00 AM

## 2020-04-04 NOTE — Progress Notes (Signed)
Physical Therapy Treatment Patient Details Name: Leonard Bentley MRN: [id number] DOB: 19-Dec-1960 Today's Date: 04/04/2020    History of Present Illness Pt is a 59 y/o male admitted secondary to chest tightness. Pt found to have pulmonary edema with history of diastolic congestive heart.  Underwent Transmetatarsal amputation left foot with application Prevena wound VAC on 5/14, removed 5/24; PMH including but not limited to HTN, chronic diastolic CHF, severe MR s/p mitral valve repair, recurrent PE, anxiety, non-Hodgkin's lymphoma, osteomyelitis of the left foot, and alcohol abuse.    PT Comments    Pt received the Iwalk knee crutch that he ordered to allow weight bearing on L knee with gait.  Session focused on AD fitting, safe transfers and gait with device.  Pt able to increase gait distance and safety but still needs RW and cues for self monitoring.  Pt needs further gait/stair training and d/c planning barrier discussion.  Will f/u at later time today (limited now due to PT had appointment with another pt at end of session).    Follow Up Recommendations  Home health PT;Supervision - Intermittent     Equipment Recommendations  Rolling walker with 5" wheels    Recommendations for Other Services       Precautions / Restrictions Precautions Precautions: Fall Precaution Comments: NWB L LE Restrictions LLE Weight Bearing: Non weight bearing Other Position/Activity Restrictions: Strict NWB per Ortho note; post-op shoe donned    Mobility  Bed Mobility Overal bed mobility: Independent       Supine to sit: Independent Sit to supine: Independent      Transfers Overall transfer level: Needs assistance Equipment used: Rolling walker (2 wheeled) Transfers: Sit to/from [facility] Sit to Stand: Modified independent (Device/Increase time)         General transfer comment: Safety; cues for RW use; pt wanting to progress to cane but unable to do safely; performed  multiple sit to stands with RW and knee crutch; cues to extend L LE with knee crutch to prevent weight on L foot with sitting  Ambulation/Gait Ambulation/Gait assistance: Supervision;Min guard Gait Distance (Feet): 200 Feet(x2) Assistive device: Rolling walker (2 wheeled);Straight cane Gait Pattern/deviations: Step-through pattern     General Gait Details: Pt has received the knee crutch that he ordered and performed gait training today.  Required multiple adjustments to get straps correct length and length correct to prevent catching the "toe" of the device during swing phase.  Pt cued for posture and keeping RW close.  Able to progress to step through gait with good contact of foot plate on knee crutch.  Pt attempted with cane but unsafe and PT strongly recommends RW - pt agreeable.   Stairs             Wheelchair Mobility    Modified Rankin (Stroke Patients Only)       Balance Overall balance assessment: Needs assistance Sitting-balance support: No upper extremity supported;Feet supported Sitting balance-Leahy Scale: Normal     Standing balance support: During functional activity;Single extremity supported Standing balance-Leahy Scale: Fair Standing balance comment: Required at least single UE support                            Cognition Arousal/Alertness: Awake/alert Behavior During Therapy: WFL for tasks assessed/performed Overall Cognitive Status: Within Functional Limits for tasks assessed  General Comments: Pt continues to push self and needs cues to self monitor activity      Exercises      General Comments General comments (skin integrity, edema, etc.): Assisted pt in knee crutch fitting and adjustments.  Educated on safe transfers and continuing to self monitor acitivity.      Pertinent Vitals/Pain Pain Assessment: No/denies pain    Home Living                      Prior Function             PT Goals (current goals can now be found in the care plan section) Acute Rehab PT Goals Patient Stated Goal: to get stronger before going home PT Goal Formulation: With patient Time For Goal Achievement: 04/05/20 Potential to Achieve Goals: Good Progress towards PT goals: Progressing toward goals    Frequency    Min 3X/week      PT Plan Current plan remains appropriate    Co-evaluation              AM-PAC PT "6 Clicks" Mobility   Outcome Measure  Help needed turning from your back to your side while in a flat bed without using bedrails?: None Help needed moving from lying on your back to sitting on the side of a flat bed without using bedrails?: None Help needed moving to and from a bed to a chair (including a wheelchair)?: None Help needed standing up from a chair using your arms (e.g., wheelchair or bedside chair)?: None Help needed to walk in hospital room?: None Help needed climbing 3-5 steps with a railing? : A Little 6 Click Score: 23    End of Session Equipment Utilized During Treatment: Gait belt Activity Tolerance: Patient tolerated treatment well Patient left: in bed;with call bell/phone within reach Nurse Communication: Mobility status PT Visit Diagnosis: Other abnormalities of gait and mobility (R26.89)     Time: [id number] PT Time Calculation (min) (ACUTE ONLY): 30 min  Charges:  $Gait Training: 8-22 mins $Therapeutic Activity: 8-22 mins                     Maggie Font, PT Acute Rehab Services Pager [phone] [facility] [phone] [facility] [place] 04/04/2020, 2:02 PM

## 2020-04-04 NOTE — Discharge Summary (Addendum)
Physician Discharge Summary  Leonard Bentley [id number] DOB: [birth year] DOA: 03/18/2020  PCP: [name], NP  Admit date: 03/18/2020 Discharge date: 04/04/2020  Admitted From: Home Disposition: Home  Recommendations for Outpatient Follow-up:  1. Follow up with PCP in 1-2 weeks 2. Please obtain BMP/CBC in one week 3. Please follow up on the following pending results:  Home Health: Yes Equipment/Devices: Hands-free clutch  Discharge Condition: Stable CODE STATUS: Full code Diet recommendation: Cardiac  Subjective: Seen and examined.  No complaints.  Brief/Interim Summary: Leonard Bentley an 59 y.o.malewith medical history significant ofHTN,chronic diastolic CHF, severe MRs/pmitral valve repair, recurrentPE, anxiety,non-Hodgkin's lymphoma, osteomyelitis of the left foot, and alcohol abuse presented with complaints of 1 week of chest tightness and shortness of breath.  Chest x-ray significant for mild interstitial edema.  Slightly tachycardic upon presentation.  He was diagnosed with acute on chronic diastolic congestive heart failure.  Patient received 1 dose of IV Lasix 40 mg and was admitted to hospital service.  He also had supratherapeutic INR with greater than 10. He received 2.5 mg vitamin K.  He also had subconjunctival hemorrhage in the right eye.  He had wound dehiscence of the left foot with a history of osteomyelitis.  Patient just recently had a third ray amputation by Dr. [name] on 3/24. The wound had been closed and he had been wearing compression stockings as advised. However noted increasing pain at the left foot.  Dr. [name] from orthopedics was consulted and patient underwent transmetatarsal amputation for osteomyelitis on 03/21/2020.  Wound VAC was attached andVAC was removed on 5/24.  He is getting daily wound care/dressing changes.  He was cleared from orthopedics 4 days ago however patient claimed that he did not have safe living situation and he could not get around  at home with the help of walker so he ordered hands-free clutch himself from [place] which took 4 days to deliver.  It has finally been delivered to his house last night and currently in his room.  He is now willing to go home.  He is being discharged in stable condition.  Per patient's request, he is being discharged on 10 tablets of Roxicodone and 10 tablets of Xanax.  Discharge Diagnoses:  Principal Problem:   Pulmonary edema Active Problems:   Alcohol abuse   Cigarette smoker   Personal history of pulmonary embolism   Neuropathy due to chemotherapeutic drug ([place])   Anxiety   S/P minimally-invasive mitral valve repair   Supratherapeutic INR   Subacute osteomyelitis, left ankle and foot (HCC)   Wound dehiscence, surgical, initial encounter   Microcytic hypochromic anemia   Chest pain    Discharge Instructions   Allergies as of 04/04/2020      Reactions   Other Other (See Comments)   "Cactus" caused blisters on tongue (if prepared as food)      Medication List    STOP taking these medications   docusate sodium 100 MG capsule Commonly known as: COLACE     TAKE these medications   acetaminophen 325 MG tablet Commonly known as: TYLENOL Take 2 tablets (650 mg total) by mouth every 6 (six) hours as needed for mild pain (or Fever >/= 101).   ALPRAZolam 0.25 MG tablet Commonly known as: XANAX Take 1 tablet (0.25 mg total) by mouth 2 (two) times daily as needed for anxiety.   gabapentin 100 MG capsule Commonly known as: NEURONTIN Take 2 capsules (200 mg total) by mouth 3 (three) times daily. What changed:  how much to take   metoprolol succinate 25 MG 24 hr tablet Commonly known as: TOPROL-XL Take 0.5 tablets (12.5 mg total) by mouth daily. What changed:   medication strength  how much to take  additional instructions   nicotine 21 mg/24hr patch Commonly known as: NICODERM CQ - dosed in mg/24 hours Place 1 patch (21 mg total) onto the skin daily.   oxyCODONE  5 MG immediate release tablet Commonly known as: Oxy IR/ROXICODONE Take 1 tablet (5 mg total) by mouth every 6 (six) hours as needed for moderate pain (pain score 4-6).   warfarin 7.5 MG tablet Commonly known as: COUMADIN Take as directed. If you are unsure how to take this medication, talk to your nurse or doctor. Original instructions: Take 1 tablet (7.5 mg total) by mouth one time only at 4 PM for 30 doses. Take 1 tablet by mouth daily. [facility]up with [facility] for INR and dosing instructions What changed:   how much to take  how to take this  when to take this            Durable Medical Equipment  (From admission, onward)         Start     Ordered   03/24/20 1405  For home use only DME lightweight manual wheelchair with seat cushion  Once    Comments: Patient suffers from Transmetatarsal amputation left foot with application Prevena wound VAC.which impairs their ability to perform daily activities like ambulating  in the home.  A cane  will not resolve  issue with performing activities of daily living. A wheelchair will allow patient to safely perform daily activities. Patient is not able to propel themselves in the home using a standard weight wheelchair due to Transmetatarsal amputation left foot with application Prevena wound VAC. Patient can self propel in the lightweight wheelchair. Length of need lifetime  . Accessories: elevating leg rests (ELRs), wheel locks, extensions and anti-tippers.  Elevating leg rests   Seat and back cushions   03/24/20 1406   03/24/20 1400  For home use only DME Shower stool  Once    Comments: shower seat for stair negotiation   03/24/20 1400         Follow-up [place]. Schedule an appointment as soon as possible for a visit.   Contact information: [place] [phone]       Persons, Bevely Palmer, [place] In 1 week.   Specialty:  Orthopedic Surgery Contact information: [place] [address] [phone]        [name], NP Follow up in 1 week(s).   Specialty: Nurse Practitioner Contact information: [place] [address] [phone]        [name], MD .   Specialty: Cardiology Contact information: [address] [place] [address] [place] [address] [phone]          Allergies  Allergen Reactions  . Other Other (See Comments)    "Cactus" caused blisters on tongue (if prepared as food)    Consultations: Orthopedics   Procedures/Studies: DG Chest 2 View  Result Date: 03/18/2020 CLINICAL DATA:  Chest pain for 2 days, intermittent nausea, weakness EXAM: CHEST - 2 VIEW COMPARISON:  01/27/2020 FINDINGS: Frontal and lateral views of the chest demonstrates stable mitral valve annuloplasty. Cardiac silhouette is stable. Increased interstitial prominence and central vascular congestion compatible with interstitial edema. No effusion or pneumothorax.  No acute bony abnormalities. IMPRESSION: 1. Mild interstitial edema. Electronically Signed   By: Randa Ngo M.D.   On: 03/18/2020 02:19   DG Foot 2 Views Left  Result Date: 03/18/2020 CLINICAL DATA:  Third through fifth toe amputation, severe swelling of LEFT foot. EXAM: LEFT FOOT - 2 VIEW COMPARISON:  03/10/2020 FINDINGS: Partial amputation of the third through the fifth metatarsal similar to the previous exam. Signs of periosteal reaction along the distal aspect of the fifth metatarsal mid shaft at the site of amputation with adjacent bone formation and soft tissue swelling/edema. Osteopenia elsewhere soft tissue swelling is certainly worse when compared to March of 2021 and worse along the plantar aspect of the foot compared to the most recent study. IMPRESSION: 1. Signs of periosteal reaction along the distal aspect of the fifth metatarsal mid shaft with adjacent bone formation and soft tissue  swelling/edema. Findings are suspicious for osteomyelitis, MRI may be helpful as clinically warranted for further assessment. 2. Worsening generalized soft tissue swelling elsewhere, particularly along the plantar aspect of the foot. Electronically Signed   By: Zetta Bills M.D.   On: 03/18/2020 11:06   ECHOCARDIOGRAM COMPLETE  Result Date: 03/18/2020    ECHOCARDIOGRAM REPORT   Patient Name:   Leonard Bentley Date of Exam: 03/18/2020 Medical Rec #:  [id number]   Height:       69.0 in Accession #:    [id number]  Weight:       193.0 lb Date of Birth:  09-22-1961   BSA:          2.035 m Patient Age:    89 years    BP:           129/93 mmHg Patient Gender: M           HR:           100 bpm. Exam Location:  Inpatient Procedure: 2D Echo, Cardiac Doppler and Color Doppler Indications:    CHF-Acute Diastolic [id number] / [id number]  History:        Patient has prior history of Echocardiogram examinations, most                 recent 08/31/2018. CHF, Mitral Valve Disease; Risk                 Factors:Current Smoker and Hypertension. Pulmonary embolism.                 SVT.                  Mitral Valve: prosthetic annuloplasty ring valve is present in                 the mitral position. Procedure Date: 10/27/2016.  Sonographer:    Vickie Epley RDCS Referring Phys: [id number] RONDELL A SMITH IMPRESSIONS  1. Left ventricular ejection fraction, by estimation, is 50 to 55%. The left ventricle has low normal function. The left ventricle has no regional wall motion abnormalities. Left ventricular diastolic parameters are consistent with Grade I diastolic dysfunction (impaired relaxation).  2. Right ventricular systolic function is normal. The right ventricular size is normal. Tricuspid regurgitation signal is inadequate for assessing PA pressure.  3. Left atrial size was mildly dilated.  4. The mitral valve has been repaired/replaced. Trivial mitral valve regurgitation. No evidence of mitral stenosis. The mean mitral valve gradient is 1.9  mmHg with average heart rate of 97 bpm. There is a prosthetic annuloplasty ring present in the mitral  position. Procedure Date: 10/27/2016.  5. The aortic valve is normal in structure. Aortic valve regurgitation is not visualized.  6. The inferior vena cava is normal in size with greater than 50% respiratory variability, suggesting right atrial pressure of 3 mmHg. Comparison(s): No significant change from prior study. Prior images reviewed side by side. FINDINGS  Left Ventricle: Left ventricular ejection fraction, by estimation, is 50 to 55%. The left ventricle has low normal function. The left ventricle has no regional wall motion abnormalities. The left ventricular internal cavity size was normal in size. There is no left ventricular hypertrophy. Abnormal (paradoxical) septal motion consistent with post-operative status. Left ventricular diastolic function could not be evaluated due to mitral valve repair. Left ventricular diastolic parameters are consistent with Grade I diastolic dysfunction (impaired relaxation). Normal left ventricular filling pressure. Right Ventricle: The right ventricular size is normal. No increase in right ventricular wall thickness. Right ventricular systolic function is normal. Tricuspid regurgitation signal is inadequate for assessing PA pressure. Left Atrium: Left atrial size was mildly dilated. Right Atrium: Right atrial size was normal in size. Pericardium: There is no evidence of pericardial effusion. Mitral Valve: Neochordae and annuloplasty ring are seen. The mitral valve has been repaired/replaced. Trivial mitral valve regurgitation. There is a prosthetic annuloplasty ring present in the mitral position. Procedure Date: 10/27/2016. No evidence of mitral valve stenosis. The mean mitral valve gradient is 1.9 mmHg with average heart rate of 97 bpm. Tricuspid Valve: The tricuspid valve is normal in structure. Tricuspid valve regurgitation is not demonstrated. Aortic Valve: The aortic  valve is normal in structure. Aortic valve regurgitation is not visualized. Pulmonic Valve: The pulmonic valve was normal in structure. Pulmonic valve regurgitation is trivial. Aorta: The aortic root and ascending aorta are structurally normal, with no evidence of dilitation. Venous: The inferior vena cava is normal in size with greater than 50% respiratory variability, suggesting right atrial pressure of 3 mmHg. IAS/Shunts: No atrial level shunt detected by color flow Doppler.  LEFT VENTRICLE PLAX 2D LVIDd:         5.00 cm  Diastology LVIDs:         3.50 cm  LV e' lateral:   8.03 cm/s LV PW:         1.00 cm  LV E/e' lateral: 7.4 LV IVS:        1.00 cm  LV e' medial:    5.27 cm/s LVOT diam:     2.70 cm  LV E/e' medial:  11.3 LV SV:         62 LV SV Index:   30 LVOT Area:     5.73 cm  RIGHT VENTRICLE RV S prime:     7.23 cm/s TAPSE (M-mode): 1.3 cm LEFT ATRIUM             Index       RIGHT ATRIUM           Index LA diam:        4.00 cm 1.97 cm/m  RA Area:     12.50 cm LA Vol (A2C):   53.7 ml 26.39 ml/m RA Volume:   20.10 ml  9.88 ml/m LA Vol (A4C):   49.4 ml 24.28 ml/m LA Biplane Vol: 52.1 ml 25.60 ml/m  AORTIC VALVE LVOT Vmax:   67.20 cm/s LVOT Vmean:  46.500 cm/s LVOT VTI:    0.108 m  AORTA Ao Root diam: 3.90 cm Ao Asc diam:  3.70 cm MITRAL VALVE MV Area (PHT): 3.50 cm  SHUNTS MV Mean grad:  1.9 mmHg    Systemic VTI:  0.11 m MV Decel Time: 217 msec    Systemic Diam: 2.70 cm MV E velocity: 59.70 cm/s MV A velocity: 97.50 cm/s MV E/A ratio:  0.61 Mihai Croitoru MD Electronically signed by Sanda Klein MD Signature Date/Time: 03/18/2020/3:07:19 PM    Final    XR Finger Middle Left  Result Date: 03/10/2020 2 view radiographs of the right long finger shows no bony abnormalities.  XR Foot 2 Views Left  Result Date: 03/10/2020 2 view radiographs of the left foot shows previous first second and third ray amputations with no lytic bony changes no radiographic signs of osteomyelitis.    Discharge  Exam: Vitals:   04/04/20 0555 04/04/20 0900  BP: 115/86 96/74  Pulse: 87 98  Resp: 18   Temp: 97.7 F (36.5 C) 97.7 F (36.5 C)  SpO2: 100% 100%   Vitals:   04/03/20 1733 04/04/20 0029 04/04/20 0555 04/04/20 0900  BP: 102/78 118/88 115/86 96/74  Pulse: 90 90 87 98  Resp: 18 18 18    Temp: 98.5 F (36.9 C) 98 F (36.7 C) 97.7 F (36.5 C) 97.7 F (36.5 C)  TempSrc: Oral Oral Oral Axillary  SpO2: 100% 98% 100% 100%  Weight:   84.8 kg   Height:        General: Pt is alert, awake, not in acute distress Cardiovascular: RRR, S1/S2 +, no rubs, no gallops Respiratory: CTA bilaterally, no wheezing, no rhonchi Abdominal: Soft, NT, ND, bowel sounds + Extremities: no edema, no cyanosis.  Dressing in left foot.    The results of significant diagnostics from this hospitalization (including imaging, microbiology, ancillary and laboratory) are listed below for reference.     Microbiology: No results found for this or any previous visit (from the past 240 hour(s)).   Labs: BNP (last 3 results) Recent Labs    03/18/20 0207  BNP [id number]   Basic Metabolic Panel: Recent Labs  Lab 03/29/20 0239 04/02/20 0511 04/04/20 0613  NA 135 136 136  K 4.0 3.9 4.1  CL 101 102 104  CO2 26 25 26   GLUCOSE 136* 125* 105*  BUN 13 11 15   CREATININE 0.78 0.78 0.90  CALCIUM 9.1 9.1 9.4   Liver Function Tests: No results for input(s): AST, ALT, ALKPHOS, BILITOT, PROT, ALBUMIN in the last 168 hours. No results for input(s): LIPASE, AMYLASE in the last 168 hours. No results for input(s): AMMONIA in the last 168 hours. CBC: Recent Labs  Lab 03/29/20 0239 04/02/20 0511 04/04/20 0613  WBC 4.5 3.2* 4.4  NEUTROABS 3.0  --  2.9  HGB 8.6* 8.5* 9.2*  HCT 27.7* 28.0* 29.9*  MCV 78.7* 79.3* 79.5*  PLT 290 431* 484*   Cardiac Enzymes: No results for input(s): CKTOTAL, CKMB, CKMBINDEX, TROPONINI in the last 168 hours. BNP: Invalid input(s): POCBNP CBG: No results for input(s): GLUCAP in the  last 168 hours. D-Dimer No results for input(s): DDIMER in the last 72 hours. Hgb A1c No results for input(s): HGBA1C in the last 72 hours. Lipid Profile No results for input(s): CHOL, HDL, LDLCALC, TRIG, CHOLHDL, LDLDIRECT in the last 72 hours. Thyroid function studies No results for input(s): TSH, T4TOTAL, T3FREE, THYROIDAB in the last 72 hours.  Invalid input(s): FREET3 Anemia work up No results for input(s): VITAMINB12, FOLATE, FERRITIN, TIBC, IRON, RETICCTPCT in the last 72 hours. Urinalysis    Component Value Date/Time   COLORURINE YELLOW 01/27/2020 1841   APPEARANCEUR CLEAR 01/27/2020  [place] 08/03/2019 1143   LABSPEC 1.015 01/27/2020 1841   PHURINE 6.0 01/27/2020 1841   GLUCOSEU NEGATIVE 01/27/2020 1841   HGBUR SMALL (A) 01/27/2020 1841   BILIRUBINUR NEGATIVE 01/27/2020 1841   BILIRUBINUR small (A) 08/03/2019 1144   BILIRUBINUR Negative 08/03/2019 1143   KETONESUR 5 (A) 01/27/2020 1841   PROTEINUR 30 (A) 01/27/2020 1841   UROBILINOGEN 1.0 08/03/2019 1144   UROBILINOGEN 1.0 01/25/2013 0825   NITRITE NEGATIVE 01/27/2020 1841   LEUKOCYTESUR NEGATIVE 01/27/2020 1841   Sepsis Labs Invalid input(s): PROCALCITONIN,  WBC,  LACTICIDVEN Microbiology No results found for this or any previous visit (from the past 240 hour(s)).   Time coordinating discharge: Over 30 minutes  SIGNED:   Darliss Cheney, MD  [facility] 04/04/2020, 12:10 PM  If 7PM-7AM, please contact night-coverage [email or web address]

## 2020-04-04 NOTE — Progress Notes (Signed)
Physical Therapy Treatment Patient Details Name: Leonard Bentley MRN: [id number] DOB: 02/17/1961 Today's Date: 04/04/2020    History of Present Illness Pt is a 59 y/o male admitted secondary to chest tightness. Pt found to have pulmonary edema with history of diastolic congestive heart.  Underwent Transmetatarsal amputation left foot with application Prevena wound VAC on 5/14, removed 5/24; PMH including but not limited to HTN, chronic diastolic CHF, severe MR s/p mitral valve repair, recurrent PE, anxiety, non-Hodgkin's lymphoma, osteomyelitis of the left foot, and alcohol abuse.    PT Comments    Returned to address further d/c concerns including stair training, car transfers, and getting his RW.  Able to address concerns for pt to be able to d/c.  Pt continues to self push and want to progress off RW in a few days - advised use of RW.   Ideally, pt would have supervision at d/c but he does not.  Discussed safe mobility, elevating feet at rest, and ways to limit mobility/time on feet (order food/delivery, frozen meals, etc).  Recommending getting assist with IADLs as able.    Follow Up Recommendations  Home health PT;Supervision - Intermittent     Equipment Recommendations  Rolling walker with 5" wheels    Recommendations for Other Services       Precautions / Restrictions Precautions Precautions: Fall Precaution Comments: NWB L LE Restrictions Weight Bearing Restrictions: Yes LLE Weight Bearing: Non weight bearing Other Position/Activity Restrictions: Strict NWB per Ortho note; post-op shoe donned    Mobility  Bed Mobility Overal bed mobility: Independent Bed Mobility: Supine to Sit;Sit to Supine     Supine to sit: Independent Sit to supine: Independent      Transfers Overall transfer level: Needs assistance Equipment used: Rolling walker (2 wheeled) Transfers: Sit to/from [facility] Sit to Stand: Modified independent (Device/Increase time)          General transfer comment: Safety; performed multiple sit to stands with RW and knee crutch; did not need cues to extend L LE with knee crutch to prevent weight on L foot with sitting  Ambulation/Gait Ambulation/Gait assistance: Supervision Gait Distance (Feet): 200 Feet(x2) Assistive device: Rolling walker (2 wheeled) Gait Pattern/deviations: Step-through pattern     General Gait Details: Further gait training with knee crutch.  Pt achieving safe gait, not catching toe (very slight circumduction and hip hike to prevent) with RW   Stairs Stairs: Yes Stairs assistance: Min guard Stair Management: One rail Left;Sideways Number of Stairs: 8 General stair comments: up/down 8 steps with 1 rail on R, knee crutch, and cues for sequencing   Wheelchair Mobility    Modified Rankin (Stroke Patients Only)       Balance Overall balance assessment: Needs assistance Sitting-balance support: No upper extremity supported;Feet supported Sitting balance-Leahy Scale: Normal     Standing balance support: During functional activity;Single extremity supported Standing balance-Leahy Scale: Fair Standing balance comment: Required at least single UE support                            Cognition Arousal/Alertness: Awake/alert Behavior During Therapy: WFL for tasks assessed/performed Overall Cognitive Status: Within Functional Limits for tasks assessed                                 General Comments: Pt continues to push self and needs cues to self monitor activity  Exercises      General Comments General comments (skin integrity, edema, etc.): Addressed potential mobility concerns for discharging.    Discussed safe car transfers -  will be unable to get in car with knee crutch so suggested hopping short distance with RW or sitting sideways on seat and don/doffing knee crutch to get in/out of car.    Pt reports going in a cab and his walker is at in his trunk.   Advised having cab driver (who pt knows) get the walker out of the trunk.  Instructed not to ambulate any distance with cane and knee crutch as he is too unsteady.    Pt reports he plans to stay a couple days at a hotel to build strength in smaller environment before returning home.  Pt hopeful to be able to progress off RW - discussed better being safe and using RW for short term until foot heals.      Pertinent Vitals/Pain Pain Assessment: No/denies pain    Home Living                      Prior Function            PT Goals (current goals can now be found in the care plan section) Acute Rehab PT Goals Patient Stated Goal: to get stronger before going home PT Goal Formulation: With patient Time For Goal Achievement: 04/05/20 Potential to Achieve Goals: Good Progress towards PT goals: Progressing toward goals    Frequency    Min 3X/week      PT Plan Current plan remains appropriate    Co-evaluation              AM-PAC PT "6 Clicks" Mobility   Outcome Measure  Help needed turning from your back to your side while in a flat bed without using bedrails?: None Help needed moving from lying on your back to sitting on the side of a flat bed without using bedrails?: None Help needed moving to and from a bed to a chair (including a wheelchair)?: None Help needed standing up from a chair using your arms (e.g., wheelchair or bedside chair)?: None Help needed to walk in hospital room?: None Help needed climbing 3-5 steps with a railing? : None 6 Click Score: 24    End of Session Equipment Utilized During Treatment: Gait belt Activity Tolerance: Patient tolerated treatment well Patient left: in bed;with call bell/phone within reach Nurse Communication: Mobility status PT Visit Diagnosis: Other abnormalities of gait and mobility (R26.89)     Time: [id number] PT Time Calculation (min) (ACUTE ONLY): 29 min  Charges:  $Gait Training: 23-37 mins                       Maggie Font, PT Acute Rehab Services Pager [phone] [facility] [phone] [facility] [place] 04/04/2020, 2:16 PM

## 2020-04-04 NOTE — Plan of Care (Signed)
  Problem: Education: Goal: Knowledge of General Education information will improve Description: Including pain rating scale, medication(s)/side effects and non-pharmacologic comfort measures Outcome: Adequate for Discharge   Problem: Health Behavior/Discharge Planning: Goal: Ability to manage health-related needs will improve Outcome: Adequate for Discharge   Problem: Clinical Measurements: Goal: Ability to maintain clinical measurements within normal limits will improve Outcome: Adequate for Discharge Goal: Will remain free from infection Outcome: Adequate for Discharge Goal: Diagnostic test results will improve Outcome: Adequate for Discharge Goal: Respiratory complications will improve Outcome: Adequate for Discharge Goal: Cardiovascular complication will be avoided Outcome: Adequate for Discharge   Problem: Activity: Goal: Risk for activity intolerance will decrease Outcome: Adequate for Discharge   Problem: Nutrition: Goal: Adequate nutrition will be maintained Outcome: Adequate for Discharge   Problem: Coping: Goal: Level of anxiety will decrease Outcome: Adequate for Discharge   Problem: Elimination: Goal: Will not experience complications related to bowel motility Outcome: Adequate for Discharge Goal: Will not experience complications related to urinary retention Outcome: Adequate for Discharge   Problem: Pain Managment: Goal: General experience of comfort will improve Outcome: Adequate for Discharge   Problem: Safety: Goal: Ability to remain free from injury will improve Outcome: Adequate for Discharge   Problem: Skin Integrity: Goal: Risk for impaired skin integrity will decrease Outcome: Adequate for Discharge   Problem: Acute Rehab PT Goals(only PT should resolve) Goal: Pt Will Go Supine/Side To Sit Outcome: Adequate for Discharge Goal: Pt Will Go Sit To Supine/Side Outcome: Adequate for Discharge Goal: Patient Will Transfer Sit To/From  Stand Outcome: Adequate for Discharge Goal: Pt Will Ambulate Outcome: Adequate for Discharge Goal: Pt Will Go Up/Down Stairs Outcome: Adequate for Discharge   Problem: Acute Rehab OT Goals (only OT should resolve) Goal: Pt. Will Perform Lower Body Bathing Outcome: Adequate for Discharge Goal: Pt. Will Perform Lower Body Dressing Outcome: Adequate for Discharge Goal: Pt. Will Transfer To Toilet Outcome: Adequate for Discharge Goal: Pt. Will Perform Tub/Shower Transfer Outcome: Adequate for Discharge Goal: OT Additional ADL Goal #1 Outcome: Adequate for Discharge

## 2020-04-04 NOTE — Progress Notes (Signed)
Ortho-shoe too small for TMA of L foot.   [employer] notified.  Will deliver new shoe.

## 2020-04-08 ENCOUNTER — Encounter: Payer: Self-pay | Admitting: Orthopedic Surgery

## 2020-04-08 ENCOUNTER — Other Ambulatory Visit: Payer: Self-pay

## 2020-04-08 ENCOUNTER — Ambulatory Visit: Payer: Self-pay | Admitting: Cardiology

## 2020-04-08 ENCOUNTER — Telehealth: Payer: Self-pay

## 2020-04-08 ENCOUNTER — Ambulatory Visit (INDEPENDENT_AMBULATORY_CARE_PROVIDER_SITE_OTHER): Payer: Self-pay | Admitting: Orthopedic Surgery

## 2020-04-08 VITALS — Ht 69.0 in | Wt 186.0 lb

## 2020-04-08 DIAGNOSIS — Z89432 Acquired absence of left foot: Secondary | ICD-10-CM

## 2020-04-08 DIAGNOSIS — I872 Venous insufficiency (chronic) (peripheral): Secondary | ICD-10-CM

## 2020-04-08 NOTE — Telephone Encounter (Signed)
Transition Care Management Follow-up Telephone Call Date of discharge and from where: 04/04/2020 [facility] Called pt unable to reach or leave message. [phone] (Home Phone)  [phone] [facility]

## 2020-04-09 ENCOUNTER — Encounter: Payer: Self-pay | Admitting: Orthopedic Surgery

## 2020-04-09 MED ORDER — OXYCODONE HCL 5 MG PO TABS: 5.0000 mg | ORAL_TABLET | Freq: Four times a day (QID) | ORAL | 0 refills | Status: DC | PRN

## 2020-04-10 ENCOUNTER — Telehealth: Payer: Self-pay | Admitting: Orthopedic Surgery

## 2020-04-10 ENCOUNTER — Other Ambulatory Visit: Payer: Self-pay

## 2020-04-10 MED ORDER — ALPRAZOLAM 0.25 MG PO TABS: 0.2500 mg | ORAL_TABLET | Freq: Two times a day (BID) | ORAL | 0 refills | Status: DC | PRN

## 2020-04-10 NOTE — Telephone Encounter (Signed)
Patient called. He would like a refill on Xanax. His call back number is [phone]

## 2020-04-10 NOTE — Telephone Encounter (Signed)
Ok per Dr. [name] to refill xanax for pt as last written. This was sent to pharm.

## 2020-04-11 ENCOUNTER — Telehealth: Payer: Self-pay | Admitting: Orthopedic Surgery

## 2020-04-11 ENCOUNTER — Other Ambulatory Visit: Payer: Self-pay | Admitting: Family

## 2020-04-11 ENCOUNTER — Telehealth: Payer: Self-pay

## 2020-04-11 MED ORDER — ALPRAZOLAM 0.25 MG PO TABS: 0.2500 mg | ORAL_TABLET | Freq: Two times a day (BID) | ORAL | 0 refills | Status: DC | PRN

## 2020-04-11 NOTE — Telephone Encounter (Signed)
Patient called explaining that [facility] on [address] is stating that they have not received a refill of Alprazolam. The system is showing prescription is at [facility]. Informed patient to have pharmacy to give [place] a call about medication. Please contact patient about this matter. Patient phone number is [phone].

## 2020-04-11 NOTE — Telephone Encounter (Signed)
Patient called requesting a refill on Alprazolam, Please send to pharmacy on file. Patient phone number is [phone].

## 2020-04-11 NOTE — Telephone Encounter (Signed)
Patient called.   Asked if we could resend his rx for Xanax. Says his pharmacy has not received it.   Call back: [phone]

## 2020-04-11 NOTE — Telephone Encounter (Signed)
Rx sent to pharmacy   

## 2020-04-11 NOTE — Telephone Encounter (Signed)
Message sent to [facility], Rx was filled but pt called stating it wasn't at [facility]. See previous notes.

## 2020-04-11 NOTE — Telephone Encounter (Signed)
Erin please advise, pt has called several times and states that [facility] does not have his medication and he would really like to pick it up today. Thanks.

## 2020-04-11 NOTE — Telephone Encounter (Signed)
Pt called stating the Pharmacy still hasn't received his rx and would like to see if we could check on that.   [phone]

## 2020-04-14 ENCOUNTER — Telehealth: Payer: Self-pay | Admitting: Orthopedic Surgery

## 2020-04-14 NOTE — Telephone Encounter (Signed)
Error

## 2020-04-14 NOTE — Telephone Encounter (Signed)
Late entry: Patient's RX was called in.

## 2020-04-14 NOTE — Telephone Encounter (Signed)
Patient wanted me to let you know that he was very thankful for all your help.

## 2020-04-15 ENCOUNTER — Ambulatory Visit: Payer: Self-pay | Admitting: Family

## 2020-04-15 MED ORDER — OXYCODONE HCL 5 MG PO TABS: 5.0000 mg | ORAL_TABLET | Freq: Four times a day (QID) | ORAL | 0 refills | Status: DC | PRN

## 2020-04-18 ENCOUNTER — Ambulatory Visit: Payer: Self-pay | Attending: Nurse Practitioner | Admitting: Pharmacist

## 2020-04-18 ENCOUNTER — Ambulatory Visit: Payer: Self-pay | Admitting: Physician Assistant

## 2020-04-18 ENCOUNTER — Other Ambulatory Visit: Payer: Self-pay

## 2020-04-18 DIAGNOSIS — Z9889 Other specified postprocedural states: Secondary | ICD-10-CM

## 2020-04-18 DIAGNOSIS — Z86711 Personal history of pulmonary embolism: Secondary | ICD-10-CM

## 2020-04-18 LAB — POCT INR: INR: 6.7 — AB (ref 2.0–3.0)

## 2020-04-21 ENCOUNTER — Ambulatory Visit: Payer: Self-pay | Admitting: Pharmacist

## 2020-04-21 ENCOUNTER — Ambulatory Visit (INDEPENDENT_AMBULATORY_CARE_PROVIDER_SITE_OTHER): Payer: Self-pay | Admitting: Podiatry

## 2020-04-21 ENCOUNTER — Ambulatory Visit (INDEPENDENT_AMBULATORY_CARE_PROVIDER_SITE_OTHER): Payer: Self-pay

## 2020-04-21 ENCOUNTER — Encounter: Payer: Self-pay | Admitting: Orthopedic Surgery

## 2020-04-21 ENCOUNTER — Other Ambulatory Visit: Payer: Self-pay

## 2020-04-21 DIAGNOSIS — Z899 Acquired absence of limb, unspecified: Secondary | ICD-10-CM

## 2020-04-21 DIAGNOSIS — S98132A Complete traumatic amputation of one left lesser toe, initial encounter: Secondary | ICD-10-CM

## 2020-04-21 DIAGNOSIS — L97523 Non-pressure chronic ulcer of other part of left foot with necrosis of muscle: Secondary | ICD-10-CM

## 2020-04-21 DIAGNOSIS — M79672 Pain in left foot: Secondary | ICD-10-CM

## 2020-04-21 MED ORDER — OXYCODONE HCL 5 MG PO TABS: 5.0000 mg | ORAL_TABLET | Freq: Four times a day (QID) | ORAL | 0 refills | Status: DC | PRN

## 2020-04-21 NOTE — Progress Notes (Signed)
Office Visit Note   Patient: Leonard Bentley           Date of Birth: 07-Jul-1961           MRN: [id number] Visit Date: 04/08/2020              Requested by: [name], NP [address] [place],  [place] [address] PCP: [name], NP  Chief Complaint  Patient presents with  . Left Foot - Routine Post Op    03/21/20 left transmet amputation       HPI: Patient is a 59 year old gentleman who presents status post left transmetatarsal amputation.  Patient states he has pain 7 out of 10 he is weightbearing with a walker sutures are intact.  Assessment & Plan: Visit Diagnoses:  1. Venous insufficiency (chronic) (peripheral)   2. History of transmetatarsal amputation of left foot ([place])     Plan: Reviewed the importance of nonweightbearing elevation washing with soap and water recommend that he uses kneeling walker again the importance of smoking cessation was discussed.  Follow-Up Instructions: Return in about 1 week (around 04/15/2020).   Ortho Exam  Patient is alert, oriented, no adenopathy, well-dressed, normal affect, normal respiratory effort. Examination there is swelling in the left lower extremity there is 2 cm in diameter eschar over the transmetatarsal amputation.  Patient states he was living with his brother and then that went bad.  Discussed the importance of pressure offloading and smoking cessation to avoid possible transtibial amputation.  Imaging: No results found. No images are attached to the encounter.  Labs: Lab Results  Component Value Date   HGBA1C 5.8 (H) 02/05/2020   HGBA1C 5.2 10/25/2016   HGBA1C 6.1 (H) 04/03/2016   ESRSEDRATE 17 (H) 03/18/2020   ESRSEDRATE 14 01/28/2020   ESRSEDRATE 7 01/12/2020   CRP 0.5 03/18/2020   CRP 1.2 (H) 01/28/2020   CRP 0.8 01/12/2020   LABURIC 5.2 12/09/2018   LABURIC 7.4 08/18/2015   LABURIC 4.1 12/01/2011   REPTSTATUS 02/01/2020 FINAL 01/27/2020   REPTSTATUS 02/01/2020 FINAL 01/27/2020   CULT   01/27/2020    NO GROWTH 5 DAYS Performed at [facility], [place] [address], [place], [place] [address]    CULT  01/27/2020    NO GROWTH 5 DAYS Performed at [facility] [address], [place], [place] [address]      Lab Results  Component Value Date   ALBUMIN 2.7 (L) 02/02/2020   ALBUMIN 2.9 (L) 01/31/2020   ALBUMIN 2.8 (L) 01/31/2020   LABURIC 5.2 12/09/2018   LABURIC 7.4 08/18/2015   LABURIC 4.1 12/01/2011    Lab Results  Component Value Date   MG 1.6 (L) 03/19/2020   MG 1.9 02/12/2020   MG 1.7 02/11/2020   No results found for: VD25OH  No results found for: PREALBUMIN CBC EXTENDED Latest Ref Rng & Units 04/04/2020 04/02/2020 03/29/2020  WBC 4.0 - 10.5 K/uL 4.4 3.2(L) 4.5  RBC 4.22 - 5.81 MIL/uL 3.76(L) 3.53(L) 3.52(L)  HGB 13.0 - 17.0 g/dL 9.2(L) 8.5(L) 8.6(L)  HCT 39 - 52 % 29.9(L) 28.0(L) 27.7(L)  PLT 150 - 400 K/uL 484(H) 431(H) 290  NEUTROABS 1.7 - 7.7 K/uL 2.9 - 3.0  LYMPHSABS 0.7 - 4.0 K/uL 0.5(L) - 0.5(L)     Body mass index is 27.47 kg/m.  Orders:  No orders of the defined types were placed in this encounter.  Meds ordered this encounter  Medications  . DISCONTD: oxyCODONE (OXY IR/ROXICODONE)  5 MG immediate release tablet    Sig: Take 1 tablet (5 mg total) by mouth every 6 (six) hours as needed for moderate pain (pain score 4-6).    Dispense:  20 tablet    Refill:  0     Procedures: No procedures performed  Clinical Data: No additional findings.  ROS:  All other systems negative, except as noted in the HPI. Review of Systems  Objective: Vital Signs: Ht 5\' 9"  (1.753 m)   Wt 186 lb (84.4 kg)   BMI 27.47 kg/m   Specialty Comments:  No specialty comments available.  PMFS History: Patient Active Problem List   Diagnosis Date Noted  . Chest pain 03/19/2020  . Pulmonary edema 03/18/2020  . Microcytic hypochromic anemia 03/18/2020  . Wound dehiscence, surgical, initial encounter   . Pressure injury of skin 01/28/2020  .  Subacute osteomyelitis, left ankle and foot ([place]) 01/27/2020  . Sepsis ([place]) 01/27/2020  . Cellulitis of left foot   . Left foot infection 01/11/2020  . Chronic combined systolic and diastolic heart failure ([place]) 03/06/2019  . Coagulation disorder ([place]) 03/06/2019  . Impacted cerumen of both ears 02/09/2019  . Osteomyelitis of great toe of left foot ([place]) 01/09/2019  . Foot ulcer, left ([place]) 12/27/2018  . Cellulitis 12/07/2018  . Bleeding from left ear 11/28/2018  . HTN (hypertension) 11/02/2018  . UTI (urinary tract infection) 11/02/2018  . Ecchymosis   . Tachyarrhythmia 08/30/2018  . Hypomagnesemia 08/30/2018  . Recurrent pulmonary emboli ([place])   . Hematuria   . Delirium tremens ([place])   . LFTs abnormal   . Alcoholic hepatitis without ascites   . Alcohol withdrawal ([place]) 06/04/2018  . Dizziness   . Posterior tibial tendon dysfunction 02/21/2018  . Supratherapeutic INR 11/27/2016  . Dehydration 11/27/2016  . SVT (supraventricular tachycardia) ([place])   . S/P minimally-invasive mitral valve repair 10/27/2016  . History of pulmonary embolus (PE) 04/11/2016  . Left sided numbness   . Anxiety 04/02/2016  . Panic attacks 04/02/2016  . Mitral valve prolapse   . Mitral regurgitation 01/20/2016  . Atrial tachycardia ([place]) 01/06/2016  . Murmur 01/06/2016  . Mobitz I 10/09/2015  . PAC (premature atrial contraction) 10/09/2015  . Arthritis 08/14/2015  . Neuropathy due to chemotherapeutic drug ([place]) 03/09/2013  . Leucopenia 01/31/2013  . Personal history of pulmonary embolism 12/28/2012  . Alcohol abuse 08/24/2012  . Cigarette smoker 08/24/2012  . Hemorrhoid 03/08/2012  . Anemia 12/10/2011  . History of non-Hodgkin's lymphoma 09/25/2011   Past Medical History:  Diagnosis Date  . Acute pulmonary embolism ([place]) 04/11/2016  . Anxiety   . Arthritis   . Atrial tachycardia ([place]) 01/06/2016  . Depression   . DTs (delirium tremens) ([place])   . Dyspnea   . ED (erectile dysfunction)   .  ETOH abuse   . Hypertension   . Lymphoma, non Hodgkin's 09/25/2011   Stage 2  . Mitral regurgitation 01/20/2016  . Mobitz I 10/09/2015  . Murmur 01/06/2016  . Occasional tremors   . PAC (premature atrial contraction) 10/09/2015  . S/P minimally-invasive mitral valve repair 10/27/2016   Complex valvuloplasty including artificial Gore-tex neochord placement x6 and 34 mm Edwards Physio II ring annuloplasty via right mini thoracotomy approach    Family History  Problem Relation Age of Onset  . Cancer Mother        BREAST(BONE)  . Cancer Father        PANCREATIC  . Hypertension Maternal Grandmother   . Stroke Maternal  Aunt   . Heart attack Neg Hx     Past Surgical History:  Procedure Laterality Date  . AMPUTATION Left 01/12/2019   Procedure: LEFT GREAT TOE AMPUTATION;  Surgeon: [name], MD;  Location: [facility];  Service: Orthopedics;  Laterality: Left;  . AMPUTATION Left 01/16/2020   Procedure: LEFT 1ST METATARSAL AND SECOND TOE AMPUTATION;  Surgeon: [name], MD;  Location: [facility];  Service: Orthopedics;  Laterality: Left;  . AMPUTATION Left 01/30/2020   Procedure: LEFT FOOT 3RD RAY AMPUTATION;  Surgeon: [name], MD;  Location: [facility];  Service: Orthopedics;  Laterality: Left;  . AMPUTATION Left 03/21/2020   Procedure: MIDFOOT AMPUTATION; LEFT;  Surgeon: [name], MD;  Location: [facility];  Service: Orthopedics;  Laterality: Left;  . CARDIAC CATHETERIZATION N/A 08/13/2016   Procedure: Right/Left Heart Cath and Coronary Angiography;  Surgeon: [name], MD;  Location: [facility];  Service: Cardiovascular;  Laterality: N/A;  . MITRAL VALVE REPAIR Right 10/27/2016   Procedure: MINIMALLY INVASIVE MITRAL VALVE REPAIR (MVR) USING 34 PHYSIO II ANNULOPLASTY RING;  Surgeon: [name], MD;  Location: [facility];  Service: Open Heart Surgery;  Laterality: Right;  . SKIN BIOPSY Right 04/04/2018   right mid medial anterior tibial shave  see report in chart  . TEE  WITHOUT CARDIOVERSION N/A 02/11/2016   Procedure: TRANSESOPHAGEAL ECHOCARDIOGRAM (TEE);  Surgeon: [name], MD;  Location: [facility];  Service: Cardiovascular;  Laterality: N/A;  . TEE WITHOUT CARDIOVERSION N/A 10/27/2016   Procedure: TRANSESOPHAGEAL ECHOCARDIOGRAM (TEE);  Surgeon: [name], MD;  Location: [facility];  Service: Open Heart Surgery;  Laterality: N/A;  . TRIGGER FINGER RELEASE Bilateral    Social History   Occupational History  . Occupation: unemployed  Tobacco Use  . Smoking status: Current Every Dosanjh Smoker    Packs/Haff: 1.00    Years: 40.00    Pack years: 40.00    Types: Cigarettes, E-cigarettes  . Smokeless tobacco: Former Systems developer    Quit date: 1985  . Tobacco comment: vapes all [place]  Vaping Use  . Vaping Use: Some days  . Substances: Flavoring  Substance and Sexual Activity  . Alcohol use: Yes    Alcohol/week: 0.0 standard drinks    Comment: 1-2 bottles of red wine a [place]capher  . Drug use: No  . Sexual activity: Not Currently

## 2020-04-22 ENCOUNTER — Ambulatory Visit: Payer: Self-pay | Admitting: Physician Assistant

## 2020-04-22 NOTE — Progress Notes (Signed)
Subjective:   Patient ID: Leonard Bentley, male   DOB: 59 y.o.   MRN: [id number]   HPI 58 year old male with a history of multiple comorbidities presents the office today for concerns of a nonhealing wound after undergoing left foot transmetatarsal amputation.  He originally underwent surgery in March 2020 for left big toe amputation.  In March 2021 he underwent first and second toe amputation followed by March 24 third reputation and ultimately midfoot amputation on May 14.  Also discussed with him the limitation that he did not want to have this done.  He states he is having pain is asking for refill of pain medication.  He admits to bloody drainage on the bandage but denies any pus.  Has occasional swelling of the foot.  He has no new other concerns today.   Review of Systems  All other systems reviewed and are negative.  Past Medical History:  Diagnosis Date  . Acute pulmonary embolism ([place]) 04/11/2016  . Anxiety   . Arthritis   . Atrial tachycardia ([place]) 01/06/2016  . Depression   . DTs (delirium tremens) ([place])   . Dyspnea   . ED (erectile dysfunction)   . ETOH abuse   . Hypertension   . Lymphoma, non Hodgkin's 09/25/2011   Stage 2  . Mitral regurgitation 01/20/2016  . Mobitz I 10/09/2015  . Murmur 01/06/2016  . Occasional tremors   . PAC (premature atrial contraction) 10/09/2015  . S/P minimally-invasive mitral valve repair 10/27/2016   Complex valvuloplasty including artificial Gore-tex neochord placement x6 and 34 mm Edwards Physio II ring annuloplasty via right mini thoracotomy approach    Past Surgical History:  Procedure Laterality Date  . AMPUTATION Left 01/12/2019   Procedure: LEFT GREAT TOE AMPUTATION;  Surgeon: [name], MD;  Location: [facility];  Service: Orthopedics;  Laterality: Left;  . AMPUTATION Left 01/16/2020   Procedure: LEFT 1ST METATARSAL AND SECOND TOE AMPUTATION;  Surgeon: [name], MD;  Location: [facility];  Service: Orthopedics;  Laterality: Left;  .  AMPUTATION Left 01/30/2020   Procedure: LEFT FOOT 3RD RAY AMPUTATION;  Surgeon: [name], MD;  Location: [facility];  Service: Orthopedics;  Laterality: Left;  . AMPUTATION Left 03/21/2020   Procedure: MIDFOOT AMPUTATION; LEFT;  Surgeon: [name], MD;  Location: [facility];  Service: Orthopedics;  Laterality: Left;  . CARDIAC CATHETERIZATION N/A 08/13/2016   Procedure: Right/Left Heart Cath and Coronary Angiography;  Surgeon: [name], MD;  Location: [facility];  Service: Cardiovascular;  Laterality: N/A;  . MITRAL VALVE REPAIR Right 10/27/2016   Procedure: MINIMALLY INVASIVE MITRAL VALVE REPAIR (MVR) USING 34 PHYSIO II ANNULOPLASTY RING;  Surgeon: [name], MD;  Location: [facility];  Service: Open Heart Surgery;  Laterality: Right;  . SKIN BIOPSY Right 04/04/2018   right mid medial anterior tibial shave  see report in chart  . TEE WITHOUT CARDIOVERSION N/A 02/11/2016   Procedure: TRANSESOPHAGEAL ECHOCARDIOGRAM (TEE);  Surgeon: [name], MD;  Location: [facility];  Service: Cardiovascular;  Laterality: N/A;  . TEE WITHOUT CARDIOVERSION N/A 10/27/2016   Procedure: TRANSESOPHAGEAL ECHOCARDIOGRAM (TEE);  Surgeon: [name], MD;  Location: [facility];  Service: Open Heart Surgery;  Laterality: N/A;  . TRIGGER FINGER RELEASE Bilateral      Current Outpatient Medications:  .  ALPRAZolam (XANAX) 0.25 MG tablet, Take 1 tablet (0.25 mg total) by mouth 2 (two) times daily as needed for anxiety., Disp: 10 tablet, Rfl: 0 .  gabapentin (NEURONTIN) 100 MG capsule, Take 2 capsules (200 mg total) by mouth 3 (three) times daily., Disp: 180 capsule, Rfl: 0 .  metoprolol succinate (TOPROL-XL) 25 MG 24 hr tablet, Take 0.5 tablets (12.5 mg total) by mouth daily., Disp: 15 tablet, Rfl: 0 .  nicotine (NICODERM CQ - DOSED IN MG/24 HOURS) 21 mg/24hr patch, Place 1 patch (21 mg total) onto the skin daily., Disp: 28 patch, Rfl: 0 .  oxyCODONE (OXY IR/ROXICODONE) 5 MG immediate release  tablet, Take 1 tablet (5 mg total) by mouth every 6 (six) hours as needed for moderate pain (pain score 4-6)., Disp: 20 tablet, Rfl: 0 .  warfarin (COUMADIN) 7.5 MG tablet, Take 1 tablet (7.5 mg total) by mouth one time only at 4 PM for 30 doses. Take 1 tablet by mouth daily. Follow-up with [facility] for INR and dosing instructions, Disp: 30 tablet, Rfl: 0  Allergies  Allergen Reactions  . Other Other (See Comments)    "Cactus" caused blisters on tongue (if prepared as food)          Objective:  Physical Exam  General: AAO x3, NAD  Dermatological: Previous transmetatarsal amputation with sutures intact.  Necrotic tissue present along the central aspect the wound.  The wound measures 10 x 4 cm and there are areas of probe approximately 4 cm but not the entire incision.  There is fibrotic tissue present as well.  There is no fluctuation crepitation.  There is no significant malodor.  Mild swelling to the foot any warmth.          Vascular: Dorsalis Pedis artery and Posterior Tibial artery pedal pulses are 2/4.  There is no pain with calf compression, swelling, warmth, erythema.   Neruologic: Sensation decreased with Thornell Mule monofilament  Musculoskeletal: Transmetatarsal rotation  Gait: Presents nonweightbearing      Assessment:   59 year old male wound status post transmetatarsal imitation     Plan:  -Treatment options discussed including all alternatives, risks, and complications -Etiology of symptoms were discussed -X-rays were obtained and reviewed with the patient.  There is no definitive evidence of acute osteomyelitis and there is some remodeling present metatarsals. -I long discussion with him regards to his treatment options.  Discussed with nonhealing would recommend a below-knee amputation.  He states it was explored any alternatives possible to help save the foot.  This is my first time seeing the foot still not sure how it has been healing  however does have necrotic tissue in it hurts quite a bit.  I discussed the possible wound excision, graft application and wound VAC to try to help healing.  However this will not work will likely need amputation.  He does have an appointment tomorrow with Dr. [name].  We will continue local wound care for now and see him back on Monday.   Trula Slade DPM

## 2020-04-23 ENCOUNTER — Other Ambulatory Visit: Payer: Self-pay

## 2020-04-23 ENCOUNTER — Ambulatory Visit: Payer: Self-pay | Attending: Nurse Practitioner | Admitting: Pharmacist

## 2020-04-23 ENCOUNTER — Telehealth: Payer: Self-pay | Admitting: Orthopedic Surgery

## 2020-04-23 DIAGNOSIS — Z86711 Personal history of pulmonary embolism: Secondary | ICD-10-CM

## 2020-04-23 DIAGNOSIS — Z9889 Other specified postprocedural states: Secondary | ICD-10-CM

## 2020-04-23 LAB — POCT INR: INR: 1.9 — AB (ref 2.0–3.0)

## 2020-04-23 MED ORDER — WARFARIN SODIUM 5 MG PO TABS
2.5000 mg | ORAL_TABLET | Freq: Every day | ORAL | 0 refills | Status: DC
Start: 2020-04-23 — End: 2020-04-30

## 2020-04-23 NOTE — Telephone Encounter (Signed)
Called patient left message to return call to schedule an appointment for wound check with Dr [name]

## 2020-04-24 ENCOUNTER — Other Ambulatory Visit: Payer: Self-pay

## 2020-04-24 MED ORDER — DOXYCYCLINE HYCLATE 100 MG PO TABS: 100.0000 mg | ORAL_TABLET | Freq: Two times a day (BID) | ORAL | 0 refills | Status: DC

## 2020-04-25 ENCOUNTER — Telehealth: Payer: Self-pay | Admitting: Orthopedic Surgery

## 2020-04-25 ENCOUNTER — Telehealth: Payer: Self-pay

## 2020-04-25 NOTE — Telephone Encounter (Signed)
Patient was called and lvm stating that if he has concerns about any of his diagnosis we can discuss those concerns in our office next week at his appointment. If patient have any other concerns he should give [place] a call.

## 2020-04-25 NOTE — Telephone Encounter (Signed)
Patient called stating that he has been in the hospital for about 6 weeks and was not told about any diabetes.  He wanting to know if you could check his chart.  CB#[phone]

## 2020-04-25 NOTE — Telephone Encounter (Signed)
Needs to get from his pcp

## 2020-04-25 NOTE — Telephone Encounter (Signed)
Patient was called and lvm stating he should call his PCP and notify them of him needing anxiety medication.

## 2020-04-25 NOTE — Telephone Encounter (Signed)
Patient called in wanting to get a refill for xanax and have it sent to [facility] on [address].

## 2020-04-25 NOTE — Telephone Encounter (Signed)
Please advise, thank you.

## 2020-04-28 ENCOUNTER — Encounter: Payer: Self-pay | Admitting: Orthopedic Surgery

## 2020-04-28 ENCOUNTER — Ambulatory Visit (INDEPENDENT_AMBULATORY_CARE_PROVIDER_SITE_OTHER): Payer: Self-pay | Admitting: Orthopedic Surgery

## 2020-04-28 ENCOUNTER — Other Ambulatory Visit: Payer: Self-pay

## 2020-04-28 VITALS — Ht 69.0 in | Wt 186.0 lb

## 2020-04-28 DIAGNOSIS — T8781 Dehiscence of amputation stump: Secondary | ICD-10-CM

## 2020-04-28 DIAGNOSIS — Z89432 Acquired absence of left foot: Secondary | ICD-10-CM

## 2020-04-28 MED ORDER — ALPRAZOLAM 0.25 MG PO TABS: 0.2500 mg | ORAL_TABLET | Freq: Two times a day (BID) | ORAL | 0 refills | Status: AC | PRN

## 2020-04-28 NOTE — Progress Notes (Signed)
Office Visit Note   Patient: Leonard Bentley           Date of Birth: September 18, 1961           MRN: [id number] Visit Date: 04/28/2020              Requested by: [name], NP [address] [place],  [place] [address] PCP: [name], NP  Chief Complaint  Patient presents with  . Left Foot - Routine Post Op    03/21/20 left transmet amputation       HPI: Patient is a 59 year old gentleman who presents in follow-up for a left transmetatarsal amputation with dehiscence and necrosis odor and drainage.  Patient is currently on doxycycline.  Patient states that he is still smoking.  Assessment & Plan: Visit Diagnoses:  1. History of transmetatarsal amputation of left foot ([place])   2. Dehiscence of amputation stump ([facility])     Plan: Discussed with patient his best option would be to proceed with a transtibial amputation.  Discussed that with the extensive wound necrosis he has increased risk for systemic infection.  Discussed the importance of smoking cessation perioperatively to try to facilitate wound healing.  A refill prescription was called in for his Xanax.  Patient states he would like to call in 2 days to make a determination regarding surgical intervention.  We discussed that further foot salvage intervention would not provide him sufficient foot for activities of daily living and that tissue grafting with the extensive necrotic tissue would not be a viable option.  Follow-Up Instructions: Return in about 2 weeks (around 05/12/2020).   Ortho Exam  Patient is alert, oriented, no adenopathy, well-dressed, normal affect, normal respiratory effort. Examination patient has a strong palpable anterior tibial pulse.  He has a large necrotic wound with swelling over the transmetatarsal amputation.  The area of necrotic tissue was 10 x 4 cm and this probes 6 cm deep.  The wound extends down to the metatarsals.  There is no ascending cellulitis.  Imaging: No results found. No  images are attached to the encounter.  Labs: Lab Results  Component Value Date   HGBA1C 5.8 (H) 02/05/2020   HGBA1C 5.2 10/25/2016   HGBA1C 6.1 (H) 04/03/2016   ESRSEDRATE 17 (H) 03/18/2020   ESRSEDRATE 14 01/28/2020   ESRSEDRATE 7 01/12/2020   CRP 0.5 03/18/2020   CRP 1.2 (H) 01/28/2020   CRP 0.8 01/12/2020   LABURIC 5.2 12/09/2018   LABURIC 7.4 08/18/2015   LABURIC 4.1 12/01/2011   REPTSTATUS 02/01/2020 FINAL 01/27/2020   REPTSTATUS 02/01/2020 FINAL 01/27/2020   CULT  01/27/2020    NO GROWTH 5 DAYS Performed at [facility], [place] [address] [place], [place] [address]    CULT  01/27/2020    NO GROWTH 5 DAYS Performed at [facility] [address] [place], [place] [address]      Lab Results  Component Value Date   ALBUMIN 2.7 (L) 02/02/2020   ALBUMIN 2.9 (L) 01/31/2020   ALBUMIN 2.8 (L) 01/31/2020   LABURIC 5.2 12/09/2018   LABURIC 7.4 08/18/2015   LABURIC 4.1 12/01/2011    Lab Results  Component Value Date   MG 1.6 (L) 03/19/2020   MG 1.9 02/12/2020   MG 1.7 02/11/2020   No results found for: VD25OH  No results found for: PREALBUMIN CBC EXTENDED Latest Ref Rng & Units 04/04/2020 04/02/2020 03/29/2020  WBC 4.0 - 10.5 K/uL 4.4 3.2(L) 4.5  RBC  4.22 - 5.81 MIL/uL 3.76(L) 3.53(L) 3.52(L)  HGB 13.0 - 17.0 g/dL 9.2(L) 8.5(L) 8.6(L)  HCT 39 - 52 % 29.9(L) 28.0(L) 27.7(L)  PLT 150 - 400 K/uL 484(H) 431(H) 290  NEUTROABS 1.7 - 7.7 K/uL 2.9 - 3.0  LYMPHSABS 0.7 - 4.0 K/uL 0.5(L) - 0.5(L)     Body mass index is 27.47 kg/m.  Orders:  No orders of the defined types were placed in this encounter.  Meds ordered this encounter  Medications  . ALPRAZolam (XANAX) 0.25 MG tablet    Sig: Take 1 tablet (0.25 mg total) by mouth 2 (two) times daily as needed for anxiety.    Dispense:  60 tablet    Refill:  0     Procedures: No procedures performed  Clinical Data: No additional findings.  ROS:  All other systems negative, except as noted in the  HPI. Review of Systems  Objective: Vital Signs: Ht 5\' 9"  (1.753 m)   Wt 186 lb (84.4 kg)   BMI 27.47 kg/m   Specialty Comments:  No specialty comments available.  PMFS History: Patient Active Problem List   Diagnosis Date Noted  . Chest pain 03/19/2020  . Pulmonary edema 03/18/2020  . Microcytic hypochromic anemia 03/18/2020  . Wound dehiscence, surgical, initial encounter   . Pressure injury of skin 01/28/2020  . Subacute osteomyelitis, left ankle and foot ([place]) 01/27/2020  . Sepsis ([place]) 01/27/2020  . Cellulitis of left foot   . Left foot infection 01/11/2020  . Chronic combined systolic and diastolic heart failure ([place]) 03/06/2019  . Coagulation disorder ([place]) 03/06/2019  . Impacted cerumen of both ears 02/09/2019  . Osteomyelitis of great toe of left foot ([place]) 01/09/2019  . Foot ulcer, left ([place]) 12/27/2018  . Cellulitis 12/07/2018  . Bleeding from left ear 11/28/2018  . HTN (hypertension) 11/02/2018  . UTI (urinary tract infection) 11/02/2018  . Ecchymosis   . Tachyarrhythmia 08/30/2018  . Hypomagnesemia 08/30/2018  . Recurrent pulmonary emboli ([place])   . Hematuria   . Delirium tremens ([place])   . LFTs abnormal   . Alcoholic hepatitis without ascites   . Alcohol withdrawal ([place]) 06/04/2018  . Dizziness   . Posterior tibial tendon dysfunction 02/21/2018  . Supratherapeutic INR 11/27/2016  . Dehydration 11/27/2016  . SVT (supraventricular tachycardia) ([place])   . S/P minimally-invasive mitral valve repair 10/27/2016  . History of pulmonary embolus (PE) 04/11/2016  . Left sided numbness   . Anxiety 04/02/2016  . Panic attacks 04/02/2016  . Mitral valve prolapse   . Mitral regurgitation 01/20/2016  . Atrial tachycardia ([place]) 01/06/2016  . Murmur 01/06/2016  . Mobitz I 10/09/2015  . PAC (premature atrial contraction) 10/09/2015  . Arthritis 08/14/2015  . Neuropathy due to chemotherapeutic drug ([place]) 03/09/2013  . Leucopenia 01/31/2013  . Personal history of  pulmonary embolism 12/28/2012  . Alcohol abuse 08/24/2012  . Cigarette smoker 08/24/2012  . Hemorrhoid 03/08/2012  . Anemia 12/10/2011  . History of non-Hodgkin's lymphoma 09/25/2011   Past Medical History:  Diagnosis Date  . Acute pulmonary embolism ([place]) 04/11/2016  . Anxiety   . Arthritis   . Atrial tachycardia ([place]) 01/06/2016  . Depression   . DTs (delirium tremens) ([place])   . Dyspnea   . ED (erectile dysfunction)   . ETOH abuse   . Hypertension   . Lymphoma, non Hodgkin's 09/25/2011   Stage 2  . Mitral regurgitation 01/20/2016  . Mobitz I 10/09/2015  . Murmur 01/06/2016  . Occasional tremors   .  PAC (premature atrial contraction) 10/09/2015  . S/P minimally-invasive mitral valve repair 10/27/2016   Complex valvuloplasty including artificial Gore-tex neochord placement x6 and 34 mm Edwards Physio II ring annuloplasty via right mini thoracotomy approach    Family History  Problem Relation Age of Onset  . Cancer Mother        BREAST(BONE)  . Cancer Father        PANCREATIC  . Hypertension Maternal Grandmother   . Stroke Maternal Aunt   . Heart attack Neg Hx     Past Surgical History:  Procedure Laterality Date  . AMPUTATION Left 01/12/2019   Procedure: LEFT GREAT TOE AMPUTATION;  Surgeon: [name], MD;  Location: [facility];  Service: Orthopedics;  Laterality: Left;  . AMPUTATION Left 01/16/2020   Procedure: LEFT 1ST METATARSAL AND SECOND TOE AMPUTATION;  Surgeon: [name], MD;  Location: [facility];  Service: Orthopedics;  Laterality: Left;  . AMPUTATION Left 01/30/2020   Procedure: LEFT FOOT 3RD RAY AMPUTATION;  Surgeon: [name], MD;  Location: [facility];  Service: Orthopedics;  Laterality: Left;  . AMPUTATION Left 03/21/2020   Procedure: MIDFOOT AMPUTATION; LEFT;  Surgeon: [name], MD;  Location: [facility];  Service: Orthopedics;  Laterality: Left;  . CARDIAC CATHETERIZATION N/A 08/13/2016   Procedure: Right/Left Heart Cath and Coronary Angiography;  Surgeon:  [name], MD;  Location: [facility];  Service: Cardiovascular;  Laterality: N/A;  . MITRAL VALVE REPAIR Right 10/27/2016   Procedure: MINIMALLY INVASIVE MITRAL VALVE REPAIR (MVR) USING 34 PHYSIO II ANNULOPLASTY RING;  Surgeon: [name], MD;  Location: [facility];  Service: Open Heart Surgery;  Laterality: Right;  . SKIN BIOPSY Right 04/04/2018   right mid medial anterior tibial shave  see report in chart  . TEE WITHOUT CARDIOVERSION N/A 02/11/2016   Procedure: TRANSESOPHAGEAL ECHOCARDIOGRAM (TEE);  Surgeon: [name], MD;  Location: [facility];  Service: Cardiovascular;  Laterality: N/A;  . TEE WITHOUT CARDIOVERSION N/A 10/27/2016   Procedure: TRANSESOPHAGEAL ECHOCARDIOGRAM (TEE);  Surgeon: [name], MD;  Location: [facility];  Service: Open Heart Surgery;  Laterality: N/A;  . TRIGGER FINGER RELEASE Bilateral    Social History   Occupational History  . Occupation: unemployed  Tobacco Use  . Smoking status: Current Every Harnisch Smoker    Packs/Hibler: 1.00    Years: 40.00    Pack years: 40.00    Types: Cigarettes, E-cigarettes  . Smokeless tobacco: Former Systems developer    Quit date: 1985  . Tobacco comment: vapes all [place]  Vaping Use  . Vaping Use: Some days  . Substances: Flavoring  Substance and Sexual Activity  . Alcohol use: Yes    Alcohol/week: 0.0 standard drinks    Comment: 1-2 bottles of red wine a [place]  . Drug use: No  . Sexual activity: Not Currently

## 2020-04-29 ENCOUNTER — Telehealth: Payer: Self-pay | Admitting: *Deleted

## 2020-04-29 ENCOUNTER — Ambulatory Visit (INDEPENDENT_AMBULATORY_CARE_PROVIDER_SITE_OTHER): Payer: Self-pay | Admitting: Podiatry

## 2020-04-29 DIAGNOSIS — L97523 Non-pressure chronic ulcer of other part of left foot with necrosis of muscle: Secondary | ICD-10-CM

## 2020-04-29 DIAGNOSIS — Z899 Acquired absence of limb, unspecified: Secondary | ICD-10-CM

## 2020-04-29 DIAGNOSIS — T8131XA Disruption of external operation (surgical) wound, not elsewhere classified, initial encounter: Secondary | ICD-10-CM

## 2020-04-29 MED ORDER — SANTYL 250 UNIT/GM EX OINT: 1.0000 "application " | TOPICAL_OINTMENT | Freq: Every day | CUTANEOUS | 0 refills | Status: AC

## 2020-04-29 NOTE — Telephone Encounter (Signed)
-----   Message from Trula Slade, DPM sent at 04/29/2020  8:45 AM EDT ----- Can you please refer to the wound care center? Thanks.

## 2020-04-29 NOTE — Progress Notes (Signed)
Subjective: 59 year old male presents the office for proper evaluation after undergoing transmetatarsal amputation resulting dehiscence and necrotic tissue.  Is had multiple surgeries performed by Dr. [name] he did well with Dr. [name] yesterday.  He is recommended transtibial amputation the patient does not want to proceed with that at this time and wants to try to save his foot.  He denies any systemic concerns including fevers, chills.  He has noticed swelling to the foot which is been ongoing since the surgery.  He denies any purulence coming from the wound or any redness.  He does continue to smoke and has been weightbearing.  Objective: AAO x3, NAD DP/PT pulses palpable bilaterally, CRT less than 3 seconds Status post transmetatarsal amputation with sutures intact.  Necrotic, fibrotic tissue was evident.  The wound today measures 10 x 4 x 3 cm.  There is swelling to the foot there is no erythema or warmth.  There is no areas of fluctuation crepitation.  No pain with calf compression, swelling, warmth, erythema  Assessment: Transmetatarsal rotation with weakness  Plan: -All treatment options discussed with the patient including all alternatives, risks, complications.  -I long discussion with MRI versus treatment options at this point.  Again I agree with Dr. [name] I recommended transtibial amputation he declines this.  We discussed pros and cons of limb salvage versus amputation no concern for systemic infection developing should this continue or worsen.  He is to monitor this very closely.  We will set switch to Santyl dressing changes daily.  Referral to wound care center and recommended to follow up with Dr. [name]. -Encouraged to stop smoking. -Nonweightbearing -Patient encouraged to call the office with any questions, concerns, change in symptoms.   Trula Slade DPM

## 2020-04-29 NOTE — Telephone Encounter (Signed)
Faxed required form, clinicals of last 2 office visits and demographics to [facility].

## 2020-04-30 ENCOUNTER — Other Ambulatory Visit: Payer: Self-pay | Admitting: Podiatry

## 2020-04-30 ENCOUNTER — Ambulatory Visit: Payer: Self-pay | Attending: Nurse Practitioner | Admitting: Pharmacist

## 2020-04-30 ENCOUNTER — Other Ambulatory Visit: Payer: Self-pay

## 2020-04-30 DIAGNOSIS — Z86711 Personal history of pulmonary embolism: Secondary | ICD-10-CM

## 2020-04-30 DIAGNOSIS — Z9889 Other specified postprocedural states: Secondary | ICD-10-CM

## 2020-04-30 DIAGNOSIS — S98132A Complete traumatic amputation of one left lesser toe, initial encounter: Secondary | ICD-10-CM

## 2020-04-30 LAB — POCT INR: INR: 1.3 — AB (ref 2.0–3.0)

## 2020-04-30 MED ORDER — WARFARIN SODIUM 5 MG PO TABS
ORAL_TABLET | ORAL | 0 refills | Status: DC
Start: 2020-04-30 — End: 2020-05-14

## 2020-04-30 MED FILL — WARFARIN SODIUM 5 MG TABLET: 5 | 30 days supply | Qty: 60 | Fill #0

## 2020-04-30 NOTE — Addendum Note (Signed)
Addended by: Daisy Blossom, Aniqa Hare L on: 04/30/2020 02:00 PM   Modules accepted: Orders

## 2020-05-01 ENCOUNTER — Telehealth: Payer: Self-pay | Admitting: Podiatry

## 2020-05-01 ENCOUNTER — Telehealth: Payer: Self-pay | Admitting: Nurse Practitioner

## 2020-05-01 MED ORDER — METOPROLOL SUCCINATE ER 25 MG PO TB24: 12.5000 mg | ORAL_TABLET | Freq: Every day | ORAL | 0 refills | Status: DC

## 2020-05-01 NOTE — Telephone Encounter (Signed)
Rx sent 

## 2020-05-01 NOTE — Telephone Encounter (Signed)
Pt called requesting refill of Metoprolol. Pt was informed that this medication was not prescribed by our office but requested that Dr. Jacqualyn Posey refill.  Please give patient a call.

## 2020-05-01 NOTE — Telephone Encounter (Signed)
Called in and requested for listed medication to be refilled and sent to [facility]  metoprolol succinate (TOPROL-XL) 25 MG 24 hr tablet [[id number]]   Patient also requested for a call regarding the way he is supposed to take his warfarin. Please follow up at your earliest convenience.

## 2020-05-07 ENCOUNTER — Encounter (HOSPITAL_BASED_OUTPATIENT_CLINIC_OR_DEPARTMENT_OTHER): Payer: Medicaid Other | Attending: Internal Medicine | Admitting: Physician Assistant

## 2020-05-07 ENCOUNTER — Ambulatory Visit: Payer: Self-pay | Attending: Family Medicine | Admitting: Pharmacist

## 2020-05-07 ENCOUNTER — Ambulatory Visit: Payer: MEDICAID | Admitting: Podiatry

## 2020-05-07 ENCOUNTER — Other Ambulatory Visit: Payer: Self-pay

## 2020-05-07 DIAGNOSIS — T8131XA Disruption of external operation (surgical) wound, not elsewhere classified, initial encounter: Secondary | ICD-10-CM

## 2020-05-07 DIAGNOSIS — Z86711 Personal history of pulmonary embolism: Secondary | ICD-10-CM | POA: Diagnosis not present

## 2020-05-07 DIAGNOSIS — F17218 Nicotine dependence, cigarettes, with other nicotine-induced disorders: Secondary | ICD-10-CM | POA: Diagnosis not present

## 2020-05-07 DIAGNOSIS — L98499 Non-pressure chronic ulcer of skin of other sites with unspecified severity: Secondary | ICD-10-CM | POA: Diagnosis present

## 2020-05-07 DIAGNOSIS — L97529 Non-pressure chronic ulcer of other part of left foot with unspecified severity: Secondary | ICD-10-CM | POA: Insufficient documentation

## 2020-05-07 DIAGNOSIS — M1990 Unspecified osteoarthritis, unspecified site: Secondary | ICD-10-CM | POA: Insufficient documentation

## 2020-05-07 DIAGNOSIS — I110 Hypertensive heart disease with heart failure: Secondary | ICD-10-CM | POA: Diagnosis not present

## 2020-05-07 DIAGNOSIS — I341 Nonrheumatic mitral (valve) prolapse: Secondary | ICD-10-CM | POA: Diagnosis not present

## 2020-05-07 DIAGNOSIS — Z809 Family history of malignant neoplasm, unspecified: Secondary | ICD-10-CM | POA: Diagnosis not present

## 2020-05-07 DIAGNOSIS — G629 Polyneuropathy, unspecified: Secondary | ICD-10-CM | POA: Diagnosis not present

## 2020-05-07 DIAGNOSIS — Z8249 Family history of ischemic heart disease and other diseases of the circulatory system: Secondary | ICD-10-CM | POA: Diagnosis not present

## 2020-05-07 DIAGNOSIS — I509 Heart failure, unspecified: Secondary | ICD-10-CM | POA: Diagnosis not present

## 2020-05-07 DIAGNOSIS — Z9889 Other specified postprocedural states: Secondary | ICD-10-CM

## 2020-05-07 DIAGNOSIS — L97523 Non-pressure chronic ulcer of other part of left foot with necrosis of muscle: Secondary | ICD-10-CM

## 2020-05-07 DIAGNOSIS — Z899 Acquired absence of limb, unspecified: Secondary | ICD-10-CM

## 2020-05-07 LAB — POCT INR: INR: 1.4 — AB (ref 2.0–3.0)

## 2020-05-07 NOTE — Progress Notes (Signed)
HALEEM, Leonard Bentley ([id number]) Visit Report for 05/07/2020 Abuse/Suicide Risk Screen Details Patient Name: Date of Service: Leonard Bentley 05/07/2020 10:30 A M Medical Record Number: [id number] Patient Account Number: [id number] Date of Birth/Sex: Treating RN: 1961-08-31 (59 y.o. Male) Levan Hurst Primary Care Lasha Echeverria: Leonard Bentley Other Clinician: Referring Gwynevere Lizana: Treating Ly Wass/Extender: Leonard Bentley, Leonard Bentley in Treatment: 0 Abuse/Suicide Risk Screen Items Answer ABUSE RISK SCREEN: Has anyone close to you tried to hurt or harm you recentlyo No Do you feel uncomfortable with anyone in your familyo No Has anyone forced you do things that you didnt want to doo No Electronic Signature(s) Signed: 05/07/2020 5:49:00 PM By: Levan Hurst RN, BSN Entered By: Levan Hurst on 05/07/2020 11:12:34 -------------------------------------------------------------------------------- Activities of Daily Living Details Patient Name: Date of Service: Leonard Bentley 05/07/2020 10:30 A M Medical Record Number: [id number] Patient Account Number: [id number] Date of Birth/Sex: Treating RN: 19-Mar-1961 (59 y.o. Male) Levan Hurst Primary Care Nandana Krolikowski: Leonard Bentley Other Clinician: Referring Bonnie Overdorf: Treating Machi Whittaker/Extender: Leonard Bentley, Leonard Bentley in Treatment: 0 Activities of Daily Living Items Answer Activities of Daily Living (Please select one for each item) Drive Automobile Not Able T Medications ake Completely Able Use T elephone Completely Able Care for Appearance Completely Able Use T oilet Completely Able Bath / Shower Completely Able Dress Self Completely Able Feed Self Completely Able Walk Completely Able Get In / Out Bed Completely Able Housework Completely Able Prepare Meals Completely [place] Completely Able Shop for Self Need Assistance Electronic Signature(s) Signed: 05/07/2020 5:49:00 PM By: Levan Hurst RN, BSN Entered  By: Levan Hurst on 05/07/2020 11:13:04 -------------------------------------------------------------------------------- Education Screening Details Patient Name: Date of Service: Leonard Dunning A. 05/07/2020 10:30 A M Medical Record Number: [id number] Patient Account Number: [id number] Date of Birth/Sex: Treating RN: 1961-06-03 (59 y.o. Male) Levan Hurst Primary Care Mehek Grega: Leonard Bentley Other Clinician: Referring Dejanira Pamintuan: Treating Jayce Boyko/Extender: [place]ox Ponds Bentley in Treatment: 0 Primary Learner Assessed: Patient Learning Preferences/Education Level/Primary Language Learning Preference: Explanation, Demonstration, Printed Material Highest Education Level: College or Above Preferred Language: English Cognitive Barrier Language Barrier: No Translator Needed: No Memory Deficit: No Emotional Barrier: No Cultural/Religious Beliefs Affecting Medical Care: No Physical Barrier Impaired Vision: No Impaired Hearing: No Decreased Hand dexterity: No Knowledge/Comprehension Knowledge Level: High Comprehension Level: High Ability to understand written instructions: High Ability to understand verbal instructions: High Motivation Anxiety Level: Calm Cooperation: Cooperative Education Importance: Acknowledges Need Interest in Health Problems: Asks Questions Perception: Coherent Willingness to Engage in Self-Management High Activities: Readiness to Engage in Self-Management High Activities: Electronic Signature(s) Signed: 05/07/2020 5:49:00 PM By: Levan Hurst RN, BSN Entered By: Levan Hurst on 05/07/2020 11:13:22 -------------------------------------------------------------------------------- Fall Risk Assessment Details Patient Name: Date of Service: Leonard Dunning A. 05/07/2020 10:30 A M Medical Record Number: [id number] Patient Account Number: [id number] Date of Birth/Sex: Treating RN: [birth year] (59 y.o. Male) Levan Hurst Primary Care  Betsie Peckman: Leonard Bentley Other Clinician: Referring Laasia Arcos: Treating Kenidi Elenbaas/Extender: Leonard Bentley, Army Melia Bentley in Treatment: 0 Fall Risk Assessment Items Have you had 2 or more falls in the last 12 monthso 0 No Have you had any fall that resulted in injury in the last 12 monthso 0 No FALLS RISK SCREEN History of falling - immediate or within 3 months 0 No Secondary diagnosis (Do you have 2 or more medical diagnoseso) 15 Yes Ambulatory aid None/bed rest/wheelchair/nurse 0 No Crutches/cane/walker 15 Yes Furniture 0 No Intravenous therapy Access/Saline/Heparin [employer]  0 No Gait/Transferring Normal/ bed rest/ wheelchair 0 Yes Weak (short steps with or without shuffle, stooped but able to lift head while walking, may seek 0 No support from furniture) Impaired (short steps with shuffle, may have difficulty arising from chair, head down, impaired 0 No balance) Mental Status Oriented to own ability 0 Yes Electronic Signature(s) Signed: 05/07/2020 5:49:00 PM By: Levan Hurst RN, BSN Entered By: Levan Hurst on 05/07/2020 11:13:40 -------------------------------------------------------------------------------- Foot Assessment Details Patient Name: Date of Service: Leonard Dunning A. 05/07/2020 10:30 A M Medical Record Number: [id number] Patient Account Number: [id number] Date of Birth/Sex: Treating RN: [birth year] (59 y.o. Male) Levan Hurst Primary Care Donn Zanetti: Leonard Bentley Other Clinician: Referring Zareena Willis: Treating Casson Catena/Extender: Leonard Bentley, Leonard Bentley in Treatment: 0 Foot Assessment Items Site Locations + = Sensation present, - = Sensation absent, C = Callus, U = Ulcer R = Redness, W = Warmth, M = Maceration, PU = Pre-ulcerative lesion F = Fissure, S = Swelling, D = Dryness Assessment Right: Left: Other Deformity: No No Prior Foot Ulcer: No No Prior Amputation: No No Charcot Joint: No No Ambulatory Status: Ambulatory With  Help Assistance Device: Walker GaitEnergy manager) Signed: 05/07/2020 5:49:00 PM By: Levan Hurst RN, BSN Entered By: Levan Hurst on 05/07/2020 11:16:40 -------------------------------------------------------------------------------- Nutrition Risk Screening Details Patient Name: Date of Service: Leonard Dunning A. 05/07/2020 10:30 A M Medical Record Number: [id number] Patient Account Number: [id number] Date of Birth/Sex: Treating RN: 1961-07-16 (59 y.o. Male) Levan Hurst Primary Care Ronneisha Jett: Leonard Bentley Other Clinician: Referring Rayfield Beem: Treating Cleophas Yoak/Extender: Leonard Bentley, Leonard Bentley in Treatment: 0 Height (in): 69 Weight (lbs): 190 Body Mass Index (BMI): 28.1 Nutrition Risk Screening Items Score Screening NUTRITION RISK SCREEN: I have an illness or condition that made me change the kind and/or amount of food I eat 0 No I eat fewer than two meals per [place] 0 No I eat few fruits and vegetables, or milk products 0 No I have three or more drinks of beer, liquor or wine almost every [place] 0 No I have tooth or mouth problems that make it hard for me to eat 0 No I don't always have enough money to buy the food I need 0 No I eat alone most of the time 0 No I take three or more different prescribed or over-the-counter drugs a Shelp 1 Yes Without wanting to, I have lost or gained 10 pounds in the last six months 0 No I am not always physically able to shop, cook and/or feed myself 2 Yes Nutrition Protocols Good Risk Protocol Moderate Risk Protocol 0 Provide education on nutrition High Risk Proctocol Risk Level: Moderate Risk Score: 3 Electronic Signature(s) Signed: 05/07/2020 5:49:00 PM By: Levan Hurst RN, BSN Entered By: Levan Hurst on 05/07/2020 11:14:29

## 2020-05-07 NOTE — Progress Notes (Signed)
SALOMON, GANSER ([id number]) Visit Report for 05/07/2020 Chief Complaint Document Details Patient Name: Date of Service: Leonard Bentley 05/07/2020 10:30 A M Medical Record Number: [id number] Patient Account Number: [id number] Date of Birth/Sex: Treating RN: Dec 12, 1960 (59 y.o. Male) [name] Primary Care Provider: [name] Other Clinician: Referring Provider: Treating Provider/Extender: [name], Army Melia Weeks in Treatment: 0 Information Obtained from: Patient Chief Complaint Left foot surgical ulcer Electronic Signature(s) Signed: 05/07/2020 11:49:55 AM By: Worthy Keeler PA-C Entered By: Worthy Keeler on 05/07/2020 11:49:54 -------------------------------------------------------------------------------- Debridement Details Patient Name: Date of Service: Leonard Dunning A. 05/07/2020 10:30 A M Medical Record Number: [id number] Patient Account Number: [id number] Date of Birth/Sex: Treating RN: May 09, 1961 (59 y.o. Male) [name] Primary Care Provider: [name] Other Clinician: Referring Provider: Treating Provider/Extender: Lenox Ponds Weeks in Treatment: 0 Debridement Performed for Assessment: Wound #1 Left Amputation Site - Transmetatarsal Performed By: Physician Worthy Keeler, PA Debridement Type: Debridement Level of Consciousness (Pre-procedure): Awake and Alert Pre-procedure Verification/Time Out Yes - 11:55 Taken: Start Time: 11:58 Pain Control: Other : benzocaine 20% spray T Area Debrided (L x W): otal 5 (cm) x 9.1 (cm) = 45.5 (cm) Tissue and other material debrided: Viable, Non-Viable, Eschar, Slough, Subcutaneous, Slough Level: Skin/Subcutaneous Tissue Debridement Description: Excisional Instrument: Curette, Forceps, Scissors Bleeding: Minimum Hemostasis Achieved: Pressure End Time: 12:07 Procedural Pain: 4 Post Procedural Pain: 3 Response to Treatment: Procedure was tolerated well Level of Consciousness  (Post- Awake and Alert procedure): Post Debridement Measurements of Total Wound Length: (cm) 5 Width: (cm) 9.1 Depth: (cm) 3.5 Volume: (cm) 125.075 Character of Wound/Ulcer Post Debridement: Requires Further Debridement Post Procedure Diagnosis Same as Pre-procedure Electronic Signature(s) Signed: 05/07/2020 5:49:12 PM By: [name] RN, BSN Signed: 05/07/2020 5:59:30 PM By: Worthy Keeler PA-C Entered By: [name] on 05/07/2020 12:04:04 -------------------------------------------------------------------------------- HPI Details Patient Name: Date of Service: Leonard Dunning A. 05/07/2020 10:30 A M Medical Record Number: [id number] Patient Account Number: [id number] Date of Birth/Sex: Treating RN: 01-02-1961 (59 y.o. Male) [name] Primary Care Provider: [name] Other Clinician: Referring Provider: Treating Provider/Extender: [name], Army Melia Weeks in Treatment: 0 History of Present Illness HPI Description: 05/07/2020 upon evaluation today patient appears to be doing somewhat poorly in regard to a surgery site on his left foot which is a transmetatarsal amputation site. He actually had his second and third toes removed in March 2021. Unfortunately that did not heal appropriately and so he subsequently in May he had a transmetatarsal amputation performed by Dr. [name]. Subsequently this unfortunately also dehisced and though he still has sutures in place this has been about 6 weeks out and unfortunately the sutures are not holding anything together there is necrotic tissue that the sutures are actually embedded into and overall this really needs to be cleaned out and to be honest I think the patient really needs a wound VAC. With that being said there does not fortunately appear to be any signs of infection which is good news. Nonetheless I do believe this is definitely going to be a significant time to healing even with the wound VAC in order to get  this completely closed. There is a lot of depth to the wound through the necrotic tissue and again a lot of the necrotic tissue needs to be removed we may be able to do some of that today I am not thinking we will get everything cleaned out however. No fevers,  chills, nausea, vomiting, or diarrhea. The patient does have a history of nicotine dependence and we did discuss that in detail today but other than that he really has no major medical problems at this point other than what is going on with his feet. In fact it really has not even been a determination as to why he is developing the wounds and having trouble healing his blood flow is good he does however smoke which could be affecting things in my opinion. Electronic Signature(s) Signed: 05/07/2020 5:54:55 PM By: Worthy Keeler PA-C Entered By: Worthy Keeler on 05/07/2020 17:54:55 -------------------------------------------------------------------------------- Physical Exam Details Patient Name: Date of Service: Leonard Bentley 05/07/2020 10:30 A M Medical Record Number: [id number] Patient Account Number: [id number] Date of Birth/Sex: Treating RN: 07/10/1961 (59 y.o. Male) [name] Primary Care Provider: [name] Other Clinician: Referring Provider: Treating Provider/Extender: [name], [name] in Treatment: 0 Constitutional sitting or standing blood pressure is within target range for patient.. pulse regular and within target range for patient.Marland Kitchen respirations regular, non-labored and within target range for patient.Marland Kitchen temperature within target range for patient.. Well-nourished and well-hydrated in no acute distress. Eyes conjunctiva clear no eyelid edema noted. pupils equal round and reactive to light and accommodation. Ears, Nose, Mouth, and Throat no gross abnormality of ear auricles or external auditory canals. normal hearing noted during conversation. mucus membranes moist. Respiratory normal  breathing without difficulty. Cardiovascular 2+ dorsalis pedis/posterior tibialis pulses. Musculoskeletal Patient unable to walk without assistance As he is attempting to offload his foot he is actually been using a knee crutch which seems to be doing quite well for him this is keeping pressure off of his foot.. no significant deformity or arthritic changes, no loss or range of motion, no clubbing. Psychiatric this patient is able to make decisions and demonstrates good insight into disease process. Alert and Oriented x 3. pleasant and cooperative. Notes Upon inspection patient's wound bed actually showed signs of necrotic tissue noted on the surface of the wound this is a can require sharp debridement at this point to clear this away. I did remove sutures which were pretty much hanging in the wound and providing no function whatsoever at this point. I am not really sure why they were still left in place. The patient's been using Betadine which also really has no place in my opinion as far as trying to get this to heal at this time. Obviously that can help with infection as far as keeping everything nice and clean but again I really think he needs debridement and then subsequently we do need to initiate a wound VAC as soon as we can get something approved if were able to get this for him. Again this could be an insurance issue that may make it somewhat difficult. Electronic Signature(s) Signed: 05/07/2020 5:56:24 PM By: Worthy Keeler PA-C Entered By: Worthy Keeler on 05/07/2020 17:56:24 -------------------------------------------------------------------------------- Physician Orders Details Patient Name: Date of Service: Leonard Dunning A. 05/07/2020 10:30 A M Medical Record Number: [id number] Patient Account Number: [id number] Date of Birth/Sex: Treating RN: 01-12-1961 (59 y.o. Male) [name] Primary Care Provider: [name] Other Clinician: Referring Provider: Treating  Provider/Extender: [name], Army Melia Weeks in Treatment: 0 Verbal / Phone Orders: No Diagnosis Coding ICD-10 Coding Code Description S91.302A Unspecified open wound, left foot, initial encounter T81.31XA Disruption of external operation (surgical) wound, not elsewhere classified, initial encounter F17.218 Nicotine dependence, cigarettes, with other nicotine-induced disorders Follow-up  Appointments Return Appointment in 1 week. Dressing Change Frequency Wound #1 Left Amputation Site - Transmetatarsal Change dressing every [place]. Wound Cleansing Wound #1 Left Amputation Site - Transmetatarsal Clean wound with Wound Cleanser Primary Wound Dressing Wound #1 Left Amputation Site - Transmetatarsal Other: - Vashe or anasept moistened gauze packing Secondary Dressing Wound #1 Left Amputation Site - Transmetatarsal Kerlix/Rolled Gauze ABD pad Other: - ace wrap Negative Presssure Wound Therapy Wound Vac to wound continuously at 142mm/hg pressure - wound center to order Black Foam Edema Control Avoid standing for long periods of time Elevate legs to the level of the heart or above for 30 minutes daily and/or when sitting, a frequency of: Off-Loading Other: - minimal weight bearing left foot Electronic Signature(s) Signed: 05/07/2020 5:49:12 PM By: [name] RN, BSN Signed: 05/07/2020 5:59:30 PM By: Worthy Keeler PA-C Entered By: [name] on 05/07/2020 12:10:40 -------------------------------------------------------------------------------- Problem List Details Patient Name: Date of Service: Leonard Dunning A. 05/07/2020 10:30 A M Medical Record Number: [id number] Patient Account Number: [id number] Date of Birth/Sex: Treating RN: 1961/03/21 (59 y.o. Male) [name] Primary Care Provider: [name] Other Clinician: Referring Provider: Treating Provider/Extender: [name], Army Melia Weeks in Treatment: 0 Active  Problems ICD-10 Encounter Code Description Active Date MDM Diagnosis S91.302A Unspecified open wound, left foot, initial encounter 05/07/2020 No Yes T81.31XA Disruption of external operation (surgical) wound, not elsewhere classified, 05/07/2020 No Yes initial encounter F17.218 Nicotine dependence, cigarettes, with other nicotine-induced disorders 05/07/2020 No Yes Inactive Problems Resolved Problems Electronic Signature(s) Signed: 05/07/2020 11:49:30 AM By: Worthy Keeler PA-C Entered By: Worthy Keeler on 05/07/2020 11:49:29 -------------------------------------------------------------------------------- Progress Note Details Patient Name: Date of Service: Leonard Dunning A. 05/07/2020 10:30 A M Medical Record Number: [id number] Patient Account Number: [id number] Date of Birth/Sex: Treating RN: 19-Feb-1961 (59 y.o. Male) [name] Primary Care Provider: [name] Other Clinician: Referring Provider: Treating Provider/Extender: Lenox Ponds Weeks in Treatment: 0 Subjective Chief Complaint Information obtained from Patient Left foot surgical ulcer History of Present Illness (HPI) 05/07/2020 upon evaluation today patient appears to be doing somewhat poorly in regard to a surgery site on his left foot which is a transmetatarsal amputation site. He actually had his second and third toes removed in March 2021. Unfortunately that did not heal appropriately and so he subsequently in May he had a transmetatarsal amputation performed by Dr. [name]. Subsequently this unfortunately also dehisced and though he still has sutures in place this has been about 6 weeks out and unfortunately the sutures are not holding anything together there is necrotic tissue that the sutures are actually embedded into and overall this really needs to be cleaned out and to be honest I think the patient really needs a wound VAC. With that being said there does not fortunately appear to be  any signs of infection which is good news. Nonetheless I do believe this is definitely going to be a significant time to healing even with the wound VAC in order to get this completely closed. There is a lot of depth to the wound through the necrotic tissue and again a lot of the necrotic tissue needs to be removed we may be able to do some of that today I am not thinking we will get everything cleaned out however. No fevers, chills, nausea, vomiting, or diarrhea. The patient does have a history of nicotine dependence and we did discuss that in detail today but other than that he really has no  major medical problems at this point other than what is going on with his feet. In fact it really has not even been a determination as to why he is developing the wounds and having trouble healing his blood flow is good he does however smoke which could be affecting things in my opinion. Patient History Information obtained from Patient. Allergies No Known Drug Allergies Family History Cancer - Mother,Father, Heart Disease - Maternal Grandparents, Hypertension - Maternal Grandparents, No family history of Diabetes, Hereditary Spherocytosis, Kidney Disease, Lung Disease, Seizures, Stroke, Thyroid Problems, Tuberculosis. Social History Current every Credit smoker - 1/2 pack per [place], Marital Status - Divorced, Alcohol Use - Rarely, Drug Use - No History, Caffeine Use - Daily. Medical History Hematologic/Lymphatic Patient has history of Anemia Cardiovascular Patient has history of Arrhythmia - PAC, Congestive Heart Failure, Hypertension Musculoskeletal Patient has history of Osteoarthritis, Osteomyelitis - left foot/toes Neurologic Patient has history of Neuropathy Oncologic Patient has history of Received Chemotherapy Medical A Surgical History Notes nd Respiratory Hx pulmonary emboli Cardiovascular Mitral valve prolapse Gastrointestinal alcohol induced hepatitis Oncologic Non hodgkins lymphoma  2012 Review of Systems (ROS) Constitutional Symptoms (General Health) Denies complaints or symptoms of Fatigue, Fever, Chills, Marked Weight Change. Eyes Denies complaints or symptoms of Dry Eyes, Vision Changes, Glasses / Contacts. Ear/Nose/Mouth/Throat Denies complaints or symptoms of Chronic sinus problems or rhinitis. Respiratory Denies complaints or symptoms of Chronic or frequent coughs, Shortness of Breath. Endocrine Denies complaints or symptoms of Heat/cold intolerance. Genitourinary Denies complaints or symptoms of Frequent urination. Integumentary (Skin) Complains or has symptoms of Wounds - left transmet amputation. Psychiatric Denies complaints or symptoms of Claustrophobia, Suicidal. Objective Constitutional sitting or standing blood pressure is within target range for patient.. pulse regular and within target range for patient.Marland Kitchen respirations regular, non-labored and within target range for patient.Marland Kitchen temperature within target range for patient.. Well-nourished and well-hydrated in no acute distress. Vitals Time Taken: 10:51 AM, Height: 69 in, Source: Stated, Weight: 190 lbs, Source: Stated, BMI: 28.1, Temperature: 97.7 F, Pulse: 81 bpm, Respiratory Rate: 18 breaths/min, Blood Pressure: 138/95 mmHg. Eyes conjunctiva clear no eyelid edema noted. pupils equal round and reactive to light and accommodation. Ears, Nose, Mouth, and Throat no gross abnormality of ear auricles or external auditory canals. normal hearing noted during conversation. mucus membranes moist. Respiratory normal breathing without difficulty. Cardiovascular 2+ dorsalis pedis/posterior tibialis pulses. Musculoskeletal Patient unable to walk without assistance As he is attempting to offload his foot he is actually been using a knee crutch which seems to be doing quite well for him this is keeping pressure off of his foot.. no significant deformity or arthritic changes, no loss or range of motion, no  clubbing. Psychiatric this patient is able to make decisions and demonstrates good insight into disease process. Alert and Oriented x 3. pleasant and cooperative. General Notes: Upon inspection patient's wound bed actually showed signs of necrotic tissue noted on the surface of the wound this is a can require sharp debridement at this point to clear this away. I did remove sutures which were pretty much hanging in the wound and providing no function whatsoever at this point. I am not really sure why they were still left in place. The patient's been using Betadine which also really has no place in my opinion as far as trying to get this to heal at this time. Obviously that can help with infection as far as keeping everything nice and clean but again I really think he needs debridement and then  subsequently we do need to initiate a wound VAC as soon as we can get something approved if were able to get this for him. Again this could be an insurance issue that may make it somewhat difficult. Integumentary (Hair, Skin) Wound #1 status is Open. Original cause of wound was Surgical Injury. The wound is located on the Left Amputation Site - Transmetatarsal. The wound measures 5cm length x 9.1cm width x 3.5cm depth; 35.736cm^2 area and 125.075cm^3 volume. There is bone and Fat Layer (Subcutaneous Tissue) Exposed exposed. There is no tunneling or undermining noted. There is a medium amount of serosanguineous drainage noted. The wound margin is distinct with the outline attached to the wound base. There is small (1-33%) pink granulation within the wound bed. There is a large (67-100%) amount of necrotic tissue within the wound bed including Eschar and Adherent Slough. General Notes: 7 sutures present Assessment Active Problems ICD-10 Unspecified open wound, left foot, initial encounter Disruption of external operation (surgical) wound, not elsewhere classified, initial encounter Nicotine dependence,  cigarettes, with other nicotine-induced disorders Procedures Wound #1 Pre-procedure diagnosis of Wound #1 is a Dehisced Wound located on the Left Amputation Site - Transmetatarsal . There was a Excisional Skin/Subcutaneous Tissue Debridement with a total area of 45.5 sq cm performed by Worthy Keeler, PA. With the following instrument(s): Curette, Forceps, and Scissors to remove Viable and Non-Viable tissue/material. Material removed includes Eschar, Subcutaneous Tissue, and Slough after achieving pain control using Other (benzocaine 20% spray). No specimens were taken. A time out was conducted at 11:55, prior to the start of the procedure. A Minimum amount of bleeding was controlled with Pressure. The procedure was tolerated well with a pain level of 4 throughout and a pain level of 3 following the procedure. Post Debridement Measurements: 5cm length x 9.1cm width x 3.5cm depth; 125.075cm^3 volume. Character of Wound/Ulcer Post Debridement requires further debridement. Post procedure Diagnosis Wound #1: Same as Pre-Procedure Plan Follow-up Appointments: Return Appointment in 1 week. Dressing Change Frequency: Wound #1 Left Amputation Site - Transmetatarsal: Change dressing every [place]. Wound Cleansing: Wound #1 Left Amputation Site - Transmetatarsal: Clean wound with Wound Cleanser Primary Wound Dressing: Wound #1 Left Amputation Site - Transmetatarsal: Other: - Vashe or anasept moistened gauze packing Secondary Dressing: Wound #1 Left Amputation Site - Transmetatarsal: Kerlix/Rolled Gauze ABD pad Other: - ace wrap Negative Presssure Wound Therapy: Wound Vac to wound continuously at 134mm/hg pressure - wound center to order Black Foam Edema Control: Avoid standing for long periods of time Elevate legs to the level of the heart or above for 30 minutes daily and/or when sitting, a frequency of: Off-Loading: Other: - minimal weight bearing left foot 1. My suggestion at this time is  good be that we go ahead and initiate treatment with a initial step of Vioxx moistened gauze packed into the wound bed. This I think will help to clean things out to some degree and hopefully make the wound VAC more appropriate as far as getting things down to good tissue so that we can get this to granulate in. 2. We are going to see about ordering a wound VAC since the patient does not really have insurance to cover this currently we are hoping that we may be able to work through [facility] to get a [facility] for him through their charity program. I think that this can be beneficial for the patient and honestly if we can get that approved that would be awesome. 3. With regard to weightbearing I  think the using his knee crutch is a good way to keep pressure off of the area I think that is appropriate and I would recommend that he continue as such. 4. With regard to smoking I did have a fairly lengthy conversation with the patient about smoking cessation I think this is something that he needs to consider is one of the biggest things he can do for himself in order to help this to heal and prevent new areas from occurring. He voiced an understanding he did make a commitment as of yet but hopefully this did put the thought in his mind that he really needs to stop smoking. We will see patient back for reevaluation in 1 week here in the clinic. If anything worsens or changes patient will contact our office for additional recommendations. Electronic Signature(s) Signed: 05/07/2020 5:58:16 PM By: Worthy Keeler PA-C Entered By: Worthy Keeler on 05/07/2020 17:58:16 -------------------------------------------------------------------------------- HxROS Details Patient Name: Date of Service: DA Briant Cedar A. 05/07/2020 10:30 A M Medical Record Number: [id number] Patient Account Number: [id number] Date of Birth/Sex: Treating RN: June 17, 1961 (59 y.o. Male) Levan Hurst Primary Care Provider: [name] Other  Clinician: Referring Provider: Treating Provider/Extender: [name], [name] in Treatment: 0 Information Obtained From Patient Constitutional Symptoms (General Health) Complaints and Symptoms: Negative for: Fatigue; Fever; Chills; Marked Weight Change Eyes Complaints and Symptoms: Negative for: Dry Eyes; Vision Changes; Glasses / Contacts Ear/Nose/Mouth/Throat Complaints and Symptoms: Negative for: Chronic sinus problems or rhinitis Respiratory Complaints and Symptoms: Negative for: Chronic or frequent coughs; Shortness of Breath Medical History: Past Medical History Notes: Hx pulmonary emboli Endocrine Complaints and Symptoms: Negative for: Heat/cold intolerance Genitourinary Complaints and Symptoms: Negative for: Frequent urination Integumentary (Skin) Complaints and Symptoms: Positive for: Wounds - left transmet amputation Psychiatric Complaints and Symptoms: Negative for: Claustrophobia; Suicidal Hematologic/Lymphatic Medical History: Positive for: Anemia Cardiovascular Medical History: Positive for: Arrhythmia - PAC; Congestive Heart Failure; Hypertension Past Medical History Notes: Mitral valve prolapse Gastrointestinal Medical History: Past Medical History Notes: alcohol induced hepatitis Immunological Musculoskeletal Medical History: Positive for: Osteoarthritis; Osteomyelitis - left foot/toes Neurologic Medical History: Positive for: Neuropathy Oncologic Medical History: Positive for: Received Chemotherapy Past Medical History Notes: Non hodgkins lymphoma 2012 Immunizations Pneumococcal Vaccine: Received Pneumococcal Vaccination: No Implantable Devices None Family and Social History Cancer: Yes - Mother,Father; Diabetes: No; Heart Disease: Yes - Maternal Grandparents; Hereditary Spherocytosis: No; Hypertension: Yes - Maternal Grandparents; Kidney Disease: No; Lung Disease: No; Seizures: No; Stroke: No; Thyroid Problems: No;  Tuberculosis: No; Current every [place] smoker - 1/2 pack per [place]; Marital Status - Divorced; Alcohol Use: Rarely; Drug Use: No History; Caffeine Use: Daily; Financial Concerns: No; Food, Clothing or Shelter Needs: No; Support System Lacking: No; Transportation Concerns: No Engineer, maintenance) Signed: 05/07/2020 5:49:00 PM By: Levan Hurst RN, BSN Signed: 05/07/2020 5:59:30 PM By: Worthy Keeler PA-C Entered By: Levan Hurst on 05/07/2020 11:12:27 -------------------------------------------------------------------------------- Learned Details Patient Name: Date of Service: Leonard Dunning A. 05/07/2020 Medical Record Number: [id number] Patient Account Number: [id number] Date of Birth/Sex: Treating RN: 05/27/1961 (59 y.o. Male) [name] Primary Care Provider: [name] Other Clinician: Referring Provider: Treating Provider/Extender: [name], Army Melia Weeks in Treatment: 0 Diagnosis Coding ICD-10 Codes Code Description [phone] Unspecified open wound, left foot, initial encounter T81.31XA Disruption of external operation (surgical) wound, not elsewhere classified, initial encounter F17.218 Nicotine dependence, cigarettes, with other nicotine-induced disorders Facility Procedures CPT4 Code: 66063016 Description: 99213 - WOUND CARE VISIT-LEV 3 EST PT  Modifier: 25 Quantity: 1 CPT4 Code: 81829937 Description: 16967 - DEB SUBQ TISSUE 20 SQ CM/< ICD-10 Diagnosis Description E93.810F Unspecified open wound, left foot, initial encounter Modifier: Quantity: 1 CPT4 Code: 75102585 Description: 27782 - DEB SUBQ TISS EA ADDL 20CM ICD-10 Diagnosis Description U23.536R Unspecified open wound, left foot, initial encounter Modifier: Quantity: 2 CPT4 Code: 44315400 Description: 99406-SMOKING CESSATION 3-10MINS ICD-10 Diagnosis Description F17.218 Nicotine dependence, cigarettes, with other nicotine-induced disorders Modifier: Quantity: 1 Physician Procedures : CPT4  Code Description Modifier [id number] 99204 - WC PHYS LEVEL 4 - NEW PT 25 ICD-10 Diagnosis Description S91.302A Unspecified open wound, left foot, initial encounter T81.31XA Disruption of external operation (surgical) wound, not elsewhere classified,  initial encounter F17.218 Nicotine dependence, cigarettes, with other nicotine-induced disorders Quantity: 1 : 0932671 24580 - WC PHYS SUBQ TISS 20 SQ CM ICD-10 Diagnosis Description S91.302A Unspecified open wound, left foot, initial encounter Quantity: 1 : 9983382 50539 - WC PHYS SUBQ TISS EA ADDL 20 CM ICD-10 Diagnosis Description S91.302A Unspecified open wound, left foot, initial encounter Quantity: 2 : 99406 99406- SMOKING CESSATION 3-10 MINS ICD-10 Diagnosis Description F17.218 Nicotine dependence, cigarettes, with other nicotine-induced disorders Quantity: 1 Electronic Signature(s) Signed: 05/07/2020 5:58:44 PM By: Worthy Keeler PA-C Previous Signature: 05/07/2020 5:49:12 PM Version By: [name] RN, BSN Entered By: Worthy Keeler on 05/07/2020 17:58:44

## 2020-05-07 NOTE — Progress Notes (Signed)
  Subjective: 59 year old male presents the office for follow up evaluation after undergoing transmetatarsal amputation resulting dehiscence and necrotic tissue.  Is had multiple surgeries performed by Dr. [name] and he did follow-up with the wound care center today.  They have changed the dressing they did debride the wound today.  Currently denies any fevers, chills, nausea, vomiting, chest pain, shortness of breath.  Objective: AAO x3, NAD DP/PT pulses palpable bilaterally, CRT less than 3 seconds Status post transmetatarsal amputation with sutures intact.  Necrotic, fibrotic tissue was evident.  The wound today measures 9.5 x 5 x 2.3 cm but any probing to bone.  There is fibro necrotic tissue with slight granulation tissue.  There is no probing to bone, undermining or tunneling.  There is no erythema, increased.  There is no swelling to the foot. There is no areas of fluctuation crepitation.  No pain with calf compression, swelling, warmth, erythema  Assessment: Transmetatarsal amputation with dehiscence  Plan: -All treatment options discussed with the patient including all alternatives, risks, complications.  -She went to the wound care center today where it was debrided.  At this time we will continue local wound care I will defer the management to the wound care center at this time.  As of now hold off on any surgery grafting.  I will see him back as needed but please call any questions or concerns.  Trula Slade DPM

## 2020-05-07 NOTE — Progress Notes (Signed)
Leonard Bentley ([id number]) Visit Report for 05/07/2020 Allergy List Details Patient Name: Date of Service: Leonard Bentley 05/07/2020 10:30 A M Medical Record Number: [id number] Patient Account Number: [id number] Date of Birth/Sex: Treating RN: 1961/04/28 (59 y.o. Male) Levan Hurst Primary Care Ashtyn Freilich: [name] Other Clinician: Referring Yarexi Pawlicki: Treating Megen Madewell/Extender: [name], [name] in Treatment: 0 Allergies Active Allergies No Known Drug Allergies Allergy Notes Electronic Signature(s) Signed: 05/07/2020 5:49:00 PM By: Levan Hurst RN, BSN Entered By: Levan Hurst on 05/07/2020 10:58:52 -------------------------------------------------------------------------------- Arrival Information Details Patient Name: Date of Service: Leonard Dunning A. 05/07/2020 10:30 A M Medical Record Number: [id number] Patient Account Number: [id number] Date of Birth/Sex: Treating RN: 1961/01/18 (59 y.o. Male) Levan Hurst Primary Care Shalisa Mcquade: [name] Other Clinician: Referring Sindhu Nguyen: Treating Drae Mitzel/Extender: Lenox Ponds Weeks in Treatment: 0 Visit Information Patient Arrived: Walker Arrival Time: 10:49 Accompanied By: alone Transfer Assistance: None Patient Identification Verified: Yes Secondary Verification Process Completed: Yes Patient Requires Transmission-Based Precautions: No Patient Has Alerts: Yes Patient Alerts: Patient on Blood Thinner L ABI: 1.03 (01/2020) Electronic Signature(s) Signed: 05/07/2020 5:49:00 PM By: Levan Hurst RN, BSN Entered By: Levan Hurst on 05/07/2020 11:01:02 -------------------------------------------------------------------------------- Clinic Level of Care Assessment Details Patient Name: Date of Service: Leonard Dunning A. 05/07/2020 10:30 A M Medical Record Number: [id number] Patient Account Number: [id number] Date of Birth/Sex: Treating RN: 12-23-1960 (59 y.o. Male) [name] Primary Care Shandreka Dante: [name] Other Clinician: Referring Natajah Derderian: Treating Bailey Kolbe/Extender: [name], [name] in Treatment: 0 Clinic Level of Care Assessment Items TOOL 1 Quantity Score []  - 0 Use when EandM and Procedure is performed on INITIAL visit ASSESSMENTS - Nursing Assessment / Reassessment X- 1 20 General Physical Exam (combine w/ comprehensive assessment (listed just below) when performed on new pt. evals) X- 1 25 Comprehensive Assessment (HX, ROS, Risk Assessments, Wounds Hx, etc.) ASSESSMENTS - Wound and Skin Assessment / Reassessment []  - 0 Dermatologic / Skin Assessment (not related to wound area) ASSESSMENTS - Ostomy and/or Continence Assessment and Care []  - 0 Incontinence Assessment and Management []  - 0 Ostomy Care Assessment and Management (repouching, etc.) PROCESS - Coordination of Care X - Simple Patient / Family Education for ongoing care 1 15 []  - 0 Complex (extensive) Patient / Family Education for ongoing care X- 1 10 Staff obtains Programmer, systems, Records, T Results / Process Orders est []  - 0 Staff telephones HHA, Nursing Homes / Clarify orders / etc []  - 0 Routine Transfer to another Facility (non-emergent condition) []  - 0 Routine Hospital Admission (non-emergent condition) X- 1 15 New Admissions / Biomedical engineer / Ordering NPWT Apligraf, etc. , []  - 0 Emergency Hospital Admission (emergent condition) PROCESS - Special Needs []  - 0 Pediatric / Minor Patient Management []  - 0 Isolation Patient Management []  - 0 Hearing / Language / Visual special needs []  - 0 Assessment of Community assistance (transportation, D/C planning, etc.) []  - 0 Additional assistance / Altered mentation []  - 0 Support Surface(s) Assessment (bed, cushion, seat, etc.) INTERVENTIONS - Miscellaneous []  - 0 External ear exam []  - 0 Patient Transfer (multiple staff / Civil Service fast streamer / Similar devices) []  - 0 Simple Staple /  Suture removal (25 or less) []  - 0 Complex Staple / Suture removal (26 or more) []  - 0 Hypo/Hyperglycemic Management (do not check if billed separately) []  - 0 Ankle / Brachial Index (ABI) - do not check if billed separately Has the patient  been seen at the hospital within the last three years: Yes Total Score: 85 Level Of Care: New/Established - Level 3 Electronic Signature(s) Signed: 05/07/2020 5:49:12 PM By: [name] RN, BSN Entered By: [name] on 05/07/2020 11:54:38 -------------------------------------------------------------------------------- Encounter Discharge Information Details Patient Name: Date of Service: Leonard Dunning A. 05/07/2020 10:30 A M Medical Record Number: [id number] Patient Account Number: [id number] Date of Birth/Sex: Treating RN: 12/07/1960 (59 y.o. Male) Carlene Coria Primary Care Monte Bronder: [name] Other Clinician: Referring Jetaime Pinnix: Treating [name]/Extender: Lenox Ponds Weeks in Treatment: 0 Encounter Discharge Information Items Post Procedure Vitals Discharge Condition: Stable Temperature (F): 97.7 Ambulatory Status: Walker Pulse (bpm): 81 Discharge Destination: Home Respiratory Rate (breaths/min): 18 Transportation: Private Auto Blood Pressure (mmHg): 138/95 Accompanied By: self Schedule Follow-up Appointment: Yes Clinical Summary of Care: Patient Declined Electronic Signature(s) Signed: 05/07/2020 5:31:10 PM By: Carlene Coria RN Entered By: Carlene Coria on 05/07/2020 12:34:34 -------------------------------------------------------------------------------- Lower Extremity Assessment Details Patient Name: Date of Service: Leonard Bentley 05/07/2020 10:30 A M Medical Record Number: [id number] Patient Account Number: [id number] Date of Birth/Sex: Treating RN: 1961-03-04 (59 y.o. Male) Levan Hurst Primary Care Foy Vanduyne: [name] Other Clinician: Referring Taegan Haider: Treating Katalin Colledge/Extender:  [name], [name] in Treatment: 0 Edema Assessment Assessed: [Left: No] [Right: No] Edema: [Left: Ye] [Right: s] Calf Left: Right: Point of Measurement: 31 cm From Medial Instep 31 cm cm Ankle Left: Right: Point of Measurement: 11 cm From Medial Instep 27 cm cm Vascular Assessment Pulses: Dorsalis Pedis Palpable: [Left:Yes] Electronic Signature(s) Signed: 05/07/2020 5:49:00 PM By: Levan Hurst RN, BSN Entered By: Levan Hurst on 05/07/2020 11:18:45 -------------------------------------------------------------------------------- [place] Details Patient Name: Date of Service: Leonard Dunning A. 05/07/2020 10:30 A M Medical Record Number: [id number] Patient Account Number: [id number] Date of Birth/Sex: Treating RN: 20-May-1961 (59 y.o. Male) [name] Primary Care Kullen Tomasetti: [name] Other Clinician: Referring Roselia Snipe: Treating Jeanpaul Biehl/Extender: [name], Army Melia Weeks in Treatment: 0 Active Inactive Abuse / Safety / Falls / Self Care Management Nursing Diagnoses: Potential for falls Goals: Patient/caregiver will verbalize/demonstrate measures taken to prevent injury and/or falls Date Initiated: 05/07/2020 Target Resolution Date: 06/04/2020 Goal Status: Active Interventions: Assess fall risk on admission and as needed Assess impairment of mobility on admission and as needed per policy Notes: Necrotic Tissue Nursing Diagnoses: Impaired tissue integrity related to necrotic/devitalized tissue Knowledge deficit related to management of necrotic/devitalized tissue Goals: Necrotic/devitalized tissue will be minimized in the wound bed Date Initiated: 05/07/2020 Target Resolution Date: 06/04/2020 Goal Status: Active Patient/caregiver will verbalize understanding of reason and process for debridement of necrotic tissue Date Initiated: 05/07/2020 Target Resolution Date: 06/04/2020 Goal Status:  Active Interventions: Assess patient pain level pre-, during and post procedure and prior to discharge Provide education on necrotic tissue and debridement process Treatment Activities: Apply topical anesthetic as ordered : 05/07/2020 Biologic debridement : 05/07/2020 Excisional debridement : 05/07/2020 Notes: Wound/Skin Impairment Nursing Diagnoses: Impaired tissue integrity Knowledge deficit related to smoking impact on wound healing Knowledge deficit related to ulceration/compromised skin integrity Goals: Patient will demonstrate a reduced rate of smoking or cessation of smoking Date Initiated: 05/07/2020 Target Resolution Date: 06/04/2020 Goal Status: Active Patient/caregiver will verbalize understanding of skin care regimen Date Initiated: 05/07/2020 Target Resolution Date: 06/04/2020 Goal Status: Active Ulcer/skin breakdown will have a volume reduction of 30% by week 4 Date Initiated: 05/07/2020 Target Resolution Date: 06/04/2020 Goal Status: Active Interventions: Assess patient/caregiver ability to obtain necessary supplies Assess patient/caregiver ability  to perform ulcer/skin care regimen upon admission and as needed Assess ulceration(s) every visit Provide education on ulcer and skin care Treatment Activities: Skin care regimen initiated : 05/07/2020 Topical wound management initiated : 05/07/2020 Notes: Electronic Signature(s) Signed: 05/07/2020 5:49:12 PM By: [name] RN, BSN Entered By: [name] on 05/07/2020 11:51:54 -------------------------------------------------------------------------------- Pain Assessment Details Patient Name: Date of Service: Leonard Dunning A. 05/07/2020 10:30 A M Medical Record Number: [id number] Patient Account Number: [id number] Date of Birth/Sex: Treating RN: Apr 08, 1961 (59 y.o. Male) Levan Hurst Primary Care Nai Dasch: [name] Other Clinician: Referring Ryder Chesmore: Treating Etana Beets/Extender: [name],  [name] in Treatment: 0 Active Problems Location of Pain Severity and Description of Pain Patient Has Paino No Site Locations Pain Management and Medication Current Pain Management: Electronic Signature(s) Signed: 05/07/2020 5:49:00 PM By: Levan Hurst RN, BSN Entered By: Levan Hurst on 05/07/2020 11:22:46 -------------------------------------------------------------------------------- Patient/Caregiver Education Details Patient Name: Date of Service: Leonard Bentley 6/30/2021andnbsp10:30 A M Medical Record Number: [id number] Patient Account Number: [id number] Date of Birth/Gender: Treating RN: 1961/06/28 (59 y.o. Male) [name] Primary Care Physician: [name] Other Clinician: Referring Physician: Treating Physician/Extender: Noni Saupe in Treatment: 0 Education Assessment Education Provided To: Patient Education Topics Provided Smoking and Wound Healing: Wound Debridement: Handouts: Wound Debridement Methods: Explain/Verbal, Printed Responses: Reinforcements needed, State content correctly Wound/Skin Impairment: Handouts: Caring for Your Ulcer, Skin Care Do's and Dont's, Smoking and Wound Healing Methods: Explain/Verbal, Printed Responses: Reinforcements needed, State content correctly Electronic Signature(s) Signed: 05/07/2020 5:49:12 PM By: [name] RN, BSN Entered By: [name] on 05/07/2020 11:52:59 -------------------------------------------------------------------------------- Wound Assessment Details Patient Name: Date of Service: Leonard Dunning A. 05/07/2020 10:30 A M Medical Record Number: [id number] Patient Account Number: [id number] Date of Birth/Sex: Treating RN: 29-Aug-1961 (59 y.o. Male) Levan Hurst Primary Care Kwabena Strutz: [name] Other Clinician: Referring Ramonda Galyon: Treating Djeneba Barsch/Extender: [name], [name] in Treatment: 0 Wound Status Wound Number: 1 Primary  Dehisced Wound Etiology: Wound Location: Left Amputation Site - Transmetatarsal Wound Open Wounding Event: Surgical Injury Status: Date Acquired: 03/21/2020 Comorbid Anemia, Arrhythmia, Congestive Heart Failure, Hypertension, Weeks Of Treatment: 0 History: Osteoarthritis, Osteomyelitis, Neuropathy, Received Clustered Wound: No Chemotherapy Photos Photo Uploaded By: Mikeal Hawthorne on 05/07/2020 14:32:27 Wound Measurements Length: (cm) 5 Width: (cm) 9.1 Depth: (cm) 3.5 Area: (cm) 35.736 Volume: (cm) 125.075 % Reduction in Area: 0% % Reduction in Volume: 0% Epithelialization: None Tunneling: No Undermining: No Wound Description Classification: Full Thickness With Exposed Support Structures Wound Margin: Distinct, outline attached Exudate Amount: Medium Exudate Type: Serosanguineous Exudate Color: red, brown Foul Odor After Cleansing: No Slough/Fibrino Yes Wound Bed Granulation Amount: Small (1-33%) Exposed Structure Granulation Quality: Pink Fascia Exposed: No Necrotic Amount: Large (67-100%) Fat Layer (Subcutaneous Tissue) Exposed: Yes Necrotic Quality: Eschar, Adherent Slough Tendon Exposed: No Muscle Exposed: No Joint Exposed: No Bone Exposed: Yes Assessment Notes 7 sutures present Treatment Notes Wound #1 (Left Amputation Site - Transmetatarsal) 1. Cleanse With Wound Cleanser Soap and water 3. Primary Dressing Applied Other primary dressing (specifiy in notes) 4. Secondary Dressing ABD Pad Roll Gauze 5. Secured With Other (specify in notes) Notes anacept moist gauze packing , ace wrap Electronic Signature(s) Signed: 05/07/2020 5:49:00 PM By: Levan Hurst RN, BSN Entered By: Levan Hurst on 05/07/2020 11:22:36 -------------------------------------------------------------------------------- [place] Details Patient Name: Date of Service: Leonard Dunning A. 05/07/2020 10:30 A M Medical Record Number: [id number] Patient Account Number: [id number] Date of  Birth/Sex: Treating RN:  19-Aug-1961 (59 y.o. Male) Levan Hurst Primary Care Sunjai Levandoski: [name] Other Clinician: Referring Kiersten Coss: Treating Naftuli Dalsanto/Extender: [name], [name] in Treatment: 0 Vital Signs Time Taken: 10:51 Temperature (F): 97.7 Height (in): 69 Pulse (bpm): 81 Source: Stated Respiratory Rate (breaths/min): 18 Weight (lbs): 190 Blood Pressure (mmHg): 138/95 Source: Stated Reference Range: 80 - 120 mg / dl Body Mass Index (BMI): 28.1 Electronic Signature(s) Signed: 05/07/2020 5:49:00 PM By: Levan Hurst RN, BSN Signed: 05/07/2020 5:49:00 PM By: Levan Hurst RN, BSN Entered By: Levan Hurst on 05/07/2020 10:52:25

## 2020-05-13 ENCOUNTER — Encounter (HOSPITAL_BASED_OUTPATIENT_CLINIC_OR_DEPARTMENT_OTHER): Payer: MEDICAID | Admitting: Internal Medicine

## 2020-05-14 ENCOUNTER — Telehealth: Payer: Self-pay | Admitting: Nurse Practitioner

## 2020-05-14 ENCOUNTER — Encounter (HOSPITAL_BASED_OUTPATIENT_CLINIC_OR_DEPARTMENT_OTHER): Payer: Medicaid Other | Attending: Physician Assistant | Admitting: Physician Assistant

## 2020-05-14 DIAGNOSIS — T8131XA Disruption of external operation (surgical) wound, not elsewhere classified, initial encounter: Secondary | ICD-10-CM | POA: Insufficient documentation

## 2020-05-14 DIAGNOSIS — S91302A Unspecified open wound, left foot, initial encounter: Secondary | ICD-10-CM | POA: Insufficient documentation

## 2020-05-14 DIAGNOSIS — Z7901 Long term (current) use of anticoagulants: Secondary | ICD-10-CM | POA: Diagnosis not present

## 2020-05-14 DIAGNOSIS — Z86711 Personal history of pulmonary embolism: Secondary | ICD-10-CM | POA: Insufficient documentation

## 2020-05-14 DIAGNOSIS — X58XXXA Exposure to other specified factors, initial encounter: Secondary | ICD-10-CM | POA: Diagnosis not present

## 2020-05-14 DIAGNOSIS — F17218 Nicotine dependence, cigarettes, with other nicotine-induced disorders: Secondary | ICD-10-CM | POA: Insufficient documentation

## 2020-05-14 MED ORDER — WARFARIN SODIUM 5 MG PO TABS: ORAL_TABLET | ORAL | 0 refills | Status: DC

## 2020-05-14 NOTE — Progress Notes (Addendum)
PONCIANO, SHEALY ([id number]) Visit Report for 05/14/2020 Chief Complaint Document Details Patient Name: Date of Service: Leonard Bentley 05/14/2020 12:30 PM Medical Record Number: [id number] Patient Account Number: [id number] Date of Birth/Sex: Treating RN: 05/08/1961 (59 y.o. [name] Primary Care Provider: [name] Other Clinician: Referring Provider: Treating Provider/Extender: [name], Army Melia Weeks in Treatment: 1 Information Obtained from: Patient Chief Complaint Left foot surgical ulcer Electronic Signature(s) Signed: 05/14/2020 1:05:19 PM By: Worthy Keeler PA-C Entered By: Worthy Keeler on 05/14/2020 13:05:19 -------------------------------------------------------------------------------- Debridement Details Patient Name: Date of Service: Leonard Dunning A. 05/14/2020 12:30 PM Medical Record Number: [id number] Patient Account Number: [id number] Date of Birth/Sex: Treating RN: 1961-10-10 (59 y.o. [name] Primary Care Provider: [name] Other Clinician: Referring Provider: Treating Provider/Extender: Lenox Ponds Weeks in Treatment: 1 Debridement Performed for Assessment: Wound #1 Left Amputation Site - Transmetatarsal Performed By: Physician Worthy Keeler, PA Debridement Type: Debridement Level of Consciousness (Pre-procedure): Awake and Alert Pre-procedure Verification/Time Out Yes - 13:30 Taken: Start Time: 13:32 Pain Control: Other : benzocaine 20% spray T Area Debrided (L x W): otal 3 (cm) x 5 (cm) = 15 (cm) Tissue and other material debrided: Viable, Non-Viable, Slough, Subcutaneous, Slough Level: Skin/Subcutaneous Tissue Debridement Description: Excisional Instrument: Curette Bleeding: Minimum Hemostasis Achieved: Pressure End Time: 13:37 Procedural Pain: 7 Post Procedural Pain: 4 Response to Treatment: Procedure was tolerated well Level of Consciousness (Post- Awake and Alert procedure): Post  Debridement Measurements of Total Wound Length: (cm) 4.3 Width: (cm) 7.7 Depth: (cm) 3 Volume: (cm) 78.014 Character of Wound/Ulcer Post Debridement: Requires Further Debridement Post Procedure Diagnosis Same as Pre-procedure Electronic Signature(s) Signed: 05/14/2020 5:11:14 PM By: Worthy Keeler PA-C Signed: 05/14/2020 5:28:41 PM By: [name] RN, BSN Entered By: [name] on 05/14/2020 13:36:13 -------------------------------------------------------------------------------- HPI Details Patient Name: Date of Service: Leonard Dunning A. 05/14/2020 12:30 PM Medical Record Number: [id number] Patient Account Number: [id number] Date of Birth/Sex: Treating RN: [birth year] (59 y.o. [name] Primary Care Provider: [name] Other Clinician: Referring Provider: Treating Provider/Extender: [name], Army Melia Weeks in Treatment: 1 History of Present Illness HPI Description: 05/07/2020 upon evaluation today patient appears to be doing somewhat poorly in regard to a surgery site on his left foot which is a transmetatarsal amputation site. He actually had his second and third toes removed in March 2021. Unfortunately that did not heal appropriately and so he subsequently in May he had a transmetatarsal amputation performed by Dr. [name]. Subsequently this unfortunately also dehisced and though he still has sutures in place this has been about 6 weeks out and unfortunately the sutures are not holding anything together there is necrotic tissue that the sutures are actually embedded into and overall this really needs to be cleaned out and to be honest I think the patient really needs a wound VAC. With that being said there does not fortunately appear to be any signs of infection which is good news. Nonetheless I do believe this is definitely going to be a significant time to healing even with the wound VAC in order to get this completely closed. There is a lot of depth to  the wound through the necrotic tissue and again a lot of the necrotic tissue needs to be removed we may be able to do some of that today I am not thinking we will get everything cleaned out however. No fevers, chills, nausea, vomiting, or diarrhea. The  patient does have a history of nicotine dependence and we did discuss that in detail today but other than that he really has no major medical problems at this point other than what is going on with his feet. In fact it really has not even been a determination as to why he is developing the wounds and having trouble healing his blood flow is good he does however smoke which could be affecting things in my opinion. 05/14/20 upon evaluation today patient appears to be doing better in my opinion. Overall the appearance of the wound is improved and I am very pleased with that. He has a tremendous amount of the necrotic tissue that between debridement last week, the use of Anasept in the interim, and debridement today has cleared away and again though he does have some depth this appears to be looking much more healthy. Overall I think a wound VAC would still do excellent for him even more so at this point. Electronic Signature(s) Signed: 05/14/2020 1:42:11 PM By: Worthy Keeler PA-C Entered By: Worthy Keeler on 05/14/2020 13:42:10 -------------------------------------------------------------------------------- Physical Exam Details Patient Name: Date of Service: Leonard Bentley 05/14/2020 12:30 PM Medical Record Number: [id number] Patient Account Number: [id number] Date of Birth/Sex: Treating RN: [birth year] (59 y.o. [name] Primary Care Provider: [name] Other Clinician: Referring Provider: Treating Provider/Extender: [name], [name] in Treatment: 1 Constitutional Well-nourished and well-hydrated in no acute distress. Respiratory normal breathing without difficulty. Psychiatric this patient is able to make  decisions and demonstrates good insight into disease process. Alert and Oriented x 3. pleasant and cooperative. Notes Upon inspection patient's wound bed actually showed signs of good granulation there was some necrotic tissue and I did have to perform sharp debridement to clear away some of the necrotic debris at this point. He tolerated the debridement without significant pain though he did have some minimal pain noted at this point post debridement the wound bed appears to be doing much better though not completely cleaned I think we are headed to that direction. Electronic Signature(s) Signed: 05/14/2020 1:42:37 PM By: Worthy Keeler PA-C Entered By: Worthy Keeler on 05/14/2020 13:42:36 -------------------------------------------------------------------------------- Physician Orders Details Patient Name: Date of Service: Leonard Dunning A. 05/14/2020 12:30 PM Medical Record Number: [id number] Patient Account Number: [id number] Date of Birth/Sex: Treating RN: 01-07-1961 (59 y.o. [name] Primary Care Provider: [name] Other Clinician: Referring Provider: Treating Provider/Extender: Lenox Ponds Weeks in Treatment: 1 Verbal / Phone Orders: No Diagnosis Coding ICD-10 Coding Code Description S91.302A Unspecified open wound, left foot, initial encounter T81.31XA Disruption of external operation (surgical) wound, not elsewhere classified, initial encounter F17.218 Nicotine dependence, cigarettes, with other nicotine-induced disorders Follow-up Appointments ppointment in 1 week. - Friday with Dr. Dellia Nims Return A Nurse Visit: - Tues to apply wound [facility] Dressing Change Frequency Wound #1 Left Amputation Site - Transmetatarsal Change dressing every Rauh. Wound Cleansing Wound #1 Left Amputation Site - Transmetatarsal Clean wound with Wound Cleanser Primary Wound Dressing Wound #1 Left Amputation Site - Transmetatarsal Other: - Vashe or anasept moistened  gauze packing Secondary Dressing Wound #1 Left Amputation Site - Transmetatarsal Kerlix/Rolled Gauze ABD pad Other: - ace wrap Negative Presssure Wound Therapy Wound Vac to wound continuously at 147mm/hg pressure - wound center to order Black Foam Edema Control Avoid standing for long periods of time Elevate legs to the level of the heart or above for 30 minutes daily and/or when sitting,  a frequency of: Off-Loading Other: - minimal weight bearing left foot Electronic Signature(s) Signed: 05/14/2020 5:11:14 PM By: Worthy Keeler PA-C Signed: 05/14/2020 5:28:41 PM By: [name] RN, BSN Entered By: [name] on 05/14/2020 13:46:40 -------------------------------------------------------------------------------- Problem List Details Patient Name: Date of Service: Leonard Dunning A. 05/14/2020 12:30 PM Medical Record Number: [id number] Patient Account Number: [id number] Date of Birth/Sex: Treating RN: 1961/10/10 (59 y.o. [name] Primary Care Provider: [name] Other Clinician: Referring Provider: Treating Provider/Extender: Lenox Ponds Weeks in Treatment: 1 Active Problems ICD-10 Encounter Code Description Active Date MDM Diagnosis S91.302A Unspecified open wound, left foot, initial encounter 05/07/2020 No Yes T81.31XA Disruption of external operation (surgical) wound, not elsewhere classified, 05/07/2020 No Yes initial encounter F17.218 Nicotine dependence, cigarettes, with other nicotine-induced disorders 05/07/2020 No Yes Inactive Problems Resolved Problems Electronic Signature(s) Signed: 05/14/2020 1:05:13 PM By: Worthy Keeler PA-C Entered By: Worthy Keeler on 05/14/2020 13:05:12 -------------------------------------------------------------------------------- Progress Note Details Patient Name: Date of Service: Leonard Dunning A. 05/14/2020 12:30 PM Medical Record Number: [id number] Patient Account Number: [id number] Date of Birth/Sex:  Treating RN: 1961-07-06 (59 y.o. [name] Primary Care Provider: [name] Other Clinician: Referring Provider: Treating Provider/Extender: Lenox Ponds Weeks in Treatment: 1 Subjective Chief Complaint Information obtained from Patient Left foot surgical ulcer History of Present Illness (HPI) 05/07/2020 upon evaluation today patient appears to be doing somewhat poorly in regard to a surgery site on his left foot which is a transmetatarsal amputation site. He actually had his second and third toes removed in March 2021. Unfortunately that did not heal appropriately and so he subsequently in May he had a transmetatarsal amputation performed by Dr. [name]. Subsequently this unfortunately also dehisced and though he still has sutures in place this has been about 6 weeks out and unfortunately the sutures are not holding anything together there is necrotic tissue that the sutures are actually embedded into and overall this really needs to be cleaned out and to be honest I think the patient really needs a wound VAC. With that being said there does not fortunately appear to be any signs of infection which is good news. Nonetheless I do believe this is definitely going to be a significant time to healing even with the wound VAC in order to get this completely closed. There is a lot of depth to the wound through the necrotic tissue and again a lot of the necrotic tissue needs to be removed we may be able to do some of that today I am not thinking we will get everything cleaned out however. No fevers, chills, nausea, vomiting, or diarrhea. The patient does have a history of nicotine dependence and we did discuss that in detail today but other than that he really has no major medical problems at this point other than what is going on with his feet. In fact it really has not even been a determination as to why he is developing the wounds and having trouble healing his blood flow  is good he does however smoke which could be affecting things in my opinion. 05/14/20 upon evaluation today patient appears to be doing better in my opinion. Overall the appearance of the wound is improved and I am very pleased with that. He has a tremendous amount of the necrotic tissue that between debridement last week, the use of Anasept in the interim, and debridement today has cleared away and again though he does have some  depth this appears to be looking much more healthy. Overall I think a wound VAC would still do excellent for him even more so at this point. Objective Constitutional Well-nourished and well-hydrated in no acute distress. Vitals Time Taken: 1:06 PM, Height: 69 in, Weight: 190 lbs, BMI: 28.1, Temperature: 97.7 F, Pulse: 103 bpm, Respiratory Rate: 18 breaths/min, Blood Pressure: 129/86 mmHg. Respiratory normal breathing without difficulty. Psychiatric this patient is able to make decisions and demonstrates good insight into disease process. Alert and Oriented x 3. pleasant and cooperative. General Notes: Upon inspection patient's wound bed actually showed signs of good granulation there was some necrotic tissue and I did have to perform sharp debridement to clear away some of the necrotic debris at this point. He tolerated the debridement without significant pain though he did have some minimal pain noted at this point post debridement the wound bed appears to be doing much better though not completely cleaned I think we are headed to that direction. Integumentary (Hair, Skin) Wound #1 status is Open. Original cause of wound was Surgical Injury. The wound is located on the Left Amputation Site - Transmetatarsal. The wound measures 4.3cm length x 7.7cm width x 3cm depth; 26.005cm^2 area and 78.014cm^3 volume. There is bone and Fat Layer (Subcutaneous Tissue) Exposed exposed. There is no tunneling or undermining noted. There is a medium amount of serosanguineous drainage  noted. The wound margin is distinct with the outline attached to the wound base. There is medium (34-66%) pink granulation within the wound bed. There is a medium (34-66%) amount of necrotic tissue within the wound bed including Eschar and Adherent Slough. Assessment Active Problems ICD-10 Unspecified open wound, left foot, initial encounter Disruption of external operation (surgical) wound, not elsewhere classified, initial encounter Nicotine dependence, cigarettes, with other nicotine-induced disorders Procedures Wound #1 Pre-procedure diagnosis of Wound #1 is a Dehisced Wound located on the Left Amputation Site - Transmetatarsal . There was a Excisional Skin/Subcutaneous Tissue Debridement with a total area of 15 sq cm performed by Worthy Keeler, PA. With the following instrument(s): Curette to remove Viable and Non-Viable tissue/material. Material removed includes Subcutaneous Tissue and Slough and after achieving pain control using Other (benzocaine 20% spray). No specimens were taken. A time out was conducted at 13:30, prior to the start of the procedure. A Minimum amount of bleeding was controlled with Pressure. The procedure was tolerated well with a pain level of 7 throughout and a pain level of 4 following the procedure. Post Debridement Measurements: 4.3cm length x 7.7cm width x 3cm depth; 78.014cm^3 volume. Character of Wound/Ulcer Post Debridement requires further debridement. Post procedure Diagnosis Wound #1: Same as Pre-Procedure Plan Follow-up Appointments: Return Appointment in 1 week. - Friday with Dr. Dellia Nims Nurse Visit: - Tues to apply wound VAC Dressing Change Frequency: Wound #1 Left Amputation Site - Transmetatarsal: Change dressing every Met[place]. Wound Cleansing: Wound #1 Left Amputation Site - Transmetatarsal: Clean wound with Wound Cleanser Primary Wound Dressing: Wound #1 Left Amputation Site - Transmetatarsal: Other: - Vashe or anasept moistened gauze  packing Secondary Dressing: Wound #1 Left Amputation Site - Transmetatarsal: Kerlix/Rolled Gauze ABD pad Other: - ace wrap Negative Presssure Wound Therapy: Wound Vac to wound continuously at 158mm/hg pressure - wound center to order Black Foam Edema Control: Avoid standing for long periods of time Elevate legs to the level of the heart or above for 30 minutes daily and/or when sitting, a frequency of: Off-Loading: Other: - minimal weight bearing left foot 1. I would recommend  currently that we continue with the Anasept moistened gauze packed into the wound bed he does seem to be doing well with this and overall I think that was a little bit she performed in the Vashe according to what the patient tells me. I think that is good to do just fine. 2. We can use some of the Santyl underneath this I do not see any problems with that. 3. With regard to infection I do not see anything that appears to be overtly infected at this point hopefully that will continue to be the case. 4. With regard to the wound VAC if we can get approval through the charity program with [employer] we will get that initiated for him we may need to switch him to a different [place] so that he is coming on same Monday and Thursday for [facility] changes or else potentially Tuesday and Friday just so it is more equal throughout the week. I think that may do better for him. I have no problem seeing him on Wednesday but I definitely what was best for the patient. We will see patient back for reevaluation in 2 weeks here in the clinic. If anything worsens or changes patient will contact our office for additional recommendations. Electronic Signature(s) Signed: 05/14/2020 5:11:14 PM By: Worthy Keeler PA-C Signed: 05/14/2020 5:28:41 PM By: [name] RN, BSN Previous Signature: 05/14/2020 1:43:59 PM Version By: Worthy Keeler PA-C Entered By: [name] on 05/14/2020  13:47:01 -------------------------------------------------------------------------------- SuperBill Details Patient Name: Date of Service: Leonard Dunning A. 05/14/2020 Medical Record Number: [id number] Patient Account Number: [id number] Date of Birth/Sex: Treating RN: October 25, 1961 (59 y.o. [name] Primary Care Provider: [name] Other Clinician: Referring Provider: Treating Provider/Extender: [name], Army Melia Weeks in Treatment: 1 Diagnosis Coding ICD-10 Codes Code Description [phone] Unspecified open wound, left foot, initial encounter T81.31XA Disruption of external operation (surgical) wound, not elsewhere classified, initial encounter F17.218 Nicotine dependence, cigarettes, with other nicotine-induced disorders Facility Procedures CPT4 Code: [id number] Description: 11042 - DEB SUBQ TISSUE 20 SQ CM/< ICD-10 Diagnosis Description T81.31XA Disruption of external operation (surgical) wound, not elsewhere classified, init Modifier: ial encounter Quantity: 1 Physician Procedures Electronic Signature(s) Signed: 05/14/2020 1:44:11 PM By: Worthy Keeler PA-C Entered By: Worthy Keeler on 05/14/2020 13:44:10

## 2020-05-14 NOTE — Telephone Encounter (Signed)
Rx sent 

## 2020-05-14 NOTE — Telephone Encounter (Signed)
Please advise  Copied from [place] [phone]. Topic: General - Other >> May 14, 2020  9:20 AM Leward Quan A wrote: Reason for CRM: Patient called to ask Lurena Joiner to please call in his Rx for warfarin (COUMADIN) 5 MG tablet to the [id number] - [place], [address]  Phone:  [phone] Fax:  [phone]

## 2020-05-15 ENCOUNTER — Other Ambulatory Visit: Payer: Self-pay

## 2020-05-15 ENCOUNTER — Ambulatory Visit: Payer: Self-pay | Attending: Nurse Practitioner | Admitting: Pharmacist

## 2020-05-15 DIAGNOSIS — Z9889 Other specified postprocedural states: Secondary | ICD-10-CM

## 2020-05-15 DIAGNOSIS — Z86711 Personal history of pulmonary embolism: Secondary | ICD-10-CM

## 2020-05-15 LAB — POCT INR: INR: 2.8 (ref 2.0–3.0)

## 2020-05-19 ENCOUNTER — Telehealth: Payer: Self-pay | Admitting: Podiatry

## 2020-05-19 NOTE — Telephone Encounter (Signed)
Pt requesting pain medication to be sent to pharmacy

## 2020-05-19 NOTE — Telephone Encounter (Addendum)
I spoke with pt and asked if he had any fever, increased swelling, drainage or discoloration and pt denied. Pt states he has throbbing and may have overdone being up on it.

## 2020-05-20 ENCOUNTER — Encounter (HOSPITAL_BASED_OUTPATIENT_CLINIC_OR_DEPARTMENT_OTHER): Payer: Medicaid Other | Admitting: Internal Medicine

## 2020-05-20 ENCOUNTER — Other Ambulatory Visit: Payer: Self-pay | Admitting: Podiatry

## 2020-05-20 DIAGNOSIS — T8131XA Disruption of external operation (surgical) wound, not elsewhere classified, initial encounter: Secondary | ICD-10-CM | POA: Diagnosis not present

## 2020-05-20 MED ORDER — OXYCODONE HCL 5 MG PO TABS: 5.0000 mg | ORAL_TABLET | Freq: Four times a day (QID) | ORAL | 0 refills | Status: AC | PRN

## 2020-05-20 NOTE — Telephone Encounter (Signed)
I sent it to the pharmacy for him. However since he is going to the wound care center and I am not actively treating the wound they would need to prescribe it.

## 2020-05-21 NOTE — Progress Notes (Signed)
Leonard Bentley ([id number]) Visit Report for 05/14/2020 Arrival Information Details Patient Name: Date of Service: Leonard Bentley 05/14/2020 12:30 PM Medical Record Number: [id number] Patient Account Number: [id number] Date of Birth/Sex: Treating RN: 04-Jul-1961 (59 y.o. Leonard Bentley Primary Care Annice Jolly: [name] Other Clinician: Referring Maanav Kassabian: Treating Zamiyah Resendes/Extender: Lenox Ponds Weeks in Treatment: 1 Visit Information History Since Last Visit Added or deleted any medications: No Patient Arrived: Leonard Bentley Any new allergies or adverse reactions: No Arrival Time: 13:05 Had a fall or experienced change in No Accompanied By: self activities of daily living that may affect Transfer Assistance: None risk of falls: Patient Identification Verified: Yes Signs or symptoms of abuse/neglect since last visito No Secondary Verification Process Completed: Yes Hospitalized since last visit: No Patient Requires Transmission-Based Precautions: No Implantable device outside of the clinic excluding No Patient Has Alerts: Yes cellular tissue based products placed in the center Patient Alerts: Patient on Blood Thinner since last visit: L ABI: 1.03 (01/2020) Has Dressing in Place as Prescribed: Yes Pain Present Now: No Electronic Signature(s) Signed: 05/16/2020 2:13:12 PM By: Sandre Kitty Entered By: Sandre Kitty on 05/14/2020 13:06:06 -------------------------------------------------------------------------------- Encounter Discharge Information Details Patient Name: Date of Service: Leonard Dunning A. 05/14/2020 12:30 PM Medical Record Number: [id number] Patient Account Number: [id number] Date of Birth/Sex: Treating RN: [birth year] (59 y.o. Leonard Bentley Primary Care Windell Musson: [name] Other Clinician: Referring Riley Papin: Treating Camrie Stock/Extender: Lenox Ponds Weeks in Treatment: 1 Encounter Discharge Information Items Post  Procedure Vitals Discharge Condition: Stable Temperature (F): 97.7 Ambulatory Status: Walker Pulse (bpm): 103 Discharge Destination: Home Respiratory Rate (breaths/min): 18 Transportation: Private Auto Blood Pressure (mmHg): 129/86 Accompanied By: self Schedule Follow-up Appointment: Yes Clinical Summary of Care: Patient Declined Electronic Signature(s) Signed: 05/14/2020 5:12:52 PM By: Kela Millin Entered By: Kela Millin on 05/14/2020 14:03:37 -------------------------------------------------------------------------------- Lower Extremity Assessment Details Patient Name: Date of Service: Leonard Dunning A. 05/14/2020 12:30 PM Medical Record Number: [id number] Patient Account Number: [id number] Date of Birth/Sex: Treating RN: 1960/11/29 (59 y.o. Leonard Bentley Primary Care Clio Gerhart: [name] Other Clinician: Referring Gerrett Loman: Treating Madalyn Legner/Extender: [name], [name] in Treatment: 1 Edema Assessment Assessed: [Left: No] [Right: No] Edema: [Left: Ye] [Right: s] Calf Left: Right: Point of Measurement: 31 cm From Medial Instep 31 cm cm Ankle Left: Right: Point of Measurement: 11 cm From Medial Instep 27 cm cm Vascular Assessment Pulses: Dorsalis Pedis Palpable: [Left:No] Electronic Signature(s) Signed: 05/14/2020 5:12:52 PM By: Kela Millin Entered By: Kela Millin on 05/14/2020 13:12:41 -------------------------------------------------------------------------------- [place] Details Patient Name: Date of Service: Leonard Dunning A. 05/14/2020 12:30 PM Medical Record Number: [id number] Patient Account Number: [id number] Date of Birth/Sex: Treating RN: 08/16/1961 (59 y.o. Leonard Bentley Primary Care Aditya Nastasi: [name] Other Clinician: Referring Wilder Amodei: Treating Kelee Cunningham/Extender: [name], Army Melia Weeks in Treatment: 1 Active Inactive Abuse / Safety / Falls / Self Care  Management Nursing Diagnoses: Potential for falls Goals: Patient/caregiver will verbalize/demonstrate measures taken to prevent injury and/or falls Date Initiated: 05/07/2020 Target Resolution Date: 06/04/2020 Goal Status: Active Interventions: Assess fall risk on admission and as needed Assess impairment of mobility on admission and as needed per policy Notes: Necrotic Tissue Nursing Diagnoses: Impaired tissue integrity related to necrotic/devitalized tissue Knowledge deficit related to management of necrotic/devitalized tissue Goals: Necrotic/devitalized tissue will be minimized in the wound bed Date Initiated: 05/07/2020 Target Resolution Date: 06/04/2020 Goal Status: Active Patient/caregiver will verbalize understanding of  reason and process for debridement of necrotic tissue Date Initiated: 05/07/2020 Target Resolution Date: 06/04/2020 Goal Status: Active Interventions: Assess patient pain level pre-, during and post procedure and prior to discharge Provide education on necrotic tissue and debridement process Treatment Activities: Apply topical anesthetic as ordered : 05/07/2020 Biologic debridement : 05/07/2020 Excisional debridement : 05/07/2020 Notes: Wound/Skin Impairment Nursing Diagnoses: Impaired tissue integrity Knowledge deficit related to smoking impact on wound healing Knowledge deficit related to ulceration/compromised skin integrity Goals: Patient will demonstrate a reduced rate of smoking or cessation of smoking Date Initiated: 05/07/2020 Target Resolution Date: 06/04/2020 Goal Status: Active Patient/caregiver will verbalize understanding of skin care regimen Date Initiated: 05/07/2020 Target Resolution Date: 06/04/2020 Goal Status: Active Ulcer/skin breakdown will have a volume reduction of 30% by week 4 Date Initiated: 05/07/2020 Target Resolution Date: 06/04/2020 Goal Status: Active Interventions: Assess patient/caregiver ability to obtain necessary  supplies Assess patient/caregiver ability to perform ulcer/skin care regimen upon admission and as needed Assess ulceration(s) every visit Provide education on ulcer and skin care Treatment Activities: Skin care regimen initiated : 05/07/2020 Topical wound management initiated : 05/07/2020 Notes: Electronic Signature(s) Signed: 05/14/2020 5:28:41 PM By: [name] RN, BSN Entered By: [name] on 05/14/2020 13:32:28 -------------------------------------------------------------------------------- Pain Assessment Details Patient Name: Date of Service: Leonard Dunning A. 05/14/2020 12:30 PM Medical Record Number: [id number] Patient Account Number: [id number] Date of Birth/Sex: Treating RN: 20-May-1961 (59 y.o. Leonard Bentley Primary Care Jasira Robinson: [name] Other Clinician: Referring Shuntavia Yerby: Treating Quintell Bonnin/Extender: [name], Army Melia Weeks in Treatment: 1 Active Problems Location of Pain Severity and Description of Pain Patient Has Paino No Site Locations Pain Management and Medication Current Pain Management: Electronic Signature(s) Signed: 05/14/2020 5:28:41 PM By: [name] RN, BSN Signed: 05/16/2020 2:13:12 PM By: Sandre Kitty Entered By: Sandre Kitty on 05/14/2020 13:06:33 -------------------------------------------------------------------------------- Patient/Caregiver Education Details Patient Name: Date of Service: Leonard Bentley 7/7/2021andnbsp12:30 PM Medical Record Number: [id number] Patient Account Number: [id number] Date of Birth/Gender: Treating RN: 28-Aug-1961 (59 y.o. Leonard Bentley Primary Care Physician: [name] Other Clinician: Referring Physician: Treating Physician/Extender: Noni Saupe in Treatment: 1 Education Assessment Education Provided To: Patient Education Topics Provided Offloading: Methods: Explain/Verbal Responses: Reinforcements needed, State content  correctly Wound Debridement: Methods: Explain/Verbal Responses: Reinforcements needed, State content correctly Wound/Skin Impairment: Methods: Explain/Verbal Responses: Reinforcements needed, State content correctly Electronic Signature(s) Signed: 05/14/2020 5:28:41 PM By: [name] RN, BSN Entered By: [name] on 05/14/2020 13:32:57 -------------------------------------------------------------------------------- Wound Assessment Details Patient Name: Date of Service: Leonard Dunning A. 05/14/2020 12:30 PM Medical Record Number: [id number] Patient Account Number: [id number] Date of Birth/Sex: Treating RN: 1961-06-09 (58 y.o. Leonard Bentley Primary Care Alan Drummer: [name] Other Clinician: Referring Kashmere Daywalt: Treating Celicia Minahan/Extender: [name], [name] in Treatment: 1 Wound Status Wound Number: 1 Primary Dehisced Wound Etiology: Wound Location: Left Amputation Site - Transmetatarsal Wound Open Wounding Event: Surgical Injury Status: Date Acquired: 03/21/2020 Comorbid Anemia, Arrhythmia, Congestive Heart Failure, Hypertension, Weeks Of Treatment: 1 History: Osteoarthritis, Osteomyelitis, Neuropathy, Received Clustered Wound: No Chemotherapy Photos Photo Uploaded By: Mikeal Hawthorne on 05/15/2020 13:40:44 Wound Measurements Length: (cm) 4.3 Width: (cm) 7.7 Depth: (cm) 3 Area: (cm) 26.005 Volume: (cm) 78.014 % Reduction in Area: 27.2% % Reduction in Volume: 37.6% Epithelialization: Small (1-33%) Tunneling: No Undermining: No Wound Description Classification: Full Thickness With Exposed Support Struct Wound Margin: Distinct, outline attached Exudate Amount: Medium Exudate Type: Serosanguineous Exudate Color: red, brown ures Foul Odor After Cleansing:  No Slough/Fibrino Yes Wound Bed Granulation Amount: Medium (34-66%) Exposed Structure Granulation Quality: Pink Fascia Exposed: No Necrotic Amount: Medium (34-66%) Fat Layer  (Subcutaneous Tissue) Exposed: Yes Necrotic Quality: Eschar, Adherent Slough Tendon Exposed: No Muscle Exposed: No Joint Exposed: No Bone Exposed: Yes Treatment Notes Wound #1 (Left Amputation Site - Transmetatarsal) 1. Cleanse With Wound Cleanser 2. Periwound Care Skin Prep 3. Primary Dressing Applied Other primary dressing (specifiy in notes) 4. Secondary Dressing ABD Pad Dry Gauze Roll Gauze 5. Secured With Tape 6. Support Layer Psychologist, forensic Notes anacept moist gauze packing , ace wrap Electronic Signature(s) Signed: 05/14/2020 5:12:52 PM By: Kela Millin Entered By: Kela Millin on 05/14/2020 13:13:57 -------------------------------------------------------------------------------- Vitals Details Patient Name: Date of Service: Leonard Dunning A. 05/14/2020 12:30 PM Medical Record Number: [id number] Patient Account Number: [id number] Date of Birth/Sex: Treating RN: 1961-06-17 (59 y.o. Leonard Bentley Primary Care Valeen Borys: [name] Other Clinician: Referring Dearion Huot: Treating Ansley Stanwood/Extender: [name], [name] in Treatment: 1 Vital Signs Time Taken: 13:06 Temperature (F): 97.7 Height (in): 69 Pulse (bpm): 103 Weight (lbs): 190 Respiratory Rate (breaths/min): 18 Body Mass Index (BMI): 28.1 Blood Pressure (mmHg): 129/86 Reference Range: 80 - 120 mg / dl Electronic Signature(s) Signed: 05/16/2020 2:13:12 PM By: Sandre Kitty Entered By: Sandre Kitty on 05/14/2020 13:06:22

## 2020-05-23 ENCOUNTER — Encounter (HOSPITAL_BASED_OUTPATIENT_CLINIC_OR_DEPARTMENT_OTHER): Payer: Medicaid Other | Admitting: Internal Medicine

## 2020-05-23 DIAGNOSIS — T8131XA Disruption of external operation (surgical) wound, not elsewhere classified, initial encounter: Secondary | ICD-10-CM | POA: Diagnosis not present

## 2020-05-26 NOTE — Progress Notes (Signed)
Duhamel, Leonard A. ([id number]) Visit Report for 05/23/2020 HPI Details Patient Name: Date of Service: Leonard Bentley, Leonard E A. 05/23/2020 2:30 PM Medical Record Number: [id number] Patient Account Number: [id number] Date of Birth/Sex: Treating RN: 10/05/1961 (59 Bentley.o. M) Dwiggins, Shannon Primary Care Provider: Fleming, Zelda Other Clinician: Referring Provider: Treating Provider/Extender: ,  Fleming, [name] in Treatment: 2 History of Present Illness HPI Description: 05/07/2020 upon evaluation today patient appears to be doing somewhat poorly in regard to a surgery site on his left foot which is a transmetatarsal amputation site. He actually had his second and third toes removed in March 2021. Unfortunately that did not heal appropriately and so he subsequently in May he had a transmetatarsal amputation performed by Dr. Duda. Subsequently this unfortunately also dehisced and though he still has sutures in place this has been about 6 weeks out and unfortunately the sutures are not holding anything together there is necrotic tissue that the sutures are actually embedded into and overall this really needs to be cleaned out and to be honest I think the patient really needs a wound VAC. With that being said there does not fortunately appear to be any signs of infection which is good news. Nonetheless I do believe this is definitely going to be a significant time to healing even with the wound VAC in order to get this completely closed. There is a lot of depth to the wound through the necrotic tissue and again a lot of the necrotic tissue needs to be removed we may be able to do some of that today I am not thinking we will get everything cleaned out however. No fevers, chills, nausea, vomiting, or diarrhea. The patient does have a history of nicotine dependence and we did discuss that in detail today but other than that he really has no major medical problems at this point other than what is going on  with his feet. In fact it really has not even been a determination as to why he is developing the wounds and having trouble healing his blood flow is good he does however smoke which could be affecting things in my opinion. 05/14/20 upon evaluation today patient appears to be doing better in my opinion. Overall the appearance of the wound is improved and I am very pleased with that. He has a tremendous amount of the necrotic tissue that between debridement last week, the use of Anasept in the interim, and debridement today has cleared away and again though he does have some depth this appears to be looking much more healthy. Overall I think a wound VAC would still do excellent for him even more so at this point. 7/16; patient I am seeing for the first time today. Considerable improvement in the surgical wound of the left TMA site. 4 days on a wound VAC. The majority of this is healthy granulated tissue. Some surface slough. Looking back through the pictures there is really been a tremendous improvement since his arrival in the clinic. He does not have a macrovascular issue. He does have a history of recurrent PEs on Coumadin Electronic Signature(s) Signed: 05/26/2020 8:03:52 AM By: ,  MD Entered By: ,  on 05/23/2020 14:56:10 -------------------------------------------------------------------------------- Physical Exam Details Patient Name: Date of Service: Leonard Bentley, Leonard E A. 05/23/2020 2:30 PM Medical Record Number: [id number] Patient Account Number: [id number] Date of Birth/Sex: Treating RN: 07/08/1961 (59 Bentley.o. M) Dwiggins, Shannon Primary Care Provider: Fleming, Zelda Other Clinician: Referring Provider: Treating Provider/Extender: ,  Fleming, [name] in   Treatment: 2 Constitutional Sitting or standing Blood Pressure is within target range for patient.. Pulse regular and within target range for patient.. Respirations regular, non-labored and within target  range.. Temperature is normal and within the target range for the patient.. Appears in no distress. Respiratory work of breathing is normal. Cardiovascular Pedal pulses are robust. Notes ExcellentWound exam; granulation some surface slough. No sharp debridement today. He has some separation in the crease of the granulation and one small open area that has depth. No evidence of surrounding infection Electronic Signature(s) Signed: 05/26/2020 8:03:52 AM By: ,  MD Entered By: ,  on 05/23/2020 14:59:20 -------------------------------------------------------------------------------- Physician Orders Details Patient Name: Date of Service: Leonard Bentley, Leonard E A. 05/23/2020 2:30 PM Medical Record Number: [id number] Patient Account Number: [id number] Date of Birth/Sex: Treating RN: 10/26/1961 (59 Bentley.o. M) Dwiggins, Shannon Primary Care Provider: Fleming, Zelda Other Clinician: Referring Provider: Treating Provider/Extender: ,  Fleming, [name] in Treatment: 2 Verbal / Phone Orders: No Diagnosis Coding Follow-up Appointments ppointment in 1 week. - Friday with Dr.  Return A Nurse Visit: - Wednesday for vac change Dressing Change Frequency Wound #1 Left Amputation Site - Transmetatarsal Other: - twice a week Wound Cleansing Wound #1 Left Amputation Site - Transmetatarsal Clean wound with Wound Cleanser Primary Wound Dressing Wound #1 Left Amputation Site - Transmetatarsal Silver Collagen - to base Other: - wound vac Secondary Dressing Wound #1 Left Amputation Site - Transmetatarsal ABD pad Other: - ace wrap Negative Presssure Wound Therapy Wound Vac to wound continuously at 125mm/hg pressure Black Foam Edema Control Avoid standing for long periods of time Elevate legs to the level of the heart or above for 30 minutes daily and/or when sitting, a frequency of: Off-Loading Other: - minimal weight bearing left foot Electronic  Signature(s) Signed: 05/23/2020 4:43:21 PM By: Dwiggins, Shannon Signed: 05/26/2020 8:03:52 AM By: ,  MD Entered By: Dwiggins, Shannon on 05/23/2020 14:46:57 -------------------------------------------------------------------------------- Problem List Details Patient Name: Date of Service: Leonard Bentley, Leonard E A. 05/23/2020 2:30 PM Medical Record Number: [id number] Patient Account Number: [id number] Date of Birth/Sex: Treating RN: 05/06/1961 (59 Bentley.o. M) Dwiggins, Shannon Primary Care Provider: Fleming, Zelda Other Clinician: Referring Provider: Treating Provider/Extender: ,  Fleming, [name] in Treatment: 2 Active Problems ICD-10 Encounter Code Description Active Date MDM Diagnosis S91.302A Unspecified open wound, left foot, initial encounter 05/07/2020 No Yes T81.31XA Disruption of external operation (surgical) wound, not elsewhere classified, 05/07/2020 No Yes initial encounter F17.218 Nicotine dependence, cigarettes, with other nicotine-induced disorders 05/07/2020 No Yes Inactive Problems Resolved Problems Electronic Signature(s) Signed: 05/26/2020 8:03:52 AM By: ,  MD Entered By: ,  on 05/23/2020 14:54:46 -------------------------------------------------------------------------------- Progress Note Details Patient Name: Date of Service: Leonard Bentley, Leonard E A. 05/23/2020 2:30 PM Medical Record Number: [id number] Patient Account Number: [id number] Date of Birth/Sex: Treating RN: 04/16/1961 (59 Bentley.o. M) Dwiggins, Shannon Primary Care Provider: Fleming, Zelda Other Clinician: Referring Provider: Treating Provider/Extender: ,  Fleming, [name] in Treatment: 2 Subjective History of Present Illness (HPI) 05/07/2020 upon evaluation today patient appears to be doing somewhat poorly in regard to a surgery site on his left foot which is a transmetatarsal amputation site. He actually had his second and third toes removed in March  2021. Unfortunately that did not heal appropriately and so he subsequently in May he had a transmetatarsal amputation performed by Dr. Duda. Subsequently this unfortunately also dehisced and though he still has sutures in place this has been about 6 weeks out and unfortunately the sutures are not holding   anything together there is necrotic tissue that the sutures are actually embedded into and overall this really needs to be cleaned out and to be honest I think the patient really needs a wound VAC. With that being said there does not fortunately appear to be any signs of infection which is good news. Nonetheless I do believe this is definitely going to be a significant time to healing even with the wound VAC in order to get this completely closed. There is a lot of depth to the wound through the necrotic tissue and again a lot of the necrotic tissue needs to be removed we may be able to do some of that today I am not thinking we will get everything cleaned out however. No fevers, chills, nausea, vomiting, or diarrhea. The patient does have a history of nicotine dependence and we did discuss that in detail today but other than that he really has no major medical problems at this point other than what is going on with his feet. In fact it really has not even been a determination as to why he is developing the wounds and having trouble healing his blood flow is good he does however smoke which could be affecting things in my opinion. 05/14/20 upon evaluation today patient appears to be doing better in my opinion. Overall the appearance of the wound is improved and I am very pleased with that. He has a tremendous amount of the necrotic tissue that between debridement last week, the use of Anasept in the interim, and debridement today has cleared away and again though he does have some depth this appears to be looking much more healthy. Overall I think a wound VAC would still do excellent for him even more so  at this point. 7/16; patient I am seeing for the first time today. Considerable improvement in the surgical wound of the left TMA site. 4 days on a wound VAC. The majority of this is healthy granulated tissue. Some surface slough. Looking back through the pictures there is really been a tremendous improvement since his arrival in the clinic. He does not have a macrovascular issue. He does have a history of recurrent PEs on Coumadin Objective Constitutional Sitting or standing Blood Pressure is within target range for patient.. Pulse regular and within target range for patient.Marland Kitchen Respirations regular, non-labored and within target range.. Temperature is normal and within the target range for the patient.Marland Kitchen Appears in no distress. Vitals Time Taken: 2:07 PM, Height: 69 in, Weight: 190 lbs, BMI: 28.1, Temperature: 97.9 F, Pulse: 91 bpm, Respiratory Rate: 18 breaths/min, Blood Pressure: 115/81 mmHg. Respiratory work of breathing is normal. Cardiovascular Pedal pulses are robust. General Notes: ExcellentWound exam; granulation some surface slough. No sharp debridement today. He has some separation in the crease of the granulation and one small open area that has depth. No evidence of surrounding infection Integumentary (Hair, Skin) Wound #1 status is Open. Original cause of wound was Surgical Injury. The wound is located on the Left Amputation Site - Transmetatarsal. The wound measures 3.1cm length x 7.2cm width x 1.5cm depth; 17.53cm^2 area and 26.295cm^3 volume. There is bone and Fat Layer (Subcutaneous Tissue) Exposed exposed. There is no tunneling or undermining noted. There is a medium amount of serosanguineous drainage noted. The wound margin is distinct with the outline attached to the wound base. There is medium (34-66%) pink granulation within the wound bed. There is a medium (34-66%) amount of necrotic tissue within the wound bed including Eschar and Adherent  Slough. Assessment Active  Problems ICD-10 Unspecified open wound, left foot, initial encounter Disruption of external operation (surgical) wound, not elsewhere classified, initial encounter Nicotine dependence, cigarettes, with other nicotine-induced disorders Plan Follow-up Appointments: Return Appointment in 1 week. - Friday with Dr.  Nurse Visit: - Wednesday for vac change Dressing Change Frequency: Wound #1 Left Amputation Site - Transmetatarsal: Other: - twice a week Wound Cleansing: Wound #1 Left Amputation Site - Transmetatarsal: Clean wound with Wound Cleanser Primary Wound Dressing: Wound #1 Left Amputation Site - Transmetatarsal: Silver Collagen - to base Other: - wound vac Secondary Dressing: Wound #1 Left Amputation Site - Transmetatarsal: ABD pad Other: - ace wrap Negative Presssure Wound Therapy: Wound Vac to wound continuously at 125mm/hg pressure Black Foam Edema Control: Avoid standing for long periods of time Elevate legs to the level of the heart or above for 30 minutes daily and/or when sitting, a frequency of: Off-Loading: Other: - minimal weight bearing left foot 1. The patient is doing very nicely 2. Put silver collagen under the VAC foam to the creased area as well as a small probing area medially 3. No evidence of infection 4. T to him about smoking issues alk Electronic Signature(s) Signed: 05/26/2020 8:03:52 AM By: ,  MD Entered By: ,  on 05/23/2020 15:00:08 -------------------------------------------------------------------------------- SuperBill Details Patient Name: Date of Service: Leonard Bentley, Leonard E A. 05/23/2020 Medical Record Number: [id number] Patient Account Number: [id number] Date of Birth/Sex: Treating RN: 06/02/1961 (59 Bentley.o. M) Dwiggins, Shannon Primary Care Provider: Fleming, Zelda Other Clinician: Referring Provider: Treating Provider/Extender: ,  Fleming, [name] in Treatment: 2 Diagnosis Coding ICD-10  Codes Code Description S91.302A Unspecified open wound, left foot, initial encounter T81.31XA Disruption of external operation (surgical) wound, not elsewhere classified, initial encounter F17.218 Nicotine dependence, cigarettes, with other nicotine-induced disorders Facility Procedures CPT4 Code: 76100129 Description: 97605 - WOUND VAC-50 SQ CM OR LESS Modifier: Quantity: 1 Physician Procedures : CPT4 Code Description Modifier [id number] - WC PHYS LEVEL 3 - EST PT ICD-10 Diagnosis Description S91.302A Unspecified open wound, left foot, initial encounter T81.31XA Disruption of external operation (surgical) wound, not elsewhere classified,  initial encounter Quantity: 1 Electronic Signature(s) Signed: 05/26/2020 8:03:52 AM By: ,  MD Entered By: ,  on 05/23/2020 15:00:28 

## 2020-05-26 NOTE — Progress Notes (Signed)
CHER, EGNOR ([id number]) Visit Report for 05/20/2020 Arrival Information Details Patient Name: Date of Service: Leonard Bentley 05/20/2020 12:30 PM Medical Record Number: [id number] Patient Account Number: [id number] Date of Birth/Sex: Treating RN: [birth year] (59 y.o. Janyth Contes Primary Care Malania Gawthrop: [name] Other Clinician: Referring Auron Tadros: Treating Stefanee Mckell/Extender: [name] in Treatment: 1 Visit Information History Since Last Visit Added or deleted any medications: No Patient Arrived: Ambulatory Any new allergies or adverse reactions: No Arrival Time: 12:37 Had a fall or experienced change in No Accompanied By: alone activities of daily living that may affect Transfer Assistance: None risk of falls: Patient Identification Verified: Yes Signs or symptoms of abuse/neglect since last visito No Secondary Verification Process Completed: Yes Hospitalized since last visit: No Patient Requires Transmission-Based Precautions: No Implantable device outside of the clinic excluding No Patient Has Alerts: Yes cellular tissue based products placed in the center Patient Alerts: Patient on Blood Thinner since last visit: L ABI: 1.03 (01/2020) Pain Present Now: No Electronic Signature(s) Signed: 05/26/2020 4:12:59 PM By: Levan Hurst RN, BSN Entered By: Levan Hurst on 05/20/2020 12:38:03 -------------------------------------------------------------------------------- Encounter Discharge Information Details Patient Name: Date of Service: Leonard Dunning A. 05/20/2020 12:30 PM Medical Record Number: [id number] Patient Account Number: [id number] Date of Birth/Sex: Treating RN: [birth year] (59 y.o. Janyth Contes Primary Care Brayon Bielefeld: [name] Other Clinician: Referring Giah Fickett: Treating Wanetta Funderburke/Extender: [name] in Treatment: 1 Encounter Discharge Information Items Discharge Condition: Stable Ambulatory  Status: Walker Discharge Destination: Home Transportation: Private Auto Accompanied By: alone Schedule Follow-up Appointment: Yes Clinical Summary of Care: Patient Declined Electronic Signature(s) Signed: 05/26/2020 4:12:59 PM By: Levan Hurst RN, BSN Entered By: Levan Hurst on 05/20/2020 17:02:38 -------------------------------------------------------------------------------- Negative Pressure Wound Therapy Application (NPWT) Details Patient Name: Date of Service: Leonard Bentley 05/20/2020 12:30 PM Medical Record Number: [id number] Patient Account Number: [id number] Date of Birth/Sex: Treating RN: 23-Jul-1961 (59 y.o. Janyth Contes Primary Care Teddy Rebstock: [name] Other Clinician: Referring Imagine Nest: Treating Pami Wool/Extender: Tomasa Blase, Paulette Blanch in Treatment: 1 NPWT Application Performed for: Wound #1 Left Amputation Site - Transmetatarsal Performed By: Levan Hurst, RN Type: VAC System Coverage Size (sq cm): 33.11 Pressure Type: Constant Pressure Setting: 125 mmHG Drain Type: None Quantity of Sponges/Gauze Inserted: 1 piece black foam inserted Sponge/Dressing Type: Foam, Black Date Initiated: 05/20/2020 Response to Treatment: pt tolerated well Electronic Signature(s) Signed: 05/26/2020 4:12:59 PM By: Levan Hurst RN, BSN Entered By: Levan Hurst on 05/20/2020 17:00:05 -------------------------------------------------------------------------------- Patient/Caregiver Education Details Patient Name: Date of Service: Leonard Bentley 7/13/2021andnbsp12:30 PM Medical Record Number: [id number] Patient Account Number: [id number] Date of Birth/Gender: Treating RN: 04-04-1961 (59 y.o. Janyth Contes Primary Care Physician: [name] Other Clinician: Referring Physician: Treating Physician/Extender: [name] in Treatment: 1 Education Assessment Education Provided To: Patient Education Topics  Provided Wound/Skin Impairment: Methods: Explain/Verbal Responses: State content correctly Electronic Signature(s) Signed: 05/26/2020 4:12:59 PM By: Levan Hurst RN, BSN Entered By: Levan Hurst on 05/20/2020 17:02:22 -------------------------------------------------------------------------------- Wound Assessment Details Patient Name: Date of Service: Leonard Dunning A. 05/20/2020 12:30 PM Medical Record Number: [id number] Patient Account Number: [id number] Date of Birth/Sex: Treating RN: [birth year] (59 y.o. Janyth Contes Primary Care Aariona Momon: [name] Other Clinician: Referring Kraven Calk: Treating Euna Armon/Extender: Tomasa Blase, Paulette Blanch in Treatment: 1 Wound Status Wound Number: 1 Primary Dehisced Wound Etiology: Wound Location: Left Amputation Site - Transmetatarsal Wound Open Wounding Event: Surgical Injury Status: Date  Acquired: 03/21/2020 Comorbid Anemia, Arrhythmia, Congestive Heart Failure, Hypertension, Weeks Of Treatment: 1 History: Osteoarthritis, Osteomyelitis, Neuropathy, Received Clustered Wound: No Chemotherapy Wound Measurements Length: (cm) 4.3 Width: (cm) 7.7 Depth: (cm) 3 Area: (cm) 26.005 Volume: (cm) 78.014 % Reduction in Area: 27.2% % Reduction in Volume: 37.6% Epithelialization: Small (1-33%) Tunneling: No Undermining: No Wound Description Classification: Full Thickness With Exposed Support Structures Wound Margin: Distinct, outline attached Exudate Amount: Medium Exudate Type: Serosanguineous Exudate Color: red, brown Foul Odor After Cleansing: No Slough/Fibrino Yes Wound Bed Granulation Amount: Medium (34-66%) Exposed Structure Granulation Quality: Pink Fascia Exposed: No Necrotic Amount: Medium (34-66%) Fat Layer (Subcutaneous Tissue) Exposed: Yes Necrotic Quality: Eschar, Adherent Slough Tendon Exposed: No Muscle Exposed: No Joint Exposed: No Bone Exposed: Yes Electronic Signature(s) Signed: 05/26/2020  4:12:59 PM By: Levan Hurst RN, BSN Entered By: Levan Hurst on 05/20/2020 16:58:51 -------------------------------------------------------------------------------- Vitals Details Patient Name: Date of Service: Leonard Dunning A. 05/20/2020 12:30 PM Medical Record Number: [id number] Patient Account Number: [id number] Date of Birth/Sex: Treating RN: [birth year] (59 y.o. Janyth Contes Primary Care Betul Brisky: [name] Other Clinician: Referring Astin Rape: Treating Kevina Piloto/Extender: Tomasa Blase, Paulette Blanch in Treatment: 1 Vital Signs Time Taken: 12:38 Temperature (F): 97.9 Height (in): 69 Pulse (bpm): 97 Weight (lbs): 190 Respiratory Rate (breaths/min): 18 Body Mass Index (BMI): 28.1 Blood Pressure (mmHg): 116/84 Reference Range: 80 - 120 mg / dl Electronic Signature(s) Signed: 05/26/2020 4:12:59 PM By: Levan Hurst RN, BSN Entered By: Levan Hurst on 05/20/2020 12:38:19

## 2020-05-26 NOTE — Progress Notes (Signed)
AUGUSTUS, ZURAWSKI ([id number]) Visit Report for 05/23/2020 Arrival Information Details Patient Name: Date of Service: [name] 05/23/2020 2:30 PM Medical Record Number: [id number] Patient Account Number: [id number] Date of Birth/Sex: Treating RN: [birth year] (59 y.o. Jerilynn Mages) Carlene Coria Primary Care Alissa Pharr: [name] Other Clinician: Referring Dennie Moltz: Treating Takyah Ciaramitaro/Extender: [name] in Treatment: 2 Visit Information History Since Last Visit All ordered tests and consults were completed: No Patient Arrived: [name] Added or deleted any medications: No Arrival Time: 14:00 Any new allergies or adverse reactions: No Accompanied By: self Had a fall or experienced change in No Transfer Assistance: None activities of daily living that may affect Patient Identification Verified: Yes risk of falls: Secondary Verification Process Completed: Yes Signs or symptoms of abuse/neglect since last visito No Patient Requires Transmission-Based Precautions: No Hospitalized since last visit: No Patient Has Alerts: Yes Implantable device outside of the clinic excluding No Patient Alerts: Patient on Blood Thinner cellular tissue based products placed in the center L ABI: 1.03 (01/2020) since last visit: Has Dressing in Place as Prescribed: Yes Pain Present Now: No Electronic Signature(s) Signed: 05/23/2020 3:56:02 PM By: Carlene Coria RN Entered By: Carlene Coria on 05/23/2020 14:07:37 -------------------------------------------------------------------------------- Encounter Discharge Information Details Patient Name: Date of Service: [name] 05/23/2020 2:30 PM Medical Record Number: [id number] Patient Account Number: [id number] Date of Birth/Sex: Treating RN: 1961/06/29 (59 y.o. [name] Primary Care Thula Stewart: [name] Other Clinician: Referring Ernan Runkles: Treating Lindyn Vossler/Extender: [name] in Treatment:  2 Encounter Discharge Information Items Discharge Condition: Stable Ambulatory Status: Walker Discharge Destination: Home Transportation: Other Accompanied By: self Schedule Follow-up Appointment: Yes Clinical Summary of Care: Patient Declined Notes SCAT Electronic Signature(s) Signed: 05/23/2020 4:41:06 PM By: [name] RN, BSN Entered By: [name] on 05/23/2020 15:12:36 -------------------------------------------------------------------------------- Lower Extremity Assessment Details Patient Name: Date of Service: [name] 05/23/2020 2:30 PM Medical Record Number: [id number] Patient Account Number: [id number] Date of Birth/Sex: Treating RN: 03/16/1961 (59 y.o. Oval Linsey Primary Care Jamileth Putzier: [name] Other Clinician: Referring Sharnette Kitamura: Treating Braedyn Riggle/Extender: Tomasa Blase, Army Melia Weeks in Treatment: 2 Edema Assessment Assessed: [Left: No] [Right: No] Edema: [Left: Ye] [Right: s] Calf Left: Right: Point of Measurement: 31 cm From Medial Instep 31 cm cm Ankle Left: Right: Point of Measurement: 11 cm From Medial Instep 27 cm cm Electronic Signature(s) Signed: 05/23/2020 3:56:02 PM By: Carlene Coria RN Entered By: Carlene Coria on 05/23/2020 14:16:52 -------------------------------------------------------------------------------- Multi Wound Chart Details Patient Name: Date of Service: [name] 05/23/2020 2:30 PM Medical Record Number: [id number] Patient Account Number: [id number] Date of Birth/Sex: Treating RN: 06-28-1961 (59 y.o. [name] Primary Care Shanyiah Conde: [name] Other Clinician: Referring Supreme Rybarczyk: Treating Amalia Edgecombe/Extender: Tomasa Blase, Paulette Blanch in Treatment: 2 Vital Signs Height(in): 69 Pulse(bpm): 91 Weight(lbs): 190 Blood Pressure(mmHg): 115/81 Body Mass Index(BMI): 28 Temperature(F): 97.9 Respiratory Rate(breaths/min): 18 Photos: [1:No Photos Left Amputation  Site - Transmetatarsal N/A] [N/A:N/A] Wound Location: [1:Surgical Injury] [N/A:N/A] Wounding Event: [1:Dehisced Wound] [N/A:N/A] Primary Etiology: [1:Anemia, Arrhythmia, Congestive Heart N/A] Comorbid History: [1:Failure, Hypertension, Osteoarthritis, Osteomyelitis, Neuropathy, Received Chemotherapy 03/21/2020] [N/A:N/A] Date Acquired: [1:2] [N/A:N/A] Weeks of Treatment: [1:Open] [N/A:N/A] Wound Status: [1:3.1x7.2x1.5] [N/A:N/A] Measurements L x W x D (cm) [1:17.53] [N/A:N/A] A (cm) : rea [1:26.295] [N/A:N/A] Volume (cm) : [1:50.90%] [N/A:N/A] % Reduction in Area: [1:79.00%] [N/A:N/A] % Reduction in Volume: [1:Full Thickness With Exposed Support] [N/A:N/A] Classification: [1:Structures Medium] [N/A:N/A] Exudate A mount: [1:Serosanguineous] [N/A:N/A] Exudate  Type: [1:red, brown] [N/A:N/A] Exudate Color: [1:Distinct, outline attached] [N/A:N/A] Wound Margin: [1:Medium (34-66%)] [N/A:N/A] Granulation Amount: [1:Pink] [N/A:N/A] Granulation Quality: [1:Medium (34-66%)] [N/A:N/A] Necrotic Amount: [1:Eschar, Adherent Slough] [N/A:N/A] Necrotic Tissue: [1:Fat Layer (Subcutaneous Tissue)] [N/A:N/A] Exposed Structures: [1:Exposed: Yes Bone: Yes Fascia: No Tendon: No Muscle: No Joint: No Small (1-33%)] [N/A:N/A] Epithelialization: [1:Negative Pressure Wound Therapy] [N/A:N/A] Procedures Performed: [1:Maintenance (NPWT)] Treatment Notes Electronic Signature(s) Signed: 05/23/2020 4:43:21 PM By: Kela Millin Signed: 05/26/2020 8:03:52 AM By: Linton Ham MD Entered By: Linton Ham on 05/23/2020 14:54:54 -------------------------------------------------------------------------------- Multi-Disciplinary Care Plan Details Patient Name: Date of Service: [name] 05/23/2020 2:30 PM Medical Record Number: [id number] Patient Account Number: [id number] Date of Birth/Sex: Treating RN: 1961/06/20 (59 y.o. [name] Primary Care Veida Spira: [name] Other  Clinician: Referring Yanna Leaks: Treating Zerenity Bowron/Extender: [name] in Treatment: 2 Active Inactive Abuse / Safety / Falls / Self Care Management Nursing Diagnoses: Potential for falls Goals: Patient/caregiver will verbalize/demonstrate measures taken to prevent injury and/or falls Date Initiated: 05/07/2020 Target Resolution Date: 06/04/2020 Goal Status: Active Interventions: Assess fall risk on admission and as needed Assess impairment of mobility on admission and as needed per policy Notes: Necrotic Tissue Nursing Diagnoses: Impaired tissue integrity related to necrotic/devitalized tissue Knowledge deficit related to management of necrotic/devitalized tissue Goals: Necrotic/devitalized tissue will be minimized in the wound bed Date Initiated: 05/07/2020 Target Resolution Date: 06/04/2020 Goal Status: Active Patient/caregiver will verbalize understanding of reason and process for debridement of necrotic tissue Date Initiated: 05/07/2020 Target Resolution Date: 06/04/2020 Goal Status: Active Interventions: Assess patient pain level pre-, during and post procedure and prior to discharge Provide education on necrotic tissue and debridement process Treatment Activities: Apply topical anesthetic as ordered : 05/07/2020 Biologic debridement : 05/07/2020 Excisional debridement : 05/07/2020 Notes: Wound/Skin Impairment Nursing Diagnoses: Impaired tissue integrity Knowledge deficit related to smoking impact on wound healing Knowledge deficit related to ulceration/compromised skin integrity Goals: Patient will demonstrate a reduced rate of smoking or cessation of smoking Date Initiated: 05/07/2020 Target Resolution Date: 06/04/2020 Goal Status: Active Patient/caregiver will verbalize understanding of skin care regimen Date Initiated: 05/07/2020 Target Resolution Date: 06/04/2020 Goal Status: Active Ulcer/skin breakdown will have a volume reduction of 30% by  week 4 Date Initiated: 05/07/2020 Target Resolution Date: 06/04/2020 Goal Status: Active Interventions: Assess patient/caregiver ability to obtain necessary supplies Assess patient/caregiver ability to perform ulcer/skin care regimen upon admission and as needed Assess ulceration(s) every visit Provide education on ulcer and skin care Treatment Activities: Skin care regimen initiated : 05/07/2020 Topical wound management initiated : 05/07/2020 Notes: Electronic Signature(s) Signed: 05/23/2020 4:43:21 PM By: Kela Millin Entered By: Kela Millin on 05/23/2020 14:48:08 -------------------------------------------------------------------------------- Negative Pressure Wound Therapy Maintenance (NPWT) Details Patient Name: Date of Service: [name] 05/23/2020 2:30 PM Medical Record Number: [id number] Patient Account Number: [id number] Date of Birth/Sex: Treating RN: [birth year] (59 y.o. [name] Primary Care Shyniece Scripter: [name] Other Clinician: Referring Adrienne Delay: Treating Ferrell Flam/Extender: Tomasa Blase, Paulette Blanch in Treatment: 2 NPWT Maintenance Performed for: Wound #1 Left Amputation Site - Transmetatarsal Performed By: Kela Millin, RN Type: VAC System Coverage Size (sq cm): 22.32 Pressure Type: Constant Pressure Setting: 125 mmHG Drain Type: None Sponge/Dressing Type: Foam, Black Date Initiated: 05/20/2020 Dressing Removed: Yes Quantity of Sponges/Gauze Removed: 1 Canister Changed: No Dressing Reapplied: Yes Quantity of Sponges/Gauze Inserted: 1 Days On NPWT: 4 Post Procedure Diagnosis Same as Pre-procedure Electronic Signature(s) Signed: 05/23/2020 4:43:21 PM By: Kela Millin Entered By: Kela Millin on  05/23/2020 14:47:54 -------------------------------------------------------------------------------- Pain Assessment Details Patient Name: Date of Service: [name] 05/23/2020 2:30 PM Medical Record  Number: [id number] Patient Account Number: [id number] Date of Birth/Sex: Treating RN: [birth year] (59 y.o. Jerilynn Mages) Carlene Coria Primary Care Rishita Petron: [name] Other Clinician: Referring Raylen Ken: Treating Bralyn Espino/Extender: Tomasa Blase, Paulette Blanch in Treatment: 2 Active Problems Location of Pain Severity and Description of Pain Patient Has Paino No Site Locations Pain Management and Medication Current Pain Management: Electronic Signature(s) Signed: 05/23/2020 3:56:02 PM By: Carlene Coria RN Entered By: Carlene Coria on 05/23/2020 14:08:06 -------------------------------------------------------------------------------- Patient/Caregiver Education Details Patient Name: Date of Service: [name] 7/16/2021andnbsp2:30 PM Medical Record Number: [id number] Patient Account Number: [id number] Date of Birth/Gender: Treating RN: 04/05/1961 (59 y.o. [name] Primary Care Physician: [name] Other Clinician: Referring Physician: Treating Physician/Extender: [name] in Treatment: 2 Education Assessment Education Provided To: Patient Education Topics Provided Wound Debridement: Handouts: Wound Debridement Methods: Explain/Verbal Responses: State content correctly Wound/Skin Impairment: Handouts: Caring for Your Ulcer Methods: Explain/Verbal Responses: State content correctly Electronic Signature(s) Signed: 05/23/2020 4:43:21 PM By: Kela Millin Entered By: Kela Millin on 05/23/2020 14:48:25 -------------------------------------------------------------------------------- Wound Assessment Details Patient Name: Date of Service: [name] 05/23/2020 2:30 PM Medical Record Number: [id number] Patient Account Number: [id number] Date of Birth/Sex: Treating RN: 09/16/1961 (59 y.o. Oval Linsey Primary Care Montavius Subramaniam: [name] Other Clinician: Referring Jakwan Sally: Treating Miray Mancino/Extender: Tomasa Blase, Paulette Blanch in Treatment: 2 Wound Status Wound Number: 1 Primary Dehisced Wound Etiology: Wound Location: Left Amputation Site - Transmetatarsal Wound Open Wounding Event: Surgical Injury Status: Date Acquired: 03/21/2020 Comorbid Anemia, Arrhythmia, Congestive Heart Failure, Hypertension, Weeks Of Treatment: 2 History: Osteoarthritis, Osteomyelitis, Neuropathy, Received Chemotherapy Clustered Wound: No Wound Measurements Length: (cm) 3.1 Width: (cm) 7.2 Depth: (cm) 1.5 Area: (cm) 17.53 Volume: (cm) 26.295 % Reduction in Area: 50.9% % Reduction in Volume: 79% Epithelialization: Small (1-33%) Tunneling: No Undermining: No Wound Description Classification: Full Thickness With Exposed Support Structures Wound Margin: Distinct, outline attached Exudate Amount: Medium Exudate Type: Serosanguineous Exudate Color: red, brown Foul Odor After Cleansing: No Slough/Fibrino Yes Wound Bed Granulation Amount: Medium (34-66%) Exposed Structure Granulation Quality: Pink Fascia Exposed: No Necrotic Amount: Medium (34-66%) Fat Layer (Subcutaneous Tissue) Exposed: Yes Necrotic Quality: Eschar, Adherent Slough Tendon Exposed: No Muscle Exposed: No Joint Exposed: No Bone Exposed: Yes Treatment Notes Wound #1 (Left Amputation Site - Transmetatarsal) 2. Periwound Care Skin Prep 3. Primary Dressing Applied Collegen AG 4. Secondary Dressing Roll Gauze 6. Support Layer Psychologist, forensic Notes [employer] wound vac , 1 piece black foam inserted, pressure set at 125 mmHg with good seal Electronic Signature(s) Signed: 05/23/2020 3:56:02 PM By: Carlene Coria RN Entered By: Carlene Coria on 05/23/2020 14:17:18 -------------------------------------------------------------------------------- Vitals Details Patient Name: Date of Service: [name] 05/23/2020 2:30 PM Medical Record Number: [id number] Patient Account Number: [id number] Date of Birth/Sex: Treating  RN: [birth year] (59 y.o. Oval Linsey Primary Care Zygmund Passero: [name] Other Clinician: Referring Leiah Giannotti: Treating Saylee Sherrill/Extender: [name] in Treatment: 2 Vital Signs Time Taken: 14:07 Temperature (F): 97.9 Height (in): 69 Pulse (bpm): 91 Weight (lbs): 190 Respiratory Rate (breaths/min): 18 Body Mass Index (BMI): 28.1 Blood Pressure (mmHg): 115/81 Reference Range: 80 - 120 mg / dl Electronic Signature(s) Signed: 05/23/2020 3:56:02 PM By: Carlene Coria RN Entered By: Carlene Coria on 05/23/2020 14:07:59

## 2020-05-26 NOTE — Progress Notes (Signed)
[name], [name] ([id number]) Visit Report for 05/20/2020 SuperBill Details Patient Name: Date of Service: Leonard Bentley 05/20/2020 Medical Record Number: [id number] Patient Account Number: [id number] Date of Birth/Sex: Treating RN: 13-Jul-1961 (59 y.o. Janyth Contes Primary Care Provider: [name] Other Clinician: Referring Provider: Treating Provider/Extender: Tomasa Blase, Paulette Blanch in Treatment: 1 Diagnosis Coding ICD-10 Codes Code Description [phone] Unspecified open wound, left foot, initial encounter T81.31XA Disruption of external operation (surgical) wound, not elsewhere classified, initial encounter F17.218 Nicotine dependence, cigarettes, with other nicotine-induced disorders Facility Procedures CPT4 Code Description Modifier Quantity [id number] - WOUND VAC-50 SQ CM OR LESS 1 Electronic Signature(s) Signed: 05/20/2020 5:16:39 PM By: Linton Ham MD Signed: 05/26/2020 4:12:59 PM By: Levan Hurst RN, BSN Entered By: Levan Hurst on 05/20/2020 17:03:20

## 2020-05-28 ENCOUNTER — Other Ambulatory Visit: Payer: Self-pay

## 2020-05-28 ENCOUNTER — Encounter (HOSPITAL_BASED_OUTPATIENT_CLINIC_OR_DEPARTMENT_OTHER): Payer: Medicaid Other | Admitting: Physician Assistant

## 2020-05-28 DIAGNOSIS — T8131XA Disruption of external operation (surgical) wound, not elsewhere classified, initial encounter: Secondary | ICD-10-CM | POA: Diagnosis not present

## 2020-05-29 ENCOUNTER — Ambulatory Visit: Payer: Self-pay | Attending: Nurse Practitioner | Admitting: Pharmacist

## 2020-05-29 ENCOUNTER — Other Ambulatory Visit: Payer: Self-pay

## 2020-05-29 DIAGNOSIS — Z86711 Personal history of pulmonary embolism: Secondary | ICD-10-CM

## 2020-05-29 DIAGNOSIS — Z9889 Other specified postprocedural states: Secondary | ICD-10-CM

## 2020-05-29 LAB — POCT INR: INR: 5.3 — AB (ref 2.0–3.0)

## 2020-05-29 NOTE — Progress Notes (Signed)
ACXEL, DINGEE ([id number]) Visit Report for 05/28/2020 Arrival Information Details Patient Name: Date of Service: Leonard Bentley 05/28/2020 1:45 PM Medical Record Number: [id number] Patient Account Number: [id number] Date of Birth/Sex: Treating RN: [birth year] (59 y.o. Janyth Contes Primary Care Izella Ybanez: [name] Other Clinician: Referring Brinn Westby: Treating Matthe Sloane/Extender: Lenox Ponds Weeks in Treatment: 3 Visit Information History Since Last Visit Added or deleted any medications: No Patient Arrived: [name] Any new allergies or adverse reactions: No Arrival Time: 14:13 Had a fall or experienced change in No Accompanied By: alone activities of daily living that may affect Transfer Assistance: None risk of falls: Patient Identification Verified: Yes Signs or symptoms of abuse/neglect since last visito No Secondary Verification Process Completed: Yes Hospitalized since last visit: No Patient Requires Transmission-Based Precautions: No Implantable device outside of the clinic excluding No Patient Has Alerts: Yes cellular tissue based products placed in the center Patient Alerts: Patient on Blood Thinner since last visit: L ABI: 1.03 (01/2020) Has Dressing in Place as Prescribed: Yes Pain Present Now: No Electronic Signature(s) Signed: 05/29/2020 5:02:14 PM By: Levan Hurst RN, BSN Entered By: Levan Hurst on 05/28/2020 14:14:08 -------------------------------------------------------------------------------- Encounter Discharge Information Details Patient Name: Date of Service: Leonard Dunning A. 05/28/2020 1:45 PM Medical Record Number: [id number] Patient Account Number: [id number] Date of Birth/Sex: Treating RN: August 26, 1961 (59 y.o. Janyth Contes Primary Care Rhyleigh Grassel: [name] Other Clinician: Referring Duane Trias: Treating Hermon Zea/Extender: Lenox Ponds Weeks in Treatment: 3 Encounter Discharge Information  Items Discharge Condition: Stable Ambulatory Status: Walker Discharge Destination: Home Transportation: Private Auto Accompanied By: alone Schedule Follow-up Appointment: Yes Clinical Summary of Care: Patient Declined Electronic Signature(s) Signed: 05/29/2020 5:02:14 PM By: Levan Hurst RN, BSN Entered By: Levan Hurst on 05/28/2020 14:43:50 -------------------------------------------------------------------------------- Negative Pressure Wound Therapy Maintenance (NPWT) Details Patient Name: Date of Service: Leonard Bentley 05/28/2020 1:45 PM Medical Record Number: [id number] Patient Account Number: [id number] Date of Birth/Sex: Treating RN: May 31, 1961 (59 y.o. Janyth Contes Primary Care Rollen Selders: [name] Other Clinician: Referring Brisia Schuermann: Treating Lucciano Vitali/Extender: [name], [name] in Treatment: 3 NPWT Maintenance Performed for: Wound #1 Left Amputation Site - Transmetatarsal Performed By: Levan Hurst, RN Type: VAC System Coverage Size (sq cm): 22.32 Pressure Type: Constant Pressure Setting: 125 mmHG Drain Type: Primary Contact: Other : silver collagen Sponge/Dressing Type: Foam, Black Date Initiated: 05/20/2020 Dressing Removed: Yes Quantity of Sponges/Gauze Removed: 1 piece black foam removed Canister Changed: No Dressing Reapplied: Yes Quantity of Sponges/Gauze Inserted: 1 piece black foam applied Respones T Treatment: o pt tolerated well Days On NPWT : 9 Electronic Signature(s) Signed: 05/29/2020 5:02:14 PM By: Levan Hurst RN, BSN Entered By: Levan Hurst on 05/28/2020 14:40:36 -------------------------------------------------------------------------------- Wound Assessment Details Patient Name: Date of Service: Leonard Dunning A. 05/28/2020 1:45 PM Medical Record Number: [id number] Patient Account Number: [id number] Date of Birth/Sex: Treating RN: June 23, 1961 (59 y.o. Janyth Contes Primary Care Luay Balding: [name] Other Clinician: Referring Jilliane Kazanjian: Treating Siearra Amberg/Extender: [name], [name] in Treatment: 3 Wound Status Wound Number: 1 Primary Dehisced Wound Etiology: Wound Location: Left Amputation Site - Transmetatarsal Wound Open Wounding Event: Surgical Injury Status: Date Acquired: 03/21/2020 Comorbid Anemia, Arrhythmia, Congestive Heart Failure, Hypertension, Weeks Of Treatment: 3 History: Osteoarthritis, Osteomyelitis, Neuropathy, Received Clustered Wound: No Chemotherapy Wound Measurements Length: (cm) 3.1 Width: (cm) 7.2 Depth: (cm) 1.5 Area: (cm) 17.53 Volume: (cm) 26.295 % Reduction in Area: 50.9% % Reduction in Volume: 79%  Epithelialization: Small (1-33%) Tunneling: No Undermining: No Wound Description Classification: Full Thickness With Exposed Support Structures Wound Margin: Distinct, outline attached Exudate Amount: Medium Exudate Type: Serosanguineous Exudate Color: red, brown Wound Bed Granulation Amount: Medium (34-66%) Granulation Quality: Pink Necrotic Amount: Medium (34-66%) Necrotic Quality: Eschar, Adherent Slough Foul Odor After Cleansing: No Slough/Fibrino Yes Exposed Structure Fascia Exposed: No Fat Layer (Subcutaneous Tissue) Exposed: Yes Tendon Exposed: No Muscle Exposed: No Joint Exposed: No Bone Exposed: Yes Treatment Notes Wound #1 (Left Amputation Site - Transmetatarsal) 1. Cleanse With Wound Cleanser 2. Periwound Care Skin Prep 3. Primary Dressing Applied Collegen AG Other primary dressing (specifiy in notes) Notes KCI wound vac , 1 piece black foam inserted, pressure set at 125 mmHg with good seal Electronic Signature(s) Signed: 05/29/2020 5:02:14 PM By: Levan Hurst RN, BSN Entered By: Levan Hurst on 05/28/2020 14:39:43 -------------------------------------------------------------------------------- [place] Details Patient Name: Date of Service: Leonard Dunning A. 05/28/2020 1:45 PM Medical  Record Number: [id number] Patient Account Number: [id number] Date of Birth/Sex: Treating RN: 1961-07-05 (59 y.o. Janyth Contes Primary Care Kaelin Holford: [name] Other Clinician: Referring Hena Ewalt: Treating Rohin Krejci/Extender: [name], [name] in Treatment: 3 Vital Signs Time Taken: 14:14 Temperature (F): 98.3 Height (in): 69 Pulse (bpm): 92 Weight (lbs): 190 Respiratory Rate (breaths/min): 18 Body Mass Index (BMI): 28.1 Blood Pressure (mmHg): 132/91 Reference Range: 80 - 120 mg / dl Electronic Signature(s) Signed: 05/29/2020 5:02:14 PM By: Levan Hurst RN, BSN Entered By: Levan Hurst on 05/28/2020 14:15:25

## 2020-05-29 NOTE — Progress Notes (Signed)
JAMEIS, [place]SHAM ([id number]) Visit Report for 05/28/2020 SuperBill Details Patient Name: Date of Service: [name] 05/28/2020 Medical Record Number: [id number] Patient Account Number: [id number] Date of Birth/Sex: Treating RN: August 14, 1961 (59 y.o. Janyth Contes Primary Care Provider: [name] Other Clinician: Referring Provider: Treating Provider/Extender: [name], Army Melia Weeks in Treatment: 3 Diagnosis Coding ICD-10 Codes Code Description [phone] Unspecified open wound, left foot, initial encounter T81.31XA Disruption of external operation (surgical) wound, not elsewhere classified, initial encounter F17.218 Nicotine dependence, cigarettes, with other nicotine-induced disorders Facility Procedures CPT4 Code Description Modifier Quantity [id number] 97605 - WOUND VAC-50 SQ CM OR LESS 1 Electronic Signature(s) Signed: 05/28/2020 6:54:26 PM By: Worthy Keeler PA-C Signed: 05/29/2020 5:02:14 PM By: Levan Hurst RN, BSN Entered By: Levan Hurst on 05/28/2020 14:44:00

## 2020-05-30 ENCOUNTER — Encounter (HOSPITAL_BASED_OUTPATIENT_CLINIC_OR_DEPARTMENT_OTHER): Payer: Medicaid Other | Admitting: Internal Medicine

## 2020-05-30 DIAGNOSIS — T8131XA Disruption of external operation (surgical) wound, not elsewhere classified, initial encounter: Secondary | ICD-10-CM | POA: Diagnosis not present

## 2020-05-30 NOTE — Progress Notes (Signed)
TALLIS, SOLEDAD ([id number]) Visit Report for 05/30/2020 HPI Details Patient Name: Date of Service: Leonard Bentley 05/30/2020 2:30 PM Medical Record Number: [id number] Patient Account Number: [id number] Date of Birth/Sex: Treating RN: [birth year] (59 y.o. [name] Primary Care Provider: [name] Other Clinician: Referring Provider: Treating Provider/Extender: [name] in Treatment: 3 History of Present Illness HPI Description: 05/07/2020 upon evaluation today patient appears to be doing somewhat poorly in regard to a surgery site on his left foot which is a transmetatarsal amputation site. He actually had his second and third toes removed in March 2021. Unfortunately that did not heal appropriately and so he subsequently in May he had a transmetatarsal amputation performed by Dr. [name]. Subsequently this unfortunately also dehisced and though he still has sutures in place this has been about 6 weeks out and unfortunately the sutures are not holding anything together there is necrotic tissue that the sutures are actually embedded into and overall this really needs to be cleaned out and to be honest I think the patient really needs a wound VAC. With that being said there does not fortunately appear to be any signs of infection which is good news. Nonetheless I do believe this is definitely going to be a significant time to healing even with the wound VAC in order to get this completely closed. There is a lot of depth to the wound through the necrotic tissue and again a lot of the necrotic tissue needs to be removed we may be able to do some of that today I am not thinking we will get everything cleaned out however. No fevers, chills, nausea, vomiting, or diarrhea. The patient does have a history of nicotine dependence and we did discuss that in detail today but other than that he really has no major medical problems at this point other than what is going on  with his feet. In fact it really has not even been a determination as to why he is developing the wounds and having trouble healing his blood flow is good he does however smoke which could be affecting things in my opinion. 05/14/20 upon evaluation today patient appears to be doing better in my opinion. Overall the appearance of the wound is improved and I am very pleased with that. He has a tremendous amount of the necrotic tissue that between debridement last week, the use of Anasept in the interim, and debridement today has cleared away and again though he does have some depth this appears to be looking much more healthy. Overall I think a wound VAC would still do excellent for him even more so at this point. 7/16; patient I am seeing for the first time today. Considerable improvement in the surgical wound of the left TMA site. 4 days on a wound VAC. The majority of this is healthy granulated tissue. Some surface slough. Looking back through the pictures there is really been a tremendous improvement since his arrival in the clinic. He does not have a macrovascular issue. 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 . Silver collagen under the VAC. He is making nice [employer]) Signed: 05/30/2020 5:20:32 PM By: Linton Ham MD Entered By: Linton Ham on 05/30/2020 16:26:39 -------------------------------------------------------------------------------- Physical Exam Details Patient Name: Date of Service: Leonard Dunning A. 05/30/2020 2:30 PM Medical Record Number: [id number] Patient Account Number: [id number] Date of Birth/Sex: Treating RN: 1960-11-13 (59 y.o. Gay Filler, Larene [place]  Primary Care Provider: [name] Other Clinician: Referring Provider: Treating Provider/Extender: [name] in Treatment: 3 Constitutional Patient is hypertensive.. Pulse regular and within target range for patient.Marland Kitchen  Respirations regular, non-labored and within target range.. Temperature is normal and within the target range for the patient.Marland Kitchen Appears in no distress. Cardiovascular Pedal pulses are palpable. Notes Wound exam; healthy granulation. No sharp debridement. He has a divot in the middle of the wound which is greater in terms of depth at 0.8 cm. No surrounding infection no palpable deep tissue Electronic Signature(s) Signed: 05/30/2020 5:20:32 PM By: Linton Ham MD Entered By: Linton Ham on 05/30/2020 16:25:13 -------------------------------------------------------------------------------- Physician Orders Details Patient Name: Date of Service: Leonard Dunning A. 05/30/2020 2:30 PM Medical Record Number: [id number] Patient Account Number: [id number] Date of Birth/Sex: Treating RN: Apr 17, 1961 (59 y.o. [name] Primary Care Provider: [name] Other Clinician: Referring Provider: Treating Provider/Extender: [name] in Treatment: 3 Verbal / Phone Orders: No Diagnosis Coding ICD-10 Coding Code Description S91.302A Unspecified open wound, left foot, initial encounter T81.31XA Disruption of external operation (surgical) wound, not elsewhere classified, initial encounter F17.218 Nicotine dependence, cigarettes, with other nicotine-induced disorders Follow-up Appointments ppointment in 1 week. - MD Return A Nurse Visit: - Tuesday Dressing Change Frequency Wound #1 Left Amputation Site - Transmetatarsal Other: - twice a week Wound Cleansing Wound #1 Left Amputation Site - Transmetatarsal Clean wound with Wound Cleanser Primary Wound Dressing Wound #1 Left Amputation Site - Transmetatarsal Silver Collagen - to base Other: - wound vac Secondary Dressing Wound #1 Left Amputation Site - Transmetatarsal ABD pad Other: - ace wrap Negative Presssure Wound Therapy Wound Vac to wound continuously at 123mm/hg pressure Black Foam Edema  Control Avoid standing for long periods of time Elevate legs to the level of the heart or above for 30 minutes daily and/or when sitting, a frequency of: Off-Loading Other: - minimal weight bearing left foot Electronic Signature(s) Signed: 05/30/2020 5:20:32 PM By: Linton Ham MD Signed: 05/30/2020 5:51:32 PM By: Kela Millin Entered By: Kela Millin on 05/30/2020 15:57:06 -------------------------------------------------------------------------------- Problem List Details Patient Name: Date of Service: Leonard Dunning A. 05/30/2020 2:30 PM Medical Record Number: [id number] Patient Account Number: [id number] Date of Birth/Sex: Treating RN: May 07, 1961 (59 y.o. [name] Primary Care Provider: [name] Other Clinician: Referring Provider: Treating Provider/Extender: [name] in Treatment: 3 Active Problems ICD-10 Encounter Code Description Active Date MDM Diagnosis S91.302A Unspecified open wound, left foot, initial encounter 05/07/2020 No Yes T81.31XA Disruption of external operation (surgical) wound, not elsewhere classified, 05/07/2020 No Yes initial encounter F17.218 Nicotine dependence, cigarettes, with other nicotine-induced disorders 05/07/2020 No Yes Inactive Problems Resolved Problems Electronic Signature(s) Signed: 05/30/2020 5:20:32 PM By: Linton Ham MD Entered By: Linton Ham on 05/30/2020 16:23:01 -------------------------------------------------------------------------------- Progress Note Details Patient Name: Date of Service: Leonard Dunning A. 05/30/2020 2:30 PM Medical Record Number: [id number] Patient Account Number: [id number] Date of Birth/Sex: Treating RN: Apr 01, 1961 (59 y.o. [name] Primary Care Provider: [name] Other Clinician: Referring Provider: Treating Provider/Extender: Tomasa Blase, Paulette Blanch in Treatment: 3 Subjective History of Present Illness  (HPI) 05/07/2020 upon evaluation today patient appears to be doing somewhat poorly in regard to a surgery site on his left foot which is a transmetatarsal amputation site. He actually had his second and third toes removed in March 2021. Unfortunately that did not heal appropriately and so he subsequently in May he had a transmetatarsal amputation performed  by Dr. [name]. Subsequently this unfortunately also dehisced and though he still has sutures in place this has been about 6 weeks out and unfortunately the sutures are not holding anything together there is necrotic tissue that the sutures are actually embedded into and overall this really needs to be cleaned out and to be honest I think the patient really needs a wound VAC. With that being said there does not fortunately appear to be any signs of infection which is good news. Nonetheless I do believe this is definitely going to be a significant time to healing even with the wound VAC in order to get this completely closed. There is a lot of depth to the wound through the necrotic tissue and again a lot of the necrotic tissue needs to be removed we may be able to do some of that today I am not thinking we will get everything cleaned out however. No fevers, chills, nausea, vomiting, or diarrhea. The patient does have a history of nicotine dependence and we did discuss that in detail today but other than that he really has no major medical problems at this point other than what is going on with his feet. In fact it really has not even been a determination as to why he is developing the wounds and having trouble healing his blood flow is good he does however smoke which could be affecting things in my opinion. 05/14/20 upon evaluation today patient appears to be doing better in my opinion. Overall the appearance of the wound is improved and I am very pleased with that. He has a tremendous amount of the necrotic tissue that between debridement last week, the  use of Anasept in the interim, and debridement today has cleared away and again though he does have some depth this appears to be looking much more healthy. Overall I think a wound VAC would still do excellent for him even more so at this point. 7/16; patient I am seeing for the first time today. Considerable improvement in the surgical wound of the left TMA site. 4 days on a wound VAC. The majority of this is healthy granulated tissue. Some surface slough. Looking back through the pictures there is really been a tremendous improvement since his arrival in the clinic. He does not have a macrovascular issue. 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 He is using a snap VAC. Silver collagen on the VAC. He is making nice progress Objective Constitutional Patient is hypertensive.. Pulse regular and within target range for patient.Marland Kitchen Respirations regular, non-labored and within target range.. Temperature is normal and within the target range for the patient.Marland Kitchen Appears in no distress. Vitals Time Taken: 2:46 PM, Height: 69 in, Weight: 190 lbs, BMI: 28.1, Temperature: 98.4 F, Pulse: 92 bpm, Respiratory Rate: 18 breaths/min, Blood Pressure: 131/94 mmHg. Cardiovascular Pedal pulses are palpable. General Notes: Wound exam; healthy granulation. No sharp debridement. He has a divot in the middle of the wound which is greater in terms of depth at 0.8 cm. No surrounding infection no palpable deep tissue Integumentary (Hair, Skin) Wound #1 status is Open. Original cause of wound was Surgical Injury. The wound is located on the Left Amputation Site - Transmetatarsal. The wound measures 3.3cm length x 6cm width x 0.8cm depth; 15.551cm^2 area and 12.441cm^3 volume. There is bone and Fat Layer (Subcutaneous Tissue) Exposed exposed. There is no tunneling or undermining noted. There is a medium amount of serosanguineous drainage noted. The  wound margin is distinct with  the outline attached to the wound base. There is medium (34-66%) red granulation within the wound bed. There is a medium (34-66%) amount of necrotic tissue within the wound bed including Adherent Slough. Assessment Active Problems ICD-10 Unspecified open wound, left foot, initial encounter Disruption of external operation (surgical) wound, not elsewhere classified, initial encounter Nicotine dependence, cigarettes, with other nicotine-induced disorders Plan Follow-up Appointments: Return Appointment in 1 week. - MD Nurse Visit: - Tuesday Dressing Change Frequency: Wound #1 Left Amputation Site - Transmetatarsal: Other: - twice a week Wound Cleansing: Wound #1 Left Amputation Site - Transmetatarsal: Clean wound with Wound Cleanser Primary Wound Dressing: Wound #1 Left Amputation Site - Transmetatarsal: Silver Collagen - to base Other: - wound vac Secondary Dressing: Wound #1 Left Amputation Site - Transmetatarsal: ABD pad Other: - ace wrap Negative Presssure Wound Therapy: Wound Vac to wound continuously at 145mm/hg pressure Black Foam Edema Control: Avoid standing for long periods of time Elevate legs to the level of the heart or above for 30 minutes daily and/or when sitting, a frequency of: Off-Loading: Other: - minimal weight bearing left foot 1. We continued with with the wound VAC collagen under black foam. Electronic Signature(s) Signed: 05/30/2020 5:20:32 PM By: Linton Ham MD Entered By: Linton Ham on 05/30/2020 16:25:54 -------------------------------------------------------------------------------- SuperBill Details Patient Name: Date of Service: Leonard Dunning A. 05/30/2020 Medical Record Number: [id number] Patient Account Number: [id number] Date of Birth/Sex: Treating RN: December 16, 1960 (59 y.o. [name] Primary Care Provider: [name] Other Clinician: Referring Provider: Treating Provider/Extender: [name]  in Treatment: 3 Diagnosis Coding ICD-10 Codes Code Description [id number] Unspecified open wound, left foot, initial encounter T81.31XA Disruption of external operation (surgical) wound, not elsewhere classified, initial encounter F17.218 Nicotine dependence, cigarettes, with other nicotine-induced disorders Facility Procedures CPT4 Code: [id number] Description: 55374 - WOUND VAC-50 SQ CM OR LESS Modifier: Quantity: 1 Physician Procedures : CPT4 Code Description Modifier [id number] - WC PHYS LEVEL 3 - EST PT ICD-10 Diagnosis Description T81.31XA Disruption of external operation (surgical) wound, not elsewhere classified, initial encounter S91.302A Unspecified open wound, left foot,  initial encounter Quantity: 1 Electronic Signature(s) Signed: 05/30/2020 5:20:32 PM By: Linton Ham MD Entered By: Linton Ham on 05/30/2020 16:27:08

## 2020-06-03 ENCOUNTER — Encounter (HOSPITAL_BASED_OUTPATIENT_CLINIC_OR_DEPARTMENT_OTHER): Payer: Medicaid Other | Admitting: Internal Medicine

## 2020-06-04 ENCOUNTER — Encounter (HOSPITAL_BASED_OUTPATIENT_CLINIC_OR_DEPARTMENT_OTHER): Payer: Medicaid Other | Admitting: Physician Assistant

## 2020-06-04 DIAGNOSIS — T8131XA Disruption of external operation (surgical) wound, not elsewhere classified, initial encounter: Secondary | ICD-10-CM | POA: Diagnosis not present

## 2020-06-04 NOTE — Progress Notes (Signed)
ASHUTOSH, DIEGUEZ ([id number]) Visit Report for 05/30/2020 Arrival Information Details Patient Name: Date of Service: [name] 05/30/2020 2:30 PM Medical Record Number: [id number] Patient Account Number: [id number] Date of Birth/Sex: Treating RN: 1961/03/16 (59 y.o. Janyth Contes Primary Care Ilo Beamon: [name] Other Clinician: Referring Lessa Huge: Treating Quantavius Humm/Extender: [name] in Treatment: 3 Visit Information History Since Last Visit Added or deleted any medications: No Patient Arrived: [name] Any new allergies or adverse reactions: No Arrival Time: 14:43 Had a fall or experienced change in No Accompanied By: alone activities of daily living that may affect Transfer Assistance: None risk of falls: Patient Identification Verified: Yes Signs or symptoms of abuse/neglect since last visito No Secondary Verification Process Completed: Yes Hospitalized since last visit: No Patient Requires Transmission-Based Precautions: No Implantable device outside of the clinic excluding No Patient Has Alerts: Yes cellular tissue based products placed in the center Patient Alerts: Patient on Blood Thinner since last visit: L ABI: 1.03 (01/2020) Has Dressing in Place as Prescribed: Yes Pain Present Now: Yes Electronic Signature(s) Signed: 06/04/2020 6:13:34 PM By: Levan Hurst RN, BSN Entered By: Levan Hurst on 05/30/2020 14:44:33 -------------------------------------------------------------------------------- Encounter Discharge Information Details Patient Name: Date of Service: [name] 05/30/2020 2:30 PM Medical Record Number: [id number] Patient Account Number: [id number] Date of Birth/Sex: Treating RN: [birth year] (59 y.o. Oval Linsey Primary Care Darvin Dials: [name] Other Clinician: Referring Kielee Care: Treating Girtha Kilgore/Extender: [name] in Treatment: 3 Encounter Discharge Information  Items Discharge Condition: Stable Ambulatory Status: Ambulatory Discharge Destination: Home Transportation: Private Auto Accompanied By: self Schedule Follow-up Appointment: Yes Clinical Summary of Care: Patient Declined Electronic Signature(s) Signed: 05/30/2020 5:42:58 PM By: Carlene Coria RN Entered By: Carlene Coria on 05/30/2020 16:12:29 -------------------------------------------------------------------------------- Lower Extremity Assessment Details Patient Name: Date of Service: [name] 05/30/2020 2:30 PM Medical Record Number: [id number] Patient Account Number: [id number] Date of Birth/Sex: Treating RN: 07-07-1961 (59 y.o. Janyth Contes Primary Care Naziah Weckerly: [name] Other Clinician: Referring Shantese Raven: Treating Esvin Hnat/Extender: Tomasa Blase, Paulette Blanch in Treatment: 3 Edema Assessment Assessed: [Left: No] [Right: No] Edema: [Left: Ye] [Right: s] Calf Left: Right: Point of Measurement: 31 cm From Medial Instep 31 cm cm Ankle Left: Right: Point of Measurement: 11 cm From Medial Instep 27 cm cm Vascular Assessment Pulses: Dorsalis Pedis Palpable: [Left:Yes] Electronic Signature(s) Signed: 06/04/2020 6:13:34 PM By: Levan Hurst RN, BSN Entered By: Levan Hurst on 05/30/2020 14:59:15 -------------------------------------------------------------------------------- Multi Wound Chart Details Patient Name: Date of Service: [name] 05/30/2020 2:30 PM Medical Record Number: [id number] Patient Account Number: [id number] Date of Birth/Sex: Treating RN: Jul 28, 1961 (58 y.o. [name] Primary Care Avani Sensabaugh: [name] Other Clinician: Referring Jarick Harkins: Treating Jashan Cotten/Extender: Tomasa Blase, Paulette Blanch in Treatment: 3 Vital Signs Height(in): 69 Pulse(bpm): 92 Weight(lbs): 190 Blood Pressure(mmHg): 131/94 Body Mass Index(BMI): 28 Temperature(F): 98.4 Respiratory Rate(breaths/min): 18 Photos:  [1:No Photos Left Amputation Site - Transmetatarsal N/A] [N/A:N/A] Wound Location: [1:Surgical Injury] [N/A:N/A] Wounding Event: [1:Dehisced Wound] [N/A:N/A] Primary Etiology: [1:Anemia, Arrhythmia, Congestive Heart N/A] Comorbid History: [1:Failure, Hypertension, Osteoarthritis, Osteomyelitis, Neuropathy, Received Chemotherapy 03/21/2020] [N/A:N/A] Date Acquired: [1:3] [N/A:N/A] Weeks of Treatment: [1:Open] [N/A:N/A] Wound Status: [1:3.3x6x0.8] [N/A:N/A] Measurements L x W x D (cm) [1:15.551] [N/A:N/A] A (cm) : rea [1:12.441] [N/A:N/A] Volume (cm) : [1:56.50%] [N/A:N/A] % Reduction in Area: [1:90.10%] [N/A:N/A] % Reduction in Volume: [1:Full Thickness With Exposed Support] [N/A:N/A] Classification: [1:Structures Medium] [N/A:N/A] Exudate Amount: [1:Serosanguineous] [N/A:N/A] Exudate Type: [1:red,  brown] [N/A:N/A] Exudate Color: [1:Distinct, outline attached] [N/A:N/A] Wound Margin: [1:Medium (34-66%)] [N/A:N/A] Granulation Amount: [1:Red] [N/A:N/A] Granulation Quality: [1:Medium (34-66%)] [N/A:N/A] Necrotic Amount: [1:Fat Layer (Subcutaneous Tissue)] [N/A:N/A] Exposed Structures: [1:Exposed: Yes Bone: Yes Fascia: No Tendon: No Muscle: No Joint: No Small (1-33%)] [N/A:N/A] Epithelialization: [1:Negative Pressure Wound Therapy] [N/A:N/A] Procedures Performed: [1:Maintenance (NPWT)] Treatment Notes Wound #1 (Left Amputation Site - Transmetatarsal) 1. Cleanse With Wound Cleanser 3. Primary Dressing Applied Col[employer] Notes KCI wound vac , 1 piece black foam inserted, pressure set at 125 mmHg with good seal Electronic Signature(s) Signed: 05/30/2020 5:20:32 PM By: Linton Ham MD Signed: 05/30/2020 5:51:32 PM By: Kela Millin Entered By: Linton Ham on 05/30/2020 16:23:17 -------------------------------------------------------------------------------- Multi-Disciplinary Care Plan Details Patient Name: Date of Service: [name] 05/30/2020 2:30 PM Medical Record  Number: [id number] Patient Account Number: [id number] Date of Birth/Sex: Treating RN: 07-Mar-1961 (59 y.o. [name] Primary Care Tish Begin: [name] Other Clinician: Referring Taleya Whitcher: Treating Vada Yellen/Extender: [name] in Treatment: 3 Active Inactive Abuse / Safety / Falls / Self Care Management Nursing Diagnoses: Potential for falls Goals: Patient/caregiver will verbalize/demonstrate measures taken to prevent injury and/or falls Date Initiated: 05/07/2020 Target Resolution Date: 06/04/2020 Goal Status: Active Interventions: Assess fall risk on admission and as needed Assess impairment of mobility on admission and as needed per policy Notes: Necrotic Tissue Nursing Diagnoses: Impaired tissue integrity related to necrotic/devitalized tissue Knowledge deficit related to management of necrotic/devitalized tissue Goals: Necrotic/devitalized tissue will be minimized in the wound bed Date Initiated: 05/07/2020 Target Resolution Date: 06/04/2020 Goal Status: Active Patient/caregiver will verbalize understanding of reason and process for debridement of necrotic tissue Date Initiated: 05/07/2020 Target Resolution Date: 06/04/2020 Goal Status: Active Interventions: Assess patient pain level pre-, during and post procedure and prior to discharge Provide education on necrotic tissue and debridement process Treatment Activities: Apply topical anesthetic as ordered : 05/07/2020 Biologic debridement : 05/07/2020 Excisional debridement : 05/07/2020 Notes: Wound/Skin Impairment Nursing Diagnoses: Impaired tissue integrity Knowledge deficit related to smoking impact on wound healing Knowledge deficit related to ulceration/compromised skin integrity Goals: Patient will demonstrate a reduced rate of smoking or cessation of smoking Date Initiated: 05/07/2020 Target Resolution Date: 06/04/2020 Goal Status: Active Patient/caregiver will verbalize  understanding of skin care regimen Date Initiated: 05/07/2020 Target Resolution Date: 06/04/2020 Goal Status: Active Ulcer/skin breakdown will have a volume reduction of 30% by week 4 Date Initiated: 05/07/2020 Target Resolution Date: 06/04/2020 Goal Status: Active Interventions: Assess patient/caregiver ability to obtain necessary supplies Assess patient/caregiver ability to perform ulcer/skin care regimen upon admission and as needed Assess ulceration(s) every visit Provide education on ulcer and skin care Treatment Activities: Skin care regimen initiated : 05/07/2020 Topical wound management initiated : 05/07/2020 Notes: Electronic Signature(s) Signed: 05/30/2020 5:51:32 PM By: Kela Millin Entered By: Kela Millin on 05/30/2020 15:34:51 -------------------------------------------------------------------------------- Negative Pressure Wound Therapy Maintenance (NPWT) Details Patient Name: Date of Service: [name] 05/30/2020 2:30 PM Medical Record Number: [id number] Patient Account Number: [id number] Date of Birth/Sex: Treating RN: 1961-09-29 (59 y.o. [name] Primary Care Birch Farino: [name] Other Clinician: Referring Rosbel Buckner: Treating Thersia Petraglia/Extender: Tomasa Blase, Paulette Blanch in Treatment: 3 NPWT Maintenance Performed for: Wound #1 Left Amputation Site - Transmetatarsal Performed By: Kela Millin, RN Type: VAC System Coverage Size (sq cm): 19.8 Pressure Type: Constant Pressure Setting: 125 mmHG Drain Type: Primary Contact: Other : Sponge/Dressing Type: Foam, Black Date Initiated: 05/20/2020 Dressing Removed: Yes Quantity of Sponges/Gauze Removed: 1 Canister Changed: No Dressing  Reapplied: Yes Quantity of Sponges/Gauze Inserted: 1 Days On NPWT : 11 Post Procedure Diagnosis Same as Pre-procedure Electronic Signature(s) Signed: 05/30/2020 5:51:32 PM By: Kela Millin Entered By: Kela Millin on 05/30/2020  15:56:26 -------------------------------------------------------------------------------- Pain Assessment Details Patient Name: Date of Service: [name] 05/30/2020 2:30 PM Medical Record Number: [id number] Patient Account Number: [id number] Date of Birth/Sex: Treating RN: Aug 15, 1961 (59 y.o. Janyth Contes Primary Care Ambree Frances: [name] Other Clinician: Referring Javeah Loeza: Treating Shamir Sedlar/Extender: [name] in Treatment: 3 Active Problems Location of Pain Severity and Description of Pain Patient Has Paino Yes Site Locations Pain Location: Pain in Ulcers With Dressing Change: Yes Duration of the Pain. Constant / Intermittento Intermittent Rate the pain. Current Pain Level: 3 Character of Pain Describe the Pain: Aching, Dull Pain Management and Medication Current Pain Management: Medication: Yes Cold Application: No Rest: No Massage: No Activity: No T.E.N.S.: No Heat Application: No Leg drop or elevation: No Is the Current Pain Management Adequate: Adequate How does your wound impact your activities of daily livingo Sleep: No Bathing: No Appetite: No Relationship With Others: No Bladder Continence: No Emotions: No Bowel Continence: No Work: No Toileting: No Drive: No Dressing: No Hobbies: No Electronic Signature(s) Signed: 06/04/2020 6:13:34 PM By: Levan Hurst RN, BSN Entered By: Levan Hurst on 05/30/2020 14:47:25 -------------------------------------------------------------------------------- Patient/Caregiver Education Details Patient Name: Date of Service: [name] 7/23/2021andnbsp2:30 PM Medical Record Number: [id number] Patient Account Number: [id number] Date of Birth/Gender: Treating RN: [birth year] (59 y.o. [name] Primary Care Physician: [name] Other Clinician: Referring Physician: Treating Physician/Extender: [name] in Treatment:  3 Education Assessment Education Provided To: Patient Education Topics Provided Wound Debridement: Handouts: Wound Debridement Methods: Explain/Verbal Responses: State content correctly Wound/Skin Impairment: Handouts: Caring for Your Ulcer Methods: Explain/Verbal Responses: State content correctly Electronic Signature(s) Signed: 05/30/2020 5:51:32 PM By: Kela Millin Entered By: Kela Millin on 05/30/2020 15:35:10 -------------------------------------------------------------------------------- Wound Assessment Details Patient Name: Date of Service: [name] 05/30/2020 2:30 PM Medical Record Number: [id number] Patient Account Number: [id number] Date of Birth/Sex: Treating RN: 01/30/1961 (59 y.o. Janyth Contes Primary Care Bracen Schum: [name] Other Clinician: Referring Antjuan Rothe: Treating Hanalei Glace/Extender: Tomasa Blase, Paulette Blanch in Treatment: 3 Wound Status Wound Number: 1 Primary Dehisced Wound Etiology: Wound Location: Left Amputation Site - Transmetatarsal Wound Open Wounding Event: Surgical Injury Status: Date Acquired: 03/21/2020 Comorbid Anemia, Arrhythmia, Congestive Heart Failure, Hypertension, Weeks Of Treatment: 3 History: Osteoarthritis, Osteomyelitis, Neuropathy, Received Chemotherapy Clustered Wound: No Photos Photo Uploaded By: Mikeal Hawthorne on 06/02/2020 13:32:34 Wound Measurements Length: (cm) 3.3 Width: (cm) 6 Depth: (cm) 0.8 Area: (cm) 15.551 Volume: (cm) 12.441 % Reduction in Area: 56.5% % Reduction in Volume: 90.1% Epithelialization: Small (1-33%) Tunneling: No Undermining: No Wound Description Classification: Full Thickness With Exposed Support Structures Wound Margin: Distinct, outline attached Exudate Amount: Medium Exudate Type: Serosanguineous Exudate Color: red, brown Foul Odor After Cleansing: No Slough/Fibrino Yes Wound Bed Granulation Amount: Medium (34-66%) Exposed  Structure Granulation Quality: Red Fascia Exposed: No Necrotic Amount: Medium (34-66%) Fat Layer (Subcutaneous Tissue) Exposed: Yes Necrotic Quality: Adherent Slough Tendon Exposed: No Muscle Exposed: No Joint Exposed: No Bone Exposed: Yes Electronic Signature(s) Signed: 06/04/2020 6:13:34 PM By: Levan Hurst RN, BSN Entered By: Levan Hurst on 05/30/2020 14:57:30 -------------------------------------------------------------------------------- [place] Details Patient Name: Date of Service: [name] 05/30/2020 2:30 PM Medical Record Number: [id number] Patient Account Number: [id number] Date of Birth/Sex: Treating RN: 1961-04-10 (59 y.o. M)  Levan Hurst Primary Care Campbell Agramonte: [name] Other Clinician: Referring Juneau Doughman: Treating Maryna Yeagle/Extender: [name] in Treatment: 3 Vital Signs Time Taken: 14:46 Temperature (F): 98.4 Height (in): 69 Pulse (bpm): 92 Weight (lbs): 190 Respiratory Rate (breaths/min): 18 Body Mass Index (BMI): 28.1 Blood Pressure (mmHg): 131/94 Reference Range: 80 - 120 mg / dl Electronic Signature(s) Signed: 06/04/2020 6:13:34 PM By: Levan Hurst RN, BSN Entered By: Levan Hurst on 05/30/2020 14:47:02

## 2020-06-04 NOTE — Progress Notes (Signed)
XAVIAR, LUNN ([id number]) Visit Report for 06/04/2020 [place] Details Patient Name: Date of Service: [name] 06/04/2020 Medical Record Number: [id number] Patient Account Number: [id number] Date of Birth/Sex: Treating RN: 1961-05-19 (59 y.o. [name] Primary Care Provider: [name] Other Clinician: Referring Provider: Treating Provider/Extender: [name], Army Melia Weeks in Treatment: 4 Diagnosis Coding ICD-10 Codes Code Description [phone] Unspecified open wound, left foot, initial encounter T81.31XA Disruption of external operation (surgical) wound, not elsewhere classified, initial encounter F17.218 Nicotine dependence, cigarettes, with other nicotine-induced disorders Facility Procedures CPT4 Code Description Modifier Quantity [id number] - WOUND VAC-50 SQ CM OR LESS 1 Electronic Signature(s) Signed: 06/04/2020 6:38:59 PM By: [name] RN, BSN Signed: 06/04/2020 6:41:43 PM By: Worthy Keeler PA-C Entered By: [name] on 06/04/2020 08:01:28

## 2020-06-04 NOTE — Progress Notes (Signed)
Leonard Bentley, Leonard Bentley ([id number]) Visit Report for 06/04/2020 Arrival Information Details Patient Name: Date of Service: Leonard Bentley 06/04/2020 8:45 A M Medical Record Number: [id number] Patient Account Number: [id number] Date of Birth/Sex: Treating RN: [birth year] (59 Bentley.o. [name], [name] Primary Care Elmore Hyslop: [name] Other Clinician: Referring Bode Pieper: Treating Daley Gosse/Extender: Lenox Ponds Bentley in Treatment: 4 Visit Information History Since Last Visit Added or deleted any medications: No Patient Arrived: Leonard Bentley Any new allergies or adverse reactions: No Arrival Time: 07:50 Had a fall or experienced change in Yes Accompanied By: self activities of daily living that may affect Transfer Assistance: None risk of falls: Patient Identification Verified: Yes Signs or symptoms of abuse/neglect since No Secondary Verification Process Completed: Yes last visito Patient Requires Transmission-Based Precautions: No Hospitalized since last visit: No Patient Has Alerts: Yes Implantable device outside of the clinic No Patient Alerts: Patient on Blood Thinner excluding L ABI: 1.03 (01/2020) cellular tissue based products placed in the center since last visit: Has Dressing in Place as Prescribed: Yes Has Footwear/Offloading in Place as Yes Prescribed: Left: Surgical Shoe with Pressure Relief Insole Pain Present Now: Yes Electronic Signature(s) Signed: 06/04/2020 6:38:59 PM By: [name] RN, BSN Entered By: [name] on 06/04/2020 07:51:25 -------------------------------------------------------------------------------- Encounter Discharge Information Details Patient Name: Date of Service: Leonard Dunning A. 06/04/2020 8:45 A M Medical Record Number: [id number] Patient Account Number: [id number] Date of Birth/Sex: Treating RN: [birth year] (65 Bentley.o. Leonard Bentley Primary Care Nolan Lasser: [name] Other Clinician: Referring Crystian Frith: Treating  Leonard Bentley/Extender: Lenox Ponds Bentley in Treatment: 4 Encounter Discharge Information Items Discharge Condition: Stable Ambulatory Status: Ambulatory Discharge Destination: Home Transportation: Private Auto Accompanied By: self Schedule Follow-up Appointment: Yes Clinical Summary of Care: Patient Declined Electronic Signature(s) Signed: 06/04/2020 6:38:59 PM By: [name] RN, BSN Entered By: [name] on 06/04/2020 08:01:19 -------------------------------------------------------------------------------- Negative Pressure Wound Therapy Maintenance (NPWT) Details Patient Name: Date of Service: Leonard Bentley 06/04/2020 8:45 A M Medical Record Number: [id number] Patient Account Number: [id number] Date of Birth/Sex: Treating RN: 15-Apr-1961 (26 Bentley.o. Leonard Bentley Primary Care Leonard Bentley: [name] Other Clinician: Referring Leonard Bentley: Treating Leonard Bentley/Extender: [name], Leonard Bentley in Treatment: 4 NPWT Maintenance Performed for: Wound #1 Left Amputation Site - Transmetatarsal Performed By: [name], RN Type: VAC System Coverage Size (sq cm): 19.8 Pressure Type: Constant Pressure Setting: 125 mmHG Drain Type: Sponge/Dressing Type: Foam, Black Date Initiated: 05/20/2020 Dressing Removed: No Quantity of Sponges/Gauze Removed: 1 Canister Changed: No Dressing Reapplied: No Quantity of Sponges/Gauze Inserted: 1 Days On NPWT : 16 Electronic Signature(s) Signed: 06/04/2020 6:38:59 PM By: [name] RN, BSN Entered By: [name] on 06/04/2020 07:59:46 -------------------------------------------------------------------------------- Pain Assessment Details Patient Name: Date of Service: Leonard Dunning A. 06/04/2020 8:45 A M Medical Record Number: [id number] Patient Account Number: [id number] Date of Birth/Sex: Treating RN: [birth year] (22 Bentley.o. Leonard Bentley Primary Care Leonard Bentley: [name] Other  Clinician: Referring Leonard Bentley: Treating Leonard Bentley/Extender: Lenox Ponds Bentley in Treatment: 4 Active Problems Location of Pain Severity and Description of Pain Patient Has Paino Yes Site Locations Pain Location: Pain in Ulcers With Dressing Change: Yes Duration of the Pain. Constant / Intermittento Intermittent Rate the pain. Current Pain Level: 3 Worst Pain Level: 5 Least Pain Level: 0 Character of Pain Describe the Pain: Aching Pain Management and Medication Current Pain Management: Medication: Yes Is the Current Pain Management Adequate: Adequate How does your wound impact  your activities of daily livingo Sleep: No Bathing: No Appetite: No Relationship With Others: No Bladder Continence: No Emotions: No Bowel Continence: No Hobbies: No Toileting: No Dressing: No Electronic Signature(s) Signed: 06/04/2020 6:38:59 PM By: [name] RN, BSN Entered By: [name] on 06/04/2020 07:52:14 -------------------------------------------------------------------------------- Patient/Caregiver Education Details Patient Name: Date of Service: Leonard Bentley, Leonard E A. 7/28/2021andnbsp8:45 A M Medical Record Number: [id number] Patient Account Number: [id number] Date of Birth/Gender: Treating RN: 1960-11-25 (52 Bentley.o. Leonard Bentley Primary Care Physician: [name] Other Clinician: Referring Physician: Treating Physician/Extender: Leonard Bentley in Treatment: 4 Education Assessment Education Provided To: Patient Education Topics Provided Wound/Skin Impairment: Methods: Explain/Verbal Responses: State content correctly Electronic Signature(s) Signed: 06/04/2020 6:38:59 PM By: [name] RN, BSN Entered By: [name] on 06/04/2020 08:01:00 -------------------------------------------------------------------------------- Wound Assessment Details Patient Name: Date of Service: Leonard Dunning A. 06/04/2020 8:45 A  M Medical Record Number: [id number] Patient Account Number: [id number] Date of Birth/Sex: Treating RN: 1961/03/13 (64 Bentley.o. Leonard Bentley Primary Care Leonard Bentley: [name] Other Clinician: Referring Leonard Bentley: Treating Leonard Bentley/Extender: [name], Leonard Bentley in Treatment: 4 Wound Status Wound Number: 1 Primary Dehisced Wound Etiology: Wound Location: Left Amputation Site - Transmetatarsal Wound Open Wounding Event: Surgical Injury Status: Date Acquired: 03/21/2020 Comorbid Anemia, Arrhythmia, Congestive Heart Failure, Hypertension, Bentley Of Treatment: 4 History: Osteoarthritis, Osteomyelitis, Neuropathy, Received Clustered Wound: No Chemotherapy Wound Measurements Length: (cm) 3.3 Width: (cm) 6 Depth: (cm) 0.8 Area: (cm) 15.551 Volume: (cm) 12.441 % Reduction in Area: 56.5% % Reduction in Volume: 90.1% Epithelialization: Small (1-33%) Tunneling: No Undermining: No Wound Description Classification: Full Thickness With Exposed Support Structures Wound Margin: Distinct, outline attached Exudate Amount: Medium Exudate Type: Serosanguineous Exudate Color: red, brown Foul Odor After Cleansing: No Slough/Fibrino Yes Wound Bed Granulation Amount: Medium (34-66%) Exposed Structure Granulation Quality: Red Fascia Exposed: No Necrotic Amount: Medium (34-66%) Fat Layer (Subcutaneous Tissue) Exposed: Yes Necrotic Quality: Adherent Slough Tendon Exposed: No Muscle Exposed: No Joint Exposed: No Bone Exposed: Yes Treatment Notes Wound #1 (Left Amputation Site - Transmetatarsal) 1. Cleanse With Wound Cleanser Soap and water 3. Primary Dressing Applied Collagen Notes KCI wound vac , 1 piece black foam inserted, pressure set at 125 mmHg with good seal Electronic Signature(s) Signed: 06/04/2020 6:38:59 PM By: [name] RN, BSN Entered By: [name] on 06/04/2020  07:58:37 -------------------------------------------------------------------------------- [place] Details Patient Name: Date of Service: Leonard Dunning A. 06/04/2020 8:45 A M Medical Record Number: [id number] Patient Account Number: [id number] Date of Birth/Sex: Treating RN: October 26, 1961 (38 Bentley.o. Leonard Bentley Primary Care Kregg Cihlar: [name] Other Clinician: Referring Arvie Villarruel: Treating Ginnette Gates/Extender: [name], Leonard Bentley in Treatment: 4 Vital Signs Time Taken: 07:53 Temperature (F): 97.8 Height (in): 69 Pulse (bpm): 88 Source: Stated Respiratory Rate (breaths/min): 18 Weight (lbs): 190 Blood Pressure (mmHg): 120/86 Source: Stated Reference Range: 80 - 120 mg / dl Body Mass Index (BMI): 28.1 Electronic Signature(s) Signed: 06/04/2020 6:38:59 PM By: [name] RN, BSN Signed: 06/04/2020 6:38:59 PM By: [name] RN, BSN Entered By: [name] on 06/04/2020 07:54:31

## 2020-06-05 ENCOUNTER — Ambulatory Visit: Payer: Self-pay | Attending: Nurse Practitioner | Admitting: Pharmacist

## 2020-06-05 ENCOUNTER — Other Ambulatory Visit: Payer: Self-pay

## 2020-06-05 DIAGNOSIS — Z86711 Personal history of pulmonary embolism: Secondary | ICD-10-CM

## 2020-06-05 DIAGNOSIS — Z9889 Other specified postprocedural states: Secondary | ICD-10-CM

## 2020-06-05 LAB — POCT INR: INR: 3.2 — AB (ref 2.0–3.0)

## 2020-06-06 ENCOUNTER — Encounter (HOSPITAL_BASED_OUTPATIENT_CLINIC_OR_DEPARTMENT_OTHER): Payer: Medicaid Other | Admitting: Physician Assistant

## 2020-06-06 DIAGNOSIS — T8131XA Disruption of external operation (surgical) wound, not elsewhere classified, initial encounter: Secondary | ICD-10-CM | POA: Diagnosis not present

## 2020-06-06 NOTE — Progress Notes (Addendum)
SANTHOSH, GULINO ([id number]) Visit Report for 06/06/2020 Chief Complaint Document Details Patient Name: Date of Service: Leonard Bentley 06/06/2020 2:45 PM Medical Record Number: [id number] Patient Account Number: [id number] Date of Birth/Sex: Treating RN: 1961/06/18 (59 y.o. [name] Primary Care Provider: [name] Other Clinician: Referring Provider: Treating Provider/Extender: [name], Army Melia Weeks in Treatment: 4 Information Obtained from: Patient Chief Complaint Left foot surgical ulcer Electronic Signature(s) Signed: 06/06/2020 2:47:29 PM By: Worthy Keeler PA-C Entered By: Worthy Keeler on 06/06/2020 14:47:29 -------------------------------------------------------------------------------- Debridement Details Patient Name: Date of Service: Leonard Bentley 06/06/2020 2:45 PM Medical Record Number: [id number] Patient Account Number: [id number] Date of Birth/Sex: Treating RN: Oct 17, 1961 (59 y.o. [name] Primary Care Provider: [name] Other Clinician: Referring Provider: Treating Provider/Extender: Lenox Ponds Weeks in Treatment: 4 Debridement Performed for Assessment: Wound #1 Left Amputation Site - Transmetatarsal Performed By: Physician Worthy Keeler, PA Debridement Type: Debridement Level of Consciousness (Pre-procedure): Awake and Alert Pre-procedure Verification/Time Out Yes - 15:30 Taken: Start Time: 15:30 Pain Control: Other : benzocaine, 20% T Area Debrided (L x W): otal 2 (cm) x 2 (cm) = 4 (cm) Tissue and other material debrided: Viable, Non-Viable, Subcutaneous Level: Skin/Subcutaneous Tissue Debridement Description: Excisional Instrument: Curette Bleeding: Minimum Hemostasis Achieved: Pressure End Time: 15:31 Procedural Pain: 0 Post Procedural Pain: 0 Response to Treatment: Procedure was tolerated well Level of Consciousness (Post- Awake and Alert procedure): Post Debridement  Measurements of Total Wound Length: (cm) 3 Width: (cm) 4.1 Depth: (cm) 0.5 Volume: (cm) 4.83 Character of Wound/Ulcer Post Debridement: Improved Post Procedure Diagnosis Same as Pre-procedure Electronic Signature(s) Signed: 06/06/2020 5:02:39 PM By: Kela Millin Signed: 06/06/2020 5:19:16 PM By: Worthy Keeler PA-C Entered By: Kela Millin on 06/06/2020 15:31:22 -------------------------------------------------------------------------------- HPI Details Patient Name: Date of Service: Leonard Dunning A. 06/06/2020 2:45 PM Medical Record Number: [id number] Patient Account Number: [id number] Date of Birth/Sex: Treating RN: 09-20-1961 (59 y.o. [name] Primary Care Provider: [name] Other Clinician: Referring Provider: Treating Provider/Extender: Lenox Ponds Weeks in Treatment: 4 History of Present Illness HPI Description: 05/07/2020 upon evaluation today patient appears to be doing somewhat poorly in regard to a surgery site on his left foot which is a transmetatarsal amputation site. He actually had his second and third toes removed in March 2021. Unfortunately that did not heal appropriately and so he subsequently in May he had a transmetatarsal amputation performed by Dr. [name]. Subsequently this unfortunately also dehisced and though he still has sutures in place this has been about 6 weeks out and unfortunately the sutures are not holding anything together there is necrotic tissue that the sutures are actually embedded into and overall this really needs to be cleaned out and to be honest I think the patient really needs a wound VAC. With that being said there does not fortunately appear to be any signs of infection which is good news. Nonetheless I do believe this is definitely going to be a significant time to healing even with the wound VAC in order to get this completely closed. There is a lot of depth to the wound through the necrotic  tissue and again a lot of the necrotic tissue needs to be removed we may be able to do some of that today I am not thinking we will get everything cleaned out however. No fevers, chills, nausea, vomiting, or diarrhea. The patient does have a history of nicotine  dependence and we did discuss that in detail today but other than that he really has no major medical problems at this point other than what is going on with his feet. In fact it really has not even been a determination as to why he is developing the wounds and having trouble healing his blood flow is good he does however smoke which could be affecting things in my opinion. 05/14/20 upon evaluation today patient appears to be doing better in my opinion. Overall the appearance of the wound is improved and I am very pleased with that. He has a tremendous amount of the necrotic tissue that between debridement last week, the use of Anasept in the interim, and debridement today has cleared away and again though he does have some depth this appears to be looking much more healthy. Overall I think a wound VAC would still do excellent for him even more so at this point. 7/16; patient I am seeing for the first time today. Considerable improvement in the surgical wound of the left TMA site. 4 days on a wound VAC. The majority of this is healthy granulated tissue. Some surface slough. Looking back through the pictures there is really been a tremendous improvement since his arrival in the clinic. He does not have a macrovascular issue. 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 . Silver collagen under the VAC. He is making nice progress 06/06/2020 on evaluation today patient actually appears to be doing quite well with regard to his wound. He is making good progress and very pleased in that regard. There is no signs of active infection at this time which is also good news. He seems to be tolerating the wound VAC  without any complication and I am seeing excellent improvement especially compared to where things started when I first saw him here in the clinic. He is very pleased with where things stand. Electronic Signature(s) Signed: 06/06/2020 3:53:59 PM By: Worthy Keeler PA-C Entered By: Worthy Keeler on 06/06/2020 15:53:58 -------------------------------------------------------------------------------- Physical Exam Details Patient Name: Date of Service: Leonard Bentley 06/06/2020 2:45 PM Medical Record Number: [id number] Patient Account Number: [id number] Date of Birth/Sex: Treating RN: December 26, 1960 (59 y.o. [name] Primary Care Provider: [name] Other Clinician: Referring Provider: Treating Provider/Extender: [name], [name] in Treatment: 4 Constitutional Well-nourished and well-hydrated in no acute distress. Respiratory normal breathing without difficulty. Psychiatric this patient is able to make decisions and demonstrates good insight into disease process. Alert and Oriented x 3. pleasant and cooperative. Notes Upon inspection patient's wound bed actually showed signs of excellent granulation and very pleased at this time and overall I feel like the wound VAC is doing a great job. There was some necrotic debris that did require sharp debridement today I did perform debridement to clear this away and post debridement wound bed appears to be significantly better. I really think everything else is fairly granulated in and overall I think he is doing quite well. Electronic Signature(s) Signed: 06/06/2020 3:54:22 PM By: Worthy Keeler PA-C Entered By: Worthy Keeler on 06/06/2020 15:54:21 -------------------------------------------------------------------------------- Physician Orders Details Patient Name: Date of Service: Leonard Bentley 06/06/2020 2:45 PM Medical Record Number: [id number] Patient Account Number: [id number] Date of Birth/Sex: Treating  RN: [birth year] (59 y.o. [name] Primary Care Provider: [name] Other Clinician: Referring Provider: Treating Provider/Extender: [name], [name] in Treatment: 4 Verbal /  Phone Orders: No Diagnosis Coding ICD-10 Coding Code Description V76.160V Unspecified open wound, left foot, initial encounter T81.31XA Disruption of external operation (surgical) wound, not elsewhere classified, initial encounter F17.218 Nicotine dependence, cigarettes, with other nicotine-induced disorders Follow-up Appointments ppointment in 1 week. - MD Return A Nurse Visit: - Monday Dressing Change Frequency Wound #1 Left Amputation Site - Transmetatarsal Other: - twice a week Wound Cleansing Wound #1 Left Amputation Site - Transmetatarsal Clean wound with Wound Cleanser Primary Wound Dressing Wound #1 Left Amputation Site - Transmetatarsal Silver Collagen - to base Other: - wound vac Secondary Dressing Wound #1 Left Amputation Site - Transmetatarsal ABD pad Other: - ace wrap Negative Presssure Wound Therapy Wound Vac to wound continuously at 157mm/hg pressure Black Foam Edema Control Avoid standing for long periods of time Elevate legs to the level of the heart or above for 30 minutes daily and/or when sitting, a frequency of: Off-Loading Other: - minimal weight bearing left foot Electronic Signature(s) Signed: 06/06/2020 5:02:39 PM By: Kela Millin Signed: 06/06/2020 5:19:16 PM By: Worthy Keeler PA-C Entered By: Kela Millin on 06/06/2020 15:32:10 -------------------------------------------------------------------------------- Problem List Details Patient Name: Date of Service: Leonard Dunning A. 06/06/2020 2:45 PM Medical Record Number: [id number] Patient Account Number: [id number] Date of Birth/Sex: Treating RN: 05-13-1961 (59 y.o. [name] Primary Care Provider: [name] Other Clinician: Referring Provider: Treating  Provider/Extender: Lenox Ponds Weeks in Treatment: 4 Active Problems ICD-10 Encounter Code Description Active Date MDM Diagnosis S91.302A Unspecified open wound, left foot, initial encounter 05/07/2020 No Yes T81.31XA Disruption of external operation (surgical) wound, not elsewhere classified, 05/07/2020 No Yes initial encounter F17.218 Nicotine dependence, cigarettes, with other nicotine-induced disorders 05/07/2020 No Yes Inactive Problems Resolved Problems Electronic Signature(s) Signed: 06/06/2020 2:47:17 PM By: Worthy Keeler PA-C Entered By: Worthy Keeler on 06/06/2020 14:47:17 -------------------------------------------------------------------------------- Progress Note Details Patient Name: Date of Service: Leonard Dunning A. 06/06/2020 2:45 PM Medical Record Number: [id number] Patient Account Number: [id number] Date of Birth/Sex: Treating RN: [birth year] (59 y.o. [name] Primary Care Provider: [name] Other Clinician: Referring Provider: Treating Provider/Extender: Lenox Ponds Weeks in Treatment: 4 Subjective Chief Complaint Information obtained from Patient Left foot surgical ulcer History of Present Illness (HPI) 05/07/2020 upon evaluation today patient appears to be doing somewhat poorly in regard to a surgery site on his left foot which is a transmetatarsal amputation site. He actually had his second and third toes removed in March 2021. Unfortunately that did not heal appropriately and so he subsequently in May he had a transmetatarsal amputation performed by Dr. [name]. Subsequently this unfortunately also dehisced and though he still has sutures in place this has been about 6 weeks out and unfortunately the sutures are not holding anything together there is necrotic tissue that the sutures are actually embedded into and overall this really needs to be cleaned out and to be honest I think the patient really needs a  wound VAC. With that being said there does not fortunately appear to be any signs of infection which is good news. Nonetheless I do believe this is definitely going to be a significant time to healing even with the wound VAC in order to get this completely closed. There is a lot of depth to the wound through the necrotic tissue and again a lot of the necrotic tissue needs to be removed we may be able to do some of that today I am not thinking we will get  everything cleaned out however. No fevers, chills, nausea, vomiting, or diarrhea. The patient does have a history of nicotine dependence and we did discuss that in detail today but other than that he really has no major medical problems at this point other than what is going on with his feet. In fact it really has not even been a determination as to why he is developing the wounds and having trouble healing his blood flow is good he does however smoke which could be affecting things in my opinion. 05/14/20 upon evaluation today patient appears to be doing better in my opinion. Overall the appearance of the wound is improved and I am very pleased with that. He has a tremendous amount of the necrotic tissue that between debridement last week, the use of Anasept in the interim, and debridement today has cleared away and again though he does have some depth this appears to be looking much more healthy. Overall I think a wound VAC would still do excellent for him even more so at this point. 7/16; patient I am seeing for the first time today. Considerable improvement in the surgical wound of the left TMA site. 4 days on a wound VAC. The majority of this is healthy granulated tissue. Some surface slough. Looking back through the pictures there is really been a tremendous improvement since his arrival in the clinic. He does not have a macrovascular issue. 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 . Silver collagen  under the VAC. He is making nice progress 06/06/2020 on evaluation today patient actually appears to be doing quite well with regard to his wound. He is making good progress and very pleased in that regard. There is no signs of active infection at this time which is also good news. He seems to be tolerating the wound VAC without any complication and I am seeing excellent improvement especially compared to where things started when I first saw him here in the clinic. He is very pleased with where things stand. Objective Constitutional Well-nourished and well-hydrated in no acute distress. Vitals Time Taken: 3:21 PM, Height: 69 in, Weight: 190 lbs, BMI: 28.1, Temperature: 97.1 F, Pulse: 103 bpm, Respiratory Rate: 18 breaths/min, Blood Pressure: 100/66 mmHg. Respiratory normal breathing without difficulty. Psychiatric this patient is able to make decisions and demonstrates good insight into disease process. Alert and Oriented x 3. pleasant and cooperative. General Notes: Upon inspection patient's wound bed actually showed signs of excellent granulation and very pleased at this time and overall I feel like the wound VAC is doing a great job. There was some necrotic debris that did require sharp debridement today I did perform debridement to clear this away and post debridement wound bed appears to be significantly better. I really think everything else is fairly granulated in and overall I think he is doing quite well. Integumentary (Hair, Skin) Wound #1 status is Open. Original cause of wound was Surgical Injury. The wound is located on the Left Amputation Site - Transmetatarsal. The wound measures 3cm length x 4.1cm width x 0.5cm depth; 9.66cm^2 area and 4.83cm^3 volume. Assessment Active Problems ICD-10 Unspecified open wound, left foot, initial encounter Disruption of external operation (surgical) wound, not elsewhere classified, initial encounter Nicotine dependence, cigarettes, with other  nicotine-induced disorders Procedures Wound #1 Pre-procedure diagnosis of Wound #1 is a Dehisced Wound located on the Left Amputation Site - Transmetatarsal . There was a Excisional Skin/Subcutaneous Tissue Debridement with a total area  of 4 sq cm performed by Worthy Keeler, PA. With the following instrument(s): Curette to remove Viable and Non-Viable tissue/material. Material removed includes Subcutaneous Tissue after achieving pain control using Other (benzocaine, 20%). No specimens were taken. A time out was conducted at 15:30, prior to the start of the procedure. A Minimum amount of bleeding was controlled with Pressure. The procedure was tolerated well with a pain level of 0 throughout and a pain level of 0 following the procedure. Post Debridement Measurements: 3cm length x 4.1cm width x 0.5cm depth; 4.83cm^3 volume. Character of Wound/Ulcer Post Debridement is improved. Post procedure Diagnosis Wound #1: Same as Pre-Procedure Plan Follow-up Appointments: Return Appointment in 1 week. - MD Nurse Visit: - Monday Dressing Change Frequency: Wound #1 Left Amputation Site - Transmetatarsal: Other: - twice a week Wound Cleansing: Wound #1 Left Amputation Site - Transmetatarsal: Clean wound with Wound Cleanser Primary Wound Dressing: Wound #1 Left Amputation Site - Transmetatarsal: Silver Collagen - to base Other: - wound vac Secondary Dressing: Wound #1 Left Amputation Site - Transmetatarsal: ABD pad Other: - ace wrap Negative Presssure Wound Therapy: Wound Vac to wound continuously at 123mm/hg pressure Black Foam Edema Control: Avoid standing for long periods of time Elevate legs to the level of the heart or above for 30 minutes daily and/or when sitting, a frequency of: Off-Loading: Other: - minimal weight bearing left foot 1. I would recommend currently that he continue with the wound care measures as before with regard to the wound VAC along with the collagen based  dressing I think both of these are doing a great job. 2. I am also can recommend the patient continue to be monitored for any signs of infection or worsening obviously I see nothing at this point that the problem and overall I think he is doing quite well. 3. I am also going to suggest he continue to limit his walking is much as possible he does not have to be completely nonweightbearing but nonetheless does need to be somewhat careful in that regard. We will see patient back for reevaluation in 1 week here in the clinic. If anything worsens or changes patient will contact our office for additional recommendations. Electronic Signature(s) Signed: 06/06/2020 3:55:02 PM By: Worthy Keeler PA-C Entered By: Worthy Keeler on 06/06/2020 15:55:02 -------------------------------------------------------------------------------- SuperBill Details Patient Name: Date of Service: Leonard Bentley 06/06/2020 Medical Record Number: [id number] Patient Account Number: [id number] Date of Birth/Sex: Treating RN: 18-Sep-1961 (59 y.o. [name] Primary Care Provider: [name] Other Clinician: Referring Provider: Treating Provider/Extender: [name], Army Melia Weeks in Treatment: 4 Diagnosis Coding ICD-10 Codes Code Description [phone] Unspecified open wound, left foot, initial encounter T81.31XA Disruption of external operation (surgical) wound, not elsewhere classified, initial encounter F17.218 Nicotine dependence, cigarettes, with other nicotine-induced disorders Facility Procedures CPT4 Code: [id number] Description: 11042 - DEB SUBQ TISSUE 20 SQ CM/< ICD-10 Diagnosis Description S91.302A Unspecified open wound, left foot, initial encounter T81.31XA Disruption of external operation (surgical) wound, not elsewhere classified, initial Modifier: encounter Quantity: 1 CPT4 Code: [id number] I Description: 605 - WOUND VAC-50 SQ CM OR LESS CD-10 Diagnosis Description S91.302A  Unspecified open wound, left foot, initial encounter T81.31XA Disruption of external operation (surgical) wound, not elsewhere classified, initial Modifier: 1 encounter Quantity: Physician Procedures : CPT4 Code Description Modifier [id number] 11042 - WC PHYS SUBQ TISS 20 SQ CM ICD-10 Diagnosis Description S91.302A Unspecified open wound, left foot, initial encounter T81.31XA Disruption of external operation (surgical) wound, not  elsewhere classified,  initial encounter Quantity: 1 Electronic Signature(s) Signed: 06/06/2020 3:55:51 PM By: Worthy Keeler PA-C Previous Signature: 06/06/2020 3:55:25 PM Version By: Worthy Keeler PA-C Entered By: Worthy Keeler on 06/06/2020 15:55:50

## 2020-06-09 ENCOUNTER — Encounter (HOSPITAL_BASED_OUTPATIENT_CLINIC_OR_DEPARTMENT_OTHER): Payer: Medicaid Other | Attending: Internal Medicine | Admitting: Internal Medicine

## 2020-06-09 DIAGNOSIS — I110 Hypertensive heart disease with heart failure: Secondary | ICD-10-CM | POA: Diagnosis not present

## 2020-06-09 DIAGNOSIS — Z86711 Personal history of pulmonary embolism: Secondary | ICD-10-CM | POA: Insufficient documentation

## 2020-06-09 DIAGNOSIS — Z89422 Acquired absence of other left toe(s): Secondary | ICD-10-CM | POA: Diagnosis not present

## 2020-06-09 DIAGNOSIS — Z7901 Long term (current) use of anticoagulants: Secondary | ICD-10-CM | POA: Diagnosis not present

## 2020-06-09 DIAGNOSIS — I509 Heart failure, unspecified: Secondary | ICD-10-CM | POA: Insufficient documentation

## 2020-06-09 DIAGNOSIS — G629 Polyneuropathy, unspecified: Secondary | ICD-10-CM | POA: Insufficient documentation

## 2020-06-09 DIAGNOSIS — F17218 Nicotine dependence, cigarettes, with other nicotine-induced disorders: Secondary | ICD-10-CM | POA: Insufficient documentation

## 2020-06-09 DIAGNOSIS — M1990 Unspecified osteoarthritis, unspecified site: Secondary | ICD-10-CM | POA: Insufficient documentation

## 2020-06-09 DIAGNOSIS — T8131XA Disruption of external operation (surgical) wound, not elsewhere classified, initial encounter: Secondary | ICD-10-CM | POA: Insufficient documentation

## 2020-06-09 DIAGNOSIS — S91302A Unspecified open wound, left foot, initial encounter: Secondary | ICD-10-CM | POA: Insufficient documentation

## 2020-06-09 DIAGNOSIS — X58XXXA Exposure to other specified factors, initial encounter: Secondary | ICD-10-CM | POA: Diagnosis not present

## 2020-06-10 ENCOUNTER — Other Ambulatory Visit: Payer: Self-pay | Admitting: Family Medicine

## 2020-06-10 NOTE — Telephone Encounter (Signed)
Requested medication (s) are due for refill today: Dose varies  Requested medication (s) are on the active medication list: yes  Last refill: 05/14/20  #60  0 refills  Future visit scheduled yes  06/12/20  Notes to clinic: not delegated  Requested Prescriptions  Pending Prescriptions Disp Refills   warfarin (COUMADIN) 5 MG tablet [Pharmacy Med Name: WARFARIN SOD 5MG  TABLETS (PEACH)] 60 tablet 0    Sig: TAKE AS DIRECTED BY COUMADIN CLINIC      Hematology:  Anticoagulants - warfarin Failed - 06/10/2020  3:08 AM      Failed - This refill cannot be delegated      Failed - If the patient is managed by Coumadin Clinic - route to their Pool. If not, forward to the provider.      Failed - INR in normal range and within 30 days    INR  Date Value Ref Range Status  06/05/2020 3.2 (A) 2.0 - 3.0 Final  04/04/2020 2.0 (H) 0.8 - 1.2 Final    Comment:    (NOTE) INR goal varies based on device and disease states. Performed at [facility], [place] [address], [place], [place] [address]           Failed - Valid encounter within last 3 months    Recent Outpatient Visits           3 months ago Excessive cerumen in both ear canals   [place], [place] J, NP   7 months ago GAD (generalized anxiety disorder)   [place] [name], NP   10 months ago [place], Vernia Buff, NP   1 year ago SVT (supraventricular tachycardia) [facility])   [place] [name], NP   1 year ago Personal history of pulmonary embolism   [place], Dionne Bucy, [place]       Future Appointments             In 2 days Daisy Blossom, Jarome Matin, [place]   In 6 days [name], NP [place]

## 2020-06-11 NOTE — Progress Notes (Signed)
JAMAAL, BERNASCONI ([id number]) Visit Report for 06/06/2020 Arrival Information Details Patient Name: Date of Service: Leonard Bentley 06/06/2020 2:45 PM Medical Record Number: [id number] Patient Account Number: [id number] Date of Birth/Sex: Treating RN: 1961-05-10 (59 y.o. Leonard Bentley Primary Care Melayah Skorupski: [name] Other Clinician: Referring Iban Utz: Treating Lark Runk/Extender: Lenox Ponds Weeks in Treatment: 4 Visit Information History Since Last Visit Added or deleted any medications: No Patient Arrived: [name] Any new allergies or adverse reactions: No Arrival Time: 15:21 Had a fall or experienced change in No Accompanied By: self activities of daily living that may affect Transfer Assistance: None risk of falls: Patient Identification Verified: Yes Signs or symptoms of abuse/neglect since last visito No Secondary Verification Process Completed: Yes Hospitalized since last visit: No Patient Requires Transmission-Based Precautions: No Implantable device outside of the clinic excluding No Patient Has Alerts: Yes cellular tissue based products placed in the center Patient Alerts: Patient on Blood Thinner since last visit: L ABI: 1.03 (01/2020) Has Dressing in Place as Prescribed: Yes Pain Present Now: Yes Electronic Signature(s) Signed: 06/11/2020 8:00:11 AM By: Sandre Kitty Entered By: Sandre Kitty on 06/06/2020 15:21:49 -------------------------------------------------------------------------------- Lower Extremity Assessment Details Patient Name: Date of Service: Leonard Bentley 06/06/2020 2:45 PM Medical Record Number: [id number] Patient Account Number: [id number] Date of Birth/Sex: Treating RN: [birth year] (59 y.o. Leonard Bentley Primary Care Cooper Stamp: [name] Other Clinician: Referring Verenice Westrich: Treating Aileene Lanum/Extender: [name], [name] in Treatment: 4 Edema Assessment Assessed: [Left: No] [Right:  No] Edema: [Left: Ye] [Right: s] Calf Left: Right: Point of Measurement: 31 cm From Medial Instep 31 cm cm Ankle Left: Right: Point of Measurement: 11 cm From Medial Instep 27 cm cm Vascular Assessment Pulses: Dorsalis Pedis Palpable: [Left:Yes] Electronic Signature(s) Signed: 06/06/2020 5:02:39 PM By: Kela Millin Entered By: Kela Millin on 06/06/2020 15:29:06 -------------------------------------------------------------------------------- Multi-Disciplinary Care Plan Details Patient Name: Date of Service: Leonard Dunning A. 06/06/2020 2:45 PM Medical Record Number: [id number] Patient Account Number: [id number] Date of Birth/Sex: Treating RN: Sep 07, 1961 (59 y.o. Leonard Bentley Primary Care Deseri Loss: [name] Other Clinician: Referring Clell Trahan: Treating Darius Lundberg/Extender: [name], [employer] Melia Weeks in Treatment: 4 Active Inactive Abuse / Safety / Falls / Self Care Management Nursing Diagnoses: Potential for falls Goals: Patient/caregiver will verbalize/demonstrate measures taken to prevent injury and/or falls Date Initiated: 05/07/2020 Target Resolution Date: 07/04/2020 Goal Status: Active Interventions: Assess fall risk on admission and as needed Assess impairment of mobility on admission and as needed per policy Notes: Necrotic Tissue Nursing Diagnoses: Impaired tissue integrity related to necrotic/devitalized tissue Knowledge deficit related to management of necrotic/devitalized tissue Goals: Necrotic/devitalized tissue will be minimized in the wound bed Date Initiated: 05/07/2020 Target Resolution Date: 07/04/2020 Goal Status: Active Patient/caregiver will verbalize understanding of reason and process for debridement of necrotic tissue Date Initiated: 05/07/2020 Target Resolution Date: 07/04/2020 Goal Status: Active Interventions: Assess patient pain level pre-, during and post procedure and prior to discharge Provide education on  necrotic tissue and debridement process Treatment Activities: Apply topical anesthetic as ordered : 05/07/2020 Biologic debridement : 05/07/2020 Excisional debridement : 05/07/2020 Notes: Wound/Skin Impairment Nursing Diagnoses: Impaired tissue integrity Knowledge deficit related to smoking impact on wound healing Knowledge deficit related to ulceration/compromised skin integrity Goals: Patient will demonstrate a reduced rate of smoking or cessation of smoking Date Initiated: 05/07/2020 Target Resolution Date: 06/04/2020 Goal Status: Active Patient/caregiver will verbalize understanding of skin care regimen Date Initiated: 05/07/2020 Target Resolution Date: 06/04/2020  Goal Status: Active Ulcer/skin breakdown will have a volume reduction of 30% by week 4 Date Initiated: 05/07/2020 Target Resolution Date: 06/04/2020 Goal Status: Active Interventions: Assess patient/caregiver ability to obtain necessary supplies Assess patient/caregiver ability to perform ulcer/skin care regimen upon admission and as needed Assess ulceration(s) every visit Provide education on ulcer and skin care Treatment Activities: Skin care regimen initiated : 05/07/2020 Topical wound management initiated : 05/07/2020 Notes: Electronic Signature(s) Signed: 06/06/2020 5:02:39 PM By: Kela Millin Entered By: Kela Millin on 06/06/2020 15:33:08 -------------------------------------------------------------------------------- Negative Pressure Wound Therapy Maintenance (NPWT) Details Patient Name: Date of Service: Leonard Bentley 06/06/2020 2:45 PM Medical Record Number: [id number] Patient Account Number: [id number] Date of Birth/Sex: Treating RN: [birth year] (59 y.o. Leonard Bentley Primary Care Meighan Treto: [name] Other Clinician: Referring Britiney Blahnik: Treating Alesi Zachery/Extender: [name], [name] in Treatment: 4 NPWT Maintenance Performed for: Wound #1 Left Amputation Site -  Transmetatarsal Performed By: Kela Millin, RN Type: VAC System Coverage Size (sq cm): 12.3 Pressure Type: Constant Pressure Setting: 125 mmHG Drain Type: Sponge/Dressing Type: Foam, Black Date Initiated: 05/20/2020 Dressing Removed: Yes Quantity of Sponges/Gauze Removed: 1 Canister Changed: No Dressing Reapplied: Yes Quantity of Sponges/Gauze Inserted: 1 Days On NPWT : 18 Post Procedure Diagnosis Same as Pre-procedure Electronic Signature(s) Signed: 06/06/2020 5:02:39 PM By: Kela Millin Entered By: Kela Millin on 06/06/2020 15:29:45 -------------------------------------------------------------------------------- Pain Assessment Details Patient Name: Date of Service: Leonard Dunning A. 06/06/2020 2:45 PM Medical Record Number: [id number] Patient Account Number: [id number] Date of Birth/Sex: Treating RN: 1960/11/11 (59 y.o. Leonard Bentley Primary Care Mina Babula: [name] Other Clinician: Referring Gizzelle Lacomb: Treating Zoeya Gramajo/Extender: [name], Army Melia Weeks in Treatment: 4 Active Problems Location of Pain Severity and Description of Pain Patient Has Paino No Site Locations Pain Management and Medication Current Pain Management: Electronic Signature(s) Signed: 06/06/2020 5:02:39 PM By: Kela Millin Signed: 06/11/2020 8:00:11 AM By: Sandre Kitty Entered By: Sandre Kitty on 06/06/2020 15:22:16 -------------------------------------------------------------------------------- Wound Assessment Details Patient Name: Date of Service: Leonard Dunning A. 06/06/2020 2:45 PM Medical Record Number: [id number] Patient Account Number: [id number] Date of Birth/Sex: Treating RN: May 13, 1961 (59 y.o. Leonard Bentley Primary Care Arlett Goold: [name] Other Clinician: Referring Dioselina Brumbaugh: Treating Dilyn Smiles/Extender: [name], [name] in Treatment: 4 Wound Status Wound Number: 1 Primary Etiology: Dehisced  Wound Wound Location: Left Amputation Site - Transmetatarsal Wound Status: Open Wounding Event: Surgical Injury Date Acquired: 03/21/2020 Weeks Of Treatment: 4 Clustered Wound: No Wound Measurements Length: (cm) 3 Width: (cm) 4.1 Depth: (cm) 0.5 Area: (cm) 9.66 Volume: (cm) 4.83 % Reduction in Area: 73% % Reduction in Volume: 96.1% Wound Description Classification: Full Thickness With Exposed Support Structures Electronic Signature(s) Signed: 06/06/2020 5:02:39 PM By: Kela Millin Signed: 06/11/2020 8:00:11 AM By: Sandre Kitty Entered By: Da Katrinka Blazing on 06/06/2020 15:27:22 -------------------------------------------------------------------------------- Vitals Details Patient Name: Date of Service: Leonard Dunning A. 06/06/2020 2:45 PM Medical Record Number: [id number] Patient Account Number: [id number] Date of Birth/Sex: Treating RN: 10-24-1961 (59 y.o. Leonard Bentley Primary Care Kaely Hollan: [name] Other Clinician: Referring Fredi Geiler: Treating Sharlette Jansma/Extender: [name], [name] in Treatment: 4 Vital Signs Time Taken: 15:21 Temperature (F): 97.1 Height (in): 69 Pulse (bpm): 103 Weight (lbs): 190 Respiratory Rate (breaths/min): 18 Body Mass Index (BMI): 28.1 Blood Pressure (mmHg): 100/66 Reference Range: 80 - 120 mg / dl Electronic Signature(s) Signed: 06/11/2020 8:00:11 AM By: Sandre Kitty Entered By: Sandre Kitty on 06/06/2020 15:22:06

## 2020-06-11 NOTE — Progress Notes (Signed)
[name], [name] ([id number]) Visit Report for 06/09/2020 Arrival Information Details Patient Name: Date of Service: Leonard Bentley 06/09/2020 9:00 A M Medical Record Number: [id number] Patient Account Number: [id number] Date of Birth/Sex: Treating RN: August 25, 1961 (59 y.o. Leonard Bentley) Carlene Coria Primary Care Jeyla Bulger: [name] Other Clinician: Referring Takeela Peil: Treating Sanii Kukla/Extender: [name] in Treatment: 4 Visit Information History Since Last Visit All ordered tests and consults were completed: No Patient Arrived: Leonard Bentley Added or deleted any medications: No Arrival Time: 07:58 Any new allergies or adverse reactions: No Accompanied By: self Had a fall or experienced change in No Transfer Assistance: None activities of daily living that may affect Patient Identification Verified: Yes risk of falls: Secondary Verification Process Completed: Yes Signs or symptoms of abuse/neglect since last visito No Patient Requires Transmission-Based Precautions: No Hospitalized since last visit: No Patient Has Alerts: Yes Implantable device outside of the clinic excluding No Patient Alerts: Patient on Blood Thinner cellular tissue based products placed in the center L ABI: 1.03 (01/2020) since last visit: Has Dressing in Place as Prescribed: Yes Pain Present Now: No Electronic Signature(s) Signed: 06/11/2020 4:35:51 PM By: Carlene Coria RN Entered By: Carlene Coria on 06/09/2020 07:58:59 -------------------------------------------------------------------------------- Encounter Discharge Information Details Patient Name: Date of Service: Leonard Dunning A. 06/09/2020 9:00 A M Medical Record Number: [id number] Patient Account Number: [id number] Date of Birth/Sex: Treating RN: [birth year] (59 y.o. Leonard Bentley Primary Care Lauretta Sallas: [name] Other Clinician: Referring Kennady Zimmerle: Treating Madalyne Husk/Extender: [name] in Treatment:  4 Encounter Discharge Information Items Discharge Condition: Stable Ambulatory Status: Walker Discharge Destination: Home Transportation: Private Auto Accompanied By: self Schedule Follow-up Appointment: Yes Clinical Summary of Care: Patient Declined Electronic Signature(s) Signed: 06/11/2020 4:35:51 PM By: Carlene Coria RN Entered By: Carlene Coria on 06/09/2020 08:31:10 -------------------------------------------------------------------------------- Patient/Caregiver Education Details Patient Name: Date of Service: Leonard Bentley 8/2/2021andnbsp9:00 A M Medical Record Number: [id number] Patient Account Number: [id number] Date of Birth/Gender: Treating RN: 1961/01/12 (59 y.o. Leonard Bentley Primary Care Physician: [name] Other Clinician: Referring Physician: Treating Physician/Extender: [name] in Treatment: 4 Education Assessment Education Provided To: Patient Education Topics Provided Wound/Skin Impairment: Methods: Explain/Verbal Responses: State content correctly Electronic Signature(s) Signed: 06/11/2020 4:35:51 PM By: Carlene Coria RN Entered By: Carlene Coria on 06/09/2020 08:30:31 -------------------------------------------------------------------------------- Wound Assessment Details Patient Name: Date of Service: Leonard Dunning A. 06/09/2020 9:00 A M Medical Record Number: [id number] Patient Account Number: [id number] Date of Birth/Sex: Treating RN: 02-05-1961 (59 y.o. Leonard Bentley) Carlene Coria Primary Care Algenis Ballin: [name] Other Clinician: Referring Joban Colledge: Treating Roben Tatsch/Extender: Tomasa Blase, [employer] Melia Weeks in Treatment: 4 Wound Status Wound Number: 1 Primary Dehisced Wound Etiology: Wound Location: Left Amputation Site - Transmetatarsal Wound Open Wounding Event: Surgical Injury Status: Date Acquired: 03/21/2020 Comorbid Anemia, Arrhythmia, Congestive Heart Failure, Hypertension, Weeks Of Treatment:  4 History: Osteoarthritis, Osteomyelitis, Neuropathy, Received Clustered Wound: No Chemotherapy Wound Measurements Length: (cm) 3 Width: (cm) 4.1 Depth: (cm) 0.5 Area: (cm) 9.66 Volume: (cm) 4.83 % Reduction in Area: 73% % Reduction in Volume: 96.1% Tunneling: No Undermining: No Wound Description Classification: Full Thickness With Exposed Support Structures Exudate Amount: Medium Exudate Type: Serosanguineous Exudate Color: red, brown Foul Odor After Cleansing: No Slough/Fibrino Yes Wound Bed Granulation Amount: Large (67-100%) Exposed Structure Granulation Quality: Red Fascia Exposed: No Necrotic Amount: Small (1-33%) Fat Layer (Subcutaneous Tissue) Exposed: Yes Necrotic Quality: Adherent Slough Tendon Exposed: No Muscle Exposed: No Joint Exposed: No Bone  Exposed: No Electronic Signature(s) Signed: 06/11/2020 4:35:51 PM By: Carlene Coria RN Entered By: Carlene Coria on 06/09/2020 08:29:16 -------------------------------------------------------------------------------- [place] Details Patient Name: Date of Service: Leonard Dunning A. 06/09/2020 9:00 A M Medical Record Number: [id number] Patient Account Number: [id number] Date of Birth/Sex: Treating RN: May 20, 1961 (59 y.o. Leonard Bentley) Carlene Coria Primary Care Aqua Denslow: [name] Other Clinician: Referring Castor Gittleman: Treating Tonimarie Gritz/Extender: [name] in Treatment: 4 Vital Signs Time Taken: 07:59 Temperature (F): 98 Height (in): 69 Pulse (bpm): 57 Weight (lbs): 190 Respiratory Rate (breaths/min): 18 Body Mass Index (BMI): 28.1 Blood Pressure (mmHg): 107/77 Reference Range: 80 - 120 mg / dl Electronic Signature(s) Signed: 06/11/2020 4:35:51 PM By: Carlene Coria RN Entered By: Carlene Coria on 06/09/2020 07:59:27

## 2020-06-12 ENCOUNTER — Ambulatory Visit: Payer: Self-pay | Admitting: Pharmacist

## 2020-06-12 NOTE — Progress Notes (Signed)
Pharmacy Anticoagulation Clinic  Subjective: Patient presents today for INR monitoring. Anticoagulation indication is recurrent PE and s/p mitral valve repair.  At most recent visit on 06/05/20, INR was supratherapeutic, warfarin weekly dose was decreased.  Current dose of warfarin: 2.5 mg MF, 5 mg ROW  Adherence to warfarin: takes as directed Signs/symptoms of bleeding: none Recent changes in diet: none Recent changes in medications: none Upcoming procedures that may impact anticoagulation: none Alcohol intake: continues to limit  Objective: Today's INR = 3.4  Assessment and Plan: Anticoagulation:  - Patient is supratherapeutic based on patient's INR of 3.4 and patient's INR goal of 2-3.  - Will decrease warfarin dose: 2.5 mg MWF and 5 mg ROW  Patient verbalized understanding and was provided with written instructions. Next INR check planned for 1 week.   Benard Halsted, PharmD, [place] [phone]

## 2020-06-13 ENCOUNTER — Other Ambulatory Visit: Payer: Self-pay

## 2020-06-13 ENCOUNTER — Encounter (HOSPITAL_BASED_OUTPATIENT_CLINIC_OR_DEPARTMENT_OTHER): Payer: Medicaid Other | Admitting: Physician Assistant

## 2020-06-13 ENCOUNTER — Ambulatory Visit: Payer: Self-pay | Attending: Nurse Practitioner | Admitting: Pharmacist

## 2020-06-13 DIAGNOSIS — S91302A Unspecified open wound, left foot, initial encounter: Secondary | ICD-10-CM | POA: Diagnosis not present

## 2020-06-13 DIAGNOSIS — Z9889 Other specified postprocedural states: Secondary | ICD-10-CM

## 2020-06-13 DIAGNOSIS — Z86711 Personal history of pulmonary embolism: Secondary | ICD-10-CM

## 2020-06-13 LAB — POCT INR: INR: 3.4 — AB (ref 2.0–3.0)

## 2020-06-13 NOTE — Progress Notes (Addendum)
ARHAAN, CHESNUT ([id number]) Visit Report for 06/13/2020 Chief Complaint Document Details Patient Name: Date of Service: Leonard Bentley 06/13/2020 3:00 PM Medical Record Number: [id number] Patient Account Number: [id number] Date of Birth/Sex: Treating RN: 1961-06-07 (59 y.o. Leonard Bentley Primary Care Provider: [name] Other Clinician: Referring Provider: Treating Provider/Extender: [name], Army Melia Weeks in Treatment: 5 Information Obtained from: Patient Chief Complaint Left foot surgical ulcer Electronic Signature(s) Signed: 06/13/2020 3:14:38 PM By: Worthy Keeler PA-C Entered By: Worthy Keeler on 06/13/2020 15:14:37 -------------------------------------------------------------------------------- HPI Details Patient Name: Date of Service: Leonard Briant Cedar A. 06/13/2020 3:00 PM Medical Record Number: [id number] Patient Account Number: [id number] Date of Birth/Sex: Treating RN: January 08, 1961 (59 y.o. Leonard Bentley Primary Care Provider: [name] Other Clinician: Referring Provider: Treating Provider/Extender: Lenox Ponds Weeks in Treatment: 5 History of Present Illness HPI Description: 05/07/2020 upon evaluation today patient appears to be doing somewhat poorly in regard to a surgery site on his left foot which is a transmetatarsal amputation site. He actually had his second and third toes removed in March 2021. Unfortunately that did not heal appropriately and so he subsequently in May he had a transmetatarsal amputation performed by Dr. [name]. Subsequently this unfortunately also dehisced and though he still has sutures in place this has been about 6 weeks out and unfortunately the sutures are not holding anything together there is necrotic tissue that the sutures are actually embedded into and overall this really needs to be cleaned out and to be honest I think the patient really needs a wound VAC. With that being said there does not  fortunately appear to be any signs of infection which is good news. Nonetheless I do believe this is definitely going to be a significant time to healing even with the wound VAC in order to get this completely closed. There is a lot of depth to the wound through the necrotic tissue and again a lot of the necrotic tissue needs to be removed we may be able to do some of that today I am not thinking we will get everything cleaned out however. No fevers, chills, nausea, vomiting, or diarrhea. The patient does have a history of nicotine dependence and we did discuss that in detail today but other than that he really has no major medical problems at this point other than what is going on with his feet. In fact it really has not even been a determination as to why he is developing the wounds and having trouble healing his blood flow is good he does however smoke which could be affecting things in my opinion. 05/14/20 upon evaluation today patient appears to be doing better in my opinion. Overall the appearance of the wound is improved and I am very pleased with that. He has a tremendous amount of the necrotic tissue that between debridement last week, the use of Anasept in the interim, and debridement today has cleared away and again though he does have some depth this appears to be looking much more healthy. Overall I think a wound VAC would still do excellent for him even more so at this point. 7/16; patient I am seeing for the first time today. Considerable improvement in the surgical wound of the left TMA site. 4 days on a wound VAC. The majority of this is healthy granulated tissue. Some surface slough. Looking back through the pictures there is really been a tremendous improvement since his arrival in the clinic.  He does not have a macrovascular issue. 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 . Silver collagen under the VAC. He is making nice  progress 06/06/2020 on evaluation today patient actually appears to be doing quite well with regard to his wound. He is making good progress and very pleased in that regard. There is no signs of active infection at this time which is also good news. He seems to be tolerating the wound VAC without any complication and I am seeing excellent improvement especially compared to where things started when I first saw him here in the clinic. He is very pleased with where things stand. 06/13/2020 on evaluation today patient's wound actually showed signs of excellent improvement. I am actually very pleased with where things stand and he is progressing quite nicely. Electronic Signature(s) Signed: 06/13/2020 4:29:45 PM By: Worthy Keeler PA-C Entered By: Worthy Keeler on 06/13/2020 16:29:44 -------------------------------------------------------------------------------- Physical Exam Details Patient Name: Date of Service: Leonard Dunning A. 06/13/2020 3:00 PM Medical Record Number: [id number] Patient Account Number: [id number] Date of Birth/Sex: Treating RN: 13-Sep-1961 (59 y.o. Leonard Bentley Primary Care Provider: [name] Other Clinician: Referring Provider: Treating Provider/Extender: [name], [name] in Treatment: 5 Constitutional Well-nourished and well-hydrated in no acute distress. Respiratory normal breathing without difficulty. Psychiatric this patient is able to make decisions and demonstrates good insight into disease process. Alert and Oriented x 3. pleasant and cooperative. Notes Patient's wound bed showed signs of excellent granulation there is no need for sharp debridement today the collagen seems to be helping and wound VAC is really helping to fill this in quite nicely. Overall I am extremely pleased with where he stands at this point. Electronic Signature(s) Signed: 06/13/2020 4:29:58 PM By: Worthy Keeler PA-C Entered By: Worthy Keeler on 06/13/2020  16:29:58 -------------------------------------------------------------------------------- Physician Orders Details Patient Name: Date of Service: Leonard Dunning A. 06/13/2020 3:00 PM Medical Record Number: [id number] Patient Account Number: [id number] Date of Birth/Sex: Treating RN: Apr 13, 1961 (59 y.o. Leonard Bentley Primary Care Provider: [name] Other Clinician: Referring Provider: Treating Provider/Extender: Lenox Ponds Weeks in Treatment: 5 Verbal / Phone Orders: No Diagnosis Coding ICD-10 Coding Code Description S91.302A Unspecified open wound, left foot, initial encounter T81.31XA Disruption of external operation (surgical) wound, not elsewhere classified, initial encounter F17.218 Nicotine dependence, cigarettes, with other nicotine-induced disorders Follow-up Appointments ppointment in 1 week. - MD Return A Nurse Visit: - Tuesday Dressing Change Frequency Wound #1 Left Amputation Site - Transmetatarsal Other: - twice a week Wound Cleansing Wound #1 Left Amputation Site - Transmetatarsal Clean wound with Wound Cleanser Primary Wound Dressing Wound #1 Left Amputation Site - Transmetatarsal Silver Collagen - to base Other: - wound vac Secondary Dressing Wound #1 Left Amputation Site - Transmetatarsal ABD pad Other: - ace wrap Negative Presssure Wound Therapy Wound Vac to wound continuously at 165mm/hg pressure Black Foam Edema Control Avoid standing for long periods of time Elevate legs to the level of the heart or above for 30 minutes daily and/or when sitting, a frequency of: Off-Loading Other: - minimal weight bearing left foot Electronic Signature(s) Signed: 06/13/2020 4:58:13 PM By: Kela Millin Signed: 06/16/2020 2:22:24 PM By: Worthy Keeler PA-C Entered By: Kela Millin on 06/13/2020 16:05:29 -------------------------------------------------------------------------------- Problem List Details Patient Name: Date of  Service: [place]. 06/13/2020 3:00 PM Medical Record Number: [id number] Patient Account Number: [id number] Date of Birth/Sex:  Treating RN: 1961/05/16 (59 y.o. Leonard Bentley Primary Care Provider: [name] Other Clinician: Referring Provider: Treating Provider/Extender: Lenox Ponds Weeks in Treatment: 5 Active Problems ICD-10 Encounter Code Description Active Date MDM Diagnosis S91.302A Unspecified open wound, left foot, initial encounter 05/07/2020 No Yes T81.31XA Disruption of external operation (surgical) wound, not elsewhere classified, 05/07/2020 No Yes initial encounter F17.218 Nicotine dependence, cigarettes, with other nicotine-induced disorders 05/07/2020 No Yes Inactive Problems Resolved Problems Electronic Signature(s) Signed: 06/13/2020 4:58:13 PM By: Kela Millin Signed: 06/16/2020 2:22:24 PM By: Worthy Keeler PA-C Previous Signature: 06/13/2020 3:14:33 PM Version By: Worthy Keeler PA-C Entered By: Kela Millin on 06/13/2020 16:04:53 -------------------------------------------------------------------------------- Progress Note Details Patient Name: Date of Service: Leonard Dunning A. 06/13/2020 3:00 PM Medical Record Number: [id number] Patient Account Number: [id number] Date of Birth/Sex: Treating RN: 1961/05/30 (59 y.o. Leonard Bentley Primary Care Provider: [name] Other Clinician: Referring Provider: Treating Provider/Extender: Lenox Ponds Weeks in Treatment: 5 Subjective Chief Complaint Information obtained from Patient Left foot surgical ulcer History of Present Illness (HPI) 05/07/2020 upon evaluation today patient appears to be doing somewhat poorly in regard to a surgery site on his left foot which is a transmetatarsal amputation site. He actually had his second and third toes removed in March 2021. Unfortunately that did not heal appropriately and so he subsequently in May he had  a transmetatarsal amputation performed by Dr. [name]. Subsequently this unfortunately also dehisced and though he still has sutures in place this has been about 6 weeks out and unfortunately the sutures are not holding anything together there is necrotic tissue that the sutures are actually embedded into and overall this really needs to be cleaned out and to be honest I think the patient really needs a wound VAC. With that being said there does not fortunately appear to be any signs of infection which is good news. Nonetheless I do believe this is definitely going to be a significant time to healing even with the wound VAC in order to get this completely closed. There is a lot of depth to the wound through the necrotic tissue and again a lot of the necrotic tissue needs to be removed we may be able to do some of that today I am not thinking we will get everything cleaned out however. No fevers, chills, nausea, vomiting, or diarrhea. The patient does have a history of nicotine dependence and we did discuss that in detail today but other than that he really has no major medical problems at this point other than what is going on with his feet. In fact it really has not even been a determination as to why he is developing the wounds and having trouble healing his blood flow is good he does however smoke which could be affecting things in my opinion. 05/14/20 upon evaluation today patient appears to be doing better in my opinion. Overall the appearance of the wound is improved and I am very pleased with that. He has a tremendous amount of the necrotic tissue that between debridement last week, the use of Anasept in the interim, and debridement today has cleared away and again though he does have some depth this appears to be looking much more healthy. Overall I think a wound VAC would still do excellent for him even more so at this point. 7/16; patient I am seeing for the first time today. Considerable  improvement in the surgical wound of the left TMA site. 4  days on a wound VAC. The majority of this is healthy granulated tissue. Some surface slough. Looking back through the pictures there is really been a tremendous improvement since his arrival in the clinic. He does not have a macrovascular issue. 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 . Silver collagen under the VAC. He is making nice progress 06/06/2020 on evaluation today patient actually appears to be doing quite well with regard to his wound. He is making good progress and very pleased in that regard. There is no signs of active infection at this time which is also good news. He seems to be tolerating the wound VAC without any complication and I am seeing excellent improvement especially compared to where things started when I first saw him here in the clinic. He is very pleased with where things stand. 06/13/2020 on evaluation today patient's wound actually showed signs of excellent improvement. I am actually very pleased with where things stand and he is progressing quite nicely. Objective Constitutional Well-nourished and well-hydrated in no acute distress. Vitals Time Taken: 3:44 PM, Height: 69 in, Weight: 190 lbs, BMI: 28.1, Temperature: 98.2 F, Pulse: 86 bpm, Respiratory Rate: 18 breaths/min, Blood Pressure: 110/82 mmHg. Respiratory normal breathing without difficulty. Psychiatric this patient is able to make decisions and demonstrates good insight into disease process. Alert and Oriented x 3. pleasant and cooperative. General Notes: Patient's wound bed showed signs of excellent granulation there is no need for sharp debridement today the collagen seems to be helping and wound VAC is really helping to fill this in quite nicely. Overall I am extremely pleased with where he stands at this point. Integumentary (Hair, Skin) Wound #1 status is Open. Original cause of wound was Surgical  Injury. The wound is located on the Left Amputation Site - Transmetatarsal. The wound measures 2.1cm length x 4.2cm width x 0.4cm depth; 6.927cm^2 area and 2.771cm^3 volume. There is Fat Layer (Subcutaneous Tissue) Exposed exposed. There is no tunneling or undermining noted. There is a medium amount of serosanguineous drainage noted. There is large (67-100%) red granulation within the wound bed. There is a small (1-33%) amount of necrotic tissue within the wound bed including Adherent Slough. Assessment Active Problems ICD-10 Unspecified open wound, left foot, initial encounter Disruption of external operation (surgical) wound, not elsewhere classified, initial encounter Nicotine dependence, cigarettes, with other nicotine-induced disorders Plan Follow-up Appointments: Return Appointment in 1 week. - MD Nurse Visit: - Tuesday Dressing Change Frequency: Wound #1 Left Amputation Site - Transmetatarsal: Other: - twice a week Wound Cleansing: Wound #1 Left Amputation Site - Transmetatarsal: Clean wound with Wound Cleanser Primary Wound Dressing: Wound #1 Left Amputation Site - Transmetatarsal: Silver Collagen - to base Other: - wound vac Secondary Dressing: Wound #1 Left Amputation Site - Transmetatarsal: ABD pad Other: - ace wrap Negative Presssure Wound Therapy: Wound Vac to wound continuously at 157mm/hg pressure Black Foam Edema Control: Avoid standing for long periods of time Elevate legs to the level of the heart or above for 30 minutes daily and/or when sitting, a frequency of: Off-Loading: Other: - minimal weight bearing left foot 1. I would recommend currently that we continue with the wound VAC I think that is still the optimal thing to do. 2. We will continue with collagen underneath as that also seems to be beneficial. 3. I would also recommend that he avoid long periods of time standing he is being very careful with the foot in that regard.  We will see patient back for  reevaluation in 1 week here in the clinic. If anything worsens or changes patient will contact our office for additional recommendations. Electronic Signature(s) Signed: 06/13/2020 4:30:34 PM By: Worthy Keeler PA-C Entered By: Worthy Keeler on 06/13/2020 16:30:34 -------------------------------------------------------------------------------- SuperBill Details Patient Name: Date of Service: Leonard Dunning A. 06/13/2020 Medical Record Number: [id number] Patient Account Number: [id number] Date of Birth/Sex: Treating RN: [birth year] (59 y.o. Leonard Bentley Primary Care Provider: [name] Other Clinician: Referring Provider: Treating Provider/Extender: [name], Army Melia Weeks in Treatment: 5 Diagnosis Coding ICD-10 Codes Code Description [phone] Unspecified open wound, left foot, initial encounter T81.31XA Disruption of external operation (surgical) wound, not elsewhere classified, initial encounter F17.218 Nicotine dependence, cigarettes, with other nicotine-induced disorders Facility Procedures CPT4 Code: [id number] Description: 03833 - WOUND VAC-50 SQ CM OR LESS Modifier: Quantity: 1 Physician Procedures : CPT4 Code Description Modifier [id number] - WC PHYS LEVEL 4 - EST PT ICD-10 Diagnosis Description T66.060O Unspecified open wound, left foot, initial encounter T81.31XA Disruption of external operation (surgical) wound, not elsewhere classified,  initial encounter F17.218 Nicotine dependence, cigarettes, with other nicotine-induced disorders Quantity: 1 Electronic Signature(s) Signed: 06/13/2020 4:30:52 PM By: Worthy Keeler PA-C Entered By: Worthy Keeler on 06/13/2020 16:30:52

## 2020-06-13 NOTE — Progress Notes (Signed)
Leonard Bentley, Leonard Bentley ([id number]) Visit Report for 06/13/2020 Arrival Information Details Patient Name: Date of Service: Leonard Bentley 06/13/2020 3:00 PM Medical Record Number: [id number] Patient Account Number: [id number] Date of Birth/Sex: Treating RN: 08-04-1961 (59 y.o. Jerilynn Mages) Carlene Coria Primary Care [name]: [name] Other Clinician: Referring [name]: Treating Artavia [name]/Extender: Lenox Ponds Weeks in Treatment: 5 Visit Information History Since Last Visit All ordered tests and consults were completed: No Patient Arrived: [name] Added or deleted any medications: No Arrival Time: 15:44 Any new allergies or adverse reactions: No Accompanied By: self Had a fall or experienced change in No Transfer Assistance: None activities of daily living that may affect Patient Identification Verified: Yes risk of falls: Secondary Verification Process Completed: Yes Signs or symptoms of abuse/neglect since last visito No Patient Requires Transmission-Based Precautions: No Hospitalized since last visit: No Patient Has Alerts: Yes Implantable device outside of the clinic excluding No Patient Alerts: Patient on Blood Thinner cellular tissue based products placed in the center L ABI: 1.03 (01/2020) since last visit: Has Dressing in Place as Prescribed: Yes Pain Present Now: No Electronic Signature(s) Signed: 06/13/2020 4:42:20 PM By: Carlene Coria RN Entered By: Carlene Coria on 06/13/2020 15:44:48 -------------------------------------------------------------------------------- Encounter Discharge Information Details Patient Name: Date of Service: Leonard Dunning A. 06/13/2020 3:00 PM Medical Record Number: [id number] Patient Account Number: [id number] Date of Birth/Sex: Treating RN: 14-Oct-1961 (59 y.o. [name] Primary Care [name]: [name] Other Clinician: Referring [name]: Treating [name]/Extender: [name], Army Melia Weeks in Treatment:  5 Encounter Discharge Information Items Discharge Condition: Stable Ambulatory Status: Walker Discharge Destination: Home Transportation: Other Accompanied By: self Schedule Follow-up Appointment: Yes Clinical Summary of Care: Patient Declined Notes SCAT Electronic Signature(s) Signed: 06/13/2020 5:16:39 PM By: [name] RN, BSN Entered By: [name] on 06/13/2020 16:47:30 -------------------------------------------------------------------------------- Lower Extremity Assessment Details Patient Name: Date of Service: Leonard Dunning A. 06/13/2020 3:00 PM Medical Record Number: [id number] Patient Account Number: [id number] Date of Birth/Sex: Treating RN: [birth year] (59 y.o. Oval Linsey Primary Care Keishana Klinger: [name] Other Clinician: Referring Jalik Gellatly: Treating Shoshanna Mcquitty/Extender: [name], [name] in Treatment: 5 Edema Assessment Assessed: [Left: No] [Right: No] Edema: [Left: Ye] [Right: s] Calf Left: Right: Point of Measurement: 31 cm From Medial Instep 31 cm cm Ankle Left: Right: Point of Measurement: 11 cm From Medial Instep 27 cm cm Electronic Signature(s) Signed: 06/13/2020 4:42:20 PM By: Carlene Coria RN Entered By: Carlene Coria on 06/13/2020 15:45:17 -------------------------------------------------------------------------------- [place] Details Patient Name: Date of Service: Leonard Dunning A. 06/13/2020 3:00 PM Medical Record Number: [id number] Patient Account Number: [id number] Date of Birth/Sex: Treating RN: 09-Jul-1961 (59 y.o. [name] Primary Care Dave Mannes: [name] Other Clinician: Referring Karrin Eisenmenger: Treating [name]/Extender: [name], Army Melia Weeks in Treatment: 5 Active Inactive Abuse / Safety / Falls / Self Care Management Nursing Diagnoses: Potential for falls Goals: Patient/caregiver will verbalize/demonstrate measures taken to prevent injury and/or falls Date  Initiated: 05/07/2020 Target Resolution Date: 07/04/2020 Goal Status: Active Interventions: Assess fall risk on admission and as needed Assess impairment of mobility on admission and as needed per policy Notes: Necrotic Tissue Nursing Diagnoses: Impaired tissue integrity related to necrotic/devitalized tissue Knowledge deficit related to management of necrotic/devitalized tissue Goals: Necrotic/devitalized tissue will be minimized in the wound bed Date Initiated: 05/07/2020 Target Resolution Date: 07/04/2020 Goal Status: Active Patient/caregiver will verbalize understanding of reason and process for debridement of necrotic tissue Date Initiated: 05/07/2020  Target Resolution Date: 07/04/2020 Goal Status: Active Interventions: Assess patient pain level pre-, during and post procedure and prior to discharge Provide education on necrotic tissue and debridement process Treatment Activities: Apply topical anesthetic as ordered : 05/07/2020 Biologic debridement : 05/07/2020 Excisional debridement : 05/07/2020 Notes: Wound/Skin Impairment Nursing Diagnoses: Impaired tissue integrity Knowledge deficit related to smoking impact on wound healing Knowledge deficit related to ulceration/compromised skin integrity Goals: Patient will demonstrate a reduced rate of smoking or cessation of smoking Date Initiated: 05/07/2020 Target Resolution Date: 06/04/2020 Goal Status: Active Patient/caregiver will verbalize understanding of skin care regimen Date Initiated: 05/07/2020 Target Resolution Date: 06/04/2020 Goal Status: Active Ulcer/skin breakdown will have a volume reduction of 30% by week 4 Date Initiated: 05/07/2020 Target Resolution Date: 06/04/2020 Goal Status: Active Interventions: Assess patient/caregiver ability to obtain necessary supplies Assess patient/caregiver ability to perform ulcer/skin care regimen upon admission and as needed Assess ulceration(s) every visit Provide education on  ulcer and skin care Treatment Activities: Skin care regimen initiated : 05/07/2020 Topical wound management initiated : 05/07/2020 Notes: Electronic Signature(s) Signed: 06/13/2020 4:58:13 PM By: Kela Millin Entered By: Kela Millin on 06/13/2020 16:05:36 -------------------------------------------------------------------------------- Negative Pressure Wound Therapy Maintenance (NPWT) Details Patient Name: Date of Service: Leonard Bentley 06/13/2020 3:00 PM Medical Record Number: [id number] Patient Account Number: [id number] Date of Birth/Sex: Treating RN: April 08, 1961 (59 y.o. [name] Primary Care Lourdes Manning: [name] Other Clinician: Referring Kyara Boxer: Treating Malva Diesing/Extender: [name], [name] in Treatment: 5 NPWT Maintenance Performed for: Wound #1 Left Amputation Site - Transmetatarsal Performed By: Kela Millin, RN Type: VAC System Coverage Size (sq cm): 8.82 Pressure Type: Constant Pressure Setting: 125 mmHG Drain Type: Sponge/Dressing Type: Foam, Black Date Initiated: 05/20/2020 Dressing Removed: Yes Quantity of Sponges/Gauze Removed: 1 Canister Changed: No Dressing Reapplied: Yes Quantity of Sponges/Gauze Inserted: 1 Days On NPWT : 25 Post Procedure Diagnosis Same as Pre-procedure Electronic Signature(s) Signed: 06/13/2020 4:58:13 PM By: Kela Millin Entered By: Kela Millin on 06/13/2020 16:24:03 -------------------------------------------------------------------------------- Pain Assessment Details Patient Name: Date of Service: Leonard Dunning A. 06/13/2020 3:00 PM Medical Record Number: [id number] Patient Account Number: [id number] Date of Birth/Sex: Treating RN: 21-Feb-1961 (59 y.o. Oval Linsey Primary Care Myson Levi: [name] Other Clinician: Referring Samarra Ridgely: Treating Amely Voorheis/Extender: [name], [name] in Treatment: 5 Active Problems Location of Pain Severity and  Description of Pain Patient Has Paino No Site Locations Pain Management and Medication Current Pain Management: Electronic Signature(s) Signed: 06/13/2020 4:42:20 PM By: Carlene Coria RN Entered By: Carlene Coria on 06/13/2020 15:45:12 -------------------------------------------------------------------------------- Patient/Caregiver Education Details Patient Name: Date of Service: Leonard Bentley 8/6/2021andnbsp3:00 PM Medical Record Number: [id number] Patient Account Number: [id number] Date of Birth/Gender: Treating RN: [birth year] (59 y.o. [name] Primary Care Physician: [name] Other Clinician: Referring Physician: Treating Physician/Extender: Noni Saupe in Treatment: 5 Education Assessment Education Provided To: Patient Education Topics Provided Wound/Skin Impairment: Handouts: Caring for Your Ulcer Methods: Explain/Verbal Responses: State content correctly Electronic Signature(s) Signed: 06/13/2020 4:58:13 PM By: Kela Millin Entered By: Kela Millin on 06/13/2020 16:05:56 -------------------------------------------------------------------------------- Wound Assessment Details Patient Name: Date of Service: Leonard Dunning A. 06/13/2020 3:00 PM Medical Record Number: [id number] Patient Account Number: [id number] Date of Birth/Sex: Treating RN: [birth year] (59 y.o. Oval Linsey Primary Care Akhilesh Sassone: [name] Other Clinician: Referring Naylea Wigington: Treating Dejanay Wamboldt/Extender: [name], [name] in Treatment: 5 Wound Status Wound Number: 1 Primary Dehisced Wound Etiology: Wound Location:  Left Amputation Site - Transmetatarsal Wound Open Wounding Event: Surgical Injury Status: Date Acquired: 03/21/2020 Comorbid Anemia, Arrhythmia, Congestive Heart Failure, Hypertension, Weeks Of Treatment: 5 History: Osteoarthritis, Osteomyelitis, Neuropathy, Received Clustered Wound: No Chemotherapy Wound  Measurements Length: (cm) 2.1 Width: (cm) 4.2 Depth: (cm) 0.4 Area: (cm) 6.927 Volume: (cm) 2.771 % Reduction in Area: 80.6% % Reduction in Volume: 97.8% Epithelialization: None Tunneling: No Undermining: No Wound Description Classification: Full Thickness With Exposed Support Structures Exudate Amount: Medium Exudate Type: Serosanguineous Exudate Color: red, brown Foul Odor After Cleansing: No Slough/Fibrino Yes Wound Bed Granulation Amount: Large (67-100%) Exposed Structure Granulation Quality: Red Fascia Exposed: No Necrotic Amount: Small (1-33%) Fat Layer (Subcutaneous Tissue) Exposed: Yes Necrotic Quality: Adherent Slough Tendon Exposed: No Muscle Exposed: No Joint Exposed: No Bone Exposed: No Treatment Notes Wound #1 (Left Amputation Site - Transmetatarsal) 2. Periwound Care Skin Prep 3. Primary Dressing Applied [employer] Other primary dressing (specifiy in notes) 4. Secondary Dressing Roll Gauze 5. Secured With Elastic bandage Notes KCI wound vac , 1 piece black foam inserted, pressure set at 125 mmHg with good seal Electronic Signature(s) Signed: 06/13/2020 4:42:20 PM By: Carlene Coria RN Entered By: Carlene Coria on 06/13/2020 15:45:37 -------------------------------------------------------------------------------- Vitals Details Patient Name: Date of Service: Leonard Dunning A. 06/13/2020 3:00 PM Medical Record Number: [id number] Patient Account Number: [id number] Date of Birth/Sex: Treating RN: 10-May-1961 (59 y.o. Oval Linsey Primary Care Kortny Lirette: [name] Other Clinician: Referring Rowe Warman: Treating Adrinne Sze/Extender: [name], [name] in Treatment: 5 Vital Signs Time Taken: 15:44 Temperature (F): 98.2 Height (in): 69 Pulse (bpm): 86 Weight (lbs): 190 Respiratory Rate (breaths/min): 18 Body Mass Index (BMI): 28.1 Blood Pressure (mmHg): 110/82 Reference Range: 80 - 120 mg / dl Electronic Signature(s) Signed:  06/13/2020 4:42:20 PM By: Carlene Coria RN Entered By: Carlene Coria on 06/13/2020 15:45:06

## 2020-06-16 ENCOUNTER — Other Ambulatory Visit: Payer: Self-pay | Admitting: Nurse Practitioner

## 2020-06-16 ENCOUNTER — Telehealth: Payer: Self-pay | Admitting: Podiatry

## 2020-06-16 ENCOUNTER — Other Ambulatory Visit: Payer: Self-pay

## 2020-06-16 ENCOUNTER — Encounter: Payer: Self-pay | Admitting: Nurse Practitioner

## 2020-06-16 ENCOUNTER — Ambulatory Visit: Payer: Self-pay | Attending: Nurse Practitioner | Admitting: Nurse Practitioner

## 2020-06-16 VITALS — BP 128/88 | HR 93 | Temp 97.7°F | Ht 69.0 in | Wt 178.0 lb

## 2020-06-16 DIAGNOSIS — E781 Pure hyperglyceridemia: Secondary | ICD-10-CM

## 2020-06-16 DIAGNOSIS — R200 Anesthesia of skin: Secondary | ICD-10-CM

## 2020-06-16 DIAGNOSIS — N3001 Acute cystitis with hematuria: Secondary | ICD-10-CM

## 2020-06-16 DIAGNOSIS — R7303 Prediabetes: Secondary | ICD-10-CM

## 2020-06-16 DIAGNOSIS — I10 Essential (primary) hypertension: Secondary | ICD-10-CM

## 2020-06-16 LAB — POCT URINALYSIS DIP (CLINITEK)
Bilirubin, UA: NEGATIVE
Glucose, UA: NEGATIVE mg/dL
Ketones, POC UA: NEGATIVE mg/dL
Nitrite, UA: POSITIVE — AB
POC PROTEIN,UA: 100 — AB
Spec Grav, UA: 1.025 (ref 1.010–1.025)
Urobilinogen, UA: 0.2 E.U./dL
pH, UA: 5.5 (ref 5.0–8.0)

## 2020-06-16 LAB — GLUCOSE, POCT (MANUAL RESULT ENTRY): POC Glucose: 101 mg/dl — AB (ref 70–99)

## 2020-06-16 MED ORDER — ATORVASTATIN CALCIUM 20 MG PO TABS: 20.0000 mg | ORAL_TABLET | Freq: Every day | ORAL | 3 refills | Status: AC

## 2020-06-16 MED ORDER — NITROFURANTOIN MONOHYD MACRO 100 MG PO CAPS: 100.0000 mg | ORAL_CAPSULE | Freq: Two times a day (BID) | ORAL | 0 refills | Status: AC

## 2020-06-16 MED ORDER — GABAPENTIN 100 MG PO CAPS: 200.0000 mg | ORAL_CAPSULE | Freq: Three times a day (TID) | ORAL | 2 refills | Status: DC

## 2020-06-16 MED ORDER — METOPROLOL SUCCINATE ER 25 MG PO TB24: 12.5000 mg | ORAL_TABLET | Freq: Every day | ORAL | 1 refills | Status: DC

## 2020-06-16 NOTE — Telephone Encounter (Signed)
Medication Refill - Medication: gabapentin (NEURONTIN) 100 MG capsule(Expired) oxyCODONE (OXY IR/ROXICODONE) 5 MG immediate release tablet     Preferred Pharmacy (with phone number or street name):  [place] ([place]), [place] - [address] Phone:  [phone]  Fax:  [phone]       Agent: Please be advised that RX refills may take up to 3 business days. We ask that you follow-up with your pharmacy.

## 2020-06-16 NOTE — Progress Notes (Signed)
+  Assessment & Plan:  Leonard Bentley was seen today for medication refill.  Diagnoses and all orders for this visit:  Essential hypertension -     metoprolol succinate (TOPROL-XL) 25 MG 24 hr tablet; Take 0.5 tablets (12.5 mg total) by mouth daily. Continue all antihypertensives as prescribed.  Remember to bring in your blood pressure log with you for your follow up appointment.  DASH/Mediterranean Diets are healthier choices for HTN.   Acute cystitis with hematuria -     POCT URINALYSIS DIP (CLINITEK) -     PSA -     nitrofurantoin, macrocrystal-monohydrate, (MACROBID) 100 MG capsule; Take 1 capsule (100 mg total) by mouth 2 (two) times daily for 7 days. -     Urine Culture  Hyperglyceridemia -     atorvastatin (LIPITOR) 20 MG tablet; Take 1 tablet (20 mg total) by mouth daily. INSTRUCTIONS: Work on a low fat, heart healthy diet and participate in regular aerobic exercise program by working out at least 150 minutes per week; 5 days a week-30 minutes per [place]. Avoid red meat/beef/steak,  fried foods. junk foods, sodas, sugary drinks, unhealthy snacking, alcohol and smoking.  Drink at least 80 oz of water per [place] and monitor your carbohydrate intake daily.   Prediabetes -     Glucose (CBG) Lab Results  Component Value Date   HGBA1C 5.8 (H) 02/05/2020    Left sided numbness -     gabapentin (NEURONTIN) 100 MG capsule; Take 2 capsules (200 mg total) by mouth 3 (three) times daily.    Patient has been counseled on age-appropriate routine health concerns for screening and prevention. These are reviewed and up-to-date. Referrals have been placed accordingly. Immunizations are up-to-date or declined.    Subjective:   Chief Complaint  Patient presents with  . Medication Refill    Pt. is requesting Gabapentin refill.    HPI Leonard Bentley 59 y.o. male presents to office today for follow up.  PMH:  Acute pulmonary embolism ([place]) (04/11/2016), Anxiety, Arthritis, Atrial tachycardia ([place])  (01/06/2016), Depression, ETOH abuse, Hypertension, Lymphoma, non Hodgkin's (09/25/2011), Mitral regurgitation (01/20/2016), Mobitz I (10/09/2015), Murmur (01/06/2016), Occasional tremors, PAC (premature atrial contraction) (10/09/2015), and S/P minimally-invasive mitral valve repair (10/27/2016).   He is currently being followed by Dr. 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   UTI symptoms Notes darker urine and tingling in his penis after he urinates. He is currently not sexually active  Essential Hypertension Well controlled with metoprolol XL 12.5 mg daily. Denies chest pain, shortness of breath, palpitations, lightheadedness, dizziness, headaches or BLE edema.  BP Readings from Last 3 Encounters:  06/16/20 128/88  04/04/20 96/74  02/28/20 125/82  .    Mixed Hyperlipidemia Taking lipitor 20 mg daily as prescribed. Denies myalgias or statin intolerance.  Lab Results  Component Value Date   CHOL 164 02/05/2020   CHOL 222 (H) 08/03/2019   CHOL 239 (H) 04/03/2016   Lab Results  Component Value Date   HDL 50 02/05/2020   HDL 99 08/03/2019   HDL 126 04/03/2016   Lab Results  Component Value Date   LDLCALC 100 (H) 02/05/2020   LDLCALC 111 (H) 08/03/2019   LDLCALC 100 (H) 04/03/2016   Lab Results  Component Value Date   TRIG 71 02/05/2020   TRIG 68 08/03/2019   TRIG 63 04/03/2016   Lab Results  Component Value Date   CHOLHDL 3.3  02/05/2020   CHOLHDL 2.2 08/03/2019   CHOLHDL 1.9 04/03/2016   No results found for: LDLDIRECT The 10-year ASCVD risk score Mikey Bussing DC Jr., et al., 2013) is: 20.9%   Values used to calculate the score:     Age: 43 years     Sex: Male     Is Non-Hispanic African American: Yes     Diabetic: No     Tobacco smoker: Yes     Systolic Blood Pressure: 381 mmHg     Is BP treated: Yes     HDL Cholesterol: 50 mg/dL     Total Cholesterol: 164 mg/dL     Review of Systems  Constitutional: Negative for fever, malaise/fatigue and weight loss.  HENT: Negative.  Negative for nosebleeds.   Eyes: Negative.  Negative for blurred vision, double vision and photophobia.  Respiratory: Negative.  Negative for cough and shortness of breath.   Cardiovascular: Negative.  Negative for chest pain, palpitations and leg swelling.  Gastrointestinal: Negative.  Negative for heartburn, nausea and vomiting.  Genitourinary:       SEE HPI  Musculoskeletal: Negative.  Negative for myalgias.  Neurological: Positive for sensory change. Negative for dizziness, focal weakness, seizures and headaches.  Psychiatric/Behavioral: Negative.  Negative for suicidal ideas.    Past Medical History:  Diagnosis Date  . Acute pulmonary embolism ([place]) 04/11/2016  . Anxiety   . Arthritis   . Atrial tachycardia ([place]) 01/06/2016  . Depression   . DTs (delirium tremens) ([place])   . Dyspnea   . ED (erectile dysfunction)   . ETOH abuse   . Hypertension   . Lymphoma, non Hodgkin's 09/25/2011   Stage 2  . Mitral regurgitation 01/20/2016  . Mobitz I 10/09/2015  . Murmur 01/06/2016  . Occasional tremors   . PAC (premature atrial contraction) 10/09/2015  . S/P minimally-invasive mitral valve repair 10/27/2016   Complex valvuloplasty including artificial Gore-tex neochord placement x6 and 34 mm Edwards Physio II ring annuloplasty via right mini thoracotomy approach    Past Surgical History:  Procedure Laterality Date  . AMPUTATION Left 01/12/2019   Procedure: LEFT GREAT TOE AMPUTATION;  Surgeon: [name], MD;  Location: [facility];  Service: Orthopedics;  Laterality: Left;  . AMPUTATION Left 01/16/2020   Procedure: LEFT 1ST METATARSAL AND SECOND TOE AMPUTATION;  Surgeon: [name], MD;  Location: [facility];  Service: Orthopedics;  Laterality: Left;  . AMPUTATION Left 01/30/2020   Procedure: LEFT FOOT 3RD RAY AMPUTATION;  Surgeon: [name], MD;  Location: [facility];  Service:  Orthopedics;  Laterality: Left;  . AMPUTATION Left 03/21/2020   Procedure: MIDFOOT AMPUTATION; LEFT;  Surgeon: [name], MD;  Location: [facility];  Service: Orthopedics;  Laterality: Left;  . CARDIAC CATHETERIZATION N/A 08/13/2016   Procedure: Right/Left Heart Cath and Coronary Angiography;  Surgeon: [name], MD;  Location: [facility];  Service: Cardiovascular;  Laterality: N/A;  . MITRAL VALVE REPAIR Right 10/27/2016   Procedure: MINIMALLY INVASIVE MITRAL VALVE REPAIR (MVR) USING 34 PHYSIO II ANNULOPLASTY RING;  Surgeon: [name], MD;  Location: [facility];  Service: Open Heart Surgery;  Laterality: Right;  . SKIN BIOPSY Right 04/04/2018   right mid medial anterior tibial shave  see report in chart  . TEE WITHOUT CARDIOVERSION N/A 02/11/2016   Procedure: TRANSESOPHAGEAL ECHOCARDIOGRAM (TEE);  Surgeon: [name], MD;  Location: [facility];  Service: Cardiovascular;  Laterality: N/A;  . TEE WITHOUT CARDIOVERSION N/A 10/27/2016   Procedure: TRANSESOPHAGEAL  ECHOCARDIOGRAM (TEE);  Surgeon: [name], MD;  Location: [facility];  Service: Open Heart Surgery;  Laterality: N/A;  . TRIGGER FINGER RELEASE Bilateral     Family History  Problem Relation Age of Onset  . Cancer Mother        BREAST(BONE)  . Cancer Father        PANCREATIC  . Hypertension Maternal Grandmother   . Stroke Maternal Aunt   . Heart attack Neg Hx     Social History Reviewed with no changes to be made today.   Outpatient Medications Prior to Visit  Medication Sig Dispense Refill  . ALPRAZolam (XANAX) 0.25 MG tablet Take 1 tablet (0.25 mg total) by mouth 2 (two) times daily as needed for anxiety. 60 tablet 0  . collagenase (SANTYL) ointment Apply 1 application topically daily. 90 g 0  . oxyCODONE (OXY IR/ROXICODONE) 5 MG immediate release tablet Take 1 tablet (5 mg total) by mouth every 6 (six) hours as needed for moderate pain (pain score 4-6). 15 tablet 0  . warfarin (COUMADIN) 5 MG tablet  TAKE AS DIRECTED BY COUMADIN CLINIC 60 tablet 2  . doxycycline (VIBRA-TABS) 100 MG tablet Take 1 tablet (100 mg total) by mouth 2 (two) times daily. 20 tablet 0  . metoprolol succinate (TOPROL-XL) 25 MG 24 hr tablet Take 0.5 tablets (12.5 mg total) by mouth daily. 45 tablet 0  . nicotine (NICODERM CQ - DOSED IN MG/24 HOURS) 21 mg/24hr patch Place 1 patch (21 mg total) onto the skin daily. (Patient not taking: Reported on 06/16/2020) 28 patch 0  . gabapentin (NEURONTIN) 100 MG capsule Take 2 capsules (200 mg total) by mouth 3 (three) times daily. 180 capsule 0   No facility-administered medications prior to visit.    Allergies  Allergen Reactions  . Other Other (See Comments)    "Cactus" caused blisters on tongue (if prepared as food)       Objective:    BP 128/88 (BP Location: Right Arm, Patient Position: Sitting, Cuff Size: Normal)   Pulse 93   Temp 97.7 F (36.5 C) (Temporal)   Ht 5\' 9"  (1.753 m)   Wt 178 lb (80.7 kg)   SpO2 100%   BMI 26.29 kg/m  Wt Readings from Last 3 Encounters:  06/16/20 178 lb (80.7 kg)  04/28/20 186 lb (84.4 kg)  04/08/20 186 lb (84.4 kg)    Physical Exam Vitals and nursing note reviewed.  Constitutional:      Appearance: He is well-developed.  HENT:     Head: Normocephalic and atraumatic.  Cardiovascular:     Rate and Rhythm: Normal rate. Rhythm irregular.     Heart sounds: Normal heart sounds. No murmur heard.  No friction rub. No gallop.   Pulmonary:     Effort: Pulmonary effort is normal. No tachypnea or respiratory distress.     Breath sounds: Normal breath sounds. No decreased breath sounds, wheezing, rhonchi or rales.  Chest:     Chest wall: No tenderness.  Abdominal:     General: Bowel sounds are normal.     Palpations: Abdomen is soft.  Musculoskeletal:        General: Normal range of motion.     Cervical back: Normal range of motion.  Skin:    General: Skin is warm and dry.  Neurological:     Mental Status: He is alert and  oriented to person, place, and time.     Coordination: Coordination normal.  Psychiatric:  Behavior: Behavior normal. Behavior is cooperative.        Thought Content: Thought content normal.        Judgment: Judgment normal.          Patient has been counseled extensively about nutrition and exercise as well as the importance of adherence with medications and regular follow-up. The patient was given clear instructions to go to ER or return to medical center if symptoms don't improve, worsen or new problems develop. The patient verbalized understanding.   Follow-up: Return in about 3 months (around 09/16/2020).   [name], FNP-BC [facility] [place], [place]   06/19/2020, 12:23 AM

## 2020-06-16 NOTE — Telephone Encounter (Signed)
Requested medication (s) are due for refill today: yes  Requested medication (s) are on the active medication list: gabapentin expired  Last refill:  gabapentin: 04/04/20          Oxycodone: 05/20/20  Future visit scheduled: today  Notes to clinic:  Gabapentin expired 05/04/20 NT not allowed to refuse Oxy- was prescribed by pt's foot doctor   Requested Prescriptions  Pending Prescriptions Disp Refills   gabapentin (NEURONTIN) 100 MG capsule 180 capsule     Sig: Take 2 capsules (200 mg total) by mouth 3 (three) times daily.      Neurology: Anticonvulsants - gabapentin Passed - 06/16/2020  8:49 AM      Passed - Valid encounter within last 12 months    Recent Outpatient Visits           3 months ago Excessive cerumen in both ear canals   [place], [place] J, NP   7 months ago GAD (generalized anxiety disorder)   [place], Vernia Buff, NP   10 months ago [place], Vernia Buff, NP   1 year ago SVT (supraventricular tachycardia) [facility])   [place] [name], NP   1 year ago Personal history of pulmonary embolism   [place], [place], [place]       Future Appointments             Today [name], NP [place]   In 3 days Daisy Blossom, Jarome Matin, [place]              oxyCODONE (OXY IR/ROXICODONE) 5 MG immediate release tablet 15 tablet     Sig: Take 1 tablet (5 mg total) by mouth every 6 (six) hours as needed for moderate pain (pain score 4-6).      Not Delegated - Analgesics:  Opioid Agonists Failed - 06/16/2020  8:49 AM      Failed - This refill cannot be delegated      Failed - Urine Drug Screen completed in last 360 days.      Passed - Valid encounter within last 6 months    Recent Outpatient  Visits           3 months ago Excessive cerumen in both ear canals   [place], [place] J, NP   7 months ago GAD (generalized anxiety disorder)   [place] [name], NP   10 months ago [place], Vernia Buff, NP   1 year ago SVT (supraventricular tachycardia) [facility])   [place] [name], NP   1 year ago Personal history of pulmonary embolism   [place], Dionne Bucy, [place]       Future Appointments             Today [name], NP [place]   In 3 days Daisy Blossom, Jarome Matin, [place]

## 2020-06-16 NOTE — Telephone Encounter (Signed)
Pt called requesting a refill on Oxycodone and Gabapentin. Pt states he would like to have the Oxycodone to use prior to his wound care treatments and the gabapentin was originally prescribed by a doctor at the hospital but the pt would like to know if Dr. Jacqualyn Posey can prescribe it for him.  Please give patient a call.

## 2020-06-17 ENCOUNTER — Encounter (HOSPITAL_BASED_OUTPATIENT_CLINIC_OR_DEPARTMENT_OTHER): Payer: Medicaid Other | Admitting: Internal Medicine

## 2020-06-17 DIAGNOSIS — S91302A Unspecified open wound, left foot, initial encounter: Secondary | ICD-10-CM | POA: Diagnosis not present

## 2020-06-17 LAB — PSA: Prostate Specific Ag, Serum: 3.4 ng/mL (ref 0.0–4.0)

## 2020-06-18 LAB — URINE CULTURE

## 2020-06-18 NOTE — Progress Notes (Deleted)
Pharmacy Anticoagulation Clinic  Subjective: Patient presents today for INR monitoring. Anticoagulation indication is recurrent PE and s/p mitral valve repair.   At most recent visit on 06/13/20, INR supratherapeutic, warfarin weekly dose was decreased.  Current dose of warfarin: 2.5 mg MWF, 5 mg ROW  Adherence to warfarin:  Signs/symptoms of bleeding: Recent changes in diet: Recent changes in medications: Upcoming procedures that may impact anticoagulation:   Objective: Today's INR =   Lab Results  Component Value Date   INR 3.4 (A) 06/13/2020   INR 3.2 (A) 06/05/2020   INR 5.3 (A) 05/29/2020    Assessment and Plan: Anticoagulation:  - Patient is ***therapeutic based on patient's INR of *** and patient's INR goal of 2-3.  - Will *** current warfarin dose: ***  Patient verbalized understanding and was provided with written instructions. Next INR check planned for ***.

## 2020-06-19 ENCOUNTER — Encounter: Payer: Self-pay | Admitting: Nurse Practitioner

## 2020-06-19 ENCOUNTER — Ambulatory Visit: Payer: Self-pay | Admitting: Pharmacist

## 2020-06-20 ENCOUNTER — Ambulatory Visit: Payer: MEDICAID | Attending: Nurse Practitioner | Admitting: Pharmacist

## 2020-06-20 ENCOUNTER — Encounter (HOSPITAL_BASED_OUTPATIENT_CLINIC_OR_DEPARTMENT_OTHER): Payer: Medicaid Other | Admitting: Internal Medicine

## 2020-06-20 ENCOUNTER — Other Ambulatory Visit: Payer: Self-pay

## 2020-06-20 DIAGNOSIS — Z9889 Other specified postprocedural states: Secondary | ICD-10-CM

## 2020-06-20 DIAGNOSIS — S91302A Unspecified open wound, left foot, initial encounter: Secondary | ICD-10-CM | POA: Diagnosis not present

## 2020-06-20 DIAGNOSIS — Z86711 Personal history of pulmonary embolism: Secondary | ICD-10-CM

## 2020-06-20 LAB — POCT INR: INR: 3.1 — AB (ref 2.0–3.0)

## 2020-06-20 NOTE — Progress Notes (Signed)
[name], [name] ([id number]) Visit Report for 06/17/2020 SuperBill Details Patient Name: Date of Service: Leonard Bentley 06/17/2020 Medical Record Number: [id number] Patient Account Number: [id number] Date of Birth/Sex: Treating RN: [birth year] (58 y.o. Janyth Contes Primary Care Provider: [name] Other Clinician: Referring Provider: Treating Provider/Extender: Azucena Kuba Weeks in Treatment: 5 Diagnosis Coding ICD-10 Codes Code Description [phone] Unspecified open wound, left foot, initial encounter T81.31XA Disruption of external operation (surgical) wound, not elsewhere classified, initial encounter F17.218 Nicotine dependence, cigarettes, with other nicotine-induced disorders Facility Procedures CPT4 Code Description Modifier Quantity [id number] - WOUND VAC-50 SQ CM OR LESS 1 Electronic Signature(s) Signed: 06/17/2020 4:52:06 PM By: Tobi Bastos MD, MBA Signed: 06/20/2020 5:19:54 PM By: Levan Hurst RN, BSN Entered By: Levan Hurst on 06/17/2020 08:32:25

## 2020-06-20 NOTE — Progress Notes (Signed)
VIRGIL, LIGHTNER ([id number]) Visit Report for 06/17/2020 Arrival Information Details Patient Name: Date of Service: Leonard Bentley 06/17/2020 8:15 A M Medical Record Number: [id number] Patient Account Number: [id number] Date of Birth/Sex: Treating RN: 1961/06/01 (59 y.o. Leonard Bentley) Carlene Coria Primary Care Ivey Cina: [name] Other Clinician: Referring Rolland Steinert: Treating Avia Merkley/Extender: Azucena Kuba Weeks in Treatment: 5 Visit Information History Since Last Visit Added or deleted any medications: No Patient Arrived: Leonard Bentley Any new allergies or adverse reactions: No Arrival Time: 08:00 Had a fall or experienced change in No Accompanied By: self activities of daily living that may affect Transfer Assistance: None risk of falls: Patient Identification Verified: Yes Signs or symptoms of abuse/neglect since last visito No Secondary Verification Process Completed: Yes Hospitalized since last visit: No Patient Requires Transmission-Based Precautions: No Implantable device outside of the clinic excluding No Patient Has Alerts: Yes cellular tissue based products placed in the center Patient Alerts: Patient on Blood Thinner since last visit: L ABI: 1.03 (01/2020) Has Dressing in Place as Prescribed: Yes Pain Present Now: Yes Electronic Signature(s) Signed: 06/17/2020 10:17:44 AM By: Sandre Kitty Entered By: Sandre Kitty on 06/17/2020 08:00:57 -------------------------------------------------------------------------------- Encounter Discharge Information Details Patient Name: Date of Service: Leonard Dunning A. 06/17/2020 8:15 A M Medical Record Number: [id number] Patient Account Number: [id number] Date of Birth/Sex: Treating RN: 29-Feb-1961 (59 y.o. Leonard Bentley Primary Care Brande Uncapher: [name] Other Clinician: Referring Kynzlie Hilleary: Treating Lorana Maffeo/Extender: Marcos Eke in Treatment: 5 Encounter Discharge Information  Items Discharge Condition: Stable Ambulatory Status: Walker Discharge Destination: Home Transportation: Private Auto Accompanied By: alone Schedule Follow-up Appointment: Yes Clinical Summary of Care: Patient Declined Electronic Signature(s) Signed: 06/20/2020 5:19:54 PM By: Levan Hurst RN, BSN Entered By: Levan Hurst on 06/17/2020 08:32:13 -------------------------------------------------------------------------------- Negative Pressure Wound Therapy Maintenance (NPWT) Details Patient Name: Date of Service: Leonard Bentley 06/17/2020 8:15 A M Medical Record Number: [id number] Patient Account Number: [id number] Date of Birth/Sex: Treating RN: Apr 02, 1961 (59 y.o. Leonard Bentley Primary Care Jamilett Ferrante: [name] Other Clinician: Referring Loray Akard: Treating Farrie Sann/Extender: Azucena Kuba Weeks in Treatment: 5 NPWT Maintenance Performed for: Wound #1 Left Amputation Site - Transmetatarsal Performed By: Levan Hurst, RN Type: VAC System Coverage Size (sq cm): 8.82 Pressure Type: Constant Pressure Setting: 125 mmHG Drain Type: Primary Contact: Other : silver collagen Sponge/Dressing Type: Foam, Black Date Initiated: 05/20/2020 Dressing Removed: Yes Quantity of Sponges/Gauze Removed: 1 piece foam removed Canister Changed: No Dressing Reapplied: Yes Quantity of Sponges/Gauze Inserted: 1 piece foam applied Respones T Treatment: o pt tolerated well Days On NPWT : 29 Electronic Signature(s) Signed: 06/20/2020 5:19:54 PM By: Levan Hurst RN, BSN Entered By: Levan Hurst on 06/17/2020 08:31:19 -------------------------------------------------------------------------------- Wound Assessment Details Patient Name: Date of Service: Leonard Dunning A. 06/17/2020 8:15 A M Medical Record Number: [id number] Patient Account Number: [id number] Date of Birth/Sex: Treating RN: 09/06/1961 (59 y.o. Leonard Bentley) Carlene Coria Primary Care Trystin Hargrove: [name] Other  Clinician: Referring Flem Enderle: Treating Alysson Geist/Extender: Windy Carina, Leonard Bentley Weeks in Treatment: 5 Wound Status Wound Number: 1 Primary Etiology: Dehisced Wound Wound Location: Left Amputation Site - Transmetatarsal Wound Status: Open Wounding Event: Surgical Injury Date Acquired: 03/21/2020 Weeks Of Treatment: 5 Clustered Wound: No Wound Measurements Length: (cm) 2.1 Width: (cm) 4.2 Depth: (cm) 0.4 Area: (cm) 6.927 Volume: (cm) 2.771 % Reduction in Area: 80.6% % Reduction in Volume: 97.8% Wound Description Classification: Full Thickness With Exposed Support Structures Electronic Signature(s) Signed: 06/17/2020 10:17:44 AM By: Rudean Curt,  [place] Signed: 06/17/2020 5:05:11 PM By: Carlene Coria RN Entered By: Sandre Kitty on 06/17/2020 08:01:22 -------------------------------------------------------------------------------- Vitals Details Patient Name: Date of Service: Leonard Dunning A. 06/17/2020 8:15 A M Medical Record Number: [id number] Patient Account Number: [id number] Date of Birth/Sex: Treating RN: [birth year] (59 y.o. Leonard Bentley) Carlene Coria Primary Care Sheily Lineman: [name] Other Clinician: Referring Finneas Mathe: Treating Zennie Ayars/Extender: Azucena Kuba Weeks in Treatment: 5 Vital Signs Time Taken: 08:00 Temperature (F): 97.7 Height (in): 69 Pulse (bpm): 84 Weight (lbs): 190 Respiratory Rate (breaths/min): 18 Body Mass Index (BMI): 28.1 Blood Pressure (mmHg): 115/86 Reference Range: 80 - 120 mg / dl Electronic Signature(s) Signed: 06/17/2020 10:17:44 AM By: Sandre Kitty Entered By: Sandre Kitty on 06/17/2020 08:01:13

## 2020-06-20 NOTE — Progress Notes (Signed)
LINUS, WECKERLY ([id number]) Visit Report for 06/20/2020 HPI Details Patient Name: Date of Service: Leonard Bentley 06/20/2020 12:45 PM Medical Record Number: [id number] Patient Account Number: [id number] Date of Birth/Sex: Treating RN: 1961/04/30 (59 y.o. [name] Primary Care Provider: [name] Other Clinician: Referring Provider: Treating Provider/Extender: Azucena Kuba Weeks in Treatment: 6 History of Present Illness HPI Description: 05/07/2020 upon evaluation today patient appears to be doing somewhat poorly in regard to a surgery site on his left foot which is a transmetatarsal amputation site. He actually had his second and third toes removed in March 2021. Unfortunately that did not heal appropriately and so he subsequently in May he had a transmetatarsal amputation performed by Dr. [name]. Subsequently this unfortunately also dehisced and though he still has sutures in place this has been about 6 weeks out and unfortunately the sutures are not holding anything together there is necrotic tissue that the sutures are actually embedded into and overall this really needs to be cleaned out and to be honest I think the patient really needs a wound VAC. With that being said there does not fortunately appear to be any signs of infection which is good news. Nonetheless I do believe this is definitely going to be a significant time to healing even with the wound VAC in order to get this completely closed. There is a lot of depth to the wound through the necrotic tissue and again a lot of the necrotic tissue needs to be removed we may be able to do some of that today I am not thinking we will get everything cleaned out however. No fevers, chills, nausea, vomiting, or diarrhea. The patient does have a history of nicotine dependence and we did discuss that in detail today but other than that he really has no major medical problems at this point other than what is going on  with his feet. In fact it really has not even been a determination as to why he is developing the wounds and having trouble healing his blood flow is good he does however smoke which could be affecting things in my opinion. 05/14/20 upon evaluation today patient appears to be doing better in my opinion. Overall the appearance of the wound is improved and I am very pleased with that. He has a tremendous amount of the necrotic tissue that between debridement last week, the use of Anasept in the interim, and debridement today has cleared away and again though he does have some depth this appears to be looking much more healthy. Overall I think a wound VAC would still do excellent for him even more so at this point. 7/16; patient I am seeing for the first time today. Considerable improvement in the surgical wound of the left TMA site. 4 days on a wound VAC. The majority of this is healthy granulated tissue. Some surface slough. Looking back through the pictures there is really been a tremendous improvement since his arrival in the clinic. He does not have a macrovascular issue. 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 . Silver collagen under the VAC. He is making nice progress 06/06/2020 on evaluation today patient actually appears to be doing quite well with regard to his wound. He is making good progress and very pleased in that regard. There is no signs of active infection at this time which is also good news. He seems to be tolerating the wound VAC without any  complication and I am seeing excellent improvement especially compared to where things started when I first saw him here in the clinic. He is very pleased with where things stand. 06/13/2020 on evaluation today patient's wound actually showed signs of excellent improvement. I am actually very pleased with where things stand and he is progressing quite nicely. 06/20/20-Patient comes today with left  transmetatarsal amputation wound looking good, he did notice a small suture that popped up on the medial edge of the wound which was removed today Electronic Signature(s) Signed: 06/20/2020 1:26:26 PM By: Cassandria Anger MD, MBA Entered By: Cassandria Anger on 06/20/2020 13:26:25 -------------------------------------------------------------------------------- Physical Exam Details Patient Name: Date of Service: Leonard Raider A. 06/20/2020 12:45 PM Medical Record Number: [id number] Patient Account Number: [id number] Date of Birth/Sex: Treating RN: [birth year] (59 y.o. Katherina Right Primary Care Provider: Bertram Denver Other Clinician: Referring Provider: Treating Provider/Extender: Kathi Ludwig Weeks in Treatment: 6 Constitutional alert and oriented x 3. sitting or standing blood pressure is within target range for patient.. supine blood pressure is within target range for patient.. pulse regular and within target range for patient.Marland Kitchen respirations regular, non-labored and within target range for patient.Marland Kitchen temperature within target range for patient.. . . Well- nourished and well-hydrated in no acute distress. Notes Left transmet amputation wound with clean granulation, surrounding skin intact, small remnant suture was removed with suture kit Electronic Signature(s) Signed: 06/20/2020 1:27:01 PM By: Cassandria Anger MD, MBA Entered By: Cassandria Anger on 06/20/2020 13:27:00 -------------------------------------------------------------------------------- Physician Orders Details Patient Name: Date of Service: Leonard Raider A. 06/20/2020 12:45 PM Medical Record Number: [id number] Patient Account Number: [id number] Date of Birth/Sex: Treating RN: [birth year] (59 y.o. Katherina Right Primary Care Provider: Bertram Denver Other Clinician: Referring Provider: Treating Provider/Extender: Kathi Ludwig Weeks in Treatment: 6 Verbal / Phone Orders:  No Diagnosis Coding Follow-up Appointments ppointment in 1 week. - MD Return A Nurse Visit: - Tuesday Dressing Change Frequency Wound #1 Left Amputation Site - Transmetatarsal Other: - twice a week Wound Cleansing Wound #1 Left Amputation Site - Transmetatarsal Clean wound with Wound Cleanser Primary Wound Dressing Wound #1 Left Amputation Site - Transmetatarsal Silver Collagen - to base Other: - wound vac Secondary Dressing Wound #1 Left Amputation Site - Transmetatarsal ABD pad Other: - ace wrap Negative Presssure Wound Therapy Wound Vac to wound continuously at 15mm/hg pressure Black Foam Edema Control Avoid standing for long periods of time Elevate legs to the level of the heart or above for 30 minutes daily and/or when sitting, a frequency of: Off-Loading Other: - minimal weight bearing left foot Electronic Signature(s) Signed: 06/20/2020 4:53:28 PM By: Cherylin Mylar Signed: 06/20/2020 4:53:36 PM By: Cassandria Anger MD, MBA Entered By: Cherylin Mylar on 06/20/2020 13:26:11 -------------------------------------------------------------------------------- Progress Note Details Patient Name: Date of Service: Leonard Raider A. 06/20/2020 12:45 PM Medical Record Number: [id number] Patient Account Number: [id number] Date of Birth/Sex: Treating RN: [birth year] (59 y.o. Katherina Right Primary Care Provider: Bertram Denver Other Clinician: Referring Provider: Treating Provider/Extender: Kathi Ludwig Weeks in Treatment: 6 Subjective History of Present Illness (HPI) 05/07/2020 upon evaluation today patient appears to be doing somewhat poorly in regard to a surgery site on his left foot which is a transmetatarsal amputation site. He actually had his second and third toes removed in March 2021. Unfortunately that did not heal appropriately and so he subsequently in May he had a transmetatarsal amputation performed by Dr. Lajoyce Corners. Subsequently this  unfortunately also dehisced  and though he still has sutures in place this has been about 6 weeks out and unfortunately the sutures are not holding anything together there is necrotic tissue that the sutures are actually embedded into and overall this really needs to be cleaned out and to be honest I think the patient really needs a wound VAC. With that being said there does not fortunately appear to be any signs of infection which is good news. Nonetheless I do believe this is definitely going to be a significant time to healing even with the wound VAC in order to get this completely closed. There is a lot of depth to the wound through the necrotic tissue and again a lot of the necrotic tissue needs to be removed we may be able to do some of that today I am not thinking we will get everything cleaned out however. No fevers, chills, nausea, vomiting, or diarrhea. The patient does have a history of nicotine dependence and we did discuss that in detail today but other than that he really has no major medical problems at this point other than what is going on with his feet. In fact it really has not even been a determination as to why he is developing the wounds and having trouble healing his blood flow is good he does however smoke which could be affecting things in my opinion. 05/14/20 upon evaluation today patient appears to be doing better in my opinion. Overall the appearance of the wound is improved and I am very pleased with that. He has a tremendous amount of the necrotic tissue that between debridement last week, the use of Anasept in the interim, and debridement today has cleared away and again though he does have some depth this appears to be looking much more healthy. Overall I think a wound VAC would still do excellent for him even more so at this point. 7/16; patient I am seeing for the first time today. Considerable improvement in the surgical wound of the left TMA site. 4 days on a wound VAC.  The majority of this is healthy granulated tissue. Some surface slough. Looking back through the pictures there is really been a tremendous improvement since his arrival in the clinic. He does not have a macrovascular issue. 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 . Silver collagen under the VAC. He is making nice progress 06/06/2020 on evaluation today patient actually appears to be doing quite well with regard to his wound. He is making good progress and very pleased in that regard. There is no signs of active infection at this time which is also good news. He seems to be tolerating the wound VAC without any complication and I am seeing excellent improvement especially compared to where things started when I first saw him here in the clinic. He is very pleased with where things stand. 06/13/2020 on evaluation today patient's wound actually showed signs of excellent improvement. I am actually very pleased with where things stand and he is progressing quite nicely. 06/20/20-Patient comes today with left transmetatarsal amputation wound looking good, he did notice a small suture that popped up on the medial edge of the wound which was removed today Objective Constitutional alert and oriented x 3. sitting or standing blood pressure is within target range for patient.. supine blood pressure is within target range for patient.. pulse regular and within target range for patient.Marland Kitchen respirations regular, non-labored and within target range for patient.Marland Kitchen temperature  within target range for patient.. Well- nourished and well-hydrated in no acute distress. Vitals Time Taken: 12:56 PM, Height: 69 in, Weight: 190 lbs, BMI: 28.1, Temperature: 97.6 F, Pulse: 67 bpm, Respiratory Rate: 18 breaths/min, Blood Pressure: 123/92 mmHg. General Notes: Left transmet amputation wound with clean granulation, surrounding skin intact, small remnant suture was removed with suture  kit Integumentary (Hair, Skin) Wound #1 status is Open. Original cause of wound was Surgical Injury. The wound is located on the Left Amputation Site - Transmetatarsal. The wound measures 1.7cm length x 3.5cm width x 0.7cm depth; 4.673cm^2 area and 3.271cm^3 volume. There is Fat Layer (Subcutaneous Tissue) Exposed exposed. There is no tunneling or undermining noted. There is a medium amount of serosanguineous drainage noted. There is large (67-100%) red, pink granulation within the wound bed. There is a small (1-33%) amount of necrotic tissue within the wound bed including Adherent Slough. Plan Follow-up Appointments: Return Appointment in 1 week. - MD Nurse Visit: - Tuesday Dressing Change Frequency: Wound #1 Left Amputation Site - Transmetatarsal: Other: - twice a week Wound Cleansing: Wound #1 Left Amputation Site - Transmetatarsal: Clean wound with Wound Cleanser Primary Wound Dressing: Wound #1 Left Amputation Site - Transmetatarsal: Silver Collagen - to base Other: - wound vac Secondary Dressing: Wound #1 Left Amputation Site - Transmetatarsal: ABD pad Other: - ace wrap Negative Presssure Wound Therapy: Wound Vac to wound continuously at 14m/hg pressure Black Foam Edema Control: Avoid standing for long periods of time Elevate legs to the level of the heart or above for 30 minutes daily and/or when sitting, a frequency of: Off-Loading: Other: - minimal weight bearing left foot -Continue with Prisma lining the wound, with wound VAC -Continue dressing changes twice weekly -Return to clinic next week Electronic Signature(s) Signed: 06/20/2020 1:28:13 PM By: MTobi BastosMD, MBA Entered By: MTobi Bastoson 06/20/2020 13:28:12 -------------------------------------------------------------------------------- SuperBill Details Patient Name: Date of Service: DVicenta DunningA. 06/20/2020 Medical Record Number: [id number] Account Number: [id number] of  Birth/Sex: Treating RN: 107/07/1961(59y.o. MMarvis RepressPrimary Care Provider: FGeryl RankinsOther Clinician: Referring Provider: Treating Provider/Extender: MAzucena KubaWeeks in Treatment: 6 Diagnosis Coding ICD-10 Codes Code Description [phone] open wound, left foot, initial encounter T81.31XA Disruption of external operation (surgical) wound, not elsewhere classified, initial encounter F17.218 Nicotine dependence, cigarettes, with other nicotine-induced disorders Facility Procedures CPT4 Code: [id number]: 994076- WOUND VAC-50 SQ CM OR LESS Modifier: Quantity: 1 Physician Procedures : CPT4 Code Description Modifier [id number] - WC PHYS LEVEL 3 - EST PT ICD-10 Diagnosis Description S91.302A Unspecified open wound, left foot, initial encounter Quantity: 1 Electronic Signature(s) Signed: 06/20/2020 4:53:28 PM By: DKela MillinSigned: 06/20/2020 4:53:36 PM By: MTobi BastosMD, MBA Previous Signature: 06/20/2020 1:28:26 PM Version By: MTobi BastosMD, MBA Entered By: DKela Millinon 06/20/2020 13:29:37

## 2020-06-23 NOTE — Telephone Encounter (Signed)
Since I am not treating him at this time I cannot prescribe narcotics. He should ask the wound care doctor to prescribe. Thanks.

## 2020-06-24 ENCOUNTER — Encounter (HOSPITAL_BASED_OUTPATIENT_CLINIC_OR_DEPARTMENT_OTHER): Payer: Medicaid Other | Admitting: Internal Medicine

## 2020-06-24 ENCOUNTER — Other Ambulatory Visit: Payer: Self-pay

## 2020-06-24 DIAGNOSIS — S91302A Unspecified open wound, left foot, initial encounter: Secondary | ICD-10-CM | POA: Diagnosis not present

## 2020-06-24 NOTE — Progress Notes (Signed)
Leonard, Bentley ([id number]) Visit Report for 06/24/2020 Arrival Information Details Patient Name: Date of Service: Leonard Bentley 06/24/2020 10:00 A M Medical Record Number: [id number] Patient Account Number: [id number] Date of Birth/Sex: Treating RN: [birth year] (59 y.o. Leonard Bentley, [name] Primary Care Johncarlo Maalouf: [name] Other Clinician: Referring Wanona Stare: Treating Tytianna Greenley/Extender: [name] in Treatment: 6 Visit Information History Since Last Visit Added or deleted any medications: No Patient Arrived: [name] Any new allergies or adverse reactions: No Arrival Time: 09:54 Had a fall or experienced change in No Accompanied By: self activities of daily living that may affect Transfer Assistance: None risk of falls: Patient Identification Verified: Yes Signs or symptoms of abuse/neglect since No Secondary Verification Process Completed: Yes last visito Patient Requires Transmission-Based Precautions: No Hospitalized since last visit: No Patient Has Alerts: Yes Implantable device outside of the clinic No Patient Alerts: Patient on Blood Thinner excluding L ABI: 1.03 (01/2020) cellular tissue based products placed in the center since last visit: Has Dressing in Place as Prescribed: Yes Has Footwear/Offloading in Place as Yes Prescribed: Left: Surgical Shoe with Pressure Relief Insole Pain Present Now: No Electronic Signature(s) Signed: 06/24/2020 5:25:51 PM By: [name] RN, BSN Entered By: [name] on 06/24/2020 09:55:53 -------------------------------------------------------------------------------- Encounter Discharge Information Details Patient Name: Date of Service: Leonard Dunning A. 06/24/2020 10:00 A M Medical Record Number: [id number] Patient Account Number: [id number] Date of Birth/Sex: Treating RN: [birth year] (59 y.o. [name] Primary Care Johnnette Laux: [name] Other Clinician: Referring Aela Bohan: Treating  Treylin Burtch/Extender: [name] in Treatment: 6 Encounter Discharge Information Items Discharge Condition: Stable Ambulatory Status: Walker Discharge Destination: Home Transportation: Other Accompanied By: self Schedule Follow-up Appointment: Yes Clinical Summary of Care: Patient Declined Notes SCAT Electronic Signature(s) Signed: 06/24/2020 5:25:51 PM By: [name] RN, BSN Entered By: [name] on 06/24/2020 10:04:36 -------------------------------------------------------------------------------- Negative Pressure Wound Therapy Maintenance (NPWT) Details Patient Name: Date of Service: Leonard Bentley 06/24/2020 10:00 A M Medical Record Number: [id number] Patient Account Number: [id number] Date of Birth/Sex: Treating RN: 1961/03/30 (59 y.o. [name] Primary Care Starr Urias: [name] Other Clinician: Referring Lawson Isabell: Treating Latash Nouri/Extender: Tomasa Blase, Paulette Blanch in Treatment: 6 NPWT Maintenance Performed for: Wound #1 Left Amputation Site - Transmetatarsal Performed By: [name], RN Type: VAC System Coverage Size (sq cm): 5.95 Pressure Type: Constant Pressure Setting: 125 mmHG Drain Type: Primary Contact: Other : silver collagen Sponge/Dressing Type: Foam, Black Date Initiated: 05/20/2020 Dressing Removed: No Quantity of Sponges/Gauze Removed: 1 Canister Changed: No Dressing Reapplied: No Quantity of Sponges/Gauze Inserted: 1 Respones T Treatment: o good Days On NPWT : 36 Electronic Signature(s) Signed: 06/24/2020 5:25:51 PM By: [name] RN, BSN Entered By: [name] on 06/24/2020 10:02:40 -------------------------------------------------------------------------------- Patient/Caregiver Education Details Patient Name: Date of Service: Leonard Bentley 8/17/2021andnbsp10:00 A M Medical Record Number: [id number] Patient Account Number: [id number] Date of Birth/Gender: Treating  RN: 1961/10/24 (59 y.o. [name] Primary Care Physician: [name] Other Clinician: Referring Physician: Treating Physician/Extender: [name] in Treatment: 6 Education Assessment Education Provided To: Patient Education Topics Provided Offloading: Methods: Explain/Verbal Responses: Reinforcements needed, State content correctly Wound/Skin Impairment: Methods: Explain/Verbal Responses: Reinforcements needed, State content correctly Electronic Signature(s) Signed: 06/24/2020 5:25:51 PM By: [name] RN, BSN Entered By: [name] on 06/24/2020 10:03:55 -------------------------------------------------------------------------------- Wound Assessment Details Patient Name: Date of Service: Leonard Dunning A. 06/24/2020 10:00 A M Medical Record Number: [id number] Patient  Account Number: [id number] Date of Birth/Sex: Treating RN: 08/28/1961 (59 y.o. [name] Primary Care Kristena Wilhelmi: [name] Other Clinician: Referring Jaileen Janelle: Treating Aricela Bertagnolli/Extender: [name] in Treatment: 6 Wound Status Wound Number: 1 Primary Dehisced Wound Etiology: Wound Location: Left Amputation Site - Transmetatarsal Wound Open Wounding Event: Surgical Injury Status: Date Acquired: 03/21/2020 Comorbid Anemia, Arrhythmia, Congestive Heart Failure, Hypertension, Weeks Of Treatment: 6 History: Osteoarthritis, Osteomyelitis, Neuropathy, Received Clustered Wound: No Chemotherapy Wound Measurements Length: (cm) 1.7 Width: (cm) 3.5 Depth: (cm) 0.7 Area: (cm) 4.673 Volume: (cm) 3.271 % Reduction in Area: 86.9% % Reduction in Volume: 97.4% Epithelialization: Medium (34-66%) Tunneling: No Undermining: No Wound Description Classification: Full Thickness With Exposed Support Structures Wound Margin: Flat and Intact Exudate Amount: Medium Exudate Type: Serosanguineous Exudate Color: red, brown Foul Odor  After Cleansing: No Slough/Fibrino Yes Wound Bed Granulation Amount: Large (67-100%) Exposed Structure Granulation Quality: Red, Pink Fascia Exposed: No Necrotic Amount: Small (1-33%) Fat Layer (Subcutaneous Tissue) Exposed: Yes Necrotic Quality: Adherent Slough Tendon Exposed: No Muscle Exposed: No Joint Exposed: No Bone Exposed: No Treatment Notes Wound #1 (Left Amputation Site - Transmetatarsal) 2. Periwound Care Skin Prep 3. Primary Dressing Applied [employer] Other primary dressing (specifiy in notes) 4. Secondary Dressing Roll Gauze 5. Secured With Elastic bandage Notes [employer] wound vac , 1 piece black foam inserted, pressure set at 125 mmHg with good seal Electronic Signature(s) Signed: 06/24/2020 5:25:51 PM By: [name] RN, BSN Entered By: [name] on 06/24/2020 10:01:41 -------------------------------------------------------------------------------- Vitals Details Patient Name: Date of Service: Leonard Dunning A. 06/24/2020 10:00 A M Medical Record Number: [id number] Patient Account Number: [id number] Date of Birth/Sex: Treating RN: May 31, 1961 (59 y.o. [name] Primary Care Kynan Peasley: [name] Other Clinician: Referring Lamona Eimer: Treating Broden Holt/Extender: [name] in Treatment: 6 Vital Signs Time Taken: 09:56 Temperature (F): 97.5 Height (in): 69 Pulse (bpm): 67 Source: Stated Respiratory Rate (breaths/min): 18 Weight (lbs): 190 Blood Pressure (mmHg): 127/92 Source: Stated Reference Range: 80 - 120 mg / dl Body Mass Index (BMI): 28.1 Electronic Signature(s) Signed: 06/24/2020 5:25:51 PM By: [name] RN, BSN Entered By: [name] on 06/24/2020 09:58:24

## 2020-06-24 NOTE — Progress Notes (Signed)
SHAMARCUS, HOHEISEL ([id number]) Visit Report for 06/24/2020 SuperBill Details Patient Name: Date of Service: Leonard Bentley 06/24/2020 Medical Record Number: [id number] Patient Account Number: [id number] Date of Birth/Sex: Treating RN: 02-11-1961 (59 y.o. [name] Primary Care Provider: [name] Other Clinician: Referring Provider: Treating Provider/Extender: [name] in Treatment: 6 Diagnosis Coding ICD-10 Codes Code Description [phone] Unspecified open wound, left foot, initial encounter T81.31XA Disruption of external operation (surgical) wound, not elsewhere classified, initial encounter F17.218 Nicotine dependence, cigarettes, with other nicotine-induced disorders Facility Procedures CPT4 Code Description Modifier Quantity [id number] - WOUND VAC-50 SQ CM OR LESS 1 Electronic Signature(s) Signed: 06/24/2020 5:25:51 PM By: [name] RN, BSN Signed: 06/24/2020 6:57:29 PM By: Linton Ham MD Entered By: [name] on 06/24/2020 10:04:54

## 2020-06-26 ENCOUNTER — Ambulatory Visit: Payer: Self-pay | Admitting: Pharmacist

## 2020-06-26 DIAGNOSIS — Z9889 Other specified postprocedural states: Secondary | ICD-10-CM

## 2020-06-26 DIAGNOSIS — Z86711 Personal history of pulmonary embolism: Secondary | ICD-10-CM

## 2020-06-27 ENCOUNTER — Ambulatory Visit: Payer: Self-pay | Admitting: Pharmacist

## 2020-06-27 ENCOUNTER — Encounter (HOSPITAL_BASED_OUTPATIENT_CLINIC_OR_DEPARTMENT_OTHER): Payer: Medicaid Other | Admitting: Internal Medicine

## 2020-06-27 DIAGNOSIS — S91302A Unspecified open wound, left foot, initial encounter: Secondary | ICD-10-CM | POA: Diagnosis not present

## 2020-06-30 NOTE — Progress Notes (Signed)
Leonard, Bentley ([id number]) Visit Report for 06/27/2020 HPI Details Patient Name: Date of Service: Leonard Bentley 06/27/2020 2:00 PM Medical Record Number: [id number] Patient Account Number: [id number] Date of Birth/Sex: Treating RN: 08-27-1961 (59 y.o. [name] Primary Care Provider: [name] Other Clinician: Referring Provider: Treating Provider/Extender: [name] in Treatment: 7 History of Present Illness HPI Description: 05/07/2020 upon evaluation today patient appears to be doing somewhat poorly in regard to a surgery site on his left foot which is a transmetatarsal amputation site. He actually had his second and third toes removed in March 2021. Unfortunately that did not heal appropriately and so he subsequently in May he had a transmetatarsal amputation performed by Dr. [name]. Subsequently this unfortunately also dehisced and though he still has sutures in place this has been about 6 weeks out and unfortunately the sutures are not holding anything together there is necrotic tissue that the sutures are actually embedded into and overall this really needs to be cleaned out and to be honest I think the patient really needs a wound VAC. With that being said there does not fortunately appear to be any signs of infection which is good news. Nonetheless I do believe this is definitely going to be a significant time to healing even with the wound VAC in order to get this completely closed. There is a lot of depth to the wound through the necrotic tissue and again a lot of the necrotic tissue needs to be removed we may be able to do some of that today I am not thinking we will get everything cleaned out however. No fevers, chills, nausea, vomiting, or diarrhea. The patient does have a history of nicotine dependence and we did discuss that in detail today but other than that he really has no major medical problems at this point other than what is going on  with his feet. In fact it really has not even been a determination as to why he is developing the wounds and having trouble healing his blood flow is good he does however smoke which could be affecting things in my opinion. 05/14/20 upon evaluation today patient appears to be doing better in my opinion. Overall the appearance of the wound is improved and I am very pleased with that. He has a tremendous amount of the necrotic tissue that between debridement last week, the use of Anasept in the interim, and debridement today has cleared away and again though he does have some depth this appears to be looking much more healthy. Overall I think a wound VAC would still do excellent for him even more so at this point. 7/16; patient I am seeing for the first time today. Considerable improvement in the surgical wound of the left TMA site. 4 days on a wound VAC. The majority of this is healthy granulated tissue. Some surface slough. Looking back through the pictures there is really been a tremendous improvement since his arrival in the clinic. He does not have a macrovascular issue. 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 . Silver collagen under the VAC. He is making nice progress 06/06/2020 on evaluation today patient actually appears to be doing quite well with regard to his wound. He is making good progress and very pleased in that regard. There is no signs of active infection at this time which is also good news. He seems to be tolerating the wound VAC without any  complication and I am seeing excellent improvement especially compared to where things started when I first saw him here in the clinic. He is very pleased with where things stand. 06/13/2020 on evaluation today patient's wound actually showed signs of excellent improvement. I am actually very pleased with where things stand and he is progressing quite nicely. 06/20/20-Patient comes today with left  transmetatarsal amputation wound looking good, he did notice a small suture that popped up on the medial edge of the wound which was removed today 8/20; left transmetatarsal amputation site this looks quite a bit better than when I last saw this. He has 2 linear areas with some depth and minimal undermining. The rest of this is superficial. Everything looks healthy. He is offloading this with the knees crutch and a walker Electronic Signature(s) Signed: 06/30/2020 7:15:26 AM By: Linton Ham MD Entered By: Linton Ham on 06/27/2020 16:36:40 -------------------------------------------------------------------------------- Physical Exam Details Patient Name: Date of Service: Leonard Dunning A. 06/27/2020 2:00 PM Medical Record Number: [id number] Patient Account Number: [id number] Date of Birth/Sex: Treating RN: Jun 19, 1961 (59 y.o. [name] Primary Care Provider: [name] Other Clinician: Referring Provider: Treating Provider/Extender: [name] in Treatment: 7 Constitutional Patient is hypertensive.. Pulse regular and within target range for patient.Marland Kitchen Respirations regular, non-labored and within target range.. Temperature is normal and within the target range for the patient.Marland Kitchen Appears in no distress. Cardiovascular Pedal pulses are intact. Notes Wound exam; left transmetatarsal amputation site has clean granulation. He has 2 small linear slit areas that are deeper perhaps 2 or 3 mm. Mild undermining no palpable bone no surrounding infection. Electronic Signature(s) Signed: 06/30/2020 7:15:26 AM By: Linton Ham MD Entered By: Linton Ham on 06/27/2020 16:37:44 -------------------------------------------------------------------------------- Physician Orders Details Patient Name: Date of Service: Leonard Dunning A. 06/27/2020 2:00 PM Medical Record Number: [id number] Patient Account Number: [id number] Date of Birth/Sex: Treating  RN: 11/11/1960 (59 y.o. [name] Primary Care Provider: [name] Other Clinician: Referring Provider: Treating Provider/Extender: [name] in Treatment: 7 Verbal / Phone Orders: No Diagnosis Coding ICD-10 Coding Code Description S91.302A Unspecified open wound, left foot, initial encounter T81.31XA Disruption of external operation (surgical) wound, not elsewhere classified, initial encounter F17.218 Nicotine dependence, cigarettes, with other nicotine-induced disorders Follow-up Appointments ppointment in 1 week. - MD Return A Nurse Visit: - Tuesday Dressing Change Frequency Wound #1 Left Amputation Site - Transmetatarsal Other: - twice a week Wound Cleansing Wound #1 Left Amputation Site - Transmetatarsal Clean wound with Wound Cleanser Primary Wound Dressing Wound #1 Left Amputation Site - Transmetatarsal Silver Collagen - to undermining and depth sites and over base of wound bed Other: - wound vac Secondary Dressing Wound #1 Left Amputation Site - Transmetatarsal ABD pad Other: - ace wrap Negative Presssure Wound Therapy Wound Vac to wound continuously at 145mm/hg pressure Black Foam Edema Control Avoid standing for long periods of time Elevate legs to the level of the heart or above for 30 minutes daily and/or when sitting, a frequency of: Off-Loading Other: - minimal weight bearing left foot Electronic Signature(s) Signed: 06/27/2020 4:56:28 PM By: Kela Millin Signed: 06/30/2020 7:15:26 AM By: Linton Ham MD Entered By: Kela Millin on 06/27/2020 15:22:42 -------------------------------------------------------------------------------- Problem List Details Patient Name: Date of Service: Leonard Dunning A. 06/27/2020 2:00 PM Medical Record Number: [id number] Patient Account Number: [id number] Date of Birth/Sex: Treating RN: [birth year] (59 y.o. [name] Primary Care Provider: [name] Other  Clinician: Referring Provider: Treating  Provider/Extender: Tomasa Blase, Paulette Blanch in Treatment: 7 Active Problems ICD-10 Encounter Code Description Active Date MDM Diagnosis S91.302A Unspecified open wound, left foot, initial encounter 05/07/2020 No Yes T81.31XA Disruption of external operation (surgical) wound, not elsewhere classified, 05/07/2020 No Yes initial encounter F17.218 Nicotine dependence, cigarettes, with other nicotine-induced disorders 05/07/2020 No Yes Inactive Problems Resolved Problems Electronic Signature(s) Signed: 06/30/2020 7:15:26 AM By: Linton Ham MD Entered By: Linton Ham on 06/27/2020 16:35:48 -------------------------------------------------------------------------------- Progress Note Details Patient Name: Date of Service: Leonard Dunning A. 06/27/2020 2:00 PM Medical Record Number: [id number] Patient Account Number: [id number] Date of Birth/Sex: Treating RN: 1961/10/22 (59 y.o. [name] Primary Care Provider: [name] Other Clinician: Referring Provider: Treating Provider/Extender: Tomasa Blase, Paulette Blanch in Treatment: 7 Subjective History of Present Illness (HPI) 05/07/2020 upon evaluation today patient appears to be doing somewhat poorly in regard to a surgery site on his left foot which is a transmetatarsal amputation site. He actually had his second and third toes removed in March 2021. Unfortunately that did not heal appropriately and so he subsequently in May he had a transmetatarsal amputation performed by Dr. [name]. Subsequently this unfortunately also dehisced and though he still has sutures in place this has been about 6 weeks out and unfortunately the sutures are not holding anything together there is necrotic tissue that the sutures are actually embedded into and overall this really needs to be cleaned out and to be honest I think the patient really needs a wound VAC. With that being said there  does not fortunately appear to be any signs of infection which is good news. Nonetheless I do believe this is definitely going to be a significant time to healing even with the wound VAC in order to get this completely closed. There is a lot of depth to the wound through the necrotic tissue and again a lot of the necrotic tissue needs to be removed we may be able to do some of that today I am not thinking we will get everything cleaned out however. No fevers, chills, nausea, vomiting, or diarrhea. The patient does have a history of nicotine dependence and we did discuss that in detail today but other than that he really has no major medical problems at this point other than what is going on with his feet. In fact it really has not even been a determination as to why he is developing the wounds and having trouble healing his blood flow is good he does however smoke which could be affecting things in my opinion. 05/14/20 upon evaluation today patient appears to be doing better in my opinion. Overall the appearance of the wound is improved and I am very pleased with that. He has a tremendous amount of the necrotic tissue that between debridement last week, the use of Anasept in the interim, and debridement today has cleared away and again though he does have some depth this appears to be looking much more healthy. Overall I think a wound VAC would still do excellent for him even more so at this point. 7/16; patient I am seeing for the first time today. Considerable improvement in the surgical wound of the left TMA site. 4 days on a wound VAC. The majority of this is healthy granulated tissue. Some surface slough. Looking back through the pictures there is really been a tremendous improvement since his arrival in the clinic. He does not have a macrovascular issue. He does have a history of recurrent PEs on  Coumadin 7/23; 7/23; left TMA amputation site. . Silver collagen under the VAC. He is making nice  progress 06/06/2020 on evaluation today patient actually appears to be doing quite well with regard to his wound. He is making good progress and very pleased in that regard. There is no signs of active infection at this time which is also good news. He seems to be tolerating the wound VAC without any complication and I am seeing excellent improvement especially compared to where things started when I first saw him here in the clinic. He is very pleased with where things stand. 06/13/2020 on evaluation today patient's wound actually showed signs of excellent improvement. I am actually very pleased with where things stand and he is progressing quite nicely. 06/20/20-Patient comes today with left transmetatarsal amputation wound looking good, he did notice a small suture that popped up on the medial edge of the wound which was removed today 8/20; left transmetatarsal amputation site this looks quite a bit better than when I last saw this. He has 2 linear areas with some depth and minimal undermining. The rest of this is superficial. Everything looks healthy. He is offloading this with the knees crutch and a walker Objective Constitutional Patient is hypertensive.. Pulse regular and within target range for patient.Marland Kitchen Respirations regular, non-labored and within target range.. Temperature is normal and within the target range for the patient.Marland Kitchen Appears in no distress. Vitals Time Taken: 2:20 PM, Height: 69 in, Weight: 190 lbs, BMI: 28.1, Temperature: 97.7 F, Pulse: 66 bpm, Respiratory Rate: 18 breaths/min, Blood Pressure: 152/87 mmHg. Cardiovascular Pedal pulses are intact. General Notes: Wound exam; left transmetatarsal amputation site has clean granulation. He has 2 small linear slit areas that are deeper perhaps 2 or 3 mm. Mild undermining no palpable bone no surrounding infection. Integumentary (Hair, Skin) Wound #1 status is Open. Original cause of wound was Surgical Injury. The wound is located on  the Left Amputation Site - Transmetatarsal. The wound measures 1.4cm length x 4cm width x 0.7cm depth; 4.398cm^2 area and 3.079cm^3 volume. There is Fat Layer (Subcutaneous Tissue) Exposed exposed. There is no tunneling or undermining noted. There is a medium amount of serosanguineous drainage noted. The wound margin is flat and intact. There is large (67-100%) red, pink granulation within the wound bed. There is a small (1-33%) amount of necrotic tissue within the wound bed including Adherent Slough. Assessment Active Problems ICD-10 Unspecified open wound, left foot, initial encounter Disruption of external operation (surgical) wound, not elsewhere classified, initial encounter Nicotine dependence, cigarettes, with other nicotine-induced disorders Plan Follow-up Appointments: Return Appointment in 1 week. - MD Nurse Visit: - Tuesday Dressing Change Frequency: Wound #1 Left Amputation Site - Transmetatarsal: Other: - twice a week Wound Cleansing: Wound #1 Left Amputation Site - Transmetatarsal: Clean wound with Wound Cleanser Primary Wound Dressing: Wound #1 Left Amputation Site - Transmetatarsal: Silver Collagen - to undermining and depth sites and over base of wound bed Other: - wound vac Secondary Dressing: Wound #1 Left Amputation Site - Transmetatarsal: ABD pad Other: - ace wrap Negative Presssure Wound Therapy: Wound Vac to wound continuously at 1100mm/hg pressure Black Foam Edema Control: Avoid standing for long periods of time Elevate legs to the level of the heart or above for 30 minutes daily and/or when sitting, a frequency of: Off-Loading: Other: - minimal weight bearing left foot 1. Silver collagen under wound VAC to continue. Try to get collagen lightly packed in the divot areas 2. Assuming cyst fills in the wound  VAC can stop 3. He is offloading this religiously Electronic Signature(s) Signed: 06/30/2020 7:15:26 AM By: Linton Ham MD Entered By: Linton Ham on 06/27/2020 16:38:33 -------------------------------------------------------------------------------- SuperBill Details Patient Name: Date of Service: Leonard Bentley 06/27/2020 Medical Record Number: [id number] Patient Account Number: [id number] Date of Birth/Sex: Treating RN: Jan 22, 1961 (59 y.o. [name] Primary Care Provider: [name] Other Clinician: Referring Provider: Treating Provider/Extender: [name] in Treatment: 7 Diagnosis Coding ICD-10 Codes Code Description [phone] Unspecified open wound, left foot, initial encounter T81.31XA Disruption of external operation (surgical) wound, not elsewhere classified, initial encounter F17.218 Nicotine dependence, cigarettes, with other nicotine-induced disorders Facility Procedures CPT4 Code: [id number] Description: 24199 - WOUND VAC-50 SQ CM OR LESS Modifier: Quantity: 1 Physician Procedures : CPT4 Code Description Modifier [id number] - WC PHYS LEVEL 3 - EST PT ICD-10 Diagnosis Description K35.075P Unspecified open wound, left foot, initial encounter T81.31XA Disruption of external operation (surgical) wound, not elsewhere classified,  initial encounter Quantity: 1 Electronic Signature(s) Signed: 06/30/2020 7:15:26 AM By: Linton Ham MD Entered By: Linton Ham on 06/27/2020 16:38:58

## 2020-07-01 ENCOUNTER — Encounter (HOSPITAL_BASED_OUTPATIENT_CLINIC_OR_DEPARTMENT_OTHER): Payer: Medicaid Other | Admitting: Internal Medicine

## 2020-07-01 DIAGNOSIS — S91302A Unspecified open wound, left foot, initial encounter: Secondary | ICD-10-CM | POA: Diagnosis not present

## 2020-07-01 NOTE — Progress Notes (Signed)
MASOUD, NYCE ([id number]) Visit Report for 06/09/2020 SuperBill Details Patient Name: Date of Service: Leonard Bentley 06/09/2020 Medical Record Number: [id number] Patient Account Number: [id number] Date of Birth/Sex: Treating RN: Sep 29, 1961 (59 y.o. Leonard Bentley) Carlene Coria Primary Care Provider: [name] Other Clinician: Referring Provider: Treating Provider/Extender: Tomasa Blase, Paulette Blanch in Treatment: 4 Diagnosis Coding ICD-10 Codes Code Description [phone] Unspecified open wound, left foot, initial encounter T81.31XA Disruption of external operation (surgical) wound, not elsewhere classified, initial encounter F17.218 Nicotine dependence, cigarettes, with other nicotine-induced disorders Facility Procedures CPT4 Code Description Modifier Quantity [id number] 97605 - WOUND VAC-50 SQ CM OR LESS 1 Electronic Signature(s) Signed: 06/11/2020 4:35:51 PM By: Carlene Coria RN Signed: 06/30/2020 4:33:08 PM By: Linton Ham MD Entered By: Carlene Coria on 06/09/2020 08:32:58

## 2020-07-01 NOTE — Progress Notes (Signed)
ULYESS, MUTO ([id number]) Visit Report for 06/27/2020 Arrival Information Details Patient Name: Date of Service: Leonard Bentley 06/27/2020 2:00 PM Medical Record Number: [id number] Patient Account Number: [id number] Date of Birth/Sex: Treating RN: [birth year] (59 y.o. [name] Primary Care Leza Apsey: [name] Other Clinician: Referring Endre Coutts: Treating Vania Rosero/Extender: [name] in Treatment: 7 Visit Information History Since Last Visit Added or deleted any medications: No Patient Arrived: [name] Any new allergies or adverse reactions: No Arrival Time: 14:18 Had a fall or experienced change in No Accompanied By: self activities of daily living that may affect Transfer Assistance: None risk of falls: Patient Identification Verified: Yes Signs or symptoms of abuse/neglect since last visito No Secondary Verification Process Completed: Yes Hospitalized since last visit: No Patient Requires Transmission-Based Precautions: No Implantable device outside of the clinic excluding No Patient Has Alerts: Yes cellular tissue based products placed in the center Patient Alerts: Patient on Blood Thinner since last visit: L ABI: 1.03 (01/2020) Has Dressing in Place as Prescribed: Yes Pain Present Now: Yes Electronic Signature(s) Signed: 06/30/2020 2:53:33 PM By: Sandre Kitty Entered By: Sandre Kitty on 06/27/2020 14:19:46 -------------------------------------------------------------------------------- Encounter Discharge Information Details Patient Name: Date of Service: Leonard Dunning A. 06/27/2020 2:00 PM Medical Record Number: [id number] Patient Account Number: [id number] Date of Birth/Sex: Treating RN: 1961-09-27 (59 y.o. [name] Primary Care Seth Higginbotham: [name] Other Clinician: Referring Alayja Armas: Treating Jameir Ake/Extender: [name] in Treatment: 7 Encounter Discharge Information  Items Discharge Condition: Stable Ambulatory Status: Walker Discharge Destination: Home Transportation: Other Accompanied By: self Schedule Follow-up Appointment: Yes Clinical Summary of Care: Patient Declined Electronic Signature(s) Signed: 06/27/2020 5:19:06 PM By: Kela Millin Entered By: Kela Millin on 06/27/2020 17:14:11 -------------------------------------------------------------------------------- Lower Extremity Assessment Details Patient Name: Date of Service: Leonard Dunning A. 06/27/2020 2:00 PM Medical Record Number: [id number] Patient Account Number: [id number] Date of Birth/Sex: Treating RN: 01-26-1961 (59 y.o. [name] Primary Care Gino Garrabrant: [name] Other Clinician: Referring Denzil Mceachron: Treating Oaklen Thiam/Extender: Tomasa Blase, Paulette Blanch in Treatment: 7 Edema Assessment Assessed: [Left: No] [Right: No] Edema: [Left: Ye] [Right: s] Calf Left: Right: Point of Measurement: 31 cm From Medial Instep 37 cm cm Ankle Left: Right: Point of Measurement: 11 cm From Medial Instep 27 cm cm Vascular Assessment Pulses: Dorsalis Pedis Palpable: [Left:Yes] Electronic Signature(s) Signed: 06/27/2020 4:56:28 PM By: Kela Millin Entered By: Kela Millin on 06/27/2020 15:14:12 -------------------------------------------------------------------------------- Multi Wound Chart Details Patient Name: Date of Service: Leonard Dunning A. 06/27/2020 2:00 PM Medical Record Number: [id number] Patient Account Number: [id number] Date of Birth/Sex: Treating RN: 1961-05-21 (59 y.o. [name] Primary Care Aleksandr Pellow: [name] Other Clinician: Referring Briante Loveall: Treating Kaikoa Magro/Extender: Tomasa Blase, Paulette Blanch in Treatment: 7 Vital Signs Height(in): 69 Pulse(bpm): 62 Weight(lbs): 190 Blood Pressure(mmHg): 152/87 Body Mass Index(BMI): 28 Temperature(F): 97.7 Respiratory Rate(breaths/min): 18 Photos:  [1:No Photos Left Amputation Site - Transmetatarsal N/A] [N/A:N/A] Wound Location: [1:Surgical Injury] [N/A:N/A] Wounding Event: [1:Dehisced Wound] [N/A:N/A] Primary Etiology: [1:Anemia, Arrhythmia, Congestive Heart N/A] Comorbid History: [1:Failure, Hypertension, Osteoarthritis, Osteomyelitis, Neuropathy, Received Chemotherapy 03/21/2020] [N/A:N/A] Date Acquired: [1:7] [N/A:N/A] Weeks of Treatment: [1:Open] [N/A:N/A] Wound Status: [1:1.4x4x0.7] [N/A:N/A] Measurements L x W x D (cm) [1:4.398] [N/A:N/A] A (cm) : rea [1:3.079] [N/A:N/A] Volume (cm) : [1:87.70%] [N/A:N/A] % Reduction in Area: [1:97.50%] [N/A:N/A] % Reduction in Volume: [1:Full Thickness With Exposed Support N/A] Classification: [1:Structures Medium] [N/A:N/A] Exudate Amount: [1:Serosanguineous] [N/A:N/A] Exudate Type: [1:red, brown] [N/A:N/A] Exudate Color: [1:Flat and  Intact] [N/A:N/A] Wound Margin: [1:Large (67-100%)] [N/A:N/A] Granulation Amount: [1:Red, Pink] [N/A:N/A] Granulation Quality: [1:Small (1-33%)] [N/A:N/A] Necrotic Amount: [1:Fat Layer (Subcutaneous Tissue): Yes N/A] Exposed Structures: [1:Fascia: No Tendon: No Muscle: No Joint: No Bone: No Medium (34-66%)] [N/A:N/A] Epithelialization: [1:Negative Pressure Wound Therapy] [N/A:N/A] Procedures Performed: [1:Maintenance (NPWT)] Treatment Notes Electronic Signature(s) Signed: 06/27/2020 4:56:28 PM By: Kela Millin Signed: 06/30/2020 7:15:26 AM By: Linton Ham MD Entered By: Linton Ham on 06/27/2020 16:35:56 -------------------------------------------------------------------------------- Multi-Disciplinary Care Plan Details Patient Name: Date of Service: Leonard Dunning A. 06/27/2020 2:00 PM Medical Record Number: [id number] Patient Account Number: [id number] Date of Birth/Sex: Treating RN: [birth year] (59 y.o. [name] Primary Care Shirlean Berman: [name] Other Clinician: Referring Nancylee Gaines: Treating Anniece Bleiler/Extender: [name] in Treatment: 7 Active Inactive Abuse / Safety / Falls / Self Care Management Nursing Diagnoses: Potential for falls Goals: Patient/caregiver will verbalize/demonstrate measures taken to prevent injury and/or falls Date Initiated: 05/07/2020 Target Resolution Date: 07/04/2020 Goal Status: Active Interventions: Assess fall risk on admission and as needed Assess impairment of mobility on admission and as needed per policy Notes: Necrotic Tissue Nursing Diagnoses: Impaired tissue integrity related to necrotic/devitalized tissue Knowledge deficit related to management of necrotic/devitalized tissue Goals: Necrotic/devitalized tissue will be minimized in the wound bed Date Initiated: 05/07/2020 Target Resolution Date: 07/04/2020 Goal Status: Active Patient/caregiver will verbalize understanding of reason and process for debridement of necrotic tissue Date Initiated: 05/07/2020 Target Resolution Date: 07/04/2020 Goal Status: Active Interventions: Assess patient pain level pre-, during and post procedure and prior to discharge Provide education on necrotic tissue and debridement process Treatment Activities: Apply topical anesthetic as ordered : 05/07/2020 Biologic debridement : 05/07/2020 Excisional debridement : 05/07/2020 Notes: Wound/Skin Impairment Nursing Diagnoses: Impaired tissue integrity Knowledge deficit related to smoking impact on wound healing Knowledge deficit related to ulceration/compromised skin integrity Goals: Patient will demonstrate a reduced rate of smoking or cessation of smoking Date Initiated: 05/07/2020 Target Resolution Date: 07/04/2020 Goal Status: Active Patient/caregiver will verbalize understanding of skin care regimen Date Initiated: 05/07/2020 Target Resolution Date: 07/04/2020 Goal Status: Active Ulcer/skin breakdown will have a volume reduction of 30% by week 4 Date Initiated: 05/07/2020 Target Resolution Date:  07/04/2020 Goal Status: Active Interventions: Assess patient/caregiver ability to obtain necessary supplies Assess patient/caregiver ability to perform ulcer/skin care regimen upon admission and as needed Assess ulceration(s) every visit Provide education on ulcer and skin care Treatment Activities: Skin care regimen initiated : 05/07/2020 Topical wound management initiated : 05/07/2020 Notes: Electronic Signature(s) Signed: 06/27/2020 4:56:28 PM By: Kela Millin Entered By: Kela Millin on 06/27/2020 14:20:16 -------------------------------------------------------------------------------- Negative Pressure Wound Therapy Maintenance (NPWT) Details Patient Name: Date of Service: Leonard Bentley 06/27/2020 2:00 PM Medical Record Number: [id number] Patient Account Number: [id number] Date of Birth/Sex: Treating RN: 05-Apr-1961 (59 y.o. [name] Primary Care Charidy Cappelletti: [name] Other Clinician: Referring Aracelie Addis: Treating Crespin Forstrom/Extender: Tomasa Blase, Paulette Blanch in Treatment: 7 NPWT Maintenance Performed for: Wound #1 Left Amputation Site - Transmetatarsal Performed By: Kela Millin, RN Type: VAC System Coverage Size (sq cm): 5.6 Pressure Type: Constant Pressure Setting: 125 mmHG Drain Type: Sponge/Dressing Type: Foam, Black Date Initiated: 05/20/2020 Dressing Removed: Yes Quantity of Sponges/Gauze Removed: 1 Canister Changed: No Dressing Reapplied: Yes Quantity of Sponges/Gauze Inserted: 1 Days On NPWT : 39 Post Procedure Diagnosis Same as Pre-procedure Electronic Signature(s) Signed: 06/27/2020 4:56:28 PM By: Kela Millin Entered By: Kela Millin on 06/27/2020 15:23:31 -------------------------------------------------------------------------------- Pain Assessment Details Patient Name: Date of Service: Leonard Amass  E A. 06/27/2020 2:00 PM Medical Record Number: [id number] Patient Account Number: [id number] Date of  Birth/Sex: Treating RN: 02/22/1961 (59 y.o. [name] Primary Care Breasia Karges: [name] Other Clinician: Referring Jennine Peddy: Treating Zakkary Thibault/Extender: [name] in Treatment: 7 Active Problems Location of Pain Severity and Description of Pain Patient Has Paino Yes Site Locations Rate the pain. Current Pain Level: 2 Pain Management and Medication Current Pain Management: Electronic Signature(s) Signed: 06/27/2020 4:56:28 PM By: Kela Millin Signed: 06/30/2020 2:53:33 PM By: Sandre Kitty Entered By: Sandre Kitty on 06/27/2020 14:22:11 -------------------------------------------------------------------------------- Patient/Caregiver Education Details Patient Name: Date of Service: Leonard Bentley 8/20/2021andnbsp2:00 PM Medical Record Number: [id number] Patient Account Number: [id number] Date of Birth/Gender: Treating RN: Nov 21, 1960 (59 y.o. [name] Primary Care Physician: [name] Other Clinician: Referring Physician: Treating Physician/Extender: [name] in Treatment: 7 Education Assessment Education Provided To: Patient Education Topics Provided Wound Debridement: Wound/Skin Impairment: Handouts: Caring for Your Ulcer Methods: Explain/Verbal Responses: State content correctly Electronic Signature(s) Signed: 06/27/2020 4:56:28 PM By: Kela Millin Entered By: Kela Millin on 06/27/2020 15:15:39 -------------------------------------------------------------------------------- Wound Assessment Details Patient Name: Date of Service: Leonard Dunning A. 06/27/2020 2:00 PM Medical Record Number: [id number] Patient Account Number: [id number] Date of Birth/Sex: Treating RN: 10-25-1961 (59 y.o. [name] Primary Care Marcelia Petersen: [name] Other Clinician: Referring Kodi Steil: Treating Audrionna Lampton/Extender: Tomasa Blase, Paulette Blanch in Treatment:  7 Wound Status Wound Number: 1 Primary Dehisced Wound Etiology: Wound Location: Left Amputation Site - Transmetatarsal Wound Open Wounding Event: Surgical Injury Status: Date Acquired: 03/21/2020 Comorbid Anemia, Arrhythmia, Congestive Heart Failure, Hypertension, Weeks Of Treatment: 7 History: Osteoarthritis, Osteomyelitis, Neuropathy, Received Chemotherapy Clustered Wound: No Photos Photo Uploaded By: Mikeal Hawthorne on 06/30/2020 12:12:08 Wound Measurements Length: (cm) 1.4 Width: (cm) 4 Depth: (cm) 0.7 Area: (cm) 4.398 Volume: (cm) 3.079 % Reduction in Area: 87.7% % Reduction in Volume: 97.5% Epithelialization: Medium (34-66%) Tunneling: No Undermining: No Wound Description Classification: Full Thickness With Exposed Support Structures Wound Margin: Flat and Intact Exudate Amount: Medium Exudate Type: Serosanguineous Exudate Color: red, brown Foul Odor After Cleansing: No Slough/Fibrino Yes Wound Bed Granulation Amount: Large (67-100%) Exposed Structure Granulation Quality: Red, Pink Fascia Exposed: No Necrotic Amount: Small (1-33%) Fat Layer (Subcutaneous Tissue) Exposed: Yes Necrotic Quality: Adherent Slough Tendon Exposed: No Muscle Exposed: No Joint Exposed: No Bone Exposed: No Treatment Notes Wound #1 (Left Amputation Site - Transmetatarsal) 1. Cleanse With Wound Cleanser 2. Periwound Care Skin Prep 3. Primary Dressing Applied Collegen [employer] 4. Secondary Dressing ABD Pad Other secondary dressing (specify in notes) 6. Support Layer Psychologist, forensic Notes [employer] wound vac , 1 piece black foam inserted, pressure set at 125 mmHg with good seal Electronic Signature(s) Signed: 06/27/2020 4:56:28 PM By: Kela Millin Entered By: Kela Millin on 06/27/2020 15:14:55 -------------------------------------------------------------------------------- Vitals Details Patient Name: Date of Service: Leonard Dunning A. 06/27/2020 2:00 PM Medical Record Number:  [id number] Patient Account Number: [id number] Date of Birth/Sex: Treating RN: 1961/04/13 (59 y.o. [name] Primary Care Travoris Bushey: [name] Other Clinician: Referring Mariyanna Mucha: Treating Korde Jeppsen/Extender: [name] in Treatment: 7 Vital Signs Time Taken: 14:20 Temperature (F): 97.7 Height (in): 69 Pulse (bpm): 66 Weight (lbs): 190 Respiratory Rate (breaths/min): 18 Body Mass Index (BMI): 28.1 Blood Pressure (mmHg): 152/87 Reference Range: 80 - 120 mg / dl Electronic Signature(s) Signed: 06/30/2020 2:53:33 PM By: Sandre Kitty Entered By: Sandre Kitty on 06/27/2020 14:22:03

## 2020-07-02 ENCOUNTER — Other Ambulatory Visit: Payer: Self-pay

## 2020-07-02 ENCOUNTER — Ambulatory Visit: Payer: Self-pay | Attending: Nurse Practitioner | Admitting: Pharmacist

## 2020-07-02 DIAGNOSIS — Z9889 Other specified postprocedural states: Secondary | ICD-10-CM

## 2020-07-02 DIAGNOSIS — Z86711 Personal history of pulmonary embolism: Secondary | ICD-10-CM

## 2020-07-02 LAB — POCT INR: INR: 2.3 (ref 2.0–3.0)

## 2020-07-03 NOTE — Progress Notes (Signed)
[name], [name] ([id number]) Visit Report for 07/01/2020 SuperBill Details Patient Name: Date of Service: Leonard Bentley 07/01/2020 Medical Record Number: [id number] Patient Account Number: [id number] Date of Birth/Sex: Treating RN: 30-Dec-1960 (59 y.o. [name] Primary Care Provider: [name] Other Clinician: Referring Provider: Treating Provider/Extender: [name] in Treatment: 7 Diagnosis Coding ICD-10 Codes Code Description [phone] Unspecified open wound, left foot, initial encounter T81.31XA Disruption of external operation (surgical) wound, not elsewhere classified, initial encounter F17.218 Nicotine dependence, cigarettes, with other nicotine-induced disorders Facility Procedures CPT4 Code Description Modifier Quantity [id number] - WOUND VAC-50 SQ CM OR LESS 1 Electronic Signature(s) Signed: 07/01/2020 4:55:12 PM By: Kela Millin Signed: 07/03/2020 6:08:30 PM By: Linton Ham MD Entered By: Kela Millin on 07/01/2020 12:43:46

## 2020-07-04 ENCOUNTER — Other Ambulatory Visit: Payer: Self-pay

## 2020-07-04 ENCOUNTER — Encounter (HOSPITAL_BASED_OUTPATIENT_CLINIC_OR_DEPARTMENT_OTHER): Payer: Medicaid Other | Admitting: Internal Medicine

## 2020-07-04 DIAGNOSIS — S91302A Unspecified open wound, left foot, initial encounter: Secondary | ICD-10-CM | POA: Diagnosis not present

## 2020-07-05 NOTE — Progress Notes (Signed)
MELITON, SAMAD ([id number]) Visit Report for 06/20/2020 Arrival Information Details Patient Name: Date of Service: Leonard Bentley 06/20/2020 12:45 PM Medical Record Number: [id number] Patient Account Number: [id number] Date of Birth/Sex: Treating RN: Dec 29, 1960 (59 y.o. Marvis Bentley Primary Care Kaja Jackowski: [name] Other Clinician: Referring Kailei Cowens: Treating Ameah Chanda/Extender: Marcos Eke in Treatment: 6 Visit Information History Since Last Visit Added or deleted any medications: No Patient Arrived: [name] Any new allergies or adverse reactions: No Arrival Time: 12:55 Had a fall or experienced change in No Accompanied By: self activities of daily living that may affect Transfer Assistance: None risk of falls: Patient Identification Verified: Yes Signs or symptoms of abuse/neglect since last visito No Secondary Verification Process Completed: Yes Hospitalized since last visit: No Patient Requires Transmission-Based Precautions: No Implantable device outside of the clinic excluding No Patient Has Alerts: Yes cellular tissue based products placed in the center Patient Alerts: Patient on Blood Thinner since last visit: L ABI: 1.03 (01/2020) Has Dressing in Place as Prescribed: Yes Pain Present Now: No Electronic Signature(s) Signed: 06/20/2020 1:13:21 PM By: Sandre Kitty Entered By: Sandre Kitty on 06/20/2020 12:56:05 -------------------------------------------------------------------------------- Encounter Discharge Information Details Patient Name: Date of Service: Leonard Dunning A. 06/20/2020 12:45 PM Medical Record Number: [id number] Patient Account Number: [id number] Date of Birth/Sex: Treating RN: [birth year] (60 y.o. Janyth Contes Primary Care Ajah Vanhoose: [name] Other Clinician: Referring Soha Thorup: Treating Myeshia Fojtik/Extender: Marcos Eke in Treatment: 6 Encounter Discharge Information  Items Discharge Condition: Stable Ambulatory Status: Walker Discharge Destination: Home Transportation: Private Auto Accompanied By: alone Schedule Follow-up Appointment: Yes Clinical Summary of Care: Patient Declined Electronic Signature(s) Signed: 06/20/2020 5:19:54 PM By: Levan Hurst RN, BSN Entered By: Levan Hurst on 06/20/2020 16:09:38 -------------------------------------------------------------------------------- Lower Extremity Assessment Details Patient Name: Date of Service: Leonard Dunning A. 06/20/2020 12:45 PM Medical Record Number: [id number] Patient Account Number: [id number] Date of Birth/Sex: Treating RN: 1961-01-20 (59 y.o. Oval Linsey Primary Care Armya Westerhoff: [name] Other Clinician: Referring Kaileia Flow: Treating Ellin Fitzgibbons/Extender: Azucena Kuba Weeks in Treatment: 6 Edema Assessment Assessed: [Left: No] [Right: No] Edema: [Left: Ye] [Right: s] Calf Left: Right: Point of Measurement: 31 cm From Medial Instep 37 cm cm Ankle Left: Right: Point of Measurement: 11 cm From Medial Instep 27 cm cm Electronic Signature(s) Signed: 07/04/2020 5:50:16 PM By: Carlene Coria RN Entered By: Carlene Coria on 06/20/2020 13:12:14 -------------------------------------------------------------------------------- [name] Details Patient Name: Date of Service: Leonard Dunning A. 06/20/2020 12:45 PM Medical Record Number: [id number] Patient Account Number: [id number] Date of Birth/Sex: Treating RN: [birth year] (59 y.o. Marvis Bentley Primary Care Brecken Dewoody: [name] Other Clinician: Referring Tykwon Fera: Treating Dalylah Ramey/Extender: Azucena Kuba Weeks in Treatment: 6 Active Inactive Abuse / Safety / Falls / Self Care Management Nursing Diagnoses: Potential for falls Goals: Patient/caregiver will verbalize/demonstrate measures taken to prevent injury and/or falls Date Initiated: 05/07/2020 Target  Resolution Date: 07/04/2020 Goal Status: Active Interventions: Assess fall risk on admission and as needed Assess impairment of mobility on admission and as needed per policy Notes: Necrotic Tissue Nursing Diagnoses: Impaired tissue integrity related to necrotic/devitalized tissue Knowledge deficit related to management of necrotic/devitalized tissue Goals: Necrotic/devitalized tissue will be minimized in the wound bed Date Initiated: 05/07/2020 Target Resolution Date: 07/04/2020 Goal Status: Active Patient/caregiver will verbalize understanding of reason and process for debridement of necrotic tissue Date Initiated: 05/07/2020 Target Resolution Date: 07/04/2020 Goal Status: Active Interventions: Assess patient pain level pre-, during  and post procedure and prior to discharge Provide education on necrotic tissue and debridement process Treatment Activities: Apply topical anesthetic as ordered : 05/07/2020 Biologic debridement : 05/07/2020 Excisional debridement : 05/07/2020 Notes: Wound/Skin Impairment Nursing Diagnoses: Impaired tissue integrity Knowledge deficit related to smoking impact on wound healing Knowledge deficit related to ulceration/compromised skin integrity Goals: Patient will demonstrate a reduced rate of smoking or cessation of smoking Date Initiated: 05/07/2020 Target Resolution Date: 06/04/2020 Goal Status: Active Patient/caregiver will verbalize understanding of skin care regimen Date Initiated: 05/07/2020 Target Resolution Date: 06/04/2020 Goal Status: Active Ulcer/skin breakdown will have a volume reduction of 30% by week 4 Date Initiated: 05/07/2020 Target Resolution Date: 06/04/2020 Goal Status: Active Interventions: Assess patient/caregiver ability to obtain necessary supplies Assess patient/caregiver ability to perform ulcer/skin care regimen upon admission and as needed Assess ulceration(s) every visit Provide education on ulcer and skin care Treatment  Activities: Skin care regimen initiated : 05/07/2020 Topical wound management initiated : 05/07/2020 Notes: Electronic Signature(s) Signed: 06/20/2020 4:53:28 PM By: Kela Millin Entered By: Kela Millin on 06/20/2020 13:27:18 -------------------------------------------------------------------------------- Negative Pressure Wound Therapy Maintenance (NPWT) Details Patient Name: Date of Service: Leonard Bentley 06/20/2020 12:45 PM Medical Record Number: [id number] Patient Account Number: [id number] Date of Birth/Sex: Treating RN: 13-Jun-1961 (59 y.o. Marvis Bentley Primary Care Myriah Boggus: [name] Other Clinician: Referring Delrick Dehart: Treating Zulma Court/Extender: Azucena Kuba Weeks in Treatment: 6 NPWT Maintenance Performed for: Wound #1 Left Amputation Site - Transmetatarsal Performed By: Kela Millin, RN Type: VAC System Coverage Size (sq cm): 5.95 Pressure Type: Constant Pressure Setting: 125 mmHG Drain Type: Primary Contact: Other : Sponge/Dressing Type: Foam, Black Date Initiated: 05/20/2020 Dressing Removed: Yes Quantity of Sponges/Gauze Removed: 1 Canister Changed: No Dressing Reapplied: Yes Quantity of Sponges/Gauze Inserted: 1 Days On NPWT : 32 Post Procedure Diagnosis Same as Pre-procedure Electronic Signature(s) Signed: 06/20/2020 4:53:28 PM By: Kela Millin Entered By: Kela Millin on 06/20/2020 13:28:30 -------------------------------------------------------------------------------- Pain Assessment Details Patient Name: Date of Service: Leonard Bentley 06/20/2020 12:45 PM Medical Record Number: [id number] Patient Account Number: [id number] Date of Birth/Sex: Treating RN: 02-17-1961 (59 y.o. Marvis Bentley Primary Care Taneia Mealor: [name] Other Clinician: Referring Adalina Dopson: Treating Rett Stehlik/Extender: Azucena Kuba Weeks in Treatment: 6 Active Problems Location of Pain Severity  and Description of Pain Patient Has Paino No Site Locations Pain Management and Medication Current Pain Management: Electronic Signature(s) Signed: 06/20/2020 1:13:21 PM By: Sandre Kitty Signed: 06/20/2020 4:53:28 PM By: Kela Millin Entered By: Sandre Kitty on 06/20/2020 13:01:03 -------------------------------------------------------------------------------- Patient/Caregiver Education Details Patient Name: Date of Service: Leonard Bentley 8/13/2021andnbsp12:45 PM Medical Record Number: [id number] Patient Account Number: [id number] Date of Birth/Gender: Treating RN: [birth year] (59 y.o. Marvis Bentley Primary Care Physician: [name] Other Clinician: Referring Physician: Treating Physician/Extender: Marcos Eke in Treatment: 6 Education Assessment Education Provided To: Patient Education Topics Provided Wound Debridement: Wound/Skin Impairment: Handouts: Caring for Your Ulcer Methods: Explain/Verbal Responses: State content correctly Electronic Signature(s) Signed: 06/20/2020 4:53:28 PM By: Kela Millin Entered By: Kela Millin on 06/20/2020 13:27:40 -------------------------------------------------------------------------------- Wound Assessment Details Patient Name: Date of Service: Leonard Dunning A. 06/20/2020 12:45 PM Medical Record Number: [id number] Patient Account Number: [id number] Date of Birth/Sex: Treating RN: Jan 18, 1961 (59 y.o. Marvis Bentley Primary Care Bina Veenstra: [name] Other Clinician: Referring Taziah Difatta: Treating Kanisha Duba/Extender: Azucena Kuba Weeks in Treatment: 6 Wound Status Wound Number: 1 Primary Dehisced Wound Etiology: Wound Location: Left Amputation Site - Transmetatarsal Wound  Open Wounding Event: Surgical Injury Status: Date Acquired: 03/21/2020 Comorbid Anemia, Arrhythmia, Congestive Heart Failure, Hypertension, Weeks Of Treatment: 6 History:  Osteoarthritis, Osteomyelitis, Neuropathy, Received Clustered Wound: No Chemotherapy Photos Photo Uploaded By: Mikeal Hawthorne on 06/23/2020 11:27:09 Wound Measurements Length: (cm) 1.7 Width: (cm) 3.5 Depth: (cm) 0.7 Area: (cm) 4.673 Volume: (cm) 3.271 % Reduction in Area: 86.9% % Reduction in Volume: 97.4% Epithelialization: None Tunneling: No Undermining: No Wound Description Classification: Full Thickness With Exposed Support Structures Exudate Amount: Medium Exudate Type: Serosanguineous Exudate Color: red, brown Foul Odor After Cleansing: No Slough/Fibrino Yes Wound Bed Granulation Amount: Large (67-100%) Exposed Structure Granulation Quality: Red, Pink Fascia Exposed: No Necrotic Amount: Small (1-33%) Fat Layer (Subcutaneous Tissue) Exposed: Yes Necrotic Quality: Adherent Slough Tendon Exposed: No Muscle Exposed: No Joint Exposed: No Bone Exposed: No Electronic Signature(s) Signed: 06/20/2020 4:53:28 PM By: Kela Millin Signed: 07/04/2020 5:50:16 PM By: Carlene Coria RN Entered By: Carlene Coria on 06/20/2020 13:13:04 -------------------------------------------------------------------------------- Vitals Details Patient Name: Date of Service: Leonard Dunning A. 06/20/2020 12:45 PM Medical Record Number: [id number] Patient Account Number: [id number] Date of Birth/Sex: Treating RN: Jul 17, 1961 (59 y.o. Marvis Bentley Primary Care Rayford Williamsen: [name] Other Clinician: Referring Acelin Ferdig: Treating Tiffanni Scarfo/Extender: Azucena Kuba Weeks in Treatment: 6 Vital Signs Time Taken: 12:56 Temperature (F): 97.6 Height (in): 69 Pulse (bpm): 67 Weight (lbs): 190 Respiratory Rate (breaths/min): 18 Body Mass Index (BMI): 28.1 Blood Pressure (mmHg): 123/92 Reference Range: 80 - 120 mg / dl Electronic Signature(s) Signed: 06/20/2020 1:13:21 PM By: Sandre Kitty Entered By: Sandre Kitty on 06/20/2020 13:00:55

## 2020-07-06 NOTE — Progress Notes (Signed)
S[place]O, [place] ([id number]) Visit Report for 07/04/2020 HPI Details Patient Name: Date of Service: [name] 07/04/2020 1:45 PM Medical Record Number: [id number] Patient Account Number: [id number] Date of Birth/Sex: Treating RN: 06/19/1961 (59 y.o. [name] Primary Care Provider: [name] Other Clinician: Referring Provider: Treating Provider/Extender: [name] in Treatment: 8 History of Present Illness HPI Description: 05/07/2020 upon evaluation today patient appears to be doing somewhat poorly in regard to a surgery site on his left foot which is a transmetatarsal amputation site. He actually had his second and third toes removed in March 2021. Unfortunately that did not heal appropriately and so he subsequently in May he had a transmetatarsal amputation performed by Dr. [name]. Subsequently this unfortunately also dehisced and though he still has sutures in place this has been about 6 weeks out and unfortunately the sutures are not holding anything together there is necrotic tissue that the sutures are actually embedded into and overall this really needs to be cleaned out and to be honest I think the patient really needs a wound VAC. With that being said there does not fortunately appear to be any signs of infection which is good news. Nonetheless I do believe this is definitely going to be a significant time to healing even with the wound VAC in order to get this completely closed. There is a lot of depth to the wound through the necrotic tissue and again a lot of the necrotic tissue needs to be removed we may be able to do some of that today I am not thinking we will get everything cleaned out however. No fevers, chills, nausea, vomiting, or diarrhea. The patient does have a history of nicotine dependence and we did discuss that in detail today but other than that he really has no major medical problems at this point other than what is going on  with his feet. In fact it really has not even been a determination as to why he is developing the wounds and having trouble healing his blood flow is good he does however smoke which could be affecting things in my opinion. 05/14/20 upon evaluation today patient appears to be doing better in my opinion. Overall the appearance of the wound is improved and I am very pleased with that. He has a tremendous amount of the necrotic tissue that between debridement last week, the use of Anasept in the interim, and debridement today has cleared away and again though he does have some depth this appears to be looking much more healthy. Overall I think a wound VAC would still do excellent for him even more so at this point. 7/16; patient I am seeing for the first time today. Considerable improvement in the surgical wound of the left TMA site. 4 days on a wound VAC. The majority of this is healthy granulated tissue. Some surface slough. Looking back through the pictures there is really been a tremendous improvement since his arrival in the clinic. He does not have a macrovascular issue. 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 . Silver collagen under the VAC. He is making nice progress 06/06/2020 on evaluation today patient actually appears to be doing quite well with regard to his wound. He is making good progress and very pleased in that regard. There is no signs of active infection at this time which is also good news. He seems to be tolerating the wound VAC without any  complication and I am seeing excellent improvement especially compared to where things started when I first saw him here in the clinic. He is very pleased with where things stand. 06/13/2020 on evaluation today patient's wound actually showed signs of excellent improvement. I am actually very pleased with where things stand and he is progressing quite nicely. 06/20/20-Patient comes today with left  transmetatarsal amputation wound looking good, he did notice a small suture that popped up on the medial edge of the wound which was removed today 8/20; left transmetatarsal amputation site this looks quite a bit better than when I last saw this. He has 2 linear areas with some depth and minimal undermining. The rest of this is superficial. Everything looks healthy. He is offloading this with the knees crutch and a walker 8/27; left transmetatarsal amputation site. This continues to improve the linear areas of filled in nice healthy red granulation tissue. I see no need for continuing the wound VAC at this point. The patient has Medicaid pending therefore were going to have to be careful about what dressings we use going forward Electronic Signature(s) Signed: 07/06/2020 7:39:18 AM By: Linton Ham MD Entered By: Linton Ham on 07/04/2020 15:33:40 -------------------------------------------------------------------------------- Physical Exam Details Patient Name: Date of Service: [name] 07/04/2020 1:45 PM Medical Record Number: [id number] Patient Account Number: [id number] Date of Birth/Sex: Treating RN: 17-Feb-1961 (59 y.o. [name] Primary Care Provider: [name] Other Clinician: Referring Provider: Treating Provider/Extender: Tomasa Blase, Paulette Blanch in Treatment: 8 Constitutional Sitting or standing Blood Pressure is within target range for patient.. Pulse regular and within target range for patient.Marland Kitchen Respirations regular, non-labored and within target range.. Temperature is normal and within the target range for the patient.Marland Kitchen Appears in no distress. Cardiovascular Pedal pulses are robust. Notes Wound exam; left transmetatarsal amputation site. The linear areas that he had that was concerning to me last time I think of largely filled in. He has raised edges inferiorly but superiorly he has nice progressing epithelialization. There is no evidence  of infection. Electronic Signature(s) Signed: 07/06/2020 7:39:18 AM By: Linton Ham MD Entered By: Linton Ham on 07/04/2020 15:36:15 -------------------------------------------------------------------------------- Physician Orders Details Patient Name: Date of Service: [name] 07/04/2020 1:45 PM Medical Record Number: [id number] Patient Account Number: [id number] Date of Birth/Sex: Treating RN: September 17, 1961 (59 y.o. [name] Primary Care Provider: [name] Other Clinician: Referring Provider: Treating Provider/Extender: [name] in Treatment: 8 Verbal / Phone Orders: No Diagnosis Coding ICD-10 Coding Code Description S91.302A Unspecified open wound, left foot, initial encounter T81.31XA Disruption of external operation (surgical) wound, not elsewhere classified, initial encounter F17.218 Nicotine dependence, cigarettes, with other nicotine-induced disorders Follow-up Appointments Return Appointment in 1 week. Dressing Change Frequency Wound #1 Left Amputation Site - Transmetatarsal Change Dressing every other [place]. Wound Cleansing Wound #1 Left Amputation Site - Transmetatarsal Clean wound with Wound Cleanser Primary Wound Dressing Wound #1 Left Amputation Site - Transmetatarsal Hydrofera Blue Other: - wound vac on hold right now Secondary Dressing Wound #1 Left Amputation Site - Transmetatarsal Kerlix/Rolled Gauze ABD pad Other: - ace wrap Negative Presssure Wound Therapy Wound Vac to wound continuously at 122mm/hg pressure - on hold right now Edema Control Avoid standing for long periods of time Elevate legs to the level of the heart or above for 30 minutes daily and/or when sitting, a frequency of: Off-Loading Other: - minimal weight bearing left foot Electronic Signature(s) Signed: 07/04/2020 5:56:33 PM By: Kela Millin Signed:  07/06/2020 7:39:18 AM By: Linton Ham MD Entered By: Kela Millin on  07/04/2020 14:43:47 -------------------------------------------------------------------------------- Problem List Details Patient Name: Date of Service: [name] 07/04/2020 1:45 PM Medical Record Number: [id number] Patient Account Number: [id number] Date of Birth/Sex: Treating RN: 08-20-1961 (58 y.o. [name] Primary Care Provider: [name] Other Clinician: Referring Provider: Treating Provider/Extender: [name] in Treatment: 8 Active Problems ICD-10 Encounter Code Description Active Date MDM Diagnosis S91.302A Unspecified open wound, left foot, initial encounter 05/07/2020 No Yes T81.31XA Disruption of external operation (surgical) wound, not elsewhere classified, 05/07/2020 No Yes initial encounter F17.218 Nicotine dependence, cigarettes, with other nicotine-induced disorders 05/07/2020 No Yes Inactive Problems Resolved Problems Electronic Signature(s) Signed: 07/06/2020 7:39:18 AM By: Linton Ham MD Entered By: Linton Ham on 07/04/2020 15:32:53 -------------------------------------------------------------------------------- Progress Note Details Patient Name: Date of Service: [name] 07/04/2020 1:45 PM Medical Record Number: [id number] Patient Account Number: [id number] Date of Birth/Sex: Treating RN: 1961-03-05 (59 y.o. [name] Primary Care Provider: [name] Other Clinician: Referring Provider: Treating Provider/Extender: Tomasa Blase, Paulette Blanch in Treatment: 8 Subjective History of Present Illness (HPI) 05/07/2020 upon evaluation today patient appears to be doing somewhat poorly in regard to a surgery site on his left foot which is a transmetatarsal amputation site. He actually had his second and third toes removed in March 2021. Unfortunately that did not heal appropriately and so he subsequently in May he had a transmetatarsal amputation performed by Dr. [name]. Subsequently  this unfortunately also dehisced and though he still has sutures in place this has been about 6 weeks out and unfortunately the sutures are not holding anything together there is necrotic tissue that the sutures are actually embedded into and overall this really needs to be cleaned out and to be honest I think the patient really needs a wound VAC. With that being said there does not fortunately appear to be any signs of infection which is good news. Nonetheless I do believe this is definitely going to be a significant time to healing even with the wound VAC in order to get this completely closed. There is a lot of depth to the wound through the necrotic tissue and again a lot of the necrotic tissue needs to be removed we may be able to do some of that today I am not thinking we will get everything cleaned out however. No fevers, chills, nausea, vomiting, or diarrhea. The patient does have a history of nicotine dependence and we did discuss that in detail today but other than that he really has no major medical problems at this point other than what is going on with his feet. In fact it really has not even been a determination as to why he is developing the wounds and having trouble healing his blood flow is good he does however smoke which could be affecting things in my opinion. 05/14/20 upon evaluation today patient appears to be doing better in my opinion. Overall the appearance of the wound is improved and I am very pleased with that. He has a tremendous amount of the necrotic tissue that between debridement last week, the use of Anasept in the interim, and debridement today has cleared away and again though he does have some depth this appears to be looking much more healthy. Overall I think a wound VAC would still do excellent for him even more so at this point. 7/16; patient I am seeing for the first time today. Considerable  improvement in the surgical wound of the left TMA site. 4 days on a wound  VAC. The majority of this is healthy granulated tissue. Some surface slough. Looking back through the pictures there is really been a tremendous improvement since his arrival in the clinic. He does not have a macrovascular issue. 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 . Silver collagen under the VAC. He is making nice progress 06/06/2020 on evaluation today patient actually appears to be doing quite well with regard to his wound. He is making good progress and very pleased in that regard. There is no signs of active infection at this time which is also good news. He seems to be tolerating the wound VAC without any complication and I am seeing excellent improvement especially compared to where things started when I first saw him here in the clinic. He is very pleased with where things stand. 06/13/2020 on evaluation today patient's wound actually showed signs of excellent improvement. I am actually very pleased with where things stand and he is progressing quite nicely. 06/20/20-Patient comes today with left transmetatarsal amputation wound looking good, he did notice a small suture that popped up on the medial edge of the wound which was removed today 8/20; left transmetatarsal amputation site this looks quite a bit better than when I last saw this. He has 2 linear areas with some depth and minimal undermining. The rest of this is superficial. Everything looks healthy. He is offloading this with the knees crutch and a walker 8/27; left transmetatarsal amputation site. This continues to improve the linear areas of filled in nice healthy red granulation tissue. I see no need for continuing the wound VAC at this point. The patient has Medicaid pending therefore were going to have to be careful about what dressings we use going forward Objective Constitutional Sitting or standing Blood Pressure is within target range for patient.. Pulse regular and within  target range for patient.Marland Kitchen Respirations regular, non-labored and within target range.. Temperature is normal and within the target range for the patient.Marland Kitchen Appears in no distress. Vitals Time Taken: 2:24 PM, Height: 69 in, Weight: 190 lbs, BMI: 28.1, Temperature: 98 F, Pulse: 81 bpm, Respiratory Rate: 18 breaths/min, Blood Pressure: 128/87 mmHg. Cardiovascular Pedal pulses are robust. General Notes: Wound exam; left transmetatarsal amputation site. The linear areas that he had that was concerning to me last time I think of largely filled in. He has raised edges inferiorly but superiorly he has nice progressing epithelialization. There is no evidence of infection. Integumentary (Hair, Skin) Wound #1 status is Open. Original cause of wound was Surgical Injury. The wound is located on the Left Amputation Site - Transmetatarsal. The wound measures 1.7cm length x 3cm width x 0.3cm depth; 4.006cm^2 area and 1.202cm^3 volume. There is Fat Layer (Subcutaneous Tissue) exposed. There is no tunneling or undermining noted. There is a medium amount of serosanguineous drainage noted. There is large (67-100%) red granulation within the wound bed. There is no necrotic tissue within the wound bed. Assessment Active Problems ICD-10 Unspecified open wound, left foot, initial encounter Disruption of external operation (surgical) wound, not elsewhere classified, initial encounter Nicotine dependence, cigarettes, with other nicotine-induced disorders Plan Follow-up Appointments: Return Appointment in 1 week. Dressing Change Frequency: Wound #1 Left Amputation Site - Transmetatarsal: Change Dressing every other [place]. Wound Cleansing: Wound #1 Left Amputation Site - Transmetatarsal: Clean wound with Wound Cleanser Primary Wound Dressing: Wound #1 Left Amputation Site -  Transmetatarsal: Hydrofera Blue Other: - wound vac on hold right now Secondary Dressing: Wound #1 Left Amputation Site -  Transmetatarsal: Kerlix/Rolled Gauze ABD pad Other: - ace wrap Negative Presssure Wound Therapy: Wound Vac to wound continuously at 125mm/hg pressure - on hold right now Edema Control: Avoid standing for long periods of time Elevate legs to the level of the heart or above for 30 minutes daily and/or when sitting, a frequency of: Off-Loading: Other: - minimal weight bearing left foot #1 I think the wound VAC can be discontinued 2. In view of the fact that [facility] comes in larger pieces will use Hydrofera Blue. This should allow the patient to change this every second [place] Electronic Signature(s) Signed: 07/06/2020 7:39:18 AM By: Linton Ham MD Entered By: Linton Ham on 07/04/2020 15:37:05 -------------------------------------------------------------------------------- SuperBill Details Patient Name: Date of Service: [name] 07/04/2020 Medical Record Number: [id number] Patient Account Number: [id number] Date of Birth/Sex: Treating RN: Aug 16, 1961 (59 y.o. [name] Primary Care Provider: [name] Other Clinician: Referring Provider: Treating Provider/Extender: [name] in Treatment: 8 Diagnosis Coding ICD-10 Codes Code Description [phone] Unspecified open wound, left foot, initial encounter T81.31XA Disruption of external operation (surgical) wound, not elsewhere classified, initial encounter F17.218 Nicotine dependence, cigarettes, with other nicotine-induced disorders Facility Procedures CPT4 Code: [id number] Description: 99213 - WOUND CARE VISIT-LEV 3 EST PT Modifier: Quantity: 1 Physician Procedures : CPT4 Code Description Modifier [id number] - WC PHYS LEVEL 3 - EST PT ICD-10 Diagnosis Description Y24.825O Unspecified open wound, left foot, initial encounter T81.31XA Disruption of external operation (surgical) wound, not elsewhere classified,  initial encounter Quantity: 1 Electronic Signature(s) Signed:  07/06/2020 7:39:18 AM By: Linton Ham MD Entered By: Linton Ham on 07/04/2020 15:37:26

## 2020-07-06 NOTE — Progress Notes (Signed)
NIRVAAN, FRETT ([id number]) Visit Report for 07/04/2020 Arrival Information Details Patient Name: Date of Service: Leonard Bentley 07/04/2020 1:45 PM Medical Record Number: [id number] Patient Account Number: [id number] Date of Birth/Sex: Treating RN: Mar 21, 1961 (59 y.o. Jerilynn Mages) Carlene Coria Primary Care Goldia Ligman: [name] Other Clinician: Referring Macaila Tahir: Treating Maxximus Gotay/Extender: [name] in Treatment: 8 Visit Information History Since Last Visit All ordered tests and consults were completed: No Patient Arrived: Stretcher Added or deleted any medications: No Arrival Time: 14:23 Any new allergies or adverse reactions: No Accompanied By: self Had a fall or experienced change in No Transfer Assistance: None activities of daily living that may affect Patient Identification Verified: Yes risk of falls: Secondary Verification Process Completed: Yes Signs or symptoms of abuse/neglect since last visito No Patient Requires Transmission-Based Precautions: No Hospitalized since last visit: No Patient Has Alerts: Yes Implantable device outside of the clinic excluding No Patient Alerts: Patient on Blood Thinner cellular tissue based products placed in the center L ABI: 1.03 (01/2020) since last visit: Has Dressing in Place as Prescribed: Yes Pain Present Now: No Electronic Signature(s) Signed: 07/04/2020 5:49:17 PM By: Carlene Coria RN Entered By: Carlene Coria on 07/04/2020 14:24:15 -------------------------------------------------------------------------------- Clinic Level of Care Assessment Details Patient Name: Date of Service: Leonard Bentley 07/04/2020 1:45 PM Medical Record Number: [id number] Patient Account Number: [id number] Date of Birth/Sex: Treating RN: 1961/02/17 (59 y.o. [name] Primary Care Carole Deere: [name] Other Clinician: Referring Almee Pelphrey: Treating Nijel Flink/Extender: [name] in Treatment:  8 Clinic Level of Care Assessment Items TOOL 4 Quantity Score X- 1 0 Use when only an EandM is performed on FOLLOW-UP visit ASSESSMENTS - Nursing Assessment / Reassessment X- 1 10 Reassessment of Co-morbidities (includes updates in patient status) X- 1 5 Reassessment of Adherence to Treatment Plan ASSESSMENTS - Wound and Skin A ssessment / Reassessment X - Simple Wound Assessment / Reassessment - one wound 1 5 []  - 0 Complex Wound Assessment / Reassessment - multiple wounds []  - 0 Dermatologic / Skin Assessment (not related to wound area) ASSESSMENTS - Focused Assessment X- 1 5 Circumferential Edema Measurements - multi extremities []  - 0 Nutritional Assessment / Counseling / Intervention []  - 0 Lower Extremity Assessment (monofilament, tuning fork, pulses) []  - 0 Peripheral Arterial Disease Assessment (using hand held doppler) ASSESSMENTS - Ostomy and/or Continence Assessment and Care []  - 0 Incontinence Assessment and Management []  - 0 Ostomy Care Assessment and Management (repouching, etc.) PROCESS - Coordination of Care X - Simple Patient / Family Education for ongoing care 1 15 []  - 0 Complex (extensive) Patient / Family Education for ongoing care X- 1 10 Staff obtains Programmer, systems, Records, T Results / Process Orders est []  - 0 Staff telephones HHA, Nursing Homes / Clarify orders / etc []  - 0 Routine Transfer to another Facility (non-emergent condition) []  - 0 Routine Hospital Admission (non-emergent condition) []  - 0 New Admissions / Biomedical engineer / Ordering NPWT Apligraf, etc. , []  - 0 Emergency Hospital Admission (emergent condition) X- 1 10 Simple Discharge Coordination []  - 0 Complex (extensive) Discharge Coordination PROCESS - Special Needs []  - 0 Pediatric / Minor Patient Management []  - 0 Isolation Patient Management []  - 0 Hearing / Language / Visual special needs []  - 0 Assessment of Community assistance (transportation, D/C planning,  etc.) []  - 0 Additional assistance / Altered mentation []  - 0 Support Surface(s) Assessment (bed, cushion, seat, etc.) INTERVENTIONS - Wound Cleansing / Measurement  X - Simple Wound Cleansing - one wound 1 5 $R'[]'Ee$  - 0 Complex Wound Cleansing - multiple wounds X- 1 5 Wound Imaging (photographs - any number of wounds) $RemoveBe'[]'rYBoUNkbB$  - 0 Wound Tracing (instead of photographs) X- 1 5 Simple Wound Measurement - one wound $RemoveB'[]'fFlKNNSY$  - 0 Complex Wound Measurement - multiple wounds INTERVENTIONS - Wound Dressings X - Small Wound Dressing one or multiple wounds 1 10 $Re'[]'XdH$  - 0 Medium Wound Dressing one or multiple wounds $RemoveBeforeD'[]'axoRBjvzKvUyoQ$  - 0 Large Wound Dressing one or multiple wounds X- 1 5 Application of Medications - topical $RemoveB'[]'YxNaPMUT$  - 0 Application of Medications - injection INTERVENTIONS - Miscellaneous $RemoveBeforeD'[]'LaFOTXFpPrsPCV$  - 0 External ear exam $Remove'[]'RtiykyY$  - 0 Specimen Collection (cultures, biopsies, blood, body fluids, etc.) $RemoveBefor'[]'qpIMKnhzRXKN$  - 0 Specimen(s) / Culture(s) sent or taken to Lab for analysis $RemoveBefo'[]'tEBjHzQBblW$  - 0 Patient Transfer (multiple staff / Civil Service fast streamer / Similar devices) $RemoveBeforeDE'[]'iNUzAUaCOliWkVK$  - 0 Simple Staple / Suture removal (25 or less) $Remove'[]'jPrENrj$  - 0 Complex Staple / Suture removal (26 or more) $Remove'[]'FHNpJtV$  - 0 Hypo / Hyperglycemic Management (close monitor of Blood Glucose) $RemoveBefore'[]'sYJWtamOsXOTK$  - 0 Ankle / Brachial Index (ABI) - do not check if billed separately X- 1 5 Vital Signs Has the patient been seen at the hospital within the last three years: Yes Total Score: 95 Level Of Care: New/Established - Level 3 Electronic Signature(s) Signed: 07/04/2020 5:56:33 PM By: Kela Millin Entered By: Kela Millin on 07/04/2020 14:49:14 -------------------------------------------------------------------------------- Encounter Discharge Information Details Patient Name: Date of Service: Leonard Dunning A. 07/04/2020 1:45 PM Medical Record Number: [id number] Patient Account Number: [id number] Date of Birth/Sex: Treating RN: 06-03-1961 (59 y.o. Oval Linsey Primary Care Braven Wolk: [name]  Other Clinician: Referring Mckaila Duffus: Treating Lorik Guo/Extender: [name] in Treatment: 8 Encounter Discharge Information Items Discharge Condition: Stable Ambulatory Status: Walker Discharge Destination: Home Transportation: Private Auto Accompanied By: self Schedule Follow-up Appointment: Yes Clinical Summary of Care: Patient Declined Electronic Signature(s) Signed: 07/04/2020 5:49:17 PM By: Carlene Coria RN Entered By: Carlene Coria on 07/04/2020 15:00:36 -------------------------------------------------------------------------------- Lower Extremity Assessment Details Patient Name: Date of Service: Leonard Bentley 07/04/2020 1:45 PM Medical Record Number: [id number] Patient Account Number: [id number] Date of Birth/Sex: Treating RN: Apr 10, 1961 (59 y.o. Oval Linsey Primary Care Brion Hedges: [name] Other Clinician: Referring Cedrica Brune: Treating Muaad Boehning/Extender: Tomasa Blase, Army Melia Weeks in Treatment: 8 Edema Assessment Assessed: [Left: No] [Right: No] Edema: [Left: Ye] [Right: s] Calf Left: Right: Point of Measurement: 31 cm From Medial Instep 37 cm cm Ankle Left: Right: Point of Measurement: 11 cm From Medial Instep 27 cm cm Electronic Signature(s) Signed: 07/04/2020 5:49:17 PM By: Carlene Coria RN Entered By: Carlene Coria on 07/04/2020 14:24:49 -------------------------------------------------------------------------------- Multi Wound Chart Details Patient Name: Date of Service: Leonard Dunning A. 07/04/2020 1:45 PM Medical Record Number: [id number] Patient Account Number: [id number] Date of Birth/Sex: Treating RN: 06/13/1961 (59 y.o. [name] Primary Care Izzabella Besse: [name] Other Clinician: Referring Jennifer Payes: Treating Keevon Henney/Extender: Tomasa Blase, Paulette Blanch in Treatment: 8 Vital Signs Height(in): 69 Pulse(bpm): 81 Weight(lbs): 190 Blood Pressure(mmHg): 128/87 Body Mass Index(BMI):  28 Temperature(F): 98 Respiratory Rate(breaths/min): 18 Photos: [1:No Photos Left Amputation Site - Transmetatarsal N/A] [N/A:N/A] Wound Location: [1:Surgical Injury] [N/A:N/A] Wounding Event: [1:Dehisced Wound] [N/A:N/A] Primary Etiology: [1:Anemia, Arrhythmia, Congestive Heart N/A] Comorbid History: [1:Failure, Hypertension, Osteoarthritis, Osteomyelitis, Neuropathy, Received Chemotherapy 03/21/2020] [N/A:N/A] Date Acquired: [1:8] [N/A:N/A] Weeks of Treatment: [1:Open] [N/A:N/A] Wound Status: [1:1.7x3x0.3] [N/A:N/A] Measurements L x W x D (cm) [1:4.006] [N/A:N/A]  A (cm) : rea [1:1.202] [N/A:N/A] Volume (cm) : [1:88.80%] [N/A:N/A] % Reduction in Area: [1:99.00%] [N/A:N/A] % Reduction in Volume: [1:Full Thickness With Exposed Support N/A] Classification: [1:Structures Medium] [N/A:N/A] Exudate Amount: [1:Serosanguineous] [N/A:N/A] Exudate Type: [1:red, brown] [N/A:N/A] Exudate Color: [1:Large (67-100%)] [N/A:N/A] Granulation Amount: [1:Red] [N/A:N/A] Granulation Quality: [1:None Present (0%)] [N/A:N/A] Necrotic Amount: [1:Fat Layer (Subcutaneous Tissue): Yes N/A] Exposed Structures: [1:Fascia: No Tendon: No Muscle: No Joint: No Bone: No] Treatment Notes Wound #1 (Left Amputation Site - Transmetatarsal) 1. Cleanse With Wound Cleanser 3. Primary Dressing Applied Hydrofera Blue 4. Secondary Dressing ABD Pad Dry Gauze Roll Gauze 5. Secured With Agricultural engineer) Signed: 07/04/2020 5:56:33 PM By: Kela Millin Signed: 07/06/2020 7:39:18 AM By: Linton Ham MD Entered By: Linton Ham on 07/04/2020 15:32:59 -------------------------------------------------------------------------------- Multi-Disciplinary Care Plan Details Patient Name: Date of Service: Leonard Dunning A. 07/04/2020 1:45 PM Medical Record Number: [id number] Patient Account Number: [id number] Date of Birth/Sex: Treating RN: 1961/04/29 (59 y.o. [name] Primary  Care Lailanie Hasley: [name] Other Clinician: Referring Yasmyn Bellisario: Treating Savon Bordonaro/Extender: Tomasa Blase, Paulette Blanch in Treatment: 8 Active Inactive Abuse / Safety / Falls / Self Care Management Nursing Diagnoses: Potential for falls Goals: Patient/caregiver will verbalize/demonstrate measures taken to prevent injury and/or falls Date Initiated: 05/07/2020 Target Resolution Date: 07/25/2020 Goal Status: Active Interventions: Assess fall risk on admission and as needed Assess impairment of mobility on admission and as needed per policy Notes: Wound/Skin Impairment Nursing Diagnoses: Impaired tissue integrity Knowledge deficit related to smoking impact on wound healing Knowledge deficit related to ulceration/compromised skin integrity Goals: Patient will demonstrate a reduced rate of smoking or cessation of smoking Date Initiated: 05/07/2020 Target Resolution Date: 07/25/2020 Goal Status: Active Patient/caregiver will verbalize understanding of skin care regimen Date Initiated: 05/07/2020 Target Resolution Date: 07/25/2020 Goal Status: Active Ulcer/skin breakdown will have a volume reduction of 30% by week 4 Date Initiated: 05/07/2020 Date Inactivated: 07/04/2020 Target Resolution Date: 07/04/2020 Goal Status: Met Interventions: Assess patient/caregiver ability to obtain necessary supplies Assess patient/caregiver ability to perform ulcer/skin care regimen upon admission and as needed Assess ulceration(s) every visit Provide education on ulcer and skin care Treatment Activities: Skin care regimen initiated : 05/07/2020 Topical wound management initiated : 05/07/2020 Notes: Electronic Signature(s) Signed: 07/04/2020 5:56:33 PM By: Kela Millin Signed: 07/04/2020 5:56:33 PM By: Kela Millin Entered By: Kela Millin on 07/04/2020 14:48:27 -------------------------------------------------------------------------------- Pain Assessment Details Patient  Name: Date of Service: Leonard Dunning A. 07/04/2020 1:45 PM Medical Record Number: [id number] Patient Account Number: [id number] Date of Birth/Sex: Treating RN: [birth year] (59 y.o. Oval Linsey Primary Care Jerney Baksh: [name] Other Clinician: Referring Michalle Rademaker: Treating Keeli Roberg/Extender: Tomasa Blase, Paulette Blanch in Treatment: 8 Active Problems Location of Pain Severity and Description of Pain Patient Has Paino No Site Locations Pain Management and Medication Current Pain Management: Electronic Signature(s) Signed: 07/04/2020 5:49:17 PM By: Carlene Coria RN Entered By: Carlene Coria on 07/04/2020 14:24:45 -------------------------------------------------------------------------------- Patient/Caregiver Education Details Patient Name: Date of Service: Leonard Bentley 8/27/2021andnbsp1:45 PM Medical Record Number: [id number] Patient Account Number: [id number] Date of Birth/Gender: Treating RN: 1961-05-24 (59 y.o. [name] Primary Care Physician: [name] Other Clinician: Referring Physician: Treating Physician/Extender: [name] in Treatment: 8 Education Assessment Education Provided To: Patient Education Topics Provided Wound/Skin Impairment: Handouts: Caring for Your Ulcer Methods: Explain/Verbal Responses: State content correctly Electronic Signature(s) Signed: 07/04/2020 5:56:33 PM By: Kela Millin Entered By: Kela Millin on 07/04/2020 14:48:40 -------------------------------------------------------------------------------- Wound Assessment Details  Patient Name: Date of Service: Leonard Bentley 07/04/2020 1:45 PM Medical Record Number: [id number] Patient Account Number: [id number] Date of Birth/Sex: Treating RN: 12-27-1960 (59 y.o. Jerilynn Mages) Carlene Coria Primary Care Kaleigh Spiegelman: [name] Other Clinician: Referring Haliyah Fryman: Treating Elham Fini/Extender: Tomasa Blase, Paulette Blanch in  Treatment: 8 Wound Status Wound Number: 1 Primary Dehisced Wound Etiology: Wound Location: Left Amputation Site - Transmetatarsal Wound Open Wounding Event: Surgical Injury Status: Date Acquired: 03/21/2020 Comorbid Anemia, Arrhythmia, Congestive Heart Failure, Hypertension, Weeks Of Treatment: 8 History: Osteoarthritis, Osteomyelitis, Neuropathy, Received Chemotherapy Clustered Wound: No Wound Measurements Length: (cm) 1.7 Width: (cm) 3 Depth: (cm) 0.3 Area: (cm) 4.006 Volume: (cm) 1.202 % Reduction in Area: 88.8% % Reduction in Volume: 99% Tunneling: No Undermining: No Wound Description Classification: Full Thickness With Exposed Support Structures Exudate Amount: Medium Exudate Type: Serosanguineous Exudate Color: red, brown Foul Odor After Cleansing: No Slough/Fibrino No Wound Bed Granulation Amount: Large (67-100%) Exposed Structure Granulation Quality: Red Fascia Exposed: No Necrotic Amount: None Present (0%) Fat Layer (Subcutaneous Tissue) Exposed: Yes Tendon Exposed: No Muscle Exposed: No Joint Exposed: No Bone Exposed: No Treatment Notes Wound #1 (Left Amputation Site - Transmetatarsal) 1. Cleanse With Wound Cleanser 3. Primary Dressing Applied Hydrofera Blue 4. Secondary Dressing ABD Pad Dry Gauze Roll Gauze 5. Secured With Agricultural engineer) Signed: 07/04/2020 5:49:17 PM By: Carlene Coria RN Entered By: Carlene Coria on 07/04/2020 14:25:27 -------------------------------------------------------------------------------- Vitals Details Patient Name: Date of Service: Leonard Dunning A. 07/04/2020 1:45 PM Medical Record Number: [id number] Patient Account Number: [id number] Date of Birth/Sex: Treating RN: 07/18/1961 (59 y.o. Jerilynn Mages) Carlene Coria Primary Care Lovey Crupi: [name] Other Clinician: Referring Ravenne Wayment: Treating Keyli Duross/Extender: [name] in Treatment: 8 Vital Signs Time Taken:  14:24 Temperature (F): 98 Height (in): 69 Pulse (bpm): 81 Weight (lbs): 190 Respiratory Rate (breaths/min): 18 Body Mass Index (BMI): 28.1 Blood Pressure (mmHg): 128/87 Reference Range: 80 - 120 mg / dl Electronic Signature(s) Signed: 07/04/2020 5:49:17 PM By: Carlene Coria RN Entered By: Carlene Coria on 07/04/2020 14:24:38

## 2020-07-07 ENCOUNTER — Telehealth: Payer: Self-pay | Admitting: Nurse Practitioner

## 2020-07-07 NOTE — Telephone Encounter (Signed)
Needs repeat UA.

## 2020-07-07 NOTE — Telephone Encounter (Signed)
Please advise.   Copied from [place] [id number]. Topic: General - Other >> Jul 07, 2020 11:17 AM Antonieta Iba C wrote: Reason for CRM: pt says that he was prescribed Nitrofurancoine 100 MG capsules for an infection. Pt says that he need another round of antibiotics. Pt would like to have a Rx sent in to pharmacy below   Pharmacy: [place] ([place]), [place] - [address]  Phone:  [phone] Fax:  [phone]

## 2020-07-08 NOTE — Telephone Encounter (Signed)
Pt is calling checking on the status on abx

## 2020-07-08 NOTE — Telephone Encounter (Signed)
Spoke to patient and informed he need to come and have a repeat urinalysis. Pt. Understood.

## 2020-07-09 ENCOUNTER — Other Ambulatory Visit: Payer: Self-pay | Admitting: Nurse Practitioner

## 2020-07-09 ENCOUNTER — Ambulatory Visit: Payer: MEDICAID | Attending: Nurse Practitioner

## 2020-07-09 ENCOUNTER — Other Ambulatory Visit: Payer: Self-pay

## 2020-07-09 DIAGNOSIS — N3001 Acute cystitis with hematuria: Secondary | ICD-10-CM

## 2020-07-09 LAB — POCT URINALYSIS DIP (CLINITEK)
Glucose, UA: NEGATIVE mg/dL
Ketones, POC UA: NEGATIVE mg/dL
Nitrite, UA: POSITIVE — AB
POC PROTEIN,UA: 100 — AB
Spec Grav, UA: >=1.03 — AB (ref 1.010–1.025)
Urobilinogen, UA: 1 E.U./dL
pH, UA: 5 (ref 5.0–8.0)

## 2020-07-09 MED ORDER — NITROFURANTOIN MONOHYD MACRO 100 MG PO CAPS: 100.0000 mg | ORAL_CAPSULE | Freq: Two times a day (BID) | ORAL | 0 refills | Status: AC

## 2020-07-10 ENCOUNTER — Other Ambulatory Visit: Payer: Self-pay | Admitting: Nurse Practitioner

## 2020-07-10 ENCOUNTER — Other Ambulatory Visit: Payer: Self-pay

## 2020-07-10 DIAGNOSIS — R809 Proteinuria, unspecified: Secondary | ICD-10-CM

## 2020-07-10 LAB — MICROSCOPIC EXAMINATION
Casts: NONE SEEN /lpf
WBC, UA: >30 /hpf — AB (ref 0–5)

## 2020-07-10 LAB — URINALYSIS, COMPLETE
Bilirubin, UA: NEGATIVE
Glucose, UA: NEGATIVE
Nitrite, UA: POSITIVE — AB
Specific Gravity, UA: 1.029 (ref 1.005–1.030)
Urobilinogen, Ur: 1 mg/dL (ref 0.2–1.0)
pH, UA: 5.5 (ref 5.0–7.5)

## 2020-07-11 ENCOUNTER — Encounter (HOSPITAL_BASED_OUTPATIENT_CLINIC_OR_DEPARTMENT_OTHER): Payer: Medicaid Other | Attending: Internal Medicine | Admitting: Internal Medicine

## 2020-07-11 DIAGNOSIS — Z9221 Personal history of antineoplastic chemotherapy: Secondary | ICD-10-CM | POA: Insufficient documentation

## 2020-07-11 DIAGNOSIS — M869 Osteomyelitis, unspecified: Secondary | ICD-10-CM | POA: Insufficient documentation

## 2020-07-11 DIAGNOSIS — I10 Essential (primary) hypertension: Secondary | ICD-10-CM | POA: Diagnosis not present

## 2020-07-11 DIAGNOSIS — Z86711 Personal history of pulmonary embolism: Secondary | ICD-10-CM | POA: Insufficient documentation

## 2020-07-11 DIAGNOSIS — Z7901 Long term (current) use of anticoagulants: Secondary | ICD-10-CM | POA: Insufficient documentation

## 2020-07-11 DIAGNOSIS — F17218 Nicotine dependence, cigarettes, with other nicotine-induced disorders: Secondary | ICD-10-CM | POA: Insufficient documentation

## 2020-07-11 DIAGNOSIS — G629 Polyneuropathy, unspecified: Secondary | ICD-10-CM | POA: Diagnosis present

## 2020-07-11 DIAGNOSIS — M1990 Unspecified osteoarthritis, unspecified site: Secondary | ICD-10-CM | POA: Insufficient documentation

## 2020-07-11 LAB — URINE CULTURE

## 2020-07-15 NOTE — Progress Notes (Signed)
Leonard Bentley ([id number]) Visit Report for 07/11/2020 HPI Details Patient Name: Date of Service: Leonard Bentley 07/11/2020 9:15 A M Medical Record Number: [id number] Patient Account Number: [id number] Date of Birth/Sex: Treating RN: 10-Jan-1961 (59 y.o. Leonard Bentley Primary Care Provider: Geryl Bentley Other Clinician: Referring Provider: Treating Provider/Extender: Leonard Bentley in Treatment: 9 History of Present Illness HPI Description: 05/07/2020 upon evaluation today patient appears to be doing somewhat poorly in regard to a surgery site on his left foot which is a transmetatarsal amputation site. He actually had his second and third toes removed in March 2021. Unfortunately that did not heal appropriately and so he subsequently in May he had a transmetatarsal amputation performed by Leonard Bentley. Subsequently this unfortunately also dehisced and though he still has sutures in place this has been about 6 weeks out and unfortunately the sutures are not holding anything together there is necrotic tissue that the sutures are actually embedded into and overall this really needs to be cleaned out and to be honest I think the patient really needs a wound VAC. With that being said there does not fortunately appear to be any signs of infection which is good news. Nonetheless I do believe this is definitely going to be a significant time to healing even with the wound VAC in order to get this completely closed. There is a lot of depth to the wound through the necrotic tissue and again a lot of the necrotic tissue needs to be removed we may be able to do some of that today I am not thinking we will get everything cleaned out however. No fevers, chills, nausea, vomiting, or diarrhea. The patient does have a history of nicotine dependence and we did discuss that in detail today but other than that he really has no major medical problems at this point other than what is going on  with his feet. In fact it really has not even been a determination as to why he is developing the wounds and having trouble healing his blood flow is good he does however smoke which could be affecting things in my opinion. 05/14/20 upon evaluation today patient appears to be doing better in my opinion. Overall the appearance of the wound is improved and I am very pleased with that. He has a tremendous amount of the necrotic tissue that between debridement last week, the use of Anasept in the interim, and debridement today has cleared away and again though he does have some depth this appears to be looking much more healthy. Overall I think a wound VAC would still do excellent for him even more so at this point. 7/16; patient I am seeing for the first time today. Considerable improvement in the surgical wound of the left TMA site. 4 days on a wound VAC. The majority of this is healthy granulated tissue. Some surface slough. Looking back through the pictures there is really been a tremendous improvement since his arrival in the clinic. He does not have a macrovascular issue. 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 . Silver collagen under the VAC. He is making nice progress 06/06/2020 on evaluation today patient actually appears to be doing quite well with regard to his wound. He is making good progress and very pleased in that regard. There is no signs of active infection at this time which is also good news. He seems to be tolerating the wound VAC without  any complication and I am seeing excellent improvement especially compared to where things started when I first saw him here in the clinic. He is very pleased with where things stand. 06/13/2020 on evaluation today patient's wound actually showed signs of excellent improvement. I am actually very pleased with where things stand and he is progressing quite nicely. 06/20/20-Patient comes today with left  transmetatarsal amputation wound looking good, he did notice a small suture that popped up on the medial edge of the wound which was removed today 8/20; left transmetatarsal amputation site this looks quite a bit better than when I last saw this. He has 2 linear areas with some depth and minimal undermining. The rest of this is superficial. Everything looks healthy. He is offloading this with the knees crutch and a walker 8/27; left transmetatarsal amputation site. This continues to improve the linear areas of filled in nice healthy red granulation tissue. I see no need for continuing the wound VAC at this point. The patient has Medicaid pending therefore were going to have to be careful about what dressings we use going forward 9/3; left transmetatarsal amputation site. This was done by Leonard Bentley he is not a diabetic. He does not appear to have PAD. Presumably he had a serious diabetic foot infection although I do not see description of that in our records. In any case his surgical wound dehiscence wound was treated with a wound VAC. We discontinued this last week. He has a very healthy small wound remaining on the left transmetatarsal amputation site this has no depth we have been using [employer]) Signed: 07/15/2020 7:41:19 AM By: Leonard Ham MD Entered By: Leonard Bentley on 07/11/2020 08:15:30 -------------------------------------------------------------------------------- Physical Exam Details Patient Name: Date of Service: Leonard Dunning A. 07/11/2020 9:15 A M Medical Record Number: [id number] Patient Account Number: [id number] Date of Birth/Sex: Treating RN: 02-03-1961 (59 y.o. Leonard Bentley Primary Care Provider: Geryl Bentley Other Clinician: Referring Provider: Treating Provider/Extender: Leonard Bentley in Treatment: 9 Constitutional Sitting or standing Blood Pressure is within target range for patient.. Pulse regular and within  target range for patient.Marland Kitchen Respirations regular, non-labored and within target range.. Temperature is normal and within the target range for the patient.Marland Kitchen Appears in no distress. Cardiovascular Both posterior tibial and dorsalis pedis pulses are easily palpable on the left foot. Integumentary (Hair, Skin) No evidence of surrounding soft tissue infection. Notes Wound exam; left transmetatarsal amputation site. Small wound with no depth. Very healthy looking granulation and a rim of epithelialization. No evidence of infection. I think this is progressing towards closure Electronic Signature(s) Signed: 07/15/2020 7:41:19 AM By: Leonard Ham MD Entered By: Leonard Bentley on 07/11/2020 08:16:30 -------------------------------------------------------------------------------- Physician Orders Details Patient Name: Date of Service: Leonard Dunning A. 07/11/2020 9:15 A M Medical Record Number: [id number] Patient Account Number: [id number] Date of Birth/Sex: Treating RN: 1961-07-24 (59 y.o. Leonard Bentley Primary Care Provider: Geryl Bentley Other Clinician: Referring Provider: Treating Provider/Extender: Leonard Bentley in Treatment: 9 Verbal / Phone Orders: No Diagnosis Coding ICD-10 Coding Code Description S91.302A Unspecified open wound, left foot, initial encounter T81.31XA Disruption of external operation (surgical) wound, not elsewhere classified, initial encounter F17.218 Nicotine dependence, cigarettes, with other nicotine-induced disorders Follow-up Appointments Return Appointment in 2 weeks. Dressing Change Frequency Wound #1 Left Amputation Site - Transmetatarsal Change Dressing every other [place]. Wound Cleansing Wound #1 Left Amputation Site - Transmetatarsal Clean wound with Wound Cleanser Primary Wound Dressing Wound #1  Left Amputation Site - Transmetatarsal Hydrofera Blue Secondary Dressing Wound #1 Left Amputation Site -  Transmetatarsal Kerlix/Rolled Gauze ABD pad Other: - ace wrap Negative Presssure Wound Therapy Other: - Discontinue wound vac Edema Control Avoid standing for long periods of time Elevate legs to the level of the heart or above for 30 minutes daily and/or when sitting, a frequency of: Off-Loading Other: - minimal weight bearing left foot Electronic Signature(s) Signed: 07/11/2020 3:13:35 PM By: Kela Millin Signed: 07/15/2020 7:41:19 AM By: Leonard Ham MD Entered By: Kela Millin on 07/11/2020 08:10:30 -------------------------------------------------------------------------------- Problem List Details Patient Name: Date of Service: Leonard Dunning A. 07/11/2020 9:15 A M Medical Record Number: [id number] Patient Account Number: [id number] Date of Birth/Sex: Treating RN: 09-12-1961 (59 y.o. Leonard Bentley Primary Care Provider: Geryl Bentley Other Clinician: Referring Provider: Treating Provider/Extender: Leonard Bentley in Treatment: 9 Active Problems ICD-10 Encounter Code Description Active Date MDM Diagnosis S91.302A Unspecified open wound, left foot, initial encounter 05/07/2020 No Yes T81.31XA Disruption of external operation (surgical) wound, not elsewhere classified, 05/07/2020 No Yes initial encounter F17.218 Nicotine dependence, cigarettes, with other nicotine-induced disorders 05/07/2020 No Yes Inactive Problems Resolved Problems Electronic Signature(s) Signed: 07/15/2020 7:41:19 AM By: Leonard Ham MD Entered By: Leonard Bentley on 07/11/2020 08:13:48 -------------------------------------------------------------------------------- Progress Note Details Patient Name: Date of Service: Leonard Dunning A. 07/11/2020 9:15 A M Medical Record Number: [id number] Patient Account Number: [id number] Date of Birth/Sex: Treating RN: 1961/09/20 (60 y.o. Leonard Bentley Primary Care Provider: Geryl Bentley Other Clinician: Referring  Provider: Treating Provider/Extender: Tomasa Blase, Paulette Blanch in Treatment: 9 Subjective History of Present Illness (HPI) 05/07/2020 upon evaluation today patient appears to be doing somewhat poorly in regard to a surgery site on his left foot which is a transmetatarsal amputation site. He actually had his second and third toes removed in March 2021. Unfortunately that did not heal appropriately and so he subsequently in May he had a transmetatarsal amputation performed by Leonard Bentley. Subsequently this unfortunately also dehisced and though he still has sutures in place this has been about 6 weeks out and unfortunately the sutures are not holding anything together there is necrotic tissue that the sutures are actually embedded into and overall this really needs to be cleaned out and to be honest I think the patient really needs a wound VAC. With that being said there does not fortunately appear to be any signs of infection which is good news. Nonetheless I do believe this is definitely going to be a significant time to healing even with the wound VAC in order to get this completely closed. There is a lot of depth to the wound through the necrotic tissue and again a lot of the necrotic tissue needs to be removed we may be able to do some of that today I am not thinking we will get everything cleaned out however. No fevers, chills, nausea, vomiting, or diarrhea. The patient does have a history of nicotine dependence and we did discuss that in detail today but other than that he really has no major medical problems at this point other than what is going on with his feet. In fact it really has not even been a determination as to why he is developing the wounds and having trouble healing his blood flow is good he does however smoke which could be affecting things in my opinion. 05/14/20 upon evaluation today patient appears to be doing better in my opinion. Overall the appearance of the  wound is  improved and I am very pleased with that. He has a tremendous amount of the necrotic tissue that between debridement last week, the use of Anasept in the interim, and debridement today has cleared away and again though he does have some depth this appears to be looking much more healthy. Overall I think a wound VAC would still do excellent for him even more so at this point. 7/16; patient I am seeing for the first time today. Considerable improvement in the surgical wound of the left TMA site. 4 days on a wound VAC. The majority of this is healthy granulated tissue. Some surface slough. Looking back through the pictures there is really been a tremendous improvement since his arrival in the clinic. He does not have a macrovascular issue. 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 . Silver collagen under the VAC. He is making nice progress 06/06/2020 on evaluation today patient actually appears to be doing quite well with regard to his wound. He is making good progress and very pleased in that regard. There is no signs of active infection at this time which is also good news. He seems to be tolerating the wound VAC without any complication and I am seeing excellent improvement especially compared to where things started when I first saw him here in the clinic. He is very pleased with where things stand. 06/13/2020 on evaluation today patient's wound actually showed signs of excellent improvement. I am actually very pleased with where things stand and he is progressing quite nicely. 06/20/20-Patient comes today with left transmetatarsal amputation wound looking good, he did notice a small suture that popped up on the medial edge of the wound which was removed today 8/20; left transmetatarsal amputation site this looks quite a bit better than when I last saw this. He has 2 linear areas with some depth and minimal undermining. The rest of this is superficial.  Everything looks healthy. He is offloading this with the knees crutch and a walker 8/27; left transmetatarsal amputation site. This continues to improve the linear areas of filled in nice healthy red granulation tissue. I see no need for continuing the wound VAC at this point. The patient has Medicaid pending therefore were going to have to be careful about what dressings we use going forward 9/3; left transmetatarsal amputation site. This was done by Leonard Bentley he is not a diabetic. He does not appear to have PAD. Presumably he had a serious diabetic foot infection although I do not see description of that in our records. In any case his surgical wound dehiscence wound was treated with a wound VAC. We discontinued this last week. He has a very healthy small wound remaining on the left transmetatarsal amputation site this has no depth we have been using Hydrofera Blue Objective Constitutional Sitting or standing Blood Pressure is within target range for patient.. Pulse regular and within target range for patient.Marland Kitchen Respirations regular, non-labored and within target range.. Temperature is normal and within the target range for the patient.Marland Kitchen Appears in no distress. Vitals Time Taken: 7:56 AM, Height: 69 in, Source: Stated, Weight: 190 lbs, Source: Stated, BMI: 28.1, Temperature: 98.5 F, Pulse: 91 bpm, Respiratory Rate: 18 breaths/min, Blood Pressure: 120/87 mmHg. Cardiovascular Both posterior tibial and dorsalis pedis pulses are easily palpable on the left foot. General Notes: Wound exam; left transmetatarsal amputation site. Small wound with no depth. Very healthy looking granulation and a rim of epithelialization. No  evidence of infection. I think this is progressing towards closure Integumentary (Hair, Skin) No evidence of surrounding soft tissue infection. Wound #1 status is Open. Original cause of wound was Surgical Injury. The wound is located on the Left Amputation Site - Transmetatarsal. The  wound measures 1cm length x 1.9cm width x 0.1cm depth; 1.492cm^2 area and 0.149cm^3 volume. There is Fat Layer (Subcutaneous Tissue) exposed. There is no tunneling or undermining noted. There is a small amount of serosanguineous drainage noted. The wound margin is flat and intact. There is large (67-100%) red granulation within the wound bed. There is no necrotic tissue within the wound bed. Assessment Active Problems ICD-10 Unspecified open wound, left foot, initial encounter Disruption of external operation (surgical) wound, not elsewhere classified, initial encounter Nicotine dependence, cigarettes, with other nicotine-induced disorders Plan Follow-up Appointments: Return Appointment in 2 weeks. Dressing Change Frequency: Wound #1 Left Amputation Site - Transmetatarsal: Change Dressing every other [place]. Wound Cleansing: Wound #1 Left Amputation Site - Transmetatarsal: Clean wound with Wound Cleanser Primary Wound Dressing: Wound #1 Left Amputation Site - Transmetatarsal: Hydrofera Blue Secondary Dressing: Wound #1 Left Amputation Site - Transmetatarsal: Kerlix/Rolled Gauze ABD pad Other: - ace wrap Negative Presssure Wound Therapy: Other: - Discontinue wound vac Edema Control: Avoid standing for long periods of time Elevate legs to the level of the heart or above for 30 minutes daily and/or when sitting, a frequency of: Off-Loading: Other: - minimal weight bearing left foot 1. I am continuing Hydrofera Blue 2. I think the wound VAC can be sent back 3. Looking back through our records I cannot really tell what the original reason for the transmetatarsal amputation site was presumably he had an infection. I cannot see that he has any arterial insufficiency. His surgical wound dehisced and this is the wound that we have been treating. I cannot see that there is any other secondary prevention would have to do. He does not appear to have diabetes listed Electronic  Signature(s) Signed: 07/15/2020 7:41:19 AM By: Leonard Ham MD Entered By: Leonard Bentley on 07/11/2020 08:18:05 -------------------------------------------------------------------------------- SuperBill Details Patient Name: Date of Service: Leonard Dunning A. 07/11/2020 Medical Record Number: [id number] Patient Account Number: [id number] Date of Birth/Sex: Treating RN: 03/20/1961 (59 y.o. Leonard Bentley Primary Care Provider: Geryl Bentley Other Clinician: Referring Provider: Treating Provider/Extender: Leonard Bentley in Treatment: 9 Diagnosis Coding ICD-10 Codes Code Description [phone] Unspecified open wound, left foot, initial encounter T81.31XA Disruption of external operation (surgical) wound, not elsewhere classified, initial encounter F17.218 Nicotine dependence, cigarettes, with other nicotine-induced disorders Facility Procedures CPT4 Code: [id number] Description: 99213 - WOUND CARE VISIT-LEV 3 EST PT Modifier: Quantity: 1 Physician Procedures : CPT4 Code Description Modifier [id number] 99213 - WC PHYS LEVEL 3 - EST PT ICD-10 Diagnosis Description S91.302A Unspecified open wound, left foot, initial encounter T81.31XA Disruption of external operation (surgical) wound, not elsewhere classified,  initial encounter Quantity: 1 Electronic Signature(s) Signed: 07/15/2020 7:41:19 AM By: Leonard Ham MD Entered By: Leonard Bentley on 07/11/2020 08:18:21

## 2020-07-15 NOTE — Progress Notes (Signed)
BRAD, LIEURANCE ([id number]) Visit Report for 07/11/2020 Arrival Information Details Patient Name: Date of Service: Leonard Bentley 07/11/2020 9:15 A M Medical Record Number: [id number] Patient Account Number: [id number] Date of Birth/Sex: Treating RN: Nov 06, 1961 (59 y.o. [name] Primary Care Kamarius Buckbee: [name] Other Clinician: Referring Christana Angelica: Treating Rhesa Forsberg/Extender: [name] in Treatment: 9 Visit Information History Since Last Visit Added or deleted any medications: No Patient Arrived: [name] Any new allergies or adverse reactions: No Arrival Time: 07:55 Had a fall or experienced change in No Accompanied By: self activities of daily living that may affect Transfer Assistance: None risk of falls: Patient Identification Verified: Yes Signs or symptoms of abuse/neglect since last visito No Secondary Verification Process Completed: Yes Hospitalized since last visit: No Patient Requires Transmission-Based Precautions: No Implantable device outside of the clinic excluding No Patient Has Alerts: Yes cellular tissue based products placed in the center Patient Alerts: Patient on Blood Thinner since last visit: L ABI: 1.03 (01/2020) Has Dressing in Place as Prescribed: Yes Has Footwear/Offloading in Place as Prescribed: Yes Left: Other:knee crutch Pain Present Now: No Electronic Signature(s) Signed: 07/11/2020 2:45:56 PM By: [name] RN, BSN Entered By: [name] on 07/11/2020 07:56:00 -------------------------------------------------------------------------------- Clinic Level of Care Assessment Details Patient Name: Date of Service: Leonard Dunning A. 07/11/2020 9:15 A M Medical Record Number: [id number] Patient Account Number: [id number] Date of Birth/Sex: Treating RN: [birth year] (59 y.o. [name] Primary Care Kenzie Thoreson: [name] Other Clinician: Referring Aadan Chenier: Treating Vincy Feliz/Extender: [name] in Treatment: 9 Clinic Level of Care Assessment Items TOOL 4 Quantity Score X- 1 0 Use when only an EandM is performed on FOLLOW-UP visit ASSESSMENTS - Nursing Assessment / Reassessment X- 1 10 Reassessment of Co-morbidities (includes updates in patient status) X- 1 5 Reassessment of Adherence to Treatment Plan ASSESSMENTS - Wound and Skin A ssessment / Reassessment []  - 0 Simple Wound Assessment / Reassessment - one wound []  - 0 Complex Wound Assessment / Reassessment - multiple wounds []  - 0 Dermatologic / Skin Assessment (not related to wound area) ASSESSMENTS - Focused Assessment X- 1 5 Circumferential Edema Measurements - multi extremities []  - 0 Nutritional Assessment / Counseling / Intervention []  - 0 Lower Extremity Assessment (monofilament, tuning fork, pulses) []  - 0 Peripheral Arterial Disease Assessment (using hand held doppler) ASSESSMENTS - Ostomy and/or Continence Assessment and Care []  - 0 Incontinence Assessment and Management []  - 0 Ostomy Care Assessment and Management (repouching, etc.) PROCESS - Coordination of Care X - Simple Patient / Family Education for ongoing care 1 15 []  - 0 Complex (extensive) Patient / Family Education for ongoing care X- 1 10 Staff obtains Programmer, systems, Records, T Results / Process Orders est X- 1 10 Staff telephones HHA, Nursing Homes / Clarify orders / etc []  - 0 Routine Transfer to another Facility (non-emergent condition) []  - 0 Routine Hospital Admission (non-emergent condition) []  - 0 New Admissions / Biomedical engineer / Ordering NPWT Apligraf, etc. , []  - 0 Emergency Hospital Admission (emergent condition) X- 1 10 Simple Discharge Coordination []  - 0 Complex (extensive) Discharge Coordination PROCESS - Special Needs []  - 0 Pediatric / Minor Patient Management []  - 0 Isolation Patient Management []  - 0 Hearing / Language / Visual special needs []  - 0 Assessment of  Community assistance (transportation, D/C planning, etc.) []  - 0 Additional assistance / Altered mentation []  - 0 Support Surface(s) Assessment (bed, cushion, seat, etc.) INTERVENTIONS -  Wound Cleansing / Measurement X - Simple Wound Cleansing - one wound 1 5 $R'[]'sn$  - 0 Complex Wound Cleansing - multiple wounds X- 1 5 Wound Imaging (photographs - any number of wounds) $RemoveBe'[]'VbLeVZNdm$  - 0 Wound Tracing (instead of photographs) X- 1 5 Simple Wound Measurement - one wound $RemoveB'[]'owMEmVsL$  - 0 Complex Wound Measurement - multiple wounds INTERVENTIONS - Wound Dressings X - Small Wound Dressing one or multiple wounds 1 10 $Re'[]'Svl$  - 0 Medium Wound Dressing one or multiple wounds $RemoveBeforeD'[]'qheymNSGfDWsDm$  - 0 Large Wound Dressing one or multiple wounds X- 1 5 Application of Medications - topical $RemoveB'[]'GThVzsGP$  - 0 Application of Medications - injection INTERVENTIONS - Miscellaneous $RemoveBeforeD'[]'NJVJskIXahksEI$  - 0 External ear exam $Remove'[]'ZGwZbvT$  - 0 Specimen Collection (cultures, biopsies, blood, body fluids, etc.) $RemoveBefor'[]'gpovezhJsgMF$  - 0 Specimen(s) / Culture(s) sent or taken to Lab for analysis $RemoveBefo'[]'uTCngPtvtdZ$  - 0 Patient Transfer (multiple staff / Civil Service fast streamer / Similar devices) $RemoveBeforeDE'[]'qRzdGPYwgxKiQtZ$  - 0 Simple Staple / Suture removal (25 or less) $Remove'[]'FPixuiI$  - 0 Complex Staple / Suture removal (26 or more) $Remove'[]'RXGSDPf$  - 0 Hypo / Hyperglycemic Management (close monitor of Blood Glucose) $RemoveBefore'[]'cNovfRoFvdUIP$  - 0 Ankle / Brachial Index (ABI) - do not check if billed separately X- 1 5 Vital Signs Has the patient been seen at the hospital within the last three years: Yes Total Score: 100 Level Of Care: New/Established - Level 3 Electronic Signature(s) Signed: 07/11/2020 3:13:35 PM By: Kela Millin Entered By: Kela Millin on 07/11/2020 08:13:30 -------------------------------------------------------------------------------- Encounter Discharge Information Details Patient Name: Date of Service: Leonard Dunning A. 07/11/2020 9:15 A M Medical Record Number: [id number] Patient Account Number: [id number] Date of Birth/Sex: Treating RN: 1961-10-10 (59 y.o. Janyth Contes Primary Care Mariabelen Pressly: [name] Other Clinician: Referring Karlei Waldo: Treating Taino Maertens/Extender: [name] in Treatment: 9 Encounter Discharge Information Items Discharge Condition: Stable Ambulatory Status: Walker Discharge Destination: Home Transportation: Private Auto Accompanied By: alone Schedule Follow-up Appointment: Yes Clinical Summary of Care: Patient Declined Electronic Signature(s) Signed: 07/11/2020 3:22:52 PM By: Levan Hurst RN, BSN Entered By: Levan Hurst on 07/11/2020 10:24:23 -------------------------------------------------------------------------------- Lower Extremity Assessment Details Patient Name: Date of Service: Leonard Dunning A. 07/11/2020 9:15 A M Medical Record Number: [id number] Patient Account Number: [id number] Date of Birth/Sex: Treating RN: Mar 31, 1961 (59 y.o. [name] Primary Care Jamaurion Slemmer: [name] Other Clinician: Referring Joylene Wescott: Treating Daivd Fredericksen/Extender: [name] in Treatment: 9 Edema Assessment Assessed: [Left: No] [Right: No] Edema: [Left: Ye] [Right: s] Calf Left: Right: Point of Measurement: 31 cm From Medial Instep 35 cm cm Ankle Left: Right: Point of Measurement: 11 cm From Medial Instep 24.8 cm cm Vascular Assessment Pulses: Dorsalis Pedis Palpable: [Left:Yes] Electronic Signature(s) Signed: 07/11/2020 2:45:56 PM By: [name] RN, BSN Entered By: [name] on 07/11/2020 08:01:26 -------------------------------------------------------------------------------- Multi Wound Chart Details Patient Name: Date of Service: Leonard Dunning A. 07/11/2020 9:15 A M Medical Record Number: [id number] Patient Account Number: [id number] Date of Birth/Sex: Treating RN: 10/13/1961 (59 y.o. [name] Primary Care Navil Kole: [name] Other Clinician: Referring Kajsa Butrum: Treating Seiji Wiswell/Extender: [name] in Treatment: 9 Vital Signs Height(in): 69 Pulse(bpm): 91 Weight(lbs): 190 Blood Pressure(mmHg): 120/87 Body Mass Index(BMI): 28 Temperature(F): 98.5 Respiratory Rate(breaths/min): 18 Photos: [1:No Photos Left Amputation Site - Transmetatarsal N/A] [N/A:N/A] Wound Location: [1:Surgical Injury] [N/A:N/A] Wounding Event: [1:Dehisced Wound] [N/A:N/A] Primary Etiology: [1:Anemia, Arrhythmia, Congestive Heart N/A] Comorbid History: [1:Failure, Hypertension, Osteoarthritis, Osteomyelitis, Neuropathy, Received Chemotherapy 03/21/2020] [N/A:N/A] Date Acquired: [1:9] [N/A:N/A] Weeks of  Treatment: [1:Open] [N/A:N/A] Wound Status: [1:1x1.9x0.1] [N/A:N/A] Measurements L x W x D (cm) [1:1.492] [N/A:N/A] A (cm) : rea [1:0.149] [N/A:N/A] Volume (cm) : [1:95.80%] [N/A:N/A] % Reduction in Area: [1:99.90%] [N/A:N/A] % Reduction in Volume: [1:Full Thickness With Exposed Support N/A] Classification: [1:Structures Small] [N/A:N/A] Exudate Amount: [1:Serosanguineous] [N/A:N/A] Exudate Type: [1:red, brown] [N/A:N/A] Exudate Color: [1:Flat and Intact] [N/A:N/A] Wound Margin: [1:Large (67-100%)] [N/A:N/A] Granulation Amount: [1:Red] [N/A:N/A] Granulation Quality: [1:None Present (0%)] [N/A:N/A] Necrotic Amount: [1:Fat Layer (Subcutaneous Tissue): Yes N/A] Exposed Structures: [1:Fascia: No Tendon: No Muscle: No Joint: No Bone: No Small (1-33%)] [N/A:N/A] Treatment Notes Electronic Signature(s) Signed: 07/11/2020 3:13:35 PM By: Kela Millin Signed: 07/15/2020 7:41:19 AM By: Linton Ham MD Entered By: Linton Ham on 07/11/2020 08:13:55 -------------------------------------------------------------------------------- Multi-Disciplinary Care Plan Details Patient Name: Date of Service: Leonard Dunning A. 07/11/2020 9:15 A M Medical Record Number: [id number] Patient Account Number: [id number] Date of Birth/Sex: Treating RN: 01-30-1961 (59 y.o. [name] Primary Care  Kaniyah Lisby: [name] Other Clinician: Referring Desirey Keahey: Treating Shauntavia Brackin/Extender: [name] in Treatment: 9 Active Inactive Abuse / Safety / Falls / Self Care Management Nursing Diagnoses: Potential for falls Goals: Patient/caregiver will verbalize/demonstrate measures taken to prevent injury and/or falls Date Initiated: 05/07/2020 Target Resolution Date: 07/25/2020 Goal Status: Active Interventions: Assess fall risk on admission and as needed Assess impairment of mobility on admission and as needed per policy Notes: Wound/Skin Impairment Nursing Diagnoses: Impaired tissue integrity Knowledge deficit related to smoking impact on wound healing Knowledge deficit related to ulceration/compromised skin integrity Goals: Patient will demonstrate a reduced rate of smoking or cessation of smoking Date Initiated: 05/07/2020 Target Resolution Date: 07/25/2020 Goal Status: Active Patient/caregiver will verbalize understanding of skin care regimen Date Initiated: 05/07/2020 Target Resolution Date: 07/25/2020 Goal Status: Active Ulcer/skin breakdown will have a volume reduction of 30% by week 4 Date Initiated: 05/07/2020 Date Inactivated: 07/04/2020 Target Resolution Date: 07/04/2020 Goal Status: Met Interventions: Assess patient/caregiver ability to obtain necessary supplies Assess patient/caregiver ability to perform ulcer/skin care regimen upon admission and as needed Assess ulceration(s) every visit Provide education on ulcer and skin care Treatment Activities: Skin care regimen initiated : 05/07/2020 Topical wound management initiated : 05/07/2020 Notes: Electronic Signature(s) Signed: 07/11/2020 3:13:35 PM By: Kela Millin Entered By: Kela Millin on 07/11/2020 07:55:03 -------------------------------------------------------------------------------- Pain Assessment Details Patient Name: Date of Service: Leonard Dunning A. 07/11/2020 9:15 A  M Medical Record Number: [id number] Patient Account Number: [id number] Date of Birth/Sex: Treating RN: 22-Oct-1961 (59 y.o. [name] Primary Care Jarae Nemmers: [name] Other Clinician: Referring Dory Verdun: Treating Chinedum Vanhouten/Extender: [name] in Treatment: 9 Active Problems Location of Pain Severity and Description of Pain Patient Has Paino No Site Locations Rate the pain. Current Pain Level: 0 Pain Management and Medication Current Pain Management: Electronic Signature(s) Signed: 07/11/2020 2:45:56 PM By: [name] RN, BSN Entered By: [name] on 07/11/2020 08:00:27 -------------------------------------------------------------------------------- Patient/Caregiver Education Details Patient Name: Date of Service: Leonard Bentley 9/3/2021andnbsp9:15 A M Medical Record Number: [id number] Patient Account Number: [id number] Date of Birth/Gender: Treating RN: [birth year] (59 y.o. [name] Primary Care Physician: [name] Other Clinician: Referring Physician: Treating Physician/Extender: [name] in Treatment: 9 Education Assessment Education Provided To: Patient Education Topics Provided Wound/Skin Impairment: Handouts: Caring for Your Ulcer Methods: Explain/Verbal Responses: State content correctly Electronic Signature(s) Signed: 07/11/2020 3:13:35 PM By: Kela Millin Entered By: Kela Millin on 07/11/2020 07:55:37 -------------------------------------------------------------------------------- Wound Assessment Details Patient Name: Date of  Service: Denyse Amass E A. 07/11/2020 9:15 A M Medical Record Number: [id number] Patient Account Number: [id number] Date of Birth/Sex: Treating RN: December 06, 1960 (59 y.o. [name] Primary Care Hassani Sliney: [name] Other Clinician: Referring Karolyn Messing: Treating Chantille Navarrete/Extender: [name] in  Treatment: 9 Wound Status Wound Number: 1 Primary Dehisced Wound Etiology: Wound Location: Left Amputation Site - Transmetatarsal Wound Open Wounding Event: Surgical Injury Status: Date Acquired: 03/21/2020 Comorbid Anemia, Arrhythmia, Congestive Heart Failure, Hypertension, Weeks Of Treatment: 9 History: Osteoarthritis, Osteomyelitis, Neuropathy, Received Chemotherapy Clustered Wound: No Wound Measurements Length: (cm) 1 Width: (cm) 1.9 Depth: (cm) 0.1 Area: (cm) 1.492 Volume: (cm) 0.149 % Reduction in Area: 95.8% % Reduction in Volume: 99.9% Epithelialization: Small (1-33%) Tunneling: No Undermining: No Wound Description Classification: Full Thickness With Exposed Support Structures Wound Margin: Flat and Intact Exudate Amount: Small Exudate Type: Serosanguineous Exudate Color: red, brown Foul Odor After Cleansing: No Slough/Fibrino No Wound Bed Granulation Amount: Large (67-100%) Exposed Structure Granulation Quality: Red Fascia Exposed: No Necrotic Amount: None Present (0%) Fat Layer (Subcutaneous Tissue) Exposed: Yes Tendon Exposed: No Muscle Exposed: No Joint Exposed: No Bone Exposed: No Treatment Notes Wound #1 (Left Amputation Site - Transmetatarsal) 1. Cleanse With Wound Cleanser 3. Primary Dressing Applied Hydrofera Blue 4. Secondary Dressing ABD Pad Roll Gauze 5. Secured With Naval architect) Signed: 07/11/2020 2:45:56 PM By: [name] RN, BSN Entered By: [name] on 07/11/2020 08:03:37 -------------------------------------------------------------------------------- Vitals Details Patient Name: Date of Service: Leonard Dunning A. 07/11/2020 9:15 A M Medical Record Number: [id number] Patient Account Number: [id number] Date of Birth/Sex: Treating RN: 1961/07/25 (60 y.o. [name] Primary Care Olive Zmuda: [name] Other Clinician: Referring Terrence Wishon: Treating Deyani [name]/Extender: [name] in Treatment: 9 Vital Signs Time Taken: 07:56 Temperature (F): 98.5 Height (in): 69 Pulse (bpm): 91 Source: Stated Respiratory Rate (breaths/min): 18 Weight (lbs): 190 Blood Pressure (mmHg): 120/87 Source: Stated Reference Range: 80 - 120 mg / dl Body Mass Index (BMI): 28.1 Electronic Signature(s) Signed: 07/11/2020 2:45:56 PM By: [name] RN, BSN Entered By: [name] on 07/11/2020 07:58:45

## 2020-07-16 ENCOUNTER — Telehealth: Payer: Self-pay | Admitting: Nurse Practitioner

## 2020-07-16 NOTE — Telephone Encounter (Signed)
Please advise.   Copied from [facility] [phone]. Topic: General - Other >> Jul 16, 2020  4:04 PM Leward Quan A wrote: Reason for CRM: Patient called to say that he received a letter stating that he was referred to a Urologist but the Dr did not tell him anything about this. Asking for a call back with an explanation please Ph# [phone]

## 2020-07-17 NOTE — Telephone Encounter (Signed)
Spoke to patient and informed on lab results. Pt. Understood. Pt stated he will apply for the CAFA.

## 2020-07-23 ENCOUNTER — Other Ambulatory Visit: Payer: Self-pay

## 2020-07-23 ENCOUNTER — Ambulatory Visit: Payer: Self-pay | Attending: Internal Medicine | Admitting: Pharmacist

## 2020-07-23 DIAGNOSIS — Z9889 Other specified postprocedural states: Secondary | ICD-10-CM

## 2020-07-23 DIAGNOSIS — Z86711 Personal history of pulmonary embolism: Secondary | ICD-10-CM

## 2020-07-23 LAB — POCT INR: INR: 2.1 (ref 2.0–3.0)

## 2020-07-23 NOTE — Patient Instructions (Signed)
Description   Continue taking 1/2 of a tablet on Mondays, Wednesdays, Fridays, and Saturdays with 1 tablet all other days. See you in 1 month!

## 2020-07-25 ENCOUNTER — Encounter (HOSPITAL_BASED_OUTPATIENT_CLINIC_OR_DEPARTMENT_OTHER): Payer: Medicaid Other | Admitting: Internal Medicine

## 2020-07-25 DIAGNOSIS — G629 Polyneuropathy, unspecified: Secondary | ICD-10-CM | POA: Diagnosis not present

## 2020-07-27 ENCOUNTER — Other Ambulatory Visit: Payer: Self-pay | Admitting: Family Medicine

## 2020-07-27 DIAGNOSIS — I10 Essential (primary) hypertension: Secondary | ICD-10-CM

## 2020-07-27 NOTE — Telephone Encounter (Signed)
Requested Prescriptions  Pending Prescriptions Disp Refills  . metoprolol succinate (TOPROL-XL) 25 MG 24 hr tablet [Pharmacy Med Name: METOPROLOL ER SUCCINATE 25MG  TABS] 45 tablet 1    Sig: TAKE 1/2 TABLET(12.5 MG) BY MOUTH DAILY     Cardiovascular:  Beta Blockers Passed - 07/27/2020  3:10 AM      Passed - Last BP in normal range    BP Readings from Last 1 Encounters:  06/16/20 128/88         Passed - Last Heart Rate in normal range    Pulse Readings from Last 1 Encounters:  06/16/20 93         Passed - Valid encounter within last 6 months    Recent Outpatient Visits          1 month ago Essential hypertension   [place], Vernia Buff, NP   5 months ago Excessive cerumen in both ear canals   [place], [place], NP   8 months ago GAD (generalized anxiety disorder)   [place] [name], NP   11 months ago [place], Vernia Buff, NP   1 year ago SVT (supraventricular tachycardia) [facility])   [place], Vernia Buff, NP             Previous refill on 06/16/2020 was submitted to [id number] in [place].  This Rx request from [facility] # [id number].

## 2020-07-28 NOTE — Progress Notes (Signed)
Leonard, Bentley ([id number]) Visit Report for 07/25/2020 HPI Details Patient Name: Date of Service: Leonard Bentley 07/25/2020 9:00 A M Medical Record Number: [id number] Patient Account Number: [id number] Date of Birth/Sex: Treating RN: 15-Aug-1961 (59 y.o. Leonard Bentley Primary Care Provider: Geryl Bentley Other Clinician: Referring Provider: Treating Provider/Extender: Leonard Bentley in Treatment: 11 History of Present Illness HPI Description: 05/07/2020 upon evaluation today patient appears to be doing somewhat poorly in regard to a surgery site on his left foot which is a transmetatarsal amputation site. He actually had his second and third toes removed in March 2021. Unfortunately that did not heal appropriately and so he subsequently in May he had a transmetatarsal amputation performed by Dr. Sharol Bentley. Subsequently this unfortunately also dehisced and though he still has sutures in place this has been about 6 weeks out and unfortunately the sutures are not holding anything together there is necrotic tissue that the sutures are actually embedded into and overall this really needs to be cleaned out and to be honest I think the patient really needs a wound VAC. With that being said there does not fortunately appear to be any signs of infection which is good news. Nonetheless I do believe this is definitely going to be a significant time to healing even with the wound VAC in order to get this completely closed. There is a lot of depth to the wound through the necrotic tissue and again a lot of the necrotic tissue needs to be removed we may be able to do some of that today I am not thinking we will get everything cleaned out however. No fevers, chills, nausea, vomiting, or diarrhea. The patient does have a history of nicotine dependence and we did discuss that in detail today but other than that he really has no major medical problems at this point other than what is going on  with his feet. In fact it really has not even been a determination as to why he is developing the wounds and having trouble healing his blood flow is good he does however smoke which could be affecting things in my opinion. 05/14/20 upon evaluation today patient appears to be doing better in my opinion. Overall the appearance of the wound is improved and I am very pleased with that. He has a tremendous amount of the necrotic tissue that between debridement last week, the use of Anasept in the interim, and debridement today has cleared away and again though he does have some depth this appears to be looking much more healthy. Overall I think a wound VAC would still do excellent for him even more so at this point. 7/16; patient I am seeing for the first time today. Considerable improvement in the surgical wound of the left TMA site. 4 days on a wound VAC. The majority of this is healthy granulated tissue. Some surface slough. Looking back through the pictures there is really been a tremendous improvement since his arrival in the clinic. He does not have a macrovascular issue. 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 . Silver collagen under the VAC. He is making nice progress 06/06/2020 on evaluation today patient actually appears to be doing quite well with regard to his wound. He is making good progress and very pleased in that regard. There is no signs of active infection at this time which is also good news. He seems to be tolerating the wound VAC without  any complication and I am seeing excellent improvement especially compared to where things started when I first saw him here in the clinic. He is very pleased with where things stand. 06/13/2020 on evaluation today patient's wound actually showed signs of excellent improvement. I am actually very pleased with where things stand and he is progressing quite nicely. 06/20/20-Patient comes today with left  transmetatarsal amputation wound looking good, he did notice a small suture that popped up on the medial edge of the wound which was removed today 8/20; left transmetatarsal amputation site this looks quite a bit better than when I last saw this. He has 2 linear areas with some depth and minimal undermining. The rest of this is superficial. Everything looks healthy. He is offloading this with the knees crutch and a walker 8/27; left transmetatarsal amputation site. This continues to improve the linear areas of filled in nice healthy red granulation tissue. I see no need for continuing the wound VAC at this point. The patient has Medicaid pending therefore were going to have to be careful about what dressings we use going forward 9/3; left transmetatarsal amputation site. This was done by Dr. Sharol Bentley he is not a diabetic. He does not appear to have PAD. Presumably he had a serious diabetic foot infection although I do not see description of that in our records. In any case his surgical wound dehiscence wound was treated with a wound VAC. We discontinued this last week. He has a very healthy small wound remaining on the left transmetatarsal amputation site this has no depth we have been using Hydrofera Blue 9/17; the patient's transmetatarsal amputation site wounds have totally closed. Electronic Signature(s) Signed: 07/28/2020 8:08:43 AM By: Leonard Ham MD Entered By: Leonard Bentley on 07/25/2020 09:35:44 -------------------------------------------------------------------------------- Physical Exam Details Patient Name: Date of Service: Leonard Bentley A. 07/25/2020 9:00 A M Medical Record Number: [id number] Patient Account Number: [id number] Date of Birth/Sex: Treating RN: December 19, 1960 (59 y.o. Leonard Bentley Primary Care Provider: Geryl Bentley Other Clinician: Referring Provider: Treating Provider/Extender: Leonard Bentley in Treatment: 11 Constitutional Sitting or  standing Blood Pressure is within target range for patient.. Pulse regular and within target range for patient.Marland Kitchen Respirations regular, non-labored and within target range.. Temperature is normal and within the target range for the patient.Marland Kitchen Appears in no distress. Notes Wound exam; left transmetatarsal amputation site the wound is totally epithelialized. All of this looks very healthy. He has palpable pedal pulses. Electronic Signature(s) Signed: 07/28/2020 8:08:43 AM By: Leonard Ham MD Entered By: Leonard Bentley on 07/25/2020 09:36:15 -------------------------------------------------------------------------------- Physician Orders Details Patient Name: Date of Service: Leonard Bentley A. 07/25/2020 9:00 A M Medical Record Number: [id number] Patient Account Number: [id number] Date of Birth/Sex: Treating RN: September 12, 1961 (59 y.o. Leonard Bentley Primary Care Provider: Geryl Bentley Other Clinician: Referring Provider: Treating Provider/Extender: Leonard Bentley in Treatment: 11 Verbal / Phone Orders: No Diagnosis Coding ICD-10 Coding Code Description Q73.419F Unspecified open wound, left foot, initial encounter T81.31XA Disruption of external operation (surgical) wound, not elsewhere classified, initial encounter F17.218 Nicotine dependence, cigarettes, with other nicotine-induced disorders Discharge From [facility] Services Discharge from [place] - call if wound re-opens or new wound develops Skin Barriers/Peri-Wound Care Moisturizing lotion - at night Edema Control Avoid standing for long periods of time Elevate legs to the level of the heart or above for 30 minutes daily and/or when sitting, a frequency of: Other: - protect site with dressing Electronic Signature(s) Signed: 07/28/2020  8:08:43 AM By: Leonard Ham MD Signed: 07/28/2020 1:33:30 PM By: Kela Millin Entered By: Kela Millin on 07/25/2020  09:25:11 -------------------------------------------------------------------------------- Problem List Details Patient Name: Date of Service: Leonard Bentley A. 07/25/2020 9:00 A M Medical Record Number: [id number] Patient Account Number: [id number] Date of Birth/Sex: Treating RN: [birth year] (59 y.o. Leonard Bentley Primary Care Provider: Geryl Bentley Other Clinician: Referring Provider: Treating Provider/Extender: Leonard Bentley in Treatment: 11 Active Problems ICD-10 Encounter Code Description Active Date MDM Diagnosis S91.302A Unspecified open wound, left foot, initial encounter 05/07/2020 No Yes T81.31XA Disruption of external operation (surgical) wound, not elsewhere classified, 05/07/2020 No Yes initial encounter F17.218 Nicotine dependence, cigarettes, with other nicotine-induced disorders 05/07/2020 No Yes Inactive Problems Resolved Problems Electronic Signature(s) Signed: 07/28/2020 8:08:43 AM By: Leonard Ham MD Entered By: Leonard Bentley on 07/25/2020 09:34:57 -------------------------------------------------------------------------------- Progress Note Details Patient Name: Date of Service: Leonard Bentley A. 07/25/2020 9:00 A M Medical Record Number: [id number] Patient Account Number: [id number] Date of Birth/Sex: Treating RN: [birth year] (59 y.o. Leonard Bentley Primary Care Provider: Geryl Bentley Other Clinician: Referring Provider: Treating Provider/Extender: Leonard Bentley in Treatment: 11 Subjective History of Present Illness (HPI) 05/07/2020 upon evaluation today patient appears to be doing somewhat poorly in regard to a surgery site on his left foot which is a transmetatarsal amputation site. He actually had his second and third toes removed in March 2021. Unfortunately that did not heal appropriately and so he subsequently in May he had a transmetatarsal amputation performed by Dr. Sharol Bentley. Subsequently this  unfortunately also dehisced and though he still has sutures in place this has been about 6 weeks out and unfortunately the sutures are not holding anything together there is necrotic tissue that the sutures are actually embedded into and overall this really needs to be cleaned out and to be honest I think the patient really needs a wound VAC. With that being said there does not fortunately appear to be any signs of infection which is good news. Nonetheless I do believe this is definitely going to be a significant time to healing even with the wound VAC in order to get this completely closed. There is a lot of depth to the wound through the necrotic tissue and again a lot of the necrotic tissue needs to be removed we may be able to do some of that today I am not thinking we will get everything cleaned out however. No fevers, chills, nausea, vomiting, or diarrhea. The patient does have a history of nicotine dependence and we did discuss that in detail today but other than that he really has no major medical problems at this point other than what is going on with his feet. In fact it really has not even been a determination as to why he is developing the wounds and having trouble healing his blood flow is good he does however smoke which could be affecting things in my opinion. 05/14/20 upon evaluation today patient appears to be doing better in my opinion. Overall the appearance of the wound is improved and I am very pleased with that. He has a tremendous amount of the necrotic tissue that between debridement last week, the use of Anasept in the interim, and debridement today has cleared away and again though he does have some depth this appears to be looking much more healthy. Overall I think a wound VAC would still do excellent for him even more so at this point. 7/16; patient I  am seeing for the first time today. Considerable improvement in the surgical wound of the left TMA site. 4 days on a wound VAC.  The majority of this is healthy granulated tissue. Some surface slough. Looking back through the pictures there is really been a tremendous improvement since his arrival in the clinic. He does not have a macrovascular issue. 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 . Silver collagen under the VAC. He is making nice progress 06/06/2020 on evaluation today patient actually appears to be doing quite well with regard to his wound. He is making good progress and very pleased in that regard. There is no signs of active infection at this time which is also good news. He seems to be tolerating the wound VAC without any complication and I am seeing excellent improvement especially compared to where things started when I first saw him here in the clinic. He is very pleased with where things stand. 06/13/2020 on evaluation today patient's wound actually showed signs of excellent improvement. I am actually very pleased with where things stand and he is progressing quite nicely. 06/20/20-Patient comes today with left transmetatarsal amputation wound looking good, he did notice a small suture that popped up on the medial edge of the wound which was removed today 8/20; left transmetatarsal amputation site this looks quite a bit better than when I last saw this. He has 2 linear areas with some depth and minimal undermining. The rest of this is superficial. Everything looks healthy. He is offloading this with the knees crutch and a walker 8/27; left transmetatarsal amputation site. This continues to improve the linear areas of filled in nice healthy red granulation tissue. I see no need for continuing the wound VAC at this point. The patient has Medicaid pending therefore were going to have to be careful about what dressings we use going forward 9/3; left transmetatarsal amputation site. This was done by Dr. Sharol Bentley he is not a diabetic. He does not appear to have PAD.  Presumably he had a serious diabetic foot infection although I do not see description of that in our records. In any case his surgical wound dehiscence wound was treated with a wound VAC. We discontinued this last week. He has a very healthy small wound remaining on the left transmetatarsal amputation site this has no depth we have been using Hydrofera Blue 9/17; the patient's transmetatarsal amputation site wounds have totally closed. Objective Constitutional Sitting or standing Blood Pressure is within target range for patient.. Pulse regular and within target range for patient.Marland Kitchen Respirations regular, non-labored and within target range.. Temperature is normal and within the target range for the patient.Marland Kitchen Appears in no distress. Vitals Time Taken: 8:54 AM, Height: 69 in, Weight: 190 lbs, BMI: 28.1, Temperature: 97.5 F, Pulse: 74 bpm, Respiratory Rate: 18 breaths/min, Blood Pressure: 121/78 mmHg. General Notes: Wound exam; left transmetatarsal amputation site the wound is totally epithelialized. All of this looks very healthy. He has palpable pedal pulses. Integumentary (Hair, Skin) Wound #1 status is Healed - Epithelialized. Original cause of wound was Surgical Injury. The wound is located on the Left Amputation Site - Transmetatarsal. The wound measures 0cm length x 0cm width x 0cm depth; 0cm^2 area and 0cm^3 volume. There is no tunneling or undermining noted. There is a none present amount of drainage noted. The wound margin is flat and intact. There is no granulation within the wound bed. There is no necrotic tissue within the  wound bed. Assessment Active Problems ICD-10 Unspecified open wound, left foot, initial encounter Disruption of external operation (surgical) wound, not elsewhere classified, initial encounter Nicotine dependence, cigarettes, with other nicotine-induced disorders Plan Discharge From [facility] Services: Discharge from [place] - call if wound re-opens or new  wound develops Skin Barriers/Peri-Wound Care: Moisturizing lotion - at night Edema Control: Avoid standing for long periods of time Elevate legs to the level of the heart or above for 30 minutes daily and/or when sitting, a frequency of: Other: - protect site with dressing 1. Everything is closed. 2. The patient had questions about getting back to aerobic exercise such as jogging. I asked him to call Dr. Leigh Aurora office who is seen him in the past and ask about a forefoot prosthesis. His original surgery was by Dr. Sharol Bentley. 3. I advised him to keep the area padded but he is by what ever mechanism clear to get back into usual activity Electronic Signature(s) Signed: 07/28/2020 8:08:43 AM By: Leonard Ham MD Entered By: Leonard Bentley on 07/25/2020 09:37:20 -------------------------------------------------------------------------------- SuperBill Details Patient Name: Date of Service: Leonard Bentley A. 07/25/2020 Medical Record Number: [id number] Patient Account Number: [id number] Date of Birth/Sex: Treating RN: 25-Jun-1961 (59 y.o. Leonard Bentley Primary Care Provider: Geryl Bentley Other Clinician: Referring Provider: Treating Provider/Extender: Leonard Bentley in Treatment: 11 Diagnosis Coding ICD-10 Codes Code Description [phone] Unspecified open wound, left foot, initial encounter T81.31XA Disruption of external operation (surgical) wound, not elsewhere classified, initial encounter F17.218 Nicotine dependence, cigarettes, with other nicotine-induced disorders Facility Procedures CPT4 Code: [id number] Description: 99213 - WOUND CARE VISIT-LEV 3 EST PT Modifier: Quantity: 1 Physician Procedures : CPT4 Code Description Modifier [id number] - WC PHYS LEVEL 2 - EST PT ICD-10 Diagnosis Description M75.449E Unspecified open wound, left foot, initial encounter T81.31XA Disruption of external operation (surgical) wound, not elsewhere classified,  initial  encounter Quantity: 1 Electronic Signature(s) Signed: 07/28/2020 8:08:43 AM By: Leonard Ham MD Entered By: Leonard Bentley on 07/25/2020 09:37:37

## 2020-07-28 NOTE — Progress Notes (Signed)
RONDEY, FALLEN ([id number]) Visit Report for 07/25/2020 Arrival Information Details Patient Name: Date of Service: Leonard Bentley 07/25/2020 9:00 A M Medical Record Number: [id number] Patient Account Number: [id number] Date of Birth/Sex: Treating RN: 1961-09-04 (59 y.o. [name] Primary Care Yashica Sterbenz: [name] Other Clinician: Referring Takesha Steger: Treating Exilda Wilhite/Extender: [name] in Treatment: 11 Visit Information History Since Last Visit Added or deleted any medications: No Patient Arrived: [name] Any new allergies or adverse reactions: No Arrival Time: 08:53 Had a fall or experienced change in No Accompanied By: self activities of daily living that may affect Transfer Assistance: None risk of falls: Patient Identification Verified: Yes Signs or symptoms of abuse/neglect since last visito No Secondary Verification Process Completed: Yes Hospitalized since last visit: No Patient Requires Transmission-Based Precautions: No Implantable device outside of the clinic excluding No Patient Has Alerts: Yes cellular tissue based products placed in the center Patient Alerts: Patient on Blood Thinner since last visit: L ABI: 1.03 (01/2020) Has Dressing in Place as Prescribed: Yes Pain Present Now: No Electronic Signature(s) Signed: 07/28/2020 8:02:18 AM By: Sandre Kitty Entered By: Sandre Kitty on 07/25/2020 08:54:07 -------------------------------------------------------------------------------- Clinic Level of Care Assessment Details Patient Name: Date of Service: Leonard Bentley 07/25/2020 9:00 A M Medical Record Number: [id number] Patient Account Number: [id number] Date of Birth/Sex: Treating RN: 05-17-1961 (59 y.o. [name] Primary Care Vedh Ptacek: [name] Other Clinician: Referring Jackquelyn Sundberg: Treating Jaydyn Bozzo/Extender: [name] in Treatment: 11 Clinic Level of Care Assessment  Items TOOL 4 Quantity Score X- 1 0 Use when only an EandM is performed on FOLLOW-UP visit ASSESSMENTS - Nursing Assessment / Reassessment X- 1 10 Reassessment of Co-morbidities (includes updates in patient status) X- 1 5 Reassessment of Adherence to Treatment Plan ASSESSMENTS - Wound and Skin A ssessment / Reassessment X - Simple Wound Assessment / Reassessment - one wound 1 5 []  - 0 Complex Wound Assessment / Reassessment - multiple wounds []  - 0 Dermatologic / Skin Assessment (not related to wound area) ASSESSMENTS - Focused Assessment X- 1 5 Circumferential Edema Measurements - multi extremities []  - 0 Nutritional Assessment / Counseling / Intervention []  - 0 Lower Extremity Assessment (monofilament, tuning fork, pulses) []  - 0 Peripheral Arterial Disease Assessment (using hand held doppler) ASSESSMENTS - Ostomy and/or Continence Assessment and Care []  - 0 Incontinence Assessment and Management []  - 0 Ostomy Care Assessment and Management (repouching, etc.) PROCESS - Coordination of Care X - Simple Patient / Family Education for ongoing care 1 15 []  - 0 Complex (extensive) Patient / Family Education for ongoing care X- 1 10 Staff obtains Programmer, systems, Records, T Results / Process Orders est []  - 0 Staff telephones HHA, Nursing Homes / Clarify orders / etc []  - 0 Routine Transfer to another Facility (non-emergent condition) []  - 0 Routine Hospital Admission (non-emergent condition) []  - 0 New Admissions / Biomedical engineer / Ordering NPWT Apligraf, etc. , []  - 0 Emergency Hospital Admission (emergent condition) X- 1 10 Simple Discharge Coordination []  - 0 Complex (extensive) Discharge Coordination PROCESS - Special Needs []  - 0 Pediatric / Minor Patient Management []  - 0 Isolation Patient Management []  - 0 Hearing / Language / Visual special needs []  - 0 Assessment of Community assistance (transportation, D/C planning, etc.) []  - 0 Additional  assistance / Altered mentation []  - 0 Support Surface(s) Assessment (bed, cushion, seat, etc.) INTERVENTIONS - Wound Cleansing / Measurement X - Simple Wound Cleansing - one  wound 1 5 []  - 0 Complex Wound Cleansing - multiple wounds X- 1 5 Wound Imaging (photographs - any number of wounds) []  - 0 Wound Tracing (instead of photographs) X- 1 5 Simple Wound Measurement - one wound []  - 0 Complex Wound Measurement - multiple wounds INTERVENTIONS - Wound Dressings []  - 0 Small Wound Dressing one or multiple wounds []  - 0 Medium Wound Dressing one or multiple wounds []  - 0 Large Wound Dressing one or multiple wounds []  - 0 Application of Medications - topical []  - 0 Application of Medications - injection INTERVENTIONS - Miscellaneous []  - 0 External ear exam []  - 0 Specimen Collection (cultures, biopsies, blood, body fluids, etc.) []  - 0 Specimen(s) / Culture(s) sent or taken to Lab for analysis []  - 0 Patient Transfer (multiple staff / Civil Service fast streamer / Similar devices) []  - 0 Simple Staple / Suture removal (25 or less) []  - 0 Complex Staple / Suture removal (26 or more) []  - 0 Hypo / Hyperglycemic Management (close monitor of Blood Glucose) []  - 0 Ankle / Brachial Index (ABI) - do not check if billed separately X- 1 5 Vital Signs Has the patient been seen at the hospital within the last three years: Yes Total Score: 80 Level Of Care: New/Established - Level 3 Electronic Signature(s) Signed: 07/28/2020 1:33:30 PM By: Kela Millin Entered By: Kela Millin on 07/25/2020 09:25:55 -------------------------------------------------------------------------------- Encounter Discharge Information Details Patient Name: Date of Service: Leonard Dunning A. 07/25/2020 9:00 A M Medical Record Number: [id number] Patient Account Number: [id number] Date of Birth/Sex: Treating RN: [birth year] (59 y.o. [name] Primary Care Chimamanda Siegfried: [name] Other  Clinician: Referring Kalle Bernath: Treating Lavaughn Haberle/Extender: [name] in Treatment: 11 Encounter Discharge Information Items Discharge Condition: Stable Ambulatory Status: Cane Discharge Destination: Home Transportation: Other Accompanied By: self Schedule Follow-up Appointment: No Clinical Summary of Care: Patient Declined Electronic Signature(s) Signed: 07/28/2020 1:33:30 PM By: Kela Millin Entered By: Kela Millin on 07/25/2020 09:34:06 -------------------------------------------------------------------------------- Lower Extremity Assessment Details Patient Name: Date of Service: Leonard Dunning A. 07/25/2020 9:00 A M Medical Record Number: [id number] Patient Account Number: [id number] Date of Birth/Sex: Treating RN: Feb 11, 1961 (59 y.o. [name] Primary Care Annete Ayuso: [name] Other Clinician: Referring Deirdre Gryder: Treating Bessie Livingood/Extender: Tomasa Blase, Paulette Blanch in Treatment: 11 Edema Assessment Assessed: [Left: No] [Right: No] Edema: [Left: Ye] [Right: s] Calf Left: Right: Point of Measurement: 31 cm From Medial Instep 35 cm cm Ankle Left: Right: Point of Measurement: 11 cm From Medial Instep 24.8 cm cm Vascular Assessment Pulses: Dorsalis Pedis Palpable: [Left:Yes] Electronic Signature(s) Signed: 07/28/2020 1:33:30 PM By: Kela Millin Entered By: Kela Millin on 07/25/2020 09:33:37 -------------------------------------------------------------------------------- Multi Wound Chart Details Patient Name: Date of Service: Leonard Dunning A. 07/25/2020 9:00 A M Medical Record Number: [id number] Patient Account Number: [id number] Date of Birth/Sex: Treating RN: 23-Jun-1961 (59 y.o. [name] Primary Care Rockne Dearinger: [name] Other Clinician: Referring Adah Stoneberg: Treating Dora Clauss/Extender: [name] in Treatment: 11 Vital Signs Height(in):  69 Pulse(bpm): 74 Weight(lbs): 190 Blood Pressure(mmHg): 121/78 Body Mass Index(BMI): 28 Temperature(F): 97.5 Respiratory Rate(breaths/min): 18 Photos: [1:No Photos Left Amputation Site - Transmetatarsal N/A] [N/A:N/A] Wound Location: [1:Surgical Injury] [N/A:N/A] Wounding Event: [1:Dehisced Wound] [N/A:N/A] Primary Etiology: [1:Anemia, Arrhythmia, Congestive Heart N/A] Comorbid History: [1:Failure, Hypertension, Osteoarthritis, Osteomyelitis, Neuropathy, Received Chemotherapy 03/21/2020] [N/A:N/A] Date Acquired: [1:11] [N/A:N/A] Weeks of Treatment: [1:Healed - Epithelialized] [N/A:N/A] Wound Status: [1:0x0x0] [N/A:N/A] Measurements L x W x D (cm) [1:0] [  N/A:N/A] A (cm) : rea [1:0] [N/A:N/A] Volume (cm) : [1:100.00%] [N/A:N/A] % Reduction in Area: [1:100.00%] [N/A:N/A] % Reduction in Volume: [1:Full Thickness With Exposed Support N/A] Classification: [1:Structures None Present] [N/A:N/A] Exudate Amount: [1:Flat and Intact] [N/A:N/A] Wound Margin: [1:None Present (0%)] [N/A:N/A] Granulation Amount: [1:None Present (0%)] [N/A:N/A] Necrotic Amount: [1:Fascia: No] [N/A:N/A] Exposed Structures: [1:Fat Layer (Subcutaneous Tissue): No Tendon: No Muscle: No Joint: No Bone: No Large (67-100%)] [N/A:N/A] Treatment Notes Electronic Signature(s) Signed: 07/28/2020 8:08:43 AM By: Linton Ham MD Signed: 07/28/2020 1:33:30 PM By: Kela Millin Entered By: Linton Ham on 07/25/2020 09:35:03 -------------------------------------------------------------------------------- Multi-Disciplinary Care Plan Details Patient Name: Date of Service: Leonard Dunning A. 07/25/2020 9:00 A M Medical Record Number: [id number] Patient Account Number: [id number] Date of Birth/Sex: Treating RN: 04/21/1961 (59 y.o. [name] Primary Care Suhaila Troiano: [name] Other Clinician: Referring Amylah Will: Treating Catlin Aycock/Extender: [name] in Treatment: 11 Active  Inactive Electronic Signature(s) Signed: 07/28/2020 1:33:30 PM By: Kela Millin Entered By: Kela Millin on 07/25/2020 09:24:14 -------------------------------------------------------------------------------- Pain Assessment Details Patient Name: Date of Service: Leonard Dunning A. 07/25/2020 9:00 A M Medical Record Number: [id number] Patient Account Number: [id number] Date of Birth/Sex: Treating RN: 07-13-1961 (59 y.o. [name] Primary Care Lela Murfin: [name] Other Clinician: Referring Jarelis Ehlert: Treating Gavyn Ybarra/Extender: [name] in Treatment: 11 Active Problems Location of Pain Severity and Description of Pain Patient Has Paino No Site Locations Pain Management and Medication Current Pain Management: Electronic Signature(s) Signed: 07/28/2020 8:02:18 AM By: Sandre Kitty Signed: 07/28/2020 1:33:30 PM By: Kela Millin Entered By: Sandre Kitty on 07/25/2020 08:54:33 -------------------------------------------------------------------------------- Patient/Caregiver Education Details Patient Name: Date of Service: Leonard Bentley 9/17/2021andnbsp9:00 A M Medical Record Number: [id number] Patient Account Number: [id number] Date of Birth/Gender: Treating RN: 1961/09/14 (59 y.o. [name] Primary Care Physician: [name] Other Clinician: Referring Physician: Treating Physician/Extender: [name] in Treatment: 11 Education Assessment Education Provided To: Patient Education Topics Provided Wound/Skin Impairment: Handouts: Caring for Your Ulcer Methods: Explain/Verbal Responses: State content correctly Electronic Signature(s) Signed: 07/28/2020 1:33:30 PM By: Kela Millin Entered By: Kela Millin on 07/25/2020 08:50:36 -------------------------------------------------------------------------------- Wound Assessment Details Patient Name: Date of  Service: Leonard Dunning A. 07/25/2020 9:00 A M Medical Record Number: [id number] Patient Account Number: [id number] Date of Birth/Sex: Treating RN: 1960/11/17 (59 y.o. Jerilynn Mages) Carlene Coria Primary Care Micheal Murad: [name] Other Clinician: Referring Essynce Munsch: Treating Syona Wroblewski/Extender: [name] in Treatment: 11 Wound Status Wound Number: 1 Primary Dehisced Wound Etiology: Wound Location: Left Amputation Site - Transmetatarsal Wound Healed - Epithelialized Wounding Event: Surgical Injury Status: Date Acquired: 03/21/2020 Comorbid Anemia, Arrhythmia, Congestive Heart Failure, Hypertension, Weeks Of Treatment: 11 History: Osteoarthritis, Osteomyelitis, Neuropathy, Received Chemotherapy Clustered Wound: No Wound Measurements Length: (cm) Width: (cm) Depth: (cm) Area: (cm) Volume: (cm) 0 % Reduction in Area: 100% 0 % Reduction in Volume: 100% 0 Epithelialization: Large (67-100%) 0 Tunneling: No 0 Undermining: No Wound Description Classification: Full Thickness With Exposed Support Structures Wound Margin: Flat and Intact Exudate Amount: None Present Foul Odor After Cleansing: No Slough/Fibrino No Wound Bed Granulation Amount: None Present (0%) Exposed Structure Necrotic Amount: None Present (0%) Fascia Exposed: No Fat Layer (Subcutaneous Tissue) Exposed: No Tendon Exposed: No Muscle Exposed: No Joint Exposed: No Bone Exposed: No Electronic Signature(s) Signed: 07/28/2020 1:15:48 PM By: Carlene Coria RN Signed: 07/28/2020 1:33:30 PM By: Kela Millin Entered By: Kela Millin on 07/25/2020 09:23:52 -------------------------------------------------------------------------------- [place] Details Patient Name: Date  of Service: Leonard Bentley 07/25/2020 9:00 A M Medical Record Number: [id number] Patient Account Number: [id number] Date of Birth/Sex: Treating RN: 09-21-1961 (59 y.o. [name] Primary Care Illona Bulman: [name] Other Clinician: Referring Wahid Holley: Treating Emmaline Wahba/Extender: [name] in Treatment: 11 Vital Signs Time Taken: 08:54 Temperature (F): 97.5 Height (in): 69 Pulse (bpm): 74 Weight (lbs): 190 Respiratory Rate (breaths/min): 18 Body Mass Index (BMI): 28.1 Blood Pressure (mmHg): 121/78 Reference Range: 80 - 120 mg / dl Electronic Signature(s) Signed: 07/28/2020 8:02:18 AM By: Sandre Kitty Entered By: Sandre Kitty on 07/25/2020 08:54:27

## 2020-08-08 ENCOUNTER — Ambulatory Visit: Payer: Self-pay | Admitting: Nurse Practitioner

## 2020-08-20 ENCOUNTER — Ambulatory Visit: Payer: Self-pay | Admitting: Pharmacist

## 2020-08-22 ENCOUNTER — Ambulatory Visit: Payer: Medicaid Other | Attending: Nurse Practitioner | Admitting: Pharmacist

## 2020-08-22 ENCOUNTER — Other Ambulatory Visit: Payer: Self-pay

## 2020-08-22 DIAGNOSIS — Z9889 Other specified postprocedural states: Secondary | ICD-10-CM | POA: Diagnosis not present

## 2020-08-22 DIAGNOSIS — Z86711 Personal history of pulmonary embolism: Secondary | ICD-10-CM

## 2020-08-22 LAB — POCT INR: INR: 2.8 (ref 2.0–3.0)

## 2020-08-22 MED ORDER — WARFARIN SODIUM 5 MG PO TABS
ORAL_TABLET | ORAL | 2 refills | Status: DC
Start: 2020-08-22 — End: 2020-08-22

## 2020-08-22 MED ORDER — WARFARIN SODIUM 5 MG PO TABS: ORAL_TABLET | ORAL | 2 refills | Status: DC

## 2020-08-22 NOTE — Addendum Note (Signed)
Addended by: Daisy Blossom, Amelita Risinger L on: 08/22/2020 11:15 AM   Modules accepted: Orders

## 2020-09-01 ENCOUNTER — Telehealth: Payer: Self-pay | Admitting: Internal Medicine

## 2020-09-01 NOTE — Telephone Encounter (Signed)
Moved 11/10 appt to later time of [place] to accommodate schedule of MD. Called, not able to leave msg. Mailed printout

## 2020-09-09 NOTE — Progress Notes (Signed)
[facility] OFFICE PROGRESS NOTE  [name], NP [place] [address]  DIAGNOSIS:  1) Stage II, large B-cell non-Hodgkin's lymphoma diagnosed in September 2012 with a scalp lesion. In addition he had mediastinal lymphadenopathy.  2) bilateral pulmonary embolism diagnosed on CT scan of the chest on 12/28/2012.   PRIOR THERAPY: 1) Systemic chemotherapy with CHOP/Rituxan, given every 3 weeks with Neulasta support, status post 6 cycles, last dose was given 12/01/2011 with complete response.  2) Xarelto 20 mg by mouth a [place].  CURRENT THERAPY: Observation.  INTERVAL HISTORY: Leonard Bentley 59 y.o. male returns to the clinic today for a follow-up visit.  The patient was last seen in the clinic 1 year ago.  The patient completed treatment for his non-Hodgkin's lymphoma in 2013.  He has been on observation since that time and feeling fine. He denies any recent fever, chills, night sweats, unexplained weight loss, or lymphadenopathy.  He denies any recent signs and symptoms of infection including nasal congestion, sore throat, cough, skin infections, or dysuria except he was treated for a UTI in September 2021.  The patient denies any abnormal bleeding or bruising.  He denies any abdominal pain or early satiety. The patient recently had a restaging CT scan performed.  He is here today for evaluation and to review his scan results.  MEDICAL HISTORY: Past Medical History:  Diagnosis Date  . Acute pulmonary embolism ([place]) 04/11/2016  . Anxiety   . Arthritis   . Atrial tachycardia ([place]) 01/06/2016  . Depression   . DTs (delirium tremens) ([place])   . Dyspnea   . ED (erectile dysfunction)   . ETOH abuse   . Hypertension   . Lymphoma, non Hodgkin's 09/25/2011   Stage 2  . Mitral regurgitation 01/20/2016  . Mobitz I 10/09/2015  . Murmur 01/06/2016  . Occasional tremors   . PAC (premature atrial contraction) 10/09/2015  . S/P minimally-invasive mitral valve repair  10/27/2016   Complex valvuloplasty including artificial Gore-tex neochord placement x6 and 34 mm Edwards Physio II ring annuloplasty via right mini thoracotomy approach    ALLERGIES:  is allergic to other.  MEDICATIONS:  Current Outpatient Medications  Medication Sig Dispense Refill  . ALPRAZolam (XANAX) 0.25 MG tablet Take 1 tablet (0.25 mg total) by mouth 2 (two) times daily as needed for anxiety. 60 tablet 0  . atorvastatin (LIPITOR) 20 MG tablet Take 1 tablet (20 mg total) by mouth daily. 90 tablet 3  . collagenase (SANTYL) ointment Apply 1 application topically daily. 90 g 0  . gabapentin (NEURONTIN) 100 MG capsule Take 2 capsules (200 mg total) by mouth 3 (three) times daily. 180 capsule 2  . metoprolol succinate (TOPROL-XL) 25 MG 24 hr tablet TAKE 1/2 TABLET(12.5 MG) BY MOUTH DAILY 45 tablet 1  . nicotine (NICODERM CQ - DOSED IN MG/24 HOURS) 21 mg/24hr patch Place 1 patch (21 mg total) onto the skin daily. (Patient not taking: Reported on 06/16/2020) 28 patch 0  . oxyCODONE (OXY IR/ROXICODONE) 5 MG immediate release tablet Take 1 tablet (5 mg total) by mouth every 6 (six) hours as needed for moderate pain (pain score 4-6). 15 tablet 0  . warfarin (COUMADIN) 5 MG tablet TAKE AS DIRECTED BY COUMADIN CLINIC 60 tablet 2   No current facility-administered medications for this visit.    SURGICAL HISTORY:  Past Surgical History:  Procedure Laterality Date  . AMPUTATION Left 01/12/2019   Procedure: LEFT GREAT TOE AMPUTATION;  Surgeon: [name],  Illene Regulus, MD;  Location: [facility];  Service: Orthopedics;  Laterality: Left;  . AMPUTATION Left 01/16/2020   Procedure: LEFT 1ST METATARSAL AND SECOND TOE AMPUTATION;  Surgeon: [name], MD;  Location: [facility];  Service: Orthopedics;  Laterality: Left;  . AMPUTATION Left 01/30/2020   Procedure: LEFT FOOT 3RD RAY AMPUTATION;  Surgeon: [name], MD;  Location: [facility];  Service: Orthopedics;  Laterality: Left;  . AMPUTATION Left 03/21/2020   Procedure:  MIDFOOT AMPUTATION; LEFT;  Surgeon: [name], MD;  Location: [facility];  Service: Orthopedics;  Laterality: Left;  . CARDIAC CATHETERIZATION N/A 08/13/2016   Procedure: Right/Left Heart Cath and Coronary Angiography;  Surgeon: [name], MD;  Location: [facility];  Service: Cardiovascular;  Laterality: N/A;  . MITRAL VALVE REPAIR Right 10/27/2016   Procedure: MINIMALLY INVASIVE MITRAL VALVE REPAIR (MVR) USING 34 PHYSIO II ANNULOPLASTY RING;  Surgeon: [name], MD;  Location: [facility];  Service: Open Heart Surgery;  Laterality: Right;  . SKIN BIOPSY Right 04/04/2018   right mid medial anterior tibial shave  see report in chart  . TEE WITHOUT CARDIOVERSION N/A 02/11/2016   Procedure: TRANSESOPHAGEAL ECHOCARDIOGRAM (TEE);  Surgeon: [name], MD;  Location: [facility];  Service: Cardiovascular;  Laterality: N/A;  . TEE WITHOUT CARDIOVERSION N/A 10/27/2016   Procedure: TRANSESOPHAGEAL ECHOCARDIOGRAM (TEE);  Surgeon: [name], MD;  Location: [facility];  Service: Open Heart Surgery;  Laterality: N/A;  . TRIGGER FINGER RELEASE Bilateral     REVIEW OF SYSTEMS:   Review of Systems  Constitutional: Negative for appetite change, chills, fatigue, fever and unexpected weight change.  HENT: Negative for mouth sores, nosebleeds, sore throat and trouble swallowing.   Eyes: Negative for eye problems and icterus.  Respiratory: Negative for cough, hemoptysis, shortness of breath and wheezing.   Cardiovascular: Negative for chest pain and leg swelling.  Gastrointestinal: Negative for abdominal pain, constipation, diarrhea, nausea and vomiting.  Genitourinary: Negative for bladder incontinence, difficulty urinating, dysuria, frequency and hematuria.   Musculoskeletal: Negative for back pain, gait problem, neck pain and neck stiffness.  Skin: Negative for itching and rash.  Neurological: Negative for dizziness, extremity weakness, gait problem, headaches, light-headedness and  seizures.  Hematological: Negative for adenopathy. Does not bruise/bleed easily.  Psychiatric/Behavioral: Negative for confusion, depression and sleep disturbance. The patient is not nervous/anxious.     PHYSICAL EXAMINATION:  Blood pressure 107/78, pulse (!) 113, temperature 98.2 F (36.8 C), temperature source Tympanic, resp. rate 18, height 5\' 9"  (1.753 m), weight 179 lb 4.8 oz (81.3 kg), SpO2 100 %.  ECOG PERFORMANCE STATUS: 1 - Symptomatic but completely ambulatory  Physical Exam  Constitutional: Oriented to person, place, and time and well-developed, well-nourished, and in no distress.  HENT:  Head: Normocephalic and atraumatic.  Mouth/Throat: Oropharynx is clear and moist. No oropharyngeal exudate.  Eyes: Conjunctivae are normal. Right eye exhibits no discharge. Left eye exhibits no discharge. No scleral icterus.  Neck: Normal range of motion. Neck supple.  Cardiovascular: Normal rate, regular rhythm, normal heart sounds and intact distal pulses.   Pulmonary/Chest: Effort normal and breath sounds normal. No respiratory distress. No wheezes. No rales.  Abdominal: Soft. Bowel sounds are normal. Exhibits no distension and no mass. There is no tenderness.  Musculoskeletal: Normal range of motion. Exhibits no edema.  Lymphadenopathy:    No cervical adenopathy.  Neurological: Alert and oriented to person, place, and time. Exhibits normal muscle tone. Uses a walker for ambulation. Patient wearing  a boot on left foot.  Skin: Skin is warm and dry. No rash noted. Not diaphoretic. No erythema. No pallor. seborrheic  keratosis on back.  Psychiatric: Mood, memory and judgment normal.  Vitals reviewed.  LABORATORY DATA: Lab Results  Component Value Date   WBC 3.3 (L) 09/15/2020   HGB 11.6 (L) 09/15/2020   HCT 36.2 (L) 09/15/2020   MCV 76.1 (L) 09/15/2020   PLT 135 (L) 09/15/2020      Chemistry      Component Value Date/Time   NA 136 09/15/2020 0949   NA 138 08/03/2019 1121   NA  140 09/21/2017 0825   K 3.2 (L) 09/15/2020 0949   K 4.5 09/21/2017 0825   CL 101 09/15/2020 0949   CL 98 12/28/2012 0824   CO2 25 09/15/2020 0949   CO2 27 09/21/2017 0825   BUN 13 09/15/2020 0949   BUN 14 08/03/2019 1121   BUN 18.1 09/21/2017 0825   CREATININE 1.08 09/15/2020 0949   CREATININE 1.0 09/21/2017 0825      Component Value Date/Time   CALCIUM 9.1 09/15/2020 0949   CALCIUM 9.8 09/21/2017 0825   ALKPHOS 59 09/15/2020 0949   ALKPHOS 63 09/21/2017 0825   AST 23 09/15/2020 0949   AST 58 (H) 09/21/2017 0825   ALT 13 09/15/2020 0949   ALT 37 09/21/2017 0825   BILITOT 1.0 09/15/2020 0949   BILITOT 0.62 09/21/2017 0825       RADIOGRAPHIC STUDIES:  CT Chest W Contrast  Result Date: 09/16/2020 CLINICAL DATA:  Non-Hodgkin's lymphoma. EXAM: CT CHEST, ABDOMEN, AND PELVIS WITH CONTRAST TECHNIQUE: Multidetector CT imaging of the chest, abdomen and pelvis was performed following the standard protocol during bolus administration of intravenous contrast. CONTRAST:  161mL OMNIPAQUE IOHEXOL 300 MG/ML  SOLN COMPARISON:  Chest CT 02/23/2019 and CT abdomen 09/21/2017 FINDINGS: CT CHEST FINDINGS Cardiovascular: The heart is normal in size. No pericardial effusion. The aorta is normal in caliber. No dissection. No atherosclerotic calcifications. The branch vessels are patent. A few scattered coronary artery calcifications are noted. Prosthetic mitral valve is noted. Mediastinum/Nodes: There are partially calcified mediastinal and hilar lymph nodes. Interval decrease in size when compared to the prior study. 12.5 mm right hilar node on image 24/2 previously measured 14.5 mm. 9 mm left hilar node on image 25/2 previously measured 11.5 mm. 9 mm prevascular node on image 25/2 previously measured 9.5 mm. No new or progressive findings are identified. Lungs/Pleura: Chronic miliary appearing micro nodularity pattern in both lungs. Findings could be due to biliary sarcoidosis, silicosis, hypersensitivity  pneumonitis or pulmonary alveolar proteinosis. No infiltrates or effusions. Stable partially calcified density at the right lung base. Musculoskeletal: No chest wall mass, supraclavicular or axillary adenopathy. The thyroid gland appears normal. The bony thorax is intact. CT ABDOMEN PELVIS FINDINGS Hepatobiliary: Numerous scattered hepatic cysts. No worrisome hepatic lesions or intrahepatic biliary dilatation. A few calcified granulomas are noted. The gallbladder is normal. No common bile duct dilatation. Pancreas: No mass, inflammation or ductal dilatation. Spleen: Normal size. No focal lesions. Adrenals/Urinary Tract: The adrenal glands and kidneys are unremarkable. No renal lesions or hydronephrosis. The bladder is normal. Stomach/Bowel: The stomach, duodenum, small bowel and colon are unremarkable. No acute inflammatory changes, mass lesions or obstructive findings. Vascular/Lymphatic: Moderate tortuosity of the abdominal aorta but no aneurysm or dissection. The branch vessels are patent. There is mild fusiform dilatation of the left common iliac artery measures a maximum of 2.3 cm. There are also atherosclerotic calcifications involving both iliac  arteries. No mesenteric or retroperitoneal mass or lymphadenopathy. No pelvic lymphadenopathy or inguinal lymphadenopathy. Reproductive: The prostate gland and seminal vesicles are unremarkable. Other: No pelvic mass or adenopathy. No free pelvic fluid collections. No inguinal mass or adenopathy. No abdominal wall hernia or subcutaneous lesions. Musculoskeletal: No significant bony findings. IMPRESSION: 1. Interval decrease in size of the partially calcified mediastinal and hilar lymph nodes. No new or progressive findings. 2. Chronic miliary appearing micronodularity pattern in both lungs as detailed above. 3. No acute abdominal/pelvic findings, mass lesions or adenopathy. 4. Numerous scattered hepatic cysts. Electronically Signed   By: Marijo Sanes M.D.   On:  09/16/2020 10:25   CT Abdomen Pelvis W Contrast  Result Date: 09/16/2020 CLINICAL DATA:  Non-Hodgkin's lymphoma. EXAM: CT CHEST, ABDOMEN, AND PELVIS WITH CONTRAST TECHNIQUE: Multidetector CT imaging of the chest, abdomen and pelvis was performed following the standard protocol during bolus administration of intravenous contrast. CONTRAST:  160mL OMNIPAQUE IOHEXOL 300 MG/ML  SOLN COMPARISON:  Chest CT 02/23/2019 and CT abdomen 09/21/2017 FINDINGS: CT CHEST FINDINGS Cardiovascular: The heart is normal in size. No pericardial effusion. The aorta is normal in caliber. No dissection. No atherosclerotic calcifications. The branch vessels are patent. A few scattered coronary artery calcifications are noted. Prosthetic mitral valve is noted. Mediastinum/Nodes: There are partially calcified mediastinal and hilar lymph nodes. Interval decrease in size when compared to the prior study. 12.5 mm right hilar node on image 24/2 previously measured 14.5 mm. 9 mm left hilar node on image 25/2 previously measured 11.5 mm. 9 mm prevascular node on image 25/2 previously measured 9.5 mm. No new or progressive findings are identified. Lungs/Pleura: Chronic miliary appearing micro nodularity pattern in both lungs. Findings could be due to biliary sarcoidosis, silicosis, hypersensitivity pneumonitis or pulmonary alveolar proteinosis. No infiltrates or effusions. Stable partially calcified density at the right lung base. Musculoskeletal: No chest wall mass, supraclavicular or axillary adenopathy. The thyroid gland appears normal. The bony thorax is intact. CT ABDOMEN PELVIS FINDINGS Hepatobiliary: Numerous scattered hepatic cysts. No worrisome hepatic lesions or intrahepatic biliary dilatation. A few calcified granulomas are noted. The gallbladder is normal. No common bile duct dilatation. Pancreas: No mass, inflammation or ductal dilatation. Spleen: Normal size. No focal lesions. Adrenals/Urinary Tract: The adrenal glands and kidneys  are unremarkable. No renal lesions or hydronephrosis. The bladder is normal. Stomach/Bowel: The stomach, duodenum, small bowel and colon are unremarkable. No acute inflammatory changes, mass lesions or obstructive findings. Vascular/Lymphatic: Moderate tortuosity of the abdominal aorta but no aneurysm or dissection. The branch vessels are patent. There is mild fusiform dilatation of the left common iliac artery measures a maximum of 2.3 cm. There are also atherosclerotic calcifications involving both iliac arteries. No mesenteric or retroperitoneal mass or lymphadenopathy. No pelvic lymphadenopathy or inguinal lymphadenopathy. Reproductive: The prostate gland and seminal vesicles are unremarkable. Other: No pelvic mass or adenopathy. No free pelvic fluid collections. No inguinal mass or adenopathy. No abdominal wall hernia or subcutaneous lesions. Musculoskeletal: No significant bony findings. IMPRESSION: 1. Interval decrease in size of the partially calcified mediastinal and hilar lymph nodes. No new or progressive findings. 2. Chronic miliary appearing micronodularity pattern in both lungs as detailed above. 3. No acute abdominal/pelvic findings, mass lesions or adenopathy. 4. Numerous scattered hepatic cysts. Electronically Signed   By: Marijo Sanes M.D.   On: 09/16/2020 10:25     ASSESSMENT/PLAN:  This is a very pleasant 59 year old African-American male with a history of stage II large B cell non-Hodgkin's lymphoma.  The  patient was diagnosed in 2020.  He presented with a scalp lesion and mediastinal lymphadenopathy.  The patient is status post 6 cycles of R-CHOP which was completed in 2013.  The patient has been on observation since that time.  The patient was seen with Dr. Julien Nordmann today.  The patient recently had a restaging CT scan performed.  Dr. Julien Nordmann personally and independently reviewed the scan and discussed the results with the patient today.  The scan did not show evidence for disease  recurrence.  Dr. Julien Nordmann recommends that the patient continue on observation.   Dr. Earlie Server recommends that the patient continue on observation with repeat blood work in 1 year.  The patient was given a handout on potassium rich food due to his slight hypokalemia.   The patient was advised to call immediately if he has any concerning symptoms in the interval. The patient voices understanding of current disease status and treatment options and is in agreement with the current care plan. All questions were answered. The patient knows to call the clinic with any problems, questions or concerns. We can certainly see the patient much sooner if necessary    Orders Placed This Encounter  Procedures  . CBC with Differential (Cancer Center Only)    Standing Status:   Future    Standing Expiration Date:   09/17/2021  . CMP ([facility] only)    Standing Status:   Future    Standing Expiration Date:   09/17/2021     Tobe Sos Levora Werden, PA-C 09/17/20  ADDENDUM: Hematology/Oncology Attending: I had a face-to-face encounter with the patient.  I recommended his care plan.  This is a very pleasant 59 years old African-American male with history of stage II non-Hodgkin lymphoma status post systemic chemotherapy with 6 cycles of CHOP R completed in 2013.  The patient has been on observation since that time. He had repeat CT scan of the chest, abdomen pelvis performed recently.  I personally and independently reviewed the scans and discussed the results with the patient today. His scan showed no concerning findings for disease recurrence. I recommended for him to continue on observation with repeat blood work. He was advised to call immediately if he has any concerning symptoms in the interval.  Disclaimer: This note was dictated with voice recognition software. Similar sounding words can inadvertently be transcribed and may be missed upon review. Eilleen Kempf, MD 09/19/20

## 2020-09-15 ENCOUNTER — Inpatient Hospital Stay: Payer: Medicaid Other | Attending: Physician Assistant

## 2020-09-15 ENCOUNTER — Ambulatory Visit (HOSPITAL_COMMUNITY)
Admission: RE | Admit: 2020-09-15 | Discharge: 2020-09-15 | Disposition: A | Discharge status: Home / Self Care | Payer: Medicaid Other | Source: Ambulatory Visit | Attending: Internal Medicine | Admitting: Internal Medicine

## 2020-09-15 ENCOUNTER — Encounter (HOSPITAL_COMMUNITY): Payer: Self-pay

## 2020-09-15 ENCOUNTER — Other Ambulatory Visit: Payer: Self-pay

## 2020-09-15 DIAGNOSIS — Z8572 Personal history of non-Hodgkin lymphomas: Secondary | ICD-10-CM | POA: Insufficient documentation

## 2020-09-15 DIAGNOSIS — Z9221 Personal history of antineoplastic chemotherapy: Secondary | ICD-10-CM | POA: Insufficient documentation

## 2020-09-15 LAB — CBC WITH DIFFERENTIAL (CANCER CENTER ONLY)
Abs Immature Granulocytes: 0.01 10*3/uL (ref 0.00–0.07)
Basophils Absolute: 0 10*3/uL (ref 0.0–0.1)
Basophils Relative: 1 %
Eosinophils Absolute: 0.2 10*3/uL (ref 0.0–0.5)
Eosinophils Relative: 5 %
HCT: 36.2 % — ABNORMAL LOW (ref 39.0–52.0)
Hemoglobin: 11.6 g/dL — ABNORMAL LOW (ref 13.0–17.0)
Immature Granulocytes: 0 %
Lymphocytes Relative: 11 %
Lymphs Abs: 0.4 10*3/uL — ABNORMAL LOW (ref 0.7–4.0)
MCH: 24.4 pg — ABNORMAL LOW (ref 26.0–34.0)
MCHC: 32 g/dL (ref 30.0–36.0)
MCV: 76.1 fL — ABNORMAL LOW (ref 80.0–100.0)
Monocytes Absolute: 0.4 10*3/uL (ref 0.1–1.0)
Monocytes Relative: 13 %
Neutro Abs: 2.3 10*3/uL (ref 1.7–7.7)
Neutrophils Relative %: 70 %
Platelet Count: 135 10*3/uL — ABNORMAL LOW (ref 150–400)
RBC: 4.76 MIL/uL (ref 4.22–5.81)
RDW: 17.1 % — ABNORMAL HIGH (ref 11.5–15.5)
WBC Count: 3.3 10*3/uL — ABNORMAL LOW (ref 4.0–10.5)
nRBC: 0.9 % — ABNORMAL HIGH (ref 0.0–0.2)

## 2020-09-15 LAB — CMP (CANCER CENTER ONLY)
ALT: 13 U/L (ref 0–44)
AST: 23 U/L (ref 15–41)
Albumin: 3.8 g/dL (ref 3.5–5.0)
Alkaline Phosphatase: 59 U/L (ref 38–126)
Anion gap: 10 (ref 5–15)
BUN: 13 mg/dL (ref 6–20)
CO2: 25 mmol/L (ref 22–32)
Calcium: 9.1 mg/dL (ref 8.9–10.3)
Chloride: 101 mmol/L (ref 98–111)
Creatinine: 1.08 mg/dL (ref 0.61–1.24)
GFR, Estimated: >60 mL/min (ref >60)
Glucose, Bld: 106 mg/dL — ABNORMAL HIGH (ref 70–99)
Potassium: 3.2 mmol/L — ABNORMAL LOW (ref 3.5–5.1)
Sodium: 136 mmol/L (ref 135–145)
Total Bilirubin: 1 mg/dL (ref 0.3–1.2)
Total Protein: 7.2 g/dL (ref 6.5–8.1)

## 2020-09-15 LAB — LACTATE DEHYDROGENASE: LDH: 193 U/L — ABNORMAL HIGH (ref 98–192)

## 2020-09-15 MED ORDER — IOHEXOL 300 MG/ML  SOLN
100.0000 mL | Freq: Once | INTRAMUSCULAR | Status: AC | PRN
  Administered 2020-09-15: 100 mL via INTRAVENOUS

## 2020-09-17 ENCOUNTER — Inpatient Hospital Stay (HOSPITAL_BASED_OUTPATIENT_CLINIC_OR_DEPARTMENT_OTHER): Payer: Medicaid Other | Admitting: Physician Assistant

## 2020-09-17 ENCOUNTER — Other Ambulatory Visit: Payer: Self-pay

## 2020-09-17 ENCOUNTER — Ambulatory Visit: Payer: Self-pay | Admitting: Internal Medicine

## 2020-09-17 ENCOUNTER — Encounter: Payer: Self-pay | Admitting: Physician Assistant

## 2020-09-17 VITALS — BP 107/78 | HR 113 | Temp 98.2°F | Resp 18 | Ht 69.0 in | Wt 179.3 lb

## 2020-09-17 DIAGNOSIS — Z8572 Personal history of non-Hodgkin lymphomas: Secondary | ICD-10-CM

## 2020-09-18 ENCOUNTER — Telehealth: Payer: Self-pay | Admitting: Physician Assistant

## 2020-09-18 NOTE — Telephone Encounter (Signed)
Scheduled per los. Called and left msg. Mailed printout  °

## 2020-09-19 ENCOUNTER — Encounter: Payer: Self-pay | Admitting: Physician Assistant

## 2020-09-24 ENCOUNTER — Ambulatory Visit: Payer: Medicaid Other | Admitting: Pharmacist

## 2020-11-05 ENCOUNTER — Telehealth: Payer: Self-pay | Admitting: Nurse Practitioner

## 2020-11-05 NOTE — Telephone Encounter (Signed)
I will be out of office for the rest of this week. Patient's INR was perfect in October. He needs to schedule a Coumadin Clinic visit. Can we call him and place him on my schedule either next week or the following?

## 2020-11-05 NOTE — Telephone Encounter (Signed)
Copied from CRM [id number]. Topic: Quick Communication - See Telephone Encounter >> Nov 05, 2020  8:41 AM Aretta Nip wrote: CRM for notification. See Telephone encounter for: 11/05/20. Pt is wanting CB from [facility] re his INR levels. FU call @ [phone]

## 2020-11-11 ENCOUNTER — Ambulatory Visit: Payer: Self-pay | Admitting: Pharmacist

## 2020-11-18 ENCOUNTER — Ambulatory Visit: Payer: Medicaid Other | Admitting: Pharmacist

## 2020-11-21 NOTE — Telephone Encounter (Signed)
error 

## 2020-12-12 DIAGNOSIS — Z736 Limitation of activities due to disability: Secondary | ICD-10-CM

## 2020-12-19 ENCOUNTER — Ambulatory Visit: Payer: Medicaid Other | Admitting: Pharmacist

## 2021-01-01 ENCOUNTER — Other Ambulatory Visit: Payer: Self-pay | Admitting: Family Medicine

## 2021-01-01 ENCOUNTER — Telehealth: Payer: Self-pay | Admitting: Nurse Practitioner

## 2021-01-01 MED ORDER — WARFARIN SODIUM 5 MG PO TABS
ORAL_TABLET | ORAL | 0 refills | Status: DC
Start: 2021-01-01 — End: 2021-03-19

## 2021-01-01 NOTE — Telephone Encounter (Signed)
Requested medication (s) are due for refill today: no  Requested medication (s) are on the active medication list: yes  Last refill: 08/01/2020  Future visit scheduled:yes  Notes to clinic:  this refill cannot be delegated    Requested Prescriptions  Pending Prescriptions Disp Refills   warfarin (COUMADIN) 5 MG tablet [Pharmacy Med Name: Warfarin Sodium 5 MG Oral Tablet] 60 tablet 0    Sig: [place]      Hematology:  Anticoagulants - warfarin Failed - 01/01/2021 10:59 AM      Failed - This refill cannot be delegated      Failed - If the patient is managed by Coumadin Clinic - route to their Pool. If not, forward to the provider.      Failed - INR in normal range and within 30 days    INR  Date Value Ref Range Status  08/22/2020 2.8 2.0 - 3.0 Final  04/04/2020 2.0 (H) 0.8 - 1.2 Final    Comment:    (NOTE) INR goal varies based on device and disease states. Performed at [facility], [place] [address], [place], [place] [address]           Failed - Valid encounter within last 3 months    Recent Outpatient Visits           6 months ago Essential hypertension   [place] [name], NP   10 months ago Excessive cerumen in both ear canals   [place], [place], NP   1 year ago GAD (generalized anxiety disorder)   [place] [name], NP   1 year ago [place], Zelda W, NP   1 year ago SVT (supraventricular tachycardia) [facility])   [place], Zelda W, NP       Future Appointments             In 1 week Daisy Blossom, Jarome [place], [place]

## 2021-01-01 NOTE — Telephone Encounter (Signed)
Pt was call and schedule an appt for 01/12/21

## 2021-01-01 NOTE — Telephone Encounter (Signed)
Rx sent 

## 2021-01-01 NOTE — Telephone Encounter (Signed)
Pt need a refill until he see Lucianne Lei for INR on 01/12/21 Please he need warfarin (COUMADIN) 5 MG tablet [[id number]] Please sent it to [place] ([place]), [place] - [address]  [address] [place], [place] ([place]) [place] [address]

## 2021-01-01 NOTE — Telephone Encounter (Signed)
Patient called to schedule an appt. To have his INR checked.  He stated that Daisy Blossom usually schedules it to be checked.  Please advise and call patient to schedule.  CB# [phone]

## 2021-01-01 NOTE — Addendum Note (Signed)
Addended by: Daisy Blossom, Annie Main L on: 01/01/2021 12:04 PM   Modules accepted: Orders

## 2021-01-12 ENCOUNTER — Ambulatory Visit: Payer: Medicaid Other | Attending: Nurse Practitioner | Admitting: Pharmacist

## 2021-01-12 ENCOUNTER — Other Ambulatory Visit: Payer: Self-pay

## 2021-01-12 DIAGNOSIS — Z86711 Personal history of pulmonary embolism: Secondary | ICD-10-CM | POA: Diagnosis not present

## 2021-01-12 DIAGNOSIS — Z9889 Other specified postprocedural states: Secondary | ICD-10-CM

## 2021-01-12 LAB — POCT INR: INR: 1.4 — AB (ref 2.0–3.0)

## 2021-01-27 ENCOUNTER — Ambulatory Visit: Payer: Medicaid Other | Admitting: Pharmacist

## 2021-02-06 ENCOUNTER — Ambulatory Visit: Payer: Medicaid Other | Attending: Nurse Practitioner | Admitting: Pharmacist

## 2021-02-06 ENCOUNTER — Other Ambulatory Visit: Payer: Self-pay

## 2021-02-06 DIAGNOSIS — Z86711 Personal history of pulmonary embolism: Secondary | ICD-10-CM | POA: Diagnosis not present

## 2021-02-06 DIAGNOSIS — Z9889 Other specified postprocedural states: Secondary | ICD-10-CM

## 2021-02-06 LAB — POCT INR: INR: 1.8 — AB (ref 2.0–3.0)

## 2021-02-12 ENCOUNTER — Telehealth: Payer: Self-pay | Admitting: Nurse Practitioner

## 2021-02-12 NOTE — Telephone Encounter (Signed)
Called patient, unable to LVM, because VM full. Calling to let patient know that his appointment with clinical pharmacist had been cancelled due to the provider being out of the office. Patient will need to be rescheduled.

## 2021-02-13 ENCOUNTER — Ambulatory Visit: Payer: Medicaid Other | Admitting: Pharmacist

## 2021-03-19 ENCOUNTER — Other Ambulatory Visit: Payer: Self-pay | Admitting: Nurse Practitioner

## 2021-03-19 MED ORDER — WARFARIN SODIUM 5 MG PO TABS
ORAL_TABLET | ORAL | 0 refills | Status: DC
Start: 2021-03-19 — End: 2021-06-05

## 2021-03-19 NOTE — Telephone Encounter (Signed)
Requested medication (s) are due for refill today: no  Requested medication (s) are on the active medication list: yes  Last refill:  01/01/2021  Future visit scheduled:no  Notes to clinic:  this refill cannot be delegated    Requested Prescriptions  Pending Prescriptions Disp Refills   warfarin (COUMADIN) 5 MG tablet 60 tablet 0    Sig: TAKE AS DIRECTED BY COUMADIN CLINIC      Hematology:  Anticoagulants - warfarin Failed - 03/19/2021 10:54 AM      Failed - This refill cannot be delegated      Failed - If the patient is managed by Coumadin Clinic - route to their Pool. If not, forward to the provider.      Failed - INR in normal range and within 30 days    INR  Date Value Ref Range Status  02/06/2021 1.8 (A) 2.0 - 3.0 Final  04/04/2020 2.0 (H) 0.8 - 1.2 Final    Comment:    (NOTE) INR goal varies based on device and disease states. Performed at [facility], [place] [address], [place], [place] [address]           Failed - Valid encounter within last 3 months    Recent Outpatient Visits           9 months ago Essential hypertension   [place], Vernia Buff, NP   1 year ago Excessive cerumen in both ear canals   [place], [place], NP   1 year ago GAD (generalized anxiety disorder)   [place] [name], NP   1 year ago [place], Vernia Buff, NP   2 years ago SVT (supraventricular tachycardia) [facility])   [place] [name], NP

## 2021-03-19 NOTE — Telephone Encounter (Signed)
Medication Refill - Medication: warfarin (COUMADIN) 5 MG tablet    Has the patient contacted their pharmacy? Yes.  Contact PCP office     Preferred Pharmacy (with phone number or street name):  [place] ([place] - [address]  [address], [place] ([place]) [place] [address]   Agent: Please be advised that RX refills may take up to 3 business days. We ask that you follow-up with your pharmacy.

## 2021-03-20 ENCOUNTER — Telehealth: Payer: Self-pay | Admitting: Nurse Practitioner

## 2021-03-20 NOTE — Telephone Encounter (Signed)
Copied from [place] [phone]. Topic: General - Other >> Mar 19, 2021  5:10 PM P[place], Brayton Layman A wrote: Reason for CRM: [place] called to get some clarification on warfarin (COUMADIN) 5 MG tablet, please call back.

## 2021-03-20 NOTE — Telephone Encounter (Signed)
Leonard Bentley can follow up on this

## 2021-03-23 NOTE — Telephone Encounter (Signed)
Called the pharmacy. The rx is ready for pick-up. The problem has been resolved.

## 2021-03-25 ENCOUNTER — Telehealth: Payer: Self-pay | Admitting: Nurse Practitioner

## 2021-03-25 NOTE — Telephone Encounter (Signed)
Copied from [place] [phone]. Topic: Appointment Scheduling - Scheduling Inquiry for Clinic >> Mar 19, 2021 10:48 AM Oneta Rack wrote: Patient would like to schedule INR check with Lurena Joiner, please follow up  Called patient x2 and unable to LVM because VMF. Calling to schedule patient with [facility] for INR check per patient request.

## 2021-04-16 ENCOUNTER — Ambulatory Visit: Payer: Medicaid Other | Admitting: Pharmacist

## 2021-06-03 ENCOUNTER — Ambulatory Visit: Payer: Medicaid Other | Admitting: Pharmacist

## 2021-06-05 ENCOUNTER — Ambulatory Visit: Payer: Medicaid Other | Attending: Nurse Practitioner | Admitting: Pharmacist

## 2021-06-05 ENCOUNTER — Other Ambulatory Visit: Payer: Self-pay

## 2021-06-05 DIAGNOSIS — Z86711 Personal history of pulmonary embolism: Secondary | ICD-10-CM | POA: Diagnosis not present

## 2021-06-05 DIAGNOSIS — Z9889 Other specified postprocedural states: Secondary | ICD-10-CM

## 2021-06-05 DIAGNOSIS — I10 Essential (primary) hypertension: Secondary | ICD-10-CM

## 2021-06-05 DIAGNOSIS — R200 Anesthesia of skin: Secondary | ICD-10-CM

## 2021-06-05 LAB — POCT INR: INR: 2.9 (ref 2.0–3.0)

## 2021-06-05 MED ORDER — GABAPENTIN 100 MG PO CAPS: 200.0000 mg | ORAL_CAPSULE | Freq: Three times a day (TID) | ORAL | 2 refills | Status: AC

## 2021-06-05 MED ORDER — WARFARIN SODIUM 5 MG PO TABS: ORAL_TABLET | ORAL | 2 refills | Status: AC

## 2021-06-05 MED ORDER — METOPROLOL SUCCINATE ER 25 MG PO TB24: ORAL_TABLET | ORAL | 1 refills | Status: AC

## 2021-07-06 ENCOUNTER — Ambulatory Visit: Payer: Medicaid Other | Admitting: Pharmacist

## 2021-07-09 ENCOUNTER — Telehealth: Payer: Self-pay | Admitting: Nurse Practitioner

## 2021-07-09 NOTE — Telephone Encounter (Signed)
Copied from [place] [id number]. Topic: General - Other >> Jul 09, 2021 11:25 AM Valere Dross wrote: Reason for CRM: Debbie from [facility] called in in regards to pt death certificate, stating they need it asap, and if it could be sent expedited. Please advise.

## 2021-07-09 DEATH — deceased

## 2021-07-10 NOTE — Telephone Encounter (Signed)
It has been signed as of today

## 2021-07-14 ENCOUNTER — Ambulatory Visit: Payer: Self-pay | Admitting: *Deleted

## 2021-07-14 NOTE — Telephone Encounter (Signed)
Death certificate will be signed off today.

## 2021-07-14 NOTE — Telephone Encounter (Signed)
Message from Bayard Beaver sent at Jul 29, 2021 11:09 AM EDT  Summary: death certificate   Debbie steele called from [facility] [employer] about death certificate received but it wasn't signed. Please call back         Maurice March # [phone]

## 2021-08-13 ENCOUNTER — Telehealth: Payer: Self-pay | Admitting: Physician Assistant

## 2021-08-13 NOTE — Telephone Encounter (Signed)
Called and left msg about change in time for 11/10 appt per provider request

## 2021-09-14 NOTE — Progress Notes (Deleted)
[facility] OFFICE PROGRESS NOTE  Leonard Pounds, NP [facility] [place] [address]  DIAGNOSIS:  1) Stage II, large B-cell non-Hodgkin's lymphoma diagnosed in September 2012 with a scalp lesion. In addition he had mediastinal lymphadenopathy.  2) bilateral pulmonary embolism diagnosed on CT scan of the chest on 12/28/2012.   PRIOR THERAPY: 1) Systemic chemotherapy with CHOP/Rituxan, given every 3 weeks with Neulasta support, status post 6 cycles, last dose was given 12/01/2011 with complete response.  2) Xarelto 20 mg by mouth a Distel.  CURRENT THERAPY: Observation  INTERVAL HISTORY: [facility] 60 y.o. male returns  to the clinic today for a follow-up visit.  The patient was last seen in the clinic 1 year ago.  The patient completed treatment for his non-Hodgkin's lymphoma in 2013.  He has been on observation since that time and feeling fine. He denies any recent fever, chills, night sweats, unexplained weight loss, or lymphadenopathy.  He denies any recent signs and symptoms of infection including nasal congestion, sore throat, cough, skin infections, or dysuria except he was treated for a UTI in September 2021.  The patient denies any abnormal bleeding or bruising.  He denies any abdominal pain or early satiety.  He is here today for evaluation and repeat blood work.   MEDICAL HISTORY: Past Medical History:  Diagnosis Date   Acute pulmonary embolism ([place]) 04/11/2016   Anxiety    Arthritis    Atrial tachycardia ([place]) 01/06/2016   Depression    DTs (delirium tremens) ([place])    Dyspnea    ED (erectile dysfunction)    ETOH abuse    Hypertension    Lymphoma, non Hodgkin's 09/25/2011   Stage 2   Mitral regurgitation 01/20/2016   Mobitz I 10/09/2015   Murmur 01/06/2016   Occasional tremors    PAC (premature atrial contraction) 10/09/2015   S/P minimally-invasive mitral valve repair 10/27/2016   Complex valvuloplasty including artificial Gore-tex neochord  placement x6 and 34 mm Edwards Physio II ring annuloplasty via right mini thoracotomy approach    ALLERGIES:  is allergic to other.  MEDICATIONS:  Current Outpatient Medications  Medication Sig Dispense Refill   ALPRAZolam (XANAX) 0.25 MG tablet Take 1 tablet (0.25 mg total) by mouth 2 (two) times daily as needed for anxiety. 60 tablet 0   atorvastatin (LIPITOR) 20 MG tablet Take 1 tablet (20 mg total) by mouth daily. 90 tablet 3   collagenase (SANTYL) ointment Apply 1 application topically daily. 90 g 0   gabapentin (NEURONTIN) 100 MG capsule Take 2 capsules (200 mg total) by mouth 3 (three) times daily. 180 capsule 2   metoprolol succinate (TOPROL-XL) 25 MG 24 hr tablet TAKE 1/2 TABLET(12.5 MG) BY MOUTH DAILY 45 tablet 1   nicotine (NICODERM CQ - DOSED IN MG/24 HOURS) 21 mg/24hr patch Place 1 patch (21 mg total) onto the skin daily. (Patient not taking: Reported on 09/17/2020) 28 patch 0   oxyCODONE (OXY IR/ROXICODONE) 5 MG immediate release tablet Take 1 tablet (5 mg total) by mouth every 6 (six) hours as needed for moderate pain (pain score 4-6). 15 tablet 0   warfarin (COUMADIN) 5 MG tablet TAKE AS DIRECTED BY COUMADIN CLINIC 60 tablet 2   No current facility-administered medications for this visit.    SURGICAL HISTORY:  Past Surgical History:  Procedure Laterality Date   AMPUTATION Left 01/12/2019   Procedure: LEFT GREAT TOE AMPUTATION;  Surgeon: [name], MD;  Location: [place];  Service:  Orthopedics;  Laterality: Left;   AMPUTATION Left 01/16/2020   Procedure: LEFT 1ST METATARSAL AND SECOND TOE AMPUTATION;  Surgeon: [name], MD;  Location: [place];  Service: Orthopedics;  Laterality: Left;   AMPUTATION Left 01/30/2020   Procedure: LEFT FOOT 3RD RAY AMPUTATION;  Surgeon: [name], MD;  Location: [place];  Service: Orthopedics;  Laterality: Left;   AMPUTATION Left 03/21/2020   Procedure: MIDFOOT AMPUTATION; LEFT;  Surgeon: [name], MD;  Location: [place];  Service:  Orthopedics;  Laterality: Left;   CARDIAC CATHETERIZATION N/A 08/13/2016   Procedure: Right/Left Heart Cath and Coronary Angiography;  Surgeon: [name], MD;  Location: [facility];  Service: Cardiovascular;  Laterality: N/A;   MITRAL VALVE REPAIR Right 10/27/2016   Procedure: MINIMALLY INVASIVE MITRAL VALVE REPAIR (MVR) USING 34 PHYSIO II ANNULOPLASTY RING;  Surgeon: [name], MD;  Location: [facility];  Service: Open Heart Surgery;  Laterality: Right;   SKIN BIOPSY Right 04/04/2018   right mid medial anterior tibial shave  see report in chart   TEE WITHOUT CARDIOVERSION N/A 02/11/2016   Procedure: TRANSESOPHAGEAL ECHOCARDIOGRAM (TEE);  Surgeon: [name], MD;  Location: [facility];  Service: Cardiovascular;  Laterality: N/A;   TEE WITHOUT CARDIOVERSION N/A 10/27/2016   Procedure: TRANSESOPHAGEAL ECHOCARDIOGRAM (TEE);  Surgeon: [name], MD;  Location: [place];  Service: Open Heart Surgery;  Laterality: N/A;   TRIGGER FINGER RELEASE Bilateral     REVIEW OF SYSTEMS:   Review of Systems  Constitutional: Negative for appetite change, chills, fatigue, fever and unexpected weight change.  HENT:   Negative for mouth sores, nosebleeds, sore throat and trouble swallowing.   Eyes: Negative for eye problems and icterus.  Respiratory: Negative for cough, hemoptysis, shortness of breath and wheezing.   Cardiovascular: Negative for chest pain and leg swelling.  Gastrointestinal: Negative for abdominal pain, constipation, diarrhea, nausea and vomiting.  Genitourinary: Negative for bladder incontinence, difficulty urinating, dysuria, frequency and hematuria.   Musculoskeletal: Negative for back pain, gait problem, neck pain and neck stiffness.  Skin: Negative for itching and rash.  Neurological: Negative for dizziness, extremity weakness, gait problem, headaches, light-headedness and seizures.  Hematological: Negative for adenopathy. Does not bruise/bleed easily.   Psychiatric/Behavioral: Negative for confusion, depression and sleep disturbance. The patient is not nervous/anxious.     PHYSICAL EXAMINATION:  There were no vitals taken for this visit.  ECOG PERFORMANCE STATUS: {CHL ONC ECOG [id number]  Physical Exam  Constitutional: Oriented to person, place, and time and well-developed, well-nourished, and in no distress. No distress.  HENT:  Head: Normocephalic and atraumatic.  Mouth/Throat: Oropharynx is clear and moist. No oropharyngeal exudate.  Eyes: Conjunctivae are normal. Right eye exhibits no discharge. Left eye exhibits no discharge. No scleral icterus.  Neck: Normal range of motion. Neck supple.  Cardiovascular: Normal rate, regular rhythm, normal heart sounds and intact distal pulses.   Pulmonary/Chest: Effort normal and breath sounds normal. No respiratory distress. No wheezes. No rales.  Abdominal: Soft. Bowel sounds are normal. Exhibits no distension and no mass. There is no tenderness.  Musculoskeletal: Normal range of motion. Exhibits no edema.  Lymphadenopathy:    No cervical adenopathy.  Neurological: Alert and oriented to person, place, and time. Exhibits normal muscle tone. Gait normal. Coordination normal.  Skin: Skin is warm and dry. No rash noted. Not diaphoretic. No erythema. No pallor.  Psychiatric: Mood, memory and judgment normal.  Vitals reviewed.  LABORATORY DATA: Lab Results  Component Value  Date   WBC 3.3 (L) 09/15/2020   HGB 11.6 (L) 09/15/2020   HCT 36.2 (L) 09/15/2020   MCV 76.1 (L) 09/15/2020   PLT 135 (L) 09/15/2020      Chemistry      Component Value Date/Time   NA 136 09/15/2020 0949   NA 138 08/03/2019 1121   NA 140 09/21/2017 0825   K 3.2 (L) 09/15/2020 0949   K 4.5 09/21/2017 0825   CL 101 09/15/2020 0949   CL 98 12/28/2012 0824   CO2 25 09/15/2020 0949   CO2 27 09/21/2017 0825   BUN 13 09/15/2020 0949   BUN 14 08/03/2019 1121   BUN 18.1 09/21/2017 0825   CREATININE 1.08  09/15/2020 0949   CREATININE 1.0 09/21/2017 0825      Component Value Date/Time   CALCIUM 9.1 09/15/2020 0949   CALCIUM 9.8 09/21/2017 0825   ALKPHOS 59 09/15/2020 0949   ALKPHOS 63 09/21/2017 0825   AST 23 09/15/2020 0949   AST 58 (H) 09/21/2017 0825   ALT 13 09/15/2020 0949   ALT 37 09/21/2017 0825   BILITOT 1.0 09/15/2020 0949   BILITOT 0.62 09/21/2017 0825       RADIOGRAPHIC STUDIES:  No results found.   ASSESSMENT/PLAN:  This is a very pleasant 60 year old African-American male with a history of stage II large B cell non-Hodgkin's lymphoma.  The patient was diagnosed in 2020.  He presented with a scalp lesion and mediastinal lymphadenopathy.   The patient is status post 6 cycles of R-CHOP which was completed in 2013.  The patient has been on observation since that time.  The patient was seen with Dr. Julien Nordmann today. Labs were reviewed ***.   Dr. Earlie Server recommends that the patient continue on observation with repeat blood work in 1 year.  The patient was advised to call immediately if he has any concerning symptoms in the interval. The patient voices understanding of current disease status and treatment options and is in agreement with the current care plan. All questions were answered. The patient knows to call the clinic with any problems, questions or concerns. We can certainly see the patient much sooner if necessary   No orders of the defined types were placed in this encounter.    I spent {CHL ONC TIME VISIT - BZJIR:[id number]} counseling the patient face to face. The total time spent in the appointment was {CHL ONC TIME VISIT - PZWCH:[id number]}.  Leonard Nomura L Enis Leatherwood, PA-C 09/14/21

## 2021-09-17 ENCOUNTER — Inpatient Hospital Stay: Payer: Medicaid Other | Attending: Nurse Practitioner

## 2021-09-17 ENCOUNTER — Ambulatory Visit: Payer: Medicaid Other | Admitting: Physician Assistant

## 2021-09-17 ENCOUNTER — Other Ambulatory Visit: Payer: Medicaid Other

## 2021-09-17 ENCOUNTER — Inpatient Hospital Stay: Payer: Medicaid Other | Admitting: Physician Assistant
